# Patient Record
Sex: Male | Born: 1950 | Race: Black or African American | Hispanic: No | Marital: Single | Smoking: Former smoker
Health system: Southern US, Community
[De-identification: ages and names within clinical notes are randomized; demographics above are authoritative.]

## PROBLEM LIST (undated history)

## (undated) ENCOUNTER — Emergency Department (HOSPITAL_COMMUNITY): Admission: EM | Payer: Medicare HMO | Source: Home / Self Care

## (undated) DIAGNOSIS — N183 Chronic kidney disease, stage 3 unspecified: Secondary | ICD-10-CM

## (undated) DIAGNOSIS — A499 Bacterial infection, unspecified: Secondary | ICD-10-CM

## (undated) DIAGNOSIS — I10 Essential (primary) hypertension: Secondary | ICD-10-CM

## (undated) DIAGNOSIS — K9423 Gastrostomy malfunction: Secondary | ICD-10-CM

## (undated) DIAGNOSIS — L89153 Pressure ulcer of sacral region, stage 3: Secondary | ICD-10-CM

## (undated) DIAGNOSIS — J690 Pneumonitis due to inhalation of food and vomit: Secondary | ICD-10-CM

## (undated) DIAGNOSIS — N189 Chronic kidney disease, unspecified: Secondary | ICD-10-CM

## (undated) DIAGNOSIS — Z1624 Resistance to multiple antibiotics: Secondary | ICD-10-CM

## (undated) DIAGNOSIS — N182 Chronic kidney disease, stage 2 (mild): Secondary | ICD-10-CM

## (undated) DIAGNOSIS — D631 Anemia in chronic kidney disease: Secondary | ICD-10-CM

## (undated) DIAGNOSIS — A048 Other specified bacterial intestinal infections: Secondary | ICD-10-CM

## (undated) DIAGNOSIS — A498 Other bacterial infections of unspecified site: Secondary | ICD-10-CM

## (undated) DIAGNOSIS — I639 Cerebral infarction, unspecified: Secondary | ICD-10-CM

## (undated) DIAGNOSIS — M6282 Rhabdomyolysis: Secondary | ICD-10-CM

## (undated) DIAGNOSIS — N390 Urinary tract infection, site not specified: Secondary | ICD-10-CM

## (undated) DIAGNOSIS — S270XXA Traumatic pneumothorax, initial encounter: Secondary | ICD-10-CM

## (undated) DIAGNOSIS — J189 Pneumonia, unspecified organism: Secondary | ICD-10-CM

## (undated) DIAGNOSIS — J810 Acute pulmonary edema: Secondary | ICD-10-CM

## (undated) DIAGNOSIS — Z931 Gastrostomy status: Secondary | ICD-10-CM

## (undated) DIAGNOSIS — Z86010 Personal history of colonic polyps: Secondary | ICD-10-CM

## (undated) DIAGNOSIS — N289 Disorder of kidney and ureter, unspecified: Secondary | ICD-10-CM

## (undated) DIAGNOSIS — N179 Acute kidney failure, unspecified: Secondary | ICD-10-CM

## (undated) DIAGNOSIS — K9429 Other complications of gastrostomy: Secondary | ICD-10-CM

## (undated) HISTORY — PX: KNEE SURGERY: SHX244

## (undated) HISTORY — PX: TRACHEOSTOMY TUBE PLACEMENT: SHX814

---

## 2012-09-21 DIAGNOSIS — A048 Other specified bacterial intestinal infections: Secondary | ICD-10-CM | POA: Insufficient documentation

## 2012-09-21 HISTORY — DX: Other specified bacterial intestinal infections: A04.8

## 2013-07-28 DIAGNOSIS — I10 Essential (primary) hypertension: Secondary | ICD-10-CM

## 2013-07-28 DIAGNOSIS — E782 Mixed hyperlipidemia: Secondary | ICD-10-CM | POA: Insufficient documentation

## 2014-12-23 DIAGNOSIS — Z720 Tobacco use: Secondary | ICD-10-CM

## 2015-07-28 DIAGNOSIS — Z860101 Personal history of adenomatous and serrated colon polyps: Secondary | ICD-10-CM

## 2015-07-28 DIAGNOSIS — Z86010 Personal history of colonic polyps: Secondary | ICD-10-CM

## 2015-07-28 HISTORY — DX: Personal history of colonic polyps: Z86.010

## 2015-07-28 HISTORY — DX: Personal history of adenomatous and serrated colon polyps: Z86.0101

## 2016-01-07 DIAGNOSIS — G8929 Other chronic pain: Secondary | ICD-10-CM | POA: Insufficient documentation

## 2016-01-07 DIAGNOSIS — M25562 Pain in left knee: Secondary | ICD-10-CM

## 2016-01-07 DIAGNOSIS — M25561 Pain in right knee: Secondary | ICD-10-CM

## 2016-10-15 DIAGNOSIS — M6282 Rhabdomyolysis: Secondary | ICD-10-CM

## 2016-10-15 HISTORY — DX: Rhabdomyolysis: M62.82

## 2016-10-19 ENCOUNTER — Encounter (HOSPITAL_COMMUNITY): Payer: Self-pay | Admitting: Internal Medicine

## 2016-10-19 ENCOUNTER — Inpatient Hospital Stay (HOSPITAL_COMMUNITY)
Admission: AD | Admit: 2016-10-19 | Discharge: 2016-11-29 | DRG: 004 | Disposition: A | Discharge status: Skilled Nursing Facility | Payer: Medicare HMO | Source: Other Acute Inpatient Hospital | Attending: Family Medicine | Admitting: Family Medicine

## 2016-10-19 ENCOUNTER — Inpatient Hospital Stay (HOSPITAL_COMMUNITY): Payer: Medicare HMO

## 2016-10-19 DIAGNOSIS — I6350 Cerebral infarction due to unspecified occlusion or stenosis of unspecified cerebral artery: Secondary | ICD-10-CM

## 2016-10-19 DIAGNOSIS — R001 Bradycardia, unspecified: Secondary | ICD-10-CM | POA: Diagnosis not present

## 2016-10-19 DIAGNOSIS — R471 Dysarthria and anarthria: Secondary | ICD-10-CM | POA: Diagnosis present

## 2016-10-19 DIAGNOSIS — Z23 Encounter for immunization: Secondary | ICD-10-CM | POA: Diagnosis present

## 2016-10-19 DIAGNOSIS — J9600 Acute respiratory failure, unspecified whether with hypoxia or hypercapnia: Secondary | ICD-10-CM

## 2016-10-19 DIAGNOSIS — G9349 Other encephalopathy: Secondary | ICD-10-CM | POA: Diagnosis present

## 2016-10-19 DIAGNOSIS — R0682 Tachypnea, not elsewhere classified: Secondary | ICD-10-CM

## 2016-10-19 DIAGNOSIS — Z9911 Dependence on respirator [ventilator] status: Secondary | ICD-10-CM | POA: Diagnosis not present

## 2016-10-19 DIAGNOSIS — L8990 Pressure ulcer of unspecified site, unspecified stage: Secondary | ICD-10-CM | POA: Insufficient documentation

## 2016-10-19 DIAGNOSIS — D62 Acute posthemorrhagic anemia: Secondary | ICD-10-CM | POA: Diagnosis not present

## 2016-10-19 DIAGNOSIS — D473 Essential (hemorrhagic) thrombocythemia: Secondary | ICD-10-CM | POA: Diagnosis not present

## 2016-10-19 DIAGNOSIS — R509 Fever, unspecified: Secondary | ICD-10-CM | POA: Diagnosis not present

## 2016-10-19 DIAGNOSIS — I63531 Cerebral infarction due to unspecified occlusion or stenosis of right posterior cerebral artery: Secondary | ICD-10-CM | POA: Diagnosis not present

## 2016-10-19 DIAGNOSIS — D72829 Elevated white blood cell count, unspecified: Secondary | ICD-10-CM | POA: Diagnosis not present

## 2016-10-19 DIAGNOSIS — I10 Essential (primary) hypertension: Secondary | ICD-10-CM | POA: Diagnosis present

## 2016-10-19 DIAGNOSIS — T17908A Unspecified foreign body in respiratory tract, part unspecified causing other injury, initial encounter: Secondary | ICD-10-CM

## 2016-10-19 DIAGNOSIS — I253 Aneurysm of heart: Secondary | ICD-10-CM | POA: Diagnosis present

## 2016-10-19 DIAGNOSIS — R6521 Severe sepsis with septic shock: Secondary | ICD-10-CM | POA: Diagnosis not present

## 2016-10-19 DIAGNOSIS — E1122 Type 2 diabetes mellitus with diabetic chronic kidney disease: Secondary | ICD-10-CM | POA: Diagnosis present

## 2016-10-19 DIAGNOSIS — A419 Sepsis, unspecified organism: Secondary | ICD-10-CM | POA: Diagnosis not present

## 2016-10-19 DIAGNOSIS — J690 Pneumonitis due to inhalation of food and vomit: Secondary | ICD-10-CM | POA: Diagnosis present

## 2016-10-19 DIAGNOSIS — R1310 Dysphagia, unspecified: Secondary | ICD-10-CM | POA: Diagnosis present

## 2016-10-19 DIAGNOSIS — E870 Hyperosmolality and hypernatremia: Secondary | ICD-10-CM | POA: Diagnosis present

## 2016-10-19 DIAGNOSIS — E119 Type 2 diabetes mellitus without complications: Secondary | ICD-10-CM

## 2016-10-19 DIAGNOSIS — F1020 Alcohol dependence, uncomplicated: Secondary | ICD-10-CM | POA: Diagnosis present

## 2016-10-19 DIAGNOSIS — R7989 Other specified abnormal findings of blood chemistry: Secondary | ICD-10-CM | POA: Diagnosis not present

## 2016-10-19 DIAGNOSIS — R000 Tachycardia, unspecified: Secondary | ICD-10-CM | POA: Diagnosis not present

## 2016-10-19 DIAGNOSIS — I6340 Cerebral infarction due to embolism of unspecified cerebral artery: Secondary | ICD-10-CM | POA: Diagnosis not present

## 2016-10-19 DIAGNOSIS — I63532 Cerebral infarction due to unspecified occlusion or stenosis of left posterior cerebral artery: Secondary | ICD-10-CM | POA: Diagnosis not present

## 2016-10-19 DIAGNOSIS — N183 Chronic kidney disease, stage 3 (moderate): Secondary | ICD-10-CM | POA: Diagnosis present

## 2016-10-19 DIAGNOSIS — R1110 Vomiting, unspecified: Secondary | ICD-10-CM

## 2016-10-19 DIAGNOSIS — I639 Cerebral infarction, unspecified: Secondary | ICD-10-CM | POA: Diagnosis present

## 2016-10-19 DIAGNOSIS — Z7189 Other specified counseling: Secondary | ICD-10-CM | POA: Diagnosis not present

## 2016-10-19 DIAGNOSIS — R739 Hyperglycemia, unspecified: Secondary | ICD-10-CM | POA: Diagnosis not present

## 2016-10-19 DIAGNOSIS — E1165 Type 2 diabetes mellitus with hyperglycemia: Secondary | ICD-10-CM | POA: Diagnosis present

## 2016-10-19 DIAGNOSIS — J9601 Acute respiratory failure with hypoxia: Secondary | ICD-10-CM | POA: Diagnosis not present

## 2016-10-19 DIAGNOSIS — K7200 Acute and subacute hepatic failure without coma: Secondary | ICD-10-CM | POA: Diagnosis not present

## 2016-10-19 DIAGNOSIS — R0603 Acute respiratory distress: Secondary | ICD-10-CM

## 2016-10-19 DIAGNOSIS — Z8673 Personal history of transient ischemic attack (TIA), and cerebral infarction without residual deficits: Secondary | ICD-10-CM | POA: Diagnosis present

## 2016-10-19 DIAGNOSIS — N182 Chronic kidney disease, stage 2 (mild): Secondary | ICD-10-CM

## 2016-10-19 DIAGNOSIS — N179 Acute kidney failure, unspecified: Secondary | ICD-10-CM | POA: Diagnosis present

## 2016-10-19 DIAGNOSIS — J189 Pneumonia, unspecified organism: Secondary | ICD-10-CM

## 2016-10-19 DIAGNOSIS — R0602 Shortness of breath: Secondary | ICD-10-CM

## 2016-10-19 DIAGNOSIS — G9341 Metabolic encephalopathy: Secondary | ICD-10-CM | POA: Diagnosis not present

## 2016-10-19 DIAGNOSIS — G8194 Hemiplegia, unspecified affecting left nondominant side: Secondary | ICD-10-CM | POA: Diagnosis present

## 2016-10-19 DIAGNOSIS — R29723 NIHSS score 23: Secondary | ICD-10-CM | POA: Diagnosis not present

## 2016-10-19 DIAGNOSIS — M6282 Rhabdomyolysis: Secondary | ICD-10-CM

## 2016-10-19 DIAGNOSIS — Z833 Family history of diabetes mellitus: Secondary | ICD-10-CM | POA: Diagnosis not present

## 2016-10-19 DIAGNOSIS — I771 Stricture of artery: Secondary | ICD-10-CM | POA: Diagnosis present

## 2016-10-19 DIAGNOSIS — D638 Anemia in other chronic diseases classified elsewhere: Secondary | ICD-10-CM | POA: Diagnosis present

## 2016-10-19 DIAGNOSIS — R6520 Severe sepsis without septic shock: Secondary | ICD-10-CM | POA: Diagnosis not present

## 2016-10-19 DIAGNOSIS — I69391 Dysphagia following cerebral infarction: Secondary | ICD-10-CM | POA: Diagnosis not present

## 2016-10-19 DIAGNOSIS — R609 Edema, unspecified: Secondary | ICD-10-CM

## 2016-10-19 DIAGNOSIS — Z9289 Personal history of other medical treatment: Secondary | ICD-10-CM

## 2016-10-19 DIAGNOSIS — Y95 Nosocomial condition: Secondary | ICD-10-CM | POA: Diagnosis present

## 2016-10-19 DIAGNOSIS — Z01818 Encounter for other preprocedural examination: Secondary | ICD-10-CM

## 2016-10-19 DIAGNOSIS — J9690 Respiratory failure, unspecified, unspecified whether with hypoxia or hypercapnia: Secondary | ICD-10-CM

## 2016-10-19 DIAGNOSIS — Z515 Encounter for palliative care: Secondary | ICD-10-CM

## 2016-10-19 DIAGNOSIS — Z452 Encounter for adjustment and management of vascular access device: Secondary | ICD-10-CM

## 2016-10-19 DIAGNOSIS — E784 Other hyperlipidemia: Secondary | ICD-10-CM | POA: Diagnosis not present

## 2016-10-19 DIAGNOSIS — E43 Unspecified severe protein-calorie malnutrition: Secondary | ICD-10-CM | POA: Diagnosis present

## 2016-10-19 DIAGNOSIS — Z7401 Bed confinement status: Secondary | ICD-10-CM

## 2016-10-19 DIAGNOSIS — R740 Nonspecific elevation of levels of transaminase and lactic acid dehydrogenase [LDH]: Secondary | ICD-10-CM | POA: Diagnosis present

## 2016-10-19 DIAGNOSIS — K5900 Constipation, unspecified: Secondary | ICD-10-CM

## 2016-10-19 DIAGNOSIS — L89312 Pressure ulcer of right buttock, stage 2: Secondary | ICD-10-CM | POA: Diagnosis present

## 2016-10-19 DIAGNOSIS — K567 Ileus, unspecified: Secondary | ICD-10-CM

## 2016-10-19 DIAGNOSIS — I129 Hypertensive chronic kidney disease with stage 1 through stage 4 chronic kidney disease, or unspecified chronic kidney disease: Secondary | ICD-10-CM | POA: Diagnosis present

## 2016-10-19 DIAGNOSIS — R410 Disorientation, unspecified: Secondary | ICD-10-CM | POA: Diagnosis present

## 2016-10-19 DIAGNOSIS — I6502 Occlusion and stenosis of left vertebral artery: Secondary | ICD-10-CM | POA: Diagnosis present

## 2016-10-19 DIAGNOSIS — R54 Age-related physical debility: Secondary | ICD-10-CM | POA: Diagnosis present

## 2016-10-19 DIAGNOSIS — K219 Gastro-esophageal reflux disease without esophagitis: Secondary | ICD-10-CM | POA: Diagnosis present

## 2016-10-19 DIAGNOSIS — E785 Hyperlipidemia, unspecified: Secondary | ICD-10-CM | POA: Diagnosis present

## 2016-10-19 DIAGNOSIS — E860 Dehydration: Secondary | ICD-10-CM | POA: Diagnosis not present

## 2016-10-19 DIAGNOSIS — Z720 Tobacco use: Secondary | ICD-10-CM

## 2016-10-19 DIAGNOSIS — I6312 Cerebral infarction due to embolism of basilar artery: Secondary | ICD-10-CM | POA: Diagnosis not present

## 2016-10-19 DIAGNOSIS — I638 Other cerebral infarction: Secondary | ICD-10-CM | POA: Diagnosis not present

## 2016-10-19 DIAGNOSIS — F17200 Nicotine dependence, unspecified, uncomplicated: Secondary | ICD-10-CM | POA: Diagnosis present

## 2016-10-19 HISTORY — DX: Cerebral infarction, unspecified: I63.9

## 2016-10-19 HISTORY — DX: Acute kidney failure, unspecified: N17.9

## 2016-10-19 HISTORY — DX: Essential (primary) hypertension: I10

## 2016-10-19 HISTORY — DX: Rhabdomyolysis: M62.82

## 2016-10-19 LAB — COMPREHENSIVE METABOLIC PANEL
ALT: 47 U/L (ref 17–63)
AST: 57 U/L — ABNORMAL HIGH (ref 15–41)
Albumin: 2.1 g/dL — ABNORMAL LOW (ref 3.5–5.0)
Alkaline Phosphatase: 77 U/L (ref 38–126)
Anion gap: 8 (ref 5–15)
BUN: 17 mg/dL (ref 6–20)
CO2: 21 mmol/L — ABNORMAL LOW (ref 22–32)
Calcium: 9.2 mg/dL (ref 8.9–10.3)
Chloride: 109 mmol/L (ref 101–111)
Creatinine, Ser: 1.8 mg/dL — ABNORMAL HIGH (ref 0.61–1.24)
GFR calc Af Amer: 44 mL/min — ABNORMAL LOW (ref >60)
GFR calc non Af Amer: 38 mL/min — ABNORMAL LOW (ref >60)
Glucose, Bld: 96 mg/dL (ref 65–99)
Potassium: 3.8 mmol/L (ref 3.5–5.1)
Sodium: 138 mmol/L (ref 135–145)
Total Bilirubin: 1.8 mg/dL — ABNORMAL HIGH (ref 0.3–1.2)
Total Protein: 6.3 g/dL — ABNORMAL LOW (ref 6.5–8.1)

## 2016-10-19 LAB — LIPID PANEL
Cholesterol: 83 mg/dL (ref 0–200)
HDL: 24 mg/dL — ABNORMAL LOW (ref >40)
LDL Cholesterol: 41 mg/dL (ref 0–99)
Total CHOL/HDL Ratio: 3.5 RATIO
Triglycerides: 89 mg/dL (ref <150)
VLDL: 18 mg/dL (ref 0–40)

## 2016-10-19 LAB — CBC WITH DIFFERENTIAL/PLATELET
Basophils Absolute: 0 10*3/uL (ref 0.0–0.1)
Basophils Relative: 0 %
Eosinophils Absolute: 0.1 10*3/uL (ref 0.0–0.7)
Eosinophils Relative: 0 %
HCT: 32.2 % — ABNORMAL LOW (ref 39.0–52.0)
Hemoglobin: 10.6 g/dL — ABNORMAL LOW (ref 13.0–17.0)
Lymphocytes Relative: 8 %
Lymphs Abs: 1.5 10*3/uL (ref 0.7–4.0)
MCH: 29 pg (ref 26.0–34.0)
MCHC: 32.9 g/dL (ref 30.0–36.0)
MCV: 88 fL (ref 78.0–100.0)
Monocytes Absolute: 2.1 10*3/uL — ABNORMAL HIGH (ref 0.1–1.0)
Monocytes Relative: 11 %
Neutro Abs: 14.9 10*3/uL — ABNORMAL HIGH (ref 1.7–7.7)
Neutrophils Relative %: 81 %
Platelets: 239 10*3/uL (ref 150–400)
RBC: 3.66 MIL/uL — ABNORMAL LOW (ref 4.22–5.81)
RDW: 15.2 % (ref 11.5–15.5)
WBC: 18.5 10*3/uL — ABNORMAL HIGH (ref 4.0–10.5)

## 2016-10-19 LAB — CK: Total CK: 199 U/L (ref 49–397)

## 2016-10-19 MED ORDER — HYDRALAZINE HCL 20 MG/ML IJ SOLN: 10.0000 mg | INTRAMUSCULAR | Status: DC | PRN

## 2016-10-19 MED ORDER — ASPIRIN 325 MG PO TABS: 325.0000 mg | ORAL_TABLET | Freq: Every day | ORAL | Status: DC

## 2016-10-19 MED ORDER — SODIUM CHLORIDE 0.9 % IV SOLN
INTRAVENOUS | Status: DC
  Administered 2016-10-19: 21:00:00 via INTRAVENOUS

## 2016-10-19 MED ORDER — DEXTROSE 5 % IV SOLN
2.0000 g | Freq: Once | INTRAVENOUS | Status: DC
  Filled 2016-10-19: qty 2

## 2016-10-19 MED ORDER — ACETAMINOPHEN 650 MG RE SUPP
650.0000 mg | RECTAL | Status: DC | PRN
  Administered 2016-10-29 – 2016-11-06 (×2): 650 mg via RECTAL
  Filled 2016-10-19 (×3): qty 1

## 2016-10-19 MED ORDER — CEFEPIME HCL 1 G IJ SOLR
1.0000 g | Freq: Three times a day (TID) | INTRAMUSCULAR | Status: DC
  Administered 2016-10-19: 1 g via INTRAVENOUS
  Filled 2016-10-19 (×3): qty 1

## 2016-10-19 MED ORDER — ACETAMINOPHEN 160 MG/5ML PO SOLN
650.0000 mg | ORAL | Status: DC | PRN
  Administered 2016-10-21 – 2016-11-26 (×13): 650 mg
  Filled 2016-10-19 (×16): qty 20.3

## 2016-10-19 MED ORDER — VANCOMYCIN HCL 10 G IV SOLR
1500.0000 mg | Freq: Once | INTRAVENOUS | Status: DC
  Filled 2016-10-19: qty 1500

## 2016-10-19 MED ORDER — VANCOMYCIN HCL IN DEXTROSE 750-5 MG/150ML-% IV SOLN
750.0000 mg | Freq: Two times a day (BID) | INTRAVENOUS | Status: DC
  Administered 2016-10-19: 750 mg via INTRAVENOUS
  Filled 2016-10-19 (×2): qty 150

## 2016-10-19 MED ORDER — STROKE: EARLY STAGES OF RECOVERY BOOK
Freq: Once | Status: DC
  Filled 2016-10-19: qty 1

## 2016-10-19 MED ORDER — ACETAMINOPHEN 325 MG PO TABS: 650.0000 mg | ORAL_TABLET | ORAL | Status: DC | PRN

## 2016-10-19 MED ORDER — ASPIRIN 300 MG RE SUPP
300.0000 mg | Freq: Every day | RECTAL | Status: DC
  Administered 2016-10-19 – 2016-10-20 (×2): 300 mg via RECTAL
  Filled 2016-10-19: qty 1
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
## 2016-10-19 NOTE — Progress Notes (Signed)
Pharmacy Antibiotic Note  Scott Rivera is a 66 y.o. male transferred from [facility] on 10/19/2016 with PNA.  Pharmacy has been consulted for vancomycin and cefepime dosing.  Spoke to pharmacist at [facility], patient received Zosyn 3.375gm IV at 1440 and vancomycin 1gm IV at 0900 today.  His SCr is 1.48, estimated CrCL ~51 ml/min.   Plan: - Vanc 750mg  IV Q12H - Cefepime 1gm IV Q8H - Monitor renal fxn, clinical progress, vanc trough as indicated   Height: 5\' 11"  (180.3 cm) Weight: 162 lb 4.1 oz (73.6 kg) IBW/kg (Calculated) : 75.3  Temp (24hrs), Avg:98.5 F (36.9 C), Min:98.5 F (36.9 C), Max:98.5 F (36.9 C)  No results for input(s): WBC, CREATININE, LATICACIDVEN, VANCOTROUGH, VANCOPEAK, VANCORANDOM, GENTTROUGH, GENTPEAK, GENTRANDOM, TOBRATROUGH, TOBRAPEAK, TOBRARND, AMIKACINPEAK, AMIKACINTROU, AMIKACIN in the last 168 hours.  CrCl cannot be calculated (No order found.).    No Known Allergies   Vanc 7/5 >> Cefepime 7/5 >>    Ajwa Kimberley D. Mina Marble, PharmD, BCPS Pager:  [phone] 10/19/2016, 9:23 PM

## 2016-10-20 ENCOUNTER — Inpatient Hospital Stay (HOSPITAL_COMMUNITY): Payer: Medicare HMO

## 2016-10-20 DIAGNOSIS — L8990 Pressure ulcer of unspecified site, unspecified stage: Secondary | ICD-10-CM | POA: Insufficient documentation

## 2016-10-20 LAB — URINALYSIS, MICROSCOPIC (REFLEX)
Squamous Epithelial / LPF: NONE SEEN
WBC, UA: NONE SEEN WBC/hpf (ref 0–5)

## 2016-10-20 LAB — MRSA PCR SCREENING: MRSA by PCR: NEGATIVE

## 2016-10-20 LAB — RAPID URINE DRUG SCREEN, HOSP PERFORMED
Amphetamines: NOT DETECTED
Barbiturates: NOT DETECTED
Benzodiazepines: NOT DETECTED
Cocaine: NOT DETECTED
Opiates: NOT DETECTED
Tetrahydrocannabinol: NOT DETECTED

## 2016-10-20 LAB — GLUCOSE, CAPILLARY
Glucose-Capillary: 82 mg/dL (ref 65–99)
Glucose-Capillary: 104 mg/dL — ABNORMAL HIGH (ref 65–99)
Glucose-Capillary: 120 mg/dL — ABNORMAL HIGH (ref 65–99)

## 2016-10-20 LAB — URINALYSIS, ROUTINE W REFLEX MICROSCOPIC
Bilirubin Urine: NEGATIVE
Glucose, UA: NEGATIVE mg/dL
Ketones, ur: NEGATIVE mg/dL
Leukocytes, UA: NEGATIVE
Nitrite: NEGATIVE
Protein, ur: 30 mg/dL — AB
Specific Gravity, Urine: 1.013 (ref 1.005–1.030)
pH: 5 (ref 5.0–8.0)

## 2016-10-20 MED ORDER — CHLORHEXIDINE GLUCONATE 0.12 % MT SOLN
15.0000 mL | Freq: Two times a day (BID) | OROMUCOSAL | Status: DC
  Administered 2016-10-20 – 2016-10-28 (×17): 15 mL via OROMUCOSAL
  Filled 2016-10-20 (×16): qty 15

## 2016-10-20 MED ORDER — WHITE PETROLATUM GEL
Status: AC
  Administered 2016-10-20: 04:00:00
  Filled 2016-10-20: qty 1

## 2016-10-20 MED ORDER — WHITE PETROLATUM GEL
Status: AC
  Administered 2016-10-20: 1
  Filled 2016-10-20: qty 1

## 2016-10-20 MED ORDER — PNEUMOCOCCAL VAC POLYVALENT 25 MCG/0.5ML IJ INJ
0.5000 mL | INJECTION | INTRAMUSCULAR | Status: AC
Start: 2016-10-21 — End: 2016-10-21
  Administered 2016-10-21: 0.5 mL via INTRAMUSCULAR
  Filled 2016-10-20: qty 0.5

## 2016-10-20 MED ORDER — JEVITY 1.2 CAL PO LIQD
1000.0000 mL | ORAL | Status: DC
  Administered 2016-10-20 – 2016-10-28 (×4): 1000 mL
  Filled 2016-10-20 (×20): qty 1000

## 2016-10-20 MED ORDER — THIAMINE HCL 100 MG/ML IJ SOLN
100.0000 mg | Freq: Every day | INTRAMUSCULAR | Status: DC
  Administered 2016-10-20 – 2016-10-25 (×6): 100 mg via INTRAVENOUS
  Administered 2016-10-26: 200 mg via INTRAVENOUS
  Administered 2016-10-27 – 2016-11-06 (×11): 100 mg via INTRAVENOUS
  Filled 2016-10-20 (×3): qty 2
  Filled 2016-10-20 (×2): qty 1
  Filled 2016-10-20: qty 2
  Filled 2016-10-20 (×3): qty 1
  Filled 2016-10-20 (×3): qty 2
  Filled 2016-10-20 (×3): qty 1
  Filled 2016-10-20: qty 2
  Filled 2016-10-20: qty 1
  Filled 2016-10-20: qty 2

## 2016-10-20 MED ORDER — SODIUM CHLORIDE 0.9 % IV SOLN
INTRAVENOUS | Status: DC
  Administered 2016-10-21: 03:00:00 via INTRAVENOUS

## 2016-10-20 MED ORDER — ORAL CARE MOUTH RINSE
15.0000 mL | Freq: Two times a day (BID) | OROMUCOSAL | Status: DC
  Administered 2016-10-20 – 2016-10-28 (×16): 15 mL via OROMUCOSAL

## 2016-10-20 MED ORDER — IOPAMIDOL (ISOVUE-370) INJECTION 76%
INTRAVENOUS | Status: AC
  Filled 2016-10-20: qty 50

## 2016-10-20 MED ORDER — NICOTINE 14 MG/24HR TD PT24
14.0000 mg | MEDICATED_PATCH | Freq: Every day | TRANSDERMAL | Status: DC
  Administered 2016-10-20 – 2016-11-28 (×39): 14 mg via TRANSDERMAL
  Filled 2016-10-20 (×40): qty 1

## 2016-10-20 MED ORDER — DEXTROSE 5 % IV SOLN
1.0000 g | Freq: Two times a day (BID) | INTRAVENOUS | Status: DC
  Administered 2016-10-20 – 2016-10-24 (×9): 1 g via INTRAVENOUS
  Filled 2016-10-20 (×11): qty 1

## 2016-10-20 MED ORDER — IOPAMIDOL (ISOVUE-370) INJECTION 76%
INTRAVENOUS | Status: AC
  Administered 2016-10-20: 50 mL
  Filled 2016-10-20: qty 50

## 2016-10-20 MED ORDER — ASPIRIN 325 MG PO TABS
325.0000 mg | ORAL_TABLET | Freq: Every day | ORAL | Status: DC
  Administered 2016-10-21 – 2016-11-01 (×11): 325 mg
  Filled 2016-10-20 (×13): qty 1
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
## 2016-10-20 NOTE — Progress Notes (Signed)
Pharmacy Antibiotic Note  Scott Rivera is a 66 y.o. male transferred from [facility] on 10/19/2016 with PNA.  Pharmacy has been consulted for vancomycin and cefepime dosing.  Spoke to pharmacist at [facility], patient received Zosyn 3.375gm IV at 1440 and vancomycin 1gm IV at 0900 today.  Renal function decreased.  Plan: - Vanc 750mg  IV Q12H - adjust Cefepime 1gm IV Q12h - Monitor renal fxn and adj Vanc if worsens, clinical progress, vanc trough as indicated - f/u labs in AM, CXR   Height: 5\' 11"  (180.3 cm) Weight: 162 lb 4.1 oz (73.6 kg) IBW/kg (Calculated) : 75.3  Temp (24hrs), Avg:99.3 F (37.4 C), Min:98.5 F (36.9 C), Max:100.5 F (38.1 C)   Recent Labs Lab 10/19/16 2157  WBC 18.5*  CREATININE 1.80*    Estimated Creatinine Clearance: 42 mL/min (A) (by C-G formula based on SCr of 1.8 mg/dL (H)).    No Known Allergies   Vanc 7/5 >> Cefepime 7/5 >>    Bertis Ruddy, PharmD Pharmacy Resident Pager #: [phone] 10/20/2016 10:50 AM
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
## 2016-10-20 NOTE — Progress Notes (Signed)
   Introduced chaplaincy services.  Will follow, as needed.  

## 2016-10-20 NOTE — Progress Notes (Addendum)
Initial Nutrition Assessment  DOCUMENTATION CODES:   Severe malnutrition in context of acute illness/injury  INTERVENTION:    Jevity 1.2 at 25 ml/h, increase by 10 ml every 4 hour to goal rate of 75 ml/h (1800 ml per day)  Provides 2160 kcal, 100 gm protein, 1458 ml free water daily  NUTRITION DIAGNOSIS:   Malnutrition related to acute illness as evidenced by severe depletion of body fat, severe depletion of muscle mass, energy intake < or equal to 50% for > or equal to 5 days.  GOAL:   Patient will meet greater than or equal to 90% of their needs  MONITOR:   TF tolerance, Labs, I & O's  REASON FOR ASSESSMENT:   Consult Enteral/tube feeding initiation and management  ASSESSMENT:   66 yo male with PMH of HTN, stroke who was admitted to [place] on 7/1 after family found him on the floor confused. MRI showed multiple acute infarcts. He was transferred to [facility] on 7/5 at family's request.   Patient unable to provide much information, but family member at bedside reports that patient was down on the floor for 4-5 days before the family found him. He was a good eater prior to that time. He was on a pureed diet for 1 day at [facility], then he was made NPO because he failed his swallow evaluation. He has essentially been NPO for 10 days.   S/P bedside swallow evaluation with SLP this morning, patient with profound dysphagia.  Cortrak in place with tip in the duodenum. Received MD Consult for TF initiation and management.   Nutrition-Focused physical exam completed. Findings are severe fat depletion, severe muscle depletion, and mild edema. Usual weight unknown.  Diet Order:  Diet NPO time specified  Skin:  Wound (see comment) (stage II to R buttocks, L shoulder; DTI to heels)  Last BM:  7/5  Height:   Ht Readings from Last 1 Encounters:  10/19/16 5\' 11"  (1.803 m)    Weight:   Wt Readings from Last 1 Encounters:  10/19/16 162 lb 4.1 oz (73.6 kg)    Ideal Body Weight:   78.2 kg  BMI:  Body mass index is 22.63 kg/m.  Estimated Nutritional Needs:   Kcal:  2000-2200  Protein:  100-120 gm  Fluid:  2-2.2 L  EDUCATION NEEDS:   Education needs addressed  Molli Barrows, [place], [place], [place] Pager [phone] After Hours Pager [phone]

## 2016-10-20 NOTE — Progress Notes (Signed)
Approved by Dr. Lars Pinks to give 5mL isovue 370 for CTA head/neck.
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
## 2016-10-20 NOTE — Progress Notes (Signed)
[place] TEAM 1 - Stepdown/ICU TEAM  BAUER AUSBORN  RJJ:[id number] DOB: 1950/11/04 DOA: 10/19/2016 PCP: Patient, No Pcp Per    Brief Narrative:  66 y.o. male with history of HTN who presented to [facility] on 10/15/2016 after family found him on the floor confused. MRI showed multiple acute infarcts involving both hemispheres of the cerebellum, the right occipital lobe. right midbrain, and left thalamus.  The fourth ventricle was patent/no hydrocephalus. TTE noted EF 55-60% w/ normal LV size. Carotid doppler showed right ICA 40-59% stenosis with left 60-79% stenosis. CXR noted infiltrates and patient was febrile therefore the pt was started on antibiotics. Creatinine at admission was 2.6, but this improved to 1.5.  CK of 7000 was noted at the time of admission. Patient's mentation slowly improved.  Family requested transfer to [facility], and this was accomplished on 7/5.  Subjective: The patient is resting in bed with the nutrition team placing a cor tract feeding tube at the time of visit.  He appears to be comfortable.  There is no evidence of respiratory distress.   Assessment & Plan:  Acute stroke Care per neurology/stroke team  Extensive left lung Aspiration pneumonia with possible mucous plugging Continue empiric antibiotic therapy - nebs as needed - chest PT  Dysphagia SLP following - not cleared for intake at this time - place Cortrak - begin tube feeding when patient returns from radiology today  HTN Blood pressure well controlled at present  Rhabdomyolysis CK normalized  Acute renal failure Creatinine peaked at 2.6 - keep hydrated especially with planned IV contrast for today's CT scans  Recent Labs Lab 10/19/16 2157  CREATININE 1.80*    History of alcoholism  Tobacco abuse  DVT prophylaxis: SCDs Code Status: FULL CODE Family Communication: no family present at time of exam  Disposition Plan: SDU  Consultants:   Neurology  Procedures: none  Antimicrobials:  Cefepime 7/4 > Vancomycin 7/5 > 7/6  Objective: Blood pressure (!) 119/55, pulse (!) 106, temperature 99.2 F (37.3 C), temperature source Oral, resp. rate (!) 24, height 5\' 11"  (1.803 m), weight 73.6 kg (162 lb 4.1 oz), SpO2 92 %.  Intake/Output Summary (Last 24 hours) at 10/20/16 1108 Last data filed at 10/20/16 0330  Gross per 24 hour  Intake           131.25 ml  Output              700 ml  Net          -568.75 ml   Filed Weights   10/19/16 2100  Weight: 73.6 kg (162 lb 4.1 oz)    Examination: General: No acute respiratory distress Lungs: Clear to auscultation bilaterally without wheezes or crackles Cardiovascular: Tachycardic but regular without appreciable murmur or rub Abdomen: Nondistended, soft, bowel sounds positive, no rebound, no ascites, no appreciable mass Extremities: No significant cyanosis, clubbing, or edema bilateral lower extremities  CBC:  Recent Labs Lab 10/19/16 2157  WBC 18.5*  NEUTROABS 14.9*  HGB 10.6*  HCT 32.2*  MCV 88.0  PLT 016   Basic Metabolic Panel:  Recent Labs Lab 10/19/16 2157  NA 138  K 3.8  CL 109  CO2 21*  GLUCOSE 96  BUN 17  CREATININE 1.80*  CALCIUM 9.2   GFR: Estimated Creatinine Clearance: 42 mL/min (A) (by C-G formula based on SCr of 1.8 mg/dL (H)).  Liver Function Tests:  Recent Labs Lab 10/19/16 2157  AST 57*  ALT 47  ALKPHOS 77  BILITOT 1.8*  PROT 6.3*  ALBUMIN 2.1*    Cardiac Enzymes:  Recent Labs Lab 10/19/16 2157  CKTOTAL 199    Recent Results (from the past 240 hour(s))  MRSA PCR Screening     Status: None   Collection Time: 10/20/16  4:18 AM  Result Value Ref Range Status   MRSA by PCR NEGATIVE NEGATIVE Final    Comment:        The GeneXpert MRSA Assay (FDA approved for NASAL specimens only), is one component of a comprehensive MRSA colonization surveillance program. It is not intended to diagnose MRSA infection nor to guide  or monitor treatment for MRSA infections.      Scheduled Meds: .  stroke: mapping our early stages of recovery book   Does not apply Once  . aspirin  300 mg Rectal Daily   Or  . aspirin  325 mg Oral Daily  . nicotine  14 mg Transdermal Daily  . thiamine injection  100 mg Intravenous Daily     LOS: 1 day   Cherene Altes, MD [facility] Office  [phone] Pager - Text Page per Amion as per below:  On-Call/Text Page:      [email or web address]      password [email or web address]  If 7PM-7AM, please contact night-coverage [email or web address] Password [email or web address] 10/20/2016, 11:08 AM
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
## 2016-10-20 NOTE — Progress Notes (Signed)
Cortrak Tube Team Note:  Consult received to place a Cortrak feeding tube.   A 10 F Cortrak tube was placed in the L nare and secured with a nasal bridle at 88 cm. Per the Cortrak monitor reading the tube tip is duodenum.   No x-ray is required. RN may begin using tube.   If the tube becomes dislodged please keep the tube and contact the Cortrak team at [email or web address] (password [email or web address]) for replacement.  If after hours and replacement cannot be delayed, place a NG tube and confirm placement with an abdominal x-ray.   Burtis Junes RD, LDN, CNSC Clinical Nutrition Pager: [phone] 10/20/2016 11:57 AM

## 2016-10-21 ENCOUNTER — Inpatient Hospital Stay (HOSPITAL_COMMUNITY): Payer: Medicare HMO

## 2016-10-21 ENCOUNTER — Other Ambulatory Visit (HOSPITAL_COMMUNITY): Payer: Self-pay

## 2016-10-21 LAB — GLUCOSE, CAPILLARY
Glucose-Capillary: 136 mg/dL — ABNORMAL HIGH (ref 65–99)
Glucose-Capillary: 155 mg/dL — ABNORMAL HIGH (ref 65–99)
Glucose-Capillary: 186 mg/dL — ABNORMAL HIGH (ref 65–99)
Glucose-Capillary: 168 mg/dL — ABNORMAL HIGH (ref 65–99)
Glucose-Capillary: 170 mg/dL — ABNORMAL HIGH (ref 65–99)
Glucose-Capillary: 155 mg/dL — ABNORMAL HIGH (ref 65–99)

## 2016-10-21 LAB — COMPREHENSIVE METABOLIC PANEL
ALT: 67 U/L — ABNORMAL HIGH (ref 17–63)
AST: 106 U/L — ABNORMAL HIGH (ref 15–41)
Albumin: 1.9 g/dL — ABNORMAL LOW (ref 3.5–5.0)
Alkaline Phosphatase: 144 U/L — ABNORMAL HIGH (ref 38–126)
Anion gap: 7 (ref 5–15)
BUN: 20 mg/dL (ref 6–20)
CO2: 22 mmol/L (ref 22–32)
Calcium: 9.1 mg/dL (ref 8.9–10.3)
Chloride: 119 mmol/L — ABNORMAL HIGH (ref 101–111)
Creatinine, Ser: 1.53 mg/dL — ABNORMAL HIGH (ref 0.61–1.24)
GFR calc Af Amer: 53 mL/min — ABNORMAL LOW (ref >60)
GFR calc non Af Amer: 46 mL/min — ABNORMAL LOW (ref >60)
Glucose, Bld: 141 mg/dL — ABNORMAL HIGH (ref 65–99)
Potassium: 3.5 mmol/L (ref 3.5–5.1)
Sodium: 148 mmol/L — ABNORMAL HIGH (ref 135–145)
Total Bilirubin: 0.8 mg/dL (ref 0.3–1.2)
Total Protein: 6.7 g/dL (ref 6.5–8.1)

## 2016-10-21 LAB — LIPID PANEL
Cholesterol: 92 mg/dL (ref 0–200)
HDL: 14 mg/dL — ABNORMAL LOW (ref >40)
LDL Cholesterol: 57 mg/dL (ref 0–99)
Total CHOL/HDL Ratio: 6.6 RATIO
Triglycerides: 103 mg/dL (ref <150)
VLDL: 21 mg/dL (ref 0–40)

## 2016-10-21 LAB — IRON AND TIBC
Iron: 32 ug/dL — ABNORMAL LOW (ref 45–182)
Saturation Ratios: 23 % (ref 17.9–39.5)
TIBC: 137 ug/dL — ABNORMAL LOW (ref 250–450)
UIBC: 105 ug/dL

## 2016-10-21 LAB — CBC
HCT: 30 % — ABNORMAL LOW (ref 39.0–52.0)
Hemoglobin: 10 g/dL — ABNORMAL LOW (ref 13.0–17.0)
MCH: 28.8 pg (ref 26.0–34.0)
MCHC: 33.3 g/dL (ref 30.0–36.0)
MCV: 86.5 fL (ref 78.0–100.0)
Platelets: 278 10*3/uL (ref 150–400)
RBC: 3.47 MIL/uL — ABNORMAL LOW (ref 4.22–5.81)
RDW: 15.3 % (ref 11.5–15.5)
WBC: 17.2 10*3/uL — ABNORMAL HIGH (ref 4.0–10.5)

## 2016-10-21 LAB — TSH: TSH: 1.797 u[IU]/mL (ref 0.350–4.500)

## 2016-10-21 LAB — VITAMIN B12: Vitamin B-12: 1127 pg/mL — ABNORMAL HIGH (ref 180–914)

## 2016-10-21 LAB — RETICULOCYTES
RBC.: 3.47 MIL/uL — ABNORMAL LOW (ref 4.22–5.81)
Retic Count, Absolute: 20.8 10*3/uL (ref 19.0–186.0)
Retic Ct Pct: 0.6 % (ref 0.4–3.1)

## 2016-10-21 LAB — HEMOGLOBIN A1C
Hgb A1c MFr Bld: 5.4 % (ref 4.8–5.6)
Mean Plasma Glucose: 108 mg/dL

## 2016-10-21 LAB — ECHOCARDIOGRAM COMPLETE
Height: 71 in
Weight: 2641.99 oz

## 2016-10-21 LAB — FERRITIN: Ferritin: 2299 ng/mL — ABNORMAL HIGH (ref 24–336)

## 2016-10-21 LAB — FOLATE: Folate: 9.2 ng/mL (ref >5.9)

## 2016-10-21 MED ORDER — CLOPIDOGREL BISULFATE 75 MG PO TABS
75.0000 mg | ORAL_TABLET | Freq: Every day | ORAL | Status: DC
  Administered 2016-10-21: 75 mg via ORAL
  Filled 2016-10-21: qty 1

## 2016-10-21 MED ORDER — METOPROLOL TARTRATE 25 MG/10 ML ORAL SUSPENSION
12.5000 mg | Freq: Two times a day (BID) | ORAL | Status: DC
  Administered 2016-10-21 – 2016-10-22 (×2): 12.5 mg
  Filled 2016-10-21 (×2): qty 10

## 2016-10-21 MED ORDER — FREE WATER
200.0000 mL | Freq: Four times a day (QID) | Status: DC
  Administered 2016-10-21 – 2016-10-22 (×5): 200 mL

## 2016-10-21 MED ORDER — PRAVASTATIN SODIUM 20 MG PO TABS
20.0000 mg | ORAL_TABLET | Freq: Every day | ORAL | Status: DC
  Administered 2016-10-22 – 2016-11-28 (×37): 20 mg
  Filled 2016-10-21 (×39): qty 1

## 2016-10-21 MED ORDER — CLOPIDOGREL BISULFATE 75 MG PO TABS
75.0000 mg | ORAL_TABLET | Freq: Every day | ORAL | Status: DC
  Administered 2016-10-22 – 2016-11-02 (×11): 75 mg
  Filled 2016-10-21 (×13): qty 1

## 2016-10-21 NOTE — Progress Notes (Signed)
SLP Cancellation Note  Patient Details Name: Scott Rivera MRN: [id number] DOB: 1951/03/06   Cancelled treatment:       Reason Eval/Treat Not Completed: Medical issues which prohibited therapy. MBS cancelled for today secondary to patient apparently aspirated something and MD wants to keep patient NPO and might intubate patient secondary to decline. MD does not expect patient to be ready for an MBS until at least Monday (10/23/16)    Sonia Baller, [place], [place] 10/21/16 2:20 PM

## 2016-10-21 NOTE — Progress Notes (Signed)
Rehab Admissions Coordinator Note:  Patient was screened by Cleatrice Burke for appropriateness for an Inpatient Acute Rehab Consult per PT and OT recommendations.  At this time, we are recommending Inpatient Rehab consult.  Cleatrice Burke 10/21/2016, 11:30 AM  I can be reached at [phone].

## 2016-10-21 NOTE — Progress Notes (Signed)
Echocardiogram 2D Echocardiogram has been performed.  Scott Rivera 10/21/2016, 1:48 PM

## 2016-10-21 NOTE — Progress Notes (Signed)
STROKE TEAM PROGRESS NOTE   HISTORY OF PRESENT ILLNESS (per record) Scott Rivera is an 66 y.o. male who presented to [facility] on July 1 with complaints of confusion per the family. Last confirmed well was two days prior by the patient's brother. Per H&P note, "MRI showed multiple acute infarcts involving the both hemisphere of the cerebellum and right occipital lobe right midbrain and left thalamus the fourth ventricle was patent no hydrocephalus". Imaging is not present for review at this time. No vascular imaging was performed at [facility]. Patient was transferred to [facility] by request of the family for a second opinion, Dr. Mike Rivera accepted.  Scott Rivera is severely dysarthric and understanding him was very difficult. He does not endorse dizziness, double or blurry vision at this time. When asked what happened, he stated that he was sitting on the side of his bed when he "fell out" three days ago. Further questioning regarding the event elicited "I fell out" repeatedly. Unknown baseline cognitive status at this time. Family was not at bedside, and there are no contacts on file.  Date last known well: Date: 10/13/2016 Time last known well: Unable to determine tPA Given: No: Out of window  SUBJECTIVE (INTERVAL HISTORY) No family members present. Communication difficult due to dysarthria.   OBJECTIVE Temp:  [98.4 F (36.9 C)-99.3 F (37.4 C)] 99 F (37.2 C) (07/07 0341) Pulse Rate:  [99-122] 116 (07/07 0341) Cardiac Rhythm: Sinus tachycardia (07/06 2000) Resp:  [24-36] 31 (07/07 0341) BP: (123-157)/(62-78) 139/62 (07/07 0341) SpO2:  [92 %-95 %] 93 % (07/07 0341) Weight:  [74.9 kg (165 lb 2 oz)] 74.9 kg (165 lb 2 oz) (07/07 0341)  CBC:  Recent Labs Lab 10/19/16 2157 10/21/16 0253  WBC 18.5* 17.2*  NEUTROABS 14.9*  --   HGB 10.6* 10.0*  HCT 32.2* 30.0*  MCV 88.0 86.5  PLT 239 412    Basic Metabolic Panel:  Recent Labs Lab 10/19/16 2157 10/21/16 0253  NA 138  148*  K 3.8 3.5  CL 109 119*  CO2 21* 22  GLUCOSE 96 141*  BUN 17 20  CREATININE 1.80* 1.53*  CALCIUM 9.2 9.1    Lipid Panel:    Component Value Date/Time   CHOL 92 10/21/2016 0253   TRIG 103 10/21/2016 0253   HDL 14 (L) 10/21/2016 0253   CHOLHDL 6.6 10/21/2016 0253   VLDL 21 10/21/2016 0253   LDLCALC 57 10/21/2016 0253   HgbA1c:  Lab Results  Component Value Date   HGBA1C 5.4 10/19/2016   Urine Drug Screen:    Component Value Date/Time   LABOPIA NONE DETECTED 10/19/2016 0050   COCAINSCRNUR NONE DETECTED 10/19/2016 0050   LABBENZ NONE DETECTED 10/19/2016 0050   AMPHETMU NONE DETECTED 10/19/2016 0050   THCU NONE DETECTED 10/19/2016 0050   LABBARB NONE DETECTED 10/19/2016 0050    Alcohol Level No results found for: ETH  IMAGING  Ct Angio Head W Or Wo Contrast Ct Angio Neck W Or Wo Contrast 10/20/2016 1. No acute arterial finding.  2. Advanced left and mild right V1 segment atheromatous stenosis. No visible ulcerated plaque.  3. Motion artifact from swallowing significantly degrades evaluation of the cervical carotids, especially on the left. No noted flow limiting stenosis.  4. Diminished number of left MCA branches in the setting of remote infarct.  5. No visible progression or hemorrhagic conversion of the acute posterior circulation infarcts.  6. Remote left MCA and ACA territory infarcts.     Dg  Chest Port 1 View 10/19/2016 Increasing volume loss in the left hemithorax with progressive left lung opacities. Suspect aspiration/mucous plugging versus increasing pneumonia.    PHYSICAL EXAM Frail middle aged african [place] male in mild resp distress. . Afebrile. Head is nontraumatic. Neck is supple without bruit.    Cardiac exam no murmur or gallop. Lungs are clear to auscultation. Distal pulses are well felt.  Neurological Exam :  Awake alert oriented 2. Severe dysarthria and hence at times difficult to understand. Follows simple commands well. Extraocular  movements are full range without nystagmus. Blinks to threat more on the right than the left. Pupils irregular but reactive. Fundi were not visualized. Left lower facial weakness. Tongue midline. Weak cough and gag. Mild left hemiparesis 4/5. Left upper extremity drift. Left grip and intrinsic hand muscles are weak. Lower extremity strength is symmetric with mild weakness of left hip flexors and ankle dorsiflexors only. Deep tendon reflexes are symmetric. Plantars downgoing. Sensation appears intact. Mild left finger-to-nose dysmetria. Gait was not tested.    ASSESSMENT/PLAN Scott Rivera is a 66 y.o. male with history of previous strokes, hypertension and ongoing tobacco use presenting with severe dysarthria. He did not receive IV t-PA due to late presentation.  Stroke:  Multiple acute infarcts in both posterior cerebral hemispheres - likely symptomatic from high-grade proximal left vertebral artery stenosis.  Resultant  dysarthria and left hemiparesis   CT head evolving bilateral cerebellar, right midbrain and left thalamic infarcts  MRI head - OSH - multiple acute infarcts in both hemispheres  MRA head -not performed  CTA H&N - Diminished number of left MCA branches - remote left MCA and ACA territory infarcts.Advanced left and mild right V1 segment atheromatous stenosis Carotid Doppler - CTA neck Noncalcified plaque causes severe narrowing of the proximal left vertebral artery. No evidence of dissection. No ulceration. Right vertebral artery is mildly narrowed at the V1segment due to atheromatous plaque.   2D Echo - will order  LDL - 24  HgbA1c - 5.4  VTE prophylaxis - SCDs  Diet NPO time specified  No antithrombotic prior to admission, now on aspirin 325 mg daily  Patient counseled to be compliant with his antithrombotic medications  Ongoing aggressive stroke risk factor management  Therapy recommendations: CIR recommended - rehabilitation M.D. consult has been  ordered.  Disposition:  Pending  Hypertension  Stable  Permissive hypertension (OK if < 220/120) but gradually normalize in 5-7 days  Long-term BP goal normotensive  Hyperlipidemia  Home meds: Mevacor 20 mg daily not resumed in hospital  LDL 24, goal < 70  Pt NPO - resume statin when taking POs  Continue statin at discharge    Other Stroke Risk Factors  Advanced age  Cigarette smoker - advised to stop smoking  ETOH use, advised to drink no more than 1 drink per day.  Hx stroke/TIA   Other Active Problems  Aspiration pneumonia - Maxipime started 10/20/16 - leukocytosis  NPO - Jevity TF  At some point will need further workup for embolic source.  Elevated creatinine  Anemia  Hospital day # 2  I have personally examined this patient, reviewed notes, independently viewed imaging studies, participated in medical decision making and plan of care.ROS completed by me personally and pertinent positives fully documented  I have made any additions or clarifications directly to the above note.  Recommend dual antiplatelet therapy for 3 months followed by Plavix alone. Continue parenteral nutrition and medications as patient has severe dysarthria and dysphagia from  his stroke. Continue antibiotics for aspiration pneumonia. Greater than 50% time during this 35 minute visit was spent on counseling and coordination of care about his stroke, discussion about prevention and treatment and answering questions  Antony Contras, MD Medical Director [place] Pager: [phone] 10/21/2016 12:11 PM   To contact Stroke Continuity provider, please refer to [email or web address] After hours, contact General Neurology

## 2016-10-21 NOTE — Progress Notes (Signed)
Scott Rivera TEAM 1 - Stepdown/ICU TEAM  Scott Rivera  FAO:[id number] DOB: 30-Sep-1950 DOA: 10/19/2016 PCP: Patient, No Pcp Per    Brief Narrative:  66 y.o. male with history of HTN who presented to [facility] on 10/15/2016 after family found him on the floor confused. MRI showed multiple acute infarcts involving both hemispheres of the cerebellum, the right occipital lobe. right midbrain, and left thalamus.  The fourth ventricle was patent/no hydrocephalus. TTE noted EF 55-60% w/ normal LV size. Carotid doppler showed right ICA 40-59% stenosis with left 60-79% stenosis. CXR noted infiltrates and patient was febrile therefore the pt was started on antibiotics. Creatinine at admission was 2.6, but this improved to 1.5.  CK of 7000 was noted at the time of admission. Patient's mentation slowly improved.  Family requested transfer to [facility], and this was accomplished on 7/5.  Subjective: The patient is not able to care on a conversation with me due to his severe dysarthria.  He appears somewhat agitated and anxious.  He is a bit tachypneic but does not appear to be in severe respiratory distress.  There is no evidence of uncontrolled pain at this time.  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 - severe dysarthria + L hemiparesis Care per neurology/stroke team - high grade proximal L vert artery stenosis noted on CTa - Neuro suggests "dual antiplatelet therapy for 3 months followed by Plavix alone"  Extensive left lung Aspiration pneumonia with possible mucous plugging Continue empiric antibiotic therapy - nebs as needed - chest PT - saturations greater than 90% without supplemental oxygen requirement - follow-up chest x-ray in a.m.  Dysphagia SLP following - Cortrak in place for tube feeds and medications - suspect patient will need long-term feeding tube given the extent of his deficits  HLD Resume treatment via feeding tube  HTN Blood  pressure climbing - avoid overcorrection and follow for now  Rhabdomyolysis CK normalized  Acute renal failure Creatinine peaked at 2.6 - keep hydrated - creatinine trending down  Recent Labs Lab 10/19/16 2157 10/21/16 0253  CREATININE 1.80* 1.53*    History of alcoholism Patient is nearly beyond the window for even late alcohol withdrawal but is showing evidence of agitation today - follow closely   Tobacco abuse  Severe malnutrition in context of acute illness  DVT prophylaxis: SCDs Code Status: FULL CODE Family Communication: no family present at time of exam  Disposition Plan: SDU  Consultants:  Neurology  Procedures: none  Antimicrobials:  Cefepime 7/4 > Vancomycin 7/5 > 7/6  Objective: Blood pressure (!) 142/71, pulse (!) 111, temperature 98 F (36.7 C), temperature source Axillary, resp. rate (!) 28, height 5\' 11"  (1.803 m), weight 74.9 kg (165 lb 2 oz), SpO2 94 %.  Intake/Output Summary (Last 24 hours) at 10/21/16 1504 Last data filed at 10/21/16 1139  Gross per 24 hour  Intake           2317.5 ml  Output             1800 ml  Net            517.5 ml   Filed Weights   10/19/16 2100 10/21/16 0341  Weight: 73.6 kg (162 lb 4.1 oz) 74.9 kg (165 lb 2 oz)    Examination: General: Tachypneic but in no obvious respiratory distress Lungs: Poor air movement lower left fields with no wheezing and good air movement otherwise Cardiovascular: Tachycardic - regular - no rub Abdomen:  Nondistended, soft, bowel sounds positive, no rebound, no ascites, no appreciable mass Extremities: No significant edema bilateral lower extremities  CBC:  Recent Labs Lab 10/19/16 2157 10/21/16 0253  WBC 18.5* 17.2*  NEUTROABS 14.9*  --   HGB 10.6* 10.0*  HCT 32.2* 30.0*  MCV 88.0 86.5  PLT 239 948   Basic Metabolic Panel:  Recent Labs Lab 10/19/16 2157 10/21/16 0253  NA 138 148*  K 3.8 3.5  CL 109 119*  CO2 21* 22  GLUCOSE 96 141*  BUN 17 20  CREATININE 1.80*  1.53*  CALCIUM 9.2 9.1   GFR: Estimated Creatinine Clearance: 50.3 mL/min (A) (by C-G formula based on SCr of 1.53 mg/dL (H)).  Liver Function Tests:  Recent Labs Lab 10/19/16 2157 10/21/16 0253  AST 57* 106*  ALT 47 67*  ALKPHOS 77 144*  BILITOT 1.8* 0.8  PROT 6.3* 6.7  ALBUMIN 2.1* 1.9*    Cardiac Enzymes:  Recent Labs Lab 10/19/16 2157  CKTOTAL 199    Recent Results (from the past 240 hour(s))  MRSA PCR Screening     Status: None   Collection Time: 10/20/16  4:18 AM  Result Value Ref Range Status   MRSA by PCR NEGATIVE NEGATIVE Final    Comment:        The GeneXpert MRSA Assay (FDA approved for NASAL specimens only), is one component of a comprehensive MRSA colonization surveillance program. It is not intended to diagnose MRSA infection nor to guide or monitor treatment for MRSA infections.      Scheduled Meds: .  stroke: mapping our early stages of recovery book   Does not apply Once  . aspirin  325 mg Per Tube Daily  . chlorhexidine  15 mL Mouth Rinse BID  . clopidogrel  75 mg Oral Daily  . mouth rinse  15 mL Mouth Rinse q12n4p  . nicotine  14 mg Transdermal Daily  . thiamine injection  100 mg Intravenous Daily     LOS: 2 days   Cherene Altes, MD [facility] Office  [phone] Pager - Text Page per Amion as per below:  On-Call/Text Page:      [email or web address]      password [email or web address]  If 7PM-7AM, please contact night-coverage [email or web address] Password [email or web address] 10/21/2016, 3:04 PM

## 2016-10-22 ENCOUNTER — Inpatient Hospital Stay (HOSPITAL_COMMUNITY): Payer: Medicare HMO

## 2016-10-22 LAB — COMPREHENSIVE METABOLIC PANEL
ALT: 75 U/L — ABNORMAL HIGH (ref 17–63)
AST: 78 U/L — ABNORMAL HIGH (ref 15–41)
Albumin: 1.8 g/dL — ABNORMAL LOW (ref 3.5–5.0)
Alkaline Phosphatase: 176 U/L — ABNORMAL HIGH (ref 38–126)
Anion gap: 6 (ref 5–15)
BUN: 17 mg/dL (ref 6–20)
CO2: 21 mmol/L — ABNORMAL LOW (ref 22–32)
Calcium: 9.3 mg/dL (ref 8.9–10.3)
Chloride: 122 mmol/L — ABNORMAL HIGH (ref 101–111)
Creatinine, Ser: 1.4 mg/dL — ABNORMAL HIGH (ref 0.61–1.24)
GFR calc Af Amer: 59 mL/min — ABNORMAL LOW (ref >60)
GFR calc non Af Amer: 51 mL/min — ABNORMAL LOW (ref >60)
Glucose, Bld: 161 mg/dL — ABNORMAL HIGH (ref 65–99)
Potassium: 3.5 mmol/L (ref 3.5–5.1)
Sodium: 149 mmol/L — ABNORMAL HIGH (ref 135–145)
Total Bilirubin: 0.8 mg/dL (ref 0.3–1.2)
Total Protein: 6.1 g/dL — ABNORMAL LOW (ref 6.5–8.1)

## 2016-10-22 LAB — GLUCOSE, CAPILLARY
Glucose-Capillary: 150 mg/dL — ABNORMAL HIGH (ref 65–99)
Glucose-Capillary: 127 mg/dL — ABNORMAL HIGH (ref 65–99)
Glucose-Capillary: 181 mg/dL — ABNORMAL HIGH (ref 65–99)
Glucose-Capillary: 160 mg/dL — ABNORMAL HIGH (ref 65–99)
Glucose-Capillary: 142 mg/dL — ABNORMAL HIGH (ref 65–99)
Glucose-Capillary: 136 mg/dL — ABNORMAL HIGH (ref 65–99)

## 2016-10-22 LAB — CBC
HCT: 30 % — ABNORMAL LOW (ref 39.0–52.0)
Hemoglobin: 9.7 g/dL — ABNORMAL LOW (ref 13.0–17.0)
MCH: 28.1 pg (ref 26.0–34.0)
MCHC: 32.3 g/dL (ref 30.0–36.0)
MCV: 87 fL (ref 78.0–100.0)
Platelets: 284 10*3/uL (ref 150–400)
RBC: 3.45 MIL/uL — ABNORMAL LOW (ref 4.22–5.81)
RDW: 15.4 % (ref 11.5–15.5)
WBC: 21.4 10*3/uL — ABNORMAL HIGH (ref 4.0–10.5)

## 2016-10-22 LAB — HEMOGLOBIN A1C
Hgb A1c MFr Bld: 5.4 % (ref 4.8–5.6)
Mean Plasma Glucose: 108 mg/dL

## 2016-10-22 MED ORDER — FREE WATER
300.0000 mL | Freq: Four times a day (QID) | Status: DC
  Administered 2016-10-23: 300 mL

## 2016-10-22 MED ORDER — METOPROLOL TARTRATE 25 MG/10 ML ORAL SUSPENSION
25.0000 mg | Freq: Two times a day (BID) | ORAL | Status: DC
  Administered 2016-10-22 – 2016-10-28 (×13): 25 mg
  Filled 2016-10-22 (×14): qty 10

## 2016-10-22 NOTE — Progress Notes (Signed)
STROKE TEAM PROGRESS NOTE   HISTORY OF PRESENT ILLNESS (per record) Scott Rivera is an 66 y.o. male who presented to [facility] on July 1 with complaints of confusion per the family. Last confirmed well was two days prior by the patient's brother. Per H&P note, "MRI showed multiple acute infarcts involving the both hemisphere of the cerebellum and right occipital lobe right midbrain and left thalamus the fourth ventricle was patent no hydrocephalus". Imaging is not present for review at this time. No vascular imaging was performed at [facility]. Patient was transferred to [facility] by request of the family for a second opinion, Dr. Mike Craze accepted.  [name] is severely dysarthric and understanding him was very difficult. He does not endorse dizziness, double or blurry vision at this time. When asked what happened, he stated that he was sitting on the side of his bed when he "fell out" three days ago. Further questioning regarding the event elicited "I fell out" repeatedly. Unknown baseline cognitive status at this time. Family was not at bedside, and there are no contacts on file.  Date last known well: Date: 10/13/2016 Time last known well: Unable to determine tPA Given: No: Out of window  SUBJECTIVE (INTERVAL HISTORY) No family members present. Communication difficult due to dysarthria.he is bit tachycardic and tachypnoic but better than y`day.Chest Xray is improved but WBC count is rising   OBJECTIVE Temp:  [98 F (36.7 C)-99.4 F (37.4 C)] 98.5 F (36.9 C) (07/08 0759) Pulse Rate:  [108-123] 108 (07/08 0759) Cardiac Rhythm: Sinus tachycardia (07/08 0759) Resp:  [34-57] 57 (07/08 0759) BP: (142-187)/(69-85) 171/85 (07/08 0759) SpO2:  [93 %-96 %] 94 % (07/08 0759) Weight:  [163 lb 2.3 oz (74 kg)] 163 lb 2.3 oz (74 kg) (07/08 0314)  CBC:   Recent Labs Lab 10/19/16 2157 10/21/16 0253 10/22/16 0147  WBC 18.5* 17.2* 21.4*  NEUTROABS 14.9*  --   --   HGB 10.6* 10.0*  9.7*  HCT 32.2* 30.0* 30.0*  MCV 88.0 86.5 87.0  PLT 239 278 751    Basic Metabolic Panel:   Recent Labs Lab 10/21/16 0253 10/22/16 0147  NA 148* 149*  K 3.5 3.5  CL 119* 122*  CO2 22 21*  GLUCOSE 141* 161*  BUN 20 17  CREATININE 1.53* 1.40*  CALCIUM 9.1 9.3    Lipid Panel:     Component Value Date/Time   CHOL 92 10/21/2016 0253   TRIG 103 10/21/2016 0253   HDL 14 (L) 10/21/2016 0253   CHOLHDL 6.6 10/21/2016 0253   VLDL 21 10/21/2016 0253   LDLCALC 57 10/21/2016 0253   HgbA1c:  Lab Results  Component Value Date   HGBA1C 5.4 10/21/2016   Urine Drug Screen:     Component Value Date/Time   LABOPIA NONE DETECTED 10/19/2016 0050   COCAINSCRNUR NONE DETECTED 10/19/2016 0050   LABBENZ NONE DETECTED 10/19/2016 0050   AMPHETMU NONE DETECTED 10/19/2016 0050   THCU NONE DETECTED 10/19/2016 0050   LABBARB NONE DETECTED 10/19/2016 0050    Alcohol Level No results found for: ETH  IMAGING  Ct Angio Head W Or Wo Contrast Ct Angio Neck W Or Wo Contrast 10/20/2016 1. No acute arterial finding.  2. Advanced left and mild right V1 segment atheromatous stenosis. No visible ulcerated plaque.  3. Motion artifact from swallowing significantly degrades evaluation of the cervical carotids, especially on the left. No noted flow limiting stenosis.  4. Diminished number of left MCA branches in the setting  of remote infarct.  5. No visible progression or hemorrhagic conversion of the acute posterior circulation infarcts.  6. Remote left MCA and ACA territory infarcts.     Dg Chest Port 1 View 10/19/2016 Increasing volume loss in the left hemithorax with progressive left lung opacities. Suspect aspiration/mucous plugging versus increasing pneumonia.    PHYSICAL EXAM Frail middle aged african [place] male in mild resp distress. . Afebrile. Head is nontraumatic. Neck is supple without bruit.    Cardiac exam no murmur or gallop. Lungs are clear to auscultation. Distal pulses are well  felt.  Neurological Exam :  Awake alert oriented 2. Severe dysarthria and hence at times difficult to understand. Follows simple commands well. Extraocular movements are full range without nystagmus. Blinks to threat more on the right than the left. Pupils irregular but reactive. Fundi were not visualized. Left lower facial weakness. Tongue midline. Weak cough and gag. Mild left hemiparesis 4/5. Left upper extremity drift. Left grip and intrinsic hand muscles are weak. Lower extremity strength testing is limited by knee pain and swelling bilaterally but is symmetric with  Diffuse weakness  2-3/5 bilaterally. Deep tendon reflexes are symmetric. Plantars downgoing. Sensation appears intact. Mild left finger-to-nose dysmetria. Gait was not tested.    ASSESSMENT/PLAN [name] is a 66 y.o. male with history of previous strokes, hypertension and ongoing tobacco use presenting with severe dysarthria. He did not receive IV t-PA due to late presentation.  Stroke:  Multiple acute infarcts in both posterior cerebral hemispheres - likely symptomatic from high-grade proximal left vertebral artery stenosis.  Resultant  dysarthria and left hemiparesis   CT head evolving bilateral cerebellar, right midbrain and left thalamic infarcts  MRI head - OSH - multiple acute infarcts in both hemispheres  MRA head -not performed  CTA H&N - Diminished number of left MCA branches - remote left MCA and ACA territory infarcts.Advanced left and mild right V1 segment atheromatous stenosis Carotid Doppler - CTA neck Noncalcified plaque causes severe narrowing of the proximal left vertebral artery. No evidence of dissection. No ulceration. Right vertebral artery is mildly narrowed at the V1segment due to atheromatous plaque.  2D Echo -Left ventricle: The cavity size was normal. Wall thickness was   increased in a pattern of mild LVH. Systolic function was normal.   The estimated ejection fraction was in the range  of 60% to 65%.   Wall motion was normal; there were no regional wall motion    abnormalities.  LDL - 24  HgbA1c - 5.4  VTE prophylaxis - SCDs Diet NPO time specified  No antithrombotic prior to admission, now on aspirin 325 mg daily  Patient counseled to be compliant with his antithrombotic medications  Ongoing aggressive stroke risk factor management  Therapy recommendations: CIR recommended - rehabilitation M.D. consult has been ordered.  Disposition:  Pending  Hypertension  Stable  Permissive hypertension (OK if < 220/120) but gradually normalize in 5-7 days  Long-term BP goal normotensive  Hyperlipidemia  Home meds: Mevacor 20 mg daily not resumed in hospital  LDL 24, goal < 70  Pt NPO - resume statin when taking POs  Continue statin at discharge    Other Stroke Risk Factors  Advanced age  Cigarette smoker - advised to stop smoking  ETOH use, advised to drink no more than 1 drink per day.  Hx stroke/TIA   Other Active Problems  Aspiration pneumonia - Maxipime started 10/20/16 - leukocytosis  NPO - Jevity TF  At some point will  need further workup for embolic source.  Elevated creatinine  Anemia  Hospital day # 3  I have personally examined this patient, reviewed notes, independently viewed imaging studies, participated in medical decision making and plan of care.ROS completed by me personally and pertinent positives fully documented  I have made any additions or clarifications directly to the above note.  Recommend dual antiplatelet therapy for 3 months followed by Plavix alone. Continue parenteral nutrition and medications as patient has severe dysarthria and dysphagia from his stroke. Patient therapy eval and patient does poorly may nightly need early PEG given severe dysarthria and dysphagia. Continue antibiotics for aspiration pneumonia. Greater than 50% time during this 25 minute visit was spent on counseling and coordination of care about his  stroke, discussion about prevention and treatment and answering questions.D/w Dr Thereasa Solo.  Antony Contras, MD Medical Director [facility] Pager: [phone] 10/22/2016 11:17 AM   To contact Stroke Continuity provider, please refer to [email or web address] After hours, contact General Neurology

## 2016-10-22 NOTE — Progress Notes (Signed)
[place] TEAM 1 - Stepdown/ICU TEAM  Scott Rivera  FUX:[id number] DOB: [birth year] DOA: 10/19/2016 PCP: Patient, No Pcp Per    Brief Narrative:  66 y.o. male with history of HTN who presented to [facility] on 10/15/2016 after family found him on the floor confused. MRI showed multiple acute infarcts involving both hemispheres of the cerebellum, the right occipital lobe. right midbrain, and left thalamus.  The fourth ventricle was patent/no hydrocephalus. TTE noted EF 55-60% w/ normal LV size. Carotid doppler showed right ICA 40-59% stenosis with left 60-79% stenosis. CXR noted infiltrates and patient was febrile therefore the pt was started on antibiotics. Creatinine at admission was 2.6, but this improved to 1.5.  CK of 7000 was noted at the time of admission. Patient's mentation slowly improved.  Family requested transfer to [facility], and this was accomplished on 7/5.  Subjective: The pt is breathing more comfortably and looks more calm.  He is sitting up in a bedside chair.  His sister is at the bedside visiting.  He appears to recognize her.  His automatic speech (thanks - yes - no) appears intact today.  He is otherwise not able to speak in a comprehensible sentence.    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 noted on CTa - Neuro suggests "dual antiplatelet therapy for 3 months followed by Plavix alone" - will need extensive PT/OT and SLP  Extensive left lung Aspiration pneumonia with possible mucous plugging Continue empiric antibiotic therapy - nebs as needed - chest PT - saturations greater than 90% without supplemental oxygen - follow-up chest x-ray today is improved   Dysphagia SLP following - Cortrak in place for tube feeds and medications - suspect patient will need long-term feeding tube given the extent of his  deficits - agree w/ Neuro that early PEG indicated unless surprising results noted from f/u SLP eval   HLD Resumed treatment via feeding tube  HTN Blood pressure remains poorly controlled - begin to gently titrate meds - will NOT attempt to normalize at this time   Rhabdomyolysis CK normalized  Acute renal failure Creatinine peaked at 2.6 - continues to improve   Recent Labs Lab 10/19/16 2157 10/21/16 0253 10/22/16 0147  CREATININE 1.80* 1.53* 1.40*    History of alcoholism Patient is nearly beyond the window for even late alcohol withdrawal but is showing evidence of agitation today - follow closely   Mild transaminits  Improving - etiology unclear - check viral hep panel   Tobacco abuse  Severe malnutrition in context of acute illness  DVT prophylaxis: SCDs Code Status: FULL CODE Family Communication: spoke w/ sister at bedside  Disposition Plan: SDU  Consultants:  Neurology  Procedures: none  Antimicrobials:  Cefepime 7/4 > Vancomycin 7/5 > 7/6  Objective: Blood pressure (!) 180/81, pulse (!) 108, temperature 99 F (37.2 C), temperature source Oral, resp. rate (!) 57, height 5\' 11"  (1.803 m), weight 74 kg (163 lb 2.3 oz), SpO2 94 %.  Intake/Output Summary (Last 24 hours) at 10/22/16 1512 Last data filed at 10/22/16 1128  Gross per 24 hour  Intake             2433 ml  Output             2750 ml  Net             -317  ml   Filed Weights   10/19/16 2100 10/21/16 0341 10/22/16 0314  Weight: 73.6 kg (162 lb 4.1 oz) 74.9 kg (165 lb 2 oz) 74 kg (163 lb 2.3 oz)    Examination: General: respirations calm and comfortable Lungs: mild L basilar crackles - no wheezing  Cardiovascular: RRR w/o M  Abdomen: Nondistended, soft, bowel sounds positive, no rebound, no ascites, no appreciable mass Extremities: No edema B LE   CBC:  Recent Labs Lab 10/19/16 2157 10/21/16 0253 10/22/16 0147  WBC 18.5* 17.2* 21.4*  NEUTROABS 14.9*  --   --   HGB 10.6* 10.0*  9.7*  HCT 32.2* 30.0* 30.0*  MCV 88.0 86.5 87.0  PLT 239 278 562   Basic Metabolic Panel:  Recent Labs Lab 10/19/16 2157 10/21/16 0253 10/22/16 0147  NA 138 148* 149*  K 3.8 3.5 3.5  CL 109 119* 122*  CO2 21* 22 21*  GLUCOSE 96 141* 161*  BUN 17 20 17   CREATININE 1.80* 1.53* 1.40*  CALCIUM 9.2 9.1 9.3   GFR: Estimated Creatinine Clearance: 54.3 mL/min (A) (by C-G formula based on SCr of 1.4 mg/dL (H)).  Liver Function Tests:  Recent Labs Lab 10/19/16 2157 10/21/16 0253 10/22/16 0147  AST 57* 106* 78*  ALT 47 67* 75*  ALKPHOS 77 144* 176*  BILITOT 1.8* 0.8 0.8  PROT 6.3* 6.7 6.1*  ALBUMIN 2.1* 1.9* 1.8*    Cardiac Enzymes:  Recent Labs Lab 10/19/16 2157  CKTOTAL 199    Recent Results (from the past 240 hour(s))  MRSA PCR Screening     Status: None   Collection Time: 10/20/16  4:18 AM  Result Value Ref Range Status   MRSA by PCR NEGATIVE NEGATIVE Final    Comment:        The GeneXpert MRSA Assay (FDA approved for NASAL specimens only), is one component of a comprehensive MRSA colonization surveillance program. It is not intended to diagnose MRSA infection nor to guide or monitor treatment for MRSA infections.      Scheduled Meds: .  stroke: mapping our early stages of recovery book   Does not apply Once  . aspirin  325 mg Per Tube Daily  . chlorhexidine  15 mL Mouth Rinse BID  . clopidogrel  75 mg Per Tube Daily  . free water  200 mL Per Tube Q6H  . mouth rinse  15 mL Mouth Rinse q12n4p  . metoprolol tartrate  12.5 mg Per Tube BID  . nicotine  14 mg Transdermal Daily  . pravastatin  20 mg Per Tube q1800  . thiamine injection  100 mg Intravenous Daily     LOS: 3 days   Cherene Altes, MD [facility] Office  [phone] Pager - Text Page per Amion as per below:  On-Call/Text Page:      [email or web address]      password [email or web address]  If 7PM-7AM, please contact night-coverage [email or web address] Password [email or web address] 10/22/2016, 3:12 PM

## 2016-10-23 DIAGNOSIS — E870 Hyperosmolality and hypernatremia: Secondary | ICD-10-CM

## 2016-10-23 DIAGNOSIS — I639 Cerebral infarction, unspecified: Principal | ICD-10-CM

## 2016-10-23 DIAGNOSIS — R0682 Tachypnea, not elsewhere classified: Secondary | ICD-10-CM

## 2016-10-23 DIAGNOSIS — I69391 Dysphagia following cerebral infarction: Secondary | ICD-10-CM

## 2016-10-23 DIAGNOSIS — D62 Acute posthemorrhagic anemia: Secondary | ICD-10-CM

## 2016-10-23 DIAGNOSIS — N189 Chronic kidney disease, unspecified: Secondary | ICD-10-CM

## 2016-10-23 DIAGNOSIS — Z720 Tobacco use: Secondary | ICD-10-CM

## 2016-10-23 DIAGNOSIS — D72829 Elevated white blood cell count, unspecified: Secondary | ICD-10-CM

## 2016-10-23 DIAGNOSIS — R739 Hyperglycemia, unspecified: Secondary | ICD-10-CM

## 2016-10-23 DIAGNOSIS — I10 Essential (primary) hypertension: Secondary | ICD-10-CM

## 2016-10-23 LAB — COMPREHENSIVE METABOLIC PANEL
ALT: 59 U/L (ref 17–63)
AST: 46 U/L — ABNORMAL HIGH (ref 15–41)
Albumin: 1.9 g/dL — ABNORMAL LOW (ref 3.5–5.0)
Alkaline Phosphatase: 151 U/L — ABNORMAL HIGH (ref 38–126)
Anion gap: 7 (ref 5–15)
BUN: 19 mg/dL (ref 6–20)
CO2: 25 mmol/L (ref 22–32)
Calcium: 9.3 mg/dL (ref 8.9–10.3)
Chloride: 119 mmol/L — ABNORMAL HIGH (ref 101–111)
Creatinine, Ser: 1.26 mg/dL — ABNORMAL HIGH (ref 0.61–1.24)
GFR calc Af Amer: >60 mL/min (ref >60)
GFR calc non Af Amer: 58 mL/min — ABNORMAL LOW (ref >60)
Glucose, Bld: 132 mg/dL — ABNORMAL HIGH (ref 65–99)
Potassium: 3.8 mmol/L (ref 3.5–5.1)
Sodium: 151 mmol/L — ABNORMAL HIGH (ref 135–145)
Total Bilirubin: 0.3 mg/dL (ref 0.3–1.2)
Total Protein: 6.5 g/dL (ref 6.5–8.1)

## 2016-10-23 LAB — CBC
HCT: 30.1 % — ABNORMAL LOW (ref 39.0–52.0)
Hemoglobin: 9.5 g/dL — ABNORMAL LOW (ref 13.0–17.0)
MCH: 28.1 pg (ref 26.0–34.0)
MCHC: 31.6 g/dL (ref 30.0–36.0)
MCV: 89.1 fL (ref 78.0–100.0)
Platelets: 324 10*3/uL (ref 150–400)
RBC: 3.38 MIL/uL — ABNORMAL LOW (ref 4.22–5.81)
RDW: 16 % — ABNORMAL HIGH (ref 11.5–15.5)
WBC: 20.9 10*3/uL — ABNORMAL HIGH (ref 4.0–10.5)

## 2016-10-23 LAB — GLUCOSE, CAPILLARY
Glucose-Capillary: 104 mg/dL — ABNORMAL HIGH (ref 65–99)
Glucose-Capillary: 123 mg/dL — ABNORMAL HIGH (ref 65–99)
Glucose-Capillary: 125 mg/dL — ABNORMAL HIGH (ref 65–99)
Glucose-Capillary: 135 mg/dL — ABNORMAL HIGH (ref 65–99)
Glucose-Capillary: 143 mg/dL — ABNORMAL HIGH (ref 65–99)
Glucose-Capillary: 146 mg/dL — ABNORMAL HIGH (ref 65–99)

## 2016-10-23 MED ORDER — BETHANECHOL CHLORIDE 10 MG PO TABS
10.0000 mg | ORAL_TABLET | Freq: Three times a day (TID) | ORAL | Status: DC
  Administered 2016-10-23 – 2016-11-06 (×39): 10 mg
  Filled 2016-10-23 (×44): qty 1

## 2016-10-23 MED ORDER — FREE WATER
300.0000 mL | Status: DC
Start: 2016-10-23 — End: 2016-10-28
  Administered 2016-10-23 – 2016-10-28 (×21): 300 mL

## 2016-10-23 MED ORDER — SODIUM CHLORIDE 0.45 % IV SOLN
INTRAVENOUS | Status: DC
  Administered 2016-10-23: 13:00:00 via INTRAVENOUS

## 2016-10-23 NOTE — Progress Notes (Signed)
  Speech Language Pathology   Patient Details Name: Scott Rivera MRN: [id number] DOB: November 13, 1950 Today's Date: 10/23/2016 Time:  -       Will plan for MBS tomorrow (likely morning). SLP will call RN 7/10 am.    Cranford [employer] [phone]
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
## 2016-10-23 NOTE — Progress Notes (Signed)
Attempted to call report nurse still unavailable to take report at this time

## 2016-10-23 NOTE — Progress Notes (Signed)
STROKE TEAM PROGRESS NOTE   HISTORY OF PRESENT ILLNESS (per record) Scott Rivera is an 66 y.o. male who presented to [facility] on July 1 with complaints of confusion per the family. Last confirmed well was two days prior by the patient's brother. Per H&P note, "MRI showed multiple acute infarcts involving the both hemisphere of the cerebellum and right occipital lobe right midbrain and left thalamus the fourth ventricle was patent no hydrocephalus". Imaging is not present for review at this time. No vascular imaging was performed at [facility]. Patient was transferred to [facility] by request of the family for a second opinion, Dr. Mike Craze accepted.  Scott Rivera is severely dysarthric and understanding him was very difficult. He does not endorse dizziness, double or blurry vision at this time. When asked what happened, he stated that he was sitting on the side of his bed when he "fell out" three days ago. Further questioning regarding the event elicited "I fell out" repeatedly. Unknown baseline cognitive status at this time. Family was not at bedside, and there are no contacts on file.  Date last known well: Date: 10/13/2016 Time last known well: Unable to determine tPA Given: No: Out of window  SUBJECTIVE (INTERVAL HISTORY) No family members present.  He seems to be doing better today. Breathing easier and less tachycardic. He has been seen by inpatient rehabilitation team and has potential for rehabilitation   OBJECTIVE Temp:  [98.5 F (36.9 C)-99.2 F (37.3 C)] 98.8 F (37.1 C) (07/09 0706) Pulse Rate:  [87-112] 112 (07/09 0706) Cardiac Rhythm: Sinus tachycardia (07/09 0706) Resp:  [26-40] 30 (07/09 0706) BP: (165-178)/(78-98) 165/87 (07/09 0706) SpO2:  [95 %-97 %] 97 % (07/09 0706) Weight:  [166 lb 3.6 oz (75.4 kg)] 166 lb 3.6 oz (75.4 kg) (07/09 0344)  CBC:   Recent Labs Lab 10/19/16 2157  10/22/16 0147 10/23/16 0330  WBC 18.5*  < > 21.4* 20.9*  NEUTROABS 14.9*  --    --   --   HGB 10.6*  < > 9.7* 9.5*  HCT 32.2*  < > 30.0* 30.1*  MCV 88.0  < > 87.0 89.1  PLT 239  < > 284 324  < > = values in this interval not displayed.  Basic Metabolic Panel:   Recent Labs Lab 10/22/16 0147 10/23/16 0330  NA 149* 151*  K 3.5 3.8  CL 122* 119*  CO2 21* 25  GLUCOSE 161* 132*  BUN 17 19  CREATININE 1.40* 1.26*  CALCIUM 9.3 9.3    Lipid Panel:     Component Value Date/Time   CHOL 92 10/21/2016 0253   TRIG 103 10/21/2016 0253   HDL 14 (L) 10/21/2016 0253   CHOLHDL 6.6 10/21/2016 0253   VLDL 21 10/21/2016 0253   LDLCALC 57 10/21/2016 0253   HgbA1c:  Lab Results  Component Value Date   HGBA1C 5.4 10/21/2016   Urine Drug Screen:     Component Value Date/Time   LABOPIA NONE DETECTED 10/19/2016 0050   COCAINSCRNUR NONE DETECTED 10/19/2016 0050   LABBENZ NONE DETECTED 10/19/2016 0050   AMPHETMU NONE DETECTED 10/19/2016 0050   THCU NONE DETECTED 10/19/2016 0050   LABBARB NONE DETECTED 10/19/2016 0050    Alcohol Level No results found for: ETH  IMAGING  Ct Angio Head W Or Wo Contrast Ct Angio Neck W Or Wo Contrast 10/20/2016 1. No acute arterial finding.  2. Advanced left and mild right V1 segment atheromatous stenosis. No visible ulcerated plaque.  3.  Motion artifact from swallowing significantly degrades evaluation of the cervical carotids, especially on the left. No noted flow limiting stenosis.  4. Diminished number of left MCA branches in the setting of remote infarct.  5. No visible progression or hemorrhagic conversion of the acute posterior circulation infarcts.  6. Remote left MCA and ACA territory infarcts.     Dg Chest Port 1 View 10/19/2016 Increasing volume loss in the left hemithorax with progressive left lung opacities. Suspect aspiration/mucous plugging versus increasing pneumonia.    PHYSICAL EXAM Frail middle aged african [place] male in mild resp distress. . Afebrile. Head is nontraumatic. Neck is supple without bruit.     Cardiac exam no murmur or gallop. Lungs are clear to auscultation. Distal pulses are well felt.  Neurological Exam :  Awake alert oriented 2. Severe dysarthria and  at times difficult to understand. Follows simple commands well. Extraocular movements are full range without nystagmus. Blinks to threat more on the right than the left. Pupils irregular but reactive. Fundi were not visualized. Left lower facial weakness. Tongue midline. Weak cough and gag. Mild left hemiparesis 4/5. Left upper extremity drift. Left grip and intrinsic hand muscles are weak. Lower extremity strength testing is limited by knee pain and swelling bilaterally but is symmetric with  Diffuse weakness  2-3/5 bilaterally. Deep tendon reflexes are symmetric. Plantars downgoing. Sensation appears intact. Mild left finger-to-nose dysmetria. Gait was not tested.    ASSESSMENT/PLAN Scott Rivera is a 66 y.o. male with history of previous strokes, hypertension and ongoing tobacco use presenting with severe dysarthria. He did not receive IV t-PA due to late presentation.  Stroke:  Multiple acute infarcts in both posterior cerebral hemispheres - likely symptomatic from high-grade proximal left vertebral artery stenosis.  Resultant  dysarthria and left hemiparesis   CT head evolving bilateral cerebellar, right midbrain and left thalamic infarcts  MRI head - OSH - multiple acute infarcts in both hemispheres  MRA head -not performed  CTA H&N - Diminished number of left MCA branches - remote left MCA and ACA territory infarcts.Advanced left and mild right V1 segment atheromatous stenosis Carotid Doppler - CTA neck Noncalcified plaque causes severe narrowing of the proximal left vertebral artery. No evidence of dissection. No ulceration. Right vertebral artery is mildly narrowed at the V1segment due to atheromatous plaque.  2D Echo -Left ventricle: The cavity size was normal. Wall thickness was   increased in a pattern of mild  LVH. Systolic function was normal.   The estimated ejection fraction was in the range of 60% to 65%.   Wall motion was normal; there were no regional wall motion    abnormalities.  LDL - 24  HgbA1c - 5.4  VTE prophylaxis - SCDs Diet NPO time specified  No antithrombotic prior to admission, now on aspirin 325 mg daily  Patient counseled to be compliant with his antithrombotic medications  Ongoing aggressive stroke risk factor management  Therapy recommendations: CIR recommended - rehabilitation M.D. consult has been ordered.  Disposition:  Pending  Hypertension  Stable  Permissive hypertension (OK if < 220/120) but gradually normalize in 5-7 days  Long-term BP goal normotensive  Hyperlipidemia  Home meds: Mevacor 20 mg daily not resumed in hospital  LDL 24, goal < 70  Pt NPO - resume statin when taking POs  Continue statin at discharge    Other Stroke Risk Factors  Advanced age  Cigarette smoker - advised to stop smoking  ETOH use, advised to drink no more than  1 drink per day.  Hx stroke/TIA   Other Active Problems  Aspiration pneumonia - Maxipime started 10/20/16 - leukocytosis  NPO - Jevity TF  At some point will need further workup for embolic source.  Elevated creatinine  Anemia  Hospital day # 4  I have personally examined this patient, reviewed notes, independently viewed imaging studies, participated in medical decision making and plan of care.ROS completed by me personally and pertinent positives fully documented  I have made any additions or clarifications directly to the above note.  Recommend dual antiplatelet therapy for 3 months followed by Plavix alone. Continue parenteral nutrition and medications as patient has severe dysarthria and dysphagia from his stroke. Patient therapy eval and patient does poorly may nightly need early PEG given severe dysarthria and dysphagia. Continue antibiotics for aspiration pneumonia.  Transfer to inpatient  rehabilitation for the next few days if stable. D/w Dr Thereasa Solo.  Antony Contras, MD Medical Director [place] Pager: [phone] 10/23/2016 12:32 PM   To contact Stroke Continuity provider, please refer to [email or web address] After hours, contact General Neurology

## 2016-10-23 NOTE — Progress Notes (Signed)
[facility] TEAM 1 - Stepdown/ICU TEAM  CLAYVON PARLETT  GMW:[id number] DOB: December 18, 1950 DOA: 10/19/2016 PCP: Patient, No Pcp Per    Brief Narrative:  66 y.o. male with history of HTN who presented to [facility] on 10/15/2016 after family found him on the floor confused. MRI showed multiple acute infarcts involving both hemispheres of the cerebellum, the right occipital lobe. right midbrain, and left thalamus.  The fourth ventricle was patent/no hydrocephalus. TTE noted EF 55-60% w/ normal LV size. Carotid doppler showed right ICA 40-59% stenosis with left 60-79% stenosis. CXR noted infiltrates and patient was febrile therefore the pt was started on antibiotics. Creatinine at admission was 2.6, but this improved to 1.5.  CK of 7000 was noted at the time of admission. Patient's mentation slowly improved.  Family requested transfer to [facility], and this was accomplished on 7/5.  Subjective: The patient appears to be stabilizing/improving with each passing day.  He is much more animated and interactive today.  He has regained more speech over the last 24 hours.  He does appear however to be confused.  He asked me for a beer when I was in the room.  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 noted on CTa - Neuro suggests "dual antiplatelet therapy for 3 months followed by Plavix alone" - will need extensive PT/OT and SLP - awaiting determination on CIR placement   Extensive left lung Aspiration pneumonia with possible mucous plugging Remains on empiric antibiotic therapy - nebs as needed - saturations greater than 90% without supplemental oxygen - follow-up chest x-ray has confirmed improvement - will plan to stop abx after a 7 day course - is afebrile presently, though WBC remain elevated  Dysphagia SLP following - Cortrak in place for tube  feeds and medications - suspect patient will need long-term feeding tube given the extent of his deficits - agree w/ Neuro that early PEG indicated unless surprising results noted from f/u SLP eval - awaiting f/u SLP eval - if needs PEG timing will depend on wether or not he is going to CIR - if he is to be placed in a SNF instead, the PEG will have to be placed prior to his d/c - if he is going to CIR this can be done while on CIR   Hypernatremia Sodium slowly creeping up - free water via feeding tube increased yesterday - add low-dose IV free water today  HLD cont treatment via feeding tube  HTN BP meds being slowly titrated - adjustments made 7/8 - will make no changes today and follow   Rhabdomyolysis CK normalized  Acute renal failure Creatinine peaked at 2.6 - continues to approach normal   Recent Labs Lab 10/19/16 2157 10/21/16 0253 10/22/16 0147 10/23/16 0330  CREATININE 1.80* 1.53* 1.40* 1.26*    History of alcoholism Patient essentially beyond the window for even late alcohol withdrawal - mildy confused but no agitation noted today   Mild transaminits  Improving - likely simple shock liver in setting fo EtOH abuse  - viral hep panel pending   Tobacco abuse  Severe malnutrition in context of acute illness  DVT prophylaxis: SCDs Code Status: FULL CODE Family Communication: spoke w/ sister at bedside  Disposition Plan: stable for neuro tele bed - awaiting SLP f/u - awaiting determination on CIR transfer  Consultants:  Neurology  Procedures: none  Antimicrobials:  Cefepime 7/4 > Vancomycin 7/5 > 7/6  Objective: Blood pressure (!) 165/87, pulse (!) 112, temperature 98.8 F (37.1 C), temperature source Oral, resp. rate (!) 30, height 5\' 11"  (1.803 m), weight 75.4 kg (166 lb 3.6 oz), SpO2 97 %.  Intake/Output Summary (Last 24 hours) at 10/23/16 1131 Last data filed at 10/23/16 0900  Gross per 24 hour  Intake             2205 ml  Output             1325 ml   Net              880 ml   Filed Weights   10/21/16 0341 10/22/16 0314 10/23/16 0344  Weight: 74.9 kg (165 lb 2 oz) 74 kg (163 lb 2.3 oz) 75.4 kg (166 lb 3.6 oz)    Examination: General: No acute respiratory distress Lungs: mild L basilar crackles with good air movement throughout other fields with no wheeze Cardiovascular: RRR w/o M gallop or rub Abdomen: Nondistended, soft, bowel sounds positive, no rebound, no ascites, no appreciable mass Extremities: No edema bilateral lower extremities   CBC:  Recent Labs Lab 10/19/16 2157 10/21/16 0253 10/22/16 0147 10/23/16 0330  WBC 18.5* 17.2* 21.4* 20.9*  NEUTROABS 14.9*  --   --   --   HGB 10.6* 10.0* 9.7* 9.5*  HCT 32.2* 30.0* 30.0* 30.1*  MCV 88.0 86.5 87.0 89.1  PLT 239 278 284 010   Basic Metabolic Panel:  Recent Labs Lab 10/19/16 2157 10/21/16 0253 10/22/16 0147 10/23/16 0330  NA 138 148* 149* 151*  K 3.8 3.5 3.5 3.8  CL 109 119* 122* 119*  CO2 21* 22 21* 25  GLUCOSE 96 141* 161* 132*  BUN 17 20 17 19   CREATININE 1.80* 1.53* 1.40* 1.26*  CALCIUM 9.2 9.1 9.3 9.3   GFR: Estimated Creatinine Clearance: 61.4 mL/min (A) (by C-G formula based on SCr of 1.26 mg/dL (H)).  Liver Function Tests:  Recent Labs Lab 10/19/16 2157 10/21/16 0253 10/22/16 0147 10/23/16 0330  AST 57* 106* 78* 46*  ALT 47 67* 75* 59  ALKPHOS 77 144* 176* 151*  BILITOT 1.8* 0.8 0.8 0.3  PROT 6.3* 6.7 6.1* 6.5  ALBUMIN 2.1* 1.9* 1.8* 1.9*    Cardiac Enzymes:  Recent Labs Lab 10/19/16 2157  CKTOTAL 199    Recent Results (from the past 240 hour(s))  MRSA PCR Screening     Status: None   Collection Time: 10/20/16  4:18 AM  Result Value Ref Range Status   MRSA by PCR NEGATIVE NEGATIVE Final    Comment:        The GeneXpert MRSA Assay (FDA approved for NASAL specimens only), is one component of a comprehensive MRSA colonization surveillance program. It is not intended to diagnose MRSA infection nor to guide or monitor  treatment for MRSA infections.      Scheduled Meds: . aspirin  325 mg Per Tube Daily  . chlorhexidine  15 mL Mouth Rinse BID  . clopidogrel  75 mg Per Tube Daily  . free water  300 mL Per Tube Q6H  . mouth rinse  15 mL Mouth Rinse q12n4p  . metoprolol tartrate  25 mg Per Tube BID  . nicotine  14 mg Transdermal Daily  . pravastatin  20 mg Per Tube q1800  . thiamine injection  100 mg Intravenous Daily     LOS: 4 days   Cherene Altes, MD [facility] Office  [phone] Pager - Text Page per Shea Evans as per below:  On-Call/Text Page:      [email or web address]      password [email or web address]  If 7PM-7AM, please contact night-coverage [email or web address] Password [email or web address] 10/23/2016, 11:31 AM

## 2016-10-23 NOTE — Progress Notes (Signed)
Inpatient Rehabilitation  Met with patient and sister to discuss team's recommendation for IP Rehab.  Patient's sister, [name] was very Patent attorney.  Shared booklets and answered questions.  [name] reports that patient has [employer] and Medicaid for insurance carriers.  I requested that she bring in his card so that we can initiate insurance authorization.  Plan to follow up with [name] tomorrow around 11am, per her request.  Also plan to follow for timing of medical readiness, insurance authorization, and bed availability.  Please call with questions.   Carmelia Roller., CCC/SLP Admission Coordinator  [place]  Cell [phone]

## 2016-10-23 NOTE — Care Management Note (Addendum)
Case Management Note  Patient Details  Name: Scott Rivera MRN: [id number] Date of Birth: Jun 25, 1950  Subjective/Objective:   From home alone,  Transferred from [facility], pta ambulated with a cane, sister took him to [facility] apts, presents with multiple acute strokes, asp pna, dysphagia, hypernatremia, hld, htn, rhabdo, and acute renal failure.  He has no insurance, no PCP.   Per pt eval rec CIR.  CIR MD is rec CIR also.  Melissa with CIR is following patient, she will speak with patient today, regarding CIR.  He has a cortrak,speech also following patient. CSW following also for back up.   7/9 [place], BSN - NCM spoke with sister, Clarise Cruz, she states patient has insurance with [facility] and Medicaid, she will bring the insurance cards tomorrow between 70 and 12.                 Action/Plan: NCM will follow for dc needs.   Expected Discharge Date:                  Expected Discharge Plan:  IP Rehab Facility  In-House Referral:  Clinical Social Work  Discharge planning Services  CM Consult  Post Acute Care Choice:  IP Rehab Choice offered to:     DME Arranged:    DME Agency:     HH Arranged:    [place] Agency:     Status of Service:  In process, will continue to follow  If discussed at Long Length of Stay Meetings, dates discussed:    Additional Comments:  Zenon Mayo, RN 10/23/2016, 12:05 PM

## 2016-10-23 NOTE — Progress Notes (Signed)
Attempted to call report, nurse busy will call me back

## 2016-10-23 NOTE — Progress Notes (Signed)
Report given to christy RN, pt to transfer to 5M07 via bed with belongings. Notified sister of new room number.
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
## 2016-10-24 ENCOUNTER — Inpatient Hospital Stay (HOSPITAL_COMMUNITY): Payer: Medicare HMO

## 2016-10-24 DIAGNOSIS — N171 Acute kidney failure with acute cortical necrosis: Secondary | ICD-10-CM

## 2016-10-24 LAB — GLUCOSE, CAPILLARY
Glucose-Capillary: 119 mg/dL — ABNORMAL HIGH (ref 65–99)
Glucose-Capillary: 109 mg/dL — ABNORMAL HIGH (ref 65–99)
Glucose-Capillary: 158 mg/dL — ABNORMAL HIGH (ref 65–99)
Glucose-Capillary: 143 mg/dL — ABNORMAL HIGH (ref 65–99)
Glucose-Capillary: 138 mg/dL — ABNORMAL HIGH (ref 65–99)

## 2016-10-24 LAB — HEPATITIS PANEL, ACUTE
HCV Ab: <0.1 s/co ratio (ref 0.0–0.9)
Hep A IgM: NEGATIVE
Hep B C IgM: NEGATIVE
Hepatitis B Surface Ag: NEGATIVE
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
## 2016-10-24 NOTE — Progress Notes (Signed)
Scott Rivera TEAM 1 - Stepdown/ICU TEAM  MARLON SULEIMAN  ION:[id number] DOB: 1951-01-09 DOA: 10/19/2016 PCP: Patient, No Pcp Per    Brief Narrative:  66 y.o. male with history of HTN who presented to [facility] on 10/15/2016 after family found him on the floor confused. MRI showed multiple acute infarcts involving both hemispheres of the cerebellum, the right occipital lobe. right midbrain, and left thalamus.  The fourth ventricle was patent/no hydrocephalus. TTE noted EF 55-60% w/ normal LV size. Carotid doppler showed right ICA 40-59% stenosis with left 60-79% stenosis. CXR noted infiltrates and patient was febrile therefore the pt was started on antibiotics. Creatinine at admission was 2.6, but this improved to 1.5.  CK of 7000 was noted at the time of admission. Patient's mentation slowly improved.  Family requested transfer to [facility], and this was accomplished on 7/5.  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 MBS today, still NPO   Extensive left lung Aspiration pneumonia with possible mucous plugging, persistent leukocytosis Continue cefepime - nebs as needed - saturations greater than 90% without supplemental oxygen - follow-up chest x-ray has confirmed improvement - will plan to stop abx after a 7 day course - is afebrile presently,   Dysphagia SLP following - Cortrak in place for tube feeds and medications - suspect patient will need long-term feeding tube given the extent of his deficits - agree w/ Neuro that early PEG indicated unless surprising results noted from f/u SLP eval - awaiting f/u SLP eval -awaiting  MBS      Hypernatremia Sodium slowly creeping up - free water via feeding tube increased yesterday - increased rate of half normal saline  HLD cont treatment via feeding tube  HTN BP meds being slowly titrated - adjustments made 7/8 - will make no changes today and follow   Rhabdomyolysis CK normalized  Acute renal failure Creatinine peaked at 2.6 - continues to approach normal   Recent Labs Lab 10/19/16 2157 10/21/16 0253 10/22/16 0147 10/23/16 0330  CREATININE 1.80* 1.53* 1.40* 1.26*    History of alcoholism Patient essentially beyond the window for even late alcohol withdrawal - mildy confused but no agitation noted today   Mild transaminits  Improving - likely simple shock liver in setting fo EtOH abuse  - viral hep panel pending   Tobacco abuse  Severe malnutrition in context of acute illness  DVT prophylaxis: SCDs Code Status: FULL CODE Family Communication: spoke w/ sister at bedside  Disposition Plan: Pending decision about PEG, CIR ?  Consultants:  Neurology  Procedures: none  Antimicrobials:  Cefepime 7/4 > Vancomycin 7/5 > 7/6  Objective: Blood pressure (!) 161/87, pulse (!) 108, temperature 98.2 F (36.8 C), temperature source Axillary, resp. rate 20, height 5\' 11"  (1.803 m), weight 77.3 kg (170 lb 6.4 oz), SpO2 98 %.  Intake/Output Summary (Last 24 hours) at 10/24/16 1029 Last data filed at 10/24/16 0635  Gross per 24 hour  Intake             3700 ml  Output             1600 ml  Net  2100 ml   Filed Weights   10/22/16 0314 10/23/16 0344 10/24/16 0456  Weight: 74 kg (163 lb 2.3 oz) 75.4 kg (166 lb 3.6 oz) 77.3 kg (170 lb 6.4 oz)    Examination: General: No acute respiratory distress Lungs: mild L basilar crackles with good air movement throughout other fields with no wheeze Cardiovascular: RRR w/o M gallop or rub Abdomen: Nondistended, soft, bowel sounds positive, no rebound, no ascites, no appreciable mass Extremities:  No edema bilateral lower extremities   CBC:  Recent Labs Lab 10/19/16 2157 10/21/16 0253 10/22/16 0147 10/23/16 0330  WBC 18.5* 17.2* 21.4* 20.9*  NEUTROABS 14.9*  --   --   --   HGB 10.6* 10.0* 9.7* 9.5*  HCT 32.2* 30.0* 30.0* 30.1*  MCV 88.0 86.5 87.0 89.1  PLT 239 278 284 449   Basic Metabolic Panel:  Recent Labs Lab 10/19/16 2157 10/21/16 0253 10/22/16 0147 10/23/16 0330  NA 138 148* 149* 151*  K 3.8 3.5 3.5 3.8  CL 109 119* 122* 119*  CO2 21* 22 21* 25  GLUCOSE 96 141* 161* 132*  BUN 17 20 17 19   CREATININE 1.80* 1.53* 1.40* 1.26*  CALCIUM 9.2 9.1 9.3 9.3   GFR: Estimated Creatinine Clearance: 61.4 mL/min (A) (by C-G formula based on SCr of 1.26 mg/dL (H)).  Liver Function Tests:  Recent Labs Lab 10/19/16 2157 10/21/16 0253 10/22/16 0147 10/23/16 0330  AST 57* 106* 78* 46*  ALT 47 67* 75* 59  ALKPHOS 77 144* 176* 151*  BILITOT 1.8* 0.8 0.8 0.3  PROT 6.3* 6.7 6.1* 6.5  ALBUMIN 2.1* 1.9* 1.8* 1.9*    Cardiac Enzymes:  Recent Labs Lab 10/19/16 2157  CKTOTAL 199    Recent Results (from the past 240 hour(s))  MRSA PCR Screening     Status: None   Collection Time: 10/20/16  4:18 AM  Result Value Ref Range Status   MRSA by PCR NEGATIVE NEGATIVE Final    Comment:        The GeneXpert MRSA Assay (FDA approved for NASAL specimens only), is one component of a comprehensive MRSA colonization surveillance program. It is not intended to diagnose MRSA infection nor to guide or monitor treatment for MRSA infections.      Scheduled Meds: . aspirin  325 mg Per Tube Daily  . bethanechol  10 mg Per Tube TID  . chlorhexidine  15 mL Mouth Rinse BID  . clopidogrel  75 mg Per Tube Daily  . free water  300 mL Per Tube Q4H  . mouth rinse  15 mL Mouth Rinse q12n4p  . metoprolol tartrate  25 mg Per Tube BID  . nicotine  14 mg Transdermal Daily  . pravastatin  20 mg Per Tube q1800  . thiamine injection  100 mg Intravenous Daily     LOS: 5 days        On-Call/Text Page:      [email or web address]      password [email or web address]  If 7PM-7AM, please contact night-coverage [email or web address] Password [email or web address] 10/24/2016, 10:29 AM
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
## 2016-10-24 NOTE — Progress Notes (Signed)
STROKE TEAM PROGRESS NOTE   HISTORY OF PRESENT ILLNESS (per record) Scott Rivera is an 66 y.o. male who presented to [facility] on July 1 with complaints of confusion per the family. Last confirmed well was two days prior by the patient's brother. Per H&P note, "MRI showed multiple acute infarcts involving the both hemisphere of the cerebellum and right occipital lobe right midbrain and left thalamus the fourth ventricle was patent no hydrocephalus". Imaging is not present for review at this time. No vascular imaging was performed at [facility]. Patient was transferred to [facility] by request of the family for a second opinion, Dr. Mike Rivera accepted.  Scott Rivera is severely dysarthric and understanding him was very difficult. He does not endorse dizziness, double or blurry vision at this time. When asked what happened, he stated that he was sitting on the side of his bed when he "fell out" three days ago. Further questioning regarding the event elicited "I fell out" repeatedly. Unknown baseline cognitive status at this time. Family was not at bedside, and there are no contacts on file.  Date last known well: Date: 10/13/2016 Time last known well: Unable to determine tPA Given: No: Out of window  SUBJECTIVE (INTERVAL HISTORY) No family members present.  He seems sleepier today . He has been seen by inpatient rehabilitation team and awaiting decision    OBJECTIVE Temp:  [97.9 F (36.6 C)-99.3 F (37.4 C)] 98.2 F (36.8 C) (07/10 0940) Pulse Rate:  [94-108] 108 (07/10 0940) Cardiac Rhythm: Normal sinus rhythm (07/10 0800) Resp:  [20-31] 20 (07/10 0940) BP: (154-178)/(66-118) 161/87 (07/10 0940) SpO2:  [93 %-98 %] 98 % (07/10 0940) Weight:  [170 lb 6.4 oz (77.3 kg)] 170 lb 6.4 oz (77.3 kg) (07/10 0456)  CBC:   Recent Labs Lab 10/19/16 2157  10/22/16 0147 10/23/16 0330  WBC 18.5*  < > 21.4* 20.9*  NEUTROABS 14.9*  --   --   --   HGB 10.6*  < > 9.7* 9.5*  HCT 32.2*  < >  30.0* 30.1*  MCV 88.0  < > 87.0 89.1  PLT 239  < > 284 324  < > = values in this interval not displayed.  Basic Metabolic Panel:   Recent Labs Lab 10/22/16 0147 10/23/16 0330  NA 149* 151*  K 3.5 3.8  CL 122* 119*  CO2 21* 25  GLUCOSE 161* 132*  BUN 17 19  CREATININE 1.40* 1.26*  CALCIUM 9.3 9.3    Lipid Panel:     Component Value Date/Time   CHOL 92 10/21/2016 0253   TRIG 103 10/21/2016 0253   HDL 14 (L) 10/21/2016 0253   CHOLHDL 6.6 10/21/2016 0253   VLDL 21 10/21/2016 0253   LDLCALC 57 10/21/2016 0253   HgbA1c:  Lab Results  Component Value Date   HGBA1C 5.4 10/21/2016   Urine Drug Screen:     Component Value Date/Time   LABOPIA NONE DETECTED 10/19/2016 0050   COCAINSCRNUR NONE DETECTED 10/19/2016 0050   LABBENZ NONE DETECTED 10/19/2016 0050   AMPHETMU NONE DETECTED 10/19/2016 0050   THCU NONE DETECTED 10/19/2016 0050   LABBARB NONE DETECTED 10/19/2016 0050    Alcohol Level No results found for: ETH  IMAGING  Ct Angio Head W Or Wo Contrast Ct Angio Neck W Or Wo Contrast 10/20/2016 1. No acute arterial finding.  2. Advanced left and mild right V1 segment atheromatous stenosis. No visible ulcerated plaque.  3. Motion artifact from swallowing significantly degrades evaluation  of the cervical carotids, especially on the left. No noted flow limiting stenosis.  4. Diminished number of left MCA branches in the setting of remote infarct.  5. No visible progression or hemorrhagic conversion of the acute posterior circulation infarcts.  6. Remote left MCA and ACA territory infarcts.     Dg Chest Port 1 View 10/19/2016 Increasing volume loss in the left hemithorax with progressive left lung opacities. Suspect aspiration/mucous plugging versus increasing pneumonia.    PHYSICAL EXAM Frail middle aged african [place] male in mild resp distress. . Afebrile. Head is nontraumatic. Neck is supple without bruit.    Cardiac exam no murmur or gallop. Lungs are clear to  auscultation. Distal pulses are well felt.  Neurological Exam :  Awake alert oriented 2. Severe dysarthria and  at times difficult to understand. Follows simple commands well. Extraocular movements are full range without nystagmus. Blinks to threat more on the right than the left. Pupils irregular but reactive. Fundi were not visualized. Left lower facial weakness. Tongue midline. Weak cough and gag. Mild left hemiparesis 4/5. Left upper extremity drift. Left grip and intrinsic hand muscles are weak. Lower extremity strength testing is limited by knee pain and swelling bilaterally but is symmetric with  Diffuse weakness  2-3/5 bilaterally. Deep tendon reflexes are symmetric. Plantars downgoing. Sensation appears intact. Mild left finger-to-nose dysmetria. Gait was not tested.    ASSESSMENT/PLAN Scott Rivera is a 66 y.o. male with history of previous strokes, hypertension and ongoing tobacco use presenting with severe dysarthria. He did not receive IV t-PA due to late presentation.  Stroke:  Multiple acute infarcts in both posterior cerebral hemispheres - likely symptomatic from high-grade proximal left vertebral artery stenosis.  Resultant  dysarthria and left hemiparesis   CT head evolving bilateral cerebellar, right midbrain and left thalamic infarcts  MRI head - OSH - multiple acute infarcts in both hemispheres  MRA head -not performed  CTA H&N - Diminished number of left MCA branches - remote left MCA and ACA territory infarcts.Advanced left and mild right V1 segment atheromatous stenosis Carotid Doppler - CTA neck Noncalcified plaque causes severe narrowing of the proximal left vertebral artery. No evidence of dissection. No ulceration. Right vertebral artery is mildly narrowed at the V1segment due to atheromatous plaque.  2D Echo -Left ventricle: The cavity size was normal. Wall thickness was   increased in a pattern of mild LVH. Systolic function was normal.   The estimated  ejection fraction was in the range of 60% to 65%.   Wall motion was normal; there were no regional wall motion    abnormalities.  LDL - 24  HgbA1c - 5.4  VTE prophylaxis - SCDs Diet NPO time specified  No antithrombotic prior to admission, now on aspirin 325 mg daily  Patient counseled to be compliant with his antithrombotic medications  Ongoing aggressive stroke risk factor management  Therapy recommendations: CIR recommended - rehabilitation M.D. consult has been ordered.  Disposition:  Pending  Hypertension  Stable  Permissive hypertension (OK if < 220/120) but gradually normalize in 5-7 days  Long-term BP goal normotensive  Hyperlipidemia  Home meds: Mevacor 20 mg daily not resumed in hospital  LDL 24, goal < 70  Pt NPO - resume statin when taking POs  Continue statin at discharge    Other Stroke Risk Factors  Advanced age  Cigarette smoker - advised to stop smoking  ETOH use, advised to drink no more than 1 drink per day.  Hx stroke/TIA  Other Active Problems  Aspiration pneumonia - Maxipime started 10/20/16 - leukocytosis  NPO - Jevity TF  At some point will need further workup for embolic source.  Elevated creatinine  Anemia  Hospital day # 5     Continue dual antiplatelet therapy for 3 months followed by Plavix alone. Continue parenteral nutrition and medications as patient has severe dysarthria and dysphagia from his stroke. Patient therapy eval and patient does poorly may nightly need early PEG given severe dysarthria and dysphagia. Continue antibiotics for aspiration pneumonia. ST plans modified barium today Transfer to inpatient rehabilitation for the next few days if stable.   Antony Contras, MD Medical Director [place] Pager: [phone] 10/24/2016 12:39 PM   To contact Stroke Continuity provider, please refer to [email or web address] After hours, contact General Neurology

## 2016-10-24 NOTE — Progress Notes (Signed)
Pt. Working with PT/OT and noted by them to be much different than he was yesterday on 4E. Pt. Unchanged from report received this AM. Pt.'s sister states that yesterday around 3pm pt. had a sudden change and was not talking as much or following commands. Pt.'s NIH was 22 last pm and remains 22 at this time. Pt. Only intermittently following commands and minimally verbal. Also much weaker per the patient's sister and therapy. Elenor Legato, [place] paged and notified of these concerns, no further orders received.

## 2016-10-24 NOTE — Progress Notes (Signed)
Modified Barium Swallow Progress Note  Patient Details  Name: Scott Rivera MRN: [id number] Date of Birth: [birth year]  Today's Date: 10/24/2016  Modified Barium Swallow completed.  Full report located under Chart Review in the Imaging Section.  Brief recommendations include the following:  Clinical Impression   Moderate-severe risk for aspiration d/t moderate pharyngeal phase dysphagia secondary to delay in the initiation of the swallow to pyriform sinuses and aspiration to level of cords requiring suctioning post-swallow with honey-thickened liquids, plus pt inconsistent with LOA/recent status change; see recommendations below   Swallow Evaluation Recommendations       SLP Diet Recommendations: NPO;Other (Comment) (pudding-thick liquids; puree consistency with ST only)   Liquid Administration via: Spoon   Medication Administration: Other (Comment) (crushed with puree depending on pt alertness level)   Supervision: Full assist for feeding           Oral Care Recommendations: Oral care QID        Elvina Sidle, M.S., CCC-SLP 10/24/2016,3:50 PM

## 2016-10-24 NOTE — Progress Notes (Addendum)
Inpatient Rehabilitation  I have submitted updated clinicals from today's therapy sessions to [facility].  Plan to update the team when there ehas been a decision.  Will continue to follow for timing of medical readiness, plans for nutrition, insurance authorization, and bed availability.  Please call with questions.   Carmelia Roller., CCC/SLP Admission Coordinator  [place]  Cell [phone]

## 2016-10-24 NOTE — Progress Notes (Signed)
Inpatient Rehabilitation  I have received patient's insurance information and have initiated authorization for IP Rehab with [facility] Medicare.  Updated sister and RNCM.  Plan to follow up with the team when I have a decision.  Please call with questions.   Carmelia Roller., CCC/SLP Admission Coordinator  [place]  Cell [phone]

## 2016-10-25 DIAGNOSIS — N183 Chronic kidney disease, stage 3 (moderate): Secondary | ICD-10-CM

## 2016-10-25 LAB — CBC
HCT: 27 % — ABNORMAL LOW (ref 39.0–52.0)
Hemoglobin: 8.8 g/dL — ABNORMAL LOW (ref 13.0–17.0)
MCH: 28 pg (ref 26.0–34.0)
MCHC: 32.6 g/dL (ref 30.0–36.0)
MCV: 86 fL (ref 78.0–100.0)
Platelets: 392 10*3/uL (ref 150–400)
RBC: 3.14 MIL/uL — ABNORMAL LOW (ref 4.22–5.81)
RDW: 15.5 % (ref 11.5–15.5)
WBC: 18.8 10*3/uL — ABNORMAL HIGH (ref 4.0–10.5)

## 2016-10-25 LAB — GLUCOSE, CAPILLARY
Glucose-Capillary: 123 mg/dL — ABNORMAL HIGH (ref 65–99)
Glucose-Capillary: 127 mg/dL — ABNORMAL HIGH (ref 65–99)
Glucose-Capillary: 129 mg/dL — ABNORMAL HIGH (ref 65–99)
Glucose-Capillary: 137 mg/dL — ABNORMAL HIGH (ref 65–99)
Glucose-Capillary: 147 mg/dL — ABNORMAL HIGH (ref 65–99)
Glucose-Capillary: 167 mg/dL — ABNORMAL HIGH (ref 65–99)

## 2016-10-25 LAB — COMPREHENSIVE METABOLIC PANEL
ALT: 55 U/L (ref 17–63)
AST: 50 U/L — ABNORMAL HIGH (ref 15–41)
Albumin: 1.8 g/dL — ABNORMAL LOW (ref 3.5–5.0)
Alkaline Phosphatase: 133 U/L — ABNORMAL HIGH (ref 38–126)
Anion gap: 7 (ref 5–15)
BUN: 18 mg/dL (ref 6–20)
CO2: 25 mmol/L (ref 22–32)
Calcium: 8.8 mg/dL — ABNORMAL LOW (ref 8.9–10.3)
Chloride: 106 mmol/L (ref 101–111)
Creatinine, Ser: 1.09 mg/dL (ref 0.61–1.24)
GFR calc Af Amer: >60 mL/min (ref >60)
GFR calc non Af Amer: >60 mL/min (ref >60)
Glucose, Bld: 154 mg/dL — ABNORMAL HIGH (ref 65–99)
Potassium: 4.1 mmol/L (ref 3.5–5.1)
Sodium: 138 mmol/L (ref 135–145)
Total Bilirubin: 0.5 mg/dL (ref 0.3–1.2)
Total Protein: 6.3 g/dL — ABNORMAL LOW (ref 6.5–8.1)

## 2016-10-25 MED ORDER — DEXTROSE 5 % IV SOLN
1.0000 g | Freq: Two times a day (BID) | INTRAVENOUS | Status: AC
  Administered 2016-10-25 (×2): 1 g via INTRAVENOUS
  Filled 2016-10-25 (×2): qty 1

## 2016-10-25 MED ORDER — PRAVASTATIN SODIUM 20 MG PO TABS: 20.0000 mg | ORAL_TABLET | Freq: Every day | ORAL | 1 refills | Status: DC

## 2016-10-25 MED ORDER — NICOTINE 14 MG/24HR TD PT24: 14.0000 mg | MEDICATED_PATCH | Freq: Every day | TRANSDERMAL | 0 refills | Status: DC

## 2016-10-25 MED ORDER — METOPROLOL TARTRATE 25 MG/10 ML ORAL SUSPENSION: 25.0000 mg | Freq: Two times a day (BID) | ORAL | 0 refills | Status: DC

## 2016-10-25 MED ORDER — BETHANECHOL CHLORIDE 10 MG PO TABS: 10.0000 mg | ORAL_TABLET | Freq: Three times a day (TID) | ORAL | 0 refills | Status: DC

## 2016-10-25 MED ORDER — IOPAMIDOL (ISOVUE-300) INJECTION 61%
INTRAVENOUS | Status: AC
  Administered 2016-10-26: 30 mL
  Filled 2016-10-25: qty 30

## 2016-10-25 MED ORDER — CLOPIDOGREL BISULFATE 75 MG PO TABS: 75.0000 mg | ORAL_TABLET | Freq: Every day | ORAL | 1 refills | Status: DC

## 2016-10-25 MED ORDER — TRAMADOL HCL 50 MG PO TABS: 50.0000 mg | ORAL_TABLET | Freq: Two times a day (BID) | ORAL | 1 refills | Status: DC | PRN

## 2016-10-25 MED ORDER — JEVITY 1.2 CAL PO LIQD: 1000.0000 mL | ORAL | 0 refills | Status: DC

## 2016-10-25 MED ORDER — FREE WATER: 300.0000 mL | 0 refills | Status: DC

## 2016-10-25 MED ORDER — ASPIRIN 325 MG PO TABS: 325.0000 mg | ORAL_TABLET | Freq: Every day | ORAL | 2 refills | Status: DC

## 2016-10-25 NOTE — Progress Notes (Signed)
STROKE TEAM PROGRESS NOTE   HISTORY OF PRESENT ILLNESS (per record) Scott Rivera is an 66 y.o. male who presented to [facility] on July 1 with complaints of confusion per the family. Last confirmed well was two days prior by the patient's brother. Per H&P note, "MRI showed multiple acute infarcts involving the both hemisphere of the cerebellum and right occipital lobe right midbrain and left thalamus the fourth ventricle was patent no hydrocephalus". Imaging is not present for review at this time. No vascular imaging was performed at [facility]. Patient was transferred to [facility] by request of the family for a second opinion, Dr. Mike Craze accepted.  [name] is severely dysarthric and understanding him was very difficult. He does not endorse dizziness, double or blurry vision at this time. When asked what happened, he stated that he was sitting on the side of his bed when he "fell out" three days ago. Further questioning regarding the event elicited "I fell out" repeatedly. Unknown baseline cognitive status at this time. Family was not at bedside, and there are no contacts on file.  Date last known well: Date: 10/13/2016 Time last known well: Unable to determine tPA Given: No: Out of window  SUBJECTIVE (INTERVAL HISTORY) No family members present.  He seems more alert and interactive today . He has been failed swallow eval y`day and will need PEG tube    OBJECTIVE Temp:  [98.1 F (36.7 C)-100.3 F (37.9 C)] 99.3 F (37.4 C) (07/11 0944) Pulse Rate:  [94-116] 116 (07/11 0944) Cardiac Rhythm: Sinus tachycardia (07/11 0700) Resp:  [18-20] 19 (07/11 0944) BP: (136-171)/(61-79) 148/79 (07/11 0944) SpO2:  [92 %-99 %] 97 % (07/11 0944)  CBC:   Recent Labs Lab 10/19/16 2157  10/23/16 0330 10/25/16 0505  WBC 18.5*  < > 20.9* 18.8*  NEUTROABS 14.9*  --   --   --   HGB 10.6*  < > 9.5* 8.8*  HCT 32.2*  < > 30.1* 27.0*  MCV 88.0  < > 89.1 86.0  PLT 239  < > 324 392  < > =  values in this interval not displayed.  Basic Metabolic Panel:   Recent Labs Lab 10/23/16 0330 10/25/16 0505  NA 151* 138  K 3.8 4.1  CL 119* 106  CO2 25 25  GLUCOSE 132* 154*  BUN 19 18  CREATININE 1.26* 1.09  CALCIUM 9.3 8.8*    Lipid Panel:     Component Value Date/Time   CHOL 92 10/21/2016 0253   TRIG 103 10/21/2016 0253   HDL 14 (L) 10/21/2016 0253   CHOLHDL 6.6 10/21/2016 0253   VLDL 21 10/21/2016 0253   LDLCALC 57 10/21/2016 0253   HgbA1c:  Lab Results  Component Value Date   HGBA1C 5.4 10/21/2016   Urine Drug Screen:     Component Value Date/Time   LABOPIA NONE DETECTED 10/19/2016 0050   COCAINSCRNUR NONE DETECTED 10/19/2016 0050   LABBENZ NONE DETECTED 10/19/2016 0050   AMPHETMU NONE DETECTED 10/19/2016 0050   THCU NONE DETECTED 10/19/2016 0050   LABBARB NONE DETECTED 10/19/2016 0050    Alcohol Level No results found for: ETH  IMAGING  Ct Angio Head W Or Wo Contrast Ct Angio Neck W Or Wo Contrast 10/20/2016 1. No acute arterial finding.  2. Advanced left and mild right V1 segment atheromatous stenosis. No visible ulcerated plaque.  3. Motion artifact from swallowing significantly degrades evaluation of the cervical carotids, especially on the left. No noted flow limiting stenosis.  4. Diminished number of left MCA branches in the setting of remote infarct.  5. No visible progression or hemorrhagic conversion of the acute posterior circulation infarcts.  6. Remote left MCA and ACA territory infarcts.     Dg Chest Port 1 View 10/19/2016 Increasing volume loss in the left hemithorax with progressive left lung opacities. Suspect aspiration/mucous plugging versus increasing pneumonia.    PHYSICAL EXAM Frail middle aged african [place] male in mild resp distress. . Afebrile. Head is nontraumatic. Neck is supple without bruit.    Cardiac exam no murmur or gallop. Lungs are clear to auscultation. Distal pulses are well felt.  Neurological Exam :   Awake alert oriented 2. Severe dysarthria and  at times difficult to understand. Follows simple commands well. Extraocular movements are full range without nystagmus. Blinks to threat more on the right than the left. Pupils irregular but reactive. Fundi were not visualized. Left lower facial weakness. Tongue midline. Weak cough and gag. Mild left hemiparesis 4/5. Left upper extremity drift. Left grip and intrinsic hand muscles are weak. Lower extremity strength testing is limited by knee pain and swelling bilaterally but is symmetric with  Diffuse weakness  2-3/5 bilaterally. Deep tendon reflexes are symmetric. Plantars downgoing. Sensation appears intact. Mild left finger-to-nose dysmetria. Gait was not tested.    ASSESSMENT/PLAN [name] is a 66 y.o. male with history of previous strokes, hypertension and ongoing tobacco use presenting with severe dysarthria. He did not receive IV t-PA due to late presentation.  Stroke:  Multiple acute infarcts in both posterior cerebral hemispheres - likely symptomatic from high-grade proximal left vertebral artery stenosis.  Resultant  dysarthria and left hemiparesis   CT head evolving bilateral cerebellar, right midbrain and left thalamic infarcts  MRI head - OSH - multiple acute infarcts in both hemispheres  MRA head -not performed  CTA H&N - Diminished number of left MCA branches - remote left MCA and ACA territory infarcts.Advanced left and mild right V1 segment atheromatous stenosis Carotid Doppler - CTA neck Noncalcified plaque causes severe narrowing of the proximal left vertebral artery. No evidence of dissection. No ulceration. Right vertebral artery is mildly narrowed at the V1segment due to atheromatous plaque.  2D Echo -Left ventricle: The cavity size was normal. Wall thickness was   increased in a pattern of mild LVH. Systolic function was normal.   The estimated ejection fraction was in the range of 60% to 65%.   Wall motion was  normal; there were no regional wall motion    abnormalities.  LDL - 24  HgbA1c - 5.4  VTE prophylaxis - SCDs Diet NPO time specified Diet - low sodium heart healthy  No antithrombotic prior to admission, now on aspirin 325 mg daily  Patient counseled to be compliant with his antithrombotic medications  Ongoing aggressive stroke risk factor management  Therapy recommendations: CIR recommended - rehabilitation M.D. consult has been ordered.  Disposition:  Pending  Hypertension  Stable  Permissive hypertension (OK if < 220/120) but gradually normalize in 5-7 days  Long-term BP goal normotensive  Hyperlipidemia  Home meds: Mevacor 20 mg daily not resumed in hospital  LDL 24, goal < 70  Pt NPO - resume statin when taking POs  Continue statin at discharge    Other Stroke Risk Factors  Advanced age  Cigarette smoker - advised to stop smoking  ETOH use, advised to drink no more than 1 drink per day.  Hx stroke/TIA   Other Active Problems  Aspiration pneumonia -  Maxipime started 10/20/16 - leukocytosis  NPO - Jevity TF  At some point will need further workup for embolic source.  Elevated creatinine  Anemia  Hospital day # 6     Continue dual antiplatelet therapy for 3 months followed by Plavix alone. Continue parenteral nutrition and medications as patient has severe dysarthria and dysphagia from his stroke. He may need early PEG given severe dysarthria and dysphagia. Continue antibiotics for aspiration pneumonia.  Transfer to inpatient rehabilitation for the next few days if stable.  D/w dr Allyson Sabal Antony Contras, MD Medical Director [facility] Pager: [phone] 10/25/2016 12:49 PM   To contact Stroke Continuity provider, please refer to [email or web address] After hours, contact General Neurology

## 2016-10-25 NOTE — Progress Notes (Signed)
TRH paged to confirm CT of abdomen only or is abdomen and pelvis needed. Patient is NPO. Order to give contrast via NG tube required. RN will continue to monitor and return call to [facility] in radiology at [phone].

## 2016-10-25 NOTE — Progress Notes (Signed)
CM informed per Scott Rivera with CIR that patient received a denial for admission to CIR. CM updated the MD.  Scott Rivera also informed CM that patients sister, Scott Rivera, was thinking about taking the patient home at discharge.  CM met with Scott Rivera and went over the reasons why he was not able to go to CIR and what the plan is when patient is medically ready for d/c.  Scott Rivera states she would like to be able to take him home and that is the ultimate goal but feels that some rehab prior to home would be beneficial. She is going to visit some of the SNF in [facility] and she also expressed interest in [facility]. CM asked if we could fax him out in the [facility] area and let her know of bed offers. She was in agreement. CM provided her a SNF list so she could start looking into the rehabs. Scott Rivera inquired about aide services after rehab. CM encouraged her to reach out to Mr [facility] CM and get him on the list for aide services.  Currently awaiting to see if patient is going to require a PEG prior to d/c. CM following.

## 2016-10-25 NOTE — Discharge Summary (Addendum)
Physician Discharge Summary  Scott Rivera MRN: [id number] DOB/AGE: Feb 09, 1951 66 y.o.  PCP: Patient, No Pcp Per   Admit date: 10/19/2016 Discharge date: 10/25/2016  Discharge Diagnoses:    Principal Problem:   Cerebellar infarct [facility]) Active Problems:   ARF (acute renal failure) (HCC)   Essential hypertension   Rhabdomyolysis   Cerebellar stroke ([place])   Pressure injury of skin   Dysphagia, post-stroke   Benign essential HTN   Tobacco abuse   Tachypnea   Hyperglycemia   Hypernatremia   Leukocytosis   Acute blood loss anemia   Chronic kidney disease  Addendum Patient denied by CIR Will need PEG tube placement by IR prior to discharge to SNF  Follow-up recommendations Follow-up with PCP in 3-5 days , including all  additional recommended appointments as below Follow-up CBC, CMP in 3-5 days Speech therapy recommendations-continue SLP evaluations, full feeding assistance, NG tube, may need PEG Continue dual antiplatelet therapy for 3 months followed by Plavix alone    Current Discharge Medication List    START taking these medications   Details  aspirin 325 MG tablet Place 1 tablet (325 mg total) into feeding tube daily. Qty: 30 tablet, Refills: 2    bethanechol (URECHOLINE) 10 MG tablet Place 1 tablet (10 mg total) into feeding tube 3 (three) times daily. Qty: 30 tablet, Refills: 0    clopidogrel (PLAVIX) 75 MG tablet Place 1 tablet (75 mg total) into feeding tube daily. Qty: 30 tablet, Refills: 1    metoprolol tartrate (LOPRESSOR) 25 mg/10 mL SUSP Place 10 mLs (25 mg total) into feeding tube 2 (two) times daily. Qty: 20 mL, Refills: 0    nicotine (NICODERM CQ - DOSED IN MG/24 HOURS) 14 mg/24hr patch Place 1 patch (14 mg total) onto the skin daily. Qty: 28 patch, Refills: 0    Nutritional Supplements (FEEDING SUPPLEMENT, JEVITY 1.2 CAL,) LIQD Place 1,000 mLs into feeding tube continuous. Qty: 237 mL, Refills: 0    pravastatin (PRAVACHOL) 20 MG tablet Place 1  tablet (20 mg total) into feeding tube daily at 6 PM. Qty: 30 tablet, Refills: 1    Water For Irrigation, Sterile (FREE WATER) SOLN Place 300 mLs into feeding tube every 4 (four) hours. Qty: 1000 mL, Refills: 0      CONTINUE these medications which have CHANGED   Details  traMADol (ULTRAM) 50 MG tablet Take 1 tablet (50 mg total) by mouth 2 (two) times daily as needed for severe pain. Qty: 30 tablet, Refills: 1      STOP taking these medications     amLODipine-benazepril (LOTREL) 10-40 MG capsule      lovastatin (MEVACOR) 20 MG tablet          Discharge Condition: Prognosis guarded       Disposition: CIR    Consults:  Neurology     Significant Diagnostic Studies:  Ct Angio Head W Or Wo Contrast  Result Date: 10/20/2016 CLINICAL DATA:  Posterior circulation infarcts EXAM: CT ANGIOGRAPHY HEAD AND NECK TECHNIQUE: Multidetector CT imaging of the head and neck was performed using the standard protocol during bolus administration of intravenous contrast. Multiplanar CT image reconstructions and MIPs were obtained to evaluate the vascular anatomy. Carotid stenosis measurements (when applicable) are obtained utilizing NASCET criteria, using the distal internal carotid diameter as the denominator. CONTRAST:  50 cc Isovue 370 intravenous COMPARISON:  Head CT and abbreviated brain MRI 10/17/2016 and 10/16/2016 respectively FINDINGS: CT HEAD FINDINGS Brain: From head CT 3 days prior there is  stable acute infarction in the left more than right cerebellum, right midbrain, left thalamus, and right occipital lobe. Extensive remote infarction in the perisylvian left MCA territory and distal left ACA territories. No hemorrhagic conversion. Upper fourth ventricular narrowing without hydrocephalus. Vascular: Atherosclerotic calcification. Skull: No acute or aggressive finding Sinuses: Mild patchy mucosal thickening. Orbits: Negative Review of the MIP images confirms the above findings CTA NECK  FINDINGS Aortic arch: Partly covered. Predominant noncalcified atheromatous wall thickening. No evidence of dissection. Right carotid system: Intermittently motion degraded. There is predominately calcified plaque on the distal common carotid, carotid bifurcation, and proximal ICA without suspected flow limiting stenosis or dissection. Left carotid system: Noncalcified plaque narrows the proximal common carotid artery by 40%. Motion from swallowing significant degrades evaluation of the common carotid bifurcation where there is a large calcified plaque. No distal ICA stenosis P Vertebral arteries: Proximal subclavian atherosclerosis without flow limiting stenosis. Noncalcified plaque causes severe narrowing of the proximal left vertebral artery. No evidence of dissection. No ulceration. Right vertebral artery is mildly narrowed at the V1 segment due to atheromatous plaque. Thyrocervical trunk and vertebral artery share a common origin on the right. Skeleton: Advanced cervical disc degeneration. Advanced left sternoclavicular joint osteoarthritis. Other neck: No incidental mass or inflammation noted. Solitary right thyroid lobe. Upper chest: Patchy lung opacity and pleural fluid on the left, known from chest x-ray yesterday Review of the MIP images confirms the above findings CTA HEAD FINDINGS Anterior circulation: Confluent atherosclerotic plaque on the carotid siphons without flow limiting stenosis. Comparatively less number of left MCA branches in the setting of prior infarct. No acute occlusion identified. Negative for beading or aneurysm. Posterior circulation: Codominant vertebral arteries. Both picas are patent. Calcified plaque on the right V4 segment without significant stenosis. Mild proximal basilar narrowing best seen in the sagittal projection. Mild atheromatous narrowing of right P1 segment. Venous sinuses: Patent Anatomic variants: None significant. Delayed phase: No abnormal intracranial enhancement.  Review of the MIP images confirms the above findings IMPRESSION: 1. No acute arterial finding. 2. Advanced left and mild right V1 segment atheromatous stenosis. No visible ulcerated plaque. 3. Motion artifact from swallowing significantly degrades evaluation of the cervical carotids, especially on the left. No noted flow limiting stenosis. 4. Diminished number of left MCA branches in the setting of remote infarct. 5. No visible progression or hemorrhagic conversion of the acute posterior circulation infarcts. 6. Remote left MCA and ACA territory infarcts. Electronically Signed   By: Monte Fantasia M.D.   On: 10/20/2016 14:09   Ct Angio Neck W Or Wo Contrast  Result Date: 10/20/2016 CLINICAL DATA:  Posterior circulation infarcts EXAM: CT ANGIOGRAPHY HEAD AND NECK TECHNIQUE: Multidetector CT imaging of the head and neck was performed using the standard protocol during bolus administration of intravenous contrast. Multiplanar CT image reconstructions and MIPs were obtained to evaluate the vascular anatomy. Carotid stenosis measurements (when applicable) are obtained utilizing NASCET criteria, using the distal internal carotid diameter as the denominator. CONTRAST:  50 cc Isovue 370 intravenous COMPARISON:  Head CT and abbreviated brain MRI 10/17/2016 and 10/16/2016 respectively FINDINGS: CT HEAD FINDINGS Brain: From head CT 3 days prior there is stable acute infarction in the left more than right cerebellum, right midbrain, left thalamus, and right occipital lobe. Extensive remote infarction in the perisylvian left MCA territory and distal left ACA territories. No hemorrhagic conversion. Upper fourth ventricular narrowing without hydrocephalus. Vascular: Atherosclerotic calcification. Skull: No acute or aggressive finding Sinuses: Mild patchy mucosal thickening. Orbits: Negative Review  of the MIP images confirms the above findings CTA NECK FINDINGS Aortic arch: Partly covered. Predominant noncalcified atheromatous  wall thickening. No evidence of dissection. Right carotid system: Intermittently motion degraded. There is predominately calcified plaque on the distal common carotid, carotid bifurcation, and proximal ICA without suspected flow limiting stenosis or dissection. Left carotid system: Noncalcified plaque narrows the proximal common carotid artery by 40%. Motion from swallowing significant degrades evaluation of the common carotid bifurcation where there is a large calcified plaque. No distal ICA stenosis P Vertebral arteries: Proximal subclavian atherosclerosis without flow limiting stenosis. Noncalcified plaque causes severe narrowing of the proximal left vertebral artery. No evidence of dissection. No ulceration. Right vertebral artery is mildly narrowed at the V1 segment due to atheromatous plaque. Thyrocervical trunk and vertebral artery share a common origin on the right. Skeleton: Advanced cervical disc degeneration. Advanced left sternoclavicular joint osteoarthritis. Other neck: No incidental mass or inflammation noted. Solitary right thyroid lobe. Upper chest: Patchy lung opacity and pleural fluid on the left, known from chest x-ray yesterday Review of the MIP images confirms the above findings CTA HEAD FINDINGS Anterior circulation: Confluent atherosclerotic plaque on the carotid siphons without flow limiting stenosis. Comparatively less number of left MCA branches in the setting of prior infarct. No acute occlusion identified. Negative for beading or aneurysm. Posterior circulation: Codominant vertebral arteries. Both picas are patent. Calcified plaque on the right V4 segment without significant stenosis. Mild proximal basilar narrowing best seen in the sagittal projection. Mild atheromatous narrowing of right P1 segment. Venous sinuses: Patent Anatomic variants: None significant. Delayed phase: No abnormal intracranial enhancement. Review of the MIP images confirms the above findings IMPRESSION: 1. No acute  arterial finding. 2. Advanced left and mild right V1 segment atheromatous stenosis. No visible ulcerated plaque. 3. Motion artifact from swallowing significantly degrades evaluation of the cervical carotids, especially on the left. No noted flow limiting stenosis. 4. Diminished number of left MCA branches in the setting of remote infarct. 5. No visible progression or hemorrhagic conversion of the acute posterior circulation infarcts. 6. Remote left MCA and ACA territory infarcts. Electronically Signed   By: Monte Fantasia M.D.   On: 10/20/2016 14:09   Dg Chest Port 1 View  Result Date: 10/22/2016 CLINICAL DATA:  Pneumonia. EXAM: PORTABLE CHEST 1 VIEW COMPARISON:  10/19/2016 FINDINGS: 0543 hours. A feeding tube passes into the stomach although the distal tip position is not included on the film. Cardiopericardial silhouette is at upper limits of normal for size. The retrocardiac left base collapse/ consolidation has decreased in the interval. Right lung remains clear. The visualized bony structures of the thorax are intact. Telemetry leads overlie the chest. IMPRESSION: Interval improvement in aeration at the left base. Otherwise stable exam. Electronically Signed   By: Misty Stanley M.D.   On: 10/22/2016 07:20   Dg Chest Port 1 View  Result Date: 10/19/2016 CLINICAL DATA:  Shortness of breath. EXAM: PORTABLE CHEST 1 VIEW COMPARISON:  Frontal and lateral views earlier this day. Radiographs yesterday and 10/15/2016 FINDINGS: Increasing volume loss in the left lung with progressive patchy opacities in the perihilar and lower lung zone. Leftward mediastinal shift likely secondary to volume loss. The heart is normal in size. No focal abnormality in the right lung. Difficult to exclude left pleural effusion. Sclerotic density in the left humeral head is unchanged. IMPRESSION: Increasing volume loss in the left hemithorax with progressive left lung opacities. Suspect aspiration/mucous plugging versus increasing  pneumonia. Electronically Signed   By: Threasa Beards  Ehinger M.D.   On: 10/19/2016 21:29   Dg Swallowing Func-speech Pathology  Result Date: 10/24/2016 Objective Swallowing Evaluation: Type of Study: MBS-Modified Barium Swallow Study Patient Details Name: Scott Rivera MRN: [id number] Date of Birth: 08-17-1950 Today's Date: 10/24/2016 Time: SLP Start Time (ACUTE ONLY): 1350-SLP Stop Time (ACUTE ONLY): 1404 SLP Time Calculation (min) (ACUTE ONLY): 14 min Past Medical History: Past Medical History: Diagnosis Date . Hypertension  . Stroke [facility])  Past Surgical History: Past Surgical History: Procedure Laterality Date . KNEE SURGERY   HPI: 66 y.o. male with history of hypertension was brought to [facility] on 10/15/2016 after patient was found to be confused. As per the family who provided most of the history patient was not able to be raised and family went to check on him at home and was found on the floor confused. Patient was taken to the ER. The last contact was 2 days ago by patient's brother prior to the episode. At [facility] MRI showed multiple acute infarcts involving the both hemisphere of the cerebellum and right occipital lobe right midbrain and left thalamus the fourth ventricle was patent no hydrocephalus. 2-D echo showing EF of 55-60% normal LV size. Carotid Doppler was done which showed right ICA 40-59% stenosis with left 60-79% stenosis. Chest x-ray was showing infiltrates and patient also was febrile for which patient was started on antibiotics. Patient's creatinine at admission was 2.6 which improved to 1.5 the time of discharge. Patient also had rhabdomyolysis with CK of 7000 at the time of admission. As per the note patient was initially agitated requiring Haldol at the other hospital. Patient's mentation slowly improved gradually and family requested transfer to [facility] after discussing with neurologist at [facility] Dr. Leonel Ramsay; BSE  completed 10/20/16 indicating need for objective testing; pt remained NPO. No Data Recorded Assessment / Plan / Recommendation CHL IP CLINICAL IMPRESSIONS 10/24/2016 Clinical Impression Pt with moderate pharyngeal phase dysphagia with aspiration noted after the swallow with honey-thickened liquids via 1/2 tsp amount requiring suctioning post-swallow; swallow triggered at pyriform sinuses with puree, honey-thickened liquids and pudding-thickened liquids via spoon d/t impaired sensation; vallecular/pyriform residuals noted with honey-thickened liquids despite independent repetitive swallows; minimal residuals in valleculae/pyriforms noted with pudding-thick consistencies/puree consistencies and cleared to an insignificant amount with repetitive swallow; recommend pt remain NPO with ST providing comfort feeding with pudding-thickened liquids/puree only with nutritional support provided via Cortrak until pt mentation and strength improves for diet upgrade d/t safety purposes/moderate-severe risk of aspiration d/t pt LOA, recent change in status, weak, congested cough and severity of dysphagia SLP Visit Diagnosis Dysphagia, pharyngeal phase (R13.13) Attention and concentration deficit following -- Frontal lobe and executive function deficit following -- Impact on safety and function Moderate aspiration risk;Severe aspiration risk   CHL IP TREATMENT RECOMMENDATION 10/24/2016 Treatment Recommendations Therapy as outlined in treatment plan below   Prognosis 10/24/2016 Prognosis for Safe Diet Advancement Fair Barriers to Reach Goals Cognitive deficits;Severity of deficits Barriers/Prognosis Comment -- CHL IP DIET RECOMMENDATION 10/24/2016 SLP Diet Recommendations NPO;Other (comfort feeding provided by ST only with pudding-thick liquids/puree) Liquid Administration via Spoon Medication Administration Other (via alternative means) Compensations -- Postural Changes --   CHL IP OTHER RECOMMENDATIONS 10/24/2016 Recommended Consults --  Oral Care Recommendations Oral care QID Other Recommendations --   CHL IP FOLLOW UP RECOMMENDATIONS 10/24/2016 Follow up Recommendations Other (comment)   CHL IP FREQUENCY AND DURATION 10/24/2016 Speech Therapy Frequency (ACUTE ONLY) min 2x/week Treatment Duration 1  week      CHL IP ORAL PHASE 10/24/2016 Oral Phase WFL Oral - Pudding Teaspoon -- Oral - Pudding Cup -- Oral - Honey Teaspoon -- Oral - Honey Cup -- Oral - Nectar Teaspoon -- Oral - Nectar Cup -- Oral - Nectar Straw -- Oral - Thin Teaspoon -- Oral - Thin Cup -- Oral - Thin Straw -- Oral - Puree -- Oral - Mech Soft -- Oral - Regular -- Oral - Multi-Consistency -- Oral - Pill -- Oral Phase - Comment --  CHL IP PHARYNGEAL PHASE 10/24/2016 Pharyngeal Phase Impaired Pharyngeal- Pudding Teaspoon Delayed swallow initiation-pyriform sinuses Pharyngeal Material does not enter airway Pharyngeal- Pudding Cup -- Pharyngeal -- Pharyngeal- Honey Teaspoon Delayed swallow initiation-pyriform sinuses;Reduced airway/laryngeal closure;Penetration/Apiration after swallow;Moderate aspiration;Pharyngeal residue - valleculae;Pharyngeal residue - pyriform;Compensatory strategies attempted (with notebox) Pharyngeal Material enters airway, passes BELOW cords and not ejected out despite cough attempt by patient Pharyngeal- Honey Cup -- Pharyngeal -- Pharyngeal- Nectar Teaspoon -- Pharyngeal -- Pharyngeal- Nectar Cup -- Pharyngeal -- Pharyngeal- Nectar Straw -- Pharyngeal -- Pharyngeal- Thin Teaspoon -- Pharyngeal -- Pharyngeal- Thin Cup -- Pharyngeal -- Pharyngeal- Thin Straw -- Pharyngeal -- Pharyngeal- Puree Delayed swallow initiation-pyriform sinuses Pharyngeal -- Pharyngeal- Mechanical Soft -- Pharyngeal -- Pharyngeal- Regular -- Pharyngeal -- Pharyngeal- Multi-consistency -- Pharyngeal -- Pharyngeal- Pill -- Pharyngeal -- Pharyngeal Comment --  CHL IP CERVICAL ESOPHAGEAL PHASE 10/24/2016 Cervical Esophageal Phase WFL Pudding Teaspoon -- Pudding Cup -- Honey Teaspoon -- Honey Cup --  Nectar Teaspoon -- Nectar Cup -- Nectar Straw -- Thin Teaspoon -- Thin Cup -- Thin Straw -- Puree -- Mechanical Soft -- Regular -- Multi-consistency -- Pill -- Cervical Esophageal Comment -- CHL IP GO 10/24/2016 Functional Assessment Tool Used NOMS; clinical judgment Functional Limitations Swallowing Swallow Current Status (T5176) CK Swallow Goal Status (H6073) CJ Swallow Discharge Status (X1062) (None) Motor Speech Current Status (I9485) (None) Motor Speech Goal Status (I6270) (None) Motor Speech Goal Status (J5009) (None) Spoken Language Comprehension Current Status (F8182) (None) Spoken Language Comprehension Goal Status (X9371) (None) Spoken Language Comprehension Discharge Status (I9678) (None) Spoken Language Expression Current Status (L3810) (None) Spoken Language Expression Goal Status (F7510) (None) Spoken Language Expression Discharge Status (C5852) (None) Attention Current Status (D7824) (None) Attention Goal Status (M3536) (None) Attention Discharge Status (R4431) (None) Memory Current Status (V4008) (None) Memory Goal Status (Q7619) (None) Memory Discharge Status (J0932) (None) Voice Current Status (I7124) (None) Voice Goal Status (P8099) (None) Voice Discharge Status (I3382) (None) Other Speech-Language Pathology Functional Limitation Current Status (N0539) (None) Other Speech-Language Pathology Functional Limitation Goal Status (J6734) (None) Other Speech-Language Pathology Functional Limitation Discharge Status (L9379) (None) Elvina Sidle, M.S., CCC-SLP 10/24/2016, 3:38 PM               echocardiogram  LV EF: 60% -   65%  ------------------------------------------------------------------- Indications:      CVA 436.  ------------------------------------------------------------------- History:   Risk factors:  Hypertension.  ------------------------------------------------------------------- Study Conclusions  - Left ventricle: The cavity size was normal. Wall thickness was   increased  in a pattern of mild LVH. Systolic function was normal.   The estimated ejection fraction was in the range of 60% to 65%.   Wall motion was normal; there were no regional wall motion   abnormalities. - Atrial septum: The interatrial septum was hypermobile. A patent   foramen ovale cannot be excluded. 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 - Recommendations: Consider transesophageal echocardiography if   clinically indicated.      Filed Weights  10/22/16 0314 10/23/16 0344 10/24/16 0456  Weight: 74 kg (163 lb 2.3 oz) 75.4 kg (166 lb 3.6 oz) 77.3 kg (170 lb 6.4 oz)     Microbiology: Recent Results (from the past 240 hour(s))  MRSA PCR Screening     Status: None   Collection Time: 10/20/16  4:18 AM  Result Value Ref Range Status   MRSA by PCR NEGATIVE NEGATIVE Final    Comment:        The GeneXpert MRSA Assay (FDA approved for NASAL specimens only), is one component of a comprehensive MRSA colonization surveillance program. It is not intended to diagnose MRSA infection nor to guide or monitor treatment for MRSA infections.        Blood Culture No results found for: SDES, [place], CULT, REPTSTATUS    Labs: Results for orders placed or performed during the hospital encounter of 10/19/16 (from the past 48 hour(s))  Glucose, capillary     Status: Abnormal   Collection Time: 10/23/16 12:23 PM  Result Value Ref Range   Glucose-Capillary 104 (H) 65 - 99 mg/dL   Comment 1 Notify RN    Comment 2 Document in Chart   Glucose, capillary     Status: Abnormal   Collection Time: 10/23/16  4:07 PM  Result Value Ref Range   Glucose-Capillary 125 (H) 65 - 99 mg/dL   Comment 1 Notify RN    Comment 2 Document in Chart   Glucose, capillary     Status: Abnormal   Collection Time: 10/23/16  7:42 PM  Result Value Ref Range   Glucose-Capillary 143 (H) 65 - 99 mg/dL   Comment 1 Notify RN    Comment 2 Document in Chart   Glucose, capillary     Status: Abnormal    Collection Time: 10/24/16 12:00 AM  Result Value Ref Range   Glucose-Capillary 123 (H) 65 - 99 mg/dL   Comment 1 Notify RN    Comment 2 Document in Chart   Glucose, capillary     Status: Abnormal   Collection Time: 10/24/16  4:05 AM  Result Value Ref Range   Glucose-Capillary 119 (H) 65 - 99 mg/dL   Comment 1 Notify RN    Comment 2 Document in Chart   Glucose, capillary     Status: Abnormal   Collection Time: 10/24/16  7:59 AM  Result Value Ref Range   Glucose-Capillary 109 (H) 65 - 99 mg/dL  Glucose, capillary     Status: Abnormal   Collection Time: 10/24/16 11:16 AM  Result Value Ref Range   Glucose-Capillary 158 (H) 65 - 99 mg/dL  Glucose, capillary     Status: Abnormal   Collection Time: 10/24/16  4:18 PM  Result Value Ref Range   Glucose-Capillary 143 (H) 65 - 99 mg/dL  Glucose, capillary     Status: Abnormal   Collection Time: 10/24/16  8:19 PM  Result Value Ref Range   Glucose-Capillary 138 (H) 65 - 99 mg/dL   Comment 1 Notify RN    Comment 2 Document in Chart   Glucose, capillary     Status: Abnormal   Collection Time: 10/25/16 12:19 AM  Result Value Ref Range   Glucose-Capillary 123 (H) 65 - 99 mg/dL   Comment 1 Notify RN    Comment 2 Document in Chart   Glucose, capillary     Status: Abnormal   Collection Time: 10/25/16  3:53 AM  Result Value Ref Range   Glucose-Capillary 127 (H) 65 - 99 mg/dL  Comment 1 Notify RN    Comment 2 Document in Chart   CBC     Status: Abnormal   Collection Time: 10/25/16  5:05 AM  Result Value Ref Range   WBC 18.8 (H) 4.0 - 10.5 K/uL   RBC 3.14 (L) 4.22 - 5.81 MIL/uL   Hemoglobin 8.8 (L) 13.0 - 17.0 g/dL   HCT 27.0 (L) 39.0 - 52.0 %   MCV 86.0 78.0 - 100.0 fL   MCH 28.0 26.0 - 34.0 pg   MCHC 32.6 30.0 - 36.0 g/dL   RDW 15.5 11.5 - 15.5 %   Platelets 392 150 - 400 K/uL  Comprehensive metabolic panel     Status: Abnormal   Collection Time: 10/25/16  5:05 AM  Result Value Ref Range   Sodium 138 135 - 145 mmol/L   Potassium  4.1 3.5 - 5.1 mmol/L   Chloride 106 101 - 111 mmol/L   CO2 25 22 - 32 mmol/L   Glucose, Bld 154 (H) 65 - 99 mg/dL   BUN 18 6 - 20 mg/dL   Creatinine, Ser 1.09 0.61 - 1.24 mg/dL   Calcium 8.8 (L) 8.9 - 10.3 mg/dL   Total Protein 6.3 (L) 6.5 - 8.1 g/dL   Albumin 1.8 (L) 3.5 - 5.0 g/dL   AST 50 (H) 15 - 41 U/L   ALT 55 17 - 63 U/L   Alkaline Phosphatase 133 (H) 38 - 126 U/L   Total Bilirubin 0.5 0.3 - 1.2 mg/dL   GFR calc non Af Amer >60 >60 mL/min   GFR calc Af Amer >60 >60 mL/min    Comment: (NOTE) The eGFR has been calculated using the CKD EPI equation. This calculation has not been validated in all clinical situations. eGFR's persistently <60 mL/min signify possible Chronic Kidney Disease.    Anion gap 7 5 - 15  Glucose, capillary     Status: Abnormal   Collection Time: 10/25/16  7:59 AM  Result Value Ref Range   Glucose-Capillary 147 (H) 65 - 99 mg/dL     Lipid Panel     Component Value Date/Time   CHOL 92 10/21/2016 0253   TRIG 103 10/21/2016 0253   HDL 14 (L) 10/21/2016 0253   CHOLHDL 6.6 10/21/2016 0253   VLDL 21 10/21/2016 0253   LDLCALC 57 10/21/2016 0253     Lab Results  Component Value Date   HGBA1C 5.4 10/21/2016   HGBA1C 5.4 10/19/2016        HPI :  66 y.o.malewith history of HTN who presented to [facility] on 10/15/2016 after family found him on the floor confused. MRI showed multiple acute infarcts involving both hemispheres of the cerebellum, the right occipital lobe. right midbrain, and left thalamus.  The fourth ventricle was patent/no hydrocephalus. TTE noted EF 55-60% w/ normal LV size. Carotid doppler showed right ICA 40-59% stenosis with left 60-79% stenosis. CXR noted infiltrates and patient was febrile therefore the pt was started on antibiotics. Creatinine at admission was 2.6, but this improved to 1.5.  CK of 7000 was noted at the time of admission. Patient's mentation slowly improved.  Family requested transfer to [facility], and this was accomplished on 7/5.  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 Continue  with parenteral feeding. Assess need for PEG placement. Neuro suggests "dual antiplatelet therapy for 3 months followed by Plavix alone" - will need extensive PT/OT and SLP - transferred to CIR . still NPO   Extensive left lung Aspiration pneumonia with possible mucous plugging, persistent leukocytosis   completed cefepime after a seven-day course  - nebs as needed - saturations greater than 90% without supplemental oxygen - follow-up chest x-ray has confirmed improvement -  - is afebrile presently,   Dysphagia SLP following - Cortrak in place for tube feeds and medications - suspect patient will need long-term feeding tube given the extent of his deficits - agree w/ Neuro that early PEG indicated unless surprising results noted from f/u SLP eval - will need PEG tube placement by IR    Hypernatremia , improving Continue - free water via feeding tube    HLD cont treatment via feeding tube  HTN BP meds being slowly titrated - adjustments made 7/8 - will make no changes today and follow   Rhabdomyolysis CK normalized  Acute renal failure Creatinine peaked at 2.6 - now 1.09    History of alcoholism Patient essentially beyond the window for even late alcohol withdrawal - mildy confused but no agitation noted today   Mild transaminits  Improving - likely simple shock liver in setting fo EtOH abuse  - viral hep panel pending   Tobacco abuse-cessation advised  Severe protein calorie malnutrition in context of acute illness-continue Jevity     Discharge Exam:  Blood pressure (!) 154/64, pulse (!) 109, temperature 100.3 F (37.9  C), temperature source Axillary, resp. rate 20, height 5' 11"  (1.803 m), weight 77.3 kg (170 lb 6.4 oz), SpO2 94 %.  General: No acute respiratory distress Lungs: mild L basilar crackles with good air movement throughout other fields with no wheeze Cardiovascular: RRR w/o M gallop or rub Abdomen: Nondistended, soft, bowel sounds positive, no rebound, no ascites, no appreciable mass Extremities: No edema bilateral lower extremities  Neurology Severe dysarthria and  at times difficult to understand. Follows simple commands well. Extraocular movements are full range without nystagmus. Blinks to threat more on the right than the left. Pupils irregular but reactive. Fundi were not visualized. Left lower facial weakness. Tongue midline. Weak cough and gag   Follow-up Information    Garvin Fila, MD. Call.   Specialties:  Neurology, Radiology Why:  Following appointment, follow-up in 4-6 weeks Contact information: [address] [place] [address] [phone]           Signed: Reyne Dumas 10/25/2016, 9:27 AM        Time spent >1 hour

## 2016-10-25 NOTE — Progress Notes (Signed)
Inpatient Rehabilitation  I received a denial from [facility] for IP Rehab admission coverage.  I have notified patient and his sister, [name] that it is due to therapy tolerance.  Updated Vida Roller RN CM.  Please call with questions.   Carmelia Roller., CCC/SLP Admission Coordinator  [place]  Cell [phone]
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
## 2016-10-25 NOTE — Progress Notes (Signed)
Radiology notified of confirmation CT of abdomen only, contrast can be given via NG tube. RN will continue to monitor and administer contrast upon medication arrival to unit.

## 2016-10-26 ENCOUNTER — Inpatient Hospital Stay (HOSPITAL_COMMUNITY): Payer: Medicare HMO

## 2016-10-26 LAB — GLUCOSE, CAPILLARY
Glucose-Capillary: 103 mg/dL — ABNORMAL HIGH (ref 65–99)
Glucose-Capillary: 134 mg/dL — ABNORMAL HIGH (ref 65–99)
Glucose-Capillary: 135 mg/dL — ABNORMAL HIGH (ref 65–99)
Glucose-Capillary: 160 mg/dL — ABNORMAL HIGH (ref 65–99)
Glucose-Capillary: 169 mg/dL — ABNORMAL HIGH (ref 65–99)
Glucose-Capillary: 87 mg/dL (ref 65–99)

## 2016-10-26 MED ORDER — AMOXICILLIN-POT CLAVULANATE 600-42.9 MG/5ML PO SUSR
875.0000 mg | Freq: Two times a day (BID) | ORAL | Status: DC
  Administered 2016-10-26 – 2016-10-28 (×6): 875 mg
  Filled 2016-10-26 (×9): qty 7.3

## 2016-10-26 MED ORDER — WHITE PETROLATUM GEL
Status: AC
  Administered 2016-10-26: 18:00:00
  Filled 2016-10-26: qty 1

## 2016-10-26 NOTE — Progress Notes (Signed)
Nutrition Follow Up  DOCUMENTATION CODES:   Severe malnutrition in context of acute illness/injury  INTERVENTION:    Continue Jevity 1.2 at goal rate of 75 ml/h (1800 ml per day)  Provides 2160 kcal, 100 gm protein, 1458 ml free water daily  NUTRITION DIAGNOSIS:   Malnutrition related to acute illness as evidenced by severe depletion of body fat, severe depletion of muscle mass, energy intake < or equal to 50% for > or equal to 5 days, ongoing  GOAL:   Patient will meet greater than or equal to 90% of their needs, met  MONITOR:   TF tolerance, Labs, I & O's  ASSESSMENT:   66 yo male with PMH of HTN, stroke who was admitted to [facility] on 7/1 after family found him on the floor confused. MRI showed multiple acute infarcts. He was transferred to [facility] on 7/5 at family's request.   Jevity 1.2 formula infusing at goal rate of 75 ml/hr via 10 fr Cortrak feeding tube. Tip in duodenum. S/p MBSS 7/10. Pt with moderate pharyngeal phase dysphagia. SLP rec continued NPO status. Labs and medications reviewed. Free water flushes at 300 ml every 4 hours. PT recommending [facility] IP Rehab admission. PEG tube pending. CBG's [id number].  Diet Order:  Diet NPO time specified Diet - low sodium heart healthy  Skin:  Wound (see comment) (stage II to R buttocks, L shoulder; DTI to heels)  Last BM:  7/5  Height:   Ht Readings from Last 1 Encounters:  10/19/16 5' 11"  (1.803 m)   Weight: >>> stable  Wt Readings from Last 1 Encounters:  10/26/16 168 lb 1.6 oz (76.2 kg)   Ideal Body Weight:  78.2 kg  BMI:  Body mass index is 23.45 kg/m.  Estimated Nutritional Needs:   Kcal:  2000-2200  Protein:  100-120 gm  Fluid:  2-2.2 L  EDUCATION NEEDS:   No education needs identified at this time  Arthur Holms, RD, LDN Pager #: [phone] After-Hours Pager #: [phone]

## 2016-10-26 NOTE — Clinical Social Work Note (Signed)
Clinical Social Work Assessment  Patient Details  Name: Scott Rivera MRN: [id number] Date of Birth: Dec 03, 1950  Date of referral:  10/25/16               Reason for consult:  Facility Placement, Discharge Planning                Permission sought to share information with:  Facility Sport and exercise psychologist, Family Supports Permission granted to share information::  Yes, Verbal Permission Granted  Name::     Medical illustrator::  SNF  Relationship::  Sister  Contact Information:     Housing/Transportation Living arrangements for the past 2 months:  Apartment Source of Information:  Other (Comment Required) (Sister) Patient Interpreter Needed:  None Criminal Activity/Legal Involvement Pertinent to Current Situation/Hospitalization:  No - Comment as needed Significant Relationships:  Siblings Lives with:  Self Do you feel safe going back to the place where you live?  Yes Need for family participation in patient care:  Yes (Comment) (patient unable to communicate; disoriented)  Care giving concerns:  Patient currently needs assistance with mobility during a short rehab stay prior to returning home in order to successfully complete ADLs more independently.   Social Worker assessment / plan:  CSW introduced self to patient's sister, [name], at bedside and explained role. Patient's sister indicated that she understood the need for SNF placement for rehab and appreciated the RNCM explaining the situation to her. Patient's sister indicated she had looked at the facility options and had a preference for [facility], [facility] in [place], or [facility], in that order, for SNF placement. CSW explained referral process and prior approval through insurance, and would keep the sister updated on progress.  Employment status:  Retired Nurse, adult PT Recommendations:  Inpatient [place] / Referral to community resources:  [place]  Patient/Family's Response to care:  Patient's sister agreeable to SNF placement for patient.  Patient/Family's Understanding of and Emotional Response to Diagnosis, Current Treatment, and Prognosis:  Patient's sister acknowledged that she is overwhelmed with the patient's current health status and wanting him to be able to get better. Patient's sister indicated understanding of SNF placement and CSW role in facilitating discharge.  Emotional Assessment Appearance:  Appears stated age Attitude/Demeanor/Rapport:  Unable to Assess Affect (typically observed):  Unable to Assess Orientation:  Oriented to Self Alcohol / Substance use:  Not Applicable Psych involvement (Current and /or in the community):  No (Comment)  Discharge Needs  Concerns to be addressed:  Care Coordination, Discharge Planning Concerns Readmission within the last 30 days:  No Current discharge risk:  Lives alone, Physical Impairment Barriers to Discharge:  Continued Medical Work up, [place], [place] 10/26/2016, 9:22 AM

## 2016-10-26 NOTE — Progress Notes (Signed)
Chaplain presented to the patient's room, he was sleeping at the time, but his sister was present. She assist with his daily and health care decisions. She was provided with an Advance Directive, and assisted with how to complete the form. She was advised to have the Nursing staff inform Spiritual Care when it is ready to be notarized. Chaplain Yaakov Guthrie [phone]

## 2016-10-26 NOTE — Consult Note (Signed)
Chief Complaint: Patient was seen in consultation today for dysphagia  Referring Physician(s):  Dr. Reyne Dumas  Supervising Physician: Corrie Mckusick  Patient Status: [facility] - In-pt  History of Present Illness: WALTER GRIMA is a 66 y.o. male with past medical history of HTN admitted to [facility] 10/15/16 with complaints of confusion.  Patient was found to have acute CVA. He was transferred to [facility] for further management.  He has continued with difficulty swallowing and was last assessed with MBS 10/24/16 which demonstrated moderate-severe risk for aspiration.   IR consulted for gastrostomy placement at the request of Dr. Allyson Sabal.   Patient underwent CT Abdomen yesterday which shows a small window for possible percutaneous approach for gastrostomy.  Patient also with surgical clips from prior abdominal surgery.  Discussed with Dr. Earleen Newport who agrees patient is appropriate for attempt at percutaneous gastrostomy.   Patient did receive tubefeeding this morning. Patient also currently on Plavix due to recent stroke.   Past Medical History:  Diagnosis Date  . Hypertension   . Stroke [facility])     Past Surgical History:  Procedure Laterality Date  . KNEE SURGERY      Allergies: Patient has no known allergies.  Medications: Prior to Admission medications   Medication Sig Start Date End Date Taking? Authorizing Provider  amLODipine-benazepril (LOTREL) 10-40 MG capsule Take 1 capsule by mouth daily.   Yes [provider]  lovastatin (MEVACOR) 20 MG tablet Take 20 mg by mouth every evening.    Yes [provider]  aspirin 325 MG tablet Place 1 tablet (325 mg total) into feeding tube daily. 10/25/16   Reyne Dumas, MD  bethanechol (URECHOLINE) 10 MG tablet Place 1 tablet (10 mg total) into feeding tube 3 (three) times daily. 10/25/16   Reyne Dumas, MD  clopidogrel (PLAVIX) 75 MG tablet Place 1 tablet (75 mg total) into feeding tube daily. 10/25/16   Reyne Dumas, MD    metoprolol tartrate (LOPRESSOR) 25 mg/10 mL SUSP Place 10 mLs (25 mg total) into feeding tube 2 (two) times daily. 10/25/16   Reyne Dumas, MD  nicotine (NICODERM CQ - DOSED IN MG/24 HOURS) 14 mg/24hr patch Place 1 patch (14 mg total) onto the skin daily. 10/25/16   Reyne Dumas, MD  Nutritional Supplements (FEEDING SUPPLEMENT, JEVITY 1.2 CAL,) LIQD Place 1,000 mLs into feeding tube continuous. 10/25/16   Reyne Dumas, MD  pravastatin (PRAVACHOL) 20 MG tablet Place 1 tablet (20 mg total) into feeding tube daily at 6 PM. 10/25/16   Reyne Dumas, MD  traMADol (ULTRAM) 50 MG tablet Take 1 tablet (50 mg total) by mouth 2 (two) times daily as needed for severe pain. 10/25/16   Reyne Dumas, MD  Water For Irrigation, Sterile (FREE WATER) SOLN Place 300 mLs into feeding tube every 4 (four) hours. 10/25/16   Reyne Dumas, MD     Family History  Problem Relation Age of Onset  . Diabetes Mellitus II Brother   . CAD Neg Hx   . Stroke Neg Hx     Social History   Social History  . Marital status: Single    Spouse name: N/A  . Number of children: N/A  . Years of education: N/A   Social History Main Topics  . Smoking status: Current Every Day Smoker  . Smokeless tobacco: Never Used  . Alcohol use Yes  . Drug use: Unknown  . Sexual activity: Not Asked   Other Topics Concern  . None   Social History Narrative  .  None    Review of Systems  Unable to perform ROS: Mental status change    Vital Signs: BP 121/72 (BP Location: Right Leg)   Pulse (!) 104   Temp 98.2 F (36.8 C) (Axillary)   Resp 20   Ht 5\' 11"  (1.803 m)   Wt 168 lb 1.6 oz (76.2 kg) Comment: Patient has air flow specialty bed  SpO2 94%   BMI 23.45 kg/m   Physical Exam  Constitutional: He appears well-developed.  Cardiovascular: Normal rate, regular rhythm and normal heart sounds.   Pulmonary/Chest: Effort normal and breath sounds normal. No respiratory distress.  Abdominal: Soft.  Midline scar from previous  abdminal surgery  Neurological:  Nonverbal, not responsive  Skin: Skin is warm and dry.  Nursing note and vitals reviewed.   Mallampati Score:  MD Evaluation Airway: WNL Heart: WNL Abdomen: WNL Chest/ Lungs: WNL ASA  Classification: 3 Mallampati/Airway Score: Two  Imaging: Ct Abdomen Wo Contrast  Result Date: 10/26/2016 CLINICAL DATA:  66 year old male with history of stroke and dysphagia. EXAM: CT ABDOMEN WITHOUT CONTRAST TECHNIQUE: Multidetector CT imaging of the abdomen was performed following the standard protocol without IV contrast. COMPARISON:  Abdominal ultrasound dated 10/16/2016 FINDINGS: Evaluation of this exam is limited in the absence of intravenous contrast. Evaluation is also limited due to streak artifact caused by patient's arms. Lower chest: Partially visualized small left pleural effusion. There is a focal area of consolidative change at the left lung base which may represent atelectasis/ scarring although pneumonia is not excluded. Clinical correlation is recommended. No intra-abdominal free air.  Small subhepatic free fluid. Hepatobiliary: Small left hepatic calcified focus most consistent with a granuloma. The liver is otherwise unremarkable as visualized. The gallbladder is not visualized and may be contracted or surgically absent. Pancreas: The pancreas is grossly unremarkable. Spleen: Normal in size without focal abnormality. Adrenals/Urinary Tract: The adrenal glands are unremarkable. The left kidney is atrophic. There is no hydronephrosis or nephrolithiasis on the right. There is mild right perinephric stranding, nonspecific. Correlation with urinalysis recommended to exclude UTI. Stomach/Bowel: An enteric tube is noted coursing through the stomach with tip in the distal third portion of the duodenum. Oral contrast noted within the small bowel and colon. No bowel dilatation or active inflammation. Vascular/Lymphatic: There is advanced atherosclerotic calcification of  the abdominal aorta. The aorta and IVC are otherwise grossly unremarkable on this noncontrast study. No portal venous gas. There is no adenopathy. Other: Midline anterior abdominal wall surgical clips noted. No fluid collection. Musculoskeletal: There is multilevel degenerative changes of the spine. No acute fracture. IMPRESSION: 1. Small left pleural effusion and focal left lung base consolidative change, likely atelectasis or scarring. Pneumonia is not excluded. Clinical correlation is recommended. 2. Mild right perinephric stranding, nonspecific. Correlation with urinalysis recommended to exclude UTI. Atrophic left kidney. 3. No evidence of bowel obstruction. Enteric tube with tip in the distal duodenum. 4.  Aortic Atherosclerosis (ICD10-I70.0). Electronically Signed   By: Anner Crete M.D.   On: 10/26/2016 04:42   Ct Angio Head W Or Wo Contrast  Result Date: 10/20/2016 CLINICAL DATA:  Posterior circulation infarcts EXAM: CT ANGIOGRAPHY HEAD AND NECK TECHNIQUE: Multidetector CT imaging of the head and neck was performed using the standard protocol during bolus administration of intravenous contrast. Multiplanar CT image reconstructions and MIPs were obtained to evaluate the vascular anatomy. Carotid stenosis measurements (when applicable) are obtained utilizing NASCET criteria, using the distal internal carotid diameter as the denominator. CONTRAST:  50 cc Isovue 370  intravenous COMPARISON:  Head CT and abbreviated brain MRI 10/17/2016 and 10/16/2016 respectively FINDINGS: CT HEAD FINDINGS Brain: From head CT 3 days prior there is stable acute infarction in the left more than right cerebellum, right midbrain, left thalamus, and right occipital lobe. Extensive remote infarction in the perisylvian left MCA territory and distal left ACA territories. No hemorrhagic conversion. Upper fourth ventricular narrowing without hydrocephalus. Vascular: Atherosclerotic calcification. Skull: No acute or aggressive  finding Sinuses: Mild patchy mucosal thickening. Orbits: Negative Review of the MIP images confirms the above findings CTA NECK FINDINGS Aortic arch: Partly covered. Predominant noncalcified atheromatous wall thickening. No evidence of dissection. Right carotid system: Intermittently motion degraded. There is predominately calcified plaque on the distal common carotid, carotid bifurcation, and proximal ICA without suspected flow limiting stenosis or dissection. Left carotid system: Noncalcified plaque narrows the proximal common carotid artery by 40%. Motion from swallowing significant degrades evaluation of the common carotid bifurcation where there is a large calcified plaque. No distal ICA stenosis P Vertebral arteries: Proximal subclavian atherosclerosis without flow limiting stenosis. Noncalcified plaque causes severe narrowing of the proximal left vertebral artery. No evidence of dissection. No ulceration. Right vertebral artery is mildly narrowed at the V1 segment due to atheromatous plaque. Thyrocervical trunk and vertebral artery share a common origin on the right. Skeleton: Advanced cervical disc degeneration. Advanced left sternoclavicular joint osteoarthritis. Other neck: No incidental mass or inflammation noted. Solitary right thyroid lobe. Upper chest: Patchy lung opacity and pleural fluid on the left, known from chest x-ray yesterday Review of the MIP images confirms the above findings CTA HEAD FINDINGS Anterior circulation: Confluent atherosclerotic plaque on the carotid siphons without flow limiting stenosis. Comparatively less number of left MCA branches in the setting of prior infarct. No acute occlusion identified. Negative for beading or aneurysm. Posterior circulation: Codominant vertebral arteries. Both picas are patent. Calcified plaque on the right V4 segment without significant stenosis. Mild proximal basilar narrowing best seen in the sagittal projection. Mild atheromatous narrowing of  right P1 segment. Venous sinuses: Patent Anatomic variants: None significant. Delayed phase: No abnormal intracranial enhancement. Review of the MIP images confirms the above findings IMPRESSION: 1. No acute arterial finding. 2. Advanced left and mild right V1 segment atheromatous stenosis. No visible ulcerated plaque. 3. Motion artifact from swallowing significantly degrades evaluation of the cervical carotids, especially on the left. No noted flow limiting stenosis. 4. Diminished number of left MCA branches in the setting of remote infarct. 5. No visible progression or hemorrhagic conversion of the acute posterior circulation infarcts. 6. Remote left MCA and ACA territory infarcts. Electronically Signed   By: Monte Fantasia M.D.   On: 10/20/2016 14:09   Ct Angio Neck W Or Wo Contrast  Result Date: 10/20/2016 CLINICAL DATA:  Posterior circulation infarcts EXAM: CT ANGIOGRAPHY HEAD AND NECK TECHNIQUE: Multidetector CT imaging of the head and neck was performed using the standard protocol during bolus administration of intravenous contrast. Multiplanar CT image reconstructions and MIPs were obtained to evaluate the vascular anatomy. Carotid stenosis measurements (when applicable) are obtained utilizing NASCET criteria, using the distal internal carotid diameter as the denominator. CONTRAST:  50 cc Isovue 370 intravenous COMPARISON:  Head CT and abbreviated brain MRI 10/17/2016 and 10/16/2016 respectively FINDINGS: CT HEAD FINDINGS Brain: From head CT 3 days prior there is stable acute infarction in the left more than right cerebellum, right midbrain, left thalamus, and right occipital lobe. Extensive remote infarction in the perisylvian left MCA territory and distal left ACA territories.  No hemorrhagic conversion. Upper fourth ventricular narrowing without hydrocephalus. Vascular: Atherosclerotic calcification. Skull: No acute or aggressive finding Sinuses: Mild patchy mucosal thickening. Orbits: Negative Review  of the MIP images confirms the above findings CTA NECK FINDINGS Aortic arch: Partly covered. Predominant noncalcified atheromatous wall thickening. No evidence of dissection. Right carotid system: Intermittently motion degraded. There is predominately calcified plaque on the distal common carotid, carotid bifurcation, and proximal ICA without suspected flow limiting stenosis or dissection. Left carotid system: Noncalcified plaque narrows the proximal common carotid artery by 40%. Motion from swallowing significant degrades evaluation of the common carotid bifurcation where there is a large calcified plaque. No distal ICA stenosis P Vertebral arteries: Proximal subclavian atherosclerosis without flow limiting stenosis. Noncalcified plaque causes severe narrowing of the proximal left vertebral artery. No evidence of dissection. No ulceration. Right vertebral artery is mildly narrowed at the V1 segment due to atheromatous plaque. Thyrocervical trunk and vertebral artery share a common origin on the right. Skeleton: Advanced cervical disc degeneration. Advanced left sternoclavicular joint osteoarthritis. Other neck: No incidental mass or inflammation noted. Solitary right thyroid lobe. Upper chest: Patchy lung opacity and pleural fluid on the left, known from chest x-ray yesterday Review of the MIP images confirms the above findings CTA HEAD FINDINGS Anterior circulation: Confluent atherosclerotic plaque on the carotid siphons without flow limiting stenosis. Comparatively less number of left MCA branches in the setting of prior infarct. No acute occlusion identified. Negative for beading or aneurysm. Posterior circulation: Codominant vertebral arteries. Both picas are patent. Calcified plaque on the right V4 segment without significant stenosis. Mild proximal basilar narrowing best seen in the sagittal projection. Mild atheromatous narrowing of right P1 segment. Venous sinuses: Patent Anatomic variants: None  significant. Delayed phase: No abnormal intracranial enhancement. Review of the MIP images confirms the above findings IMPRESSION: 1. No acute arterial finding. 2. Advanced left and mild right V1 segment atheromatous stenosis. No visible ulcerated plaque. 3. Motion artifact from swallowing significantly degrades evaluation of the cervical carotids, especially on the left. No noted flow limiting stenosis. 4. Diminished number of left MCA branches in the setting of remote infarct. 5. No visible progression or hemorrhagic conversion of the acute posterior circulation infarcts. 6. Remote left MCA and ACA territory infarcts. Electronically Signed   By: Monte Fantasia M.D.   On: 10/20/2016 14:09   Dg Chest Port 1 View  Result Date: 10/26/2016 CLINICAL DATA:  Fever. EXAM: PORTABLE CHEST 1 VIEW COMPARISON:  10/22/2016 FINDINGS: Feeding tube at least reaches the stomach. Streaky perihilar opacities, greater on the left, possibly from underlying aspiration given the presenting complaint. Aeration continues to improve. No Kerley lines, effusion, or pneumothorax. Surgical clips overlapping the left apex. 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 IMPRESSION: 1. Continued improvement in aeration. 2. Left greater than right perihilar atelectasis or pneumonia, possibly from underlying aspiration given stroke presentation. Electronically Signed   By: Monte Fantasia M.D.   On: 10/26/2016 09:23   Dg Chest Port 1 View  Result Date: 10/22/2016 CLINICAL DATA:  Pneumonia. EXAM: PORTABLE CHEST 1 VIEW COMPARISON:  10/19/2016 FINDINGS: 0543 hours. A feeding tube passes into the stomach although the distal tip position is not included on the film. Cardiopericardial silhouette is at upper limits of normal for size. The retrocardiac left base collapse/ consolidation has decreased in the interval. Right lung remains clear. The visualized bony structures of the thorax are intact. Telemetry leads overlie the chest. IMPRESSION: Interval  improvement in aeration at the left base. Otherwise stable  exam. Electronically Signed   By: Misty Stanley M.D.   On: 10/22/2016 07:20   Dg Chest Port 1 View  Result Date: 10/19/2016 CLINICAL DATA:  Shortness of breath. EXAM: PORTABLE CHEST 1 VIEW COMPARISON:  Frontal and lateral views earlier this day. Radiographs yesterday and 10/15/2016 FINDINGS: Increasing volume loss in the left lung with progressive patchy opacities in the perihilar and lower lung zone. Leftward mediastinal shift likely secondary to volume loss. The heart is normal in size. No focal abnormality in the right lung. Difficult to exclude left pleural effusion. Sclerotic density in the left humeral head is unchanged. IMPRESSION: Increasing volume loss in the left hemithorax with progressive left lung opacities. Suspect aspiration/mucous plugging versus increasing pneumonia. Electronically Signed   By: Jeb Levering M.D.   On: 10/19/2016 21:29   Dg Swallowing Func-speech Pathology  Result Date: 10/24/2016 Objective Swallowing Evaluation: Type of Study: MBS-Modified Barium Swallow Study Patient Details Name: LAMINE LATON MRN: [id number] Date of Birth: 23-Nov-1950 Today's Date: 10/24/2016 Time: SLP Start Time (ACUTE ONLY): 1350-SLP Stop Time (ACUTE ONLY): 1404 SLP Time Calculation (min) (ACUTE ONLY): 14 min Past Medical History: Past Medical History: Diagnosis Date . Hypertension  . Stroke [facility])  Past Surgical History: Past Surgical History: Procedure Laterality Date . KNEE SURGERY   HPI: 66 y.o. male with history of hypertension was brought to [facility] on 10/15/2016 after patient was found to be confused. As per the family who provided most of the history patient was not able to be raised and family went to check on him at home and was found on the floor confused. Patient was taken to the ER. The last contact was 2 days ago by patient's brother prior to the episode. At [facility] showed  multiple acute infarcts involving the both hemisphere of the cerebellum and right occipital lobe right midbrain and left thalamus the fourth ventricle was patent no hydrocephalus. 2-D echo showing EF of 55-60% normal LV size. Carotid Doppler was done which showed right ICA 40-59% stenosis with left 60-79% stenosis. Chest x-ray was showing infiltrates and patient also was febrile for which patient was started on antibiotics. Patient's creatinine at admission was 2.6 which improved to 1.5 the time of discharge. Patient also had rhabdomyolysis with CK of 7000 at the time of admission. As per the note patient was initially agitated requiring Haldol at the other hospital. Patient's mentation slowly improved gradually and family requested transfer to [facility] after discussing with neurologist at [facility] Dr. Leonel Ramsay; BSE completed 10/20/16 indicating need for objective testing; pt remained NPO. No Data Recorded Assessment / Plan / Recommendation CHL IP CLINICAL IMPRESSIONS 10/24/2016 Clinical Impression Pt with moderate pharyngeal phase dysphagia with aspiration noted after the swallow with honey-thickened liquids via 1/2 tsp amount requiring suctioning post-swallow; swallow triggered at pyriform sinuses with puree, honey-thickened liquids and pudding-thickened liquids via spoon d/t impaired sensation; vallecular/pyriform residuals noted with honey-thickened liquids despite independent repetitive swallows; minimal residuals in valleculae/pyriforms noted with pudding-thick consistencies/puree consistencies and cleared to an insignificant amount with repetitive swallow; recommend pt remain NPO with ST providing comfort feeding with pudding-thickened liquids/puree only with nutritional support provided via Cortrak until pt mentation and strength improves for diet upgrade d/t safety purposes/moderate-severe risk of aspiration d/t pt LOA, recent change in status, weak, congested cough and severity of dysphagia  SLP Visit Diagnosis Dysphagia, pharyngeal phase (R13.13) Attention and concentration deficit following -- Frontal lobe and executive function deficit following --  Impact on safety and function Moderate aspiration risk;Severe aspiration risk   CHL IP TREATMENT RECOMMENDATION 10/24/2016 Treatment Recommendations Therapy as outlined in treatment plan below   Prognosis 10/24/2016 Prognosis for Safe Diet Advancement Fair Barriers to Reach Goals Cognitive deficits;Severity of deficits Barriers/Prognosis Comment -- CHL IP DIET RECOMMENDATION 10/24/2016 SLP Diet Recommendations NPO;Other (comfort feeding provided by ST only with pudding-thick liquids/puree) Liquid Administration via Spoon Medication Administration Other (via alternative means) Compensations -- Postural Changes --   CHL IP OTHER RECOMMENDATIONS 10/24/2016 Recommended Consults -- Oral Care Recommendations Oral care QID Other Recommendations --   CHL IP FOLLOW UP RECOMMENDATIONS 10/24/2016 Follow up Recommendations Other (comment)   CHL IP FREQUENCY AND DURATION 10/24/2016 Speech Therapy Frequency (ACUTE ONLY) min 2x/week Treatment Duration 1 week      CHL IP ORAL PHASE 10/24/2016 Oral Phase WFL Oral - Pudding Teaspoon -- Oral - Pudding Cup -- Oral - Honey Teaspoon -- Oral - Honey Cup -- Oral - Nectar Teaspoon -- Oral - Nectar Cup -- Oral - Nectar Straw -- Oral - Thin Teaspoon -- Oral - Thin Cup -- Oral - Thin Straw -- Oral - Puree -- Oral - Mech Soft -- Oral - Regular -- Oral - Multi-Consistency -- Oral - Pill -- Oral Phase - Comment --  CHL IP PHARYNGEAL PHASE 10/24/2016 Pharyngeal Phase Impaired Pharyngeal- Pudding Teaspoon Delayed swallow initiation-pyriform sinuses Pharyngeal Material does not enter airway Pharyngeal- Pudding Cup -- Pharyngeal -- Pharyngeal- Honey Teaspoon Delayed swallow initiation-pyriform sinuses;Reduced airway/laryngeal closure;Penetration/Apiration after swallow;Moderate aspiration;Pharyngeal residue - valleculae;Pharyngeal residue -  pyriform;Compensatory strategies attempted (with notebox) Pharyngeal Material enters airway, passes BELOW cords and not ejected out despite cough attempt by patient Pharyngeal- Honey Cup -- Pharyngeal -- Pharyngeal- Nectar Teaspoon -- Pharyngeal -- Pharyngeal- Nectar Cup -- Pharyngeal -- Pharyngeal- Nectar Straw -- Pharyngeal -- Pharyngeal- Thin Teaspoon -- Pharyngeal -- Pharyngeal- Thin Cup -- Pharyngeal -- Pharyngeal- Thin Straw -- Pharyngeal -- Pharyngeal- Puree Delayed swallow initiation-pyriform sinuses Pharyngeal -- Pharyngeal- Mechanical Soft -- Pharyngeal -- Pharyngeal- Regular -- Pharyngeal -- Pharyngeal- Multi-consistency -- Pharyngeal -- Pharyngeal- Pill -- Pharyngeal -- Pharyngeal Comment --  CHL IP CERVICAL ESOPHAGEAL PHASE 10/24/2016 Cervical Esophageal Phase WFL Pudding Teaspoon -- Pudding Cup -- Honey Teaspoon -- Honey Cup -- Nectar Teaspoon -- Nectar Cup -- Nectar Straw -- Thin Teaspoon -- Thin Cup -- Thin Straw -- Puree -- Mechanical Soft -- Regular -- Multi-consistency -- Pill -- Cervical Esophageal Comment -- CHL IP GO 10/24/2016 Functional Assessment Tool Used NOMS; clinical judgment Functional Limitations Swallowing Swallow Current Status (O3785) CK Swallow Goal Status (Y8502) CJ Swallow Discharge Status (D7412) (None) Motor Speech Current Status (I7867) (None) Motor Speech Goal Status (E7209) (None) Motor Speech Goal Status (O7096) (None) Spoken Language Comprehension Current Status (G8366) (None) Spoken Language Comprehension Goal Status (Q9476) (None) Spoken Language Comprehension Discharge Status (L4650) (None) Spoken Language Expression Current Status (P5465) (None) Spoken Language Expression Goal Status (K8127) (None) Spoken Language Expression Discharge Status (N1700) (None) Attention Current Status (F7494) (None) Attention Goal Status (W9675) (None) Attention Discharge Status (F1638) (None) Memory Current Status (G6659) (None) Memory Goal Status (D3570) (None) Memory Discharge Status  (V7793) (None) Voice Current Status (J0300) (None) Voice Goal Status (P2330) (None) Voice Discharge Status (Q7622) (None) Other Speech-Language Pathology Functional Limitation Current Status (Q3335) (None) Other Speech-Language Pathology Functional Limitation Goal Status (K5625) (None) Other Speech-Language Pathology Functional Limitation Discharge Status (W3893) (None) Elvina Sidle, M.S., CCC-SLP 10/24/2016, 3:38 PM  Labs:  CBC:  Recent Labs  10/21/16 0253 10/22/16 0147 10/23/16 0330 10/25/16 0505  WBC 17.2* 21.4* 20.9* 18.8*  HGB 10.0* 9.7* 9.5* 8.8*  HCT 30.0* 30.0* 30.1* 27.0*  PLT 278 [phone]    COAGS: No results for input(s): INR, APTT in the last 8760 hours.  BMP:  Recent Labs  10/21/16 0253 10/22/16 0147 10/23/16 0330 10/25/16 0505  NA 148* 149* 151* 138  K 3.5 3.5 3.8 4.1  CL 119* 122* 119* 106  CO2 22 21* 25 25  GLUCOSE 141* 161* 132* 154*  BUN 20 17 19 18   CALCIUM 9.1 9.3 9.3 8.8*  CREATININE 1.53* 1.40* 1.26* 1.09  GFRNONAA 46* 51* 58* >60  GFRAA 53* 59* >60 >60    LIVER FUNCTION TESTS:  Recent Labs  10/21/16 0253 10/22/16 0147 10/23/16 0330 10/25/16 0505  BILITOT 0.8 0.8 0.3 0.5  AST 106* 78* 46* 50*  ALT 67* 75* 59 55  ALKPHOS 144* 176* 151* 133*  PROT 6.7 6.1* 6.5 6.3*  ALBUMIN 1.9* 1.8* 1.9* 1.8*    TUMOR MARKERS: No results for input(s): AFPTM, CEA, CA199, CHROMGRNA in the last 8760 hours.  Assessment and Plan: Patient with history of CVA, now with dysphagia and need for long-term care.  IR consulted for gastrostomy tube placement at the request of Dr. Allyson Sabal.  Patient underwent anatomy scan yesterday which was reviewed with Dr. Earleen Newport today.  Patient has a small window for percutaneous approach given position of stomach as well as placement of surgical clips. Patient continued on tubefeeding overnight pending anatomy review. Patient also with ongoing management of pneumonia, thought to be aspiration pneumonia.  He has  had a persistently elevated WBC and intermittent fevers. Will check fevers tomorrow and discuss with MD in the AM.  Patient did start Augmentin. Patient on Plavix for recent stroke.  Discussion held between Dr. Earleen Newport, Dr. Allyson Sabal, and Dr. Leonie Man regarding patient need for antiplatelet therapy and decision made to continue Plavix at this time.  Will not hold Plavix for procedure. Sister at bedside to discuss procedure and provide consent.  Risks and Benefits discussed with the patient including, but not limited to the need for a barium enema during the procedure, bleeding, infection, peritonitis, or damage to adjacent structures. All of the patient's questions were answered, patient is agreeable to proceed. Consent signed and in chart. RN aware no procedure today.  Will continue to follow and plan for gastrostomy placement as medical status and schedule allows.  Thank you for this interesting consult.  I greatly enjoyed meeting [facility] and look forward to participating in their care.  A copy of this report was sent to the requesting provider on this date.  Electronically Signed: Docia Barrier, PA 10/26/2016, 2:27 PM   I spent a total of 40 Minutes    in face to face in clinical consultation, greater than 50% of which was counseling/coordinating care for dysphagia.

## 2016-10-26 NOTE — Progress Notes (Signed)
STROKE TEAM PROGRESS NOTE   HISTORY OF PRESENT ILLNESS (per record) Scott Rivera is an 66 y.o. male who presented to [facility] on July 1 with complaints of confusion per the family. Last confirmed well was two days prior by the patient's brother. Per H&P note, "MRI showed multiple acute infarcts involving the both hemisphere of the cerebellum and right occipital lobe right midbrain and left thalamus the fourth ventricle was patent no hydrocephalus". Imaging is not present for review at this time. No vascular imaging was performed at [facility]. Patient was transferred to [facility] by request of the family for a second opinion, Dr. Mike Craze accepted.  Scott Rivera is severely dysarthric and understanding him was very difficult. He does not endorse dizziness, double or blurry vision at this time. When asked what happened, he stated that he was sitting on the side of his bed when he "fell out" three days ago. Further questioning regarding the event elicited "I fell out" repeatedly. Unknown baseline cognitive status at this time. Family was not at bedside, and there are no contacts on file.  Date last known well: Date: 10/13/2016 Time last known well: Unable to determine tPA Given: No: Out of window  SUBJECTIVE (INTERVAL HISTORY) No family members present.  He seems more sleepy today . He  Is awaiting PEG tube    OBJECTIVE Temp:  [97.9 F (36.6 C)-100.6 F (38.1 C)] 98.2 F (36.8 C) (07/12 1336) Pulse Rate:  [73-117] 104 (07/12 1336) Cardiac Rhythm: Sinus tachycardia (07/11 1900) Resp:  [19-20] 20 (07/12 1336) BP: (121-164)/(63-88) 121/72 (07/12 1336) SpO2:  [94 %-100 %] 94 % (07/12 1336) Weight:  [168 lb 1.6 oz (76.2 kg)] 168 lb 1.6 oz (76.2 kg) (07/12 0437)  CBC:   Recent Labs Lab 10/19/16 2157  10/23/16 0330 10/25/16 0505  WBC 18.5*  < > 20.9* 18.8*  NEUTROABS 14.9*  --   --   --   HGB 10.6*  < > 9.5* 8.8*  HCT 32.2*  < > 30.1* 27.0*  MCV 88.0  < > 89.1 86.0  PLT 239   < > 324 392  < > = values in this interval not displayed.  Basic Metabolic Panel:   Recent Labs Lab 10/23/16 0330 10/25/16 0505  NA 151* 138  K 3.8 4.1  CL 119* 106  CO2 25 25  GLUCOSE 132* 154*  BUN 19 18  CREATININE 1.26* 1.09  CALCIUM 9.3 8.8*    Lipid Panel:     Component Value Date/Time   CHOL 92 10/21/2016 0253   TRIG 103 10/21/2016 0253   HDL 14 (L) 10/21/2016 0253   CHOLHDL 6.6 10/21/2016 0253   VLDL 21 10/21/2016 0253   LDLCALC 57 10/21/2016 0253   HgbA1c:  Lab Results  Component Value Date   HGBA1C 5.4 10/21/2016   Urine Drug Screen:     Component Value Date/Time   LABOPIA NONE DETECTED 10/19/2016 0050   COCAINSCRNUR NONE DETECTED 10/19/2016 0050   LABBENZ NONE DETECTED 10/19/2016 0050   AMPHETMU NONE DETECTED 10/19/2016 0050   THCU NONE DETECTED 10/19/2016 0050   LABBARB NONE DETECTED 10/19/2016 0050    Alcohol Level No results found for: ETH  IMAGING  Ct Angio Head W Or Wo Contrast Ct Angio Neck W Or Wo Contrast 10/20/2016 1. No acute arterial finding.  2. Advanced left and mild right V1 segment atheromatous stenosis. No visible ulcerated plaque.  3. Motion artifact from swallowing significantly degrades evaluation of the cervical carotids, especially  on the left. No noted flow limiting stenosis.  4. Diminished number of left MCA branches in the setting of remote infarct.  5. No visible progression or hemorrhagic conversion of the acute posterior circulation infarcts.  6. Remote left MCA and ACA territory infarcts.     Dg Chest Port 1 View 10/19/2016 Increasing volume loss in the left hemithorax with progressive left lung opacities. Suspect aspiration/mucous plugging versus increasing pneumonia.    PHYSICAL EXAM Frail middle aged african [place] male in mild resp distress. . Afebrile. Head is nontraumatic. Neck is supple without bruit.    Cardiac exam no murmur or gallop. Lungs are clear to auscultation. Distal pulses are well  felt.  Neurological Exam :  Sleepy but can be aroused. Severe dysarthria and  at times difficult to understand. Follows simple commands well. Extraocular movements are full range without nystagmus. Blinks to threat more on the right than the left. Pupils irregular but reactive. Fundi were not visualized. Left lower facial weakness. Tongue midline. Weak cough and gag. Mild left hemiparesis 4/5. Left upper extremity drift. Left grip and intrinsic hand muscles are weak. Lower extremity strength testing is limited by knee pain and swelling bilaterally but is symmetric with  Diffuse weakness  2-3/5 bilaterally. Deep tendon reflexes are symmetric. Plantars downgoing. Sensation appears intact. Mild left finger-to-nose dysmetria. Gait was not tested.    ASSESSMENT/PLAN Scott Rivera is a 66 y.o. male with history of previous strokes, hypertension and ongoing tobacco use presenting with severe dysarthria. He did not receive IV t-PA due to late presentation.  Stroke:  Multiple acute infarcts in both posterior cerebral hemispheres - likely symptomatic from high-grade proximal left vertebral artery stenosis.  Resultant  dysarthria and left hemiparesis   CT head evolving bilateral cerebellar, right midbrain and left thalamic infarcts  MRI head - OSH - multiple acute infarcts in both hemispheres  MRA head -not performed  CTA H&N - Diminished number of left MCA branches - remote left MCA and ACA territory infarcts.Advanced left and mild right V1 segment atheromatous stenosis Carotid Doppler - CTA neck Noncalcified plaque causes severe narrowing of the proximal left vertebral artery. No evidence of dissection. No ulceration. Right vertebral artery is mildly narrowed at the V1segment due to atheromatous plaque.  2D Echo -Left ventricle: The cavity size was normal. Wall thickness was   increased in a pattern of mild LVH. Systolic function was normal.   The estimated ejection fraction was in the range of  60% to 65%.   Wall motion was normal; there were no regional wall motion    abnormalities.  LDL - 24  HgbA1c - 5.4  VTE prophylaxis - SCDs Diet NPO time specified Diet - low sodium heart healthy  No antithrombotic prior to admission, now on aspirin 325 mg daily  Patient counseled to be compliant with his antithrombotic medications  Ongoing aggressive stroke risk factor management  Therapy recommendations: CIR recommended - rehabilitation M.D. consult has been ordered.  Disposition:  Pending  Hypertension  Stable  Permissive hypertension (OK if < 220/120) but gradually normalize in 5-7 days  Long-term BP goal normotensive  Hyperlipidemia  Home meds: Mevacor 20 mg daily not resumed in hospital  LDL 24, goal < 70  Pt NPO - resume statin when taking POs  Continue statin at discharge    Other Stroke Risk Factors  Advanced age  Cigarette smoker - advised to stop smoking  ETOH use, advised to drink no more than 1 drink per day.  Hx stroke/TIA   Other Active Problems  Aspiration pneumonia - Maxipime started 10/20/16 - leukocytosis  NPO - Jevity TF  At some point will need further workup for embolic source.  Elevated creatinine  Anemia  Hospital day # 7     Continue dual antiplatelet therapy for 3 months followed by Plavix alone. Continue parenteral nutrition and medications as patient has severe dysarthria and dysphagia from his stroke.   PEG tube pending. Continue antibiotics for aspiration pneumonia.     D/w dr Allyson Sabal Antony Contras, MD Medical Director [facility] Pager: [phone] 10/26/2016 1:38 PM   To contact Stroke Continuity provider, please refer to [email or web address] After hours, contact General Neurology

## 2016-10-26 NOTE — NC FL2 (Signed)
[place] LEVEL OF CARE SCREENING TOOL     IDENTIFICATION  Patient Name: Scott Rivera Birthdate: [birth year] Sex: male Admission Date (Current Location): 10/19/2016  [facility] and [place] Number:  Herbalist and Address:  [facility], [place] [address], [place], [place] [address]      Provider Number: [id number]  Attending Physician Name and Address:  Reyne Dumas, MD  Relative Name and Phone Number:       Current Level of Care: Hospital Recommended Level of Care: [place] Prior Approval Number:    Date Approved/Denied:   PASRR Number: [id number]  Discharge Plan: SNF    Current Diagnoses: Patient Active Problem List   Diagnosis Date Noted  . Dysphagia, post-stroke   . Benign essential HTN   . Tobacco abuse   . Tachypnea   . Hyperglycemia   . Hypernatremia   . Leukocytosis   . Acute blood loss anemia   . Chronic kidney disease   . Pressure injury of skin 10/20/2016  . Cerebellar infarct ([place]) 10/19/2016  . ARF (acute renal failure) ([place]) 10/19/2016  . Essential hypertension 10/19/2016  . Rhabdomyolysis 10/19/2016  . Cerebellar stroke ([place]) 10/19/2016    Orientation RESPIRATION BLADDER Height & Weight     Self  Normal Incontinent, External catheter Weight: 168 lb 1.6 oz (76.2 kg) (Patient has air flow specialty bed) Height:  5\' 11"  (180.3 cm)  BEHAVIORAL SYMPTOMS/MOOD NEUROLOGICAL BOWEL NUTRITION STATUS      Incontinent Feeding tube (will have PEG placed prior to d/c)  AMBULATORY STATUS COMMUNICATION OF NEEDS Skin   Extensive Assist Does not communicate (incomprehensible speech at this time) PU Stage and Appropriate Care, Skin abrasions   PU Stage 2 Dressing:  (PRN)                   Personal Care Assistance Level of Assistance  Bathing, Dressing Bathing Assistance: Maximum assistance   Dressing Assistance: Maximum assistance     Functional Limitations Info  Speech     Speech Info:  Impaired (incomprehensible speech)    SPECIAL CARE FACTORS FREQUENCY  PT (By licensed PT), OT (By licensed OT)     PT Frequency: 5x/wk OT Frequency: 5x/wk            Contractures      Additional Factors Info  Code Status, Allergies Code Status Info: full Allergies Info: nka           Current Medications (10/26/2016):  This is the current hospital active medication list Current Facility-Administered Medications  Medication Dose Route Frequency Provider Last Rate Last Dose  . 0.45 % sodium chloride infusion   Intravenous Continuous Reyne Dumas, MD 50 mL/hr at 10/26/16 0718    . acetaminophen (TYLENOL) solution 650 mg  650 mg Per Tube Q4H PRN Rise Patience, MD   650 mg at 10/22/16 3903   Or  . acetaminophen (TYLENOL) suppository 650 mg  650 mg Rectal Q4H PRN Rise Patience, MD      . amoxicillin-clavulanate (AUGMENTIN) 600-42.9 MG/5ML suspension 875 mg  875 mg Per Tube BID Reyne Dumas, MD      . aspirin tablet 325 mg  325 mg Per Tube Daily Cherene Altes, MD   325 mg at 10/25/16 1025  . bethanechol (URECHOLINE) tablet 10 mg  10 mg Per Tube TID Cherene Altes, MD   10 mg at 10/25/16 2316  . chlorhexidine (PERIDEX) 0.12 % solution 15 mL  15 mL Mouth Rinse BID Cherene Altes, MD   15 mL at 10/25/16 1026  . clopidogrel (PLAVIX) tablet 75 mg  75 mg Per Tube Daily Cherene Altes, MD   75 mg at 10/25/16 1026  . feeding supplement (JEVITY 1.2 CAL) liquid 1,000 mL  1,000 mL Per Tube Continuous Cherene Altes, MD 75 mL/hr at 10/26/16 0411 1,000 mL at 10/26/16 0411  . free water 300 mL  300 mL Per Tube Q4H Cherene Altes, MD   300 mL at 10/26/16 0803  . hydrALAZINE (APRESOLINE) injection 10 mg  10 mg Intravenous Q4H PRN Rise Patience, MD      . MEDLINE mouth rinse  15 mL Mouth Rinse q12n4p Cherene Altes, MD   15 mL at 10/25/16 1600  . metoprolol tartrate (LOPRESSOR) 25 mg/10 mL oral suspension 25 mg  25 mg Per Tube BID Joette Catching T,  MD   25 mg at 10/25/16 2316  . nicotine (NICODERM CQ - dosed in mg/24 hours) patch 14 mg  14 mg Transdermal Daily Rise Patience, MD   14 mg at 10/25/16 1027  . pravastatin (PRAVACHOL) tablet 20 mg  20 mg Per Tube q1800 Cherene Altes, MD   20 mg at 10/25/16 1813  . thiamine (B-1) injection 100 mg  100 mg Intravenous Daily Rise Patience, MD   100 mg at 10/25/16 1025     Discharge Medications: Please see discharge summary for a list of discharge medications.  Relevant Imaging Results:  Relevant Lab Results:   Additional Information SS#: [id number]  Geralynn Ochs, LCSW

## 2016-10-26 NOTE — Progress Notes (Signed)
Contrast completed at 0243.

## 2016-10-26 NOTE — Progress Notes (Signed)
[facility] TEAM 1 - Stepdown/ICU TEAM  Scott Rivera  ZRA:[id number] DOB: 11/16/1950 DOA: 10/19/2016 PCP: Patient, No Pcp Per    Brief Narrative:  66 y.o. male with history of HTN who presented to [facility] on 10/15/2016 after family found him on the floor confused. MRI showed multiple acute infarcts involving both hemispheres of the cerebellum, the right occipital lobe. right midbrain, and left thalamus.  The fourth ventricle was patent/no hydrocephalus. TTE noted EF 55-60% w/ normal LV size. Carotid doppler showed right ICA 40-59% stenosis with left 60-79% stenosis. CXR noted infiltrates and patient was febrile therefore the pt was started on antibiotics. Creatinine at admission was 2.6, but this improved to 1.5.  CK of 7000 was noted at the time of admission. Patient's mentation slowly improved.  Family requested transfer to [facility], and this was accomplished on 7/5.  Subjective: Somnolent, minimally interactive, sister by the bedside   Assessment & Plan:  66 y.o.malewith history of HTN who presented to [facility] on 10/15/2016 after family found him on the floor confused. MRI showed multiple acute infarcts involving both hemispheres of the cerebellum, the right occipital lobe. right midbrain, and left thalamus. The fourth ventricle was patent/no hydrocephalus. TTE noted EF 55-60% w/ normal LV size. Carotid doppler showed right ICA 40-59% stenosis with left 60-79% stenosis. CXR noted infiltrates and patient was febrile therefore the pt was started on antibiotics. Creatinine at admission was 2.6, but this improved to 1.5. CK of 7000 was noted at the time of admission. Patient's mentation slowly improved. Family requested transfer to [facility], and this was accomplished on 7/5.  HOSPITAL COURSE:  Multiple Acute strokes B posterior cerebral hemispheres - severe dysarthria + L hemiparesis  Stroke team following- high grade proximal L vert artery stenosis  noted on CTA -2-D echo showed EF of 60-65%, LDL 24, hemoglobin A1c 5.4, continues to be high aspiration risk. Continue with parenteral feeding. Assess need for PEG placement. Neuro suggests "dual antiplatelet therapy for 3 months followed by Plavix alone" - will need extensive PT/OT and SLP -  . still NPO, SNF after PEG placement   Extensive left lung Aspiration pneumonia with possible mucous plugging, persistent leukocytosis, low-grade fever   completed cefepime after a seven-day course - nebs as needed - saturations greater than 90% without supplemental oxygen -repeat chest x-ray, persistent leukocytosis and low-grade fever concerning for left lower lobe pneumonia. Will start Augmentin for another 5 days    Dysphagia SLP following - Cortrak in place for tube feeds and medications - suspect patient will need long-term feeding tube   PEG indicated  IR consulted, CT abdomen pelvis completed  - will need PEG tube placement by IR    Hypernatremia , improving Continue - free water via feeding tube    HLD cont treatment via feeding tube  HTN BP meds being slowly titrated - currently stable  Rhabdomyolysis CK normalized  Acute renal failure Creatinine peaked at 2.6 - now 1.09    History of alcoholism Patient essentially beyond the window for even late alcohol withdrawal - mildy confused but no agitation noted today   Mild transaminits  Improving - likely simple shock liver in setting fo EtOH abuse - viral hep panel negative  Tobacco abuse-cessation advised  Severe protein calorie malnutrition in context of acute illness-continue Jevity    DVT prophylaxis: SCDs Code Status: FULL CODE Family Communication: spoke w/ sister at bedside  Disposition Plan: Pending  PEG tube placement  Consultants:  Neurology  Procedures: none  Antimicrobials:  Anti-infectives    Start     Dose/Rate Route Frequency Ordered Stop   10/25/16 1000  ceFEPIme (MAXIPIME) 1 g in  dextrose 5 % 50 mL IVPB     1 g 100 mL/hr over 30 Minutes Intravenous Every 12 hours 10/25/16 0922 10/25/16 2345   10/20/16 2200  ceFEPIme (MAXIPIME) 1 g in dextrose 5 % 50 mL IVPB  Status:  Discontinued     1 g 100 mL/hr over 30 Minutes Intravenous Every 12 hours 10/20/16 1047 10/25/16 0922   10/19/16 2300  ceFEPIme (MAXIPIME) 1 g in dextrose 5 % 50 mL IVPB  Status:  Discontinued     1 g 100 mL/hr over 30 Minutes Intravenous Every 8 hours 10/19/16 2124 10/20/16 1047   10/19/16 2200  vancomycin (VANCOCIN) IVPB 750 mg/150 ml premix  Status:  Discontinued     750 mg 150 mL/hr over 60 Minutes Intravenous Every 12 hours 10/19/16 2124 10/20/16 1131   10/19/16 2115  vancomycin (VANCOCIN) 1,500 mg in sodium chloride 0.9 % 500 mL IVPB  Status:  Discontinued     1,500 mg 250 mL/hr over 120 Minutes Intravenous  Once 10/19/16 2111 10/19/16 2117   10/19/16 2115  ceFEPIme (MAXIPIME) 2 g in dextrose 5 % 50 mL IVPB  Status:  Discontinued     2 g 100 mL/hr over 30 Minutes Intravenous  Once 10/19/16 2111 10/19/16 2120       Objective: Blood pressure (!) 159/88, pulse (!) 114, temperature 98.4 F (36.9 C), temperature source Axillary, resp. rate 20, height 5\' 11"  (1.803 m), weight 76.2 kg (168 lb 1.6 oz), SpO2 94 %.  Intake/Output Summary (Last 24 hours) at 10/26/16 0907 Last data filed at 10/26/16 0626  Gross per 24 hour  Intake               50 ml  Output             2900 ml  Net            -2850 ml   Filed Weights   10/23/16 0344 10/24/16 0456 10/26/16 0437  Weight: 75.4 kg (166 lb 3.6 oz) 77.3 kg (170 lb 6.4 oz) 76.2 kg (168 lb 1.6 oz)    Examination: General: No acute respiratory distress Lungs: mild L basilar crackles with good air movement throughout other fields with no wheeze Cardiovascular: RRR w/o M gallop or rub Abdomen: Nondistended, soft, bowel sounds positive, no rebound, no ascites, no appreciable mass Extremities: No edema bilateral lower extremities   CBC:  Recent  Labs Lab 10/19/16 2157 10/21/16 0253 10/22/16 0147 10/23/16 0330 10/25/16 0505  WBC 18.5* 17.2* 21.4* 20.9* 18.8*  NEUTROABS 14.9*  --   --   --   --   HGB 10.6* 10.0* 9.7* 9.5* 8.8*  HCT 32.2* 30.0* 30.0* 30.1* 27.0*  MCV 88.0 86.5 87.0 89.1 86.0  PLT 239 278 284 324 440   Basic Metabolic Panel:  Recent Labs Lab 10/19/16 2157 10/21/16 0253 10/22/16 0147 10/23/16 0330 10/25/16 0505  NA 138 148* 149* 151* 138  K 3.8 3.5 3.5 3.8 4.1  CL 109 119* 122* 119* 106  CO2 21* 22 21* 25 25  GLUCOSE 96 141* 161* 132* 154*  BUN 17 20 17 19 18   CREATININE 1.80* 1.53* 1.40* 1.26* 1.09  CALCIUM 9.2 9.1 9.3 9.3 8.8*   GFR: Estimated Creatinine Clearance: 71 mL/min (by C-G formula based on SCr of 1.09 mg/dL).  Liver Function Tests:  Recent Labs Lab 10/19/16 2157 10/21/16 0253 10/22/16 0147 10/23/16 0330 10/25/16 0505  AST 57* 106* 78* 46* 50*  ALT 47 67* 75* 59 55  ALKPHOS 77 144* 176* 151* 133*  BILITOT 1.8* 0.8 0.8 0.3 0.5  PROT 6.3* 6.7 6.1* 6.5 6.3*  ALBUMIN 2.1* 1.9* 1.8* 1.9* 1.8*    Cardiac Enzymes:  Recent Labs Lab 10/19/16 2157  CKTOTAL 199    Recent Results (from the past 240 hour(s))  MRSA PCR Screening     Status: None   Collection Time: 10/20/16  4:18 AM  Result Value Ref Range Status   MRSA by PCR NEGATIVE NEGATIVE Final    Comment:        The GeneXpert MRSA Assay (FDA approved for NASAL specimens only), is one component of a comprehensive MRSA colonization surveillance program. It is not intended to diagnose MRSA infection nor to guide or monitor treatment for MRSA infections.      Scheduled Meds: . aspirin  325 mg Per Tube Daily  . bethanechol  10 mg Per Tube TID  . chlorhexidine  15 mL Mouth Rinse BID  . clopidogrel  75 mg Per Tube Daily  . free water  300 mL Per Tube Q4H  . mouth rinse  15 mL Mouth Rinse q12n4p  . metoprolol tartrate  25 mg Per Tube BID  . nicotine  14 mg Transdermal Daily  . pravastatin  20 mg Per Tube q1800  .  thiamine injection  100 mg Intravenous Daily     LOS: 7 days      On-Call/Text Page:      [email or web address]      password [email or web address]  If 7PM-7AM, please contact night-coverage [email or web address] Password [email or web address] 10/26/2016, 9:07 AM

## 2016-10-26 NOTE — Progress Notes (Signed)
  Speech Language Pathology Treatment: Dysphagia  Patient Details Name: Scott Rivera MRN: [id number] DOB: [birth year] Today's Date: 10/26/2016 Time: 1500-1510 SLP Time Calculation (min) (ACUTE ONLY): 10 min  Assessment / Plan / Recommendation Clinical Impression  Pt was seen for skilled ST targeting dysphagia goals.  Pt is awaiting PEG placement but currently on hold for procedure due to Plavix per RN.  Tube feeds running at the time of today's therapy session and RN gave clearance for POs if pt was able to awaken and tolerate.  Pt somnolent upon arrival, would not open eyes and minimally verbal (one word utterances, dysphonic).  Respirations were audibly wet and pt had intermittent congested cough on his own secretions.  Oral care was completed to minimize risk of exacerbating current PNA from aspiration of oral bacteria via secretions.  Pt was accepting of suction toothette during oral care.  Despite keeping his eyes closed during the entirety of today's evaluation, pt did move his lips and tongue when they were rubbed with ice chips.  Increased moisture to mouth elicited swallow initiation of pt's saliva x3.  Pt's sister was present and provided with skilled education regarding realistic prognostic expectations for recovery and sequela of stroke.  She will need further education as part of future therapy sessions.  Pt was left in bed with bed alarm set and sister at bedside.     HPI HPI: 66 y.o. male with history of hypertension was brought to [facility] on 10/15/2016 after patient was found to be confused. As per the family who provided most of the history patient was not able to be raised and family went to check on him at home and was found on the floor confused. Patient was taken to the ER. The last contact was 2 days ago by patient's brother prior to the episode. At [facility] MRI showed multiple acute infarcts involving the both hemisphere of the  cerebellum and right occipital lobe right midbrain and left thalamus the fourth ventricle was patent no hydrocephalus. 2-D echo showing EF of 55-60% normal LV size. Carotid Doppler was done which showed right ICA 40-59% stenosis with left 60-79% stenosis. Chest x-ray was showing infiltrates and patient also was febrile for which patient was started on antibiotics. Patient's creatinine at admission was 2.6 which improved to 1.5 the time of discharge. Patient also had rhabdomyolysis with CK of 7000 at the time of admission. As per the note patient was initially agitated requiring Haldol at the other hospital. Patient's mentation slowly improved gradually and family requested transfer to [facility] after discussing with neurologist at [facility] Dr. Leonel Ramsay      SLP Plan  Continue with current plan of care       Recommendations  Diet recommendations: NPO                Oral Care Recommendations: Oral care QID Follow up Recommendations: Skilled Nursing facility SLP Visit Diagnosis: Dysphagia, oropharyngeal phase (R13.12) Plan: Continue with current plan of care       GO                PageSelinda Orion 10/26/2016, 3:15 PM

## 2016-10-27 ENCOUNTER — Inpatient Hospital Stay (HOSPITAL_COMMUNITY): Payer: Medicare HMO

## 2016-10-27 ENCOUNTER — Encounter (HOSPITAL_COMMUNITY): Payer: Self-pay | Admitting: Interventional Radiology

## 2016-10-27 HISTORY — PX: IR GASTROSTOMY TUBE MOD SED: IMG625

## 2016-10-27 LAB — COMPREHENSIVE METABOLIC PANEL
ALT: 84 U/L — ABNORMAL HIGH (ref 17–63)
AST: 63 U/L — ABNORMAL HIGH (ref 15–41)
Albumin: 1.9 g/dL — ABNORMAL LOW (ref 3.5–5.0)
Alkaline Phosphatase: 214 U/L — ABNORMAL HIGH (ref 38–126)
Anion gap: 9 (ref 5–15)
BUN: 21 mg/dL — ABNORMAL HIGH (ref 6–20)
CO2: 26 mmol/L (ref 22–32)
Calcium: 9.2 mg/dL (ref 8.9–10.3)
Chloride: 102 mmol/L (ref 101–111)
Creatinine, Ser: 1.08 mg/dL (ref 0.61–1.24)
GFR calc Af Amer: >60 mL/min (ref >60)
GFR calc non Af Amer: >60 mL/min (ref >60)
Glucose, Bld: 132 mg/dL — ABNORMAL HIGH (ref 65–99)
Potassium: 4.8 mmol/L (ref 3.5–5.1)
Sodium: 137 mmol/L (ref 135–145)
Total Bilirubin: 0.8 mg/dL (ref 0.3–1.2)
Total Protein: 7 g/dL (ref 6.5–8.1)

## 2016-10-27 LAB — GLUCOSE, CAPILLARY
Glucose-Capillary: 102 mg/dL — ABNORMAL HIGH (ref 65–99)
Glucose-Capillary: 134 mg/dL — ABNORMAL HIGH (ref 65–99)
Glucose-Capillary: 148 mg/dL — ABNORMAL HIGH (ref 65–99)
Glucose-Capillary: 157 mg/dL — ABNORMAL HIGH (ref 65–99)
Glucose-Capillary: 84 mg/dL (ref 65–99)
Glucose-Capillary: 85 mg/dL (ref 65–99)
Glucose-Capillary: 88 mg/dL (ref 65–99)

## 2016-10-27 LAB — CBC
HCT: 28.7 % — ABNORMAL LOW (ref 39.0–52.0)
Hemoglobin: 9.2 g/dL — ABNORMAL LOW (ref 13.0–17.0)
MCH: 28 pg (ref 26.0–34.0)
MCHC: 32.1 g/dL (ref 30.0–36.0)
MCV: 87.2 fL (ref 78.0–100.0)
Platelets: 513 10*3/uL — ABNORMAL HIGH (ref 150–400)
RBC: 3.29 MIL/uL — ABNORMAL LOW (ref 4.22–5.81)
RDW: 15.5 % (ref 11.5–15.5)
WBC: 19.8 10*3/uL — ABNORMAL HIGH (ref 4.0–10.5)

## 2016-10-27 MED ORDER — GLUCAGON HCL RDNA (DIAGNOSTIC) 1 MG IJ SOLR
INTRAMUSCULAR | Status: AC
  Filled 2016-10-27: qty 1

## 2016-10-27 MED ORDER — LIDOCAINE HCL (PF) 1 % IJ SOLN
INTRAMUSCULAR | Status: AC
  Filled 2016-10-27: qty 30

## 2016-10-27 MED ORDER — SODIUM CHLORIDE 0.9 % IV SOLN
INTRAVENOUS | Status: AC | PRN
  Administered 2016-10-27: 10 mL/h via INTRAVENOUS

## 2016-10-27 MED ORDER — MIDAZOLAM HCL 2 MG/2ML IJ SOLN
INTRAMUSCULAR | Status: AC | PRN
  Administered 2016-10-27: 1 mg via INTRAVENOUS

## 2016-10-27 MED ORDER — FENTANYL CITRATE (PF) 100 MCG/2ML IJ SOLN
INTRAMUSCULAR | Status: AC | PRN
  Administered 2016-10-27: 50 ug via INTRAVENOUS

## 2016-10-27 MED ORDER — BACITRACIN-NEOMYCIN-POLYMYXIN OINTMENT TUBE
TOPICAL_OINTMENT | Freq: Every day | CUTANEOUS | Status: AC
  Administered 2016-10-28 – 2016-10-29 (×3): via TOPICAL
  Administered 2016-10-30 – 2016-11-01 (×3): 1 via TOPICAL
  Administered 2016-11-02: 10:00:00 via TOPICAL
  Filled 2016-10-27 (×4): qty 14.17

## 2016-10-27 MED ORDER — MIDAZOLAM HCL 2 MG/2ML IJ SOLN
INTRAMUSCULAR | Status: AC
  Filled 2016-10-27: qty 4

## 2016-10-27 MED ORDER — IOPAMIDOL (ISOVUE-300) INJECTION 61%
INTRAVENOUS | Status: AC
  Administered 2016-10-27: 20 mL
  Filled 2016-10-27: qty 50

## 2016-10-27 MED ORDER — LIDOCAINE HCL 1 % IJ SOLN
INTRAMUSCULAR | Status: AC | PRN
  Administered 2016-10-27: 5 mL

## 2016-10-27 MED ORDER — FENTANYL CITRATE (PF) 100 MCG/2ML IJ SOLN
INTRAMUSCULAR | Status: AC
  Filled 2016-10-27: qty 4

## 2016-10-27 MED ORDER — GLUCAGON HCL RDNA (DIAGNOSTIC) 1 MG IJ SOLR
INTRAMUSCULAR | Status: AC | PRN
  Administered 2016-10-27: 1 mg via INTRAVENOUS
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
## 2016-10-27 NOTE — Progress Notes (Signed)
STROKE TEAM PROGRESS NOTE   HISTORY OF PRESENT ILLNESS (per record) Scott Rivera is an 66 y.o. male who presented to [facility] on July 1 with complaints of confusion per the family. Last confirmed well was two days prior by the patient's brother. Per H&P note, "MRI showed multiple acute infarcts involving the both hemisphere of the cerebellum and right occipital lobe right midbrain and left thalamus the fourth ventricle was patent no hydrocephalus". Imaging is not present for review at this time. No vascular imaging was performed at [facility]. Patient was transferred to [facility] by request of the family for a second opinion, Dr. Mike Craze accepted.  Scott Rivera is severely dysarthric and understanding him was very difficult. He does not endorse dizziness, double or blurry vision at this time. When asked what happened, he stated that he was sitting on the side of his bed when he "fell out" three days ago. Further questioning regarding the event elicited "I fell out" repeatedly. Unknown baseline cognitive status at this time. Family was not at bedside, and there are no contacts on file.  Date last known well: Date: 10/13/2016 Time last known well: Unable to determine tPA Given: No: Out of window  SUBJECTIVE (INTERVAL HISTORY) No family members present.  He is arousable today . He  Is awaiting PEG tube    OBJECTIVE Temp:  [97.8 F (36.6 C)-99.8 F (37.7 C)] 98.6 F (37 C) (07/13 1030) Pulse Rate:  [92-109] 92 (07/13 1030) Cardiac Rhythm: Sinus tachycardia (07/13 0700) Resp:  [18-20] 18 (07/13 1030) BP: (133-154)/(59-97) 133/66 (07/13 1030) SpO2:  [93 %-100 %] 100 % (07/13 1030) Weight:  [165 lb (74.8 kg)] 165 lb (74.8 kg) (07/13 0406)  CBC:   Recent Labs Lab 10/25/16 0505 10/27/16 0359  WBC 18.8* 19.8*  HGB 8.8* 9.2*  HCT 27.0* 28.7*  MCV 86.0 87.2  PLT 392 513*    Basic Metabolic Panel:   Recent Labs Lab 10/25/16 0505 10/27/16 0359  NA 138 137  K 4.1 4.8  CL  106 102  CO2 25 26  GLUCOSE 154* 132*  BUN 18 21*  CREATININE 1.09 1.08  CALCIUM 8.8* 9.2    Lipid Panel:     Component Value Date/Time   CHOL 92 10/21/2016 0253   TRIG 103 10/21/2016 0253   HDL 14 (L) 10/21/2016 0253   CHOLHDL 6.6 10/21/2016 0253   VLDL 21 10/21/2016 0253   LDLCALC 57 10/21/2016 0253   HgbA1c:  Lab Results  Component Value Date   HGBA1C 5.4 10/21/2016   Urine Drug Screen:     Component Value Date/Time   LABOPIA NONE DETECTED 10/19/2016 0050   COCAINSCRNUR NONE DETECTED 10/19/2016 0050   LABBENZ NONE DETECTED 10/19/2016 0050   AMPHETMU NONE DETECTED 10/19/2016 0050   THCU NONE DETECTED 10/19/2016 0050   LABBARB NONE DETECTED 10/19/2016 0050    Alcohol Level No results found for: ETH  IMAGING  Ct Angio Head W Or Wo Contrast Ct Angio Neck W Or Wo Contrast 10/20/2016 1. No acute arterial finding.  2. Advanced left and mild right V1 segment atheromatous stenosis. No visible ulcerated plaque.  3. Motion artifact from swallowing significantly degrades evaluation of the cervical carotids, especially on the left. No noted flow limiting stenosis.  4. Diminished number of left MCA branches in the setting of remote infarct.  5. No visible progression or hemorrhagic conversion of the acute posterior circulation infarcts.  6. Remote left MCA and ACA territory infarcts.  Dg Chest Port 1 View 10/19/2016 Increasing volume loss in the left hemithorax with progressive left lung opacities. Suspect aspiration/mucous plugging versus increasing pneumonia.    PHYSICAL EXAM Frail middle aged african [place] male in mild resp distress. . Afebrile. Head is nontraumatic. Neck is supple without bruit.    Cardiac exam no murmur or gallop. Lungs are clear to auscultation. Distal pulses are well felt.  Neurological Exam :  Sleepy but can be aroused. Severe dysarthria and  at times difficult to understand. Follows simple commands well. Extraocular movements are full range  without nystagmus. Blinks to threat more on the right than the left. Pupils irregular but reactive. Fundi were not visualized. Left lower facial weakness. Tongue midline. Weak cough and gag. Mild left hemiparesis 4/5. Left upper extremity drift. Left grip and intrinsic hand muscles are weak. Lower extremity strength testing is limited by knee pain and swelling bilaterally but is symmetric with  Diffuse weakness  2-3/5 bilaterally. Deep tendon reflexes are symmetric. Plantars downgoing. Sensation appears intact. Mild left finger-to-nose dysmetria. Gait was not tested.    ASSESSMENT/PLAN [name] is a 66 y.o. male with history of previous strokes, hypertension and ongoing tobacco use presenting with severe dysarthria. He did not receive IV t-PA due to late presentation.  Stroke:  Multiple acute infarcts in both posterior cerebral hemispheres - likely symptomatic from high-grade proximal left vertebral artery stenosis.  Resultant  dysarthria and left hemiparesis   CT head evolving bilateral cerebellar, right midbrain and left thalamic infarcts  MRI head - OSH - multiple acute infarcts in both hemispheres  MRA head -not performed  CTA H&N - Diminished number of left MCA branches - remote left MCA and ACA territory infarcts.Advanced left and mild right V1 segment atheromatous stenosis Carotid Doppler - CTA neck Noncalcified plaque causes severe narrowing of the proximal left vertebral artery. No evidence of dissection. No ulceration. Right vertebral artery is mildly narrowed at the V1segment due to atheromatous plaque.  2D Echo -Left ventricle: The cavity size was normal. Wall thickness was   increased in a pattern of mild LVH. Systolic function was normal.   The estimated ejection fraction was in the range of 60% to 65%.   Wall motion was normal; there were no regional wall motion    abnormalities.  LDL - 24  HgbA1c - 5.4  VTE prophylaxis - SCDs Diet NPO time specified Diet -  low sodium heart healthy  No antithrombotic prior to admission, now on aspirin 325 mg daily  Patient counseled to be compliant with his antithrombotic medications  Ongoing aggressive stroke risk factor management  Therapy recommendations: CIR recommended - rehabilitation M.D. consult has been ordered.  Disposition:  Pending  Hypertension  Stable  Permissive hypertension (OK if < 220/120) but gradually normalize in 5-7 days  Long-term BP goal normotensive  Hyperlipidemia  Home meds: Mevacor 20 mg daily not resumed in hospital  LDL 24, goal < 70  Pt NPO - resume statin when taking POs  Continue statin at discharge    Other Stroke Risk Factors  Advanced age  Cigarette smoker - advised to stop smoking  ETOH use, advised to drink no more than 1 drink per day.  Hx stroke/TIA   Other Active Problems  Aspiration pneumonia - Maxipime started 10/20/16 - leukocytosis  NPO - Jevity TF  At some point will need further workup for embolic source.  Elevated creatinine  Anemia  Hospital day # 8     Continue dual antiplatelet  therapy for 3 months followed by Plavix alone. Continue parenteral nutrition and medications as patient has severe dysarthria and dysphagia from his stroke.   PEG tube later today by IR team. Continue antibiotics for aspiration pneumonia.       Antony Contras, MD Medical Director [facility] Pager: [phone] 10/27/2016 2:12 PM   To contact Stroke Continuity provider, please refer to [email or web address] After hours, contact General Neurology

## 2016-10-27 NOTE — Sedation Documentation (Signed)
Patient is resting comfortably. 

## 2016-10-27 NOTE — Progress Notes (Signed)
Meds given via PEG tube. Flushed after per order. No complications.

## 2016-10-27 NOTE — Progress Notes (Signed)
[place] TEAM 1 - Stepdown/ICU TEAM  JAQUON GINGERICH  KGM:[id number] DOB: 02/13/1951 DOA: 10/19/2016 PCP: Patient, No Pcp Per    Brief Narrative:  66 y.o. male with history of HTN who presented to [facility] on 10/15/2016 after family found him on the floor confused. MRI showed multiple acute infarcts involving both hemispheres of the cerebellum, the right occipital lobe. right midbrain, and left thalamus.  The fourth ventricle was patent/no hydrocephalus. TTE noted EF 55-60% w/ normal LV size. Carotid doppler showed right ICA 40-59% stenosis with left 60-79% stenosis. CXR noted infiltrates and patient was febrile therefore the pt was started on antibiotics. Creatinine at admission was 2.6, but this improved to 1.5.  CK of 7000 was noted at the time of admission. Patient's mentation slowly improved.  Family requested transfer to [facility], and this was accomplished on 7/5.  Subjective: Somnolent, falls asleep during bedside conversation with sister  Assessment & Plan:  33 y.o.malewith history of HTN who presented to [facility] on 10/15/2016 after family found him on the floor confused. MRI showed multiple acute infarcts involving both hemispheres of the cerebellum, the right occipital lobe. right midbrain, and left thalamus. The fourth ventricle was patent/no hydrocephalus. TTE noted EF 55-60% w/ normal LV size. Carotid doppler showed right ICA 40-59% stenosis with left 60-79% stenosis. CXR noted infiltrates and patient was febrile therefore the pt was started on antibiotics. Creatinine at admission was 2.6, but this improved to 1.5. CK of 7000 was noted at the time of admission. Patient's mentation slowly improved. Family requested transfer to [facility], and this was accomplished on 7/5. Profound dysphagia noted, unable to pass SLP eval. Now for PEG tube placement on 7/13. 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 Continue with parenteral feeding via cortak .IR for  PEG placement 7/13 .Neuro and IR agreed to proceed with PEG with plavix on board. Neuro suggests "dual antiplatelet therapy for 3 months followed by Plavix alone" - will need extensive PT/OT and SLP -  SNF likely next week once PEG tube is ready to use   Extensive left lung Aspiration pneumonia with possible mucous plugging, persistent leukocytosis, low-grade fever   completed cefepime after a seven-day course - nebs as needed - saturations greater than 90% without supplemental oxygen -repeat chest x-ray, persistent leukocytosis and low-grade fever concerning for left lower lobe pneumonia. Continue Augmentin for another 5 days, until 7/17    Dysphagia SLP following - Cortrak in place for tube feeds and medications - suspect patient will need long-term feeding tube   PEG by  IR  today, CT abdomen pelvis completed       Hypernatremia , improving Continue - free water via feeding tube    HLD cont treatment via feeding tube  HTN BP meds being slowly titrated - currently stable  Rhabdomyolysis CK normalized  Acute renal failure Creatinine peaked at 2.6 - now 1.09    History of alcoholism Patient essentially beyond the window for even late alcohol withdrawal - mildy confused but no agitation noted today   Mild transaminits  Improving - likely simple shock liver in setting fo EtOH  abuse - viral hep panel negative  Tobacco abuse-cessation advised  Severe protein calorie malnutrition in context of acute illness-continue Jevity    DVT prophylaxis: SCDs Code Status: FULL CODE Family Communication: spoke w/ sister at bedside    Disposition Plan: Pending  PEG tube placement, DC to SNF when ready to use  Consultants:  Neurology IR  Procedures: none  Antimicrobials:  Anti-infectives    Start     Dose/Rate Route Frequency Ordered Stop   10/26/16 1000  amoxicillin-clavulanate (AUGMENTIN) 600-42.9 MG/5ML suspension 875 mg     875 mg Per Tube 2 times daily 10/26/16 0911 10/31/16 0759   10/25/16 1000  ceFEPIme (MAXIPIME) 1 g in dextrose 5 % 50 mL IVPB     1 g 100 mL/hr over 30 Minutes Intravenous Every 12 hours 10/25/16 0922 10/25/16 2345   10/20/16 2200  ceFEPIme (MAXIPIME) 1 g in dextrose 5 % 50 mL IVPB  Status:  Discontinued     1 g 100 mL/hr over 30 Minutes Intravenous Every 12 hours 10/20/16 1047 10/25/16 0922   10/19/16 2300  ceFEPIme (MAXIPIME) 1 g in dextrose 5 % 50 mL IVPB  Status:  Discontinued     1 g 100 mL/hr over 30 Minutes Intravenous Every 8 hours 10/19/16 2124 10/20/16 1047   10/19/16 2200  vancomycin (VANCOCIN) IVPB 750 mg/150 ml premix  Status:  Discontinued     750 mg 150 mL/hr over 60 Minutes Intravenous Every 12 hours 10/19/16 2124 10/20/16 1131   10/19/16 2115  vancomycin (VANCOCIN) 1,500 mg in sodium chloride 0.9 % 500 mL IVPB  Status:  Discontinued     1,500 mg 250 mL/hr over 120 Minutes Intravenous  Once 10/19/16 2111 10/19/16 2117   10/19/16 2115  ceFEPIme (MAXIPIME) 2 g in dextrose 5 % 50 mL IVPB  Status:  Discontinued     2 g 100 mL/hr over 30 Minutes Intravenous  Once 10/19/16 2111 10/19/16 2120       Objective: Blood pressure (!) 153/97, pulse 100, temperature 98.5 F (36.9 C), temperature source Axillary, resp. rate 18, height 5\' 11"  (1.803 m), weight 74.8 kg (165 lb), SpO2 94 %.  Intake/Output Summary (Last 24 hours) at 10/27/16 1103 Last data filed at 10/27/16 0507  Gross per 24 hour  Intake             1510 ml  Output             1100 ml  Net              410 ml   Filed Weights   10/24/16 0456 10/26/16 0437 10/27/16 0406  Weight: 77.3 kg (170 lb 6.4 oz) 76.2 kg  (168 lb 1.6 oz) 74.8 kg (165 lb)    Examination: General: No acute respiratory distress Lungs: mild L basilar crackles with good air movement throughout other fields with no wheeze Cardiovascular: RRR w/o M gallop or rub Abdomen: Nondistended, soft, bowel sounds positive, no rebound, no ascites, no appreciable mass Extremities: No edema bilateral lower extremities   CBC:  Recent Labs Lab 10/21/16 0253 10/22/16 0147 10/23/16 0330 10/25/16 0505 10/27/16 0359  WBC 17.2* 21.4* 20.9* 18.8* 19.8*  HGB 10.0* 9.7* 9.5* 8.8* 9.2*  HCT 30.0* 30.0* 30.1* 27.0* 28.7*  MCV 86.5 87.0 89.1 86.0 87.2  PLT 278 284 324 392 962*   Basic Metabolic Panel:  Recent Labs Lab 10/21/16 0253 10/22/16 0147 10/23/16 0330 10/25/16 0505 10/27/16 0359  NA 148* 149* 151* 138 137  K 3.5  3.5 3.8 4.1 4.8  CL 119* 122* 119* 106 102  CO2 22 21* 25 25 26   GLUCOSE 141* 161* 132* 154* 132*  BUN 20 17 19 18  21*  CREATININE 1.53* 1.40* 1.26* 1.09 1.08  CALCIUM 9.1 9.3 9.3 8.8* 9.2   GFR: Estimated Creatinine Clearance: 71.2 mL/min (by C-G formula based on SCr of 1.08 mg/dL).  Liver Function Tests:  Recent Labs Lab 10/21/16 0253 10/22/16 0147 10/23/16 0330 10/25/16 0505 10/27/16 0359  AST 106* 78* 46* 50* 63*  ALT 67* 75* 59 55 84*  ALKPHOS 144* 176* 151* 133* 214*  BILITOT 0.8 0.8 0.3 0.5 0.8  PROT 6.7 6.1* 6.5 6.3* 7.0  ALBUMIN 1.9* 1.8* 1.9* 1.8* 1.9*    Cardiac Enzymes: No results for input(s): CKTOTAL, CKMB, CKMBINDEX, TROPONINI in the last 168 hours.  Recent Results (from the past 240 hour(s))  MRSA PCR Screening     Status: None   Collection Time: 10/20/16  4:18 AM  Result Value Ref Range Status   MRSA by PCR NEGATIVE NEGATIVE Final    Comment:        The GeneXpert MRSA Assay (FDA approved for NASAL specimens only), is one component of a comprehensive MRSA colonization surveillance program. It is not intended to diagnose MRSA infection nor to guide or monitor treatment  for MRSA infections.      Scheduled Meds: . amoxicillin-clavulanate  875 mg Per Tube BID  . aspirin  325 mg Per Tube Daily  . bethanechol  10 mg Per Tube TID  . chlorhexidine  15 mL Mouth Rinse BID  . clopidogrel  75 mg Per Tube Daily  . free water  300 mL Per Tube Q4H  . mouth rinse  15 mL Mouth Rinse q12n4p  . metoprolol tartrate  25 mg Per Tube BID  . nicotine  14 mg Transdermal Daily  . pravastatin  20 mg Per Tube q1800  . thiamine injection  100 mg Intravenous Daily     LOS: 8 days      On-Call/Text Page:      [email or web address]      password [email or web address]  If 7PM-7AM, please contact night-coverage [email or web address] Password [email or web address] 10/27/2016, 11:03 AM

## 2016-10-27 NOTE — Progress Notes (Signed)
Spoke to nurse after missing pt earlier. Nurse did not believe pt was when he went down to radiology, or would be when he came back, competent to complete advanced directive today. Chaplain available for f/u.  Gerrit Heck, Chaplain

## 2016-10-27 NOTE — Sedation Documentation (Signed)
Patient denies pain and is resting comfortably.  

## 2016-10-27 NOTE — Care Management Important Message (Signed)
Important Message  Patient Details  Name: Scott Rivera MRN: [id number] Date of Birth: 10/04/1950   Medicare Important Message Given:  Yes    Shauntay Brunelli Montine Circle 10/27/2016, 1:52 PM

## 2016-10-27 NOTE — Progress Notes (Signed)
CSW confirmed with [facility] that the facility would be able to offer a bed for the patient. CSW informed patient's sister, [name], at bedside. CSW faxed clinical information to [facility] to initiate insurance authorization. CSW contacted [employer] case manager, Alyse Low, and left voicemail to check on status of authorization.   CSW will follow to obtain insurance approval and transition to SNF when medically ready.  Laveda Abbe, [place] Clinical Social Worker [phone]

## 2016-10-27 NOTE — Progress Notes (Signed)
Patient discharged with foley, per Dr. Maryland Pink order.

## 2016-10-27 NOTE — Evaluation (Signed)
Speech Language Pathology Evaluation Patient Details Name: Scott Rivera MRN: [id number] DOB: Mar 31, 1951 Today's Date: 10/27/2016 Time: 1005-1015 SLP Time Calculation (min) (ACUTE ONLY): 10 min  Problem List:  Patient Active Problem List   Diagnosis Date Noted  . Dysphagia, post-stroke   . Benign essential HTN   . Tobacco abuse   . Tachypnea   . Hyperglycemia   . Hypernatremia   . Leukocytosis   . Acute blood loss anemia   . Chronic kidney disease   . Pressure injury of skin 10/20/2016  . Cerebellar infarct ([place]) 10/19/2016  . ARF (acute renal failure) ([place]) 10/19/2016  . Essential hypertension 10/19/2016  . Rhabdomyolysis 10/19/2016  . Cerebellar stroke ([place]) 10/19/2016   Past Medical History:  Past Medical History:  Diagnosis Date  . Hypertension   . Stroke [facility])    Past Surgical History:  Past Surgical History:  Procedure Laterality Date  . KNEE SURGERY     HPI:  66 y.o. male with history of hypertension was brought to [facility] on 10/15/2016 after patient was found to be confused. As per the family who provided most of the history patient was not able to be raised and family went to check on him at home and was found on the floor confused. Patient was taken to the ER. The last contact was 2 days ago by patient's brother prior to the episode. At [facility] MRI showed multiple acute infarcts involving the both hemisphere of the cerebellum and right occipital lobe right midbrain and left thalamus the fourth ventricle was patent no hydrocephalus. 2-D echo showing EF of 55-60% normal LV size. Carotid Doppler was done which showed right ICA 40-59% stenosis with left 60-79% stenosis. Chest x-ray was showing infiltrates and patient also was febrile for which patient was started on antibiotics. Patient's creatinine at admission was 2.6 which improved to 1.5 the time of discharge. Patient also had rhabdomyolysis with CK of 7000 at the  time of admission. As per the note patient was initially agitated requiring Haldol at the other hospital. Patient's mentation slowly improved gradually and family requested transfer to [facility] after discussing with neurologist at [facility] Dr. Leonel Ramsay   Assessment / Plan / Recommendation Clinical Impression  Patient presents with severe cognitive communication impairment as well as deficits in expressive and receptive language. Suspect premorbid deficits however no family present to determine baseline. Pt kept his eyes closed for the entirety of the evaluation, though he appeared alert and responded to approximately 75% of basic Y/N and simple biographical questions, though Y/N is unreliable and approximately 25% of responses inappropriate. Pt follows limited basic commands, insufficient for thorough oral motor examination. Verbal responses limited to single word only; speech is dysarthric. Sustained attention impaired; pt requires consistent verbal cues for attention to basic questions. Recommend skilled ST to address cognitive-linguistic deficits in order to maximize cognitive and communicative function, reduce caregiver burden and improve quality of life. SLP will continue to follow for dysphagia, cognitive linguistic treatment.    SLP Assessment  SLP Recommendation/Assessment: Patient needs continued Speech Lanaguage Pathology Services SLP Visit Diagnosis: Cognitive communication deficit (R41.841);Aphasia (R47.01)    Follow Up Recommendations  Skilled Nursing facility    Frequency and Duration min 2x/week  2 weeks      SLP Evaluation Cognition  Overall Cognitive Status: Impaired/Different from baseline Arousal/Alertness: Lethargic Orientation Level: Oriented to person;Disoriented to place;Disoriented to time;Disoriented to situation Attention: Sustained Sustained Attention: Impaired Sustained Attention Impairment:  Verbal basic;Functional basic Memory:  Impaired Awareness: Impaired Awareness Impairment: Intellectual impairment       Comprehension  Auditory Comprehension Overall Auditory Comprehension: Impaired Yes/No Questions: Impaired Basic Biographical Questions: 51-75% accurate Basic Immediate Environment Questions: 50-74% accurate Conversation: Other (comment) (single word responses) Interfering Components: Attention EffectiveTechniques: Extra processing time;Repetition Visual Recognition/Discrimination Discrimination: Not tested Reading Comprehension Reading Status: Not tested    Expression Expression Primary Mode of Expression: Verbal Verbal Expression Overall Verbal Expression: Impaired Initiation: Impaired Level of Generative/Spontaneous Verbalization: Word Repetition: Impaired Level of Impairment: Word level Naming: Not tested Pragmatics: Impairment Impairments: Eye contact Interfering Components: Attention Effective Techniques: Other (Comment) (y/n questions) Non-Verbal Means of Communication: Not applicable Written Expression Dominant Hand: Right Written Expression: Not tested   Oral / Motor  Oral Motor/Sensory Function Overall Oral Motor/Sensory Function: Generalized oral weakness Motor Speech Overall Motor Speech: Impaired Respiration: Impaired Level of Impairment: Word Phonation: Wet;Low vocal intensity Articulation: Impaired Level of Impairment: Word Intelligibility: Intelligibility reduced Word: 50-74% accurate Phrase: Not tested Sentence: Not tested Conversation: Not tested   GO                    Aliene Altes 10/27/2016, 10:39 AM  Deneise Lever, [place], [place] Pathologist [phone]

## 2016-10-27 NOTE — Progress Notes (Signed)
Discussed possible procedure with Dr. Barbie Banner.  Patient with low grade fevers and ongoing antibiotic therapy for aspiration pneumonia. Transitioned to Augmentin yesterday.  OK for possible gastrostomy placement today.  Spoke with RN, patient has not received TF since 7AM.  Will proceed with gastrostomy placement this afternoon as scheduled allows.   Brynda Greathouse, MMS RDN PA-C 10:22 AM

## 2016-10-27 NOTE — Procedures (Signed)
20 Fr pull through G tube EBL 0 Comp 0 

## 2016-10-27 NOTE — Progress Notes (Signed)
SLP Cancellation Note  Patient Details Name: Scott Rivera MRN: [id number] DOB: [birth year]   Cancelled treatment:       Reason Eval/Treat Not Completed: Medical issues which prohibited therapy. Dysphagia treatment/PO trials withheld at request of RN as PEG placement pending. Please see note for cognitive linguistic evaluation completed this morning.  Deneise Lever, [place], CCC-SLP Speech-Language Pathologist [phone]   Scott Rivera 10/27/2016, 10:15 AM

## 2016-10-27 NOTE — Care Management Note (Signed)
Case Management Note  Patient Details  Name: Scott Rivera MRN: [id number] Date of Birth: 1951/01/14  Subjective/Objective:                    Action/Plan: Awaiting PEG placement for patient to be able to d/c to SNF. CM following.   Expected Discharge Date:  10/25/16               Expected Discharge Plan:  Skilled Nursing Facility  In-House Referral:  Clinical Social Work  Discharge planning Services  CM Consult  Post Acute Care Choice:  IP Rehab Choice offered to:     DME Arranged:    DME Agency:     HH Arranged:    [place] Agency:     Status of Service:  In process, will continue to follow  If discussed at Long Length of Stay Meetings, dates discussed:    Additional Comments:  Pollie Friar, RN 10/27/2016, 12:11 PM

## 2016-10-27 NOTE — Progress Notes (Signed)
Patient has had no tube feeding since 0700 this AM. Patient had meds via feeding tube at 0904. IR aware. Will continue to hold tube feeds until notified by IR

## 2016-10-27 NOTE — Progress Notes (Signed)
Responded to call to spiritual care office to create advanced directive. However, pt not in rm. Will stop back by another time.   10/27/16 1500  Clinical Encounter Type  Visited With Patient not available  Visit Type Initial;Psychological support;Spiritual support;Social support  Referral From Nurse   Gerrit Heck, Chaplain

## 2016-10-28 ENCOUNTER — Inpatient Hospital Stay (HOSPITAL_COMMUNITY): Payer: Medicare HMO

## 2016-10-28 DIAGNOSIS — I6340 Cerebral infarction due to embolism of unspecified cerebral artery: Secondary | ICD-10-CM

## 2016-10-28 LAB — GLUCOSE, CAPILLARY
Glucose-Capillary: 76 mg/dL (ref 65–99)
Glucose-Capillary: 99 mg/dL (ref 65–99)
Glucose-Capillary: 107 mg/dL — ABNORMAL HIGH (ref 65–99)
Glucose-Capillary: 118 mg/dL — ABNORMAL HIGH (ref 65–99)
Glucose-Capillary: 97 mg/dL (ref 65–99)

## 2016-10-28 MED ORDER — FREE WATER
200.0000 mL | Status: DC
  Administered 2016-10-28 (×2): 200 mL

## 2016-10-28 MED ORDER — PRO-STAT SUGAR FREE PO LIQD
30.0000 mL | Freq: Two times a day (BID) | ORAL | Status: DC
  Administered 2016-10-28 – 2016-10-30 (×3): 30 mL
  Filled 2016-10-28 (×6): qty 30

## 2016-10-28 MED ORDER — ADULT MULTIVITAMIN LIQUID CH
15.0000 mL | Freq: Every day | ORAL | Status: DC
  Administered 2016-10-28 – 2016-11-06 (×9): 15 mL
  Filled 2016-10-28 (×11): qty 15

## 2016-10-28 MED ORDER — JEVITY 1.2 CAL PO LIQD
1000.0000 mL | ORAL | Status: DC
  Administered 2016-10-28: 1000 mL
  Filled 2016-10-28 (×6): qty 1000

## 2016-10-28 NOTE — Progress Notes (Signed)
Patient uncooperative with me when trying to do NIHSS and neuro assessments.  He did tell me to "get out".

## 2016-10-28 NOTE — Progress Notes (Signed)
PROGRESS NOTE    Scott Rivera  JKK:[id number] DOB: [birth year] DOA: 10/19/2016 PCP: Patient, No Pcp Per   Brief Narrative:  66 y.o.malewith history of HTN who presented to [facility] on 10/15/2016 after family found him on the floor confused. MRI showed multiple acute infarcts involving both hemispheres of the cerebellum, the right occipital lobe. right midbrain, and left thalamus.  The fourth ventricle was patent/no hydrocephalus. TTE noted EF 55-60% w/ normal LV size. Carotid doppler showed right ICA 40-59% stenosis with left 60-79% stenosis. CXR noted infiltrates and patient was febrile therefore the pt was started on antibiotics. Creatinine at admission was 2.6, but this improved to 1.08.  CK of 7000 was noted at the time of admission. Patient's mentation slowly improved.  Family requested transfer to [facility], and this was accomplished on 7/5.   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 Continue with parenteral feeding via cortak .IR for  PEG placement 7/13 .Neuro and IR agreed to proceed with PEG with plavix on board.Neuro suggests "dual antiplatelet therapy for 3 months followed by Plavix alone" - will need extensive PT/OT and SLP -  SNF likely next week once PEG tube is ready to use   Extensive left lung Aspiration pneumonia with possible mucous  plugging, persistent leukocytosis, low-grade fever completed cefepimeafter a seven-day course - nebs as needed - saturations greater than 90% without supplemental oxygen -repeat chest x-ray, persistent leukocytosis and low-grade fever concerning for left lower lobe pneumonia. Continue Augmentin for another 5 days, until 7/17    Dysphagia SLP following - Cortrak in place for tube feeds and medications - suspect patient will need long-term feeding tube   PEG by  IR  today, CT abdomen pelvis completed       Hypernatremia, improving Continue- free water via feeding tube   HLD cont treatment via feeding tube  HTN BP meds being slowly titrated - currently stable  Rhabdomyolysis CK normalized  Acute renal failure Creatinine peaked at 2.6 - now 1.09   History of alcoholism Patient essentially beyond the window for even late alcohol withdrawal - mildy confused but no agitation noted today   Mild transaminits  Improving - likely simple shock liver in setting fo EtOH abuse - viral hep panel negative  Tobacco abuse-cessation advised  Severe protein calorie malnutritionin context of acute illness-continue Jevity    DVT prophylaxis: SCDs Code Status: FULL CODE Family Communication: spoke w/ sister at bedside  Disposition Plan: Pending  PEG tube placement, DC to SNF when ready to use  Consultants:  Neurology IR  Procedures: none  Antimicrobials:            Anti-infectives    Start     Dose/Rate Route Frequency Ordered Stop   10/26/16 1000  amoxicillin-clavulanate (AUGMENTIN) 600-42.9 MG/5ML suspension 875 mg     875 mg Per  Tube 2 times daily 10/26/16 0911 10/31/16 0759   10/25/16 1000  ceFEPIme (MAXIPIME) 1 g in dextrose 5 % 50 mL IVPB     1 g 100 mL/hr over 30 Minutes Intravenous Every 12 hours 10/25/16 0922 10/25/16 2345   10/20/16 2200  ceFEPIme (MAXIPIME) 1 g in dextrose 5 % 50 mL IVPB  Status:  Discontinued     1 g 100 mL/hr over 30  Minutes Intravenous Every 12 hours 10/20/16 1047 10/25/16 0922   10/19/16 2300  ceFEPIme (MAXIPIME) 1 g in dextrose 5 % 50 mL IVPB  Status:  Discontinued     1 g 100 mL/hr over 30 Minutes Intravenous Every 8 hours 10/19/16 2124 10/20/16 1047   10/19/16 2200  vancomycin (VANCOCIN) IVPB 750 mg/150 ml premix  Status:  Discontinued     750 mg 150 mL/hr over 60 Minutes Intravenous Every 12 hours 10/19/16 2124 10/20/16 1131   10/19/16 2115  vancomycin (VANCOCIN) 1,500 mg in sodium chloride 0.9 % 500 mL IVPB  Status:  Discontinued     1,500 mg 250 mL/hr over 120 Minutes Intravenous  Once 10/19/16 2111 10/19/16 2117   10/19/16 2115  ceFEPIme (MAXIPIME) 2 g in dextrose 5 % 50 mL IVPB  Status:  Discontinued     2 g 100 mL/hr over 30 Minutes Intravenous  Once 10/19/16 2111 10/19/16 2120         Subjective: Patient with no complaints. Sleeping. Tolerating tube feeds well. At goal tube feed dosing.  Objective: Vitals:   10/28/16 0203 10/28/16 0504 10/28/16 0917 10/28/16 1324  BP: 130/65 107/78 111/70 118/66  Pulse: (!) 115 (!) 105 98 89  Resp: 20 20 20 20   Temp: 99.5 F (37.5 C) 98.5 F (36.9 C) 98 F (36.7 C) 97.7 F (36.5 C)  TempSrc: Oral Oral Oral Axillary  SpO2: 99% 99% 99% 90%  Weight:  77.3 kg (170 lb 8 oz)    Height:        Intake/Output Summary (Last 24 hours) at 10/28/16 1412 Last data filed at 10/28/16 1100  Gross per 24 hour  Intake              260 ml  Output             3000 ml  Net            -2740 ml   Filed Weights   10/26/16 0437 10/27/16 0406 10/28/16 0504  Weight: 76.2 kg (168 lb 1.6 oz) 74.8 kg (165 lb) 77.3 kg (170 lb 8 oz)    Examination:  General: No acute respiratory distress Lungs: mild L basilar crackles with good air movement throughout other fields with no wheeze Cardiovascular: RRR w/o M gallop or rub Abdomen: Nondistended, soft, bowel sounds positive, no rebound, no ascites, no appreciable mass Extremities: No edema bilateral lower  extremities      Data Reviewed: I have personally reviewed following labs and imaging studies  CBC:  Recent Labs Lab 10/22/16 0147 10/23/16 0330 10/25/16 0505 10/27/16 0359  WBC 21.4* 20.9* 18.8* 19.8*  HGB 9.7* 9.5* 8.8* 9.2*  HCT 30.0* 30.1* 27.0* 28.7*  MCV 87.0 89.1 86.0 87.2  PLT 284 324 392 272*   Basic Metabolic Panel:  Recent Labs Lab 10/22/16 0147 10/23/16 0330 10/25/16 0505 10/27/16 0359  NA 149* 151* 138 137  K 3.5 3.8 4.1 4.8  CL 122* 119* 106 102  CO2 21* 25 25 26   GLUCOSE 161* 132* 154* 132*  BUN 17 19  18 21*  CREATININE 1.40* 1.26* 1.09 1.08  CALCIUM 9.3 9.3 8.8* 9.2   GFR: Estimated Creatinine Clearance: 71.7 mL/min (by C-G formula based on SCr of 1.08 mg/dL). Liver Function Tests:  Recent Labs Lab 10/22/16 0147 10/23/16 0330 10/25/16 0505 10/27/16 0359  AST 78* 46* 50* 63*  ALT 75* 59 55 84*  ALKPHOS 176* 151* 133* 214*  BILITOT 0.8 0.3 0.5 0.8  PROT 6.1* 6.5 6.3* 7.0  ALBUMIN 1.8* 1.9* 1.8* 1.9*   No results for input(s): LIPASE, AMYLASE in the last 168 hours. No results for input(s): AMMONIA in the last 168 hours. Coagulation Profile: No results for input(s): INR, PROTIME in the last 168 hours. Cardiac Enzymes: No results for input(s): CKTOTAL, CKMB, CKMBINDEX, TROPONINI in the last 168 hours. BNP (last 3 results) No results for input(s): PROBNP in the last 8760 hours. HbA1C: No results for input(s): HGBA1C in the last 72 hours. CBG:  Recent Labs Lab 10/27/16 2011 10/28/16 0000 10/28/16 0436 10/28/16 0736 10/28/16 1113  GLUCAP 85 84 76 99 107*   Lipid Profile: No results for input(s): CHOL, HDL, LDLCALC, TRIG, CHOLHDL, LDLDIRECT in the last 72 hours. Thyroid Function Tests: No results for input(s): TSH, T4TOTAL, FREET4, T3FREE, THYROIDAB in the last 72 hours. Anemia Panel: No results for input(s): VITAMINB12, FOLATE, FERRITIN, TIBC, IRON, RETICCTPCT in the last 72 hours. Sepsis Labs: No results for input(s):  PROCALCITON, LATICACIDVEN in the last 168 hours.  Recent Results (from the past 240 hour(s))  MRSA PCR Screening     Status: None   Collection Time: 10/20/16  4:18 AM  Result Value Ref Range Status   MRSA by PCR NEGATIVE NEGATIVE Final    Comment:        The GeneXpert MRSA Assay (FDA approved for NASAL specimens only), is one component of a comprehensive MRSA colonization surveillance program. It is not intended to diagnose MRSA infection nor to guide or monitor treatment for MRSA infections.          Radiology Studies: Ir Gastrostomy Tube Mod Sed  Result Date: 10/27/2016 INDICATION: Stroke EXAM: PERC PLACEMENT GASTROSTOMY MEDICATIONS: Ancef 2 g; Antibiotics were administered within 1 hour of the procedure. Glucagon 1 mg IV ANESTHESIA/SEDATION: Versed 1 mg IV; Fentanyl 50 mcg IV Moderate Sedation Time:  17 The patient was continuously monitored during the procedure by the interventional radiology nurse under my direct supervision. CONTRAST:  20 cc Isovue-300 - administered into the gastric lumen. FLUOROSCOPY TIME:  Fluoroscopy Time: 3 minutes 42 seconds (13 mGy). COMPLICATIONS: None immediate. PROCEDURE: The procedure, risks, benefits, and alternatives were explained to the patient. Questions regarding the procedure were encouraged and answered. The patient understands and consents to the procedure. The epigastrium was prepped with Betadine in a sterile fashion, and a sterile drape was applied covering the operative field. A sterile gown and sterile gloves were used for the procedure. A 5-French orogastric tube is placed under fluoroscopic guidance. Scout imaging of the abdomen confirms barium within the transverse colon. The stomach was distended with gas. Under fluoroscopic guidance, an 18 gauge needle was utilized to puncture the anterior wall of the body of the stomach. An Amplatz wire was advanced through the needle passing a T fastener into the lumen of the stomach. The T fastener  was secured for gastropexy. A 9-French sheath was inserted. A snare was advanced through the 9-French sheath. A Britta Mccreedy was advanced through the orogastric tube. It was snared then pulled out the oral cavity, pulling the snare, as well.  The leading edge of the gastrostomy was attached to the snare. It was then pulled down the esophagus and out the percutaneous site. It was secured in place. Contrast was injected. The image demonstrates placement of a 20-French pull-through type gastrostomy tube into the body of the stomach. IMPRESSION: Successful 20 French pull-through gastrostomy. Electronically Signed   By: Marybelle Killings M.D.   On: 10/27/2016 16:55        Scheduled Meds: . amoxicillin-clavulanate  875 mg Per Tube BID  . aspirin  325 mg Per Tube Daily  . bethanechol  10 mg Per Tube TID  . chlorhexidine  15 mL Mouth Rinse BID  . clopidogrel  75 mg Per Tube Daily  . feeding supplement (PRO-STAT SUGAR FREE 64)  30 mL Per Tube BID  . free water  200 mL Per Tube Q4H  . mouth rinse  15 mL Mouth Rinse q12n4p  . metoprolol tartrate  25 mg Per Tube BID  . multivitamin  15 mL Per Tube Daily  . neomycin-bacitracin-polymyxin   Topical Daily  . nicotine  14 mg Transdermal Daily  . pravastatin  20 mg Per Tube q1800  . thiamine injection  100 mg Intravenous Daily   Continuous Infusions: . sodium chloride 50 mL/hr at 10/26/16 0718  . feeding supplement (JEVITY 1.2 CAL) 1,000 mL (10/28/16 1100)     LOS: 9 days    Time spent: 25 minutes.    Lady Deutscher, MD, FACP [facility] Pager [phone]  If 7PM-7AM, please contact night-coverage [email or web address] Password [email or web address] 10/28/2016, 2:12 PM

## 2016-10-28 NOTE — Progress Notes (Signed)
Received call from Central monitor approximately 2110, stating patient had 17 beat run of vtach, changes in QRS, change in lead 3 and AVR.I review strip and did not see this, however will page MD  [email or web address], [facility] just called to say at 20:46 pt had 17 beat run of vtach. I didn't read it that way.VS WNL, Xcept ST at 110. Thank you.  Will await call back.

## 2016-10-28 NOTE — Progress Notes (Signed)
Attempted to do NIHSS.  Patient follows some commands, but it is difficult to accurately assess him.  He did say his L leg hurt. Will give analgesic for that.

## 2016-10-28 NOTE — Progress Notes (Signed)
Nutrition Follow Up  DOCUMENTATION CODES:   Severe malnutrition in context of acute illness/injury  INTERVENTION:    Decrease Jevity 1.2 via PEG at goal rate of 65 ml/h (1560 ml per day)  Add Prostat 54m BID via PEG, each supplement provides 100 kcal, 15 grams protein.  Decrease free water flushes to 204mq4 hrs   Provides 2072kcal/day, 116g/day protein, 28g/day fiber, [id number] free water daily  MVI via PEG  NUTRITION DIAGNOSIS:   Malnutrition (severe) related to acute illness as evidenced by severe depletion of body fat, severe depletion of muscle mass, energy intake < or equal to 50% for > or equal to 5 days, ongoing  GOAL:   Patient will meet greater than or equal to 90% of their needs, met  MONITOR:   Labs, Weight trends, TF tolerance, Skin, I & O's  ASSESSMENT:   66 97 male with PMH of HTN, stroke who was admitted to [facility] 7/1 after family found him on the floor confused. MRI showed multiple acute infarcts. He was transferred to [facility] 7/5 at family's request.   Pt s/p PEG placement 7/13. Continues to tolerate tube feeds. Per chart, no BM since 7/5. Spoke to RN who reports that pt is bed bound and unable to use toilet without assistance. RN unsure of pt's last BM but reports that he is incontenent at times. Pt getting 32g/day of fiber in tube feeds. RD will adjust tube feeds to reduce fiber intake. Bowel regimen as needed per MD discretion. Continue NPO per SLP recommendations. Per chart, pt with weight gain since admit. RD will decrease free water flushes and continue to monitor weights. RD will add MVI to assist with wound healing.     Medications reviewed and include: augmentin, aspirin, plavix, nicotine, thiamine  Labs reviewed: BUN 21(H), alk phos 214(H), alb 1.9(L), AST 63(H), ALT 84(H) WBC- 19.8(H), Hgb 9.2(L), Hct 28.7(L)  Diet Order:  NPO  Skin:  Wound (see comment) (stage II to R buttocks, L shoulder; DTI to heels)  Last BM:  7/5  Height:    Ht Readings from Last 1 Encounters:  10/19/16 5' 11"  (1.803 m)   Weight: >>> stable  Wt Readings from Last 1 Encounters:  10/28/16 170 lb 8 oz (77.3 kg)   Ideal Body Weight:  78.2 kg  BMI:  Body mass index is 23.78 kg/m.  Estimated Nutritional Needs:   Kcal:  2000-2300kcal/day   Protein:  100-116g/day   Fluid:  >2L/day   EDUCATION NEEDS:   No education needs identified at this time  CasKoleen Distance, RD, LDNKirbyger #- [phone] Hours Pager: [phone]

## 2016-10-28 NOTE — Progress Notes (Signed)
STROKE TEAM PROGRESS NOTE   SUBJECTIVE (INTERVAL HISTORY) Sister is at bedside.  He is arousable today. Has PEG tube placed and TF via PEG. However, he still has cortrak placed, will remove. Able to hold off BUE and BLE against gravity without drift.     OBJECTIVE Temp:  [97.7 F (36.5 C)-99.5 F (37.5 C)] 97.8 F (36.6 C) (07/14 2100) Pulse Rate:  [89-115] 91 (07/14 2100) Cardiac Rhythm: Sinus tachycardia (07/14 2046) Resp:  [20] 20 (07/14 2100) BP: (107-141)/(62-78) 141/62 (07/14 2100) SpO2:  [90 %-99 %] 97 % (07/14 2100) Weight:  [170 lb 8 oz (77.3 kg)] 170 lb 8 oz (77.3 kg) (07/14 0504)  CBC:   Recent Labs Lab 10/25/16 0505 10/27/16 0359  WBC 18.8* 19.8*  HGB 8.8* 9.2*  HCT 27.0* 28.7*  MCV 86.0 87.2  PLT 392 513*    Basic Metabolic Panel:   Recent Labs Lab 10/25/16 0505 10/27/16 0359  NA 138 137  K 4.1 4.8  CL 106 102  CO2 25 26  GLUCOSE 154* 132*  BUN 18 21*  CREATININE 1.09 1.08  CALCIUM 8.8* 9.2    Lipid Panel:     Component Value Date/Time   CHOL 92 10/21/2016 0253   TRIG 103 10/21/2016 0253   HDL 14 (L) 10/21/2016 0253   CHOLHDL 6.6 10/21/2016 0253   VLDL 21 10/21/2016 0253   LDLCALC 57 10/21/2016 0253   HgbA1c:  Lab Results  Component Value Date   HGBA1C 5.4 10/21/2016   Urine Drug Screen:     Component Value Date/Time   LABOPIA NONE DETECTED 10/19/2016 0050   COCAINSCRNUR NONE DETECTED 10/19/2016 0050   LABBENZ NONE DETECTED 10/19/2016 0050   AMPHETMU NONE DETECTED 10/19/2016 0050   THCU NONE DETECTED 10/19/2016 0050   LABBARB NONE DETECTED 10/19/2016 0050    Alcohol Level No results found for: [place] I have personally reviewed the radiological images below and agree with the radiology interpretations.  Ct Angio Head W Or Wo Contrast Ct Angio Neck W Or Wo Contrast 10/20/2016 1. No acute arterial finding.  2. Advanced left and mild right V1 segment atheromatous stenosis. No visible ulcerated plaque.  3. Motion artifact  from swallowing significantly degrades evaluation of the cervical carotids, especially on the left. No noted flow limiting stenosis.  4. Diminished number of left MCA branches in the setting of remote infarct.  5. No visible progression or hemorrhagic conversion of the acute posterior circulation infarcts.  6. Remote left MCA and ACA territory infarcts.   Dg Chest Port 1 View 10/19/2016 Increasing volume loss in the left hemithorax with progressive left lung opacities. Suspect aspiration/mucous plugging versus increasing pneumonia.   MRI brian - b/l cerebellar L>R, right pons, right midbrain, right PCA territory and left thalamus infarcts.   TTE  Left ventricle: The cavity size was normal. Wall thickness was   increased in a pattern of mild LVH. Systolic function was normal.   The estimated ejection fraction was in the range of 60% to 65%.   Wall motion was normal; there were no regional wall motion   abnormalities. - Atrial septum: The interatrial septum was hypermobile. A patent   foramen ovale cannot be excluded. 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 - Recommendations: Consider transesophageal echocardiography if   clinically indicated.  TEE pending  LE venous doppler pending   PHYSICAL EXAM  Temp:  [97.7 F (36.5 C)-99.5 F (37.5 C)] 97.8 F (36.6 C) (07/14 2100) Pulse Rate:  [  89-115] 91 (07/14 2100) Resp:  [20] 20 (07/14 2100) BP: (107-141)/(62-78) 141/62 (07/14 2100) SpO2:  [90 %-99 %] 97 % (07/14 2100) Weight:  [170 lb 8 oz (77.3 kg)] 170 lb 8 oz (77.3 kg) (07/14 0504)  General - thin, well developed, in no apparent distress.  Ophthalmologic - Fundi not visualized due to noncooperation.  Cardiovascular - Regular rate and rhythm.  Mental Status -  Level of arousal and orientation to place, and person were intact, not orientated to time. Language exam limited due to severe dysarthria and psychomotor slowing.  Cranial Nerves II - XII - II - not  blinking to visual threat on the left. III, IV, VI - Extraocular movements intact, however, pt stated diplopia on left gaze . V - Facial sensation intact bilaterally. VII - Facial movement intact bilaterally. VIII - Hearing & vestibular intact bilaterally. X - Palate elevates symmetrically, severe dysarthria. XI - Chin turning & shoulder shrug intact bilaterally. XII - Tongue protrusion not able to perform.  Motor Strength - The patient's strength was symmetrical in all extremities 3/5 BUE and BLE and pronator drift was absent.  Bulk was normal and fasciculations were absent.   Motor Tone - Muscle tone was assessed at the neck and appendages and was normal.  Reflexes - The patient's reflexes were 1+ in all extremities and he had no pathological reflexes.  Sensory - Light touch, temperature/pinprick were assessed and were symmetrical.    Coordination - not cooperative  Gait and Station - deferred   ASSESSMENT/PLAN [name] is a 66 y.o. male with history of previous strokes, hypertension and ongoing tobacco use presenting with severe dysarthria. He did not receive IV t-PA due to late presentation.  Stroke:  Extensive b/l posterior cerebral infarcts, embolic pattern, etiology unclear   Resultant  Severe dysarthria and dysphagia  CT head evolving bilateral cerebellar, right midbrain and left thalamic infarcts  MRI head - OSH - extensive multiple acute infarcts b/l cerebellar, right pons, right midbrain, right PCA and left thalamus  CTA H&N - Right V1segment origin stenosis due to atheromatous plaque.   2D Echo - EF 60% to 65%. ASA present, but PFO not able to exclude  Recommend TEE and loop recorder  LE venous doppler pending   LDL - 24  HgbA1c - 5.4  VTE prophylaxis - SCDs Diet - low sodium heart healthy Diet NPO time specified Diet NPO time specified Except for: Other (See Comments)  No antithrombotic prior to admission, now on aspirin 325 mg daily and  plavix. Continue DAPT for 3 months and then plavix alone.   Patient counseled to be compliant with his antithrombotic medications  Ongoing aggressive stroke risk factor management  Therapy recommendations: SNF  Disposition:  Pending  Hypertension  Stable  Long-term BP goal normotensive  Hyperlipidemia  Home meds: Mevacor 20 mg daily not resumed in hospital  LDL 24, goal < 70  On pravastatin 20mg  now  Continue statin at discharge  Dysphagia   Severe dysarthria  Did not pass swallow  TF via PEG now  Remove cortrak   Tobacco abuse  Current smoker  Smoking cessation counseling provided  Nicotine patch provided  Pt is willing to quit  Other Stroke Risk Factors  Advanced age  ETOH use, advised to drink no more than 1 drink per day.  Hx stroke/TIA by imaging  Other Active Problems  Aspiration pneumonia - Maxipime started 10/20/16 - leukocytosis  Elevated creatinine  Hospital day # [address]  MD PhD Stroke Neurology 10/28/2016 10:51 PM    To contact Stroke Continuity provider, please refer to [email or web address] After hours, contact General Neurology

## 2016-10-28 NOTE — Progress Notes (Signed)
Cortrak ng tube was removed per MD order. PT tolerated well. Will continue to monitor.

## 2016-10-28 NOTE — Progress Notes (Signed)
Got in hold of mom. Informed her of son going to ICU. Will call and up date her.

## 2016-10-28 NOTE — Progress Notes (Signed)
Referring Physician(s): Dr. Reyne Dumas  Supervising Physician: Daryll Brod  Patient Status:  [facility] - In-pt  Chief Complaint:  Dysphagia   Subjective:  Patient nods head that he is doing OK. No complaints.  Wife at bedside.  Allergies: Patient has no known allergies.  Medications: Prior to Admission medications   Medication Sig Start Date End Date Taking? Authorizing Provider  amLODipine-benazepril (LOTREL) 10-40 MG capsule Take 1 capsule by mouth daily.   Yes [provider]  lovastatin (MEVACOR) 20 MG tablet Take 20 mg by mouth every evening.    Yes [provider]  aspirin 325 MG tablet Place 1 tablet (325 mg total) into feeding tube daily. 10/25/16   Reyne Dumas, MD  bethanechol (URECHOLINE) 10 MG tablet Place 1 tablet (10 mg total) into feeding tube 3 (three) times daily. 10/25/16   Reyne Dumas, MD  clopidogrel (PLAVIX) 75 MG tablet Place 1 tablet (75 mg total) into feeding tube daily. 10/25/16   Reyne Dumas, MD  metoprolol tartrate (LOPRESSOR) 25 mg/10 mL SUSP Place 10 mLs (25 mg total) into feeding tube 2 (two) times daily. 10/25/16   Reyne Dumas, MD  nicotine (NICODERM CQ - DOSED IN MG/24 HOURS) 14 mg/24hr patch Place 1 patch (14 mg total) onto the skin daily. 10/25/16   Reyne Dumas, MD  Nutritional Supplements (FEEDING SUPPLEMENT, JEVITY 1.2 CAL,) LIQD Place 1,000 mLs into feeding tube continuous. 10/25/16   Reyne Dumas, MD  pravastatin (PRAVACHOL) 20 MG tablet Place 1 tablet (20 mg total) into feeding tube daily at 6 PM. 10/25/16   Reyne Dumas, MD  traMADol (ULTRAM) 50 MG tablet Take 1 tablet (50 mg total) by mouth 2 (two) times daily as needed for severe pain. 10/25/16   Reyne Dumas, MD  Water For Irrigation, Sterile (FREE WATER) SOLN Place 300 mLs into feeding tube every 4 (four) hours. 10/25/16   Reyne Dumas, MD     Vital Signs: BP 111/70 (BP Location: Right Arm)   Pulse 98   Temp 98 F (36.7 C) (Oral)   Resp 20   Ht 5\' 11"   (1.803 m)   Wt 170 lb 8 oz (77.3 kg)   SpO2 99%   BMI 23.78 kg/m   Physical Exam Awake and alert NAD Abdomen soft Gtube site looks good Tube feeds already started and doing well, no leakage.  Imaging: Ct Abdomen Wo Contrast  Result Date: 10/26/2016 CLINICAL DATA:  66 year old male with history of stroke and dysphagia. EXAM: CT ABDOMEN WITHOUT CONTRAST TECHNIQUE: Multidetector CT imaging of the abdomen was performed following the standard protocol without IV contrast. COMPARISON:  Abdominal ultrasound dated 10/16/2016 FINDINGS: Evaluation of this exam is limited in the absence of intravenous contrast. Evaluation is also limited due to streak artifact caused by patient's arms. Lower chest: Partially visualized small left pleural effusion. There is a focal area of consolidative change at the left lung base which may represent atelectasis/ scarring although pneumonia is not excluded. Clinical correlation is recommended. No intra-abdominal free air.  Small subhepatic free fluid. Hepatobiliary: Small left hepatic calcified focus most consistent with a granuloma. The liver is otherwise unremarkable as visualized. The gallbladder is not visualized and may be contracted or surgically absent. Pancreas: The pancreas is grossly unremarkable. Spleen: Normal in size without focal abnormality. Adrenals/Urinary Tract: The adrenal glands are unremarkable. The left kidney is atrophic. There is no hydronephrosis or nephrolithiasis on the right. There is mild right perinephric stranding, nonspecific. Correlation with urinalysis recommended to exclude UTI. Stomach/Bowel:  An enteric tube is noted coursing through the stomach with tip in the distal third portion of the duodenum. Oral contrast noted within the small bowel and colon. No bowel dilatation or active inflammation. Vascular/Lymphatic: There is advanced atherosclerotic calcification of the abdominal aorta. The aorta and IVC are otherwise grossly unremarkable on  this noncontrast study. No portal venous gas. There is no adenopathy. Other: Midline anterior abdominal wall surgical clips noted. No fluid collection. Musculoskeletal: There is multilevel degenerative changes of the spine. No acute fracture. IMPRESSION: 1. Small left pleural effusion and focal left lung base consolidative change, likely atelectasis or scarring. Pneumonia is not excluded. Clinical correlation is recommended. 2. Mild right perinephric stranding, nonspecific. Correlation with urinalysis recommended to exclude UTI. Atrophic left kidney. 3. No evidence of bowel obstruction. Enteric tube with tip in the distal duodenum. 4.  Aortic Atherosclerosis (ICD10-I70.0). Electronically Signed   By: Anner Crete M.D.   On: 10/26/2016 04:42   Ir Gastrostomy Tube Mod Sed  Result Date: 10/27/2016 INDICATION: Stroke EXAM: PERC PLACEMENT GASTROSTOMY MEDICATIONS: Ancef 2 g; Antibiotics were administered within 1 hour of the procedure. Glucagon 1 mg IV ANESTHESIA/SEDATION: Versed 1 mg IV; Fentanyl 50 mcg IV Moderate Sedation Time:  17 The patient was continuously monitored during the procedure by the interventional radiology nurse under my direct supervision. CONTRAST:  20 cc Isovue-300 - administered into the gastric lumen. FLUOROSCOPY TIME:  Fluoroscopy Time: 3 minutes 42 seconds (13 mGy). COMPLICATIONS: None immediate. PROCEDURE: The procedure, risks, benefits, and alternatives were explained to the patient. Questions regarding the procedure were encouraged and answered. The patient understands and consents to the procedure. The epigastrium was prepped with Betadine in a sterile fashion, and a sterile drape was applied covering the operative field. A sterile gown and sterile gloves were used for the procedure. A 5-French orogastric tube is placed under fluoroscopic guidance. Scout imaging of the abdomen confirms barium within the transverse colon. The stomach was distended with gas. Under fluoroscopic guidance,  an 18 gauge needle was utilized to puncture the anterior wall of the body of the stomach. An Amplatz wire was advanced through the needle passing a T fastener into the lumen of the stomach. The T fastener was secured for gastropexy. A 9-French sheath was inserted. A snare was advanced through the 9-French sheath. A Britta Mccreedy was advanced through the orogastric tube. It was snared then pulled out the oral cavity, pulling the snare, as well. The leading edge of the gastrostomy was attached to the snare. It was then pulled down the esophagus and out the percutaneous site. It was secured in place. Contrast was injected. The image demonstrates placement of a 20-French pull-through type gastrostomy tube into the body of the stomach. IMPRESSION: Successful 20 French pull-through gastrostomy. Electronically Signed   By: Marybelle Killings M.D.   On: 10/27/2016 16:55   Dg Chest Port 1 View  Result Date: 10/26/2016 CLINICAL DATA:  Fever. EXAM: PORTABLE CHEST 1 VIEW COMPARISON:  10/22/2016 FINDINGS: Feeding tube at least reaches the stomach. Streaky perihilar opacities, greater on the left, possibly from underlying aspiration given the presenting complaint. Aeration continues to improve. No Kerley lines, effusion, or pneumothorax. Surgical clips overlapping the left apex. 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 IMPRESSION: 1. Continued improvement in aeration. 2. Left greater than right perihilar atelectasis or pneumonia, possibly from underlying aspiration given stroke presentation. Electronically Signed   By: Monte Fantasia M.D.   On: 10/26/2016 09:23   Dg Swallowing Func-speech Pathology  Result Date:  10/24/2016 Objective Swallowing Evaluation: Type of Study: MBS-Modified Barium Swallow Study Patient Details Name: BREANNA MCDANIEL MRN: [id number] Date of Birth: 1950/06/02 Today's Date: 10/24/2016 Time: SLP Start Time (ACUTE ONLY): 1350-SLP Stop Time (ACUTE ONLY): 1404 SLP Time Calculation (min) (ACUTE ONLY): 14 min Past  Medical History: Past Medical History: Diagnosis Date . Hypertension  . Stroke [facility])  Past Surgical History: Past Surgical History: Procedure Laterality Date . KNEE SURGERY   HPI: 66 y.o. male with history of hypertension was brought to [facility] on 10/15/2016 after patient was found to be confused. As per the family who provided most of the history patient was not able to be raised and family went to check on him at home and was found on the floor confused. Patient was taken to the ER. The last contact was 2 days ago by patient's brother prior to the episode. At [facility] MRI showed multiple acute infarcts involving the both hemisphere of the cerebellum and right occipital lobe right midbrain and left thalamus the fourth ventricle was patent no hydrocephalus. 2-D echo showing EF of 55-60% normal LV size. Carotid Doppler was done which showed right ICA 40-59% stenosis with left 60-79% stenosis. Chest x-ray was showing infiltrates and patient also was febrile for which patient was started on antibiotics. Patient's creatinine at admission was 2.6 which improved to 1.5 the time of discharge. Patient also had rhabdomyolysis with CK of 7000 at the time of admission. As per the note patient was initially agitated requiring Haldol at the other hospital. Patient's mentation slowly improved gradually and family requested transfer to [facility] after discussing with neurologist at [facility] Dr. Leonel Ramsay; BSE completed 10/20/16 indicating need for objective testing; pt remained NPO. No Data Recorded Assessment / Plan / Recommendation CHL IP CLINICAL IMPRESSIONS 10/24/2016 Clinical Impression Pt with moderate pharyngeal phase dysphagia with aspiration noted after the swallow with honey-thickened liquids via 1/2 tsp amount requiring suctioning post-swallow; swallow triggered at pyriform sinuses with puree, honey-thickened liquids and pudding-thickened liquids via spoon  d/t impaired sensation; vallecular/pyriform residuals noted with honey-thickened liquids despite independent repetitive swallows; minimal residuals in valleculae/pyriforms noted with pudding-thick consistencies/puree consistencies and cleared to an insignificant amount with repetitive swallow; recommend pt remain NPO with ST providing comfort feeding with pudding-thickened liquids/puree only with nutritional support provided via Cortrak until pt mentation and strength improves for diet upgrade d/t safety purposes/moderate-severe risk of aspiration d/t pt LOA, recent change in status, weak, congested cough and severity of dysphagia SLP Visit Diagnosis Dysphagia, pharyngeal phase (R13.13) Attention and concentration deficit following -- Frontal lobe and executive function deficit following -- Impact on safety and function Moderate aspiration risk;Severe aspiration risk   CHL IP TREATMENT RECOMMENDATION 10/24/2016 Treatment Recommendations Therapy as outlined in treatment plan below   Prognosis 10/24/2016 Prognosis for Safe Diet Advancement Fair Barriers to Reach Goals Cognitive deficits;Severity of deficits Barriers/Prognosis Comment -- CHL IP DIET RECOMMENDATION 10/24/2016 SLP Diet Recommendations NPO;Other (comfort feeding provided by ST only with pudding-thick liquids/puree) Liquid Administration via Spoon Medication Administration Other (via alternative means) Compensations -- Postural Changes --   CHL IP OTHER RECOMMENDATIONS 10/24/2016 Recommended Consults -- Oral Care Recommendations Oral care QID Other Recommendations --   CHL IP FOLLOW UP RECOMMENDATIONS 10/24/2016 Follow up Recommendations Other (comment)   CHL IP FREQUENCY AND DURATION 10/24/2016 Speech Therapy Frequency (ACUTE ONLY) min 2x/week Treatment Duration 1 week      CHL IP ORAL PHASE 10/24/2016 Oral Phase WFL Oral -  Pudding Teaspoon -- Oral - Pudding Cup -- Oral - Honey Teaspoon -- Oral - Honey Cup -- Oral - Nectar Teaspoon -- Oral - Nectar Cup -- Oral -  Nectar Straw -- Oral - Thin Teaspoon -- Oral - Thin Cup -- Oral - Thin Straw -- Oral - Puree -- Oral - Mech Soft -- Oral - Regular -- Oral - Multi-Consistency -- Oral - Pill -- Oral Phase - Comment --  CHL IP PHARYNGEAL PHASE 10/24/2016 Pharyngeal Phase Impaired Pharyngeal- Pudding Teaspoon Delayed swallow initiation-pyriform sinuses Pharyngeal Material does not enter airway Pharyngeal- Pudding Cup -- Pharyngeal -- Pharyngeal- Honey Teaspoon Delayed swallow initiation-pyriform sinuses;Reduced airway/laryngeal closure;Penetration/Apiration after swallow;Moderate aspiration;Pharyngeal residue - valleculae;Pharyngeal residue - pyriform;Compensatory strategies attempted (with notebox) Pharyngeal Material enters airway, passes BELOW cords and not ejected out despite cough attempt by patient Pharyngeal- Honey Cup -- Pharyngeal -- Pharyngeal- Nectar Teaspoon -- Pharyngeal -- Pharyngeal- Nectar Cup -- Pharyngeal -- Pharyngeal- Nectar Straw -- Pharyngeal -- Pharyngeal- Thin Teaspoon -- Pharyngeal -- Pharyngeal- Thin Cup -- Pharyngeal -- Pharyngeal- Thin Straw -- Pharyngeal -- Pharyngeal- Puree Delayed swallow initiation-pyriform sinuses Pharyngeal -- Pharyngeal- Mechanical Soft -- Pharyngeal -- Pharyngeal- Regular -- Pharyngeal -- Pharyngeal- Multi-consistency -- Pharyngeal -- Pharyngeal- Pill -- Pharyngeal -- Pharyngeal Comment --  CHL IP CERVICAL ESOPHAGEAL PHASE 10/24/2016 Cervical Esophageal Phase WFL Pudding Teaspoon -- Pudding Cup -- Honey Teaspoon -- Honey Cup -- Nectar Teaspoon -- Nectar Cup -- Nectar Straw -- Thin Teaspoon -- Thin Cup -- Thin Straw -- Puree -- Mechanical Soft -- Regular -- Multi-consistency -- Pill -- Cervical Esophageal Comment -- CHL IP GO 10/24/2016 Functional Assessment Tool Used NOMS; clinical judgment Functional Limitations Swallowing Swallow Current Status (P3825) CK Swallow Goal Status (K5397) CJ Swallow Discharge Status (Q7341) (None) Motor Speech Current Status (P3790) (None) Motor Speech  Goal Status (W4097) (None) Motor Speech Goal Status (D5329) (None) Spoken Language Comprehension Current Status (J2426) (None) Spoken Language Comprehension Goal Status (S3419) (None) Spoken Language Comprehension Discharge Status (Q2229) (None) Spoken Language Expression Current Status (N9892) (None) Spoken Language Expression Goal Status (J1941) (None) Spoken Language Expression Discharge Status [phone]) (None) Attention Current Status (K4818) (None) Attention Goal Status (H6314) (None) Attention Discharge Status (H7026) (None) Memory Current Status (V7858) (None) Memory Goal Status (I5027) (None) Memory Discharge Status (X4128) (None) Voice Current Status (N8676) (None) Voice Goal Status (H2094) (None) Voice Discharge Status (B0962) (None) Other Speech-Language Pathology Functional Limitation Current Status (E3662) (None) Other Speech-Language Pathology Functional Limitation Goal Status (H4765) (None) Other Speech-Language Pathology Functional Limitation Discharge Status (Y6503) (None) Elvina Sidle, M.S., CCC-SLP 10/24/2016, 3:38 PM               Labs:  CBC:  Recent Labs  10/22/16 0147 10/23/16 0330 10/25/16 0505 10/27/16 0359  WBC 21.4* 20.9* 18.8* 19.8*  HGB 9.7* 9.5* 8.8* 9.2*  HCT 30.0* 30.1* 27.0* 28.7*  PLT 284 324 392 513*    COAGS: No results for input(s): INR, APTT in the last 8760 hours.  BMP:  Recent Labs  10/22/16 0147 10/23/16 0330 10/25/16 0505 10/27/16 0359  NA 149* 151* 138 137  K 3.5 3.8 4.1 4.8  CL 122* 119* 106 102  CO2 21* 25 25 26   GLUCOSE 161* 132* 154* 132*  BUN 17 19 18  21*  CALCIUM 9.3 9.3 8.8* 9.2  CREATININE 1.40* 1.26* 1.09 1.08  GFRNONAA 51* 58* >60 >60  GFRAA 59* >60 >60 >60    LIVER FUNCTION TESTS:  Recent Labs  10/22/16 0147 10/23/16 0330 10/25/16 0505 10/27/16 0359  BILITOT 0.8 0.3 0.5 0.8  AST 78* 46* 50* 63*  ALT 75* 59 55 84*  ALKPHOS 176* 151* 133* 214*  PROT 6.1* 6.5 6.3* 7.0  ALBUMIN 1.8* 1.9* 1.8* 1.9*    Assessment and  Plan:  Dysphagia  S/P perc gastrostomy tube by Dr. Barbie Banner 10/27/2016  Agree with tube feeds.  Routine Gtube care.  Ok to remove NG/Panda now.  Electronically Signed: Murrell Redden, PA-C 10/28/2016, 12:46 PM   I spent a total of 15 Minutes at the the patient's bedside AND on the patient's hospital floor or unit, greater than 50% of which was counseling/coordinating care for f/u gtube

## 2016-10-28 NOTE — Progress Notes (Signed)
Re read  The EKG, and it did look like V Tach. Spoke to Dr Marcene Brawn, and we discussed.  Since the patient is asymptomatic, there are no orders for now. Will continue to monitor.Bed alarm on. Cardiac monitor on, reading, will a full battery.

## 2016-10-29 ENCOUNTER — Inpatient Hospital Stay (HOSPITAL_COMMUNITY): Payer: Medicare HMO

## 2016-10-29 DIAGNOSIS — A419 Sepsis, unspecified organism: Secondary | ICD-10-CM

## 2016-10-29 DIAGNOSIS — I6312 Cerebral infarction due to embolism of basilar artery: Secondary | ICD-10-CM

## 2016-10-29 DIAGNOSIS — R6521 Severe sepsis with septic shock: Secondary | ICD-10-CM

## 2016-10-29 LAB — GLUCOSE, CAPILLARY
Glucose-Capillary: 90 mg/dL (ref 65–99)
Glucose-Capillary: 60 mg/dL — ABNORMAL LOW (ref 65–99)
Glucose-Capillary: 61 mg/dL — ABNORMAL LOW (ref 65–99)
Glucose-Capillary: 83 mg/dL (ref 65–99)
Glucose-Capillary: 138 mg/dL — ABNORMAL HIGH (ref 65–99)
Glucose-Capillary: 171 mg/dL — ABNORMAL HIGH (ref 65–99)
Glucose-Capillary: 291 mg/dL — ABNORMAL HIGH (ref 65–99)

## 2016-10-29 LAB — POCT I-STAT 3, ART BLOOD GAS (G3+)
Acid-Base Excess: 3 mmol/L — ABNORMAL HIGH (ref 0.0–2.0)
Acid-base deficit: 2 mmol/L (ref 0.0–2.0)
Bicarbonate: 22.1 mmol/L (ref 20.0–28.0)
Bicarbonate: 30.4 mmol/L — ABNORMAL HIGH (ref 20.0–28.0)
O2 Saturation: 100 %
O2 Saturation: 97 %
Patient temperature: 97.8
Patient temperature: 98
TCO2: 23 mmol/L (ref 0–100)
TCO2: 32 mmol/L (ref 0–100)
pCO2 arterial: 33.8 mmHg (ref 32.0–48.0)
pCO2 arterial: 55.1 mmHg — ABNORMAL HIGH (ref 32.0–48.0)
pH, Arterial: 7.347 — ABNORMAL LOW (ref 7.350–7.450)
pH, Arterial: 7.422 (ref 7.350–7.450)
pO2, Arterial: 306 mmHg — ABNORMAL HIGH (ref 83.0–108.0)
pO2, Arterial: 88 mmHg (ref 83.0–108.0)

## 2016-10-29 LAB — URINALYSIS, ROUTINE W REFLEX MICROSCOPIC
Bilirubin Urine: NEGATIVE
Glucose, UA: NEGATIVE mg/dL
Hgb urine dipstick: NEGATIVE
Ketones, ur: NEGATIVE mg/dL
Leukocytes, UA: NEGATIVE
Nitrite: NEGATIVE
Protein, ur: 100 mg/dL — AB
Specific Gravity, Urine: 1.016 (ref 1.005–1.030)
pH: 5 (ref 5.0–8.0)

## 2016-10-29 LAB — BASIC METABOLIC PANEL
Anion gap: 8 (ref 5–15)
BUN: 34 mg/dL — ABNORMAL HIGH (ref 6–20)
CO2: 22 mmol/L (ref 22–32)
Calcium: 8.6 mg/dL — ABNORMAL LOW (ref 8.9–10.3)
Chloride: 105 mmol/L (ref 101–111)
Creatinine, Ser: 2.02 mg/dL — ABNORMAL HIGH (ref 0.61–1.24)
GFR calc Af Amer: 38 mL/min — ABNORMAL LOW (ref >60)
GFR calc non Af Amer: 33 mL/min — ABNORMAL LOW (ref >60)
Glucose, Bld: 164 mg/dL — ABNORMAL HIGH (ref 65–99)
Potassium: 4.9 mmol/L (ref 3.5–5.1)
Sodium: 135 mmol/L (ref 135–145)

## 2016-10-29 LAB — CBC WITH DIFFERENTIAL/PLATELET
Basophils Absolute: 0 10*3/uL (ref 0.0–0.1)
Basophils Relative: 0 %
Eosinophils Absolute: 0 10*3/uL (ref 0.0–0.7)
Eosinophils Relative: 0 %
HCT: 29.1 % — ABNORMAL LOW (ref 39.0–52.0)
Hemoglobin: 9.2 g/dL — ABNORMAL LOW (ref 13.0–17.0)
Lymphocytes Relative: 7 %
Lymphs Abs: 1.4 10*3/uL (ref 0.7–4.0)
MCH: 28 pg (ref 26.0–34.0)
MCHC: 31.6 g/dL (ref 30.0–36.0)
MCV: 88.4 fL (ref 78.0–100.0)
Monocytes Absolute: 1 10*3/uL (ref 0.1–1.0)
Monocytes Relative: 5 %
Neutro Abs: 17.1 10*3/uL — ABNORMAL HIGH (ref 1.7–7.7)
Neutrophils Relative %: 88 %
Platelets: 700 10*3/uL — ABNORMAL HIGH (ref 150–400)
RBC: 3.29 MIL/uL — ABNORMAL LOW (ref 4.22–5.81)
RDW: 16 % — ABNORMAL HIGH (ref 11.5–15.5)
WBC: 19.5 10*3/uL — ABNORMAL HIGH (ref 4.0–10.5)

## 2016-10-29 LAB — TRIGLYCERIDES: Triglycerides: 95 mg/dL (ref <150)

## 2016-10-29 LAB — COMPREHENSIVE METABOLIC PANEL
ALT: 58 U/L (ref 17–63)
AST: 43 U/L — ABNORMAL HIGH (ref 15–41)
Albumin: 1.8 g/dL — ABNORMAL LOW (ref 3.5–5.0)
Alkaline Phosphatase: 186 U/L — ABNORMAL HIGH (ref 38–126)
Anion gap: 9 (ref 5–15)
BUN: 30 mg/dL — ABNORMAL HIGH (ref 6–20)
CO2: 26 mmol/L (ref 22–32)
Calcium: 9.7 mg/dL (ref 8.9–10.3)
Chloride: 102 mmol/L (ref 101–111)
Creatinine, Ser: 1.25 mg/dL — ABNORMAL HIGH (ref 0.61–1.24)
GFR calc Af Amer: >60 mL/min (ref >60)
GFR calc non Af Amer: 58 mL/min — ABNORMAL LOW (ref >60)
Glucose, Bld: 108 mg/dL — ABNORMAL HIGH (ref 65–99)
Potassium: 5.4 mmol/L — ABNORMAL HIGH (ref 3.5–5.1)
Sodium: 137 mmol/L (ref 135–145)
Total Bilirubin: 0.8 mg/dL (ref 0.3–1.2)
Total Protein: 7.3 g/dL (ref 6.5–8.1)

## 2016-10-29 LAB — PROTIME-INR
INR: 1.11
Prothrombin Time: 14.3 seconds (ref 11.4–15.2)

## 2016-10-29 LAB — LACTIC ACID, PLASMA
Lactic Acid, Venous: 3.7 mmol/L (ref 0.5–1.9)
Lactic Acid, Venous: 4.4 mmol/L (ref 0.5–1.9)
Lactic Acid, Venous: 2.3 mmol/L (ref 0.5–1.9)

## 2016-10-29 LAB — PHOSPHORUS
Phosphorus: 4.9 mg/dL — ABNORMAL HIGH (ref 2.5–4.6)
Phosphorus: 4 mg/dL (ref 2.5–4.6)

## 2016-10-29 LAB — MAGNESIUM
Magnesium: 2.2 mg/dL (ref 1.7–2.4)
Magnesium: 1.9 mg/dL (ref 1.7–2.4)

## 2016-10-29 LAB — TROPONIN I: Troponin I: <0.03 ng/mL (ref <0.03)

## 2016-10-29 LAB — PROCALCITONIN: Procalcitonin: 5.93 ng/mL

## 2016-10-29 LAB — CORTISOL: Cortisol, Plasma: 23.4 ug/dL

## 2016-10-29 MED ORDER — VANCOMYCIN HCL IN DEXTROSE 1-5 GM/200ML-% IV SOLN
1000.0000 mg | Freq: Two times a day (BID) | INTRAVENOUS | Status: DC
  Administered 2016-10-29 – 2016-10-31 (×4): 1000 mg via INTRAVENOUS
  Filled 2016-10-29 (×5): qty 200

## 2016-10-29 MED ORDER — PIPERACILLIN-TAZOBACTAM 3.375 G IVPB 30 MIN: 3.3750 g | Freq: Once | INTRAVENOUS | Status: DC

## 2016-10-29 MED ORDER — SODIUM CHLORIDE 0.9 % IV BOLUS (SEPSIS)
1000.0000 mL | Freq: Once | INTRAVENOUS | Status: AC
  Administered 2016-10-29: 1000 mL via INTRAVENOUS

## 2016-10-29 MED ORDER — MIDAZOLAM HCL 50 MG/10ML IJ SOLN
0.5000 mg/h | INTRAMUSCULAR | Status: DC
Start: 2016-10-29 — End: 2016-10-30
  Administered 2016-10-29: 0.5 mg/h via INTRAVENOUS
  Filled 2016-10-29: qty 10

## 2016-10-29 MED ORDER — HEPARIN SODIUM (PORCINE) 5000 UNIT/ML IJ SOLN
5000.0000 [IU] | Freq: Three times a day (TID) | INTRAMUSCULAR | Status: DC
  Administered 2016-10-29 – 2016-11-29 (×93): 5000 [IU] via SUBCUTANEOUS
  Filled 2016-10-29 (×95): qty 1

## 2016-10-29 MED ORDER — CHLORHEXIDINE GLUCONATE 0.12% ORAL RINSE (MEDLINE KIT)
15.0000 mL | Freq: Two times a day (BID) | OROMUCOSAL | Status: DC
  Administered 2016-10-29 – 2016-11-25 (×52): 15 mL via OROMUCOSAL
  Filled 2016-10-29 (×2): qty 15

## 2016-10-29 MED ORDER — FAMOTIDINE IN NACL 20-0.9 MG/50ML-% IV SOLN
20.0000 mg | Freq: Two times a day (BID) | INTRAVENOUS | Status: DC
  Administered 2016-10-29 (×3): 20 mg via INTRAVENOUS
  Filled 2016-10-29 (×4): qty 50

## 2016-10-29 MED ORDER — DEXTROSE-NACL 5-0.9 % IV SOLN
INTRAVENOUS | Status: DC
  Administered 2016-10-29 – 2016-11-02 (×7): via INTRAVENOUS

## 2016-10-29 MED ORDER — DEXTROSE 50 % IV SOLN
INTRAVENOUS | Status: AC
  Administered 2016-10-29: 50 mL
  Filled 2016-10-29: qty 50

## 2016-10-29 MED ORDER — MIDAZOLAM HCL 2 MG/2ML IJ SOLN
1.0000 mg | INTRAMUSCULAR | Status: DC | PRN
  Filled 2016-10-29: qty 2

## 2016-10-29 MED ORDER — MIDAZOLAM HCL 2 MG/2ML IJ SOLN
2.0000 mg | Freq: Once | INTRAMUSCULAR | Status: AC
  Administered 2016-10-29: 2 mg via INTRAVENOUS

## 2016-10-29 MED ORDER — MIDAZOLAM HCL 2 MG/2ML IJ SOLN: 1.0000 mg | INTRAMUSCULAR | Status: DC | PRN

## 2016-10-29 MED ORDER — FENTANYL CITRATE (PF) 100 MCG/2ML IJ SOLN: 50.0000 ug | Freq: Once | INTRAMUSCULAR | Status: AC

## 2016-10-29 MED ORDER — NOREPINEPHRINE BITARTRATE 1 MG/ML IV SOLN
0.0000 ug/min | INTRAVENOUS | Status: DC
  Administered 2016-10-29: 20 ug/min via INTRAVENOUS
  Administered 2016-10-29: 2 ug/min via INTRAVENOUS
  Filled 2016-10-29 (×3): qty 4

## 2016-10-29 MED ORDER — PIPERACILLIN-TAZOBACTAM 3.375 G IVPB 30 MIN
3.3750 g | Freq: Once | INTRAVENOUS | Status: AC
  Administered 2016-10-29: 3.375 g via INTRAVENOUS
  Filled 2016-10-29: qty 50

## 2016-10-29 MED ORDER — FENTANYL 2500MCG IN NS 250ML (10MCG/ML) PREMIX INFUSION
25.0000 ug/h | INTRAVENOUS | Status: DC
  Administered 2016-10-29: 200 ug/h via INTRAVENOUS
  Administered 2016-10-29: 75 ug/h via INTRAVENOUS
  Filled 2016-10-29 (×2): qty 250

## 2016-10-29 MED ORDER — ORAL CARE MOUTH RINSE
15.0000 mL | Freq: Four times a day (QID) | OROMUCOSAL | Status: DC
  Administered 2016-10-29 – 2016-11-25 (×91): 15 mL via OROMUCOSAL

## 2016-10-29 MED ORDER — FENTANYL CITRATE (PF) 100 MCG/2ML IJ SOLN
INTRAMUSCULAR | Status: AC
Start: 2016-10-29 — End: 2016-10-29
  Filled 2016-10-29: qty 2

## 2016-10-29 MED ORDER — ETOMIDATE 2 MG/ML IV SOLN
30.0000 mg | Freq: Once | INTRAVENOUS | Status: AC
  Administered 2016-10-29: 30 mg via INTRAVENOUS
  Filled 2016-10-29: qty 15

## 2016-10-29 MED ORDER — FENTANYL BOLUS VIA INFUSION
25.0000 ug | INTRAVENOUS | Status: DC | PRN
  Administered 2016-10-30: 25 ug via INTRAVENOUS
  Filled 2016-10-29: qty 25

## 2016-10-29 MED ORDER — PIPERACILLIN-TAZOBACTAM 3.375 G IVPB
3.3750 g | Freq: Three times a day (TID) | INTRAVENOUS | Status: AC
  Administered 2016-10-29 – 2016-11-04 (×21): 3.375 g via INTRAVENOUS
  Filled 2016-10-29 (×21): qty 50

## 2016-10-29 MED ORDER — PROPOFOL 1000 MG/100ML IV EMUL
INTRAVENOUS | Status: AC
  Filled 2016-10-29: qty 100

## 2016-10-29 MED ORDER — INSULIN ASPART 100 UNIT/ML ~~LOC~~ SOLN
0.0000 [IU] | SUBCUTANEOUS | Status: DC
  Administered 2016-10-29: 5 [IU] via SUBCUTANEOUS
  Administered 2016-10-29: 2 [IU] via SUBCUTANEOUS
  Administered 2016-10-30 – 2016-11-20 (×21): 1 [IU] via SUBCUTANEOUS
  Administered 2016-11-20: 2 [IU] via SUBCUTANEOUS
  Administered 2016-11-21 – 2016-11-29 (×20): 1 [IU] via SUBCUTANEOUS
  Administered 2016-11-29: 2 [IU] via SUBCUTANEOUS

## 2016-10-29 MED ORDER — PROPOFOL 1000 MG/100ML IV EMUL
5.0000 ug/kg/min | INTRAVENOUS | Status: DC
  Administered 2016-10-29: 10 ug/kg/min via INTRAVENOUS
  Administered 2016-10-29: 20 ug/kg/min via INTRAVENOUS
  Administered 2016-10-30: 5 ug/kg/min via INTRAVENOUS
  Filled 2016-10-29 (×2): qty 100

## 2016-10-29 MED ORDER — DOCUSATE SODIUM 50 MG/5ML PO LIQD: 100.0000 mg | Freq: Two times a day (BID) | ORAL | Status: DC | PRN

## 2016-10-29 MED ORDER — SUCCINYLCHOLINE CHLORIDE 20 MG/ML IJ SOLN
100.0000 mg | Freq: Once | INTRAMUSCULAR | Status: AC
  Administered 2016-10-29: 100 mg via INTRAVENOUS
  Filled 2016-10-29: qty 5

## 2016-10-29 MED ORDER — VANCOMYCIN HCL IN DEXTROSE 1-5 GM/200ML-% IV SOLN: 1000.0000 mg | Freq: Once | INTRAVENOUS | Status: DC

## 2016-10-29 MED ORDER — VANCOMYCIN HCL 10 G IV SOLR
1500.0000 mg | Freq: Once | INTRAVENOUS | Status: AC
  Administered 2016-10-29: 1500 mg via INTRAVENOUS
  Filled 2016-10-29: qty 1500

## 2016-10-29 MED ORDER — MIDAZOLAM HCL 2 MG/2ML IJ SOLN
INTRAMUSCULAR | Status: AC
  Filled 2016-10-29: qty 2

## 2016-10-29 MED FILL — Medication: Qty: 1 | Status: AC

## 2016-10-29 NOTE — Progress Notes (Signed)
CRITICAL VALUE ALERT  Critical Value:  Lactic acid 3.7  Date & Time Notied:  10/29/16 2:12 AM  Provider Notified: Dr. Jimmey Ralph.  Orders Received/Actions taken:

## 2016-10-29 NOTE — Procedures (Signed)
Central Venous Catheter Insertion Procedure Note Scott Rivera [id number] 08-01-1950  Procedure: Insertion of Central Venous Catheter Indications: Drug and/or fluid administration  Procedure Details Consent: Risks of procedure as well as the alternatives and risks of each were explained to the (patient/caregiver).  Consent for procedure obtained. Time Out: Verified patient identification, verified procedure, site/side was marked, verified correct patient position, special equipment/implants available, medications/allergies/relevent history reviewed, required imaging and test results available.  Performed  Maximum sterile technique was used including antiseptics, cap, gloves, gown, hand hygiene, mask and sheet. Skin prep: Chlorhexidine; local anesthetic administered A antimicrobial bonded/coated triple lumen catheter was placed in the right subclavian vein using the Seldinger technique.  Evaluation Blood flow good Complications: No apparent complications Patient did tolerate procedure well. Chest X-ray ordered to verify placement.  CXR: pending.  Scott Rivera 10/29/2016, 4:22 PM  Scott Rivera ACNP-BC [place] Pager # [phone] OR # [phone] if no answer

## 2016-10-29 NOTE — Progress Notes (Signed)
PULMONARY / CRITICAL CARE MEDICINE   Name: Scott Rivera MRN: [id number] DOB: Feb 01, 1951    ADMISSION DATE:  10/19/2016 CONSULTATION DATE: 10/29/16  CHIEF COMPLAINT: Respiratory distress  HISTORY OF PRESENT ILLNESS:   66yoM with HTN, CKD, Anemia, brought initially to a community hospital on 7/1 with confusion after being found down on the floor. MRI brain on admission showed multiple acute infarcts within both hemispheres of cerebellum and right occipital lobe right midbrain and left thalamus. CXR at that time also showed multifocal pneumonia. He initially had rhabdomyolysis and AKI, both of which resolved. Family then requested transfer to [facility] on 10/19/16. PEG was placed on 10/27/16. He completed a 7 day course of Cefepime then transitioned to Augmentin. Then overnight patient aspirated and developed respiratory distress with RR in 40's with worsening acute hypoxic respiratory failure, precipitating emergent transfer to ICU for intubation. Intubation complicated by large emesis with aspiration during the procedure. PEG placed to suction and large volume of gastric secretions removed.   PAST MEDICAL HISTORY :  He  has a past medical history of Hypertension and Stroke ([place]).  PAST SURGICAL HISTORY: He  has a past surgical history that includes Knee surgery and IR GASTROSTOMY TUBE MOD SED (10/27/2016).  No Known Allergies  No current facility-administered medications on file prior to encounter.    No current outpatient prescriptions on file prior to encounter.    FAMILY HISTORY:  His indicated that the status of his brother is unknown. He indicated that the status of his neg hx is unknown.    SOCIAL HISTORY: He  reports that he has been smoking.  He has never used smokeless tobacco. He reports that he drinks alcohol.  REVIEW OF SYSTEMS:   Unable to obtain as pt is intubated  SUBJECTIVE:  Remains intubated Persistent hypotension event after several fluid bolus.   VITAL  SIGNS: BP (!) 75/48   Pulse (!) 123   Temp 98 F (36.7 C) (Oral)   Resp 20   Ht 5\' 11"  (1.803 m)   Wt 165 lb 12.6 oz (75.2 kg)   SpO2 98%   BMI 23.12 kg/m   HEMODYNAMICS:    VENTILATOR SETTINGS: Vent Mode: PRVC FiO2 (%):  [60 %-100 %] 60 % Set Rate:  [16 bmp-20 bmp] 20 bmp Vt Set:  [600 mL] 600 mL PEEP:  [10 cmH20-12 cmH20] 10 cmH20 Plateau Pressure:  [30 cmH20] 30 cmH20  INTAKE / OUTPUT: I/O last 3 completed shifts: In: 513.5 [I.V.:153.5; NG/GT:260; IV Piggyback:100] Out: 4160 [Urine:3860; Drains:300]  PHYSICAL EXAMINATION: Blood pressure (!) 71/48, pulse (!) 114, temperature 98 F (36.7 C), temperature source Oral, resp. rate (!) 23, height 5\' 11"  (1.803 m), weight 165 lb 12.6 oz (75.2 kg), SpO2 100 %. Gen:      No acute distress, chronically ill. malnourished HEENT:  EOMI, sclera anicteric, ETT in place Neck:     No masses; no thyromegaly Lungs:    Clear to auscultation bilaterally; normal respiratory effort CV:         Regular rate and rhythm; no murmurs Abd:      + bowel sounds; soft, non-tender; no palpable masses, no distension Ext:    No edema; adequate peripheral perfusion Skin:      Warm and dry; no rash Neuro: Sedated, unresponsive  LABS:  BMET  Recent Labs Lab 10/25/16 0505 10/27/16 0359 10/29/16 0126  NA 138 137 137  K 4.1 4.8 5.4*  CL 106 102 102  CO2 25 26 26   BUN  18 21* 30*  CREATININE 1.09 1.08 1.25*  GLUCOSE 154* 132* 108*    Electrolytes  Recent Labs Lab 10/25/16 0505 10/27/16 0359 10/29/16 0126  CALCIUM 8.8* 9.2 9.7  MG  --   --  2.2  PHOS  --   --  4.9*    CBC  Recent Labs Lab 10/25/16 0505 10/27/16 0359 10/29/16 0126  WBC 18.8* 19.8* 19.5*  HGB 8.8* 9.2* 9.2*  HCT 27.0* 28.7* 29.1*  PLT 392 513* 700*    Coag's  Recent Labs Lab 10/29/16 0126  INR 1.11    Sepsis Markers  Recent Labs Lab 10/29/16 0126 10/29/16 0133 10/29/16 0423  LATICACIDVEN  --  3.7* 4.4*  PROCALCITON 5.93  --   --      ABG  Recent Labs Lab 10/29/16 0047  PHART 7.347*  PCO2ART 55.1*  PO2ART 306.0*    Liver Enzymes  Recent Labs Lab 10/25/16 0505 10/27/16 0359 10/29/16 0126  AST 50* 63* 43*  ALT 55 84* 58  ALKPHOS 133* 214* 186*  BILITOT 0.5 0.8 0.8  ALBUMIN 1.8* 1.9* 1.8*    Cardiac Enzymes  Recent Labs Lab 10/29/16 0126  TROPONINI <0.03    Glucose  Recent Labs Lab 10/28/16 1606 10/28/16 1950 10/29/16 0246 10/29/16 0414 10/29/16 0811 10/29/16 0813  GLUCAP 118* 97 90 60* 61* 83    Imaging Dg Chest Port 1 View  Result Date: 10/29/2016 CLINICAL DATA:  Endotracheal tube placement. EXAM: PORTABLE CHEST 1 VIEW COMPARISON:  Chest radiograph October 28, 2016 FINDINGS: Endotracheal tube tip projects 4.4 cm above the carina. Cardiac silhouette is upper limits normal in size, mediastinal silhouette is nonsuspicious. RIGHT middle lobe airspace opacity with diffuse mild interstitial prominence. No pleural effusion. No pneumothorax. Surgical clips project in LEFT chest. Osseous structures are unchanged. IMPRESSION: Endotracheal tube tip projects 4.4 cm above the carina. Worsening RIGHT middle lobe consolidation concerning for new bronchopneumonia. Electronically Signed   By: Elon Alas M.D.   On: 10/29/2016 01:11   Dg Chest Port 1 View  Result Date: 10/29/2016 CLINICAL DATA:  Respiratory distress EXAM: PORTABLE CHEST 1 VIEW COMPARISON:  10/26/2016 FINDINGS: Surgical clips at the left neck. Removal of esophageal tube. Patchy infiltrates at the right base. No pleural effusion. Stable cardiomediastinal silhouette with central congestion. IMPRESSION: Interval worsening of right perihilar and basilar pulmonary opacity which may reflect aspiration given history. Continued central vascular congestion. Electronically Signed   By: Donavan Foil M.D.   On: 10/29/2016 00:04   STUDIES:   CULTURES: Bcx 7/15 > Resp cx 7/15 >  ANTIBIOTICS: Cefepime 7/1-7/6 Augmentin 7/12-7/15 Zosyn  7/15> Vanco 7/15>  SIGNIFICANT EVENTS: 7/1 Admit to [place] with [place] 7/5- transfer to [facility] 7/15- Aspirated, intubated  LINES/TUBES: PEG 7/13> ETT 7/15 >  DISCUSSION: 66yoM with HTN, CKD, Anemia, brought initially to a community hospital on 7/1 with confusion after being found down on the floor. Found to have multiple ischemic CVA's as well as multifocal pneumonia. Initially had AKI and Rhabdo as well, now resolved. PEG was placed on 10/27/16. He completed a 7 day course of Cefepime then transitioned to Augmentin.Overnight patient aspirated and developed severe respiratory distress requiring intubation.   ASSESSMENT / PLAN:  PULMONARY A: Acute resp failure Reccurent aspiration P:   Continue vent support. Follow CXR, ABG SBTs if stable tomorrow  CARDIOVASCULAR A:  Septic shock Elevated LA Atrial septal aneurysm. ? PFO P:  Continue IVF CVL placement Levophed to maintain MAP > 65 Follow LA TEE pending to eval  PFO  RENAL A:   AKI P:   Follow urine output and Cr  GASTROINTESTINAL A:   Possible gastroparesis, ileus causing aspiraton P:   Keep NPO PEG tube to intermittent suction.  Check abd x ray  HEMATOLOGIC A:   Leukocytosis secondary to sepsis P:  Follow CBC  INFECTIOUS A:   Severe sepsis secondary to aspiration P:   Broad abx Follow Pct, cultures  ENDOCRINE A:   No acute issues P:    NEUROLOGIC A:   Multiple CVA P:   Neuro eval in progress to assess PFO, DVT On plavix  FAMILY  - Updates: No family at bedside - Inter-disciplinary family meet or Palliative Care meeting due by:  7/22  The patient is critically ill with multiple organ system failure and requires high complexity decision making for assessment and support, frequent evaluation and titration of therapies, advanced monitoring, review of radiographic studies and interpretation of complex data.   Critical Care Time devoted to patient care services, exclusive of separately  billable procedures, described in this note is 35 minutes.   Marshell Garfinkel MD [place] Pager [phone] If no answer or after 3pm call: [phone] 10/29/2016, 9:19 AM

## 2016-10-29 NOTE — Procedures (Signed)
Endotracheal Intubation Procedure Note Indication for endotracheal intubation: respiratory failure Sedation: etomidate and midazolam Paralytic: succinylcholine Equipment: Macintosh 3 laryngoscope blade and 8.3mm cuffed endotracheal tube Cricoid Pressure: yes Number of attempts: 1 ETT location confirmed by by auscultation, by CXR and ETCO2 monitor.  Patient arrived to ICU as transfer from the floor with severe respiratory distress from an aspiration pneumonia. Emergently intubated patient. Mid-intubation he had a very large volume emesis, some of which he aspirated. CXR pending.

## 2016-10-29 NOTE — Progress Notes (Signed)
Pharmacy Antibiotic Note  Scott Rivera is a 66 y.o. male admitted on 10/19/2016 from [facility] after dx of multiple acute strokes on 7/1, now with acute respiratory failure requiring intubation and tx to ICU, concern for sepsis.  Pharmacy has been consulted for Vancocin and Zosyn dosing.  Plan: Vancomycin 1500mg  x1 then 1000mg  IV every 12 hours.  Goal trough 15-20 mcg/mL. Zosyn 3.375g IV q8h (4 hour infusion).  Height: 5\' 11"  (180.3 cm) Weight: 170 lb 8 oz (77.3 kg) IBW/kg (Calculated) : 75.3  Temp (24hrs), Avg:98.2 F (36.8 C), Min:97.7 F (36.5 C), Max:99.5 F (37.5 C)   Recent Labs Lab 10/22/16 0147 10/23/16 0330 10/25/16 0505 10/27/16 0359  WBC 21.4* 20.9* 18.8* 19.8*  CREATININE 1.40* 1.26* 1.09 1.08    Estimated Creatinine Clearance: 71.7 mL/min (by C-G formula based on SCr of 1.08 mg/dL).    No Known Allergies  Antimicrobials this admission: Vanc 7/5 >> 7/6; 7/15 >>  Cefepime 7/5 >> 7/11 Augmentin 7/12 >> 7/14 Zosyn 7/15 >>   Microbiology results: 7/15 BCx:  7/15 Sputum:   7/6 MRSA PCR: neg  Thank you for allowing pharmacy to be a part of this patient's care.  Wynona Neat, PharmD, BCPS  10/29/2016 12:38 AM

## 2016-10-29 NOTE — Progress Notes (Signed)
Rn reports pt had a coughing episode at 11:00pm.  She reports pt vomited and appeared to aspirate.  She reports pt had decreased 02 sats. Tachycardia and tachypnea.  Respiratory therapy attempted deep suction with slight improvement initially but pt continued to decrease sats.  Rapid response assessed.   PE 66y/o male in respiratory distress,  resp rate 40's heart rate 120's  Lungs harsh breath sound. Rhonchi all lobes On NRB at 100%  I spoke to Critical Care and pt transferred to ICU.  Intubated in ICU.

## 2016-10-29 NOTE — Consult Note (Signed)
PULMONARY / CRITICAL CARE MEDICINE   Name: Scott Rivera MRN: [id number] DOB: Aug 19, 1950    ADMISSION DATE:  10/19/2016 CONSULTATION DATE: 10/29/16  CHIEF COMPLAINT: Respiratory distress  HISTORY OF PRESENT ILLNESS:   66yoM with HTN, CKD, Anemia, brought initially to a community hospital on 7/1 with confusion after being found down on the floor. MRI brain on admission showed multiple acute infarcts within both hemispheres of cerebellum and right occipital lobe right midbrain and left thalamus. CXR at that time also showed multifocal pneumonia. He initially had rhabdomyolysis and AKI, both of which resolved. Family then requested transfer to [facility] on 10/19/16. PEG was placed on 10/27/16. He completed a 7 day course of Cefepime then transitioned to Augmentin. Then overnight patient aspirated and developed respiratory distress with RR in 40's with worsening acute hypoxic respiratory failure, precipitating emergent transfer to ICU.  On my initial exam, patient with RR 45, loud Rhonchi b/l, Severe accessory muscle use, Pox in low 80's on 100% NRB. Patient emergently intubated (see separate procedure note). Intubation complicated by large emesis with aspiration during the procedure. PEG placed to suction and large volume of gastric secretions removed.   PAST MEDICAL HISTORY :  He  has a past medical history of Hypertension and Stroke ([place]).  PAST SURGICAL HISTORY: He  has a past surgical history that includes Knee surgery and IR GASTROSTOMY TUBE MOD SED (10/27/2016).  No Known Allergies  No current facility-administered medications on file prior to encounter.    No current outpatient prescriptions on file prior to encounter.   FAMILY HISTORY:  His indicated that the status of his brother is unknown. He indicated that the status of his neg hx is unknown.   SOCIAL HISTORY: He  reports that he has been smoking.  He has never used smokeless tobacco. He reports that he drinks alcohol.  REVIEW OF  SYSTEMS:   Review of Systems  Unable to perform ROS: Critical illness   VITAL SIGNS: BP (!) 76/57   Pulse (!) 138   Temp 97.8 F (36.6 C) (Oral)   Resp (!) 45   Ht 5\' 11"  (1.803 m)   Wt 77.3 kg (170 lb 8 oz)   SpO2 100%   BMI 23.78 kg/m   VENTILATOR SETTINGS: Vent Mode: PRVC FiO2 (%):  [100 %] 100 % Set Rate:  [16 bmp-20 bmp] 20 bmp Vt Set:  [600 mL] 600 mL PEEP:  [12 cmH20] 12 cmH20  INTAKE / OUTPUT: I/O last 3 completed shifts: In: 260 [NG/GT:260] Out: 4000 [Urine:4000]  PHYSICAL EXAMINATION: General: Elderly cachectic AAM, In Severe respiratory distress, Critically ill Neuro: Awake but not responding to commands, Is spontaneously moving all extremities HEENT: PERRL, EOMI, OP clear, MM moist Cardiovascular: Tachycardic with a regular rhythm Lungs: Loud Rhonchi b/l; Severe respiratory distress with severe accessory muscle use, Pox low 80's on 100% NRB Abdomen: Soft NTND, PEG in place; absent bowel sounds Musculoskeletal: no LE edema; 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 Skin: no rashes   LABS:  BMET  Recent Labs Lab 10/25/16 0505 10/27/16 0359 10/29/16 0126  NA 138 137 137  K 4.1 4.8 5.4*  CL 106 102 102  CO2 25 26 26   BUN 18 21* 30*  CREATININE 1.09 1.08 1.25*  GLUCOSE 154* 132* 108*   Electrolytes  Recent Labs Lab 10/25/16 0505 10/27/16 0359 10/29/16 0126  CALCIUM 8.8* 9.2 9.7  MG  --   --  2.2  PHOS  --   --  4.9*   CBC  Recent Labs Lab 10/25/16 0505 10/27/16 0359 10/29/16 0126  WBC 18.8* 19.8* 19.5*  HGB 8.8* 9.2* 9.2*  HCT 27.0* 28.7* 29.1*  PLT 392 513* 700*   Coag's  Recent Labs Lab 10/29/16 0126  INR 1.11   Sepsis Markers  Recent Labs Lab 10/29/16 0133  LATICACIDVEN 3.7*   ABG  Recent Labs Lab 10/29/16 0047  PHART 7.347*  PCO2ART 55.1*  PO2ART 306.0*   Liver Enzymes  Recent Labs Lab 10/25/16 0505 10/27/16 0359 10/29/16 0126  AST 50* 63* 43*  ALT 55 84* 58  ALKPHOS 133* 214* 186*  BILITOT 0.5 0.8 0.8   ALBUMIN 1.8* 1.9* 1.8*   Cardiac Enzymes  Recent Labs Lab 10/29/16 0126  TROPONINI <0.03   Glucose  Recent Labs Lab 10/28/16 0000 10/28/16 0436 10/28/16 0736 10/28/16 1113 10/28/16 1606 10/28/16 1950  GLUCAP 84 76 99 107* 118* 97   Imaging Dg Chest Port 1 View  Result Date: 10/29/2016 CLINICAL DATA:  Endotracheal tube placement. EXAM: PORTABLE CHEST 1 VIEW COMPARISON:  Chest radiograph October 28, 2016 FINDINGS: Endotracheal tube tip projects 4.4 cm above the carina. Cardiac silhouette is upper limits normal in size, mediastinal silhouette is nonsuspicious. RIGHT middle lobe airspace opacity with diffuse mild interstitial prominence. No pleural effusion. No pneumothorax. Surgical clips project in LEFT chest. Osseous structures are unchanged. IMPRESSION: Endotracheal tube tip projects 4.4 cm above the carina. Worsening RIGHT middle lobe consolidation concerning for new bronchopneumonia. Electronically Signed   By: Elon Alas M.D.   On: 10/29/2016 01:11   Dg Chest Port 1 View  Result Date: 10/29/2016 CLINICAL DATA:  Respiratory distress EXAM: PORTABLE CHEST 1 VIEW COMPARISON:  10/26/2016 FINDINGS: Surgical clips at the left neck. Removal of esophageal tube. Patchy infiltrates at the right base. No pleural effusion. Stable cardiomediastinal silhouette with central congestion. IMPRESSION: Interval worsening of right perihilar and basilar pulmonary opacity which may reflect aspiration given history. Continued central vascular congestion. Electronically Signed   By: Donavan Foil M.D.   On: 10/29/2016 00:04   LINES/TUBES: 20G PIV   ASSESSMENT / PLAN: 50NLZ with HTN, CKD, Anemia, brought initially to a community hospital on 7/1 with confusion after being found down on the floor. Found to have multiple ischemic CVA's as well as multifocal pneumonia. Initially had AKI and Rhabdo as well, now resolved. PEG was placed on 10/27/16. He completed a 7 day course of Cefepime then transitioned  to Augmentin.Overnight patient aspirated and developed severe respiratory distress requiring intubation.   PULMONARY: 1. Acute hypoxic respiratory failure; Aspiration pneumonia: - s/p course of cefepime; currently on augmentin. Will switch to Vanc and Zosyn - pan-culture - recheck ABG and adjust vent as tolerated - CXR in AM - GI and DVT prophylaxis  CARDIOVASCULAR 1. Sinus tachycardia: - likely related to his sepsis - giving IVF's   2. Hx HTN: - currently soft BP; hold antihypertensives.   RENAL 1. AKI-on-CKD; Hyperkalemia - had AKI on admission which resolved, with creatinine improving to 1.08, now up to 1.25 again, possibly combination of pre-renal as well as ATN from the hypoxia - place foley catheter - IVF's; monitor UOP - repeat BMP  GASTROINTESTINAL 1. Questionable Gastroparesis vs Ileus?: - emesis on the floor and again in ICU while intubating. PEG placed to suction and large volume of gastric secretions returned.  - NPO; keep PEG to low intermittent suction.   HEMATOLOGIC 1. Anemia: - Hgb 9.2 from baseline 9-10. - no clinical blood loss; continue to monitor.  INFECTIOUS 1. Severe Sepsis: -  since aspirating, patient is now febrile and has a lactate of 3.7 - trend lactate, check procal - pan-culture - start Vanc and Zosyn as above  ENDOCRINE No acute issues  NEUROLOGIC 1. Acute Encephalopathy; Multiple acute ischemic CVA's - on plavix - Neurology following - new worsened encephalopathy tonight likely related to his sepsis.    60 minutes nonprocedural critical care time  Vernie Murders, MD Pulmonary and [place] Pager: [phone]  10/29/2016, 2:35 AM

## 2016-10-29 NOTE — Progress Notes (Signed)
eLink Physician-Brief Progress Note Patient Name: JACARIE PATE DOB: 1950/07/12 MRN: [id number]   Date of Service  10/29/2016  HPI/Events of Note  Notified by nurse of recurrent hypotension despite transitioning off propofol for sedation.   eICU Interventions  Normal saline 1 L bolus now      Intervention Category Major Interventions: Hypotension - evaluation and management  Tera Partridge 10/29/2016, 6:38 AM

## 2016-10-29 NOTE — Progress Notes (Signed)
CRITICAL VALUE ALERT  Critical Value:  Lactic acid 434  Date & Time Notied:  10/29/16 0523  Provider Notified: Dr. Ashok Cordia  Orders Received/Actions taken: repeat labs

## 2016-10-29 NOTE — Significant Event (Signed)
Rapid Response Event Note  Overview: Called by bedside RN d/t patients sats low after patient vomited. Time Called: 2320 Arrival Time: 2322 Event Type: Respiratory  Initial Focused Assessment: Pt laying in bed, RR-40s, SpO2-77% on 6L [place].  Pt using accesorry muscles.  Lungs rhonchi t/o all lung fields.  Vomit on bed and patient.  Pt grunts to verbal stimuli, eyes open.   Interventions: Pt placed on 100% NRB with sats increasing to 81%. ABG, PCXR ordered.  Dr. Threasa Alpha paged and to bedside to assess patient.  Dr. Ashok Cordia consulted and pt moved to 2M06 and intubated.  Plan of Care (if not transferred):  Event Summary: Name of Physician Notified: Dr. Alyse Low at 2325  Name of Consulting Physician Notified: Dr. Ashok Cordia at 2335  Outcome: Transferred (Comment) [phone])  Event End Time: 0020  Dillard [place]

## 2016-10-29 NOTE — Progress Notes (Signed)
Went into to patients room, approximately 23:00 to administer medication.  As I was assessing patient, he began to cough, I sat him up, tried to orally suction him, difficult because he kept biting on the yankauer. As I was standing by the bed, patient regurgitated and I was able to suction approximately 40 cc of what looked like tube feeding. He appeared to be more comfortable, but started coughing. He leaned over and regurgitated a large amount, again, appearing to be tube feeding. At this point two NT were in the room.  I was attempting to get vital signs, and that is when we discovered he was at 76%. Put O2 on him, I called rapid response approximately 23:19, Respiratory came. I phoned Alyse Low, [place]. ICU bed was ordered. I informed my Camera operator of such. I phoned his mom and informed her what was going on. She called her son [name] and I was able to give him the information. Called Apolonio Schneiders, gave report, got update. Tubed chart and daily meds to station 21.

## 2016-10-29 NOTE — Progress Notes (Signed)
eLink Physician-Brief Progress Note Patient Name: JORAH HUA DOB: 01-03-1951 MRN: [id number]   Date of Service  10/29/2016  HPI/Events of Note  Patient now hypotensive. Camera check shows patient now adequately sedated. Just had second PIV placed. Serum cortisol pending.  eICU Interventions  Bolus 1 L NS now     Intervention Category Major Interventions: Hypotension - evaluation and management  Tera Partridge 10/29/2016, 3:36 AM

## 2016-10-29 NOTE — Progress Notes (Signed)
STROKE TEAM PROGRESS NOTE   SUBJECTIVE (INTERVAL HISTORY) No family at bedside. Pt had vomiting, aspiration and respiratory distress overnight and was intubated, transferred to ICU overnight. Currently intubated on sedation as well as pressors.   OBJECTIVE Temp:  [97.7 F (36.5 C)-103.1 F (39.5 C)] 98 F (36.7 C) (07/15 0814) Pulse Rate:  [44-151] 111 (07/15 0930) Cardiac Rhythm: Sinus tachycardia (07/15 0800) Resp:  [17-45] 20 (07/15 0930) BP: (53-179)/(40-154) 98/57 (07/15 0930) SpO2:  [81 %-100 %] 100 % (07/15 0930) FiO2 (%):  [60 %-100 %] 60 % (07/15 0800) Weight:  [165 lb 12.6 oz (75.2 kg)] 165 lb 12.6 oz (75.2 kg) (07/15 0500)  CBC:   Recent Labs Lab 10/27/16 0359 10/29/16 0126  WBC 19.8* 19.5*  NEUTROABS  --  17.1*  HGB 9.2* 9.2*  HCT 28.7* 29.1*  MCV 87.2 88.4  PLT 513* 700*    Basic Metabolic Panel:   Recent Labs Lab 10/27/16 0359 10/29/16 0126  NA 137 137  K 4.8 5.4*  CL 102 102  CO2 26 26  GLUCOSE 132* 108*  BUN 21* 30*  CREATININE 1.08 1.25*  CALCIUM 9.2 9.7  MG  --  2.2  PHOS  --  4.9*    Lipid Panel:     Component Value Date/Time   CHOL 92 10/21/2016 0253   TRIG 95 10/29/2016 0133   HDL 14 (L) 10/21/2016 0253   CHOLHDL 6.6 10/21/2016 0253   VLDL 21 10/21/2016 0253   LDLCALC 57 10/21/2016 0253   HgbA1c:  Lab Results  Component Value Date   HGBA1C 5.4 10/21/2016   Urine Drug Screen:     Component Value Date/Time   LABOPIA NONE DETECTED 10/19/2016 0050   COCAINSCRNUR NONE DETECTED 10/19/2016 0050   LABBENZ NONE DETECTED 10/19/2016 0050   AMPHETMU NONE DETECTED 10/19/2016 0050   THCU NONE DETECTED 10/19/2016 0050   LABBARB NONE DETECTED 10/19/2016 0050    Alcohol Level No results found for: [place]eses I have personally reviewed the radiological images below and agree with the radiology interpretations.  Ct Angio Head W Or Wo Contrast Ct Angio Neck W Or Wo Contrast 10/20/2016 1. No acute arterial finding.  2. Advanced left  and mild right V1 segment atheromatous stenosis. No visible ulcerated plaque.  3. Motion artifact from swallowing significantly degrades evaluation of the cervical carotids, especially on the left. No noted flow limiting stenosis.  4. Diminished number of left MCA branches in the setting of remote infarct.  5. No visible progression or hemorrhagic conversion of the acute posterior circulation infarcts.  6. Remote left MCA and ACA territory infarcts.   Dg Chest Port 1 View 10/19/2016 Increasing volume loss in the left hemithorax with progressive left lung opacities. Suspect aspiration/mucous plugging versus increasing pneumonia.   MRI brian - b/l cerebellar L>R, right pons, right midbrain, right PCA territory and left thalamus infarcts.   TTE  Left ventricle: The cavity size was normal. Wall thickness was   increased in a pattern of mild LVH. Systolic function was normal.   The estimated ejection fraction was in the range of 60% to 65%.   Wall motion was normal; there were no regional wall motion   abnormalities. - Atrial septum: The interatrial septum was hypermobile. A patent   foramen ovale cannot be excluded. 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 - Recommendations: Consider transesophageal echocardiography if   clinically indicated.  LE venous doppler pending   PHYSICAL EXAM  Temp:  [97.7 F (  36.5 C)-103.1 F (39.5 C)] 98 F (36.7 C) (07/15 0814) Pulse Rate:  [44-151] 111 (07/15 0930) Resp:  [17-45] 20 (07/15 0930) BP: (53-179)/(40-154) 98/57 (07/15 0930) SpO2:  [81 %-100 %] 100 % (07/15 0930) FiO2 (%):  [60 %-100 %] 60 % (07/15 0800) Weight:  [165 lb 12.6 oz (75.2 kg)] 165 lb 12.6 oz (75.2 kg) (07/15 0500)  General - thin, well developed, in no apparent distress.  Ophthalmologic - Fundi not visualized due to noncooperation.  Cardiovascular - Regular rate and rhythm.  Neuro - intubated on sedation and pressor. Briefly open eyes on voice and pain  stimulation. Not following commands, PERRL, against eye forced opening. Positive corneal and gag. Facial symmetry not able to assess. On pain stimulation, withdraw all extremities. DTR 1+ and no babinski. Sensation, coordination and gait not tested.   ASSESSMENT/PLAN [name] is a 66 y.o. male with history of previous strokes, hypertension and ongoing tobacco use presenting with severe dysarthria. He did not receive IV t-PA due to late presentation.  Stroke:  Extensive b/l posterior cerebral infarcts, embolic pattern, etiology unclear   Resultant  Severe dysarthria and dysphagia  CT head evolving bilateral cerebellar, right midbrain and left thalamic infarcts  MRI head - OSH - extensive multiple acute infarcts b/l cerebellar, right pons, right midbrain, right PCA and left thalamus  CTA H&N - Right V1segment origin stenosis due to atheromatous plaque.   2D Echo - EF 60% to 65%. ASA present, but PFO not able to exclude  Hold off TEE and loop recorder for now due to respiratory distress needing intubation  LE venous doppler pending  LDL - 24  HgbA1c - 5.4  VTE prophylaxis - heparin subq Diet - low sodium heart healthy Diet NPO time specified Except for: Other (See Comments) Diet NPO time specified  No antithrombotic prior to admission, now on aspirin 325 mg daily and plavix. Continue DAPT for 3 months and then plavix alone.   Patient counseled to be compliant with his antithrombotic medications  Ongoing aggressive stroke risk factor management  Therapy recommendations: SNF  Disposition:  Pending  Respiratory distress  Likely due to aspiration   CCM on board  Intubated on sedation  Sepsis with aspiration pneumonia  Low BP on pressor  On vanco and zosyn  CCM on board  Hyperlipidemia  Home meds: Mevacor 20 mg daily not resumed in hospital  LDL 24, goal < 70  On pravastatin 20mg  now  Continue statin at discharge  Dysphagia   Severe  dysarthria  Did not pass swallow  TF via PEG  Tobacco abuse  Current smoker  Smoking cessation counseling was provided  Pt is willing to quit  Other Stroke Risk Factors  Advanced age  ETOH use, advised to drink no more than 1 drink per day.  Hx stroke/TIA by imaging  Other Active Problems  Elevated creatinine  Hospital day # 10  This patient is critically ill due to extensive posterior stroke, dysphagia, aspiration and at significant risk of neurological worsening, death form sepsis, septic shock, recurrent stroke. This patient's care requires constant monitoring of vital signs, hemodynamics, respiratory and cardiac monitoring, review of multiple databases, neurological assessment, discussion with family, other specialists and medical decision making of high complexity. I spent 35 minutes of neurocritical care time in the care of this patient.  Rosalin Hawking, MD PhD Stroke Neurology 10/29/2016 10:06 AM   To contact Stroke Continuity provider, please refer to [email or web address] After hours, contact General Neurology

## 2016-10-29 NOTE — Progress Notes (Signed)
Attempted to call Hansel Devan, brother of patient at [phone] to give him room number where patient was transferred to and got no answer.

## 2016-10-30 ENCOUNTER — Inpatient Hospital Stay (HOSPITAL_COMMUNITY): Payer: Medicare HMO

## 2016-10-30 DIAGNOSIS — J9690 Respiratory failure, unspecified, unspecified whether with hypoxia or hypercapnia: Secondary | ICD-10-CM

## 2016-10-30 DIAGNOSIS — J9601 Acute respiratory failure with hypoxia: Secondary | ICD-10-CM

## 2016-10-30 DIAGNOSIS — R0602 Shortness of breath: Secondary | ICD-10-CM

## 2016-10-30 DIAGNOSIS — T17908A Unspecified foreign body in respiratory tract, part unspecified causing other injury, initial encounter: Secondary | ICD-10-CM

## 2016-10-30 DIAGNOSIS — I639 Cerebral infarction, unspecified: Secondary | ICD-10-CM

## 2016-10-30 LAB — CBC
HCT: 21.5 % — ABNORMAL LOW (ref 39.0–52.0)
Hemoglobin: 7.1 g/dL — ABNORMAL LOW (ref 13.0–17.0)
MCH: 28.3 pg (ref 26.0–34.0)
MCHC: 32.6 g/dL (ref 30.0–36.0)
MCV: 87 fL (ref 78.0–100.0)
Platelets: 589 10*3/uL — ABNORMAL HIGH (ref 150–400)
RBC: 2.47 MIL/uL — ABNORMAL LOW (ref 4.22–5.81)
RDW: 15.4 % (ref 11.5–15.5)
WBC: 29.1 10*3/uL — ABNORMAL HIGH (ref 4.0–10.5)

## 2016-10-30 LAB — GLUCOSE, CAPILLARY
Glucose-Capillary: 108 mg/dL — ABNORMAL HIGH (ref 65–99)
Glucose-Capillary: 112 mg/dL — ABNORMAL HIGH (ref 65–99)
Glucose-Capillary: 138 mg/dL — ABNORMAL HIGH (ref 65–99)
Glucose-Capillary: 142 mg/dL — ABNORMAL HIGH (ref 65–99)
Glucose-Capillary: 80 mg/dL (ref 65–99)
Glucose-Capillary: 91 mg/dL (ref 65–99)
Glucose-Capillary: 94 mg/dL (ref 65–99)
Glucose-Capillary: 99 mg/dL (ref 65–99)

## 2016-10-30 LAB — BASIC METABOLIC PANEL
Anion gap: 9 (ref 5–15)
BUN: 30 mg/dL — ABNORMAL HIGH (ref 6–20)
CO2: 22 mmol/L (ref 22–32)
Calcium: 8.8 mg/dL — ABNORMAL LOW (ref 8.9–10.3)
Chloride: 108 mmol/L (ref 101–111)
Creatinine, Ser: 1.83 mg/dL — ABNORMAL HIGH (ref 0.61–1.24)
GFR calc Af Amer: 43 mL/min — ABNORMAL LOW (ref >60)
GFR calc non Af Amer: 37 mL/min — ABNORMAL LOW (ref >60)
Glucose, Bld: 85 mg/dL (ref 65–99)
Potassium: 4.2 mmol/L (ref 3.5–5.1)
Sodium: 139 mmol/L (ref 135–145)

## 2016-10-30 LAB — PHOSPHORUS: Phosphorus: 3.7 mg/dL (ref 2.5–4.6)

## 2016-10-30 LAB — LACTIC ACID, PLASMA: Lactic Acid, Venous: 1.1 mmol/L (ref 0.5–1.9)

## 2016-10-30 LAB — MAGNESIUM: Magnesium: 1.9 mg/dL (ref 1.7–2.4)

## 2016-10-30 LAB — PROCALCITONIN: Procalcitonin: 125.92 ng/mL

## 2016-10-30 MED ORDER — FENTANYL CITRATE (PF) 100 MCG/2ML IJ SOLN
25.0000 ug | INTRAMUSCULAR | Status: DC | PRN
  Administered 2016-10-31 – 2016-11-01 (×5): 100 ug via INTRAVENOUS
  Filled 2016-10-30 (×6): qty 2

## 2016-10-30 MED ORDER — JEVITY 1.2 CAL PO LIQD
1000.0000 mL | ORAL | Status: DC
  Administered 2016-10-30: 1000 mL
  Filled 2016-10-30: qty 1000

## 2016-10-30 MED ORDER — LACTULOSE 10 GM/15ML PO SOLN
30.0000 g | Freq: Once | ORAL | Status: AC
  Administered 2016-10-30: 30 g
  Filled 2016-10-30: qty 45

## 2016-10-30 MED ORDER — FREE WATER
200.0000 mL | Freq: Four times a day (QID) | Status: DC
  Administered 2016-10-30 – 2016-11-07 (×33): 200 mL

## 2016-10-30 MED ORDER — SODIUM CHLORIDE 0.9 % IV SOLN
0.4000 ug/kg/h | INTRAVENOUS | Status: DC
  Administered 2016-10-30 (×2): 0.4 ug/kg/h via INTRAVENOUS
  Administered 2016-10-31 (×2): 0.5 ug/kg/h via INTRAVENOUS
  Administered 2016-10-31: 0.8 ug/kg/h via INTRAVENOUS
  Filled 2016-10-30 (×6): qty 2

## 2016-10-30 MED ORDER — VITAL AF 1.2 CAL PO LIQD
1000.0000 mL | ORAL | Status: DC
  Administered 2016-10-30: 1000 mL
  Filled 2016-10-30 (×2): qty 1000

## 2016-10-30 MED ORDER — FAMOTIDINE 40 MG/5ML PO SUSR
20.0000 mg | Freq: Two times a day (BID) | ORAL | Status: DC
  Administered 2016-10-30 – 2016-11-29 (×61): 20 mg
  Filled 2016-10-30 (×63): qty 2.5

## 2016-10-30 NOTE — Progress Notes (Signed)
PT Cancellation Note  Patient Details Name: Scott Rivera MRN: [id number] DOB: May 04, 1950   Cancelled Treatment:    Reason Eval/Treat Not Completed: Medical issues which prohibited therapy. Pt with medical decline and now intubated. Will continue to monitor for medical readiness for PT.   [place] 10/30/2016, 10:14 AM  Suanne Marker PT [phone]

## 2016-10-30 NOTE — Progress Notes (Signed)
OT Cancellation Note  Patient Details Name: Scott Rivera MRN: [id number] DOB: Apr 06, 1951   Cancelled Treatment:    Reason Eval/Treat Not Completed: Medical issues which prohibited therapy. Pt with medical decline and now intubated. Will follow.  Malka So 10/30/2016, 2:33 PM  [phone]

## 2016-10-30 NOTE — Progress Notes (Signed)
*  PRELIMINARY RESULTS* Vascular Ultrasound Lower extremity venous duplex has been completed.  Preliminary findings: No evidence of DVT in visualized veins or baker's cyst.   Landry Mellow, RDMS, RVT  10/30/2016, 9:47 AM

## 2016-10-30 NOTE — Progress Notes (Signed)
STROKE TEAM PROGRESS NOTE   SUBJECTIVE (INTERVAL HISTORY) Pt RN at bedside. Pt just off sedation, eyes open but not following commands. Not able to wean off vent due to tachypnea without vent. Moving all extremities on stimulation.    OBJECTIVE Temp:  [97.7 F (36.5 C)-99.3 F (37.4 C)] 98.9 F (37.2 C) (07/16 1136) Pulse Rate:  [79-119] 102 (07/16 1300) Cardiac Rhythm: Sinus tachycardia (07/16 0823) Resp:  [19-38] 30 (07/16 1300) BP: (74-129)/(44-68) 112/54 (07/16 1300) SpO2:  [95 %-100 %] 98 % (07/16 1300) FiO2 (%):  [30 %-60 %] 30 % (07/16 1200) Weight:  [171 lb 4.8 oz (77.7 kg)] 171 lb 4.8 oz (77.7 kg) (07/16 0414)  CBC:   Recent Labs Lab 10/29/16 0126 10/30/16 0419  WBC 19.5* 29.1*  NEUTROABS 17.1*  --   HGB 9.2* 7.1*  HCT 29.1* 21.5*  MCV 88.4 87.0  PLT 700* 589*    Basic Metabolic Panel:   Recent Labs Lab 10/29/16 1740 10/30/16 0419  NA 135 139  K 4.9 4.2  CL 105 108  CO2 22 22  GLUCOSE 164* 85  BUN 34* 30*  CREATININE 2.02* 1.83*  CALCIUM 8.6* 8.8*  MG 1.9 1.9  PHOS 4.0 3.7    Lipid Panel:     Component Value Date/Time   CHOL 92 10/21/2016 0253   TRIG 95 10/29/2016 0133   HDL 14 (L) 10/21/2016 0253   CHOLHDL 6.6 10/21/2016 0253   VLDL 21 10/21/2016 0253   LDLCALC 57 10/21/2016 0253   HgbA1c:  Lab Results  Component Value Date   HGBA1C 5.4 10/21/2016   Urine Drug Screen:     Component Value Date/Time   LABOPIA NONE DETECTED 10/19/2016 0050   COCAINSCRNUR NONE DETECTED 10/19/2016 0050   LABBENZ NONE DETECTED 10/19/2016 0050   AMPHETMU NONE DETECTED 10/19/2016 0050   THCU NONE DETECTED 10/19/2016 0050   LABBARB NONE DETECTED 10/19/2016 0050    Alcohol Level No results found for: Anthony I have personally reviewed the radiological images below and agree with the radiology interpretations.  Ct Angio Head W Or Wo Contrast Ct Angio Neck W Or Wo Contrast 10/20/2016 1. No acute arterial finding.  2. Advanced left and mild right V1  segment atheromatous stenosis. No visible ulcerated plaque.  3. Motion artifact from swallowing significantly degrades evaluation of the cervical carotids, especially on the left. No noted flow limiting stenosis.  4. Diminished number of left MCA branches in the setting of remote infarct.  5. No visible progression or hemorrhagic conversion of the acute posterior circulation infarcts.  6. Remote left MCA and ACA territory infarcts.   Dg Chest Port 1 View 10/19/2016 Increasing volume loss in the left hemithorax with progressive left lung opacities. Suspect aspiration/mucous plugging versus increasing pneumonia.   MRI brian - b/l cerebellar L>R, right pons, right midbrain, right PCA territory and left thalamus infarcts.   TTE  Left ventricle: The cavity size was normal. Wall thickness was   increased in a pattern of mild LVH. Systolic function was normal.   The estimated ejection fraction was in the range of 60% to 65%.   Wall motion was normal; there were no regional wall motion   abnormalities. - Atrial septum: The interatrial septum was hypermobile. A patent   foramen ovale cannot be excluded. 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 - Recommendations: Consider transesophageal echocardiography if   clinically indicated.  LE venous doppler  - No evidence of deep vein thrombosis involving the  visualized   veins of the right lower extremity and left lower extremity. - No evidence of Baker&'s cyst on the right or left.   PHYSICAL EXAM  Temp:  [97.7 F (36.5 C)-99.3 F (37.4 C)] 98.9 F (37.2 C) (07/16 1136) Pulse Rate:  [79-119] 102 (07/16 1300) Resp:  [19-38] 30 (07/16 1300) BP: (74-129)/(44-68) 112/54 (07/16 1300) SpO2:  [95 %-100 %] 98 % (07/16 1300) FiO2 (%):  [30 %-60 %] 30 % (07/16 1200) Weight:  [171 lb 4.8 oz (77.7 kg)] 171 lb 4.8 oz (77.7 kg) (07/16 0414)  General - thin, well developed, intubated just off sedation  Ophthalmologic - Fundi not visualized due  to noncooperation.  Cardiovascular - Regular rate and rhythm.  Neuro - intubated just off sedation and off pressor. Briefly open eyes on voice and pain stimulation. Not following commands, PERRL, not bleeding to visual threat bilaterally. positive corneal and gag. Facial symmetry not able to assess due to ET tube. On pain stimulation, withdraw all extremities symmetrically. DTR 1+ and no babinski. Sensation, coordination and gait not tested.   ASSESSMENT/PLAN [name] is a 66 y.o. male with history of previous strokes, hypertension and ongoing tobacco use presenting with severe dysarthria. He did not receive IV t-PA due to late presentation.  Stroke:  Extensive b/l posterior cerebral infarcts, embolic pattern, etiology unclear   Resultant  Severe dysarthria and dysphagia  CT head evolving bilateral cerebellar, right midbrain and left thalamic infarcts  MRI head - OSH - extensive multiple acute infarcts b/l cerebellar, right pons, right midbrain, right PCA and left thalamus  CTA H&N - Right V1segment origin stenosis due to atheromatous plaque.   2D Echo - EF 60% to 65%. ASA present, but PFO not able to exclude  Hold off TEE and loop recorder for now due to respiratory distress needing intubation  LE venous doppler no DVT  LDL - 24  HgbA1c - 5.4  VTE prophylaxis - heparin subq Diet - low sodium heart healthy Diet NPO time specified  No antithrombotic prior to admission, now on aspirin 325 mg daily and plavix. Continue DAPT for 3 months and then plavix alone.   Patient counseled to be compliant with his antithrombotic medications  Ongoing aggressive stroke risk factor management  Therapy recommendations: SNF  Disposition:  Pending  Respiratory distress  Likely due to aspiration   CCM on board  Intubated on vent  Sepsis with aspiration pneumonia  off pressor, BP more stable  On vanco and zosyn  CCM on board  Leukocytosis 19.5 ->  29.1  Hyperlipidemia  Home meds: Mevacor 20 mg daily not resumed in hospital  LDL 24, goal < 70  On pravastatin 20mg  now  Continue statin at discharge  Dysphagia   Severe dysarthria  Did not pass swallow  TF via PEG  Tobacco abuse  Current smoker  Smoking cessation counseling was provided  Pt is willing to quit  Other Stroke Risk Factors  Advanced age  ETOH use, advised to drink no more than 1 drink per day.  Hx stroke/TIA by imaging  Other Active Problems  Elevated creatinine 2.02->1.83  Hospital day # 11  This patient is critically ill due to extensive posterior stroke, dysphagia, aspiration and at significant risk of neurological worsening, death form sepsis, septic shock, recurrent stroke. This patient's care requires constant monitoring of vital signs, hemodynamics, respiratory and cardiac monitoring, review of multiple databases, neurological assessment, discussion with family, other specialists and medical decision making of high complexity. I  spent 35 minutes of neurocritical care time in the care of this patient.  Rosalin Hawking, MD PhD Stroke Neurology 10/30/2016 1:10 PM   To contact Stroke Continuity provider, please refer to [email or web address] After hours, contact General Neurology

## 2016-10-30 NOTE — Progress Notes (Signed)
PULMONARY / CRITICAL CARE MEDICINE   Name: Scott Rivera MRN: [id number] DOB: 02/06/1951    ADMISSION DATE:  10/19/2016 CONSULTATION DATE:10/29/16  CHIEF COMPLAINT:Respiratory distress  HISTORY OF PRESENT ILLNESS: 66yoM with HTN, CKD, Anemia, [facility] a community hospital on 7/[facility] with confusion after being found down on the floor. MRI brain on admission showed multiple acute infarcts within both hemispheres of cerebellum and right occipital lobe right midbrain and left thalamus. CXR at that time also showed multifocal pneumonia. He initially had rhabdomyolysis and AKI, both of which resolved. Family then requested transfer to [facility] on 10/19/16. PEG was placed on 10/27/16. He completed a 7 day course of Cefepime then transitioned to Augmentin. Then overnight patient aspirated and developed respiratory distress with RR in 40's with worsening acute hypoxic respiratory failure, precipitating emergent transfer to ICU for intubation. Intubation complicated by large emesis with aspiration during the procedure. PEG placed to suction and large volume of gastric secretions removed.   SUBJECTIVE:  Remains intubated Persistent hypotension event after several fluid bolus.   VITAL SIGNS: BP (!) 103/51   Pulse (!) 113   Temp 99.3 F (37.4 C) (Oral)   Resp (!) 24   Ht 5\' 11"  (1.803 m)   Wt 171 lb 4.8 oz (77.7 kg)   SpO2 100%   BMI 23.89 kg/m   HEMODYNAMICS:    VENTILATOR SETTINGS: Vent Mode: PRVC FiO2 (%):  [40 %-60 %] 40 % Set Rate:  [20 bmp] 20 bmp Vt Set:  [600 mL] 600 mL PEEP:  [5 cmH20-10 cmH20] 5 cmH20 Plateau Pressure:  [23 cmH20-26 cmH20] 24 cmH20  INTAKE / OUTPUT: I/O last 3 completed shifts: In: 2343.2 [I.V.:1683.2; NG/GT:260; IV Piggyback:400] Out: 3580 [Urine:2280; Drains:1300]  PHYSICAL EXAMINATION: Gen: 66yo M appearing comfortably sedated and in NAD HEENT: pinpoint pupils, PERRL, MMM Lungs: CTABL CV: RRR, no MRG Abd: + BS, NTND Ext: no edema Skin:  warm and dry, no rashes Neuro: Sedated, unresponsive  LABS:  BMET  Recent Labs Lab 10/29/16 0126 10/29/16 1740 10/30/16 0419  NA 137 135 139  K 5.4* 4.9 4.2  CL 102 105 108  CO2 26 22 22   BUN 30* 34* 30*  CREATININE 1.25* 2.02* 1.83*  GLUCOSE 108* 164* 85    Electrolytes  Recent Labs Lab 10/29/16 0126 10/29/16 1740 10/30/16 0419  CALCIUM 9.7 8.6* 8.8*  MG 2.2 1.9 1.9  PHOS 4.9* 4.0 3.7    CBC  Recent Labs Lab 10/27/16 0359 10/29/16 0126 10/30/16 0419  WBC 19.8* 19.5* 29.1*  HGB 9.2* 9.2* 7.1*  HCT 28.7* 29.1* 21.5*  PLT 513* 700* 589*    Coag's  Recent Labs Lab 10/29/16 0126  INR 1.11    Sepsis Markers  Recent Labs Lab 10/29/16 0126  10/29/16 0423 10/29/16 1739 10/30/16 0030 10/30/16 0419  LATICACIDVEN  --   < > 4.4* 2.3* 1.1  --   PROCALCITON 5.93  --   --   --   --  125.92  < > = values in this interval not displayed.  ABG  Recent Labs Lab 10/29/16 0047 10/29/16 0905  PHART 7.347* 7.422  PCO2ART 55.1* 33.8  PO2ART 306.0* 88.0    Liver Enzymes  Recent Labs Lab 10/25/16 0505 10/27/16 0359 10/29/16 0126  AST 50* 63* 43*  ALT 55 84* 58  ALKPHOS 133* 214* 186*  BILITOT 0.5 0.8 0.8  ALBUMIN 1.8* 1.9* 1.8*    Cardiac Enzymes  Recent Labs Lab 10/29/16 0126  TROPONINI <0.03  Glucose  Recent Labs Lab 10/29/16 0813 10/29/16 1212 10/29/16 1544 10/29/16 1959 10/30/16 0016 10/30/16 0342  GLUCAP 83 138* 171* 291* 80 112*    Imaging Dg Abd 1 View  Result Date: 10/29/2016 CLINICAL DATA:  Ileus EXAM: ABDOMEN - 1 VIEW COMPARISON:  CT 10/26/2016 FINDINGS: Gastrostomy tube projects over the stomach. Oral contrast material seen throughout the colon with moderate stool burden. No evidence of bowel obstruction, organomegaly or free air. IMPRESSION: Contrast throughout the colon. No evidence of bowel obstruction. Moderate stool burden. Electronically Signed   By: Rolm Baptise M.D.   On: 10/29/2016 10:48   Dg Chest Port 1  View  Result Date: 10/29/2016 CLINICAL DATA:  Status post central line placed EXAM: PORTABLE CHEST 1 VIEW COMPARISON:  01/29/2017 FINDINGS: Endotracheal tube is again noted. Cardiac shadow is stable. New right-sided central venous line is noted at cavoatrial junction. No pneumothorax is seen. Persistent right basilar infiltrate is noted. No bony abnormality is noted. IMPRESSION: Stable right basilar infiltrate. No pneumothorax following central line placement. Electronically Signed   By: Inez Catalina M.D.   On: 10/29/2016 16:33     STUDIES:   CULTURES: Bcx 7/15 > Resp cx 7/15 >  ANTIBIOTICS: Cefepime 7/1-7/6 Augmentin 7/12-7/15 Zosyn 7/15> Vanco 7/15>  SIGNIFICANT EVENTS: 7/1 Admit to [place] with stroke 7/5- transfer to [facility] 7/15- Aspirated, intubated 7/15: central line placed  LINES/TUBES: PEG 7/13> ETT 7/15 > CVC triple lumen R subclavian 7/15>>  DISCUSSION: [id number] with HTN, CKD, Anemia, brought initially to a community hospital on 7/1 with confusion after being found down on the floor. Found to have multiple ischemic CVA's as well as multifocal pneumonia. Initially had AKI and Rhabdo as well, now resolved. PEG was placed on 10/27/16. He completed a 7 day course of Cefepime then transitioned to Augmentin. 7/15 patient aspirated and developed severe respiratory distress requiring intubation and started on vanc/zosyn and with significant leukocytosis to 29 from 19.5.    ASSESSMENT / PLAN:  PULMONARY A: Acute resp failure Reccurent aspiration P:   Wean vent, follow with CXR and ABGs  CARDIOVASCULAR A:  Septic shock Elevated LA (2.3>1.1) Atrial septal aneurysm. ? PFO> TEE when stable P:  Continue IVF Levophed to maintain MAP > 65, currently off Follow LA TEE when stable prevastatin 20mg   RENAL A:   AKI, improving P:   Follow urine output and Cr  GASTROINTESTINAL A:   Possible gastroparesis, ileus causing aspiraton P:   Start trickle tube feeds  via PEG Lactulose 30mg  once, follow bowel movements, continue colace Continue pepcid  HEMATOLOGIC A:   Worsening leukocytosis 19.5>29 P:  Follow CBC  INFECTIOUS A:   Severe sepsis secondary to aspiration P:   Vanc/zosyn Cultures pending Follow Pct (5.9>125.9)   ENDOCRINE A:   No acute issues P:   Monitor CBGs  NEUROLOGIC A:   Multiple CVA, intubated and sedated P:   Propofol drip PRN agitation Permissive HTN, hydralazine PRN Neuro following; appreciate assistance - hold off on TEE; loop recorder while intubated -DAPT for 48mo then plavix alone On plavix LE venous doppler normal   FAMILY  - Updates: No family at bedside - Inter-disciplinary family meet or Palliative Care meeting due by:  7/22   Pulmonary and [place] Pager: [phone]  10/30/2016, 7:00 AM

## 2016-10-30 NOTE — Progress Notes (Signed)
Wasted 230 cc of fentanyl in sink with phyllis mckinney. Bartholomew Crews, RN 10/30/2016 12:45 PM

## 2016-10-30 NOTE — Progress Notes (Signed)
Nutrition Follow-up  DOCUMENTATION CODES:   Severe malnutrition in context of acute illness/injury  INTERVENTION:   Resume TF:  Vital AF 1.2 at 20 ml/h   As able, increase to goal rate of 65 ml/h (1560 ml per day)  At goal rate provides 1872 kcal, 117 gm protein, 1265 ml free water daily  NUTRITION DIAGNOSIS:   Malnutrition related to acute illness as evidenced by severe depletion of body fat, severe depletion of muscle mass, energy intake < or equal to 50% for > or equal to 5 days.  Ongoing  GOAL:   Patient will meet greater than or equal to 90% of their needs  Unmet  MONITOR:   Labs, Weight trends, TF tolerance, Skin, I & O's  ASSESSMENT:   66 yo male with PMH of HTN, stroke who was admitted to [facility] on 7/1 after family found him on the floor confused. MRI showed multiple acute infarcts. He was transferred to [facility] on 7/5 at family's request.   Discussed patient in ICU rounds and with RN today. Patient aspirated and developed severe respiratory distress overnight requiring intubation. TF was held for intubation. TF to be resumed at 20 ml/h today. Patient is currently intubated on ventilator support MV: 10.4 L/min Temp (24hrs), Avg:98.8 F (37.1 C), Min:97.7 F (36.5 C), Max:99.3 F (37.4 C)  Labs reviewed. CBG's: [phone] Medications reviewed and include MVI and thiamine.  Diet Order:  Diet - low sodium heart healthy Diet NPO time specified  Skin:  Wound (see comment) (stage II to R buttocks, L shoulder; DTI to heels)  Last BM:  7/5  Height:   Ht Readings from Last 1 Encounters:  10/19/16 5\' 11"  (1.803 m)    Weight:   Wt Readings from Last 1 Encounters:  10/30/16 171 lb 4.8 oz (77.7 kg)    Ideal Body Weight:  78.2 kg  BMI:  Body mass index is 23.89 kg/m.  Estimated Nutritional Needs:   Kcal:  1876  Protein:  110-125 gm  Fluid:  > 2 L  EDUCATION NEEDS:   No education needs identified at this time  Molli Barrows, [place], [place],  [place] Pager [phone] After Hours Pager [phone]

## 2016-10-30 NOTE — Progress Notes (Signed)
   10/30/16 1100  Clinical Encounter Type  Visited With Patient  Visit Type Other (Comment) ([place] consult)  Spiritual Encounters  Spiritual Needs Emotional  Stress Factors  Patient Stress Factors Health changes  Pt intubated and not competent to complete AD documentation.

## 2016-10-31 ENCOUNTER — Inpatient Hospital Stay (HOSPITAL_COMMUNITY): Payer: Medicare HMO

## 2016-10-31 DIAGNOSIS — T17908A Unspecified foreign body in respiratory tract, part unspecified causing other injury, initial encounter: Secondary | ICD-10-CM

## 2016-10-31 LAB — CBC
HCT: 20.5 % — ABNORMAL LOW (ref 39.0–52.0)
Hemoglobin: 6.6 g/dL — CL (ref 13.0–17.0)
MCH: 28.3 pg (ref 26.0–34.0)
MCHC: 32.2 g/dL (ref 30.0–36.0)
MCV: 88 fL (ref 78.0–100.0)
Platelets: 546 10*3/uL — ABNORMAL HIGH (ref 150–400)
RBC: 2.33 MIL/uL — ABNORMAL LOW (ref 4.22–5.81)
RDW: 15.8 % — ABNORMAL HIGH (ref 11.5–15.5)
WBC: 32.4 10*3/uL — ABNORMAL HIGH (ref 4.0–10.5)

## 2016-10-31 LAB — GLUCOSE, CAPILLARY
Glucose-Capillary: 99 mg/dL (ref 65–99)
Glucose-Capillary: 127 mg/dL — ABNORMAL HIGH (ref 65–99)
Glucose-Capillary: 127 mg/dL — ABNORMAL HIGH (ref 65–99)
Glucose-Capillary: 106 mg/dL — ABNORMAL HIGH (ref 65–99)
Glucose-Capillary: 129 mg/dL — ABNORMAL HIGH (ref 65–99)

## 2016-10-31 LAB — BASIC METABOLIC PANEL
Anion gap: 8 (ref 5–15)
BUN: 24 mg/dL — ABNORMAL HIGH (ref 6–20)
CO2: 20 mmol/L — ABNORMAL LOW (ref 22–32)
Calcium: 9 mg/dL (ref 8.9–10.3)
Chloride: 109 mmol/L (ref 101–111)
Creatinine, Ser: 1.69 mg/dL — ABNORMAL HIGH (ref 0.61–1.24)
GFR calc Af Amer: 47 mL/min — ABNORMAL LOW (ref >60)
GFR calc non Af Amer: 41 mL/min — ABNORMAL LOW (ref >60)
Glucose, Bld: 134 mg/dL — ABNORMAL HIGH (ref 65–99)
Potassium: 3.9 mmol/L (ref 3.5–5.1)
Sodium: 137 mmol/L (ref 135–145)

## 2016-10-31 LAB — PREPARE RBC (CROSSMATCH)

## 2016-10-31 LAB — HEMOGLOBIN AND HEMATOCRIT, BLOOD
HCT: 24.4 % — ABNORMAL LOW (ref 39.0–52.0)
Hemoglobin: 8.3 g/dL — ABNORMAL LOW (ref 13.0–17.0)

## 2016-10-31 LAB — CULTURE, RESPIRATORY W GRAM STAIN: Culture: NORMAL

## 2016-10-31 LAB — TRIGLYCERIDES: Triglycerides: 172 mg/dL — ABNORMAL HIGH (ref <150)

## 2016-10-31 LAB — PROCALCITONIN: Procalcitonin: 83.34 ng/mL

## 2016-10-31 LAB — ABO/RH: ABO/RH(D): O POS

## 2016-10-31 MED ORDER — PROPOFOL 1000 MG/100ML IV EMUL
0.0000 ug/kg/min | INTRAVENOUS | Status: DC
  Administered 2016-10-31: 15 ug/kg/min via INTRAVENOUS
  Administered 2016-11-01: 20 ug/kg/min via INTRAVENOUS
  Filled 2016-10-31 (×2): qty 100

## 2016-10-31 MED ORDER — ATROPINE SULFATE 1 MG/10ML IJ SOSY
PREFILLED_SYRINGE | INTRAMUSCULAR | Status: AC
  Filled 2016-10-31: qty 10

## 2016-10-31 MED ORDER — LACTULOSE 10 GM/15ML PO SOLN
30.0000 g | Freq: Once | ORAL | Status: AC
  Administered 2016-10-31: 30 g
  Filled 2016-10-31: qty 45

## 2016-10-31 MED ORDER — DEXMEDETOMIDINE HCL IN NACL 400 MCG/100ML IV SOLN
0.4000 ug/kg/h | INTRAVENOUS | Status: DC
  Administered 2016-10-31: 1.2 ug/kg/h via INTRAVENOUS
  Filled 2016-10-31 (×2): qty 100

## 2016-10-31 MED ORDER — SODIUM CHLORIDE 0.9 % IV SOLN
Freq: Once | INTRAVENOUS | Status: AC
  Administered 2016-10-31: 05:00:00 via INTRAVENOUS

## 2016-10-31 MED ORDER — BISACODYL 10 MG RE SUPP
10.0000 mg | Freq: Once | RECTAL | Status: AC
  Administered 2016-10-31: 10 mg via RECTAL
  Filled 2016-10-31: qty 1

## 2016-10-31 MED ORDER — VITAL AF 1.2 CAL PO LIQD
1000.0000 mL | ORAL | Status: DC
Start: 2016-10-31 — End: 2016-11-02
  Administered 2016-10-31 – 2016-11-02 (×2): 1000 mL

## 2016-10-31 NOTE — Progress Notes (Addendum)
    [facility] has been requested to perform a transesophageal echocardiogram on Scott Rivera for cerebrovascular accident.  After careful review of history and examination, the risks and benefits of transesophageal echocardiogram have been explained including risks of esophageal damage, perforation (1:10,000 risk), bleeding, pharyngeal hematoma as well as other potential complications associated with conscious sedation including aspiration, arrhythmia, respiratory failure and death. Alternatives to treatment were discussed, questions were answered.  Bedside TEE scheduled for 11/01/2016.  The patient is currently intubated and unable to give consent for the procedure. I contacted [name] and obtained verbal consent. Verified with patient's nurse. Signed consent form in patient's chart.    Erma Heritage, PA-C  10/31/2016 3:16 PM

## 2016-10-31 NOTE — Progress Notes (Signed)
CRITICAL VALUE ALERT  Critical Value:  hgb 6.6  Date & Time Notied:  10/31/2016 02:27  Provider Notified: Dr. Ashok Cordia

## 2016-10-31 NOTE — Progress Notes (Signed)
STROKE TEAM PROGRESS NOTE   SUBJECTIVE (INTERVAL HISTORY) Pt RN at bedside. Pt on precedex, but still not tolerating weaning. More awake alert but also more agitated and not following commands.    OBJECTIVE Temp:  [97.7 F (36.5 C)-99.2 F (37.3 C)] 97.7 F (36.5 C) (07/17 1224) Pulse Rate:  [62-102] 66 (07/17 1230) Cardiac Rhythm: Normal sinus rhythm (07/17 1200) Resp:  [14-27] 25 (07/17 1230) BP: (87-163)/(48-114) 132/81 (07/17 1230) SpO2:  [97 %-100 %] 100 % (07/17 1230) FiO2 (%):  [30 %] 30 % (07/17 1200) Weight:  [170 lb 6.7 oz (77.3 kg)] 170 lb 6.7 oz (77.3 kg) (07/17 0500)  CBC:   Recent Labs Lab 10/29/16 0126 10/30/16 0419 10/31/16 0150 10/31/16 0721  WBC 19.5* 29.1* 32.4*  --   NEUTROABS 17.1*  --   --   --   HGB 9.2* 7.1* 6.6* 8.3*  HCT 29.1* 21.5* 20.5* 24.4*  MCV 88.4 87.0 88.0  --   PLT 700* 589* 546*  --     Basic Metabolic Panel:   Recent Labs Lab 10/29/16 1740 10/30/16 0419 10/31/16 0150  NA 135 139 137  K 4.9 4.2 3.9  CL 105 108 109  CO2 22 22 20*  GLUCOSE 164* 85 134*  BUN 34* 30* 24*  CREATININE 2.02* 1.83* 1.69*  CALCIUM 8.6* 8.8* 9.0  MG 1.9 1.9  --   PHOS 4.0 3.7  --     Lipid Panel:     Component Value Date/Time   CHOL 92 10/21/2016 0253   TRIG 95 10/29/2016 0133   HDL 14 (L) 10/21/2016 0253   CHOLHDL 6.6 10/21/2016 0253   VLDL 21 10/21/2016 0253   LDLCALC 57 10/21/2016 0253   HgbA1c:  Lab Results  Component Value Date   HGBA1C 5.4 10/21/2016   Urine Drug Screen:     Component Value Date/Time   LABOPIA NONE DETECTED 10/19/2016 0050   COCAINSCRNUR NONE DETECTED 10/19/2016 0050   LABBENZ NONE DETECTED 10/19/2016 0050   AMPHETMU NONE DETECTED 10/19/2016 0050   THCU NONE DETECTED 10/19/2016 0050   LABBARB NONE DETECTED 10/19/2016 0050    Alcohol Level No results found for: [place] I have personally reviewed the radiological images below and agree with the radiology interpretations.  Ct Angio Head W Or Wo  Contrast Ct Angio Neck W Or Wo Contrast 10/20/2016 1. No acute arterial finding.  2. Advanced left and mild right V1 segment atheromatous stenosis. No visible ulcerated plaque.  3. Motion artifact from swallowing significantly degrades evaluation of the cervical carotids, especially on the left. No noted flow limiting stenosis.  4. Diminished number of left MCA branches in the setting of remote infarct.  5. No visible progression or hemorrhagic conversion of the acute posterior circulation infarcts.  6. Remote left MCA and ACA territory infarcts.   Dg Chest Port 1 View 10/19/2016 Increasing volume loss in the left hemithorax with progressive left lung opacities. Suspect aspiration/mucous plugging versus increasing pneumonia.   MRI brian - b/l cerebellar L>R, right pons, right midbrain, right PCA territory and left thalamus infarcts.   TTE  Left ventricle: The cavity size was normal. Wall thickness was   increased in a pattern of mild LVH. Systolic function was normal.   The estimated ejection fraction was in the range of 60% to 65%.   Wall motion was normal; there were no regional wall motion   abnormalities. - Atrial septum: The interatrial septum was hypermobile. A patent   foramen ovale cannot  be excluded. 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 - Recommendations: Consider transesophageal echocardiography if   clinically indicated.  LE venous doppler  - No evidence of deep vein thrombosis involving the visualized   veins of the right lower extremity and left lower extremity. - No evidence of Baker&'s cyst on the right or left.   PHYSICAL EXAM  Temp:  [97.7 F (36.5 C)-99.2 F (37.3 C)] 97.7 F (36.5 C) (07/17 1224) Pulse Rate:  [62-102] 66 (07/17 1230) Resp:  [14-27] 25 (07/17 1230) BP: (87-163)/(48-114) 132/81 (07/17 1230) SpO2:  [97 %-100 %] 100 % (07/17 1230) FiO2 (%):  [30 %] 30 % (07/17 1200) Weight:  [170 lb 6.7 oz (77.3 kg)] 170 lb 6.7 oz (77.3 kg) (07/17  0500)  General - thin, well developed, intubated on precedex  Ophthalmologic - Fundi not visualized due to noncooperation.  Cardiovascular - Regular rate and rhythm.  Neuro - intubated on precedex. Eyes open, more awake alert but also more agitated, trying to sit up from bed. Not following commands, PERRL, blinking to visual threat bilaterally, tracking objects, attending to both sides, eyes moving to both directions. positive corneal and gag. Facial symmetry not able to assess due to ET tube. BUE spontaneous movement, on pain stimulation, BLE mild withdraw symmetrically. DTR 1+ and no babinski. Sensation, coordination and gait not tested.   ASSESSMENT/PLAN Scott Rivera is a 66 y.o. male with history of previous strokes, hypertension and ongoing tobacco use presenting with severe dysarthria. He did not receive IV t-PA due to late presentation.  Stroke:  Extensive b/l posterior cerebral infarcts, embolic pattern, etiology unclear   Resultant  Severe dysarthria and dysphagia  CT head evolving bilateral cerebellar, right midbrain and left thalamic infarcts  MRI head - OSH - extensive multiple acute infarcts b/l cerebellar, right pons, right midbrain, right PCA and left thalamus  CTA H&N - Right V1segment origin stenosis due to atheromatous plaque.   2D Echo - EF 60% to 65%. ASA present, but PFO not able to exclude  Hold off TEE and loop recorder for now due to respiratory distress needing intubation  LE venous doppler no DVT  LDL - 24  HgbA1c - 5.4  VTE prophylaxis - heparin subq Diet - low sodium heart healthy Diet NPO time specified  No antithrombotic prior to admission, now on aspirin 325 mg daily and plavix. Continue DAPT for 3 months and then plavix alone.   Patient counseled to be compliant with his antithrombotic medications  Ongoing aggressive stroke risk factor management  Therapy recommendations: SNF  Disposition:  Pending  Respiratory distress  Likely  due to aspiration   CCM on board  Intubated on vent  Not tolerating weaning trials so far  May need to consider trach down the road  Sepsis with aspiration pneumonia  off pressor, BP stable  Off vanco and on zosyn  CCM on board  Leukocytosis 19.5 -> 29.1->32.4  Anemia   Hb 9.2->7.1->6.6->trasfusion->8.3  CBC monitoring  Hyperlipidemia  Home meds: Mevacor 20 mg daily not resumed in hospital  LDL 24, goal < 70  On pravastatin 20mg  now  Continue statin at discharge  Dysphagia   Severe dysarthria  Did not pass swallow  S/p PEG  TF via PEG  Other Stroke Risk Factors  Advanced age  Tobacco abuse - cessation counsueling provided  ETOH use, advised to drink no more than 1 drink per day.  Hx stroke/TIA by imaging  Other Active Problems  Elevated creatinine  2.02->1.83->1.69  Hospital day # 12  This patient is critically ill due to extensive posterior stroke, dysphagia, aspiration and at significant risk of neurological worsening, death form sepsis, septic shock, recurrent stroke. This patient's care requires constant monitoring of vital signs, hemodynamics, respiratory and cardiac monitoring, review of multiple databases, neurological assessment, discussion with family, other specialists and medical decision making of high complexity. I spent 35 minutes of neurocritical care time in the care of this patient.  Rosalin Hawking, MD PhD Stroke Neurology 10/31/2016 1:38 PM   To contact Stroke Continuity provider, please refer to [email or web address] After hours, contact General Neurology

## 2016-10-31 NOTE — Progress Notes (Signed)
PULMONARY / CRITICAL CARE MEDICINE   Name: Scott Rivera MRN: [id number] DOB: May 23, 1950    ADMISSION DATE:10/19/2016 CONSULTATION DATE:10/29/16  CHIEF COMPLAINT:Respiratory distress  HISTORY OF PRESENT ILLNESS: 66yoM with HTN, CKD, Anemia, brought initially to a community hospital on 7/1 with confusion after being found down on the floor. MRI brain on admission showed multiple acute infarcts within both hemispheres of cerebellum and right occipital lobe right midbrain and left thalamus. CXR at that time also showed multifocal pneumonia. He initially had rhabdomyolysis and AKI, both of which resolved. Family then requested transfer to [facility] on 10/19/16. PEG was placed on 10/27/16. He completed a 7 day course of Cefepime then transitioned to Augmentin. Then overnight patient aspirated and developed respiratory distress with RR in 40's with worsening acute hypoxic respiratory failure, precipitating emergent transfer to ICU for intubation. Intubation complicated by large emesis with aspiration during the procedure. PEG placed to suction and large volume of gastric secretions removed.   SUBJECTIVE: Remains intubated, sedated with precedex. Hgb down to 6.6 from 7.1, receiving 1U this AM.  VITAL SIGNS: BP 131/73   Pulse 62   Temp 99.2 F (37.3 C) (Oral)   Resp 20   Ht 5\' 11"  (1.803 m)   Wt 170 lb 6.7 oz (77.3 kg)   SpO2 100%   BMI 23.77 kg/m   HEMODYNAMICS:    VENTILATOR SETTINGS: Vent Mode: PRVC FiO2 (%):  [30 %-40 %] 30 % Set Rate:  [20 bmp] 20 bmp Vt Set:  [600 mL] 600 mL PEEP:  [5 cmH20] 5 cmH20 Pressure Support:  [10 cmH20-12 cmH20] 10 cmH20 Plateau Pressure:  [22 cmH20-23 cmH20] 23 cmH20  INTAKE / OUTPUT: I/O last 3 completed shifts: In: 6357.8 [P.O.:150; I.V.:4422.8; Other:430; NG/GT:505; IV Piggyback:850] Out: 8546 [Urine:3220; Drains:1200]  PHYSICAL EXAMINATION: Gen: 66yo M appearing comfortably sedated and in NAD HEENT: PERRL, MMM Lungs: CTABL CV: RRR, no  MRG Abd: + BS, NTND Ext:no edema Skin: warm and dry, no rashes Neuro: sedated, opens eyes to noxious stimuli, but does not follow commands  LABS:  BMET  Recent Labs Lab 10/29/16 1740 10/30/16 0419 10/31/16 0150  NA 135 139 137  K 4.9 4.2 3.9  CL 105 108 109  CO2 22 22 20*  BUN 34* 30* 24*  CREATININE 2.02* 1.83* 1.69*  GLUCOSE 164* 85 134*    Electrolytes  Recent Labs Lab 10/29/16 0126 10/29/16 1740 10/30/16 0419 10/31/16 0150  CALCIUM 9.7 8.6* 8.8* 9.0  MG 2.2 1.9 1.9  --   PHOS 4.9* 4.0 3.7  --     CBC  Recent Labs Lab 10/29/16 0126 10/30/16 0419 10/31/16 0150  WBC 19.5* 29.1* 32.4*  HGB 9.2* 7.1* 6.6*  HCT 29.1* 21.5* 20.5*  PLT 700* 589* 546*    Coag's  Recent Labs Lab 10/29/16 0126  INR 1.11    Sepsis Markers  Recent Labs Lab 10/29/16 0126  10/29/16 0423 10/29/16 1739 10/30/16 0030 10/30/16 0419 10/31/16 0150  LATICACIDVEN  --   < > 4.4* 2.3* 1.1  --   --   PROCALCITON 5.93  --   --   --   --  125.92 83.34  < > = values in this interval not displayed.  ABG  Recent Labs Lab 10/29/16 0047 10/29/16 0905  PHART 7.347* 7.422  PCO2ART 55.1* 33.8  PO2ART 306.0* 88.0    Liver Enzymes  Recent Labs Lab 10/25/16 0505 10/27/16 0359 10/29/16 0126  AST 50* 63* 43*  ALT 55 84* 58  ALKPHOS  133* 214* 186*  BILITOT 0.5 0.8 0.8  ALBUMIN 1.8* 1.9* 1.8*    Cardiac Enzymes  Recent Labs Lab 10/29/16 0126  TROPONINI <0.03    Glucose  Recent Labs Lab 10/30/16 0755 10/30/16 1134 10/30/16 1515 10/30/16 1935 10/30/16 2343 10/31/16 0301  GLUCAP 142* 138* 108* 91 94 99    Imaging No results found.   STUDIES:  CULTURES: Bcx 7/15 > Resp cx 7/15 >  ANTIBIOTICS: Cefepime 7/1-7/6 Augmentin 7/12-7/15 Zosyn 7/15> Vanco 7/15>  SIGNIFICANT EVENTS: 7/1 Admit to [place] with [place] 7/5- transfer to [facility] 7/15- Aspirated, intubated 7/15: central line placed 7/17: hgb to 6.7, received 1U  PRBC  LINES/TUBES: PEG 7/13> ETT 7/15 > CVC triple lumen R subclavian 7/15>>  DISCUSSION: 87OMV with HTN, CKD, Anemia, brought initially to a community hospital on 7/1 with confusion after being found down on the floor. 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 Now with aspiration PNA and persistently elevated leukocytosis and Hgb to 6.7 this AM.   ASSESSMENT / PLAN:  PULMONARY A: Acute resp failure Reccurent aspiration P: Wean vent, follow with CXR and ABGs  CARDIOVASCULAR A:  Septic shock, resolved Elevated LA (2.3>1.1) Atrial septal aneurysm. ? PFO> TEE when stable P: Continue IVF at 50cc Levophed to maintain MAP > 65, currently off TEE 7/18 prevastatin 20mg   RENAL A:  AKI, improving P: Follow urine output and Cr  GASTROINTESTINAL A:  Possible gastroparesis, ileus causing aspiraton No BM 7/17 P: Start trickle tube feeds via PEG Colace BID and lactuose 30mg   Continue pepcid  HEMATOLOGIC A:  Worsening leukocytosis 29>32 Anemia with hgb to 6.7 from 7.1  P: Follow CBC Transfuse 1U PRBC Post transfusion H/H 8.3/24.4 See ID  INFECTIOUS A:  Severe sepsis secondary to aspiration Procalcitonin trending downward 125>83 Leukocytosis 29>32 P: Discontinued vancomycin Cont zosyn Cultures pending Follow Pct  Trend WBC  ENDOCRINE A:  No acute issues P: Monitor CBGs  NEUROLOGIC A:  Multiple CVA, intubated and sedated P: precedex drip Neuro following; appreciate assistance  -DAPT for 30mo then plavix alone On plavix Daily SBT TEE 7/18   FAMILY - Updates: No family at bedside - Inter-disciplinary family meet or Palliative Care meeting due by: 7/22    Pulmonary and [place] Pager: [phone]  10/31/2016, 6:48 AM

## 2016-10-31 NOTE — Progress Notes (Addendum)
Patient's HR went into the 40s for the second time today on precedex. Drip stopped and CCM called, propofol will be stared in its place. Will continue to monitor closely. Bartholomew Crews, RN 10/31/2016 5:36 PM

## 2016-10-31 NOTE — Progress Notes (Signed)
eLink Physician-Brief Progress Note Patient Name: Scott Rivera DOB: 04-06-1951 MRN: [id number]   Date of Service  10/31/2016  HPI/Events of Note  Patient with anemia. Hemoglobin 6.6. Down trending from 7.1. Currently normotensive and off vasopressors.   eICU Interventions  1. Stat type and screen 2. Transfusing 1 unit packed red blood cells 3. Posttransfusion hemoglobin/hematocrit      Intervention Category Intermediate Interventions: Other:  Tera Partridge 10/31/2016, 2:43 AM

## 2016-10-31 NOTE — Progress Notes (Signed)
PT Cancellation Note  Patient Details Name: Scott Rivera MRN: [id number] DOB: [birth year]   Cancelled Treatment:    Reason Eval/Treat Not Completed: Medical issues which prohibited therapy. Pt intubated and not following commands.   Shary Decamp [place] 10/31/2016, 2:51 PM [employer] PT [phone]

## 2016-10-31 NOTE — Care Management Note (Signed)
Case Management Note  Patient Details  Name: Scott Rivera MRN: [id number] Date of Birth: 07-19-1950  Subjective/Objective:                    Action/Plan: Awaiting PEG placement for patient to be able to d/c to SNF. CM following.   Expected Discharge Date:  10/25/16               Expected Discharge Plan:  Skilled Nursing Facility  In-House Referral:  Clinical Social Work  Discharge planning Services  CM Consult  Post Acute Care Choice:  IP Rehab Choice offered to:     DME Arranged:    DME Agency:     HH Arranged:    [place] Agency:     Status of Service:  In process, will continue to follow  If discussed at Long Length of Stay Meetings, dates discussed:    Additional Comments: 10/31/2016 Pt transferred to ICU post aspiration on the unit requiring ventilation.   Pt is on precedex and not tolerating weaning - not following commands.  CSW continuing to follow   Maryclare Labrador, RN 10/31/2016, 2:25 PM

## 2016-11-01 ENCOUNTER — Inpatient Hospital Stay (HOSPITAL_COMMUNITY): Payer: Medicare HMO

## 2016-11-01 DIAGNOSIS — J9600 Acute respiratory failure, unspecified whether with hypoxia or hypercapnia: Secondary | ICD-10-CM

## 2016-11-01 DIAGNOSIS — I638 Other cerebral infarction: Secondary | ICD-10-CM

## 2016-11-01 LAB — CBC
HCT: 24.6 % — ABNORMAL LOW (ref 39.0–52.0)
Hemoglobin: 8.1 g/dL — ABNORMAL LOW (ref 13.0–17.0)
MCH: 28.3 pg (ref 26.0–34.0)
MCHC: 32.9 g/dL (ref 30.0–36.0)
MCV: 86 fL (ref 78.0–100.0)
Platelets: 610 10*3/uL — ABNORMAL HIGH (ref 150–400)
RBC: 2.86 MIL/uL — ABNORMAL LOW (ref 4.22–5.81)
RDW: 16 % — ABNORMAL HIGH (ref 11.5–15.5)
WBC: 21.8 10*3/uL — ABNORMAL HIGH (ref 4.0–10.5)

## 2016-11-01 LAB — BASIC METABOLIC PANEL
Anion gap: 6 (ref 5–15)
BUN: 22 mg/dL — ABNORMAL HIGH (ref 6–20)
CO2: 22 mmol/L (ref 22–32)
Calcium: 9.1 mg/dL (ref 8.9–10.3)
Chloride: 111 mmol/L (ref 101–111)
Creatinine, Ser: 1.39 mg/dL — ABNORMAL HIGH (ref 0.61–1.24)
GFR calc Af Amer: 59 mL/min — ABNORMAL LOW (ref >60)
GFR calc non Af Amer: 51 mL/min — ABNORMAL LOW (ref >60)
Glucose, Bld: 101 mg/dL — ABNORMAL HIGH (ref 65–99)
Potassium: 3.6 mmol/L (ref 3.5–5.1)
Sodium: 139 mmol/L (ref 135–145)

## 2016-11-01 LAB — GLUCOSE, CAPILLARY
Glucose-Capillary: 107 mg/dL — ABNORMAL HIGH (ref 65–99)
Glucose-Capillary: 119 mg/dL — ABNORMAL HIGH (ref 65–99)
Glucose-Capillary: 75 mg/dL (ref 65–99)
Glucose-Capillary: 82 mg/dL (ref 65–99)
Glucose-Capillary: 88 mg/dL (ref 65–99)
Glucose-Capillary: 89 mg/dL (ref 65–99)
Glucose-Capillary: 96 mg/dL (ref 65–99)

## 2016-11-01 LAB — BPAM RBC
Blood Product Expiration Date: 201808022359
ISSUE DATE / TIME: 201807170415
Unit Type and Rh: 5100

## 2016-11-01 LAB — TYPE AND SCREEN
ABO/RH(D): O POS
Antibody Screen: NEGATIVE
Unit division: 0

## 2016-11-01 LAB — PROCALCITONIN: Procalcitonin: 46.97 ng/mL

## 2016-11-01 LAB — TRIGLYCERIDES: Triglycerides: 170 mg/dL — ABNORMAL HIGH (ref <150)

## 2016-11-01 LAB — MAGNESIUM: Magnesium: 1.9 mg/dL (ref 1.7–2.4)

## 2016-11-01 LAB — PHOSPHORUS: Phosphorus: 3.2 mg/dL (ref 2.5–4.6)

## 2016-11-01 MED ORDER — PROPOFOL 1000 MG/100ML IV EMUL
0.0000 ug/kg/min | INTRAVENOUS | Status: DC
  Administered 2016-11-01: 35 ug/kg/min via INTRAVENOUS
  Administered 2016-11-01: 45 ug/kg/min via INTRAVENOUS
  Administered 2016-11-02: 35 ug/kg/min via INTRAVENOUS
  Administered 2016-11-02: 30 ug/kg/min via INTRAVENOUS
  Administered 2016-11-02: 35 ug/kg/min via INTRAVENOUS
  Administered 2016-11-02: 30 ug/kg/min via INTRAVENOUS
  Administered 2016-11-03: 20 ug/kg/min via INTRAVENOUS
  Administered 2016-11-03: 30 ug/kg/min via INTRAVENOUS
  Administered 2016-11-04: 10 ug/kg/min via INTRAVENOUS
  Filled 2016-11-01 (×9): qty 100

## 2016-11-01 MED ORDER — MIDAZOLAM HCL 2 MG/2ML IJ SOLN
INTRAMUSCULAR | Status: AC
  Administered 2016-11-01: 2 mg
  Filled 2016-11-01: qty 2

## 2016-11-01 MED ORDER — LACTULOSE 10 GM/15ML PO SOLN
30.0000 g | Freq: Once | ORAL | Status: AC
  Administered 2016-11-01: 30 g
  Filled 2016-11-01 (×2): qty 45

## 2016-11-01 MED ORDER — FENTANYL CITRATE (PF) 100 MCG/2ML IJ SOLN
INTRAMUSCULAR | Status: AC
  Administered 2016-11-01: 100 ug
  Filled 2016-11-01: qty 2

## 2016-11-01 MED ORDER — PROPOFOL 1000 MG/100ML IV EMUL
INTRAVENOUS | Status: AC
  Administered 2016-11-01: 45 mg/h
  Filled 2016-11-01: qty 100

## 2016-11-01 NOTE — Progress Notes (Signed)
STROKE TEAM PROGRESS NOTE   SUBJECTIVE (INTERVAL HISTORY) Dr. Elsworth Soho and RN are at bedside. Pt off sedation, since doing well on weaning trials this morning, plan to extubate today. Discussed with sister at bedside, if patient not able to tolerating extubation, she would like patient to be reintubated and considering tracheostomy.   OBJECTIVE Temp:  [97.2 F (36.2 C)-99.7 F (37.6 C)] 98.5 F (36.9 C) (07/18 1144) Pulse Rate:  [53-127] 109 (07/18 1620) Cardiac Rhythm: Normal sinus rhythm;Sinus tachycardia (07/18 1200) Resp:  [12-45] 20 (07/18 1620) BP: (96-174)/(52-92) 124/76 (07/18 1620) SpO2:  [90 %-100 %] 100 % (07/18 1620) FiO2 (%):  [30 %-60 %] 60 % (07/18 1620) Weight:  [174 lb 13.2 oz (79.3 kg)] 174 lb 13.2 oz (79.3 kg) (07/18 0452)  CBC:   Recent Labs Lab 10/29/16 0126  10/31/16 0150 10/31/16 0721 11/01/16 0450  WBC 19.5*  < > 32.4*  --  21.8*  NEUTROABS 17.1*  --   --   --   --   HGB 9.2*  < > 6.6* 8.3* 8.1*  HCT 29.1*  < > 20.5* 24.4* 24.6*  MCV 88.4  < > 88.0  --  86.0  PLT 700*  < > 546*  --  610*  < > = values in this interval not displayed.  Basic Metabolic Panel:   Recent Labs Lab 10/30/16 0419 10/31/16 0150 11/01/16 0450  NA 139 137 139  K 4.2 3.9 3.6  CL 108 109 111  CO2 22 20* 22  GLUCOSE 85 134* 101*  BUN 30* 24* 22*  CREATININE 1.83* 1.69* 1.39*  CALCIUM 8.8* 9.0 9.1  MG 1.9  --  1.9  PHOS 3.7  --  3.2    Lipid Panel:     Component Value Date/Time   CHOL 92 10/21/2016 0253   TRIG 172 (H) 10/31/2016 1730   HDL 14 (L) 10/21/2016 0253   CHOLHDL 6.6 10/21/2016 0253   VLDL 21 10/21/2016 0253   LDLCALC 57 10/21/2016 0253   HgbA1c:  Lab Results  Component Value Date   HGBA1C 5.4 10/21/2016   Urine Drug Screen:     Component Value Date/Time   LABOPIA NONE DETECTED 10/19/2016 0050   COCAINSCRNUR NONE DETECTED 10/19/2016 0050   LABBENZ NONE DETECTED 10/19/2016 0050   AMPHETMU NONE DETECTED 10/19/2016 0050   THCU NONE DETECTED  10/19/2016 0050   LABBARB NONE DETECTED 10/19/2016 0050    Alcohol Level No results found for: [place] I have personally reviewed the radiological images below and agree with the radiology interpretations.  Ct Angio Head W Or Wo Contrast Ct Angio Neck W Or Wo Contrast 10/20/2016 1. No acute arterial finding.  2. Advanced left and mild right V1 segment atheromatous stenosis. No visible ulcerated plaque.  3. Motion artifact from swallowing significantly degrades evaluation of the cervical carotids, especially on the left. No noted flow limiting stenosis.  4. Diminished number of left MCA branches in the setting of remote infarct.  5. No visible progression or hemorrhagic conversion of the acute posterior circulation infarcts.  6. Remote left MCA and ACA territory infarcts.   Dg Chest Port 1 View 10/19/2016 Increasing volume loss in the left hemithorax with progressive left lung opacities. Suspect aspiration/mucous plugging versus increasing pneumonia.   MRI brian - b/l cerebellar L>R, right pons, right midbrain, right PCA territory and left thalamus infarcts.   TTE  Left ventricle: The cavity size was normal. Wall thickness was   increased in a pattern of  mild LVH. Systolic function was normal.   The estimated ejection fraction was in the range of 60% to 65%.   Wall motion was normal; there were no regional wall motion   abnormalities. - Atrial septum: The interatrial septum was hypermobile. A patent   foramen ovale cannot be excluded. 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 - Recommendations: Consider transesophageal echocardiography if   clinically indicated.  LE venous doppler  - No evidence of deep vein thrombosis involving the visualized   veins of the right lower extremity and left lower extremity. - No evidence of Baker&'s cyst on the right or left.  TEE - Left ventricle: The cavity size was normal. Wall thickness was   normal. Systolic function was normal.  The estimated ejection   fraction was in the range of 60% to 65%. - Aortic valve: No evidence of vegetation. - Mitral valve: No evidence of vegetation. - Left atrium: No evidence of thrombus in the appendage. - Right atrium: No evidence of thrombus in the atrial cavity or   appendage. - Atrial septum: The septum bowed from left to right, consistent   with increased left atrial pressure. No defect or patent foramen   ovale was identified. Echo contrast study showed no right-to-left   atrial level shunt, following an increase in RA pressure induced   by provocative maneuvers. - Tricuspid valve: No evidence of vegetation. - Pulmonic valve: No evidence of vegetation.   PHYSICAL EXAM  Temp:  [97.2 F (36.2 C)-99.7 F (37.6 C)] 98.5 F (36.9 C) (07/18 1144) Pulse Rate:  [53-127] 109 (07/18 1620) Resp:  [12-45] 20 (07/18 1620) BP: (96-174)/(52-92) 124/76 (07/18 1620) SpO2:  [90 %-100 %] 100 % (07/18 1620) FiO2 (%):  [30 %-60 %] 60 % (07/18 1620) Weight:  [174 lb 13.2 oz (79.3 kg)] 174 lb 13.2 oz (79.3 kg) (07/18 0452)  General - thin, well developed, intubated on precedex  Ophthalmologic - Fundi not visualized due to noncooperation.  Cardiovascular - Regular rate and rhythm.  Neuro - intubated on precedex. Eyes open, more awake alert but also more agitated, trying to sit up from bed. Not following commands, PERRL, blinking to visual threat bilaterally, tracking objects, attending to both sides, eyes moving to both directions. positive corneal and gag. Facial symmetry not able to assess due to ET tube. BUE spontaneous movement, on pain stimulation, BLE mild withdraw symmetrically. DTR 1+ and no babinski. Sensation, coordination and gait not tested.   ASSESSMENT/PLAN Scott Rivera is a 66 y.o. male with history of previous strokes, hypertension and ongoing tobacco use presenting with severe dysarthria. He did not receive IV t-PA due to late presentation.  Stroke:  Extensive b/l  posterior cerebral infarcts, embolic pattern, etiology unclear   Resultant  Severe dysarthria and dysphagia  CT head evolving bilateral cerebellar, right midbrain and left thalamic infarcts  MRI head - OSH - extensive multiple acute infarcts b/l cerebellar, right pons, right midbrain, right PCA and left thalamus  CTA H&N - Right V1segment origin stenosis due to atheromatous plaque.   2D Echo - EF 60% to 65%. ASA present, but PFO not able to exclude  TEE - atrial septum bowed from left to right, consistent with increased left atrial pressure. No PFO  Loop recorder may be considered in the future.  LE venous doppler no DVT  LDL - 24  HgbA1c - 5.4  VTE prophylaxis - heparin subq Diet - low sodium heart healthy Diet NPO time specified  No  antithrombotic prior to admission, now on aspirin 325 mg daily and plavix. Continue DAPT for 3 months and then plavix alone.   Patient counseled to be compliant with his antithrombotic medications  Ongoing aggressive stroke risk factor management  Therapy recommendations: SNF  Disposition:  Pending  Respiratory distress  Likely due to aspiration   CCM on board  Intubated on vent  Plan to extubate today  Sister would like patient to be reintubated E if not tolerating extubation and will consider tracheostomy  Sepsis with aspiration pneumonia  off pressor, BP stable  Off vanco and on zosyn  CCM on board  Leukocytosis 19.5 -> 29.1->32.4->21.8  Anemia   Hb 9.2->7.1->6.6->trasfusion->8.3->8.1  CBC monitoring  Hyperlipidemia  Home meds: Mevacor 20 mg daily not resumed in hospital  LDL 24, goal < 70  On pravastatin 20mg  now  Continue statin at discharge  Dysphagia   Severe dysarthria  Did not pass swallow  S/p PEG  TF via PEG  Other Stroke Risk Factors  Advanced age  Tobacco abuse - cessation counsueling provided  ETOH use, advised to drink no more than 1 drink per day.  Hx stroke/TIA by  imaging  Other Active Problems  Elevated creatinine 2.02->1.83->1.69->1.39  Hospital day # 13  This patient is critically ill due to extensive posterior stroke, dysphagia, aspiration and at significant risk of neurological worsening, death form sepsis, septic shock, recurrent stroke. This patient's care requires constant monitoring of vital signs, hemodynamics, respiratory and cardiac monitoring, review of multiple databases, neurological assessment, discussion with family, other specialists and medical decision making of high complexity. I spent 40 minutes of neurocritical care time in the care of this patient. I had long discussion with sister at bedside, updated pt current condition, treatment plan and potential prognosis. She would like patient to be intubated if patient not able to tolerate extubation, she also will consider tracheostomy in that situation.    Rosalin Hawking, MD PhD Stroke Neurology 11/01/2016 5:08 PM   To contact Stroke Continuity provider, please refer to [email or web address] After hours, contact General Neurology

## 2016-11-01 NOTE — Procedures (Signed)
Extubation Procedure Note  Patient Details:   Name: Scott Rivera DOB: [birth year] MRN: [id number]   Airway Documentation:     Evaluation  O2 sats: stable throughout and currently acceptable Complications: No apparent complications Patient did tolerate procedure well. Bilateral Breath Sounds: Rhonchi   No- Pt not speaking or following commands.  Miquel Dunn 11/01/2016, 11:33 AM

## 2016-11-01 NOTE — NC FL2 (Signed)
[place] LEVEL OF CARE SCREENING TOOL     IDENTIFICATION  Patient Name: Scott Rivera Birthdate: 05/01/1950 Sex: male Admission Date (Current Location): 10/19/2016  [facility] and [place] Number:  Herbalist and Address:  [facility] [place], [place] [address] [place], [place] [address]      Provider Number: [id number]  Attending Physician Name and Address:  Javier Glazier, MD  Relative Name and Phone Number:  Marzetta Board ([phone]) Mother     Current Level of Care: Hospital Recommended Level of Care: [place] Prior Approval Number:    Date Approved/Denied:   PASRR Number: [id number]  Discharge Plan: SNF    Current Diagnoses: Patient Active Problem List   Diagnosis Date Noted  . Acute respiratory failure with hypoxia ([place])   . Aspiration into airway   . Septic shock ([place])   . Dysphagia, post-stroke   . Benign essential HTN   . Tobacco abuse   . Tachypnea   . Hyperglycemia   . Hypernatremia   . Leukocytosis   . Acute blood loss anemia   . Chronic kidney disease   . Pressure injury of skin 10/20/2016  . Cerebellar infarct ([place]) 10/19/2016  . ARF (acute renal failure) ([place]) 10/19/2016  . Essential hypertension 10/19/2016  . Rhabdomyolysis 10/19/2016  . Acute ischemic stroke ([place]) 10/19/2016    Orientation RESPIRATION BLADDER Height & Weight      (Unable to access at this time due to pt being Intubated/Tracheostomy)  Normal Incontinent, External catheter Weight: 174 lb 13.2 oz (79.3 kg) Height:  '5\' 11"'$  (180.3 cm)  BEHAVIORAL SYMPTOMS/MOOD NEUROLOGICAL BOWEL NUTRITION STATUS      Incontinent Feeding tube (will have PEG placed prior to d/c)  AMBULATORY STATUS COMMUNICATION OF NEEDS Skin   Extensive Assist Does not communicate (incomprehensible speech at this time) PU Stage and Appropriate Care, Skin abrasions   PU Stage 2 Dressing:  (PRN)                   Personal Care Assistance Level of  Assistance  Bathing, Dressing Bathing Assistance: Maximum assistance   Dressing Assistance: Maximum assistance     Functional Limitations Info  Speech     Speech Info: Impaired (incomprehensible speech)    SPECIAL CARE FACTORS FREQUENCY  PT (By licensed PT), OT (By licensed OT)     PT Frequency: 5x/wk OT Frequency: 5x/wk            Contractures      Additional Factors Info  Code Status, Allergies Code Status Info: full Allergies Info: nka           Current Medications (11/01/2016):  This is the current hospital active medication list Current Facility-Administered Medications  Medication Dose Route Frequency Provider Last Rate Last Dose  . acetaminophen (TYLENOL) solution 650 mg  650 mg Per Tube Q4H PRN Rise Patience, MD   650 mg at 10/28/16 1055   Or  . acetaminophen (TYLENOL) suppository 650 mg  650 mg Rectal Q4H PRN Rise Patience, MD   650 mg at 10/29/16 0242  . aspirin tablet 325 mg  325 mg Per Tube Daily Cherene Altes, MD   325 mg at 10/31/16 0932  . bethanechol (URECHOLINE) tablet 10 mg  10 mg Per Tube TID Cherene Altes, MD   10 mg at 10/31/16 2114  . chlorhexidine gluconate (MEDLINE KIT) (PERIDEX) 0.12 % solution 15 mL  15 mL Mouth Rinse BID Hammonds,  Sharyn Blitz, MD   15 mL at 11/01/16 0857  . clopidogrel (PLAVIX) tablet 75 mg  75 mg Per Tube Daily Cherene Altes, MD   75 mg at 10/31/16 0932  . dextrose 5 %-0.9 % sodium chloride infusion   Intravenous Continuous Eloise Levels, MD 50 mL/hr at 10/31/16 1800    . docusate (COLACE) 50 MG/5ML liquid 100 mg  100 mg Per Tube BID PRN Hammonds, Sharyn Blitz, MD      . famotidine (PEPCID) 40 MG/5ML suspension 20 mg  20 mg Per Tube BID Rigoberto Noel, MD   20 mg at 10/31/16 2114  . feeding supplement (VITAL AF 1.2 CAL) liquid 1,000 mL  1,000 mL Per Tube Continuous Rigoberto Noel, MD   Stopped at 10/31/16 2353  . free water 200 mL  200 mL Per Tube QID Orson Eva J, DO   200 mL at 10/31/16 2200  .  heparin injection 5,000 Units  5,000 Units Subcutaneous Q8H Hammonds, Sharyn Blitz, MD   5,000 Units at 11/01/16 0517  . hydrALAZINE (APRESOLINE) injection 10 mg  10 mg Intravenous Q4H PRN Rise Patience, MD      . insulin aspart (novoLOG) injection 0-9 Units  0-9 Units Subcutaneous Q4H Erick Colace, NP   1 Units at 10/31/16 1957  . MEDLINE mouth rinse  15 mL Mouth Rinse QID Hammonds, Sharyn Blitz, MD   15 mL at 11/01/16 0452  . multivitamin liquid 15 mL  15 mL Per Tube Daily Lady Deutscher, MD   15 mL at 10/31/16 0932  . neomycin-bacitracin-polymyxin (NEOSPORIN) ointment   Topical Daily Hoss, Arthur, MD   1 application at 83/67/25 1000  . nicotine (NICODERM CQ - dosed in mg/24 hours) patch 14 mg  14 mg Transdermal Daily Rise Patience, MD   14 mg at 10/31/16 0932  . piperacillin-tazobactam (ZOSYN) IVPB 3.375 g  3.375 g Intravenous Q8H Laren Everts, RPH   Stopped at 11/01/16 0912  . pravastatin (PRAVACHOL) tablet 20 mg  20 mg Per Tube q1800 Cherene Altes, MD   20 mg at 10/31/16 1654  . thiamine (B-1) injection 100 mg  100 mg Intravenous Daily Rise Patience, MD   100 mg at 10/31/16 5001     Discharge Medications: Please see discharge summary for a list of discharge medications.  Relevant Imaging Results:  Relevant Lab Results:   Additional Information SS#: [id number]  Wetzel Bjornstad, LCSWA

## 2016-11-01 NOTE — Progress Notes (Signed)
NTS performed via pts left nare.  Lubrication was used.  A large amt of thick white/[place] secretions were returned.  Site left intact.  Pt tolerated procedure well. VSS Stable on 2 Lpm [place].

## 2016-11-01 NOTE — Procedures (Signed)
Bronchoscopy Procedure Note Scott Rivera [id number] 12-Dec-1950  Procedure: Bronchoscopy Indications: Obtain specimens for culture and/or other diagnostic studies  Procedure Details Consent: Risks of procedure as well as the alternatives and risks of each were explained to the (patient/caregiver).  Consent for procedure obtained. Time Out: Verified patient identification, verified procedure, site/side was marked, verified correct patient position, special equipment/implants available, medications/allergies/relevent history reviewed, required imaging and test results available.  Performed  In preparation for procedure, patient was given 100% FiO2. Sedation: Benzodiazepines, Muscle relaxants and Etomidate  Airway entered and the following bronchi were examined: RUL, RML, RLL, LUL and LLL.   No endobronchial lesions or abnormal secretions seen.  Procedures performed: RLL superior segmental BAL. 40cc instilled, 15cc returned  Evaluation Hemodynamic Status: BP stable throughout; O2 sats: stable throughout Patient's Current Condition: stable Specimens:  RLL Sup Seg BAL for cx data Complications: No apparent complications Patient did tolerate procedure well.   Baltazar Apo, MD, PhD 11/01/2016, 4:13 PM [facility] [phone] or if no answer [phone]

## 2016-11-01 NOTE — Procedures (Signed)
Intubation Procedure Note Scott Rivera [id number] 30-Mar-1951  Procedure: Intubation Indications: Respiratory insufficiency  Procedure Details Consent: Unable to obtain consent because of emergent medical necessity. Time Out: Verified patient identification, verified procedure, site/side was marked, verified correct patient position, special equipment/implants available, medications/allergies/relevent history reviewed, required imaging and test results available.  Performed Presedated with Fentanyl 100 mcg, Versed 2 mg, and Etomidate 20 mg.  Maximum sterile technique was used including cap, gloves, gown, hand hygiene and mask.  MAC and 4 glidescope. Grade 1 view.  8 ETT placed at 25 cm at the gum.  Poor dentition.    Evaluation Hemodynamic Status: BP stable throughout; O2 sats: stable throughout Patient's Current Condition: stable Complications: No apparent complications Patient did tolerate procedure well. Chest X-ray ordered to verify placement.  CXR: pending.  Called to bedside for respiratory insufficiency after extubation this am.  Since extubation, patient has vomited twice per RN emesis with fecal like content.   Patient requiring NRB for saturations with diffuse rhonchi and accessory muscle use.     [name], AGACNP-BC [facility] Pgr: [phone] or if no answer [phone] 11/01/2016, 3:50 PM

## 2016-11-01 NOTE — Progress Notes (Signed)
Occupational Therapy Discharge Patient Details Name: Scott Rivera MRN: [id number] DOB: [birth year] Today's Date: 11/01/2016 Time:  -     Patient discharged from OT services secondary to medical decline - will need to re-order OT to resume therapy services.  Please see latest therapy progress note for current level of functioning and progress toward goals.    Progress and discharge plan discussed with patient and/or caregiver: Patient unable to participate in discharge planning.  Pt is intubated and sedated.    Erisha Paugh [place], OTR/L [phone]  [place], Adelia Baptista M 11/01/2016, 10:56 AM

## 2016-11-01 NOTE — CV Procedure (Signed)
    Transesophageal echocardiogram GRANT SWAGER [id number] 07-21-1950  Procedure:TEE Indications:  Stroke  Time Out: Verified patient identification, verified procedure,medications/allergies/relevent history reviewed, required imaging and test results available.  Performed  Procedure Details  The patient was NPO after midnight. Intubated, sedate with Propofol.  In ICU setting.  Findings:  - No embolic source  - Negative bubble study  - Hypermobile intraatrial septum  - Normal EF  - Trace MR  - Mild TR (mildly elevated pulmonary pressure)  - No LA thrombus   Candee Furbish 11/01/2016, 9:34 AM

## 2016-11-01 NOTE — Progress Notes (Signed)
Pt. Tachypnic with respiratory rate in the 40's-50's. Pt. Vomited large amount of emesis with stool particles. Dr. Elsworth Soho and Warren Lacy notified. Pt. Remained alert but not following commands. Dr. Lamonte Sakai reintubated with 100mg  Fentanyl, 2mg  Versed, 40mg  Etomidate, and 80mg  of Rocuronium. After intubated, Pt. Remained agitated and noncompliant with the ventilator. Propofol gtt started, 100mg  of Fentanyl, and 2mg  Versed given for bronchoscopy. Vitals remained stable throughout. Pt. Resting comfortably on Propofol gtt now. Will continue to closely monitor.

## 2016-11-01 NOTE — Care Management Note (Signed)
Case Management Note  Patient Details  Name: Scott Rivera MRN: [id number] Date of Birth: 10/08/1950  Subjective/Objective:                    Action/Plan: Awaiting PEG placement for patient to be able to d/c to SNF. CM following.   Expected Discharge Date:  10/25/16               Expected Discharge Plan:  Skilled Nursing Facility  In-House Referral:  Clinical Social Work  Discharge planning Services  CM Consult  Post Acute Care Choice:  IP Rehab Choice offered to:     DME Arranged:    DME Agency:     HH Arranged:    [place] Agency:     Status of Service:  In process, will continue to follow  If discussed at Long Length of Stay Meetings, dates discussed:    Additional Comments: 11/01/2016  Pt extubated today - however already requiring NT suctioning.  CSW following for placement  10/31/16 Pt transferred to ICU post aspiration on the unit requiring ventilation.   Pt is on precedex and not tolerating weaning - not following commands.  CSW continuing to follow   Maryclare Labrador, RN 11/01/2016, 3:00 PM

## 2016-11-01 NOTE — Progress Notes (Signed)
Physical Therapy Discharge Patient Details Name: Scott Rivera MRN: [id number] DOB: July 15, 1950 Today's Date: 11/01/2016 Time:  -     Patient discharged from PT services secondary to medical decline - will need to re-order PT to resume therapy services. Pt remains intubated.  Please see latest therapy progress note for current level of functioning and progress toward goals.    Progress and discharge plan discussed with patient and/or caregiver: Patient unable to participate in discharge planning and no caregivers available  GP     Shary Decamp [facility] 11/01/2016, 9:03 AM   Suanne Marker PT [phone]

## 2016-11-01 NOTE — Progress Notes (Signed)
PULMONARY / CRITICAL CARE MEDICINE   Name: Scott Rivera MRN: [id number] DOB: [birth year]    ADMISSION DATE:10/19/2016 CONSULTATION DATE:10/29/16  CHIEF COMPLAINT:Respiratory distress  HISTORY OF PRESENT ILLNESS: 66yoM with HTN, CKD, Anemia, brought initially to a community hospital on 7/1 with confusion after being found down on the floor. MRI brain on admission showed multiple acute infarcts within both hemispheres of cerebellum and right occipital lobe right midbrain and left thalamus. CXR at that time also showed multifocal pneumonia. He initially had rhabdomyolysis and AKI, both of which resolved. Family then requested transfer to [facility] on 10/19/16. PEG was placed on 10/27/16. He completed a 7 day course of Cefepime then transitioned to Augmentin. Then overnight patient aspirated and developed respiratory distress with RR in 40's with worsening acute hypoxic respiratory failure, precipitating emergent transfer to ICU for intubation. Intubation complicated by large emesis with aspiration during the procedure. PEG placed to suction and large volume of gastric secretions removed.   SUBJECTIVE: Remains intubated, HR dropped to 40s on precedex. Switched to propofol. TEE performed this AM.    VITAL SIGNS: BP (!) 110/59   Pulse 70   Temp 98 F (36.7 C) (Oral)   Resp 20   Ht 5\' 11"  (1.803 m)   Wt 174 lb 13.2 oz (79.3 kg)   SpO2 100%   BMI 24.38 kg/m   HEMODYNAMICS:    VENTILATOR SETTINGS: Vent Mode: PRVC FiO2 (%):  [30 %] 30 % Set Rate:  [20 bmp] 20 bmp Vt Set:  [600 mL] 600 mL PEEP:  [5 cmH20] 5 cmH20 Pressure Support:  [12 cmH20] 12 cmH20 Plateau Pressure:  [25 URK27-06 cmH20] 28 cmH20  INTAKE / OUTPUT: I/O last 3 completed shifts: In: 4569.6 [I.V.:2055.2; Blood:335; Other:140; CB/JS:2831.5; IV Piggyback:450] Out: 2735 [Urine:2735]  PHYSICAL EXAMINATION: Gen: 66yo M appearing comfortably sedated and in NAD HEENT: PERRL, MMM Lungs: CTABL CV: RRR, no MRG Abd: +  BS, NTND Ext:no edema Skin: warm and dry, no rashes Neuro: sedated, opens eyes to noxious stimuli, and was able to move upper and lower extremities on L side.   LABS:  BMET  Recent Labs Lab 10/30/16 0419 10/31/16 0150 11/01/16 0450  NA 139 137 139  K 4.2 3.9 3.6  CL 108 109 111  CO2 22 20* 22  BUN 30* 24* 22*  CREATININE 1.83* 1.69* 1.39*  GLUCOSE 85 134* 101*    Electrolytes  Recent Labs Lab 10/29/16 1740 10/30/16 0419 10/31/16 0150 11/01/16 0450  CALCIUM 8.6* 8.8* 9.0 9.1  MG 1.9 1.9  --  1.9  PHOS 4.0 3.7  --  3.2    CBC  Recent Labs Lab 10/30/16 0419 10/31/16 0150 10/31/16 0721 11/01/16 0450  WBC 29.1* 32.4*  --  21.8*  HGB 7.1* 6.6* 8.3* 8.1*  HCT 21.5* 20.5* 24.4* 24.6*  PLT 589* 546*  --  610*    Coag's  Recent Labs Lab 10/29/16 0126  INR 1.11    Sepsis Markers  Recent Labs Lab 10/29/16 0423 10/29/16 1739 10/30/16 0030 10/30/16 0419 10/31/16 0150 11/01/16 0450  LATICACIDVEN 4.4* 2.3* 1.1  --   --   --   PROCALCITON  --   --   --  125.92 83.34 46.97    ABG  Recent Labs Lab 10/29/16 0047 10/29/16 0905  PHART 7.347* 7.422  PCO2ART 55.1* 33.8  PO2ART 306.0* 88.0    Liver Enzymes  Recent Labs Lab 10/27/16 0359 10/29/16 0126  AST 63* 43*  ALT 84* 58  ALKPHOS 214*  186*  BILITOT 0.8 0.8  ALBUMIN 1.9* 1.8*    Cardiac Enzymes  Recent Labs Lab 10/29/16 0126  TROPONINI <0.03    Glucose  Recent Labs Lab 10/31/16 0741 10/31/16 1223 10/31/16 1531 10/31/16 1949 11/01/16 0020 11/01/16 0352  GLUCAP 127* 127* 106* 129* 89 88    Imaging Dg Chest Port 1 View  Result Date: 11/01/2016 CLINICAL DATA:  Respiratory failure . EXAM: PORTABLE CHEST 1 VIEW COMPARISON:  10/31/2016 FINDINGS: Surgical clips upper chest. Endotracheal tube and right subclavian line in stable position. Heart size stable. Persistent bibasilar infiltrates again noted. Slight improvement in aeration right lung base. No pleural effusion or  pneumothorax . Posttraumatic deformity right shoulder again noted. 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 IMPRESSION: 1.  Lines and tubes in stable position. 2. Persistent bibasilar infiltrates consistent pneumonia. Slight improvement in aeration in the right lung base from prior day's exam. Electronically Signed   By: Marcello Moores  Register   On: 11/01/2016 07:07   Dg Chest Port 1 View  Result Date: 10/31/2016 CLINICAL DATA:  Pneumonia, history hypertension, stroke EXAM: PORTABLE CHEST 1 VIEW COMPARISON:  Portable exam 0821 hours compared to 10/30/2016 FINDINGS: Tip of endotracheal tube projects 5.1 cm above carina. RIGHT subclavian central venous catheter with tip projecting over cavoatrial junction region. Upper normal heart size. Mediastinal contours and pulmonary vascularity normal. Persistent BILATERAL lower lobe infiltrates greater on RIGHT. Upper lungs grossly clear. No definite pleural effusion or pneumothorax. Ossification within RIGHT coracoclavicular ligament suggests prior injury. Sclerosis in the LEFT humeral head question avascular necrosis. IMPRESSION: BILATERAL lower lobe pulmonary infiltrates consistent with pneumonia, greater on RIGHT. Electronically Signed   By: Lavonia Dana M.D.   On: 10/31/2016 08:45     STUDIES:  CULTURES: Bcx 7/15 > Resp cx 7/15 >  ANTIBIOTICS: Cefepime 7/1-7/6 Augmentin 7/12-7/15 Zosyn 7/15> Vanco 7/15>  SIGNIFICANT EVENTS: 7/1 Admit to [place] with [place] 7/5- transfer to [facility] 7/15- Aspirated, intubated 7/15: central line placed 7/17: hgb to 6.7, received 1U PRBC  LINES/TUBES: PEG 7/13> ETT 7/15 > CVC triple lumen R subclavian 7/15>>  DISCUSSION: [id number] with HTN, CKD, Anemia, brought initially to a community hospital on 7/1 with confusion after being found down on the floor. 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 Now with aspiration  PNA with stable CXR, improving leukocytosis and stable Hgb s/p 1U PRBC 7/17.   ASSESSMENT / PLAN:  PULMONARY A: Acute resp failure Reccurent aspiration P: Wean vent, plan for extubation after bowel movement See ID  CARDIOVASCULAR A:  Septic shock, resolved Atrial septal aneurysm. ? PFO> TEE 7/18 @ bedside P: Continue D5 at 50cc Levophed to maintain MAP > 65, currently off TEE 2/40:  - No embolic source, Negative bubble study, Hypermobile intraatrial septum, Normal EF, Trace MR, Mild TR (mildly elevated pulmonary pressure), No LA thrombus prevastatin 20mg   RENAL A:  AKI, improving P: Follow urine output and Cr  GASTROINTESTINAL A:  Possible gastroparesis, ileus causing aspiraton No BM 7/17 P: Tube feeds at 65cc/hr via PEG Soap suds enema today Continue pepcid  HEMATOLOGIC A:  Improving leukocytosis 32>21.8 Anemia, stable at 8.1 P: Follow CBC See ID  INFECTIOUS A:  Severe sepsis secondary to aspiration Procalcitonin trending downward 83>46.9 Leukocytosis improving P: Cont zosyn Cultures pending  Follow Pct  Trend WBC  ENDOCRINE A:  No acute issues P: Monitor CBGs  NEUROLOGIC A:  Multiple CVA, intubated and sedated P: Sedation  off Plan for extubation after bowel movement Neuro following; appreciate assistance  -DAPT for 65mo then plavix alone On plavix Daily SBT Plan for loop recorder    FAMILY - Updates: No family at bedside - Inter-disciplinary family meet or Palliative Care meeting due by: 7/22    Pulmonary and [place] Pager: [phone]  11/01/2016, 7:35 AM

## 2016-11-02 ENCOUNTER — Inpatient Hospital Stay (HOSPITAL_COMMUNITY): Payer: Medicare HMO

## 2016-11-02 LAB — CBC
HCT: 25.3 % — ABNORMAL LOW (ref 39.0–52.0)
Hemoglobin: 8.3 g/dL — ABNORMAL LOW (ref 13.0–17.0)
MCH: 28.4 pg (ref 26.0–34.0)
MCHC: 32.8 g/dL (ref 30.0–36.0)
MCV: 86.6 fL (ref 78.0–100.0)
Platelets: 598 10*3/uL — ABNORMAL HIGH (ref 150–400)
RBC: 2.92 MIL/uL — ABNORMAL LOW (ref 4.22–5.81)
RDW: 15.8 % — ABNORMAL HIGH (ref 11.5–15.5)
WBC: 22.4 10*3/uL — ABNORMAL HIGH (ref 4.0–10.5)

## 2016-11-02 LAB — BASIC METABOLIC PANEL
Anion gap: 8 (ref 5–15)
BUN: 17 mg/dL (ref 6–20)
CO2: 22 mmol/L (ref 22–32)
Calcium: 8.9 mg/dL (ref 8.9–10.3)
Chloride: 112 mmol/L — ABNORMAL HIGH (ref 101–111)
Creatinine, Ser: 1.46 mg/dL — ABNORMAL HIGH (ref 0.61–1.24)
GFR calc Af Amer: 56 mL/min — ABNORMAL LOW (ref >60)
GFR calc non Af Amer: 48 mL/min — ABNORMAL LOW (ref >60)
Glucose, Bld: 95 mg/dL (ref 65–99)
Potassium: 3.5 mmol/L (ref 3.5–5.1)
Sodium: 142 mmol/L (ref 135–145)

## 2016-11-02 LAB — GLUCOSE, CAPILLARY
Glucose-Capillary: 88 mg/dL (ref 65–99)
Glucose-Capillary: 79 mg/dL (ref 65–99)
Glucose-Capillary: 84 mg/dL (ref 65–99)
Glucose-Capillary: 116 mg/dL — ABNORMAL HIGH (ref 65–99)
Glucose-Capillary: 90 mg/dL (ref 65–99)
Glucose-Capillary: 93 mg/dL (ref 65–99)

## 2016-11-02 LAB — PROCALCITONIN: Procalcitonin: 28.36 ng/mL

## 2016-11-02 MED ORDER — DOCUSATE SODIUM 50 MG/5ML PO LIQD
100.0000 mg | Freq: Two times a day (BID) | ORAL | Status: DC
  Administered 2016-11-02 – 2016-11-05 (×6): 100 mg
  Filled 2016-11-02 (×8): qty 10

## 2016-11-02 MED ORDER — VITAL HIGH PROTEIN PO LIQD: 1000.0000 mL | ORAL | Status: DC

## 2016-11-02 MED ORDER — PRO-STAT SUGAR FREE PO LIQD
30.0000 mL | Freq: Every day | ORAL | Status: DC
  Administered 2016-11-03 – 2016-11-09 (×7): 30 mL
  Filled 2016-11-02 (×7): qty 30

## 2016-11-02 MED ORDER — ASPIRIN 325 MG PO TABS
325.0000 mg | ORAL_TABLET | Freq: Every day | ORAL | Status: DC
  Administered 2016-11-02 – 2016-11-29 (×28): 325 mg
  Filled 2016-11-02 (×30): qty 1

## 2016-11-02 MED ORDER — PRO-STAT SUGAR FREE PO LIQD
30.0000 mL | Freq: Two times a day (BID) | ORAL | Status: DC
  Administered 2016-11-02: 30 mL
  Filled 2016-11-02: qty 30

## 2016-11-02 MED ORDER — SODIUM CHLORIDE 0.9 % IV SOLN
INTRAVENOUS | Status: DC
  Administered 2016-11-02 – 2016-11-06 (×3): via INTRAVENOUS

## 2016-11-02 MED ORDER — VITAL AF 1.2 CAL PO LIQD
1000.0000 mL | ORAL | Status: DC
Start: 2016-11-02 — End: 2016-11-08
  Administered 2016-11-02 – 2016-11-07 (×5): 1000 mL
  Filled 2016-11-02: qty 1000

## 2016-11-02 NOTE — Progress Notes (Signed)
PULMONARY / CRITICAL CARE MEDICINE   Name: Scott Rivera MRN: [id number] DOB: 27-Nov-1950    ADMISSION DATE:10/19/2016 CONSULTATION DATE:10/29/16  CHIEF COMPLAINT:Respiratory distress  HISTORY OF PRESENT ILLNESS: 66yoM with HTN, CKD, Anemia, brought initially to a community hospital on 7/1 with confusion after being found down on the floor. MRI brain on admission showed multiple acute infarcts within both hemispheres of cerebellum and right occipital lobe right midbrain and left thalamus. CXR at that time also showed multifocal pneumonia. He initially had rhabdomyolysis and AKI, both of which resolved. Family then requested transfer to [facility] on 10/19/16. PEG was placed on 10/27/16. He completed a 7 day course of Cefepime then transitioned to Augmentin. Then overnight patient aspirated and developed respiratory distress with RR in 40's with worsening acute hypoxic respiratory failure, precipitating emergent transfer to ICU for intubation. Intubation complicated by large emesis with aspiration during the procedure. PEG placed to suction and large volume of gastric secretions removed.   SUBJECTIVE: Patient had large BM yesterday after soap suds enema and was extubated and subsequently had RR 40s-50s and vomited emesis with stool particles. Required rapid reintubation and currently sedated on propofol drip at 85mcg.  VITAL SIGNS: BP 121/75   Pulse 78   Temp 100 F (37.8 C) (Axillary)   Resp (!) 24   Ht 5\' 11"  (1.803 m)   Wt 173 lb 11.6 oz (78.8 kg)   SpO2 100%   BMI 24.23 kg/m   HEMODYNAMICS:    VENTILATOR SETTINGS: Vent Mode: PRVC FiO2 (%):  [30 %-60 %] 60 % Set Rate:  [20 bmp] 20 bmp Vt Set:  [600 mL] 600 mL PEEP:  [5 cmH20] 5 cmH20 Pressure Support:  [5 cmH20] 5 cmH20 Plateau Pressure:  [25 cmH20-26 cmH20] 25 cmH20  INTAKE / OUTPUT: I/O last 3 completed shifts: In: 2521.5 [I.V.:2054.1; NG/GT:317.4; IV Piggyback:150] Out: 2627 [Urine:2275; Emesis/NG output:1;  Drains:350; Stool:1]  PHYSICAL EXAMINATION: Gen: 66yo M appearing comfortably sedated and in NAD HEENT: PERRL, MMM Lungs: CTABL CV: RRR, no MRG Abd: + BS, NTND Ext:no edema Skin: warm and dry, no rashes Neuro: sedated, opens eyes to voice, does not follow commands  LABS:  BMET  Recent Labs Lab 10/31/16 0150 11/01/16 0450 11/02/16 0416  NA 137 139 142  K 3.9 3.6 3.5  CL 109 111 112*  CO2 20* 22 22  BUN 24* 22* 17  CREATININE 1.69* 1.39* 1.46*  GLUCOSE 134* 101* 95    Electrolytes  Recent Labs Lab 10/29/16 1740 10/30/16 0419 10/31/16 0150 11/01/16 0450 11/02/16 0416  CALCIUM 8.6* 8.8* 9.0 9.1 8.9  MG 1.9 1.9  --  1.9  --   PHOS 4.0 3.7  --  3.2  --     CBC  Recent Labs Lab 10/31/16 0150 10/31/16 0721 11/01/16 0450 11/02/16 0416  WBC 32.4*  --  21.8* 22.4*  HGB 6.6* 8.3* 8.1* 8.3*  HCT 20.5* 24.4* 24.6* 25.3*  PLT 546*  --  610* 598*    Coag's  Recent Labs Lab 10/29/16 0126  INR 1.11    Sepsis Markers  Recent Labs Lab 10/29/16 0423 10/29/16 1739 10/30/16 0030  10/31/16 0150 11/01/16 0450 11/02/16 0416  LATICACIDVEN 4.4* 2.3* 1.1  --   --   --   --   PROCALCITON  --   --   --   < > 83.34 46.97 28.36  < > = values in this interval not displayed.  ABG  Recent Labs Lab 10/29/16 0047 10/29/16 0905  PHART  7.347* 7.422  PCO2ART 55.1* 33.8  PO2ART 306.0* 88.0    Liver Enzymes  Recent Labs Lab 10/27/16 0359 10/29/16 0126  AST 63* 43*  ALT 84* 58  ALKPHOS 214* 186*  BILITOT 0.8 0.8  ALBUMIN 1.9* 1.8*    Cardiac Enzymes  Recent Labs Lab 10/29/16 0126  TROPONINI <0.03    Glucose  Recent Labs Lab 11/01/16 0758 11/01/16 1143 11/01/16 1708 11/01/16 1939 11/01/16 2320 11/02/16 0326  GLUCAP 96 119* 107* 82 75 88    Imaging Dg Chest Port 1 View  Result Date: 11/01/2016 CLINICAL DATA:  Intubation and bronchoscopy. EXAM: PORTABLE CHEST 1 VIEW COMPARISON:  One-view chest x-ray from the same day for the T6 a.m.  FINDINGS: Endotracheal tube terminates 3 cm from the carina. A right subclavian line terminates at the cavoatrial junction. Surgical clips are present in the left breast. The heart is mildly enlarged. Edema is stable. Bilateral pleural effusions are suspected. Bibasilar airspace disease likely reflects atelectasis. IMPRESSION: 1. Stable and satisfactory positioning of support apparatus. 2. Stable mild cardiac enlargement with edema and bilateral effusions. 3. Bilateral lower lobe airspace disease reflecting atelectasis or lobar pneumonia. Electronically Signed   By: San Morelle M.D.   On: 11/01/2016 16:20     STUDIES:  CULTURES: Bcx 7/15 > Resp cx 7/15 >  ANTIBIOTICS: Cefepime 7/1-7/6 Augmentin 7/12-7/15 Zosyn 7/15> Vanco 7/15>7/17  SIGNIFICANT EVENTS: 7/1 [place] with [place] 7/5- transfer to [facility] 7/15- Aspirated, intubated 7/15: central line placed 7/17: hgb to 6.7, received1U PRBC 7/18: extubated and then reintubated  LINES/TUBES: PEG 7/13> ETT 7/15 > CVC triple lumen R subclavian 7/15>>  DISCUSSION: [id number] with HTN, CKD, Anemia, brought initially to a community hospital on 7/1 with confusion after being found down on the floor. Found to have multiple ischemic CVA's as well as multifocal pneumonia treated with cefepime>augmentin. Now with aspiration PNA with stable CXR. Extubated yesterday and required reintubation 2/2 respiratory distress.  Poor likelihood of tolerating extubation. Best approach to care would be early trach and rehab. Will discuss with sister.   ASSESSMENT / PLAN:  PULMONARY A: Acute resp failure  Reccurent aspiration P: Weaning 10/5 Discuss plan with sister and offer trach  VAP prevention See ID  CARDIOVASCULAR A:  Septic shock, resolved Atrial septal aneurysm, TEE r/o embolic source P: disontinue D5 when tube feeds resume prevastatin 20mg   RENAL A:  AKI, improving P: Follow urine output and  Cr  GASTROINTESTINAL A:  Possible gastroparesis, ileus causing aspiraton No BM 7/17 P: Resume tube feeds via PEG BM 7/18 Continue pepcid Colace 100mg  BID   HEMATOLOGIC A:  Stable leukocytosis 21.8>22.4 Anemia, stable at 8.1>8.3 P: Follow CBC See ID  INFECTIOUS A:  Severe sepsis secondary to aspiration Procalcitonin trending downward 46.9>28 Leukocytosis stable P: Cont zosyn Cultures pending  Follow Pct   Trend WBC  ENDOCRINE A:  No acute issues P: Monitor CBGs  NEUROLOGIC A:  Multiple CVA, intubated and sedated P: Wean propofol and consider  Neuro following; appreciate assistance  -DAPT for 61mo then plavix alone On plavix Daily SBT Plan for loop recorder  May require trach   FAMILY - Updates: No family at bedside - Inter-disciplinary family meet or Palliative Care meeting due by: 7/22    Pulmonary and [place] Pager: [phone]  11/02/2016, 7:22 AM

## 2016-11-02 NOTE — Progress Notes (Addendum)
Nutrition Follow-up  DOCUMENTATION CODES:   Severe malnutrition in context of acute illness/injury  INTERVENTION:   Resume TF:  Vital AF 1.2 at 55 ml/h (1320 ml per day)  Pro-stat 30 ml once daily  Provides 1684 kcal, 114 gm protein, 1071 ml free water daily  Total intake 2062 kcal with Propofol  NUTRITION DIAGNOSIS:   Malnutrition related to acute illness as evidenced by severe depletion of body fat, severe depletion of muscle mass, energy intake < or equal to 50% for > or equal to 5 days.  Ongoing  GOAL:   Patient will meet greater than or equal to 90% of their needs   Unmet   MONITOR:   Labs, Weight trends, TF tolerance, Skin, I & O's  REASON FOR ASSESSMENT:   Consult Enteral/tube feeding initiation and management  ASSESSMENT:   66 yo male with PMH of HTN, stroke who [facility] after family found him on the floor confused. MRI showed multiple acute infarcts. He was [facility] to [facility] on 7/5 at family's request.   Discussed patient in ICU rounds and with RN today. Patient was extubated on 7/18, but required re-intubation. Family is deciding on whether to proceed with trach or not.  PEG in place. TF has been resumed. RD to adjust TF as needed to meet nutrition needs. Receiving 200 ml free water QID.  Patient is currently intubated on ventilator support MV: 10.4 L/min Temp (24hrs), Avg:99.2 F (37.3 C), Min:97.8 F (36.6 C), Max:100.8 F (38.2 C)  Propofol: 14.3 ml/hr providing 378 kcal from fat Labs and medications reviewed.  Diet Order:  Diet - low sodium heart healthy Diet NPO time specified  Skin:  Wound (see comment) (stage II to R buttocks, L shoulder; DTI to heels)  Last BM:  7/19  Height:   Ht Readings from Last 1 Encounters:  10/19/16 5\' 11"  (1.803 m)    Weight:   Wt Readings from Last 1 Encounters:  11/02/16 173 lb 11.6 oz (78.8 kg)    Ideal Body Weight:  78.2 kg  BMI:  Body mass index is 24.23  kg/m.  Estimated Nutritional Needs:   Kcal:  2020  Protein:  110-125 gm  Fluid:  > 2 L  EDUCATION NEEDS:   No education needs identified at this time  Molli Barrows, [place], [place], [place] Pager [phone] After Hours Pager [phone]

## 2016-11-02 NOTE — Progress Notes (Signed)
PT Cancellation Note  Patient Details Name: Scott Rivera MRN: [id number] DOB: December 02, 1950   Cancelled Treatment:    Reason Eval/Treat Not Completed: Medical issues which prohibited therapy (Pt reintubated early am after PT ordered. Nsg said sign off. )Please reorder if and when pt is appropriate.  Thanks.    Godfrey Pick Teila Skalsky 11/02/2016, 9:20 AM [facility] [phone] (pager)

## 2016-11-02 NOTE — Progress Notes (Signed)
STROKE TEAM PROGRESS NOTE   SUBJECTIVE (INTERVAL HISTORY) RNs are at bedside giving pt a bath. He was re-intubated yesterday after extubation and not tolerating well. Currently pt eyes open, awake, comfortably with ET tube. Not following commands but moving all extremities.   OBJECTIVE Temp:  [97.8 F (36.6 C)-100.8 F (38.2 C)] 98.4 F (36.9 C) (07/19 1128) Pulse Rate:  [78-127] 94 (07/19 1216) Cardiac Rhythm: Normal sinus rhythm (07/19 0821) Resp:  [20-45] 26 (07/19 1216) BP: (98-169)/(51-96) 125/59 (07/19 1216) SpO2:  [60 %-100 %] 100 % (07/19 1216) FiO2 (%):  [50 %-60 %] 50 % (07/19 1216) Weight:  [173 lb 11.6 oz (78.8 kg)] 173 lb 11.6 oz (78.8 kg) (07/19 0420)  CBC:   Recent Labs Lab 10/29/16 0126  11/01/16 0450 11/02/16 0416  WBC 19.5*  < > 21.8* 22.4*  NEUTROABS 17.1*  --   --   --   HGB 9.2*  < > 8.1* 8.3*  HCT 29.1*  < > 24.6* 25.3*  MCV 88.4  < > 86.0 86.6  PLT 700*  < > 610* 598*  < > = values in this interval not displayed.  Basic Metabolic Panel:   Recent Labs Lab 10/30/16 0419  11/01/16 0450 11/02/16 0416  NA 139  < > 139 142  K 4.2  < > 3.6 3.5  CL 108  < > 111 112*  CO2 22  < > 22 22  GLUCOSE 85  < > 101* 95  BUN 30*  < > 22* 17  CREATININE 1.83*  < > 1.39* 1.46*  CALCIUM 8.8*  < > 9.1 8.9  MG 1.9  --  1.9  --   PHOS 3.7  --  3.2  --   < > = values in this interval not displayed.  Lipid Panel:     Component Value Date/Time   CHOL 92 10/21/2016 0253   TRIG 170 (H) 11/01/2016 1712   HDL 14 (L) 10/21/2016 0253   CHOLHDL 6.6 10/21/2016 0253   VLDL 21 10/21/2016 0253   LDLCALC 57 10/21/2016 0253   HgbA1c:  Lab Results  Component Value Date   HGBA1C 5.4 10/21/2016   Urine Drug Screen:     Component Value Date/Time   LABOPIA NONE DETECTED 10/19/2016 0050   COCAINSCRNUR NONE DETECTED 10/19/2016 0050   LABBENZ NONE DETECTED 10/19/2016 0050   AMPHETMU NONE DETECTED 10/19/2016 0050   THCU NONE DETECTED 10/19/2016 0050   LABBARB NONE  DETECTED 10/19/2016 0050    Alcohol Level No results found for: [place] I have personally reviewed the radiological images below and agree with the radiology interpretations.  Ct Angio Head W Or Wo Contrast Ct Angio Neck W Or Wo Contrast 10/20/2016 1. No acute arterial finding.  2. Advanced left and mild right V1 segment atheromatous stenosis. No visible ulcerated plaque.  3. Motion artifact from swallowing significantly degrades evaluation of the cervical carotids, especially on the left. No noted flow limiting stenosis.  4. Diminished number of left MCA branches in the setting of remote infarct.  5. No visible progression or hemorrhagic conversion of the acute posterior circulation infarcts.  6. Remote left MCA and ACA territory infarcts.   Dg Chest Port 1 View 10/19/2016 Increasing volume loss in the left hemithorax with progressive left lung opacities. Suspect aspiration/mucous plugging versus increasing pneumonia.   MRI brian - b/l cerebellar L>R, right pons, right midbrain, right PCA territory and left thalamus infarcts.   TTE  Left ventricle: The cavity size  was normal. Wall thickness was   increased in a pattern of mild LVH. Systolic function was normal.   The estimated ejection fraction was in the range of 60% to 65%.   Wall motion was normal; there were no regional wall motion   abnormalities. - Atrial septum: The interatrial septum was hypermobile. A patent   foramen ovale cannot be excluded. 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 - Recommendations: Consider transesophageal echocardiography if   clinically indicated.  LE venous doppler  - No evidence of deep vein thrombosis involving the visualized   veins of the right lower extremity and left lower extremity. - No evidence of Baker&'s cyst on the right or left.  TEE - Left ventricle: The cavity size was normal. Wall thickness was   normal. Systolic function was normal. The estimated ejection    fraction was in the range of 60% to 65%. - Aortic valve: No evidence of vegetation. - Mitral valve: No evidence of vegetation. - Left atrium: No evidence of thrombus in the appendage. - Right atrium: No evidence of thrombus in the atrial cavity or   appendage. - Atrial septum: The septum bowed from left to right, consistent   with increased left atrial pressure. No defect or patent foramen   ovale was identified. Echo contrast study showed no right-to-left   atrial level shunt, following an increase in RA pressure induced   by provocative maneuvers. - Tricuspid valve: No evidence of vegetation. - Pulmonic valve: No evidence of vegetation.   PHYSICAL EXAM  Temp:  [97.8 F (36.6 C)-100.8 F (38.2 C)] 98.4 F (36.9 C) (07/19 1128) Pulse Rate:  [78-127] 94 (07/19 1216) Resp:  [20-45] 26 (07/19 1216) BP: (98-169)/(51-96) 125/59 (07/19 1216) SpO2:  [60 %-100 %] 100 % (07/19 1216) FiO2 (%):  [50 %-60 %] 50 % (07/19 1216) Weight:  [173 lb 11.6 oz (78.8 kg)] 173 lb 11.6 oz (78.8 kg) (07/19 0420)  General - thin, well developed, re-intubated on sedation  Ophthalmologic - Fundi not visualized due to noncooperation.  Cardiovascular - Regular rate and rhythm.  Neuro - intubated on sedation, eyes open, awake alert, not following commands, PERRL, blinking to visual threat bilaterally, tracking objects, attending to both sides, eyes moving to both directions. positive corneal and gag. Facial symmetry not able to assess due to ET tube. BUE spontaneous movement, on pain stimulation BLE mild withdraw symmetrically. DTR 1+ and no babinski. Sensation, coordination and gait not tested.   ASSESSMENT/PLAN Mr. TUFF CLABO is a 66 y.o. male with history of previous strokes, hypertension and ongoing tobacco use presenting with severe dysarthria. He did not receive IV t-PA due to late presentation.  Stroke:  Extensive b/l posterior cerebral infarcts, embolic pattern, etiology unclear   Resultant   Severe dysarthria and dysphagia  CT head evolving bilateral cerebellar, right midbrain and left thalamic infarcts  MRI head - OSH - extensive multiple acute infarcts b/l cerebellar, right pons, right midbrain, right PCA and left thalamus  CTA H&N - Right V1segment origin stenosis due to atheromatous plaque.   2D Echo - EF 60% to 65%. ASA present, but PFO not able to exclude  TEE - atrial septum bowed from left to right, consistent with increased left atrial pressure. No PFO  Loop recorder may be considered in the future.  LE venous doppler no DVT  LDL - 24  HgbA1c - 5.4  VTE prophylaxis - heparin subq Diet - low sodium heart healthy Diet NPO time specified  No antithrombotic prior to admission, now on aspirin 325 mg daily and plavix. Continue DAPT for 3 months and then plavix alone.   Patient counseled to be compliant with his antithrombotic medications  Ongoing aggressive stroke risk factor management  Therapy recommendations: pending  Disposition:  Pending  Respiratory distress  Likely due to aspiration   Re-intubated as not able to tolerating extubation  CCM on board plan for trach  Sepsis with aspiration pneumonia  off pressor, BP stable now  Off vanco and on zosyn  CCM on board  Leukocytosis 19.5 -> 29.1->32.4->21.8->22.4  Anemia   Hb 9.2->7.1->6.6->trasfusion->8.3->8.1->8.3  CBC monitoring  Hyperlipidemia  Home meds: Mevacor 20 mg daily not resumed in hospital  LDL 24, goal < 70  On pravastatin 20mg  now  Continue statin at discharge  Dysphagia   Severe dysarthria  Did not pass swallow  S/p PEG  TF restarted via PEG  Other Stroke Risk Factors  Advanced age  Tobacco abuse - cessation counsueling provided  ETOH use, advised to drink no more than 1 drink per day.  Hx stroke/TIA by imaging  Other Active Problems  Elevated creatinine 2.02->1.83->1.69->1.39  Hospital day # 14   Rosalin Hawking, MD PhD Stroke  Neurology 11/02/2016 1:10 PM   To contact Stroke Continuity provider, please refer to [email or web address] After hours, contact General Neurology

## 2016-11-03 ENCOUNTER — Inpatient Hospital Stay (HOSPITAL_COMMUNITY): Payer: Medicare HMO

## 2016-11-03 LAB — CBC
HCT: 24.5 % — ABNORMAL LOW (ref 39.0–52.0)
Hemoglobin: 7.8 g/dL — ABNORMAL LOW (ref 13.0–17.0)
MCH: 27.9 pg (ref 26.0–34.0)
MCHC: 31.8 g/dL (ref 30.0–36.0)
MCV: 87.5 fL (ref 78.0–100.0)
Platelets: 565 10*3/uL — ABNORMAL HIGH (ref 150–400)
RBC: 2.8 MIL/uL — ABNORMAL LOW (ref 4.22–5.81)
RDW: 15.6 % — ABNORMAL HIGH (ref 11.5–15.5)
WBC: 19.3 10*3/uL — ABNORMAL HIGH (ref 4.0–10.5)

## 2016-11-03 LAB — APTT: aPTT: 43 seconds — ABNORMAL HIGH (ref 24–36)

## 2016-11-03 LAB — PROTIME-INR
INR: 1.21
Prothrombin Time: 15.3 seconds — ABNORMAL HIGH (ref 11.4–15.2)

## 2016-11-03 LAB — CULTURE, BLOOD (ROUTINE X 2)
Culture: NO GROWTH
Culture: NO GROWTH
Special Requests: ADEQUATE
Special Requests: ADEQUATE

## 2016-11-03 LAB — BASIC METABOLIC PANEL
Anion gap: 9 (ref 5–15)
BUN: 17 mg/dL (ref 6–20)
CO2: 22 mmol/L (ref 22–32)
Calcium: 8.8 mg/dL — ABNORMAL LOW (ref 8.9–10.3)
Chloride: 111 mmol/L (ref 101–111)
Creatinine, Ser: 1.4 mg/dL — ABNORMAL HIGH (ref 0.61–1.24)
GFR calc Af Amer: 59 mL/min — ABNORMAL LOW (ref >60)
GFR calc non Af Amer: 51 mL/min — ABNORMAL LOW (ref >60)
Glucose, Bld: 106 mg/dL — ABNORMAL HIGH (ref 65–99)
Potassium: 3 mmol/L — ABNORMAL LOW (ref 3.5–5.1)
Sodium: 142 mmol/L (ref 135–145)

## 2016-11-03 LAB — GLUCOSE, CAPILLARY
Glucose-Capillary: 101 mg/dL — ABNORMAL HIGH (ref 65–99)
Glucose-Capillary: 113 mg/dL — ABNORMAL HIGH (ref 65–99)
Glucose-Capillary: 104 mg/dL — ABNORMAL HIGH (ref 65–99)
Glucose-Capillary: 109 mg/dL — ABNORMAL HIGH (ref 65–99)
Glucose-Capillary: 116 mg/dL — ABNORMAL HIGH (ref 65–99)

## 2016-11-03 MED ORDER — ETOMIDATE 2 MG/ML IV SOLN
40.0000 mg | Freq: Once | INTRAVENOUS | Status: DC
  Filled 2016-11-03: qty 20

## 2016-11-03 MED ORDER — ROCURONIUM BROMIDE 50 MG/5ML IV SOLN
20.0000 mg | Freq: Once | INTRAVENOUS | Status: AC
  Administered 2016-11-03: 20 mg via INTRAVENOUS

## 2016-11-03 MED ORDER — MIDAZOLAM HCL 2 MG/2ML IJ SOLN
4.0000 mg | Freq: Once | INTRAMUSCULAR | Status: AC
  Administered 2016-11-03: 2 mg via INTRAVENOUS
  Filled 2016-11-03: qty 4

## 2016-11-03 MED ORDER — FENTANYL CITRATE (PF) 100 MCG/2ML IJ SOLN
200.0000 ug | Freq: Once | INTRAMUSCULAR | Status: AC
  Administered 2016-11-03: 200 ug via INTRAVENOUS
  Filled 2016-11-03: qty 4

## 2016-11-03 MED ORDER — POTASSIUM CHLORIDE 20 MEQ/15ML (10%) PO SOLN
30.0000 meq | ORAL | Status: AC
  Administered 2016-11-03 (×2): 30 meq
  Filled 2016-11-03 (×2): qty 30

## 2016-11-03 MED ORDER — PROPOFOL 500 MG/50ML IV EMUL: 5.0000 ug/kg/min | Freq: Once | INTRAVENOUS | Status: AC

## 2016-11-03 MED ORDER — ROCURONIUM BROMIDE 50 MG/5ML IV SOLN: 1.0000 mg/kg | Freq: Once | INTRAVENOUS | Status: DC

## 2016-11-03 MED ORDER — VECURONIUM BROMIDE 10 MG IV SOLR
10.0000 mg | Freq: Once | INTRAVENOUS | Status: DC
  Filled 2016-11-03 (×2): qty 10

## 2016-11-03 NOTE — Procedures (Signed)
Name:  [name] MRN:  [id number] DOB:  [birth year]  OPERATIVE NOTE  Procedure:  Percutaneous tracheostomy.  Indications:  Ventilator-dependent respiratory failure.  Consent:  Procedure, alternatives, risks and benefits discussed with medical POA.  Questions answered.  Consent obtained.  Anesthesia:  Roc, prop, versed,f ent  Procedure summary:  Appropriate equipment was assembled.  The patient was identified as Scott Rivera and safety timeout was performed. The patient was placed in supine position with a towel roll behind shoulder blades and neck extended.  Sterile technique was used. The patient's neck and upper chest were prepped using chlorhexidine / alcohol scrub and the field was draped in usual sterile fashion with full body drape. After the adequate sedation / anesthesia was achieved, attention was directed at the midline trachea, where the cricothyroid membrane was palpated. Approximately two fingerbreadths above the sternal notch, a verticle  incision was created with a scalpel after local infiltration with 0.2% Lidocaine. Then, using Seldinger technique and a percutaneous tracheostomy set, the trachea was entered with a 14 gauge needle with an overlying sheath. This was all confirmed under direct visualization of a fiberoptic flexible bronchoscope. Entrance into the trachea was identified through the third tracheal ring interspace. Following this, a guidewire was inserted. The needle was removed, leaving the sheath and the guidewire intact. Next, the sheath was removed and a small dilator was inserted. The tracheal rings were then dilated. A #6 Shiley was then opened. The balloon was checked. It was placed over a tracheal dilator, which was then advanced over the guidewire and through the previously dilated tract. The Shiley tracheostomy tube was noted to pass in the trachea with little resistance. The guidewire and dilator tubes were removed from the trachea. An inner cannula was  placed through the tracheostomy tube. The tracheostomy was then secured at the anterior neck with 4 monofilament sutures. The oral endotracheal tube was removed and the ventilator was attached to the newly placed tracheostomy tube. Adequate tidal volumes were noted. The cuff was inflated and no evidence of air leak was noted. No evidence of bleeding was noted. At this point, the procedure was concluded. Post-procedure chest x-ray was ordered.  Complications:  No immediate complications were noted.  Hemodynamic parameters and oxygenation remained stable throughout the procedure.  Estimated blood loss:  Less then 1 mL.  Raylene Miyamoto., MD Pulmonary and [place] Pager: [phone]  11/03/2016, 12:25 PM  Should follow up in [facility] [phone]

## 2016-11-03 NOTE — Procedures (Signed)
Bedside Tracheostomy Insertion Procedure Note   Patient Details:   Name: Scott Rivera DOB: February 12, 1951 MRN: [id number]  Procedure: Tracheostomy  Pre Procedure Assessment: ET Tube Size: ET Tube secured at lip (cm): Bite block in place: Yes Breath Sounds: Clear and Diminished  Post Procedure Assessment: BP 132/77   Pulse 80   Temp 98.1 F (36.7 C) (Oral)   Resp (!) 25   Ht 5\' 11"  (1.803 m)   Wt 171 lb 15.3 oz (78 kg)   SpO2 100%   BMI 23.98 kg/m  O2 sats: stable throughout Complications: No apparent complications Patient did tolerate procedure well Tracheostomy Brand:Shiley Tracheostomy Style:Cuffed Tracheostomy Size: 6 Tracheostomy Secured TRZ:NBVAPOL Tracheostomy Placement Confirmation:Trach cuff visualized and in place and Chest X ray ordered for placement      Jesse Sans 11/03/2016, 12:32 PM

## 2016-11-03 NOTE — Care Management Note (Addendum)
Case Management Note  Patient Details  Name: Scott Rivera MRN: [id number] Date of Birth: [birth year]  Subjective/Objective:                    Action/Plan: Awaiting PEG placement for patient to be able to d/c to SNF. CM following.   Expected Discharge Date:  10/25/16               Expected Discharge Plan:  Skilled Nursing Facility  In-House Referral:  Clinical Social Work  Discharge planning Services  CM Consult  Post Acute Care Choice:  IP Rehab Choice offered to:     DME Arranged:    DME Agency:     HH Arranged:    [place] Agency:     Status of Service:  In process, will continue to follow  If discussed at Long Length of Stay Meetings, dates discussed:    Additional Comments: 11/03/2016  Trach planned for today.  CSW spoke with family regarding possible out of state placement if pt doesn't wean quickly.  Attending now saying pt may require extended time for weaning - heavy secretions.  CM discussed with physician advisor - pt is not yet appropriate for [facility] referral - will like to wait and see how pt weans - will revisit next week  Pt will need trach - per attending pt will likely wean quickly so will not likely be LTACH appropriate.  CM will continue to follow for discharge needs - CSW aware of possible placement need dependent upon progression  11/02/2016 Pt extubated today - however already requiring NT suctioning.  CSW following for placement  10/31/16 Pt transferred to ICU post aspiration on the unit requiring ventilation.   Pt is on precedex and not tolerating weaning - not following commands.  CSW continuing to follow   Maryclare Labrador, RN 11/03/2016, 10:07 AM

## 2016-11-03 NOTE — Progress Notes (Signed)
[facility] ADULT ICU REPLACEMENT PROTOCOL FOR AM LAB REPLACEMENT ONLY  The patient does apply for the [facility] Adult ICU Electrolyte Replacment Protocol based on the criteria listed below:   1. Is GFR >/= 40 ml/min? Yes.    Patient's GFR today is 59 2. Is urine output >/= 0.5 ml/kg/hr for the last 6 hours? Yes.   Patient's UOP is 1.4 ml/kg/hr 3. Is BUN < 60 mg/dL? Yes.    Patient's BUN today is 17 4. Abnormal electrolyte(s): K3.0 5. Ordered repletion with: per protocol 6. If a panic level lab has been reported, has the CCM MD in charge been notified? Yes.  .   Physician:  Patricia Nettle, MD  [facility] 11/03/2016 6:05 AM

## 2016-11-03 NOTE — Procedures (Addendum)
Bronchoscopy Procedure Note Scott Rivera [id number] 22-Feb-1951  Procedure: Bronchoscopy Indications: Remove secretions and provide visualisation for perc trach  Procedure Details Consent: Risks of procedure as well as the alternatives and risks of each were explained to the (patient/caregiver).  Consent for procedure obtained. Time Out: Verified patient identification, verified procedure, site/side was marked, verified correct patient position, special equipment/implants available, medications/allergies/relevent history reviewed, required imaging and test results available.  Performed  In preparation for procedure, patient was given 100% FiO2 and bronchoscope lubricated. Sedation: Benzodiazepines, Muscle relaxants and Etomidate  Airway entered and the following bronchi were examined: Bronchi.   Procedures performed: visualisation as ETT withdrawn, puncture & dilation per formed, then through tstomy to see carina & confirm position Bronchoscope removed.  , Patient placed back on 100% FiO2 at conclusion of procedure.    Evaluation Hemodynamic Status: BP stable throughout; O2 sats: stable throughout Patient's Current Condition: stable Specimens:  None Complications: No apparent complications Patient did tolerate procedure well.   Scott Rivera V. 11/03/2016

## 2016-11-03 NOTE — Progress Notes (Addendum)
STROKE TEAM PROGRESS NOTE   SUBJECTIVE (INTERVAL HISTORY) RN is at bedside. He still intubated and on sedation. More sleepy and drowsy than yesterday, not following commands. Copious secretions and needed frequent suctioning. CCM is planning to do trach at bedside today.    OBJECTIVE Temp:  [98.1 F (36.7 C)-99 F (37.2 C)] 98.1 F (36.7 C) (07/20 1139) Pulse Rate:  [80-108] 81 (07/20 1300) Cardiac Rhythm: Normal sinus rhythm (07/20 1200) Resp:  [18-32] 18 (07/20 1300) BP: (117-161)/(54-77) 125/64 (07/20 1300) SpO2:  [99 %-100 %] 100 % (07/20 1300) FiO2 (%):  [35 %-40 %] 40 % (07/20 1200) Weight:  [171 lb 15.3 oz (78 kg)] 171 lb 15.3 oz (78 kg) (07/20 0408)  CBC:   Recent Labs Lab 10/29/16 0126  11/02/16 0416 11/03/16 0411  WBC 19.5*  < > 22.4* 19.3*  NEUTROABS 17.1*  --   --   --   HGB 9.2*  < > 8.3* 7.8*  HCT 29.1*  < > 25.3* 24.5*  MCV 88.4  < > 86.6 87.5  PLT 700*  < > 598* 565*  < > = values in this interval not displayed.  Basic Metabolic Panel:   Recent Labs Lab 10/30/16 0419  11/01/16 0450 11/02/16 0416 11/03/16 0411  NA 139  < > 139 142 142  K 4.2  < > 3.6 3.5 3.0*  CL 108  < > 111 112* 111  CO2 22  < > 22 22 22   GLUCOSE 85  < > 101* 95 106*  BUN 30*  < > 22* 17 17  CREATININE 1.83*  < > 1.39* 1.46* 1.40*  CALCIUM 8.8*  < > 9.1 8.9 8.8*  MG 1.9  --  1.9  --   --   PHOS 3.7  --  3.2  --   --   < > = values in this interval not displayed.  Lipid Panel:     Component Value Date/Time   CHOL 92 10/21/2016 0253   TRIG 170 (H) 11/01/2016 1712   HDL 14 (L) 10/21/2016 0253   CHOLHDL 6.6 10/21/2016 0253   VLDL 21 10/21/2016 0253   LDLCALC 57 10/21/2016 0253   HgbA1c:  Lab Results  Component Value Date   HGBA1C 5.4 10/21/2016   Urine Drug Screen:     Component Value Date/Time   LABOPIA NONE DETECTED 10/19/2016 0050   COCAINSCRNUR NONE DETECTED 10/19/2016 0050   LABBENZ NONE DETECTED 10/19/2016 0050   AMPHETMU NONE DETECTED 10/19/2016 0050   THCU NONE DETECTED 10/19/2016 0050   LABBARB NONE DETECTED 10/19/2016 0050    Alcohol Level No results found for: [place] I have personally reviewed the radiological images below and agree with the radiology interpretations.  Ct Angio Head W Or Wo Contrast Ct Angio Neck W Or Wo Contrast 10/20/2016 1. No acute arterial finding.  2. Advanced left and mild right V1 segment atheromatous stenosis. No visible ulcerated plaque.  3. Motion artifact from swallowing significantly degrades evaluation of the cervical carotids, especially on the left. No noted flow limiting stenosis.  4. Diminished number of left MCA branches in the setting of remote infarct.  5. No visible progression or hemorrhagic conversion of the acute posterior circulation infarcts.  6. Remote left MCA and ACA territory infarcts.   Dg Chest Port 1 View 10/19/2016 Increasing volume loss in the left hemithorax with progressive left lung opacities. Suspect aspiration/mucous plugging versus increasing pneumonia.   MRI brian - b/l cerebellar L>R, right pons, right midbrain,  right PCA territory and left thalamus infarcts.   TTE  Left ventricle: The cavity size was normal. Wall thickness was   increased in a pattern of mild LVH. Systolic function was normal.   The estimated ejection fraction was in the range of 60% to 65%.   Wall motion was normal; there were no regional wall motion   abnormalities. - Atrial septum: The interatrial septum was hypermobile. A patent   foramen ovale cannot be excluded. 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 - Recommendations: Consider transesophageal echocardiography if   clinically indicated.  LE venous doppler  - No evidence of deep vein thrombosis involving the visualized   veins of the right lower extremity and left lower extremity. - No evidence of Baker&'s cyst on the right or left.  TEE - Left ventricle: The cavity size was normal. Wall thickness was   normal. Systolic  function was normal. The estimated ejection   fraction was in the range of 60% to 65%. - Aortic valve: No evidence of vegetation. - Mitral valve: No evidence of vegetation. - Left atrium: No evidence of thrombus in the appendage. - Right atrium: No evidence of thrombus in the atrial cavity or   appendage. - Atrial septum: The septum bowed from left to right, consistent   with increased left atrial pressure. No defect or patent foramen   ovale was identified. Echo contrast study showed no right-to-left   atrial level shunt, following an increase in RA pressure induced   by provocative maneuvers. - Tricuspid valve: No evidence of vegetation. - Pulmonic valve: No evidence of vegetation.   PHYSICAL EXAM  Temp:  [98.1 F (36.7 C)-99 F (37.2 C)] 98.1 F (36.7 C) (07/20 1139) Pulse Rate:  [80-108] 81 (07/20 1300) Resp:  [18-32] 18 (07/20 1300) BP: (117-161)/(54-77) 125/64 (07/20 1300) SpO2:  [99 %-100 %] 100 % (07/20 1300) FiO2 (%):  [35 %-40 %] 40 % (07/20 1200) Weight:  [171 lb 15.3 oz (78 kg)] 171 lb 15.3 oz (78 kg) (07/20 0408)  General - thin, well developed, re-intubated on sedation  Ophthalmologic - Fundi not visualized due to noncooperation.  Cardiovascular - Regular rate and rhythm.  Neuro - intubated on sedation, drowsy, able to open eyes on voice, not following commands, PERRL, blinking to visual threat bilaterally, tracking objects, attending to both sides, eyes moving to both directions. positive corneal and gag. Facial symmetry not able to assess due to ET tube. On pain stimulation, BUE localizing and BLE mild withdraw symmetrically. DTR 1+ and no babinski. Sensation, coordination and gait not tested.   ASSESSMENT/PLAN Scott Rivera is a 66 y.o. male with history of previous strokes, hypertension and ongoing tobacco use presenting with severe dysarthria. He did not receive IV t-PA due to late presentation.  Stroke:  Extensive b/l posterior cerebral infarcts,  embolic pattern, etiology unclear   Resultant  Severe dysarthria and dysphagia  CT head evolving bilateral cerebellar, right midbrain and left thalamic infarcts  MRI head - OSH - extensive multiple acute infarcts b/l cerebellar, right pons, right midbrain, right PCA and left thalamus  CTA H&N - Right V1segment origin stenosis due to atheromatous plaque.   2D Echo - EF 60% to 65%. ASA present, but PFO not able to exclude  TEE - atrial septum bowed from left to right, consistent with increased left atrial pressure. No PFO  Loop recorder may be considered in the future if pt has significant improvement.  LE venous doppler no DVT  LDL - 24  HgbA1c - 5.4  VTE prophylaxis - heparin subq Diet - low sodium heart healthy Diet NPO time specified Diet NPO time specified  No antithrombotic prior to admission, now on aspirin 325 mg daily and plavix. Continue DAPT for 3 months and then plavix alone.   Patient counseled to be compliant with his antithrombotic medications  Ongoing aggressive stroke risk factor management  Therapy recommendations: pending  Disposition:  Pending  Respiratory distress  Likely due to aspiration   Re-intubated as not able to tolerating extubation  CCM on board plan for trach today  Sepsis with aspiration pneumonia  off pressor, BP stable now  Off vanco and on zosyn  CCM on board  Leukocytosis 19.5 -> 29.1->32.4->21.8->22.4->19.3  Anemia   Hb 9.2->7.1->6.6->trasfusion->8.3->8.1->8.3->7.8  CBC monitoring  Hyperlipidemia  Home meds: Mevacor 20 mg daily not resumed in hospital  LDL 24, goal < 70  On pravastatin 20mg  now  Continue statin at discharge  Dysphagia   Severe dysarthria  Did not pass swallow  S/p PEG  TF restarted via PEG  Other Stroke Risk Factors  Advanced age  Tobacco abuse - cessation counsueling provided  ETOH use, advised to drink no more than 1 drink per day.  Hx stroke/TIA by imaging  Other Active  Problems  Elevated creatinine 2.02->1.83->1.69->1.39->1.40  Hospital day # 15  Currently, neurology has nothing new to add. Pt needs LTAC for placement after trach. if he gets significant improvement, we will consider loop recorder to further stroke work up . Neurology will sign off for now. Please call with questions. Pt will follow up with Dr. Leonie Man at [facility] in about 6-8 weeks. Thanks for the consult.  Rosalin Hawking, MD PhD Stroke Neurology 11/03/2016 2:04 PM   To contact Stroke Continuity provider, please refer to [email or web address] After hours, contact General Neurology

## 2016-11-03 NOTE — Progress Notes (Signed)
PULMONARY / CRITICAL CARE MEDICINE   Name: ARAD BURSTON MRN: [id number] DOB: 25-Apr-1950    ADMISSION DATE:10/19/2016 CONSULTATION DATE:10/29/16  CHIEF COMPLAINT:Respiratory distress  HISTORY OF PRESENT ILLNESS: 66yoM with HTN, CKD, Anemia, brought initially to a community hospital on 7/1 with confusion after being found down on the floor. MRI brain on admission showed multiple acute infarcts within both hemispheres of cerebellum and right occipital lobe right midbrain and left thalamus. CXR at that time also showed multifocal pneumonia. He initially had rhabdomyolysis and AKI, both of which resolved. Family then requested transfer to [facility] on 10/19/16. PEG was placed on 10/27/16. He completed a 7 day course of Cefepime then transitioned to Augmentin. Then overnight patient aspirated and developed respiratory distress with RR in 40's with worsening acute hypoxic respiratory failure, precipitating emergent transfer to ICU for intubation. Intubation complicated by large emesis with aspiration during the procedure. PEG placed to suction and large volume of gastric secretions removed.   SUBJECTIVE: No acute overnight events. Plan for trach 11/03/16. Propofol @ 30   VITAL SIGNS: BP (!) 157/72   Pulse 92   Temp 99 F (37.2 C) (Oral)   Resp 20   Ht 5\' 11"  (1.803 m)   Wt 171 lb 15.3 oz (78 kg)   SpO2 100%   BMI 23.98 kg/m   HEMODYNAMICS:    VENTILATOR SETTINGS: Vent Mode: PRVC FiO2 (%):  [40 %-50 %] 40 % Set Rate:  [20 bmp] 20 bmp Vt Set:  [600 mL] 600 mL PEEP:  [5 cmH20] 5 cmH20 Pressure Support:  [10 cmH20] 10 cmH20 Plateau Pressure:  [22 cmH20-25 cmH20] 25 cmH20  INTAKE / OUTPUT: I/O last 3 completed shifts: In: 2617.3 [I.V.:1347.8; WU/JW:1191.4; IV Piggyback:175] Out: 2795 [Urine:2745; Drains:50]  PHYSICAL EXAMINATION: Gen: 66yo M appearing comfortably sedated and in NAD HEENT: PERRL, MMM Lungs: CTABL CV: RRR, no MRG Abd: + BS, NTND Ext:no edema Skin: warm  and dry, no rashes Neuro: sedated, opens eyes to voice, does not follow commands  LABS:  BMET  Recent Labs Lab 11/01/16 0450 11/02/16 0416 11/03/16 0411  NA 139 142 142  K 3.6 3.5 3.0*  CL 111 112* 111  CO2 22 22 22   BUN 22* 17 17  CREATININE 1.39* 1.46* 1.40*  GLUCOSE 101* 95 106*    Electrolytes  Recent Labs Lab 10/29/16 1740 10/30/16 0419  11/01/16 0450 11/02/16 0416 11/03/16 0411  CALCIUM 8.6* 8.8*  < > 9.1 8.9 8.8*  MG 1.9 1.9  --  1.9  --   --   PHOS 4.0 3.7  --  3.2  --   --   < > = values in this interval not displayed.  CBC  Recent Labs Lab 11/01/16 0450 11/02/16 0416 11/03/16 0411  WBC 21.8* 22.4* 19.3*  HGB 8.1* 8.3* 7.8*  HCT 24.6* 25.3* 24.5*  PLT 610* 598* 565*    Coag's  Recent Labs Lab 10/29/16 0126  INR 1.11    Sepsis Markers  Recent Labs Lab 10/29/16 0423 10/29/16 1739 10/30/16 0030  10/31/16 0150 11/01/16 0450 11/02/16 0416  LATICACIDVEN 4.4* 2.3* 1.1  --   --   --   --   PROCALCITON  --   --   --   < > 83.34 46.97 28.36  < > = values in this interval not displayed.  ABG  Recent Labs Lab 10/29/16 0047 10/29/16 0905  PHART 7.347* 7.422  PCO2ART 55.1* 33.8  PO2ART 306.0* 88.0    Liver Enzymes  Recent  Labs Lab 10/29/16 0126  AST 43*  ALT 58  ALKPHOS 186*  BILITOT 0.8  ALBUMIN 1.8*    Cardiac Enzymes  Recent Labs Lab 10/29/16 0126  TROPONINI <0.03    Glucose  Recent Labs Lab 11/02/16 0733 11/02/16 1121 11/02/16 1532 11/02/16 1939 11/02/16 2317 11/03/16 0312  GLUCAP 79 84 116* 90 93 101*    Imaging No results found.   DISCUSSION: 32yoM with HTN, CKD, Anemia, brought initially to a community hospital on 7/1 with confusion after being found down on the floor. Found to have multiple ischemic CVA's as well as multifocal pneumonia treated with cefepime>augmentin. Now with aspiration PNA with stable CXR. Extubated yesterday and required reintubation 2/2 respiratory distress.  Poor  likelihood of tolerating extubation. Plan for trach 7/20 and begin rehab process.   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 embolic source P: prevastatin 20mg  Loop recorder in outpatient   RENAL A:  AKI, improving P: Follow urine output and Cr  GASTROINTESTINAL A:  Tube feeds P: Resume tube feedsvia PEG Continue pepcid Colace 100mg  BID   HEMATOLOGIC A:  Improvingleukocytosis 22.4>19.3 Anemia, stable at 8.3>7.8 P: Follow CBC See ID  INFECTIOUS A:  Severe sepsis secondary to aspiration Procalcitonin trending downward 46.9>28 Leukocytosis improving P: Cont zosyn Cultures pending  Trend WBC  ENDOCRINE A:  No acute issues P: Monitor CBGs  NEUROLOGIC A:  Multiple CVA, intubated and sedated P: Neuro following; appreciate assistance  -DAPT for 103mo then plavix alone On plavix Daily SBT Plan for loop recorder    FAMILY - Updates: Updated sister who consented to trach for 7/20 - Inter-disciplinary family meet or Palliative Care meeting due by: 7/22   Pulmonary and [place] Pager: [phone]  11/03/2016, 7:01 AM

## 2016-11-04 LAB — GLUCOSE, CAPILLARY
Glucose-Capillary: 102 mg/dL — ABNORMAL HIGH (ref 65–99)
Glucose-Capillary: 108 mg/dL — ABNORMAL HIGH (ref 65–99)
Glucose-Capillary: 115 mg/dL — ABNORMAL HIGH (ref 65–99)
Glucose-Capillary: 118 mg/dL — ABNORMAL HIGH (ref 65–99)
Glucose-Capillary: 122 mg/dL — ABNORMAL HIGH (ref 65–99)
Glucose-Capillary: 126 mg/dL — ABNORMAL HIGH (ref 65–99)
Glucose-Capillary: 98 mg/dL (ref 65–99)

## 2016-11-04 LAB — CBC
HCT: 25.9 % — ABNORMAL LOW (ref 39.0–52.0)
Hemoglobin: 8.2 g/dL — ABNORMAL LOW (ref 13.0–17.0)
MCH: 27.8 pg (ref 26.0–34.0)
MCHC: 31.7 g/dL (ref 30.0–36.0)
MCV: 87.8 fL (ref 78.0–100.0)
Platelets: 593 10*3/uL — ABNORMAL HIGH (ref 150–400)
RBC: 2.95 MIL/uL — ABNORMAL LOW (ref 4.22–5.81)
RDW: 15.8 % — ABNORMAL HIGH (ref 11.5–15.5)
WBC: 17.6 10*3/uL — ABNORMAL HIGH (ref 4.0–10.5)

## 2016-11-04 LAB — BASIC METABOLIC PANEL
Anion gap: 8 (ref 5–15)
BUN: 17 mg/dL (ref 6–20)
CO2: 22 mmol/L (ref 22–32)
Calcium: 9.1 mg/dL (ref 8.9–10.3)
Chloride: 112 mmol/L — ABNORMAL HIGH (ref 101–111)
Creatinine, Ser: 1.31 mg/dL — ABNORMAL HIGH (ref 0.61–1.24)
GFR calc Af Amer: >60 mL/min (ref >60)
GFR calc non Af Amer: 55 mL/min — ABNORMAL LOW (ref >60)
Glucose, Bld: 116 mg/dL — ABNORMAL HIGH (ref 65–99)
Potassium: 4.2 mmol/L (ref 3.5–5.1)
Sodium: 142 mmol/L (ref 135–145)

## 2016-11-04 LAB — CULTURE, BAL-QUANTITATIVE W GRAM STAIN: Culture: 60000 — AB

## 2016-11-04 LAB — TRIGLYCERIDES: Triglycerides: 115 mg/dL (ref <150)

## 2016-11-04 LAB — MAGNESIUM: Magnesium: 1.9 mg/dL (ref 1.7–2.4)

## 2016-11-04 LAB — PHOSPHORUS: Phosphorus: 3.2 mg/dL (ref 2.5–4.6)

## 2016-11-04 MED ORDER — CHLORHEXIDINE GLUCONATE CLOTH 2 % EX PADS
6.0000 | MEDICATED_PAD | Freq: Every day | CUTANEOUS | Status: DC
  Administered 2016-11-05 – 2016-11-06 (×2): 6 via TOPICAL

## 2016-11-04 MED ORDER — SODIUM CHLORIDE 0.9 % IV SOLN
0.4000 ug/kg/h | INTRAVENOUS | Status: DC
  Administered 2016-11-04 (×3): 0.4 ug/kg/h via INTRAVENOUS
  Filled 2016-11-04 (×3): qty 2

## 2016-11-04 MED ORDER — AMLODIPINE 1 MG/ML ORAL SUSPENSION
10.0000 mg | Freq: Every day | ORAL | Status: DC
  Administered 2016-11-04 – 2016-11-06 (×3): 10 mg
  Filled 2016-11-04 (×3): qty 10

## 2016-11-04 MED ORDER — AMLODIPINE 1 MG/ML ORAL SUSPENSION
10.0000 mg | Freq: Every day | ORAL | Status: DC
  Filled 2016-11-04 (×2): qty 10

## 2016-11-04 MED ORDER — DEXMEDETOMIDINE HCL IN NACL 400 MCG/100ML IV SOLN
0.4000 ug/kg/h | INTRAVENOUS | Status: DC
  Administered 2016-11-05: 0.5 ug/kg/h via INTRAVENOUS
  Filled 2016-11-04: qty 100

## 2016-11-04 NOTE — Progress Notes (Signed)
Pt placed on 40% ATC after weaning on 5/5 for 1 hr.  Pt is tolerating well.  Sats 99%, HR 105, RR 28.  RT will continue to monitor.

## 2016-11-04 NOTE — Progress Notes (Signed)
PULMONARY / CRITICAL CARE MEDICINE   Name: Scott Rivera MRN: [id number] DOB: 12-Dec-1950    ADMISSION DATE:10/19/2016 CONSULTATION DATE:10/29/16  CHIEF COMPLAINT:Respiratory distress  HISTORY OF PRESENT ILLNESS: 66 year old man admitted 7/1to outside hospital after being found down with multiple PCA infarcts in both cerebellum, right occipital lobe, right mid brain and left thalamus, transferred to [facility] on 7/5, rhabdomyolysis resolved, treated for aspiration with 7 days of antibiotics, had severe dysarthria &dysphagia requiring PEG 7/13 and had another aspiration episode 7/15 requiring mechanical ventilation. Unfortunately, failed extubation on 7/18 and required reintubation   Trach 7/20 >>   SUBJECTIVE:  Trach uneventful Afebrile No obvious pain, on low dose propofol  VITAL SIGNS: BP (!) 186/113   Pulse (!) 114   Temp 98.9 F (37.2 C) (Oral)   Resp (!) 39   Ht 5\' 11"  (1.803 m)   Wt 167 lb 15.9 oz (76.2 kg)   SpO2 100%   BMI 23.43 kg/m   HEMODYNAMICS:    VENTILATOR SETTINGS: Vent Mode: PRVC FiO2 (%):  [30 %-40 %] 40 % Set Rate:  [20 bmp] 20 bmp Vt Set:  [600 mL] 600 mL PEEP:  [5 cmH20] 5 cmH20 Pressure Support:  [10 cmH20] 10 cmH20 Plateau Pressure:  [23 cmH20-27 cmH20] 24 cmH20  INTAKE / OUTPUT: I/O last 3 completed shifts: In: 3073.8 [I.V.:1013.8; Other:160; NG/GT:1650; IV Piggyback:250] Out: 5361 [Urine:3775]  PHYSICAL EXAMINATION: Gen: elderly, NAD  HEENT: PERRL, MMM Lungs: CTABL CV: RRR, no MRG Abd: + BS, NTND Ext:no edema Skin: warm and dry, no rashes Neuro: spont opens eyes, does not follow commands  LABS:  BMET  Recent Labs Lab 11/02/16 0416 11/03/16 0411 11/04/16 0251  NA 142 142 142  K 3.5 3.0* 4.2  CL 112* 111 112*  CO2 22 22 22   BUN 17 17 17   CREATININE 1.46* 1.40* 1.31*  GLUCOSE 95 106* 116*    Electrolytes  Recent Labs Lab 10/30/16 0419  11/01/16 0450 11/02/16 0416 11/03/16 0411 11/04/16 0251  CALCIUM  8.8*  < > 9.1 8.9 8.8* 9.1  MG 1.9  --  1.9  --   --  1.9  PHOS 3.7  --  3.2  --   --  3.2  < > = values in this interval not displayed.  CBC  Recent Labs Lab 11/02/16 0416 11/03/16 0411 11/04/16 0251  WBC 22.4* 19.3* 17.6*  HGB 8.3* 7.8* 8.2*  HCT 25.3* 24.5* 25.9*  PLT 598* 565* 593*    Coag's  Recent Labs Lab 10/29/16 0126 11/03/16 0846  APTT  --  43*  INR 1.11 1.21    Sepsis Markers  Recent Labs Lab 10/29/16 0423 10/29/16 1739 10/30/16 0030  10/31/16 0150 11/01/16 0450 11/02/16 0416  LATICACIDVEN 4.4* 2.3* 1.1  --   --   --   --   PROCALCITON  --   --   --   < > 83.34 46.97 28.36  < > = values in this interval not displayed.  ABG  Recent Labs Lab 10/29/16 0047 10/29/16 0905  PHART 7.347* 7.422  PCO2ART 55.1* 33.8  PO2ART 306.0* 88.0    Liver Enzymes  Recent Labs Lab 10/29/16 0126  AST 43*  ALT 58  ALKPHOS 186*  BILITOT 0.8  ALBUMIN 1.8*    Cardiac Enzymes  Recent Labs Lab 10/29/16 0126  TROPONINI <0.03    Glucose  Recent Labs Lab 11/03/16 1140 11/03/16 1542 11/03/16 2010 11/04/16 0021 11/04/16 0421 11/04/16 0817  GLUCAP 104* 109* 116* 102* 118* 108*  Imaging Dg Chest Port 1 View  Result Date: 11/03/2016 CLINICAL DATA:  Tracheostomy placement . EXAM: PORTABLE CHEST 1 VIEW COMPARISON:  11/02/2016 . FINDINGS: Tracheostomy tube noted in good anatomic position. Right PICC line noted with tip over cavoatrial junction. Heart size stable. Vascular persistent bibasilar infiltrates without interim change . Small bilateral pleural effusions. Surgical clips left upper chest. IMPRESSION: 1. Interim placement of tracheostomy tube, its tip is projected over the trachea. Right subclavian line in stable position. 2. Persistent bibasilar infiltrates, without interim change. Small left pleural effusion again noted. Electronically Signed   By: Marcello Moores  Register   On: 11/03/2016 13:09     DISCUSSION: Multiple ischemic CVA's (PCA territory)   with aspiration PNA & prolonged ventilation requiring trach  ASSESSMENT / PLAN:  PULMONARY A: Acute resp failure  Reccurent aspiration P: Aggressive wean with goal ATC daytime , expect cheyne stokes' breathing  CARDIOVASCULAR A:  Septic shock, resolved Atrial septal aneurysm, TEE ruled out embolic source Htn P: prevastatin 20mg  Loop recorder as outpatient Resume amlodipin   RENAL A:  AKI, improving P: Follow urine output and Cr  GASTROINTESTINAL A:  Protein calorie malnutrition  P: Resume tube feedsvia PEG Continue pepcid Colace 100mg  BID   HEMATOLOGIC A:  Improvingleukocytosis 22.4>18 Anemia, stable at 8.3>7.8 P: Follow CBC   INFECTIOUS A:  Severe sepsis secondary to aspiration Procalcitonin trending downward 46.9>28 Leukocytosis improving P: Cont zosyn x 8ds, stop 7/22 Trend WBC  ENDOCRINE A:  No acute issues P: Monitor CBGs  NEUROLOGIC A:  Extensive b/l posterior cerebral infarcts, embolic pattern -b/l cerebellar, right pons, right midbrain, right PCA and left thalamus TEE neg P: Neuro off  -DAPT for 41mo then plavix alone On plavix     FAMILY - Updates: Updated sister daily - Inter-disciplinary family meet or Palliative Care meeting due by: [place] done 7/19   My cc time x 55m  Kara Mead MD. [facility]. [place] Pulmonary & Critical care Pager [phone] If no response call [phone]    11/04/2016, 10:44 AM

## 2016-11-05 ENCOUNTER — Inpatient Hospital Stay (HOSPITAL_COMMUNITY): Payer: Medicare HMO

## 2016-11-05 DIAGNOSIS — Z930 Tracheostomy status: Secondary | ICD-10-CM

## 2016-11-05 LAB — CBC
HCT: 28.1 % — ABNORMAL LOW (ref 39.0–52.0)
Hemoglobin: 8.6 g/dL — ABNORMAL LOW (ref 13.0–17.0)
MCH: 28 pg (ref 26.0–34.0)
MCHC: 30.6 g/dL (ref 30.0–36.0)
MCV: 91.5 fL (ref 78.0–100.0)
Platelets: 400 10*3/uL (ref 150–400)
RBC: 3.07 MIL/uL — ABNORMAL LOW (ref 4.22–5.81)
RDW: 18.9 % — ABNORMAL HIGH (ref 11.5–15.5)
WBC: 18.1 10*3/uL — ABNORMAL HIGH (ref 4.0–10.5)

## 2016-11-05 LAB — BASIC METABOLIC PANEL
Anion gap: 7 (ref 5–15)
BUN: 16 mg/dL (ref 6–20)
CO2: 26 mmol/L (ref 22–32)
Calcium: 9.4 mg/dL (ref 8.9–10.3)
Chloride: 108 mmol/L (ref 101–111)
Creatinine, Ser: 1.06 mg/dL (ref 0.61–1.24)
GFR calc Af Amer: >60 mL/min (ref >60)
GFR calc non Af Amer: >60 mL/min (ref >60)
Glucose, Bld: 115 mg/dL — ABNORMAL HIGH (ref 65–99)
Potassium: 3.9 mmol/L (ref 3.5–5.1)
Sodium: 141 mmol/L (ref 135–145)

## 2016-11-05 LAB — GLUCOSE, CAPILLARY
Glucose-Capillary: 110 mg/dL — ABNORMAL HIGH (ref 65–99)
Glucose-Capillary: 112 mg/dL — ABNORMAL HIGH (ref 65–99)
Glucose-Capillary: 113 mg/dL — ABNORMAL HIGH (ref 65–99)
Glucose-Capillary: 112 mg/dL — ABNORMAL HIGH (ref 65–99)

## 2016-11-05 MED ORDER — HYDRALAZINE HCL 20 MG/ML IJ SOLN
10.0000 mg | INTRAMUSCULAR | Status: DC | PRN
Start: 2016-11-05 — End: 2016-11-29
  Administered 2016-11-05 – 2016-11-12 (×3): 10 mg via INTRAVENOUS
  Filled 2016-11-05 (×4): qty 1

## 2016-11-05 MED ORDER — HALOPERIDOL LACTATE 5 MG/ML IJ SOLN
1.0000 mg | INTRAMUSCULAR | Status: DC | PRN
  Administered 2016-11-05: 2 mg via INTRAVENOUS
  Administered 2016-11-06: 4 mg via INTRAVENOUS
  Administered 2016-11-06: 2.5 mg via INTRAVENOUS
  Filled 2016-11-05 (×3): qty 1

## 2016-11-05 MED ORDER — HYDROCODONE-ACETAMINOPHEN 7.5-325 MG/15ML PO SOLN
10.0000 mL | ORAL | Status: DC | PRN
  Administered 2016-11-05 – 2016-11-10 (×4): 10 mL
  Filled 2016-11-05 (×6): qty 15

## 2016-11-05 MED ORDER — ONDANSETRON HCL 4 MG/2ML IJ SOLN
4.0000 mg | Freq: Three times a day (TID) | INTRAMUSCULAR | Status: DC | PRN
  Administered 2016-11-05 – 2016-11-08 (×2): 4 mg via INTRAVENOUS
  Filled 2016-11-05 (×2): qty 2

## 2016-11-05 MED ORDER — METOPROLOL TARTRATE 25 MG/10 ML ORAL SUSPENSION
12.5000 mg | Freq: Two times a day (BID) | ORAL | Status: DC
  Administered 2016-11-05 – 2016-11-06 (×3): 12.5 mg
  Filled 2016-11-05 (×3): qty 5

## 2016-11-05 NOTE — Progress Notes (Signed)
eLink Physician-Brief Progress Note Patient Name: Scott Rivera DOB: 01/17/1951 MRN: [id number]   Date of Service  11/05/2016  HPI/Events of Note  L hand pain - abn hand xray ? Gout vs ra  eICU Interventions  For now rx pain with  hydocodone per ft     Intervention Category Minor Interventions: Routine modifications to care plan (e.g. PRN medications for pain, fever)  Christinia Gully 11/05/2016, 8:05 PM

## 2016-11-05 NOTE — Progress Notes (Addendum)
PULMONARY / CRITICAL CARE MEDICINE   Name: Scott Rivera MRN: [id number] DOB: [birth year]    ADMISSION DATE:10/19/2016 CONSULTATION DATE:10/29/16  CHIEF COMPLAINT:Respiratory distress  HISTORY OF PRESENT ILLNESS: 66 year old man admitted 7/1to outside hospital after being found down with multiple PCA infarcts in both cerebellum, right occipital lobe, right mid brain and left thalamus, transferred to [facility] on 7/5, rhabdomyolysis resolved, treated for aspiration with 7 days of antibiotics, had severe dysarthria &dysphagia requiring PEG 7/13 and had another aspiration episode 7/15 requiring mechanical ventilation. Unfortunately, failed extubation on 7/18 and required reintubation   Trach 7/20 >> Rt [place] 7/15 >>   SUBJECTIVE: Afebrile No obvious pain, on low dose precedex Tolerated ATC  VITAL SIGNS: BP (!) 152/75 (BP Location: Left Arm)   Pulse 96   Temp 98.5 F (36.9 C) (Oral)   Resp (!) 30   Ht 5\' 11"  (1.803 m)   Wt 169 lb 1.5 oz (76.7 kg)   SpO2 98%   BMI 23.58 kg/m   HEMODYNAMICS:    VENTILATOR SETTINGS: Vent Mode: Stand-by FiO2 (%):  [30 %-40 %] 30 % PEEP:  [5 cmH20] 5 cmH20 Pressure Support:  [5 cmH20-10 cmH20] 5 cmH20  INTAKE / OUTPUT: I/O last 3 completed shifts: In: 3974.4 [I.V.:914.4; Other:480; NG/GT:2380; IV Piggyback:200] Out: 5650 [Urine:5650]  PHYSICAL EXAMINATION: Gen: elderly, NAD , on ATC HEENT: PERRL, moist mucosa Lungs: clear BL  CV: RRR, no murmur Abd: + BS, soft, non tender Ext:no edema Skin: warm and dry, no rashes Neuro: spont opens eyes, does not follow commands  LABS:  BMET  Recent Labs Lab 11/03/16 0411 11/04/16 0251 11/05/16 0647  NA 142 142 141  K 3.0* 4.2 3.9  CL 111 112* 108  CO2 22 22 26   BUN 17 17 16   CREATININE 1.40* 1.31* 1.06  GLUCOSE 106* 116* 115*    Electrolytes  Recent Labs Lab 10/30/16 0419  11/01/16 0450  11/03/16 0411 11/04/16 0251 11/05/16 0647  CALCIUM 8.8*  < > 9.1  < > 8.8* 9.1  9.4  MG 1.9  --  1.9  --   --  1.9  --   PHOS 3.7  --  3.2  --   --  3.2  --   < > = values in this interval not displayed.  CBC  Recent Labs Lab 11/03/16 0411 11/04/16 0251 11/05/16 0402  WBC 19.3* 17.6* 18.1*  HGB 7.8* 8.2* 8.6*  HCT 24.5* 25.9* 28.1*  PLT 565* 593* 400    Coag's  Recent Labs Lab 11/03/16 0846  APTT 43*  INR 1.21    Sepsis Markers  Recent Labs Lab 10/29/16 1739 10/30/16 0030  10/31/16 0150 11/01/16 0450 11/02/16 0416  LATICACIDVEN 2.3* 1.1  --   --   --   --   PROCALCITON  --   --   < > 83.34 46.97 28.36  < > = values in this interval not displayed.  ABG No results for input(s): PHART, PCO2ART, PO2ART in the last 168 hours.  Liver Enzymes No results for input(s): AST, ALT, ALKPHOS, BILITOT, ALBUMIN in the last 168 hours.  Cardiac Enzymes No results for input(s): TROPONINI, PROBNP in the last 168 hours.  Glucose  Recent Labs Lab 11/04/16 1128 11/04/16 1557 11/04/16 2012 11/04/16 2356 11/05/16 0402 11/05/16 0759  GLUCAP 126* 115* 122* 98 110* 112*    Imaging Dg Chest Port 1 View  Result Date: 11/05/2016 CLINICAL DATA:  Acute respiratory failure.  Tracheostomy. EXAM: PORTABLE CHEST 1 VIEW COMPARISON:  11/03/2016  and chest CT 07/09/2014 FINDINGS: Tracheostomy tube in adequate position. Right subclavian central venous catheter unchanged with tip just below the cavoatrial junction. Lungs are adequately inflated demonstrate continued airspace opacification over the lung bases without significant change. No definite effusion. No pneumothorax. Cardiomediastinal silhouette is within normal. Sclerotic density over the left humeral head unchanged likely bone infarct. Remainder of the exam is unchanged. IMPRESSION: Bibasilar airspace opacification unchanged and may be due to infection or atelectasis. Tubes and lines as described. Electronically Signed   By: Marin Olp M.D.   On: 11/05/2016 07:25     DISCUSSION: Multiple ischemic CVA's  (PCA territory)  with aspiration PNA & prolonged ventilation requiring trach  ASSESSMENT / PLAN:  PULMONARY A: Acute resp failure s/p trach Reccurent aspiration P: Goal ATC  , expect cheyne stokes' breathing Speech eval  CARDIOVASCULAR A:  Septic shock, resolved Atrial septal aneurysm, TEE ruled out embolic source Htn P: prevastatin 20mg  Loop recorder as outpatient Resumed amlodipin Add metoprolol 12.5 bid   RENAL A:  AKI, improving P: Follow urine output and Cr  GASTROINTESTINAL A:  Protein calorie malnutrition  P: ct tube feedsvia PEG Continue pepcid Colace 100mg  BID   HEMATOLOGIC A:  Improvingleukocytosis 22.4>18 Anemia, stable at 8.3>7.8 P: Follow CBC   INFECTIOUS A:  Severe sepsis secondary to aspiration Procalcitonin trending downward 46.9>28 Leukocytosis improving P: Cont zosyn x 8ds, stop 7/22 Trend WBC  ENDOCRINE A:  No acute issues P: Monitor CBGs  NEUROLOGIC A:  Extensive b/l posterior cerebral infarcts, embolic pattern -b/l cerebellar, right pons, right midbrain, right PCA and left thalamus TEE neg P: Neuro off  -DAPT for 26mo then plavix alone On plavix     FAMILY - Updates: Updated sister daily - Inter-disciplinary family meet or Palliative Care meeting due by: [place] done 7/19  Can transfer to SDU & to triad once off precedex My cc time x 54m  Kara Mead MD. [facility]. [place] Pulmonary & Critical care Pager [phone] If no response call [phone]    11/05/2016, 9:28 AM

## 2016-11-05 NOTE — Progress Notes (Signed)
[place] Progress Note Patient Name: Scott Rivera DOB: [birth year] MRN: [id number]   Date of Service  11/05/2016  HPI/Events of Note  Episode of vomiting / on tube feeding with h/o asp  eICU Interventions  Hold tube feeding/ zofran IV      Intervention Category Minor Interventions: Routine modifications to care plan (e.g. PRN medications for pain, fever)  Christinia Gully 11/05/2016, 9:22 PM

## 2016-11-05 NOTE — Progress Notes (Signed)
Pt complaining of pain to his left arm. Heart rate sustaining in the 130s, respirations in the 40s, and patient very restless. MD made aware. Orders placed for hydrocodone. Will continue to monitor.

## 2016-11-05 NOTE — Progress Notes (Signed)
Pt with episode of vomiting 10 minutes after lopressor, hydrocodone, bethanechol, and pepcid given per tube. Tube feeds held. MD made aware. Zofran ordered, will continue to monitor.

## 2016-11-06 ENCOUNTER — Inpatient Hospital Stay (HOSPITAL_COMMUNITY): Payer: Medicare HMO

## 2016-11-06 LAB — GLUCOSE, CAPILLARY
Glucose-Capillary: 138 mg/dL — ABNORMAL HIGH (ref 65–99)
Glucose-Capillary: 72 mg/dL (ref 65–99)
Glucose-Capillary: 81 mg/dL (ref 65–99)
Glucose-Capillary: 88 mg/dL (ref 65–99)
Glucose-Capillary: 90 mg/dL (ref 65–99)
Glucose-Capillary: 94 mg/dL (ref 65–99)
Glucose-Capillary: 96 mg/dL (ref 65–99)
Glucose-Capillary: 99 mg/dL (ref 65–99)

## 2016-11-06 LAB — BASIC METABOLIC PANEL
Anion gap: 8 (ref 5–15)
BUN: 16 mg/dL (ref 6–20)
CO2: 26 mmol/L (ref 22–32)
Calcium: 9.4 mg/dL (ref 8.9–10.3)
Chloride: 103 mmol/L (ref 101–111)
Creatinine, Ser: 1.05 mg/dL (ref 0.61–1.24)
GFR calc Af Amer: >60 mL/min (ref >60)
GFR calc non Af Amer: >60 mL/min (ref >60)
Glucose, Bld: 95 mg/dL (ref 65–99)
Potassium: 5.4 mmol/L — ABNORMAL HIGH (ref 3.5–5.1)
Sodium: 137 mmol/L (ref 135–145)

## 2016-11-06 LAB — URINALYSIS, ROUTINE W REFLEX MICROSCOPIC
Bilirubin Urine: NEGATIVE
Glucose, UA: NEGATIVE mg/dL
Ketones, ur: NEGATIVE mg/dL
Nitrite: NEGATIVE
Protein, ur: 30 mg/dL — AB
Specific Gravity, Urine: 1.011 (ref 1.005–1.030)
pH: 5 (ref 5.0–8.0)

## 2016-11-06 LAB — CBC
HCT: 27.5 % — ABNORMAL LOW (ref 39.0–52.0)
Hemoglobin: 9.1 g/dL — ABNORMAL LOW (ref 13.0–17.0)
MCH: 28.6 pg (ref 26.0–34.0)
MCHC: 33.1 g/dL (ref 30.0–36.0)
MCV: 86.5 fL (ref 78.0–100.0)
Platelets: 687 10*3/uL — ABNORMAL HIGH (ref 150–400)
RBC: 3.18 MIL/uL — ABNORMAL LOW (ref 4.22–5.81)
RDW: 15.6 % — ABNORMAL HIGH (ref 11.5–15.5)
WBC: 30.4 10*3/uL — ABNORMAL HIGH (ref 4.0–10.5)

## 2016-11-06 MED ORDER — AMLODIPINE BESYLATE 10 MG PO TABS
10.0000 mg | ORAL_TABLET | Freq: Every day | ORAL | Status: DC
  Administered 2016-11-07 – 2016-11-29 (×23): 10 mg
  Filled 2016-11-06 (×23): qty 1

## 2016-11-06 MED ORDER — SODIUM CHLORIDE 0.9 % IV BOLUS (SEPSIS)
1000.0000 mL | Freq: Once | INTRAVENOUS | Status: AC
  Administered 2016-11-06: 1000 mL via INTRAVENOUS

## 2016-11-06 MED ORDER — METOPROLOL TARTRATE 25 MG PO TABS
25.0000 mg | ORAL_TABLET | Freq: Two times a day (BID) | ORAL | Status: DC
  Administered 2016-11-06 – 2016-11-07 (×2): 25 mg
  Filled 2016-11-06 (×2): qty 1

## 2016-11-06 MED ORDER — METOPROLOL TARTRATE 5 MG/5ML IV SOLN
2.5000 mg | INTRAVENOUS | Status: DC | PRN
  Administered 2016-11-06 (×2): 5 mg via INTRAVENOUS
  Filled 2016-11-06 (×2): qty 5

## 2016-11-06 MED ORDER — LACTULOSE 10 GM/15ML PO SOLN
10.0000 g | Freq: Three times a day (TID) | ORAL | Status: DC
  Administered 2016-11-06 – 2016-11-08 (×4): 10 g
  Filled 2016-11-06 (×7): qty 15

## 2016-11-06 MED ORDER — LORAZEPAM 2 MG/ML IJ SOLN
1.0000 mg | INTRAMUSCULAR | Status: DC | PRN
  Filled 2016-11-06: qty 1

## 2016-11-06 MED ORDER — FLEET ENEMA 7-19 GM/118ML RE ENEM
1.0000 | ENEMA | Freq: Every day | RECTAL | Status: DC | PRN
  Filled 2016-11-06: qty 1

## 2016-11-06 MED ORDER — VITAMIN B-1 100 MG PO TABS
100.0000 mg | ORAL_TABLET | Freq: Every day | ORAL | Status: DC
  Administered 2016-11-06 – 2016-11-29 (×24): 100 mg
  Filled 2016-11-06 (×24): qty 1

## 2016-11-06 MED ORDER — METOPROLOL TARTRATE 12.5 MG HALF TABLET: 12.5000 mg | ORAL_TABLET | Freq: Two times a day (BID) | ORAL | Status: DC

## 2016-11-06 MED ORDER — LACTULOSE 10 GM/15ML PO SOLN
30.0000 g | Freq: Once | ORAL | Status: AC
  Administered 2016-11-06: 30 g via ORAL
  Filled 2016-11-06: qty 45

## 2016-11-06 MED ORDER — METOPROLOL TARTRATE 5 MG/5ML IV SOLN
2.5000 mg | INTRAVENOUS | Status: DC | PRN
  Administered 2016-11-06 (×2): 2.5 mg via INTRAVENOUS
  Filled 2016-11-06 (×2): qty 5

## 2016-11-06 MED ORDER — ADULT MULTIVITAMIN W/MINERALS CH
1.0000 | ORAL_TABLET | Freq: Every day | ORAL | Status: DC
  Administered 2016-11-07 – 2016-11-29 (×23): 1
  Filled 2016-11-06 (×23): qty 1

## 2016-11-06 MED ORDER — METOCLOPRAMIDE HCL 5 MG/ML IJ SOLN
5.0000 mg | Freq: Four times a day (QID) | INTRAMUSCULAR | Status: DC
  Administered 2016-11-06 – 2016-11-08 (×7): 5 mg via INTRAVENOUS
  Filled 2016-11-06: qty 2
  Filled 2016-11-06 (×3): qty 1
  Filled 2016-11-06: qty 2
  Filled 2016-11-06 (×4): qty 1

## 2016-11-06 NOTE — Progress Notes (Addendum)
eLink Physician-Brief Progress Note Patient Name: Scott Rivera DOB: February 27, 1951 MRN: [id number]   Date of Service  11/06/2016  HPI/Events of Note  Sinus Tachycardia - HR = 124. Patient on Metoprolol PO, however, he vomited up his last dose. BP = 167/81.  eICU Interventions  Will order: 1. Metoprolol 2.5 mg IV Q 3 hours PRN HR > 115.      Intervention Category Major Interventions: Arrhythmia - evaluation and management  Reginaldo Hazard [place] 11/06/2016, 12:16 AM

## 2016-11-06 NOTE — Progress Notes (Signed)
[facility] TEAM 1 - Stepdown/ICU TEAM  Scott Rivera  OZY:[id number] DOB: June 28, 1950 DOA: 10/19/2016 PCP: Patient, No Pcp Per    Brief Narrative:  66 y.o. male with history of HTN who presented to [facility] on 10/15/2016 after family found him on the floor confused. MRI showed multiple acute infarcts involving both hemispheres of the cerebellum, the right occipital lobe. right midbrain, and left thalamus.  The fourth ventricle was patent/no hydrocephalus. TTE noted EF 55-60% w/ normal LV size. Carotid doppler showed right ICA 40-59% stenosis with left 60-79% stenosis. CXR noted infiltrates and patient was febrile therefore the pt was started on antibiotics. Creatinine at admission was 2.6, but this improved to 1.5.  CK of 7000 was noted at the time of admission. Patient's mentation slowly improved.  Family requested transfer to [facility], and this was accomplished on 7/5.  Significant Events: 7/1 admit to outside hospital 7/5 transfer to [facility] 7/13 PEG tube inserted  7/14 to ICU after vomit w/ aspiration - intubated  7/16 LE venous duplex - no DVT 0/48 TEE - no embolic source - negative bubble study - normal EF 7/18 extubated > recurrent aspiration > re-intubated  7/18 bronchoscopy for culture 7/20 tracheostomy  7/23 TRH resumed care   Subjective: The patient is staring off into space at the time of my evaluation.  He does not follow simple commands.  He does not communicate in any effective fashion.  He does not appear to be uncomfortable.  He does not appear to be in acute respiratory distress.    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 - Neuro suggests "dual antiplatelet therapy for 3 months followed by Plavix alone" - will need extensive PT/OT and SLP - to consider loop recorder at outpt - TEE w/o evidence of embolic source   FUO - Sinus tachycardia  Temp 101.2 early this morning - more than likely this is indicative of another episode of aspiration w/ pneumonitis and possible developing PNA - f/u CXR in AM - recheck blood and urine cx today - hold on abx for now and follow clinically   Atrial septal aneurysm TEE ruled out embolic source  Dysphagia Pt now has PEG tube in place - ongoing aspiration remains a problem - trial of reglan to determine if it will help decrease reflux   HLD Resumed treatment via feeding tube  HTN Blood pressure poorly controlled - adjust medical tx and follow   Rhabdomyolysis CK normalized  Acute renal failure Creatinine peaked at 2.6 - renal function has normalized  Recent Labs Lab 11/02/16 0416 11/03/16 0411 11/04/16 0251 11/05/16 0647 11/06/16 0345  CREATININE 1.46* 1.40* 1.31* 1.06 1.05    History of alcoholism  Mild transaminits  resolved - etiology unclear - viral hep panel negative - likely shock liver   Tobacco abuse  Severe malnutrition in context of acute illness  DVT prophylaxis: SCDs Code Status: FULL CODE Family Communication: spoke w/ sister at bedside  Disposition Plan: SDU  Consultants:  Neurology  Procedures: none  Antimicrobials:  None presently  Last dose of Zosyn 7/21  Objective: Blood pressure (!) 171/99,  pulse (!) 133, temperature 99.8 F (37.7 C), temperature source Oral, resp. rate (!) 29, height 5' 11" (1.803 m), weight 75.6 kg (166 lb 10.7 oz), SpO2 100 %.  Intake/Output Summary (Last 24 hours) at 11/06/16 0951 Last data filed at 11/06/16 0935  Gross per 24 hour  Intake             2029 ml  Output             2945 ml  Net             -916 ml   Filed Weights   11/04/16 0430  11/05/16 0500 11/06/16 0500  Weight: 76.2 kg (167 lb 15.9 oz) 76.7 kg (169 lb 1.5 oz) 75.6 kg (166 lb 10.7 oz)    Examination: General: respirations unlabored at present  Lungs: No focal crackles or wheezing Cardiovascular: RRR with no murmur rub or gallop Abdomen: Nondistended, soft, bowel sounds positive, no rebound - PEG insertion clean and dressed Extremities: No edema B lower extremities  CBC:  Recent Labs Lab 11/02/16 0416 11/03/16 0411 11/04/16 0251 11/05/16 0402 11/06/16 0345  WBC 22.4* 19.3* 17.6* 18.1* 30.4*  HGB 8.3* 7.8* 8.2* 8.6* 9.1*  HCT 25.3* 24.5* 25.9* 28.1* 27.5*  MCV 86.6 87.5 87.8 91.5 86.5  PLT 598* 565* 593* 400 671*   Basic Metabolic Panel:  Recent Labs Lab 11/01/16 0450 11/02/16 0416 11/03/16 0411 11/04/16 0251 11/05/16 0647 11/06/16 0345  NA 139 142 142 142 141 137  K 3.6 3.5 3.0* 4.2 3.9 5.4*  CL 111 112* 111 112* 108 103  CO2 _0 GLUCOSE 101* 95 106* 116* 115* 95  BUN 22* _1 CREATININE 1.39* 1.46* 1.40* 1.31* 1.06 1.05  CALCIUM 9.1 8.9 8.8* 9.1 9.4 9.4  MG 1.9  --   --  1.9  --   --   PHOS 3.2  --   --  3.2  --   --    GFR: Estimated Creatinine Clearance: 73.7 mL/min (by C-G formula based on SCr of 1.05 mg/dL).  Liver Function Tests: No results for input(s): AST, ALT, ALKPHOS, BILITOT, PROT, ALBUMIN in the last 168 hours.   Recent Results (from the past 240 hour(s))  Culture, respiratory (NON-Expectorated)     Status: None   Collection Time: 10/29/16 12:25 AM  Result Value Ref Range Status   Specimen Description ENDOTRACHEAL  Final   Special Requests NONE  Final   Gram Stain   Final    MODERATE WBC PRESENT, PREDOMINANTLY PMN RARE YEAST RARE GRAM POSITIVE COCCI IN PAIRS    Culture Consistent with normal respiratory flora.  Final   Report Status 10/31/2016 FINAL  Final  Culture, blood (routine x 2)     Status: None   Collection Time: 10/29/16  1:26 AM  Result Value Ref Range Status   Specimen  Description BLOOD LEFT FINGER  Final   Special Requests IN PEDIATRIC BOTTLE Blood Culture adequate volume  Final   Culture NO GROWTH 5 DAYS  Final   Report Status 11/03/2016 FINAL  Final  Culture, blood (routine x 2)     Status: None   Collection Time: 10/29/16  1:35 AM  Result Value Ref Range Status   Specimen Description BLOOD RIGHT HAND  Final   Special Requests IN PEDIATRIC BOTTLE Blood Culture adequate volume  Final   Culture NO GROWTH 5 DAYS  Final   Report Status 11/03/2016 FINAL  Final  Culture,  bal-quantitative     Status: Abnormal   Collection Time: 11/01/16  4:15 PM  Result Value Ref Range Status   Specimen Description BRONCHIAL ALVEOLAR LAVAGE  Final   Special Requests NONE  Final   Gram Stain   Final    ABUNDANT WBC PRESENT, PREDOMINANTLY PMN RARE GRAM POSITIVE RODS    Culture (A)  Final    60,000 COLONIES/mL Consistent with normal respiratory flora.   Report Status 11/04/2016 FINAL  Final     Scheduled Meds: . amLODipine  10 mg Per Tube Daily  . aspirin  325 mg Per Tube Daily  . bethanechol  10 mg Per Tube TID  . chlorhexidine gluconate (MEDLINE KIT)  15 mL Mouth Rinse BID  . Chlorhexidine Gluconate Cloth  6 each Topical Q0600  . famotidine  20 mg Per Tube BID  . feeding supplement (PRO-STAT SUGAR FREE 64)  30 mL Per Tube Daily  . free water  200 mL Per Tube QID  . heparin subcutaneous  5,000 Units Subcutaneous Q8H  . insulin aspart  0-9 Units Subcutaneous Q4H  . mouth rinse  15 mL Mouth Rinse QID  . metoprolol tartrate  12.5 mg Per Tube BID  . multivitamin  15 mL Per Tube Daily  . nicotine  14 mg Transdermal Daily  . pravastatin  20 mg Per Tube q1800  . thiamine injection  100 mg Intravenous Daily     LOS: 18 days   Cherene Altes, MD [facility] Office  [phone] Pager - Text Page per Amion as per below:  On-Call/Text Page:      [email or web address]      password [email or web address]  If 7PM-7AM, please contact night-coverage [email or web address] Password  [facility] 11/06/2016, 9:51 AM

## 2016-11-06 NOTE — Progress Notes (Signed)
eLink Physician-Brief Progress Note Patient Name: MAEJOR ERVEN DOB: Nov 02, 1950 MRN: [id number]   Date of Service  11/06/2016  HPI/Events of Note  EKG reveals sinus tachycardia. HR now = 132.  eICU Interventions  Will order: 1. -0.9 NaCl 1 liter IV over 1 hour now. 2. Increase Metoprolol to 2.5-5 mg IV Q 3 hours PRN HR > 115.     Intervention Category Major Interventions: Arrhythmia - evaluation and management  Aleynah Rocchio Eugene 11/06/2016, 5:08 AM

## 2016-11-06 NOTE — Progress Notes (Signed)
Trach consult performed. Pt currently tolerating 30% ATC well & has been on for past 2 days. All emergency equipment at bedside. No education at this time. Speech just came and did PMV trial. Will continue to follow.  Kathie Dike RRT

## 2016-11-06 NOTE — Evaluation (Signed)
Passy-Muir Speaking Valve - Evaluation Patient Details  Name: Scott Rivera MRN: [id number] Date of Birth: 01-May-1950  Today's Date: 11/06/2016 Time: 0950-1011 SLP Time Calculation (min) (ACUTE ONLY): 21 min  Past Medical History:  Past Medical History:  Diagnosis Date  . Hypertension   . Stroke [facility])    Past Surgical History:  Past Surgical History:  Procedure Laterality Date  . IR GASTROSTOMY TUBE MOD SED  10/27/2016  . KNEE SURGERY     HPI:  66 y.o. male with history of hypertension was brought to [facility] on 10/15/2016 after patient was found to be confused. As per the family who provided most of the history patient was not able to be raised and family went to check on him at home and was found on the floor confused. Patient was taken to the ER. The last contact was 2 days ago by patient's brother prior to the episode. At [facility] MRI showed multiple acute infarcts involving the both hemisphere of the cerebellum and right occipital lobe right midbrain and left thalamus the fourth ventricle was patent no hydrocephalus. 2-D echo showing EF of 55-60% normal LV size. Carotid Doppler was done which showed right ICA 40-59% stenosis with left 60-79% stenosis. Chest x-ray was showing infiltrates and patient also was febrile for which patient was started on antibiotics. Patient's creatinine at admission was 2.6 which improved to 1.5 the time of discharge. Patient also had rhabdomyolysis with CK of 7000 at the time of admission. As per the note patient was initially agitated requiring Haldol at the other hospital. Patient's mentation slowly improved gradually and family requested transfer to [facility] after discussing with neurologist at [facility] Dr. Leonel Rivera   Assessment / Plan / Recommendation Clinical Impression  Pt demonstrates excellent tolerance of cuff deflation with immediate sensation and forceful reflexive cough to  mobilize secretions. No change in vital signs noted with deflation or after 15 minutes of placement, no signs of air trapping. Pt remained lethargic and poorly participatory however despite max stimulation and use of sister Scott Rivera as therapeutic agent. Scott Rivera mentions pt goes by [name] Pt did demonstrate ability to phonate x2 with an automiatc verbal response that may have been "OK" or "huh." Recommend pt use PMSV when alert with full staff supervision and that cuff remain deflated. Will follow for readiness to advance use, facilitate functional communication and resume swallow therapy when appropriate.  SLP Visit Diagnosis: Aphonia (R49.1)    SLP Assessment  Patient needs continued Speech Lanaguage Pathology Services    Follow Up Recommendations  Skilled Nursing facility    Frequency and Duration min 2x/week  2 weeks    PMSV Trial PMSV was placed for: 15 mintues Able to redirect subglottic air through upper airway: Yes Able to Attain Phonation: Yes Voice Quality: Low vocal intensity;Breathy Able to Expectorate Secretions: No Level of Secretion Expectoration with PMSV: Not observed Intelligibility: Intelligibility reduced Word: 0-24% accurate Phrase: Not tested Sentence: Not tested Conversation: Not tested Respirations During Trial: 28 SpO2 During Trial: 99 % Behavior: Lethargic   Tracheostomy Tube  Additional Tracheostomy Tube Assessment Trach Collar Period: 24 hrs day Secretion Description: white, thick but easily mobilized Level of Secretion Expectoration: Tracheal    Vent Dependency  Vent Dependent: No FiO2 (%): 30 %    Cuff Deflation Trial  GO Tolerated Cuff Deflation: Yes Length of Time for Cuff Deflation Trial: 20 minutes Behavior: Poor eye contact  Scott Rivera, Scott Rivera 11/06/2016, 10:27 AM

## 2016-11-06 NOTE — Progress Notes (Signed)
Continued tachycardia despite 2 doses of lopressor given. Tylenol and ice for temperature of 101.2 given with a repeat temperature of 99.6. Patient restless. MD made aware. Orders for 1,046ml bolus given and lopressor dose range changed. Will continue to monitor.

## 2016-11-07 ENCOUNTER — Inpatient Hospital Stay (HOSPITAL_COMMUNITY): Payer: Medicare HMO

## 2016-11-07 DIAGNOSIS — J690 Pneumonitis due to inhalation of food and vomit: Secondary | ICD-10-CM

## 2016-11-07 DIAGNOSIS — R000 Tachycardia, unspecified: Secondary | ICD-10-CM

## 2016-11-07 DIAGNOSIS — A419 Sepsis, unspecified organism: Secondary | ICD-10-CM

## 2016-11-07 DIAGNOSIS — M6282 Rhabdomyolysis: Secondary | ICD-10-CM

## 2016-11-07 DIAGNOSIS — I63532 Cerebral infarction due to unspecified occlusion or stenosis of left posterior cerebral artery: Secondary | ICD-10-CM

## 2016-11-07 DIAGNOSIS — E43 Unspecified severe protein-calorie malnutrition: Secondary | ICD-10-CM

## 2016-11-07 DIAGNOSIS — I63531 Cerebral infarction due to unspecified occlusion or stenosis of right posterior cerebral artery: Secondary | ICD-10-CM

## 2016-11-07 DIAGNOSIS — R509 Fever, unspecified: Secondary | ICD-10-CM

## 2016-11-07 DIAGNOSIS — R6520 Severe sepsis without septic shock: Secondary | ICD-10-CM

## 2016-11-07 LAB — CBC
HCT: 26.6 % — ABNORMAL LOW (ref 39.0–52.0)
Hemoglobin: 8.5 g/dL — ABNORMAL LOW (ref 13.0–17.0)
MCH: 28.1 pg (ref 26.0–34.0)
MCHC: 32 g/dL (ref 30.0–36.0)
MCV: 87.8 fL (ref 78.0–100.0)
Platelets: 600 10*3/uL — ABNORMAL HIGH (ref 150–400)
RBC: 3.03 MIL/uL — ABNORMAL LOW (ref 4.22–5.81)
RDW: 16 % — ABNORMAL HIGH (ref 11.5–15.5)
WBC: 27.7 10*3/uL — ABNORMAL HIGH (ref 4.0–10.5)

## 2016-11-07 LAB — POCT I-STAT 3, ART BLOOD GAS (G3+)
Acid-Base Excess: 3 mmol/L — ABNORMAL HIGH (ref 0.0–2.0)
Bicarbonate: 26.4 mmol/L (ref 20.0–28.0)
O2 Saturation: 94 %
Patient temperature: 99.1
TCO2: 27 mmol/L (ref 0–100)
pCO2 arterial: 34.1 mmHg (ref 32.0–48.0)
pH, Arterial: 7.499 — ABNORMAL HIGH (ref 7.350–7.450)
pO2, Arterial: 64 mmHg — ABNORMAL LOW (ref 83.0–108.0)

## 2016-11-07 LAB — GLUCOSE, CAPILLARY
Glucose-Capillary: 106 mg/dL — ABNORMAL HIGH (ref 65–99)
Glucose-Capillary: 108 mg/dL — ABNORMAL HIGH (ref 65–99)
Glucose-Capillary: 114 mg/dL — ABNORMAL HIGH (ref 65–99)
Glucose-Capillary: 118 mg/dL — ABNORMAL HIGH (ref 65–99)
Glucose-Capillary: 120 mg/dL — ABNORMAL HIGH (ref 65–99)
Glucose-Capillary: 95 mg/dL (ref 65–99)

## 2016-11-07 LAB — BASIC METABOLIC PANEL
Anion gap: 8 (ref 5–15)
BUN: 13 mg/dL (ref 6–20)
CO2: 24 mmol/L (ref 22–32)
Calcium: 9.4 mg/dL (ref 8.9–10.3)
Chloride: 107 mmol/L (ref 101–111)
Creatinine, Ser: 0.97 mg/dL (ref 0.61–1.24)
GFR calc Af Amer: >60 mL/min (ref >60)
GFR calc non Af Amer: >60 mL/min (ref >60)
Glucose, Bld: 105 mg/dL — ABNORMAL HIGH (ref 65–99)
Potassium: 3.8 mmol/L (ref 3.5–5.1)
Sodium: 139 mmol/L (ref 135–145)

## 2016-11-07 LAB — URINE CULTURE: Culture: >=100000 — AB

## 2016-11-07 LAB — MAGNESIUM: Magnesium: 1.7 mg/dL (ref 1.7–2.4)

## 2016-11-07 MED ORDER — METOPROLOL TARTRATE 50 MG PO TABS
50.0000 mg | ORAL_TABLET | Freq: Two times a day (BID) | ORAL | Status: DC
  Administered 2016-11-07 – 2016-11-08 (×2): 50 mg
  Filled 2016-11-07 (×3): qty 1

## 2016-11-07 NOTE — Progress Notes (Addendum)
No charge note.  Palliative consult received-   Patient seen and examined- no family at bedside.   Left message for patient's sister- [name] requesting return call.  Mariana Kaufman, AGNP-C Palliative Medicine  Please call Palliative Medicine team phone with any questions [phone]. For individual providers please see AMION.

## 2016-11-07 NOTE — Progress Notes (Signed)
  Speech Language Pathology Treatment: [name] Speaking valve  Patient Details Name: Scott Rivera MRN: [id number] DOB: 08/02/1950 Today's Date: 11/07/2016 Time: 1010-1030 SLP Time Calculation (min) (ACUTE ONLY): 20 min  Assessment / Plan / Recommendation Clinical Impression  Pt demonstrates adequate tolerance of PMSV though lethargy  And cognitive impairment are still a barrier to phonation despite max cueing from SLP and sister. Will continue efforts.   HPI HPI: 66 y.o. male with history of hypertension was brought to [facility] on 10/15/2016 after patient was found to be confused. As per the family who provided most of the history patient was not able to be raised and family went to check on him at home and was found on the floor confused. Patient was taken to the ER. The last contact was 2 days ago by patient's brother prior to the episode. At [facility] MRI showed multiple acute infarcts involving the both hemisphere of the cerebellum and right occipital lobe right midbrain and left thalamus the fourth ventricle was patent no hydrocephalus. 2-D echo showing EF of 55-60% normal LV size. Carotid Doppler was done which showed right ICA 40-59% stenosis with left 60-79% stenosis. Chest x-ray was showing infiltrates and patient also was febrile for which patient was started on antibiotics. Patient's creatinine at admission was 2.6 which improved to 1.5 the time of discharge. Patient also had rhabdomyolysis with CK of 7000 at the time of admission. As per the note patient was initially agitated requiring Haldol at the other hospital. Patient's mentation slowly improved gradually and family requested transfer to [facility] after discussing with neurologist at [facility] Dr. Leonel Ramsay      SLP Plan  Continue with current plan of care       Recommendations         Patient may use Passy-Muir Speech Valve: During all therapies with  supervision;Intermittently with supervision PMSV Supervision: Full MD: Please consider changing trach tube to : Cuffless         Oral Care Recommendations: Oral care QID Follow up Recommendations: Skilled Nursing facility Plan: Continue with current plan of care       GO                Scott Rivera, Katherene Ponto 11/07/2016, 1:33 PM

## 2016-11-07 NOTE — Progress Notes (Signed)
PROGRESS NOTE    KRISTIN LAMAGNA  GMW:[id number] DOB: 20-Aug-1950 DOA: 10/19/2016 PCP: Patient, No Pcp Per   Brief Narrative:  66 y.o.BM PMHx HTN, CVA,  Presented to [facility] on 10/15/2016 after family found him on the floor confused. MRI showed multiple acute infarcts involving both hemispheres of the cerebellum, the right occipital lobe. right midbrain, and left thalamus.  The fourth ventricle was patent/no hydrocephalus. TTE noted EF 55-60% w/ normal LV size. Carotid doppler showed right ICA 40-59% stenosis with left 60-79% stenosis. CXR noted infiltrates and patient was febrile therefore the pt was started on antibiotics. Creatinine at admission was 2.6, but this improved to 1.5.  CK of 7000 was noted at the time of admission. Patient's mentation slowly improved.  Family requested transfer to [facility], and this was accomplished on 7/5.   Subjective: 7/24 MAXIMUM TEMPERATURE last 24 hours 38.1C, A/O 0, opens eyes with painful stimulation, does not follow commands, will spontaneously move right arm   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[place]) Active Problems:   ARF (acute renal failure) (HCC)   Essential hypertension   Rhabdomyolysis   Acute ischemic stroke (HCC)   Pressure injury of skin   Dysphagia, post-stroke   Benign essential HTN   Tobacco abuse   Tachypnea   Hyperglycemia   Hypernatremia   Leukocytosis   Acute blood loss anemia   Chronic kidney disease   Septic shock (HCC)   Acute respiratory failure ([place])   Aspiration into airway   Severe sepsis / Left lung Aspiration PNA with possible mucous plugging/Recurrent aspiration  -S/p trach 7/20 - appears to have persisting problem w/ aspiration - cont all possible interventions to prevent as able  -Patient with possible developing pneumonitis vs new aspiration pneumonia (per staff aspirated again 48 hour's ago).  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 Currently patient unable to participate even for evaluation. -Per speech able to tolerate PMV  - to consider loop recorder at outpt  - TEE w/o evidence of embolic source   FUO - - MAXIMUM TEMPERATURE last 24 hours 38.1C  - more than likely this is indicative of another episode of aspiration w/ pneumonitis and possible developing PNA - f/u CXR in AM - recheck blood and urine cx today - hold on abx for now and follow clinically  -PCXR pending  Atrial septal aneurysm -TEE ruled out embolic source  Sinus tachycardia -Amlodipine 10 mg daily -Hydralazine PRN -7/24 increase metoprolol 50 mg BID  HTN -See sinus tachycardia    Dysphagia -Pt now has PEG tube in place - ongoing aspiration remains a problem - trial of reglan to determine if it will help decrease reflux   HLD -Pravachol 20 mg daily    Rhabdomyolysis -Resolved  Acute renal failure Creatinine peaked at 2.6 - renal function has normalized Lab Results  Component Value Date   CREATININE 1.05 11/06/2016   CREATININE 1.06 11/05/2016   CREATININE 1.31 (H) 11/04/2016    History of alcoholism  Mild transaminits  -resolved - etiology unclear - viral hep panel negative - likely shock liver   Tobacco abuse  Severe malnutrition in context of acute illness -PEG tube placed  DVT prophylaxis: SCD Code Status: Full Family Communication: Spoke at great length with sister explained poor prognosis Disposition Plan: SNF when stable   Consultants:  Neurology PC CM   Procedures/Significant Events:  7/1 admit to outside hospital 7/5 transfer to [facility] 7/13 PEG tube inserted  7/14 to ICU after vomit w/ aspiration - intubated  7/16 LE venous duplex -  no DVT 0/10 TEE - no embolic source - negative bubble study - normal EF 7/18 extubated > recurrent aspiration > re-intubated  7/18 bronchoscopy for culture 7/20 tracheostomy  7/23 TRH resumed care    VENTILATOR SETTINGS: None   Cultures 7/6 MRSA by PCR negative 7/15 endotracheal normal flora 7/15 blood negative final 7/18 BAL normal flora 7/23 urine positive yeast 7/23 blood NGTD     Antimicrobials: Anti-infectives    Start     Stop   10/29/16 1200  vancomycin (VANCOCIN) IVPB 1000 mg/200 mL premix  Status:  Discontinued     10/31/16 1140   10/29/16 0600  piperacillin-tazobactam (ZOSYN) IVPB 3.375 g     11/05/16 0138   10/29/16 0100  vancomycin (VANCOCIN) 1,500 mg in sodium chloride 0.9 % 500 mL IVPB     10/29/16 0539   10/29/16 0100  piperacillin-tazobactam (ZOSYN) IVPB 3.375 g     10/29/16 0410   10/29/16 0045  piperacillin-tazobactam (ZOSYN) IVPB 3.375 g  Status:  Discontinued     10/29/16 0036   10/29/16 0045  vancomycin (VANCOCIN) IVPB 1000 mg/200 mL premix  Status:  Discontinued     10/29/16 0037   10/26/16 1000  amoxicillin-clavulanate (AUGMENTIN) 600-42.9 MG/5ML suspension 875 mg  Status:  Discontinued     10/29/16 0034   10/25/16 1000  ceFEPIme (MAXIPIME) 1 g in dextrose 5 % 50 mL IVPB     10/25/16 2345   10/20/16 2200  ceFEPIme (MAXIPIME) 1 g in dextrose 5 % 50 mL IVPB  Status:  Discontinued     10/25/16 0922   10/19/16 2300  ceFEPIme (MAXIPIME) 1 g in dextrose 5 % 50 mL IVPB  Status:  Discontinued     10/20/16 1047   10/19/16 2200  vancomycin (VANCOCIN) IVPB 750 mg/150 ml premix  Status:  Discontinued     10/20/16 1131   10/19/16 2115  vancomycin (VANCOCIN) 1,500 mg in sodium chloride 0.9 % 500 mL IVPB  Status:  Discontinued     10/19/16 2117   10/19/16 2115  ceFEPIme (MAXIPIME) 2 g in dextrose 5 % 50 mL IVPB  Status:  Discontinued     10/19/16 2120       Devices None   LINES / TUBES:  None    Continuous Infusions: . feeding supplement (VITAL  AF 1.2 CAL) 1,000 mL (11/07/16 0509)     Objective: Vitals:   11/07/16 0319 11/07/16 0400 11/07/16 0500 11/07/16 0600  BP: (!) 143/77 (!) 160/80 (!) 148/66 (!) 155/74  Pulse: (!) 126 (!) 129 (!) 122 (!) 132  Resp: 20 (!) 26 (!) 23 (!) 23  Temp:      TempSrc:      SpO2: 95% 97% 90% 96%  Weight:      Height:        Intake/Output Summary (Last 24 hours) at 11/07/16 0735 Last data filed at 11/07/16 0600  Gross per 24 hour  Intake             1675 ml  Output             1830 ml  Net             -  155 ml   Filed Weights   11/05/16 0500 11/06/16 0500 11/07/16 0317  Weight: 169 lb 1.5 oz (76.7 kg) 166 lb 10.7 oz (75.6 kg) 164 lb 14.5 oz (74.8 kg)    Examination:  General: A/O 0, opens eyes to painful stimuli, does not respond to commands, positive acute respiratory distress Eyes: negative scleral hemorrhage, negative anisocoria, negative icterus ENT: Negative Runny nose, negative gingival bleeding,, poor dentation Neck:  Negative scars, masses, torticollis, lymphadenopathy, JVD #6 trach in place, negative sign of infection. Purulent sputum from tracheostomy site. Lungs: tachypnea, decreased air movement by basilar, negative wheezes, negative crackles  Cardiovascular: Tachycardic, Regular rhythm without murmur gallop or rub normal S1 and S2 Abdomen: negative abdominal pain, nondistended, positive soft, bowel sounds, no rebound, no ascites, no appreciable mass, PEG tube in place negative sign of infection Extremities: No significant cyanosis, clubbing, or edema bilateral lower extremities Skin: Negative rashes, lesions, ulcers Psychiatric:  Unable to assess secondary to patient's altered mental status  Central nervous system:  Opens eyes to painful stimuli, mild withdraw from painful stimuli of extremities, does not follow commands. Unable to further assess secondary renal status  .     Data Reviewed: Care during the described time interval was provided by me .  I have reviewed  this patient's available data, including medical history, events of note, physical examination, and all test results as part of my evaluation. I have personally reviewed and interpreted all radiology studies.  CBC:  Recent Labs Lab 11/02/16 0416 11/03/16 0411 11/04/16 0251 11/05/16 0402 11/06/16 0345  WBC 22.4* 19.3* 17.6* 18.1* 30.4*  HGB 8.3* 7.8* 8.2* 8.6* 9.1*  HCT 25.3* 24.5* 25.9* 28.1* 27.5*  MCV 86.6 87.5 87.8 91.5 86.5  PLT 598* 565* 593* 400 258*   Basic Metabolic Panel:  Recent Labs Lab 11/01/16 0450 11/02/16 0416 11/03/16 0411 11/04/16 0251 11/05/16 0647 11/06/16 0345  NA 139 142 142 142 141 137  K 3.6 3.5 3.0* 4.2 3.9 5.4*  CL 111 112* 111 112* 108 103  CO2 _0 GLUCOSE 101* 95 106* 116* 115* 95  BUN 22* _1 CREATININE 1.39* 1.46* 1.40* 1.31* 1.06 1.05  CALCIUM 9.1 8.9 8.8* 9.1 9.4 9.4  MG 1.9  --   --  1.9  --   --   PHOS 3.2  --   --  3.2  --   --    GFR: Estimated Creatinine Clearance: 73.2 mL/min (by C-G formula based on SCr of 1.05 mg/dL). Liver Function Tests: No results for input(s): AST, ALT, ALKPHOS, BILITOT, PROT, ALBUMIN in the last 168 hours. No results for input(s): LIPASE, AMYLASE in the last 168 hours. No results for input(s): AMMONIA in the last 168 hours. Coagulation Profile:  Recent Labs Lab 11/03/16 0846  INR 1.21   Cardiac Enzymes: No results for input(s): CKTOTAL, CKMB, CKMBINDEX, TROPONINI in the last 168 hours. BNP (last 3 results) No results for input(s): PROBNP in the last 8760 hours. HbA1C: No results for input(s): HGBA1C in the last 72 hours. CBG:  Recent Labs Lab 11/06/16 1105 11/06/16 1519 11/06/16 1952 11/06/16 2320 11/07/16 0312  GLUCAP 94 88 72 96 95   Lipid Profile: No results for input(s): CHOL, HDL, LDLCALC, TRIG, CHOLHDL, LDLDIRECT in the last 72 hours. Thyroid Function Tests: No results for input(s): TSH, T4TOTAL, FREET4, T3FREE, THYROIDAB in the last 72 hours. Anemia  Panel: No results for input(s): VITAMINB12, FOLATE, FERRITIN, TIBC, IRON, RETICCTPCT in  the last 72 hours. Urine analysis:    Component Value Date/Time   COLORURINE YELLOW 11/06/2016 1532   APPEARANCEUR HAZY (A) 11/06/2016 1532   LABSPEC 1.011 11/06/2016 1532   PHURINE 5.0 11/06/2016 1532   GLUCOSEU NEGATIVE 11/06/2016 1532   HGBUR MODERATE (A) 11/06/2016 1532   BILIRUBINUR NEGATIVE 11/06/2016 1532   KETONESUR NEGATIVE 11/06/2016 1532   PROTEINUR 30 (A) 11/06/2016 1532   NITRITE NEGATIVE 11/06/2016 1532   LEUKOCYTESUR LARGE (A) 11/06/2016 1532   Sepsis Labs: _0 (procalcitonin:4,lacticidven:4)  ) Recent Results (from the past 240 hour(s))  Culture, respiratory (NON-Expectorated)     Status: None   Collection Time: 10/29/16 12:25 AM  Result Value Ref Range Status   Specimen Description ENDOTRACHEAL  Final   Special Requests NONE  Final   Gram Stain   Final    MODERATE WBC PRESENT, PREDOMINANTLY PMN RARE YEAST RARE GRAM POSITIVE COCCI IN PAIRS    Culture Consistent with normal respiratory flora.  Final   Report Status 10/31/2016 FINAL  Final  Culture, blood (routine x 2)     Status: None   Collection Time: 10/29/16  1:26 AM  Result Value Ref Range Status   Specimen Description BLOOD LEFT FINGER  Final   Special Requests IN PEDIATRIC BOTTLE Blood Culture adequate volume  Final   Culture NO GROWTH 5 DAYS  Final   Report Status 11/03/2016 FINAL  Final  Culture, blood (routine x 2)     Status: None   Collection Time: 10/29/16  1:35 AM  Result Value Ref Range Status   Specimen Description BLOOD RIGHT HAND  Final   Special Requests IN PEDIATRIC BOTTLE Blood Culture adequate volume  Final   Culture NO GROWTH 5 DAYS  Final   Report Status 11/03/2016 FINAL  Final  Culture, bal-quantitative     Status: Abnormal   Collection Time: 11/01/16  4:15 PM  Result Value Ref Range Status   Specimen Description BRONCHIAL ALVEOLAR LAVAGE  Final   Special Requests NONE  Final   Gram  Stain   Final    ABUNDANT WBC PRESENT, PREDOMINANTLY PMN RARE GRAM POSITIVE RODS    Culture (A)  Final    60,000 COLONIES/mL Consistent with normal respiratory flora.   Report Status 11/04/2016 FINAL  Final         Radiology Studies: Dg Abd 1 View  Result Date: 11/06/2016 CLINICAL DATA:  Constipation. EXAM: ABDOMEN - 1 VIEW COMPARISON:  Radiograph of October 29, 2016. FINDINGS: No abnormal bowel dilatation is noted. Gastrostomy tube is seen projected over gastric air bubble. Midline surgical staples are noted. Moderate amount of stool is seen throughout the colon. IMPRESSION: Moderate stool burden is noted. Gastrostomy tube is in grossly good position. No abnormal bowel dilatation is noted. Electronically Signed   By: Marijo Conception, M.D.   On: 11/06/2016 09:00   Dg Hand Complete Left  Result Date: 11/05/2016 CLINICAL DATA:  Swelling of the hand.  Diabetes. EXAM: LEFT HAND - COMPLETE 3+ VIEW COMPARISON:  Report from 01/03/1994 FINDINGS: The hand is clenched into a fist during imaging, and assessment of the fingers is markedly limited because of this. The wrist is also extended during imaging. Nonstandard positioning reduces diagnostic sensitivity and specificity. Hypodensities are present in the carpus, favoring erosions along articular surfaces. Reduced radioscaphoid distance. Small erosion along the ulnar styloid. Questionable faint calcification in the TFCC disc. Soft tissue swelling along the volar wrist on the somewhat oblique to lateral projection attempt. The formal oblique demonstrates distorted  magnification but confirms findings on the other projections. No significant spurring of the metacarpal heads. IMPRESSION: 1. Erosive arthropathy in the carpus, with questionable TFCC disc chondrocalcinosis. Interphalangeal joints cannot be readily assessed due to the patient's hand being imaged in a hinged fist position. Possibilities include rheumatoid arthritis and gout. CPPD arthropathy is  considered less likely despite the questionable chondrocalcinosis. I am skeptical of infection given the involvement of multiple compartments including the midcarpal row and proximal carpal row, but if there is a high clinical suspicion of infection based on other indicators such as leukocytosis, fever, or fluctuance, then cross-sectional imaging may be warranted. Electronically Signed   By: Van Clines M.D.   On: 11/05/2016 13:23        Scheduled Meds: . amLODipine  10 mg Per Tube Daily  . aspirin  325 mg Per Tube Daily  . chlorhexidine gluconate (MEDLINE KIT)  15 mL Mouth Rinse BID  . famotidine  20 mg Per Tube BID  . feeding supplement (PRO-STAT SUGAR FREE 64)  30 mL Per Tube Daily  . free water  200 mL Per Tube QID  . heparin subcutaneous  5,000 Units Subcutaneous Q8H  . insulin aspart  0-9 Units Subcutaneous Q4H  . lactulose  10 g Per Tube TID  . mouth rinse  15 mL Mouth Rinse QID  . metoCLOPramide (REGLAN) injection  5 mg Intravenous Q6H  . metoprolol tartrate  25 mg Per Tube BID  . multivitamin with minerals  1 tablet Per Tube Daily  . nicotine  14 mg Transdermal Daily  . pravastatin  20 mg Per Tube q1800  . thiamine  100 mg Per Tube Daily   Continuous Infusions: . feeding supplement (VITAL AF 1.2 CAL) 1,000 mL (11/07/16 0509)     LOS: 19 days    Time spent: 40 minutes    Jeffrie Lofstrom, Geraldo Docker, MD [facility] Pager [phone]   If 7PM-7AM, please contact night-coverage [email or web address] Password [facility] 11/07/2016, 7:35 AM

## 2016-11-07 NOTE — Progress Notes (Signed)
Sister called and updated on room transfer to 3M02. Bartholomew Crews, RN 11/07/2016 7:20 PM

## 2016-11-08 ENCOUNTER — Inpatient Hospital Stay (HOSPITAL_COMMUNITY): Payer: Medicare HMO

## 2016-11-08 LAB — BASIC METABOLIC PANEL
Anion gap: 9 (ref 5–15)
BUN: 17 mg/dL (ref 6–20)
CO2: 25 mmol/L (ref 22–32)
Calcium: 9.8 mg/dL (ref 8.9–10.3)
Chloride: 105 mmol/L (ref 101–111)
Creatinine, Ser: 0.97 mg/dL (ref 0.61–1.24)
GFR calc Af Amer: >60 mL/min (ref >60)
GFR calc non Af Amer: >60 mL/min (ref >60)
Glucose, Bld: 89 mg/dL (ref 65–99)
Potassium: 4 mmol/L (ref 3.5–5.1)
Sodium: 139 mmol/L (ref 135–145)

## 2016-11-08 LAB — CBC WITH DIFFERENTIAL/PLATELET
Basophils Absolute: 0 10*3/uL (ref 0.0–0.1)
Basophils Relative: 0 %
Eosinophils Absolute: 0.3 10*3/uL (ref 0.0–0.7)
Eosinophils Relative: 1 %
HCT: 23.2 % — ABNORMAL LOW (ref 39.0–52.0)
Hemoglobin: 7.5 g/dL — ABNORMAL LOW (ref 13.0–17.0)
Lymphocytes Relative: 10 %
Lymphs Abs: 2.8 10*3/uL (ref 0.7–4.0)
MCH: 28.1 pg (ref 26.0–34.0)
MCHC: 32.3 g/dL (ref 30.0–36.0)
MCV: 86.9 fL (ref 78.0–100.0)
Monocytes Absolute: 3.3 10*3/uL — ABNORMAL HIGH (ref 0.1–1.0)
Monocytes Relative: 12 %
Neutro Abs: 21.2 10*3/uL — ABNORMAL HIGH (ref 1.7–7.7)
Neutrophils Relative %: 77 %
Platelets: 710 10*3/uL — ABNORMAL HIGH (ref 150–400)
RBC: 2.67 MIL/uL — ABNORMAL LOW (ref 4.22–5.81)
RDW: 15.7 % — ABNORMAL HIGH (ref 11.5–15.5)
WBC: 27.6 10*3/uL — ABNORMAL HIGH (ref 4.0–10.5)

## 2016-11-08 LAB — GLUCOSE, CAPILLARY
Glucose-Capillary: 97 mg/dL (ref 65–99)
Glucose-Capillary: 86 mg/dL (ref 65–99)
Glucose-Capillary: 89 mg/dL (ref 65–99)
Glucose-Capillary: 82 mg/dL (ref 65–99)
Glucose-Capillary: 84 mg/dL (ref 65–99)

## 2016-11-08 LAB — MAGNESIUM: Magnesium: 1.8 mg/dL (ref 1.7–2.4)

## 2016-11-08 MED ORDER — FREE WATER
100.0000 mL | Freq: Four times a day (QID) | Status: DC
  Administered 2016-11-08 – 2016-11-13 (×22): 100 mL

## 2016-11-08 MED ORDER — METOPROLOL TARTRATE 100 MG PO TABS
100.0000 mg | ORAL_TABLET | Freq: Two times a day (BID) | ORAL | Status: DC
  Administered 2016-11-08 – 2016-11-25 (×34): 100 mg
  Filled 2016-11-08 (×35): qty 1

## 2016-11-08 MED ORDER — MORPHINE SULFATE (PF) 4 MG/ML IV SOLN: 2.0000 mg | INTRAVENOUS | Status: DC | PRN

## 2016-11-08 MED ORDER — METOCLOPRAMIDE HCL 5 MG/ML IJ SOLN
10.0000 mg | Freq: Four times a day (QID) | INTRAMUSCULAR | Status: DC
  Administered 2016-11-08 – 2016-11-13 (×21): 10 mg via INTRAVENOUS
  Filled 2016-11-08 (×22): qty 2

## 2016-11-08 MED ORDER — CLONIDINE HCL 0.1 MG PO TABS
0.1000 mg | ORAL_TABLET | Freq: Two times a day (BID) | ORAL | Status: DC
  Administered 2016-11-08 – 2016-11-29 (×43): 0.1 mg
  Filled 2016-11-08 (×43): qty 1

## 2016-11-08 MED ORDER — LACTULOSE 10 GM/15ML PO SOLN
10.0000 g | Freq: Two times a day (BID) | ORAL | Status: DC
  Administered 2016-11-08 – 2016-11-12 (×8): 10 g
  Filled 2016-11-08 (×9): qty 15

## 2016-11-08 NOTE — Progress Notes (Signed)
  Speech Language Pathology Treatment: [name] Speaking valve  Patient Details Name: Scott Rivera MRN: [id number] DOB: May 06, 1950 Today's Date: 11/08/2016 Time: 5456-2563 SLP Time Calculation (min) (ACUTE ONLY): 14 min  Assessment / Plan / Recommendation Clinical Impression  Pt more alert this am, per RN has been alert since transfer from [facility] yesterday but needed to go back on the ventilaotr last night, suspected to have vomited again. . Pt tachycardic at baseline. O2 saturations lower today at baseline as well. When cuff deflated pt still able to expectorate secretions over 1-2 minutes, but O2 sats dropped into the 80s with efforts, then recovered with rest. PMSV placed with good tolerance, no change in vitals, though again mildly unstable at baseline today. Pt able to phonate several times during interactions with his sister, attempting to respond socially to conversation, even smiling and laughing. Session ended due to arrival of rad tech for imaging, cuff reinflated due to risk of vomiting. Discussed that RN may place PMSV with extra care to always check and deflate cuff, reinflate after placement. Will place sign to alert RN to precautions.    HPI HPI: 66 y.o. male with history of hypertension was brought to [facility] on 10/15/2016 after patient was found to be confused. As per the family who provided most of the history patient was not able to be raised and family went to check on him at home and was found on the floor confused. Patient was taken to the ER. The last contact was 2 days ago by patient's brother prior to the episode. At [facility] MRI showed multiple acute infarcts involving the both hemisphere of the cerebellum and right occipital lobe right midbrain and left thalamus the fourth ventricle was patent no hydrocephalus. 2-D echo showing EF of 55-60% normal LV size. Carotid Doppler was done which showed right ICA 40-59% stenosis with  left 60-79% stenosis. Chest x-ray was showing infiltrates and patient also was febrile for which patient was started on antibiotics. Patient's creatinine at admission was 2.6 which improved to 1.5 the time of discharge. Patient also had rhabdomyolysis with CK of 7000 at the time of admission. As per the note patient was initially agitated requiring Haldol at the other hospital. Patient's mentation slowly improved gradually and family requested transfer to [facility] after discussing with neurologist at [facility] Dr. Leonel Ramsay      SLP Plan  Continue with current plan of care       Recommendations         Patient may use Passy-Muir Speech Valve: Intermittently with supervision PMSV Supervision: Full         Oral Care Recommendations: Oral care QID Follow up Recommendations: Skilled Nursing facility SLP Visit Diagnosis: Aphonia (R49.1) Plan: Continue with current plan of care       GO               [facility], MA CCC-SLP [phone]  Lynann Beaver 11/08/2016, 10:28 AM

## 2016-11-08 NOTE — Progress Notes (Signed)
Nutrition Follow-up  DOCUMENTATION CODES:   Severe malnutrition in context of acute illness/injury  INTERVENTION:    As medically appropriate:  Initiate Jevity 1.5 formula at 20 ml/hr and increase by 10 ml every 6-8 hours to goal rate of 50 ml/hr  Prostat liquid protein 30 ml BID  Total TF regimen to provide 2000 kcals, 106 gm protein, 912 ml of free water  NUTRITION DIAGNOSIS:   Malnutrition related to acute illness as evidenced by severe depletion of body fat, severe depletion of muscle mass, energy intake < or equal to 50% for > or equal to 5 days  Ongoing  GOAL:   Patient will meet greater than or equal to 90% of their needs   Unmet   MONITOR:   Labs, Weight trends, TF tolerance, Skin, I & O's  ASSESSMENT:   66 yo male with PMH of HTN, stroke who was admitted to [facility] on 7/1 after family found him on the floor confused. MRI showed multiple acute infarcts. He was transferred to [facility] on 7/5 at family's request.   PEG tube placed 7/13 Bedside tracheostomy 7/20  TF (Vital AF 1.2 formula) stopped given vomiting. Medications reviewed and include MVI, thiamine and Reglan. Free water flushes at 100 ml 4 times daily. Labs reviewed. CBG's [id number]. Palliative Care Team following.  Diet Order:  Diet - low sodium heart healthy Diet NPO time specified  Skin:  Wound (see comment) (stage II to R buttocks, L shoulder; DTI to heels)  Last BM:  7/24  Height:   Ht Readings from Last 1 Encounters:  11/07/16 5\' 11"  (1.803 m)   Weight:   Wt Readings from Last 1 Encounters:  11/08/16 163 lb 12.8 oz (74.3 kg)   Ideal Body Weight:  78.2 kg  BMI:  Body mass index is 22.85 kg/m.  Estimated Nutritional Needs:   Kcal:  1850-2050  Protein:  95-110 gm  Fluid:  1.8-2.0 L  EDUCATION NEEDS:   No education needs identified at this time  Arthur Holms, RD, LDN Pager #: [phone] After-Hours Pager #: [phone]

## 2016-11-08 NOTE — Progress Notes (Signed)
Patient seen for trach team follow up.  No equipment needs at this time.  No education given at this time.  No family present to provide any education to.  Currently on 35% trach collar.  Will continue to follow.

## 2016-11-08 NOTE — Progress Notes (Signed)
No charge note:  Called again and left message at home and mobile number for patient's sister, [name]. Requested return call to Palliative Medicine- [phone].  Mariana Kaufman, AGNP-C Palliative Medicine  Please call Palliative Medicine team phone with any questions [phone]. For individual providers please see AMION.

## 2016-11-08 NOTE — Plan of Care (Signed)
Problem: Safety: Goal: Ability to remain free from injury will improve Outcome: Progressing Patient still wearing safety mitts. Skin fully assessed and found to have breakdown in multiple places. Bed alarm being utilized.   Problem: Health Behavior/Discharge Planning: Goal: Ability to manage health-related needs will improve Outcome: Progressing Patient's sister due to have palliative meeting to discuss plans for patient.   Problem: Physical Regulation: Goal: Ability to maintain clinical measurements within normal limits will improve Outcome: Not Progressing Patient had to be placed back on ventilator overnight due to decreased saturations. Patient also vomited a large amount- tube feeds turned off. Patient likely aspirating.

## 2016-11-08 NOTE — Progress Notes (Signed)
[place] TEAM 1 - Stepdown/ICU TEAM  Scott Rivera  HAL:[id number] DOB: 02-26-1951 DOA: 10/19/2016 PCP: Patient, No Pcp Per    Brief Narrative:  66 y.o. male with history of HTN who presented to [facility] on 10/15/2016 after family found him on the floor confused. MRI showed multiple acute infarcts involving both hemispheres of the cerebellum, the right occipital lobe. right midbrain, and left thalamus.  The fourth ventricle was patent/no hydrocephalus. TTE noted EF 55-60% w/ normal LV size. Carotid doppler showed right ICA 40-59% stenosis with left 60-79% stenosis. CXR noted infiltrates and patient was febrile therefore the pt was started on antibiotics. Creatinine at admission was 2.6, but this improved to 1.5.  CK of 7000 was noted at the time of admission. Patient's mentation slowly improved.  Family requested transfer to [facility], and this was accomplished on 7/5.  Significant Events: 7/1 admit to outside hospital 7/5 transfer to [facility] 7/13 PEG tube inserted  7/14 to ICU after vomit w/ aspiration - intubated  7/16 LE venous duplex - no DVT 7/35 TEE - no embolic source - negative bubble study - normal EF 7/18 extubated > recurrent aspiration > re-intubated  7/18 bronchoscopy for culture 7/20 tracheostomy  7/23 TRH resumed care   Subjective: The patient suffered a refractory episode of hypoxemia early this morning which did not respond to upper titration of oxygen support.  He required short-term return to ventilator support.  He has had recurring issues with vomiting.  This morning he is alert and stairs in the examiner but does not respond in any meaningful way.  He does not follow simple commands.  His mental status appears to be consistent with that of prior exams from this dictator.  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 - Neuro suggests "dual antiplatelet therapy for 3 months followed by Plavix alone" - will need extensive PT/OT and SLP - to consider loop recorder at outpt - TEE w/o evidence of embolic source - plan is for SNF placement   FUO - Sinus tachycardia  Temp has remained below 101 since 7/22 - more than likely this is due to aspiration w/ pneumonitis and possible developing PNA - f/u CXR notes no signif change in bibasilar infiltrates - blood cx no growth thus far - cont to avoid abx as able and follow   [name] - follow w/o tx for now   Atrial septal aneurysm TEE ruled out embolic source  Dysphagia Pt has PEG tube in place - ongoing aspiration remains a problem - trial of reglan to determine if it will help decrease reflux ongoing - hold tube feeds for 24hrs and work to stimulate bowels   HLD Cont tx  HTN Blood pressure remains uncontrolled - adjust tx again and follow - suspect pneumonitis and dyspnea related anxiety likely contributing   Rhabdomyolysis CK normalized  Acute renal failure - resolved  Creatinine peaked at 2.6 - renal function has normalized  Recent Labs Lab 11/04/16 0251 11/05/16 0647 11/06/16 0345 11/07/16 0802 11/08/16 0521  CREATININE 1.31* 1.06 1.05 0.97  0.97    History of alcoholism  Mild transaminits  resolved - etiology unclear - viral hep panel negative - likely shock liver   Tobacco abuse  Severe malnutrition in context of acute illness  Normocytic anemia Likely nutrition related - no evidence of blood loss - follow trend   DVT prophylaxis: SCDs Code Status: FULL CODE Family Communication: no family present at time of exam     Disposition Plan: SDU  Consultants:  Neurology Palliative Care   Procedures: none  Antimicrobials:  None presently  Last dose of Zosyn 7/21  Objective: Blood pressure (!) 165/88, pulse (!) 113, temperature 99.3 F (37.4 C), temperature source Oral, resp. rate (!) 29, height _0  (1.803 m), weight 74.3 kg (163 lb 12.8 oz), SpO2 99 %.  Intake/Output Summary (Last 24 hours) at 11/08/16 0859 Last data filed at 11/08/16 0114  Gross per 24 hour  Intake          1949.58 ml  Output             1050 ml  Net           899.58 ml   Filed Weights   11/07/16 0317 11/07/16 1918 11/08/16 0500  Weight: 74.8 kg (164 lb 14.5 oz) 74.3 kg (163 lb 12.8 oz) 74.3 kg (163 lb 12.8 oz)    Examination: General: respirations unlabored - pt non-communicative  Lungs: bibasilar crackles - no wheeze  Cardiovascular: tachycardic - regular - no M Abdomen: NT/ND, soft, bowel sounds positive - PEG insertion clean and dressed Extremities: No edema lower extremities  CBC:  Recent Labs Lab 11/04/16 0251 11/05/16 0402 11/06/16 0345 11/07/16 0802 11/08/16 0521  WBC 17.6* 18.1* 30.4* 27.7* 27.6*  NEUTROABS  --   --   --   --  21.2*  HGB 8.2* 8.6* 9.1* 8.5* 7.5*  HCT 25.9* 28.1* 27.5* 26.6* 23.2*  MCV 87.8 91.5 86.5 87.8 86.9  PLT 593* 400 687* 600* 616*   Basic Metabolic Panel:  Recent Labs Lab 11/04/16 0251 11/05/16 0647 11/06/16 0345 11/07/16 0802 11/08/16 0521  NA 142 141 137 139 139  K 4.2 3.9 5.4* 3.8 4.0  CL 112* 108 103 107 105  CO2 _1 GLUCOSE 116* 115* 95 105* 89  BUN _2 CREATININE 1.31* 1.06 1.05 0.97 0.97  CALCIUM 9.1 9.4 9.4 9.4 9.8  MG 1.9  --   --  1.7 1.8  PHOS 3.2  --   --   --   --    GFR: Estimated Creatinine Clearance: 78.7 mL/min (by C-G formula based on SCr of 0.97 mg/dL).  Liver Function Tests: No results for input(s): AST, ALT, ALKPHOS, BILITOT, PROT, ALBUMIN in the last 168 hours.   Recent Results (from the past 240 hour(s))   Culture, bal-quantitative     Status: Abnormal   Collection Time: 11/01/16  4:15 PM  Result Value Ref Range Status   Specimen Description BRONCHIAL ALVEOLAR LAVAGE  Final   Special Requests NONE  Final   Gram Stain   Final    ABUNDANT WBC PRESENT, PREDOMINANTLY PMN RARE GRAM POSITIVE RODS    Culture (A)  Final    60,000 COLONIES/mL Consistent with normal respiratory flora.   Report Status 11/04/2016 FINAL  Final  Culture, Urine     Status: Abnormal   Collection Time: 11/06/16  3:32 PM  Result Value Ref Range Status   Specimen Description URINE, RANDOM  Final   Special  Requests NONE  Final   Culture >=100,000 COLONIES/mL YEAST (A)  Final   Report Status 11/07/2016 FINAL  Final  Culture, blood (routine x 2)     Status: None (Preliminary result)   Collection Time: 11/06/16  4:14 PM  Result Value Ref Range Status   Specimen Description BLOOD RIGHT HAND  Final   Special Requests IN PEDIATRIC BOTTLE Blood Culture adequate volume  Final   Culture NO GROWTH < 24 HOURS  Final   Report Status PENDING  Incomplete  Culture, blood (routine x 2)     Status: None (Preliminary result)   Collection Time: 11/06/16  4:46 PM  Result Value Ref Range Status   Specimen Description BLOOD LEFT ARM  Final   Special Requests IN PEDIATRIC BOTTLE Blood Culture adequate volume  Final   Culture NO GROWTH < 24 HOURS  Final   Report Status PENDING  Incomplete     Scheduled Meds: . amLODipine  10 mg Per Tube Daily  . aspirin  325 mg Per Tube Daily  . chlorhexidine gluconate (MEDLINE KIT)  15 mL Mouth Rinse BID  . famotidine  20 mg Per Tube BID  . feeding supplement (PRO-STAT SUGAR FREE 64)  30 mL Per Tube Daily  . free water  200 mL Per Tube QID  . heparin subcutaneous  5,000 Units Subcutaneous Q8H  . insulin aspart  0-9 Units Subcutaneous Q4H  . lactulose  10 g Per Tube TID  . mouth rinse  15 mL Mouth Rinse QID  . metoCLOPramide (REGLAN) injection  5 mg Intravenous Q6H  . metoprolol tartrate  50 mg  Per Tube BID  . multivitamin with minerals  1 tablet Per Tube Daily  . nicotine  14 mg Transdermal Daily  . pravastatin  20 mg Per Tube q1800  . thiamine  100 mg Per Tube Daily     LOS: 20 days   Cherene Altes, MD [facility] Office  [phone] Pager - Text Page per Amion as per below:  On-Call/Text Page:      [email or web address]      password [email or web address]  If 7PM-7AM, please contact night-coverage [email or web address] Password [email or web address] 11/08/2016, 8:59 AM

## 2016-11-08 NOTE — Progress Notes (Signed)
Patient placed back on ventilator by RT due to patient's inability to maintain sats about 87 on trach collar. Patient was having suspicious secretions. Concerned that patient was aspirated/ coughing up tube feed. After placed on vent; patient set up in bed and repositioned after which patient began vomiting. Patient had been given medication via PEG tube and is on continuous tube feedings prior to this incident. Notified Tylene Fantasia, NP about event and given instruction to turn off tube feedings and hold off on medication until day shift. Will continue to monitor patient closely.   Milford Cage, RN

## 2016-11-08 NOTE — Care Management Note (Addendum)
Case Management Note  Patient Details  Name: Scott Rivera MRN: [id number] Date of Birth: 08-08-1950  Subjective/Objective:  Transfer from [place] , Multiple strokes, FUO, Atrial septal aneurysm,dysphagia, HLD, HTN, Rhabdo, ARF,  per previous NCM note, CSW following for SNF and awaiting peg placement.  For possible out of state placement if he does not wean quickly,was having heavy secretions, on 7/20 patient was not appropriate for [facility] referral- will like to wait and see how patient weans.      7/25 [place], BSN- was back on vent last pm, had a episode of hypoxemia ,  starting vomiting and aspirating each time per RN, he had been off vent for 2 days. He does not follow simple commands.     7/31 [place], BSN - Severe Sepsis, Resp failure, asp pna, cva, atrial septal aneurysm, dysphagia, trach 28% (6 shiley cuffed) on going aspiration , advancing tube feeds slowly, conts on ivf', iv abx. Plan is SNF.  Discussed in LOS 7/31, appropriate for continued stay.                Action/Plan: NCM will follow for dc needs.   Expected Discharge Date:  10/25/16               Expected Discharge Plan:  Skilled Nursing Facility  In-House Referral:  Clinical Social Work  Discharge planning Services  CM Consult  Post Acute Care Choice:  IP Rehab Choice offered to:     DME Arranged:    DME Agency:     HH Arranged:    [place] Agency:     Status of Service:  In process, will continue to follow  If discussed at Long Length of Stay Meetings, dates discussed:    Additional Comments:  Zenon Mayo, RN 11/08/2016, 9:32 AM

## 2016-11-08 NOTE — Progress Notes (Signed)
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  Transferred to [facility] on 7/5, rhabdomyolysis resolved, treated for aspiration with 7 days of antibiotics, had severe dysarthria &dysphagia requiring PEG 7/13 and had another aspiration episode 7/15 requiring mechanical ventilation. Unfortunately, failed extubation on 7/18 and required reintubation, then trach.  Subjective: Denies chest pain.  Vital signs: BP (!) 165/88   Pulse (!) 113   Temp 99.3 F (37.4 C) (Oral)   Resp (!) 29   Ht 5\' 11"  (1.803 m)   Wt 163 lb 12.8 oz (74.3 kg)   SpO2 99%   BMI 22.85 kg/m   Intake/output: I/O last 3 completed shifts: In: 2794.6 [I.V.:120; Other:220; NG/GT:2454.6] Out: 2250 [Urine:2250]  General: thin Neuro: alert HEENT: trach site clean Cardiac: regular Chest: no wheeze Abd: soft, non tender Ext: no edema Skin: no rashes   CMP Latest Ref Rng & Units 11/08/2016 11/07/2016 11/06/2016  Glucose 65 - 99 mg/dL 89 105(H) 95  BUN 6 - 20 mg/dL 17 13 16   Creatinine 0.61 - 1.24 mg/dL 0.97 0.97 1.05  Sodium 135 - 145 mmol/L 139 139 137  Potassium 3.5 - 5.1 mmol/L 4.0 3.8 5.4(H)  Chloride 101 - 111 mmol/L 105 107 103  CO2 22 - 32 mmol/L 25 24 26   Calcium 8.9 - 10.3 mg/dL 9.8 9.4 9.4  Total Protein 6.5 - 8.1 g/dL - - -  Total Bilirubin 0.3 - 1.2 mg/dL - - -  Alkaline Phos 38 - 126 U/L - - -  AST 15 - 41 U/L - - -  ALT 17 - 63 U/L - - -     CBC Latest Ref Rng & Units 11/08/2016 11/07/2016 11/06/2016  WBC 4.0 - 10.5 K/uL 27.6(H) 27.7(H) 30.4(H)  Hemoglobin 13.0 - 17.0 g/dL 7.5(L) 8.5(L) 9.1(L)  Hematocrit 39.0 - 52.0 % 23.2(L) 26.6(L) 27.5(L)  Platelets 150 - 400 K/uL 710(H) 600(H) 687(H)     ABG    Component Value Date/Time   PHART 7.499 (H) 11/07/2016 1254   PCO2ART 34.1 11/07/2016 1254   PO2ART  64.0 (L) 11/07/2016 1254   HCO3 26.4 11/07/2016 1254   TCO2 27 11/07/2016 1254   ACIDBASEDEF 2.0 10/29/2016 0905   O2SAT 94.0 11/07/2016 1254     CBG (last 3)   Recent Labs  11/07/16 2359 11/08/16 0308 11/08/16 0722  GLUCAP 114* 97 86     Imaging: Dg Abd 1 View  Result Date: 11/06/2016 CLINICAL DATA:  Constipation. EXAM: ABDOMEN - 1 VIEW COMPARISON:  Radiograph of October 29, 2016. FINDINGS: No abnormal bowel dilatation is noted. Gastrostomy tube is seen projected over gastric air bubble. Midline surgical staples are noted. Moderate amount of stool is seen throughout the colon. IMPRESSION: Moderate stool burden is noted. Gastrostomy tube is in grossly good position. No abnormal bowel dilatation is noted. Electronically Signed   By: Marijo Conception, M.D.   On: 11/06/2016 09:00   Dg Chest Port 1 View  Result Date: 11/07/2016 CLINICAL DATA:  Aspiration pneumonia. EXAM: PORTABLE CHEST 1 VIEW COMPARISON:  11/05/2016 FINDINGS: Tracheostomy tube overlies the airway. Right subclavian catheter has been removed. The cardiomediastinal silhouette is unchanged. Patchy airspace opacities are again seen in both lung bases, stable to minimally improved from the prior study. No sizable pleural effusion or pneumothorax is identified. Surgical clips project over the  left lower neck and left chest wall. Well-defined sclerosis is again partially visualized in the left humeral head. IMPRESSION: Stable to minimal improvement of bibasilar airspace disease. Electronically Signed   By: Logan Bores M.D.   On: 11/07/2016 09:09    Lines/tubes: Lurline Idol 7/20  Assessment/plan:  Acute respiratory failure 2nd to aspiration pneumonia in setting of CVA. Failure to wean s/p tracheostomy. - d/c trach sutures on 7/30 - continue trach collar - f/u with speech therapy  PCCM will f/u next for trach care.  Call if help needed sooner.  Chesley Mires, MD [facility] Pulmonary/Critical Care 11/08/2016, 8:44 AM Pager:   [phone] After 3pm call: [phone]

## 2016-11-08 NOTE — Progress Notes (Signed)
RT called by RN to patient room for desat. Upon arrival patients sats were in the low to mid 80's and sustaining. RT changed inner cannula and lavaged patient removing only a small amount of white tan secretions. Patients sats dropped to 81 and only increased to 86 but dropped again. RT placed patient back on vent and patients sats increased to 99%. RN aware. Will continue to monitor.

## 2016-11-09 DIAGNOSIS — K5900 Constipation, unspecified: Secondary | ICD-10-CM

## 2016-11-09 DIAGNOSIS — E784 Other hyperlipidemia: Secondary | ICD-10-CM

## 2016-11-09 LAB — BASIC METABOLIC PANEL
Anion gap: 10 (ref 5–15)
BUN: 16 mg/dL (ref 6–20)
CO2: 26 mmol/L (ref 22–32)
Calcium: 9.8 mg/dL (ref 8.9–10.3)
Chloride: 104 mmol/L (ref 101–111)
Creatinine, Ser: 1 mg/dL (ref 0.61–1.24)
GFR calc Af Amer: >60 mL/min (ref >60)
GFR calc non Af Amer: >60 mL/min (ref >60)
Glucose, Bld: 80 mg/dL (ref 65–99)
Potassium: 4.5 mmol/L (ref 3.5–5.1)
Sodium: 140 mmol/L (ref 135–145)

## 2016-11-09 LAB — GLUCOSE, CAPILLARY
Glucose-Capillary: 73 mg/dL (ref 65–99)
Glucose-Capillary: 74 mg/dL (ref 65–99)
Glucose-Capillary: 80 mg/dL (ref 65–99)
Glucose-Capillary: 80 mg/dL (ref 65–99)
Glucose-Capillary: 90 mg/dL (ref 65–99)
Glucose-Capillary: 94 mg/dL (ref 65–99)
Glucose-Capillary: 98 mg/dL (ref 65–99)

## 2016-11-09 LAB — CBC
HCT: 28 % — ABNORMAL LOW (ref 39.0–52.0)
Hemoglobin: 8.9 g/dL — ABNORMAL LOW (ref 13.0–17.0)
MCH: 27.9 pg (ref 26.0–34.0)
MCHC: 31.8 g/dL (ref 30.0–36.0)
MCV: 87.8 fL (ref 78.0–100.0)
Platelets: 808 10*3/uL — ABNORMAL HIGH (ref 150–400)
RBC: 3.19 MIL/uL — ABNORMAL LOW (ref 4.22–5.81)
RDW: 15.8 % — ABNORMAL HIGH (ref 11.5–15.5)
WBC: 22.1 10*3/uL — ABNORMAL HIGH (ref 4.0–10.5)

## 2016-11-09 LAB — MAGNESIUM: Magnesium: 2 mg/dL (ref 1.7–2.4)

## 2016-11-09 MED ORDER — FLEET ENEMA 7-19 GM/118ML RE ENEM
1.0000 | ENEMA | Freq: Every day | RECTAL | Status: DC
  Administered 2016-11-09 – 2016-11-11 (×3): 1 via RECTAL
  Filled 2016-11-09 (×4): qty 1

## 2016-11-09 MED ORDER — MAGNESIUM CITRATE PO SOLN
1.0000 | Freq: Once | ORAL | Status: AC
  Administered 2016-11-09: 1
  Filled 2016-11-09: qty 296

## 2016-11-09 NOTE — Progress Notes (Signed)
Patient has had 2 large loose BM post constipation interventions.
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
## 2016-11-09 NOTE — Progress Notes (Signed)
PROGRESS NOTE    Scott Rivera  TDD:[id number] DOB: 12-Sep-1950 DOA: 10/19/2016 PCP: Patient, No Pcp Per   Brief Narrative:  66 y.o.BM PMHx HTN, CVA,  Presented to [facility] on 10/15/2016 after family found him on the floor confused. MRI showed multiple acute infarcts involving both hemispheres of the cerebellum, the right occipital lobe. right midbrain, and left thalamus.  The fourth ventricle was patent/no hydrocephalus. TTE noted EF 55-60% w/ normal LV size. Carotid doppler showed right ICA 40-59% stenosis with left 60-79% stenosis. CXR noted infiltrates and patient was febrile therefore the pt was started on antibiotics. Creatinine at admission was 2.6, but this improved to 1.5.  CK of 7000 was noted at the time of admission. Patient's mentation slowly improved.  Family requested transfer to [facility], and this was accomplished on 7/5.   Subjective: 7/26 Afebrile last 24 hours, but continues to have elevated temp.opens eyes with painful stimulation, does  follow commands.   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[place]) Active Problems:   ARF (acute renal failure) (HCC)   Essential hypertension   Rhabdomyolysis   Acute ischemic stroke (HCC)   Pressure injury of skin   Dysphagia, post-stroke   Benign essential HTN   Tobacco abuse   Tachypnea   Hyperglycemia   Hypernatremia   Leukocytosis   Acute blood loss anemia   Chronic kidney disease   Septic shock (HCC)   Acute respiratory failure (HCC)   Aspiration into airway   Severe sepsis / Left lung Aspiration PNA with possible mucous plugging/Recurrent aspiration  -S/p trach 7/20 - appears to have persisting problem w/ aspiration - cont all possible interventions to prevent as able  -Patient with possible developing pneumonitis vs new aspiration pneumonia  Multiple Acute strokes B posterior cerebral hemispheres -  -Minimally responsive -Care per Neurology/Stroke team  - 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 Currently patient unable to participate even for evaluation. -Per speech able to tolerate PMV  - to consider loop recorder at outpt  - TEE w/o evidence of embolic source   FUO - - afebrile last 24 hours, but continued elevation of temp   - more than likely this is indicative of another episode of aspiration w/ pneumonitis and possible developing PNA vs funguria -7/24 PCXR: No significant change  Funguria? -Patient continues to have elevated temperature and leukocytosis, normally do not treat yeast in the urine but given patient's fragile state and multiple medical problems will treat. -  Atrial septal aneurysm -TEE ruled out embolic source  Sinus tachycardia -Amlodipine 10 mg daily -Hydralazine PRN -Metoprolol 100 mg BID  Essential HTN -See sinus tachycardia    Dysphagia -Pt now has PEG tube in place - ongoing aspiration remains a problem - trial of reglan to determine if it will help decrease reflux   Ileus vs Obstipation -7/26 One bottle magnesium citrate -Lactulose BID -Reglan QID -Fleet Enema daily -If above measures fail will consult GI/surgery for intervention doubt patient is candidate given his multiple medical conditions. Explained this to sister  HLD -Pravachol 20 mg daily   Rhabdomyolysis -Resolved  Acute renal failure Creatinine peaked at 2.6 - renal function has normalized Lab Results  Component Value Date   CREATININE 1.00 11/09/2016   CREATININE 0.97 11/08/2016   CREATININE 0.97 11/07/2016  History of alcoholism  Mild transaminits  -resolved - etiology unclear - viral hep panel negative - likely shock liver   Tobacco abuse  Severe malnutrition in context of acute illness -PEG tube placed   DVT prophylaxis: SCD Code Status: Full Family  Communication: Spoke at great length with sister explained poor prognosis Disposition Plan: SNF when stable   Consultants:  Neurology PC CM   Procedures/Significant Events:  7/1 admit to outside hospital 7/5 transfer to [facility] 7/13 PEG tube inserted  7/14 to ICU after vomit w/ aspiration - intubated  7/16 LE venous duplex - no DVT 3/47 TEE - no embolic source - negative bubble study - normal EF 7/18 extubated > recurrent aspiration > re-intubated  7/18 bronchoscopy for culture 7/20 tracheostomy  7/23 TRH resumed care    VENTILATOR SETTINGS: None   Cultures 7/6 MRSA by PCR negative 7/15 endotracheal normal flora 7/15 blood negative final 7/18 BAL normal flora 7/23 urine positive yeast 7/23 blood NGTD     Antimicrobials: Anti-infectives    Start     Stop   10/29/16 1200  vancomycin (VANCOCIN) IVPB 1000 mg/200 mL premix  Status:  Discontinued     10/31/16 1140   10/29/16 0600  piperacillin-tazobactam (ZOSYN) IVPB 3.375 g     11/05/16 0138   10/29/16 0100  vancomycin (VANCOCIN) 1,500 mg in sodium chloride 0.9 % 500 mL IVPB     10/29/16 0539   10/29/16 0100  piperacillin-tazobactam (ZOSYN) IVPB 3.375 g     10/29/16 0410   10/29/16 0045  piperacillin-tazobactam (ZOSYN) IVPB 3.375 g  Status:  Discontinued     10/29/16 0036   10/29/16 0045  vancomycin (VANCOCIN) IVPB 1000 mg/200 mL premix  Status:  Discontinued     10/29/16 0037   10/26/16 1000  amoxicillin-clavulanate (AUGMENTIN) 600-42.9 MG/5ML suspension 875 mg  Status:  Discontinued     10/29/16 0034   10/25/16 1000  ceFEPIme (MAXIPIME) 1 g in dextrose 5 % 50 mL IVPB     10/25/16 2345   10/20/16 2200  ceFEPIme (MAXIPIME) 1 g in dextrose 5 % 50 mL IVPB  Status:  Discontinued     10/25/16 0922   10/19/16 2300  ceFEPIme (MAXIPIME) 1 g in dextrose 5 % 50 mL IVPB  Status:  Discontinued     10/20/16 1047   10/19/16 2200  vancomycin (VANCOCIN) IVPB 750 mg/150 ml premix  Status:  Discontinued     10/20/16 1131    10/19/16 2115  vancomycin (VANCOCIN) 1,500 mg in sodium chloride 0.9 % 500 mL IVPB  Status:  Discontinued     10/19/16 2117   10/19/16 2115  ceFEPIme (MAXIPIME) 2 g in dextrose 5 % 50 mL IVPB  Status:  Discontinued     10/19/16 2120       Devices None   LINES / TUBES:  None    Continuous Infusions:    Objective: Vitals:   11/09/16 0400 11/09/16 0500 11/09/16 0733 11/09/16 0740  BP: (!) 177/137 (!) 147/73 (!) 170/61   Pulse: (!) 113 (!) 104 (!) 110   Resp: (!) 35 (!) 25 (!) 29   Temp:    100.2 F (37.9 C)  TempSrc:    Axillary  SpO2: 92% 98% 93%   Weight:      Height:        Intake/Output Summary (Last 24 hours) at 11/09/16 0747 Last data filed at 11/09/16 0330  Gross per 24 hour  Intake  400 ml  Output             1750 ml  Net            -1350 ml   Filed Weights   11/07/16 1918 11/08/16 0500 11/09/16 0328  Weight: 163 lb 12.8 oz (74.3 kg) 163 lb 12.8 oz (74.3 kg) 163 lb 2.3 oz (74 kg)    Examination:  General: A/O 0, opens eyes to painful stimuli, responds to commands, positive acute respiratory distress Eyes: negative scleral hemorrhage, negative anisocoria, negative icterus ENT: Negative Runny nose, negative gingival bleeding,, poor dentation Neck:  Negative scars, masses, torticollis, lymphadenopathy, JVD #6 trach in place, negative sign of infection.  Lungs: tachypnea, decreased air movement by basilar, negative wheezes, negative crackles  Cardiovascular: Regular rhythm and rate without murmur gallop or rub normal S1 and S2 Abdomen: negative abdominal pain, nondistended, but firm to palpation, positive soft, bowel sounds, no rebound, no ascites, no appreciable mass, PEG tube in place negative sign of infection Extremities: No significant cyanosis, clubbing, or edema bilateral lower extremities Skin: Negative rashes, lesions, ulcers Psychiatric:  Unable to assess secondary to patient's altered mental status  Central nervous system:  Opens eyes  to painful stimuli, all commands lifted right and left arm, on command with old right and left toes.   .     Data Reviewed: Care during the described time interval was provided by me .  I have reviewed this patient's available data, including medical history, events of note, physical examination, and all test results as part of my evaluation. I have personally reviewed and interpreted all radiology studies.  CBC:  Recent Labs Lab 11/05/16 0402 11/06/16 0345 11/07/16 0802 11/08/16 0521 11/09/16 0302  WBC 18.1* 30.4* 27.7* 27.6* 22.1*  NEUTROABS  --   --   --  21.2*  --   HGB 8.6* 9.1* 8.5* 7.5* 8.9*  HCT 28.1* 27.5* 26.6* 23.2* 28.0*  MCV 91.5 86.5 87.8 86.9 87.8  PLT 400 687* 600* 710* 354*   Basic Metabolic Panel:  Recent Labs Lab 11/04/16 0251 11/05/16 0647 11/06/16 0345 11/07/16 0802 11/08/16 0521 11/09/16 0302  NA 142 141 137 139 139 140  K 4.2 3.9 5.4* 3.8 4.0 4.5  CL 112* 108 103 107 105 104  CO2 _0 GLUCOSE 116* 115* 95 105* 89 80  BUN _1 CREATININE 1.31* 1.06 1.05 0.97 0.97 1.00  CALCIUM 9.1 9.4 9.4 9.4 9.8 9.8  MG 1.9  --   --  1.7 1.8 2.0  PHOS 3.2  --   --   --   --   --    GFR: Estimated Creatinine Clearance: 76.1 mL/min (by C-G formula based on SCr of 1 mg/dL). Liver Function Tests: No results for input(s): AST, ALT, ALKPHOS, BILITOT, PROT, ALBUMIN in the last 168 hours. No results for input(s): LIPASE, AMYLASE in the last 168 hours. No results for input(s): AMMONIA in the last 168 hours. Coagulation Profile:  Recent Labs Lab 11/03/16 0846  INR 1.21   Cardiac Enzymes: No results for input(s): CKTOTAL, CKMB, CKMBINDEX, TROPONINI in the last 168 hours. BNP (last 3 results) No results for input(s): PROBNP in the last 8760 hours. HbA1C: No results for input(s): HGBA1C in the last 72 hours. CBG:  Recent Labs Lab 11/08/16 1247 11/08/16 1649 11/08/16 2040 11/09/16 0050 11/09/16 0326  GLUCAP 89 82 84 80 74    Lipid Profile: No results for input(s):  CHOL, HDL, LDLCALC, TRIG, CHOLHDL, LDLDIRECT in the last 72 hours. Thyroid Function Tests: No results for input(s): TSH, T4TOTAL, FREET4, T3FREE, THYROIDAB in the last 72 hours. Anemia Panel: No results for input(s): VITAMINB12, FOLATE, FERRITIN, TIBC, IRON, RETICCTPCT in the last 72 hours. Urine analysis:    Component Value Date/Time   COLORURINE YELLOW 11/06/2016 1532   APPEARANCEUR HAZY (A) 11/06/2016 1532   LABSPEC 1.011 11/06/2016 1532   PHURINE 5.0 11/06/2016 1532   GLUCOSEU NEGATIVE 11/06/2016 1532   HGBUR MODERATE (A) 11/06/2016 1532   BILIRUBINUR NEGATIVE 11/06/2016 1532   KETONESUR NEGATIVE 11/06/2016 1532   PROTEINUR 30 (A) 11/06/2016 1532   NITRITE NEGATIVE 11/06/2016 1532   LEUKOCYTESUR LARGE (A) 11/06/2016 1532   Sepsis Labs: _0 (procalcitonin:4,lacticidven:4)  ) Recent Results (from the past 240 hour(s))  Culture, bal-quantitative     Status: Abnormal   Collection Time: 11/01/16  4:15 PM  Result Value Ref Range Status   Specimen Description BRONCHIAL ALVEOLAR LAVAGE  Final   Special Requests NONE  Final   Gram Stain   Final    ABUNDANT WBC PRESENT, PREDOMINANTLY PMN RARE GRAM POSITIVE RODS    Culture (A)  Final    60,000 COLONIES/mL Consistent with normal respiratory flora.   Report Status 11/04/2016 FINAL  Final  Culture, Urine     Status: Abnormal   Collection Time: 11/06/16  3:32 PM  Result Value Ref Range Status   Specimen Description URINE, RANDOM  Final   Special Requests NONE  Final   Culture >=100,000 COLONIES/mL YEAST (A)  Final   Report Status 11/07/2016 FINAL  Final  Culture, blood (routine x 2)     Status: None (Preliminary result)   Collection Time: 11/06/16  4:14 PM  Result Value Ref Range Status   Specimen Description BLOOD RIGHT HAND  Final   Special Requests IN PEDIATRIC BOTTLE Blood Culture adequate volume  Final   Culture NO GROWTH 2 DAYS  Final   Report Status PENDING  Incomplete   Culture, blood (routine x 2)     Status: None (Preliminary result)   Collection Time: 11/06/16  4:46 PM  Result Value Ref Range Status   Specimen Description BLOOD LEFT ARM  Final   Special Requests IN PEDIATRIC BOTTLE Blood Culture adequate volume  Final   Culture NO GROWTH 2 DAYS  Final   Report Status PENDING  Incomplete         Radiology Studies: Dg Chest Port 1 View  Result Date: 11/07/2016 CLINICAL DATA:  Aspiration pneumonia. EXAM: PORTABLE CHEST 1 VIEW COMPARISON:  11/05/2016 FINDINGS: Tracheostomy tube overlies the airway. Right subclavian catheter has been removed. The cardiomediastinal silhouette is unchanged. Patchy airspace opacities are again seen in both lung bases, stable to minimally improved from the prior study. No sizable pleural effusion or pneumothorax is identified. Surgical clips project over the left lower neck and left chest wall. Well-defined sclerosis is again partially visualized in the left humeral head. IMPRESSION: Stable to minimal improvement of bibasilar airspace disease. Electronically Signed   By: Logan Bores M.D.   On: 11/07/2016 09:09   Dg Abd Portable 1v  Result Date: 11/08/2016 CLINICAL DATA:  Vomiting, ileus EXAM: PORTABLE ABDOMEN - 1 VIEW COMPARISON:  11/06/2016 FINDINGS: Gastrostomy tube projects over the stomach. Gas throughout mildly prominent large and small bowel loops, similar to prior study. Moderate stool burden. No visible free air organomegaly. IMPRESSION: Stable gas throughout mildly prominent large and small bowel loops. No change. Electronically Signed   By: Lennette Bihari  [place] M.D.   On: 11/08/2016 10:37        Scheduled Meds: . amLODipine  10 mg Per Tube Daily  . aspirin  325 mg Per Tube Daily  . chlorhexidine gluconate (MEDLINE KIT)  15 mL Mouth Rinse BID  . cloNIDine  0.1 mg Per Tube BID  . famotidine  20 mg Per Tube BID  . feeding supplement (PRO-STAT SUGAR FREE 64)  30 mL Per Tube Daily  . free water  100 mL Per Tube QID  .  heparin subcutaneous  5,000 Units Subcutaneous Q8H  . insulin aspart  0-9 Units Subcutaneous Q4H  . lactulose  10 g Per Tube BID  . mouth rinse  15 mL Mouth Rinse QID  . metoCLOPramide (REGLAN) injection  10 mg Intravenous Q6H  . metoprolol tartrate  100 mg Per Tube BID  . multivitamin with minerals  1 tablet Per Tube Daily  . nicotine  14 mg Transdermal Daily  . pravastatin  20 mg Per Tube q1800  . thiamine  100 mg Per Tube Daily   Continuous Infusions:    LOS: 21 days    Time spent: 40 minutes    Tyresha Fede, Geraldo Docker, MD [facility] Pager [phone]   If 7PM-7AM, please contact night-coverage [email or web address] Password [email or web address] 11/09/2016, 7:47 AM

## 2016-11-09 NOTE — Progress Notes (Signed)
Patient administered ordered meds for constipation including enema at 1230 with small stool colored water only as result; approx 42mL. MD made aware.

## 2016-11-09 NOTE — Clinical Social Work Note (Signed)
CSW continues to follow for discharge needs.  Tris Howell, CSW [phone]  

## 2016-11-10 ENCOUNTER — Inpatient Hospital Stay (HOSPITAL_COMMUNITY): Payer: Medicare HMO

## 2016-11-10 DIAGNOSIS — J189 Pneumonia, unspecified organism: Secondary | ICD-10-CM

## 2016-11-10 DIAGNOSIS — Z515 Encounter for palliative care: Secondary | ICD-10-CM

## 2016-11-10 DIAGNOSIS — Z7189 Other specified counseling: Secondary | ICD-10-CM

## 2016-11-10 LAB — CBC WITH DIFFERENTIAL/PLATELET
Basophils Absolute: 0 10*3/uL (ref 0.0–0.1)
Basophils Relative: 0 %
Eosinophils Absolute: 0.4 10*3/uL (ref 0.0–0.7)
Eosinophils Relative: 2 %
HCT: 24.2 % — ABNORMAL LOW (ref 39.0–52.0)
Hemoglobin: 7.7 g/dL — ABNORMAL LOW (ref 13.0–17.0)
Lymphocytes Relative: 15 %
Lymphs Abs: 2.8 10*3/uL (ref 0.7–4.0)
MCH: 28.2 pg (ref 26.0–34.0)
MCHC: 31.8 g/dL (ref 30.0–36.0)
MCV: 88.6 fL (ref 78.0–100.0)
Monocytes Absolute: 1.8 10*3/uL — ABNORMAL HIGH (ref 0.1–1.0)
Monocytes Relative: 10 %
Neutro Abs: 13.4 10*3/uL — ABNORMAL HIGH (ref 1.7–7.7)
Neutrophils Relative %: 73 %
Platelets: 791 10*3/uL — ABNORMAL HIGH (ref 150–400)
RBC: 2.73 MIL/uL — ABNORMAL LOW (ref 4.22–5.81)
RDW: 15.9 % — ABNORMAL HIGH (ref 11.5–15.5)
WBC: 18.4 10*3/uL — ABNORMAL HIGH (ref 4.0–10.5)

## 2016-11-10 LAB — GLUCOSE, CAPILLARY
Glucose-Capillary: 78 mg/dL (ref 65–99)
Glucose-Capillary: 78 mg/dL (ref 65–99)
Glucose-Capillary: 90 mg/dL (ref 65–99)
Glucose-Capillary: 85 mg/dL (ref 65–99)
Glucose-Capillary: 86 mg/dL (ref 65–99)
Glucose-Capillary: 98 mg/dL (ref 65–99)

## 2016-11-10 LAB — MAGNESIUM: Magnesium: 1.9 mg/dL (ref 1.7–2.4)

## 2016-11-10 LAB — BASIC METABOLIC PANEL
Anion gap: 9 (ref 5–15)
BUN: 15 mg/dL (ref 6–20)
CO2: 25 mmol/L (ref 22–32)
Calcium: 9.5 mg/dL (ref 8.9–10.3)
Chloride: 105 mmol/L (ref 101–111)
Creatinine, Ser: 0.95 mg/dL (ref 0.61–1.24)
GFR calc Af Amer: >60 mL/min (ref >60)
GFR calc non Af Amer: >60 mL/min (ref >60)
Glucose, Bld: 88 mg/dL (ref 65–99)
Potassium: 4.1 mmol/L (ref 3.5–5.1)
Sodium: 139 mmol/L (ref 135–145)

## 2016-11-10 LAB — LACTIC ACID, PLASMA: Lactic Acid, Venous: 0.8 mmol/L (ref 0.5–1.9)

## 2016-11-10 MED ORDER — FLUCONAZOLE IN SODIUM CHLORIDE 200-0.9 MG/100ML-% IV SOLN
200.0000 mg | INTRAVENOUS | Status: DC
  Administered 2016-11-10 – 2016-11-13 (×4): 200 mg via INTRAVENOUS
  Filled 2016-11-10 (×4): qty 100

## 2016-11-10 MED ORDER — DEXTROSE-NACL 5-0.45 % IV SOLN
INTRAVENOUS | Status: DC
  Administered 2016-11-10: 1000 mL via INTRAVENOUS
  Administered 2016-11-11 – 2016-11-13 (×3): via INTRAVENOUS

## 2016-11-10 MED ORDER — FLUCONAZOLE IN SODIUM CHLORIDE 200-0.9 MG/100ML-% IV SOLN: 200.0000 mg | Freq: Once | INTRAVENOUS | Status: DC

## 2016-11-10 NOTE — Progress Notes (Signed)
PROGRESS NOTE    EDIEL UNANGST  CVE:[id number] DOB: [birth year] DOA: 10/19/2016 PCP: Patient, No Pcp Per   Brief Narrative:  66 y.o.BM PMHx HTN, CVA,  Presented to [facility] on 10/15/2016 after family found him on the floor confused. MRI showed multiple acute infarcts involving both hemispheres of the cerebellum, the right occipital lobe. right midbrain, and left thalamus.  The fourth ventricle was patent/no hydrocephalus. TTE noted EF 55-60% w/ normal LV size. Carotid doppler showed right ICA 40-59% stenosis with left 60-79% stenosis. CXR noted infiltrates and patient was febrile therefore the pt was started on antibiotics. Creatinine at admission was 2.6, but this improved to 1.5.  CK of 7000 was noted at the time of admission. Patient's mentation slowly improved.  Family requested transfer to [facility], and this was accomplished on 7/5.   Subjective: 7/27  Afebrile last 24 hours, elevated temp at 37.9C. per RN note 3 large BMs overnight after constipation interventions. Per RN patient able to speak his name with PMV in place, sister states was able to say hello to his mother and her with PMV in place.       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[place]) Active Problems:   ARF (acute renal failure) (HCC)   Essential hypertension   Rhabdomyolysis   Acute ischemic stroke (HCC)   Pressure injury of skin   Dysphagia, post-stroke   Benign essential HTN   Tobacco abuse   Tachypnea   Hyperglycemia   Hypernatremia   Leukocytosis   Acute blood loss anemia   Chronic kidney disease   Septic shock (HCC)   Acute respiratory failure (HCC)   Aspiration into airway   Severe sepsis / Left lung Aspiration PNA with possible mucous plugging/Recurrent aspiration  -S/p trach 7/20 - appears to have persisting problem w/ aspiration - cont all possible interventions to prevent as able  -Patient with possible developing pneumonitis vs new aspiration  pneumonia  Multiple Acute strokes B posterior cerebral hemispheres -  -Minimally responsive -Care per Neurology/Stroke team  - 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 Currently patient unable to participate even for evaluation. -Per speech able to tolerate PMV  - to consider loop recorder at outpt  - TEE w/o evidence of embolic source   FUO - - afebrile last 24 hours, but continued elevation of temp, leukocytosis with left shift  - more than likely this is indicative of another episode of aspiration w/ pneumonitis and possible developing PNA vs funguria -7/24 PCXR: No significant change -Will obtain blood culture, urine culture  Funguria -Patient continues to have elevated temperature and leukocytosis, normally do not treat yeast in the urine but given patient's fragile state and multiple medical problems will treat. -Diflucan 200 mg IV daily  Atrial septal aneurysm -TEE ruled out embolic source  Sinus tachycardia -Amlodipine 10 mg daily -Hydralazine PRN -Metoprolol 100 mg BID  Essential HTN -See sinus tachycardia    Dysphagia -Secondary to ileus -Pt now has PEG tube in place, continue NPO, placed to low wall suction -Continue Reglan    Ileus  -7/26 One bottle magnesium citrate -Lactulose BID -Reglan QID -Fleet Enema daily -If above measures fail will consult GI/surgery for intervention doubt patient is candidate given his multiple medical conditions. Explained this to sister -7/26; shows continued ileus,  place PEG tube to low wall suction   HLD -Pravachol 20 mg daily   Acute renal failure Creatinine peaked at 2.6 - renal function has normalized Lab Results  Component Value Date   CREATININE 0.95 11/10/2016   CREATININE 1.00 11/09/2016   CREATININE 0.97 11/08/2016    History of  alcoholism  Mild transaminits  -resolved - etiology unclear - viral hep panel negative - likely shock liver   Tobacco abuse  Severe malnutrition in context of acute illness -PEG tube placed   DVT prophylaxis: SCD Code Status: Full Family Communication: Spoke at great length with sister explained poor prognosis Disposition Plan: SNF when stable   Consultants:  Neurology PC CM   Procedures/Significant Events:  7/1 admit to outside hospital 7/5 transfer to [facility] 7/13 PEG tube inserted  7/14 to ICU after vomit w/ aspiration - intubated  7/16 LE venous duplex - no DVT 7/09 TEE - no embolic source - negative bubble study - normal EF 7/18 extubated > recurrent aspiration > re-intubated  7/18 bronchoscopy for culture 7/20 tracheostomy  7/23 TRH resumed care  7/27 blood pending 7/27 urine pending    VENTILATOR SETTINGS: None   Cultures 7/6 MRSA by PCR negative 7/15 endotracheal normal flora 7/15 blood negative final 7/18 BAL normal flora 7/23 urine positive yeast 7/23 blood NGTD     Antimicrobials:Anti-infectives    Start     Stop   11/10/16 1700  fluconazole (DIFLUCAN) IVPB 200 mg         11/10/16 1608  fluconazole (DIFLUCAN) IVPB 200 mg  Status:  Discontinued     11/10/16 1610   10/29/16 1200  vancomycin (VANCOCIN) IVPB 1000 mg/200 mL premix  Status:  Discontinued     10/31/16 1140   10/29/16 0600  piperacillin-tazobactam (ZOSYN) IVPB 3.375 g     11/05/16 0138   10/29/16 0100  vancomycin (VANCOCIN) 1,500 mg in sodium chloride 0.9 % 500 mL IVPB     10/29/16 0539   10/29/16 0100  piperacillin-tazobactam (ZOSYN) IVPB 3.375 g     10/29/16 0410   10/29/16 0045  piperacillin-tazobactam (ZOSYN) IVPB 3.375 g  Status:  Discontinued     10/29/16 0036   10/29/16 0045  vancomycin (VANCOCIN) IVPB 1000 mg/200 mL premix  Status:  Discontinued     10/29/16 0037   10/26/16 1000  amoxicillin-clavulanate (AUGMENTIN) 600-42.9 MG/5ML suspension 875 mg  Status:   Discontinued     10/29/16 0034   10/25/16 1000  ceFEPIme (MAXIPIME) 1 g in dextrose 5 % 50 mL IVPB     10/25/16 2345   10/20/16 2200  ceFEPIme (MAXIPIME) 1 g in dextrose 5 % 50 mL IVPB  Status:  Discontinued     10/25/16 0922   10/19/16 2300  ceFEPIme (MAXIPIME) 1 g in dextrose 5 % 50 mL IVPB  Status:  Discontinued     10/20/16 1047   10/19/16 2200  vancomycin (VANCOCIN) IVPB 750 mg/150 ml premix  Status:  Discontinued     10/20/16 1131   10/19/16 2115  vancomycin (VANCOCIN) 1,500 mg in sodium chloride 0.9 % 500 mL IVPB  Status:  Discontinued     10/19/16 2117   10/19/16 2115  ceFEPIme (MAXIPIME) 2 g in dextrose 5 % 50 mL IVPB  Status:  Discontinued     10/19/16 2120       Devices None   LINES / TUBES:  None    Continuous Infusions:    Objective: Vitals:   11/10/16 0016 11/10/16 0329 11/10/16  0452 11/10/16 0700  BP: 123/63 (!) 141/64 109/83 (!) 155/63  Pulse:  79  90  Resp:  (!) 22  (!) 29  Temp:  98.8 F (37.1 C)  98.8 F (37.1 C)  TempSrc:  Oral  Oral  SpO2:  93%    Weight:  156 lb 8.4 oz (71 kg)    Height:        Intake/Output Summary (Last 24 hours) at 11/10/16 0729 Last data filed at 11/10/16 4696  Gross per 24 hour  Intake                0 ml  Output             1875 ml  Net            -1875 ml   Filed Weights   11/08/16 0500 11/09/16 0328 11/10/16 0329  Weight: 163 lb 12.8 oz (74.3 kg) 163 lb 2.3 oz (74 kg) 156 lb 8.4 oz (71 kg)    Examination:  General: A/O 0, opens eyes When you call his name, responds to commands, positive acute respiratory distress Eyes: negative scleral hemorrhage, negative anisocoria, negative icterus ENT: Negative Runny nose, negative gingival bleeding,, poor dentation Neck:  Negative scars, masses, torticollis, lymphadenopathy, JVD #6 trach in place, negative sign of infection.  Lungs: decreased air movement by basilar, negative wheezes, negative crackles  Cardiovascular: Regular rhythm and rate without murmur gallop or  rub normal S1 and S2 Abdomen: negative abdominal pain, nondistended, positive soft, bowel sounds, no rebound, no ascites, no appreciable mass, PEG tube in place negative sign of infection Extremities: No significant cyanosis, clubbing, or edema bilateral lower extremities Skin: Negative rashes, lesions, ulcers Psychiatric:  Unable to assess secondary to patient's altered mental status  Central nervous system:  Spontaneously opens eyes,lifted right and left arm, on command with old right and left toes.  Waves goodbye when  I left the room      Data Reviewed: Care during the described time interval was provided by me .  I have reviewed this patient's available data, including medical history, events of note, physical examination, and all test results as part of my evaluation. I have personally reviewed and interpreted all radiology studies.  CBC:  Recent Labs Lab 11/05/16 0402 11/06/16 0345 11/07/16 0802 11/08/16 0521 11/09/16 0302  WBC 18.1* 30.4* 27.7* 27.6* 22.1*  NEUTROABS  --   --   --  21.2*  --   HGB 8.6* 9.1* 8.5* 7.5* 8.9*  HCT 28.1* 27.5* 26.6* 23.2* 28.0*  MCV 91.5 86.5 87.8 86.9 87.8  PLT 400 687* 600* 710* 295*   Basic Metabolic Panel:  Recent Labs Lab 11/04/16 0251  11/06/16 0345 11/07/16 0802 11/08/16 0521 11/09/16 0302 11/10/16 0249  NA 142  < > 137 139 139 140 139  K 4.2  < > 5.4* 3.8 4.0 4.5 4.1  CL 112*  < > 103 107 105 104 105  CO2 22  < > _0 GLUCOSE 116*  < > 95 105* 89 80 88  BUN 17  < > _1 CREATININE 1.31*  < > 1.05 0.97 0.97 1.00 0.95  CALCIUM 9.1  < > 9.4 9.4 9.8 9.8 9.5  MG 1.9  --   --  1.7 1.8 2.0 1.9  PHOS 3.2  --   --   --   --   --   --   < > = values in this  interval not displayed. GFR: Estimated Creatinine Clearance: 76.8 mL/min (by C-G formula based on SCr of 0.95 mg/dL). Liver Function Tests: No results for input(s): AST, ALT, ALKPHOS, BILITOT, PROT, ALBUMIN in the last 168 hours. No results for input(s):  LIPASE, AMYLASE in the last 168 hours. No results for input(s): AMMONIA in the last 168 hours. Coagulation Profile:  Recent Labs Lab 11/03/16 0846  INR 1.21   Cardiac Enzymes: No results for input(s): CKTOTAL, CKMB, CKMBINDEX, TROPONINI in the last 168 hours. BNP (last 3 results) No results for input(s): PROBNP in the last 8760 hours. HbA1C: No results for input(s): HGBA1C in the last 72 hours. CBG:  Recent Labs Lab 11/09/16 1640 11/09/16 1954 11/09/16 2326 11/10/16 0325 11/10/16 0723  GLUCAP 98 94 90 78 78   Lipid Profile: No results for input(s): CHOL, HDL, LDLCALC, TRIG, CHOLHDL, LDLDIRECT in the last 72 hours. Thyroid Function Tests: No results for input(s): TSH, T4TOTAL, FREET4, T3FREE, THYROIDAB in the last 72 hours. Anemia Panel: No results for input(s): VITAMINB12, FOLATE, FERRITIN, TIBC, IRON, RETICCTPCT in the last 72 hours. Urine analysis:    Component Value Date/Time   COLORURINE YELLOW 11/06/2016 1532   APPEARANCEUR HAZY (A) 11/06/2016 1532   LABSPEC 1.011 11/06/2016 1532   PHURINE 5.0 11/06/2016 1532   GLUCOSEU NEGATIVE 11/06/2016 1532   HGBUR MODERATE (A) 11/06/2016 1532   BILIRUBINUR NEGATIVE 11/06/2016 1532   KETONESUR NEGATIVE 11/06/2016 1532   PROTEINUR 30 (A) 11/06/2016 1532   NITRITE NEGATIVE 11/06/2016 1532   LEUKOCYTESUR LARGE (A) 11/06/2016 1532   Sepsis Labs: _0 (procalcitonin:4,lacticidven:4)  ) Recent Results (from the past 240 hour(s))  Culture, bal-quantitative     Status: Abnormal   Collection Time: 11/01/16  4:15 PM  Result Value Ref Range Status   Specimen Description BRONCHIAL ALVEOLAR LAVAGE  Final   Special Requests NONE  Final   Gram Stain   Final    ABUNDANT WBC PRESENT, PREDOMINANTLY PMN RARE GRAM POSITIVE RODS    Culture (A)  Final    60,000 COLONIES/mL Consistent with normal respiratory flora.   Report Status 11/04/2016 FINAL  Final  Culture, Urine     Status: Abnormal   Collection Time: 11/06/16  3:32 PM   Result Value Ref Range Status   Specimen Description URINE, RANDOM  Final   Special Requests NONE  Final   Culture >=100,000 COLONIES/mL YEAST (A)  Final   Report Status 11/07/2016 FINAL  Final  Culture, blood (routine x 2)     Status: None (Preliminary result)   Collection Time: 11/06/16  4:14 PM  Result Value Ref Range Status   Specimen Description BLOOD RIGHT HAND  Final   Special Requests IN PEDIATRIC BOTTLE Blood Culture adequate volume  Final   Culture NO GROWTH 3 DAYS  Final   Report Status PENDING  Incomplete  Culture, blood (routine x 2)     Status: None (Preliminary result)   Collection Time: 11/06/16  4:46 PM  Result Value Ref Range Status   Specimen Description BLOOD LEFT ARM  Final   Special Requests IN PEDIATRIC BOTTLE Blood Culture adequate volume  Final   Culture NO GROWTH 3 DAYS  Final   Report Status PENDING  Incomplete         Radiology Studies: Dg Abd Portable 1v  Result Date: 11/08/2016 CLINICAL DATA:  Vomiting, ileus EXAM: PORTABLE ABDOMEN - 1 VIEW COMPARISON:  11/06/2016 FINDINGS: Gastrostomy tube projects over the stomach. Gas throughout mildly prominent large and small bowel loops, similar to prior  study. Moderate stool burden. No visible free air organomegaly. IMPRESSION: Stable gas throughout mildly prominent large and small bowel loops. No change. Electronically Signed   By: Rolm Baptise M.D.   On: 11/08/2016 10:37        Scheduled Meds: . amLODipine  10 mg Per Tube Daily  . aspirin  325 mg Per Tube Daily  . chlorhexidine gluconate (MEDLINE KIT)  15 mL Mouth Rinse BID  . cloNIDine  0.1 mg Per Tube BID  . famotidine  20 mg Per Tube BID  . free water  100 mL Per Tube QID  . heparin subcutaneous  5,000 Units Subcutaneous Q8H  . insulin aspart  0-9 Units Subcutaneous Q4H  . lactulose  10 g Per Tube BID  . mouth rinse  15 mL Mouth Rinse QID  . metoCLOPramide (REGLAN) injection  10 mg Intravenous Q6H  . metoprolol tartrate  100 mg Per Tube BID   . multivitamin with minerals  1 tablet Per Tube Daily  . nicotine  14 mg Transdermal Daily  . pravastatin  20 mg Per Tube q1800  . sodium phosphate  1 enema Rectal Daily  . thiamine  100 mg Per Tube Daily   Continuous Infusions:    LOS: 22 days    Time spent: 40 minutes    Aimy Sweeting, Geraldo Docker, MD [facility] Pager [phone]   If 7PM-7AM, please contact night-coverage [email or web address] Password [facility] 11/10/2016, 7:29 AM

## 2016-11-10 NOTE — Progress Notes (Signed)
  Speech Language Pathology Treatment: [name] Speaking valve  Patient Details Name: TERION HEDMAN MRN: [id number] DOB: [birth year] Today's Date: 11/10/2016 Time: 4098-1191 SLP Time Calculation (min) (ACUTE ONLY): 40 min  Assessment / Plan / Recommendation Clinical Impression  Pt fully alert today, the most responsive he has been. Pt reports pain in his back and with PMSV in place audibly verbalizes "yes" to pain question and "back" for location. SLP assisted pt in secretion expectoration. He cannot mobilize secretions volitionally, but with a yankauer to trigger a cough he will expectorate thick white secretions from trach or mouth. SLP allwoed sister to briefly supervise pt for 20 minutes with PMSV while pt on the phone with sister and mother. Sister reports he audibly said "i love you" to both. When PMSV removed, no signs of air trapping or insufficient vital observed. Recommend pt continue PMSV use with full staff supervision given ongoing MD desire for cuff inflation in setting of vomiting. SLP will follow for ongoing facilitation of communication.   HPI HPI: 66 y.o. male with history of hypertension was brought to [facility] on 10/15/2016 after patient was found to be confused. As per the family who provided most of the history patient was not able to be raised and family went to check on him at home and was found on the floor confused. Patient was taken to the ER. The last contact was 2 days ago by patient's brother prior to the episode. At [facility] MRI showed multiple acute infarcts involving the both hemisphere of the cerebellum and right occipital lobe right midbrain and left thalamus the fourth ventricle was patent no hydrocephalus. 2-D echo showing EF of 55-60% normal LV size. Carotid Doppler was done which showed right ICA 40-59% stenosis with left 60-79% stenosis. Chest x-ray was showing infiltrates and patient also was febrile for which  patient was started on antibiotics. Patient's creatinine at admission was 2.6 which improved to 1.5 the time of discharge. Patient also had rhabdomyolysis with CK of 7000 at the time of admission. As per the note patient was initially agitated requiring Haldol at the other hospital. Patient's mentation slowly improved gradually and family requested transfer to [facility] after discussing with neurologist at [facility] Dr. Leonel Ramsay      SLP Plan  Continue with current plan of care       Recommendations         Patient may use Passy-Muir Speech Valve: During all therapies with supervision;Intermittently with supervision PMSV Supervision: Full         Oral Care Recommendations: Oral care QID Follow up Recommendations: Skilled Nursing facility SLP Visit Diagnosis: Aphonia (R49.1) Plan: Continue with current plan of care       GO               [facility], MA CCC-SLP [phone]  Lynann Beaver 11/10/2016, 11:23 AM

## 2016-11-10 NOTE — Progress Notes (Signed)
Per bedside RN, patient with TC at 28% since yesterday, tolerated overnight. Will start trickle feeds today, can follow commands but is not conversational. Palliative continues to follow.
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
## 2016-11-11 DIAGNOSIS — K567 Ileus, unspecified: Secondary | ICD-10-CM

## 2016-11-11 LAB — COMPREHENSIVE METABOLIC PANEL
ALT: 16 U/L — ABNORMAL LOW (ref 17–63)
AST: 19 U/L (ref 15–41)
Albumin: 1.6 g/dL — ABNORMAL LOW (ref 3.5–5.0)
Alkaline Phosphatase: 86 U/L (ref 38–126)
Anion gap: 8 (ref 5–15)
BUN: 9 mg/dL (ref 6–20)
CO2: 26 mmol/L (ref 22–32)
Calcium: 9.3 mg/dL (ref 8.9–10.3)
Chloride: 104 mmol/L (ref 101–111)
Creatinine, Ser: 0.84 mg/dL (ref 0.61–1.24)
GFR calc Af Amer: >60 mL/min (ref >60)
GFR calc non Af Amer: >60 mL/min (ref >60)
Glucose, Bld: 95 mg/dL (ref 65–99)
Potassium: 4 mmol/L (ref 3.5–5.1)
Sodium: 138 mmol/L (ref 135–145)
Total Bilirubin: 0.7 mg/dL (ref 0.3–1.2)
Total Protein: 6.6 g/dL (ref 6.5–8.1)

## 2016-11-11 LAB — GLUCOSE, CAPILLARY
Glucose-Capillary: 104 mg/dL — ABNORMAL HIGH (ref 65–99)
Glucose-Capillary: 108 mg/dL — ABNORMAL HIGH (ref 65–99)
Glucose-Capillary: 110 mg/dL — ABNORMAL HIGH (ref 65–99)
Glucose-Capillary: 117 mg/dL — ABNORMAL HIGH (ref 65–99)
Glucose-Capillary: 118 mg/dL — ABNORMAL HIGH (ref 65–99)
Glucose-Capillary: 86 mg/dL (ref 65–99)

## 2016-11-11 LAB — CULTURE, BLOOD (ROUTINE X 2)
Culture: NO GROWTH
Culture: NO GROWTH
Special Requests: ADEQUATE
Special Requests: ADEQUATE

## 2016-11-11 LAB — MAGNESIUM: Magnesium: 1.8 mg/dL (ref 1.7–2.4)

## 2016-11-11 LAB — PHOSPHORUS: Phosphorus: 3.6 mg/dL (ref 2.5–4.6)

## 2016-11-11 MED ORDER — VITAL AF 1.2 CAL PO LIQD
1000.0000 mL | ORAL | Status: DC
  Administered 2016-11-11: 1000 mL
  Filled 2016-11-11: qty 1000

## 2016-11-11 MED ORDER — VITAL AF 1.2 CAL PO LIQD: 1000.0000 mL | ORAL | Status: DC

## 2016-11-11 NOTE — Progress Notes (Signed)
PROGRESS NOTE    Scott Rivera  [name] DOB: 11/24/1950 DOA: 10/19/2016 PCP: Patient, No Pcp Per   Brief Narrative:  66 y.o.BM PMHx HTN, CVA,  Presented to [facility] on 10/15/2016 after family found him on the floor confused. MRI showed multiple acute infarcts involving both hemispheres of the cerebellum, the right occipital lobe. right midbrain, and left thalamus. The fourth ventricle was patent/no hydrocephalus. TTE noted EF 55-60% w/ normal LV size. Carotid doppler showed right ICA 40-59% stenosis with left 60-79% stenosis. CXR noted infiltrates and patient was febrile therefore the pt was started on antibiotics. Creatinine at admission was 2.6, but this improved to 1.5. CK of 7000 was noted at the time of admission. Patient's mentation slowly improved. Family requested transfer to [facility], and this was accomplished on 7/5.     Subjective: 7/28 patient awake alert answers questions by nodding yes and no, motioning that he would like food/water. Afebrile overnight. Patient sitting up in bed,       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[place]) Active Problems:   ARF (acute renal failure) (H[facility])   Essential hypertension   Rhabdomyolysis   Acute ischemic stroke ([facility])   Pressure injury of skin   Dysphagia, post-stroke   Benign essential HTN   Tobacco abuse   Tachypnea   Hyperglycemia   Hypernatremia   Leukocytosis   Acute blood loss anemia   Chronic kidney disease   Septic shock ([facility])   Acute respiratory failure (H[facility])   Aspiration into airway   Aspiration pneumonia (H[facility])   Goals of care, counseling/discussion   Palliative care by specialist   Severe sepsis / Left lung Aspiration PNA with possible mucous plugging/Recurrent aspiration  -S/p trach 7/20 - appears to have persisting problem w/ aspiration - cont all possible interventions to prevent as able  -Patient with possible developing pneumonitis vs new  aspiration pneumonia  Multiple Acute strokes B posterior cerebral hemispheres -  -Minimally responsive -Care per Neurology/Stroke team  - 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 Currently patient unable to participate even for evaluation. -Per speech able to tolerate PMV  - to consider loop recorder at outpt  - TEE w/o evidence of embolic source   FUO - - afebrile last 24 hours, but continued elevation of temp, leukocytosis with left shift  - more than likely this is indicative of another episode of aspiration w/ pneumonitis and possible developing PNA vs funguria -7/24 PCXR: No significant change -Blood culture nondiagnostic to date  Funguria -Patient continues to have elevated temperature and leukocytosis, normally do not treat yeast in the urine but given patient's fragile state and multiple medical problems will treat. -Diflucan 200 mg IV daily  Atrial septal aneurysm -TEE ruled out embolic source  Sinus tachycardia -Amlodipine 10 mg daily -Hydralazine PRN -Metoprolol 100 mg BID  Essential HTN -See sinus tachycardia    Dysphagia -Secondary to ileus -Pt now has PEG tube in place, continue NPO, placed to low wall suction -Continue Reglan   Ileus  -After aggressive measures as to the patient had multiple bowel movements, now with soft abdomen and positive bowel sounds -7/28 patient with bowel sounds WNL, soft to exam will begin trickle feeds  HLD -Pravachol 20 mg daily   Acute renal failure -Creatinine peaked at 2.6  -  renal function has normalized Lab Results  Component Value Date   CREATININE 0.84 11/11/2016   CREATININE 0.95 11/10/2016   CREATININE 1.00 11/09/2016   History of alcoholism  Mild transaminits  -resolved  Tobacco abuse  Severe malnutrition in context of acute  illness -PEG tube placed. However feeds on hold secondary to ileus   DVT prophylaxis: SCD Code Status: Full Family Communication:  none Disposition Plan: SNF when stable   Consultants:  Neurology PC CM   Procedures/Significant Events:  7/1 admit to outside hospital 7/5 transfer to [facility] 7/13 PEG tube inserted  7/14 to ICU after vomit w/ aspiration - intubated  7/16 LE venous duplex - no DVT 2/50 TEE - no embolic source - negative bubble study - normal EF 7/18 extubated >recurrent aspiration >re-intubated  7/18 bronchoscopy for culture 7/20 tracheostomy  7/23 TRH resumed care  7/27 blood pending 7/27 urine pending    VENTILATOR SETTINGS: None   Cultures 7/6 MRSA by PCR negative 7/15 endotracheal normal flora 7/15 blood negative final 7/18 BAL normal flora 7/23 urine positive yeast 7/23 blood NGTD 7/26 blood NGTD 7/27 blood NGTD    Antimicrobials: Anti-infectives    Start     Stop   11/10/16 1700  fluconazole (DIFLUCAN) IVPB 200 mg         11/10/16 1608  fluconazole (DIFLUCAN) IVPB 200 mg  Status:  Discontinued     11/10/16 1610   10/29/16 1200  vancomycin (VANCOCIN) IVPB 1000 mg/200 mL premix  Status:  Discontinued     10/31/16 1140   10/29/16 0600  piperacillin-tazobactam (ZOSYN) IVPB 3.375 g     11/05/16 0138   10/29/16 0100  vancomycin (VANCOCIN) 1,500 mg in sodium chloride 0.9 % 500 mL IVPB     10/29/16 0539   10/29/16 0100  piperacillin-tazobactam (ZOSYN) IVPB 3.375 g     10/29/16 0410   10/29/16 0045  piperacillin-tazobactam (ZOSYN) IVPB 3.375 g  Status:  Discontinued     10/29/16 0036   10/29/16 0045  vancomycin (VANCOCIN) IVPB 1000 mg/200 mL premix  Status:  Discontinued     10/29/16 0037   10/26/16 1000  amoxicillin-clavulanate (AUGMENTIN) 600-42.9 MG/5ML suspension 875 mg  Status:  Discontinued     10/29/16 0034   10/25/16 1000  ceFEPIme (MAXIPIME) 1 g in dextrose 5 % 50 mL IVPB     10/25/16 2345   10/20/16 2200  ceFEPIme  (MAXIPIME) 1 g in dextrose 5 % 50 mL IVPB  Status:  Discontinued     10/25/16 0922   10/19/16 2300  ceFEPIme (MAXIPIME) 1 g in dextrose 5 % 50 mL IVPB  Status:  Discontinued     10/20/16 1047   10/19/16 2200  vancomycin (VANCOCIN) IVPB 750 mg/150 ml premix  Status:  Discontinued     10/20/16 1131   10/19/16 2115  vancomycin (VANCOCIN) 1,500 mg in sodium chloride 0.9 % 500 mL IVPB  Status:  Discontinued     10/19/16 2117   10/19/16 2115  ceFEPIme (MAXIPIME) 2 g in dextrose 5 % 50 mL IVPB  Status:  Discontinued     10/19/16 2120       Devices    LINES / TUBES:  7/13 PEG tube >>     Continuous Infusions: . dextrose 5 % and 0.45% NaCl 50 mL/hr at 11/11/16 1204  . fluconazole (DIFLUCAN) IV Stopped (11/10/16 2010)     Objective: Vitals:   11/11/16 0833 11/11/16 0950 11/11/16 1223 11/11/16 1314  BP: (!) 150/66 (!) 172/86  Marland Kitchen)  143/78  Pulse: 88 92 89 85  Resp: 20  18 (!) 26  Temp:    97.7 F (36.5 C)  TempSrc:    Axillary  SpO2: 96%  100% 97%  Weight:      Height:        Intake/Output Summary (Last 24 hours) at 11/11/16 1427 Last data filed at 11/11/16 1314  Gross per 24 hour  Intake          1243.33 ml  Output             1675 ml  Net          -431.67 ml   Filed Weights   11/09/16 0328 11/10/16 0329 11/11/16 0337  Weight: 163 lb 2.3 oz (74 kg) 156 lb 8.4 oz (71 kg) 156 lb 12 oz (71.1 kg)    Examination:  General: A/O 0, opens eyes When you call his name, responds to all commands, positive acute respiratory distress Eyes: negative scleral hemorrhage, negative anisocoria, negative icterus ENT: Negative Runny nose, negative gingival bleeding,, poor dentation Neck:  Negative scars, masses, torticollis, lymphadenopathy, JVD #6 trach in place, negative sign of infection.  Lungs:  clear to auscultation bilateral, negative wheezes, negative crackles  Cardiovascular: Regular rhythm and rate without murmur gallop or rub normal S1 and S2 Abdomen: negative abdominal pain,  nondistended, positive soft, bowel sounds, no rebound, no ascites, no appreciable mass, PEG tube in place negative sign of infection Extremities: No significant cyanosis, clubbing, or edema bilateral lower extremities Skin: Negative rashes, lesions, ulcers Psychiatric:  Unable to assess secondary to patient's altered mental status  Central nervous system:  Spontaneously opens eyes, moved all extremities to command, sensation intact,     Data Reviewed: Care during the described time interval was provided by me .  I have reviewed this patient's available data, including medical history, events of note, physical examination, and all test results as part of my evaluation. I have personally reviewed and interpreted all radiology studies.  CBC:  Recent Labs Lab 11/06/16 0345 11/07/16 0802 11/08/16 0521 11/09/16 0302 11/10/16 0751  WBC 30.4* 27.7* 27.6* 22.1* 18.4*  NEUTROABS  --   --  21.2*  --  13.4*  HGB 9.1* 8.5* 7.5* 8.9* 7.7*  HCT 27.5* 26.6* 23.2* 28.0* 24.2*  MCV 86.5 87.8 86.9 87.8 88.6  PLT 687* 600* 710* 808* 161*   Basic Metabolic Panel:  Recent Labs Lab 11/07/16 0802 11/08/16 0521 11/09/16 0302 11/10/16 0249 11/11/16 0331  NA 139 139 140 139 138  K 3.8 4.0 4.5 4.1 4.0  CL 107 105 104 105 104  CO2 _0 GLUCOSE 105* 89 80 88 95  BUN _1 CREATININE 0.97 0.97 1.00 0.95 0.84  CALCIUM 9.4 9.8 9.8 9.5 9.3  MG 1.7 1.8 2.0 1.9 1.8  PHOS  --   --   --   --  3.6   GFR: Estimated Creatinine Clearance: 87 mL/min (by C-G formula based on SCr of 0.84 mg/dL). Liver Function Tests:  Recent Labs Lab 11/11/16 0331  AST 19  ALT 16*  ALKPHOS 86  BILITOT 0.7  PROT 6.6  ALBUMIN 1.6*   No results for input(s): LIPASE, AMYLASE in the last 168 hours. No results for input(s): AMMONIA in the last 168 hours. Coagulation Profile: No results for input(s): INR, PROTIME in the last 168 hours. Cardiac Enzymes: No results for input(s): CKTOTAL, CKMB,  CKMBINDEX, TROPONINI in the last 168 hours. BNP (  last 3 results) No results for input(s): PROBNP in the last 8760 hours. HbA1C: No results for input(s): HGBA1C in the last 72 hours. CBG:  Recent Labs Lab 11/10/16 1931 11/10/16 2303 11/11/16 0340 11/11/16 0821 11/11/16 1313  GLUCAP 86 98 86 104* 117*   Lipid Profile: No results for input(s): CHOL, HDL, LDLCALC, TRIG, CHOLHDL, LDLDIRECT in the last 72 hours. Thyroid Function Tests: No results for input(s): TSH, T4TOTAL, FREET4, T3FREE, THYROIDAB in the last 72 hours. Anemia Panel: No results for input(s): VITAMINB12, FOLATE, FERRITIN, TIBC, IRON, RETICCTPCT in the last 72 hours. Urine analysis:    Component Value Date/Time   COLORURINE YELLOW 11/06/2016 1532   APPEARANCEUR HAZY (A) 11/06/2016 1532   LABSPEC 1.011 11/06/2016 1532   PHURINE 5.0 11/06/2016 1532   GLUCOSEU NEGATIVE 11/06/2016 1532   HGBUR MODERATE (A) 11/06/2016 1532   BILIRUBINUR NEGATIVE 11/06/2016 1532   KETONESUR NEGATIVE 11/06/2016 1532   PROTEINUR 30 (A) 11/06/2016 1532   NITRITE NEGATIVE 11/06/2016 1532   LEUKOCYTESUR LARGE (A) 11/06/2016 1532   Sepsis Labs: _0 (procalcitonin:4,lacticidven:4)  ) Recent Results (from the past 240 hour(s))  Culture, bal-quantitative     Status: Abnormal   Collection Time: 11/01/16  4:15 PM  Result Value Ref Range Status   Specimen Description BRONCHIAL ALVEOLAR LAVAGE  Final   Special Requests NONE  Final   Gram Stain   Final    ABUNDANT WBC PRESENT, PREDOMINANTLY PMN RARE GRAM POSITIVE RODS    Culture (A)  Final    60,000 COLONIES/mL Consistent with normal respiratory flora.   Report Status 11/04/2016 FINAL  Final  Culture, Urine     Status: Abnormal   Collection Time: 11/06/16  3:32 PM  Result Value Ref Range Status   Specimen Description URINE, RANDOM  Final   Special Requests NONE  Final   Culture >=100,000 COLONIES/mL YEAST (A)  Final   Report Status 11/07/2016 FINAL  Final  Culture, blood  (routine x 2)     Status: None   Collection Time: 11/06/16  4:14 PM  Result Value Ref Range Status   Specimen Description BLOOD RIGHT HAND  Final   Special Requests IN PEDIATRIC BOTTLE Blood Culture adequate volume  Final   Culture NO GROWTH 5 DAYS  Final   Report Status 11/11/2016 FINAL  Final  Culture, blood (routine x 2)     Status: None   Collection Time: 11/06/16  4:46 PM  Result Value Ref Range Status   Specimen Description BLOOD LEFT ARM  Final   Special Requests IN PEDIATRIC BOTTLE Blood Culture adequate volume  Final   Culture NO GROWTH 5 DAYS  Final   Report Status 11/11/2016 FINAL  Final  Culture, blood (routine x 2)     Status: None (Preliminary result)   Collection Time: 11/09/16 12:57 PM  Result Value Ref Range Status   Specimen Description BLOOD RIGHT ANTECUBITAL  Final   Special Requests IN PEDIATRIC BOTTLE Blood Culture adequate volume  Final   Culture NO GROWTH 2 DAYS  Final   Report Status PENDING  Incomplete  Culture, blood (routine x 2)     Status: None (Preliminary result)   Collection Time: 11/09/16 12:57 PM  Result Value Ref Range Status   Specimen Description BLOOD LEFT HAND  Final   Special Requests IN PEDIATRIC BOTTLE Blood Culture adequate volume  Final   Culture NO GROWTH 2 DAYS  Final   Report Status PENDING  Incomplete  Culture, blood (routine x 2)  Status: None (Preliminary result)   Collection Time: 11/10/16  9:51 PM  Result Value Ref Range Status   Specimen Description BLOOD BLOOD LEFT HAND  Final   Special Requests   Final    BOTTLES DRAWN AEROBIC ONLY Blood Culture adequate volume   Culture NO GROWTH < 24 HOURS  Final   Report Status PENDING  Incomplete  Culture, blood (routine x 2)     Status: None (Preliminary result)   Collection Time: 11/10/16  9:59 PM  Result Value Ref Range Status   Specimen Description BLOOD BLOOD LEFT HAND  Final   Special Requests   Final    BOTTLES DRAWN AEROBIC AND ANAEROBIC Blood Culture results may not be  optimal due to an excessive volume of blood received in culture bottles   Culture NO GROWTH < 24 HOURS  Final   Report Status PENDING  Incomplete         Radiology Studies: Dg Abd Portable 1v  Result Date: 11/10/2016 CLINICAL DATA:  Ileus. EXAM: PORTABLE ABDOMEN - 1 VIEW COMPARISON:  11/08/2016. FINDINGS: Gastrostomy tube noted projected over the stomach. Persistent mildly distended loops of small and large bowel noted suggesting adynamic ileus. Similar findings on prior exam. No free air. IMPRESSION: 1. Gastrostomy tube in stable position. 2. Persistent small large bowel mild distention consistent adynamic ileus. No interim change . Electronically Signed   By: Marcello Moores  Register   On: 11/10/2016 08:52        Scheduled Meds: . amLODipine  10 mg Per Tube Daily  . aspirin  325 mg Per Tube Daily  . chlorhexidine gluconate (MEDLINE KIT)  15 mL Mouth Rinse BID  . cloNIDine  0.1 mg Per Tube BID  . famotidine  20 mg Per Tube BID  . free water  100 mL Per Tube QID  . heparin subcutaneous  5,000 Units Subcutaneous Q8H  . insulin aspart  0-9 Units Subcutaneous Q4H  . lactulose  10 g Per Tube BID  . mouth rinse  15 mL Mouth Rinse QID  . metoCLOPramide (REGLAN) injection  10 mg Intravenous Q6H  . metoprolol tartrate  100 mg Per Tube BID  . multivitamin with minerals  1 tablet Per Tube Daily  . nicotine  14 mg Transdermal Daily  . pravastatin  20 mg Per Tube q1800  . sodium phosphate  1 enema Rectal Daily  . thiamine  100 mg Per Tube Daily   Continuous Infusions: . dextrose 5 % and 0.45% NaCl 50 mL/hr at 11/11/16 1204  . fluconazole (DIFLUCAN) IV Stopped (11/10/16 2010)     LOS: 23 days    Time spent: 40 minutes    , Geraldo Docker, MD [facility] Pager [phone]   If 7PM-7AM, please contact night-coverage [email or web address] Password [email or web address] 11/11/2016, 2:27 PM

## 2016-11-12 DIAGNOSIS — D473 Essential (hemorrhagic) thrombocythemia: Secondary | ICD-10-CM

## 2016-11-12 LAB — CBC WITH DIFFERENTIAL/PLATELET
Basophils Absolute: 0 10*3/uL (ref 0.0–0.1)
Basophils Relative: 0 %
Eosinophils Absolute: 0.2 10*3/uL (ref 0.0–0.7)
Eosinophils Relative: 1 %
HCT: 27 % — ABNORMAL LOW (ref 39.0–52.0)
Hemoglobin: 8.6 g/dL — ABNORMAL LOW (ref 13.0–17.0)
Lymphocytes Relative: 12 %
Lymphs Abs: 2.1 10*3/uL (ref 0.7–4.0)
MCH: 28.2 pg (ref 26.0–34.0)
MCHC: 31.9 g/dL (ref 30.0–36.0)
MCV: 88.5 fL (ref 78.0–100.0)
Monocytes Absolute: 2.7 10*3/uL — ABNORMAL HIGH (ref 0.1–1.0)
Monocytes Relative: 15 %
Neutro Abs: 12.8 10*3/uL — ABNORMAL HIGH (ref 1.7–7.7)
Neutrophils Relative %: 72 %
Platelets: 903 10*3/uL (ref 150–400)
RBC: 3.05 MIL/uL — ABNORMAL LOW (ref 4.22–5.81)
RDW: 15.5 % (ref 11.5–15.5)
WBC: 17.8 10*3/uL — ABNORMAL HIGH (ref 4.0–10.5)

## 2016-11-12 LAB — GLUCOSE, CAPILLARY
Glucose-Capillary: 126 mg/dL — ABNORMAL HIGH (ref 65–99)
Glucose-Capillary: 114 mg/dL — ABNORMAL HIGH (ref 65–99)
Glucose-Capillary: 107 mg/dL — ABNORMAL HIGH (ref 65–99)
Glucose-Capillary: 135 mg/dL — ABNORMAL HIGH (ref 65–99)

## 2016-11-12 LAB — BASIC METABOLIC PANEL
Anion gap: 11 (ref 5–15)
BUN: 7 mg/dL (ref 6–20)
CO2: 26 mmol/L (ref 22–32)
Calcium: 9.6 mg/dL (ref 8.9–10.3)
Chloride: 99 mmol/L — ABNORMAL LOW (ref 101–111)
Creatinine, Ser: 0.84 mg/dL (ref 0.61–1.24)
GFR calc Af Amer: >60 mL/min (ref >60)
GFR calc non Af Amer: >60 mL/min (ref >60)
Glucose, Bld: 109 mg/dL — ABNORMAL HIGH (ref 65–99)
Potassium: 3.8 mmol/L (ref 3.5–5.1)
Sodium: 136 mmol/L (ref 135–145)

## 2016-11-12 LAB — SAVE SMEAR

## 2016-11-12 LAB — MAGNESIUM: Magnesium: 1.8 mg/dL (ref 1.7–2.4)

## 2016-11-12 LAB — IRON AND TIBC
Iron: 16 ug/dL — ABNORMAL LOW (ref 45–182)
Saturation Ratios: 9 % — ABNORMAL LOW (ref 17.9–39.5)
TIBC: 169 ug/dL — ABNORMAL LOW (ref 250–450)
UIBC: 153 ug/dL

## 2016-11-12 LAB — LACTATE DEHYDROGENASE: LDH: 146 U/L (ref 98–192)

## 2016-11-12 LAB — C-REACTIVE PROTEIN: CRP: 19.2 mg/dL — ABNORMAL HIGH (ref <1.0)

## 2016-11-12 LAB — RETICULOCYTES
RBC.: 2.97 MIL/uL — ABNORMAL LOW (ref 4.22–5.81)
Retic Count, Absolute: 47.5 10*3/uL (ref 19.0–186.0)
Retic Ct Pct: 1.6 % (ref 0.4–3.1)

## 2016-11-12 LAB — VITAMIN B12: Vitamin B-12: 976 pg/mL — ABNORMAL HIGH (ref 180–914)

## 2016-11-12 LAB — FERRITIN: Ferritin: 1808 ng/mL — ABNORMAL HIGH (ref 24–336)

## 2016-11-12 LAB — FOLATE: Folate: 23.3 ng/mL (ref >5.9)

## 2016-11-12 MED ORDER — WHITE PETROLATUM GEL
Status: AC
  Administered 2016-11-12: 1
  Filled 2016-11-12: qty 1

## 2016-11-12 MED ORDER — VITAL AF 1.2 CAL PO LIQD
1000.0000 mL | ORAL | Status: DC
Start: 2016-11-12 — End: 2016-11-13
  Administered 2016-11-12: 1000 mL

## 2016-11-12 NOTE — Progress Notes (Signed)
Sutures removed and trach care done along with ties changed

## 2016-11-12 NOTE — Progress Notes (Signed)
PROGRESS NOTE    KEEFE ZAWISTOWSKI  WUJ:[id number] DOB: April 29, 1950 DOA: 10/19/2016 PCP: Patient, No Pcp Per   Brief Narrative:  66 y.o.BM PMHx HTN, CVA,  Presented to [facility] on 10/15/2016 after family found him on the floor confused. MRI showed multiple acute infarcts involving both hemispheres of the cerebellum, the right occipital lobe. right midbrain, and left thalamus. The fourth ventricle was patent/no hydrocephalus. TTE noted EF 55-60% w/ normal LV size. Carotid doppler showed right ICA 40-59% stenosis with left 60-79% stenosis. CXR noted infiltrates and patient was febrile therefore the pt was started on antibiotics. Creatinine at admission was 2.6, but this improved to 1.5. CK of 7000 was noted at the time of admission. Patient's mentation slowly improved. Family requested transfer to [facility], and this was accomplished on 7/5.     Subjective: 7/29 awake, alert, nods yes/no to questions, afebrile overnight.         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[place]) Active Problems:   ARF (acute renal failure) (HCC)   Essential hypertension   Rhabdomyolysis   Acute ischemic stroke (HCC)   Pressure injury of skin   Dysphagia, post-stroke   Benign essential HTN   Tobacco abuse   Tachypnea   Hyperglycemia   Hypernatremia   Leukocytosis   Acute blood loss anemia   Chronic kidney disease   Septic shock (HCC)   Acute respiratory failure (HCC)   Aspiration into airway   Aspiration pneumonia (HCC)   Goals of care, counseling/discussion   Palliative care by specialist   Severe sepsis / Left lung Aspiration PNA with possible mucous plugging/Recurrent aspiration  -S/p trach 7/20 - appears to have persisting problem w/ aspiration - cont all possible interventions to prevent as able  -Developing pneumonitis  Vs new aspiration pneumonia? 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 PT/OT consult placed CIR vs SNF  -Out of bed to chair BID  FUO - - afebrile last 24 hours, but continued elevation of temp, leukocytosis with left shift  - more than likely this is indicative of another episode of aspiration w/ pneumonitis and possible developing PNA vs funguria -7/24 PCXR: No significant change -Blood culture nondiagnostic to date  Funguria -Patient leukocytosis appears to be trending down, still mildly elevated temp. Would continue to treat unless another site of infection detected.. -Diflucan 200 mg IV daily  Atrial septal aneurysm -TEE ruled out embolic source  Sinus tachycardia -Amlodipine 10 mg daily -Hydralazine PRN -Metoprolol 100 mg BID  Essential HTN -See sinus tachycardia    Dysphagia -Secondary to ileus, appears to have resolved. -Overnight patient tolerated trickle feed without residuals, advancing to feeds slowly  -Continue Reglan -Discontinue lactulose, discontinue enema   Ileus  -After aggressive measures as to the patient had multiple bowel movements, now with soft abdomen and positive bowel sounds -7/29 appears to have resolved see dysphagia  HLD -Pravachol 20 mg daily   Acute renal failure -Creatinine peaked at 2.6  - renal function has normalized Lab Results  Component Value Date  CREATININE 0.84 11/12/2016   CREATININE 0.84 11/11/2016   CREATININE 0.95 11/10/2016   History of alcoholism  Mild transaminits  -resolved  Tobacco abuse  Severe malnutrition in context of acute illness -PEG tube placed. However feeds on hold secondary to  ileus  Thrombocytosis -Infection, iron deficiency anemia, reactive, hemolytic anemia??? -Anemia panel, haptoglobin, LDH pending -C-reactive protein pending -Peripheral blood smear pending   DVT prophylaxis: SCD Code Status: Full Family Communication:  none Disposition Plan: SNF when stable   Consultants:  Neurology PC CM   Procedures/Significant Events:  7/1 admit to outside hospital 7/5 transfer to [facility] 7/13 PEG tube inserted  7/14 to ICU after vomit w/ aspiration - intubated  7/16 LE venous duplex - no DVT 4/25 TEE - no embolic source - negative bubble study - normal EF 7/18 extubated >recurrent aspiration >re-intubated  7/18 bronchoscopy for culture 7/20 tracheostomy  7/23 TRH resumed care  7/27 blood pending 7/27 urine pending    VENTILATOR SETTINGS: None   Cultures 7/6 MRSA by PCR negative 7/15 endotracheal normal flora 7/15 blood negative final 7/18 BAL normal flora 7/23 urine positive yeast 7/23 blood NGTD 7/26 blood NGTD 7/27 blood NGTD    Antimicrobials: Anti-infectives    Start     Stop   11/10/16 1700  fluconazole (DIFLUCAN) IVPB 200 mg         11/10/16 1608  fluconazole (DIFLUCAN) IVPB 200 mg  Status:  Discontinued     11/10/16 1610   10/29/16 1200  vancomycin (VANCOCIN) IVPB 1000 mg/200 mL premix  Status:  Discontinued     10/31/16 1140   10/29/16 0600  piperacillin-tazobactam (ZOSYN) IVPB 3.375 g     11/05/16 0138   10/29/16 0100  vancomycin (VANCOCIN) 1,500 mg in sodium chloride 0.9 % 500 mL IVPB     10/29/16 0539   10/29/16 0100  piperacillin-tazobactam (ZOSYN) IVPB 3.375 g     10/29/16 0410   10/29/16 0045  piperacillin-tazobactam (ZOSYN) IVPB 3.375 g  Status:  Discontinued     10/29/16 0036   10/29/16 0045  vancomycin (VANCOCIN) IVPB 1000 mg/200 mL premix  Status:  Discontinued     10/29/16 0037   10/26/16 1000  amoxicillin-clavulanate (AUGMENTIN) 600-42.9 MG/5ML suspension 875 mg  Status:  Discontinued     10/29/16  0034   10/25/16 1000  ceFEPIme (MAXIPIME) 1 g in dextrose 5 % 50 mL IVPB     10/25/16 2345   10/20/16 2200  ceFEPIme (MAXIPIME) 1 g in dextrose 5 % 50 mL IVPB  Status:  Discontinued     10/25/16 0922   10/19/16 2300  ceFEPIme (MAXIPIME) 1 g in dextrose 5 % 50 mL IVPB  Status:  Discontinued     10/20/16 1047   10/19/16 2200  vancomycin (VANCOCIN) IVPB 750 mg/150 ml premix  Status:  Discontinued     10/20/16 1131   10/19/16 2115  vancomycin (VANCOCIN) 1,500 mg in sodium chloride 0.9 % 500 mL IVPB  Status:  Discontinued     10/19/16 2117   10/19/16 2115  ceFEPIme (MAXIPIME) 2 g in dextrose 5 % 50 mL IVPB  Status:  Discontinued     10/19/16 2120       Devices    LINES / TUBES:  7/13 PEG tube >>     Continuous Infusions: . dextrose 5 % and 0.45% NaCl 50 mL/hr at 11/11/16 1204  . feeding supplement (VITAL AF 1.2 CAL) 1,000 mL (11/11/16 1826)  . fluconazole (DIFLUCAN) IV Stopped (11/11/16 1926)  Objective: Vitals:   11/12/16 0434 11/12/16 0436 11/12/16 0729 11/12/16 0751  BP:  (!) 180/83 (!) 143/65 (!) 143/65  Pulse:   (!) 103 (!) 116  Resp:   (!) 24 (!) 37  Temp:      TempSrc:      SpO2:    93%  Weight: 147 lb 14.9 oz (67.1 kg)     Height:        Intake/Output Summary (Last 24 hours) at 11/12/16 0805 Last data filed at 11/12/16 0600  Gross per 24 hour  Intake          1465.67 ml  Output             1800 ml  Net          -334.33 ml   Filed Weights   11/10/16 0329 11/11/16 0337 11/12/16 0434  Weight: 156 lb 8.4 oz (71 kg) 156 lb 12 oz (71.1 kg) 147 lb 14.9 oz (67.1 kg)    Examination: Physical Exam: Vitals:   11/12/16 0434 11/12/16 0436 11/12/16 0729 11/12/16 0751  BP:  (!) 180/83 (!) 143/65 (!) 143/65  Pulse:   (!) 103 (!) 116  Resp:   (!) 24 (!) 37  Temp:      TempSrc:      SpO2:    93%  Weight: 147 lb 14.9 oz (67.1 kg)     Height:        Wt Readings from Last 3 Encounters:  11/12/16 147 lb 14.9 oz (67.1 kg)    General: A/O 0, opens eyes,  responds to all commands, positive acute respiratory distress Neck:  Negative scars, masses, torticollis, lymphadenopathy, JVD, #6 trach in place, negative sign of infection Lungs: Clear to auscultation bilaterally without wheezes or crackles Cardiovascular: Regular rate and rhythm without murmur gallop or rub normal S1 and S2 Abdomen: negative abdominal pain, nondistended, positive soft, bowel sounds, no rebound, no ascites, no appreciable mass, PEG tube in place negative sign of infection Extremities: No significant cyanosis, clubbing, or edema bilateral lower extremities Skin: Negative rashes, lesions, ulcers Psychiatric:  Unable to assess secondary to patient's altered mental status  Central nervous system:  Patient spontaneously moves all extremities opens eyes nods yes and no to questions  negative receptive aphasia.       Data Reviewed: Care during the described time interval was provided by me .  I have reviewed this patient's available data, including medical history, events of note, physical examination, and all test results as part of my evaluation. I have personally reviewed and interpreted all radiology studies.  CBC:  Recent Labs Lab 11/07/16 0802 11/08/16 0521 11/09/16 0302 11/10/16 0751 11/12/16 0232  WBC 27.7* 27.6* 22.1* 18.4* 17.8*  NEUTROABS  --  21.2*  --  13.4* 12.8*  HGB 8.5* 7.5* 8.9* 7.7* 8.6*  HCT 26.6* 23.2* 28.0* 24.2* 27.0*  MCV 87.8 86.9 87.8 88.6 88.5  PLT 600* 710* 808* 791* 540*   Basic Metabolic Panel:  Recent Labs Lab 11/08/16 0521 11/09/16 0302 11/10/16 0249 11/11/16 0331 11/12/16 0232  NA 139 140 139 138 136  K 4.0 4.5 4.1 4.0 3.8  CL 105 104 105 104 99*  CO2 _0 GLUCOSE 89 80 88 95 109*  BUN _1 CREATININE 0.97 1.00 0.95 0.84 0.84  CALCIUM 9.8 9.8 9.5 9.3 9.6  MG 1.8 2.0 1.9 1.8 1.8  PHOS  --   --   --  3.6  --  GFR: Estimated Creatinine Clearance: 82.1 mL/min (by C-G formula based on SCr of 0.84  mg/dL). Liver Function Tests:  Recent Labs Lab 11/11/16 0331  AST 19  ALT 16*  ALKPHOS 86  BILITOT 0.7  PROT 6.6  ALBUMIN 1.6*   No results for input(s): LIPASE, AMYLASE in the last 168 hours. No results for input(s): AMMONIA in the last 168 hours. Coagulation Profile: No results for input(s): INR, PROTIME in the last 168 hours. Cardiac Enzymes: No results for input(s): CKTOTAL, CKMB, CKMBINDEX, TROPONINI in the last 168 hours. BNP (last 3 results) No results for input(s): PROBNP in the last 8760 hours. HbA1C: No results for input(s): HGBA1C in the last 72 hours. CBG:  Recent Labs Lab 11/11/16 1313 11/11/16 1548 11/11/16 1936 11/11/16 2334 11/12/16 0732  GLUCAP 117* 108* 110* 118* 126*   Lipid Profile: No results for input(s): CHOL, HDL, LDLCALC, TRIG, CHOLHDL, LDLDIRECT in the last 72 hours. Thyroid Function Tests: No results for input(s): TSH, T4TOTAL, FREET4, T3FREE, THYROIDAB in the last 72 hours. Anemia Panel: No results for input(s): VITAMINB12, FOLATE, FERRITIN, TIBC, IRON, RETICCTPCT in the last 72 hours. Urine analysis:    Component Value Date/Time   COLORURINE YELLOW 11/06/2016 1532   APPEARANCEUR HAZY (A) 11/06/2016 1532   LABSPEC 1.011 11/06/2016 1532   PHURINE 5.0 11/06/2016 1532   GLUCOSEU NEGATIVE 11/06/2016 1532   HGBUR MODERATE (A) 11/06/2016 1532   BILIRUBINUR NEGATIVE 11/06/2016 [place] 11/06/2016 1532   PROTEINUR 30 (A) 11/06/2016 1532   NITRITE NEGATIVE 11/06/2016 1532   LEUKOCYTESUR LARGE (A) 11/06/2016 1532   Sepsis Labs: _0 (procalcitonin:4,lacticidven:4)  ) Recent Results (from the past 240 hour(s))  Culture, Urine     Status: Abnormal   Collection Time: 11/06/16  3:32 PM  Result Value Ref Range Status   Specimen Description URINE, RANDOM  Final   Special Requests NONE  Final   Culture >=100,000 COLONIES/mL YEAST (A)  Final   Report Status 11/07/2016 FINAL  Final  Culture, blood (routine x 2)      Status: None   Collection Time: 11/06/16  4:14 PM  Result Value Ref Range Status   Specimen Description BLOOD RIGHT HAND  Final   Special Requests IN PEDIATRIC BOTTLE Blood Culture adequate volume  Final   Culture NO GROWTH 5 DAYS  Final   Report Status 11/11/2016 FINAL  Final  Culture, blood (routine x 2)     Status: None   Collection Time: 11/06/16  4:46 PM  Result Value Ref Range Status   Specimen Description BLOOD LEFT ARM  Final   Special Requests IN PEDIATRIC BOTTLE Blood Culture adequate volume  Final   Culture NO GROWTH 5 DAYS  Final   Report Status 11/11/2016 FINAL  Final  Culture, blood (routine x 2)     Status: None (Preliminary result)   Collection Time: 11/09/16 12:57 PM  Result Value Ref Range Status   Specimen Description BLOOD RIGHT ANTECUBITAL  Final   Special Requests IN PEDIATRIC BOTTLE Blood Culture adequate volume  Final   Culture NO GROWTH 2 DAYS  Final   Report Status PENDING  Incomplete  Culture, blood (routine x 2)     Status: None (Preliminary result)   Collection Time: 11/09/16 12:57 PM  Result Value Ref Range Status   Specimen Description BLOOD LEFT HAND  Final   Special Requests IN PEDIATRIC BOTTLE Blood Culture adequate volume  Final   Culture NO GROWTH 2 DAYS  Final   Report Status PENDING  Incomplete  Culture, blood (routine x 2)     Status: None (Preliminary result)   Collection Time: 11/10/16  9:51 PM  Result Value Ref Range Status   Specimen Description BLOOD BLOOD LEFT HAND  Final   Special Requests   Final    BOTTLES DRAWN AEROBIC ONLY Blood Culture adequate volume   Culture NO GROWTH < 24 HOURS  Final   Report Status PENDING  Incomplete  Culture, blood (routine x 2)     Status: None (Preliminary result)   Collection Time: 11/10/16  9:59 PM  Result Value Ref Range Status   Specimen Description BLOOD BLOOD LEFT HAND  Final   Special Requests   Final    BOTTLES DRAWN AEROBIC AND ANAEROBIC Blood Culture results may not be optimal due to an  excessive volume of blood received in culture bottles   Culture NO GROWTH < 24 HOURS  Final   Report Status PENDING  Incomplete         Radiology Studies: Dg Abd Portable 1v  Result Date: 11/10/2016 CLINICAL DATA:  Ileus. EXAM: PORTABLE ABDOMEN - 1 VIEW COMPARISON:  11/08/2016. FINDINGS: Gastrostomy tube noted projected over the stomach. Persistent mildly distended loops of small and large bowel noted suggesting adynamic ileus. Similar findings on prior exam. No free air. IMPRESSION: 1. Gastrostomy tube in stable position. 2. Persistent small large bowel mild distention consistent adynamic ileus. No interim change . Electronically Signed   By: Marcello Moores  Register   On: 11/10/2016 08:52        Scheduled Meds: . amLODipine  10 mg Per Tube Daily  . aspirin  325 mg Per Tube Daily  . chlorhexidine gluconate (MEDLINE KIT)  15 mL Mouth Rinse BID  . cloNIDine  0.1 mg Per Tube BID  . famotidine  20 mg Per Tube BID  . free water  100 mL Per Tube QID  . heparin subcutaneous  5,000 Units Subcutaneous Q8H  . insulin aspart  0-9 Units Subcutaneous Q4H  . lactulose  10 g Per Tube BID  . mouth rinse  15 mL Mouth Rinse QID  . metoCLOPramide (REGLAN) injection  10 mg Intravenous Q6H  . metoprolol tartrate  100 mg Per Tube BID  . multivitamin with minerals  1 tablet Per Tube Daily  . nicotine  14 mg Transdermal Daily  . pravastatin  20 mg Per Tube q1800  . sodium phosphate  1 enema Rectal Daily  . thiamine  100 mg Per Tube Daily   Continuous Infusions: . dextrose 5 % and 0.45% NaCl 50 mL/hr at 11/11/16 1204  . feeding supplement (VITAL AF 1.2 CAL) 1,000 mL (11/11/16 1826)  . fluconazole (DIFLUCAN) IV Stopped (11/11/16 1926)     LOS: 24 days    Time spent: 40 minutes    Charlissa Petros, Geraldo Docker, MD [facility] Pager [phone]   If 7PM-7AM, please contact night-coverage [email or web address] Password [email or web address] 11/12/2016, 8:05 AM

## 2016-11-12 NOTE — Progress Notes (Signed)
CRITICAL VALUE ALERT  Critical Value:  Platelets 903  Date & Time Notied:  11/12/2016 0533  Provider Notified: T. Opyd MD  Orders Received/Actions taken: MD made aware, awaiting orders

## 2016-11-13 ENCOUNTER — Inpatient Hospital Stay (HOSPITAL_COMMUNITY): Payer: Medicare HMO

## 2016-11-13 LAB — GLUCOSE, CAPILLARY
Glucose-Capillary: 101 mg/dL — ABNORMAL HIGH (ref 65–99)
Glucose-Capillary: 108 mg/dL — ABNORMAL HIGH (ref 65–99)
Glucose-Capillary: 109 mg/dL — ABNORMAL HIGH (ref 65–99)
Glucose-Capillary: 113 mg/dL — ABNORMAL HIGH (ref 65–99)
Glucose-Capillary: 113 mg/dL — ABNORMAL HIGH (ref 65–99)
Glucose-Capillary: 115 mg/dL — ABNORMAL HIGH (ref 65–99)
Glucose-Capillary: 119 mg/dL — ABNORMAL HIGH (ref 65–99)

## 2016-11-13 LAB — BASIC METABOLIC PANEL
Anion gap: 8 (ref 5–15)
BUN: 8 mg/dL (ref 6–20)
CO2: 24 mmol/L (ref 22–32)
Calcium: 9.6 mg/dL (ref 8.9–10.3)
Chloride: 101 mmol/L (ref 101–111)
Creatinine, Ser: 0.97 mg/dL (ref 0.61–1.24)
GFR calc Af Amer: >60 mL/min (ref >60)
GFR calc non Af Amer: >60 mL/min (ref >60)
Glucose, Bld: 109 mg/dL — ABNORMAL HIGH (ref 65–99)
Potassium: 4.2 mmol/L (ref 3.5–5.1)
Sodium: 133 mmol/L — ABNORMAL LOW (ref 135–145)

## 2016-11-13 LAB — CBC
HCT: 26 % — ABNORMAL LOW (ref 39.0–52.0)
Hemoglobin: 8.5 g/dL — ABNORMAL LOW (ref 13.0–17.0)
MCH: 28.5 pg (ref 26.0–34.0)
MCHC: 32.7 g/dL (ref 30.0–36.0)
MCV: 87.2 fL (ref 78.0–100.0)
Platelets: 852 10*3/uL — ABNORMAL HIGH (ref 150–400)
RBC: 2.98 MIL/uL — ABNORMAL LOW (ref 4.22–5.81)
RDW: 15.2 % (ref 11.5–15.5)
WBC: 19.1 10*3/uL — ABNORMAL HIGH (ref 4.0–10.5)

## 2016-11-13 LAB — PATHOLOGIST SMEAR REVIEW

## 2016-11-13 LAB — MAGNESIUM: Magnesium: 1.7 mg/dL (ref 1.7–2.4)

## 2016-11-13 MED ORDER — METOCLOPRAMIDE HCL 5 MG/5ML PO SOLN
10.0000 mg | Freq: Three times a day (TID) | ORAL | Status: DC
  Administered 2016-11-13 – 2016-11-29 (×62): 10 mg
  Filled 2016-11-13 (×66): qty 10

## 2016-11-13 MED ORDER — VITAL AF 1.2 CAL PO LIQD
1000.0000 mL | ORAL | Status: DC
  Administered 2016-11-13 – 2016-11-15 (×2): 1000 mL
  Filled 2016-11-13 (×2): qty 1000

## 2016-11-13 MED ORDER — CLOPIDOGREL BISULFATE 75 MG PO TABS
75.0000 mg | ORAL_TABLET | Freq: Every day | ORAL | Status: DC
  Administered 2016-11-13 – 2016-11-29 (×17): 75 mg
  Filled 2016-11-13 (×17): qty 1

## 2016-11-13 NOTE — Plan of Care (Signed)
Problem: Physical Regulation: Goal: Ability to maintain clinical measurements within normal limits will improve Outcome: Progressing Patient on trach collar and maintained overnight. Suctioned 5 times over shift. Patient tolerating tube feedings at 20/hr.

## 2016-11-13 NOTE — Progress Notes (Signed)
Palliative Care  [name] remains stable and appears calm and comfortable in bed. In chart review it appears work is being done to identify an accepting SNF, which does align with his family's previously expressed goal of full scope care. Scott Rivera (the patient's sister) had been present at the bedside today per the care nurse, however I missed her on two different visits. I called her home phone, but was unable to leave a message (it went to fast busy signal twice). I will try to reach out to her again tomorrow for ongoing support.    Charlynn Court AGNP-C Palliative Care [phone]  No charge note.

## 2016-11-13 NOTE — NC FL2 (Signed)
[place] LEVEL OF CARE SCREENING TOOL     IDENTIFICATION  Patient Name: Scott Rivera Birthdate: [birth year] Sex: male Admission Date (Current Location): 10/19/2016  [facility] and [place] Number:  Herbalist and Address:  [facility] [place] [place] [address] [place], [place] [address]      Provider Number: [id number]  Attending Physician Name and Address:  Cherene Altes, MD  Relative Name and Phone Number:  Marzetta Board ([phone]) Mother     Current Level of Care: Hospital Recommended Level of Care: [place] Prior Approval Number:    Date Approved/Denied:   PASRR Number: [id number]  Discharge Plan: SNF    Current Diagnoses: Patient Active Problem List   Diagnosis Date Noted  . Aspiration pneumonia ([place])   . Goals of care, counseling/discussion   . Palliative care by specialist   . Acute respiratory failure ([place])   . Aspiration into airway   . Septic shock ([place])   . Dysphagia, post-stroke   . Benign essential HTN   . Tobacco abuse   . Tachypnea   . Hyperglycemia   . Hypernatremia   . Leukocytosis   . Acute blood loss anemia   . Chronic kidney disease   . Pressure injury of skin 10/20/2016  . Cerebellar infarct ([place]) 10/19/2016  . ARF (acute renal failure) ([place]) 10/19/2016  . Essential hypertension 10/19/2016  . Rhabdomyolysis 10/19/2016  . Acute ischemic stroke ([place]) 10/19/2016    Orientation RESPIRATION BLADDER Height & Weight      (able to follow commands and nod appropriately)  Trach Collar 28% FiO2- on 72m shiley- will be uncuffed at DC Incontinent, External catheter Weight: 151 lb 10.8 oz (68.8 kg) Height:  '5\' 11"'$  (180.3 cm)  BEHAVIORAL SYMPTOMS/MOOD NEUROLOGICAL BOWEL NUTRITION STATUS      Incontinent Feeding tube  AMBULATORY STATUS COMMUNICATION OF NEEDS Skin   Extensive Assist Verbally (with PMV ) PU Stage and Appropriate Care PU Stage 1 Dressing:  (located on head- foamd dressing  change q 5 days) PU Stage 2 Dressing:  (located on head and elbow- foam dressing changes q5)                   Personal Care Assistance Level of Assistance  Bathing, Dressing Bathing Assistance: Maximum assistance Feeding assistance: Maximum assistance Dressing Assistance: Maximum assistance     Functional Limitations Info  Speech     Speech Info: Impaired    SPECIAL CARE FACTORS FREQUENCY  PT (By licensed PT), OT (By licensed OT), Speech therapy     PT Frequency: 5/wk OT Frequency: 5/wk     Speech Therapy Frequency: 2/wk      Contractures      Additional Factors Info  Code Status, Allergies, Insulin Sliding Scale, Suctioning Needs Code Status Info: FULL Allergies Info: NKA   Insulin Sliding Scale Info: 6/day   Suctioning Needs: q4 hours   Current Medications (11/13/2016):  This is the current hospital active medication list Current Facility-Administered Medications  Medication Dose Route Frequency Provider Last Rate Last Dose  . acetaminophen (TYLENOL) solution 650 mg  650 mg Per Tube Q4H PRN KRise Patience MD   650 mg at 11/13/16 06378  Or  . acetaminophen (TYLENOL) suppository 650 mg  650 mg Rectal Q4H PRN KRise Patience MD   650 mg at 11/06/16 0030  . amLODipine (NORVASC) tablet 10 mg  10 mg Per Tube Daily MCherene Altes MD   10  mg at 11/13/16 0944  . aspirin tablet 325 mg  325 mg Per Tube Daily Javier Glazier, MD   325 mg at 11/13/16 0944  . chlorhexidine gluconate (MEDLINE KIT) (PERIDEX) 0.12 % solution 15 mL  15 mL Mouth Rinse BID Hammonds, Sharyn Blitz, MD   15 mL at 11/13/16 0809  . cloNIDine (CATAPRES) tablet 0.1 mg  0.1 mg Per Tube BID Cherene Altes, MD   0.1 mg at 11/13/16 0944  . dextrose 5 %-0.45 % sodium chloride infusion   Intravenous Continuous Allie Bossier, MD 50 mL/hr at 11/13/16 0636    . famotidine (PEPCID) 40 MG/5ML suspension 20 mg  20 mg Per Tube BID Rigoberto Noel, MD   20 mg at 11/13/16 0944  . feeding  supplement (VITAL AF 1.2 CAL) liquid 1,000 mL  1,000 mL Per Tube Continuous Allie Bossier, MD 20 mL/hr at 11/12/16 1849 1,000 mL at 11/12/16 1849  . fluconazole (DIFLUCAN) IVPB 200 mg  200 mg Intravenous Q24H Allie Bossier, MD   Stopped at 11/12/16 1801  . free water 100 mL  100 mL Per Tube QID Joette Catching T, MD   100 mL at 11/13/16 0945  . heparin injection 5,000 Units  5,000 Units Subcutaneous Q8H Hammonds, Sharyn Blitz, MD   5,000 Units at 11/13/16 0607  . hydrALAZINE (APRESOLINE) injection 10 mg  10 mg Intravenous Q4H PRN Rigoberto Noel, MD   10 mg at 11/12/16 0436  . HYDROcodone-acetaminophen (HYCET) 7.5-325 mg/15 ml solution 10 mL  10 mL Per Tube Q4H PRN Tanda Rockers, MD   10 mL at 11/10/16 1100  . insulin aspart (novoLOG) injection 0-9 Units  0-9 Units Subcutaneous Q4H Erick Colace, NP   1 Units at 11/12/16 2017  . LORazepam (ATIVAN) injection 1 mg  1 mg Intravenous Q4H PRN Cherene Altes, MD      . MEDLINE mouth rinse  15 mL Mouth Rinse QID Hammonds, Sharyn Blitz, MD   15 mL at 11/13/16 0607  . metoCLOPramide (REGLAN) injection 10 mg  10 mg Intravenous Q6H Cherene Altes, MD   10 mg at 11/13/16 0607  . metoprolol tartrate (LOPRESSOR) tablet 100 mg  100 mg Per Tube BID Cherene Altes, MD   100 mg at 11/13/16 0944  . morphine 4 MG/ML injection 2-4 mg  2-4 mg Intravenous Q2H PRN Cherene Altes, MD      . multivitamin with minerals tablet 1 tablet  1 tablet Per Tube Daily Cherene Altes, MD   1 tablet at 11/13/16 0944  . nicotine (NICODERM CQ - dosed in mg/24 hours) patch 14 mg  14 mg Transdermal Daily Rise Patience, MD   14 mg at 11/13/16 0944  . ondansetron (ZOFRAN) injection 4 mg  4 mg Intravenous Q8H PRN Tanda Rockers, MD   4 mg at 11/08/16 0046  . pravastatin (PRAVACHOL) tablet 20 mg  20 mg Per Tube q1800 Cherene Altes, MD   20 mg at 11/12/16 1732  . thiamine (VITAMIN B-1) tablet 100 mg  100 mg Per Tube Daily Cherene Altes, MD   100 mg at  11/13/16 1540     Discharge Medications: Please see discharge summary for a list of discharge medications.  Relevant Imaging Results:  Relevant Lab Results:   Additional Information SS#: [id number]  Jorge Ny, LCSW

## 2016-11-13 NOTE — Progress Notes (Signed)
  Speech Language Pathology Treatment: [name] Speaking valve  Patient Details Name: Scott Rivera MRN: [id number] DOB: 04-13-1951 Today's Date: 11/13/2016 Time: 1340-1350 SLP Time Calculation (min) (ACUTE ONLY): 10 min  Assessment / Plan / Recommendation Clinical Impression  Pt alert, but not as attentive today, possibly because his sister was not present. Pt easily tolerated cuff deflation eventually expectorating secretions as needed, but not on command. Also not response to cues to clear upper airway of audible secretions. Pt did not communicate or verbalize beyond a sustained /a/ despite max cues to repeat name, words, counting. Will continue efforts.   HPI HPI: 65 y.o. male with history of hypertension was brought to [facility] on 10/15/2016 after patient was found to be confused. As per the family who provided most of the history patient was not able to be raised and family went to check on him at home and was found on the floor confused. Patient was taken to the ER. The last contact was 2 days ago by patient's brother prior to the episode. At [facility] MRI showed multiple acute infarcts involving the both hemisphere of the cerebellum and right occipital lobe right midbrain and left thalamus the fourth ventricle was patent no hydrocephalus. 2-D echo showing EF of 55-60% normal LV size. Carotid Doppler was done which showed right ICA 40-59% stenosis with left 60-79% stenosis. Chest x-ray was showing infiltrates and patient also was febrile for which patient was started on antibiotics. Patient's creatinine at admission was 2.6 which improved to 1.5 the time of discharge. Patient also had rhabdomyolysis with CK of 7000 at the time of admission. As per the note patient was initially agitated requiring Haldol at the other hospital. Patient's mentation slowly improved gradually and family requested transfer to [facility] after discussing with  neurologist at [facility] Dr. Leonel Ramsay      SLP Plan  Continue with current plan of care       Recommendations         Patient may use Passy-Muir Speech Valve: During all therapies with supervision;Intermittently with supervision PMSV Supervision: Full         Oral Care Recommendations: Oral care QID Follow up Recommendations: Skilled Nursing facility Plan: Continue with current plan of care       Powers Elsia Lasota, MA CCC-SLP [phone]  Scott Rivera 11/13/2016, 2:46 PM

## 2016-11-13 NOTE — Progress Notes (Addendum)
[place] TEAM 1 - Stepdown/ICU TEAM  Scott Rivera  SEG:[id number] DOB: 1950-04-28 DOA: 10/19/2016 PCP: Patient, No Pcp Per    Brief Narrative:  66 y.o. male with history of HTN who presented to [facility] on 10/15/2016 after family found him on the floor confused. MRI showed multiple acute infarcts involving both hemispheres of the cerebellum, the right occipital lobe. right midbrain, and left thalamus.  The fourth ventricle was patent/no hydrocephalus. TTE noted EF 55-60% w/ normal LV size. Carotid doppler showed right ICA 40-59% stenosis with left 60-79% stenosis. CXR noted infiltrates and patient was febrile therefore the pt was started on antibiotics. Creatinine at admission was 2.6, but this improved to 1.5.  CK of 7000 was noted at the time of admission. Patient's mentation slowly improved.  Family requested transfer to [facility], and this was accomplished on 7/5.  Significant Events: 7/1 admit to outside hospital 7/5 transfer to [facility] 7/13 PEG tube inserted  7/14 to ICU after vomit w/ aspiration - intubated  7/16 LE venous duplex - no DVT 7/37 TEE - no embolic source - negative bubble study - normal EF 7/18 extubated > recurrent aspiration > re-intubated  7/18 bronchoscopy for culture 7/20 tracheostomy  7/23 TRH resumed care   Subjective: Appears to remain stable over the weekend with improvement in respiratory status.  Has moved his bowels.  He is presently tolerating to bees without significant difficulty.  Is alert and follows examiner in the room but does not respond to questions.  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 noted on CTa - Neuro suggests "dual antiplatelet therapy for 3 months followed by Plavix alone" - will need extensive PT/OT and SLP - to consider loop recorder at outpt - TEE w/o evidence of embolic source - plan is for SNF placement   [name] - tx was initiated 7/27 - will plan to complete tx course   Atrial septal aneurysm TEE ruled out embolic source  Sinus tachycardia HR has normalized   Dysphagia Pt has PEG tube in place - ongoing aspiration remains a problem - tolerating reglan presently - cont tube feeds as per Nutrition - SLOWLY advancing toward ultimate goal of 50cc/hr   Ileus  Resolved as of 7/29 - cont reglan  HLD Cont tx  HTN Blood pressure now well controlled   Rhabdomyolysis CK normalized  Acute renal failure - resolved  Creatinine peaked at 2.6 - renal function has normalized  History of alcoholism  Mild transaminits  resolved - etiology unclear - viral hep panel negative - likely shock liver   Tobacco abuse  Severe malnutrition in context of acute illness Tube feeding continues   Normocytic anemia Likely nutrition related - no evidence of blood loss - follow trend   DVT prophylaxis: SCDs Code Status: FULL CODE Family Communication: spoke w/ sister at bedside    Disposition Plan: transfer to tele bed - begin search for SNF   Consultants:  Neurology Palliative Care   Procedures: none  Antimicrobials:  None presently  Last dose of Zosyn 7/21  Objective: Blood pressure 121/64, pulse 81, temperature 98.7  F (37.1 C), temperature source Oral, resp. rate 19, height 5' 11"  (1.803 m), weight 71.8 kg (158 lb 4.6 oz), SpO2 100 %.  Intake/Output Summary (Last 24 hours) at 11/13/16 1548 Last data filed at 11/13/16 1315  Gross per 24 hour  Intake          1808.66 ml  Output             1625 ml  Net           183.66 ml   Filed Weights   11/12/16 0434 11/13/16 0452 11/13/16 1304    Weight: 67.1 kg (147 lb 14.9 oz) 68.8 kg (151 lb 10.8 oz) 71.8 kg (158 lb 4.6 oz)    Examination: General: pt non-communicative - no acute resp distress Lungs: Mild bibasilar crackles with no wheezing Cardiovascular: Regular rate and rhythm Abdomen: NT/ND, soft, bowel sounds positive - PEG insertion clean  Extremities: No edema B LE   CBC:  Recent Labs Lab 11/08/16 0521 11/09/16 0302 11/10/16 0751 11/12/16 0232 11/13/16 0200  WBC 27.6* 22.1* 18.4* 17.8* 19.1*  NEUTROABS 21.2*  --  13.4* 12.8*  --   HGB 7.5* 8.9* 7.7* 8.6* 8.5*  HCT 23.2* 28.0* 24.2* 27.0* 26.0*  MCV 86.9 87.8 88.6 88.5 87.2  PLT 710* 808* 791* 903* 355*   Basic Metabolic Panel:  Recent Labs Lab 11/09/16 0302 11/10/16 0249 11/11/16 0331 11/12/16 0232 11/13/16 0200  NA 140 139 138 136 133*  K 4.5 4.1 4.0 3.8 4.2  CL 104 105 104 99* 101  CO2 26 25 26 26 24   GLUCOSE 80 88 95 109* 109*  BUN 16 15 9 7 8   CREATININE 1.00 0.95 0.84 0.84 0.97  CALCIUM 9.8 9.5 9.3 9.6 9.6  MG 2.0 1.9 1.8 1.8 1.7  PHOS  --   --  3.6  --   --    GFR: Estimated Creatinine Clearance: 76.1 mL/min (by C-G formula based on SCr of 0.97 mg/dL).  Liver Function Tests:  Recent Labs Lab 11/11/16 0331  AST 19  ALT 16*  ALKPHOS 86  BILITOT 0.7  PROT 6.6  ALBUMIN 1.6*     Recent Results (from the past 240 hour(s))  Culture, Urine     Status: Abnormal   Collection Time: 11/06/16  3:32 PM  Result Value Ref Range Status   Specimen Description URINE, RANDOM  Final   Special Requests NONE  Final   Culture >=100,000 COLONIES/mL YEAST (A)  Final   Report Status 11/07/2016 FINAL  Final  Culture, blood (routine x 2)     Status: None   Collection Time: 11/06/16  4:14 PM  Result Value Ref Range Status   Specimen Description BLOOD RIGHT HAND  Final   Special Requests IN PEDIATRIC BOTTLE Blood Culture adequate volume  Final   Culture NO GROWTH 5 DAYS  Final   Report Status 11/11/2016 FINAL  Final  Culture, blood (routine x  2)     Status: None   Collection Time: 11/06/16  4:46 PM  Result Value Ref Range Status   Specimen Description BLOOD LEFT ARM  Final   Special Requests IN PEDIATRIC BOTTLE Blood Culture adequate volume  Final   Culture NO GROWTH 5 DAYS  Final   Report Status 11/11/2016 FINAL  Final  Culture, blood (routine x 2)     Status: None (Preliminary result)   Collection Time: 11/09/16 12:57 PM  Result Value Ref Range Status   Specimen Description BLOOD RIGHT ANTECUBITAL  Final  Special Requests IN PEDIATRIC BOTTLE Blood Culture adequate volume  Final   Culture NO GROWTH 4 DAYS  Final   Report Status PENDING  Incomplete  Culture, blood (routine x 2)     Status: None (Preliminary result)   Collection Time: 11/09/16 12:57 PM  Result Value Ref Range Status   Specimen Description BLOOD LEFT HAND  Final   Special Requests IN PEDIATRIC BOTTLE Blood Culture adequate volume  Final   Culture NO GROWTH 4 DAYS  Final   Report Status PENDING  Incomplete  Culture, blood (routine x 2)     Status: None (Preliminary result)   Collection Time: 11/10/16  9:51 PM  Result Value Ref Range Status   Specimen Description BLOOD BLOOD LEFT HAND  Final   Special Requests   Final    BOTTLES DRAWN AEROBIC ONLY Blood Culture adequate volume   Culture NO GROWTH 3 DAYS  Final   Report Status PENDING  Incomplete  Culture, blood (routine x 2)     Status: None (Preliminary result)   Collection Time: 11/10/16  9:59 PM  Result Value Ref Range Status   Specimen Description BLOOD BLOOD LEFT HAND  Final   Special Requests   Final    BOTTLES DRAWN AEROBIC AND ANAEROBIC Blood Culture results may not be optimal due to an excessive volume of blood received in culture bottles   Culture NO GROWTH 3 DAYS  Final   Report Status PENDING  Incomplete     Scheduled Meds: . amLODipine  10 mg Per Tube Daily  . aspirin  325 mg Per Tube Daily  . chlorhexidine gluconate (MEDLINE KIT)  15 mL Mouth Rinse BID  . cloNIDine  0.1 mg Per Tube  BID  . famotidine  20 mg Per Tube BID  . free water  100 mL Per Tube QID  . heparin subcutaneous  5,000 Units Subcutaneous Q8H  . insulin aspart  0-9 Units Subcutaneous Q4H  . mouth rinse  15 mL Mouth Rinse QID  . metoCLOPramide (REGLAN) injection  10 mg Intravenous Q6H  . metoprolol tartrate  100 mg Per Tube BID  . multivitamin with minerals  1 tablet Per Tube Daily  . nicotine  14 mg Transdermal Daily  . pravastatin  20 mg Per Tube q1800  . thiamine  100 mg Per Tube Daily     LOS: 25 days   Cherene Altes, MD [facility] Office  [phone] Pager - Text Page per Amion as per below:  On-Call/Text Page:      [email or web address]      password [email or web address]  If 7PM-7AM, please contact night-coverage [email or web address] Password [facility] 11/13/2016, 3:48 PM
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
## 2016-11-14 LAB — CULTURE, BLOOD (ROUTINE X 2)
Culture: NO GROWTH
Culture: NO GROWTH
Special Requests: ADEQUATE
Special Requests: ADEQUATE

## 2016-11-14 LAB — GLUCOSE, CAPILLARY
Glucose-Capillary: 101 mg/dL — ABNORMAL HIGH (ref 65–99)
Glucose-Capillary: 105 mg/dL — ABNORMAL HIGH (ref 65–99)
Glucose-Capillary: 111 mg/dL — ABNORMAL HIGH (ref 65–99)
Glucose-Capillary: 130 mg/dL — ABNORMAL HIGH (ref 65–99)
Glucose-Capillary: 91 mg/dL (ref 65–99)
Glucose-Capillary: 95 mg/dL (ref 65–99)

## 2016-11-14 LAB — CBC
HCT: 22.9 % — ABNORMAL LOW (ref 39.0–52.0)
Hemoglobin: 7.4 g/dL — ABNORMAL LOW (ref 13.0–17.0)
MCH: 27.9 pg (ref 26.0–34.0)
MCHC: 32.3 g/dL (ref 30.0–36.0)
MCV: 86.4 fL (ref 78.0–100.0)
Platelets: 910 10*3/uL (ref 150–400)
RBC: 2.65 MIL/uL — ABNORMAL LOW (ref 4.22–5.81)
RDW: 15.1 % (ref 11.5–15.5)
WBC: 16.8 10*3/uL — ABNORMAL HIGH (ref 4.0–10.5)

## 2016-11-14 LAB — BASIC METABOLIC PANEL
Anion gap: 7 (ref 5–15)
BUN: 10 mg/dL (ref 6–20)
CO2: 25 mmol/L (ref 22–32)
Calcium: 9.7 mg/dL (ref 8.9–10.3)
Chloride: 102 mmol/L (ref 101–111)
Creatinine, Ser: 0.93 mg/dL (ref 0.61–1.24)
GFR calc Af Amer: >60 mL/min (ref >60)
GFR calc non Af Amer: >60 mL/min (ref >60)
Glucose, Bld: 104 mg/dL — ABNORMAL HIGH (ref 65–99)
Potassium: 3.8 mmol/L (ref 3.5–5.1)
Sodium: 134 mmol/L — ABNORMAL LOW (ref 135–145)

## 2016-11-14 LAB — HAPTOGLOBIN: Haptoglobin: 560 mg/dL — ABNORMAL HIGH (ref 34–200)

## 2016-11-14 MED ORDER — SODIUM CHLORIDE 3 % IN NEBU
4.0000 mL | INHALATION_SOLUTION | Freq: Every day | RESPIRATORY_TRACT | Status: AC
  Administered 2016-11-14 – 2016-11-15 (×3): 4 mL via RESPIRATORY_TRACT
  Filled 2016-11-14 (×2): qty 4

## 2016-11-14 MED ORDER — CHLORHEXIDINE GLUCONATE 0.12 % MT SOLN
OROMUCOSAL | Status: AC
  Administered 2016-11-14: 23:00:00
  Filled 2016-11-14: qty 15

## 2016-11-14 MED ORDER — FLUCONAZOLE 100 MG PO TABS
200.0000 mg | ORAL_TABLET | Freq: Every day | ORAL | Status: AC
  Administered 2016-11-14 – 2016-11-24 (×11): 200 mg
  Filled 2016-11-14 (×11): qty 2

## 2016-11-14 NOTE — Care Management Important Message (Signed)
Important Message  Patient Details  Name: SERGE MAIN MRN: [id number] Date of Birth: Sep 03, 1950   Medicare Important Message Given:  Yes    Teresha Hanks 11/14/2016, 11:45 AM

## 2016-11-14 NOTE — Care Management Note (Signed)
Case Management Note  Patient Details  Name: Scott Rivera MRN: [id number] Date of Birth: 1950/07/02  Subjective/Objective:                    Action/Plan: Plan is for SNF when patient is medically ready. CM following.   Expected Discharge Date:  10/25/16               Expected Discharge Plan:  Skilled Nursing Facility  In-House Referral:  Clinical Social Work  Discharge planning Services  CM Consult  Post Acute Care Choice:  IP Rehab Choice offered to:     DME Arranged:    DME Agency:     HH Arranged:    [place] Agency:     Status of Service:  In process, will continue to follow  If discussed at Long Length of Stay Meetings, dates discussed:    Additional Comments:  Pollie Friar, RN 11/14/2016, 2:08 PM

## 2016-11-14 NOTE — Progress Notes (Signed)
Responded to page and conferred w/ nurse re: trached pt's sister's desire for pt to do advanced directive. Nurse did not feel pt could do it now. After seeing pt and visiting w/ sister and another emale family member in rm, I concur. Explained MC process to sister, who was knowledgeable about it and had form filled out. She ultimately understood that it could not be done here w/ithout clear indication of pt's own wishes to do so. She said at times he is more alert and can nod.   Explained that if pt is unable to speak for himself and has no advanced directive, if need be, doctors would confer with the family anyway on his behalf. Nurse said pt has been living with sister, who has been caring for him, so sister seemed to relax once she understood that. Provided emotional/spiritual support, ministry of presence/touch and prayer to family -- which they much appreciated. Pt is [place] available for f/u.   11/14/16 1000  Clinical Encounter Type  Visited With Patient and family together;Health care provider  Visit Type Initial;Psychological support;Spiritual support;Social support  Referral From Nurse  Spiritual Encounters  Spiritual Needs Brochure;Prayer;Emotional  Stress Factors  Patient Stress Factors Health changes;Loss of control  Family Stress Factors Family relationships;Health changes;Loss of control   Gerrit Heck, Chaplain

## 2016-11-14 NOTE — Progress Notes (Signed)
Patient came into the unit in company of RN and NT from ICU via bed. On skin assessment, stage one sacral ulcer to mid sacrum with mepilex dressing, open scar to fore head and bilateral dryness to heels with moon boots on. Patient responsive and on trach collar will continue monitor.
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
## 2016-11-14 NOTE — Progress Notes (Signed)
Palliative Care  I revisited [name] today, family was present at the bedside. His sister was joyful on hearing that discharge was being planned. She was appreciative of the care he has received here, and is looking forward to ongoing improvement with the goal of eventually being able to get him home. She is clear that the goal is to prolong life, and does want all aggressive measures taken to aid in that goal. [name] appears quite comfortable in bed with no distress or obvious symptom burden.   Palliative will sign-off at this point as GOC are clear and symptoms well managed. Please re-consult as needed. Thank you for letting us take care of [name].  Charlynn Court AGNP-C Palliative Care [phone]  No charge note.

## 2016-11-14 NOTE — Progress Notes (Signed)
PROGRESS NOTE  CASHMERE HARMES KOE:[id number] DOB: October 26, 1950 DOA: 10/19/2016 PCP: Patient, No Pcp Per  HPI/Recap of past 24 hours: Cc 66-year-old male with past mental history of hypertension brought in by family to [facility] on 7/1 after being found down. At that time, MRI noted multiple acute infarcts involving both cerebellar hemispheres plus right occipital lobe, right midbrain and left thalamus. At time of admission, patient also noted to be in acute kidney injury plus rhabdomyolysis. Family requested transfer to [facility] and this was done on 7/5.  Patient's hospital course was noted for placement of PEG tube on 7/13 and then respiratory distress leading to intubation following aspiration event on 7/14. Patient extubated, but then had recurrent aspiration and reintubated on 7/18. Patient underwent tracheostomy on 7/20. Hospitalist resume care on 7/23 the patient overall has felt to be stable and moved up to the floor on 7/30.  No events overnight. Social work in process of finding a skilled nursing facility that accepts tracheostomy. Patient's mentation waxes and wanes along with alertness. Being followed by speech therapy. Currently not able to vocalize.  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 - Neuro suggests "dual antiplatelet therapy for 3 months followed by Plavix alone" - will need extensive PT/OT and SLP - to consider loop recorder at outpt - TEE w/o evidence of embolic source - plan is for SNF placement   [name] - tx was  initiated 7/27 - will plan to complete tx course   Atrial septal aneurysm TEE ruled out embolic source  Sinus tachycardia HR has normalized   Dysphagia Pt has PEG tube in place - ongoing aspiration remains a problem - tolerating reglan presently - cont tube feeds as per Nutrition - SLOWLY advancing toward ultimate goal of 50cc/hr   Ileus  Resolved as of 7/29 - cont reglan  HLD Cont tx  HTN Blood pressure now well controlled   Rhabdomyolysis-resolved with IV fluids CK normalized  Acute renal failure - resolved  Creatinine peaked at 2.6 - renal function has normalized  History of alcoholism  Mild transaminits  resolved - etiology unclear - viral hep panel negative - likely shock liver   Tobacco abuse  Severe malnutrition in context of acute illness Tube feeding continues   Normocytic anemia Likely nutrition related - no evidence of blood loss - follow trend.  Down to 7.4 today. Recheck tomorrow.  Code Status: Full code as per family   Family Communication: Sister and niece at the bedside   Disposition Plan: Transfer to skilled nursing in next 2 days once bed available    Consultants:  Neurology  Pelvic care  Critical care   Procedures:  7/13 PEG tube inserted   7/14 to ICU after vomit w/ aspiration - intubated   7/16 LE venous duplex - no DVT  5/05 TEE - no embolic source - negative bubble study - normal EF  7/18 extubated > recurrent aspiration > re-intubated   7/18 bronchoscopy for culture  7/20 tracheostomy    Antimicrobials:  IV Zosyn 7/14-7/21  DVT prophylaxis:  Currently on SCDs  Objective: Vitals:   11/14/16 0747 11/14/16 1025 11/14/16 1259 11/14/16 1300  BP:  (!) 174/68    Pulse: 88 (!) 107  88  Resp: 18 16  18   Temp:  99.8 F (37.7 C)    TempSrc:  Axillary    SpO2: 99% 99% 99% 99%  Weight:      Height:        Intake/Output Summary (Last 24 hours) at 11/14/16 1420 Last data filed at 11/14/16 1200  Gross  per 24 hour  Intake              180 ml  Output             1291 ml  Net            -1111 ml   Filed Weights   11/12/16 0434 11/13/16 0452 11/13/16 1304  Weight: 67.1 kg (147 lb 14.9 oz) 68.8 kg (151 lb 10.8 oz) 71.8 kg (158 lb 4.6 oz)    Exam:   General:  Intermittently awake, nonfocal, does not follow commands  HEENT: Normocephalic, because membranes slightly dry, poor dentition   Cardiovascular: Regular rate and rhythm, S1-S2   Respiratory: Clear to auscultation bilaterally   Abdomen: PEG tube in place, nondistended, hypoactive bowel sounds   Musculoskeletal: No clubbing or cyanosis, SCDs, trace pitting edema, minutes  Skin: No skin breaks, tears or lesions  Psychiatry: Encephalopathy secondary to CVAs    Data Reviewed: CBC:  Recent Labs Lab 11/08/16 0521 11/09/16 0302 11/10/16 0751 11/12/16 0232 11/13/16 0200 11/14/16 0259  WBC 27.6* 22.1* 18.4* 17.8* 19.1* 16.8*  NEUTROABS 21.2*  --  13.4* 12.8*  --   --   HGB 7.5* 8.9* 7.7* 8.6* 8.5* 7.4*  HCT 23.2* 28.0* 24.2* 27.0* 26.0* 22.9*  MCV 86.9 87.8 88.6 88.5 87.2 86.4  PLT 710* 808* 791* 903* 852* 102*   Basic Metabolic Panel:  Recent Labs Lab 11/09/16 0302 11/10/16 0249 11/11/16 0331 11/12/16 0232 11/13/16 0200 11/14/16 0259  NA 140 139 138 136 133* 134*  K 4.5 4.1 4.0 3.8 4.2 3.8  CL 104 105 104 99* 101 102  CO2 26 25 26 26 24 25   GLUCOSE 80 88 95 109* 109* 104*  BUN 16 15 9 7 8 10   CREATININE 1.00 0.95 0.84 0.84 0.97 0.93  CALCIUM 9.8 9.5 9.3 9.6 9.6 9.7  MG 2.0 1.9 1.8 1.8 1.7  --   PHOS  --   --  3.6  --   --   --    GFR: Estimated Creatinine Clearance: 79.3 mL/min (by C-G formula based on SCr of 0.93 mg/dL). Liver Function Tests:  Recent Labs Lab 11/11/16 0331  AST 19  ALT 16*  ALKPHOS 86  BILITOT 0.7  PROT 6.6  ALBUMIN 1.6*   No results for input(s): LIPASE, AMYLASE in the last 168 hours. No results for input(s): AMMONIA in the last 168 hours. Coagulation Profile: No  results for input(s): INR, PROTIME in the last 168 hours. Cardiac Enzymes: No results for input(s): CKTOTAL, CKMB, CKMBINDEX, TROPONINI in the last 168 hours. BNP (last 3 results) No results for input(s): PROBNP in the last 8760 hours. HbA1C: No results for input(s): HGBA1C in the last 72 hours. CBG:  Recent Labs Lab 11/13/16 2001 11/13/16 2343 11/14/16 0420 11/14/16 0732 11/14/16 1131  GLUCAP 119* 101* 105* 91 101*   Lipid Profile: No results for input(s): CHOL, HDL, LDLCALC, TRIG, CHOLHDL, LDLDIRECT in the last 72 hours. Thyroid Function Tests: No results  for input(s): TSH, T4TOTAL, FREET4, T3FREE, THYROIDAB in the last 72 hours. Anemia Panel:  Recent Labs  11/12/16 1847  VITAMINB12 976*  FOLATE 23.3  FERRITIN 1,808*  TIBC 169*  IRON 16*  RETICCTPCT 1.6   Urine analysis:    Component Value Date/Time   COLORURINE YELLOW 11/06/2016 1532   APPEARANCEUR HAZY (A) 11/06/2016 1532   LABSPEC 1.011 11/06/2016 1532   PHURINE 5.0 11/06/2016 1532   GLUCOSEU NEGATIVE 11/06/2016 1532   HGBUR MODERATE (A) 11/06/2016 1532   BILIRUBINUR NEGATIVE 11/06/2016 1532   KETONESUR NEGATIVE 11/06/2016 1532   PROTEINUR 30 (A) 11/06/2016 1532   NITRITE NEGATIVE 11/06/2016 1532   LEUKOCYTESUR LARGE (A) 11/06/2016 1532   Sepsis Labs: '@LABRCNTIP'$ (procalcitonin:4,lacticidven:4)  ) Recent Results (from the past 240 hour(s))  Culture, Urine     Status: Abnormal   Collection Time: 11/06/16  3:32 PM  Result Value Ref Range Status   Specimen Description URINE, RANDOM  Final   Special Requests NONE  Final   Culture >=100,000 COLONIES/mL YEAST (A)  Final   Report Status 11/07/2016 FINAL  Final  Culture, blood (routine x 2)     Status: None   Collection Time: 11/06/16  4:14 PM  Result Value Ref Range Status   Specimen Description BLOOD RIGHT HAND  Final   Special Requests IN PEDIATRIC BOTTLE Blood Culture adequate volume  Final   Culture NO GROWTH 5 DAYS  Final   Report Status 11/11/2016  FINAL  Final  Culture, blood (routine x 2)     Status: None   Collection Time: 11/06/16  4:46 PM  Result Value Ref Range Status   Specimen Description BLOOD LEFT ARM  Final   Special Requests IN PEDIATRIC BOTTLE Blood Culture adequate volume  Final   Culture NO GROWTH 5 DAYS  Final   Report Status 11/11/2016 FINAL  Final  Culture, blood (routine x 2)     Status: None (Preliminary result)   Collection Time: 11/09/16 12:57 PM  Result Value Ref Range Status   Specimen Description BLOOD RIGHT ANTECUBITAL  Final   Special Requests IN PEDIATRIC BOTTLE Blood Culture adequate volume  Final   Culture NO GROWTH 4 DAYS  Final   Report Status PENDING  Incomplete  Culture, blood (routine x 2)     Status: None (Preliminary result)   Collection Time: 11/09/16 12:57 PM  Result Value Ref Range Status   Specimen Description BLOOD LEFT HAND  Final   Special Requests IN PEDIATRIC BOTTLE Blood Culture adequate volume  Final   Culture NO GROWTH 4 DAYS  Final   Report Status PENDING  Incomplete  Culture, blood (routine x 2)     Status: None (Preliminary result)   Collection Time: 11/10/16  9:51 PM  Result Value Ref Range Status   Specimen Description BLOOD BLOOD LEFT HAND  Final   Special Requests   Final    BOTTLES DRAWN AEROBIC ONLY Blood Culture adequate volume   Culture NO GROWTH 3 DAYS  Final   Report Status PENDING  Incomplete  Culture, blood (routine x 2)     Status: None (Preliminary result)   Collection Time: 11/10/16  9:59 PM  Result Value Ref Range Status   Specimen Description BLOOD BLOOD LEFT HAND  Final   Special Requests   Final    BOTTLES DRAWN AEROBIC AND ANAEROBIC Blood Culture results may not be optimal due to an excessive volume of blood received in culture bottles   Culture NO GROWTH 3 DAYS  Final  Report Status PENDING  Incomplete      Studies: No results found.  Scheduled Meds: . amLODipine  10 mg Per Tube Daily  . aspirin  325 mg Per Tube Daily  . chlorhexidine  gluconate (MEDLINE KIT)  15 mL Mouth Rinse BID  . cloNIDine  0.1 mg Per Tube BID  . clopidogrel  75 mg Per Tube Daily  . famotidine  20 mg Per Tube BID  . fluconazole  200 mg Per Tube q1800  . heparin subcutaneous  5,000 Units Subcutaneous Q8H  . insulin aspart  0-9 Units Subcutaneous Q4H  . mouth rinse  15 mL Mouth Rinse QID  . metoCLOPramide  10 mg Per Tube TID AC & HS  . metoprolol tartrate  100 mg Per Tube BID  . multivitamin with minerals  1 tablet Per Tube Daily  . nicotine  14 mg Transdermal Daily  . pravastatin  20 mg Per Tube q1800  . sodium chloride HYPERTONIC  4 mL Nebulization Daily  . thiamine  100 mg Per Tube Daily    Continuous Infusions: . feeding supplement (VITAL AF 1.2 CAL) 1,000 mL (11/13/16 1717)     LOS: 26 days     Annita Brod, MD [facility] Pager [phone]  If 7PM-7AM, please contact night-coverage [email or web address] Password [email or web address] 11/14/2016, 2:20 PM

## 2016-11-15 DIAGNOSIS — R0603 Acute respiratory distress: Secondary | ICD-10-CM

## 2016-11-15 LAB — GLUCOSE, CAPILLARY
Glucose-Capillary: 87 mg/dL (ref 65–99)
Glucose-Capillary: 106 mg/dL — ABNORMAL HIGH (ref 65–99)
Glucose-Capillary: 108 mg/dL — ABNORMAL HIGH (ref 65–99)
Glucose-Capillary: 102 mg/dL — ABNORMAL HIGH (ref 65–99)
Glucose-Capillary: 107 mg/dL — ABNORMAL HIGH (ref 65–99)

## 2016-11-15 LAB — CULTURE, BLOOD (ROUTINE X 2)
Culture: NO GROWTH
Culture: NO GROWTH
Special Requests: ADEQUATE

## 2016-11-15 MED ORDER — PRO-STAT SUGAR FREE PO LIQD
30.0000 mL | Freq: Three times a day (TID) | ORAL | Status: DC
  Administered 2016-11-15 – 2016-11-29 (×42): 30 mL
  Filled 2016-11-15 (×42): qty 30

## 2016-11-15 MED ORDER — JEVITY 1.5 CAL/FIBER PO LIQD
1000.0000 mL | ORAL | Status: DC
  Administered 2016-11-15 – 2016-11-29 (×15): 1000 mL
  Filled 2016-11-15 (×19): qty 1000

## 2016-11-15 NOTE — Progress Notes (Signed)
Responded to consult to create advanced directive. No family was in rm, and again, pt is not alert enough to be able to complete form. Conferred w/ nurse, who concurred, and prayed silently for pt.  See note from 7/31 recounting conversation w/ pt's sister to same effect. Chaplain available for f/u.   11/15/16 1400  Clinical Encounter Type  Visited With Patient not available;Health care provider  Visit Type Follow-up  Referral From Nurse   Gerrit Heck, Chaplain

## 2016-11-15 NOTE — Progress Notes (Signed)
  Speech Language Pathology Treatment: [name] Speaking valve  Patient Details Name: Scott Rivera MRN: [id number] DOB: 1950/12/06 Today's Date: 11/15/2016 Time: 1310-1320 SLP Time Calculation (min) (ACUTE ONLY): 10 min  Assessment / Plan / Recommendation Clinical Impression  Pt tolerated his PMV for 10 minutes without overt signs of intolerance, but he was lethargic throughout treatment and not making attempts to phonate despite Max multimodal cueing from therapist and sister.His cuff was deflated upon SLP arrival with audible secretions heard. Pt did not respond to cues for volitional cough before and during PMV placement, but he did have spontaneous coughing upon valve removal that was productive of secretions at the tracheal level. Recommend to continue use under full staff supervision.    HPI HPI: 66 y.o. male with history of hypertension was brought to [facility] on 10/15/2016 after patient was found to be confused. As per the family who provided most of the history patient was not able to be raised and family went to check on him at home and was found on the floor confused. Patient was taken to the ER. The last contact was 2 days ago by patient's brother prior to the episode. At [facility] MRI showed multiple acute infarcts involving the both hemisphere of the cerebellum and right occipital lobe right midbrain and left thalamus the fourth ventricle was patent no hydrocephalus. 2-D echo showing EF of 55-60% normal LV size. Carotid Doppler was done which showed right ICA 40-59% stenosis with left 60-79% stenosis. Chest x-ray was showing infiltrates and patient also was febrile for which patient was started on antibiotics. Patient's creatinine at admission was 2.6 which improved to 1.5 the time of discharge. Patient also had rhabdomyolysis with CK of 7000 at the time of admission. As per the note patient was initially agitated requiring Haldol at the  other hospital. Patient's mentation slowly improved gradually and family requested transfer to [facility] after discussing with neurologist at [facility] Dr. Leonel Ramsay      SLP Plan  Continue with current plan of care       Recommendations         Patient may use Passy-Muir Speech Valve: During all therapies with supervision;Intermittently with supervision PMSV Supervision: Full MD: Please consider changing trach tube to : Cuffless         Oral Care Recommendations: Oral care QID Follow up Recommendations: Skilled Nursing facility SLP Visit Diagnosis: Aphonia (R49.1) Plan: Continue with current plan of care       GO                Scott Rivera 11/15/2016, 1:39 PM  Scott Rivera, M.A. CCC-SLP [phone]

## 2016-11-15 NOTE — Evaluation (Signed)
Clinical/Bedside Swallow Evaluation Patient Details  Name: Scott Rivera MRN: [id number] Date of Birth: Aug 20, 1950  Today's Date: 11/15/2016 Time: SLP Start Time (ACUTE ONLY): 1320 SLP Stop Time (ACUTE ONLY): 1329 SLP Time Calculation (min) (ACUTE ONLY): 9 min  Past Medical History:  Past Medical History:  Diagnosis Date  . Hypertension   . Stroke [facility])    Past Surgical History:  Past Surgical History:  Procedure Laterality Date  . IR GASTROSTOMY TUBE MOD SED  10/27/2016  . KNEE SURGERY     HPI:  66 y.o. male with history of hypertension was brought to [facility] on 10/15/2016 after patient was found to be confused. As per the family who provided most of the history patient was not able to be raised and family went to check on him at home and was found on the floor confused. Patient was taken to the ER. The last contact was 2 days ago by patient's brother prior to the episode. At [facility] MRI showed multiple acute infarcts involving the both hemisphere of the cerebellum and right occipital lobe right midbrain and left thalamus the fourth ventricle was patent no hydrocephalus. 2-D echo showing EF of 55-60% normal LV size. Carotid Doppler was done which showed right ICA 40-59% stenosis with left 60-79% stenosis. Chest x-ray was showing infiltrates and patient also was febrile for which patient was started on antibiotics. Patient's creatinine at admission was 2.6 which improved to 1.5 the time of discharge. Patient also had rhabdomyolysis with CK of 7000 at the time of admission. As per the note patient was initially agitated requiring Haldol at the other hospital. Patient's mentation slowly improved gradually and family requested transfer to [facility] after discussing with neurologist at [facility] Dr. Leonel Ramsay   Assessment / Plan / Recommendation Clinical Impression  Pt with signs of a significant dysphagia earlier this admission  now presents s/p intubations and trach placement. He is also lethargic this afternoon and not making attempts to accept ice chips brought to his lips. He does have minimal labial movements to stimulation. Recommend to remain NPO with SLP to follow for additional trials. SLP Visit Diagnosis: Dysphagia, unspecified (R13.10)    Aspiration Risk  Severe aspiration risk    Diet Recommendation NPO   Medication Administration: Via alternative means    Other  Recommendations Oral Care Recommendations: Oral care QID   Follow up Recommendations Skilled Nursing facility      Frequency and Duration min 2x/week  2 weeks       Prognosis Prognosis for Safe Diet Advancement: Fair Barriers to Reach Goals: Cognitive deficits;Severity of deficits      Swallow Study   General HPI: 66 y.o. male with history of hypertension was brought to [facility] on 10/15/2016 after patient was found to be confused. As per the family who provided most of the history patient was not able to be raised and family went to check on him at home and was found on the floor confused. Patient was taken to the ER. The last contact was 2 days ago by patient's brother prior to the episode. At [facility] MRI showed multiple acute infarcts involving the both hemisphere of the cerebellum and right occipital lobe right midbrain and left thalamus the fourth ventricle was patent no hydrocephalus. 2-D echo showing EF of 55-60% normal LV size. Carotid Doppler was done which showed right ICA 40-59% stenosis with left 60-79% stenosis. Chest  x-ray was showing infiltrates and patient also was febrile for which patient was started on antibiotics. Patient's creatinine at admission was 2.6 which improved to 1.5 the time of discharge. Patient also had rhabdomyolysis with CK of 7000 at the time of admission. As per the note patient was initially agitated requiring Haldol at the other hospital. Patient's  mentation slowly improved gradually and family requested transfer to [facility] after discussing with neurologist at [facility] Dr. Leonel Ramsay Type of Study: Bedside Swallow Evaluation Previous Swallow Assessment: BSE earlier this admission (7/6) with concern for significant dysphagia and gross aspiration Diet Prior to this Study: NPO;PEG tube Temperature Spikes Noted: Yes (100.9) Respiratory Status: Trach;Trach Collar Trach Size and Type: Cuff;#6;Deflated;With PMSV in place History of Recent Intubation: Yes Length of Intubations (days): 5 days (two intubations) Date extubated:  (trach 7/20) Behavior/Cognition: Lethargic/Drowsy Oral Care Completed by SLP: No Oral Cavity - Dentition: Poor condition;Missing dentition Vision: Functional for self-feeding Self-Feeding Abilities: Total assist Patient Positioning: Upright in bed Baseline Vocal Quality: Not observed Volitional Cough: Cognitively unable to elicit Volitional Swallow: Unable to elicit    Oral/Motor/Sensory Function     Ice Chips Ice chips: Impaired Presentation: Spoon Oral Phase Impairments: Poor awareness of bolus;Other (comment) (no acceptance)   Thin Liquid Thin Liquid: Not tested    Nectar Thick Nectar Thick Liquid: Not tested   Honey Thick Honey Thick Liquid: Not tested   Puree Puree: Not tested   Solid   GO   Solid: Not tested        Scott Rivera 11/15/2016,1:55 PM  Scott Rivera, M.A. CCC-SLP [phone]

## 2016-11-15 NOTE — Progress Notes (Signed)
PROGRESS NOTE    HASEEB FIALLOS  YTK:[id number] DOB: 11/16/1950 DOA: 10/19/2016 PCP: Patient, No Pcp Per    Brief Narrative:  66 year-old male with past mental history of hypertension brought in by family to [facility] on 7/1 after being found down. At that time, MRI noted multiple acute infarcts involving both cerebellar hemispheres plus right occipital lobe, right midbrain and left thalamus. At time of admission, patient also noted to be in acute kidney injury plus rhabdomyolysis. Family requested transfer to [facility] and this was done on 7/5.  Patient's hospital course was noted for placement of PEG tube on 7/13 and then respiratory distress leading to intubation following aspiration event on 7/14. Patient extubated, but then had recurrent aspiration and reintubated on 7/18. Patient underwent tracheostomy on 7/20. Hospitalist resume care on 7/23 the patient overall has felt to be stable and moved up to the floor on 7/30.  No events overnight. Social work in process of finding a skilled nursing facility that accepts tracheostomy. Patient's mentation waxes and wanes along with alertness. Being followed by speech therapy. Currently not able to vocalize.  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[place]) Active Problems:   ARF (acute renal failure) (HCC)   Essential hypertension   Rhabdomyolysis   Acute ischemic stroke (HCC)   Pressure injury of skin   Dysphagia, post-stroke   Benign essential HTN   Tobacco abuse   Tachypnea   Hyperglycemia   Hypernatremia   Leukocytosis   Acute blood loss anemia   Chronic kidney disease   Septic shock (HCC)   Acute respiratory failure (HCC)   Aspiration into airway   Aspiration pneumonia (HCC)   Goals of care, counseling/discussion   Palliative care by specialist  Principal Problem: Severe sepsis w/ Extensive left lung Aspiration pneumonia with possible mucous plugging - recurrent aspiration  - S/p trach  7/20 - appears to have stabilized for now - remains high risk for recurrent aspiration - continue suctioning. 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 - Neuro suggests "dual antiplatelet therapy for 3 months followed by Plavix alone" - will need extensive PT/OT and SLP - to consider loop recorder at outpt - TEE w/o evidence of embolic source - plan is for SNF placement  - currently stable  Yeast in urine - Suspect this is not indicative of a pathogen - tx was initiated 7/27 - Will complete tx course  Atrial septal aneurysm - TEE ruled out embolic source - stable at present  Sinus tachycardia - currently rate controlled  Dysphagia - Pt has PEG tube in place - concerns about risk of ongoing aspiration -Patient is tolerating reglan  - Continue with tube feeds as tolerated  Ileus  Resolved as of 7/29 - Continue reglan as tolerated  HLD Cont tx - Stable at present  HTN - BP stable at present. Vitals reviewed  Rhabdomyolysis-resolved with IV fluids - CK has normalized  Acute renal failure - resolved  - Creatinine peaked at 2.6 - Repeat bmet in AM  History of alcoholism - Stable currently  Mild transaminits  - Unclear etiology - Much improved. Labs reviewed  Tobacco abuse - stable  Severe malnutrition in context of acute illness - Continue tube feeding as tolerated  Normocytic anemia - Likely nutrition related - no  evidence of blood loss - Hgb 7.4 on 3/31. Will repeat cbc in AM - Hemodynamically stable at present  DVT prophylaxis: Heparin subQ Code Status: Full Family Communication: Pt in room, family at bedside Disposition Plan: Anticipate SNF when amount of secretions has improved  Consultants:   Neurology  Critical care  Palliative Care    Procedures:   7/13 PEG tube inserted   7/14 to ICU after vomit w/ aspiration - intubated   7/16 LE venous duplex - no DVT  1/61 TEE - no embolic source - negative bubble study - normal EF  7/18 extubated >recurrent aspiration >re-intubated   7/18 bronchoscopy for culture  7/20 tracheostomy    Antimicrobials: Anti-infectives    Start     Dose/Rate Route Frequency Ordered Stop   11/14/16 1800  fluconazole (DIFLUCAN) tablet 200 mg     200 mg Per Tube Daily-1800 11/14/16 1019     11/10/16 1700  fluconazole (DIFLUCAN) IVPB 200 mg  Status:  Discontinued     200 mg 100 mL/hr over 60 Minutes Intravenous Every 24 hours 11/10/16 1610 11/14/16 1019   11/10/16 1608  fluconazole (DIFLUCAN) IVPB 200 mg  Status:  Discontinued     200 mg 100 mL/hr over 60 Minutes Intravenous  Once 11/10/16 1608 11/10/16 1610   10/29/16 1200  vancomycin (VANCOCIN) IVPB 1000 mg/200 mL premix  Status:  Discontinued     1,000 mg 200 mL/hr over 60 Minutes Intravenous Every 12 hours 10/29/16 0046 10/31/16 1140   10/29/16 0600  piperacillin-tazobactam (ZOSYN) IVPB 3.375 g     3.375 g 12.5 mL/hr over 240 Minutes Intravenous Every 8 hours 10/29/16 0046 11/05/16 0138   10/29/16 0100  vancomycin (VANCOCIN) 1,500 mg in sodium chloride 0.9 % 500 mL IVPB     1,500 mg 250 mL/hr over 120 Minutes Intravenous  Once 10/29/16 0046 10/29/16 0539   10/29/16 0100  piperacillin-tazobactam (ZOSYN) IVPB 3.375 g     3.375 g 100 mL/hr over 30 Minutes Intravenous  Once 10/29/16 0046 10/29/16 0410   10/29/16 0045  piperacillin-tazobactam (ZOSYN) IVPB 3.375 g  Status:  Discontinued     3.375 g 100 mL/hr over 30 Minutes Intravenous  Once 10/29/16 0034 10/29/16 0036   10/29/16 0045  vancomycin (VANCOCIN) IVPB 1000 mg/200 mL premix  Status:  Discontinued     1,000 mg 200 mL/hr over 60 Minutes Intravenous  Once 10/29/16 0034 10/29/16 0037   10/26/16 1000  amoxicillin-clavulanate (AUGMENTIN) 600-42.9 MG/5ML suspension 875 mg  Status:   Discontinued     875 mg Per Tube 2 times daily 10/26/16 0911 10/29/16 0034   10/25/16 1000  ceFEPIme (MAXIPIME) 1 g in dextrose 5 % 50 mL IVPB     1 g 100 mL/hr over 30 Minutes Intravenous Every 12 hours 10/25/16 0922 10/25/16 2345   10/20/16 2200  ceFEPIme (MAXIPIME) 1 g in dextrose 5 % 50 mL IVPB  Status:  Discontinued     1 g 100 mL/hr over 30 Minutes Intravenous Every 12 hours 10/20/16 1047 10/25/16 0922   10/19/16 2300  ceFEPIme (MAXIPIME) 1 g in dextrose 5 % 50 mL IVPB  Status:  Discontinued     1 g 100 mL/hr over 30 Minutes Intravenous Every 8 hours 10/19/16 2124 10/20/16 1047   10/19/16 2200  vancomycin (VANCOCIN) IVPB 750 mg/150 ml premix  Status:  Discontinued     750 mg 150 mL/hr over 60 Minutes Intravenous Every 12 hours 10/19/16 2124 10/20/16 1131   10/19/16 2115  vancomycin (  VANCOCIN) 1,500 mg in sodium chloride 0.9 % 500 mL IVPB  Status:  Discontinued     1,500 mg 250 mL/hr over 120 Minutes Intravenous  Once 10/19/16 2111 10/19/16 2117   10/19/16 2115  ceFEPIme (MAXIPIME) 2 g in dextrose 5 % 50 mL IVPB  Status:  Discontinued     2 g 100 mL/hr over 30 Minutes Intravenous  Once 10/19/16 2111 10/19/16 2120       Subjective: Non-verbal, unable to assess  Objective: Vitals:   11/15/16 0917 11/15/16 1049 11/15/16 1225 11/15/16 1418  BP:  (!) 155/68  136/62  Pulse: 97 (!) 103 84 83  Resp: 18 17 18 19   Temp:  99.8 F (37.7 C)  99.9 F (37.7 C)  TempSrc:  Axillary  Axillary  SpO2: 98% 98% 98% 98%  Weight:      Height:        Intake/Output Summary (Last 24 hours) at 11/15/16 1506 Last data filed at 11/15/16 0655  Gross per 24 hour  Intake                0 ml  Output             1750 ml  Net            -1750 ml   Filed Weights   11/12/16 0434 11/13/16 0452 11/13/16 1304  Weight: 67.1 kg (147 lb 14.9 oz) 68.8 kg (151 lb 10.8 oz) 71.8 kg (158 lb 4.6 oz)    Examination:  General exam: Appears calm and comfortable  Respiratory system: Clear to auscultation.  Respiratory effort normal. Cardiovascular system: S1 & S2 heard, RRR Gastrointestinal system: Abdomen is nondistended, soft and nontender. No organomegaly or masses felt. Normal bowel sounds heard. Central nervous system: Alert and oriented. No focal neurological deficits. Extremities: Symmetric 5 x 5 power. Skin: No rashes, lesions  Psychiatry: unable to assess as pt is non-verbal  Data Reviewed: I have personally reviewed following labs and imaging studies  CBC:  Recent Labs Lab 11/09/16 0302 11/10/16 0751 11/12/16 0232 11/13/16 0200 11/14/16 0259  WBC 22.1* 18.4* 17.8* 19.1* 16.8*  NEUTROABS  --  13.4* 12.8*  --   --   HGB 8.9* 7.7* 8.6* 8.5* 7.4*  HCT 28.0* 24.2* 27.0* 26.0* 22.9*  MCV 87.8 88.6 88.5 87.2 86.4  PLT 808* 791* 903* 852* 426*   Basic Metabolic Panel:  Recent Labs Lab 11/09/16 0302 11/10/16 0249 11/11/16 0331 11/12/16 0232 11/13/16 0200 11/14/16 0259  NA 140 139 138 136 133* 134*  K 4.5 4.1 4.0 3.8 4.2 3.8  CL 104 105 104 99* 101 102  CO2 26 25 26 26 24 25   GLUCOSE 80 88 95 109* 109* 104*  BUN 16 15 9 7 8 10   CREATININE 1.00 0.95 0.84 0.84 0.97 0.93  CALCIUM 9.8 9.5 9.3 9.6 9.6 9.7  MG 2.0 1.9 1.8 1.8 1.7  --   PHOS  --   --  3.6  --   --   --    GFR: Estimated Creatinine Clearance: 79.3 mL/min (by C-G formula based on SCr of 0.93 mg/dL). Liver Function Tests:  Recent Labs Lab 11/11/16 0331  AST 19  ALT 16*  ALKPHOS 86  BILITOT 0.7  PROT 6.6  ALBUMIN 1.6*   No results for input(s): LIPASE, AMYLASE in the last 168 hours. No results for input(s): AMMONIA in the last 168 hours. Coagulation Profile: No results for input(s): INR, PROTIME in the last 168 hours. Cardiac Enzymes:  No results for input(s): CKTOTAL, CKMB, CKMBINDEX, TROPONINI in the last 168 hours. BNP (last 3 results) No results for input(s): PROBNP in the last 8760 hours. HbA1C: No results for input(s): HGBA1C in the last 72 hours. CBG:  Recent Labs Lab 11/14/16 2008  11/14/16 2348 11/15/16 0405 11/15/16 0806 11/15/16 1154  GLUCAP 111* 130* 87 106* 108*   Lipid Profile: No results for input(s): CHOL, HDL, LDLCALC, TRIG, CHOLHDL, LDLDIRECT in the last 72 hours. Thyroid Function Tests: No results for input(s): TSH, T4TOTAL, FREET4, T3FREE, THYROIDAB in the last 72 hours. Anemia Panel:  Recent Labs  11/12/16 1847  VITAMINB12 976*  FOLATE 23.3  FERRITIN 1,808*  TIBC 169*  IRON 16*  RETICCTPCT 1.6   Sepsis Labs:  Recent Labs Lab 11/10/16 0751  LATICACIDVEN 0.8    Recent Results (from the past 240 hour(s))  Culture, Urine     Status: Abnormal   Collection Time: 11/06/16  3:32 PM  Result Value Ref Range Status   Specimen Description URINE, RANDOM  Final   Special Requests NONE  Final   Culture >=100,000 COLONIES/mL YEAST (A)  Final   Report Status 11/07/2016 FINAL  Final  Culture, blood (routine x 2)     Status: None   Collection Time: 11/06/16  4:14 PM  Result Value Ref Range Status   Specimen Description BLOOD RIGHT HAND  Final   Special Requests IN PEDIATRIC BOTTLE Blood Culture adequate volume  Final   Culture NO GROWTH 5 DAYS  Final   Report Status 11/11/2016 FINAL  Final  Culture, blood (routine x 2)     Status: None   Collection Time: 11/06/16  4:46 PM  Result Value Ref Range Status   Specimen Description BLOOD LEFT ARM  Final   Special Requests IN PEDIATRIC BOTTLE Blood Culture adequate volume  Final   Culture NO GROWTH 5 DAYS  Final   Report Status 11/11/2016 FINAL  Final  Culture, blood (routine x 2)     Status: None   Collection Time: 11/09/16 12:57 PM  Result Value Ref Range Status   Specimen Description BLOOD RIGHT ANTECUBITAL  Final   Special Requests IN PEDIATRIC BOTTLE Blood Culture adequate volume  Final   Culture NO GROWTH 5 DAYS  Final   Report Status 11/14/2016 FINAL  Final  Culture, blood (routine x 2)     Status: None   Collection Time: 11/09/16 12:57 PM  Result Value Ref Range Status   Specimen  Description BLOOD LEFT HAND  Final   Special Requests IN PEDIATRIC BOTTLE Blood Culture adequate volume  Final   Culture NO GROWTH 5 DAYS  Final   Report Status 11/14/2016 FINAL  Final  Culture, blood (routine x 2)     Status: None   Collection Time: 11/10/16  9:51 PM  Result Value Ref Range Status   Specimen Description BLOOD BLOOD LEFT HAND  Final   Special Requests   Final    BOTTLES DRAWN AEROBIC ONLY Blood Culture adequate volume   Culture NO GROWTH 5 DAYS  Final   Report Status 11/15/2016 FINAL  Final  Culture, blood (routine x 2)     Status: None   Collection Time: 11/10/16  9:59 PM  Result Value Ref Range Status   Specimen Description BLOOD BLOOD LEFT HAND  Final   Special Requests   Final    BOTTLES DRAWN AEROBIC AND ANAEROBIC Blood Culture results may not be optimal due to an excessive volume of blood received in culture bottles  Culture NO GROWTH 5 DAYS  Final   Report Status 11/15/2016 FINAL  Final     Radiology Studies: No results found.  Scheduled Meds: . amLODipine  10 mg Per Tube Daily  . aspirin  325 mg Per Tube Daily  . chlorhexidine gluconate (MEDLINE KIT)  15 mL Mouth Rinse BID  . cloNIDine  0.1 mg Per Tube BID  . clopidogrel  75 mg Per Tube Daily  . famotidine  20 mg Per Tube BID  . feeding supplement (JEVITY 1.5 CAL/FIBER)  1,000 mL Per Tube Q24H  . feeding supplement (PRO-STAT SUGAR FREE 64)  30 mL Per Tube TID  . fluconazole  200 mg Per Tube q1800  . heparin subcutaneous  5,000 Units Subcutaneous Q8H  . insulin aspart  0-9 Units Subcutaneous Q4H  . mouth rinse  15 mL Mouth Rinse QID  . metoCLOPramide  10 mg Per Tube TID AC & HS  . metoprolol tartrate  100 mg Per Tube BID  . multivitamin with minerals  1 tablet Per Tube Daily  . nicotine  14 mg Transdermal Daily  . pravastatin  20 mg Per Tube q1800  . thiamine  100 mg Per Tube Daily   Continuous Infusions:   LOS: 27 days   Etsuko Dierolf, Orpah Melter, MD [facility] Pager [phone]  If  7PM-7AM, please contact night-coverage [email or web address] Password [email or web address] 11/15/2016, 3:06 PM

## 2016-11-15 NOTE — Progress Notes (Signed)
Nutrition Follow-up  DOCUMENTATION CODES:   Severe malnutrition in context of acute illness/injury  INTERVENTION:    D/C Vital AF 1.2 formula  Initiate Jevity 1.5 formula at 10 ml/hr and increase by 10 ml every 8 hours to goal rate of 40 ml/hr   Prostat liquid protein 30 ml TID via tube  Total TF regimen to provide 1740 kcals, 106 gm protein, 729 ml of free water  NUTRITION DIAGNOSIS:   Malnutrition related to acute illness as evidenced by severe depletion of body fat, severe depletion of muscle mass, energy intake < or equal to 50% for > or equal to 5 days  Ongoing  GOAL:   Patient will meet greater than or equal to 90% of their needs   Progressing  MONITOR:   Labs, Weight trends, TF tolerance, Skin, I & O's  ASSESSMENT:   66 yo male with PMH of HTN, stroke who was admitted to [facility] on 7/1 after family found him on the floor confused. MRI showed multiple acute infarcts. He was transferred to [facility] on 7/5 at family's request.   PEG tube placed 7/13 Bedside tracheostomy 7/20  Vital AF 1.2 formula currently infusing at 30 ml/hr via PEG tube. Medications reviewed and include MVI, thiamine and Reglan. Free water flushes at 100 ml 4 times daily. Labs reviewed. CBG's [id number]. Palliative Care Team following.  Verbal with Read Back order received per Dr. Wyline Copas to change TF regimen.   Diet Order:  Diet - low sodium heart healthy Diet NPO time specified  Skin:  Wound (see comment) (stage II to R buttocks, L shoulder; DTI to heels)  Last BM:  7/31  Height:   Ht Readings from Last 1 Encounters:  11/07/16 5\' 11"  (1.803 m)   Weight:   Wt Readings from Last 1 Encounters:  11/13/16 158 lb 4.6 oz (71.8 kg)   Ideal Body Weight:  78.2 kg  BMI:  Body mass index is 22.08 kg/m.  Estimated Nutritional Needs:   Kcal:  1850-2050  Protein:  95-110 gm  Fluid:  1.8-2.0 L  EDUCATION NEEDS:   No education needs identified at this time  Arthur Holms, RD,  LDN Pager #: [phone] After-Hours Pager #: [phone]

## 2016-11-15 NOTE — Consult Note (Addendum)
[facility] Nurse wound re-consult note Reason for Consult: Consult requested for sacrum; pt previously was noted to have a stage 2 pressure injury upon admission; refer to [facility] consult note on 7/6.  This has healed and pt has lighter-colored scar tissue over sacrum; 1X1cm, no further open wound or drainage. Pt previously had deep tissue injuries to BLE upon admission; these have resolved and there are no wounds to bilat heels. Pt has a linear full thickness wound on forehead; .2X5cm, dry scabs remove easily, revealing pink moist skin.  Family at bedside to assess all locations. Dressing procedure/placement/frequency: Pt is on an air mattress for pressure reduction and bilat heels are elevated with Prevalon boots.  Foam dressings to sacrum and forehead to protect and promote healing. Please re-consult if further assistance is needed.  Thank-you,  Julien Girt MSN, [place], [place], [place], [place]

## 2016-11-16 ENCOUNTER — Inpatient Hospital Stay (HOSPITAL_COMMUNITY): Payer: Medicare HMO

## 2016-11-16 LAB — BASIC METABOLIC PANEL
Anion gap: 8 (ref 5–15)
BUN: 21 mg/dL — ABNORMAL HIGH (ref 6–20)
CO2: 25 mmol/L (ref 22–32)
Calcium: 10 mg/dL (ref 8.9–10.3)
Chloride: 104 mmol/L (ref 101–111)
Creatinine, Ser: 0.96 mg/dL (ref 0.61–1.24)
GFR calc Af Amer: >60 mL/min (ref >60)
GFR calc non Af Amer: >60 mL/min (ref >60)
Glucose, Bld: 115 mg/dL — ABNORMAL HIGH (ref 65–99)
Potassium: 4.3 mmol/L (ref 3.5–5.1)
Sodium: 137 mmol/L (ref 135–145)

## 2016-11-16 LAB — GLUCOSE, CAPILLARY
Glucose-Capillary: 105 mg/dL — ABNORMAL HIGH (ref 65–99)
Glucose-Capillary: 112 mg/dL — ABNORMAL HIGH (ref 65–99)
Glucose-Capillary: 114 mg/dL — ABNORMAL HIGH (ref 65–99)
Glucose-Capillary: 117 mg/dL — ABNORMAL HIGH (ref 65–99)
Glucose-Capillary: 138 mg/dL — ABNORMAL HIGH (ref 65–99)
Glucose-Capillary: 99 mg/dL (ref 65–99)

## 2016-11-16 LAB — CBC
HCT: 25.8 % — ABNORMAL LOW (ref 39.0–52.0)
Hemoglobin: 8 g/dL — ABNORMAL LOW (ref 13.0–17.0)
MCH: 27.1 pg (ref 26.0–34.0)
MCHC: 31 g/dL (ref 30.0–36.0)
MCV: 87.5 fL (ref 78.0–100.0)
Platelets: 953 10*3/uL (ref 150–400)
RBC: 2.95 MIL/uL — ABNORMAL LOW (ref 4.22–5.81)
RDW: 15.2 % (ref 11.5–15.5)
WBC: 17.3 10*3/uL — ABNORMAL HIGH (ref 4.0–10.5)

## 2016-11-16 MED ORDER — CHLORHEXIDINE GLUCONATE 0.12 % MT SOLN
OROMUCOSAL | Status: AC
  Filled 2016-11-16: qty 15

## 2016-11-16 NOTE — Progress Notes (Signed)
PROGRESS NOTE    JONAS GOH  ZOX:[id number] DOB: [birth year] DOA: 10/19/2016 PCP: Patient, No Pcp Per    Brief Narrative:  66 year-old male with past mental history of hypertension brought in by family to [facility] on 7/1 after being found down. At that time, MRI noted multiple acute infarcts involving both cerebellar hemispheres plus right occipital lobe, right midbrain and left thalamus. At time of admission, patient also noted to be in acute kidney injury plus rhabdomyolysis. Family requested transfer to [facility] and this was done on 7/5.  Patient's hospital course was noted for placement of PEG tube on 7/13 and then respiratory distress leading to intubation following aspiration event on 7/14. Patient extubated, but then had recurrent aspiration and reintubated on 7/18. Patient underwent tracheostomy on 7/20. Hospitalist resume care on 7/23 the patient overall has felt to be stable and moved up to the floor on 7/30.  No events overnight. Social work in process of finding a skilled nursing facility that accepts tracheostomy. Patient's mentation waxes and wanes along with alertness. Being followed by speech therapy. Currently not able to vocalize.  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[place]) Active Problems:   ARF (acute renal failure) (HCC)   Essential hypertension   Rhabdomyolysis   Acute ischemic stroke (HCC)   Pressure injury of skin   Dysphagia, post-stroke   Benign essential HTN   Tobacco abuse   Tachypnea   Hyperglycemia   Hypernatremia   Leukocytosis   Acute blood loss anemia   Chronic kidney disease   Septic shock (HCC)   Acute respiratory failure (HCC)   Aspiration into airway   Aspiration pneumonia (HCC)   Goals of care, counseling/discussion   Palliative care by specialist  Principal Problem: Severe sepsis w/ Extensive left lung Aspiration pneumonia with possible mucous plugging - recurrent aspiration  - S/p trach  7/20 - appears to have stabilized for now - remains high risk for recurrent aspiration - Patient continues with copious amounts of secretions - Have repeated and reviewed CXR. Appears stable. 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 noted on CTa - Neuro suggests "dual antiplatelet therapy for 3 months followed by Plavix alone" - will need extensive PT/OT and SLP - to consider loop recorder at outpt - TEE w/o evidence of embolic source - plan is for SNF placement  - remains stable at this time  Yeast in urine - Suspect this is not indicative of a pathogen - tx was initiated 7/27 - Patient to complete course of antimicrobial  Atrial septal aneurysm - TEE ruled out embolic source - remains stable at present  Sinus tachycardia - remains stable currently  Dysphagia - Pt has PEG tube in place - concerns about risk of ongoing aspiration -Patient is tolerating reglan  - Patient to continue tube feeds as tolerated  Ileus  Resolved as of 7/29 - continue with reglan as tolerated  HLD Cont tx - remains stable  HTN - BP remains stable. Vitals reviewed  Rhabdomyolysis-resolved with IV fluids - CK normalized  Acute renal failure - resolved  - Creatinine peaked at 2.6 - renal function normal. Cont to monitor urine output  History of alcoholism - Remains stable  Mild transaminits  - Unclear etiology - improved. Labs were reviewed  Tobacco abuse - Currently stable  Severe  malnutrition in context of acute illness - Will continue with tube feeding as tolerated  Normocytic anemia - Likely nutrition related - no evidence of blood loss - Hgb 7.4 on 3/31. Hgb up to 8.0 today - remains hemodynamically stable.   DVT prophylaxis: Heparin subQ Code Status: Full Family Communication: Pt in room, family at  bedside Disposition Plan: Anticipate SNF when amount of secretions has improved  Consultants:   Neurology  Critical care  Palliative Care   Procedures:   7/13 PEG tube inserted   7/14 to ICU after vomit w/ aspiration - intubated   7/16 LE venous duplex - no DVT  4/98 TEE - no embolic source - negative bubble study - normal EF  7/18 extubated >recurrent aspiration >re-intubated   7/18 bronchoscopy for culture  7/20 tracheostomy    Antimicrobials: Anti-infectives    Start     Dose/Rate Route Frequency Ordered Stop   11/14/16 1800  fluconazole (DIFLUCAN) tablet 200 mg     200 mg Per Tube Daily-1800 11/14/16 1019     11/10/16 1700  fluconazole (DIFLUCAN) IVPB 200 mg  Status:  Discontinued     200 mg 100 mL/hr over 60 Minutes Intravenous Every 24 hours 11/10/16 1610 11/14/16 1019   11/10/16 1608  fluconazole (DIFLUCAN) IVPB 200 mg  Status:  Discontinued     200 mg 100 mL/hr over 60 Minutes Intravenous  Once 11/10/16 1608 11/10/16 1610   10/29/16 1200  vancomycin (VANCOCIN) IVPB 1000 mg/200 mL premix  Status:  Discontinued     1,000 mg 200 mL/hr over 60 Minutes Intravenous Every 12 hours 10/29/16 0046 10/31/16 1140   10/29/16 0600  piperacillin-tazobactam (ZOSYN) IVPB 3.375 g     3.375 g 12.5 mL/hr over 240 Minutes Intravenous Every 8 hours 10/29/16 0046 11/05/16 0138   10/29/16 0100  vancomycin (VANCOCIN) 1,500 mg in sodium chloride 0.9 % 500 mL IVPB     1,500 mg 250 mL/hr over 120 Minutes Intravenous  Once 10/29/16 0046 10/29/16 0539   10/29/16 0100  piperacillin-tazobactam (ZOSYN) IVPB 3.375 g     3.375 g 100 mL/hr over 30 Minutes Intravenous  Once 10/29/16 0046 10/29/16 0410   10/29/16 0045  piperacillin-tazobactam (ZOSYN) IVPB 3.375 g  Status:  Discontinued     3.375 g 100 mL/hr over 30 Minutes Intravenous  Once 10/29/16 0034 10/29/16 0036   10/29/16 0045  vancomycin (VANCOCIN) IVPB 1000 mg/200 mL premix  Status:  Discontinued     1,000 mg 200 mL/hr over 60  Minutes Intravenous  Once 10/29/16 0034 10/29/16 0037   10/26/16 1000  amoxicillin-clavulanate (AUGMENTIN) 600-42.9 MG/5ML suspension 875 mg  Status:  Discontinued     875 mg Per Tube 2 times daily 10/26/16 0911 10/29/16 0034   10/25/16 1000  ceFEPIme (MAXIPIME) 1 g in dextrose 5 % 50 mL IVPB     1 g 100 mL/hr over 30 Minutes Intravenous Every 12 hours 10/25/16 0922 10/25/16 2345   10/20/16 2200  ceFEPIme (MAXIPIME) 1 g in dextrose 5 % 50 mL IVPB  Status:  Discontinued     1 g 100 mL/hr over 30 Minutes Intravenous Every 12 hours 10/20/16 1047 10/25/16 0922   10/19/16 2300  ceFEPIme (MAXIPIME) 1 g in dextrose 5 % 50 mL IVPB  Status:  Discontinued     1 g 100 mL/hr over 30 Minutes Intravenous Every 8 hours 10/19/16 2124 10/20/16 1047   10/19/16 2200  vancomycin (VANCOCIN) IVPB 750 mg/150 ml premix  Status:  Discontinued  750 mg 150 mL/hr over 60 Minutes Intravenous Every 12 hours 10/19/16 2124 10/20/16 1131   10/19/16 2115  vancomycin (VANCOCIN) 1,500 mg in sodium chloride 0.9 % 500 mL IVPB  Status:  Discontinued     1,500 mg 250 mL/hr over 120 Minutes Intravenous  Once 10/19/16 2111 10/19/16 2117   10/19/16 2115  ceFEPIme (MAXIPIME) 2 g in dextrose 5 % 50 mL IVPB  Status:  Discontinued     2 g 100 mL/hr over 30 Minutes Intravenous  Once 10/19/16 2111 10/19/16 2120      Subjective: Awake, non-verbal  Objective: Vitals:   11/16/16 0906 11/16/16 0923 11/16/16 1232 11/16/16 1439  BP: 131/69   (!) 140/56  Pulse: 98 (!) 104 79 88  Resp: 20 20 18 20   Temp: 98.9 F (37.2 C)   98.5 F (36.9 C)  TempSrc: Oral   Oral  SpO2: 98% 96% 98% 95%  Weight:      Height:        Intake/Output Summary (Last 24 hours) at 11/16/16 1526 Last data filed at 11/16/16 0600  Gross per 24 hour  Intake              740 ml  Output              450 ml  Net              290 ml   Filed Weights   11/12/16 0434 11/13/16 0452 11/13/16 1304  Weight: 67.1 kg (147 lb 14.9 oz) 68.8 kg (151 lb 10.8 oz) 71.8  kg (158 lb 4.6 oz)    Examination: General exam: Awake, laying in bed, in nad Respiratory system: Normal respiratory effort, no wheezing Cardiovascular system: regular rate, s1, s2 Gastrointestinal system: Soft, nondistended, positive BS Central nervous system: CN2-12 grossly intact, strength intact Extremities: Perfused, no clubbing Skin: Normal skin turgor, no notable skin lesions seen Psychiatry: unable to assess as pt is non-verbal   Data Reviewed: I have personally reviewed following labs and imaging studies  CBC:  Recent Labs Lab 11/10/16 0751 11/12/16 0232 11/13/16 0200 11/14/16 0259 11/16/16 0347  WBC 18.4* 17.8* 19.1* 16.8* 17.3*  NEUTROABS 13.4* 12.8*  --   --   --   HGB 7.7* 8.6* 8.5* 7.4* 8.0*  HCT 24.2* 27.0* 26.0* 22.9* 25.8*  MCV 88.6 88.5 87.2 86.4 87.5  PLT 791* 903* 852* 910* 032*   Basic Metabolic Panel:  Recent Labs Lab 11/10/16 0249 11/11/16 0331 11/12/16 0232 11/13/16 0200 11/14/16 0259 11/16/16 0347  NA 139 138 136 133* 134* 137  K 4.1 4.0 3.8 4.2 3.8 4.3  CL 105 104 99* 101 102 104  CO2 25 26 26 24 25 25   GLUCOSE 88 95 109* 109* 104* 115*  BUN 15 9 7 8 10  21*  CREATININE 0.95 0.84 0.84 0.97 0.93 0.96  CALCIUM 9.5 9.3 9.6 9.6 9.7 10.0  MG 1.9 1.8 1.8 1.7  --   --   PHOS  --  3.6  --   --   --   --    GFR: Estimated Creatinine Clearance: 76.9 mL/min (by C-G formula based on SCr of 0.96 mg/dL). Liver Function Tests:  Recent Labs Lab 11/11/16 0331  AST 19  ALT 16*  ALKPHOS 86  BILITOT 0.7  PROT 6.6  ALBUMIN 1.6*   No results for input(s): LIPASE, AMYLASE in the last 168 hours. No results for input(s): AMMONIA in the last 168 hours. Coagulation Profile: No results for input(s):  INR, PROTIME in the last 168 hours. Cardiac Enzymes: No results for input(s): CKTOTAL, CKMB, CKMBINDEX, TROPONINI in the last 168 hours. BNP (last 3 results) No results for input(s): PROBNP in the last 8760 hours. HbA1C: No results for input(s):  HGBA1C in the last 72 hours. CBG:  Recent Labs Lab 11/15/16 1956 11/15/16 2358 11/16/16 0355 11/16/16 0804 11/16/16 1140  GLUCAP 107* 99 117* 112* 114*   Lipid Profile: No results for input(s): CHOL, HDL, LDLCALC, TRIG, CHOLHDL, LDLDIRECT in the last 72 hours. Thyroid Function Tests: No results for input(s): TSH, T4TOTAL, FREET4, T3FREE, THYROIDAB in the last 72 hours. Anemia Panel: No results for input(s): VITAMINB12, FOLATE, FERRITIN, TIBC, IRON, RETICCTPCT in the last 72 hours. Sepsis Labs:  Recent Labs Lab 11/10/16 0751  LATICACIDVEN 0.8    Recent Results (from the past 240 hour(s))  Culture, Urine     Status: Abnormal   Collection Time: 11/06/16  3:32 PM  Result Value Ref Range Status   Specimen Description URINE, RANDOM  Final   Special Requests NONE  Final   Culture >=100,000 COLONIES/mL YEAST (A)  Final   Report Status 11/07/2016 FINAL  Final  Culture, blood (routine x 2)     Status: None   Collection Time: 11/06/16  4:14 PM  Result Value Ref Range Status   Specimen Description BLOOD RIGHT HAND  Final   Special Requests IN PEDIATRIC BOTTLE Blood Culture adequate volume  Final   Culture NO GROWTH 5 DAYS  Final   Report Status 11/11/2016 FINAL  Final  Culture, blood (routine x 2)     Status: None   Collection Time: 11/06/16  4:46 PM  Result Value Ref Range Status   Specimen Description BLOOD LEFT ARM  Final   Special Requests IN PEDIATRIC BOTTLE Blood Culture adequate volume  Final   Culture NO GROWTH 5 DAYS  Final   Report Status 11/11/2016 FINAL  Final  Culture, blood (routine x 2)     Status: None   Collection Time: 11/09/16 12:57 PM  Result Value Ref Range Status   Specimen Description BLOOD RIGHT ANTECUBITAL  Final   Special Requests IN PEDIATRIC BOTTLE Blood Culture adequate volume  Final   Culture NO GROWTH 5 DAYS  Final   Report Status 11/14/2016 FINAL  Final  Culture, blood (routine x 2)     Status: None   Collection Time: 11/09/16 12:57 PM    Result Value Ref Range Status   Specimen Description BLOOD LEFT HAND  Final   Special Requests IN PEDIATRIC BOTTLE Blood Culture adequate volume  Final   Culture NO GROWTH 5 DAYS  Final   Report Status 11/14/2016 FINAL  Final  Culture, blood (routine x 2)     Status: None   Collection Time: 11/10/16  9:51 PM  Result Value Ref Range Status   Specimen Description BLOOD BLOOD LEFT HAND  Final   Special Requests   Final    BOTTLES DRAWN AEROBIC ONLY Blood Culture adequate volume   Culture NO GROWTH 5 DAYS  Final   Report Status 11/15/2016 FINAL  Final  Culture, blood (routine x 2)     Status: None   Collection Time: 11/10/16  9:59 PM  Result Value Ref Range Status   Specimen Description BLOOD BLOOD LEFT HAND  Final   Special Requests   Final    BOTTLES DRAWN AEROBIC AND ANAEROBIC Blood Culture results may not be optimal due to an excessive volume of blood received in culture bottles  Culture NO GROWTH 5 DAYS  Final   Report Status 11/15/2016 FINAL  Final     Radiology Studies: Dg Chest Port 1 View  Result Date: 11/16/2016 CLINICAL DATA:  66 year old male with a history of pneumonia EXAM: PORTABLE CHEST 1 VIEW COMPARISON:  Multiple prior, most recent 11/13/2016, 11/07/2016 FINDINGS: Cardiomediastinal silhouette unchanged in size and contour. No evidence of central vascular congestion. Patchy opacities in the bilateral lower lungs, along the right heart border on the right and partially obscuring the left hemidiaphragm on the left. Overall, similar appearance to prior. No pneumothorax. Unchanged tracheostomy tube. No large pleural effusion identified. No displaced fracture IMPRESSION: Similar appearance of the lungs with basilar airspace opacities, potentially combination of persisting infection and atelectasis. Unchanged tracheostomy tube Electronically Signed   By: Corrie Mckusick D.O.   On: 11/16/2016 12:58    Scheduled Meds: . amLODipine  10 mg Per Tube Daily  . aspirin  325 mg Per Tube  Daily  . chlorhexidine gluconate (MEDLINE KIT)  15 mL Mouth Rinse BID  . cloNIDine  0.1 mg Per Tube BID  . clopidogrel  75 mg Per Tube Daily  . famotidine  20 mg Per Tube BID  . feeding supplement (JEVITY 1.5 CAL/FIBER)  1,000 mL Per Tube Q24H  . feeding supplement (PRO-STAT SUGAR FREE 64)  30 mL Per Tube TID  . fluconazole  200 mg Per Tube q1800  . heparin subcutaneous  5,000 Units Subcutaneous Q8H  . insulin aspart  0-9 Units Subcutaneous Q4H  . mouth rinse  15 mL Mouth Rinse QID  . metoCLOPramide  10 mg Per Tube TID AC & HS  . metoprolol tartrate  100 mg Per Tube BID  . multivitamin with minerals  1 tablet Per Tube Daily  . nicotine  14 mg Transdermal Daily  . pravastatin  20 mg Per Tube q1800  . thiamine  100 mg Per Tube Daily   Continuous Infusions:   LOS: 28 days   Erek Kowal, Orpah Melter, MD [facility] Pager [phone]  If 7PM-7AM, please contact night-coverage [email or web address] Password [email or web address] 11/16/2016, 3:26 PM

## 2016-11-16 NOTE — Progress Notes (Signed)
  Speech Language Pathology Treatment: Dysphagia;Passy Muir Speaking valve  Patient Details Name: Scott Rivera MRN: [id number] DOB: 05/02/1950 Today's Date: 11/16/2016 Time: 4970-2637 SLP Time Calculation (min) (ACUTE ONLY): 16 min  Assessment / Plan / Recommendation Clinical Impression  Pt is much more alert today and is attempting to mouth words, but he does not phonate despite Max cues from SLP and sister. His sister even called their mother in an attempt to use her as a therapeutic agent as well. He still seems to tolerate the PMV well with stable VS and no evidence of air trapping. Recommend continued use with full staff supervision.  Pt also consumed several trials of ice chips with multiple swallows and coughing elicited. He appears to have a significant dysphagia and has many risk factors for aspiration. Would remain NPO with continued use of PEG for now. He would likely benefit from instrumental testing prior to d/c to better assess oropharyngeal function.   HPI HPI: 66 y.o. male with history of hypertension was brought to [facility] on 10/15/2016 after patient was found to be confused. As per the family who provided most of the history patient was not able to be raised and family went to check on him at home and was found on the floor confused. Patient was taken to the ER. The last contact was 2 days ago by patient's brother prior to the episode. At [facility] MRI showed multiple acute infarcts involving the both hemisphere of the cerebellum and right occipital lobe right midbrain and left thalamus the fourth ventricle was patent no hydrocephalus. 2-D echo showing EF of 55-60% normal LV size. Carotid Doppler was done which showed right ICA 40-59% stenosis with left 60-79% stenosis. Chest x-ray was showing infiltrates and patient also was febrile for which patient was started on antibiotics. Patient's creatinine at admission was 2.6 which improved  to 1.5 the time of discharge. Patient also had rhabdomyolysis with CK of 7000 at the time of admission. As per the note patient was initially agitated requiring Haldol at the other hospital. Patient's mentation slowly improved gradually and family requested transfer to [facility] after discussing with neurologist at [facility] Dr. Leonel Ramsay      SLP Plan  Continue with current plan of care       Recommendations  Diet recommendations: NPO Medication Administration: Via alternative means      Patient may use Passy-Muir Speech Valve: During all therapies with supervision;Intermittently with supervision PMSV Supervision: Full MD: Please consider changing trach tube to : Cuffless         Oral Care Recommendations: Oral care QID Follow up Recommendations: Skilled Nursing facility SLP Visit Diagnosis: Dysphagia, unspecified (R13.10);[place]ph[place] (R49.1) Plan: Continue with current plan of care       GO                [name] 11/16/2016, 12:27 PM  [name], M.A. CCC-SLP [phone]

## 2016-11-16 NOTE — Care Management Important Message (Signed)
Important Message  Patient Details  Name: Scott Rivera MRN: [id number] Date of Birth: 1950/09/08   Medicare Important Message Given:  Yes    [name] 11/16/2016, 11:10 AM
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
## 2016-11-17 LAB — CBC
HCT: 24.5 % — ABNORMAL LOW (ref 39.0–52.0)
Hemoglobin: 7.7 g/dL — ABNORMAL LOW (ref 13.0–17.0)
MCH: 27.9 pg (ref 26.0–34.0)
MCHC: 31.4 g/dL (ref 30.0–36.0)
MCV: 88.8 fL (ref 78.0–100.0)
Platelets: 1007 10*3/uL (ref 150–400)
RBC: 2.76 MIL/uL — ABNORMAL LOW (ref 4.22–5.81)
RDW: 15.4 % (ref 11.5–15.5)
WBC: 18.4 10*3/uL — ABNORMAL HIGH (ref 4.0–10.5)

## 2016-11-17 LAB — GLUCOSE, CAPILLARY
Glucose-Capillary: 102 mg/dL — ABNORMAL HIGH (ref 65–99)
Glucose-Capillary: 104 mg/dL — ABNORMAL HIGH (ref 65–99)
Glucose-Capillary: 105 mg/dL — ABNORMAL HIGH (ref 65–99)
Glucose-Capillary: 112 mg/dL — ABNORMAL HIGH (ref 65–99)
Glucose-Capillary: 119 mg/dL — ABNORMAL HIGH (ref 65–99)
Glucose-Capillary: 128 mg/dL — ABNORMAL HIGH (ref 65–99)
Glucose-Capillary: 133 mg/dL — ABNORMAL HIGH (ref 65–99)

## 2016-11-17 LAB — BASIC METABOLIC PANEL
Anion gap: 10 (ref 5–15)
BUN: 28 mg/dL — ABNORMAL HIGH (ref 6–20)
CO2: 26 mmol/L (ref 22–32)
Calcium: 10.3 mg/dL (ref 8.9–10.3)
Chloride: 104 mmol/L (ref 101–111)
Creatinine, Ser: 0.97 mg/dL (ref 0.61–1.24)
GFR calc Af Amer: >60 mL/min (ref >60)
GFR calc non Af Amer: >60 mL/min (ref >60)
Glucose, Bld: 108 mg/dL — ABNORMAL HIGH (ref 65–99)
Potassium: 4.6 mmol/L (ref 3.5–5.1)
Sodium: 140 mmol/L (ref 135–145)

## 2016-11-17 MED ORDER — PIPERACILLIN-TAZOBACTAM 3.375 G IVPB
3.3750 g | Freq: Three times a day (TID) | INTRAVENOUS | Status: DC
  Administered 2016-11-17 – 2016-11-23 (×18): 3.375 g via INTRAVENOUS
  Filled 2016-11-17 (×20): qty 50

## 2016-11-17 NOTE — Progress Notes (Addendum)
Pharmacy Antibiotic Note  Scott Rivera is a 66 y.o. male starting Zoysn today for aspiration pneumonia.  Pharmacy assisted with antibiotic dosing earlier this admission for sepsis and HCAP.  Currently on day # 8 Fluconazole - yeast in urine culture 11/07/16.  Plan:  Zosyn 3.375 gm IV q8hrs (each over 4 hours).  Do not expect any need to adjust dose for renal function.  Will follow clinical progress, length of therapy and/or transition to oral antibiotics.  Add stop time to Fluconazole? 2 weeks?  Height: _0  (180.3 cm) Weight: 158 lb 4.6 oz (71.8 kg) IBW/kg (Calculated) : 75.3  Temp (24hrs), Avg:98.3 F (36.8 C), Min:98 F (36.7 C), Max:98.6 F (37 C)   Recent Labs Lab 11/12/16 0232 11/13/16 0200 11/14/16 0259 11/16/16 0347 11/17/16 0421  WBC 17.8* 19.1* 16.8* 17.3* 18.4*  CREATININE 0.84 0.97 0.93 0.96 0.97    Estimated Creatinine Clearance: 76.1 mL/min (by C-G formula based on SCr of 0.97 mg/dL).    No Known Allergies  Antimicrobials this admission: Vancomycin 7/5>>7/6; 7/15 >>7/17 for sepsis Cefepime 7/5>>7/11 Augmentin 7/12 >> 7/14 Fluconazole IV 7/27>>per tube 7/31>> Zosyn 7/15 >>7/22 for sepsis; resumed 8/3 for aspiration pna >>  Dose adjustments this admission:   N/a  Microbiology results: 7/6MRSA PCR: negative 7/15 respiratory - normal flora 7/15 blood x 2 - negative 7/18 BAL - normal flora 7/23 blood x 2 - negative 7/24 urine - yeast  7/26 blood x 2 - negative 7/27 blood x 2 - negative   Thank you for allowing pharmacy to be a part of this patient's care.  Arty Baumgartner, [place] Pager: [phone] 11/17/2016 3:02 PM

## 2016-11-17 NOTE — Care Management Important Message (Signed)
Important Message  Patient Details  Name: Scott Rivera MRN: [id number] Date of Birth: 03/04/1951   Medicare Important Message Given:  Yes    Aayliah Rotenberry [address] 11/17/2016, 12:59 PM

## 2016-11-17 NOTE — Progress Notes (Signed)
PROGRESS NOTE    Scott Rivera  JKK:[id number] DOB: 02/14/1951 DOA: 10/19/2016 PCP: Patient, No Pcp Per    Brief Narrative:  66 year-old male with past mental history of hypertension brought in by family to [facility] on 7/1 after being found down. At that time, MRI noted multiple acute infarcts involving both cerebellar hemispheres plus right occipital lobe, right midbrain and left thalamus. At time of admission, patient also noted to be in acute kidney injury plus rhabdomyolysis. Family requested transfer to [facility] and this was done on 7/5.  Patient's hospital course was noted for placement of PEG tube on 7/13 and then respiratory distress leading to intubation following aspiration event on 7/14. Patient extubated, but then had recurrent aspiration and reintubated on 7/18. Patient underwent tracheostomy on 7/20. Hospitalist resume care on 7/23 the patient overall has felt to be stable and moved up to the floor on 7/30.  No events overnight. Social work in process of finding a skilled nursing facility that accepts tracheostomy. Patient's mentation waxes and wanes along with alertness. Being followed by speech therapy. Currently not able to vocalize.  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[place]) Active Problems:   ARF (acute renal failure) (HCC)   Essential hypertension   Rhabdomyolysis   Acute ischemic stroke (HCC)   Pressure injury of skin   Dysphagia, post-stroke   Benign essential HTN   Tobacco abuse   Tachypnea   Hyperglycemia   Hypernatremia   Leukocytosis   Acute blood loss anemia   Chronic kidney disease   Septic shock (HCC)   Acute respiratory failure (HCC)   Aspiration into airway   Aspiration pneumonia (HCC)   Goals of care, counseling/discussion   Palliative care by specialist  Principal Problem: Severe sepsis w/ Extensive left lung Aspiration pneumonia with possible mucous plugging - recurrent aspiration  - S/p trach  7/20 - Appears to have stabilized for now - remains high risk for recurrent aspiration - Patient still with copious amounts of secretions - Have repeated and reviewed CXR. Appears stable. Continue to suction as tolerated - WBC trending up slightly. Given concerns of aspiration, will restart abx. 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 - Neuro suggests "dual antiplatelet therapy for 3 months followed by Plavix alone" - will need extensive PT/OT and SLP - to consider loop recorder at outpt - TEE w/o evidence of embolic source - plan is for SNF placement  - currently stable  Yeast in urine - Suspect this is not indicative of a pathogen - tx was initiated 7/27 - Patient to complete course of antimicrobial - Stable at present  Atrial septal aneurysm - TEE ruled out embolic source - presently stable  Sinus tachycardia - currently rate controlled  Dysphagia - Pt has PEG tube in place - concerns about risk of ongoing aspiration -Patient is tolerating reglan  - Patient is continued on tube feeds as tolerated  Ileus  Resolved as of 7/29 - continued on reglan as tolerated  HLD Cont tx - presently stable  HTN - BP currently stable. Vitals reviewed  Rhabdomyolysis-resolved with IV fluids - CK had normalized  Acute renal failure - resolved  - Creatinine peaked at 2.6 - renal function noted to be normal. Cont to monitor urine output  History of alcoholism -  Currently stable  Mild transaminits  - Unclear etiology - currently stable  Tobacco abuse - Currently stable  Severe malnutrition in context of acute illness - Will continue with tube feeding as tolerated  Normocytic anemia - Likely nutrition related - no evidence of blood loss - Hgb overall stable - Will repeat cbc in  AM  DVT prophylaxis: Heparin subQ Code Status: Full Family Communication: Pt in room, family at bedside Disposition Plan: Anticipate SNF when amount of secretions has improved  Consultants:   Neurology  Critical care  Palliative Care   Procedures:   7/13 PEG tube inserted   7/14 to ICU after vomit w/ aspiration - intubated   7/16 LE venous duplex - no DVT  8/93 TEE - no embolic source - negative bubble study - normal EF  7/18 extubated >recurrent aspiration >re-intubated   7/18 bronchoscopy for culture  7/20 tracheostomy    Antimicrobials: Anti-infectives    Start     Dose/Rate Route Frequency Ordered Stop   11/17/16 1200  piperacillin-tazobactam (ZOSYN) IVPB 3.375 g     3.375 g 12.5 mL/hr over 240 Minutes Intravenous Every 8 hours 11/17/16 1051     11/14/16 1800  fluconazole (DIFLUCAN) tablet 200 mg     200 mg Per Tube Daily-1800 11/14/16 1019     11/10/16 1700  fluconazole (DIFLUCAN) IVPB 200 mg  Status:  Discontinued     200 mg 100 mL/hr over 60 Minutes Intravenous Every 24 hours 11/10/16 1610 11/14/16 1019   11/10/16 1608  fluconazole (DIFLUCAN) IVPB 200 mg  Status:  Discontinued     200 mg 100 mL/hr over 60 Minutes Intravenous  Once 11/10/16 1608 11/10/16 1610   10/29/16 1200  vancomycin (VANCOCIN) IVPB 1000 mg/200 mL premix  Status:  Discontinued     1,000 mg 200 mL/hr over 60 Minutes Intravenous Every 12 hours 10/29/16 0046 10/31/16 1140   10/29/16 0600  piperacillin-tazobactam (ZOSYN) IVPB 3.375 g     3.375 g 12.5 mL/hr over 240 Minutes Intravenous Every 8 hours 10/29/16 0046 11/05/16 0138   10/29/16 0100  vancomycin (VANCOCIN) 1,500 mg in sodium chloride 0.9 % 500 mL IVPB     1,500 mg 250 mL/hr over 120 Minutes Intravenous  Once 10/29/16 0046 10/29/16 0539   10/29/16 0100  piperacillin-tazobactam (ZOSYN) IVPB 3.375 g     3.375 g 100 mL/hr over 30 Minutes Intravenous  Once 10/29/16 0046 10/29/16 0410   10/29/16 0045  piperacillin-tazobactam (ZOSYN)  IVPB 3.375 g  Status:  Discontinued     3.375 g 100 mL/hr over 30 Minutes Intravenous  Once 10/29/16 0034 10/29/16 0036   10/29/16 0045  vancomycin (VANCOCIN) IVPB 1000 mg/200 mL premix  Status:  Discontinued     1,000 mg 200 mL/hr over 60 Minutes Intravenous  Once 10/29/16 0034 10/29/16 0037   10/26/16 1000  amoxicillin-clavulanate (AUGMENTIN) 600-42.9 MG/5ML suspension 875 mg  Status:  Discontinued     875 mg Per Tube 2 times daily 10/26/16 0911 10/29/16 0034   10/25/16 1000  ceFEPIme (MAXIPIME) 1 g in dextrose 5 % 50 mL IVPB     1 g 100 mL/hr over 30 Minutes Intravenous Every 12 hours 10/25/16 0922 10/25/16 2345   10/20/16 2200  ceFEPIme (MAXIPIME) 1 g in dextrose 5 % 50 mL IVPB  Status:  Discontinued     1 g 100 mL/hr over 30 Minutes Intravenous Every 12 hours 10/20/16 1047 10/25/16 0922   10/19/16 2300  ceFEPIme (MAXIPIME) 1 g in dextrose 5 %  50 mL IVPB  Status:  Discontinued     1 g 100 mL/hr over 30 Minutes Intravenous Every 8 hours 10/19/16 2124 10/20/16 1047   10/19/16 2200  vancomycin (VANCOCIN) IVPB 750 mg/150 ml premix  Status:  Discontinued     750 mg 150 mL/hr over 60 Minutes Intravenous Every 12 hours 10/19/16 2124 10/20/16 1131   10/19/16 2115  vancomycin (VANCOCIN) 1,500 mg in sodium chloride 0.9 % 500 mL IVPB  Status:  Discontinued     1,500 mg 250 mL/hr over 120 Minutes Intravenous  Once 10/19/16 2111 10/19/16 2117   10/19/16 2115  ceFEPIme (MAXIPIME) 2 g in dextrose 5 % 50 mL IVPB  Status:  Discontinued     2 g 100 mL/hr over 30 Minutes Intravenous  Once 10/19/16 2111 10/19/16 2120      Subjective: Alert, not speaking  Objective: Vitals:   11/17/16 0944 11/17/16 1200 11/17/16 1300 11/17/16 1346  BP: (!) 155/60   127/63  Pulse: 100   79  Resp: 19   18  Temp: 98 F (36.7 C)   98.1 F (36.7 C)  TempSrc: Axillary   Axillary  SpO2: 97% 97% 97% 100%  Weight:      Height:        Intake/Output Summary (Last 24 hours) at 11/17/16 1542 Last data filed at  11/17/16 1100  Gross per 24 hour  Intake              120 ml  Output             1700 ml  Net            -1580 ml   Filed Weights   11/12/16 0434 11/13/16 0452 11/13/16 1304  Weight: 67.1 kg (147 lb 14.9 oz) 68.8 kg (151 lb 10.8 oz) 71.8 kg (158 lb 4.6 oz)    Examination: General exam: non-verbal, in no acute distress Respiratory system: normal chest rise, clear, no audible wheezing Cardiovascular system: regular rhythm, s1-s2 Gastrointestinal system: Nondistended, nontender, pos BS Central nervous system: No seizures, no tremors Extremities: No cyanosis, no joint deformities Skin: No rashes, no pallor Psychiatry: cannot obtain given current mental state     Data Reviewed: I have personally reviewed following labs and imaging studies  CBC:  Recent Labs Lab 11/12/16 0232 11/13/16 0200 11/14/16 0259 11/16/16 0347 11/17/16 0421  WBC 17.8* 19.1* 16.8* 17.3* 18.4*  NEUTROABS 12.8*  --   --   --   --   HGB 8.6* 8.5* 7.4* 8.0* 7.7*  HCT 27.0* 26.0* 22.9* 25.8* 24.5*  MCV 88.5 87.2 86.4 87.5 88.8  PLT 903* 852* 910* 953* 0,321*   Basic Metabolic Panel:  Recent Labs Lab 11/11/16 0331 11/12/16 0232 11/13/16 0200 11/14/16 0259 11/16/16 0347 11/17/16 0421  NA 138 136 133* 134* 137 140  K 4.0 3.8 4.2 3.8 4.3 4.6  CL 104 99* 101 102 104 104  CO2 _0 GLUCOSE 95 109* 109* 104* 115* 108*  BUN _1 21* 28*  CREATININE 0.84 0.84 0.97 0.93 0.96 0.97  CALCIUM 9.3 9.6 9.6 9.7 10.0 10.3  MG 1.8 1.8 1.7  --   --   --   PHOS 3.6  --   --   --   --   --    GFR: Estimated Creatinine Clearance: 76.1 mL/min (by C-G formula based on SCr of 0.97 mg/dL). Liver Function Tests:  Recent Labs Lab 11/11/16 0331  AST  19  ALT 16*  ALKPHOS 86  BILITOT 0.7  PROT 6.6  ALBUMIN 1.6*   No results for input(s): LIPASE, AMYLASE in the last 168 hours. No results for input(s): AMMONIA in the last 168 hours. Coagulation Profile: No results for input(s): INR, PROTIME in  the last 168 hours. Cardiac Enzymes: No results for input(s): CKTOTAL, CKMB, CKMBINDEX, TROPONINI in the last 168 hours. BNP (last 3 results) No results for input(s): PROBNP in the last 8760 hours. HbA1C: No results for input(s): HGBA1C in the last 72 hours. CBG:  Recent Labs Lab 11/16/16 2000 11/17/16 0019 11/17/16 0423 11/17/16 0750 11/17/16 1154  GLUCAP 138* 105* 112* 119* 133*   Lipid Profile: No results for input(s): CHOL, HDL, LDLCALC, TRIG, CHOLHDL, LDLDIRECT in the last 72 hours. Thyroid Function Tests: No results for input(s): TSH, T4TOTAL, FREET4, T3FREE, THYROIDAB in the last 72 hours. Anemia Panel: No results for input(s): VITAMINB12, FOLATE, FERRITIN, TIBC, IRON, RETICCTPCT in the last 72 hours. Sepsis Labs: No results for input(s): PROCALCITON, LATICACIDVEN in the last 168 hours.  Recent Results (from the past 240 hour(s))  Culture, blood (routine x 2)     Status: None   Collection Time: 11/09/16 12:57 PM  Result Value Ref Range Status   Specimen Description BLOOD RIGHT ANTECUBITAL  Final   Special Requests IN PEDIATRIC BOTTLE Blood Culture adequate volume  Final   Culture NO GROWTH 5 DAYS  Final   Report Status 11/14/2016 FINAL  Final  Culture, blood (routine x 2)     Status: None   Collection Time: 11/09/16 12:57 PM  Result Value Ref Range Status   Specimen Description BLOOD LEFT HAND  Final   Special Requests IN PEDIATRIC BOTTLE Blood Culture adequate volume  Final   Culture NO GROWTH 5 DAYS  Final   Report Status 11/14/2016 FINAL  Final  Culture, blood (routine x 2)     Status: None   Collection Time: 11/10/16  9:51 PM  Result Value Ref Range Status   Specimen Description BLOOD BLOOD LEFT HAND  Final   Special Requests   Final    BOTTLES DRAWN AEROBIC ONLY Blood Culture adequate volume   Culture NO GROWTH 5 DAYS  Final   Report Status 11/15/2016 FINAL  Final  Culture, blood (routine x 2)     Status: None   Collection Time: 11/10/16  9:59 PM   Result Value Ref Range Status   Specimen Description BLOOD BLOOD LEFT HAND  Final   Special Requests   Final    BOTTLES DRAWN AEROBIC AND ANAEROBIC Blood Culture results may not be optimal due to an excessive volume of blood received in culture bottles   Culture NO GROWTH 5 DAYS  Final   Report Status 11/15/2016 FINAL  Final     Radiology Studies: Dg Chest Port 1 View  Result Date: 11/16/2016 CLINICAL DATA:  66 year old male with a history of pneumonia EXAM: PORTABLE CHEST 1 VIEW COMPARISON:  Multiple prior, most recent 11/13/2016, 11/07/2016 FINDINGS: Cardiomediastinal silhouette unchanged in size and contour. No evidence of central vascular congestion. Patchy opacities in the bilateral lower lungs, along the right heart border on the right and partially obscuring the left hemidiaphragm on the left. Overall, similar appearance to prior. No pneumothorax. Unchanged tracheostomy tube. No large pleural effusion identified. No displaced fracture IMPRESSION: Similar appearance of the lungs with basilar airspace opacities, potentially combination of persisting infection and atelectasis. Unchanged tracheostomy tube Electronically Signed   By: Corrie Mckusick D.O.  On: 11/16/2016 12:58    Scheduled Meds: . amLODipine  10 mg Per Tube Daily  . aspirin  325 mg Per Tube Daily  . chlorhexidine gluconate (MEDLINE KIT)  15 mL Mouth Rinse BID  . cloNIDine  0.1 mg Per Tube BID  . clopidogrel  75 mg Per Tube Daily  . famotidine  20 mg Per Tube BID  . feeding supplement (JEVITY 1.5 CAL/FIBER)  1,000 mL Per Tube Q24H  . feeding supplement (PRO-STAT SUGAR FREE 64)  30 mL Per Tube TID  . fluconazole  200 mg Per Tube q1800  . heparin subcutaneous  5,000 Units Subcutaneous Q8H  . insulin aspart  0-9 Units Subcutaneous Q4H  . mouth rinse  15 mL Mouth Rinse QID  . metoCLOPramide  10 mg Per Tube TID AC & HS  . metoprolol tartrate  100 mg Per Tube BID  . multivitamin with minerals  1 tablet Per Tube Daily  .  nicotine  14 mg Transdermal Daily  . pravastatin  20 mg Per Tube q1800  . thiamine  100 mg Per Tube Daily   Continuous Infusions: . piperacillin-tazobactam (ZOSYN)  IV 3.375 g (11/17/16 1148)     LOS: 29 days   [place]oga Fogg, Orpah Melter, MD [facility] Pager [phone]  If 7PM-7AM, please contact night-coverage [email or web address] Password [facility] 11/17/2016, 3:42 PM

## 2016-11-17 NOTE — Progress Notes (Signed)
Pt with increased WBC. Plan is for SNF when medically ready. CM following.

## 2016-11-18 LAB — GLUCOSE, CAPILLARY
Glucose-Capillary: 108 mg/dL — ABNORMAL HIGH (ref 65–99)
Glucose-Capillary: 109 mg/dL — ABNORMAL HIGH (ref 65–99)
Glucose-Capillary: 118 mg/dL — ABNORMAL HIGH (ref 65–99)
Glucose-Capillary: 122 mg/dL — ABNORMAL HIGH (ref 65–99)
Glucose-Capillary: 137 mg/dL — ABNORMAL HIGH (ref 65–99)
Glucose-Capillary: 94 mg/dL (ref 65–99)

## 2016-11-18 LAB — CBC
HCT: 25.9 % — ABNORMAL LOW (ref 39.0–52.0)
Hemoglobin: 8.1 g/dL — ABNORMAL LOW (ref 13.0–17.0)
MCH: 28.3 pg (ref 26.0–34.0)
MCHC: 31.3 g/dL (ref 30.0–36.0)
MCV: 90.6 fL (ref 78.0–100.0)
Platelets: 1008 10*3/uL (ref 150–400)
RBC: 2.86 MIL/uL — ABNORMAL LOW (ref 4.22–5.81)
RDW: 15.6 % — ABNORMAL HIGH (ref 11.5–15.5)
WBC: 17.7 10*3/uL — ABNORMAL HIGH (ref 4.0–10.5)

## 2016-11-18 MED ORDER — BISACODYL 10 MG RE SUPP
10.0000 mg | Freq: Once | RECTAL | Status: AC
  Administered 2016-11-18: 10 mg via RECTAL
  Filled 2016-11-18: qty 1

## 2016-11-18 NOTE — Progress Notes (Signed)
PROGRESS NOTE    BESNIK FEBUS  TDV:[id number] DOB: [birth year] DOA: 10/19/2016 PCP: Patient, No Pcp Per    Brief Narrative:  66 year-old male with past mental history of hypertension brought in by family to [facility] on 7/1 after being found down. At that time, MRI noted multiple acute infarcts involving both cerebellar hemispheres plus right occipital lobe, right midbrain and left thalamus. At time of admission, patient also noted to be in acute kidney injury plus rhabdomyolysis. Family requested transfer to [facility] and this was done on 7/5.  Patient's hospital course was noted for placement of PEG tube on 7/13 and then respiratory distress leading to intubation following aspiration event on 7/14. Patient extubated, but then had recurrent aspiration and reintubated on 7/18. Patient underwent tracheostomy on 7/20. Hospitalist resume care on 7/23 the patient overall has felt to be stable and moved up to the floor on 7/30.  No events overnight. Social work in process of finding a skilled nursing facility that accepts tracheostomy. Patient's mentation waxes and wanes along with alertness. Being followed by speech therapy. Currently not able to vocalize.  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[place]) Active Problems:   ARF (acute renal failure) (HCC)   Essential hypertension   Rhabdomyolysis   Acute ischemic stroke (HCC)   Pressure injury of skin   Dysphagia, post-stroke   Benign essential HTN   Tobacco abuse   Tachypnea   Hyperglycemia   Hypernatremia   Leukocytosis   Acute blood loss anemia   Chronic kidney disease   Septic shock (HCC)   Acute respiratory failure (HCC)   Aspiration into airway   Aspiration pneumonia (HCC)   Goals of care, counseling/discussion   Palliative care by specialist  Principal Problem: Severe sepsis w/ Extensive left lung Aspiration pneumonia with possible mucous plugging - recurrent aspiration  - S/p trach  7/20 - Appears to have stabilized for now - remains high risk for recurrent aspiration - Patient still with copious amounts of secretions - WBC had recently trended up with increased secretions. 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 - Neuro suggests "dual antiplatelet therapy for 3 months followed by Plavix alone" - will need extensive PT/OT and SLP - to consider loop recorder at outpt - TEE w/o evidence of embolic source - plan is for SNF placement  - remains stable  Yeast in urine - Suspect this is not indicative of a pathogen - tx was initiated 7/27 - Patient to complete course of antimicrobial - Currently stable  Atrial septal aneurysm - TEE ruled out embolic source - remains stable  Sinus tachycardia - currently rate controlled - stable at present  Dysphagia - Pt has PEG tube in place - concerns about risk of ongoing aspiration -Patient is tolerating reglan  - Patient is continued on tube feeds as tolerated. Currently at target rate  Ileus  Noted to have been resolved as of 7/29 - continued on reglan as tolerated - No stool output per staff. Will give trial of dulcolax suppository   HLD Cont tx - remains stable  HTN - BP currently stable. Vitals were reviewed  Rhabdomyolysis-resolved with IV fluids - CK had normalized - Currently stable  Acute renal failure -  resolved  - Creatinine peaked at 2.6 - renal function noted to be normal. Stable at this time  History of alcoholism - remains stable without signs of withdrawals  Mild transaminits  - Unclear etiology - presently stable  Tobacco abuse - Currently stable  Severe malnutrition in context of acute illness - Will continue with  tube feeding as tolerated - Seen by Nutrition  Normocytic anemia - Likely nutrition related - no evidence of blood loss - Hgb appears stable, currently 8.1  DVT prophylaxis: Heparin subQ Code Status: Full Family Communication: Pt in room, family at bedside Disposition Plan: Anticipate SNF when amount of secretions has improved  Consultants:   Neurology  Critical care  Palliative Care   Procedures:   7/13 PEG tube inserted   7/14 to ICU after vomit w/ aspiration - intubated   7/16 LE venous duplex - no DVT  7/19 TEE - no embolic source - negative bubble study - normal EF  7/18 extubated >recurrent aspiration >re-intubated   7/18 bronchoscopy for culture  7/20 tracheostomy    Antimicrobials: Anti-infectives    Start     Dose/Rate Route Frequency Ordered Stop   11/17/16 1200  piperacillin-tazobactam (ZOSYN) IVPB 3.375 g     3.375 g 12.5 mL/hr over 240 Minutes Intravenous Every 8 hours 11/17/16 1051     11/14/16 1800  fluconazole (DIFLUCAN) tablet 200 mg     200 mg Per Tube Daily-1800 11/14/16 1019     11/10/16 1700  fluconazole (DIFLUCAN) IVPB 200 mg  Status:  Discontinued     200 mg 100 mL/hr over 60 Minutes Intravenous Every 24 hours 11/10/16 1610 11/14/16 1019   11/10/16 1608  fluconazole (DIFLUCAN) IVPB 200 mg  Status:  Discontinued     200 mg 100 mL/hr over 60 Minutes Intravenous  Once 11/10/16 1608 11/10/16 1610   10/29/16 1200  vancomycin (VANCOCIN) IVPB 1000 mg/200 mL premix  Status:  Discontinued     1,000 mg 200 mL/hr over 60 Minutes Intravenous Every 12 hours 10/29/16 0046 10/31/16 1140   10/29/16 0600  piperacillin-tazobactam (ZOSYN) IVPB 3.375 g     3.375 g 12.5 mL/hr over 240 Minutes Intravenous Every 8 hours 10/29/16 0046 11/05/16 0138   10/29/16 0100  vancomycin (VANCOCIN) 1,500 mg in sodium chloride 0.9 % 500 mL IVPB     1,500 mg 250 mL/hr over 120 Minutes Intravenous  Once 10/29/16 0046 10/29/16 0539   10/29/16 0100  piperacillin-tazobactam  (ZOSYN) IVPB 3.375 g     3.375 g 100 mL/hr over 30 Minutes Intravenous  Once 10/29/16 0046 10/29/16 0410   10/29/16 0045  piperacillin-tazobactam (ZOSYN) IVPB 3.375 g  Status:  Discontinued     3.375 g 100 mL/hr over 30 Minutes Intravenous  Once 10/29/16 0034 10/29/16 0036   10/29/16 0045  vancomycin (VANCOCIN) IVPB 1000 mg/200 mL premix  Status:  Discontinued     1,000 mg 200 mL/hr over 60 Minutes Intravenous  Once 10/29/16 0034 10/29/16 0037   10/26/16 1000  amoxicillin-clavulanate (AUGMENTIN) 600-42.9 MG/5ML suspension 875 mg  Status:  Discontinued     875 mg Per Tube 2 times daily 10/26/16 0911 10/29/16 0034   10/25/16 1000  ceFEPIme (MAXIPIME) 1 g in dextrose 5 % 50 mL IVPB     1 g 100 mL/hr over 30 Minutes Intravenous Every 12 hours 10/25/16 0922 10/25/16 2345   10/20/16 2200  ceFEPIme (MAXIPIME) 1 g in dextrose 5 % 50 mL IVPB  Status:  Discontinued     1  g 100 mL/hr over 30 Minutes Intravenous Every 12 hours 10/20/16 1047 10/25/16 0922   10/19/16 2300  ceFEPIme (MAXIPIME) 1 g in dextrose 5 % 50 mL IVPB  Status:  Discontinued     1 g 100 mL/hr over 30 Minutes Intravenous Every 8 hours 10/19/16 2124 10/20/16 1047   10/19/16 2200  vancomycin (VANCOCIN) IVPB 750 mg/150 ml premix  Status:  Discontinued     750 mg 150 mL/hr over 60 Minutes Intravenous Every 12 hours 10/19/16 2124 10/20/16 1131   10/19/16 2115  vancomycin (VANCOCIN) 1,500 mg in sodium chloride 0.9 % 500 mL IVPB  Status:  Discontinued     1,500 mg 250 mL/hr over 120 Minutes Intravenous  Once 10/19/16 2111 10/19/16 2117   10/19/16 2115  ceFEPIme (MAXIPIME) 2 g in dextrose 5 % 50 mL IVPB  Status:  Discontinued     2 g 100 mL/hr over 30 Minutes Intravenous  Once 10/19/16 2111 10/19/16 2120      Subjective: Awake, non-verbal  Objective: Vitals:   11/18/16 0852 11/18/16 1218 11/18/16 1446 11/18/16 1551  BP: 131/61  127/62   Pulse: 80 68 75 82  Resp: 12 16 18 18   Temp: 97.7 F (36.5 C)  97.7 F (36.5 C)     TempSrc:   Axillary   SpO2: 100% 99% 100% 100%  Weight:      Height:        Intake/Output Summary (Last 24 hours) at 11/18/16 1728 Last data filed at 11/18/16 1100  Gross per 24 hour  Intake               60 ml  Output             1500 ml  Net            -1440 ml   Filed Weights   11/12/16 0434 11/13/16 0452 11/13/16 1304  Weight: 67.1 kg (147 lb 14.9 oz) 68.8 kg (151 lb 10.8 oz) 71.8 kg (158 lb 4.6 oz)    Examination: General exam: Awake, laying in bed, in nad Respiratory system: Normal respiratory effort, no wheezing Cardiovascular system: regular rate, s1, s2 Gastrointestinal system: Soft, nondistended, positive BS Central nervous system: CN2-12 grossly intact, strength intact Extremities: Perfused, no clubbing Skin: Normal skin turgor, no notable skin lesions seen Psychiatry: cannot obtain secondary to being nonverbal  Data Reviewed: I have personally reviewed following labs and imaging studies  CBC:  Recent Labs Lab 11/12/16 0232 11/13/16 0200 11/14/16 0259 11/16/16 0347 11/17/16 0421 11/18/16 0355  WBC 17.8* 19.1* 16.8* 17.3* 18.4* 17.7*  NEUTROABS 12.8*  --   --   --   --   --   HGB 8.6* 8.5* 7.4* 8.0* 7.7* 8.1*  HCT 27.0* 26.0* 22.9* 25.8* 24.5* 25.9*  MCV 88.5 87.2 86.4 87.5 88.8 90.6  PLT 903* 852* 910* 953* 1,007* 7,048*   Basic Metabolic Panel:  Recent Labs Lab 11/12/16 0232 11/13/16 0200 11/14/16 0259 11/16/16 0347 11/17/16 0421  NA 136 133* 134* 137 140  K 3.8 4.2 3.8 4.3 4.6  CL 99* 101 102 104 104  CO2 26 24 25 25 26   GLUCOSE 109* 109* 104* 115* 108*  BUN 7 8 10  21* 28*  CREATININE 0.84 0.97 0.93 0.96 0.97  CALCIUM 9.6 9.6 9.7 10.0 10.3  MG 1.8 1.7  --   --   --    GFR: Estimated Creatinine Clearance: 76.1 mL/min (by C-G formula based on SCr of 0.97 mg/dL). Liver Function Tests:  No results for input(s): AST, ALT, ALKPHOS, BILITOT, PROT, ALBUMIN in the last 168 hours. No results for input(s): LIPASE, AMYLASE in the last 168  hours. No results for input(s): AMMONIA in the last 168 hours. Coagulation Profile: No results for input(s): INR, PROTIME in the last 168 hours. Cardiac Enzymes: No results for input(s): CKTOTAL, CKMB, CKMBINDEX, TROPONINI in the last 168 hours. BNP (last 3 results) No results for input(s): PROBNP in the last 8760 hours. HbA1C: No results for input(s): HGBA1C in the last 72 hours. CBG:  Recent Labs Lab 11/17/16 2344 11/18/16 0441 11/18/16 0719 11/18/16 1142 11/18/16 1707  GLUCAP 128* 122* 94 118* 108*   Lipid Profile: No results for input(s): CHOL, HDL, LDLCALC, TRIG, CHOLHDL, LDLDIRECT in the last 72 hours. Thyroid Function Tests: No results for input(s): TSH, T4TOTAL, FREET4, T3FREE, THYROIDAB in the last 72 hours. Anemia Panel: No results for input(s): VITAMINB12, FOLATE, FERRITIN, TIBC, IRON, RETICCTPCT in the last 72 hours. Sepsis Labs: No results for input(s): PROCALCITON, LATICACIDVEN in the last 168 hours.  Recent Results (from the past 240 hour(s))  Culture, blood (routine x 2)     Status: None   Collection Time: 11/09/16 12:57 PM  Result Value Ref Range Status   Specimen Description BLOOD RIGHT ANTECUBITAL  Final   Special Requests IN PEDIATRIC BOTTLE Blood Culture adequate volume  Final   Culture NO GROWTH 5 DAYS  Final   Report Status 11/14/2016 FINAL  Final  Culture, blood (routine x 2)     Status: None   Collection Time: 11/09/16 12:57 PM  Result Value Ref Range Status   Specimen Description BLOOD LEFT HAND  Final   Special Requests IN PEDIATRIC BOTTLE Blood Culture adequate volume  Final   Culture NO GROWTH 5 DAYS  Final   Report Status 11/14/2016 FINAL  Final  Culture, blood (routine x 2)     Status: None   Collection Time: 11/10/16  9:51 PM  Result Value Ref Range Status   Specimen Description BLOOD BLOOD LEFT HAND  Final   Special Requests   Final    BOTTLES DRAWN AEROBIC ONLY Blood Culture adequate volume   Culture NO GROWTH 5 DAYS  Final    Report Status 11/15/2016 FINAL  Final  Culture, blood (routine x 2)     Status: None   Collection Time: 11/10/16  9:59 PM  Result Value Ref Range Status   Specimen Description BLOOD BLOOD LEFT HAND  Final   Special Requests   Final    BOTTLES DRAWN AEROBIC AND ANAEROBIC Blood Culture results may not be optimal due to an excessive volume of blood received in culture bottles   Culture NO GROWTH 5 DAYS  Final   Report Status 11/15/2016 FINAL  Final     Radiology Studies: No results found.  Scheduled Meds: . amLODipine  10 mg Per Tube Daily  . aspirin  325 mg Per Tube Daily  . bisacodyl  10 mg Rectal Once  . chlorhexidine gluconate (MEDLINE KIT)  15 mL Mouth Rinse BID  . cloNIDine  0.1 mg Per Tube BID  . clopidogrel  75 mg Per Tube Daily  . famotidine  20 mg Per Tube BID  . feeding supplement (JEVITY 1.5 CAL/FIBER)  1,000 mL Per Tube Q24H  . feeding supplement (PRO-STAT SUGAR FREE 64)  30 mL Per Tube TID  . fluconazole  200 mg Per Tube q1800  . heparin subcutaneous  5,000 Units Subcutaneous Q8H  . insulin aspart  0-9 Units  Subcutaneous Q4H  . mouth rinse  15 mL Mouth Rinse QID  . metoCLOPramide  10 mg Per Tube TID AC & HS  . metoprolol tartrate  100 mg Per Tube BID  . multivitamin with minerals  1 tablet Per Tube Daily  . nicotine  14 mg Transdermal Daily  . pravastatin  20 mg Per Tube q1800  . thiamine  100 mg Per Tube Daily   Continuous Infusions: . piperacillin-tazobactam (ZOSYN)  IV Stopped (11/18/16 1610)     LOS: 30 days   Pascuala Klutts, Orpah Melter, MD [facility] Pager [phone]  If 7PM-7AM, please contact night-coverage [email or web address] Password [email or web address] 11/18/2016, 5:28 PM

## 2016-11-19 LAB — GLUCOSE, CAPILLARY
Glucose-Capillary: 112 mg/dL — ABNORMAL HIGH (ref 65–99)
Glucose-Capillary: 125 mg/dL — ABNORMAL HIGH (ref 65–99)
Glucose-Capillary: 93 mg/dL (ref 65–99)
Glucose-Capillary: 124 mg/dL — ABNORMAL HIGH (ref 65–99)
Glucose-Capillary: 109 mg/dL — ABNORMAL HIGH (ref 65–99)

## 2016-11-19 LAB — CBC
HCT: 26.2 % — ABNORMAL LOW (ref 39.0–52.0)
Hemoglobin: 8.2 g/dL — ABNORMAL LOW (ref 13.0–17.0)
MCH: 28.1 pg (ref 26.0–34.0)
MCHC: 31.3 g/dL (ref 30.0–36.0)
MCV: 89.7 fL (ref 78.0–100.0)
Platelets: 1152 10*3/uL (ref 150–400)
RBC: 2.92 MIL/uL — ABNORMAL LOW (ref 4.22–5.81)
RDW: 15.2 % (ref 11.5–15.5)
WBC: 22.4 10*3/uL — ABNORMAL HIGH (ref 4.0–10.5)

## 2016-11-19 NOTE — Progress Notes (Signed)
PROGRESS NOTE    Scott Rivera  DJS:[id number] DOB: 05-27-1950 DOA: 10/19/2016 PCP: Patient, No Pcp Per    Brief Narrative:  66 year-old male with past mental history of hypertension brought in by family to [facility] on 7/1 after being found down. At that time, MRI noted multiple acute infarcts involving both cerebellar hemispheres plus right occipital lobe, right midbrain and left thalamus. At time of admission, patient also noted to be in acute kidney injury plus rhabdomyolysis. Family requested transfer to [facility] and this was done on 7/5.  Patient's hospital course was noted for placement of PEG tube on 7/13 and then respiratory distress leading to intubation following aspiration event on 7/14. Patient extubated, but then had recurrent aspiration and reintubated on 7/18. Patient underwent tracheostomy on 7/20. Hospitalist resume care on 7/23 the patient overall has felt to be stable and moved up to the floor on 7/30.  No events overnight. Social work in process of finding a skilled nursing facility that accepts tracheostomy. Patient's mentation waxes and wanes along with alertness. Being followed by speech therapy. Currently not able to vocalize.  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[place]) Active Problems:   ARF (acute renal failure) (HCC)   Essential hypertension   Rhabdomyolysis   Acute ischemic stroke (HCC)   Pressure injury of skin   Dysphagia, post-stroke   Benign essential HTN   Tobacco abuse   Tachypnea   Hyperglycemia   Hypernatremia   Leukocytosis   Acute blood loss anemia   Chronic kidney disease   Septic shock (HCC)   Acute respiratory failure (HCC)   Aspiration into airway   Aspiration pneumonia (HCC)   Goals of care, counseling/discussion   Palliative care by specialist  Principal Problem: Severe sepsis w/ Extensive left lung Aspiration pneumonia with possible mucous plugging - recurrent aspiration  - S/p trach  7/20 - Appears to have stabilized for now - remains high risk for recurrent aspiration - Patient still with copious amounts of secretions - WBC had recently trended up with increased secretions. CXR demonstrated persistent PNA and pt was restarted on zosyn - Since starting abx, patient's secretions noted to have improved - WBC increased to 22k today. Afebrile. Will continue current IV abx. 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 - Neuro suggests "dual antiplatelet therapy for 3 months followed by Plavix alone" - will need extensive PT/OT and SLP - to consider loop recorder at outpt - TEE w/o evidence of embolic source - plan is for SNF placement  - currently stable  Yeast in urine - Suspect this is not indicative of a pathogen - tx was initiated 7/27 - Patient to complete course of antimicrobial - remains stable  Atrial septal aneurysm - TEE ruled out embolic source - currently stable  Sinus tachycardia - currently rate controlled - presently stable  Dysphagia - Pt has PEG tube in place - concerns about risk of ongoing aspiration -Patient is tolerating reglan  - Patient to continue tube feeds as tolerated. Stable  Ileus  - Noted to have been resolved as of 7/29 - continued on reglan as tolerated - Good result with bowel regimen  HLD Cont tx - currently stable  HTN - BP currently stable at this time  Rhabdomyolysis-resolved with IV fluids - CK had normalized -  Remains stable  Acute renal failure - resolved  - Creatinine peaked at 2.6 - renal function currently stable  History of alcoholism - remains stable without signs of withdrawals - Stable at present  Mild transaminits  - Unclear etiology - remains stable  Tobacco abuse - Remains stable  Severe malnutrition in context of acute  illness - Will continue with tube feeding as tolerated - Patient was earlier seen by Nutrition  Normocytic anemia - Likely nutrition related - no evidence of blood loss - Hgb remains stable at 8.2  DVT prophylaxis: Heparin subQ Code Status: Full Family Communication: Pt in room, family at bedside Disposition Plan: Anticipate SNF when amount of secretions has improved  Consultants:   Neurology  Critical care  Palliative Care   Procedures:   7/13 PEG tube inserted   7/14 to ICU after vomit w/ aspiration - intubated   7/16 LE venous duplex - no DVT  1/91 TEE - no embolic source - negative bubble study - normal EF  7/18 extubated >recurrent aspiration >re-intubated   7/18 bronchoscopy for culture  7/20 tracheostomy    Antimicrobials: Anti-infectives    Start     Dose/Rate Route Frequency Ordered Stop   11/17/16 1200  piperacillin-tazobactam (ZOSYN) IVPB 3.375 g     3.375 g 12.5 mL/hr over 240 Minutes Intravenous Every 8 hours 11/17/16 1051     11/14/16 1800  fluconazole (DIFLUCAN) tablet 200 mg     200 mg Per Tube Daily-1800 11/14/16 1019     11/10/16 1700  fluconazole (DIFLUCAN) IVPB 200 mg  Status:  Discontinued     200 mg 100 mL/hr over 60 Minutes Intravenous Every 24 hours 11/10/16 1610 11/14/16 1019   11/10/16 1608  fluconazole (DIFLUCAN) IVPB 200 mg  Status:  Discontinued     200 mg 100 mL/hr over 60 Minutes Intravenous  Once 11/10/16 1608 11/10/16 1610   10/29/16 1200  vancomycin (VANCOCIN) IVPB 1000 mg/200 mL premix  Status:  Discontinued     1,000 mg 200 mL/hr over 60 Minutes Intravenous Every 12 hours 10/29/16 0046 10/31/16 1140   10/29/16 0600  piperacillin-tazobactam (ZOSYN) IVPB 3.375 g     3.375 g 12.5 mL/hr over 240 Minutes Intravenous Every 8 hours 10/29/16 0046 11/05/16 0138   10/29/16 0100  vancomycin (VANCOCIN) 1,500 mg in sodium chloride 0.9 % 500 mL IVPB     1,500 mg 250 mL/hr over 120 Minutes Intravenous  Once 10/29/16 0046 10/29/16 0539    10/29/16 0100  piperacillin-tazobactam (ZOSYN) IVPB 3.375 g     3.375 g 100 mL/hr over 30 Minutes Intravenous  Once 10/29/16 0046 10/29/16 0410   10/29/16 0045  piperacillin-tazobactam (ZOSYN) IVPB 3.375 g  Status:  Discontinued     3.375 g 100 mL/hr over 30 Minutes Intravenous  Once 10/29/16 0034 10/29/16 0036   10/29/16 0045  vancomycin (VANCOCIN) IVPB 1000 mg/200 mL premix  Status:  Discontinued     1,000 mg 200 mL/hr over 60 Minutes Intravenous  Once 10/29/16 0034 10/29/16 0037   10/26/16 1000  amoxicillin-clavulanate (AUGMENTIN) 600-42.9 MG/5ML suspension 875 mg  Status:  Discontinued     875 mg Per Tube 2 times daily 10/26/16 0911 10/29/16 0034   10/25/16 1000  ceFEPIme (MAXIPIME) 1 g in dextrose 5 % 50 mL IVPB     1 g 100 mL/hr over 30 Minutes Intravenous Every 12 hours 10/25/16 0922 10/25/16 2345   10/20/16 2200  ceFEPIme (MAXIPIME) 1 g in dextrose 5 % 50 mL IVPB  Status:  Discontinued     1 g 100 mL/hr over 30 Minutes Intravenous Every 12 hours 10/20/16 1047 10/25/16 0922   10/19/16 2300  ceFEPIme (MAXIPIME) 1 g in dextrose 5 % 50 mL IVPB  Status:  Discontinued     1 g 100 mL/hr over 30 Minutes Intravenous Every 8 hours 10/19/16 2124 10/20/16 1047   10/19/16 2200  vancomycin (VANCOCIN) IVPB 750 mg/150 ml premix  Status:  Discontinued     750 mg 150 mL/hr over 60 Minutes Intravenous Every 12 hours 10/19/16 2124 10/20/16 1131   10/19/16 2115  vancomycin (VANCOCIN) 1,500 mg in sodium chloride 0.9 % 500 mL IVPB  Status:  Discontinued     1,500 mg 250 mL/hr over 120 Minutes Intravenous  Once 10/19/16 2111 10/19/16 2117   10/19/16 2115  ceFEPIme (MAXIPIME) 2 g in dextrose 5 % 50 mL IVPB  Status:  Discontinued     2 g 100 mL/hr over 30 Minutes Intravenous  Once 10/19/16 2111 10/19/16 2120      Subjective: Non-verbal, however nods no when asked about abd pain or sob  Objective: Vitals:   11/19/16 0851 11/19/16 1220 11/19/16 1436 11/19/16 1550  BP:   138/78   Pulse: (!) 101  86 95 95  Resp:  (!) 22 17 18   Temp:   98 F (36.7 C)   TempSrc:   Axillary   SpO2:  100% 97% 97%  Weight:      Height:        Intake/Output Summary (Last 24 hours) at 11/19/16 1651 Last data filed at 11/19/16 1645  Gross per 24 hour  Intake              170 ml  Output             2625 ml  Net            -2455 ml   Filed Weights   11/12/16 0434 11/13/16 0452 11/13/16 1304  Weight: 67.1 kg (147 lb 14.9 oz) 68.8 kg (151 lb 10.8 oz) 71.8 kg (158 lb 4.6 oz)    Examination: General exam: awake, in no acute distress Respiratory system: normal chest rise, clear, no audible wheezing Cardiovascular system: regular rhythm, s1-s2 Gastrointestinal system: Nondistended, nontender, pos BS Central nervous system: No seizures, no tremors Extremities: No cyanosis, no joint deformities Skin: No rashes, no pallor Psychiatry: difficult to assess as pt is nonverbal   Data Reviewed: I have personally reviewed following labs and imaging studies  CBC:  Recent Labs Lab 11/14/16 0259 11/16/16 0347 11/17/16 0421 11/18/16 0355 11/19/16 0411  WBC 16.8* 17.3* 18.4* 17.7* 22.4*  HGB 7.4* 8.0* 7.7* 8.1* 8.2*  HCT 22.9* 25.8* 24.5* 25.9* 26.2*  MCV 86.4 87.5 88.8 90.6 89.7  PLT 910* 953* 1,007* 1,008* 0,623*   Basic Metabolic Panel:  Recent Labs Lab 11/13/16 0200 11/14/16 0259 11/16/16 0347 11/17/16 0421  NA 133* 134* 137 140  K 4.2 3.8 4.3 4.6  CL 101 102 104 104  CO2 24 25 25 26   GLUCOSE 109* 104* 115* 108*  BUN 8 10 21* 28*  CREATININE 0.97 0.93 0.96 0.97  CALCIUM 9.6 9.7 10.0 10.3  MG 1.7  --   --   --    GFR: Estimated Creatinine Clearance: 76.1 mL/min (by C-G formula based on SCr of 0.97 mg/dL). Liver Function Tests: No results for input(s): AST, ALT, ALKPHOS, BILITOT, PROT, ALBUMIN in the last 168 hours. No results for input(s): LIPASE, AMYLASE in the  last 168 hours. No results for input(s): AMMONIA in the last 168 hours. Coagulation Profile: No results for input(s):  INR, PROTIME in the last 168 hours. Cardiac Enzymes: No results for input(s): CKTOTAL, CKMB, CKMBINDEX, TROPONINI in the last 168 hours. BNP (last 3 results) No results for input(s): PROBNP in the last 8760 hours. HbA1C: No results for input(s): HGBA1C in the last 72 hours. CBG:  Recent Labs Lab 11/18/16 2358 11/19/16 0435 11/19/16 0825 11/19/16 1150 11/19/16 1643  GLUCAP 137* 112* 125* 93 124*   Lipid Profile: No results for input(s): CHOL, HDL, LDLCALC, TRIG, CHOLHDL, LDLDIRECT in the last 72 hours. Thyroid Function Tests: No results for input(s): TSH, T4TOTAL, FREET4, T3FREE, THYROIDAB in the last 72 hours. Anemia Panel: No results for input(s): VITAMINB12, FOLATE, FERRITIN, TIBC, IRON, RETICCTPCT in the last 72 hours. Sepsis Labs: No results for input(s): PROCALCITON, LATICACIDVEN in the last 168 hours.  Recent Results (from the past 240 hour(s))  Culture, blood (routine x 2)     Status: None   Collection Time: 11/10/16  9:51 PM  Result Value Ref Range Status   Specimen Description BLOOD BLOOD LEFT HAND  Final   Special Requests   Final    BOTTLES DRAWN AEROBIC ONLY Blood Culture adequate volume   Culture NO GROWTH 5 DAYS  Final   Report Status 11/15/2016 FINAL  Final  Culture, blood (routine x 2)     Status: None   Collection Time: 11/10/16  9:59 PM  Result Value Ref Range Status   Specimen Description BLOOD BLOOD LEFT HAND  Final   Special Requests   Final    BOTTLES DRAWN AEROBIC AND ANAEROBIC Blood Culture results may not be optimal due to an excessive volume of blood received in culture bottles   Culture NO GROWTH 5 DAYS  Final   Report Status 11/15/2016 FINAL  Final     Radiology Studies: No results found.  Scheduled Meds: . amLODipine  10 mg Per Tube Daily  . aspirin  325 mg Per Tube Daily  . chlorhexidine gluconate (MEDLINE KIT)  15 mL Mouth Rinse BID  . cloNIDine  0.1 mg Per Tube BID  . clopidogrel  75 mg Per Tube Daily  . famotidine  20 mg Per Tube  BID  . feeding supplement (JEVITY 1.5 CAL/FIBER)  1,000 mL Per Tube Q24H  . feeding supplement (PRO-STAT SUGAR FREE 64)  30 mL Per Tube TID  . fluconazole  200 mg Per Tube q1800  . heparin subcutaneous  5,000 Units Subcutaneous Q8H  . insulin aspart  0-9 Units Subcutaneous Q4H  . mouth rinse  15 mL Mouth Rinse QID  . metoCLOPramide  10 mg Per Tube TID AC & HS  . metoprolol tartrate  100 mg Per Tube BID  . multivitamin with minerals  1 tablet Per Tube Daily  . nicotine  14 mg Transdermal Daily  . pravastatin  20 mg Per Tube q1800  . thiamine  100 mg Per Tube Daily   Continuous Infusions: . piperacillin-tazobactam (ZOSYN)  IV 3.375 g (11/19/16 1247)     LOS: 31 days   Areyanna Figeroa, Orpah Melter, MD [facility] Pager [phone]  If 7PM-7AM, please contact night-coverage [email or web address] Password [email or web address] 11/19/2016, 4:51 PM

## 2016-11-20 ENCOUNTER — Inpatient Hospital Stay (HOSPITAL_COMMUNITY): Payer: Medicare HMO

## 2016-11-20 LAB — GLUCOSE, CAPILLARY
Glucose-Capillary: 114 mg/dL — ABNORMAL HIGH (ref 65–99)
Glucose-Capillary: 120 mg/dL — ABNORMAL HIGH (ref 65–99)
Glucose-Capillary: 126 mg/dL — ABNORMAL HIGH (ref 65–99)
Glucose-Capillary: 131 mg/dL — ABNORMAL HIGH (ref 65–99)
Glucose-Capillary: 139 mg/dL — ABNORMAL HIGH (ref 65–99)
Glucose-Capillary: 153 mg/dL — ABNORMAL HIGH (ref 65–99)

## 2016-11-20 LAB — BASIC METABOLIC PANEL
Anion gap: 12 (ref 5–15)
BUN: 40 mg/dL — ABNORMAL HIGH (ref 6–20)
CO2: 27 mmol/L (ref 22–32)
Calcium: 10.4 mg/dL — ABNORMAL HIGH (ref 8.9–10.3)
Chloride: 106 mmol/L (ref 101–111)
Creatinine, Ser: 1.41 mg/dL — ABNORMAL HIGH (ref 0.61–1.24)
GFR calc Af Amer: 58 mL/min — ABNORMAL LOW (ref >60)
GFR calc non Af Amer: 50 mL/min — ABNORMAL LOW (ref >60)
Glucose, Bld: 127 mg/dL — ABNORMAL HIGH (ref 65–99)
Potassium: 4.6 mmol/L (ref 3.5–5.1)
Sodium: 145 mmol/L (ref 135–145)

## 2016-11-20 LAB — CBC
HCT: 25.5 % — ABNORMAL LOW (ref 39.0–52.0)
Hemoglobin: 8.1 g/dL — ABNORMAL LOW (ref 13.0–17.0)
MCH: 28.6 pg (ref 26.0–34.0)
MCHC: 31.8 g/dL (ref 30.0–36.0)
MCV: 90.1 fL (ref 78.0–100.0)
Platelets: 1097 10*3/uL (ref 150–400)
RBC: 2.83 MIL/uL — ABNORMAL LOW (ref 4.22–5.81)
RDW: 15.2 % (ref 11.5–15.5)
WBC: 23.5 10*3/uL — ABNORMAL HIGH (ref 4.0–10.5)

## 2016-11-20 MED ORDER — PEG 3350-KCL-NA BICARB-NACL 420 G PO SOLR
4000.0000 mL | Freq: Once | ORAL | Status: AC
  Administered 2016-11-20: 4000 mL
  Filled 2016-11-20: qty 4000

## 2016-11-20 MED ORDER — SODIUM CHLORIDE 0.9 % IV SOLN
INTRAVENOUS | Status: DC
  Administered 2016-11-22: 20:00:00 via INTRAVENOUS

## 2016-11-20 NOTE — Care Management Note (Signed)
Case Management Note  Patient Details  Name: CHASKA HAGGER MRN: [id number] Date of Birth: Mar 13, 1951  Subjective/Objective:                    Action/Plan: Pt with elevated WBC's. Plan is for SNF when medically ready. CM following.   Expected Discharge Date:  10/25/16               Expected Discharge Plan:  Skilled Nursing Facility  In-House Referral:  Clinical Social Work  Discharge planning Services  CM Consult  Post Acute Care Choice:  IP Rehab Choice offered to:     DME Arranged:    DME Agency:     HH Arranged:    [place] Agency:     Status of Service:  In process, will continue to follow  If discussed at Long Length of Stay Meetings, dates discussed:    Additional Comments:  Pollie Friar, RN 11/20/2016, 11:33 AM

## 2016-11-20 NOTE — Progress Notes (Signed)
PROGRESS NOTE    Scott Rivera  LAG:[id number] DOB: 03/07/1951 DOA: 10/19/2016 PCP: Patient, No Pcp Per    Brief Narrative:  66 year-old male with past mental history of hypertension brought in by family to [facility] on 7/1 after being found down. At that time, MRI noted multiple acute infarcts involving both cerebellar hemispheres plus right occipital lobe, right midbrain and left thalamus. At time of admission, patient also noted to be in acute kidney injury plus rhabdomyolysis. Family requested transfer to [facility] and this was done on 7/5.  Patient's hospital course was noted for placement of PEG tube on 7/13 and then respiratory distress leading to intubation following aspiration event on 7/14. Patient extubated, but then had recurrent aspiration and reintubated on 7/18. Patient underwent tracheostomy on 7/20. Hospitalist resume care on 7/23 the patient overall has felt to be stable and moved up to the floor on 7/30.  No events overnight. Social work in process of finding a skilled nursing facility that accepts tracheostomy. Patient's mentation waxes and wanes along with alertness. Being followed by speech therapy. Currently not able to vocalize.  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[place]) Active Problems:   ARF (acute renal failure) (HCC)   Essential hypertension   Rhabdomyolysis   Acute ischemic stroke (HCC)   Pressure injury of skin   Dysphagia, post-stroke   Benign essential HTN   Tobacco abuse   Tachypnea   Hyperglycemia   Hypernatremia   Leukocytosis   Acute blood loss anemia   Chronic kidney disease   Septic shock (HCC)   Acute respiratory failure (HCC)   Aspiration into airway   Aspiration pneumonia (HCC)   Goals of care, counseling/discussion   Palliative care by specialist  Principal Problem: Severe sepsis w/ Extensive left lung Aspiration pneumonia with possible mucous plugging - recurrent aspiration  - S/p trach  7/20 - Appears to have stabilized for now - remains high risk for recurrent aspiration - Patient still with copious amounts of secretions - WBC had recently trended up with increased secretions. CXR demonstrated persistent PNA and pt was restarted on zosyn - Since starting abx, patient's secretions noted to have improved - WBC continues to worsen, now 23k. Abd nondistended, pt tolerating tube feeds. No report of diarrhea. Afebrile. - Will check urine culture. Repeat CXR with clearing pneumonia.  - Checked abd xray with findings of marked constipation/stool burden. 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 - Neuro suggests "dual antiplatelet therapy for 3 months followed by Plavix alone" - will need extensive PT/OT and SLP - to consider loop recorder at outpt - TEE w/o evidence of embolic source - plan is for SNF placement  - remains stable  Yeast in urine - Suspect this is not indicative of a pathogen - tx was initiated 7/27 - Patient to complete course of antimicrobial - currenlty stable - Repeat urine culture per above  Atrial septal aneurysm - TEE ruled out embolic source - remains stable  Sinus tachycardia - currently rate controlled - remains stable  Dysphagia - Pt has PEG tube in place - concerns about risk of ongoing aspiration -Patient is tolerating reglan  - Patient to continue tube feeds as tolerated. Remains stable  Ileus  - Noted to have been resolved as of 7/29 - continued on reglan  as tolerated - Staff had reported good result with bowel regimen earlier - Xray abd 8/6 reviewed. Findings of marked stool burden per my own read. Will give trial of golytely per tube  HLD Cont tx - remains stable  HTN - BP currently stable at this time  Rhabdomyolysis-resolved with IV fluids - CK had  normalized - Currently stable  Acute renal failure - resolved  - Creatinine peaked at 2.6 - renal function stable currently  History of alcoholism - remains stable without signs of withdrawals - remains stable  Mild transaminits  - Unclear etiology - currently stable  Tobacco abuse - Remains stable  Severe malnutrition in context of acute illness - Will continue with tube feeding as tolerated - Nutrition had been consulted  Normocytic anemia - Likely nutrition related - no evidence of blood loss - Hgb remains stable at 8.2  DVT prophylaxis: Heparin subQ Code Status: Full Family Communication: Pt in room, family at bedside Disposition Plan: Anticipate SNF when amount of secretions has improved  Consultants:   Neurology  Critical care  Palliative Care   Procedures:   7/13 PEG tube inserted   7/14 to ICU after vomit w/ aspiration - intubated   7/16 LE venous duplex - no DVT  7/61 TEE - no embolic source - negative bubble study - normal EF  7/18 extubated >recurrent aspiration >re-intubated   7/18 bronchoscopy for culture  7/20 tracheostomy    Antimicrobials: Anti-infectives    Start     Dose/Rate Route Frequency Ordered Stop   11/17/16 1200  piperacillin-tazobactam (ZOSYN) IVPB 3.375 g     3.375 g 12.5 mL/hr over 240 Minutes Intravenous Every 8 hours 11/17/16 1051     11/14/16 1800  fluconazole (DIFLUCAN) tablet 200 mg     200 mg Per Tube Daily-1800 11/14/16 1019     11/10/16 1700  fluconazole (DIFLUCAN) IVPB 200 mg  Status:  Discontinued     200 mg 100 mL/hr over 60 Minutes Intravenous Every 24 hours 11/10/16 1610 11/14/16 1019   11/10/16 1608  fluconazole (DIFLUCAN) IVPB 200 mg  Status:  Discontinued     200 mg 100 mL/hr over 60 Minutes Intravenous  Once 11/10/16 1608 11/10/16 1610   10/29/16 1200  vancomycin (VANCOCIN) IVPB 1000 mg/200 mL premix  Status:  Discontinued     1,000 mg 200 mL/hr over 60 Minutes Intravenous Every 12 hours  10/29/16 0046 10/31/16 1140   10/29/16 0600  piperacillin-tazobactam (ZOSYN) IVPB 3.375 g     3.375 g 12.5 mL/hr over 240 Minutes Intravenous Every 8 hours 10/29/16 0046 11/05/16 0138   10/29/16 0100  vancomycin (VANCOCIN) 1,500 mg in sodium chloride 0.9 % 500 mL IVPB     1,500 mg 250 mL/hr over 120 Minutes Intravenous  Once 10/29/16 0046 10/29/16 0539   10/29/16 0100  piperacillin-tazobactam (ZOSYN) IVPB 3.375 g     3.375 g 100 mL/hr over 30 Minutes Intravenous  Once 10/29/16 0046 10/29/16 0410   10/29/16 0045  piperacillin-tazobactam (ZOSYN) IVPB 3.375 g  Status:  Discontinued     3.375 g 100 mL/hr over 30 Minutes Intravenous  Once 10/29/16 0034 10/29/16 0036   10/29/16 0045  vancomycin (VANCOCIN) IVPB 1000 mg/200 mL premix  Status:  Discontinued     1,000 mg 200 mL/hr over 60 Minutes Intravenous  Once 10/29/16 0034 10/29/16 0037   10/26/16 1000  amoxicillin-clavulanate (AUGMENTIN) 600-42.9 MG/5ML suspension 875 mg  Status:  Discontinued     875 mg Per Tube 2 times daily  10/26/16 0911 10/29/16 0034   10/25/16 1000  ceFEPIme (MAXIPIME) 1 g in dextrose 5 % 50 mL IVPB     1 g 100 mL/hr over 30 Minutes Intravenous Every 12 hours 10/25/16 0922 10/25/16 2345   10/20/16 2200  ceFEPIme (MAXIPIME) 1 g in dextrose 5 % 50 mL IVPB  Status:  Discontinued     1 g 100 mL/hr over 30 Minutes Intravenous Every 12 hours 10/20/16 1047 10/25/16 0922   10/19/16 2300  ceFEPIme (MAXIPIME) 1 g in dextrose 5 % 50 mL IVPB  Status:  Discontinued     1 g 100 mL/hr over 30 Minutes Intravenous Every 8 hours 10/19/16 2124 10/20/16 1047   10/19/16 2200  vancomycin (VANCOCIN) IVPB 750 mg/150 ml premix  Status:  Discontinued     750 mg 150 mL/hr over 60 Minutes Intravenous Every 12 hours 10/19/16 2124 10/20/16 1131   10/19/16 2115  vancomycin (VANCOCIN) 1,500 mg in sodium chloride 0.9 % 500 mL IVPB  Status:  Discontinued     1,500 mg 250 mL/hr over 120 Minutes Intravenous  Once 10/19/16 2111 10/19/16 2117   10/19/16  2115  ceFEPIme (MAXIPIME) 2 g in dextrose 5 % 50 mL IVPB  Status:  Discontinued     2 g 100 mL/hr over 30 Minutes Intravenous  Once 10/19/16 2111 10/19/16 2120      Subjective: Remains non-verbal  Objective: Vitals:   11/20/16 0627 11/20/16 0848 11/20/16 1020 11/20/16 1212  BP:   (!) 149/83   Pulse:  (!) 106 (!) 119 (!) 108  Resp:  _0 Temp:   99.3 F (37.4 C)   TempSrc:   Oral   SpO2:  98% 96% 96%  Weight: 68.1 kg (150 lb 2.1 oz)     Height:        Intake/Output Summary (Last 24 hours) at 11/20/16 1346 Last data filed at 11/20/16 0055  Gross per 24 hour  Intake                0 ml  Output             1000 ml  Net            -1000 ml   Filed Weights   11/13/16 0452 11/13/16 1304 11/20/16 0627  Weight: 68.8 kg (151 lb 10.8 oz) 71.8 kg (158 lb 4.6 oz) 68.1 kg (150 lb 2.1 oz)    Examination: General exam: nonverbal, in no acute distress Respiratory system: normal chest rise, clear, no audible wheezing Cardiovascular system: regular rhythm, s1-s2 Gastrointestinal system: Nondistended, nontender, pos BS Central nervous system: No seizures, no tremors Extremities: No cyanosis, no joint deformities Skin: No rashes, no pallor Psychiatry: difficult to assess given nonverbal state  Data Reviewed: I have personally reviewed following labs and imaging studies  CBC:  Recent Labs Lab 11/16/16 0347 11/17/16 0421 11/18/16 0355 11/19/16 0411 11/20/16 0254  WBC 17.3* 18.4* 17.7* 22.4* 23.5*  HGB 8.0* 7.7* 8.1* 8.2* 8.1*  HCT 25.8* 24.5* 25.9* 26.2* 25.5*  MCV 87.5 88.8 90.6 89.7 90.1  PLT 953* 1,007* 1,008* 1,152* 6,283*   Basic Metabolic Panel:  Recent Labs Lab 11/14/16 0259 11/16/16 0347 11/17/16 0421 11/20/16 0254  NA 134* 137 140 145  K 3.8 4.3 4.6 4.6  CL 102 104 104 106  CO2 _1 GLUCOSE 104* 115* 108* 127*  BUN 10 21* 28* 40*  CREATININE 0.93 0.96 0.97 1.41*  CALCIUM 9.7 10.0 10.3 10.4*  GFR: Estimated Creatinine Clearance: 49.6  mL/min (A) (by C-G formula based on SCr of 1.41 mg/dL (H)). Liver Function Tests: No results for input(s): AST, ALT, ALKPHOS, BILITOT, PROT, ALBUMIN in the last 168 hours. No results for input(s): LIPASE, AMYLASE in the last 168 hours. No results for input(s): AMMONIA in the last 168 hours. Coagulation Profile: No results for input(s): INR, PROTIME in the last 168 hours. Cardiac Enzymes: No results for input(s): CKTOTAL, CKMB, CKMBINDEX, TROPONINI in the last 168 hours. BNP (last 3 results) No results for input(s): PROBNP in the last 8760 hours. HbA1C: No results for input(s): HGBA1C in the last 72 hours. CBG:  Recent Labs Lab 11/19/16 2107 11/20/16 0004 11/20/16 0409 11/20/16 0824 11/20/16 1136  GLUCAP 109* 139* 126* 120* 153*   Lipid Profile: No results for input(s): CHOL, HDL, LDLCALC, TRIG, CHOLHDL, LDLDIRECT in the last 72 hours. Thyroid Function Tests: No results for input(s): TSH, T4TOTAL, FREET4, T3FREE, THYROIDAB in the last 72 hours. Anemia Panel: No results for input(s): VITAMINB12, FOLATE, FERRITIN, TIBC, IRON, RETICCTPCT in the last 72 hours. Sepsis Labs: No results for input(s): PROCALCITON, LATICACIDVEN in the last 168 hours.  Recent Results (from the past 240 hour(s))  Culture, blood (routine x 2)     Status: None   Collection Time: 11/10/16  9:51 PM  Result Value Ref Range Status   Specimen Description BLOOD BLOOD LEFT HAND  Final   Special Requests   Final    BOTTLES DRAWN AEROBIC ONLY Blood Culture adequate volume   Culture NO GROWTH 5 DAYS  Final   Report Status 11/15/2016 FINAL  Final  Culture, blood (routine x 2)     Status: None   Collection Time: 11/10/16  9:59 PM  Result Value Ref Range Status   Specimen Description BLOOD BLOOD LEFT HAND  Final   Special Requests   Final    BOTTLES DRAWN AEROBIC AND ANAEROBIC Blood Culture results may not be optimal due to an excessive volume of blood received in culture bottles   Culture NO GROWTH 5 DAYS   Final   Report Status 11/15/2016 FINAL  Final     Radiology Studies: Dg Chest Port 1 View  Result Date: 11/20/2016 CLINICAL DATA:  Inpatient.  Pneumonia. EXAM: PORTABLE CHEST 1 VIEW COMPARISON:  11/16/2016 chest radiograph. FINDINGS: Tracheostomy tube overlies the tracheal air column just below thoracic inlet. Stable surgical clips in the left supraclavicular region and overlying the lateral lower left chest. Stable cardiomediastinal silhouette with normal heart size. No pneumothorax. No pleural effusion. Patchy bibasilar lung opacities appear slightly decreased bilaterally. No pulmonary edema. IMPRESSION: Patchy bibasilar lung opacities, slightly decreased bilaterally, suggesting clearing pneumonia and/ or decreased atelectasis. Electronically Signed   By: Ilona Sorrel M.D.   On: 11/20/2016 12:47   Dg Abd Portable 1v  Result Date: 11/20/2016 CLINICAL DATA:  Ileus. EXAM: PORTABLE ABDOMEN - 1 VIEW COMPARISON:  11/10/2016 abdominal radiograph FINDINGS: Percutaneous gastrostomy tube terminates in the left abdomen. Skin staples overlie the midline abdomen. No dilated small bowel loops. Moderate stool throughout the large bowel, increased. Mild gaseous distention of the large bowel, decreased. No evidence of pneumatosis or pneumoperitoneum. Mild-to-moderate lumbar spondylosis. 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 IMPRESSION: Nonobstructive bowel gas pattern. Moderate colonic stool volume suggests constipation. Gaseous distention of the small and large bowel has significantly decreased. Electronically Signed   By: Ilona Sorrel M.D.   On: 11/20/2016 12:49    Scheduled Meds: . amLODipine  10 mg Per Tube Daily  .  aspirin  325 mg Per Tube Daily  . chlorhexidine gluconate (MEDLINE KIT)  15 mL Mouth Rinse BID  . cloNIDine  0.1 mg Per Tube BID  . clopidogrel  75 mg Per Tube Daily  . famotidine  20 mg Per Tube BID  . feeding supplement (JEVITY 1.5 CAL/FIBER)  1,000 mL Per Tube Q24H  .  feeding supplement (PRO-STAT SUGAR FREE 64)  30 mL Per Tube TID  . fluconazole  200 mg Per Tube q1800  . heparin subcutaneous  5,000 Units Subcutaneous Q8H  . insulin aspart  0-9 Units Subcutaneous Q4H  . mouth rinse  15 mL Mouth Rinse QID  . metoCLOPramide  10 mg Per Tube TID AC & HS  . metoprolol tartrate  100 mg Per Tube BID  . multivitamin with minerals  1 tablet Per Tube Daily  . nicotine  14 mg Transdermal Daily  . pravastatin  20 mg Per Tube q1800  . thiamine  100 mg Per Tube Daily   Continuous Infusions: . sodium chloride 50 mL/hr at 11/20/16 1314  . piperacillin-tazobactam (ZOSYN)  IV 3.375 g (11/20/16 1145)     LOS: 32 days   , Orpah Melter, MD [facility] Pager [phone]  If 7PM-7AM, please contact night-coverage [email or web address] Password [email or web address] 11/20/2016, 1:46 PM

## 2016-11-20 NOTE — Progress Notes (Signed)
Pharmacy Antibiotic Note  Scott Rivera is a 66 y.o. male day #4 Zoysn since restarted for aspiration pneumonia.  Pharmacy assisted with antibiotic dosing earlier this admission for sepsis and HCAP.  Currently on day # 11 Fluconazole - yeast in urine culture 11/07/16.  Repeat culture to be sent today.  Blood cultures also sent.  WBC trended up, Tmax 99.4. CXR suggests pneumonia is clearing.  BUN/Creatinine trended up some but Zosyn dose remains appropriate.   Plan:  Continue Zosyn 3.375 gm IV q8hrs (each over 4 hours).  Do not expect any need to adjust Zosyn dose for renal function.  Will follow clinical progress, length of therapy and/or transition to oral antibiotics.  Follow up repeat urine culture and Fluconazole stop date.  Height: _0  (180.3 cm) Weight: 150 lb 2.1 oz (68.1 kg) IBW/kg (Calculated) : 75.3  Temp (24hrs), Avg:99 F (37.2 C), Min:98.6 F (37 C), Max:99.4 F (37.4 C)   Recent Labs Lab 11/14/16 0259 11/16/16 0347 11/17/16 0421 11/18/16 0355 11/19/16 0411 11/20/16 0254  WBC 16.8* 17.3* 18.4* 17.7* 22.4* 23.5*  CREATININE 0.93 0.96 0.97  --   --  1.41*    Estimated Creatinine Clearance: 49.6 mL/min (A) (by C-G formula based on SCr of 1.41 mg/dL (H)).    No Known Allergies  Antimicrobials this admission: Vancomycin 7/5>>7/6; 7/15 >>7/17 for sepsis Cefepime 7/5>>7/11 Augmentin 7/12 >> 7/14 Fluconazole IV 7/27>>per tube 7/31>> Zosyn 7/15 >>7/22 for sepsis; resumed 8/3 for aspiration pna >>  Dose adjustments this admission:   N/a  Microbiology results: 7/6MRSA PCR: negative 7/15 respiratory - normal flora 7/15 blood x 2 - negative 7/18 BAL - normal flora 7/23 blood x 2 - negative 7/24 urine - yeast  7/26 blood x 2 - negative 7/27 blood x 2 - negative  8/6 blood x 2 -  8/6 urine -  Thank you for allowing pharmacy to be a part of this patient's care.  Arty Baumgartner, [place] Pager: [phone] 11/20/2016 2:56 PM

## 2016-11-21 LAB — BASIC METABOLIC PANEL
Anion gap: 10 (ref 5–15)
BUN: 38 mg/dL — ABNORMAL HIGH (ref 6–20)
CO2: 28 mmol/L (ref 22–32)
Calcium: 10.5 mg/dL — ABNORMAL HIGH (ref 8.9–10.3)
Chloride: 110 mmol/L (ref 101–111)
Creatinine, Ser: 1.42 mg/dL — ABNORMAL HIGH (ref 0.61–1.24)
GFR calc Af Amer: 58 mL/min — ABNORMAL LOW (ref >60)
GFR calc non Af Amer: 50 mL/min — ABNORMAL LOW (ref >60)
Glucose, Bld: 149 mg/dL — ABNORMAL HIGH (ref 65–99)
Potassium: 4.4 mmol/L (ref 3.5–5.1)
Sodium: 148 mmol/L — ABNORMAL HIGH (ref 135–145)

## 2016-11-21 LAB — CBC
HCT: 24.5 % — ABNORMAL LOW (ref 39.0–52.0)
Hemoglobin: 7.5 g/dL — ABNORMAL LOW (ref 13.0–17.0)
MCH: 27.5 pg (ref 26.0–34.0)
MCHC: 30.6 g/dL (ref 30.0–36.0)
MCV: 89.7 fL (ref 78.0–100.0)
Platelets: 991 10*3/uL (ref 150–400)
RBC: 2.73 MIL/uL — ABNORMAL LOW (ref 4.22–5.81)
RDW: 15.4 % (ref 11.5–15.5)
WBC: 21.8 10*3/uL — ABNORMAL HIGH (ref 4.0–10.5)

## 2016-11-21 LAB — GLUCOSE, CAPILLARY
Glucose-Capillary: 123 mg/dL — ABNORMAL HIGH (ref 65–99)
Glucose-Capillary: 123 mg/dL — ABNORMAL HIGH (ref 65–99)
Glucose-Capillary: 124 mg/dL — ABNORMAL HIGH (ref 65–99)
Glucose-Capillary: 127 mg/dL — ABNORMAL HIGH (ref 65–99)
Glucose-Capillary: 130 mg/dL — ABNORMAL HIGH (ref 65–99)
Glucose-Capillary: 143 mg/dL — ABNORMAL HIGH (ref 65–99)
Glucose-Capillary: 150 mg/dL — ABNORMAL HIGH (ref 65–99)

## 2016-11-21 NOTE — Progress Notes (Signed)
eLink Physician-Brief Progress Note Patient Name: Scott Rivera DOB: 29-Nov-1950 MRN: [id number]   Date of Service  11/21/2016  HPI/Events of Note  RT unable to remove trach due to tightness.   eICU Interventions  Will have PCCM team eval in AM.         Laverle Hobby 11/21/2016, 4:43 PM

## 2016-11-21 NOTE — Progress Notes (Signed)
RT attempted to change trach. Two RTs were unable to remove cuffed trach from pt, meeting too much resistance. CCM paged on both pagers, will wait for further instruction. Pt stable with no distress noted at this time.

## 2016-11-21 NOTE — Progress Notes (Signed)
PROGRESS NOTE    ACETON KINNEAR  TSV:[id number] DOB: [birth year] DOA: 10/19/2016 PCP: Patient, No Pcp Per    Brief Narrative:  66 year-old male with past mental history of hypertension brought in by family to [facility] on 7/1 after being found down. At that time, MRI noted multiple acute infarcts involving both cerebellar hemispheres plus right occipital lobe, right midbrain and left thalamus. At time of admission, patient also noted to be in acute kidney injury plus rhabdomyolysis. Family requested transfer to [facility] and this was done on 7/5.  Patient's hospital course was noted for placement of PEG tube on 7/13 and then respiratory distress leading to intubation following aspiration event on 7/14. Patient extubated, but then had recurrent aspiration and reintubated on 7/18. Patient underwent tracheostomy on 7/20. Hospitalist resume care on 7/23 the patient overall has felt to be stable and moved up to the floor on 7/30.  No events overnight. Social work in process of finding a skilled nursing facility that accepts tracheostomy. Patient's mentation waxes and wanes along with alertness. Being followed by speech therapy. Currently not able to vocalize.  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[place]) Active Problems:   ARF (acute renal failure) (HCC)   Essential hypertension   Rhabdomyolysis   Acute ischemic stroke (HCC)   Pressure injury of skin   Dysphagia, post-stroke   Benign essential HTN   Tobacco abuse   Tachypnea   Hyperglycemia   Hypernatremia   Leukocytosis   Acute blood loss anemia   Chronic kidney disease   Septic shock (HCC)   Acute respiratory failure (HCC)   Aspiration into airway   Aspiration pneumonia (HCC)   Goals of care, counseling/discussion   Palliative care by specialist  Principal Problem: Severe sepsis w/ Extensive left lung Aspiration pneumonia with possible mucous plugging - recurrent aspiration  - S/p trach  7/20 - Appears to have stabilized for now - remains high risk for recurrent aspiration - Patient still with copious amounts of secretions - WBC had recently trended up with increased secretions. CXR demonstrated persistent PNA and pt was restarted on zosyn - Since starting abx, patient's secretions noted to have improved - WBC peaked to 23k. Abd nondistended, pt tolerating tube feeds. No report of diarrhea. Remains afebrile. - Will check urine culture. Repeat CXR with clearing pneumonia.  - Checked abd xray with findings of marked constipation/stool burden. 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 - Neuro suggests "dual antiplatelet therapy for 3 months followed by Plavix alone" - will need extensive PT/OT and SLP - to consider loop recorder at outpt - TEE w/o evidence of embolic source - plan is for SNF placement  - Remains stable  Yeast in urine - Suspect this is not indicative of a pathogen - tx was initiated 7/27 - Patient to complete course of antimicrobial - currenlty stable - Repeat urine culture pending  Atrial septal aneurysm - TEE ruled out embolic source - currently stable  Sinus tachycardia - currently rate controlled - presently stable  Dysphagia - Pt has PEG tube in place - concerns about risk of ongoing aspiration -Patient is tolerating reglan  - Patient to continue tube feeds as tolerated. Thus far stable  Ileus  - Noted to have been resolved as of 7/29 - continued  on reglan as tolerated - Staff had reported good result with bowel regimen earlier - Xray abd 8/6 reviewed. Findings of marked stool burden per my own read.  - As of 8/7, large bowel movement reported by nursing. Cont to monitor to ensure BM and continue cathartics as needed  HLD Cont tx - remains stable  HTN - BP  currently stable and controlled  Rhabdomyolysis-resolved with IV fluids - CK had normalized - remains stable  Acute renal failure - resolved  - Creatinine peaked at 2.6 - renal function stable, cr currently 1.42  History of alcoholism - remains stable without signs of withdrawals - currently stable  Mild transaminits  - Unclear etiology - remains stable  Tobacco abuse - Remains stable  Severe malnutrition in context of acute illness - Will continue with tube feeding as tolerated - Nutrition following  Normocytic anemia - Likely nutrition related - no evidence of blood loss - Hgb had remained stable  DVT prophylaxis: Heparin subQ Code Status: Full Family Communication: Pt in room, family at bedside Disposition Plan: Anticipate SNF when WBC improves and if patient has minimal secretions  Consultants:   Neurology  Critical care  Palliative Care   Procedures:   7/13 PEG tube inserted   7/14 to ICU after vomit w/ aspiration - intubated   7/16 LE venous duplex - no DVT  6/83 TEE - no embolic source - negative bubble study - normal EF  7/18 extubated >recurrent aspiration >re-intubated   7/18 bronchoscopy for culture  7/20 tracheostomy    Antimicrobials: Anti-infectives    Start     Dose/Rate Route Frequency Ordered Stop   11/17/16 1200  piperacillin-tazobactam (ZOSYN) IVPB 3.375 g     3.375 g 12.5 mL/hr over 240 Minutes Intravenous Every 8 hours 11/17/16 1051     11/14/16 1800  fluconazole (DIFLUCAN) tablet 200 mg     200 mg Per Tube Daily-1800 11/14/16 1019     11/10/16 1700  fluconazole (DIFLUCAN) IVPB 200 mg  Status:  Discontinued     200 mg 100 mL/hr over 60 Minutes Intravenous Every 24 hours 11/10/16 1610 11/14/16 1019   11/10/16 1608  fluconazole (DIFLUCAN) IVPB 200 mg  Status:  Discontinued     200 mg 100 mL/hr over 60 Minutes Intravenous  Once 11/10/16 1608 11/10/16 1610   10/29/16 1200  vancomycin (VANCOCIN) IVPB 1000 mg/200 mL premix   Status:  Discontinued     1,000 mg 200 mL/hr over 60 Minutes Intravenous Every 12 hours 10/29/16 0046 10/31/16 1140   10/29/16 0600  piperacillin-tazobactam (ZOSYN) IVPB 3.375 g     3.375 g 12.5 mL/hr over 240 Minutes Intravenous Every 8 hours 10/29/16 0046 11/05/16 0138   10/29/16 0100  vancomycin (VANCOCIN) 1,500 mg in sodium chloride 0.9 % 500 mL IVPB     1,500 mg 250 mL/hr over 120 Minutes Intravenous  Once 10/29/16 0046 10/29/16 0539   10/29/16 0100  piperacillin-tazobactam (ZOSYN) IVPB 3.375 g     3.375 g 100 mL/hr over 30 Minutes Intravenous  Once 10/29/16 0046 10/29/16 0410   10/29/16 0045  piperacillin-tazobactam (ZOSYN) IVPB 3.375 g  Status:  Discontinued     3.375 g 100 mL/hr over 30 Minutes Intravenous  Once 10/29/16 0034 10/29/16 0036   10/29/16 0045  vancomycin (VANCOCIN) IVPB 1000 mg/200 mL premix  Status:  Discontinued     1,000 mg 200 mL/hr over 60 Minutes Intravenous  Once 10/29/16 0034 10/29/16 0037   10/26/16 1000  amoxicillin-clavulanate (AUGMENTIN) 600-42.9 MG/5ML  suspension 875 mg  Status:  Discontinued     875 mg Per Tube 2 times daily 10/26/16 0911 10/29/16 0034   10/25/16 1000  ceFEPIme (MAXIPIME) 1 g in dextrose 5 % 50 mL IVPB     1 g 100 mL/hr over 30 Minutes Intravenous Every 12 hours 10/25/16 0922 10/25/16 2345   10/20/16 2200  ceFEPIme (MAXIPIME) 1 g in dextrose 5 % 50 mL IVPB  Status:  Discontinued     1 g 100 mL/hr over 30 Minutes Intravenous Every 12 hours 10/20/16 1047 10/25/16 0922   10/19/16 2300  ceFEPIme (MAXIPIME) 1 g in dextrose 5 % 50 mL IVPB  Status:  Discontinued     1 g 100 mL/hr over 30 Minutes Intravenous Every 8 hours 10/19/16 2124 10/20/16 1047   10/19/16 2200  vancomycin (VANCOCIN) IVPB 750 mg/150 ml premix  Status:  Discontinued     750 mg 150 mL/hr over 60 Minutes Intravenous Every 12 hours 10/19/16 2124 10/20/16 1131   10/19/16 2115  vancomycin (VANCOCIN) 1,500 mg in sodium chloride 0.9 % 500 mL IVPB  Status:  Discontinued      1,500 mg 250 mL/hr over 120 Minutes Intravenous  Once 10/19/16 2111 10/19/16 2117   10/19/16 2115  ceFEPIme (MAXIPIME) 2 g in dextrose 5 % 50 mL IVPB  Status:  Discontinued     2 g 100 mL/hr over 30 Minutes Intravenous  Once 10/19/16 2111 10/19/16 2120      Subjective: Nonverbal this AM  Objective: Vitals:   11/21/16 0442 11/21/16 0455 11/21/16 0742 11/21/16 0929  BP: (!) 130/52   119/60  Pulse: (!) 102  98 83  Resp: 18  20 20   Temp: 99.1 F (37.3 C)   98.6 F (37 C)  TempSrc: Oral   Oral  SpO2: 98%  95% 100%  Weight:  70 kg (154 lb 5.2 oz)    Height:        Intake/Output Summary (Last 24 hours) at 11/21/16 1338 Last data filed at 11/21/16 0418  Gross per 24 hour  Intake                0 ml  Output             1300 ml  Net            -1300 ml   Filed Weights   11/13/16 1304 11/20/16 0627 11/21/16 0455  Weight: 71.8 kg (158 lb 4.6 oz) 68.1 kg (150 lb 2.1 oz) 70 kg (154 lb 5.2 oz)    Examination: General exam: Awake, laying in bed, in nad Respiratory system: Normal respiratory effort, no wheezing Cardiovascular system: regular rate, s1, s2 Gastrointestinal system: Soft, nondistended, positive BS Central nervous system: CN2-12 grossly intact, strength intact Extremities: Perfused, no clubbing Skin: Normal skin turgor, no notable skin lesions seen Psychiatry: unable to assess as pt is nonverbal  Data Reviewed: I have personally reviewed following labs and imaging studies  CBC:  Recent Labs Lab 11/17/16 0421 11/18/16 0355 11/19/16 0411 11/20/16 0254 11/21/16 0325  WBC 18.4* 17.7* 22.4* 23.5* 21.8*  HGB 7.7* 8.1* 8.2* 8.1* 7.5*  HCT 24.5* 25.9* 26.2* 25.5* 24.5*  MCV 88.8 90.6 89.7 90.1 89.7  PLT 1,007* 1,008* 1,152* 1,097* 700*   Basic Metabolic Panel:  Recent Labs Lab 11/16/16 0347 11/17/16 0421 11/20/16 0254 11/21/16 0809  NA 137 140 145 148*  K 4.3 4.6 4.6 4.4  CL 104 104 106 110  CO2 25 26 27  28  GLUCOSE 115* 108* 127* 149*  BUN 21* 28* 40*  38*  CREATININE 0.96 0.97 1.41* 1.42*  CALCIUM 10.0 10.3 10.4* 10.5*   GFR: Estimated Creatinine Clearance: 50.7 mL/min (A) (by C-G formula based on SCr of 1.42 mg/dL (H)). Liver Function Tests: No results for input(s): AST, ALT, ALKPHOS, BILITOT, PROT, ALBUMIN in the last 168 hours. No results for input(s): LIPASE, AMYLASE in the last 168 hours. No results for input(s): AMMONIA in the last 168 hours. Coagulation Profile: No results for input(s): INR, PROTIME in the last 168 hours. Cardiac Enzymes: No results for input(s): CKTOTAL, CKMB, CKMBINDEX, TROPONINI in the last 168 hours. BNP (last 3 results) No results for input(s): PROBNP in the last 8760 hours. HbA1C: No results for input(s): HGBA1C in the last 72 hours. CBG:  Recent Labs Lab 11/20/16 1956 11/21/16 0022 11/21/16 0441 11/21/16 0812 11/21/16 1123  GLUCAP 131* 123* 123* 143* 150*   Lipid Profile: No results for input(s): CHOL, HDL, LDLCALC, TRIG, CHOLHDL, LDLDIRECT in the last 72 hours. Thyroid Function Tests: No results for input(s): TSH, T4TOTAL, FREET4, T3FREE, THYROIDAB in the last 72 hours. Anemia Panel: No results for input(s): VITAMINB12, FOLATE, FERRITIN, TIBC, IRON, RETICCTPCT in the last 72 hours. Sepsis Labs: No results for input(s): PROCALCITON, LATICACIDVEN in the last 168 hours.  No results found for this or any previous visit (from the past 240 hour(s)).   Radiology Studies: Dg Chest Port 1 View  Result Date: 11/20/2016 CLINICAL DATA:  Inpatient.  Pneumonia. EXAM: PORTABLE CHEST 1 VIEW COMPARISON:  11/16/2016 chest radiograph. FINDINGS: Tracheostomy tube overlies the tracheal air column just below thoracic inlet. Stable surgical clips in the left supraclavicular region and overlying the lateral lower left chest. Stable cardiomediastinal silhouette with normal heart size. No pneumothorax. No pleural effusion. Patchy bibasilar lung opacities appear slightly decreased bilaterally. No pulmonary edema.  IMPRESSION: Patchy bibasilar lung opacities, slightly decreased bilaterally, suggesting clearing pneumonia and/ or decreased atelectasis. Electronically Signed   By: Ilona Sorrel M.D.   On: 11/20/2016 12:47   Dg Abd Portable 1v  Result Date: 11/20/2016 CLINICAL DATA:  Ileus. EXAM: PORTABLE ABDOMEN - 1 VIEW COMPARISON:  11/10/2016 abdominal radiograph FINDINGS: Percutaneous gastrostomy tube terminates in the left abdomen. Skin staples overlie the midline abdomen. No dilated small bowel loops. Moderate stool throughout the large bowel, increased. Mild gaseous distention of the large bowel, decreased. No evidence of pneumatosis or pneumoperitoneum. Mild-to-moderate lumbar spondylosis. 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 IMPRESSION: Nonobstructive bowel gas pattern. Moderate colonic stool volume suggests constipation. Gaseous distention of the small and large bowel has significantly decreased. Electronically Signed   By: Ilona Sorrel M.D.   On: 11/20/2016 12:49    Scheduled Meds: . amLODipine  10 mg Per Tube Daily  . aspirin  325 mg Per Tube Daily  . chlorhexidine gluconate (MEDLINE KIT)  15 mL Mouth Rinse BID  . cloNIDine  0.1 mg Per Tube BID  . clopidogrel  75 mg Per Tube Daily  . famotidine  20 mg Per Tube BID  . feeding supplement (JEVITY 1.5 CAL/FIBER)  1,000 mL Per Tube Q24H  . feeding supplement (PRO-STAT SUGAR FREE 64)  30 mL Per Tube TID  . fluconazole  200 mg Per Tube q1800  . heparin subcutaneous  5,000 Units Subcutaneous Q8H  . insulin aspart  0-9 Units Subcutaneous Q4H  . mouth rinse  15 mL Mouth Rinse QID  . metoCLOPramide  10 mg Per Tube TID AC & HS  . metoprolol  tartrate  100 mg Per Tube BID  . multivitamin with minerals  1 tablet Per Tube Daily  . nicotine  14 mg Transdermal Daily  . pravastatin  20 mg Per Tube q1800  . thiamine  100 mg Per Tube Daily   Continuous Infusions: . sodium chloride 50 mL/hr at 11/20/16 1314  . piperacillin-tazobactam (ZOSYN)   IV 3.375 g (11/21/16 1244)     LOS: 33 days   Bradshaw Minihan, Orpah Melter, MD [facility] Pager [phone]  If 7PM-7AM, please contact night-coverage [email or web address] Password [email or web address] 11/21/2016, 1:38 PM
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
## 2016-11-21 NOTE — Progress Notes (Signed)
CSW met with patient's sister, [name], at bedside to review updated bed offers and determine facility preference. CSW confirmed with patient's sister that the previous choice, [facility], would no longer accept the patient due to his trach and increased care needs. CSW provided patient's sister with updated bed offers, and patient's sister requested that CSW look closer to [facility], if possible. CSW indicated that she would look into additional options that may be more convenient, but that the options would likely be limited due to the patient's current required level of care. Patient's sister indicated understanding.  CSW faxed referrals to [facility] facilities, and will follow up with any additional offers. CSW will initiate insurance authorization when patient's sister selects facility preference.  Laveda Abbe,  Clinical Social Worker [phone]

## 2016-11-21 NOTE — Progress Notes (Signed)
  Speech Language Pathology Treatment: Dysphagia;Passy Muir Speaking valve  Patient Details Name: Scott Rivera MRN: [id number] DOB: 1950-06-24 Today's Date: 11/21/2016 Time: 3159-4585 SLP Time Calculation (min) (ACUTE ONLY): 23 min  Assessment / Plan / Recommendation Clinical Impression  Pt was seen for skilled co-tx with PT to maximize alertness and safety with PO trials. PMV was donned prior to sitting EOB. Pt consumed two ice chips, both of which were followed by immediate coughing. Pt's SpO2 dropped from 100% to 90% with sitting EOB, suspect a combination of workload from sitting and/or audible secretions. Pt intermittently produces a soft, nonproductive cough despite Max cues provided by SLP and sister. SpO2 did not improve with trial of valve removal, but it did increase some when he was laying back in the bed. Current recommendations remain appropriate.   HPI HPI: 66 y.o. male with history of hypertension was brought to [facility] on 10/15/2016 after patient was found to be confused. As per the family who provided most of the history patient was not able to be raised and family went to check on him at home and was found on the floor confused. Patient was taken to the ER. The last contact was 2 days ago by patient's brother prior to the episode. At [facility] MRI showed multiple acute infarcts involving the both hemisphere of the cerebellum and right occipital lobe right midbrain and left thalamus the fourth ventricle was patent no hydrocephalus. 2-D echo showing EF of 55-60% normal LV size. Carotid Doppler was done which showed right ICA 40-59% stenosis with left 60-79% stenosis. Chest x-ray was showing infiltrates and patient also was febrile for which patient was started on antibiotics. Patient's creatinine at admission was 2.6 which improved to 1.5 the time of discharge. Patient also had rhabdomyolysis with CK of 7000 at the time of admission. As  per the note patient was initially agitated requiring Haldol at the other hospital. Patient's mentation slowly improved gradually and family requested transfer to [facility] after discussing with neurologist at [facility] Dr. Leonel Ramsay      SLP Plan  Continue with current plan of care       Recommendations  Diet recommendations: NPO Medication Administration: Via alternative means      Patient may use Passy-Muir Speech Valve: During all therapies with supervision;Intermittently with supervision PMSV Supervision: Full MD: Please consider changing trach tube to : Cuffless         Oral Care Recommendations: Oral care QID Follow up Recommendations: Skilled Nursing facility SLP Visit Diagnosis: Dysphagia, unspecified (R13.10);[place]ph[place] (R49.1) Plan: Continue with current plan of care       GO                Scott Rivera 11/21/2016, 1:21 PM  Scott Rivera, M.A. CCC-SLP [phone]

## 2016-11-21 NOTE — Progress Notes (Signed)
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  Transferred to [facility] on 7/5, rhabdomyolysis resolved, treated for aspiration with 7 days of antibiotics, had severe dysarthria &dysphagia requiring PEG 7/13 and had another aspiration episode 7/15 requiring mechanical ventilation. Unfortunately, failed extubation on 7/18 and required reintubation, then trach.  Subjective:  RN reports no issues with trach.  Primary MD concerned about constipation, mild fever.   Vital signs: BP 119/60 (BP Location: Right Arm)   Pulse 83   Temp 98.6 F (37 C) (Oral)   Resp 20   Ht 5\' 11"  (1.803 m)   Wt 154 lb 5.2 oz (70 kg)   SpO2 100%   BMI 21.52 kg/m   Intake/output: I/O last 3 completed shifts: In: -  Out: 1750 [Urine:1750]  General: frail adult male, chronically ill appearing  HEENT: MM pink/moist, #6 trach midline c/d/i Neuro: eyes open to voice, nods yes/no intermittently to questions, no speech with PMV in place CV: s1s2 rrr, no m/r/g PULM: even/non-labored, lungs bilaterally coarse, strong cough / able to clear secretions  BJ:SEGB, non-tender, bsx4 active  Extremities: warm/dry, no edema  Skin: no rashes or lesions, healing scar on forehead    CMP Latest Ref Rng & Units 11/21/2016 11/20/2016 11/17/2016  Glucose 65 - 99 mg/dL 149(H) 127(H) 108(H)  BUN 6 - 20 mg/dL 38(H) 40(H) 28(H)  Creatinine 0.61 - 1.24 mg/dL 1.42(H) 1.41(H) 0.97  Sodium 135 - 145 mmol/L 148(H) 145 140  Potassium 3.5 - 5.1 mmol/L 4.4 4.6 4.6  Chloride 101 - 111 mmol/L 110 106 104  CO2 22 - 32 mmol/L 28 27 26   Calcium 8.9 - 10.3 mg/dL 10.5(H) 10.4(H) 10.3  Total Protein 6.5 - 8.1 g/dL - - -  Total Bilirubin 0.3 - 1.2 mg/dL - - -  Alkaline Phos 38 - 126 U/L - - -  AST 15 - 41 U/L - - -  ALT 17 - 63 U/L - - -     CBC Latest Ref  Rng & Units 11/21/2016 11/20/2016 11/19/2016  WBC 4.0 - 10.5 K/uL 21.8(H) 23.5(H) 22.4(H)  Hemoglobin 13.0 - 17.0 g/dL 7.5(L) 8.1(L) 8.2(L)  Hematocrit 39.0 - 52.0 % 24.5(L) 25.5(L) 26.2(L)  Platelets 150 - 400 K/uL 991(HH) 1,097(HH) 1,152(HH)     ABG    Component Value Date/Time   PHART 7.499 (H) 11/07/2016 1254   PCO2ART 34.1 11/07/2016 1254   PO2ART 64.0 (L) 11/07/2016 1254   HCO3 26.4 11/07/2016 1254   TCO2 27 11/07/2016 1254   ACIDBASEDEF 2.0 10/29/2016 0905   O2SAT 94.0 11/07/2016 1254    CBG (last 3)   Recent Labs  11/21/16 0022 11/21/16 0441 11/21/16 0812  GLUCAP 123* 123* 143*    Imaging: Dg Chest Port 1 View  Result Date: 11/20/2016 CLINICAL DATA:  Inpatient.  Pneumonia. EXAM: PORTABLE CHEST 1 VIEW COMPARISON:  11/16/2016 chest radiograph. FINDINGS: Tracheostomy tube overlies the tracheal air column just below thoracic inlet. Stable surgical clips in the left supraclavicular region and overlying the lateral lower left chest. Stable cardiomediastinal silhouette with normal heart size. No pneumothorax. No pleural effusion. Patchy bibasilar lung opacities appear slightly decreased bilaterally. No pulmonary edema. IMPRESSION: Patchy bibasilar lung opacities, slightly decreased bilaterally, suggesting clearing pneumonia and/ or decreased atelectasis. Electronically Signed   By: Ilona Sorrel M.D.   On: 11/20/2016 12:47  Dg Abd Portable 1v  Result Date: 11/20/2016 CLINICAL DATA:  Ileus. EXAM: PORTABLE ABDOMEN - 1 VIEW COMPARISON:  11/10/2016 abdominal radiograph FINDINGS: Percutaneous gastrostomy tube terminates in the left abdomen. Skin staples overlie the midline abdomen. No dilated small bowel loops. Moderate stool throughout the large bowel, increased. Mild gaseous distention of the large bowel, decreased. No evidence of pneumatosis or pneumoperitoneum. Mild-to-moderate lumbar spondylosis. 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 IMPRESSION: Nonobstructive  bowel gas pattern. Moderate colonic stool volume suggests constipation. Gaseous distention of the small and large bowel has significantly decreased. Electronically Signed   By: Ilona Sorrel M.D.   On: 11/20/2016 12:49    Lines/tubes: Lurline Idol 7/20 >> change to cuffless 8/7 >>   Assessment/plan:  Acute Hypoxemic Respiratory Failure - secondary to aspiration pneumonia in setting of CVA. Failure to wean s/p Tracheostomy - sutures out 7/29, change to cuffless 8/7  Plan: Change trach to #6 cuffless  ATC O2 as needed to support sats > 90% If he no longer requires O2, will likely need ATC for humidity Chest PT as tolerated  SLP following, appreciate assistance > rec's for PMV only with supervision  Would not consider downsizing at this time due to immobility, generalized weakness.  Will need SNF placement   PCCM will continue to follow.    Noe Gens, NP-C [facility] Pgr: [phone] or if no answer [phone] 11/21/2016, 11:02 AM

## 2016-11-21 NOTE — Progress Notes (Signed)
Nutrition Follow-up  DOCUMENTATION CODES:   Severe malnutrition in context of acute illness/injury  INTERVENTION:    Continue Jevity 1.5 formula at goal rate of 40 ml/hr with Prostat liquid protein 30 ml TID   TF regimen providing 1740 kcals, 106 gm protein, 729 ml of free water  NUTRITION DIAGNOSIS:   Malnutrition related to acute illness as evidenced by severe depletion of body fat, severe depletion of muscle mass, energy intake < or equal to 50% for > or equal to 5 days  Ongoing  GOAL:   Patient will meet greater than or equal to 90% of their needs   Met  MONITOR:   Labs, Weight trends, TF tolerance, Skin, I & O's  ASSESSMENT:   66 yo male with PMH of HTN, stroke who was admitted to [facility] on 7/1 after family found him on the floor confused. MRI showed multiple acute infarcts. He was transferred to [facility] on 7/5 at family's request.   PEG tube placed 7/13 Bedside tracheostomy 7/20  Jevity 1.5 formula currently infusing at goal rate of 40 ml/hr via PEG tube. Medications reviewed and include MVI, thiamine and Reglan. Labs reviewed. Na 148 (H). CBG's 123-143-150. Plan to change trach to #6 Shiley cuffless. Dispo: SNF placement.  Diet Order:  Diet - low sodium heart healthy Diet NPO time specified  Skin:  Wound (see comment) (stage II to R buttocks, L shoulder; DTI to heels)  Last BM:  8/4  Height:   Ht Readings from Last 1 Encounters:  11/07/16 5' 11" (1.803 m)   Weight: >> stable  Wt Readings from Last 1 Encounters:  11/21/16 154 lb 5.2 oz (70 kg)   Ideal Body Weight:  78.2 kg  BMI:  Body mass index is 21.52 kg/m.  Estimated Nutritional Needs:   Kcal:  1850-2050  Protein:  95-110 gm  Fluid:  1.8-2.0 L  EDUCATION NEEDS:   No education needs identified at this time  Katie , RD, LDN Pager #: [phone] After-Hours Pager #: [phone]   

## 2016-11-22 LAB — GLUCOSE, CAPILLARY
Glucose-Capillary: 125 mg/dL — ABNORMAL HIGH (ref 65–99)
Glucose-Capillary: 124 mg/dL — ABNORMAL HIGH (ref 65–99)
Glucose-Capillary: 139 mg/dL — ABNORMAL HIGH (ref 65–99)
Glucose-Capillary: 122 mg/dL — ABNORMAL HIGH (ref 65–99)
Glucose-Capillary: 115 mg/dL — ABNORMAL HIGH (ref 65–99)

## 2016-11-22 LAB — CBC
HCT: 23.4 % — ABNORMAL LOW (ref 39.0–52.0)
Hemoglobin: 7.2 g/dL — ABNORMAL LOW (ref 13.0–17.0)
MCH: 28.1 pg (ref 26.0–34.0)
MCHC: 30.8 g/dL (ref 30.0–36.0)
MCV: 91.4 fL (ref 78.0–100.0)
Platelets: 921 10*3/uL (ref 150–400)
RBC: 2.56 MIL/uL — ABNORMAL LOW (ref 4.22–5.81)
RDW: 15.8 % — ABNORMAL HIGH (ref 11.5–15.5)
WBC: 23 10*3/uL — ABNORMAL HIGH (ref 4.0–10.5)

## 2016-11-22 LAB — BASIC METABOLIC PANEL
Anion gap: 10 (ref 5–15)
BUN: 39 mg/dL — ABNORMAL HIGH (ref 6–20)
CO2: 27 mmol/L (ref 22–32)
Calcium: 11 mg/dL — ABNORMAL HIGH (ref 8.9–10.3)
Chloride: 113 mmol/L — ABNORMAL HIGH (ref 101–111)
Creatinine, Ser: 1.45 mg/dL — ABNORMAL HIGH (ref 0.61–1.24)
GFR calc Af Amer: 56 mL/min — ABNORMAL LOW (ref >60)
GFR calc non Af Amer: 49 mL/min — ABNORMAL LOW (ref >60)
Glucose, Bld: 153 mg/dL — ABNORMAL HIGH (ref 65–99)
Potassium: 4.2 mmol/L (ref 3.5–5.1)
Sodium: 150 mmol/L — ABNORMAL HIGH (ref 135–145)

## 2016-11-22 NOTE — Progress Notes (Signed)
PROGRESS NOTE    Scott Rivera  XWR:[id number] DOB: March 24, 1951 DOA: 10/19/2016 PCP: Patient, No Pcp Per   Brief Narrative: Scott Rivera is a 66 y.o. male with past mental history of hypertension brought in by family to [facility] on 7/1 after being found down. At that time, MRI noted multiple acute infarcts involving both cerebellar hemispheres plus right occipital lobe, right midbrain and left thalamus. At time of admission, patient also noted to be in acute kidney injury plus rhabdomyolysis. Family requested transfer to [facility] and this was done on 7/5.  Patient's hospital course was noted for placement of PEG tube on 7/13 and then respiratory distress leading to intubation following aspiration event on 7/14. Patient extubated, but then had recurrent aspiration and reintubated on 7/18. Patient underwent tracheostomy on 7/20. Hospitalist resume care on 7/23 the patient overall has felt to be stable and moved up to the floor on 7/30.  No events overnight. Social work in process of finding a skilled nursing facility that accepts tracheostomy. Patient's mentation waxes and wanes along with alertness. Being followed by speech therapy. Currently not able to vocalize.   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[place]) Active Problems:   ARF (acute renal failure) (HCC)   Essential hypertension   Rhabdomyolysis   Acute ischemic stroke (HCC)   Pressure injury of skin   Dysphagia, post-stroke   Benign essential HTN   Tobacco abuse   Tachypnea   Hyperglycemia   Hypernatremia   Leukocytosis   Acute blood loss anemia   Chronic kidney disease   Septic shock (HCC)   Acute respiratory failure (HCC)   Aspiration into airway   Aspiration pneumonia (HCC)   Goals of care, counseling/discussion   Palliative care by specialist   Severe sepsis Secondary to aspiration pneumonia. Management below  Aspiration pneumonia Recurrent. Significantly elevated  WBC. -continue Zosyn -repeat CBC in am for leukocytosis  Tracheotomy S/p recurrent intubation from recurrent aspiration -pulmonology recommendations  Multiple acute strokes Bilateral posterior cerebral hemispheres. Dysarthria and left hemiparesis -continue DAPT for 3 months, followed by Plavix alone  Yeast in urine Treating.  Atrial septal aneurysm No embolism seen on TEE  Dysphagia PEG tube in place. -continue tube feeds  Ileus Resolved.  Hyperlipidemia -continue pravastatin  Hypertension Stable. -continue clonidine and amlodipine  Rhabdomyolysis Resolved with IV fluids  Acute kidney injury Improved and back up. Now stable.  Hypernatremia Possibly secondary to dehydration in setting of tube feeds -repeat BMP. Will need to possibly adjust regimen if worsening  History of alcoholism Stable. No withdrawal  Transaminitis Mild. Resolved.  Severe malnutrition -nutrition recommendations -tube feeds  Tobacco abuse  Thrombocytosis Likely reactive in setting of severe illness. Trending down -repeat CBC  DVT prophylaxis: Heparin Code Status: Full code Family Communication: None at bedside Disposition Plan: SNF when medically stable   Consultants:   CCM/Pulmonology  Procedures:   7/13 PEG tube inserted   7/14 to ICU after vomit w/ aspiration - intubated   7/16 LE venous duplex - no DVT  1/91 TEE - no embolic source - negative bubble study - normal EF  7/18 extubated >recurrent aspiration >re-intubated   7/18 bronchoscopy for culture  7/20 tracheostomy  Antimicrobials:  Zosyn  Fluconazole    Subjective: Afebrile  Objective: Vitals:   11/22/16 1231 11/22/16 1357 11/22/16 1641 11/22/16 1825  BP:  120/60  138/70  Pulse: 87 84 89 94  Resp: _0 Temp:  97.9 F (36.6 C)  98.1 F (36.7 C)  TempSrc:  Axillary  Oral  SpO2: 97% 100% 99% (!) 66%  Weight:      Height:        Intake/Output Summary (Last 24 hours) at  11/22/16 1840 Last data filed at 11/22/16 1300  Gross per 24 hour  Intake               60 ml  Output             1750 ml  Net            -1690 ml   Filed Weights   11/20/16 0627 11/21/16 0455 11/22/16 0500  Weight: 68.1 kg (150 lb 2.1 oz) 70 kg (154 lb 5.2 oz) 70.8 kg (156 lb 1.6 oz)    Examination:  General exam: Appears calm and comfortable  Respiratory system: diminished to auscultation bilaterally. Respiratory effort normal. Cardiovascular system: S1 & S2 heard, RRR. No murmurs, rubs, gallops or clicks. Gastrointestinal system: Abdomen is nondistended, soft and nontender. Normal bowel sounds heard. Central nervous system: Alert Extremities: No edema. No calf tenderness Skin: No cyanosis. No rashes   Data Reviewed: I have personally reviewed following labs and imaging studies  CBC:  Recent Labs Lab 11/18/16 0355 11/19/16 0411 11/20/16 0254 11/21/16 0325 11/22/16 0502  WBC 17.7* 22.4* 23.5* 21.8* 23.0*  HGB 8.1* 8.2* 8.1* 7.5* 7.2*  HCT 25.9* 26.2* 25.5* 24.5* 23.4*  MCV 90.6 89.7 90.1 89.7 91.4  PLT 1,008* 1,152* 1,097* 991* 840*   Basic Metabolic Panel:  Recent Labs Lab 11/16/16 0347 11/17/16 0421 11/20/16 0254 11/21/16 0809 11/22/16 0502  NA 137 140 145 148* 150*  K 4.3 4.6 4.6 4.4 4.2  CL 104 104 106 110 113*  CO2 _0 GLUCOSE 115* 108* 127* 149* 153*  BUN 21* 28* 40* 38* 39*  CREATININE 0.96 0.97 1.41* 1.42* 1.45*  CALCIUM 10.0 10.3 10.4* 10.5* 11.0*   GFR: Estimated Creatinine Clearance: 50.2 mL/min (A) (by C-G formula based on SCr of 1.45 mg/dL (H)). Liver Function Tests: No results for input(s): AST, ALT, ALKPHOS, BILITOT, PROT, ALBUMIN in the last 168 hours. No results for input(s): LIPASE, AMYLASE in the last 168 hours. No results for input(s): AMMONIA in the last 168 hours. Coagulation Profile: No results for input(s): INR, PROTIME in the last 168 hours. Cardiac Enzymes: No results for input(s): CKTOTAL, CKMB, CKMBINDEX,  TROPONINI in the last 168 hours. BNP (last 3 results) No results for input(s): PROBNP in the last 8760 hours. HbA1C: No results for input(s): HGBA1C in the last 72 hours. CBG:  Recent Labs Lab 11/22/16 0001 11/22/16 0403 11/22/16 0742 11/22/16 1148 11/22/16 1616  GLUCAP 127* 125* 124* 139* 122*   Lipid Profile: No results for input(s): CHOL, HDL, LDLCALC, TRIG, CHOLHDL, LDLDIRECT in the last 72 hours. Thyroid Function Tests: No results for input(s): TSH, T4TOTAL, FREET4, T3FREE, THYROIDAB in the last 72 hours. Anemia Panel: No results for input(s): VITAMINB12, FOLATE, FERRITIN, TIBC, IRON, RETICCTPCT in the last 72 hours. Sepsis Labs: No results for input(s): PROCALCITON, LATICACIDVEN in the last 168 hours.  Recent Results (from the past 240 hour(s))  Culture, blood (routine x 2)     Status: None (Preliminary result)   Collection Time: 11/20/16 12:25 PM  Result Value Ref Range Status   Specimen Description BLOOD BLOOD RIGHT HAND  Final   Special Requests   Final    BOTTLES DRAWN AEROBIC ONLY Blood Culture adequate  volume   Culture NO GROWTH 2 DAYS  Final   Report Status PENDING  Incomplete  Culture, blood (routine x 2)     Status: None (Preliminary result)   Collection Time: 11/20/16 12:30 PM  Result Value Ref Range Status   Specimen Description BLOOD BLOOD RIGHT ARM  Final   Special Requests   Final    BOTTLES DRAWN AEROBIC ONLY Blood Culture adequate volume   Culture NO GROWTH 2 DAYS  Final   Report Status PENDING  Incomplete         Radiology Studies: No results found.      Scheduled Meds: . amLODipine  10 mg Per Tube Daily  . aspirin  325 mg Per Tube Daily  . chlorhexidine gluconate (MEDLINE KIT)  15 mL Mouth Rinse BID  . cloNIDine  0.1 mg Per Tube BID  . clopidogrel  75 mg Per Tube Daily  . famotidine  20 mg Per Tube BID  . feeding supplement (JEVITY 1.5 CAL/FIBER)  1,000 mL Per Tube Q24H  . feeding supplement (PRO-STAT SUGAR FREE 64)  30 mL Per  Tube TID  . fluconazole  200 mg Per Tube q1800  . heparin subcutaneous  5,000 Units Subcutaneous Q8H  . insulin aspart  0-9 Units Subcutaneous Q4H  . mouth rinse  15 mL Mouth Rinse QID  . metoCLOPramide  10 mg Per Tube TID AC & HS  . metoprolol tartrate  100 mg Per Tube BID  . multivitamin with minerals  1 tablet Per Tube Daily  . nicotine  14 mg Transdermal Daily  . pravastatin  20 mg Per Tube q1800  . thiamine  100 mg Per Tube Daily   Continuous Infusions: . sodium chloride 50 mL/hr at 11/20/16 1314  . piperacillin-tazobactam (ZOSYN)  IV Stopped (11/22/16 1634)     LOS: 107 days     Cordelia Poche, MD [facility] 11/22/2016, 6:40 PM Pager: [phone]  If 7PM-7AM, please contact night-coverage [email or web address] Password [facility] 11/22/2016, 6:40 PM

## 2016-11-22 NOTE — Progress Notes (Signed)
Trach changed per MD order. #6 cuffed removed with slight resistance and #6 uncuffed was inserted with no complications. Positive color change noted on etco2, bilateral BS throughout, VS within normal limits. Pt stable throughout. Pt with strong productive cough post trach change with moderate amount of clear/pink tinged/tan secretions removed and pt was also tracheal suctioned x1 with small amount obtained as well. RT will continue to monitor.

## 2016-11-22 NOTE — Progress Notes (Signed)
CSW met with patient's sister at bedside to discuss updates on bed offers. Patient received denials from every [facility] facility [facility], [facility], [place], [facility]), and has offers at [facility], [facility], and [facility] in [place]. Patient's sister indicated that she would go this afternoon and look at the facilities, and make sure that she could navigate to them because the traffic on [address] is very stressful for her. CSW indicated to patient's sister that she would need to select a facility today, so that insurance authorization request could be initiated.  Patient's sister will contact CSW back with facility choice, and CSW will follow up to initiate insurance authorization.  Laveda Abbe, [place] Clinical Social Worker [phone]

## 2016-11-23 LAB — CBC
HCT: 25 % — ABNORMAL LOW (ref 39.0–52.0)
Hemoglobin: 7.5 g/dL — ABNORMAL LOW (ref 13.0–17.0)
MCH: 27.6 pg (ref 26.0–34.0)
MCHC: 30 g/dL (ref 30.0–36.0)
MCV: 91.9 fL (ref 78.0–100.0)
Platelets: 964 10*3/uL (ref 150–400)
RBC: 2.72 MIL/uL — ABNORMAL LOW (ref 4.22–5.81)
RDW: 15.9 % — ABNORMAL HIGH (ref 11.5–15.5)
WBC: 18.6 10*3/uL — ABNORMAL HIGH (ref 4.0–10.5)

## 2016-11-23 LAB — BASIC METABOLIC PANEL
Anion gap: 12 (ref 5–15)
Anion gap: 12 (ref 5–15)
BUN: 36 mg/dL — ABNORMAL HIGH (ref 6–20)
BUN: 38 mg/dL — ABNORMAL HIGH (ref 6–20)
CO2: 27 mmol/L (ref 22–32)
CO2: 27 mmol/L (ref 22–32)
Calcium: 11.2 mg/dL — ABNORMAL HIGH (ref 8.9–10.3)
Calcium: 11.6 mg/dL — ABNORMAL HIGH (ref 8.9–10.3)
Chloride: 114 mmol/L — ABNORMAL HIGH (ref 101–111)
Chloride: 115 mmol/L — ABNORMAL HIGH (ref 101–111)
Creatinine, Ser: 1.36 mg/dL — ABNORMAL HIGH (ref 0.61–1.24)
Creatinine, Ser: 1.33 mg/dL — ABNORMAL HIGH (ref 0.61–1.24)
GFR calc Af Amer: >60 mL/min (ref >60)
GFR calc Af Amer: >60 mL/min (ref >60)
GFR calc non Af Amer: 53 mL/min — ABNORMAL LOW (ref >60)
GFR calc non Af Amer: 54 mL/min — ABNORMAL LOW (ref >60)
Glucose, Bld: 128 mg/dL — ABNORMAL HIGH (ref 65–99)
Glucose, Bld: 132 mg/dL — ABNORMAL HIGH (ref 65–99)
Potassium: 4.2 mmol/L (ref 3.5–5.1)
Potassium: 4.4 mmol/L (ref 3.5–5.1)
Sodium: 153 mmol/L — ABNORMAL HIGH (ref 135–145)
Sodium: 154 mmol/L — ABNORMAL HIGH (ref 135–145)

## 2016-11-23 LAB — GLUCOSE, CAPILLARY
Glucose-Capillary: 113 mg/dL — ABNORMAL HIGH (ref 65–99)
Glucose-Capillary: 115 mg/dL — ABNORMAL HIGH (ref 65–99)
Glucose-Capillary: 116 mg/dL — ABNORMAL HIGH (ref 65–99)
Glucose-Capillary: 118 mg/dL — ABNORMAL HIGH (ref 65–99)
Glucose-Capillary: 119 mg/dL — ABNORMAL HIGH (ref 65–99)
Glucose-Capillary: 127 mg/dL — ABNORMAL HIGH (ref 65–99)

## 2016-11-23 MED ORDER — PIPERACILLIN-TAZOBACTAM 3.375 G IVPB
3.3750 g | Freq: Three times a day (TID) | INTRAVENOUS | Status: DC
  Administered 2016-11-23 – 2016-11-25 (×7): 3.375 g via INTRAVENOUS
  Filled 2016-11-23 (×7): qty 50

## 2016-11-23 NOTE — Progress Notes (Signed)
CSW contacted representative from [place] to discuss insurance authorization request, and received information from [facility] that they can no longer accept the patient. Facility was unaware that the patient had a trach, despite it being documented on the referral and pointed out to the facility; facility cannot accept patient's with trach.  CSW will follow up with patient's sister to determine another option for placement for the patient.   Laveda Abbe, [place] Clinical Social Worker [phone]

## 2016-11-23 NOTE — Progress Notes (Signed)
Patient had a 16 beat run  of svt (highest 150's) . MD notified. Will continue to monitor.

## 2016-11-23 NOTE — Progress Notes (Signed)
Orthopedic Tech Progress Note Patient Details:  Scott Rivera 11-16-1950 [id number]  Patient ID: Bridget Hartshorn, male   DOB: [birth year], 66 y.o.   MRN: [id number]   Hildred Priest 11/23/2016, 2:44 PM Called in bio-tech brace order; spoke with [facility]
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
## 2016-11-23 NOTE — Progress Notes (Signed)
CSW met with patient's sister to update her on [name] no longer being an option for facility placement. Patient's sister became upset and discussed her frustration at not being able to find a facility that she likes, that she doesn't want to just have to place the patient anywhere, she wants to be happy with it. CSW reminded patient's sister that the options are limited due to the patient's specialized equipment (trach) and that not every facility has the staff trained to be able to provide the care that the patient needs. Patient's sister questioned if it was because of his insurance, and wanted to make sure that CSW was aware that he had both Medicare and Medicaid. CSW explained how it wasn't a lack of insurance, it was due to the increased amount of care that the patient was going to require because of his trach.   Patient's sister requested that CSW check in on any facilities that may be available in [place] or [place]. CSW indicated that referrals would be sent out, but that patient's sister shouldn't be hopeful because the patient requires a high level of care; [place] will be the highest likelihood of having facilities that can accept the patient with the staffing needed. CSW indicated that the options would still likely be [facility] or [facility]. Patient's sister indicated that [facility] would be completely out of the question.  CSW will follow up with any additional bed offers received, and continue with insurance authorization request tomorrow when facility is chosen.  Laveda Abbe, [place] Clinical Social Worker [phone]

## 2016-11-23 NOTE — Progress Notes (Signed)
  Speech Language Pathology Treatment: [name] Speaking valve  Patient Details Name: CAIO DEVERA MRN: [id number] DOB: 07-26-1950 Today's Date: 11/23/2016 Time: 0233-4356 SLP Time Calculation (min) (ACUTE ONLY): 12 min  Assessment / Plan / Recommendation Clinical Impression  Pt is drowsy this morning, therefore PO trials were held. He did wear his PMV and made very breathy, soft phonation x2 with Max cues from therapist and sister. Phonation was minimal, but improved from previous sessions in which he has been aphonic. He had some audible wetness but no expectoration of his secretions despite cueing. He will need additional SLP f/u at SNF.   HPI HPI: 66 y.o. male with history of hypertension was brought to [facility] on 10/15/2016 after patient was found to be confused. As per the family who provided most of the history patient was not able to be raised and family went to check on him at home and was found on the floor confused. Patient was taken to the ER. The last contact was 2 days ago by patient's brother prior to the episode. At [facility] MRI showed multiple acute infarcts involving the both hemisphere of the cerebellum and right occipital lobe right midbrain and left thalamus the fourth ventricle was patent no hydrocephalus. 2-D echo showing EF of 55-60% normal LV size. Carotid Doppler was done which showed right ICA 40-59% stenosis with left 60-79% stenosis. Chest x-ray was showing infiltrates and patient also was febrile for which patient was started on antibiotics. Patient's creatinine at admission was 2.6 which improved to 1.5 the time of discharge. Patient also had rhabdomyolysis with CK of 7000 at the time of admission. As per the note patient was initially agitated requiring Haldol at the other hospital. Patient's mentation slowly improved gradually and family requested transfer to [facility] after discussing with neurologist at  [facility] Dr. Leonel Ramsay      SLP Plan  Continue with current plan of care       Recommendations  Diet recommendations: NPO Medication Administration: Via alternative means      Patient may use Passy-Muir Speech Valve: During all therapies with supervision;Intermittently with supervision PMSV Supervision: Full         Oral Care Recommendations: Oral care QID Follow up Recommendations: Skilled Nursing facility SLP Visit Diagnosis: Aphonia (R49.1) Plan: Continue with current plan of care       GO                [name] 11/23/2016, 12:30 PM  [name], M.A. CCC-SLP [phone]

## 2016-11-23 NOTE — Progress Notes (Signed)
CSW received confirmation from patient's sister that facility choice is [facility]. CSW has initiated insurance authorization through [employer]. CSW will need updated clinicals dated for today to fax over; alerted PT and OT.  CSW will follow up with additional information when available.  Laveda Abbe, [place] Clinical Social Worker [phone]

## 2016-11-23 NOTE — Progress Notes (Signed)
Pharmacy Antibiotic Note  Scott Rivera is a 66 y.o. male day #7 Zoysn since restarted for aspiration pneumonia.  Pharmacy assisted with antibiotic dosing earlier this admission for sepsis and HCAP.  Currently on day # 14 Fluconazole - yeast in urine culture 11/07/16.  Repeat culture ordered but no culture found.  Blood cultures sent 8/6, no growth to date.  WBC now trended down again. Tmax 99.2. CXR suggests pneumonia is clearing.  BUN/Creatinine trended up some from baseline, but Zosyn dose remains appropriate.   Plan:  Continue Zosyn 3.375 gm IV q8hrs (each over 4 hours).  Do not expect any need to adjust Zosyn dose for renal function.  Will follow clinical progress, length of therapy and/or transition to oral antibiotics.  Follow up repeat urine culture and Fluconazole stop date.    Height: _0  (180.3 cm) Weight: 150 lb 12.8 oz (68.4 kg) IBW/kg (Calculated) : 75.3  Temp (24hrs), Avg:98.5 F (36.9 C), Min:98.1 F (36.7 C), Max:99.2 F (37.3 C)   Recent Labs Lab 11/17/16 0421  11/19/16 0411 11/20/16 0254 11/21/16 0325 11/21/16 0809 11/22/16 0502 11/23/16 0521  WBC 18.4*  < > 22.4* 23.5* 21.8*  --  23.0* 18.6*  CREATININE 0.97  --   --  1.41*  --  1.42* 1.45* 1.36*  < > = values in this interval not displayed.  Estimated Creatinine Clearance: 51.7 mL/min (A) (by C-G formula based on SCr of 1.36 mg/dL (H)).    No Known Allergies  Antimicrobials this admission: Vancomycin 7/5>>7/6; 7/15 >>7/17 for sepsis Cefepime 7/5>>7/11 Augmentin 7/12 >> 7/14 Fluconazole IV 7/27>>per tube 7/31>> Zosyn 7/15 >>7/22 for sepsis; resumed 8/3 for aspiration pna >>  Dose adjustments this admission:   N/a  Microbiology results: 7/6MRSA PCR: negative 7/15 respiratory - normal flora 7/15 blood x 2 - negative 7/18 BAL - normal flora 7/23 blood x 2 - negative 7/24 urine - yeast  7/26 blood x 2 - negative 7/27 blood x 2 - negative  8/6 blood x 2 - no growth x 2 days so  far 8/6 urine - no specimen?  Thank you for allowing pharmacy to be a part of this patient's care.  Arty Baumgartner, [place] Pager: [phone] 11/23/2016 3:39 PM

## 2016-11-23 NOTE — Progress Notes (Signed)
PROGRESS NOTE    Scott Rivera  XID:[id number] DOB: Jul 31, 1950 DOA: 10/19/2016 PCP: Patient, No Pcp Per   Brief Narrative: Scott Rivera is a 66 y.o. male with past mental history of hypertension brought in by family to [facility] on 7/1 after being found down. At that time, MRI noted multiple acute infarcts involving both cerebellar hemispheres plus right occipital lobe, right midbrain and left thalamus. At time of admission, patient also noted to be in acute kidney injury plus rhabdomyolysis. Family requested transfer to [facility] and this was done on 7/5.  Patient's hospital course was noted for placement of PEG tube on 7/13 and then respiratory distress leading to intubation following aspiration event on 7/14. Patient extubated, but then had recurrent aspiration and reintubated on 7/18. Patient underwent tracheostomy on 7/20. Hospitalist resume care on 7/23 the patient overall has felt to be stable and moved up to the floor on 7/30.  No events overnight. Social work in process of finding a skilled nursing facility that accepts tracheostomy. Patient's mentation waxes and wanes along with alertness. Being followed by speech therapy. Currently not able to vocalize.   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 Management below  Aspiration pneumonia Recurrent. Significantly elevated  WBC trending down. -continue Zosyn -repeat CBC in am for leukocytosis  Tracheotomy S/p recurrent intubation from recurrent aspiration -pulmonology recommendations  Multiple acute strokes Bilateral posterior cerebral hemispheres. Dysarthria and left hemiparesis -continue DAPT for 3 months, followed by Plavix alone  Yeast in urine Treating.  Atrial septal aneurysm No embolism seen on TEE  Dysphagia PEG tube in place. -continue tube feeds  Ileus Resolved.  Hyperlipidemia -continue pravastatin  Hypertension Stable. -continue clonidine and amlodipine  Rhabdomyolysis Resolved with IV fluids  Acute kidney injury Improved and back up. Now stable.  Hypernatremia Possibly secondary to dehydration in setting of tube feeds and NS IV fluids -repeat BMP this afternoon -discontinue IV fluids  History of alcoholism Stable. No withdrawal  Transaminitis Mild. Resolved.  Severe malnutrition -nutrition recommendations -tube feeds  Tobacco abuse  Thrombocytosis Likely reactive in setting of severe illness. Stable. Pathologist review significant for thrombocytosis. -repeat CBC   DVT prophylaxis: Heparin Code Status: Full code Family Communication: None at bedside Disposition Plan: SNF when medically stable   Consultants:   CCM/Pulmonology  Procedures:   7/13 PEG tube inserted   7/14 to ICU after vomit w/ aspiration - intubated   7/16 LE venous duplex - no DVT  2/90 TEE - no embolic source - negative bubble study - normal EF  7/18 extubated >recurrent aspiration >re-intubated   7/18 bronchoscopy for culture  7/20 tracheostomy  Antimicrobials:  Zosyn  Fluconazole    Subjective: Afebrile  Objective: Vitals:   11/23/16 0451 11/23/16 0457 11/23/16 0905 11/23/16 1143  BP: (!) 134/52  (!) 130/42   Pulse: 96  95  97  Resp: 20  16 16   Temp: 98.3 F (36.8 C)  98.4 F (36.9 C)   TempSrc: Axillary  Axillary   SpO2: 92%  94% 97%  Weight:  68.4  kg (150 lb 12.8 oz)    Height:        Intake/Output Summary (Last 24 hours) at 11/23/16 1250 Last data filed at 11/23/16 0519  Gross per 24 hour  Intake                0 ml  Output             1550 ml  Net            -1550 ml   Filed Weights   11/21/16 0455 11/22/16 0500 11/23/16 0457  Weight: 70 kg (154 lb 5.2 oz) 70.8 kg (156 lb 1.6 oz) 68.4 kg (150 lb 12.8 oz)    Examination:  General exam: Appears calm and comfortable  Respiratory system: Diminished to auscultation bilaterally. Respiratory effort normal. Cardiovascular system: S1 & S2 heard, RRR. No murmurs, rubs, gallops or clicks. Gastrointestinal system: Abdomen is nondistended, soft and nontender. Normal bowel sounds heard. Central nervous system: Alert Extremities: No edema. No calf tenderness Skin: No cyanosis. No rashes   Data Reviewed: I have personally reviewed following labs and imaging studies  CBC:  Recent Labs Lab 11/19/16 0411 11/20/16 0254 11/21/16 0325 11/22/16 0502 11/23/16 0521  WBC 22.4* 23.5* 21.8* 23.0* 18.6*  HGB 8.2* 8.1* 7.5* 7.2* 7.5*  HCT 26.2* 25.5* 24.5* 23.4* 25.0*  MCV 89.7 90.1 89.7 91.4 91.9  PLT 1,152* 1,097* 991* 921* 573*   Basic Metabolic Panel:  Recent Labs Lab 11/17/16 0421 11/20/16 0254 11/21/16 0809 11/22/16 0502 11/23/16 0521  NA 140 145 148* 150* 153*  K 4.6 4.6 4.4 4.2 4.2  CL 104 106 110 113* 114*  CO2 26 27 28 27 27   GLUCOSE 108* 127* 149* 153* 128*  BUN 28* 40* 38* 39* 36*  CREATININE 0.97 1.41* 1.42* 1.45* 1.36*  CALCIUM 10.3 10.4* 10.5* 11.0* 11.2*   GFR: Estimated Creatinine Clearance: 51.7 mL/min (A) (by C-G formula based on SCr of 1.36 mg/dL (H)). Liver Function Tests: No results for input(s): AST, ALT, ALKPHOS, BILITOT, PROT, ALBUMIN in the last 168 hours. No results for input(s): LIPASE, AMYLASE in the last 168 hours. No results for input(s): AMMONIA in the last 168 hours. Coagulation Profile: No results for input(s): INR, PROTIME in the last  168 hours. Cardiac Enzymes: No results for input(s): CKTOTAL, CKMB, CKMBINDEX, TROPONINI in the last 168 hours. BNP (last 3 results) No results for input(s): PROBNP in the last 8760 hours. HbA1C: No results for input(s): HGBA1C in the last 72 hours. CBG:  Recent Labs Lab 11/22/16 2020 11/23/16 0057 11/23/16 0400 11/23/16 0726 11/23/16 1116  GLUCAP 115* 118* 127* 119* 116*   Lipid Profile: No results for input(s): CHOL, HDL, LDLCALC, TRIG, CHOLHDL, LDLDIRECT in the last 72 hours. Thyroid Function Tests: No results for input(s): TSH, T4TOTAL, FREET4, T3FREE, THYROIDAB in the last 72 hours. Anemia Panel: No results for input(s): VITAMINB12, FOLATE, FERRITIN, TIBC, IRON, RETICCTPCT in the last 72 hours. Sepsis Labs: No results for input(s): PROCALCITON, LATICACIDVEN in the last 168 hours.  Recent Results (from the past 240 hour(s))  Culture, blood (routine x 2)     Status: None (Preliminary result)   Collection Time: 11/20/16 12:25 PM  Result Value Ref Range Status   Specimen Description BLOOD BLOOD RIGHT HAND  Final  Special Requests   Final    BOTTLES DRAWN AEROBIC ONLY Blood Culture adequate volume   Culture NO GROWTH 2 DAYS  Final   Report Status PENDING  Incomplete  Culture, blood (routine x 2)     Status: None (Preliminary result)   Collection Time: 11/20/16 12:30 PM  Result Value Ref Range Status   Specimen Description BLOOD BLOOD RIGHT ARM  Final   Special Requests   Final    BOTTLES DRAWN AEROBIC ONLY Blood Culture adequate volume   Culture NO GROWTH 2 DAYS  Final   Report Status PENDING  Incomplete         Radiology Studies: No results found.      Scheduled Meds: . amLODipine  10 mg Per Tube Daily  . aspirin  325 mg Per Tube Daily  . chlorhexidine gluconate (MEDLINE KIT)  15 mL Mouth Rinse BID  . cloNIDine  0.1 mg Per Tube BID  . clopidogrel  75 mg Per Tube Daily  . famotidine  20 mg Per Tube BID  . feeding supplement (JEVITY 1.5 CAL/FIBER)   1,000 mL Per Tube Q24H  . feeding supplement (PRO-STAT SUGAR FREE 64)  30 mL Per Tube TID  . fluconazole  200 mg Per Tube q1800  . heparin subcutaneous  5,000 Units Subcutaneous Q8H  . insulin aspart  0-9 Units Subcutaneous Q4H  . mouth rinse  15 mL Mouth Rinse QID  . metoCLOPramide  10 mg Per Tube TID AC & HS  . metoprolol tartrate  100 mg Per Tube BID  . multivitamin with minerals  1 tablet Per Tube Daily  . nicotine  14 mg Transdermal Daily  . pravastatin  20 mg Per Tube q1800  . thiamine  100 mg Per Tube Daily   Continuous Infusions: . piperacillin-tazobactam (ZOSYN)  IV       LOS: 35 days     Cordelia Poche, MD [facility] 11/23/2016, 12:50 PM Pager: [phone]  If 7PM-7AM, please contact night-coverage [email or web address] Password [email or web address] 11/23/2016, 12:50 PM
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
## 2016-11-24 LAB — BASIC METABOLIC PANEL
Anion gap: 9 (ref 5–15)
Anion gap: 9 (ref 5–15)
BUN: 42 mg/dL — ABNORMAL HIGH (ref 6–20)
BUN: 43 mg/dL — ABNORMAL HIGH (ref 6–20)
CO2: 29 mmol/L (ref 22–32)
CO2: 30 mmol/L (ref 22–32)
Calcium: 11.4 mg/dL — ABNORMAL HIGH (ref 8.9–10.3)
Calcium: 11.5 mg/dL — ABNORMAL HIGH (ref 8.9–10.3)
Chloride: 116 mmol/L — ABNORMAL HIGH (ref 101–111)
Chloride: 117 mmol/L — ABNORMAL HIGH (ref 101–111)
Creatinine, Ser: 1.42 mg/dL — ABNORMAL HIGH (ref 0.61–1.24)
Creatinine, Ser: 1.4 mg/dL — ABNORMAL HIGH (ref 0.61–1.24)
GFR calc Af Amer: 58 mL/min — ABNORMAL LOW (ref >60)
GFR calc Af Amer: 59 mL/min — ABNORMAL LOW (ref >60)
GFR calc non Af Amer: 50 mL/min — ABNORMAL LOW (ref >60)
GFR calc non Af Amer: 51 mL/min — ABNORMAL LOW (ref >60)
Glucose, Bld: 124 mg/dL — ABNORMAL HIGH (ref 65–99)
Glucose, Bld: 112 mg/dL — ABNORMAL HIGH (ref 65–99)
Potassium: 4.5 mmol/L (ref 3.5–5.1)
Potassium: 4 mmol/L (ref 3.5–5.1)
Sodium: 154 mmol/L — ABNORMAL HIGH (ref 135–145)
Sodium: 156 mmol/L — ABNORMAL HIGH (ref 135–145)

## 2016-11-24 LAB — GLUCOSE, CAPILLARY
Glucose-Capillary: 109 mg/dL — ABNORMAL HIGH (ref 65–99)
Glucose-Capillary: 110 mg/dL — ABNORMAL HIGH (ref 65–99)
Glucose-Capillary: 120 mg/dL — ABNORMAL HIGH (ref 65–99)
Glucose-Capillary: 126 mg/dL — ABNORMAL HIGH (ref 65–99)
Glucose-Capillary: 137 mg/dL — ABNORMAL HIGH (ref 65–99)
Glucose-Capillary: 99 mg/dL (ref 65–99)

## 2016-11-24 LAB — CBC
HCT: 25 % — ABNORMAL LOW (ref 39.0–52.0)
Hemoglobin: 7.7 g/dL — ABNORMAL LOW (ref 13.0–17.0)
MCH: 28 pg (ref 26.0–34.0)
MCHC: 30.8 g/dL (ref 30.0–36.0)
MCV: 90.9 fL (ref 78.0–100.0)
Platelets: 977 10*3/uL (ref 150–400)
RBC: 2.75 MIL/uL — ABNORMAL LOW (ref 4.22–5.81)
RDW: 15.4 % (ref 11.5–15.5)
WBC: 18.3 10*3/uL — ABNORMAL HIGH (ref 4.0–10.5)

## 2016-11-24 LAB — PATHOLOGIST SMEAR REVIEW

## 2016-11-24 MED ORDER — FREE WATER
300.0000 mL | Freq: Four times a day (QID) | Status: DC
  Administered 2016-11-24 – 2016-11-29 (×20): 300 mL

## 2016-11-24 MED ORDER — SODIUM CHLORIDE 0.45 % IV SOLN
INTRAVENOUS | Status: DC
  Administered 2016-11-24 – 2016-11-25 (×2): via INTRAVENOUS

## 2016-11-24 NOTE — Progress Notes (Signed)
Nutrition Consult/Follow Up  DOCUMENTATION CODES:   Severe malnutrition in context of acute illness/injury  INTERVENTION:    Continue Jevity 1.5 formula at goal rate of 40 ml/hr with Prostat liquid protein 30 ml TID    Free water flushes at 300 ml QID  TF regimen + free water flushes providing 1740 kcals, 106 gm protein, 1929 ml water daily  NUTRITION DIAGNOSIS:   Malnutrition related to acute illness as evidenced by severe depletion of body fat, severe depletion of muscle mass, energy intake < or equal to 50% for > or equal to 5 days  Ongoing  GOAL:   Patient will meet greater than or equal to 90% of their needs   Met  MONITOR:   Labs, Weight trends, TF tolerance, Skin, I & O's  ASSESSMENT:   66 yo male with PMH of HTN, stroke who was admitted to [place] on 7/1 after family found him on the floor confused. MRI showed multiple acute infarcts. He was transferred to [facility] on 7/5 at family's request.   PEG tube placed 7/13 Bedside tracheostomy 7/20  Jevity 1.5 formula currently infusing at goal rate of 40 ml/hr via PEG tube. Medications reviewed and include MVI, thiamine and Reglan. RD consulted to add free water flushes given hypernatremia.    Labs reviewed. Na 154 (H). CBG's 120-137-109. S/p trach change to #6 Shiley cuffless 8/8. Dispo: SNF placement.  Diet Order:  Diet - low sodium heart healthy Diet NPO time specified  Skin:  Wound (see comment) (stage II to R buttocks, L shoulder; DTI to heels)  Last BM:  8/9  Height:   Ht Readings from Last 1 Encounters:  11/07/16 5' 11"  (1.803 m)   Weight: >> stable  Wt Readings from Last 1 Encounters:  11/24/16 156 lb 1.6 oz (70.8 kg)   Ideal Body Weight:  78.2 kg  BMI:  Body mass index is 21.77 kg/m.  Estimated Nutritional Needs:   Kcal:  1850-2050  Protein:  95-110 gm  Fluid:  1.8-2.0 L  EDUCATION NEEDS:   No education needs identified at this time  Arthur Holms, RD, LDN Pager #:  [phone] After-Hours Pager #: [phone]

## 2016-11-24 NOTE — Care Management Note (Signed)
Case Management Note  Patient Details  Name: Scott Rivera MRN: [id number] Date of Birth: 04-08-1951  Subjective/Objective:                    Action/Plan: Plan is for SNF when patient is medically ready. CM following.  Expected Discharge Date:  10/25/16               Expected Discharge Plan:  Skilled Nursing Facility  In-House Referral:  Clinical Social Work  Discharge planning Services  CM Consult  Post Acute Care Choice:  IP Rehab Choice offered to:     DME Arranged:    DME Agency:     HH Arranged:    [place] Agency:     Status of Service:  In process, will continue to follow  If discussed at Long Length of Stay Meetings, dates discussed:    Additional Comments:  Pollie Friar, RN 11/24/2016, 12:41 PM
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
## 2016-11-24 NOTE — Progress Notes (Addendum)
CSW completed research on potential other facilities that would accept the patient with the trach. Patient received denials from [facility] in [place] and [facility] in [place]. CSW received information that [facility] in [place] was the only facility in [place] that accepts patients with trachs. CSW contacted to confirm and has faxed referral; waiting to hear if the facility can accept.  CSW provided information on referrals sent and denials received to patient's sister, at bedside. CSW gave patient's sister information on [facility] to review, and will update if a bed offer is received so that she can go visit.  CSW again reminded patient's sister of bed offer at [facility] and even though he'd have to share a room at first, they could move him to a private room when available, and he would be able to receive appropriate care at the facility.  CSW also faxed updated clinical information to [facility]) for review for authorization to transition to SNF if ready over the weekend. [employer] representative indicated that in order to review authorization request, doctor's note will need to indicate that the patient is medically ready for discharge, and they will have no nurses on staff over the weekend to review updated information. [employer] will not be able to authorize transition to SNF until Monday, at the earliest.   CSW will continue to follow for medical readiness as well as determining facility preference and completing insurance authorization for transition to SNF.  Laveda Abbe, LCSW Clinical Social Worker [phone]   Update 5:19 PM  CSW contacted [facility] this afternoon to check on referral sent and determine if facility can offer a bed; left voicemail. Never received a call back. CSW will check back in on Monday to see if facility can accept patient.  Laveda Abbe, [place] Clinical Social Worker [phone]

## 2016-11-24 NOTE — Progress Notes (Signed)
PROGRESS NOTE    Scott Rivera  WJX:[id number] DOB: Jun 14, 1950 DOA: 10/19/2016 PCP: Scott Rivera, No Pcp Per   Brief Narrative: Scott Rivera is a 66 y.o. male with past mental history of hypertension brought in by family to [facility] on 7/1 after being found down. At that time, MRI noted multiple acute infarcts involving both cerebellar hemispheres plus right occipital lobe, right midbrain and left thalamus. At time of admission, Scott Rivera also noted to be in acute kidney injury plus rhabdomyolysis. Family requested transfer to [facility] and this was done on 7/5.  Scott Rivera's hospital course was noted for placement of PEG tube on 7/13 and then respiratory distress leading to intubation following aspiration event on 7/14. Scott Rivera extubated, but then had recurrent aspiration and reintubated on 7/18. Scott Rivera underwent tracheostomy on 7/20. Hospitalist resume care on 7/23 the Scott Rivera overall has felt to be stable and moved up to the floor on 7/30.  No events overnight. Social work in process of finding a skilled nursing facility that accepts tracheostomy. Scott Rivera's mentation waxes and wanes along with alertness. Being followed by speech therapy. Currently not able to vocalize.   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 Management below  Aspiration pneumonia Recurrent. Significantly elevated  WBC trending down. -continue Zosyn -repeat CBC   Tracheotomy S/p recurrent intubation from recurrent aspiration -pulmonology recommendations  Multiple acute strokes Bilateral posterior cerebral hemispheres. Dysarthria and left hemiparesis -continue DAPT for 3 months, followed by Plavix alone  Yeast in urine Treating.  Atrial septal aneurysm No embolism seen on TEE  Dysphagia PEG tube in place. -continue tube feeds  Ileus Resolved.  Hyperlipidemia -continue pravastatin  Hypertension Stable. -continue clonidine and amlodipine  Rhabdomyolysis Resolved with IV fluids  Acute kidney injury Improved initially, creeping back up, likely in setting of dehydration. -1/2 NS IV fluids  Hypernatremia Possibly secondary to dehydration in setting of tube feeds and NS IV fluids. Stable but significantly elevated -add 1/2 NS fluids -increase water with tube feeds  History of alcoholism Stable. No withdrawal  Transaminitis Mild. Resolved.  Severe malnutrition -nutrition recommendations -tube feeds  Tobacco abuse  Thrombocytosis Likely reactive in setting of severe illness. Stable. Pathologist review significant for thrombocytosis. Will likely creep down over time. -repeat CBC   DVT prophylaxis: Heparin Code Status: Full code Family Communication: None at bedside Disposition Plan: SNF when medically stable   Consultants:   CCM/Pulmonology  Procedures:   7/13 PEG tube inserted   7/14 to ICU after vomit w/ aspiration - intubated   7/16 LE venous duplex - no DVT  2/13 TEE - no embolic source - negative bubble study - normal EF  7/18 extubated >recurrent aspiration >re-intubated   7/18 bronchoscopy for culture  7/20 tracheostomy  Antimicrobials:  Zosyn  Fluconazole    Subjective: Afebrile  Objective: Vitals:   11/24/16 0500 11/24/16 0530  11/24/16 0700 11/24/16 0900  BP:  (!) 131/59  (!) 133/56  Pulse:  85 95 92  Resp:  18 18 18   Temp:   97.6 F (36.4 C)  98.1 F (36.7 C)  TempSrc:  Oral  Axillary  SpO2:  94% 95% 100%  Weight: 70.8 kg (156 lb 1.6 oz)     Height:        Intake/Output Summary (Last 24 hours) at 11/24/16 1017 Last data filed at 11/24/16 0500  Gross per 24 hour  Intake               50 ml  Output             1550 ml  Net            -1500 ml   Filed Weights   11/22/16 0500 11/23/16 0457 11/24/16 0500  Weight: 70.8 kg (156 lb 1.6 oz) 68.4 kg (150 lb 12.8 oz) 70.8 kg (156 lb 1.6 oz)    Examination:  General exam: Appears calm and comfortable  Respiratory system: Diminished to auscultation bilaterally. Respiratory effort normal. Cardiovascular system:Regular rate and rhythm. Normal S1 and S2. No heart murmurs present. No extra heart sounds Gastrointestinal system: Abdomen is nondistended, soft and nontender. Normal bowel sounds heard. Central nervous system: Alert Extremities: No edema. No calf tenderness Skin: No cyanosis. No rashes   Data Reviewed: I have personally reviewed following labs and imaging studies  CBC:  Recent Labs Lab 11/19/16 0411 11/20/16 0254 11/21/16 0325 11/22/16 0502 11/23/16 0521  WBC 22.4* 23.5* 21.8* 23.0* 18.6*  HGB 8.2* 8.1* 7.5* 7.2* 7.5*  HCT 26.2* 25.5* 24.5* 23.4* 25.0*  MCV 89.7 90.1 89.7 91.4 91.9  PLT 1,152* 1,097* 991* 921* 709*   Basic Metabolic Panel:  Recent Labs Lab 11/21/16 0809 11/22/16 0502 11/23/16 0521 11/23/16 1559 11/24/16 0248  NA 148* 150* 153* 154* 154*  K 4.4 4.2 4.2 4.4 4.5  CL 110 113* 114* 115* 116*  CO2 28 27 27 27 29   GLUCOSE 149* 153* 128* 132* 124*  BUN 38* 39* 36* 38* 42*  CREATININE 1.42* 1.45* 1.36* 1.33* 1.42*  CALCIUM 10.5* 11.0* 11.2* 11.6* 11.4*   GFR: Estimated Creatinine Clearance: 51.2 mL/min (A) (by C-G formula based on SCr of 1.42 mg/dL (H)). Liver Function Tests: No results for input(s): AST, ALT, ALKPHOS, BILITOT, PROT, ALBUMIN in the last 168 hours. No results for input(s): LIPASE, AMYLASE in the  last 168 hours. No results for input(s): AMMONIA in the last 168 hours. Coagulation Profile: No results for input(s): INR, PROTIME in the last 168 hours. Cardiac Enzymes: No results for input(s): CKTOTAL, CKMB, CKMBINDEX, TROPONINI in the last 168 hours. BNP (last 3 results) No results for input(s): PROBNP in the last 8760 hours. HbA1C: No results for input(s): HGBA1C in the last 72 hours. CBG:  Recent Labs Lab 11/23/16 1604 11/23/16 1946 11/24/16 0017 11/24/16 0406 11/24/16 0755  GLUCAP 113* 115* 126* 120* 137*   Lipid Profile: No results for input(s): CHOL, HDL, LDLCALC, TRIG, CHOLHDL, LDLDIRECT in the last 72 hours. Thyroid Function Tests: No results for input(s): TSH, T4TOTAL, FREET4, T3FREE, THYROIDAB in the last 72 hours. Anemia Panel: No results for input(s): VITAMINB12, FOLATE, FERRITIN, TIBC, IRON, RETICCTPCT in the last 72 hours. Sepsis Labs: No results for input(s): PROCALCITON, LATICACIDVEN in the last 168 hours.  Recent Results (from the past 240 hour(s))  Culture, blood (routine x 2)     Status: None (Preliminary result)   Collection Time: 11/20/16  12:25 PM  Result Value Ref Range Status   Specimen Description BLOOD BLOOD RIGHT HAND  Final   Special Requests   Final    BOTTLES DRAWN AEROBIC ONLY Blood Culture adequate volume   Culture NO GROWTH 3 DAYS  Final   Report Status PENDING  Incomplete  Culture, blood (routine x 2)     Status: None (Preliminary result)   Collection Time: 11/20/16 12:30 PM  Result Value Ref Range Status   Specimen Description BLOOD BLOOD RIGHT ARM  Final   Special Requests   Final    BOTTLES DRAWN AEROBIC ONLY Blood Culture adequate volume   Culture NO GROWTH 3 DAYS  Final   Report Status PENDING  Incomplete         Radiology Studies: No results found.      Scheduled Meds: . amLODipine  10 mg Per Tube Daily  . aspirin  325 mg Per Tube Daily  . chlorhexidine gluconate (MEDLINE KIT)  15 mL Mouth Rinse BID  . cloNIDine   0.1 mg Per Tube BID  . clopidogrel  75 mg Per Tube Daily  . famotidine  20 mg Per Tube BID  . feeding supplement (JEVITY 1.5 CAL/FIBER)  1,000 mL Per Tube Q24H  . feeding supplement (PRO-STAT SUGAR FREE 64)  30 mL Per Tube TID  . fluconazole  200 mg Per Tube q1800  . heparin subcutaneous  5,000 Units Subcutaneous Q8H  . insulin aspart  0-9 Units Subcutaneous Q4H  . mouth rinse  15 mL Mouth Rinse QID  . metoCLOPramide  10 mg Per Tube TID AC & HS  . metoprolol tartrate  100 mg Per Tube BID  . multivitamin with minerals  1 tablet Per Tube Daily  . nicotine  14 mg Transdermal Daily  . pravastatin  20 mg Per Tube q1800  . thiamine  100 mg Per Tube Daily   Continuous Infusions: . sodium chloride 75 mL/hr at 11/24/16 0936  . piperacillin-tazobactam (ZOSYN)  IV 3.375 g (11/24/16 0529)     LOS: 54 days     Cordelia Poche, MD [facility] 11/24/2016, 10:17 AM Pager: [phone]  If 7PM-7AM, please contact night-coverage [email or web address] Password [email or web address] 11/24/2016, 10:17 AM

## 2016-11-25 LAB — CULTURE, BLOOD (ROUTINE X 2)
Culture: NO GROWTH
Culture: NO GROWTH
Special Requests: ADEQUATE
Special Requests: ADEQUATE

## 2016-11-25 LAB — GLUCOSE, CAPILLARY
Glucose-Capillary: 116 mg/dL — ABNORMAL HIGH (ref 65–99)
Glucose-Capillary: 126 mg/dL — ABNORMAL HIGH (ref 65–99)
Glucose-Capillary: 97 mg/dL (ref 65–99)
Glucose-Capillary: 104 mg/dL — ABNORMAL HIGH (ref 65–99)
Glucose-Capillary: 97 mg/dL (ref 65–99)
Glucose-Capillary: 88 mg/dL (ref 65–99)
Glucose-Capillary: 112 mg/dL — ABNORMAL HIGH (ref 65–99)

## 2016-11-25 LAB — URINE CULTURE: Culture: <10000 — AB

## 2016-11-25 LAB — BASIC METABOLIC PANEL
Anion gap: 10 (ref 5–15)
BUN: 38 mg/dL — ABNORMAL HIGH (ref 6–20)
CO2: 27 mmol/L (ref 22–32)
Calcium: 11.3 mg/dL — ABNORMAL HIGH (ref 8.9–10.3)
Chloride: 111 mmol/L (ref 101–111)
Creatinine, Ser: 1.39 mg/dL — ABNORMAL HIGH (ref 0.61–1.24)
GFR calc Af Amer: 59 mL/min — ABNORMAL LOW (ref >60)
GFR calc non Af Amer: 51 mL/min — ABNORMAL LOW (ref >60)
Glucose, Bld: 132 mg/dL — ABNORMAL HIGH (ref 65–99)
Potassium: 4 mmol/L (ref 3.5–5.1)
Sodium: 148 mmol/L — ABNORMAL HIGH (ref 135–145)

## 2016-11-25 LAB — CBC
HCT: 24.6 % — ABNORMAL LOW (ref 39.0–52.0)
Hemoglobin: 7.7 g/dL — ABNORMAL LOW (ref 13.0–17.0)
MCH: 28.5 pg (ref 26.0–34.0)
MCHC: 31.3 g/dL (ref 30.0–36.0)
MCV: 91.1 fL (ref 78.0–100.0)
Platelets: 915 10*3/uL (ref 150–400)
RBC: 2.7 MIL/uL — ABNORMAL LOW (ref 4.22–5.81)
RDW: 15.5 % (ref 11.5–15.5)
WBC: 21.2 10*3/uL — ABNORMAL HIGH (ref 4.0–10.5)

## 2016-11-25 MED ORDER — METOPROLOL TARTRATE 25 MG/10 ML ORAL SUSPENSION
100.0000 mg | Freq: Two times a day (BID) | ORAL | Status: DC
  Administered 2016-11-25 – 2016-11-29 (×8): 100 mg
  Filled 2016-11-25 (×8): qty 40

## 2016-11-25 NOTE — Progress Notes (Signed)
PROGRESS NOTE    Scott Rivera  FYB:[id number] DOB: 1950/10/14 DOA: 10/19/2016 PCP: Patient, No Pcp Per   Brief Narrative: Scott Rivera is a 66 y.o. male with past mental history of hypertension brought in by family to [facility] on 7/1 after being found down. At that time, MRI noted multiple acute infarcts involving both cerebellar hemispheres plus right occipital lobe, right midbrain and left thalamus. At time of admission, patient also noted to be in acute kidney injury plus rhabdomyolysis. Family requested transfer to [facility] and this was done on 7/5.  Patient's hospital course was noted for placement of PEG tube on 7/13 and then respiratory distress leading to intubation following aspiration event on 7/14. Patient extubated, but then had recurrent aspiration and reintubated on 7/18. Patient underwent tracheostomy on 7/20. Hospitalist resume care on 7/23 the patient overall has felt to be stable and moved up to the floor on 7/30.  No events overnight. Social work in process of finding a skilled nursing facility that accepts tracheostomy. Patient's mentation waxes and wanes along with alertness. Being followed by speech therapy. Currently not able to vocalize.   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 Management below  Aspiration pneumonia Recurrent. Significantly elevated  WBC stable since admission. -discontinue Zosyn -repeat CBC intermittently  Tracheotomy S/p recurrent intubation from recurrent aspiration -pulmonology recommendations  Multiple acute strokes Bilateral posterior cerebral hemispheres. Dysarthria and left hemiparesis -continue DAPT for 3 months, followed by Plavix alone  Yeast in urine Completed course of diflucan.  Atrial septal aneurysm No embolism seen on TEE  Dysphagia PEG tube in place. -continue tube feeds  Ileus Resolved.  Hyperlipidemia -continue pravastatin  Hypertension Stable. -continue clonidine and amlodipine  Rhabdomyolysis Resolved with IV fluids  Acute kidney injury Improved with further IV hydration and addition of free water  Hypernatremia Improved with hypotonic saline and free water. -discontinue 1/2 NS fluids -continue free water with tube feeds  History of alcoholism Stable. No withdrawal  Transaminitis Mild. Resolved.  Severe malnutrition -nutrition recommendations -tube feeds  Tobacco abuse  Thrombocytosis Likely reactive in setting of severe illness. Stable. Pathologist review significant for thrombocytosis. Will likely creep down over time. -repeat CBC intermittently   DVT prophylaxis: Heparin Code Status: Full code Family Communication: None at bedside Disposition Plan: SNF when bed available   Consultants:   CCM/Pulmonology  Procedures:   7/13 PEG tube inserted   7/14 to ICU after vomit w/ aspiration - intubated   7/16 LE venous duplex - no DVT  5/27 TEE - no embolic source - negative bubble study - normal EF  7/18 extubated >recurrent aspiration >re-intubated   7/18 bronchoscopy for culture  7/20 tracheostomy  Antimicrobials:  Zosyn  Fluconazole    Subjective: Afebrile  Objective: Vitals:   11/25/16 0353 11/25/16 0540 11/25/16 0839 11/25/16 1042  BP:  Marland Kitchen)  117/58  (!) 125/57  Pulse: 84 86 85 88  Resp: 18 20 16 20   Temp:  98.5 F (36.9 C)   98 F (36.7 C)  TempSrc:  Oral  Oral  SpO2: 97% 99% 97% 98%  Weight:      Height:        Intake/Output Summary (Last 24 hours) at 11/25/16 1114 Last data filed at 11/25/16 0543  Gross per 24 hour  Intake          2428.75 ml  Output             1500 ml  Net           928.75 ml   Filed Weights   11/22/16 0500 11/23/16 0457 11/24/16 0500  Weight: 70.8 kg (156 lb 1.6 oz) 68.4 kg (150 lb 12.8 oz) 70.8 kg (156 lb 1.6 oz)    Examination:  General exam: Appears calm and comfortable  Respiratory system: Diminished to auscultation bilaterally. Respiratory effort normal. Cardiovascular system: Regular rate and rhythm. Normal S1 and S2. No heart murmurs present. No extra heart sounds Gastrointestinal system: Abdomen is nondistended, soft and nontender. Normal bowel sounds heard. Central nervous system: Alert Extremities: No edema. No calf tenderness Skin: No cyanosis. No rashes   Data Reviewed: I have personally reviewed following labs and imaging studies  CBC:  Recent Labs Lab 11/21/16 0325 11/22/16 0502 11/23/16 0521 11/24/16 1542 11/25/16 0434  WBC 21.8* 23.0* 18.6* 18.3* 21.2*  HGB 7.5* 7.2* 7.5* 7.7* 7.7*  HCT 24.5* 23.4* 25.0* 25.0* 24.6*  MCV 89.7 91.4 91.9 90.9 91.1  PLT 991* 921* 964* 977* 893*   Basic Metabolic Panel:  Recent Labs Lab 11/23/16 0521 11/23/16 1559 11/24/16 0248 11/24/16 1542 11/25/16 0949  NA 153* 154* 154* 156* 148*  K 4.2 4.4 4.5 4.0 4.0  CL 114* 115* 116* 117* 111  CO2 27 27 29 30 27   GLUCOSE 128* 132* 124* 112* 132*  BUN 36* 38* 42* 43* 38*  CREATININE 1.36* 1.33* 1.42* 1.40* 1.39*  CALCIUM 11.2* 11.6* 11.4* 11.5* 11.3*   GFR: Estimated Creatinine Clearance: 52.4 mL/min (A) (by C-G formula based on SCr of 1.39 mg/dL (H)). Liver Function Tests: No results for input(s): AST, ALT, ALKPHOS, BILITOT, PROT, ALBUMIN in the last 168 hours. No results for input(s): LIPASE, AMYLASE in the last 168 hours. No results for input(s): AMMONIA in  the last 168 hours. Coagulation Profile: No results for input(s): INR, PROTIME in the last 168 hours. Cardiac Enzymes: No results for input(s): CKTOTAL, CKMB, CKMBINDEX, TROPONINI in the last 168 hours. BNP (last 3 results) No results for input(s): PROBNP in the last 8760 hours. HbA1C: No results for input(s): HGBA1C in the last 72 hours. CBG:  Recent Labs Lab 11/24/16 1631 11/24/16 2014 11/25/16 0032 11/25/16 0421 11/25/16 0724  GLUCAP 110* 99 126* 116* 97   Lipid Profile: No results for input(s): CHOL, HDL, LDLCALC, TRIG, CHOLHDL, LDLDIRECT in the last 72 hours. Thyroid Function Tests: No results for input(s): TSH, T4TOTAL, FREET4, T3FREE, THYROIDAB in the last 72 hours. Anemia Panel: No results for input(s): VITAMINB12, FOLATE, FERRITIN, TIBC, IRON, RETICCTPCT in the last 72 hours. Sepsis Labs: No results for input(s): PROCALCITON, LATICACIDVEN in the last 168 hours.  Recent Results (from the past 240 hour(s))  Culture, blood (routine x 2)     Status: None (Preliminary result)   Collection Time: 11/20/16 12:25 PM  Result Value Ref Range Status   Specimen Description BLOOD BLOOD RIGHT HAND  Final  Special Requests   Final    BOTTLES DRAWN AEROBIC ONLY Blood Culture adequate volume   Culture NO GROWTH 4 DAYS  Final   Report Status PENDING  Incomplete  Culture, blood (routine x 2)     Status: None (Preliminary result)   Collection Time: 11/20/16 12:30 PM  Result Value Ref Range Status   Specimen Description BLOOD BLOOD RIGHT ARM  Final   Special Requests   Final    BOTTLES DRAWN AEROBIC ONLY Blood Culture adequate volume   Culture NO GROWTH 4 DAYS  Final   Report Status PENDING  Incomplete         Radiology Studies: No results found.      Scheduled Meds: . amLODipine  10 mg Per Tube Daily  . aspirin  325 mg Per Tube Daily  . chlorhexidine gluconate (MEDLINE KIT)  15 mL Mouth Rinse BID  . cloNIDine  0.1 mg Per Tube BID  . clopidogrel  75 mg Per Tube  Daily  . famotidine  20 mg Per Tube BID  . feeding supplement (JEVITY 1.5 CAL/FIBER)  1,000 mL Per Tube Q24H  . feeding supplement (PRO-STAT SUGAR FREE 64)  30 mL Per Tube TID  . free water  300 mL Per Tube QID  . heparin subcutaneous  5,000 Units Subcutaneous Q8H  . insulin aspart  0-9 Units Subcutaneous Q4H  . metoCLOPramide  10 mg Per Tube TID AC & HS  . metoprolol tartrate  100 mg Per Tube BID  . multivitamin with minerals  1 tablet Per Tube Daily  . nicotine  14 mg Transdermal Daily  . pravastatin  20 mg Per Tube q1800  . thiamine  100 mg Per Tube Daily   Continuous Infusions: . piperacillin-tazobactam (ZOSYN)  IV Stopped (11/25/16 0915)     LOS: 37 days     Cordelia Poche, MD [facility] 11/25/2016, 11:14 AM Pager: [phone]  If 7PM-7AM, please contact night-coverage [email or web address] Password [email or web address] 11/25/2016, 11:14 AM

## 2016-11-26 LAB — GLUCOSE, CAPILLARY
Glucose-Capillary: 100 mg/dL — ABNORMAL HIGH (ref 65–99)
Glucose-Capillary: 105 mg/dL — ABNORMAL HIGH (ref 65–99)
Glucose-Capillary: 111 mg/dL — ABNORMAL HIGH (ref 65–99)
Glucose-Capillary: 122 mg/dL — ABNORMAL HIGH (ref 65–99)
Glucose-Capillary: 146 mg/dL — ABNORMAL HIGH (ref 65–99)
Glucose-Capillary: 96 mg/dL (ref 65–99)

## 2016-11-26 LAB — BASIC METABOLIC PANEL
Anion gap: 9 (ref 5–15)
BUN: 39 mg/dL — ABNORMAL HIGH (ref 6–20)
CO2: 27 mmol/L (ref 22–32)
Calcium: 10.9 mg/dL — ABNORMAL HIGH (ref 8.9–10.3)
Chloride: 108 mmol/L (ref 101–111)
Creatinine, Ser: 1.34 mg/dL — ABNORMAL HIGH (ref 0.61–1.24)
GFR calc Af Amer: >60 mL/min (ref >60)
GFR calc non Af Amer: 54 mL/min — ABNORMAL LOW (ref >60)
Glucose, Bld: 116 mg/dL — ABNORMAL HIGH (ref 65–99)
Potassium: 3.8 mmol/L (ref 3.5–5.1)
Sodium: 144 mmol/L (ref 135–145)

## 2016-11-26 LAB — CBC
HCT: 23.5 % — ABNORMAL LOW (ref 39.0–52.0)
Hemoglobin: 7.2 g/dL — ABNORMAL LOW (ref 13.0–17.0)
MCH: 27.4 pg (ref 26.0–34.0)
MCHC: 30.6 g/dL (ref 30.0–36.0)
MCV: 89.4 fL (ref 78.0–100.0)
Platelets: 917 10*3/uL (ref 150–400)
RBC: 2.63 MIL/uL — ABNORMAL LOW (ref 4.22–5.81)
RDW: 15.2 % (ref 11.5–15.5)
WBC: 20.3 10*3/uL — ABNORMAL HIGH (ref 4.0–10.5)

## 2016-11-26 NOTE — Progress Notes (Signed)
PROGRESS NOTE    Scott Rivera  GGE:[id number] DOB: 11/17/1950 DOA: 10/19/2016 PCP: Patient, No Pcp Per   Brief Narrative: Scott Rivera is a 66 y.o. male with past mental history of hypertension brought in by family to [facility] on 7/1 after being found down. At that time, MRI noted multiple acute infarcts involving both cerebellar hemispheres plus right occipital lobe, right midbrain and left thalamus. At time of admission, patient also noted to be in acute kidney injury plus rhabdomyolysis. Family requested transfer to [facility] and this was done on 7/5.  Patient's hospital course was noted for placement of PEG tube on 7/13 and then respiratory distress leading to intubation following aspiration event on 7/14. Patient extubated, but then had recurrent aspiration and reintubated on 7/18. Patient underwent tracheostomy on 7/20. Hospitalist resume care on 7/23 the patient overall has felt to be stable and moved up to the floor on 7/30.  No events overnight. Social work in process of finding a skilled nursing facility that accepts tracheostomy. Patient's mentation waxes and wanes along with alertness. Being followed by speech therapy. Currently not able to vocalize.   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 Management below  Aspiration pneumonia Recurrent. Significantly elevated  WBC stable since admission. Completed course of zosyn -repeat CBC intermittently  Tracheotomy S/p recurrent intubation from recurrent aspiration -pulmonology recommendations  Multiple acute strokes Bilateral posterior cerebral hemispheres. Dysarthria and left hemiparesis -continue DAPT for 3 months, followed by Plavix alone  Yeast in urine Completed course of diflucan.  Atrial septal aneurysm No embolism seen on TEE  Dysphagia PEG tube in place. -continue tube feeds with free water  Ileus Resolved.  Hyperlipidemia -continue pravastatin  Hypertension Stable. -continue clonidine and amlodipine  Rhabdomyolysis Resolved with IV fluids  Acute kidney injury Resolved. Creatine stable.  Hypernatremia Resolved with free water -continue free water with tube feeds  History of alcoholism Stable. No withdrawal  Transaminitis Mild. Resolved.  Severe malnutrition -nutrition recommendations -tube feeds  Tobacco abuse  Thrombocytosis Likely reactive in setting of severe illness. Stable. Pathologist review significant for thrombocytosis. Will likely creep down over time. -repeat CBC intermittently -consult hematology in AM pending repeat pathology smear   DVT prophylaxis: Heparin Code Status: Full code Family Communication: None at bedside Disposition Plan: SNF when bed available   Consultants:   CCM/Pulmonology  Procedures:   7/13 PEG tube inserted   7/14 to ICU after vomit w/ aspiration - intubated   7/16 LE venous duplex - no DVT  4/65 TEE - no embolic source - negative bubble study - normal EF  7/18 extubated >recurrent aspiration >re-intubated   7/18 bronchoscopy for culture  7/20 tracheostomy  Antimicrobials:  Zosyn  Fluconazole    Subjective: Afebrile  Objective: Vitals:   11/26/16 0356 11/26/16 0530 11/26/16 0928 11/26/16 1026  BP:  (!) 132/54  Marland Kitchen)  128/52  Pulse: 82 98 98 97  Resp: 18 20 20 17   Temp:  97.9 F (36.6 C)  98.2  F (36.8 C)  TempSrc:  Axillary  Oral  SpO2: 96% 96% 95% 95%  Weight:      Height:        Intake/Output Summary (Last 24 hours) at 11/26/16 1129 Last data filed at 11/26/16 0600  Gross per 24 hour  Intake                0 ml  Output             1400 ml  Net            -1400 ml   Filed Weights   11/22/16 0500 11/23/16 0457 11/24/16 0500  Weight: 70.8 kg (156 lb 1.6 oz) 68.4 kg (150 lb 12.8 oz) 70.8 kg (156 lb 1.6 oz)    Examination:  General exam: Appears calm and comfortable  Respiratory system:  Respiratory effort normal Central nervous system: Sleeping   Data Reviewed: I have personally reviewed following labs and imaging studies  CBC:  Recent Labs Lab 11/22/16 0502 11/23/16 0521 11/24/16 1542 11/25/16 0434 11/26/16 0704  WBC 23.0* 18.6* 18.3* 21.2* 20.3*  HGB 7.2* 7.5* 7.7* 7.7* 7.2*  HCT 23.4* 25.0* 25.0* 24.6* 23.5*  MCV 91.4 91.9 90.9 91.1 89.4  PLT 921* 964* 977* 915* 502*   Basic Metabolic Panel:  Recent Labs Lab 11/23/16 1559 11/24/16 0248 11/24/16 1542 11/25/16 0949 11/26/16 0704  NA 154* 154* 156* 148* 144  K 4.4 4.5 4.0 4.0 3.8  CL 115* 116* 117* 111 108  CO2 27 29 30 27 27   GLUCOSE 132* 124* 112* 132* 116*  BUN 38* 42* 43* 38* 39*  CREATININE 1.33* 1.42* 1.40* 1.39* 1.34*  CALCIUM 11.6* 11.4* 11.5* 11.3* 10.9*   GFR: Estimated Creatinine Clearance: 54.3 mL/min (A) (by C-G formula based on SCr of 1.34 mg/dL (H)). Liver Function Tests: No results for input(s): AST, ALT, ALKPHOS, BILITOT, PROT, ALBUMIN in the last 168 hours. No results for input(s): LIPASE, AMYLASE in the last 168 hours. No results for input(s): AMMONIA in the last 168 hours. Coagulation Profile: No results for input(s): INR, PROTIME in the last 168 hours. Cardiac Enzymes: No results for input(s): CKTOTAL, CKMB, CKMBINDEX, TROPONINI in the last 168 hours. BNP (last 3 results) No results for input(s): PROBNP in the last 8760 hours. HbA1C: No results for input(s):  HGBA1C in the last 72 hours. CBG:  Recent Labs Lab 11/25/16 1642 11/25/16 2002 11/25/16 2332 11/26/16 0408 11/26/16 0749  GLUCAP 97 88 112* 111* 122*   Lipid Profile: No results for input(s): CHOL, HDL, LDLCALC, TRIG, CHOLHDL, LDLDIRECT in the last 72 hours. Thyroid Function Tests: No results for input(s): TSH, T4TOTAL, FREET4, T3FREE, THYROIDAB in the last 72 hours. Anemia Panel: No results for input(s): VITAMINB12, FOLATE, FERRITIN, TIBC, IRON, RETICCTPCT in the last 72 hours. Sepsis Labs: No results for input(s): PROCALCITON, LATICACIDVEN in the last 168 hours.  Recent Results (from the past 240 hour(s))  Culture, blood (routine x 2)     Status: None   Collection Time: 11/20/16 12:25 PM  Result Value Ref Range Status   Specimen Description BLOOD BLOOD RIGHT HAND  Final   Special Requests   Final    BOTTLES DRAWN AEROBIC ONLY Blood Culture adequate volume   Culture NO GROWTH 5 DAYS  Final   Report Status 11/25/2016 FINAL  Final  Culture, blood (routine x 2)  Status: None   Collection Time: 11/20/16 12:30 PM  Result Value Ref Range Status   Specimen Description BLOOD BLOOD RIGHT ARM  Final   Special Requests   Final    BOTTLES DRAWN AEROBIC ONLY Blood Culture adequate volume   Culture NO GROWTH 5 DAYS  Final   Report Status 11/25/2016 FINAL  Final  Culture, Urine     Status: Abnormal   Collection Time: 11/24/16  2:43 PM  Result Value Ref Range Status   Specimen Description URINE, CATHETERIZED  Final   Special Requests NONE  Final   Culture <10,000 COLONIES/mL INSIGNIFICANT GROWTH (A)  Final   Report Status 11/25/2016 FINAL  Final         Radiology Studies: No results found.      Scheduled Meds: . amLODipine  10 mg Per Tube Daily  . aspirin  325 mg Per Tube Daily  . cloNIDine  0.1 mg Per Tube BID  . clopidogrel  75 mg Per Tube Daily  . famotidine  20 mg Per Tube BID  . feeding supplement (JEVITY 1.5 CAL/FIBER)  1,000 mL Per Tube Q24H  . feeding  supplement (PRO-STAT SUGAR FREE 64)  30 mL Per Tube TID  . free water  300 mL Per Tube QID  . heparin subcutaneous  5,000 Units Subcutaneous Q8H  . insulin aspart  0-9 Units Subcutaneous Q4H  . metoCLOPramide  10 mg Per Tube TID AC & HS  . metoprolol tartrate  100 mg Per Tube BID  . multivitamin with minerals  1 tablet Per Tube Daily  . nicotine  14 mg Transdermal Daily  . pravastatin  20 mg Per Tube q1800  . thiamine  100 mg Per Tube Daily   Continuous Infusions:    LOS: 38 days     Cordelia Poche, MD [facility] 11/26/2016, 11:29 AM Pager: [phone]  If 7PM-7AM, please contact night-coverage [email or web address] Password [facility] 11/26/2016, 11:29 AM
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
## 2016-11-27 ENCOUNTER — Inpatient Hospital Stay (HOSPITAL_COMMUNITY): Payer: Medicare HMO

## 2016-11-27 LAB — GLUCOSE, CAPILLARY
Glucose-Capillary: 115 mg/dL — ABNORMAL HIGH (ref 65–99)
Glucose-Capillary: 107 mg/dL — ABNORMAL HIGH (ref 65–99)
Glucose-Capillary: 92 mg/dL (ref 65–99)
Glucose-Capillary: 96 mg/dL (ref 65–99)
Glucose-Capillary: 104 mg/dL — ABNORMAL HIGH (ref 65–99)

## 2016-11-27 NOTE — Care Management Note (Signed)
Case Management Note  Patient Details  Name: Scott Rivera MRN: [id number] Date of Birth: 1951/04/12  Subjective/Objective:                    Action/Plan: Plan is for SNF when medically ready. CM following.   Expected Discharge Date:  10/25/16               Expected Discharge Plan:  Skilled Nursing Facility  In-House Referral:  Clinical Social Work  Discharge planning Services  CM Consult  Post Acute Care Choice:  IP Rehab Choice offered to:     DME Arranged:    DME Agency:     HH Arranged:    [place] Agency:     Status of Service:  In process, will continue to follow  If discussed at Long Length of Stay Meetings, dates discussed:    Additional Comments:  Pollie Friar, RN 11/27/2016, 10:24 AM

## 2016-11-27 NOTE — Progress Notes (Signed)
PROGRESS NOTE    Scott Rivera  WNU:[id number] DOB: 04/30/1950 DOA: 10/19/2016 PCP: Patient, No Pcp Per   Brief Narrative: Scott Rivera is a 66 y.o. male with past mental history of hypertension brought in by family to [facility] on 7/1 after being found down. At that time, MRI noted multiple acute infarcts involving both cerebellar hemispheres plus right occipital lobe, right midbrain and left thalamus. At time of admission, patient also noted to be in acute kidney injury plus rhabdomyolysis. Family requested transfer to [facility] and this was done on 7/5.  Patient's hospital course was noted for placement of PEG tube on 7/13 and then respiratory distress leading to intubation following aspiration event on 7/14. Patient extubated, but then had recurrent aspiration and reintubated on 7/18. Patient underwent tracheostomy on 7/20. Hospitalist resume care on 7/23 the patient overall has felt to be stable and moved up to the floor on 7/30.  No events overnight. Social work in process of finding a skilled nursing facility that accepts tracheostomy. Patient's mentation waxes and wanes along with alertness. Being followed by speech therapy. Currently not able to vocalize.   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 Management below  Aspiration pneumonia Recurrent. Significantly elevated  WBC stable since admission. Completed course of zosyn  Tracheotomy S/p recurrent intubation from recurrent aspiration  Multiple acute strokes Bilateral posterior cerebral hemispheres. Dysarthria and left hemiparesis -continue DAPT for 3 months, followed by Plavix alone  Yeast in urine Completed course of diflucan.  Atrial septal aneurysm No embolism seen on TEE  Dysphagia PEG tube in place. -continue tube feeds with free water  Ileus Resolved.  Hyperlipidemia -continue pravastatin  Hypertension Stable. -continue clonidine and amlodipine  Rhabdomyolysis Resolved with IV fluids  Acute kidney injury Resolved. Creatine stable.  Hypernatremia Resolved with free water -continue free water with tube feeds  History of alcoholism Stable. No withdrawal  Transaminitis Mild. Resolved.  Severe malnutrition -nutrition recommendations -tube feeds  Tobacco abuse  Thrombocytosis Likely reactive in setting of severe illness. Stable. Pathologist review significant for thrombocytosis. Will likely creep down over time. -repeat CBC intermittently -smear still pending   DVT prophylaxis: Heparin Code Status: Full code Family Communication: None at bedside Disposition Plan: SNF when bed available   Consultants:   CCM/Pulmonology  Procedures:   7/13 PEG tube inserted   7/14 to ICU after vomit w/ aspiration - intubated   7/16 LE venous duplex - no DVT  0/34 TEE - no embolic source - negative bubble study - normal EF  7/18 extubated >recurrent aspiration >re-intubated   7/18 bronchoscopy for culture  7/20 tracheostomy  Antimicrobials:  Zosyn  Fluconazole    Subjective: Afebrile  Objective: Vitals:   11/27/16 0031 11/27/16 0408 11/27/16 0608 11/27/16 0730  BP: (!) 126/56  (!) 127/54   Pulse: 90 81 88 90  Resp: 20 15 20 20   Temp: 98.7 F (37.1 C)  98.2 F (36.8 C)   TempSrc: Axillary  Axillary   SpO2: 98% 97% 99% 97%  Weight:      Height:         Intake/Output Summary (Last 24 hours) at 11/27/16 0946 Last data filed at 11/27/16 3570  Gross per 24 hour  Intake             1980 ml  Output             1950 ml  Net               30 ml   Filed Weights   11/22/16 0500 11/23/16 0457 11/24/16 0500  Weight: 70.8 kg (156 lb 1.6 oz) 68.4 kg (150 lb 12.8 oz) 70.8 kg (156 lb 1.6 oz)    Examination:  General exam: Appears calm and comfortable  Respiratory system:  Respiratory effort normal. Clear to auscultation. Cardiovascular: Regular rate and rhythm. Normal S1 and S2. No heart murmurs present. No extra heart sounds Central nervous system: Sleeping   Data Reviewed: I have personally reviewed following labs and imaging studies  CBC:  Recent Labs Lab 11/22/16 0502 11/23/16 0521 11/24/16 1542 11/25/16 0434 11/26/16 0704  WBC 23.0* 18.6* 18.3* 21.2* 20.3*  HGB 7.2* 7.5* 7.7* 7.7* 7.2*  HCT 23.4* 25.0* 25.0* 24.6* 23.5*  MCV 91.4 91.9 90.9 91.1 89.4  PLT 921* 964* 977* 915* 177*   Basic Metabolic Panel:  Recent Labs Lab 11/23/16 1559 11/24/16 0248 11/24/16 1542 11/25/16 0949 11/26/16 0704  NA 154* 154* 156* 148* 144  K 4.4 4.5 4.0 4.0 3.8  CL 115* 116* 117* 111 108  CO2 27 29 30 27 27   GLUCOSE 132* 124* 112* 132* 116*  BUN 38* 42* 43* 38* 39*  CREATININE 1.33* 1.42* 1.40* 1.39* 1.34*  CALCIUM 11.6* 11.4* 11.5* 11.3* 10.9*   GFR: Estimated Creatinine Clearance: 54.3 mL/min (A) (by C-G formula based on SCr of 1.34 mg/dL (H)). Liver Function Tests: No results for input(s): AST, ALT, ALKPHOS, BILITOT, PROT, ALBUMIN in the last 168 hours. No results for input(s): LIPASE, AMYLASE in the last 168 hours. No results for input(s): AMMONIA in the last 168 hours. Coagulation Profile: No results for input(s): INR, PROTIME in the last 168 hours. Cardiac Enzymes: No results for input(s): CKTOTAL, CKMB, CKMBINDEX, TROPONINI in the last 168 hours. BNP (last 3 results) No results for input(s): PROBNP in the last 8760  hours. HbA1C: No results for input(s): HGBA1C in the last 72 hours. CBG:  Recent Labs Lab 11/26/16 1612 11/26/16 1938 11/26/16 2355 11/27/16 0410 11/27/16 0727  GLUCAP 96 100* 105* 115* 107*   Lipid Profile: No results for input(s): CHOL, HDL, LDLCALC, TRIG, CHOLHDL, LDLDIRECT in the last 72 hours. Thyroid Function Tests: No results for input(s): TSH, T4TOTAL, FREET4, T3FREE, THYROIDAB in the last 72 hours. Anemia Panel: No results for input(s): VITAMINB12, FOLATE, FERRITIN, TIBC, IRON, RETICCTPCT in the last 72 hours. Sepsis Labs: No results for input(s): PROCALCITON, LATICACIDVEN in the last 168 hours.  Recent Results (from the past 240 hour(s))  Culture, blood (routine x 2)     Status: None   Collection Time: 11/20/16 12:25 PM  Result Value Ref Range Status   Specimen Description BLOOD BLOOD RIGHT HAND  Final   Special Requests   Final    BOTTLES DRAWN AEROBIC ONLY Blood Culture adequate volume   Culture NO GROWTH 5 DAYS  Final   Report  Status 11/25/2016 FINAL  Final  Culture, blood (routine x 2)     Status: None   Collection Time: 11/20/16 12:30 PM  Result Value Ref Range Status   Specimen Description BLOOD BLOOD RIGHT ARM  Final   Special Requests   Final    BOTTLES DRAWN AEROBIC ONLY Blood Culture adequate volume   Culture NO GROWTH 5 DAYS  Final   Report Status 11/25/2016 FINAL  Final  Culture, Urine     Status: Abnormal   Collection Time: 11/24/16  2:43 PM  Result Value Ref Range Status   Specimen Description URINE, CATHETERIZED  Final   Special Requests NONE  Final   Culture <10,000 COLONIES/mL INSIGNIFICANT GROWTH (A)  Final   Report Status 11/25/2016 FINAL  Final         Radiology Studies: No results found.      Scheduled Meds: . amLODipine  10 mg Per Tube Daily  . aspirin  325 mg Per Tube Daily  . cloNIDine  0.1 mg Per Tube BID  . clopidogrel  75 mg Per Tube Daily  . famotidine  20 mg Per Tube BID  . feeding supplement (JEVITY 1.5  CAL/FIBER)  1,000 mL Per Tube Q24H  . feeding supplement (PRO-STAT SUGAR FREE 64)  30 mL Per Tube TID  . free water  300 mL Per Tube QID  . heparin subcutaneous  5,000 Units Subcutaneous Q8H  . insulin aspart  0-9 Units Subcutaneous Q4H  . metoCLOPramide  10 mg Per Tube TID AC & HS  . metoprolol tartrate  100 mg Per Tube BID  . multivitamin with minerals  1 tablet Per Tube Daily  . nicotine  14 mg Transdermal Daily  . pravastatin  20 mg Per Tube q1800  . thiamine  100 mg Per Tube Daily   Continuous Infusions:    LOS: 39 days     Cordelia Poche, MD [facility] 11/27/2016, 9:46 AM Pager: [phone]  If 7PM-7AM, please contact night-coverage [email or web address] Password [facility] 11/27/2016, 9:46 AM

## 2016-11-27 NOTE — Progress Notes (Signed)
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  Transferred to [facility] on 7/5, rhabdomyolysis resolved, treated for aspiration with 7 days of antibiotics, had severe dysarthria &dysphagia requiring PEG 7/13 and had another aspiration episode 7/15 requiring mechanical ventilation. Unfortunately, failed extubation on 7/18 and required reintubation, then trach.  Subjective:  On TC 28% SLP following, minimal vocalization, requiring full supervision with PMV Mentation waxes and wanes with alertness.   Awaiting SNF placement that accepts trachs  Vital signs: BP 130/68 (BP Location: Right Arm)   Pulse 89   Temp 97.9 F (36.6 C) (Oral)   Resp 20   Ht 5\' 11"  (1.803 m)   Wt 156 lb 1.6 oz (70.8 kg)   SpO2 95%   BMI 21.77 kg/m   Intake/output: I/O last 3 completed shifts: In: 1980 [NG/GT:1980] Out: 2850 [Urine:2850]  General: frail adult male, chronically ill appearing  HEENT: MM pink/moist, #6 cuffless trach midline c/d/i, minimal secretions Neuro: Awake, intermittently follows commands CV: s1s2 rrr, no m/r/g PULM: even/non-labored, lungs bilaterally clear, diminished in bases, cough not visualized JH:ERDE, non-tender, bsx4 active, peg tube  Extremities: warm/dry, no edema, bilateral heel boots in plase Skin: no rashes or lesions, healing scar on forehead    CMP Latest Ref Rng & Units 11/26/2016 11/25/2016 11/24/2016  Glucose 65 - 99 mg/dL 116(H) 132(H) 112(H)  BUN 6 - 20 mg/dL 39(H) 38(H) 43(H)  Creatinine 0.61 - 1.24 mg/dL 1.34(H) 1.39(H) 1.40(H)  Sodium 135 - 145 mmol/L 144 148(H) 156(H)  Potassium 3.5 - 5.1 mmol/L 3.8 4.0 4.0  Chloride 101 - 111 mmol/L 108 111 117(H)  CO2 22 - 32 mmol/L 27 27 30   Calcium 8.9 - 10.3 mg/dL 10.9(H) 11.3(H) 11.5(H)  Total Protein 6.5 - 8.1 g/dL - - -  Total  Bilirubin 0.3 - 1.2 mg/dL - - -  Alkaline Phos 38 - 126 U/L - - -  AST 15 - 41 U/L - - -  ALT 17 - 63 U/L - - -     CBC Latest Ref Rng & Units 11/26/2016 11/25/2016 11/24/2016  WBC 4.0 - 10.5 K/uL 20.3(H) 21.2(H) 18.3(H)  Hemoglobin 13.0 - 17.0 g/dL 7.2(L) 7.7(L) 7.7(L)  Hematocrit 39.0 - 52.0 % 23.5(L) 24.6(L) 25.0(L)  Platelets 150 - 400 K/uL 917(HH) 915(HH) 977(HH)     ABG    Component Value Date/Time   PHART 7.499 (H) 11/07/2016 1254   PCO2ART 34.1 11/07/2016 1254   PO2ART 64.0 (L) 11/07/2016 1254   HCO3 26.4 11/07/2016 1254   TCO2 27 11/07/2016 1254   ACIDBASEDEF 2.0 10/29/2016 0905   O2SAT 94.0 11/07/2016 1254    CBG (last 3)   Recent Labs  11/26/16 2355 11/27/16 0410 11/27/16 0727  GLUCAP 105* 115* 107*    Imaging: No results found.  Lines/tubes: Lurline Idol 7/20 >> change to cuffless 8/7 >>   Assessment/plan:  Acute Hypoxemic Respiratory Failure - secondary to aspiration pneumonia in setting of CVA. Failure to wean s/p Tracheostomy - sutures out 7/29, change to cuffless 8/7 Aspiration risk/ Dysphagia   Plan: Continue #6 cuffless, would not consider downsizing due to immobility and generalized weakness ATC O2 as needed to support sats > 90% If he no longer requires O2, will likely need ATC for humidity Continue trach care, PRN suctioning PT/OT following SLP following, appreciate assistance > rec's for  PMV with supervision  MBS pending Pending SNF placement that accepts trachs when bed available  PCCM will continue to follow for trach needs.   [name], AGACNP-BC [facility] Pgr: [phone] or if no answer [phone] 11/27/2016, 10:11 AM  Attending Note:  I have examined patient, reviewed labs, studies and notes. I have discussed the case with B Simpson, and I agree with the data and plans as amended above. 66 year old man admitted with multiple strokes and associated respiratory failure. Now on trach collar and change to a cuffed  trach. He has a PEG in place and has not been able to tolerate oral meds or food. He has worked some speech therapy on PMV but little progress. On my evaluation he nodded to questions attempted to follow commands and tried to talk although he was incomprehensible. His lungs are clear. His PEG site is clean dry and intact. He has a strong cough that is nonproductive. At this point I would leave his Keflex trach in place and continue trach collar ad lib. Continue to work with speech therapy. Hopefully will be held tolerated diet in the near future. Tube feeding until he has accomplished this. No plans for decannulation at this time. Please call us for any questions regarding his tracheostomy.   Baltazar Apo, MD, PhD 11/27/2016, 2:31 PM [facility] [phone] or if no answer [phone]

## 2016-11-27 NOTE — Progress Notes (Signed)
  Speech Language Pathology Treatment: Dysphagia;Passy Muir Speaking valve  Patient Details Name: Scott Rivera MRN: [id number] DOB: Aug 21, 1950 Today's Date: 11/27/2016 Time: 0910-0940 SLP Time Calculation (min) (ACUTE ONLY): 30 min  Assessment / Plan / Recommendation Clinical Impression  Pt was alert and responsive this a.m. Pt tolerated PMV for 15-20 minutes without significant changes in vitals. Pt with productive cough when cued; worked up some secretions to oral cavity which were suctioned. Pt was mostly aphonic but with some attempts to imitate single words with moderate verbal/ visual cues from therapist; achieved vocalization when repeating the word "stop". Attempted trials of ice chips; pt appeared to initiate a pharyngeal swallow but had an immediate cough following all trials, concerning for aspiration. Pt would benefit from a MBS to objectively evaluate swallow function prior to d/c. Plan for MBS later this a.m. Will continue to follow.    HPI HPI:  66 y.o. male with history of hypertension was brought to [facility] on 10/15/2016 after patient was found to be confused. As per the family who provided most of the history patient was not able to be raised and family went to check on him at home and was found on the floor confused. Patient was taken to the ER. The last contact was 2 days ago by patient's brother prior to the episode. At [facility] MRI showed multiple acute infarcts involving the both hemisphere of the cerebellum and right occipital lobe right midbrain and left thalamus the fourth ventricle was patent no hydrocephalus. 2-D echo showing EF of 55-60% normal LV size. Carotid Doppler was done which showed right ICA 40-59% stenosis with left 60-79% stenosis. Chest x-ray was showing infiltrates and patient also was febrile for which patient was started on antibiotics. Patient's creatinine at admission was 2.6 which improved to 1.5 the time  of discharge. Patient also had rhabdomyolysis with CK of 7000 at the time of admission. As per the note patient was initially agitated requiring Haldol at the other hospital. Patient's mentation slowly improved gradually and family requested transfer to [facility] after discussing with neurologist at [facility] Dr. Leonel Ramsay      SLP Plan  MBS       Recommendations  Diet recommendations: NPO Medication Administration: Via alternative means      Patient may use Passy-Muir Speech Valve: Intermittently with supervision PMSV Supervision: Full         Oral Care Recommendations: Oral care QID Follow up Recommendations: Skilled Nursing facility SLP Visit Diagnosis: Dysphagia, unspecified (R13.10);[place] (R49.1) Plan: [place], [place], CCC-SLP 11/27/2016, 9:55 AM  [phone]
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
## 2016-11-27 NOTE — Progress Notes (Signed)
Modified Barium Swallow Progress Note  Patient Details  Name: Scott Rivera MRN: [id number] Date of Birth: 11/05/1950  Today's Date: 11/27/2016  Modified Barium Swallow completed.  Full report located under Chart Review in the Imaging Section.  Brief recommendations include the following:  Clinical Impression  Pt currently with a moderate-severe oropharyngeal dysphagia, characterized by reduced posterior propulsion of bolus, tongue pumping, and premature spill to the level of the pyriform sinuses; decreased base of tongue retraction/ pharyngeal squeeze/ hyolaryngeal excursion resulted in penetration of thin liquids and puree consistencies which appeared to remain above the vocal folds. Pt had significant post-swallow residuals in the vallecula, pyriform sinuses, and at the UES along with moderate residuals that remained in the oral cavity. It appeared that the pt's spinal curvature was also impacting UES opening and resulted in some backflow to the pharynx. Pt unable to follow verbal/ visual cues for a 2nd swallow. Larger boluses resulted in a more efficient swallow/ better clearance of residuals, although residuals were most significant with puree. Given these findings, pt remains at a severe risk of aspiration; recommend that pt remain NPO. Pt would benefit from continued dysphagia therapy; would recommend providing nectar-thick liquid trials with larger hand-over-hand cup sips to increase efficiency of swallow.    Swallow Evaluation Recommendations       SLP Diet Recommendations: NPO (trials of nectar with SLP only)       Medication Administration: Via alternative means               Oral Care Recommendations: Oral care QID        Kern Reap, MA, CCC-SLP 11/27/2016,12:27 PM  [phone]
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
## 2016-11-28 LAB — GLUCOSE, CAPILLARY
Glucose-Capillary: 106 mg/dL — ABNORMAL HIGH (ref 65–99)
Glucose-Capillary: 111 mg/dL — ABNORMAL HIGH (ref 65–99)
Glucose-Capillary: 120 mg/dL — ABNORMAL HIGH (ref 65–99)
Glucose-Capillary: 127 mg/dL — ABNORMAL HIGH (ref 65–99)
Glucose-Capillary: 129 mg/dL — ABNORMAL HIGH (ref 65–99)
Glucose-Capillary: 137 mg/dL — ABNORMAL HIGH (ref 65–99)
Glucose-Capillary: 99 mg/dL (ref 65–99)

## 2016-11-28 LAB — CBC
HCT: 23.3 % — ABNORMAL LOW (ref 39.0–52.0)
Hemoglobin: 7.3 g/dL — ABNORMAL LOW (ref 13.0–17.0)
MCH: 28 pg (ref 26.0–34.0)
MCHC: 31.3 g/dL (ref 30.0–36.0)
MCV: 89.3 fL (ref 78.0–100.0)
Platelets: 896 10*3/uL — ABNORMAL HIGH (ref 150–400)
RBC: 2.61 MIL/uL — ABNORMAL LOW (ref 4.22–5.81)
RDW: 15.4 % (ref 11.5–15.5)
WBC: 21.1 10*3/uL — ABNORMAL HIGH (ref 4.0–10.5)

## 2016-11-28 MED ORDER — FAMOTIDINE 40 MG/5ML PO SUSR: 20.0000 mg | Freq: Two times a day (BID) | ORAL | Status: DC

## 2016-11-28 MED ORDER — METOCLOPRAMIDE HCL 5 MG/5ML PO SOLN: 10.0000 mg | Freq: Three times a day (TID) | ORAL | Status: DC

## 2016-11-28 MED ORDER — THIAMINE HCL 100 MG PO TABS: 100.0000 mg | ORAL_TABLET | Freq: Every day | ORAL | Status: DC

## 2016-11-28 MED ORDER — CLONIDINE HCL 0.1 MG PO TABS: 0.1000 mg | ORAL_TABLET | Freq: Two times a day (BID) | ORAL | Status: DC

## 2016-11-28 MED ORDER — NICOTINE 7 MG/24HR TD PT24
7.0000 mg | MEDICATED_PATCH | Freq: Every day | TRANSDERMAL | Status: DC
  Administered 2016-11-29: 7 mg via TRANSDERMAL
  Filled 2016-11-28: qty 1

## 2016-11-28 MED ORDER — AMLODIPINE BESYLATE 10 MG PO TABS: 10.0000 mg | ORAL_TABLET | Freq: Every day | ORAL | Status: DC

## 2016-11-28 MED ORDER — PRO-STAT SUGAR FREE PO LIQD: 30.0000 mL | Freq: Three times a day (TID) | ORAL | Status: DC

## 2016-11-28 MED ORDER — JEVITY 1.5 CAL/FIBER PO LIQD: 1000.0000 mL | ORAL | Status: DC

## 2016-11-28 MED ORDER — ADULT MULTIVITAMIN W/MINERALS CH: 1.0000 | ORAL_TABLET | Freq: Every day | ORAL | Status: DC

## 2016-11-28 NOTE — Progress Notes (Signed)
CSW has obtained insurance authorization information through [facility]) for patient to admit to [facility]. CSW provided patient's sister information that patient would have a private room tomorrow, would only have to share a room for the night tonight. Patient's sister became upset, indicated that she did not want him to share a room at all, and would wait until tomorrow. CSW explained that the patient may end up getting the bill for the day today because the patient has been discharged from the hospital; patient's sister indicated understanding, but that she was not going to send him if he didn't have a private room.  CSW updated [facility] that patient's sister was not wanting him to admit until tomorrow. CSW alerted MD.   CSW will follow up to facilitate discharge for patient tomorrow.  Laveda Abbe, [place] Clinical Social Worker [phone]
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
## 2016-11-28 NOTE — Discharge Summary (Signed)
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 structure was also impacting UES opening and resulted in some backflow to the pharynx. Pt unable to follow verbal/ visual cues for a 2nd swallow. Larger boluses resulted in a more efficient swallow/ better clearance of residuals, although residuals were most significant with puree. Given these findings, pt remains at a severe risk of aspiration; recommend that pt remain NPO. Pt would benefit from continued dysphagia therapy; would recommend providing nectar-thick liquid trials with larger hand-over-hand cup sips to increase efficiency of swallow.  SLP Visit Diagnosis Dysphagia, oropharyngeal phase (R13.12) Attention and concentration deficit following -- Frontal lobe and executive function deficit following -- Impact on safety and function Severe aspiration risk;Risk for inadequate nutrition/hydration   CHL IP TREATMENT RECOMMENDATION 11/27/2016 Treatment Recommendations Therapy as outlined in treatment plan below   Prognosis  11/27/2016 Prognosis for Safe Diet Advancement Good Barriers to Reach Goals Cognitive deficits;Time post onset Barriers/Prognosis Comment -- CHL IP DIET RECOMMENDATION 11/27/2016 SLP Diet Recommendations NPO Liquid Administration via -- Medication Administration Via alternative means Compensations -- Postural Changes --   [facility] IP OTHER RECOMMENDATIONS 11/27/2016 Recommended Consults -- Oral Care Recommendations Oral care QID Other Recommendations --   [facility] IP FOLLOW UP RECOMMENDATIONS 11/27/2016 Follow up Recommendations Skilled Nursing facility   [facility] IP FREQUENCY AND DURATION 11/27/2016 Speech Therapy Frequency (ACUTE ONLY) min 3x week Treatment Duration 2 weeks      [facility] IP ORAL PHASE 11/27/2016 Oral Phase Impaired Oral - Pudding Teaspoon -- Oral - Pudding Cup -- Oral - Honey Teaspoon -- Oral - Honey Cup -- Oral - Nectar Teaspoon -- Oral - Nectar Cup -- Oral - Nectar Straw -- Oral - Thin Teaspoon Left anterior bolus loss;Right anterior bolus loss;Weak lingual manipulation;Reduced posterior propulsion;Lingual/palatal residue;Delayed oral transit;Premature spillage Oral - Thin Cup Left anterior bolus loss;Right anterior bolus loss;Weak lingual manipulation;Reduced posterior propulsion;Lingual/palatal residue;Delayed oral transit;Premature spillage Oral - Thin Straw -- Oral - Puree Weak lingual manipulation;Lingual pumping;Reduced posterior propulsion;Lingual/palatal residue;Delayed oral transit;Decreased bolus cohesion;Premature spillage Oral - Mech Soft -- Oral - Regular -- Oral - Multi-Consistency -- Oral - Pill -- Oral Phase - Comment --  CHL IP PHARYNGEAL PHASE 11/27/2016 Pharyngeal Phase Impaired Pharyngeal- Pudding Teaspoon -- Pharyngeal -- Pharyngeal- Pudding Cup -- Pharyngeal -- Pharyngeal- Honey Teaspoon -- Pharyngeal -- Pharyngeal-  Honey Cup -- Pharyngeal -- Pharyngeal- Nectar Teaspoon -- Pharyngeal -- Pharyngeal- Nectar Cup -- Pharyngeal -- Pharyngeal- Nectar Straw -- Pharyngeal -- Pharyngeal- Thin Teaspoon Delayed  swallow initiation-pyriform sinuses;Reduced pharyngeal peristalsis;Reduced epiglottic inversion;Reduced anterior laryngeal mobility;Reduced laryngeal elevation;Reduced tongue base retraction;Penetration/Aspiration during swallow;Pharyngeal residue - valleculae;Pharyngeal residue - pyriform;Pharyngeal residue - cp segment Pharyngeal Material enters airway, remains ABOVE vocal cords and not ejected out Pharyngeal- Thin Cup Delayed swallow initiation-pyriform sinuses;Reduced pharyngeal peristalsis;Reduced epiglottic inversion;Reduced anterior laryngeal mobility;Reduced laryngeal elevation;Reduced tongue base retraction;Penetration/Aspiration during swallow;Pharyngeal residue - valleculae;Pharyngeal residue - pyriform;Pharyngeal residue - cp segment Pharyngeal Material enters airway, remains ABOVE vocal cords and not ejected out Pharyngeal- Thin Straw -- Pharyngeal -- Pharyngeal- Puree Delayed swallow initiation-vallecula;Reduced pharyngeal peristalsis;Reduced epiglottic inversion;Reduced anterior laryngeal mobility;Reduced laryngeal elevation;Reduced tongue base retraction;Penetration/Aspiration during swallow;Pharyngeal residue - valleculae;Pharyngeal residue - pyriform;Pharyngeal residue - posterior pharnyx;Pharyngeal residue - cp segment Pharyngeal Material enters airway, remains ABOVE vocal cords and not ejected out Pharyngeal- Mechanical Soft -- Pharyngeal -- Pharyngeal- Regular -- Pharyngeal -- Pharyngeal- Multi-consistency -- Pharyngeal -- Pharyngeal- Pill -- Pharyngeal -- Pharyngeal Comment --  CHL IP CERVICAL ESOPHAGEAL PHASE 11/27/2016 Cervical Esophageal Phase Impaired Pudding Teaspoon -- Pudding Cup -- Honey Teaspoon -- Honey Cup -- Nectar Teaspoon -- Nectar Cup -- Nectar Straw -- Thin Teaspoon WFL Thin Cup Esophageal backflow into the pharynx Thin Straw -- Puree Esophageal backflow into the pharynx Mechanical Soft -- Regular -- Multi-consistency -- Pill -- Cervical Esophageal Comment -- CHL IP GO 10/24/2016  Functional Assessment Tool Used NOMS; clinical judgment Functional Limitations Swallowing Swallow Current Status (Y8657) CK Swallow Goal Status (Q4696) CJ Swallow Discharge Status (E9528) (None) Motor Speech Current Status (U1324) (None) Motor Speech Goal Status (M0102) (None) Motor Speech Goal Status (V2536) (None) Spoken Language Comprehension Current Status (U4403) (None) Spoken Language Comprehension Goal Status (K7425) (None) Spoken Language Comprehension Discharge Status (Z5638) (None) Spoken Language Expression Current Status (V5643) (None) Spoken Language Expression Goal Status (P2951) (None) Spoken Language Expression Discharge Status (O8416) (None) Attention Current Status (S0630) (None) Attention Goal Status (Z6010) (None) Attention Discharge Status (X3235) (None) Memory Current Status (T7322) (None) Memory Goal Status (G2542) (None) Memory Discharge Status (H0623) (None) Voice Current Status (J6283) (None) Voice Goal Status (T5176) (None) Voice Discharge Status (H6073) (None) Other Speech-Language Pathology Functional Limitation Current Status (X1062) (None) Other Speech-Language Pathology Functional Limitation Goal Status (I9485) (None) Other Speech-Language Pathology Functional Limitation Discharge Status [id number]) (None) Amy Dionicia Abler, MA, CCC-SLP 11/27/2016, 12:15 PM [id number]               Echocardiogram (10/21/2016)  Study Conclusions  - Left ventricle: The cavity size was normal. Wall thickness was   increased in a pattern of mild LVH. Systolic function was normal.   The estimated ejection fraction was in the range of 60% to 65%.   Wall motion was normal; there were no regional wall motion   abnormalities. - Atrial septum: The interatrial septum was hypermobile. A patent   foramen ovale cannot be excluded. 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 - Recommendations: Consider transesophageal echocardiography if   clinically indicated.  Recommendations:  Consider  transesophageal echocardiography if clinically indicated.  Transesophageal echocardiogram (11/01/2016)  Study Conclusions  - Left ventricle: The cavity size was normal. Wall thickness was   normal. Systolic function was normal. The estimated ejection   fraction was in the range of 60% to 65%. - Aortic valve: No evidence of vegetation. - Mitral valve: No evidence of vegetation. - Left atrium: No evidence of thrombus  in the appendage. - Right atrium: No evidence of thrombus in the atrial cavity or   appendage. - Atrial septum: The septum bowed from left to right, consistent   with increased left atrial pressure. No defect or patent foramen   ovale was identified. Echo contrast study showed no right-to-left   atrial level shunt, following an increase in RA pressure induced   by provocative maneuvers. - Tricuspid valve: No evidence of vegetation. - Pulmonic valve: No evidence of vegetation.   Subjective: Afebrile.  Discharge Exam: Vitals:   11/28/16 0856 11/28/16 1158  BP: 131/63   Pulse: (!) 118 89  Resp:  18  Temp:    SpO2:  96%   Vitals:   11/28/16 0525 11/28/16 0807 11/28/16 0856 11/28/16 1158  BP: 138/63  131/63   Pulse: (!) 108 (!) 110 (!) 118 89  Resp: 20 19  18   Temp: 99 F (37.2 C)     TempSrc: Axillary     SpO2: 98% 96%  96%  Weight:      Height:        General exam: Appears calm and comfortable  Respiratory system: Diminished to auscultation bilaterally. Respiratory effort normal. Trach Cardiovascular system:Regular rate and rhythm. Normal S1 and S2. No heart murmurs present. No extra heart sounds Gastrointestinal system: Abdomen is nondistended, soft and nontender. Normal bowel sounds heard. Central nervous system: Alert Extremities: No edema. No calf tenderness Skin: No cyanosis. No rashes  The results of significant diagnostics from this hospitalization (including imaging, microbiology, ancillary and laboratory) are listed below for reference.      Microbiology: Recent Results (from the past 240 hour(s))  Culture, blood (routine x 2)     Status: None   Collection Time: 11/20/16 12:25 PM  Result Value Ref Range Status   Specimen Description BLOOD BLOOD RIGHT HAND  Final   Special Requests   Final    BOTTLES DRAWN AEROBIC ONLY Blood Culture adequate volume   Culture NO GROWTH 5 DAYS  Final   Report Status 11/25/2016 FINAL  Final  Culture, blood (routine x 2)     Status: None   Collection Time: 11/20/16 12:30 PM  Result Value Ref Range Status   Specimen Description BLOOD BLOOD RIGHT ARM  Final   Special Requests   Final    BOTTLES DRAWN AEROBIC ONLY Blood Culture adequate volume   Culture NO GROWTH 5 DAYS  Final   Report Status 11/25/2016 FINAL  Final  Culture, Urine     Status: Abnormal   Collection Time: 11/24/16  2:43 PM  Result Value Ref Range Status   Specimen Description URINE, CATHETERIZED  Final   Special Requests NONE  Final   Culture <10,000 COLONIES/mL INSIGNIFICANT GROWTH (A)  Final   Report Status 11/25/2016 FINAL  Final     Labs: BNP (last 3 results) No results for input(s): BNP in the last 8760 hours. Basic Metabolic Panel:  Recent Labs Lab 11/23/16 1559 11/24/16 0248 11/24/16 1542 11/25/16 0949 11/26/16 0704  NA 154* 154* 156* 148* 144  K 4.4 4.5 4.0 4.0 3.8  CL 115* 116* 117* 111 108  CO2 27 29 30 27 27   GLUCOSE 132* 124* 112* 132* 116*  BUN 38* 42* 43* 38* 39*  CREATININE 1.33* 1.42* 1.40* 1.39* 1.34*  CALCIUM 11.6* 11.4* 11.5* 11.3* 10.9*   Liver Function Tests: No results for input(s): AST, ALT, ALKPHOS, BILITOT, PROT, ALBUMIN in the last 168 hours. No results for input(s): LIPASE, AMYLASE in the last 168  hours. No results for input(s): AMMONIA in the last 168 hours. CBC:  Recent Labs Lab 11/23/16 0521 11/24/16 1542 11/25/16 0434 11/26/16 0704 11/28/16 1024  WBC 18.6* 18.3* 21.2* 20.3* 21.1*  HGB 7.5* 7.7* 7.7* 7.2* 7.3*  HCT 25.0* 25.0* 24.6* 23.5* 23.3*  MCV 91.9 90.9 91.1  89.4 89.3  PLT 964* 977* 915* 917* 896*   Cardiac Enzymes: No results for input(s): CKTOTAL, CKMB, CKMBINDEX, TROPONINI in the last 168 hours. BNP: Invalid input(s): POCBNP CBG:  Recent Labs Lab 11/27/16 1941 11/28/16 0011 11/28/16 0355 11/28/16 0807 11/28/16 1128  GLUCAP 104* 129* 111* 127* 137*   D-Dimer No results for input(s): DDIMER in the last 72 hours. Hgb A1c No results for input(s): HGBA1C in the last 72 hours. Lipid Profile No results for input(s): CHOL, HDL, LDLCALC, TRIG, CHOLHDL, LDLDIRECT in the last 72 hours. Thyroid function studies No results for input(s): TSH, T4TOTAL, T3FREE, THYROIDAB in the last 72 hours.  Invalid input(s): FREET3 Anemia work up No results for input(s): VITAMINB12, FOLATE, FERRITIN, TIBC, IRON, RETICCTPCT in the last 72 hours. Urinalysis    Component Value Date/Time   COLORURINE YELLOW 11/06/2016 1532   APPEARANCEUR HAZY (A) 11/06/2016 1532   LABSPEC 1.011 11/06/2016 1532   PHURINE 5.0 11/06/2016 1532   GLUCOSEU NEGATIVE 11/06/2016 1532   HGBUR MODERATE (A) 11/06/2016 1532   BILIRUBINUR NEGATIVE 11/06/2016 1532   KETONESUR NEGATIVE 11/06/2016 1532   PROTEINUR 30 (A) 11/06/2016 1532   NITRITE NEGATIVE 11/06/2016 1532   LEUKOCYTESUR LARGE (A) 11/06/2016 1532   Sepsis Labs Invalid input(s): PROCALCITONIN,  WBC,  LACTICIDVEN Microbiology Recent Results (from the past 240 hour(s))  Culture, blood (routine x 2)     Status: None   Collection Time: 11/20/16 12:25 PM  Result Value Ref Range Status   Specimen Description BLOOD BLOOD RIGHT HAND  Final   Special Requests   Final    BOTTLES DRAWN AEROBIC ONLY Blood Culture adequate volume   Culture NO GROWTH 5 DAYS  Final   Report Status 11/25/2016 FINAL  Final  Culture, blood (routine x 2)     Status: None   Collection Time: 11/20/16 12:30 PM  Result Value Ref Range Status   Specimen Description BLOOD BLOOD RIGHT ARM  Final   Special Requests   Final    BOTTLES DRAWN AEROBIC ONLY  Blood Culture adequate volume   Culture NO GROWTH 5 DAYS  Final   Report Status 11/25/2016 FINAL  Final  Culture, Urine     Status: Abnormal   Collection Time: 11/24/16  2:43 PM  Result Value Ref Range Status   Specimen Description URINE, CATHETERIZED  Final   Special Requests NONE  Final   Culture <10,000 COLONIES/mL INSIGNIFICANT GROWTH (A)  Final   Report Status 11/25/2016 FINAL  Final     Time coordinating discharge: Over 30 minutes  SIGNED:   Cordelia Poche, MD [facility] 11/28/2016, 12:59 PM Pager [phone]  If 7PM-7AM, please contact night-coverage [email or web address] Password [email or web address]

## 2016-11-29 LAB — GLUCOSE, CAPILLARY
Glucose-Capillary: 109 mg/dL — ABNORMAL HIGH (ref 65–99)
Glucose-Capillary: 126 mg/dL — ABNORMAL HIGH (ref 65–99)
Glucose-Capillary: 166 mg/dL — ABNORMAL HIGH (ref 65–99)

## 2016-11-29 NOTE — Progress Notes (Signed)
Patient seen for trach team follow up.  No education given at this time, no equipment needed at this time.  Will continue to follow for progression.

## 2016-11-29 NOTE — Progress Notes (Signed)
Pt being discharged to [facility] per orders from MD. Pt and family aware of transfer. Pt's IV was removed prior to transport. Pt transported via stretcher. RN will call to give report to [facility].

## 2016-11-29 NOTE — Care Management Note (Signed)
Case Management Note  Patient Details  Name: Scott Rivera MRN: [id number] Date of Birth: 1950-05-06  Subjective/Objective:                    Action/Plan: Pt discharging to [facility]. No further needs per CM.   Expected Discharge Date:  11/28/16               Expected Discharge Plan:  Skilled Nursing Facility  In-House Referral:  Clinical Social Work  Discharge planning Services  CM Consult  Post Acute Care Choice:  IP Rehab Choice offered to:     DME Arranged:    DME Agency:     HH Arranged:    [place] Agency:     Status of Service:  Completed, signed off  If discussed at [employer], dates discussed:    Additional Comments:  Pollie Friar, RN 11/29/2016, 12:37 PM

## 2016-11-29 NOTE — Progress Notes (Signed)
Discharge to: [facility] Anticipated discharge date: 11/29/16 Family notified: Yes, at beside Transportation by: [facility]  Report #: [id number], Room 115  CSW signing off.  Laveda Abbe LCSW [phone]

## 2016-11-29 NOTE — Progress Notes (Addendum)
Patient was D/C'd yesterday by Dr. Lonny Prude but patient's family refused to take him to Facility because he was going to have a semi-private room last night. Please refer to Dr. Lisbeth Ply D/C Summary as patient will be transported today. Discussed with the Education officer, museum and is arranging transport to be transferred this AM. Patient seen and examined and no acute changes overnight and no new changes from yesterday.

## 2016-12-09 ENCOUNTER — Emergency Department (HOSPITAL_COMMUNITY): Payer: Medicare HMO

## 2016-12-09 ENCOUNTER — Inpatient Hospital Stay (HOSPITAL_COMMUNITY)
Admission: EM | Admit: 2016-12-09 | Discharge: 2016-12-21 | DRG: 871 | Disposition: A | Discharge status: Skilled Nursing Facility | Payer: Medicare HMO | Attending: Internal Medicine | Admitting: Internal Medicine

## 2016-12-09 ENCOUNTER — Encounter (HOSPITAL_COMMUNITY): Payer: Self-pay

## 2016-12-09 DIAGNOSIS — E870 Hyperosmolality and hypernatremia: Secondary | ICD-10-CM | POA: Diagnosis present

## 2016-12-09 DIAGNOSIS — E119 Type 2 diabetes mellitus without complications: Secondary | ICD-10-CM | POA: Diagnosis present

## 2016-12-09 DIAGNOSIS — R1310 Dysphagia, unspecified: Secondary | ICD-10-CM | POA: Diagnosis present

## 2016-12-09 DIAGNOSIS — S270XXS Traumatic pneumothorax, sequela: Secondary | ICD-10-CM | POA: Diagnosis not present

## 2016-12-09 DIAGNOSIS — R0682 Tachypnea, not elsewhere classified: Secondary | ICD-10-CM | POA: Diagnosis not present

## 2016-12-09 DIAGNOSIS — J939 Pneumothorax, unspecified: Secondary | ICD-10-CM

## 2016-12-09 DIAGNOSIS — Z79899 Other long term (current) drug therapy: Secondary | ICD-10-CM

## 2016-12-09 DIAGNOSIS — R7989 Other specified abnormal findings of blood chemistry: Secondary | ICD-10-CM | POA: Diagnosis present

## 2016-12-09 DIAGNOSIS — J9620 Acute and chronic respiratory failure, unspecified whether with hypoxia or hypercapnia: Secondary | ICD-10-CM | POA: Diagnosis not present

## 2016-12-09 DIAGNOSIS — N183 Chronic kidney disease, stage 3 (moderate): Secondary | ICD-10-CM | POA: Diagnosis present

## 2016-12-09 DIAGNOSIS — J181 Lobar pneumonia, unspecified organism: Secondary | ICD-10-CM | POA: Diagnosis not present

## 2016-12-09 DIAGNOSIS — Z7189 Other specified counseling: Secondary | ICD-10-CM | POA: Diagnosis not present

## 2016-12-09 DIAGNOSIS — R739 Hyperglycemia, unspecified: Secondary | ICD-10-CM | POA: Diagnosis present

## 2016-12-09 DIAGNOSIS — J9621 Acute and chronic respiratory failure with hypoxia: Secondary | ICD-10-CM | POA: Diagnosis present

## 2016-12-09 DIAGNOSIS — J690 Pneumonitis due to inhalation of food and vomit: Secondary | ICD-10-CM | POA: Diagnosis present

## 2016-12-09 DIAGNOSIS — J90 Pleural effusion, not elsewhere classified: Secondary | ICD-10-CM | POA: Diagnosis not present

## 2016-12-09 DIAGNOSIS — N189 Chronic kidney disease, unspecified: Secondary | ICD-10-CM | POA: Diagnosis not present

## 2016-12-09 DIAGNOSIS — D631 Anemia in chronic kidney disease: Secondary | ICD-10-CM | POA: Diagnosis present

## 2016-12-09 DIAGNOSIS — I69391 Dysphagia following cerebral infarction: Secondary | ICD-10-CM

## 2016-12-09 DIAGNOSIS — Z515 Encounter for palliative care: Secondary | ICD-10-CM | POA: Diagnosis present

## 2016-12-09 DIAGNOSIS — E874 Mixed disorder of acid-base balance: Secondary | ICD-10-CM | POA: Diagnosis present

## 2016-12-09 DIAGNOSIS — R509 Fever, unspecified: Secondary | ICD-10-CM | POA: Diagnosis not present

## 2016-12-09 DIAGNOSIS — D72829 Elevated white blood cell count, unspecified: Secondary | ICD-10-CM | POA: Diagnosis not present

## 2016-12-09 DIAGNOSIS — R0603 Acute respiratory distress: Secondary | ICD-10-CM

## 2016-12-09 DIAGNOSIS — A419 Sepsis, unspecified organism: Principal | ICD-10-CM | POA: Diagnosis present

## 2016-12-09 DIAGNOSIS — J9601 Acute respiratory failure with hypoxia: Secondary | ICD-10-CM | POA: Diagnosis not present

## 2016-12-09 DIAGNOSIS — R748 Abnormal levels of other serum enzymes: Secondary | ICD-10-CM | POA: Diagnosis not present

## 2016-12-09 DIAGNOSIS — Z7982 Long term (current) use of aspirin: Secondary | ICD-10-CM | POA: Diagnosis not present

## 2016-12-09 DIAGNOSIS — J9600 Acute respiratory failure, unspecified whether with hypoxia or hypercapnia: Secondary | ICD-10-CM | POA: Diagnosis not present

## 2016-12-09 DIAGNOSIS — Z720 Tobacco use: Secondary | ICD-10-CM | POA: Diagnosis present

## 2016-12-09 DIAGNOSIS — Z09 Encounter for follow-up examination after completed treatment for conditions other than malignant neoplasm: Secondary | ICD-10-CM | POA: Diagnosis not present

## 2016-12-09 DIAGNOSIS — I248 Other forms of acute ischemic heart disease: Secondary | ICD-10-CM | POA: Diagnosis present

## 2016-12-09 DIAGNOSIS — S270XXA Traumatic pneumothorax, initial encounter: Secondary | ICD-10-CM | POA: Diagnosis present

## 2016-12-09 DIAGNOSIS — Z930 Tracheostomy status: Secondary | ICD-10-CM | POA: Diagnosis not present

## 2016-12-09 DIAGNOSIS — Z7902 Long term (current) use of antithrombotics/antiplatelets: Secondary | ICD-10-CM

## 2016-12-09 DIAGNOSIS — E860 Dehydration: Secondary | ICD-10-CM | POA: Diagnosis present

## 2016-12-09 DIAGNOSIS — I509 Heart failure, unspecified: Secondary | ICD-10-CM | POA: Diagnosis present

## 2016-12-09 DIAGNOSIS — Z6820 Body mass index (BMI) 20.0-20.9, adult: Secondary | ICD-10-CM

## 2016-12-09 DIAGNOSIS — L89153 Pressure ulcer of sacral region, stage 3: Secondary | ICD-10-CM | POA: Diagnosis present

## 2016-12-09 DIAGNOSIS — R6520 Severe sepsis without septic shock: Secondary | ICD-10-CM | POA: Diagnosis present

## 2016-12-09 DIAGNOSIS — N179 Acute kidney failure, unspecified: Secondary | ICD-10-CM | POA: Diagnosis present

## 2016-12-09 DIAGNOSIS — Z931 Gastrostomy status: Secondary | ICD-10-CM

## 2016-12-09 DIAGNOSIS — E873 Alkalosis: Secondary | ICD-10-CM | POA: Diagnosis not present

## 2016-12-09 DIAGNOSIS — D638 Anemia in other chronic diseases classified elsewhere: Secondary | ICD-10-CM | POA: Diagnosis present

## 2016-12-09 DIAGNOSIS — E43 Unspecified severe protein-calorie malnutrition: Secondary | ICD-10-CM | POA: Diagnosis present

## 2016-12-09 DIAGNOSIS — J189 Pneumonia, unspecified organism: Secondary | ICD-10-CM | POA: Diagnosis not present

## 2016-12-09 DIAGNOSIS — R0902 Hypoxemia: Secondary | ICD-10-CM

## 2016-12-09 DIAGNOSIS — R778 Other specified abnormalities of plasma proteins: Secondary | ICD-10-CM | POA: Diagnosis present

## 2016-12-09 DIAGNOSIS — I10 Essential (primary) hypertension: Secondary | ICD-10-CM | POA: Diagnosis not present

## 2016-12-09 DIAGNOSIS — Z9889 Other specified postprocedural states: Secondary | ICD-10-CM

## 2016-12-09 DIAGNOSIS — I130 Hypertensive heart and chronic kidney disease with heart failure and stage 1 through stage 4 chronic kidney disease, or unspecified chronic kidney disease: Secondary | ICD-10-CM | POA: Diagnosis present

## 2016-12-09 DIAGNOSIS — I959 Hypotension, unspecified: Secondary | ICD-10-CM | POA: Diagnosis present

## 2016-12-09 HISTORY — DX: Chronic kidney disease, stage 3 unspecified: N18.30

## 2016-12-09 HISTORY — DX: Anemia in chronic kidney disease: D63.1

## 2016-12-09 HISTORY — DX: Pneumonitis due to inhalation of food and vomit: J69.0

## 2016-12-09 HISTORY — DX: Anemia in chronic kidney disease: N18.9

## 2016-12-09 HISTORY — DX: Chronic kidney disease, stage 3 (moderate): N18.3

## 2016-12-09 LAB — CBC WITH DIFFERENTIAL/PLATELET
Basophils Absolute: 0 10*3/uL (ref 0.0–0.1)
Basophils Relative: 0 %
Eosinophils Absolute: 0.3 10*3/uL (ref 0.0–0.7)
Eosinophils Relative: 1 %
HCT: 30.7 % — ABNORMAL LOW (ref 39.0–52.0)
Hemoglobin: 9.2 g/dL — ABNORMAL LOW (ref 13.0–17.0)
Lymphocytes Relative: 9 %
Lymphs Abs: 2.5 10*3/uL (ref 0.7–4.0)
MCH: 28.3 pg (ref 26.0–34.0)
MCHC: 30 g/dL (ref 30.0–36.0)
MCV: 94.5 fL (ref 78.0–100.0)
Monocytes Absolute: 2.5 10*3/uL — ABNORMAL HIGH (ref 0.1–1.0)
Monocytes Relative: 9 %
Neutro Abs: 22.3 10*3/uL — ABNORMAL HIGH (ref 1.7–7.7)
Neutrophils Relative %: 81 %
Platelets: 836 10*3/uL — ABNORMAL HIGH (ref 150–400)
RBC: 3.25 MIL/uL — ABNORMAL LOW (ref 4.22–5.81)
RDW: 18.6 % — ABNORMAL HIGH (ref 11.5–15.5)
WBC: 27.6 10*3/uL — ABNORMAL HIGH (ref 4.0–10.5)

## 2016-12-09 LAB — BRAIN NATRIURETIC PEPTIDE: B Natriuretic Peptide: 18 pg/mL (ref 0.0–100.0)

## 2016-12-09 LAB — COMPREHENSIVE METABOLIC PANEL
ALT: 75 U/L — ABNORMAL HIGH (ref 17–63)
AST: 62 U/L — ABNORMAL HIGH (ref 15–41)
Albumin: 2.2 g/dL — ABNORMAL LOW (ref 3.5–5.0)
Alkaline Phosphatase: 230 U/L — ABNORMAL HIGH (ref 38–126)
Anion gap: 13 (ref 5–15)
BUN: 66 mg/dL — ABNORMAL HIGH (ref 6–20)
CO2: 26 mmol/L (ref 22–32)
Calcium: 11.4 mg/dL — ABNORMAL HIGH (ref 8.9–10.3)
Chloride: 115 mmol/L — ABNORMAL HIGH (ref 101–111)
Creatinine, Ser: 2.47 mg/dL — ABNORMAL HIGH (ref 0.61–1.24)
GFR calc Af Amer: 30 mL/min — ABNORMAL LOW (ref >60)
GFR calc non Af Amer: 26 mL/min — ABNORMAL LOW (ref >60)
Glucose, Bld: 152 mg/dL — ABNORMAL HIGH (ref 65–99)
Potassium: 6.2 mmol/L — ABNORMAL HIGH (ref 3.5–5.1)
Sodium: 154 mmol/L — ABNORMAL HIGH (ref 135–145)
Total Bilirubin: 1.1 mg/dL (ref 0.3–1.2)
Total Protein: 9.5 g/dL — ABNORMAL HIGH (ref 6.5–8.1)

## 2016-12-09 LAB — BASIC METABOLIC PANEL
Anion gap: 10 (ref 5–15)
BUN: 48 mg/dL — ABNORMAL HIGH (ref 6–20)
CO2: 23 mmol/L (ref 22–32)
Calcium: 10.5 mg/dL — ABNORMAL HIGH (ref 8.9–10.3)
Chloride: 124 mmol/L — ABNORMAL HIGH (ref 101–111)
Creatinine, Ser: 1.88 mg/dL — ABNORMAL HIGH (ref 0.61–1.24)
GFR calc Af Amer: 41 mL/min — ABNORMAL LOW (ref >60)
GFR calc non Af Amer: 36 mL/min — ABNORMAL LOW (ref >60)
Glucose, Bld: 88 mg/dL (ref 65–99)
Potassium: 4.4 mmol/L (ref 3.5–5.1)
Sodium: 157 mmol/L — ABNORMAL HIGH (ref 135–145)

## 2016-12-09 LAB — I-STAT ARTERIAL BLOOD GAS, ED
Acid-Base Excess: 3 mmol/L — ABNORMAL HIGH (ref 0.0–2.0)
Bicarbonate: 26.1 mmol/L (ref 20.0–28.0)
O2 Saturation: 95 %
Patient temperature: 101.1
TCO2: 27 mmol/L (ref 22–32)
pCO2 arterial: 31.7 mmHg — ABNORMAL LOW (ref 32.0–48.0)
pH, Arterial: 7.528 — ABNORMAL HIGH (ref 7.350–7.450)
pO2, Arterial: 73 mmHg — ABNORMAL LOW (ref 83.0–108.0)

## 2016-12-09 LAB — BLOOD GAS, ARTERIAL
Acid-base deficit: 0.1 mmol/L (ref 0.0–2.0)
Bicarbonate: 23.9 mmol/L (ref 20.0–28.0)
Drawn by: 105521
FIO2: 0.35
O2 Saturation: 91.9 %
Patient temperature: 98.6
pCO2 arterial: 38.4 mmHg (ref 32.0–48.0)
pH, Arterial: 7.411 (ref 7.350–7.450)
pO2, Arterial: 64.2 mmHg — ABNORMAL LOW (ref 83.0–108.0)

## 2016-12-09 LAB — URINALYSIS, ROUTINE W REFLEX MICROSCOPIC
Bacteria, UA: NONE SEEN
Bilirubin Urine: NEGATIVE
Glucose, UA: NEGATIVE mg/dL
Hgb urine dipstick: NEGATIVE
Ketones, ur: NEGATIVE mg/dL
Leukocytes, UA: NEGATIVE
Nitrite: NEGATIVE
Protein, ur: 30 mg/dL — AB
Specific Gravity, Urine: 1.016 (ref 1.005–1.030)
Squamous Epithelial / LPF: NONE SEEN
pH: 6 (ref 5.0–8.0)

## 2016-12-09 LAB — I-STAT CHEM 8, ED
BUN: 55 mg/dL — ABNORMAL HIGH (ref 6–20)
Calcium, Ion: 1.39 mmol/L (ref 1.15–1.40)
Chloride: 125 mmol/L — ABNORMAL HIGH (ref 101–111)
Creatinine, Ser: 2.1 mg/dL — ABNORMAL HIGH (ref 0.61–1.24)
Glucose, Bld: 119 mg/dL — ABNORMAL HIGH (ref 65–99)
HCT: 23 % — ABNORMAL LOW (ref 39.0–52.0)
Hemoglobin: 7.8 g/dL — ABNORMAL LOW (ref 13.0–17.0)
Potassium: 4.4 mmol/L (ref 3.5–5.1)
Sodium: 158 mmol/L — ABNORMAL HIGH (ref 135–145)
TCO2: 25 mmol/L (ref 22–32)

## 2016-12-09 LAB — GLUCOSE, CAPILLARY: Glucose-Capillary: 93 mg/dL (ref 65–99)

## 2016-12-09 LAB — PROCALCITONIN: Procalcitonin: 1.23 ng/mL

## 2016-12-09 LAB — HEMOGLOBIN A1C
Hgb A1c MFr Bld: 5.7 % — ABNORMAL HIGH (ref 4.8–5.6)
Mean Plasma Glucose: 116.89 mg/dL

## 2016-12-09 LAB — I-STAT CG4 LACTIC ACID, ED
Lactic Acid, Venous: 2.94 mmol/L (ref 0.5–1.9)
Lactic Acid, Venous: 1.88 mmol/L (ref 0.5–1.9)

## 2016-12-09 LAB — TROPONIN I
Troponin I: 0.03 ng/mL (ref <0.03)
Troponin I: 0.17 ng/mL (ref <0.03)

## 2016-12-09 LAB — PREPARE RBC (CROSSMATCH)

## 2016-12-09 LAB — CBG MONITORING, ED: Glucose-Capillary: 144 mg/dL — ABNORMAL HIGH (ref 65–99)

## 2016-12-09 LAB — MRSA PCR SCREENING: MRSA by PCR: NEGATIVE

## 2016-12-09 MED ORDER — ACETAMINOPHEN 650 MG RE SUPP: 650.0000 mg | Freq: Four times a day (QID) | RECTAL | Status: DC | PRN

## 2016-12-09 MED ORDER — SODIUM CHLORIDE 0.9 % IV SOLN
1250.0000 mg | Freq: Once | INTRAVENOUS | Status: AC
  Administered 2016-12-09: 1250 mg via INTRAVENOUS
  Filled 2016-12-09: qty 1250

## 2016-12-09 MED ORDER — ADULT MULTIVITAMIN W/MINERALS CH: 1.0000 | ORAL_TABLET | Freq: Every day | ORAL | Status: DC

## 2016-12-09 MED ORDER — ENOXAPARIN SODIUM 30 MG/0.3ML ~~LOC~~ SOLN
30.0000 mg | SUBCUTANEOUS | Status: DC
Start: 2016-12-09 — End: 2016-12-10
  Administered 2016-12-09: 30 mg via SUBCUTANEOUS
  Filled 2016-12-09: qty 0.3

## 2016-12-09 MED ORDER — PRAVASTATIN SODIUM 20 MG PO TABS
20.0000 mg | ORAL_TABLET | Freq: Every day | ORAL | Status: DC
  Administered 2016-12-09 – 2016-12-20 (×12): 20 mg
  Filled 2016-12-09 (×12): qty 1

## 2016-12-09 MED ORDER — DEXTROSE 5 % IV SOLN
2.0000 g | INTRAVENOUS | Status: DC
  Administered 2016-12-10: 2 g via INTRAVENOUS
  Filled 2016-12-09: qty 2

## 2016-12-09 MED ORDER — ACETAMINOPHEN 650 MG RE SUPP
650.0000 mg | Freq: Once | RECTAL | Status: AC
  Administered 2016-12-09: 650 mg via RECTAL
  Filled 2016-12-09: qty 1

## 2016-12-09 MED ORDER — MORPHINE SULFATE (PF) 4 MG/ML IV SOLN: 2.0000 mg | INTRAVENOUS | Status: DC | PRN

## 2016-12-09 MED ORDER — SODIUM CHLORIDE 0.9% FLUSH
3.0000 mL | Freq: Two times a day (BID) | INTRAVENOUS | Status: DC
  Administered 2016-12-10 – 2016-12-21 (×17): 3 mL via INTRAVENOUS

## 2016-12-09 MED ORDER — VANCOMYCIN HCL IN DEXTROSE 1-5 GM/200ML-% IV SOLN
1000.0000 mg | INTRAVENOUS | Status: DC
  Administered 2016-12-10 – 2016-12-11 (×2): 1000 mg via INTRAVENOUS
  Filled 2016-12-09 (×2): qty 200

## 2016-12-09 MED ORDER — FAMOTIDINE 40 MG/5ML PO SUSR
20.0000 mg | Freq: Two times a day (BID) | ORAL | Status: DC
  Administered 2016-12-10 – 2016-12-21 (×24): 20 mg
  Filled 2016-12-09 (×24): qty 2.5

## 2016-12-09 MED ORDER — METOCLOPRAMIDE HCL 5 MG/5ML PO SOLN
10.0000 mg | Freq: Three times a day (TID) | ORAL | Status: DC
  Administered 2016-12-09 – 2016-12-21 (×49): 10 mg
  Filled 2016-12-09 (×49): qty 10

## 2016-12-09 MED ORDER — SENNA 8.6 MG PO TABS
1.0000 | ORAL_TABLET | Freq: Two times a day (BID) | ORAL | Status: DC
  Administered 2016-12-10 – 2016-12-21 (×24): 8.6 mg
  Filled 2016-12-09 (×24): qty 1

## 2016-12-09 MED ORDER — ASPIRIN 325 MG PO TABS
325.0000 mg | ORAL_TABLET | Freq: Every day | ORAL | Status: DC
  Administered 2016-12-09 – 2016-12-21 (×12): 325 mg
  Filled 2016-12-09 (×18): qty 1

## 2016-12-09 MED ORDER — SODIUM CHLORIDE 0.9 % IV SOLN
1000.0000 mL | INTRAVENOUS | Status: DC
  Administered 2016-12-09: 1000 mL via INTRAVENOUS

## 2016-12-09 MED ORDER — ORAL CARE MOUTH RINSE
15.0000 mL | Freq: Four times a day (QID) | OROMUCOSAL | Status: DC
  Administered 2016-12-10 – 2016-12-21 (×44): 15 mL via OROMUCOSAL

## 2016-12-09 MED ORDER — SODIUM CHLORIDE 0.9 % IV SOLN: Freq: Once | INTRAVENOUS | Status: DC

## 2016-12-09 MED ORDER — CLOPIDOGREL BISULFATE 75 MG PO TABS
75.0000 mg | ORAL_TABLET | Freq: Every day | ORAL | Status: DC
  Administered 2016-12-09 – 2016-12-21 (×13): 75 mg
  Filled 2016-12-09 (×13): qty 1

## 2016-12-09 MED ORDER — SODIUM CHLORIDE 0.9 % IV BOLUS (SEPSIS)
1000.0000 mL | Freq: Once | INTRAVENOUS | Status: AC
  Administered 2016-12-09: 1000 mL via INTRAVENOUS

## 2016-12-09 MED ORDER — SODIUM CHLORIDE 0.45 % IV SOLN
INTRAVENOUS | Status: DC
  Administered 2016-12-09: 1000 mL via INTRAVENOUS

## 2016-12-09 MED ORDER — VANCOMYCIN HCL IN DEXTROSE 1-5 GM/200ML-% IV SOLN: 1000.0000 mg | Freq: Once | INTRAVENOUS | Status: DC

## 2016-12-09 MED ORDER — M.V.I. ADULT IV INJ
INJECTION | INTRAVENOUS | Status: DC
  Administered 2016-12-10: via INTRAVENOUS
  Filled 2016-12-09 (×2): qty 1000

## 2016-12-09 MED ORDER — TRAMADOL HCL 50 MG PO TABS: 50.0000 mg | ORAL_TABLET | Freq: Two times a day (BID) | ORAL | Status: DC | PRN

## 2016-12-09 MED ORDER — ACETAMINOPHEN 325 MG PO TABS
650.0000 mg | ORAL_TABLET | Freq: Four times a day (QID) | ORAL | Status: DC | PRN
  Administered 2016-12-16 – 2016-12-17 (×2): 650 mg via ORAL
  Filled 2016-12-09 (×2): qty 2

## 2016-12-09 MED ORDER — CEFEPIME HCL 2 G IJ SOLR
2.0000 g | Freq: Once | INTRAMUSCULAR | Status: AC
  Administered 2016-12-09: 2 g via INTRAVENOUS
  Filled 2016-12-09: qty 2

## 2016-12-09 MED ORDER — FUROSEMIDE 10 MG/ML IJ SOLN
20.0000 mg | Freq: Once | INTRAMUSCULAR | Status: AC
  Administered 2016-12-09: 20 mg via INTRAVENOUS
  Filled 2016-12-09: qty 2

## 2016-12-09 MED ORDER — DEXTROSE-NACL 5-0.45 % IV SOLN
INTRAVENOUS | Status: DC
Start: 2016-12-09 — End: 2016-12-11
  Administered 2016-12-10 – 2016-12-11 (×4): via INTRAVENOUS

## 2016-12-09 MED ORDER — CHLORHEXIDINE GLUCONATE 0.12% ORAL RINSE (MEDLINE KIT)
15.0000 mL | Freq: Two times a day (BID) | OROMUCOSAL | Status: DC
  Administered 2016-12-10 – 2016-12-21 (×22): 15 mL via OROMUCOSAL

## 2016-12-09 MED ORDER — MORPHINE SULFATE (PF) 4 MG/ML IV SOLN: 4.0000 mg | INTRAVENOUS | Status: DC | PRN

## 2016-12-09 MED ORDER — SODIUM CHLORIDE 0.9 % IV BOLUS (SEPSIS)
250.0000 mL | Freq: Once | INTRAVENOUS | Status: AC
  Administered 2016-12-09: 250 mL via INTRAVENOUS

## 2016-12-09 MED ORDER — VITAMIN B-1 100 MG PO TABS: 100.0000 mg | ORAL_TABLET | Freq: Every day | ORAL | Status: DC

## 2016-12-09 NOTE — Progress Notes (Signed)
Pts Na still elevated at 157, Ca elevated at 10.5.  Troponin and type and screen collected but still in process.  Updated attending MD

## 2016-12-09 NOTE — ED Notes (Signed)
Need to repeat EKG

## 2016-12-09 NOTE — ED Notes (Addendum)
Patients gown changed, sheets changed, and depends changed. Patient tolerated well. Patient readjusted in bed for comfort. Trach collar in place.

## 2016-12-09 NOTE — Progress Notes (Signed)
Attempted calling phlebotomy x2, unable to reach tech. Relayed that pt has multiple lab orders, including type and screen, that need to be drawn and overdue. Was told phlebotomy tech will be contacted.

## 2016-12-09 NOTE — ED Notes (Addendum)
Report called to [facility] on [place]. RT at bedside to help transport.

## 2016-12-09 NOTE — Consult Note (Signed)
PULMONARY / CRITICAL CARE MEDICINE   Name: Scott Rivera MRN: [id number] DOB: 1951/03/07    ADMISSION DATE:  12/09/2016 CONSULTATION DATE:  12/09/2016  REFERRING MD:  Dr. Evangeline Gula  CHIEF COMPLAINT:  Evaluation of probable aspiration pneumonia and impending sepsis  HISTORY OF PRESENT ILLNESS:   Mr. Kuhrt is a 66 year old male with history of hypertension, CVA, recurrent aspiration requiring tracheostomy and G-tube, who lives in a nursing home and presented to the emergency department after EMS was called out for the patient being found unresponsive. He was previously admitted 10/19/2016 with bi-hemispheric and cerebellar infarcts and sepsis secondary to aspiration pneumonia, with a requirement for mechanical ventilation. Due to recurrent aspirations he had a tracheostomy placed as well as a PEG for feedings. 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 He was discharged to a SNF. Reportedly he was found with a GCS of 3 by nursing home staff and completely unresponsive. During transport , EMS reported O2 sats in the 80s. Upon arrival he was tachycardic and febrile to 101.1 F but not hypotensive. His BP has was transiently labile, falling to as low as 94/53; he has received 5500 cc thus far. With a leukocytosis of 27.6K and PCXR showing a RLL infiltrate, he has been empirically started on Vanc + Cefipime. Although he moves all 4 extremities with open eyes, he does not respond to verbal commands or stimuli.  PAST MEDICAL HISTORY :  He  has a past medical history of Hypertension and Stroke ([place]).  PAST SURGICAL HISTORY: He  has a past surgical history that includes Knee surgery and IR GASTROSTOMY TUBE MOD SED (10/27/2016).  No Known Allergies  No current facility-administered medications on file prior to encounter.    Current Outpatient Prescriptions on File Prior to Encounter  Medication Sig  . Amino Acids-Protein Hydrolys (FEEDING SUPPLEMENT, PRO-STAT SUGAR  FREE 64,) LIQD Place 30 mLs into feeding tube 3 (three) times daily.  Marland Kitchen amLODipine (NORVASC) 10 MG tablet Place 1 tablet (10 mg total) into feeding tube daily.  Marland Kitchen aspirin 325 MG tablet Place 1 tablet (325 mg total) into feeding tube daily.  . bethanechol (URECHOLINE) 10 MG tablet Place 1 tablet (10 mg total) into feeding tube 3 (three) times daily.  . cloNIDine (CATAPRES) 0.1 MG tablet Place 1 tablet (0.1 mg total) into feeding tube 2 (two) times daily.  . clopidogrel (PLAVIX) 75 MG tablet Place 1 tablet (75 mg total) into feeding tube daily.  . famotidine (PEPCID) 40 MG/5ML suspension Place 2.5 mLs (20 mg total) into feeding tube 2 (two) times daily.  . metoCLOPramide (REGLAN) 5 MG/5ML solution Place 10 mLs (10 mg total) into feeding tube 4 (four) times daily -  before meals and at bedtime.  . metoprolol tartrate (LOPRESSOR) 25 mg/10 mL SUSP Place 10 mLs (25 mg total) into feeding tube 2 (two) times daily.  . Multiple Vitamin (MULTIVITAMIN WITH MINERALS) TABS tablet Place 1 tablet into feeding tube daily.  . nicotine (NICODERM CQ - DOSED IN MG/24 HOURS) 14 mg/24hr patch Place 1 patch (14 mg total) onto the skin daily.  . Nutritional Supplements (FEEDING SUPPLEMENT, JEVITY 1.2 CAL,) LIQD Place 1,000 mLs into feeding tube continuous.  . Nutritional Supplements (FEEDING SUPPLEMENT, JEVITY 1.5 CAL/FIBER,) LIQD Place 1,000 mLs into feeding tube daily. 71ml/hr  . pravastatin (PRAVACHOL) 20 MG tablet Place 1 tablet (20 mg total) into feeding tube daily at 6 PM.  . thiamine 100 MG tablet Place 1 tablet (100 mg total)  into feeding tube daily.  . traMADol (ULTRAM) 50 MG tablet Take 1 tablet (50 mg total) by mouth 2 (two) times daily as needed for severe pain.  . Water For Irrigation, Sterile (FREE WATER) SOLN Place 300 mLs into feeding tube every 4 (four) hours.    FAMILY HISTORY:  His indicated that the status of his brother is unknown. He indicated that the status of his neg hx is unknown.    SOCIAL  HISTORY: He  reports that he has been smoking.  He has never used smokeless tobacco. He reports that he drinks alcohol.  REVIEW OF SYSTEMS:   Unable to prove a hx; per previous H&P  SUBJECTIVE:  Unable to provide a hx  VITAL SIGNS: BP (!) 132/57   Pulse (!) 126   Temp 99.2 F (37.3 C) (Temporal)   Resp (!) 28   Ht 5\' 11"  (1.803 m)   Wt 71.2 kg (157 lb)   SpO2 99%   BMI 21.90 kg/m   HEMODYNAMICS:    VENTILATOR SETTINGS: FiO2 (%):  [28 %-40 %] 40 %  INTAKE / OUTPUT: I/O last 3 completed shifts: In: 66 [IV Piggyback:1550] Out: -   PHYSICAL EXAMINATION: General:  Thin black male in no acute respiratory distress Neuro: Moves all 4s HEENT:  [place]/AT; PRRL, arcus senilus; poor dentition; neck supple with trach in situ Cardiovascular:  Tachycardic but regular; No murmurs Lungs:  Bibasilar crackles with diminished BS Abdomen:  PEG in situ; no organomegaly, guarding Musculoskeletal:  Surgical scars both knees Skin:  No edema  LABS:  BMET  Recent Labs Lab 12/09/16 0350 12/09/16 0614  NA 154* 158*  K 6.2* 4.4  CL 115* 125*  CO2 26  --   BUN 66* 55*  CREATININE 2.47* 2.10*  GLUCOSE 152* 119*    Electrolytes  Recent Labs Lab 12/09/16 0350  CALCIUM 11.4*    CBC  Recent Labs Lab 12/09/16 0350 12/09/16 0614  WBC 27.6*  --   HGB 9.2* 7.8*  HCT 30.7* 23.0*  PLT 836*  --     Coag's No results for input(s): APTT, INR in the last 168 hours.  Sepsis Markers  Recent Labs Lab 12/09/16 0405 12/09/16 0638  LATICACIDVEN 2.94* 1.88    ABG  Recent Labs Lab 12/09/16 0430  PHART 7.528*  PCO2ART 31.7*  PO2ART 73.0*    Liver Enzymes  Recent Labs Lab 12/09/16 0350  AST 62*  ALT 75*  ALKPHOS 230*  BILITOT 1.1  ALBUMIN 2.2*    Cardiac Enzymes  Recent Labs Lab 12/09/16 0350  TROPONINI 0.03*    Glucose  Recent Labs Lab 12/09/16 0426  GLUCAP 144*    Imaging Dg Chest Port 1 View  Result Date: 12/09/2016 CLINICAL DATA:   Pneumonia. EXAM: PORTABLE CHEST 1 VIEW COMPARISON:  Radiograph 11/20/2016 FINDINGS: Tracheostomy tube at the thoracic inlet. Worsening patchy right lung base opacity. Unchanged left basilar opacity. Upper lungs are clear, the patient's chin obscures the right lung apex. Unchanged heart size and mediastinal contours. No large pleural effusion. No pneumothorax. Tubular catheter like density projecting over the left upper chest is presumably external to the patient. IMPRESSION: Increasing right lung base opacity. This may be pneumonia, aspiration or atelectasis. Left basilar opacity is unchanged. Electronically Signed   By: Jeb Levering M.D.   On: 12/09/2016 04:24     STUDIES:  ECG: No acute ST-T changes PCXR: RLL infiltrate  CULTURES: Sent  ANTIBIOTICS: Vanc + Cefipime   DISCUSSION: 67 y.o. B male s/p  CVA and aspiration pneumonia. Now with likely recurrent aspiration.  ASSESSMENT / PLAN:  PULMONARY A: ABGs with adequate oxygenation and no CO2 retention.  P:   Titrate FIO2 to maintain sats >90.  CARDIOVASCULAR A:  Hemodynamically stable after fluid resuscitation. No evidence of any acute ischemic events. P:  Monitor  RENAL A:   Pre-renal azotemia likely secondary to dehydration with hypernatremia P:   Would now switch to 1/2 NS  GASTROINTESTINAL A:   Does not appear to be an active problem although transaminases are mildly increased. P:   Consider resuming TF tomorrow. Follow LFTs.  HEMATOLOGIC A:   [place] anemia and chronic P:  No acute need for tx  INFECTIOUS A:   Recurrent aspiration pneumonia. N.B. WBC was 21.1 11 days ago P:   Abx regimen is adequate. 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

## 2016-12-09 NOTE — ED Notes (Signed)
Patient pulled left hand IV out

## 2016-12-09 NOTE — ED Triage Notes (Signed)
Pt arrived via [facility] from [place] and rehab c/o change in mental status and drainage from trach.  Left side needle decompression.  Wears 2L O2 trach collar at home.

## 2016-12-09 NOTE — ED Notes (Signed)
O2 in 80's called respiratory to bedside

## 2016-12-09 NOTE — ED Notes (Signed)
Patient remains on monitor with trach collar. Patient is non verbal and is not following commands at this time. He does open eyes when he is spoken to. Patient has received sepsis protocol. Patient does not appear to be in any pain based on facial expressions. He is calm at this time.

## 2016-12-09 NOTE — ED Notes (Signed)
Patient's trach suctioned.

## 2016-12-09 NOTE — Progress Notes (Signed)
   Family currently in ED Consult A.

## 2016-12-09 NOTE — ED Notes (Signed)
Check CBG 144, RN Cricket informed

## 2016-12-09 NOTE — H&P (Signed)
History and Physical    Scott Rivera GHW:[id number] DOB: 06/21/1950 DOA: 12/09/2016  PCP: Garwin Brothers, MD Patient coming from: [facility]  I have personally extensively reviewed the patient's medical's record in [place] and in our system  Chief Complaint: Unresponsive and hypoxic at [place] found this morning approximately 3 AM  HPI: Scott Rivera is a 66 y.o. 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 He now presents with hypoxemia fever leukocytosis and what looks like tube feeds drainage from his trach indicative of severe sepsis with aspiration pneumonia.   He was initially admitted to [facility] on July 1 and transferred to our facility on July 5 and discharged on August 18. During that hospitalization he had difficulties with kidney injury, hypernatremia, and severe leukocytosis. At discharge his white blood cell count was 21. Thought to be due to underlying problem with leukocytosis and a hematology appointment was arranged. Please see below for further discussion of past medical history.  Although the history is obtained from the patient's sister and the ER physician and the record. Patient is unable to provide any history whatsoever.  Today, Scott Rivera was found unresponsive in the early hours of this morning at the nursing facility with sats in the 80s. EMS was called and they concurred with the evaluation from the nursing facility on route he was noted to have absent breath sounds on the left and a needle decompression was performed on route. Nursing home staff reported to EMS that they had suctioned what appeared to be tube feeding out of his trach. On arrival to the emergency department he was found to be febrile to 101, he had a  heart rate of 152, his blood pressure was 119/55 and his respiratory rate was 38. He received 2 L of fluid resuscitation and his blood pressure dropped to 96/54 but then started to improve.  ED Course: The patient was rapidly assessed and treated with IV fluids for sepsis and given cefepime and vancomycin for presumed facility acquired aspiration pneumonia. Chest x-ray did show a pneumonia and his white count was noted to be 27.6, initially his lactic acid was 2.94 but improved to 1.88. Hemoglobin on presentation was 9.2 but after hydration dropped to 7.8. Creatinine on presentation was 2.47 with a GFR of 30 but improved to 2.1 after hydration. His potassium was also elevated at 6.2 but improved to 4.4. His sodium was 154 on presentation and increased to 158 after hydration his glucose was elevated at 152 and he remained hyperglycemic after hydration to [place] the patient's sister states that he woke up and was laughing with her however when I saw the patient he was completely unresponsive.  The patient's sister states that he has been improving while at the nursing facility and that his baseline was unresponsive and the nail he can lift up his legs and occasionally points with his fingers and moves his mouth in an attempt to communicate. He remains nonverbal with a trach collar and PEG tube. He has not been able to have his diet advanced yet with speech therapy.  Review of Systems: Unable to perform review of systems as patient is unresponsive and currently noncommunicative with me  Past Medical History:  Diagnosis Date  . Anemia due to chronic kidney disease   . Aspiration pneumonia ([place])   .  Chronic kidney disease, stage 3 (moderate)   . Hypertension   . Stroke [facility])     Past Surgical History:  Procedure Laterality Date  . IR GASTROSTOMY TUBE MOD SED  10/27/2016  . KNEE SURGERY    . TRACHEOSTOMY TUBE PLACEMENT        reports that he has quit smoking. He has never used smokeless tobacco.  He reports that he drinks alcohol. His drug history is not on file.  No Known Allergies  Family History  Problem Relation Age of Onset  . Diabetes Mellitus II Brother   . CAD Neg Hx   . Stroke Neg Hx    Unacceptable: Noncontributory, unremarkable, or negative. Acceptable: Family history reviewed and not pertinent (If you reviewed it)  Prior to Admission medications   Medication Sig Start Date End Date Taking? Authorizing Provider  Amino Acids-Protein Hydrolys (FEEDING SUPPLEMENT, PRO-STAT SUGAR FREE 64,) LIQD Place 30 mLs into feeding tube 3 (three) times daily. 11/28/16   Mariel Aloe, MD  amLODipine (NORVASC) 10 MG tablet Place 1 tablet (10 mg total) into feeding tube daily. 11/29/16   Mariel Aloe, MD  aspirin 325 MG tablet Place 1 tablet (325 mg total) into feeding tube daily. 10/25/16   Reyne Dumas, MD  bethanechol (URECHOLINE) 10 MG tablet Place 1 tablet (10 mg total) into feeding tube 3 (three) times daily. 10/25/16   Reyne Dumas, MD  cloNIDine (CATAPRES) 0.1 MG tablet Place 1 tablet (0.1 mg total) into feeding tube 2 (two) times daily. 11/28/16   Mariel Aloe, MD  clopidogrel (PLAVIX) 75 MG tablet Place 1 tablet (75 mg total) into feeding tube daily. 10/25/16   Reyne Dumas, MD  famotidine (PEPCID) 40 MG/5ML suspension Place 2.5 mLs (20 mg total) into feeding tube 2 (two) times daily. 11/28/16   Mariel Aloe, MD  metoCLOPramide (REGLAN) 5 MG/5ML solution Place 10 mLs (10 mg total) into feeding tube 4 (four) times daily -  before meals and at bedtime. 11/28/16   Mariel Aloe, MD  metoprolol tartrate (LOPRESSOR) 25 mg/10 mL SUSP Place 10 mLs (25 mg total) into feeding tube 2 (two) times daily. 10/25/16   Reyne Dumas, MD  Multiple Vitamin (MULTIVITAMIN WITH MINERALS) TABS tablet Place 1 tablet into feeding tube daily. 11/29/16   Mariel Aloe, MD  nicotine (NICODERM CQ - DOSED IN MG/24 HOURS) 14 mg/24hr patch Place 1 patch (14 mg total) onto the skin daily. 10/25/16    Reyne Dumas, MD  Nutritional Supplements (FEEDING SUPPLEMENT, JEVITY 1.2 CAL,) LIQD Place 1,000 mLs into feeding tube continuous. 10/25/16   Reyne Dumas, MD  Nutritional Supplements (FEEDING SUPPLEMENT, JEVITY 1.5 CAL/FIBER,) LIQD Place 1,000 mLs into feeding tube daily. 60ml/hr 11/28/16   Mariel Aloe, MD  pravastatin (PRAVACHOL) 20 MG tablet Place 1 tablet (20 mg total) into feeding tube daily at 6 PM. 10/25/16   Reyne Dumas, MD  thiamine 100 MG tablet Place 1 tablet (100 mg total) into feeding tube daily. 11/29/16   Mariel Aloe, MD  traMADol (ULTRAM) 50 MG tablet Take 1 tablet (50 mg total) by mouth 2 (two) times daily as needed for severe pain. 10/25/16   Reyne Dumas, MD  Water For Irrigation, Sterile (FREE WATER) SOLN Place 300 mLs into feeding tube every 4 (four) hours. 10/25/16   Reyne Dumas, MD    Physical Exam: Vitals:   12/09/16 0716 12/09/16 0800 12/09/16 0845 12/09/16 0910  BP:  (!) 100/49 (!) 132/57   Pulse:  Marland Kitchen)  117 (!) 121 (!) 126  Resp:  (!) 25 (!) 25 (!) 28  Temp: 99.2 F (37.3 C)     TempSrc: Temporal     SpO2:  93% 97% 99%  Weight:      Height:       .TCS Constitutional: Thin African-American male does not respond to voice or commands diaphoretic and appears uncomfortable on a 40% trach collar Vitals:   12/09/16 0716 12/09/16 0800 12/09/16 0845 12/09/16 0910  BP:  (!) 100/49 (!) 132/57   Pulse:  (!) 117 (!) 121 (!) 126  Resp:  (!) 25 (!) 25 (!) 28  Temp: 99.2 F (37.3 C)     TempSrc: Temporal     SpO2:  93% 97% 99%  Weight:      Height:       Eyes: PERRL, lids and conjunctivae normal ENMT: Mucous membranes are Dry. Posterior pharynx clear of any exudate or lesions.Normal dentition.  Neck: normal, supple, no masses, no thyromegaly, trach in place with both tan and mucinous secretions Respiratory: Bilateral coarse breath sounds with slight end expiratory wheezing. Decreased breath sounds at the right lower lobe with rhonchi bilaterally  diffusely Cardiovascular: Regular rate and rhythm, no murmurs / rubs / gallops. No extremity edema. 2+ pedal pulses. No carotid bruits.  Abdomen: no tenderness, no masses palpated. No hepatosplenomegaly. Bowel sounds positive. PEG tube in place, no erythema at the site of the tube insertion Genitourinary: Foley catheter in place no ulcerations or lesions in the anterior groin area  Musculoskeletal: no clubbing / cyanosis. No joint deformity upper extremities. Evidence of bilateral long-standing knee arthritis with enlarged joints  poor tone of muscles, poor range of motion with stiffening of the lower extremities  Skin: no rashes, no induration, unable to evaluate posterior skin for ulcerations Neurologic: Unresponsive, does not follow commands eyes are open but does not appear to be alert oriented or awake    Labs on Admission: I have personally reviewed following labs and imaging studies  CBC:  Recent Labs Lab 12/09/16 0350 12/09/16 0614  WBC 27.6*  --   NEUTROABS 22.3*  --   HGB 9.2* 7.8*  HCT 30.7* 23.0*  MCV 94.5  --   PLT 836*  --    Basic Metabolic Panel:  Recent Labs Lab 12/09/16 0350 12/09/16 0614  NA 154* 158*  K 6.2* 4.4  CL 115* 125*  CO2 26  --   GLUCOSE 152* 119*  BUN 66* 55*  CREATININE 2.47* 2.10*  CALCIUM 11.4*  --    GFR: Estimated Creatinine Clearance: 34.8 mL/min (A) (by C-G formula based on SCr of 2.1 mg/dL (H)). Liver Function Tests:  Recent Labs Lab 12/09/16 0350  AST 62*  ALT 75*  ALKPHOS 230*  BILITOT 1.1  PROT 9.5*  ALBUMIN 2.2*   No results for input(s): LIPASE, AMYLASE in the last 168 hours. No results for input(s): AMMONIA in the last 168 hours. Coagulation Profile: No results for input(s): INR, PROTIME in the last 168 hours. Cardiac Enzymes:  Recent Labs Lab 12/09/16 0350  TROPONINI 0.03*   BNP (last 3 results) No results for input(s): PROBNP in the last 8760 hours. HbA1C: No results for input(s): HGBA1C in the last 72  hours. CBG:  Recent Labs Lab 12/09/16 0426  GLUCAP 144*   Lipid Profile: No results for input(s): CHOL, HDL, LDLCALC, TRIG, CHOLHDL, LDLDIRECT in the last 72 hours. Thyroid Function Tests: No results for input(s): TSH, T4TOTAL, FREET4, T3FREE, THYROIDAB in the last 72  hours. Anemia Panel: No results for input(s): VITAMINB12, FOLATE, FERRITIN, TIBC, IRON, RETICCTPCT in the last 72 hours. Urine analysis:    Component Value Date/Time   COLORURINE YELLOW 12/09/2016 0416   APPEARANCEUR HAZY (A) 12/09/2016 0416   LABSPEC 1.016 12/09/2016 0416   PHURINE 6.0 12/09/2016 0416   GLUCOSEU NEGATIVE 12/09/2016 0416   HGBUR NEGATIVE 12/09/2016 0416   BILIRUBINUR NEGATIVE 12/09/2016 0416   KETONESUR NEGATIVE 12/09/2016 0416   PROTEINUR 30 (A) 12/09/2016 0416   NITRITE NEGATIVE 12/09/2016 0416   LEUKOCYTESUR NEGATIVE 12/09/2016 0416    Radiological Exams on Admission: Dg Chest Port 1 View  Result Date: 12/09/2016 CLINICAL DATA:  Pneumonia. EXAM: PORTABLE CHEST 1 VIEW COMPARISON:  Radiograph 11/20/2016 FINDINGS: Tracheostomy tube at the thoracic inlet. Worsening patchy right lung base opacity. Unchanged left basilar opacity. Upper lungs are clear, the patient's chin obscures the right lung apex. Unchanged heart size and mediastinal contours. No large pleural effusion. No pneumothorax. Tubular catheter like density projecting over the left upper chest is presumably external to the patient. IMPRESSION: Increasing right lung base opacity. This may be pneumonia, aspiration or atelectasis. Left basilar opacity is unchanged. Electronically Signed   By: Jeb Levering M.D.   On: 12/09/2016 04:24    EKG: Independently reviewed. Sinus tachycardia with PACs, left atrial enlargement and RSR prime.  Assessment/Plan Principal Problem:   Severe sepsis with acute organ dysfunction (HCC) Active Problems:   Aspiration pneumonia (HCC)   Acute-on-chronic kidney injury ([place])   Elevated troponin    Compensated metabolic alkalosis   Dysphagia, post-stroke   Tachypnea   Hypernatremia   Hypotension   Anemia due to chronic kidney disease   Essential hypertension   Tobacco abuse   Hyperglycemia   Leukocytosis (leucocytosis)   Goals of care, counseling/discussion   1. Severe sepsis with acute organ dysfunction: Patient will be admitted to the stepdown unit presentation to the emergency department was consistent with severe sepsis with hypotension and organ system dysfunction. He has received vancomycin and cefepime in the emergency department. Has a severe leukocytosis. His lactic acid resolved with IV fluid. Source of infection is felt to be aspiration pneumonia. Nursing will continue to suction as necessary.  I am concerned that the patient will decompensate especially after getting fluids with his aspiration pneumonia. I'm worried that he will become more compromised from a pulmonary status due to increased pulmonary edema. I have consult critical care for further evaluations and recommendations.  2. Aspiration pneumonia: This is at least the patient's third aspiration pneumonia since his stroke on July 1. Tube was placed on July 12. Pneumonia noted in the right lower lobe and physical examination consistent with extensive pneumonia in multiple lobes. Cefepime and vancomycin have been started. Nursing will frequently suction his trach. will continue oxygen support at 40% trach collar.  3. Acute on chronic kidney injury: Patient discharged from the hospital on August 15 with a creatinine of 1.34. Creatinine on presentation today was 2.47 improved to 2.1 after IV fluids. We'll continue with fluid hydration and avoid nephrotoxic agents. We'll monitor vancomycin levels while receiving that antibiotic. If creatinine does not improve renal consultation may be helpful. I believe he has underlying kidney disease due to chronic hypertension.  4. Elevated troponin: Patient likely with demand ischemia.  We'll obtain 2 more sets of troponins and check a repeat EKG in a.m. He is already receiving aspirin and Plavix after his CVA. EKG does not show any acute changes. There is some borderline ST segment elevation  in the anterior leads but when compared to August 14 that does not appear to be appreciably different.  5. Compensated metabolic alkalosis: This is of course very concerning in a patient with severe pneumonia. Patient would be expected to be acidotic with a severe pneumonia. Will repeat arterial blood gas. Monitor pH closely at least daily for the next 2 days.  6. Dysphasia post stroke: Patient has a PEG tube placed and is receiving tube feedings at the nursing facility as well as all of his medications through his tube. We will hold tube feedings and give medications IV where able. Unfortunately I cannot stop his aspirin and Plavix due to severity of his stroke. We will continue these via PEG tube. Once patient has improved would recommend speech therapy evaluation to consider if he is a candidate to advance diet.  7. Tachypnea: This is likely related to his acute respiratory decompensation from aspiration pneumonia. Continue to monitor closely.  8. Hypernatremia:  Patient's last hospitalization was played with episodes of hypernatremia. It may be that he requires more free water. Unfortunately his sodium level increased with administration of normal saline in the emergency department. We'll give patient half normal saline while in the hospital for the next 24 hours. Will monitor response to treatment.   9. Hypotension: Blood pressures have improved in the emergency department but remained in the low 100s. This person has a history of hypertension and was on 2 blood pressure medications prior to his stroke. This is concerning development and we will monitor his blood pressures closely. For the time being will hold home antihypertensives.  10. Anemia due to chronic kidney disease: Patient is  compromise requiring increased oxygenation and his hemoglobin has dropped to 7 range. I believe in this situation he would benefit from transfusion of 2 units of packed red blood cells. I received consent from his sister who is his Media planner. Her name is [name] and she can be reached at home at area code [phone] her cell phone number is area code [phone].  11. Essential hypertension: Given patient's low blood pressures home blood pressure medications will be held for now. They may be restarted when his blood pressures improve.  12. Tobacco abuse: Patient remains on a nicotine patch at the facility. Given his degree of illness will hold this for now especially since he remains markedly tachycardic. Will monitor and consider restarting nicotine patch if patient shows signs of nicotine would crawl.  13.  Hyperglycemia: Patient has had hyperglycemia as far back as April 2018 noted on his office visit in care everywhere. At that point his glucose was 108 fasting. He has had persistently elevated blood glucoses while here at the hospital during his last admission. We will check fingerstick blood glucoses every morning while he is nothing by mouth. Also will check a hemoglobin A1c. Patient does have a family history of diabetes and may be at risk of developing type 2 diabetes.  14. Leukocytosis: Patient was discharged with an appointment to follow-up with hematology given his leukocytosis. However reviewing care everywhere shows that on 07/21/2016 his white blood cell count was 9.4. Given his multiple infections severity of illness I doubt that this is an acute conversion to a bone marrow disorder or myelodysplastic syndrome. He does not appear to have CML. I believe this reflects 2 problems. Firstly it appears that the patient has been in a chronic state of physical body stress and he may have some D margination with an acute phase reaction  from his white cells. He also may have some chronic  underlying infections. With a low-grade inflammation from possible chronic aspiration. Will monitor white blood cell count closely. Consider bone marrow evaluation to rule out any bone marrow disorders by hematology while the patient is in the hospital at the present time if he does not improve.  15. Goals of care: I spent 30 minutes on the phone with the patient's sister. She is his Media planner. She was shocked to hear how ill he was and was told by the emergency department that the patient had a pneumonia. She did not realize he had sepsis or hypotension or respiratory failure. He is also hoping that the patient will make a complete recovery to the point where he can feed himself and walk around. I discussed with her that that may not be possible given the extensive nature of the stroke he suffered. She states that he has been making improvements in that he can point to things and she states that he was laughing at statements her husband made this morning. When I saw the patient he was completely unresponsive. He was riding his head and looking around but he did not appear to be really awake or alert. She would like for him to continue to be a full code. I have consult to palliative care to assist in educating her in the patient's realistic expectations for recovery. In addition I had extensive discussion with her about aspiration pneumonia. Is at least his third aspiration pneumonia since his stroke and second since placement of his G-tube. I explained to her that he will continue to have problems with aspiration even if he recovers from this particular episode. She was very dismayed to hear this and was upset at the prognosis. We discussed that she might want to consider if she herself was in this condition what she would want for herself. Her husband who was also on the phone at the same time expressed concerns about his brother-in-law's condition and whether he himself would want to be in his condition.  He stated that he had remove life support from his daughter after she had a ruptured brain aneurysm in [place] about 2 years ago. He also stated that he's lost another daughter and 4 sisters. We discussed the nature of care and that we can do a lot of things to keep the patient's body alive but that it was unlikely that he would be able to return to his previous level of brain function. I stated that I felt that it would be reasonable to expect the patient to do in the nursing home and have continued episodes of decline and periods of worsening illness with a slow steady decline. I briefly mentioned hospice care as a possibility. I have consult to palliative care to have ongoing discussions with the patient's family. He remains a full code for now.   I have spent 3 hours and 8 minutes on care of this patient. He remains acutely and critically ill with extensive needs regarding his acute respiratory failure, severe sepsis and aspiration pneumonia as well as total care for his ADLs.  Time in 7:15 AM time out 10:23 AM  DVT prophylaxis: Lovenox  Code Status: Full CODE STATUS at the present time Family Communication: I spoke with patient's sister [name] telephone number [phone], cell phone [phone]. Simultaneously her husband Jill Side was also on the telephone. Please see above. Disposition Plan: Back to skilled facility Consults called: Critical care I spoke with  both Dr. Pia Mau and Dr. Rennis Harding. Admission status: Inpatient   Lady Deutscher MD, FACP [facility] Pager [phone]  If 7PM-7AM, please contact night-coverage [email or web address] Password [email or web address]  12/09/2016, 9:49 AM

## 2016-12-09 NOTE — ED Provider Notes (Addendum)
TIME SEEN: 3:48 AM  CHIEF COMPLAINT: Unresponsive  HPI: Patient is a 66 year old male with history of hypertension, CVA, recurrent aspiration requiring tracheostomy and G-tube lives in a nursing home who presents to the emergency department after EMS was called out for patient being found unresponsive. Patient normally is not able to talk or care for himself per EMS. Was found with a GCS of 3 by nursing home staff and completely unresponsive. EMS states that his oxygen saturations with a trach collar were in the 80s. They believe that he wears 2 L of oxygen regularly. He comes from [facility]. They were concerned because they had no air movement in the left lung. Needle decompression was performed in route. EMS reports that nursing home staff with suctioning what appeared to be food out of his trach today.  ROS: Level V due to mental status  PAST MEDICAL HISTORY/PAST SURGICAL HISTORY:  Past Medical History:  Diagnosis Date  . Hypertension   . Stroke [facility])     MEDICATIONS:  Prior to Admission medications   Medication Sig Start Date End Date Taking? Authorizing Provider  Amino Acids-Protein Hydrolys (FEEDING SUPPLEMENT, PRO-STAT SUGAR FREE 64,) LIQD Place 30 mLs into feeding tube 3 (three) times daily. 11/28/16   Mariel Aloe, MD  amLODipine (NORVASC) 10 MG tablet Place 1 tablet (10 mg total) into feeding tube daily. 11/29/16   Mariel Aloe, MD  aspirin 325 MG tablet Place 1 tablet (325 mg total) into feeding tube daily. 10/25/16   Reyne Dumas, MD  bethanechol (URECHOLINE) 10 MG tablet Place 1 tablet (10 mg total) into feeding tube 3 (three) times daily. 10/25/16   Reyne Dumas, MD  cloNIDine (CATAPRES) 0.1 MG tablet Place 1 tablet (0.1 mg total) into feeding tube 2 (two) times daily. 11/28/16   Mariel Aloe, MD  clopidogrel (PLAVIX) 75 MG tablet Place 1 tablet (75 mg total) into feeding tube daily. 10/25/16   Reyne Dumas, MD  famotidine (PEPCID) 40 MG/5ML suspension Place 2.5  mLs (20 mg total) into feeding tube 2 (two) times daily. 11/28/16   Mariel Aloe, MD  metoCLOPramide (REGLAN) 5 MG/5ML solution Place 10 mLs (10 mg total) into feeding tube 4 (four) times daily -  before meals and at bedtime. 11/28/16   Mariel Aloe, MD  metoprolol tartrate (LOPRESSOR) 25 mg/10 mL SUSP Place 10 mLs (25 mg total) into feeding tube 2 (two) times daily. 10/25/16   Reyne Dumas, MD  Multiple Vitamin (MULTIVITAMIN WITH MINERALS) TABS tablet Place 1 tablet into feeding tube daily. 11/29/16   Mariel Aloe, MD  nicotine (NICODERM CQ - DOSED IN MG/24 HOURS) 14 mg/24hr patch Place 1 patch (14 mg total) onto the skin daily. 10/25/16   Reyne Dumas, MD  Nutritional Supplements (FEEDING SUPPLEMENT, JEVITY 1.2 CAL,) LIQD Place 1,000 mLs into feeding tube continuous. 10/25/16   Reyne Dumas, MD  Nutritional Supplements (FEEDING SUPPLEMENT, JEVITY 1.5 CAL/FIBER,) LIQD Place 1,000 mLs into feeding tube daily. 40ml/hr 11/28/16   Mariel Aloe, MD  pravastatin (PRAVACHOL) 20 MG tablet Place 1 tablet (20 mg total) into feeding tube daily at 6 PM. 10/25/16   Reyne Dumas, MD  thiamine 100 MG tablet Place 1 tablet (100 mg total) into feeding tube daily. 11/29/16   Mariel Aloe, MD  traMADol (ULTRAM) 50 MG tablet Take 1 tablet (50 mg total) by mouth 2 (two) times daily as needed for severe pain. 10/25/16   Reyne Dumas, MD  Water For Irrigation,  Sterile (FREE WATER) SOLN Place 300 mLs into feeding tube every 4 (four) hours. 10/25/16   Reyne Dumas, MD    ALLERGIES:  No Known Allergies  SOCIAL HISTORY:  Social History  Substance Use Topics  . Smoking status: Current Every Day Smoker  . Smokeless tobacco: Never Used  . Alcohol use Yes    FAMILY HISTORY: Family History  Problem Relation Age of Onset  . Diabetes Mellitus II Brother   . CAD Neg Hx   . Stroke Neg Hx     EXAM: BP (!) 119/55   Pulse (!) 152   Temp (!) 101.1 F (38.4 C) (Rectal)   Resp (!) 34   Ht 5\' 11"  (1.803 m)    Wt 71.2 kg (157 lb)   BMI 21.90 kg/m  CONSTITUTIONAL: Patient is alert but does not answer questions or follow commands, he has his eyes open, febrile, cachectic, chronically ill-appearing HEAD: Normocephalic, atraumatic EYES: Conjunctivae clear, pupils appear equal, EOMI ENT: normal nose; moist mucous membranes; trach in place with a large amount of yellow drainage noted around the dressing around the trach NECK: Supple, no meningismus, no nuchal rigidity, no LAD  CARD: Regular and tachycardic; S1 and S2 appreciated; no murmurs, no clicks, no rubs, no gallops RESP: Diminished aeration diffusely especially in the left lung, needle can decompression noted to the left anterior chest, rhonchorous breath sounds heard in the right lung, no wheezing or rales, no hypoxia on trach collar ABD/GI: Normal bowel sounds; non-distended; soft, non-tender, no rebound, no guarding, no peritoneal signs, no hepatosplenomegaly BACK:  The back appears normal and is non-tender to palpation, there is no CVA tenderness EXT: Normal ROM in all joints; non-tender to palpation; no edema; normal capillary refill; no cyanosis, no calf tenderness or swelling    SKIN: Normal color for age and race; warm; no rash NEURO: Does not answer questions or follow commands, keeps his eyes open  MEDICAL DECISION MAKING: Patient here after being found unresponsive. EMS reports he became more responsive and round and is at his neurologic baseline. He is found to be febrile here. Suspect pneumonia. Suspect aspiration. We'll give IV fluids, broad spectrum antibiotics, rectal Tylenol. Patient will need admission. Will obtain labs, cultures, chest x-ray, urine.  ED PROGRESS: Patient's sister is now at bedside. She reports that patient has not been able to talk or walk on his own since a stroke in July that required admission at [facility]. He is living in a nursing facility. She reports he is currently at his neurologic  baseline. His labs show significant leukocytosis of 27,000. He has an elevated lactate.   6:34 AM Discussed patient's case with hospitalist, Dr. Alcario Drought.  I have recommended admission and patient (and family if present) agree with this plan. Admitting physician will place admission orders.   I reviewed all nursing notes, vitals, pertinent previous records, EKGs, lab and urine results, imaging (as available).    Labs show hypernatremia and this has been confirmed. Repeat potassium normal at 4.4. Patient has acute renal failure. He has chronic anemia. Lactate improving.   7:20 AM  D/w Dr. Evangeline Gula with hospitalist service. Patient's lactate has improved. He is still hemodynamically stable. No hypotension. He is not requiring a ventilator. I feel he is stable for stepdown.   EKG Interpretation  Date/Time:  Saturday December 09 2016 03:59:21 EDT Ventricular Rate:  144 PR Interval:    QRS Duration: 83 QT Interval:  225 QTC Calculation: 349 R Axis:   82  Text Interpretation:  Sinus tachycardia Left atrial enlargement Consider right ventricular hypertrophy Minimal ST elevation, anterolateral leads Artifact in lead(s) I II III aVR aVL aVF V1 V2 V3 V6 and baseline wander in lead(s) V3 Confirmed by Pryor Curia [phone]) on 12/09/2016 4:29:03 AM       EKG Interpretation  Date/Time:  Saturday December 09 2016 05:28:03 EDT Ventricular Rate:  125 PR Interval:    QRS Duration: 91 QT Interval:  284 QTC Calculation: 410 R Axis:   77 Text Interpretation:  Sinus tachycardia Atrial premature complex Probable left atrial enlargement RSR' in V1 or V2, right VCD or RVH Borderline ST elevation, anterior leads Artifact in lead(s) I V1 V2 Confirmed by Pryor Curia [phone]) on 12/09/2016 6:49:54 AM         CRITICAL CARE Performed by: Nyra Jabs   Total critical care time: 65 minutes  Critical care time was exclusive of separately billable procedures and treating other patients.  Critical care  was necessary to treat or prevent imminent or life-threatening deterioration.  Critical care was time spent personally by me on the following activities: development of treatment plan with patient and/or surrogate as well as nursing, discussions with consultants, evaluation of patient's response to treatment, examination of patient, obtaining history from patient or surrogate, ordering and performing treatments and interventions, ordering and review of laboratory studies, ordering and review of radiographic studies, pulse oximetry and re-evaluation of patient's condition.     Joellyn Grandt, Delice Bison, DO 12/09/16 [place], Delice Bison, DO 12/09/16 [phone]

## 2016-12-09 NOTE — Progress Notes (Signed)
Pharmacy Antibiotic Note  Scott Rivera is a 66 y.o. male admitted on 12/09/2016 with pneumonia.  Pharmacy has been consulted for vancomycin and cefepime dosing. Of note, patient from [facility] rehab with chronic trach.  Tmax 101.1, WBC 27.6, and LA 2.94. SCr 2.1 for estimated CrCl ~ 30-35 mL/min.  Plan: Vancomycin 1.25g IV x1, then 1g IV q24hr Cefepime 2g IV q24hr Vancomycin trough at SS and PRN (goal 15-20 mcg/mL) Monitor renal function clinical picture and culture results  Height: 5\' 11"  (180.3 cm) Weight: 157 lb (71.2 kg) IBW/kg (Calculated) : 75.3  Temp (24hrs), Avg:101.1 F (38.4 C), Min:101.1 F (38.4 C), Max:101.1 F (38.4 C)  No results for input(s): WBC, CREATININE, LATICACIDVEN, VANCOTROUGH, VANCOPEAK, VANCORANDOM, GENTTROUGH, GENTPEAK, GENTRANDOM, TOBRATROUGH, TOBRAPEAK, TOBRARND, AMIKACINPEAK, AMIKACINTROU, AMIKACIN in the last 168 hours.  Estimated Creatinine Clearance: 54.6 mL/min (A) (by C-G formula based on SCr of 1.34 mg/dL (H)).    No Known Allergies  Antimicrobials this admission: 8/25 Cefepime >>  8/25 Vanc >>   Microbiology results: pending   Argie Ramming, PharmD Clinical Pharmacist 12/09/16 6:42 AM

## 2016-12-09 NOTE — Progress Notes (Addendum)
Initial Nutrition Assessment  DOCUMENTATION CODES:   Severe malnutrition in context of chronic illness  INTERVENTION:   When able to resume TF via PEG, start:  Jevity 1.5 at 50 ml/h (1200 ml per day)  Pro-stat 30 ml BID  Provides 2000 kcal, 107 gm protein, 912 ml free water daily  When IVF d/c, recommend 200 ml free water every 4 hours for total intake of 2112 ml free water daily  NUTRITION DIAGNOSIS:   Malnutrition (severe) related to chronic illness (respiratory failure with trach, stroke, aspiration PNA) as evidenced by severe depletion of muscle mass, severe depletion of body fat, percent weight loss.  GOAL:   Patient will meet greater than or equal to 90% of their needs  MONITOR:   TF tolerance, Skin, Labs, I & O's  REASON FOR ASSESSMENT:   Consult Enteral/tube feeding initiation and management  ASSESSMENT:   66 yo male with PMH of trach & PEG, stroke, HTN, aspiration PNA, CKD-3, anemia who was admitted from nursing facility with severe sepsis and aspiration PNA.  Received MD Consult for TF recommendations. Patient unable to provide any nutrition hx. PTA patient received Jevity 1.5 TF formula (per review of home medications), unsure of rate or regimen.   RN reports that his PEG tube was clogged when he was admitted this morning, but she was able to unclog it.  Nutrition-Focused physical exam completed. Findings are mild/moderate and severe fat depletion, mild/moderate and severe muscle depletion, and no edema.   Weight has decreased by 18 lbs (11.5%) over the past 10 days.  Labs reviewed: 158 (H) Medications reviewed and include Reglan and Pepcid.  Diet Order:  Diet NPO time specified  Skin:  Wound (see comment) (stage II shoulder, head, elbow, sacrum)  Last BM:  unknown  Height:   Ht Readings from Last 1 Encounters:  12/09/16 5\' 11"  (1.803 m)    Weight:   Wt Readings from Last 1 Encounters:  12/09/16 139 lb 15.9 oz (63.5 kg)    Ideal Body  Weight:  78.2 kg  BMI:  Body mass index is 19.52 kg/m.  Estimated Nutritional Needs:   Kcal:  1900-2100  Protein:  95-110 gm  Fluid:  2 L  EDUCATION NEEDS:   No education needs identified at this time  Molli Barrows, [place], [place], [place] Pager [phone] After Hours Pager [phone]

## 2016-12-09 NOTE — Progress Notes (Signed)
Troponin now 0.17. Messaged attending MD

## 2016-12-09 NOTE — ED Notes (Signed)
Admitting at bedside 

## 2016-12-10 ENCOUNTER — Inpatient Hospital Stay (HOSPITAL_COMMUNITY): Payer: Medicare HMO

## 2016-12-10 DIAGNOSIS — S270XXA Traumatic pneumothorax, initial encounter: Secondary | ICD-10-CM

## 2016-12-10 DIAGNOSIS — I69391 Dysphagia following cerebral infarction: Secondary | ICD-10-CM

## 2016-12-10 DIAGNOSIS — Z515 Encounter for palliative care: Secondary | ICD-10-CM

## 2016-12-10 DIAGNOSIS — R6520 Severe sepsis without septic shock: Secondary | ICD-10-CM

## 2016-12-10 DIAGNOSIS — Z7189 Other specified counseling: Secondary | ICD-10-CM

## 2016-12-10 DIAGNOSIS — A419 Sepsis, unspecified organism: Principal | ICD-10-CM

## 2016-12-10 HISTORY — DX: Traumatic pneumothorax, initial encounter: S27.0XXA

## 2016-12-10 LAB — BASIC METABOLIC PANEL
Anion gap: 11 (ref 5–15)
BUN: 34 mg/dL — ABNORMAL HIGH (ref 6–20)
CO2: 20 mmol/L — ABNORMAL LOW (ref 22–32)
Calcium: 11.1 mg/dL — ABNORMAL HIGH (ref 8.9–10.3)
Chloride: 122 mmol/L — ABNORMAL HIGH (ref 101–111)
Creatinine, Ser: 1.8 mg/dL — ABNORMAL HIGH (ref 0.61–1.24)
GFR calc Af Amer: 44 mL/min — ABNORMAL LOW (ref >60)
GFR calc non Af Amer: 38 mL/min — ABNORMAL LOW (ref >60)
Glucose, Bld: 55 mg/dL — ABNORMAL LOW (ref 65–99)
Potassium: 4.3 mmol/L (ref 3.5–5.1)
Sodium: 153 mmol/L — ABNORMAL HIGH (ref 135–145)

## 2016-12-10 LAB — BLOOD GAS, ARTERIAL
Acid-base deficit: 1.1 mmol/L (ref 0.0–2.0)
Bicarbonate: 22.7 mmol/L (ref 20.0–28.0)
Drawn by: 414221
FIO2: 35
O2 Saturation: 95.5 %
Patient temperature: 98.6
pCO2 arterial: 35.4 mmHg (ref 32.0–48.0)
pH, Arterial: 7.423 (ref 7.350–7.450)
pO2, Arterial: 75.5 mmHg — ABNORMAL LOW (ref 83.0–108.0)

## 2016-12-10 LAB — TYPE AND SCREEN
ABO/RH(D): O POS
Antibody Screen: NEGATIVE
Unit division: 0
Unit division: 0

## 2016-12-10 LAB — COMPREHENSIVE METABOLIC PANEL WITH GFR
ALT: 55 U/L (ref 17–63)
AST: 30 U/L (ref 15–41)
Albumin: 2 g/dL — ABNORMAL LOW (ref 3.5–5.0)
Alkaline Phosphatase: 192 U/L — ABNORMAL HIGH (ref 38–126)
Anion gap: 10 (ref 5–15)
BUN: 38 mg/dL — ABNORMAL HIGH (ref 6–20)
CO2: 23 mmol/L (ref 22–32)
Calcium: 11 mg/dL — ABNORMAL HIGH (ref 8.9–10.3)
Chloride: 124 mmol/L — ABNORMAL HIGH (ref 101–111)
Creatinine, Ser: 1.77 mg/dL — ABNORMAL HIGH (ref 0.61–1.24)
GFR calc Af Amer: 44 mL/min — ABNORMAL LOW
GFR calc non Af Amer: 38 mL/min — ABNORMAL LOW
Glucose, Bld: 67 mg/dL (ref 65–99)
Potassium: 4.1 mmol/L (ref 3.5–5.1)
Sodium: 157 mmol/L — ABNORMAL HIGH (ref 135–145)
Total Bilirubin: 0.6 mg/dL (ref 0.3–1.2)
Total Protein: 8.4 g/dL — ABNORMAL HIGH (ref 6.5–8.1)

## 2016-12-10 LAB — GLUCOSE, CAPILLARY
Glucose-Capillary: 82 mg/dL (ref 65–99)
Glucose-Capillary: 87 mg/dL (ref 65–99)
Glucose-Capillary: 89 mg/dL (ref 65–99)
Glucose-Capillary: 99 mg/dL (ref 65–99)

## 2016-12-10 LAB — CBC
HCT: 32.8 % — ABNORMAL LOW (ref 39.0–52.0)
Hemoglobin: 9.9 g/dL — ABNORMAL LOW (ref 13.0–17.0)
MCH: 28.2 pg (ref 26.0–34.0)
MCHC: 30.2 g/dL (ref 30.0–36.0)
MCV: 93.4 fL (ref 78.0–100.0)
Platelets: 651 K/uL — ABNORMAL HIGH (ref 150–400)
RBC: 3.51 MIL/uL — ABNORMAL LOW (ref 4.22–5.81)
RDW: 17.8 % — ABNORMAL HIGH (ref 11.5–15.5)
WBC: 20.2 K/uL — ABNORMAL HIGH (ref 4.0–10.5)

## 2016-12-10 LAB — BPAM RBC
Blood Product Expiration Date: 201809262359
Blood Product Expiration Date: 201809262359
ISSUE DATE / TIME: 201808251634
ISSUE DATE / TIME: 201808252007
Unit Type and Rh: 5100
Unit Type and Rh: 5100

## 2016-12-10 LAB — TROPONIN I
Troponin I: 0.03 ng/mL
Troponin I: 0.04 ng/mL

## 2016-12-10 MED ORDER — METOPROLOL TARTRATE 25 MG/10 ML ORAL SUSPENSION
25.0000 mg | Freq: Two times a day (BID) | ORAL | Status: DC
  Administered 2016-12-10 – 2016-12-21 (×22): 25 mg
  Filled 2016-12-10 (×22): qty 10

## 2016-12-10 MED ORDER — LABETALOL HCL 5 MG/ML IV SOLN
5.0000 mg | INTRAVENOUS | Status: DC | PRN
  Administered 2016-12-10: 5 mg via INTRAVENOUS
  Filled 2016-12-10 (×2): qty 4

## 2016-12-10 MED ORDER — ENOXAPARIN SODIUM 40 MG/0.4ML ~~LOC~~ SOLN
40.0000 mg | SUBCUTANEOUS | Status: DC
  Administered 2016-12-10 – 2016-12-21 (×12): 40 mg via SUBCUTANEOUS
  Filled 2016-12-10 (×12): qty 0.4

## 2016-12-10 MED ORDER — METOPROLOL TARTRATE 5 MG/5ML IV SOLN
5.0000 mg | INTRAVENOUS | Status: DC | PRN
  Administered 2016-12-10: 5 mg via INTRAVENOUS
  Filled 2016-12-10: qty 5

## 2016-12-10 MED ORDER — DEXTROSE 5 % IV SOLN
1.0000 g | Freq: Three times a day (TID) | INTRAVENOUS | Status: DC
  Filled 2016-12-10: qty 1

## 2016-12-10 MED ORDER — PIPERACILLIN-TAZOBACTAM 3.375 G IVPB
3.3750 g | Freq: Three times a day (TID) | INTRAVENOUS | Status: DC
  Administered 2016-12-10 – 2016-12-13 (×9): 3.375 g via INTRAVENOUS
  Filled 2016-12-10 (×10): qty 50

## 2016-12-10 MED ORDER — CLONIDINE HCL 0.1 MG PO TABS
0.1000 mg | ORAL_TABLET | Freq: Two times a day (BID) | ORAL | Status: DC
  Administered 2016-12-10 – 2016-12-21 (×22): 0.1 mg
  Filled 2016-12-10 (×22): qty 1

## 2016-12-10 MED ORDER — METOPROLOL TARTRATE 5 MG/5ML IV SOLN
5.0000 mg | Freq: Once | INTRAVENOUS | Status: AC
  Administered 2016-12-10: 5 mg via INTRAVENOUS
  Filled 2016-12-10: qty 5

## 2016-12-10 NOTE — Consult Note (Signed)
[facility] Nurse wound consult note Reason for Consult: Previously healed full thickness pressure injury to the sacrum, Stage 3 or 4. Bilateral heels are dry, peeling. Left knee is edematous. Wound type: Pressure Pressure Injury POA: Yes Measurement: Scarred area without return of pigmentation at distal sacrum measuring 3cm x 4cm x 0.0.  Complete reepithelialization achieved. Wound bed: As described above Drainage (amount, consistency, odor) None Periwound: Intact, dry. Dressing procedure/placement/frequency: I will provide Nursing with guidance for prophylactic sacral dressing of silicone foam and bilateral pressure redistribution heel boots. [place] nursing team will not follow, but will remain available to this patient, the nursing and medical teams.  Please re-consult if needed. Thanks, Maudie Flakes, MSN, RN, [place], Arther Abbott  Pager# [phone]

## 2016-12-10 NOTE — Plan of Care (Signed)
Problem: Pain Managment: Goal: General experience of comfort will improve Outcome: Progressing Pt. CPOT has remained 0 thus far in his hospital stay. Will continue to monitor.

## 2016-12-10 NOTE — Progress Notes (Signed)
Notified via central telemetry that pt. Had 7 beat run of vtach. Asymptomatic and HR now 105-115. Notified Dr. Myna Hidalgo. No new orders at this time. Will continue to monitor.
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
## 2016-12-10 NOTE — Assessment & Plan Note (Signed)
S/p attempted catheter decompression by emp 12/09/16

## 2016-12-10 NOTE — Progress Notes (Signed)
Pharmacy Antibiotic Note  Scott Rivera is a 66 y.o. male admitted on 12/09/2016 with aspiration PNA.  Pharmacy has been consulted for vancomycin and cefepime dosing.  Patient's renal function is improving.  He is now afebrile and his WBC is trending down.  CXR shows small left pneumothorax.   Plan: Continue vanc 1g IV Q24H Change Cefepime to 1gm IV Q8H Monitor renal fxn, clinical progress, vanc trough at Css F/U increase Lovenox if renal fxn improves further in AM, resume home meds, Na+   Height: 5\' 11"  (180.3 cm) Weight: 140 lb 6.9 oz (63.7 kg) IBW/kg (Calculated) : 75.3  Temp (24hrs), Avg:98.4 F (36.9 C), Min:97.3 F (36.3 C), Max:99.2 F (37.3 C)   Recent Labs Lab 12/09/16 0350 12/09/16 0405 12/09/16 0614 12/09/16 0638 12/09/16 1419 12/10/16 0323  WBC 27.6*  --   --   --   --  20.2*  CREATININE 2.47*  --  2.10*  --  1.88* 1.77*  LATICACIDVEN  --  2.94*  --  1.88  --   --     Estimated Creatinine Clearance: 37 mL/min (A) (by C-G formula based on SCr of 1.77 mg/dL (H)).    No Known Allergies   8/25 Cefepime >>  8/25 Vanc >>    8/25 MRSA: neg 8/25 BCx: NGTD 8/25 resp:  GPC, GPR   Scott Rivera D. Mina Marble, PharmD, BCPS Pager:  [phone] 12/10/2016, 12:43 PM

## 2016-12-10 NOTE — Progress Notes (Signed)
Troponin still elevated at 0.03. Relayed to attending MD

## 2016-12-10 NOTE — Progress Notes (Signed)
Pharmacy Antibiotic Note  Scott Rivera is a 66 y.o. male admitted on 12/09/2016 with aspiration PNA.  Pharmacy has been consulted for vancomycin and Zosyn dosing.  Patient's renal function is improving.  He is now afebrile and his WBC is trending down.  CXR shows small left pneumothorax.   Plan: Vancomycin 1g IV Q24H Zosyn 3.375g IV q8h EI (discontinue cefepime) Monitor renal fxn, clinical progress, renal function, vanc trough at Css   Height: 5\' 11"  (180.3 cm) Weight: 140 lb 6.9 oz (63.7 kg) IBW/kg (Calculated) : 75.3  Temp (24hrs), Avg:98.4 F (36.9 C), Min:97.3 F (36.3 C), Max:99.2 F (37.3 C)   Recent Labs Lab 12/09/16 0350 12/09/16 0405 12/09/16 0614 12/09/16 0638 12/09/16 1419 12/10/16 0323  WBC 27.6*  --   --   --   --  20.2*  CREATININE 2.47*  --  2.10*  --  1.88* 1.77*  LATICACIDVEN  --  2.94*  --  1.88  --   --     Estimated Creatinine Clearance: 37 mL/min (A) (by C-G formula based on SCr of 1.77 mg/dL (H)).    No Known Allergies  Cefepime 8/25>> 8/26 Vanc 8/25>>  Zosyn 8/26>>   8/25 MRSA: neg 8/25 BCx: NGTD 8/25 resp:  GPC, GPR > reincubated  Ajane Novella D. Vianey Caniglia, PharmD, BCPS Clinical Pharmacist Pager: [phone] Clinical Phone for 12/10/2016 until 3:30pm: [phone] If after 3:30pm, please call main pharmacy at [phone] 12/10/2016 2:43 PM

## 2016-12-10 NOTE — Progress Notes (Addendum)
PULMONARY / CRITICAL CARE MEDICINE   Name: Scott Rivera MRN: [id number] DOB: 08/13/1950    ADMISSION DATE:  12/09/2016 CONSULTATION DATE:  12/09/2016  REFERRING MD:  Dr. Evangeline Rivera  CHIEF COMPLAINT:  Evaluation of probable aspiration pneumonia and impending sepsis/  Traumatic Left PTX   HISTORY OF PRESENT ILLNESS:   [name] is a 66 year old male with history of hypertension, CVA, recurrent aspiration requiring tracheostomy and G-tube, who lives in a nursing home and presented to the emergency department after EMS was called out for the patient being found unresponsive. He was previously admitted 10/19/2016 with bi-hemispheric and cerebellar infarcts and sepsis secondary to aspiration pneumonia, with a requirement for mechanical ventilation. Due to recurrent aspirations he had a tracheostomy placed as well as a PEG for feedings. 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 He was discharged to a SNF. Reportedly he was found with a GCS of 3 by nursing home staff and completely unresponsive. During transport , EMS reported O2 sats in the 80s. Upon arrival he was tachycardic and febrile to 101.1 F but not hypotensive. His BP has was transiently labile, falling to as low as 94/53; he was treated for possible L PTX with catheter that was pulled and he has received 5500 cc thus far. With a leukocytosis of 27.6K and PCXR showing a RLL infiltrate, he has been empirically started on Vanc + Cefipime. Although he moves all 4 extremities with open eyes, he does not respond to verbal commands or stimuli.  PAST MEDICAL HISTORY :  He  has a past medical history of Anemia due to chronic kidney disease; Aspiration pneumonia ([place]); Chronic kidney disease, stage 3 (moderate); Hypertension; and Stroke [facility]).  PAST SURGICAL HISTORY: He  has a past surgical history that includes Knee surgery; IR GASTROSTOMY TUBE MOD SED (10/27/2016); and Tracheostomy tube placement.  No Known  Allergies  No current facility-administered medications on file prior to encounter.    Current Outpatient Prescriptions on File Prior to Encounter  Medication Sig  . Amino Acids-Protein Hydrolys (FEEDING SUPPLEMENT, PRO-STAT SUGAR FREE 64,) LIQD Place 30 mLs into feeding tube 3 (three) times daily. (Patient taking differently: Place 30 mLs into feeding tube daily. )  . amLODipine (NORVASC) 10 MG tablet Place 1 tablet (10 mg total) into feeding tube daily.  Marland Kitchen aspirin 325 MG tablet Place 1 tablet (325 mg total) into feeding tube daily.  . bethanechol (URECHOLINE) 10 MG tablet Place 1 tablet (10 mg total) into feeding tube 3 (three) times daily.  . cloNIDine (CATAPRES) 0.1 MG tablet Place 1 tablet (0.1 mg total) into feeding tube 2 (two) times daily.  . clopidogrel (PLAVIX) 75 MG tablet Place 1 tablet (75 mg total) into feeding tube daily.  . famotidine (PEPCID) 40 MG/5ML suspension Place 2.5 mLs (20 mg total) into feeding tube 2 (two) times daily.  . metoCLOPramide (REGLAN) 5 MG/5ML solution Place 10 mLs (10 mg total) into feeding tube 4 (four) times daily -  before meals and at bedtime.  . metoprolol tartrate (LOPRESSOR) 25 mg/10 mL SUSP Place 10 mLs (25 mg total) into feeding tube 2 (two) times daily.  . Multiple Vitamin (MULTIVITAMIN WITH MINERALS) TABS tablet Place 1 tablet into feeding tube daily.  . nicotine (NICODERM CQ - DOSED IN MG/24 HOURS) 14 mg/24hr patch Place 1 patch (14 mg total) onto the skin daily.  . Nutritional Supplements (FEEDING SUPPLEMENT, JEVITY 1.5 CAL/FIBER,) LIQD Place 1,000 mLs into feeding tube daily. 35ml/hr (Patient taking  differently: Place 1,000 mLs into feeding tube daily. 44ml/hr)  . pravastatin (PRAVACHOL) 20 MG tablet Place 1 tablet (20 mg total) into feeding tube daily at 6 PM.  . thiamine 100 MG tablet Place 1 tablet (100 mg total) into feeding tube daily.  . traMADol (ULTRAM) 50 MG tablet Take 1 tablet (50 mg total) by mouth 2 (two) times daily as needed for  severe pain.  . Water For Irrigation, Sterile (FREE WATER) SOLN Place 300 mLs into feeding tube every 4 (four) hours. (Patient taking differently: Place 300 mLs into feeding tube 4 (four) times daily. )  . Nutritional Supplements (FEEDING SUPPLEMENT, JEVITY 1.2 CAL,) LIQD Place 1,000 mLs into feeding tube continuous. (Patient not taking: Reported on 12/09/2016)    FAMILY HISTORY:  His indicated that the status of his brother is unknown. He indicated that the status of his neg hx is unknown.    SOCIAL HISTORY: He  reports that he has quit smoking. He has never used smokeless tobacco. He reports that he drinks alcohol.  REVIEW OF SYSTEMS:   Unable to prove a hx; per previous H&P  SUBJECTIVE:  Appears comfortable on T collar , not really communicating with staff though makes eye contact   VITAL SIGNS: BP (!) 146/64   Pulse (!) 124   Temp 97.7 F (36.5 C) (Oral)   Resp 20   Ht 5\' 11"  (1.803 m)   Wt 140 lb 6.9 oz (63.7 kg)   SpO2 99%   BMI 19.59 kg/m   HEMODYNAMICS:    VENTILATOR SETTINGS: FiO2 (%):  [35 %-98 %] 98 %  INTAKE / OUTPUT: I/O last 3 completed shifts: In: 7941.3 [I.V.:4086.3; Blood:125; Other:180; IV Piggyback:3550] Out: 2000 [Urine:2000]    PHYSICAL EXAMINATION: General:  Thin black male in no acute respiratory distress Neuro: Moves all 4s spont, not consistently f/c HEENT:  [place]/AT; PRRL, arcus senilus; poor dentition; neck supple with trach in place Cardiovascular:  Tachycardic but regular; No murmurs Lungs:  Bibasilar crackles with diminished BS Abdomen:  PEG in situ; no organomegaly, guarding Musculoskeletal:  Surgical scars both knees Skin:  No edema    LABS:  BMET  Recent Labs Lab 12/09/16 0350 12/09/16 0614 12/09/16 1419 12/10/16 0323  NA 154* 158* 157* 157*  K 6.2* 4.4 4.4 4.1  CL 115* 125* 124* 124*  CO2 26  --  23 23  BUN 66* 55* 48* 38*  CREATININE 2.47* 2.10* 1.88* 1.77*  GLUCOSE 152* 119* 88 67    Electrolytes  Recent  Labs Lab 12/09/16 0350 12/09/16 1419 12/10/16 0323  CALCIUM 11.4* 10.5* 11.0*    CBC  Recent Labs Lab 12/09/16 0350 12/09/16 0614 12/10/16 0323  WBC 27.6*  --  20.2*  HGB 9.2* 7.8* 9.9*  HCT 30.7* 23.0* 32.8*  PLT 836*  --  651*    Coag's No results for input(s): APTT, INR in the last 168 hours.  Sepsis Markers  Recent Labs Lab 12/09/16 0405 12/09/16 0638 12/09/16 1419  LATICACIDVEN 2.94* 1.88  --   PROCALCITON  --   --  1.23    ABG  Recent Labs Lab 12/09/16 0430 12/09/16 1215 12/10/16 0620  PHART 7.528* 7.411 7.423  PCO2ART 31.7* 38.4 35.4  PO2ART 73.0* 64.2* 75.5*    Liver Enzymes  Recent Labs Lab 12/09/16 0350 12/10/16 0323  AST 62* 30  ALT 75* 55  ALKPHOS 230* 192*  BILITOT 1.1 0.6  ALBUMIN 2.2* 2.0*    Cardiac Enzymes  Recent Labs Lab 12/09/16 0350  12/09/16 1419 12/10/16 0803  TROPONINI 0.03* 0.17* 0.03*    Glucose  Recent Labs Lab 12/09/16 0426 12/09/16 1118 12/10/16 0735  GLUCAP 144* 93 82    Imaging Portable Chest 1 View  Result Date: 12/10/2016 CLINICAL DATA:  Sepsis. EXAM: PORTABLE CHEST 1 VIEW COMPARISON:  12/09/2016 FINDINGS: 0631 hours. There is a new small left-sided pneumothorax. Persistent bibasilar airspace disease, similar to prior. The cardiopericardial silhouette is within normal limits for size. Tracheostomy tube again noted. Telemetry leads overlie the chest. IMPRESSION: New small left pneumothorax. Critical Value/emergent results were called by telephone at the time of interpretation on 12/10/2016 at 8:22 am to Dr. Bonner Puna, who verbally acknowledged these results. Electronically Signed   By: Misty Stanley M.D.   On: 12/10/2016 08:24    I personally reviewed images and agree with radiology impression as follows:  pCXR:   8/26: Very small L ptx / no sub q air or tension effects  STUDIES:  ECG: No acute ST-T changes    CULTURES: MRSA screen  8/25 > neg   trach asp 8/25 >  abudant pmns,  Min mixed orgs  >>> BC x 2  8/25 >>>  ANTIBIOTICS: Vanc + Cefipime  8/25 >>>       ASSESSMENT / PLAN:  PULMONARY A: 1) probable recurrent asp pna  = HCAP > vanc/max approp pending final cultures  2) Traumatic L PTX, no resp distress  >  rec increase 02 to 100% to create a nitrogen washout /gradient to absorb the air in the L chest s chest tube and so that further air leaking in the lung is all 02 which is easier to absorb given that the pleural venous p02 is around 40 mm  regardless of the arterial p02     CARDIOVASCULAR A:  Hemodynamically improved  after fluid resuscitation. No evidence of any acute ischemic events. P:  Monitor  RENAL A:   Pre-renal azotemia likely secondary to dehydration with hypernatremia P:   rx per Primary service= triad    GASTROINTESTINAL A:   Does not appear to be an active problem although transaminases are mildly increased. P:   rx with TF per triad    HEMATOLOGIC   Lab Results  Component Value Date   HGB 9.9 (L) 12/10/2016   HGB 7.8 (L) 12/09/2016   HGB 9.2 (L) 12/09/2016    A:   [place] anemia and chronic P:  No acute need for tx  INFECTIOUS A:   Recurrent aspiration pneumonia. N.B. WBC was 21.1 x 11 days pta  P:   Abx regimen is adequate. Await cultures  ENDOCRINE A:   Hyperglycemia; no prior hx  P:   Follow acuuchecks  NEUROLOGIC A:   S/p bi-hemispheric and cerebellar CVA with altered MS P:  This is really the limiting issue here and it may well be we reached a point where medical science has done all it can     Pulmonary and [place] Pager: [phone]  12/10/2016, 10:52 AM

## 2016-12-10 NOTE — Progress Notes (Signed)
Troponin still elevated at 0.04. Relayed to attending MD

## 2016-12-10 NOTE — Progress Notes (Addendum)
PROGRESS NOTE  KWELI GRASSEL  HDQ:[id number] DOB: 1951-03-09 DOA: 12/09/2016 PCP: Patient, No Pcp Per   Brief Narrative: ILARIO DHALIWAL is a 66 y.o. 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 On EMS arrival he was reported to have tube feeding from tracheostomy site, and had no left-sided breath sounds, so needle decompression was performed en route. On arrival to ED he was febrile to 101F, tachycardic to 152bpm, BP 119/55, RR 38/min. He was not meaningfully responsive. CXR demonstrated increasing right, and unchanged left, lung base opacities, without pneumothorax. WBC 27.6, lactic acid 2.94. Hypernatremia was noted with Na 154, up to 158 with isotonic saline.   He was admitted for sepsis due to aspiration pneumonia. IV fluids and vancomycin/cefepime were given for sepsis. Lactic acid normalized. Hgb dropped 9.2 > 7.8 with IV fluids, so 2u PRBCs given with improvement to 9.9. Troponin was mildly elevated but with downward trend 0.17 > 0.03. Cr has improved from 2.47 on admission.   Recent history per HPI:  He was initially admitted to [facility] on July 1 for massive CVA, transferred to our facility on July 5, and eventually discharged to [facility] on August 18. During that hospitalization he had difficulties with kidney injury, hypernatremia, and severe leukocytosis. The patient's sister states that he has been improving while at the nursing facility. His baseline was unresponsive but he can lift up his legs and occasionally points with his fingers and moves his mouth in an attempt to communicate. He remains nonverbal with a trach collar and PEG tube. He's continued to have tube feeds as he has not been able to have his diet advanced yet with speech therapy.  Assessment & Plan: Principal  Problem:   Severe sepsis with acute organ dysfunction [facility]) Active Problems:   Essential hypertension   Dysphagia, post-stroke   Tobacco abuse   Tachypnea   Hyperglycemia   Hypernatremia   Leukocytosis (leucocytosis)   Aspiration pneumonia (HCC)   Goals of care, counseling/discussion   Hypotension   Acute-on-chronic kidney injury ([place])   Elevated troponin   Anemia due to chronic kidney disease   Compensated metabolic alkalosis   Pneumothorax, closed, traumatic, initial encounter  Severe sepsis due to multilobar aspiration pneumonia/pneumonitis: Recurrent aspirations since CVA in July 2018. Lactate elevate resolved.  - Improving hemodynamics with IV fluids and antibiotics. Still requires SDU level of care.  - Continue vancomycin, switch to zosyn for anaerobic coverage pending blood cultures.  - Continue trach care/suctioning - Appreciate PCCM consultation - Continue NPO, D5 1/2NS at maintenance rate. Can restart TF's once more stable.   Acute on chronic hypoxic respiratory failure: Trach-dependent.  - Increase SpO2  Acute renal failure on stage III CKD: Cr at recent discharge was 1.34, presumed baseline. SCr on arrival 2.47, improving with IVF's consistent with prerenal azotemia due to dehydration (with hypernatremia) and sepsis.  - Continue IVF's, transition to tube feeds as able.  - Continue to monitor BMP, UOP.  - Monitor vanc level closely.   Elevated troponin: Due to demand ischemia in setting of aspiration event/sepsis. Downward trend, no acute changes on ECG.  - No further intervention planned at this time  Traumatic pneumothorax: On CXR 8/26. No mediastinal shift.  - Pulmonology recommends increasing FiO2 for nitrogen washout.   Cerebrovascular disease s/p CVAs: Neuroimaging from early July demonstrate  acute infarction in the left more than right cerebellum, right midbrain, left thalamus, and right occipital lobe. Extensive remote infarction in the perisylvian left  MCA territory and distal left ACA territories. - Continue ASA, plavix - Will consult SLP if mental status improves.   Severe protein-calorie malnutrition: Due to acute/chronic illness.  - Nutrition consulted. Restart tube feeds soon.  Hypernatremia: Suspect free water deficit due to tube feeds. May require increase in free water per tube once improved.  - Hypotonic saline - Monitor BMP q12h.  Essential HTN: BPs creeping back up.  - Restart clonidine and metoprolol. Hold norvasc.   Anemia due to chronic kidney disease:  - With acute drop more likely hemodilution. Discharge hgb noted to be 7.3, with persistent values in 7 range. Transfused 2u PRBCs with improvement to 9.9.  - Continue to monitor  Tobacco use:  - Nicotine patch.   Neutrophilic leukocytosis: Pt noted to have persistent leukocytosis during previous admission, planned hematology follow up. On review of Care Everywhere, WBC has been 9.4 - 10.2 dating back to 2012. The current leukocytosis is most likely related to infection, probable ongoing microaspiration, and stress demargination, much less likely is primary bone marrow abnormality. Thrombocytosis also noted, thought to be reactive.  - Will monitor with treatment. Can consider hematology consultation vs. referral pending clinical course.   Goals of care: The medical team suspects continued aspiration even if he recovers from this particular episode. This has been addressed repeatedly during previous hospitalization and at admission by admitting MD. Palliative care has been consulted, though family has confirmed their desire to pursue all aggressive medical interventions.  - Appreciate palliative care continuing to follow this patient.  - Will continue ongoing discussions, particularly if no further meaningful improvement is actualized.   DVT prophylaxis: Lovenox Code Status: Full, confirmed  Family Communication: Sister by palliative care after family meeting.   Disposition Plan: Continue SDU care.   Consultants:   Pulmonology/ critical care medicine  Palliative care team  Procedures:   Left pneumothorax needle decompression 8/25  Antimicrobials:  Vancomycin 8/25 >>  Cefepime 8/25  Zosyn 8/26 >>   Subjective: Pt moving arms and legs intermittently without purpose, opens eyes, nonverbal. No family at bedside this AM  Objective: Vitals:   12/10/16 0800 12/10/16 0900 12/10/16 1112 12/10/16 1130  BP: 125/70 (!) 146/64 139/69 139/69  Pulse: (!) 116 (!) 124 (!) 105 99  Resp: (!) 22 20 (!) 35 (!) 22  Temp:      TempSrc:      SpO2: 98% 99%  100%  Weight:      Height:        Intake/Output Summary (Last 24 hours) at 12/10/16 1255 Last data filed at 12/10/16 0900  Gross per 24 hour  Intake          1349.66 ml  Output             2450 ml  Net         -1100.34 ml   Filed Weights   12/09/16 0347 12/09/16 1115 12/10/16 0406  Weight: 71.2 kg (157 lb) 63.5 kg (139 lb 15.9 oz) 63.7 kg (140 lb 6.9 oz)    Gen: Ill-appearing, thin 66yo male in no acute distress Neck: Supple, trach in place, no discharge.  Pulm: Non-labored breathing with trach collar. Diffusely rhonchorous breath sounds, breath sounds symmetric.  CV: Regular tachycardia, narrow complex on monitor. No murmur or JVD or edema. GI: Abdomen soft, no reaction to palpation. PEG in  place c/d/i. + BS.  Ext: Warm, no deformities, mittens on bilaterally.  Skin: As above and no ulcerations noted. Neuro: Moves all extremities, opens eyes, turns head to voice, eventually makes eye contact.  Psych: UTD  Data Reviewed: I have personally reviewed following labs and imaging studies  CBC:  Recent Labs Lab 12/09/16 0350 12/09/16 0614 12/10/16 0323  WBC 27.6*  --  20.2*  NEUTROABS 22.3*  --   --   HGB 9.2* 7.8* 9.9*  HCT 30.7* 23.0* 32.8*  MCV 94.5  --  93.4  PLT 836*  --  017*   Basic Metabolic Panel:  Recent Labs Lab 12/09/16 0350 12/09/16 0614 12/09/16 1419  12/10/16 0323  NA 154* 158* 157* 157*  K 6.2* 4.4 4.4 4.1  CL 115* 125* 124* 124*  CO2 26  --  23 23  GLUCOSE 152* 119* 88 67  BUN 66* 55* 48* 38*  CREATININE 2.47* 2.10* 1.88* 1.77*  CALCIUM 11.4*  --  10.5* 11.0*   GFR: Estimated Creatinine Clearance: 37 mL/min (A) (by C-G formula based on SCr of 1.77 mg/dL (H)). Liver Function Tests:  Recent Labs Lab 12/09/16 0350 12/10/16 0323  AST 62* 30  ALT 75* 55  ALKPHOS 230* 192*  BILITOT 1.1 0.6  PROT 9.5* 8.4*  ALBUMIN 2.2* 2.0*   No results for input(s): LIPASE, AMYLASE in the last 168 hours. No results for input(s): AMMONIA in the last 168 hours. Coagulation Profile: No results for input(s): INR, PROTIME in the last 168 hours. Cardiac Enzymes:  Recent Labs Lab 12/09/16 0350 12/09/16 1419 12/10/16 0803  TROPONINI 0.03* 0.17* 0.03*   BNP (last 3 results) No results for input(s): PROBNP in the last 8760 hours. HbA1C:  Recent Labs  12/09/16 1419  HGBA1C 5.7*   CBG:  Recent Labs Lab 12/09/16 0426 12/09/16 1118 12/10/16 0735  GLUCAP 144* 93 82   Lipid Profile: No results for input(s): CHOL, HDL, LDLCALC, TRIG, CHOLHDL, LDLDIRECT in the last 72 hours. Thyroid Function Tests: No results for input(s): TSH, T4TOTAL, FREET4, T3FREE, THYROIDAB in the last 72 hours. Anemia Panel: No results for input(s): VITAMINB12, FOLATE, FERRITIN, TIBC, IRON, RETICCTPCT in the last 72 hours. Urine analysis:    Component Value Date/Time   COLORURINE YELLOW 12/09/2016 0416   APPEARANCEUR HAZY (A) 12/09/2016 0416   LABSPEC 1.016 12/09/2016 0416   PHURINE 6.0 12/09/2016 0416   GLUCOSEU NEGATIVE 12/09/2016 0416   HGBUR NEGATIVE 12/09/2016 0416   BILIRUBINUR NEGATIVE 12/09/2016 0416   KETONESUR NEGATIVE 12/09/2016 0416   PROTEINUR 30 (A) 12/09/2016 0416   NITRITE NEGATIVE 12/09/2016 0416   LEUKOCYTESUR NEGATIVE 12/09/2016 0416   Recent Results (from the past 240 hour(s))  Blood Culture (routine x 2)     Status: None  (Preliminary result)   Collection Time: 12/09/16  3:52 AM  Result Value Ref Range Status   Specimen Description BLOOD RIGHT FOREARM  Final   Special Requests IN PEDIATRIC BOTTLE Blood Culture adequate volume  Final   Culture NO GROWTH 1 DAY  Final   Report Status PENDING  Incomplete  Blood Culture (routine x 2)     Status: None (Preliminary result)   Collection Time: 12/09/16  4:00 AM  Result Value Ref Range Status   Specimen Description BLOOD LEFT HAND  Final   Special Requests IN PEDIATRIC BOTTLE Blood Culture adequate volume  Final   Culture NO GROWTH 1 DAY  Final   Report Status PENDING  Incomplete  MRSA PCR Screening  Status: None   Collection Time: 12/09/16 12:00 PM  Result Value Ref Range Status   MRSA by PCR NEGATIVE NEGATIVE Final    Comment:        The GeneXpert MRSA Assay (FDA approved for NASAL specimens only), is one component of a comprehensive MRSA colonization surveillance program. It is not intended to diagnose MRSA infection nor to guide or monitor treatment for MRSA infections.   Culture, respiratory (NON-Expectorated)     Status: None (Preliminary result)   Collection Time: 12/09/16 12:30 PM  Result Value Ref Range Status   Specimen Description TRACHEAL ASPIRATE  Final   Special Requests NONE  Final   Gram Stain   Final    ABUNDANT WBC PRESENT, PREDOMINANTLY PMN FEW SQUAMOUS EPITHELIAL CELLS PRESENT FEW GRAM POSITIVE RODS RARE SQUAMOUS EPITHELIAL CELLS PRESENT RARE GRAM POSITIVE COCCI IN PAIRS    Culture CULTURE REINCUBATED FOR BETTER GROWTH  Final   Report Status PENDING  Incomplete      Radiology Studies: Portable Chest 1 View  Result Date: 12/10/2016 CLINICAL DATA:  Sepsis. EXAM: PORTABLE CHEST 1 VIEW COMPARISON:  12/09/2016 FINDINGS: 0631 hours. There is a new small left-sided pneumothorax. Persistent bibasilar airspace disease, similar to prior. The cardiopericardial silhouette is within normal limits for size. Tracheostomy tube again noted.  Telemetry leads overlie the chest. IMPRESSION: New small left pneumothorax. Critical Value/emergent results were called by telephone at the time of interpretation on 12/10/2016 at 8:22 am to Dr. Bonner Puna, who verbally acknowledged these results. Electronically Signed   By: Misty Stanley M.D.   On: 12/10/2016 08:24   Dg Chest Port 1 View  Result Date: 12/09/2016 CLINICAL DATA:  Pneumonia. EXAM: PORTABLE CHEST 1 VIEW COMPARISON:  Radiograph 11/20/2016 FINDINGS: Tracheostomy tube at the thoracic inlet. Worsening patchy right lung base opacity. Unchanged left basilar opacity. Upper lungs are clear, the patient's chin obscures the right lung apex. Unchanged heart size and mediastinal contours. No large pleural effusion. No pneumothorax. Tubular catheter like density projecting over the left upper chest is presumably external to the patient. IMPRESSION: Increasing right lung base opacity. This may be pneumonia, aspiration or atelectasis. Left basilar opacity is unchanged. Electronically Signed   By: Jeb Levering M.D.   On: 12/09/2016 04:24    Scheduled Meds: . aspirin  325 mg Per Tube Daily  . chlorhexidine gluconate (MEDLINE KIT)  15 mL Mouth Rinse BID  . clopidogrel  75 mg Per Tube Daily  . enoxaparin (LOVENOX) injection  40 mg Subcutaneous Q24H  . famotidine  20 mg Per Tube BID  . mouth rinse  15 mL Mouth Rinse QID  . metoCLOPramide  10 mg Per Tube TID AC & HS  . pravastatin  20 mg Per Tube q1800  . senna  1 tablet Per Tube BID  . sodium chloride flush  3 mL Intravenous Q12H   Continuous Infusions: . ceFEPime (MAXIPIME) IV    . dextrose 5 % and 0.45% NaCl    . vancomycin Stopped (12/10/16 0707)     LOS: 1 day   Time spent: 35 minutes.  Vance Gather, MD [facility] Pager [phone]  If 7PM-7AM, please contact night-coverage [email or web address] Password [facility] 12/10/2016, 12:55 PM

## 2016-12-11 ENCOUNTER — Inpatient Hospital Stay (HOSPITAL_COMMUNITY): Payer: Medicare HMO

## 2016-12-11 DIAGNOSIS — J690 Pneumonitis due to inhalation of food and vomit: Secondary | ICD-10-CM

## 2016-12-11 DIAGNOSIS — S270XXS Traumatic pneumothorax, sequela: Secondary | ICD-10-CM

## 2016-12-11 DIAGNOSIS — Z930 Tracheostomy status: Secondary | ICD-10-CM

## 2016-12-11 DIAGNOSIS — J939 Pneumothorax, unspecified: Secondary | ICD-10-CM

## 2016-12-11 LAB — BASIC METABOLIC PANEL
Anion gap: 9 (ref 5–15)
BUN: 28 mg/dL — ABNORMAL HIGH (ref 6–20)
CO2: 21 mmol/L — ABNORMAL LOW (ref 22–32)
Calcium: 10.5 mg/dL — ABNORMAL HIGH (ref 8.9–10.3)
Chloride: 121 mmol/L — ABNORMAL HIGH (ref 101–111)
Creatinine, Ser: 1.82 mg/dL — ABNORMAL HIGH (ref 0.61–1.24)
GFR calc Af Amer: 43 mL/min — ABNORMAL LOW (ref >60)
GFR calc non Af Amer: 37 mL/min — ABNORMAL LOW (ref >60)
Glucose, Bld: 98 mg/dL (ref 65–99)
Potassium: 3.9 mmol/L (ref 3.5–5.1)
Sodium: 151 mmol/L — ABNORMAL HIGH (ref 135–145)

## 2016-12-11 LAB — CBC
HCT: 25.3 % — ABNORMAL LOW (ref 39.0–52.0)
Hemoglobin: 7.8 g/dL — ABNORMAL LOW (ref 13.0–17.0)
MCH: 28.7 pg (ref 26.0–34.0)
MCHC: 30.8 g/dL (ref 30.0–36.0)
MCV: 93 fL (ref 78.0–100.0)
Platelets: 540 10*3/uL — ABNORMAL HIGH (ref 150–400)
RBC: 2.72 MIL/uL — ABNORMAL LOW (ref 4.22–5.81)
RDW: 17.6 % — ABNORMAL HIGH (ref 11.5–15.5)
WBC: 16.1 10*3/uL — ABNORMAL HIGH (ref 4.0–10.5)

## 2016-12-11 LAB — GLUCOSE, CAPILLARY
Glucose-Capillary: 93 mg/dL (ref 65–99)
Glucose-Capillary: 106 mg/dL — ABNORMAL HIGH (ref 65–99)
Glucose-Capillary: 115 mg/dL — ABNORMAL HIGH (ref 65–99)
Glucose-Capillary: 114 mg/dL — ABNORMAL HIGH (ref 65–99)
Glucose-Capillary: 99 mg/dL (ref 65–99)

## 2016-12-11 LAB — BLOOD GAS, ARTERIAL
Acid-base deficit: 1.5 mmol/L (ref 0.0–2.0)
Bicarbonate: 22 mmol/L (ref 20.0–28.0)
Drawn by: 414221
FIO2: 95
O2 Saturation: 98.3 %
Patient temperature: 98.6
pCO2 arterial: 32.4 mmHg (ref 32.0–48.0)
pH, Arterial: 7.447 (ref 7.350–7.450)
pO2, Arterial: 107 mmHg (ref 83.0–108.0)

## 2016-12-11 LAB — CORTISOL-AM, BLOOD: Cortisol - AM: 14.9 ug/dL (ref 6.7–22.6)

## 2016-12-11 MED ORDER — JEVITY 1.5 CAL/FIBER PO LIQD
1000.0000 mL | ORAL | Status: DC
  Administered 2016-12-11: 1000 mL
  Filled 2016-12-11: qty 1000

## 2016-12-11 MED ORDER — VANCOMYCIN HCL IN DEXTROSE 1-5 GM/200ML-% IV SOLN
1000.0000 mg | INTRAVENOUS | Status: DC
  Administered 2016-12-12: 1000 mg via INTRAVENOUS
  Filled 2016-12-11: qty 200

## 2016-12-11 MED ORDER — FREE WATER
200.0000 mL | Freq: Three times a day (TID) | Status: DC
  Administered 2016-12-11 (×2): 200 mL

## 2016-12-11 MED ORDER — PRO-STAT SUGAR FREE PO LIQD
30.0000 mL | Freq: Two times a day (BID) | ORAL | Status: DC
Start: 2016-12-11 — End: 2016-12-21
  Administered 2016-12-11 – 2016-12-21 (×21): 30 mL via ORAL
  Filled 2016-12-11 (×21): qty 30
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
## 2016-12-11 NOTE — Progress Notes (Signed)
PROGRESS NOTE  [name]  LFY:[id number] DOB: 25-Nov-1950 DOA: 12/09/2016 PCP: Patient, No Pcp Per   Brief Narrative: Scott Rivera is a 66 y.o. 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 On EMS arrival he was reported to have tube feeding from tracheostomy site, and had no left-sided breath sounds, so needle decompression was performed en route. On arrival to ED he was febrile to 101F, tachycardic to 152bpm, BP 119/55, RR 38/min. He was not meaningfully responsive. CXR demonstrated increasing right, and unchanged left, lung base opacities, without pneumothorax. WBC 27.6, lactic acid 2.94. Hypernatremia was noted with Na 154, up to 158 with isotonic saline.   He was admitted for sepsis due to aspiration pneumonia. IV fluids and vancomycin/cefepime were given for sepsis. Lactic acid normalized. Hgb dropped 9.2 > 7.8 with IV fluids, so 2u PRBCs given with improvement to 9.9. Troponin was mildly elevated but with downward trend 0.17 > 0.03. Cr has improved from 2.47 on admission.   Recent history per HPI:  He was initially admitted to [facility] on July 1 for massive CVA, transferred to our facility on July 5, and eventually discharged to [facility] on August 18. During that hospitalization he had difficulties with kidney injury, hypernatremia, and severe leukocytosis. The patient's sister states that he has been improving while at the nursing facility. His baseline was unresponsive but he can lift up his legs and occasionally points with his fingers and moves his mouth in an attempt to communicate. He remains nonverbal with a trach collar and PEG tube. He's continued to have tube feeds as he has not been able to have his diet advanced yet with speech therapy.  Assessment & Plan: Principal  Problem:   Severe sepsis with acute organ dysfunction [facility]) Active Problems:   Essential hypertension   Dysphagia, post-stroke   Tobacco abuse   Tachypnea   Hyperglycemia   Hypernatremia   Leukocytosis (leucocytosis)   Aspiration pneumonia (HCC)   Goals of care, counseling/discussion   Hypotension   Acute-on-chronic kidney injury ([place])   Elevated troponin   Anemia due to chronic kidney disease   Compensated metabolic alkalosis   Pneumothorax, closed, traumatic, initial encounter  Severe sepsis due to multilobar aspiration pneumonia/pneumonitis: Recurrent aspirations since CVA in July 2018. Lactate elevate resolved.  - Improving hemodynamics with IV fluids and antibiotics. Still requires SDU level of care.  - Continue zosyn for anaerobic coverage pending blood cultures. Respiratory culture growing mod S. aureus, will continue vancomycin for now.   - Continue trach care/suctioning - Appreciate PCCM consultation - Restart tube feeds, and wean off IVF's today.   Acute on chronic hypoxic respiratory failure: Trach-dependent.  - Increase FiO2 as below, per pulm.  Acute renal failure on stage III CKD: Cr at recent discharge was 1.34, presumed baseline. SCr on arrival 2.47, improving with IVF's consistent with prerenal azotemia due to dehydration (with hypernatremia) and sepsis.  - Continue IVF's, transition to tube feeds as able.  - Continue to monitor BMP, UOP. SCr plateuing at ~1.8. May be true baseline.  Elevated troponin: Due to demand ischemia in setting of aspiration event/sepsis. Downward trend, no acute changes on ECG.  - No further intervention planned at this time  Traumatic pneumothorax: On CXR 8/26. No mediastinal shift.  - Pulmonology recommends increasing FiO2 for nitrogen washout.  Cerebrovascular disease s/p CVAs: Neuroimaging from early July demonstrate acute infarction in the left more than right cerebellum, right midbrain, left thalamus, and right occipital  lobe. Extensive remote infarction in the perisylvian left MCA territory and distal left ACA territories. - Continue ASA, plavix - Will consult SLP if mental status improves.   Severe protein-calorie malnutrition: Due to acute/chronic illness.  - Nutrition consulted. Restart tube feeds today:  Jevity 1.5 at 50 ml/h (titrate up by 10cc/hr every 2 hours)  Pro-stat 30 ml BID  200 ml free water q4h  DC IVF's  Hypernatremia: Suspect free water deficit due to tube feeds. May require increase in free water per tube once improved.  - Hypotonic saline given with gradual improvement. Will continue free water per tube. - Monitor BMP   Essential HTN: BPs creeping back up.  - Restart clonidine and metoprolol. Hold norvasc.  - Labetalol IV prn severe HTN.   Anemia due to chronic kidney disease:  - With acute drop more likely hemodilution. Discharge hgb noted to be 7.3, which is actually around his baseline with persistent values in 7 range. Transfused 2u PRBCs with improvement to 9.9, now back to 7.8. Will monitor closely.  - Continue to monitor  Tobacco use:  - Nicotine patch if needed.  Neutrophilic leukocytosis: Pt noted to have persistent leukocytosis during previous admission, planned hematology follow up. On review of Care Everywhere, WBC has been 9.4 - 10.2 dating back to 2012. The current leukocytosis is most likely related to infection, probable ongoing microaspiration, and stress demargination, much less likely is primary bone marrow abnormality. Thrombocytosis also noted, thought to be reactive.  - Will monitor with treatment. Can consider hematology consultation vs. referral pending clinical course. Improving.  Goals of care: The medical team suspects continued aspiration even if he recovers from this particular episode. This has been addressed repeatedly during previous hospitalization and at admission by admitting MD. Palliative care has been consulted, though family has  confirmed their desire to pursue all aggressive medical interventions.  - Appreciate palliative care continuing to follow this patient.  - Will continue ongoing discussions, particularly if no further meaningful improvement is actualized.   DVT prophylaxis: Lovenox Code Status: Full, confirmed  Family Communication: Sister at bedside  Disposition Plan: Continue SDU care.   Consultants:   Pulmonology/ critical care medicine  Palliative care team  Procedures:   Left pneumothorax needle decompression 8/25  Antimicrobials:  Vancomycin 8/25 >>  Cefepime 8/25  Zosyn 8/26 >>   Subjective: Pt not meaningfully responsive. Sister at bedside states he is not as responsive as he was prior to admission. Has cough with suctioning per RN.   Objective: Vitals:   12/11/16 0335 12/11/16 0500 12/11/16 0749 12/11/16 0757  BP:   124/71 124/71  Pulse: 90  93 (!) 102  Resp: (!) 29  (!) 31 (!) 24  Temp:   99.5 F (37.5 C)   TempSrc:   Axillary   SpO2: 95%  100% 100%  Weight:  65.7 kg (144 lb 13.5 oz)    Height:        Intake/Output Summary (Last 24 hours) at 12/11/16 1121 Last data filed at 12/11/16 1000  Gross per 24 hour  Intake          2062.33 ml  Output             1350 ml  Net           712.33 ml   Filed Weights   12/09/16  1115 12/10/16 0406 12/11/16 0500  Weight: 63.5 kg (139 lb 15.9 oz) 63.7 kg (140 lb 6.9 oz) 65.7 kg (144 lb 13.5 oz)    Gen: Ill-appearing, thin 66yo male in no acute distress Neck: Supple, trach in place, no discharge, not significant secretions.  Pulm: Non-labored breathing with trach collar. SpO2 100%. Diffusely rhonchorous breath sounds, breath sounds symmetric.  CV: Regular borderline tachycardia, narrow complex. No murmur or JVD or edema. GI: Abdomen soft, no reaction to palpation. PEG in place c/d/i. + BS.  Ext: Warm, no deformities, mittens on bilaterally.  Skin: As above and no ulcerations noted. Neuro: Resting with eyes closed, will open eyes  to voice, moving all extremities.  Psych: UTD  Data Reviewed: I have personally reviewed following labs and imaging studies  CBC:  Recent Labs Lab 12/09/16 0350 12/09/16 0614 12/10/16 0323 12/11/16 0359  WBC 27.6*  --  20.2* 16.1*  NEUTROABS 22.3*  --   --   --   HGB 9.2* 7.8* 9.9* 7.8*  HCT 30.7* 23.0* 32.8* 25.3*  MCV 94.5  --  93.4 93.0  PLT 836*  --  651* 371*   Basic Metabolic Panel:  Recent Labs Lab 12/09/16 0350 12/09/16 0614 12/09/16 1419 12/10/16 0323 12/10/16 1544 12/11/16 0359  NA 154* 158* 157* 157* 153* 151*  K 6.2* 4.4 4.4 4.1 4.3 3.9  CL 115* 125* 124* 124* 122* 121*  CO2 26  --  23 23 20* 21*  GLUCOSE 152* 119* 88 67 55* 98  BUN 66* 55* 48* 38* 34* 28*  CREATININE 2.47* 2.10* 1.88* 1.77* 1.80* 1.82*  CALCIUM 11.4*  --  10.5* 11.0* 11.1* 10.5*   GFR: Estimated Creatinine Clearance: 37.1 mL/min (A) (by C-G formula based on SCr of 1.82 mg/dL (H)). Liver Function Tests:  Recent Labs Lab 12/09/16 0350 12/10/16 0323  AST 62* 30  ALT 75* 55  ALKPHOS 230* 192*  BILITOT 1.1 0.6  PROT 9.5* 8.4*  ALBUMIN 2.2* 2.0*   No results for input(s): LIPASE, AMYLASE in the last 168 hours. No results for input(s): AMMONIA in the last 168 hours. Coagulation Profile: No results for input(s): INR, PROTIME in the last 168 hours. Cardiac Enzymes:  Recent Labs Lab 12/09/16 0350 12/09/16 1419 12/10/16 0803 12/10/16 1315  TROPONINI 0.03* 0.17* 0.03* 0.04*   BNP (last 3 results) No results for input(s): PROBNP in the last 8760 hours. HbA1C:  Recent Labs  12/09/16 1419  HGBA1C 5.7*   CBG:  Recent Labs Lab 12/10/16 1718 12/10/16 2131 12/10/16 2344 12/11/16 0331 12/11/16 0753  GLUCAP 89 87 99 93 106*   Lipid Profile: No results for input(s): CHOL, HDL, LDLCALC, TRIG, CHOLHDL, LDLDIRECT in the last 72 hours. Thyroid Function Tests: No results for input(s): TSH, T4TOTAL, FREET4, T3FREE, THYROIDAB in the last 72 hours. Anemia Panel: No results  for input(s): VITAMINB12, FOLATE, FERRITIN, TIBC, IRON, RETICCTPCT in the last 72 hours. Urine analysis:    Component Value Date/Time   COLORURINE YELLOW 12/09/2016 0416   APPEARANCEUR HAZY (A) 12/09/2016 0416   LABSPEC 1.016 12/09/2016 0416   PHURINE 6.0 12/09/2016 0416   GLUCOSEU NEGATIVE 12/09/2016 0416   HGBUR NEGATIVE 12/09/2016 0416   BILIRUBINUR NEGATIVE 12/09/2016 0416   KETONESUR NEGATIVE 12/09/2016 0416   PROTEINUR 30 (A) 12/09/2016 0416   NITRITE NEGATIVE 12/09/2016 0416   LEUKOCYTESUR NEGATIVE 12/09/2016 0416   Recent Results (from the past 240 hour(s))  Blood Culture (routine x 2)     Status: None (Preliminary result)  Collection Time: 12/09/16  3:52 AM  Result Value Ref Range Status   Specimen Description BLOOD RIGHT FOREARM  Final   Special Requests IN PEDIATRIC BOTTLE Blood Culture adequate volume  Final   Culture NO GROWTH 1 DAY  Final   Report Status PENDING  Incomplete  Blood Culture (routine x 2)     Status: None (Preliminary result)   Collection Time: 12/09/16  4:00 AM  Result Value Ref Range Status   Specimen Description BLOOD LEFT HAND  Final   Special Requests IN PEDIATRIC BOTTLE Blood Culture adequate volume  Final   Culture NO GROWTH 1 DAY  Final   Report Status PENDING  Incomplete  MRSA PCR Screening     Status: None   Collection Time: 12/09/16 12:00 PM  Result Value Ref Range Status   MRSA by PCR NEGATIVE NEGATIVE Final    Comment:        The GeneXpert MRSA Assay (FDA approved for NASAL specimens only), is one component of a comprehensive MRSA colonization surveillance program. It is not intended to diagnose MRSA infection nor to guide or monitor treatment for MRSA infections.   Culture, respiratory (NON-Expectorated)     Status: None (Preliminary result)   Collection Time: 12/09/16 12:30 PM  Result Value Ref Range Status   Specimen Description TRACHEAL ASPIRATE  Final   Special Requests NONE  Final   Gram Stain   Final    ABUNDANT WBC  PRESENT, PREDOMINANTLY PMN FEW SQUAMOUS EPITHELIAL CELLS PRESENT FEW GRAM POSITIVE RODS RARE SQUAMOUS EPITHELIAL CELLS PRESENT RARE GRAM POSITIVE COCCI IN PAIRS    Culture   Final    MODERATE STAPHYLOCOCCUS AUREUS SUSCEPTIBILITIES TO FOLLOW    Report Status PENDING  Incomplete      Radiology Studies: Portable Chest 1 View  Result Date: 12/10/2016 CLINICAL DATA:  Sepsis. EXAM: PORTABLE CHEST 1 VIEW COMPARISON:  12/09/2016 FINDINGS: 0631 hours. There is a new small left-sided pneumothorax. Persistent bibasilar airspace disease, similar to prior. The cardiopericardial silhouette is within normal limits for size. Tracheostomy tube again noted. Telemetry leads overlie the chest. IMPRESSION: New small left pneumothorax. Critical Value/emergent results were called by telephone at the time of interpretation on 12/10/2016 at 8:22 am to Dr. Bonner Puna, who verbally acknowledged these results. Electronically Signed   By: Misty Stanley M.D.   On: 12/10/2016 08:24    Scheduled Meds: . aspirin  325 mg Per Tube Daily  . chlorhexidine gluconate (MEDLINE KIT)  15 mL Mouth Rinse BID  . cloNIDine  0.1 mg Per Tube BID  . clopidogrel  75 mg Per Tube Daily  . enoxaparin (LOVENOX) injection  40 mg Subcutaneous Q24H  . famotidine  20 mg Per Tube BID  . mouth rinse  15 mL Mouth Rinse QID  . metoCLOPramide  10 mg Per Tube TID AC & HS  . metoprolol tartrate  25 mg Per Tube BID  . pravastatin  20 mg Per Tube q1800  . senna  1 tablet Per Tube BID  . sodium chloride flush  3 mL Intravenous Q12H   Continuous Infusions: . dextrose 5 % and 0.45% NaCl 110 mL/hr at 12/11/16 0955  . piperacillin-tazobactam (ZOSYN)  IV Stopped (12/11/16 1115)  . vancomycin Stopped (12/11/16 0655)     LOS: 2 days   Time spent: 35 minutes.  Vance Gather, MD [facility] Pager [phone]  If 7PM-7AM, please contact night-coverage [email or web address] Password [email or web address] [employer] 12/11/2016, 11:21 AM

## 2016-12-11 NOTE — Progress Notes (Signed)
   Family not available.  If/when family visit, please page on-call chaplain, as needed.

## 2016-12-11 NOTE — Progress Notes (Signed)
PULMONARY / CRITICAL CARE MEDICINE   Name: Scott Rivera MRN: [id number] DOB: 11-Dec-1950    ADMISSION DATE:  12/09/2016 CONSULTATION DATE:  12/09/2016  REFERRING MD:  Dr. Evangeline Gula  CHIEF COMPLAINT:  Evaluation of probable aspiration pneumonia and impending sepsis/  Traumatic Left PTX   HISTORY OF PRESENT ILLNESS:   Scott Rivera is a 66 year old male with history of hypertension, CVA, recurrent aspiration requiring tracheostomy and G-tube, who lives in a nursing home and presented to the emergency department after EMS was called out for the patient being found unresponsive. He was previously admitted 10/19/2016 with bi-hemispheric and cerebellar infarcts and sepsis secondary to aspiration pneumonia, with a requirement for mechanical ventilation. Due to recurrent aspirations he had a tracheostomy placed as well as a PEG for feedings. 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 He was discharged to a SNF. Reportedly he was found with a GCS of 3 by nursing home staff and completely unresponsive. During transport , EMS reported O2 sats in the 80s. Upon arrival he was tachycardic and febrile to 101.1 F but not hypotensive. His BP has was transiently labile, falling to as low as 94/53; he was treated for possible L PTX with catheter that was pulled and he has received 5500 cc thus far. With a leukocytosis of 27.6K and PCXR showing a RLL infiltrate, he has been empirically started on Vanc + Cefipime. Although he moves all 4 extremities with open eyes, he does not respond to verbal commands or stimuli.  SUBJECTIVE:  Comfortable on trach collar, PTX noted.  VITAL SIGNS: BP (!) 99/51   Pulse 76   Temp 99.5 F (37.5 C) (Axillary)   Resp (!) 24   Ht 5\' 11"  (1.803 m)   Wt 65.7 kg (144 lb 13.5 oz)   SpO2 100%   BMI 20.20 kg/m   HEMODYNAMICS:    VENTILATOR SETTINGS: FiO2 (%):  [98 %] 98 %  INTAKE / OUTPUT: I/O last 3 completed shifts: In: 2943.2 [I.V.:2038.2;  Blood:125; Other:180; IV Piggyback:600] Out: 5462 [Urine:1650]    PHYSICAL EXAMINATION: General:  Thin male in no acute respiratory distress Neuro: Moves all 4s spont, not consistently f/c HEENT:  [place]/AT; PRRL, arcus senilus; poor dentition; neck supple with trach in place Cardiovascular:  Tachycardic but regular; No murmurs Lungs:  Bibasilar crackles with diminished BS Abdomen:  PEG in situ; no organomegaly, guarding Musculoskeletal:  Surgical scars both knees Skin:  No edema    LABS:  BMET  Recent Labs Lab 12/10/16 0323 12/10/16 1544 12/11/16 0359  NA 157* 153* 151*  K 4.1 4.3 3.9  CL 124* 122* 121*  CO2 23 20* 21*  BUN 38* 34* 28*  CREATININE 1.77* 1.80* 1.82*  GLUCOSE 67 55* 98    Electrolytes  Recent Labs Lab 12/10/16 0323 12/10/16 1544 12/11/16 0359  CALCIUM 11.0* 11.1* 10.5*    CBC  Recent Labs Lab 12/09/16 0350 12/09/16 0614 12/10/16 0323 12/11/16 0359  WBC 27.6*  --  20.2* 16.1*  HGB 9.2* 7.8* 9.9* 7.8*  HCT 30.7* 23.0* 32.8* 25.3*  PLT 836*  --  651* 540*    Coag's No results for input(s): APTT, INR in the last 168 hours.  Sepsis Markers  Recent Labs Lab 12/09/16 0405 12/09/16 0638 12/09/16 1419  LATICACIDVEN 2.94* 1.88  --   PROCALCITON  --   --  1.23    ABG  Recent Labs Lab 12/09/16 1215 12/10/16 0620 12/11/16 0410  PHART 7.411 7.423 7.447  PCO2ART 38.4 35.4 32.4  PO2ART 64.2* 75.5* 107    Liver Enzymes  Recent Labs Lab 12/09/16 0350 12/10/16 0323  AST 62* 30  ALT 75* 55  ALKPHOS 230* 192*  BILITOT 1.1 0.6  ALBUMIN 2.2* 2.0*    Cardiac Enzymes  Recent Labs Lab 12/09/16 1419 12/10/16 0803 12/10/16 1315  TROPONINI 0.17* 0.03* 0.04*    Glucose  Recent Labs Lab 12/10/16 0735 12/10/16 1718 12/10/16 2131 12/10/16 2344 12/11/16 0331 12/11/16 0753  GLUCAP 82 89 87 99 93 106*    Imaging   STUDIES:  ECG: No acute ST-T changes    CULTURES: MRSA screen  8/25 > neg   trach asp 8/25 >   abudant pmns,  Min mixed orgs >>> BC x 2  8/25 >>>  ANTIBIOTICS: Vanc + Cefipime  8/25 >>>   I reviewed CXR myself, PTX on the left noted.  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  Rush Farmer, M.D. [facility] Pulmonary/Critical Care Medicine. Pager: [phone]. After hours pager: [phone]  12/11/2016, 11:52 AM

## 2016-12-12 ENCOUNTER — Inpatient Hospital Stay (HOSPITAL_COMMUNITY): Payer: Medicare HMO

## 2016-12-12 DIAGNOSIS — R0902 Hypoxemia: Secondary | ICD-10-CM

## 2016-12-12 LAB — CULTURE, RESPIRATORY W GRAM STAIN

## 2016-12-12 LAB — GLUCOSE, CAPILLARY
Glucose-Capillary: 107 mg/dL — ABNORMAL HIGH (ref 65–99)
Glucose-Capillary: 112 mg/dL — ABNORMAL HIGH (ref 65–99)
Glucose-Capillary: 116 mg/dL — ABNORMAL HIGH (ref 65–99)
Glucose-Capillary: 117 mg/dL — ABNORMAL HIGH (ref 65–99)
Glucose-Capillary: 117 mg/dL — ABNORMAL HIGH (ref 65–99)
Glucose-Capillary: 82 mg/dL (ref 65–99)

## 2016-12-12 LAB — CBC
HCT: 25.1 % — ABNORMAL LOW (ref 39.0–52.0)
Hemoglobin: 7.5 g/dL — ABNORMAL LOW (ref 13.0–17.0)
MCH: 27.9 pg (ref 26.0–34.0)
MCHC: 29.9 g/dL — ABNORMAL LOW (ref 30.0–36.0)
MCV: 93.3 fL (ref 78.0–100.0)
Platelets: 511 K/uL — ABNORMAL HIGH (ref 150–400)
RBC: 2.69 MIL/uL — ABNORMAL LOW (ref 4.22–5.81)
RDW: 17.3 % — ABNORMAL HIGH (ref 11.5–15.5)
WBC: 14.9 K/uL — ABNORMAL HIGH (ref 4.0–10.5)

## 2016-12-12 LAB — BASIC METABOLIC PANEL WITH GFR
Anion gap: 8 (ref 5–15)
BUN: 24 mg/dL — ABNORMAL HIGH (ref 6–20)
CO2: 23 mmol/L (ref 22–32)
Calcium: 10.3 mg/dL (ref 8.9–10.3)
Chloride: 117 mmol/L — ABNORMAL HIGH (ref 101–111)
Creatinine, Ser: 1.49 mg/dL — ABNORMAL HIGH (ref 0.61–1.24)
GFR calc Af Amer: 55 mL/min — ABNORMAL LOW
GFR calc non Af Amer: 47 mL/min — ABNORMAL LOW
Glucose, Bld: 138 mg/dL — ABNORMAL HIGH (ref 65–99)
Potassium: 3.7 mmol/L (ref 3.5–5.1)
Sodium: 148 mmol/L — ABNORMAL HIGH (ref 135–145)

## 2016-12-12 MED ORDER — FREE WATER
200.0000 mL | Status: DC
  Administered 2016-12-12 – 2016-12-21 (×57): 200 mL

## 2016-12-12 MED ORDER — JEVITY 1.5 CAL/FIBER PO LIQD
1000.0000 mL | ORAL | Status: DC
  Administered 2016-12-15 – 2016-12-21 (×7): 1000 mL
  Filled 2016-12-12 (×15): qty 1000

## 2016-12-12 NOTE — Progress Notes (Signed)
PULMONARY / CRITICAL CARE MEDICINE   Name: Scott Rivera MRN: [id number] DOB: Nov 12, 1950    ADMISSION DATE:  12/09/2016 CONSULTATION DATE:  12/09/2016  REFERRING MD:  Dr. Evangeline Gula  CHIEF COMPLAINT:  Evaluation of probable aspiration pneumonia and impending sepsis/  Traumatic Left PTX   HISTORY OF PRESENT ILLNESS:   Scott Rivera is a 66 year old male with history of hypertension, CVA, recurrent aspiration requiring tracheostomy and G-tube, who lives in a nursing home and presented to the emergency department after EMS was called out for the patient being found unresponsive. He was previously admitted 10/19/2016 with bi-hemispheric and cerebellar infarcts and sepsis secondary to aspiration pneumonia, with a requirement for mechanical ventilation. Due to recurrent aspirations he had a tracheostomy placed as well as a PEG for feedings. 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 He was discharged to a SNF. Reportedly he was found with a GCS of 3 by nursing home staff and completely unresponsive. During transport , EMS reported O2 sats in the 80s. Upon arrival he was tachycardic and febrile to 101.1 F but not hypotensive. His BP has was transiently labile, falling to as low as 94/53; he was treated for possible L PTX with catheter that was pulled and he has received 5500 cc thus far. With a leukocytosis of 27.6K and PCXR showing a RLL infiltrate, he has been empirically started on Vanc + Cefipime. Although he moves all 4 extremities with open eyes, he does not respond to verbal commands or stimuli.  SUBJECTIVE:  Lying comfortably  VITAL SIGNS: BP 116/64   Pulse 84   Temp 99 F (37.2 C) (Oral)   Resp 19   Ht 5\' 11"  (1.803 m)   Wt 143 lb 4.8 oz (65 kg)   SpO2 98%   BMI 19.99 kg/m   HEMODYNAMICS:    VENTILATOR SETTINGS: FiO2 (%):  [98 %] 98 %  INTAKE / OUTPUT:  Intake/Output Summary (Last 24 hours) at 12/12/16 1132 Last data filed at 12/12/16 0700   Gross per 24 hour  Intake             1318 ml  Output              900 ml  Net              418 ml    PHYSICAL EXAMINATION: General appearance:  Chronically ill appearing 66 Year old  Male cachectic  NAD,  Eyes: anicteric sclerae, moist conjunctivae; PERRL, EOMI bilaterally. Mouth:  membranes and no mucosal ulcerations; normal hard and soft palate Neck: Trachea midline; neck supple, no JVD trach unremarkable. Thick yellow secretions  Lungs/chest: scattered rhonchi, with normal respiratory effort and no intercostal retractions CV: RRR, no MRGs  Abdomen: Soft, non-tender; no masses or HSM Extremities: No peripheral edema or extremity lymphadenopathy Skin: Normal temperature, turgor and texture; no rash, ulcers or subcutaneous nodules Psych: Appropriate affect, alert and oriented to person, place and time   LABS:  BMET  Recent Labs Lab 12/10/16 1544 12/11/16 0359 12/12/16 0721  NA 153* 151* 148*  K 4.3 3.9 3.7  CL 122* 121* 117*  CO2 20* 21* 23  BUN 34* 28* 24*  CREATININE 1.80* 1.82* 1.49*  GLUCOSE 55* 98 138*    Electrolytes  Recent Labs Lab 12/10/16 1544 12/11/16 0359 12/12/16 0721  CALCIUM 11.1* 10.5* 10.3    CBC  Recent Labs Lab 12/10/16 0323 12/11/16 0359 12/12/16 0721  WBC 20.2* 16.1* 14.9*  HGB 9.9*  7.8* 7.5*  HCT 32.8* 25.3* 25.1*  PLT 651* 540* 511*    Coag's No results for input(s): APTT, INR in the last 168 hours.  Sepsis Markers  Recent Labs Lab 12/09/16 0405 12/09/16 0638 12/09/16 1419  LATICACIDVEN 2.94* 1.88  --   PROCALCITON  --   --  1.23    ABG  Recent Labs Lab 12/09/16 1215 12/10/16 0620 12/11/16 0410  PHART 7.411 7.423 7.447  PCO2ART 38.4 35.4 32.4  PO2ART 64.2* 75.5* 107    Liver Enzymes  Recent Labs Lab 12/09/16 0350 12/10/16 0323  AST 62* 30  ALT 75* 55  ALKPHOS 230* 192*  BILITOT 1.1 0.6  ALBUMIN 2.2* 2.0*    Cardiac Enzymes  Recent Labs Lab 12/09/16 1419 12/10/16 0803 12/10/16 1315   TROPONINI 0.17* 0.03* 0.04*    Glucose  Recent Labs Lab 12/11/16 1211 12/11/16 1617 12/11/16 2048 12/12/16 0030 12/12/16 0452 12/12/16 0742  GLUCAP 115* 114* 99 117* 117* 116*    Imaging   STUDIES:  ECG: No acute ST-T changes    CULTURES: MRSA screen  8/25 > neg  trach asp 8/25 >  SA in mixed culture BC x 2  8/25 >>>  ANTIBIOTICS: zosyn  8/25 >>>  vanc 8/25>>>8/28  I reviewed CXR myself, PTX on the left noted.  ASSESSMENT / PLAN:  probable recurrent asp pna  = HCAP (MSSA) w/in mixed culture  Plan Dc vanc Complete 8d abx   Traumatic L PTX, no resp distress -improved in size. 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  Patient has extensive PMH as above.  On exam, patient has bilateral BS.  I reviewed CXR myself, PTX stable.  Discussed with PCCM-NP.  HCA:  - D/C vanc  - Complete 8 days of abx  PTX:  - Maintain on 100% ATC  - CXR in AM  CVA:  - Supportive care  Trach stat:  - Keep current type and size of trach  PCCM will see again in AM  Patient seen and examined, agree with above note.  I dictated the care and orders written for this patient under my direction.  Rush Farmer, MD [phone]  12/12/2016, 11:28 AM

## 2016-12-12 NOTE — Plan of Care (Signed)
Problem: Physical Regulation: Goal: Ability to maintain clinical measurements within normal limits will improve Outcome: Progressing Patient with increased secretions; however, he is able to cough most of them up on his own.  Goal: Will remain free from infection Outcome: Progressing Patient receiving vancomycin and zosyn IVPB.   Problem: Skin Integrity: Goal: Risk for impaired skin integrity will decrease Outcome: Progressing Turns at intervals and foam to bottom. Prevalon boots on.   Problem: Tissue Perfusion: Goal: Risk factors for ineffective tissue perfusion will decrease Outcome: Progressing SCD's on.

## 2016-12-12 NOTE — Progress Notes (Signed)
PROGRESS NOTE  Scott Rivera  XKG:[id number] DOB: Apr 22, 1950 DOA: 12/09/2016 PCP: Patient, No Pcp Per   Brief Narrative: Scott Rivera is a 66 y.o. 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 On EMS arrival he was reported to have tube feeding from tracheostomy site, and had no left-sided breath sounds, so needle decompression was performed en route. On arrival to ED he was febrile to 101F, tachycardic to 152bpm, BP 119/55, RR 38/min. He was not meaningfully responsive. CXR demonstrated increasing right, and unchanged left, lung base opacities, without pneumothorax. WBC 27.6, lactic acid 2.94. Hypernatremia was noted with Na 154, up to 158 with isotonic saline.   He was admitted for sepsis due to aspiration pneumonia. IV fluids and vancomycin/cefepime were given for sepsis. Lactic acid normalized. Hgb dropped 9.2 > 7.8 with IV fluids, so 2u PRBCs given with improvement to 9.9. Troponin was mildly elevated but with downward trend 0.17 > 0.03. Cr has improved from 2.47 on admission. Zosyn has been continued for aspiration pneumonia and tube feeds were titrated back to 50cc/hr on 8/27.    Recent history per HPI:  He was initially admitted to [facility] on July 1 for massive CVA, transferred to our facility on July 5, and eventually discharged to [facility] on August 18. During that hospitalization he had difficulties with kidney injury, hypernatremia, and severe leukocytosis. The patient's sister states that he has been improving while at the nursing facility. His baseline was unresponsive but he can lift up his legs and occasionally points with his fingers and moves his mouth in an attempt to communicate. He remains nonverbal with a trach collar and PEG tube. He's continued to have tube feeds as he has  not been able to have his diet advanced yet with speech therapy.  Assessment & Plan: Principal Problem:   Severe sepsis with acute organ dysfunction [facility]) Active Problems:   Essential hypertension   Dysphagia, post-stroke   Tobacco abuse   Tachypnea   Hyperglycemia   Hypernatremia   Leukocytosis (leucocytosis)   Aspiration pneumonia (HCC)   Goals of care, counseling/discussion   Hypotension   Acute-on-chronic kidney injury ([place])   Elevated troponin   Anemia due to chronic kidney disease   Compensated metabolic alkalosis   Pneumothorax, closed, traumatic, initial encounter   Tracheostomy status ([place])   Pneumothorax  Severe sepsis due to multilobar aspiration pneumonia/pneumonitis: Recurrent aspirations since CVA in July 2018. Lactate elevate resolved.  - Sepsis physiology resolving. - Continue zosyn for MSSA (on resp culture) and anaerobic coverage pending blood cultures.  - Continue trach care/suctioning - Appreciate PCCM consultation - Restarted tube feeds, monitor closely.   Acute on chronic hypoxic respiratory failure: Trach-dependent.  - Increase FiO2 as below, per pulm.  Acute renal failure on stage III CKD: Cr at recent discharge was 1.34, presumed baseline. SCr on arrival 2.47, improving with IVF's consistent with prerenal azotemia due to dehydration (with hypernatremia) and sepsis.  - Restarted tube feeds with free water. SCr continues to improve.  - Continue to monitor BMP, UOP.   Elevated troponin: Due to demand ischemia in setting of aspiration event/sepsis. Downward trend, no acute changes on ECG.  - No further intervention planned at this time  Traumatic pneumothorax: On CXR 8/26. No mediastinal shift. Stable on 8/28.  - Pulmonology recommends increasing FiO2 for nitrogen  washout.   Cerebrovascular disease s/p CVAs: Neuroimaging from early July demonstrate acute infarction in the left more than right cerebellum, right midbrain, left thalamus, and right  occipital lobe. Extensive remote infarction in the perisylvian left MCA territory and distal left ACA territories. - Continue ASA, plavix - Will consult SLP if mental status improves.   Severe protein-calorie malnutrition: Due to acute/chronic illness.  - Nutrition consulted. Restarted tube feeds 8/27:  Jevity 1.5 at goal 50cc/hr  Pro-stat 30 ml BID  200 ml free water q4h  Hypernatremia: Suspect free water deficit due to tube feeds. Improved with hypotonic saline and free water per tube with feeds.  - Monitor BMP, improving.   Essential HTN: BPs creeping back up.  - Restart clonidine and metoprolol. Hold norvasc. Hold clonidine for MAP < 70. - Labetalol IV prn severe HTN.   Anemia due to chronic kidney disease:  - With acute drop more likely hemodilution. Discharge hgb noted to be 7.3, which is actually around his baseline with persistent values in 7 range. Transfused 2u PRBCs with improvement to 9.9, now back down. Will monitor closely, transfuse only for hgb < 7 or bleeding.  - Continue to monitor  Tobacco use:  - Nicotine patch if needed.  Neutrophilic leukocytosis: Pt noted to have persistent leukocytosis during previous admission, planned hematology follow up. On review of Care Everywhere, WBC has been 9.4 - 10.2 dating back to 2012. The current leukocytosis is most likely related to infection, probable ongoing microaspiration, and stress demargination, much less likely is primary bone marrow abnormality. Thrombocytosis also noted, thought to be reactive.  - Will monitor with treatment. Can consider hematology consultation vs. referral pending clinical course. Improving.  Goals of care: The medical team suspects continued aspiration even if he recovers from this particular episode. This has been addressed repeatedly during previous hospitalization and at admission by admitting MD. Palliative care has been consulted, though family has confirmed their desire to pursue all  aggressive medical interventions.  - Appreciate palliative care continuing to follow this patient.  - Will continue ongoing discussions, particularly if no further meaningful improvement is actualized.   DVT prophylaxis: Lovenox Code Status: Full, confirmed  Family Communication: None at bedside this AM. Have been discussing with sister daily.  Disposition Plan: Uncertain, pending clinical course.   Consultants:   Pulmonology/ critical care medicine  Palliative care team  Procedures:   Left pneumothorax needle decompression 8/25  Antimicrobials:  Vancomycin 8/25 >> 8/28  Cefepime 8/25  Zosyn 8/26 >>   Subjective: More alert, comfortable. No events overnight.  Objective: Vitals:   12/12/16 0900 12/12/16 0911 12/12/16 0916 12/12/16 1134  BP: (!) 92/47  116/64 117/81  Pulse: 81 84  90  Resp: 20  19 (!) 24  Temp:      TempSrc:      SpO2: 98%   97%  Weight:      Height:        Intake/Output Summary (Last 24 hours) at 12/12/16 1155 Last data filed at 12/12/16 0700  Gross per 24 hour  Intake             1318 ml  Output              900 ml  Net              418 ml   Filed Weights   12/10/16 0406 12/11/16 0500 12/12/16 0439  Weight: 63.7 kg (140 lb 6.9 oz) 65.7 kg (144 lb 13.5 oz)  65 kg (143 lb 4.8 oz)    Gen: Ill-appearing, thin 66yo male in no acute distress Neck: Supple, trach in place with thick yellow secretions Pulm: Non-labored breathing with trach collar. SpO2 100%. Diffusely rhonchorous breath sounds, breath sounds symmetric.  CV: Regular rate and rhythm, narrow complex. No murmur or JVD or edema. GI: Abdomen soft, no reaction to palpation. PEG in place c/d/i. + BS.  Ext: Warm, no deformities, mittens on bilaterally.  Skin: As above and no ulcerations noted. Neuro: Resting with eyes opening intermittently, will open eyes to voice, moving all extremities. Does not track or make eye contact today. Psych: UTD due to AMS.  Data Reviewed: I have personally  reviewed following labs and imaging studies  CBC:  Recent Labs Lab 12/09/16 0350 12/09/16 0614 12/10/16 0323 12/11/16 0359 12/12/16 0721  WBC 27.6*  --  20.2* 16.1* 14.9*  NEUTROABS 22.3*  --   --   --   --   HGB 9.2* 7.8* 9.9* 7.8* 7.5*  HCT 30.7* 23.0* 32.8* 25.3* 25.1*  MCV 94.5  --  93.4 93.0 93.3  PLT 836*  --  651* 540* 782*   Basic Metabolic Panel:  Recent Labs Lab 12/09/16 1419 12/10/16 0323 12/10/16 1544 12/11/16 0359 12/12/16 0721  NA 157* 157* 153* 151* 148*  K 4.4 4.1 4.3 3.9 3.7  CL 124* 124* 122* 121* 117*  CO2 23 23 20* 21* 23  GLUCOSE 88 67 55* 98 138*  BUN 48* 38* 34* 28* 24*  CREATININE 1.88* 1.77* 1.80* 1.82* 1.49*  CALCIUM 10.5* 11.0* 11.1* 10.5* 10.3   GFR: Estimated Creatinine Clearance: 44.8 mL/min (A) (by C-G formula based on SCr of 1.49 mg/dL (H)). Liver Function Tests:  Recent Labs Lab 12/09/16 0350 12/10/16 0323  AST 62* 30  ALT 75* 55  ALKPHOS 230* 192*  BILITOT 1.1 0.6  PROT 9.5* 8.4*  ALBUMIN 2.2* 2.0*   No results for input(s): LIPASE, AMYLASE in the last 168 hours. No results for input(s): AMMONIA in the last 168 hours. Coagulation Profile: No results for input(s): INR, PROTIME in the last 168 hours. Cardiac Enzymes:  Recent Labs Lab 12/09/16 0350 12/09/16 1419 12/10/16 0803 12/10/16 1315  TROPONINI 0.03* 0.17* 0.03* 0.04*   BNP (last 3 results) No results for input(s): PROBNP in the last 8760 hours. HbA1C:  Recent Labs  12/09/16 1419  HGBA1C 5.7*   CBG:  Recent Labs Lab 12/11/16 1617 12/11/16 2048 12/12/16 0030 12/12/16 0452 12/12/16 0742  GLUCAP 114* 99 117* 117* 116*   Lipid Profile: No results for input(s): CHOL, HDL, LDLCALC, TRIG, CHOLHDL, LDLDIRECT in the last 72 hours. Thyroid Function Tests: No results for input(s): TSH, T4TOTAL, FREET4, T3FREE, THYROIDAB in the last 72 hours. Anemia Panel: No results for input(s): VITAMINB12, FOLATE, FERRITIN, TIBC, IRON, RETICCTPCT in the last 72  hours. Urine analysis:    Component Value Date/Time   COLORURINE YELLOW 12/09/2016 0416   APPEARANCEUR HAZY (A) 12/09/2016 0416   LABSPEC 1.016 12/09/2016 0416   PHURINE 6.0 12/09/2016 0416   GLUCOSEU NEGATIVE 12/09/2016 0416   HGBUR NEGATIVE 12/09/2016 0416   BILIRUBINUR NEGATIVE 12/09/2016 0416   KETONESUR NEGATIVE 12/09/2016 0416   PROTEINUR 30 (A) 12/09/2016 0416   NITRITE NEGATIVE 12/09/2016 0416   LEUKOCYTESUR NEGATIVE 12/09/2016 0416   Recent Results (from the past 240 hour(s))  Blood Culture (routine x 2)     Status: None (Preliminary result)   Collection Time: 12/09/16  3:52 AM  Result Value Ref Range Status  Specimen Description BLOOD RIGHT FOREARM  Final   Special Requests IN PEDIATRIC BOTTLE Blood Culture adequate volume  Final   Culture NO GROWTH 2 DAYS  Final   Report Status PENDING  Incomplete  Blood Culture (routine x 2)     Status: None (Preliminary result)   Collection Time: 12/09/16  4:00 AM  Result Value Ref Range Status   Specimen Description BLOOD LEFT HAND  Final   Special Requests IN PEDIATRIC BOTTLE Blood Culture adequate volume  Final   Culture NO GROWTH 2 DAYS  Final   Report Status PENDING  Incomplete  MRSA PCR Screening     Status: None   Collection Time: 12/09/16 12:00 PM  Result Value Ref Range Status   MRSA by PCR NEGATIVE NEGATIVE Final    Comment:        The GeneXpert MRSA Assay (FDA approved for NASAL specimens only), is one component of a comprehensive MRSA colonization surveillance program. It is not intended to diagnose MRSA infection nor to guide or monitor treatment for MRSA infections.   Culture, respiratory (NON-Expectorated)     Status: None   Collection Time: 12/09/16 12:30 PM  Result Value Ref Range Status   Specimen Description TRACHEAL ASPIRATE  Final   Special Requests NONE  Final   Gram Stain   Final    ABUNDANT WBC PRESENT, PREDOMINANTLY PMN FEW SQUAMOUS EPITHELIAL CELLS PRESENT FEW GRAM POSITIVE RODS RARE  SQUAMOUS EPITHELIAL CELLS PRESENT RARE GRAM POSITIVE COCCI IN PAIRS    Culture   Final    MODERATE STAPHYLOCOCCUS AUREUS WITHIN MIXED CULTURE    Report Status 12/12/2016 FINAL  Final   Organism ID, Bacteria STAPHYLOCOCCUS AUREUS  Final      Susceptibility   Staphylococcus aureus - MIC*    CIPROFLOXACIN <=0.5 SENSITIVE Sensitive     ERYTHROMYCIN <=0.25 SENSITIVE Sensitive     GENTAMICIN <=0.5 SENSITIVE Sensitive     OXACILLIN <=0.25 SENSITIVE Sensitive     TETRACYCLINE 8 INTERMEDIATE Intermediate     VANCOMYCIN <=0.5 SENSITIVE Sensitive     TRIMETH/SULFA <=10 SENSITIVE Sensitive     CLINDAMYCIN <=0.25 SENSITIVE Sensitive     RIFAMPIN <=0.5 SENSITIVE Sensitive     Inducible Clindamycin NEGATIVE Sensitive     * MODERATE STAPHYLOCOCCUS AUREUS      Radiology Studies: Dg Chest Port 1 View  Result Date: 12/12/2016 CLINICAL DATA:  Check tracheostomy placement EXAM: PORTABLE CHEST 1 VIEW COMPARISON:  12/11/2016 FINDINGS: Tracheostomy tube is again noted in satisfactory position. Cardiac shadow is stable. Left pneumothorax is again identified and stable. Patchy right basilar infiltrate is noted. No acute bony abnormality is seen. Chronic changes in the shoulder joints are noted. IMPRESSION: Stable left apical pneumothorax. Mild patchy changes in the right base. Electronically Signed   By: Inez Catalina M.D.   On: 12/12/2016 07:36   Dg Chest Port 1 View  Result Date: 12/11/2016 CLINICAL DATA:  Pneumothorax. EXAM: PORTABLE CHEST 1 VIEW COMPARISON:  Chest x-ray 12/10/2016.  Chest x-ray 09/03/2016 FINDINGS: Tracheostomy tube noted in stable position. Cardiomegaly with pulmonary vascular prominence and bilateral interstitial prominence consistent with CHF. Findings have worsened slightly from prior exam . Pneumonitis cannot be excluded. No prominent pleural effusion. Small left pneumothorax is again noted without significant change. IMPRESSION: 1. Tracheostomy tube in stable position. 2.  Small  left-sided pneumothorax noted without significant change. 3. Cardiomegaly with pulmonary vascular prominence and interstitial prominence suggesting CHF. Pneumonitis cannot be excluded. These findings have worsened slightly from prior exam. Electronically Signed  By: [place]   On: 12/11/2016 13:05    Scheduled Meds: . aspirin  325 mg Per Tube Daily  . chlorhexidine gluconate (MEDLINE KIT)  15 mL Mouth Rinse BID  . cloNIDine  0.1 mg Per Tube BID  . clopidogrel  75 mg Per Tube Daily  . enoxaparin (LOVENOX) injection  40 mg Subcutaneous Q24H  . famotidine  20 mg Per Tube BID  . feeding supplement (PRO-STAT SUGAR FREE 64)  30 mL Oral BID  . free water  200 mL Per Tube Q4H  . mouth rinse  15 mL Mouth Rinse QID  . metoCLOPramide  10 mg Per Tube TID AC & HS  . metoprolol tartrate  25 mg Per Tube BID  . pravastatin  20 mg Per Tube q1800  . senna  1 tablet Per Tube BID  . sodium chloride flush  3 mL Intravenous Q12H   Continuous Infusions: . feeding supplement (JEVITY 1.5 CAL/FIBER) 1,000 mL (12/11/16 2230)  . piperacillin-tazobactam (ZOSYN)  IV Stopped (12/12/16 0913)  . vancomycin Stopped (12/12/16 0605)     LOS: 3 days   Time spent: 35 minutes.  Vance Gather, MD [facility] Pager [phone]  If 7PM-7AM, please contact night-coverage [email or web address] Password [facility] 12/12/2016, 11:55 AM

## 2016-12-12 NOTE — Clinical Social Work Note (Addendum)
Clinical Social Work Assessment  Patient Details  Name: Scott Rivera MRN: [id number] Date of Birth: Apr 16, 1951  Date of referral:  12/12/16               Reason for consult:  Facility Placement                Permission sought to share information with:  Facility Sport and exercise psychologist, Family Supports Permission granted to share information::  No (pt disoriented- spoke with Ladean Raya)  Name::     Medical illustrator::  SNF  Relationship::  sister  Contact Information:     Housing/Transportation Living arrangements for the past 2 months:  [place] of Information:  Other (Comment Required) (siblings) Patient Interpreter Needed:  None Criminal Activity/Legal Involvement Pertinent to Current Situation/Hospitalization:  No - Comment as needed Significant Relationships:  Siblings Lives with:  Self Do you feel safe going back to the place where you live?  No Need for family participation in patient care:  Yes (Comment) (decision making)  Care giving concerns:  Pt at rehab for short term therapy prior to admission.  No concerns expressed regarding current SNF.   Social Worker assessment / plan:  CSW spoke with pt sister concerning plan for time of DC.  Pt sister confirms that pt was at [facility] for only a few days before requiring readmission.  Sister would like for pt to continue getting rehab when stable to leave the hospital.  Pt sister also asking about letter from hospital explaining that pt has been in the hospital and then rehab- she needs this for pt apartment through [employer] so they know he has been requiring medical care and that is why he has not been at his residence- CSW provided pt sister with requested letter.  Employment status:  Retired Nurse, adult PT Recommendations:    Information / Referral to community resources:  [place]  Patient/Family's Response to care:  Agreeable to return to rehab  when stable.  Medical team expresses concerns about pt prognosis but family not agreeable to comfort measures at this time.  Patient/Family's Understanding of and Emotional Response to Diagnosis, Current Treatment, and Prognosis: Sister determined to get pt all possible care with hope that he will imrprove- per palliative note there appears to be unrealistic expectations of pt improvement.  Emotional Assessment Appearance:  Appears stated age Attitude/Demeanor/Rapport:  Unable to Assess Affect (typically observed):  Unable to Assess Orientation:  Oriented to Self Alcohol / Substance use:  Not Applicable Psych involvement (Current and /or in the community):  No (Comment)  Discharge Needs  Concerns to be addressed:  Care Coordination Readmission within the last 30 days:  Yes Current discharge risk:  Physical Impairment Barriers to Discharge:  Continued Medical Work up, [employer]   Jorge Ny, LCSW 12/12/2016, 1:23 PM

## 2016-12-12 NOTE — NC FL2 (Signed)
[place] LEVEL OF CARE SCREENING TOOL     IDENTIFICATION  Patient Name: Scott Rivera Birthdate: 01/12/1951 Sex: male Admission Date (Current Location): 12/09/2016  [facility] and [place] Number:  Herbalist and Address:  [facility], [place] [address], [place], [place] [address]      Provider Number: [id number]  Attending Physician Name and Address:  Patrecia Pour, MD  Relative Name and Phone Number:       Current Level of Care: Hospital Recommended Level of Care: [place] Prior Approval Number:    Date Approved/Denied:   PASRR Number: [id number]  Discharge Plan: SNF    Current Diagnoses: Patient Active Problem List   Diagnosis Date Noted  . Tracheostomy status ([place])   . Pneumothorax   . Pneumothorax, closed, traumatic, initial encounter 12/10/2016  . Severe sepsis with acute organ dysfunction ([place]) 12/09/2016  . Hypotension 12/09/2016  . Acute-on-chronic kidney injury ([place]) 12/09/2016  . Elevated troponin 12/09/2016  . Anemia due to chronic kidney disease 12/09/2016  . Compensated metabolic alkalosis 66/09/3014  . Aspiration pneumonia ([place])   . Goals of care, counseling/discussion   . Palliative care by specialist   . Acute respiratory failure ([place])   . Aspiration into airway   . Septic shock ([place])   . Dysphagia, post-stroke   . Benign essential HTN   . Tobacco abuse   . Tachypnea   . Hyperglycemia   . Hypernatremia   . Leukocytosis (leucocytosis)   . Acute blood loss anemia   . Chronic kidney disease   . Pressure injury of skin 10/20/2016  . Cerebellar infarct ([place]) 10/19/2016  . ARF (acute renal failure) ([place]) 10/19/2016  . Essential hypertension 10/19/2016  . Rhabdomyolysis 10/19/2016  . Acute ischemic stroke ([place]) 10/19/2016  . Bilateral chronic knee pain 01/07/2016  . History of adenomatous polyp of colon 07/28/2015  . Hyperlipidemia 07/28/2013  . Helicobacter pylori infection  09/21/2012    Orientation RESPIRATION BLADDER Height & Weight        Tracheostomy, O2 (33m shiley uncuffed) Incontinent, External catheter Weight: 143 lb 4.8 oz (65 kg) Height:  '5\' 11"'$  (180.3 cm)  BEHAVIORAL SYMPTOMS/MOOD NEUROLOGICAL BOWEL NUTRITION STATUS      Incontinent Feeding tube  AMBULATORY STATUS COMMUNICATION OF NEEDS Skin   Extensive Assist   PU Stage and Appropriate Care (unstageable wound- foam dressing changes)                       Personal Care Assistance Level of Assistance  Bathing, Dressing Bathing Assistance: Maximum assistance Feeding assistance: Maximum assistance Dressing Assistance: Maximum assistance     Functional Limitations Info  Speech     Speech Info: Impaired    SPECIAL CARE FACTORS FREQUENCY  PT (By licensed PT), OT (By licensed OT)     PT Frequency: 5/wk OT Frequency: 5/wk            Contractures      Additional Factors Info  Code Status, Allergies, Suctioning Needs Code Status Info: FULL Allergies Info: NKA       Suctioning Needs: q4   Current Medications (12/12/2016):  This is the current hospital active medication list Current Facility-Administered Medications  Medication Dose Route Frequency Provider Last Rate Last Dose  . acetaminophen (TYLENOL) tablet 650 mg  650 mg Oral Q6H PRN SLady Deutscher MD       Or  . acetaminophen (TYLENOL) suppository 650 mg  650 mg  Rectal Q6H PRN Lady Deutscher, MD      . aspirin tablet 325 mg  325 mg Per Tube Daily Lady Deutscher, MD   325 mg at 12/11/16 0953  . chlorhexidine gluconate (MEDLINE KIT) (PERIDEX) 0.12 % solution 15 mL  15 mL Mouth Rinse BID Lady Deutscher, MD   15 mL at 12/11/16 2200  . cloNIDine (CATAPRES) tablet 0.1 mg  0.1 mg Per Tube BID Vance Gather B, MD   0.1 mg at 12/12/16 0912  . clopidogrel (PLAVIX) tablet 75 mg  75 mg Per Tube Daily Lady Deutscher, MD   75 mg at 12/12/16 0912  . enoxaparin (LOVENOX) injection 40 mg  40 mg Subcutaneous Q24H Dang,  Thuy D, RPH   40 mg at 12/11/16 1323  . famotidine (PEPCID) 40 MG/5ML suspension 20 mg  20 mg Per Tube BID Lady Deutscher, MD   20 mg at 12/12/16 0912  . feeding supplement (JEVITY 1.5 CAL/FIBER) liquid 1,000 mL  1,000 mL Per Tube Continuous Patrecia Pour, MD 50 mL/hr at 12/11/16 2230 1,000 mL at 12/11/16 2230  . feeding supplement (PRO-STAT SUGAR FREE 64) liquid 30 mL  30 mL Oral BID Vance Gather B, MD   30 mL at 12/12/16 0911  . free water 200 mL  200 mL Per Tube Q4H Vance Gather B, MD   200 mL at 12/12/16 0912  . labetalol (NORMODYNE,TRANDATE) injection 5-10 mg  5-10 mg Intravenous Q2H PRN Opyd, Ilene Qua, MD   5 mg at 12/10/16 1427  . MEDLINE mouth rinse  15 mL Mouth Rinse QID Lady Deutscher, MD   15 mL at 12/12/16 0442  . metoCLOPramide (REGLAN) 5 MG/5ML solution 10 mg  10 mg Per Tube TID AC & HS Lady Deutscher, MD   10 mg at 12/12/16 0912  . metoprolol tartrate (LOPRESSOR) 25 mg/10 mL oral suspension 25 mg  25 mg Per Tube BID Patrecia Pour, MD   25 mg at 12/12/16 0911  . metoprolol tartrate (LOPRESSOR) injection 5 mg  5 mg Intravenous Q2H PRN Patrecia Pour, MD   5 mg at 12/10/16 0942  . morphine 4 MG/ML injection 2 mg  2 mg Intravenous Q3H PRN Lady Deutscher, MD      . morphine 4 MG/ML injection 4 mg  4 mg Intravenous Q3H PRN Lady Deutscher, MD      . piperacillin-tazobactam (ZOSYN) IVPB 3.375 g  3.375 g Intravenous Q8H Bajbus, Lauren D, RPH   Stopped at 12/12/16 0913  . pravastatin (PRAVACHOL) tablet 20 mg  20 mg Per Tube q1800 Lady Deutscher, MD   20 mg at 12/11/16 1730  . senna (SENOKOT) tablet 8.6 mg  1 tablet Per Tube BID Lady Deutscher, MD   8.6 mg at 12/12/16 0912  . sodium chloride flush (NS) 0.9 % injection 3 mL  3 mL Intravenous Q12H Lady Deutscher, MD   3 mL at 12/11/16 2227  . traMADol (ULTRAM) tablet 50 mg  50 mg Oral BID PRN Lady Deutscher, MD      . vancomycin (VANCOCIN) IVPB 1000 mg/200 mL premix  1,000 mg Intravenous Q24H Reginia Naas,  [facility]   Stopped at 12/12/16 5638     Discharge Medications: Please see discharge summary for a list of discharge medications.  Relevant Imaging Results:  Relevant Lab Results:   Additional Information SS#: [id number]  Jorge Ny, LCSW

## 2016-12-13 ENCOUNTER — Inpatient Hospital Stay (HOSPITAL_COMMUNITY): Payer: Medicare HMO

## 2016-12-13 LAB — CBC
HCT: 26.6 % — ABNORMAL LOW (ref 39.0–52.0)
Hemoglobin: 8.1 g/dL — ABNORMAL LOW (ref 13.0–17.0)
MCH: 28.6 pg (ref 26.0–34.0)
MCHC: 30.5 g/dL (ref 30.0–36.0)
MCV: 94 fL (ref 78.0–100.0)
Platelets: 475 10*3/uL — ABNORMAL HIGH (ref 150–400)
RBC: 2.83 MIL/uL — ABNORMAL LOW (ref 4.22–5.81)
RDW: 17.2 % — ABNORMAL HIGH (ref 11.5–15.5)
WBC: 14 10*3/uL — ABNORMAL HIGH (ref 4.0–10.5)

## 2016-12-13 LAB — GLUCOSE, CAPILLARY
Glucose-Capillary: 100 mg/dL — ABNORMAL HIGH (ref 65–99)
Glucose-Capillary: 102 mg/dL — ABNORMAL HIGH (ref 65–99)
Glucose-Capillary: 116 mg/dL — ABNORMAL HIGH (ref 65–99)
Glucose-Capillary: 132 mg/dL — ABNORMAL HIGH (ref 65–99)
Glucose-Capillary: 142 mg/dL — ABNORMAL HIGH (ref 65–99)
Glucose-Capillary: 94 mg/dL (ref 65–99)

## 2016-12-13 LAB — BASIC METABOLIC PANEL
Anion gap: 9 (ref 5–15)
BUN: 26 mg/dL — ABNORMAL HIGH (ref 6–20)
CO2: 25 mmol/L (ref 22–32)
Calcium: 10.4 mg/dL — ABNORMAL HIGH (ref 8.9–10.3)
Chloride: 112 mmol/L — ABNORMAL HIGH (ref 101–111)
Creatinine, Ser: 1.31 mg/dL — ABNORMAL HIGH (ref 0.61–1.24)
GFR calc Af Amer: >60 mL/min (ref >60)
GFR calc non Af Amer: 55 mL/min — ABNORMAL LOW (ref >60)
Glucose, Bld: 123 mg/dL — ABNORMAL HIGH (ref 65–99)
Potassium: 3.7 mmol/L (ref 3.5–5.1)
Sodium: 146 mmol/L — ABNORMAL HIGH (ref 135–145)

## 2016-12-13 MED ORDER — AMOXICILLIN-POT CLAVULANATE 250-62.5 MG/5ML PO SUSR
500.0000 mg | Freq: Two times a day (BID) | ORAL | Status: DC
  Administered 2016-12-13 – 2016-12-17 (×9): 500 mg
  Filled 2016-12-13 (×10): qty 10

## 2016-12-13 NOTE — Care Management Note (Addendum)
Case Management Note  Patient Details  Name: Scott Rivera MRN: [id number] Date of Birth: 10-18-1950  Subjective/Objective:    From [facility] SNF, presents with Chronic Trach/peg, Sepsis, Probable recurrent Asp Pna, traumatic left ptx, has lots of secretions, on 98% trach collar with nitrogen washout.    8/31 [place], BSN-  Patient is on 40% trach collar now and for possible weekend DC, CSW following.               Action/Plan: NCM will follow along with CSW for dc needs.   Expected Discharge Date:  12/16/16               Expected Discharge Plan:  Skilled Nursing Facility  In-House Referral:  Clinical Social Work  Discharge planning Services  CM Consult  Post Acute Care Choice:    Choice offered to:     DME Arranged:    DME Agency:     HH Arranged:    [place] Agency:     Status of Service:  Completed, signed off  If discussed at [employer], dates discussed:    Additional Comments:  Zenon Mayo, RN 12/13/2016, 3:20 PM

## 2016-12-13 NOTE — Evaluation (Signed)
Occupational Therapy Evaluation and Defer to SNF Patient Details Name: Scott Rivera MRN: [id number] DOB: 1951/04/05 Today's Date: 12/13/2016    History of Present Illness Scott Rivera is a 66 y.o. 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 He now presents with hypoxemia fever leukocytosis and what looks like tube feeds drainage from his trach indicative of severe sepsis with aspiration pneumonia   Clinical Impression   Pt known to service from recent hospitalization. Pt is currently total A for ADL and max A +2 for bed mobility. Pt will require continued service at SNF level to maximize safety and independence in ADL and also to work with caregiver in education.     Follow Up Recommendations  SNF;Supervision/Assistance - 24 hour    Equipment Recommendations  Other (comment) (defer to next venue)    Recommendations for Other Services       Precautions / Restrictions Precautions Precautions: Fall Precaution Comments: At risk for skin breakdown, trach, PEG Required Braces or Orthoses: Other Brace/Splint Other Brace/Splint: bil PRAFOs Restrictions Weight Bearing Restrictions: No      Mobility Bed Mobility Overal bed mobility: Needs Assistance Bed Mobility: Supine to Sit;Sit to Supine     Supine to sit: +2 for physical assistance;Max assist Sit to supine: +2 for physical assistance;Max assist   General bed mobility comments: Pt required max A for all bed mobility, Pt not following commands for sequencing  Transfers                 General transfer comment: no transfer attempted at this time    Balance Overall balance assessment: Needs assistance Sitting-balance support: Bilateral upper extremity supported;Feet supported Sitting balance-[place]  Scale: Poor Sitting balance - Comments: once positioned, very briefly able to sit EOB with min A (approx 5 sec) Postural control: Left lateral lean;Posterior lean                                 ADL either performed or assessed with clinical judgement   ADL Overall ADL's : Needs assistance/impaired Eating/Feeding: NPO                                     General ADL Comments: Pt total A for all ADL at this time.      Vision Baseline Vision/History: No visual deficits Patient Visual Report: No change from baseline       Perception     Praxis      Pertinent Vitals/Pain Pain Assessment: Faces Faces Pain Scale: Hurts even more Pain Location: generalized with movement of extremities Pain Descriptors / Indicators: Grimacing;Guarding Pain Intervention(s): Monitored during session;Repositioned     Hand Dominance Right   Extremity/Trunk Assessment Upper Extremity Assessment Upper Extremity Assessment: RUE deficits/detail;LUE deficits/detail RUE Deficits / Details: actively resisting MMT at shoulder and elbow, no spontaneous digit movement in hand - slight thumb opposition noted RUE: Unable to fully assess due to pain RUE Coordination: decreased fine motor;decreased gross motor LUE Deficits / Details: resistant to movement in LUE due to pain, no noted digit movement and pain with PROM LUE: Unable to fully assess due to pain LUE Coordination: decreased fine motor;decreased gross motor   Lower Extremity  Assessment Lower Extremity Assessment: Defer to PT evaluation   Cervical / Trunk Assessment Cervical / Trunk Assessment: Other exceptions Cervical / Trunk Exceptions: very poor trunk control   Communication Communication Communication: Tracheostomy   Cognition Arousal/Alertness: Awake/alert Behavior During Therapy: Flat affect Overall Cognitive Status: Difficult to assess                                 General Comments: Pt not  following commands during session, communication limited during session   General Comments       Exercises Exercises: Other exercises Other Exercises Other Exercises: P/AA Bil hands   Shoulder Instructions      Home Living Family/patient expects to be discharged to:: Skilled nursing facility Living Arrangements: Other relatives (sister)                                      Prior Functioning/Environment Level of Independence: Needs assistance  Gait / Transfers Assistance Needed: +2 from SNF ADL's / Homemaking Assistance Needed: dependent            OT Problem List: Decreased strength;Decreased range of motion;Decreased activity tolerance;Impaired vision/perception;Impaired balance (sitting and/or standing);Decreased coordination;Decreased cognition;Decreased safety awareness;Decreased knowledge of use of DME or AE;Impaired sensation;Impaired UE functional use;Cardiopulmonary status limiting activity;Impaired tone;Pain      OT Treatment/Interventions:      OT Goals(Current goals can be found in the care plan section) Acute Rehab OT Goals Patient Stated Goal: none stated today OT Goal Formulation: Patient unable to participate in goal setting  OT Frequency:     Barriers to D/C:            Co-evaluation PT/OT/SLP Co-Evaluation/Treatment: Yes Reason for Co-Treatment: Complexity of the patient's impairments (multi-system involvement);For patient/therapist safety;To address functional/ADL transfers   OT goals addressed during session: Strengthening/ROM      AM-PAC PT "6 Clicks" Daily Activity     Outcome Measure Help from another person eating meals?: Total Help from another person taking care of personal grooming?: Total Help from another person toileting, which includes using toliet, bedpan, or urinal?: Total Help from another person bathing (including washing, rinsing, drying)?: Total Help from another person to put on and taking off regular upper  body clothing?: Total Help from another person to put on and taking off regular lower body clothing?: Total 6 Click Score: 6   End of Session Equipment Utilized During Treatment: Oxygen  Activity Tolerance: Patient tolerated treatment well Patient left: in bed;with call bell/phone within reach;with bed alarm set  OT Visit Diagnosis: Muscle weakness (generalized) (M62.81);Adult, failure to thrive (R62.7);Ataxia, unspecified (R27.0);Other symptoms and signs involving the nervous system (R29.898);Other symptoms and signs involving cognitive function Symptoms and signs involving cognitive functions: Cerebral infarction                Time: 0926-0950 OT Time Calculation (min): 24 min Charges:  OT General Charges $OT Visit: 1 Visit OT Evaluation $OT Eval High Complexity: 1 High G-Codes:     Hulda Humphrey OTR/L [place] 12/13/2016, 10:12 AM

## 2016-12-13 NOTE — Care Management Important Message (Signed)
Important Message  Patient Details  Name: Scott Rivera MRN: [id number] Date of Birth: 11/15/1950   Medicare Important Message Given:  Yes    Nathen May 12/13/2016, 8:50 AM

## 2016-12-13 NOTE — Progress Notes (Signed)
PROGRESS NOTE    [name]  XQJ:[id number] DOB: 1950/05/31 DOA: 12/09/2016 PCP: Patient, No Pcp Per    Brief Narrative: Scott Rivera is a 66 y.o. 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  He was admitted for sepsis due to aspiration pneumonia. IV fluids and vancomycin/cefepime were given for sepsis. Lactic acid normalized. Hgb dropped 9.2 > 7.8 with IV fluids, so 2u PRBCs given with improvement to 9.9. Troponin was mildly elevated but with downward trend 0.17 > 0.03. Cr has improved from 2.47 on admission. Zosyn has been continued for aspiration pneumonia and tube feeds were titrated back to 50cc/hr on 8/27.   Assessment & Plan:   Principal Problem:   Severe sepsis with acute organ dysfunction (HCC) Active Problems:   Essential hypertension   Dysphagia, post-stroke   Tobacco abuse   Tachypnea   Hyperglycemia   Hypernatremia   Leukocytosis (leucocytosis)   Aspiration pneumonia (HCC)   Goals of care, counseling/discussion   Hypotension   Acute-on-chronic kidney injury ([place])   Elevated troponin   Anemia due to chronic kidney disease   Compensated metabolic alkalosis   Pneumothorax, closed, traumatic, initial encounter   Tracheostomy status ([place])   Pneumothorax   Hypoxemia   Sepsis sec to aspiration pneumonitis:  Improving.  Currently on zosyn transition to augmentin via tube to complete the course.  Continue with trach care and pulmonologist assisting with care.     Acute on chronic respiratory failure:  Trach dependent.    Acute on CKD stage 3.  Currently at baseline.    Anemia of chronic disease with a component of acute illness anemia:  S/p 2 units of prbc transfusion and hemoglobin around 8. Monitor intermittently.  No signs of active bleeding.    Elevated troponin: Due to demand ischemia in setting of aspiration event/sepsis. Downward trend, no acute changes on ECG.  - No further intervention planned at this time  Traumatic pneumothorax: On CXR 8/26. No mediastinal shift. Stable on 8/28.  - Pulmonology recommends increasing FiO2 for nitrogen washout.   H/o recent CVA:  Continue with aspirin and plavix.  slp eval and follow up    Severe protein energy malnutrition:  Currently on tube feeds and dietary on board.    Hypertension:  Prn labetalol.    Chronic leukocytosis:  Recommended outpatient follow up with a hematologist if it doesnt improve in 2 weeks.        DVT prophylaxis: (Lovenox Code Status: full code.  Family Communication: none at bedside.  Disposition Plan: pending further eval.  Possibly SNF WHEN bed available.    Consultants:   PCCM.   PALLIATIVE CARE.   Procedures:Left pneumothorax needle decompression 8/25 Antimicrobials:   Vancomycin 8/25 >> 8/28  Cefepime 8/25  Zosyn 8/26 >>    Subjective: NON VERBAL.  Alert and appears comfortable.   Objective: Vitals:   12/13/16 0700 12/13/16 0746 12/13/16 0932 12/13/16 0959  BP: 102/63 119/77  111/73  Pulse: 81 92 96   Resp: (!) 6 (!) 26 (!) 22   Temp:  97.9 F (36.6 C)    TempSrc:  Oral    SpO2: 100% 99% 97%   Weight:      Height:        Intake/Output Summary (Last 24 hours) at 12/13/16 1024 Last data filed at 12/13/16 0700  Gross per 24 hour  Intake             2653 ml  Output              700 ml  Net             1953 ml   Filed Weights   12/11/16 0500 12/12/16 0439 12/13/16 0408  Weight: 65.7 kg (144 lb 13.5 oz) 65 kg (143 lb 4.8 oz) 65.4 kg (144 lb 2.9 oz)    Examination:  General exam: Appears calm and comfortable non verbal. Neck:  Trach in place.  Respiratory system: diminished at bases. No wheezing. Scattered rhonchi.  Cardiovascular system: S1 & S2 heard, RRR. No JVD, murmurs, rubs, gallops or clicks. No  pedal edema. Gastrointestinal system: Abdomen is nondistended, soft and nontender. No organomegaly or masses felt. Normal bowel sounds heard. Central nervous system: alert and comfortable. Able tomove extremities.  Extremities: Symmetric 5 x 5 power. Skin: No rashes, lesions or ulcers Psychiatry: couldn't be assessed due to his baseline diminished mental acuity.    Data Reviewed: I have personally reviewed following labs and imaging studies  CBC:  Recent Labs Lab 12/09/16 0350 12/09/16 0614 12/10/16 0323 12/11/16 0359 12/12/16 0721 12/13/16 0601  WBC 27.6*  --  20.2* 16.1* 14.9* 14.0*  NEUTROABS 22.3*  --   --   --   --   --   HGB 9.2* 7.8* 9.9* 7.8* 7.5* 8.1*  HCT 30.7* 23.0* 32.8* 25.3* 25.1* 26.6*  MCV 94.5  --  93.4 93.0 93.3 94.0  PLT 836*  --  651* 540* 511* 295*   Basic Metabolic Panel:  Recent Labs Lab 12/10/16 0323 12/10/16 1544 12/11/16 0359 12/12/16 0721 12/13/16 0601  NA 157* 153* 151* 148* 146*  K 4.1 4.3 3.9 3.7 3.7  CL 124* 122* 121* 117* 112*  CO2 23 20* 21* 23 25  GLUCOSE 67 55* 98 138* 123*  BUN 38* 34* 28* 24* 26*  CREATININE 1.77* 1.80* 1.82* 1.49* 1.31*  CALCIUM 11.0* 11.1* 10.5* 10.3 10.4*   GFR: Estimated Creatinine Clearance: 51.3 mL/min (A) (by C-G formula based on SCr of 1.31 mg/dL (H)). Liver Function Tests:  Recent Labs Lab 12/09/16 0350 12/10/16 0323  AST 62* 30  ALT 75* 55  ALKPHOS 230* 192*  BILITOT 1.1 0.6  PROT 9.5* 8.4*  ALBUMIN 2.2* 2.0*   No results for input(s): LIPASE, AMYLASE in the last 168 hours. No results for input(s): AMMONIA in the last 168 hours. Coagulation Profile: No results for input(s): INR, PROTIME in the last 168 hours. Cardiac Enzymes:  Recent Labs Lab 12/09/16 0350 12/09/16 1419 12/10/16 0803 12/10/16 1315  TROPONINI 0.03* 0.17* 0.03* 0.04*   BNP (last 3 results) No results for input(s): PROBNP in the last 8760 hours. HbA1C: No results for input(s): HGBA1C in the last 72  hours. CBG:  Recent Labs Lab 12/12/16 1237 12/12/16 1651 12/12/16 1935 12/12/16 2358 12/13/16 0403  GLUCAP 107* 112* 82 102* 116*   Lipid Profile: No results for input(s): CHOL, HDL, LDLCALC, TRIG, CHOLHDL, LDLDIRECT in the last 72 hours. Thyroid Function Tests: No results for input(s): TSH, T4TOTAL, FREET4, T3FREE, THYROIDAB in the last 72 hours. Anemia Panel: No results for input(s): VITAMINB12, FOLATE, FERRITIN, TIBC, IRON, RETICCTPCT in the last 72 hours. Sepsis Labs:  Recent Labs Lab 12/09/16 0405 12/09/16 0638 12/09/16 1419  PROCALCITON  --   --  1.23  LATICACIDVEN 2.94* 1.88  --     Recent Results (from  the past 240 hour(s))  Blood Culture (routine x 2)     Status: None (Preliminary result)   Collection Time: 12/09/16  3:52 AM  Result Value Ref Range Status   Specimen Description BLOOD RIGHT FOREARM  Final   Special Requests IN PEDIATRIC BOTTLE Blood Culture adequate volume  Final   Culture NO GROWTH 4 DAYS  Final   Report Status PENDING  Incomplete  Blood Culture (routine x 2)     Status: None (Preliminary result)   Collection Time: 12/09/16  4:00 AM  Result Value Ref Range Status   Specimen Description BLOOD LEFT HAND  Final   Special Requests IN PEDIATRIC BOTTLE Blood Culture adequate volume  Final   Culture NO GROWTH 4 DAYS  Final   Report Status PENDING  Incomplete  MRSA PCR Screening     Status: None   Collection Time: 12/09/16 12:00 PM  Result Value Ref Range Status   MRSA by PCR NEGATIVE NEGATIVE Final    Comment:        The GeneXpert MRSA Assay (FDA approved for NASAL specimens only), is one component of a comprehensive MRSA colonization surveillance program. It is not intended to diagnose MRSA infection nor to guide or monitor treatment for MRSA infections.   Culture, respiratory (NON-Expectorated)     Status: None   Collection Time: 12/09/16 12:30 PM  Result Value Ref Range Status   Specimen Description TRACHEAL ASPIRATE  Final    Special Requests NONE  Final   Gram Stain   Final    ABUNDANT WBC PRESENT, PREDOMINANTLY PMN FEW SQUAMOUS EPITHELIAL CELLS PRESENT FEW GRAM POSITIVE RODS RARE SQUAMOUS EPITHELIAL CELLS PRESENT RARE GRAM POSITIVE COCCI IN PAIRS    Culture   Final    MODERATE STAPHYLOCOCCUS AUREUS WITHIN MIXED CULTURE    Report Status 12/12/2016 FINAL  Final   Organism ID, Bacteria STAPHYLOCOCCUS AUREUS  Final      Susceptibility   Staphylococcus aureus - MIC*    CIPROFLOXACIN <=0.5 SENSITIVE Sensitive     ERYTHROMYCIN <=0.25 SENSITIVE Sensitive     GENTAMICIN <=0.5 SENSITIVE Sensitive     OXACILLIN <=0.25 SENSITIVE Sensitive     TETRACYCLINE 8 INTERMEDIATE Intermediate     VANCOMYCIN <=0.5 SENSITIVE Sensitive     TRIMETH/SULFA <=10 SENSITIVE Sensitive     CLINDAMYCIN <=0.25 SENSITIVE Sensitive     RIFAMPIN <=0.5 SENSITIVE Sensitive     Inducible Clindamycin NEGATIVE Sensitive     * MODERATE STAPHYLOCOCCUS AUREUS         Radiology Studies: Dg Chest Port 1 View  Result Date: 12/13/2016 CLINICAL DATA:  Tracheostomy patient. Follow-up left apical pneumothorax. EXAM: PORTABLE CHEST 1 VIEW COMPARISON:  Portable chest x-ray of December 12, 2016 FINDINGS: There is a 5% or less apical pneumothorax on the left which appears stable. 3 surgical clips project over the left apex and also are unchanged in position. There is no pleural effusion. There is patchy density in the retrocardiac region on the left and in the right infrahilar region. No right-sided pneumothorax or pleural effusion is observed. The heart and pulmonary vascularity are normal. The tracheostomy tube tip projects at the inferior margin of the clavicular heads. IMPRESSION: Stable 5% or less left apical pneumothorax. Persistent bibasilar atelectasis or pneumonia. Electronically Signed   By: David  Martinique M.D.   On: 12/13/2016 07:33   Dg Chest Port 1 View  Result Date: 12/12/2016 CLINICAL DATA:  Check tracheostomy placement EXAM: PORTABLE CHEST  1 VIEW COMPARISON:  12/11/2016 FINDINGS: Tracheostomy tube  is again noted in satisfactory position. Cardiac shadow is stable. Left pneumothorax is again identified and stable. Patchy right basilar infiltrate is noted. No acute bony abnormality is seen. Chronic changes in the shoulder joints are noted. IMPRESSION: Stable left apical pneumothorax. Mild patchy changes in the right base. Electronically Signed   By: Inez Catalina M.D.   On: 12/12/2016 07:36   Dg Chest Port 1 View  Result Date: 12/11/2016 CLINICAL DATA:  Pneumothorax. EXAM: PORTABLE CHEST 1 VIEW COMPARISON:  Chest x-ray 12/10/2016.  Chest x-ray 09/03/2016 FINDINGS: Tracheostomy tube noted in stable position. Cardiomegaly with pulmonary vascular prominence and bilateral interstitial prominence consistent with CHF. Findings have worsened slightly from prior exam . Pneumonitis cannot be excluded. No prominent pleural effusion. Small left pneumothorax is again noted without significant change. IMPRESSION: 1. Tracheostomy tube in stable position. 2.  Small left-sided pneumothorax noted without significant change. 3. Cardiomegaly with pulmonary vascular prominence and interstitial prominence suggesting CHF. Pneumonitis cannot be excluded. These findings have worsened slightly from prior exam. Electronically Signed   By: [place]   On: 12/11/2016 13:05        Scheduled Meds: . aspirin  325 mg Per Tube Daily  . chlorhexidine gluconate (MEDLINE KIT)  15 mL Mouth Rinse BID  . cloNIDine  0.1 mg Per Tube BID  . clopidogrel  75 mg Per Tube Daily  . enoxaparin (LOVENOX) injection  40 mg Subcutaneous Q24H  . famotidine  20 mg Per Tube BID  . feeding supplement (PRO-STAT SUGAR FREE 64)  30 mL Oral BID  . free water  200 mL Per Tube Q4H  . mouth rinse  15 mL Mouth Rinse QID  . metoCLOPramide  10 mg Per Tube TID AC & HS  . metoprolol tartrate  25 mg Per Tube BID  . pravastatin  20 mg Per Tube q1800  . senna  1 tablet Per Tube BID  . sodium  chloride flush  3 mL Intravenous Q12H   Continuous Infusions: . feeding supplement (JEVITY 1.5 CAL/FIBER) 1,000 mL (12/11/16 2230)  . piperacillin-tazobactam (ZOSYN)  IV Stopped (12/13/16 1002)     LOS: 4 days    Time spent: 35 minutes.     Hosie Poisson, MD [facility] Pager [phone] If 7PM-7AM, please contact night-coverage [email or web address] Password [email or web address] 12/13/2016, 10:24 AM

## 2016-12-13 NOTE — Progress Notes (Addendum)
Physical Therapy Evaluation Patient Details Name: Scott Rivera MRN: [id number] DOB: Feb 08, 1951 Today's Date: 12/13/2016   History of Present Illness  Scott Rivera is a 66 y.o. 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 He now presents with hypoxemia fever leukocytosis and what looks like tube feeds drainage from his trach indicative of severe sepsis with aspiration pneumonia  Clinical Impression  Orders received for PT evaluation. Patient demonstrates deficits in functional mobility as indicated below. Will benefit from continued skilled PT to address deficits and maximize function. Will see as indicated and progress as tolerated. Recommend SNF upon discharge.     Follow Up Recommendations SNF;Supervision/Assistance - 24 hour    Equipment Recommendations  Hospital bed;Wheelchair (measurements PT);Wheelchair cushion (measurements PT);Other (comment) (hoyler lift)    Recommendations for Other Services       Precautions / Restrictions Precautions Precautions: Fall Precaution Comments: At risk for skin breakdown, trach, PEG Required Braces or Orthoses: Other Brace/Splint Other Brace/Splint: bil PRAFOs Restrictions Weight Bearing Restrictions: No      Mobility  Bed Mobility Overal bed mobility: Needs Assistance Bed Mobility: Supine to Sit;Sit to Supine     Supine to sit: +2 for physical assistance;Max assist Sit to supine: +2 for physical assistance;Max assist   General bed mobility comments: Pt required max A for all bed mobility, Pt not following commands for sequencing  Transfers                 General transfer comment: no transfer attempted at this time  Ambulation/Gait                Stairs            Wheelchair Mobility     Modified Rankin (Stroke Patients Only) Modified Rankin (Stroke Patients Only) Pre-Morbid Rankin Score: No symptoms Modified Rankin: Severe disability     Balance Overall balance assessment: Needs assistance Sitting-balance support: Bilateral upper extremity supported;Feet supported Sitting balance-Leahy Scale: Poor Sitting balance - Comments: once positioned, very briefly able to sit EOB with min A (approx 5 sec) Postural control: Left lateral lean;Posterior lean                                   Pertinent Vitals/Pain Pain Assessment: Faces Faces Pain Scale: Hurts even more Pain Location: generalized with movement of extremities Pain Descriptors / Indicators: Grimacing;Guarding Pain Intervention(s): Monitored during session    Home Living                        Prior Function                 Hand Dominance        Extremity/Trunk Assessment        Lower Extremity Assessment RLE Deficits / Details: very limited AROM knee flexion, hip abduction, hip flexion.  RLE: Unable to fully assess due to pain RLE Sensation: decreased light touch (pt nodding no to being able to feel touch posterior knee) LLE Deficits / Details: very limited AROM knee flexion, hip abduction, hip flexion. Able to wiggle big toe. LLE: Unable to fully assess due to pain LLE Sensation: decreased light touch (pt nodding no to being able to feel touch posterior knee)  Communication      Cognition Arousal/Alertness: Awake/alert Behavior During Therapy: Flat affect Overall Cognitive Status: Difficult to assess                                 General Comments: Pt not following commands during session, communication limited during session      General Comments      Exercises Other Exercises Other Exercises:  (Passive ROM BLEs as tolerated, limited fixed deformities)   Assessment/Plan    PT Assessment Patient needs continued PT services  PT  Problem List Decreased strength;Decreased range of motion;Decreased activity tolerance;Decreased balance;Decreased mobility;Decreased coordination;Pain;Impaired sensation;Decreased cognition;Cardiopulmonary status limiting activity       PT Treatment Interventions DME instruction;Functional mobility training;Therapeutic activities;Therapeutic exercise;Balance training;Neuromuscular re-education;Cognitive remediation;Patient/family education;Wheelchair mobility training;Gait training    PT Goals (Current goals can be found in the Care Plan section)  Acute Rehab PT Goals Patient Stated Goal: none stated today PT Goal Formulation: Patient unable to participate in goal setting Time For Goal Achievement: 12/27/16 Potential to Achieve Goals: Poor    Frequency Min 2X/week   Barriers to discharge Decreased caregiver support      Co-evaluation PT/OT/SLP Co-Evaluation/Treatment: Yes             AM-PAC PT "6 Clicks" Daily Activity  Outcome Measure Difficulty turning over in bed (including adjusting bedclothes, sheets and blankets)?: Unable Difficulty moving from lying on back to sitting on the side of the bed? : Unable Difficulty sitting down on and standing up from a chair with arms (e.g., wheelchair, bedside commode, etc,.)?: Unable Help needed moving to and from a bed to chair (including a wheelchair)?: Total Help needed walking in hospital room?: Total Help needed climbing 3-5 steps with a railing? : Total 6 Click Score: 6    End of Session Equipment Utilized During Treatment: Oxygen (28% FiO2 ATC) Activity Tolerance: Patient limited by fatigue Patient left: with call bell/phone within reach;with bed alarm set;in bed Nurse Communication: Mobility status;Need for lift equipment PT Visit Diagnosis: Other abnormalities of gait and mobility (R26.89);Hemiplegia and hemiparesis;Other symptoms and signs involving the nervous system (R29.898);Muscle weakness (generalized)  (M62.81);Pain Hemiplegia - Right/Left: Left Hemiplegia - dominant/non-dominant: Non-dominant Hemiplegia - caused by: Cerebral infarction Pain - part of body: Leg    Time: 0926-0950 PT Time Calculation (min) (ACUTE ONLY): 24 min   Charges:   PT Evaluation $PT Eval Moderate Complexity: 1 Mod     PT G Codes:        Alben Deeds, PT DPT  Board Certified Neurologic Specialist [place] 12/13/2016, 1:19 PM

## 2016-12-13 NOTE — Progress Notes (Signed)
Palliative Care  I stopped by to check-in on Scott Rivera. He appeared comfortable with no obvious uncontrolled symptoms. No family at bedside. I had been following and talking with his sister, Scott Rivera. She has expressed clear goals of care and code status: Full scope aggressive care and Full code. She was appreciative of my visits. At this point there is nothing more to add from the Palliative standpoint. I expect he will return to the hospital for ongoing acute care needs, at which point my team is happy to re-engage and continue conversations. Please call us at [phone] with any questions, concerns, or issues during this hospitalization.    Charlynn Court, AGNP-C Palliative Care [phone]  No charge note.

## 2016-12-13 NOTE — Plan of Care (Signed)
Problem: Physical Regulation: Goal: Ability to maintain clinical measurements within normal limits will improve Outcome: Progressing Patient requiring more frequent suctioning than previous night. Suctioned patient 3 times and he also coughed up secretions independently.  Goal: Will remain free from infection Outcome: Progressing Patient receiving antibiotics.   Problem: Skin Integrity: Goal: Risk for impaired skin integrity will decrease Outcome: Progressing Turned every 2 hours and patient skin assessed. Sacrum pink in appearance without drainage.   Problem: Nutrition: Goal: Adequate nutrition will be maintained Outcome: Completed/Met Date Met: 12/13/16 Patient on tube feedings.   Problem: Bowel/Gastric: Goal: Will not experience complications related to bowel motility Outcome: Progressing Patient having small smears of bowel movement.

## 2016-12-13 NOTE — Progress Notes (Signed)
PULMONARY / CRITICAL CARE MEDICINE   Name: Scott Rivera MRN: [id number] DOB: [birth year]    ADMISSION DATE:  12/09/2016 CONSULTATION DATE:  12/09/2016  REFERRING MD:  Dr. Evangeline Gula  CHIEF COMPLAINT:  Evaluation of probable aspiration pneumonia and impending sepsis/  Traumatic Left PTX   HISTORY OF PRESENT ILLNESS:   [name] is a 66 year old male with history of hypertension, CVA, recurrent aspiration requiring tracheostomy and G-tube, who lives in a nursing home and presented to the emergency department after EMS was called out for the patient being found unresponsive. He was previously admitted 10/19/2016 with bi-hemispheric and cerebellar infarcts and sepsis secondary to aspiration pneumonia, with a requirement for mechanical ventilation. Due to recurrent aspirations he had a tracheostomy placed as well as a PEG for feedings. 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 He was discharged to a SNF. Reportedly he was found with a GCS of 3 by nursing home staff and completely unresponsive. During transport , EMS reported O2 sats in the 80s. Upon arrival he was tachycardic and febrile to 101.1 F but not hypotensive. His BP has was transiently labile, falling to as low as 94/53; he was treated for possible L PTX with catheter that was pulled and he has received 5500 cc thus far. With a leukocytosis of 27.6K and PCXR showing a RLL infiltrate, he has been empirically started on Vanc + Cefipime. Although he moves all 4 extremities with open eyes, he does not respond to verbal commands or stimuli.  SUBJECTIVE:  No change overnight.  Comfortable on ATC.  Tiny apical ptx stable, <5%   VITAL SIGNS: BP 111/73   Pulse 96   Temp 97.9 F (36.6 C) (Oral)   Resp (!) 22   Ht 5\' 11"  (1.803 m)   Wt 65.4 kg (144 lb 2.9 oz)   SpO2 97%   BMI 20.11 kg/m   HEMODYNAMICS:    VENTILATOR SETTINGS: FiO2 (%):  [98 %] 98 %  INTAKE / OUTPUT:  Intake/Output Summary (Last  24 hours) at 12/13/16 1021 Last data filed at 12/13/16 0700  Gross per 24 hour  Intake             2653 ml  Output              700 ml  Net             1953 ml    PHYSICAL EXAMINATION: General appearance:  Chronically ill appearing 66 Year old  Male cachectic  NAD on ATC in bed  Eyes: anicteric sclerae, moist conjunctivae; PERRL, EOMI bilaterally. Mouth:  membranes and no mucosal ulcerations; normal hard and soft palate Neck: Trachea midline; neck supple, no JVD trach unremarkable. Thick yellow secretions  Lungs/chest: resps even non labored on ATC, few scattered rhonchi, otherwise clear, equal breath sounds  CV: RRR, no MRGs  Abdomen: Soft, non-tender; no masses or HSM Extremities: No peripheral edema or extremity lymphadenopathy Skin: Normal temperature, turgor and texture; no rash, ulcers or subcutaneous nodules Psych: Appropriate affect, alert and oriented to person, place and time   LABS:  BMET  Recent Labs Lab 12/11/16 0359 12/12/16 0721 12/13/16 0601  NA 151* 148* 146*  K 3.9 3.7 3.7  CL 121* 117* 112*  CO2 21* 23 25  BUN 28* 24* 26*  CREATININE 1.82* 1.49* 1.31*  GLUCOSE 98 138* 123*    Electrolytes  Recent Labs Lab 12/11/16 0359 12/12/16 0721 12/13/16 0601  CALCIUM 10.5* 10.3 10.4*  CBC  Recent Labs Lab 12/11/16 0359 12/12/16 0721 12/13/16 0601  WBC 16.1* 14.9* 14.0*  HGB 7.8* 7.5* 8.1*  HCT 25.3* 25.1* 26.6*  PLT 540* 511* 475*    Coag's No results for input(s): APTT, INR in the last 168 hours.  Sepsis Markers  Recent Labs Lab 12/09/16 0405 12/09/16 0638 12/09/16 1419  LATICACIDVEN 2.94* 1.88  --   PROCALCITON  --   --  1.23    ABG  Recent Labs Lab 12/09/16 1215 12/10/16 0620 12/11/16 0410  PHART 7.411 7.423 7.447  PCO2ART 38.4 35.4 32.4  PO2ART 64.2* 75.5* 107    Liver Enzymes  Recent Labs Lab 12/09/16 0350 12/10/16 0323  AST 62* 30  ALT 75* 55  ALKPHOS 230* 192*  BILITOT 1.1 0.6  ALBUMIN 2.2* 2.0*     Cardiac Enzymes  Recent Labs Lab 12/09/16 1419 12/10/16 0803 12/10/16 1315  TROPONINI 0.17* 0.03* 0.04*    Glucose  Recent Labs Lab 12/12/16 0742 12/12/16 1237 12/12/16 1651 12/12/16 1935 12/12/16 2358 12/13/16 0403  GLUCAP 116* 107* 112* 82 102* 116*    Imaging   STUDIES:  ECG: No acute ST-T changes    CULTURES: MRSA screen  8/25 > neg  trach asp 8/25 >  SA in mixed culture BC x 2  8/25 >>>  ANTIBIOTICS: zosyn  8/25 >>>  vanc 8/25>>>8/28   ASSESSMENT / PLAN:  probable recurrent asp pna  = HCAP (MSSA) w/in mixed culture  Chronic trach  Plan Complete 8d abx  ATC as tol   Traumatic L PTX, no resp distress -improved in size. Remains stable, <5% Plan F/u am cxr Cont supplemental oxygen w/ 100% ATC  Fluid and electrolyte imbalance Plan Per IM service  Hyperglycemia; no prior hx  Plan ssi   S/p bi-hemispheric and cerebellar CVA with altered MS Plan Supportive care     Nickolas Madrid, NP 12/13/2016  10:21 AM Pager: [phone] or [phone]

## 2016-12-14 ENCOUNTER — Inpatient Hospital Stay (HOSPITAL_COMMUNITY): Payer: Medicare HMO

## 2016-12-14 LAB — CBC
HCT: 27.2 % — ABNORMAL LOW (ref 39.0–52.0)
Hemoglobin: 8.1 g/dL — ABNORMAL LOW (ref 13.0–17.0)
MCH: 27.9 pg (ref 26.0–34.0)
MCHC: 29.8 g/dL — ABNORMAL LOW (ref 30.0–36.0)
MCV: 93.8 fL (ref 78.0–100.0)
Platelets: 496 10*3/uL — ABNORMAL HIGH (ref 150–400)
RBC: 2.9 MIL/uL — ABNORMAL LOW (ref 4.22–5.81)
RDW: 17 % — ABNORMAL HIGH (ref 11.5–15.5)
WBC: 14.7 10*3/uL — ABNORMAL HIGH (ref 4.0–10.5)

## 2016-12-14 LAB — GLUCOSE, CAPILLARY
Glucose-Capillary: 111 mg/dL — ABNORMAL HIGH (ref 65–99)
Glucose-Capillary: 112 mg/dL — ABNORMAL HIGH (ref 65–99)
Glucose-Capillary: 117 mg/dL — ABNORMAL HIGH (ref 65–99)
Glucose-Capillary: 128 mg/dL — ABNORMAL HIGH (ref 65–99)
Glucose-Capillary: 157 mg/dL — ABNORMAL HIGH (ref 65–99)
Glucose-Capillary: 95 mg/dL (ref 65–99)

## 2016-12-14 LAB — CULTURE, BLOOD (ROUTINE X 2)
Culture: NO GROWTH
Culture: NO GROWTH
Special Requests: ADEQUATE
Special Requests: ADEQUATE

## 2016-12-14 LAB — BASIC METABOLIC PANEL
Anion gap: 7 (ref 5–15)
BUN: 24 mg/dL — ABNORMAL HIGH (ref 6–20)
CO2: 26 mmol/L (ref 22–32)
Calcium: 10.5 mg/dL — ABNORMAL HIGH (ref 8.9–10.3)
Chloride: 112 mmol/L — ABNORMAL HIGH (ref 101–111)
Creatinine, Ser: 1.07 mg/dL (ref 0.61–1.24)
GFR calc Af Amer: >60 mL/min (ref >60)
GFR calc non Af Amer: >60 mL/min (ref >60)
Glucose, Bld: 103 mg/dL — ABNORMAL HIGH (ref 65–99)
Potassium: 3.9 mmol/L (ref 3.5–5.1)
Sodium: 145 mmol/L (ref 135–145)

## 2016-12-14 MED ORDER — SENNA 8.6 MG PO TABS: 1.0000 | ORAL_TABLET | Freq: Two times a day (BID) | ORAL | 0 refills | Status: DC

## 2016-12-14 MED ORDER — ALBUTEROL SULFATE (2.5 MG/3ML) 0.083% IN NEBU: 2.5000 mg | INHALATION_SOLUTION | RESPIRATORY_TRACT | Status: DC | PRN

## 2016-12-14 MED ORDER — TRAMADOL HCL 50 MG PO TABS: 50.0000 mg | ORAL_TABLET | Freq: Two times a day (BID) | ORAL | 0 refills | Status: DC | PRN

## 2016-12-14 MED ORDER — AMOXICILLIN-POT CLAVULANATE 250-62.5 MG/5ML PO SUSR: 500.0000 mg | Freq: Two times a day (BID) | ORAL | 0 refills | Status: AC

## 2016-12-14 MED ORDER — JEVITY 1.5 CAL/FIBER PO LIQD: 1000.0000 mL | ORAL | Status: DC

## 2016-12-14 NOTE — Progress Notes (Signed)
RN called me and asked me to come check on patient, had to increase oxygen to 60% ATC. Patient very tachypnea, cleaned out inner cannula and suctioned patient. RT will continue to monitor.

## 2016-12-14 NOTE — Progress Notes (Signed)
CSW continuing to follow for return to [facility] when stable  Pt currently at 98% trach collar- will need to be stable on 28% trach collar for at least 24 hours prior to return to SNF  Pt also on mitts- will need to be discontinuing for 24 hours prior to DC  CSW informed MD of these barriers  Jorge Ny, [place] Social Worker [phone]

## 2016-12-14 NOTE — Progress Notes (Signed)
PROGRESS NOTE    Scott Rivera  QJJ:[id number] DOB: May 13, 1950 DOA: 12/09/2016 PCP: Patient, No Pcp Per    Brief Narrative: Scott Rivera is a 66 y.o. 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  He was admitted for sepsis due to aspiration pneumonia. IV fluids and vancomycin/cefepime were given for sepsis. Lactic acid normalized. Hgb dropped 9.2 > 7.8 with IV fluids, so 2u PRBCs given with improvement to 9.9. Troponin was mildly elevated but with downward trend 0.17 > 0.03. Cr has improved from 2.47 on admission. Zosyn has been continued for aspiration pneumonia and tube feeds were titrated back to 50cc/hr on 8/27. Patient is on 98% trach collar, CSW informed us that patient's oxygen has to be less than 28% to be discharged to SNF.  Assessment & Plan:   Principal Problem:   Severe sepsis with acute organ dysfunction (HCC) Active Problems:   Essential hypertension   Dysphagia, post-stroke   Tobacco abuse   Tachypnea   Hyperglycemia   Hypernatremia   Leukocytosis (leucocytosis)   Aspiration pneumonia (HCC)   Goals of care, counseling/discussion   Hypotension   Acute-on-chronic kidney injury ([place])   Elevated troponin   Anemia due to chronic kidney disease   Compensated metabolic alkalosis   Pneumothorax, closed, traumatic, initial encounter   Tracheostomy status ([place])   Pneumothorax   Hypoxemia   Sepsis sec to aspiration pneumonitis:  Improving.  was on zosyn transition to augmentin via tube to complete the course.  Continue with trach care and pulmonologist assisting with care.  Respiratory to assist [place] taper the oxygen to less than 28% int he next few days , so we can discharge him back to SNF.     Acute on chronic respiratory failure:  Trach dependent.    Acute on  CKD stage 3.   creatinine Currently at baseline.    Anemia of chronic disease with a component of acute illness anemia:  S/p 2 units of prbc transfusion and hemoglobin around 8. Monitor intermittently.  No signs of active bleeding.   Elevated troponin: Due to demand ischemia in setting of aspiration event/sepsis. Downward trend, no acute changes on ECG.  - No further intervention planned at this time  Traumatic pneumothorax: On CXR 8/26. No mediastinal shift. Stable on 8/28.  - taper the oxygen to less than 28% in the next few days, as appropriate.   H/o recent CVA:  Continue with aspirin and plavix.  slp eval and follow up    Severe protein energy malnutrition:  Currently on tube feeds and dietary on board.    Hypertension:  Prn labetalol.     Chronic leukocytosis:  Recommended outpatient follow up with a hematologist if it doesnt improve in 2 weeks.    Hypercalcemia:  Get PTH.  Asymptomatic.     DVT prophylaxis: (Lovenox Code Status: full code.  Family Communication: none at bedside.  Disposition Plan: pending further eval.  Possibly SNF when we can wean his oxygen to less than 28%.    Consultants:   PCCM.   PALLIATIVE CARE.   Procedures:Left pneumothorax needle decompression 8/25 Antimicrobials:   Vancomycin 8/25 >> 8/28  Cefepime 8/25  Zosyn 8/26 >>    Subjective: NON VERBAL.  Alert and appears comfortable.  No new complaints.   Objective: Vitals:   12/14/16 1200 12/14/16 1205 12/14/16  1524 12/14/16 1600  BP: 139/71 139/71 137/66 138/75  Pulse: 91 86  94  Resp: (!) 40 (!) 28 17   Temp: 98.3 F (36.8 C)   98.9 F (37.2 C)  TempSrc: Axillary   Axillary  SpO2:  92%    Weight:      Height:        Intake/Output Summary (Last 24 hours) at 12/14/16 1718 Last data filed at 12/14/16 1600  Gross per 24 hour  Intake             2903 ml  Output             1150 ml  Net             1753 ml   Filed Weights   12/12/16 0439 12/13/16 0408  12/14/16 0500  Weight: 65 kg (143 lb 4.8 oz) 65.4 kg (144 lb 2.9 oz) 66.5 kg (146 lb 9.7 oz)    Examination:  General exam: Appears calm and comfortable  Does not respond to verbal cues.  Neck: Trach in place.  Respiratory system: diminished at bases. No wheezing. Scattered rhonchi. Air entry fair.  Cardiovascular system: S1 & S2 heard, RRR. No JVD, murmurs, rubs, gallops or clicks. No pedal edema. Gastrointestinal system: Abdomen is soft NT nd bs+ Central nervous system: alert and comfortable. Able tomove extremities.  Extremities: Symmetric 5 x 5 power. Skin: No rashes, lesions or ulcers Psychiatry: couldn't be assessed due to his baseline diminished mental acuity.    Data Reviewed: I have personally reviewed following labs and imaging studies  CBC:  Recent Labs Lab 12/09/16 0350  12/10/16 0323 12/11/16 0359 12/12/16 0721 12/13/16 0601 12/14/16 0314  WBC 27.6*  --  20.2* 16.1* 14.9* 14.0* 14.7*  NEUTROABS 22.3*  --   --   --   --   --   --   HGB 9.2*  < > 9.9* 7.8* 7.5* 8.1* 8.1*  HCT 30.7*  < > 32.8* 25.3* 25.1* 26.6* 27.2*  MCV 94.5  --  93.4 93.0 93.3 94.0 93.8  PLT 836*  --  651* 540* 511* 475* 496*  < > = values in this interval not displayed. Basic Metabolic Panel:  Recent Labs Lab 12/10/16 1544 12/11/16 0359 12/12/16 0721 12/13/16 0601 12/14/16 0314  NA 153* 151* 148* 146* 145  K 4.3 3.9 3.7 3.7 3.9  CL 122* 121* 117* 112* 112*  CO2 20* 21* 23 25 26   GLUCOSE 55* 98 138* 123* 103*  BUN 34* 28* 24* 26* 24*  CREATININE 1.80* 1.82* 1.49* 1.31* 1.07  CALCIUM 11.1* 10.5* 10.3 10.4* 10.5*   GFR: Estimated Creatinine Clearance: 63.9 mL/min (by C-G formula based on SCr of 1.07 mg/dL). Liver Function Tests:  Recent Labs Lab 12/09/16 0350 12/10/16 0323  AST 62* 30  ALT 75* 55  ALKPHOS 230* 192*  BILITOT 1.1 0.6  PROT 9.5* 8.4*  ALBUMIN 2.2* 2.0*   No results for input(s): LIPASE, AMYLASE in the last 168 hours. No results for input(s): AMMONIA in  the last 168 hours. Coagulation Profile: No results for input(s): INR, PROTIME in the last 168 hours. Cardiac Enzymes:  Recent Labs Lab 12/09/16 0350 12/09/16 1419 12/10/16 0803 12/10/16 1315  TROPONINI 0.03* 0.17* 0.03* 0.04*   BNP (last 3 results) No results for input(s): PROBNP in the last 8760 hours. HbA1C: No results for input(s): HGBA1C in the last 72 hours. CBG:  Recent Labs Lab 12/14/16 0016 12/14/16 9449 12/14/16 0848 12/14/16 1206 12/14/16 1615  GLUCAP 128* 111* 157* 117* 95   Lipid Profile: No results for input(s): CHOL, HDL, LDLCALC, TRIG, CHOLHDL, LDLDIRECT in the last 72 hours. Thyroid Function Tests: No results for input(s): TSH, T4TOTAL, FREET4, T3FREE, THYROIDAB in the last 72 hours. Anemia Panel: No results for input(s): VITAMINB12, FOLATE, FERRITIN, TIBC, IRON, RETICCTPCT in the last 72 hours. Sepsis Labs:  Recent Labs Lab 12/09/16 0405 12/09/16 0638 12/09/16 1419  PROCALCITON  --   --  1.23  LATICACIDVEN 2.94* 1.88  --     Recent Results (from the past 240 hour(s))  Blood Culture (routine x 2)     Status: None   Collection Time: 12/09/16  3:52 AM  Result Value Ref Range Status   Specimen Description BLOOD RIGHT FOREARM  Final   Special Requests IN PEDIATRIC BOTTLE Blood Culture adequate volume  Final   Culture NO GROWTH 5 DAYS  Final   Report Status 12/14/2016 FINAL  Final  Blood Culture (routine x 2)     Status: None   Collection Time: 12/09/16  4:00 AM  Result Value Ref Range Status   Specimen Description BLOOD LEFT HAND  Final   Special Requests IN PEDIATRIC BOTTLE Blood Culture adequate volume  Final   Culture NO GROWTH 5 DAYS  Final   Report Status 12/14/2016 FINAL  Final  MRSA PCR Screening     Status: None   Collection Time: 12/09/16 12:00 PM  Result Value Ref Range Status   MRSA by PCR NEGATIVE NEGATIVE Final    Comment:        The GeneXpert MRSA Assay (FDA approved for NASAL specimens only), is one component of  a comprehensive MRSA colonization surveillance program. It is not intended to diagnose MRSA infection nor to guide or monitor treatment for MRSA infections.   Culture, respiratory (NON-Expectorated)     Status: None   Collection Time: 12/09/16 12:30 PM  Result Value Ref Range Status   Specimen Description TRACHEAL ASPIRATE  Final   Special Requests NONE  Final   Gram Stain   Final    ABUNDANT WBC PRESENT, PREDOMINANTLY PMN FEW SQUAMOUS EPITHELIAL CELLS PRESENT FEW GRAM POSITIVE RODS RARE SQUAMOUS EPITHELIAL CELLS PRESENT RARE GRAM POSITIVE COCCI IN PAIRS    Culture   Final    MODERATE STAPHYLOCOCCUS AUREUS WITHIN MIXED CULTURE    Report Status 12/12/2016 FINAL  Final   Organism ID, Bacteria STAPHYLOCOCCUS AUREUS  Final      Susceptibility   Staphylococcus aureus - MIC*    CIPROFLOXACIN <=0.5 SENSITIVE Sensitive     ERYTHROMYCIN <=0.25 SENSITIVE Sensitive     GENTAMICIN <=0.5 SENSITIVE Sensitive     OXACILLIN <=0.25 SENSITIVE Sensitive     TETRACYCLINE 8 INTERMEDIATE Intermediate     VANCOMYCIN <=0.5 SENSITIVE Sensitive     TRIMETH/SULFA <=10 SENSITIVE Sensitive     CLINDAMYCIN <=0.25 SENSITIVE Sensitive     RIFAMPIN <=0.5 SENSITIVE Sensitive     Inducible Clindamycin NEGATIVE Sensitive     * MODERATE STAPHYLOCOCCUS AUREUS         Radiology Studies: Dg Chest Port 1 View  Result Date: 12/14/2016 CLINICAL DATA:  Chronic ventilator dependent respiratory failure. Follow-up pneumothorax and follow-up bibasilar atelectasis versus pneumonia. EXAM: PORTABLE CHEST 1 VIEW COMPARISON:  12/13/2016, 12/12/2016 and earlier. FINDINGS: Tracheostomy tube tip in satisfactory position below the thoracic inlet. Cardiac silhouette normal in size for AP portable technique. No visible residual left apical pneumothorax. Persistent airspace opacities in the left lung base. Improved aeration in the right lung  base with only mild atelectasis persisting. No new pulmonary parenchymal abnormalities.  Normal pulmonary vascularity. Dystrophic calcification in the right coracoclavicular ligament as noted previously. IMPRESSION: 1. Resolution of the previously identified left apical pneumothorax. 2. Stable pneumonia involving the left lung base. 3. Improved aeration in the right lung base with only mild atelectasis persisting. 4. No new abnormalities. Electronically Signed   By: Evangeline Dakin M.D.   On: 12/14/2016 13:37   Dg Chest Port 1 View  Result Date: 12/13/2016 CLINICAL DATA:  Tracheostomy patient. Follow-up left apical pneumothorax. EXAM: PORTABLE CHEST 1 VIEW COMPARISON:  Portable chest x-ray of December 12, 2016 FINDINGS: There is a 5% or less apical pneumothorax on the left which appears stable. 3 surgical clips project over the left apex and also are unchanged in position. There is no pleural effusion. There is patchy density in the retrocardiac region on the left and in the right infrahilar region. No right-sided pneumothorax or pleural effusion is observed. The heart and pulmonary vascularity are normal. The tracheostomy tube tip projects at the inferior margin of the clavicular heads. IMPRESSION: Stable 5% or less left apical pneumothorax. Persistent bibasilar atelectasis or pneumonia. Electronically Signed   By: David  Martinique M.D.   On: 12/13/2016 07:33        Scheduled Meds: . amoxicillin-clavulanate  500 mg Per Tube Q12H  . aspirin  325 mg Per Tube Daily  . chlorhexidine gluconate (MEDLINE KIT)  15 mL Mouth Rinse BID  . cloNIDine  0.1 mg Per Tube BID  . clopidogrel  75 mg Per Tube Daily  . enoxaparin (LOVENOX) injection  40 mg Subcutaneous Q24H  . famotidine  20 mg Per Tube BID  . feeding supplement (PRO-STAT SUGAR FREE 64)  30 mL Oral BID  . free water  200 mL Per Tube Q4H  . mouth rinse  15 mL Mouth Rinse QID  . metoCLOPramide  10 mg Per Tube TID AC & HS  . metoprolol tartrate  25 mg Per Tube BID  . pravastatin  20 mg Per Tube q1800  . senna  1 tablet Per Tube BID  .  sodium chloride flush  3 mL Intravenous Q12H   Continuous Infusions: . feeding supplement (JEVITY 1.5 CAL/FIBER) 1,000 mL (12/11/16 2230)     LOS: 5 days    Time spent: 35 minutes.     Hosie Poisson, MD [facility] Pager [phone] If 7PM-7AM, please contact night-coverage [email or web address] Password [email or web address] 12/14/2016, 5:18 PM

## 2016-12-15 LAB — CBC
HCT: 27.3 % — ABNORMAL LOW (ref 39.0–52.0)
Hemoglobin: 8.4 g/dL — ABNORMAL LOW (ref 13.0–17.0)
MCH: 28.9 pg (ref 26.0–34.0)
MCHC: 30.8 g/dL (ref 30.0–36.0)
MCV: 93.8 fL (ref 78.0–100.0)
Platelets: 505 10*3/uL — ABNORMAL HIGH (ref 150–400)
RBC: 2.91 MIL/uL — ABNORMAL LOW (ref 4.22–5.81)
RDW: 16.9 % — ABNORMAL HIGH (ref 11.5–15.5)
WBC: 17.3 10*3/uL — ABNORMAL HIGH (ref 4.0–10.5)

## 2016-12-15 LAB — GLUCOSE, CAPILLARY
Glucose-Capillary: 109 mg/dL — ABNORMAL HIGH (ref 65–99)
Glucose-Capillary: 112 mg/dL — ABNORMAL HIGH (ref 65–99)
Glucose-Capillary: 113 mg/dL — ABNORMAL HIGH (ref 65–99)
Glucose-Capillary: 125 mg/dL — ABNORMAL HIGH (ref 65–99)
Glucose-Capillary: 129 mg/dL — ABNORMAL HIGH (ref 65–99)
Glucose-Capillary: 135 mg/dL — ABNORMAL HIGH (ref 65–99)
Glucose-Capillary: 145 mg/dL — ABNORMAL HIGH (ref 65–99)

## 2016-12-15 LAB — BASIC METABOLIC PANEL
Anion gap: 11 (ref 5–15)
BUN: 24 mg/dL — ABNORMAL HIGH (ref 6–20)
CO2: 25 mmol/L (ref 22–32)
Calcium: 10.5 mg/dL — ABNORMAL HIGH (ref 8.9–10.3)
Chloride: 106 mmol/L (ref 101–111)
Creatinine, Ser: 0.98 mg/dL (ref 0.61–1.24)
GFR calc Af Amer: >60 mL/min (ref >60)
GFR calc non Af Amer: >60 mL/min (ref >60)
Glucose, Bld: 116 mg/dL — ABNORMAL HIGH (ref 65–99)
Potassium: 4.1 mmol/L (ref 3.5–5.1)
Sodium: 142 mmol/L (ref 135–145)

## 2016-12-15 NOTE — Progress Notes (Addendum)
4:15pm Scott Rivera # [id number], RVC, 543min/wk - pt will have to DC by Sunday in order for auth to be good  Pt currently on 28% trach collar as long as stable on this till tomorrow afternoon will be able to DC to SNF tomorrow  10am Pt still on 40% trach collar this morning- insurance authorization initiated for possible weekend DC- awaiting determination  CSW will continue to follow  Jorge Ny, [place] Worker [phone]

## 2016-12-15 NOTE — Progress Notes (Addendum)
Nutrition Follow Up  DOCUMENTATION CODES:   Severe malnutrition in context of chronic illness  INTERVENTION:    Continue Jevity 1.5 at 50 ml/hr with Prostat 30 ml BID   TF regimen providing 2000 kcal, 107 gm protein, 912 ml free water daily  NUTRITION DIAGNOSIS:   Malnutrition (severe) related to chronic illness (respiratory failure with trach, stroke, aspiration PNA) as evidenced by severe depletion of muscle mass, severe depletion of body fat, percent weight loss, ongoing  GOAL:   Patient will meet greater than or equal to 90% of their needs, met  MONITOR:   TF tolerance, Skin, Labs, I & O's  ASSESSMENT:   66 yo male with PMH of trach & PEG, stroke, HTN, aspiration PNA, CKD-3, anemia who was admitted from nursing facility with severe sepsis and aspiration PNA.  Pt nonverbal. On trach collar. Jevity 1.5 formula infusing at goal rate 50 ml/hr via PEG tube. Tolerating well. Medications reviewed and include Reglan and Pepcid. . Labs reviewed. BUN 24 (H). CBG's [id number]. Free water flushes at 200 ml every 4 hours. Noted possible disharge over weekend.  Diet Order:  Diet NPO time specified Diet - low sodium heart healthy  Skin:  Wound (see comment) (stage II shoulder, head, elbow, sacrum)  Last BM:  8/29   Intake/Output Summary (Last 24 hours) at 12/15/16 1209 Last data filed at 12/15/16 0600  Gross per 24 hour  Intake             2053 ml  Output              250 ml  Net             1803 ml   Height:   Ht Readings from Last 1 Encounters:  12/09/16 _0  (1.803 m)    Weight:   Wt Readings from Last 1 Encounters:  12/15/16 145 lb 8.1 oz (66 kg)    Ideal Body Weight:  78.2 kg  BMI:  Body mass index is 20.29 kg/m.  Estimated Nutritional Needs:   Kcal:  1900-2100  Protein:  95-110 gm  Fluid:  2 L  EDUCATION NEEDS:   No education needs identified at this time  Arthur Holms, RD, LDN Pager #: [phone] After-Hours Pager #: [phone]

## 2016-12-15 NOTE — Progress Notes (Signed)
PROGRESS NOTE    Scott Rivera  UKG:[id number] DOB: 1950-09-07 DOA: 12/09/2016 PCP: Patient, No Pcp Per    Brief Narrative: Scott Rivera is a 66 y.o. 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  He was admitted for sepsis due to aspiration pneumonia. IV fluids and vancomycin/cefepime were given for sepsis. Lactic acid normalized. Hgb dropped 9.2 > 7.8 with IV fluids, so 2u PRBCs given with improvement to 9.9. Troponin was mildly elevated but with downward trend 0.17 > 0.03. Cr has improved from 2.47 on admission. Zosyn has been continued for aspiration pneumonia and tube feeds were titrated back to 50cc/hr on 8/27. Patient is on 98% trach collar, CSW informed us that patient's oxygen has to be less than 28% to be discharged to SNF.  Assessment & Plan:   Principal Problem:   Severe sepsis with acute organ dysfunction (HCC) Active Problems:   Essential hypertension   Dysphagia, post-stroke   Tobacco abuse   Tachypnea   Hyperglycemia   Hypernatremia   Leukocytosis (leucocytosis)   Aspiration pneumonia (HCC)   Goals of care, counseling/discussion   Hypotension   Acute-on-chronic kidney injury ([place])   Elevated troponin   Anemia due to chronic kidney disease   Compensated metabolic alkalosis   Pneumothorax, closed, traumatic, initial encounter   Tracheostomy status ([place])   Pneumothorax   Hypoxemia   Sepsis sec to aspiration pneumonitis:  Improving.  was on zosyn transition to augmentin via tube to complete the course.  Continue with trach care and pulmonologist assisting with care.  Respiratory to assist [place] taper the oxygen to less than 28% int he next few days , so we can discharge him back to SNF. Currently on 40% trach collar.     Acute on chronic respiratory failure:    Trach dependent.    Acute on CKD stage 3.   creatinine Currently at baseline.    Anemia of chronic disease with a component of acute illness anemia:  S/p 2 units of prbc transfusion and hemoglobin around 8. Monitor intermittently.  No signs of active bleeding.   Elevated troponin: Due to demand ischemia in setting of aspiration event/sepsis. Downward trend, no acute changes on ECG.  - No further intervention planned at this time  Traumatic pneumothorax: On CXR 8/26. No mediastinal shift. Stable on 8/28.  - taper the oxygen to less than 28% in the next few days, as appropriate. No issues.   H/o recent CVA:  Continue with aspirin and plavix  Via tube.    Severe protein energy malnutrition:  Currently on tube feeds and dietary on board.    Hypertension:  Prn labetalol.   well controlled.   Chronic leukocytosis:  Recommended outpatient follow up with a hematologist if it doesnt improve in 2 weeks.    Hypercalcemia:  Get PTH.  Asymptomatic.     DVT prophylaxis: (Lovenox Code Status: full code.  Family Communication: none at bedside.  Disposition Plan: pending further eval.  Possibly SNF when we can wean his oxygen to less than 28%.    Consultants:   PCCM.   PALLIATIVE CARE.   Procedures:Left pneumothorax needle decompression 8/25 Antimicrobials:   Vancomycin 8/25 >> 8/28  Cefepime 8/25  Zosyn 8/26 >> 8/29  augmentin.   Subjective: Appears comfortable.   Objective: Vitals:   12/15/16 1107 12/15/16 1223 12/15/16  1423 12/15/16 1601  BP:  129/74  108/80  Pulse: 92 90 89 (!) 102  Resp: (!) 36 (!) 33 20 (!) 25  Temp:  99.5 F (37.5 C)    TempSrc:  Axillary    SpO2: 97% 92% 95% 97%  Weight:      Height:        Intake/Output Summary (Last 24 hours) at 12/15/16 1758 Last data filed at 12/15/16 0600  Gross per 24 hour  Intake             1303 ml  Output                0 ml  Net             1303 ml   Filed Weights   12/13/16 0408 12/14/16  0500 12/15/16 0454  Weight: 65.4 kg (144 lb 2.9 oz) 66.5 kg (146 lb 9.7 oz) 66 kg (145 lb 8.1 oz)    Examination:  General exam: Appears calm and comfortable  Does not respond to verbal cues.  Neck: Trach in place. On 40% oxygen.  Respiratory system: diminished at bases, no wheezing orr honchi.  Cardiovascular system: s1s2, RRR, no JVD.  Gastrointestinal system: Abdomen is soft NT nd bs+ Central nervous system: alert and comfortable. Able to move extremities.  Extremities: Symmetric 5 x 5 power. Psychiatry: couldn't be assessed due to his baseline diminished mental acuity.    Data Reviewed: I have personally reviewed following labs and imaging studies  CBC:  Recent Labs Lab 12/09/16 0350  12/11/16 0359 12/12/16 0721 12/13/16 0601 12/14/16 0314 12/15/16 0345  WBC 27.6*  < > 16.1* 14.9* 14.0* 14.7* 17.3*  NEUTROABS 22.3*  --   --   --   --   --   --   HGB 9.2*  < > 7.8* 7.5* 8.1* 8.1* 8.4*  HCT 30.7*  < > 25.3* 25.1* 26.6* 27.2* 27.3*  MCV 94.5  < > 93.0 93.3 94.0 93.8 93.8  PLT 836*  < > 540* 511* 475* 496* 505*  < > = values in this interval not displayed. Basic Metabolic Panel:  Recent Labs Lab 12/11/16 0359 12/12/16 0721 12/13/16 0601 12/14/16 0314 12/15/16 0345  NA 151* 148* 146* 145 142  K 3.9 3.7 3.7 3.9 4.1  CL 121* 117* 112* 112* 106  CO2 21* 23 25 26 25   GLUCOSE 98 138* 123* 103* 116*  BUN 28* 24* 26* 24* 24*  CREATININE 1.82* 1.49* 1.31* 1.07 0.98  CALCIUM 10.5* 10.3 10.4* 10.5* 10.5*   GFR: Estimated Creatinine Clearance: 69.2 mL/min (by C-G formula based on SCr of 0.98 mg/dL). Liver Function Tests:  Recent Labs Lab 12/09/16 0350 12/10/16 0323  AST 62* 30  ALT 75* 55  ALKPHOS 230* 192*  BILITOT 1.1 0.6  PROT 9.5* 8.4*  ALBUMIN 2.2* 2.0*   No results for input(s): LIPASE, AMYLASE in the last 168 hours. No results for input(s): AMMONIA in the last 168 hours. Coagulation Profile: No results for input(s): INR, PROTIME in the last 168  hours. Cardiac Enzymes:  Recent Labs Lab 12/09/16 0350 12/09/16 1419 12/10/16 0803 12/10/16 1315  TROPONINI 0.03* 0.17* 0.03* 0.04*   BNP (last 3 results) No results for input(s): PROBNP in the last 8760 hours. HbA1C: No results for input(s): HGBA1C in the last 72 hours. CBG:  Recent Labs Lab 12/14/16 2044 12/15/16 0011 12/15/16 0455 12/15/16 0749 12/15/16 1225  GLUCAP 112* 112* 135* 129* 145*   Lipid Profile: No results  for input(s): CHOL, HDL, LDLCALC, TRIG, CHOLHDL, LDLDIRECT in the last 72 hours. Thyroid Function Tests: No results for input(s): TSH, T4TOTAL, FREET4, T3FREE, THYROIDAB in the last 72 hours. Anemia Panel: No results for input(s): VITAMINB12, FOLATE, FERRITIN, TIBC, IRON, RETICCTPCT in the last 72 hours. Sepsis Labs:  Recent Labs Lab 12/09/16 0405 12/09/16 0638 12/09/16 1419  PROCALCITON  --   --  1.23  LATICACIDVEN 2.94* 1.88  --     Recent Results (from the past 240 hour(s))  Blood Culture (routine x 2)     Status: None   Collection Time: 12/09/16  3:52 AM  Result Value Ref Range Status   Specimen Description BLOOD RIGHT FOREARM  Final   Special Requests IN PEDIATRIC BOTTLE Blood Culture adequate volume  Final   Culture NO GROWTH 5 DAYS  Final   Report Status 12/14/2016 FINAL  Final  Blood Culture (routine x 2)     Status: None   Collection Time: 12/09/16  4:00 AM  Result Value Ref Range Status   Specimen Description BLOOD LEFT HAND  Final   Special Requests IN PEDIATRIC BOTTLE Blood Culture adequate volume  Final   Culture NO GROWTH 5 DAYS  Final   Report Status 12/14/2016 FINAL  Final  MRSA PCR Screening     Status: None   Collection Time: 12/09/16 12:00 PM  Result Value Ref Range Status   MRSA by PCR NEGATIVE NEGATIVE Final    Comment:        The GeneXpert MRSA Assay (FDA approved for NASAL specimens only), is one component of a comprehensive MRSA colonization surveillance program. It is not intended to diagnose MRSA infection  nor to guide or monitor treatment for MRSA infections.   Culture, respiratory (NON-Expectorated)     Status: None   Collection Time: 12/09/16 12:30 PM  Result Value Ref Range Status   Specimen Description TRACHEAL ASPIRATE  Final   Special Requests NONE  Final   Gram Stain   Final    ABUNDANT WBC PRESENT, PREDOMINANTLY PMN FEW SQUAMOUS EPITHELIAL CELLS PRESENT FEW GRAM POSITIVE RODS RARE SQUAMOUS EPITHELIAL CELLS PRESENT RARE GRAM POSITIVE COCCI IN PAIRS    Culture   Final    MODERATE STAPHYLOCOCCUS AUREUS WITHIN MIXED CULTURE    Report Status 12/12/2016 FINAL  Final   Organism ID, Bacteria STAPHYLOCOCCUS AUREUS  Final      Susceptibility   Staphylococcus aureus - MIC*    CIPROFLOXACIN <=0.5 SENSITIVE Sensitive     ERYTHROMYCIN <=0.25 SENSITIVE Sensitive     GENTAMICIN <=0.5 SENSITIVE Sensitive     OXACILLIN <=0.25 SENSITIVE Sensitive     TETRACYCLINE 8 INTERMEDIATE Intermediate     VANCOMYCIN <=0.5 SENSITIVE Sensitive     TRIMETH/SULFA <=10 SENSITIVE Sensitive     CLINDAMYCIN <=0.25 SENSITIVE Sensitive     RIFAMPIN <=0.5 SENSITIVE Sensitive     Inducible Clindamycin NEGATIVE Sensitive     * MODERATE STAPHYLOCOCCUS AUREUS         Radiology Studies: Dg Chest Port 1 View  Result Date: 12/14/2016 CLINICAL DATA:  Acute respiratory distress EXAM: PORTABLE CHEST 1 VIEW COMPARISON:  12/14/2016, 12/13/2016, 12/12/2016 FINDINGS: Tracheostomy tube is in place. Continued patchy infiltrate at the left lung base. Suspect tiny left effusion. Minimal atelectasis or infiltrate at the right base unchanged. Stable cardiomediastinal silhouette. No definitive pneumothorax is seen IMPRESSION: 1. Continued patchy infiltrates at the left greater than right lung base with suspected tiny left effusion. Electronically Signed   By: Madie Reno.D.  On: 12/14/2016 23:33   Dg Chest Port 1 View  Result Date: 12/14/2016 CLINICAL DATA:  Chronic ventilator dependent respiratory failure. Follow-up  pneumothorax and follow-up bibasilar atelectasis versus pneumonia. EXAM: PORTABLE CHEST 1 VIEW COMPARISON:  12/13/2016, 12/12/2016 and earlier. FINDINGS: Tracheostomy tube tip in satisfactory position below the thoracic inlet. Cardiac silhouette normal in size for AP portable technique. No visible residual left apical pneumothorax. Persistent airspace opacities in the left lung base. Improved aeration in the right lung base with only mild atelectasis persisting. No new pulmonary parenchymal abnormalities. Normal pulmonary vascularity. Dystrophic calcification in the right coracoclavicular ligament as noted previously. IMPRESSION: 1. Resolution of the previously identified left apical pneumothorax. 2. Stable pneumonia involving the left lung base. 3. Improved aeration in the right lung base with only mild atelectasis persisting. 4. No new abnormalities. Electronically Signed   By: Evangeline Dakin M.D.   On: 12/14/2016 13:37        Scheduled Meds: . amoxicillin-clavulanate  500 mg Per Tube Q12H  . aspirin  325 mg Per Tube Daily  . chlorhexidine gluconate (MEDLINE KIT)  15 mL Mouth Rinse BID  . cloNIDine  0.1 mg Per Tube BID  . clopidogrel  75 mg Per Tube Daily  . enoxaparin (LOVENOX) injection  40 mg Subcutaneous Q24H  . famotidine  20 mg Per Tube BID  . feeding supplement (PRO-STAT SUGAR FREE 64)  30 mL Oral BID  . free water  200 mL Per Tube Q4H  . mouth rinse  15 mL Mouth Rinse QID  . metoCLOPramide  10 mg Per Tube TID AC & HS  . metoprolol tartrate  25 mg Per Tube BID  . pravastatin  20 mg Per Tube q1800  . senna  1 tablet Per Tube BID  . sodium chloride flush  3 mL Intravenous Q12H   Continuous Infusions: . feeding supplement (JEVITY 1.5 CAL/FIBER) 1,000 mL (12/15/16 1256)     LOS: 6 days    Time spent: 35 minutes.     Hosie Poisson, MD [facility] Pager [phone] If 7PM-7AM, please contact night-coverage [email or web address] Password [facility] 12/15/2016, 5:58 PM

## 2016-12-16 ENCOUNTER — Inpatient Hospital Stay (HOSPITAL_COMMUNITY): Payer: Medicare HMO

## 2016-12-16 LAB — CBC
HCT: 26.3 % — ABNORMAL LOW (ref 39.0–52.0)
Hemoglobin: 8.2 g/dL — ABNORMAL LOW (ref 13.0–17.0)
MCH: 28.4 pg (ref 26.0–34.0)
MCHC: 31.2 g/dL (ref 30.0–36.0)
MCV: 91 fL (ref 78.0–100.0)
Platelets: 483 10*3/uL — ABNORMAL HIGH (ref 150–400)
RBC: 2.89 MIL/uL — ABNORMAL LOW (ref 4.22–5.81)
RDW: 16.8 % — ABNORMAL HIGH (ref 11.5–15.5)
WBC: 18.6 10*3/uL — ABNORMAL HIGH (ref 4.0–10.5)

## 2016-12-16 LAB — URINALYSIS, ROUTINE W REFLEX MICROSCOPIC
Bacteria, UA: NONE SEEN
Bilirubin Urine: NEGATIVE
Glucose, UA: NEGATIVE mg/dL
Hgb urine dipstick: NEGATIVE
Ketones, ur: NEGATIVE mg/dL
Leukocytes, UA: NEGATIVE
Nitrite: NEGATIVE
Protein, ur: 30 mg/dL — AB
Specific Gravity, Urine: 1.018 (ref 1.005–1.030)
Squamous Epithelial / LPF: NONE SEEN
pH: 5 (ref 5.0–8.0)

## 2016-12-16 LAB — GLUCOSE, CAPILLARY
Glucose-Capillary: 123 mg/dL — ABNORMAL HIGH (ref 65–99)
Glucose-Capillary: 125 mg/dL — ABNORMAL HIGH (ref 65–99)
Glucose-Capillary: 143 mg/dL — ABNORMAL HIGH (ref 65–99)
Glucose-Capillary: 113 mg/dL — ABNORMAL HIGH (ref 65–99)
Glucose-Capillary: 102 mg/dL — ABNORMAL HIGH (ref 65–99)

## 2016-12-16 LAB — BASIC METABOLIC PANEL
Anion gap: 9 (ref 5–15)
BUN: 25 mg/dL — ABNORMAL HIGH (ref 6–20)
CO2: 27 mmol/L (ref 22–32)
Calcium: 10.2 mg/dL (ref 8.9–10.3)
Chloride: 105 mmol/L (ref 101–111)
Creatinine, Ser: 0.96 mg/dL (ref 0.61–1.24)
GFR calc Af Amer: >60 mL/min (ref >60)
GFR calc non Af Amer: >60 mL/min (ref >60)
Glucose, Bld: 138 mg/dL — ABNORMAL HIGH (ref 65–99)
Potassium: 4 mmol/L (ref 3.5–5.1)
Sodium: 141 mmol/L (ref 135–145)

## 2016-12-16 MED ORDER — IBUPROFEN 100 MG/5ML PO SUSP
400.0000 mg | Freq: Four times a day (QID) | ORAL | Status: DC | PRN
  Administered 2016-12-16 – 2016-12-17 (×2): 400 mg
  Filled 2016-12-16 (×2): qty 20

## 2016-12-16 NOTE — Progress Notes (Signed)
PROGRESS NOTE    Scott Rivera  MRN:[id number] DOB: 06/28/1950 DOA: 12/09/2016 PCP: Patient, No Pcp Per    Brief Narrative: Scott Rivera is a 66 y.o. 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  He was admitted for sepsis due to aspiration pneumonia. IV fluids and vancomycin/cefepime were given for sepsis. Lactic acid normalized. Hgb dropped 9.2 > 7.8 with IV fluids, so 2u PRBCs given with improvement to 9.9. Troponin was mildly elevated but with downward trend 0.17 > 0.03. Cr has improved from 2.47 on admission. Zosyn has been continued for aspiration pneumonia and tube feeds were titrated back to 50cc/hr on 8/27. Patient is on 98% trach collar, CSW informed us that patient's oxygen has to be less than 28% to be discharged to SNF.  Assessment & Plan:   Principal Problem:   Severe sepsis with acute organ dysfunction (HCC) Active Problems:   Essential hypertension   Dysphagia, post-stroke   Tobacco abuse   Tachypnea   Hyperglycemia   Hypernatremia   Leukocytosis (leucocytosis)   Aspiration pneumonia (HCC)   Goals of care, counseling/discussion   Hypotension   Acute-on-chronic kidney injury (HCC)   Elevated troponin   Anemia due to chronic kidney disease   Compensated metabolic alkalosis   Pneumothorax, closed, traumatic, initial encounter   Tracheostomy status (HCC)   Pneumothorax   Hypoxemia   Sepsis sec to aspiration pneumonitis:  Improving.  was on zosyn transitioned to augmentin via tube to complete the course.  Continue with trach care and pulmonologist assisting with care.  Patient had a fever of 102. Repeat CXR, UA, and blood cultures ordered. Worsening leukocytosis.  Wound not escalate antibiotics yet. Resume augmentin.    Acute on chronic respiratory failure:    Trach dependent. Currently on 28% oxygen.    Acute on CKD stage 3.   creatinine Currently at baseline.    Anemia of chronic disease with a component of acute illness anemia:  S/p 2 units of prbc transfusion and hemoglobin around 8. Monitor intermittently.  No signs of active bleeding.   Elevated troponin: Due to demand ischemia in setting of aspiration event/sepsis. Downward trend, no acute changes on ECG.  - No further intervention planned at this time  Traumatic pneumothorax: On CXR 8/26. No mediastinal shift. Stable on 8/28.  - taper the oxygen to less than 28% in the next few days, as appropriate. No issues.   H/o recent CVA:  Continue with aspirin and plavix  Via tube.    Severe protein energy malnutrition:  Currently on tube feeds and dietary on board.    Hypertension:  Prn labetalol.   well controlled.   Chronic leukocytosis:  Recommended outpatient follow up with a hematologist if it doesnt improve in 2 weeks.    Hypercalcemia:  PTH pending. Resolved.  Asymptomatic.     DVT prophylaxis: (Lovenox) Code Status: full code.  Family Communication: family at bedside.  Disposition Plan: pending further eval.  Possibly SNF when afebrile for 24 hours.    Consultants:   PCCM.   PALLIATIVE CARE.   Procedures:Left pneumothorax needle decompression 8/25 Antimicrobials:   Vancomycin 8/25 >> 8/28  Cefepime 8/25  Zosyn 8/26 >> 8/29  augmentin.   Subjective: Appears comfortable. No complaints.   Objective: Vitals:   12/16/16 1134 12/16/16 1200 12/16/16 1601 12/16/16 1610  BP: (!)   109/54 (!) 121/57 (!) 90/40 118/76  Pulse: 92 85 81 81  Resp: (!) 26 (!) 31 (!) 23 (!) 24  Temp:  98.3 F (36.8 C)  98.1 F (36.7 C)  TempSrc:  Axillary  Axillary  SpO2: 97% 98% 100% 100%  Weight:      Height:        Intake/Output Summary (Last 24 hours) at 12/16/16 1734 Last data filed at 12/16/16 1300  Gross per 24 hour  Intake             1153 ml  Output              1570 ml  Net             -417 ml   Filed Weights   12/14/16 0500 12/15/16 0454 12/16/16 0435  Weight: 66.5 kg (146 lb 9.7 oz) 66 kg (145 lb 8.1 oz) 67.6 kg (149 lb 0.5 oz)    Examination:  General exam: Appears calm and comfortable  Does not respond to verbal cues.  Neck: Trach in place. On 28% oxygen.  Respiratory system: diminished at bases, no wheezing orr honchi.  Cardiovascular system: s1s2, RRR, no JVD.  Gastrointestinal system: Abdomen is soft NT nd bs+ Central nervous system: alert and comfortable. Able to move extremities.  Extremities: Symmetric 5 x 5 power. No pedal edema.  Psychiatry: couldn't be assessed due to his baseline diminished mental acuity.    Data Reviewed: I have personally reviewed following labs and imaging studies  CBC:  Recent Labs Lab 12/12/16 0721 12/13/16 0601 12/14/16 0314 12/15/16 0345 12/16/16 1232  WBC 14.9* 14.0* 14.7* 17.3* 18.6*  HGB 7.5* 8.1* 8.1* 8.4* 8.2*  HCT 25.1* 26.6* 27.2* 27.3* 26.3*  MCV 93.3 94.0 93.8 93.8 91.0  PLT 511* 475* 496* 505* 483*   Basic Metabolic Panel:  Recent Labs Lab 12/12/16 0721 12/13/16 0601 12/14/16 0314 12/15/16 0345 12/16/16 0535  NA 148* 146* 145 142 141  K 3.7 3.7 3.9 4.1 4.0  CL 117* 112* 112* 106 105  CO2 23 25 26 25 27  GLUCOSE 138* 123* 103* 116* 138*  BUN 24* 26* 24* 24* 25*  CREATININE 1.49* 1.31* 1.07 0.98 0.96  CALCIUM 10.3 10.4* 10.5* 10.5* 10.2   GFR: Estimated Creatinine Clearance: 72.4 mL/min (by C-G formula based on SCr of 0.96 mg/dL). Liver Function Tests:  Recent Labs Lab 12/10/16 0323  AST 30  ALT 55  ALKPHOS 192*  BILITOT 0.6  PROT 8.4*  ALBUMIN 2.0*   No results for input(s): LIPASE, AMYLASE in the last 168 hours. No results for input(s): AMMONIA in the last 168 hours. Coagulation Profile: No results for input(s): INR, PROTIME in the last 168 hours. Cardiac Enzymes:  Recent Labs Lab 12/10/16 0803 12/10/16 1315  TROPONINI 0.03* 0.04*   BNP  (last 3 results) No results for input(s): PROBNP in the last 8760 hours. HbA1C: No results for input(s): HGBA1C in the last 72 hours. CBG:  Recent Labs Lab 12/15/16 1924 12/15/16 2353 12/16/16 0355 12/16/16 0822 12/16/16 1116  GLUCAP 109* 125* 123* 125* 143*   Lipid Profile: No results for input(s): CHOL, HDL, LDLCALC, TRIG, CHOLHDL, LDLDIRECT in the last 72 hours. Thyroid Function Tests: No results for input(s): TSH, T4TOTAL, FREET4, T3FREE, THYROIDAB in the last 72 hours. Anemia Panel: No results for input(s): VITAMINB12, FOLATE, FERRITIN, TIBC, IRON, RETICCTPCT in the last 72 hours. Sepsis Labs: No results for input(s): PROCALCITON, LATICACIDVEN in the last 168 hours.  Recent Results (from the   past 240 hour(s))  Blood Culture (routine x 2)     Status: None   Collection Time: 12/09/16  3:52 AM  Result Value Ref Range Status   Specimen Description BLOOD RIGHT FOREARM  Final   Special Requests IN PEDIATRIC BOTTLE Blood Culture adequate volume  Final   Culture NO GROWTH 5 DAYS  Final   Report Status 12/14/2016 FINAL  Final  Blood Culture (routine x 2)     Status: None   Collection Time: 12/09/16  4:00 AM  Result Value Ref Range Status   Specimen Description BLOOD LEFT HAND  Final   Special Requests IN PEDIATRIC BOTTLE Blood Culture adequate volume  Final   Culture NO GROWTH 5 DAYS  Final   Report Status 12/14/2016 FINAL  Final  MRSA PCR Screening     Status: None   Collection Time: 12/09/16 12:00 PM  Result Value Ref Range Status   MRSA by PCR NEGATIVE NEGATIVE Final    Comment:        The GeneXpert MRSA Assay (FDA approved for NASAL specimens only), is one component of a comprehensive MRSA colonization surveillance program. It is not intended to diagnose MRSA infection nor to guide or monitor treatment for MRSA infections.   Culture, respiratory (NON-Expectorated)     Status: None   Collection Time: 12/09/16 12:30 PM  Result Value Ref Range Status   Specimen  Description TRACHEAL ASPIRATE  Final   Special Requests NONE  Final   Gram Stain   Final    ABUNDANT WBC PRESENT, PREDOMINANTLY PMN FEW SQUAMOUS EPITHELIAL CELLS PRESENT FEW GRAM POSITIVE RODS RARE SQUAMOUS EPITHELIAL CELLS PRESENT RARE GRAM POSITIVE COCCI IN PAIRS    Culture   Final    MODERATE STAPHYLOCOCCUS AUREUS WITHIN MIXED CULTURE    Report Status 12/12/2016 FINAL  Final   Organism ID, Bacteria STAPHYLOCOCCUS AUREUS  Final      Susceptibility   Staphylococcus aureus - MIC*    CIPROFLOXACIN <=0.5 SENSITIVE Sensitive     ERYTHROMYCIN <=0.25 SENSITIVE Sensitive     GENTAMICIN <=0.5 SENSITIVE Sensitive     OXACILLIN <=0.25 SENSITIVE Sensitive     TETRACYCLINE 8 INTERMEDIATE Intermediate     VANCOMYCIN <=0.5 SENSITIVE Sensitive     TRIMETH/SULFA <=10 SENSITIVE Sensitive     CLINDAMYCIN <=0.25 SENSITIVE Sensitive     RIFAMPIN <=0.5 SENSITIVE Sensitive     Inducible Clindamycin NEGATIVE Sensitive     * MODERATE STAPHYLOCOCCUS AUREUS         Radiology Studies: Dg Chest Port 1 View  Result Date: 12/16/2016 CLINICAL DATA:  Fever, tracheostomy EXAM: PORTABLE CHEST 1 VIEW COMPARISON:  12/15/2007 FINDINGS: Stable tracheostomy in the mid trachea. Normal heart size and vascularity. Mild bibasilar ill-defined patchy opacities as before. Little interval change. No new collapse or consolidation. No superimposed edema, effusion or pneumothorax. Atherosclerosis of aorta. IMPRESSION: Stable mild bibasilar airspace process versus atelectasis. No significant interval change. Electronically Signed   By: Jerilynn Mages.  Shick M.D.   On: 12/16/2016 14:10   Dg Chest Port 1 View  Result Date: 12/14/2016 CLINICAL DATA:  Acute respiratory distress EXAM: PORTABLE CHEST 1 VIEW COMPARISON:  12/14/2016, 12/13/2016, 12/12/2016 FINDINGS: Tracheostomy tube is in place. Continued patchy infiltrate at the left lung base. Suspect tiny left effusion. Minimal atelectasis or infiltrate at the right base unchanged. Stable  cardiomediastinal silhouette. No definitive pneumothorax is seen IMPRESSION: 1. Continued patchy infiltrates at the left greater than right lung base with suspected tiny left effusion. Electronically Signed   By:  Kim  Fujinaga M.D.   On: 12/14/2016 23:33        Scheduled Meds: . amoxicillin-clavulanate  500 mg Per Tube Q12H  . aspirin  325 mg Per Tube Daily  . chlorhexidine gluconate (MEDLINE KIT)  15 mL Mouth Rinse BID  . cloNIDine  0.1 mg Per Tube BID  . clopidogrel  75 mg Per Tube Daily  . enoxaparin (LOVENOX) injection  40 mg Subcutaneous Q24H  . famotidine  20 mg Per Tube BID  . feeding supplement (PRO-STAT SUGAR FREE 64)  30 mL Oral BID  . free water  200 mL Per Tube Q4H  . mouth rinse  15 mL Mouth Rinse QID  . metoCLOPramide  10 mg Per Tube TID AC & HS  . metoprolol tartrate  25 mg Per Tube BID  . pravastatin  20 mg Per Tube q1800  . senna  1 tablet Per Tube BID  . sodium chloride flush  3 mL Intravenous Q12H   Continuous Infusions: . feeding supplement (JEVITY 1.5 CAL/FIBER) 1,000 mL (12/16/16 1041)     LOS: 7 days    Time spent: 35 minutes.     ,, MD [facility] Pager [phone] If 7PM-7AM, please contact night-coverage [email or web address] Password [email or web address] 12/16/2016, 5:34 PM   

## 2016-12-16 NOTE — Progress Notes (Signed)
Rectal temperature of 102.4 (39.1) this a.m. Tylenol PRN given, rectal temp rechecked and value of 101.2 (38.4) taken. Doctor paged and order for PRN Ibuprofen received and given to patient.

## 2016-12-17 DIAGNOSIS — L89153 Pressure ulcer of sacral region, stage 3: Secondary | ICD-10-CM | POA: Diagnosis present

## 2016-12-17 HISTORY — DX: Pressure ulcer of sacral region, stage 3: L89.153

## 2016-12-17 LAB — GLUCOSE, CAPILLARY
Glucose-Capillary: 102 mg/dL — ABNORMAL HIGH (ref 65–99)
Glucose-Capillary: 109 mg/dL — ABNORMAL HIGH (ref 65–99)
Glucose-Capillary: 131 mg/dL — ABNORMAL HIGH (ref 65–99)
Glucose-Capillary: 122 mg/dL — ABNORMAL HIGH (ref 65–99)
Glucose-Capillary: 87 mg/dL (ref 65–99)
Glucose-Capillary: 132 mg/dL — ABNORMAL HIGH (ref 65–99)

## 2016-12-17 LAB — PTH, INTACT AND CALCIUM
Calcium, Total (PTH): 10.2 mg/dL (ref 8.6–10.2)
PTH: 15 pg/mL (ref 15–65)

## 2016-12-17 MED ORDER — PIPERACILLIN-TAZOBACTAM 3.375 G IVPB
3.3750 g | Freq: Three times a day (TID) | INTRAVENOUS | Status: DC
  Administered 2016-12-17 – 2016-12-21 (×11): 3.375 g via INTRAVENOUS
  Filled 2016-12-17 (×14): qty 50

## 2016-12-17 NOTE — Progress Notes (Signed)
Pharmacy Antibiotic Note  Scott Rivera is a 67 y.o. male admitted on 12/09/2016 for sepsis due to aspiration pneumonia. IV fluids and vancomycin/cefepime were given for sepsis. Vanc/cefepime stopped on 8/29 and switched to oral Augmentin.   Today fever, Tc to 100.9, WBC rising, 18.6k yesterday 9/1.  Today pharmacy has been consulted for Zosyn dosing for aspiration pneumonia.   Plan: Zosyn 3.375 g IV q8h (4 hour infusions) Monitor clinical status, renal function, culture results daily.  Height: 5\' 11"  (180.3 cm) Weight: 147 lb 4.3 oz (66.8 kg) IBW/kg (Calculated) : 75.3  Temp (24hrs), Avg:98.6 F (37 C), Min:97.7 F (36.5 C), Max:100.9 F (38.3 C)   Recent Labs Lab 12/12/16 0721 12/13/16 0601 12/14/16 0314 12/15/16 0345 12/16/16 0535 12/16/16 1232  WBC 14.9* 14.0* 14.7* 17.3*  --  18.6*  CREATININE 1.49* 1.31* 1.07 0.98 0.96  --     Estimated Creatinine Clearance: 71.5 mL/min (by C-G formula based on SCr of 0.96 mg/dL).    No Known Allergies  Antimicrobials this admission: Cefepime 8/25 >>8/26 Vanc 8/25 >>8/28 Zosyn 8/26 >>8/28;  Restart 9/2>> Augmentin 8/28>>(9/4)   Dose adjustments this admission: n/a  Microbiology results: 9/1 BCx: ngtd <24 hours 8/25 MRSA: neg 8/25 BCx: Neg/final 8/25 resp:  moderate MSSA  Thank you for allowing pharmacy to be a part of this patient's care. Nicole Cella, RPh Clinical Pharmacist Pager: [phone] 12/17/2016 11:54 AM

## 2016-12-17 NOTE — Progress Notes (Signed)
PROGRESS NOTE    Scott Rivera  UQJ:[id number] DOB: 1950/05/15 DOA: 12/09/2016 PCP: Patient, No Pcp Per    Brief Narrative: Scott Rivera is a 66 y.o. 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  He was admitted for sepsis due to aspiration pneumonia. IV fluids and vancomycin/cefepime were given for sepsis. Lactic acid normalized. Hgb dropped 9.2 > 7.8 with IV fluids, so 2u PRBCs given with improvement to 9.9. Troponin was mildly elevated but with downward trend 0.17 > 0.03. Cr has improved from 2.47 on admission. Zosyn has been continued for aspiration pneumonia and tube feeds were titrated back to 50cc/hr on 8/27. Patient is on 98% trach collar, CSW informed us that patient's oxygen has to be less than 28% to be discharged to SNF.  Assessment & Plan:   Principal Problem:   Severe sepsis with acute organ dysfunction (HCC) Active Problems:   Essential hypertension   Dysphagia, post-stroke   Tobacco abuse   Tachypnea   Hyperglycemia   Hypernatremia   Leukocytosis (leucocytosis)   Aspiration pneumonia (HCC)   Goals of care, counseling/discussion   Hypotension   Acute-on-chronic kidney injury ([place])   Elevated troponin   Anemia due to chronic kidney disease   Compensated metabolic alkalosis   Pneumothorax, closed, traumatic, initial encounter   Tracheostomy status (HCC)   Pneumothorax   Hypoxemia   Stage III pressure ulcer of sacral region ([place])   Sepsis sec to aspiration pneumonitis:  Improving.  was on zosyn transitioned to augmentin via tube to complete the course, but patient had thick copious secretions coming from the trach and he was running fevers over the last 48 hours, UA Is negative, and CXR does nto show any new changes, hence changed the augmentin back to zosyn.    Continue with trach care and pulmonologist assisting with care.  Repeat CBC tonight to check wbc count.  Blood cultures negative so far.  CXR unchanged from before .      Acute on chronic respiratory failure:  Trach dependent. Currently on 28% oxygen.    Acute on CKD stage 3.   creatinine Currently at baseline.    Anemia of chronic disease with a component of acute illness anemia:  S/p 2 units of prbc transfusion and hemoglobin around 8. Monitor intermittently.  No signs of active bleeding.   Elevated troponin: Due to demand ischemia in setting of aspiration event/sepsis. Downward trend, no acute changes on ECG.  - No further intervention planned at this time  Traumatic pneumothorax: On CXR 8/26. No mediastinal shift. Stable on 8/28.  - on 28% of oxygen . No issues.   H/o recent CVA:  Continue with aspirin and plavix  Via tube.    Severe protein energy malnutrition:  Currently on tube feeds and dietary on board.    Hypertension:  Prn labetalol.   well controlled.   Chronic leukocytosis:  Recommended outpatient follow up with a hematologist if it doesnt improve in 2 weeks.    Hypercalcemia:  PTH pending. Resolved.  Asymptomatic.     DVT prophylaxis: (Lovenox) Code Status: full code.  Family Communication:none at bedside.  Disposition Plan: SNF if afebrile for the next 24 hours.    Consultants:   PCCM.   PALLIATIVE CARE.   Procedures:Left pneumothorax needle decompression 8/25 Antimicrobials:   Vancomycin 8/25 >> 8/28  Cefepime 8/25  Zosyn 8/26 >> 8/29  augmentin.   Subjective: Thick copious secretions from the trach. Coughing.   Objective: Vitals:   12/17/16 0808 12/17/16 0834 12/17/16 1124 12/17/16 1235  BP: (!) 153/73   (!) 110/57  Pulse:  99 83 81  Resp: (!) 35  (!) 26 (!) 24  Temp: (!) 100.9 F (38.3 C)   98.7 F (37.1 C)  TempSrc: Axillary   Oral  SpO2: 98%  98% 98%  Weight:      Height:        Intake/Output Summary  (Last 24 hours) at 12/17/16 1458 Last data filed at 12/17/16 1011  Gross per 24 hour  Intake             2260 ml  Output             1325 ml  Net              935 ml   Filed Weights   12/15/16 0454 12/16/16 0435 12/17/16 0334  Weight: 66 kg (145 lb 8.1 oz) 67.6 kg (149 lb 0.5 oz) 66.8 kg (147 lb 4.3 oz)    Examination:  General exam: Appears calm and comfortable  Does not respond to verbal cues.  Neck: Trach in place. On 28% oxygen.  Respiratory system: scattered rhonchi posteriorly, no wheezing, air entry fair.  Cardiovascular system: s1s2, RRR, no JVD. No pedal edema.  Gastrointestinal system: Abdomen is soft non tender non distended bowel sounds heard Central nervous system: alert and comfortable. Able to move extremities.  Extremities: Symmetric 5 x 5 power. No pedal edema.  Psychiatry: couldn't be assessed due to his baseline diminished mental acuity. Skin:  Stage 4 pressure ulcer over the sacral area. Healing.     Data Reviewed: I have personally reviewed following labs and imaging studies  CBC:  Recent Labs Lab 12/12/16 0721 12/13/16 0601 12/14/16 0314 12/15/16 0345 12/16/16 1232  WBC 14.9* 14.0* 14.7* 17.3* 18.6*  HGB 7.5* 8.1* 8.1* 8.4* 8.2*  HCT 25.1* 26.6* 27.2* 27.3* 26.3*  MCV 93.3 94.0 93.8 93.8 91.0  PLT 511* 475* 496* 505* 161*   Basic Metabolic Panel:  Recent Labs Lab 12/12/16 0721 12/13/16 0601 12/14/16 0314 12/15/16 0345 12/16/16 0535  NA 148* 146* 145 142 141  K 3.7 3.7 3.9 4.1 4.0  CL 117* 112* 112* 106 105  CO2 23 25 26 25 27   GLUCOSE 138* 123* 103* 116* 138*  BUN 24* 26* 24* 24* 25*  CREATININE 1.49* 1.31* 1.07 0.98 0.96  CALCIUM 10.3 10.4* 10.5* 10.5* 10.2   GFR: Estimated Creatinine Clearance: 71.5 mL/min (by C-G formula based on SCr of 0.96 mg/dL). Liver Function Tests: No results for input(s): AST, ALT, ALKPHOS, BILITOT, PROT, ALBUMIN in the last 168 hours. No results for input(s): LIPASE, AMYLASE in the last 168 hours. No  results for input(s): AMMONIA in the last 168 hours. Coagulation Profile: No results for input(s): INR, PROTIME in the last 168 hours. Cardiac Enzymes: No results for input(s): CKTOTAL, CKMB, CKMBINDEX, TROPONINI in the last 168 hours. BNP (last 3 results) No results for input(s): PROBNP in the last 8760 hours. HbA1C: No results for input(s): HGBA1C in the last 72 hours. CBG:  Recent Labs Lab 12/16/16 1116 12/16/16 1956 12/16/16 2321 12/17/16 0332 12/17/16 0811  GLUCAP 143* 113* 102* 102* 109*   Lipid Profile: No results for input(s): CHOL, HDL, LDLCALC, TRIG, CHOLHDL, LDLDIRECT in the last 72 hours. Thyroid Function Tests: No results for input(s): TSH,  T4TOTAL, FREET4, T3FREE, THYROIDAB in the last 72 hours. Anemia Panel: No results for input(s): VITAMINB12, FOLATE, FERRITIN, TIBC, IRON, RETICCTPCT in the last 72 hours. Sepsis Labs: No results for input(s): PROCALCITON, LATICACIDVEN in the last 168 hours.  Recent Results (from the past 240 hour(s))  Blood Culture (routine x 2)     Status: None   Collection Time: 12/09/16  3:52 AM  Result Value Ref Range Status   Specimen Description BLOOD RIGHT FOREARM  Final   Special Requests IN PEDIATRIC BOTTLE Blood Culture adequate volume  Final   Culture NO GROWTH 5 DAYS  Final   Report Status 12/14/2016 FINAL  Final  Blood Culture (routine x 2)     Status: None   Collection Time: 12/09/16  4:00 AM  Result Value Ref Range Status   Specimen Description BLOOD LEFT HAND  Final   Special Requests IN PEDIATRIC BOTTLE Blood Culture adequate volume  Final   Culture NO GROWTH 5 DAYS  Final   Report Status 12/14/2016 FINAL  Final  MRSA PCR Screening     Status: None   Collection Time: 12/09/16 12:00 PM  Result Value Ref Range Status   MRSA by PCR NEGATIVE NEGATIVE Final    Comment:        The GeneXpert MRSA Assay (FDA approved for NASAL specimens only), is one component of a comprehensive MRSA colonization surveillance program. It  is not intended to diagnose MRSA infection nor to guide or monitor treatment for MRSA infections.   Culture, respiratory (NON-Expectorated)     Status: None   Collection Time: 12/09/16 12:30 PM  Result Value Ref Range Status   Specimen Description TRACHEAL ASPIRATE  Final   Special Requests NONE  Final   Gram Stain   Final    ABUNDANT WBC PRESENT, PREDOMINANTLY PMN FEW SQUAMOUS EPITHELIAL CELLS PRESENT FEW GRAM POSITIVE RODS RARE SQUAMOUS EPITHELIAL CELLS PRESENT RARE GRAM POSITIVE COCCI IN PAIRS    Culture   Final    MODERATE STAPHYLOCOCCUS AUREUS WITHIN MIXED CULTURE    Report Status 12/12/2016 FINAL  Final   Organism ID, Bacteria STAPHYLOCOCCUS AUREUS  Final      Susceptibility   Staphylococcus aureus - MIC*    CIPROFLOXACIN <=0.5 SENSITIVE Sensitive     ERYTHROMYCIN <=0.25 SENSITIVE Sensitive     GENTAMICIN <=0.5 SENSITIVE Sensitive     OXACILLIN <=0.25 SENSITIVE Sensitive     TETRACYCLINE 8 INTERMEDIATE Intermediate     VANCOMYCIN <=0.5 SENSITIVE Sensitive     TRIMETH/SULFA <=10 SENSITIVE Sensitive     CLINDAMYCIN <=0.25 SENSITIVE Sensitive     RIFAMPIN <=0.5 SENSITIVE Sensitive     Inducible Clindamycin NEGATIVE Sensitive     * MODERATE STAPHYLOCOCCUS AUREUS  Culture, blood (Routine X 2) w Reflex to ID Panel     Status: None (Preliminary result)   Collection Time: 12/16/16 12:32 PM  Result Value Ref Range Status   Specimen Description BLOOD RIGHT HAND  Final   Special Requests IN PEDIATRIC BOTTLE Blood Culture adequate volume  Final   Culture NO GROWTH < 24 HOURS  Final   Report Status PENDING  Incomplete  Culture, blood (Routine X 2) w Reflex to ID Panel     Status: None (Preliminary result)   Collection Time: 12/16/16 12:33 PM  Result Value Ref Range Status   Specimen Description BLOOD RIGHT ANTECUBITAL  Final   Special Requests IN PEDIATRIC BOTTLE Blood Culture adequate volume  Final   Culture NO GROWTH < 24 HOURS  Final  Report Status PENDING  Incomplete           Radiology Studies: Dg Chest Port 1 View  Result Date: 12/16/2016 CLINICAL DATA:  Fever, tracheostomy EXAM: PORTABLE CHEST 1 VIEW COMPARISON:  12/15/2007 FINDINGS: Stable tracheostomy in the mid trachea. Normal heart size and vascularity. Mild bibasilar ill-defined patchy opacities as before. Little interval change. No new collapse or consolidation. No superimposed edema, effusion or pneumothorax. Atherosclerosis of aorta. IMPRESSION: Stable mild bibasilar airspace process versus atelectasis. No significant interval change. Electronically Signed   By: Jerilynn Mages.  Shick M.D.   On: 12/16/2016 14:10        Scheduled Meds: . aspirin  325 mg Per Tube Daily  . chlorhexidine gluconate (MEDLINE KIT)  15 mL Mouth Rinse BID  . cloNIDine  0.1 mg Per Tube BID  . clopidogrel  75 mg Per Tube Daily  . enoxaparin (LOVENOX) injection  40 mg Subcutaneous Q24H  . famotidine  20 mg Per Tube BID  . feeding supplement (PRO-STAT SUGAR FREE 64)  30 mL Oral BID  . free water  200 mL Per Tube Q4H  . mouth rinse  15 mL Mouth Rinse QID  . metoCLOPramide  10 mg Per Tube TID AC & HS  . metoprolol tartrate  25 mg Per Tube BID  . pravastatin  20 mg Per Tube q1800  . senna  1 tablet Per Tube BID  . sodium chloride flush  3 mL Intravenous Q12H   Continuous Infusions: . feeding supplement (JEVITY 1.5 CAL/FIBER) 1,000 mL (12/17/16 0900)  . piperacillin-tazobactam (ZOSYN)  IV 3.375 g (12/17/16 1322)     LOS: 8 days    Time spent: 35 minutes.     Hosie Poisson, MD [facility] Pager [phone] If 7PM-7AM, please contact night-coverage [email or web address] Password [email or web address] 12/17/2016, 2:58 PM

## 2016-12-17 NOTE — Plan of Care (Signed)
Problem: Physical Regulation: Goal: Ability to maintain clinical measurements within normal limits will improve Outcome: Progressing Patient without fever overnight. Patient's secretions thick from tracheostomy; however, patient able to cough most of them up.  Goal: Will remain free from infection Outcome: Progressing Awaiting repeat blood cultures.  Problem: Skin Integrity: Goal: Risk for impaired skin integrity will decrease Outcome: Progressing Turns performed every 2 hours and foam placed under trach to protect skin in presence of frequent secretions.

## 2016-12-18 LAB — CBC
HCT: 24.1 % — ABNORMAL LOW (ref 39.0–52.0)
Hemoglobin: 7.5 g/dL — ABNORMAL LOW (ref 13.0–17.0)
MCH: 28.6 pg (ref 26.0–34.0)
MCHC: 31.1 g/dL (ref 30.0–36.0)
MCV: 92 fL (ref 78.0–100.0)
Platelets: 572 10*3/uL — ABNORMAL HIGH (ref 150–400)
RBC: 2.62 MIL/uL — ABNORMAL LOW (ref 4.22–5.81)
RDW: 16.6 % — ABNORMAL HIGH (ref 11.5–15.5)
WBC: 17.4 10*3/uL — ABNORMAL HIGH (ref 4.0–10.5)

## 2016-12-18 LAB — GLUCOSE, CAPILLARY
Glucose-Capillary: 110 mg/dL — ABNORMAL HIGH (ref 65–99)
Glucose-Capillary: 108 mg/dL — ABNORMAL HIGH (ref 65–99)
Glucose-Capillary: 138 mg/dL — ABNORMAL HIGH (ref 65–99)
Glucose-Capillary: 109 mg/dL — ABNORMAL HIGH (ref 65–99)
Glucose-Capillary: 108 mg/dL — ABNORMAL HIGH (ref 65–99)

## 2016-12-18 NOTE — Progress Notes (Signed)
Patient transferred to low air mattress bed and placed on rotation.

## 2016-12-18 NOTE — Progress Notes (Signed)
Physical Therapy Treatment Patient Details Name: Scott Rivera MRN: [id number] DOB: 04-Dec-1950 Today's Date: 12/18/2016    History of Present Illness Scott Rivera is a 66 y.o. 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 He now presents with hypoxemia fever leukocytosis and what looks like tube feeds drainage from his trach indicative of severe sepsis with aspiration pneumonia    PT Comments    Continuing work on functional mobility and activity tolerance;  Scott Rivera was awake and alert entire session; making eye contact, needing multimodal cues to attend and make eye contact with sister when she was on his Right; Sat EOB approx 5 minutes with level of assist ranging from max to min assist, posterior support for balance; continue to recommend SNF for rehab to maximize independence and safety with mobility, increase interaction with environment, and decr caregiver burden; Recommend using mechanical lift to help pt OOB to chair daily; Recommend ir mattress overlay as well.  Follow Up Recommendations  SNF;Supervision/Assistance - 24 hour     Equipment Recommendations  Hospital bed;Wheelchair (measurements PT);Wheelchair cushion (measurements PT);Other (comment) (hoyler lift)    Recommendations for Other Services       Precautions / Restrictions Precautions Precautions: Fall Precaution Comments: At risk for skin breakdown, trach, PEG Required Braces or Orthoses: Other Brace/Splint Other Brace/Splint: bil PRAFOs    Mobility  Bed Mobility Overal bed mobility: Needs Assistance Bed Mobility: Supine to Sit;Sit to Supine Rolling: +2 for physical assistance;Max assist   Supine to sit: +2 for physical assistance;Max assist Sit to supine: +2 for physical assistance;Max  assist   General bed mobility comments: Pt required max A for all bed mobility, Pt not following commands for sequencing  Transfers                    Ambulation/Gait                 Stairs            Wheelchair Mobility    Modified Rankin (Stroke Patients Only) Modified Rankin (Stroke Patients Only) Pre-Morbid Rankin Score: No symptoms Modified Rankin: Severe disability     Balance Overall balance assessment: Needs assistance Sitting-balance support: Bilateral upper extremity supported;Feet supported;Single extremity supported Sitting balance-Leahy Scale: Poor Sitting balance - Comments: once positioned, able to sit EOB with min A; Worked on activities interacting with sister, who was present the whole session; support given at elbow R and L while working on UE movement and sitting balance with sister positioned directly in front of pt (high fives, hugging); noted LUE moved more freely into passive elevation than RUE Postural control: Left lateral lean;Posterior lean                                  Cognition Arousal/Alertness: Awake/alert Behavior During Therapy: Flat affect Overall Cognitive Status: Difficult to assess Area of Impairment: Attention;Following commands;Safety/judgement;Awareness;Problem solving                               General Comments: Pt not following commands during session, communication limited during session      Exercises      General Comments General comments (skin integrity, edema, etc.): VSS  Pertinent Vitals/Pain Pain Assessment: Faces Faces Pain Scale: Hurts little more Pain Location: Grimace with ROM into flexion bilateral knees Pain Descriptors / Indicators: Grimacing;Guarding Pain Intervention(s): Monitored during session;Repositioned    Home Living                      Prior Function            PT Goals (current goals can now be found in the care plan section)  Acute Rehab PT Goals Patient Stated Goal: none stated today PT Goal Formulation: Patient unable to participate in goal setting Time For Goal Achievement: 12/27/16 Potential to Achieve Goals: Poor Progress towards PT goals: Progressing toward goals (Very slowly)    Frequency    Min 2X/week      PT Plan Current plan remains appropriate    Co-evaluation              AM-PAC PT "6 Clicks" Daily Activity  Outcome Measure  Difficulty turning over in bed (including adjusting bedclothes, sheets and blankets)?: Unable Difficulty moving from lying on back to sitting on the side of the bed? : Unable Difficulty sitting down on and standing up from a chair with arms (e.g., wheelchair, bedside commode, etc,.)?: Unable Help needed moving to and from a bed to chair (including a wheelchair)?: Total Help needed walking in hospital room?: Total Help needed climbing 3-5 steps with a railing? : Total 6 Click Score: 6    End of Session Equipment Utilized During Treatment: Oxygen (28% FiO2 ATC) Activity Tolerance: Patient limited by fatigue Patient left: in bed;with call bell/phone within reach;with family/visitor present Nurse Communication: Mobility status;Need for lift equipment;Other (comment) (requesting OOB daily, chair cushion, and air overlay) PT Visit Diagnosis: Other abnormalities of gait and mobility (R26.89);Hemiplegia and hemiparesis;Other symptoms and signs involving the nervous system (R29.898);Muscle weakness (generalized) (M62.81);Pain Hemiplegia - Right/Left: Left Hemiplegia - dominant/non-dominant: Non-dominant Hemiplegia - caused by: Cerebral infarction Pain - Right/Left:  (bilateral) Pain - part of body: Leg     Time: 9702-6378 PT Time Calculation (min) (ACUTE ONLY): 19 min  Charges:  $Therapeutic Activity: 8-22 mins                    G Codes:       Roney Marion, PT  Acute Rehabilitation Services Pager [phone] Office [place] 12/18/2016, 10:47 AM

## 2016-12-18 NOTE — Progress Notes (Signed)
Case reviewed for LOS; B Thuy Atilano RN,MHA,BSN [phone] 

## 2016-12-18 NOTE — Progress Notes (Signed)
PROGRESS NOTE    Scott MITCHELTREE  QXI:[id number] DOB: 1950-06-18 DOA: 12/09/2016 PCP: Patient, No Pcp Per    Brief Narrative: Scott Rivera is a 66 y.o. 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  He was admitted for sepsis due to aspiration pneumonia. IV fluids and vancomycin/cefepime were given for sepsis. Lactic acid normalized. Hgb dropped 9.2 > 7.8 with IV fluids, so 2u PRBCs given with improvement to 9.9. Troponin was mildly elevated but with downward trend 0.17 > 0.03. Cr has improved from 2.47 on admission. Zosyn has been continued for aspiration pneumonia and tube feeds were titrated back to 50cc/hr on 8/27. Patient is on 98% trach collar, CSW informed us that patient's oxygen has to be less than 28% to be discharged to SNF.  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  was on zosyn transitioned to augmentin via tube to complete the course, but patient had thick copious secretions coming from the trach and he was running fevers over the last 48 hours, UA Is negative, and CXR does nto show any new changes, hence changed the augmentin back to zosyn.  Pt  continues to have fevers t max of 101.1. Will need to add vancomycin to her regimen , if he continues to have fevers.  Continue with trach care and pulmonologist assisting with care.  Blood cultures negative so far.  CXR unchanged from before .      Acute on chronic respiratory failure:  Trach dependent. Currently on 28% oxygen.    Acute on CKD stage 3.   creatinine Currently at baseline.    Anemia of chronic disease with a component of acute illness anemia:  S/p 2 units of prbc transfusion and hemoglobin dropped to 7.5.  Will transfuse to keep hemoglobin greater than 7. No signs of active bleeding.   Elevated troponin: Due to demand ischemia in setting of aspiration event/sepsis. Downward trend, no acute changes on ECG.  - No further intervention planned at this time  Traumatic pneumothorax: On CXR 8/26. No mediastinal shift. Stable on 8/28.  - on 28% of oxygen . No issues.   H/o recent CVA:  Continue with aspirin and plavix  Via tube.    Severe protein energy malnutrition:  Currently on tube feeds and dietary on board.    Hypertension:  Prn labetalol.   well controlled.   Chronic leukocytosis:  Recommended outpatient follow up with a hematologist if it doesnt improve in 2 weeks.    Hypercalcemia:  PTH normal. Asymptomatic. Resolved.      DVT prophylaxis: (Lovenox) Code Status: full code.  Family Communication:none at bedside.  Disposition Plan: SNF if afebrile for the next 24 hours.  Consultants:   PCCM.   PALLIATIVE CARE.   Procedures:Left pneumothorax needle decompression 8/25 Antimicrobials:   Vancomycin 8/25 >> 8/28  Cefepime 8/25  Zosyn 8/26 >> 8/29 restarted on 9/2  augmentin.   Subjective: secretions from the trach are improving.   Objective: Vitals:   12/18/16 1121 12/18/16 1201 12/18/16 1546 12/18/16 1553  BP: 116/63 134/73  138/70  Pulse: (!) 102 95 97 96  Resp:  (!) 33 (!) 35 (!) 37  Temp:  99.9 F (37.7 C)  98.8 F  (37.1 C)  TempSrc:  Oral  Oral  SpO2:  97% 97% 95%  Weight:      Height:        Intake/Output Summary (Last 24 hours) at 12/18/16 1633 Last data filed at 12/18/16 1555  Gross per 24 hour  Intake             1353 ml  Output             1950 ml  Net             -597 ml   Filed Weights   12/16/16 0435 12/17/16 0334 12/18/16 0236  Weight: 67.6 kg (149 lb 0.5 oz) 66.8 kg (147 lb 4.3 oz) 66.9 kg (147 lb 7.8 oz)    Examination:  General exam: Appears calm and comfortable  Does not respond to verbal cues.  Not in any distress.  Neck: Trach in place. On 28% oxygen.  Respiratory system: basilar rhonchi. No wheezing air entry fair.  Cardiovascular system: s1s2, RRR, no JVD. No pedal edema.  Gastrointestinal system: Abdomen is soft NT ND BS present, G tube in place.  Central nervous system: alert and comfortable. Able to move extremities.  Extremities: Symmetric 5 x 5 power. No pedal edema.  Psychiatry: couldn't be assessed due to his baseline diminished mental acuity. Skin:  Stage 4 pressure ulcer over the sacral area. Healing.     Data Reviewed: I have personally reviewed following labs and imaging studies  CBC:  Recent Labs Lab 12/13/16 0601 12/14/16 0314 12/15/16 0345 12/16/16 1232 12/18/16 1130  WBC 14.0* 14.7* 17.3* 18.6* 17.4*  HGB 8.1* 8.1* 8.4* 8.2* 7.5*  HCT 26.6* 27.2* 27.3* 26.3* 24.1*  MCV 94.0 93.8 93.8 91.0 92.0  PLT 475* 496* 505* 483* 937*   Basic Metabolic Panel:  Recent Labs Lab 12/12/16 0721 12/13/16 0601 12/14/16 0314 12/15/16 0345 12/16/16 0535  NA 148* 146* 145 142 141  K 3.7 3.7 3.9 4.1 4.0  CL 117* 112* 112* 106 105  CO2 23 25 26 25 27   GLUCOSE 138* 123* 103* 116* 138*  BUN 24* 26* 24* 24* 25*  CREATININE 1.49* 1.31* 1.07 0.98 0.96  CALCIUM 10.3 10.4* 10.5* 10.5* 10.2  10.2   GFR: Estimated Creatinine Clearance: 71.6 mL/min (by C-G formula based on SCr of 0.96 mg/dL). Liver Function Tests: No results for input(s): AST, ALT,  ALKPHOS, BILITOT, PROT, ALBUMIN in the last 168 hours. No results for input(s): LIPASE, AMYLASE in the last 168 hours. No results for input(s): AMMONIA in the last 168 hours. Coagulation Profile: No results for input(s): INR, PROTIME in the last 168 hours. Cardiac Enzymes: No results for input(s): CKTOTAL, CKMB, CKMBINDEX, TROPONINI in the last 168 hours. BNP (last 3 results) No results for input(s): PROBNP in the last 8760 hours. HbA1C: No results for input(s): HGBA1C in the last 72 hours. CBG:  Recent Labs Lab 12/17/16 2323 12/18/16 0400 12/18/16 0724 12/18/16 1200 12/18/16 1552  GLUCAP 132* 110*  108* 138* 109*   Lipid Profile: No results for input(s): CHOL, HDL, LDLCALC, TRIG, CHOLHDL, LDLDIRECT in the last 72 hours. Thyroid Function Tests: No results for input(s): TSH, T4TOTAL, FREET4, T3FREE, THYROIDAB in the last 72 hours. Anemia Panel: No results for input(s): VITAMINB12, FOLATE, FERRITIN, TIBC, IRON, RETICCTPCT in the last 72 hours. Sepsis Labs: No results for input(s): PROCALCITON, LATICACIDVEN in the last 168 hours.  Recent Results (from the past 240 hour(s))  Blood Culture (routine x 2)     Status: None   Collection Time: 12/09/16  3:52 AM  Result Value Ref Range Status   Specimen Description BLOOD RIGHT FOREARM  Final   Special Requests IN PEDIATRIC BOTTLE Blood Culture adequate volume  Final   Culture NO GROWTH 5 DAYS  Final   Report Status 12/14/2016 FINAL  Final  Blood Culture (routine x 2)     Status: None   Collection Time: 12/09/16  4:00 AM  Result Value Ref Range Status   Specimen Description BLOOD LEFT HAND  Final   Special Requests IN PEDIATRIC BOTTLE Blood Culture adequate volume  Final   Culture NO GROWTH 5 DAYS  Final   Report Status 12/14/2016 FINAL  Final  MRSA PCR Screening     Status: None   Collection Time: 12/09/16 12:00 PM  Result Value Ref Range Status   MRSA by PCR NEGATIVE NEGATIVE Final    Comment:        The GeneXpert MRSA Assay  (FDA approved for NASAL specimens only), is one component of a comprehensive MRSA colonization surveillance program. It is not intended to diagnose MRSA infection nor to guide or monitor treatment for MRSA infections.   Culture, respiratory (NON-Expectorated)     Status: None   Collection Time: 12/09/16 12:30 PM  Result Value Ref Range Status   Specimen Description TRACHEAL ASPIRATE  Final   Special Requests NONE  Final   Gram Stain   Final    ABUNDANT WBC PRESENT, PREDOMINANTLY PMN FEW SQUAMOUS EPITHELIAL CELLS PRESENT FEW GRAM POSITIVE RODS RARE SQUAMOUS EPITHELIAL CELLS PRESENT RARE GRAM POSITIVE COCCI IN PAIRS    Culture   Final    MODERATE STAPHYLOCOCCUS AUREUS WITHIN MIXED CULTURE    Report Status 12/12/2016 FINAL  Final   Organism ID, Bacteria STAPHYLOCOCCUS AUREUS  Final      Susceptibility   Staphylococcus aureus - MIC*    CIPROFLOXACIN <=0.5 SENSITIVE Sensitive     ERYTHROMYCIN <=0.25 SENSITIVE Sensitive     GENTAMICIN <=0.5 SENSITIVE Sensitive     OXACILLIN <=0.25 SENSITIVE Sensitive     TETRACYCLINE 8 INTERMEDIATE Intermediate     VANCOMYCIN <=0.5 SENSITIVE Sensitive     TRIMETH/SULFA <=10 SENSITIVE Sensitive     CLINDAMYCIN <=0.25 SENSITIVE Sensitive     RIFAMPIN <=0.5 SENSITIVE Sensitive     Inducible Clindamycin NEGATIVE Sensitive     * MODERATE STAPHYLOCOCCUS AUREUS  Culture, blood (Routine X 2) w Reflex to ID Panel     Status: None (Preliminary result)   Collection Time: 12/16/16 12:32 PM  Result Value Ref Range Status   Specimen Description BLOOD RIGHT HAND  Final   Special Requests IN PEDIATRIC BOTTLE Blood Culture adequate volume  Final   Culture NO GROWTH 2 DAYS  Final   Report Status PENDING  Incomplete  Culture, blood (Routine X 2) w Reflex to ID Panel     Status: None (Preliminary result)   Collection Time: 12/16/16 12:33 PM  Result Value Ref Range Status   Specimen Description  BLOOD RIGHT ANTECUBITAL  Final   Special Requests IN PEDIATRIC  BOTTLE Blood Culture adequate volume  Final   Culture NO GROWTH 2 DAYS  Final   Report Status PENDING  Incomplete         Radiology Studies: No results found.      Scheduled Meds: . aspirin  325 mg Per Tube Daily  . chlorhexidine gluconate (MEDLINE KIT)  15 mL Mouth Rinse BID  . cloNIDine  0.1 mg Per Tube BID  . clopidogrel  75 mg Per Tube Daily  . enoxaparin (LOVENOX) injection  40 mg Subcutaneous Q24H  . famotidine  20 mg Per Tube BID  . feeding supplement (PRO-STAT SUGAR FREE 64)  30 mL Oral BID  . free water  200 mL Per Tube Q4H  . mouth rinse  15 mL Mouth Rinse QID  . metoCLOPramide  10 mg Per Tube TID AC & HS  . metoprolol tartrate  25 mg Per Tube BID  . pravastatin  20 mg Per Tube q1800  . senna  1 tablet Per Tube BID  . sodium chloride flush  3 mL Intravenous Q12H   Continuous Infusions: . feeding supplement (JEVITY 1.5 CAL/FIBER) 1,000 mL (12/18/16 1405)  . piperacillin-tazobactam (ZOSYN)  IV 3.375 g (12/18/16 1405)     LOS: 9 days    Time spent: 35 minutes.     Hosie Poisson, MD [facility] Pager [phone] If 7PM-7AM, please contact night-coverage [email or web address] Password [facility] 12/18/2016, 4:33 PM

## 2016-12-19 LAB — GLUCOSE, CAPILLARY
Glucose-Capillary: 131 mg/dL — ABNORMAL HIGH (ref 65–99)
Glucose-Capillary: 95 mg/dL (ref 65–99)
Glucose-Capillary: 110 mg/dL — ABNORMAL HIGH (ref 65–99)
Glucose-Capillary: 126 mg/dL — ABNORMAL HIGH (ref 65–99)
Glucose-Capillary: 146 mg/dL — ABNORMAL HIGH (ref 65–99)
Glucose-Capillary: 94 mg/dL (ref 65–99)
Glucose-Capillary: 115 mg/dL — ABNORMAL HIGH (ref 65–99)

## 2016-12-19 NOTE — Care Management Note (Signed)
Case Management Note  Patient Details  Name: Scott Rivera MRN: [id number] Date of Birth: 1950-05-26  Subjective/Objective:   From [facility], presents with Chronic Trach/peg, Sepsis, Probable recurrent Asp Pna, traumatic left ptx, has lots of secretions, on 98% trach collar with nitrogen washout.    8/31 [place], BSN-  Patient is on 40% trach collar now and for possible weekend DC, CSW following.  9/4 [place], BSN - patient back up to 35% trach collar ,will need to be at 28% trach collar for dc to snf.                 Action/Plan:   Expected Discharge Date:  12/14/16               Expected Discharge Plan:  Skilled Nursing Facility  In-House Referral:  Clinical Social Work  Discharge planning Services  CM Consult  Post Acute Care Choice:    Choice offered to:     DME Arranged:    DME Agency:     HH Arranged:    [place] Agency:     Status of Service:  Completed, signed off  If discussed at [employer], dates discussed:    Additional Comments:  Scott Mayo, RN 12/19/2016, 4:02 PM

## 2016-12-19 NOTE — Care Management Important Message (Signed)
Important Message  Patient Details  Name: Scott Rivera MRN: [id number] Date of Birth: 1950-10-26   Medicare Important Message Given:  Yes    Nathen May 12/19/2016, 10:55 AM

## 2016-12-19 NOTE — Progress Notes (Addendum)
PROGRESS NOTE    Scott Rivera  FXO:[id number] DOB: 12-19-1950 DOA: 12/09/2016 PCP: Patient, No Pcp Per    Brief Narrative: Scott Rivera is a 66 y.o. 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  He was admitted for sepsis due to aspiration pneumonia. IV fluids and vancomycin/cefepime were given for sepsis. Lactic acid normalized. Hgb dropped 9.2 > 7.8 with IV fluids, so 2u PRBCs given with improvement to 9.9. Troponin was mildly elevated but with downward trend 0.17 > 0.03. Cr has improved from 2.47 on admission. Zosyn has been continued for aspiration pneumonia and tube feeds were titrated back to 50cc/hr on 8/27. Patient is on 98% trach collar, CSW informed us that patient's oxygen has to be less than 28% to be discharged to SNF. He was weaned down to 28% and he started having thick copious secretions, and became febrile. He was put back on the IV zosyn and his oxygen requirement went up to 40%.   Assessment & Plan:   Principal Problem:   Severe sepsis with acute organ dysfunction (HCC) Active Problems:   Essential hypertension   Dysphagia, post-stroke   Tobacco abuse   Tachypnea   Hyperglycemia   Hypernatremia   Leukocytosis (leucocytosis)   Aspiration pneumonia (HCC)   Goals of care, counseling/discussion   Hypotension   Acute-on-chronic kidney injury ([place])   Elevated troponin   Anemia due to chronic kidney disease   Compensated metabolic alkalosis   Pneumothorax, closed, traumatic, initial encounter   Tracheostomy status (HCC)   Pneumothorax   Hypoxemia   Stage III pressure ulcer of sacral region ([place])   Sepsis sec to aspiration pneumonitis:  was on zosyn transitioned to augmentin via tube to complete the course, but patient had thick copious secretions coming from the  trach and he was running fevers over the weekend, Augmentin was stopped and he is back on zosyn.  UA Is negative, and CXR does not show any new changes,   His oxygen requirement also increased to 40% on the trach collar.  Continue with trach care and pulmonologist signed off.  Blood cultures negative so far.  CXR unchanged from before .   plan is to wean him down to 28% and transition to him to oral antibiotics in am if pt remains afebrile overnight, if he continues to be febrile, will need to add vancomycin to his regimen.    Acute on chronic respiratory failure:  Trach dependent. Currently on 40% oxygen.    Acute on CKD stage 3.   creatinine Currently at baseline.    Anemia of chronic disease with a component of acute illness anemia:  S/p 2 units of prbc transfusion and hemoglobin dropped to 7.5.  Will transfuse to keep hemoglobin greater than 7. No signs of active bleeding. Repeat cbc in am.   Elevated troponin: Due to demand ischemia in setting of aspiration event/sepsis. Downward trend, no acute changes on ECG.  - No further intervention planned at this time  Traumatic pneumothorax:  No mediastinal shift.  - on 40% of oxygen . No issues.   H/o recent CVA:  Continue with aspirin and plavix  Via tube.    Severe protein energy malnutrition:  Currently on tube feeds and dietary on board.    Hypertension:  Prn labetalol.   well controlled.   Chronic leukocytosis:  Recommended outpatient follow up with a hematologist if it doesnt improve in 2 weeks.    Hypercalcemia:  PTH normal. Asymptomatic. Resolved.      DVT prophylaxis: (Lovenox) Code Status: full code.  Family Communication:none at bedside.  Disposition Plan: SNF if afebrile for the next 24 hours.    Consultants:   PCCM.   PALLIATIVE CARE.   Procedures:Left pneumothorax needle decompression 8/25 Antimicrobials:   Vancomycin 8/25 >> 8/28  Cefepime 8/25  Zosyn 8/26 >> 8/29 restarted on  9/2  augmentin.   Subjective: secretions from the trach are improving. Afebrile overnight , but pt is on 40% of oxygen.    Objective: Vitals:   12/19/16 1132 12/19/16 1221 12/19/16 1545 12/19/16 1614  BP: 106/76 (!) 117/59 129/68 129/68  Pulse: 85 78 79 85  Resp: (!) 22 (!) 28 (!) 33 (!) 36  Temp:  99.1 F (37.3 C) 98.5 F (36.9 C)   TempSrc:  Axillary Axillary   SpO2: 100% 100% 96% 99%  Weight:      Height:        Intake/Output Summary (Last 24 hours) at 12/19/16 1643 Last data filed at 12/19/16 1610  Gross per 24 hour  Intake             1200 ml  Output             1450 ml  Net             -250 ml   Filed Weights   12/16/16 0435 12/17/16 0334 12/18/16 0236  Weight: 67.6 kg (149 lb 0.5 oz) 66.8 kg (147 lb 4.3 oz) 66.9 kg (147 lb 7.8 oz)    Examination:  General exam: Appears calm and comfortable  Does not respond to verbal cues.  Not in any distress.  Neck: Trach in place. On 40% oxygen.  Respiratory system: rhonchi and crackles at bases. No wheezing.  Cardiovascular system: s1s2, tachycardic, no JVD. No pedal edema.  Gastrointestinal system: Abdomen is soft non tender non distended, G tube in place.  Central nervous system: alert and comfortable. Able to move extremities.  Extremities: Symmetric 5 x 5 power. No pedal edema. Feet in boots.  Psychiatry: couldn't be assessed due to his baseline diminished mental acuity. Skin:  Stage 4 pressure ulcer over the sacral area. Healing.     Data Reviewed: I have personally reviewed following labs and imaging studies  CBC:  Recent Labs Lab 12/13/16 0601 12/14/16 0314 12/15/16 0345 12/16/16 1232 12/18/16 1130  WBC 14.0* 14.7* 17.3* 18.6* 17.4*  HGB 8.1* 8.1* 8.4* 8.2* 7.5*  HCT 26.6* 27.2* 27.3* 26.3* 24.1*  MCV 94.0 93.8 93.8 91.0 92.0  PLT 475* 496* 505* 483* 749*   Basic Metabolic Panel:  Recent Labs Lab 12/13/16 0601 12/14/16 0314 12/15/16 0345 12/16/16 0535  NA 146* 145 142 141  K 3.7 3.9 4.1  4.0  CL 112* 112* 106 105  CO2 25 26 25 27   GLUCOSE 123* 103* 116* 138*  BUN 26* 24* 24* 25*  CREATININE 1.31* 1.07 0.98 0.96  CALCIUM 10.4* 10.5* 10.5* 10.2  10.2   GFR: Estimated Creatinine Clearance: 71.6 mL/min (by C-G formula based on SCr of 0.96 mg/dL). Liver Function Tests: No results for input(s): AST, ALT, ALKPHOS, BILITOT, PROT, ALBUMIN in the last 168 hours. No results for input(s): LIPASE, AMYLASE in the last 168 hours. No results for input(s): AMMONIA in the last 168 hours. Coagulation Profile: No results for input(s): INR, PROTIME in the last 168 hours.  Cardiac Enzymes: No results for input(s): CKTOTAL, CKMB, CKMBINDEX, TROPONINI in the last 168 hours. BNP (last 3 results) No results for input(s): PROBNP in the last 8760 hours. HbA1C: No results for input(s): HGBA1C in the last 72 hours. CBG:  Recent Labs Lab 12/18/16 2324 12/19/16 0354 12/19/16 0759 12/19/16 1224 12/19/16 1547  GLUCAP 95 110* 126* 146* 94   Lipid Profile: No results for input(s): CHOL, HDL, LDLCALC, TRIG, CHOLHDL, LDLDIRECT in the last 72 hours. Thyroid Function Tests: No results for input(s): TSH, T4TOTAL, FREET4, T3FREE, THYROIDAB in the last 72 hours. Anemia Panel: No results for input(s): VITAMINB12, FOLATE, FERRITIN, TIBC, IRON, RETICCTPCT in the last 72 hours. Sepsis Labs: No results for input(s): PROCALCITON, LATICACIDVEN in the last 168 hours.  Recent Results (from the past 240 hour(s))  Culture, blood (Routine X 2) w Reflex to ID Panel     Status: None (Preliminary result)   Collection Time: 12/16/16 12:32 PM  Result Value Ref Range Status   Specimen Description BLOOD RIGHT HAND  Final   Special Requests IN PEDIATRIC BOTTLE Blood Culture adequate volume  Final   Culture NO GROWTH 3 DAYS  Final   Report Status PENDING  Incomplete  Culture, blood (Routine X 2) w Reflex to ID Panel     Status: None (Preliminary result)   Collection Time: 12/16/16 12:33 PM  Result Value Ref  Range Status   Specimen Description BLOOD RIGHT ANTECUBITAL  Final   Special Requests IN PEDIATRIC BOTTLE Blood Culture adequate volume  Final   Culture NO GROWTH 3 DAYS  Final   Report Status PENDING  Incomplete         Radiology Studies: No results found.      Scheduled Meds: . aspirin  325 mg Per Tube Daily  . chlorhexidine gluconate (MEDLINE KIT)  15 mL Mouth Rinse BID  . cloNIDine  0.1 mg Per Tube BID  . clopidogrel  75 mg Per Tube Daily  . enoxaparin (LOVENOX) injection  40 mg Subcutaneous Q24H  . famotidine  20 mg Per Tube BID  . feeding supplement (PRO-STAT SUGAR FREE 64)  30 mL Oral BID  . free water  200 mL Per Tube Q4H  . mouth rinse  15 mL Mouth Rinse QID  . metoCLOPramide  10 mg Per Tube TID AC & HS  . metoprolol tartrate  25 mg Per Tube BID  . pravastatin  20 mg Per Tube q1800  . senna  1 tablet Per Tube BID  . sodium chloride flush  3 mL Intravenous Q12H   Continuous Infusions: . feeding supplement (JEVITY 1.5 CAL/FIBER) 1,000 mL (12/19/16 0337)  . piperacillin-tazobactam (ZOSYN)  IV Stopped (12/19/16 0920)     LOS: 10 days    Time spent: 35 minutes.     Hosie Poisson, MD [facility] Pager [phone] If 7PM-7AM, please contact night-coverage [email or web address] Password [facility] 12/19/2016, 4:43 PM

## 2016-12-20 DIAGNOSIS — E870 Hyperosmolality and hypernatremia: Secondary | ICD-10-CM

## 2016-12-20 DIAGNOSIS — J189 Pneumonia, unspecified organism: Secondary | ICD-10-CM

## 2016-12-20 DIAGNOSIS — R509 Fever, unspecified: Secondary | ICD-10-CM

## 2016-12-20 DIAGNOSIS — N189 Chronic kidney disease, unspecified: Secondary | ICD-10-CM

## 2016-12-20 DIAGNOSIS — L89153 Pressure ulcer of sacral region, stage 3: Secondary | ICD-10-CM

## 2016-12-20 DIAGNOSIS — D631 Anemia in chronic kidney disease: Secondary | ICD-10-CM

## 2016-12-20 DIAGNOSIS — N183 Chronic kidney disease, stage 3 (moderate): Secondary | ICD-10-CM

## 2016-12-20 DIAGNOSIS — R748 Abnormal levels of other serum enzymes: Secondary | ICD-10-CM

## 2016-12-20 DIAGNOSIS — I10 Essential (primary) hypertension: Secondary | ICD-10-CM

## 2016-12-20 DIAGNOSIS — D72829 Elevated white blood cell count, unspecified: Secondary | ICD-10-CM

## 2016-12-20 DIAGNOSIS — R0682 Tachypnea, not elsewhere classified: Secondary | ICD-10-CM

## 2016-12-20 DIAGNOSIS — N179 Acute kidney failure, unspecified: Secondary | ICD-10-CM

## 2016-12-20 LAB — GLUCOSE, CAPILLARY
Glucose-Capillary: 102 mg/dL — ABNORMAL HIGH (ref 65–99)
Glucose-Capillary: 103 mg/dL — ABNORMAL HIGH (ref 65–99)
Glucose-Capillary: 113 mg/dL — ABNORMAL HIGH (ref 65–99)
Glucose-Capillary: 121 mg/dL — ABNORMAL HIGH (ref 65–99)
Glucose-Capillary: 121 mg/dL — ABNORMAL HIGH (ref 65–99)
Glucose-Capillary: 122 mg/dL — ABNORMAL HIGH (ref 65–99)
Glucose-Capillary: 145 mg/dL — ABNORMAL HIGH (ref 65–99)

## 2016-12-20 LAB — BASIC METABOLIC PANEL
Anion gap: 9 (ref 5–15)
BUN: 30 mg/dL — ABNORMAL HIGH (ref 6–20)
CO2: 28 mmol/L (ref 22–32)
Calcium: 10.5 mg/dL — ABNORMAL HIGH (ref 8.9–10.3)
Chloride: 101 mmol/L (ref 101–111)
Creatinine, Ser: 1.06 mg/dL (ref 0.61–1.24)
GFR calc Af Amer: >60 mL/min (ref >60)
GFR calc non Af Amer: >60 mL/min (ref >60)
Glucose, Bld: 107 mg/dL — ABNORMAL HIGH (ref 65–99)
Potassium: 4.5 mmol/L (ref 3.5–5.1)
Sodium: 138 mmol/L (ref 135–145)

## 2016-12-20 LAB — HEMOGLOBIN AND HEMATOCRIT, BLOOD
HCT: 26.4 % — ABNORMAL LOW (ref 39.0–52.0)
Hemoglobin: 8.4 g/dL — ABNORMAL LOW (ref 13.0–17.0)

## 2016-12-20 LAB — CBC
HCT: 24 % — ABNORMAL LOW (ref 39.0–52.0)
Hemoglobin: 7.3 g/dL — ABNORMAL LOW (ref 13.0–17.0)
MCH: 28.2 pg (ref 26.0–34.0)
MCHC: 30.4 g/dL (ref 30.0–36.0)
MCV: 92.7 fL (ref 78.0–100.0)
Platelets: 640 10*3/uL — ABNORMAL HIGH (ref 150–400)
RBC: 2.59 MIL/uL — ABNORMAL LOW (ref 4.22–5.81)
RDW: 16.4 % — ABNORMAL HIGH (ref 11.5–15.5)
WBC: 16.8 10*3/uL — ABNORMAL HIGH (ref 4.0–10.5)

## 2016-12-20 LAB — PREPARE RBC (CROSSMATCH)

## 2016-12-20 MED ORDER — SODIUM CHLORIDE 0.9 % IV SOLN: Freq: Once | INTRAVENOUS | Status: DC

## 2016-12-20 NOTE — Progress Notes (Signed)
Physical Therapy Treatment Patient Details Name: Scott Rivera MRN: [id number] DOB: 1951/02/01 Today's Date: 12/20/2016    History of Present Illness Scott Rivera is a 66 y.o. 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 He now presents with hypoxemia fever leukocytosis and what looks like tube feeds drainage from his trach indicative of severe sepsis with aspiration pneumonia    PT Comments    Pt with slow progress with sitting balance.   Follow Up Recommendations  SNF;Supervision/Assistance - 24 hour     Equipment Recommendations  Other (comment) (To be assessed in next venue)    Recommendations for Other Services       Precautions / Restrictions Precautions Precautions: Fall Required Braces or Orthoses: Other Brace/Splint Other Brace/Splint: bil prevalon boots Restrictions Weight Bearing Restrictions: No    Mobility  Bed Mobility Overal bed mobility: Needs Assistance Bed Mobility: Supine to Sit;Sit to Supine     Supine to sit: Total assist;HOB elevated Sit to supine: Total assist   General bed mobility comments: Assist with all aspects  Transfers                    Ambulation/Gait                 Stairs            Wheelchair Mobility    Modified Rankin (Stroke Patients Only) Modified Rankin (Stroke Patients Only) Pre-Morbid Rankin Score: No symptoms Modified Rankin: Severe disability     Balance Overall balance assessment: Needs assistance Sitting-balance support: Feet supported;Single extremity supported Sitting balance-Leahy Scale: Poor Sitting balance - Comments: Pt sat EOB x 12-15 minutes with min guard to mod assist. Postural control: Left lateral lean;Posterior lean                                   Cognition Arousal/Alertness: Awake/alert Behavior During Therapy: Flat affect Overall Cognitive Status: Difficult to assess                                 General Comments: Pt not following commands during session, communication limited during session      Exercises      General Comments General comments (skin integrity, edema, etc.): VSS      Pertinent Vitals/Pain Pain Assessment: Faces Faces Pain Scale: No hurt    Home Living                      Prior Function            PT Goals (current goals can now be found in the care plan section) Progress towards PT goals: Progressing toward goals    Frequency    Min 2X/week      PT Plan Current plan remains appropriate    Co-evaluation              AM-PAC PT "6 Clicks" Daily Activity  Outcome Measure  Difficulty turning over in bed (including adjusting bedclothes, sheets and blankets)?: Unable Difficulty moving from lying on back to sitting on the side of the bed? : Unable Difficulty sitting down on and standing up from a chair with arms (e.g., wheelchair,  bedside commode, etc,.)?: Unable Help needed moving to and from a bed to chair (including a wheelchair)?: Total Help needed walking in hospital room?: Total Help needed climbing 3-5 steps with a railing? : Total 6 Click Score: 6    End of Session Equipment Utilized During Treatment: Oxygen (trach collar) Activity Tolerance: Patient tolerated treatment well Patient left: in bed;with call bell/phone within reach;with family/visitor present Nurse Communication: Mobility status;Need for lift equipment PT Visit Diagnosis: Other abnormalities of gait and mobility (R26.89);Hemiplegia and hemiparesis;Other symptoms and signs involving the nervous system (R29.898);Muscle weakness (generalized) (M62.81) Hemiplegia - Right/Left: Left Hemiplegia - dominant/non-dominant: Non-dominant Hemiplegia - caused by: Cerebral infarction      Time: 9166-0600 PT Time Calculation (min) (ACUTE ONLY): 22 min  Charges:  $Therapeutic Activity: 8-22 mins                    G Codes:       [facility] PT [place] 12/20/2016, 1:14 PM

## 2016-12-20 NOTE — Progress Notes (Addendum)
PROGRESS NOTE    Scott Rivera  GOT:[id number] DOB: [birth year] DOA: 12/09/2016 PCP: Patient, No Pcp Per   No chief complaint on file.   Brief Narrative:  Scott Rivera a 66 y.o.malewith a history of severe cerebellar CVA 10/15/2016 with subsequent recurrent aspiration pneumonia now trach/PEG-dependent who was brought to the ED from his nursing home due to trouble breathing, hypoxia to 80%'s and decreased responsiveness.  He was admitted for sepsis due to aspiration pneumonia. IV fluids and vancomycin/cefepime were given for sepsis. Lactic acid normalized. Hgb dropped 9.2 >7.8 with IV fluids, so 2u PRBCs given with improvement to 9.9. Troponin was mildly elevated but with downward trend 0.17 >0.03. Cr has improved from 2.47 on admission. Zosyn has been continued for aspiration pneumonia and tube feeds were titrated back to 50cc/hr on 8/27. Patient is on 98% trach collar, CSW informed us that patient's oxygen has to be less than 28% to be discharged to SNF. He was weaned down to 28% and he started having thick copious secretions, and became febrile. He was put back on the IV zosyn and his oxygen requirement went up to 40%.  Assessment & Plan   Sepsis secondary to aspiration pneumonitis -Presented with leukocytosis, tachycardia, tachypnea -Patient was placed on zoysn and transitioned to Augmentin, however, began to have spike fevers and was placed back on zosyn -Continues to have thick copious secretions coming from trach -Currently using trach collar, back on 5 L -PCCM was consulted and has signed off -Blood cultures show no growth to date -Patient remained afebrile for the past 24 hours  Acute on chronic respiratory failure -Currently trach dependent  Acute kidney injury on chronic disease, stage III -Creatinine up to 1.88 on admission, back down to 1.06 -Was placed on IV fluids, likely secondary to sepsis -Continue to monitor BMP  Anemia of chronic disease/acute  illness -Patient received 2 units PRBC as his hemoglobin dropped 7.5 -Hemoglobin currently 7.3, will transfuse 1 unit PRBC -Continue to monitor CBC  Elevated troponin -Likely demand ischemia in the setting of aspiration pneumonia and sepsis -Troponin trended downward, no acute changes noted on EKG  Traumatic pneumothorax -No mediastinal shift -Early back on 5 L, per trach collar  History of CVA -Continue Plavix and aspirin, statin -PT consulted, recommended SNF  Severe protein malnutrition -Continue PEG feeds -Nutrition consulted  Essential hypertension -Continue clonidine, metoprolol  Chronic leukocytosis -Upon review of Epic, patient had elevated leukocytosis since 10/19/2016 -May want to follow-up with oncology/hematology as an outpatient  Hypercalcemia -PTH WNL -Currently appears to be asymptomatic, resolved  Goals of care -Palliative care consulted and appreciated, discussed with patient's sister, patient remains full code at this point.  DVT Prophylaxis  Lovenox  Code Status: Full  Family Communication: None at bedside  Disposition Plan: Admitted, if patient remains afebrile and remains on 5 L, possible discharge to SNF on 9/6  Consultants PCCM Palliative care  Procedures  Left pneumothorax needle decompression 12/09/16  Antibiotics   Anti-infectives    Start     Dose/Rate Route Frequency Ordered Stop   12/17/16 1230  piperacillin-tazobactam (ZOSYN) IVPB 3.375 g     3.375 g 12.5 mL/hr over 240 Minutes Intravenous Every 8 hours 12/17/16 1154     12/14/16 0000  amoxicillin-clavulanate (AUGMENTIN) 250-62.5 MG/5ML suspension     500 mg Per Tube Every 12 hours 12/14/16 1257 12/20/16 2359   12/13/16 1500  amoxicillin-clavulanate (AUGMENTIN) 250-62.5 MG/5ML suspension 500 mg  Status:  Discontinued     500 mg  Per Tube Every 12 hours 12/13/16 1338 12/17/16 1146   12/12/16 0600  vancomycin (VANCOCIN) IVPB 1000 mg/200 mL premix  Status:  Discontinued     1,000  mg 200 mL/hr over 60 Minutes Intravenous Every 24 hours 12/11/16 1222 12/12/16 1204   12/10/16 2200  ceFEPIme (MAXIPIME) 1 g in dextrose 5 % 50 mL IVPB  Status:  Discontinued     1 g 100 mL/hr over 30 Minutes Intravenous Every 8 hours 12/10/16 1245 12/10/16 1435   12/10/16 1500  piperacillin-tazobactam (ZOSYN) IVPB 3.375 g  Status:  Discontinued     3.375 g 12.5 mL/hr over 240 Minutes Intravenous Every 8 hours 12/10/16 1444 12/13/16 1338   12/10/16 0400  vancomycin (VANCOCIN) IVPB 1000 mg/200 mL premix  Status:  Discontinued     1,000 mg 200 mL/hr over 60 Minutes Intravenous Every 24 hours 12/09/16 0644 12/11/16 1124   12/10/16 0400  ceFEPIme (MAXIPIME) 2 g in dextrose 5 % 50 mL IVPB  Status:  Discontinued     2 g 100 mL/hr over 30 Minutes Intravenous Every 24 hours 12/09/16 0644 12/10/16 1245   12/09/16 0400  ceFEPIme (MAXIPIME) 2 g in dextrose 5 % 50 mL IVPB     2 g 100 mL/hr over 30 Minutes Intravenous  Once 12/09/16 0349 12/09/16 0434   12/09/16 0400  vancomycin (VANCOCIN) IVPB 1000 mg/200 mL premix  Status:  Discontinued     1,000 mg 200 mL/hr over 60 Minutes Intravenous  Once 12/09/16 0349 12/09/16 0358   12/09/16 0400  vancomycin (VANCOCIN) 1,250 mg in sodium chloride 0.9 % 250 mL IVPB     1,250 mg 166.7 mL/hr over 90 Minutes Intravenous  Once 12/09/16 0358 12/09/16 0553      Subjective:   Scott Rivera seen and examined today.  Not very interactive.   Objective:   Vitals:   12/20/16 0737 12/20/16 0900 12/20/16 1101 12/20/16 1144  BP: 135/62 118/66 (!) 127/50 137/67  Pulse: 99 91  90  Resp: (!) 30 (!) 27    Temp:  99 F (37.2 C)  98.6 F (37 C)  TempSrc:    Oral  SpO2: 96% 96%  98%  Weight:      Height:        Intake/Output Summary (Last 24 hours) at 12/20/16 1213 Last data filed at 12/20/16 7342  Gross per 24 hour  Intake              200 ml  Output             1050 ml  Net             -850 ml   Filed Weights   12/16/16 0435 12/17/16 0334 12/18/16 0236   Weight: 67.6 kg (149 lb 0.5 oz) 66.8 kg (147 lb 4.3 oz) 66.9 kg (147 lb 7.8 oz)   Exam  General: Well developed, Chronically ill-appearing, no apparent distress  HEENT: NCAT, mucous membranes moist.   Neck: Trach collar in palce  Cardiovascular: S1 S2 auscultated, RRR  Respiratory: Coarse breath sounds  Abdomen: Soft, nontender, nondistended, + bowel sounds, G tube in place  Extremities: warm dry without cyanosis clubbing or edema  Neuro: Unable to assess, patient does not respond to verbal cues. Moves extremities   Data Reviewed: I have personally reviewed following labs and imaging studies  CBC:  Recent Labs Lab 12/14/16 0314 12/15/16 0345 12/16/16 1232 12/18/16 1130 12/20/16 0358  WBC 14.7* 17.3* 18.6* 17.4* 16.8*  HGB 8.1* 8.4*  8.2* 7.5* 7.3*  HCT 27.2* 27.3* 26.3* 24.1* 24.0*  MCV 93.8 93.8 91.0 92.0 92.7  PLT 496* 505* 483* 572* 640*   Basic Metabolic Panel:  Recent Labs Lab 12/14/16 0314 12/15/16 0345 12/16/16 0535 12/20/16 0358  NA 145 142 141 138  K 3.9 4.1 4.0 4.5  CL 112* 106 105 101  CO2 26 25 27 28  GLUCOSE 103* 116* 138* 107*  BUN 24* 24* 25* 30*  CREATININE 1.07 0.98 0.96 1.06  CALCIUM 10.5* 10.5* 10.2  10.2 10.5*   GFR: Estimated Creatinine Clearance: 64.9 mL/min (by C-G formula based on SCr of 1.06 mg/dL). Liver Function Tests: No results for input(s): AST, ALT, ALKPHOS, BILITOT, PROT, ALBUMIN in the last 168 hours. No results for input(s): LIPASE, AMYLASE in the last 168 hours. No results for input(s): AMMONIA in the last 168 hours. Coagulation Profile: No results for input(s): INR, PROTIME in the last 168 hours. Cardiac Enzymes: No results for input(s): CKTOTAL, CKMB, CKMBINDEX, TROPONINI in the last 168 hours. BNP (last 3 results) No results for input(s): PROBNP in the last 8760 hours. HbA1C: No results for input(s): HGBA1C in the last 72 hours. CBG:  Recent Labs Lab 12/19/16 1950 12/20/16 0020 12/20/16 0438  12/20/16 0753 12/20/16 1156  GLUCAP 115* 121* 102* 121* 145*   Lipid Profile: No results for input(s): CHOL, HDL, LDLCALC, TRIG, CHOLHDL, LDLDIRECT in the last 72 hours. Thyroid Function Tests: No results for input(s): TSH, T4TOTAL, FREET4, T3FREE, THYROIDAB in the last 72 hours. Anemia Panel: No results for input(s): VITAMINB12, FOLATE, FERRITIN, TIBC, IRON, RETICCTPCT in the last 72 hours. Urine analysis:    Component Value Date/Time   COLORURINE AMBER (A) 12/16/2016 1625   APPEARANCEUR CLEAR 12/16/2016 1625   LABSPEC 1.018 12/16/2016 1625   PHURINE 5.0 12/16/2016 1625   GLUCOSEU NEGATIVE 12/16/2016 1625   HGBUR NEGATIVE 12/16/2016 1625   BILIRUBINUR NEGATIVE 12/16/2016 1625   KETONESUR NEGATIVE 12/16/2016 1625   PROTEINUR 30 (A) 12/16/2016 1625   NITRITE NEGATIVE 12/16/2016 1625   LEUKOCYTESUR NEGATIVE 12/16/2016 1625   Sepsis Labs: @LABRCNTIP(procalcitonin:4,lacticidven:4)  ) Recent Results (from the past 240 hour(s))  Culture, blood (Routine X 2) w Reflex to ID Panel     Status: None (Preliminary result)   Collection Time: 12/16/16 12:32 PM  Result Value Ref Range Status   Specimen Description BLOOD RIGHT HAND  Final   Special Requests IN PEDIATRIC BOTTLE Blood Culture adequate volume  Final   Culture NO GROWTH 4 DAYS  Final   Report Status PENDING  Incomplete  Culture, blood (Routine X 2) w Reflex to ID Panel     Status: None (Preliminary result)   Collection Time: 12/16/16 12:33 PM  Result Value Ref Range Status   Specimen Description BLOOD RIGHT ANTECUBITAL  Final   Special Requests IN PEDIATRIC BOTTLE Blood Culture adequate volume  Final   Culture NO GROWTH 4 DAYS  Final   Report Status PENDING  Incomplete      Radiology Studies: No results found.   Scheduled Meds: . aspirin  325 mg Per Tube Daily  . chlorhexidine gluconate (MEDLINE KIT)  15 mL Mouth Rinse BID  . cloNIDine  0.1 mg Per Tube BID  . clopidogrel  75 mg Per Tube Daily  . enoxaparin  (LOVENOX) injection  40 mg Subcutaneous Q24H  . famotidine  20 mg Per Tube BID  . feeding supplement (PRO-STAT SUGAR FREE 64)  30 mL Oral BID  . free water  200 mL Per Tube   Q4H  . mouth rinse  15 mL Mouth Rinse QID  . metoCLOPramide  10 mg Per Tube TID AC & HS  . metoprolol tartrate  25 mg Per Tube BID  . pravastatin  20 mg Per Tube q1800  . senna  1 tablet Per Tube BID  . sodium chloride flush  3 mL Intravenous Q12H   Continuous Infusions: . sodium chloride    . feeding supplement (JEVITY 1.5 CAL/FIBER) 1,000 mL (12/20/16 0025)  . piperacillin-tazobactam (ZOSYN)  IV 3.375 g (12/20/16 0512)     LOS: 11 days   Time Spent in minutes   30 minutes  ,  D.O. on 12/20/2016 at 12:13 PM  Between 7am to 7pm - Pager - [phone]  After 7pm go to [email or web address] - password [email or web address]  And look for the night coverage person covering for me after hours  [facility] Office  [phone]  

## 2016-12-20 NOTE — Progress Notes (Signed)
CSW faxed updates to [facility]) to get updated authorization for possible DC 9/6 if patient remains stable on 28% trach collar  Awaiting determination  Jorge Ny, [place] Social Worker [phone]

## 2016-12-21 DIAGNOSIS — E873 Alkalosis: Secondary | ICD-10-CM

## 2016-12-21 DIAGNOSIS — Z09 Encounter for follow-up examination after completed treatment for conditions other than malignant neoplasm: Secondary | ICD-10-CM

## 2016-12-21 DIAGNOSIS — R0603 Acute respiratory distress: Secondary | ICD-10-CM

## 2016-12-21 DIAGNOSIS — R509 Fever, unspecified: Secondary | ICD-10-CM

## 2016-12-21 DIAGNOSIS — Z720 Tobacco use: Secondary | ICD-10-CM

## 2016-12-21 DIAGNOSIS — J189 Pneumonia, unspecified organism: Secondary | ICD-10-CM

## 2016-12-21 DIAGNOSIS — R739 Hyperglycemia, unspecified: Secondary | ICD-10-CM

## 2016-12-21 DIAGNOSIS — I959 Hypotension, unspecified: Secondary | ICD-10-CM

## 2016-12-21 LAB — BASIC METABOLIC PANEL
Anion gap: 11 (ref 5–15)
BUN: 29 mg/dL — ABNORMAL HIGH (ref 6–20)
CO2: 25 mmol/L (ref 22–32)
Calcium: 10.5 mg/dL — ABNORMAL HIGH (ref 8.9–10.3)
Chloride: 99 mmol/L — ABNORMAL LOW (ref 101–111)
Creatinine, Ser: 0.98 mg/dL (ref 0.61–1.24)
GFR calc Af Amer: >60 mL/min (ref >60)
GFR calc non Af Amer: >60 mL/min (ref >60)
Glucose, Bld: 116 mg/dL — ABNORMAL HIGH (ref 65–99)
Potassium: 5.1 mmol/L (ref 3.5–5.1)
Sodium: 135 mmol/L (ref 135–145)

## 2016-12-21 LAB — CULTURE, BLOOD (ROUTINE X 2)
Culture: NO GROWTH
Culture: NO GROWTH
Special Requests: ADEQUATE
Special Requests: ADEQUATE

## 2016-12-21 LAB — TYPE AND SCREEN
ABO/RH(D): O POS
Antibody Screen: NEGATIVE
Unit division: 0

## 2016-12-21 LAB — BPAM RBC
Blood Product Expiration Date: 201809122359
ISSUE DATE / TIME: 201809051352
Unit Type and Rh: 5100

## 2016-12-21 LAB — CBC
HCT: 27.9 % — ABNORMAL LOW (ref 39.0–52.0)
Hemoglobin: 8.7 g/dL — ABNORMAL LOW (ref 13.0–17.0)
MCH: 28.2 pg (ref 26.0–34.0)
MCHC: 31.2 g/dL (ref 30.0–36.0)
MCV: 90.6 fL (ref 78.0–100.0)
Platelets: 692 10*3/uL — ABNORMAL HIGH (ref 150–400)
RBC: 3.08 MIL/uL — ABNORMAL LOW (ref 4.22–5.81)
RDW: 17.3 % — ABNORMAL HIGH (ref 11.5–15.5)
WBC: 17.2 10*3/uL — ABNORMAL HIGH (ref 4.0–10.5)

## 2016-12-21 LAB — GLUCOSE, CAPILLARY
Glucose-Capillary: 121 mg/dL — ABNORMAL HIGH (ref 65–99)
Glucose-Capillary: 102 mg/dL — ABNORMAL HIGH (ref 65–99)
Glucose-Capillary: 129 mg/dL — ABNORMAL HIGH (ref 65–99)

## 2016-12-21 MED ORDER — AMLODIPINE BESYLATE 10 MG PO TABS: 5.0000 mg | ORAL_TABLET | Freq: Every day | ORAL | Status: DC

## 2016-12-21 NOTE — Social Work (Addendum)
Clinical Social Worker facilitated patient discharge including contacting patient family and facility to confirm patient discharge plans.  Clinical information faxed to facility and family agreeable with plan.    CSW arranged ambulance transport via [facility] to [facility].    RN to call [phone] to give report prior to discharge.   Clinical Social Worker will sign off for now as social work intervention is no longer needed. Please consult us again if new need arises.  Khylee Algeo, LCSW Clinical Social Worker [phone]    

## 2016-12-21 NOTE — Progress Notes (Signed)
Called report to [facility] spoke to [place] LPN         aware that pt will come [facility] [employer]

## 2016-12-21 NOTE — Social Work (Addendum)
CSW spoke with [facility] and advised that the DC summary is up on the hub. SNF will review DC summary and f/u for transition to facility.  Bernadene Bell confirmed new [id number] approved with review date on 12/23/16, rvc, 590 min week.  Elissa Hefty, LCSW Clinical Social Worker [phone]

## 2016-12-21 NOTE — Clinical Social Work Placement (Signed)
   CLINICAL SOCIAL WORK PLACEMENT  NOTE  Date:  12/21/2016  Patient Details  Name: Scott Rivera MRN: [id number] Date of Birth: [birth year]  Clinical Social Work is seeking post-discharge placement for this patient at the [place] level of care (*CSW will initial, date and re-position this form in  chart as items are completed):  Yes   Patient/family provided with [place] Work Department's list of facilities offering this level of care within the geographic area requested by the patient (or if unable, by the patient's family).  Yes   Patient/family informed of their freedom to choose among providers that offer the needed level of care, that participate in Medicare, Medicaid or managed care program needed by the patient, have an available bed and are willing to accept the patient.  Yes   Patient/family informed of [facility]'s ownership interest in [facility] and [facility], as well as of the fact that they are under no obligation to receive care at these facilities.  PASRR submitted to EDS on       PASRR number received on 12/12/16     Existing PASRR number confirmed on       FL2 transmitted to all facilities in geographic area requested by pt/family on 12/21/16     FL2 transmitted to all facilities within larger geographic area on       Patient informed that his/her managed care company has contracts with or will negotiate with certain facilities, including the following:        Yes   Patient/family informed of bed offers received.  Patient chooses bed at [facility]     Physician recommends and patient chooses bed at      Patient to be transferred to [facility] on 12/21/16.  Patient to be transferred to facility by [facility]     Patient family notified on 12/21/16 of transfer.  Name of family member notified:  sister at bedside     PHYSICIAN Please prepare priority discharge summary, including medications,  Please prepare prescriptions, Please sign FL2     Additional Comment:    _______________________________________________ Normajean Baxter, LCSW 12/21/2016, 12:40 PM

## 2016-12-21 NOTE — Discharge Instructions (Signed)
Aspiration Pneumonia °Aspiration pneumonia is an infection in your lungs. It occurs when food, liquid, or stomach contents (vomit) are inhaled (aspirated) into your lungs. When these things get into your lungs, swelling (inflammation) and infection can occur. This can make it difficult for you to breathe. Aspiration pneumonia is a serious condition and can be life threatening. °What increases the risk? °Aspiration pneumonia is more likely to occur when a person's cough (gag) reflex or ability to swallow has been decreased. Some things that can do this include: °· Having a brain injury or disease, such as stroke, seizures, Parkinson's disease, dementia, or amyotrophic lateral sclerosis (ALS). °· Being given general anesthetic for procedures. °· Being in a coma (unconscious). °· Having a narrowing of the tube that carries food to the stomach (esophagus). °· Drinking too much alcohol. If a person passes out and vomits, vomit can be swallowed into the lungs. °· Taking certain medicines, such as tranquilizers or sedatives. ° °What are the signs or symptoms? °· Coughing after swallowing food or liquids. °· Breathing problems, such as wheezing or shortness of breath. °· Bluish skin. This can be caused by lack of oxygen. °· Coughing up food or mucus. The mucus might contain blood, greenish material, or yellowish-white fluid (pus). °· Fever. °· Chest pain. °· Being more tired than usual (fatigue). °· Sweating more than usual. °· Bad breath. °How is this diagnosed? °A physical exam will be done. During the exam, the health care provider will listen to your lungs with a stethoscope to check for: °· Crackling sounds in the lungs. °· Decreased breath sounds. °· A rapid heartbeat. ° °Various tests may be ordered. These may include: °· Chest X-ray. °· CT scan. °· Swallowing study. This test looks at how food is swallowed and whether it goes into your breathing tube (trachea) or food pipe (esophagus). °· Sputum culture. Saliva and  mucus (sputum) are collected from the lungs or the tubes that carry air to the lungs (bronchi). The sputum is then tested for bacteria. °· Bronchoscopy. This test uses a flexible tube (bronchoscope) to see inside the lungs. ° °How is this treated? °Treatment will usually include antibiotic medicines. Other medicines may also be used to reduce fever or pain. You may need to be treated in the hospital. In the hospital, your breathing will be carefully monitored. Depending on how well you are breathing, you may need to be given oxygen, or you may need breathing support from a breathing machine (ventilator). For people who fail a swallowing study, a feeding tube might be placed in the stomach, or they may be asked to avoid certain food textures or liquids when they eat. °Follow these instructions at home: °· Carefully follow any special eating instructions you were given, such as avoiding certain food textures or thickening liquids. This reduces the risk of developing aspiration pneumonia again. °· Only take over-the-counter or prescription medicines as directed by your health care provider. Follow the directions carefully. °· If you were prescribed antibiotics, take them as directed. Finish them even if you start to feel better. °· Rest as instructed by your health care provider. °· Keep all follow-up appointments with your health care provider. °Contact a health care provider if: °· You develop worsening shortness of breath, wheezing, or difficulty breathing. °· You develop a fever. °· You have chest pain. °This information is not intended to replace advice given to you by your health care provider. Make sure you discuss any questions you have with your health care   provider. °Document Released: 01/29/2009 Document Revised: 09/15/2015 Document Reviewed: 09/19/2012 °Elsevier Interactive Patient Education © 2017 [employer] ° °

## 2016-12-21 NOTE — Progress Notes (Signed)
Pt transferred to [facility] care [facility] aware of move

## 2016-12-21 NOTE — Social Work (Signed)
Pt's sister returned to hospital to speak with CSW about [facility] placement. CSW validated her concerns as she felt that we did not help her with placement appropriately. She indicated that she does not want patient at SNF.  CSW reiterated that CSW confirmed [facility] placement with patient's sister earlier today at bedside and she agreed. CSW also, reminded her that she confirmed air mattress with SNF. Family member in agreement,but was under the impression she was getting a private room.  CSW validated and indicated that SNF was already selected and chosen prior to CSW getting involved and CSW confirmed placement based on notes and her response. Family member did not share request for private room as everything was clarified yesterday or earlier this week. CSW validated family member's concerns.  CSW encouraged patient to follow up with admissions at SNF if she would like to change SNF's.  Family member indicated that her biggest concern was a private room.    CSW called Juliann Pulse in admissions who advised that they had a private room initially however they spoke with patient family member yesterday and advised that they could not hold private room and that patient will have to get semi private.  Family member did not share this with CSW.  CSW encouraged family member to work with admissions staff at [facility] to get transferred to another SNF. CSW advised the patient to visit the SNF that she would like and see if they can assist with SNF placement.  CSW signing off for now.  Elissa Hefty, LCSW Clinical Social Worker [phone]

## 2016-12-21 NOTE — Discharge Planning (Signed)
Physician Discharge Summary  Scott Rivera NAT:[id number] DOB: 05-Feb-1951 DOA: 12/09/2016  PCP: Patient, No Pcp Per  Admit date: 12/09/2016 Discharge date: 12/21/2016  Time spent: 45 minutes  Recommendations for Outpatient Follow-up:  Patient will be discharged to skilled nursing facility. Continue physical, occupational, and speech therapy.  Patient will need to follow up with primary care provider within one week of discharge, repeat CBC and BMP.  Patient should continue medications as prescribed.  Patient should continue peg tube feedings.  Discharge Diagnoses:  Sepsis secondary to aspiration pneumonitis Acute on chronic respiratory failure Acute kidney injury on chronic disease, stage III Anemia of chronic disease/acute illness Elevated troponin Traumatic pneumothorax History of CVA Severe protein malnutrition Essential hypertension Chronic leukocytosis Hypercalcemia Goals of care  Discharge Condition: Stable  Diet recommendation: Peg tube, jevity  Filed Weights   12/16/16 0435 12/17/16 0334 12/18/16 0236  Weight: 67.6 kg (149 lb 0.5 oz) 66.8 kg (147 lb 4.3 oz) 66.9 kg (147 lb 7.8 oz)    History of present illness:  On 12/09/2016 by Dr. Randa Spike Scott Rivera is a 66 y.o. 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 He now presents with hypoxemia fever leukocytosis and what looks like tube feeds drainage from his trach indicative of severe sepsis with aspiration pneumonia.  He was initially admitted to [facility] on July 1 and transferred to our facility on July 5 and discharged on August 18. During that hospitalization he had difficulties with kidney injury, hypernatremia, and severe leukocytosis. At discharge his white blood cell count was  21. Thought to be due to underlying problem with leukocytosis and a hematology appointment was arranged. Please see below for further discussion of past medical history.  Although the history is obtained from the patient's sister and the ER physician and the record. Patient is unable to provide any history whatsoever.  Today, Scott Rivera was found unresponsive in the early hours of this morning at the nursing facility with sats in the 80s. EMS was called and they concurred with the evaluation from the nursing facility on route he was noted to have absent breath sounds on the left and a needle decompression was performed on route. Nursing home staff reported to EMS that they had suctioned what appeared to be tube feeding out of his trach. On arrival to the emergency department he was found to be febrile to 101, he had a heart rate of 152, his blood pressure was 119/55 and his respiratory rate was 38. He received 2 L of fluid resuscitation and his blood pressure dropped to 96/54 but then started to improve.  Hospital Course:  Sepsis secondary to aspiration pneumonitis -Presented with leukocytosis, tachycardia, tachypnea -Patient was placed on zoysn and transitioned to Augmentin, however, began to have spike fevers and was placed back on zosyn -Continues to have thick copious secretions coming from trach -Currently using trach collar, back on 5 L- FiO2 28 and maintaining oxygen saturations in the 90s -Patient needs to be frequently suctioned. Signs to look for are tachypnea and decreased O2 saturations. Once patient is suctionined, O2 saturations and respiratory rate improve -PCCM was consulted and has signed off -Blood cultures show no growth to date -Patient remained afebrile for the past 48 hours -will discharge with unasyn  Acute on chronic respiratory failure -Currently trach dependent  Acute kidney injury on chronic disease, stage III -Creatinine up to 1.88 on admission, back down to  0.98 -Was placed on IV fluids, likely secondary to sepsis -repeat BMP in one week  Anemia of chronic disease/acute illness -hemoglobin dropped to 7.3, was transfused 1u on 12/20/2016 -has received 3u total this admission -currently hemoglobin 8.7 -repeat CBC in one week  Elevated troponin -Likely demand ischemia in the setting of aspiration pneumonia and sepsis -Troponin trended downward, no acute changes noted on EKG  Traumatic pneumothorax -No mediastinal shift  -Currently back on 5 L, per trach collar an maintaining O2 sats in the 90s  History of CVA -Continue Plavix and aspirin, statin -PT consulted, recommended SNF  Severe protein malnutrition -Continue PEG feeds -Nutrition consulted  Essential hypertension -Continue clonidine, metoprolol  Chronic leukocytosis -Upon review of Epic, patient had elevated leukocytosis since 10/19/2016 -May want to follow-up with oncology/hematology as an outpatient  Hypercalcemia -PTH WNL -Currently appears to be asymptomatic, resolved  Goals of care -Palliative care consulted and appreciated, discussed with patient's sister, patient remains full code at this point.  Procedures: Left pneumothorax needle decompression 12/09/16  Consultations: PCCM Palliative care  Discharge Exam: Vitals:   12/21/16 0753 12/21/16 0816  BP: 131/79 (!) 144/78  Pulse: 100 (!) 105  Resp: (!) 32 (!) 28  Temp: 98.1 F (36.7 C)   SpO2: 97% 93%     General: Well developed, chronically ill appearing, no distress  HEENT: NCAT,  mucous membranes moist.  Neck: Trach collar in place  Cardiovascular: S1 S2 auscultated, RRR  Respiratory:Coarse breath sounds, particularly upper airway (needs frequent suctioning)   Abdomen: Soft, nontender, nondistended, + bowel sounds, peg in place  Extremities: warm dry without cyanosis clubbing or edema  Neuro: awake and alert, cannot assess orientation. Moves extremities  Discharge  Instructions Discharge Instructions    Diet - low sodium heart healthy    Complete by:  As directed    Discharge instructions    Complete by:  As directed    PLEASE FOLLOW UP with PCP in one week.  Please check CBC in one week, if wbc count still elevated, will need referral to hematologist.   Discharge instructions    Complete by:  As directed    Patient will be discharged to skilled nursing facility. Continue physical, occupational, and speech therapy.  Patient will need to follow up with primary care provider within one week of discharge, repeat CBC and BMP.  Patient should continue medications as prescribed.  Patient should continue peg tube feedings. Patient will need to be suctioned frequently/as needed.     Current Discharge Medication List    START taking these medications   Details  amoxicillin-clavulanate (AUGMENTIN) 250-62.5 MG/5ML suspension Place 10 mLs (500 mg total) into feeding tube every 12 (twelve) hours. Qty: 150 mL, Refills: 0    senna (SENOKOT) 8.6 MG TABS tablet Place 1 tablet (8.6 mg total) into feeding tube 2 (two) times daily. Qty: 120 each, Refills: 0      CONTINUE these medications which have CHANGED   Details  amLODipine (NORVASC) 10 MG tablet Place 0.5 tablets (5 mg total) into feeding tube daily.    Nutritional Supplements (FEEDING SUPPLEMENT, JEVITY 1.5 CAL/FIBER,) LIQD Place 1,000 mLs into feeding tube continuous.    traMADol (ULTRAM) 50 MG tablet Take 1 tablet (50 mg total) by mouth 2 (two) times daily as needed for severe pain. Qty: 6 tablet, Refills: 0      CONTINUE these medications which have  NOT CHANGED   Details  Amino Acids-Protein Hydrolys (FEEDING SUPPLEMENT, PRO-STAT SUGAR FREE 64,) LIQD Place 30 mLs into feeding tube 3 (three) times daily.    aspirin 325 MG tablet Place 1 tablet (325 mg total) into feeding tube daily. Qty: 30 tablet, Refills: 2    bethanechol (URECHOLINE) 10 MG tablet Place 1 tablet (10 mg total) into feeding tube 3  (three) times daily. Qty: 30 tablet, Refills: 0    cloNIDine (CATAPRES) 0.1 MG tablet Place 1 tablet (0.1 mg total) into feeding tube 2 (two) times daily.    clopidogrel (PLAVIX) 75 MG tablet Place 1 tablet (75 mg total) into feeding tube daily. Qty: 30 tablet, Refills: 1    famotidine (PEPCID) 40 MG/5ML suspension Place 2.5 mLs (20 mg total) into feeding tube 2 (two) times daily.    gabapentin (NEURONTIN) 100 MG capsule Take 200 mg by mouth at bedtime.    metoCLOPramide (REGLAN) 5 MG/5ML solution Place 10 mLs (10 mg total) into feeding tube 4 (four) times daily -  before meals and at bedtime.    metoprolol tartrate (LOPRESSOR) 25 mg/10 mL SUSP Place 10 mLs (25 mg total) into feeding tube 2 (two) times daily. Qty: 20 mL, Refills: 0    Multiple Vitamin (MULTIVITAMIN WITH MINERALS) TABS tablet Place 1 tablet into feeding tube daily.    nicotine (NICODERM CQ - DOSED IN MG/24 HOURS) 14 mg/24hr patch Place 1 patch (14 mg total) onto the skin daily. Qty: 28 patch, Refills: 0    pravastatin (PRAVACHOL) 20 MG tablet Place 1 tablet (20 mg total) into feeding tube daily at 6 PM. Qty: 30 tablet, Refills: 1    thiamine 100 MG tablet Place 1 tablet (100 mg total) into feeding tube daily.    Water For Irrigation, Sterile (FREE WATER) SOLN Place 300 mLs into feeding tube every 4 (four) hours. Qty: 1000 mL, Refills: 0       No Known Allergies Follow-up Information    Primary care physician,at SNF Follow up in 1 day(s).   Why:  Hospital follow up           The results of significant diagnostics from this hospitalization (including imaging, microbiology, ancillary and laboratory) are listed below for reference.    Significant Diagnostic Studies: Dg Chest Port 1 View  Result Date: 12/16/2016 CLINICAL DATA:  Fever, tracheostomy EXAM: PORTABLE CHEST 1 VIEW COMPARISON:  12/15/2007 FINDINGS: Stable tracheostomy in the mid trachea. Normal heart size and vascularity. Mild bibasilar ill-defined  patchy opacities as before. Little interval change. No new collapse or consolidation. No superimposed edema, effusion or pneumothorax. Atherosclerosis of aorta. IMPRESSION: Stable mild bibasilar airspace process versus atelectasis. No significant interval change. Electronically Signed   By: Jerilynn Mages.  Shick M.D.   On: 12/16/2016 14:10   Dg Chest Port 1 View  Result Date: 12/14/2016 CLINICAL DATA:  Acute respiratory distress EXAM: PORTABLE CHEST 1 VIEW COMPARISON:  12/14/2016, 12/13/2016, 12/12/2016 FINDINGS: Tracheostomy tube is in place. Continued patchy infiltrate at the left lung base. Suspect tiny left effusion. Minimal atelectasis or infiltrate at the right base unchanged. Stable cardiomediastinal silhouette. No definitive pneumothorax is seen IMPRESSION: 1. Continued patchy infiltrates at the left greater than right lung base with suspected tiny left effusion. Electronically Signed   By: Donavan Foil M.D.   On: 12/14/2016 23:33   Dg Chest Port 1 View  Result Date: 12/14/2016 CLINICAL DATA:  Chronic ventilator dependent respiratory failure. Follow-up pneumothorax and follow-up bibasilar atelectasis versus pneumonia. EXAM: PORTABLE CHEST 1  VIEW COMPARISON:  12/13/2016, 12/12/2016 and earlier. FINDINGS: Tracheostomy tube tip in satisfactory position below the thoracic inlet. Cardiac silhouette normal in size for AP portable technique. No visible residual left apical pneumothorax. Persistent airspace opacities in the left lung base. Improved aeration in the right lung base with only mild atelectasis persisting. No new pulmonary parenchymal abnormalities. Normal pulmonary vascularity. Dystrophic calcification in the right coracoclavicular ligament as noted previously. IMPRESSION: 1. Resolution of the previously identified left apical pneumothorax. 2. Stable pneumonia involving the left lung base. 3. Improved aeration in the right lung base with only mild atelectasis persisting. 4. No new abnormalities.  Electronically Signed   By: Evangeline Dakin M.D.   On: 12/14/2016 13:37   Dg Chest Port 1 View  Result Date: 12/13/2016 CLINICAL DATA:  Tracheostomy patient. Follow-up left apical pneumothorax. EXAM: PORTABLE CHEST 1 VIEW COMPARISON:  Portable chest x-ray of December 12, 2016 FINDINGS: There is a 5% or less apical pneumothorax on the left which appears stable. 3 surgical clips project over the left apex and also are unchanged in position. There is no pleural effusion. There is patchy density in the retrocardiac region on the left and in the right infrahilar region. No right-sided pneumothorax or pleural effusion is observed. The heart and pulmonary vascularity are normal. The tracheostomy tube tip projects at the inferior margin of the clavicular heads. IMPRESSION: Stable 5% or less left apical pneumothorax. Persistent bibasilar atelectasis or pneumonia. Electronically Signed   By: David  Martinique M.D.   On: 12/13/2016 07:33   Dg Chest Port 1 View  Result Date: 12/12/2016 CLINICAL DATA:  Check tracheostomy placement EXAM: PORTABLE CHEST 1 VIEW COMPARISON:  12/11/2016 FINDINGS: Tracheostomy tube is again noted in satisfactory position. Cardiac shadow is stable. Left pneumothorax is again identified and stable. Patchy right basilar infiltrate is noted. No acute bony abnormality is seen. Chronic changes in the shoulder joints are noted. IMPRESSION: Stable left apical pneumothorax. Mild patchy changes in the right base. Electronically Signed   By: Inez Catalina M.D.   On: 12/12/2016 07:36   Dg Chest Port 1 View  Result Date: 12/11/2016 CLINICAL DATA:  Pneumothorax. EXAM: PORTABLE CHEST 1 VIEW COMPARISON:  Chest x-ray 12/10/2016.  Chest x-ray 09/03/2016 FINDINGS: Tracheostomy tube noted in stable position. Cardiomegaly with pulmonary vascular prominence and bilateral interstitial prominence consistent with CHF. Findings have worsened slightly from prior exam . Pneumonitis cannot be excluded. No prominent pleural  effusion. Small left pneumothorax is again noted without significant change. IMPRESSION: 1. Tracheostomy tube in stable position. 2.  Small left-sided pneumothorax noted without significant change. 3. Cardiomegaly with pulmonary vascular prominence and interstitial prominence suggesting CHF. Pneumonitis cannot be excluded. These findings have worsened slightly from prior exam. Electronically Signed   By: Baxley   On: 12/11/2016 13:05   Portable Chest 1 View  Result Date: 12/10/2016 CLINICAL DATA:  Sepsis. EXAM: PORTABLE CHEST 1 VIEW COMPARISON:  12/09/2016 FINDINGS: 0631 hours. There is a new small left-sided pneumothorax. Persistent bibasilar airspace disease, similar to prior. The cardiopericardial silhouette is within normal limits for size. Tracheostomy tube again noted. Telemetry leads overlie the chest. IMPRESSION: New small left pneumothorax. Critical Value/emergent results were called by telephone at the time of interpretation on 12/10/2016 at 8:22 am to Dr. Bonner Puna, who verbally acknowledged these results. Electronically Signed   By: Misty Stanley M.D.   On: 12/10/2016 08:24   Dg Chest Port 1 View  Result Date: 12/09/2016 CLINICAL DATA:  Pneumonia. EXAM: PORTABLE CHEST 1 VIEW COMPARISON:  Radiograph 11/20/2016 FINDINGS: Tracheostomy tube at the thoracic inlet. Worsening patchy right lung base opacity. Unchanged left basilar opacity. Upper lungs are clear, the patient's chin obscures the right lung apex. Unchanged heart size and mediastinal contours. No large pleural effusion. No pneumothorax. Tubular catheter like density projecting over the left upper chest is presumably external to the patient. IMPRESSION: Increasing right lung base opacity. This may be pneumonia, aspiration or atelectasis. Left basilar opacity is unchanged. Electronically Signed   By: Jeb Levering M.D.   On: 12/09/2016 04:24   Dg Swallowing Func-speech Pathology  Result Date: 11/27/2016 Objective Swallowing  Evaluation: Type of Study: MBS-Modified Barium Swallow Study Patient Details Name: SLATON REASER MRN: [id number] Date of Birth: 01-18-1951 Today's Date: 11/27/2016 Time: SLP Start Time (ACUTE ONLY): 1110-SLP Stop Time (ACUTE ONLY): 1140 SLP Time Calculation (min) (ACUTE ONLY): 30 min Past Medical History: Past Medical History: Diagnosis Date . Hypertension  . Stroke [facility])  Past Surgical History: Past Surgical History: Procedure Laterality Date . IR GASTROSTOMY TUBE MOD SED  10/27/2016 . KNEE SURGERY   HPI:  66 y.o. male with history of hypertension was brought to [facility] on 10/15/2016 after patient was found to be confused. As per the family who provided most of the history patient was not able to be raised and family went to check on him at home and was found on the floor confused. Patient was taken to the ER. The last contact was 2 days ago by patient's brother prior to the episode. At [facility] MRI showed multiple acute infarcts involving the both hemisphere of the cerebellum and right occipital lobe right midbrain and left thalamus the fourth ventricle was patent no hydrocephalus. 2-D echo showing EF of 55-60% normal LV size. Carotid Doppler was done which showed right ICA 40-59% stenosis with left 60-79% stenosis. Chest x-ray was showing infiltrates and patient also was febrile for which patient was started on antibiotics. Patient's creatinine at admission was 2.6 which improved to 1.5 the time of discharge. Patient also had rhabdomyolysis with CK of 7000 at the time of admission. As per the note patient was initially agitated requiring Haldol at the other hospital. Patient's mentation slowly improved gradually and family requested transfer to [facility] after discussing with neurologist at [facility] Dr. Leonel Ramsay Subjective: pt drowsy, mostly keeps eyes closed Assessment / Plan / Recommendation CHL IP CLINICAL IMPRESSIONS 11/27/2016 Clinical  Impression Pt currently with a moderate-severe oropharyngeal/ upper esophageal dysphagia, characterized by reduced posterior propulsion of bolus, tongue pumping, and premature spill to the level of the pyriform sinuses; decreased base of tongue retraction/ pharyngeal squeeze/ hyolaryngeal excursion resulted in penetration of thin liquids and puree consistencies which appeared to remain above the vocal folds. Pt had significant post-swallow residuals in the vallecula, pyriform sinuses, and at the UES along with moderate residuals that remained in the oral cavity. It appeared that the pt's spinal curvature/ structure was also impacting UES opening and resulted in some backflow to the pharynx. Pt unable to follow verbal/ visual cues for a 2nd swallow. Larger boluses resulted in a more efficient swallow/ better clearance of residuals, although residuals were most significant with puree. Given these findings, pt remains at a severe risk of aspiration; recommend that pt remain NPO. Pt would benefit from continued dysphagia therapy; would recommend providing nectar-thick liquid trials with larger hand-over-hand cup sips to increase efficiency of swallow.  SLP Visit Diagnosis Dysphagia, oropharyngeal phase (R13.12) Attention and concentration deficit  following -- Frontal lobe and executive function deficit following -- Impact on safety and function Severe aspiration risk;Risk for inadequate nutrition/hydration   CHL IP TREATMENT RECOMMENDATION 11/27/2016 Treatment Recommendations Therapy as outlined in treatment plan below   Prognosis 11/27/2016 Prognosis for Safe Diet Advancement Good Barriers to Reach Goals Cognitive deficits;Time post onset Barriers/Prognosis Comment -- CHL IP DIET RECOMMENDATION 11/27/2016 SLP Diet Recommendations NPO Liquid Administration via -- Medication Administration Via alternative means Compensations -- Postural Changes --   CHL IP OTHER RECOMMENDATIONS 11/27/2016 Recommended Consults -- Oral Care  Recommendations Oral care QID Other Recommendations --   CHL IP FOLLOW UP RECOMMENDATIONS 11/27/2016 Follow up Recommendations Skilled Nursing facility   [facility] IP FREQUENCY AND DURATION 11/27/2016 Speech Therapy Frequency (ACUTE ONLY) min 3x week Treatment Duration 2 weeks      CHL IP ORAL PHASE 11/27/2016 Oral Phase Impaired Oral - Pudding Teaspoon -- Oral - Pudding Cup -- Oral - Honey Teaspoon -- Oral - Honey Cup -- Oral - Nectar Teaspoon -- Oral - Nectar Cup -- Oral - Nectar Straw -- Oral - Thin Teaspoon Left anterior bolus loss;Right anterior bolus loss;Weak lingual manipulation;Reduced posterior propulsion;Lingual/palatal residue;Delayed oral transit;Premature spillage Oral - Thin Cup Left anterior bolus loss;Right anterior bolus loss;Weak lingual manipulation;Reduced posterior propulsion;Lingual/palatal residue;Delayed oral transit;Premature spillage Oral - Thin Straw -- Oral - Puree Weak lingual manipulation;Lingual pumping;Reduced posterior propulsion;Lingual/palatal residue;Delayed oral transit;Decreased bolus cohesion;Premature spillage Oral - Mech Soft -- Oral - Regular -- Oral - Multi-Consistency -- Oral - Pill -- Oral Phase - Comment --  CHL IP PHARYNGEAL PHASE 11/27/2016 Pharyngeal Phase Impaired Pharyngeal- Pudding Teaspoon -- Pharyngeal -- Pharyngeal- Pudding Cup -- Pharyngeal -- Pharyngeal- Honey Teaspoon -- Pharyngeal -- Pharyngeal- Honey Cup -- Pharyngeal -- Pharyngeal- Nectar Teaspoon -- Pharyngeal -- Pharyngeal- Nectar Cup -- Pharyngeal -- Pharyngeal- Nectar Straw -- Pharyngeal -- Pharyngeal- Thin Teaspoon Delayed swallow initiation-pyriform sinuses;Reduced pharyngeal peristalsis;Reduced epiglottic inversion;Reduced anterior laryngeal mobility;Reduced laryngeal elevation;Reduced tongue base retraction;Penetration/Aspiration during swallow;Pharyngeal residue - valleculae;Pharyngeal residue - pyriform;Pharyngeal residue - cp segment Pharyngeal Material enters airway, remains ABOVE vocal cords and not  ejected out Pharyngeal- Thin Cup Delayed swallow initiation-pyriform sinuses;Reduced pharyngeal peristalsis;Reduced epiglottic inversion;Reduced anterior laryngeal mobility;Reduced laryngeal elevation;Reduced tongue base retraction;Penetration/Aspiration during swallow;Pharyngeal residue - valleculae;Pharyngeal residue - pyriform;Pharyngeal residue - cp segment Pharyngeal Material enters airway, remains ABOVE vocal cords and not ejected out Pharyngeal- Thin Straw -- Pharyngeal -- Pharyngeal- Puree Delayed swallow initiation-vallecula;Reduced pharyngeal peristalsis;Reduced epiglottic inversion;Reduced anterior laryngeal mobility;Reduced laryngeal elevation;Reduced tongue base retraction;Penetration/Aspiration during swallow;Pharyngeal residue - valleculae;Pharyngeal residue - pyriform;Pharyngeal residue - posterior pharnyx;Pharyngeal residue - cp segment Pharyngeal Material enters airway, remains ABOVE vocal cords and not ejected out Pharyngeal- Mechanical Soft -- Pharyngeal -- Pharyngeal- Regular -- Pharyngeal -- Pharyngeal- Multi-consistency -- Pharyngeal -- Pharyngeal- Pill -- Pharyngeal -- Pharyngeal Comment --  CHL IP CERVICAL ESOPHAGEAL PHASE 11/27/2016 Cervical Esophageal Phase Impaired Pudding Teaspoon -- Pudding Cup -- Honey Teaspoon -- Honey Cup -- Nectar Teaspoon -- Nectar Cup -- Nectar Straw -- Thin Teaspoon WFL Thin Cup Esophageal backflow into the pharynx Thin Straw -- Puree Esophageal backflow into the pharynx Mechanical Soft -- Regular -- Multi-consistency -- Pill -- Cervical Esophageal Comment -- CHL IP GO 10/24/2016 Functional Assessment Tool Used NOMS; clinical judgment Functional Limitations Swallowing Swallow Current Status (J2878) CK Swallow Goal Status (M7672) CJ Swallow Discharge Status (C9470) (None) Motor Speech Current Status (J6283) (None) Motor Speech Goal Status (M6294) (None) Motor Speech Goal Status (T6546) (None) Spoken Language Comprehension Current Status (T0354) (None) Spoken  Language Comprehension Goal Status (  G9160) (None) Spoken Language Comprehension Discharge Status [phone]) (None) Spoken Language Expression Current Status [phone]) (None) Spoken Language Expression Goal Status [phone]) (None) Spoken Language Expression Discharge Status [phone]) (None) Attention Current Status (H6314) (None) Attention Goal Status (H7026) (None) Attention Discharge Status [phone]) (None) Memory Current Status (I5027) (None) Memory Goal Status (X4128) (None) Memory Discharge Status (N8676) (None) Voice Current Status (H2094) (None) Voice Goal Status (B0962) (None) Voice Discharge Status [phone]) (None) Other Speech-Language Pathology Functional Limitation Current Status [phone]) (None) Other Speech-Language Pathology Functional Limitation Goal Status [phone]) (None) Other Speech-Language Pathology Functional Limitation Discharge Status [phone]) (None) Amy Dionicia Abler, MA, CCC-SLP 11/27/2016, 12:15 PM [phone]               Microbiology: Recent Results (from the past 240 hour(s))  Culture, blood (Routine X 2) w Reflex to ID Panel     Status: None   Collection Time: 12/16/16 12:32 PM  Result Value Ref Range Status   Specimen Description BLOOD RIGHT HAND  Final   Special Requests IN PEDIATRIC BOTTLE Blood Culture adequate volume  Final   Culture NO GROWTH 5 DAYS  Final   Report Status 12/21/2016 FINAL  Final  Culture, blood (Routine X 2) w Reflex to ID Panel     Status: None   Collection Time: 12/16/16 12:33 PM  Result Value Ref Range Status   Specimen Description BLOOD RIGHT ANTECUBITAL  Final   Special Requests IN PEDIATRIC BOTTLE Blood Culture adequate volume  Final   Culture NO GROWTH 5 DAYS  Final   Report Status 12/21/2016 FINAL  Final     Labs: Basic Metabolic Panel:  Recent Labs Lab 12/15/16 0345 12/16/16 0535 12/20/16 0358 12/21/16 0633  NA 142 141 138 135  K 4.1 4.0 4.5 5.1  CL 106 105 101 99*  CO2 25 27 28 25   GLUCOSE 116* 138* 107* 116*  BUN 24* 25* 30* 29*   CREATININE 0.98 0.96 1.06 0.98  CALCIUM 10.5* 10.2  10.2 10.5* 10.5*   Liver Function Tests: No results for input(s): AST, ALT, ALKPHOS, BILITOT, PROT, ALBUMIN in the last 168 hours. No results for input(s): LIPASE, AMYLASE in the last 168 hours. No results for input(s): AMMONIA in the last 168 hours. CBC:  Recent Labs Lab 12/15/16 0345 12/16/16 1232 12/18/16 1130 12/20/16 0358 12/20/16 1919 12/21/16 0633  WBC 17.3* 18.6* 17.4* 16.8*  --  17.2*  HGB 8.4* 8.2* 7.5* 7.3* 8.4* 8.7*  HCT 27.3* 26.3* 24.1* 24.0* 26.4* 27.9*  MCV 93.8 91.0 92.0 92.7  --  90.6  PLT 505* 483* 572* 640*  --  692*   Cardiac Enzymes: No results for input(s): CKTOTAL, CKMB, CKMBINDEX, TROPONINI in the last 168 hours. BNP: BNP (last 3 results)  Recent Labs  12/09/16 0351  BNP 18.0    ProBNP (last 3 results) No results for input(s): PROBNP in the last 8760 hours.  CBG:  Recent Labs Lab 12/20/16 1635 12/20/16 1947 12/20/16 2340 12/21/16 0409 12/21/16 0749  GLUCAP 103* 122* 113* 121* 102*       Signed:  Jeraldin Fesler  [facility] 12/21/2016, 9:17 AM

## 2016-12-22 ENCOUNTER — Encounter (HOSPITAL_COMMUNITY): Payer: Self-pay | Admitting: Emergency Medicine

## 2016-12-22 ENCOUNTER — Inpatient Hospital Stay (HOSPITAL_COMMUNITY)
Admission: EM | Admit: 2016-12-22 | Discharge: 2017-01-10 | DRG: 207 | Disposition: A | Discharge status: Home / Self Care | Payer: Medicare HMO | Attending: Internal Medicine | Admitting: Internal Medicine

## 2016-12-22 ENCOUNTER — Emergency Department (HOSPITAL_COMMUNITY): Payer: Medicare HMO

## 2016-12-22 DIAGNOSIS — R1312 Dysphagia, oropharyngeal phase: Secondary | ICD-10-CM | POA: Diagnosis not present

## 2016-12-22 DIAGNOSIS — Z515 Encounter for palliative care: Secondary | ICD-10-CM | POA: Diagnosis not present

## 2016-12-22 DIAGNOSIS — I639 Cerebral infarction, unspecified: Secondary | ICD-10-CM | POA: Diagnosis present

## 2016-12-22 DIAGNOSIS — I69391 Dysphagia following cerebral infarction: Secondary | ICD-10-CM

## 2016-12-22 DIAGNOSIS — R1313 Dysphagia, pharyngeal phase: Secondary | ICD-10-CM | POA: Diagnosis not present

## 2016-12-22 DIAGNOSIS — I10 Essential (primary) hypertension: Secondary | ICD-10-CM | POA: Diagnosis present

## 2016-12-22 DIAGNOSIS — E43 Unspecified severe protein-calorie malnutrition: Secondary | ICD-10-CM | POA: Diagnosis present

## 2016-12-22 DIAGNOSIS — J90 Pleural effusion, not elsewhere classified: Secondary | ICD-10-CM | POA: Diagnosis present

## 2016-12-22 DIAGNOSIS — K9429 Other complications of gastrostomy: Secondary | ICD-10-CM | POA: Diagnosis present

## 2016-12-22 DIAGNOSIS — R1314 Dysphagia, pharyngoesophageal phase: Secondary | ICD-10-CM | POA: Diagnosis not present

## 2016-12-22 DIAGNOSIS — J690 Pneumonitis due to inhalation of food and vomit: Principal | ICD-10-CM | POA: Diagnosis present

## 2016-12-22 DIAGNOSIS — J189 Pneumonia, unspecified organism: Secondary | ICD-10-CM | POA: Diagnosis present

## 2016-12-22 DIAGNOSIS — I63443 Cerebral infarction due to embolism of bilateral cerebellar arteries: Secondary | ICD-10-CM | POA: Diagnosis not present

## 2016-12-22 DIAGNOSIS — Z87891 Personal history of nicotine dependence: Secondary | ICD-10-CM | POA: Diagnosis not present

## 2016-12-22 DIAGNOSIS — Z6820 Body mass index (BMI) 20.0-20.9, adult: Secondary | ICD-10-CM | POA: Diagnosis not present

## 2016-12-22 DIAGNOSIS — T17890A Other foreign object in other parts of respiratory tract causing asphyxiation, initial encounter: Secondary | ICD-10-CM | POA: Diagnosis present

## 2016-12-22 DIAGNOSIS — I69320 Aphasia following cerebral infarction: Secondary | ICD-10-CM | POA: Diagnosis not present

## 2016-12-22 DIAGNOSIS — L8990 Pressure ulcer of unspecified site, unspecified stage: Secondary | ICD-10-CM | POA: Diagnosis present

## 2016-12-22 DIAGNOSIS — D638 Anemia in other chronic diseases classified elsewhere: Secondary | ICD-10-CM | POA: Diagnosis not present

## 2016-12-22 DIAGNOSIS — Z79899 Other long term (current) drug therapy: Secondary | ICD-10-CM | POA: Diagnosis not present

## 2016-12-22 DIAGNOSIS — D631 Anemia in chronic kidney disease: Secondary | ICD-10-CM | POA: Diagnosis present

## 2016-12-22 DIAGNOSIS — N179 Acute kidney failure, unspecified: Secondary | ICD-10-CM | POA: Diagnosis present

## 2016-12-22 DIAGNOSIS — I63549 Cerebral infarction due to unspecified occlusion or stenosis of unspecified cerebellar artery: Secondary | ICD-10-CM | POA: Diagnosis not present

## 2016-12-22 DIAGNOSIS — L89152 Pressure ulcer of sacral region, stage 2: Secondary | ICD-10-CM | POA: Diagnosis not present

## 2016-12-22 DIAGNOSIS — K9420 Gastrostomy complication, unspecified: Secondary | ICD-10-CM | POA: Diagnosis not present

## 2016-12-22 DIAGNOSIS — R1310 Dysphagia, unspecified: Secondary | ICD-10-CM | POA: Diagnosis present

## 2016-12-22 DIAGNOSIS — Z7189 Other specified counseling: Secondary | ICD-10-CM | POA: Diagnosis not present

## 2016-12-22 DIAGNOSIS — J181 Lobar pneumonia, unspecified organism: Secondary | ICD-10-CM | POA: Diagnosis not present

## 2016-12-22 DIAGNOSIS — Y848 Other medical procedures as the cause of abnormal reaction of the patient, or of later complication, without mention of misadventure at the time of the procedure: Secondary | ICD-10-CM | POA: Diagnosis present

## 2016-12-22 DIAGNOSIS — Z7982 Long term (current) use of aspirin: Secondary | ICD-10-CM

## 2016-12-22 DIAGNOSIS — Z7902 Long term (current) use of antithrombotics/antiplatelets: Secondary | ICD-10-CM | POA: Diagnosis not present

## 2016-12-22 DIAGNOSIS — N189 Chronic kidney disease, unspecified: Secondary | ICD-10-CM | POA: Diagnosis not present

## 2016-12-22 DIAGNOSIS — N183 Chronic kidney disease, stage 3 (moderate): Secondary | ICD-10-CM | POA: Diagnosis present

## 2016-12-22 DIAGNOSIS — J9601 Acute respiratory failure with hypoxia: Secondary | ICD-10-CM | POA: Diagnosis not present

## 2016-12-22 DIAGNOSIS — D72828 Other elevated white blood cell count: Secondary | ICD-10-CM | POA: Diagnosis not present

## 2016-12-22 DIAGNOSIS — Z8673 Personal history of transient ischemic attack (TIA), and cerebral infarction without residual deficits: Secondary | ICD-10-CM | POA: Diagnosis not present

## 2016-12-22 DIAGNOSIS — J9621 Acute and chronic respiratory failure with hypoxia: Secondary | ICD-10-CM | POA: Diagnosis present

## 2016-12-22 DIAGNOSIS — E440 Moderate protein-calorie malnutrition: Secondary | ICD-10-CM | POA: Diagnosis not present

## 2016-12-22 DIAGNOSIS — Z930 Tracheostomy status: Secondary | ICD-10-CM | POA: Diagnosis not present

## 2016-12-22 DIAGNOSIS — J9600 Acute respiratory failure, unspecified whether with hypoxia or hypercapnia: Secondary | ICD-10-CM | POA: Diagnosis not present

## 2016-12-22 DIAGNOSIS — L89154 Pressure ulcer of sacral region, stage 4: Secondary | ICD-10-CM | POA: Diagnosis not present

## 2016-12-22 DIAGNOSIS — J9690 Respiratory failure, unspecified, unspecified whether with hypoxia or hypercapnia: Secondary | ICD-10-CM | POA: Diagnosis not present

## 2016-12-22 DIAGNOSIS — L89151 Pressure ulcer of sacral region, stage 1: Secondary | ICD-10-CM | POA: Diagnosis present

## 2016-12-22 DIAGNOSIS — J9620 Acute and chronic respiratory failure, unspecified whether with hypoxia or hypercapnia: Secondary | ICD-10-CM | POA: Diagnosis not present

## 2016-12-22 DIAGNOSIS — D72829 Elevated white blood cell count, unspecified: Secondary | ICD-10-CM | POA: Diagnosis not present

## 2016-12-22 LAB — COMPREHENSIVE METABOLIC PANEL
ALT: 39 U/L (ref 17–63)
AST: 25 U/L (ref 15–41)
Albumin: 2 g/dL — ABNORMAL LOW (ref 3.5–5.0)
Alkaline Phosphatase: 171 U/L — ABNORMAL HIGH (ref 38–126)
Anion gap: 11 (ref 5–15)
BUN: 33 mg/dL — ABNORMAL HIGH (ref 6–20)
CO2: 26 mmol/L (ref 22–32)
Calcium: 11.1 mg/dL — ABNORMAL HIGH (ref 8.9–10.3)
Chloride: 100 mmol/L — ABNORMAL LOW (ref 101–111)
Creatinine, Ser: 1.1 mg/dL (ref 0.61–1.24)
GFR calc Af Amer: >60 mL/min (ref >60)
GFR calc non Af Amer: >60 mL/min (ref >60)
Glucose, Bld: 128 mg/dL — ABNORMAL HIGH (ref 65–99)
Potassium: 4.4 mmol/L (ref 3.5–5.1)
Sodium: 137 mmol/L (ref 135–145)
Total Bilirubin: 0.7 mg/dL (ref 0.3–1.2)
Total Protein: 8.6 g/dL — ABNORMAL HIGH (ref 6.5–8.1)

## 2016-12-22 LAB — CBC WITH DIFFERENTIAL/PLATELET
Basophils Absolute: 0 10*3/uL (ref 0.0–0.1)
Basophils Relative: 0 %
Eosinophils Absolute: 0.2 10*3/uL (ref 0.0–0.7)
Eosinophils Relative: 1 %
HCT: 31.5 % — ABNORMAL LOW (ref 39.0–52.0)
Hemoglobin: 10 g/dL — ABNORMAL LOW (ref 13.0–17.0)
Lymphocytes Relative: 9 %
Lymphs Abs: 2.1 10*3/uL (ref 0.7–4.0)
MCH: 28.8 pg (ref 26.0–34.0)
MCHC: 31.7 g/dL (ref 30.0–36.0)
MCV: 90.8 fL (ref 78.0–100.0)
Monocytes Absolute: 2.3 10*3/uL — ABNORMAL HIGH (ref 0.1–1.0)
Monocytes Relative: 9 %
Neutro Abs: 20.2 10*3/uL — ABNORMAL HIGH (ref 1.7–7.7)
Neutrophils Relative %: 81 %
Platelets: 841 10*3/uL — ABNORMAL HIGH (ref 150–400)
RBC: 3.47 MIL/uL — ABNORMAL LOW (ref 4.22–5.81)
RDW: 16.6 % — ABNORMAL HIGH (ref 11.5–15.5)
WBC: 24.9 10*3/uL — ABNORMAL HIGH (ref 4.0–10.5)

## 2016-12-22 LAB — I-STAT CG4 LACTIC ACID, ED: Lactic Acid, Venous: 1.31 mmol/L (ref 0.5–1.9)

## 2016-12-22 MED ORDER — ASPIRIN 81 MG PO CHEW: 324.0000 mg | CHEWABLE_TABLET | ORAL | Status: DC

## 2016-12-22 MED ORDER — HEPARIN SODIUM (PORCINE) 5000 UNIT/ML IJ SOLN
5000.0000 [IU] | Freq: Three times a day (TID) | INTRAMUSCULAR | Status: DC
  Administered 2016-12-22 – 2016-12-30 (×22): 5000 [IU] via SUBCUTANEOUS
  Filled 2016-12-22 (×23): qty 1

## 2016-12-22 MED ORDER — VITAMIN B-1 100 MG PO TABS
100.0000 mg | ORAL_TABLET | Freq: Every day | ORAL | Status: DC
  Administered 2016-12-23 – 2017-01-10 (×18): 100 mg
  Filled 2016-12-22 (×20): qty 1

## 2016-12-22 MED ORDER — ADULT MULTIVITAMIN W/MINERALS CH
1.0000 | ORAL_TABLET | Freq: Every day | ORAL | Status: DC
  Administered 2016-12-23 – 2017-01-10 (×18): 1
  Filled 2016-12-22 (×20): qty 1

## 2016-12-22 MED ORDER — FENTANYL CITRATE (PF) 100 MCG/2ML IJ SOLN: 50.0000 ug | Freq: Once | INTRAMUSCULAR | Status: DC

## 2016-12-22 MED ORDER — FENTANYL CITRATE (PF) 100 MCG/2ML IJ SOLN: 50.0000 ug | INTRAMUSCULAR | Status: DC | PRN

## 2016-12-22 MED ORDER — FENTANYL 2500MCG IN NS 250ML (10MCG/ML) PREMIX INFUSION
25.0000 ug/h | INTRAVENOUS | Status: DC
  Administered 2016-12-23: 25 ug/h via INTRAVENOUS
  Filled 2016-12-22: qty 250

## 2016-12-22 MED ORDER — FAMOTIDINE 40 MG/5ML PO SUSR: 20.0000 mg | Freq: Two times a day (BID) | ORAL | Status: DC

## 2016-12-22 MED ORDER — ASPIRIN 300 MG RE SUPP: 300.0000 mg | RECTAL | Status: DC

## 2016-12-22 MED ORDER — VANCOMYCIN HCL IN DEXTROSE 1-5 GM/200ML-% IV SOLN
1000.0000 mg | Freq: Once | INTRAVENOUS | Status: AC
  Administered 2016-12-22: 1000 mg via INTRAVENOUS
  Filled 2016-12-22: qty 200

## 2016-12-22 MED ORDER — ACETAMINOPHEN 325 MG PO TABS: 650.0000 mg | ORAL_TABLET | Freq: Four times a day (QID) | ORAL | Status: DC | PRN

## 2016-12-22 MED ORDER — ONDANSETRON HCL 4 MG/2ML IJ SOLN: 4.0000 mg | Freq: Four times a day (QID) | INTRAMUSCULAR | Status: DC | PRN

## 2016-12-22 MED ORDER — PIPERACILLIN-TAZOBACTAM 3.375 G IVPB 30 MIN
3.3750 g | Freq: Once | INTRAVENOUS | Status: AC
  Administered 2016-12-22: 3.375 g via INTRAVENOUS
  Filled 2016-12-22: qty 50

## 2016-12-22 MED ORDER — CLONIDINE HCL 0.1 MG PO TABS
0.1000 mg | ORAL_TABLET | Freq: Two times a day (BID) | ORAL | Status: DC
  Administered 2016-12-22 – 2016-12-26 (×9): 0.1 mg
  Filled 2016-12-22 (×12): qty 1

## 2016-12-22 MED ORDER — CLOPIDOGREL BISULFATE 75 MG PO TABS
75.0000 mg | ORAL_TABLET | Freq: Every day | ORAL | Status: DC
  Administered 2016-12-23 – 2017-01-10 (×18): 75 mg
  Filled 2016-12-22 (×19): qty 1

## 2016-12-22 MED ORDER — PRO-STAT SUGAR FREE PO LIQD
30.0000 mL | Freq: Three times a day (TID) | ORAL | Status: DC
  Administered 2016-12-23: 30 mL
  Filled 2016-12-22 (×3): qty 30

## 2016-12-22 MED ORDER — GABAPENTIN 100 MG PO CAPS
200.0000 mg | ORAL_CAPSULE | Freq: Every day | ORAL | Status: DC
  Administered 2016-12-23 – 2016-12-26 (×5): 200 mg via ORAL
  Filled 2016-12-22 (×5): qty 2

## 2016-12-22 MED ORDER — SODIUM CHLORIDE 0.9 % IV BOLUS (SEPSIS)
2000.0000 mL | Freq: Once | INTRAVENOUS | Status: AC
  Administered 2016-12-22: 2000 mL via INTRAVENOUS

## 2016-12-22 MED ORDER — FENTANYL CITRATE (PF) 100 MCG/2ML IJ SOLN
50.0000 ug | INTRAMUSCULAR | Status: AC | PRN
  Administered 2016-12-22 (×3): 50 ug via INTRAVENOUS
  Filled 2016-12-22 (×4): qty 2

## 2016-12-22 MED ORDER — VANCOMYCIN HCL IN DEXTROSE 750-5 MG/150ML-% IV SOLN
750.0000 mg | Freq: Two times a day (BID) | INTRAVENOUS | Status: DC
  Administered 2016-12-23 – 2016-12-25 (×6): 750 mg via INTRAVENOUS
  Filled 2016-12-22 (×9): qty 150

## 2016-12-22 MED ORDER — ASPIRIN 325 MG PO TABS
325.0000 mg | ORAL_TABLET | Freq: Every day | ORAL | Status: DC
  Administered 2016-12-23 – 2017-01-10 (×18): 325 mg
  Filled 2016-12-22 (×19): qty 1

## 2016-12-22 MED ORDER — PRAVASTATIN SODIUM 20 MG PO TABS
20.0000 mg | ORAL_TABLET | Freq: Every day | ORAL | Status: DC
  Administered 2016-12-23 – 2017-01-09 (×18): 20 mg
  Filled 2016-12-22 (×20): qty 1

## 2016-12-22 MED ORDER — SODIUM CHLORIDE 0.9 % IV SOLN: 250.0000 mL | INTRAVENOUS | Status: DC | PRN

## 2016-12-22 MED ORDER — SODIUM CHLORIDE 0.9 % IV SOLN
INTRAVENOUS | Status: DC
  Administered 2016-12-22 – 2016-12-24 (×2): via INTRAVENOUS
  Administered 2016-12-25: 50 mL/h via INTRAVENOUS

## 2016-12-22 MED ORDER — IPRATROPIUM-ALBUTEROL 0.5-2.5 (3) MG/3ML IN SOLN
3.0000 mL | Freq: Four times a day (QID) | RESPIRATORY_TRACT | Status: DC
  Administered 2016-12-22 – 2017-01-04 (×52): 3 mL via RESPIRATORY_TRACT
  Filled 2016-12-22 (×50): qty 3

## 2016-12-22 MED ORDER — SODIUM CHLORIDE 3 % IN NEBU
4.0000 mL | INHALATION_SOLUTION | Freq: Three times a day (TID) | RESPIRATORY_TRACT | Status: AC
  Administered 2016-12-22 – 2016-12-24 (×9): 4 mL via RESPIRATORY_TRACT
  Filled 2016-12-22 (×10): qty 4

## 2016-12-22 MED ORDER — METOPROLOL TARTRATE 25 MG/10 ML ORAL SUSPENSION
10.0000 mg | Freq: Two times a day (BID) | ORAL | Status: DC
  Administered 2016-12-23 – 2017-01-08 (×33): 10 mg
  Filled 2016-12-22: qty 10
  Filled 2016-12-22 (×2): qty 5
  Filled 2016-12-22 (×4): qty 10
  Filled 2016-12-22: qty 5
  Filled 2016-12-22 (×3): qty 10
  Filled 2016-12-22: qty 5
  Filled 2016-12-22 (×2): qty 10
  Filled 2016-12-22: qty 5
  Filled 2016-12-22 (×4): qty 10
  Filled 2016-12-22: qty 5
  Filled 2016-12-22 (×3): qty 10
  Filled 2016-12-22 (×2): qty 5
  Filled 2016-12-22: qty 10
  Filled 2016-12-22: qty 5
  Filled 2016-12-22 (×2): qty 10
  Filled 2016-12-22: qty 5
  Filled 2016-12-22 (×3): qty 10
  Filled 2016-12-22 (×2): qty 5

## 2016-12-22 MED ORDER — AMLODIPINE BESYLATE 2.5 MG PO TABS
2.5000 mg | ORAL_TABLET | Freq: Every day | ORAL | Status: DC
  Administered 2016-12-23 – 2016-12-26 (×4): 2.5 mg
  Filled 2016-12-22 (×5): qty 1

## 2016-12-22 MED ORDER — FENTANYL BOLUS VIA INFUSION
25.0000 ug | INTRAVENOUS | Status: DC | PRN
  Filled 2016-12-22: qty 25

## 2016-12-22 MED ORDER — PIPERACILLIN-TAZOBACTAM 3.375 G IVPB
3.3750 g | Freq: Three times a day (TID) | INTRAVENOUS | Status: DC
  Administered 2016-12-23 – 2016-12-28 (×17): 3.375 g via INTRAVENOUS
  Filled 2016-12-22 (×22): qty 50

## 2016-12-22 MED ORDER — FAMOTIDINE IN NACL 20-0.9 MG/50ML-% IV SOLN
20.0000 mg | Freq: Two times a day (BID) | INTRAVENOUS | Status: DC
  Administered 2016-12-22: 20 mg via INTRAVENOUS
  Filled 2016-12-22 (×2): qty 50

## 2016-12-22 NOTE — Progress Notes (Signed)
Pharmacy Antibiotic Note  Scott Rivera is a 66 y.o. male admitted on 12/22/2016 with sepsis.  Pharmacy has been consulted for Vancomycin and Zosyn dosing.  Patient has already received vancomycin 1g IV and Zosyn 3.375g IV each x1 in the ED. With copious amount of secretions from trach. Recently treated for aspiration pneumonitis- discharged 9/6 to facility.   Plan: Vancomycin 750mg  IV q12h Zosyn 3.375g IV q8h Follow c/s, clinical progression, renal function, vancomycin trough at SS     No data recorded.   Recent Labs Lab 12/16/16 0535 12/16/16 1232 12/18/16 1130 12/20/16 0358 12/21/16 0633 12/22/16 1217 12/22/16 1227  WBC  --  18.6* 17.4* 16.8* 17.2* 24.9*  --   CREATININE 0.96  --   --  1.06 0.98 1.10  --   LATICACIDVEN  --   --   --   --   --   --  1.31    Estimated Creatinine Clearance: 62.5 mL/min (by C-G formula based on SCr of 1.1 mg/dL).    No Known Allergies  Antimicrobials this admission: Vancomycin 9/7 >>  Zosyn 9/7 >>   Dose adjustments this admission: n/a  Microbiology results: 9/7 BCx:  9/7 Sputum:     Thank you for allowing pharmacy to be a part of this patient's care.  Tyah Acord D. Dalila Arca, PharmD, BCPS Clinical Pharmacist Pager: [phone] Clinical Phone for 12/22/2016 until 3:30pm: [phone] If after 3:30pm, please call main pharmacy at [phone] 12/22/2016 2:21 PM

## 2016-12-22 NOTE — H&P (Signed)
PULMONARY / CRITICAL CARE MEDICINE   Name: Scott Rivera MRN: [id number] DOB: [birth year]    ADMISSION DATE:  12/22/2016 CONSULTATION DATE:  12/22/16  REFERRING MD:  Dr. Roderic Palau / EDP   CHIEF COMPLAINT:  Low O2 saturations   HISTORY OF PRESENT ILLNESS:   66 y/o M who presented with acute hypoxic respiratory failure.  Baseline nonverbal, trach / PEG and lives in a SNF since July 2018 after CVA.   Recent admit from 8/25-9/6 for sepsis in the setting of aspiration PNA, AKI. He was discharged 9/6 to a SNF for care.  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      The patient returns 9/7 with reports of low oxygen saturations (60's) at the SNF.  He was reportedly found with increased secretions, thick yellow sputum and tachycardia.  On arrival to the ER, the patient vomited on the staff.  Initial labs - Na 137, K 4.4, Cl 100, CO2 26, glucose 128, BUN 33, Sr Cr 1.10, alk phos 171, albumin 2.0, AST 25, ALT 39, T. Bilirubin 0.7, lactic acid 1.31, WBC 24.9, Hgb 10 and platelets 841.  CXR showed LLL mucus plugging with cut off sign and airspace disease.  Attempts at suctioning the patient were made without improvement in saturations.  Uncuffed trach was changed to a cuffed and the patient was placed on mechanical ventilation.  PCCM called for ICU admission.     PAST MEDICAL HISTORY :  He  has a past medical history of Anemia due to chronic kidney disease; Aspiration pneumonia ([place]); Chronic kidney disease, stage 3 (moderate); Hypertension; and Stroke [facility]  PAST SURGICAL HISTORY: He  has a past surgical history that includes Knee surgery; IR GASTROSTOMY TUBE MOD SED (10/27/2016); and Tracheostomy tube placement.  No Known Allergies  No current facility-administered medications on file prior to encounter.    Current Outpatient Prescriptions on File Prior to  Encounter  Medication Sig  . Amino Acids-Protein Hydrolys (FEEDING SUPPLEMENT, PRO-STAT SUGAR FREE 64,) LIQD Place 30 mLs into feeding tube 3 (three) times daily. (Patient taking differently: Place 30 mLs into feeding tube daily. )  . amLODipine (NORVASC) 10 MG tablet Place 0.5 tablets (5 mg total) into feeding tube daily.  Marland Kitchen aspirin 325 MG tablet Place 1 tablet (325 mg total) into feeding tube daily.  . bethanechol (URECHOLINE) 10 MG tablet Place 1 tablet (10 mg total) into feeding tube 3 (three) times daily.  . cloNIDine (CATAPRES) 0.1 MG tablet Place 1 tablet (0.1 mg total) into feeding tube 2 (two) times daily.  . clopidogrel (PLAVIX) 75 MG tablet Place 1 tablet (75 mg total) into feeding tube daily.  . famotidine (PEPCID) 40 MG/5ML suspension Place 2.5 mLs (20 mg total) into feeding tube 2 (two) times daily.  Marland Kitchen gabapentin (NEURONTIN) 100 MG capsule Take 200 mg by mouth at bedtime.  . metoCLOPramide (REGLAN) 5 MG/5ML solution Place 10 mLs (10 mg total) into feeding tube 4 (four) times daily -  before meals and at bedtime.  . metoprolol tartrate (LOPRESSOR) 25 mg/10 mL SUSP Place 10 mLs (25 mg total) into feeding tube 2 (two) times daily.  . Multiple Vitamin (MULTIVITAMIN WITH MINERALS) TABS tablet Place 1 tablet into feeding tube daily.  . nicotine (NICODERM CQ - DOSED IN MG/24 HOURS) 14 mg/24hr patch Place 1 patch (14 mg total) onto the skin daily.  Marland Kitchen  Nutritional Supplements (FEEDING SUPPLEMENT, JEVITY 1.5 CAL/FIBER,) LIQD Place 1,000 mLs into feeding tube continuous.  . pravastatin (PRAVACHOL) 20 MG tablet Place 1 tablet (20 mg total) into feeding tube daily at 6 PM.  . senna (SENOKOT) 8.6 MG TABS tablet Place 1 tablet (8.6 mg total) into feeding tube 2 (two) times daily.  Marland Kitchen thiamine 100 MG tablet Place 1 tablet (100 mg total) into feeding tube daily.  . Water For Irrigation, Sterile (FREE WATER) SOLN Place 300 mLs into feeding tube every 4 (four) hours. (Patient taking differently: Place 300  mLs into feeding tube 4 (four) times daily. )    FAMILY HISTORY:  His indicated that the status of his brother is unknown. He indicated that the status of his neg hx is unknown.    SOCIAL HISTORY: He  reports that he has quit smoking. He has never used smokeless tobacco. He reports that he drinks alcohol.  REVIEW OF SYSTEMS:  Unable to complete as patient is non-verbal.  No family at bedside.   SUBJECTIVE:     VITAL SIGNS: BP 128/86   Pulse (!) 136   Resp (!) 40   SpO2 94%   HEMODYNAMICS:    VENTILATOR SETTINGS: Vent Mode: PRVC FiO2 (%):  [100 %] 100 % Set Rate:  [14 bmp] 14 bmp Vt Set:  [500 mL] 500 mL PEEP:  [8 cmH20] 8 cmH20  INTAKE / OUTPUT: No intake/output data recorded.  PHYSICAL EXAMINATION: General: chronically ill appearing male on vent, tachypneic  HEENT: MM pink/moist, temporal wasting, #6 cuffed trach midline, scant bloody secretions  Neuro: eyes open, no response to verbal stimuli, spontaneously moves RUE/shoulder, contractures in extremities  CV: s1s2 rrr, no m/r/g PULM: even/non-labored, lungs bilaterally clear on R, bronchial breath sounds LLL WV:PXTG, non-tender, bsx4 active  Extremities: warm/dry, no edema, contracture braces in place, deformed / large knees, L>R Skin: no rashes or lesions  LABS:  BMET  Recent Labs Lab 12/20/16 0358 12/21/16 0633 12/22/16 1217  NA 138 135 137  K 4.5 5.1 4.4  CL 101 99* 100*  CO2 28 25 26   BUN 30* 29* 33*  CREATININE 1.06 0.98 1.10  GLUCOSE 107* 116* 128*    Electrolytes  Recent Labs Lab 12/20/16 0358 12/21/16 0633 12/22/16 1217  CALCIUM 10.5* 10.5* 11.1*    CBC  Recent Labs Lab 12/20/16 0358 12/20/16 1919 12/21/16 0633 12/22/16 1217  WBC 16.8*  --  17.2* 24.9*  HGB 7.3* 8.4* 8.7* 10.0*  HCT 24.0* 26.4* 27.9* 31.5*  PLT 640*  --  692* 841*    Coag's No results for input(s): APTT, INR in the last 168 hours.  Sepsis Markers  Recent Labs Lab 12/22/16 1227  LATICACIDVEN 1.31     ABG No results for input(s): PHART, PCO2ART, PO2ART in the last 168 hours.  Liver Enzymes  Recent Labs Lab 12/22/16 1217  AST 25  ALT 39  ALKPHOS 171*  BILITOT 0.7  ALBUMIN 2.0*    Cardiac Enzymes No results for input(s): TROPONINI, PROBNP in the last 168 hours.  Glucose  Recent Labs Lab 12/20/16 1635 12/20/16 1947 12/20/16 2340 12/21/16 0409 12/21/16 0749 12/21/16 1234  GLUCAP 103* 122* 113* 121* 102* 129*    Imaging Dg Chest Port 1 View  Result Date: 12/22/2016 CLINICAL DATA:  Short of breath EXAM: PORTABLE CHEST 1 VIEW COMPARISON:  12/16/2016 FINDINGS: Tracheostomy in good position. Progressive consolidation left lung base since the prior study. Probable left effusion as well as volume loss. Left lower lobe bronchus  cut off likely due to mucous plug or secretions. Improved aeration in the right lung base since the prior study. Right lung now clear IMPRESSION: Progressive collapse of the left lower lobe. Probable secretions or mucous plugging left lower lobe bronchus. Left effusion is present. Left lower lobe pneumonia not excluded. Electronically Signed   By: Franchot Gallo M.D.   On: 12/22/2016 13:04     STUDIES:  CXR 9/7 >> progressive left consolidation, left effusion and volume loss, LLL cut off / mucus plugging, R side clear from previous (images personally reviewed)  CULTURES: BCx2 9/7 >>  Tracheal Aspirate 9/7 >>  UA 9/7 >> Strep Antigen 9/7 >>   ANTIBIOTICS: Vanco 9/7 >>  Zosyn 9/7 >>   SIGNIFICANT EVENTS: 9/06  Discharge after admit with aspiration PNA, AKI 9/07  Re-admit with acute hypoxic respiratory failure   LINES/TUBES: Trach (cuffed changed) 9/7 >>  DISCUSSION: 67 y/o M with hx of severe cerebellar CVA / non-verbal status, SNF resident with trach / PEG readmitted 9/7 (after d/c 9/6) for worsening of hypoxic respiratory failure.  CXR concerning for increased L sided airspace disease and mucus plugging.    ASSESSMENT /  PLAN:  PULMONARY A: Acute Hypoxic Respiratory Failure LLL Mucus Plugging  Possible Aspiration - n/v on arrival to ER P:   PRVC 8 cc/kg  Wean PEEP / FiO2 for sats > 90% Overnight ventilation / likely can transition to ATC in am  3% nebulized saline for mucus clearance  Chest PT > bed/vest  May need FOB if conservative measures do not clear plugging  See ID   CARDIOVASCULAR A:  HTN  P:  SDU admission  Continue lopressor, clonidine, ASA  RENAL A:   CKD III P:   Trend BMP / urinary output Replace electrolytes as indicated Avoid nephrotoxic agents, ensure adequate renal perfusion  GASTROINTESTINAL A:   Nausea / Vomiting  Elevated Alk Phos - normal billi, likely from renal disease P:   NPO  PRN zofran  Consider TF in am  Hold home reglan  HEMATOLOGIC A:   Anemia  P:  Trend CBC Heparin + SCD's for DVT prophylaxis   INFECTIOUS A:   Suspected Aspiration PNA / LLL Mucus Plugging P:   Assess blood, sputum cultures UA reviewed, no UTI Empiric abx > this may all be poor mucociliary clearance   ENDOCRINE A:   At Risk Hyper/Hypoglycemia - NPO status  P:   Monitor glucose on BMP   NEUROLOGIC A:   Hx Severe Cerebellar CVA  P:   RASS goal: 0 PRN fentanyl for pain / evidence of discomfort    FAMILY  - Updates: Sister updated at bedside.  Reviewed plan of care.  Discussed concept of CPR and that it would not likely benefit in the event of arrest. Rather, it would likely contribute to further suffering.  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  She reports he was sitting on the side of the bed yesterday on arrival to the SNF.  Indicated that she should have further discussions with her family and that we will follow up on goals of care.    - Inter-disciplinary family meet or Palliative Care meeting due by:  9/14  CC Time: 58 minutes   Noe Gens, NP-C [facility] Pgr: [phone] or if no answer [phone] 12/22/2016, 1:21 PM  Attending Note:  I have  examined patient, reviewed labs, studies and notes. I have discussed the case with B Ollis, and I agree with the  data and plans as amended above. 46 man, hx trach and PEG following cerebellar CVA 10/2016. Deficits include aphasia, chronic aspiration. Admitted from SNF (only there for 1 day) with acute on chronic hypox resp failure, LLL atx / infiltrate, poor secretion cl;earance. Trach changed to cuffed in ED and placed on MV due to desaturations. On my eval he is a chronically debilitated man, non-verbal but awake. Trach in good position, cuffed and connected to MV. Lungs coarse B. Heart tachy and regular. Extremities are in support braces, deformities and post surgical changes in B LE and knees. We will admit to SDU for vent weaning. Hopeful vent needs will be short term if we can improve his secretion clearance. Treat empirically for HCAP. we discussed Alva with his sister at bedside. I have recommended that he would likely not benefit from or even survive CPR. She wants him to be full code, states that she will revisit the issue with other family.  Independent critical care time is 40 minutes.   Baltazar Apo, MD, PhD 12/22/2016, 3:10 PM [facility] [phone] or if no answer [phone]

## 2016-12-22 NOTE — Procedures (Signed)
Tracheostomy Change Note  Patient Details:   Name: Scott Rivera DOB: April 27, 1950 MRN: [id number]    Airway Documentation:     Evaluation  O2 sats: transiently fell during during procedure Complications: No apparent complications Patient did tolerate procedure well. Bilateral Breath Sounds: Rhonchi   Patient changed from uncuffed 6 shiley to cuffed 6 shiley. Patient tolerated well. No complications. Family and RN at bedside. RT will continue to monitor.    Mcneil Sober 12/22/2016, 12:25 PM

## 2016-12-22 NOTE — ED Provider Notes (Signed)
[facility] DEPT Provider Note   CSN: [id number] Arrival date & time: 12/22/16  1136     History   Chief Complaint Chief Complaint  Patient presents with  . Tracheostomy Tube Change  . Respiratory Distress    HPI Scott Rivera is a 65 y.o. male.  Patient with history of discharge from the hospital yesterday. He has a history of a severe stroke and has a PEG tube and a trach. He started coughing a lot and became very short of breath   The history is provided by medical records. No language interpreter was used.  Shortness of Breath  This is a recurrent problem. The problem occurs continuously.The current episode started 3 to 5 hours ago. Pertinent negatives include no fever. It is unknown what precipitated the problem. Risk factors: Has trach. He has tried nothing for the symptoms. The treatment provided significant relief. He has had prior hospitalizations. He has had prior ED visits. He has had prior ICU admissions. Associated medical issues include pneumonia.    Past Medical History:  Diagnosis Date  . Anemia due to chronic kidney disease   . Aspiration pneumonia ([place])   . Chronic kidney disease, stage 3 (moderate)   . Hypertension   . Stroke [facility])     Patient Active Problem List   Diagnosis Date Noted  . PNA (pneumonia) 12/22/2016  . Acute respiratory distress   . Fever   . Follow up   . HCAP (healthcare-associated pneumonia)   . Stage III pressure ulcer of sacral region ([place]) 12/17/2016  . Hypoxemia   . Tracheostomy status ([place])   . Pneumothorax   . Pneumothorax, closed, traumatic, initial encounter 12/10/2016  . Sepsis ([place]) 12/09/2016  . Hypotension 12/09/2016  . Acute-on-chronic kidney injury ([place]) 12/09/2016  . Elevated troponin 12/09/2016  . Anemia due to chronic kidney disease 12/09/2016  . Compensated metabolic alkalosis 73/71/0626  . Aspiration pneumonia ([place])   . Goals of care, counseling/discussion   . Palliative care by specialist   . Acute  respiratory failure ([place])   . Aspiration into airway   . Septic shock ([place])   . Dysphagia, post-stroke   . Benign essential HTN   . Tobacco abuse   . Tachypnea   . Hyperglycemia   . Hypernatremia   . Leukocytosis (leucocytosis)   . Acute blood loss anemia   . Chronic kidney disease   . Pressure injury of skin 10/20/2016  . Cerebellar infarct ([place]) 10/19/2016  . ARF (acute renal failure) ([place]) 10/19/2016  . Essential hypertension 10/19/2016  . Rhabdomyolysis 10/19/2016  . Acute ischemic stroke ([place]) 10/19/2016  . Bilateral chronic knee pain 01/07/2016  . History of adenomatous polyp of colon 07/28/2015  . Hyperlipidemia 07/28/2013  . Helicobacter pylori infection 09/21/2012    Past Surgical History:  Procedure Laterality Date  . IR GASTROSTOMY TUBE MOD SED  10/27/2016  . KNEE SURGERY    . TRACHEOSTOMY TUBE PLACEMENT         Home Medications    Prior to Admission medications   Medication Sig Start Date End Date Taking? Authorizing Provider  Amino Acids-Protein Hydrolys (FEEDING SUPPLEMENT, PRO-STAT SUGAR FREE 64,) LIQD Place 30 mLs into feeding tube 3 (three) times daily. 11/28/16  Yes Mariel Aloe, MD  amLODipine (NORVASC) 10 MG tablet Place 0.5 tablets (5 mg total) into feeding tube daily. Patient taking differently: Place 2.5 mg into feeding tube daily.  12/21/16  Yes Mikhail, Maryann, DO  amoxicillin-clavulanate (AUGMENTIN) 250-62.5 MG/5ML suspension Take 10 mLs  by mouth 2 (two) times daily.   Yes [provider]  aspirin 325 MG tablet Place 1 tablet (325 mg total) into feeding tube daily. 10/25/16  Yes Reyne Dumas, MD  bethanechol (URECHOLINE) 10 MG tablet Place 1 tablet (10 mg total) into feeding tube 3 (three) times daily. 10/25/16  Yes Reyne Dumas, MD  cloNIDine (CATAPRES) 0.1 MG tablet Place 1 tablet (0.1 mg total) into feeding tube 2 (two) times daily. 11/28/16  Yes Mariel Aloe, MD  clopidogrel (PLAVIX) 75 MG tablet Place 1 tablet (75 mg total) into  feeding tube daily. 10/25/16  Yes Reyne Dumas, MD  famotidine (PEPCID) 40 MG/5ML suspension Place 2.5 mLs (20 mg total) into feeding tube 2 (two) times daily. 11/28/16  Yes Mariel Aloe, MD  gabapentin (NEURONTIN) 100 MG capsule Take 200 mg by mouth at bedtime.   Yes [provider]  metoCLOPramide (REGLAN) 5 MG/5ML solution Place 10 mLs (10 mg total) into feeding tube 4 (four) times daily -  before meals and at bedtime. 11/28/16  Yes Mariel Aloe, MD  metoprolol tartrate (LOPRESSOR) 25 mg/10 mL SUSP Place 10 mLs (25 mg total) into feeding tube 2 (two) times daily. Patient taking differently: Place 10 mg into feeding tube 2 (two) times daily.  10/25/16  Yes Reyne Dumas, MD  Multiple Vitamin (MULTIVITAMIN WITH MINERALS) TABS tablet Place 1 tablet into feeding tube daily. 11/29/16  Yes Mariel Aloe, MD  nicotine (NICODERM CQ - DOSED IN MG/24 HOURS) 14 mg/24hr patch Place 1 patch (14 mg total) onto the skin daily. 10/25/16  Yes Reyne Dumas, MD  pravastatin (PRAVACHOL) 20 MG tablet Place 1 tablet (20 mg total) into feeding tube daily at 6 PM. 10/25/16  Yes Reyne Dumas, MD  senna (SENOKOT) 8.6 MG TABS tablet Place 1 tablet (8.6 mg total) into feeding tube 2 (two) times daily. 12/14/16  Yes Hosie Poisson, MD  thiamine 100 MG tablet Place 1 tablet (100 mg total) into feeding tube daily. 11/29/16  Yes Mariel Aloe, MD  Nutritional Supplements (FEEDING SUPPLEMENT, JEVITY 1.5 CAL/FIBER,) LIQD Place 1,000 mLs into feeding tube continuous. Patient not taking: Reported on 12/22/2016 12/14/16   Hosie Poisson, MD  traMADol (ULTRAM) 50 MG tablet Take 50 mg by mouth every 12 (twelve) hours as needed for severe pain.    [provider]  Water For Irrigation, Sterile (FREE WATER) SOLN Place 300 mLs into feeding tube every 4 (four) hours. Patient not taking: Reported on 12/22/2016 10/25/16   Reyne Dumas, MD    Family History Family History  Problem Relation Age of Onset  . Diabetes Mellitus  II Brother   . CAD Neg Hx   . Stroke Neg Hx     Social History Social History  Substance Use Topics  . Smoking status: Former Research scientist (life sciences)  . Smokeless tobacco: Never Used     Comment: Smoked heavily until the day of his stroke  . Alcohol use Yes     Comment: Drank heavily prior to stroke per sister     Allergies   Patient has no known allergies.   Review of Systems Review of Systems  Unable to perform ROS: Mental status change  Constitutional: Negative for fever.  Respiratory: Positive for shortness of breath.      Physical Exam Updated Vital Signs BP 118/73   Pulse (!) 146   Resp (!) 37   SpO2 90%   Physical Exam  Constitutional: He appears well-developed.  HENT:  Head: Normocephalic.  Patient with trach  Eyes: Conjunctivae and EOM are normal. No scleral icterus.  Neck: Neck supple. No thyromegaly present.  Cardiovascular: Normal rate and regular rhythm.  Exam reveals no gallop and no friction rub.   No murmur heard. Pulmonary/Chest: No stridor. He has wheezes. He has no rales. He exhibits no tenderness.  Abdominal: He exhibits no distension. There is no tenderness. There is no rebound.  Musculoskeletal: Normal range of motion. He exhibits no edema.  Lymphadenopathy:    He has no cervical adenopathy.  Skin: No rash noted. No erythema.     ED Treatments / Results  Labs (all labs ordered are listed, but only abnormal results are displayed) Labs Reviewed  COMPREHENSIVE METABOLIC PANEL - Abnormal; Notable for the following:       Result Value   Chloride 100 (*)    Glucose, Bld 128 (*)    BUN 33 (*)    Calcium 11.1 (*)    Total Protein 8.6 (*)    Albumin 2.0 (*)    Alkaline Phosphatase 171 (*)    All other components within normal limits  CBC WITH DIFFERENTIAL/PLATELET - Abnormal; Notable for the following:    WBC 24.9 (*)    RBC 3.47 (*)    Hemoglobin 10.0 (*)    HCT 31.5 (*)    RDW 16.6 (*)    Platelets 841 (*)    Neutro Abs 20.2 (*)    Monocytes  Absolute 2.3 (*)    All other components within normal limits  CULTURE, BLOOD (ROUTINE X 2)  CULTURE, BLOOD (ROUTINE X 2)  CULTURE, RESPIRATORY (NON-EXPECTORATED)  URINALYSIS, ROUTINE W REFLEX MICROSCOPIC  STREP PNEUMONIAE URINARY ANTIGEN  I-STAT CG4 LACTIC ACID, ED  I-STAT CG4 LACTIC ACID, ED    EKG  EKG Interpretation None       Radiology Dg Chest Port 1 View  Result Date: 12/22/2016 CLINICAL DATA:  Short of breath EXAM: PORTABLE CHEST 1 VIEW COMPARISON:  12/16/2016 FINDINGS: Tracheostomy in good position. Progressive consolidation left lung base since the prior study. Probable left effusion as well as volume loss. Left lower lobe bronchus cut off likely due to mucous plug or secretions. Improved aeration in the right lung base since the prior study. Right lung now clear IMPRESSION: Progressive collapse of the left lower lobe. Probable secretions or mucous plugging left lower lobe bronchus. Left effusion is present. Left lower lobe pneumonia not excluded. Electronically Signed   By: Franchot Gallo M.D.   On: 12/22/2016 13:04    Procedures Procedures (including critical care time)  Medications Ordered in ED Medications  0.9 %  sodium chloride infusion (not administered)  heparin injection 5,000 Units (not administered)  famotidine (PEPCID) IVPB 20 mg premix (not administered)  0.9 %  sodium chloride infusion (not administered)  acetaminophen (TYLENOL) tablet 650 mg (not administered)  ondansetron (ZOFRAN) injection 4 mg (not administered)  ipratropium-albuterol (DUONEB) 0.5-2.5 (3) MG/3ML nebulizer solution 3 mL (3 mLs Nebulization Given 12/22/16 1456)  fentaNYL (SUBLIMAZE) injection 50 mcg (not administered)  fentaNYL (SUBLIMAZE) injection 50 mcg (not administered)  sodium chloride HYPERTONIC 3 % nebulizer solution 4 mL (not administered)  aspirin tablet 325 mg (not administered)  thiamine tablet 100 mg (not administered)  clopidogrel (PLAVIX) tablet 75 mg (not administered)    famotidine (PEPCID) 40 MG/5ML suspension 20 mg (not administered)  gabapentin (NEURONTIN) capsule 200 mg (not administered)  multivitamin with minerals tablet 1 tablet (not administered)  pravastatin (PRAVACHOL) tablet 20 mg (not administered)  cloNIDine (CATAPRES) tablet  0.1 mg (not administered)  amLODipine (NORVASC) tablet 2.5 mg (not administered)  feeding supplement (PRO-STAT SUGAR FREE 64) liquid 30 mL (not administered)  metoprolol tartrate (LOPRESSOR) 25 mg/10 mL oral suspension 10 mg (not administered)  piperacillin-tazobactam (ZOSYN) IVPB 3.375 g (not administered)  vancomycin (VANCOCIN) IVPB 750 mg/150 ml premix (not administered)  sodium chloride 0.9 % bolus 2,000 mL (2,000 mLs Intravenous New Bag/Given 12/22/16 1244)  vancomycin (VANCOCIN) IVPB 1000 mg/200 mL premix (0 mg Intravenous Stopped 12/22/16 1430)  piperacillin-tazobactam (ZOSYN) IVPB 3.375 g (0 g Intravenous Stopped 12/22/16 1315)     Initial Impression / Assessment and Plan / ED Course  I have reviewed the triage vital signs and the nursing notes.  Pertinent labs & imaging results that were available during my care of the patient were reviewed by me and considered in my medical decision making (see chart for details).     CRITICAL CARE Performed by: Alannie Amodio L Total critical care time:40 minutes Critical care time was exclusive of separately billable procedures and treating other patients. Critical care was necessary to treat or prevent imminent or life-threatening deterioration. Critical care was time spent personally by me on the following activities: development of treatment plan with patient and/or surrogate as well as nursing, discussions with consultants, evaluation of patient's response to treatment, examination of patient, obtaining history from patient or surrogate, ordering and performing treatments and interventions, ordering and review of laboratory studies, ordering and review of radiographic studies,  pulse oximetry and re-evaluation of patient's condition.  Patient was still hypoxic after new trach was placed and on 100% oxygen. He was put back on the vent. Chest x-ray shows,  collapse of left lower lung possible pneumonia mucous plugging. Patient will be admitted by critical care Final Clinical Impressions(s) / ED Diagnoses   Final diagnoses:  Healthcare-associated pneumonia    New Prescriptions New Prescriptions   No medications on file     Milton Ferguson, MD 12/22/16 1516

## 2016-12-22 NOTE — ED Notes (Signed)
IV team at bedside at this time. 

## 2016-12-22 NOTE — Discharge Summary (Signed)
Physician Discharge Summary  HAO DION ACZ:[id number] DOB: [birth year] DOA: 12/09/2016  PCP: Scott Rivera, No Pcp Per  Admit date: 12/09/2016 Discharge date: 12/21/2016  Time spent: 45 minutes  Recommendations for Outpatient Follow-up:  Scott Rivera will be discharged to skilled nursing facility. Continue physical, occupational, and speech therapy.  Scott Rivera will need to follow up with primary care provider within one week of discharge, repeat CBC and BMP.  Scott Rivera should continue medications as prescribed.  Scott Rivera should continue peg tube feedings.  Discharge Diagnoses:  Sepsis secondary to aspiration pneumonitis Acute on chronic respiratory failure Acute kidney injury on chronic disease, stage III Anemia of chronic disease/acute illness Elevated troponin Traumatic pneumothorax History of CVA Severe protein malnutrition Essential hypertension Chronic leukocytosis Hypercalcemia Goals of care  Discharge Condition: Stable  Diet recommendation: Peg tube, jevity  Filed Weights   12/16/16 0435 12/17/16 0334 12/18/16 0236  Weight: 67.6 kg (149 lb 0.5 oz) 66.8 kg (147 lb 4.3 oz) 66.9 kg (147 lb 7.8 oz)    History of present illness:  On 12/09/2016 by Dr. Randa Spike MIQUEL Rivera is a 66 y.o. 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 He now presents with hypoxemia fever leukocytosis and what looks like tube feeds drainage from his trach indicative of severe sepsis with aspiration pneumonia.  He was initially admitted to [facility] on July 1 and transferred to our facility on July 5 and discharged on August 18. During that hospitalization he had difficulties with kidney injury, hypernatremia, and severe leukocytosis. At discharge his white blood cell count was  21. Thought to be due to underlying problem with leukocytosis and a hematology appointment was arranged. Please see below for further discussion of past medical history.  Although the history is obtained from the Scott Rivera's sister and the ER physician and the record. Scott Rivera is unable to provide any history whatsoever.  Today, [name] was found unresponsive in the early hours of this morning at the nursing facility with sats in the 80s. EMS was called and they concurred with the evaluation from the nursing facility on route he was noted to have absent breath sounds on the left and a needle decompression was performed on route. Nursing home staff reported to EMS that they had suctioned what appeared to be tube feeding out of his trach. On arrival to the emergency department he was found to be febrile to 101, he had a heart rate of 152, his blood pressure was 119/55 and his respiratory rate was 38. He received 2 L of fluid resuscitation and his blood pressure dropped to 96/54 but then started to improve.  Hospital Course:  Sepsis secondary to aspiration pneumonitis -Presented with leukocytosis, tachycardia, tachypnea -Scott Rivera was placed on zoysn and transitioned to Augmentin, however, began to have spike fevers and was placed back on zosyn -Continues to have thick copious secretions coming from trach -Currently using trach collar, back on 5 L- FiO2 28 and maintaining oxygen saturations in the 90s -Scott Rivera needs to be frequently suctioned. Signs to look for are tachypnea and decreased O2 saturations. Once Scott Rivera is suctionined, O2 saturations and respiratory rate improve -PCCM was consulted and has signed off -Blood cultures show no growth to date -Scott Rivera remained afebrile for the past 48 hours -will discharge with unasyn  Acute on chronic respiratory failure -Currently trach dependent  Acute kidney injury on chronic disease, stage III -Creatinine up to 1.88 on admission, back down to  0.98 -Was placed on IV fluids, likely secondary to sepsis -repeat BMP in one week  Anemia of chronic disease/acute illness -hemoglobin dropped to 7.3, was transfused 1u on 12/20/2016 -has received 3u total this admission -currently hemoglobin 8.7 -repeat CBC in one week  Elevated troponin -Likely demand ischemia in the setting of aspiration pneumonia and sepsis -Troponin trended downward, no acute changes noted on EKG  Traumatic pneumothorax -No mediastinal shift  -Currently back on 5 L, per trach collar an maintaining O2 sats in the 90s  History of CVA -Continue Plavix and aspirin, statin -PT consulted, recommended SNF  Severe protein malnutrition -Continue PEG feeds -Nutrition consulted  Essential hypertension -Continue clonidine, metoprolol  Chronic leukocytosis -Upon review of Epic, Scott Rivera had elevated leukocytosis since 10/19/2016 -May want to follow-up with oncology/hematology as an outpatient  Hypercalcemia -PTH WNL -Currently appears to be asymptomatic, resolved  Goals of care -Palliative care consulted and appreciated, discussed with Scott Rivera's sister, Scott Rivera remains full code at this point.  Procedures: Left pneumothorax needle decompression 12/09/16  Consultations: PCCM Palliative care  Discharge Exam: Vitals:   12/21/16 1300 12/21/16 1400  BP: (!) 99/57 (!) 123/57  Pulse: 83 79  Resp: (!) 24 (!) 25  Temp:    SpO2: 97% 98%     General: Well developed, chronically ill appearing, no distress  HEENT: NCAT,  mucous membranes moist.  Neck: Trach collar in place  Cardiovascular: S1 S2 auscultated, RRR  Respiratory:Coarse breath sounds, particularly upper airway (needs frequent suctioning)   Abdomen: Soft, nontender, nondistended, + bowel sounds, peg in place  Extremities: warm dry without cyanosis clubbing or edema  Neuro: awake and alert, cannot assess orientation. Moves extremities  Discharge Instructions Discharge Instructions     Diet - low sodium heart healthy    Complete by:  As directed    Discharge instructions    Complete by:  As directed    PLEASE FOLLOW UP with PCP in one week.  Please check CBC in one week, if wbc count still elevated, will need referral to hematologist.   Discharge instructions    Complete by:  As directed    Scott Rivera will be discharged to skilled nursing facility. Continue physical, occupational, and speech therapy.  Scott Rivera will need to follow up with primary care provider within one week of discharge, repeat CBC and BMP.  Scott Rivera should continue medications as prescribed.  Scott Rivera should continue peg tube feedings. Scott Rivera will need to be suctioned frequently/as needed.     Discharge Medication List as of 12/21/2016  2:39 PM    START taking these medications   Details  amoxicillin-clavulanate (AUGMENTIN) 250-62.5 MG/5ML suspension Place 10 mLs (500 mg total) into feeding tube every 12 (twelve) hours., Starting Thu 12/14/2016, Until Wed 12/20/2016, Normal    senna (SENOKOT) 8.6 MG TABS tablet Place 1 tablet (8.6 mg total) into feeding tube 2 (two) times daily., Starting Thu 12/14/2016, Normal      CONTINUE these medications which have CHANGED   Details  amLODipine (NORVASC) 10 MG tablet Place 0.5 tablets (5 mg total) into feeding tube daily., Starting Thu 12/21/2016, No Print    Nutritional Supplements (FEEDING SUPPLEMENT, JEVITY 1.5 CAL/FIBER,) LIQD Place 1,000 mLs into feeding tube continuous., Starting Thu 12/14/2016, No Print      CONTINUE these medications which have NOT CHANGED   Details  Amino Acids-Protein Hydrolys (FEEDING SUPPLEMENT, PRO-STAT SUGAR FREE 64,) LIQD Place  30 mLs into feeding tube 3 (three) times daily., Starting Tue 11/28/2016, No Print    aspirin 325 MG tablet Place 1 tablet (325 mg total) into feeding tube daily., Starting Wed 10/25/2016, Normal    bethanechol (URECHOLINE) 10 MG tablet Place 1 tablet (10 mg total) into feeding tube 3 (three) times daily., Starting  Wed 10/25/2016, Normal    cloNIDine (CATAPRES) 0.1 MG tablet Place 1 tablet (0.1 mg total) into feeding tube 2 (two) times daily., Starting Tue 11/28/2016, No Print    clopidogrel (PLAVIX) 75 MG tablet Place 1 tablet (75 mg total) into feeding tube daily., Starting Wed 10/25/2016, Normal    famotidine (PEPCID) 40 MG/5ML suspension Place 2.5 mLs (20 mg total) into feeding tube 2 (two) times daily., Starting Tue 11/28/2016, No Print    gabapentin (NEURONTIN) 100 MG capsule Take 200 mg by mouth at bedtime., Historical Med    metoCLOPramide (REGLAN) 5 MG/5ML solution Place 10 mLs (10 mg total) into feeding tube 4 (four) times daily -  before meals and at bedtime., Starting Tue 11/28/2016, No Print    metoprolol tartrate (LOPRESSOR) 25 mg/10 mL SUSP Place 10 mLs (25 mg total) into feeding tube 2 (two) times daily., Starting Wed 10/25/2016, Print    Multiple Vitamin (MULTIVITAMIN WITH MINERALS) TABS tablet Place 1 tablet into feeding tube daily., Starting Wed 11/29/2016, No Print    nicotine (NICODERM CQ - DOSED IN MG/24 HOURS) 14 mg/24hr patch Place 1 patch (14 mg total) onto the skin daily., Starting Wed 10/25/2016, Normal    pravastatin (PRAVACHOL) 20 MG tablet Place 1 tablet (20 mg total) into feeding tube daily at 6 PM., Starting Wed 10/25/2016, Normal    thiamine 100 MG tablet Place 1 tablet (100 mg total) into feeding tube daily., Starting Wed 11/29/2016, No Print    Water For Irrigation, Sterile (FREE WATER) SOLN Place 300 mLs into feeding tube every 4 (four) hours., Starting Wed 10/25/2016, Print      STOP taking these medications     traMADol (ULTRAM) 50 MG tablet        No Known Allergies  Contact information for follow-up providers    Primary care physician,at SNF Follow up in 1 day(s).   Why:  Hospital follow up           Contact information for after-discharge care    [facility] .   Specialty:  [place]  information: [address] [place] [phone]                   The results of significant diagnostics from this hospitalization (including imaging, microbiology, ancillary and laboratory) are listed below for reference.    Significant Diagnostic Studies: Dg Chest Port 1 View  Result Date: 12/16/2016 CLINICAL DATA:  Fever, tracheostomy EXAM: PORTABLE CHEST 1 VIEW COMPARISON:  12/15/2007 FINDINGS: Stable tracheostomy in the mid trachea. Normal heart size and vascularity. Mild bibasilar ill-defined patchy opacities as before. Little interval change. No new collapse or consolidation. No superimposed edema, effusion or pneumothorax. Atherosclerosis of aorta. IMPRESSION: Stable mild bibasilar airspace process versus atelectasis. No significant interval change. Electronically Signed   By: Jerilynn Mages.  Shick M.D.   On: 12/16/2016 14:10   Dg Chest Port 1 View  Result Date: 12/14/2016 CLINICAL DATA:  Acute respiratory distress EXAM: PORTABLE CHEST 1 VIEW COMPARISON:  12/14/2016, 12/13/2016, 12/12/2016 FINDINGS: Tracheostomy tube is in place. Continued patchy infiltrate at the left lung base. Suspect  tiny left effusion. Minimal atelectasis or infiltrate at the right base unchanged. Stable cardiomediastinal silhouette. No definitive pneumothorax is seen IMPRESSION: 1. Continued patchy infiltrates at the left greater than right lung base with suspected tiny left effusion. Electronically Signed   By: Donavan Foil M.D.   On: 12/14/2016 23:33   Dg Chest Port 1 View  Result Date: 12/14/2016 CLINICAL DATA:  Chronic ventilator dependent respiratory failure. Follow-up pneumothorax and follow-up bibasilar atelectasis versus pneumonia. EXAM: PORTABLE CHEST 1 VIEW COMPARISON:  12/13/2016, 12/12/2016 and earlier. FINDINGS: Tracheostomy tube tip in satisfactory position below the thoracic inlet. Cardiac silhouette normal in size for AP portable technique. No visible residual left apical  pneumothorax. Persistent airspace opacities in the left lung base. Improved aeration in the right lung base with only mild atelectasis persisting. No new pulmonary parenchymal abnormalities. Normal pulmonary vascularity. Dystrophic calcification in the right coracoclavicular ligament as noted previously. IMPRESSION: 1. Resolution of the previously identified left apical pneumothorax. 2. Stable pneumonia involving the left lung base. 3. Improved aeration in the right lung base with only mild atelectasis persisting. 4. No new abnormalities. Electronically Signed   By: Evangeline Dakin M.D.   On: 12/14/2016 13:37   Dg Chest Port 1 View  Result Date: 12/13/2016 CLINICAL DATA:  Tracheostomy Scott Rivera. Follow-up left apical pneumothorax. EXAM: PORTABLE CHEST 1 VIEW COMPARISON:  Portable chest x-ray of December 12, 2016 FINDINGS: There is a 5% or less apical pneumothorax on the left which appears stable. 3 surgical clips project over the left apex and also are unchanged in position. There is no pleural effusion. There is patchy density in the retrocardiac region on the left and in the right infrahilar region. No right-sided pneumothorax or pleural effusion is observed. The heart and pulmonary vascularity are normal. The tracheostomy tube tip projects at the inferior margin of the clavicular heads. IMPRESSION: Stable 5% or less left apical pneumothorax. Persistent bibasilar atelectasis or pneumonia. Electronically Signed   By: David  [place] M.D.   On: 12/13/2016 07:33   Dg Chest Port 1 View  Result Date: 12/12/2016 CLINICAL DATA:  Check tracheostomy placement EXAM: PORTABLE CHEST 1 VIEW COMPARISON:  12/11/2016 FINDINGS: Tracheostomy tube is again noted in satisfactory position. Cardiac shadow is stable. Left pneumothorax is again identified and stable. Patchy right basilar infiltrate is noted. No acute bony abnormality is seen. Chronic changes in the shoulder joints are noted. IMPRESSION: Stable left apical  pneumothorax. Mild patchy changes in the right base. Electronically Signed   By: Inez Catalina M.D.   On: 12/12/2016 07:36   Dg Chest Port 1 View  Result Date: 12/11/2016 CLINICAL DATA:  Pneumothorax. EXAM: PORTABLE CHEST 1 VIEW COMPARISON:  Chest x-ray 12/10/2016.  Chest x-ray 09/03/2016 FINDINGS: Tracheostomy tube noted in stable position. Cardiomegaly with pulmonary vascular prominence and bilateral interstitial prominence consistent with CHF. Findings have worsened slightly from prior exam . Pneumonitis cannot be excluded. No prominent pleural effusion. Small left pneumothorax is again noted without significant change. IMPRESSION: 1. Tracheostomy tube in stable position. 2.  Small left-sided pneumothorax noted without significant change. 3. Cardiomegaly with pulmonary vascular prominence and interstitial prominence suggesting CHF. Pneumonitis cannot be excluded. These findings have worsened slightly from prior exam. Electronically Signed   By: [place]   On: 12/11/2016 13:05   Portable Chest 1 View  Result Date: 12/10/2016 CLINICAL DATA:  Sepsis. EXAM: PORTABLE CHEST 1 VIEW COMPARISON:  12/09/2016 FINDINGS: 0631 hours. There is a new small left-sided pneumothorax. Persistent bibasilar airspace disease, similar to prior.  The cardiopericardial silhouette is within normal limits for size. Tracheostomy tube again noted. Telemetry leads overlie the chest. IMPRESSION: New small left pneumothorax. Critical Value/emergent results were called by telephone at the time of interpretation on 12/10/2016 at 8:22 am to Dr. Bonner Puna, who verbally acknowledged these results. Electronically Signed   By: Misty Stanley M.D.   On: 12/10/2016 08:24   Dg Chest Port 1 View  Result Date: 12/09/2016 CLINICAL DATA:  Pneumonia. EXAM: PORTABLE CHEST 1 VIEW COMPARISON:  Radiograph 11/20/2016 FINDINGS: Tracheostomy tube at the thoracic inlet. Worsening patchy right lung base opacity. Unchanged left basilar opacity. Upper lungs  are clear, the Scott Rivera's chin obscures the right lung apex. Unchanged heart size and mediastinal contours. No large pleural effusion. No pneumothorax. Tubular catheter like density projecting over the left upper chest is presumably external to the Scott Rivera. IMPRESSION: Increasing right lung base opacity. This may be pneumonia, aspiration or atelectasis. Left basilar opacity is unchanged. Electronically Signed   By: Jeb Levering M.D.   On: 12/09/2016 04:24   Dg Swallowing Func-speech Pathology  Result Date: 11/27/2016 Objective Swallowing Evaluation: Type of Study: MBS-Modified Barium Swallow Study Scott Rivera Details Name: ELHADJ GIRTON MRN: [id number] Date of Birth: 09-Apr-1951 Today's Date: 11/27/2016 Time: SLP Start Time (ACUTE ONLY): 1110-SLP Stop Time (ACUTE ONLY): 1140 SLP Time Calculation (min) (ACUTE ONLY): 30 min Past Medical History: Past Medical History: Diagnosis Date . Hypertension  . Stroke [facility])  Past Surgical History: Past Surgical History: Procedure Laterality Date . IR GASTROSTOMY TUBE MOD SED  10/27/2016 . KNEE SURGERY   HPI:  66 y.o. male with history of hypertension was brought to [facility] on 10/15/2016 after Scott Rivera was found to be confused. As per the family who provided most of the history Scott Rivera was not able to be raised and family went to check on him at home and was found on the floor confused. Scott Rivera was taken to the ER. The last contact was 2 days ago by Scott Rivera's brother prior to the episode. At [facility] MRI showed multiple acute infarcts involving the both hemisphere of the cerebellum and right occipital lobe right midbrain and left thalamus the fourth ventricle was patent no hydrocephalus. 2-D echo showing EF of 55-60% normal LV size. Carotid Doppler was done which showed right ICA 40-59% stenosis with left 60-79% stenosis. Chest x-ray was showing infiltrates and Scott Rivera also was febrile for which Scott Rivera was started on antibiotics.  Scott Rivera's creatinine at admission was 2.6 which improved to 1.5 the time of discharge. Scott Rivera also had rhabdomyolysis with CK of 7000 at the time of admission. As per the note Scott Rivera was initially agitated requiring Haldol at the other hospital. Scott Rivera's mentation slowly improved gradually and family requested transfer to [facility] after discussing with neurologist at [facility] Dr. Leonel Ramsay Subjective: pt drowsy, mostly keeps eyes closed Assessment / Plan / Recommendation CHL IP CLINICAL IMPRESSIONS 11/27/2016 Clinical Impression Pt currently with a moderate-severe oropharyngeal/ upper esophageal dysphagia, characterized by reduced posterior propulsion of bolus, tongue pumping, and premature spill to the level of the pyriform sinuses; decreased base of tongue retraction/ pharyngeal squeeze/ hyolaryngeal excursion resulted in penetration of thin liquids and puree consistencies which appeared to remain above the vocal folds. Pt had significant post-swallow residuals in the vallecula, pyriform sinuses, and at the UES along with moderate residuals that remained in the oral cavity. It appeared that the pt's spinal curvature/ structure was also impacting UES opening and resulted in some backflow  to the pharynx. Pt unable to follow verbal/ visual cues for a 2nd swallow. Larger boluses resulted in a more efficient swallow/ better clearance of residuals, although residuals were most significant with puree. Given these findings, pt remains at a severe risk of aspiration; recommend that pt remain NPO. Pt would benefit from continued dysphagia therapy; would recommend providing nectar-thick liquid trials with larger hand-over-hand cup sips to increase efficiency of swallow.  SLP Visit Diagnosis Dysphagia, oropharyngeal phase (R13.12) Attention and concentration deficit following -- Frontal lobe and executive function deficit following -- Impact on safety and function Severe aspiration risk;Risk for inadequate  nutrition/hydration   CHL IP TREATMENT RECOMMENDATION 11/27/2016 Treatment Recommendations Therapy as outlined in treatment plan below   Prognosis 11/27/2016 Prognosis for Safe Diet Advancement Good Barriers to Reach Goals Cognitive deficits;Time post onset Barriers/Prognosis Comment -- CHL IP DIET RECOMMENDATION 11/27/2016 SLP Diet Recommendations NPO Liquid Administration via -- Medication Administration Via alternative means Compensations -- Postural Changes --   CHL IP OTHER RECOMMENDATIONS 11/27/2016 Recommended Consults -- Oral Care Recommendations Oral care QID Other Recommendations --   CHL IP FOLLOW UP RECOMMENDATIONS 11/27/2016 Follow up Recommendations Skilled Nursing facility   [facility] IP FREQUENCY AND DURATION 11/27/2016 Speech Therapy Frequency (ACUTE ONLY) min 3x week Treatment Duration 2 weeks      CHL IP ORAL PHASE 11/27/2016 Oral Phase Impaired Oral - Pudding Teaspoon -- Oral - Pudding Cup -- Oral - Honey Teaspoon -- Oral - Honey Cup -- Oral - Nectar Teaspoon -- Oral - Nectar Cup -- Oral - Nectar Straw -- Oral - Thin Teaspoon Left anterior bolus loss;Right anterior bolus loss;Weak lingual manipulation;Reduced posterior propulsion;Lingual/palatal residue;Delayed oral transit;Premature spillage Oral - Thin Cup Left anterior bolus loss;Right anterior bolus loss;Weak lingual manipulation;Reduced posterior propulsion;Lingual/palatal residue;Delayed oral transit;Premature spillage Oral - Thin Straw -- Oral - Puree Weak lingual manipulation;Lingual pumping;Reduced posterior propulsion;Lingual/palatal residue;Delayed oral transit;Decreased bolus cohesion;Premature spillage Oral - Mech Soft -- Oral - Regular -- Oral - Multi-Consistency -- Oral - Pill -- Oral Phase - Comment --  CHL IP PHARYNGEAL PHASE 11/27/2016 Pharyngeal Phase Impaired Pharyngeal- Pudding Teaspoon -- Pharyngeal -- Pharyngeal- Pudding Cup -- Pharyngeal -- Pharyngeal- Honey Teaspoon -- Pharyngeal -- Pharyngeal- Honey Cup -- Pharyngeal -- Pharyngeal-  Nectar Teaspoon -- Pharyngeal -- Pharyngeal- Nectar Cup -- Pharyngeal -- Pharyngeal- Nectar Straw -- Pharyngeal -- Pharyngeal- Thin Teaspoon Delayed swallow initiation-pyriform sinuses;Reduced pharyngeal peristalsis;Reduced epiglottic inversion;Reduced anterior laryngeal mobility;Reduced laryngeal elevation;Reduced tongue base retraction;Penetration/Aspiration during swallow;Pharyngeal residue - valleculae;Pharyngeal residue - pyriform;Pharyngeal residue - cp segment Pharyngeal Material enters airway, remains ABOVE vocal cords and not ejected out Pharyngeal- Thin Cup Delayed swallow initiation-pyriform sinuses;Reduced pharyngeal peristalsis;Reduced epiglottic inversion;Reduced anterior laryngeal mobility;Reduced laryngeal elevation;Reduced tongue base retraction;Penetration/Aspiration during swallow;Pharyngeal residue - valleculae;Pharyngeal residue - pyriform;Pharyngeal residue - cp segment Pharyngeal Material enters airway, remains ABOVE vocal cords and not ejected out Pharyngeal- Thin Straw -- Pharyngeal -- Pharyngeal- Puree Delayed swallow initiation-vallecula;Reduced pharyngeal peristalsis;Reduced epiglottic inversion;Reduced anterior laryngeal mobility;Reduced laryngeal elevation;Reduced tongue base retraction;Penetration/Aspiration during swallow;Pharyngeal residue - valleculae;Pharyngeal residue - pyriform;Pharyngeal residue - posterior pharnyx;Pharyngeal residue - cp segment Pharyngeal Material enters airway, remains ABOVE vocal cords and not ejected out Pharyngeal- Mechanical Soft -- Pharyngeal -- Pharyngeal- Regular -- Pharyngeal -- Pharyngeal- Multi-consistency -- Pharyngeal -- Pharyngeal- Pill -- Pharyngeal -- Pharyngeal Comment --  CHL IP CERVICAL ESOPHAGEAL PHASE 11/27/2016 Cervical Esophageal Phase Impaired Pudding Teaspoon -- Pudding Cup -- Honey Teaspoon -- Honey Cup -- Nectar Teaspoon -- Nectar Cup -- Nectar Straw -- Thin Teaspoon WFL Thin Cup Esophageal backflow into  the pharynx Thin Straw --  Puree Esophageal backflow into the pharynx Mechanical Soft -- Regular -- Multi-consistency -- Pill -- Cervical Esophageal Comment -- CHL IP GO 10/24/2016 Functional Assessment Tool Used NOMS; clinical judgment Functional Limitations Swallowing Swallow Current Status (F5732) CK Swallow Goal Status (K0254) CJ Swallow Discharge Status (Y7062) (None) Motor Speech Current Status (B7628) (None) Motor Speech Goal Status (B1517) (None) Motor Speech Goal Status (O1607) (None) Spoken Language Comprehension Current Status (P7106) (None) Spoken Language Comprehension Goal Status (Y6948) (None) Spoken Language Comprehension Discharge Status [phone]) (None) Spoken Language Expression Current Status [phone]) (None) Spoken Language Expression Goal Status (X3818) (None) Spoken Language Expression Discharge Status [phone]) (None) Attention Current Status (J6967) (None) Attention Goal Status (E9381) (None) Attention Discharge Status [phone]) (None) Memory Current Status (W2585) (None) Memory Goal Status (I7782) (None) Memory Discharge Status (U2353) (None) Voice Current Status (I1443) (None) Voice Goal Status (X5400) (None) Voice Discharge Status [phone]) (None) Other Speech-Language Pathology Functional Limitation Current Status [phone]) (None) Other Speech-Language Pathology Functional Limitation Goal Status [phone]) (None) Other Speech-Language Pathology Functional Limitation Discharge Status [phone]) (None) Amy Dionicia Abler, MA, CCC-SLP 11/27/2016, 12:15 PM [phone]               Microbiology: Recent Results (from the past 240 hour(s))  Culture, blood (Routine X 2) w Reflex to ID Panel     Status: None   Collection Time: 12/16/16 12:32 PM  Result Value Ref Range Status   Specimen Description BLOOD RIGHT HAND  Final   Special Requests IN PEDIATRIC BOTTLE Blood Culture adequate volume  Final   Culture NO GROWTH 5 DAYS  Final   Report Status 12/21/2016 FINAL  Final  Culture, blood (Routine X 2) w Reflex to ID Panel     Status: None    Collection Time: 12/16/16 12:33 PM  Result Value Ref Range Status   Specimen Description BLOOD RIGHT ANTECUBITAL  Final   Special Requests IN PEDIATRIC BOTTLE Blood Culture adequate volume  Final   Culture NO GROWTH 5 DAYS  Final   Report Status 12/21/2016 FINAL  Final     Labs: Basic Metabolic Panel:  Recent Labs Lab 12/16/16 0535 12/20/16 0358 12/21/16 0633  NA 141 138 135  K 4.0 4.5 5.1  CL 105 101 99*  CO2 27 28 25   GLUCOSE 138* 107* 116*  BUN 25* 30* 29*  CREATININE 0.96 1.06 0.98  CALCIUM 10.2  10.2 10.5* 10.5*   Liver Function Tests: No results for input(s): AST, ALT, ALKPHOS, BILITOT, PROT, ALBUMIN in the last 168 hours. No results for input(s): LIPASE, AMYLASE in the last 168 hours. No results for input(s): AMMONIA in the last 168 hours. CBC:  Recent Labs Lab 12/16/16 1232 12/18/16 1130 12/20/16 0358 12/20/16 1919 12/21/16 0633  WBC 18.6* 17.4* 16.8*  --  17.2*  HGB 8.2* 7.5* 7.3* 8.4* 8.7*  HCT 26.3* 24.1* 24.0* 26.4* 27.9*  MCV 91.0 92.0 92.7  --  90.6  PLT 483* 572* 640*  --  692*   Cardiac Enzymes: No results for input(s): CKTOTAL, CKMB, CKMBINDEX, TROPONINI in the last 168 hours. BNP: BNP (last 3 results)  Recent Labs  12/09/16 0351  BNP 18.0    ProBNP (last 3 results) No results for input(s): PROBNP in the last 8760 hours.  CBG:  Recent Labs Lab 12/20/16 1947 12/20/16 2340 12/21/16 0409 12/21/16 0749 12/21/16 1234  GLUCAP 122* 113* 121* 102* 129*       Signed:  Angalena Cousineau  [facility] 12/22/2016, 9:52 AM

## 2016-12-22 NOTE — ED Notes (Signed)
Spoke with Admission MD; informed him about patient's bouts of restlessness.  Will order fentanyl drip to maintain  RASS score of -2 (light sedation).

## 2016-12-22 NOTE — Progress Notes (Signed)
RT suctioned copious amounts of secretions from patient. Patient also vomited. RT will continue to monitor.

## 2016-12-22 NOTE — ED Notes (Signed)
Blood cultures x 2 have now been drawn.

## 2016-12-22 NOTE — ED Triage Notes (Signed)
Pt arrives from nursing facility for increase trach secretions and low o2 sat's in 60'2. Pt was just discharge 1 day prior after being treated for Sepsis secondary to aspiration pneumonitis. Pt arrives alert non verbal and coughing up copious amounts of thick yellow sputum. Respiratory changed trach to cuffed 6 and placed on vent.

## 2016-12-23 ENCOUNTER — Inpatient Hospital Stay (HOSPITAL_COMMUNITY): Payer: Medicare HMO

## 2016-12-23 LAB — GLUCOSE, CAPILLARY
Glucose-Capillary: 103 mg/dL — ABNORMAL HIGH (ref 65–99)
Glucose-Capillary: 119 mg/dL — ABNORMAL HIGH (ref 65–99)
Glucose-Capillary: 115 mg/dL — ABNORMAL HIGH (ref 65–99)
Glucose-Capillary: 116 mg/dL — ABNORMAL HIGH (ref 65–99)

## 2016-12-23 LAB — I-STAT ARTERIAL BLOOD GAS, ED
Acid-Base Excess: 3 mmol/L — ABNORMAL HIGH (ref 0.0–2.0)
Bicarbonate: 26.4 mmol/L (ref 20.0–28.0)
O2 Saturation: 99 %
Patient temperature: 98.6
TCO2: 27 mmol/L (ref 22–32)
pCO2 arterial: 35 mmHg (ref 32.0–48.0)
pH, Arterial: 7.484 — ABNORMAL HIGH (ref 7.350–7.450)
pO2, Arterial: 120 mmHg — ABNORMAL HIGH (ref 83.0–108.0)

## 2016-12-23 LAB — CBC
HCT: 25.8 % — ABNORMAL LOW (ref 39.0–52.0)
Hemoglobin: 7.9 g/dL — ABNORMAL LOW (ref 13.0–17.0)
MCH: 27.9 pg (ref 26.0–34.0)
MCHC: 30.6 g/dL (ref 30.0–36.0)
MCV: 91.2 fL (ref 78.0–100.0)
Platelets: 704 10*3/uL — ABNORMAL HIGH (ref 150–400)
RBC: 2.83 MIL/uL — ABNORMAL LOW (ref 4.22–5.81)
RDW: 16.7 % — ABNORMAL HIGH (ref 11.5–15.5)
WBC: 21.9 10*3/uL — ABNORMAL HIGH (ref 4.0–10.5)

## 2016-12-23 LAB — MAGNESIUM
Magnesium: 1.9 mg/dL (ref 1.7–2.4)
Magnesium: 2 mg/dL (ref 1.7–2.4)
Magnesium: 2 mg/dL (ref 1.7–2.4)

## 2016-12-23 LAB — BASIC METABOLIC PANEL
Anion gap: 10 (ref 5–15)
Anion gap: 9 (ref 5–15)
BUN: 33 mg/dL — ABNORMAL HIGH (ref 6–20)
BUN: 32 mg/dL — ABNORMAL HIGH (ref 6–20)
CO2: 24 mmol/L (ref 22–32)
CO2: 23 mmol/L (ref 22–32)
Calcium: 10.2 mg/dL (ref 8.9–10.3)
Calcium: 10.1 mg/dL (ref 8.9–10.3)
Chloride: 105 mmol/L (ref 101–111)
Chloride: 106 mmol/L (ref 101–111)
Creatinine, Ser: 1.2 mg/dL (ref 0.61–1.24)
Creatinine, Ser: 1.17 mg/dL (ref 0.61–1.24)
GFR calc Af Amer: >60 mL/min (ref >60)
GFR calc Af Amer: >60 mL/min (ref >60)
GFR calc non Af Amer: >60 mL/min (ref >60)
GFR calc non Af Amer: >60 mL/min (ref >60)
Glucose, Bld: 102 mg/dL — ABNORMAL HIGH (ref 65–99)
Glucose, Bld: 119 mg/dL — ABNORMAL HIGH (ref 65–99)
Potassium: 4.2 mmol/L (ref 3.5–5.1)
Potassium: 3.9 mmol/L (ref 3.5–5.1)
Sodium: 139 mmol/L (ref 135–145)
Sodium: 138 mmol/L (ref 135–145)

## 2016-12-23 LAB — TROPONIN I: Troponin I: <0.03 ng/mL (ref <0.03)

## 2016-12-23 LAB — PHOSPHORUS
Phosphorus: 3.5 mg/dL (ref 2.5–4.6)
Phosphorus: 3.7 mg/dL (ref 2.5–4.6)
Phosphorus: 3.2 mg/dL (ref 2.5–4.6)

## 2016-12-23 MED ORDER — VITAL HIGH PROTEIN PO LIQD: 1000.0000 mL | ORAL | Status: DC

## 2016-12-23 MED ORDER — CHLORHEXIDINE GLUCONATE 0.12% ORAL RINSE (MEDLINE KIT)
15.0000 mL | Freq: Two times a day (BID) | OROMUCOSAL | Status: DC
  Administered 2016-12-23 – 2017-01-10 (×34): 15 mL via OROMUCOSAL

## 2016-12-23 MED ORDER — ORAL CARE MOUTH RINSE
15.0000 mL | Freq: Four times a day (QID) | OROMUCOSAL | Status: DC
  Administered 2016-12-23 – 2017-01-10 (×62): 15 mL via OROMUCOSAL

## 2016-12-23 MED ORDER — JEVITY 1.2 CAL PO LIQD
1000.0000 mL | ORAL | Status: AC
  Administered 2016-12-23 – 2016-12-30 (×8): 1000 mL
  Administered 2016-12-31: 11:00:00
  Administered 2017-01-01: 1000 mL
  Filled 2016-12-23 (×19): qty 1000

## 2016-12-23 MED ORDER — FAMOTIDINE 40 MG/5ML PO SUSR
20.0000 mg | Freq: Two times a day (BID) | ORAL | Status: DC
  Administered 2016-12-23 – 2017-01-10 (×35): 20 mg
  Filled 2016-12-23 (×38): qty 2.5

## 2016-12-23 MED ORDER — PRO-STAT SUGAR FREE PO LIQD
30.0000 mL | Freq: Three times a day (TID) | ORAL | Status: DC
  Administered 2016-12-23 – 2017-01-08 (×50): 30 mL
  Filled 2016-12-23 (×53): qty 30

## 2016-12-23 MED ORDER — ACETAMINOPHEN 325 MG PO TABS: 650.0000 mg | ORAL_TABLET | Freq: Four times a day (QID) | ORAL | Status: DC | PRN

## 2016-12-23 MED ORDER — PRO-STAT SUGAR FREE PO LIQD: 30.0000 mL | Freq: Two times a day (BID) | ORAL | Status: DC

## 2016-12-23 NOTE — ED Notes (Signed)
Attempted to call report.  No answer on unit. 

## 2016-12-23 NOTE — Progress Notes (Signed)
eLink Physician-Brief Progress Note Patient Name: VIREN LEBEAU DOB: Apr 17, 1951 MRN: [id number]   Date of Service  12/23/2016  HPI/Events of Note  NSVT - 17 beat run --> spontaneous conversion to NSR.   eICU Interventions  Will order: 1. BMP STAT. 2. Cycle Troponin.        Kristilyn Coltrane Cornelia Copa 12/23/2016, 9:33 PM

## 2016-12-23 NOTE — Progress Notes (Signed)
Pt arrived to unit during change of shift. Pt vitals stable. Report given to oncoming nurse Colletta [place].

## 2016-12-23 NOTE — Progress Notes (Signed)
Initial Nutrition Assessment  DOCUMENTATION CODES:   Severe malnutrition in context of chronic illness  INTERVENTION:   Tube Feeding:   Recommend Jevity 1.2 @ 50 ml/hr with Pro-Stat 30 mL TID providing 1740 kcals, 112 g of protein and 972 of free water. PEPuP appropriate. Meets 103% estimated calorie needs, 100% protein needs.   NUTRITION DIAGNOSIS:   Malnutrition (Severe) related to chronic illness as evidenced by percent weight loss, severe depletion of muscle mass, severe depletion of body fat.  GOAL:   Provide needs based on ASPEN/SCCM guidelines  MONITOR:   TF tolerance, Skin, Labs, Weight trends, Vent status  REASON FOR ASSESSMENT:   Ventilator    ASSESSMENT:    66 yo male admitted with acute respiratory failure with LLL mucous plugging, possible aspiration with N/V on arrival to ER. 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 Pt with additional hx of CKD III, HTN   Patient is currently on ventilator support via trach MV: 9.4 L/min Temp (24hrs), Avg:98.8 F (37.1 C), Min:98.8 F (37.1 C), Max:98.8 F (37.1 C)  No family at bedside. Per Home Med list, pt has been receiving Jevity 1.5 via G-tube PTA.   Nutrition-Focused physical exam completed. Findings are mild/moderate to severe fat depletion, mild/moderate to severe muscle depletion, and mild edema.   Per weight encounters, weight down 10 pound since 8/15 (6.4% wt loss in <1 month which is significant for time frame). No other weight encounters in computer. Per RD chart review, pt with wt loss since July but weight gain? (noted some edema) since 8/25  Pt met clinical characteristics for severe malnutrition in context of chronic illness on recent admission in August  Labs: reviewed Meds: NS at 50 ml/hr, MVI, thiamine  Diet Order:  Diet NPO time specified  Skin:  Wound (see comment) (unstageable sacrum)  Last BM:  no documented BM since  admission  Height:   Ht Readings from Last 1 Encounters:  12/23/16 5' 11"  (1.803 m)    Weight:   Wt Readings from Last 1 Encounters:  12/23/16 147 lb (66.7 kg)    BMI:  Body mass index is 20.5 kg/m.  Estimated Nutritional Needs:   Kcal:  4888 kcals   Protein:  100-134 g  Fluid:  >/= 2 L  EDUCATION NEEDS:   No education needs identified at this time   [place], [place], LDN [phone] Pager  [phone] Weekend/On-Call Pager

## 2016-12-23 NOTE — Progress Notes (Signed)
PULMONARY / CRITICAL CARE MEDICINE   Name: Scott Rivera MRN: [id number] DOB: 1951-04-06    ADMISSION DATE:  12/22/2016 CONSULTATION DATE:  12/22/16  REFERRING MD:  Dr. Roderic Palau / EDP   CHIEF COMPLAINT:  Low O2 saturations   HISTORY OF PRESENT ILLNESS:   66 y/o M who presented with acute hypoxic respiratory failure.  Baseline nonverbal, trach / PEG and lives in a SNF since July 2018 after CVA.   Recent admit from 8/25-9/6 for sepsis in the setting of aspiration PNA, AKI. He was discharged 9/6 to a SNF for care.  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      The patient returns 9/7 with reports of low oxygen saturations (60's) at the SNF.  He was reportedly found with increased secretions, thick yellow sputum and tachycardia.  On arrival to the ER, the patient vomited on the staff.  Initial labs - Na 137, K 4.4, Cl 100, CO2 26, glucose 128, BUN 33, Sr Cr 1.10, alk phos 171, albumin 2.0, AST 25, ALT 39, T. Bilirubin 0.7, lactic acid 1.31, WBC 24.9, Hgb 10 and platelets 841.  CXR showed LLL mucus plugging with cut off sign and airspace disease.  Attempts at suctioning the patient were made without improvement in saturations.  Uncuffed trach was changed to a cuffed and the patient was placed on mechanical ventilation.  PCCM called for ICU admission.   SUBJECTIVE:   tolerating pressure support.  VITAL SIGNS: BP 122/65   Pulse 100   Temp 98.8 F (37.1 C) (Oral)   Resp 14   Ht 5' 11"  (1.803 m)   Wt 147 lb (66.7 kg)   SpO2 98%   BMI 20.50 kg/m   VENTILATOR SETTINGS: Vent Mode: PSV;CPAP FiO2 (%):  [40 %-100 %] 40 % Set Rate:  [14 bmp] 14 bmp Vt Set:  [500 mL-600 mL] 600 mL PEEP:  [5 cmH20-8 cmH20] 5 cmH20 Pressure Support:  [10 cmH20] 10 cmH20 Plateau Pressure:  [22 ZSW10-93 cmH20] 24 cmH20  INTAKE / OUTPUT: I/O last 3 completed  shifts: In: 2650 [I.V.:150; IV Piggyback:2500] Out: -   PHYSICAL EXAMINATION:  General - on vent Eyes - pupils reactive ENT - trach site clean Cardiac - regular, no murmur Chest - decreased BS Lt base Abd - soft, non tender, peg site clean Ext - 1+ edema, contracted Skin - stage 4 sacral wound >> present prior to admission Neuro - doesn't follow commands   LABS:  BMET  Recent Labs Lab 12/21/16 0633 12/22/16 1217 12/23/16 0542  NA 135 137 139  K 5.1 4.4 4.2  CL 99* 100* 105  CO2 25 26 24   BUN 29* 33* 33*  CREATININE 0.98 1.10 1.20  GLUCOSE 116* 128* 102*    Electrolytes  Recent Labs Lab 12/21/16 0633 12/22/16 1217 12/23/16 0542  CALCIUM 10.5* 11.1* 10.2  MG  --   --  1.9  PHOS  --   --  3.5    CBC  Recent Labs Lab 12/21/16 0633 12/22/16 1217 12/23/16 0542  WBC 17.2* 24.9* 21.9*  HGB 8.7* 10.0* 7.9*  HCT 27.9* 31.5* 25.8*  PLT 692* 841* 704*    Coag's No results for input(s): APTT, INR in the last 168 hours.  Sepsis Markers  Recent Labs Lab 12/22/16 1227  LATICACIDVEN 1.31    ABG  Recent Labs Lab 12/23/16 [phone]  PHART 7.484*  PCO2ART 35.0  PO2ART 120.0*    Liver Enzymes  Recent Labs Lab 12/22/16 1217  AST 25  ALT 39  ALKPHOS 171*  BILITOT 0.7  ALBUMIN 2.0*    Cardiac Enzymes No results for input(s): TROPONINI, PROBNP in the last 168 hours.  Glucose  Recent Labs Lab 12/20/16 1947 12/20/16 2340 12/21/16 0409 12/21/16 0749 12/21/16 1234 12/23/16 0927  GLUCAP 122* 113* 121* 102* 129* 103*    Imaging Dg Chest Port 1 View  Result Date: 12/23/2016 CLINICAL DATA:  Left lower lobe aspiration with mucous plugging. EXAM: PORTABLE CHEST 1 VIEW COMPARISON:  12/22/2016 FINDINGS: Endotracheal tube remains in place without change. Heart size and pulmonary vascularity are normal. Infiltrates in the left lung base with volume loss likely representing pneumonia. Probable small left pleural effusion. No significant change since  previous study, lying for differences in technique. Right lung is clear. IMPRESSION: Consolidation and volume loss in the left lung base with small left pleural effusion likely representing pneumonia. Endotracheal tube in place. Electronically Signed   By: Lucienne Capers M.D.   On: 12/23/2016 06:27   Dg Chest Port 1 View  Result Date: 12/22/2016 CLINICAL DATA:  Short of breath EXAM: PORTABLE CHEST 1 VIEW COMPARISON:  12/16/2016 FINDINGS: Tracheostomy in good position. Progressive consolidation left lung base since the prior study. Probable left effusion as well as volume loss. Left lower lobe bronchus cut off likely due to mucous plug or secretions. Improved aeration in the right lung base since the prior study. Right lung now clear IMPRESSION: Progressive collapse of the left lower lobe. Probable secretions or mucous plugging left lower lobe bronchus. Left effusion is present. Left lower lobe pneumonia not excluded. Electronically Signed   By: Franchot Gallo M.D.   On: 12/22/2016 13:04     STUDIES:   CULTURES: BCx2 9/7 >>  Tracheal Aspirate 9/7 >>  Strep Antigen 9/7 >>   ANTIBIOTICS: Vanco 9/7 >>  Zosyn 9/7 >>   SIGNIFICANT EVENTS: 9/06  Discharge after admit with aspiration PNA, AKI 9/07  Re-admit with acute hypoxic respiratory failure   LINES/TUBES: Trach (cuffed changed) 9/7 >>  DISCUSSION: 66 y/o M with hx of severe cerebellar CVA / non-verbal status, SNF resident with trach / PEG readmitted 9/7 (after d/c 9/6) for worsening of hypoxic respiratory failure.  CXR concerning for increased L sided airspace disease and mucus plugging.    ASSESSMENT / PLAN:  Acute on chronic respiratory failure. Aspiration pneumonia with mucus plugging. Tracheostomy status. - bronchial hygiene - pressure support wean  - f/u CXR - continue Abx  Hx of HTN. - lopressor, clonidine, ASA  CKD 3. - monitor renal fx  Dysphagia. - tube feeds  Anemia of critical illness and chronic disease. -  f/u CBC  Hx of cerebellar CVA with vegetative state. - monitor neuro status  Stage 4 sacral wound. - present prior to admission  DVT prophylaxis - SQ heparin SUP - pepcid Nutrition - tube feeds Goals of care - full code  Chesley Mires, MD [place] 12/23/2016, 10:21 AM Pager:  [phone] After 3pm call: [phone]

## 2016-12-24 DIAGNOSIS — J9621 Acute and chronic respiratory failure with hypoxia: Secondary | ICD-10-CM

## 2016-12-24 LAB — BASIC METABOLIC PANEL
Anion gap: 8 (ref 5–15)
BUN: 31 mg/dL — ABNORMAL HIGH (ref 6–20)
CO2: 24 mmol/L (ref 22–32)
Calcium: 10 mg/dL (ref 8.9–10.3)
Chloride: 106 mmol/L (ref 101–111)
Creatinine, Ser: 1.15 mg/dL (ref 0.61–1.24)
GFR calc Af Amer: >60 mL/min (ref >60)
GFR calc non Af Amer: >60 mL/min (ref >60)
Glucose, Bld: 103 mg/dL — ABNORMAL HIGH (ref 65–99)
Potassium: 4 mmol/L (ref 3.5–5.1)
Sodium: 138 mmol/L (ref 135–145)

## 2016-12-24 LAB — GLUCOSE, CAPILLARY
Glucose-Capillary: 114 mg/dL — ABNORMAL HIGH (ref 65–99)
Glucose-Capillary: 106 mg/dL — ABNORMAL HIGH (ref 65–99)
Glucose-Capillary: 127 mg/dL — ABNORMAL HIGH (ref 65–99)
Glucose-Capillary: 155 mg/dL — ABNORMAL HIGH (ref 65–99)
Glucose-Capillary: 116 mg/dL — ABNORMAL HIGH (ref 65–99)
Glucose-Capillary: 96 mg/dL (ref 65–99)
Glucose-Capillary: 112 mg/dL — ABNORMAL HIGH (ref 65–99)

## 2016-12-24 LAB — CBC
HCT: 23.9 % — ABNORMAL LOW (ref 39.0–52.0)
Hemoglobin: 7.3 g/dL — ABNORMAL LOW (ref 13.0–17.0)
MCH: 28.4 pg (ref 26.0–34.0)
MCHC: 30.5 g/dL (ref 30.0–36.0)
MCV: 93 fL (ref 78.0–100.0)
Platelets: 685 10*3/uL — ABNORMAL HIGH (ref 150–400)
RBC: 2.57 MIL/uL — ABNORMAL LOW (ref 4.22–5.81)
RDW: 17 % — ABNORMAL HIGH (ref 11.5–15.5)
WBC: 17.6 10*3/uL — ABNORMAL HIGH (ref 4.0–10.5)

## 2016-12-24 LAB — MAGNESIUM
Magnesium: 2.1 mg/dL (ref 1.7–2.4)
Magnesium: 2 mg/dL (ref 1.7–2.4)

## 2016-12-24 LAB — PHOSPHORUS
Phosphorus: 3.3 mg/dL (ref 2.5–4.6)
Phosphorus: 3.3 mg/dL (ref 2.5–4.6)

## 2016-12-24 LAB — TROPONIN I
Troponin I: <0.03 ng/mL (ref <0.03)
Troponin I: 0.03 ng/mL (ref <0.03)

## 2016-12-24 NOTE — Progress Notes (Signed)
PULMONARY / CRITICAL CARE MEDICINE   Name: Scott Rivera MRN: [id number] DOB: 1950/05/08    ADMISSION DATE:  12/22/2016 CONSULTATION DATE:  12/22/16  REFERRING MD:  Dr. Roderic Palau / EDP   CHIEF COMPLAINT:  Low O2 saturations   HISTORY OF PRESENT ILLNESS:   66 y/o M who presented with acute hypoxic respiratory failure.  Baseline nonverbal, trach / PEG and lives in a SNF since July 2018 after CVA.   Recent admit from 8/25-9/6 for sepsis in the setting of aspiration PNA, AKI. He was discharged 9/6 to a SNF for care.  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      The patient returns 9/7 with reports of low oxygen saturations (60's) at the SNF.  He was reportedly found with increased secretions, thick yellow sputum and tachycardia.  On arrival to the ER, the patient vomited on the staff.  Initial labs - Na 137, K 4.4, Cl 100, CO2 26, glucose 128, BUN 33, Sr Cr 1.10, alk phos 171, albumin 2.0, AST 25, ALT 39, T. Bilirubin 0.7, lactic acid 1.31, WBC 24.9, Hgb 10 and platelets 841.  CXR showed LLL mucus plugging with cut off sign and airspace disease.  Attempts at suctioning the patient were made without improvement in saturations.  Uncuffed trach was changed to a cuffed and the patient was placed on mechanical ventilation.  PCCM called for ICU admission.   SUBJECTIVE:  More alert.  Tolerating pressure support.  VITAL SIGNS: BP 135/61   Pulse (!) 102   Temp 98.8 F (37.1 C) (Oral)   Resp 17   Ht 5' 11"  (1.803 m)   Wt 148 lb 9.4 oz (67.4 kg)   SpO2 97%   BMI 20.72 kg/m   VENTILATOR SETTINGS: Vent Mode: CPAP;PSV FiO2 (%):  [40 %] 40 % Set Rate:  [14 bmp] 14 bmp Vt Set:  [600 mL] 600 mL PEEP:  [5 cmH20] 5 cmH20 Pressure Support:  [10 cmH20] 10 cmH20 Plateau Pressure:  [11 cmH20-19 cmH20] 11 cmH20  INTAKE / OUTPUT: I/O last 3 completed  shifts: In: 4965.2 [I.V.:1427.7; NG/GT:787.5; IV Piggyback:2750] Out: 1475 [Urine:1475]  PHYSICAL EXAMINATION:  General - more alert Eyes - pupils reactive ENT - trach site clean Cardiac - regular, no murmur Chest - better air movement Abd - soft, non tender, peg site clean Ext - 1+ edema Skin - stage 4 sacral wound Neuro - opens eyes and looks around the room   LABS:  BMET  Recent Labs Lab 12/23/16 0542 12/23/16 2140 12/24/16 0330  NA 139 138 138  K 4.2 3.9 4.0  CL 105 106 106  CO2 24 23 24   BUN 33* 32* 31*  CREATININE 1.20 1.17 1.15  GLUCOSE 102* 119* 103*    Electrolytes  Recent Labs Lab 12/23/16 0542 12/23/16 1346 12/23/16 1924 12/23/16 2140 12/24/16 0330  CALCIUM 10.2  --   --  10.1 10.0  MG 1.9 2.0 2.0  --  2.1  PHOS 3.5 3.7 3.2  --  3.3    CBC  Recent Labs Lab 12/22/16 1217 12/23/16 0542 12/24/16 0330  WBC 24.9* 21.9* 17.6*  HGB 10.0* 7.9* 7.3*  HCT 31.5* 25.8* 23.9*  PLT 841* 704* 685*    Coag's No results for input(s): APTT, INR in the last 168 hours.  Sepsis Markers  Recent Labs Lab 12/22/16 1227  LATICACIDVEN 1.31  ABG  Recent Labs Lab 12/23/16 0616  PHART 7.484*  PCO2ART 35.0  PO2ART 120.0*    Liver Enzymes  Recent Labs Lab 12/22/16 1217  AST 25  ALT 39  ALKPHOS 171*  BILITOT 0.7  ALBUMIN 2.0*    Cardiac Enzymes  Recent Labs Lab 12/23/16 2140 12/24/16 0330 12/24/16 0824  TROPONINI <0.03 <0.03 0.03*    Glucose  Recent Labs Lab 12/23/16 1113 12/23/16 1518 12/23/16 1926 12/23/16 2328 12/24/16 0335 12/24/16 0820  GLUCAP 119* 115* 116* 114* 106* 127*    Imaging No results found.   STUDIES:   CULTURES: BCx2 9/7 >>  Tracheal Aspirate 9/7 >>  Strep Antigen 9/7 >>   ANTIBIOTICS: Vanco 9/7 >>  Zosyn 9/7 >>   SIGNIFICANT EVENTS: 9/06  Discharge after admit with aspiration PNA, AKI 9/07  Re-admit with acute hypoxic respiratory failure   LINES/TUBES: Trach (cuffed changed) 9/7  >>  DISCUSSION: 66 y/o M with hx of severe cerebellar CVA / non-verbal status, SNF resident with trach / PEG readmitted 9/7 (after d/c 9/6) for worsening of hypoxic respiratory failure.  CXR concerning for increased L sided airspace disease and mucus plugging.    ASSESSMENT / PLAN:  Acute on chronic respiratory failure. Aspiration pneumonia with mucus plugging. Tracheostomy status. - pressure support to TC as able - f/u CXR - bronchial hygiene - continue Abx  Hx of HTN. - lopressor, clonidine, ASA  CKD 3. - monitor renal fx  Dysphagia. - tube feeds  Anemia of critical illness and chronic disease. - f/u CBC  Hx of cerebellar CVA with aphasia. - monitor neuro status  Stage 4 sacral wound. - present prior to admission  DVT prophylaxis - SQ heparin SUP - pepcid Nutrition - tube feeds Goals of care - full code  Updated pt's family at bedside.  Chesley Mires, MD [facility] Pulmonary/Critical Care 12/24/2016, 10:20 AM Pager:  [phone] After 3pm call: [phone]

## 2016-12-25 ENCOUNTER — Inpatient Hospital Stay (HOSPITAL_COMMUNITY): Payer: Medicare HMO

## 2016-12-25 ENCOUNTER — Encounter (HOSPITAL_COMMUNITY): Payer: Self-pay | Admitting: General Practice

## 2016-12-25 LAB — GLUCOSE, CAPILLARY
Glucose-Capillary: 99 mg/dL (ref 65–99)
Glucose-Capillary: 118 mg/dL — ABNORMAL HIGH (ref 65–99)
Glucose-Capillary: 133 mg/dL — ABNORMAL HIGH (ref 65–99)
Glucose-Capillary: 91 mg/dL (ref 65–99)
Glucose-Capillary: 103 mg/dL — ABNORMAL HIGH (ref 65–99)
Glucose-Capillary: 90 mg/dL (ref 65–99)

## 2016-12-25 LAB — BASIC METABOLIC PANEL
Anion gap: 6 (ref 5–15)
BUN: 26 mg/dL — ABNORMAL HIGH (ref 6–20)
CO2: 25 mmol/L (ref 22–32)
Calcium: 10.3 mg/dL (ref 8.9–10.3)
Chloride: 110 mmol/L (ref 101–111)
Creatinine, Ser: 1.01 mg/dL (ref 0.61–1.24)
GFR calc Af Amer: >60 mL/min (ref >60)
GFR calc non Af Amer: >60 mL/min (ref >60)
Glucose, Bld: 104 mg/dL — ABNORMAL HIGH (ref 65–99)
Potassium: 3.9 mmol/L (ref 3.5–5.1)
Sodium: 141 mmol/L (ref 135–145)

## 2016-12-25 LAB — CBC
HCT: 22.4 % — ABNORMAL LOW (ref 39.0–52.0)
Hemoglobin: 6.8 g/dL — CL (ref 13.0–17.0)
MCH: 27.9 pg (ref 26.0–34.0)
MCHC: 29.9 g/dL — ABNORMAL LOW (ref 30.0–36.0)
MCV: 93.3 fL (ref 78.0–100.0)
Platelets: 699 10*3/uL — ABNORMAL HIGH (ref 150–400)
RBC: 2.4 MIL/uL — ABNORMAL LOW (ref 4.22–5.81)
RDW: 16.9 % — ABNORMAL HIGH (ref 11.5–15.5)
WBC: 14.6 10*3/uL — ABNORMAL HIGH (ref 4.0–10.5)

## 2016-12-25 LAB — VANCOMYCIN, TROUGH: Vancomycin Tr: 26 ug/mL (ref 15–20)

## 2016-12-25 LAB — PREPARE RBC (CROSSMATCH)

## 2016-12-25 MED ORDER — SODIUM CHLORIDE 0.9 % IV SOLN: Freq: Once | INTRAVENOUS | Status: DC

## 2016-12-25 MED ORDER — FENTANYL CITRATE (PF) 100 MCG/2ML IJ SOLN
50.0000 ug | INTRAMUSCULAR | Status: DC | PRN
  Administered 2016-12-26: 50 ug via INTRAVENOUS
  Filled 2016-12-25: qty 2

## 2016-12-25 MED ORDER — VANCOMYCIN HCL 500 MG IV SOLR
500.0000 mg | Freq: Two times a day (BID) | INTRAVENOUS | Status: AC
  Administered 2016-12-26 – 2016-12-28 (×5): 500 mg via INTRAVENOUS
  Filled 2016-12-25 (×5): qty 500

## 2016-12-25 MED ORDER — FREE WATER
200.0000 mL | Freq: Three times a day (TID) | Status: DC
  Administered 2016-12-25 – 2017-01-01 (×21): 200 mL

## 2016-12-25 MED ORDER — FENTANYL CITRATE (PF) 100 MCG/2ML IJ SOLN: 50.0000 ug | INTRAMUSCULAR | Status: DC | PRN

## 2016-12-25 NOTE — Progress Notes (Signed)
CRITICAL VALUE ALERT  Critical Value:  HGB 6.8  Date & Time Notied:  9.10.18 0454  Provider Notified: [facility]/ Dr. Lamonte Sakai  Orders Received/Actions taken: Awaiting Orders

## 2016-12-25 NOTE — Progress Notes (Signed)
eLink Physician-Brief Progress Note Patient Name: MATTEW CHRISWELL DOB: 06-04-1950 MRN: [id number]   Date of Service  12/25/2016  HPI/Events of Note    eICU Interventions  1u PRBC given     Intervention Category Intermediate Interventions: Other:  Ayn Domangue S. 12/25/2016, 4:55 AM

## 2016-12-25 NOTE — Progress Notes (Signed)
CRITICAL VALUE ALERT  Critical Value:  Vanc Trough 26  Date & Time Notied:  9.10.18 2223  Provider Notified: Pharmacy  Orders Received/Actions taken: Awaiting instruction

## 2016-12-25 NOTE — Progress Notes (Signed)
Pharmacy Antibiotic Note  Scott Rivera is a 66 y.o. male admitted on 12/22/2016 with sepsis.  Pharmacy has been consulted for Vancomycin and Zosyn dosing. -vancomycin trough= 26 (~ 9pm; last dose given at ~ 1100am) -SCr= 1.01, CrCl ~ 70, UOP 0.9    Plan  -Change vancomycin to 500mg  IV q12h -No zosyn changes needed -Will follow renal function, cultures and clinical progress   Zosyn 3.375g IV q8h Follow c/s, clinical progression, renal function, vancomycin trough at SS  Height: 5\' 11"  (180.3 cm) Weight: 151 lb 14.4 oz (68.9 kg) IBW/kg (Calculated) : 75.3  Temp (24hrs), Avg:99.1 F (37.3 C), Min:98.4 F (36.9 C), Max:99.7 F (37.6 C)   Recent Labs Lab 12/21/16 0633 12/22/16 1217 12/22/16 1227 12/23/16 0542 12/23/16 2140 12/24/16 0330 12/25/16 0256 12/25/16 2114  WBC 17.2* 24.9*  --  21.9*  --  17.6* 14.6*  --   CREATININE 0.98 1.10  --  1.20 1.17 1.15 1.01  --   LATICACIDVEN  --   --  1.31  --   --   --   --   --   VANCOTROUGH  --   --   --   --   --   --   --  26*    Estimated Creatinine Clearance: 70.1 mL/min (by C-G formula based on SCr of 1.01 mg/dL).    No Known Allergies  Antimicrobials this admission: Vancomycin 9/7 >>  Zosyn 9/7 >>   Dose adjustments this admission: n/a  Microbiology results: 9/7 BCx: - ngtd    Thank you for allowing pharmacy to be a part of this patient's care.  Hildred Laser, Pharm D 12/25/2016 10:28 PM

## 2016-12-25 NOTE — Progress Notes (Signed)
200 ml of Fentanyl wasted in the sink witnessed by Damita Dunnings, RN.

## 2016-12-25 NOTE — Progress Notes (Signed)
PULMONARY / CRITICAL CARE MEDICINE   Name: Scott Rivera MRN: [id number] DOB: 1950/05/18    ADMISSION DATE:  12/22/2016 CONSULTATION DATE:  12/22/16  REFERRING MD:  Dr. Roderic Palau / EDP   CHIEF COMPLAINT:  Low O2 saturations   HISTORY OF PRESENT ILLNESS:   66 y/o M who presented with acute hypoxic respiratory failure.  Baseline nonverbal, trach / PEG and lives in a SNF since July 2018 after CVA.   Recent admit from 8/25-9/6 for sepsis in the setting of aspiration PNA, AKI. He was discharged 9/6 to a SNF for care.  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      The patient returns 9/7 with reports of low oxygen saturations (60's) at the SNF.  He was reportedly found with increased secretions, thick yellow sputum and tachycardia.  On arrival to the ER, the patient vomited on the staff.  Initial labs - Na 137, K 4.4, Cl 100, CO2 26, glucose 128, BUN 33, Sr Cr 1.10, alk phos 171, albumin 2.0, AST 25, ALT 39, T. Bilirubin 0.7, lactic acid 1.31, WBC 24.9, Hgb 10 and platelets 841.  CXR showed LLL mucus plugging with cut off sign and airspace disease.  Attempts at suctioning the patient were made without improvement in saturations.  Uncuffed trach was changed to a cuffed and the patient was placed on mechanical ventilation.  PCCM called for ICU admission.   SUBJECTIVE:  Got blood last night, otherwise no acute issues  Weaning on PSV of 10; appears comfortable   VITAL SIGNS: BP (!) 149/71 (BP Location: Right Arm)   Pulse (!) 107   Temp 99.7 F (37.6 C) (Oral)   Resp (!) 32   Ht _0  (1.803 m)   Wt 151 lb 14.4 oz (68.9 kg)   SpO2 98%   BMI 21.19 kg/m   VENTILATOR SETTINGS: Vent Mode: CPAP;PSV FiO2 (%):  [40 %] 40 % Set Rate:  [14 bmp] 14 bmp Vt Set:  [600 mL] 600 mL PEEP:  [5 cmH20] 5 cmH20 Pressure Support:  [5 cmH20-10 cmH20] 10  cmH20 Plateau Pressure:  [13 cmH20-21 cmH20] 13 cmH20  INTAKE / OUTPUT:  Intake/Output Summary (Last 24 hours) at 12/25/16 0930 Last data filed at 12/25/16 0800  Gross per 24 hour  Intake             2725 ml  Output             1650 ml  Net             1075 ml     PHYSICAL EXAMINATION:  General appearance:  Chronically ill appearing 66 Year old  Male NAD, currently in acute distress,opens eyes but does not f/c Eyes: anicteric sclerae, moist conjunctivae; PERRL, EOMI bilaterally. Mouth:  membranes and no mucosal ulcerations; Neck: Trachea midline; neck supple, no JVD, has #6 cuffed trach.  Lungs/chest: scattered rhonchi, with normal respiratory effort and no intercostal retractions CV: RRR, no MRGs  Abdomen: Soft, non-tender; no masses or HSM, PEG unremarkable  Extremities: dependent  peripheral edema all extremities in braces Skin: Normal temperature, turgor and texture; sacral wound not assessed Psych: awakens, tracks but does not f/c  LABS:  BMET  Recent Labs Lab 12/23/16 2140 12/24/16 0330 12/25/16 0256  NA 138 138 141  K 3.9 4.0 3.9  CL 106 106 110  CO2 23 24  25  BUN 32* 31* 26*  CREATININE 1.17 1.15 1.01  GLUCOSE 119* 103* 104*    Electrolytes  Recent Labs Lab 12/23/16 1924 12/23/16 2140 12/24/16 0330 12/24/16 1604 12/25/16 0256  CALCIUM  --  10.1 10.0  --  10.3  MG 2.0  --  2.1 2.0  --   PHOS 3.2  --  3.3 3.3  --     CBC  Recent Labs Lab 12/23/16 0542 12/24/16 0330 12/25/16 0256  WBC 21.9* 17.6* 14.6*  HGB 7.9* 7.3* 6.8*  HCT 25.8* 23.9* 22.4*  PLT 704* 685* 699*    Coag's No results for input(s): APTT, INR in the last 168 hours.  Sepsis Markers  Recent Labs Lab 12/22/16 1227  LATICACIDVEN 1.31    ABG  Recent Labs Lab 12/23/16 0616  PHART 7.484*  PCO2ART 35.0  PO2ART 120.0*    Liver Enzymes  Recent Labs Lab 12/22/16 1217  AST 25  ALT 39  ALKPHOS 171*  BILITOT 0.7  ALBUMIN 2.0*    Cardiac Enzymes  Recent  Labs Lab 12/23/16 2140 12/24/16 0330 12/24/16 0824  TROPONINI <0.03 <0.03 0.03*    Glucose  Recent Labs Lab 12/24/16 0820 12/24/16 1217 12/24/16 1554 12/24/16 1924 12/24/16 2318 12/25/16 0323  GLUCAP 127* 155* 116* 96 112* 99    Imaging Dg Chest Port 1 View  Result Date: 12/25/2016 CLINICAL DATA:  Respiratory failure EXAM: PORTABLE CHEST 1 VIEW COMPARISON:  Two days prior FINDINGS: History of pneumonia with left more than right lung opacity at the bases. There is some improvement in left base aeration, with better visualized diaphragm. Grossly stable heart size and mediastinal contours when accounting for rotation. No suspected edema, effusion, or pneumothorax. Tracheostomy tube in place. IMPRESSION: History of pneumonia with left more than right lung opacity. Improved aeration at the left base with better visualized diaphragm. Electronically Signed   By: Monte Fantasia M.D.   On: 12/25/2016 07:10     STUDIES:   CULTURES: BCx2 9/7 >>  Tracheal Aspirate 9/7 >>     ANTIBIOTICS: Vanco 9/7 >>  Zosyn 9/7 >>   SIGNIFICANT EVENTS: 9/06  Discharge after admit with aspiration PNA, AKI 9/07  Re-admit with acute hypoxic respiratory failure   LINES/TUBES: Trach (cuffed changed) 9/7 >>  DISCUSSION: 66 y/o M with hx of severe cerebellar CVA / non-verbal status, SNF resident with trach / PEG readmitted 9/7 (after d/c 9/6) for worsening of hypoxic respiratory failure.  CXR concerning for increased L sided airspace disease and mucus plugging.    ASSESSMENT / PLAN:  Acute on chronic respiratory failure. Aspiration pneumonia with mucus plugging. Tracheostomy status. pcxr personally reviewed: some improved aeration of left. A little more atx right base.  Plan Cont PSV-->working towards ATC Await culture data; cont nosocomial coverage; day # 4 vanc and zosyn Add hypertonic saline neb & cont BDs   Hx of HTN. Plan Cont lopressor, clonidine and asa   CKD 3.  At risk for  fluid and electrolyte imbalance  -no change Plan Trend chem Avoid volume excess   Dysphagia. Plan Cont tube feeds  Anemia of critical illness and chronic disease. hgb got < 7; triggered xfusion protocol  Plan Trend cbc Transfused X 1 unit - f/u CBC  Hx of cerebellar CVA with aphasia. Plan Supportive care   Stage 4 sacral wound. - present prior to admission Plan Cont wound care   DVT prophylaxis - SQ heparin SUP - pepcid Nutrition - tube feeds Goals of care - full  code  Updated pt's family at bedside.    My cct 74 min  Erick Colace ACNP-BC [place] Pager # [phone] OR # [phone] if no answer

## 2016-12-25 NOTE — Progress Notes (Addendum)
Have tried to contact family multiple times, unable to get anyone on the phone for Informed Blood Consent at this time.  Patient is Non-verbal and trach-vent, cannot consent.  [facility] called and Dr. Lamonte Sakai aware.

## 2016-12-26 LAB — TYPE AND SCREEN
ABO/RH(D): O POS
Antibody Screen: NEGATIVE
Unit division: 0

## 2016-12-26 LAB — CBC
HCT: 25.5 % — ABNORMAL LOW (ref 39.0–52.0)
Hemoglobin: 7.8 g/dL — ABNORMAL LOW (ref 13.0–17.0)
MCH: 27.8 pg (ref 26.0–34.0)
MCHC: 30.6 g/dL (ref 30.0–36.0)
MCV: 90.7 fL (ref 78.0–100.0)
Platelets: 700 10*3/uL — ABNORMAL HIGH (ref 150–400)
RBC: 2.81 MIL/uL — ABNORMAL LOW (ref 4.22–5.81)
RDW: 17.8 % — ABNORMAL HIGH (ref 11.5–15.5)
WBC: 14 10*3/uL — ABNORMAL HIGH (ref 4.0–10.5)

## 2016-12-26 LAB — GLUCOSE, CAPILLARY
Glucose-Capillary: 105 mg/dL — ABNORMAL HIGH (ref 65–99)
Glucose-Capillary: 108 mg/dL — ABNORMAL HIGH (ref 65–99)
Glucose-Capillary: 117 mg/dL — ABNORMAL HIGH (ref 65–99)
Glucose-Capillary: 93 mg/dL (ref 65–99)
Glucose-Capillary: 95 mg/dL (ref 65–99)
Glucose-Capillary: 97 mg/dL (ref 65–99)

## 2016-12-26 LAB — BPAM RBC
Blood Product Expiration Date: 201809152359
ISSUE DATE / TIME: 201809101159
Unit Type and Rh: 5100

## 2016-12-26 MED ORDER — SODIUM CHLORIDE 3 % IN NEBU
4.0000 mL | INHALATION_SOLUTION | Freq: Every day | RESPIRATORY_TRACT | Status: AC
  Administered 2016-12-26 – 2016-12-28 (×3): 4 mL via RESPIRATORY_TRACT
  Filled 2016-12-26 (×3): qty 4

## 2016-12-26 NOTE — Progress Notes (Signed)
CSW attempted to reach out to pt's sister [name]) as notes report that she is not wanting pt to go back to [facility] at the time of discharge. CSW will continue to assists with needs as presented.   [name], MSW, [place] [facility] Clinical Social Worker [phone]

## 2016-12-26 NOTE — Progress Notes (Signed)
This note also relates to the following rows which could not be included: Pulse Rate - Cannot attach notes to unvalidated device data Resp - Cannot attach notes to unvalidated device data BP - Cannot attach notes to unvalidated device data SpO2 - Cannot attach notes to unvalidated device data  Patient placed on trach collar at 10L and 40% O2. Sats are 99% ad vitals are stable. RT will contiue to monitor.

## 2016-12-26 NOTE — Progress Notes (Signed)
PULMONARY / CRITICAL CARE MEDICINE   Name: Scott Rivera MRN: [id number] DOB: Apr 27, 1950    ADMISSION DATE:  12/22/2016 CONSULTATION DATE:  12/22/16  REFERRING MD:  Dr. Roderic Palau / EDP   CHIEF COMPLAINT:  Low O2 saturations   HISTORY OF PRESENT ILLNESS:   66 y/o M who presented with acute hypoxic respiratory failure.  Baseline nonverbal, trach / PEG and lives in a SNF since July 2018 after CVA.   Recent admit from 8/25-9/6 for sepsis in the setting of aspiration PNA, AKI. He was discharged 9/6 to a SNF for care.  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      The patient returns 9/7 with reports of low oxygen saturations (60's) at the SNF.  He was reportedly found with increased secretions, thick yellow sputum and tachycardia.  On arrival to the ER, the patient vomited on the staff.  Initial labs - Na 137, K 4.4, Cl 100, CO2 26, glucose 128, BUN 33, Sr Cr 1.10, alk phos 171, albumin 2.0, AST 25, ALT 39, T. Bilirubin 0.7, lactic acid 1.31, WBC 24.9, Hgb 10 and platelets 841.  CXR showed LLL mucus plugging with cut off sign and airspace disease.  Attempts at suctioning the patient were made without improvement in saturations.  Uncuffed trach was changed to a cuffed and the patient was placed on mechanical ventilation.  PCCM called for ICU admission.   SUBJECTIVE:  Looks better More awake Weaning on PSV   VITAL SIGNS: BP 139/71 (BP Location: Right Arm)   Pulse 96   Temp 98.7 F (37.1 C) (Oral)   Resp (!) 26   Ht 5' 11"  (1.803 m)   Wt 149 lb 0.5 oz (67.6 kg)   SpO2 98%   BMI 20.79 kg/m   VENTILATOR SETTINGS: Vent Mode: PSV;CPAP FiO2 (%):  [40 %] 40 % Set Rate:  [14 bmp] 14 bmp Vt Set:  [600 mL] 600 mL PEEP:  [5 cmH20] 5 cmH20 Pressure Support:  [10 cmH20] 10 cmH20 Plateau Pressure:  [17 cmH20-20 cmH20] 20 cmH20  INTAKE /  OUTPUT:  Intake/Output Summary (Last 24 hours) at 12/26/16 4076 Last data filed at 12/26/16 0800  Gross per 24 hour  Intake           2122.5 ml  Output             2000 ml  Net            122.5 ml     PHYSICAL EXAMINATION:  General appearance:  66 Year old  Chronically ill appearing Male cachectic  NAD, actually more interactive today  Eyes: anicteric sclerae, moist conjunctivae; PERRL, EOMI bilaterally. Mouth: moist membranes and no mucosal ulcerations;  Neck: Trachea midline; neck supple, no JVD, # 6 cuffed trach is unremarkable  Lungs/chest: decreased bases, with normal respiratory effort and no intercostal retractions CV: RRR, no MRGs  Abdomen: Soft, non-tender; no masses or HSM PEG unremarkable  Extremities: No sig peripheral edema BUE ane BLE are in braces Skin: Normal temperature, turgor and texture; Psych:awake, tracks, nods "yes", contracted.    LABS:  BMET  Recent Labs Lab 12/23/16 2140 12/24/16 0330 12/25/16 0256  NA 138 138 141  K 3.9 4.0 3.9  CL 106 106 110  CO2 23 24 25   BUN 32* 31* 26*  CREATININE 1.17 1.15 1.01  GLUCOSE 119* 103* 104*  Electrolytes  Recent Labs Lab 12/23/16 1924 12/23/16 2140 12/24/16 0330 12/24/16 1604 12/25/16 0256  CALCIUM  --  10.1 10.0  --  10.3  MG 2.0  --  2.1 2.0  --   PHOS 3.2  --  3.3 3.3  --     CBC  Recent Labs Lab 12/24/16 0330 12/25/16 0256 12/26/16 0513  WBC 17.6* 14.6* 14.0*  HGB 7.3* 6.8* 7.8*  HCT 23.9* 22.4* 25.5*  PLT 685* 699* 700*    Coag's No results for input(s): APTT, INR in the last 168 hours.  Sepsis Markers  Recent Labs Lab 12/22/16 1227  LATICACIDVEN 1.31    ABG  Recent Labs Lab 12/23/16 0616  PHART 7.484*  PCO2ART 35.0  PO2ART 120.0*    Liver Enzymes  Recent Labs Lab 12/22/16 1217  AST 25  ALT 39  ALKPHOS 171*  BILITOT 0.7  ALBUMIN 2.0*    Cardiac Enzymes  Recent Labs Lab 12/23/16 2140 12/24/16 0330 12/24/16 0824  TROPONINI <0.03 <0.03 0.03*     Glucose  Recent Labs Lab 12/25/16 1218 12/25/16 1622 12/25/16 1927 12/25/16 2322 12/26/16 0302 12/26/16 0715  GLUCAP 133* 91 103* 90 105* 97    Imaging No results found.   STUDIES:   CULTURES: BCx2 9/7 >>  Tracheal Aspirate 9/7 >> (not sent)    ANTIBIOTICS: Vanco 9/7 >>  Zosyn 9/7 >>   SIGNIFICANT EVENTS: 9/06  Discharge after admit with aspiration PNA, AKI 9/07  Re-admit with acute hypoxic respiratory failure   LINES/TUBES: Trach (cuffed changed) 9/7 >>  DISCUSSION: 66 y/o M with hx of severe cerebellar CVA / non-verbal status, SNF resident with trach / PEG readmitted 9/7 (after d/c 9/6) for worsening of hypoxic respiratory failure.  CXR concerning for increased L sided airspace disease and mucus plugging.   -he looks better -will start ATC trials today -ready for vent/SDU -complete 7d nosocomial coverage   ASSESSMENT / PLAN:  Acute on chronic respiratory failure. Aspiration pneumonia with mucus plugging. Tracheostomy status. pcxr personally reviewed: some improved aeration of left. A little more atx right base.  Plan Try atc as tolerated Will complete 7d total vanc/zosyn (now day 5)Await culture data; cont nosocomial coverage; day # 4 vanc cont nebs and add hypertonic saline neb daily    Hx of HTN. Plan Cont lopressor, clonidine and asa  CKD 3.  At risk for fluid and electrolyte imbalance  -no change Plan Trend cmp intermittently  Cont I&O  Dysphagia. Plan Cont tubefeeds  Anemia of critical illness and chronic disease. hgb got < 7; triggered xfusion protocol  Plan Trend cbc Capron heparin  Hx of cerebellar CVA with aphasia. Plan Cont supportive care  Has splints   Stage 4 sacral wound. - present prior to admission Plan Cont wound care   DVT prophylaxis - SQ heparin SUP - pepcid Nutrition - tube feeds Goals of care - full code  Updated pt's family at bedside.    My cct 21 min  Erick Colace ACNP-BC [place] Pager # [phone] OR # [phone] if no answer

## 2016-12-27 DIAGNOSIS — E440 Moderate protein-calorie malnutrition: Secondary | ICD-10-CM

## 2016-12-27 DIAGNOSIS — N189 Chronic kidney disease, unspecified: Secondary | ICD-10-CM

## 2016-12-27 DIAGNOSIS — D631 Anemia in chronic kidney disease: Secondary | ICD-10-CM

## 2016-12-27 LAB — GLUCOSE, CAPILLARY
Glucose-Capillary: 107 mg/dL — ABNORMAL HIGH (ref 65–99)
Glucose-Capillary: 110 mg/dL — ABNORMAL HIGH (ref 65–99)
Glucose-Capillary: 117 mg/dL — ABNORMAL HIGH (ref 65–99)
Glucose-Capillary: 91 mg/dL (ref 65–99)
Glucose-Capillary: 97 mg/dL (ref 65–99)
Glucose-Capillary: 99 mg/dL (ref 65–99)

## 2016-12-27 LAB — CULTURE, BLOOD (ROUTINE X 2)
Culture: NO GROWTH
Culture: NO GROWTH
Special Requests: ADEQUATE
Special Requests: ADEQUATE

## 2016-12-27 MED ORDER — AMLODIPINE BESYLATE 5 MG PO TABS
5.0000 mg | ORAL_TABLET | Freq: Every day | ORAL | Status: DC
  Administered 2016-12-27 – 2016-12-29 (×3): 5 mg
  Filled 2016-12-27 (×2): qty 1

## 2016-12-27 MED ORDER — FENTANYL CITRATE (PF) 100 MCG/2ML IJ SOLN: 25.0000 ug | INTRAMUSCULAR | Status: DC | PRN

## 2016-12-27 MED ORDER — ACETAMINOPHEN 325 MG PO TABS
650.0000 mg | ORAL_TABLET | Freq: Four times a day (QID) | ORAL | Status: DC | PRN
  Administered 2016-12-31: 650 mg
  Filled 2016-12-27: qty 2

## 2016-12-27 MED ORDER — GABAPENTIN 250 MG/5ML PO SOLN
200.0000 mg | Freq: Every day | ORAL | Status: DC
  Administered 2016-12-27 – 2017-01-09 (×13): 200 mg
  Filled 2016-12-27 (×15): qty 4

## 2016-12-27 MED ORDER — CLONIDINE HCL 0.1 MG PO TABS
0.1000 mg | ORAL_TABLET | Freq: Three times a day (TID) | ORAL | Status: DC
  Administered 2016-12-27 – 2017-01-10 (×42): 0.1 mg
  Filled 2016-12-27 (×43): qty 1

## 2016-12-27 NOTE — Progress Notes (Signed)
CSW reached out out to [facility] and was informed that they are unable to meet pt needs at this time. CSW contacted Freda Munro at [facility] to see if they would be able to meet need. At this time CSW is waiting to hear from Freda Munro as a VM was left for her to call CSW back.    Virgie Dad Junette Bernat, MSW, [place] [facility] Clinical Social Worker [phone]

## 2016-12-27 NOTE — Clinical Social Work Note (Signed)
Clinical Social Work Assessment  Patient Details  Name: RAYDEL HOSICK MRN: [id number] Date of Birth: September 21, 1950  Date of referral:  12/27/16               Reason for consult:  Facility Placement                Permission sought to share information with:  Family Supports Permission granted to share information::  Yes, Verbal Permission Granted  Name::     [name]   Agency::     Relationship::     Contact Information:     Housing/Transportation Living arrangements for the past 2 months:  [place] [employer]) Source of Information:  Other (Comment Required) (sister Journalist, newspaper ) Patient Interpreter Needed:  None Criminal Activity/Legal Involvement Pertinent to Current Situation/Hospitalization:  No - Comment as needed Significant Relationships:  Siblings Lives with:  Facility Resident Do you feel safe going back to the place where you live?  No Need for family participation in patient care:  Yes (Comment)  Care giving concerns: CSW met with pt and pt's sister [name] at bedside. During this time pt was asleep therefore CSW spoke with sister. Sister is concerned with the care that pt has been receiving while at [facility] and is not wanting pt to return to the facility at the time of discharge.    Social Worker assessment / plan:  CSW spoke with pt's sister at bedside. During this time CSW was informed that pt is from [facility]. At the facility pt's sister feels that pt is not getting the care that is needed. [name] informed CSW that she would like for pt to be placed in another facility. CSW explained to her that there are only three facilities that are abel to accomodates the needs of pt. CSW explained all three including one being [facility], [place]. Pt's sister asked that CSW follow up with [facility] to see if they can meet pt needs and per admissions at [facility] they are unable to meet pt needs due to pt's  trach just being placed back in June. CSW informed sister that she would reach out to staff at [facility] to see if they can met pt needs.   Employment status:  Retired Nurse, adult PT Recommendations:  Not assessed at this time Information / Referral to community resources:  [employer]  Patient/Family's Response to care:  Pt's sister is understanding and agreeable to plan of care at this time.   Patient/Family's Understanding of and Emotional Response to Diagnosis, Current Treatment, and Prognosis:  No further questions or concerns have been presented to CSW at this time.   Emotional Assessment Appearance:  Appears stated age Attitude/Demeanor/Rapport:  Unable to Assess (pt was resting. ) Affect (typically observed):  Unable to Assess (pt was resting.) Orientation:   (pt was resting. ) Alcohol / Substance use:  Not Applicable Psych involvement (Current and /or in the community):  No (Comment)  Discharge Needs  Concerns to be addressed:  Care Coordination Readmission within the last 30 days:  Yes Current discharge risk:  None Barriers to Discharge:  No Barriers Identified   Wetzel Bjornstad, [place] 12/27/2016, 3:00 PM

## 2016-12-27 NOTE — Progress Notes (Signed)
[place] TEAM 1 - Stepdown/ICU TEAM  Scott Rivera  [name] DOB: September 22, 1950 DOA: 12/22/2016 PCP: Patient, No Pcp Per    Brief Narrative:  66 y/o M who presented with acute hypoxic respiratory failure after he was found with increased secretions and thick yellow sputum.  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   He was admitted 8/25 > 9/6 for sepsis in the setting of aspiration PNA w/ AKI.       In the ED a CXR showed LLL mucus plugging with cut off sign and airspace disease.  Attempts at suctioning the patient were made without improvement in saturations.  Uncuffed trach was changed to a cuffed and the patient was placed on mechanical ventilation.  Significant Events: 9/06  Discharge after admit with aspiration PNA, AKI 9/07  Re-admit with acute hypoxic respiratory failure  9/12 [facility] assumed care   Subjective: Pt is alert but does not effectively communicate.  He does not respond to simple questions or follow simple commands.  No evidence of acute resp distress w/ pt presently on trach collar.    Assessment & Plan:  Acute on chronic hypoxic respiratory failure due to Aspiration pneumonia with mucus plugging - chronic tracheostomy complete 7d total vanc/zosyn - vent weaning and trach care per PCCM  HTN BP trending upward - adjust medical tx and follow  CKD Stage 3 Renal function is presently stable   Recent Labs Lab 12/22/16 1217 12/23/16 0542 12/23/16 2140 12/24/16 0330 12/25/16 0256  CREATININE 1.10 1.20 1.17 1.15 1.01    Dysphagia - Chronic PEG tube - Severe protein malnutrition Cont tube feeds - no complications presently   Anemia of critical illness and chronic disease Has been transfused 1U PRBC this admit thus far - no evidence of blood loss - suspect anemia due to recurring critical illness, poor nutritional status, and CKD  Recent Labs Lab 12/22/16 1217 12/23/16 0542  12/24/16 0330 12/25/16 0256 12/26/16 0513  HGB 10.0* 7.9* 7.3* 6.8* 7.8*    Hx of cerebellar CVA with aphasia This has been a devastating event w/ no measurable recovery noted apart from pt regaining consciousness  Stage 4 sacral wound present prior to admission - cont wound care   DVT prophylaxis: SQ heparin  Code Status: FULL CODE Family Communication: no family present at time of exam  Disposition Plan: SDU - will need to return to a SNF at d/c (reportedly his POA desires a different facility)  Consultants:  PCCM  Antimicrobials:  Vanco 9/7 >  Zosyn 9/7 >   Objective: Blood pressure (!) 153/78, pulse 98, temperature 98.9 F (37.2 C), temperature source Oral, resp. rate (!) 29, height 5' 11"  (1.803 m), weight 68.8 kg (151 lb 10.8 oz), SpO2 98 %.  Intake/Output Summary (Last 24 hours) at 12/27/16 0847 Last data filed at 12/27/16 0800  Gross per 24 hour  Intake             1765 ml  Output             1950 ml  Net             -185 ml   Filed Weights   12/25/16 0423 12/26/16 0342 12/27/16 0500  Weight: 68.9 kg (151 lb 14.4 oz) 67.6 kg (149 lb 0.5 oz) 68.8 kg (151 lb 10.8 oz)    Examination: General: No acute respiratory distress evident on trach  collar  Lungs: course breath sounds th/o all fields - no wheezing - bibasilar crackles Cardiovascular: Regular rate and rhythm without murmur  Abdomen: Nontender, nondistended, soft, bowel sounds positive, no rebound, no ascites, no appreciable mass Extremities: No significant edema bilateral lower extremities - heel sparing boots in place B LE   CBC:  Recent Labs Lab 12/22/16 1217 12/23/16 0542 12/24/16 0330 12/25/16 0256 12/26/16 0513  WBC 24.9* 21.9* 17.6* 14.6* 14.0*  NEUTROABS 20.2*  --   --   --   --   HGB 10.0* 7.9* 7.3* 6.8* 7.8*  HCT 31.5* 25.8* 23.9* 22.4* 25.5*  MCV 90.8 91.2 93.0 93.3 90.7  PLT 841* 704* 685* 699* 546*   Basic Metabolic Panel:  Recent Labs Lab 12/22/16 1217 12/23/16 0542  12/23/16 1346 12/23/16 1924 12/23/16 2140 12/24/16 0330 12/24/16 1604 12/25/16 0256  NA 137 139  --   --  138 138  --  141  K 4.4 4.2  --   --  3.9 4.0  --  3.9  CL 100* 105  --   --  106 106  --  110  CO2 26 24  --   --  23 24  --  25  GLUCOSE 128* 102*  --   --  119* 103*  --  104*  BUN 33* 33*  --   --  32* 31*  --  26*  CREATININE 1.10 1.20  --   --  1.17 1.15  --  1.01  CALCIUM 11.1* 10.2  --   --  10.1 10.0  --  10.3  MG  --  1.9 2.0 2.0  --  2.1 2.0  --   PHOS  --  3.5 3.7 3.2  --  3.3 3.3  --    GFR: Estimated Creatinine Clearance: 70 mL/min (by C-G formula based on SCr of 1.01 mg/dL).  Liver Function Tests:  Recent Labs Lab 12/22/16 1217  AST 25  ALT 39  ALKPHOS 171*  BILITOT 0.7  PROT 8.6*  ALBUMIN 2.0*    Cardiac Enzymes:  Recent Labs Lab 12/23/16 2140 12/24/16 0330 12/24/16 0824  TROPONINI <0.03 <0.03 0.03*    HbA1C: Hgb A1c MFr Bld  Date/Time Value Ref Range Status  12/09/2016 02:19 PM 5.7 (H) 4.8 - 5.6 % Final    Comment:    (NOTE) Pre diabetes:          5.7%-6.4% Diabetes:              >6.4% Glycemic control for   <7.0% adults with diabetes   10/21/2016 02:53 AM 5.4 4.8 - 5.6 % Final    Comment:    (NOTE)         Pre-diabetes: 5.7 - 6.4         Diabetes: >6.4         Glycemic control for adults with diabetes: <7.0     CBG:  Recent Labs Lab 12/26/16 1556 12/26/16 1951 12/26/16 2338 12/27/16 0337 12/27/16 0740  GLUCAP 108* 93 95 91 99    Recent Results (from the past 240 hour(s))  Blood Culture (routine x 2)     Status: None (Preliminary result)   Collection Time: 12/22/16 12:17 PM  Result Value Ref Range Status   Specimen Description BLOOD RIGHT HAND  Final   Special Requests IN PEDIATRIC BOTTLE Blood Culture adequate volume  Final   Culture NO GROWTH 4 DAYS  Final   Report Status PENDING  Incomplete  Blood Culture (routine  x 2)     Status: None (Preliminary result)   Collection Time: 12/22/16 12:30 PM  Result Value  Ref Range Status   Specimen Description BLOOD RIGHT HAND  Final   Special Requests IN PEDIATRIC BOTTLE Blood Culture adequate volume  Final   Culture NO GROWTH 4 DAYS  Final   Report Status PENDING  Incomplete     Scheduled Meds: . amLODipine  2.5 mg Per Tube Daily  . aspirin  325 mg Per Tube Daily  . chlorhexidine gluconate (MEDLINE KIT)  15 mL Mouth Rinse BID  . cloNIDine  0.1 mg Per Tube BID  . clopidogrel  75 mg Per Tube Daily  . famotidine  20 mg Per Tube Q12H  . feeding supplement (PRO-STAT SUGAR FREE 64)  30 mL Per Tube TID  . free water  200 mL Per Tube Q8H  . gabapentin  200 mg Oral QHS  . heparin  5,000 Units Subcutaneous Q8H  . ipratropium-albuterol  3 mL Nebulization Q6H  . mouth rinse  15 mL Mouth Rinse QID  . metoprolol tartrate  10 mg Per Tube BID  . multivitamin with minerals  1 tablet Per Tube Daily  . pravastatin  20 mg Per Tube q1800  . sodium chloride HYPERTONIC  4 mL Nebulization Daily  . thiamine  100 mg Per Tube Daily     LOS: 5 days   Cherene Altes, MD [facility] Office  [phone] Pager - Text Page per Amion as per below:  On-Call/Text Page:      [email or web address]      password [email or web address]  If 7PM-7AM, please contact night-coverage [email or web address] Password [email or web address] 12/27/2016, 8:47 AM

## 2016-12-28 DIAGNOSIS — I10 Essential (primary) hypertension: Secondary | ICD-10-CM

## 2016-12-28 DIAGNOSIS — D72829 Elevated white blood cell count, unspecified: Secondary | ICD-10-CM

## 2016-12-28 DIAGNOSIS — Z515 Encounter for palliative care: Secondary | ICD-10-CM

## 2016-12-28 DIAGNOSIS — J690 Pneumonitis due to inhalation of food and vomit: Principal | ICD-10-CM

## 2016-12-28 DIAGNOSIS — Z7189 Other specified counseling: Secondary | ICD-10-CM

## 2016-12-28 DIAGNOSIS — E43 Unspecified severe protein-calorie malnutrition: Secondary | ICD-10-CM

## 2016-12-28 DIAGNOSIS — N183 Chronic kidney disease, stage 3 (moderate): Secondary | ICD-10-CM

## 2016-12-28 DIAGNOSIS — J9690 Respiratory failure, unspecified, unspecified whether with hypoxia or hypercapnia: Secondary | ICD-10-CM

## 2016-12-28 DIAGNOSIS — D638 Anemia in other chronic diseases classified elsewhere: Secondary | ICD-10-CM

## 2016-12-28 DIAGNOSIS — L89154 Pressure ulcer of sacral region, stage 4: Secondary | ICD-10-CM

## 2016-12-28 LAB — RETICULOCYTES
RBC.: 3.54 MIL/uL — ABNORMAL LOW (ref 4.22–5.81)
Retic Count, Absolute: 74.3 10*3/uL (ref 19.0–186.0)
Retic Ct Pct: 2.1 % (ref 0.4–3.1)

## 2016-12-28 LAB — COMPREHENSIVE METABOLIC PANEL
ALT: 20 U/L (ref 17–63)
AST: 16 U/L (ref 15–41)
Albumin: 1.7 g/dL — ABNORMAL LOW (ref 3.5–5.0)
Alkaline Phosphatase: 117 U/L (ref 38–126)
Anion gap: 7 (ref 5–15)
BUN: 25 mg/dL — ABNORMAL HIGH (ref 6–20)
CO2: 26 mmol/L (ref 22–32)
Calcium: 10 mg/dL (ref 8.9–10.3)
Chloride: 101 mmol/L (ref 101–111)
Creatinine, Ser: 0.99 mg/dL (ref 0.61–1.24)
GFR calc Af Amer: >60 mL/min (ref >60)
GFR calc non Af Amer: >60 mL/min (ref >60)
Glucose, Bld: 92 mg/dL (ref 65–99)
Potassium: 4.1 mmol/L (ref 3.5–5.1)
Sodium: 134 mmol/L — ABNORMAL LOW (ref 135–145)
Total Bilirubin: 0.4 mg/dL (ref 0.3–1.2)
Total Protein: 7.1 g/dL (ref 6.5–8.1)

## 2016-12-28 LAB — GLUCOSE, CAPILLARY
Glucose-Capillary: 86 mg/dL (ref 65–99)
Glucose-Capillary: 104 mg/dL — ABNORMAL HIGH (ref 65–99)
Glucose-Capillary: 145 mg/dL — ABNORMAL HIGH (ref 65–99)
Glucose-Capillary: 134 mg/dL — ABNORMAL HIGH (ref 65–99)
Glucose-Capillary: 93 mg/dL (ref 65–99)

## 2016-12-28 LAB — CBC
HCT: 29.4 % — ABNORMAL LOW (ref 39.0–52.0)
Hemoglobin: 9.2 g/dL — ABNORMAL LOW (ref 13.0–17.0)
MCH: 28.3 pg (ref 26.0–34.0)
MCHC: 31.3 g/dL (ref 30.0–36.0)
MCV: 90.5 fL (ref 78.0–100.0)
Platelets: 765 10*3/uL — ABNORMAL HIGH (ref 150–400)
RBC: 3.25 MIL/uL — ABNORMAL LOW (ref 4.22–5.81)
RDW: 16.5 % — ABNORMAL HIGH (ref 11.5–15.5)
WBC: 16 10*3/uL — ABNORMAL HIGH (ref 4.0–10.5)

## 2016-12-28 NOTE — Care Management Important Message (Signed)
Important Message  Patient Details  Name: Scott Rivera MRN: [id number] Date of Birth: [birth year]   Medicare Important Message Given:  Yes    Nathen May 12/28/2016, 9:19 AM
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
## 2016-12-28 NOTE — Care Management Note (Addendum)
kCase Management Note  Patient Details  Name: Scott Rivera MRN: [id number] Date of Birth: Feb 24, 1951  Subjective/Objective:  Presents with  hx of severe cerebellar CVA / non-verbal status, SNF resident with trach / PEG readmitted 9/7 (after d/c 9/6) for worsening of hypoxic respiratory failure.  CXR concerning for increased L sided airspace disease and mucus plugging . NCM spoke with patient sister, [name], she states she was thinking about taking patient home with her at dc.  She states she can do just as good a job as the skilled facility.  She asked if I could help with PCS services, NCM gave her the application for PCS services and explained to her that this will have to be filled out by the patient's PCP.  She states he goes to an Urgent Care,  NCM informed her that not sure if Urgent Care can fill the app out. NCM informed her to check with [facility] and [facility] services as well for assistance, NCM gave her the phone number for [facility] and PCS. [name] home phone is [phone] and her cell is [phone]. CSW working on SNF.  [name] is hoping she could get pcs services for some hours but there is a waiting list for these services.  9/18 [place], BSN - NCM spoke with patient's sister, asked if she thought she would be able to handle patient at home,  NCM informed her that a [facility] will not come out everyday to do trach care, they will show her how to do this and she will be responsible for taking care of that, he is also on tube feeds.  She continued to state she wants to take patient home, she chose [facility] for [facility],   [place] spoke with [facility] for [facility] services, they faxed the form to NCM to be filled out by MD and faxed back 48 hrs prior to discharge so that it can be expedited  - fax number is [phone].  NCM informed RN to let sister get involved in his care so she will know what is expected of her, when RN tried to get her involved in turning patient, she did  not assist RN, she soon left.  CSW conts to follow along in case sister changes her mind about taking him home.    9/20 [place], BSN - NCM spoke with patient sister again, with CSW and informed her that think SNF is safer for patient, if she takes him home she will be doing the trach care, wound care and tube feeding her self, the [facility] will show her what to do but she will be responsible for doing this care, she then states she will try the SNF one more time, CSW will facilitate SNF placement.                  Action/Plan: NCM will follow along with CSW for dc needs.  Expected Discharge Date:                  Expected Discharge Plan:  Skilled Nursing Facility  In-House Referral:  Clinical Social Work  Discharge planning Services  CM Consult  Post Acute Care Choice:    Choice offered to:     DME Arranged:    DME Agency:     HH Arranged:    [place] Agency:     Status of Service:     If discussed at [employer]  Length of Stay Meetings, dates discussed:    Additional Comments:  Zenon Mayo, RN 12/28/2016, 2:56 PM

## 2016-12-28 NOTE — Progress Notes (Signed)
PROGRESS NOTE    Scott Rivera  MLY:[id number] DOB: 08/06/1950 DOA: 12/22/2016 PCP: Patient, No Pcp Per   Brief Narrative:  66 y/o BM PMHx severe Cerebellar CVA (10/2016), HTN, CKD stage III, anemia due to CKD  Presented with acute hypoxic respiratory failure.  Baseline nonverbal, Lurline Idol / PEG and lives in a SNF since July 2018 after CVA.    Recent admit from 8/25-9/6 for sepsis in the setting of aspiration PNA, AKI. He was discharged 9/6 to a SNF for care.  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       The patient returns 9/7 with reports of low oxygen saturations (60's) at the SNF.  He was reportedly found with increased secretions, thick yellow sputum and tachycardia.  On arrival to the ER, the patient vomited on the staff.  Initial labs - Na 137, K 4.4, Cl 100, CO2 26, glucose 128, BUN 33, Sr Cr 1.10, alk phos 171, albumin 2.0, AST 25, ALT 39, T. Bilirubin 0.7, lactic acid 1.31, WBC 24.9, Hgb 10 and platelets 841.  CXR showed LLL mucus plugging with cut off sign and airspace disease.  Attempts at suctioning the patient were made without improvement in saturations.  Uncuffed trach was changed to a cuffed and the patient was placed on mechanical ventilation.   .     Subjective: 9/13 opens eyes to his name being called, mild withdrawal to painful stimuli. Follows no commands no meaningful movement.     Assessment & Plan:   Active Problems:   PNA (pneumonia)   Acute on chronic respiratory failure with hypoxic/Aspiration pneumonia with mucus plugging/tracheostomy dependent -Completed 7 days antibiotics -Patient on trach collar -Ensure patient's Baseline O2 demands. But appears stable   -PCXR pending 9/14  Leukocytosis -Most likely reactive. Negative fever, left shift, bands. -Patient has completed 7 days of antibiotics. Will  discontinue. If patient spikes fever off antibiotics will panculture.   Essential HTN -Controlled -Amlodipine 5 mg daily -Clonidine 0.1 mg  TID -Metoprolol 10 mg BID  CKD Stage 3 (baseline 1-1.5)   Recent Labs Lab 12/22/16 1217 12/23/16 0542 12/23/16 2140 12/24/16 0330 12/25/16 0256 12/28/16 0406  CREATININE 1.10 1.20 1.17 1.15 1.01 0.99  -baseline   Dysphagia/Chronic PEG tube  -Continue tube feeds No competition    Severe protein calorie malnutrition     Anemia of critical illness and chronic disease -Has been transfused 1U PRBC this admit thus far  -Negative evidence of occult bleeding -Suspect anemia secondary to recurring critical illness/malnutrition/CKD -Anemia panel pending -Occult blood pending    Hx of cerebellar CVA with aphasia -SEVERE CVA. No meaningful improvement in patient status expected. -See goals care    Stage 4 sacral wound -Present prior to admission: Per wound care     Goals of care -9/13 PALLIATIVE CARE: Patient with severe CVA requiring PEG/trach patient now baseline noncommunicative eval for DO NOT RESUSCITATE short-termvs long-term goals care/hospice   DVT prophylaxis: Subcutaneous heparin Code Status: Full Family Communication: None Disposition Plan: Return SNF: Await recommendations from palliative care   Consultants:  PC CM     Procedures/Significant Events:  9/06  Discharge after admit with aspiration PNA, AKI 9/07  Re-admit with acute hypoxic respiratory failure  9/12 [facility] assumed care     I have personally reviewed and interpreted all radiology studies and my findings are as above.  VENTILATOR  SETTINGS: TC FiO2: 35%   Cultures 9/7 blood negative    Antimicrobials: Anti-infectives    Start     Stop   12/26/16 0100  vancomycin (VANCOCIN) 500 mg in sodium chloride 0.9 % 100 mL IVPB     12/28/16 0344   12/22/16 2200  vancomycin (VANCOCIN) IVPB 750 mg/150 ml premix  Status:  Discontinued     12/25/16 2236    12/22/16 1800  piperacillin-tazobactam (ZOSYN) IVPB 3.375 g     12/29/16 2159   12/22/16 1200  vancomycin (VANCOCIN) IVPB 1000 mg/200 mL premix     12/22/16 1430   12/22/16 1200  piperacillin-tazobactam (ZOSYN) IVPB 3.375 g     12/22/16 1330       Devices    LINES / TUBES:  Tracheostomy #6 cuffed 9/7>>    Continuous Infusions: . feeding supplement (JEVITY 1.2 CAL) 1,000 mL (12/27/16 2000)  . piperacillin-tazobactam (ZOSYN)  IV 3.375 g (12/28/16 0615)     Objective: Vitals:   12/28/16 0400 12/28/16 0442 12/28/16 0721 12/28/16 0737  BP: (!) 142/67  115/61 115/61  Pulse: 82 90 93 90  Resp: (!) 21 (!) 31 (!) 31 (!) 27  Temp:    98.9 F (37.2 C)  TempSrc:    Oral  SpO2: 99% 98% 97% 98%  Weight:      Height:        Intake/Output Summary (Last 24 hours) at 12/28/16 0853 Last data filed at 12/28/16 0300  Gross per 24 hour  Intake             1215 ml  Output             1975 ml  Net             -760 ml   Filed Weights   12/25/16 0423 12/26/16 0342 12/27/16 0500  Weight: 151 lb 14.4 oz (68.9 kg) 149 lb 0.5 oz (67.6 kg) 151 lb 10.8 oz (68.8 kg)    Examination:  General: Opens eyes when you call his name, positive acute respiratory distress Eyes: negative scleral hemorrhage, negative anisocoria, negative icterus Neck:  Negative scars, masses, torticollis, lymphadenopathy, JVD, #6 cuffed trach in place negative sign of infection currently on trach collar. Lungs: Clear to auscultation bilaterally, except mild basilar crackles Cardiovascular: Regular rate and rhythm without murmur gallop or rub normal S1 and S2 Abdomen: negative abdominal pain, nondistended, positive soft, bowel sounds, no rebound, no ascites, no appreciable mass, PEG tube in place negative sign of infection Extremities: No significant cyanosis, clubbing, or edema bilateral lower extremities Skin: Negative rashes, lesions, ulcers Psychiatric:  Unable to evaluate secondary to CVA  Central nervous system:   Patient opens eyes when you call his name, does not follow commands, mild withdrawal to painful stimuli  .     Data Reviewed: Care during the described time interval was provided by me .  I have reviewed this patient's available data, including medical history, events of note, physical examination, and all test results as part of my evaluation.   CBC:  Recent Labs Lab 12/22/16 1217 12/23/16 0542 12/24/16 0330 12/25/16 0256 12/26/16 0513 12/28/16 0406  WBC 24.9* 21.9* 17.6* 14.6* 14.0* 16.0*  NEUTROABS 20.2*  --   --   --   --   --   HGB 10.0* 7.9* 7.3* 6.8* 7.8* 9.2*  HCT 31.5* 25.8* 23.9* 22.4* 25.5* 29.4*  MCV 90.8 91.2 93.0 93.3 90.7 90.5  PLT 841* 704* 685* 699* 700* 765*   Basic  Metabolic Panel:  Recent Labs Lab 12/23/16 0542 12/23/16 1346 12/23/16 1924 12/23/16 2140 12/24/16 0330 12/24/16 1604 12/25/16 0256 12/28/16 0406  NA 139  --   --  138 138  --  141 134*  K 4.2  --   --  3.9 4.0  --  3.9 4.1  CL 105  --   --  106 106  --  110 101  CO2 24  --   --  23 24  --  25 26  GLUCOSE 102*  --   --  119* 103*  --  104* 92  BUN 33*  --   --  32* 31*  --  26* 25*  CREATININE 1.20  --   --  1.17 1.15  --  1.01 0.99  CALCIUM 10.2  --   --  10.1 10.0  --  10.3 10.0  MG 1.9 2.0 2.0  --  2.1 2.0  --   --   PHOS 3.5 3.7 3.2  --  3.3 3.3  --   --    GFR: Estimated Creatinine Clearance: 71.4 mL/min (by C-G formula based on SCr of 0.99 mg/dL). Liver Function Tests:  Recent Labs Lab 12/22/16 1217 12/28/16 0406  AST 25 16  ALT 39 20  ALKPHOS 171* 117  BILITOT 0.7 0.4  PROT 8.6* 7.1  ALBUMIN 2.0* 1.7*   No results for input(s): LIPASE, AMYLASE in the last 168 hours. No results for input(s): AMMONIA in the last 168 hours. Coagulation Profile: No results for input(s): INR, PROTIME in the last 168 hours. Cardiac Enzymes:  Recent Labs Lab 12/23/16 2140 12/24/16 0330 12/24/16 0824  TROPONINI <0.03 <0.03 0.03*   BNP (last 3 results) No results for input(s):  PROBNP in the last 8760 hours. HbA1C: No results for input(s): HGBA1C in the last 72 hours. CBG:  Recent Labs Lab 12/27/16 1551 12/27/16 2032 12/27/16 2337 12/28/16 0326 12/28/16 0736  GLUCAP 97 110* 107* 86 104*   Lipid Profile: No results for input(s): CHOL, HDL, LDLCALC, TRIG, CHOLHDL, LDLDIRECT in the last 72 hours. Thyroid Function Tests: No results for input(s): TSH, T4TOTAL, FREET4, T3FREE, THYROIDAB in the last 72 hours. Anemia Panel: No results for input(s): VITAMINB12, FOLATE, FERRITIN, TIBC, IRON, RETICCTPCT in the last 72 hours. Urine analysis:    Component Value Date/Time   COLORURINE AMBER (A) 12/16/2016 1625   APPEARANCEUR CLEAR 12/16/2016 1625   LABSPEC 1.018 12/16/2016 1625   PHURINE 5.0 12/16/2016 1625   GLUCOSEU NEGATIVE 12/16/2016 1625   HGBUR NEGATIVE 12/16/2016 1625   BILIRUBINUR NEGATIVE 12/16/2016 1625   KETONESUR NEGATIVE 12/16/2016 1625   PROTEINUR 30 (A) 12/16/2016 1625   NITRITE NEGATIVE 12/16/2016 1625   LEUKOCYTESUR NEGATIVE 12/16/2016 1625   Sepsis Labs: _0 (procalcitonin:4,lacticidven:4)  ) Recent Results (from the past 240 hour(s))  Blood Culture (routine x 2)     Status: None   Collection Time: 12/22/16 12:17 PM  Result Value Ref Range Status   Specimen Description BLOOD RIGHT HAND  Final   Special Requests IN PEDIATRIC BOTTLE Blood Culture adequate volume  Final   Culture NO GROWTH 5 DAYS  Final   Report Status 12/27/2016 FINAL  Final  Blood Culture (routine x 2)     Status: None   Collection Time: 12/22/16 12:30 PM  Result Value Ref Range Status   Specimen Description BLOOD RIGHT HAND  Final   Special Requests IN PEDIATRIC BOTTLE Blood Culture adequate volume  Final   Culture NO GROWTH 5 DAYS  Final  Report Status 12/27/2016 FINAL  Final         Radiology Studies: No results found.      Scheduled Meds: . amLODipine  5 mg Per Tube Daily  . aspirin  325 mg Per Tube Daily  . chlorhexidine gluconate (MEDLINE  KIT)  15 mL Mouth Rinse BID  . cloNIDine  0.1 mg Per Tube TID  . clopidogrel  75 mg Per Tube Daily  . famotidine  20 mg Per Tube Q12H  . feeding supplement (PRO-STAT SUGAR FREE 64)  30 mL Per Tube TID  . free water  200 mL Per Tube Q8H  . gabapentin  200 mg Per Tube QHS  . heparin  5,000 Units Subcutaneous Q8H  . ipratropium-albuterol  3 mL Nebulization Q6H  . mouth rinse  15 mL Mouth Rinse QID  . metoprolol tartrate  10 mg Per Tube BID  . multivitamin with minerals  1 tablet Per Tube Daily  . pravastatin  20 mg Per Tube q1800  . thiamine  100 mg Per Tube Daily   Continuous Infusions: . feeding supplement (JEVITY 1.2 CAL) 1,000 mL (12/27/16 2000)  . piperacillin-tazobactam (ZOSYN)  IV 3.375 g (12/28/16 0615)     LOS: 6 days    Time spent: 37 minutes    Anatalia Kronk, Geraldo Docker, MD [facility] Pager [phone]   If 7PM-7AM, please contact night-coverage [email or web address] Password [facility] 12/28/2016, 8:53 AM

## 2016-12-29 ENCOUNTER — Inpatient Hospital Stay (HOSPITAL_COMMUNITY): Payer: Medicare HMO

## 2016-12-29 DIAGNOSIS — J9600 Acute respiratory failure, unspecified whether with hypoxia or hypercapnia: Secondary | ICD-10-CM

## 2016-12-29 DIAGNOSIS — I639 Cerebral infarction, unspecified: Secondary | ICD-10-CM

## 2016-12-29 LAB — COMPREHENSIVE METABOLIC PANEL
ALT: 18 U/L (ref 17–63)
AST: 14 U/L — ABNORMAL LOW (ref 15–41)
Albumin: 1.8 g/dL — ABNORMAL LOW (ref 3.5–5.0)
Alkaline Phosphatase: 108 U/L (ref 38–126)
Anion gap: 7 (ref 5–15)
BUN: 25 mg/dL — ABNORMAL HIGH (ref 6–20)
CO2: 26 mmol/L (ref 22–32)
Calcium: 10 mg/dL (ref 8.9–10.3)
Chloride: 101 mmol/L (ref 101–111)
Creatinine, Ser: 1.07 mg/dL (ref 0.61–1.24)
GFR calc Af Amer: >60 mL/min (ref >60)
GFR calc non Af Amer: >60 mL/min (ref >60)
Glucose, Bld: 114 mg/dL — ABNORMAL HIGH (ref 65–99)
Potassium: 3.9 mmol/L (ref 3.5–5.1)
Sodium: 134 mmol/L — ABNORMAL LOW (ref 135–145)
Total Bilirubin: 0.2 mg/dL — ABNORMAL LOW (ref 0.3–1.2)
Total Protein: 7.4 g/dL (ref 6.5–8.1)

## 2016-12-29 LAB — FOLATE: Folate: 25 ng/mL (ref >5.9)

## 2016-12-29 LAB — GLUCOSE, CAPILLARY
Glucose-Capillary: 103 mg/dL — ABNORMAL HIGH (ref 65–99)
Glucose-Capillary: 106 mg/dL — ABNORMAL HIGH (ref 65–99)
Glucose-Capillary: 114 mg/dL — ABNORMAL HIGH (ref 65–99)
Glucose-Capillary: 118 mg/dL — ABNORMAL HIGH (ref 65–99)
Glucose-Capillary: 122 mg/dL — ABNORMAL HIGH (ref 65–99)
Glucose-Capillary: 124 mg/dL — ABNORMAL HIGH (ref 65–99)
Glucose-Capillary: 97 mg/dL (ref 65–99)

## 2016-12-29 LAB — LACTIC ACID, PLASMA: Lactic Acid, Venous: 1.3 mmol/L (ref 0.5–1.9)

## 2016-12-29 LAB — IRON AND TIBC
Iron: 24 ug/dL — ABNORMAL LOW (ref 45–182)
Saturation Ratios: 13 % — ABNORMAL LOW (ref 17.9–39.5)
TIBC: 179 ug/dL — ABNORMAL LOW (ref 250–450)
UIBC: 155 ug/dL

## 2016-12-29 LAB — VITAMIN B12: Vitamin B-12: 472 pg/mL (ref 180–914)

## 2016-12-29 LAB — FERRITIN: Ferritin: 1039 ng/mL — ABNORMAL HIGH (ref 24–336)

## 2016-12-29 LAB — MAGNESIUM: Magnesium: 1.8 mg/dL (ref 1.7–2.4)

## 2016-12-29 MED ORDER — AMLODIPINE BESYLATE 10 MG PO TABS
10.0000 mg | ORAL_TABLET | Freq: Every day | ORAL | Status: DC
  Administered 2016-12-30 – 2017-01-10 (×11): 10 mg
  Filled 2016-12-29 (×12): qty 1

## 2016-12-29 NOTE — Progress Notes (Addendum)
Nutrition Follow Up  DOCUMENTATION CODES:   Severe malnutrition in context of chronic illness  INTERVENTION:    Continue Jevity 1.2 formula at 50 ml/hr with Prostat TID  TF regimen providing 1740 kcals, 112 gm protein, 972 ml of free water  NUTRITION DIAGNOSIS:   Malnutrition (Severe) related to chronic illness as evidenced by percent weight loss, severe depletion of muscle mass, severe depletion of body fat, ongoing  GOAL:   Patient will meet greater than or equal to 90% of their needs, met  MONITOR:   TF tolerance, Skin, Labs, Weight trends, Vent status  ASSESSMENT:    66 yo male admitted with acute respiratory failure with LLL mucous plugging, possible aspiration with N/V on arrival to ER. 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 Pt with additional hx of CKD III, HTN   Pt currently on trach collar. Jevity 1.2 formula infusing at goal rate of 50 ml/hr with Prostat 30 ml TID via PEG tube. CCM note reviewed. Pt now with large L loculated effusion. Free water flushes at 200 ml every 8 hours.  Medications reviewed and include MVI and thiamine. Labs reviewed. Na 134 (L). Iron 24 (L). CBG's [id number].  Diet Order:  Diet NPO time specified  Skin:  Wound (see comment) (unstageable sacrum)  Last BM:  9/13  Height:   Ht Readings from Last 1 Encounters:  12/23/16 _0  (1.803 m)    Weight:   Wt Readings from Last 1 Encounters:  12/29/16 160 lb 7.9 oz (72.8 kg)    BMI:  Body mass index is 22.38 kg/m.  Estimated Nutritional Needs:   Kcal:  1600-1800  Protein:  100-115 gm  Fluid:  >/= 2 L  EDUCATION NEEDS:   No education needs identified at this time   Arthur Holms, RD, LDN Pager #: [phone] After-Hours Pager #: [phone]

## 2016-12-29 NOTE — Progress Notes (Signed)
PULMONARY / CRITICAL CARE MEDICINE   Name: Scott Rivera MRN: [id number] DOB: 03-02-1951    ADMISSION DATE:  12/22/2016 CONSULTATION DATE:  12/22/16  REFERRING MD:  Dr. Roderic Palau / EDP   CHIEF COMPLAINT:  Low O2 saturations   HISTORY OF PRESENT ILLNESS:   66 y/o M who presented with acute hypoxic respiratory failure.  Baseline nonverbal, trach / PEG and lives in a SNF since July 2018 after CVA.   Recent admit from 8/25-9/6 for sepsis in the setting of aspiration PNA, AKI. He was discharged 9/6 to a SNF for care.  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      The patient returns 9/7 with reports of low oxygen saturations (60's) at the SNF.  He was reportedly found with increased secretions, thick yellow sputum and tachycardia.  On arrival to the ER, the patient vomited on the staff.  Initial labs - Na 137, K 4.4, Cl 100, CO2 26, glucose 128, BUN 33, Sr Cr 1.10, alk phos 171, albumin 2.0, AST 25, ALT 39, T. Bilirubin 0.7, lactic acid 1.31, WBC 24.9, Hgb 10 and platelets 841.  CXR showed LLL mucus plugging with cut off sign and airspace disease.  Attempts at suctioning the patient were made without improvement in saturations.  Uncuffed trach was changed to a cuffed and the patient was placed on mechanical ventilation.  PCCM called for ICU admission.   SUBJECTIVE:  No distress.   VITAL SIGNS: BP (!) 166/79   Pulse (!) 113   Temp 99.2 F (37.3 C) (Axillary)   Resp (!) 37   Ht _0  (1.803 m)   Wt 160 lb 7.9 oz (72.8 kg)   SpO2 96%   BMI 22.38 kg/m   VENTILATOR SETTINGS: FiO2 (%):  [35 %] 35 %  INTAKE / OUTPUT:  Intake/Output Summary (Last 24 hours) at 12/29/16 1208 Last data filed at 12/29/16 0800  Gross per 24 hour  Intake              700 ml  Output               75 ml  Net              625 ml     PHYSICAL EXAMINATION:  General  appearance:  66 Year old  Male/cachectic  NAD, non-verbal Eyes: anicteric sclerae, moist conjunctivae; PERRL, EOMI bilaterally. Mouth:  membranes and no mucosal ulcerations; normal hard and soft palate Neck: Trachea midline; neck supple, no JVD, trach unremarkable  Lungs/chest: scattered rhonchi, with normal respiratory effort and no intercostal retractions CV: RRR, no MRGs  Abdomen: Soft, non-tender; no masses or HSM Extremities: No peripheral edema or extremity lymphadenopathy, in splints  Skin: Normal temperature, turgor and texture; no rash, ulcers or subcutaneous nodules Psych: awake, tracks does not f/c  LABS:  BMET  Recent Labs Lab 12/25/16 0256 12/28/16 0406 12/29/16 0331  NA 141 134* 134*  K 3.9 4.1 3.9  CL 110 101 101  CO2 _1 BUN 26* 25* 25*  CREATININE 1.01 0.99 1.07  GLUCOSE 104* 92 114*    Electrolytes  Recent Labs Lab 12/23/16 1924  12/24/16 0330 12/24/16 1604 12/25/16 0256 12/28/16 0406 12/29/16 0331  CALCIUM  --   < > 10.0  --  10.3 10.0 10.0  MG 2.0  --  2.1 2.0  --   --  1.8  PHOS 3.2  --  3.3 3.3  --   --   --   < > = values in this interval not displayed.  CBC  Recent Labs Lab 12/25/16 0256 12/26/16 0513 12/28/16 0406  WBC 14.6* 14.0* 16.0*  HGB 6.8* 7.8* 9.2*  HCT 22.4* 25.5* 29.4*  PLT 699* 700* 765*    Coag's No results for input(s): APTT, INR in the last 168 hours.  Sepsis Markers  Recent Labs Lab 12/22/16 1227 12/29/16 0331  LATICACIDVEN 1.31 1.3    ABG  Recent Labs Lab 12/23/16 0616  PHART 7.484*  PCO2ART 35.0  PO2ART 120.0*    Liver Enzymes  Recent Labs Lab 12/22/16 1217 12/28/16 0406 12/29/16 0331  AST 25 16 14*  ALT 39 20 18  ALKPHOS 171* 117 108  BILITOT 0.7 0.4 0.2*  ALBUMIN 2.0* 1.7* 1.8*    Cardiac Enzymes  Recent Labs Lab 12/23/16 2140 12/24/16 0330 12/24/16 0824  TROPONINI <0.03 <0.03 0.03*    Glucose  Recent Labs Lab 12/28/16 1147 12/28/16 1546 12/28/16 1947  12/29/16 0018 12/29/16 0434 12/29/16 0847  GLUCAP 145* 134* 93 114* 97 122*    Imaging Dg Chest Port 1 View  Result Date: 12/29/2016 CLINICAL DATA:  Pneumonia, history hypertension, aspiration, stage III chronic kidney disease, stroke EXAM: PORTABLE CHEST 1 VIEW COMPARISON:  Portable exam 0627 hours compared to 12/25/2016 FINDINGS: Tracheostomy tube stable. Stable heart size and mediastinal contours. Moderate to large LEFT pleural effusion significantly increased since previous ago. Coexistent atelectasis and consolidation of LEFT lung. Persistent infiltrate at RIGHT base. No definite RIGHT pleural effusion or evidence of pneumothorax. 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 IMPRESSION: Significantly increased LEFT pleural effusion with increased atelectasis and consolidation of LEFT lung. Persistent consolidation at RIGHT lung base. Electronically Signed   By: Ulyses Southward M.D.   On: 12/29/2016 07:23     STUDIES:   CULTURES: BCx2 9/7 >>  Tracheal Aspirate 9/7 >> (not sent)    ANTIBIOTICS: Vanco 9/7 >>  Zosyn 9/7 >>   SIGNIFICANT EVENTS: 9/06  Discharge after admit with aspiration PNA, AKI 9/07  Re-admit with acute hypoxic respiratory failure   LINES/TUBES: Trach (cuffed changed) 9/7 >>  DISCUSSION: 66 y/o M with hx of severe cerebellar CVA / non-verbal status, SNF resident with trach / PEG readmitted 9/7 (after d/c 9/6) for worsening of hypoxic respiratory failure.  CXR concerning for increased L sided airspace disease and mucus plugging.   Off vent but now has large left loculated effusion Will CT chest->depending on results will decide on bedside CT vs IR chest placement  ASSESSMENT / PLAN:  Acute on chronic respiratory failure. Aspiration pneumonia with mucus plugging (NOS) Large left loculated pleural effusion  Tracheostomy status. pcxr personally reviewed: has mod to large left loculated effusion. The right side actually looks a  little better.  Plan Cont atc as tolerated CT chest-->better evaluate left effusion Will likely need chest tube placement and TPA/DNase as he is NOT a candidate for VATS Cont current abx-->would extend to 10d course.    Hx of HTN. Plan Cont lopressor, clonidine and asa  CKD 3.  At risk for fluid and electrolyte imbalance  -no change Plan Trend cmp  Dysphagia. Plan Cont tubefeeds  Anemia of critical illness and chronic disease. hgb got < 7; triggered xfusion protocol  Plan [place] heparin   Hx of cerebellar CVA with aphasia. Plan Supportive care  Stage 4 sacral wound. - present prior  to admission Plan Cont wound care DVT prophylaxis - SQ heparin SUP - pepcid Nutrition - tube feeds Goals of care - full code  Updated pt's family at bedside.     Erick Colace ACNP-BC [place] Pager # [phone] OR # [phone] if no answer

## 2016-12-29 NOTE — Progress Notes (Signed)
[place] TEAM 1 - Stepdown/ICU TEAM  CALEN GEISTER  XAJ:[id number] DOB: April 10, 1951 DOA: 12/22/2016 PCP: Patient, No Pcp Per    Brief Narrative:  66 y/o M who presented with acute hypoxic respiratory failure after he was found with increased secretions and thick yellow sputum.  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   He was admitted 8/25 > 9/6 for sepsis in the setting of aspiration PNA w/ AKI.       In the ED a CXR showed LLL mucus plugging with cut off sign and airspace disease.  Attempts at suctioning the patient were made without improvement in saturations.  Uncuffed trach was changed to a cuffed and the patient was placed on mechanical ventilation.  Significant Events: 9/06  Discharge after admit with aspiration PNA, AKI 9/07  Re-admit with acute hypoxic respiratory failure due to mucus plugging  9/12 [facility] assumed care   Subjective: The pt is less responsive today.  He does not follow simple commands for me today, and makes no attempt to mouth responses to my questions.    Assessment & Plan:  Acute on chronic hypoxic respiratory failure due to Aspiration pneumonia with mucus plugging - chronic tracheostomy completed 7d total vanc/zosyn - trach care per PCCM - appears to be stabilizing on trach collar alone presently - wean O2 as able   HTN BP variable - adjust tx again today and monitor   CKD Stage 3 Renal function is presently stable   Recent Labs Lab 12/23/16 2140 12/24/16 0330 12/25/16 0256 12/28/16 0406 12/29/16 0331  CREATININE 1.17 1.15 1.01 0.99 1.07    Dysphagia - Chronic PEG tube - Severe protein malnutrition Cont tube feeds via PEG   Anemia of critical illness and chronic disease Has been transfused 1U PRBC this admit thus far - no evidence of blood loss - suspect anemia due to recurring critical illness, poor nutritional status, and CKD  Recent Labs Lab 12/23/16 0542  12/24/16 0330 12/25/16 0256 12/26/16 0513 12/28/16 0406  HGB 7.9* 7.3* 6.8* 7.8* 9.2*    Hx of cerebellar CVA with aphasia This has been a devastating event w/ no measurable recovery noted apart from pt regaining consciousness - Palliative Care has met with his sister again, who re-affirms her unwavering desire to continue full aggressive medical care   Stage 4 sacral wound present prior to admission - cont wound care   DVT prophylaxis: SQ heparin  Code Status: FULL CODE Family Communication: spoke w/ sister at bedside Disposition Plan: SDU - will need to return to a SNF at d/c - should be ready for d/c in 48-72hrs Consultants:  PCCM  Antimicrobials:  Vanco 9/7 > 9/13 Zosyn 9/7 > 9/13  Objective: Blood pressure (!) 151/70, pulse 95, temperature 99.2 F (37.3 C), temperature source Axillary, resp. rate (!) 27, height 5' 11"  (1.803 m), weight 72.8 kg (160 lb 7.9 oz), SpO2 98 %.  Intake/Output Summary (Last 24 hours) at 12/29/16 1043 Last data filed at 12/29/16 0600  Gross per 24 hour  Intake              600 ml  Output                0 ml  Net              600 ml   Filed Weights   12/27/16 0500 12/28/16 1900 12/29/16 0500  Weight: 68.8 kg (151 lb 10.8 oz) 69 kg (152 lb 1.9 oz) 72.8 kg (160 lb 7.9 oz)    Examination: General: No acute respiratory distress - on trach collar  Lungs: course breath sounds th/o all fields w/o wheezing  Cardiovascular: RRR Abdomen: Nontender, nondistended, soft, bowel sounds positive, PEG insertion clean and dry  Extremities: No edema B LE - heel sparing boots in place B LE   CBC:  Recent Labs Lab 12/22/16 1217 12/23/16 0542 12/24/16 0330 12/25/16 0256 12/26/16 0513 12/28/16 0406  WBC 24.9* 21.9* 17.6* 14.6* 14.0* 16.0*  NEUTROABS 20.2*  --   --   --   --   --   HGB 10.0* 7.9* 7.3* 6.8* 7.8* 9.2*  HCT 31.5* 25.8* 23.9* 22.4* 25.5* 29.4*  MCV 90.8 91.2 93.0 93.3 90.7 90.5  PLT 841* 704* 685* 699* 700* 144*   Basic Metabolic  Panel:  Recent Labs Lab 12/23/16 0542 12/23/16 1346 12/23/16 1924 12/23/16 2140 12/24/16 0330 12/24/16 1604 12/25/16 0256 12/28/16 0406 12/29/16 0331  NA 139  --   --  138 138  --  141 134* 134*  K 4.2  --   --  3.9 4.0  --  3.9 4.1 3.9  CL 105  --   --  106 106  --  110 101 101  CO2 24  --   --  23 24  --  25 26 26   GLUCOSE 102*  --   --  119* 103*  --  104* 92 114*  BUN 33*  --   --  32* 31*  --  26* 25* 25*  CREATININE 1.20  --   --  1.17 1.15  --  1.01 0.99 1.07  CALCIUM 10.2  --   --  10.1 10.0  --  10.3 10.0 10.0  MG 1.9 2.0 2.0  --  2.1 2.0  --   --  1.8  PHOS 3.5 3.7 3.2  --  3.3 3.3  --   --   --    GFR: Estimated Creatinine Clearance: 69.9 mL/min (by C-G formula based on SCr of 1.07 mg/dL).  Liver Function Tests:  Recent Labs Lab 12/22/16 1217 12/28/16 0406 12/29/16 0331  AST 25 16 14*  ALT 39 20 18  ALKPHOS 171* 117 108  BILITOT 0.7 0.4 0.2*  PROT 8.6* 7.1 7.4  ALBUMIN 2.0* 1.7* 1.8*    Cardiac Enzymes:  Recent Labs Lab 12/23/16 2140 12/24/16 0330 12/24/16 0824  TROPONINI <0.03 <0.03 0.03*    HbA1C: Hgb A1c MFr Bld  Date/Time Value Ref Range Status  12/09/2016 02:19 PM 5.7 (H) 4.8 - 5.6 % Final    Comment:    (NOTE) Pre diabetes:          5.7%-6.4% Diabetes:              >6.4% Glycemic control for   <7.0% adults with diabetes   10/21/2016 02:53 AM 5.4 4.8 - 5.6 % Final    Comment:    (NOTE)         Pre-diabetes: 5.7 - 6.4         Diabetes: >6.4         Glycemic control for adults with diabetes: <7.0     CBG:  Recent Labs Lab 12/28/16 1546 12/28/16 1947 12/29/16 0018 12/29/16 0434 12/29/16 0847  GLUCAP 134* 93 114* 97 122*    Recent Results (from the past 240 hour(s))  Blood Culture (routine x 2)  Status: None   Collection Time: 12/22/16 12:17 PM  Result Value Ref Range Status   Specimen Description BLOOD RIGHT HAND  Final   Special Requests IN PEDIATRIC BOTTLE Blood Culture adequate volume  Final   Culture NO  GROWTH 5 DAYS  Final   Report Status 12/27/2016 FINAL  Final  Blood Culture (routine x 2)     Status: None   Collection Time: 12/22/16 12:30 PM  Result Value Ref Range Status   Specimen Description BLOOD RIGHT HAND  Final   Special Requests IN PEDIATRIC BOTTLE Blood Culture adequate volume  Final   Culture NO GROWTH 5 DAYS  Final   Report Status 12/27/2016 FINAL  Final     Scheduled Meds: . amLODipine  5 mg Per Tube Daily  . aspirin  325 mg Per Tube Daily  . chlorhexidine gluconate (MEDLINE KIT)  15 mL Mouth Rinse BID  . cloNIDine  0.1 mg Per Tube TID  . clopidogrel  75 mg Per Tube Daily  . famotidine  20 mg Per Tube Q12H  . feeding supplement (PRO-STAT SUGAR FREE 64)  30 mL Per Tube TID  . free water  200 mL Per Tube Q8H  . gabapentin  200 mg Per Tube QHS  . heparin  5,000 Units Subcutaneous Q8H  . ipratropium-albuterol  3 mL Nebulization Q6H  . mouth rinse  15 mL Mouth Rinse QID  . metoprolol tartrate  10 mg Per Tube BID  . multivitamin with minerals  1 tablet Per Tube Daily  . pravastatin  20 mg Per Tube q1800  . thiamine  100 mg Per Tube Daily     LOS: 7 days   Cherene Altes, MD [facility] Office  [phone] Pager - Text Page per Amion as per below:  On-Call/Text Page:      [email or web address]      password [email or web address]  If 7PM-7AM, please contact night-coverage [email or web address] Password [facility] 12/29/2016, 10:43 AM

## 2016-12-30 DIAGNOSIS — J9601 Acute respiratory failure with hypoxia: Secondary | ICD-10-CM

## 2016-12-30 LAB — CBC
HCT: 32.1 % — ABNORMAL LOW (ref 39.0–52.0)
Hemoglobin: 9.8 g/dL — ABNORMAL LOW (ref 13.0–17.0)
MCH: 27.9 pg (ref 26.0–34.0)
MCHC: 30.5 g/dL (ref 30.0–36.0)
MCV: 91.5 fL (ref 78.0–100.0)
Platelets: 870 10*3/uL — ABNORMAL HIGH (ref 150–400)
RBC: 3.51 MIL/uL — ABNORMAL LOW (ref 4.22–5.81)
RDW: 16.9 % — ABNORMAL HIGH (ref 11.5–15.5)
WBC: 21.2 10*3/uL — ABNORMAL HIGH (ref 4.0–10.5)

## 2016-12-30 LAB — BASIC METABOLIC PANEL
Anion gap: 7 (ref 5–15)
BUN: 23 mg/dL — ABNORMAL HIGH (ref 6–20)
CO2: 25 mmol/L (ref 22–32)
Calcium: 10.4 mg/dL — ABNORMAL HIGH (ref 8.9–10.3)
Chloride: 101 mmol/L (ref 101–111)
Creatinine, Ser: 0.87 mg/dL (ref 0.61–1.24)
GFR calc Af Amer: >60 mL/min (ref >60)
GFR calc non Af Amer: >60 mL/min (ref >60)
Glucose, Bld: 103 mg/dL — ABNORMAL HIGH (ref 65–99)
Potassium: 4.2 mmol/L (ref 3.5–5.1)
Sodium: 133 mmol/L — ABNORMAL LOW (ref 135–145)

## 2016-12-30 LAB — GLUCOSE, CAPILLARY
Glucose-Capillary: 118 mg/dL — ABNORMAL HIGH (ref 65–99)
Glucose-Capillary: 122 mg/dL — ABNORMAL HIGH (ref 65–99)
Glucose-Capillary: 108 mg/dL — ABNORMAL HIGH (ref 65–99)
Glucose-Capillary: 109 mg/dL — ABNORMAL HIGH (ref 65–99)

## 2016-12-30 LAB — LACTATE DEHYDROGENASE: LDH: 167 U/L (ref 98–192)

## 2016-12-30 LAB — PROTEIN, TOTAL: Total Protein: 7.9 g/dL (ref 6.5–8.1)

## 2016-12-30 MED ORDER — HEPARIN SODIUM (PORCINE) 5000 UNIT/ML IJ SOLN
5000.0000 [IU] | Freq: Three times a day (TID) | INTRAMUSCULAR | Status: DC
  Administered 2016-12-31 – 2017-01-02 (×7): 5000 [IU] via SUBCUTANEOUS
  Filled 2016-12-30 (×7): qty 1

## 2016-12-30 MED ORDER — PIPERACILLIN-TAZOBACTAM 3.375 G IVPB
3.3750 g | Freq: Three times a day (TID) | INTRAVENOUS | Status: AC
  Administered 2016-12-30 – 2017-01-02 (×10): 3.375 g via INTRAVENOUS
  Filled 2016-12-30 (×11): qty 50

## 2016-12-30 MED ORDER — LIDOCAINE HCL (PF) 1 % IJ SOLN
INTRAMUSCULAR | Status: AC
  Filled 2016-12-30: qty 30

## 2016-12-30 NOTE — Progress Notes (Signed)
Changed PT water bottle and trach set up

## 2016-12-30 NOTE — Progress Notes (Signed)
PULMONARY / CRITICAL CARE MEDICINE   Name: Scott Rivera MRN: [id number] DOB: [birth year]    ADMISSION DATE:  12/22/2016 CONSULTATION DATE:  12/22/16  REFERRING MD:  Dr. Roderic Palau / EDP   CHIEF COMPLAINT:  Low O2 saturations   HISTORY OF PRESENT ILLNESS:   65 y/o M who presented with acute hypoxic respiratory failure.  Baseline nonverbal, trach / PEG and lives in a SNF since July 2018 after CVA.   Recent admit from 8/25-9/6 for sepsis in the setting of aspiration PNA, AKI. He was discharged 9/6 to a SNF for care.  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      The patient returns 9/7 with reports of low oxygen saturations (60's) at the SNF.  He was reportedly found with increased secretions, thick yellow sputum and tachycardia.  On arrival to the ER, the patient vomited on the staff.  Initial labs - Na 137, K 4.4, Cl 100, CO2 26, glucose 128, BUN 33, Sr Cr 1.10, alk phos 171, albumin 2.0, AST 25, ALT 39, T. Bilirubin 0.7, lactic acid 1.31, WBC 24.9, Hgb 10 and platelets 841.  CXR showed LLL mucus plugging with cut off sign and airspace disease.  Attempts at suctioning the patient were made without improvement in saturations.  Uncuffed trach was changed to a cuffed and the patient was placed on mechanical ventilation.  PCCM called for ICU admission.   SUBJECTIVE:  No acute events overnight. Pt remains on ATC.       VITAL SIGNS: BP (!) 141/64 (BP Location: Left Arm)   Pulse (!) 101   Temp 99.3 F (37.4 C) (Oral)   Resp (!) 22   Ht 5' 11"  (1.803 m)   Wt 160 lb 7.9 oz (72.8 kg)   SpO2 100%   BMI 22.38 kg/m   VENTILATOR SETTINGS: FiO2 (%):  [35 %-40 %] 35 %  INTAKE / OUTPUT:  Intake/Output Summary (Last 24 hours) at 12/30/16 1122 Last data filed at 12/30/16 1100  Gross per 24 hour  Intake             1110 ml  Output             2000 ml   Net             -890 ml     PHYSICAL EXAMINATION: General: chronically ill appearing male lying in bed  HEENT: MM pink/moist, #6 trach midline c/d/i Neuro: makes eye contact, tracks, no follow commands  CV: s1s2 rrr, no m/r/g PULM: even/non-labored, lungs bilaterally coarse, diminished LLL VE:HMCN, non-tender, bsx4 active  Extremities: warm/dry, no edema, chronic extremity changes c/w hx of CVA  Skin: no rashes or lesions, stage IV sacral decubitus   LABS:  BMET  Recent Labs Lab 12/28/16 0406 12/29/16 0331 12/30/16 0402  NA 134* 134* 133*  K 4.1 3.9 4.2  CL 101 101 101  CO2 26 26 25   BUN 25* 25* 23*  CREATININE 0.99 1.07 0.87  GLUCOSE 92 114* 103*    Electrolytes  Recent Labs Lab 12/23/16 1924  12/24/16 0330 12/24/16 1604  12/28/16 0406 12/29/16 0331 12/30/16 0402  CALCIUM  --   < > 10.0  --   < > 10.0 10.0 10.4*  MG 2.0  --  2.1 2.0  --   --  1.8  --   PHOS 3.2  --  3.3 3.3  --   --   --   --   < > = values in this interval not displayed.  CBC  Recent Labs Lab 12/26/16 0513 12/28/16 0406 12/30/16 0402  WBC 14.0* 16.0* 21.2*  HGB 7.8* 9.2* 9.8*  HCT 25.5* 29.4* 32.1*  PLT 700* 765* 870*    Coag's No results for input(s): APTT, INR in the last 168 hours.  Sepsis Markers  Recent Labs Lab 12/29/16 0331  LATICACIDVEN 1.3    ABG No results for input(s): PHART, PCO2ART, PO2ART in the last 168 hours.  Liver Enzymes  Recent Labs Lab 12/28/16 0406 12/29/16 0331  AST 16 14*  ALT 20 18  ALKPHOS 117 108  BILITOT 0.4 0.2*  ALBUMIN 1.7* 1.8*    Cardiac Enzymes  Recent Labs Lab 12/23/16 2140 12/24/16 0330 12/24/16 0824  TROPONINI <0.03 <0.03 0.03*    Glucose  Recent Labs Lab 12/29/16 0847 12/29/16 1253 12/29/16 1621 12/29/16 2018 12/29/16 2351 12/30/16 0735  GLUCAP 122* 124* 106* 118* 103* 118*    Imaging Ct Chest Wo Contrast  Result Date: 12/29/2016 CLINICAL DATA:  66 year old male with shortness of breath. Pleural  effusion. Possible pneumonia. EXAM: CT CHEST WITHOUT CONTRAST TECHNIQUE: Multidetector CT imaging of the chest was performed following the standard protocol without IV contrast. COMPARISON:  Noncontrast CT Abdomen and Pelvis 10/26/2016. Noncontrast Chest CT 07/09/2014. FINDINGS: Cardiovascular: Calcified coronary artery atherosclerosis. Calcified aortic atherosclerosis. No pericardial effusion. Mediastinum/Nodes: Stable subcentimeter mediastinal lymph nodes since 2016. Lungs/Pleura: Tracheostomy tube in place.  No adverse features. On the right side the major airways are patent aside from some atelectatic changes. No confluent right lung opacity aside from mild dependent atelectasis. No right pleural effusion. On the left side the major airways are patent through the segmental branches. However there is confluent opacity throughout the left lower lobe with air bronchograms. Superimposed layering and mildly lobulated left pleural effusion has increased since July and is moderate. There is superimposed peribronchial nodularity in the medial aspect of the left upper lobe (series 8, image 29). The lingula is spared. Upper Abdomen: Stable noncontrast visible upper abdomen since July aside from increased retained stool to splenic flexure. Musculoskeletal: Thoracic spine degeneration. Chronic surgical clips along the left seventh/ eighth lateral intercostal space, unchanged since 2016. Chronic left posterolateral rib deformities also are stable. No acute osseous abnormality identified. IMPRESSION: 1. Left lower lobe consolidation suspicious for pneumonia. Superimposed moderate size partially layering partially loculated left pleural effusion which is increased since July. 2. Right lung atelectasis. 3. Tracheostomy in place with no adverse features. 4. Aortic Atherosclerosis (ICD10-I70.0). Calcified coronary artery atherosclerosis. Electronically Signed   By: Genevie Ann M.D.   On: 12/29/2016 18:04     STUDIES:  CT Chest  9/14 >> LLL consolidation suspicious for PNA, superimposed moderate layering / partially loculated left pleural effusion increased since July, right atelectasis, trach in good position  CULTURES: BCx2 9/7 >> negative   Tracheal Aspirate 9/7 >> (not sent)    ANTIBIOTICS: Vanco 9/7 >> 9/13 Zosyn 9/7 >> 9/13  SIGNIFICANT EVENTS: 9/06  Discharge after admit with aspiration PNA, AKI 9/07  Re-admit with acute hypoxic respiratory failure   LINES/TUBES: Trach (cuffed changed) 9/7 >>  DISCUSSION: 66 y/o M with hx of severe cerebellar CVA / non-verbal status, SNF resident with trach / PEG readmitted 9/7 (after d/c 9/6) for worsening of hypoxic respiratory failure.  CXR concerning for increased L sided airspace disease and mucus plugging.  Off vent but now has  large left loculated effusion.  CT chest demonstrates consolidation / partially loculated effusion 9/14.      ASSESSMENT / PLAN:  Acute on chronic respiratory failure. Aspiration pneumonia with mucus plugging (NOS) Large left loculated pleural effusion  Tracheostomy status. P: ATC as tolerated  Patient is not a candidate for VATS  Duoneb Q6 CT images reviewed, partially loculated, will need chest tube under CT / US guidance for placement.   IR consulted for above  Continue zosyn for additional 3 days given loculated effusion for 10 days total Follow intermittent CXR   Hx of HTN. CKD 3.  At risk for fluid and electrolyte imbalance  Dysphagia.  Anemia of critical illness and chronic disease. 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 Will update on plan of care once determined (IR chest tube vs PCCM)   Noe Gens, NP-C [facility] Pgr: [phone] or if no answer [phone] 12/30/2016, 11:28 AM

## 2016-12-30 NOTE — Progress Notes (Signed)
[place] TEAM 1 - Stepdown/ICU TEAM  Scott Rivera  CXK:[id number] DOB: [birth year] DOA: 12/22/2016 PCP: Patient, No Pcp Per    Brief Narrative:  66 y/o M who presented with acute hypoxic respiratory failure after he was found with increased secretions and thick yellow sputum.  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   He was admitted 8/25 > 9/6 for sepsis in the setting of aspiration PNA w/ AKI.       In the ED a CXR showed LLL mucus plugging with cut off sign and airspace disease.  Attempts at suctioning the patient were made without improvement in saturations.  Uncuffed trach was changed to a cuffed and the patient was placed on mechanical ventilation.  Significant Events: 9/06  Discharge after admit with aspiration PNA, AKI 9/07  Re-admit with acute hypoxic respiratory failure due to mucus plugging  9/12 [facility] assumed care   Subjective: No evidence of distress.  Pt is non communicative but awake.   Assessment & Plan:  Acute on chronic hypoxic respiratory failure due to Aspiration pneumonia with mucus plugging - chronic tracheostomy To complete 10 days total vanc/zosyn - trach care per PCCM - appears to be stabilizing on trach collar alone presently - wean O2 as able   Large L loculated pleural effusion  Progressive since admit - PCCM considering options for chest tube drainage - agree pt is not appropriate for VATS  HTN BP improved w/ adjustments made yesterday - follow w/o change today    CKD Stage 3 Renal function is presently stable   Recent Labs Lab 12/24/16 0330 12/25/16 0256 12/28/16 0406 12/29/16 0331 12/30/16 0402  CREATININE 1.15 1.01 0.99 1.07 0.87    Dysphagia - Chronic PEG tube - Severe protein malnutrition Cont tube feeds via PEG   Anemia of critical illness and chronic disease Has been transfused 1U PRBC this admit thus far - no evidence of blood loss - suspect anemia  due to recurring critical illness, poor nutritional status, and CKD  Recent Labs Lab 12/24/16 0330 12/25/16 0256 12/26/16 0513 12/28/16 0406 12/30/16 0402  HGB 7.3* 6.8* 7.8* 9.2* 9.8*    Hx of cerebellar CVA with aphasia This has been a devastating event w/ no measurable recovery noted apart from pt regaining consciousness - Palliative Care has met with his sister again, who re-affirms her unwavering desire to continue full aggressive medical care   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 rate (!) 29, height '5\' 11"'$  (1.803 m), weight 72.8 kg (160 lb 7.9 oz), SpO2 100 %.  Intake/Output Summary (Last 24 hours) at 12/30/16 1157 Last data filed at 12/30/16 1121  Gross per 24 hour  Intake           1227.5 ml  Output             2000 ml  Net           -772.5 ml   Filed Weights   12/28/16 1900 12/29/16 0500  12/30/16 0442  Weight: 69 kg (152 lb 1.9 oz) 72.8 kg (160 lb 7.9 oz) 72.8 kg (160 lb 7.9 oz)    Examination: General: No acute respiratory distress on trach collar  Lungs: poor air movement in L base - no wheezing  Cardiovascular: RRR Abdomen: Nontender, nondistended, soft, bowel sounds positive Extremities: No edema B - heel sparing boots in place B LE   CBC:  Recent Labs Lab 12/24/16 0330 12/25/16 0256 12/26/16 0513 12/28/16 0406 12/30/16 0402  WBC 17.6* 14.6* 14.0* 16.0* 21.2*  HGB 7.3* 6.8* 7.8* 9.2* 9.8*  HCT 23.9* 22.4* 25.5* 29.4* 32.1*  MCV 93.0 93.3 90.7 90.5 91.5  PLT 685* 699* 700* 765* 883*   Basic Metabolic Panel:  Recent Labs Lab 12/23/16 1346 12/23/16 1924   12/24/16 0330 12/24/16 1604 12/25/16 0256 12/28/16 0406 12/29/16 0331 12/30/16 0402  NA  --   --   < > 138  --  141 134* 134* 133*  K  --   --   < > 4.0  --  3.9 4.1 3.9 4.2  CL  --   --   < > 106  --  110 101 101 101  CO2  --   --   < > 24  --  _0 GLUCOSE  --   --   < > 103*  --  104* 92 114* 103*  BUN  --   --   < > 31*  --  26* 25* 25* 23*  CREATININE  --   --   < > 1.15  --  1.01 0.99 1.07 0.87  CALCIUM  --   --   < > 10.0  --  10.3 10.0 10.0 10.4*  MG 2.0 2.0  --  2.1 2.0  --   --  1.8  --   PHOS 3.7 3.2  --  3.3 3.3  --   --   --   --   < > = values in this interval not displayed. GFR: Estimated Creatinine Clearance: 86 mL/min (by C-G formula based on SCr of 0.87 mg/dL).  Liver Function Tests:  Recent Labs Lab 12/28/16 0406 12/29/16 0331  AST 16 14*  ALT 20 18  ALKPHOS 117 108  BILITOT 0.4 0.2*  PROT 7.1 7.4  ALBUMIN 1.7* 1.8*    Cardiac Enzymes:  Recent Labs Lab 12/23/16 2140 12/24/16 0330 12/24/16 0824  TROPONINI <0.03 <0.03 0.03*    HbA1C: Hgb A1c MFr Bld  Date/Time Value Ref Range Status  12/09/2016 02:19 PM 5.7 (H) 4.8 - 5.6 % Final    Comment:    (NOTE) Pre diabetes:          5.7%-6.4% Diabetes:              >6.4% Glycemic control for   <7.0% adults with diabetes   10/21/2016 02:53 AM 5.4 4.8 - 5.6 % Final    Comment:    (NOTE)         Pre-diabetes: 5.7 - 6.4         Diabetes: >6.4         Glycemic control for adults with diabetes: <7.0     CBG:  Recent Labs Lab 12/29/16 1621 12/29/16 2018 12/29/16 2351 12/30/16 0735 12/30/16 1134  GLUCAP 106* 118* 103* 118* 122*    Recent Results (from the past 240 hour(s))  Blood Culture (routine x 2)     Status: None   Collection Time: 12/22/16 12:17 PM  Result Value Ref Range Status   Specimen Description BLOOD RIGHT HAND  Final   Special Requests IN PEDIATRIC BOTTLE Blood Culture adequate volume  Final   Culture NO GROWTH 5 DAYS  Final   Report Status 12/27/2016 FINAL   Final  Blood Culture (routine x 2)     Status: None   Collection Time: 12/22/16 12:30 PM  Result Value Ref Range Status   Specimen Description BLOOD RIGHT HAND  Final   Special Requests IN PEDIATRIC BOTTLE Blood Culture adequate volume  Final   Culture NO GROWTH 5 DAYS  Final   Report Status 12/27/2016 FINAL  Final     Scheduled Meds: . amLODipine  10 mg Per Tube Daily  . aspirin  325 mg Per Tube Daily  . chlorhexidine gluconate (MEDLINE KIT)  15 mL Mouth Rinse BID  . cloNIDine  0.1 mg Per Tube TID  . clopidogrel  75 mg Per Tube Daily  . famotidine  20 mg Per Tube Q12H  . feeding supplement (PRO-STAT SUGAR FREE 64)  30 mL Per Tube TID  . free water  200 mL Per Tube Q8H  . gabapentin  200 mg Per Tube QHS  . heparin  5,000 Units Subcutaneous Q8H  . ipratropium-albuterol  3 mL Nebulization Q6H  . mouth rinse  15 mL Mouth Rinse QID  . metoprolol tartrate  10 mg Per Tube BID  . multivitamin with minerals  1 tablet Per Tube Daily  . pravastatin  20 mg Per Tube q1800  . thiamine  100 mg Per Tube Daily     LOS: 8 days   Cherene Altes, MD [facility] Office  [phone] Pager - Text Page per Amion as per below:  On-Call/Text Page:      [email or web address]      password [email or web address]  If 7PM-7AM, please contact night-coverage [email or web address] Password [email or web address] 12/30/2016, 11:57 AM

## 2016-12-31 ENCOUNTER — Inpatient Hospital Stay (HOSPITAL_COMMUNITY): Payer: Medicare HMO

## 2016-12-31 LAB — GLUCOSE, CAPILLARY
Glucose-Capillary: 112 mg/dL — ABNORMAL HIGH (ref 65–99)
Glucose-Capillary: 112 mg/dL — ABNORMAL HIGH (ref 65–99)
Glucose-Capillary: 122 mg/dL — ABNORMAL HIGH (ref 65–99)
Glucose-Capillary: 123 mg/dL — ABNORMAL HIGH (ref 65–99)
Glucose-Capillary: 129 mg/dL — ABNORMAL HIGH (ref 65–99)
Glucose-Capillary: 133 mg/dL — ABNORMAL HIGH (ref 65–99)
Glucose-Capillary: 99 mg/dL (ref 65–99)

## 2016-12-31 LAB — CBC
HCT: 29.6 % — ABNORMAL LOW (ref 39.0–52.0)
Hemoglobin: 9.1 g/dL — ABNORMAL LOW (ref 13.0–17.0)
MCH: 28 pg (ref 26.0–34.0)
MCHC: 30.7 g/dL (ref 30.0–36.0)
MCV: 91.1 fL (ref 78.0–100.0)
Platelets: 645 10*3/uL — ABNORMAL HIGH (ref 150–400)
RBC: 3.25 MIL/uL — ABNORMAL LOW (ref 4.22–5.81)
RDW: 16.6 % — ABNORMAL HIGH (ref 11.5–15.5)
WBC: 19.1 10*3/uL — ABNORMAL HIGH (ref 4.0–10.5)

## 2016-12-31 LAB — LACTATE DEHYDROGENASE: LDH: 169 U/L (ref 98–192)

## 2016-12-31 NOTE — Progress Notes (Signed)
[place] TEAM 1 - Stepdown/ICU TEAM  Scott Rivera  MRN:[id number] DOB: 06/21/1950 DOA: 12/22/2016 PCP: Patient, No Pcp Per    Brief Narrative:  66 y/o M who presented with acute hypoxic respiratory failure after he was found with increased secretions and thick yellow sputum.  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   He was admitted 8/25 > 9/6 for sepsis in the setting of aspiration PNA w/ AKI.       In the ED a CXR showed LLL mucus plugging with cut off sign and airspace disease.  Attempts at suctioning the patient were made without improvement in saturations.  Uncuffed trach was changed to a cuffed and the patient was placed on mechanical ventilation.  Significant Events: 9/06  Discharge after admit with aspiration PNA, AKI 9/07  Re-admit with acute hypoxic respiratory failure due to mucus plugging  9/12 [facility] assumed care   Subjective: Pt is resting comfortably at the time of my visit.  He does not appear to be suffering w/ uncontrolled pain or respiratory distress.    Assessment & Plan:  Acute on chronic hypoxic respiratory failure due to Aspiration pneumonia with mucus plugging and large L pleural effusion - chronic tracheostomy To complete 10 days total vanc/zosyn - trach care per PCCM - appears to be stabilizing on trach collar alone presently - wean O2 as able   Large L loculated pleural effusion  Progressive since admit - agree pt is not appropriate for VATS - to attempt complete drainage in Radiology today   HTN BP controlled   CKD Stage 3 Renal function is presently stable   Recent Labs Lab 12/25/16 0256 12/28/16 0406 12/29/16 0331 12/30/16 0402  CREATININE 1.01 0.99 1.07 0.87    Dysphagia - Chronic PEG tube - Severe protein malnutrition Cont tube feeds via PEG   Anemia of critical illness and chronic disease Has been transfused 1U PRBC this admit thus far - no evidence of  blood loss - suspect anemia due to recurring critical illness, poor nutritional status, and CKD  Recent Labs Lab 12/25/16 0256 12/26/16 0513 12/28/16 0406 12/30/16 0402 12/31/16 0342  HGB 6.8* 7.8* 9.2* 9.8* 9.1*    Hx of cerebellar CVA with aphasia This has been a devastating event w/ no measurable recovery noted apart from pt regaining consciousness - Palliative Care has met with his sister again, who re-affirms her unwavering desire to continue full aggressive medical care   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 rate (!) 35, height 5' 11" (1.803 m), weight 69.6 kg (153 lb 7 oz), SpO2 97 %.  Intake/Output Summary (Last 24 hours) at 12/31/16 1157 Last data filed at 12/31/16 0600  Gross per 24 hour  Intake           1082.5 ml  Output             1800 ml  Net           -  717.5 ml   Filed Weights   12/29/16 0500 12/30/16 0442 12/31/16 0342  Weight: 72.8 kg (160 lb 7.9 oz) 72.8 kg (160 lb 7.9 oz) 69.6 kg (153 lb 7 oz)    Examination: General: No acute respiratory distress - on trach collar  Lungs: poor air movement in L base  Cardiovascular: RRR - no M or rub  Abdomen: Nontender, nondistended, soft, bowel sounds positive - PEG insertion clean and dry  Extremities: No edema B - heel sparing boots B LE   CBC:  Recent Labs Lab 12/25/16 0256 12/26/16 0513 12/28/16 0406 12/30/16 0402 12/31/16 0342  WBC 14.6* 14.0* 16.0* 21.2* 19.1*  HGB 6.8* 7.8* 9.2* 9.8* 9.1*  HCT 22.4* 25.5* 29.4* 32.1* 29.6*  MCV 93.3 90.7 90.5 91.5 91.1  PLT 699* 700* 765* 870* 645*     Basic Metabolic Panel:  Recent Labs Lab 12/24/16 1604 12/25/16 0256 12/28/16 0406 12/29/16 0331 12/30/16 0402  NA  --  141 134* 134* 133*  K  --  3.9 4.1 3.9 4.2  CL  --  110 101 101 101  CO2  --  25 26 26 25  GLUCOSE  --  104* 92 114* 103*  BUN  --  26* 25* 25* 23*  CREATININE  --  1.01 0.99 1.07 0.87  CALCIUM  --  10.3 10.0 10.0 10.4*  MG 2.0  --   --  1.8  --   PHOS 3.3  --   --   --   --    GFR: Estimated Creatinine Clearance: 82.2 mL/min (by C-G formula based on SCr of 0.87 mg/dL).  Liver Function Tests:  Recent Labs Lab 12/28/16 0406 12/29/16 0331 12/30/16 0402  AST 16 14*  --   ALT 20 18  --   ALKPHOS 117 108  --   BILITOT 0.4 0.2*  --   PROT 7.1 7.4 7.9  ALBUMIN 1.7* 1.8*  --     HbA1C: Hgb A1c MFr Bld  Date/Time Value Ref Range Status  12/09/2016 02:19 PM 5.7 (H) 4.8 - 5.6 % Final    Comment:    (NOTE) Pre diabetes:          5.7%-6.4% Diabetes:              >6.4% Glycemic control for   <7.0% adults with diabetes   10/21/2016 02:53 AM 5.4 4.8 - 5.6 % Final    Comment:    (NOTE)         Pre-diabetes: 5.7 - 6.4         Diabetes: >6.4         Glycemic control for adults with diabetes: <7.0     CBG:  Recent Labs Lab 12/30/16 1640 12/30/16 2003 12/30/16 2336 12/31/16 0339 12/31/16 0804  GLUCAP 108* 109* 112* 99 123*    Recent Results (from the past 240 hour(s))  Blood Culture (routine x 2)     Status: None   Collection Time: 12/22/16 12:17 PM  Result Value Ref Range Status   Specimen Description BLOOD RIGHT HAND  Final   Special Requests IN PEDIATRIC BOTTLE Blood Culture adequate volume  Final   Culture NO GROWTH 5 DAYS  Final   Report Status 12/27/2016 FINAL  Final  Blood Culture (routine x 2)     Status: None   Collection Time: 12/22/16 12:30 PM  Result Value Ref Range Status   Specimen Description BLOOD RIGHT HAND  Final   Special Requests IN PEDIATRIC BOTTLE Blood Culture   adequate volume  Final   Culture NO GROWTH 5 DAYS   Final   Report Status 12/27/2016 FINAL  Final     Scheduled Meds: . amLODipine  10 mg Per Tube Daily  . aspirin  325 mg Per Tube Daily  . chlorhexidine gluconate (MEDLINE KIT)  15 mL Mouth Rinse BID  . cloNIDine  0.1 mg Per Tube TID  . clopidogrel  75 mg Per Tube Daily  . famotidine  20 mg Per Tube Q12H  . feeding supplement (PRO-STAT SUGAR FREE 64)  30 mL Per Tube TID  . free water  200 mL Per Tube Q8H  . gabapentin  200 mg Per Tube QHS  . heparin  5,000 Units Subcutaneous Q8H  . ipratropium-albuterol  3 mL Nebulization Q6H  . mouth rinse  15 mL Mouth Rinse QID  . metoprolol tartrate  10 mg Per Tube BID  . multivitamin with minerals  1 tablet Per Tube Daily  . pravastatin  20 mg Per Tube q1800  . thiamine  100 mg Per Tube Daily     LOS: 9 days    T. , MD [facility] Office  [phone] Pager - Text Page per Amion as per below:  On-Call/Text Page:      [email or web address]      password [email or web address]  If 7PM-7AM, please contact night-coverage [email or web address] Password [email or web address] 12/31/2016, 11:57 AM     

## 2016-12-31 NOTE — Progress Notes (Addendum)
  Request seen for Pigtail Chest Tube placement for possibly loculated effusion.  Images reviewed by Dr. Earleen Newport.  He recommends attempt Thoracentesis first.  Pease order US Thoracentesis.  (I spoke with provider who answered [phone] yesterday around 2 pm and recommended thoracentesis)  Alianah Lofton S Dianna Deshler PA-C 12/31/2016 7:58 AM

## 2017-01-01 ENCOUNTER — Inpatient Hospital Stay (HOSPITAL_COMMUNITY): Payer: Medicare HMO

## 2017-01-01 DIAGNOSIS — J189 Pneumonia, unspecified organism: Secondary | ICD-10-CM

## 2017-01-01 LAB — COMPREHENSIVE METABOLIC PANEL
ALT: 36 U/L (ref 17–63)
AST: 37 U/L (ref 15–41)
Albumin: 1.7 g/dL — ABNORMAL LOW (ref 3.5–5.0)
Alkaline Phosphatase: 142 U/L — ABNORMAL HIGH (ref 38–126)
Anion gap: 9 (ref 5–15)
BUN: 34 mg/dL — ABNORMAL HIGH (ref 6–20)
CO2: 25 mmol/L (ref 22–32)
Calcium: 9.9 mg/dL (ref 8.9–10.3)
Chloride: 100 mmol/L — ABNORMAL LOW (ref 101–111)
Creatinine, Ser: 1.22 mg/dL (ref 0.61–1.24)
GFR calc Af Amer: >60 mL/min (ref >60)
GFR calc non Af Amer: >60 mL/min (ref >60)
Glucose, Bld: 103 mg/dL — ABNORMAL HIGH (ref 65–99)
Potassium: 4 mmol/L (ref 3.5–5.1)
Sodium: 134 mmol/L — ABNORMAL LOW (ref 135–145)
Total Bilirubin: 0.5 mg/dL (ref 0.3–1.2)
Total Protein: 7.4 g/dL (ref 6.5–8.1)

## 2017-01-01 LAB — CBC
HCT: 28.1 % — ABNORMAL LOW (ref 39.0–52.0)
Hemoglobin: 8.7 g/dL — ABNORMAL LOW (ref 13.0–17.0)
MCH: 28.1 pg (ref 26.0–34.0)
MCHC: 31 g/dL (ref 30.0–36.0)
MCV: 90.6 fL (ref 78.0–100.0)
Platelets: 750 10*3/uL — ABNORMAL HIGH (ref 150–400)
RBC: 3.1 MIL/uL — ABNORMAL LOW (ref 4.22–5.81)
RDW: 16.9 % — ABNORMAL HIGH (ref 11.5–15.5)
WBC: 15.7 10*3/uL — ABNORMAL HIGH (ref 4.0–10.5)

## 2017-01-01 LAB — GLUCOSE, CAPILLARY
Glucose-Capillary: 118 mg/dL — ABNORMAL HIGH (ref 65–99)
Glucose-Capillary: 122 mg/dL — ABNORMAL HIGH (ref 65–99)
Glucose-Capillary: 100 mg/dL — ABNORMAL HIGH (ref 65–99)
Glucose-Capillary: 134 mg/dL — ABNORMAL HIGH (ref 65–99)
Glucose-Capillary: 120 mg/dL — ABNORMAL HIGH (ref 65–99)

## 2017-01-01 MED ORDER — FREE WATER
200.0000 mL | Freq: Four times a day (QID) | Status: DC
  Administered 2017-01-01 – 2017-01-09 (×28): 200 mL

## 2017-01-01 MED ORDER — LIDOCAINE HCL (PF) 1 % IJ SOLN
INTRAMUSCULAR | Status: AC
  Filled 2017-01-01: qty 30

## 2017-01-01 MED ORDER — SODIUM CHLORIDE 0.9 % IV BOLUS (SEPSIS)
500.0000 mL | Freq: Once | INTRAVENOUS | Status: AC
  Administered 2017-01-01: 500 mL via INTRAVENOUS

## 2017-01-01 NOTE — Progress Notes (Signed)
PULMONARY / CRITICAL CARE MEDICINE   Name: Scott Rivera MRN: [id number] DOB: 12-28-1950    ADMISSION DATE:  12/22/2016 CONSULTATION DATE:  12/22/16  REFERRING MD:  Dr. Roderic Palau / EDP   CHIEF COMPLAINT:  Low O2 saturations   HISTORY OF PRESENT ILLNESS:   66 y/o M who presented with acute hypoxic respiratory failure.  Baseline nonverbal, trach / PEG and lives in a SNF since July 2018 after CVA.   Recent admit from 8/25-9/6 for sepsis in the setting of aspiration PNA, AKI. He was discharged 9/6 to a SNF for care.  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      The patient returns 9/7 with reports of low oxygen saturations (60's) at the SNF.  He was reportedly found with increased secretions, thick yellow sputum and tachycardia.  On arrival to the ER, the patient vomited on the staff.  Initial labs - Na 137, K 4.4, Cl 100, CO2 26, glucose 128, BUN 33, Sr Cr 1.10, alk phos 171, albumin 2.0, AST 25, ALT 39, T. Bilirubin 0.7, lactic acid 1.31, WBC 24.9, Hgb 10 and platelets 841.  CXR showed LLL mucus plugging with cut off sign and airspace disease.  Attempts at suctioning the patient were made without improvement in saturations.  Uncuffed trach was changed to a cuffed and the patient was placed on mechanical ventilation.  PCCM called for ICU admission.   SUBJECTIVE:   No acute events.  IR recommending thoracentesis > order placed under US guidance, pending AM 9/17   VITAL SIGNS: BP (!) 114/56   Pulse (!) 116   Temp 99.9 F (37.7 C) (Axillary)   Resp (!) 30   Ht 5' 11"  (1.803 m)   Wt 69.7 kg (153 lb 10.6 oz)   SpO2 98%   BMI 21.43 kg/m   VENTILATOR SETTINGS: FiO2 (%):  [35 %-40 %] 40 %  INTAKE / OUTPUT:  Intake/Output Summary (Last 24 hours) at 01/01/17 0900 Last data filed at 01/01/17 0820  Gross per 24 hour  Intake             1300 ml   Output             1925 ml  Net             -625 ml     PHYSICAL EXAMINATION: General: chronically ill appearing male lying in bed  HEENT: MM pink/moist, #6 trach midline c/d/i Neuro: makes eye contact, tracks but does not follow commands CV: s1s2 rrr, no m/r/g PULM: even/non-labored, coarse bilaterally, diminished left base GI: soft, non-tender, BS x 4, active  Extremities: warm/dry, no edema, chronic extremity changes c/w hx of CVA  Skin: no rashes or lesions, stage IV sacral decubitus   LABS:  BMET  Recent Labs Lab 12/29/16 0331 12/30/16 0402 01/01/17 0412  NA 134* 133* 134*  K 3.9 4.2 4.0  CL 101 101 100*  CO2 26 25 25   BUN 25* 23* 34*  CREATININE 1.07 0.87 1.22  GLUCOSE 114* 103* 103*    Electrolytes  Recent Labs Lab 12/29/16 0331 12/30/16 0402 01/01/17 0412  CALCIUM 10.0 10.4* 9.9  MG 1.8  --   --     CBC  Recent Labs Lab 12/30/16 0402 12/31/16 0342 01/01/17 0412  WBC 21.2* 19.1* 15.7*  HGB 9.8* 9.1* 8.7*  HCT 32.1* 29.6* 28.1*  PLT 870* 645* 750*    Coag's No results for input(s): APTT, INR in the last 168 hours.  Sepsis Markers  Recent Labs Lab 12/29/16 0331  LATICACIDVEN 1.3    ABG No results for input(s): PHART, PCO2ART, PO2ART in the last 168 hours.  Liver Enzymes  Recent Labs Lab 12/28/16 0406 12/29/16 0331 01/01/17 0412  AST 16 14* 37  ALT 20 18 36  ALKPHOS 117 108 142*  BILITOT 0.4 0.2* 0.5  ALBUMIN 1.7* 1.8* 1.7*    Cardiac Enzymes No results for input(s): TROPONINI, PROBNP in the last 168 hours.  Glucose  Recent Labs Lab 12/31/16 0804 12/31/16 1327 12/31/16 1644 12/31/16 1942 12/31/16 2353 01/01/17 0353  GLUCAP 123* 129* 112* 122* 133* 118*    Imaging No results found.   STUDIES:  CT Chest 9/14 >> LLL consolidation suspicious for PNA, superimposed moderate layering / partially loculated left pleural effusion increased since July, right atelectasis, trach in good position  CULTURES: BCx2 9/7  >> negative   Tracheal Aspirate 9/7 >> (not sent)   ANTIBIOTICS: Vanco 9/7 >> 9/13 Zosyn 9/7 >> 9/13  SIGNIFICANT EVENTS: 9/06  Discharge after admit with aspiration PNA, AKI 9/07  Re-admit with acute hypoxic respiratory failure   LINES/TUBES: Trach (cuffed changed) 9/7 >>  DISCUSSION: 66 y/o M with hx of severe cerebellar CVA / non-verbal status, SNF resident with trach / PEG readmitted 9/7 (after d/c 9/6) for worsening of hypoxic respiratory failure.  CXR concerning for increased L sided airspace disease and mucus plugging.  Off vent but now has large left loculated effusion.  CT chest demonstrates consolidation / partially loculated effusion 9/14.      ASSESSMENT / PLAN:  Acute on chronic respiratory failure. Aspiration pneumonia with mucus plugging (NOS) Large left loculated pleural effusion  Tracheostomy status. P: Continue ATC as tolerated  Patient is not a candidate for VATS  Duoneb Q6 IR consulted, recommending thoracentesis under US guidance prior to chest tube, pending as of AM 9/17 Zosyn stop date 9/18 for total 10 days Follow intermittent CXR   Hx of HTN. CKD 3.  At risk for fluid and electrolyte imbalance  Dysphagia.  Anemia of critical illness and chronic disease. 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   Montey Hora, [facility] Pager: [phone]  or [phone] 01/01/2017, 9:06 AM  STAFF NOTE: I, Merrie Roof, MD FACP have personally reviewed patient's available data, including medical history, events of note, physical examination and test results as part of my evaluation. I have discussed with resident/NP and other care providers such as pharmacist, RN and RRT. In addition, I personally evaluated patient and  elicited key findings of: int agitation, on TC, no overt distress, ronchi and reduced BS, abdo soft, pcxr I reviewed shows hazzines left mid and lower lobes, CT I reviewed shows loculated small effusion, agree this needs to be direct visualized with [place] or CT guidance for thora, maintain on TC, secretions are moderate, repeat pcxr post thora , send for culture diff, prot, glu etc, will follow result and interpret, maintain polymicrobial coverage   Lavon Paganini. Titus Mould, MD, Bee Pgr: [facility] 01/01/2017 2:35 PM

## 2017-01-01 NOTE — Care Management Important Message (Signed)
Important Message  Patient Details  Name: Scott Rivera MRN: [id number] Date of Birth: 09/08/1950   Medicare Important Message Given:  Yes    Nathen May 01/01/2017, 10:37 AM

## 2017-01-01 NOTE — Progress Notes (Addendum)
Patient ID: Scott Rivera, male   DOB: 01-11-1951, 66 y.o.   MRN: [id number]  Scheduled for portable Left thoracentesis today [place] of left chest revealed very small amount of pleural effusion Small pocket; small window Loculated collection  Discussed with Dr Juleen China seeing pt. LM to Dr Blair Heys need to consider CT guided chest tube drain tomorrow  Will review imaging with Radiologist

## 2017-01-01 NOTE — Progress Notes (Signed)
[place] TEAM 1 - Stepdown/ICU TEAM  Scott Rivera  UEA:[id number] DOB: 03-21-1951 DOA: 12/22/2016 PCP: Patient, No Pcp Per    Brief Narrative:  66 y/o M who presented with acute hypoxic respiratory failure after he was found with increased secretions and thick yellow sputum.  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   He was admitted 8/25 > 9/6 for sepsis in the setting of aspiration PNA w/ AKI.       In the ED a CXR showed LLL mucus plugging with cut off sign and airspace disease.  Attempts at suctioning the patient were made without improvement in saturations.  Uncuffed trach was changed to a cuffed and the patient was placed on mechanical ventilation.  Significant Events: 9/06  Discharge after admit with aspiration PNA, AKI 9/07  Re-admit with acute hypoxic respiratory failure due to mucus plugging  9/12 [facility] assumed care   Subjective: Pt is resting comfortably at the time of my visit.  His eyes are closed and he does not respond.    Assessment & Plan:  Acute on chronic hypoxic respiratory failure due to Aspiration pneumonia with mucus plugging and large L pleural effusion - chronic tracheostomy To complete 10 days total of abx tx - trach care per PCCM - appears to be stabilizing on trach collar alone presently - wean O2 as able   Large L loculated pleural effusion  Progressive since admit - agree pt is not appropriate for VATS - to attempt complete drainage in Radiology w/ an US guided thoracentesis (pending)  HTN BP controlled   CKD Stage 3 crt is climbing - fluid challenge today and f/u in AM  Recent Labs Lab 12/28/16 0406 12/29/16 0331 12/30/16 0402 01/01/17 0412  CREATININE 0.99 1.07 0.87 1.22    Dysphagia - Chronic PEG tube - Severe protein malnutrition Cont tube feeds via PEG   Anemia of critical illness and chronic disease Has been transfused 1U PRBC this admit - no evidence  of blood loss - suspect anemia due to recurring critical illness, poor nutritional status, and CKD  Recent Labs Lab 12/26/16 0513 12/28/16 0406 12/30/16 0402 12/31/16 0342 01/01/17 0412  HGB 7.8* 9.2* 9.8* 9.1* 8.7*    Hx of cerebellar CVA with aphasia This has been a devastating event w/ no measurable recovery noted apart from pt regaining consciousness - Palliative Care has met with his sister again, who re-affirms her unwavering desire to continue full aggressive medical care   Stage 4 sacral wound present prior to admission - cont wound care as per WOC suggestions   DVT prophylaxis: SQ heparin  Code Status: FULL CODE Family Communication: no family present at time of exam today Disposition Plan: SDU - will need to return to a SNF at d/c  Consultants:  PCCM  Antimicrobials:  Vanco 9/7 > 9/12 Zosyn 9/7 >   Objective: Blood pressure 125/63, pulse 96, temperature 99.9 F (37.7 C), temperature source Axillary, resp. rate 20, height _0  (1.803 m), weight 69.7 kg (153 lb 10.6 oz), SpO2 95 %.  Intake/Output Summary (Last 24 hours) at 01/01/17 1143 Last data filed at 01/01/17 0820  Gross per 24 hour  Intake             1300 ml  Output             1925 ml  Net             -  625 ml   Filed Weights   12/30/16 0442 12/31/16 0342 01/01/17 0432  Weight: 72.8 kg (160 lb 7.9 oz) 69.6 kg (153 lb 7 oz) 69.7 kg (153 lb 10.6 oz)    Examination: General: No acute respiratory distress Lungs: poor air movement in L base - no wheezing  Cardiovascular: RRR w/ no M or rub  Abdomen: Nondistended, soft, bowel sounds positive Extremities: No edema B LE  CBC:  Recent Labs Lab 12/26/16 0513 12/28/16 0406 12/30/16 0402 12/31/16 0342 01/01/17 0412  WBC 14.0* 16.0* 21.2* 19.1* 15.7*  HGB 7.8* 9.2* 9.8* 9.1* 8.7*  HCT 25.5* 29.4* 32.1* 29.6* 28.1*  MCV 90.7 90.5 91.5 91.1 90.6  PLT 700* 765* 870* 645* 270*   Basic Metabolic Panel:  Recent Labs Lab 12/28/16 0406  12/29/16 0331 12/30/16 0402 01/01/17 0412  NA 134* 134* 133* 134*  K 4.1 3.9 4.2 4.0  CL 101 101 101 100*  CO2 _0 GLUCOSE 92 114* 103* 103*  BUN 25* 25* 23* 34*  CREATININE 0.99 1.07 0.87 1.22  CALCIUM 10.0 10.0 10.4* 9.9  MG  --  1.8  --   --    GFR: Estimated Creatinine Clearance: 58.7 mL/min (by C-G formula based on SCr of 1.22 mg/dL).  Liver Function Tests:  Recent Labs Lab 12/28/16 0406 12/29/16 0331 12/30/16 0402 01/01/17 0412  AST 16 14*  --  37  ALT 20 18  --  36  ALKPHOS 117 108  --  142*  BILITOT 0.4 0.2*  --  0.5  PROT 7.1 7.4 7.9 7.4  ALBUMIN 1.7* 1.8*  --  1.7*    HbA1C: Hgb A1c MFr Bld  Date/Time Value Ref Range Status  12/09/2016 02:19 PM 5.7 (H) 4.8 - 5.6 % Final    Comment:    (NOTE) Pre diabetes:          5.7%-6.4% Diabetes:              >6.4% Glycemic control for   <7.0% adults with diabetes   10/21/2016 02:53 AM 5.4 4.8 - 5.6 % Final    Comment:    (NOTE)         Pre-diabetes: 5.7 - 6.4         Diabetes: >6.4         Glycemic control for adults with diabetes: <7.0     CBG:  Recent Labs Lab 12/31/16 1644 12/31/16 1942 12/31/16 2353 01/01/17 0353 01/01/17 0817  GLUCAP 112* 122* 133* 118* 122*    Recent Results (from the past 240 hour(s))  Blood Culture (routine x 2)     Status: None   Collection Time: 12/22/16 12:17 PM  Result Value Ref Range Status   Specimen Description BLOOD RIGHT HAND  Final   Special Requests IN PEDIATRIC BOTTLE Blood Culture adequate volume  Final   Culture NO GROWTH 5 DAYS  Final   Report Status 12/27/2016 FINAL  Final  Blood Culture (routine x 2)     Status: None   Collection Time: 12/22/16 12:30 PM  Result Value Ref Range Status   Specimen Description BLOOD RIGHT HAND  Final   Special Requests IN PEDIATRIC BOTTLE Blood Culture adequate volume  Final   Culture NO GROWTH 5 DAYS  Final   Report Status 12/27/2016 FINAL  Final     Scheduled Meds: . amLODipine  10 mg Per Tube Daily  .  aspirin  325 mg Per Tube Daily  . chlorhexidine gluconate (MEDLINE KIT)  15 mL Mouth Rinse BID  . cloNIDine  0.1 mg Per Tube TID  . clopidogrel  75 mg Per Tube Daily  . famotidine  20 mg Per Tube Q12H  . feeding supplement (PRO-STAT SUGAR FREE 64)  30 mL Per Tube TID  . free water  200 mL Per Tube Q8H  . gabapentin  200 mg Per Tube QHS  . heparin  5,000 Units Subcutaneous Q8H  . ipratropium-albuterol  3 mL Nebulization Q6H  . mouth rinse  15 mL Mouth Rinse QID  . metoprolol tartrate  10 mg Per Tube BID  . multivitamin with minerals  1 tablet Per Tube Daily  . pravastatin  20 mg Per Tube q1800  . thiamine  100 mg Per Tube Daily     LOS: 10 days   Cherene Altes, MD [facility] Office  [phone] Pager - Text Page per Amion as per below:  On-Call/Text Page:      [email or web address]      password [email or web address]  If 7PM-7AM, please contact night-coverage [email or web address] Password [facility] 01/01/2017, 11:43 AM

## 2017-01-02 ENCOUNTER — Inpatient Hospital Stay (HOSPITAL_COMMUNITY): Payer: Medicare HMO

## 2017-01-02 DIAGNOSIS — N182 Chronic kidney disease, stage 2 (mild): Secondary | ICD-10-CM

## 2017-01-02 DIAGNOSIS — R1314 Dysphagia, pharyngoesophageal phase: Secondary | ICD-10-CM

## 2017-01-02 DIAGNOSIS — I63549 Cerebral infarction due to unspecified occlusion or stenosis of unspecified cerebellar artery: Secondary | ICD-10-CM

## 2017-01-02 HISTORY — DX: Chronic kidney disease, stage 2 (mild): N18.2

## 2017-01-02 LAB — GLUCOSE, CAPILLARY
Glucose-Capillary: 101 mg/dL — ABNORMAL HIGH (ref 65–99)
Glucose-Capillary: 112 mg/dL — ABNORMAL HIGH (ref 65–99)
Glucose-Capillary: 121 mg/dL — ABNORMAL HIGH (ref 65–99)
Glucose-Capillary: 130 mg/dL — ABNORMAL HIGH (ref 65–99)
Glucose-Capillary: 134 mg/dL — ABNORMAL HIGH (ref 65–99)
Glucose-Capillary: 134 mg/dL — ABNORMAL HIGH (ref 65–99)

## 2017-01-02 LAB — PROTIME-INR
INR: 0.99
Prothrombin Time: 13 seconds (ref 11.4–15.2)

## 2017-01-02 LAB — BASIC METABOLIC PANEL
Anion gap: 12 (ref 5–15)
BUN: 33 mg/dL — ABNORMAL HIGH (ref 6–20)
CO2: 22 mmol/L (ref 22–32)
Calcium: 10 mg/dL (ref 8.9–10.3)
Chloride: 101 mmol/L (ref 101–111)
Creatinine, Ser: 1.07 mg/dL (ref 0.61–1.24)
GFR calc Af Amer: >60 mL/min (ref >60)
GFR calc non Af Amer: >60 mL/min (ref >60)
Glucose, Bld: 106 mg/dL — ABNORMAL HIGH (ref 65–99)
Potassium: 4.2 mmol/L (ref 3.5–5.1)
Sodium: 135 mmol/L (ref 135–145)

## 2017-01-02 LAB — APTT: aPTT: 59 seconds — ABNORMAL HIGH (ref 24–36)

## 2017-01-02 MED ORDER — HEPARIN SODIUM (PORCINE) 5000 UNIT/ML IJ SOLN: 5000.0000 [IU] | Freq: Three times a day (TID) | INTRAMUSCULAR | Status: DC

## 2017-01-02 NOTE — Progress Notes (Signed)
PROGRESS NOTE    Scott Rivera  ZDG:[id number] DOB: 1950-05-23 DOA: 12/22/2016 PCP: Patient, No Pcp Per   Brief Narrative:  66 y/o M  PMHx  Severe Cerebellar CVA, HTN, CKD stage III, anemia due to CKD,   Presented with acute hypoxic respiratory failure after he was found with increased secretions and thick yellow sputum.  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    He was admitted 8/25 > 9/6 for sepsis in the setting of aspiration PNA w/ AKI.        In the ED a CXR showed LLL mucus plugging with cut off sign and airspace disease.  Attempts at suctioning the patient were made without improvement in saturations.  Uncuffed trach was changed to a cuffed and the patient was placed on mechanical ventilation.    Subjective: 9/18 A/O 0, patient will open eyes appears to lift right arm to command, wiggle toes to command. Nonverbal.   Assessment & Plan:   Active Problems:   Cerebellar infarct (HCC)   Essential hypertension   Pressure injury of skin   Dysphagia, post-stroke   PNA (pneumonia)  Acute on chronic respiratory failure with hypoxia -Complete 10 days anabiotic per [facility] M  LEFT loculated Pleural Effusion -Patient not appropriate for VATS -IR guided thoracentesis canceled. Unable to determine who canceled procedure. 9/18 requested IR drainage left loculated pleural effusion -DC feeding admitted night -DC subcutaneous heparin  Essential HTN     CKD stage III     Recent Labs Lab 12/28/16 0406 12/29/16 0331 12/30/16 0402 01/01/17 0412 01/02/17 0330  CREATININE 0.99 1.07 0.87 1.22 1.07  -baseline     Dysphagia -Chronic PEG tube -DC tube feeds in preparation for IR thoracentesis on 9/19  Severe protein calorie malnutrition   Anemia of critical illness/chronic disease  -Has been transfused 1U PRBC this admit - no evidence of blood loss - suspect anemia due to recurring critical illness,  poor nutritional status, and CKD    Recent Labs Lab 12/28/16 0406 12/30/16 0402 12/31/16 0342 01/01/17 0412  HGB 9.2* 9.8* 9.1* 8.7*  -Stable   Cerebellar CVA with aphasia   -This has been a devastating event w/ no measurable recovery noted apart from pt regaining consciousness - Palliative Care has met with his sister again, who re-affirms her unwavering desire to continue full aggressive medical care    Stage IV sacral decubitus ulcer -present prior to admission - cont wound care as per WOC suggestions     DVT prophylaxis: SCD/subcutaneous heparin Code Status: Full Family Communication: None Disposition Plan: SNF   Consultants:  [facility]    Procedures/Significant Events:  9/06  Discharge after admit with aspiration PNA, AKI 9/07  Re-admit with acute hypoxic respiratory failure due to mucus plugging  9/12 [facility] assumed care     I have personally reviewed and interpreted all radiology studies and my findings are as above.  VENTILATOR SETTINGS: TC    Cultures   Antimicrobials: Anti-infectives    Start     Dose/Rate Stop   12/30/16 1400  piperacillin-tazobactam (ZOSYN) IVPB 3.375 g     3.375 g 12.5 mL/hr over 240 Minutes 01/02/17 1855   12/26/16 0100  vancomycin (VANCOCIN) 500 mg in sodium chloride 0.9 % 100 mL IVPB     500 mg 100 mL/hr over 60 Minutes 12/28/16 0344   12/22/16 2200  vancomycin (VANCOCIN) IVPB 750 mg/150  ml premix  Status:  Discontinued     750 mg 150 mL/hr over 60 Minutes 12/25/16 2236   12/22/16 1800  piperacillin-tazobactam (ZOSYN) IVPB 3.375 g  Status:  Discontinued     3.375 g 12.5 mL/hr over 240 Minutes 12/28/16 1934   12/22/16 1200  vancomycin (VANCOCIN) IVPB 1000 mg/200 mL premix     1,000 mg 200 mL/hr over 60 Minutes 12/22/16 1430   12/22/16 1200  piperacillin-tazobactam (ZOSYN) IVPB 3.375 g     3.375 g 100 mL/hr over 30 Minutes 12/22/16 1330       Devices    LINES / TUBES:      Continuous Infusions: . feeding  supplement (JEVITY 1.2 CAL) 1,000 mL (01/01/17 1054)  . piperacillin-tazobactam (ZOSYN)  IV 3.375 g (01/02/17 0558)     Objective: Vitals:   01/01/17 2310 01/02/17 0103 01/02/17 0318 01/02/17 0750  BP:    119/68  Pulse: 100  89 93  Resp: (!) 32  (!) 24 (!) 30  Temp:    99.3 F (37.4 C)  TempSrc:    Axillary  SpO2: 100% 98% 100% 98%  Weight:   154 lb 1.6 oz (69.9 kg)   Height:        Intake/Output Summary (Last 24 hours) at 01/02/17 0826 Last data filed at 01/02/17 0500  Gross per 24 hour  Intake             1350 ml  Output             1550 ml  Net             -200 ml   Filed Weights   12/31/16 0342 01/01/17 0432 01/02/17 0318  Weight: 153 lb 7 oz (69.6 kg) 153 lb 10.6 oz (69.7 kg) 154 lb 1.6 oz (69.9 kg)    Examination:  General: A/O 0, positive acute on chronic respiratory distress, cachectic Neck:  Negative scars, masses, torticollis, lymphadenopathy, JVD, #6 cuffed trach in place Lungs: Clear to auscultation bilaterally without wheezes or crackles Cardiovascular: Regular rate and rhythm without murmur gallop or rub normal S1 and S2 Abdomen: negative abdominal pain, nondistended, positive soft, bowel sounds, no rebound, no ascites, no appreciable mass Extremities: No significant cyanosis, clubbing, or edema bilateral lower extremities Skin: Stage IV sacral decubitus ulcer Psychiatric:  Unable to evaluate secondary to altered mental status (CVA) Central nervous system:  Patient appears to move right arm and wiggle toes to command. Unable to evaluate further secondary to CVA  .     Data Reviewed: Care during the described time interval was provided by me .  I have reviewed this patient's available data, including medical history, events of note, physical examination, and all test results as part of my evaluation.   CBC:  Recent Labs Lab 12/28/16 0406 12/30/16 0402 12/31/16 0342 01/01/17 0412  WBC 16.0* 21.2* 19.1* 15.7*  HGB 9.2* 9.8* 9.1* 8.7*  HCT 29.4*  32.1* 29.6* 28.1*  MCV 90.5 91.5 91.1 90.6  PLT 765* 870* 645* 034*   Basic Metabolic Panel:  Recent Labs Lab 12/28/16 0406 12/29/16 0331 12/30/16 0402 01/01/17 0412 01/02/17 0330  NA 134* 134* 133* 134* 135  K 4.1 3.9 4.2 4.0 4.2  CL 101 101 101 100* 101  CO2 '26 26 25 25 22  '$ GLUCOSE 92 114* 103* 103* 106*  BUN 25* 25* 23* 34* 33*  CREATININE 0.99 1.07 0.87 1.22 1.07  CALCIUM 10.0 10.0 10.4* 9.9 10.0  MG  --  1.8  --   --   --  GFR: Estimated Creatinine Clearance: 67.1 mL/min (by C-G formula based on SCr of 1.07 mg/dL). Liver Function Tests:  Recent Labs Lab 12/28/16 0406 12/29/16 0331 12/30/16 0402 01/01/17 0412  AST 16 14*  --  37  ALT 20 18  --  36  ALKPHOS 117 108  --  142*  BILITOT 0.4 0.2*  --  0.5  PROT 7.1 7.4 7.9 7.4  ALBUMIN 1.7* 1.8*  --  1.7*   No results for input(s): LIPASE, AMYLASE in the last 168 hours. No results for input(s): AMMONIA in the last 168 hours. Coagulation Profile:  Recent Labs Lab 01/02/17 0645  INR 0.99   Cardiac Enzymes: No results for input(s): CKTOTAL, CKMB, CKMBINDEX, TROPONINI in the last 168 hours. BNP (last 3 results) No results for input(s): PROBNP in the last 8760 hours. HbA1C: No results for input(s): HGBA1C in the last 72 hours. CBG:  Recent Labs Lab 01/01/17 1633 01/01/17 2023 01/01/17 2352 01/02/17 0334 01/02/17 0752  GLUCAP 134* 120* 134* 121* 101*   Lipid Profile: No results for input(s): CHOL, HDL, LDLCALC, TRIG, CHOLHDL, LDLDIRECT in the last 72 hours. Thyroid Function Tests: No results for input(s): TSH, T4TOTAL, FREET4, T3FREE, THYROIDAB in the last 72 hours. Anemia Panel: No results for input(s): VITAMINB12, FOLATE, FERRITIN, TIBC, IRON, RETICCTPCT in the last 72 hours. Urine analysis:    Component Value Date/Time   COLORURINE AMBER (A) 12/16/2016 1625   APPEARANCEUR CLEAR 12/16/2016 1625   LABSPEC 1.018 12/16/2016 1625   PHURINE 5.0 12/16/2016 1625   GLUCOSEU NEGATIVE 12/16/2016 1625     HGBUR NEGATIVE 12/16/2016 1625   BILIRUBINUR NEGATIVE 12/16/2016 1625   KETONESUR NEGATIVE 12/16/2016 1625   PROTEINUR 30 (A) 12/16/2016 1625   NITRITE NEGATIVE 12/16/2016 1625   LEUKOCYTESUR NEGATIVE 12/16/2016 1625   Sepsis Labs: '@LABRCNTIP'$ (procalcitonin:4,lacticidven:4)  )No results found for this or any previous visit (from the past 240 hour(s)).       Radiology Studies: Dg Chest Port 1 View  Result Date: 01/02/2017 CLINICAL DATA:  Tracheostomy.  Respiratory failure EXAM: PORTABLE CHEST 1 VIEW COMPARISON:  12/31/2016 FINDINGS: Tracheostomy remains in good position and unchanged Left lower lobe infiltrate unchanged. Possible loculated pleural effusion also unchanged. Mild right lower lobe airspace disease unchanged. Right lung otherwise clear. Negative for edema IMPRESSION: No interval change. Left lower lobe consolidation and probable loculated pleural effusion on the left unchanged. Electronically Signed   By: Franchot Gallo M.D.   On: 01/02/2017 07:33        Scheduled Meds: . amLODipine  10 mg Per Tube Daily  . aspirin  325 mg Per Tube Daily  . chlorhexidine gluconate (MEDLINE KIT)  15 mL Mouth Rinse BID  . cloNIDine  0.1 mg Per Tube TID  . clopidogrel  75 mg Per Tube Daily  . famotidine  20 mg Per Tube Q12H  . feeding supplement (PRO-STAT SUGAR FREE 64)  30 mL Per Tube TID  . free water  200 mL Per Tube Q6H  . gabapentin  200 mg Per Tube QHS  . [START ON 01/03/2017] heparin  5,000 Units Subcutaneous Q8H  . ipratropium-albuterol  3 mL Nebulization Q6H  . mouth rinse  15 mL Mouth Rinse QID  . metoprolol tartrate  10 mg Per Tube BID  . multivitamin with minerals  1 tablet Per Tube Daily  . pravastatin  20 mg Per Tube q1800  . thiamine  100 mg Per Tube Daily   Continuous Infusions: . feeding supplement (JEVITY 1.2 CAL) 1,000 mL (01/01/17  1054)  . piperacillin-tazobactam (ZOSYN)  IV 3.375 g (01/02/17 0558)     LOS: 11 days    Time spent: 40  minutes    Teila Skalsky, Geraldo Docker, MD [facility] Pager [phone]   If 7PM-7AM, please contact night-coverage [email or web address] Password [facility] 01/02/2017, 8:26 AM

## 2017-01-02 NOTE — Plan of Care (Signed)
Problem: Respiratory: Goal: Ability to maintain a clear airway and adequate ventilation will improve Outcome: Progressing Pt is doing well coughing up secretions and clearing them from his trach, however this RN has noted that his secretions are extremely thick and tenacious.

## 2017-01-02 NOTE — Progress Notes (Signed)
CSW continuing to follow for SNF placement if pt sister unable to organize patient coming home in time for [place], [place] Social Worker [phone]

## 2017-01-02 NOTE — Progress Notes (Signed)
Dr. Anselm Pancoast has discussed case with Dr. Titus Mould.  No procedure planned at this time.  Order has been cancelled.   Brynda Greathouse, MS RD PA-C 1:13 PM

## 2017-01-03 DIAGNOSIS — J90 Pleural effusion, not elsewhere classified: Secondary | ICD-10-CM

## 2017-01-03 LAB — GLUCOSE, CAPILLARY
Glucose-Capillary: 82 mg/dL (ref 65–99)
Glucose-Capillary: 77 mg/dL (ref 65–99)
Glucose-Capillary: 101 mg/dL — ABNORMAL HIGH (ref 65–99)
Glucose-Capillary: 117 mg/dL — ABNORMAL HIGH (ref 65–99)
Glucose-Capillary: 106 mg/dL — ABNORMAL HIGH (ref 65–99)
Glucose-Capillary: 95 mg/dL (ref 65–99)

## 2017-01-03 MED ORDER — JEVITY 1.2 CAL PO LIQD
1000.0000 mL | ORAL | Status: DC
  Administered 2017-01-04 – 2017-01-08 (×5): 1000 mL
  Filled 2017-01-03 (×10): qty 1000

## 2017-01-03 NOTE — Progress Notes (Signed)
[place] TEAM 1 - Stepdown/ICU TEAM  Scott Rivera  WUJ:[id number] DOB: Mar 17, 1951 DOA: 12/22/2016 PCP: Scott Rivera, No Pcp Per    Brief Narrative:  66 y/o M who presented with acute hypoxic respiratory failure after he was found with increased secretions and thick yellow sputum.  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   He was admitted 8/25 > 9/6 for sepsis in the setting of aspiration PNA w/ AKI.       In the ED a CXR showed LLL mucus plugging with cut off sign and airspace disease.  Attempts at suctioning the Scott Rivera were made without improvement in saturations.  Uncuffed trach was changed to a cuffed and the Scott Rivera was placed on mechanical ventilation.  Significant Events: 9/06  Discharge after admit with aspiration PNA, AKI 9/07  Re-admit with acute hypoxic respiratory failure due to mucus plugging  9/12 [facility] assumed care   Subjective: IR initially suggested attempt at US thoracentesis for complex L effusion.  Bedside US revealed "very small amount of effusion" and decision was made that CT guided chest tube would be more appropriate.  This was scheduled for 9/18, but after discussion between PCCM and IR apparently the decision was made to cancel this procedure as well.  I have spoken w/ Dr. Lake Bells today, and he and I both agree there is little to be gained by attempting to drain this loculated effusion.  I have canceled all orders to that affect and we will monitor clinically.  This was discussed w/ the Scott Rivera's sister at bedside.    The pt appears stable at present w/ no evidence of respiratory distress or uncontrolled pain.   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 total of abx tx - trach care per PCCM - appears to be stabilizing on trach collar alone presently -  wean O2 as able   Large L loculated pleural effusion  Progressive since admit - agree pt is not appropriate for VATS - see discussion above - decision has been made to monitor this for now w/ no attempts at intervention - stop abx today and follow clinically   HTN BP controlled   CKD Stage 3 crt was climbing but improved w/ gently volume expansion   Recent Labs Lab 12/28/16 0406 12/29/16 0331 12/30/16 0402 01/01/17 0412 01/02/17 0330  CREATININE 0.99 1.07 0.87 1.22 1.07    Dysphagia - Chronic PEG tube - Severe protein malnutrition Cont tube feeds via PEG   Anemia of critical illness and chronic disease Has been transfused 1U PRBC this admit - no evidence of blood loss - suspect anemia due to recurring critical illness, poor nutritional status, and CKD - Hgb stable at this time  Recent Labs Lab 12/28/16 0406 12/30/16 0402 12/31/16 0342 01/01/17 0412  HGB 9.2* 9.8* 9.1* 8.7*    Hx of cerebellar CVA with aphasia This has been a devastating event w/ no measurable recovery noted apart from pt regaining consciousness - Palliative Care has met with his sister again, who re-affirms her unwavering desire to continue full aggressive medical care   Stage 4 sacral wound present prior to admission - cont wound care as per WOC suggestions   DVT prophylaxis: SQ heparin  Code Status: FULL CODE Family Communication: no family present at time of exam  today Disposition Plan: SDU - will need to return to a SNF at d/c, but his sister is contemplating taking him home w/ her -  Consultants:  PCCM  Antimicrobials:  Vanco 9/7 > 9/12 Zosyn 9/7 > 9/18  Objective: Blood pressure (!) 151/87, pulse 81, temperature (!) 97.2 F (36.2 C), temperature source Oral, resp. rate (!) 30, height 5' 11"  (1.803 m), weight 68.5 kg (151 lb 0.2 oz), SpO2 97 %.  Intake/Output Summary (Last 24 hours) at 01/03/17 1203 Last data filed at 01/03/17 0816  Gross per 24 hour  Intake             1360 ml    Output             2350 ml  Net             -990 ml   Filed Weights   01/01/17 0432 01/02/17 0318 01/03/17 0347  Weight: 69.7 kg (153 lb 10.6 oz) 69.9 kg (154 lb 1.6 oz) 68.5 kg (151 lb 0.2 oz)    Examination: General: No acute respiratory distress in bed on T collar  Lungs: poor air movement in L base w/o change - no wheezing  Cardiovascular: RRR w/o M  Abdomen: Nondistended, soft, bowel sounds positive Extremities: No C/C/E B LE   CBC:  Recent Labs Lab 12/28/16 0406 12/30/16 0402 12/31/16 0342 01/01/17 0412  WBC 16.0* 21.2* 19.1* 15.7*  HGB 9.2* 9.8* 9.1* 8.7*  HCT 29.4* 32.1* 29.6* 28.1*  MCV 90.5 91.5 91.1 90.6  PLT 765* 870* 645* 151*   Basic Metabolic Panel:  Recent Labs Lab 12/28/16 0406 12/29/16 0331 12/30/16 0402 01/01/17 0412 01/02/17 0330  NA 134* 134* 133* 134* 135  K 4.1 3.9 4.2 4.0 4.2  CL 101 101 101 100* 101  CO2 26 26 25 25 22   GLUCOSE 92 114* 103* 103* 106*  BUN 25* 25* 23* 34* 33*  CREATININE 0.99 1.07 0.87 1.22 1.07  CALCIUM 10.0 10.0 10.4* 9.9 10.0  MG  --  1.8  --   --   --    GFR: Estimated Creatinine Clearance: 65.8 mL/min (by C-G formula based on SCr of 1.07 mg/dL).  Liver Function Tests:  Recent Labs Lab 12/28/16 0406 12/29/16 0331 12/30/16 0402 01/01/17 0412  AST 16 14*  --  37  ALT 20 18  --  36  ALKPHOS 117 108  --  142*  BILITOT 0.4 0.2*  --  0.5  PROT 7.1 7.4 7.9 7.4  ALBUMIN 1.7* 1.8*  --  1.7*    HbA1C: Hgb A1c MFr Bld  Date/Time Value Ref Range Status  12/09/2016 02:19 PM 5.7 (H) 4.8 - 5.6 % Final    Comment:    (NOTE) Pre diabetes:          5.7%-6.4% Diabetes:              >6.4% Glycemic control for   <7.0% adults with diabetes   10/21/2016 02:53 AM 5.4 4.8 - 5.6 % Final    Comment:    (NOTE)         Pre-diabetes: 5.7 - 6.4         Diabetes: >6.4         Glycemic control for adults with diabetes: <7.0     CBG:  Recent Labs Lab 01/02/17 1602 01/02/17 1938 01/02/17 2318 01/03/17 0325  01/03/17 0810  GLUCAP 130* 134* 112* 82 77    Scheduled Meds: . amLODipine  10 mg Per Tube  Daily  . aspirin  325 mg Per Tube Daily  . chlorhexidine gluconate (MEDLINE KIT)  15 mL Mouth Rinse BID  . cloNIDine  0.1 mg Per Tube TID  . clopidogrel  75 mg Per Tube Daily  . famotidine  20 mg Per Tube Q12H  . feeding supplement (PRO-STAT SUGAR FREE 64)  30 mL Per Tube TID  . free water  200 mL Per Tube Q6H  . gabapentin  200 mg Per Tube QHS  . ipratropium-albuterol  3 mL Nebulization Q6H  . mouth rinse  15 mL Mouth Rinse QID  . metoprolol tartrate  10 mg Per Tube BID  . multivitamin with minerals  1 tablet Per Tube Daily  . pravastatin  20 mg Per Tube q1800  . thiamine  100 mg Per Tube Daily     LOS: 12 days   Cherene Altes, MD [facility] Office  [phone] Pager - Text Page per Amion as per below:  On-Call/Text Page:      [email or web address]      password [email or web address]  If 7PM-7AM, please contact night-coverage [email or web address] Password [email or web address] 01/03/2017, 12:03 PM

## 2017-01-03 NOTE — Progress Notes (Signed)
PULMONARY / CRITICAL CARE MEDICINE   Name: Scott Rivera MRN: [id number] DOB: [birth year]    ADMISSION DATE:  12/22/2016 CONSULTATION DATE:  12/22/16  REFERRING MD:  Dr. Roderic Palau / EDP   CHIEF COMPLAINT:  Low O2 saturations   HISTORY OF PRESENT ILLNESS:   66 y/o M who presented with acute hypoxic respiratory failure.  Baseline nonverbal, trach / PEG and lives in a SNF since July 2018 after CVA.   Recent admit from 8/25-9/6 for sepsis in the setting of aspiration PNA, AKI. He was discharged 9/6 to a SNF for care.  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      The patient returns 9/7 with reports of low oxygen saturations (60's) at the SNF.  He was reportedly found with increased secretions, thick yellow sputum and tachycardia.  On arrival to the ER, the patient vomited on the staff.  Initial labs - Na 137, K 4.4, Cl 100, CO2 26, glucose 128, BUN 33, Sr Cr 1.10, alk phos 171, albumin 2.0, AST 25, ALT 39, T. Bilirubin 0.7, lactic acid 1.31, WBC 24.9, Hgb 10 and platelets 841.  CXR showed LLL mucus plugging with cut off sign and airspace disease.  Attempts at suctioning the patient were made without improvement in saturations.  Uncuffed trach was changed to a cuffed and the patient was placed on mechanical ventilation.  PCCM called for ICU admission.   SUBJECTIVE:   No acute events.  Chest tube on hold as of 9/19  VITAL SIGNS: BP 117/63   Pulse 81   Temp (!) 97.2 F (36.2 C) (Oral)   Resp (!) 26   Ht 5' 11"  (1.803 m)   Wt 151 lb 0.2 oz (68.5 kg)   SpO2 95%   BMI 21.06 kg/m   VENTILATOR SETTINGS: FiO2 (%):  [28 %-35 %] 35 %  INTAKE / OUTPUT:  Intake/Output Summary (Last 24 hours) at 01/03/17 1112 Last data filed at 01/03/17 0816  Gross per 24 hour  Intake             1360 ml  Output             2350 ml  Net             -990 ml      PHYSICAL EXAMINATION: General:  Frail male HEENT: Trach wit copious thick secretions  PSY: dull affect Neuro: not following commands CV: hsr PULM: coarse rhonchi bilaterally  NT:ZGYF, non-tender, bsx4 active  Extremities: warm/dry- edema  Skin: no rashes or lesions  LABS:  BMET  Recent Labs Lab 12/30/16 0402 01/01/17 0412 01/02/17 0330  NA 133* 134* 135  K 4.2 4.0 4.2  CL 101 100* 101  CO2 25 25 22   BUN 23* 34* 33*  CREATININE 0.87 1.22 1.07  GLUCOSE 103* 103* 106*    Electrolytes  Recent Labs Lab 12/29/16 0331 12/30/16 0402 01/01/17 0412 01/02/17 0330  CALCIUM 10.0 10.4* 9.9 10.0  MG 1.8  --   --   --     CBC  Recent Labs Lab 12/30/16 0402 12/31/16 0342 01/01/17 0412  WBC 21.2* 19.1* 15.7*  HGB 9.8* 9.1* 8.7*  HCT 32.1* 29.6* 28.1*  PLT 870* 645* 750*    Coag's  Recent Labs Lab 01/02/17 0645  APTT 59*  INR 0.99    Sepsis Markers  Recent Labs Lab 12/29/16  0331  LATICACIDVEN 1.3    ABG No results for input(s): PHART, PCO2ART, PO2ART in the last 168 hours.  Liver Enzymes  Recent Labs Lab 12/28/16 0406 12/29/16 0331 01/01/17 0412  AST 16 14* 37  ALT 20 18 36  ALKPHOS 117 108 142*  BILITOT 0.4 0.2* 0.5  ALBUMIN 1.7* 1.8* 1.7*    Cardiac Enzymes No results for input(s): TROPONINI, PROBNP in the last 168 hours.  Glucose  Recent Labs Lab 01/02/17 0752 01/02/17 1602 01/02/17 1938 01/02/17 2318 01/03/17 0325 01/03/17 0810  GLUCAP 101* 130* 134* 112* 82 77    Imaging No results found.   STUDIES:  CT Chest 9/14 >> LLL consolidation suspicious for PNA, superimposed moderate layering / partially loculated left pleural effusion increased since July, right atelectasis, trach in good position  CULTURES: BCx2 9/7 >> negative   Tracheal Aspirate 9/7 >> (not sent)   ANTIBIOTICS: Vanco 9/7 >> 9/13 Zosyn 9/7 >> 9/13  SIGNIFICANT EVENTS: 9/06  Discharge after admit with aspiration PNA, AKI 9/07  Re-admit with  acute hypoxic respiratory failure  9//19 ct guided chest tube on hold.  LINES/TUBES: Trach (cuffed changed) 9/7 >>  DISCUSSION: 66 y/o M with hx of severe cerebellar CVA / non-verbal status, SNF resident with trach / PEG readmitted 9/7 (after d/c 9/6) for worsening of hypoxic respiratory failure.  CXR concerning for increased L sided airspace disease and mucus plugging.  Off vent but now has large left loculated effusion.  CT chest demonstrates consolidation / partially loculated effusion 9/14.      ASSESSMENT / PLAN:  Acute on chronic respiratory failure. Aspiration pneumonia with mucus plugging (NOS) Large left loculated pleural effusion  Tracheostomy status. P: Continue ATC as tolerated  Patient is not a candidate for VATS  Duoneb Q6 IR consulted, recommending thoracentesis under US guidance prior to chest tube, 9/19 currently on hold per discussion of Dr. Titus Mould and IR. Further follow up this week. Zosyn stop date 9/18 for total 10 days Follow intermittent CXR   Hx of HTN. CKD 3.  At risk for fluid and electrolyte imbalance  Dysphagia.  Anemia of critical illness and chronic disease. 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   Richardson Landry [name] ACNP Maryanna Shape PCCM Pager [phone] till 3 pm If no answer page [phone] 01/03/2017, 11:12 AM

## 2017-01-04 DIAGNOSIS — I63443 Cerebral infarction due to embolism of bilateral cerebellar arteries: Secondary | ICD-10-CM

## 2017-01-04 LAB — BASIC METABOLIC PANEL
Anion gap: 7 (ref 5–15)
BUN: 31 mg/dL — ABNORMAL HIGH (ref 6–20)
CO2: 26 mmol/L (ref 22–32)
Calcium: 10.5 mg/dL — ABNORMAL HIGH (ref 8.9–10.3)
Chloride: 102 mmol/L (ref 101–111)
Creatinine, Ser: 0.85 mg/dL (ref 0.61–1.24)
GFR calc Af Amer: >60 mL/min (ref >60)
GFR calc non Af Amer: >60 mL/min (ref >60)
Glucose, Bld: 103 mg/dL — ABNORMAL HIGH (ref 65–99)
Potassium: 4.2 mmol/L (ref 3.5–5.1)
Sodium: 135 mmol/L (ref 135–145)

## 2017-01-04 LAB — GLUCOSE, CAPILLARY
Glucose-Capillary: 108 mg/dL — ABNORMAL HIGH (ref 65–99)
Glucose-Capillary: 110 mg/dL — ABNORMAL HIGH (ref 65–99)
Glucose-Capillary: 115 mg/dL — ABNORMAL HIGH (ref 65–99)
Glucose-Capillary: 111 mg/dL — ABNORMAL HIGH (ref 65–99)
Glucose-Capillary: 122 mg/dL — ABNORMAL HIGH (ref 65–99)

## 2017-01-04 LAB — CBC
HCT: 28.4 % — ABNORMAL LOW (ref 39.0–52.0)
Hemoglobin: 8.6 g/dL — ABNORMAL LOW (ref 13.0–17.0)
MCH: 27.9 pg (ref 26.0–34.0)
MCHC: 30.3 g/dL (ref 30.0–36.0)
MCV: 92.2 fL (ref 78.0–100.0)
Platelets: 732 10*3/uL — ABNORMAL HIGH (ref 150–400)
RBC: 3.08 MIL/uL — ABNORMAL LOW (ref 4.22–5.81)
RDW: 16.4 % — ABNORMAL HIGH (ref 11.5–15.5)
WBC: 14.8 10*3/uL — ABNORMAL HIGH (ref 4.0–10.5)

## 2017-01-04 MED ORDER — IPRATROPIUM-ALBUTEROL 0.5-2.5 (3) MG/3ML IN SOLN: 3.0000 mL | RESPIRATORY_TRACT | Status: DC | PRN
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
## 2017-01-04 NOTE — Care Management Important Message (Signed)
Important Message  Patient Details  Name: Scott Rivera MRN: [id number] Date of Birth: 1950/10/30   Medicare Important Message Given:  Yes    Nathen May 01/04/2017, 9:46 AM

## 2017-01-04 NOTE — Progress Notes (Signed)
PROGRESS NOTE    KALIK HOARE  ZOX:[id number] DOB: 12-04-1950 DOA: 12/22/2016 PCP: Patient, No Pcp Per   Brief Narrative:  66 y/o M  PMHx  Severe Cerebellar CVA, HTN, CKD stage III, anemia due to CKD,   Presented with acute hypoxic respiratory failure after he was found with increased secretions and thick yellow sputum.  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    He was admitted 8/25 > 9/6 for sepsis in the setting of aspiration PNA w/ AKI.        In the ED a CXR showed LLL mucus plugging with cut off sign and airspace disease.  Attempts at suctioning the patient were made without improvement in saturations.  Uncuffed trach was changed to a cuffed and the patient was placed on mechanical ventilation.    Subjective: 9/20 A/O 0 patient opens eyes, wiggle his toes to command, when asked if he would raise his arm patient shook his head no    Assessment & Plan:   Active Problems:   Cerebellar infarct [facility])   Essential hypertension   Pressure injury of skin   Dysphagia, post-stroke   PNA (pneumonia)   Loculated pleural effusion  Acute on chronic respiratory failure with hypoxia -Complete 10 day course antibiotics per [facility] M  LEFT loculated Pleural Effusion -Patient not appropriate for VATS -Per Dr. Garen Lah note 9/19 after discussion with [facility] M IR guided thoracentesis canceled  Essential HTN   -Well controlled -Amlodipine 10 mg daily -Clonidine 0.1 mg TID -Metoprolol 10 mg BID   CKD stage III     Recent Labs Lab 12/29/16 0331 12/30/16 0402 01/01/17 0412 01/02/17 0330 01/04/17 0320  CREATININE 1.07 0.87 1.22 1.07 0.85  -baseline     Dysphagia -Chronic PEG. Tube  Severe protein calorie malnutrition  -continue tube feeds  Anemia of critical illness/chronic disease  -Has been transfused 1U PRBC this admit - no evidence of blood loss - suspect anemia due to recurring critical  illness, poor nutritional status, and CKD     Recent Labs Lab 12/30/16 0402 12/31/16 0342 01/01/17 0412 01/04/17 0320  HGB 9.8* 9.1* 8.7* 8.6*  -Stable    Cerebellar CVA with aphasia   -This has been a devastating event w/ no measurable recovery noted apart from pt regaining consciousness  -PALLIATIVE care met with sister who again states continue full aggressive medical care.     Stage IV sacral decubitus ulcer -Present prior to admission -Continue care per [facility] suggestions     DVT prophylaxis: SCD/subcutaneous heparin Code Status: Full Family Communication: None Disposition Plan: SNF   Consultants:  [facility] M    Procedures/Significant Events:  9/06  Discharge after admit with aspiration PNA, AKI 9/07  Re-admit with acute hypoxic respiratory failure due to mucus plugging  9/12 [facility] assumed care     I have personally reviewed and interpreted all radiology studies and my findings are as above.  VENTILATOR SETTINGS: TC    Cultures   Antimicrobials: Anti-infectives    Start     Dose/Rate Stop   12/30/16 1400  piperacillin-tazobactam (ZOSYN) IVPB 3.375 g     3.375 g 12.5 mL/hr over 240 Minutes 01/02/17 1855   12/26/16 0100  vancomycin (VANCOCIN) 500 mg in sodium chloride 0.9 % 100 mL IVPB     500 mg 100 mL/hr over 60 Minutes 12/28/16 0344   12/22/16 2200  vancomycin (VANCOCIN) IVPB  750 mg/150 ml premix  Status:  Discontinued     750 mg 150 mL/hr over 60 Minutes 12/25/16 2236   12/22/16 1800  piperacillin-tazobactam (ZOSYN) IVPB 3.375 g  Status:  Discontinued     3.375 g 12.5 mL/hr over 240 Minutes 12/28/16 1934   12/22/16 1200  vancomycin (VANCOCIN) IVPB 1000 mg/200 mL premix     1,000 mg 200 mL/hr over 60 Minutes 12/22/16 1430   12/22/16 1200  piperacillin-tazobactam (ZOSYN) IVPB 3.375 g     3.375 g 100 mL/hr over 30 Minutes 12/22/16 1330       Devices    LINES / TUBES:      Continuous Infusions: . feeding supplement (JEVITY 1.2 CAL) 1,000 mL  (01/04/17 0817)     Objective: Vitals:   01/04/17 0353 01/04/17 0400 01/04/17 0753 01/04/17 0807  BP:  112/70 118/60   Pulse:  75 80 79  Resp:  (!) 26 (!) 26 (!) 28  Temp:   98.3 F (36.8 C)   TempSrc:   Oral   SpO2:  98% 97% 96%  Weight: 154 lb 8.7 oz (70.1 kg)     Height:        Intake/Output Summary (Last 24 hours) at 01/04/17 8115 Last data filed at 01/04/17 0400  Gross per 24 hour  Intake          1066.67 ml  Output             1400 ml  Net          -333.33 ml   Filed Weights   01/02/17 0318 01/03/17 0347 01/04/17 0353  Weight: 154 lb 1.6 oz (69.9 kg) 151 lb 0.2 oz (68.5 kg) 154 lb 8.7 oz (70.1 kg)   Physical Exam:  General: A/O 0, follows some commands positive acute on chronic respiratory distress, cachectic Neck:  Negative scars, masses, torticollis, lymphadenopathy, JVD #6 cuffed trach in place Lungs: Clear to auscultation bilaterally without wheezes or crackles Cardiovascular: Regular rate and rhythm without murmur gallop or rub normal S1 and S2 Abdomen: negative abdominal pain, nondistended, positive soft, bowel sounds, no rebound, no ascites, no appreciable mass, PEG tube present negative sign of infection Extremities: No significant cyanosis, clubbing, or edema bilateral lower extremities Skin: Stage IV sacral decubitus ulcer Psychiatric:  Unable to evaluate secondary to altered mental status (CVA)  Central nervous system:  Eyes open, wiggles toes to command, refuses to raise his hand to command   .     Data Reviewed: Care during the described time interval was provided by me .  I have reviewed this patient's available data, including medical history, events of note, physical examination, and all test results as part of my evaluation.   CBC:  Recent Labs Lab 12/30/16 0402 12/31/16 0342 01/01/17 0412 01/04/17 0320  WBC 21.2* 19.1* 15.7* 14.8*  HGB 9.8* 9.1* 8.7* 8.6*  HCT 32.1* 29.6* 28.1* 28.4*  MCV 91.5 91.1 90.6 92.2  PLT 870* 645* 750* 732*     Basic Metabolic Panel:  Recent Labs Lab 12/29/16 0331 12/30/16 0402 01/01/17 0412 01/02/17 0330 01/04/17 0320  NA 134* 133* 134* 135 135  K 3.9 4.2 4.0 4.2 4.2  CL 101 101 100* 101 102  CO2 26 25 25 22 26   GLUCOSE 114* 103* 103* 106* 103*  BUN 25* 23* 34* 33* 31*  CREATININE 1.07 0.87 1.22 1.07 0.85  CALCIUM 10.0 10.4* 9.9 10.0 10.5*  MG 1.8  --   --   --   --  GFR: Estimated Creatinine Clearance: 84.8 mL/min (by C-G formula based on SCr of 0.85 mg/dL). Liver Function Tests:  Recent Labs Lab 12/29/16 0331 12/30/16 0402 01/01/17 0412  AST 14*  --  37  ALT 18  --  36  ALKPHOS 108  --  142*  BILITOT 0.2*  --  0.5  PROT 7.4 7.9 7.4  ALBUMIN 1.8*  --  1.7*   No results for input(s): LIPASE, AMYLASE in the last 168 hours. No results for input(s): AMMONIA in the last 168 hours. Coagulation Profile:  Recent Labs Lab 01/02/17 0645  INR 0.99   Cardiac Enzymes: No results for input(s): CKTOTAL, CKMB, CKMBINDEX, TROPONINI in the last 168 hours. BNP (last 3 results) No results for input(s): PROBNP in the last 8760 hours. HbA1C: No results for input(s): HGBA1C in the last 72 hours. CBG:  Recent Labs Lab 01/03/17 1618 01/03/17 1937 01/03/17 2327 01/04/17 0346 01/04/17 0752  GLUCAP 117* 106* 95 108* 110*   Lipid Profile: No results for input(s): CHOL, HDL, LDLCALC, TRIG, CHOLHDL, LDLDIRECT in the last 72 hours. Thyroid Function Tests: No results for input(s): TSH, T4TOTAL, FREET4, T3FREE, THYROIDAB in the last 72 hours. Anemia Panel: No results for input(s): VITAMINB12, FOLATE, FERRITIN, TIBC, IRON, RETICCTPCT in the last 72 hours. Urine analysis:    Component Value Date/Time   COLORURINE AMBER (A) 12/16/2016 1625   APPEARANCEUR CLEAR 12/16/2016 1625   LABSPEC 1.018 12/16/2016 1625   PHURINE 5.0 12/16/2016 1625   GLUCOSEU NEGATIVE 12/16/2016 1625   HGBUR NEGATIVE 12/16/2016 1625   BILIRUBINUR NEGATIVE 12/16/2016 1625   KETONESUR NEGATIVE 12/16/2016  1625   PROTEINUR 30 (A) 12/16/2016 1625   NITRITE NEGATIVE 12/16/2016 1625   LEUKOCYTESUR NEGATIVE 12/16/2016 1625   Sepsis Labs: @LABRCNTIP (procalcitonin:4,lacticidven:4)  )No results found for this or any previous visit (from the past 240 hour(s)).       Radiology Studies: No results found.      Scheduled Meds: . amLODipine  10 mg Per Tube Daily  . aspirin  325 mg Per Tube Daily  . chlorhexidine gluconate (MEDLINE KIT)  15 mL Mouth Rinse BID  . cloNIDine  0.1 mg Per Tube TID  . clopidogrel  75 mg Per Tube Daily  . famotidine  20 mg Per Tube Q12H  . feeding supplement (PRO-STAT SUGAR FREE 64)  30 mL Per Tube TID  . free water  200 mL Per Tube Q6H  . gabapentin  200 mg Per Tube QHS  . ipratropium-albuterol  3 mL Nebulization Q6H  . mouth rinse  15 mL Mouth Rinse QID  . metoprolol tartrate  10 mg Per Tube BID  . multivitamin with minerals  1 tablet Per Tube Daily  . pravastatin  20 mg Per Tube q1800  . thiamine  100 mg Per Tube Daily   Continuous Infusions: . feeding supplement (JEVITY 1.2 CAL) 1,000 mL (01/04/17 0817)     LOS: 13 days    Time spent: 40 minutes    Rylei Masella, Geraldo Docker, MD [facility] Pager [phone]   If 7PM-7AM, please contact night-coverage [email or web address] Password [email or web address] 01/04/2017, 8:52 AM

## 2017-01-04 NOTE — Progress Notes (Signed)
Pt will need trach changed to cuffless prior to discharge. RN aware. RT will continue to monitor

## 2017-01-05 ENCOUNTER — Inpatient Hospital Stay (HOSPITAL_COMMUNITY): Payer: Medicare HMO

## 2017-01-05 ENCOUNTER — Encounter (HOSPITAL_COMMUNITY): Payer: Self-pay | Admitting: Radiology

## 2017-01-05 DIAGNOSIS — R1312 Dysphagia, oropharyngeal phase: Secondary | ICD-10-CM

## 2017-01-05 DIAGNOSIS — D72828 Other elevated white blood cell count: Secondary | ICD-10-CM

## 2017-01-05 HISTORY — PX: IR PATIENT EVAL TECH 0-60 MINS: IMG5564

## 2017-01-05 LAB — GLUCOSE, CAPILLARY
Glucose-Capillary: 101 mg/dL — ABNORMAL HIGH (ref 65–99)
Glucose-Capillary: 107 mg/dL — ABNORMAL HIGH (ref 65–99)
Glucose-Capillary: 108 mg/dL — ABNORMAL HIGH (ref 65–99)
Glucose-Capillary: 128 mg/dL — ABNORMAL HIGH (ref 65–99)
Glucose-Capillary: 136 mg/dL — ABNORMAL HIGH (ref 65–99)
Glucose-Capillary: 88 mg/dL (ref 65–99)
Glucose-Capillary: 96 mg/dL (ref 65–99)

## 2017-01-05 LAB — CBC WITH DIFFERENTIAL/PLATELET
Basophils Absolute: 0 10*3/uL (ref 0.0–0.1)
Basophils Relative: 0 %
Eosinophils Absolute: 0.6 10*3/uL (ref 0.0–0.7)
Eosinophils Relative: 3 %
HCT: 33.3 % — ABNORMAL LOW (ref 39.0–52.0)
Hemoglobin: 10 g/dL — ABNORMAL LOW (ref 13.0–17.0)
Lymphocytes Relative: 16 %
Lymphs Abs: 2.7 10*3/uL (ref 0.7–4.0)
MCH: 27.8 pg (ref 26.0–34.0)
MCHC: 30 g/dL (ref 30.0–36.0)
MCV: 92.5 fL (ref 78.0–100.0)
Monocytes Absolute: 1.2 10*3/uL — ABNORMAL HIGH (ref 0.1–1.0)
Monocytes Relative: 7 %
Neutro Abs: 12.1 10*3/uL — ABNORMAL HIGH (ref 1.7–7.7)
Neutrophils Relative %: 74 %
Platelets: 842 10*3/uL — ABNORMAL HIGH (ref 150–400)
RBC: 3.6 MIL/uL — ABNORMAL LOW (ref 4.22–5.81)
RDW: 16.3 % — ABNORMAL HIGH (ref 11.5–15.5)
WBC: 16.6 10*3/uL — ABNORMAL HIGH (ref 4.0–10.5)

## 2017-01-05 LAB — BASIC METABOLIC PANEL
Anion gap: 7 (ref 5–15)
BUN: 34 mg/dL — ABNORMAL HIGH (ref 6–20)
CO2: 27 mmol/L (ref 22–32)
Calcium: 10.8 mg/dL — ABNORMAL HIGH (ref 8.9–10.3)
Chloride: 103 mmol/L (ref 101–111)
Creatinine, Ser: 0.9 mg/dL (ref 0.61–1.24)
GFR calc Af Amer: >60 mL/min (ref >60)
GFR calc non Af Amer: >60 mL/min (ref >60)
Glucose, Bld: 111 mg/dL — ABNORMAL HIGH (ref 65–99)
Potassium: 4.5 mmol/L (ref 3.5–5.1)
Sodium: 137 mmol/L (ref 135–145)

## 2017-01-05 LAB — MAGNESIUM: Magnesium: 2 mg/dL (ref 1.7–2.4)

## 2017-01-05 NOTE — Procedures (Signed)
Unclogged gastrostomy tube using short 75cm amplatz wire and declogger. Gastrostomy tube flushed with ease.  Tube is good to use.

## 2017-01-05 NOTE — NC FL2 (Signed)
[place] MEDICAID [id number] LEVEL OF CARE SCREENING TOOL     IDENTIFICATION  Patient Name: Scott Rivera Birthdate: Mar 26, 1951 Sex: male Admission Date (Current Location): 12/22/2016  [facility] and [place] Number:  Producer, television/film/video and Address:  [facility], [address], [place], [place] [address]      Provider Number: [id number]  Attending Physician Name and Address:  Drema Dallas, MD  Relative Name and Phone Number:       Current Level of Care: Hospital Recommended Level of Care: Skilled Nursing Facility Prior Approval Number:    Date Approved/Denied:   PASRR Number:    Discharge Plan: SNF    Current Diagnoses: Patient Active Problem List   Diagnosis Date Noted  . Loculated pleural effusion   . PNA (pneumonia) 12/22/2016  . Acute respiratory distress   . Fever   . Follow up   . Healthcare-associated pneumonia   . Stage III pressure ulcer of sacral region (HCC) 12/17/2016  . Hypoxemia   . Tracheostomy status (HCC)   . Pneumothorax   . Pneumothorax, closed, traumatic, initial encounter 12/10/2016  . Sepsis (HCC) 12/09/2016  . Hypotension 12/09/2016  . Acute-on-chronic kidney injury (HCC) 12/09/2016  . Elevated troponin 12/09/2016  . Anemia due to chronic kidney disease 12/09/2016  . Compensated metabolic alkalosis 12/09/2016  . Aspiration pneumonia (HCC)   . Goals of care, counseling/discussion   . Palliative care by specialist   . Respiratory failure (HCC)   . Aspiration into airway   . Septic shock (HCC)   . Dysphagia, post-stroke   . Benign essential HTN   . Tobacco abuse   . Tachypnea   . Hyperglycemia   . Hypernatremia   . Leukocytosis (leucocytosis)   . Acute blood loss anemia   . Chronic kidney disease   . Pressure injury of skin 10/20/2016  . Cerebellar infarct (HCC) 10/19/2016  . ARF (acute renal failure) (HCC) 10/19/2016  . Essential hypertension 10/19/2016  . Rhabdomyolysis 10/19/2016  . Acute ischemic stroke  (HCC) 10/19/2016  . Bilateral chronic knee pain 01/07/2016  . History of adenomatous polyp of colon 07/28/2015  . Hyperlipidemia 07/28/2013  . Helicobacter pylori infection 09/21/2012    Orientation RESPIRATION BLADDER Height & Weight        Tracheostomy, O2 (28% FiO2, 46mm uncuffed shiley) Incontinent, External catheter Weight: 153 lb (69.4 kg) Height:  5\' 11"  (180.3 cm)  BEHAVIORAL SYMPTOMS/MOOD NEUROLOGICAL BOWEL NUTRITION STATUS      Incontinent  PEG tube  AMBULATORY STATUS COMMUNICATION OF NEEDS Skin   Extensive Assist   PU Stage and Appropriate Care   PU Stage 2 Dressing:  (located on buttocks/sacrum requiring foam dressing changes)                   Personal Care Assistance Level of Assistance  Bathing, Dressing, Feeding Bathing Assistance: Maximum assistance Feeding assistance: Maximum assistance Dressing Assistance: Maximum assistance     Functional Limitations Info             SPECIAL CARE FACTORS FREQUENCY  PT (By licensed PT), OT (By licensed OT)     PT Frequency: 5/wk OT Frequency: 5/wk            Contractures      Additional Factors Info  Code Status, Allergies, Suctioning Needs Code Status Info: FULL Allergies Info: NKA       Suctioning Needs: q4   Current Medications (01/05/2017):  This is the current hospital active medication  list Current Facility-Administered Medications  Medication Dose Route Frequency Provider Last Rate Last Dose  . acetaminophen (TYLENOL) tablet 650 mg  650 mg Per Tube Q6H PRN Cherene Altes, MD   650 mg at 12/31/16 0850  . amLODipine (NORVASC) tablet 10 mg  10 mg Per Tube Daily Cherene Altes, MD   10 mg at 01/05/17 0909  . aspirin tablet 325 mg  325 mg Per Tube Daily Ollis, Brandi L, NP   325 mg at 01/05/17 0909  . chlorhexidine gluconate (MEDLINE KIT) (PERIDEX) 0.12 % solution 15 mL  15 mL Mouth Rinse BID Collene Gobble, MD   15 mL at 01/05/17 0824  . cloNIDine (CATAPRES) tablet 0.1 mg  0.1 mg Per Tube  TID Cherene Altes, MD   0.1 mg at 01/05/17 0909  . clopidogrel (PLAVIX) tablet 75 mg  75 mg Per Tube Daily Ollis, Brandi L, NP   75 mg at 01/05/17 0909  . famotidine (PEPCID) 40 MG/5ML suspension 20 mg  20 mg Per Tube Q12H Chesley Mires, MD   20 mg at 01/05/17 0909  . feeding supplement (JEVITY 1.2 CAL) liquid 1,000 mL  1,000 mL Per Tube Continuous Cherene Altes, MD 50 mL/hr at 01/05/17 0554 1,000 mL at 01/05/17 0554  . feeding supplement (PRO-STAT SUGAR FREE 64) liquid 30 mL  30 mL Per Tube TID Chesley Mires, MD   30 mL at 01/05/17 0909  . fentaNYL (SUBLIMAZE) injection 25-50 mcg  25-50 mcg Intravenous Q1H PRN Joette Catching T, MD      . free water 200 mL  200 mL Per Tube Q6H Cherene Altes, MD   200 mL at 01/05/17 0600  . gabapentin (NEURONTIN) 250 MG/5ML solution 200 mg  200 mg Per Tube QHS Cherene Altes, MD   200 mg at 01/04/17 2147  . ipratropium-albuterol (DUONEB) 0.5-2.5 (3) MG/3ML nebulizer solution 3 mL  3 mL Nebulization Q4H PRN Allie Bossier, MD      . MEDLINE mouth rinse  15 mL Mouth Rinse QID Collene Gobble, MD   15 mL at 01/05/17 0501  . metoprolol tartrate (LOPRESSOR) 25 mg/10 mL oral suspension 10 mg  10 mg Per Tube BID Noe Gens L, NP   10 mg at 01/05/17 0909  . multivitamin with minerals tablet 1 tablet  1 tablet Per Tube Daily Noe Gens L, NP   1 tablet at 01/05/17 0909  . ondansetron (ZOFRAN) injection 4 mg  4 mg Intravenous Q6H PRN Ollis, Brandi L, NP      . pravastatin (PRAVACHOL) tablet 20 mg  20 mg Per Tube q1800 Ollis, Brandi L, NP   20 mg at 01/04/17 1725  . thiamine (VITAMIN B-1) tablet 100 mg  100 mg Per Tube Daily Ollis, Brandi L, NP   100 mg at 01/05/17 0909     Discharge Medications: Please see discharge summary for a list of discharge medications.  Relevant Imaging Results:  Relevant Lab Results:   Additional Information SS#: [id number]  Jorge Ny, LCSW

## 2017-01-05 NOTE — Progress Notes (Addendum)
PROGRESS NOTE    KIMSEY DEMAREE  KGY:[id number] DOB: 24-May-1950 DOA: 12/22/2016 PCP: Patient, No Pcp Per   Brief Narrative:  65 y/o M  PMHx  Severe Cerebellar CVA, HTN, CKD stage III, anemia due to CKD,   Presented with acute hypoxic respiratory failure after he was found with increased secretions and thick yellow sputum.  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    He was admitted 8/25 > 9/6 for sepsis in the setting of aspiration PNA w/ AKI.        In the ED a CXR showed LLL mucus plugging with cut off sign and airspace disease.  Attempts at suctioning the patient were made without improvement in saturations.  Uncuffed trach was changed to a cuffed and the patient was placed on mechanical ventilation.    Subjective: 9/21 A/O 0 follow some commands      Assessment & Plan:   Active Problems:   Cerebellar infarct [facility])   Essential hypertension   Pressure injury of skin   Dysphagia, post-stroke   PNA (pneumonia)   Loculated pleural effusion  Acute on chronic respiratory failure with hypoxia -Completed 10 day course antibiotics per [facility] M  LEFT loculated Pleural Effusion -Patient not appropriate for VATS -Per Dr. Garen Lah note 9/19 after discussion with PCCM IR guided thoracentesis canceled  Essential HTN   -Well controlled -Amlodipine 10 mg daily -Clonidine 0.1 mg TID -Metoprolol 10 mg BID   CKD stage III    Recent Labs Lab 12/30/16 0402 01/01/17 0412 01/02/17 0330 01/04/17 0320  CREATININE 0.87 1.22 1.07 0.85  -baseline     Dysphagia -Chronic PEG tube  Severe protein calorie malnutrition  -continue tube feeds  Anemia of critical illness/chronic disease  -9/10 transfused 1 unit PRBC -Negative evidence acute blood loss. -Anemia most likely secondary to recurring critical illness, poor attention status, and CKD    Recent Labs Lab 12/30/16 0402 12/31/16 0342 01/01/17 0412  01/04/17 0320  HGB 9.8* 9.1* 8.7* 8.6*  -Stable   Leukocytosis   Recent Labs Lab 12/30/16 0402 12/31/16 0342 01/01/17 0412 01/04/17 0320 01/05/17 0841  WBC 21.2* 19.1* 15.7* 14.8* 16.6*  -afebrile, negative bands, negative left shift. Don't believe this is true infection. -Most likely reactive, although patient has multiple sites of possible infection, to include sacral decubitus ulcer, loculated pleural effusion. -Monitor overnight   Cerebellar CVA with aphasia   -This has been a devastating event w/ no measurable recovery noted apart from pt regaining consciousness  -PALLIATIVE care met with sister who again states continue full aggressive medical care.     Stage IV sacral decubitus ulcer -Present prior to admission -Continue care per [place] suggestions     DVT prophylaxis: SCD/subcutaneous heparin Code Status: Full Family Communication: None Disposition Plan: SNF   Consultants:  [facility] M    Procedures/Significant Events:  9/06  Discharge after admit with aspiration PNA, AKI 9/07  Re-admit with acute hypoxic respiratory failure due to mucus plugging  9/10 transfused 1 unit PRBC 9/12 [facility] assumed care  9/21 change to #6 cuffless trach    I have personally reviewed and interpreted all radiology studies and my findings are as above.  VENTILATOR SETTINGS: TC FiO2: 28%    Cultures   Antimicrobials: Anti-infectives    Start     Dose/Rate Stop   12/30/16 1400  piperacillin-tazobactam (ZOSYN) IVPB 3.375 g     3.375  g 12.5 mL/hr over 240 Minutes 01/02/17 1855   12/26/16 0100  vancomycin (VANCOCIN) 500 mg in sodium chloride 0.9 % 100 mL IVPB     500 mg 100 mL/hr over 60 Minutes 12/28/16 0344   12/22/16 2200  vancomycin (VANCOCIN) IVPB 750 mg/150 ml premix  Status:  Discontinued     750 mg 150 mL/hr over 60 Minutes 12/25/16 2236   12/22/16 1800  piperacillin-tazobactam (ZOSYN) IVPB 3.375 g  Status:  Discontinued     3.375 g 12.5 mL/hr over 240 Minutes 12/28/16  1934   12/22/16 1200  vancomycin (VANCOCIN) IVPB 1000 mg/200 mL premix     1,000 mg 200 mL/hr over 60 Minutes 12/22/16 1430   12/22/16 1200  piperacillin-tazobactam (ZOSYN) IVPB 3.375 g     3.375 g 100 mL/hr over 30 Minutes 12/22/16 1330       Devices    LINES / TUBES:  #6 Cuffless trach 9/21>>    Continuous Infusions: . feeding supplement (JEVITY 1.2 CAL) 1,000 mL (01/05/17 0554)     Objective: Vitals:   01/05/17 0356 01/05/17 0400 01/05/17 0740 01/05/17 0750  BP:  131/65  (!) 161/76  Pulse: 77 71 80 79  Resp: (!) 28 (!) 25 (!) 27 (!) 25  Temp: 98.2 F (36.8 C)   98.5 F (36.9 C)  TempSrc: Oral   Oral  SpO2:  98% 96% 96%  Weight: 153 lb (69.4 kg)     Height:        Intake/Output Summary (Last 24 hours) at 01/05/17 0811 Last data filed at 01/05/17 0600  Gross per 24 hour  Intake             1700 ml  Output             1825 ml  Net             -125 ml   Filed Weights   01/03/17 0347 01/04/17 0353 01/05/17 0356  Weight: 151 lb 0.2 oz (68.5 kg) 154 lb 8.7 oz (70.1 kg) 153 lb (69.4 kg)   Physical Exam:  General: A/O 0, follow some commands, positive chronic respiratory distress Neck:  Negative scars, masses, torticollis, lymphadenopathy, JVD, #6 cuffless trach in place Lungs: RUL/RLL/LUL Clear to auscultation. Decreased breath sounds LLL, negative wheezes or crackles Cardiovascular: Regular rate and rhythm without murmur gallop or rub normal S1 and S2 Abdomen: negative abdominal pain, nondistended, positive soft, bowel sounds, no rebound, no ascites, no appreciable mass, PEG tube present negative sign of infection Extremities: No significant cyanosis, clubbing, or edema bilateral lower extremities Skin: Stage IV sacral decubitus ulcer Psychiatric:  Unable to evaluate secondary to altered mental status (CVA)  Central nervous system:  Eyes open, raises bilateral arms to command, will not move/wiggle feet command today .     Data Reviewed: Care during the  described time interval was provided by me .  I have reviewed this patient's available data, including medical history, events of note, physical examination, and all test results as part of my evaluation.   CBC:  Recent Labs Lab 12/30/16 0402 12/31/16 0342 01/01/17 0412 01/04/17 0320  WBC 21.2* 19.1* 15.7* 14.8*  HGB 9.8* 9.1* 8.7* 8.6*  HCT 32.1* 29.6* 28.1* 28.4*  MCV 91.5 91.1 90.6 92.2  PLT 870* 645* 750* 774*   Basic Metabolic Panel:  Recent Labs Lab 12/30/16 0402 01/01/17 0412 01/02/17 0330 01/04/17 0320  NA 133* 134* 135 135  K 4.2 4.0 4.2 4.2  CL 101 100* 101  102  CO2 25 25 22 26   GLUCOSE 103* 103* 106* 103*  BUN 23* 34* 33* 31*  CREATININE 0.87 1.22 1.07 0.85  CALCIUM 10.4* 9.9 10.0 10.5*   GFR: Estimated Creatinine Clearance: 83.9 mL/min (by C-G formula based on SCr of 0.85 mg/dL). Liver Function Tests:  Recent Labs Lab 12/30/16 0402 01/01/17 0412  AST  --  37  ALT  --  36  ALKPHOS  --  142*  BILITOT  --  0.5  PROT 7.9 7.4  ALBUMIN  --  1.7*   No results for input(s): LIPASE, AMYLASE in the last 168 hours. No results for input(s): AMMONIA in the last 168 hours. Coagulation Profile:  Recent Labs Lab 01/02/17 0645  INR 0.99   Cardiac Enzymes: No results for input(s): CKTOTAL, CKMB, CKMBINDEX, TROPONINI in the last 168 hours. BNP (last 3 results) No results for input(s): PROBNP in the last 8760 hours. HbA1C: No results for input(s): HGBA1C in the last 72 hours. CBG:  Recent Labs Lab 01/04/17 1622 01/04/17 2107 01/05/17 0004 01/05/17 0355 01/05/17 0749  GLUCAP 111* 122* 108* 96 101*   Lipid Profile: No results for input(s): CHOL, HDL, LDLCALC, TRIG, CHOLHDL, LDLDIRECT in the last 72 hours. Thyroid Function Tests: No results for input(s): TSH, T4TOTAL, FREET4, T3FREE, THYROIDAB in the last 72 hours. Anemia Panel: No results for input(s): VITAMINB12, FOLATE, FERRITIN, TIBC, IRON, RETICCTPCT in the last 72 hours. Urine analysis:      Component Value Date/Time   COLORURINE AMBER (A) 12/16/2016 1625   APPEARANCEUR CLEAR 12/16/2016 1625   LABSPEC 1.018 12/16/2016 1625   PHURINE 5.0 12/16/2016 1625   GLUCOSEU NEGATIVE 12/16/2016 1625   HGBUR NEGATIVE 12/16/2016 1625   BILIRUBINUR NEGATIVE 12/16/2016 1625   KETONESUR NEGATIVE 12/16/2016 1625   PROTEINUR 30 (A) 12/16/2016 1625   NITRITE NEGATIVE 12/16/2016 1625   LEUKOCYTESUR NEGATIVE 12/16/2016 1625   Sepsis Labs: @LABRCNTIP (procalcitonin:4,lacticidven:4)  )No results found for this or any previous visit (from the past 240 hour(s)).       Radiology Studies: No results found.      Scheduled Meds: . amLODipine  10 mg Per Tube Daily  . aspirin  325 mg Per Tube Daily  . chlorhexidine gluconate (MEDLINE KIT)  15 mL Mouth Rinse BID  . cloNIDine  0.1 mg Per Tube TID  . clopidogrel  75 mg Per Tube Daily  . famotidine  20 mg Per Tube Q12H  . feeding supplement (PRO-STAT SUGAR FREE 64)  30 mL Per Tube TID  . free water  200 mL Per Tube Q6H  . gabapentin  200 mg Per Tube QHS  . mouth rinse  15 mL Mouth Rinse QID  . metoprolol tartrate  10 mg Per Tube BID  . multivitamin with minerals  1 tablet Per Tube Daily  . pravastatin  20 mg Per Tube q1800  . thiamine  100 mg Per Tube Daily   Continuous Infusions: . feeding supplement (JEVITY 1.2 CAL) 1,000 mL (01/05/17 0554)     LOS: 14 days    Time spent: 40 minutes    Kenichi Cassada, Geraldo Docker, MD [facility] Pager [phone]   If 7PM-7AM, please contact night-coverage [email or web address] Password [email or web address] 01/05/2017, 8:11 AM

## 2017-01-05 NOTE — Progress Notes (Signed)
   01/05/17 1200  Clinical Encounter Type  Visited With Patient  Visit Type Initial;Spiritual support  Spiritual Encounters  Spiritual Needs Prayer;Emotional  Stress Factors  Patient Stress Factors None identified (nursing home care issues)  Visited with patient and sister. Patient has been in nursing care but not good experience. Concerned and hoping to find a different place with care he needs. Offered prayer and patient and sister very Patent attorney. Conard Novak, Chaplain

## 2017-01-05 NOTE — Evaluation (Signed)
Physical Therapy Evaluation Patient Details Name: Scott Rivera MRN: [id number] DOB: 1951-04-12 Today's Date: 01/05/2017   History of Present Illness  Pt adm with acute hypoxic respiratory failure.  PMH - cerebellar CVA [name] 2018, trach, PEG, recurrent aspiration PNA , sepsis, ckd, htn  Clinical Impression  Pt admitted with above diagnosis and presents to PT with functional limitations due to deficits listed below (See PT problem list). Pt needs skilled PT to maximize independence and safety to allow discharge to SNF. Pt continues to have severe impairments related to CVA as well as respiratory failure. With continued therapy pt may improve quality of life and have decr burden of care.     Follow Up Recommendations SNF    Equipment Recommendations  Other (comment) (To be assessed at next venue)    Recommendations for Other Services       Precautions / Restrictions Precautions Precautions: Fall Required Braces or Orthoses: Other Brace/Splint Other Brace/Splint: bil prevalon boots Restrictions Weight Bearing Restrictions: No      Mobility  Bed Mobility Overal bed mobility: Needs Assistance Bed Mobility: Supine to Sit;Sit to Supine     Supine to sit: +2 for physical assistance;Total assist;HOB elevated Sit to supine: +2 for physical assistance;Total assist   General bed mobility comments: Assist with all aspects  Transfers                    Ambulation/Gait                Stairs            Wheelchair Mobility    Modified Rankin (Stroke Patients Only)       Balance Overall balance assessment: Needs assistance Sitting-balance support: Bilateral upper extremity supported;Feet supported Sitting balance-Leahy Scale: Poor Sitting balance - Comments: Pt sat EOB x 10-15 minutes with min to max assist. Intially pt max assist. Worked on trunk mobility with rotation, flex/ext, and lateral leans. Pt then able to sit with min assist Postural control:  Left lateral lean;Posterior lean                                   Pertinent Vitals/Pain Pain Assessment: Faces Faces Pain Scale: No hurt    Home Living Family/patient expects to be discharged to:: Skilled nursing facility                      Prior Function Level of Independence: Needs assistance   Gait / Transfers Assistance Needed: Dependent since CVA in July 2018           Hand Dominance   Dominant Hand: Right    Extremity/Trunk Assessment   Upper Extremity Assessment Upper Extremity Assessment: RUE deficits/detail;LUE deficits/detail RUE Deficits / Details: Pt in resting hand splint. Decr PROM for elbow extension and shoulder  LUE Deficits / Details: Pt with slight assistance with shoulder flex and shoulder adduction. Pt in resting hand splint. Decr PROM for elbow extension and decr shoulder AAROM    Lower Extremity Assessment Lower Extremity Assessment: RLE deficits/detail;LLE deficits/detail RLE Deficits / Details: very limited PROM knee flexion, hip abduction, hip flexion. Only able to obtain ~10 degrees of knee flexion LLE Deficits / Details: very limited PROM knee flexion, hip abduction, hip flexion. Able to get knee into ~45 degrees flexion in sitting       Communication   Communication: Tracheostomy  Cognition Arousal/Alertness: Awake/alert Behavior During  Therapy: Flat affect Overall Cognitive Status: Difficult to assess                                 General Comments: Pt following some 1 step commands. Pt with rt neglect      General Comments      Exercises     Assessment/Plan    PT Assessment Patient needs continued PT services  PT Problem List Decreased strength;Decreased range of motion;Decreased activity tolerance;Decreased balance;Decreased mobility;Decreased coordination;Decreased cognition;Decreased knowledge of use of DME;Impaired sensation       PT Treatment Interventions DME  instruction;Functional mobility training;Therapeutic activities;Therapeutic exercise;Balance training;Neuromuscular re-education;Cognitive remediation;Patient/family education    PT Goals (Current goals can be found in the Care Plan section)  Acute Rehab PT Goals Patient Stated Goal: Unable to state PT Goal Formulation: Patient unable to participate in goal setting Time For Goal Achievement: 01/19/17 Potential to Achieve Goals: Fair    Frequency Min 2X/week   Barriers to discharge        Co-evaluation               AM-PAC PT "6 Clicks" Daily Activity  Outcome Measure Difficulty turning over in bed (including adjusting bedclothes, sheets and blankets)?: Unable Difficulty moving from lying on back to sitting on the side of the bed? : Unable Difficulty sitting down on and standing up from a chair with arms (e.g., wheelchair, bedside commode, etc,.)?: Unable Help needed moving to and from a bed to chair (including a wheelchair)?: Total Help needed walking in hospital room?: Total Help needed climbing 3-5 steps with a railing? : Total 6 Click Score: 6    End of Session Equipment Utilized During Treatment: Oxygen (trach collar) Activity Tolerance: Patient tolerated treatment well Patient left: in bed;with call bell/phone within reach Nurse Communication: Mobility status PT Visit Diagnosis: Other abnormalities of gait and mobility (R26.89);Muscle weakness (generalized) (M62.81);Other symptoms and signs involving the nervous system (D63.875)    Time: 6433-2951 PT Time Calculation (min) (ACUTE ONLY): 21 min   Charges:   PT Evaluation $PT Eval Moderate Complexity: 1 Mod     PT G CodesMarland Kitchen        [facility] PT [place] 01/05/2017, 3:29 PM

## 2017-01-05 NOTE — Progress Notes (Signed)
PULMONARY / CRITICAL CARE MEDICINE   Name: Scott Rivera MRN: [id number] DOB: [birth year]    ADMISSION DATE:  12/22/2016 CONSULTATION DATE:  12/22/16  REFERRING MD:  Dr. Roderic Palau / EDP   CHIEF COMPLAINT:  Low O2 saturations   HISTORY OF PRESENT ILLNESS:   67 y/o M who presented with acute hypoxic respiratory failure.  Baseline nonverbal, trach / PEG and lives in a SNF since July 2018 after CVA.   Recent admit from 8/25-9/6 for sepsis in the setting of aspiration PNA, AKI. He was discharged 9/6 to a SNF for care.  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      The patient returns 9/7 with reports of low oxygen saturations (60's) at the SNF.  He was reportedly found with increased secretions, thick yellow sputum and tachycardia.  On arrival to the ER, the patient vomited on the staff.  Initial labs - Na 137, K 4.4, Cl 100, CO2 26, glucose 128, BUN 33, Sr Cr 1.10, alk phos 171, albumin 2.0, AST 25, ALT 39, T. Bilirubin 0.7, lactic acid 1.31, WBC 24.9, Hgb 10 and platelets 841.  CXR showed LLL mucus plugging with cut off sign and airspace disease.  Attempts at suctioning the patient were made without improvement in saturations.  Uncuffed trach was changed to a cuffed and the patient was placed on mechanical ventilation.  PCCM called for ICU admission.   SUBJECTIVE:   No acute events.  Chest tube on hold as of 9/19.Requiring suctioning and inner cannula change Q 4  VITAL SIGNS: BP (!) 161/76 (BP Location: Left Arm)   Pulse 79   Temp 98.5 F (36.9 C) (Oral)   Resp (!) 25   Ht 5' 11"  (1.803 m)   Wt 153 lb (69.4 kg)   SpO2 96%   BMI 21.34 kg/m   VENTILATOR SETTINGS: FiO2 (%):  [28 %] 28 %  INTAKE / OUTPUT:  Intake/Output Summary (Last 24 hours) at 01/05/17 1106 Last data filed at 01/05/17 0909  Gross per 24 hour  Intake             1700 ml   Output             2325 ml  Net             -625 ml     PHYSICAL EXAMINATION: General:  Frail male, arouses easily, on ATC HEENT: #6 cuffedTrach with copious thick secretions, midline and secure  PSY: flat affect Neuro: not following commands CV: RRR, S1, S2, No RMG PULM: coarse rhonchi bilaterally throughout  RA:XENM, non-tender, bsx4 active  Extremities: warm/dry- edema  Skin: no rashes or lesions, warm dry and intact  LABS:  BMET  Recent Labs Lab 01/02/17 0330 01/04/17 0320 01/05/17 0841  NA 135 135 137  K 4.2 4.2 4.5  CL 101 102 103  CO2 22 26 27   BUN 33* 31* 34*  CREATININE 1.07 0.85 0.90  GLUCOSE 106* 103* 111*    Electrolytes  Recent Labs Lab 01/02/17 0330 01/04/17 0320 01/05/17 0841  CALCIUM 10.0 10.5* 10.8*  MG  --   --  2.0    CBC  Recent Labs Lab 01/01/17 0412 01/04/17 0320 01/05/17 0841  WBC 15.7* 14.8* 16.6*  HGB 8.7* 8.6* 10.0*  HCT 28.1* 28.4* 33.3*  PLT 750* 732* 842*    Coag's  Recent Labs  Lab 01/02/17 0645  APTT 59*  INR 0.99    Sepsis Markers No results for input(s): LATICACIDVEN, PROCALCITON, O2SATVEN in the last 168 hours.  ABG No results for input(s): PHART, PCO2ART, PO2ART in the last 168 hours.  Liver Enzymes  Recent Labs Lab 01/01/17 0412  AST 37  ALT 36  ALKPHOS 142*  BILITOT 0.5  ALBUMIN 1.7*    Cardiac Enzymes No results for input(s): TROPONINI, PROBNP in the last 168 hours.  Glucose  Recent Labs Lab 01/04/17 1158 01/04/17 1622 01/04/17 2107 01/05/17 0004 01/05/17 0355 01/05/17 0749  GLUCAP 115* 111* 122* 108* 96 101*    Imaging Ir Patient Eval Tech 0-60 Mins  Result Date: 01/05/2017 Darnelle Spangle     01/05/2017 10:46 AM Unclogged gastrostomy tube using short 75cm amplatz wire and declogger. Gastrostomy tube flushed with ease.  Tube is good to use.     STUDIES:  CT Chest 9/14 >> LLL consolidation suspicious for PNA, superimposed moderate layering / partially loculated left  pleural effusion increased since July, right atelectasis, trach in good position  CULTURES: BCx2 9/7 >> negative   Tracheal Aspirate 9/7 >> (not sent)   ANTIBIOTICS: Vanco 9/7 >> 9/13 Zosyn 9/7 >> 9/13  SIGNIFICANT EVENTS: 9/06  Discharge after admit with aspiration PNA, AKI 9/07  Re-admit with acute hypoxic respiratory failure  9//19 ct guided chest tube on hold.  LINES/TUBES: Trach (cuffed changed) 9/7 >>  DISCUSSION: 66 y/o M with hx of severe cerebellar CVA / non-verbal status, SNF resident with trach / PEG readmitted 9/7 (after d/c 9/6) for worsening of hypoxic respiratory failure.  CXR concerning for increased L sided airspace disease and mucus plugging.  Off vent but now has large left loculated effusion.  CT chest demonstrates consolidation / partially loculated effusion 9/14.      ASSESSMENT / PLAN:  Acute on chronic respiratory failure. Aspiration pneumonia with mucus plugging (NOS) SMALL Left loculated pleural effusion  Tracheostomy status. P: Continue ATC as tolerated ( 28% with 98% sats 9/21) Patient is not a candidate for VATS  Duoneb Q 6 Suction prn Trach care with inner cannula change Q4 IR consulted, recommending thoracentesis under US guidance prior to chest tube, 9/19 currently on hold per discussion of Dr. Titus Mould and IR.  Further follow up this week. Would  only drain if pt. or effusion worsens Zosyn completed  9/18 for total 10 days Intermittent CXR to follow effusion  Consider change to cuffless trach next week if continues to tolerate ATC   Trach care per PCCM All other care per Primary Team  DVT prophylaxis - SQ heparin SUP - pepcid Nutrition - tube feeds Goals of care - full code Family - sister not at bedside.   Magdalen Spatz, AGACNP-BC Maryanna Shape  Pulmonary/ CCM Pager [phone] 01/05/2017, 11:06 AM  STAFF NOTE: I, Merrie Roof, MD FACP have personally reviewed patient's available data, including medical history, events of  note, physical examination and test results as part of my evaluation. I have discussed with resident/NP and other care providers such as pharmacist, RN and RRT. In addition, I personally evaluated patient and elicited key findings of: no fevers, not fc, no distress on Trach collar, BS anterior clear, pcxr in past I reviewed showed small left hazziness, CT with small locumated effusion, risk benefit ratio does NOT favor drainage at this time, I d/w IR, if fevers, if worsening effusion would then have pigtail placed as likely high risk ptx with thora, will recommend change  to cuffless trach today  Lavon Paganini. Titus Mould, MD, [place] Pgr: [facility] 01/05/2017 2:43 PM

## 2017-01-05 NOTE — Plan of Care (Signed)
Problem: Pain Managment: Goal: General experience of comfort will improve Outcome: Progressing Patient not exhibiting any signs of distress or pain at this time, will continue to monitor closely.

## 2017-01-05 NOTE — Progress Notes (Addendum)
4:40pm [facility] plans to admit patient when trach can be changed to cuffless  2:20pm CSW following for SNF placement- per CCM note patient will be evaluated to change to cuffless trach next week if remains stable on trach collar  No SNF beds available at this time and no SNF can accept until changed to cuffless  CSW continuing to search for SNF beds  Jorge Ny, [place] Worker [phone]

## 2017-01-05 NOTE — Progress Notes (Signed)
Nutrition Follow Up  DOCUMENTATION CODES:   Severe malnutrition in context of chronic illness  INTERVENTION:    Continue Jevity 1.2 formula at 50 ml/hr with Prostat TID  TF regimen providing 1740 kcals, 112 gm protein, 972 ml of free water  NUTRITION DIAGNOSIS:   Malnutrition (Severe) related to chronic illness as evidenced by percent weight loss, severe depletion of muscle mass, severe depletion of body fat, ongoing  GOAL:   Patient will meet greater than or equal to 90% of their needs, met  MONITOR:   TF tolerance, Skin, Labs, Weight trends, Vent status  ASSESSMENT:    66 yo male admitted with acute respiratory failure with LLL mucous plugging, possible aspiration with N/V on arrival to ER. 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 Pt with additional hx of CKD III, HTN   Pt currently on trach collar. Current TF regimen: Jevity 1.2 at goal rate of 50 ml/hr with Prostat 30 ml TID via PEG tube. Free water flushes at 200 ml every 6 hours.  Medications reviewed and include MVI and thiamine. Labs reviewed. BUN 34 (H). CBG's [id number].  CCM note reviewed. Small L pleural effusion. Not candidate for VATS. Possible trach change to cuffless next week.  Diet Order:  Diet NPO time specified  Skin:  Wound (see comment) (unstageable sacrum)  Last BM:  9/21  Height:   Ht Readings from Last 1 Encounters:  12/23/16 5' 11"  (1.803 m)    Weight:   Wt Readings from Last 1 Encounters:  01/05/17 153 lb (69.4 kg)  Admit wt         147 lb (66.7 kg)  BMI:  Body mass index is 21.34 kg/m.  Estimated Nutritional Needs:   Kcal:  1600-1800  Protein:  100-115 gm  Fluid:  >/= 2 L  EDUCATION NEEDS:   No education needs identified at this time   Arthur Holms, RD, LDN Pager #: [phone] After-Hours Pager #: [phone]

## 2017-01-05 NOTE — Progress Notes (Signed)
Patient ID: Scott Rivera, male   DOB: [birth year], 66 y.o.   MRN: [id number] RN stopped me while on the floor to inform me this patient's g-tube is clogged.  I attempted with flushings which did not work. We then got a wire and a brush.  After this was attempted, flushings were reattempted with success.  The g-tube was then flushed copiously with air and water successfully.  g-tube is ready for immediate use.  Analysia Dungee E 11:17 AM 01/05/2017

## 2017-01-05 NOTE — Progress Notes (Signed)
Changed pt trach to #6SH cuffless per MD order.  SPO2 97%, good color change ETCO2, diminished BBS, Pt tolerated well, RT to  Monitor.  Trach secured with trach ties

## 2017-01-06 DIAGNOSIS — L89152 Pressure ulcer of sacral region, stage 2: Secondary | ICD-10-CM

## 2017-01-06 LAB — BASIC METABOLIC PANEL
Anion gap: 7 (ref 5–15)
BUN: 39 mg/dL — ABNORMAL HIGH (ref 6–20)
CO2: 28 mmol/L (ref 22–32)
Calcium: 10.6 mg/dL — ABNORMAL HIGH (ref 8.9–10.3)
Chloride: 102 mmol/L (ref 101–111)
Creatinine, Ser: 1 mg/dL (ref 0.61–1.24)
GFR calc Af Amer: >60 mL/min (ref >60)
GFR calc non Af Amer: >60 mL/min (ref >60)
Glucose, Bld: 92 mg/dL (ref 65–99)
Potassium: 4.3 mmol/L (ref 3.5–5.1)
Sodium: 137 mmol/L (ref 135–145)

## 2017-01-06 LAB — CBC WITH DIFFERENTIAL/PLATELET
Basophils Absolute: 0 10*3/uL (ref 0.0–0.1)
Basophils Relative: 0 %
Eosinophils Absolute: 0.4 10*3/uL (ref 0.0–0.7)
Eosinophils Relative: 2 %
HCT: 29.5 % — ABNORMAL LOW (ref 39.0–52.0)
Hemoglobin: 8.9 g/dL — ABNORMAL LOW (ref 13.0–17.0)
Lymphocytes Relative: 16 %
Lymphs Abs: 2.7 10*3/uL (ref 0.7–4.0)
MCH: 27.8 pg (ref 26.0–34.0)
MCHC: 30.2 g/dL (ref 30.0–36.0)
MCV: 92.2 fL (ref 78.0–100.0)
Monocytes Absolute: 1.2 10*3/uL — ABNORMAL HIGH (ref 0.1–1.0)
Monocytes Relative: 7 %
Neutro Abs: 13 10*3/uL — ABNORMAL HIGH (ref 1.7–7.7)
Neutrophils Relative %: 75 %
Platelets: 800 10*3/uL — ABNORMAL HIGH (ref 150–400)
RBC: 3.2 MIL/uL — ABNORMAL LOW (ref 4.22–5.81)
RDW: 16.1 % — ABNORMAL HIGH (ref 11.5–15.5)
WBC: 17.3 10*3/uL — ABNORMAL HIGH (ref 4.0–10.5)

## 2017-01-06 LAB — MAGNESIUM: Magnesium: 2.1 mg/dL (ref 1.7–2.4)

## 2017-01-06 LAB — GLUCOSE, CAPILLARY
Glucose-Capillary: 102 mg/dL — ABNORMAL HIGH (ref 65–99)
Glucose-Capillary: 109 mg/dL — ABNORMAL HIGH (ref 65–99)
Glucose-Capillary: 116 mg/dL — ABNORMAL HIGH (ref 65–99)
Glucose-Capillary: 121 mg/dL — ABNORMAL HIGH (ref 65–99)
Glucose-Capillary: 124 mg/dL — ABNORMAL HIGH (ref 65–99)
Glucose-Capillary: 86 mg/dL (ref 65–99)
Glucose-Capillary: 99 mg/dL (ref 65–99)

## 2017-01-06 NOTE — Progress Notes (Addendum)
PROGRESS NOTE    CAYDIN YEATTS  MWU:[id number] DOB: May 29, 1950 DOA: 12/22/2016 PCP: Patient, No Pcp Per   Brief Narrative:  66 y/o M  PMHx  Severe Cerebellar CVA, HTN, CKD stage III, anemia due to CKD,   Presented with acute hypoxic respiratory failure after he was found with increased secretions and thick yellow sputum.  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    He was admitted 8/25 > 9/6 for sepsis in the setting of aspiration PNA w/ AKI.        In the ED a CXR showed LLL mucus plugging with cut off sign and airspace disease.  Attempts at suctioning the patient were made without improvement in saturations.  Uncuffed trach was changed to a cuffed and the patient was placed on mechanical ventilation.    Subjective: 9/22 A/O 0, eyes open, following commands.     Assessment & Plan:   Active Problems:   Cerebellar infarct [facility])   Essential hypertension   Pressure injury of skin   Dysphagia, post-stroke   PNA (pneumonia)   Loculated pleural effusion  Acute on chronic respiratory failure with hypoxia -Completed ten-day course antibiotics per [facility] M  LEFT loculated Pleural Effusion -Not appropriate for VATS -Per Dr. Garen Lah note 9/19 after discussion with PCCM IR guided thoracentesis canceled  Essential HTN   -Well controlled -Amlodipine 10 mg daily -Clonidine 0.1 mg TID -Metoprolol 10 mg BID   CKD stage III     Recent Labs Lab 01/01/17 0412 01/02/17 0330 01/04/17 0320 01/05/17 0841  CREATININE 1.22 1.07 0.85 0.90  -baseline     Dysphagia -Chronic PEG tube  Severe protein calorie malnutrition  -continue tube feeds  Anemia of critical illness/chronic disease  -9/10 transfused 1 unit PRBC -Negative evidence acute blood loss. -Anemia most likely secondary to recurring critical illness, poor attention status, and CKD     Recent Labs Lab 12/31/16 0342 01/01/17 0412  01/04/17 0320 01/05/17 0841  HGB 9.1* 8.7* 8.6* 10.0*  -Stable   Leukocytosis/Sepsis?   Recent Labs Lab 01/01/17 0412 01/04/17 0320 01/05/17 0841 01/06/17 1152  WBC 15.7* 14.8* 16.6* 17.3*  -although afebrile, negative bands, negative left shift patient's WBCs continue to climb. Possible sites of infection: Loculated pleural effusion vs sacral decubitus ulcer, urine    Cerebellar CVA with aphasia   -Devastating event WC/no measurable recovery noted apart from patient regaining consciousness.  -PALLIATIVE care met with sister who again states continue full aggressive medical care.     Stage 1 sacral decubitus ulcer -Present prior to admission -Continue care per WOC suggestions     DVT prophylaxis: SCD/subcutaneous heparin Code Status: Full Family Communication: None Disposition Plan: SNF   Consultants:  [facility] M    Procedures/Significant Events:  9/06  Discharge after admit with aspiration PNA, AKI 9/07  Re-admit with acute hypoxic respiratory failure due to mucus plugging  9/10 transfused 1 unit PRBC 9/12 [facility] assumed care  9/17 ultrasound chest: LEFT small loculated pleural effusion 9/21 change to #6 cuffless trach    I have personally reviewed and interpreted all radiology studies and my findings are as above.  VENTILATOR SETTINGS: TC FiO2: 28%    Cultures 9/7 blood negative     Antimicrobials: Anti-infectives    Start     Dose/Rate Stop   12/30/16 1400  piperacillin-tazobactam (ZOSYN) IVPB 3.375 g     3.375 g 12.5 mL/hr  over 240 Minutes 01/02/17 1855   12/26/16 0100  vancomycin (VANCOCIN) 500 mg in sodium chloride 0.9 % 100 mL IVPB     500 mg 100 mL/hr over 60 Minutes 12/28/16 0344   12/22/16 2200  vancomycin (VANCOCIN) IVPB 750 mg/150 ml premix  Status:  Discontinued     750 mg 150 mL/hr over 60 Minutes 12/25/16 2236   12/22/16 1800  piperacillin-tazobactam (ZOSYN) IVPB 3.375 g  Status:  Discontinued     3.375 g 12.5 mL/hr over 240 Minutes  12/28/16 1934   12/22/16 1200  vancomycin (VANCOCIN) IVPB 1000 mg/200 mL premix     1,000 mg 200 mL/hr over 60 Minutes 12/22/16 1430   12/22/16 1200  piperacillin-tazobactam (ZOSYN) IVPB 3.375 g     3.375 g 100 mL/hr over 30 Minutes 12/22/16 1330       Devices    LINES / TUBES:  #6 Cuffless trach 9/21>>    Continuous Infusions: . feeding supplement (JEVITY 1.2 CAL) 1,000 mL (01/06/17 0717)     Objective: Vitals:   01/06/17 0012 01/06/17 0333 01/06/17 0742 01/06/17 0800  BP: (!) 96/57 (!) 112/55  127/62  Pulse: 89 83 78   Resp: (!) 30 (!) 29 (!) 23   Temp:  98.2 F (36.8 C)  98.2 F (36.8 C)  TempSrc:  Oral  Axillary  SpO2: 98% 96% 97%   Weight:  151 lb 0.2 oz (68.5 kg)    Height:        Intake/Output Summary (Last 24 hours) at 01/06/17 1010 Last data filed at 01/06/17 0804  Gross per 24 hour  Intake          1128.33 ml  Output             1225 ml  Net           -96.67 ml   Filed Weights   01/04/17 0353 01/05/17 0356 01/06/17 0333  Weight: 154 lb 8.7 oz (70.1 kg) 153 lb (69.4 kg) 151 lb 0.2 oz (68.5 kg)   Physical Exam:  General: A/O 0, follow some commands, positive chronic respiratory distress Neck:  Negative scars, masses, torticollis, lymphadenopathy, JVD, #6 cuffless trach in place Lungs: RUL/RLL/LUL Clear to auscultation. Decreased breath sounds LLL, negative wheezes or crackles Cardiovascular: Regular rate and rhythm without murmur gallop or rub normal S1 and S2 Abdomen: negative abdominal pain, nondistended, positive soft, bowel sounds, no rebound, no ascites, no appreciable mass, PEG tube present negative sign of infection Extremities: No significant cyanosis, clubbing, or edema bilateral lower extremities Skin: Stage IV sacral decubitus ulcer Psychiatric:  Unable to evaluate secondary to altered mental status (CVA)  Central nervous system:  Eyes open, raises bilateral arms to command, will not move/wiggle feet command today .     Data  Reviewed: Care during the described time interval was provided by me .  I have reviewed this patient's available data, including medical history, events of note, physical examination, and all test results as part of my evaluation.   CBC:  Recent Labs Lab 12/31/16 0342 01/01/17 0412 01/04/17 0320 01/05/17 0841  WBC 19.1* 15.7* 14.8* 16.6*  NEUTROABS  --   --   --  12.1*  HGB 9.1* 8.7* 8.6* 10.0*  HCT 29.6* 28.1* 28.4* 33.3*  MCV 91.1 90.6 92.2 92.5  PLT 645* 750* 732* 397*   Basic Metabolic Panel:  Recent Labs Lab 01/01/17 0412 01/02/17 0330 01/04/17 0320 01/05/17 0841  NA 134* 135 135 137  K 4.0 4.2 4.2  4.5  CL 100* 101 102 103  CO2 25 22 26 27   GLUCOSE 103* 106* 103* 111*  BUN 34* 33* 31* 34*  CREATININE 1.22 1.07 0.85 0.90  CALCIUM 9.9 10.0 10.5* 10.8*  MG  --   --   --  2.0   GFR: Estimated Creatinine Clearance: 78.2 mL/min (by C-G formula based on SCr of 0.9 mg/dL). Liver Function Tests:  Recent Labs Lab 01/01/17 0412  AST 37  ALT 36  ALKPHOS 142*  BILITOT 0.5  PROT 7.4  ALBUMIN 1.7*   No results for input(s): LIPASE, AMYLASE in the last 168 hours. No results for input(s): AMMONIA in the last 168 hours. Coagulation Profile:  Recent Labs Lab 01/02/17 0645  INR 0.99   Cardiac Enzymes: No results for input(s): CKTOTAL, CKMB, CKMBINDEX, TROPONINI in the last 168 hours. BNP (last 3 results) No results for input(s): PROBNP in the last 8760 hours. HbA1C: No results for input(s): HGBA1C in the last 72 hours. CBG:  Recent Labs Lab 01/05/17 2025 01/05/17 2118 01/05/17 2346 01/06/17 0327 01/06/17 0809  GLUCAP 136* 128* 121* 124* 99   Lipid Profile: No results for input(s): CHOL, HDL, LDLCALC, TRIG, CHOLHDL, LDLDIRECT in the last 72 hours. Thyroid Function Tests: No results for input(s): TSH, T4TOTAL, FREET4, T3FREE, THYROIDAB in the last 72 hours. Anemia Panel: No results for input(s): VITAMINB12, FOLATE, FERRITIN, TIBC, IRON, RETICCTPCT in the  last 72 hours. Urine analysis:    Component Value Date/Time   COLORURINE AMBER (A) 12/16/2016 1625   APPEARANCEUR CLEAR 12/16/2016 1625   LABSPEC 1.018 12/16/2016 1625   PHURINE 5.0 12/16/2016 1625   GLUCOSEU NEGATIVE 12/16/2016 1625   HGBUR NEGATIVE 12/16/2016 1625   BILIRUBINUR NEGATIVE 12/16/2016 1625   KETONESUR NEGATIVE 12/16/2016 1625   PROTEINUR 30 (A) 12/16/2016 1625   NITRITE NEGATIVE 12/16/2016 1625   LEUKOCYTESUR NEGATIVE 12/16/2016 1625   Sepsis Labs: @LABRCNTIP (procalcitonin:4,lacticidven:4)  )No results found for this or any previous visit (from the past 240 hour(s)).       Radiology Studies: Ir Patient Eval Tech 0-60 Mins  Result Date: 01/05/2017 Darnelle Spangle     01/05/2017 10:46 AM Unclogged gastrostomy tube using short 75cm amplatz wire and declogger. Gastrostomy tube flushed with ease.  Tube is good to use.        Scheduled Meds: . amLODipine  10 mg Per Tube Daily  . aspirin  325 mg Per Tube Daily  . chlorhexidine gluconate (MEDLINE KIT)  15 mL Mouth Rinse BID  . cloNIDine  0.1 mg Per Tube TID  . clopidogrel  75 mg Per Tube Daily  . famotidine  20 mg Per Tube Q12H  . feeding supplement (PRO-STAT SUGAR FREE 64)  30 mL Per Tube TID  . free water  200 mL Per Tube Q6H  . gabapentin  200 mg Per Tube QHS  . mouth rinse  15 mL Mouth Rinse QID  . metoprolol tartrate  10 mg Per Tube BID  . multivitamin with minerals  1 tablet Per Tube Daily  . pravastatin  20 mg Per Tube q1800  . thiamine  100 mg Per Tube Daily   Continuous Infusions: . feeding supplement (JEVITY 1.2 CAL) 1,000 mL (01/06/17 0717)     LOS: 15 days    Time spent: 40 minutes    Kennice Finnie, Geraldo Docker, MD [facility] Pager [phone]   If 7PM-7AM, please contact night-coverage [email or web address] Password [email or web address] 01/06/2017, 10:10 AM

## 2017-01-07 ENCOUNTER — Inpatient Hospital Stay (HOSPITAL_COMMUNITY): Payer: Medicare HMO

## 2017-01-07 DIAGNOSIS — R1313 Dysphagia, pharyngeal phase: Secondary | ICD-10-CM

## 2017-01-07 LAB — COMPREHENSIVE METABOLIC PANEL
ALT: 28 U/L (ref 17–63)
AST: 19 U/L (ref 15–41)
Albumin: 2 g/dL — ABNORMAL LOW (ref 3.5–5.0)
Alkaline Phosphatase: 105 U/L (ref 38–126)
Anion gap: 8 (ref 5–15)
BUN: 39 mg/dL — ABNORMAL HIGH (ref 6–20)
CO2: 26 mmol/L (ref 22–32)
Calcium: 10.7 mg/dL — ABNORMAL HIGH (ref 8.9–10.3)
Chloride: 105 mmol/L (ref 101–111)
Creatinine, Ser: 1.05 mg/dL (ref 0.61–1.24)
GFR calc Af Amer: >60 mL/min (ref >60)
GFR calc non Af Amer: >60 mL/min (ref >60)
Glucose, Bld: 126 mg/dL — ABNORMAL HIGH (ref 65–99)
Potassium: 4.2 mmol/L (ref 3.5–5.1)
Sodium: 139 mmol/L (ref 135–145)
Total Bilirubin: 0.4 mg/dL (ref 0.3–1.2)
Total Protein: 7.9 g/dL (ref 6.5–8.1)

## 2017-01-07 LAB — PROCALCITONIN: Procalcitonin: 0.17 ng/mL

## 2017-01-07 LAB — GLUCOSE, CAPILLARY
Glucose-Capillary: 105 mg/dL — ABNORMAL HIGH (ref 65–99)
Glucose-Capillary: 115 mg/dL — ABNORMAL HIGH (ref 65–99)
Glucose-Capillary: 86 mg/dL (ref 65–99)
Glucose-Capillary: 96 mg/dL (ref 65–99)
Glucose-Capillary: 98 mg/dL (ref 65–99)
Glucose-Capillary: 99 mg/dL (ref 65–99)

## 2017-01-07 LAB — MAGNESIUM: Magnesium: 2.1 mg/dL (ref 1.7–2.4)

## 2017-01-07 LAB — LACTIC ACID, PLASMA
Lactic Acid, Venous: 0.9 mmol/L (ref 0.5–1.9)
Lactic Acid, Venous: 0.9 mmol/L (ref 0.5–1.9)

## 2017-01-07 MED ORDER — SODIUM BICARBONATE 650 MG PO TABS
650.0000 mg | ORAL_TABLET | Freq: Once | ORAL | Status: AC
  Administered 2017-01-07: 650 mg via ORAL
  Filled 2017-01-07: qty 1

## 2017-01-07 MED ORDER — PANCRELIPASE (LIP-PROT-AMYL) 12000-38000 UNITS PO CPEP
24000.0000 [IU] | ORAL_CAPSULE | Freq: Once | ORAL | Status: AC
  Administered 2017-01-07: 24000 [IU] via ORAL
  Filled 2017-01-07: qty 2

## 2017-01-07 MED ORDER — DEXTROSE 5 % IV SOLN
INTRAVENOUS | Status: DC
  Administered 2017-01-07: 19:00:00 via INTRAVENOUS

## 2017-01-07 NOTE — Plan of Care (Signed)
Problem: Pain Managment: Goal: General experience of comfort will improve Outcome: Progressing Discussed with patient plan of care for the evening, pain management and frequent suction.

## 2017-01-07 NOTE — Plan of Care (Signed)
Problem: Nutritional: Goal: Intake of prescribed amount of daily calories will improve Outcome: Progressing sdiscussed plan of care for the evening, pain management, mouth care and his feeding tube status with some teach back displayed.

## 2017-01-07 NOTE — Progress Notes (Signed)
PROGRESS NOTE    Scott Rivera  SHU:[id number] DOB: 1950/10/22 DOA: 12/22/2016 PCP: Patient, No Pcp Per   Brief Narrative:  66 y/o M  PMHx  Severe Cerebellar CVA, HTN, CKD stage III, anemia due to CKD,   Presented with acute hypoxic respiratory failure after he was found with increased secretions and thick yellow sputum.  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    He was admitted 8/25 > 9/6 for sepsis in the setting of aspiration PNA w/ AKI.        In the ED a CXR showed LLL mucus plugging with cut off sign and airspace disease.  Attempts at suctioning the patient were made without improvement in saturations.  Uncuffed trach was changed to a cuffed and the patient was placed on mechanical ventilation.    Subjective: 9/23 A/O 0, eyes open, does not follow commands     Assessment & Plan:   Active Problems:   Cerebellar infarct [facility])   Essential hypertension   Pressure injury of skin   Dysphagia, post-stroke   PNA (pneumonia)   Loculated pleural effusion  Acute on chronic respiratory failure with hypoxia -Completed ten-day course antibiotics per [facility] M  LEFT loculated Pleural Effusion -Not appropriate for VATS -Per Dr. Garen Lah note 9/19 after discussion with PCCM IR guided thoracentesis canceled  Essential HTN   -Well controlled -Amlodipine 10 mg daily -Clonidine 0.1 mg TID -Metoprolol 10 mg BID   CKD stage III    Recent Labs Lab 01/01/17 0412 01/02/17 0330 01/04/17 0320 01/05/17 0841 01/06/17 1152  CREATININE 1.22 1.07 0.85 0.90 1.00  -baseline     Dysphagia -Chronic PEG tube  Severe protein calorie malnutrition  -continue tube feeds  Anemia of critical illness/chronic disease  -9/10 transfused 1 unit PRBC -Negative evidence acute blood loss. -Anemia most likely secondary to recurring critical illness, poor attention status, and CKD     Recent Labs Lab 01/01/17 0412  01/04/17 0320 01/05/17 0841 01/06/17 1152  HGB 8.7* 8.6* 10.0* 8.9*  -Stable   Leukocytosis/Sepsis?    Recent Labs Lab 01/01/17 0412 01/04/17 0320 01/05/17 0841 01/06/17 1152  WBC 15.7* 14.8* 16.6* 17.3*  -although afebrile, negative bands, negative left shift patient's WBCs continue to climb. Possible sites of infection: Loculated pleural effusion vs sacral decubitus ulcer, urine  -Blood culture, urine pending -PCXR pending. Dependent upon findings may need to have CT-guided needle aspiration of LEFT loculated effusion -Sacral decubitus ulcer is a stage I, has been entered as a stage IV erroneously   Cerebellar CVA with aphasia   -Devastating event WC/no measurable recovery noted apart from patient regaining consciousness.  -PALLIATIVE care met with sister who again states continue full aggressive medical care.     Stage 1 sacral decubitus ulcer -Present prior to admission -Continue care per WOC suggestions     DVT prophylaxis: SCD/subcutaneous heparin Code Status: Full Family Communication:  Sister at bedside for discussion of plan of care Disposition Plan: Initially thinking patient may be ready for discharge today however unexplained leukocytosis.    Consultants:  [facility] M    Procedures/Significant Events:  9/06  Discharge after admit with aspiration PNA, AKI 9/07  Re-admit with acute hypoxic respiratory failure due to mucus plugging  9/10 transfused 1 unit PRBC 9/12 [facility] assumed care  9/17 ultrasound chest: LEFT small loculated pleural effusion 9/21 change to #6 cuffless trach    I  have personally reviewed and interpreted all radiology studies and my findings are as above.  VENTILATOR SETTINGS: TC FiO2: 28%    Cultures 9/7 blood negative 9/23 blood pending  9/23 urine pending    Antimicrobials: Anti-infectives    Start     Dose/Rate Stop   12/30/16 1400  piperacillin-tazobactam (ZOSYN) IVPB 3.375 g     3.375 g 12.5 mL/hr over 240 Minutes  01/02/17 1855   12/26/16 0100  vancomycin (VANCOCIN) 500 mg in sodium chloride 0.9 % 100 mL IVPB     500 mg 100 mL/hr over 60 Minutes 12/28/16 0344   12/22/16 2200  vancomycin (VANCOCIN) IVPB 750 mg/150 ml premix  Status:  Discontinued     750 mg 150 mL/hr over 60 Minutes 12/25/16 2236   12/22/16 1800  piperacillin-tazobactam (ZOSYN) IVPB 3.375 g  Status:  Discontinued     3.375 g 12.5 mL/hr over 240 Minutes 12/28/16 1934   12/22/16 1200  vancomycin (VANCOCIN) IVPB 1000 mg/200 mL premix     1,000 mg 200 mL/hr over 60 Minutes 12/22/16 1430   12/22/16 1200  piperacillin-tazobactam (ZOSYN) IVPB 3.375 g     3.375 g 100 mL/hr over 30 Minutes 12/22/16 1330       Devices    LINES / TUBES:  #6 Cuffless trach 9/21>>    Continuous Infusions: . feeding supplement (JEVITY 1.2 CAL) 1,000 mL (01/07/17 0306)     Objective: Vitals:   01/07/17 0700 01/07/17 0730 01/07/17 0745 01/07/17 0755  BP: 131/68     Pulse: 88   94  Resp: (!) 25   (!) 24  Temp:  98.6 F (37 C)    TempSrc:  Oral Oral   SpO2: 95%   98%  Weight:      Height:        Intake/Output Summary (Last 24 hours) at 01/07/17 1212 Last data filed at 01/07/17 0900  Gross per 24 hour  Intake             2505 ml  Output             1500 ml  Net             1005 ml   Filed Weights   01/05/17 0356 01/06/17 0333 01/07/17 0226  Weight: 153 lb (69.4 kg) 151 lb 0.2 oz (68.5 kg) 151 lb 7.3 oz (68.7 kg)   Physical Exam:  General: Showed 0, eyes open, not following commands, positive chronic respiratory distress Neck:  Negative scars, masses, torticollis, lymphadenopathy, JVD Lungs: RUL/RLL/LUL clear to auscultation. Decrease/absent breath sounds LLL, negative wheezes or crackles  Cardiovascular: Regular rate and rhythm without murmur gallop or rub normal S1 and S2 Abdomen: negative abdominal pain, nondistended, positive soft, bowel sounds, no rebound, no ascites, no appreciable mass, PEG tube present negative sign of  infection Extremities: No significant cyanosis, clubbing, or edema bilateral lower extremities Skin: Stage I sacral decubitus ulcer Psychiatric:  Unable to evaluate secondary to altered mental status (CVA)  Central nervous system:  Eyes open, tracks you around room, will not follow commands.     Data Reviewed: Care during the described time interval was provided by me .  I have reviewed this patient's available data, including medical history, events of note, physical examination, and all test results as part of my evaluation.   CBC:  Recent Labs Lab 01/01/17 0412 01/04/17 0320 01/05/17 0841 01/06/17 1152  WBC 15.7* 14.8* 16.6* 17.3*  NEUTROABS  --   --  12.1*   13.0*  HGB 8.7* 8.6* 10.0* 8.9*  HCT 28.1* 28.4* 33.3* 29.5*  MCV 90.6 92.2 92.5 92.2  PLT 750* 732* 842* 401*   Basic Metabolic Panel:  Recent Labs Lab 01/01/17 0412 01/02/17 0330 01/04/17 0320 01/05/17 0841 01/06/17 1152  NA 134* 135 135 137 137  K 4.0 4.2 4.2 4.5 4.3  CL 100* 101 102 103 102  CO2 _0 GLUCOSE 103* 106* 103* 111* 92  BUN 34* 33* 31* 34* 39*  CREATININE 1.22 1.07 0.85 0.90 1.00  CALCIUM 9.9 10.0 10.5* 10.8* 10.6*  MG  --   --   --  2.0 2.1   GFR: Estimated Creatinine Clearance: 70.6 mL/min (by C-G formula based on SCr of 1 mg/dL). Liver Function Tests:  Recent Labs Lab 01/01/17 0412  AST 37  ALT 36  ALKPHOS 142*  BILITOT 0.5  PROT 7.4  ALBUMIN 1.7*   No results for input(s): LIPASE, AMYLASE in the last 168 hours. No results for input(s): AMMONIA in the last 168 hours. Coagulation Profile:  Recent Labs Lab 01/02/17 0645  INR 0.99   Cardiac Enzymes: No results for input(s): CKTOTAL, CKMB, CKMBINDEX, TROPONINI in the last 168 hours. BNP (last 3 results) No results for input(s): PROBNP in the last 8760 hours. HbA1C: No results for input(s): HGBA1C in the last 72 hours. CBG:  Recent Labs Lab 01/06/17 1610 01/06/17 2023 01/06/17 2351 01/07/17 0326  01/07/17 0749  GLUCAP 102* 116* 109* 98 105*   Lipid Profile: No results for input(s): CHOL, HDL, LDLCALC, TRIG, CHOLHDL, LDLDIRECT in the last 72 hours. Thyroid Function Tests: No results for input(s): TSH, T4TOTAL, FREET4, T3FREE, THYROIDAB in the last 72 hours. Anemia Panel: No results for input(s): VITAMINB12, FOLATE, FERRITIN, TIBC, IRON, RETICCTPCT in the last 72 hours. Urine analysis:    Component Value Date/Time   COLORURINE AMBER (A) 12/16/2016 1625   APPEARANCEUR CLEAR 12/16/2016 1625   LABSPEC 1.018 12/16/2016 1625   PHURINE 5.0 12/16/2016 1625   GLUCOSEU NEGATIVE 12/16/2016 1625   HGBUR NEGATIVE 12/16/2016 1625   BILIRUBINUR NEGATIVE 12/16/2016 1625   KETONESUR NEGATIVE 12/16/2016 1625   PROTEINUR 30 (A) 12/16/2016 1625   NITRITE NEGATIVE 12/16/2016 1625   LEUKOCYTESUR NEGATIVE 12/16/2016 1625   Sepsis Labs: _1 (procalcitonin:4,lacticidven:4)  )No results found for this or any previous visit (from the past 240 hour(s)).       Radiology Studies: No results found.      Scheduled Meds: . amLODipine  10 mg Per Tube Daily  . aspirin  325 mg Per Tube Daily  . chlorhexidine gluconate (MEDLINE KIT)  15 mL Mouth Rinse BID  . cloNIDine  0.1 mg Per Tube TID  . clopidogrel  75 mg Per Tube Daily  . famotidine  20 mg Per Tube Q12H  . feeding supplement (PRO-STAT SUGAR FREE 64)  30 mL Per Tube TID  . free water  200 mL Per Tube Q6H  . gabapentin  200 mg Per Tube QHS  . mouth rinse  15 mL Mouth Rinse QID  . metoprolol tartrate  10 mg Per Tube BID  . multivitamin with minerals  1 tablet Per Tube Daily  . pravastatin  20 mg Per Tube q1800  . thiamine  100 mg Per Tube Daily   Continuous Infusions: . feeding supplement (JEVITY 1.2 CAL) 1,000 mL (01/07/17 0306)     LOS: 16 days    Time spent: 40 minutes    Tzion Wedel, Geraldo Docker, MD [facility] Pager [phone]  If 7PM-7AM, please contact night-coverage [email or web address] Password [email or web address] 01/07/2017,  12:12 PM

## 2017-01-08 DIAGNOSIS — I69391 Dysphagia following cerebral infarction: Secondary | ICD-10-CM

## 2017-01-08 LAB — GLUCOSE, CAPILLARY
Glucose-Capillary: 107 mg/dL — ABNORMAL HIGH (ref 65–99)
Glucose-Capillary: 101 mg/dL — ABNORMAL HIGH (ref 65–99)
Glucose-Capillary: 98 mg/dL (ref 65–99)
Glucose-Capillary: 121 mg/dL — ABNORMAL HIGH (ref 65–99)
Glucose-Capillary: 137 mg/dL — ABNORMAL HIGH (ref 65–99)
Glucose-Capillary: 134 mg/dL — ABNORMAL HIGH (ref 65–99)

## 2017-01-08 LAB — URINE CULTURE: Culture: NO GROWTH

## 2017-01-08 MED ORDER — METOPROLOL TARTRATE 5 MG/5ML IV SOLN
2.5000 mg | Freq: Once | INTRAVENOUS | Status: AC
  Administered 2017-01-08: 2.5 mg via INTRAVENOUS
  Filled 2017-01-08: qty 5

## 2017-01-08 MED ORDER — METOPROLOL TARTRATE 25 MG/10 ML ORAL SUSPENSION
12.5000 mg | Freq: Two times a day (BID) | ORAL | Status: DC
  Administered 2017-01-08 – 2017-01-09 (×2): 12.5 mg
  Filled 2017-01-08 (×3): qty 10

## 2017-01-08 NOTE — Care Management Important Message (Signed)
Important Message  Patient Details  Name: Scott Rivera MRN: [id number] Date of Birth: 10/22/1950   Medicare Important Message Given:  Yes    Nathen May 01/08/2017, 9:37 AM

## 2017-01-08 NOTE — Progress Notes (Signed)
Physical Therapy Treatment Patient Details Name: Scott Rivera MRN: [id number] DOB: 05-06-1950 Today's Date: 01/08/2017    History of Present Illness Pt adm with acute hypoxic respiratory failure.  PMH - cerebellar CVA [name] 2018, trach, PEG, recurrent aspiration PNA , sepsis, ckd, htn    PT Comments    Awake and tolerated treatment well. No significant change from evaluation on 9/21.   Follow Up Recommendations  SNF     Equipment Recommendations  Other (comment) (To be assessed at next venue)    Recommendations for Other Services       Precautions / Restrictions Precautions Precautions: Fall Required Braces or Orthoses: Other Brace/Splint Other Brace/Splint: bil prevalon boots Restrictions Weight Bearing Restrictions: No    Mobility  Bed Mobility Overal bed mobility: Needs Assistance Bed Mobility: Supine to Sit;Sit to Supine     Supine to sit: +2 for physical assistance;Total assist;HOB elevated Sit to supine: +2 for physical assistance;Total assist   General bed mobility comments: Assist with all aspects  Transfers                    Ambulation/Gait                 Stairs            Wheelchair Mobility    Modified Rankin (Stroke Patients Only)       Balance Overall balance assessment: Needs assistance Sitting-balance support: Bilateral upper extremity supported;Feet supported Sitting balance-Leahy Scale: Poor Sitting balance - Comments: Pt sat EOB with max to min assist. Pt required max assist initially. Worked on trunk rotation, trunk flex/ext, and trunk lateral leans. Then pt able to sit with mod assist. When pt placed in rt lateral lean and propped on rt elbow he required min assist to maintain. Postural control: Left lateral lean;Posterior lean                                  Cognition Arousal/Alertness: Awake/alert Behavior During Therapy: Flat affect Overall Cognitive Status: Difficult to assess                                  General Comments: Pt did not follow any commands today. Rt neglect. Lt gaze preference      Exercises      General Comments General comments (skin integrity, edema, etc.): HR to 120's with activity      Pertinent Vitals/Pain Pain Assessment: Faces Faces Pain Scale: No hurt    Home Living                      Prior Function            PT Goals (current goals can now be found in the care plan section) Progress towards PT goals: Not progressing toward goals - comment    Frequency    Min 2X/week      PT Plan Current plan remains appropriate    Co-evaluation              AM-PAC PT "6 Clicks" Daily Activity  Outcome Measure  Difficulty turning over in bed (including adjusting bedclothes, sheets and blankets)?: Unable Difficulty moving from lying on back to sitting on the side of the bed? : Unable Difficulty sitting down on and standing up from a chair with arms (e.g., wheelchair, bedside  commode, etc,.)?: Unable Help needed moving to and from a bed to chair (including a wheelchair)?: Total Help needed walking in hospital room?: Total Help needed climbing 3-5 steps with a railing? : Total 6 Click Score: 6    End of Session Equipment Utilized During Treatment: Oxygen (trach collar) Activity Tolerance: Patient tolerated treatment well Patient left: in bed;with call bell/phone within reach;with bed alarm set;with family/visitor present Nurse Communication: Mobility status PT Visit Diagnosis: Other abnormalities of gait and mobility (R26.89);Muscle weakness (generalized) (M62.81);Other symptoms and signs involving the nervous system (R29.898)     Time: 1349-1405 PT Time Calculation (min) (ACUTE ONLY): 16 min  Charges:  $Therapeutic Activity: 8-22 mins                    G Codes:       [facility] PT [place] 01/08/2017, 3:05 PM

## 2017-01-08 NOTE — Progress Notes (Signed)
[facility] TEAM 1 - Stepdown/ICU TEAM  CORDEL Rivera  ACZ:[id number] DOB: [birth year] DOA: 12/22/2016 PCP: Patient, No Pcp Per    Brief Narrative:  66 y/o M who presented with acute hypoxic respiratory failure after he was found with increased secretions and thick yellow sputum.  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   He was admitted 8/25 > 9/6 for sepsis in the setting of aspiration PNA w/ AKI.       In the ED a CXR showed LLL mucus plugging with cut off sign and airspace disease.  Attempts at suctioning the patient were made without improvement in saturations.  Uncuffed trach was changed to a cuffed and the patient was placed on mechanical ventilation.  Significant Events: 9/06  Discharge after admit with aspiration PNA, AKI 9/07  Re-admit with acute hypoxic respiratory failure due to mucus plugging  9/12 [facility] assumed care   Subjective: Alert - follows examiner w/ his eyes.  Does not attempt to communicate w/ me.  No resp distress or evidence of pain.     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 total of abx tx - trach care per PCCM - appears to be stabilizing on trach collar alone presently - wean O2 as able   Large L loculated pleural effusion  Progressive since admit - agree pt is not appropriate for VATS - decision has been made to monitor this for now w/ no attempts at intervention - appears stable off abx - f/u WBC in AM   HTN BP controlled   CKD Stage 3 crt is stable   Recent Labs Lab 01/02/17 0330 01/04/17 0320 01/05/17 0841 01/06/17 1152 01/07/17 1154  CREATININE 1.07 0.85 0.90 1.00 1.05    Dysphagia - Chronic PEG tube - Severe protein malnutrition Cont tube feeds via PEG   Anemia of critical illness and chronic disease Has been transfused  1U PRBC this admit - no evidence of blood loss - suspect anemia due to recurring critical illness, poor nutritional status, and CKD - Hgb stable at this time  Recent Labs Lab 01/04/17 0320 01/05/17 0841 01/06/17 1152  HGB 8.6* 10.0* 8.9*    Hx of cerebellar CVA with aphasia This has been a devastating event w/ no measurable recovery noted apart from pt regaining consciousness - Palliative Care has met with his sister again, who re-affirms her unwavering desire to continue full aggressive medical care   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 rate (!) 29, height 5' 11" (1.803 m), weight 66.8 kg (147 lb 4.3 oz), SpO2 96 %.  Intake/Output Summary (Last 24 hours) at 01/08/17 1613 Last data filed at 01/08/17 1258  Gross per 24 hour  Intake              525 ml  Output  1450 ml  Net             -925 ml   Filed Weights   01/06/17 0333 01/07/17 0226 01/08/17 0119  Weight: 68.5 kg (151 lb 0.2 oz) 68.7 kg (151 lb 7.3 oz) 66.8 kg (147 lb 4.3 oz)    Examination: General: No acute respiratory distress  Lungs: poor air movement in L base w/o change Cardiovascular: RRR  Abdomen: Nondistended, soft, BS+ Extremities: No edema B LE   CBC:  Recent Labs Lab 01/04/17 0320 01/05/17 0841 01/06/17 1152  WBC 14.8* 16.6* 17.3*  NEUTROABS  --  12.1* 13.0*  HGB 8.6* 10.0* 8.9*  HCT 28.4* 33.3* 29.5*  MCV 92.2 92.5 92.2  PLT 732* 842* 536*   Basic Metabolic Panel:  Recent Labs Lab 01/02/17 0330 01/04/17 0320 01/05/17 0841 01/06/17 1152  01/07/17 1154  NA 135 135 137 137 139  K 4.2 4.2 4.5 4.3 4.2  CL 101 102 103 102 105  CO2 _0 GLUCOSE 106* 103* 111* 92 126*  BUN 33* 31* 34* 39* 39*  CREATININE 1.07 0.85 0.90 1.00 1.05  CALCIUM 10.0 10.5* 10.8* 10.6* 10.7*  MG  --   --  2.0 2.1 2.1   GFR: Estimated Creatinine Clearance: 65.4 mL/min (by C-G formula based on SCr of 1.05 mg/dL).  Liver Function Tests:  Recent Labs Lab 01/07/17 1154  AST 19  ALT 28  ALKPHOS 105  BILITOT 0.4  PROT 7.9  ALBUMIN 2.0*    HbA1C: Hgb A1c MFr Bld  Date/Time Value Ref Range Status  12/09/2016 02:19 PM 5.7 (H) 4.8 - 5.6 % Final    Comment:    (NOTE) Pre diabetes:          5.7%-6.4% Diabetes:              >6.4% Glycemic control for   <7.0% adults with diabetes   10/21/2016 02:53 AM 5.4 4.8 - 5.6 % Final    Comment:    (NOTE)         Pre-diabetes: 5.7 - 6.4         Diabetes: >6.4         Glycemic control for adults with diabetes: <7.0     CBG:  Recent Labs Lab 01/07/17 1653 01/07/17 1936 01/07/17 2332 01/08/17 0326 01/08/17 1253  GLUCAP 86 99 96 107* 98    Scheduled Meds: . amLODipine  10 mg Per Tube Daily  . aspirin  325 mg Per Tube Daily  . chlorhexidine gluconate (MEDLINE KIT)  15 mL Mouth Rinse BID  . cloNIDine  0.1 mg Per Tube TID  . clopidogrel  75 mg Per Tube Daily  . famotidine  20 mg Per Tube Q12H  . feeding supplement (PRO-STAT SUGAR FREE 64)  30 mL Per Tube TID  . free water  200 mL Per Tube Q6H  . gabapentin  200 mg Per Tube QHS  . mouth rinse  15 mL Mouth Rinse QID  . metoprolol tartrate  10 mg Per Tube BID  . multivitamin with minerals  1 tablet Per Tube Daily  . pravastatin  20 mg Per Tube q1800  . thiamine  100 mg Per Tube Daily     LOS: 17 days   Cherene Altes, MD [facility] Office  [phone] Pager - Text Page per Amion as per below:  On-Call/Text Page:      [email or web address]      password [email or web address]  If 7PM-7AM, please contact  night-coverage [email or web address] Password [email or web address] 01/08/2017, 4:13 PM

## 2017-01-08 NOTE — Progress Notes (Signed)
CSW following for DC to SNF when stable- submitted for insurance auth- awaiting determination  Jorge Ny, [place] Social Worker [phone]

## 2017-01-08 NOTE — Plan of Care (Signed)
Problem: Education: Goal: Knowledge of [place] [facility] Education information/materials will improve Outcome: Progressing Discussed with patient about plan of care for the evening, PEG tube and tube feeding with some teach back displayed

## 2017-01-09 ENCOUNTER — Inpatient Hospital Stay (HOSPITAL_COMMUNITY): Payer: Medicare HMO

## 2017-01-09 ENCOUNTER — Encounter (HOSPITAL_COMMUNITY): Payer: Self-pay | Admitting: Radiology

## 2017-01-09 DIAGNOSIS — Z930 Tracheostomy status: Secondary | ICD-10-CM

## 2017-01-09 DIAGNOSIS — J9620 Acute and chronic respiratory failure, unspecified whether with hypoxia or hypercapnia: Secondary | ICD-10-CM

## 2017-01-09 DIAGNOSIS — Z8673 Personal history of transient ischemic attack (TIA), and cerebral infarction without residual deficits: Secondary | ICD-10-CM

## 2017-01-09 HISTORY — PX: IR PATIENT EVAL TECH 0-60 MINS: IMG5564

## 2017-01-09 LAB — CBC
HCT: 31.6 % — ABNORMAL LOW (ref 39.0–52.0)
Hemoglobin: 9.8 g/dL — ABNORMAL LOW (ref 13.0–17.0)
MCH: 28.6 pg (ref 26.0–34.0)
MCHC: 31 g/dL (ref 30.0–36.0)
MCV: 92.1 fL (ref 78.0–100.0)
Platelets: 845 10*3/uL — ABNORMAL HIGH (ref 150–400)
RBC: 3.43 MIL/uL — ABNORMAL LOW (ref 4.22–5.81)
RDW: 16.4 % — ABNORMAL HIGH (ref 11.5–15.5)
WBC: 18.7 10*3/uL — ABNORMAL HIGH (ref 4.0–10.5)

## 2017-01-09 LAB — PROCALCITONIN: Procalcitonin: 0.3 ng/mL

## 2017-01-09 LAB — GLUCOSE, CAPILLARY
Glucose-Capillary: 100 mg/dL — ABNORMAL HIGH (ref 65–99)
Glucose-Capillary: 108 mg/dL — ABNORMAL HIGH (ref 65–99)
Glucose-Capillary: 82 mg/dL (ref 65–99)
Glucose-Capillary: 112 mg/dL — ABNORMAL HIGH (ref 65–99)
Glucose-Capillary: 122 mg/dL — ABNORMAL HIGH (ref 65–99)
Glucose-Capillary: 136 mg/dL — ABNORMAL HIGH (ref 65–99)

## 2017-01-09 MED ORDER — FREE WATER
100.0000 mL | Status: DC
  Administered 2017-01-10 (×5): 100 mL

## 2017-01-09 MED ORDER — METOPROLOL TARTRATE 25 MG/10 ML ORAL SUSPENSION
25.0000 mg | Freq: Two times a day (BID) | ORAL | Status: DC
  Administered 2017-01-09: 12.5 mg
  Administered 2017-01-10: 25 mg
  Filled 2017-01-09: qty 10

## 2017-01-09 NOTE — Care Management Note (Signed)
Case Management Note  Patient Details  Name: Scott Rivera MRN: [id number] Date of Birth: [birth year]  Subjective/Objective:  Presents with  hx of severe cerebellar CVA / non-verbal status, SNF resident with trach / PEG readmitted 9/7 (after d/c 9/6) for worsening of hypoxic respiratory failure. CXR concerning for increased L sided airspace disease and mucus plugging . NCM spoke with patient sister, [name], she states she was thinking about taking patient home with her at dc.  She states she can do just as good a job as the skilled facility.  She asked if I could help with PCS services, NCM gave her the application for PCS services and explained to her that this will have to be filled out by the patient's PCP.  She states he goes to an Urgent Care,  NCM informed her that not sure if Urgent Care can fill the app out. NCM informed her to check with [facility] and [facility] services as well for assistance, NCM gave her the phone number for [facility] and PCS. [name] home phone is [phone] and her cell is [phone]. CSW working on SNF.  [name] is hoping she could get pcs services for some hours but there is a waiting list for these services.  9/18 [place], BSN - NCM spoke with patient's sister, asked if she thought she would be able to handle patient at home,  NCM informed her that a [facility] will not come out everyday to do trach care, they will show her how to do this and she will be responsible for taking care of that, he is also on tube feeds.  She continued to state she wants to take patient home, she chose [facility] for [facility],   [place] spoke with [facility] for [facility] services, they faxed the form to NCM to be filled out by MD and faxed back 48 hrs prior to discharge so that it can be expedited  - fax number is [phone].  NCM informed RN to let sister get involved in his care so she will know what is expected of her, when RN tried to get her involved in turning patient, she did  not assist RN, she soon left.  CSW conts to follow along in case sister changes her mind about taking him home.    9/20 [place], BSN - NCM spoke with patient sister again, with CSW and informed her that think SNF is safer for patient, if she takes him home she will be doing the trach care, wound care and tube feeding her self, the [facility] will show her what to do but she will be responsible for doing this care, she then states she will try the SNF one more time, CSW will facilitate SNF placement      9/25 1236 Tomi Bamberger RN, BSN -  Plan is for dc to snf tomorrow per MD,  After peg tube change.          Action/Plan: NCM will follow along with CSW.   Expected Discharge Date:                  Expected Discharge Plan:  Skilled Nursing Facility  In-House Referral:  Clinical Social Work  Discharge planning Services  CM Consult  Post Acute Care Choice:    Choice offered to:     DME Arranged:    DME Agency:     HH Arranged:    [place]  Agency:     Status of Service:  Completed, signed off  If discussed at [place] of Stay Meetings, dates discussed:    Additional Comments:  Zenon Mayo, RN 01/09/2017, 12:36 PM

## 2017-01-09 NOTE — Progress Notes (Signed)
PULMONARY / CRITICAL CARE MEDICINE   Name: Scott Rivera MRN: [id number] DOB: 1950/06/30    ADMISSION DATE:  12/22/2016 CONSULTATION DATE:  12/22/16  REFERRING MD:  Dr. Roderic Palau / EDP   CHIEF COMPLAINT:  Low O2 saturations   HISTORY OF PRESENT ILLNESS:   66 y/o M who presented with acute hypoxic respiratory failure.  Baseline nonverbal, trach / PEG and lives in a SNF since July 2018 after CVA.   Recent admit from 8/25-9/6 for sepsis in the setting of aspiration PNA, AKI. He was discharged 9/6 to a SNF for care.  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      The patient returns 9/7 with reports of low oxygen saturations (60's) at the SNF.  He was reportedly found with increased secretions, thick yellow sputum and tachycardia.  On arrival to the ER, the patient vomited on the staff.  Initial labs - Na 137, K 4.4, Cl 100, CO2 26, glucose 128, BUN 33, Sr Cr 1.10, alk phos 171, albumin 2.0, AST 25, ALT 39, T. Bilirubin 0.7, lactic acid 1.31, WBC 24.9, Hgb 10 and platelets 841.  CXR showed LLL mucus plugging with cut off sign and airspace disease.  Attempts at suctioning the patient were made without improvement in saturations.  Uncuffed trach was changed to a cuffed and the patient was placed on mechanical ventilation.  PCCM called for ICU admission.   SUBJECTIVE:   No acute events.   VITAL SIGNS: BP 136/64 (BP Location: Right Arm)   Pulse 92   Temp 98.5 F (36.9 C) (Oral)   Resp (!) 24   Ht 5' 11"  (1.803 m)   Wt 147 lb 4.3 oz (66.8 kg)   SpO2 95%   BMI 20.54 kg/m   VENTILATOR SETTINGS: FiO2 (%):  [28 %] 28 %  INTAKE / OUTPUT:  Intake/Output Summary (Last 24 hours) at 01/09/17 1021 Last data filed at 01/09/17 0600  Gross per 24 hour  Intake          1611.67 ml  Output              750 ml  Net           861.67 ml     PHYSICAL  EXAMINATION: General:  Wasted male , NAD on t collar HEENT: Trach with copious white secretions  TMH:DQQI affect Neuro: no follows commands CV: HSD PULM:Coarse rhonchi bilat WL:NLGX, non-tender, bsx4 active , peg Extremities: warm/dry, - edema  Skin: no rashes or lesions   LABS:  BMET  Recent Labs Lab 01/05/17 0841 01/06/17 1152 01/07/17 1154  NA 137 137 139  K 4.5 4.3 4.2  CL 103 102 105  CO2 27 28 26   BUN 34* 39* 39*  CREATININE 0.90 1.00 1.05  GLUCOSE 111* 92 126*    Electrolytes  Recent Labs Lab 01/05/17 0841 01/06/17 1152 01/07/17 1154  CALCIUM 10.8* 10.6* 10.7*  MG 2.0 2.1 2.1    CBC  Recent Labs Lab 01/05/17 0841 01/06/17 1152 01/09/17 0646  WBC 16.6* 17.3* 18.7*  HGB 10.0* 8.9* 9.8*  HCT 33.3* 29.5* 31.6*  PLT 842* 800* 845*    Coag's No results for input(s): APTT, INR in the last 168 hours.  Sepsis Markers  Recent Labs Lab 01/07/17 1154 01/07/17 1452 01/09/17 0646  LATICACIDVEN 0.9 0.9  --   PROCALCITON 0.17  --  0.30    ABG No results for input(s): PHART, PCO2ART, PO2ART in the last 168 hours.  Liver Enzymes  Recent Labs Lab 01/07/17 1154  AST 19  ALT 28  ALKPHOS 105  BILITOT 0.4  ALBUMIN 2.0*    Cardiac Enzymes No results for input(s): TROPONINI, PROBNP in the last 168 hours.  Glucose  Recent Labs Lab 01/08/17 1253 01/08/17 1626 01/08/17 1930 01/08/17 2329 01/09/17 0357 01/09/17 0752  GLUCAP 98 121* 137* 134* 100* 108*    Imaging No results found.   STUDIES:  CT Chest 9/14 >> LLL consolidation suspicious for PNA, superimposed moderate layering / partially loculated left pleural effusion increased since July, right atelectasis, trach in good position  CULTURES: BCx2 9/7 >> negative   Tracheal Aspirate 9/7 >> (not sent)   ANTIBIOTICS: Vanco 9/7 >> 9/13 Zosyn 9/7 >> 9/13  SIGNIFICANT EVENTS: 9/06  Discharge after admit with aspiration PNA, AKI 9/07  Re-admit with acute hypoxic respiratory  failure  9//19 ct guided chest tube on hold.  LINES/TUBES: Trach (cuffed changed) 9/7 >>  DISCUSSION: 66 y/o M with hx of severe cerebellar CVA / non-verbal status, SNF resident with trach / PEG readmitted 9/7 (after d/c 9/6) for worsening of hypoxic respiratory failure.  CXR concerning for increased L sided airspace disease and mucus plugging.  Off vent but now has large left loculated effusion.  CT chest demonstrates consolidation / partially loculated effusion 9/14.   Now with cuff less trach. Awaits LTAC   ASSESSMENT / PLAN:  Acute on chronic respiratory failure. Aspiration pneumonia with mucus plugging (NOS) SMALL Left loculated pleural effusion , no tx Tracheostomy status. P: Continue ATC as tolerated ( 28% with 100% sats 9/25) now with cuff less trach Patient is not a candidate for VATS  Duoneb Q 6 Suction prn Trach care with inner cannula change  Awaits LTAC placement    Trach care per PCCM, we will see weekly All other care per Primary Team  DVT prophylaxis - SQ heparin SUP - pepcid Nutrition - tube feeds Goals of care - full code Family - sister not at bedside.  Richardson Landry Tran Randle ACNP Maryanna Shape PCCM Pager [phone] till 3 pm If no answer page [phone] 01/09/2017, 10:22 AM  01/09/2017 10:21 AM

## 2017-01-09 NOTE — Plan of Care (Signed)
Problem: Education: Goal: Knowledge of [place] [facility] Education information/materials will improve Outcome: Progressing Discussed plan of care for the evening, trach care and pain management with some teach back displayed.  Patient denied pain but did accept offer for a warm blanket.

## 2017-01-09 NOTE — Progress Notes (Signed)
PROGRESS NOTE    Scott Rivera  NHA:[id number] DOB: 1950-12-13 DOA: 12/22/2016 PCP: Patient, No Pcp Per   Brief Narrative:  66 y/o M  PMHx  Severe Cerebellar CVA, HTN, CKD stage III, anemia due to CKD,   Presented with acute hypoxic respiratory failure after he was found with increased secretions and thick yellow sputum.  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    He was admitted 8/25 > 9/6 for sepsis in the setting of aspiration PNA w/ AKI.        In the ED a CXR showed LLL mucus plugging with cut off sign and airspace disease.  Attempts at suctioning the patient were made without improvement in saturations.  Uncuffed trach was changed to a cuffed and the patient was placed on mechanical ventilation.    Subjective: 9/25 A/O 0 eyes open does not follow commands.     Assessment & Plan:   Active Problems:   Cerebellar infarct [facility])   Essential hypertension   Pressure injury of skin   Dysphagia, post-stroke   PNA (pneumonia)   Loculated pleural effusion  Acute on chronic respiratory failure with hypoxia -Completed 10 day course antibiotics per [facility]  LEFT loculated Pleural Effusion -Not appropriate for VATS -Per Dr. Garen Lah note 9/19 after discussion with PCCM IR guided thoracentesis canceled  Essential HTN   -Amlodipine 10 mg daily -clonidine 0.1 TID (if increase to metoprolol does not control BP increase clonidine) -9/25 increase Metoprolol 10 mg BID   CKD stage III     Recent Labs Lab 01/04/17 0320 01/05/17 0841 01/06/17 1152 01/07/17 1154  CREATININE 0.85 0.90 1.00 1.05  -Baseline     Dysphagia/PEG tube occlusion -Chronic PEG tube. Spoke at length with IR concerning changing out PEG tube which keeps occluding every 72 hours. Unfortunately patient has a pull-through PEG tube which has a built-in stopper on the end. Dr. Kathlene Cote strongly opposes changing out this type of PEG  tube, as it is the Cadillac of PEG tube is. Feels any other PEG tube would be inferior. -Increase frequency of PEG tube flush. Free water 119m q 3 hours. -IR will supply [place] PEG tube brush for patient to be discharged with for dislodging occlusions  Severe protein calorie malnutrition  -continue tube feeds  Anemia of critical illness/chronic disease  -9/10 transfused 1 unit PRBC -Negative evidence acute blood loss. -Anemia most likely secondary to recurring critical illness, poor attention status, and CKD     Recent Labs Lab 01/04/17 0320 01/05/17 0841 01/06/17 1152 01/09/17 0646  HGB 8.6* 10.0* 8.9* 9.8*  -Stable   Leukocytosis/Sepsis?   Recent Labs Lab 01/04/17 0320 01/05/17 0841 01/06/17 1152 01/09/17 0646  WBC 14.8* 16.6* 17.3* 18.7*  -although afebrile, negative bands, negative left shift patient's WBCs continue to climb. Possible sites of infection: Loculated pleural effusion vs sacral decubitus ulcer, urine  -Blood cultureNGTD -Urine negative, -Decubitus ulcer stage I unlikely side of infection -Large left Loculated effusion most likely site of infection/inflammation. Has been decided not to pursue aggressive treatment.   Cerebellar CVA with aphasia   -Devastating event W/no measurable recovery noted apart from patient regaining consciousness.  -PALLIATIVE care met with sister who again states continue full aggressive medical care. Poor outcome likely. Sister does not understand patient will more than likely be in and out of the hospital frequently in the next coming months  Stage  1 sacral decubitus ulcer -Present prior to admission -Continue care per WOC suggestions     DVT prophylaxis: SCD/subcutaneous heparin Code Status: Full Family Communication:  None Disposition Plan: Initially thinking patient may be ready for discharge today however unexplained leukocytosis.    Consultants:  [facility]    Procedures/Significant Events:  9/06  Discharge after  admit with aspiration PNA, AKI 9/07  Re-admit with acute hypoxic respiratory failure due to mucus plugging  9/10 transfused 1 unit PRBC 9/12 [facility] assumed care  9/17 ultrasound chest: LEFT small loculated pleural effusion 9/21 change to #6 cuffless trach    I have personally reviewed and interpreted all radiology studies and my findings are as above.  VENTILATOR SETTINGS: TC FiO2: 28%    Cultures 9/7 blood negative 9/23 blood NGTD 9/23 urine Negative    Antimicrobials: Anti-infectives    Start     Dose/Rate Stop   12/30/16 1400  piperacillin-tazobactam (ZOSYN) IVPB 3.375 g     3.375 g 12.5 mL/hr over 240 Minutes 01/02/17 1855   12/26/16 0100  vancomycin (VANCOCIN) 500 mg in sodium chloride 0.9 % 100 mL IVPB     500 mg 100 mL/hr over 60 Minutes 12/28/16 0344   12/22/16 2200  vancomycin (VANCOCIN) IVPB 750 mg/150 ml premix  Status:  Discontinued     750 mg 150 mL/hr over 60 Minutes 12/25/16 2236   12/22/16 1800  piperacillin-tazobactam (ZOSYN) IVPB 3.375 g  Status:  Discontinued     3.375 g 12.5 mL/hr over 240 Minutes 12/28/16 1934   12/22/16 1200  vancomycin (VANCOCIN) IVPB 1000 mg/200 mL premix     1,000 mg 200 mL/hr over 60 Minutes 12/22/16 1430   12/22/16 1200  piperacillin-tazobactam (ZOSYN) IVPB 3.375 g     3.375 g 100 mL/hr over 30 Minutes 12/22/16 1330       Devices    LINES / TUBES:  #6 Cuffless trach 9/21>>    Continuous Infusions: . feeding supplement (JEVITY 1.2 CAL) 1,000 mL (01/08/17 2353)     Objective: Vitals:   01/09/17 0728 01/09/17 0748 01/09/17 1039 01/09/17 1043  BP: (!) 170/67 136/64 139/76 139/76  Pulse: 100 92  89  Resp: (!) 30 (!) 24    Temp:  98.5 F (36.9 C)    TempSrc:  Oral    SpO2: 97% 95%    Weight:      Height:        Intake/Output Summary (Last 24 hours) at 01/09/17 1048 Last data filed at 01/09/17 0600  Gross per 24 hour  Intake          1611.67 ml  Output              750 ml  Net           861.67 ml    Filed Weights   01/07/17 0226 01/08/17 0119 01/09/17 0311  Weight: 151 lb 7.3 oz (68.7 kg) 147 lb 4.3 oz (66.8 kg) 147 lb 4.3 oz (66.8 kg)    Physical Exam:  General: Showed 0, eyes open, not following commands, positive chronic respiratory distress Neck:  Negative scars, masses, torticollis, lymphadenopathy, JVD Lungs: RUL/RLL/LUL clear to auscultation. Decrease/absent breath sounds LLL, negative wheezes or crackles  Cardiovascular: Regular rate and rhythm without murmur gallop or rub normal S1 and S2 Abdomen: negative abdominal pain, nondistended, positive soft, bowel sounds, no rebound, no ascites, no appreciable mass, PEG tube present negative sign of infection Extremities: No significant cyanosis, clubbing, or edema bilateral lower extremities  Skin: Stage I sacral decubitus ulcer Psychiatric:  Unable to evaluate secondary to altered mental status (CVA)  Central nervous system:  Eyes open, tracks you around room, will not follow commands.     Data Reviewed: Care during the described time interval was provided by me .  I have reviewed this patient's available data, including medical history, events of note, physical examination, and all test results as part of my evaluation.   CBC:  Recent Labs Lab 01/04/17 0320 01/05/17 0841 01/06/17 1152 01/09/17 0646  WBC 14.8* 16.6* 17.3* 18.7*  NEUTROABS  --  12.1* 13.0*  --   HGB 8.6* 10.0* 8.9* 9.8*  HCT 28.4* 33.3* 29.5* 31.6*  MCV 92.2 92.5 92.2 92.1  PLT 732* 842* 800* 010*   Basic Metabolic Panel:  Recent Labs Lab 01/04/17 0320 01/05/17 0841 01/06/17 1152 01/07/17 1154  NA 135 137 137 139  K 4.2 4.5 4.3 4.2  CL 102 103 102 105  CO2 26 27 28 26   GLUCOSE 103* 111* 92 126*  BUN 31* 34* 39* 39*  CREATININE 0.85 0.90 1.00 1.05  CALCIUM 10.5* 10.8* 10.6* 10.7*  MG  --  2.0 2.1 2.1   GFR: Estimated Creatinine Clearance: 65.4 mL/min (by C-G formula based on SCr of 1.05 mg/dL). Liver Function Tests:  Recent  Labs Lab 01/07/17 1154  AST 19  ALT 28  ALKPHOS 105  BILITOT 0.4  PROT 7.9  ALBUMIN 2.0*   No results for input(s): LIPASE, AMYLASE in the last 168 hours. No results for input(s): AMMONIA in the last 168 hours. Coagulation Profile: No results for input(s): INR, PROTIME in the last 168 hours. Cardiac Enzymes: No results for input(s): CKTOTAL, CKMB, CKMBINDEX, TROPONINI in the last 168 hours. BNP (last 3 results) No results for input(s): PROBNP in the last 8760 hours. HbA1C: No results for input(s): HGBA1C in the last 72 hours. CBG:  Recent Labs Lab 01/08/17 1626 01/08/17 1930 01/08/17 2329 01/09/17 0357 01/09/17 0752  GLUCAP 121* 137* 134* 100* 108*   Lipid Profile: No results for input(s): CHOL, HDL, LDLCALC, TRIG, CHOLHDL, LDLDIRECT in the last 72 hours. Thyroid Function Tests: No results for input(s): TSH, T4TOTAL, FREET4, T3FREE, THYROIDAB in the last 72 hours. Anemia Panel: No results for input(s): VITAMINB12, FOLATE, FERRITIN, TIBC, IRON, RETICCTPCT in the last 72 hours. Urine analysis:    Component Value Date/Time   COLORURINE AMBER (A) 12/16/2016 1625   APPEARANCEUR CLEAR 12/16/2016 1625   LABSPEC 1.018 12/16/2016 1625   PHURINE 5.0 12/16/2016 1625   GLUCOSEU NEGATIVE 12/16/2016 1625   HGBUR NEGATIVE 12/16/2016 1625   BILIRUBINUR NEGATIVE 12/16/2016 1625   KETONESUR NEGATIVE 12/16/2016 1625   PROTEINUR 30 (A) 12/16/2016 1625   NITRITE NEGATIVE 12/16/2016 1625   LEUKOCYTESUR NEGATIVE 12/16/2016 1625   Sepsis Labs: @LABRCNTIP (procalcitonin:4,lacticidven:4)  ) Recent Results (from the past 240 hour(s))  Culture, Urine     Status: None   Collection Time: 01/07/17  2:20 PM  Result Value Ref Range Status   Specimen Description URINE, CATHETERIZED  Final   Special Requests NONE  Final   Culture NO GROWTH  Final   Report Status 01/08/2017 FINAL  Final  Culture, blood (routine x 2)     Status: None (Preliminary result)   Collection Time: 01/07/17  2:55  PM  Result Value Ref Range Status   Specimen Description BLOOD BLOOD LEFT HAND  Final   Special Requests   Final    BOTTLES DRAWN AEROBIC AND ANAEROBIC Blood Culture adequate volume  Culture NO GROWTH < 24 HOURS  Final   Report Status PENDING  Incomplete  Culture, blood (routine x 2)     Status: None (Preliminary result)   Collection Time: 01/07/17  3:00 PM  Result Value Ref Range Status   Specimen Description BLOOD BLOOD RIGHT HAND  Final   Special Requests IN PEDIATRIC BOTTLE Blood Culture adequate volume  Final   Culture NO GROWTH < 24 HOURS  Final   Report Status PENDING  Incomplete         Radiology Studies: Dg Chest Port 1 View  Result Date: 01/07/2017 CLINICAL DATA:  Loculated pleural effusion EXAM: PORTABLE CHEST 1 VIEW COMPARISON:  01/02/2017 FINDINGS: Left lower lobe consolidation unchanged. Partially loculated left pleural effusion unchanged. Mild right lower lobe airspace disease unchanged. No significant effusion on the right. Tracheostomy in good position. IMPRESSION: No interval change. Left lower lobe consolidation and partially loculated left effusion. Electronically Signed   By: Franchot Gallo M.D.   On: 01/07/2017 13:55        Scheduled Meds: . amLODipine  10 mg Per Tube Daily  . aspirin  325 mg Per Tube Daily  . chlorhexidine gluconate (MEDLINE KIT)  15 mL Mouth Rinse BID  . cloNIDine  0.1 mg Per Tube TID  . clopidogrel  75 mg Per Tube Daily  . famotidine  20 mg Per Tube Q12H  . feeding supplement (PRO-STAT SUGAR FREE 64)  30 mL Per Tube TID  . free water  200 mL Per Tube Q6H  . gabapentin  200 mg Per Tube QHS  . mouth rinse  15 mL Mouth Rinse QID  . metoprolol tartrate  12.5 mg Per Tube BID  . multivitamin with minerals  1 tablet Per Tube Daily  . pravastatin  20 mg Per Tube q1800  . thiamine  100 mg Per Tube Daily   Continuous Infusions: . feeding supplement (JEVITY 1.2 CAL) 1,000 mL (01/08/17 2353)     LOS: 18 days    Time spent: 40  minutes    Trevaughn Schear, Geraldo Docker, MD [facility] Pager [phone]   If 7PM-7AM, please contact night-coverage [email or web address] Password [facility] 01/09/2017, 10:48 AM

## 2017-01-09 NOTE — Progress Notes (Signed)
CSW continuing to follow- insurance still has not made determination but updated notes sent and spoke with representative  Updated pt sister that insurance is not sure they will reauthorize patient to go to SNF due to lack of improvement with therapy- sister insists patient tries to sit up and is still rehab-able- plans to call insurance to advocate  [facility] can accept patient tomorrow under Medicaid if insurance does not approve- prefers to accept in the morning so they will have their nursing director there- CSW informed MD  Jorge Ny, [place] Social Worker [phone]

## 2017-01-09 NOTE — Procedures (Signed)
Went bedside to unclogged Gastrostomy tube.  Successfully unclogged and flushed multiple times with sterile water.  Melinda Alewine RT R CV Jessica Loftis RT R

## 2017-01-10 DIAGNOSIS — R1310 Dysphagia, unspecified: Secondary | ICD-10-CM

## 2017-01-10 DIAGNOSIS — K9420 Gastrostomy complication, unspecified: Secondary | ICD-10-CM

## 2017-01-10 LAB — BASIC METABOLIC PANEL
Anion gap: 10 (ref 5–15)
BUN: 30 mg/dL — ABNORMAL HIGH (ref 6–20)
CO2: 26 mmol/L (ref 22–32)
Calcium: 10.7 mg/dL — ABNORMAL HIGH (ref 8.9–10.3)
Chloride: 102 mmol/L (ref 101–111)
Creatinine, Ser: 1.02 mg/dL (ref 0.61–1.24)
GFR calc Af Amer: >60 mL/min (ref >60)
GFR calc non Af Amer: >60 mL/min (ref >60)
Glucose, Bld: 101 mg/dL — ABNORMAL HIGH (ref 65–99)
Potassium: 4.3 mmol/L (ref 3.5–5.1)
Sodium: 138 mmol/L (ref 135–145)

## 2017-01-10 LAB — CBC WITH DIFFERENTIAL/PLATELET
Basophils Absolute: 0 K/uL (ref 0.0–0.1)
Basophils Relative: 0 %
Eosinophils Absolute: 0.4 K/uL (ref 0.0–0.7)
Eosinophils Relative: 2 %
HCT: 28.9 % — ABNORMAL LOW (ref 39.0–52.0)
Hemoglobin: 9.1 g/dL — ABNORMAL LOW (ref 13.0–17.0)
Lymphocytes Relative: 13 %
Lymphs Abs: 2.3 K/uL (ref 0.7–4.0)
MCH: 28.9 pg (ref 26.0–34.0)
MCHC: 31.5 g/dL (ref 30.0–36.0)
MCV: 91.7 fL (ref 78.0–100.0)
Monocytes Absolute: 2.2 K/uL — ABNORMAL HIGH (ref 0.1–1.0)
Monocytes Relative: 12 %
Neutro Abs: 13.2 K/uL — ABNORMAL HIGH (ref 1.7–7.7)
Neutrophils Relative %: 73 %
Platelets: 835 K/uL — ABNORMAL HIGH (ref 150–400)
RBC: 3.15 MIL/uL — ABNORMAL LOW (ref 4.22–5.81)
RDW: 16.6 % — ABNORMAL HIGH (ref 11.5–15.5)
WBC: 18.2 K/uL — ABNORMAL HIGH (ref 4.0–10.5)

## 2017-01-10 LAB — GLUCOSE, CAPILLARY
Glucose-Capillary: 138 mg/dL — ABNORMAL HIGH (ref 65–99)
Glucose-Capillary: 93 mg/dL (ref 65–99)
Glucose-Capillary: 117 mg/dL — ABNORMAL HIGH (ref 65–99)

## 2017-01-10 LAB — MAGNESIUM: Magnesium: 2 mg/dL (ref 1.7–2.4)

## 2017-01-10 MED ORDER — FREE WATER: 100.0000 mL | Status: DC

## 2017-01-10 MED ORDER — CLOPIDOGREL BISULFATE 75 MG PO TABS: 75.0000 mg | ORAL_TABLET | Freq: Every day | ORAL | Status: DC

## 2017-01-10 MED ORDER — METOPROLOL TARTRATE 25 MG/10 ML ORAL SUSPENSION: 25.0000 mg | Freq: Two times a day (BID) | ORAL | Status: DC

## 2017-01-10 MED ORDER — CLONIDINE HCL 0.1 MG PO TABS: 0.1000 mg | ORAL_TABLET | Freq: Three times a day (TID) | ORAL | 11 refills | Status: DC

## 2017-01-10 MED ORDER — THIAMINE HCL 100 MG PO TABS: 100.0000 mg | ORAL_TABLET | Freq: Every day | ORAL | Status: DC

## 2017-01-10 MED ORDER — ORAL CARE MOUTH RINSE: 15.0000 mL | Freq: Four times a day (QID) | OROMUCOSAL | 0 refills | Status: DC

## 2017-01-10 MED ORDER — IPRATROPIUM-ALBUTEROL 0.5-2.5 (3) MG/3ML IN SOLN: 3.0000 mL | RESPIRATORY_TRACT | Status: DC | PRN

## 2017-01-10 MED ORDER — JEVITY 1.2 CAL PO LIQD
1000.0000 mL | ORAL | Status: DC
  Administered 2017-01-10: 1000 mL
  Filled 2017-01-10 (×2): qty 1000

## 2017-01-10 MED ORDER — FAMOTIDINE 40 MG/5ML PO SUSR: 20.0000 mg | Freq: Two times a day (BID) | ORAL | 0 refills | Status: DC

## 2017-01-10 MED ORDER — PRAVASTATIN SODIUM 20 MG PO TABS: 20.0000 mg | ORAL_TABLET | Freq: Every day | ORAL | Status: AC

## 2017-01-10 MED ORDER — JEVITY 1.2 CAL PO LIQD: 1000.0000 mL | ORAL | 0 refills | Status: DC

## 2017-01-10 MED ORDER — ADULT MULTIVITAMIN W/MINERALS CH: 1.0000 | ORAL_TABLET | Freq: Every day | ORAL | Status: DC

## 2017-01-10 MED ORDER — ASPIRIN 325 MG PO TABS: 325.0000 mg | ORAL_TABLET | Freq: Every day | ORAL | Status: DC

## 2017-01-10 MED ORDER — AMLODIPINE BESYLATE 10 MG PO TABS: 10.0000 mg | ORAL_TABLET | Freq: Every day | ORAL | Status: DC

## 2017-01-10 MED ORDER — CHLORHEXIDINE GLUCONATE 0.12% ORAL RINSE (MEDLINE KIT): 15.0000 mL | Freq: Two times a day (BID) | OROMUCOSAL | 0 refills | Status: DC

## 2017-01-10 MED ORDER — ACETAMINOPHEN 325 MG PO TABS: 650.0000 mg | ORAL_TABLET | Freq: Four times a day (QID) | ORAL | Status: DC | PRN

## 2017-01-10 NOTE — Clinical Social Work Placement (Signed)
   CLINICAL SOCIAL WORK PLACEMENT  NOTE  Date:  01/10/2017  Patient Details  Name: Scott Rivera MRN: [id number] Date of Birth: 1951/01/29  Clinical Social Work is seeking post-discharge placement for this patient at the [place] level of care (*CSW will initial, date and re-position this form in  chart as items are completed):  Yes   Patient/family provided with [place] Work Department's list of facilities offering this level of care within the geographic area requested by the patient (or if unable, by the patient's family).  Yes   Patient/family informed of their freedom to choose among providers that offer the needed level of care, that participate in Medicare, Medicaid or managed care program needed by the patient, have an available bed and are willing to accept the patient.  Yes   Patient/family informed of [facility]'s ownership interest in [facility] and [facility], as well as of the fact that they are under no obligation to receive care at these facilities.  PASRR submitted to EDS on       PASRR number received on       Existing PASRR number confirmed on 12/21/16     FL2 transmitted to all facilities in geographic area requested by pt/family on 12/21/16     FL2 transmitted to all facilities within larger geographic area on       Patient informed that his/her managed care company has contracts with or will negotiate with certain facilities, including the following:        Yes   Patient/family informed of bed offers received.  Patient chooses bed at [facility]     Physician recommends and patient chooses bed at      Patient to be transferred to [facility] on 01/10/17.  Patient to be transferred to facility by ptar     Patient family notified on 01/10/17 of transfer.  Name of family member notified:  [name]     PHYSICIAN Please sign FL2     Additional Comment:     _______________________________________________ Jorge Ny, LCSW 01/10/2017, 11:38 AM

## 2017-01-10 NOTE — Progress Notes (Signed)
[facility] here to transport pt to SNF. Bedside report given.

## 2017-01-10 NOTE — Progress Notes (Signed)
   01/10/17 1100  Clinical Encounter Type  Visited With Patient (sister)  Visit Type Follow-up  Referral From (Financial controller)  Spiritual Encounters  Spiritual Needs Prayer;Emotional (upset about care  after leaving hospital)  Stress Factors  Patient Stress Factors (concerned with care of patient going out)  Family Stress Factors (sister feels he needs physical therapy as he goes to care fa)  Patient and sister uneasy about care plan-going to nursing facility but not for physical therapy. Sister expressed frustrations regarding him going to nursing care center but no plan for physical therapy. Listened and had prayer with patient and sister.Conard Novak, Chaplain

## 2017-01-10 NOTE — Progress Notes (Signed)
Patient will discharge to [facility] Anticipated discharge date: 9/26 Family notified: pt sister Transportation by [employer]- scheduled for 1:15pm  CSW signing off.  Jorge Ny, LCSW Clinical Social Worker [phone]

## 2017-01-10 NOTE — Discharge Summary (Signed)
DISCHARGE SUMMARY  Scott Rivera  MR#: [id number]  DOB:March 14, 1951  Date of Admission: 12/22/2016 Date of Discharge: 01/10/2017  Attending Physician:Kennesha Brewbaker T  Patient's DJM:[facility], No Pcp Per  Consults:  PCCM  Disposition: D/C to SNF   Follow-up Appts: Ongoing care will be provided by the Attending of Record at the SNF the pt has chosen   Tests Needing Follow-up: -ongoing monitoring or L loculated pleural effusion - CXR if pt becomes symptomatic w/ acute resp decline -monitoring of PEG tube w/ need to maintain meticulous free water flush schedule to minimize clogging of tube   Discharge Diagnoses: Acute on chronic hypoxic respiratory failure due to Aspiration pneumonia with mucus plugging and L pleural effusion - chronic tracheostomy Large L loculated pleural effusion  HTN CKD Stage 3 Dysphagia - Chronic PEG tube - Severe protein malnutrition 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   He was admitted 8/25 > 9/6 for sepsis in the setting of aspiration PNA w/ AKI.    In the ED a CXR showed LLL mucus plugging with cut off sign and airspace disease.  Attempts at suctioning the patient were made without improvement in saturations.  Uncuffed trach was changed to a cuffed and the patient was placed on mechanical ventilation.  Hospital Course:  Significant Events: 9/06 Discharge after admit with aspiration PNA, AKI 9/07 Re-admit with acute hypoxic respiratory failure due to mucus plugging  9/12 [facility] assumed care  9/26 cleared for d/c to SNF  Acute on chronic hypoxic respiratory failure due to  Aspiration pneumonia with mucus plugging and L pleural effusion - chronic tracheostomy completed 10+ days total of abx tx - trach care was provided per PCCM - stabilized on trach collar alone w/ FiO2 0.28 at time of d/c   Large L loculated pleural effusion  Progressive since admit - pt is not appropriate for VATS - decision was made in coordination w/ PCCM to monitor this for now w/ no attempts at intervention - appears stable off abx at time of d/c   HTN BP controlled   CKD Stage 3 crt is stable at time of d/c   Dysphagia - Chronic PEG tube - Severe protein malnutrition Cont tube feeds via PEG - multiple episodes of tube clogging were encountered during this hospital stay - Dr. Sherral Hammers spoke at length with IR concerning changing out PEG - the patient has a pull-through PEG tube which has a built-in stopper on the end - IR strongly opposed changing out this type of PEG tube, as it is the "Cadillac of PEG tubes", and IR feels any other PEG tube would be inferior - we have ncreased the  frequency of PEG tube flushes w/ free water 136m q 3 hours - IR has supplied a PEG tube brush for patient to be discharged with for dislodging occlusions  Anemia of critical illness and chronic disease Has been transfused 1U PRBC this admit - no evidence of blood loss - suspect anemia due to recurring critical illness, poor nutritional status, and CKD - Hgb stable at this time  Hx of cerebellar CVA with aphasia This has been a devastating event  w/ no measurable recovery noted apart from pt regaining consciousness - Palliative Care has met with his sister again, who re-affirms her unwavering desire to continue full aggressive medical care   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 present prior to admission - cont wound care as per WOC suggestions   Allergies as of 01/10/2017   No Known Allergies     Medication List    STOP taking these medications   amoxicillin-clavulanate 250-62.5 MG/5ML  suspension Commonly known as:  AUGMENTIN   bethanechol 10 MG tablet Commonly known as:  URECHOLINE   feeding supplement (PRO-STAT SUGAR FREE 64) Liqd   metoCLOPramide 5 MG/5ML solution Commonly known as:  REGLAN   nicotine 14 mg/24hr patch Commonly known as:  NICODERM CQ - dosed in mg/24 hours   senna 8.6 MG Tabs tablet Commonly known as:  SENOKOT   traMADol 50 MG tablet Commonly known as:  ULTRAM     TAKE these medications   acetaminophen 325 MG tablet Commonly known as:  TYLENOL Place 2 tablets (650 mg total) into feeding tube every 6 (six) hours as needed for mild pain, fever or headache.   amLODipine 10 MG tablet Commonly known as:  NORVASC Place 1 tablet (10 mg total) into feeding tube daily. What changed:  how much to take   aspirin 325 MG tablet Place 1 tablet (325 mg total) into feeding tube daily.   chlorhexidine gluconate (MEDLINE KIT) 0.12 % solution Commonly known as:  PERIDEX 15 mLs by Mouth Rinse route 2 (two) times daily.   cloNIDine 0.1 MG tablet Commonly known as:  CATAPRES Place 1 tablet (0.1 mg total) into feeding tube 3 (three) times daily. What changed:  when to take this   clopidogrel 75 MG tablet Commonly known as:  PLAVIX Place 1 tablet (75 mg total) into feeding tube daily.   famotidine 40 MG/5ML suspension Commonly known as:  PEPCID Place 2.5 mLs (20 mg total) into feeding tube every 12 (twelve) hours. What changed:  when to take this   feeding supplement (JEVITY 1.2 CAL) Liqd Place 1,000 mLs into feeding tube continuous.   free water Soln Place 100 mLs into feeding tube every 3 (three) hours. What changed:  how much to take  when to take this   gabapentin 100 MG capsule Commonly known as:  NEURONTIN Take 200 mg by mouth at bedtime.   ipratropium-albuterol 0.5-2.5 (3) MG/3ML Soln Commonly known as:  DUONEB Take 3 mLs by nebulization every 4 (four) hours as needed.   metoprolol tartrate 25 mg/10 mL Susp Commonly known  as:  LOPRESSOR Place 10 mLs (25 mg total) into feeding tube 2 (two) times daily. What changed:  how much to take   mouth rinse Liqd solution 15 mLs by Mouth Rinse route QID.   multivitamin with minerals Tabs tablet Place 1 tablet into feeding tube daily.   pravastatin 20 MG tablet Commonly known as:  PRAVACHOL Place 1 tablet (20 mg total) into feeding tube daily at 6 PM.   thiamine 100 MG tablet Place 1 tablet (100 mg total) into feeding tube daily.            Discharge Care Instructions        Start     Ordered   01/11/17 0000  amLODipine (NORVASC) 10 MG tablet  Daily     01/10/17 1101   01/11/17 0000  aspirin 325 MG tablet  Daily     01/10/17 1101   01/11/17 0000  clopidogrel (PLAVIX) 75 MG tablet  Daily     01/10/17 1101   01/11/17 0000  Multiple Vitamin (MULTIVITAMIN WITH MINERALS) TABS tablet  Daily     01/10/17 1101   01/11/17 0000  thiamine 100 MG tablet  Daily     01/10/17 1101   01/10/17 0000  Water For Irrigation, Sterile (FREE WATER) SOLN  Every  3 hours     01/10/17 1101   01/10/17 0000  Nutritional Supplements (FEEDING SUPPLEMENT, JEVITY 1.2 CAL,) LIQD  Continuous     01/10/17 1101   01/10/17 0000  acetaminophen (TYLENOL) 325 MG tablet  Every 6 hours PRN     01/10/17 1101   01/10/17 0000  chlorhexidine gluconate, MEDLINE KIT, (PERIDEX) 0.12 % solution  2 times daily     01/10/17 1101   01/10/17 0000  cloNIDine (CATAPRES) 0.1 MG tablet  3 times daily     01/10/17 1101   01/10/17 0000  famotidine (PEPCID) 40 MG/5ML suspension  Every 12 hours     01/10/17 1101   01/10/17 0000  ipratropium-albuterol (DUONEB) 0.5-2.5 (3) MG/3ML SOLN  Every 4 hours PRN     01/10/17 1101   01/10/17 0000  mouth rinse LIQD solution  4 times daily     01/10/17 1101   01/10/17 0000  metoprolol tartrate (LOPRESSOR) 25 mg/10 mL SUSP  2 times daily     01/10/17 1101   01/10/17 0000  pravastatin (PRAVACHOL) 20 MG tablet  Daily-1800     01/10/17 1101   01/10/17 0000  Increase  activity slowly     01/10/17 1101      Day of Discharge BP (!) 126/57   Pulse 89   Temp 99.1 F (37.3 C) (Oral)   Resp (!) 26   Ht 5' 11"  (1.803 m)   Wt 66.7 kg (147 lb 0.8 oz)   SpO2 97%   BMI 20.51 kg/m   Physical Exam: General: No acute respiratory distress evident w/ pt on trach collar  Lungs: Clear to auscultation bilaterally without wheezes or crackles - some course upper airway sounds transmitted th/o  Cardiovascular: Regular rate and rhythm without murmur Abdomen: Nondistended, soft, bowel sounds positive, PEG insertion clean  Extremities: No significant edema bilateral lower extremities  Basic Metabolic Panel:  Recent Labs Lab 01/04/17 0320 01/05/17 0841 01/06/17 1152 01/07/17 1154 01/10/17 0448  NA 135 137 137 139 138  K 4.2 4.5 4.3 4.2 4.3  CL 102 103 102 105 102  CO2 26 27 28 26 26   GLUCOSE 103* 111* 92 126* 101*  BUN 31* 34* 39* 39* 30*  CREATININE 0.85 0.90 1.00 1.05 1.02  CALCIUM 10.5* 10.8* 10.6* 10.7* 10.7*  MG  --  2.0 2.1 2.1 2.0    Liver Function Tests:  Recent Labs Lab 01/07/17 1154  AST 19  ALT 28  ALKPHOS 105  BILITOT 0.4  PROT 7.9  ALBUMIN 2.0*    CBC:  Recent Labs Lab 01/04/17 0320 01/05/17 0841 01/06/17 1152 01/09/17 0646 01/10/17 0448  WBC 14.8* 16.6* 17.3* 18.7* 18.2*  NEUTROABS  --  12.1* 13.0*  --  13.2*  HGB 8.6* 10.0* 8.9* 9.8* 9.1*  HCT 28.4* 33.3* 29.5* 31.6* 28.9*  MCV 92.2 92.5 92.2 92.1 91.7  PLT 732* 842* 800* 845* 835*    CBG:  Recent Labs Lab 01/09/17 1625 01/09/17 1917 01/09/17 2324 01/10/17 0328 01/10/17 0746  GLUCAP 112* 122* 136* 138* 93    Recent Results (from the past 240 hour(s))  Culture, Urine  Status: None   Collection Time: 01/07/17  2:20 PM  Result Value Ref Range Status   Specimen Description URINE, CATHETERIZED  Final   Special Requests NONE  Final   Culture NO GROWTH  Final   Report Status 01/08/2017 FINAL  Final  Culture, blood (routine x 2)     Status: None  (Preliminary result)   Collection Time: 01/07/17  2:55 PM  Result Value Ref Range Status   Specimen Description BLOOD BLOOD LEFT HAND  Final   Special Requests   Final    BOTTLES DRAWN AEROBIC AND ANAEROBIC Blood Culture adequate volume   Culture NO GROWTH 2 DAYS  Final   Report Status PENDING  Incomplete  Culture, blood (routine x 2)     Status: None (Preliminary result)   Collection Time: 01/07/17  3:00 PM  Result Value Ref Range Status   Specimen Description BLOOD BLOOD RIGHT HAND  Final   Special Requests IN PEDIATRIC BOTTLE Blood Culture adequate volume  Final   Culture NO GROWTH 2 DAYS  Final   Report Status PENDING  Incomplete     Time spent in discharge (includes decision making & examination of pt): 35 minutes  01/10/2017, 11:01 AM   Scott Altes, MD [facility] Office  [phone] Pager [phone]  On-Call/Text Page:      [email or web address]      password [facility]

## 2017-01-10 NOTE — Progress Notes (Signed)
Report given to Helene Kelp, LPN @ [facility]. Pt to be transported via [facility] at 1315.

## 2017-01-12 LAB — CULTURE, BLOOD (ROUTINE X 2)
Culture: NO GROWTH
Culture: NO GROWTH
Special Requests: ADEQUATE
Special Requests: ADEQUATE

## 2017-02-09 ENCOUNTER — Emergency Department (HOSPITAL_COMMUNITY)
Admission: EM | Admit: 2017-02-09 | Discharge: 2017-02-09 | Disposition: A | Discharge status: Home / Self Care | Payer: Medicare HMO | Attending: Emergency Medicine | Admitting: Emergency Medicine

## 2017-02-09 ENCOUNTER — Emergency Department (HOSPITAL_COMMUNITY): Payer: Medicare HMO

## 2017-02-09 DIAGNOSIS — Z87891 Personal history of nicotine dependence: Secondary | ICD-10-CM | POA: Diagnosis not present

## 2017-02-09 DIAGNOSIS — Z79899 Other long term (current) drug therapy: Secondary | ICD-10-CM | POA: Diagnosis not present

## 2017-02-09 DIAGNOSIS — Z7982 Long term (current) use of aspirin: Secondary | ICD-10-CM | POA: Diagnosis not present

## 2017-02-09 DIAGNOSIS — I129 Hypertensive chronic kidney disease with stage 1 through stage 4 chronic kidney disease, or unspecified chronic kidney disease: Secondary | ICD-10-CM | POA: Diagnosis not present

## 2017-02-09 DIAGNOSIS — J9509 Other tracheostomy complication: Secondary | ICD-10-CM

## 2017-02-09 DIAGNOSIS — N183 Chronic kidney disease, stage 3 (moderate): Secondary | ICD-10-CM | POA: Insufficient documentation

## 2017-02-09 DIAGNOSIS — R05 Cough: Secondary | ICD-10-CM | POA: Diagnosis present

## 2017-02-09 DIAGNOSIS — J189 Pneumonia, unspecified organism: Secondary | ICD-10-CM | POA: Insufficient documentation

## 2017-02-09 DIAGNOSIS — Z8673 Personal history of transient ischemic attack (TIA), and cerebral infarction without residual deficits: Secondary | ICD-10-CM | POA: Diagnosis not present

## 2017-02-09 LAB — BASIC METABOLIC PANEL
Anion gap: 10 (ref 5–15)
BUN: 23 mg/dL — ABNORMAL HIGH (ref 6–20)
CO2: 25 mmol/L (ref 22–32)
Calcium: 10.4 mg/dL — ABNORMAL HIGH (ref 8.9–10.3)
Chloride: 101 mmol/L (ref 101–111)
Creatinine, Ser: 1.08 mg/dL (ref 0.61–1.24)
GFR calc Af Amer: >60 mL/min (ref >60)
GFR calc non Af Amer: >60 mL/min (ref >60)
Glucose, Bld: 80 mg/dL (ref 65–99)
Potassium: 4.3 mmol/L (ref 3.5–5.1)
Sodium: 136 mmol/L (ref 135–145)

## 2017-02-09 LAB — CBC WITH DIFFERENTIAL/PLATELET
Basophils Absolute: 0 10*3/uL (ref 0.0–0.1)
Basophils Relative: 0 %
Eosinophils Absolute: 0.7 10*3/uL (ref 0.0–0.7)
Eosinophils Relative: 5 %
HCT: 31 % — ABNORMAL LOW (ref 39.0–52.0)
Hemoglobin: 9.7 g/dL — ABNORMAL LOW (ref 13.0–17.0)
Lymphocytes Relative: 16 %
Lymphs Abs: 2.4 10*3/uL (ref 0.7–4.0)
MCH: 28 pg (ref 26.0–34.0)
MCHC: 31.3 g/dL (ref 30.0–36.0)
MCV: 89.3 fL (ref 78.0–100.0)
Monocytes Absolute: 2.4 10*3/uL — ABNORMAL HIGH (ref 0.1–1.0)
Monocytes Relative: 16 %
Neutro Abs: 9.1 10*3/uL — ABNORMAL HIGH (ref 1.7–7.7)
Neutrophils Relative %: 63 %
Platelets: 711 10*3/uL — ABNORMAL HIGH (ref 150–400)
RBC: 3.47 MIL/uL — ABNORMAL LOW (ref 4.22–5.81)
RDW: 15.8 % — ABNORMAL HIGH (ref 11.5–15.5)
WBC: 14.6 10*3/uL — ABNORMAL HIGH (ref 4.0–10.5)

## 2017-02-09 MED ORDER — LEVOFLOXACIN 750 MG PO TABS: 750.0000 mg | ORAL_TABLET | Freq: Every day | ORAL | 0 refills | Status: DC

## 2017-02-09 MED ORDER — LEVOFLOXACIN IN D5W 750 MG/150ML IV SOLN
750.0000 mg | Freq: Once | INTRAVENOUS | Status: AC
  Administered 2017-02-09: 750 mg via INTRAVENOUS
  Filled 2017-02-09: qty 150

## 2017-02-09 NOTE — Discharge Instructions (Signed)
Levaquin as prescribed.  Return to the emergency department for difficulty breathing, or other new and concerning symptoms.

## 2017-02-09 NOTE — ED Triage Notes (Signed)
Pt here via EMS from nursing home-[facility]-after pulling his trach out. EMS states that they witnessed staff replacing trach and after, pt coughed up large amount of blood. Staff report to EMS that pt was fighting with them and they had to hold his arms down so he didn't pull it out again. EMS reports that pt needed to come here for resources they they do not have.  Pt Non-verbal per EMS. No distress noted at this time. Blood noted to outside of trach.

## 2017-02-09 NOTE — ED Notes (Signed)
Pt needs deep suctioning. Resp called for assistance. Family at bedside.

## 2017-02-09 NOTE — ED Provider Notes (Signed)
[facility] EMERGENCY DEPARTMENT Provider Note   CSN: [id number] Arrival date & time: 02/09/17  4098     History   Chief Complaint Chief Complaint  Patient presents with  . Tracheostomy Tube Change    HPI Scott Rivera is a 66 y.o. male.  Patient is a 66 year old male with past medical history of severe CVA with significant neurologic sequalae.  He presents today for evaluation of tracheostomy issues.  My understanding was that he pulled his tracheostomy out this evening.  It was then replaced by the staff at the extended care facility.  I am told that he then had several episodes of mucus tinged with blood coughed through his tracheostomy.  Patient is nonverbal and adds no additional history.   The history is provided by the patient.    Past Medical History:  Diagnosis Date  . Anemia due to chronic kidney disease   . Aspiration pneumonia ([place])   . Chronic kidney disease, stage 3 (moderate)   . Hypertension   . Stroke [facility])     Patient Active Problem List   Diagnosis Date Noted  . Loculated pleural effusion   . Acute respiratory distress   . Fever   . Follow up   . Healthcare-associated pneumonia   . 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 Hypoxemia   . Tracheostomy status ([place])   . Pneumothorax   . Pneumothorax, closed, traumatic, initial encounter 12/10/2016  . Sepsis ([place]) 12/09/2016  . Hypotension 12/09/2016  . Acute-on-chronic kidney injury ([place]) 12/09/2016  . Elevated troponin 12/09/2016  . Anemia due to chronic kidney disease 12/09/2016  . Compensated metabolic alkalosis 11/91/4782  . Aspiration pneumonia ([place])   . Goals of care, counseling/discussion   . Palliative care by specialist   . Respiratory failure ([place])   . Aspiration into airway   . Septic shock ([place])   . Dysphagia, post-stroke   . Benign essential HTN   . Tobacco abuse   . Tachypnea   . Hyperglycemia   . Hypernatremia   . Leukocytosis  (leucocytosis)   . Acute blood loss anemia   . Chronic kidney disease   . Pressure injury of skin 10/20/2016  . Cerebellar infarct ([place]) 10/19/2016  . ARF (acute renal failure) ([place]) 10/19/2016  . Essential hypertension 10/19/2016  . Rhabdomyolysis 10/19/2016  . Acute ischemic stroke ([place]) 10/19/2016  . Bilateral chronic knee pain 01/07/2016  . History of adenomatous polyp of colon 07/28/2015  . Hyperlipidemia 07/28/2013  . Helicobacter pylori infection 09/21/2012    Past Surgical History:  Procedure Laterality Date  . IR GASTROSTOMY TUBE MOD SED  10/27/2016  . IR PATIENT EVAL TECH 0-60 MINS  01/05/2017  . IR PATIENT EVAL TECH 0-60 MINS  01/09/2017  . KNEE SURGERY    . TRACHEOSTOMY TUBE PLACEMENT         Home Medications    Prior to Admission medications   Medication Sig Start Date End Date Taking? Authorizing Provider  acetaminophen (TYLENOL) 325 MG tablet Place 2 tablets (650 mg total) into feeding tube every 6 (six) hours as needed for mild pain, fever or headache. 01/10/17   Cherene Altes, MD  amLODipine (NORVASC) 10 MG tablet Place 1 tablet (10 mg total) into feeding tube daily. 01/11/17   Cherene Altes, MD  aspirin 325 MG tablet Place 1 tablet (325 mg total) into feeding tube daily. 01/11/17   Cherene Altes, MD  chlorhexidine gluconate, MEDLINE KIT, (PERIDEX) 0.12 %  solution 15 mLs by Mouth Rinse route 2 (two) times daily. 01/10/17   Cherene Altes, MD  cloNIDine (CATAPRES) 0.1 MG tablet Place 1 tablet (0.1 mg total) into feeding tube 3 (three) times daily. 01/10/17   Cherene Altes, MD  clopidogrel (PLAVIX) 75 MG tablet Place 1 tablet (75 mg total) into feeding tube daily. 01/11/17   Cherene Altes, MD  famotidine (PEPCID) 40 MG/5ML suspension Place 2.5 mLs (20 mg total) into feeding tube every 12 (twelve) hours. 01/10/17   Cherene Altes, MD  gabapentin (NEURONTIN) 100 MG capsule Take 200 mg by mouth at bedtime.    [provider]    ipratropium-albuterol (DUONEB) 0.5-2.5 (3) MG/3ML SOLN Take 3 mLs by nebulization every 4 (four) hours as needed. 01/10/17   Cherene Altes, MD  metoprolol tartrate (LOPRESSOR) 25 mg/10 mL SUSP Place 10 mLs (25 mg total) into feeding tube 2 (two) times daily. 01/10/17   Cherene Altes, MD  mouth rinse LIQD solution 15 mLs by Mouth Rinse route QID. 01/10/17   Cherene Altes, MD  Multiple Vitamin (MULTIVITAMIN WITH MINERALS) TABS tablet Place 1 tablet into feeding tube daily. 01/11/17   Cherene Altes, MD  Nutritional Supplements (FEEDING SUPPLEMENT, JEVITY 1.2 CAL,) LIQD Place 1,000 mLs into feeding tube continuous. 01/10/17   Cherene Altes, MD  pravastatin (PRAVACHOL) 20 MG tablet Place 1 tablet (20 mg total) into feeding tube daily at 6 PM. 01/10/17   Cherene Altes, MD  thiamine 100 MG tablet Place 1 tablet (100 mg total) into feeding tube daily. 01/11/17   Cherene Altes, MD  Water For Irrigation, Sterile (FREE WATER) SOLN Place 100 mLs into feeding tube every 3 (three) hours. 01/10/17   Cherene Altes, MD    Family History Family History  Problem Relation Age of Onset  . Diabetes Mellitus II Brother   . CAD Neg Hx   . Stroke Neg Hx     Social History Social History  Substance Use Topics  . Smoking status: Former Research scientist (life sciences)  . Smokeless tobacco: Never Used     Comment: Smoked heavily until the day of his stroke  . Alcohol use Yes     Comment: Drank heavily prior to stroke per sister     Allergies   Patient has no known allergies.   Review of Systems Review of Systems  Unable to perform ROS: Patient nonverbal     Physical Exam Updated Vital Signs BP 136/70 (BP Location: Right Arm)   Pulse 96   Temp 98.2 F (36.8 C) (Axillary)   Resp 20   SpO2 95%   Physical Exam  Constitutional: He appears well-developed and well-nourished. No distress.  Patient is a chronically ill-appearing 66 year old male.  He is in no distress.  HENT:  Head:  Normocephalic and atraumatic.  Mouth/Throat: Oropharynx is clear and moist.  Eyes: Pupils are equal, round, and reactive to light. EOM are normal.  Neck: Normal range of motion. Neck supple.  Tracheostomy is in place.  It is tinged with bright red blood.  Cardiovascular: Normal rate and regular rhythm.  Exam reveals no friction rub.   No murmur heard. Pulmonary/Chest: Effort normal. He has no wheezes. He has no rales.  No stridor  Abdominal: Soft. Bowel sounds are normal. He exhibits no distension. There is no tenderness.  Musculoskeletal: Normal range of motion. He exhibits no edema.  Neurological: He is alert. Coordination normal.  Patient is awake and alert.  He will  make eye contact, however cannot respond to questions or follow commands.  Skin: Skin is warm and dry. He is not diaphoretic.  Nursing note and vitals reviewed.    ED Treatments / Results  Labs (all labs ordered are listed, but only abnormal results are displayed) Labs Reviewed  BASIC METABOLIC PANEL  CBC WITH DIFFERENTIAL/PLATELET    EKG  EKG Interpretation None       Radiology No results found.  Procedures Procedures (including critical care time)  Medications Ordered in ED Medications - No data to display   Initial Impression / Assessment and Plan / ED Course  I have reviewed the triage vital signs and the nursing notes.  Pertinent labs & imaging results that were available during my care of the patient were reviewed by me and considered in my medical decision making (see chart for details).  Patient brought to the emergency department from his extended care facility after pulling out his tracheostomy.  This was replaced by the nurses and appears to be in adequate position.  His workup today reveals no fever.  He does have a mild white count of 14,000 and chest x-ray that is suggestive of pneumonia.  He will be treated with Levaquin intravenously in the ED, then discharged to the ECF.  He appears  comfortable in no respiratory distress.  He is saturating in the mid to upper 90s on oxygen by trach collar.  He will be prescribed Levaquin to take at the [facility].  Final Clinical Impressions(s) / ED Diagnoses   Final diagnoses:  None    New Prescriptions New Prescriptions   No medications on file     Veryl Speak, MD 02/09/17 [phone]

## 2017-02-09 NOTE — ED Notes (Signed)
Report given to [facility]

## 2017-02-09 NOTE — ED Notes (Signed)
Patient transported to X-ray 

## 2017-02-17 ENCOUNTER — Encounter (HOSPITAL_COMMUNITY): Payer: Self-pay

## 2017-02-17 ENCOUNTER — Emergency Department (HOSPITAL_COMMUNITY)
Admission: EM | Admit: 2017-02-17 | Discharge: 2017-02-18 | Disposition: A | Discharge status: Home / Self Care | Payer: Medicare HMO | Attending: Emergency Medicine | Admitting: Emergency Medicine

## 2017-02-17 DIAGNOSIS — I129 Hypertensive chronic kidney disease with stage 1 through stage 4 chronic kidney disease, or unspecified chronic kidney disease: Secondary | ICD-10-CM | POA: Diagnosis not present

## 2017-02-17 DIAGNOSIS — K9423 Gastrostomy malfunction: Secondary | ICD-10-CM | POA: Diagnosis not present

## 2017-02-17 DIAGNOSIS — N183 Chronic kidney disease, stage 3 (moderate): Secondary | ICD-10-CM | POA: Insufficient documentation

## 2017-02-17 DIAGNOSIS — Z79899 Other long term (current) drug therapy: Secondary | ICD-10-CM | POA: Diagnosis not present

## 2017-02-17 DIAGNOSIS — Z87891 Personal history of nicotine dependence: Secondary | ICD-10-CM | POA: Diagnosis not present

## 2017-02-17 DIAGNOSIS — T85598A Other mechanical complication of other gastrointestinal prosthetic devices, implants and grafts, initial encounter: Secondary | ICD-10-CM

## 2017-02-17 MED ORDER — SODIUM BICARBONATE 650 MG PO TABS
650.0000 mg | ORAL_TABLET | Freq: Once | ORAL | Status: AC
  Administered 2017-02-17: 650 mg via ORAL
  Filled 2017-02-17: qty 1

## 2017-02-17 MED ORDER — PANCRELIPASE (LIP-PROT-AMYL) 12000-38000 UNITS PO CPEP
24000.0000 [IU] | ORAL_CAPSULE | Freq: Once | ORAL | Status: AC
  Administered 2017-02-17: 24000 [IU] via ORAL
  Filled 2017-02-17: qty 2

## 2017-02-17 NOTE — ED Triage Notes (Signed)
States family from home pt feeding tube clogged up and unable to unstop it no other complaints voiced per family.

## 2017-02-17 NOTE — ED Provider Notes (Signed)
[facility] DEPT Provider Note   CSN: [id number] Arrival date & time: 02/17/17  2025     History   Chief Complaint Chief Complaint  Patient presents with  . Feeding Intolerance    feeding tube clogged    HPI TATEM FESLER is a 66 y.o. male.  HPI  66 year old male with a history of a stroke as well as a trach and a gastrostomy tube presents with a clogged gastrostomy tube.  The tube has been getting clogged on and off for the last 4 days.  However today for most of the day they have been unable to break it up.  The sister and home health nurse have been intermittently flushing it which seems to temporarily relieve it until today.  Otherwise no acute complaints such as fever, shortness of breath, abdominal pain, or vomiting.  Past Medical History:  Diagnosis Date  . Anemia due to chronic kidney disease   . Aspiration pneumonia ([place])   . Chronic kidney disease, stage 3 (moderate) (HCC)   . Hypertension   . Stroke [facility])     Patient Active Problem List   Diagnosis Date Noted  . Loculated pleural effusion   . Acute respiratory distress   . Fever   . Follow up   . Healthcare-associated pneumonia   . Stage III pressure ulcer of sacral region ([place]) 12/17/2016  . Hypoxemia   . Tracheostomy status ([place])   . Pneumothorax   . Pneumothorax, closed, traumatic, initial encounter 12/10/2016  . Sepsis ([place]) 12/09/2016  . Hypotension 12/09/2016  . Acute-on-chronic kidney injury ([place]) 12/09/2016  . Elevated troponin 12/09/2016  . Anemia due to chronic kidney disease 12/09/2016  . Compensated metabolic alkalosis 69/67/8938  . Aspiration pneumonia ([place])   . Goals of care, counseling/discussion   . Palliative care by specialist   . Respiratory failure ([place])   . Aspiration into airway   . Septic shock ([place])   . Dysphagia, post-stroke   . Benign essential HTN   . Tobacco abuse   . Tachypnea   . Hyperglycemia   . Hypernatremia   . Leukocytosis  (leucocytosis)   . Acute blood loss anemia   . Chronic kidney disease   . Pressure injury of skin 10/20/2016  . Cerebellar infarct ([place]) 10/19/2016  . ARF (acute renal failure) ([place]) 10/19/2016  . Essential hypertension 10/19/2016  . Rhabdomyolysis 10/19/2016  . Acute ischemic stroke ([place]) 10/19/2016  . Bilateral chronic knee pain 01/07/2016  . History of adenomatous polyp of colon 07/28/2015  . Hyperlipidemia 07/28/2013  . Helicobacter pylori infection 09/21/2012    Past Surgical History:  Procedure Laterality Date  . IR GASTROSTOMY TUBE MOD SED  10/27/2016  . IR PATIENT EVAL TECH 0-60 MINS  01/05/2017  . IR PATIENT EVAL TECH 0-60 MINS  01/09/2017  . KNEE SURGERY    . TRACHEOSTOMY TUBE PLACEMENT         Home Medications    Prior to Admission medications   Medication Sig Start Date End Date Taking? Authorizing Provider  acetaminophen (TYLENOL) 325 MG tablet Place 2 tablets (650 mg total) into feeding tube every 6 (six) hours as needed for mild pain, fever or headache. 01/10/17   Cherene Altes, MD  amLODipine (NORVASC) 10 MG tablet Place 1 tablet (10 mg total) into feeding tube daily. 01/11/17   Cherene Altes, MD  aspirin 325 MG tablet Place 1 tablet (325 mg total) into feeding tube daily. 01/11/17   Cherene Altes, MD  chlorhexidine gluconate, MEDLINE KIT, (PERIDEX) 0.12 % solution 15 mLs by Mouth Rinse route 2 (two) times daily. 01/10/17   Cherene Altes, MD  cloNIDine (CATAPRES) 0.1 MG tablet Place 1 tablet (0.1 mg total) into feeding tube 3 (three) times daily. 01/10/17   Cherene Altes, MD  clopidogrel (PLAVIX) 75 MG tablet Place 1 tablet (75 mg total) into feeding tube daily. 01/11/17   Cherene Altes, MD  famotidine (PEPCID) 40 MG/5ML suspension Place 2.5 mLs (20 mg total) into feeding tube every 12 (twelve) hours. 01/10/17   Cherene Altes, MD  gabapentin (NEURONTIN) 100 MG capsule Take 200 mg by mouth at bedtime.    [provider]    ipratropium-albuterol (DUONEB) 0.5-2.5 (3) MG/3ML SOLN Take 3 mLs by nebulization every 4 (four) hours as needed. 01/10/17   Cherene Altes, MD  levofloxacin (LEVAQUIN) 750 MG tablet Take 1 tablet (750 mg total) by mouth daily. X 7 days 02/09/17   Veryl Speak, MD  metoprolol tartrate (LOPRESSOR) 25 mg/10 mL SUSP Place 10 mLs (25 mg total) into feeding tube 2 (two) times daily. 01/10/17   Cherene Altes, MD  mouth rinse LIQD solution 15 mLs by Mouth Rinse route QID. 01/10/17   Cherene Altes, MD  Multiple Vitamin (MULTIVITAMIN WITH MINERALS) TABS tablet Place 1 tablet into feeding tube daily. 01/11/17   Cherene Altes, MD  Nutritional Supplements (FEEDING SUPPLEMENT, JEVITY 1.2 CAL,) LIQD Place 1,000 mLs into feeding tube continuous. 01/10/17   Cherene Altes, MD  pravastatin (PRAVACHOL) 20 MG tablet Place 1 tablet (20 mg total) into feeding tube daily at 6 PM. 01/10/17   Cherene Altes, MD  thiamine 100 MG tablet Place 1 tablet (100 mg total) into feeding tube daily. 01/11/17   Cherene Altes, MD  Water For Irrigation, Sterile (FREE WATER) SOLN Place 100 mLs into feeding tube every 3 (three) hours. 01/10/17   Cherene Altes, MD    Family History Family History  Problem Relation Age of Onset  . Diabetes Mellitus II Brother   . CAD Neg Hx   . Stroke Neg Hx     Social History Social History  Substance Use Topics  . Smoking status: Former Research scientist (life sciences)  . Smokeless tobacco: Never Used     Comment: Smoked heavily until the day of his stroke  . Alcohol use Yes     Comment: Drank heavily prior to stroke per sister     Allergies   Patient has no known allergies.   Review of Systems Review of Systems  Constitutional: Negative for fever.  Respiratory: Negative for shortness of breath.   Gastrointestinal: Negative for abdominal pain and vomiting.  All other systems reviewed and are negative.    Physical Exam Updated Vital Signs BP 118/63 (BP Location: Left Arm)    Pulse 76   Temp 97.6 F (36.4 C) (Oral)   Resp 15   Ht '5\' 6"'$  (1.676 m)   Wt 63.5 kg (140 lb)   SpO2 97%   BMI 22.60 kg/m   Physical Exam  Constitutional: He is oriented to person, place, and time. He appears well-developed and well-nourished. No distress.  HENT:  Head: Normocephalic and atraumatic.  Right Ear: External ear normal.  Left Ear: External ear normal.  Nose: Nose normal.  Eyes: Right eye exhibits no discharge. Left eye exhibits no discharge.  Neck: Neck supple.  Trach in place  Cardiovascular: Normal rate, regular rhythm and normal heart sounds.   Pulmonary/Chest:  Effort normal and breath sounds normal.  Abdominal: Soft. He exhibits no distension. There is no tenderness.  Gastrostomy tube in place, appears full of dark contents in tube  Musculoskeletal: He exhibits no edema.  Neurological: He is alert and oriented to person, place, and time.  Skin: Skin is warm and dry. He is not diaphoretic.  Nursing note and vitals reviewed.    ED Treatments / Results  Labs (all labs ordered are listed, but only abnormal results are displayed) Labs Reviewed - No data to display  EKG  EKG Interpretation None       Radiology No results found.  Procedures Procedures (including critical care time)  Medications Ordered in ED Medications  lipase/protease/amylase (CREON) capsule 24,000 Units (24,000 Units Oral Given 02/17/17 2330)    And  sodium bicarbonate tablet 650 mg (650 mg Oral Given 02/17/17 2330)     Initial Impression / Assessment and Plan / ED Course  I have reviewed the triage vital signs and the nursing notes.  Pertinent labs & imaging results that were available during my care of the patient were reviewed by me and considered in my medical decision making (see chart for details).     Patient appears at baseline. If tube cannot be unclogged may need labs and possibly admission as he cannot tolerate PO by mouth. Care to Dr. Darl Householder with unclogging  pending.  Final Clinical Impressions(s) / ED Diagnoses   Final diagnoses:  Obstruction of feeding tube, initial encounter    New Prescriptions New Prescriptions   No medications on file     Sherwood Gambler, MD 02/17/17 2340

## 2017-02-18 LAB — COMPREHENSIVE METABOLIC PANEL
ALT: 19 U/L (ref 17–63)
AST: 15 U/L (ref 15–41)
Albumin: 2.7 g/dL — ABNORMAL LOW (ref 3.5–5.0)
Alkaline Phosphatase: 77 U/L (ref 38–126)
Anion gap: 10 (ref 5–15)
BUN: 27 mg/dL — ABNORMAL HIGH (ref 6–20)
CO2: 27 mmol/L (ref 22–32)
Calcium: 10.5 mg/dL — ABNORMAL HIGH (ref 8.9–10.3)
Chloride: 104 mmol/L (ref 101–111)
Creatinine, Ser: 1.05 mg/dL (ref 0.61–1.24)
GFR calc Af Amer: >60 mL/min (ref >60)
GFR calc non Af Amer: >60 mL/min (ref >60)
Glucose, Bld: 82 mg/dL (ref 65–99)
Potassium: 4 mmol/L (ref 3.5–5.1)
Sodium: 141 mmol/L (ref 135–145)
Total Bilirubin: 0.4 mg/dL (ref 0.3–1.2)
Total Protein: 8.4 g/dL — ABNORMAL HIGH (ref 6.5–8.1)

## 2017-02-18 LAB — I-STAT CHEM 8, ED
BUN: 28 mg/dL — ABNORMAL HIGH (ref 6–20)
Calcium, Ion: 1.39 mmol/L (ref 1.15–1.40)
Chloride: 105 mmol/L (ref 101–111)
Creatinine, Ser: 1.1 mg/dL (ref 0.61–1.24)
Glucose, Bld: 81 mg/dL (ref 65–99)
HCT: 33 % — ABNORMAL LOW (ref 39.0–52.0)
Hemoglobin: 11.2 g/dL — ABNORMAL LOW (ref 13.0–17.0)
Potassium: 4 mmol/L (ref 3.5–5.1)
Sodium: 142 mmol/L (ref 135–145)
TCO2: 29 mmol/L (ref 22–32)

## 2017-02-18 LAB — CBC WITH DIFFERENTIAL/PLATELET
Basophils Absolute: 0 10*3/uL (ref 0.0–0.1)
Basophils Relative: 0 %
Eosinophils Absolute: 0.3 10*3/uL (ref 0.0–0.7)
Eosinophils Relative: 2 %
HCT: 33.2 % — ABNORMAL LOW (ref 39.0–52.0)
Hemoglobin: 10.4 g/dL — ABNORMAL LOW (ref 13.0–17.0)
Lymphocytes Relative: 16 %
Lymphs Abs: 2.7 10*3/uL (ref 0.7–4.0)
MCH: 28 pg (ref 26.0–34.0)
MCHC: 31.3 g/dL (ref 30.0–36.0)
MCV: 89.2 fL (ref 78.0–100.0)
Monocytes Absolute: 1.5 10*3/uL — ABNORMAL HIGH (ref 0.1–1.0)
Monocytes Relative: 9 %
Neutro Abs: 12.2 10*3/uL — ABNORMAL HIGH (ref 1.7–7.7)
Neutrophils Relative %: 73 %
Platelets: 812 10*3/uL — ABNORMAL HIGH (ref 150–400)
RBC: 3.72 MIL/uL — ABNORMAL LOW (ref 4.22–5.81)
RDW: 15.8 % — ABNORMAL HIGH (ref 11.5–15.5)
WBC: 16.7 10*3/uL — ABNORMAL HIGH (ref 4.0–10.5)

## 2017-02-18 MED ORDER — CLONIDINE HCL 0.1 MG PO TABS
ORAL_TABLET | ORAL | Status: AC
  Administered 2017-02-18: 02:00:00
  Filled 2017-02-18: qty 1

## 2017-02-18 MED ORDER — METOPROLOL TARTRATE 25 MG PO TABS
ORAL_TABLET | ORAL | Status: AC
  Administered 2017-02-18: 02:00:00
  Filled 2017-02-18: qty 1

## 2017-02-18 MED ORDER — FAMOTIDINE 20 MG PO TABS
ORAL_TABLET | ORAL | Status: AC
  Administered 2017-02-18: 02:00:00
  Filled 2017-02-18: qty 1

## 2017-02-18 NOTE — Discharge Instructions (Signed)
You can use G tube.   Talk to your doctor if G tube is not working well and your doctor may need to contact IR doctor to get it replaced   Return to ER if G tube clogged, fever, vomiting.

## 2017-02-18 NOTE — ED Provider Notes (Signed)
  Physical Exam  BP (!) 181/80 (BP Location: Right Arm)   Pulse (!) 102   Temp 97.6 F (36.4 C) (Oral)   Resp (!) 22   Ht 5\' 6"  (1.676 m)   Wt 63.5 kg (140 lb)   SpO2 100%   BMI 22.60 kg/m   Physical Exam  ED Course  Procedures  MDM Patient is here for G tube malfunction. Nursing able to declog the G tube. Labs at baseline. Had recent admission in September and G tube was unclogged at that time and since then. G tube was placed by IR and may need replacement if it continues to malfunction. Able to give meds through G tube. Will dc back to facility.        Drenda Freeze, MD 02/18/17 [phone]

## 2017-02-18 NOTE — ED Notes (Signed)
Attempted to suction pt and pt refused.

## 2017-02-20 ENCOUNTER — Encounter (HOSPITAL_COMMUNITY): Payer: Self-pay | Admitting: Emergency Medicine

## 2017-02-20 ENCOUNTER — Emergency Department (HOSPITAL_COMMUNITY)
Admission: EM | Admit: 2017-02-20 | Discharge: 2017-02-20 | Disposition: A | Discharge status: Home / Self Care | Payer: Medicare HMO | Attending: Emergency Medicine | Admitting: Emergency Medicine

## 2017-02-20 DIAGNOSIS — Z79899 Other long term (current) drug therapy: Secondary | ICD-10-CM | POA: Diagnosis not present

## 2017-02-20 DIAGNOSIS — Z7982 Long term (current) use of aspirin: Secondary | ICD-10-CM | POA: Insufficient documentation

## 2017-02-20 DIAGNOSIS — Z7902 Long term (current) use of antithrombotics/antiplatelets: Secondary | ICD-10-CM | POA: Diagnosis not present

## 2017-02-20 DIAGNOSIS — N183 Chronic kidney disease, stage 3 (moderate): Secondary | ICD-10-CM | POA: Insufficient documentation

## 2017-02-20 DIAGNOSIS — T85598A Other mechanical complication of other gastrointestinal prosthetic devices, implants and grafts, initial encounter: Secondary | ICD-10-CM | POA: Diagnosis present

## 2017-02-20 DIAGNOSIS — Y829 Unspecified medical devices associated with adverse incidents: Secondary | ICD-10-CM | POA: Diagnosis not present

## 2017-02-20 DIAGNOSIS — Z87891 Personal history of nicotine dependence: Secondary | ICD-10-CM | POA: Insufficient documentation

## 2017-02-20 DIAGNOSIS — I129 Hypertensive chronic kidney disease with stage 1 through stage 4 chronic kidney disease, or unspecified chronic kidney disease: Secondary | ICD-10-CM | POA: Diagnosis not present

## 2017-02-20 NOTE — ED Notes (Signed)
[facility] called for transport.  

## 2017-02-20 NOTE — ED Notes (Signed)
Respiratory at bedside.

## 2017-02-20 NOTE — ED Notes (Signed)
Patient unable to sign d/c paperwork. Reviewed d/c instructions with sister before she left.

## 2017-02-20 NOTE — ED Notes (Signed)
Respiratory called for additional suctioning.

## 2017-02-20 NOTE — ED Notes (Signed)
ED Provider at bedside. 

## 2017-02-20 NOTE — ED Triage Notes (Addendum)
Per [facility], patient from home, sister reports feeding tube has been clogged since this morning. Seen for same on Saturday. Patient has trach. Respiratory at bedside suctioning at this time. No other complaints. Patient is nonverbal and nonambulatory.  BP 134/64 HR 92 O2 92% 3L

## 2017-02-20 NOTE — ED Provider Notes (Signed)
[facility] DEPT Provider Note   CSN: [id number] Arrival date & time: 02/20/17  1455     History   Chief Complaint Chief Complaint  Patient presents with  . Feeding Tube Clogged    HPI Scott Rivera is a 66 y.o. male with a history of stroke on 10/15/16 leaving him aphasic, trach and feeding tube dependent presents today with his sister for evaluation of feeding tube clogged since this morning.  Chart review shows that he was here approximately 3 days ago for the same.  His sister is his caregiver and reports that she has tried washing it, along with giving him Coca-Cola through his tube.  She does not have any other concerns at this time, and is only here to get his feeding tube unclogged.  His sister says that he has not had any of his medications today.  Patient is unable to provide any history.   HPI  Past Medical History:  Diagnosis Date  . Anemia due to chronic kidney disease   . Aspiration pneumonia ([place])   . Chronic kidney disease, stage 3 (moderate) (HCC)   . Hypertension   . Stroke [facility])     Patient Active Problem List   Diagnosis Date Noted  . Loculated pleural effusion   . Acute respiratory distress   . Fever   . Follow up   . Healthcare-associated pneumonia   . Stage III pressure ulcer of sacral region ([place]) 12/17/2016  . Hypoxemia   . Tracheostomy status ([place])   . Pneumothorax   . Pneumothorax, closed, traumatic, initial encounter 12/10/2016  . Sepsis ([place]) 12/09/2016  . Hypotension 12/09/2016  . Acute-on-chronic kidney injury ([facility]) 12/09/2016  . Elevated troponin 12/09/2016  . Anemia due to chronic kidney disease 12/09/2016  . Compensated metabolic alkalosis 52/84/1324  . Aspiration pneumonia ([place])   . Goals of care, counseling/discussion   . Palliative care by specialist   . Respiratory failure ([place])   . Aspiration into airway   . Septic shock ([place])   . Dysphagia, post-stroke   . Benign essential HTN   . Tobacco  abuse   . Tachypnea   . Hyperglycemia   . Hypernatremia   . Leukocytosis (leucocytosis)   . Acute blood loss anemia   . Chronic kidney disease   . Pressure injury of skin 10/20/2016  . Cerebellar infarct ([place]) 10/19/2016  . ARF (acute renal failure) ([place]) 10/19/2016  . Essential hypertension 10/19/2016  . Rhabdomyolysis 10/19/2016  . Acute ischemic stroke ([place]) 10/19/2016  . Bilateral chronic knee pain 01/07/2016  . History of adenomatous polyp of colon 07/28/2015  . Hyperlipidemia 07/28/2013  . Helicobacter pylori infection 09/21/2012    Past Surgical History:  Procedure Laterality Date  . IR GASTROSTOMY TUBE MOD SED  10/27/2016  . IR PATIENT EVAL TECH 0-60 MINS  01/05/2017  . IR PATIENT EVAL TECH 0-60 MINS  01/09/2017  . KNEE SURGERY    . TRACHEOSTOMY TUBE PLACEMENT         Home Medications    Prior to Admission medications   Medication Sig Start Date End Date Taking? Authorizing Provider  acetaminophen (TYLENOL) 325 MG tablet Place 2 tablets (650 mg total) into feeding tube every 6 (six) hours as needed for mild pain, fever or headache. Patient not taking: Reported on 02/17/2017 01/10/17   Cherene Altes, MD  acetaminophen (TYLENOL) 500 MG tablet Take 1,000 mg by mouth every 6 (six) hours as needed for moderate pain.    [provider]  amLODipine (NORVASC) 10 MG tablet Place 1 tablet (10 mg total) into feeding tube daily. 01/11/17   Cherene Altes, MD  aspirin 325 MG tablet Place 1 tablet (325 mg total) into feeding tube daily. 01/11/17   Cherene Altes, MD  chlorhexidine gluconate, MEDLINE KIT, (PERIDEX) 0.12 % solution 15 mLs by Mouth Rinse route 2 (two) times daily. Patient not taking: Reported on 02/18/2017 01/10/17   Cherene Altes, MD  cloNIDine (CATAPRES) 0.1 MG tablet Place 1 tablet (0.1 mg total) into feeding tube 3 (three) times daily. 01/10/17   Cherene Altes, MD  clopidogrel (PLAVIX) 75 MG tablet Place 1 tablet (75 mg total) into feeding  tube daily. 01/11/17   Cherene Altes, MD  famotidine (PEPCID) 40 MG/5ML suspension Place 2.5 mLs (20 mg total) into feeding tube every 12 (twelve) hours. 01/10/17   Cherene Altes, MD  gabapentin (NEURONTIN) 100 MG capsule Take 200 mg by mouth at bedtime.    [provider]  ipratropium-albuterol (DUONEB) 0.5-2.5 (3) MG/3ML SOLN Take 3 mLs by nebulization every 4 (four) hours as needed. 01/10/17   Cherene Altes, MD  levofloxacin (LEVAQUIN) 750 MG tablet Take 1 tablet (750 mg total) by mouth daily. X 7 days Patient not taking: Reported on 02/18/2017 02/09/17   Veryl Speak, MD  metoprolol tartrate (LOPRESSOR) 25 MG tablet Take 25 mg by mouth 2 (two) times daily.    [provider]  metoprolol tartrate (LOPRESSOR) 25 mg/10 mL SUSP Place 10 mLs (25 mg total) into feeding tube 2 (two) times daily. Patient not taking: Reported on 02/17/2017 01/10/17   Cherene Altes, MD  mouth rinse LIQD solution 15 mLs by Mouth Rinse route QID. Patient taking differently: 15 mLs by Mouth Rinse route 2 (two) times daily.  01/10/17   Cherene Altes, MD  Multiple Vitamin (MULTIVITAMIN WITH MINERALS) TABS tablet Place 1 tablet into feeding tube daily. Patient not taking: Reported on 02/18/2017 01/11/17   Cherene Altes, MD  Nutritional Supplements (FEEDING SUPPLEMENT, JEVITY 1.2 CAL,) LIQD Place 1,000 mLs into feeding tube continuous. 01/10/17   Cherene Altes, MD  pravastatin (PRAVACHOL) 20 MG tablet Place 1 tablet (20 mg total) into feeding tube daily at 6 PM. 01/10/17   Cherene Altes, MD  thiamine 100 MG tablet Place 1 tablet (100 mg total) into feeding tube daily. 01/11/17   Cherene Altes, MD  Water For Irrigation, Sterile (FREE WATER) SOLN Place 100 mLs into feeding tube every 3 (three) hours. 01/10/17   Cherene Altes, MD    Family History Family History  Problem Relation Age of Onset  . Diabetes Mellitus II Brother   . CAD Neg Hx   . Stroke Neg Hx     Social  History Social History   Tobacco Use  . Smoking status: Former Research scientist (life sciences)  . Smokeless tobacco: Never Used  . Tobacco comment: Smoked heavily until the day of his stroke  Substance Use Topics  . Alcohol use: Yes    Comment: Drank heavily prior to stroke per sister  . Drug use: Not on file     Allergies   Patient has no known allergies.   Review of Systems Review of Systems  Unable to perform ROS: Patient nonverbal     Physical Exam Updated Vital Signs BP (!) 142/80   Pulse 100   Temp 98.5 F (36.9 C) (Oral)   Resp 18   SpO2 94%   Physical Exam  Constitutional: He appears well-developed and well-nourished.  HENT:  Head: Normocephalic and atraumatic.  Eyes: Conjunctivae are normal.  Neck: Neck supple.  Cardiovascular: Normal rate and regular rhythm.  No murmur heard. Pulmonary/Chest: Effort normal and breath sounds normal. No respiratory distress.  Trach in place.  Transmitted upper airway noise.  Abdominal: Soft. Bowel sounds are normal. He exhibits no distension. There is no tenderness.  Feeding tube in place, appears clogged.  Will not flush.  Musculoskeletal: He exhibits no edema or deformity.  Neurological: He is alert.  Skin: Skin is warm and dry.  Nursing note and vitals reviewed.    ED Treatments / Results  Labs (all labs ordered are listed, but only abnormal results are displayed) Labs Reviewed - No data to display  EKG  EKG Interpretation None       Radiology No results found.  Procedures Procedures  FEEDING TUBE unclogging Date/Time: 02/20/2017 3:49 PM Performed by: Lorin Glass, PA-C Authorized by: Lorin Glass, PA-C  Consent: Verbal consent obtained by guardian/sister Risks and benefits: risks, benefits and alternatives were discussed Consent given by: guardian Indications: tube blocked Tube was attempted to flush, would not easily flush.  Small amount of coca-cola was used and allowed to dwell in the tube. After tube  was flushed easily, stomach contents aspirated.  Bowel sounds unchanged post procedure.  No excess force was used.    Medications Ordered in ED Medications - No data to display   Initial Impression / Assessment and Plan / ED Course  I have reviewed the triage vital signs and the nursing notes.  Pertinent labs & imaging results that were available during my care of the patient were reviewed by me and considered in my medical decision making (see chart for details).  Clinical Course as of Feb 20 2299  Tue Feb 20, 2017  1559 Went to discuss medications that sister wanted for patient before leaving and she was not in room.  Will discharge.   [EH]    Clinical Course User Index [EH] Lorin Glass, PA-C   Bridget Hartshorn presents with his sister for evaluation of feeding tube clogged.  Tube was unclogged using Coca-Cola.  After procedure tube was flushed easily, stomach contents were easily aspirated, bowel sounds and abdominal exam were unchanged.  Discharged home.   Final Clinical Impressions(s) / ED Diagnoses   Final diagnoses:  Obstruction of feeding tube, initial encounter    ED Discharge Orders    None       Ollen Gross 02/20/17 2300    Tegeler, Gwenyth Allegra, MD 02/21/17 [phone]

## 2017-02-24 ENCOUNTER — Emergency Department (HOSPITAL_COMMUNITY): Payer: Medicare HMO

## 2017-02-24 ENCOUNTER — Emergency Department (HOSPITAL_COMMUNITY)
Admission: EM | Admit: 2017-02-24 | Discharge: 2017-02-24 | Disposition: A | Discharge status: Home / Self Care | Payer: Medicare HMO | Attending: Emergency Medicine | Admitting: Emergency Medicine

## 2017-02-24 ENCOUNTER — Encounter (HOSPITAL_COMMUNITY): Payer: Self-pay | Admitting: Emergency Medicine

## 2017-02-24 DIAGNOSIS — Z7902 Long term (current) use of antithrombotics/antiplatelets: Secondary | ICD-10-CM | POA: Insufficient documentation

## 2017-02-24 DIAGNOSIS — Y829 Unspecified medical devices associated with adverse incidents: Secondary | ICD-10-CM | POA: Insufficient documentation

## 2017-02-24 DIAGNOSIS — Z79899 Other long term (current) drug therapy: Secondary | ICD-10-CM | POA: Diagnosis not present

## 2017-02-24 DIAGNOSIS — N183 Chronic kidney disease, stage 3 (moderate): Secondary | ICD-10-CM | POA: Insufficient documentation

## 2017-02-24 DIAGNOSIS — Z87891 Personal history of nicotine dependence: Secondary | ICD-10-CM | POA: Insufficient documentation

## 2017-02-24 DIAGNOSIS — I129 Hypertensive chronic kidney disease with stage 1 through stage 4 chronic kidney disease, or unspecified chronic kidney disease: Secondary | ICD-10-CM | POA: Insufficient documentation

## 2017-02-24 DIAGNOSIS — R05 Cough: Secondary | ICD-10-CM | POA: Diagnosis present

## 2017-02-24 DIAGNOSIS — Z7982 Long term (current) use of aspirin: Secondary | ICD-10-CM | POA: Insufficient documentation

## 2017-02-24 DIAGNOSIS — T85598A Other mechanical complication of other gastrointestinal prosthetic devices, implants and grafts, initial encounter: Secondary | ICD-10-CM | POA: Diagnosis not present

## 2017-02-24 MED ORDER — PANCRELIPASE (LIP-PROT-AMYL) 12000-38000 UNITS PO CPEP
12000.0000 [IU] | ORAL_CAPSULE | Freq: Once | ORAL | Status: AC
  Administered 2017-02-24: 12000 [IU] via ORAL
  Filled 2017-02-24: qty 1

## 2017-02-24 MED ORDER — SODIUM BICARBONATE 650 MG PO TABS
650.0000 mg | ORAL_TABLET | Freq: Once | ORAL | Status: AC
  Administered 2017-02-24: 650 mg via ORAL
  Filled 2017-02-24: qty 1

## 2017-02-24 MED ORDER — PANCRELIPASE (LIP-PROT-AMYL) 12000-38000 UNITS PO CPEP
12000.0000 [IU] | ORAL_CAPSULE | Freq: Once | ORAL | Status: DC
  Filled 2017-02-24: qty 1

## 2017-02-24 MED ORDER — PANCRELIPASE (LIP-PROT-AMYL) 12000-38000 UNITS PO CPEP
12000.0000 [IU] | ORAL_CAPSULE | Freq: Once | ORAL | Status: AC
  Administered 2017-02-24: 12000 [IU] via ORAL

## 2017-02-24 NOTE — Discharge Instructions (Signed)
As discussed, it is very important that he follow-up with your interventional radiologist to consider a replacement of the gastrostomy tube. Return here for concerning changes in your condition.

## 2017-02-24 NOTE — ED Notes (Signed)
Flushed Feeding tube with warm water, feeding tube no longer clogged,  pt tolerated well

## 2017-02-24 NOTE — ED Triage Notes (Signed)
Per EMS:  Patient from home.  Called out because home health could not get patient's feeding tube unclogged.  Hx of same recently.  Pt had massive stroke in June, has a trach, feeding tube, catheter, non-verbal.  NAD.  EMS states they heard "junky" lung sounds and patient "felt warm".

## 2017-02-24 NOTE — ED Provider Notes (Signed)
[name] EMERGENCY DEPARTMENT Provider Note   CSN: [id number] Arrival date & time: 02/24/17  1516     History   Chief Complaint Chief Complaint  Patient presents with  . Gastric Tube Issues  . Cough    HPI Scott Rivera is a 66 y.o. male.  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  He had displaced in the past summer due to stroke. Over the past few months he has had several occasions during which the tube has been blocked. He presents with his sister due to recurrent blockage of the tube which began yesterday. No other new complaints including fever, chills. On secondary questioning there is some description of increased respiratory effort, but no substantial cough.   Past Medical History:  Diagnosis Date  . Anemia due to chronic kidney disease   . Aspiration pneumonia ([place])   . Chronic kidney disease, stage 3 (moderate) (HCC)   . Hypertension   . Stroke [facility])     Patient Active Problem List   Diagnosis Date Noted  . Loculated pleural effusion   . Acute respiratory distress   . Fever   . Follow up   . Healthcare-associated pneumonia   . Stage III pressure ulcer of sacral region ([place]) 12/17/2016  . Hypoxemia   . Tracheostomy status ([place])   . Pneumothorax   . Pneumothorax, closed, traumatic, initial encounter 12/10/2016  . Sepsis ([place]) 12/09/2016  . Hypotension 12/09/2016  . Acute-on-chronic kidney injury ([place]) 12/09/2016  . Elevated troponin 12/09/2016  . Anemia due to chronic kidney disease 12/09/2016  . Compensated metabolic alkalosis 02/63/7858  . Aspiration pneumonia ([place])   . Goals of care, counseling/discussion   . Palliative care by specialist   . Respiratory failure ([place])   . Aspiration into airway   . Septic shock ([place])   . Dysphagia, post-stroke   . Benign essential HTN   . Tobacco abuse   . Tachypnea   . Hyperglycemia   .  Hypernatremia   . Leukocytosis (leucocytosis)   . Acute blood loss anemia   . Chronic kidney disease   . Pressure injury of skin 10/20/2016  . Cerebellar infarct ([place]) 10/19/2016  . ARF (acute renal failure) ([place]) 10/19/2016  . Essential hypertension 10/19/2016  . Rhabdomyolysis 10/19/2016  . Acute ischemic stroke ([place]) 10/19/2016  . Bilateral chronic knee pain 01/07/2016  . History of adenomatous polyp of colon 07/28/2015  . Hyperlipidemia 07/28/2013  . Helicobacter pylori infection 09/21/2012    Past Surgical History:  Procedure Laterality Date  . IR GASTROSTOMY TUBE MOD SED  10/27/2016  . IR PATIENT EVAL TECH 0-60 MINS  01/05/2017  . IR PATIENT EVAL TECH 0-60 MINS  01/09/2017  . KNEE SURGERY    . TRACHEOSTOMY TUBE PLACEMENT         Home Medications    Prior to Admission medications   Medication Sig Start Date End Date Taking? Authorizing Provider  acetaminophen (TYLENOL) 500 MG tablet Place 1,000 mg 3 (three) times daily into feeding tube.    Yes [provider]  amLODipine (NORVASC) 10 MG tablet Place 1 tablet (10 mg total) into feeding tube daily. 01/11/17  Yes Cherene Altes, MD  aspirin 325 MG tablet Place 1 tablet (325 mg total) into feeding tube daily. 01/11/17  Yes Cherene Altes, MD  cloNIDine (CATAPRES) 0.1 MG tablet Place 1 tablet (0.1 mg total) into feeding tube 3 (three) times  daily. 01/10/17  Yes Cherene Altes, MD  clopidogrel (PLAVIX) 75 MG tablet Place 1 tablet (75 mg total) into feeding tube daily. 01/11/17  Yes Cherene Altes, MD  famotidine (PEPCID) 40 MG tablet Place 40 mg daily into feeding tube.   Yes [provider]  gabapentin (NEURONTIN) 100 MG capsule Take 100 mg See admin instructions by mouth. Open 1 capsule (100 mg) and administer contents via feeding tube three times daily   Yes [provider]  ipratropium-albuterol (DUONEB) 0.5-2.5 (3) MG/3ML SOLN Take 3 mLs by nebulization every 4 (four) hours as  needed. Patient taking differently: Take 3 mLs every 4 (four) hours as needed by nebulization (shortness of breath/wheezing).  01/10/17  Yes Cherene Altes, MD  levofloxacin (LEVAQUIN) 750 MG tablet Take 1 tablet (750 mg total) by mouth daily. X 7 days Patient taking differently: Place 750 mg daily into feeding tube. X 7 day course ordered 02/09/17 and 7 day course ordered 02/20/17 02/09/17  Yes Delo, Nathaneil Canary, MD  metoprolol tartrate (LOPRESSOR) 25 MG tablet Place 25 mg 2 (two) times daily into feeding tube.    Yes [provider]  mouth rinse LIQD solution 15 mLs by Mouth Rinse route QID. Patient taking differently: 15 mLs by Mouth Rinse route 2 (two) times daily.  01/10/17  Yes Cherene Altes, MD  Nutritional Supplements (FEEDING SUPPLEMENT, JEVITY 1.2 CAL,) LIQD Place 1,000 mLs into feeding tube continuous. Patient taking differently: Place continuous into feeding tube.  01/10/17  Yes Cherene Altes, MD  pravastatin (PRAVACHOL) 20 MG tablet Place 1 tablet (20 mg total) into feeding tube daily at 6 PM. Patient taking differently: Place 20 mg daily into feeding tube.  01/10/17  Yes Cherene Altes, MD  thiamine 100 MG tablet Place 1 tablet (100 mg total) into feeding tube daily. 01/11/17  Yes Cherene Altes, MD  Water For Irrigation, Sterile (FREE WATER) SOLN Place 100 mLs into feeding tube every 3 (three) hours. Patient taking differently: Place 4 (four) times daily into feeding tube.  01/10/17  Yes Cherene Altes, MD  acetaminophen (TYLENOL) 325 MG tablet Place 2 tablets (650 mg total) into feeding tube every 6 (six) hours as needed for mild pain, fever or headache. Patient not taking: Reported on 02/17/2017 01/10/17   Cherene Altes, MD  chlorhexidine gluconate, MEDLINE KIT, (PERIDEX) 0.12 % solution 15 mLs by Mouth Rinse route 2 (two) times daily. Patient not taking: Reported on 02/18/2017 01/10/17   Cherene Altes, MD  famotidine (PEPCID) 40 MG/5ML suspension  Place 2.5 mLs (20 mg total) into feeding tube every 12 (twelve) hours. Patient not taking: Reported on 02/24/2017 01/10/17   Cherene Altes, MD  metoprolol tartrate (LOPRESSOR) 25 mg/10 mL SUSP Place 10 mLs (25 mg total) into feeding tube 2 (two) times daily. Patient not taking: Reported on 02/17/2017 01/10/17   Cherene Altes, MD  Multiple Vitamin (MULTIVITAMIN WITH MINERALS) TABS tablet Place 1 tablet into feeding tube daily. Patient not taking: Reported on 02/18/2017 01/11/17   Cherene Altes, MD    Family History Family History  Problem Relation Age of Onset  . Diabetes Mellitus II Brother   . CAD Neg Hx   . Stroke Neg Hx     Social History Social History   Tobacco Use  . Smoking status: Former Research scientist (life sciences)  . Smokeless tobacco: Never Used  . Tobacco comment: Smoked heavily until the day of his stroke  Substance Use Topics  . Alcohol  use: Yes    Comment: Drank heavily prior to stroke per sister  . Drug use: Not on file     Allergies   Patient has no known allergies.   Review of Systems Review of Systems  Unable to perform ROS: Patient nonverbal     Physical Exam Updated Vital Signs BP 140/72   Pulse (!) 107   Temp 100 F (37.8 C) (Rectal)   Ht _0  (1.676 m)   Wt 63.5 kg (140 lb)   SpO2 95%   BMI 22.60 kg/m   Physical Exam  Constitutional: He appears well-developed and well-nourished.  HENT:  Head: Normocephalic and atraumatic.  Eyes: Conjunctivae are normal.  Cardiovascular: Normal rate and regular rhythm.  No murmur heard. Pulmonary/Chest:  Trach in place.  After suctioning performed on arrival here the patient had clear breath sounds bilaterally  Abdominal: Soft. Bowel sounds are normal.  Feeding tube in place, appears clogged.  Will not flush.  Musculoskeletal: He exhibits no edema.  Substantial atrophy  Neurological: He is alert. He displays atrophy. He exhibits abnormal muscle tone.  Skin: Skin is warm and dry.  Psychiatric: Cognition and  memory are impaired.  Nursing note and vitals reviewed.     Radiology Dg Chest Port 1 View  Result Date: 02/24/2017 CLINICAL DATA:  66 y/o  M; shortness of breath. EXAM: PORTABLE CHEST 1 VIEW COMPARISON:  02/09/2017 chest radiograph. FINDINGS: Stable cardiac silhouette within normal limits given projection and technique. Tracheostomy tube in situ. Surgical clips project over the left medial clavicle. Coarse reticular opacities at left lung base is stable and probably represent scarring or atelectasis. No focal consolidation. No pleural effusion or pneumothorax. Multilevel degenerative changes of the spine. IMPRESSION: Stable coarse reticular opacities at left lung base probably representing scarring or atelectasis. No focal consolidation. Electronically Signed   By: Kristine Garbe M.D.   On: 02/24/2017 16:43    Procedures Procedures (including critical care time)  Medications Ordered in ED Medications  lipase/protease/amylase (CREON) capsule 12,000 Units (12,000 Units Oral Given 02/24/17 1653)    And  sodium bicarbonate tablet 650 mg (650 mg Oral Given 02/24/17 1653)  lipase/protease/amylase (CREON) capsule 12,000 Units (12,000 Units Oral Given 02/24/17 1656)     Initial Impression / Assessment and Plan / ED Course  I have reviewed the triage vital signs and the nursing notes.  Pertinent labs & imaging results that were available during my care of the patient were reviewed by me and considered in my medical decision making (see chart for details).  6:07 PM G-tube has been flushed, by myself and the nursing staff. And now has open passage. With no other complaints, unremarkable vital signs, no x-ray evidence of pneumonia, the patient is appropriate for discharge with outpatient follow-up. His sister was provided information on interventional radiology follow-up, as they were the service who placed the tube initially.   Final Clinical Impressions(s) / ED Diagnoses    Final diagnoses:  Feeding tube blocked, initial encounter    ED Discharge Orders    None       Carmin Muskrat, MD 02/24/17 1807

## 2017-02-24 NOTE — ED Notes (Signed)
Cleaned up patient, had a BM, light yellow liquid consistency, very small amount.

## 2017-03-02 ENCOUNTER — Inpatient Hospital Stay (HOSPITAL_BASED_OUTPATIENT_CLINIC_OR_DEPARTMENT_OTHER)
Admission: EM | Admit: 2017-03-02 | Discharge: 2017-03-07 | DRG: 189 | Disposition: A | Discharge status: Home Health | Payer: Medicare HMO | Attending: Family Medicine | Admitting: Family Medicine

## 2017-03-02 ENCOUNTER — Emergency Department (HOSPITAL_BASED_OUTPATIENT_CLINIC_OR_DEPARTMENT_OTHER): Payer: Medicare HMO

## 2017-03-02 ENCOUNTER — Encounter (HOSPITAL_BASED_OUTPATIENT_CLINIC_OR_DEPARTMENT_OTHER): Payer: Self-pay | Admitting: Emergency Medicine

## 2017-03-02 ENCOUNTER — Other Ambulatory Visit: Payer: Self-pay

## 2017-03-02 DIAGNOSIS — Z1624 Resistance to multiple antibiotics: Secondary | ICD-10-CM | POA: Diagnosis not present

## 2017-03-02 DIAGNOSIS — Z87891 Personal history of nicotine dependence: Secondary | ICD-10-CM

## 2017-03-02 DIAGNOSIS — J9601 Acute respiratory failure with hypoxia: Secondary | ICD-10-CM | POA: Diagnosis not present

## 2017-03-02 DIAGNOSIS — J690 Pneumonitis due to inhalation of food and vomit: Secondary | ICD-10-CM

## 2017-03-02 DIAGNOSIS — I129 Hypertensive chronic kidney disease with stage 1 through stage 4 chronic kidney disease, or unspecified chronic kidney disease: Secondary | ICD-10-CM | POA: Diagnosis present

## 2017-03-02 DIAGNOSIS — D72829 Elevated white blood cell count, unspecified: Secondary | ICD-10-CM | POA: Diagnosis present

## 2017-03-02 DIAGNOSIS — A499 Bacterial infection, unspecified: Secondary | ICD-10-CM

## 2017-03-02 DIAGNOSIS — K9429 Other complications of gastrostomy: Secondary | ICD-10-CM | POA: Diagnosis not present

## 2017-03-02 DIAGNOSIS — D631 Anemia in chronic kidney disease: Secondary | ICD-10-CM | POA: Diagnosis present

## 2017-03-02 DIAGNOSIS — I69320 Aphasia following cerebral infarction: Secondary | ICD-10-CM | POA: Diagnosis not present

## 2017-03-02 DIAGNOSIS — F0150 Vascular dementia without behavioral disturbance: Secondary | ICD-10-CM | POA: Diagnosis present

## 2017-03-02 DIAGNOSIS — J189 Pneumonia, unspecified organism: Secondary | ICD-10-CM | POA: Diagnosis present

## 2017-03-02 DIAGNOSIS — A498 Other bacterial infections of unspecified site: Secondary | ICD-10-CM

## 2017-03-02 DIAGNOSIS — K9423 Gastrostomy malfunction: Secondary | ICD-10-CM

## 2017-03-02 DIAGNOSIS — Y92009 Unspecified place in unspecified non-institutional (private) residence as the place of occurrence of the external cause: Secondary | ICD-10-CM

## 2017-03-02 DIAGNOSIS — Z9889 Other specified postprocedural states: Secondary | ICD-10-CM

## 2017-03-02 DIAGNOSIS — Z930 Tracheostomy status: Secondary | ICD-10-CM

## 2017-03-02 DIAGNOSIS — Y95 Nosocomial condition: Secondary | ICD-10-CM | POA: Diagnosis present

## 2017-03-02 DIAGNOSIS — Y838 Other surgical procedures as the cause of abnormal reaction of the patient, or of later complication, without mention of misadventure at the time of the procedure: Secondary | ICD-10-CM | POA: Diagnosis present

## 2017-03-02 DIAGNOSIS — Z7902 Long term (current) use of antithrombotics/antiplatelets: Secondary | ICD-10-CM | POA: Diagnosis not present

## 2017-03-02 DIAGNOSIS — Z7401 Bed confinement status: Secondary | ICD-10-CM

## 2017-03-02 DIAGNOSIS — Z7982 Long term (current) use of aspirin: Secondary | ICD-10-CM | POA: Diagnosis not present

## 2017-03-02 DIAGNOSIS — J90 Pleural effusion, not elsewhere classified: Secondary | ICD-10-CM | POA: Diagnosis present

## 2017-03-02 DIAGNOSIS — N183 Chronic kidney disease, stage 3 (moderate): Secondary | ICD-10-CM | POA: Diagnosis present

## 2017-03-02 DIAGNOSIS — J9621 Acute and chronic respiratory failure with hypoxia: Principal | ICD-10-CM | POA: Diagnosis present

## 2017-03-02 DIAGNOSIS — Z833 Family history of diabetes mellitus: Secondary | ICD-10-CM

## 2017-03-02 DIAGNOSIS — L89153 Pressure ulcer of sacral region, stage 3: Secondary | ICD-10-CM | POA: Diagnosis present

## 2017-03-02 LAB — CBC WITH DIFFERENTIAL/PLATELET
Basophils Absolute: 0 10*3/uL (ref 0.0–0.1)
Basophils Relative: 0 %
Eosinophils Absolute: 0.5 10*3/uL (ref 0.0–0.7)
Eosinophils Relative: 4 %
HCT: 31.5 % — ABNORMAL LOW (ref 39.0–52.0)
Hemoglobin: 10 g/dL — ABNORMAL LOW (ref 13.0–17.0)
Lymphocytes Relative: 13 %
Lymphs Abs: 1.8 10*3/uL (ref 0.7–4.0)
MCH: 27.9 pg (ref 26.0–34.0)
MCHC: 31.7 g/dL (ref 30.0–36.0)
MCV: 88 fL (ref 78.0–100.0)
Monocytes Absolute: 1.4 10*3/uL — ABNORMAL HIGH (ref 0.1–1.0)
Monocytes Relative: 10 %
Neutro Abs: 10 10*3/uL — ABNORMAL HIGH (ref 1.7–7.7)
Neutrophils Relative %: 73 %
Platelets: 673 10*3/uL — ABNORMAL HIGH (ref 150–400)
RBC: 3.58 MIL/uL — ABNORMAL LOW (ref 4.22–5.81)
RDW: 16.6 % — ABNORMAL HIGH (ref 11.5–15.5)
WBC: 13.7 10*3/uL — ABNORMAL HIGH (ref 4.0–10.5)

## 2017-03-02 LAB — I-STAT ARTERIAL BLOOD GAS, ED
Bicarbonate: 25.4 mmol/L (ref 20.0–28.0)
O2 Saturation: 86 %
Patient temperature: 99.1
TCO2: 27 mmol/L (ref 22–32)
pCO2 arterial: 43.7 mmHg (ref 32.0–48.0)
pH, Arterial: 7.373 (ref 7.350–7.450)
pO2, Arterial: 54 mmHg — ABNORMAL LOW (ref 83.0–108.0)

## 2017-03-02 LAB — COMPREHENSIVE METABOLIC PANEL
ALT: 28 U/L (ref 17–63)
AST: 21 U/L (ref 15–41)
Albumin: 2.8 g/dL — ABNORMAL LOW (ref 3.5–5.0)
Alkaline Phosphatase: 75 U/L (ref 38–126)
Anion gap: 7 (ref 5–15)
BUN: 34 mg/dL — ABNORMAL HIGH (ref 6–20)
CO2: 27 mmol/L (ref 22–32)
Calcium: 10.2 mg/dL (ref 8.9–10.3)
Chloride: 105 mmol/L (ref 101–111)
Creatinine, Ser: 0.95 mg/dL (ref 0.61–1.24)
GFR calc Af Amer: >60 mL/min (ref >60)
GFR calc non Af Amer: >60 mL/min (ref >60)
Glucose, Bld: 87 mg/dL (ref 65–99)
Potassium: 4.8 mmol/L (ref 3.5–5.1)
Sodium: 139 mmol/L (ref 135–145)
Total Bilirubin: 0.3 mg/dL (ref 0.3–1.2)
Total Protein: 8.3 g/dL — ABNORMAL HIGH (ref 6.5–8.1)

## 2017-03-02 LAB — TROPONIN I: Troponin I: <0.03 ng/mL (ref <0.03)

## 2017-03-02 LAB — BRAIN NATRIURETIC PEPTIDE: B Natriuretic Peptide: 11.8 pg/mL (ref 0.0–100.0)

## 2017-03-02 LAB — I-STAT CG4 LACTIC ACID, ED: Lactic Acid, Venous: 0.41 mmol/L — ABNORMAL LOW (ref 0.5–1.9)

## 2017-03-02 MED ORDER — ONDANSETRON HCL 4 MG PO TABS: 4.0000 mg | ORAL_TABLET | Freq: Four times a day (QID) | ORAL | Status: DC | PRN

## 2017-03-02 MED ORDER — SODIUM CHLORIDE 0.9 % IV SOLN
INTRAVENOUS | Status: AC
  Administered 2017-03-03: 100 mL via INTRAVENOUS

## 2017-03-02 MED ORDER — ACETAMINOPHEN 325 MG PO TABS
650.0000 mg | ORAL_TABLET | Freq: Four times a day (QID) | ORAL | Status: DC | PRN
  Administered 2017-03-05 – 2017-03-06 (×2): 650 mg via ORAL
  Filled 2017-03-02 (×2): qty 2

## 2017-03-02 MED ORDER — ACETAMINOPHEN 650 MG RE SUPP: 650.0000 mg | Freq: Four times a day (QID) | RECTAL | Status: DC | PRN

## 2017-03-02 MED ORDER — PIPERACILLIN-TAZOBACTAM 3.375 G IVPB
3.3750 g | Freq: Three times a day (TID) | INTRAVENOUS | Status: DC
  Administered 2017-03-03 – 2017-03-05 (×8): 3.375 g via INTRAVENOUS
  Filled 2017-03-02 (×9): qty 50

## 2017-03-02 MED ORDER — VANCOMYCIN HCL IN DEXTROSE 1-5 GM/200ML-% IV SOLN
1000.0000 mg | Freq: Once | INTRAVENOUS | Status: AC
  Administered 2017-03-02: 1000 mg via INTRAVENOUS
  Filled 2017-03-02: qty 200

## 2017-03-02 MED ORDER — ALBUTEROL SULFATE (2.5 MG/3ML) 0.083% IN NEBU: 2.5000 mg | INHALATION_SOLUTION | RESPIRATORY_TRACT | Status: DC | PRN

## 2017-03-02 MED ORDER — IOPAMIDOL (ISOVUE-300) INJECTION 61%
100.0000 mL | Freq: Once | INTRAVENOUS | Status: DC | PRN
Start: 2017-03-02 — End: 2017-03-02

## 2017-03-02 MED ORDER — ORAL CARE MOUTH RINSE
15.0000 mL | Freq: Two times a day (BID) | OROMUCOSAL | Status: DC
  Administered 2017-03-03 – 2017-03-07 (×10): 15 mL via OROMUCOSAL

## 2017-03-02 MED ORDER — IOPAMIDOL (ISOVUE-370) INJECTION 76%
100.0000 mL | Freq: Once | INTRAVENOUS | Status: AC | PRN
  Administered 2017-03-02: 100 mL via INTRAVENOUS

## 2017-03-02 MED ORDER — ONDANSETRON HCL 4 MG/2ML IJ SOLN: 4.0000 mg | Freq: Four times a day (QID) | INTRAMUSCULAR | Status: DC | PRN

## 2017-03-02 MED ORDER — SODIUM CHLORIDE 0.9 % IV BOLUS (SEPSIS)
2000.0000 mL | Freq: Once | INTRAVENOUS | Status: AC
  Administered 2017-03-02: 2000 mL via INTRAVENOUS

## 2017-03-02 MED ORDER — PIPERACILLIN-TAZOBACTAM 3.375 G IVPB 30 MIN
3.3750 g | Freq: Once | INTRAVENOUS | Status: AC
  Administered 2017-03-02: 3.375 g via INTRAVENOUS
  Filled 2017-03-02 (×2): qty 50

## 2017-03-02 NOTE — ED Notes (Signed)
Paged hospitalist via carelink @ 20:02

## 2017-03-02 NOTE — ED Notes (Signed)
Right Radial artery x 1attempt for ABG. No complications noted. Bleeding stopped.

## 2017-03-02 NOTE — ED Notes (Signed)
#  4 cuffed trach placed at bedside. Suction catheter at bedside. BMV at [facility]. Suction hooked up.

## 2017-03-02 NOTE — ED Triage Notes (Signed)
Per [facility] "patient feeding tube is busted".

## 2017-03-02 NOTE — ED Provider Notes (Signed)
[facility] EMERGENCY DEPARTMENT Provider Note   CSN: [id number] Arrival date & time: 03/02/17  1555     History   Chief Complaint Chief Complaint  Patient presents with  . Feeding Tube Problem    HPI ZARIAN COLPITTS is a 66 y.o. male hx of recent stroke with aphasia, G tube, trach here with G tube problem. Patient is currently living with sister at home with home health aide. His G tube has clotted multiple times and recently and declogged a week ago. The nursing staff noticed that the G tube is broken today and is unable to be used. Patient contracted, nonverbal, unable to give history.   The history is provided by the EMS personnel.   Level V caveat- nonverbal, previous stroke   Past Medical History:  Diagnosis Date  . Anemia due to chronic kidney disease   . Aspiration pneumonia ([place])   . Chronic kidney disease, stage 3 (moderate) (HCC)   . Hypertension   . Stroke [facility])     Patient Active Problem List   Diagnosis Date Noted  . Acute respiratory failure with hypoxia ([place]) 03/02/2017  . Loculated pleural effusion   . Acute respiratory distress   . Fever   . Follow up   . Healthcare-associated pneumonia   . Stage III pressure ulcer of sacral region ([place]) 12/17/2016  . Hypoxemia   . Tracheostomy status ([place])   . Pneumothorax   . Pneumothorax, closed, traumatic, initial encounter 12/10/2016  . Sepsis ([place]) 12/09/2016  . Hypotension 12/09/2016  . Acute-on-chronic kidney injury ([place]) 12/09/2016  . Elevated troponin 12/09/2016  . Anemia due to chronic kidney disease 12/09/2016  . Compensated metabolic alkalosis 01/65/5374  . Aspiration pneumonia ([place])   . Goals of care, counseling/discussion   . Palliative care by specialist   . Respiratory failure ([place])   . Aspiration into airway   . Septic shock ([place])   . Dysphagia, post-stroke   . Benign essential HTN   . Tobacco abuse   . Tachypnea   . Hyperglycemia   . Hypernatremia   . Leukocytosis  (leucocytosis)   . Acute blood loss anemia   . Chronic kidney disease   . Pressure injury of skin 10/20/2016  . Cerebellar infarct ([place]) 10/19/2016  . ARF (acute renal failure) ([place]) 10/19/2016  . Essential hypertension 10/19/2016  . Rhabdomyolysis 10/19/2016  . Acute ischemic stroke ([place]) 10/19/2016  . Bilateral chronic knee pain 01/07/2016  . History of adenomatous polyp of colon 07/28/2015  . Hyperlipidemia 07/28/2013  . Helicobacter pylori infection 09/21/2012    Past Surgical History:  Procedure Laterality Date  . IR GASTROSTOMY TUBE MOD SED  10/27/2016  . IR PATIENT EVAL TECH 0-60 MINS  01/05/2017  . IR PATIENT EVAL TECH 0-60 MINS  01/09/2017  . KNEE SURGERY    . TRACHEOSTOMY TUBE PLACEMENT         Home Medications    Prior to Admission medications   Medication Sig Start Date End Date Taking? Authorizing Provider  acetaminophen (TYLENOL) 325 MG tablet Place 2 tablets (650 mg total) into feeding tube every 6 (six) hours as needed for mild pain, fever or headache. Patient not taking: Reported on 02/17/2017 01/10/17   Cherene Altes, MD  acetaminophen (TYLENOL) 500 MG tablet Place 1,000 mg 3 (three) times daily into feeding tube.     [provider]  amLODipine (NORVASC) 10 MG tablet Place 1 tablet (10 mg total) into feeding tube daily. 01/11/17   Joette Catching  T, MD  aspirin 325 MG tablet Place 1 tablet (325 mg total) into feeding tube daily. 01/11/17   Cherene Altes, MD  chlorhexidine gluconate, MEDLINE KIT, (PERIDEX) 0.12 % solution 15 mLs by Mouth Rinse route 2 (two) times daily. Patient not taking: Reported on 02/18/2017 01/10/17   Cherene Altes, MD  cloNIDine (CATAPRES) 0.1 MG tablet Place 1 tablet (0.1 mg total) into feeding tube 3 (three) times daily. 01/10/17   Cherene Altes, MD  clopidogrel (PLAVIX) 75 MG tablet Place 1 tablet (75 mg total) into feeding tube daily. 01/11/17   Cherene Altes, MD  famotidine (PEPCID) 40 MG tablet Place 40 mg  daily into feeding tube.    [provider]  famotidine (PEPCID) 40 MG/5ML suspension Place 2.5 mLs (20 mg total) into feeding tube every 12 (twelve) hours. Patient not taking: Reported on 02/24/2017 01/10/17   Cherene Altes, MD  gabapentin (NEURONTIN) 100 MG capsule Take 100 mg See admin instructions by mouth. Open 1 capsule (100 mg) and administer contents via feeding tube three times daily    [provider]  ipratropium-albuterol (DUONEB) 0.5-2.5 (3) MG/3ML SOLN Take 3 mLs by nebulization every 4 (four) hours as needed. Patient taking differently: Take 3 mLs every 4 (four) hours as needed by nebulization (shortness of breath/wheezing).  01/10/17   Cherene Altes, MD  levofloxacin (LEVAQUIN) 750 MG tablet Take 1 tablet (750 mg total) by mouth daily. X 7 days Patient taking differently: Place 750 mg daily into feeding tube. X 7 day course ordered 02/09/17 and 7 day course ordered 02/20/17 02/09/17   Veryl Speak, MD  metoprolol tartrate (LOPRESSOR) 25 MG tablet Place 25 mg 2 (two) times daily into feeding tube.     [provider]  metoprolol tartrate (LOPRESSOR) 25 mg/10 mL SUSP Place 10 mLs (25 mg total) into feeding tube 2 (two) times daily. Patient not taking: Reported on 02/17/2017 01/10/17   Cherene Altes, MD  mouth rinse LIQD solution 15 mLs by Mouth Rinse route QID. Patient taking differently: 15 mLs by Mouth Rinse route 2 (two) times daily.  01/10/17   Cherene Altes, MD  Multiple Vitamin (MULTIVITAMIN WITH MINERALS) TABS tablet Place 1 tablet into feeding tube daily. Patient not taking: Reported on 02/18/2017 01/11/17   Cherene Altes, MD  Nutritional Supplements (FEEDING SUPPLEMENT, JEVITY 1.2 CAL,) LIQD Place 1,000 mLs into feeding tube continuous. Patient taking differently: Place continuous into feeding tube.  01/10/17   Cherene Altes, MD  pravastatin (PRAVACHOL) 20 MG tablet Place 1 tablet (20 mg total) into feeding tube daily at 6  PM. Patient taking differently: Place 20 mg daily into feeding tube.  01/10/17   Cherene Altes, MD  thiamine 100 MG tablet Place 1 tablet (100 mg total) into feeding tube daily. 01/11/17   Cherene Altes, MD  Water For Irrigation, Sterile (FREE WATER) SOLN Place 100 mLs into feeding tube every 3 (three) hours. Patient taking differently: Place 4 (four) times daily into feeding tube.  01/10/17   Cherene Altes, MD    Family History Family History  Problem Relation Age of Onset  . Diabetes Mellitus II Brother   . CAD Neg Hx   . Stroke Neg Hx     Social History Social History   Tobacco Use  . Smoking status: Former Research scientist (life sciences)  . Smokeless tobacco: Never Used  . Tobacco comment: Smoked heavily until the day of his stroke  Substance Use Topics  .  Alcohol use: Yes    Comment: Drank heavily prior to stroke per sister  . Drug use: Not on file     Allergies   Patient has no known allergies.   Review of Systems Review of Systems  Gastrointestinal:       G tube clogged   All other systems reviewed and are negative.    Physical Exam Updated Vital Signs BP 104/79   Pulse 78   Temp 99.2 F (37.3 C) (Rectal)   Resp 17   SpO2 100%   Physical Exam  Constitutional:  Chronically ill, nonverbal   HENT:  Head: Normocephalic.  Eyes: EOM are normal. Pupils are equal, round, and reactive to light.  Neck:  Trach present, on oxygen (baseline)   Cardiovascular: Normal rate, regular rhythm and normal heart sounds.  Pulmonary/Chest:  Diminished bilateral bases   Abdominal:  Soft. G tube with broken tubing.   Musculoskeletal: Normal range of motion.  Neurological:  Alert, nonverbal. Contracted   Skin: Skin is warm.  Psychiatric:  Unable   Nursing note and vitals reviewed.    ED Treatments / Results  Labs (all labs ordered are listed, but only abnormal results are displayed) Labs Reviewed  CBC WITH DIFFERENTIAL/PLATELET - Abnormal; Notable for the following  components:      Result Value   WBC 13.7 (*)    RBC 3.58 (*)    Hemoglobin 10.0 (*)    HCT 31.5 (*)    RDW 16.6 (*)    Platelets 673 (*)    Neutro Abs 10.0 (*)    Monocytes Absolute 1.4 (*)    All other components within normal limits  COMPREHENSIVE METABOLIC PANEL - Abnormal; Notable for the following components:   BUN 34 (*)    Total Protein 8.3 (*)    Albumin 2.8 (*)    All other components within normal limits  I-STAT CG4 LACTIC ACID, ED - Abnormal; Notable for the following components:   Lactic Acid, Venous 0.41 (*)    All other components within normal limits  I-STAT ARTERIAL BLOOD GAS, ED - Abnormal; Notable for the following components:   pO2, Arterial 54.0 (*)    All other components within normal limits  CULTURE, BLOOD (ROUTINE X 2)  CULTURE, BLOOD (ROUTINE X 2)  CULTURE, EXPECTORATED SPUTUM-ASSESSMENT  BRAIN NATRIURETIC PEPTIDE  TROPONIN I  BLOOD GAS, ARTERIAL  I-STAT CG4 LACTIC ACID, ED    EKG  EKG Interpretation  Date/Time:  Friday March 02 2017 18:58:22 EST Ventricular Rate:  82 PR Interval:    QRS Duration: 97 QT Interval:  342 QTC Calculation: 400 R Axis:   80 Text Interpretation:  Sinus rhythm RSR' in V1 or V2, right VCD or RVH ST elevation suggests acute pericarditis previous wide complex tachycardia resolved Confirmed by Wandra Arthurs [phone]) on 03/02/2017 8:31:31 PM       Radiology Ct Angio Chest Pe W And/or Wo Contrast  Result Date: 03/02/2017 CLINICAL DATA:  66 year old male with tracheostomy fluid and concern for fracture of tracheostomy. Patient presenting with respiratory findings concerning for PE. EXAM: CT ANGIOGRAPHY CHEST WITH CONTRAST TECHNIQUE: Multidetector CT imaging of the chest was performed using the standard protocol during bolus administration of intravenous contrast. Multiplanar CT image reconstructions and MIPs were obtained to evaluate the vascular anatomy. CONTRAST:  15m ISOVUE-370 IOPAMIDOL (ISOVUE-370) INJECTION 76%  COMPARISON:  Chest radiograph dated 03/02/2017 FINDINGS: Cardiovascular: There is no cardiomegaly or pericardial effusion. Coronary vascular calcification primarily involving the LAD. Focal area of coarse  calcification in the anterior pericardium similar to prior CT. There is moderate atherosclerotic calcification of the thoracic aorta. There is atherosclerotic calcification of the origin of the left common carotid artery. The visualized proximal portion of the left vertebral artery appears occluded, age indeterminate, likely chronic. If there is clinical concern for acute conclusion or acute infarct further evaluation with CT angiography of the head and neck recommended. Evaluation of the pulmonary arteries is somewhat limited due to respiratory motion artifact and suboptimal visualization of the peripheral branches. No CT evidence of pulmonary artery embolus in the visualized segment of the vasculature. Mediastinum/Nodes: There is no hilar or mediastinal adenopathy. The esophagus is grossly unremarkable. The left thyroid gland is not visualized, likely surgically absent. Multiple mildly enlarged supraclavicular lymph nodes noted bilaterally measuring up to 12 mm on the left. Clinical correlation is recommended. Lungs/Pleura: There is a background of mild emphysema. Bibasilar linear atelectasis/scarring noted. There is a small loculated appearing pleural effusion in the left upper lobe posteriorly extending to the lingula. A small pleural effusion is seen in the left lower lobe similar to prior CT. Overall there has been improved aeration of the left lung base compared to the prior CT. There is no pneumothorax. Mucus secretion material noted in the trachea and bilateral mainstem bronchi. A tracheostomy is noted with tip above the carina. Upper Abdomen: An ill-defined 12 mm low attenuating focus in the anterior right lobe of the liver is not well characterized. Evaluation of the upper abdomen is limited due to  streak artifact caused by patient's arms. Musculoskeletal: There is degenerative changes of the spine. 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 No acute osseous pathology. Review of the MIP images confirms the above findings. IMPRESSION: 1. No CT evidence of central pulmonary artery embolus. 2. Loculated pleural effusion in the left upper lobe as well as a small left lower lobe pleural effusion similar to prior CT. Overall improvement of the aeration of the left lung base compared to the prior CT. 3. Tracheostomy with tip above the carina. There is mucus secretion content within the central airways. 4. Aortic Atherosclerosis (ICD10-I70.0) and Emphysema (ICD10-J43.9). 5. Mildly enlarged bilateral supraclavicular lymph nodes. Clinical correlation is recommended. Electronically Signed   By: Anner Crete M.D.   On: 03/02/2017 21:15   Dg Chest Portable 1 View  Result Date: 03/02/2017 CLINICAL DATA:  Cough and congestion. History of pleural effusion. Tracheostomy. EXAM: PORTABLE CHEST 1 VIEW COMPARISON:  Chest x-rays dated 02/24/2017 and 01/07/2017. FINDINGS: Heart size and mediastinal contours are stable. Tracheostomy tube appears appropriately positioned in the midline. Interstitial markings are more prominent on today's study suggesting interstitial edema. Persistent opacity along the left lateral chest wall, compatible with the loculated appearing effusion demonstrated on chest CT of 12/29/2016. IMPRESSION: 1. Probable bilateral interstitial edema suggesting mild CHF/volume overload. 2. Persistent opacity along the left lateral chest wall, similar to earlier chest x-rays of 01/07/2017 and 12/31/2016, compatible with the loculated appearing pleural effusion demonstrated on chest CT of 12/29/2016. 3. Tracheostomy tube appears appropriately positioned in the midline. Electronically Signed   By: Franki Cabot M.D.   On: 03/02/2017 17:56    Procedures Procedures (including critical care  time)  Angiocath insertion Performed by: Wandra Arthurs  Consent: Verbal consent obtained. Risks and benefits: risks, benefits and alternatives were discussed Time out: Immediately prior to procedure a "time out" was called to verify the correct patient, procedure, equipment, support staff and site/side marked as required.  Preparation: Patient was prepped  and draped in the usual sterile fashion.  Vein Location: R brachial  Ultrasound Guided  Gauge: 20 long  Normal blood return and flush without difficulty Patient tolerance: Patient tolerated the procedure well with no immediate complications.  CRITICAL CARE Performed by: Wandra Arthurs   Total critical care time: 30 minutes  Critical care time was exclusive of separately billable procedures and treating other patients.  Critical care was necessary to treat or prevent imminent or life-threatening deterioration.  Critical care was time spent personally by me on the following activities: development of treatment plan with patient and/or surrogate as well as nursing, discussions with consultants, evaluation of patient's response to treatment, examination of patient, obtaining history from patient or surrogate, ordering and performing treatments and interventions, ordering and review of laboratory studies, ordering and review of radiographic studies, pulse oximetry and re-evaluation of patient's condition.    Medications Ordered in ED Medications  vancomycin (VANCOCIN) IVPB 1000 mg/200 mL premix (1,000 mg Intravenous New Bag/Given 03/02/17 2046)  piperacillin-tazobactam (ZOSYN) IVPB 3.375 g (0 g Intravenous Stopped 03/02/17 2040)  sodium chloride 0.9 % bolus 2,000 mL (2,000 mLs Intravenous New Bag/Given 03/02/17 1949)  iopamidol (ISOVUE-370) 76 % injection 100 mL (100 mLs Intravenous Contrast Given 03/02/17 2016)     Initial Impression / Assessment and Plan / ED Course  I have reviewed the triage vital signs and the nursing  notes.  Pertinent labs & imaging results that were available during my care of the patient were reviewed by me and considered in my medical decision making (see chart for details).     NEHEMIAH MCFARREN is a 66 y.o. male here with G tube malfunction. G tube was placed by IR in July. It required declogging multiple times and now the port is broken off, unclear if from the previous declogging attempts or from accidental trauma at nursing home. I called Dr. Sharmon Revere assistant at [facility]. She states that there is an attachment that radiology has to attach it. Will check labs as well.   6:30 pm Family came back from [place] and brought the attachment. I noticed that the tube clogged again. I tried to use coca cola but was unsuccessful. I called Dr. Jarvis Newcomer from IR who will do outpatient IR guided G tube replacement at [facility].   7 pm Patient started to desat to 63s. I talked to family. Per the sister, patient is not on oxygen at home. Placed on 30 % venti with trach. ABG showed pH 7.4. PO2 54%. Will increase oxygen. CXR showed pulmonary edema vs pneumonia with persistent loculated effusion. Will add lactate, cultures, BNP. Will give vanc/zosyn empirically.   9:33 PM BNP nl. Given vanc/zosyn. Increase O2 to 40% venti. Will admit to hospitalist. Anticipate G tube replacement in AM by IR.   9:33 PM Dr. Roel Cluck accepting.CTA showed no PE. Just persistent loculated effusion. He required frequent suctioning. Hospitalist request sputum culture. Given vanc/zosyyn. Will admit to stepdown.     Final Clinical Impressions(s) / ED Diagnoses   Final diagnoses:  Gastrostomy tube obstruction [facility])    ED Discharge Orders        Ordered    IR GASTROSTOMY TUBE REMOVAL     03/02/17 1832       Drenda Freeze, MD 03/02/17 2133

## 2017-03-02 NOTE — ED Notes (Signed)
ED Provider at bedside. 

## 2017-03-02 NOTE — Progress Notes (Signed)
Received patient and placed on 40% ATC .  BMV at [facility] and awaiting spare trach from central per nursing staff.  Suctioned for copious amounts of secretions.

## 2017-03-02 NOTE — H&P (Signed)
Scott Rivera JSH:[id number] DOB: [birth year] DOA: 03/02/2017     PCP: Patient, No Pcp Per   Outpatient Specialists: [facility] Patient coming from: He is from home Lives With family   Chief Complaint: PEg tube dislodged  HPI: DRAYCEN Rivera is a 66 y.o. 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 Today he presented to [facility] with clogged feeding tube. Multiple attempts were made to declog it by ER  staff. The plan was for patient to have tube exchange by IR in a.m. when he was noted to be hypoxic. Patient was placed on 40% FiO2 using tracheostomy copious secretions was noted requiring repeated suctioning. Patient nonverbal unable to provide his own history but per family no recent fevers or chills. Patient has history of recurrent aspiration pneumonia. He was started on IV antibiotics treat possibly recurrent aspiration and transferred to [facility] long step down for further management Regarding pertinent Chronic problems: CVA in 10/2016  now status post tracheostomy and PEG tube has been out of rehabilitation for the past 2 weeks  Of note patient was started on Levaquin on November 6 for presumed aspiration pneumonia based on abnormal lung sounds.   IN ER:  Temp (24hrs), Avg:99.2 F (37.3 C), Min:99.1 F (37.3 C), Max:99.2 F (37.3 C)      on arrival  ED Triage Vitals  Enc Vitals Group     BP 03/02/17 1558 (!) 107/57     Pulse Rate 03/02/17 1558 83     Resp 03/02/17 1558 20     Temp 03/02/17 1558 99.1 F (37.3 C)     Temp Source 03/02/17 1558 Axillary     SpO2 03/02/17 1555 92 %     Weight --      Height --      Head  Circumference --      Peak Flow --      Pain Score --      Pain Loc --      Pain Edu? --      Excl. in [place]? --     Latest RR 18 98% on 40% Trop <0.03 LActic acid 0.41 ART 7.373/43.7/54 WBC 13.7 Hg 10 PLT 673 Na 139 K 4.8Cr 0.95 Alb 2.8 CXR: CT chest no PE, Mucus in central airway  Following Medications were ordered in ER: Medications  vancomycin (VANCOCIN) IVPB 1000 mg/200 mL premix (0 mg Intravenous Stopped 03/02/17 2147)  piperacillin-tazobactam (ZOSYN) IVPB 3.375 g (0 g Intravenous Stopped 03/02/17 2040)  sodium chloride 0.9 % bolus 2,000 mL (2,000 mLs Intravenous Transfusing/Transfer 03/02/17 2206)  iopamidol (ISOVUE-370) 76 % injection 100 mL (100 mLs Intravenous Contrast Given 03/02/17 2016)     ER provider discussed case with:  IR   We'll see patient in consult in the morning    Hospitalist was called for admission for acute respiratory failure with hypoxia secondary to possible aspirations versus increased secretions  Review of Systems:    Pertinent positives include: Unable to obtain secondary to patient being nonverbal  Past Medical History: Past Medical History:  Diagnosis Date  . Anemia due to chronic kidney disease   . Aspiration pneumonia ([place])   . Chronic kidney disease, stage 3 (moderate) (  [place])   . Hypertension   . Stroke [facility])    Past Surgical History:  Procedure Laterality Date  . IR GASTROSTOMY TUBE MOD SED  10/27/2016  . IR PATIENT EVAL TECH 0-60 MINS  01/05/2017  . IR PATIENT EVAL TECH 0-60 MINS  01/09/2017  . KNEE SURGERY    . TRACHEOSTOMY TUBE PLACEMENT       Social History:  Ambulatory  bed bound    reports that he has quit smoking. he has never used smokeless tobacco. He reports that he drinks alcohol. His drug history is not on file.  Allergies:  No Known Allergies     Family History:   Family History  Problem Relation Age of Onset  . Diabetes Mellitus II Brother   . CAD Neg Hx   . Stroke Neg Hx     Medications: Prior to  Admission medications   Medication Sig Start Date End Date Taking? Authorizing Provider  acetaminophen (TYLENOL) 325 MG tablet Place 2 tablets (650 mg total) into feeding tube every 6 (six) hours as needed for mild pain, fever or headache. Patient not taking: Reported on 02/17/2017 01/10/17   Cherene Altes, MD  acetaminophen (TYLENOL) 500 MG tablet Place 1,000 mg 3 (three) times daily into feeding tube.     [provider]  amLODipine (NORVASC) 10 MG tablet Place 1 tablet (10 mg total) into feeding tube daily. 01/11/17   Cherene Altes, MD  aspirin 325 MG tablet Place 1 tablet (325 mg total) into feeding tube daily. 01/11/17   Cherene Altes, MD  chlorhexidine gluconate, MEDLINE KIT, (PERIDEX) 0.12 % solution 15 mLs by Mouth Rinse route 2 (two) times daily. Patient not taking: Reported on 02/18/2017 01/10/17   Cherene Altes, MD  cloNIDine (CATAPRES) 0.1 MG tablet Place 1 tablet (0.1 mg total) into feeding tube 3 (three) times daily. 01/10/17   Cherene Altes, MD  clopidogrel (PLAVIX) 75 MG tablet Place 1 tablet (75 mg total) into feeding tube daily. 01/11/17   Cherene Altes, MD  famotidine (PEPCID) 40 MG tablet Place 40 mg daily into feeding tube.    [provider]  famotidine (PEPCID) 40 MG/5ML suspension Place 2.5 mLs (20 mg total) into feeding tube every 12 (twelve) hours. Patient not taking: Reported on 02/24/2017 01/10/17   Cherene Altes, MD  gabapentin (NEURONTIN) 100 MG capsule Take 100 mg See admin instructions by mouth. Open 1 capsule (100 mg) and administer contents via feeding tube three times daily    [provider]  ipratropium-albuterol (DUONEB) 0.5-2.5 (3) MG/3ML SOLN Take 3 mLs by nebulization every 4 (four) hours as needed. Patient taking differently: Take 3 mLs every 4 (four) hours as needed by nebulization (shortness of breath/wheezing).  01/10/17   Cherene Altes, MD  levofloxacin (LEVAQUIN) 750 MG tablet Take 1 tablet (750  mg total) by mouth daily. X 7 days Patient taking differently: Place 750 mg daily into feeding tube. X 7 day course ordered 02/09/17 and 7 day course ordered 02/20/17 02/09/17   Veryl Speak, MD  metoprolol tartrate (LOPRESSOR) 25 MG tablet Place 25 mg 2 (two) times daily into feeding tube.     [provider]  metoprolol tartrate (LOPRESSOR) 25 mg/10 mL SUSP Place 10 mLs (25 mg total) into feeding tube 2 (two) times daily. Patient not taking: Reported on 02/17/2017 01/10/17   Cherene Altes, MD  mouth rinse LIQD solution 15 mLs by Mouth Rinse route QID. Patient taking differently: 15  mLs by Mouth Rinse route 2 (two) times daily.  01/10/17   Cherene Altes, MD  Multiple Vitamin (MULTIVITAMIN WITH MINERALS) TABS tablet Place 1 tablet into feeding tube daily. Patient not taking: Reported on 02/18/2017 01/11/17   Cherene Altes, MD  Nutritional Supplements (FEEDING SUPPLEMENT, JEVITY 1.2 CAL,) LIQD Place 1,000 mLs into feeding tube continuous. Patient taking differently: Place continuous into feeding tube.  01/10/17   Cherene Altes, MD  pravastatin (PRAVACHOL) 20 MG tablet Place 1 tablet (20 mg total) into feeding tube daily at 6 PM. Patient taking differently: Place 20 mg daily into feeding tube.  01/10/17   Cherene Altes, MD  thiamine 100 MG tablet Place 1 tablet (100 mg total) into feeding tube daily. 01/11/17   Cherene Altes, MD  Water For Irrigation, Sterile (FREE WATER) SOLN Place 100 mLs into feeding tube every 3 (three) hours. Patient taking differently: Place 4 (four) times daily into feeding tube.  01/10/17   Cherene Altes, MD    Physical Exam: Patient Vitals for the past 24 hrs:  BP Temp Temp src Pulse Resp SpO2  03/02/17 2300 - - - - - 98 %  03/02/17 2200 138/67 - - 79 18 100 %  03/02/17 2102 104/79 - - 78 17 100 %  03/02/17 2100 134/79 - - 80 (!) 22 100 %  03/02/17 2040 129/60 99.2 F (37.3 C) Rectal 87 (!) 22 98 %  03/02/17 2010 - - - 82 19 100 %    03/02/17 2005 135/64 - - 81 (!) 25 100 %  03/02/17 1851 - - - - - 93 %  03/02/17 1847 122/60 - - 83 16 (!) 88 %  03/02/17 1558 (!) 107/57 99.1 F (37.3 C) Axillary 83 20 94 %  03/02/17 1555 - - - - - 92 %    1. General:  in No Acute distress  Chronically ill  -appearing 2. Psychological: Alert and  Oriented 3. Head/ENT:   Dry Mucous Membranes                          Head Non traumatic, neck supple                            Poor Dentition 4. SKIN:   decreased Skin turgor,  Skin clean Dry and intact no rash 5. Heart: Regular rate and rhythm no Murmur, no Rub or gallop 6. Lungs:   no wheezes or crackles  coarse breath sounds 7. Abdomen: Soft,  non-tender, Non distended  bowel sounds present, PEG in PLACE no evidence of infection 8. Lower extremities: no clubbing, cyanosis, or edema 9. Neurologically: quadroplegic 10. MSK: Normal range of motion   body mass index is unknown because there is no height or weight on file.  Labs on Admission:   Labs on Admission: I have personally reviewed following labs and imaging studies  CBC: Recent Labs  Lab 03/02/17 1643  WBC 13.7*  NEUTROABS 10.0*  HGB 10.0*  HCT 31.5*  MCV 88.0  PLT 030*   Basic Metabolic Panel: Recent Labs  Lab 03/02/17 1643  NA 139  K 4.8  CL 105  CO2 27  GLUCOSE 87  BUN 34*  CREATININE 0.95  CALCIUM 10.2   GFR: Estimated Creatinine Clearance: 68.7 mL/min (by C-G formula based on SCr of 0.95 mg/dL). Liver Function Tests: Recent Labs  Lab 03/02/17 1643  AST  21  ALT 28  ALKPHOS 75  BILITOT 0.3  PROT 8.3*  ALBUMIN 2.8*   No results for input(s): LIPASE, AMYLASE in the last 168 hours. No results for input(s): AMMONIA in the last 168 hours. Coagulation Profile: No results for input(s): INR, PROTIME in the last 168 hours. Cardiac Enzymes: Recent Labs  Lab 03/02/17 1952  TROPONINI <0.03   BNP (last 3 results) No results for input(s): PROBNP in the last 8760 hours. HbA1C: No results for  input(s): HGBA1C in the last 72 hours. CBG: No results for input(s): GLUCAP in the last 168 hours. Lipid Profile: No results for input(s): CHOL, HDL, LDLCALC, TRIG, CHOLHDL, LDLDIRECT in the last 72 hours. Thyroid Function Tests: No results for input(s): TSH, T4TOTAL, FREET4, T3FREE, THYROIDAB in the last 72 hours. Anemia Panel: No results for input(s): VITAMINB12, FOLATE, FERRITIN, TIBC, IRON, RETICCTPCT in the last 72 hours. Urine analysis:  Sepsis Labs: '@LABRCNTIP'$ (procalcitonin:4,lacticidven:4) )No results found for this or any previous visit (from the past 240 hour(s)).    UA not ordered  Lab Results  Component Value Date   HGBA1C 5.7 (H) 12/09/2016    Estimated Creatinine Clearance: 68.7 mL/min (by C-G formula based on SCr of 0.95 mg/dL).  BNP (last 3 results) No results for input(s): PROBNP in the last 8760 hours.   ECG REPORT  Independently reviewed Rate:82  Rhythm: NSR ST&T Change: No acute ischemic changes  QTC 400  There were no vitals filed for this visit.   Cultures:    Component Value Date/Time   SDES BLOOD BLOOD RIGHT HAND 01/07/2017 1500   SPECREQUEST IN PEDIATRIC BOTTLE Blood Culture adequate volume 01/07/2017 1500   CULT NO GROWTH 5 DAYS 01/07/2017 1500   REPTSTATUS 01/12/2017 FINAL 01/07/2017 1500     Radiological Exams on Admission: Ct Angio Chest Pe W And/or Wo Contrast  Addendum Date: 03/02/2017   ADDENDUM REPORT: 03/02/2017 21:38 ADDENDUM: There is occluded appearance of the visualized origin of the left vertebral artery, age indeterminate, possibly chronic. If there is clinical concern for acute occlusion or acute infarct further evaluation with CT angiography of the head and neck recommended. Electronically Signed   By: Anner Crete M.D.   On: 03/02/2017 21:38   Result Date: 03/02/2017 CLINICAL DATA:  66 year old male with tracheostomy fluid and concern for fracture of tracheostomy. Patient presenting with respiratory findings concerning  for PE. EXAM: CT ANGIOGRAPHY CHEST WITH CONTRAST TECHNIQUE: Multidetector CT imaging of the chest was performed using the standard protocol during bolus administration of intravenous contrast. Multiplanar CT image reconstructions and MIPs were obtained to evaluate the vascular anatomy. CONTRAST:  112m ISOVUE-370 IOPAMIDOL (ISOVUE-370) INJECTION 76% COMPARISON:  Chest radiograph dated 03/02/2017 FINDINGS: Cardiovascular: There is no cardiomegaly or pericardial effusion. Coronary vascular calcification primarily involving the LAD. Focal area of coarse calcification in the anterior pericardium similar to prior CT. There is moderate atherosclerotic calcification of the thoracic aorta. There is atherosclerotic calcification of the origin of the left common carotid artery. The visualized proximal portion of the left vertebral artery appears occluded, age indeterminate, likely chronic. If there is clinical concern for acute conclusion or acute infarct further evaluation with CT angiography of the head and neck recommended. Evaluation of the pulmonary arteries is somewhat limited due to respiratory motion artifact and suboptimal visualization of the peripheral branches. No CT evidence of pulmonary artery embolus in the visualized segment of the vasculature. Mediastinum/Nodes: There is no hilar or mediastinal adenopathy. The esophagus is grossly unremarkable. The left thyroid gland is not  visualized, likely surgically absent. Multiple mildly enlarged supraclavicular lymph nodes noted bilaterally measuring up to 12 mm on the left. Clinical correlation is recommended. Lungs/Pleura: There is a background of mild emphysema. Bibasilar linear atelectasis/scarring noted. There is a small loculated appearing pleural effusion in the left upper lobe posteriorly extending to the lingula. A small pleural effusion is seen in the left lower lobe similar to prior CT. Overall there has been improved aeration of the left lung base compared  to the prior CT. There is no pneumothorax. Mucus secretion material noted in the trachea and bilateral mainstem bronchi. A tracheostomy is noted with tip above the carina. Upper Abdomen: An ill-defined 12 mm low attenuating focus in the anterior right lobe of the liver is not well characterized. Evaluation of the upper abdomen is limited due to streak artifact caused by patient's arms. Musculoskeletal: There is degenerative changes of the spine. 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 No acute osseous pathology. Review of the MIP images confirms the above findings. IMPRESSION: 1. No CT evidence of central pulmonary artery embolus. 2. Loculated pleural effusion in the left upper lobe as well as a small left lower lobe pleural effusion similar to prior CT. Overall improvement of the aeration of the left lung base compared to the prior CT. 3. Tracheostomy with tip above the carina. There is mucus secretion content within the central airways. 4. Aortic Atherosclerosis (ICD10-I70.0) and Emphysema (ICD10-J43.9). 5. Mildly enlarged bilateral supraclavicular lymph nodes. Clinical correlation is recommended. Electronically Signed: By: Anner Crete M.D. On: 03/02/2017 21:15   Dg Chest Portable 1 View  Result Date: 03/02/2017 CLINICAL DATA:  Cough and congestion. History of pleural effusion. Tracheostomy. EXAM: PORTABLE CHEST 1 VIEW COMPARISON:  Chest x-rays dated 02/24/2017 and 01/07/2017. FINDINGS: Heart size and mediastinal contours are stable. Tracheostomy tube appears appropriately positioned in the midline. Interstitial markings are more prominent on today's study suggesting interstitial edema. Persistent opacity along the left lateral chest wall, compatible with the loculated appearing effusion demonstrated on chest CT of 12/29/2016. IMPRESSION: 1. Probable bilateral interstitial edema suggesting mild CHF/volume overload. 2. Persistent opacity along the left lateral chest wall, similar to  earlier chest x-rays of 01/07/2017 and 12/31/2016, compatible with the loculated appearing pleural effusion demonstrated on chest CT of 12/29/2016. 3. Tracheostomy tube appears appropriately positioned in the midline. Electronically Signed   By: Franki Cabot M.D.   On: 03/02/2017 17:56    Chart has been reviewed    Assessment/Plan  66 y.o. 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 Acute respiratory failure with hypoxia (HCC) - l;ikley due to recurrent aspiration vs increased secretions, respiratory consult for pulmonary secretions management. Keep NPO, treat for aspiration PNA, obtain sputum cultures . Stage III pressure ulcer of sacral region ([place]) - resolving, continue to monitor . Loculated pleural effusion - chronic, mild currently afebrile, unlikely to be contributing to hypoxia . Aspiration pneumonia ([place]) - cover with zosyn/vanc, await result of sputum culture.  HX of CVA - resume home medications when new functioning PEG is in place.   PEG tube malfunction - IR consult has been ordered, IR is aware of the patient   Other plan as  per orders.  DVT prophylaxis:  SCD    Code Status:  FULL CODE  s per  family   Family Communication:   Family not  at  Bedside    Disposition Plan:   To home once workup is complete and patient is stable                                             Consults called: IR consulted  Admission status:  obs   Level of care  SDU      I have spent a total of 56 min on this admission   Nivedita Mirabella 03/03/2017, 12:45 AM    [facility]    Pager [phone]   after 2 AM please page floor coverage PA If 7AM-7PM, please contact the day team taking care of the patient  [email or web address]  Password [email or web address]

## 2017-03-02 NOTE — Plan of Care (Signed)
66 yo Scott Rivera  M CVA in July trach PEG, came in for PEG tube repalcement. Initially put in by IR. The plan was have him transferred to have PEG exchanged in AM In ER started to Desat 80% on 40% trach color.  Purulent mucus increased trach secretion  CXR - CHF vs pleural effusion,loculated effusion. Old  WBC 14 Lactic acid wnl Afebrile Suspect aspiration.  Started on VAnc zosyn Cultures ordered.  CT chest ordered   Accept to stepdown bed inpatient Selenne Coggin 8:34 PM

## 2017-03-03 ENCOUNTER — Inpatient Hospital Stay (HOSPITAL_COMMUNITY): Payer: Medicare HMO

## 2017-03-03 ENCOUNTER — Encounter (HOSPITAL_COMMUNITY): Payer: Self-pay | Admitting: Internal Medicine

## 2017-03-03 DIAGNOSIS — K9429 Other complications of gastrostomy: Secondary | ICD-10-CM

## 2017-03-03 DIAGNOSIS — K9423 Gastrostomy malfunction: Secondary | ICD-10-CM

## 2017-03-03 HISTORY — PX: IR PATIENT EVAL TECH 0-60 MINS: IMG5564

## 2017-03-03 LAB — COMPREHENSIVE METABOLIC PANEL
ALT: 18 U/L (ref 17–63)
AST: 17 U/L (ref 15–41)
Albumin: 2.7 g/dL — ABNORMAL LOW (ref 3.5–5.0)
Alkaline Phosphatase: 76 U/L (ref 38–126)
Anion gap: 10 (ref 5–15)
BUN: 25 mg/dL — ABNORMAL HIGH (ref 6–20)
CO2: 24 mmol/L (ref 22–32)
Calcium: 10 mg/dL (ref 8.9–10.3)
Chloride: 107 mmol/L (ref 101–111)
Creatinine, Ser: 1.08 mg/dL (ref 0.61–1.24)
GFR calc Af Amer: >60 mL/min (ref >60)
GFR calc non Af Amer: >60 mL/min (ref >60)
Glucose, Bld: 80 mg/dL (ref 65–99)
Potassium: 4.1 mmol/L (ref 3.5–5.1)
Sodium: 141 mmol/L (ref 135–145)
Total Bilirubin: 0.6 mg/dL (ref 0.3–1.2)
Total Protein: 8.1 g/dL (ref 6.5–8.1)

## 2017-03-03 LAB — INFLUENZA PANEL BY PCR (TYPE A & B)
Influenza A By PCR: NEGATIVE
Influenza B By PCR: NEGATIVE

## 2017-03-03 LAB — CBC
HCT: 33 % — ABNORMAL LOW (ref 39.0–52.0)
Hemoglobin: 10.3 g/dL — ABNORMAL LOW (ref 13.0–17.0)
MCH: 27.8 pg (ref 26.0–34.0)
MCHC: 31.2 g/dL (ref 30.0–36.0)
MCV: 88.9 fL (ref 78.0–100.0)
Platelets: 639 10*3/uL — ABNORMAL HIGH (ref 150–400)
RBC: 3.71 MIL/uL — ABNORMAL LOW (ref 4.22–5.81)
RDW: 16.6 % — ABNORMAL HIGH (ref 11.5–15.5)
WBC: 14.5 10*3/uL — ABNORMAL HIGH (ref 4.0–10.5)

## 2017-03-03 LAB — RESPIRATORY PANEL BY PCR

## 2017-03-03 LAB — PHOSPHORUS: Phosphorus: 3.7 mg/dL (ref 2.5–4.6)

## 2017-03-03 LAB — MAGNESIUM: Magnesium: 1.9 mg/dL (ref 1.7–2.4)

## 2017-03-03 LAB — MRSA PCR SCREENING: MRSA by PCR: POSITIVE — AB

## 2017-03-03 LAB — TSH: TSH: 1.835 u[IU]/mL (ref 0.350–4.500)

## 2017-03-03 MED ORDER — CLOPIDOGREL BISULFATE 75 MG PO TABS
75.0000 mg | ORAL_TABLET | Freq: Every day | ORAL | Status: DC
  Administered 2017-03-03 – 2017-03-07 (×5): 75 mg
  Filled 2017-03-03 (×5): qty 1

## 2017-03-03 MED ORDER — VANCOMYCIN HCL IN DEXTROSE 1-5 GM/200ML-% IV SOLN
1000.0000 mg | INTRAVENOUS | Status: DC
  Administered 2017-03-03: 1000 mg via INTRAVENOUS
  Filled 2017-03-03 (×2): qty 200

## 2017-03-03 MED ORDER — JEVITY 1.2 CAL PO LIQD
1000.0000 mL | ORAL | Status: DC
  Administered 2017-03-03 – 2017-03-05 (×3): 1000 mL
  Filled 2017-03-03 (×9): qty 1000

## 2017-03-03 MED ORDER — MUPIROCIN 2 % EX OINT
1.0000 "application " | TOPICAL_OINTMENT | Freq: Two times a day (BID) | CUTANEOUS | Status: DC
  Administered 2017-03-03 – 2017-03-07 (×9): 1 via NASAL
  Filled 2017-03-03: qty 22

## 2017-03-03 MED ORDER — CLONIDINE HCL 0.1 MG PO TABS
0.1000 mg | ORAL_TABLET | Freq: Three times a day (TID) | ORAL | Status: DC
  Administered 2017-03-03 – 2017-03-07 (×13): 0.1 mg
  Filled 2017-03-03 (×13): qty 1

## 2017-03-03 MED ORDER — THIAMINE HCL 100 MG/ML IJ SOLN
100.0000 mg | Freq: Every day | INTRAMUSCULAR | Status: DC
  Administered 2017-03-03: 100 mg via INTRAVENOUS
  Filled 2017-03-03: qty 2

## 2017-03-03 MED ORDER — FAMOTIDINE 20 MG PO TABS
40.0000 mg | ORAL_TABLET | Freq: Every day | ORAL | Status: DC
  Administered 2017-03-03 – 2017-03-07 (×5): 40 mg
  Filled 2017-03-03 (×5): qty 2

## 2017-03-03 MED ORDER — VITAMIN B-1 100 MG PO TABS
100.0000 mg | ORAL_TABLET | Freq: Every day | ORAL | Status: DC
  Administered 2017-03-03 – 2017-03-07 (×5): 100 mg
  Filled 2017-03-03 (×5): qty 1

## 2017-03-03 MED ORDER — METOPROLOL TARTRATE 25 MG PO TABS
25.0000 mg | ORAL_TABLET | Freq: Two times a day (BID) | ORAL | Status: DC
  Administered 2017-03-03 – 2017-03-07 (×8): 25 mg
  Filled 2017-03-03 (×9): qty 1

## 2017-03-03 MED ORDER — ASPIRIN 325 MG PO TABS
325.0000 mg | ORAL_TABLET | Freq: Every day | ORAL | Status: DC
  Administered 2017-03-03 – 2017-03-07 (×5): 325 mg
  Filled 2017-03-03 (×5): qty 1

## 2017-03-03 MED ORDER — FREE WATER
100.0000 mL | Status: DC
  Administered 2017-03-03 – 2017-03-07 (×34): 100 mL

## 2017-03-03 MED ORDER — IPRATROPIUM-ALBUTEROL 0.5-2.5 (3) MG/3ML IN SOLN: 3.0000 mL | Freq: Four times a day (QID) | RESPIRATORY_TRACT | Status: DC

## 2017-03-03 MED ORDER — CHLORHEXIDINE GLUCONATE CLOTH 2 % EX PADS
6.0000 | MEDICATED_PAD | Freq: Every day | CUTANEOUS | Status: AC
  Administered 2017-03-03 – 2017-03-07 (×5): 6 via TOPICAL

## 2017-03-03 MED ORDER — VANCOMYCIN HCL 10 G IV SOLR
1250.0000 mg | INTRAVENOUS | Status: DC
  Filled 2017-03-03: qty 1250

## 2017-03-03 MED ORDER — IPRATROPIUM-ALBUTEROL 0.5-2.5 (3) MG/3ML IN SOLN: 3.0000 mL | Freq: Four times a day (QID) | RESPIRATORY_TRACT | Status: DC | PRN

## 2017-03-03 NOTE — Progress Notes (Signed)
Pt was lying in bed when I arrived. His sister was bedside. Pt did not engage during our visit; he did not speak and only slightly raised his hand when I waved to him. He made eye contact. Pt's sister said she did not want to come to this hospital. She said she had had a bad experience here. She said her cousin, who was like a sister, passed away here. She said she did not feel she was taken care of. She said her brother was at [facility] on 37 yesterday from 2 until 11pm. She said there was nothing wrong with him. She said when he came here, they said all of these things are wrong with him. She said "it's like they are trying to kill him". I assured her we would not try to do that. She said she left at 2am and came back around 7:30am to find him in a freezing cold room with fluid on the front. She was upset about that. I gave listening support and sense part of her concern may be a result of the recent loss at this hospital and finding it difficult to separate the memories of that loss to now seeing her brother ill and in ICU. As appropriate I encouraged pt's sister [name]) and offered assurance in the positive care for her brother.  Pt's sister said she needed prayer. She was tearful after prayer and said she felt much better. During our visit I also referred her to pt advocacy if she needs and encouraged her to have the nurse page me if she needs additional assistance from spiritual care.  [place], MDiv.   03/03/17 0900  Clinical Encounter Type  Visited With Patient and family together

## 2017-03-03 NOTE — Progress Notes (Signed)
Pharmacy Antibiotic Note  Scott Rivera is a 66 y.o. male with feeding tube problem admitted on 03/02/2017 with pneumonia.  Pharmacy has been consulted for zosyn and vancomycin dosing.  Plan: Zosyn 3.375g IV q8h (4 hour infusion).  Vancomycin 1 Gm x1 then 1250 mg IV q24h for est AUC=495 Goal AUC = 400-500 Daily Scr F/u cultures/levels     Temp (24hrs), Avg:98.7 F (37.1 C), Min:97.8 F (36.6 C), Max:99.2 F (37.3 C)  Recent Labs  Lab 03/02/17 1643 03/02/17 1940  WBC 13.7*  --   CREATININE 0.95  --   LATICACIDVEN  --  0.41*    Estimated Creatinine Clearance: 68.7 mL/min (by C-G formula based on SCr of 0.95 mg/dL).    No Known Allergies  Antimicrobials this admission: 11/16 zosyn >>  11/16 vancomycin >>   Dose adjustments this admission:   Microbiology results:  BCx:   UCx:    Sputum:    MRSA PCR:   Thank you for allowing pharmacy to be a part of this patient's care.  Sammy, Cassar 03/03/2017 12:13 AM

## 2017-03-03 NOTE — Progress Notes (Signed)
Pharmacy Antibiotic Note  Scott Rivera is a 66 y.o. male with feeding tube problem admitted on 03/02/2017 with pneumonia.  Pharmacy has been consulted for zosyn and vancomycin dosing.  Plan: Zosyn 3.375g IV q8h (4 hour infusion).  Change vancomycin from 1250 mg IV q24h to 1g q24 using newing SCr of 1.08 for est AUC 450 Goal AUC = 400-500 Daily Scr F/u cultures/levels  Height: 5\' 6"  (167.6 cm) Weight: 138 lb 7.2 oz (62.8 kg) IBW/kg (Calculated) : 63.8  Temp (24hrs), Avg:98.5 F (36.9 C), Min:97.8 F (36.6 C), Max:99.2 F (37.3 C)  Recent Labs  Lab 03/02/17 1643 03/02/17 1940 03/03/17 0337  WBC 13.7*  --  14.5*  CREATININE 0.95  --  1.08  LATICACIDVEN  --  0.41*  --     Estimated Creatinine Clearance: 59.8 mL/min (by C-G formula based on SCr of 1.08 mg/dL).    No Known Allergies  Thank you for allowing pharmacy to be a part of this patient's care.  Adrian Saran, PharmD, BCPS Pager [phone] 03/03/2017 10:45 AM

## 2017-03-03 NOTE — Progress Notes (Signed)
Assumed care of patient at. Agree with previous RN assessment. Pt on continuous pulse ox and telemetry, and continuous TF.  Will continue to monitor closely.

## 2017-03-03 NOTE — Progress Notes (Signed)
PROGRESS NOTE    MARCELLA DUNNAWAY  KXF:[id number] DOB: Feb 22, 1951 DOA: 03/02/2017 PCP: Patient, No Pcp Per   Brief Narrative:  TRASHAUN STREIGHT is Colandra Ohanian 66 y.o. 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 On day of presentation he presented to [facility] with clogged feeding tube. Multiple attempts were made to declog it by ER  staff. The plan was for patient to have tube exchange by IR in [place] when he was noted to be hypoxic. Patient was placed on 40% FiO2 using tracheostomy copious secretions was noted requiring repeated suctioning. Patient nonverbal unable to provide his own history but per family no recent fevers or chills. Patient has history of recurrent aspiration pneumonia. He was started on IV antibiotics treat possibly recurrent aspiration and transferred to [facility] long step down for further management  Regarding pertinent Chronic problems: CVA in 10/2016  now status post tracheostomy and PEG tube has been out of rehabilitation for the past 2 weeks  Of note patient was started on Levaquin on November 6 for presumed aspiration pneumonia based on abnormal lung sounds  Assessment & Plan:   Active Problems:   Aspiration pneumonia (HCC)   Tracheostomy status ([place])   Stage III pressure ulcer of sacral region ([place])   Loculated pleural effusion   Acute respiratory failure with hypoxia (HCC)   Gastrostomy tube obstruction (HCC)   PEG tube malfunction (HCC)  Acute respiratory failure with hypoxia ([place]) - Suspected due to aspiration vs increased secretions.   - receiving vanc/zosyn for presumed aspiration pneumonia (will d/c vancomycin today) -  RVP, influenza, sputum cx blood cx - at previous d/c was on FiO2 0.28, not clear if he had been weaned off of this since discharge, will need to discuss with sister  Stage III pressure ulcer of sacral region ([place]) - resolving, continue to monitor  Loculated pleural effusion - chronic, mild currently afebrile, unlikely to be contributing to hypoxia.  Per CT, loculated effusion in L upper lobe and small LLL effusion similar to prior CT.  At previous hospitalization it looks like they decided against drainage.  If fevers or worsening effusion, would reconsult PCCM and c/s IR for pigtail catheter drainage.   Leukocytosis: Slightly increased from yesterday.  Continue to monitor.   HX of CVA - resume home medications when new functioning PEG is in place.   PEG tube malfunction - IR consult has been ordered, IR is aware of the patient.  Apparently in last hospitalization, there were multiple episodes where the tube waws clogged.  The freq of tube flushes was increased to 100 ml q3 and PEG tube brush was provided at discharge.     HTN: resume home clonidine, metoprolol.  Holding amlodipine for now.  DVT prophylaxis: SCD's Code Status: full  Family Communication: none at bedside Disposition Plan: pending improvement   Consultants:   Interventional Radiolog6  Procedures: (Don't include imaging studies which can be auto populated. Include things that cannot be auto populated i.e. Echo, Carotid and venous dopplers, Foley, Bipap, HD, tubes/drains, wound vac, central lines etc)  none  Antimicrobials: (specify start and planned stop date. Auto populated tables are space occupying and do not give end dates) Anti-infectives (From admission, onward)  Start     Dose/Rate Route Frequency Ordered Stop   03/03/17 1000  vancomycin (VANCOCIN) 1,250 mg in sodium chloride 0.9 % 250 mL IVPB     1,250 mg 166.7 mL/hr over 90 Minutes Intravenous Every 24 hours 03/03/17 0017     03/03/17 0200   piperacillin-tazobactam (ZOSYN) IVPB 3.375 g     3.375 g 12.5 mL/hr over 240 Minutes Intravenous Every 8 hours 03/02/17 2359     03/02/17 1900  vancomycin (VANCOCIN) IVPB 1000 mg/200 mL premix     1,000 mg 200 mL/hr over 60 Minutes Intravenous  Once 03/02/17 1848 03/02/17 2147   03/02/17 1900  piperacillin-tazobactam (ZOSYN) IVPB 3.375 g     3.375 g 100 mL/hr over 30 Minutes Intravenous  Once 03/02/17 1848 03/02/17 2040         Subjective: Nonverbal, but shakes head no that he's having pain or discomfort.  Objective: Vitals:   03/03/17 0217 03/03/17 0228 03/03/17 0400 03/03/17 0600  BP: (!) 165/87  (!) 172/76 140/65  Pulse: 97  88 87  Resp: (!) 21  19 15   Temp:   98 F (36.7 C)   TempSrc:   Oral   SpO2: 99% 99% 93% 97%  Weight:      Height:        Intake/Output Summary (Last 24 hours) at 03/03/2017 0756 Last data filed at 03/03/2017 0700 Gross per 24 hour  Intake 1116.67 ml  Output 1000 ml  Net 116.67 ml   Filed Weights   03/03/17 0000  Weight: 62.8 kg (138 lb 7.2 oz)    Examination:  General exam: Appears calm and comfortable  Respiratory system: Transmitted upper airway sounds throughout.  Copious secretions from tracheostomy.   Cardiovascular system: Mild tachy to low 100's.  S1 & S2 heard, RRR. No JVD, murmurs, rubs, gallops or clicks. No pedal edema. Gastrointestinal system: Abdomen is nondistended, soft and nontender. No organomegaly or masses felt. Normal bowel sounds heard.  Gtube present.  Central nervous system: Alert, unable to test orientation.  Moving upper extremities.  Limited movement of lower extremities.  Extremities: Bilateral boots in place, SCD's, no LEE.  Palpable pulses.  Skin: No rashes, lesions or ulcers visualized.     Data Reviewed: I have personally reviewed following labs and imaging studies  CBC: Recent Labs  Lab 03/02/17 1643 03/03/17 0337  WBC 13.7* 14.5*  NEUTROABS 10.0*  --   HGB 10.0* 10.3*  HCT 31.5* 33.0*  MCV 88.0  88.9  PLT 673* 790*   Basic Metabolic Panel: Recent Labs  Lab 03/02/17 1643 03/03/17 0337  NA 139 141  K 4.8 4.1  CL 105 107  CO2 27 24  GLUCOSE 87 80  BUN 34* 25*  CREATININE 0.95 1.08  CALCIUM 10.2 10.0  MG  --  1.9  PHOS  --  3.7   GFR: Estimated Creatinine Clearance: 59.8 mL/min (by C-G formula based on SCr of 1.08 mg/dL). Liver Function Tests: Recent Labs  Lab 03/02/17 1643 03/03/17 0337  AST 21 17  ALT 28 18  ALKPHOS 75 76  BILITOT 0.3 0.6  PROT 8.3* 8.1  ALBUMIN 2.8* 2.7*   No results for input(s): LIPASE, AMYLASE in the last 168 hours. No results for input(s): AMMONIA in the last 168 hours. Coagulation Profile: No results for input(s): INR, PROTIME in the last 168 hours. Cardiac Enzymes: Recent Labs  Lab 03/02/17 1952  TROPONINI <0.03   BNP (last 3 results) No results for input(s): PROBNP in the last  8760 hours. HbA1C: No results for input(s): HGBA1C in the last 72 hours. CBG: No results for input(s): GLUCAP in the last 168 hours. Lipid Profile: No results for input(s): CHOL, HDL, LDLCALC, TRIG, CHOLHDL, LDLDIRECT in the last 72 hours. Thyroid Function Tests: Recent Labs    03/03/17 0337  TSH 1.835   Anemia Panel: No results for input(s): VITAMINB12, FOLATE, FERRITIN, TIBC, IRON, RETICCTPCT in the last 72 hours. Sepsis Labs: Recent Labs  Lab 03/02/17 1940  LATICACIDVEN 0.41*    Recent Results (from the past 240 hour(s))  Culture, respiratory (NON-Expectorated)     Status: None (Preliminary result)   Collection Time: 03/02/17  8:46 PM  Result Value Ref Range Status   Specimen Description TRACHEAL ASPIRATE  Final   Special Requests NONE  Final   Gram Stain   Final    MODERATE WBC PRESENT, PREDOMINANTLY PMN FEW SQUAMOUS EPITHELIAL CELLS PRESENT RARE GRAM POSITIVE RODS Performed at [facility], [address], [place], [place] [address]    Culture PENDING  Incomplete   Report Status PENDING  Incomplete  MRSA PCR Screening      Status: Abnormal   Collection Time: 03/02/17 11:17 PM  Result Value Ref Range Status   MRSA by PCR POSITIVE (Yadier Bramhall) NEGATIVE Final    Comment:        The GeneXpert MRSA Assay (FDA approved for NASAL specimens only), is one component of [facility] comprehensive MRSA colonization surveillance program. It is not intended to diagnose MRSA infection nor to guide or monitor treatment for MRSA infections. RESULT CALLED TO, READ BACK BY AND VERIFIED WITH: G [facility] @11 /17/18 [facility]          Radiology Studies: Ct Angio Chest Pe W And/or Wo Contrast  Addendum Date: 03/02/2017   ADDENDUM REPORT: 03/02/2017 21:38 ADDENDUM: There is occluded appearance of the visualized origin of the left vertebral artery, age indeterminate, possibly chronic. If there is clinical concern for acute occlusion or acute infarct further evaluation with CT angiography of the head and neck recommended. Electronically Signed   By: Anner Crete M.D.   On: 03/02/2017 21:38   Result Date: 03/02/2017 CLINICAL DATA:  66 year old male with tracheostomy fluid and concern for fracture of tracheostomy. Patient presenting with respiratory findings concerning for PE. EXAM: CT ANGIOGRAPHY CHEST WITH CONTRAST TECHNIQUE: Multidetector CT imaging of the chest was performed using the standard protocol during bolus administration of intravenous contrast. Multiplanar CT image reconstructions and MIPs were obtained to evaluate the vascular anatomy. CONTRAST:  143mL ISOVUE-370 IOPAMIDOL (ISOVUE-370) INJECTION 76% COMPARISON:  Chest radiograph dated 03/02/2017 FINDINGS: Cardiovascular: There is no cardiomegaly or pericardial effusion. Coronary vascular calcification primarily involving the LAD. Focal area of coarse calcification in the anterior pericardium similar to prior CT. There is moderate atherosclerotic calcification of the thoracic aorta. There is atherosclerotic calcification of the origin of the left common carotid artery. The visualized  proximal portion of the left vertebral artery appears occluded, age indeterminate, likely chronic. If there is clinical concern for acute conclusion or acute infarct further evaluation with CT angiography of the head and neck recommended. Evaluation of the pulmonary arteries is somewhat limited due to respiratory motion artifact and suboptimal visualization of the peripheral branches. No CT evidence of pulmonary artery embolus in the visualized segment of the vasculature. Mediastinum/Nodes: There is no hilar or mediastinal adenopathy. The esophagus is grossly unremarkable. The left thyroid gland is not visualized, likely surgically absent. Multiple mildly enlarged supraclavicular lymph nodes noted bilaterally measuring up to 12  mm on the left. Clinical correlation is recommended. Lungs/Pleura: There is Nysha Koplin background of mild emphysema. Bibasilar linear atelectasis/scarring noted. There is Allecia Bells small loculated appearing pleural effusion in the left upper lobe posteriorly extending to the lingula. Odel Schmid small pleural effusion is seen in the left lower lobe similar to prior CT. Overall there has been improved aeration of the left lung base compared to the prior CT. There is no pneumothorax. Mucus secretion material noted in the trachea and bilateral mainstem bronchi. Kjersti Dittmer tracheostomy is noted with tip above the carina. Upper Abdomen: An ill-defined 12 mm low attenuating focus in the anterior right lobe of the liver is not well characterized. Evaluation of the upper abdomen is limited due to streak artifact caused by patient's arms. Musculoskeletal: There is degenerative changes of the spine. 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 No acute osseous pathology. Review of the MIP images confirms the above findings. IMPRESSION: 1. No CT evidence of central pulmonary artery embolus. 2. Loculated pleural effusion in the left upper lobe as well as Kimberley Speece small left lower lobe pleural effusion similar to prior CT. Overall  improvement of the aeration of the left lung base compared to the prior CT. 3. Tracheostomy with tip above the carina. There is mucus secretion content within the central airways. 4. Aortic Atherosclerosis (ICD10-I70.0) and Emphysema (ICD10-J43.9). 5. Mildly enlarged bilateral supraclavicular lymph nodes. Clinical correlation is recommended. Electronically Signed: By: Anner Crete M.D. On: 03/02/2017 21:15   Dg Chest Portable 1 View  Result Date: 03/02/2017 CLINICAL DATA:  Cough and congestion. History of pleural effusion. Tracheostomy. EXAM: PORTABLE CHEST 1 VIEW COMPARISON:  Chest x-rays dated 02/24/2017 and 01/07/2017. FINDINGS: Heart size and mediastinal contours are stable. Tracheostomy tube appears appropriately positioned in the midline. Interstitial markings are more prominent on today's study suggesting interstitial edema. Persistent opacity along the left lateral chest wall, compatible with the loculated appearing effusion demonstrated on chest CT of 12/29/2016. IMPRESSION: 1. Probable bilateral interstitial edema suggesting mild CHF/volume overload. 2. Persistent opacity along the left lateral chest wall, similar to earlier chest x-rays of 01/07/2017 and 12/31/2016, compatible with the loculated appearing pleural effusion demonstrated on chest CT of 12/29/2016. 3. Tracheostomy tube appears appropriately positioned in the midline. Electronically Signed   By: Franki Cabot M.D.   On: 03/02/2017 17:56        Scheduled Meds: . Chlorhexidine Gluconate Cloth  6 each Topical Q0600  . mouth rinse  15 mL Mouth Rinse BID  . mupirocin ointment  1 application Nasal BID  . thiamine injection  100 mg Intravenous Daily   Continuous Infusions: . sodium chloride 100 mL (03/03/17 0220)  . piperacillin-tazobactam 3.375 g (03/03/17 0236)  . vancomycin       LOS: 1 day    Time spent: over 30 minutes    Fayrene Helper, MD [facility] Pager [phone]  If 7PM-7AM, please contact  night-coverage [email or web address] Password [email or web address] 03/03/2017, 7:56 AM

## 2017-03-03 NOTE — Procedures (Signed)
Evaluated clogged g tube at patients bedside.  The patient has a 20 fr pull thru g tube that family states has been getting repeatedly clogged over the last 2 weeks.  There was residual feedings in the tube and I was unable to initial flush the catheter.  I shortened the catheter approximately 2 inches.  With the hub off, I was successfully able to remove the residual clogged feeding with an enteral feeding tube declogger.  I replaced the hub and the tube flushed with ease.  I advised the family to move the clamp on the tube to various locations so the tube does not continue to be clamp at the same spot. Also to flush more frequently if necessary.  I gave her the IR card for her to call if she has any further problems with the catheter as an outpatient.  Rn was informed the g tube was ready to use.

## 2017-03-04 ENCOUNTER — Other Ambulatory Visit: Payer: Self-pay

## 2017-03-04 ENCOUNTER — Encounter (HOSPITAL_COMMUNITY): Payer: Self-pay

## 2017-03-04 LAB — BASIC METABOLIC PANEL
Anion gap: 6 (ref 5–15)
BUN: 17 mg/dL (ref 6–20)
CO2: 25 mmol/L (ref 22–32)
Calcium: 9.4 mg/dL (ref 8.9–10.3)
Chloride: 109 mmol/L (ref 101–111)
Creatinine, Ser: 0.87 mg/dL (ref 0.61–1.24)
GFR calc Af Amer: >60 mL/min (ref >60)
GFR calc non Af Amer: >60 mL/min (ref >60)
Glucose, Bld: 112 mg/dL — ABNORMAL HIGH (ref 65–99)
Potassium: 4 mmol/L (ref 3.5–5.1)
Sodium: 140 mmol/L (ref 135–145)

## 2017-03-04 LAB — CBC
HCT: 27.5 % — ABNORMAL LOW (ref 39.0–52.0)
Hemoglobin: 8.5 g/dL — ABNORMAL LOW (ref 13.0–17.0)
MCH: 27.4 pg (ref 26.0–34.0)
MCHC: 30.9 g/dL (ref 30.0–36.0)
MCV: 88.7 fL (ref 78.0–100.0)
Platelets: 509 10*3/uL — ABNORMAL HIGH (ref 150–400)
RBC: 3.1 MIL/uL — ABNORMAL LOW (ref 4.22–5.81)
RDW: 16.7 % — ABNORMAL HIGH (ref 11.5–15.5)
WBC: 15.1 10*3/uL — ABNORMAL HIGH (ref 4.0–10.5)

## 2017-03-04 LAB — HEMOGLOBIN AND HEMATOCRIT, BLOOD
HCT: 27.2 % — ABNORMAL LOW (ref 39.0–52.0)
Hemoglobin: 8.4 g/dL — ABNORMAL LOW (ref 13.0–17.0)

## 2017-03-04 MED ORDER — ALBUTEROL SULFATE (2.5 MG/3ML) 0.083% IN NEBU: 2.5000 mg | INHALATION_SOLUTION | RESPIRATORY_TRACT | Status: DC | PRN

## 2017-03-04 NOTE — Progress Notes (Addendum)
OT note  Pt is nonverbal and +3 for turning per nursing.  Pt nonambulatory per admission notes.  No family present to state baseline however pt appears likely bedbound.  [place], [place]

## 2017-03-04 NOTE — Progress Notes (Signed)
Patient has had an uneventful day. Patient trach has been suctioned every 1-2 hours. Patient will continue to be monitored.

## 2017-03-04 NOTE — Progress Notes (Signed)
Initial Nutrition Assessment  DOCUMENTATION CODES:   Severe malnutrition in context of chronic illness  INTERVENTION:   If MD would like RD to manage TF, please consult for TF management.  Tube feeding recommendations: Continue Jevity 1.2 @ 60 ml/hr. Provide 30 ml Prostat daily per tube. This will provide 1828 kcal, 94g protein and 1162 ml H2O.  RD will continue to monitor  NUTRITION DIAGNOSIS:   Severe Malnutrition related to chronic illness(respiratory failure with trach, stroke, aspiration PNA) as evidenced by percent weight loss, severe fat depletion, severe muscle depletion.  GOAL:   Patient will meet greater than or equal to 90% of their needs  MONITOR:   Labs, Weight trends, TF tolerance, Skin, I & O's  REASON FOR ASSESSMENT:   Consult Assessment of nutrition requirement/status  ASSESSMENT:   66 y.o. 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 Pt with trach. PEG was clogged when admitted but was fixed by IR 11/17. Currently patient is receiving Jevity 1.2 @ 60 ml/hr (providing 1728 kcal and 79g protein). To better meets needs, would add 30 ml Prostat daily. Pt is NPO.  Per chart review, pt has lost 19 lb since 8/15 (12% wt loss x 3 months, significant for time frame).   Labs reviewed. Medications: Thiamine tablet daily, Free water of 100 ml every 3 hours (800 ml total).  NUTRITION - FOCUSED PHYSICAL EXAM:    Most Recent Value  Orbital Region  No depletion  Upper Arm Region  Severe depletion  Thoracic and Lumbar Region  Unable to assess  Buccal Region  Unable to assess  Temple Region  Severe depletion  Clavicle Bone Region  Severe depletion  Clavicle  and Acromion Bone Region  Severe depletion  Scapular Bone Region  Unable to assess  Dorsal Hand  Severe depletion  Patellar Region  Unable to assess  Anterior Thigh Region  Unable to assess  Posterior Calf Region  Unable to assess  Edema (RD Assessment)  None       Diet Order:  Diet NPO time specified  EDUCATION NEEDS:   No education needs have been identified at this time  Skin:  Skin Assessment: Skin Integrity Issues: Skin Integrity Issues:: Stage I, Stage II Stage I: coccyx Stage II: sacrum  Last BM:  11/17  Height:   Ht Readings from Last 1 Encounters:  03/03/17 5\' 6"  (1.676 m)    Weight:   Wt Readings from Last 1 Encounters:  03/03/17 138 lb 7.2 oz (62.8 kg)    Ideal Body Weight:  64.5 kg  BMI:  Body mass index is 22.35 kg/m.  Estimated Nutritional Needs:   Kcal:  3532-9924  Protein:  80-90g  Fluid:  1.9L/day  Clayton Bibles, MS, RD, LDN [place] [facility] Pager: [phone] After Hours Pager: [phone]

## 2017-03-04 NOTE — Progress Notes (Signed)
PT Cancellation Note  Patient Details Name: Scott Rivera MRN: [id number] DOB: 03-08-1951   Cancelled Treatment:    Reason Eval/Treat Not Completed: Other (comment) Pt is nonverbal and +3 for turning per nursing.  Pt nonambulatory per admission notes.  No family present to state baseline however pt appears likely bedbound.  Naketa Daddario,KATHrine E 03/04/2017, 10:51 AM Carmelia Bake, PT, DPT 03/04/2017 Pager: [phone]

## 2017-03-04 NOTE — Progress Notes (Signed)
PROGRESS NOTE    Scott Rivera  JSE:[id number] DOB: [birth year] DOA: 03/02/2017 PCP: Patient, No Pcp Per   Brief Narrative:  Scott Rivera is [facility] Rivera 66 y.o. 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 On day of presentation he presented to [facility] with clogged feeding tube. Multiple attempts were made to declog it by ER  staff. The plan was for patient to have tube exchange by IR in [facility] when he was noted to be hypoxic. Patient was placed on 40% FiO2 using tracheostomy copious secretions was noted requiring repeated suctioning. Patient nonverbal unable to provide his own history but per family no recent fevers or chills. Patient has history of recurrent aspiration pneumonia. He was started on IV antibiotics treat possibly recurrent aspiration and transferred to [facility] long step down for further management  Regarding pertinent Chronic problems: CVA in 10/2016  now status post tracheostomy and PEG tube has been out of rehabilitation for the past 2 weeks  Of note patient was started on Levaquin on November 6 for presumed aspiration pneumonia based on abnormal lung sounds  Assessment & Plan:   Active Problems:   Aspiration pneumonia ([facility])   Tracheostomy status ([facility])   Stage III pressure ulcer of sacral region ([facility])   Loculated pleural effusion   Acute respiratory failure with hypoxia ([facility])   Gastrostomy tube obstruction ([facility])   PEG tube malfunction ([facility])  Acute respiratory failure with hypoxia ([facility]) - Suspected due to aspiration vs increased secretions.   - receiving vanc/zosyn for presumed aspiration pneumonia, narrowed to zosyn - RVP  negative, influenza negative [ ]  sputum cx pending  [ ]  blood cx pending - at previous d/c was on FiO2 0.28, not clear if he had been weaned off of this since discharge, will need to discuss with sister  Stage III pressure ulcer of sacral region ([facility]) - resolving, continue to monitor  Loculated pleural effusion - chronic, mild currently afebrile, unlikely to be contributing to hypoxia.  Per CT, loculated effusion in L upper lobe and small LLL effusion similar to prior CT.  At previous hospitalization it looks like they decided against drainage.  If fevers or worsening effusion, would reconsult PCCM and c/s IR for pigtail catheter drainage.   Leukocytosis: Slightly increased from yesterday.  Continue to monitor.   HX of CVA - resume home medications when new functioning PEG is in place.   PEG tube malfunction - IR consult has been ordered, IR is aware of the patient.  Apparently in last hospitalization, there were multiple episodes where the tube waws clogged.  The freq of tube flushes was increased to 100 ml q3 and PEG tube brush was provided at discharge.     HTN: resume home clonidine, metoprolol.  Holding amlodipine for now.  Anemia: H/H decreased, unclear reason.  No reported blood in stool or emesis. will repeat this afternoon.    DVT prophylaxis: SCD's Code Status: full  Family Communication: none at bedside Disposition Plan: pending improvement   Consultants:   Interventional [email or web address]  Procedures: (Don't include imaging studies which can be auto populated. Include things that cannot be auto populated i.e. Echo, Carotid and venous dopplers, Foley, Bipap, HD, tubes/drains, wound vac, central lines  etc)  none  Antimicrobials: (specify start and planned stop date. Auto populated tables are space occupying and do not give end dates) Anti-infectives (From admission, onward)   Start     Dose/Rate Route Frequency Ordered Stop   03/03/17 1200  vancomycin (VANCOCIN) IVPB 1000  mg/200 mL premix  Status:  Discontinued     1,000 mg 200 mL/hr over 60 Minutes Intravenous Every 24 hours 03/03/17 1046 03/03/17 1745   03/03/17 1000  vancomycin (VANCOCIN) 1,250 mg in sodium chloride 0.9 % 250 mL IVPB  Status:  Discontinued     1,250 mg 166.7 mL/hr over 90 Minutes Intravenous Every 24 hours 03/03/17 0017 03/03/17 1046   03/03/17 0200  piperacillin-tazobactam (ZOSYN) IVPB 3.375 g     3.375 g 12.5 mL/hr over 240 Minutes Intravenous Every 8 hours 03/02/17 2359     03/02/17 1900  vancomycin (VANCOCIN) IVPB 1000 mg/200 mL premix     1,000 mg 200 mL/hr over 60 Minutes Intravenous  Once 03/02/17 1848 03/02/17 2147   03/02/17 1900  piperacillin-tazobactam (ZOSYN) IVPB 3.375 g     3.375 g 100 mL/hr over 30 Minutes Intravenous  Once 03/02/17 1848 03/02/17 2040         Subjective: Nonverbal.  Objective: Vitals:   03/04/17 0612 03/04/17 0810 03/04/17 0815 03/04/17 1147  BP: (!) 152/65     Pulse: 84  72 72  Resp: (!) 21  20 20   Temp: 98.5 F (36.9 C)     TempSrc: Oral     SpO2: 97% 100% 100% 98%  Weight:      Height:        Intake/Output Summary (Last 24 hours) at 03/04/2017 1350 Last data filed at 03/04/2017 0600 Gross per 24 hour  Intake 2914 ml  Output 450 ml  Net 2464 ml   Filed Weights   03/03/17 0000  Weight: 62.8 kg (138 lb 7.2 oz)    Examination:  General: No acute distress. Cardiovascular: Heart sounds show Ashwika Freels regular rate, and rhythm. No gallops or rubs. No murmurs. No JVD. Lungs: Clear to auscultation bilaterally with good air movement. No rales, rhonchi or wheezes. Abdomen: Soft, nontender, nondistended with normal active bowel sounds. No masses. No hepatosplenomegaly. Neurological:nonverbal.  Opens eyes and attends to voice.Moves upper extremities.   Skin: Warm and dry. No rashes or lesions. Extremities: No clubbing or cyanosis. No edema. Boots in place.  SCD's.     Data Reviewed: I have personally reviewed following labs and imaging  studies  CBC: Recent Labs  Lab 03/02/17 1643 03/03/17 0337 03/04/17 0504  WBC 13.7* 14.5* 15.1*  NEUTROABS 10.0*  --   --   HGB 10.0* 10.3* 8.5*  HCT 31.5* 33.0* 27.5*  MCV 88.0 88.9 88.7  PLT 673* 639* 409*   Basic Metabolic Panel: Recent Labs  Lab 03/02/17 1643 03/03/17 0337 03/04/17 0504  NA 139 141 140  K 4.8 4.1 4.0  CL 105 107 109  CO2 27 24 25   GLUCOSE 87 80 112*  BUN 34* 25* 17  CREATININE 0.95 1.08 0.87  CALCIUM 10.2 10.0 9.4  MG  --  1.9  --   PHOS  --  3.7  --    GFR: Estimated Creatinine Clearance: 74.2 mL/min (by C-G formula based on SCr of 0.87 mg/dL). Liver Function Tests: Recent Labs  Lab 03/02/17 1643 03/03/17 0337  AST 21 17  ALT 28 18  ALKPHOS 75 76  BILITOT 0.3 0.6  PROT 8.3* 8.1  ALBUMIN 2.8* 2.7*  No results for input(s): LIPASE, AMYLASE in the last 168 hours. No results for input(s): AMMONIA in the last 168 hours. Coagulation Profile: No results for input(s): INR, PROTIME in the last 168 hours. Cardiac Enzymes: Recent Labs  Lab 03/02/17 1952  TROPONINI <0.03   BNP (last 3 results) No results for input(s): PROBNP in the last 8760 hours. HbA1C: No results for input(s): HGBA1C in the last 72 hours. CBG: No results for input(s): GLUCAP in the last 168 hours. Lipid Profile: No results for input(s): CHOL, HDL, LDLCALC, TRIG, CHOLHDL, LDLDIRECT in the last 72 hours. Thyroid Function Tests: Recent Labs    03/03/17 0337  TSH 1.835   Anemia Panel: No results for input(s): VITAMINB12, FOLATE, FERRITIN, TIBC, IRON, RETICCTPCT in the last 72 hours. Sepsis Labs: Recent Labs  Lab 03/02/17 1940  LATICACIDVEN 0.41*    Recent Results (from the past 240 hour(s))  Blood culture (routine x 2)     Status: None (Preliminary result)   Collection Time: 03/02/17  7:48 PM  Result Value Ref Range Status   Specimen Description BLOOD BLOOD RIGHT ARM UPPER  Final   Special Requests   Final    BOTTLES DRAWN AEROBIC AND ANAEROBIC Blood  Culture adequate volume   Culture   Final    NO GROWTH 2 DAYS Performed at [facility], [address], [place], [place] [address]    Report Status PENDING  Incomplete  Culture, respiratory (NON-Expectorated)     Status: None (Preliminary result)   Collection Time: 03/02/17  8:46 PM  Result Value Ref Range Status   Specimen Description TRACHEAL ASPIRATE  Final   Special Requests NONE  Final   Gram Stain   Final    MODERATE WBC PRESENT, PREDOMINANTLY PMN FEW SQUAMOUS EPITHELIAL CELLS PRESENT RARE GRAM POSITIVE RODS    Culture   Final    CULTURE REINCUBATED FOR BETTER GROWTH Performed at [facility], [place] [address] [place], [place] [address]    Report Status PENDING  Incomplete  MRSA PCR Screening     Status: Abnormal   Collection Time: 03/02/17 11:17 PM  Result Value Ref Range Status   MRSA by PCR POSITIVE (Zhoey Blackstock) NEGATIVE Final    Comment:        The GeneXpert MRSA Assay (FDA approved for NASAL specimens only), is one component of Bentleigh Stankus comprehensive MRSA colonization surveillance program. It is not intended to diagnose MRSA infection nor to guide or monitor treatment for MRSA infections. RESULT CALLED TO, READ BACK BY AND VERIFIED WITH: G MBEMENA,RN @11 /17/18 MKELLY   Blood culture (routine x 2)     Status: None (Preliminary result)   Collection Time: 03/03/17  3:37 AM  Result Value Ref Range Status   Specimen Description LEFT ANTECUBITAL  Final   Special Requests IN PEDIATRIC BOTTLE Blood Culture adequate volume  Final   Culture   Final    NO GROWTH 1 DAY Performed at [facility], [place] [address], [place], [place] [address]    Report Status PENDING  Incomplete  Culture, respiratory (NON-Expectorated)     Status: None (Preliminary result)   Collection Time: 03/03/17  6:40 AM  Result Value Ref Range Status   Specimen Description TRACHEAL ASPIRATE  Final   Special Requests Immunocompromised  Final   Gram Stain   Final    ABUNDANT WBC PRESENT,  PREDOMINANTLY PMN FEW SQUAMOUS EPITHELIAL CELLS PRESENT FEW GRAM POSITIVE RODS RARE GRAM NEGATIVE RODS RARE GRAM POSITIVE COCCI IN PAIRS  Culture   Final    CULTURE REINCUBATED FOR BETTER GROWTH Performed at [facility], [place] [address], [place], [place] [address]    Report Status PENDING  Incomplete  Respiratory Panel by PCR     Status: None   Collection Time: 03/03/17  9:19 AM  Result Value Ref Range Status   Adenovirus NOT DETECTED NOT DETECTED Final   Coronavirus 229E NOT DETECTED NOT DETECTED Final   Coronavirus HKU1 NOT DETECTED NOT DETECTED Final   Coronavirus NL63 NOT DETECTED NOT DETECTED Final   Coronavirus OC43 NOT DETECTED NOT DETECTED Final   Metapneumovirus NOT DETECTED NOT DETECTED Final   Rhinovirus / Enterovirus NOT DETECTED NOT DETECTED Final   Influenza Benjy Kana NOT DETECTED NOT DETECTED Final   Influenza B NOT DETECTED NOT DETECTED Final   Parainfluenza Virus 1 NOT DETECTED NOT DETECTED Final   Parainfluenza Virus 2 NOT DETECTED NOT DETECTED Final   Parainfluenza Virus 3 NOT DETECTED NOT DETECTED Final   Parainfluenza Virus 4 NOT DETECTED NOT DETECTED Final   Respiratory Syncytial Virus NOT DETECTED NOT DETECTED Final   Bordetella pertussis NOT DETECTED NOT DETECTED Final   Chlamydophila pneumoniae NOT DETECTED NOT DETECTED Final   Mycoplasma pneumoniae NOT DETECTED NOT DETECTED Final         Radiology Studies: Ct Angio Chest Pe W And/or Wo Contrast  Addendum Date: 03/02/2017   ADDENDUM REPORT: 03/02/2017 21:38 ADDENDUM: There is occluded appearance of the visualized origin of the left vertebral artery, age indeterminate, possibly chronic. If there is clinical concern for acute occlusion or acute infarct further evaluation with CT angiography of the head and neck recommended. Electronically Signed   By: Anner Crete M.D.   On: 03/02/2017 21:38   Result Date: 03/02/2017 CLINICAL DATA:  66 year old male with tracheostomy fluid and concern for  fracture of tracheostomy. Patient presenting with respiratory findings concerning for PE. EXAM: CT ANGIOGRAPHY CHEST WITH CONTRAST TECHNIQUE: Multidetector CT imaging of the chest was performed using the standard protocol during bolus administration of intravenous contrast. Multiplanar CT image reconstructions and MIPs were obtained to evaluate the vascular anatomy. CONTRAST:  182mL ISOVUE-370 IOPAMIDOL (ISOVUE-370) INJECTION 76% COMPARISON:  Chest radiograph dated 03/02/2017 FINDINGS: Cardiovascular: There is no cardiomegaly or pericardial effusion. Coronary vascular calcification primarily involving the LAD. Focal area of coarse calcification in the anterior pericardium similar to prior CT. There is moderate atherosclerotic calcification of the thoracic aorta. There is atherosclerotic calcification of the origin of the left common carotid artery. The visualized proximal portion of the left vertebral artery appears occluded, age indeterminate, likely chronic. If there is clinical concern for acute conclusion or acute infarct further evaluation with CT angiography of the head and neck recommended. Evaluation of the pulmonary arteries is somewhat limited due to respiratory motion artifact and suboptimal visualization of the peripheral branches. No CT evidence of pulmonary artery embolus in the visualized segment of the vasculature. Mediastinum/Nodes: There is no hilar or mediastinal adenopathy. The esophagus is grossly unremarkable. The left thyroid gland is not visualized, likely surgically absent. Multiple mildly enlarged supraclavicular lymph nodes noted bilaterally measuring up to 12 mm on the left. Clinical correlation is recommended. Lungs/Pleura: There is Silvana Holecek background of mild emphysema. Bibasilar linear atelectasis/scarring noted. There is Andrewjames Weirauch small loculated appearing pleural effusion in the left upper lobe posteriorly extending to the lingula. Zannie Runkle small pleural effusion is seen in the left lower lobe similar to  prior CT. Overall there has been improved aeration of the left lung base compared  to the prior CT. There is no pneumothorax. Mucus secretion material noted in the trachea and bilateral mainstem bronchi. Adonijah Baena tracheostomy is noted with tip above the carina. Upper Abdomen: An ill-defined 12 mm low attenuating focus in the anterior right lobe of the liver is not well characterized. Evaluation of the upper abdomen is limited due to streak artifact caused by patient's arms. Musculoskeletal: There is degenerative changes of the spine. 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 No acute osseous pathology. Review of the MIP images confirms the above findings. IMPRESSION: 1. No CT evidence of central pulmonary artery embolus. 2. Loculated pleural effusion in the left upper lobe as well as Gabby Rackers small left lower lobe pleural effusion similar to prior CT. Overall improvement of the aeration of the left lung base compared to the prior CT. 3. Tracheostomy with tip above the carina. There is mucus secretion content within the central airways. 4. Aortic Atherosclerosis (ICD10-I70.0) and Emphysema (ICD10-J43.9). 5. Mildly enlarged bilateral supraclavicular lymph nodes. Clinical correlation is recommended. Electronically Signed: By: Anner Crete M.D. On: 03/02/2017 21:15   Dg Chest Portable 1 View  Result Date: 03/02/2017 CLINICAL DATA:  Cough and congestion. History of pleural effusion. Tracheostomy. EXAM: PORTABLE CHEST 1 VIEW COMPARISON:  Chest x-rays dated 02/24/2017 and 01/07/2017. FINDINGS: Heart size and mediastinal contours are stable. Tracheostomy tube appears appropriately positioned in the midline. Interstitial markings are more prominent on today's study suggesting interstitial edema. Persistent opacity along the left lateral chest wall, compatible with the loculated appearing effusion demonstrated on chest CT of 12/29/2016. IMPRESSION: 1. Probable bilateral interstitial edema suggesting mild CHF/volume  overload. 2. Persistent opacity along the left lateral chest wall, similar to earlier chest x-rays of 01/07/2017 and 12/31/2016, compatible with the loculated appearing pleural effusion demonstrated on chest CT of 12/29/2016. 3. Tracheostomy tube appears appropriately positioned in the midline. Electronically Signed   By: Franki Cabot M.D.   On: 03/02/2017 17:56   Ir Patient Eval Tech 0-60 Mins  Result Date: 03/03/2017 Chipper Oman     03/03/2017 10:08 AM Evaluated clogged g tube at patients bedside.  The patient has Prince Couey 20 fr pull thru g tube that family states has been getting repeatedly clogged over the last 2 weeks.  There was residual feedings in the tube and I was unable to initial flush the catheter.  I shortened the catheter approximately 2 inches.  With the hub off, I was successfully able to remove the residual clogged feeding with an enteral feeding tube declogger.  I replaced the hub and the tube flushed with ease.  I advised the family to move the clamp on the tube to various locations so the tube does not continue to be clamp at the same spot. Also to flush more frequently if necessary.  I gave her the IR card for her to call if she has any further problems with the catheter as an outpatient.  Rn was informed the g tube was ready to use.       Scheduled Meds: . aspirin  325 mg Per Tube Daily  . Chlorhexidine Gluconate Cloth  6 each Topical Q0600  . cloNIDine  0.1 mg Per Tube TID  . clopidogrel  75 mg Per Tube Daily  . famotidine  40 mg Per Tube Daily  . free water  100 mL Per Tube Q3H  . mouth rinse  15 mL Mouth Rinse BID  . metoprolol tartrate  25 mg Per Tube BID  . mupirocin ointment  1  application Nasal BID  . thiamine  100 mg Per Tube Daily   Continuous Infusions: . feeding supplement (JEVITY 1.2 CAL) 1,000 mL (03/04/17 1021)  . piperacillin-tazobactam 3.375 g (03/04/17 1006)     LOS: 2 days    Time spent: over 20 minutes    Fayrene Helper, MD [facility] Pager [phone]  If 7PM-7AM, please contact night-coverage [email or web address] Password [email or web address] 03/04/2017, 1:50 PM

## 2017-03-04 NOTE — Progress Notes (Signed)
Pt's sister, [name], was bedside when I arrived today. Pt was awake but did not communicate until I was ready to leave and he briefly waved. His sister was very attentive and said she was feeling better than yesterday. Pt's sister did not present any complaints today. She was thankful for the return visit. Please page if additional assistance is needed. [place], MDiv   03/04/17 1100  Clinical Encounter Type  Visited With Patient and family together

## 2017-03-05 DIAGNOSIS — Z1624 Resistance to multiple antibiotics: Secondary | ICD-10-CM

## 2017-03-05 DIAGNOSIS — A499 Bacterial infection, unspecified: Secondary | ICD-10-CM

## 2017-03-05 LAB — BASIC METABOLIC PANEL
Anion gap: 7 (ref 5–15)
BUN: 14 mg/dL (ref 6–20)
CO2: 26 mmol/L (ref 22–32)
Calcium: 9.8 mg/dL (ref 8.9–10.3)
Chloride: 105 mmol/L (ref 101–111)
Creatinine, Ser: 0.87 mg/dL (ref 0.61–1.24)
GFR calc Af Amer: >60 mL/min (ref >60)
GFR calc non Af Amer: >60 mL/min (ref >60)
Glucose, Bld: 116 mg/dL — ABNORMAL HIGH (ref 65–99)
Potassium: 3.8 mmol/L (ref 3.5–5.1)
Sodium: 138 mmol/L (ref 135–145)

## 2017-03-05 LAB — CBC
HCT: 28.5 % — ABNORMAL LOW (ref 39.0–52.0)
Hemoglobin: 8.8 g/dL — ABNORMAL LOW (ref 13.0–17.0)
MCH: 27.5 pg (ref 26.0–34.0)
MCHC: 30.9 g/dL (ref 30.0–36.0)
MCV: 89.1 fL (ref 78.0–100.0)
Platelets: 587 10*3/uL — ABNORMAL HIGH (ref 150–400)
RBC: 3.2 MIL/uL — ABNORMAL LOW (ref 4.22–5.81)
RDW: 16.3 % — ABNORMAL HIGH (ref 11.5–15.5)
WBC: 13.3 10*3/uL — ABNORMAL HIGH (ref 4.0–10.5)

## 2017-03-05 LAB — PROCALCITONIN: Procalcitonin: <0.1 ng/mL

## 2017-03-05 MED ORDER — MEROPENEM 1 G IV SOLR
2.0000 g | Freq: Three times a day (TID) | INTRAVENOUS | Status: DC
  Administered 2017-03-05 – 2017-03-06 (×3): 2 g via INTRAVENOUS
  Filled 2017-03-05 (×4): qty 2

## 2017-03-05 NOTE — Progress Notes (Signed)
Pharmacy Antibiotic Note  [facility] is a 66 y.o. male with hx of CVA s/p tracheostomy and recurrent aspiration pneumonia  admitted on 03/02/2017 with feeding tube problem and pneumonia.  Pharmacy has been consulted to change zosyn to carbapenem therapy to cover Acinetobacter.  Plan: Meropenem 2g IV q8h F/u renal function and adjust as needed  Height: 5\' 6"  (167.6 cm) Weight: 138 lb 7.2 oz (62.8 kg) IBW/kg (Calculated) : 63.8  Temp (24hrs), Avg:98.3 F (36.8 C), Min:98.2 F (36.8 C), Max:98.4 F (36.9 C)  Recent Labs  Lab 03/02/17 1643 03/02/17 1940 03/03/17 0337 03/04/17 0504 03/05/17 0452  WBC 13.7*  --  14.5* 15.1* 13.3*  CREATININE 0.95  --  1.08 0.87 0.87  LATICACIDVEN  --  0.41*  --   --   --     Estimated Creatinine Clearance: 74.2 mL/min (by C-G formula based on SCr of 0.87 mg/dL).    No Known Allergies  Antimicrobials this admission: 11/16 zosyn >> 11/19 11/16 vancomycin >> 11/17 11/19 meropenem >>  Dose adjustments this admission: 11/17: SCr rose - recalculated vanc dose using AUC calculator - changed dose from 1250mg  q24 to 1g q24 see above for est AUC  Microbiology results: 11/16 BCx x1: ngtd 11/16 TA: rare GPR, few acinetobacter calcoaceticus/baumannii complex 11/16 MRSA PCR: positive 11/17 TA: few GPR, GNR; rare GPC in pairs 11/17 flu PCR: negative 11/17 resp panel pcr: neg 11/17 BCx: ngtd  Thank you for allowing pharmacy to be a part of this patient's care.  Peggyann Juba, PharmD, BCPS Pager: [phone] 03/05/2017 3:25 PM

## 2017-03-05 NOTE — Progress Notes (Signed)
PROGRESS NOTE    Scott Rivera  NFA:[id number] DOB: 01/04/1951 DOA: 03/02/2017 PCP: Patient, No Pcp Per   Brief Narrative:  Scott Rivera is Scott Rivera 66 y.o. 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 On day of presentation he presented to [facility] with clogged feeding tube. Multiple attempts were made to declog it by ER  staff. The plan was for patient to have tube exchange by IR in Scott Rivera.m. when he was noted to be hypoxic. Patient was placed on 40% FiO2 using tracheostomy copious secretions was noted requiring repeated suctioning. Patient nonverbal unable to provide his own history but per family no recent fevers or chills. Patient has history of recurrent aspiration pneumonia. He was started on IV antibiotics treat possibly recurrent aspiration and transferred to [facility] long step down for further management  Regarding pertinent Chronic problems: CVA in 10/2016  now status post tracheostomy and PEG tube has been out of rehabilitation for the past 2 weeks  Of note patient was started on Levaquin on November 6 for presumed aspiration pneumonia based on abnormal lung sounds  Assessment & Plan:   Active Problems:   Aspiration pneumonia (HCC)   Tracheostomy status ([place])   Stage III pressure ulcer of sacral region ([place])   Loculated pleural effusion   Acute respiratory failure with hypoxia (HCC)   Gastrostomy tube obstruction (HCC)   PEG tube malfunction (HCC)  Acute respiratory failure with hypoxia ([place]) - Suspected due to aspiration vs increased secretions.   - receiving vanc/zosyn for presumed aspiration pneumonia, narrowed to zosyn - RVP  negative, influenza negative [ ]  sputum cx pending (has 2 resp cultures, the first growing acinetobacter) [ ]  blood cx NGTD - at previous d/c was on FiO2 0.28, not clear if he had been weaned off of this since discharge (I discussed with sister who notes he was only on humidified air at home).  D/c summary from 9/26 notes he was on 28% FiO2 (he's currently on this today)   Stage III pressure ulcer of sacral region ([place]) - resolving, continue to monitor  Loculated pleural effusion - chronic, mild currently afebrile, unlikely to be contributing to hypoxia.  Per CT, loculated effusion in L upper lobe and small LLL effusion similar to prior CT.  At previous hospitalization it looks like they decided against drainage.  If fevers or worsening effusion, would reconsult PCCM and c/s IR for pigtail catheter drainage.   Leukocytosis: Slightly increased from yesterday.  Continue to monitor.   HX of CVA - resume home medications when new functioning PEG is in place.   PEG tube malfunction - IR consult has been ordered, IR is aware of the patient.  Apparently in last hospitalization, there were multiple episodes where the tube waws clogged.  The freq of tube flushes was increased to 100 ml q3 and PEG tube brush was provided at discharge.     HTN: resume home clonidine, metoprolol.  Holding amlodipine for now.  Anemia: H/H about 1 point since admission.  Stable at this point around 8.  Will continue to monitor. Fe panel, B12, folate  DVT prophylaxis: SCD's Code Status: full  Family Communication: none at bedside Disposition Plan: pending improvement   Consultants:  Interventional Radiolog6  Procedures: (Don't include imaging studies which can be auto populated. Include things that cannot be auto populated i.e. Echo, Carotid and venous dopplers, Foley, Bipap, HD, tubes/drains, wound vac, central lines etc)  none  Antimicrobials: (specify start and planned stop date. Auto populated tables are  space occupying and do not give end dates) Anti-infectives (From admission, onward)   Start     Dose/Rate Route Frequency Ordered Stop   03/03/17 1200  vancomycin (VANCOCIN) IVPB 1000 mg/200 mL premix  Status:  Discontinued     1,000 mg 200 mL/hr over 60 Minutes Intravenous Every 24 hours 03/03/17 1046 03/03/17 1745   03/03/17 1000  vancomycin (VANCOCIN) 1,250 mg in sodium chloride 0.9 % 250 mL IVPB  Status:  Discontinued     1,250 mg 166.7 mL/hr over 90 Minutes Intravenous Every 24 hours 03/03/17 0017 03/03/17 1046   03/03/17 0200  piperacillin-tazobactam (ZOSYN) IVPB 3.375 g     3.375 g 12.5 mL/hr over 240 Minutes Intravenous Every 8 hours 03/02/17 2359     03/02/17 1900  vancomycin (VANCOCIN) IVPB 1000 mg/200 mL premix     1,000 mg 200 mL/hr over 60 Minutes Intravenous  Once 03/02/17 1848 03/02/17 2147   03/02/17 1900  piperacillin-tazobactam (ZOSYN) IVPB 3.375 g     3.375 g 100 mL/hr over 30 Minutes Intravenous  Once 03/02/17 1848 03/02/17 2040         Subjective: Nonverbal. Nods head yes and no (denies discomfort)  Objective: Vitals:   03/05/17 0826 03/05/17 1033 03/05/17 1150 03/05/17 1319  BP:  130/61  121/67  Pulse: 73 77 68 66  Resp: 18  18 18   Temp:    98.2 F (36.8 C)  TempSrc:    Axillary  SpO2: 97% 99% 99% 99%  Weight:      Height:        Intake/Output Summary (Last 24 hours) at 03/05/2017 1430 Last data filed at 03/05/2017 1300 Gross per 24 hour  Intake 970 ml  Output 1300 ml  Net -330 ml   Filed Weights   03/03/17 0000  Weight: 62.8 kg (138 lb 7.2 oz)    Examination:  General: No acute distress. Cardiovascular: Heart sounds show Keila Turan regular rate, and rhythm. No gallops or rubs. No murmurs. No JVD. Lungs: No increased wob, transmitted upper airway sounds.  Trach in place, secretions seem improved.. Abdomen: Soft, nontender, nondistended with normal active bowel sounds. No masses. No hepatosplenomegaly.  Gtube. Neurological: Nonverbal, but shakes  head yes and no.  Moves upper extremities. Skin: Warm and dry. No rashes or lesions. Extremities: No clubbing or cyanosis. No edema. Bilateral boots in place.  Data Reviewed: I have personally reviewed following labs and imaging studies  CBC: Recent Labs  Lab 03/02/17 1643 03/03/17 0337 03/04/17 0504 03/04/17 1354 03/05/17 0452  WBC 13.7* 14.5* 15.1*  --  13.3*  NEUTROABS 10.0*  --   --   --   --   HGB 10.0* 10.3* 8.5* 8.4* 8.8*  HCT 31.5* 33.0* 27.5* 27.2* 28.5*  MCV 88.0 88.9 88.7  --  89.1  PLT 673* 639* 509*  --  401*   Basic Metabolic Panel: Recent Labs  Lab 03/02/17 1643 03/03/17 0337 03/04/17 0504 03/05/17 0452  NA 139 141 140 138  K 4.8 4.1 4.0 3.8  CL 105 107 109 105  CO2 27 24 25 26   GLUCOSE 87 80 112* 116*  BUN 34* 25* 17 14  CREATININE 0.95 1.08 0.87 0.87  CALCIUM 10.2  10.0 9.4 9.8  MG  --  1.9  --   --   PHOS  --  3.7  --   --    GFR: Estimated Creatinine Clearance: 74.2 mL/min (by C-G formula based on SCr of 0.87 mg/dL). Liver Function Tests: Recent Labs  Lab 03/02/17 1643 03/03/17 0337  AST 21 17  ALT 28 18  ALKPHOS 75 76  BILITOT 0.3 0.6  PROT 8.3* 8.1  ALBUMIN 2.8* 2.7*   No results for input(s): LIPASE, AMYLASE in the last 168 hours. No results for input(s): AMMONIA in the last 168 hours. Coagulation Profile: No results for input(s): INR, PROTIME in the last 168 hours. Cardiac Enzymes: Recent Labs  Lab 03/02/17 1952  TROPONINI <0.03   BNP (last 3 results) No results for input(s): PROBNP in the last 8760 hours. HbA1C: No results for input(s): HGBA1C in the last 72 hours. CBG: No results for input(s): GLUCAP in the last 168 hours. Lipid Profile: No results for input(s): CHOL, HDL, LDLCALC, TRIG, CHOLHDL, LDLDIRECT in the last 72 hours. Thyroid Function Tests: Recent Labs    03/03/17 0337  TSH 1.835   Anemia Panel: No results for input(s): VITAMINB12, FOLATE, FERRITIN, TIBC, IRON, RETICCTPCT in the last 72 hours. Sepsis  Labs: Recent Labs  Lab 03/02/17 1940  LATICACIDVEN 0.41*    Recent Results (from the past 240 hour(s))  Blood culture (routine x 2)     Status: None (Preliminary result)   Collection Time: 03/02/17  7:48 PM  Result Value Ref Range Status   Specimen Description BLOOD BLOOD RIGHT ARM UPPER  Final   Special Requests   Final    BOTTLES DRAWN AEROBIC AND ANAEROBIC Blood Culture adequate volume   Culture   Final    NO GROWTH 3 DAYS Performed at [facility], [address] [place], [place] [address]    Report Status PENDING  Incomplete  Culture, respiratory (NON-Expectorated)     Status: None (Preliminary result)   Collection Time: 03/02/17  8:46 PM  Result Value Ref Range Status   Specimen Description TRACHEAL ASPIRATE  Final   Special Requests NONE  Final   Gram Stain   Final    MODERATE WBC PRESENT, PREDOMINANTLY PMN FEW SQUAMOUS EPITHELIAL CELLS PRESENT RARE GRAM POSITIVE RODS    Culture   Final    FEW ACINETOBACTER CALCOACETICUS/BAUMANNII COMPLEX REPEATING SENSITIVITIES Performed at [facility], [place] [address] [place], [place] [address]    Report Status PENDING  Incomplete  MRSA PCR Screening     Status: Abnormal   Collection Time: 03/02/17 11:17 PM  Result Value Ref Range Status   MRSA by PCR POSITIVE (Florette Thai) NEGATIVE Final    Comment:        The GeneXpert MRSA Assay (FDA approved for NASAL specimens only), is one component of Rafal Archuleta comprehensive MRSA colonization surveillance program. It is not intended to diagnose MRSA infection nor to guide or monitor treatment for MRSA infections. RESULT CALLED TO, READ BACK BY AND VERIFIED WITH: G MBEMENA,RN @11 /17/18 MKELLY   Blood culture (routine x 2)     Status: None (Preliminary result)   Collection Time: 03/03/17  3:37 AM  Result Value Ref Range Status   Specimen Description LEFT ANTECUBITAL  Final   Special Requests IN PEDIATRIC BOTTLE Blood Culture adequate volume  Final   Culture   Final    NO GROWTH 2  DAYS Performed at [facility], [place] [address], [place], [place] [address]  Report Status PENDING  Incomplete  Culture, respiratory (NON-Expectorated)     Status: None (Preliminary result)   Collection Time: 03/03/17  6:40 AM  Result Value Ref Range Status   Specimen Description TRACHEAL ASPIRATE  Final   Special Requests Immunocompromised  Final   Gram Stain   Final    ABUNDANT WBC PRESENT, PREDOMINANTLY PMN FEW SQUAMOUS EPITHELIAL CELLS PRESENT FEW GRAM POSITIVE RODS RARE GRAM NEGATIVE RODS RARE GRAM POSITIVE COCCI IN PAIRS    Culture   Final    CULTURE REINCUBATED FOR BETTER GROWTH Performed at [facility], [place] [address] [place], [place] [address]    Report Status PENDING  Incomplete  Respiratory Panel by PCR     Status: None   Collection Time: 03/03/17  9:19 AM  Result Value Ref Range Status   Adenovirus NOT DETECTED NOT DETECTED Final   Coronavirus 229E NOT DETECTED NOT DETECTED Final   Coronavirus HKU1 NOT DETECTED NOT DETECTED Final   Coronavirus NL63 NOT DETECTED NOT DETECTED Final   Coronavirus OC43 NOT DETECTED NOT DETECTED Final   Metapneumovirus NOT DETECTED NOT DETECTED Final   Rhinovirus / Enterovirus NOT DETECTED NOT DETECTED Final   Influenza Lakeisa Heninger NOT DETECTED NOT DETECTED Final   Influenza B NOT DETECTED NOT DETECTED Final   Parainfluenza Virus 1 NOT DETECTED NOT DETECTED Final   Parainfluenza Virus 2 NOT DETECTED NOT DETECTED Final   Parainfluenza Virus 3 NOT DETECTED NOT DETECTED Final   Parainfluenza Virus 4 NOT DETECTED NOT DETECTED Final   Respiratory Syncytial Virus NOT DETECTED NOT DETECTED Final   Bordetella pertussis NOT DETECTED NOT DETECTED Final   Chlamydophila pneumoniae NOT DETECTED NOT DETECTED Final   Mycoplasma pneumoniae NOT DETECTED NOT DETECTED Final         Radiology Studies: No results found.      Scheduled Meds: . aspirin  325 mg Per Tube Daily  . Chlorhexidine Gluconate Cloth  6 each Topical Q0600  .  cloNIDine  0.1 mg Per Tube TID  . clopidogrel  75 mg Per Tube Daily  . famotidine  40 mg Per Tube Daily  . free water  100 mL Per Tube Q3H  . mouth rinse  15 mL Mouth Rinse BID  . metoprolol tartrate  25 mg Per Tube BID  . mupirocin ointment  1 application Nasal BID  . thiamine  100 mg Per Tube Daily   Continuous Infusions: . feeding supplement (JEVITY 1.2 CAL) 1,000 mL (03/04/17 1021)  . piperacillin-tazobactam 3.375 g (03/05/17 1039)     LOS: 3 days    Time spent: over 20 minutes    Fayrene Helper, MD [facility] Pager [phone]  If 7PM-7AM, please contact night-coverage [email or web address] Password [email or web address] 03/05/2017, 2:30 PM

## 2017-03-05 NOTE — Consult Note (Signed)
Date of Admission:  03/02/2017          Reason for Consult: Acinetobacter pneumonia?   Referring Provider: Dr. Florene Glen   Assessment: 1. Chronic respiratory failure with tracheostomy 2. Increased secretions requiring suctioning 3. Acinetobacter from resp culture ? Pneumonia (none clearly seen on admisson CTA or CXR) 4. Loculated pleural effusion 5. Vascular Dementia 6. Decubitus ulcers 7. PEG tube malfunction  Plan: 1. Will check CT chest without contrast to re-evaluate his pleural effusions and his lung parenchyma 2. OK with change to meropenem. I predict the acinetobacter he grows from culture will be R to the zosyn and possibly meropenem he has been receiving 3. Would get palliative care consult 4. Sister is requesting air mattress to prevent bed sores  Active Problems:   Aspiration pneumonia (HCC)   Tracheostomy status (HCC)   Stage III pressure ulcer of sacral region ([place])   Loculated pleural effusion   Acute respiratory failure with hypoxia (HCC)   Gastrostomy tube obstruction (HCC)   PEG tube malfunction (HCC)   Scheduled Meds: . aspirin  325 mg Per Tube Daily  . Chlorhexidine Gluconate Cloth  6 each Topical Q0600  . cloNIDine  0.1 mg Per Tube TID  . clopidogrel  75 mg Per Tube Daily  . famotidine  40 mg Per Tube Daily  . free water  100 mL Per Tube Q3H  . mouth rinse  15 mL Mouth Rinse BID  . metoprolol tartrate  25 mg Per Tube BID  . mupirocin ointment  1 application Nasal BID  . thiamine  100 mg Per Tube Daily   Continuous Infusions: . feeding supplement (JEVITY 1.2 CAL) 1,000 mL (03/04/17 1021)  . meropenem (MERREM) IV     PRN Meds:.acetaminophen **OR** acetaminophen, albuterol, ipratropium-albuterol, ondansetron **OR** ondansetron (ZOFRAN) IV  HPI: Scott Rivera is a 66 y.o. male with vascular dementia, bed bound with decubitus ulcers and chronic hypoxemic respiratory failure with tracheostomy. He was brought from home due to problems with his PEG  tube, NOT acute respiratory problems. In ED [facility] though he had CTA apparently due to hypoxia which did NOT show PE but also did NOT show parenchymal lung disease, though it showed a loculated right sided pleural effusion. He was ultimately admitted to [facility] and begun on therapy for aspiration pneumonia with zosyn. A tracheal aspirate was taken on admission which is growing Acinetobacter. Note patient did NOT have a fever on admission either. He has had per sister increasing need for suctioning of secretions since admission.  It is not entirely clear to me that this patient has pneumonia. Need for oxygen and need for mechanical suctioning in a bed bound man with a tracheostomy does not in itself constitute diagnosis of pneumonia though it could be suggestive of this + other clinical indicators.   I furthermore do not know if the acinetobacter is a colonizer or a pathogen. Zosyn is unlikely to have been active vs this microbe yet patient has been stable without fevers or change in WBC since admission. He has had increased suctioning requirements.  I am ordering a CT scan of the lungs to re-evaluate his parenchyma and pleural effusion that could also be playing a role. I DOUBT IR or Pulmonary will want to approach the effusion.  I question WHY this man is receiving such aggressive care. His quality of life seems very very poor despite the clear compassion and care for him by his sister, her daughter and an aide. I  am just failing to see how this VERY high cost endeavor in aggressive care is going to change his outcome for the better short or long term to affect things in a positive way.   Review of Systems: Review of Systems  Unable to perform ROS: Dementia    Past Medical History:  Diagnosis Date  . Anemia due to chronic kidney disease   . Aspiration pneumonia ([place])   . Chronic kidney disease, stage 3 (moderate) (HCC)   . Hypertension   . Stroke [facility])     Social History   Tobacco Use  .  Smoking status: Former Research scientist (life sciences)  . Smokeless tobacco: Never Used  . Tobacco comment: Smoked heavily until the day of his stroke  Substance Use Topics  . Alcohol use: Yes    Comment: Drank heavily prior to stroke per sister  . Drug use: Not on file    Family History  Problem Relation Age of Onset  . Diabetes Mellitus II Brother   . CAD Neg Hx   . Stroke Neg Hx    No Known Allergies  OBJECTIVE: Blood pressure 121/67, pulse 66, temperature 98.2 F (36.8 C), temperature source Axillary, resp. rate 18, height 5\' 6"  (1.676 m), weight 138 lb 7.2 oz (62.8 kg), SpO2 99 %.  Physical Exam  Eyes: EOM are normal. Right eye exhibits no discharge. Left eye exhibits no discharge. No scleral icterus.  Neck: No tracheal deviation present.  Cardiovascular: Tachycardia present. Exam reveals no gallop and no friction rub.  No murmur heard. Pulmonary/Chest: No respiratory distress. He has rales.  Abdominal: Soft. He exhibits no distension. There is tenderness. There is no rebound.  Neurological: He is alert.  Skin: Skin is dry.  Psychiatric: He is agitated.  Patient is nonverbal  Nursing note and vitals reviewed.   Lab Results Lab Results  Component Value Date   WBC 13.3 (H) 03/05/2017   HGB 8.8 (L) 03/05/2017   HCT 28.5 (L) 03/05/2017   MCV 89.1 03/05/2017   PLT 587 (H) 03/05/2017    Lab Results  Component Value Date   CREATININE 0.87 03/05/2017   BUN 14 03/05/2017   NA 138 03/05/2017   K 3.8 03/05/2017   CL 105 03/05/2017   CO2 26 03/05/2017    Lab Results  Component Value Date   ALT 18 03/03/2017   AST 17 03/03/2017   ALKPHOS 76 03/03/2017   BILITOT 0.6 03/03/2017     Microbiology: Recent Results (from the past 240 hour(s))  Blood culture (routine x 2)     Status: None (Preliminary result)   Collection Time: 03/02/17  7:48 PM  Result Value Ref Range Status   Specimen Description BLOOD BLOOD RIGHT ARM UPPER  Final   Special Requests   Final    BOTTLES DRAWN AEROBIC AND  ANAEROBIC Blood Culture adequate volume   Culture   Final    NO GROWTH 3 DAYS Performed at [facility], [place] [address], [place], [place] [address]    Report Status PENDING  Incomplete  Culture, respiratory (NON-Expectorated)     Status: None (Preliminary result)   Collection Time: 03/02/17  8:46 PM  Result Value Ref Range Status   Specimen Description TRACHEAL ASPIRATE  Final   Special Requests NONE  Final   Gram Stain   Final    MODERATE WBC PRESENT, PREDOMINANTLY PMN FEW SQUAMOUS EPITHELIAL CELLS PRESENT RARE GRAM POSITIVE RODS    Culture   Final    FEW ACINETOBACTER CALCOACETICUS/BAUMANNII COMPLEX  REPEATING SENSITIVITIES Performed at [facility], [place] [address], [place], [place] [address]    Report Status PENDING  Incomplete  MRSA PCR Screening     Status: Abnormal   Collection Time: 03/02/17 11:17 PM  Result Value Ref Range Status   MRSA by PCR POSITIVE (A) NEGATIVE Final    Comment:        The GeneXpert MRSA Assay (FDA approved for NASAL specimens only), is one component of a comprehensive MRSA colonization surveillance program. It is not intended to diagnose MRSA infection nor to guide or monitor treatment for MRSA infections. RESULT CALLED TO, READ BACK BY AND VERIFIED WITH: G MBEMENA,RN @11 /17/18 MKELLY   Blood culture (routine x 2)     Status: None (Preliminary result)   Collection Time: 03/03/17  3:37 AM  Result Value Ref Range Status   Specimen Description LEFT ANTECUBITAL  Final   Special Requests IN PEDIATRIC BOTTLE Blood Culture adequate volume  Final   Culture   Final    NO GROWTH 2 DAYS Performed at [facility], [place] [address], [place], [place] [address]    Report Status PENDING  Incomplete  Culture, respiratory (NON-Expectorated)     Status: None (Preliminary result)   Collection Time: 03/03/17  6:40 AM  Result Value Ref Range Status   Specimen Description TRACHEAL ASPIRATE  Final   Special Requests Immunocompromised  Final    Gram Stain   Final    ABUNDANT WBC PRESENT, PREDOMINANTLY PMN FEW SQUAMOUS EPITHELIAL CELLS PRESENT FEW GRAM POSITIVE RODS RARE GRAM NEGATIVE RODS RARE GRAM POSITIVE COCCI IN PAIRS    Culture   Final    CULTURE REINCUBATED FOR BETTER GROWTH Performed at [facility], [place] [address], [place], [place] [address]    Report Status PENDING  Incomplete  Respiratory Panel by PCR     Status: None   Collection Time: 03/03/17  9:19 AM  Result Value Ref Range Status   Adenovirus NOT DETECTED NOT DETECTED Final   Coronavirus 229E NOT DETECTED NOT DETECTED Final   Coronavirus HKU1 NOT DETECTED NOT DETECTED Final   Coronavirus NL63 NOT DETECTED NOT DETECTED Final   Coronavirus OC43 NOT DETECTED NOT DETECTED Final   Metapneumovirus NOT DETECTED NOT DETECTED Final   Rhinovirus / Enterovirus NOT DETECTED NOT DETECTED Final   Influenza A NOT DETECTED NOT DETECTED Final   Influenza B NOT DETECTED NOT DETECTED Final   Parainfluenza Virus 1 NOT DETECTED NOT DETECTED Final   Parainfluenza Virus 2 NOT DETECTED NOT DETECTED Final   Parainfluenza Virus 3 NOT DETECTED NOT DETECTED Final   Parainfluenza Virus 4 NOT DETECTED NOT DETECTED Final   Respiratory Syncytial Virus NOT DETECTED NOT DETECTED Final   Bordetella pertussis NOT DETECTED NOT DETECTED Final   Chlamydophila pneumoniae NOT DETECTED NOT DETECTED Final   Mycoplasma pneumoniae NOT DETECTED NOT DETECTED Final    Alcide Evener, [facility] [place] [facility] [phone] pager   [phone] cell 03/05/2017, 3:37 PM

## 2017-03-05 NOTE — Care Management Important Message (Signed)
Important Message  Patient Details  Name: Scott Rivera MRN: [id number] Date of Birth: [birth year]   Medicare Important Message Given:  Yes    Kerin Salen 03/05/2017, 10:55 AMImportant Message  Patient Details  Name: Scott Rivera MRN: [id number] Date of Birth: 1950-06-20   Medicare Important Message Given:  Yes    Kerin Salen 03/05/2017, 10:55 AM

## 2017-03-05 NOTE — Progress Notes (Signed)
PT Cancellation/Screen Note  Patient Details Name: Scott Rivera MRN: [id number] DOB: 1951-02-15   Cancelled Treatment:    Reason Eval/Treat Not Completed: PT screened, no needs identified, will sign off. Per chart, pt is bedbound at home. Reviewed previous therapy notes, pt has been Max-Total Assist +2 since CVA in 10/2016. Briefly assessed pt in room, he did not follow commands or attempt to participate. Based on history and current presentation, pt appears to be total care at baseline. Will sign off at this time.    Weston Anna, MPT Pager: [phone]

## 2017-03-06 ENCOUNTER — Encounter (HOSPITAL_COMMUNITY): Payer: Self-pay

## 2017-03-06 LAB — CBC
HCT: 28.2 % — ABNORMAL LOW (ref 39.0–52.0)
Hemoglobin: 8.9 g/dL — ABNORMAL LOW (ref 13.0–17.0)
MCH: 28.1 pg (ref 26.0–34.0)
MCHC: 31.6 g/dL (ref 30.0–36.0)
MCV: 89 fL (ref 78.0–100.0)
Platelets: 562 10*3/uL — ABNORMAL HIGH (ref 150–400)
RBC: 3.17 MIL/uL — ABNORMAL LOW (ref 4.22–5.81)
RDW: 16.5 % — ABNORMAL HIGH (ref 11.5–15.5)
WBC: 12.4 10*3/uL — ABNORMAL HIGH (ref 4.0–10.5)

## 2017-03-06 LAB — IRON AND TIBC
Iron: 24 ug/dL — ABNORMAL LOW (ref 45–182)
Saturation Ratios: 13 % — ABNORMAL LOW (ref 17.9–39.5)
TIBC: 185 ug/dL — ABNORMAL LOW (ref 250–450)
UIBC: 161 ug/dL

## 2017-03-06 LAB — CULTURE, RESPIRATORY W GRAM STAIN

## 2017-03-06 LAB — BASIC METABOLIC PANEL
Anion gap: 7 (ref 5–15)
BUN: 14 mg/dL (ref 6–20)
CO2: 27 mmol/L (ref 22–32)
Calcium: 9.9 mg/dL (ref 8.9–10.3)
Chloride: 106 mmol/L (ref 101–111)
Creatinine, Ser: 0.87 mg/dL (ref 0.61–1.24)
GFR calc Af Amer: >60 mL/min (ref >60)
GFR calc non Af Amer: >60 mL/min (ref >60)
Glucose, Bld: 100 mg/dL — ABNORMAL HIGH (ref 65–99)
Potassium: 4.8 mmol/L (ref 3.5–5.1)
Sodium: 140 mmol/L (ref 135–145)

## 2017-03-06 LAB — VITAMIN B12: Vitamin B-12: 649 pg/mL (ref 180–914)

## 2017-03-06 LAB — FERRITIN: Ferritin: 1281 ng/mL — ABNORMAL HIGH (ref 24–336)

## 2017-03-06 LAB — FOLATE: Folate: 28 ng/mL (ref >5.9)

## 2017-03-06 NOTE — Progress Notes (Signed)
PROGRESS NOTE    Scott Rivera  XBL:[id number] DOB: 17-Apr-1951 DOA: 03/02/2017 PCP: Patient, No Pcp Per   Brief Narrative:  Scott Rivera is Scott Rivera 66 y.o. 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 On day of presentation he presented to [facility] with clogged feeding tube. Multiple attempts were made to declog it by ER  staff. The plan was for patient to have tube exchange by IR in [place] when he was noted to be hypoxic. Patient was placed on 40% FiO2 using tracheostomy copious secretions was noted requiring repeated suctioning. Patient nonverbal unable to provide his own history but per family no recent fevers or chills. Patient has history of recurrent aspiration pneumonia. He was started on IV antibiotics treat possibly recurrent aspiration and transferred to [facility] long step down for further management  Regarding pertinent Chronic problems: CVA in 10/2016  now status post tracheostomy and PEG tube has been out of rehabilitation for the past 2 weeks  Of note patient was started on Levaquin on November 6 for presumed aspiration pneumonia based on abnormal lung sounds  Assessment & Plan:   Active Problems:   Aspiration pneumonia (HCC)   Tracheostomy status ([place])   Stage III pressure ulcer of sacral region ([place])   HCAP (healthcare-associated pneumonia)   Loculated pleural effusion   Acute respiratory failure with hypoxia (HCC)   Gastrostomy tube obstruction (HCC)   PEG tube malfunction (HCC)   MDR Acinetobacter baumannii infection  Acute respiratory failure with hypoxia ([place]) - Initially suspected due to aspiration vs increased  secretions.  Discussed with ID today (11/20).  Low suspicion for true pneumonia given CTA without pneumonia on admission (just with loculated pleural effuison and small LLL pleural effusion similar to prior CT).  Unclear reason for his increased O2 requirement on presentation, but suspect acinetobacter likely colonizer.  Will d/c abx and defer repeat CT scan for now.  Had long discussion with sister.  She's concerned about the care he's getting here and expressed confusion about whether or not he had an infection (and also the fact that he came here for exchange of his gtube, but ended up being admitted).  He's currently on humidfied O2 here at 0.28 FiO2 (which is what he was discharged on at last discharge), but did well off trach collar per nursing today with normal sats.  His sister notes at home he was only on humidified air. - D/c abx after discussion with ID, will watch WBC and O2 sats, but possible d/c 11/21 - RVP negative, influenza negative [ ]  sputum cx pending (has 2 resp cultures, the first growing acinetobacter and the second with acinetobacter and diptheroids) [ ]  blood cx NGTD  Stage III pressure ulcer of sacral region - well healing  Loculated pleural effusion - chronic, currently afebrile, unlikely to be contributing to presentation.  Per CT, loculated effusion in L upper lobe and small LLL effusion similar to prior CT.  At previous hospitalization it looks like they decided against drainage.  If fevers or worsening effusion, would reconsult PCCM and c/s IR for pigtail catheter drainage.   Leukocytosis: Slightly increased from yesterday.  Continue to monitor.   HX of CVA - resume home medications when  new functioning PEG is in place.   PEG tube malfunction - IR consulted and was able to remove residual clogged feeding with enteral feeding tube declogger.  Apparently in last hospitalization, there were multiple episodes where the tube waws clogged.  The freq of tube flushes was  increased to 100 ml q3 and PEG tube brush was provided at discharge.     HTN: resume home clonidine, metoprolol.  Holding amlodipine for now.  Anemia: H/H stable.  Will continue to monitor. Iron panel suggestive of AOCD.    DVT prophylaxis: SCD's Code Status: full  Family Communication: none at bedside Disposition Plan: pending improvement   Consultants:   Interventional Radiolog6  Procedures: (Don't include imaging studies which can be auto populated. Include things that cannot be auto populated i.e. Echo, Carotid and venous dopplers, Foley, Bipap, HD, tubes/drains, wound vac, central lines etc)  none  Antimicrobials: (specify start and planned stop date. Auto populated tables are space occupying and do not give end dates) Anti-infectives (From admission, onward)   Start     Dose/Rate Route Frequency Ordered Stop   03/05/17 1600  meropenem (MERREM) 2 g in sodium chloride 0.9 % 100 mL IVPB  Status:  Discontinued     2 g 200 mL/hr over 30 Minutes Intravenous Every 8 hours 03/05/17 1535 03/06/17 1238   03/03/17 1200  vancomycin (VANCOCIN) IVPB 1000 mg/200 mL premix  Status:  Discontinued     1,000 mg 200 mL/hr over 60 Minutes Intravenous Every 24 hours 03/03/17 1046 03/03/17 1745   03/03/17 1000  vancomycin (VANCOCIN) 1,250 mg in sodium chloride 0.9 % 250 mL IVPB  Status:  Discontinued     1,250 mg 166.7 mL/hr over 90 Minutes Intravenous Every 24 hours 03/03/17 0017 03/03/17 1046   03/03/17 0200  piperacillin-tazobactam (ZOSYN) IVPB 3.375 g  Status:  Discontinued     3.375 g 12.5 mL/hr over 240 Minutes Intravenous Every 8 hours 03/02/17 2359 03/05/17 1508   03/02/17 1900  vancomycin (VANCOCIN) IVPB 1000 mg/200 mL premix     1,000 mg 200 mL/hr over 60 Minutes Intravenous  Once 03/02/17 1848 03/02/17 2147   03/02/17 1900  piperacillin-tazobactam (ZOSYN) IVPB 3.375 g     3.375 g 100 mL/hr over 30 Minutes Intravenous  Once 03/02/17 1848 03/02/17 2040          Subjective: Nonverbal. Shakes head no when asked if pain.  Objective: Vitals:   03/06/17 0327 03/06/17 0556 03/06/17 0831 03/06/17 0841  BP:  (!) 124/54 (!) 131/59   Pulse: 71 72 72 77  Resp: 18 18  19   Temp:  98 F (36.7 C)    TempSrc:  Axillary    SpO2: 96% 97%  97%  Weight:      Height:        Intake/Output Summary (Last 24 hours) at 03/06/2017 1238 Last data filed at 03/06/2017 1112 Gross per 24 hour  Intake 2480 ml  Output 1900 ml  Net 580 ml   Filed Weights   03/03/17 0000  Weight: 62.8 kg (138 lb 7.2 oz)    Examination:  General: No acute distress. Cardiovascular: Heart sounds show Eline Geng regular rate, and rhythm. No gallops or rubs. No murmurs. No JVD. Lungs: trach, transmitted upper airway sounds Abdomen: Soft, nontender, nondistended with normal active bowel sounds. No masses. No hepatosplenomegaly.  G tube. Neurological: Nonverbal, moves upper extremities.   Skin: Well healed sacral decub Extremities: No clubbing or cyanosis. Bilateral boots.  Data Reviewed: I have personally reviewed  following labs and imaging studies  CBC: Recent Labs  Lab 03/02/17 1643 03/03/17 0337 03/04/17 0504 03/04/17 1354 03/05/17 0452 03/06/17 0424  WBC 13.7* 14.5* 15.1*  --  13.3* 12.4*  NEUTROABS 10.0*  --   --   --   --   --   HGB 10.0* 10.3* 8.5* 8.4* 8.8* 8.9*  HCT 31.5* 33.0* 27.5* 27.2* 28.5* 28.2*  MCV 88.0 88.9 88.7  --  89.1 89.0  PLT 673* 639* 509*  --  587* 267*   Basic Metabolic Panel: Recent Labs  Lab 03/02/17 1643 03/03/17 0337 03/04/17 0504 03/05/17 0452 03/06/17 0424  NA 139 141 140 138 140  K 4.8 4.1 4.0 3.8 4.8  CL 105 107 109 105 106  CO2 27 24 25 26 27   GLUCOSE 87 80 112* 116* 100*  BUN 34* 25* 17 14 14   CREATININE 0.95 1.08 0.87 0.87 0.87  CALCIUM 10.2 10.0 9.4 9.8 9.9  MG  --  1.9  --   --   --   PHOS  --  3.7  --   --   --    GFR: Estimated Creatinine Clearance: 74.2 mL/min (by C-G formula based on SCr of 0.87 mg/dL). Liver  Function Tests: Recent Labs  Lab 03/02/17 1643 03/03/17 0337  AST 21 17  ALT 28 18  ALKPHOS 75 76  BILITOT 0.3 0.6  PROT 8.3* 8.1  ALBUMIN 2.8* 2.7*   No results for input(s): LIPASE, AMYLASE in the last 168 hours. No results for input(s): AMMONIA in the last 168 hours. Coagulation Profile: No results for input(s): INR, PROTIME in the last 168 hours. Cardiac Enzymes: Recent Labs  Lab 03/02/17 1952  TROPONINI <0.03   BNP (last 3 results) No results for input(s): PROBNP in the last 8760 hours. HbA1C: No results for input(s): HGBA1C in the last 72 hours. CBG: No results for input(s): GLUCAP in the last 168 hours. Lipid Profile: No results for input(s): CHOL, HDL, LDLCALC, TRIG, CHOLHDL, LDLDIRECT in the last 72 hours. Thyroid Function Tests: No results for input(s): TSH, T4TOTAL, FREET4, T3FREE, THYROIDAB in the last 72 hours. Anemia Panel: Recent Labs    03/05/17 1453  VITAMINB12 649  FOLATE 28.0  FERRITIN 1,281*  TIBC 185*  IRON 24*   Sepsis Labs: Recent Labs  Lab 03/02/17 1940 03/05/17 1558  PROCALCITON  --  <0.10  LATICACIDVEN 0.41*  --     Recent Results (from the past 240 hour(s))  Blood culture (routine x 2)     Status: None (Preliminary result)   Collection Time: 03/02/17  7:48 PM  Result Value Ref Range Status   Specimen Description BLOOD BLOOD RIGHT ARM UPPER  Final   Special Requests   Final    BOTTLES DRAWN AEROBIC AND ANAEROBIC Blood Culture adequate volume   Culture   Final    NO GROWTH 4 DAYS Performed at [facility], [place] [address], [place], [place] [address]    Report Status PENDING  Incomplete  Culture, respiratory (NON-Expectorated)     Status: None   Collection Time: 03/02/17  8:46 PM  Result Value Ref Range Status   Specimen Description TRACHEAL ASPIRATE  Final   Special Requests NONE  Final   Gram Stain   Final    MODERATE WBC PRESENT, PREDOMINANTLY PMN FEW SQUAMOUS EPITHELIAL CELLS PRESENT RARE GRAM POSITIVE  RODS Performed at [facility], [name] [address] [place],  [address]    Culture   Final  FEW ACINETOBACTER CALCOACETICUS/BAUMANNII COMPLEX MULTI-DRUG RESISTANT ORGANISM    Report Status 03/06/2017 FINAL  Final  MRSA PCR Screening     Status: Abnormal   Collection Time: 03/02/17 11:17 PM  Result Value Ref Range Status   MRSA by PCR POSITIVE (Donelle Baba) NEGATIVE Final    Comment:        The GeneXpert MRSA Assay (FDA approved for NASAL specimens only), is one component of Jacobo Moncrief comprehensive MRSA colonization surveillance program. It is not intended to diagnose MRSA infection nor to guide or monitor treatment for MRSA infections. RESULT CALLED TO, READ BACK BY AND VERIFIED WITH: G MBEMENA,RN @11 /17/18 MKELLY   Blood culture (routine x 2)     Status: None (Preliminary result)   Collection Time: 03/03/17  3:37 AM  Result Value Ref Range Status   Specimen Description LEFT ANTECUBITAL  Final   Special Requests IN PEDIATRIC BOTTLE Blood Culture adequate volume  Final   Culture   Final    NO GROWTH 3 DAYS Performed at [facility], [place] [address] [place], [place] [address]    Report Status PENDING  Incomplete  Culture, respiratory (NON-Expectorated)     Status: None (Preliminary result)   Collection Time: 03/03/17  6:40 AM  Result Value Ref Range Status   Specimen Description TRACHEAL ASPIRATE  Final   Special Requests Immunocompromised  Final   Gram Stain   Final    ABUNDANT WBC PRESENT, PREDOMINANTLY PMN FEW SQUAMOUS EPITHELIAL CELLS PRESENT FEW GRAM POSITIVE RODS RARE GRAM NEGATIVE RODS RARE GRAM POSITIVE COCCI IN PAIRS    Culture   Final    MODERATE ACINETOBACTER CALCOACETICUS/BAUMANNII COMPLEX SUSCEPTIBILITIES PERFORMED ON PREVIOUS CULTURE WITHIN THE LAST 5 DAYS. MODERATE DIPHTHEROIDS(CORYNEBACTERIUM SPECIES) Standardized susceptibility testing for this organism is not available. Performed at [facility], [place] [address] [place], [place] [address]     Report Status PENDING  Incomplete  Respiratory Panel by PCR     Status: None   Collection Time: 03/03/17  9:19 AM  Result Value Ref Range Status   Adenovirus NOT DETECTED NOT DETECTED Final   Coronavirus 229E NOT DETECTED NOT DETECTED Final   Coronavirus HKU1 NOT DETECTED NOT DETECTED Final   Coronavirus NL63 NOT DETECTED NOT DETECTED Final   Coronavirus OC43 NOT DETECTED NOT DETECTED Final   Metapneumovirus NOT DETECTED NOT DETECTED Final   Rhinovirus / Enterovirus NOT DETECTED NOT DETECTED Final   Influenza Arlyne Brandes NOT DETECTED NOT DETECTED Final   Influenza B NOT DETECTED NOT DETECTED Final   Parainfluenza Virus 1 NOT DETECTED NOT DETECTED Final   Parainfluenza Virus 2 NOT DETECTED NOT DETECTED Final   Parainfluenza Virus 3 NOT DETECTED NOT DETECTED Final   Parainfluenza Virus 4 NOT DETECTED NOT DETECTED Final   Respiratory Syncytial Virus NOT DETECTED NOT DETECTED Final   Bordetella pertussis NOT DETECTED NOT DETECTED Final   Chlamydophila pneumoniae NOT DETECTED NOT DETECTED Final   Mycoplasma pneumoniae NOT DETECTED NOT DETECTED Final         Radiology Studies: No results found.      Scheduled Meds: . aspirin  325 mg Per Tube Daily  . Chlorhexidine Gluconate Cloth  6 each Topical Q0600  . cloNIDine  0.1 mg Per Tube TID  . clopidogrel  75 mg Per Tube Daily  . famotidine  40 mg Per Tube Daily  . free water  100 mL Per Tube Q3H  . mouth rinse  15 mL Mouth Rinse BID  . metoprolol tartrate  25 mg  Per Tube BID  . mupirocin ointment  1 application Nasal BID  . thiamine  100 mg Per Tube Daily   Continuous Infusions: . feeding supplement (JEVITY 1.2 CAL) 1,000 mL (03/05/17 2011)     LOS: 4 days    Time spent: over 20 minutes    Fayrene Helper, MD [facility] Pager [phone]  If 7PM-7AM, please contact night-coverage [email or web address] Password [facility] 03/06/2017, 12:38 PM

## 2017-03-06 NOTE — Progress Notes (Signed)
CRITICAL VALUE ALERT  Critical Value:  trachael aspirate +MDRO  Date & Time Notied:  03/06/17 1010  Provider Notified: Dr Florene Glen  Orders Received/Actions taken: no new orders at this time

## 2017-03-06 NOTE — Progress Notes (Signed)
Subjective:  Pt not able to verbalize   Antibiotics:  Anti-infectives (From admission, onward)   Start     Dose/Rate Route Frequency Ordered Stop   03/05/17 1600  meropenem (MERREM) 2 g in sodium chloride 0.9 % 100 mL IVPB  Status:  Discontinued     2 g 200 mL/hr over 30 Minutes Intravenous Every 8 hours 03/05/17 1535 03/06/17 1238   03/03/17 1200  vancomycin (VANCOCIN) IVPB 1000 mg/200 mL premix  Status:  Discontinued     1,000 mg 200 mL/hr over 60 Minutes Intravenous Every 24 hours 03/03/17 1046 03/03/17 1745   03/03/17 1000  vancomycin (VANCOCIN) 1,250 mg in sodium chloride 0.9 % 250 mL IVPB  Status:  Discontinued     1,250 mg 166.7 mL/hr over 90 Minutes Intravenous Every 24 hours 03/03/17 0017 03/03/17 1046   03/03/17 0200  piperacillin-tazobactam (ZOSYN) IVPB 3.375 g  Status:  Discontinued     3.375 g 12.5 mL/hr over 240 Minutes Intravenous Every 8 hours 03/02/17 2359 03/05/17 1508   03/02/17 1900  vancomycin (VANCOCIN) IVPB 1000 mg/200 mL premix     1,000 mg 200 mL/hr over 60 Minutes Intravenous  Once 03/02/17 1848 03/02/17 2147   03/02/17 1900  piperacillin-tazobactam (ZOSYN) IVPB 3.375 g     3.375 g 100 mL/hr over 30 Minutes Intravenous  Once 03/02/17 1848 03/02/17 2040      Medications: Scheduled Meds: . aspirin  325 mg Per Tube Daily  . Chlorhexidine Gluconate Cloth  6 each Topical Q0600  . cloNIDine  0.1 mg Per Tube TID  . clopidogrel  75 mg Per Tube Daily  . famotidine  40 mg Per Tube Daily  . free water  100 mL Per Tube Q3H  . mouth rinse  15 mL Mouth Rinse BID  . metoprolol tartrate  25 mg Per Tube BID  . mupirocin ointment  1 application Nasal BID  . thiamine  100 mg Per Tube Daily   Continuous Infusions: . feeding supplement (JEVITY 1.2 CAL) 1,000 mL (03/05/17 2011)   PRN Meds:.acetaminophen **OR** acetaminophen, albuterol, ipratropium-albuterol, ondansetron **OR** ondansetron (ZOFRAN) IV    Objective: Weight change:   Intake/Output  Summary (Last 24 hours) at 03/06/2017 1445 Last data filed at 03/06/2017 1112 Gross per 24 hour  Intake 2480 ml  Output 1900 ml  Net 580 ml   Blood pressure (!) 131/59, pulse 69, temperature 98 F (36.7 C), temperature source Axillary, resp. rate 20, height 5\' 6"  (1.676 m), weight 138 lb 7.2 oz (62.8 kg), SpO2 98 %. Temp:  [97.4 F (36.3 C)-98 F (36.7 C)] 98 F (36.7 C) (11/20 0556) Pulse Rate:  [69-81] 69 (11/20 1200) Resp:  [16-20] 20 (11/20 1200) BP: (124-131)/(54-61) 131/59 (11/20 0831) SpO2:  [96 %-98 %] 98 % (11/20 1200) FiO2 (%):  [28 %] 28 % (11/20 1200)  Physical Exam: General: Alert and awake,sister holding his hand HEENT: anicteric sclera,EOMI CVS regular rate, normal r,  no murmur rubs or gallops Chest: rhonchorous Abdomen: soft nontender, nondistended, normal bowel sounds, Extremities: no  clubbing or edema noted bilaterally Neuro: no new focal deficits  CBC:  CBC Latest Ref Rng & Units 03/06/2017 03/05/2017 03/04/2017  WBC 4.0 - 10.5 K/uL 12.4(H) 13.3(H) -  Hemoglobin 13.0 - 17.0 g/dL 8.9(L) 8.8(L) 8.4(L)  Hematocrit 39.0 - 52.0 % 28.2(L) 28.5(L) 27.2(L)  Platelets 150 - 400 K/uL 562(H) 587(H) -     BMET Recent Labs    03/05/17 0452 03/06/17 0424  NA 138 140  K 3.8 4.8  CL 105 106  CO2 26 27  GLUCOSE 116* 100*  BUN 14 14  CREATININE 0.87 0.87  CALCIUM 9.8 9.9     Liver Panel  No results for input(s): PROT, ALBUMIN, AST, ALT, ALKPHOS, BILITOT, BILIDIR, IBILI in the last 72 hours.     Sedimentation Rate No results for input(s): ESRSEDRATE in the last 72 hours. C-Reactive Protein No results for input(s): CRP in the last 72 hours.  Micro Results: Recent Results (from the past 720 hour(s))  Blood culture (routine x 2)     Status: None (Preliminary result)   Collection Time: 03/02/17  7:48 PM  Result Value Ref Range Status   Specimen Description BLOOD BLOOD RIGHT ARM UPPER  Final   Special Requests   Final    BOTTLES DRAWN AEROBIC AND  ANAEROBIC Blood Culture adequate volume   Culture   Final    NO GROWTH 4 DAYS Performed at [facility], [address], [place], [place] [address]    Report Status PENDING  Incomplete  Culture, respiratory (NON-Expectorated)     Status: None   Collection Time: 03/02/17  8:46 PM  Result Value Ref Range Status   Specimen Description TRACHEAL ASPIRATE  Final   Special Requests NONE  Final   Gram Stain   Final    MODERATE WBC PRESENT, PREDOMINANTLY PMN FEW SQUAMOUS EPITHELIAL CELLS PRESENT RARE GRAM POSITIVE RODS Performed at [facility], [place] [address] [place], [place] [address]    Culture   Final    FEW ACINETOBACTER CALCOACETICUS/BAUMANNII COMPLEX MULTI-DRUG RESISTANT ORGANISM    Report Status 03/06/2017 FINAL  Final  MRSA PCR Screening     Status: Abnormal   Collection Time: 03/02/17 11:17 PM  Result Value Ref Range Status   MRSA by PCR POSITIVE (A) NEGATIVE Final    Comment:        The GeneXpert MRSA Assay (FDA approved for NASAL specimens only), is one component of a comprehensive MRSA colonization surveillance program. It is not intended to diagnose MRSA infection nor to guide or monitor treatment for MRSA infections. RESULT CALLED TO, READ BACK BY AND VERIFIED WITH: G MBEMENA,RN @11 /17/18 MKELLY   Blood culture (routine x 2)     Status: None (Preliminary result)   Collection Time: 03/03/17  3:37 AM  Result Value Ref Range Status   Specimen Description LEFT ANTECUBITAL  Final   Special Requests IN PEDIATRIC BOTTLE Blood Culture adequate volume  Final   Culture   Final    NO GROWTH 3 DAYS Performed at [facility], [place] [address], [place], [place] [address]    Report Status PENDING  Incomplete  Culture, respiratory (NON-Expectorated)     Status: None (Preliminary result)   Collection Time: 03/03/17  6:40 AM  Result Value Ref Range Status   Specimen Description TRACHEAL ASPIRATE  Final   Special Requests Immunocompromised  Final   Gram Stain    Final    ABUNDANT WBC PRESENT, PREDOMINANTLY PMN FEW SQUAMOUS EPITHELIAL CELLS PRESENT FEW GRAM POSITIVE RODS RARE GRAM NEGATIVE RODS RARE GRAM POSITIVE COCCI IN PAIRS    Culture   Final    MODERATE ACINETOBACTER CALCOACETICUS/BAUMANNII COMPLEX SUSCEPTIBILITIES PERFORMED ON PREVIOUS CULTURE WITHIN THE LAST 5 DAYS. MODERATE DIPHTHEROIDS(CORYNEBACTERIUM SPECIES) Standardized susceptibility testing for this organism is not available. Performed at [facility], [place] [address] [place], [place] [address]    Report Status PENDING  Incomplete  Respiratory  Panel by PCR     Status: None   Collection Time: 03/03/17  9:19 AM  Result Value Ref Range Status   Adenovirus NOT DETECTED NOT DETECTED Final   Coronavirus 229E NOT DETECTED NOT DETECTED Final   Coronavirus HKU1 NOT DETECTED NOT DETECTED Final   Coronavirus NL63 NOT DETECTED NOT DETECTED Final   Coronavirus OC43 NOT DETECTED NOT DETECTED Final   Metapneumovirus NOT DETECTED NOT DETECTED Final   Rhinovirus / Enterovirus NOT DETECTED NOT DETECTED Final   Influenza A NOT DETECTED NOT DETECTED Final   Influenza B NOT DETECTED NOT DETECTED Final   Parainfluenza Virus 1 NOT DETECTED NOT DETECTED Final   Parainfluenza Virus 2 NOT DETECTED NOT DETECTED Final   Parainfluenza Virus 3 NOT DETECTED NOT DETECTED Final   Parainfluenza Virus 4 NOT DETECTED NOT DETECTED Final   Respiratory Syncytial Virus NOT DETECTED NOT DETECTED Final   Bordetella pertussis NOT DETECTED NOT DETECTED Final   Chlamydophila pneumoniae NOT DETECTED NOT DETECTED Final   Mycoplasma pneumoniae NOT DETECTED NOT DETECTED Final    Studies/Results: No results found.    Assessment/Plan:  INTERVAL HISTORY: Procalcitonin normal. MDRO Acinetobacter isolated from trachea   Active Problems:   Aspiration pneumonia (HCC)   Tracheostomy status (HCC)   Stage III pressure ulcer of sacral region ([place])   HCAP (healthcare-associated pneumonia)   Loculated pleural  effusion   Acute respiratory failure with hypoxia ([place])   Gastrostomy tube obstruction (HCC)   PEG tube malfunction (HCC)   MDR Acinetobacter baumannii infection    Scott Rivera is a 66 y.o. male with CVA, vascular dementia, chronic respiratory failure with admisssion for mal-function of PEG tube. He was placed on abx for possible PNA due to him being hypoxic but he never had a fever and no infiltrate on CXR or CT scan. He has already received 5 days of broad spectrum abx. His PCT is normal  I DONT think he has a PNA with MDR Acinetobacter and am skeptical of him ever haVING HAD one  I will DC the carbapenem  I would continue the mechanical interventions  I spent greater than 35 minutes with the patient including greater than 50% of time in face to face counsel of the patient and his sister re the risks for continuing broad spectrum abx and selecting for MDRO organisms and in coordination of his care with Dr. Florene Glen.    LOS: 4 days   Alcide Evener 03/06/2017, 2:45 PM

## 2017-03-07 DIAGNOSIS — J189 Pneumonia, unspecified organism: Secondary | ICD-10-CM

## 2017-03-07 DIAGNOSIS — L89153 Pressure ulcer of sacral region, stage 3: Secondary | ICD-10-CM

## 2017-03-07 LAB — CBC
HCT: 29.2 % — ABNORMAL LOW (ref 39.0–52.0)
Hemoglobin: 9.2 g/dL — ABNORMAL LOW (ref 13.0–17.0)
MCH: 28 pg (ref 26.0–34.0)
MCHC: 31.5 g/dL (ref 30.0–36.0)
MCV: 88.8 fL (ref 78.0–100.0)
Platelets: 573 10*3/uL — ABNORMAL HIGH (ref 150–400)
RBC: 3.29 MIL/uL — ABNORMAL LOW (ref 4.22–5.81)
RDW: 16.2 % — ABNORMAL HIGH (ref 11.5–15.5)
WBC: 11.6 10*3/uL — ABNORMAL HIGH (ref 4.0–10.5)

## 2017-03-07 LAB — BASIC METABOLIC PANEL
Anion gap: 7 (ref 5–15)
BUN: 15 mg/dL (ref 6–20)
CO2: 26 mmol/L (ref 22–32)
Calcium: 9.9 mg/dL (ref 8.9–10.3)
Chloride: 103 mmol/L (ref 101–111)
Creatinine, Ser: 0.81 mg/dL (ref 0.61–1.24)
GFR calc Af Amer: >60 mL/min (ref >60)
GFR calc non Af Amer: >60 mL/min (ref >60)
Glucose, Bld: 111 mg/dL — ABNORMAL HIGH (ref 65–99)
Potassium: 4.2 mmol/L (ref 3.5–5.1)
Sodium: 136 mmol/L (ref 135–145)

## 2017-03-07 LAB — CULTURE, RESPIRATORY W GRAM STAIN

## 2017-03-07 LAB — CULTURE, BLOOD (ROUTINE X 2)
Culture: NO GROWTH
Special Requests: ADEQUATE

## 2017-03-07 NOTE — Progress Notes (Signed)
Pt from home with sister and has [facility] for home health services. He is on RA with his trach at home per sister and receives tube feeds from [facility]. Pt with home 02 order but does not qualify for home 02. [employer] rep alerted of dc back home. Medical Necessity form filled out for staff RN to call [facility] when pt ready for discharge. Marney Doctor RN,BSN,NCM [phone]

## 2017-03-07 NOTE — Discharge Summary (Signed)
Physician Discharge Summary  Scott Rivera:[id number] DOB: 10-05-1950 DOA: 03/02/2017  PCP: Patient, No Pcp Per  Admit date: 03/02/2017 Discharge date: 03/07/2017  Admitted From: Home Disposition: Home   Recommendations for Outpatient Follow-up:  1. Follow up with PCP in 1-2 weeks 2. 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 Multiple attempts were made to declog it by ER staff. The plan was for patient to have tube exchange by IR in a.m. when he was noted to be hypoxic. Patient was placed on 40% FiO2 using tracheostomy copious secretions was noted requiring repeated suctioning. He was started on IV antibiotics treat possibly recurrent aspiration and transferred to [facility] long step down for further management. CXR did not demonstrate an infiltrate. Procalcitonin remained negative. ID was consulted and recommended observation off of antibiotics. He has remained afebrile without symptoms of pneumonia since that time. Observation without continuous oxygen has demonstrated resolution of hypoxia. He is stable for discharge.   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 ID consulted, feeling low suspicion for true pneumonia given CTA without pneumonia on admission (just with loculated pleural effuison and small LLL pleural effusion similar to prior CT).  Unclear reason for his increased O2 requirement on presentation, but suspect acinetobacter likely colonizer. RVP negative, influenza negative - Oxygen requirement has resolved. His sister notes at home he was only on humidified air at home, though I'm not sure how this would be delivered without oxygen. - Monitor sputum and blood cultures (NGTD at discharge)  Stage III pressure ulcer of sacral region - well healing  Loculated pleural effusion- chronic, currently afebrile, unlikely to be contributing to presentation.  Per CT, loculated effusion in L upper lobe and small LLL effusion similar to prior CT.  At previous hospitalization it looks like they decided against drainage.  Leukocytosis: Also appears chronic, trending downward.   HX of CVA: Resume home medications  PEG tube malfunction - IR consulted and was able to remove residual clogged feeding with enteral feeding tube declogger.  Apparently in last hospitalization, there were multiple episodes where the tube was clogged.  The freq of tube flushes was increased to 100 ml q3 and PEG tube brush was provided at discharge.    HTN: resume home medications as below  Anemia of chronic disease: H/H stable.  Will continue to monitor at follow up  Discharge Instructions Discharge Instructions    Call MD for:  difficulty breathing, headache or visual disturbances   Complete by:  As directed    Call MD for:  persistant nausea and vomiting   Complete by:  As directed    Call MD for:  severe uncontrolled  pain   Complete by:  As directed    Call MD for:  temperature  >100.4   Complete by:  As directed    Increase activity slowly   Complete by:  As directed      Allergies as of 03/07/2017   No Known Allergies     Medication List    STOP taking these medications   chlorhexidine gluconate (MEDLINE KIT) 0.12 % solution Commonly known as:  PERIDEX   levofloxacin 750 MG tablet Commonly known as:  LEVAQUIN   multivitamin with minerals Tabs tablet     TAKE these medications   acetaminophen 500 MG tablet Commonly known as:  TYLENOL Place 1,000 mg 3 (three) times daily into feeding tube. What changed:  Another medication with the same name was removed. Continue taking this medication, and follow the directions you see here.   amLODipine 10 MG tablet Commonly known as:  NORVASC Place 1 tablet (10 mg total) into feeding tube daily.   aspirin 325 MG tablet Place 1 tablet (325 mg total) into feeding tube daily.   cloNIDine 0.1 MG tablet Commonly known as:  CATAPRES Place 1 tablet (0.1 mg total) into feeding tube 3 (three) times daily.   clopidogrel 75 MG tablet Commonly known as:  PLAVIX Place 1 tablet (75 mg total) into feeding tube daily.   famotidine 40 MG tablet Commonly known as:  PEPCID Place 40 mg daily into feeding tube. What changed:  Another medication with the same name was removed. Continue taking this medication, and follow the directions you see here.   feeding supplement (JEVITY 1.2 CAL) Liqd Place 1,000 mLs into feeding tube continuous. What changed:  additional instructions   free water Soln Place 100 mLs into feeding tube every 3 (three) hours. What changed:    how much to take  when to take this  additional instructions   gabapentin 100 MG capsule Commonly known as:  NEURONTIN Take 100 mg See admin instructions by mouth. Open 1 capsule (100 mg) and administer contents via feeding tube three times daily   ipratropium-albuterol 0.5-2.5 (3) MG/3ML Soln Commonly known as:  DUONEB Take 3 mLs by nebulization  every 4 (four) hours as needed. What changed:  reasons to take this   metoprolol tartrate 25 MG tablet Commonly known as:  LOPRESSOR Place 25 mg 2 (two) times daily into feeding tube. What changed:  Another medication with the same name was removed. Continue taking this medication, and follow the directions you see here.   mouth rinse Liqd solution 15 mLs by Mouth Rinse route QID. What changed:  when to take this   pravastatin 20 MG tablet Commonly known as:  PRAVACHOL Place 1 tablet (20 mg total) into feeding tube daily at 6 PM. What changed:  when to take this   thiamine 100 MG tablet Place 1 tablet (100 mg total) into feeding tube daily.       No Known Allergies  Consultations:  IR  ID  Procedures/Studies: Dg Chest 2 View  Result Date: 02/09/2017 CLINICAL DATA:  Acute onset of hemoptysis after self-removal of tracheostomy tube. Initial encounter. EXAM: CHEST  2 VIEW COMPARISON:  Chest radiograph performed 01/07/2017 FINDINGS: The patient's tracheostomy tube is seen ending 5 cm above the carina. Right basilar airspace opacity raises concern for pneumonia. Aspiration cannot be excluded, given clinical presentation. No pleural effusion or pneumothorax is seen. The heart is normal in size. No acute osseous abnormalities are identified. Dense sclerosis is again noted at the  left humeral head. IMPRESSION: 1. Tracheostomy tube seen ending 5 cm above the carina. 2. Right basilar airspace opacity raises concern for pneumonia. Aspiration cannot be excluded, given clinical presentation. Electronically Signed   By: Garald Balding M.D.   On: 02/09/2017 04:17   Ct Angio Chest Pe W And/or Wo Contrast  Addendum Date: 03/02/2017   ADDENDUM REPORT: 03/02/2017 21:38 ADDENDUM: There is occluded appearance of the visualized origin of the left vertebral artery, age indeterminate, possibly chronic. If there is clinical concern for acute occlusion or acute infarct further evaluation with CT  angiography of the head and neck recommended. Electronically Signed   By: Anner Crete M.D.   On: 03/02/2017 21:38   Result Date: 03/02/2017 CLINICAL DATA:  66 year old male with tracheostomy fluid and concern for fracture of tracheostomy. Patient presenting with respiratory findings concerning for PE. EXAM: CT ANGIOGRAPHY CHEST WITH CONTRAST TECHNIQUE: Multidetector CT imaging of the chest was performed using the standard protocol during bolus administration of intravenous contrast. Multiplanar CT image reconstructions and MIPs were obtained to evaluate the vascular anatomy. CONTRAST:  177m ISOVUE-370 IOPAMIDOL (ISOVUE-370) INJECTION 76% COMPARISON:  Chest radiograph dated 03/02/2017 FINDINGS: Cardiovascular: There is no cardiomegaly or pericardial effusion. Coronary vascular calcification primarily involving the LAD. Focal area of coarse calcification in the anterior pericardium similar to prior CT. There is moderate atherosclerotic calcification of the thoracic aorta. There is atherosclerotic calcification of the origin of the left common carotid artery. The visualized proximal portion of the left vertebral artery appears occluded, age indeterminate, likely chronic. If there is clinical concern for acute conclusion or acute infarct further evaluation with CT angiography of the head and neck recommended. Evaluation of the pulmonary arteries is somewhat limited due to respiratory motion artifact and suboptimal visualization of the peripheral branches. No CT evidence of pulmonary artery embolus in the visualized segment of the vasculature. Mediastinum/Nodes: There is no hilar or mediastinal adenopathy. The esophagus is grossly unremarkable. The left thyroid gland is not visualized, likely surgically absent. Multiple mildly enlarged supraclavicular lymph nodes noted bilaterally measuring up to 12 mm on the left. Clinical correlation is recommended. Lungs/Pleura: There is a background of mild emphysema.  Bibasilar linear atelectasis/scarring noted. There is a small loculated appearing pleural effusion in the left upper lobe posteriorly extending to the lingula. A small pleural effusion is seen in the left lower lobe similar to prior CT. Overall there has been improved aeration of the left lung base compared to the prior CT. There is no pneumothorax. Mucus secretion material noted in the trachea and bilateral mainstem bronchi. A tracheostomy is noted with tip above the carina. Upper Abdomen: An ill-defined 12 mm low attenuating focus in the anterior right lobe of the liver is not well characterized. Evaluation of the upper abdomen is limited due to streak artifact caused by patient's arms. Musculoskeletal: There is degenerative changes of the spine. 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 No acute osseous pathology. Review of the MIP images confirms the above findings. IMPRESSION: 1. No CT evidence of central pulmonary artery embolus. 2. Loculated pleural effusion in the left upper lobe as well as a small left lower lobe pleural effusion similar to prior CT. Overall improvement of the aeration of the left lung base compared to the prior CT. 3. Tracheostomy with tip above the carina. There is mucus secretion content within the central airways. 4. Aortic Atherosclerosis (ICD10-I70.0) and Emphysema (ICD10-J43.9). 5. Mildly enlarged bilateral supraclavicular lymph nodes. Clinical correlation is recommended. Electronically Signed: By: AMilas Hock  Radparvar M.D. On: 03/02/2017 21:15   Dg Chest Portable 1 View  Result Date: 03/02/2017 CLINICAL DATA:  Cough and congestion. History of pleural effusion. Tracheostomy. EXAM: PORTABLE CHEST 1 VIEW COMPARISON:  Chest x-rays dated 02/24/2017 and 01/07/2017. FINDINGS: Heart size and mediastinal contours are stable. Tracheostomy tube appears appropriately positioned in the midline. Interstitial markings are more prominent on today's study suggesting interstitial  edema. Persistent opacity along the left lateral chest wall, compatible with the loculated appearing effusion demonstrated on chest CT of 12/29/2016. IMPRESSION: 1. Probable bilateral interstitial edema suggesting mild CHF/volume overload. 2. Persistent opacity along the left lateral chest wall, similar to earlier chest x-rays of 01/07/2017 and 12/31/2016, compatible with the loculated appearing pleural effusion demonstrated on chest CT of 12/29/2016. 3. Tracheostomy tube appears appropriately positioned in the midline. Electronically Signed   By: Franki Cabot M.D.   On: 03/02/2017 17:56   Dg Chest Port 1 View  Result Date: 02/24/2017 CLINICAL DATA:  66 y/o  M; shortness of breath. EXAM: PORTABLE CHEST 1 VIEW COMPARISON:  02/09/2017 chest radiograph. FINDINGS: Stable cardiac silhouette within normal limits given projection and technique. Tracheostomy tube in situ. Surgical clips project over the left medial clavicle. Coarse reticular opacities at left lung base is stable and probably represent scarring or atelectasis. No focal consolidation. No pleural effusion or pneumothorax. Multilevel degenerative changes of the spine. IMPRESSION: Stable coarse reticular opacities at left lung base probably representing scarring or atelectasis. No focal consolidation. Electronically Signed   By: Kristine Garbe M.D.   On: 02/24/2017 16:43   Ir Patient Eval Tech 0-60 Mins  Result Date: 03/03/2017 Chipper Oman     03/03/2017 10:08 AM Evaluated clogged g tube at patients bedside.  The patient has a 20 fr pull thru g tube that family states has been getting repeatedly clogged over the last 2 weeks.  There was residual feedings in the tube and I was unable to initial flush the catheter.  I shortened the catheter approximately 2 inches.  With the hub off, I was successfully able to remove the residual clogged feeding with an enteral feeding tube declogger.  I replaced the hub and the tube flushed with ease.  I  advised the family to move the clamp on the tube to various locations so the tube does not continue to be clamp at the same spot. Also to flush more frequently if necessary.  I gave her the IR card for her to call if she has any further problems with the catheter as an outpatient.  Rn was informed the g tube was ready to use.  Subjective: Nonverbal, sister states he's at his baseline. No fevers. No change in secretions.  Discharge Exam: BP 134/66   Pulse 68   Temp 98 F (36.7 C) (Oral)   Resp 18   Ht 5' 6" (1.676 m)   Wt 62.8 kg (138 lb 7.2 oz)   SpO2 98%   BMI 22.35 kg/m   General: No distress Cardiovascular: RRR, no JVD or edema Respiratory: Clear and nonlabored Abdominal: Soft, NT, ND, bowel sounds +. PEG tube site without erythema or discharge or tenderness  Labs: BNP (last 3 results) Recent Labs    12/09/16 0351 03/02/17 1643  BNP 18.0 16.1   Basic Metabolic Panel: Recent Labs  Lab 03/03/17 0337 03/04/17 0504 03/05/17 0452 03/06/17 0424 03/07/17 0414  NA 141 140 138 140 136  K 4.1 4.0 3.8 4.8 4.2  CL 107 109 105 106 103  CO2 24  _0 GLUCOSE 80 112* 116* 100* 111*  BUN 25* _1 CREATININE 1.08 0.87 0.87 0.87 0.81  CALCIUM 10.0 9.4 9.8 9.9 9.9  MG 1.9  --   --   --   --   PHOS 3.7  --   --   --   --    Liver Function Tests: Recent Labs  Lab 03/02/17 1643 03/03/17 0337  AST 21 17  ALT 28 18  ALKPHOS 75 76  BILITOT 0.3 0.6  PROT 8.3* 8.1  ALBUMIN 2.8* 2.7*   CBC: Recent Labs  Lab 03/02/17 1643 03/03/17 0337 03/04/17 0504 03/04/17 1354 03/05/17 0452 03/06/17 0424 03/07/17 0414  WBC 13.7* 14.5* 15.1*  --  13.3* 12.4* 11.6*  NEUTROABS 10.0*  --   --   --   --   --   --   HGB 10.0* 10.3* 8.5* 8.4* 8.8* 8.9* 9.2*  HCT 31.5* 33.0* 27.5* 27.2* 28.5* 28.2* 29.2*  MCV 88.0 88.9 88.7  --  89.1 89.0 88.8  PLT 673* 639* 509*  --  587* 562* 573*   Cardiac Enzymes: Recent Labs  Lab 03/02/17 1952  TROPONINI <0.03   Anemia work  up No results for input(s): VITAMINB12, FOLATE, FERRITIN, TIBC, IRON, RETICCTPCT in the last 72 hours. Urinalysis    Component Value Date/Time   COLORURINE AMBER (A) 12/16/2016 1625   APPEARANCEUR CLEAR 12/16/2016 1625   LABSPEC 1.018 12/16/2016 1625   PHURINE 5.0 12/16/2016 1625   GLUCOSEU NEGATIVE 12/16/2016 1625   HGBUR NEGATIVE 12/16/2016 1625   BILIRUBINUR NEGATIVE 12/16/2016 1625   KETONESUR NEGATIVE 12/16/2016 1625   PROTEINUR 30 (A) 12/16/2016 1625   NITRITE NEGATIVE 12/16/2016 1625   LEUKOCYTESUR NEGATIVE 12/16/2016 1625    Microbiology Recent Results (from the past 240 hour(s))  Blood culture (routine x 2)     Status: None   Collection Time: 03/02/17  7:48 PM  Result Value Ref Range Status   Specimen Description BLOOD BLOOD RIGHT ARM UPPER  Final   Special Requests   Final    BOTTLES DRAWN AEROBIC AND ANAEROBIC Blood Culture adequate volume   Culture   Final    NO GROWTH 5 DAYS Performed at [facility], [place] [address], [place], [place] [address]    Report Status 03/07/2017 FINAL  Final  Culture, respiratory (NON-Expectorated)     Status: None   Collection Time: 03/02/17  8:46 PM  Result Value Ref Range Status   Specimen Description TRACHEAL ASPIRATE  Final   Special Requests NONE  Final   Gram Stain   Final    MODERATE WBC PRESENT, PREDOMINANTLY PMN FEW SQUAMOUS EPITHELIAL CELLS PRESENT RARE GRAM POSITIVE RODS Performed at [facility], [place] [address], [place],  [address]    Culture   Final    FEW ACINETOBACTER CALCOACETICUS/BAUMANNII COMPLEX MULTI-DRUG RESISTANT ORGANISM    Report Status 03/06/2017 FINAL  Final  MRSA PCR Screening     Status: Abnormal   Collection Time: 03/02/17 11:17 PM  Result Value Ref Range Status   MRSA by PCR POSITIVE (A) NEGATIVE Final    Comment:        The GeneXpert MRSA Assay (FDA approved for NASAL specimens only), is one component of a comprehensive MRSA colonization surveillance program. It is  not intended to diagnose MRSA infection nor to guide or monitor treatment for MRSA infections. RESULT CALLED TO, READ BACK BY AND VERIFIED WITH: G MBEMENA,RN _2 /17/18 MKELLY  Blood culture (routine x 2)     Status: None   Collection Time: 03/03/17  3:37 AM  Result Value Ref Range Status   Specimen Description LEFT ANTECUBITAL  Final   Special Requests IN PEDIATRIC BOTTLE Blood Culture adequate volume  Final   Culture   Final    NO GROWTH 5 DAYS Performed at [facility], [place] [address] [place], [place] [address]    Report Status 03/08/2017 FINAL  Final  Culture, respiratory (NON-Expectorated)     Status: None   Collection Time: 03/03/17  6:40 AM  Result Value Ref Range Status   Specimen Description TRACHEAL ASPIRATE  Final   Special Requests Immunocompromised  Final   Gram Stain   Final    ABUNDANT WBC PRESENT, PREDOMINANTLY PMN FEW SQUAMOUS EPITHELIAL CELLS PRESENT FEW GRAM POSITIVE RODS RARE GRAM NEGATIVE RODS RARE GRAM POSITIVE COCCI IN PAIRS    Culture   Final    MODERATE ACINETOBACTER CALCOACETICUS/BAUMANNII COMPLEX SUSCEPTIBILITIES PERFORMED ON PREVIOUS CULTURE WITHIN THE LAST 5 DAYS. MODERATE CORYNEBACTERIUM STRIATUM Standardized susceptibility testing for this organism is not available. Performed at [facility], [place] [address], [place],  [address]    Report Status 03/07/2017 FINAL  Final  Respiratory Panel by PCR     Status: None   Collection Time: 03/03/17  9:19 AM  Result Value Ref Range Status   Adenovirus NOT DETECTED NOT DETECTED Final   Coronavirus 229E NOT DETECTED NOT DETECTED Final   Coronavirus HKU1 NOT DETECTED NOT DETECTED Final   Coronavirus NL63 NOT DETECTED NOT DETECTED Final   Coronavirus OC43 NOT DETECTED NOT DETECTED Final   Metapneumovirus NOT DETECTED NOT DETECTED Final   Rhinovirus / Enterovirus NOT DETECTED NOT DETECTED Final   Influenza A NOT DETECTED NOT DETECTED Final   Influenza B NOT DETECTED NOT DETECTED Final    Parainfluenza Virus 1 NOT DETECTED NOT DETECTED Final   Parainfluenza Virus 2 NOT DETECTED NOT DETECTED Final   Parainfluenza Virus 3 NOT DETECTED NOT DETECTED Final   Parainfluenza Virus 4 NOT DETECTED NOT DETECTED Final   Respiratory Syncytial Virus NOT DETECTED NOT DETECTED Final   Bordetella pertussis NOT DETECTED NOT DETECTED Final   Chlamydophila pneumoniae NOT DETECTED NOT DETECTED Final   Mycoplasma pneumoniae NOT DETECTED NOT DETECTED Final    Time coordinating discharge: Approximately 40 minutes  Vance Gather, MD  [facility] 03/07/2017, 5:18 PM Pager [phone]

## 2017-03-08 LAB — CULTURE, BLOOD (ROUTINE X 2)
Culture: NO GROWTH
Special Requests: ADEQUATE

## 2017-03-14 DIAGNOSIS — E119 Type 2 diabetes mellitus without complications: Secondary | ICD-10-CM

## 2017-03-14 DIAGNOSIS — K219 Gastro-esophageal reflux disease without esophagitis: Secondary | ICD-10-CM | POA: Diagnosis present

## 2017-03-14 DIAGNOSIS — Z931 Gastrostomy status: Secondary | ICD-10-CM | POA: Insufficient documentation

## 2017-03-14 DIAGNOSIS — Z9889 Other specified postprocedural states: Secondary | ICD-10-CM

## 2017-03-30 ENCOUNTER — Other Ambulatory Visit: Payer: Self-pay

## 2017-03-30 ENCOUNTER — Inpatient Hospital Stay (HOSPITAL_COMMUNITY)
Admission: EM | Admit: 2017-03-30 | Discharge: 2017-04-04 | DRG: 871 | Disposition: A | Discharge status: Home Health | Payer: Medicare HMO | Attending: Family Medicine | Admitting: Family Medicine

## 2017-03-30 ENCOUNTER — Emergency Department (HOSPITAL_COMMUNITY): Payer: Medicare HMO

## 2017-03-30 ENCOUNTER — Encounter (HOSPITAL_COMMUNITY): Payer: Self-pay | Admitting: Emergency Medicine

## 2017-03-30 DIAGNOSIS — Z833 Family history of diabetes mellitus: Secondary | ICD-10-CM

## 2017-03-30 DIAGNOSIS — Z7902 Long term (current) use of antithrombotics/antiplatelets: Secondary | ICD-10-CM

## 2017-03-30 DIAGNOSIS — I10 Essential (primary) hypertension: Secondary | ICD-10-CM | POA: Diagnosis not present

## 2017-03-30 DIAGNOSIS — I129 Hypertensive chronic kidney disease with stage 1 through stage 4 chronic kidney disease, or unspecified chronic kidney disease: Secondary | ICD-10-CM | POA: Diagnosis present

## 2017-03-30 DIAGNOSIS — Z7982 Long term (current) use of aspirin: Secondary | ICD-10-CM | POA: Diagnosis not present

## 2017-03-30 DIAGNOSIS — Z7401 Bed confinement status: Secondary | ICD-10-CM

## 2017-03-30 DIAGNOSIS — I639 Cerebral infarction, unspecified: Secondary | ICD-10-CM | POA: Diagnosis present

## 2017-03-30 DIAGNOSIS — N183 Chronic kidney disease, stage 3 (moderate): Secondary | ICD-10-CM | POA: Diagnosis present

## 2017-03-30 DIAGNOSIS — Z9889 Other specified postprocedural states: Secondary | ICD-10-CM

## 2017-03-30 DIAGNOSIS — A419 Sepsis, unspecified organism: Secondary | ICD-10-CM | POA: Diagnosis present

## 2017-03-30 DIAGNOSIS — Z79899 Other long term (current) drug therapy: Secondary | ICD-10-CM

## 2017-03-30 DIAGNOSIS — N390 Urinary tract infection, site not specified: Secondary | ICD-10-CM | POA: Diagnosis present

## 2017-03-30 DIAGNOSIS — J189 Pneumonia, unspecified organism: Secondary | ICD-10-CM | POA: Diagnosis present

## 2017-03-30 DIAGNOSIS — Z960 Presence of urogenital implants: Secondary | ICD-10-CM | POA: Diagnosis not present

## 2017-03-30 DIAGNOSIS — E872 Acidosis: Secondary | ICD-10-CM | POA: Diagnosis present

## 2017-03-30 DIAGNOSIS — R059 Cough, unspecified: Secondary | ICD-10-CM

## 2017-03-30 DIAGNOSIS — K9420 Gastrostomy complication, unspecified: Secondary | ICD-10-CM | POA: Diagnosis present

## 2017-03-30 DIAGNOSIS — K9423 Gastrostomy malfunction: Secondary | ICD-10-CM

## 2017-03-30 DIAGNOSIS — E860 Dehydration: Secondary | ICD-10-CM | POA: Diagnosis present

## 2017-03-30 DIAGNOSIS — J209 Acute bronchitis, unspecified: Secondary | ICD-10-CM | POA: Diagnosis present

## 2017-03-30 DIAGNOSIS — Z87891 Personal history of nicotine dependence: Secondary | ICD-10-CM | POA: Diagnosis not present

## 2017-03-30 DIAGNOSIS — E875 Hyperkalemia: Secondary | ICD-10-CM | POA: Diagnosis present

## 2017-03-30 DIAGNOSIS — R05 Cough: Secondary | ICD-10-CM

## 2017-03-30 DIAGNOSIS — Z930 Tracheostomy status: Secondary | ICD-10-CM

## 2017-03-30 DIAGNOSIS — Z936 Other artificial openings of urinary tract status: Secondary | ICD-10-CM | POA: Diagnosis not present

## 2017-03-30 DIAGNOSIS — Z8673 Personal history of transient ischemic attack (TIA), and cerebral infarction without residual deficits: Secondary | ICD-10-CM

## 2017-03-30 DIAGNOSIS — D649 Anemia, unspecified: Secondary | ICD-10-CM | POA: Diagnosis present

## 2017-03-30 DIAGNOSIS — Z823 Family history of stroke: Secondary | ICD-10-CM | POA: Diagnosis not present

## 2017-03-30 DIAGNOSIS — Z931 Gastrostomy status: Secondary | ICD-10-CM

## 2017-03-30 DIAGNOSIS — Z978 Presence of other specified devices: Secondary | ICD-10-CM

## 2017-03-30 DIAGNOSIS — D631 Anemia in chronic kidney disease: Secondary | ICD-10-CM | POA: Diagnosis present

## 2017-03-30 DIAGNOSIS — Y95 Nosocomial condition: Secondary | ICD-10-CM | POA: Diagnosis present

## 2017-03-30 DIAGNOSIS — J9601 Acute respiratory failure with hypoxia: Secondary | ICD-10-CM | POA: Diagnosis present

## 2017-03-30 DIAGNOSIS — J40 Bronchitis, not specified as acute or chronic: Secondary | ICD-10-CM | POA: Diagnosis not present

## 2017-03-30 DIAGNOSIS — T83511D Infection and inflammatory reaction due to indwelling urethral catheter, subsequent encounter: Secondary | ICD-10-CM | POA: Diagnosis not present

## 2017-03-30 DIAGNOSIS — J9621 Acute and chronic respiratory failure with hypoxia: Secondary | ICD-10-CM | POA: Diagnosis not present

## 2017-03-30 HISTORY — DX: Urinary tract infection, site not specified: N39.0

## 2017-03-30 HISTORY — DX: Gastrostomy status: Z93.1

## 2017-03-30 LAB — COMPREHENSIVE METABOLIC PANEL
ALT: 22 U/L (ref 17–63)
AST: 34 U/L (ref 15–41)
Albumin: 3.2 g/dL — ABNORMAL LOW (ref 3.5–5.0)
Alkaline Phosphatase: 100 U/L (ref 38–126)
Anion gap: 17 — ABNORMAL HIGH (ref 5–15)
BUN: 28 mg/dL — ABNORMAL HIGH (ref 6–20)
CO2: 20 mmol/L — ABNORMAL LOW (ref 22–32)
Calcium: 11 mg/dL — ABNORMAL HIGH (ref 8.9–10.3)
Chloride: 107 mmol/L (ref 101–111)
Creatinine, Ser: 1.15 mg/dL (ref 0.61–1.24)
GFR calc Af Amer: >60 mL/min (ref >60)
GFR calc non Af Amer: >60 mL/min (ref >60)
Glucose, Bld: 83 mg/dL (ref 65–99)
Potassium: 5.8 mmol/L — ABNORMAL HIGH (ref 3.5–5.1)
Sodium: 144 mmol/L (ref 135–145)
Total Bilirubin: 0.8 mg/dL (ref 0.3–1.2)
Total Protein: 9 g/dL — ABNORMAL HIGH (ref 6.5–8.1)

## 2017-03-30 LAB — URINALYSIS, ROUTINE W REFLEX MICROSCOPIC
Bilirubin Urine: NEGATIVE
Glucose, UA: NEGATIVE mg/dL
Hgb urine dipstick: NEGATIVE
Ketones, ur: NEGATIVE mg/dL
Nitrite: NEGATIVE
Protein, ur: 100 mg/dL — AB
Specific Gravity, Urine: 1.017 (ref 1.005–1.030)
Squamous Epithelial / LPF: NONE SEEN
pH: 5 (ref 5.0–8.0)

## 2017-03-30 LAB — BASIC METABOLIC PANEL
Anion gap: 9 (ref 5–15)
BUN: 22 mg/dL — ABNORMAL HIGH (ref 6–20)
CO2: 21 mmol/L — ABNORMAL LOW (ref 22–32)
Calcium: 10.7 mg/dL — ABNORMAL HIGH (ref 8.9–10.3)
Chloride: 108 mmol/L (ref 101–111)
Creatinine, Ser: 0.96 mg/dL (ref 0.61–1.24)
GFR calc Af Amer: >60 mL/min (ref >60)
GFR calc non Af Amer: >60 mL/min (ref >60)
Glucose, Bld: 87 mg/dL (ref 65–99)
Potassium: 4 mmol/L (ref 3.5–5.1)
Sodium: 138 mmol/L (ref 135–145)

## 2017-03-30 LAB — BRAIN NATRIURETIC PEPTIDE: B Natriuretic Peptide: 18 pg/mL (ref 0.0–100.0)

## 2017-03-30 LAB — CBC WITH DIFFERENTIAL/PLATELET
Basophils Absolute: 0 10*3/uL (ref 0.0–0.1)
Basophils Relative: 0 %
Eosinophils Absolute: 0.5 10*3/uL (ref 0.0–0.7)
Eosinophils Relative: 3 %
HCT: 37.5 % — ABNORMAL LOW (ref 39.0–52.0)
Hemoglobin: 12 g/dL — ABNORMAL LOW (ref 13.0–17.0)
Lymphocytes Relative: 14 %
Lymphs Abs: 2.4 10*3/uL (ref 0.7–4.0)
MCH: 28.2 pg (ref 26.0–34.0)
MCHC: 32 g/dL (ref 30.0–36.0)
MCV: 88.2 fL (ref 78.0–100.0)
Monocytes Absolute: 2.4 10*3/uL — ABNORMAL HIGH (ref 0.1–1.0)
Monocytes Relative: 14 %
Neutro Abs: 11.5 10*3/uL — ABNORMAL HIGH (ref 1.7–7.7)
Neutrophils Relative %: 69 %
Platelets: 681 10*3/uL — ABNORMAL HIGH (ref 150–400)
RBC: 4.25 MIL/uL (ref 4.22–5.81)
RDW: 16.3 % — ABNORMAL HIGH (ref 11.5–15.5)
WBC: 16.8 10*3/uL — ABNORMAL HIGH (ref 4.0–10.5)

## 2017-03-30 LAB — CK: Total CK: 98 U/L (ref 49–397)

## 2017-03-30 LAB — CBG MONITORING, ED: Glucose-Capillary: 100 mg/dL — ABNORMAL HIGH (ref 65–99)

## 2017-03-30 LAB — PROTIME-INR
INR: 1.17
Prothrombin Time: 14.8 seconds (ref 11.4–15.2)

## 2017-03-30 LAB — LIPASE, BLOOD: Lipase: 23 U/L (ref 11–51)

## 2017-03-30 LAB — I-STAT CG4 LACTIC ACID, ED: Lactic Acid, Venous: 1.71 mmol/L (ref 0.5–1.9)

## 2017-03-30 MED ORDER — METOPROLOL TARTRATE 25 MG PO TABS
25.0000 mg | ORAL_TABLET | Freq: Two times a day (BID) | ORAL | Status: DC
  Administered 2017-03-30 – 2017-04-04 (×8): 25 mg
  Filled 2017-03-30 (×9): qty 1

## 2017-03-30 MED ORDER — IPRATROPIUM-ALBUTEROL 0.5-2.5 (3) MG/3ML IN SOLN
3.0000 mL | Freq: Four times a day (QID) | RESPIRATORY_TRACT | Status: DC
  Administered 2017-03-30: 3 mL via RESPIRATORY_TRACT
  Filled 2017-03-30: qty 3

## 2017-03-30 MED ORDER — SODIUM CHLORIDE 0.9 % IV BOLUS (SEPSIS)
1000.0000 mL | Freq: Once | INTRAVENOUS | Status: AC
  Administered 2017-03-30: 1000 mL via INTRAVENOUS

## 2017-03-30 MED ORDER — DEXTROSE 5 % IV SOLN
1.0000 g | Freq: Three times a day (TID) | INTRAVENOUS | Status: DC
  Administered 2017-03-30 – 2017-04-03 (×11): 1 g via INTRAVENOUS
  Filled 2017-03-30 (×14): qty 1

## 2017-03-30 MED ORDER — CALCIUM CHLORIDE 10 % IV SOLN
1.0000 g | Freq: Once | INTRAVENOUS | Status: AC
  Administered 2017-03-30: 1 g via INTRAVENOUS
  Filled 2017-03-30: qty 10

## 2017-03-30 MED ORDER — SODIUM POLYSTYRENE SULFONATE 15 GM/60ML PO SUSP
15.0000 g | Freq: Once | ORAL | Status: AC
  Administered 2017-03-30: 15 g
  Filled 2017-03-30: qty 60

## 2017-03-30 MED ORDER — PRAVASTATIN SODIUM 20 MG PO TABS
20.0000 mg | ORAL_TABLET | Freq: Every day | ORAL | Status: DC
  Administered 2017-03-30 – 2017-04-03 (×4): 20 mg
  Filled 2017-03-30 (×4): qty 1

## 2017-03-30 MED ORDER — AMLODIPINE BESYLATE 10 MG PO TABS
10.0000 mg | ORAL_TABLET | Freq: Every day | ORAL | Status: DC
  Administered 2017-03-30 – 2017-04-04 (×5): 10 mg
  Filled 2017-03-30 (×5): qty 1
  Filled 2017-03-30: qty 2

## 2017-03-30 MED ORDER — SODIUM CHLORIDE 0.9 % IV SOLN
INTRAVENOUS | Status: DC
  Administered 2017-03-30 – 2017-03-31 (×2): via INTRAVENOUS

## 2017-03-30 MED ORDER — ASPIRIN 81 MG PO CHEW
81.0000 mg | CHEWABLE_TABLET | Freq: Every day | ORAL | Status: DC
  Administered 2017-03-31: 81 mg via ORAL
  Filled 2017-03-30 (×2): qty 1

## 2017-03-30 MED ORDER — IPRATROPIUM-ALBUTEROL 0.5-2.5 (3) MG/3ML IN SOLN
3.0000 mL | Freq: Three times a day (TID) | RESPIRATORY_TRACT | Status: DC
Start: 2017-03-30 — End: 2017-04-04
  Administered 2017-03-30 – 2017-04-04 (×13): 3 mL via RESPIRATORY_TRACT
  Filled 2017-03-30 (×14): qty 3

## 2017-03-30 MED ORDER — ACETAMINOPHEN 500 MG PO TABS
1000.0000 mg | ORAL_TABLET | Freq: Three times a day (TID) | ORAL | Status: DC
  Administered 2017-03-30 – 2017-04-04 (×11): 1000 mg
  Filled 2017-03-30 (×12): qty 2

## 2017-03-30 MED ORDER — CLOPIDOGREL BISULFATE 75 MG PO TABS
75.0000 mg | ORAL_TABLET | Freq: Every day | ORAL | Status: DC
  Administered 2017-03-30 – 2017-04-04 (×5): 75 mg
  Filled 2017-03-30 (×6): qty 1

## 2017-03-30 MED ORDER — ORAL CARE MOUTH RINSE
15.0000 mL | Freq: Four times a day (QID) | OROMUCOSAL | Status: DC
  Administered 2017-03-31 – 2017-04-04 (×17): 15 mL via OROMUCOSAL

## 2017-03-30 MED ORDER — VITAMIN B-1 100 MG PO TABS
100.0000 mg | ORAL_TABLET | Freq: Every day | ORAL | Status: DC
  Administered 2017-03-30 – 2017-04-04 (×5): 100 mg
  Filled 2017-03-30 (×6): qty 1

## 2017-03-30 MED ORDER — DEXTROSE 5 % IV SOLN
2.0000 g | Freq: Once | INTRAVENOUS | Status: AC
  Administered 2017-03-30: 2 g via INTRAVENOUS
  Filled 2017-03-30: qty 2

## 2017-03-30 MED ORDER — ALBUTEROL SULFATE (2.5 MG/3ML) 0.083% IN NEBU: 2.5000 mg | INHALATION_SOLUTION | RESPIRATORY_TRACT | Status: DC | PRN

## 2017-03-30 MED ORDER — GABAPENTIN 100 MG PO CAPS
100.0000 mg | ORAL_CAPSULE | Freq: Three times a day (TID) | ORAL | Status: DC
  Administered 2017-03-30 – 2017-04-04 (×11): 100 mg via ORAL
  Filled 2017-03-30 (×12): qty 1

## 2017-03-30 MED ORDER — FAMOTIDINE 20 MG PO TABS
40.0000 mg | ORAL_TABLET | Freq: Every day | ORAL | Status: DC
  Administered 2017-03-30 – 2017-04-04 (×5): 40 mg
  Filled 2017-03-30 (×6): qty 2

## 2017-03-30 MED ORDER — FREE WATER
200.0000 mL | Status: DC
  Administered 2017-03-30 – 2017-03-31 (×6): 200 mL

## 2017-03-30 MED ORDER — CLONIDINE HCL 0.1 MG PO TABS
0.1000 mg | ORAL_TABLET | Freq: Three times a day (TID) | ORAL | Status: DC
  Administered 2017-03-30 – 2017-04-03 (×8): 0.1 mg
  Filled 2017-03-30 (×9): qty 1

## 2017-03-30 MED ORDER — JEVITY 1.2 CAL PO LIQD: 1000.0000 mL | ORAL | Status: DC

## 2017-03-30 MED ORDER — LORAZEPAM 2 MG/ML IJ SOLN
1.0000 mg | Freq: Four times a day (QID) | INTRAMUSCULAR | Status: DC | PRN
  Filled 2017-03-30: qty 1

## 2017-03-30 MED ORDER — VANCOMYCIN HCL 500 MG IV SOLR
500.0000 mg | Freq: Two times a day (BID) | INTRAVENOUS | Status: DC
  Administered 2017-03-31: 500 mg via INTRAVENOUS
  Filled 2017-03-30 (×3): qty 500

## 2017-03-30 MED ORDER — JEVITY 1.2 CAL PO LIQD
1000.0000 mL | ORAL | Status: DC
  Administered 2017-03-30: 1000 mL
  Filled 2017-03-30 (×3): qty 1000

## 2017-03-30 MED ORDER — VANCOMYCIN HCL IN DEXTROSE 1-5 GM/200ML-% IV SOLN
1000.0000 mg | Freq: Once | INTRAVENOUS | Status: AC
  Administered 2017-03-30: 1000 mg via INTRAVENOUS
  Filled 2017-03-30: qty 200

## 2017-03-30 NOTE — Progress Notes (Signed)
RT called to room for Pt in from home with long-term trach c/o sob. Pt has #4 cuffless shiley. Pt suctioned #40F with small amount of clear thin secretions obtained. Pt does not appear to be in distress at this time, no increased WOB, No SOB, VS within normal limits. Pt placed on 10L/40% ATC at this time

## 2017-03-30 NOTE — Progress Notes (Signed)
RN came to this RT about pt spo2 85%. RT to pt's bedside and pt had pulled TC off. TC re-applied and tightened and spo2 improved to 94%. No changes made at this time. Pt with moderate to copious amounts of white/pink tinged thick frothy secretions with strong cough.

## 2017-03-30 NOTE — ED Notes (Signed)
Patient suctioned without difficulty, patient tolerated well

## 2017-03-30 NOTE — Progress Notes (Signed)
RT attempting to clean trach, obtain sputum sample for lab (per MD order) and give HHN tx. Pt swinging at this RT and making motion of biting me. RT placed gauze under trach due to pt coughing up moderate amount of frothy/thin clear/white/pink tinged sputum. Pt attempting to take trach collar and gauze off. RN made aware of need for "mits"to protect pt and for his safety so he does not pull on trach. RT also turned trach collar down from 15L/100% to 10L/60%. VS within normal limits. RT will continue to closely monitor pt.

## 2017-03-30 NOTE — H&P (Signed)
Patient Demographics:    Scott Rivera, is a 66 y.o. male  MRN: [id number]   DOB - September 21, 1950  Admit Date - 03/30/2017  Outpatient Primary MD for the patient is Patient, No Pcp Per   Assessment & Plan:    Principal Problem:   Bronchitis/??? Pneumonia Active Problems:   Cerebellar infarct ([place])   Essential hypertension   H/O tracheostomy   S/P percutaneous endoscopic gastrostomy (PEG) tube placement (HCC)   Chronic anemia   Foley catheter in place on admission   UTI (urinary tract infection)    1)Acute on chronic hypoxic respiratory failure secondary to Tracheobronchitis with increased sputum production,  increased oxygen requirement and possible pneumonia-continue IV vancomycin and cefepime was started by ED provider pending sputum and blood cultures, respiratory therapist input appreciated, leukocytosis noted.  Continue supplemental oxygen through tracheostomy, bronchodilators as ordered.  Cannot rule out HCAP or aspiration Pneumonia  2)Social/Ethics-plan of care, advanced directives discussed with patient and his sister at bedside, patient is a full code  3)Possible UTI-patient has a chronic indwelling Foley catheter, IV vancomycin and cefepime as above #1 pending urine and blood cultures  4)FEN-okay to resume tube feedings through PEG tube with Jevity 1.2 at 50 mL an hour, with water flushes, get nutritional consult  5)HYperkalemia-currently patient renal functions better creatinine is down to 1.1, however potassium is 5.8, ordered Kayexalate and calcium chloride repeat potassium/BMP for later this evening  With History of - Reviewed by me  Past Medical History:  Diagnosis Date  . Anemia due to chronic kidney disease   . Aspiration pneumonia ([place])   . Chronic kidney disease, stage 3 (moderate) (HCC)     . Hypertension   . Stroke [facility])       Past Surgical History:  Procedure Laterality Date  . IR GASTROSTOMY TUBE MOD SED  10/27/2016  . IR PATIENT EVAL TECH 0-60 MINS  01/05/2017  . IR PATIENT EVAL TECH 0-60 MINS  01/09/2017  . IR PATIENT EVAL TECH 0-60 MINS  03/03/2017  . KNEE SURGERY    . TRACHEOSTOMY TUBE PLACEMENT        Chief Complaint  Patient presents with  . Altered Mental Status  . Respiratory Distress      HPI:    Scott Rivera  is a 66 y.o. male  medical history significant of CVA,hypertension, c cerebellar infarction on 10/15/2016 with result need for tracheostomy and PEG tube placement, h/o recurrent aspiration Pneumonia.Chronic loculated pleural effusion and  history of pressure ulcers who presents with concerns about respiratory distress with increased sputum production over the last couple of days.  No vomiting or diarrhea patient has been tolerating his tube feeding well.  Most of the history is obtained from patient's sister at bedside as patient is nonverbal at this time  At the time of my evaluation patient is coughing intermittently with yellowish secretions from his tracheostomy site.   In ED patient is found to have leukocytosis,  low-grade temp 100.3, tachycardia increasing hypoxia and respiratory distress.  UA is strongly suggestive of UTI    Review of systems:    In addition to the HPI above,   A full 12 point Review of 10 Systems was done, except as stated above, all other Review of 10 Systems were negative.    Social History:  Reviewed by me    Social History   Tobacco Use  . Smoking status: Former Research scientist (life sciences)  . Smokeless tobacco: Never Used  . Tobacco comment: Smoked heavily until the day of his stroke  Substance Use Topics  . 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 Diabetes Mellitus II Brother   . CAD Neg Hx    . Stroke Neg Hx       Home Medications:   Prior to Admission medications   Medication Sig Start Date End Date Taking? Authorizing Provider  acetaminophen (TYLENOL) 500 MG tablet Place 1,000 mg 3 (three) times daily into feeding tube.     [provider]  amLODipine (NORVASC) 10 MG tablet Place 1 tablet (10 mg total) into feeding tube daily. 01/11/17   Cherene Altes, MD  aspirin 325 MG tablet Place 1 tablet (325 mg total) into feeding tube daily. 01/11/17   Cherene Altes, MD  cloNIDine (CATAPRES) 0.1 MG tablet Place 1 tablet (0.1 mg total) into feeding tube 3 (three) times daily. 01/10/17   Cherene Altes, MD  clopidogrel (PLAVIX) 75 MG tablet Place 1 tablet (75 mg total) into feeding tube daily. 01/11/17   Cherene Altes, MD  famotidine (PEPCID) 40 MG tablet Place 40 mg daily into feeding tube.    [provider]  gabapentin (NEURONTIN) 100 MG capsule Take 100 mg See admin instructions by mouth. Open 1 capsule (100 mg) and administer contents via feeding tube three times daily    [provider]  ipratropium-albuterol (DUONEB) 0.5-2.5 (3) MG/3ML SOLN Take 3 mLs by nebulization every 4 (four) hours as needed. Patient taking differently: Take 3 mLs every 4 (four) hours as needed by nebulization (shortness of breath/wheezing).  01/10/17   Cherene Altes, MD  metoprolol tartrate (LOPRESSOR) 25 MG tablet Place 25 mg 2 (two) times daily into feeding tube.     [provider]  mouth rinse LIQD solution 15 mLs by Mouth Rinse route QID. Patient taking differently: 15 mLs by Mouth Rinse route 2 (two) times daily.  01/10/17   Cherene Altes, MD  Nutritional Supplements (FEEDING SUPPLEMENT, JEVITY 1.2 CAL,) LIQD Place 1,000 mLs into feeding tube continuous. Patient taking differently: Place 1,000 mLs continuous into feeding tube. 60 ml/hr 01/10/17   Cherene Altes, MD  pravastatin (PRAVACHOL) 20 MG tablet Place 1 tablet (20 mg total) into feeding  tube daily at 6 PM. Patient taking differently: Place 20 mg daily into feeding tube.  01/10/17   Cherene Altes, MD  thiamine 100 MG tablet Place 1 tablet (100 mg total) into feeding tube daily. 01/11/17   Cherene Altes, MD  Water For Irrigation, Sterile (FREE WATER) SOLN Place 100 mLs into feeding tube every 3 (three) hours. Patient taking differently: Place 4 (four) times daily into feeding tube. Per sister - 4 syringes 4 times daily - unknown quantity - maybe 60 mls per syringe 01/10/17   McClung,  Kimberlee Nearing, MD     Allergies:    No Known Allergies   Physical Exam:   Vitals  Blood pressure (!) 155/83, pulse (!) 118, temperature 100.3 F (37.9 C), temperature source Rectal, resp. rate 11, weight 62.6 kg (138 lb), SpO2 90 %.  Physical Examination: General appearance -chronically ill-appearing with some respiratory distress  mental status -lethargic Eyes - sclera anicteric Neck -tracheostomy with increased secretions, trach collar/oxyegn Chest -diminished breath sounds in bases, scattered rhonchi and wheezes bilaterally Heart - S1 and S2 normal, cardiac Abdomen - soft, nontender, nondistended, PEG tube site is clean dry and intact Neurological -neuromuscular deficits and contractures from previous stroke according to Sister this is patient's new baseline Skin - warm, dry GU-Foley with cloudy urine   Data Review:    CBC Recent Labs  Lab 03/30/17 1302  WBC 16.8*  HGB 12.0*  HCT 37.5*  PLT 681*  MCV 88.2  MCH 28.2  MCHC 32.0  RDW 16.3*  LYMPHSABS 2.4  MONOABS 2.4*  EOSABS 0.5  BASOSABS 0.0   ------------------------------------------------------------------------------------------------------------------  Chemistries  Recent Labs  Lab 03/30/17 1302  NA 144  K 5.8*  CL 107  CO2 20*  GLUCOSE 83  BUN 28*  CREATININE 1.15  CALCIUM 11.0*  AST 34  ALT 22  ALKPHOS 100  BILITOT 0.8    ------------------------------------------------------------------------------------------------------------------ estimated creatinine clearance is 55.9 mL/min (by C-G formula based on SCr of 1.15 mg/dL). ------------------------------------------------------------------------------------------------------------------ No results for input(s): TSH, T4TOTAL, T3FREE, THYROIDAB in the last 72 hours.  Invalid input(s): FREET3   Coagulation profile Recent Labs  Lab 03/30/17 1302  INR 1.17   ------------------------------------------------------------------------------------------------------------------- No results for input(s): DDIMER in the last 72 hours. -------------------------------------------------------------------------------------------------------------------  Cardiac Enzymes No results for input(s): CKMB, TROPONINI, MYOGLOBIN in the last 168 hours.  Invalid input(s): CK ------------------------------------------------------------------------------------------------------------------    Component Value Date/Time   BNP 18.0 03/30/2017 1302     ---------------------------------------------------------------------------------------------------------------  Urinalysis    Component Value Date/Time   COLORURINE YELLOW 03/30/2017 1209   APPEARANCEUR CLOUDY (A) 03/30/2017 1209   LABSPEC 1.017 03/30/2017 1209   PHURINE 5.0 03/30/2017 1209   GLUCOSEU NEGATIVE 03/30/2017 1209   HGBUR NEGATIVE 03/30/2017 1209   BILIRUBINUR NEGATIVE 03/30/2017 1209   KETONESUR NEGATIVE 03/30/2017 1209   PROTEINUR 100 (A) 03/30/2017 1209   NITRITE NEGATIVE 03/30/2017 1209   LEUKOCYTESUR LARGE (A) 03/30/2017 1209    ----------------------------------------------------------------------------------------------------------------   Imaging Results:    Ct Head Wo Contrast  Result Date: 03/30/2017 CLINICAL DATA:  Altered level of consciousness EXAM: CT HEAD WITHOUT CONTRAST TECHNIQUE:  Contiguous axial images were obtained from the base of the skull through the vertex without intravenous contrast. COMPARISON:  10/20/2016 FINDINGS: Brain: No evidence of acute infarction, hemorrhage, extra-axial collection, ventriculomegaly, or mass effect. Old large left cerebellar infarct with encephalomalacia. Old right cerebellar infarct with encephalomalacia. Left frontoparietal chronic infarct with encephalomalacia. Old left thalamic lacunar infarct. Generalized cerebral atrophy. Periventricular white matter low attenuation likely secondary to microangiopathy. Vascular: Cerebrovascular atherosclerotic calcifications are noted. Skull: Negative for fracture or focal lesion. Sinuses/Orbits: Visualized portions of the orbits are unremarkable. Visualized portions of the paranasal sinuses and mastoid air cells are unremarkable. Other: None. IMPRESSION: 1. No acute intracranial pathology. Electronically Signed   By: Kathreen Devoid   On: 03/30/2017 12:10   Dg Chest Portable 1 View  Result Date: 03/30/2017 CLINICAL DATA:  Cough and increasing tracheal secretions EXAM: PORTABLE CHEST 1 VIEW COMPARISON:  Chest radiograph March 02, 2017 and chest CT March 02, 2017 FINDINGS:  Tracheostomy catheter tip is 5.2 cm above the carina. There is currently no edema or consolidation. Heart is upper normal in size with pulmonary vascularity within normal limits. No adenopathy. There is degenerative change in each shoulder. 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 IMPRESSION: Tracheostomy as described without pneumothorax. No edema or consolidation. Cardiac silhouette within normal limits. 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 Electronically Signed   By: Lowella Grip III M.D.   On: 03/30/2017 11:49    Radiological Exams on Admission: Ct Head Wo Contrast  Result Date: 03/30/2017 CLINICAL DATA:  Altered level of consciousness EXAM: CT HEAD WITHOUT CONTRAST TECHNIQUE: Contiguous axial  images were obtained from the base of the skull through the vertex without intravenous contrast. COMPARISON:  10/20/2016 FINDINGS: Brain: No evidence of acute infarction, hemorrhage, extra-axial collection, ventriculomegaly, or mass effect. Old large left cerebellar infarct with encephalomalacia. Old right cerebellar infarct with encephalomalacia. Left frontoparietal chronic infarct with encephalomalacia. Old left thalamic lacunar infarct. Generalized cerebral atrophy. Periventricular white matter low attenuation likely secondary to microangiopathy. Vascular: Cerebrovascular atherosclerotic calcifications are noted. Skull: Negative for fracture or focal lesion. Sinuses/Orbits: Visualized portions of the orbits are unremarkable. Visualized portions of the paranasal sinuses and mastoid air cells are unremarkable. Other: None. IMPRESSION: 1. No acute intracranial pathology. Electronically Signed   By: Kathreen Devoid   On: 03/30/2017 12:10   Dg Chest Portable 1 View  Result Date: 03/30/2017 CLINICAL DATA:  Cough and increasing tracheal secretions EXAM: PORTABLE CHEST 1 VIEW COMPARISON:  Chest radiograph March 02, 2017 and chest CT March 02, 2017 FINDINGS: Tracheostomy catheter tip is 5.2 cm above the carina. There is currently no edema or consolidation. Heart is upper normal in size with pulmonary vascularity within normal limits. No adenopathy. There is degenerative change in each shoulder. 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 IMPRESSION: Tracheostomy as described without pneumothorax. No edema or consolidation. Cardiac silhouette within normal limits. 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 Electronically Signed   By: Lowella Grip III M.D.   On: 03/30/2017 11:49    DVT Prophylaxis -SCD AM Labs Ordered, also please review Full Orders  Family Communication: Admission, patients condition and plan of care including tests being ordered have been discussed with the  patient and sister who indicate understanding and agree with the plan   Code Status - Full Code  Likely DC to  Home Vs SNF  Condition   fair  Roxan Hockey M.D on 03/30/2017 at 6:47 PM   Between 7am to 7pm - Pager - [phone] After 7pm go to [email or web address] - password [email or web address]  [facility] - Office  [phone]  Voice Recognition Viviann Spare dictation system was used to create this note, attempts have been made to correct errors. Please contact the author with questions and/or clarifications.

## 2017-03-30 NOTE — ED Notes (Signed)
Respiratory at bedside.

## 2017-03-30 NOTE — ED Notes (Signed)
Admitting at bedside 

## 2017-03-30 NOTE — Progress Notes (Signed)
Pharmacy Antibiotic Note   Scott Rivera is a 66 y.o. male admitted on 03/30/2017 with pneumonia.  Pharmacy has been consulted for cefepime and vancomycin dosing.  Given one time doses in the ED. Tmax of 100.3, WBC 16.8. CKD stage 3. SCr CrCl ~21ml/min.  Plan: Start cefepime 1g IV Q8h Start vancomycin 500mg  IV Q12h Monitor clinical picture, renal function, VT prn F/U C&S, abx deescalation / LOT  Weight: 138 lb (62.6 kg)  Temp (24hrs), Avg:100.3 F (37.9 C), Min:100.3 F (37.9 C), Max:100.3 F (37.9 C)  Recent Labs  Lab 03/30/17 1150 03/30/17 1302  WBC  --  16.8*  LATICACIDVEN 1.71  --     CrCl cannot be calculated (Patient's most recent lab result is older than the maximum 21 days allowed.).    No Known Allergies  Thank you for allowing pharmacy to be a part of this patient's care.  Elenor Quinones, PharmD, BCPS Clinical Pharmacist Pager [phone] 03/30/2017 3:00 PM

## 2017-03-30 NOTE — ED Notes (Signed)
Patient becoming more anxious and pulling at Trach tube and IV lines.  Admitting paged about patient condition.  Patient currently became hypoxic at 88%.  Lung fields diminished throughout.  Per report from previous RN patient oxygen saturations have been sitting in the high 80s to low 90s.  RT aware of situation.

## 2017-03-30 NOTE — ED Triage Notes (Signed)
To ED via [facility] from home with family c/o increased mucus production, needing suctioning more, and decreased level of consciousness.  Pt has 4.0 uncuffed shiley, G-tube in place, foley in place.

## 2017-03-30 NOTE — ED Notes (Signed)
Family leaving to go home at this time

## 2017-03-30 NOTE — ED Provider Notes (Signed)
[facility] EMERGENCY DEPARTMENT Provider Note   CSN: [id number] Arrival date & time: 03/30/17  1113     History   Chief Complaint Chief Complaint  Patient presents with  . Altered Mental Status  . Respiratory Distress    HPI Scott Rivera is a 66 y.o. male.  The history is provided by the EMS personnel and medical records. The history is limited by the condition of the patient.  Cough  This is a new problem. The current episode started 2 days ago. The problem occurs constantly. The problem has been gradually worsening. The cough is productive of sputum. The maximum temperature recorded prior to his arrival was 100 to 100.9 F. The fever has been present for 1 to 2 days. Associated symptoms include chills and shortness of breath. Pertinent negatives include no chest pain. He has tried nothing for the symptoms. His past medical history is significant for pneumonia.  Altered Mental Status   This is a new problem. The current episode started 2 days ago. The problem has been gradually worsening. Associated symptoms include confusion. His past medical history is significant for hypertension.    Past Medical History:  Diagnosis Date  . Anemia due to chronic kidney disease   . Aspiration pneumonia ([place])   . Chronic kidney disease, stage 3 (moderate) (HCC)   . Hypertension   . Stroke [facility])     Patient Active Problem List   Diagnosis Date Noted  . MDR Acinetobacter baumannii infection   . 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 PEG tube malfunction ([place])   . Acute respiratory failure with hypoxia ([place]) 03/02/2017  . Loculated pleural effusion   . Acute respiratory distress   . Fever   . Follow up   . HCAP (healthcare-associated pneumonia)   . Stage III pressure ulcer of sacral region ([place]) 12/17/2016  . Hypoxemia   . Tracheostomy status ([place])   . Pneumothorax   . Pneumothorax, closed, traumatic, initial encounter 12/10/2016  . Sepsis ([place]) 12/09/2016  .  Hypotension 12/09/2016  . Acute-on-chronic kidney injury ([place]) 12/09/2016  . Elevated troponin 12/09/2016  . Anemia due to chronic kidney disease 12/09/2016  . Compensated metabolic alkalosis 38/75/6433  . Aspiration pneumonia ([place])   . Goals of care, counseling/discussion   . Palliative care by specialist   . Respiratory failure ([place])   . Aspiration into airway   . Septic shock ([place])   . Dysphagia, post-stroke   . Benign essential HTN   . Tobacco abuse   . Tachypnea   . Hyperglycemia   . Hypernatremia   . Leukocytosis (leucocytosis)   . Acute blood loss anemia   . Chronic kidney disease   . Pressure injury of skin 10/20/2016  . Cerebellar infarct ([place]) 10/19/2016  . ARF (acute renal failure) ([place]) 10/19/2016  . Essential hypertension 10/19/2016  . Rhabdomyolysis 10/19/2016  . Acute ischemic stroke ([place]) 10/19/2016  . Bilateral chronic knee pain 01/07/2016  . History of adenomatous polyp of colon 07/28/2015  . Hyperlipidemia 07/28/2013  . Helicobacter pylori infection 09/21/2012    Past Surgical History:  Procedure Laterality Date  . IR GASTROSTOMY TUBE MOD SED  10/27/2016  . IR PATIENT EVAL TECH 0-60 MINS  01/05/2017  . IR PATIENT EVAL TECH 0-60 MINS  01/09/2017  . IR PATIENT EVAL TECH 0-60 MINS  03/03/2017  . KNEE SURGERY    . TRACHEOSTOMY TUBE PLACEMENT         Home Medications    Prior to  Admission medications   Medication Sig Start Date End Date Taking? Authorizing Provider  acetaminophen (TYLENOL) 500 MG tablet Place 1,000 mg 3 (three) times daily into feeding tube.     [provider]  amLODipine (NORVASC) 10 MG tablet Place 1 tablet (10 mg total) into feeding tube daily. 01/11/17   Cherene Altes, MD  aspirin 325 MG tablet Place 1 tablet (325 mg total) into feeding tube daily. 01/11/17   Cherene Altes, MD  cloNIDine (CATAPRES) 0.1 MG tablet Place 1 tablet (0.1 mg total) into feeding tube 3 (three) times daily. 01/10/17   Cherene Altes, MD    clopidogrel (PLAVIX) 75 MG tablet Place 1 tablet (75 mg total) into feeding tube daily. 01/11/17   Cherene Altes, MD  famotidine (PEPCID) 40 MG tablet Place 40 mg daily into feeding tube.    [provider]  gabapentin (NEURONTIN) 100 MG capsule Take 100 mg See admin instructions by mouth. Open 1 capsule (100 mg) and administer contents via feeding tube three times daily    [provider]  ipratropium-albuterol (DUONEB) 0.5-2.5 (3) MG/3ML SOLN Take 3 mLs by nebulization every 4 (four) hours as needed. Patient taking differently: Take 3 mLs every 4 (four) hours as needed by nebulization (shortness of breath/wheezing).  01/10/17   Cherene Altes, MD  metoprolol tartrate (LOPRESSOR) 25 MG tablet Place 25 mg 2 (two) times daily into feeding tube.     [provider]  mouth rinse LIQD solution 15 mLs by Mouth Rinse route QID. Patient taking differently: 15 mLs by Mouth Rinse route 2 (two) times daily.  01/10/17   Cherene Altes, MD  Nutritional Supplements (FEEDING SUPPLEMENT, JEVITY 1.2 CAL,) LIQD Place 1,000 mLs into feeding tube continuous. Patient taking differently: Place 1,000 mLs continuous into feeding tube. 60 ml/hr 01/10/17   Cherene Altes, MD  pravastatin (PRAVACHOL) 20 MG tablet Place 1 tablet (20 mg total) into feeding tube daily at 6 PM. Patient taking differently: Place 20 mg daily into feeding tube.  01/10/17   Cherene Altes, MD  thiamine 100 MG tablet Place 1 tablet (100 mg total) into feeding tube daily. 01/11/17   Cherene Altes, MD  Water For Irrigation, Sterile (FREE WATER) SOLN Place 100 mLs into feeding tube every 3 (three) hours. Patient taking differently: Place 4 (four) times daily into feeding tube. Per sister - 4 syringes 4 times daily - unknown quantity - maybe 60 mls per syringe 01/10/17   Cherene Altes, MD    Family History Family History  Problem Relation Age of Onset  . Diabetes Mellitus II Brother   . CAD Neg Hx    . Stroke Neg Hx     Social History Social History   Tobacco Use  . Smoking status: Former Research scientist (life sciences)  . Smokeless tobacco: Never Used  . Tobacco comment: Smoked heavily until the day of his stroke  Substance Use Topics  . Alcohol use: Yes    Comment: Drank heavily prior to stroke per sister  . Drug use: Not on file     Allergies   Patient has no known allergies.   Review of Systems Review of Systems  Unable to perform ROS: Mental status change  Constitutional: Positive for chills.  Respiratory: Positive for cough and shortness of breath.   Cardiovascular: Negative for chest pain.  Psychiatric/Behavioral: Positive for confusion.     Physical Exam Updated Vital Signs SpO2 91% Comment: room air  Physical Exam  Constitutional:  He appears well-developed and well-nourished. No distress.  HENT:  Head: Normocephalic and atraumatic.  Mouth/Throat: Oropharynx is clear and moist. No oropharyngeal exudate.  Thick secretions from trach  Eyes: Conjunctivae and EOM are normal. Pupils are equal, round, and reactive to light.  Neck: Normal range of motion.  Trach in place with thick secretions.  No bleeding.  Cardiovascular: Intact distal pulses. Tachycardia present.  No murmur heard. Pulmonary/Chest: No stridor. Tachypnea noted. He has rhonchi. He exhibits no tenderness.  Abdominal: Soft. There is no tenderness.  Musculoskeletal: He exhibits no tenderness.  Neurological: He is alert.  Skin: Capillary refill takes less than 2 seconds. He is not diaphoretic. No erythema. No pallor.  Nursing note and vitals reviewed.    ED Treatments / Results  Labs (all labs ordered are listed, but only abnormal results are displayed) Labs Reviewed  CBC WITH DIFFERENTIAL/PLATELET - Abnormal; Notable for the following components:      Result Value   WBC 16.8 (*)    Hemoglobin 12.0 (*)    HCT 37.5 (*)    RDW 16.3 (*)    Platelets 681 (*)    Neutro Abs 11.5 (*)    Monocytes Absolute 2.4  (*)    All other components within normal limits  COMPREHENSIVE METABOLIC PANEL - Abnormal; Notable for the following components:   Potassium 5.8 (*)    CO2 20 (*)    BUN 28 (*)    Calcium 11.0 (*)    Total Protein 9.0 (*)    Albumin 3.2 (*)    Anion gap 17 (*)    All other components within normal limits  URINALYSIS, ROUTINE W REFLEX MICROSCOPIC - Abnormal; Notable for the following components:   APPearance CLOUDY (*)    Protein, ur 100 (*)    Leukocytes, UA LARGE (*)    Bacteria, UA FEW (*)    All other components within normal limits  CBG MONITORING, ED - Abnormal; Notable for the following components:   Glucose-Capillary 100 (*)    All other components within normal limits  CULTURE, BLOOD (ROUTINE X 2)  CULTURE, BLOOD (ROUTINE X 2)  URINE CULTURE  CULTURE, EXPECTORATED SPUTUM-ASSESSMENT  LIPASE, BLOOD  PROTIME-INR  BRAIN NATRIURETIC PEPTIDE  CK  AMMONIA  BASIC METABOLIC PANEL  I-STAT CG4 LACTIC ACID, ED    EKG  EKG Interpretation  Date/Time:  Friday March 30 2017 11:21:57 EST Ventricular Rate:  114 PR Interval:    QRS Duration: 90 QT Interval:  306 QTC Calculation: 422 R Axis:   72 Text Interpretation:  Sinus tachycardia Probable left atrial enlargement RSR' in V1 or V2, probably normal variant Minimal ST depression When compared to prior, faster rate. No STEMI Confirmed by Antony Blackbird [phone]) on 03/30/2017 11:31:35 AM       Radiology Ct Head Wo Contrast  Result Date: 03/30/2017 CLINICAL DATA:  Altered level of consciousness EXAM: CT HEAD WITHOUT CONTRAST TECHNIQUE: Contiguous axial images were obtained from the base of the skull through the vertex without intravenous contrast. COMPARISON:  10/20/2016 FINDINGS: Brain: No evidence of acute infarction, hemorrhage, extra-axial collection, ventriculomegaly, or mass effect. Old large left cerebellar infarct with encephalomalacia. Old right cerebellar infarct with encephalomalacia. Left frontoparietal chronic  infarct with encephalomalacia. Old left thalamic lacunar infarct. Generalized cerebral atrophy. Periventricular white matter low attenuation likely secondary to microangiopathy. Vascular: Cerebrovascular atherosclerotic calcifications are noted. Skull: Negative for fracture or focal lesion. Sinuses/Orbits: Visualized portions of the orbits are unremarkable. Visualized portions of the paranasal sinuses and mastoid  air cells are unremarkable. Other: None. IMPRESSION: 1. No acute intracranial pathology. Electronically Signed   By: Kathreen Devoid   On: 03/30/2017 12:10   Dg Chest Portable 1 View  Result Date: 03/30/2017 CLINICAL DATA:  Cough and increasing tracheal secretions EXAM: PORTABLE CHEST 1 VIEW COMPARISON:  Chest radiograph March 02, 2017 and chest CT March 02, 2017 FINDINGS: Tracheostomy catheter tip is 5.2 cm above the carina. There is currently no edema or consolidation. Heart is upper normal in size with pulmonary vascularity within normal limits. No adenopathy. There is degenerative change in each shoulder. 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 IMPRESSION: Tracheostomy as described without pneumothorax. No edema or consolidation. Cardiac silhouette within normal limits. 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 Electronically Signed   By: Lowella Grip III M.D.   On: 03/30/2017 11:49    Procedures Procedures (including critical care time)  Medications Ordered in ED Medications  gabapentin (NEURONTIN) capsule 100 mg (not administered)  free water 200 mL (not administered)  feeding supplement (JEVITY 1.2 CAL) liquid 1,000 mL (not administered)  amLODipine (NORVASC) tablet 10 mg (not administered)  cloNIDine (CATAPRES) tablet 0.1 mg (not administered)  clopidogrel (PLAVIX) tablet 75 mg (not administered)  MEDLINE mouth rinse (not administered)  pravastatin (PRAVACHOL) tablet 20 mg (not administered)  thiamine (VITAMIN B-1) tablet 100 mg (not  administered)  metoprolol tartrate (LOPRESSOR) tablet 25 mg (not administered)  acetaminophen (TYLENOL) tablet 1,000 mg (not administered)  famotidine (PEPCID) tablet 40 mg (not administered)  albuterol (PROVENTIL) (2.5 MG/3ML) 0.083% nebulizer solution 2.5 mg (not administered)  aspirin chewable tablet 81 mg (not administered)  0.9 %  sodium chloride infusion (not administered)  LORazepam (ATIVAN) injection 1 mg (not administered)  ipratropium-albuterol (DUONEB) 0.5-2.5 (3) MG/3ML nebulizer solution 3 mL (3 mLs Nebulization Given 03/30/17 1950)  ceFEPIme (MAXIPIME) 1 g in dextrose 5 % 50 mL IVPB (not administered)  vancomycin (VANCOCIN) 500 mg in sodium chloride 0.9 % 100 mL IVPB (not administered)  calcium chloride 1 g in sodium chloride 0.9 % 100 mL IVPB (not administered)  sodium polystyrene (KAYEXALATE) 15 GM/60ML suspension 15 g (not administered)  ceFEPIme (MAXIPIME) 2 g in dextrose 5 % 50 mL IVPB (0 g Intravenous Stopped 03/30/17 1600)  vancomycin (VANCOCIN) IVPB 1000 mg/200 mL premix (0 mg Intravenous Stopped 03/30/17 1715)  sodium chloride 0.9 % bolus 1,000 mL (0 mLs Intravenous Stopped 03/30/17 1601)    And  sodium chloride 0.9 % bolus 1,000 mL (0 mLs Intravenous Stopped 03/30/17 1645)     Initial Impression / Assessment and Plan / ED Course  I have reviewed the triage vital signs and the nursing notes.  Pertinent labs & imaging results that were available during my care of the patient were reviewed by me and considered in my medical decision making (see chart for details).     Scott Rivera is a 66 y.o. male with a past medical history significant for hypertension, stroke, chronic kidney disease, trach and feeding tube dependence, prior aspiration pneumonia, and prior rhabdo who presents with altered mental status, increased cough, increased production from his trach, and tachycardia.  Patient is brought in by EMS he reports that for the last few days, patient has had  worsening coughing.  Patient has had a thick sputum being suctioned from his trach.  Family told EMS that they had to suction him every several minutes which is increased from prior.  They also report that has had decrease in his  mental status.  He normally is more talkative.  EMS reports there does  not appear to be any new neurologic deficits in regards to numbness or weakness by family report.  Patient is tachycardic and tactile warmth on arrival.  On arrival, rectal temp is 100.3.  Patient is tachycardic at 110.  Patient's lungs have coarse breath sounds in all lung fields.  Minimal wheezing.  Abdomen and chest are nontender.  Patient was able to move all extremities.      Based on patient's history of aspiration pneumonia and the thick secretions coming from his trach, concerned about pneumonia causing his symptoms.  Patient will have x-ray and laboratory testing to further evaluate.  Given his history of stroke and his altered mental status, CT of the head will also be obtained.  Anticipate reassessment after her diagnostic workup.  CT head showed no acute intracranial abnormality.  Leukocytosis of 16.8 present.  Mild anemia.  Urinalysis revealed leukocytes and bacteria, no nitrites.  Patient continues to have intermittent episodes of hypoxia.  Thick sputum suctioned from trach.  Given the clinical concern, despite chest x-ray showing no pneumonia, patient will be treated for pneumonia.  Patient will be treated for H CAP.  Patient will be admitted for further management of HCAP.   Final Clinical Impressions(s) / ED Diagnoses   Final diagnoses:  HCAP (healthcare-associated pneumonia)    ED Discharge Orders    None     Clinical Impression: 1. HCAP (healthcare-associated pneumonia)     Disposition: Admit   This note was prepared with assistance of Dragon voice recognition software. Occasional wrong-word or sound-a-like substitutions may have occurred due to the inherent limitations  of voice recognition software.     Aryana Wonnacott, Gwenyth Allegra, MD 03/30/17 2059

## 2017-03-30 NOTE — ED Notes (Signed)
Patient suctioned, patient tolerated well.  Pharmacy notified of medications needing to be verified to be administered in the ED.

## 2017-03-31 DIAGNOSIS — A419 Sepsis, unspecified organism: Principal | ICD-10-CM

## 2017-03-31 DIAGNOSIS — Z960 Presence of urogenital implants: Secondary | ICD-10-CM

## 2017-03-31 DIAGNOSIS — T83511D Infection and inflammatory reaction due to indwelling urethral catheter, subsequent encounter: Secondary | ICD-10-CM

## 2017-03-31 DIAGNOSIS — Z931 Gastrostomy status: Secondary | ICD-10-CM

## 2017-03-31 DIAGNOSIS — J9601 Acute respiratory failure with hypoxia: Secondary | ICD-10-CM

## 2017-03-31 DIAGNOSIS — Z9889 Other specified postprocedural states: Secondary | ICD-10-CM

## 2017-03-31 DIAGNOSIS — I10 Essential (primary) hypertension: Secondary | ICD-10-CM

## 2017-03-31 DIAGNOSIS — N390 Urinary tract infection, site not specified: Secondary | ICD-10-CM

## 2017-03-31 DIAGNOSIS — D649 Anemia, unspecified: Secondary | ICD-10-CM

## 2017-03-31 LAB — CBC
HCT: 32.1 % — ABNORMAL LOW (ref 39.0–52.0)
Hemoglobin: 9.8 g/dL — ABNORMAL LOW (ref 13.0–17.0)
MCH: 27.6 pg (ref 26.0–34.0)
MCHC: 30.5 g/dL (ref 30.0–36.0)
MCV: 90.4 fL (ref 78.0–100.0)
Platelets: 618 10*3/uL — ABNORMAL HIGH (ref 150–400)
RBC: 3.55 MIL/uL — ABNORMAL LOW (ref 4.22–5.81)
RDW: 16.6 % — ABNORMAL HIGH (ref 11.5–15.5)
WBC: 15.6 10*3/uL — ABNORMAL HIGH (ref 4.0–10.5)

## 2017-03-31 LAB — URINE CULTURE: Culture: 80000 — AB

## 2017-03-31 LAB — INFLUENZA PANEL BY PCR (TYPE A & B)
Influenza A By PCR: NEGATIVE
Influenza B By PCR: NEGATIVE

## 2017-03-31 LAB — MRSA PCR SCREENING: MRSA by PCR: NEGATIVE

## 2017-03-31 LAB — GLUCOSE, CAPILLARY
Glucose-Capillary: 103 mg/dL — ABNORMAL HIGH (ref 65–99)
Glucose-Capillary: 74 mg/dL (ref 65–99)
Glucose-Capillary: 111 mg/dL — ABNORMAL HIGH (ref 65–99)

## 2017-03-31 MED ORDER — ASPIRIN 81 MG PO CHEW
81.0000 mg | CHEWABLE_TABLET | Freq: Every day | ORAL | Status: DC
  Administered 2017-04-01 – 2017-04-04 (×3): 81 mg
  Filled 2017-03-31 (×4): qty 1

## 2017-03-31 MED ORDER — FREE WATER
200.0000 mL | Freq: Four times a day (QID) | Status: DC
  Administered 2017-03-31 (×4): 200 mL

## 2017-03-31 MED ORDER — JEVITY 1.2 CAL PO LIQD
1000.0000 mL | ORAL | Status: DC
  Administered 2017-03-31: 1000 mL
  Filled 2017-03-31 (×10): qty 1000

## 2017-03-31 MED ORDER — JEVITY 1.2 CAL PO LIQD: 1000.0000 mL | ORAL | Status: DC

## 2017-03-31 NOTE — Progress Notes (Signed)
PROGRESS NOTE    Scott Rivera   QQI:[id number]  DOB: May 13, 1950  DOA: 03/30/2017 PCP: Patient, No Pcp Per   Brief Narrative:  Scott Rivera is a 66 y.o. 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   Most of the history is obtained from patient's sister at bedside as patient is nonverbal at this time In the ER, noted to be coughing intermittently with yellowish secretions from his tracheostomy site. Found to have leukocytosis, low-grade temp 100.3, tachycardia increasing hypoxia and respiratory distress.  UA is strongly suggestive of UTI but patient also has a chronic foley.   Subjective: Non-verbal. Does not follow commands for me.     Assessment & Plan:   Principal Problem:   Acute Bronchitis, leukocytosis, low grade fever (Sepsis) & acute resp failure - in the past has had both pneumonia (thought to be due to aspiration) and bronchitis - currently has leukocytosis, fever but CXR negative- repeat CXR in AM - d/c Vanc as MRSA PCR negative-  - cont Cefepime for now - has been weaned off of O2 to room air - pulse ox in 90s.  - blood cultures and resp cultures pending   Active Problems:   UTI ? - hard to tell if he has an actual UTI as he has a chronic foley and UA is (not surprisingly) positive - f/u cultures  Mild hyperkalemia  - on admission K was 5.8- has resolved after Kayexalate and IVF boluses (2 L at least in ER)  Mild metabolic acidosis on Bmet with anion gap - CO2 20, AG 17 - AG resolved- CO2 improving - follow-     CVA-  bed bound, tracheostomy, PEG, chronic foley - non-verbal- does not follow commands - lives at home with family    Essential hypertension - Norvasc,  Clonidine, Metoprolol     S/P percutaneous endoscopic gastrostomy (PEG) tube placement  - cont tube feeds and free water  Dehydration - given 2 L NS In ER- cont free water through tube- following I and O      Chronic anemia with thrombocytosis - at baseline- 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  DVT prophylaxis: SCDs Code Status: Full code Family Communication:  Disposition Plan: home when stable Consultants:   none Procedures:   none Antimicrobials:  Anti-infectives (From admission, onward)   Start     Dose/Rate Route Frequency Ordered Stop   03/31/17 0400  vancomycin (VANCOCIN) 500 mg in sodium chloride 0.9 % 100 mL IVPB  Status:  Discontinued     500 mg 100 mL/hr over 60 Minutes Intravenous Every 12 hours 03/30/17 1749 03/31/17 1126   03/30/17 2200  ceFEPIme (MAXIPIME) 1 g in dextrose 5 % 50 mL IVPB     1 g 100 mL/hr over 30 Minutes Intravenous Every 8 hours 03/30/17 1749     03/30/17 1500  ceFEPIme (MAXIPIME) 2 g in dextrose 5 % 50 mL IVPB     2 g 100 mL/hr over 30 Minutes Intravenous  Once 03/30/17 1455 03/30/17 1600   03/30/17 1500  vancomycin (VANCOCIN) IVPB 1000 mg/200 mL premix     1,000 mg 200 mL/hr over 60 Minutes Intravenous  Once 03/30/17 1455 03/30/17 1715       Objective: Vitals:   03/31/17 0815 03/31/17  0900 03/31/17 1056 03/31/17 1101  BP: 123/60 121/67 (!) 163/68 (!) 163/68  Pulse: 87 81 (!) 112   Resp: 17 13    Temp:  (!) 97.5 F (36.4 C)    TempSrc:  Oral    SpO2: 98% 90%    Weight:        Intake/Output Summary (Last 24 hours) at 03/31/2017 1126 Last data filed at 03/31/2017 8841 Gross per 24 hour  Intake 1698 ml  Output 650 ml  Net 1048 ml   Filed Weights   03/30/17 1126  Weight: 62.6 kg (138 lb)    Examination: General exam: Appears comfortable  HEENT: PERRLA, oral mucosa dry, no sclera icterus or thrush- trach present and collar in place Respiratory system: sounds congested- no crackles when listened to anteriorly.  Respiratory effort normal. Cardiovascular system: S1 & S2 heard, RRR.    Gastrointestinal system: Abdomen soft, non-tender, nondistended. Normal bowel sound. PEG tube intact. No organomegaly Central nervous system: Alert - cannot tell if oriented-  Does not follow commands- will not move arms and legs for me Extremities: No cyanosis, clubbing or edema Psychiatry:  nonverbal    Data Reviewed: I have personally reviewed following labs and imaging studies  CBC: Recent Labs  Lab 03/30/17 1302 03/31/17 0941  WBC 16.8* 15.6*  NEUTROABS 11.5*  --   HGB 12.0* 9.8*  HCT 37.5* 32.1*  MCV 88.2 90.4  PLT 681* 660*   Basic Metabolic Panel: Recent Labs  Lab 03/30/17 1302 03/30/17 2156  NA 144 138  K 5.8* 4.0  CL 107 108  CO2 20* 21*  GLUCOSE 83 87  BUN 28* 22*  CREATININE 1.15 0.96  CALCIUM 11.0* 10.7*   GFR: Estimated Creatinine Clearance: 67 mL/min (by C-G formula based on SCr of 0.96 mg/dL). Liver Function Tests: Recent Labs  Lab 03/30/17 1302  AST 34  ALT 22  ALKPHOS 100  BILITOT 0.8  PROT 9.0*  ALBUMIN 3.2*   Recent Labs  Lab 03/30/17 1302  LIPASE 23   No results for input(s): AMMONIA in the last 168 hours. Coagulation Profile: Recent Labs  Lab 03/30/17 1302  INR 1.17   Cardiac Enzymes: Recent Labs  Lab 03/30/17 1302  CKTOTAL 98   BNP (last 3 results) No results for input(s): PROBNP in the last 8760 hours. HbA1C: No results for input(s): HGBA1C in the last 72 hours. CBG: Recent Labs  Lab 03/30/17 1123 03/31/17 1051  GLUCAP 100* 103*   Lipid Profile: No results for input(s): CHOL, HDL, LDLCALC, TRIG, CHOLHDL, LDLDIRECT in the last 72 hours. Thyroid Function Tests: No results for input(s): TSH, T4TOTAL, FREET4, T3FREE, THYROIDAB in the last 72 hours. Anemia Panel: No results for input(s): VITAMINB12, FOLATE, FERRITIN, TIBC, IRON, RETICCTPCT in the last 72 hours. Urine analysis:    Component Value Date/Time   COLORURINE YELLOW 03/30/2017  1209   APPEARANCEUR CLOUDY (A) 03/30/2017 1209   LABSPEC 1.017 03/30/2017 1209   PHURINE 5.0 03/30/2017 1209   GLUCOSEU NEGATIVE 03/30/2017 1209   HGBUR NEGATIVE 03/30/2017 1209   BILIRUBINUR NEGATIVE 03/30/2017 1209   [place] 03/30/2017 1209   PROTEINUR 100 (A) 03/30/2017 1209   NITRITE NEGATIVE 03/30/2017 1209   LEUKOCYTESUR LARGE (A) 03/30/2017 1209   Sepsis Labs: @LABRCNTIP (procalcitonin:4,lacticidven:4) ) Recent Results (from the past 240 hour(s))  Culture, respiratory (NON-Expectorated)     Status: None (Preliminary result)   Collection Time: 03/30/17  4:06 PM  Result Value Ref Range Status   Specimen Description TRACHEAL ASPIRATE  Final  Special Requests NONE  Final   Gram Stain   Final    ABUNDANT WBC PRESENT,BOTH PMN AND MONONUCLEAR MODERATE GRAM POSITIVE RODS MODERATE GRAM NEGATIVE RODS    Culture PENDING  Incomplete   Report Status PENDING  Incomplete  MRSA PCR Screening     Status: None   Collection Time: 03/31/17  8:37 AM  Result Value Ref Range Status   MRSA by PCR NEGATIVE NEGATIVE Final    Comment:        The GeneXpert MRSA Assay (FDA approved for NASAL specimens only), is one component of a comprehensive MRSA colonization surveillance program. It is not intended to diagnose MRSA infection nor to guide or monitor treatment for MRSA infections.          Radiology Studies: Ct Head Wo Contrast  Result Date: 03/30/2017 CLINICAL DATA:  Altered level of consciousness EXAM: CT HEAD WITHOUT CONTRAST TECHNIQUE: Contiguous axial images were obtained from the base of the skull through the vertex without intravenous contrast. COMPARISON:  10/20/2016 FINDINGS: Brain: No evidence of acute infarction, hemorrhage, extra-axial collection, ventriculomegaly, or mass effect. Old large left cerebellar infarct with encephalomalacia. Old right cerebellar infarct with encephalomalacia. Left frontoparietal chronic infarct with encephalomalacia. Old left  thalamic lacunar infarct. Generalized cerebral atrophy. Periventricular white matter low attenuation likely secondary to microangiopathy. Vascular: Cerebrovascular atherosclerotic calcifications are noted. Skull: Negative for fracture or focal lesion. Sinuses/Orbits: Visualized portions of the orbits are unremarkable. Visualized portions of the paranasal sinuses and mastoid air cells are unremarkable. Other: None. IMPRESSION: 1. No acute intracranial pathology. Electronically Signed   By: Kathreen Devoid   On: 03/30/2017 12:10   Dg Chest Portable 1 View  Result Date: 03/30/2017 CLINICAL DATA:  Cough and increasing tracheal secretions EXAM: PORTABLE CHEST 1 VIEW COMPARISON:  Chest radiograph March 02, 2017 and chest CT March 02, 2017 FINDINGS: Tracheostomy catheter tip is 5.2 cm above the carina. There is currently no edema or consolidation. Heart is upper normal in size with pulmonary vascularity within normal limits. No adenopathy. There is degenerative change in each shoulder. 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 IMPRESSION: Tracheostomy as described without pneumothorax. No edema or consolidation. Cardiac silhouette within normal limits. 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 Electronically Signed   By: Lowella Grip III M.D.   On: 03/30/2017 11:49      Scheduled Meds: . acetaminophen  1,000 mg Per Tube TID  . amLODipine  10 mg Per Tube Daily  . aspirin  81 mg Oral Daily  . cloNIDine  0.1 mg Per Tube TID  . clopidogrel  75 mg Per Tube Daily  . famotidine  40 mg Per Tube Daily  . free water  200 mL Per Tube QID  . gabapentin  100 mg Oral TID  . ipratropium-albuterol  3 mL Nebulization TID  . mouth rinse  15 mL Mouth Rinse QID  . metoprolol tartrate  25 mg Per Tube BID  . pravastatin  20 mg Per Tube q1800  . thiamine  100 mg Per Tube Daily   Continuous Infusions: . ceFEPime (MAXIPIME) IV 1 g (03/31/17 0624)  . feeding supplement (JEVITY 1.2 CAL)  1,000 mL (03/30/17 2112)     LOS: 1 day    Time spent in minutes: 35    Debbe Odea, MD [facility] Pager: [email or web address] Password [email or web address] 03/31/2017, 11:26 AM

## 2017-03-31 NOTE — Progress Notes (Signed)
Patient was agitated.  Lorazepam 0.5 mg was going to be administered as per prn order.  When this nurse assessed patient's IV access the catheter was removed.  No signs or symptoms of trauma noted on IV site.  This nurse attempted to reinsert PIV x 2 attempts.  Not successful.  IV team consulted for PIV placement.  Lorazepam wasted with Johnnye Sima, RN.

## 2017-03-31 NOTE — Progress Notes (Signed)
Initial Nutrition Assessment  DOCUMENTATION CODES:   Severe malnutrition in context of chronic illness  INTERVENTION:    Continue Jevity 1.2 formula at goal rate of 60 ml/hr  Provides 1728 kcals, 80 gm protein, 1162 ml of free water daily  NUTRITION DIAGNOSIS:   Severe Malnutrition related to chronic illness(CKD, hx of CVA) as evidenced by severe fat depletion, severe muscle depletion  GOAL:   Patient will meet greater than or equal to 90% of their needs  MONITOR:   TF tolerance, Vent status, Labs, Weight trends, Skin, I & O's  REASON FOR ASSESSMENT:   Consult Enteral/tube feeding initiation and management  ASSESSMENT:   66 y.o. 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     Pt well known to this RD with previous hospitalizations. At baseline pt is non-verbal and bed bound. Lives at home. Currently on trach collar. Home TF regimen is Jevity 1.2 formula at goal rate of 60 ml/hr via PEG tube.  Medications reviewed and include thiamine, Pepcid and ABX. Free water flushes at 200 ml every 4 hours. Labs reviewed. CBG's 100-103-74.  NUTRITION - FOCUSED PHYSICAL EXAM:    Most Recent Value  Orbital Region  Unable to assess  Upper Arm Region  Severe depletion  Thoracic and Lumbar Region  Unable to assess  Buccal Region  Severe depletion  Temple Region  Severe depletion  Clavicle Bone Region  Severe depletion  Clavicle and Acromion Bone Region  Severe depletion  Scapular Bone Region  Unable to assess  Dorsal Hand  Unable to assess  Patellar Region  Severe depletion  Anterior Thigh Region  Severe depletion  Posterior Calf Region  Severe depletion  Edema (RD Assessment)  None     Diet Order:   Diet NPO time specified  EDUCATION NEEDS:   No education needs have been identified at this time  Skin:  Skin Assessment: Skin Integrity Issues: Skin Integrity Issues:: Stage I Stage I: sacrum  Last BM:  12/15  Height:   Ht Readings from Last 1 Encounters:  03/31/17 5\' 6"  (5.859 m)    Weight:   Wt Readings from Last 1 Encounters:  03/30/17 138 lb (62.6 kg)    Ideal Body Weight:  64.5 kg  BMI:  Body mass index is 22.27 kg/m.  Estimated Nutritional Needs:   Kcal:  1600-1800  Protein:  80-95 gm  Fluid:  1.6-1.8 L  Arthur Holms, RD, LDN Pager #: [phone] After-Hours Pager #: [phone]

## 2017-04-01 ENCOUNTER — Inpatient Hospital Stay (HOSPITAL_COMMUNITY): Payer: Medicare HMO

## 2017-04-01 DIAGNOSIS — J9621 Acute and chronic respiratory failure with hypoxia: Secondary | ICD-10-CM

## 2017-04-01 DIAGNOSIS — J189 Pneumonia, unspecified organism: Secondary | ICD-10-CM

## 2017-04-01 LAB — CBC
HCT: 35.2 % — ABNORMAL LOW (ref 39.0–52.0)
Hemoglobin: 11 g/dL — ABNORMAL LOW (ref 13.0–17.0)
MCH: 27.8 pg (ref 26.0–34.0)
MCHC: 31.3 g/dL (ref 30.0–36.0)
MCV: 89.1 fL (ref 78.0–100.0)
Platelets: 626 10*3/uL — ABNORMAL HIGH (ref 150–400)
RBC: 3.95 MIL/uL — ABNORMAL LOW (ref 4.22–5.81)
RDW: 16.3 % — ABNORMAL HIGH (ref 11.5–15.5)
WBC: 14.5 10*3/uL — ABNORMAL HIGH (ref 4.0–10.5)

## 2017-04-01 LAB — GLUCOSE, CAPILLARY
Glucose-Capillary: 101 mg/dL — ABNORMAL HIGH (ref 65–99)
Glucose-Capillary: 65 mg/dL (ref 65–99)
Glucose-Capillary: 81 mg/dL (ref 65–99)
Glucose-Capillary: 84 mg/dL (ref 65–99)
Glucose-Capillary: 90 mg/dL (ref 65–99)
Glucose-Capillary: 98 mg/dL (ref 65–99)

## 2017-04-01 LAB — BASIC METABOLIC PANEL
Anion gap: 13 (ref 5–15)
BUN: 16 mg/dL (ref 6–20)
CO2: 22 mmol/L (ref 22–32)
Calcium: 10.6 mg/dL — ABNORMAL HIGH (ref 8.9–10.3)
Chloride: 105 mmol/L (ref 101–111)
Creatinine, Ser: 0.84 mg/dL (ref 0.61–1.24)
GFR calc Af Amer: >60 mL/min (ref >60)
GFR calc non Af Amer: >60 mL/min (ref >60)
Glucose, Bld: 69 mg/dL (ref 65–99)
Potassium: 4.5 mmol/L (ref 3.5–5.1)
Sodium: 140 mmol/L (ref 135–145)

## 2017-04-01 MED ORDER — LORAZEPAM 2 MG/ML IJ SOLN: 0.5000 mg | Freq: Four times a day (QID) | INTRAMUSCULAR | Status: DC | PRN

## 2017-04-01 MED ORDER — FREE WATER
200.0000 mL | Freq: Three times a day (TID) | Status: DC
  Administered 2017-04-01 – 2017-04-02 (×3): 200 mL

## 2017-04-01 MED ORDER — DEXTROSE 50 % IV SOLN
INTRAVENOUS | Status: AC
  Administered 2017-04-01: 25 mL via INTRAVENOUS
  Filled 2017-04-01: qty 50

## 2017-04-01 NOTE — Progress Notes (Signed)
Hypoglycemic Event  CBG:65  Treatment:  9mls of Dextrose given via right forearm PIV. Symptoms:  No symptoms noted.  Follow-up CBG: Time CBG Result: 84 Possible Reasons for Event:  Tube feeding bacame dislodged and leaked on the bed.  Tube feeding reconnected.  Comments/MD notified:Hospitalist on call from 7p-7a notified via [place] text    [place]

## 2017-04-01 NOTE — Progress Notes (Signed)
PROGRESS NOTE    PILAR CORRALES   ZOX:[id number]  DOB: [birth year]  DOA: 03/30/2017 PCP: Patient, No Pcp Per   Brief Narrative:  Scott Rivera is a 66 y.o. 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   Most of the history is obtained from patient's sister at bedside as patient is nonverbal at this time In the ER, noted to be coughing intermittently with yellowish secretions from his tracheostomy site. Found to have leukocytosis, low-grade temp 100.3, tachycardia increasing hypoxia and respiratory distress.  UA is strongly suggestive of UTI but patient also has a chronic foley.   Subjective: Non-verbal.      Assessment & Plan:   Principal Problem:   Acute Bronchitis, leukocytosis, low grade fever (Sepsis) & acute resp failure - with large amounts of yellow sputum on admission - in the past has had both pneumonia (thought to be due to aspiration) and bronchitis - currently has leukocytosis, fever but CXR negative- repeat CXR is suggestive of possible LLL atelectasis vs developing pneumonia- I would recommend 5-7 days of treatment for both bronchitis and Pneumonia - Influenza negatvie - d/c Vanc as MRSA PCR negative-  - currently on Cefepime   - 12/15>> has been weaned off of O2 to room air - pulse ox in 90s.  - blood cultures neg - resp cultures pending  - WBC count still 14  Active Problems:   UTI ? - hard to tell if he has an actual UTI as he has a chronic foley and UA is (not surprisingly) positive -  Cultures showing yeast- follow for now- fever resolved with antibiotics and so, this is doubtful a true UTI  Chronic foley - will see if he can void without it- d/c foley and place condom  cath  Mild hyperkalemia  - on admission K was 5.8- has resolved after Kayexalate and IVF boluses (2 L at least in ER)  Mild metabolic acidosis on Bmet with anion gap - CO2 20, AG 17 - both resolved    CVA-  bed bound, tracheostomy, PEG, chronic foley - non-verbal- does not follow commands - lives at home with family    Essential hypertension - Norvasc, Clonidine, Metoprolol     S/P percutaneous endoscopic gastrostomy (PEG) tube placement  - cont tube feeds and free water  Dehydration - given 2 L NS In ER- cont free water through tube- following I and O      Chronic anemia with thrombocytosis - at baseline- 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  DVT prophylaxis: SCDs Code Status: Full code Family Communication:  Disposition Plan: home when stable Consultants:   none Procedures:   none Antimicrobials:  Anti-infectives (From admission, onward)   Start     Dose/Rate Route Frequency Ordered Stop   03/31/17 0400  vancomycin (VANCOCIN) 500 mg in sodium chloride 0.9 % 100 mL IVPB  Status:  Discontinued     500 mg 100 mL/hr over 60 Minutes Intravenous Every 12 hours 03/30/17 1749 03/31/17 1126   03/30/17 2200  ceFEPIme (MAXIPIME) 1 g in dextrose 5 % 50 mL IVPB     1 g 100 mL/hr over 30 Minutes Intravenous Every 8 hours 03/30/17 1749     03/30/17 1500  ceFEPIme (MAXIPIME) 2 g in dextrose 5 %  50 mL IVPB     2 g 100 mL/hr over 30 Minutes Intravenous  Once 03/30/17 1455 03/30/17 1600   03/30/17 1500  vancomycin (VANCOCIN) IVPB 1000 mg/200 mL premix     1,000 mg 200 mL/hr over 60 Minutes Intravenous  Once 03/30/17 1455 03/30/17 1715       Objective: Vitals:   04/01/17 0813 04/01/17 0900 04/01/17 1000 04/01/17 1132  BP:  126/61 104/67   Pulse: 75 89 75 75  Resp: 20 18 16 19   Temp:      TempSrc:      SpO2: 96% 94% 92% 95%  Weight:      Height:        Intake/Output Summary (Last 24 hours) at 04/01/2017 1237 Last data filed at 04/01/2017 0815 Gross per 24 hour   Intake 1864 ml  Output 3050 ml  Net -1186 ml   Filed Weights   03/30/17 1126 04/01/17 0424  Weight: 62.6 kg (138 lb) 65.6 kg (144 lb 10 oz)    Examination: General exam: Appears comfortable  HEENT: PERRLA, oral mucosa dry, no sclera icterus or thrush- trach present - on room air Respiratory system: sounds congested- no crackles when listened to anteriorly. Respiratory effort normal. Cardiovascular system: S1 & S2 heard, RRR.    Gastrointestinal system: Abdomen soft, non-tender, nondistended. Normal bowel sound. PEG tube intact. No organomegaly Central nervous system: Alert -  Does not follow commands- will not move arms and legs for me Extremities: No cyanosis, clubbing or edema Psychiatry:  Nonverbal-does not attempt to communicate in any way    Data Reviewed: I have personally reviewed following labs and imaging studies  CBC: Recent Labs  Lab 03/30/17 1302 03/31/17 0941 04/01/17 0515  WBC 16.8* 15.6* 14.5*  NEUTROABS 11.5*  --   --   HGB 12.0* 9.8* 11.0*  HCT 37.5* 32.1* 35.2*  MCV 88.2 90.4 89.1  PLT 681* 618* 025*   Basic Metabolic Panel: Recent Labs  Lab 03/30/17 1302 03/30/17 2156 04/01/17 0515  NA 144 138 140  K 5.8* 4.0 4.5  CL 107 108 105  CO2 20* 21* 22  GLUCOSE 83 87 69  BUN 28* 22* 16  CREATININE 1.15 0.96 0.84  CALCIUM 11.0* 10.7* 10.6*   GFR: Estimated Creatinine Clearance: 78.1 mL/min (by C-G formula based on SCr of 0.84 mg/dL). Liver Function Tests: Recent Labs  Lab 03/30/17 1302  AST 34  ALT 22  ALKPHOS 100  BILITOT 0.8  PROT 9.0*  ALBUMIN 3.2*   Recent Labs  Lab 03/30/17 1302  LIPASE 23   No results for input(s): AMMONIA in the last 168 hours. Coagulation Profile: Recent Labs  Lab 03/30/17 1302  INR 1.17   Cardiac Enzymes: Recent Labs  Lab 03/30/17 1302  CKTOTAL 98   BNP (last 3 results) No results for input(s): PROBNP in the last 8760 hours. HbA1C: No results for input(s): HGBA1C in the last 72  hours. CBG: Recent Labs  Lab 04/01/17 0024 04/01/17 0419 04/01/17 0543 04/01/17 0718 04/01/17 1118  GLUCAP 90 65 84 81 101*   Lipid Profile: No results for input(s): CHOL, HDL, LDLCALC, TRIG, CHOLHDL, LDLDIRECT in the last 72 hours. Thyroid Function Tests: No results for input(s): TSH, T4TOTAL, FREET4, T3FREE, THYROIDAB in the last 72 hours. Anemia Panel: No results for input(s): VITAMINB12, FOLATE, FERRITIN, TIBC, IRON, RETICCTPCT in the last 72 hours. Urine analysis:    Component Value Date/Time   COLORURINE YELLOW 03/30/2017 1209   APPEARANCEUR CLOUDY (A) 03/30/2017 1209  LABSPEC 1.017 03/30/2017 1209   PHURINE 5.0 03/30/2017 1209   GLUCOSEU NEGATIVE 03/30/2017 1209   HGBUR NEGATIVE 03/30/2017 1209   BILIRUBINUR NEGATIVE 03/30/2017 1209   [place] 03/30/2017 1209   PROTEINUR 100 (A) 03/30/2017 1209   NITRITE NEGATIVE 03/30/2017 1209   LEUKOCYTESUR LARGE (A) 03/30/2017 1209   Sepsis Labs: @LABRCNTIP (procalcitonin:4,lacticidven:4) ) Recent Results (from the past 240 hour(s))  Blood culture (routine x 2)     Status: None (Preliminary result)   Collection Time: 03/30/17 11:42 AM  Result Value Ref Range Status   Specimen Description BLOOD LEFT FOREARM  Final   Special Requests   Final    BOTTLES DRAWN AEROBIC AND ANAEROBIC Blood Culture adequate volume   Culture NO GROWTH 1 DAY  Final   Report Status PENDING  Incomplete  Urine culture     Status: Abnormal   Collection Time: 03/30/17 12:09 PM  Result Value Ref Range Status   Specimen Description URINE, CATHETERIZED  Final   Special Requests NONE  Final   Culture 80,000 COLONIES/mL YEAST (A)  Final   Report Status 03/31/2017 FINAL  Final  Blood culture (routine x 2)     Status: None (Preliminary result)   Collection Time: 03/30/17  1:02 PM  Result Value Ref Range Status   Specimen Description BLOOD RIGHT FOREARM  Final   Special Requests IN PEDIATRIC BOTTLE Blood Culture adequate volume  Final   Culture  NO GROWTH 1 DAY  Final   Report Status PENDING  Incomplete  Culture, respiratory (NON-Expectorated)     Status: None (Preliminary result)   Collection Time: 03/30/17  4:06 PM  Result Value Ref Range Status   Specimen Description TRACHEAL ASPIRATE  Final   Special Requests NONE  Final   Gram Stain   Final    ABUNDANT WBC PRESENT,BOTH PMN AND MONONUCLEAR MODERATE GRAM POSITIVE RODS MODERATE GRAM NEGATIVE RODS    Culture CULTURE REINCUBATED FOR BETTER GROWTH  Final   Report Status PENDING  Incomplete  MRSA PCR Screening     Status: None   Collection Time: 03/31/17  8:37 AM  Result Value Ref Range Status   MRSA by PCR NEGATIVE NEGATIVE Final    Comment:        The GeneXpert MRSA Assay (FDA approved for NASAL specimens only), is one component of a comprehensive MRSA colonization surveillance program. It is not intended to diagnose MRSA infection nor to guide or monitor treatment for MRSA infections.          Radiology Studies: Dg Chest Port 1 View  Result Date: 04/01/2017 CLINICAL DATA:  66 year old male with cough. EXAM: PORTABLE CHEST 1 VIEW COMPARISON:  03/30/2017 and earlier. FINDINGS: Portable AP semi upright view at 0642 hours. Stable tracheostomy tube. Stable cardiac size and mediastinal contours. Stable lung volumes with increasing left retrocardiac and lung base opacity now partially obscuring the left hemidiaphragm. No pneumothorax or pulmonary edema. Allowing for portable technique the right lung is clear. Stable visualized osseous structures. Stable lower left chest wall surgical clips. Negative visible bowel gas pattern. IMPRESSION: 1. Increasing left lung base opacity could reflect atelectasis or pneumonia. 2. The right lung is clear. Electronically Signed   By: Genevie Ann M.D.   On: 04/01/2017 09:23      Scheduled Meds: . acetaminophen  1,000 mg Per Tube TID  . amLODipine  10 mg Per Tube Daily  . aspirin  81 mg Per Tube Daily  . cloNIDine  0.1 mg Per Tube TID   .  clopidogrel  75 mg Per Tube Daily  . famotidine  40 mg Per Tube Daily  . free water  200 mL Per Tube Q8H  . gabapentin  100 mg Oral TID  . ipratropium-albuterol  3 mL Nebulization TID  . mouth rinse  15 mL Mouth Rinse QID  . metoprolol tartrate  25 mg Per Tube BID  . pravastatin  20 mg Per Tube q1800  . thiamine  100 mg Per Tube Daily   Continuous Infusions: . ceFEPime (MAXIPIME) IV Stopped (04/01/17 0700)  . feeding supplement (JEVITY 1.2 CAL) Stopped (03/31/17 2000)     LOS: 2 days    Time spent in minutes: 35    Debbe Odea, MD [facility] Pager: [email or web address] Password [facility] 04/01/2017, 12:37 PM

## 2017-04-01 NOTE — Progress Notes (Signed)
Report called to [place], Therapist, sports. Pt sister informed of transfer to [place] [facility]. Pt transferred by 2 RN's and sister at bedside.

## 2017-04-01 NOTE — Progress Notes (Signed)
NURSING PROGRESS NOTE  Scott Rivera [id number] Transfer Data: 04/01/2017 3:42 PM Attending Provider: Debbe Odea, MD LKG:[id number], No Pcp Per Code Status: full  Allergies:  Patient has no known allergies. Past Medical History:   has a past medical history of Anemia due to chronic kidney disease, Aspiration pneumonia ([place]), Chronic kidney disease, stage 3 (moderate) ([place]), Hypertension, and Stroke ([place]). Past Surgical History:   has a past surgical history that includes Knee surgery; IR GASTROSTOMY TUBE MOD SED (10/27/2016); Tracheostomy tube placement; IR PATIENT EVAL TECH 0-60 MINS (01/05/2017); IR PATIENT EVAL TECH 0-60 MINS (01/09/2017); and IR PATIENT EVAL TECH 0-60 MINS (03/03/2017). Social History:   reports that he has quit smoking. he has never used smokeless tobacco. He reports that he drinks alcohol.  Scott Rivera is a 65 y.o. male patient transferred from [id number]>  Blood pressure (!) 103/59, pulse 73, temperature 97.9 F (36.6 C), temperature source Oral, resp. rate 18, height 5\' 6"  (1.676 m), weight 65.6 kg (144 lb 10 oz), SpO2 100 %.  Cardiac Monitoring: Box # [id number] in place. Cardiac monitor yields:normal sinus rhythm.  IV Fluids:  IV in place, occlusive dsg intact without redness, IV cath forearm right, condition patent and no redness IV push only, no IV fluids.   Skin: See LDA for wounds  Will continue to evaluate and treat per MD orders.   Joslyn Hy, MSN, RN, [employer]

## 2017-04-01 NOTE — Progress Notes (Signed)
Patient's right forearm PIV occluded.  IV consult placed for difficult IV insertion.

## 2017-04-02 LAB — CBC
HCT: 36.7 % — ABNORMAL LOW (ref 39.0–52.0)
Hemoglobin: 11.7 g/dL — ABNORMAL LOW (ref 13.0–17.0)
MCH: 28.2 pg (ref 26.0–34.0)
MCHC: 31.9 g/dL (ref 30.0–36.0)
MCV: 88.4 fL (ref 78.0–100.0)
Platelets: 523 10*3/uL — ABNORMAL HIGH (ref 150–400)
RBC: 4.15 MIL/uL — ABNORMAL LOW (ref 4.22–5.81)
RDW: 16.5 % — ABNORMAL HIGH (ref 11.5–15.5)
WBC: 11.6 10*3/uL — ABNORMAL HIGH (ref 4.0–10.5)

## 2017-04-02 LAB — GLUCOSE, CAPILLARY
Glucose-Capillary: 117 mg/dL — ABNORMAL HIGH (ref 65–99)
Glucose-Capillary: 83 mg/dL (ref 65–99)
Glucose-Capillary: 84 mg/dL (ref 65–99)
Glucose-Capillary: 87 mg/dL (ref 65–99)
Glucose-Capillary: 88 mg/dL (ref 65–99)
Glucose-Capillary: 90 mg/dL (ref 65–99)

## 2017-04-02 NOTE — Progress Notes (Addendum)
PROGRESS NOTE    KEVANTE LUNT   OQH:[id number]  DOB: 1951-04-11  DOA: 03/30/2017 PCP: Patient, No Pcp Per   Brief Narrative:  Scott Rivera is a 66 y.o. 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   Most of the history is obtained from patient's sister at bedside as patient is nonverbal at this time In the ER, noted to be coughing intermittently with yellowish secretions from his tracheostomy site. Found to have leukocytosis, low-grade temp 100.3, tachycardia increasing hypoxia and respiratory distress.  UA is strongly suggestive of UTI but patient also has a chronic foley.   Subjective: Non-verbal.  Sister present today and states that she is worried about his PEG which as been frequently clogged (about 4 times) this past month and had to be unclogged in the ER.     Assessment & Plan:   Principal Problem:   Acute Bronchitis, leukocytosis, low grade fever (Sepsis) & acute resp failure - with large amounts of yellow sputum on admission - in the past has had both pneumonia (thought to be due to aspiration) and bronchitis - currently has leukocytosis, fever but CXR negative- repeat CXR is suggestive of possible LLL atelectasis vs developing pneumonia- I would recommend 5-7 days of treatment for both bronchitis and Pneumonia - Influenza negatvie - d/c Vanc as MRSA PCR negative-  - currently on Cefepime   - 12/15>> has been weaned off of O2 to room air - pulse ox in 90s.  - blood cultures neg - resp cultures growing pseudomonas now - WBC count steadily improving on Cefepime  Active Problems:   UTI ? - hard to tell if he has an actual UTI as he has a chronic foley and UA is (not surprisingly)  positive -  Cultures showing yeast- follow for now- fever resolved with above antibiotics and so, this is doubtful a true UTI  Chronic foley - 12/16- will see if he can void without it- d/c'd foley and placed condom cath - 12/17 appears to be voiding well and had 1250 cc urine yesterday - follow   PEG tube issues - have asked IR to evaluate it-  - per RN, tube feeds were going through appropriately however when she tried to flush it, it is not flushing  - will see if we can hook up back to tube feeds for tonight  Mild hyperkalemia  - on admission K was 5.8- has resolved after Kayexalate and IVF boluses (2 L at least in ER)  Mild metabolic acidosis on Bmet with anion gap - CO2 20, AG 17 - both resolved    CVA-  bed bound, tracheostomy, PEG, chronic foley - non-verbal- does not follow commands - lives at home with family    Essential hypertension - Norvasc, Clonidine, Metoprolol     S/P percutaneous endoscopic gastrostomy (PEG) tube placement  - cont tube feeds and free water  Dehydration - given 2 L NS In ER- cont free water through tube- following I and O      Chronic anemia with thrombocytosis - at baseline- 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  DVT prophylaxis: SCDs Code Status: Full code Family Communication:  Disposition Plan: home when stable Consultants:   none Procedures:   none Antimicrobials:  Anti-infectives (From admission, onward)  Start     Dose/Rate Route Frequency Ordered Stop   03/31/17 0400  vancomycin (VANCOCIN) 500 mg in sodium chloride 0.9 % 100 mL IVPB  Status:  Discontinued     500 mg 100 mL/hr over 60 Minutes Intravenous Every 12 hours 03/30/17 1749 03/31/17 1126   03/30/17 2200  ceFEPIme (MAXIPIME) 1 g in dextrose 5 % 50 mL IVPB     1 g 100 mL/hr over 30 Minutes Intravenous Every 8 hours 03/30/17 1749     03/30/17 1500  ceFEPIme (MAXIPIME) 2 g in dextrose 5 % 50 mL IVPB     2 g 100 mL/hr over 30 Minutes Intravenous  Once 03/30/17  1455 03/30/17 1600   03/30/17 1500  vancomycin (VANCOCIN) IVPB 1000 mg/200 mL premix     1,000 mg 200 mL/hr over 60 Minutes Intravenous  Once 03/30/17 1455 03/30/17 1715       Objective: Vitals:   04/02/17 1359 04/02/17 1418 04/02/17 1427 04/02/17 1631  BP:   (!) 164/80   Pulse:   (!) 123   Resp:   15   Temp:   98.2 F (36.8 C)   TempSrc:   Axillary   SpO2: 98% 96%  94%  Weight:      Height:        Intake/Output Summary (Last 24 hours) at 04/02/2017 1654 Last data filed at 04/02/2017 0934 Gross per 24 hour  Intake -  Output 950 ml  Net -950 ml   Filed Weights   04/01/17 0424 04/01/17 1512 04/02/17 0500  Weight: 65.6 kg (144 lb 10 oz) 65.6 kg (144 lb 10 oz) 65.1 kg (143 lb 8.3 oz)    Examination: General exam: Appears comfortable  HEENT: PERRLA, oral mucosa dry, no sclera icterus or thrush- trach present - on room air Respiratory system: sounds very congested- no crackles when listened to anteriorly. Respiratory effort normal. Cardiovascular system: S1 & S2 heard, RRR.    Gastrointestinal system: Abdomen soft, non-tender, nondistended. Normal bowel sound. No organomegaly Central nervous system: Alert -  Does not follow commands- moves involuntarily  Extremities: No cyanosis, clubbing or edema Psychiatry:  Nonverbal-     Data Reviewed: I have personally reviewed following labs and imaging studies  CBC: Recent Labs  Lab 03/30/17 1302 03/31/17 0941 04/01/17 0515 04/02/17 0324  WBC 16.8* 15.6* 14.5* 11.6*  NEUTROABS 11.5*  --   --   --   HGB 12.0* 9.8* 11.0* 11.7*  HCT 37.5* 32.1* 35.2* 36.7*  MCV 88.2 90.4 89.1 88.4  PLT 681* 618* 626* 213*   Basic Metabolic Panel: Recent Labs  Lab 03/30/17 1302 03/30/17 2156 04/01/17 0515  NA 144 138 140  K 5.8* 4.0 4.5  CL 107 108 105  CO2 20* 21* 22  GLUCOSE 83 87 69  BUN 28* 22* 16  CREATININE 1.15 0.96 0.84  CALCIUM 11.0* 10.7* 10.6*   GFR: Estimated Creatinine Clearance: 78.1 mL/min (by C-G formula based  on SCr of 0.84 mg/dL). Liver Function Tests: Recent Labs  Lab 03/30/17 1302  AST 34  ALT 22  ALKPHOS 100  BILITOT 0.8  PROT 9.0*  ALBUMIN 3.2*   Recent Labs  Lab 03/30/17 1302  LIPASE 23   No results for input(s): AMMONIA in the last 168 hours. Coagulation Profile: Recent Labs  Lab 03/30/17 1302  INR 1.17   Cardiac Enzymes: Recent Labs  Lab 03/30/17 1302  CKTOTAL 98   BNP (last 3 results) No results for input(s): PROBNP in the last 8760  hours. HbA1C: No results for input(s): HGBA1C in the last 72 hours. CBG: Recent Labs  Lab 04/02/17 0030 04/02/17 0418 04/02/17 0802 04/02/17 1154 04/02/17 1539  GLUCAP 117* 83 87 90 88   Lipid Profile: No results for input(s): CHOL, HDL, LDLCALC, TRIG, CHOLHDL, LDLDIRECT in the last 72 hours. Thyroid Function Tests: No results for input(s): TSH, T4TOTAL, FREET4, T3FREE, THYROIDAB in the last 72 hours. Anemia Panel: No results for input(s): VITAMINB12, FOLATE, FERRITIN, TIBC, IRON, RETICCTPCT in the last 72 hours. Urine analysis:    Component Value Date/Time   COLORURINE YELLOW 03/30/2017 1209   APPEARANCEUR CLOUDY (A) 03/30/2017 1209   LABSPEC 1.017 03/30/2017 1209   PHURINE 5.0 03/30/2017 1209   GLUCOSEU NEGATIVE 03/30/2017 1209   HGBUR NEGATIVE 03/30/2017 1209   BILIRUBINUR NEGATIVE 03/30/2017 1209   [place] 03/30/2017 1209   PROTEINUR 100 (A) 03/30/2017 1209   NITRITE NEGATIVE 03/30/2017 1209   LEUKOCYTESUR LARGE (A) 03/30/2017 1209   Sepsis Labs: @LABRCNTIP (procalcitonin:4,lacticidven:4) ) Recent Results (from the past 240 hour(s))  Blood culture (routine x 2)     Status: None (Preliminary result)   Collection Time: 03/30/17 11:42 AM  Result Value Ref Range Status   Specimen Description BLOOD LEFT FOREARM  Final   Special Requests   Final    BOTTLES DRAWN AEROBIC AND ANAEROBIC Blood Culture adequate volume   Culture NO GROWTH 3 DAYS  Final   Report Status PENDING  Incomplete  Urine culture      Status: Abnormal   Collection Time: 03/30/17 12:09 PM  Result Value Ref Range Status   Specimen Description URINE, CATHETERIZED  Final   Special Requests NONE  Final   Culture 80,000 COLONIES/mL YEAST (A)  Final   Report Status 03/31/2017 FINAL  Final  Blood culture (routine x 2)     Status: None (Preliminary result)   Collection Time: 03/30/17  1:02 PM  Result Value Ref Range Status   Specimen Description BLOOD RIGHT FOREARM  Final   Special Requests IN PEDIATRIC BOTTLE Blood Culture adequate volume  Final   Culture NO GROWTH 3 DAYS  Final   Report Status PENDING  Incomplete  Culture, respiratory (NON-Expectorated)     Status: None (Preliminary result)   Collection Time: 03/30/17  4:06 PM  Result Value Ref Range Status   Specimen Description TRACHEAL ASPIRATE  Final   Special Requests NONE  Final   Gram Stain   Final    ABUNDANT WBC PRESENT,BOTH PMN AND MONONUCLEAR MODERATE GRAM POSITIVE RODS MODERATE GRAM NEGATIVE RODS    Culture   Final    MODERATE PSEUDOMONAS AERUGINOSA ABUNDANT STENOTROPHOMONAS [place] TO FOLLOW    Report Status PENDING  Incomplete  MRSA PCR Screening     Status: None   Collection Time: 03/31/17  8:37 AM  Result Value Ref Range Status   MRSA by PCR NEGATIVE NEGATIVE Final    Comment:        The GeneXpert MRSA Assay (FDA approved for NASAL specimens only), is one component of a comprehensive MRSA colonization surveillance program. It is not intended to diagnose MRSA infection nor to guide or monitor treatment for MRSA infections.          Radiology Studies: Dg Chest Port 1 View  Result Date: 04/01/2017 CLINICAL DATA:  66 year old male with cough. EXAM: PORTABLE CHEST 1 VIEW COMPARISON:  03/30/2017 and earlier. FINDINGS: Portable AP semi upright view at 0642 hours. Stable tracheostomy tube. Stable cardiac size and mediastinal contours. Stable lung volumes with  increasing left retrocardiac and lung base opacity now  partially obscuring the left hemidiaphragm. No pneumothorax or pulmonary edema. Allowing for portable technique the right lung is clear. Stable visualized osseous structures. Stable lower left chest wall surgical clips. Negative visible bowel gas pattern. IMPRESSION: 1. Increasing left lung base opacity could reflect atelectasis or pneumonia. 2. The right lung is clear. Electronically Signed   By: Genevie Ann M.D.   On: 04/01/2017 09:23      Scheduled Meds: . acetaminophen  1,000 mg Per Tube TID  . amLODipine  10 mg Per Tube Daily  . aspirin  81 mg Per Tube Daily  . cloNIDine  0.1 mg Per Tube TID  . clopidogrel  75 mg Per Tube Daily  . famotidine  40 mg Per Tube Daily  . free water  200 mL Per Tube Q8H  . gabapentin  100 mg Oral TID  . ipratropium-albuterol  3 mL Nebulization TID  . mouth rinse  15 mL Mouth Rinse QID  . metoprolol tartrate  25 mg Per Tube BID  . pravastatin  20 mg Per Tube q1800  . thiamine  100 mg Per Tube Daily   Continuous Infusions: . ceFEPime (MAXIPIME) IV Stopped (04/02/17 1453)  . feeding supplement (JEVITY 1.2 CAL) Stopped (03/31/17 2000)     LOS: 3 days    Time spent in minutes: 35    Debbe Odea, MD [facility] Pager: [email or web address] Password [facility] 04/02/2017, 4:54 PM

## 2017-04-02 NOTE — Progress Notes (Signed)
Pharmacy Antibiotic Note   Scott Rivera is a 66 y.o. male admitted on 03/30/2017 with hx trach / PEG with increased sputum production and respiratory distress >>  pneumonia.  Patient continues on cefepime for PNA.    SCr stable  Cefepime 12/14>> Vanc 12/14 > 12/15  12/14 BCx >>ngtd 12/14 Ucx >> 80k yeast (chronic foley) 12/14 Trach asp>>moderate GPR, GNR, reincubated 12/15 MRSA pcr neg  12/15 Influenza pcr neg  Plan: Continue cefepime 1g IV Q8h Monitor clinical picture, renal function F/U C&S, abx deescalation / LOT  Height: 5\' 6"  (167.6 cm) Weight: 143 lb 8.3 oz (65.1 kg) IBW/kg (Calculated) : 63.8  Temp (24hrs), Avg:98.1 F (36.7 C), Min:97.9 F (36.6 C), Max:98.2 F (36.8 C)  Recent Labs  Lab 03/30/17 1150 03/30/17 1302 03/30/17 2156 03/31/17 0941 04/01/17 0515 04/02/17 0324  WBC  --  16.8*  --  15.6* 14.5* 11.6*  CREATININE  --  1.15 0.96  --  0.84  --   LATICACIDVEN 1.71  --   --   --   --   --     Estimated Creatinine Clearance: 78.1 mL/min (by C-G formula based on SCr of 0.84 mg/dL).    No Known Allergies  Thank you for allowing pharmacy to be a part of this patient's care.  [employer], Pharm.D., BCPS Clinical Pharmacist Pager: [phone] Clinical phone for 04/02/2017 from 8:30-4:00 is [phone]. After 4pm, please call Main Rx [phone]) for assistance. 04/02/2017 3:02 PM

## 2017-04-02 NOTE — Progress Notes (Signed)
At 1030 this RN attempted to give Pt his morning meds. Tube feedings had been running patent, through Peg tube, no complications. Feedings were paused and flush was attempted before med admin. Flush was unsuccessful to gravity. MD was present in the room and aware of situation. More attempts were made using smaller port as well as applying light pressure to syringe with plunger, all of which were unsuccessful. Per MD Peg tube was left clamped and IR consulted to assess the situation. Per Pt's sister who was also present in room, stated the Peg tube has been getting clogged recently. MD also aware of this. Will continue to monitor and assess.

## 2017-04-03 ENCOUNTER — Encounter (HOSPITAL_COMMUNITY): Payer: Self-pay | Admitting: Interventional Radiology

## 2017-04-03 ENCOUNTER — Inpatient Hospital Stay (HOSPITAL_COMMUNITY): Payer: Medicare HMO

## 2017-04-03 HISTORY — PX: IR PATIENT EVAL TECH 0-60 MINS: IMG5564

## 2017-04-03 LAB — COMPREHENSIVE METABOLIC PANEL
ALT: 17 U/L (ref 17–63)
AST: 14 U/L — ABNORMAL LOW (ref 15–41)
Albumin: 2.6 g/dL — ABNORMAL LOW (ref 3.5–5.0)
Alkaline Phosphatase: 82 U/L (ref 38–126)
Anion gap: 9 (ref 5–15)
BUN: 16 mg/dL (ref 6–20)
CO2: 23 mmol/L (ref 22–32)
Calcium: 10.8 mg/dL — ABNORMAL HIGH (ref 8.9–10.3)
Chloride: 106 mmol/L (ref 101–111)
Creatinine, Ser: 1.05 mg/dL (ref 0.61–1.24)
GFR calc Af Amer: >60 mL/min (ref >60)
GFR calc non Af Amer: >60 mL/min (ref >60)
Glucose, Bld: 82 mg/dL (ref 65–99)
Potassium: 3.7 mmol/L (ref 3.5–5.1)
Sodium: 138 mmol/L (ref 135–145)
Total Bilirubin: 0.6 mg/dL (ref 0.3–1.2)
Total Protein: 7.9 g/dL (ref 6.5–8.1)

## 2017-04-03 LAB — CBC
HCT: 34 % — ABNORMAL LOW (ref 39.0–52.0)
Hemoglobin: 10.6 g/dL — ABNORMAL LOW (ref 13.0–17.0)
MCH: 27.2 pg (ref 26.0–34.0)
MCHC: 31.2 g/dL (ref 30.0–36.0)
MCV: 87.2 fL (ref 78.0–100.0)
Platelets: 638 10*3/uL — ABNORMAL HIGH (ref 150–400)
RBC: 3.9 MIL/uL — ABNORMAL LOW (ref 4.22–5.81)
RDW: 16.2 % — ABNORMAL HIGH (ref 11.5–15.5)
WBC: 12.6 10*3/uL — ABNORMAL HIGH (ref 4.0–10.5)

## 2017-04-03 LAB — GLUCOSE, CAPILLARY
Glucose-Capillary: 104 mg/dL — ABNORMAL HIGH (ref 65–99)
Glucose-Capillary: 111 mg/dL — ABNORMAL HIGH (ref 65–99)
Glucose-Capillary: 68 mg/dL (ref 65–99)
Glucose-Capillary: 70 mg/dL (ref 65–99)
Glucose-Capillary: 72 mg/dL (ref 65–99)
Glucose-Capillary: 73 mg/dL (ref 65–99)
Glucose-Capillary: 74 mg/dL (ref 65–99)

## 2017-04-03 LAB — CULTURE, RESPIRATORY W GRAM STAIN

## 2017-04-03 MED ORDER — LEVOFLOXACIN 500 MG PO TABS
500.0000 mg | ORAL_TABLET | Freq: Every day | ORAL | Status: DC
  Administered 2017-04-03 – 2017-04-04 (×2): 500 mg via ORAL
  Filled 2017-04-03 (×2): qty 1

## 2017-04-03 MED ORDER — CLONIDINE HCL 0.1 MG/24HR TD PTWK
0.1000 mg | MEDICATED_PATCH | TRANSDERMAL | Status: DC
  Administered 2017-04-03: 0.1 mg via TRANSDERMAL
  Filled 2017-04-03: qty 1

## 2017-04-03 MED ORDER — IOPAMIDOL (ISOVUE-300) INJECTION 61%
INTRAVENOUS | Status: AC
  Filled 2017-04-03: qty 50

## 2017-04-03 MED ORDER — LIDOCAINE VISCOUS 2 % MT SOLN
OROMUCOSAL | Status: AC
  Filled 2017-04-03: qty 15

## 2017-04-03 MED ORDER — FREE WATER
100.0000 mL | Status: DC
  Administered 2017-04-03 – 2017-04-04 (×5): 100 mL

## 2017-04-03 NOTE — Progress Notes (Signed)
PROGRESS NOTE    [name]   [name]  DOB: Mar 20, 1951  DOA: 03/30/2017 PCP: Patient, No Pcp Per   Brief Narrative:   66 y.o. 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    history obtained from patient's sister at bedside- patient is nonverbal at this time  In the ER, noted to be coughing intermittently with yellowish secretions from his tracheostomy site. Found to have leukocytosis, low-grade temp 100.3, tachycardia increasing hypoxia and respiratory distress.  UA is strongly suggestive of UTI but patient also has a chronic foley.   Subjective:  Awake alert non-verbal Just back from IR for declogging tube Relative with many concerns at bedside and tube concerns RN relating increasing secretions Sat at bedside is 99% and patient appears comfortable    Assessment & Plan:      Acute Bronchitis, leukocytosis, low grade fever (Sepsis) & acute resp failure - with large amounts of yellow sputum on admission - previously both pneumonia (thought to be due to aspiration) and bronchitis - + leukocytosis, + fever but CXR negative- repeat CXR is suggestive of possible LLL atelectasis vs developing pneumonia- for 5-7 days of treatment for both bronchitis and Pneumonia - Influenzae Neg - d/c Vanc as MRSA PCR negative-  currently on Cefepime - WBC count steadily improving on Cefepime- resp cultures growing pseudomonas now--going to change him today to PO Levaquin 500 daily - 12/15>> has been weaned off of O2 to room air - pulse ox in 90s.  - blood cultures neg -getting CXR 12/18 as increasing secretions and r/o subclin aspiration  Colonized Urine from Chr Foley cathter - unclear if UTI as he has a chronic foley and UA is (not  surprisingly) positive -  Cultures showing yeast- follow for now- fever resolved with above antibiotics and so, this is doubtful a true UTI  Chronic foley - 12/16- will see if he can void without it- d/c'd foley and placed condom cath - 12/17 appears to be voiding well and had 1250 cc urine yesterday - follow   PEG tube issues - have asked IR to evaluate it-  - per RN, tube feeds were going through appropriately -IR declogged tube 12/18 -If recurs will nee dIR input regarding change of tube - changing to Flushes q4 100 cc to prevent clogging    Mild hyperkalemia  - on admission K was 5.8- has resolved after Kayexalate and IVF boluses (2 L at least in ER) -resolved Mild metabolic acidosis on Bmet with anion gap - CO2 20, AG 17 - both resolved    CVA-  bed bound, tracheostomy, PEG, chronic foley - non-verbal- does not follow commands - lives at home with family currently and will go home on d/c home    Essential hypertension - Norvasc 10 daily , Clonidine 0.1 changed to patch, Metoprolol 25 bid     S/P percutaneous endoscopic gastrostomy (PEG) tube placement  - cont tube feeds 60 cc/h and free water as above discussion  Dehydration - given 2 L NS In ER- cont free water through tube- following I and O    Chronic anemia with thrombocytosis - at baseline- 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  DVT prophylaxis: SCDs Code Status: Full code Family Communication:  Disposition Plan: home when stable Consultants:   None  Procedures:   None  Antimicrobials:  Vanc until 12/14 Cefepime 12/14  Objective: Vitals:   04/03/17 0143 04/03/17 0349 04/03/17 0514 04/03/17 0803  BP: (!) 165/62  (!) 164/90   Pulse:   (!) 119 (!) 122  Resp:   19 20  Temp:   98.3 F (36.8 C)   TempSrc:   Oral   SpO2:  95% 97% 93%  Weight:   65.7 kg (144 lb 13.5 oz)   Height:        Intake/Output Summary (Last 24 hours) at 04/03/2017 1040 Last data filed at 04/03/2017 0602 Gross per  24 hour  Intake -  Output 900 ml  Net -900 ml   Filed Weights   04/01/17 1512 04/02/17 0500 04/03/17 0514  Weight: 65.6 kg (144 lb 10 oz) 65.1 kg (143 lb 8.3 oz) 65.7 kg (144 lb 13.5 oz)    Examination: chronically frail and ill appearing but in no Resp distress eomi doesn't follow commands cta b no added sound abd soft, PEG in place Neuro intact without focal neuro deficit--Exam limited as patient wearing mittens on hands    Data Reviewed: I have personally reviewed following labs and imaging studies  CBC: Recent Labs  Lab 03/30/17 1302 03/31/17 0941 04/01/17 0515 04/02/17 0324  WBC 16.8* 15.6* 14.5* 11.6*  NEUTROABS 11.5*  --   --   --   HGB 12.0* 9.8* 11.0* 11.7*  HCT 37.5* 32.1* 35.2* 36.7*  MCV 88.2 90.4 89.1 88.4  PLT 681* 618* 626* 010*   Basic Metabolic Panel: Recent Labs  Lab 03/30/17 1302 03/30/17 2156 04/01/17 0515  NA 144 138 140  K 5.8* 4.0 4.5  CL 107 108 105  CO2 20* 21* 22  GLUCOSE 83 87 69  BUN 28* 22* 16  CREATININE 1.15 0.96 0.84  CALCIUM 11.0* 10.7* 10.6*   GFR: Estimated Creatinine Clearance: 78.1 mL/min (by C-G formula based on SCr of 0.84 mg/dL). Liver Function Tests: Recent Labs  Lab 03/30/17 1302  AST 34  ALT 22  ALKPHOS 100  BILITOT 0.8  PROT 9.0*  ALBUMIN 3.2*   Recent Labs  Lab 03/30/17 1302  LIPASE 23   No results for input(s): AMMONIA in the last 168 hours. Coagulation Profile: Recent Labs  Lab 03/30/17 1302  INR 1.17   Cardiac Enzymes: Recent Labs  Lab 03/30/17 1302  CKTOTAL 98   BNP (last 3 results) No results for input(s): PROBNP in the last 8760 hours. HbA1C: No results for input(s): HGBA1C in the last 72 hours. CBG: Recent Labs  Lab 04/02/17 1539 04/02/17 2000 04/03/17 0031 04/03/17 0415 04/03/17 0804  GLUCAP 88 84 74 72 70   Lipid Profile: No results for input(s): CHOL, HDL, LDLCALC, TRIG, CHOLHDL, LDLDIRECT in the last 72 hours. Thyroid Function Tests: No results for input(s): TSH,  T4TOTAL, FREET4, T3FREE, THYROIDAB in the last 72 hours. Anemia Panel: No results for input(s): VITAMINB12, FOLATE, FERRITIN, TIBC, IRON, RETICCTPCT in the last 72 hours. Urine analysis:    Component Value Date/Time   COLORURINE YELLOW 03/30/2017 1209   APPEARANCEUR CLOUDY (A) 03/30/2017 1209   LABSPEC 1.017 03/30/2017 1209   PHURINE 5.0 03/30/2017 1209   GLUCOSEU NEGATIVE 03/30/2017 1209   HGBUR NEGATIVE 03/30/2017 1209   BILIRUBINUR NEGATIVE 03/30/2017 1209   KETONESUR NEGATIVE 03/30/2017 1209   PROTEINUR 100 (A) 03/30/2017 1209   NITRITE NEGATIVE 03/30/2017 1209   LEUKOCYTESUR LARGE (A) 03/30/2017 1209   Sepsis Labs: @LABRCNTIP (procalcitonin:4,lacticidven:4) ) Recent Results (from the past 240 hour(s))  Blood culture (routine x 2)     Status: None (Preliminary result)   Collection Time: 03/30/17 11:42 AM  Result Value Ref Range Status   Specimen Description BLOOD LEFT FOREARM  Final   Special Requests   Final    BOTTLES DRAWN AEROBIC AND ANAEROBIC Blood Culture adequate volume   Culture NO GROWTH 3 DAYS  Final   Report Status PENDING  Incomplete  Urine culture     Status: Abnormal   Collection Time: 03/30/17 12:09 PM  Result Value Ref Range Status   Specimen Description URINE, CATHETERIZED  Final   Special Requests NONE  Final   Culture 80,000 COLONIES/mL YEAST (A)  Final   Report Status 03/31/2017 FINAL  Final  Blood culture (routine x 2)     Status: None (Preliminary result)   Collection Time: 03/30/17  1:02 PM  Result Value Ref Range Status   Specimen Description BLOOD RIGHT FOREARM  Final   Special Requests IN PEDIATRIC BOTTLE Blood Culture adequate volume  Final   Culture NO GROWTH 3 DAYS  Final   Report Status PENDING  Incomplete  Culture, respiratory (NON-Expectorated)     Status: None (Preliminary result)   Collection Time: 03/30/17  4:06 PM  Result Value Ref Range Status   Specimen Description TRACHEAL ASPIRATE  Final   Special Requests NONE  Final   Gram  Stain   Final    ABUNDANT WBC PRESENT,BOTH PMN AND MONONUCLEAR MODERATE GRAM POSITIVE RODS MODERATE GRAM NEGATIVE RODS    Culture   Final    MODERATE PSEUDOMONAS AERUGINOSA ABUNDANT STENOTROPHOMONAS MALTOPHILIA SUSCEPTIBILITIES TO FOLLOW    Report Status PENDING  Incomplete  MRSA PCR Screening     Status: None   Collection Time: 03/31/17  8:37 AM  Result Value Ref Range Status   MRSA by PCR NEGATIVE NEGATIVE Final    Comment:        The GeneXpert MRSA Assay (FDA approved for NASAL specimens only), is one component of a comprehensive MRSA colonization surveillance program. It is not intended to diagnose MRSA infection nor to guide or monitor treatment for MRSA infections.          Radiology Studies: Ir Patient Eval Tech 0-60 Mins  Result Date: 04/03/2017 Karenann Cai     04/03/2017  9:42 AM Unclogged Gastrostomy tube with Enteral feeding tube declogger.  Flushed tube vigorously with normal saline and replace port to tube.  No complications France Ravens RT R, VI     Scheduled Meds: . acetaminophen  1,000 mg Per Tube TID  . amLODipine  10 mg Per Tube Daily  . aspirin  81 mg Per Tube Daily  . cloNIDine  0.1 mg Per Tube TID  . clopidogrel  75 mg Per Tube Daily  . famotidine  40 mg Per Tube Daily  . free water  200 mL Per Tube Q8H  . gabapentin  100 mg Oral TID  . ipratropium-albuterol  3 mL Nebulization TID  . mouth rinse  15 mL Mouth Rinse QID  . metoprolol tartrate  25 mg Per Tube BID  . pravastatin  20 mg Per Tube q1800  . thiamine  100 mg Per Tube Daily   Continuous Infusions: . ceFEPime (MAXIPIME) IV Stopped (04/03/17 0552)  . feeding supplement (JEVITY 1.2 CAL) Stopped (03/31/17 2000)     LOS: 4 days    Time spent in minutes: 25    Nita Sells, MD [facility] [phone] [email or web address] Password [email or web address] 04/03/2017, 10:40 AM

## 2017-04-03 NOTE — Procedures (Signed)
Unclogged Gastrostomy tube with Enteral feeding tube declogger.  Flushed tube vigorously with normal saline and replace port to tube.  No complications  France Ravens RT R, VI

## 2017-04-03 NOTE — Care Management Important Message (Signed)
Important Message  Patient Details  Name: Scott Rivera MRN: [id number] Date of Birth: Jul 04, 1950   Medicare Important Message Given:  Yes    Irlene Crudup Abena 04/03/2017, 10:11 AM

## 2017-04-04 LAB — COMPREHENSIVE METABOLIC PANEL
ALT: 18 U/L (ref 17–63)
AST: 20 U/L (ref 15–41)
Albumin: 2.6 g/dL — ABNORMAL LOW (ref 3.5–5.0)
Alkaline Phosphatase: 83 U/L (ref 38–126)
Anion gap: 15 (ref 5–15)
BUN: 20 mg/dL (ref 6–20)
CO2: 23 mmol/L (ref 22–32)
Calcium: 10.8 mg/dL — ABNORMAL HIGH (ref 8.9–10.3)
Chloride: 101 mmol/L (ref 101–111)
Creatinine, Ser: 1.11 mg/dL (ref 0.61–1.24)
GFR calc Af Amer: >60 mL/min (ref >60)
GFR calc non Af Amer: >60 mL/min (ref >60)
Glucose, Bld: 84 mg/dL (ref 65–99)
Potassium: 4.8 mmol/L (ref 3.5–5.1)
Sodium: 139 mmol/L (ref 135–145)
Total Bilirubin: 0.3 mg/dL (ref 0.3–1.2)
Total Protein: 8.1 g/dL (ref 6.5–8.1)

## 2017-04-04 LAB — CBC WITH DIFFERENTIAL/PLATELET
Basophils Absolute: 0 10*3/uL (ref 0.0–0.1)
Basophils Relative: 0 %
Eosinophils Absolute: 0.6 10*3/uL (ref 0.0–0.7)
Eosinophils Relative: 7 %
HCT: 37.4 % — ABNORMAL LOW (ref 39.0–52.0)
Hemoglobin: 11.7 g/dL — ABNORMAL LOW (ref 13.0–17.0)
Lymphocytes Relative: 19 %
Lymphs Abs: 1.6 10*3/uL (ref 0.7–4.0)
MCH: 27.9 pg (ref 26.0–34.0)
MCHC: 31.3 g/dL (ref 30.0–36.0)
MCV: 89 fL (ref 78.0–100.0)
Monocytes Absolute: 1.4 10*3/uL — ABNORMAL HIGH (ref 0.1–1.0)
Monocytes Relative: 17 %
Neutro Abs: 4.9 10*3/uL (ref 1.7–7.7)
Neutrophils Relative %: 57 %
Platelets: 598 10*3/uL — ABNORMAL HIGH (ref 150–400)
RBC: 4.2 MIL/uL — ABNORMAL LOW (ref 4.22–5.81)
RDW: 16.3 % — ABNORMAL HIGH (ref 11.5–15.5)
WBC: 8.5 10*3/uL (ref 4.0–10.5)

## 2017-04-04 LAB — CULTURE, BLOOD (ROUTINE X 2)
Culture: NO GROWTH
Culture: NO GROWTH
Special Requests: ADEQUATE
Special Requests: ADEQUATE

## 2017-04-04 LAB — GLUCOSE, CAPILLARY
Glucose-Capillary: 80 mg/dL (ref 65–99)
Glucose-Capillary: 93 mg/dL (ref 65–99)
Glucose-Capillary: 95 mg/dL (ref 65–99)
Glucose-Capillary: 95 mg/dL (ref 65–99)

## 2017-04-04 MED ORDER — FREE WATER: 100.0000 mL | 0 refills | Status: DC

## 2017-04-04 MED ORDER — ASPIRIN 81 MG PO CHEW: 81.0000 mg | CHEWABLE_TABLET | Freq: Every day | ORAL | Status: DC

## 2017-04-04 MED ORDER — CLONIDINE 0.1 MG/24HR TD PTWK: 0.1000 mg | MEDICATED_PATCH | TRANSDERMAL | 12 refills | Status: DC

## 2017-04-04 MED ORDER — LEVOFLOXACIN 500 MG PO TABS: 500.0000 mg | ORAL_TABLET | Freq: Every day | ORAL | 0 refills | Status: DC

## 2017-04-04 MED ORDER — ORAL CARE MOUTH RINSE: 15.0000 mL | Freq: Two times a day (BID) | OROMUCOSAL | Status: DC

## 2017-04-04 MED ORDER — CHLORHEXIDINE GLUCONATE 0.12 % MT SOLN
15.0000 mL | Freq: Two times a day (BID) | OROMUCOSAL | Status: DC
  Administered 2017-04-04: 15 mL via OROMUCOSAL
  Filled 2017-04-04: qty 15

## 2017-04-04 NOTE — Discharge Summary (Signed)
Physician Discharge Summary  Scott Rivera MPN:[id number] DOB: [birth year] DOA: 03/30/2017  PCP: Patient, No Pcp Per  Admit date: 03/30/2017 Discharge date: 04/04/2017  Time spent: 35 minutes  Recommendations for Outpatient Follow-up:  1. Will need RN,SLP, Resp therapy to come out and evaluate patient 2.  Poor overall prognosis-would have Goals of care performed as an OP 3. Will need cbc and cmet 1 week 4. Continue peg feeds 60 cc/h--needs free water flushes 100 cc q 4 hourly and if Peg is not working would have this addressed by IR as OP in terms of PEg replacement 5. Complete Levaquin 4 more doses ending 04/07/17 for Pseudomonas in REsp cult 6. Note change of PO clonidine to Clonidine patch  Discharge Diagnoses:  Principal Problem:   Bronchitis/??? Pneumonia Active Problems:   Cerebellar infarct [facility])   Essential hypertension   H/O tracheostomy   S/P percutaneous endoscopic gastrostomy (PEG) tube placement (HCC)   Chronic anemia   Foley catheter in place on admission   UTI (urinary tract infection)   Discharge Condition: guarded overall  Diet recommendation: Peg feeds  Filed Weights   04/02/17 0500 04/03/17 0514 04/04/17 0450  Weight: 65.1 kg (143 lb 8.3 oz) 65.7 kg (144 lb 13.5 oz) 64.2 kg (141 lb 8.6 oz)    History of present illness:  66 y.o. male  Hypertension, cerebellar infarction on 10/15/2016 with result need for tracheostomy and PEG tube placement,  Chronic loculated pleural effusion   pressure ulcers  who presents with concerns about respiratory distress with increased sputum production over the last couple of days.     history obtained from patient's sister at bedside- patient is nonverbal at this time   In the ER, noted to be coughing intermittently with yellowish secretions from his tracheostomy site. Found to have leukocytosis, low-grade temp 100.3, tachycardia increasing hypoxia and respiratory distress.  UA is strongly suggestive of UTI but patient  also has a chronic foley.    Hospital Course:  Acute Bronchitis, leukocytosis, low grade fever (Sepsis) & acute resp failure Influenzae Neg - with large amounts of yellow sputum on admission - previously both pneumonia (thought to be due to aspiration) and bronchitis - + leukocytosis, + fever but CXR negative- repeat CXR is suggestive of possible LLL atelectasis vs developing pneumonia- ezxtending on d/c Rx for PNA/Bronchitis as Pseudomonas on Resp Cult until 12/22 [10 days therapy] - 12/15>> has been weaned off of O2 to room air - pulse ox in 90s.  - blood cultures neg -CXR 12/18 reported as worsening PNA but clinically stabilized, WBC down from 12-->8 on day of d/c   Colonized Urine from Chr Foley cathter - unclear if UTI as he has a chronic foley and UA is (not surprisingly) positive -  Cultures showing yeast- follow for now- fever resolved with above antibiotics and so, this is doubtful a true UTI   Chronic foley - 12/16- will see if he can void without it- d/c'd foley and placed condom cath - 12/17 appears to be voiding well and had 1250 cc urine yesterday   PEG tube issues - have asked IR to evaluate it - per RN, tube feeds were going through appropriately -IR declogged tube 12/18 -If recurs will nee dIR input regarding change of tube - changing on d/c toFlushes q4 100 cc to prevent clogging     Mild hyperkalemia  - on admission K was 5.8- has resolved after Kayexalate and IVF boluses (2 L at least in ER) -resolved Mild metabolic  acidosis on Bmet with anion gap - CO2 20, AG 17 - both resolved     CVA-  bed bound, tracheostomy, PEG, chronic foley - non-verbal- does not follow commands - lives at home with family currently and will go home on d/c home     Essential hypertension - Norvasc 10 daily , Clonidine 0.1 changed to patch, Metoprolol 25 bid     S/P percutaneous endoscopic gastrostomy (PEG) tube placement  - cont tube feeds 60 cc/h and free water as above  discussion   Dehydration - given 2 L NS In ER- cont free water through tube- following I and O     Chronic anemia with thrombocytosis - at baseline- 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   Procedures: PEG tube declog  Consultations:  IR  Discharge Exam: Vitals:   04/04/17 0948 04/04/17 0950  BP:    Pulse:  75  Resp:  20  Temp:    SpO2: 100% 100%    General: eomi ncat perrla no ict no pallor frail and cachectic appearing-coarse secretions trach in place Cardiovascular: s1 s2 no m/r/g slight tachy Respiratory:  Some crepitations and added rales but clears Neuro responsive but combative at times and not foll commands  Discharge Instructions    Allergies as of 04/04/2017   No Known Allergies     Medication List    STOP taking these medications   aspirin 325 MG tablet Replaced by:  aspirin 81 MG chewable tablet   cloNIDine 0.1 MG tablet Commonly known as:  CATAPRES   mouth rinse Liqd solution     TAKE these medications   acetaminophen 500 MG tablet Commonly known as:  TYLENOL Place 1,000 mg into feeding tube as needed for moderate pain.   amLODipine 10 MG tablet Commonly known as:  NORVASC Place 1 tablet (10 mg total) into feeding tube daily.   aspirin 81 MG chewable tablet Place 1 tablet (81 mg total) into feeding tube daily. Start taking on:  04/05/2017 Replaces:  aspirin 325 MG tablet   cloNIDine 0.1 mg/24hr patch Commonly known as:  CATAPRES - Dosed in mg/24 hr Place 1 patch (0.1 mg total) onto the skin once a week. Start taking on:  04/10/2017   clopidogrel 75 MG tablet Commonly known as:  PLAVIX Place 1 tablet (75 mg total) into feeding tube daily.   famotidine 40 MG tablet Commonly known as:  PEPCID Place 40 mg daily into feeding tube.   feeding supplement (JEVITY 1.2 CAL) Liqd Place 1,000 mLs into feeding tube continuous. What changed:  additional instructions   free water Soln Place 100 mLs into feeding tube every 4 (four)  hours. What changed:  when to take this   gabapentin 100 MG capsule Commonly known as:  NEURONTIN Take 100 mg See admin instructions by mouth. Open 1 capsule (100 mg) and administer contents via feeding tube three times daily   ipratropium-albuterol 0.5-2.5 (3) MG/3ML Soln Commonly known as:  DUONEB Take 3 mLs by nebulization every 4 (four) hours as needed. What changed:  reasons to take this   levofloxacin 500 MG tablet Commonly known as:  LEVAQUIN Take 1 tablet (500 mg total) by mouth daily. Start taking on:  04/05/2017   metoprolol tartrate 25 MG tablet Commonly known as:  LOPRESSOR Place 25 mg 2 (two) times daily into feeding tube.   pravastatin 20 MG tablet Commonly known as:  PRAVACHOL Place 1 tablet (20 mg total) into feeding tube daily at 6 PM. What changed:  when to take this   thiamine 100 MG tablet Place 1 tablet (100 mg total) into feeding tube daily.      No Known Allergies    The results of significant diagnostics from this hospitalization (including imaging, microbiology, ancillary and laboratory) are listed below for reference.    Significant Diagnostic Studies: Ct Head Wo Contrast  Result Date: 03/30/2017 CLINICAL DATA:  Altered level of consciousness EXAM: CT HEAD WITHOUT CONTRAST TECHNIQUE: Contiguous axial images were obtained from the base of the skull through the vertex without intravenous contrast. COMPARISON:  10/20/2016 FINDINGS: Brain: No evidence of acute infarction, hemorrhage, extra-axial collection, ventriculomegaly, or mass effect. Old large left cerebellar infarct with encephalomalacia. Old right cerebellar infarct with encephalomalacia. Left frontoparietal chronic infarct with encephalomalacia. Old left thalamic lacunar infarct. Generalized cerebral atrophy. Periventricular white matter low attenuation likely secondary to microangiopathy. Vascular: Cerebrovascular atherosclerotic calcifications are noted. Skull: Negative for fracture or focal  lesion. Sinuses/Orbits: Visualized portions of the orbits are unremarkable. Visualized portions of the paranasal sinuses and mastoid air cells are unremarkable. Other: None. IMPRESSION: 1. No acute intracranial pathology. Electronically Signed   By: Kathreen Devoid   On: 03/30/2017 12:10   Dg Chest Port 1 View  Result Date: 04/03/2017 CLINICAL DATA:  Elevated white blood cell count and productive cough. EXAM: PORTABLE CHEST 1 VIEW COMPARISON:  Single-view of the chest 04/01/2017 and 03/30/2017. FINDINGS: Tracheostomy tube is in place. New bibasilar airspace disease is much worse on the left. No pneumothorax. No pleural effusion. Heart size is normal. Aortic atherosclerosis is seen. Avascular necrosis left humeral head is noted. IMPRESSION: Left much worse than right basilar airspace disease worrisome for pneumonia is new since the most recent exam. Electronically Signed   By: Inge Rise M.D.   On: 04/03/2017 16:08   Dg Chest Port 1 View  Result Date: 04/01/2017 CLINICAL DATA:  66 year old male with cough. EXAM: PORTABLE CHEST 1 VIEW COMPARISON:  03/30/2017 and earlier. FINDINGS: Portable AP semi upright view at 0642 hours. Stable tracheostomy tube. Stable cardiac size and mediastinal contours. Stable lung volumes with increasing left retrocardiac and lung base opacity now partially obscuring the left hemidiaphragm. No pneumothorax or pulmonary edema. Allowing for portable technique the right lung is clear. Stable visualized osseous structures. Stable lower left chest wall surgical clips. Negative visible bowel gas pattern. IMPRESSION: 1. Increasing left lung base opacity could reflect atelectasis or pneumonia. 2. The right lung is clear. Electronically Signed   By: Genevie Ann M.D.   On: 04/01/2017 09:23   Dg Chest Portable 1 View  Result Date: 03/30/2017 CLINICAL DATA:  Cough and increasing tracheal secretions EXAM: PORTABLE CHEST 1 VIEW COMPARISON:  Chest radiograph March 02, 2017 and chest CT  March 02, 2017 FINDINGS: Tracheostomy catheter tip is 5.2 cm above the carina. There is currently no edema or consolidation. Heart is upper normal in size with pulmonary vascularity within normal limits. No adenopathy. There is degenerative change in each shoulder. 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 IMPRESSION: Tracheostomy as described without pneumothorax. No edema or consolidation. Cardiac silhouette within normal limits. 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 Electronically Signed   By: Lowella Grip III M.D.   On: 03/30/2017 11:49   Ir Patient Eval Tech 0-60 Mins  Result Date: 04/03/2017 Karenann Cai     04/03/2017  9:42 AM Unclogged Gastrostomy tube with Enteral feeding tube declogger.  Flushed tube vigorously with normal saline and replace port to tube.  No complications [place] Ravens RT R, VI   Microbiology: Recent Results (from the past 240 hour(s))  Blood culture (routine x 2)     Status: None (Preliminary result)   Collection Time: 03/30/17 11:42 AM  Result Value Ref Range Status   Specimen Description BLOOD LEFT FOREARM  Final   Special Requests   Final    BOTTLES DRAWN AEROBIC AND ANAEROBIC Blood Culture adequate volume   Culture NO GROWTH 4 DAYS  Final   Report Status PENDING  Incomplete  Urine culture     Status: Abnormal   Collection Time: 03/30/17 12:09 PM  Result Value Ref Range Status   Specimen Description URINE, CATHETERIZED  Final   Special Requests NONE  Final   Culture 80,000 COLONIES/mL YEAST (A)  Final   Report Status 03/31/2017 FINAL  Final  Blood culture (routine x 2)     Status: None (Preliminary result)   Collection Time: 03/30/17  1:02 PM  Result Value Ref Range Status   Specimen Description BLOOD RIGHT FOREARM  Final   Special Requests IN PEDIATRIC BOTTLE Blood Culture adequate volume  Final   Culture NO GROWTH 4 DAYS  Final   Report Status PENDING  Incomplete  Culture, respiratory (NON-Expectorated)      Status: None   Collection Time: 03/30/17  4:06 PM  Result Value Ref Range Status   Specimen Description TRACHEAL ASPIRATE  Final   Special Requests NONE  Final   Gram Stain   Final    ABUNDANT WBC PRESENT,BOTH PMN AND MONONUCLEAR MODERATE GRAM POSITIVE RODS MODERATE GRAM NEGATIVE RODS    Culture   Final    MODERATE PSEUDOMONAS AERUGINOSA ABUNDANT STENOTROPHOMONAS [place]    Report Status 04/03/2017 FINAL  Final   Organism ID, Bacteria PSEUDOMONAS AERUGINOSA  Final   Organism ID, Bacteria STENOTROPHOMONAS MALTOPHILIA  Final      Susceptibility   Pseudomonas aeruginosa - MIC*    CEFTAZIDIME 16 INTERMEDIATE Intermediate     CIPROFLOXACIN 1 SENSITIVE Sensitive     GENTAMICIN 8 INTERMEDIATE Intermediate     IMIPENEM 2 SENSITIVE Sensitive     PIP/TAZO 32 SENSITIVE Sensitive     CEFEPIME 16 INTERMEDIATE Intermediate     * MODERATE PSEUDOMONAS AERUGINOSA   Stenotrophomonas maltophilia - MIC*    LEVOFLOXACIN 1 SENSITIVE Sensitive     TRIMETH/SULFA <=20 SENSITIVE Sensitive     * ABUNDANT STENOTROPHOMONAS MALTOPHILIA  MRSA PCR Screening     Status: None   Collection Time: 03/31/17  8:37 AM  Result Value Ref Range Status   MRSA by PCR NEGATIVE NEGATIVE Final    Comment:        The GeneXpert MRSA Assay (FDA approved for NASAL specimens only), is one component of a comprehensive MRSA colonization surveillance program. It is not intended to diagnose MRSA infection nor to guide or monitor treatment for MRSA infections.      Labs: Basic Metabolic Panel: Recent Labs  Lab 03/30/17 1302 03/30/17 2156 04/01/17 0515 04/03/17 1138 04/04/17 0348  NA 144 138 140 138 139  K 5.8* 4.0 4.5 3.7 4.8  CL 107 108 105 106 101  CO2 20* 21* 22 23 23   GLUCOSE 83 87 69 82 84  BUN 28* 22* 16 16 20   CREATININE 1.15 0.96 0.84 1.05 1.11  CALCIUM 11.0* 10.7* 10.6* 10.8* 10.8*   Liver Function Tests: Recent Labs  Lab 03/30/17 1302 04/03/17 1138 04/04/17 0348  AST 34 14* 20  ALT 22 17  18   ALKPHOS 100 82  83  BILITOT 0.8 0.6 0.3  PROT 9.0* 7.9 8.1  ALBUMIN 3.2* 2.6* 2.6*   Recent Labs  Lab 03/30/17 1302  LIPASE 23   No results for input(s): AMMONIA in the last 168 hours. CBC: Recent Labs  Lab 03/30/17 1302 03/31/17 0941 04/01/17 0515 04/02/17 0324 04/03/17 1138 04/04/17 0348  WBC 16.8* 15.6* 14.5* 11.6* 12.6* 8.5  NEUTROABS 11.5*  --   --   --   --  4.9  HGB 12.0* 9.8* 11.0* 11.7* 10.6* 11.7*  HCT 37.5* 32.1* 35.2* 36.7* 34.0* 37.4*  MCV 88.2 90.4 89.1 88.4 87.2 89.0  PLT 681* 618* 626* 523* 638* 598*   Cardiac Enzymes: Recent Labs  Lab 03/30/17 1302  CKTOTAL 98   BNP: BNP (last 3 results) Recent Labs    12/09/16 0351 03/02/17 1643 03/30/17 1302  BNP 18.0 11.8 18.0    ProBNP (last 3 results) No results for input(s): PROBNP in the last 8760 hours.  CBG: Recent Labs  Lab 04/03/17 1651 04/03/17 1956 04/04/17 0007 04/04/17 0447 04/04/17 0754  GLUCAP 111* 104* 93 95 95       Signed:  Nita Sells MD   [facility] 04/04/2017, 10:10 AM

## 2017-04-04 NOTE — Care Management Note (Signed)
Case Management Note  Patient Details  Name: Scott Rivera MRN: [id number] Date of Birth: 08/26/1950  Subjective/Objective:        Bronchitis/ PNA.          [name] (Sister) Marzetta Board (Mother)    [phone]      Action/Plan: Transitioning to home today. [facility] called and pt pick arranged for transportation to home.  Expected Discharge Date:  04/04/17               Expected Discharge Plan:  [place]  In-House Referral:     Discharge planning Services     Post Acute Care Choice:  Resumption of Svcs/PTA Provider Choice offered to:  Patient  DME Arranged:    DME Agency:     HH Arranged:  PT, RN, Speech Therapy HH Agency:  [facility]  Status of Service:  Completed, signed off  If discussed at [place] of Stay Meetings, dates discussed:    Additional Comments:  Sharin Mons, RN 04/04/2017, 12:55 PM

## 2017-04-04 NOTE — Progress Notes (Signed)
NURSING PROGRESS NOTE  PRIMUS GRITTON [id number] Discharge Data: 04/04/2017 2:24 PM Attending Provider: No att. providers found [id number], No Pcp Per     Bridget Hartshorn to be D/C'd Home per MD order.  Discussed with the patient the After Visit Summary and all questions fully answered. All IV's discontinued with no bleeding noted. All belongings returned to patient for patient to take home.   Last Vital Signs:  Blood pressure 125/70, pulse 77, temperature 97.9 F (36.6 C), temperature source Oral, resp. rate 20, height 5\' 6"  (1.676 m), weight 64.2 kg (141 lb 8.6 oz), SpO2 100 %.  Discharge Medication List Allergies as of 04/04/2017   No Known Allergies     Medication List    STOP taking these medications   aspirin 325 MG tablet Replaced by:  aspirin 81 MG chewable tablet   cloNIDine 0.1 MG tablet Commonly known as:  CATAPRES   mouth rinse Liqd solution     TAKE these medications   acetaminophen 500 MG tablet Commonly known as:  TYLENOL Place 1,000 mg into feeding tube as needed for moderate pain.   amLODipine 10 MG tablet Commonly known as:  NORVASC Place 1 tablet (10 mg total) into feeding tube daily.   aspirin 81 MG chewable tablet Place 1 tablet (81 mg total) into feeding tube daily. Start taking on:  04/05/2017 Replaces:  aspirin 325 MG tablet   cloNIDine 0.1 mg/24hr patch Commonly known as:  CATAPRES - Dosed in mg/24 hr Place 1 patch (0.1 mg total) onto the skin once a week. Start taking on:  04/10/2017   clopidogrel 75 MG tablet Commonly known as:  PLAVIX Place 1 tablet (75 mg total) into feeding tube daily.   famotidine 40 MG tablet Commonly known as:  PEPCID Place 40 mg daily into feeding tube.   feeding supplement (JEVITY 1.2 CAL) Liqd Place 1,000 mLs into feeding tube continuous. What changed:  additional instructions   free water Soln Place 100 mLs into feeding tube every 4 (four) hours. What changed:  when to take this   gabapentin 100  MG capsule Commonly known as:  NEURONTIN Take 100 mg See admin instructions by mouth. Open 1 capsule (100 mg) and administer contents via feeding tube three times daily   ipratropium-albuterol 0.5-2.5 (3) MG/3ML Soln Commonly known as:  DUONEB Take 3 mLs by nebulization every 4 (four) hours as needed. What changed:  reasons to take this   levofloxacin 500 MG tablet Commonly known as:  LEVAQUIN Take 1 tablet (500 mg total) by mouth daily. Start taking on:  04/05/2017   metoprolol tartrate 25 MG tablet Commonly known as:  LOPRESSOR Place 25 mg 2 (two) times daily into feeding tube.   pravastatin 20 MG tablet Commonly known as:  PRAVACHOL Place 1 tablet (20 mg total) into feeding tube daily at 6 PM. What changed:  when to take this   thiamine 100 MG tablet Place 1 tablet (100 mg total) into feeding tube daily.     Patient transported via [place]. [facility] nurse came by to see sister before he left. Condom cath to go with him, they will care for it outpatient.

## 2017-04-25 DIAGNOSIS — R451 Restlessness and agitation: Secondary | ICD-10-CM | POA: Insufficient documentation

## 2017-06-20 ENCOUNTER — Emergency Department (HOSPITAL_BASED_OUTPATIENT_CLINIC_OR_DEPARTMENT_OTHER)
Admission: EM | Admit: 2017-06-20 | Discharge: 2017-06-20 | Disposition: A | Discharge status: Home / Self Care | Payer: Medicare HMO | Attending: Emergency Medicine | Admitting: Emergency Medicine

## 2017-06-20 ENCOUNTER — Emergency Department (HOSPITAL_BASED_OUTPATIENT_CLINIC_OR_DEPARTMENT_OTHER): Payer: Medicare HMO

## 2017-06-20 ENCOUNTER — Encounter (HOSPITAL_BASED_OUTPATIENT_CLINIC_OR_DEPARTMENT_OTHER): Payer: Self-pay | Admitting: Emergency Medicine

## 2017-06-20 DIAGNOSIS — Z87891 Personal history of nicotine dependence: Secondary | ICD-10-CM | POA: Diagnosis not present

## 2017-06-20 DIAGNOSIS — R369 Urethral discharge, unspecified: Secondary | ICD-10-CM | POA: Diagnosis present

## 2017-06-20 DIAGNOSIS — Z79899 Other long term (current) drug therapy: Secondary | ICD-10-CM | POA: Insufficient documentation

## 2017-06-20 DIAGNOSIS — Z7982 Long term (current) use of aspirin: Secondary | ICD-10-CM | POA: Diagnosis not present

## 2017-06-20 DIAGNOSIS — I129 Hypertensive chronic kidney disease with stage 1 through stage 4 chronic kidney disease, or unspecified chronic kidney disease: Secondary | ICD-10-CM | POA: Insufficient documentation

## 2017-06-20 DIAGNOSIS — N183 Chronic kidney disease, stage 3 (moderate): Secondary | ICD-10-CM | POA: Diagnosis not present

## 2017-06-20 DIAGNOSIS — N1330 Unspecified hydronephrosis: Secondary | ICD-10-CM | POA: Diagnosis not present

## 2017-06-20 LAB — CBC WITH DIFFERENTIAL/PLATELET
Basophils Absolute: 0 10*3/uL (ref 0.0–0.1)
Basophils Relative: 0 %
Eosinophils Absolute: 0.5 10*3/uL (ref 0.0–0.7)
Eosinophils Relative: 4 %
HCT: 37.2 % — ABNORMAL LOW (ref 39.0–52.0)
Hemoglobin: 11.8 g/dL — ABNORMAL LOW (ref 13.0–17.0)
Lymphocytes Relative: 11 %
Lymphs Abs: 1.6 10*3/uL (ref 0.7–4.0)
MCH: 28 pg (ref 26.0–34.0)
MCHC: 31.7 g/dL (ref 30.0–36.0)
MCV: 88.4 fL (ref 78.0–100.0)
Monocytes Absolute: 2.2 10*3/uL — ABNORMAL HIGH (ref 0.1–1.0)
Monocytes Relative: 15 %
Neutro Abs: 10.7 10*3/uL — ABNORMAL HIGH (ref 1.7–7.7)
Neutrophils Relative %: 70 %
Platelets: 522 10*3/uL — ABNORMAL HIGH (ref 150–400)
RBC: 4.21 MIL/uL — ABNORMAL LOW (ref 4.22–5.81)
RDW: 17 % — ABNORMAL HIGH (ref 11.5–15.5)
WBC: 15 10*3/uL — ABNORMAL HIGH (ref 4.0–10.5)

## 2017-06-20 LAB — COMPREHENSIVE METABOLIC PANEL
ALT: 65 U/L — ABNORMAL HIGH (ref 17–63)
AST: 49 U/L — ABNORMAL HIGH (ref 15–41)
Albumin: 3.1 g/dL — ABNORMAL LOW (ref 3.5–5.0)
Alkaline Phosphatase: 134 U/L — ABNORMAL HIGH (ref 38–126)
Anion gap: 9 (ref 5–15)
BUN: 39 mg/dL — ABNORMAL HIGH (ref 6–20)
CO2: 30 mmol/L (ref 22–32)
Calcium: 10.3 mg/dL (ref 8.9–10.3)
Chloride: 97 mmol/L — ABNORMAL LOW (ref 101–111)
Creatinine, Ser: 1.06 mg/dL (ref 0.61–1.24)
GFR calc Af Amer: >60 mL/min (ref >60)
GFR calc non Af Amer: >60 mL/min (ref >60)
Glucose, Bld: 84 mg/dL (ref 65–99)
Potassium: 4.5 mmol/L (ref 3.5–5.1)
Sodium: 136 mmol/L (ref 135–145)
Total Bilirubin: 0.2 mg/dL — ABNORMAL LOW (ref 0.3–1.2)
Total Protein: 8.7 g/dL — ABNORMAL HIGH (ref 6.5–8.1)

## 2017-06-20 LAB — URINALYSIS, ROUTINE W REFLEX MICROSCOPIC
Bilirubin Urine: NEGATIVE
Glucose, UA: NEGATIVE mg/dL
Ketones, ur: NEGATIVE mg/dL
Nitrite: NEGATIVE
Protein, ur: NEGATIVE mg/dL
Specific Gravity, Urine: 1.01 (ref 1.005–1.030)
pH: 6 (ref 5.0–8.0)

## 2017-06-20 LAB — URINALYSIS, MICROSCOPIC (REFLEX)

## 2017-06-20 LAB — LIPASE, BLOOD: Lipase: 24 U/L (ref 11–51)

## 2017-06-20 MED ORDER — IOPAMIDOL (ISOVUE-300) INJECTION 61%
100.0000 mL | Freq: Once | INTRAVENOUS | Status: AC | PRN
  Administered 2017-06-20: 100 mL via INTRAVENOUS

## 2017-06-20 MED ORDER — IOPAMIDOL (ISOVUE-300) INJECTION 61%: 100.0000 mL | Freq: Once | INTRAVENOUS | Status: DC | PRN

## 2017-06-20 MED ORDER — LEVOFLOXACIN 750 MG PO TABS: 750.0000 mg | ORAL_TABLET | Freq: Every day | ORAL | 0 refills | Status: DC

## 2017-06-20 MED ORDER — POLYETHYLENE GLYCOL 3350 17 G PO PACK: 17.0000 g | PACK | Freq: Every day | ORAL | 0 refills | Status: DC

## 2017-06-20 NOTE — ED Provider Notes (Signed)
[facility] Provider Note   CSN: [id number] Arrival date & time: 06/20/17  1514     History   Chief Complaint Chief Complaint  Patient presents with  . Drainage from Incision    HPI Scott Rivera is a 67 y.o. male.  HPI  Scott Rivera is a 67yo male with a history of cerebellar infarct (has a chronic G-tube, tracheostomy tube, and indwelling foley catheter), recurrent aspiration pneumonia, HTN, CKD stage III, HLD who presents to the emergency department with his sister for evaluation of blood per penis and pus expressed from g-tube. Patient is non-verbal, he occasionally nods yes and no. Level 5 caveat applies given patient non-verbal. Per patient's sister who is his primary caregiver, patient has had yellow pus coming out of his G-tube site for about a week now. She has been using Q-tip with hydrogen peroxide over the skin. Was prescribed doxycycline by his PCP for a few days to treat the infection, but she states it was discontinued for unknown reasons. Today when she was bathing the patient, his foley catheter came out and she noticed blood at the tip of the penis which was "dripping out persistently." She was concerned given he is on Plavix and brought him in to the ER. Lastly, she reports that he has had grey mucous coming out of his tracheostomy tube. States that this has been ongoing for a week now. She checks his pulse ox at home where he is on 28% O2 and reports that his sats have been normal (between 92-96%.) She reports that he has not had a fever.  Past Medical History:  Diagnosis Date  . Anemia due to chronic kidney disease   . Aspiration pneumonia ([place])   . Chronic kidney disease, stage 3 (moderate) (HCC)   . Hypertension   . Stroke [facility])     Patient Active Problem List   Diagnosis Date Noted  . Bronchitis/??? Pneumonia 03/30/2017  . S/P percutaneous endoscopic gastrostomy (PEG) tube placement ([place]) 03/30/2017  . Chronic anemia 03/30/2017    . Foley catheter in place on admission 03/30/2017  . UTI (urinary tract infection) 03/30/2017  . MDR Acinetobacter baumannii infection   . Gastrostomy tube obstruction ([place])   . PEG tube malfunction ([place])   . Acute respiratory failure with hypoxia ([place]) 03/02/2017  . Loculated pleural effusion   . Acute respiratory distress   . Fever   . Follow up   . HCAP (healthcare-associated pneumonia)   . Stage III pressure ulcer of sacral region ([place]) 12/17/2016  . Hypoxemia   . H/O tracheostomy   . Pneumothorax   . Pneumothorax, closed, traumatic, initial encounter 12/10/2016  . Sepsis ([place]) 12/09/2016  . Hypotension 12/09/2016  . Acute-on-chronic kidney injury ([place]) 12/09/2016  . Elevated troponin 12/09/2016  . Anemia due to chronic kidney disease 12/09/2016  . Compensated metabolic alkalosis 51/76/1607  . Aspiration pneumonia ([place])   . Goals of care, counseling/discussion   . Palliative care by specialist   . Respiratory failure ([place])   . Aspiration into airway   . Septic shock ([place])   . Dysphagia, post-stroke   . Benign essential HTN   . Tobacco abuse   . Tachypnea   . Hyperglycemia   . Hypernatremia   . Leukocytosis (leucocytosis)   . Acute blood loss anemia   . Chronic kidney disease   . Pressure injury of skin 10/20/2016  . Cerebellar infarct ([place]) 10/19/2016  . ARF (acute renal failure) ([place]) 10/19/2016  .  Essential hypertension 10/19/2016  . Rhabdomyolysis 10/19/2016  . Acute ischemic stroke ([place]) 10/19/2016  . Bilateral chronic knee pain 01/07/2016  . History of adenomatous polyp of colon 07/28/2015  . Hyperlipidemia 07/28/2013  . Helicobacter pylori infection 09/21/2012    Past Surgical History:  Procedure Laterality Date  . IR GASTROSTOMY TUBE MOD SED  10/27/2016  . IR PATIENT EVAL TECH 0-60 MINS  01/05/2017  . IR PATIENT EVAL TECH 0-60 MINS  01/09/2017  . IR PATIENT EVAL TECH 0-60 MINS  03/03/2017  . IR PATIENT EVAL TECH 0-60 MINS  04/03/2017  . KNEE SURGERY     . TRACHEOSTOMY TUBE PLACEMENT         Home Medications    Prior to Admission medications   Medication Sig Start Date End Date Taking? Authorizing Provider  acetaminophen (TYLENOL) 500 MG tablet Place 1,000 mg into feeding tube as needed for moderate pain.     [provider]  amLODipine (NORVASC) 10 MG tablet Place 1 tablet (10 mg total) into feeding tube daily. 01/11/17   Cherene Altes, MD  aspirin 81 MG chewable tablet Place 1 tablet (81 mg total) into feeding tube daily. 04/05/17   Nita Sells, MD  cloNIDine (CATAPRES - DOSED IN MG/24 HR) 0.1 mg/24hr patch Place 1 patch (0.1 mg total) onto the skin once a week. 04/10/17   Nita Sells, MD  clopidogrel (PLAVIX) 75 MG tablet Place 1 tablet (75 mg total) into feeding tube daily. 01/11/17   Cherene Altes, MD  famotidine (PEPCID) 40 MG tablet Place 40 mg daily into feeding tube.    [provider]  gabapentin (NEURONTIN) 100 MG capsule Take 100 mg See admin instructions by mouth. Open 1 capsule (100 mg) and administer contents via feeding tube three times daily    [provider]  ipratropium-albuterol (DUONEB) 0.5-2.5 (3) MG/3ML SOLN Take 3 mLs by nebulization every 4 (four) hours as needed. Patient taking differently: Take 3 mLs by nebulization every 4 (four) hours as needed (shortness of breath/wheezing).  01/10/17   Cherene Altes, MD  levofloxacin (LEVAQUIN) 500 MG tablet Take 1 tablet (500 mg total) by mouth daily. 04/05/17   Nita Sells, MD  metoprolol tartrate (LOPRESSOR) 25 MG tablet Place 25 mg 2 (two) times daily into feeding tube.     [provider]  Nutritional Supplements (FEEDING SUPPLEMENT, JEVITY 1.2 CAL,) LIQD Place 1,000 mLs into feeding tube continuous. Patient taking differently: Place 1,000 mLs continuous into feeding tube. 60 ml/hr 01/10/17   Cherene Altes, MD  pravastatin (PRAVACHOL) 20 MG tablet Place 1 tablet (20 mg total) into feeding tube  daily at 6 PM. Patient taking differently: Place 20 mg daily into feeding tube.  01/10/17   Cherene Altes, MD  thiamine 100 MG tablet Place 1 tablet (100 mg total) into feeding tube daily. 01/11/17   Cherene Altes, MD  Water For Irrigation, Sterile (FREE WATER) SOLN Place 100 mLs into feeding tube every 4 (four) hours. 04/04/17   Nita Sells, MD    Family History Family History  Problem Relation Age of Onset  . Diabetes Mellitus II Brother   . CAD Neg Hx   . Stroke Neg Hx     Social History Social History   Tobacco Use  . Smoking status: Former Research scientist (life sciences)  . Smokeless tobacco: Never Used  . Tobacco comment: Smoked heavily until the day of his stroke  Substance Use Topics  . Alcohol use: Yes    Comment:  Drank heavily prior to stroke per sister  . Drug use: Not on file     Allergies   Patient has no known allergies.   Review of Systems Review of Systems  Unable to perform ROS: Patient nonverbal     Physical Exam Updated Vital Signs BP 124/65 (BP Location: Right Arm)   Pulse 78   Temp 97.6 F (36.4 C) (Oral)   Resp 18   SpO2 94% Comment: 6 l trach coller humidified  Physical Exam  Constitutional: He appears well-developed and well-nourished. No distress.  HENT:  Head: Normocephalic and atraumatic.  Mucous membranes moist  Eyes: Conjunctivae are normal. Pupils are equal, round, and reactive to light. Right eye exhibits no discharge. Left eye exhibits no discharge.  Neck:  Tracheostomy in place, clear secretions present in the tube.  Cardiovascular: Normal rate and regular rhythm. Exam reveals no friction rub.  No murmur heard. Pulmonary/Chest: Effort normal. No respiratory distress.  No respiratory distress. On 4L O2 mask over tracheostomy tube. Rhonchi heard grossly in bilateral upper and lower lung fields. No wheezes, rales or stridor.   Abdominal:  Gtube present in left mid quadrant. There is mild thin yellow drainage over the site. No  surrounding erythema, warmth or induration. Abdomen soft. No tenderness to palpation.   Genitourinary:  Genitourinary Comments: Chaperone present for exam. Drop of blood present at the tip of the penis. No erythema on the penis or testicles. No testicular masses or swelling.  Musculoskeletal: Normal range of motion.  Neurological:  Non-verbal. Nods "yes" and "no." Moving all four extremities.   Skin: Skin is warm and dry. Capillary refill takes less than 2 seconds. He is not diaphoretic.  No decubitus ulcer.   Psychiatric: He has a normal mood and affect. His behavior is normal.  Nursing note and vitals reviewed.    ED Treatments / Results  Labs (all labs ordered are listed, but only abnormal results are displayed) Labs Reviewed  URINALYSIS, ROUTINE W REFLEX MICROSCOPIC - Abnormal; Notable for the following components:      Result Value   APPearance CLOUDY (*)    Hgb urine dipstick MODERATE (*)    Leukocytes, UA LARGE (*)    All other components within normal limits  CBC WITH DIFFERENTIAL/PLATELET - Abnormal; Notable for the following components:   WBC 15.0 (*)    RBC 4.21 (*)    Hemoglobin 11.8 (*)    HCT 37.2 (*)    RDW 17.0 (*)    Platelets 522 (*)    Neutro Abs 10.7 (*)    Monocytes Absolute 2.2 (*)    All other components within normal limits  COMPREHENSIVE METABOLIC PANEL - Abnormal; Notable for the following components:   Chloride 97 (*)    BUN 39 (*)    Total Protein 8.7 (*)    Albumin 3.1 (*)    AST 49 (*)    ALT 65 (*)    Alkaline Phosphatase 134 (*)    Total Bilirubin 0.2 (*)    All other components within normal limits  URINALYSIS, MICROSCOPIC (REFLEX) - Abnormal; Notable for the following components:   Bacteria, UA FEW (*)    Squamous Epithelial / LPF 6-30 (*)    All other components within normal limits  URINE CULTURE  LIPASE, BLOOD    EKG  EKG Interpretation None       Radiology Dg Chest 2 View  Result Date: 06/20/2017 CLINICAL DATA:  Pearline Cables  phlegm from G-tube. Aspiration pneumonia, infarct, hypertension, former  smoker. EXAM: CHEST - 2 VIEW COMPARISON:  04/03/2017 FINDINGS: Heart is top-normal in size. There is mild aortic atherosclerosis at the arch. Redemonstration of bibasilar airspace opacities likely representing areas of atelectasis. Small foci of pneumonia are not excluded especially at the right lung base given its slightly more confluent appearance. Trace left effusion blunting the left costophrenic angle. Two surgical clips project over the left lower chest wall and 3 overlying the left lung apex. Tracheostomy tube tip terminates in the superior mediastinum centered over the trachea. No acute osseous abnormality. Right coracoclavicular ligament ossification is seen. IMPRESSION: 1. Redemonstration of bibasilar airspace opacities, more likely representing atelectasis. However given the slightly more confluent appearance at the right lung base medially, findings could potentially represent stigmata of aspiration pneumonia. 2. Minimal aortic atherosclerosis. Electronically Signed   By: Ashley Royalty M.D.   On: 06/20/2017 19:04   Ct Abdomen Pelvis W Contrast  Result Date: 06/20/2017 CLINICAL DATA:  67 y/o M; history of stroke with Foley, G-tube, and trach. Patient pole Foley and there is drainage around the G-tube. EXAM: CT ABDOMEN AND PELVIS WITH CONTRAST TECHNIQUE: Multidetector CT imaging of the abdomen and pelvis was performed using the standard protocol following bolus administration of intravenous contrast. CONTRAST:  187mL ISOVUE-300 IOPAMIDOL (ISOVUE-300) INJECTION 61% COMPARISON:  10/26/2016 CT abdomen and pelvis. FINDINGS: Lower chest: Small left pleural effusion. Coronary artery calcifications. Hepatobiliary: Subcentimeter lucency in liver segment 4B, probably cysts. Granuloma in left lobe of liver. Normal gallbladder. No biliary ductal dilatation. Pancreas: Unremarkable. No pancreatic ductal dilatation or surrounding inflammatory  changes. Spleen: Normal in size without focal abnormality. Adrenals/Urinary Tract: Atrophic left kidney. Normal adrenal glands. No focal right kidney lesion. Mild motion artifact through the right kidney. Mild right hydronephrosis, no obstructing stone or mass identified. Ureter enlargement may be due to large volume of stool in the rectum with mass effect and pelvis. Foley catheter balloon within a decompressed bladder. Stomach/Bowel: Gastrostomy tube within gastric body. No fluid collection within the anterior abdominal wall along the course of gastrostomy tube. Large volume of stool throughout colon with significant stool in rectum. No obstructive or inflammatory changes of the bowel. Vascular/Lymphatic: Aortic atherosclerosis. No enlarged abdominal or pelvic lymph nodes. Reproductive: Prostate is unremarkable. Other: Midline postsurgical changes in the anterior abdominal wall are unchanged. No ascites. Musculoskeletal: Mild S-shaped curvature of the lumbar spine and multilevel disc greater than facet degenerative changes. Severe degenerative changes of the right-greater-than-left hip joints. 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 No acute fracture identified. IMPRESSION: 1. Gastrostomy balloon within gastric body. No fluid collection in anterior abdominal wall along course of gastrostomy tube. 2. Foley catheter balloon within the bladder. 3. Small left pleural effusion. 4. Large volume of stool throughout colon with significant stool in rectum may represent constipation. 5. Mild right hydronephrosis, no obstructing stone identified. Findings may be due to mass effect from large rectal stool burden. 6. Aortic atherosclerosis. Electronically Signed   By: Kristine Garbe M.D.   On: 06/20/2017 18:44    Procedures Procedures (including critical care time)  Medications Ordered in ED Medications  iopamidol (ISOVUE-300) 61 % injection 100 mL (100 mLs Intravenous Contrast Given 06/20/17  1747)     Initial Impression / Assessment and Plan / ED Course  I have reviewed the triage vital signs and the nursing notes.  Pertinent labs & imaging results that were available during my care of the patient were reviewed by me and considered in my medical decision making (see chart for details).  Patient with a complex medical history presents with his sister for evaluation of g-tube drainage, blood per penis and grey secretions out of tracheostomy.   On exam he is afebrile and non-toxic appearing. Mild thin secretions at the site of the g-tube. No surrounding erythema, warmth or induration. Patient does not flinch or seem to be in pain with palpation of the abdomen. Patient is on home oxygen, O2sats good. No respiratory distress.  Lungs with rhonchi bilaterally.   Given patient is nonverbal, plan to get basic labs and CT abdomen/pelvis to check for any deep space infection given family's concern for drainage around the g-tube. Will also get CXR given history of grey secretions from the tracheostomy. Foley catheter placed in the emergency department. Small clot seen with initial placement, but no further bleeding. Urine pending.   Results reviewed. CXR reveals redemonstration of bilateral airspace opacities, likely atelectasis. There is slightly more confluence at the right lower lung base which may represent aspiration pneumonia. He has a leukocytosis with shift to the left (WBC 15, neutrophils 10.7). Hgb 11.8, baseline. CMP reveals small elevation in AST and ALT (49, 65 respectively). Do not think that this is contributing to his ER visit today and have counseled sister at bedside that he can have this rechecked with PCP. UA reveals WBC TNTC with few bacteria and several ramus epithelial cells.  This is persistent from his previous visit two months ago, will send for culture. Lipase negative.   CT abdomen/pelvis reveals large stool burden and right mild hydronephrosis. Kidney function good,  creatinine at baseline. No abdominal abscess on CT.   Discussed this patient with Dr. Billy Fischer who also saw the patient and suggests Levaquin for potential aspiration pneumonia. Patient's VSS, no concern for sepsis. Counseled sister that we plan to start Levaquin antibiotic.   Thin secretions along the gtube site are likely gastric secretions, have counseled sister at bedside how to keep the area clean and dry. Sister had concern for obstruction of the gtube and I observed nursing staff at bedside easily flush g-tube with 60cc's of fluid. Started patient on miralax bowel regimen for constipation seen on CT. Have discussed right hydronephrosis with patient's sister and given her information to follow up with urology. Discussed return precautions with family who agree to plan and appear reliable to follow up. Discussed plan with Dr. Billy Fischer who agrees.    Final Clinical Impressions(s) / ED Diagnoses   Final diagnoses:  Bloody drainage from penis    ED Discharge Orders        Ordered    polyethylene glycol (MIRALAX) packet  Daily     06/20/17 2044    levofloxacin (LEVAQUIN) 750 MG tablet  Daily     06/20/17 2044       Glyn Ade, PA-C 06/21/17 3354    Gareth Morgan, MD 06/21/17 1301

## 2017-06-20 NOTE — ED Notes (Signed)
Peg tub flushed 60 ml water without difficulty. Pt suctioned by RT. Peg tube site cleaned with sterile water.

## 2017-06-20 NOTE — ED Notes (Signed)
resp into suction trach several times for tan white secreations

## 2017-06-20 NOTE — ED Notes (Signed)
Contacted [facility] for transport back to patient's home

## 2017-06-20 NOTE — ED Notes (Signed)
ED Provider at bedside. 

## 2017-06-20 NOTE — ED Notes (Signed)
ED Provider at bedside discussing test results and detailed discharge instructions with pts caregiver.

## 2017-06-20 NOTE — ED Notes (Signed)
To ct, pt suctioned again per resp

## 2017-06-20 NOTE — Discharge Instructions (Signed)
Please have your brother start Levaquin antibiotic daily for the next 7 days.  Clean G-tube site with warm compresses daily.  He has mild swelling of the right kidney.  Please follow-up with kidney doctor.  Information listed below.  Scan showed that he was constipated.  Please start half capful of MiraLAX daily through the G-tube to help move stool.  Return to the emergency department if he has fever greater than 100.4 F or has any new or worsening symptoms.

## 2017-06-20 NOTE — ED Triage Notes (Signed)
Pt has hx of stroke with Foley, Gtube and trach,   Per [facility] during routine care today they pulled out his foley ( 10 am ), per daughter pt has had some drainage around Gtube,  Pt is awake and can shake head yes and no to9 questions , pt has contractions of hands  , can move arms, per sister pt does not walk has boots soft on both feet

## 2017-06-20 NOTE — ED Notes (Signed)
Pt cleaned of stool and new 16 foley placed, with return of cloudy yellow with small amt blood clots, new diaper placed on pt

## 2017-06-20 NOTE — ED Notes (Signed)
[facility] here for transport. 

## 2017-06-20 NOTE — ED Notes (Signed)
Patient arrived from home with trach. 4.0 cuffless shiley secured. Currently on 30% Fio2, wears 28% at home. SAT 95%. Suctioned for moderate amount white secretions. Trach cleaned and dressing changed

## 2017-06-21 LAB — URINE CULTURE

## 2017-06-26 ENCOUNTER — Emergency Department (HOSPITAL_COMMUNITY): Payer: Medicare HMO

## 2017-06-26 ENCOUNTER — Inpatient Hospital Stay (HOSPITAL_COMMUNITY)
Admission: EM | Admit: 2017-06-26 | Discharge: 2017-07-03 | DRG: 193 | Disposition: A | Discharge status: Home / Self Care | Payer: Medicare HMO | Attending: Internal Medicine | Admitting: Internal Medicine

## 2017-06-26 DIAGNOSIS — Z930 Tracheostomy status: Secondary | ICD-10-CM

## 2017-06-26 DIAGNOSIS — N183 Chronic kidney disease, stage 3 (moderate): Secondary | ICD-10-CM | POA: Diagnosis present

## 2017-06-26 DIAGNOSIS — Z8673 Personal history of transient ischemic attack (TIA), and cerebral infarction without residual deficits: Secondary | ICD-10-CM

## 2017-06-26 DIAGNOSIS — Z931 Gastrostomy status: Secondary | ICD-10-CM

## 2017-06-26 DIAGNOSIS — E875 Hyperkalemia: Secondary | ICD-10-CM | POA: Diagnosis present

## 2017-06-26 DIAGNOSIS — Z87891 Personal history of nicotine dependence: Secondary | ICD-10-CM

## 2017-06-26 DIAGNOSIS — J189 Pneumonia, unspecified organism: Secondary | ICD-10-CM | POA: Diagnosis not present

## 2017-06-26 DIAGNOSIS — Z8614 Personal history of Methicillin resistant Staphylococcus aureus infection: Secondary | ICD-10-CM

## 2017-06-26 DIAGNOSIS — I129 Hypertensive chronic kidney disease with stage 1 through stage 4 chronic kidney disease, or unspecified chronic kidney disease: Secondary | ICD-10-CM | POA: Diagnosis present

## 2017-06-26 DIAGNOSIS — Z7982 Long term (current) use of aspirin: Secondary | ICD-10-CM

## 2017-06-26 DIAGNOSIS — I69320 Aphasia following cerebral infarction: Secondary | ICD-10-CM

## 2017-06-26 DIAGNOSIS — R319 Hematuria, unspecified: Secondary | ICD-10-CM | POA: Diagnosis present

## 2017-06-26 DIAGNOSIS — Y95 Nosocomial condition: Secondary | ICD-10-CM | POA: Diagnosis present

## 2017-06-26 DIAGNOSIS — D631 Anemia in chronic kidney disease: Secondary | ICD-10-CM | POA: Diagnosis present

## 2017-06-26 DIAGNOSIS — I10 Essential (primary) hypertension: Secondary | ICD-10-CM | POA: Diagnosis present

## 2017-06-26 DIAGNOSIS — R310 Gross hematuria: Secondary | ICD-10-CM | POA: Diagnosis present

## 2017-06-26 DIAGNOSIS — R0902 Hypoxemia: Secondary | ICD-10-CM

## 2017-06-26 DIAGNOSIS — Z79899 Other long term (current) drug therapy: Secondary | ICD-10-CM

## 2017-06-26 DIAGNOSIS — J9601 Acute respiratory failure with hypoxia: Secondary | ICD-10-CM | POA: Diagnosis present

## 2017-06-26 LAB — COMPREHENSIVE METABOLIC PANEL
ALT: 35 U/L (ref 17–63)
AST: 43 U/L — ABNORMAL HIGH (ref 15–41)
Albumin: 2.8 g/dL — ABNORMAL LOW (ref 3.5–5.0)
Alkaline Phosphatase: 132 U/L — ABNORMAL HIGH (ref 38–126)
Anion gap: 11 (ref 5–15)
BUN: 36 mg/dL — ABNORMAL HIGH (ref 6–20)
CO2: 28 mmol/L (ref 22–32)
Calcium: 10.1 mg/dL (ref 8.9–10.3)
Chloride: 98 mmol/L — ABNORMAL LOW (ref 101–111)
Creatinine, Ser: 1.22 mg/dL (ref 0.61–1.24)
GFR calc Af Amer: >60 mL/min (ref >60)
GFR calc non Af Amer: 60 mL/min — ABNORMAL LOW (ref >60)
Glucose, Bld: 76 mg/dL (ref 65–99)
Potassium: 5.6 mmol/L — ABNORMAL HIGH (ref 3.5–5.1)
Sodium: 137 mmol/L (ref 135–145)
Total Bilirubin: 1.3 mg/dL — ABNORMAL HIGH (ref 0.3–1.2)
Total Protein: 7.8 g/dL (ref 6.5–8.1)

## 2017-06-26 LAB — URINALYSIS, ROUTINE W REFLEX MICROSCOPIC
Bilirubin Urine: NEGATIVE
Glucose, UA: NEGATIVE mg/dL
Ketones, ur: NEGATIVE mg/dL
Nitrite: NEGATIVE
Protein, ur: 30 mg/dL — AB
Specific Gravity, Urine: 1.014 (ref 1.005–1.030)
Squamous Epithelial / LPF: NONE SEEN
pH: 6 (ref 5.0–8.0)

## 2017-06-26 LAB — CBC WITH DIFFERENTIAL/PLATELET
Basophils Absolute: 0 10*3/uL (ref 0.0–0.1)
Basophils Relative: 0 %
Eosinophils Absolute: 0.8 10*3/uL — ABNORMAL HIGH (ref 0.0–0.7)
Eosinophils Relative: 5 %
HCT: 36.6 % — ABNORMAL LOW (ref 39.0–52.0)
Hemoglobin: 11.5 g/dL — ABNORMAL LOW (ref 13.0–17.0)
Lymphocytes Relative: 10 %
Lymphs Abs: 1.7 10*3/uL (ref 0.7–4.0)
MCH: 28.3 pg (ref 26.0–34.0)
MCHC: 31.4 g/dL (ref 30.0–36.0)
MCV: 89.9 fL (ref 78.0–100.0)
Monocytes Absolute: 2.2 10*3/uL — ABNORMAL HIGH (ref 0.1–1.0)
Monocytes Relative: 13 %
Neutro Abs: 12.3 10*3/uL — ABNORMAL HIGH (ref 1.7–7.7)
Neutrophils Relative %: 72 %
Platelets: 541 10*3/uL — ABNORMAL HIGH (ref 150–400)
RBC: 4.07 MIL/uL — ABNORMAL LOW (ref 4.22–5.81)
RDW: 17.1 % — ABNORMAL HIGH (ref 11.5–15.5)
WBC: 17.2 10*3/uL — ABNORMAL HIGH (ref 4.0–10.5)

## 2017-06-26 LAB — I-STAT CG4 LACTIC ACID, ED: Lactic Acid, Venous: 1.06 mmol/L (ref 0.5–1.9)

## 2017-06-26 LAB — LIPASE, BLOOD: Lipase: 21 U/L (ref 11–51)

## 2017-06-26 MED ORDER — MEROPENEM 1 G IV SOLR
1.0000 g | Freq: Once | INTRAVENOUS | Status: AC
  Administered 2017-06-27: 1 g via INTRAVENOUS
  Filled 2017-06-26: qty 1

## 2017-06-26 MED ORDER — SODIUM CHLORIDE 0.9 % IV SOLN
1.0000 g | Freq: Once | INTRAVENOUS | Status: DC
  Filled 2017-06-26: qty 1

## 2017-06-26 MED ORDER — VANCOMYCIN HCL IN DEXTROSE 1-5 GM/200ML-% IV SOLN
1000.0000 mg | Freq: Once | INTRAVENOUS | Status: AC
  Administered 2017-06-27: 1000 mg via INTRAVENOUS
  Filled 2017-06-26: qty 200

## 2017-06-26 NOTE — ED Notes (Signed)
Pt deep suctioned. Moderate amount, yellowish color.

## 2017-06-26 NOTE — ED Triage Notes (Signed)
Complaint of blood pus and swollen area around gtube. Also has blood tinged urine and increase secretions on trach. Pt afebrile per EMS. Nonverbal. Was seen at med center two days ago.

## 2017-06-26 NOTE — ED Notes (Signed)
Pt has hx of cva. Pt has trach, pt nonverbal. Pt able to nod head yes or no when asked questions. Pt has foley catheter in place prior to arrival , pt also has has gtube in lower left abdominal prior to arrival. No swelling noted around gtube or drainage. Lurline Idol has frequent secretions requiring frequent suction.

## 2017-06-26 NOTE — ED Provider Notes (Signed)
[place] EMERGENCY DEPARTMENT Provider Note   CSN: [id number] Arrival date & time: 06/26/17  1931     History   Chief Complaint Chief Complaint  Patient presents with  . Gtube leakage    HPI KALAI BACA is a 67 y.o. male.  The history is provided by the EMS personnel and a relative. The history is limited by the condition of the patient.  URI   This is a recurrent problem. The current episode started more than 2 days ago. The problem has been gradually worsening. There has been no fever. Associated symptoms include congestion, rhinorrhea and cough. Pertinent negatives include no chest pain, no abdominal pain, no diarrhea, no nausea, no vomiting, no dysuria, no headaches, no sneezing, no rash and no wheezing. He has tried nothing for the symptoms. The treatment provided no relief.    Past Medical History:  Diagnosis Date  . Anemia due to chronic kidney disease   . Aspiration pneumonia ([place])   . Chronic kidney disease, stage 3 (moderate) (HCC)   . Hypertension   . Stroke [facility])     Patient Active Problem List   Diagnosis Date Noted  . Bronchitis/??? Pneumonia 03/30/2017  . S/P percutaneous endoscopic gastrostomy (PEG) tube placement ([place]) 03/30/2017  . Chronic anemia 03/30/2017  . Foley catheter in place on admission 03/30/2017  . UTI (urinary tract infection) 03/30/2017  . MDR Acinetobacter baumannii infection   . Gastrostomy tube obstruction ([place])   . PEG tube malfunction ([place])   . Acute respiratory failure with hypoxia ([place]) 03/02/2017  . Loculated pleural effusion   . Acute respiratory distress   . Fever   . Follow up   . HCAP (healthcare-associated pneumonia)   . Stage III pressure ulcer of sacral region ([place]) 12/17/2016  . Hypoxemia   . H/O tracheostomy   . Pneumothorax   . Pneumothorax, closed, traumatic, initial encounter 12/10/2016  . Sepsis ([place]) 12/09/2016  . Hypotension 12/09/2016  . Acute-on-chronic kidney injury ([place])  12/09/2016  . Elevated troponin 12/09/2016  . Anemia due to chronic kidney disease 12/09/2016  . Compensated metabolic alkalosis 65/06/5463  . Aspiration pneumonia ([place])   . Goals of care, counseling/discussion   . Palliative care by specialist   . Respiratory failure ([place])   . Aspiration into airway   . Septic shock ([place])   . Dysphagia, post-stroke   . Benign essential HTN   . Tobacco abuse   . Tachypnea   . Hyperglycemia   . Hypernatremia   . Leukocytosis (leucocytosis)   . Acute blood loss anemia   . Chronic kidney disease   . Pressure injury of skin 10/20/2016  . Cerebellar infarct ([place]) 10/19/2016  . ARF (acute renal failure) ([place]) 10/19/2016  . Essential hypertension 10/19/2016  . Rhabdomyolysis 10/19/2016  . Acute ischemic stroke ([place]) 10/19/2016  . Bilateral chronic knee pain 01/07/2016  . History of adenomatous polyp of colon 07/28/2015  . Hyperlipidemia 07/28/2013  . Helicobacter pylori infection 09/21/2012    Past Surgical History:  Procedure Laterality Date  . IR GASTROSTOMY TUBE MOD SED  10/27/2016  . IR PATIENT EVAL TECH 0-60 MINS  01/05/2017  . IR PATIENT EVAL TECH 0-60 MINS  01/09/2017  . IR PATIENT EVAL TECH 0-60 MINS  03/03/2017  . IR PATIENT EVAL TECH 0-60 MINS  04/03/2017  . KNEE SURGERY    . TRACHEOSTOMY TUBE PLACEMENT         Home Medications    Prior to Admission medications  Medication Sig Start Date End Date Taking? Authorizing Provider  acetaminophen (TYLENOL) 500 MG tablet Place 1,000 mg into feeding tube as needed for moderate pain.     [provider]  amLODipine (NORVASC) 10 MG tablet Place 1 tablet (10 mg total) into feeding tube daily. 01/11/17   Cherene Altes, MD  aspirin 81 MG chewable tablet Place 1 tablet (81 mg total) into feeding tube daily. 04/05/17   Nita Sells, MD  cloNIDine (CATAPRES - DOSED IN MG/24 HR) 0.1 mg/24hr patch Place 1 patch (0.1 mg total) onto the skin once a week. 04/10/17   Nita Sells, MD  clopidogrel (PLAVIX) 75 MG tablet Place 1 tablet (75 mg total) into feeding tube daily. 01/11/17   Cherene Altes, MD  famotidine (PEPCID) 40 MG tablet Place 40 mg daily into feeding tube.    [provider]  gabapentin (NEURONTIN) 100 MG capsule Take 100 mg See admin instructions by mouth. Open 1 capsule (100 mg) and administer contents via feeding tube three times daily    [provider]  ipratropium-albuterol (DUONEB) 0.5-2.5 (3) MG/3ML SOLN Take 3 mLs by nebulization every 4 (four) hours as needed. Patient taking differently: Take 3 mLs by nebulization every 4 (four) hours as needed (shortness of breath/wheezing).  01/10/17   Cherene Altes, MD  levofloxacin (LEVAQUIN) 500 MG tablet Take 1 tablet (500 mg total) by mouth daily. 04/05/17   Nita Sells, MD  levofloxacin (LEVAQUIN) 750 MG tablet Take 1 tablet (750 mg total) by mouth daily for 7 days. 06/20/17 06/27/17  Glyn Ade, PA-C  metoprolol tartrate (LOPRESSOR) 25 MG tablet Place 25 mg 2 (two) times daily into feeding tube.     [provider]  Nutritional Supplements (FEEDING SUPPLEMENT, JEVITY 1.2 CAL,) LIQD Place 1,000 mLs into feeding tube continuous. Patient taking differently: Place 1,000 mLs continuous into feeding tube. 60 ml/hr 01/10/17   Cherene Altes, MD  polyethylene glycol [facility]) packet Take 17 g by mouth daily. 06/20/17   Glyn Ade, PA-C  pravastatin (PRAVACHOL) 20 MG tablet Place 1 tablet (20 mg total) into feeding tube daily at 6 PM. Patient taking differently: Place 20 mg daily into feeding tube.  01/10/17   Cherene Altes, MD  thiamine 100 MG tablet Place 1 tablet (100 mg total) into feeding tube daily. 01/11/17   Cherene Altes, MD  Water For Irrigation, Sterile (FREE WATER) SOLN Place 100 mLs into feeding tube every 4 (four) hours. 04/04/17   Nita Sells, MD    Family History Family History  Problem Relation Age of Onset  .  Diabetes Mellitus II Brother   . CAD Neg Hx   . Stroke Neg Hx     Social History Social History   Tobacco Use  . Smoking status: Former Research scientist (life sciences)  . Smokeless tobacco: Never Used  . Tobacco comment: Smoked heavily until the day of his stroke  Substance Use Topics  . Alcohol use: Yes    Comment: Drank heavily prior to stroke per sister  . Drug use: Not on file     Allergies   Patient has no known allergies.   Review of Systems Review of Systems  Constitutional: Negative for appetite change and fever.  HENT: Positive for congestion and rhinorrhea. Negative for sneezing.   Respiratory: Positive for cough and shortness of breath. Negative for chest tightness and wheezing.   Cardiovascular: Negative for chest pain.  Gastrointestinal: Negative for abdominal pain, constipation, diarrhea, nausea and vomiting.  Genitourinary: Positive for hematuria. Negative for dysuria and flank pain.  Musculoskeletal: Negative for back pain.  Skin: Positive for wound. Negative for color change and rash.  Neurological: Negative for light-headedness and headaches.  All other systems reviewed and are negative.    Physical Exam Updated Vital Signs BP 127/63   Pulse 84 Comment: Simultaneous filing. User may not have seen previous data.  Temp 97.8 F (36.6 C) (Oral)   Resp 18 Comment: Simultaneous filing. User may not have seen previous data.  SpO2 (!) 89% Comment: Simultaneous filing. User may not have seen previous data.  Physical Exam  Constitutional: He appears well-nourished. No distress.  HENT:  Head: Normocephalic.  Mouth/Throat: Oropharynx is clear and moist. No oropharyngeal exudate.  Secretions from trach   Eyes: Pupils are equal, round, and reactive to light.  Neck: Normal range of motion.  Cardiovascular: Normal rate and intact distal pulses.  No murmur heard. Pulmonary/Chest: Tachypnea noted. No respiratory distress. He has decreased breath sounds. He has rhonchi. He has no  rales. He exhibits no tenderness.  Abdominal: Soft. Bowel sounds are normal. He exhibits no distension. There is no tenderness. There is no rebound.  Musculoskeletal: He exhibits no edema or tenderness.  Neurological: He is alert. No sensory deficit. He exhibits abnormal muscle tone.  Skin: Capillary refill takes less than 2 seconds. He is not diaphoretic. No erythema. No pallor.  Psychiatric: He has a normal mood and affect.  Nursing note and vitals reviewed.    ED Treatments / Results  Labs (all labs ordered are listed, but only abnormal results are displayed) Labs Reviewed  CBC WITH DIFFERENTIAL/PLATELET - Abnormal; Notable for the following components:      Result Value   WBC 17.2 (*)    RBC 4.07 (*)    Hemoglobin 11.5 (*)    HCT 36.6 (*)    RDW 17.1 (*)    Platelets 541 (*)    Neutro Abs 12.3 (*)    Monocytes Absolute 2.2 (*)    Eosinophils Absolute 0.8 (*)    All other components within normal limits  COMPREHENSIVE METABOLIC PANEL - Abnormal; Notable for the following components:   Potassium 5.6 (*)    Chloride 98 (*)    BUN 36 (*)    Albumin 2.8 (*)    AST 43 (*)    Alkaline Phosphatase 132 (*)    Total Bilirubin 1.3 (*)    GFR calc non Af Amer 60 (*)    All other components within normal limits  URINALYSIS, ROUTINE W REFLEX MICROSCOPIC - Abnormal; Notable for the following components:   APPearance HAZY (*)    Hgb urine dipstick LARGE (*)    Protein, ur 30 (*)    Leukocytes, UA LARGE (*)    Bacteria, UA FEW (*)    All other components within normal limits  CULTURE, RESPIRATORY (NON-EXPECTORATED)  URINE CULTURE  CULTURE, BLOOD (ROUTINE X 2)  CULTURE, BLOOD (ROUTINE X 2)  CULTURE, EXPECTORATED SPUTUM-ASSESSMENT  GRAM STAIN  LIPASE, BLOOD  POTASSIUM  POTASSIUM  POTASSIUM  POTASSIUM  POTASSIUM  STREP PNEUMONIAE URINARY ANTIGEN  COMPREHENSIVE METABOLIC PANEL  CBC WITH DIFFERENTIAL/PLATELET  LEGIONELLA PNEUMOPHILA SEROGP 1 UR AG  PROCALCITONIN    PROCALCITONIN  I-STAT CG4 LACTIC ACID, ED  I-STAT CG4 LACTIC ACID, ED    EKG  EKG Interpretation  Date/Time:  Tuesday June 26 2017 22:49:41 EDT Ventricular Rate:  91 PR Interval:    QRS Duration: 90 QT Interval:  342 QTC Calculation: 421  R Axis:   69 Text Interpretation:  Sinus rhythm Probable left atrial enlargement RSR' in V1 or V2, right VCD or RVH ST elevation suggests acute pericarditis When compared to prior, no significant changes seen.  No STEMI Confirmed by Antony Blackbird [phone]) on 06/26/2017 10:56:27 PM       Radiology Dg Chest Portable 1 View  Result Date: 06/26/2017 CLINICAL DATA:  Blood, pus and swelling about the patient's gastric tube. Blood tinged urine and increased secretions in tracheostomy. History of aspiration pneumonia, hypertension and infarct. Patient is afebrile. EXAM: PORTABLE CHEST 1 VIEW COMPARISON:  06/20/2017 FINDINGS: Stable cardiomegaly with aortic atherosclerosis. Patchy airspace opacities persist at the lung bases, right greater than left, slightly improved at the left lung base and unchanged at the right lung base. No overt pulmonary edema. No effusion. Tracheostomy tube tip is centered over the upper third of the trachea in satisfactory position. Surgical clips project over the left lung apex and left lung base. 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 Corticoclavicular ligament ossification on the right. IMPRESSION: 1. Cardiomegaly with bibasilar airspace opacities, slightly improved at the left lung base and persistent and unchanged in appearance at the right lung base. Findings suggest partial clearing of left basilar atelectasis and/or pneumonia with unchanged right basilar atelectasis and pneumonia. 2. No overt pulmonary edema. 3. Aortic atherosclerosis. 4. 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 Electronically Signed   By: Ashley Royalty M.D.   On: 06/26/2017 21:52    Procedures Procedures  (including critical care time)  CRITICAL CARE Performed by: Gwenyth Allegra Rhealyn Cullen Total critical care time: 35 minutes Critical care time was exclusive of separately billable procedures and treating other patients. Recurrent PNA requiring multiple Abx and admission.  Critical care was necessary to treat or prevent imminent or life-threatening deterioration. Critical care was time spent personally by me on the following activities: development of treatment plan with patient and/or surrogate as well as nursing, discussions with consultants, evaluation of patient's response to treatment, examination of patient, obtaining history from patient or surrogate, ordering and performing treatments and interventions, ordering and review of laboratory studies, ordering and review of radiographic studies, pulse oximetry and re-evaluation of patient's condition.   Medications Ordered in ED Medications  vancomycin (VANCOCIN) IVPB 1000 mg/200 mL premix (1,000 mg Intravenous New Bag/Given 06/27/17 0139)  aspirin chewable tablet 81 mg (not administered)  cloNIDine (CATAPRES - Dosed in mg/24 hr) patch 0.1 mg (not administered)  clopidogrel (PLAVIX) tablet 75 mg (not administered)  escitalopram (LEXAPRO) tablet 10 mg (not administered)  famotidine (PEPCID) tablet 40 mg (not administered)  gabapentin (NEURONTIN) capsule 100 mg (not administered)  ipratropium-albuterol (DUONEB) 0.5-2.5 (3) MG/3ML nebulizer solution 3 mL (not administered)  metoprolol tartrate (LOPRESSOR) tablet 25 mg (not administered)  feeding supplement (JEVITY 1.2 CAL) liquid 1,000 mL (not administered)  pravastatin (PRAVACHOL) tablet 20 mg (not administered)  thiamine (VITAMIN B-1) tablet 100 mg (not administered)  free water 100 mL (not administered)  enoxaparin (LOVENOX) injection 40 mg (not administered)  sodium chloride flush (NS) 0.9 % injection 3 mL (not administered)  0.9 %  sodium chloride infusion (not administered)  acetaminophen  (TYLENOL) tablet 650 mg (not administered)    Or  acetaminophen (TYLENOL) suppository 650 mg (not administered)  HYDROcodone-acetaminophen (NORCO/VICODIN) 5-325 MG per tablet 1-2 tablet (not administered)  ondansetron (ZOFRAN) tablet 4 mg (not administered)    Or  ondansetron (ZOFRAN) injection 4 mg (not administered)  sodium chloride 0.9 % bolus 500  mL (not administered)  vancomycin (VANCOCIN) 500 mg in sodium chloride 0.9 % 100 mL IVPB (not administered)  meropenem (MERREM) 1 g in sodium chloride 0.9 % 100 mL IVPB (not administered)  meropenem (MERREM) 1 g in sodium chloride 0.9 % 100 mL IVPB (0 g Intravenous Stopped 06/27/17 0121)     Initial Impression / Assessment and Plan / ED Course  I have reviewed the triage vital signs and the nursing notes.  Pertinent labs & imaging results that were available during my care of the patient were reviewed by me and considered in my medical decision making (see chart for details).      MAJID MCCRAVY is a 67 y.o. male with a past medical history significant for hypertension, stroke with subsequent trach dependence, G-tube dependence, and permanent Foley catheter as well as recent diagnosis of pneumonia on antibiotics who presents with worsened breathing, cough, tracheal secretions, hematuria, and some drainage around G-tube site.  Patient is brought in by family who report that he is finishing his antibiotics tomorrow but is continued to have worsened respiratory symptoms.  Patient was found to have hypoxia at home and does not take oxygen normally.  Patient placed on blow-by oxygen on arrival for saturations in the 80s.  Patient family reports he has had increased secretions from his trach.  Patient is also had a deep cough.  They are concerned about some leakage around his G-tube site however patient does not have abdominal tenderness by report.  No nausea or vomiting.  They saw some hematuria and would like his urine checked.  On my exam, patient  is an ill-appearing man with thick secretions from his trach.  Deep suctioning was performed by respiratory therapy and sputum was produced.  Patient's breath sounds were very coarse bilaterally.  Abdominal exam showed G-tube in place with no significant tenderness or erythema.  There was some mild drainage around the site however review of chart shows CT last week showed no abscess.  Diagnostic workup shows worsening leukocytosis and similar pneumonia.  Given the patient's hypoxia requiring oxygen and clinical findings with coarse breath sounds, I am concerned he has continued and worsening pneumonia.  Patient given broad-spectrum antibiotics and will be admitted for further management.         Final Clinical Impressions(s) / ED Diagnoses   Final diagnoses:  Recurrent pneumonia  Hypoxia    ED Discharge Orders    None      Clinical Impression: 1. Recurrent pneumonia   2. Hypoxia     Disposition: Admit  This note was prepared with assistance of Dragon voice recognition software. Occasional wrong-word or sound-a-like substitutions may have occurred due to the inherent limitations of voice recognition software.     Janaria Mccammon, Gwenyth Allegra, MD 06/27/17 [phone]

## 2017-06-27 ENCOUNTER — Encounter (HOSPITAL_COMMUNITY): Payer: Self-pay

## 2017-06-27 ENCOUNTER — Other Ambulatory Visit: Payer: Self-pay

## 2017-06-27 DIAGNOSIS — Z8673 Personal history of transient ischemic attack (TIA), and cerebral infarction without residual deficits: Secondary | ICD-10-CM | POA: Diagnosis not present

## 2017-06-27 DIAGNOSIS — Z930 Tracheostomy status: Secondary | ICD-10-CM | POA: Diagnosis not present

## 2017-06-27 DIAGNOSIS — N183 Chronic kidney disease, stage 3 (moderate): Secondary | ICD-10-CM | POA: Diagnosis present

## 2017-06-27 DIAGNOSIS — J181 Lobar pneumonia, unspecified organism: Secondary | ICD-10-CM | POA: Diagnosis not present

## 2017-06-27 DIAGNOSIS — J189 Pneumonia, unspecified organism: Principal | ICD-10-CM

## 2017-06-27 DIAGNOSIS — D631 Anemia in chronic kidney disease: Secondary | ICD-10-CM | POA: Diagnosis present

## 2017-06-27 DIAGNOSIS — Z8614 Personal history of Methicillin resistant Staphylococcus aureus infection: Secondary | ICD-10-CM | POA: Diagnosis not present

## 2017-06-27 DIAGNOSIS — R319 Hematuria, unspecified: Secondary | ICD-10-CM

## 2017-06-27 DIAGNOSIS — I10 Essential (primary) hypertension: Secondary | ICD-10-CM | POA: Diagnosis not present

## 2017-06-27 DIAGNOSIS — Z87891 Personal history of nicotine dependence: Secondary | ICD-10-CM | POA: Diagnosis not present

## 2017-06-27 DIAGNOSIS — Z79899 Other long term (current) drug therapy: Secondary | ICD-10-CM | POA: Diagnosis not present

## 2017-06-27 DIAGNOSIS — E875 Hyperkalemia: Secondary | ICD-10-CM | POA: Diagnosis present

## 2017-06-27 DIAGNOSIS — J9601 Acute respiratory failure with hypoxia: Secondary | ICD-10-CM | POA: Diagnosis present

## 2017-06-27 DIAGNOSIS — I129 Hypertensive chronic kidney disease with stage 1 through stage 4 chronic kidney disease, or unspecified chronic kidney disease: Secondary | ICD-10-CM | POA: Diagnosis present

## 2017-06-27 DIAGNOSIS — R310 Gross hematuria: Secondary | ICD-10-CM | POA: Diagnosis present

## 2017-06-27 DIAGNOSIS — Z931 Gastrostomy status: Secondary | ICD-10-CM | POA: Diagnosis not present

## 2017-06-27 DIAGNOSIS — R0902 Hypoxemia: Secondary | ICD-10-CM | POA: Diagnosis present

## 2017-06-27 DIAGNOSIS — Y95 Nosocomial condition: Secondary | ICD-10-CM | POA: Diagnosis present

## 2017-06-27 DIAGNOSIS — I69320 Aphasia following cerebral infarction: Secondary | ICD-10-CM | POA: Diagnosis not present

## 2017-06-27 DIAGNOSIS — Z7982 Long term (current) use of aspirin: Secondary | ICD-10-CM | POA: Diagnosis not present

## 2017-06-27 LAB — COMPREHENSIVE METABOLIC PANEL
ALT: 38 U/L (ref 17–63)
AST: 27 U/L (ref 15–41)
Albumin: 2.8 g/dL — ABNORMAL LOW (ref 3.5–5.0)
Alkaline Phosphatase: 119 U/L (ref 38–126)
Anion gap: 14 (ref 5–15)
BUN: 32 mg/dL — ABNORMAL HIGH (ref 6–20)
CO2: 25 mmol/L (ref 22–32)
Calcium: 10.6 mg/dL — ABNORMAL HIGH (ref 8.9–10.3)
Chloride: 101 mmol/L (ref 101–111)
Creatinine, Ser: 1.15 mg/dL (ref 0.61–1.24)
GFR calc Af Amer: >60 mL/min (ref >60)
GFR calc non Af Amer: >60 mL/min (ref >60)
Glucose, Bld: 72 mg/dL (ref 65–99)
Potassium: 4.6 mmol/L (ref 3.5–5.1)
Sodium: 140 mmol/L (ref 135–145)
Total Bilirubin: 0.7 mg/dL (ref 0.3–1.2)
Total Protein: 8.1 g/dL (ref 6.5–8.1)

## 2017-06-27 LAB — CBC WITH DIFFERENTIAL/PLATELET
Basophils Absolute: 0 10*3/uL (ref 0.0–0.1)
Basophils Relative: 0 %
Eosinophils Absolute: 0.7 10*3/uL (ref 0.0–0.7)
Eosinophils Relative: 4 %
HCT: 36.2 % — ABNORMAL LOW (ref 39.0–52.0)
Hemoglobin: 11.7 g/dL — ABNORMAL LOW (ref 13.0–17.0)
Lymphocytes Relative: 16 %
Lymphs Abs: 2.7 10*3/uL (ref 0.7–4.0)
MCH: 29 pg (ref 26.0–34.0)
MCHC: 32.3 g/dL (ref 30.0–36.0)
MCV: 89.8 fL (ref 78.0–100.0)
Monocytes Absolute: 1.8 10*3/uL — ABNORMAL HIGH (ref 0.1–1.0)
Monocytes Relative: 11 %
Neutro Abs: 11.5 10*3/uL — ABNORMAL HIGH (ref 1.7–7.7)
Neutrophils Relative %: 69 %
Platelets: 551 10*3/uL — ABNORMAL HIGH (ref 150–400)
RBC: 4.03 MIL/uL — ABNORMAL LOW (ref 4.22–5.81)
RDW: 17.5 % — ABNORMAL HIGH (ref 11.5–15.5)
WBC: 16.8 10*3/uL — ABNORMAL HIGH (ref 4.0–10.5)

## 2017-06-27 LAB — PROCALCITONIN
Procalcitonin: 0.2 ng/mL
Procalcitonin: 0.18 ng/mL

## 2017-06-27 LAB — CBG MONITORING, ED
Glucose-Capillary: 72 mg/dL (ref 65–99)
Glucose-Capillary: 106 mg/dL — ABNORMAL HIGH (ref 65–99)

## 2017-06-27 LAB — MRSA PCR SCREENING: MRSA by PCR: NEGATIVE

## 2017-06-27 LAB — POTASSIUM: Potassium: 4.3 mmol/L (ref 3.5–5.1)

## 2017-06-27 LAB — STREP PNEUMONIAE URINARY ANTIGEN: Strep Pneumo Urinary Antigen: NEGATIVE

## 2017-06-27 MED ORDER — GABAPENTIN 250 MG/5ML PO SOLN
100.0000 mg | Freq: Three times a day (TID) | ORAL | Status: DC
  Administered 2017-06-27 – 2017-07-03 (×18): 100 mg
  Filled 2017-06-27 (×22): qty 2

## 2017-06-27 MED ORDER — SODIUM CHLORIDE 0.9 % IV SOLN
1.0000 g | Freq: Three times a day (TID) | INTRAVENOUS | Status: DC
  Administered 2017-06-27 – 2017-06-29 (×8): 1 g via INTRAVENOUS
  Filled 2017-06-27 (×10): qty 1

## 2017-06-27 MED ORDER — ENOXAPARIN SODIUM 40 MG/0.4ML ~~LOC~~ SOLN
40.0000 mg | SUBCUTANEOUS | Status: DC
  Administered 2017-06-27 – 2017-07-03 (×7): 40 mg via SUBCUTANEOUS
  Filled 2017-06-27 (×8): qty 0.4

## 2017-06-27 MED ORDER — ONDANSETRON HCL 4 MG/2ML IJ SOLN: 4.0000 mg | Freq: Four times a day (QID) | INTRAMUSCULAR | Status: DC | PRN

## 2017-06-27 MED ORDER — SODIUM CHLORIDE 0.9 % IV BOLUS (SEPSIS)
500.0000 mL | Freq: Once | INTRAVENOUS | Status: AC
  Administered 2017-06-27: 500 mL via INTRAVENOUS

## 2017-06-27 MED ORDER — SODIUM CHLORIDE 0.9 % IV SOLN
500.0000 mg | Freq: Two times a day (BID) | INTRAVENOUS | Status: DC
Start: 2017-06-27 — End: 2017-06-28
  Administered 2017-06-27 – 2017-06-28 (×2): 500 mg via INTRAVENOUS
  Filled 2017-06-27 (×4): qty 500

## 2017-06-27 MED ORDER — ESCITALOPRAM OXALATE 10 MG PO TABS
10.0000 mg | ORAL_TABLET | Freq: Every day | ORAL | Status: DC
  Administered 2017-06-27 – 2017-07-03 (×7): 10 mg
  Filled 2017-06-27 (×7): qty 1

## 2017-06-27 MED ORDER — ACETAMINOPHEN 650 MG RE SUPP: 650.0000 mg | Freq: Four times a day (QID) | RECTAL | Status: DC | PRN

## 2017-06-27 MED ORDER — SODIUM CHLORIDE 0.9% FLUSH
3.0000 mL | Freq: Two times a day (BID) | INTRAVENOUS | Status: DC
  Administered 2017-06-28 – 2017-07-03 (×6): 3 mL via INTRAVENOUS

## 2017-06-27 MED ORDER — ONDANSETRON HCL 4 MG PO TABS
4.0000 mg | ORAL_TABLET | Freq: Four times a day (QID) | ORAL | Status: DC | PRN
Start: 2017-06-27 — End: 2017-07-03

## 2017-06-27 MED ORDER — FAMOTIDINE 20 MG PO TABS
40.0000 mg | ORAL_TABLET | Freq: Every day | ORAL | Status: DC
  Administered 2017-06-27 – 2017-07-02 (×6): 40 mg
  Filled 2017-06-27 (×6): qty 2

## 2017-06-27 MED ORDER — VITAMIN B-1 100 MG PO TABS
100.0000 mg | ORAL_TABLET | Freq: Every day | ORAL | Status: DC
  Administered 2017-06-27 – 2017-07-02 (×6): 100 mg
  Filled 2017-06-27 (×6): qty 1

## 2017-06-27 MED ORDER — METOPROLOL TARTRATE 25 MG PO TABS
25.0000 mg | ORAL_TABLET | Freq: Two times a day (BID) | ORAL | Status: DC
  Administered 2017-06-27 (×2): 25 mg
  Filled 2017-06-27 (×2): qty 1

## 2017-06-27 MED ORDER — JEVITY 1.2 CAL PO LIQD
1000.0000 mL | ORAL | Status: DC
  Administered 2017-06-27 – 2017-07-03 (×9): 1000 mL
  Filled 2017-06-27 (×12): qty 1000

## 2017-06-27 MED ORDER — ASPIRIN 81 MG PO CHEW
81.0000 mg | CHEWABLE_TABLET | Freq: Every day | ORAL | Status: DC
  Administered 2017-06-28 – 2017-07-03 (×6): 81 mg
  Filled 2017-06-27 (×7): qty 1

## 2017-06-27 MED ORDER — ACETAMINOPHEN 325 MG PO TABS
650.0000 mg | ORAL_TABLET | Freq: Four times a day (QID) | ORAL | Status: DC | PRN
  Administered 2017-06-29 – 2017-07-02 (×2): 650 mg via ORAL
  Filled 2017-06-27 (×2): qty 2

## 2017-06-27 MED ORDER — CLOPIDOGREL BISULFATE 75 MG PO TABS
75.0000 mg | ORAL_TABLET | Freq: Every day | ORAL | Status: DC
  Administered 2017-06-27 – 2017-07-03 (×7): 75 mg
  Filled 2017-06-27 (×7): qty 1

## 2017-06-27 MED ORDER — SODIUM CHLORIDE 0.9 % IV SOLN
INTRAVENOUS | Status: AC
  Administered 2017-06-27: 04:00:00 via INTRAVENOUS

## 2017-06-27 MED ORDER — CLONIDINE HCL 0.1 MG/24HR TD PTWK
0.1000 mg | MEDICATED_PATCH | TRANSDERMAL | Status: DC
  Administered 2017-07-03: 0.1 mg via TRANSDERMAL
  Filled 2017-06-27 (×2): qty 1

## 2017-06-27 MED ORDER — IPRATROPIUM-ALBUTEROL 0.5-2.5 (3) MG/3ML IN SOLN: 3.0000 mL | RESPIRATORY_TRACT | Status: DC | PRN

## 2017-06-27 MED ORDER — FREE WATER
100.0000 mL | Status: DC
Start: 2017-06-27 — End: 2017-07-02
  Administered 2017-06-27 – 2017-07-02 (×31): 100 mL

## 2017-06-27 MED ORDER — HYDROCODONE-ACETAMINOPHEN 5-325 MG PO TABS
1.0000 | ORAL_TABLET | ORAL | Status: DC | PRN
  Administered 2017-06-27: 2
  Filled 2017-06-27: qty 1
  Filled 2017-06-27: qty 2

## 2017-06-27 MED ORDER — PRAVASTATIN SODIUM 20 MG PO TABS
20.0000 mg | ORAL_TABLET | Freq: Every day | ORAL | Status: DC
  Administered 2017-06-27 – 2017-07-02 (×6): 20 mg
  Filled 2017-06-27 (×6): qty 1

## 2017-06-27 NOTE — H&P (Signed)
History and Physical    SHRIHAN PUTT QIH:[id number] DOB: 1950/09/01 DOA: 06/26/2017  PCP: Patient, No Pcp Per   Chief Complaint: Increased trach secretions, hypoxia, hematuria, drainage and swelling around his PEG  HPI: YOSHITO GAZA is a 67 y.o. male with medical history significant for history of CVA with resulting aphasia, weakness, PEG dependence, and tracheostomy, now presenting to the emergency department for evaluation of increased secretions from his tracheostomy, hypoxia, gross hematuria, and drainage and swelling about his PEG site.  History is obtained from patient's family member and review of the EMR.  He has reportedly been experiencing gross hematuria for more than a week, as well as reported swelling and drainage from around his PEG tube.  He has also had increased secretions from his trach, described as thick and colored.  He was evaluated in the emergency department on 06/20/2017, PEG tube appeared normal and CT of the abdomen and pelvis did not reveal any surrounding fluid collection or inflammation.  He was diagnosed with pneumonia during that ED visit and started on Levaquin.  He is on his last day of Levaquin, but respiratory condition has not improved, and may have worsened.  EMS found the patient be saturating in the mid 80s on his usual room air.  He was hospitalized in December for pneumonia and sputum culture grew multidrug-resistant Pseudomonas.  ED Course: Upon arrival to the ED, patient is found to be afebrile, saturating 80s on room air, slightly tachypneic, and vitals otherwise normal.  EKG features a sinus rhythm with nonspecific ST abnormality.  Chest x-ray is notable for cardiomegaly and bibasilar airspace opacity, slightly improved on the left and unchanged on the right, concerning for persistent atelectasis versus pneumonia.  Chemistry panel reveals a potassium of 5.6 and elevated BUN and creatinine, now 36 and 1.22, respectively.  CBC is notable for an increased  leukocytosis to 17,200, stable normocytic anemia with hemoglobin of 11.5, and thrombocytosis with platelets 541,000.  Lactic acid is reassuringly normal.  Urinalysis suggests a possible infection, notable for hematuria, and sample was sent for culture.  Blood cultures were also collected and the patient was started on empiric vancomycin and cefepime.  He was started on supplemental oxygen.  Remains hemodynamically stable, is not in acute distress, and will be admitted to the telemetry unit for ongoing evaluation and management of increased exam purulent trach secretions and hypoxia suspected secondary to pneumonia.  Review of Systems:  Unable to complete ROS secondary to patient's clinical condition.  Past Medical History:  Diagnosis Date  . Anemia due to chronic kidney disease   . Aspiration pneumonia ([place])   . Chronic kidney disease, stage 3 (moderate) (HCC)   . Hypertension   . Stroke [facility])     Past Surgical History:  Procedure Laterality Date  . IR GASTROSTOMY TUBE MOD SED  10/27/2016  . IR PATIENT EVAL TECH 0-60 MINS  01/05/2017  . IR PATIENT EVAL TECH 0-60 MINS  01/09/2017  . IR PATIENT EVAL TECH 0-60 MINS  03/03/2017  . IR PATIENT EVAL TECH 0-60 MINS  04/03/2017  . KNEE SURGERY    . TRACHEOSTOMY TUBE PLACEMENT       reports that he has quit smoking. he has never used smokeless tobacco. He reports that he drinks alcohol. His drug history is not on file.  No Known Allergies  Family History  Problem Relation Age of Onset  . Diabetes Mellitus II Brother   . CAD Neg Hx   . Stroke Neg Hx  Prior to Admission medications   Medication Sig Start Date End Date Taking? Authorizing Provider  acetaminophen (TYLENOL) 500 MG tablet Place 1,000 mg into feeding tube as needed for moderate pain.    Yes [provider]  amLODipine (NORVASC) 10 MG tablet Place 1 tablet (10 mg total) into feeding tube daily. 01/11/17  Yes Cherene Altes, MD  aspirin 81 MG chewable tablet Place  1 tablet (81 mg total) into feeding tube daily. 04/05/17  Yes Nita Sells, MD  cloNIDine (CATAPRES - DOSED IN MG/24 HR) 0.1 mg/24hr patch Place 1 patch (0.1 mg total) onto the skin once a week. 04/10/17  Yes Nita Sells, MD  clopidogrel (PLAVIX) 75 MG tablet Place 1 tablet (75 mg total) into feeding tube daily. 01/11/17  Yes Cherene Altes, MD  escitalopram (LEXAPRO) 10 MG tablet Place 10 mg into feeding tube daily.  05/31/17  Yes [provider]  famotidine (PEPCID) 40 MG tablet Place 40 mg daily into feeding tube.   Yes [provider]  gabapentin (NEURONTIN) 100 MG capsule Take 100 mg See admin instructions by mouth. Open 1 capsule (100 mg) and administer contents via feeding tube three times daily   Yes [provider]  ipratropium-albuterol (DUONEB) 0.5-2.5 (3) MG/3ML SOLN Take 3 mLs by nebulization every 4 (four) hours as needed. Patient taking differently: Take 3 mLs by nebulization every 4 (four) hours as needed (shortness of breath/wheezing).  01/10/17  Yes Cherene Altes, MD  levofloxacin (LEVAQUIN) 750 MG tablet Take 1 tablet (750 mg total) by mouth daily for 7 days. Patient taking differently: Place 750 mg into feeding tube daily.  06/20/17 06/27/17 Yes Glyn Ade, PA-C  metoprolol tartrate (LOPRESSOR) 25 MG tablet Place 25 mg 2 (two) times daily into feeding tube.    Yes [provider]  Nutritional Supplements (FEEDING SUPPLEMENT, JEVITY 1.2 CAL,) LIQD Place 1,000 mLs into feeding tube continuous. Patient taking differently: Place 1,000 mLs continuous into feeding tube. 60 ml/hr 01/10/17  Yes Cherene Altes, MD  pravastatin (PRAVACHOL) 20 MG tablet Place 1 tablet (20 mg total) into feeding tube daily at 6 PM. Patient taking differently: Place 20 mg daily into feeding tube.  01/10/17  Yes Cherene Altes, MD  thiamine (VITAMIN B-1) 100 MG tablet Place 100 mg into feeding tube daily.    Yes [provider]    Water For Irrigation, Sterile (FREE WATER) SOLN Place 100 mLs into feeding tube every 4 (four) hours. 04/04/17  Yes Nita Sells, MD  levofloxacin (LEVAQUIN) 500 MG tablet Take 1 tablet (500 mg total) by mouth daily. Patient not taking: Reported on 06/26/2017 04/05/17   Nita Sells, MD  polyethylene glycol [facility]) packet Take 17 g by mouth daily. Patient not taking: Reported on 06/26/2017 06/20/17   Glyn Ade, PA-C  thiamine 100 MG tablet Place 1 tablet (100 mg total) into feeding tube daily. Patient not taking: Reported on 06/26/2017 01/11/17   Cherene Altes, MD    Physical Exam: Vitals:   06/26/17 2225 06/26/17 2226 06/26/17 2245 06/26/17 2300  BP:   131/67   Pulse: 90  95   Resp: 19  (!) 26   Temp:      TempSrc:      SpO2: 94% 95% 91%   Weight:    65 kg (143 lb 4.8 oz)  Height:    5\' 6"  (1.676 m)      Constitutional: NAD, calm Eyes: PERTLA, lids and conjunctivae normal ENMT: Mucous  membranes are moist. Posterior pharynx clear of any exudate or lesions.   Neck: supple, no masses, trach in place Respiratory: Rhonchi bilaterally. Mild tachypnea. No accessory muscle use.  Cardiovascular: S1 & S2 heard, regular rate and rhythm. No significant JVD. Abdomen: No distension, no tenderness, no masses palpated. Bowel sounds active.  Musculoskeletal: no clubbing / cyanosis. No joint deformity upper and lower extremities.   Skin: no significant rashes, lesions, ulcers. Warm, dry, well-perfused. Neurologic: Aphasia. No gross facial asymmetry. Moving all extremities.    Labs on Admission: I have personally reviewed following labs and imaging studies  CBC: Recent Labs  Lab 06/20/17 1645 06/26/17 2154  WBC 15.0* 17.2*  NEUTROABS 10.7* 12.3*  HGB 11.8* 11.5*  HCT 37.2* 36.6*  MCV 88.4 89.9  PLT 522* 629*   Basic Metabolic Panel: Recent Labs  Lab 06/20/17 1645 06/26/17 2154  NA 136 137  K 4.5 5.6*  CL 97* 98*  CO2 30 28  GLUCOSE 84 76  BUN 39*  36*  CREATININE 1.06 1.22  CALCIUM 10.3 10.1   GFR: Estimated Creatinine Clearance: 53 mL/min (by C-G formula based on SCr of 1.22 mg/dL). Liver Function Tests: Recent Labs  Lab 06/20/17 1645 06/26/17 2154  AST 49* 43*  ALT 65* 35  ALKPHOS 134* 132*  BILITOT 0.2* 1.3*  PROT 8.7* 7.8  ALBUMIN 3.1* 2.8*   Recent Labs  Lab 06/20/17 1645 06/26/17 2154  LIPASE 24 21   No results for input(s): AMMONIA in the last 168 hours. Coagulation Profile: No results for input(s): INR, PROTIME in the last 168 hours. Cardiac Enzymes: No results for input(s): CKTOTAL, CKMB, CKMBINDEX, TROPONINI in the last 168 hours. BNP (last 3 results) No results for input(s): PROBNP in the last 8760 hours. HbA1C: No results for input(s): HGBA1C in the last 72 hours. CBG: No results for input(s): GLUCAP in the last 168 hours. Lipid Profile: No results for input(s): CHOL, HDL, LDLCALC, TRIG, CHOLHDL, LDLDIRECT in the last 72 hours. Thyroid Function Tests: No results for input(s): TSH, T4TOTAL, FREET4, T3FREE, THYROIDAB in the last 72 hours. Anemia Panel: No results for input(s): VITAMINB12, FOLATE, FERRITIN, TIBC, IRON, RETICCTPCT in the last 72 hours. Urine analysis:    Component Value Date/Time   COLORURINE YELLOW 06/26/2017 2244   APPEARANCEUR HAZY (A) 06/26/2017 2244   LABSPEC 1.014 06/26/2017 2244   PHURINE 6.0 06/26/2017 2244   GLUCOSEU NEGATIVE 06/26/2017 2244   HGBUR LARGE (A) 06/26/2017 2244   BILIRUBINUR NEGATIVE 06/26/2017 [place] 06/26/2017 2244   PROTEINUR 30 (A) 06/26/2017 2244   NITRITE NEGATIVE 06/26/2017 2244   LEUKOCYTESUR LARGE (A) 06/26/2017 2244   Sepsis Labs: @LABRCNTIP (procalcitonin:4,lacticidven:4) ) Recent Results (from the past 240 hour(s))  Urine culture     Status: Abnormal   Collection Time: 06/20/17  4:25 PM  Result Value Ref Range Status   Specimen Description   Final    URINE, RANDOM Performed at [facility], [address] [place], [place] [address]    Special Requests   Final    NONE Performed at [facility], [address] [place], [place] [address]    Culture MULTIPLE SPECIES PRESENT, SUGGEST RECOLLECTION (A)  Final   Report Status 06/21/2017 FINAL  Final     Radiological Exams on Admission: Dg Chest Portable 1 View  Result Date: 06/26/2017 CLINICAL DATA:  Blood, pus and swelling about the patient's gastric tube. Blood tinged urine and increased secretions in tracheostomy. History of  aspiration pneumonia, hypertension and infarct. Patient is afebrile. EXAM: PORTABLE CHEST 1 VIEW COMPARISON:  06/20/2017 FINDINGS: Stable cardiomegaly with aortic atherosclerosis. Patchy airspace opacities persist at the lung bases, right greater than left, slightly improved at the left lung base and unchanged at the right lung base. No overt pulmonary edema. No effusion. Tracheostomy tube tip is centered over the upper third of the trachea in satisfactory position. Surgical clips project over the left lung apex and left lung base. 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 Corticoclavicular ligament ossification on the right. IMPRESSION: 1. Cardiomegaly with bibasilar airspace opacities, slightly improved at the left lung base and persistent and unchanged in appearance at the right lung base. Findings suggest partial clearing of left basilar atelectasis and/or pneumonia with unchanged right basilar atelectasis and pneumonia. 2. No overt pulmonary edema. 3. Aortic atherosclerosis. 4. 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 Electronically Signed   By: Ashley Royalty M.D.   On: 06/26/2017 21:52    EKG: Independently reviewed. Sinus rhythm, non-specific ST-abnormalities.   Assessment/Plan  1. HCAP; acute hypoxic respiratory failure   - Presents with multiple complaints, including increased trach secretions and hypoxia at home; he does not have baseline requirement for  supplemental O2, noted to have sat in mid-80% with EMS  - CXR findings suggest persistent PNA despite being on last day of Levaquin  - Thick purulent secretions suctioned from trach in ED  - He is afebrile on arrival; WBC has increased to 17,200  - Hospitalized with suspected pneumonia in December and sputum grew MDR pseudomonas; also has hx of MRSA carriage  - Blood cultures collected in ED and empiric vancomycin and cefepime started  - Pseudomonas from recent sputum culture had intermediate sensitivity to cefepime  - Continue empiric abx with vancomycin and meropenem; he is not septic, so do not feel dual antipseudomonal coverage needed for now  - Follow blood cultures, add sputum culture, follow clinical course and trend procalcitonin, continue prn supplemental O2  2. Hx of CVA  - Patient has hx of CVA with residual deficits including aphasia and weakness  - He is dependent on PEG and trach  - No acute deficits  - Continue ASA, Plavix, and statin    3. Hyperkalemia  - Serum potassium is 5.6 on admission  - Continue cardiac monitoring, hydrate with IVF, follow serial potassium levels   4. Hematuria  - Patient reports gross hematuria  - Most likely secondary to infection, sample sent for culture  - Started on antibiotics as above    5. Hypertension  - BP at goal  - Continue Lopressor     DVT prophylaxis: Lovenox Code Status: Full  Family Communication: Discussed with patient Consults called: None Admission status: Inpatient    Vianne Bulls, MD [facility] Pager [phone]  If 7PM-7AM, please contact night-coverage [email or web address] Password [facility]  06/27/2017, 12:21 AM

## 2017-06-27 NOTE — ED Notes (Signed)
Trach suctioned.  Pt continues to deny abdominal pain around G tube.

## 2017-06-27 NOTE — ED Notes (Signed)
CBG 72. Called pharmacy for feeding supplement to be sent.

## 2017-06-27 NOTE — ED Notes (Signed)
Pt refused to get his CBG checked. I informed pt that I was going to check his sugar. Pt shook his head no. I told pt I was not drawing any blood and that it was just going to be a finger prick. Pt then again shook his head no. When trying to get pt hand to show him where I was going he moved his hand from me and said no. I then said okay I will let the nurse know you refused to get your CBG checked.

## 2017-06-27 NOTE — Progress Notes (Signed)
Pt is alert and follow simple commands, with sister at bedside and 10hr a day nurse. 28% Fi02, Peg tube patent with, with chronic Foley indwelling, skin intact with scars, Trach care done. Plan to continue Iv antibiotic and Iv fluids.

## 2017-06-27 NOTE — ED Notes (Signed)
Trach suctioned  

## 2017-06-27 NOTE — ED Notes (Signed)
Called pharmacy because meropenem still has not arrived.

## 2017-06-27 NOTE — ED Notes (Signed)
Pt suctioned by respiratory.

## 2017-06-27 NOTE — ED Notes (Signed)
Pt suctioned, repositioned and switched to hospital bed. Pt cleaned

## 2017-06-27 NOTE — ED Notes (Signed)
Provider paged for request for fluids with dextrose.

## 2017-06-27 NOTE — ED Notes (Addendum)
Pt refusing blood draw,  notified admitting MD.

## 2017-06-27 NOTE — ED Notes (Signed)
Pt has patch on left upper arm. Entered with patch prior to arrival.

## 2017-06-27 NOTE — Progress Notes (Signed)
Pharmacy Antibiotic Note  Scott Rivera is a 67 y.o. male admitted on 06/26/2017 with pneumonia.  Pharmacy has been consulted for Vancocin and Merrem dosing.  Plan: Vancomycin 1000mg  x1 then 500mg  IV every 12 hours.  Goal trough 15-20 mcg/mL.  Merrem 1g IV every 8 hours.  Height: 5\' 6"  (167.6 cm) Weight: 143 lb 4.8 oz (65 kg) IBW/kg (Calculated) : 63.8  Temp (24hrs), Avg:97.8 F (36.6 C), Min:97.8 F (36.6 C), Max:97.8 F (36.6 C)  Recent Labs  Lab 06/20/17 1645 06/26/17 2154 06/26/17 2206  WBC 15.0* 17.2*  --   CREATININE 1.06 1.22  --   LATICACIDVEN  --   --  1.06    Estimated Creatinine Clearance: 53 mL/min (by C-G formula based on SCr of 1.22 mg/dL).    No Known Allergies   Thank you for allowing pharmacy to be a part of this patient's care.  Wynona Neat, PharmD, BCPS  06/27/2017 12:26 AM

## 2017-06-27 NOTE — Progress Notes (Signed)
Patient admitted earlier today.  H&P reviewed.  Patient seen and examined.  No family at bedside.  Patient seems to be mildly agitated.  Does not answer any questions.  Vital signs reviewed.  Appear to be stable.  Diminished air entry at the bases bilaterally in the lungs. S1-S2 is normal regular. Abdomen is soft.  Nontender nondistended.  PEG tube is noted.  Labs reviewed.  No significant changes compared to last night.  Pro-calcitonin 0.2.  Lactic acid level 1.06.  Continue treatment for healthcare associated pneumonia as outlined in the H&P.  Other issues addressed in the H&P.  Potassium level is normal this morning.  We will continue to follow.  Bonnielee Haff 06/27/2017

## 2017-06-27 NOTE — ED Notes (Signed)
Tech drawing labs 

## 2017-06-28 DIAGNOSIS — J181 Lobar pneumonia, unspecified organism: Secondary | ICD-10-CM

## 2017-06-28 LAB — CBC
HCT: 35.3 % — ABNORMAL LOW (ref 39.0–52.0)
Hemoglobin: 11.1 g/dL — ABNORMAL LOW (ref 13.0–17.0)
MCH: 28.5 pg (ref 26.0–34.0)
MCHC: 31.4 g/dL (ref 30.0–36.0)
MCV: 90.7 fL (ref 78.0–100.0)
Platelets: 546 10*3/uL — ABNORMAL HIGH (ref 150–400)
RBC: 3.89 MIL/uL — ABNORMAL LOW (ref 4.22–5.81)
RDW: 17.8 % — ABNORMAL HIGH (ref 11.5–15.5)
WBC: 12 10*3/uL — ABNORMAL HIGH (ref 4.0–10.5)

## 2017-06-28 LAB — BASIC METABOLIC PANEL
Anion gap: 10 (ref 5–15)
BUN: 25 mg/dL — ABNORMAL HIGH (ref 6–20)
CO2: 28 mmol/L (ref 22–32)
Calcium: 10 mg/dL (ref 8.9–10.3)
Chloride: 105 mmol/L (ref 101–111)
Creatinine, Ser: 0.92 mg/dL (ref 0.61–1.24)
GFR calc Af Amer: >60 mL/min (ref >60)
GFR calc non Af Amer: >60 mL/min (ref >60)
Glucose, Bld: 101 mg/dL — ABNORMAL HIGH (ref 65–99)
Potassium: 4.7 mmol/L (ref 3.5–5.1)
Sodium: 143 mmol/L (ref 135–145)

## 2017-06-28 LAB — PROCALCITONIN: Procalcitonin: 0.17 ng/mL

## 2017-06-28 LAB — URINE CULTURE: Culture: 50000 — AB

## 2017-06-28 LAB — GLUCOSE, CAPILLARY: Glucose-Capillary: 114 mg/dL — ABNORMAL HIGH (ref 65–99)

## 2017-06-28 NOTE — Progress Notes (Signed)
Pt answer with nodding head to yes or no question intermittently. Lines and tube secure and patent, chronic foley emptied with no active bleeding, Sister mentioned that a week ago wednesday foley can out at home and trama and bleeding occurred, Foley was replaced at [facility].

## 2017-06-28 NOTE — Plan of Care (Signed)
  Clinical Measurements: Ability to maintain clinical measurements within normal limits will improve 06/28/2017 0212 - Progressing by Tristan Schroeder, RN   Clinical Measurements: Will remain free from infection 06/28/2017 2500 - Progressing by Tristan Schroeder, RN

## 2017-06-28 NOTE — Progress Notes (Signed)
[facility] Progress Note  Patient: Scott Rivera STM:[id number]   PCP: Patient, No Pcp Per DOB: [birth year]   DOA: 06/26/2017   DOS: 06/28/2017   Date of Service: the patient was seen and examined on 06/28/2017  Subjective: Feeling better, breathing better.  More awake.  No nausea no vomiting.  Brief hospital course: Pt. with PMH of CVA with aphasia, PEG tube placement, tracheostomy placement, deconditioning with protein calorie malnutrition; admitted on 06/26/2017, presented with complaint of increased secretions, was found to have healthcare associated pneumonia. Currently further plan is continue IV antibiotics.  Assessment and Plan: 1. HCAP; acute hypoxic respiratory failure   - Presents with multiple complaints, including increased trach secretions and hypoxia at home; he does not have baseline requirement for supplemental O2, noted to have sat in mid-80% with EMS  - CXR findings suggest persistent PNA despite being on last day of Levaquin  - Thick purulent secretions suctioned from trach in ED  - He is afebrile on arrival; WBC has increased to 17,200  - Hospitalized with suspected pneumonia in December and sputum grew MDR pseudomonas; also has hx of MRSA carriage  - Blood cultures collected in ED and empiric vancomycin and cefepime started  - Pseudomonas from recent sputum culture had intermediate sensitivity to cefepime  - Continue empiric abx with vancomycin and meropenem; he is not septic, so do not feel dual antipseudomonal coverage needed for now  - Follow blood cultures, add sputum culture, follow clinical course and trend procalcitonin, continue prn supplemental O2  2. Hx of CVA  - Patient has hx of CVA with residual deficits including aphasia and weakness  - He is dependent on PEG and trach  - No acute deficits  - Continue ASA, Plavix, and statin    3. Hyperkalemia  - Serum potassium is 5.6 on admission  - Continue cardiac monitoring, hydrate with IVF, follow serial  potassium levels   4. Hematuria  - Patient reports gross hematuria  - Most likely secondary to infection, sample sent for culture  - Started on antibiotics as above    5. Hypertension  - BP at goal  - Continue Lopressor    Diet: cardiac diet DVT Prophylaxis: subcutaneous Heparin  Advance goals of care discussion: full code  Family Communication: no family was present at bedside, at the time of interview.   Disposition:  Discharge to be determined. .  Consultants: none Procedures: nonoe  Antibiotics: Anti-infectives (From admission, onward)   Start     Dose/Rate Route Frequency Ordered Stop   06/27/17 1200  vancomycin (VANCOCIN) 500 mg in sodium chloride 0.9 % 100 mL IVPB  Status:  Discontinued     500 mg 100 mL/hr over 60 Minutes Intravenous Every 12 hours 06/27/17 0029 06/28/17 0803   06/27/17 0600  meropenem (MERREM) 1 g in sodium chloride 0.9 % 100 mL IVPB     1 g 200 mL/hr over 30 Minutes Intravenous Every 8 hours 06/27/17 0029     06/27/17 0000  meropenem (MERREM) 1 g in sodium chloride 0.9 % 100 mL IVPB     1 g 200 mL/hr over 30 Minutes Intravenous  Once 06/26/17 2350 06/27/17 0121   06/26/17 2345  vancomycin (VANCOCIN) IVPB 1000 mg/200 mL premix     1,000 mg 200 mL/hr over 60 Minutes Intravenous  Once 06/26/17 2339 06/27/17 0240   06/26/17 2330  ceFEPIme (MAXIPIME) 1 g in sodium chloride 0.9 % 100 mL IVPB  Status:  Discontinued     1  g 200 mL/hr over 30 Minutes Intravenous  Once 06/26/17 2322 06/26/17 2339       Objective: Physical Exam: Vitals:   06/28/17 1106 06/28/17 1255 06/28/17 1257 06/28/17 1606  BP:      Pulse: 75   (!) 102  Resp: 19   20  Temp:      TempSrc:      SpO2: 93% (!) 86% 94% 92%  Weight:      Height:        Intake/Output Summary (Last 24 hours) at 06/28/2017 1624 Last data filed at 06/28/2017 1000 Gross per 24 hour  Intake 2590 ml  Output 1500 ml  Net 1090 ml   Filed Weights   06/26/17 2300 06/27/17 1624 06/28/17 0337    Weight: 65 kg (143 lb 4.8 oz) 68.3 kg (150 lb 9.2 oz) 66.2 kg (145 lb 15.1 oz)   General: Alert, Awake and Oriented to Time, Place and Person. Appear in moderate distress, affect appropriate Eyes: PERRL, Conjunctiva normal ENT: Oral Mucosa clear moist Neck: difficult to assess JVD, no Abnormal Mass Or lumps Cardiovascular: S1 and S2 Present, aortic systolic  Murmur, Peripheral Pulses Present Respiratory: normal respiratory effort, Bilateral Air entry equal and Decreased, no use of accessory muscle, bilateral Crackles, Occasional  wheezes Abdomen: Bowel Sound present, Soft and no tenderness, no hernia Skin: no redness, no Rash, no induration Extremities: no Pedal edema, no calf tenderness Neurologic: Grossly no focal neuro deficit.   Data Reviewed: CBC: Recent Labs  Lab 06/26/17 2154 06/27/17 0415 06/28/17 0649  WBC 17.2* 16.8* 12.0*  NEUTROABS 12.3* 11.5*  --   HGB 11.5* 11.7* 11.1*  HCT 36.6* 36.2* 35.3*  MCV 89.9 89.8 90.7  PLT 541* 551* 106*   Basic Metabolic Panel: Recent Labs  Lab 06/26/17 2154 06/27/17 0415 06/27/17 0945 06/28/17 0649  NA 137 140  --  143  K 5.6* 4.6 4.3 4.7  CL 98* 101  --  105  CO2 28 25  --  28  GLUCOSE 76 72  --  101*  BUN 36* 32*  --  25*  CREATININE 1.22 1.15  --  0.92  CALCIUM 10.1 10.6*  --  10.0    Liver Function Tests: Recent Labs  Lab 06/26/17 2154 06/27/17 0415  AST 43* 27  ALT 35 38  ALKPHOS 132* 119  BILITOT 1.3* 0.7  PROT 7.8 8.1  ALBUMIN 2.8* 2.8*   Recent Labs  Lab 06/26/17 2154  LIPASE 21   No results for input(s): AMMONIA in the last 168 hours. Coagulation Profile: No results for input(s): INR, PROTIME in the last 168 hours. Cardiac Enzymes: No results for input(s): CKTOTAL, CKMB, CKMBINDEX, TROPONINI in the last 168 hours. BNP (last 3 results) No results for input(s): PROBNP in the last 8760 hours. CBG: Recent Labs  Lab 06/27/17 0454 06/27/17 0916 06/28/17 0504  GLUCAP 72 106* 114*   Studies: No  results found.  Scheduled Meds: . aspirin  81 mg Per Tube Daily  . [START ON 07/03/2017] cloNIDine  0.1 mg Transdermal Q Tue  . clopidogrel  75 mg Per Tube Daily  . enoxaparin (LOVENOX) injection  40 mg Subcutaneous Q24H  . escitalopram  10 mg Per Tube Daily  . famotidine  40 mg Per Tube Daily  . free water  100 mL Per Tube Q4H  . gabapentin  100 mg Per Tube Q8H  . pravastatin  20 mg Per Tube q1800  . sodium chloride flush  3 mL Intravenous Q12H  .  thiamine  100 mg Per Tube Daily   Continuous Infusions: . feeding supplement (JEVITY 1.2 CAL) 1,000 mL (06/28/17 0334)  . meropenem (MERREM) IV 1 g (06/28/17 1420)   PRN Meds: acetaminophen **OR** acetaminophen, HYDROcodone-acetaminophen, ipratropium-albuterol, ondansetron **OR** ondansetron (ZOFRAN) IV  Time spent: 35 minutes  Author: Berle Mull, MD [facility] Pager: [phone] 06/28/2017 4:24 PM  If 7PM-7AM, please contact night-coverage at [email or web address], password [facility]

## 2017-06-28 NOTE — Progress Notes (Signed)
Initial Nutrition Assessment  DOCUMENTATION CODES:   Not applicable  INTERVENTION:   -Continue Jevity 1.2 @ 60 ml/hr via *PEG  Tube feeding regimen provides 1728 kcal (100% of needs), 80 grams of protein, and 1162 ml of H2O. (Total free water: 1762 ml)  NUTRITION DIAGNOSIS:   Inadequate oral intake related to dysphagia, inability to eat as evidenced by NPO status.  GOAL:   Patient will meet greater than or equal to 90% of their needs  MONITOR:   Labs, Weight trends, TF tolerance, Skin, I & O's  REASON FOR ASSESSMENT:   New TF    ASSESSMENT:   Scott Rivera is a 67 y.o. male with medical history significant for history of CVA with resulting aphasia, weakness, PEG dependence, and tracheostomy, now presenting to the emergency department for evaluation of increased secretions from his tracheostomy, hypoxia, gross hematuria, and drainage and swelling about his PEG site.  Pt admitted with HCAP and acute respiratory failure with hypoxia.   No family at bedside to obtain further hx. Pt unable to provide hx.   Pt on trach collar; he is NPO at baseline, receiving 100% of his nutrition via PEG. Jevity 1.2 @ 60 ml/hr infusing via PEG. Pt also receiving 100 ml free water flush every 4 hours. Complete regimen provides 1728 kcals, 80 grams protein, and 1162 ml free water (total free water 1762 ml) daily. This meets 100% of estimated kcal needs and 94% of estimated protein needs.   Reviewed wt hx; noted hx of wt loss, however, wt has increased over the past 5 months.   Labs reviewed: CBGS: 114.   NUTRITION - FOCUSED PHYSICAL EXAM:    Most Recent Value  Orbital Region  Mild depletion  Upper Arm Region  No depletion  Thoracic and Lumbar Region  No depletion  Buccal Region  No depletion  Temple Region  No depletion  Clavicle Bone Region  No depletion  Clavicle and Acromion Bone Region  No depletion  Scapular Bone Region  No depletion  Dorsal Hand  No depletion  Patellar Region   Mild depletion  Anterior Thigh Region  No depletion  Posterior Calf Region  Moderate depletion  Edema (RD Assessment)  Moderate  Hair  Reviewed  Eyes  Reviewed  Mouth  Reviewed  Skin  Reviewed  Nails  Reviewed       Diet Order:  Diet NPO time specified  EDUCATION NEEDS:   Not appropriate for education at this time  Skin:  Skin Assessment: Reviewed RN Assessment  Last BM:  PTA  Height:   Ht Readings from Last 1 Encounters:  06/27/17 5\' 6"  (1.676 m)    Weight:   Wt Readings from Last 1 Encounters:  06/29/17 147 lb 0.8 oz (66.7 kg)    Ideal Body Weight:  64.5 kg  BMI:  Body mass index is 23.73 kg/m.  Estimated Nutritional Needs:   Kcal:  1650-1850  Protein:  85-100 grams  Fluid:  > 1.6 L    Weston Kallman A. Jimmye Norman, RD, LDN, CDE Pager: [phone] After hours Pager: [phone]

## 2017-06-28 NOTE — Progress Notes (Signed)
Patient sister at beside would like speech therapy to reevaluate swallowing with speech

## 2017-06-28 NOTE — Progress Notes (Signed)
Pt seen by trach team for consult. No education needed at this time. All necessary equipment available at bedside. Will continue to monitor for progress. 

## 2017-06-29 LAB — RESPIRATORY PANEL BY PCR

## 2017-06-29 LAB — MAGNESIUM: Magnesium: 1.9 mg/dL (ref 1.7–2.4)

## 2017-06-29 LAB — CULTURE, RESPIRATORY W GRAM STAIN

## 2017-06-29 LAB — BRAIN NATRIURETIC PEPTIDE: B Natriuretic Peptide: 10.5 pg/mL (ref 0.0–100.0)

## 2017-06-29 MED ORDER — AMPICILLIN-SULBACTAM SODIUM 3 (2-1) G IJ SOLR
3.0000 g | Freq: Three times a day (TID) | INTRAMUSCULAR | Status: DC
  Administered 2017-06-29 – 2017-07-01 (×7): 3 g via INTRAVENOUS
  Filled 2017-06-29 (×10): qty 3

## 2017-06-29 MED ORDER — BACITRACIN-NEOMYCIN-POLYMYXIN OINTMENT TUBE
TOPICAL_OINTMENT | Freq: Two times a day (BID) | CUTANEOUS | Status: DC
  Administered 2017-06-29: 1 via TOPICAL
  Administered 2017-06-29 – 2017-07-01 (×4): via TOPICAL
  Administered 2017-07-01: 1 via TOPICAL
  Administered 2017-07-02 – 2017-07-03 (×3): via TOPICAL
  Filled 2017-06-29: qty 14.17

## 2017-06-29 NOTE — Care Management Note (Signed)
Case Management Note  Patient Details  Name: Scott Rivera MRN: [id number] Date of Birth: [birth year]  Subjective/Objective:   Pneumonia                Action/Plan: Patient lives at home with  Warehouse manager ( primary caregiver); PCP - Bebe Liter NP Internal Medicine; has private insurance with [facility] with prescription drug coverage; patient has a nurse that goes to his home with [facility] [phone]), CM called [facility], they do not have HHPT/OT/SW; [place] choice offered, [name] chose [facility]; [facility] with [facility] called for arrangements; Hx of CVS, has trach/ PEG tube; CM will continue to follow for progression of care.  Expected Discharge Date:   possibly 07/02/2017               Expected Discharge Plan:  [place]  Discharge planning Services  CM Consult  Choice offered to:  Sibling    HH Arranged:  PT, OT, Nurse's Aide, Speech Therapy, Social Work [employer] Agency:  [facility] [employer]  Status of Service:  In process, will continue to follow  Sherrilyn Rist [phone] 06/29/2017, 3:02 PM

## 2017-06-29 NOTE — Progress Notes (Signed)
Paged MD concerning patient's tele episode of 6 beats of V.Tach then 1 beat regular and then over 20 beat run of V.tach, patient checked patient is alert arousable, vital signs are stable, will continue to monitor Neta Mends RN 2:38 PM 06-29-2017

## 2017-06-29 NOTE — Progress Notes (Signed)
[facility] Progress Note  Patient: Scott Rivera RCV:[id number]   PCP: Patient, No Pcp Per DOB: 02/28/1951   DOA: 06/26/2017   DOS: 06/29/2017   Date of Service: the patient was seen and examined on 06/29/2017  Subjective: No acute events overnight.  No nausea no vomiting.  Brief hospital course: Pt. with PMH of CVA with aphasia, PEG tube placement, tracheostomy placement, deconditioning with protein calorie malnutrition; admitted on 06/26/2017, presented with complaint of increased secretions, was found to have healthcare associated pneumonia. Currently further plan is continue IV antibiotics.  Assessment and Plan: 1. HCAP; acute hypoxic respiratory failure   - Presents with multiple complaints, including increased trach secretions and hypoxia at home; he does not have baseline requirement for supplemental O2, noted to have sat in mid-80% with EMS  - CXR findings suggest persistent PNA despite being on last day of Levaquin  - Thick purulent secretions suctioned from trach in ED  - He is afebrile on arrival; WBC has increased to 17,200  - Hospitalized with suspected pneumonia in December and sputum grew MDR pseudomonas; also has hx of MRSA carriage  Blood cultures negative.  Clinically getting better.  Vancomycin and Merrem was initially started now transition to Unasyn. Patient's primary issue is excessive secretions from his tracheostomy tube, requiring frequent suctioning.  Monitor for now.  2. Hx of CVA  - Patient has hx of CVA with residual deficits including aphasia and weakness  - He is dependent on PEG and trach  - No acute deficits  - Continue ASA, Plavix, and statin    3. Hyperkalemia  - Serum potassium is 5.6 on admission  - Continue cardiac monitoring, hydrate with IVF, follow serial potassium levels   4. Hematuria  - Patient reports gross hematuria  - Most likely secondary to infection, sample sent for culture  - Started on antibiotics as above    5.  Hypertension  - BP at goal  - Continue Lopressor    6.  PEG tube status. Patient's sister was concerned regarding PEG tube having infection although on clinical examination no leakage noted.  No redness or induration of the surrounding area as well.  No significant tenderness at the insertion site or in the surrounding area as well. There is some yellowish drainage although no area or pockets of fluctuation identified. Patient already on IV antibiotics. Use topical barrier cream and Neosporin. Past patient has history with his tubes getting clogged.  Diet: Tube feeding  DVT Prophylaxis: subcutaneous Heparin  Advance goals of care discussion: full code  Family Communication: family was present at bedside, at the time of interview.   Disposition:  Discharge to home with home health per family request.  Consultants: none Procedures: none  Antibiotics: Anti-infectives (From admission, onward)   Start     Dose/Rate Route Frequency Ordered Stop   06/29/17 1600  Ampicillin-Sulbactam (UNASYN) 3 g in sodium chloride 0.9 % 100 mL IVPB     3 g 200 mL/hr over 30 Minutes Intravenous Every 8 hours 06/29/17 1448     06/27/17 1200  vancomycin (VANCOCIN) 500 mg in sodium chloride 0.9 % 100 mL IVPB  Status:  Discontinued     500 mg 100 mL/hr over 60 Minutes Intravenous Every 12 hours 06/27/17 0029 06/28/17 0803   06/27/17 0600  meropenem (MERREM) 1 g in sodium chloride 0.9 % 100 mL IVPB  Status:  Discontinued     1 g 200 mL/hr over 30 Minutes Intravenous Every 8 hours 06/27/17 0029 06/29/17  1439   06/27/17 0000  meropenem (MERREM) 1 g in sodium chloride 0.9 % 100 mL IVPB     1 g 200 mL/hr over 30 Minutes Intravenous  Once 06/26/17 2350 06/27/17 0121   06/26/17 2345  vancomycin (VANCOCIN) IVPB 1000 mg/200 mL premix     1,000 mg 200 mL/hr over 60 Minutes Intravenous  Once 06/26/17 2339 06/27/17 0240   06/26/17 2330  ceFEPIme (MAXIPIME) 1 g in sodium chloride 0.9 % 100 mL IVPB  Status:   Discontinued     1 g 200 mL/hr over 30 Minutes Intravenous  Once 06/26/17 2322 06/26/17 2339       Objective: Physical Exam: Vitals:   06/29/17 1145 06/29/17 1325 06/29/17 1525 06/29/17 1600  BP: (!) 145/52 (!) 151/75  129/69  Pulse: 96  92 79  Resp: (!) 22   18  Temp: 98.3 F (36.8 C)   98.7 F (37.1 C)  TempSrc: Axillary     SpO2: 90%  94% 95%  Weight:      Height:        Intake/Output Summary (Last 24 hours) at 06/29/2017 1826 Last data filed at 06/29/2017 1805 Gross per 24 hour  Intake 2666 ml  Output 2200 ml  Net 466 ml   Filed Weights   06/27/17 1624 06/28/17 0337 06/29/17 0557  Weight: 68.3 kg (150 lb 9.2 oz) 66.2 kg (145 lb 15.1 oz) 66.7 kg (147 lb 0.8 oz)   General: Alert, Awake and Oriented to Time, Place and Person. Appear in moderate distress, affect appropriate Eyes: PERRL, Conjunctiva normal ENT: Oral Mucosa clear moist Neck: difficult to assess JVD, no Abnormal Mass Or lumps Cardiovascular: S1 and S2 Present, aortic systolic  Murmur, Peripheral Pulses Present Respiratory: normal respiratory effort, Bilateral Air entry equal and Decreased, no use of accessory muscle, bilateral Crackles, Occasional  wheezes Abdomen: Bowel Sound present, Soft and no tenderness, no hernia Skin: no redness, no Rash, no induration Extremities: no Pedal edema, no calf tenderness Neurologic: Grossly no focal neuro deficit.   Data Reviewed: CBC: Recent Labs  Lab 06/26/17 2154 06/27/17 0415 06/28/17 0649  WBC 17.2* 16.8* 12.0*  NEUTROABS 12.3* 11.5*  --   HGB 11.5* 11.7* 11.1*  HCT 36.6* 36.2* 35.3*  MCV 89.9 89.8 90.7  PLT 541* 551* 188*   Basic Metabolic Panel: Recent Labs  Lab 06/26/17 2154 06/27/17 0415 06/27/17 0945 06/28/17 0649 06/29/17 0737  NA 137 140  --  143  --   K 5.6* 4.6 4.3 4.7  --   CL 98* 101  --  105  --   CO2 28 25  --  28  --   GLUCOSE 76 72  --  101*  --   BUN 36* 32*  --  25*  --   CREATININE 1.22 1.15  --  0.92  --   CALCIUM 10.1 10.6*   --  10.0  --   MG  --   --   --   --  1.9    Liver Function Tests: Recent Labs  Lab 06/26/17 2154 06/27/17 0415  AST 43* 27  ALT 35 38  ALKPHOS 132* 119  BILITOT 1.3* 0.7  PROT 7.8 8.1  ALBUMIN 2.8* 2.8*   Recent Labs  Lab 06/26/17 2154  LIPASE 21   No results for input(s): AMMONIA in the last 168 hours. Coagulation Profile: No results for input(s): INR, PROTIME in the last 168 hours. Cardiac Enzymes: No results for input(s): CKTOTAL, CKMB, CKMBINDEX, TROPONINI in  the last 168 hours. BNP (last 3 results) No results for input(s): PROBNP in the last 8760 hours. CBG: Recent Labs  Lab 06/27/17 0454 06/27/17 0916 06/28/17 0504  GLUCAP 72 106* 114*   Studies: No results found.  Scheduled Meds: . aspirin  81 mg Per Tube Daily  . [START ON 07/03/2017] cloNIDine  0.1 mg Transdermal Q Tue  . clopidogrel  75 mg Per Tube Daily  . enoxaparin (LOVENOX) injection  40 mg Subcutaneous Q24H  . escitalopram  10 mg Per Tube Daily  . famotidine  40 mg Per Tube Daily  . free water  100 mL Per Tube Q4H  . gabapentin  100 mg Per Tube Q8H  . neomycin-bacitracin-polymyxin   Topical BID  . pravastatin  20 mg Per Tube q1800  . sodium chloride flush  3 mL Intravenous Q12H  . thiamine  100 mg Per Tube Daily   Continuous Infusions: . ampicillin-sulbactam (UNASYN) IV Stopped (06/29/17 1646)  . feeding supplement (JEVITY 1.2 CAL) 1,000 mL (06/28/17 2150)   PRN Meds: acetaminophen **OR** acetaminophen, HYDROcodone-acetaminophen, ipratropium-albuterol, ondansetron **OR** ondansetron (ZOFRAN) IV  Time spent: 35 minutes  Author: Berle Mull, MD [facility] Pager: [phone] 06/29/2017 6:26 PM  If 7PM-7AM, please contact night-coverage at [email or web address], password [facility]

## 2017-06-29 NOTE — Progress Notes (Signed)
ANTIBIOTIC CONSULT NOTE - INITIAL  Pharmacy Consult for Unasyn Indication: pneumonia  No Known Allergies  Patient Measurements: Height: 5\' 6"  (167.6 cm) Weight: 147 lb 0.8 oz (66.7 kg) IBW/kg (Calculated) : 63.8 Adjusted Body Weight:    Vital Signs: Temp: 98.3 F (36.8 C) (03/15 1145) Temp Source: Axillary (03/15 1145) BP: 151/75 (03/15 1325) Pulse Rate: 96 (03/15 1145) Intake/Output from previous day: 03/14 0701 - 03/15 0700 In: 2200 [NG/GT:1360; IV Piggyback:300] Out: 1600 [Urine:1600] Intake/Output from this shift: Total I/O In: -  Out: 600 [Urine:600]  Labs: Recent Labs    06/26/17 2154 06/27/17 0415 06/28/17 0649  WBC 17.2* 16.8* 12.0*  HGB 11.5* 11.7* 11.1*  PLT 541* 551* 546*  CREATININE 1.22 1.15 0.92   Estimated Creatinine Clearance: 70.3 mL/min (by C-G formula based on SCr of 0.92 mg/dL). No results for input(s): VANCOTROUGH, VANCOPEAK, VANCORANDOM, GENTTROUGH, GENTPEAK, GENTRANDOM, TOBRATROUGH, TOBRAPEAK, TOBRARND, AMIKACINPEAK, AMIKACINTROU, AMIKACIN in the last 72 hours.   Microbiology:   Medical History: Past Medical History:  Diagnosis Date  . Anemia due to chronic kidney disease   . Aspiration pneumonia ([place])   . Chronic kidney disease, stage 3 (moderate) ([facility])   . Hypertension   . Stroke [facility])     Assessment:  ID: Levaquin PTA, abx for HCAP. WBC 16.8>12, AF. Scr down 1.15>0.92. Chronic foley - Pseudomonas from recent sputum culture had intermediate sensitivity to cefepime   Unasyn 3/15>> Meropenem 3/13>>3/15 Vancomycin 3/13>>3/14  3/13 strep pna neg 3/12 blood x 2>> 3/12 urine>>50,000 yeast 3/13 Trach: CORYNEBACTERIUM STRIATUM  3/14: RVP: negative  Goal of Therapy:  Eradication of infection  Plan:  D/C Merrem Unasyn 3g IV q 8hrs dose ok down to a CrCl of 30. Pharmacy will sign off. Please reconsult for further dosing assitance.  Scott Rivera, PharmD, BCPS Clinical Staff Pharmacist Pager [phone]  Scott Rivera 06/29/2017,2:46 PM

## 2017-06-29 NOTE — Consult Note (Signed)
[facility] Nurse wound consult note Reason for Consult: Consult requested for skin around G-tube.  If tube is leaking, then topical treatment will be minimally effective.  Barrier cream to repel moisture and protect skin and change the gauze around the tube when soiled.  If leaking continues, then consider tube replacement. Please re-consult if further assistance is needed.  Thank-you,  Julien Girt MSN, [place], [place], [place], [place]

## 2017-06-30 LAB — BASIC METABOLIC PANEL
Anion gap: 9 (ref 5–15)
BUN: 17 mg/dL (ref 6–20)
CO2: 28 mmol/L (ref 22–32)
Calcium: 10.1 mg/dL (ref 8.9–10.3)
Chloride: 102 mmol/L (ref 101–111)
Creatinine, Ser: 0.77 mg/dL (ref 0.61–1.24)
GFR calc Af Amer: >60 mL/min (ref >60)
GFR calc non Af Amer: >60 mL/min (ref >60)
Glucose, Bld: 111 mg/dL — ABNORMAL HIGH (ref 65–99)
Potassium: 4.6 mmol/L (ref 3.5–5.1)
Sodium: 139 mmol/L (ref 135–145)

## 2017-06-30 LAB — GLUCOSE, CAPILLARY: Glucose-Capillary: 116 mg/dL — ABNORMAL HIGH (ref 65–99)

## 2017-06-30 LAB — CBC
HCT: 34.8 % — ABNORMAL LOW (ref 39.0–52.0)
Hemoglobin: 10.9 g/dL — ABNORMAL LOW (ref 13.0–17.0)
MCH: 28.3 pg (ref 26.0–34.0)
MCHC: 31.3 g/dL (ref 30.0–36.0)
MCV: 90.4 fL (ref 78.0–100.0)
Platelets: 511 10*3/uL — ABNORMAL HIGH (ref 150–400)
RBC: 3.85 MIL/uL — ABNORMAL LOW (ref 4.22–5.81)
RDW: 17.1 % — ABNORMAL HIGH (ref 11.5–15.5)
WBC: 11.2 10*3/uL — ABNORMAL HIGH (ref 4.0–10.5)

## 2017-06-30 MED ORDER — ORAL CARE MOUTH RINSE
15.0000 mL | Freq: Two times a day (BID) | OROMUCOSAL | Status: DC
  Administered 2017-06-30 – 2017-07-03 (×7): 15 mL via OROMUCOSAL

## 2017-06-30 MED ORDER — CHLORHEXIDINE GLUCONATE 0.12 % MT SOLN
15.0000 mL | Freq: Two times a day (BID) | OROMUCOSAL | Status: DC
  Administered 2017-06-30 – 2017-07-03 (×7): 15 mL via OROMUCOSAL
  Filled 2017-06-30 (×7): qty 15

## 2017-06-30 NOTE — Progress Notes (Signed)
[facility] Progress Note  Patient: Scott Rivera HCW:[id number]   PCP: Patient, No Pcp Per DOB: Mar 09, 1951   DOA: 06/26/2017   DOS: 06/30/2017   Date of Service: the patient was seen and examined on 06/30/2017  Subjective: Patient denies any acute complaint no chest pain abdominal pain no nausea no vomiting.  Answers with nodding his head.  Brief hospital course: Pt. with PMH of CVA with aphasia, PEG tube placement, tracheostomy placement, deconditioning with protein calorie malnutrition; admitted on 06/26/2017, presented with complaint of increased secretions, was found to have healthcare associated pneumonia. Currently further plan is continue IV antibiotics.  Assessment and Plan: 1. HCAP; acute hypoxic respiratory failure   - Presents with multiple complaints, including increased trach secretions and hypoxia at home; he does not have baseline requirement for supplemental O2, noted to have sat in mid-80% with EMS  - CXR findings suggest persistent PNA despite being on last day of Levaquin  - He is afebrile on arrival; WBC has increased to 17,200  - Hospitalized with suspected pneumonia in December and sputum grew MDR pseudomonas; also has hx of MRSA carriage  Blood cultures negative.  Clinically getting better.  Vancomycin and Merrem was initially started now transition to Unasyn. Patient's primary issue is excessive secretions from his tracheostomy tube, requiring frequent suctioning.  Monitor for now.  2. Hx of CVA  - Patient has hx of CVA with residual deficits including aphasia and weakness  - He is dependent on PEG and trach  - No acute deficits  - Continue ASA, Plavix, and statin    3. Hyperkalemia  - Serum potassium is 5.6 on admission  - Continue cardiac monitoring, hydrate with IVF, follow serial potassium levels   4. Hematuria  - Patient reports gross hematuria  - Most likely secondary to infection, sample sent for culture  - Started on antibiotics as above     5. Hypertension  - BP at goal  - Continue Lopressor    6.  PEG tube status. Patient's sister was concerned regarding PEG tube having infection although on clinical examination no leakage noted.  No redness or induration of the surrounding area as well.  No significant tenderness at the insertion site or in the surrounding area as well. There is some yellowish drainage although no area or pockets of fluctuation identified. Patient already on IV antibiotics. Use topical barrier cream and Neosporin. Past patient has history with his tubes getting clogged.  Diet: Tube feeding  DVT Prophylaxis: subcutaneous Heparin  Advance goals of care discussion: full code  Family Communication: family was present at bedside, at the time of interview.   Disposition:  Discharge to home with home health per family request.  Consultants: none Procedures: none  Antibiotics: Anti-infectives (From admission, onward)   Start     Dose/Rate Route Frequency Ordered Stop   06/29/17 1600  Ampicillin-Sulbactam (UNASYN) 3 g in sodium chloride 0.9 % 100 mL IVPB     3 g 200 mL/hr over 30 Minutes Intravenous Every 8 hours 06/29/17 1448     06/27/17 1200  vancomycin (VANCOCIN) 500 mg in sodium chloride 0.9 % 100 mL IVPB  Status:  Discontinued     500 mg 100 mL/hr over 60 Minutes Intravenous Every 12 hours 06/27/17 0029 06/28/17 0803   06/27/17 0600  meropenem (MERREM) 1 g in sodium chloride 0.9 % 100 mL IVPB  Status:  Discontinued     1 g 200 mL/hr over 30 Minutes Intravenous Every 8 hours 06/27/17 0029  06/29/17 1439   06/27/17 0000  meropenem (MERREM) 1 g in sodium chloride 0.9 % 100 mL IVPB     1 g 200 mL/hr over 30 Minutes Intravenous  Once 06/26/17 2350 06/27/17 0121   06/26/17 2345  vancomycin (VANCOCIN) IVPB 1000 mg/200 mL premix     1,000 mg 200 mL/hr over 60 Minutes Intravenous  Once 06/26/17 2339 06/27/17 0240   06/26/17 2330  ceFEPIme (MAXIPIME) 1 g in sodium chloride 0.9 % 100 mL IVPB  Status:   Discontinued     1 g 200 mL/hr over 30 Minutes Intravenous  Once 06/26/17 2322 06/26/17 2339       Objective: Physical Exam: Vitals:   06/30/17 0808 06/30/17 1138 06/30/17 1230 06/30/17 1559  BP:   (!) 145/65   Pulse: 99 91 77 82  Resp: 20 20 18 18   Temp:   98.4 F (36.9 C)   TempSrc:   Oral   SpO2: 97% 93% 95% 95%  Weight:      Height:        Intake/Output Summary (Last 24 hours) at 06/30/2017 1619 Last data filed at 06/30/2017 1500 Gross per 24 hour  Intake 1910 ml  Output 2200 ml  Net -290 ml   Filed Weights   06/28/17 0337 06/29/17 0557 06/30/17 0536  Weight: 66.2 kg (145 lb 15.1 oz) 66.7 kg (147 lb 0.8 oz) 65.9 kg (145 lb 4.5 oz)   General: Alert, Awake and Oriented to Time, Place and Person. Appear in moderate distress, affect appropriate Eyes: PERRL, Conjunctiva normal ENT: Oral Mucosa clear moist Neck: difficult to assess JVD, no Abnormal Mass Or lumps Cardiovascular: S1 and S2 Present, aortic systolic  Murmur, Peripheral Pulses Present Respiratory: normal respiratory effort, Bilateral Air entry equal and Decreased, no use of accessory muscle, bilateral Crackles, Occasional  wheezes Abdomen: Bowel Sound present, Soft and no tenderness, no hernia Skin: no redness, no Rash, no induration Extremities: no Pedal edema, no calf tenderness Neurologic: Grossly no focal neuro deficit.   Data Reviewed: CBC: Recent Labs  Lab 06/26/17 2154 06/27/17 0415 06/28/17 0649 06/30/17 1411  WBC 17.2* 16.8* 12.0* 11.2*  NEUTROABS 12.3* 11.5*  --   --   HGB 11.5* 11.7* 11.1* 10.9*  HCT 36.6* 36.2* 35.3* 34.8*  MCV 89.9 89.8 90.7 90.4  PLT 541* 551* 546* 546*   Basic Metabolic Panel: Recent Labs  Lab 06/26/17 2154 06/27/17 0415 06/27/17 0945 06/28/17 0649 06/29/17 0737 06/30/17 1411  NA 137 140  --  143  --  139  K 5.6* 4.6 4.3 4.7  --  4.6  CL 98* 101  --  105  --  102  CO2 28 25  --  28  --  28  GLUCOSE 76 72  --  101*  --  111*  BUN 36* 32*  --  25*  --  17    CREATININE 1.22 1.15  --  0.92  --  0.77  CALCIUM 10.1 10.6*  --  10.0  --  10.1  MG  --   --   --   --  1.9  --     Liver Function Tests: Recent Labs  Lab 06/26/17 2154 06/27/17 0415  AST 43* 27  ALT 35 38  ALKPHOS 132* 119  BILITOT 1.3* 0.7  PROT 7.8 8.1  ALBUMIN 2.8* 2.8*   Recent Labs  Lab 06/26/17 2154  LIPASE 21   No results for input(s): AMMONIA in the last 168 hours. Coagulation Profile: No results  for input(s): INR, PROTIME in the last 168 hours. Cardiac Enzymes: No results for input(s): CKTOTAL, CKMB, CKMBINDEX, TROPONINI in the last 168 hours. BNP (last 3 results) No results for input(s): PROBNP in the last 8760 hours. CBG: Recent Labs  Lab 06/27/17 0454 06/27/17 0916 06/28/17 0504 06/30/17 0535  GLUCAP 72 106* 114* 116*   Studies: No results found.  Scheduled Meds: . aspirin  81 mg Per Tube Daily  . chlorhexidine  15 mL Mouth Rinse BID  . [START ON 07/03/2017] cloNIDine  0.1 mg Transdermal Q Tue  . clopidogrel  75 mg Per Tube Daily  . enoxaparin (LOVENOX) injection  40 mg Subcutaneous Q24H  . escitalopram  10 mg Per Tube Daily  . famotidine  40 mg Per Tube Daily  . free water  100 mL Per Tube Q4H  . gabapentin  100 mg Per Tube Q8H  . mouth rinse  15 mL Mouth Rinse q12n4p  . neomycin-bacitracin-polymyxin   Topical BID  . pravastatin  20 mg Per Tube q1800  . sodium chloride flush  3 mL Intravenous Q12H  . thiamine  100 mg Per Tube Daily   Continuous Infusions: . ampicillin-sulbactam (UNASYN) IV Stopped (06/30/17 0920)  . feeding supplement (JEVITY 1.2 CAL) 1,000 mL (06/30/17 0930)   PRN Meds: acetaminophen **OR** acetaminophen, HYDROcodone-acetaminophen, ipratropium-albuterol, ondansetron **OR** ondansetron (ZOFRAN) IV  Time spent: 35 minutes  Author: Berle Mull, MD [facility] Pager: [phone] 06/30/2017 4:19 PM  If 7PM-7AM, please contact night-coverage at [email or web address], password [facility]

## 2017-06-30 NOTE — Progress Notes (Signed)
Confirmed with pharmacy the ordered medications are all clear to be crushed and administered via PEG tube.

## 2017-06-30 NOTE — Progress Notes (Signed)
Page to Dr Posey Pronto  3e10. pt refused labs earlier, lab took it upon themselves to cancel all lab orders for today. please re-enter as pt agrees to blood draw now.

## 2017-06-30 NOTE — Progress Notes (Signed)
Patient refused to allow phlebotomy to draw his labs today, twice before finally agreeing this afternoon.

## 2017-06-30 NOTE — Progress Notes (Signed)
Patient resting comfortably during shift report. Denies complaints.  

## 2017-07-01 LAB — CBC
HCT: 37.9 % — ABNORMAL LOW (ref 39.0–52.0)
Hemoglobin: 11.3 g/dL — ABNORMAL LOW (ref 13.0–17.0)
MCH: 27.2 pg (ref 26.0–34.0)
MCHC: 29.8 g/dL — ABNORMAL LOW (ref 30.0–36.0)
MCV: 91.3 fL (ref 78.0–100.0)
Platelets: 477 10*3/uL — ABNORMAL HIGH (ref 150–400)
RBC: 4.15 MIL/uL — ABNORMAL LOW (ref 4.22–5.81)
RDW: 17.4 % — ABNORMAL HIGH (ref 11.5–15.5)
WBC: 11 10*3/uL — ABNORMAL HIGH (ref 4.0–10.5)

## 2017-07-01 LAB — CULTURE, BLOOD (ROUTINE X 2)
Culture: NO GROWTH
Culture: NO GROWTH
Special Requests: ADEQUATE
Special Requests: ADEQUATE

## 2017-07-01 LAB — GLUCOSE, CAPILLARY: Glucose-Capillary: 92 mg/dL (ref 65–99)

## 2017-07-01 MED ORDER — AMOXICILLIN-POT CLAVULANATE 400-57 MG/5ML PO SUSR
800.0000 mg | Freq: Two times a day (BID) | ORAL | Status: DC
  Administered 2017-07-01 – 2017-07-03 (×4): 800 mg
  Filled 2017-07-01 (×6): qty 10

## 2017-07-01 NOTE — Progress Notes (Signed)
[facility] Progress Note  Patient: Scott Rivera BDZ:[id number]   PCP: Patient, No Pcp Per DOB: 1951/04/15   DOA: 06/26/2017   DOS: 07/01/2017   Date of Service: the patient was seen and examined on 07/01/2017  Subjective: Awake, no acute commands.  No acute complaint.  Occasional narrow complex tachycardia noted although do not suspect these are V. tach.  Brief hospital course: Pt. with PMH of CVA with aphasia, PEG tube placement, tracheostomy placement, deconditioning with protein calorie malnutrition; admitted on 06/26/2017, presented with complaint of increased secretions, was found to have healthcare associated pneumonia. Currently further plan is continue IV antibiotics.  Assessment and Plan: 1. HCAP; acute hypoxic respiratory failure   - Presents with multiple complaints, including increased trach secretions and hypoxia at home; he does not have baseline requirement for supplemental O2, noted to have sat in mid-80% with EMS  - CXR findings suggest persistent PNA despite being on last day of Levaquin  - He is afebrile on arrival; WBC has increased to 17,200  - Hospitalized with suspected pneumonia in December and sputum grew MDR pseudomonas; also has hx of MRSA carriage  Blood cultures negative.  Clinically getting better.  Vancomycin and Merrem was initially started then transition to Unasyn.  Now on Augmentin Patient's primary issue is excessive secretions from his tracheostomy tube, requiring frequent suctioning.  Monitor for now.  Secretions are decreasing hopefully can be discharged tomorrow  2. Hx of CVA  - Patient has hx of CVA with residual deficits including aphasia and weakness  - He is dependent on PEG and trach  - No acute deficits  - Continue ASA, Plavix, and statin    3. Hyperkalemia  - Serum potassium is 5.6 on admission  - Continue cardiac monitoring, hydrate with IVF, follow serial potassium levels   4. Hematuria  - Patient reports gross hematuria  -  Most likely secondary to infection, sample sent for culture  - Started on antibiotics as above    5. Hypertension  - BP at goal  - Continue Lopressor    6.  PEG tube status. Patient's sister was concerned regarding PEG tube having infection although on clinical examination no leakage noted.  No redness or induration of the surrounding area as well.  No significant tenderness at the insertion site or in the surrounding area as well. There is some yellowish drainage although no area or pockets of fluctuation identified. Patient already on IV antibiotics. Use topical barrier cream and Neosporin. Past patient has history with his tubes getting clogged.  Diet: Tube feeding  DVT Prophylaxis: subcutaneous Heparin  Advance goals of care discussion: full code  Family Communication: family was present at bedside, at the time of interview.   Disposition:  Discharge to home with home health per family request.  Likely tomorrow  Consultants: none Procedures: none  Antibiotics: Anti-infectives (From admission, onward)   Start     Dose/Rate Route Frequency Ordered Stop   07/01/17 2200  amoxicillin-clavulanate (AUGMENTIN) 400-57 MG/5ML suspension 800 mg     800 mg of amoxicillin Per Tube 2 times daily 07/01/17 1703     06/29/17 1600  Ampicillin-Sulbactam (UNASYN) 3 g in sodium chloride 0.9 % 100 mL IVPB  Status:  Discontinued     3 g 200 mL/hr over 30 Minutes Intravenous Every 8 hours 06/29/17 1448 07/01/17 1702   06/27/17 1200  vancomycin (VANCOCIN) 500 mg in sodium chloride 0.9 % 100 mL IVPB  Status:  Discontinued     500 mg  100 mL/hr over 60 Minutes Intravenous Every 12 hours 06/27/17 0029 06/28/17 0803   06/27/17 0600  meropenem (MERREM) 1 g in sodium chloride 0.9 % 100 mL IVPB  Status:  Discontinued     1 g 200 mL/hr over 30 Minutes Intravenous Every 8 hours 06/27/17 0029 06/29/17 1439   06/27/17 0000  meropenem (MERREM) 1 g in sodium chloride 0.9 % 100 mL IVPB     1 g 200 mL/hr over  30 Minutes Intravenous  Once 06/26/17 2350 06/27/17 0121   06/26/17 2345  vancomycin (VANCOCIN) IVPB 1000 mg/200 mL premix     1,000 mg 200 mL/hr over 60 Minutes Intravenous  Once 06/26/17 2339 06/27/17 0240   06/26/17 2330  ceFEPIme (MAXIPIME) 1 g in sodium chloride 0.9 % 100 mL IVPB  Status:  Discontinued     1 g 200 mL/hr over 30 Minutes Intravenous  Once 06/26/17 2322 06/26/17 2339       Objective: Physical Exam: Vitals:   07/01/17 0754 07/01/17 1157 07/01/17 1300 07/01/17 1449  BP:   140/64   Pulse:   (!) 58   Resp:   18   Temp:      TempSrc:      SpO2: 92% 98% 98% 92%  Weight:      Height:        Intake/Output Summary (Last 24 hours) at 07/01/2017 1752 Last data filed at 07/01/2017 1700 Gross per 24 hour  Intake 2339 ml  Output 2200 ml  Net 139 ml   Filed Weights   06/29/17 0557 06/30/17 0536 07/01/17 0449  Weight: 66.7 kg (147 lb 0.8 oz) 65.9 kg (145 lb 4.5 oz) 66.3 kg (146 lb 2.6 oz)   General: Alert, Awake and Oriented to Time, Place and Person. Appear in moderate distress, affect appropriate Eyes: PERRL, Conjunctiva normal ENT: Oral Mucosa clear moist Neck: difficult to assess JVD, no Abnormal Mass Or lumps Cardiovascular: S1 and S2 Present, aortic systolic  Murmur, Peripheral Pulses Present Respiratory: normal respiratory effort, Bilateral Air entry equal and Decreased, no use of accessory muscle, bilateral Crackles, Occasional  wheezes Abdomen: Bowel Sound present, Soft and no tenderness, no hernia Skin: no redness, no Rash, no induration Extremities: no Pedal edema, no calf tenderness Neurologic: Grossly no focal neuro deficit.   Data Reviewed: CBC: Recent Labs  Lab 06/26/17 2154 06/27/17 0415 06/28/17 0649 06/30/17 1411 07/01/17 0655  WBC 17.2* 16.8* 12.0* 11.2* 11.0*  NEUTROABS 12.3* 11.5*  --   --   --   HGB 11.5* 11.7* 11.1* 10.9* 11.3*  HCT 36.6* 36.2* 35.3* 34.8* 37.9*  MCV 89.9 89.8 90.7 90.4 91.3  PLT 541* 551* 546* 511* 477*   Basic  Metabolic Panel: Recent Labs  Lab 06/26/17 2154 06/27/17 0415 06/27/17 0945 06/28/17 0649 06/29/17 0737 06/30/17 1411  NA 137 140  --  143  --  139  K 5.6* 4.6 4.3 4.7  --  4.6  CL 98* 101  --  105  --  102  CO2 28 25  --  28  --  28  GLUCOSE 76 72  --  101*  --  111*  BUN 36* 32*  --  25*  --  17  CREATININE 1.22 1.15  --  0.92  --  0.77  CALCIUM 10.1 10.6*  --  10.0  --  10.1  MG  --   --   --   --  1.9  --     Liver Function Tests: Recent Labs  Lab 06/26/17 2154 06/27/17 0415  AST 43* 27  ALT 35 38  ALKPHOS 132* 119  BILITOT 1.3* 0.7  PROT 7.8 8.1  ALBUMIN 2.8* 2.8*   Recent Labs  Lab 06/26/17 2154  LIPASE 21   No results for input(s): AMMONIA in the last 168 hours. Coagulation Profile: No results for input(s): INR, PROTIME in the last 168 hours. Cardiac Enzymes: No results for input(s): CKTOTAL, CKMB, CKMBINDEX, TROPONINI in the last 168 hours. BNP (last 3 results) No results for input(s): PROBNP in the last 8760 hours. CBG: Recent Labs  Lab 06/27/17 0454 06/27/17 0916 06/28/17 0504 06/30/17 0535 07/01/17 0744  GLUCAP 72 106* 114* 116* 92   Studies: No results found.  Scheduled Meds: . amoxicillin-clavulanate  800 mg of amoxicillin Per Tube BID  . aspirin  81 mg Per Tube Daily  . chlorhexidine  15 mL Mouth Rinse BID  . [START ON 07/03/2017] cloNIDine  0.1 mg Transdermal Q Tue  . clopidogrel  75 mg Per Tube Daily  . enoxaparin (LOVENOX) injection  40 mg Subcutaneous Q24H  . escitalopram  10 mg Per Tube Daily  . famotidine  40 mg Per Tube Daily  . free water  100 mL Per Tube Q4H  . gabapentin  100 mg Per Tube Q8H  . mouth rinse  15 mL Mouth Rinse q12n4p  . neomycin-bacitracin-polymyxin   Topical BID  . pravastatin  20 mg Per Tube q1800  . sodium chloride flush  3 mL Intravenous Q12H  . thiamine  100 mg Per Tube Daily   Continuous Infusions: . feeding supplement (JEVITY 1.2 CAL) 1,000 mL (07/01/17 1013)   PRN Meds: acetaminophen **OR**  acetaminophen, HYDROcodone-acetaminophen, ipratropium-albuterol, ondansetron **OR** ondansetron (ZOFRAN) IV  Time spent: 35 minutes  Author: Berle Mull, MD [facility] Pager: [phone] 07/01/2017 5:52 PM  If 7PM-7AM, please contact night-coverage at [email or web address], password [facility]

## 2017-07-01 NOTE — Progress Notes (Signed)
Per CCMD pt had a 10 beat run of Vtach just before 5pm. Page to MD to notify.

## 2017-07-01 NOTE — Progress Notes (Signed)
Patient sleeping during shift report.    Patient woke, and phlebotomy completed blood draw successfully promptly following.

## 2017-07-02 LAB — LEGIONELLA PNEUMOPHILA SEROGP 1 UR AG: L. pneumophila Serogp 1 Ur Ag: NEGATIVE

## 2017-07-02 LAB — BASIC METABOLIC PANEL
Anion gap: 8 (ref 5–15)
BUN: 17 mg/dL (ref 6–20)
CO2: 29 mmol/L (ref 22–32)
Calcium: 10.1 mg/dL (ref 8.9–10.3)
Chloride: 100 mmol/L — ABNORMAL LOW (ref 101–111)
Creatinine, Ser: 0.66 mg/dL (ref 0.61–1.24)
GFR calc Af Amer: >60 mL/min (ref >60)
GFR calc non Af Amer: >60 mL/min (ref >60)
Glucose, Bld: 112 mg/dL — ABNORMAL HIGH (ref 65–99)
Potassium: 4.2 mmol/L (ref 3.5–5.1)
Sodium: 137 mmol/L (ref 135–145)

## 2017-07-02 LAB — CBC
HCT: 37.7 % — ABNORMAL LOW (ref 39.0–52.0)
Hemoglobin: 11.8 g/dL — ABNORMAL LOW (ref 13.0–17.0)
MCH: 28.3 pg (ref 26.0–34.0)
MCHC: 31.3 g/dL (ref 30.0–36.0)
MCV: 90.4 fL (ref 78.0–100.0)
Platelets: 515 10*3/uL — ABNORMAL HIGH (ref 150–400)
RBC: 4.17 MIL/uL — ABNORMAL LOW (ref 4.22–5.81)
RDW: 16.8 % — ABNORMAL HIGH (ref 11.5–15.5)
WBC: 12.3 10*3/uL — ABNORMAL HIGH (ref 4.0–10.5)

## 2017-07-02 LAB — GLUCOSE, CAPILLARY: Glucose-Capillary: 91 mg/dL (ref 65–99)

## 2017-07-02 MED ORDER — SCOPOLAMINE 1 MG/3DAYS TD PT72
1.0000 | MEDICATED_PATCH | TRANSDERMAL | Status: DC
  Administered 2017-07-02: 1.5 mg via TRANSDERMAL
  Filled 2017-07-02: qty 1

## 2017-07-02 MED ORDER — FAMOTIDINE 40 MG/5ML PO SUSR
40.0000 mg | Freq: Every day | ORAL | Status: DC
  Administered 2017-07-02 – 2017-07-03 (×2): 40 mg
  Filled 2017-07-02 (×2): qty 5

## 2017-07-02 MED ORDER — HYDROCODONE-ACETAMINOPHEN 7.5-325 MG/15ML PO SOLN: 5.0000 mL | Freq: Four times a day (QID) | ORAL | Status: DC | PRN

## 2017-07-02 NOTE — Care Management Important Message (Signed)
Important Message  Patient Details  Name: Scott Rivera MRN: [id number] Date of Birth: 26-Nov-1950   Medicare Important Message Given:  Yes    [name] P Marion 07/02/2017, 1:43 PM

## 2017-07-02 NOTE — Progress Notes (Signed)
[facility] Progress Note  Patient: Scott Rivera [id number]:[id number]   PCP: Patient, No Pcp Per DOB: 1950/12/15   DOA: 06/26/2017   DOS: 07/02/2017   Date of Service: the patient was seen and examined on 07/02/2017  Subjective: No acute complaint, no nausea no vomiting.  Overnight secretion increased.  Brief hospital course: Pt. with PMH of CVA with aphasia, PEG tube placement, tracheostomy placement, deconditioning with protein calorie malnutrition; admitted on 06/26/2017, presented with complaint of increased secretions, was found to have healthcare associated pneumonia. Currently further plan is continue IV antibiotics.  Assessment and Plan: 1. HCAP; acute hypoxic respiratory failure   - Presents with multiple complaints, including increased trach secretions and hypoxia at home; he does not have baseline requirement for supplemental O2, noted to have sat in mid-80% with EMS  - CXR findings suggest persistent PNA despite being on last day of Levaquin  - He is afebrile on arrival; WBC has increased to 17,200  - Hospitalized with suspected pneumonia in December and sputum grew MDR pseudomonas; also has hx of MRSA carriage  Blood cultures negative.  Clinically getting better.  Vancomycin and Merrem was initially started then transition to Unasyn.  Now on Augmentin Patient's primary issue is excessive secretions from his tracheostomy tube, requiring frequent suctioning.  Per RT as well as night nurse secretions were significantly increased last night.  Will try scopolamine patch and monitor today.  2. Hx of CVA  - Patient has hx of CVA with residual deficits including aphasia and weakness  - He is dependent on PEG and trach  - No acute deficits  - Continue ASA, Plavix, and statin    3. Hyperkalemia  - Serum potassium is 5.6 on admission  - Continue cardiac monitoring, hydrate with IVF, follow serial potassium levels   4. Hematuria  - Patient reports gross hematuria  - Most likely  secondary to infection, sample sent for culture  - Started on antibiotics as above    5. Hypertension  - BP at goal  - Continue Lopressor    6.  PEG tube status. Patient's sister was concerned regarding PEG tube having infection although on clinical examination no leakage noted.  No redness or induration of the surrounding area as well.  No significant tenderness at the insertion site or in the surrounding area as well. There is some yellowish drainage although no area or pockets of fluctuation identified. Patient already on IV antibiotics. Use topical barrier cream and Neosporin. Past patient has history with his tubes getting clogged. Discontinue free water flush  Diet: Tube feeding  DVT Prophylaxis: subcutaneous Heparin  Advance goals of care discussion: full code  Family Communication: no family was present at bedside, at the time of interview.   Disposition:  Discharge to home with home health per family request.  Likely tomorrow 07/03/2017, depending on improvement in secretion.  Consultants: none Procedures: none  Antibiotics: Anti-infectives (From admission, onward)   Start     Dose/Rate Route Frequency Ordered Stop   07/01/17 2200  amoxicillin-clavulanate (AUGMENTIN) 400-57 MG/5ML suspension 800 mg     800 mg of amoxicillin Per Tube 2 times daily 07/01/17 1703     06/29/17 1600  Ampicillin-Sulbactam (UNASYN) 3 g in sodium chloride 0.9 % 100 mL IVPB  Status:  Discontinued     3 g 200 mL/hr over 30 Minutes Intravenous Every 8 hours 06/29/17 1448 07/01/17 1702   06/27/17 1200  vancomycin (VANCOCIN) 500 mg in sodium chloride 0.9 % 100 mL IVPB  Status:  Discontinued     500 mg 100 mL/hr over 60 Minutes Intravenous Every 12 hours 06/27/17 0029 06/28/17 0803   06/27/17 0600  meropenem (MERREM) 1 g in sodium chloride 0.9 % 100 mL IVPB  Status:  Discontinued     1 g 200 mL/hr over 30 Minutes Intravenous Every 8 hours 06/27/17 0029 06/29/17 1439   06/27/17 0000  meropenem  (MERREM) 1 g in sodium chloride 0.9 % 100 mL IVPB     1 g 200 mL/hr over 30 Minutes Intravenous  Once 06/26/17 2350 06/27/17 0121   06/26/17 2345  vancomycin (VANCOCIN) IVPB 1000 mg/200 mL premix     1,000 mg 200 mL/hr over 60 Minutes Intravenous  Once 06/26/17 2339 06/27/17 0240   06/26/17 2330  ceFEPIme (MAXIPIME) 1 g in sodium chloride 0.9 % 100 mL IVPB  Status:  Discontinued     1 g 200 mL/hr over 30 Minutes Intravenous  Once 06/26/17 2322 06/26/17 2339       Objective: Physical Exam: Vitals:   07/02/17 0523 07/02/17 0900 07/02/17 0928 07/02/17 1218  BP: (!) 159/79  124/80   Pulse: 83 85 86 89  Resp: 20 20 20 18   Temp: (!) 97.5 F (36.4 C)  97.7 F (36.5 C)   TempSrc: Oral  Oral   SpO2: 95% 96% 97% 100%  Weight: 65.8 kg (145 lb)     Height:        Intake/Output Summary (Last 24 hours) at 07/02/2017 1559 Last data filed at 07/02/2017 1100 Gross per 24 hour  Intake 2120 ml  Output 1401 ml  Net 719 ml   Filed Weights   06/30/17 0536 07/01/17 0449 07/02/17 0523  Weight: 65.9 kg (145 lb 4.5 oz) 66.3 kg (146 lb 2.6 oz) 65.8 kg (145 lb)   General: Alert, Awake and Oriented to Time, Place and Person. Appear in moderate distress, affect appropriate Eyes: PERRL, Conjunctiva normal ENT: Oral Mucosa clear moist Neck: difficult to assess JVD, no Abnormal Mass Or lumps Cardiovascular: S1 and S2 Present, aortic systolic  Murmur, Peripheral Pulses Present Respiratory: normal respiratory effort, Bilateral Air entry equal and Decreased, no use of accessory muscle, bilateral Crackles, Occasional  wheezes Abdomen: Bowel Sound present, Soft and no tenderness, no hernia Skin: no redness, no Rash, no induration Extremities: no Pedal edema, no calf tenderness Neurologic: Grossly no focal neuro deficit.   Data Reviewed: CBC: Recent Labs  Lab 06/26/17 2154 06/27/17 0415 06/28/17 0649 06/30/17 1411 07/01/17 0655 07/02/17 0341  WBC 17.2* 16.8* 12.0* 11.2* 11.0* 12.3*  NEUTROABS  12.3* 11.5*  --   --   --   --   HGB 11.5* 11.7* 11.1* 10.9* 11.3* 11.8*  HCT 36.6* 36.2* 35.3* 34.8* 37.9* 37.7*  MCV 89.9 89.8 90.7 90.4 91.3 90.4  PLT 541* 551* 546* 511* 477* 440*   Basic Metabolic Panel: Recent Labs  Lab 06/26/17 2154 06/27/17 0415 06/27/17 0945 06/28/17 0649 06/29/17 0737 06/30/17 1411 07/02/17 0341  NA 137 140  --  143  --  139 137  K 5.6* 4.6 4.3 4.7  --  4.6 4.2  CL 98* 101  --  105  --  102 100*  CO2 28 25  --  28  --  28 29  GLUCOSE 76 72  --  101*  --  111* 112*  BUN 36* 32*  --  25*  --  17 17  CREATININE 1.22 1.15  --  0.92  --  0.77 0.66  CALCIUM  10.1 10.6*  --  10.0  --  10.1 10.1  MG  --   --   --   --  1.9  --   --     Liver Function Tests: Recent Labs  Lab 06/26/17 2154 06/27/17 0415  AST 43* 27  ALT 35 38  ALKPHOS 132* 119  BILITOT 1.3* 0.7  PROT 7.8 8.1  ALBUMIN 2.8* 2.8*   Recent Labs  Lab 06/26/17 2154  LIPASE 21   No results for input(s): AMMONIA in the last 168 hours. Coagulation Profile: No results for input(s): INR, PROTIME in the last 168 hours. Cardiac Enzymes: No results for input(s): CKTOTAL, CKMB, CKMBINDEX, TROPONINI in the last 168 hours. BNP (last 3 results) No results for input(s): PROBNP in the last 8760 hours. CBG: Recent Labs  Lab 06/27/17 0916 06/28/17 0504 06/30/17 0535 07/01/17 0744 07/02/17 0544  GLUCAP 106* 114* 116* 92 91   Studies: No results found.  Scheduled Meds: . amoxicillin-clavulanate  800 mg of amoxicillin Per Tube BID  . aspirin  81 mg Per Tube Daily  . chlorhexidine  15 mL Mouth Rinse BID  . [START ON 07/03/2017] cloNIDine  0.1 mg Transdermal Q Tue  . clopidogrel  75 mg Per Tube Daily  . enoxaparin (LOVENOX) injection  40 mg Subcutaneous Q24H  . escitalopram  10 mg Per Tube Daily  . famotidine  40 mg Per Tube Daily  . gabapentin  100 mg Per Tube Q8H  . mouth rinse  15 mL Mouth Rinse q12n4p  . neomycin-bacitracin-polymyxin   Topical BID  . pravastatin  20 mg Per Tube q1800   . scopolamine  1 patch Transdermal Q72H  . sodium chloride flush  3 mL Intravenous Q12H   Continuous Infusions: . feeding supplement (JEVITY 1.2 CAL) 1,000 mL (07/02/17 1548)   PRN Meds: acetaminophen **OR** acetaminophen, HYDROcodone-acetaminophen, ipratropium-albuterol, ondansetron **OR** ondansetron (ZOFRAN) IV  Time spent: 35 minutes  Author: Berle Mull, MD [facility] Pager: [phone] 07/02/2017 3:59 PM  If 7PM-7AM, please contact night-coverage at [email or web address], password [facility]

## 2017-07-02 NOTE — Progress Notes (Addendum)
Social worker from [facility] called RN. SW states pt will need to a GI specialist and urologist once discharged.

## 2017-07-02 NOTE — Care Management Important Message (Signed)
Important Message  Patient Details  Name: Scott Rivera MRN: [id number] Date of Birth: January 15, 1951   Medicare Important Message Given:  Yes    Halona Amstutz Montine Circle 07/02/2017, 12:47 PM

## 2017-07-02 NOTE — Plan of Care (Signed)
  Health Behavior/Discharge Planning: Ability to manage health-related needs will improve 07/02/2017 0234 - Progressing by Theador Hawthorne, RN   Clinical Measurements: Will remain free from infection 07/02/2017 0234 - Progressing by Theador Hawthorne, RN   Clinical Measurements: Respiratory complications will improve 07/02/2017 0234 - Progressing by Theador Hawthorne, RN   Coping: Level of anxiety will decrease 07/02/2017 0234 - Progressing by Theador Hawthorne, RN   Pain Managment: General experience of comfort will improve 07/02/2017 0234 - Progressing by Theador Hawthorne, RN

## 2017-07-03 LAB — BASIC METABOLIC PANEL
Anion gap: 10 (ref 5–15)
BUN: 20 mg/dL (ref 6–20)
CO2: 27 mmol/L (ref 22–32)
Calcium: 10.1 mg/dL (ref 8.9–10.3)
Chloride: 101 mmol/L (ref 101–111)
Creatinine, Ser: 0.73 mg/dL (ref 0.61–1.24)
GFR calc Af Amer: >60 mL/min (ref >60)
GFR calc non Af Amer: >60 mL/min (ref >60)
Glucose, Bld: 100 mg/dL — ABNORMAL HIGH (ref 65–99)
Potassium: 4.6 mmol/L (ref 3.5–5.1)
Sodium: 138 mmol/L (ref 135–145)

## 2017-07-03 LAB — CBC
HCT: 37 % — ABNORMAL LOW (ref 39.0–52.0)
Hemoglobin: 11.5 g/dL — ABNORMAL LOW (ref 13.0–17.0)
MCH: 28 pg (ref 26.0–34.0)
MCHC: 31.1 g/dL (ref 30.0–36.0)
MCV: 90.2 fL (ref 78.0–100.0)
Platelets: 509 10*3/uL — ABNORMAL HIGH (ref 150–400)
RBC: 4.1 MIL/uL — ABNORMAL LOW (ref 4.22–5.81)
RDW: 16.8 % — ABNORMAL HIGH (ref 11.5–15.5)
WBC: 8.4 10*3/uL (ref 4.0–10.5)

## 2017-07-03 MED ORDER — ATROPINE SULFATE 1 % OP SOLN: 2.0000 [drp] | Freq: Three times a day (TID) | OPHTHALMIC | 0 refills | Status: DC | PRN

## 2017-07-03 MED ORDER — BACITRACIN-NEOMYCIN-POLYMYXIN OINTMENT TUBE: 1.0000 "application " | TOPICAL_OINTMENT | Freq: Two times a day (BID) | CUTANEOUS | 0 refills | Status: AC

## 2017-07-03 MED ORDER — ATROPINE SULFATE 1 % OP SOLN
2.0000 [drp] | Freq: Three times a day (TID) | OPHTHALMIC | Status: DC | PRN
  Filled 2017-07-03: qty 2

## 2017-07-03 MED ORDER — FREE WATER: 100.0000 mL | Freq: Every day | 0 refills | Status: DC

## 2017-07-03 MED ORDER — AMOXICILLIN-POT CLAVULANATE 400-57 MG/5ML PO SUSR: 800.0000 mg | Freq: Two times a day (BID) | ORAL | 0 refills | Status: AC

## 2017-07-03 MED ORDER — FAMOTIDINE 40 MG/5ML PO SUSR: 40.0000 mg | Freq: Every day | ORAL | 0 refills | Status: DC

## 2017-07-03 NOTE — Discharge Instructions (Signed)
How to Suction a Tracheostomy A tracheostomy, or trach, is a surgically created opening in the trachea. It is important to suction a trach from time to time. Doing this:  Removes mucus and other fluids that build up in the trachea.  Keeps the airway clear.  Makes it easier to breathe.  Two people may be needed to suction a trach. Supplies needed:  A suction catheter.  Clean gloves.  Sterile gloves.  A clean towel or paper drape.  A suction machine.  Connecting tubing.  Sterile container.  0.9% saline solution or sterile water. How to suction a trach When you suction a trach, make sure to follow any specific instructions that were given by the person's health care provider. 1. Have all supplies ready and available. 2. Wash your hands. 3. Put on clean gloves. 4. Attach one end of the connecting tubing to the suction machine. Place the other end next to the person who has a trach. 5. Turn on the suction machine. 6. Set the vacuum regulator to the appropriate negative pressure. 7. Have the person take deep breaths. 8. Prepare the suction catheter. While you do this, make sure the tube tip does not touch any non-sterile surface. 9. For a One-Time-Use Catheter: ? Open the catheter or kit. ? Lay a clean towel or paper drape across the person's chest. ? Unwrap or open the sterile container and put it on a nearby table. ? Pour the saline solution or sterile water in the container. ? Take off your gloves. ? Wash your hands. ? Put on sterile gloves. After you put these gloves on, do not touch any non-sterile surfaces. 10. For a Closed-Suction Catheter: ? Take off your gloves. ? Wash your hands. ? Put on sterile gloves. After you put these gloves on, do not touch any non-sterile surfaces. 11. Pick up the connecting tubing and the catheter, and attach the connecting tubing to the catheter tubing. 12. To check that all equipment is working as it should, try suctioning a small  amount of saline solution from the container. 13. Suction the trach. To do this: ? Give extra oxygen as needed. ? If the person is receiving mechanical ventilation, open the suction access (swivel adapter). If necessary, remove the oxygen or humidity delivery device. ? Without applying suction, gently and quickly insert the catheter into the trach using your thumb and forefinger. Try to do this at a time that you feel resistance or when the person coughs. As soon as you have inserted the catheter, pull it back  inch (1 cm). ? If the person is receiving mechanical ventilation, close the swivel adapter or replace the oxygen delivery device. ? Have the person take deep breaths. ? Rinse the catheter and the connecting tubing with saline solution or sterile water until it has been cleared. Use continuous suction. ? Repeat these steps one or two more times until the person no longer has noisy breathing. Pause for at least 1 minute before you repeat these steps. ? Suction secretions out of the mouth. 14. When suctioning is complete, disconnect the catheter from the connecting tubing. 15. Roll the catheter around your fingers. 16. Pull the glove off inside out so that catheter remains coiled in the glove. Pull off the other glove over the first glove in the same way. 17. Throw away your gloves. 18. Turn off the suction machine. 19. Remove the towel or paper drape. 20. Put on clean gloves. 21. Give extra oxygen as needed. 22. Throw away  any used supplies. 23. Remove your gloves. 24. Wash your hands.  This information is not intended to replace advice given to you by your health care provider. Make sure you discuss any questions you have with your health care provider. Document Released: 08/07/2006 Document Revised: 09/01/2015 Document Reviewed: 01/22/2015 Elsevier Interactive Patient Education  [employer].

## 2017-07-03 NOTE — Progress Notes (Signed)
Pt deep suctioned per MD request by this RT prior to D/C to SNF. Moderate amount of thick/frothy White/Yellow/Clear secretions removed. VS within normal limits, no distress noted. Pt stable throughout.

## 2017-07-03 NOTE — Progress Notes (Signed)
TCT patient's sister [name]; she has suction equipment at home; [facility] arranged with [facility]; patient is to be transported home via non emergent ambulance; [facility] called as requested, home address verified. Mindi Slicker [facility] [phone]

## 2017-07-03 NOTE — Progress Notes (Signed)
Pt discharge instructions reviewed with pt and his sister. Pt's sister verbalizes understanding. Pt nodded that he understood. [facility] is transporting pt home. Pt belongings with pt. Pt is not in distress.

## 2017-07-06 NOTE — Discharge Summary (Signed)
[facility] Discharge Summary   Patient: Scott Rivera OVF:[id number]   PCP: Jamesetta Orleans, PA-C DOB: 14-Jan-1951   Date of admission: 06/26/2017   Date of discharge: 07/03/2017   Discharge Diagnoses:  Principal Problem:   Pneumonia Active Problems:   Essential hypertension   History of completed stroke   Benign essential HTN   HCAP (healthcare-associated pneumonia)   Acute respiratory failure with hypoxia ([place])   Hyperkalemia   Hematuria   Admitted From: home Disposition:  Home with home health per family request  Recommendations for Outpatient Follow-up:  1. Follow-up with PCP in 1 week  Follow-up Information    Home, [facility] up.   Specialty:  [place] Why:  They will do your home health care at your home Contact information: [address] [place] [address] [phone]        Jamesetta Orleans, PA-C. Schedule an appointment as soon as possible for a visit on 07/10/2017.   Specialty:  Internal Medicine Why:  @2 :40pm Contact information: [place] [address] [phone]          Diet recommendation: tube feed diet  Activity: The patient is advised to gradually reintroduce usual activities.  Discharge Condition: fair  Code Status: full code  History of present illness: As per the H and P dictated on admission, "Scott Rivera is a 67 y.o. male with medical history significant for history of CVA with resulting aphasia, weakness, PEG dependence, and tracheostomy, now presenting to the emergency department for evaluation of increased secretions from his tracheostomy, hypoxia, gross hematuria, and drainage and swelling about his PEG site.  History is obtained from patient's family member and review of the EMR.  He has reportedly been experiencing gross hematuria for more than a week, as well as reported swelling and drainage from around his PEG tube.  He has also had increased secretions from his trach, described  as thick and colored.  He was evaluated in the emergency department on 06/20/2017, PEG tube appeared normal and CT of the abdomen and pelvis did not reveal any surrounding fluid collection or inflammation.  He was diagnosed with pneumonia during that ED visit and started on Levaquin.  He is on his last day of Levaquin, but respiratory condition has not improved, and may have worsened.  EMS found the patient be saturating in the mid 80s on his usual room air.  He was hospitalized in December for pneumonia and sputum culture grew multidrug-resistant Pseudomonas"  Hospital Course:  Summary of his active problems in the hospital is as following. 1.HCAP; acute hypoxic respiratory failure -Presents with multiple complaints, including increased trach secretions and hypoxia at home; he does not have baseline requirement for supplemental O2, noted to have sat in mid-80% with EMS -CXR findings suggest persistent PNA despite being on last day of Levaquin -He is afebrile on arrival; WBC has increased to 17,200 -Hospitalized with suspected pneumonia in December and sputum grew MDR pseudomonas; also has hx of MRSA carriage Blood cultures negative.  Clinically getting better.  Vancomycin and Merrem was initially started then transition to Unasyn.  Now on Augmentin Patient's primary issue is excessive secretions from his tracheostomy tube, requiring frequent suctioning.   DME suction arranged on discharge.  2.Hx of CVA -Patient has hx of CVA with residual deficits including aphasia and weakness -He is dependent on PEG and trach -No acute deficits -Continue ASA, Plavix, and statin  3.Hyperkalemia -Serum potassium is 5.6  on admission -Continue cardiac monitoring, hydrate with IVF, follow serial potassium levels  4.Hematuria -Patient reports gross hematuria -Most likely secondary to infection, sample sent for culture -Started on antibiotics as  above  5.Hypertension -BP at goal -Continue Lopressor  6.  PEG tube status. Patient's sister was concerned regarding PEG tube having infection although on clinical examination no leakage noted.  No redness or induration of the surrounding area as well.  No significant tenderness at the insertion site or in the surrounding area as well. There is some yellowish drainage although no area or pockets of fluctuation identified. Patient already on antibiotics. Use topical barrier cream and Neosporin. Past patient has history with his tubes getting clogged. Discontinue free water flush due to excessive secretion.  All other chronic medical condition were stable during the hospitalization.  Patient was seen by physical therapy, who recommended home health, which was arranged by Education officer, museum and case Freight forwarder. On the day of the discharge the patient's vitals were stable , and no other acute medical condition were reported by patient. the patient was felt safe to be discharge at home with home health.  Procedures and Results:  none   Consultations:  none  DISCHARGE MEDICATION: Allergies as of 07/03/2017   No Known Allergies     Medication List    STOP taking these medications   amLODipine 10 MG tablet Commonly known as:  NORVASC   famotidine 40 MG tablet Commonly known as:  PEPCID Replaced by:  famotidine 40 MG/5ML suspension   levofloxacin 500 MG tablet Commonly known as:  LEVAQUIN   levofloxacin 750 MG tablet Commonly known as:  LEVAQUIN   metoprolol tartrate 25 MG tablet Commonly known as:  LOPRESSOR     TAKE these medications   acetaminophen 500 MG tablet Commonly known as:  TYLENOL Place 1,000 mg into feeding tube as needed for moderate pain.   amoxicillin-clavulanate 400-57 MG/5ML suspension Commonly known as:  AUGMENTIN Place 10 mLs (800 mg total) into feeding tube 2 (two) times daily for 3 days.   aspirin 81 MG chewable tablet Place 1 tablet (81 mg  total) into feeding tube daily.   atropine 1 % ophthalmic solution Place 2 drops under the tongue every 8 (eight) hours as needed (excessive secretion).   cloNIDine 0.1 mg/24hr patch Commonly known as:  CATAPRES - Dosed in mg/24 hr Place 1 patch (0.1 mg total) onto the skin once a week.   clopidogrel 75 MG tablet Commonly known as:  PLAVIX Place 1 tablet (75 mg total) into feeding tube daily.   escitalopram 10 MG tablet Commonly known as:  LEXAPRO Place 10 mg into feeding tube daily.   famotidine 40 MG/5ML suspension Commonly known as:  PEPCID Place 5 mLs (40 mg total) into feeding tube daily. Replaces:  famotidine 40 MG tablet   feeding supplement (JEVITY 1.2 CAL) Liqd Place 1,000 mLs into feeding tube continuous. What changed:  additional instructions   free water Soln Place 100 mLs into feeding tube daily. What changed:  when to take this   gabapentin 100 MG capsule Commonly known as:  NEURONTIN Take 100 mg See admin instructions by mouth. Open 1 capsule (100 mg) and administer contents via feeding tube three times daily   ipratropium-albuterol 0.5-2.5 (3) MG/3ML Soln Commonly known as:  DUONEB Take 3 mLs by nebulization every 4 (four) hours as needed. What changed:  reasons to take this   neomycin-bacitracin-polymyxin Oint Commonly known as:  NEOSPORIN Apply 1 application topically 2 (two) times  daily for 7 days.   polyethylene glycol packet Commonly known as:  MIRALAX Take 17 g by mouth daily.   pravastatin 20 MG tablet Commonly known as:  PRAVACHOL Place 1 tablet (20 mg total) into feeding tube daily at 6 PM. What changed:  when to take this   thiamine 100 MG tablet Place 1 tablet (100 mg total) into feeding tube daily. What changed:  Another medication with the same name was removed. Continue taking this medication, and follow the directions you see here.      No Known Allergies Discharge Instructions    Increase activity slowly   Complete by:  As  directed      Discharge Exam: Filed Weights   07/01/17 0449 07/02/17 0523 07/03/17 0555  Weight: 66.3 kg (146 lb 2.6 oz) 65.8 kg (145 lb) 65.8 kg (145 lb)   Vitals:   07/03/17 1034 07/03/17 1158  BP: 138/60   Pulse: 69 75  Resp:  16  Temp:    SpO2: 97% 100%   General: Appear in no distress, no Rash; Oral Mucosa moist. Cardiovascular: S1 and S2 Present, no Murmur, no JVD Respiratory: Bilateral Air entry present and bilateral Crackles, no wheezes Abdomen: Bowel Sound present, Soft and no tenderness Extremities: no Pedal edema, no calf tenderness Neurology: Grossly no focal neuro deficit.  The results of significant diagnostics from this hospitalization (including imaging, microbiology, ancillary and laboratory) are listed below for reference.    Significant Diagnostic Studies: Dg Chest 2 View  Result Date: 06/20/2017 CLINICAL DATA:  Pearline Cables phlegm from G-tube. Aspiration pneumonia, infarct, hypertension, former smoker. EXAM: CHEST - 2 VIEW COMPARISON:  04/03/2017 FINDINGS: Heart is top-normal in size. There is mild aortic atherosclerosis at the arch. Redemonstration of bibasilar airspace opacities likely representing areas of atelectasis. Small foci of pneumonia are not excluded especially at the right lung base given its slightly more confluent appearance. Trace left effusion blunting the left costophrenic angle. Two surgical clips project over the left lower chest wall and 3 overlying the left lung apex. Tracheostomy tube tip terminates in the superior mediastinum centered over the trachea. No acute osseous abnormality. Right coracoclavicular ligament ossification is seen. IMPRESSION: 1. Redemonstration of bibasilar airspace opacities, more likely representing atelectasis. However given the slightly more confluent appearance at the right lung base medially, findings could potentially represent stigmata of aspiration pneumonia. 2. Minimal aortic atherosclerosis. Electronically Signed   By:  Ashley Royalty M.D.   On: 06/20/2017 19:04   Ct Abdomen Pelvis W Contrast  Result Date: 06/20/2017 CLINICAL DATA:  67 y/o M; history of stroke with Foley, G-tube, and trach. Patient pole Foley and there is drainage around the G-tube. EXAM: CT ABDOMEN AND PELVIS WITH CONTRAST TECHNIQUE: Multidetector CT imaging of the abdomen and pelvis was performed using the standard protocol following bolus administration of intravenous contrast. CONTRAST:  166mL ISOVUE-300 IOPAMIDOL (ISOVUE-300) INJECTION 61% COMPARISON:  10/26/2016 CT abdomen and pelvis. FINDINGS: Lower chest: Small left pleural effusion. Coronary artery calcifications. Hepatobiliary: Subcentimeter lucency in liver segment 4B, probably cysts. Granuloma in left lobe of liver. Normal gallbladder. No biliary ductal dilatation. Pancreas: Unremarkable. No pancreatic ductal dilatation or surrounding inflammatory changes. Spleen: Normal in size without focal abnormality. Adrenals/Urinary Tract: Atrophic left kidney. Normal adrenal glands. No focal right kidney lesion. Mild motion artifact through the right kidney. Mild right hydronephrosis, no obstructing stone or mass identified. Ureter enlargement may be due to large volume of stool in the rectum with mass effect and pelvis. Foley catheter balloon within  a decompressed bladder. Stomach/Bowel: Gastrostomy tube within gastric body. No fluid collection within the anterior abdominal wall along the course of gastrostomy tube. Large volume of stool throughout colon with significant stool in rectum. No obstructive or inflammatory changes of the bowel. Vascular/Lymphatic: Aortic atherosclerosis. No enlarged abdominal or pelvic lymph nodes. Reproductive: Prostate is unremarkable. Other: Midline postsurgical changes in the anterior abdominal wall are unchanged. No ascites. Musculoskeletal: Mild S-shaped curvature of the lumbar spine and multilevel disc greater than facet degenerative changes. Severe degenerative changes of the  right-greater-than-left hip joints. 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 No acute fracture identified. IMPRESSION: 1. Gastrostomy balloon within gastric body. No fluid collection in anterior abdominal wall along course of gastrostomy tube. 2. Foley catheter balloon within the bladder. 3. Small left pleural effusion. 4. Large volume of stool throughout colon with significant stool in rectum may represent constipation. 5. Mild right hydronephrosis, no obstructing stone identified. Findings may be due to mass effect from large rectal stool burden. 6. Aortic atherosclerosis. Electronically Signed   By: Kristine Garbe M.D.   On: 06/20/2017 18:44   Dg Chest Portable 1 View  Result Date: 06/26/2017 CLINICAL DATA:  Blood, pus and swelling about the patient's gastric tube. Blood tinged urine and increased secretions in tracheostomy. History of aspiration pneumonia, hypertension and infarct. Patient is afebrile. EXAM: PORTABLE CHEST 1 VIEW COMPARISON:  06/20/2017 FINDINGS: Stable cardiomegaly with aortic atherosclerosis. Patchy airspace opacities persist at the lung bases, right greater than left, slightly improved at the left lung base and unchanged at the right lung base. No overt pulmonary edema. No effusion. Tracheostomy tube tip is centered over the upper third of the trachea in satisfactory position. Surgical clips project over the left lung apex and left lung base. 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 Corticoclavicular ligament ossification on the right. IMPRESSION: 1. Cardiomegaly with bibasilar airspace opacities, slightly improved at the left lung base and persistent and unchanged in appearance at the right lung base. Findings suggest partial clearing of left basilar atelectasis and/or pneumonia with unchanged right basilar atelectasis and pneumonia. 2. No overt pulmonary edema. 3. Aortic atherosclerosis. 4. 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 Electronically Signed   By: Ashley Royalty M.D.   On: 06/26/2017 21:52    Microbiology: Recent Results (from the past 240 hour(s))  Blood culture (routine x 2)     Status: None   Collection Time: 06/26/17  9:50 PM  Result Value Ref Range Status   Specimen Description BLOOD RIGHT FOREARM  Final   Special Requests   Final    BOTTLES DRAWN AEROBIC AND ANAEROBIC Blood Culture adequate volume   Culture   Final    NO GROWTH 5 DAYS Performed at [facility], [address], [place], [place] [address]    Report Status 07/01/2017 FINAL  Final  Blood culture (routine x 2)     Status: None   Collection Time: 06/26/17  9:55 PM  Result Value Ref Range Status   Specimen Description BLOOD LEFT HAND  Final   Special Requests   Final    BOTTLES DRAWN AEROBIC AND ANAEROBIC Blood Culture adequate volume   Culture   Final    NO GROWTH 5 DAYS Performed at [facility], [place] [address] [place], [place] [address]    Report Status 07/01/2017 FINAL  Final  Urine culture     Status: Abnormal   Collection Time: 06/26/17 11:28 PM  Result  Value Ref Range Status   Specimen Description URINE, CLEAN CATCH  Final   Special Requests   Final    NONE Performed at [facility], [place] [address] [place], [place] [address]    Culture 50,000 COLONIES/mL YEAST (A)  Final   Report Status 06/28/2017 FINAL  Final  Culture, respiratory (NON-Expectorated)     Status: None   Collection Time: 06/27/17 11:23 AM  Result Value Ref Range Status   Specimen Description TRACHEAL ASPIRATE  Final   Special Requests NONE  Final   Gram Stain   Final    RARE WBC PRESENT, PREDOMINANTLY PMN FEW GRAM POSITIVE RODS RARE GRAM POSITIVE COCCI    Culture   Final    MODERATE CORYNEBACTERIUM STRIATUM Standardized susceptibility testing for this organism is not available. Performed at [facility], [place] [address] [place], [place] [address]    Report Status 06/29/2017 FINAL  Final  MRSA PCR  Screening     Status: None   Collection Time: 06/27/17  3:47 PM  Result Value Ref Range Status   MRSA by PCR NEGATIVE NEGATIVE Final    Comment:        The GeneXpert MRSA Assay (FDA approved for NASAL specimens only), is one component of a comprehensive MRSA colonization surveillance program. It is not intended to diagnose MRSA infection nor to guide or monitor treatment for MRSA infections. Performed at [facility], [place] [address], [place], [place] [address]   Respiratory Panel by PCR     Status: None   Collection Time: 06/28/17 10:15 PM  Result Value Ref Range Status   Adenovirus NOT DETECTED NOT DETECTED Final   Coronavirus 229E NOT DETECTED NOT DETECTED Final   Coronavirus HKU1 NOT DETECTED NOT DETECTED Final   Coronavirus NL63 NOT DETECTED NOT DETECTED Final   Coronavirus OC43 NOT DETECTED NOT DETECTED Final   Metapneumovirus NOT DETECTED NOT DETECTED Final   Rhinovirus / Enterovirus NOT DETECTED NOT DETECTED Final   Influenza A NOT DETECTED NOT DETECTED Final   Influenza B NOT DETECTED NOT DETECTED Final   Parainfluenza Virus 1 NOT DETECTED NOT DETECTED Final   Parainfluenza Virus 2 NOT DETECTED NOT DETECTED Final   Parainfluenza Virus 3 NOT DETECTED NOT DETECTED Final   Parainfluenza Virus 4 NOT DETECTED NOT DETECTED Final   Respiratory Syncytial Virus NOT DETECTED NOT DETECTED Final   Bordetella pertussis NOT DETECTED NOT DETECTED Final   Chlamydophila pneumoniae NOT DETECTED NOT DETECTED Final   Mycoplasma pneumoniae NOT DETECTED NOT DETECTED Final    Comment: Performed at [facility], [place] [address], [place], [place] [address]     Labs: CBC: Recent Labs  Lab 06/30/17 1411 07/01/17 0655 07/02/17 0341 07/03/17 0817  WBC 11.2* 11.0* 12.3* 8.4  HGB 10.9* 11.3* 11.8* 11.5*  HCT 34.8* 37.9* 37.7* 37.0*  MCV 90.4 91.3 90.4 90.2  PLT 511* 477* 515* 295*   Basic Metabolic Panel: Recent Labs  Lab 06/30/17 1411 07/02/17 0341 07/03/17 0817  NA  139 137 138  K 4.6 4.2 4.6  CL 102 100* 101  CO2 28 29 27   GLUCOSE 111* 112* 100*  BUN 17 17 20   CREATININE 0.77 0.66 0.73  CALCIUM 10.1 10.1 10.1   Liver Function Tests: No results for input(s): AST, ALT, ALKPHOS, BILITOT, PROT, ALBUMIN in the last 168 hours. No results for input(s): LIPASE, AMYLASE in the last 168 hours. No results for input(s): AMMONIA in the last 168 hours. Cardiac Enzymes:  No results for input(s): CKTOTAL, CKMB, CKMBINDEX, TROPONINI in the last 168 hours. BNP (last 3 results) Recent Labs    03/02/17 1643 03/30/17 1302 06/29/17 0737  BNP 11.8 18.0 10.5   CBG: Recent Labs  Lab 06/30/17 0535 07/01/17 0744 07/02/17 0544  GLUCAP 116* 92 91   Time spent: 35 minutes  Signed:  Berle Mull  [facility] 07/03/2017 , 5:33 PM

## 2017-07-16 ENCOUNTER — Other Ambulatory Visit: Payer: Self-pay

## 2017-07-16 ENCOUNTER — Emergency Department (HOSPITAL_COMMUNITY)
Admission: EM | Admit: 2017-07-16 | Discharge: 2017-07-16 | Disposition: A | Discharge status: Home / Self Care | Payer: Medicare HMO | Attending: Emergency Medicine | Admitting: Emergency Medicine

## 2017-07-16 ENCOUNTER — Encounter (HOSPITAL_COMMUNITY): Payer: Self-pay

## 2017-07-16 ENCOUNTER — Emergency Department (HOSPITAL_COMMUNITY): Payer: Medicare HMO

## 2017-07-16 DIAGNOSIS — Y802 Prosthetic and other implants, materials and accessory physical medicine devices associated with adverse incidents: Secondary | ICD-10-CM | POA: Diagnosis not present

## 2017-07-16 DIAGNOSIS — R05 Cough: Secondary | ICD-10-CM | POA: Insufficient documentation

## 2017-07-16 DIAGNOSIS — Z7902 Long term (current) use of antithrombotics/antiplatelets: Secondary | ICD-10-CM | POA: Insufficient documentation

## 2017-07-16 DIAGNOSIS — I129 Hypertensive chronic kidney disease with stage 1 through stage 4 chronic kidney disease, or unspecified chronic kidney disease: Secondary | ICD-10-CM | POA: Insufficient documentation

## 2017-07-16 DIAGNOSIS — T85598A Other mechanical complication of other gastrointestinal prosthetic devices, implants and grafts, initial encounter: Secondary | ICD-10-CM | POA: Diagnosis not present

## 2017-07-16 DIAGNOSIS — Z7982 Long term (current) use of aspirin: Secondary | ICD-10-CM | POA: Insufficient documentation

## 2017-07-16 DIAGNOSIS — Z79899 Other long term (current) drug therapy: Secondary | ICD-10-CM | POA: Insufficient documentation

## 2017-07-16 DIAGNOSIS — R059 Cough, unspecified: Secondary | ICD-10-CM

## 2017-07-16 DIAGNOSIS — N183 Chronic kidney disease, stage 3 (moderate): Secondary | ICD-10-CM | POA: Diagnosis not present

## 2017-07-16 DIAGNOSIS — Z87891 Personal history of nicotine dependence: Secondary | ICD-10-CM | POA: Insufficient documentation

## 2017-07-16 DIAGNOSIS — R5383 Other fatigue: Secondary | ICD-10-CM | POA: Diagnosis present

## 2017-07-16 DIAGNOSIS — Z930 Tracheostomy status: Secondary | ICD-10-CM | POA: Diagnosis not present

## 2017-07-16 LAB — URINALYSIS, ROUTINE W REFLEX MICROSCOPIC
Bilirubin Urine: NEGATIVE
Glucose, UA: NEGATIVE mg/dL
Ketones, ur: NEGATIVE mg/dL
Nitrite: NEGATIVE
Protein, ur: 30 mg/dL — AB
Specific Gravity, Urine: 1.017 (ref 1.005–1.030)
Squamous Epithelial / LPF: NONE SEEN
pH: 6 (ref 5.0–8.0)

## 2017-07-16 LAB — COMPREHENSIVE METABOLIC PANEL
ALT: 20 U/L (ref 17–63)
AST: 14 U/L — ABNORMAL LOW (ref 15–41)
Albumin: 2.9 g/dL — ABNORMAL LOW (ref 3.5–5.0)
Alkaline Phosphatase: 100 U/L (ref 38–126)
Anion gap: 9 (ref 5–15)
BUN: 27 mg/dL — ABNORMAL HIGH (ref 6–20)
CO2: 28 mmol/L (ref 22–32)
Calcium: 9.7 mg/dL (ref 8.9–10.3)
Chloride: 102 mmol/L (ref 101–111)
Creatinine, Ser: 0.93 mg/dL (ref 0.61–1.24)
GFR calc Af Amer: >60 mL/min (ref >60)
GFR calc non Af Amer: >60 mL/min (ref >60)
Glucose, Bld: 79 mg/dL (ref 65–99)
Potassium: 4.3 mmol/L (ref 3.5–5.1)
Sodium: 139 mmol/L (ref 135–145)
Total Bilirubin: 0.7 mg/dL (ref 0.3–1.2)
Total Protein: 8 g/dL (ref 6.5–8.1)

## 2017-07-16 LAB — CBC WITH DIFFERENTIAL/PLATELET
Basophils Absolute: 0 10*3/uL (ref 0.0–0.1)
Basophils Relative: 0 %
Eosinophils Absolute: 0.9 10*3/uL — ABNORMAL HIGH (ref 0.0–0.7)
Eosinophils Relative: 7 %
HCT: 38.5 % — ABNORMAL LOW (ref 39.0–52.0)
Hemoglobin: 11.8 g/dL — ABNORMAL LOW (ref 13.0–17.0)
Lymphocytes Relative: 17 %
Lymphs Abs: 2.2 10*3/uL (ref 0.7–4.0)
MCH: 28.2 pg (ref 26.0–34.0)
MCHC: 30.6 g/dL (ref 30.0–36.0)
MCV: 91.9 fL (ref 78.0–100.0)
Monocytes Absolute: 1.2 10*3/uL — ABNORMAL HIGH (ref 0.1–1.0)
Monocytes Relative: 9 %
Neutro Abs: 8.4 10*3/uL — ABNORMAL HIGH (ref 1.7–7.7)
Neutrophils Relative %: 67 %
Platelets: 491 10*3/uL — ABNORMAL HIGH (ref 150–400)
RBC: 4.19 MIL/uL — ABNORMAL LOW (ref 4.22–5.81)
RDW: 17 % — ABNORMAL HIGH (ref 11.5–15.5)
WBC: 12.7 10*3/uL — ABNORMAL HIGH (ref 4.0–10.5)

## 2017-07-16 MED ORDER — SODIUM CHLORIDE 0.9 % IV BOLUS: 500.0000 mL | Freq: Once | INTRAVENOUS | Status: DC

## 2017-07-16 NOTE — ED Notes (Signed)
Unsuccessful IV attempt by this rn. Phlebotomy present to draw bloodwork and pt refused to have blood work drawn.

## 2017-07-16 NOTE — ED Provider Notes (Signed)
[facility] EMERGENCY DEPARTMENT Provider Note   CSN: [id number] Arrival date & time: 07/16/17  1803     History   Chief Complaint Chief Complaint  Patient presents with  . Fatigue    HPI Scott Rivera is a 67 y.o. male.  Patient presents for evaluation of congestion with increased output from tracheostomy, and concern for infection around his G-tube.  He has a nurse 10 hours a day, and he is here with both his nurse and his sister who gives history.  Reportedly his feeding tube was placed about a year ago and is not been changed since then.  He receives all medication and nutrition through this tube.  There has been no fever, chills, cough, change in mental status or other known illnesses.  He is bedbound secondary to a stroke, and is able to communicate with simple words and phrases with a talking device, and is alert.  He gets continuous G-tube feeds, and his nurse states that sometimes the tube "clogs up" and she is unable to change her clean it.  Patient's sister is concerned that the tube was not replaced when he was hospitalized recently and had a infection around the tube.  She is interested in having the tube change, before he leaves.  Patient is unable to contribute to history because he cannot verbalize at this time, because he does not have his talking device with him.  There are no other known modifying factors.  HPI  Past Medical History:  Diagnosis Date  . Anemia due to chronic kidney disease   . Aspiration pneumonia ([place])   . Chronic kidney disease, stage 3 (moderate) (HCC)   . Hypertension   . Stroke [facility])     Patient Active Problem List   Diagnosis Date Noted  . Hyperkalemia 06/26/2017  . Hematuria 06/26/2017  . Bronchitis/??? Pneumonia 03/30/2017  . S/P percutaneous endoscopic gastrostomy (PEG) tube placement ([employer]) 03/30/2017  . Chronic anemia 03/30/2017  . Foley catheter in place on admission 03/30/2017  . UTI (urinary tract infection)  03/30/2017  . MDR Acinetobacter baumannii infection   . Gastrostomy tube obstruction ([place])   . PEG tube malfunction ([place])   . Acute respiratory failure with hypoxia ([place]) 03/02/2017  . Loculated pleural effusion   . Acute respiratory distress   . Fever   . Follow up   . HCAP (healthcare-associated pneumonia)   . Stage III pressure ulcer of sacral region ([place]) 12/17/2016  . Hypoxemia   . H/O tracheostomy   . Pneumothorax   . Pneumothorax, closed, traumatic, initial encounter 12/10/2016  . Sepsis ([place]) 12/09/2016  . Hypotension 12/09/2016  . Acute-on-chronic kidney injury ([place]) 12/09/2016  . Elevated troponin 12/09/2016  . Anemia due to chronic kidney disease 12/09/2016  . Compensated metabolic alkalosis 40/98/1191  . Pneumonia   . Goals of care, counseling/discussion   . Palliative care by specialist   . Respiratory failure ([place])   . Aspiration into airway   . Septic shock ([place])   . Dysphagia, post-stroke   . Benign essential HTN   . Tobacco abuse   . Tachypnea   . Hyperglycemia   . Hypernatremia   . Leukocytosis (leucocytosis)   . Acute blood loss anemia   . Chronic kidney disease   . Pressure injury of skin 10/20/2016  . Cerebellar infarct ([place]) 10/19/2016  . ARF (acute renal failure) ([place]) 10/19/2016  . Essential hypertension 10/19/2016  . Rhabdomyolysis 10/19/2016  . History of completed stroke 10/19/2016  .  Bilateral chronic knee pain 01/07/2016  . History of adenomatous polyp of colon 07/28/2015  . Hyperlipidemia 07/28/2013  . Helicobacter pylori infection 09/21/2012    Past Surgical History:  Procedure Laterality Date  . IR GASTROSTOMY TUBE MOD SED  10/27/2016  . IR PATIENT EVAL TECH 0-60 MINS  01/05/2017  . IR PATIENT EVAL TECH 0-60 MINS  01/09/2017  . IR PATIENT EVAL TECH 0-60 MINS  03/03/2017  . IR PATIENT EVAL TECH 0-60 MINS  04/03/2017  . KNEE SURGERY    . TRACHEOSTOMY TUBE PLACEMENT          Home Medications    Prior to Admission medications     Medication Sig Start Date End Date Taking? Authorizing Provider  acetaminophen (TYLENOL) 500 MG tablet Place 1,000 mg into feeding tube 2 (two) times daily.    Yes [provider]  aspirin 81 MG chewable tablet Place 1 tablet (81 mg total) into feeding tube daily. 04/05/17  Yes Nita Sells, MD  atropine 1 % ophthalmic solution Place 2 drops under the tongue every 8 (eight) hours as needed (excessive secretion). 07/03/17  Yes Lavina Hamman, MD  cloNIDine (CATAPRES - DOSED IN MG/24 HR) 0.1 mg/24hr patch Place 1 patch (0.1 mg total) onto the skin once a week. Patient taking differently: Place 0.1 mg onto the skin every Tuesday.  04/10/17  Yes Nita Sells, MD  clopidogrel (PLAVIX) 75 MG tablet Place 1 tablet (75 mg total) into feeding tube daily. 01/11/17  Yes Cherene Altes, MD  dextromethorphan-guaiFENesin [facility] DM) 30-600 MG 12hr tablet 1 tablet 2 (two) times daily.   Yes [provider]  gabapentin (NEURONTIN) 100 MG capsule Take 100 mg See admin instructions by mouth. Open 1 capsule (100 mg) and administer contents via feeding tube three times daily   Yes [provider]  ipratropium-albuterol (DUONEB) 0.5-2.5 (3) MG/3ML SOLN Take 3 mLs by nebulization every 4 (four) hours as needed. Patient taking differently: Take 3 mLs by nebulization every 4 (four) hours as needed (shortness of breath/wheezing).  01/10/17  Yes Cherene Altes, MD  Neomycin-Bacitracin-Polymyxin (TRIPLE ANTIBIOTIC) [phone] OINT Apply 1 application topically 2 (two) times daily.    Yes [provider]  Nutritional Supplements (FEEDING SUPPLEMENT, JEVITY 1.2 CAL,) LIQD Place 1,000 mLs into feeding tube continuous. Patient taking differently: Place 1,000 mLs continuous into feeding tube. 60 ml/hr 01/10/17  Yes Cherene Altes, MD  pravastatin (PRAVACHOL) 20 MG tablet Place 1 tablet (20 mg total) into feeding tube daily at 6 PM. Patient taking differently: Place 20  mg daily into feeding tube.  01/10/17  Yes Cherene Altes, MD  thiamine 100 MG tablet Place 1 tablet (100 mg total) into feeding tube daily. 01/11/17  Yes Cherene Altes, MD  Water For Irrigation, Sterile (FREE WATER) SOLN Place 100 mLs into feeding tube daily. Patient taking differently: Place 100 mLs into feeding tube every 3 (three) hours.  07/03/17  Yes Lavina Hamman, MD  amoxicillin-clavulanate (AUGMENTIN) 400-57 MG/5ML suspension Place 5 mLs into feeding tube 2 (two) times daily.    [provider]  famotidine (PEPCID) 40 MG/5ML suspension Place 5 mLs (40 mg total) into feeding tube daily. Patient not taking: Reported on 07/16/2017 07/03/17   Lavina Hamman, MD  polyethylene glycol [facility]) packet Take 17 g by mouth daily. Patient not taking: Reported on 06/26/2017 06/20/17   Glyn Ade, PA-C    Family History Family History  Problem Relation Age of Onset  .  Diabetes Mellitus II Brother   . CAD Neg Hx   . Stroke Neg Hx     Social History Social History   Tobacco Use  . Smoking status: Former Research scientist (life sciences)  . Smokeless tobacco: Never Used  . Tobacco comment: Smoked heavily until the day of his stroke  Substance Use Topics  . Alcohol use: Yes    Comment: Drank heavily prior to stroke per sister  . Drug use: Not on file     Allergies   Patient has no known allergies.   Review of Systems Review of Systems  Unable to perform ROS: Other     Physical Exam Updated Vital Signs BP 140/70   Pulse 81   Temp 97.9 F (36.6 C) (Rectal)   Resp 17   Ht 5\' 6"  (1.676 m)   Wt 65.8 kg (145 lb)   SpO2 (!) 87%   BMI 23.40 kg/m   Physical Exam  Constitutional: He appears well-developed. No distress.  Appears older than stated age.  He is debilitated.  HENT:  Head: Normocephalic and atraumatic.  Right Ear: External ear normal.  Left Ear: External ear normal.  Eyes: Pupils are equal, round, and reactive to light. Conjunctivae and EOM are normal.  Neck: Normal  range of motion and phonation normal. Neck supple.  Cardiovascular: Normal rate, regular rhythm and normal heart sounds.  Pulmonary/Chest: Effort normal and breath sounds normal. He exhibits no bony tenderness.  Abdominal: Soft. He exhibits no distension. There is no tenderness.  Enteral tube left upper quadrant, site remarkable for small amount of drainage around the tube, but no associated fluctuance, bleeding or induration.  The feeding tube and connector appears somewhat aged, but appears intact and functional.  Musculoskeletal:  Atrophic extremities arms and legs.  Moderate swelling left knee greater than right knee.  Neurological: He is alert. No cranial nerve deficit.  Quadriparetic, alert and follows commands  Skin: Skin is warm, dry and intact.  Psychiatric: He has a normal mood and affect. His behavior is normal.  Nursing note and vitals reviewed.    ED Treatments / Results  Labs (all labs ordered are listed, but only abnormal results are displayed) Labs Reviewed  COMPREHENSIVE METABOLIC PANEL - Abnormal; Notable for the following components:      Result Value   BUN 27 (*)    Albumin 2.9 (*)    AST 14 (*)    All other components within normal limits  CBC WITH DIFFERENTIAL/PLATELET - Abnormal; Notable for the following components:   WBC 12.7 (*)    RBC 4.19 (*)    Hemoglobin 11.8 (*)    HCT 38.5 (*)    RDW 17.0 (*)    Platelets 491 (*)    Neutro Abs 8.4 (*)    Monocytes Absolute 1.2 (*)    Eosinophils Absolute 0.9 (*)    All other components within normal limits  URINALYSIS, ROUTINE W REFLEX MICROSCOPIC - Abnormal; Notable for the following components:   APPearance HAZY (*)    Hgb urine dipstick LARGE (*)    Protein, ur 30 (*)    Leukocytes, UA LARGE (*)    Bacteria, UA RARE (*)    All other components within normal limits    EKG None  Radiology Dg Chest Port 1 View  Result Date: 07/16/2017 CLINICAL DATA:  Lethargy.  History of aspiration pneumonia. EXAM:  PORTABLE CHEST 1 VIEW COMPARISON:  Chest x-ray dated June 26, 2017. FINDINGS: Unchanged tracheostomy tube. Stable cardiomegaly. Normal pulmonary vascularity. Patchy  airspace disease at both lung bases appears slightly improved. No focal consolidation, pleural effusion, or pneumothorax. 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 IMPRESSION: 1. Improved patchy airspace disease at the lung bases. 2. 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 Electronically Signed   By: Titus Dubin M.D.   On: 07/16/2017 20:43    Procedures Procedures (including critical care time)  Medications Ordered in ED Medications  sodium chloride 0.9 % bolus 500 mL (has no administration in time range)     Initial Impression / Assessment and Plan / ED Course  I have reviewed the triage vital signs and the nursing notes.  Pertinent labs & imaging results that were available during my care of the patient were reviewed by me and considered in my medical decision making (see chart for details).  Clinical Course as of Jul 16 2257  Mon Jul 16, 2017  2122 Review of discharge summary from hospitalization 3/6 until 06/26/2017, indicated that he had a very similar presentation on 3/619 as to the one today.  Additionally, the enteral tube site, and the complaints, are essentially the same.  The determination was that the tube was functional and that there was no significant ostomy tract infection, and that he was on antibiotics, so no intervention or tube change was indicated.   [EW]  2146 Abnormal but could represent chronic changes with urine obtained from Foley catheter bag.  No overt evidence for infection.  Urinalysis, Routine w reflex microscopic(!) [EW]  2253 Patient has been transitioned to room air oxygen, and saturations remain in the range of 98-99.  He continues to have tracheal secretions that he coughs out.   [EW]    Clinical Course User Index [EW] Daleen Bo, MD     Patient Vitals  for the past 24 hrs:  BP Temp Temp src Pulse Resp SpO2 Height Weight  07/16/17 2224 - 97.9 F (36.6 C) Rectal - - - - -  07/16/17 2200 140/70 - - 81 17 (!) 87 % - -  07/16/17 2100 127/77 - - 78 (!) 23 (!) 87 % - -  07/16/17 2000 (!) 159/64 - - - 15 - - -  07/16/17 1930 138/85 - - 74 16 95 % - -  07/16/17 1923 - - - 82 17 92 % - -  07/16/17 1900 134/69 - - 77 18 92 % - -  07/16/17 1830 133/69 - - 75 16 - - -  07/16/17 1827 - - - - - - 5\' 6"  (1.676 m) 65.8 kg (145 lb)  07/16/17 1819 133/69 98.3 F (36.8 C) Axillary 75 20 95 % - -    10:53 PM Reevaluation with update and discussion. After initial assessment and treatment, an updated evaluation reveals patient remains comfortable and expresses no concerns or problems.  Findings discussed with the patient and his sister who is his primary caregiver.  All questions answered. [place] decision making-debilitated patient, maintained at home with daily nursing, as well as family care.  Caregivers have been concerned about his G-tube, for 1 month it is essentially unchanged, during this time.  Oxygen saturation is normal, when checked on earlobe, it had been running low when checked at fingers.  Patient has contraction deformities of both hands, and likely is not fully perfusing his fingers because of that.  It is important to check his oxygen saturation on a site such as his earlobe, to more accurately assess the true oxygenation.  Patient's sister  who is his primary caregiver, understands this, and is in agreement with the plan for discharge with outpatient management.  She plans on calling his PCP to arrange for change of the enteral tube.  Nursing Notes Reviewed/ Care Coordinated Applicable Imaging Reviewed Interpretation of Laboratory Data incorporated into ED treatment  The patient appears reasonably screened and/or stabilized for discharge and I doubt any other medical condition or other [facility] requiring further screening,  evaluation, or treatment in the ED at this time prior to discharge.  Plan: Home Medications-continue current medications; Home Treatments-continue usual home care; return here if the recommended treatment, does not improve the symptoms; Recommended follow up-PCP as needed.  Contact PCP to arrange for outpatient exchange of enteral tube.      Final Clinical Impressions(s) / ED Diagnoses   Final diagnoses:  Cough  Feeding tube dysfunction, initial encounter    ED Discharge Orders    None       Daleen Bo, MD 07/16/17 2300

## 2017-07-16 NOTE — ED Notes (Signed)
Pt desatting to 80%, pt suctioned x2. MD aware.

## 2017-07-16 NOTE — ED Notes (Signed)
[facility] notified of pt's need for transport to home

## 2017-07-16 NOTE — ED Notes (Signed)
Pt discharged from ED; instructions provided; Pt encouraged to return to ED if symptoms worsen and to f/u with PCP; Pt verbalized understanding of all instructions 
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
## 2017-07-16 NOTE — ED Notes (Signed)
Pt suctioned x4 by respiratory. Clear yellow-tinged mucous produced. Per home health, pt receives continuous tube feeds at home. G-tube dark and oozing around site; has not been changed in 10 months.

## 2017-07-16 NOTE — Discharge Instructions (Addendum)
Contact his primary care doctor to report the findings and recommendations from this evening's visit.  We recommend that his feeding tube be replaced by interventional radiology, via a scheduled appointment as an outpatient.  Continue tracheal suctioning and usual medications for his ongoing care.  When checking his oxygen saturation try to measure it on his earlobe which is more accurate since he has contractures of both hands.  Return here, if needed, for problems.

## 2017-07-18 ENCOUNTER — Telehealth (HOSPITAL_COMMUNITY): Payer: Self-pay

## 2017-07-18 ENCOUNTER — Other Ambulatory Visit (HOSPITAL_COMMUNITY): Payer: Self-pay | Admitting: Internal Medicine

## 2017-07-18 DIAGNOSIS — K9423 Gastrostomy malfunction: Secondary | ICD-10-CM

## 2017-07-18 NOTE — Telephone Encounter (Signed)
Called to schedule peg replacement, no answer, no vm. AW

## 2017-07-23 ENCOUNTER — Ambulatory Visit (HOSPITAL_COMMUNITY)
Admission: RE | Admit: 2017-07-23 | Discharge: 2017-07-23 | Disposition: A | Discharge status: Home / Self Care | Payer: Medicare HMO | Source: Ambulatory Visit | Attending: Internal Medicine | Admitting: Internal Medicine

## 2017-07-23 ENCOUNTER — Encounter (HOSPITAL_COMMUNITY): Payer: Self-pay | Admitting: Interventional Radiology

## 2017-07-23 DIAGNOSIS — K9423 Gastrostomy malfunction: Secondary | ICD-10-CM | POA: Diagnosis present

## 2017-07-23 HISTORY — PX: IR REPLC GASTRO/COLONIC TUBE PERCUT W/FLUORO: IMG2333

## 2017-07-23 MED ORDER — LIDOCAINE VISCOUS 2 % MT SOLN
OROMUCOSAL | Status: AC
  Filled 2017-07-23: qty 15

## 2017-07-23 MED ORDER — IOPAMIDOL (ISOVUE-300) INJECTION 61%
INTRAVENOUS | Status: AC
  Administered 2017-07-23: 10 mL
  Filled 2017-07-23: qty 50

## 2017-07-24 DIAGNOSIS — R0689 Other abnormalities of breathing: Secondary | ICD-10-CM | POA: Diagnosis present

## 2017-07-26 ENCOUNTER — Other Ambulatory Visit: Payer: Self-pay

## 2017-07-26 ENCOUNTER — Emergency Department (HOSPITAL_BASED_OUTPATIENT_CLINIC_OR_DEPARTMENT_OTHER)
Admission: EM | Admit: 2017-07-26 | Discharge: 2017-07-26 | Disposition: A | Discharge status: Home / Self Care | Payer: Medicare HMO | Attending: Emergency Medicine | Admitting: Emergency Medicine

## 2017-07-26 ENCOUNTER — Encounter (HOSPITAL_BASED_OUTPATIENT_CLINIC_OR_DEPARTMENT_OTHER): Payer: Self-pay | Admitting: *Deleted

## 2017-07-26 DIAGNOSIS — Z430 Encounter for attention to tracheostomy: Secondary | ICD-10-CM | POA: Insufficient documentation

## 2017-07-26 DIAGNOSIS — Z79899 Other long term (current) drug therapy: Secondary | ICD-10-CM | POA: Insufficient documentation

## 2017-07-26 DIAGNOSIS — I129 Hypertensive chronic kidney disease with stage 1 through stage 4 chronic kidney disease, or unspecified chronic kidney disease: Secondary | ICD-10-CM | POA: Insufficient documentation

## 2017-07-26 DIAGNOSIS — N183 Chronic kidney disease, stage 3 (moderate): Secondary | ICD-10-CM | POA: Insufficient documentation

## 2017-07-26 DIAGNOSIS — Z87891 Personal history of nicotine dependence: Secondary | ICD-10-CM | POA: Diagnosis not present

## 2017-07-26 DIAGNOSIS — Z8673 Personal history of transient ischemic attack (TIA), and cerebral infarction without residual deficits: Secondary | ICD-10-CM | POA: Diagnosis not present

## 2017-07-26 NOTE — ED Notes (Signed)
#  4 CFS [place] placed by EDP. Copious amount of thick blood tinge secretions. No complications. No complications noted. Color change with ETCO2.

## 2017-07-26 NOTE — ED Provider Notes (Signed)
[facility] EMERGENCY DEPARTMENT Provider Note   CSN: [id number] Arrival date & time: 07/26/17  1106     History   Chief Complaint Chief Complaint  Patient presents with  . trach needs replaced    HPI Scott Rivera is a 67 y.o. male.  HPI Has trache came out sometime in the night.  Since aide arrived this morning and was unable to replace the 5.o Shiley.  She met resistance and there was bleeding.  She was able to place a pediatric 3 oh tube.  She reports patient is otherwise been doing well.  He has recently had his feeding tube placed and has been interactive without other medical problems. Past Medical History:  Diagnosis Date  . Anemia due to chronic kidney disease   . Aspiration pneumonia ([place])   . Chronic kidney disease, stage 3 (moderate) (HCC)   . Hypertension   . Stroke [facility])     Patient Active Problem List   Diagnosis Date Noted  . Hyperkalemia 06/26/2017  . Hematuria 06/26/2017  . Bronchitis/??? Pneumonia 03/30/2017  . S/P percutaneous endoscopic gastrostomy (PEG) tube placement ([place]) 03/30/2017  . Chronic anemia 03/30/2017  . Foley catheter in place on admission 03/30/2017  . UTI (urinary tract infection) 03/30/2017  . MDR Acinetobacter baumannii infection   . Gastrostomy tube obstruction ([place])   . PEG tube malfunction ([place])   . Acute respiratory failure with hypoxia ([place]) 03/02/2017  . Loculated pleural effusion   . Acute respiratory distress   . Fever   . Follow up   . HCAP (healthcare-associated pneumonia)   . Stage III pressure ulcer of sacral region ([place]) 12/17/2016  . Hypoxemia   . H/O tracheostomy   . Pneumothorax   . Pneumothorax, closed, traumatic, initial encounter 12/10/2016  . Sepsis ([place]) 12/09/2016  . Hypotension 12/09/2016  . Acute-on-chronic kidney injury ([place]) 12/09/2016  . Elevated troponin 12/09/2016  . Anemia due to chronic kidney disease 12/09/2016  . Compensated metabolic alkalosis 28/41/3244  . Pneumonia   .  Goals of care, counseling/discussion   . Palliative care by specialist   . Respiratory failure ([place])   . Aspiration into airway   . Septic shock ([place])   . Dysphagia, post-stroke   . Benign essential HTN   . Tobacco abuse   . Tachypnea   . Hyperglycemia   . Hypernatremia   . Leukocytosis (leucocytosis)   . Acute blood loss anemia   . Chronic kidney disease   . Pressure injury of skin 10/20/2016  . Cerebellar infarct ([place]) 10/19/2016  . ARF (acute renal failure) ([place]) 10/19/2016  . Essential hypertension 10/19/2016  . Rhabdomyolysis 10/19/2016  . History of completed stroke 10/19/2016  . Bilateral chronic knee pain 01/07/2016  . History of adenomatous polyp of colon 07/28/2015  . Hyperlipidemia 07/28/2013  . Helicobacter pylori infection 09/21/2012    Past Surgical History:  Procedure Laterality Date  . IR GASTROSTOMY TUBE MOD SED  10/27/2016  . IR PATIENT EVAL TECH 0-60 MINS  01/05/2017  . IR PATIENT EVAL TECH 0-60 MINS  01/09/2017  . IR PATIENT EVAL TECH 0-60 MINS  03/03/2017  . IR PATIENT EVAL TECH 0-60 MINS  04/03/2017  . IR [employer] TUBE PERCUT W/FLUORO  07/23/2017  . KNEE SURGERY    . TRACHEOSTOMY TUBE PLACEMENT          Home Medications    Prior to Admission medications   Medication Sig Start Date End Date Taking? Authorizing Provider  acetaminophen (TYLENOL) 500  MG tablet Place 1,000 mg into feeding tube 2 (two) times daily.     [provider]  amoxicillin-clavulanate (AUGMENTIN) 400-57 MG/5ML suspension Place 5 mLs into feeding tube 2 (two) times daily.    [provider]  aspirin 81 MG chewable tablet Place 1 tablet (81 mg total) into feeding tube daily. 04/05/17   Nita Sells, MD  atropine 1 % ophthalmic solution Place 2 drops under the tongue every 8 (eight) hours as needed (excessive secretion). 07/03/17   Lavina Hamman, MD  cloNIDine (CATAPRES - DOSED IN MG/24 HR) 0.1 mg/24hr patch Place 1 patch (0.1 mg total) onto the  skin once a week. Patient taking differently: Place 0.1 mg onto the skin every Tuesday.  04/10/17   Nita Sells, MD  clopidogrel (PLAVIX) 75 MG tablet Place 1 tablet (75 mg total) into feeding tube daily. 01/11/17   Cherene Altes, MD  dextromethorphan-guaiFENesin (MUCINEX DM) 30-600 MG 12hr tablet 1 tablet 2 (two) times daily.    [provider]  famotidine (PEPCID) 40 MG/5ML suspension Place 5 mLs (40 mg total) into feeding tube daily. Patient not taking: Reported on 07/16/2017 07/03/17   Lavina Hamman, MD  gabapentin (NEURONTIN) 100 MG capsule Take 100 mg See admin instructions by mouth. Open 1 capsule (100 mg) and administer contents via feeding tube three times daily    [provider]  ipratropium-albuterol (DUONEB) 0.5-2.5 (3) MG/3ML SOLN Take 3 mLs by nebulization every 4 (four) hours as needed. Patient taking differently: Take 3 mLs by nebulization every 4 (four) hours as needed (shortness of breath/wheezing).  01/10/17   Cherene Altes, MD  Neomycin-Bacitracin-Polymyxin (TRIPLE ANTIBIOTIC) [phone] OINT Apply 1 application topically 2 (two) times daily.     [provider]  Nutritional Supplements (FEEDING SUPPLEMENT, JEVITY 1.2 CAL,) LIQD Place 1,000 mLs into feeding tube continuous. Patient taking differently: Place 1,000 mLs continuous into feeding tube. 60 ml/hr 01/10/17   Cherene Altes, MD  polyethylene glycol [facility]) packet Take 17 g by mouth daily. Patient not taking: Reported on 06/26/2017 06/20/17   Glyn Ade, PA-C  pravastatin (PRAVACHOL) 20 MG tablet Place 1 tablet (20 mg total) into feeding tube daily at 6 PM. Patient taking differently: Place 20 mg daily into feeding tube.  01/10/17   Cherene Altes, MD  thiamine 100 MG tablet Place 1 tablet (100 mg total) into feeding tube daily. 01/11/17   Cherene Altes, MD  Water For Irrigation, Sterile (FREE WATER) SOLN Place 100 mLs into feeding tube daily. Patient taking  differently: Place 100 mLs into feeding tube every 3 (three) hours.  07/03/17   Lavina Hamman, MD    Family History Family History  Problem Relation Age of Onset  . Diabetes Mellitus II Brother   . CAD Neg Hx   . Stroke Neg Hx     Social History Social History   Tobacco Use  . Smoking status: Former Research scientist (life sciences)  . Smokeless tobacco: Never Used  . Tobacco comment: Smoked heavily until the day of his stroke  Substance Use Topics  . Alcohol use: Yes    Comment: Drank heavily prior to stroke per sister  . Drug use: Not on file     Allergies   Patient has no known allergies.   Review of Systems Review of Systems Cannot obtain review of systems level 5 caveat patient stroke.  Physical Exam Updated Vital Signs BP 121/64 (BP Location: Right Arm)   Pulse 82   Temp  98.5 F (36.9 C) (Oral)   Resp 20   SpO2 (!) 86% Comment: RN  &  Respiratory  notified of this   Physical Exam  Constitutional:  Patient is alert in appearance.  He is breathing through a pediatric tracheostomy tube without significant difficulty.  There is some pink tinged aspiratory secretions with coughing.    HENT:  Head: Normocephalic and atraumatic.  Neck:  Well-established tracheostomy site.  Pink tinged mucousy discharge with coughing.  Pulmonary/Chest: Effort normal.  Rest sounds coarse.  Neurological: He is alert.     ED Treatments / Results  Labs (all labs ordered are listed, but only abnormal results are displayed) Labs Reviewed - No data to display  EKG None  Radiology No results found.  Procedures TRACHEOSTOMY REPLACEMENT Date/Time: 07/26/2017 12:41 PM Performed by: Charlesetta Shanks, MD Authorized by: Charlesetta Shanks, MD  Consent given by: guardian Indications: fell out Local anesthesia used: no  Anesthesia: Local anesthesia used: no  Sedation: Patient sedated: no  Preparation: Patient was prepped and draped in the usual sterile fashion. Tube type: double cannula Tube cuff:  cuffless Tube size: 4.0 mm Patient tolerance: Patient tolerated the procedure well with no immediate complications Comments: 4.0 Shiley with obturator used to insert trach.  Patient placed in flat positioning, minimal resistance.  Tracheostomy placed without difficulty with slight pressure and rotation.  Patient had coughing with pink tinged frothy mucus.  He was suctioned and had no further difficulty breathing.    (including critical care time)  Medications Ordered in ED Medications - No data to display   Initial Impression / Assessment and Plan / ED Course  I have reviewed the triage vital signs and the nursing notes.  Pertinent labs & imaging results that were available during my care of the patient were reviewed by me and considered in my medical decision making (see chart for details).      Final Clinical Impressions(s) / ED Diagnoses   Final diagnoses:  None   Tracheostomy became dislodged during the night.  Patient's in-home nursing staff was unable to replace his Shiley.  This was replaced in the emergency department as outlined above.  Patient tolerated this well.  He has otherwise been well without any other acute complaints.  Will return to home with routine nursing care already established. ED Discharge Orders    None       Charlesetta Shanks, MD 07/26/17 1245

## 2017-07-26 NOTE — ED Triage Notes (Signed)
Pt to room 9 by PTAR, home health nurse and daughter at bedside, report pt pulled out trach this norning, not witnessed so unsure of time. HHN was not able to replace with original size 5.0, could only get 3.5 placed. Pt is a/a to baseline, non verbal.

## 2017-07-26 NOTE — Discharge Instructions (Addendum)
.    Continue your home tracheostomy care.  Follow-up with your provider as soon as possible for recheck.

## 2017-08-17 ENCOUNTER — Emergency Department (HOSPITAL_COMMUNITY): Payer: Medicare HMO

## 2017-08-17 ENCOUNTER — Emergency Department (HOSPITAL_COMMUNITY)
Admission: EM | Admit: 2017-08-17 | Discharge: 2017-08-17 | Disposition: A | Discharge status: Home / Self Care | Payer: Medicare HMO | Attending: Emergency Medicine | Admitting: Emergency Medicine

## 2017-08-17 DIAGNOSIS — R4182 Altered mental status, unspecified: Secondary | ICD-10-CM | POA: Diagnosis not present

## 2017-08-17 DIAGNOSIS — R05 Cough: Secondary | ICD-10-CM | POA: Diagnosis not present

## 2017-08-17 DIAGNOSIS — R0600 Dyspnea, unspecified: Secondary | ICD-10-CM | POA: Insufficient documentation

## 2017-08-17 DIAGNOSIS — R059 Cough, unspecified: Secondary | ICD-10-CM

## 2017-08-17 DIAGNOSIS — R4689 Other symptoms and signs involving appearance and behavior: Secondary | ICD-10-CM

## 2017-08-17 DIAGNOSIS — R531 Weakness: Secondary | ICD-10-CM

## 2017-08-17 LAB — CBC WITH DIFFERENTIAL/PLATELET
Basophils Absolute: 0 10*3/uL (ref 0.0–0.1)
Basophils Relative: 0 %
Eosinophils Absolute: 0.7 10*3/uL (ref 0.0–0.7)
Eosinophils Relative: 6 %
HCT: 44.6 % (ref 39.0–52.0)
Hemoglobin: 14 g/dL (ref 13.0–17.0)
Lymphocytes Relative: 13 %
Lymphs Abs: 1.6 10*3/uL (ref 0.7–4.0)
MCH: 29.1 pg (ref 26.0–34.0)
MCHC: 31.4 g/dL (ref 30.0–36.0)
MCV: 92.7 fL (ref 78.0–100.0)
Monocytes Absolute: 1.8 10*3/uL — ABNORMAL HIGH (ref 0.1–1.0)
Monocytes Relative: 15 %
Neutro Abs: 8 10*3/uL — ABNORMAL HIGH (ref 1.7–7.7)
Neutrophils Relative %: 66 %
Platelets: 331 10*3/uL (ref 150–400)
RBC: 4.81 MIL/uL (ref 4.22–5.81)
RDW: 16.5 % — ABNORMAL HIGH (ref 11.5–15.5)
WBC: 12.1 10*3/uL — ABNORMAL HIGH (ref 4.0–10.5)

## 2017-08-17 LAB — BASIC METABOLIC PANEL
Anion gap: 11 (ref 5–15)
BUN: 33 mg/dL — ABNORMAL HIGH (ref 6–20)
CO2: 27 mmol/L (ref 22–32)
Calcium: 10.2 mg/dL (ref 8.9–10.3)
Chloride: 101 mmol/L (ref 101–111)
Creatinine, Ser: 0.81 mg/dL (ref 0.61–1.24)
GFR calc Af Amer: >60 mL/min (ref >60)
GFR calc non Af Amer: >60 mL/min (ref >60)
Glucose, Bld: 81 mg/dL (ref 65–99)
Potassium: 4.7 mmol/L (ref 3.5–5.1)
Sodium: 139 mmol/L (ref 135–145)

## 2017-08-17 LAB — URINALYSIS, ROUTINE W REFLEX MICROSCOPIC
Bilirubin Urine: NEGATIVE
Glucose, UA: NEGATIVE mg/dL
Ketones, ur: NEGATIVE mg/dL
Nitrite: NEGATIVE
Protein, ur: 100 mg/dL — AB
Specific Gravity, Urine: 1.013 (ref 1.005–1.030)
WBC, UA: >50 WBC/hpf — ABNORMAL HIGH (ref 0–5)
pH: 7 (ref 5.0–8.0)

## 2017-08-17 LAB — BRAIN NATRIURETIC PEPTIDE: B Natriuretic Peptide: 13.2 pg/mL (ref 0.0–100.0)

## 2017-08-17 LAB — TROPONIN I: Troponin I: <0.03 ng/mL (ref <0.03)

## 2017-08-17 NOTE — Progress Notes (Signed)
Pt has a #4 shiley with a disposable inner cannula. RT suctioned the patient and added 28% ATC. RT will continue to monitor

## 2017-08-17 NOTE — ED Notes (Signed)
Phlebotomy called to come recollect again.

## 2017-08-17 NOTE — Progress Notes (Signed)
Pt suctioned for moderated amt of thick white secretions.  RT to monitor and assess as needed.

## 2017-08-17 NOTE — ED Triage Notes (Signed)
Per EMS, patient c/o lethargy and low 02. Oxygen was 83 per home health nurse-n usually on 2L. 92% per EMS. 6L, sat 96%. Pt not answering questions which is not normal for patient. No Pain. Pt has a trach. 12 lead showed elevation in some leads per EMS.

## 2017-08-17 NOTE — ED Notes (Signed)
Patient refusing labs and IV

## 2017-08-17 NOTE — ED Notes (Signed)
Pt transported to xray 

## 2017-08-17 NOTE — ED Provider Notes (Signed)
[facility] DEPT Provider Note   CSN: [id number] Arrival date & time: 08/17/17  1247     History   Chief Complaint Chief Complaint  Patient presents with  . Altered Mental Status  . Low O2    HPI ATTICUS WEDIN is a 67 y.o. male.  HPI History was provided by patient's sister.  Level 5 caveat for altered mental status  67 y.o.malewith medical history significant forhistory of CVA with resulting aphasia, weakness, PEG dependence, and tracheostomy comes in with chief complaint of altered mental status and hypoxia.  Patient also has had pseudomonal pneumonias in the past.  According to patient's sister, patient's home nurse advised that patient be brought to the ER.  They reported that patient was having more secretions and was hypoxic at home.  They also reported to the family that patient was more confused than usual.  According to patient's sister, patient does appear slightly more weak, otherwise they do not see any mental status changes.   Past Medical History:  Diagnosis Date  . Anemia due to chronic kidney disease   . Aspiration pneumonia ([place])   . Chronic kidney disease, stage 3 (moderate) (HCC)   . Hypertension   . Stroke [facility])     Patient Active Problem List   Diagnosis Date Noted  . Hyperkalemia 06/26/2017  . Hematuria 06/26/2017  . Bronchitis/??? Pneumonia 03/30/2017  . S/P percutaneous endoscopic gastrostomy (PEG) tube placement ([place]) 03/30/2017  . Chronic anemia 03/30/2017  . Foley catheter in place on admission 03/30/2017  . UTI (urinary tract infection) 03/30/2017  . MDR Acinetobacter baumannii infection   . Gastrostomy tube obstruction ([place])   . PEG tube malfunction ([place])   . Acute respiratory failure with hypoxia ([place]) 03/02/2017  . Loculated pleural effusion   . Acute respiratory distress   . Fever   . Follow up   . HCAP (healthcare-associated pneumonia)   . Stage III pressure ulcer of sacral region ([place])  12/17/2016  . Hypoxemia   . H/O tracheostomy   . Pneumothorax   . Pneumothorax, closed, traumatic, initial encounter 12/10/2016  . Sepsis ([place]) 12/09/2016  . Hypotension 12/09/2016  . Acute-on-chronic kidney injury ([place]) 12/09/2016  . Elevated troponin 12/09/2016  . Anemia due to chronic kidney disease 12/09/2016  . Compensated metabolic alkalosis 27/78/2423  . Pneumonia   . Goals of care, counseling/discussion   . Palliative care by specialist   . Respiratory failure ([place])   . Aspiration into airway   . Septic shock ([place])   . Dysphagia, post-stroke   . Benign essential HTN   . Tobacco abuse   . Tachypnea   . Hyperglycemia   . Hypernatremia   . Leukocytosis (leucocytosis)   . Acute blood loss anemia   . Chronic kidney disease   . Pressure injury of skin 10/20/2016  . Cerebellar infarct ([place]) 10/19/2016  . ARF (acute renal failure) ([place]) 10/19/2016  . Essential hypertension 10/19/2016  . Rhabdomyolysis 10/19/2016  . History of completed stroke 10/19/2016  . Bilateral chronic knee pain 01/07/2016  . History of adenomatous polyp of colon 07/28/2015  . Hyperlipidemia 07/28/2013  . Helicobacter pylori infection 09/21/2012    Past Surgical History:  Procedure Laterality Date  . IR GASTROSTOMY TUBE MOD SED  10/27/2016  . IR PATIENT EVAL TECH 0-60 MINS  01/05/2017  . IR PATIENT EVAL TECH 0-60 MINS  01/09/2017  . IR PATIENT EVAL TECH 0-60 MINS  03/03/2017  . IR PATIENT EVAL TECH 0-60 MINS  04/03/2017  . IR [place] TUBE PERCUT W/FLUORO  07/23/2017  . KNEE SURGERY    . TRACHEOSTOMY TUBE PLACEMENT          Home Medications    Prior to Admission medications   Medication Sig Start Date End Date Taking? Authorizing Provider  acetaminophen (TYLENOL) 500 MG tablet Place 1,000 mg into feeding tube 2 (two) times daily.    Yes [provider]  Amino Acids-Protein Hydrolys (FEEDING SUPPLEMENT, PRO-STAT SUGAR FREE 64,) LIQD Take 30 mLs by mouth at bedtime.   Yes  [provider]  aspirin 325 MG EC tablet Take 325 mg by mouth daily.   Yes [provider]  atropine 1 % ophthalmic solution Place 2 drops under the tongue every 8 (eight) hours as needed (excessive secretion). 07/03/17  Yes Lavina Hamman, MD  cloNIDine (CATAPRES - DOSED IN MG/24 HR) 0.1 mg/24hr patch Place 1 patch (0.1 mg total) onto the skin once a week. Patient taking differently: Place 0.1 mg onto the skin every Tuesday.  04/10/17  Yes Nita Sells, MD  clopidogrel (PLAVIX) 75 MG tablet Place 1 tablet (75 mg total) into feeding tube daily. 01/11/17  Yes Cherene Altes, MD  escitalopram (LEXAPRO) 10 MG tablet Take 10 mg by mouth daily.   Yes [provider]  gabapentin (NEURONTIN) 100 MG capsule Take 100 mg See admin instructions by mouth. Open 1 capsule (100 mg) and administer contents via feeding tube three times daily   Yes [provider]  ipratropium-albuterol (DUONEB) 0.5-2.5 (3) MG/3ML SOLN Take 3 mLs by nebulization every 4 (four) hours as needed. Patient taking differently: Take 3 mLs by nebulization every 4 (four) hours as needed (shortness of breath/wheezing).  01/10/17  Yes Cherene Altes, MD  montelukast (SINGULAIR) 10 MG tablet Take 10 mg by mouth at bedtime.   Yes [provider]  mouth rinse LIQD solution 10 mLs by Mouth Rinse route 4 (four) times daily.   Yes [provider]  pravastatin (PRAVACHOL) 20 MG tablet Place 1 tablet (20 mg total) into feeding tube daily at 6 PM. Patient taking differently: Place 20 mg daily into feeding tube.  01/10/17  Yes Cherene Altes, MD  thiamine 100 MG tablet Place 1 tablet (100 mg total) into feeding tube daily. 01/11/17  Yes Cherene Altes, MD  amoxicillin-clavulanate (AUGMENTIN) 400-57 MG/5ML suspension Place 5 mLs into feeding tube 2 (two) times daily.    [provider]  aspirin 81 MG chewable tablet Place 1 tablet (81 mg total) into feeding tube  daily. Patient not taking: Reported on 08/17/2017 04/05/17   Nita Sells, MD  dextromethorphan-guaiFENesin [facility] DM) 30-600 MG 12hr tablet 1 tablet 2 (two) times daily.    [provider]  famotidine (PEPCID) 40 MG/5ML suspension Place 5 mLs (40 mg total) into feeding tube daily. Patient not taking: Reported on 07/16/2017 07/03/17   Lavina Hamman, MD  Neomycin-Bacitracin-Polymyxin (TRIPLE ANTIBIOTIC) [phone] OINT Apply 1 application topically 2 (two) times daily.     [provider]  Nutritional Supplements (FEEDING SUPPLEMENT, JEVITY 1.2 CAL,) LIQD Place 1,000 mLs into feeding tube continuous. Patient not taking: Reported on 08/17/2017 01/10/17   Cherene Altes, MD  polyethylene glycol [facility]) packet Take 17 g by mouth daily. Patient not taking: Reported on 06/26/2017 06/20/17   Glyn Ade, PA-C  Water For Irrigation, Sterile (FREE WATER) SOLN Place 100 mLs into feeding tube daily. Patient taking differently: Place 100 mLs into feeding tube  every 3 (three) hours.  07/03/17   Lavina Hamman, MD    Family History Family History  Problem Relation Age of Onset  . Diabetes Mellitus II Brother   . CAD Neg Hx   . Stroke Neg Hx     Social History Social History   Tobacco Use  . Smoking status: Former Research scientist (life sciences)  . Smokeless tobacco: Never Used  . Tobacco comment: Smoked heavily until the day of his stroke  Substance Use Topics  . Alcohol use: Yes    Comment: Drank heavily prior to stroke per sister  . Drug use: Not on file     Allergies   Patient has no known allergies.   Review of Systems Review of Systems  Unable to perform ROS: Mental status change     Physical Exam Updated Vital Signs BP (!) 145/83   Pulse 93   Temp 98.1 F (36.7 C) (Oral)   Resp 18   Ht 5\' 7"  (1.702 m)   Wt 65.8 kg (145 lb)   SpO2 100%   BMI 22.71 kg/m   Physical Exam  Constitutional: He is oriented to person, place, and time. He appears well-developed.   HENT:  Head: Atraumatic.  Eyes: Pupils are equal, round, and reactive to light.  Neck: Neck supple.  No nuchal rigidity  Cardiovascular: Normal rate.  Pulmonary/Chest: Effort normal. He has no wheezes.  Abdominal: Soft. There is no tenderness.  G-tube in place  Neurological: He is alert and oriented to person, place, and time.  Skin: Skin is warm.  Nursing note and vitals reviewed.   ED Treatments / Results  Labs (all labs ordered are listed, but only abnormal results are displayed) Labs Reviewed  URINE CULTURE - Abnormal; Notable for the following components:      Result Value   Culture   (*)    Value: >=100,000 COLONIES/mL PSEUDOMONAS AERUGINOSA >=100,000 COLONIES/mL ENTEROCOCCUS FAECALIS    Organism ID, Bacteria PSEUDOMONAS AERUGINOSA (*)    Organism ID, Bacteria ENTEROCOCCUS FAECALIS (*)    All other components within normal limits  CBC WITH DIFFERENTIAL/PLATELET - Abnormal; Notable for the following components:   WBC 12.1 (*)    RDW 16.5 (*)    Neutro Abs 8.0 (*)    Monocytes Absolute 1.8 (*)    All other components within normal limits  URINALYSIS, ROUTINE W REFLEX MICROSCOPIC - Abnormal; Notable for the following components:   APPearance CLOUDY (*)    Hgb urine dipstick SMALL (*)    Protein, ur 100 (*)    Leukocytes, UA LARGE (*)    WBC, UA >50 (*)    Bacteria, UA RARE (*)    All other components within normal limits  BASIC METABOLIC PANEL - Abnormal; Notable for the following components:   BUN 33 (*)    All other components within normal limits  BRAIN NATRIURETIC PEPTIDE  TROPONIN I    EKG EKG Interpretation  Date/Time:  Friday Aug 17 2017 16:02:06 EDT Ventricular Rate:  84 PR Interval:    QRS Duration: 93 QT Interval:  343 QTC Calculation: 406 R Axis:   84 Text Interpretation:  Sinus rhythm Probable left atrial enlargement Consider right ventricular hypertrophy ST elevation suggests acute pericarditis No acute changes Nonspecific ST and T wave  abnormality Confirmed by Varney Biles ([id number]) on 08/17/2017 4:06:15 PM Also confirmed by Varney Biles [phone]), editor Lynder Parents [phone])  on 08/18/2017 11:18:06 AM   Radiology No results found.  Procedures Procedures (including critical care time)  Medications Ordered in ED Medications - No data to display   Initial Impression / Assessment and Plan / ED Course  I have reviewed the triage vital signs and the nursing notes.  Pertinent labs & imaging results that were available during my care of the patient were reviewed by me and considered in my medical decision making (see chart for details).    DDx includes: ICH / Stroke Sepsis syndrome Infection - UTI/Pneumonia Encephalopathy  Electrolyte abnormality Hypercapnia / COPD Hypoxia  67 year old male with history of stroke, status post trach and PEG comes in with chief complaint of altered mental status and hypoxia.  Patient allegedly is on 2 L of oxygen per trach and is requiring more oxygen.  Also patient has more secretions, and in the past he has had pseudomonal pneumonias.  Differential diagnosis mostly includes pneumonia.  We will get basic labs to make sure there is no significant electrolyte abnormalities, patient appears stable otherwise.   Final Clinical Impressions(s) / ED Diagnoses   Final diagnoses:  Altered behavior  Cough    ED Discharge Orders    None       Varney Biles, MD 08/22/17 Bosie Helper

## 2017-08-17 NOTE — Care Management Note (Signed)
Case Management Note  Patient Details  Name: Scott Rivera MRN: [id number] Date of Birth: Dec 13, 1950  CM noted pt was active with [facility].  Contacted Ronalee Belts who confirmed [name] PT OT.  Expected Discharge Date:                  Expected Discharge Plan:  [place]  Post Acute Care Choice:  Home Health  HH Arranged:  PT, OT [facility] Agency:  [facility] (formerly [facility]  Status of Service:  In process, will continue to follow  Rae Mar, RN 08/17/2017, 3:01 PM

## 2017-08-17 NOTE — ED Notes (Signed)
Pt unable to sign due to condition 

## 2017-08-17 NOTE — ED Notes (Signed)
Bed: WA07 Expected date:  Expected time:  Means of arrival:  Comments: EMS- AMS or weakness

## 2017-08-18 NOTE — ED Provider Notes (Signed)
Patient accepted at signout from Dr. Kathrynn Humble.  And to follow-up on diagnostic results and if stable patient would be appropriate for evaluation for discharge. Physical Exam  BP (!) 145/83   Pulse 93   Temp 98.1 F (36.7 C) (Oral)   Resp 18   Ht 5\' 7"  (1.702 m)   Wt 65.8 kg (145 lb)   SpO2 100%   BMI 22.71 kg/m   Physical Exam Patient is alert.  No distress at rest.  Significant underlying medical problems with tracheostomy and history of stroke. ED Course/Procedures     Procedures  MDM  Diagnostic studies are stable at baseline.  Patient's caregiver/family member notes that the patient seems to be at his normal baseline. Comfortable taking  Patient home for ongoing care.       Charlesetta Shanks, MD 08/18/17 Laureen Abrahams

## 2017-08-21 LAB — URINE CULTURE: Culture: >=100000 — AB

## 2017-08-22 ENCOUNTER — Telehealth: Payer: Self-pay | Admitting: Emergency Medicine

## 2017-08-22 NOTE — Progress Notes (Signed)
ED Antimicrobial Stewardship Positive Culture Follow Up   Scott Rivera is an 67 y.o. male who presented to [facility] on 08/17/2017 with a chief complaint of  Chief Complaint  Patient presents with  . Altered Mental Status  . Low O2    Recent Results (from the past 720 hour(s))  Urine culture     Status: Abnormal   Collection Time: 08/17/17  6:02 PM  Result Value Ref Range Status   Specimen Description   Final    URINE, RANDOM Performed at [facility] [address] [place], [place] [address]    Special Requests   Final    NONE Performed at [facility], [place] [address] [place], [place] [address]    Culture (A)  Final    >=100,000 COLONIES/mL PSEUDOMONAS AERUGINOSA >=100,000 COLONIES/mL ENTEROCOCCUS FAECALIS    Report Status 08/21/2017 FINAL  Final   Organism ID, Bacteria PSEUDOMONAS AERUGINOSA (A)  Final   Organism ID, Bacteria ENTEROCOCCUS FAECALIS (A)  Final      Susceptibility   Enterococcus faecalis - MIC*    AMPICILLIN <=2 SENSITIVE Sensitive     LEVOFLOXACIN >=8 RESISTANT Resistant     NITROFURANTOIN 32 SENSITIVE Sensitive     VANCOMYCIN 1 SENSITIVE Sensitive     * >=100,000 COLONIES/mL ENTEROCOCCUS FAECALIS   Pseudomonas aeruginosa - MIC*    CEFTAZIDIME 16 INTERMEDIATE Intermediate     CIPROFLOXACIN >=4 RESISTANT Resistant     GENTAMICIN 8 INTERMEDIATE Intermediate     IMIPENEM 2 SENSITIVE Sensitive     PIP/TAZO 16 SENSITIVE Sensitive     CEFEPIME 16 INTERMEDIATE Intermediate     * >=100,000 COLONIES/mL PSEUDOMONAS AERUGINOSA    Difficult to assess for symptoms of UTI. No GU exam but cx growing two bugs.   New antibiotic prescription: Fosfomycin 3g PO Q72h x 2 doses  ED Provider: Wyn Quaker PA-C   Reginia Naas 08/22/2017, 9:22 AM Infectious Diseases Pharmacist Phone# [phone]

## 2017-08-22 NOTE — ED Provider Notes (Signed)
I saw the results of the urine culture.  Patient was seen by me on 5/3 when he arrived to the ED with altered mental status and hypoxia.  Patient's labs overall looked reassuring and according to his sister who was at the bedside patient appeared slightly weaker but otherwise was at baseline self.  Patient was sent to the ER by home nurse who we were unable to touch base with.  Patient has history of stroke and he is trach, PEG 10 and has a chronic indwelling Foley catheter.  He has had complicated infection in the past involving the respiratory and the GU system.  We decided to observe patient in the ED for an extended period of time and reassess.  Eventually, my colleague had discharged him as patient status continued to improve.  The urine culture came back today and it is growing Pseudomonas and enterococcus.  I recall specifically asking the nurse to make sure that the urine dry is made appropriately given that patient has a chronic indwelling Foley catheter.  It seems like the culture results were discussed by 1 of our ED colleagues who has started patient on fosfomycin 3 g every 48 hours x2.  I just called the patient's home and spoke with patient's sister and Ms. [place], who is the home nurse.  Patient allegedly has been doing better.  He was started on Bactrim prophylactically.  I have advised him to take the fosfomycin as prescribed.  I also informed him of the culture results and they will return to the ER if there is any change in mental status or patient's overall condition.  Given that the patient is doing better over the past 2 or 3 days, I do not think he needs to come into the ER and get admitted to the hospital for IV antibiotics.  I also spoke with her pharmacist, and they confirmed that fosfomycin should cover both of those microbes.   Varney Biles, MD 08/22/17 [phone]

## 2017-08-22 NOTE — Telephone Encounter (Signed)
Post ED Visit - Positive Culture Follow-up: Successful Patient Follow-Up  Culture assessed and recommendations reviewed by: [x]  Elenor Quinones, Pharm.D. []  Heide Guile, Pharm.D., BCPS AQ-ID []  Parks Neptune, Pharm.D., BCPS []  Alycia Rossetti, Pharm.D., BCPS []  [place], Pharm.D., BCPS, AAHIVP []  Legrand Como, Pharm.D., BCPS, AAHIVP []  Salome Arnt, PharmD, BCPS []  Wynell Balloon, PharmD []  Vincenza Hews, PharmD, BCPS  Positive urine culture  [x]  Patient discharged without antimicrobial prescription and treatment is now indicated []  Organism is resistant to prescribed ED discharge antimicrobial []  Patient with positive blood cultures  Changes discussed with ED provider: Wyn Quaker PA New antibiotic prescription start fosfomycin 3G po q 72 hours x 2 doses Called to [employer] sister and [facility] Scott Rivera, Scott Rivera, 12:00 PM

## 2017-09-21 ENCOUNTER — Other Ambulatory Visit (HOSPITAL_COMMUNITY): Payer: Self-pay | Admitting: Internal Medicine

## 2017-09-21 DIAGNOSIS — R1310 Dysphagia, unspecified: Secondary | ICD-10-CM

## 2017-09-25 DIAGNOSIS — Z930 Tracheostomy status: Secondary | ICD-10-CM

## 2017-09-27 ENCOUNTER — Ambulatory Visit (HOSPITAL_COMMUNITY)
Admission: RE | Admit: 2017-09-27 | Discharge: 2017-09-27 | Disposition: A | Discharge status: Home / Self Care | Payer: Medicare HMO | Source: Ambulatory Visit | Attending: Internal Medicine | Admitting: Internal Medicine

## 2017-09-27 DIAGNOSIS — R1310 Dysphagia, unspecified: Secondary | ICD-10-CM | POA: Insufficient documentation

## 2017-09-30 ENCOUNTER — Emergency Department (HOSPITAL_COMMUNITY): Payer: Medicare HMO

## 2017-09-30 ENCOUNTER — Other Ambulatory Visit: Payer: Self-pay

## 2017-09-30 ENCOUNTER — Emergency Department (HOSPITAL_COMMUNITY)
Admission: EM | Admit: 2017-09-30 | Discharge: 2017-09-30 | Disposition: A | Discharge status: Home / Self Care | Payer: Medicare HMO | Attending: Emergency Medicine | Admitting: Emergency Medicine

## 2017-09-30 DIAGNOSIS — Z87891 Personal history of nicotine dependence: Secondary | ICD-10-CM | POA: Diagnosis not present

## 2017-09-30 DIAGNOSIS — J398 Other specified diseases of upper respiratory tract: Secondary | ICD-10-CM | POA: Diagnosis not present

## 2017-09-30 DIAGNOSIS — R05 Cough: Secondary | ICD-10-CM | POA: Insufficient documentation

## 2017-09-30 DIAGNOSIS — Z960 Presence of urogenital implants: Secondary | ICD-10-CM | POA: Diagnosis not present

## 2017-09-30 DIAGNOSIS — I129 Hypertensive chronic kidney disease with stage 1 through stage 4 chronic kidney disease, or unspecified chronic kidney disease: Secondary | ICD-10-CM | POA: Diagnosis not present

## 2017-09-30 DIAGNOSIS — Z7982 Long term (current) use of aspirin: Secondary | ICD-10-CM | POA: Diagnosis not present

## 2017-09-30 DIAGNOSIS — Z8673 Personal history of transient ischemic attack (TIA), and cerebral infarction without residual deficits: Secondary | ICD-10-CM | POA: Diagnosis not present

## 2017-09-30 DIAGNOSIS — Z7902 Long term (current) use of antithrombotics/antiplatelets: Secondary | ICD-10-CM | POA: Insufficient documentation

## 2017-09-30 DIAGNOSIS — Z79899 Other long term (current) drug therapy: Secondary | ICD-10-CM | POA: Diagnosis not present

## 2017-09-30 DIAGNOSIS — N183 Chronic kidney disease, stage 3 (moderate): Secondary | ICD-10-CM | POA: Diagnosis not present

## 2017-09-30 DIAGNOSIS — R1110 Vomiting, unspecified: Secondary | ICD-10-CM | POA: Diagnosis present

## 2017-09-30 LAB — URINALYSIS, ROUTINE W REFLEX MICROSCOPIC
Bilirubin Urine: NEGATIVE
Glucose, UA: NEGATIVE mg/dL
Ketones, ur: NEGATIVE mg/dL
Nitrite: NEGATIVE
Protein, ur: 100 mg/dL — AB
RBC / HPF: >50 RBC/hpf — ABNORMAL HIGH (ref 0–5)
Specific Gravity, Urine: 1.015 (ref 1.005–1.030)
WBC, UA: >50 WBC/hpf — ABNORMAL HIGH (ref 0–5)
pH: 6 (ref 5.0–8.0)

## 2017-09-30 LAB — CBC WITH DIFFERENTIAL/PLATELET
Abs Immature Granulocytes: 0.1 10*3/uL (ref 0.0–0.1)
Basophils Absolute: 0.1 10*3/uL (ref 0.0–0.1)
Basophils Relative: 1 %
Eosinophils Absolute: 0.3 10*3/uL (ref 0.0–0.7)
Eosinophils Relative: 2 %
HCT: 50.3 % (ref 39.0–52.0)
Hemoglobin: 15.2 g/dL (ref 13.0–17.0)
Immature Granulocytes: 1 %
Lymphocytes Relative: 10 %
Lymphs Abs: 1.3 10*3/uL (ref 0.7–4.0)
MCH: 28.7 pg (ref 26.0–34.0)
MCHC: 30.2 g/dL (ref 30.0–36.0)
MCV: 94.9 fL (ref 78.0–100.0)
Monocytes Absolute: 1.5 10*3/uL — ABNORMAL HIGH (ref 0.1–1.0)
Monocytes Relative: 11 %
Neutro Abs: 9.8 10*3/uL — ABNORMAL HIGH (ref 1.7–7.7)
Neutrophils Relative %: 75 %
Platelets: 334 10*3/uL (ref 150–400)
RBC: 5.3 MIL/uL (ref 4.22–5.81)
RDW: 15.1 % (ref 11.5–15.5)
WBC: 12.9 10*3/uL — ABNORMAL HIGH (ref 4.0–10.5)

## 2017-09-30 LAB — COMPREHENSIVE METABOLIC PANEL
ALT: 14 U/L — ABNORMAL LOW (ref 17–63)
AST: 47 U/L — ABNORMAL HIGH (ref 15–41)
Albumin: 3.1 g/dL — ABNORMAL LOW (ref 3.5–5.0)
Alkaline Phosphatase: 98 U/L (ref 38–126)
Anion gap: 8 (ref 5–15)
BUN: 30 mg/dL — ABNORMAL HIGH (ref 6–20)
CO2: 31 mmol/L (ref 22–32)
Calcium: 10.3 mg/dL (ref 8.9–10.3)
Chloride: 100 mmol/L — ABNORMAL LOW (ref 101–111)
Creatinine, Ser: 0.83 mg/dL (ref 0.61–1.24)
GFR calc Af Amer: >60 mL/min (ref >60)
GFR calc non Af Amer: >60 mL/min (ref >60)
Glucose, Bld: 85 mg/dL (ref 65–99)
Potassium: 5.7 mmol/L — ABNORMAL HIGH (ref 3.5–5.1)
Sodium: 139 mmol/L (ref 135–145)
Total Bilirubin: 1.6 mg/dL — ABNORMAL HIGH (ref 0.3–1.2)
Total Protein: 8.4 g/dL — ABNORMAL HIGH (ref 6.5–8.1)

## 2017-09-30 LAB — I-STAT TROPONIN, ED: Troponin i, poc: 0.01 ng/mL (ref 0.00–0.08)

## 2017-09-30 LAB — LIPASE, BLOOD: Lipase: 26 U/L (ref 11–51)

## 2017-09-30 LAB — I-STAT CG4 LACTIC ACID, ED: Lactic Acid, Venous: 1.59 mmol/L (ref 0.5–1.9)

## 2017-09-30 MED ORDER — SODIUM CHLORIDE 0.9 % IV BOLUS
1000.0000 mL | Freq: Once | INTRAVENOUS | Status: AC
  Administered 2017-09-30: 1000 mL via INTRAVENOUS

## 2017-09-30 NOTE — ED Notes (Signed)
RT arrived for initial assessment. Pt was at X-Ray.

## 2017-09-30 NOTE — ED Notes (Signed)
IV team at bedside 

## 2017-09-30 NOTE — ED Notes (Signed)
Patient transported to X-ray 

## 2017-09-30 NOTE — ED Notes (Signed)
Family member requested patient be suctioned in his trach. Family member then stated "It's obvious that he needs suctioning, anyone could hear it". Family member then stated "I don;t know what's going on around this place". RT called.

## 2017-09-30 NOTE — ED Notes (Signed)
RT in room at this time. 

## 2017-09-30 NOTE — Discharge Instructions (Addendum)
You were evaluated in the emergency department for increased secretions from your tracheostomy.  Your chest x-ray did not show an obvious pneumonia.  We got a sputum culture and the results of this should be back in a few days.  Should follow-up with your doctor and continue your regular medications.  Please return to the emergency department if any worsening symptoms.

## 2017-09-30 NOTE — ED Provider Notes (Signed)
[place] EMERGENCY DEPARTMENT Provider Note   CSN: [id number] Arrival date & time: 09/30/17  1210     History   Chief Complaint Chief Complaint  Patient presents with  . vomiting/congestion    HPI LUSTER HECHLER is a 67 y.o. male.  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  Patient is currently alone and can nod yes and no to questions but cannot give any further history.  The only information I have is he had an episode of vomiting today but when EMS arrived he was not in any type of respiratory distress.  They did suction some clear mucus from his trach but he is otherwise been stable.  Patient does nod that he has been coughing more often but is not indicating that he has any abdominal pain at this time.  Unclear when patient's Foley catheter was last changed.  Will obtain further history when his sister who is his caregiver arrives.  The history is provided by the EMS personnel and a caregiver. The history is limited by the absence of a caregiver.    Past Medical History:  Diagnosis Date  . Anemia due to chronic kidney disease   . Aspiration pneumonia ([place])   . Chronic kidney disease, stage 3 (moderate) (HCC)   . Hypertension   . Stroke [facility])     Patient Active Problem List   Diagnosis Date Noted  . Hyperkalemia 06/26/2017  . Hematuria 06/26/2017  . Bronchitis/??? Pneumonia 03/30/2017  . S/P percutaneous endoscopic gastrostomy (PEG) tube placement ([place]) 03/30/2017  . Chronic anemia 03/30/2017  . Foley catheter in place on admission 03/30/2017  . UTI (urinary tract infection) 03/30/2017  . MDR Acinetobacter baumannii infection   . Gastrostomy tube  obstruction ([place])   . PEG tube malfunction ([place])   . Acute respiratory failure with hypoxia ([place]) 03/02/2017  . Loculated pleural effusion   . Acute respiratory distress   . Fever   . Follow up   . HCAP (healthcare-associated pneumonia)   . 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 Hypoxemia   . H/O tracheostomy   . Pneumothorax   . Pneumothorax, closed, traumatic, initial encounter 12/10/2016  . Sepsis ([place]) 12/09/2016  . Hypotension 12/09/2016  . Acute-on-chronic kidney injury ([place]) 12/09/2016  . Elevated troponin 12/09/2016  . Anemia due to chronic kidney disease 12/09/2016  . Compensated metabolic alkalosis 35/36/1443  . Pneumonia   . Goals of care, counseling/discussion   . Palliative care by specialist   . Respiratory failure ([place])   . Aspiration into airway   . Septic shock ([place])   . Dysphagia, post-stroke   . Benign essential HTN   . Tobacco abuse   . Tachypnea   . Hyperglycemia   . Hypernatremia   . Leukocytosis (leucocytosis)   . Acute blood loss anemia   . Chronic kidney disease   . Pressure injury of skin 10/20/2016  . Cerebellar infarct ([place]) 10/19/2016  . ARF (acute renal failure) ([place]) 10/19/2016  . Essential hypertension 10/19/2016  . Rhabdomyolysis 10/19/2016  . History of completed stroke 10/19/2016  . Bilateral chronic knee pain 01/07/2016  . History of adenomatous polyp of colon 07/28/2015  . Hyperlipidemia 07/28/2013  .  Helicobacter pylori infection 09/21/2012    Past Surgical History:  Procedure Laterality Date  . IR GASTROSTOMY TUBE MOD SED  10/27/2016  . IR PATIENT EVAL TECH 0-60 MINS  01/05/2017  . IR PATIENT EVAL TECH 0-60 MINS  01/09/2017  . IR PATIENT EVAL TECH 0-60 MINS  03/03/2017  . IR PATIENT EVAL TECH 0-60 MINS  04/03/2017  . IR [place] TUBE PERCUT W/FLUORO  07/23/2017  . KNEE SURGERY    . TRACHEOSTOMY TUBE PLACEMENT          Home Medications    Prior to Admission medications   Medication Sig  Start Date End Date Taking? Authorizing Provider  acetaminophen (TYLENOL) 500 MG tablet Place 1,000 mg into feeding tube 2 (two) times daily.     [provider]  Amino Acids-Protein Hydrolys (FEEDING SUPPLEMENT, PRO-STAT SUGAR FREE 64,) LIQD Take 30 mLs by mouth at bedtime.    [provider]  amoxicillin-clavulanate (AUGMENTIN) 400-57 MG/5ML suspension Place 5 mLs into feeding tube 2 (two) times daily.    [provider]  aspirin 325 MG EC tablet Take 325 mg by mouth daily.    [provider]  aspirin 81 MG chewable tablet Place 1 tablet (81 mg total) into feeding tube daily. Patient not taking: Reported on 08/17/2017 04/05/17   Nita Sells, MD  atropine 1 % ophthalmic solution Place 2 drops under the tongue every 8 (eight) hours as needed (excessive secretion). 07/03/17   Lavina Hamman, MD  cloNIDine (CATAPRES - DOSED IN MG/24 HR) 0.1 mg/24hr patch Place 1 patch (0.1 mg total) onto the skin once a week. Patient taking differently: Place 0.1 mg onto the skin every Tuesday.  04/10/17   Nita Sells, MD  clopidogrel (PLAVIX) 75 MG tablet Place 1 tablet (75 mg total) into feeding tube daily. 01/11/17   Cherene Altes, MD  dextromethorphan-guaiFENesin (MUCINEX DM) 30-600 MG 12hr tablet 1 tablet 2 (two) times daily.    [provider]  escitalopram (LEXAPRO) 10 MG tablet Take 10 mg by mouth daily.    [provider]  famotidine (PEPCID) 40 MG/5ML suspension Place 5 mLs (40 mg total) into feeding tube daily. Patient not taking: Reported on 07/16/2017 07/03/17   Lavina Hamman, MD  gabapentin (NEURONTIN) 100 MG capsule Take 100 mg See admin instructions by mouth. Open 1 capsule (100 mg) and administer contents via feeding tube three times daily    [provider]  ipratropium-albuterol (DUONEB) 0.5-2.5 (3) MG/3ML SOLN Take 3 mLs by nebulization every 4 (four) hours as needed. Patient taking differently: Take 3 mLs by  nebulization every 4 (four) hours as needed (shortness of breath/wheezing).  01/10/17   Cherene Altes, MD  montelukast (SINGULAIR) 10 MG tablet Take 10 mg by mouth at bedtime.    [provider]  mouth rinse LIQD solution 10 mLs by Mouth Rinse route 4 (four) times daily.    [provider]  Neomycin-Bacitracin-Polymyxin (TRIPLE ANTIBIOTIC) [phone] OINT Apply 1 application topically 2 (two) times daily.     [provider]  Nutritional Supplements (FEEDING SUPPLEMENT, JEVITY 1.2 CAL,) LIQD Place 1,000 mLs into feeding tube continuous. Patient not taking: Reported on 08/17/2017 01/10/17   Cherene Altes, MD  polyethylene glycol [facility]) packet Take 17 g by mouth daily. Patient not taking: Reported on 06/26/2017 06/20/17   Glyn Ade, PA-C  pravastatin (PRAVACHOL) 20 MG tablet Place 1 tablet (20 mg total) into feeding tube daily at 6 PM. Patient taking  differently: Place 20 mg daily into feeding tube.  01/10/17   Cherene Altes, MD  thiamine 100 MG tablet Place 1 tablet (100 mg total) into feeding tube daily. 01/11/17   Cherene Altes, MD  Water For Irrigation, Sterile (FREE WATER) SOLN Place 100 mLs into feeding tube daily. Patient taking differently: Place 100 mLs into feeding tube every 3 (three) hours.  07/03/17   Lavina Hamman, MD    Family History Family History  Problem Relation Age of Onset  . Diabetes Mellitus II Brother   . CAD Neg Hx   . Stroke Neg Hx     Social History Social History   Tobacco Use  . Smoking status: Former Research scientist (life sciences)  . Smokeless tobacco: Never Used  . Tobacco comment: Smoked heavily until the day of his stroke  Substance Use Topics  . Alcohol use: Yes    Comment: Drank heavily prior to stroke per sister  . Drug use: Not on file     Allergies   Patient has no known allergies.   Review of Systems Review of Systems  All other systems reviewed and are negative.    Physical Exam Updated Vital Signs BP  126/84   Pulse (!) 102   Temp 99.3 F (37.4 C) (Rectal)   Resp (!) 22   Ht 5\' 6"  (1.676 m)   Wt 68 kg (150 lb)   SpO2 93%   BMI 24.21 kg/m   Physical Exam  Constitutional: He is oriented to person, place, and time. He appears well-developed and well-nourished. No distress.  HENT:  Head: Normocephalic and atraumatic.  Mouth/Throat: Oropharynx is clear and moist.  Eyes: Pupils are equal, round, and reactive to light. Conjunctivae and EOM are normal.  Neck: Normal range of motion. Neck supple.  Lurline Idol present with thin secretions  Cardiovascular: Normal rate, regular rhythm and intact distal pulses.  No murmur heard. Pulmonary/Chest: Effort normal. No respiratory distress. He has no wheezes. He has no rales.  Coarse breath sounds throughout  Abdominal: Soft. He exhibits no distension. There is no tenderness. There is no rebound and no guarding.  Peg present in the LUQ  Musculoskeletal: Normal range of motion. He exhibits no edema.  Chronic deformity of the bilateral knees  Neurological: He is alert and oriented to person, place, and time.  Paralysis of the RUE and RLE  Skin: Skin is warm and dry. No rash noted. No erythema.  Psychiatric: He has a normal mood and affect. His behavior is normal.  Nursing note and vitals reviewed.    ED Treatments / Results  Labs (all labs ordered are listed, but only abnormal results are displayed) Labs Reviewed  CULTURE, EXPECTORATED SPUTUM-ASSESSMENT  CBC WITH DIFFERENTIAL/PLATELET  COMPREHENSIVE METABOLIC PANEL  LIPASE, BLOOD  URINALYSIS, ROUTINE W REFLEX MICROSCOPIC  I-STAT TROPONIN, ED  I-STAT CG4 LACTIC ACID, ED    EKG EKG Interpretation  Date/Time:  Sunday September 30 2017 12:26:49 EDT Ventricular Rate:  108 PR Interval:    QRS Duration: 97 QT Interval:  306 QTC Calculation: 411 R Axis:   78 Text Interpretation:  Sinus tachycardia Probable left atrial enlargement RSR' in V1 or V2, right VCD or RVH ST elevation suggests acute  pericarditis No significant change since last tracing Confirmed by Blanchie Dessert [phone]) on 09/30/2017 12:58:28 PM Also confirmed by Blanchie Dessert [phone]), editor Philomena Doheny [phone])  on 09/30/2017 1:01:41 PM   Radiology Dg Abdomen Acute W/chest  Result Date: 09/30/2017 CLINICAL DATA:  Vomiting EXAM: DG ABDOMEN  ACUTE W/ 1V CHEST COMPARISON:  Chest x-ray 09/25/2017 FINDINGS: Tracheostomy is unchanged. Mild cardiomegaly and vascular congestion. Bibasilar atelectasis. Gastrostomy projects over the left abdomen. Large stool burden in the colon. No evidence of bowel obstruction or free air. No organomegaly IMPRESSION: Large stool burden. No obstruction or free air. Cardiomegaly, vascular congestion and bibasilar atelectasis. Electronically Signed   By: Rolm Baptise M.D.   On: 09/30/2017 13:13    Procedures Procedures (including critical care time)  Medications Ordered in ED Medications - No data to display   Initial Impression / Assessment and Plan / ED Course  I have reviewed the triage vital signs and the nursing notes.  Pertinent labs & imaging results that were available during my care of the patient were reviewed by me and considered in my medical decision making (see chart for details).    Patient presenting today from home with vomiting and some congestion.  Patient's sister arrived and noted today that he had significantly more secretions coming from his trach than normal and had an episode of emesis.  Patient is otherwise been doing well at home and had no acute issues.  Earlier this month he was on Bactrim and that was thought to be due to urinary tract infection or possible infection of his trach.  Patient is afebrile rectally today here at 99.3.  However will do work-up for infection given increased trach secretions and an episode of vomiting.  CBC, CMP, troponin, EKG, AAS and UA pending.  Will change Foley catheter.  Low suspicion at this time for bowel obstruction or acute  abdominal process at this time.  Patient given 1 L of IV fluids.  3:08 PM AAS neg for acute pathology.  Lactate wnl.  Patient last had pneumonia earlier this month and grew out Pseudomonas from sputum cultures.  He has known highly resistant urine organisms from his chronic indwelling Foley but takes Bactrim for prophylaxis.  Still awaiting labs. Pt checked out to Dr. Melina Copa at 1600.  Final Clinical Impressions(s) / ED Diagnoses   Final diagnoses:  None    ED Discharge Orders    None       Blanchie Dessert, MD 10/01/17 2245

## 2017-09-30 NOTE — ED Provider Notes (Signed)
Signout from Dr. Maryan Rued.  67 year old male with trach here with increased secretions.  He does have a history of similar issues that have responded well to suctioning.  His chest x-ray does not show any obvious pneumonia.  Plan is to follow-up on the rest of his lab work and likely he will be discharged back to home with the plan to follow-up on the respiratory culture.        Hayden Rasmussen, MD 10/01/17 [phone]

## 2017-09-30 NOTE — ED Notes (Signed)
Leaving with [facility] at this time. 

## 2017-09-30 NOTE — ED Notes (Signed)
Family member came in and started demanding to know who the EMS team was that brought the patient in. Family member states that she was "very clear" that he was to be sent to [place] and not here. Family member also began berating the RN who came with the family stating she was suppose to ride in the ambulance with the patient. Family member then began complaining about the level of care the patient was receiving stating " You cannot even put in an IV correctly."

## 2017-09-30 NOTE — ED Triage Notes (Signed)
Patient came from home after vomiting 2-3 times in the last 24 hours. Patient arrived with foley placed with obvious particulates in line and bag.

## 2017-10-03 LAB — CULTURE, RESPIRATORY W GRAM STAIN: Culture: NORMAL

## 2017-10-04 ENCOUNTER — Telehealth: Payer: Self-pay | Admitting: *Deleted

## 2017-10-04 NOTE — Telephone Encounter (Signed)
Post ED Visit - Positive Culture Follow-up  Culture report reviewed by antimicrobial stewardship pharmacist:  []  Elenor Quinones, Pharm.D. []  Heide Guile, Pharm.D., BCPS AQ-ID []  Parks Neptune, Pharm.D., BCPS []  Alycia Rossetti, Pharm.D., BCPS []  [place], Pharm.D., BCPS, AAHIVP []  Legrand Como, Pharm.D., BCPS, AAHIVP []  Salome Arnt, PharmD, BCPS []  Wynell Balloon, PharmD []  Vincenza Hews, PharmD, BCPS Angus Seller, PharmD  Positive sputum culture No further patient follow-up is required at this time.  Harlon Flor [facility] 10/04/2017, 10:04 AM

## 2017-11-16 ENCOUNTER — Inpatient Hospital Stay (HOSPITAL_COMMUNITY)
Admission: EM | Admit: 2017-11-16 | Discharge: 2017-11-22 | DRG: 871 | Disposition: A | Discharge status: Home Health | Payer: Medicare HMO | Attending: Internal Medicine | Admitting: Internal Medicine

## 2017-11-16 ENCOUNTER — Other Ambulatory Visit: Payer: Self-pay

## 2017-11-16 ENCOUNTER — Emergency Department (HOSPITAL_COMMUNITY): Payer: Medicare HMO

## 2017-11-16 ENCOUNTER — Encounter (HOSPITAL_COMMUNITY): Payer: Self-pay | Admitting: Nurse Practitioner

## 2017-11-16 DIAGNOSIS — E162 Hypoglycemia, unspecified: Secondary | ICD-10-CM | POA: Diagnosis not present

## 2017-11-16 DIAGNOSIS — J9601 Acute respiratory failure with hypoxia: Secondary | ICD-10-CM | POA: Diagnosis present

## 2017-11-16 DIAGNOSIS — I69320 Aphasia following cerebral infarction: Secondary | ICD-10-CM | POA: Diagnosis not present

## 2017-11-16 DIAGNOSIS — Z87891 Personal history of nicotine dependence: Secondary | ICD-10-CM

## 2017-11-16 DIAGNOSIS — N179 Acute kidney failure, unspecified: Secondary | ICD-10-CM | POA: Diagnosis present

## 2017-11-16 DIAGNOSIS — R1110 Vomiting, unspecified: Secondary | ICD-10-CM | POA: Diagnosis present

## 2017-11-16 DIAGNOSIS — R1310 Dysphagia, unspecified: Secondary | ICD-10-CM | POA: Diagnosis present

## 2017-11-16 DIAGNOSIS — I69359 Hemiplegia and hemiparesis following cerebral infarction affecting unspecified side: Secondary | ICD-10-CM | POA: Diagnosis not present

## 2017-11-16 DIAGNOSIS — D631 Anemia in chronic kidney disease: Secondary | ICD-10-CM | POA: Diagnosis present

## 2017-11-16 DIAGNOSIS — K5900 Constipation, unspecified: Secondary | ICD-10-CM | POA: Diagnosis present

## 2017-11-16 DIAGNOSIS — G8929 Other chronic pain: Secondary | ICD-10-CM | POA: Diagnosis present

## 2017-11-16 DIAGNOSIS — E440 Moderate protein-calorie malnutrition: Secondary | ICD-10-CM | POA: Diagnosis present

## 2017-11-16 DIAGNOSIS — Z7401 Bed confinement status: Secondary | ICD-10-CM

## 2017-11-16 DIAGNOSIS — J690 Pneumonitis due to inhalation of food and vomit: Secondary | ICD-10-CM | POA: Diagnosis present

## 2017-11-16 DIAGNOSIS — M25562 Pain in left knee: Secondary | ICD-10-CM | POA: Diagnosis present

## 2017-11-16 DIAGNOSIS — N182 Chronic kidney disease, stage 2 (mild): Secondary | ICD-10-CM | POA: Diagnosis present

## 2017-11-16 DIAGNOSIS — Z930 Tracheostomy status: Secondary | ICD-10-CM

## 2017-11-16 DIAGNOSIS — K9423 Gastrostomy malfunction: Secondary | ICD-10-CM | POA: Diagnosis present

## 2017-11-16 DIAGNOSIS — Z6821 Body mass index (BMI) 21.0-21.9, adult: Secondary | ICD-10-CM | POA: Diagnosis not present

## 2017-11-16 DIAGNOSIS — M25561 Pain in right knee: Secondary | ICD-10-CM | POA: Diagnosis present

## 2017-11-16 DIAGNOSIS — A419 Sepsis, unspecified organism: Principal | ICD-10-CM | POA: Diagnosis present

## 2017-11-16 DIAGNOSIS — I69391 Dysphagia following cerebral infarction: Secondary | ICD-10-CM

## 2017-11-16 DIAGNOSIS — I10 Essential (primary) hypertension: Secondary | ICD-10-CM | POA: Diagnosis not present

## 2017-11-16 DIAGNOSIS — R112 Nausea with vomiting, unspecified: Secondary | ICD-10-CM | POA: Diagnosis present

## 2017-11-16 DIAGNOSIS — Z931 Gastrostomy status: Secondary | ICD-10-CM

## 2017-11-16 DIAGNOSIS — Z833 Family history of diabetes mellitus: Secondary | ICD-10-CM | POA: Diagnosis not present

## 2017-11-16 DIAGNOSIS — Z7902 Long term (current) use of antithrombotics/antiplatelets: Secondary | ICD-10-CM | POA: Diagnosis not present

## 2017-11-16 DIAGNOSIS — Z8673 Personal history of transient ischemic attack (TIA), and cerebral infarction without residual deficits: Secondary | ICD-10-CM

## 2017-11-16 DIAGNOSIS — I129 Hypertensive chronic kidney disease with stage 1 through stage 4 chronic kidney disease, or unspecified chronic kidney disease: Secondary | ICD-10-CM | POA: Diagnosis present

## 2017-11-16 DIAGNOSIS — T17908A Unspecified foreign body in respiratory tract, part unspecified causing other injury, initial encounter: Secondary | ICD-10-CM | POA: Diagnosis present

## 2017-11-16 DIAGNOSIS — N189 Chronic kidney disease, unspecified: Secondary | ICD-10-CM | POA: Diagnosis present

## 2017-11-16 DIAGNOSIS — J189 Pneumonia, unspecified organism: Secondary | ICD-10-CM

## 2017-11-16 DIAGNOSIS — N183 Chronic kidney disease, stage 3 (moderate): Secondary | ICD-10-CM | POA: Diagnosis not present

## 2017-11-16 DIAGNOSIS — Z9889 Other specified postprocedural states: Secondary | ICD-10-CM

## 2017-11-16 LAB — COMPREHENSIVE METABOLIC PANEL
ALT: 22 U/L (ref 0–44)
AST: 24 U/L (ref 15–41)
Albumin: 3.5 g/dL (ref 3.5–5.0)
Alkaline Phosphatase: 107 U/L (ref 38–126)
Anion gap: 12 (ref 5–15)
BUN: 38 mg/dL — ABNORMAL HIGH (ref 8–23)
CO2: 32 mmol/L (ref 22–32)
Calcium: 10.3 mg/dL (ref 8.9–10.3)
Chloride: 97 mmol/L — ABNORMAL LOW (ref 98–111)
Creatinine, Ser: 0.94 mg/dL (ref 0.61–1.24)
GFR calc Af Amer: >60 mL/min (ref >60)
GFR calc non Af Amer: >60 mL/min (ref >60)
Glucose, Bld: 93 mg/dL (ref 70–99)
Potassium: 4.4 mmol/L (ref 3.5–5.1)
Sodium: 141 mmol/L (ref 135–145)
Total Bilirubin: 0.3 mg/dL (ref 0.3–1.2)
Total Protein: 8.9 g/dL — ABNORMAL HIGH (ref 6.5–8.1)

## 2017-11-16 LAB — CBC WITH DIFFERENTIAL/PLATELET
Basophils Absolute: 0 10*3/uL (ref 0.0–0.1)
Basophils Relative: 0 %
Eosinophils Absolute: 0.2 10*3/uL (ref 0.0–0.7)
Eosinophils Relative: 2 %
HCT: 53.8 % — ABNORMAL HIGH (ref 39.0–52.0)
Hemoglobin: 17.1 g/dL — ABNORMAL HIGH (ref 13.0–17.0)
Lymphocytes Relative: 13 %
Lymphs Abs: 1.8 10*3/uL (ref 0.7–4.0)
MCH: 30.5 pg (ref 26.0–34.0)
MCHC: 31.8 g/dL (ref 30.0–36.0)
MCV: 95.9 fL (ref 78.0–100.0)
Monocytes Absolute: 1.6 10*3/uL — ABNORMAL HIGH (ref 0.1–1.0)
Monocytes Relative: 12 %
Neutro Abs: 10.4 10*3/uL — ABNORMAL HIGH (ref 1.7–7.7)
Neutrophils Relative %: 73 %
Platelets: 323 10*3/uL (ref 150–400)
RBC: 5.61 MIL/uL (ref 4.22–5.81)
RDW: 15.1 % (ref 11.5–15.5)
WBC: 14.1 10*3/uL — ABNORMAL HIGH (ref 4.0–10.5)

## 2017-11-16 LAB — I-STAT TROPONIN, ED: Troponin i, poc: 0.02 ng/mL (ref 0.00–0.08)

## 2017-11-16 LAB — LIPASE, BLOOD: Lipase: 22 U/L (ref 11–51)

## 2017-11-16 LAB — I-STAT CG4 LACTIC ACID, ED: Lactic Acid, Venous: 1.46 mmol/L (ref 0.5–1.9)

## 2017-11-16 MED ORDER — GABAPENTIN 100 MG PO CAPS: 100.0000 mg | ORAL_CAPSULE | ORAL | Status: DC

## 2017-11-16 MED ORDER — PIPERACILLIN-TAZOBACTAM 3.375 G IVPB
3.3750 g | Freq: Three times a day (TID) | INTRAVENOUS | Status: DC
  Administered 2017-11-17 – 2017-11-20 (×10): 3.375 g via INTRAVENOUS
  Filled 2017-11-16 (×11): qty 50

## 2017-11-16 MED ORDER — ONDANSETRON HCL 4 MG/2ML IJ SOLN
4.0000 mg | Freq: Once | INTRAMUSCULAR | Status: AC
  Administered 2017-11-16: 4 mg via INTRAVENOUS
  Filled 2017-11-16: qty 2

## 2017-11-16 MED ORDER — PIPERACILLIN-TAZOBACTAM 4.5 G IVPB: 4.5000 g | Freq: Once | INTRAVENOUS | Status: DC

## 2017-11-16 MED ORDER — THIAMINE HCL 100 MG PO TABS: 100.0000 mg | ORAL_TABLET | Freq: Every day | ORAL | Status: DC

## 2017-11-16 MED ORDER — GABAPENTIN 250 MG/5ML PO SOLN
100.0000 mg | Freq: Three times a day (TID) | ORAL | Status: DC
  Administered 2017-11-16 – 2017-11-22 (×11): 100 mg
  Filled 2017-11-16 (×20): qty 2

## 2017-11-16 MED ORDER — ATROPINE SULFATE 1 % OP SOLN
2.0000 [drp] | Freq: Three times a day (TID) | OPHTHALMIC | Status: DC | PRN
  Administered 2017-11-20 – 2017-11-21 (×3): 2 [drp] via SUBLINGUAL
  Filled 2017-11-16: qty 2

## 2017-11-16 MED ORDER — ONDANSETRON HCL 4 MG/2ML IJ SOLN
4.0000 mg | Freq: Four times a day (QID) | INTRAMUSCULAR | Status: DC | PRN
  Administered 2017-11-17: 4 mg via INTRAVENOUS
  Filled 2017-11-16: qty 2

## 2017-11-16 MED ORDER — SODIUM CHLORIDE 0.9 % IV BOLUS
500.0000 mL | Freq: Once | INTRAVENOUS | Status: AC
  Administered 2017-11-16: 500 mL via INTRAVENOUS

## 2017-11-16 MED ORDER — RANITIDINE HCL 150 MG/10ML PO SYRP
150.0000 mg | ORAL_SOLUTION | Freq: Every day | ORAL | Status: DC
  Administered 2017-11-16 – 2017-11-18 (×3): 150 mg
  Filled 2017-11-16 (×4): qty 10

## 2017-11-16 MED ORDER — ENOXAPARIN SODIUM 40 MG/0.4ML ~~LOC~~ SOLN
40.0000 mg | SUBCUTANEOUS | Status: DC
  Administered 2017-11-16 – 2017-11-21 (×6): 40 mg via SUBCUTANEOUS
  Filled 2017-11-16 (×6): qty 0.4

## 2017-11-16 MED ORDER — ORAL CARE MOUTH RINSE
10.0000 mL | Freq: Four times a day (QID) | OROMUCOSAL | Status: DC
  Administered 2017-11-16 – 2017-11-20 (×8): 10 mL via OROMUCOSAL

## 2017-11-16 MED ORDER — ACETAMINOPHEN 500 MG PO TABS
1000.0000 mg | ORAL_TABLET | Freq: Three times a day (TID) | ORAL | Status: DC
  Administered 2017-11-16 – 2017-11-22 (×15): 1000 mg
  Filled 2017-11-16 (×15): qty 2

## 2017-11-16 MED ORDER — VITAMIN B-1 100 MG PO TABS
100.0000 mg | ORAL_TABLET | Freq: Every day | ORAL | Status: DC
  Administered 2017-11-17 – 2017-11-18 (×2): 100 mg
  Filled 2017-11-16 (×2): qty 1

## 2017-11-16 MED ORDER — FAMOTIDINE 40 MG/5ML PO SUSR
40.0000 mg | Freq: Every day | ORAL | Status: DC
  Filled 2017-11-16: qty 5

## 2017-11-16 MED ORDER — SODIUM CHLORIDE 0.9 % IV SOLN
INTRAVENOUS | Status: DC
  Administered 2017-11-16 – 2017-11-18 (×2): via INTRAVENOUS

## 2017-11-16 MED ORDER — IPRATROPIUM-ALBUTEROL 0.5-2.5 (3) MG/3ML IN SOLN: 3.0000 mL | RESPIRATORY_TRACT | Status: DC | PRN

## 2017-11-16 MED ORDER — CLONIDINE HCL 0.1 MG/24HR TD PTWK: 0.1000 mg | MEDICATED_PATCH | TRANSDERMAL | Status: DC

## 2017-11-16 MED ORDER — CLONIDINE HCL 0.1 MG/24HR TD PTWK
0.1000 mg | MEDICATED_PATCH | TRANSDERMAL | Status: DC
  Administered 2017-11-17: 0.1 mg via TRANSDERMAL
  Filled 2017-11-16: qty 1

## 2017-11-16 MED ORDER — PIPERACILLIN-TAZOBACTAM 3.375 G IVPB 30 MIN
3.3750 g | Freq: Once | INTRAVENOUS | Status: AC
  Administered 2017-11-16: 3.375 g via INTRAVENOUS
  Filled 2017-11-16: qty 50

## 2017-11-16 MED ORDER — PRAVASTATIN SODIUM 20 MG PO TABS
20.0000 mg | ORAL_TABLET | Freq: Every day | ORAL | Status: DC
  Administered 2017-11-17: 20 mg
  Filled 2017-11-16: qty 1

## 2017-11-16 MED ORDER — MONTELUKAST SODIUM 10 MG PO TABS
10.0000 mg | ORAL_TABLET | Freq: Every day | ORAL | Status: DC
  Administered 2017-11-16: 10 mg via ORAL
  Filled 2017-11-16: qty 1

## 2017-11-16 MED ORDER — CLOPIDOGREL BISULFATE 75 MG PO TABS
75.0000 mg | ORAL_TABLET | Freq: Every day | ORAL | Status: DC
  Administered 2017-11-17 – 2017-11-22 (×5): 75 mg
  Filled 2017-11-16 (×5): qty 1

## 2017-11-16 MED ORDER — PRO-STAT SUGAR FREE PO LIQD
30.0000 mL | Freq: Every day | ORAL | Status: DC
  Administered 2017-11-16 – 2017-11-21 (×5): 30 mL via ORAL
  Filled 2017-11-16 (×5): qty 30

## 2017-11-16 NOTE — ED Triage Notes (Signed)
Pt is presented from home, c/o couple of episodes of vomiting that started yesterday. Also requesting trach care, RT has been paged to assist with.

## 2017-11-16 NOTE — H&P (Signed)
History and Physical    Scott Rivera YQI:[id number] DOB: May 25, 1950 DOA: 11/16/2017  PCP: Jamesetta Orleans, PA-C  Patient coming from: Home  Chief Complaint: Shortness of breath and vomit through his trach  HPI: Scott Rivera is a 67 y.o. 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  He is on trach collar only does not require any supplemental oxygen at home.  Denies any fevers.  Patient currently at his baseline respiratory status but on chest x-ray has bilateral lower infiltrates.  Patient referred for admission for aspiration pneumonia.  Has been noted.abdominal pain.  Has not had any further vomiting today.  Review of Systems: As per HPI otherwise 10 point review of systems negative per family members unobtainable from patient himself due to history of stroke  Past Medical History:  Diagnosis Date  . Anemia due to chronic kidney disease   . Aspiration pneumonia ([place])   . Chronic kidney disease, stage 3 (moderate) (HCC)   . Hypertension   . Stroke [facility])     Past Surgical History:  Procedure Laterality Date  . IR GASTROSTOMY TUBE MOD SED  10/27/2016  . IR PATIENT EVAL TECH 0-60 MINS  01/05/2017  . IR PATIENT EVAL TECH 0-60 MINS  01/09/2017  . IR PATIENT EVAL TECH 0-60 MINS  03/03/2017  . IR PATIENT EVAL TECH 0-60 MINS  04/03/2017  . IR [place] TUBE PERCUT W/FLUORO  07/23/2017  . KNEE SURGERY    . TRACHEOSTOMY TUBE PLACEMENT       reports that he has quit smoking. He has never used smokeless tobacco. He reports that he drinks alcohol. His drug history is not on file.  No Known Allergies  Family History  Problem Relation Age of Onset  . Diabetes Mellitus II Brother   . CAD Neg Hx   .  Stroke Neg Hx     Prior to Admission medications   Medication Sig Start Date End Date Taking? Authorizing Provider  acetaminophen (TYLENOL) 500 MG tablet Place 1,000 mg into feeding tube 3 (three) times daily.    Yes [provider]  Amino Acids-Protein Hydrolys (FEEDING SUPPLEMENT, PRO-STAT SUGAR FREE 64,) LIQD Take 30 mLs by mouth at bedtime.   Yes [provider]  aspirin 81 MG chewable tablet Place 1 tablet (81 mg total) into feeding tube daily. Patient taking differently: Place 325 mg into feeding tube daily.  04/05/17  Yes Nita Sells, MD  atropine 1 % ophthalmic solution Place 2 drops under the tongue every 8 (eight) hours as needed (excessive secretion). 07/03/17  Yes Lavina Hamman, MD  cloNIDine (CATAPRES - DOSED IN MG/24 HR) 0.1 mg/24hr patch Place 1 patch (0.1 mg total) onto the skin once a week. Patient taking differently: Place 0.1 mg onto the skin every Tuesday.  04/10/17  Yes Nita Sells, MD  clopidogrel (PLAVIX) 75 MG tablet Place 1 tablet (75 mg total) into feeding tube daily. 01/11/17  Yes Cherene Altes, MD  gabapentin (NEURONTIN) 100 MG capsule Take 100 mg See admin instructions by mouth. Open 1 capsule (100 mg) and administer contents via feeding tube three times daily   Yes [provider]  ipratropium-albuterol (DUONEB) 0.5-2.5 (3) MG/3ML SOLN Take 3 mLs by nebulization every  4 (four) hours as needed. Patient taking differently: Take 3 mLs by nebulization every 4 (four) hours as needed (shortness of breath/wheezing).  01/10/17  Yes Cherene Altes, MD  montelukast (SINGULAIR) 10 MG tablet Take 10 mg by mouth at bedtime.   Yes [provider]  mouth rinse LIQD solution 10 mLs by Mouth Rinse route 4 (four) times daily.   Yes [provider]  pravastatin (PRAVACHOL) 20 MG tablet Place 1 tablet (20 mg total) into feeding tube daily at 6 PM. Patient taking differently: Place 20 mg daily into feeding tube.   01/10/17  Yes Cherene Altes, MD  thiamine 100 MG tablet Place 1 tablet (100 mg total) into feeding tube daily. 01/11/17  Yes Cherene Altes, MD  Water For Irrigation, Sterile (FREE WATER) SOLN Place 100 mLs into feeding tube daily. Patient taking differently: Place 100 mLs into feeding tube every 3 (three) hours.  07/03/17  Yes Lavina Hamman, MD  famotidine (PEPCID) 40 MG/5ML suspension Place 5 mLs (40 mg total) into feeding tube daily. Patient not taking: Reported on 07/16/2017 07/03/17   Lavina Hamman, MD  Nutritional Supplements (FEEDING SUPPLEMENT, JEVITY 1.2 CAL,) LIQD Place 1,000 mLs into feeding tube continuous. Patient not taking: Reported on 11/16/2017 01/10/17   Cherene Altes, MD  polyethylene glycol [facility]) packet Take 17 g by mouth daily. Patient not taking: Reported on 06/26/2017 06/20/17   Glyn Ade, PA-C    Physical Exam: Vitals:   11/16/17 1527 11/16/17 1712  BP: (!) 115/100 (!) 160/84  Pulse: (!) 110 (!) 120  Resp: 20 18  Temp: 98.9 F (37.2 C)   TempSrc: Oral   SpO2: 97% 96%      Constitutional: NAD, calm, comfortable trach clean dry and intact chronically debilitated Vitals:   11/16/17 1527 11/16/17 1712  BP: (!) 115/100 (!) 160/84  Pulse: (!) 110 (!) 120  Resp: 20 18  Temp: 98.9 F (37.2 C)   TempSrc: Oral   SpO2: 97% 96%   Eyes: PERRL, lids and conjunctivae normal ENMT: Mucous membranes are moist. Posterior pharynx clear of any exudate or lesions.Normal dentition.  Neck: normal, supple, no masses, no thyromegaly Respiratory: clear to auscultation bilaterally, no wheezing, no crackles. Normal respiratory effort. No accessory muscle use.  Rhonchi bilaterally and diffusely Cardiovascular: Regular rate and rhythm, no murmurs / rubs / gallops. No extremity edema. 2+ pedal pulses. No carotid bruits.  Abdomen: no tenderness, no masses palpated. No hepatosplenomegaly. Bowel sounds positive.  Musculoskeletal: no clubbing / cyanosis. No joint  deformity upper and lower extremities. Good ROM, no contractures. Normal muscle tone.  Skin: no rashes, lesions, ulcers. No induration Neurologic: Cranial nerves II through XII grossly intact.  Contracted. Psychiatric: Not normal judgment and insight. Alert and oriented x 0. Normal mood.    Labs on Admission: I have personally reviewed following labs and imaging studies  CBC: Recent Labs  Lab 11/16/17 1642  WBC 14.1*  NEUTROABS 10.4*  HGB 17.1*  HCT 53.8*  MCV 95.9  PLT 454   Basic Metabolic Panel: Recent Labs  Lab 11/16/17 1642  NA 141  K 4.4  CL 97*  CO2 32  GLUCOSE 93  BUN 38*  CREATININE 0.94  CALCIUM 10.3   GFR: CrCl cannot be calculated (Unknown ideal weight.). Liver Function Tests: Recent Labs  Lab 11/16/17 1642  AST 24  ALT 22  ALKPHOS 107  BILITOT 0.3  PROT 8.9*  ALBUMIN 3.5   Recent Labs  Lab 11/16/17  1642  LIPASE 22   No results for input(s): AMMONIA in the last 168 hours. Coagulation Profile: No results for input(s): INR, PROTIME in the last 168 hours. Cardiac Enzymes: No results for input(s): CKTOTAL, CKMB, CKMBINDEX, TROPONINI in the last 168 hours. BNP (last 3 results) No results for input(s): PROBNP in the last 8760 hours. HbA1C: No results for input(s): HGBA1C in the last 72 hours. CBG: No results for input(s): GLUCAP in the last 168 hours. Lipid Profile: No results for input(s): CHOL, HDL, LDLCALC, TRIG, CHOLHDL, LDLDIRECT in the last 72 hours. Thyroid Function Tests: No results for input(s): TSH, T4TOTAL, FREET4, T3FREE, THYROIDAB in the last 72 hours. Anemia Panel: No results for input(s): VITAMINB12, FOLATE, FERRITIN, TIBC, IRON, RETICCTPCT in the last 72 hours. Urine analysis:    Component Value Date/Time   COLORURINE YELLOW 09/30/2017 1800   APPEARANCEUR CLOUDY (A) 09/30/2017 1800   LABSPEC 1.015 09/30/2017 1800   PHURINE 6.0 09/30/2017 1800   GLUCOSEU NEGATIVE 09/30/2017 1800   HGBUR MODERATE (A) 09/30/2017 1800    BILIRUBINUR NEGATIVE 09/30/2017 1800   KETONESUR NEGATIVE 09/30/2017 1800   PROTEINUR 100 (A) 09/30/2017 1800   NITRITE NEGATIVE 09/30/2017 1800   LEUKOCYTESUR LARGE (A) 09/30/2017 1800   Sepsis Labs: !!!!!!!!!!!!!!!!!!!!!!!!!!!!!!!!!!!!!!!!!!!! @LABRCNTIP (procalcitonin:4,lacticidven:4) )No results found for this or any previous visit (from the past 240 hour(s)).   Radiological Exams on Admission: Dg Chest 2 View  Result Date: 11/16/2017 CLINICAL DATA:  Spitting up brown mucus. EXAM: CHEST - 2 VIEW COMPARISON:  09/30/2017. FINDINGS: Heart size upper limits normal. Diffuse BILATERAL pulmonary opacities, lower lobe predominant; concern for pneumonia. Tracheostomy in good position. Surgical clips lung LEFT apex. No osseous findings. IMPRESSION: BILATERAL pulmonary opacities represent worsening aeration, concern for pneumonia raised. Tracheostomy good position. Unchanged heart size. Electronically Signed   By: Staci Righter M.D.   On: 11/16/2017 18:02   Dg Abd 2 Views  Result Date: 11/16/2017 CLINICAL DATA:  Abdominal pain.  Vomiting. EXAM: ABDOMEN - 2 VIEW COMPARISON:  None. FINDINGS: There is no visible bowel obstruction or free air. However there is massive stool burden in the rectal vault, as well as the RIGHT colon and transverse colon consistent with extensive constipation. Surgical clips mid abdomen. No radiopaque calculi are observed. Vascular calcification is noted. There is degenerative change both hips. IMPRESSION: Extensive constipation.  No acute findings. Electronically Signed   By: Staci Righter M.D.   On: 11/16/2017 18:03    Old chart reviewed Case discussed with Dr. Laverta Baltimore in the ED Chest x-ray reviewed bilateral pulmonary infiltrates at the bases noted  Assessment/Plan 67 year old chronically debilitated male comes in with vomiting and aspiration bilateral pneumonia Principal Problem:   Aspiration pneumonia (HCC)-placed on IV Zosyn.  Obtain blood and sputum cultures.  O2 sats are  normal.  Vitals are normal.  Patient is chronically tachycardic at home per family report.  Patient in room no respiratory distress and not septic at this time.  Active Problems:   Aspiration into airway-as above    Nausea and vomiting-abdominal exam benign.  LFTs and lipase are normal.  PRN Zofran ordered.    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 for  now due to aspiration as above and nausea and vomiting     DVT prophylaxis: Lovenox Code Status: Full Family Communication: Caretaker at bedside Disposition Plan: 1 to 3 days Consults called: None Admission status: Admission   [place] Code A MD [facility]  If 7PM-7AM, please contact night-coverage [email or web address] Password [email or web address]  11/16/2017, 6:50 PM

## 2017-11-16 NOTE — Progress Notes (Signed)
Pharmacy Antibiotic Note  Scott Rivera is a 67 y.o. male with trach presented to the ED on 11/16/2017 with n/v. CXR showed findings with concern for PNA.  To start zosyn for suspected aspiration PNA.  - scr 0.94 (crcl~77)  Plan: - zosyn 3.375 gm IV q8h (infuse over 4 hrs) - pharmacy will sign off for zosyn.  Re-consult [place] if need further assistance. _________________________________    Temp (24hrs), Avg:98.9 F (37.2 C), Min:98.9 F (37.2 C), Max:98.9 F (37.2 C)  Recent Labs  Lab 11/16/17 1642 11/16/17 1649  WBC 14.1*  --   CREATININE 0.94  --   LATICACIDVEN  --  1.46    CrCl cannot be calculated (Unknown ideal weight.).    No Known Allergies   Thank you for allowing pharmacy to be a part of this patient's care.  Lynelle Doctor 11/16/2017 7:05 PM

## 2017-11-16 NOTE — ED Notes (Signed)
ED TO INPATIENT HANDOFF REPORT  Name/Age/Gender Scott Rivera 67 y.o. male  Code Status    Code Status Orders  (From admission, onward)        Start     Ordered   11/16/17 1852  Full code  Continuous     11/16/17 1854    Code Status History    Date Active Date Inactive Code Status Order ID Comments User Context   06/27/2017 0019 07/03/2017 1641 Full Code [id number]  Vianne Bulls, MD ED   03/31/2017 1143 04/04/2017 1713 Full Code [id number]  Debbe Odea, MD Inpatient   03/02/2017 2359 03/07/2017 2252 Full Code [id number]  Toy Baker, MD Inpatient   12/22/2016 1351 01/10/2017 1708 Full Code [id number]  Donita Brooks, NP ED   12/09/2016 1139 12/21/2016 1822 Full Code [id number]  Lady Deutscher, MD Inpatient   10/19/2016 2100 11/29/2016 1620 Full Code [id number]  Rise Patience, MD Inpatient      Home/SNF/Other Home  Chief Complaint emesis  Level of Care/Admitting Diagnosis ED Disposition    ED Disposition Condition [facility] Area: [facility] [[id number]]  Level of Care: Telemetry [5]  Admit to tele based on following criteria: Complex arrhythmia (Bradycardia/Tachycardia)  Diagnosis: PNA (pneumonia) [[id number]]  Admitting Physician: Phillips Grout [[id number]]  Attending Physician: Derrill Kay A [[id number]]  Estimated length of stay: past midnight tomorrow  Certification:: I certify this patient will need inpatient services for at least 2 midnights  PT Class (Do Not Modify): Inpatient [101]  PT Acc Code (Do Not Modify): Private [1]       Medical History Past Medical History:  Diagnosis Date  . Anemia due to chronic kidney disease   . Aspiration pneumonia ([place])   . Chronic kidney disease, stage 3 (moderate) (HCC)   . Hypertension   . Stroke [facility])     Allergies No Known Allergies  IV Location/Drains/Wounds Patient Lines/Drains/Airways Status   Active Line/Drains/Airways    Name:   Placement date:   Placement time:   Site:    Days:   Peripheral IV 11/16/17 Left Antecubital   11/16/17    1642    Antecubital   less than 1   Gastrostomy/Enterostomy Percutaneous endoscopic gastrostomy (PEG) LLQ   11/14/16    -    LLQ   367   Urethral Catheter John Hanes EMT Straight-tip 16 Fr.   06/20/17    1634    Straight-tip   149   Urethral Catheter Camryn Cogan, NT Straight-tip 16 Fr.   09/30/17    1750    Straight-tip   47   Tracheostomy Sh[place] 4 mm Laryngectomy;Uncuffed   07/26/17    1149    4 mm   113          Labs/Imaging Results for orders placed or performed during the hospital encounter of 11/16/17 (from the past 48 hour(s))  Comprehensive metabolic panel     Status: Abnormal   Collection Time: 11/16/17  4:42 PM  Result Value Ref Range   Sodium 141 135 - 145 mmol/L   Potassium 4.4 3.5 - 5.1 mmol/L   Chloride 97 (L) 98 - 111 mmol/L   CO2 32 22 - 32 mmol/L   Glucose, Bld 93 70 - 99 mg/dL   BUN 38 (H) 8 - 23 mg/dL   Creatinine, Ser 0.94 0.61 - 1.24 mg/dL   Calcium 10.3 8.9 - 10.3 mg/dL   Total Protein 8.9 (H) 6.5 - 8.1  g/dL   Albumin 3.5 3.5 - 5.0 g/dL   AST 24 15 - 41 U/L   ALT 22 0 - 44 U/L   Alkaline Phosphatase 107 38 - 126 U/L   Total Bilirubin 0.3 0.3 - 1.2 mg/dL   GFR calc non Af Amer >60 >60 mL/min   GFR calc Af Amer >60 >60 mL/min    Comment: (NOTE) The eGFR has been calculated using the CKD EPI equation. This calculation has not been validated in all clinical situations. eGFR's persistently <60 mL/min signify possible Chronic Kidney Disease.    Anion gap 12 5 - 15    Comment: Performed at [facility], [place] [address], [place], [place] [address]  Lipase, blood     Status: None   Collection Time: 11/16/17  4:42 PM  Result Value Ref Range   Lipase 22 11 - 51 U/L    Comment: Performed at [facility], [place] [address], [place], [place] [address]  CBC with Differential     Status: Abnormal   Collection Time: 11/16/17  4:42 PM  Result Value Ref Range   WBC 14.1  (H) 4.0 - 10.5 K/uL   RBC 5.61 4.22 - 5.81 MIL/uL   Hemoglobin 17.1 (H) 13.0 - 17.0 g/dL   HCT 53.8 (H) 39.0 - 52.0 %   MCV 95.9 78.0 - 100.0 fL   MCH 30.5 26.0 - 34.0 pg   MCHC 31.8 30.0 - 36.0 g/dL   RDW 15.1 11.5 - 15.5 %   Platelets 323 150 - 400 K/uL   Neutrophils Relative % 73 %   Neutro Abs 10.4 (H) 1.7 - 7.7 K/uL   Lymphocytes Relative 13 %   Lymphs Abs 1.8 0.7 - 4.0 K/uL   Monocytes Relative 12 %   Monocytes Absolute 1.6 (H) 0.1 - 1.0 K/uL   Eosinophils Relative 2 %   Eosinophils Absolute 0.2 0.0 - 0.7 K/uL   Basophils Relative 0 %   Basophils Absolute 0.0 0.0 - 0.1 K/uL    Comment: Performed at [facility], [place] [address] [place], [place] [address]  I-stat troponin, ED     Status: None   Collection Time: 11/16/17  4:48 PM  Result Value Ref Range   Troponin i, poc 0.02 0.00 - 0.08 ng/mL   Comment 3            Comment: Due to the release kinetics of cTnI, a negative result within the first hours of the onset of symptoms does not rule out myocardial infarction with certainty. If myocardial infarction is still suspected, repeat the test at appropriate intervals.   I-Stat CG4 Lactic Acid, ED     Status: None   Collection Time: 11/16/17  4:49 PM  Result Value Ref Range   Lactic Acid, Venous 1.46 0.5 - 1.9 mmol/L   Dg Chest 2 View  Result Date: 11/16/2017 CLINICAL DATA:  Spitting up brown mucus. EXAM: CHEST - 2 VIEW COMPARISON:  09/30/2017. FINDINGS: Heart size upper limits normal. Diffuse BILATERAL pulmonary opacities, lower lobe predominant; concern for pneumonia. Tracheostomy in good position. Surgical clips lung LEFT apex. No osseous findings. IMPRESSION: BILATERAL pulmonary opacities represent worsening aeration, concern for pneumonia raised. Tracheostomy good position. Unchanged heart size. Electronically Signed   By: Staci Righter M.D.   On: 11/16/2017 18:02   Dg Abd 2 Views  Result Date: 11/16/2017 CLINICAL DATA:  Abdominal pain.  Vomiting.  EXAM: ABDOMEN - 2 VIEW COMPARISON:  None. FINDINGS: There is  no visible bowel obstruction or free air. However there is massive stool burden in the rectal vault, as well as the RIGHT colon and transverse colon consistent with extensive constipation. Surgical clips mid abdomen. No radiopaque calculi are observed. Vascular calcification is noted. There is degenerative change both hips. IMPRESSION: Extensive constipation.  No acute findings. Electronically Signed   By: Staci Righter M.D.   On: 11/16/2017 18:03    Pending Labs Unresulted Labs (From admission, onward)   Start     Ordered   11/23/17 0500  Creatinine, serum  (enoxaparin (LOVENOX)    CrCl >/= 30 ml/min)  Weekly,   R    Comments:  while on enoxaparin therapy    11/16/17 1854   11/17/17 9381  Basic metabolic panel  Tomorrow morning,   R     11/16/17 1854   11/17/17 0500  CBC WITH DIFFERENTIAL  Tomorrow morning,   R     11/16/17 1854   11/16/17 1852  Culture, blood (routine x 2) Call MD if unable to obtain prior to antibiotics being given  BLOOD CULTURE X 2,   R    Comments:  If blood cultures drawn in Emergency Department - Do not draw and cancel order    11/16/17 1854   11/16/17 1852  Culture, sputum-assessment  Once,   R     11/16/17 1854   11/16/17 1852  Gram stain  Once,   R     11/16/17 1854   11/16/17 1852  Strep pneumoniae urinary antigen  Once,   R     11/16/17 1854   11/16/17 1852  CBC  (enoxaparin (LOVENOX)    CrCl >/= 30 ml/min)  Once,   R    Comments:  Baseline for enoxaparin therapy IF NOT ALREADY DRAWN.  Notify MD if PLT < 100 K.    11/16/17 1854   11/16/17 1852  Creatinine, serum  (enoxaparin (LOVENOX)    CrCl >/= 30 ml/min)  Once,   R    Comments:  Baseline for enoxaparin therapy IF NOT ALREADY DRAWN.    11/16/17 1854      Vitals/Pain Today's Vitals   11/16/17 1519 11/16/17 1527 11/16/17 1712  BP:  (!) 115/100 (!) 160/84  Pulse:  (!) 110 (!) 120  Resp:  20 18  Temp:  98.9 F (37.2 C)   TempSrc:  Oral    SpO2:  97% 96%  PainSc: 0-No pain      Isolation Precautions No active isolations  Medications Medications  sodium chloride 0.9 % bolus 500 mL (500 mLs Intravenous New Bag/Given 11/16/17 1848)  gabapentin (NEURONTIN) capsule 100 mg (has no administration in time range)  clopidogrel (PLAVIX) tablet 75 mg (has no administration in time range)  ipratropium-albuterol (DUONEB) 0.5-2.5 (3) MG/3ML nebulizer solution 3 mL (has no administration in time range)  pravastatin (PRAVACHOL) tablet 20 mg (has no administration in time range)  thiamine tablet 100 mg (has no administration in time range)  acetaminophen (TYLENOL) tablet 1,000 mg (has no administration in time range)  cloNIDine (CATAPRES - Dosed in mg/24 hr) patch 0.1 mg (has no administration in time range)  atropine 1 % ophthalmic solution 2 drop (has no administration in time range)  montelukast (SINGULAIR) tablet 10 mg (has no administration in time range)  feeding supplement (PRO-STAT SUGAR FREE 64) liquid 30 mL (has no administration in time range)  MEDLINE mouth rinse (has no administration in time range)  famotidine (PEPCID) 40 MG/5ML suspension 40 mg (has no  administration in time range)  enoxaparin (LOVENOX) injection 40 mg (has no administration in time range)  0.9 %  sodium chloride infusion (has no administration in time range)  ondansetron (ZOFRAN) injection 4 mg (has no administration in time range)  piperacillin-tazobactam (ZOSYN) IVPB 3.375 g (has no administration in time range)  ondansetron (ZOFRAN) injection 4 mg (4 mg Intravenous Given 11/16/17 1645)  piperacillin-tazobactam (ZOSYN) IVPB 3.375 g (0 g Intravenous Stopped 11/16/17 1918)    Mobility non-ambulatory

## 2017-11-16 NOTE — Progress Notes (Signed)
Placed PT on medical air ATC as at home. Suction trach times 3- resulted in small, white, thin, frothy secretions. PT did vomit and small thin, brown vomit producedand small amount suctioned from trach on third cath pass. RT cleaned PT inner cannula and replaced drain sponge.

## 2017-11-16 NOTE — ED Notes (Signed)
Bed: WA08 Expected date:  Expected time:  Means of arrival:  Comments: 67 yo Emesis

## 2017-11-16 NOTE — ED Provider Notes (Signed)
Emergency Department Provider Note   I have reviewed the triage vital signs and the nursing notes.   HISTORY  Chief Complaint Emesis and Tracheostomy Tube Change   HPI Scott Rivera is a 67 y.o. 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  Family states the vomit was dark brown but denies any black or coffee-ground material.  No bright red bleeding.  They did see some vomit coming from the trach as well as the patient's mouth.  She is endorsing some worsening shortness of breath denies any abdominal pain or chest pain.  He had a normal bowel movement earlier today.  He is had multiple episodes of vomiting today.  No fevers or chills. No medications given PTA. No radiation of symptoms or modifying factors.    Past Medical History:  Diagnosis Date  . Anemia due to chronic kidney disease   . Aspiration pneumonia ([place])   . Chronic kidney disease, stage 3 (moderate) (HCC)   . Hypertension   . Stroke [facility])     Patient Active Problem List   Diagnosis Date Noted  . Aspiration pneumonia ([place]) 11/16/2017  . Nausea and vomiting 11/16/2017  . Hyperkalemia 06/26/2017  . Hematuria 06/26/2017  . Bronchitis/??? Pneumonia 03/30/2017  . S/P percutaneous endoscopic gastrostomy (PEG) tube placement ([place]) 03/30/2017  . Chronic anemia 03/30/2017  . Foley catheter in place on admission 03/30/2017  . UTI (urinary tract infection) 03/30/2017  . MDR Acinetobacter baumannii infection   . Gastrostomy tube obstruction ([place])   . PEG tube malfunction ([place])   . Acute respiratory failure with hypoxia ([place]) 03/02/2017  . Loculated pleural effusion   . Acute respiratory distress   . Fever   . Follow up   . HCAP (healthcare-associated pneumonia)   . Stage III pressure ulcer of sacral region ([place]) 12/17/2016  . Hypoxemia   . H/O tracheostomy   . Pneumothorax   . Pneumothorax, closed, traumatic, initial encounter  12/10/2016  . Sepsis ([place]) 12/09/2016  . Hypotension 12/09/2016  . Acute-on-chronic kidney injury ([place]) 12/09/2016  . Elevated troponin 12/09/2016  . Anemia due to chronic kidney disease 12/09/2016  . Compensated metabolic alkalosis 73/22/0254  . Pneumonia   . Goals of care, counseling/discussion   . Palliative care by specialist   . Respiratory failure ([place])   . Aspiration into airway   . Septic shock ([place])   . Dysphagia, post-stroke   . Benign essential HTN   . Tobacco abuse   . Tachypnea   . Hyperglycemia   . Hypernatremia   . Leukocytosis (leucocytosis)   . Acute blood loss anemia   . Chronic kidney disease   . Pressure injury of skin 10/20/2016  . Cerebellar infarct ([place]) 10/19/2016  . ARF (acute renal failure) ([place]) 10/19/2016  . Essential hypertension 10/19/2016  . Rhabdomyolysis 10/19/2016  . History of completed stroke 10/19/2016  . Bilateral chronic knee pain 01/07/2016  . History of adenomatous polyp of colon 07/28/2015  . Hyperlipidemia 07/28/2013  . Helicobacter pylori infection 09/21/2012    Past Surgical History:  Procedure Laterality Date  . IR GASTROSTOMY TUBE MOD SED  10/27/2016  . IR PATIENT EVAL TECH 0-60 MINS  01/05/2017  . IR PATIENT EVAL TECH 0-60 MINS  01/09/2017  . IR PATIENT EVAL TECH 0-60 MINS  03/03/2017  . IR PATIENT EVAL TECH 0-60 MINS  04/03/2017  . IR [place] TUBE PERCUT W/FLUORO  07/23/2017  .  KNEE SURGERY    . TRACHEOSTOMY TUBE PLACEMENT      Allergies Patient has no known allergies.  Family History  Problem Relation Age of Onset  . Diabetes Mellitus II Brother   . CAD Neg Hx   . Stroke Neg Hx     Social History Social History   Tobacco Use  . Smoking status: Former Research scientist (life sciences)  . Smokeless tobacco: Never Used  . Tobacco comment: Smoked heavily until the day of his stroke  Substance Use Topics  . Alcohol use: Yes    Comment: Drank heavily prior to stroke per sister  . Drug use: Not on file    Review of  Systems  Constitutional: No fever/chills Eyes: No visual changes. ENT: No sore throat. Cardiovascular: Denies chest pain. Respiratory: Positive shortness of breath. Gastrointestinal: No abdominal pain. Positive nausea and vomiting.  No diarrhea.  No constipation. Genitourinary: Negative for dysuria. Musculoskeletal: Negative for back pain. Skin: Negative for rash. Neurological: Negative for headaches, focal weakness or numbness.  10-point ROS otherwise negative.  ____________________________________________   PHYSICAL EXAM:  VITAL SIGNS: ED Triage Vitals  Enc Vitals Group     BP 11/16/17 1527 (!) 115/100     Pulse Rate 11/16/17 1527 (!) 110     Resp 11/16/17 1527 20     Temp 11/16/17 1527 98.9 F (37.2 C)     Temp Source 11/16/17 1527 Oral     SpO2 11/16/17 1527 97 %     Pain Score 11/16/17 1519 0   Constitutional: Alert and oriented. Well appearing and in no acute distress. Able to nod yes and no for history.  Eyes: Conjunctivae are normal. Head: Atraumatic. Nose: No congestion/rhinnorhea. Mouth/Throat: Mucous membranes are moist.  Oropharynx non-erythematous. Neck: No stridor. Well-appearing trach without bleeding. Mild clear secretions from trach at times but easily cleared.  Cardiovascular: Good peripheral circulation. Grossly normal heart sounds.   Respiratory: Normal respiratory effort.  No retractions. Lungs rhonchi at the bases bilaterally.  Gastrointestinal: Soft and nontender. No distention.  Musculoskeletal: No lower extremity tenderness nor edema. No gross deformities of extremities. Neurologic:  Normal speech and language. No gross focal neurologic deficits are appreciated.  Skin:  Skin is warm, dry and intact. No rash noted.  ____________________________________________   LABS (all labs ordered are listed, but only abnormal results are displayed)  Labs Reviewed  COMPREHENSIVE METABOLIC PANEL - Abnormal; Notable for the following components:       Result Value   Chloride 97 (*)    BUN 38 (*)    Total Protein 8.9 (*)    All other components within normal limits  CBC WITH DIFFERENTIAL/PLATELET - Abnormal; Notable for the following components:   WBC 14.1 (*)    Hemoglobin 17.1 (*)    HCT 53.8 (*)    Neutro Abs 10.4 (*)    Monocytes Absolute 1.6 (*)    All other components within normal limits  CULTURE, BLOOD (ROUTINE X 2)  CULTURE, BLOOD (ROUTINE X 2)  EXPECTORATED SPUTUM ASSESSMENT W REFEX TO RESP CULTURE  GRAM STAIN  LIPASE, BLOOD  STREP PNEUMONIAE URINARY ANTIGEN  BASIC METABOLIC PANEL  CBC WITH DIFFERENTIAL/PLATELET  I-STAT TROPONIN, ED  I-STAT CG4 LACTIC ACID, ED   ____________________________________________  RADIOLOGY  Dg Chest 2 View  Result Date: 11/16/2017 CLINICAL DATA:  Spitting up brown mucus. EXAM: CHEST - 2 VIEW COMPARISON:  09/30/2017. FINDINGS: Heart size upper limits normal. Diffuse BILATERAL pulmonary opacities, lower lobe predominant; concern for pneumonia. Tracheostomy in good position. Surgical  clips lung LEFT apex. No osseous findings. IMPRESSION: BILATERAL pulmonary opacities represent worsening aeration, concern for pneumonia raised. Tracheostomy good position. Unchanged heart size. Electronically Signed   By: Staci Righter M.D.   On: 11/16/2017 18:02   Dg Abd 2 Views  Result Date: 11/16/2017 CLINICAL DATA:  Abdominal pain.  Vomiting. EXAM: ABDOMEN - 2 VIEW COMPARISON:  None. FINDINGS: There is no visible bowel obstruction or free air. However there is massive stool burden in the rectal vault, as well as the RIGHT colon and transverse colon consistent with extensive constipation. Surgical clips mid abdomen. No radiopaque calculi are observed. Vascular calcification is noted. There is degenerative change both hips. IMPRESSION: Extensive constipation.  No acute findings. Electronically Signed   By: Staci Righter M.D.   On: 11/16/2017 18:03     ____________________________________________   PROCEDURES  Procedure(s) performed:   Procedures  CRITICAL CARE Performed by: Margette Fast Total critical care time: 35 minutes Critical care time was exclusive of separately billable procedures and treating other patients. Critical care was necessary to treat or prevent imminent or life-threatening deterioration. Critical care was time spent personally by me on the following activities: development of treatment plan with patient and/or surrogate as well as nursing, discussions with consultants, evaluation of patient's response to treatment, examination of patient, obtaining history from patient or surrogate, ordering and performing treatments and interventions, ordering and review of laboratory studies, ordering and review of radiographic studies, pulse oximetry and re-evaluation of patient's condition.  Nanda Quinton, MD Emergency Medicine  ____________________________________________   INITIAL IMPRESSION / ASSESSMENT AND PLAN / ED COURSE  Pertinent labs & imaging results that were available during my care of the patient were reviewed by me and considered in my medical decision making (see chart for details).  Patient with past history of stroke, G-tube, trach dependent presents with vomiting since yesterday.  Some vomit coming from trach during these episodes.  Patient does endorse some worsening shortness of breath and has tachycardia.  Of concern for possible aspiration pneumonia given his presentation.  No concern for upper GI bleeding.  The family has a towel at bedside showing the color of the vomit which is a light brown.  Respiratory therapy suction the patient's trach and he is in no acute respiratory distress currently on trach collar.   Patient with leukocytosis and CXR concerning for PNA at the bilateral bases. Patient with tachycardia. Normal lactate. No evidence of sepsis at this time. Will start Zosyn and admit for obs.    Discussed patient's case with Hospitalist, Dr. Shanon Brow to request admission. Patient and family (if present) updated with plan. Care transferred to Hospitalist service.  I reviewed all nursing notes, vitals, pertinent old records, EKGs, labs, imaging (as available).  ____________________________________________  FINAL CLINICAL IMPRESSION(S) / ED DIAGNOSES  Final diagnoses:  Vomiting  Aspiration pneumonia of both lungs due to vomit, unspecified part of lung (HCC)  Non-intractable vomiting with nausea, unspecified vomiting type     MEDICATIONS GIVEN DURING THIS VISIT:  Medications  clopidogrel (PLAVIX) tablet 75 mg (has no administration in time range)  ipratropium-albuterol (DUONEB) 0.5-2.5 (3) MG/3ML nebulizer solution 3 mL (has no administration in time range)  pravastatin (PRAVACHOL) tablet 20 mg (has no administration in time range)  thiamine tablet 100 mg (has no administration in time range)  acetaminophen (TYLENOL) tablet 1,000 mg (has no administration in time range)  cloNIDine (CATAPRES - Dosed in mg/24 hr) patch 0.1 mg (has no administration in time range)  atropine 1 %  ophthalmic solution 2 drop (has no administration in time range)  montelukast (SINGULAIR) tablet 10 mg (has no administration in time range)  feeding supplement (PRO-STAT SUGAR FREE 64) liquid 30 mL (has no administration in time range)  MEDLINE mouth rinse (has no administration in time range)  famotidine (PEPCID) 40 MG/5ML suspension 40 mg (has no administration in time range)  enoxaparin (LOVENOX) injection 40 mg (has no administration in time range)  0.9 %  sodium chloride infusion (has no administration in time range)  ondansetron (ZOFRAN) injection 4 mg (has no administration in time range)  piperacillin-tazobactam (ZOSYN) IVPB 3.375 g (has no administration in time range)  ondansetron (ZOFRAN) injection 4 mg (4 mg Intravenous Given 11/16/17 1645)  sodium chloride 0.9 % bolus 500 mL (0 mLs Intravenous  Stopped 11/16/17 1938)  piperacillin-tazobactam (ZOSYN) IVPB 3.375 g (0 g Intravenous Stopped 11/16/17 1918)    Note:  This document was prepared using Dragon voice recognition software and may include unintentional dictation errors.  Nanda Quinton, MD Emergency Medicine    Maimuna Leaman, [facility], MD 11/16/17 2021

## 2017-11-17 LAB — BASIC METABOLIC PANEL
Anion gap: 9 (ref 5–15)
BUN: 38 mg/dL — ABNORMAL HIGH (ref 8–23)
CO2: 32 mmol/L (ref 22–32)
Calcium: 9.9 mg/dL (ref 8.9–10.3)
Chloride: 100 mmol/L (ref 98–111)
Creatinine, Ser: 1.21 mg/dL (ref 0.61–1.24)
GFR calc Af Amer: >60 mL/min (ref >60)
GFR calc non Af Amer: >60 mL/min (ref >60)
Glucose, Bld: 88 mg/dL (ref 70–99)
Potassium: 5 mmol/L (ref 3.5–5.1)
Sodium: 141 mmol/L (ref 135–145)

## 2017-11-17 LAB — CBC WITH DIFFERENTIAL/PLATELET
Basophils Absolute: 0 10*3/uL (ref 0.0–0.1)
Basophils Relative: 0 %
Eosinophils Absolute: 0.2 10*3/uL (ref 0.0–0.7)
Eosinophils Relative: 2 %
HCT: 50.9 % (ref 39.0–52.0)
Hemoglobin: 15.8 g/dL (ref 13.0–17.0)
Lymphocytes Relative: 15 %
Lymphs Abs: 1.8 10*3/uL (ref 0.7–4.0)
MCH: 30.2 pg (ref 26.0–34.0)
MCHC: 31 g/dL (ref 30.0–36.0)
MCV: 97.1 fL (ref 78.0–100.0)
Monocytes Absolute: 1.6 10*3/uL — ABNORMAL HIGH (ref 0.1–1.0)
Monocytes Relative: 13 %
Neutro Abs: 8.6 10*3/uL — ABNORMAL HIGH (ref 1.7–7.7)
Neutrophils Relative %: 70 %
Platelets: 323 10*3/uL (ref 150–400)
RBC: 5.24 MIL/uL (ref 4.22–5.81)
RDW: 15.5 % (ref 11.5–15.5)
WBC: 12.2 10*3/uL — ABNORMAL HIGH (ref 4.0–10.5)

## 2017-11-17 NOTE — Progress Notes (Signed)
PROGRESS NOTE  Scott Rivera SJG:[id number] DOB: 07/29/1950 DOA: 11/16/2017 PCP: Jamesetta Orleans, PA-C  HPI/Recap of past 24 hours:  HPI: Scott Rivera is a 67 y.o. 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  He is on trach collar only does not require any supplemental oxygen at home.  Denies any fevers.  Patient currently at his baseline respiratory status but on chest x-ray has bilateral lower infiltrates.  Patient referred for admission for aspiration pneumonia.  Has been noted.abdominal pain.  Has not had any further vomiting today.  Subjective: Denies any complaints overnight  Constitutional: No fever/chills Eyes: No visual changes. ENT: No sore throat. Cardiovascular: Denies chest pain. Respiratory: Positive shortness of breath. Gastrointestinal: No abdominal pain. Positive nausea and vomiting.  No diarrhea.  No constipation. Genitourinary: Negative for dysuria. Musculoskeletal: Negative for back pain. Skin: Negative for rash. Neurological: Negative for headaches, focal weakness or numbness.     Assessment/Plan: Principal Problem:   Aspiration pneumonia ([place]) Active Problems:   History of completed stroke   Benign essential HTN   Chronic kidney disease   Aspiration into airway   Bilateral chronic knee pain   Anemia due to chronic kidney disease   H/O tracheostomy   S/P percutaneous endoscopic gastrostomy (PEG) tube placement (HCC)   Nausea and vomiting   DVT prophylaxis: Lovenox Code Status: Full Family Communication: SISTER at bedside Disposition Plan: 1 to 3 days Consults called: None Admission status:  IN PATIENT  Admission   Procedures:  NONE  Antimicrobials:  Zosyn   Objective: Vitals:   11/17/17 1338 11/17/17 1511  BP: (!) 146/73   Pulse: (!) 110 (!) 109  Resp: 18 18  Temp: 99.2 F (37.3 C)   SpO2: 92% 92%    Intake/Output Summary (Last 24 hours) at 11/17/2017 1958 Last data filed at 11/17/2017 1856 Gross per 24 hour  Intake 709.74 ml  Output 1000 ml  Net -290.26 ml   Filed Weights   11/16/17 2019  Weight: 70.4 kg (155 lb 3.3 oz)   Body mass index is 21.05 kg/m.  Exam:   Constitutional: Alert and oriented. Well appearing and in no acute distress. Able to nod yes and no for history.  Eyes: Conjunctivae are normal. Head: Atraumatic. Nose: No congestion/rhinnorhea. Mouth/Throat: Mucous membranes are moist.  Oropharynx non-erythematous. Neck: No stridor. Well-appearing trach without bleeding. Mild clear secretions from trach at times but easily cleared.  Cardiovascular: Good peripheral circulation. Grossly normal heart sounds.   Respiratory: Normal respiratory effort.  No retractions. Lungs rhonchi at the bases bilaterally.  Gastrointestinal: Soft and nontender. No distention.  Musculoskeletal: No lower extremity tenderness nor edema. No gross deformities of extremities. Neurologic:  Normal speech and language. No gross focal neurologic deficits are appreciated.  Skin:  Skin is warm, dry and intact. No rash noted.      Data Reviewed: CBC: Recent Labs  Lab 11/16/17 1642 11/17/17 0502  WBC 14.1* 12.2*  NEUTROABS 10.4* 8.6*  HGB 17.1* 15.8  HCT 53.8* 50.9  MCV 95.9 97.1  PLT 323 654   Basic Metabolic Panel: Recent Labs  Lab 11/16/17 1642 11/17/17 0502  NA 141 141  K 4.4 5.0  CL 97* 100  CO2 32 32  GLUCOSE 93  88  BUN 38* 38*  CREATININE 0.94 1.21  CALCIUM 10.3 9.9   GFR: Estimated Creatinine Clearance: 59 mL/min (by C-G formula based on SCr of 1.21 mg/dL). Liver Function Tests: Recent Labs  Lab 11/16/17 1642  AST 24  ALT 22  ALKPHOS 107   BILITOT 0.3  PROT 8.9*  ALBUMIN 3.5   Recent Labs  Lab 11/16/17 1642  LIPASE 22   No results for input(s): AMMONIA in the last 168 hours. Coagulation Profile: No results for input(s): INR, PROTIME in the last 168 hours. Cardiac Enzymes: No results for input(s): CKTOTAL, CKMB, CKMBINDEX, TROPONINI in the last 168 hours. BNP (last 3 results) No results for input(s): PROBNP in the last 8760 hours. HbA1C: No results for input(s): HGBA1C in the last 72 hours. CBG: No results for input(s): GLUCAP in the last 168 hours. Lipid Profile: No results for input(s): CHOL, HDL, LDLCALC, TRIG, CHOLHDL, LDLDIRECT in the last 72 hours. Thyroid Function Tests: No results for input(s): TSH, T4TOTAL, FREET4, T3FREE, THYROIDAB in the last 72 hours. Anemia Panel: No results for input(s): VITAMINB12, FOLATE, FERRITIN, TIBC, IRON, RETICCTPCT in the last 72 hours. Urine analysis:    Component Value Date/Time   COLORURINE YELLOW 09/30/2017 1800   APPEARANCEUR CLOUDY (A) 09/30/2017 1800   LABSPEC 1.015 09/30/2017 1800   PHURINE 6.0 09/30/2017 1800   GLUCOSEU NEGATIVE 09/30/2017 1800   HGBUR MODERATE (A) 09/30/2017 1800   BILIRUBINUR NEGATIVE 09/30/2017 1800   KETONESUR NEGATIVE 09/30/2017 1800   PROTEINUR 100 (A) 09/30/2017 1800   NITRITE NEGATIVE 09/30/2017 1800   LEUKOCYTESUR LARGE (A) 09/30/2017 1800   Sepsis Labs: @LABRCNTIP (procalcitonin:4,lacticidven:4)  ) Recent Results (from the past 240 hour(s))  Culture, blood (routine x 2) Call MD if unable to obtain prior to antibiotics being given     Status: None (Preliminary result)   Collection Time: 11/16/17  8:52 PM  Result Value Ref Range Status   Specimen Description   Final    BLOOD BLOOD RIGHT HAND Performed at [facility], [place] [address], [place], [place] [address]    Special Requests   Final    BOTTLES DRAWN AEROBIC ONLY Blood Culture results may not be optimal due to an inadequate volume of blood received in  culture bottles Performed at [facility] [address] [place], [place] [address]    Culture   Final    NO GROWTH < 24 HOURS Performed at [place] [address], [place], [place] [address]    Report Status PENDING  Incomplete  Culture, blood (routine x 2) Call MD if unable to obtain prior to antibiotics being given     Status: None (Preliminary result)   Collection Time: 11/16/17  8:52 PM  Result Value Ref Range Status   Specimen Description   Final    BLOOD BLOOD LEFT HAND Performed at [facility] [address] [place], [place] [address]    Special Requests   Final    BOTTLES DRAWN AEROBIC ONLY Blood Culture results may not be optimal due to an inadequate volume of blood received in culture bottles Performed at [facility] [address] [place], [place] [address]    Culture   Final    NO GROWTH < 24 HOURS Performed at [facility] [address], [place],  [address]    Report Status PENDING  Incomplete      Studies: No results found.  Scheduled Meds: . acetaminophen  1,000 mg Per Tube TID  . cloNIDine  0.1 mg Transdermal Weekly  . clopidogrel  75 mg Per Tube Daily  . enoxaparin (LOVENOX) injection  40 mg Subcutaneous Q24H  . feeding supplement (PRO-STAT SUGAR FREE 64)  30 mL Oral QHS  . gabapentin  100 mg Per Tube Q8H  . mouth rinse  10 mL Mouth Rinse QID  . montelukast  10 mg Oral QHS  . pravastatin  20 mg Per Tube q1800  . ranitidine  150 mg Per Tube Daily  . thiamine  100 mg Per Tube Daily    Continuous Infusions: . sodium chloride Stopped (11/17/17 0848)  . piperacillin-tazobactam (ZOSYN)  IV 3.375 g (11/17/17 1700)     LOS: 1 day     Cristal Deer, MD [facility]  To reach me or the doctor on call, go to: [email or web address] [facility]  11/17/2017, 7:58 PM

## 2017-11-17 NOTE — Progress Notes (Signed)
Patient's 02 saturations 75% on 28% ATC. Increased to 100% and suctioned, moderate thick white secretions. Decreased back down to 60%, 02 saturations 91%. RT will continue to monitor.

## 2017-11-17 NOTE — Progress Notes (Signed)
Called to room for patient who has vomited and has [place] secretions coming from trach with spontaneous cough and suctioning.  Trach site cleaned, trach tie replaced and new drain sponge applied.  Patient requiring 60% fio2 to maintain saturations.

## 2017-11-18 ENCOUNTER — Inpatient Hospital Stay (HOSPITAL_COMMUNITY): Payer: Medicare HMO

## 2017-11-18 LAB — MRSA PCR SCREENING: MRSA by PCR: NEGATIVE

## 2017-11-18 LAB — BASIC METABOLIC PANEL
Anion gap: 12 (ref 5–15)
BUN: 38 mg/dL — ABNORMAL HIGH (ref 8–23)
CO2: 30 mmol/L (ref 22–32)
Calcium: 10 mg/dL (ref 8.9–10.3)
Chloride: 104 mmol/L (ref 98–111)
Creatinine, Ser: 1.43 mg/dL — ABNORMAL HIGH (ref 0.61–1.24)
GFR calc Af Amer: 57 mL/min — ABNORMAL LOW (ref >60)
GFR calc non Af Amer: 49 mL/min — ABNORMAL LOW (ref >60)
Glucose, Bld: 96 mg/dL (ref 70–99)
Potassium: 4.8 mmol/L (ref 3.5–5.1)
Sodium: 146 mmol/L — ABNORMAL HIGH (ref 135–145)

## 2017-11-18 LAB — CBC WITH DIFFERENTIAL/PLATELET
Band Neutrophils: 7 %
Basophils Absolute: 0 10*3/uL (ref 0.0–0.1)
Basophils Relative: 0 %
Eosinophils Absolute: 0 10*3/uL (ref 0.0–0.7)
Eosinophils Relative: 0 %
HCT: 49.7 % (ref 39.0–52.0)
Hemoglobin: 15.3 g/dL (ref 13.0–17.0)
Lymphocytes Relative: 10 %
Lymphs Abs: 3 10*3/uL (ref 0.7–4.0)
MCH: 30.1 pg (ref 26.0–34.0)
MCHC: 30.8 g/dL (ref 30.0–36.0)
MCV: 97.8 fL (ref 78.0–100.0)
Monocytes Absolute: 2.1 10*3/uL — ABNORMAL HIGH (ref 0.1–1.0)
Monocytes Relative: 7 %
Neutro Abs: 25.2 10*3/uL — ABNORMAL HIGH (ref 1.7–7.7)
Neutrophils Relative %: 76 %
Platelets: 296 10*3/uL (ref 150–400)
RBC: 5.08 MIL/uL (ref 4.22–5.81)
RDW: 15.3 % (ref 11.5–15.5)
WBC: 30.3 10*3/uL — ABNORMAL HIGH (ref 4.0–10.5)

## 2017-11-18 MED ORDER — LACTATED RINGERS IV SOLN
INTRAVENOUS | Status: DC
  Administered 2017-11-18: 50 mL/h via INTRAVENOUS

## 2017-11-18 MED ORDER — SODIUM CHLORIDE 0.9 % IV SOLN
INTRAVENOUS | Status: DC | PRN
  Administered 2017-11-18 – 2017-11-22 (×4): via INTRAVENOUS

## 2017-11-18 MED ORDER — MAGNESIUM HYDROXIDE 400 MG/5ML PO SUSP
960.0000 mL | Freq: Once | ORAL | Status: AC
  Administered 2017-11-18: 960 mL via RECTAL
  Filled 2017-11-18: qty 473

## 2017-11-18 MED ORDER — POLYETHYLENE GLYCOL 3350 17 G PO PACK
17.0000 g | PACK | Freq: Two times a day (BID) | ORAL | Status: DC
  Administered 2017-11-18 – 2017-11-22 (×6): 17 g
  Filled 2017-11-18 (×7): qty 1

## 2017-11-18 MED ORDER — IPRATROPIUM-ALBUTEROL 0.5-2.5 (3) MG/3ML IN SOLN
3.0000 mL | Freq: Four times a day (QID) | RESPIRATORY_TRACT | Status: DC
  Administered 2017-11-18 – 2017-11-22 (×15): 3 mL via RESPIRATORY_TRACT
  Filled 2017-11-18 (×14): qty 3
  Filled 2017-11-18: qty 9
  Filled 2017-11-18 (×2): qty 3

## 2017-11-18 NOTE — Progress Notes (Signed)
Patient has had lots of mucus orally and via the trach needing lots of suctioning, but no vomit. Patient's sister came in this morning and requested patient be transferred to be closely monitored, Dr. Posey Pronto at the bedside discussing the plan of care with patient's sister and she verbalized understanding and agree.

## 2017-11-18 NOTE — Progress Notes (Signed)
[facility] Progress Note  Patient: Scott Rivera PNT:[id number]   PCP: Jamesetta Orleans, PA-C DOB: 1951/04/06   DOA: 11/16/2017   DOS: 11/18/2017   Date of Service: the patient was seen and examined on 11/18/2017  Subjective: Patient is nonverbal at his baseline, had an episode of vomiting last night.  No fever no chills.  Does not use oxygen at his baseline more than 28% FiO2.  Brief hospital course: Pt. with PMH of CVA with aphasia, PEG tube placement, tracheostomy placement, deconditioning with protein calorie malnutrition, recurrent aspiration pneumonia; admitted on 11/16/2017, presented with complaint of cough and shortness of breath, was found to have aspiration pneumonia secondary to vomiting from constipation. Currently further plan is continue current regimen.  Assessment and Plan: 1.  Acute hypoxic respiratory failure. Aspiration pneumonia. FiO2 28% baseline, currently needing 60% FiO2. Tachycardic and tachypneic therefore we will transfer him to stepdown unit. Patient has problem with significant secretion. Continue chest PT with bed. Repeat chest x-ray shows left lung infiltrate. Likely patient is aspirating from vomiting.  Will hold tube feeding for at least next 24 hours. Continue IV Zosyn, follow-up on culture. Request RT to perform a deep respiratory culture.  2.  Nausea and vomiting. Severe constipation. Primary reason for patient's presentation was nausea and vomiting, x-ray abdomen shows severe constipation with stool in rectal vault. Responding well to one-time dose of smog enema. Continue Senokot and MiraLAX. Stool still does not appear to have cleared completely and therefore will hold off on tube feedings for the next 24 hours.  3.  CVA. At baseline aphasia and residual weakness. Dependent on PEG and trach. No acute deficit. Continue aspirin Plavix and statin.  4.  Essential hypertension. Patient is on clonidine. Monitor for hypotension while patient being  treated for sepsis.  5.  Acute kidney injury. Renal function mildly worsened from baseline likely acute Patient was given IV hydration currently on hold with concern for worsening respiratory status. Monitor While the patient is receiving IV fluids gently  Diet: NPO, hold tube feeding DVT Prophylaxis: subcutaneous Heparin  Advance goals of care discussion: full code  Family Communication: family was present at bedside, at the time of interview. The pt provided permission to discuss medical plan with the family. Opportunity was given to ask question and all questions were answered satisfactorily.   Disposition:  Discharge to be determined. Family initially wanted the patient to be transferred to [facility], currently the [facility] does not have any beds for to stepdown.  Family requested the patient be transferred to [facility] stepdown and that is in process.  Consultants: None Procedures: none  Antibiotics: Anti-infectives (From admission, onward)   Start     Dose/Rate Route Frequency Ordered Stop   11/17/17 0100  piperacillin-tazobactam (ZOSYN) IVPB 3.375 g     3.375 g 12.5 mL/hr over 240 Minutes Intravenous Every 8 hours 11/16/17 1909     11/16/17 1830  piperacillin-tazobactam (ZOSYN) IVPB 3.375 g     3.375 g 100 mL/hr over 30 Minutes Intravenous  Once 11/16/17 1828 11/16/17 1918   11/16/17 1815  piperacillin-tazobactam (ZOSYN) IVPB 4.5 g  Status:  Discontinued     4.5 g 200 mL/hr over 30 Minutes Intravenous  Once 11/16/17 1813 11/16/17 1826       Objective: Physical Exam: Vitals:   11/18/17 0758 11/18/17 1107 11/18/17 1313 11/18/17 1507  BP:   119/69   Pulse: (!) 111  (!) 101 (!) 103  Resp:   18 18  Temp:   98.1 F (36.7 C)   TempSrc:   Oral   SpO2: 95% 93% 99% 97%  Weight:   71.7 kg (158 lb 1.1 oz)   Height:   6' (1.829 m)     Intake/Output Summary (Last 24 hours) at 11/18/2017 1542 Last data filed at 11/18/2017 1500 Gross per 24 hour  Intake  867.12 ml  Output 650 ml  Net 217.12 ml   Filed Weights   11/16/17 2019 11/18/17 1313  Weight: 70.4 kg (155 lb 3.3 oz) 71.7 kg (158 lb 1.1 oz)   General: Alert, Awake and difficult to assess to assess orientation  Appear in moderate distress, affect flat Eyes: PERRL, Conjunctiva normal ENT: Oral Mucosa clear dry. Neck: difficult to assess JVD, no Abnormal Mass Or lumps Cardiovascular: S1 and S2 Present, no Murmur, Peripheral Pulses Present Respiratory: increased respiratory effort, Bilateral Air entry equal and Decreased, no use of accessory muscle, bilateral  Crackles, no wheezes Abdomen: Bowel Sound present, Soft and jno tenderness, no hernia Skin: no redness, no Rash, no induration Extremities: no Pedal edema, no calf tenderness Neurologic: Grossly no focal neuro deficit. Bilaterally Equal motor strength, chronic aphasia   Data Reviewed: CBC: Recent Labs  Lab 11/16/17 1642 11/17/17 0502 11/18/17 0448  WBC 14.1* 12.2* 30.3*  NEUTROABS 10.4* 8.6* 25.2*  HGB 17.1* 15.8 15.3  HCT 53.8* 50.9 49.7  MCV 95.9 97.1 97.8  PLT 323 323 229   Basic Metabolic Panel: Recent Labs  Lab 11/16/17 1642 11/17/17 0502 11/18/17 0448  NA 141 141 146*  K 4.4 5.0 4.8  CL 97* 100 104  CO2 32 32 30  GLUCOSE 93 88 96  BUN 38* 38* 38*  CREATININE 0.94 1.21 1.43*  CALCIUM 10.3 9.9 10.0    Liver Function Tests: Recent Labs  Lab 11/16/17 1642  AST 24  ALT 22  ALKPHOS 107  BILITOT 0.3  PROT 8.9*  ALBUMIN 3.5   Recent Labs  Lab 11/16/17 1642  LIPASE 22   No results for input(s): AMMONIA in the last 168 hours. Coagulation Profile: No results for input(s): INR, PROTIME in the last 168 hours. Cardiac Enzymes: No results for input(s): CKTOTAL, CKMB, CKMBINDEX, TROPONINI in the last 168 hours. BNP (last 3 results) No results for input(s): PROBNP in the last 8760 hours. CBG: No results for input(s): GLUCAP in the last 168 hours. Studies: Dg Chest Port 1 View  Result Date:  11/18/2017 CLINICAL DATA:  Follow-up tracheostomy tube EXAM: PORTABLE CHEST 1 VIEW COMPARISON:  09/30/2017 FINDINGS: Cardiac shadow is stable. Tracheostomy tube is again noted. The right lung is clear. Patchy infiltrate is noted in the left mid lung. No bony abnormality is seen. IMPRESSION: New patchy left lung infiltrate. Electronically Signed   By: Inez Catalina M.D.   On: 11/18/2017 15:35   Dg Abd Portable 1v  Result Date: 11/18/2017 CLINICAL DATA:  Constipation EXAM: PORTABLE ABDOMEN - 1 VIEW COMPARISON:  11/16/2017 FINDINGS: Scattered large and small bowel gas is noted. Constipation is again identified and stable. No free air is seen. Degenerative changes of the lumbar spine and hip joints are noted. IMPRESSION: Changes of constipation stable from the prior study. Electronically Signed   By: Inez Catalina M.D.   On: 11/18/2017 15:36    Scheduled Meds: . acetaminophen  1,000 mg Per Tube TID  . cloNIDine  0.1 mg Transdermal Weekly  . clopidogrel  75 mg Per Tube Daily  . enoxaparin (LOVENOX) injection  40 mg Subcutaneous Q24H  .  feeding supplement (PRO-STAT SUGAR FREE 64)  30 mL Oral QHS  . gabapentin  100 mg Per Tube Q8H  . mouth rinse  10 mL Mouth Rinse QID  . polyethylene glycol  17 g Per Tube BID  . ranitidine  150 mg Per Tube Daily   Continuous Infusions: . sodium chloride 20 mL/hr at 11/18/17 1230  . piperacillin-tazobactam (ZOSYN)  IV 3.375 g (11/18/17 1015)   PRN Meds: sodium chloride, atropine, ipratropium-albuterol, ondansetron (ZOFRAN) IV  Time spent: The patient is critically ill with multiple organ systems failure and requires high complexity decision making for assessment and support, frequent evaluation and titration of therapies. Critical Care Time devoted to patient care services described in this note is 35 minutes   Author: Berle Mull, MD [facility] Pager: [phone] 11/18/2017 3:42 PM  If 7PM-7AM, please contact night-coverage at [email or web address], password [facility]

## 2017-11-18 NOTE — Progress Notes (Signed)
Initial Nutrition Assessment  INTERVENTION:   Once TF is able to resume: Provide Jevity 1.2 @ 40 ml/hr, advance to goal rate of 60 ml/hr if tolerating. Free water flushes of 100 ml every 3 hours Provides 1728 kcal, 79g protein and 1962 ml H2O.  NUTRITION DIAGNOSIS:   Inadequate oral intake related to nausea, vomiting as evidenced by per patient/family report.  GOAL:   Patient will meet greater than or equal to 90% of their needs  MONITOR:   Labs, Weight trends, I & O's, TF tolerance  REASON FOR ASSESSMENT:   Malnutrition Screening Tool    ASSESSMENT:   67 y.o. 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   Patient with respiratory distress so transferred to ICU from [place] today. Will attempt NFPE at follow-up when patient more stable.  Pt NPO. Has PEG tube. Admitted with aspiration pneumonia. Has been vomiting PO and through trach. Once TF able to be resumed, recommendations above. Per chart review, pt continues to receive continuous feeds of Jevity 1.2 @ 60 ml/hr w/ 100 ml free water flushes every 3 hours.   Per weight records, weight has increased since April and May 2019.    Medications: Thiamine tablet daily Labs reviewed: Elevated Na   NUTRITION - FOCUSED PHYSICAL EXAM:  Unable to perform, pt transferring to ICU d/t respiratory distress  Diet Order:   Diet Order           Diet NPO time specified  Diet effective now          EDUCATION NEEDS:   Not appropriate for education at this time  Skin:  Skin Assessment: Reviewed RN Assessment  Last BM:  8/3  Height:   Ht Readings from Last 1 Encounters:  11/17/17 6' (1.829 m)    Weight:   Wt Readings from Last 1 Encounters:   11/16/17 155 lb 3.3 oz (70.4 kg)    Ideal Body Weight:  80.9 kg  BMI:  Body mass index is 21.05 kg/m.  Estimated Nutritional Needs:   Kcal:  7408-1448  Protein:  95-105g  Fluid:  2L/day  Clayton Bibles, MS, RD, LDN [employer] Pager: [phone] After Hours Pager: [phone]

## 2017-11-19 DIAGNOSIS — E440 Moderate protein-calorie malnutrition: Secondary | ICD-10-CM

## 2017-11-19 LAB — COMPREHENSIVE METABOLIC PANEL
ALT: 15 U/L (ref 0–44)
AST: 18 U/L (ref 15–41)
Albumin: 2.8 g/dL — ABNORMAL LOW (ref 3.5–5.0)
Alkaline Phosphatase: 75 U/L (ref 38–126)
Anion gap: 9 (ref 5–15)
BUN: 34 mg/dL — ABNORMAL HIGH (ref 8–23)
CO2: 34 mmol/L — ABNORMAL HIGH (ref 22–32)
Calcium: 9.9 mg/dL (ref 8.9–10.3)
Chloride: 104 mmol/L (ref 98–111)
Creatinine, Ser: 1.1 mg/dL (ref 0.61–1.24)
GFR calc Af Amer: >60 mL/min (ref >60)
GFR calc non Af Amer: >60 mL/min (ref >60)
Glucose, Bld: 65 mg/dL — ABNORMAL LOW (ref 70–99)
Potassium: 4.8 mmol/L (ref 3.5–5.1)
Sodium: 147 mmol/L — ABNORMAL HIGH (ref 135–145)
Total Bilirubin: 1.1 mg/dL (ref 0.3–1.2)
Total Protein: 7.9 g/dL (ref 6.5–8.1)

## 2017-11-19 LAB — GLUCOSE, CAPILLARY
Glucose-Capillary: 62 mg/dL — ABNORMAL LOW (ref 70–99)
Glucose-Capillary: 142 mg/dL — ABNORMAL HIGH (ref 70–99)
Glucose-Capillary: 97 mg/dL (ref 70–99)
Glucose-Capillary: 67 mg/dL — ABNORMAL LOW (ref 70–99)
Glucose-Capillary: 104 mg/dL — ABNORMAL HIGH (ref 70–99)
Glucose-Capillary: 94 mg/dL (ref 70–99)
Glucose-Capillary: 94 mg/dL (ref 70–99)

## 2017-11-19 LAB — MAGNESIUM: Magnesium: 2.2 mg/dL (ref 1.7–2.4)

## 2017-11-19 LAB — CBC WITH DIFFERENTIAL/PLATELET
Basophils Absolute: 0 10*3/uL (ref 0.0–0.1)
Basophils Relative: 0 %
Eosinophils Absolute: 0.2 10*3/uL (ref 0.0–0.7)
Eosinophils Relative: 1 %
HCT: 52 % (ref 39.0–52.0)
Hemoglobin: 15.9 g/dL (ref 13.0–17.0)
Lymphocytes Relative: 9 %
Lymphs Abs: 1.2 10*3/uL (ref 0.7–4.0)
MCH: 29.7 pg (ref 26.0–34.0)
MCHC: 30.6 g/dL (ref 30.0–36.0)
MCV: 97.2 fL (ref 78.0–100.0)
Monocytes Absolute: 1.9 10*3/uL (ref 0.1–1.0)
Monocytes Relative: 13 %
Neutro Abs: 11.1 10*3/uL (ref 1.7–7.7)
Neutrophils Relative %: 77 %
Platelets: 186 10*3/uL (ref 150–400)
RBC: 5.35 MIL/uL (ref 4.22–5.81)
RDW: 15.4 % (ref 11.5–15.5)
WBC: 14.4 10*3/uL — ABNORMAL HIGH (ref 4.0–10.5)

## 2017-11-19 MED ORDER — DEXTROSE-NACL 5-0.45 % IV SOLN
INTRAVENOUS | Status: AC
  Administered 2017-11-19: 16:00:00 via INTRAVENOUS

## 2017-11-19 MED ORDER — GLYCOPYRROLATE 0.2 MG/ML IJ SOLN: 0.2000 mg | INTRAMUSCULAR | Status: DC | PRN

## 2017-11-19 MED ORDER — RANITIDINE HCL 150 MG/10ML PO SYRP
150.0000 mg | ORAL_SOLUTION | Freq: Every day | ORAL | Status: DC
  Administered 2017-11-19 – 2017-11-22 (×3): 150 mg
  Filled 2017-11-19 (×4): qty 10

## 2017-11-19 MED ORDER — DEXTROSE 50 % IV SOLN
1.0000 | Freq: Once | INTRAVENOUS | Status: AC
  Administered 2017-11-19: 50 mL via INTRAVENOUS
  Filled 2017-11-19: qty 50

## 2017-11-19 MED ORDER — DEXTROSE 50 % IV SOLN
1.0000 | INTRAVENOUS | Status: DC | PRN
  Administered 2017-11-19: 50 mL via INTRAVENOUS
  Filled 2017-11-19: qty 50

## 2017-11-19 MED ORDER — GLYCOPYRROLATE 1 MG PO TABS
1.0000 mg | ORAL_TABLET | Freq: Two times a day (BID) | ORAL | Status: DC
  Administered 2017-11-19 – 2017-11-22 (×5): 1 mg
  Filled 2017-11-19 (×6): qty 1

## 2017-11-19 MED ORDER — GLYCOPYRROLATE 0.2 MG/ML IJ SOLN
0.1000 mg | Freq: Two times a day (BID) | INTRAMUSCULAR | Status: DC
  Administered 2017-11-19: 0.1 mg via INTRAVENOUS
  Filled 2017-11-19: qty 1

## 2017-11-19 MED ORDER — MAGNESIUM SULFATE 2 GM/50ML IV SOLN
2.0000 g | Freq: Once | INTRAVENOUS | Status: DC
  Administered 2017-11-19: 2 g via INTRAVENOUS
  Filled 2017-11-19: qty 50

## 2017-11-19 MED ORDER — ASPIRIN 81 MG PO CHEW
81.0000 mg | CHEWABLE_TABLET | Freq: Every day | ORAL | Status: DC
Start: 2017-11-19 — End: 2017-11-22
  Administered 2017-11-19 – 2017-11-22 (×3): 81 mg
  Filled 2017-11-19 (×3): qty 1

## 2017-11-19 MED ORDER — FAMOTIDINE 40 MG/5ML PO SUSR: 40.0000 mg | Freq: Every day | ORAL | Status: DC

## 2017-11-19 MED ORDER — VITAMIN B-1 100 MG PO TABS
100.0000 mg | ORAL_TABLET | Freq: Every day | ORAL | Status: DC
  Administered 2017-11-19 – 2017-11-22 (×3): 100 mg
  Filled 2017-11-19 (×3): qty 1

## 2017-11-19 MED ORDER — JEVITY 1.2 CAL PO LIQD
1000.0000 mL | ORAL | Status: DC
  Administered 2017-11-19 – 2017-11-22 (×3): 1000 mL

## 2017-11-19 NOTE — Progress Notes (Signed)
Cleaned inner cannula, replaced drain sponge, now on 35% ATC, trach and tie secure at this time. All emergency equipment in room at this time.

## 2017-11-19 NOTE — Progress Notes (Signed)
[facility] Progress Note  Patient: Scott Rivera OFB:[id number]   PCP: Jamesetta Orleans, PA-C DOB: 1951-02-07   DOA: 11/16/2017   DOS: 11/19/2017   Date of Service: the patient was seen and examined on 11/19/2017  Subjective: No acute complaint no nausea no vomiting.  Improvement in oxygenation.  Continues to have a bowel movement  Brief hospital course: Pt. with PMH of CVA with aphasia, PEG tube placement, tracheostomy placement, deconditioning with protein calorie malnutrition, recurrent aspiration pneumonia; admitted on 11/16/2017, presented with complaint of cough and shortness of breath, was found to have aspiration pneumonia secondary to vomiting from constipation. Currently further plan is continue current regimen.  Assessment and Plan: 1.  Acute hypoxic respiratory failure. Aspiration pneumonia. FiO2 28% baseline, currently needing 60% FiO2. Tachycardic and tachypneic therefore we will transfer him to stepdown unit. Patient has problem with significant secretion. Significant improvement with chest PT with bed. Repeat chest x-ray shows left lung infiltrate. Likely patient is aspirating from vomiting.  Will hold tube feeding for at least next 24 hours. Continue IV Zosyn, follow-up on culture. Follow-up on deep respiratory culture. Will add Robinul scheduled.  Continue PRN atropine for secretion.  2.  Nausea and vomiting. Severe constipation. Primary reason for patient's presentation was nausea and vomiting, x-ray abdomen shows severe constipation with stool in rectal vault. Responding well to one-time dose of smog enema. Continue Senokot and MiraLAX. Will slowly advance tube feeding.  Holding on free water.  3.  CVA. At baseline aphasia and residual weakness. Dependent on PEG and trach. No acute deficit. Continue aspirin Plavix and statin.  4.  Essential hypertension. Patient is on clonidine. Monitor for hypotension while patient being treated for sepsis.  5.  Acute  kidney injury. Renal function mildly worsened from baseline likely acute Patient was given IV hydration currently on hold with concern for worsening respiratory status. Monitor While the patient is receiving IV fluids gently  6.  Hypoglycemia. Likely due to poor p.o. intake.  Will start on D5 for short-term until tube feeding rate is increased.  Diet: NPO, resume tube feeding DVT Prophylaxis: subcutaneous Heparin  Advance goals of care discussion: full code  Family Communication: family was present at bedside, at the time of interview. The pt provided permission to discuss medical plan with the family. Opportunity was given to ask question and all questions were answered satisfactorily.   Disposition:  Discharge to be determined. Family initially wanted the patient to be transferred to [facility], currently the [facility] does not have any beds for to stepdown.  Family requested the patient be transferred to [facility] stepdown and that is in process.  Consultants: None Procedures: none  Antibiotics: Anti-infectives (From admission, onward)   Start     Dose/Rate Route Frequency Ordered Stop   11/17/17 0100  piperacillin-tazobactam (ZOSYN) IVPB 3.375 g     3.375 g 12.5 mL/hr over 240 Minutes Intravenous Every 8 hours 11/16/17 1909     11/16/17 1830  piperacillin-tazobactam (ZOSYN) IVPB 3.375 g     3.375 g 100 mL/hr over 30 Minutes Intravenous  Once 11/16/17 1828 11/16/17 1918   11/16/17 1815  piperacillin-tazobactam (ZOSYN) IVPB 4.5 g  Status:  Discontinued     4.5 g 200 mL/hr over 30 Minutes Intravenous  Once 11/16/17 1813 11/16/17 1826       Objective: Physical Exam: Vitals:   11/19/17 1559 11/19/17 1600 11/19/17 1619 11/19/17 1700  BP:  117/84  126/60  Pulse:  89  88  Resp:  15  15  Temp: 98.4 F (36.9 C)     TempSrc: Oral     SpO2:  98% 96% 98%  Weight:      Height:        Intake/Output Summary (Last 24 hours) at 11/19/2017 1722 Last data filed at  11/19/2017 1000 Gross per 24 hour  Intake 858.17 ml  Output 600 ml  Net 258.17 ml   Filed Weights   11/16/17 2019 11/18/17 1313  Weight: 70.4 kg (155 lb 3.3 oz) 71.7 kg (158 lb 1.1 oz)   General: Alert, Awake and difficult to assess to assess orientation  Appear in moderate distress, affect flat Eyes: PERRL, Conjunctiva normal ENT: Oral Mucosa clear dry. Neck: difficult to assess JVD, no Abnormal Mass Or lumps Cardiovascular: S1 and S2 Present, no Murmur, Peripheral Pulses Present Respiratory: increased respiratory effort, Bilateral Air entry equal and Decreased, no use of accessory muscle, bilateral  Crackles, no wheezes Abdomen: Bowel Sound present, Soft and jno tenderness, no hernia Skin: no redness, no Rash, no induration Extremities: no Pedal edema, no calf tenderness Neurologic: Grossly no focal neuro deficit. Bilaterally Equal motor strength, chronic aphasia   Data Reviewed: CBC: Recent Labs  Lab 11/16/17 1642 11/17/17 0502 11/18/17 0448 11/19/17 0818  WBC 14.1* 12.2* 30.3* 14.4*  NEUTROABS 10.4* 8.6* 25.2* 11.1  HGB 17.1* 15.8 15.3 15.9  HCT 53.8* 50.9 49.7 52.0  MCV 95.9 97.1 97.8 97.2  PLT 323 323 296 144   Basic Metabolic Panel: Recent Labs  Lab 11/16/17 1642 11/17/17 0502 11/18/17 0448 11/19/17 0818  NA 141 141 146* 147*  K 4.4 5.0 4.8 4.8  CL 97* 100 104 104  CO2 32 32 30 34*  GLUCOSE 93 88 96 65*  BUN 38* 38* 38* 34*  CREATININE 0.94 1.21 1.43* 1.10  CALCIUM 10.3 9.9 10.0 9.9  MG  --   --   --  2.2    Liver Function Tests: Recent Labs  Lab 11/16/17 1642 11/19/17 0818  AST 24 18  ALT 22 15  ALKPHOS 107 75  BILITOT 0.3 1.1  PROT 8.9* 7.9  ALBUMIN 3.5 2.8*   Recent Labs  Lab 11/16/17 1642  LIPASE 22   No results for input(s): AMMONIA in the last 168 hours. Coagulation Profile: No results for input(s): INR, PROTIME in the last 168 hours. Cardiac Enzymes: No results for input(s): CKTOTAL, CKMB, CKMBINDEX, TROPONINI in the last 168  hours. BNP (last 3 results) No results for input(s): PROBNP in the last 8760 hours. CBG: Recent Labs  Lab 11/19/17 0911 11/19/17 1026 11/19/17 1124 11/19/17 1529  GLUCAP 62* 142* 97 67*   Studies: No results found.  Scheduled Meds: . acetaminophen  1,000 mg Per Tube TID  . aspirin  81 mg Per Tube Daily  . cloNIDine  0.1 mg Transdermal Weekly  . clopidogrel  75 mg Per Tube Daily  . enoxaparin (LOVENOX) injection  40 mg Subcutaneous Q24H  . feeding supplement (PRO-STAT SUGAR FREE 64)  30 mL Oral QHS  . gabapentin  100 mg Per Tube Q8H  . glycopyrrolate  1 mg Per Tube BID  . ipratropium-albuterol  3 mL Nebulization QID  . mouth rinse  10 mL Mouth Rinse QID  . polyethylene glycol  17 g Per Tube BID  . ranitidine  150 mg Per Tube Daily  . thiamine  100 mg Per Tube Daily   Continuous Infusions: . sodium chloride 20 mL/hr at 11/18/17 1230  . dextrose 5 %  and 0.45% NaCl 50 mL/hr at 11/19/17 1611  . feeding supplement (JEVITY 1.2 CAL) 20 mL/hr at 11/19/17 1531  . piperacillin-tazobactam (ZOSYN)  IV 3.375 g (11/19/17 1615)   PRN Meds: sodium chloride, atropine, dextrose, ondansetron (ZOFRAN) IV  Time spent: The patient is critically ill with multiple organ systems failure and requires high complexity decision making for assessment and support, frequent evaluation and titration of therapies. Critical Care Time devoted to patient care services described in this note is 35 minutes   Author: Berle Mull, MD [facility] Pager: [phone] 11/19/2017 5:22 PM  If 7PM-7AM, please contact night-coverage at [email or web address], password [facility]

## 2017-11-19 NOTE — Progress Notes (Signed)
Nutrition Follow-up  DOCUMENTATION CODES:   Non-severe (moderate) malnutrition in context of chronic illness  INTERVENTION:   -Continue Jevity 1.2 @ 10 ml/hr via PEG, advance by 10 ml every 6 hours to goal rate of 60 ml/hr. -30 ml Prostat daily -Free water flushes of 100 ml every 3 hours (800 ml) -Provides 1828 kcal, 94g protein and 1962 ml H2O.  NUTRITION DIAGNOSIS:   Moderate Malnutrition related to chronic illness, dysphagia(aspiration pneumonia) as evidenced by moderate fat depletion, moderate muscle depletion.  GOAL:   Patient will meet greater than or equal to 90% of their needs  Progressing.  MONITOR:   Labs, Weight trends, I & O's, TF tolerance  REASON FOR ASSESSMENT:   Consult Enteral/tube feeding initiation and management  ASSESSMENT:   67 y.o. 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   Patient in room with wife at bedside. Per RN, pt has just started Jevity 1.2 at 10 ml/hr. Will advance by 10 ml every 6 hours to goal rate of 60 ml/hr. Prior to N/V symptoms PTA, pt was tolerating Jevity 1.2 @ 60 ml/hr via PEG. Per RN, pt was transferred to ICU d/t aspiration of "brown mucus", suspect of feces. Pt has been constipated but since starting laxatives has been having more BMs per RN.  Per MD note, pt may transfer to [facility].  Medications: Miralax packet BID, Thiamine tablet daily Labs reviewed: CBGs: 97-142 Elevated Na  NUTRITION - FOCUSED PHYSICAL EXAM:    Most Recent Value  Orbital Region  Moderate depletion  Upper Arm Region  Moderate depletion  Thoracic and Lumbar Region  Unable to assess  Buccal Region  Mild depletion  Temple Region  Moderate depletion  Clavicle Bone  Region  Mild depletion  Clavicle and Acromion Bone Region  Mild depletion  Scapular Bone Region  Unable to assess  Dorsal Hand  Mild depletion  Patellar Region  Moderate depletion  Anterior Thigh Region  No depletion  Posterior Calf Region  Moderate depletion  Edema (RD Assessment)  None       Diet Order:   Diet Order           Diet NPO time specified  Diet effective now          EDUCATION NEEDS:   Not appropriate for education at this time  Skin:  Skin Assessment: Reviewed RN Assessment  Last BM:  8/4  Height:   Ht Readings from Last 1 Encounters:  11/18/17 6' (1.829 m)    Weight:   Wt Readings from Last 1 Encounters:  11/18/17 158 lb 1.1 oz (71.7 kg)    Ideal Body Weight:  80.9 kg  BMI:  Body mass index is 21.44 kg/m.  Estimated Nutritional Needs:   Kcal:  6295-2841  Protein:  95-105g  Fluid:  2L/day   Clayton Bibles, MS, RD, LDN [place] Dietitian Pager: [phone] After Hours Pager: [phone]

## 2017-11-19 NOTE — Progress Notes (Signed)
Patient refused to allow staff to collect lab and to give medicate with Neurontin per order.

## 2017-11-20 ENCOUNTER — Inpatient Hospital Stay (HOSPITAL_COMMUNITY): Payer: Medicare HMO

## 2017-11-20 ENCOUNTER — Encounter (HOSPITAL_COMMUNITY): Payer: Self-pay | Admitting: Radiology

## 2017-11-20 HISTORY — PX: IR REPLACE G-TUBE SIMPLE WO FLUORO: IMG2323

## 2017-11-20 LAB — COMPREHENSIVE METABOLIC PANEL
ALT: 13 U/L (ref 0–44)
AST: 15 U/L (ref 15–41)
Albumin: 2.6 g/dL — ABNORMAL LOW (ref 3.5–5.0)
Alkaline Phosphatase: 70 U/L (ref 38–126)
Anion gap: 7 (ref 5–15)
BUN: 26 mg/dL — ABNORMAL HIGH (ref 8–23)
CO2: 32 mmol/L (ref 22–32)
Calcium: 9.5 mg/dL (ref 8.9–10.3)
Chloride: 110 mmol/L (ref 98–111)
Creatinine, Ser: 0.94 mg/dL (ref 0.61–1.24)
GFR calc Af Amer: >60 mL/min (ref >60)
GFR calc non Af Amer: >60 mL/min (ref >60)
Glucose, Bld: 103 mg/dL — ABNORMAL HIGH (ref 70–99)
Potassium: 4.1 mmol/L (ref 3.5–5.1)
Sodium: 149 mmol/L — ABNORMAL HIGH (ref 135–145)
Total Bilirubin: 0.9 mg/dL (ref 0.3–1.2)
Total Protein: 6.8 g/dL (ref 6.5–8.1)

## 2017-11-20 LAB — GLUCOSE, CAPILLARY
Glucose-Capillary: 100 mg/dL — ABNORMAL HIGH (ref 70–99)
Glucose-Capillary: 114 mg/dL — ABNORMAL HIGH (ref 70–99)
Glucose-Capillary: 75 mg/dL (ref 70–99)
Glucose-Capillary: 89 mg/dL (ref 70–99)
Glucose-Capillary: 101 mg/dL — ABNORMAL HIGH (ref 70–99)
Glucose-Capillary: 77 mg/dL (ref 70–99)

## 2017-11-20 LAB — CBC WITH DIFFERENTIAL/PLATELET
Basophils Absolute: 0 10*3/uL (ref 0.0–0.1)
Basophils Relative: 0 %
Eosinophils Absolute: 0.4 10*3/uL (ref 0.0–0.7)
Eosinophils Relative: 3 %
HCT: 40.6 % (ref 39.0–52.0)
Hemoglobin: 12.4 g/dL — ABNORMAL LOW (ref 13.0–17.0)
Lymphocytes Relative: 14 %
Lymphs Abs: 1.7 10*3/uL (ref 0.7–4.0)
MCH: 29.8 pg (ref 26.0–34.0)
MCHC: 30.5 g/dL (ref 30.0–36.0)
MCV: 97.6 fL (ref 78.0–100.0)
Monocytes Absolute: 1.3 10*3/uL — ABNORMAL HIGH (ref 0.1–1.0)
Monocytes Relative: 11 %
Neutro Abs: 8.9 10*3/uL — ABNORMAL HIGH (ref 1.7–7.7)
Neutrophils Relative %: 72 %
Platelets: 278 10*3/uL (ref 150–400)
RBC: 4.16 MIL/uL — ABNORMAL LOW (ref 4.22–5.81)
RDW: 15.3 % (ref 11.5–15.5)
WBC: 12.4 10*3/uL — ABNORMAL HIGH (ref 4.0–10.5)

## 2017-11-20 MED ORDER — SORBITOL 70 % SOLN
960.0000 mL | TOPICAL_OIL | Freq: Once | ORAL | Status: AC
  Administered 2017-11-20: 960 mL via RECTAL
  Filled 2017-11-20: qty 473

## 2017-11-20 MED ORDER — SODIUM CHLORIDE 0.9 % IV SOLN
3.0000 g | Freq: Four times a day (QID) | INTRAVENOUS | Status: DC
  Administered 2017-11-20 – 2017-11-22 (×9): 3 g via INTRAVENOUS
  Filled 2017-11-20 (×9): qty 3

## 2017-11-20 NOTE — Progress Notes (Signed)
Pharmacy Antibiotic Note  Scott Rivera is a 67 y.o. male admitted on 11/16/2017 with aspiration pneumonia. Patient initially placed on Zosyn, now narrowed to Unasyn. Pharmacy consulted to assist with dosing.   Plan:  Unasyn 3g IV q6h  Monitor renal function, cultures, clinical course.   Height: 6' (182.9 cm) Weight: 159 lb 6.3 oz (72.3 kg) IBW/kg (Calculated) : 77.6  Temp (24hrs), Avg:98.2 F (36.8 C), Min:97.8 F (36.6 C), Max:98.6 F (37 C)  Recent Labs  Lab 11/16/17 1642 11/16/17 1649 11/17/17 0502 11/18/17 0448 11/19/17 0818 11/20/17 0312  WBC 14.1*  --  12.2* 30.3* 14.4* 12.4*  CREATININE 0.94  --  1.21 1.43* 1.10 0.94  LATICACIDVEN  --  1.46  --   --   --   --     Estimated Creatinine Clearance: 78 mL/min (by C-G formula based on SCr of 0.94 mg/dL).    No Known Allergies  Antimicrobials this admission: 8/2 Zosyn >> 8/6 8/6 Unasyn >>   Microbiology results: 8/2 BCx: NGTD 8/4 Trach aspirate: rare GPR, GPC on gram stain, cx in process   8/4 MRSA PCR: negative   Thank you for allowing pharmacy to be a part of this patient's care.   Lindell Spar, PharmD, BCPS Pager: [phone] 11/20/2017 8:19 AM

## 2017-11-20 NOTE — Progress Notes (Signed)
[facility] Progress Note  Patient: Scott Rivera FBP:[id number]   PCP: Jamesetta Orleans, PA-C DOB: [birth year]   DOA: 11/16/2017   DOS: 11/20/2017   Date of Service: the patient was seen and examined on 11/20/2017  Subjective: This morning the patient had leakage of tube feedings around the tube.  No other acute events.  No nausea no vomiting.  Somewhat agitated yesterday but now calm down.  Brief hospital course: Pt. with PMH of CVA with aphasia, PEG tube placement, tracheostomy placement, deconditioning with protein calorie malnutrition, recurrent aspiration pneumonia; admitted on 11/16/2017, presented with complaint of cough and shortness of breath, was found to have aspiration pneumonia secondary to vomiting from constipation. Currently further plan is continue IV antibiotics and monitor tolerance of tube feeding  Assessment and Plan: 1.  Acute hypoxic respiratory failure. Aspiration pneumonia. Current FiO2 28% baseline on room air, initially needing 60% FiO2. Patient has problem with significant secretion. Significant improvement with chest PT with bed. Repeat chest x-ray shows left lung infiltrate. Likely patient aspirating from vomiting. Continue IV antibiotic, change Zosyn to Unasyn,Follow-up on deep respiratory culture.  Can likely switch to Augmentin suspension tomorrow. Will add Robinul scheduled.  Continue PRN atropine for secretion.  2.  Nausea and vomiting. Severe constipation. Primary reason for patient's presentation was nausea and vomiting, x-ray abdomen shows severe constipation with stool in rectal vault. Responding well to one-time dose of smog enema.   Give 1 more dose today. Continue Senokot and MiraLAX. Will slowly advance tube feeding.  Holding on free water.  3.  CVA. At baseline aphasia and residual weakness. Dependent on PEG and trach. No acute deficit. Continue aspirin Plavix and statin.  4.  Essential hypertension. Patient is on clonidine. Monitor for  hypotension while patient being treated for sepsis.  5.  Acute kidney injury. Renal function mildly worsened from baseline likely acute Patient was given IV hydration currently on hold with concern for worsening respiratory status. Monitor While the patient is receiving IV fluids gently  6.  Hypoglycemia. Resolved  7.  Tube feed malfunction. Patient had leakage of the G-tube.  IR consulted and currently replaced on 11/20/2017.   Diet: NPO, resume tube feeding DVT Prophylaxis: subcutaneous Heparin  Advance goals of care discussion: full code  Family Communication: family was present at bedside, at the time of interview. The pt provided permission to discuss medical plan with the family. Opportunity was given to ask question and all questions were answered satisfactorily.   Disposition:  Discharge to be determined. Family initially wanted the patient to be transferred to [facility], currently the [facility] does not have any beds for to stepdown.  Family requested the patient be transferred to [facility] stepdown and that is in process.  Consultants: None Procedures: none  Antibiotics: Anti-infectives (From admission, onward)   Start     Dose/Rate Route Frequency Ordered Stop   11/20/17 1000  Ampicillin-Sulbactam (UNASYN) 3 g in sodium chloride 0.9 % 100 mL IVPB     3 g 200 mL/hr over 30 Minutes Intravenous Every 6 hours 11/20/17 0814     11/17/17 0100  piperacillin-tazobactam (ZOSYN) IVPB 3.375 g  Status:  Discontinued     3.375 g 12.5 mL/hr over 240 Minutes Intravenous Every 8 hours 11/16/17 1909 11/20/17 0812   11/16/17 1830  piperacillin-tazobactam (ZOSYN) IVPB 3.375 g     3.375 g 100 mL/hr over 30 Minutes Intravenous  Once 11/16/17 1828 11/16/17 1918   11/16/17 1815  piperacillin-tazobactam (ZOSYN) IVPB  4.5 g  Status:  Discontinued     4.5 g 200 mL/hr over 30 Minutes Intravenous  Once 11/16/17 1813 11/16/17 1826       Objective: Physical Exam: Vitals:     11/20/17 0838 11/20/17 1128 11/20/17 1200 11/20/17 1535  BP:   (!) 164/91   Pulse: 89  98   Resp: 18     Temp:      TempSrc:      SpO2: 94% 98% 100% 100%  Weight:      Height:        Intake/Output Summary (Last 24 hours) at 11/20/2017 1634 Last data filed at 11/20/2017 1500 Gross per 24 hour  Intake 1518.01 ml  Output 1200 ml  Net 318.01 ml   Filed Weights   11/16/17 2019 11/18/17 1313 11/20/17 0500  Weight: 70.4 kg (155 lb 3.3 oz) 71.7 kg (158 lb 1.1 oz) 72.3 kg (159 lb 6.3 oz)   General: Alert, Awake and difficult to assess to assess orientation  Appear in moderate distress, affect flat Eyes: PERRL, Conjunctiva normal ENT: Oral Mucosa clear dry. Neck: difficult to assess JVD, no Abnormal Mass Or lumps Cardiovascular: S1 and S2 Present, no Murmur, Peripheral Pulses Present Respiratory: increased respiratory effort, Bilateral Air entry equal and Decreased, no use of accessory muscle, bilateral  Crackles, no wheezes Abdomen: Bowel Sound present, Soft and jno tenderness, no hernia Skin: no redness, no Rash, no induration Extremities: no Pedal edema, no calf tenderness Neurologic: Grossly no focal neuro deficit. Bilaterally Equal motor strength, chronic aphasia   Data Reviewed: CBC: Recent Labs  Lab 11/16/17 1642 11/17/17 0502 11/18/17 0448 11/19/17 0818 11/20/17 0312  WBC 14.1* 12.2* 30.3* 14.4* 12.4*  NEUTROABS 10.4* 8.6* 25.2* 11.1 8.9*  HGB 17.1* 15.8 15.3 15.9 12.4*  HCT 53.8* 50.9 49.7 52.0 40.6  MCV 95.9 97.1 97.8 97.2 97.6  PLT 323 323 296 186 876   Basic Metabolic Panel: Recent Labs  Lab 11/16/17 1642 11/17/17 0502 11/18/17 0448 11/19/17 0818 11/20/17 0312  NA 141 141 146* 147* 149*  K 4.4 5.0 4.8 4.8 4.1  CL 97* 100 104 104 110  CO2 32 32 30 34* 32  GLUCOSE 93 88 96 65* 103*  BUN 38* 38* 38* 34* 26*  CREATININE 0.94 1.21 1.43* 1.10 0.94  CALCIUM 10.3 9.9 10.0 9.9 9.5  MG  --   --   --  2.2  --     Liver Function Tests: Recent Labs  Lab  11/16/17 1642 11/19/17 0818 11/20/17 0312  AST 24 18 15   ALT 22 15 13   ALKPHOS 107 75 70  BILITOT 0.3 1.1 0.9  PROT 8.9* 7.9 6.8  ALBUMIN 3.5 2.8* 2.6*   Recent Labs  Lab 11/16/17 1642  LIPASE 22   No results for input(s): AMMONIA in the last 168 hours. Coagulation Profile: No results for input(s): INR, PROTIME in the last 168 hours. Cardiac Enzymes: No results for input(s): CKTOTAL, CKMB, CKMBINDEX, TROPONINI in the last 168 hours. BNP (last 3 results) No results for input(s): PROBNP in the last 8760 hours. CBG: Recent Labs  Lab 11/19/17 2310 11/20/17 0313 11/20/17 0738 11/20/17 1229 11/20/17 1546  GLUCAP 94 100* 114* 75 89   Studies: Ir Replace G-tube Simple Wo Fluoro  Result Date: 11/20/2017 INDICATION: Chronic dysphagia requiring prolonged enteral nutrition via percutaneous gastrostomy tube. Gastrostomy tube originally placed in 2018, has been replaced multiple times. Request evaluation for persistent leakage. EXAM: REPLACEMENT OF GASTROSTOMY TUBE COMPARISON:  None. MEDICATIONS: None. CONTRAST:  None FLUOROSCOPY TIME:  None. COMPLICATIONS: None immediate. PROCEDURE: Informed written consent was obtained from the patient after a discussion of the risks, benefits and alternatives to treatment. Questions regarding the procedure were encouraged and answered. A timeout was performed prior to the initiation of the procedure. The upper abdomen and external portion of the existing gastrostomy tube was prepped and draped in the usual sterile fashion, and a sterile drape was applied covering the operative field. Maximum barrier sterile technique with sterile gowns and gloves were used for the procedure. A timeout was performed prior to the initiation of the procedure. The existing balloon retention gastrostomy tube was removed and exchanged for a new 20-French MIC balloon inflatable gastrostomy tube. The balloon was inflated and disc was cinched. The tube was flushed with tap water  without difficulty. A dressing was placed. The patient tolerated the procedure well without immediate postprocedural complication. IMPRESSION: Successful bedside replacement of a new 20-French gastrostomy tube. The new gastrostomy tube is ready for immediate use. Read by: Ascencion Dike PA-C Electronically Signed   By: Sandi Mariscal M.D.   On: 11/20/2017 14:15   Dg Chest Port 1 View  Result Date: 11/20/2017 CLINICAL DATA:  Increased chest congestion. Increased secretions from tracheostomy tube. EXAM: PORTABLE CHEST 1 VIEW COMPARISON:  Chest x-ray of November 18, 2017 FINDINGS: There is persistent infiltrate in the left mid mid and lower lung. The right lung exhibits no infiltrate. The interstitial markings are coarse though stable. The heart is normal in size. The pulmonary vascularity is not clearly engorged. The observed bony thorax exhibits no acute abnormality. IMPRESSION: Persistent left-sided pneumonia not greatly changed over the past 2 days. Electronically Signed   By: David  Martinique M.D.   On: 11/20/2017 09:50   Dg Abd Portable 1v  Result Date: 11/20/2017 CLINICAL DATA:  Constipation. EXAM: PORTABLE ABDOMEN - 1 VIEW COMPARISON:  Abdominal radiograph of November 18, 2017 FINDINGS: The colonic stool burden remains increased. There is slightly increased rectal stool burden. A gastrostomy tube is present. There are surgical skin staples just to the left of midline. There are degenerative changes of the lumbar spine. 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 IMPRESSION: Persistently increased colonic stool burden possibly slightly decreased since the previous study. Mild increase in rectal stool burden may reflect a fecal impaction. Electronically Signed   By: David  Martinique M.D.   On: 11/20/2017 09:47    Scheduled Meds: . acetaminophen  1,000 mg Per Tube TID  . aspirin  81 mg Per Tube Daily  . cloNIDine  0.1 mg Transdermal Weekly  . clopidogrel  75 mg Per  Tube Daily  . enoxaparin (LOVENOX) injection  40 mg Subcutaneous Q24H  . feeding supplement (PRO-STAT SUGAR FREE 64)  30 mL Oral QHS  . gabapentin  100 mg Per Tube Q8H  . glycopyrrolate  1 mg Per Tube BID  . ipratropium-albuterol  3 mL Nebulization QID  . mouth rinse  10 mL Mouth Rinse QID  . polyethylene glycol  17 g Per Tube BID  . ranitidine  150 mg Per Tube Daily  . thiamine  100 mg Per Tube Daily   Continuous Infusions: . sodium chloride 20 mL/hr at 11/20/17 0643  . ampicillin-sulbactam (UNASYN) IV Stopped (11/20/17 1658)  . feeding supplement (JEVITY 1.2 CAL) 40 mL/hr at 11/20/17 1420   PRN Meds: sodium chloride, atropine, dextrose, ondansetron (ZOFRAN) IV  Time spent: 35 minutes   Author: Berle Mull, MD [facility]  Pager: [phone] 11/20/2017 4:34 PM  If 7PM-7AM, please contact night-coverage at [email or web address], password [facility]

## 2017-11-20 NOTE — Progress Notes (Signed)
   11/20/17 1000  Clinical Encounter Type  Visited With Patient  Visit Type Initial;Psychological support;Spiritual support;Critical Care  Referral From Nurse  Consult/Referral To Chaplain  Spiritual Encounters  Spiritual Needs Emotional;Other (Comment) (Spiritual Care Conversation/Support)  Stress Factors  Patient Stress Factors Health changes;Lack of caregivers;Major life changes   I visited with the patient per Spiritual Care consult and referral from the nurse. The patient was unable to speak,but was able to nod to answer yes or no questions.  The patient stated that he does not have good support and that he was feeling sad today. When asked if there was anything that I could do for him, he nodded no. He would like a follow-up visit.   I will follow-up with the patient at a later time.   Please, contact Spiritual Care for further assistance.   Chaplain Shanon Ace M.Div., [facility]

## 2017-11-21 ENCOUNTER — Inpatient Hospital Stay (HOSPITAL_COMMUNITY): Payer: Medicare HMO

## 2017-11-21 ENCOUNTER — Encounter (HOSPITAL_COMMUNITY): Payer: Self-pay | Admitting: Radiology

## 2017-11-21 DIAGNOSIS — G8929 Other chronic pain: Secondary | ICD-10-CM

## 2017-11-21 DIAGNOSIS — I10 Essential (primary) hypertension: Secondary | ICD-10-CM

## 2017-11-21 DIAGNOSIS — N183 Chronic kidney disease, stage 3 (moderate): Secondary | ICD-10-CM

## 2017-11-21 DIAGNOSIS — M25562 Pain in left knee: Secondary | ICD-10-CM

## 2017-11-21 DIAGNOSIS — M25561 Pain in right knee: Secondary | ICD-10-CM

## 2017-11-21 DIAGNOSIS — T17908A Unspecified foreign body in respiratory tract, part unspecified causing other injury, initial encounter: Secondary | ICD-10-CM

## 2017-11-21 DIAGNOSIS — J690 Pneumonitis due to inhalation of food and vomit: Secondary | ICD-10-CM

## 2017-11-21 LAB — CBC WITH DIFFERENTIAL/PLATELET
Basophils Absolute: 0 10*3/uL (ref 0.0–0.1)
Basophils Relative: 0 %
Eosinophils Absolute: 0.4 10*3/uL (ref 0.0–0.7)
Eosinophils Relative: 3 %
HCT: 43.5 % (ref 39.0–52.0)
Hemoglobin: 13.4 g/dL (ref 13.0–17.0)
Lymphocytes Relative: 19 %
Lymphs Abs: 2.1 10*3/uL (ref 0.7–4.0)
MCH: 29.6 pg (ref 26.0–34.0)
MCHC: 30.8 g/dL (ref 30.0–36.0)
MCV: 96.2 fL (ref 78.0–100.0)
Monocytes Absolute: 1.5 10*3/uL — ABNORMAL HIGH (ref 0.1–1.0)
Monocytes Relative: 13 %
Neutro Abs: 7.3 10*3/uL (ref 1.7–7.7)
Neutrophils Relative %: 65 %
Platelets: 313 10*3/uL (ref 150–400)
RBC: 4.52 MIL/uL (ref 4.22–5.81)
RDW: 15 % (ref 11.5–15.5)
WBC: 11.2 10*3/uL — ABNORMAL HIGH (ref 4.0–10.5)

## 2017-11-21 LAB — COMPREHENSIVE METABOLIC PANEL
ALT: 15 U/L (ref 0–44)
AST: 20 U/L (ref 15–41)
Albumin: 2.9 g/dL — ABNORMAL LOW (ref 3.5–5.0)
Alkaline Phosphatase: 83 U/L (ref 38–126)
Anion gap: 8 (ref 5–15)
BUN: 20 mg/dL (ref 8–23)
CO2: 31 mmol/L (ref 22–32)
Calcium: 9.9 mg/dL (ref 8.9–10.3)
Chloride: 110 mmol/L (ref 98–111)
Creatinine, Ser: 0.74 mg/dL (ref 0.61–1.24)
GFR calc Af Amer: >60 mL/min (ref >60)
GFR calc non Af Amer: >60 mL/min (ref >60)
Glucose, Bld: 116 mg/dL — ABNORMAL HIGH (ref 70–99)
Potassium: 3.8 mmol/L (ref 3.5–5.1)
Sodium: 149 mmol/L — ABNORMAL HIGH (ref 135–145)
Total Bilirubin: 0.7 mg/dL (ref 0.3–1.2)
Total Protein: 7.4 g/dL (ref 6.5–8.1)

## 2017-11-21 LAB — GLUCOSE, CAPILLARY
Glucose-Capillary: 113 mg/dL — ABNORMAL HIGH (ref 70–99)
Glucose-Capillary: 92 mg/dL (ref 70–99)
Glucose-Capillary: 99 mg/dL (ref 70–99)
Glucose-Capillary: 130 mg/dL — ABNORMAL HIGH (ref 70–99)
Glucose-Capillary: 77 mg/dL (ref 70–99)

## 2017-11-21 LAB — CULTURE, BLOOD (ROUTINE X 2)
Culture: NO GROWTH
Culture: NO GROWTH

## 2017-11-21 MED ORDER — AMLODIPINE 1 MG/ML ORAL SUSPENSION: 5.0000 mg | Freq: Every day | ORAL | Status: DC

## 2017-11-21 MED ORDER — AMLODIPINE BESYLATE 5 MG PO TABS
5.0000 mg | ORAL_TABLET | Freq: Every day | ORAL | Status: DC
  Administered 2017-11-21: 5 mg
  Filled 2017-11-21: qty 1

## 2017-11-21 MED ORDER — IOHEXOL 300 MG/ML  SOLN
100.0000 mL | Freq: Once | INTRAMUSCULAR | Status: AC | PRN
  Administered 2017-11-21: 100 mL via INTRAVENOUS

## 2017-11-21 MED ORDER — ORAL CARE MOUTH RINSE
15.0000 mL | Freq: Two times a day (BID) | OROMUCOSAL | Status: DC
  Administered 2017-11-22: 15 mL via OROMUCOSAL

## 2017-11-21 MED ORDER — LORAZEPAM 2 MG/ML IJ SOLN: 2.0000 mg | Freq: Four times a day (QID) | INTRAMUSCULAR | Status: DC | PRN

## 2017-11-21 MED ORDER — CHLORHEXIDINE GLUCONATE 0.12 % MT SOLN
15.0000 mL | Freq: Two times a day (BID) | OROMUCOSAL | Status: DC
  Administered 2017-11-21 – 2017-11-22 (×2): 15 mL via OROMUCOSAL
  Filled 2017-11-21 (×2): qty 15

## 2017-11-21 MED ORDER — ALBUTEROL SULFATE (2.5 MG/3ML) 0.083% IN NEBU: 2.5000 mg | INHALATION_SOLUTION | RESPIRATORY_TRACT | Status: DC | PRN

## 2017-11-21 MED ORDER — HYDRALAZINE HCL 20 MG/ML IJ SOLN
10.0000 mg | Freq: Four times a day (QID) | INTRAMUSCULAR | Status: DC | PRN
  Administered 2017-11-22: 10 mg via INTRAVENOUS
  Filled 2017-11-21: qty 1

## 2017-11-21 MED ORDER — IOPAMIDOL (ISOVUE-300) INJECTION 61%
30.0000 mL | Freq: Once | INTRAVENOUS | Status: AC | PRN
Start: 2017-11-21 — End: 2017-11-21
  Administered 2017-11-21: 30 mL via ORAL

## 2017-11-21 MED ORDER — LORAZEPAM 2 MG/ML IJ SOLN
2.0000 mg | Freq: Once | INTRAMUSCULAR | Status: DC | PRN
  Filled 2017-11-21: qty 1

## 2017-11-21 NOTE — Progress Notes (Signed)
This RN spoke to pt. Sister about pt. POC and pts. desires. Prior to conversation, this RN had conversation with pt. about his wishes. He stated he wants to go (repeatedly), and when asked home he nodded no. When RN asked do you want medical interventions, he nodded No. He nodded no when asked do you want to continue to live like this. This RN expressed that conversation with his sister [name]), pt. nodded yes vehemently when RN expressed patient desires, NT in room during conversation and witnessed this expression as well. Sister states he just wants to go home and he is happy at home. Spoke to MD this am and will update her on conversation.

## 2017-11-21 NOTE — Progress Notes (Signed)
Trach check and neb not done due to pt not in room.

## 2017-11-21 NOTE — Progress Notes (Signed)
Patient had 15 beat run SVT. Patient's been irritable, anxious & combative when giving care this evening. Paged Baltazar Najjar. Continue to monitor.

## 2017-11-21 NOTE — Progress Notes (Signed)
PROGRESS NOTE  Scott Rivera JGG:[id number] DOB: Oct 21, 1950 DOA: 11/16/2017 PCP: Jamesetta Orleans, PA-C  HPI/Recap of past 24 hours:  Pt. with PMH of CVA with aphasia, PEG tube placement, tracheostomy placement, deconditioning with protein calorie malnutrition, recurrent aspiration pneumonia; admitted on 11/16/2017, presented with complaint of cough and shortness of breath, was found to have aspiration pneumonia secondary to vomiting from constipation. Currently further plan is continue IV antibiotics and monitor tolerance of tube feeding  11/21/2017: Patient seen and examined at his bedside is essentially nonverbal.  However he is able to answer to simple questions by nodding yes or no.  Denies pain or shortness of breath.     Assessment/Plan: Principal Problem:   Aspiration pneumonia ([place]) Active Problems:   History of completed stroke   Benign essential HTN   Chronic kidney disease   Aspiration into airway   Bilateral chronic knee pain   Anemia due to chronic kidney disease   H/O tracheostomy   S/P percutaneous endoscopic gastrostomy (PEG) tube placement ([facility])   Nausea and vomiting   Malnutrition of moderate degree  Acute hypoxic respiratory failure secondary to aspiration pneumonia Trach collar in place Continue IV antibiotics Unasyn Continue to monitor fever curve Continue to monitor WBC Obtain CBC in the morning  Dysphagia status post PEG tube placement Per RN PEG tube stoma has malodorous Obtain body culture  Accelerated hypertension Continue antihypertensive PEG tube Start IV hydralazine PRN for systolic blood pressure greater than 546 or diastolic blood pressure in 105  Goals of care Palliative care consulted to establish goals of care  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   Consultants: Interventional  radiology  Procedures:  PEG tube placement  Antimicrobials:  Unasyn  DVT prophylaxis: Subcu Lovenox daily    Objective: Vitals:   11/21/17 0600 11/21/17 0800 11/21/17 0859 11/21/17 1141  BP: (!) 178/88 (!) 160/85    Pulse: 94 87 (!) 105 93  Resp: (!) 24 17 (!) 23 18  Temp:  98.6 F (37 C)    TempSrc:  Oral    SpO2: 100% 100% 99% 99%  Weight:      Height:        Intake/Output Summary (Last 24 hours) at 11/21/2017 1245 Last data filed at 11/21/2017 0400 Gross per 24 hour  Intake 791.72 ml  Output 1550 ml  Net -758.28 ml   Filed Weights   11/18/17 1313 11/20/17 0500 11/21/17 0500  Weight: 71.7 kg (158 lb 1.1 oz) 72.3 kg (159 lb 6.3 oz) 70.7 kg (155 lb 13.8 oz)    Exam:  . General: 67 y.o. year-old male frail.  Alert and minimally interactive.  Nonverbal. . Cardiovascular: Regular rate and rhythm with no rubs or gallops.  No thyromegaly or JVD noted.   Marland Kitchen Respiratory: Mild rales at bases with no wheezes. Good inspiratory effort.  Trach collar in place. . Abdomen: Soft nontender nondistended with normal bowel sounds x4 quadrants. . Musculoskeletal: Significant swelling affecting knees bilaterally.  Nontender on palpation and no erythema. Marland Kitchen Psychiatry: Mood is anxious.   Data Reviewed: CBC: Recent Labs  Lab 11/17/17 0502 11/18/17 0448 11/19/17 0818 11/20/17 0312 11/21/17 0308  WBC 12.2* 30.3* 14.4* 12.4* 11.2*  NEUTROABS 8.6* 25.2* 11.1 8.9* 7.3  HGB 15.8 15.3 15.9 12.4* 13.4  HCT 50.9 49.7 52.0 40.6 43.5  MCV 97.1 97.8 97.2 97.6 96.2  PLT 323 296 186 278 409   Basic Metabolic Panel: Recent Labs  Lab 11/17/17 0502 11/18/17 0448 11/19/17 0818 11/20/17 0312 11/21/17 0308  NA 141 146* 147* 149* 149*  K 5.0 4.8 4.8 4.1 3.8  CL 100 104 104 110 110  CO2 32 30 34* 32 31  GLUCOSE 88 96 65* 103* 116*  BUN 38* 38* 34* 26* 20  CREATININE 1.21 1.43* 1.10 0.94 0.74  CALCIUM 9.9 10.0 9.9 9.5 9.9  MG  --   --  2.2  --   --    GFR: Estimated Creatinine  Clearance: 89.6 mL/min (by C-G formula based on SCr of 0.74 mg/dL). Liver Function Tests: Recent Labs  Lab 11/16/17 1642 11/19/17 0818 11/20/17 0312 11/21/17 0308  AST 24 18 15 20   ALT 22 15 13 15   ALKPHOS 107 75 70 83  BILITOT 0.3 1.1 0.9 0.7  PROT 8.9* 7.9 6.8 7.4  ALBUMIN 3.5 2.8* 2.6* 2.9*   Recent Labs  Lab 11/16/17 1642  LIPASE 22   No results for input(s): AMMONIA in the last 168 hours. Coagulation Profile: No results for input(s): INR, PROTIME in the last 168 hours. Cardiac Enzymes: No results for input(s): CKTOTAL, CKMB, CKMBINDEX, TROPONINI in the last 168 hours. BNP (last 3 results) No results for input(s): PROBNP in the last 8760 hours. HbA1C: No results for input(s): HGBA1C in the last 72 hours. CBG: Recent Labs  Lab 11/20/17 1940 11/20/17 2301 11/21/17 0308 11/21/17 0824 11/21/17 1203  GLUCAP 101* 77 113* 92 99   Lipid Profile: No results for input(s): CHOL, HDL, LDLCALC, TRIG, CHOLHDL, LDLDIRECT in the last 72 hours. Thyroid Function Tests: No results for input(s): TSH, T4TOTAL, FREET4, T3FREE, THYROIDAB in the last 72 hours. Anemia Panel: No results for input(s): VITAMINB12, FOLATE, FERRITIN, TIBC, IRON, RETICCTPCT in the last 72 hours. Urine analysis:    Component Value Date/Time   COLORURINE YELLOW 09/30/2017 1800   APPEARANCEUR CLOUDY (A) 09/30/2017 1800   LABSPEC 1.015 09/30/2017 1800   PHURINE 6.0 09/30/2017 1800   GLUCOSEU NEGATIVE 09/30/2017 1800   HGBUR MODERATE (A) 09/30/2017 1800   BILIRUBINUR NEGATIVE 09/30/2017 1800   KETONESUR NEGATIVE 09/30/2017 1800   PROTEINUR 100 (A) 09/30/2017 1800   NITRITE NEGATIVE 09/30/2017 1800   LEUKOCYTESUR LARGE (A) 09/30/2017 1800   Sepsis Labs: @LABRCNTIP (procalcitonin:4,lacticidven:4)  ) Recent Results (from the past 240 hour(s))  Culture, blood (routine x 2) Call MD if unable to obtain prior to antibiotics being given     Status: None (Preliminary result)   Collection Time: 11/16/17  8:52  PM  Result Value Ref Range Status   Specimen Description   Final    BLOOD BLOOD RIGHT HAND Performed at [facility], [place] [address], [place], [place] [address]    Special Requests   Final    BOTTLES DRAWN AEROBIC ONLY Blood Culture results may not be optimal due to an inadequate volume of blood received in culture bottles Performed at [place] [address], [place], [place] [address]    Culture   Final    NO GROWTH 4 DAYS Performed at [facility], [place] [address], [place], [place] [address]    Report Status PENDING  Incomplete  Culture, blood (routine x 2) Call MD if unable to obtain prior to antibiotics being given     Status: None (Preliminary result)   Collection Time: 11/16/17  8:52 PM  Result Value Ref Range Status   Specimen Description  Final    BLOOD BLOOD LEFT HAND Performed at [place] [address], [place], [place] [address]    Special Requests   Final    BOTTLES DRAWN AEROBIC ONLY Blood Culture results may not be optimal due to an inadequate volume of blood received in culture bottles Performed at [place] [address], [place], [place] [address]    Culture   Final    NO GROWTH 4 DAYS Performed at [facility], [place] [address], [place], [place] [address]    Report Status PENDING  Incomplete  MRSA PCR Screening     Status: None   Collection Time: 11/18/17  1:10 PM  Result Value Ref Range Status   MRSA by PCR NEGATIVE NEGATIVE Final    Comment:        The GeneXpert MRSA Assay (FDA approved for NASAL specimens only), is one component of a comprehensive MRSA colonization surveillance program. It is not intended to diagnose MRSA infection nor to guide or monitor treatment for MRSA infections. Performed at [facility], [place] [address], [place], [place] [address]   Culture, respiratory (non-expectorated)     Status: None (Preliminary  result)   Collection Time: 11/18/17  4:20 PM  Result Value Ref Range Status   Specimen Description   Final    TRACHEAL ASPIRATE Performed at [place] [address] [place], [place] [address]    Special Requests   Final    NONE Performed at [facility], [place] [address] [place], [place] [address]    Gram Stain   Final    RARE WBC PRESENT, PREDOMINANTLY PMN RARE GRAM POSITIVE RODS RARE GRAM POSITIVE COCCI    Culture   Final    CULTURE REINCUBATED FOR BETTER GROWTH Performed at [facility], [place] [address], [place],  [address]    Report Status PENDING  Incomplete      Studies: Ir Replace G-tube Simple Wo Fluoro  Result Date: 11/20/2017 INDICATION: Chronic dysphagia requiring prolonged enteral nutrition via percutaneous gastrostomy tube. Gastrostomy tube originally placed in 2018, has been replaced multiple times. Request evaluation for persistent leakage. EXAM: REPLACEMENT OF GASTROSTOMY TUBE COMPARISON:  None. MEDICATIONS: None. CONTRAST:  None FLUOROSCOPY TIME:  None. COMPLICATIONS: None immediate. PROCEDURE: Informed written consent was obtained from the patient after a discussion of the risks, benefits and alternatives to treatment. Questions regarding the procedure were encouraged and answered. A timeout was performed prior to the initiation of the procedure. The upper abdomen and external portion of the existing gastrostomy tube was prepped and draped in the usual sterile fashion, and a sterile drape was applied covering the operative field. Maximum barrier sterile technique with sterile gowns and gloves were used for the procedure. A timeout was performed prior to the initiation of the procedure. The existing balloon retention gastrostomy tube was removed and exchanged for a new 20-French MIC balloon inflatable gastrostomy tube. The balloon was inflated and disc was cinched. The tube was flushed with tap water without difficulty.  A dressing was placed. The patient tolerated the procedure well without immediate postprocedural complication. IMPRESSION: Successful bedside replacement of a new 20-French gastrostomy tube. The new gastrostomy tube is ready for immediate use. Read by: Ascencion Dike PA-C Electronically Signed   By: Sandi Mariscal M.D.   On: 11/20/2017 14:15    Scheduled Meds: . acetaminophen  1,000 mg Per Tube TID  . aspirin  81 mg Per Tube  Daily  . chlorhexidine  15 mL Mouth Rinse BID  . cloNIDine  0.1 mg Transdermal Weekly  . clopidogrel  75 mg Per Tube Daily  . enoxaparin (LOVENOX) injection  40 mg Subcutaneous Q24H  . feeding supplement (PRO-STAT SUGAR FREE 64)  30 mL Oral QHS  . gabapentin  100 mg Per Tube Q8H  . glycopyrrolate  1 mg Per Tube BID  . ipratropium-albuterol  3 mL Nebulization QID  . mouth rinse  15 mL Mouth Rinse q12n4p  . polyethylene glycol  17 g Per Tube BID  . ranitidine  150 mg Per Tube Daily  . thiamine  100 mg Per Tube Daily    Continuous Infusions: . sodium chloride Stopped (11/21/17 0311)  . ampicillin-sulbactam (UNASYN) IV Stopped (11/21/17 0954)  . feeding supplement (JEVITY 1.2 CAL) 1,000 mL (11/21/17 0925)     LOS: 5 days     Kayleen Memos, MD [facility] Pager [phone]  If 7PM-7AM, please contact night-coverage [email or web address] Password [email or web address] 11/21/2017, 12:45 PM

## 2017-11-21 NOTE — Progress Notes (Signed)
Patient has been very irritable, noncompliant and combative during all care given tonight so far. Currently, he will not be able to lie still for a CT pelvis with contrast. Gaylyn Rong in CT to inform her of this. Continue to monitor.

## 2017-11-21 NOTE — Progress Notes (Signed)
Patient refused his gabapentin. When I asked him if he would like it, he nodded his head no and then started swinging.

## 2017-11-22 ENCOUNTER — Encounter (HOSPITAL_COMMUNITY): Payer: Self-pay | Admitting: *Deleted

## 2017-11-22 LAB — BASIC METABOLIC PANEL
Anion gap: 9 (ref 5–15)
BUN: 19 mg/dL (ref 8–23)
CO2: 29 mmol/L (ref 22–32)
Calcium: 9.5 mg/dL (ref 8.9–10.3)
Chloride: 107 mmol/L (ref 98–111)
Creatinine, Ser: 0.74 mg/dL (ref 0.61–1.24)
GFR calc Af Amer: >60 mL/min (ref >60)
GFR calc non Af Amer: >60 mL/min (ref >60)
Glucose, Bld: 109 mg/dL — ABNORMAL HIGH (ref 70–99)
Potassium: 4 mmol/L (ref 3.5–5.1)
Sodium: 145 mmol/L (ref 135–145)

## 2017-11-22 LAB — CBC
HCT: 43 % (ref 39.0–52.0)
Hemoglobin: 13.6 g/dL (ref 13.0–17.0)
MCH: 30 pg (ref 26.0–34.0)
MCHC: 31.6 g/dL (ref 30.0–36.0)
MCV: 94.7 fL (ref 78.0–100.0)
Platelets: 313 10*3/uL (ref 150–400)
RBC: 4.54 MIL/uL (ref 4.22–5.81)
RDW: 14.9 % (ref 11.5–15.5)
WBC: 8.1 10*3/uL (ref 4.0–10.5)

## 2017-11-22 LAB — GLUCOSE, CAPILLARY
Glucose-Capillary: 106 mg/dL — ABNORMAL HIGH (ref 70–99)
Glucose-Capillary: 111 mg/dL — ABNORMAL HIGH (ref 70–99)
Glucose-Capillary: 118 mg/dL — ABNORMAL HIGH (ref 70–99)
Glucose-Capillary: 132 mg/dL — ABNORMAL HIGH (ref 70–99)

## 2017-11-22 MED ORDER — AMLODIPINE BESYLATE 10 MG PO TABS: 10.0000 mg | ORAL_TABLET | Freq: Every day | ORAL | Status: DC

## 2017-11-22 MED ORDER — JEVITY 1.2 CAL PO LIQD: 1000.0000 mL | ORAL | 0 refills | Status: DC

## 2017-11-22 MED ORDER — AMOXICILLIN-POT CLAVULANATE 600-42.9 MG/5ML PO SUSR: 600.0000 mg | Freq: Two times a day (BID) | ORAL | 0 refills | Status: AC

## 2017-11-22 MED ORDER — GLYCOPYRROLATE 1 MG PO TABS: 1.0000 mg | ORAL_TABLET | Freq: Two times a day (BID) | ORAL | 0 refills | Status: DC

## 2017-11-22 MED ORDER — FREE WATER
50.0000 mL | Freq: Three times a day (TID) | Status: DC
  Administered 2017-11-22: 50 mL

## 2017-11-22 MED ORDER — RANITIDINE HCL 150 MG/10ML PO SYRP: 150.0000 mg | ORAL_SOLUTION | Freq: Every day | ORAL | 0 refills | Status: DC

## 2017-11-22 MED ORDER — AMLODIPINE BESYLATE 10 MG PO TABS: 10.0000 mg | ORAL_TABLET | Freq: Every day | ORAL | 0 refills | Status: AC

## 2017-11-22 MED ORDER — AMOXICILLIN-POT CLAVULANATE 600-42.9 MG/5ML PO SUSR
600.0000 mg | Freq: Two times a day (BID) | ORAL | Status: DC
  Filled 2017-11-22: qty 5

## 2017-11-22 MED ORDER — AMLODIPINE BESYLATE 10 MG PO TABS
10.0000 mg | ORAL_TABLET | Freq: Every day | ORAL | Status: DC
  Administered 2017-11-22: 10 mg
  Filled 2017-11-22: qty 1

## 2017-11-22 NOTE — Progress Notes (Signed)
Patient discharge information provided to sister, [name] (primary caregiver), prior to her leaving. Opportunity for questions provided, no questions asked.  PIV removed.  Patient DC'd and transported by [facility]. [name] made aware that patient was leaving the hospital.

## 2017-11-22 NOTE — Care Management Note (Signed)
Case Management Note  Patient Details  Name: Scott Rivera MRN: [id number] Date of Birth: 1950/12/09  Subjective/Objective:      Discharge needs               Action/Plan:  rn through [facility] , pot and ot and enteral feeds through [facility]/tct-md-[facility]-will reorder hhc services. Pt scheduled to return to the home.   Expected Discharge Date:  11/22/17               Expected Discharge Plan:  [place]  In-House Referral:     Discharge planning Services  CM Consult  Post Acute Care Choice:    Choice offered to:     DME Arranged:    DME Agency:     HH Arranged:  RN, PT, OT [place] Agency:  Other - See comment, [place]  Status of Service:  In process, will continue to follow  If discussed at Long Length of Stay Meetings, dates discussed:    Additional Comments:  Leeroy Cha, RN 11/22/2017, 1:10 PM

## 2017-11-22 NOTE — Progress Notes (Signed)
Nutrition Follow-up  DOCUMENTATION CODES:   Non-severe (moderate) malnutrition in context of chronic illness  INTERVENTION:  - Continue current TF regimen.  - Will order 50 mL free water via PEG TID (150 mL/day).   NUTRITION DIAGNOSIS:   Moderate Malnutrition related to chronic illness, dysphagia(aspiration pneumonia) as evidenced by moderate fat depletion, moderate muscle depletion. -ongoing  GOAL:   Patient will meet greater than or equal to 90% of their needs -   MONITOR:   Labs, Weight trends, I & O's, TF tolerance  ASSESSMENT:   67 y.o. 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   Weight has been mainly stable throughout hospitalization. Patient sleeping at this time with no family/visitors at bedside. Patient with trach, on trach collar, and PEG. Order in place for Jevity 1.2 @ 60 mL/hr with 30 mL Prostat once/day. This regimen provides 1828 kcal, 95 grams of protein, and 1162 mL free water.   Per Dr. Juel Burrow note yesterday afternoon: acute hypoxic respiratory failure 2/2 aspiration PNA with trach collar in place, accelerated HTN with antihypertensive via PEG, Palliative has been consulted for [place] discussion.   Medications reviewed; 1 packet Miralax BID, 100 mg thiamine per PEG/day.  Labs reviewed; CBGs: 132, 111, and 118 mg/dL today.        Diet Order:   Diet Order            Diet NPO time specified  Diet effective now              EDUCATION NEEDS:   Not appropriate for education at this time  Skin:  Skin Assessment: Reviewed RN Assessment  Last BM:  8/7  Height:   Ht Readings from Last 1 Encounters:  11/18/17 6' (1.829 m)    Weight:   Wt Readings from Last 1  Encounters:  11/22/17 71.7 kg    Ideal Body Weight:  80.9 kg  BMI:  Body mass index is 21.44 kg/m.  Estimated Nutritional Needs:   Kcal:  1287-8676  Protein:  95-105g  Fluid:  2L/day     Jarome Matin, MS, RD, LDN, CNSC Inpatient Clinical Dietitian Pager # [phone] After hours/weekend pager # [phone]

## 2017-11-22 NOTE — Care Management Note (Signed)
Case Management Note  Patient Details  Name: Scott Rivera MRN: [id number] Date of Birth: [birth year]  Subjective/Objective:       Discharge planning has rn at home through [facility].  Has Trach Collar at home on 28% o2/lives with family.             Action/Plan: Will notify rn agency of discharge and needs  Expected Discharge Date:  (unknown)               Expected Discharge Plan:  [place]  In-House Referral:     Discharge planning Services  CM Consult  Post Acute Care Choice:    Choice offered to:     DME Arranged:    DME Agency:     HH Arranged:  RN [place] Agency:  Other - See comment  Status of Service:  In process, will continue to follow  If discussed at Long Length of Stay Meetings, dates discussed:    Additional Comments:  Leeroy Cha, RN 11/22/2017, 9:09 AM

## 2017-11-22 NOTE — Discharge Instructions (Addendum)
Healthcare-Associated Pneumonia Healthcare-associated pneumonia is a lung infection that a person can get when in a health care setting or during certain procedures. The infection causes air sacs inside the lungs to fill with pus or fluid. Healthcare-associated pneumonia is usually caused by bacteria that are common in health care settings. These bacteria may be resistant to some antibiotic medicines. What are the causes? This condition is caused by bacteria that get into your lungs. You can get this condition if you:  Breathe in droplets from an infected person's cough or sneeze.  Touch something that an infected person coughed or sneezed on and then touch your mouth, nose, or eyes.  Have a bacterial infection somewhere else in your body, if the bacteria spread to your lungs through your blood.  What increases the risk? This condition is more likely to develop in people who:  Have a disease that weakens their body's defense system (immune system) or their ability to cough out germs.  Are older than age 67.  Having trouble swallowing.  Use a feeding or breathing tube.  Have a cold or the flu.  Have an IV tube inserted in a vein.  Have surgery.  Have a bed sore.  Live in a long-term care facility, such as a nursing home.  Were in the hospital for two or more days in the past 3 months.  Received hemodialysis in the past 30 days.  What are the signs or symptoms? Symptoms of this condition include:  Fever.  Chills.  Cough.  Shortness of breath.  Wheezing or crackling sounds when breathing.  How is this diagnosed? This condition may be diagnosed based on:  Your symptoms.  A chest X-ray.  A measurement of the amount of oxygen in your blood.  How is this treated? This condition is treated with antibiotics. Your health care provider may take a sample of cells (culture) from your throat to determine what type of bacteria is in your lungs and change your  antibiotic based on the results. If you have bacteria in your blood, trouble breathing, or a low oxygen level, you may need to be treated at the hospital. At the hospital, you will be given antibiotics through an IV tube. You may also be given oxygen or breathing treatments. Follow these instructions at home: Medicine  Take your antibiotic medicine as told by your health care provider. Do not stop taking the antibiotic even if you start to feel better.  Take over-the-counter and other prescription medicines only as told by your health care provider. Activity  Rest at home until you feel better.  Return to your normal activities as told by your health care provider. Ask your health care provider what activities are safe for you. General instructions  Drink enough fluid to keep your urine clear or pale yellow.  Do not use any products that contain nicotine or tobacco, such as cigarettes and e-cigarettes. If you need help quitting, ask your health care provider.  Limit alcohol intake to no more than 1 drink per day for nonpregnant women and 2 drinks per day for men. One drink equals 12 oz of beer, 5 oz of wine, or 1 oz of hard liquor.  Keep all follow-up visits as told by your health care provider. This is important. How is this prevented? Actions that I can take To lower your risk of getting this condition again:  Do not smoke. This includes e-cigarettes.  Do not drink too much alcohol.  Keep your immune system  healthy by eating well and getting enough sleep.  Get a flu shot every year (annually).  Get a pneumonia vaccination if: ? You are older than age 67. ? You smoke. ? You have a long-lasting condition like lung disease.  Exercise your lungs by taking deep breaths, walking, and using an incentive spirometer as directed.  Wash your hands often with soap and water. If you cannot get to a sink to wash your hands, use an alcohol-based hand cleaner.  Make sure your health care  providers are washing their hands. If you do not see them wash their hands, ask them to do so.  When you are in a health care facility, avoid touching your eyes, nose, and mouth.  Avoid touching any surface near where people have coughed or sneezed.  Stand away from sick people when they are coughing or sneezing.  Wear a mask if you cannot avoid exposure to people who are sick.  Clean all surfaces often with a disinfectant cleaner, especially if someone is sick at home or work.  Precautions of my health care team Hospitals, nursing homes, and other health care facilities take special care to try to prevent healthcare-associated pneumonia. To do this, your health care team may:  Clean their hands with soap and water or with alcohol-based hand sanitizer before and after seeing patients.  Wear gloves or masks during treatment.  Sanitize medical instruments, tubes, other equipment, and surfaces in patient rooms.  Raise (elevate) the head of your hospital bed so you are not lying flat. The head of the bed may be elevated 30 degrees or more.  Have you sit up and move around as soon as possible after surgery.  Only insert a breathing tube if needed.  Do these things for you if you have a breathing tube: ? Clean the inside of your mouth regularly. ? Remove the breathing tube as soon as it is no longer needed.  Contact a health care provider if:  Your symptoms do not get better or they get worse.  Your symptoms come back after you have finished taking your antibiotics. Get help right away if:  You have trouble breathing.  You have confusion or difficulty thinking. This information is not intended to replace advice given to you by your health care provider. Make sure you discuss any questions you have with your health care provider. Document Released: 08/24/2015 Document Revised: 01/18/2016 Document Reviewed: 12/31/2015 Elsevier Interactive Patient Education  2018 [employer]. Nausea and Vomiting, Adult Feeling sick to your stomach (nausea) means that your stomach is upset or you feel like you have to throw up (vomit). Feeling more and more sick to your stomach can lead to throwing up. Throwing up happens when food and liquid from your stomach are thrown up and out the mouth. Throwing up can make you feel weak and cause you to get dehydrated. Dehydration can make you tired and thirsty, make you have a dry mouth, and make it so you pee (urinate) less often. Older adults and people with other diseases or a weak defense system (immune system) are at higher risk for dehydration. If you feel sick to your stomach or if you throw up, it is important to follow instructions from your doctor about how to take care of yourself. Follow these instructions at home: Eating and drinking Follow these instructions as told by your doctor:  Take an oral rehydration solution (ORS). This is a drink that is sold at pharmacies and stores.  Drink  clear fluids in small amounts as you are able, such as: ? Water. ? Ice chips. ? Diluted fruit juice. ? Low-calorie sports drinks.  Eat bland, easy-to-digest foods in small amounts as you are able, such as: ? Bananas. ? Applesauce. ? Rice. ? Low-fat (lean) meats. ? Toast. ? Crackers.  Avoid fluids that have a lot of sugar or caffeine in them.  Avoid alcohol.  Avoid spicy or fatty foods.  General instructions  Drink enough fluid to keep your pee (urine) clear or pale yellow.  Wash your hands often. If you cannot use soap and water, use hand sanitizer.  Make sure that all people in your home wash their hands well and often.  Take over-the-counter and prescription medicines only as told by your doctor.  Rest at home while you get better.  Watch your condition for any changes.  Breathe slowly and deeply when you feel sick to your stomach.  Keep all follow-up visits as told by your doctor. This is important. Contact a doctor  if:  You have a fever.  You cannot keep fluids down.  Your symptoms get worse.  You have new symptoms.  You feel sick to your stomach for more than two days.  You feel light-headed or dizzy.  You have a headache.  You have muscle cramps. Get help right away if:  You have pain in your chest, neck, arm, or jaw.  You feel very weak or you pass out (faint).  You throw up again and again.  You see blood in your throw-up.  Your throw-up looks like black coffee grounds.  You have bloody or black poop (stools) or poop that look like tar.  You have a very bad headache, a stiff neck, or both.  You have a rash.  You have very bad pain, cramping, or bloating in your belly (abdomen).  You have trouble breathing.  You are breathing very quickly.  Your heart is beating very quickly.  Your skin feels cold and clammy.  You feel confused.  You have pain when you pee.  You have signs of dehydration, such as: ? Dark pee, hardly any pee, or no pee. ? Cracked lips. ? Dry mouth. ? Sunken eyes. ? Sleepiness. ? Weakness. These symptoms may be an emergency. Do not wait to see if the symptoms will go away. Get medical help right away. Call your local emergency services (911 in the [place] Do not drive yourself to the hospital. This information is not intended to replace advice given to you by your health care provider. Make sure you discuss any questions you have with your health care provider. Document Released: 09/20/2007 Document Revised: 10/22/2015 Document Reviewed: 12/08/2014 Elsevier Interactive Patient Education  2018 [employer].

## 2017-11-22 NOTE — Discharge Summary (Signed)
Discharge Summary  Scott Rivera YIR:[id number] DOB: 1950-08-12  PCP: Jamesetta Orleans, PA-C  Admit date: 11/16/2017 Discharge date: 11/22/2017  Time spent: 25 minutes  Recommendations for Outpatient Follow-up:  1. Follow-up with your PCP 2. Take your medications as prescribed 3. Fall precautions  Discharge Diagnoses:  Active Hospital Problems   Diagnosis Date Noted  . Aspiration pneumonia ([place]) 11/16/2017  . Malnutrition of moderate degree 11/19/2017  . Nausea and vomiting 11/16/2017  . S/P percutaneous endoscopic gastrostomy (PEG) tube placement ([place]) 03/30/2017  . H/O tracheostomy   . Anemia due to chronic kidney disease 12/09/2016  . Aspiration into airway   . Benign essential HTN   . Chronic kidney disease   . History of completed stroke 10/19/2016  . Bilateral chronic knee pain 01/07/2016    Resolved Hospital Problems  No resolved problems to display.    Discharge Condition: Stable  Diet recommendation: Continue PEG tube feeding  Vitals:   11/22/17 1100 11/22/17 1101  BP: (!) 156/85   Pulse: 93   Resp: 19   Temp:    SpO2: 97% 97%    History of present illness:  Pt. with PMH of CVA with aphasia, PEG tube placement, tracheostomy placement, deconditioning with protein calorie malnutrition, recurrent aspiration pneumonia; admitted on8/05/2017, presented with complaint of cough and shortness of breath, was found to have aspiration pneumonia secondary to vomiting from constipation. Currently further plan is continueIV antibiotics and monitor tolerance of tube feeding  11/21/2017: Patient seen and examined at his bedside is essentially nonverbal.  However he is able to answer to simple questions by nodding yes or no.  Denies pain or shortness of breath.    11/22/2017: Patient seen and examined at his bedside.  He has no new complaints.  On the day of discharge, the patient was hemodynamically stable.  He will need to follow-up with his primary care provider post  hospitalization.  Hospital Course:  Principal Problem:   Aspiration pneumonia ([place]) Active Problems:   History of completed stroke   Benign essential HTN   Chronic kidney disease   Aspiration into airway   Bilateral chronic knee pain   Anemia due to chronic kidney disease   H/O tracheostomy   S/P percutaneous endoscopic gastrostomy (PEG) tube placement ([facility])   Nausea and vomiting   Malnutrition of moderate degree  Acute hypoxic respiratory failure secondary to aspiration pneumonia vs HCAP Trach collar in place Sputum cx positive for pseudomonas aeruginosa, acinetobacter, stenotrophomonas, multidrug resistant- Intermediate unasyn Completed IV antibiotics Unasyn. Total 7 days IV antibiotics. Afebrile and no loeukocytosis Start Augmentin BID for 5 days.  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 General: 67 y.o. year-old male frail.  Alert but nonverbal.  Tracheostomy tube in place.   . Cardiovascular: Regular rate and rhythm with no rubs or gallops.  No thyromegaly or JVD noted.   Marland Kitchen Respiratory: Clear to auscultation with no wheezes or rales. Good inspiratory effort. . Abdomen: Soft nontender  nondistended with normal bowel sounds x4 quadrants. . Musculoskeletal: Significant swelling affecting both knees.  Nontender on palpation with no warmth or erythema. Marland Kitchen Psychiatry: Mood is appropriate for condition and setting  Discharge Instructions You were cared for by a hospitalist during your  hospital stay. If you have any questions about your discharge medications or the care you received while you were in the hospital after you are discharged, you can call the unit and asked to speak with the hospitalist on call if the hospitalist that took care of you is not available. Once you are discharged, your primary care physician will handle any further medical issues. Please note that NO REFILLS for any discharge medications will be authorized once you are discharged, as it is imperative that you return to your primary care physician (or establish a relationship with a primary care physician if you do not have one) for your aftercare needs so that they can reassess your need for medications and monitor your lab values.   Allergies as of 11/22/2017   No Known Allergies     Medication List    STOP taking these medications   famotidine 40 MG/5ML suspension Commonly known as:  PEPCID   polyethylene glycol packet Commonly known as:  MIRALAX / GLYCOLAX     TAKE these medications   acetaminophen 500 MG tablet Commonly known as:  TYLENOL Place 1,000 mg into feeding tube 3 (three) times daily.   amLODipine 10 MG tablet Commonly known as:  NORVASC Place 1 tablet (10 mg total) into feeding tube daily. Start taking on:  11/23/2017   amoxicillin-clavulanate 600-42.9 MG/5ML suspension Commonly known as:  AUGMENTIN Place 5 mLs (600 mg total) into feeding tube 2 (two) times daily for 5 days.   aspirin 81 MG chewable tablet Place 1 tablet (81 mg total) into feeding tube daily. What changed:  how much to take   atropine 1 % ophthalmic solution Place 2 drops under the tongue every 8 (eight) hours as needed (excessive secretion).   cloNIDine 0.1 mg/24hr patch Commonly known as:  CATAPRES - Dosed in mg/24 hr Place 1 patch (0.1 mg total) onto the skin once a week. What changed:  when to take this   clopidogrel 75 MG tablet Commonly known as:  PLAVIX Place 1 tablet (75 mg total) into feeding  tube daily.   feeding supplement (JEVITY 1.2 CAL) Liqd Place 1,000 mLs into feeding tube continuous.   feeding supplement (PRO-STAT SUGAR FREE 64) Liqd Take 30 mLs by mouth at bedtime.   free water Soln Place 100 mLs into feeding tube daily. What changed:  when to take this   gabapentin 100 MG capsule Commonly known as:  NEURONTIN Take 100 mg See admin instructions by mouth. Open 1 capsule (100 mg) and administer contents via feeding tube three times daily   glycopyrrolate 1 MG tablet Commonly known as:  ROBINUL Place 1 tablet (1 mg total) into feeding tube 2 (two) times daily.   ipratropium-albuterol 0.5-2.5 (3) MG/3ML Soln Commonly known as:  DUONEB Take 3 mLs by nebulization every 4 (four) hours as needed. What changed:  reasons to take this   montelukast 10 MG tablet Commonly known as:  SINGULAIR Take 10 mg by mouth at bedtime.   mouth rinse Liqd solution 10 mLs by Mouth Rinse route 4 (four) times daily.   pravastatin 20 MG tablet Commonly known as:  PRAVACHOL Place 1 tablet (20 mg total) into feeding tube daily at 6 PM. What changed:  when  to take this   ranitidine 150 MG/10ML syrup Commonly known as:  ZANTAC Place 10 mLs (150 mg total) into feeding tube daily. Start taking on:  11/23/2017   thiamine 100 MG tablet Place 1 tablet (100 mg total) into feeding tube daily.      No Known Allergies Follow-up Information    Jamesetta Orleans, PA-C. Call in 1 day(s).   Specialty:  Internal Medicine Why:  Please call for an appointment. Contact information: [place] [address] [phone]            The results of significant diagnostics from this hospitalization (including imaging, microbiology, ancillary and laboratory) are listed below for reference.    Significant Diagnostic Studies: Dg Chest 2 View  Result Date: 11/16/2017 CLINICAL DATA:  Spitting up brown mucus. EXAM: CHEST - 2 VIEW COMPARISON:  09/30/2017. FINDINGS: Heart size upper  limits normal. Diffuse BILATERAL pulmonary opacities, lower lobe predominant; concern for pneumonia. Tracheostomy in good position. Surgical clips lung LEFT apex. No osseous findings. IMPRESSION: BILATERAL pulmonary opacities represent worsening aeration, concern for pneumonia raised. Tracheostomy good position. Unchanged heart size. Electronically Signed   By: Staci Righter M.D.   On: 11/16/2017 18:02   Ct Pelvis W Contrast  Result Date: 11/21/2017 CLINICAL DATA:  67 year old male with a history of possible anal fissure or fistula EXAM: CT PELVIS WITH CONTRAST TECHNIQUE: Multidetector CT imaging of the pelvis was performed using the standard protocol following the bolus administration of intravenous contrast. CONTRAST:  170mL OMNIPAQUE IOHEXOL 300 MG/ML SOLN, 68mL ISOVUE-300 IOPAMIDOL (ISOVUE-300) INJECTION 61% COMPARISON:  06/20/2017 FINDINGS: Urinary Tract: Balloon retention urinary catheter within urinary bladder which is decompressed. Bowel: Compared to the prior CT scan of 06/20/2017 there has been significant decreased volume of formed stool within the rectum. No distention of the visualized colon or small bowel. Colonic diverticula are present without associated inflammation. The bilateral ischial rectal fossa are unremarkable with no focal inflammation or fluid. Unremarkable appearance of the perirectal fat and presacral fat. No evidence of perforation. Normal appendix is visualized. Vascular/Lymphatic: Atherosclerotic calcifications of the distal aorta and bilateral iliac arteries. Calcifications of the bilateral common femoral arteries. Common femoral arteries remain patent. Reproductive:  Unremarkable appearance of the prostate. Other: Surgical changes of the midline abdomen. Bilateral fat containing inguinal hernia. Musculoskeletal: Osteopenia. No displaced fracture. AVN of the left femoral head. Advanced degenerative changes of the right femoral head IMPRESSION: No acute CT finding. Significant  reduction in formed stool of the visualized colon, with no evidence of bowel perforation, obstruction, perirectal abscess. Aortic Atherosclerosis (ICD10-I70.0). Electronically Signed   By: Corrie Mckusick D.O.   On: 11/21/2017 20:06   Ir Replace G-tube Simple Wo Fluoro  Result Date: 11/20/2017 INDICATION: Chronic dysphagia requiring prolonged enteral nutrition via percutaneous gastrostomy tube. Gastrostomy tube originally placed in 2018, has been replaced multiple times. Request evaluation for persistent leakage. EXAM: REPLACEMENT OF GASTROSTOMY TUBE COMPARISON:  None. MEDICATIONS: None. CONTRAST:  None FLUOROSCOPY TIME:  None. COMPLICATIONS: None immediate. PROCEDURE: Informed written consent was obtained from the patient after a discussion of the risks, benefits and alternatives to treatment. Questions regarding the procedure were encouraged and answered. A timeout was performed prior to the initiation of the procedure. The upper abdomen and external portion of the existing gastrostomy tube was prepped and draped in the usual sterile fashion, and a sterile drape was applied covering the operative field. Maximum barrier sterile technique with sterile gowns and gloves were used for the procedure. A timeout  was performed prior to the initiation of the procedure. The existing balloon retention gastrostomy tube was removed and exchanged for a new 20-French MIC balloon inflatable gastrostomy tube. The balloon was inflated and disc was cinched. The tube was flushed with tap water without difficulty. A dressing was placed. The patient tolerated the procedure well without immediate postprocedural complication. IMPRESSION: Successful bedside replacement of a new 20-French gastrostomy tube. The new gastrostomy tube is ready for immediate use. Read by: Ascencion Dike PA-C Electronically Signed   By: Sandi Mariscal M.D.   On: 11/20/2017 14:15   Dg Chest Port 1 View  Result Date: 11/20/2017 CLINICAL DATA:  Increased chest  congestion. Increased secretions from tracheostomy tube. EXAM: PORTABLE CHEST 1 VIEW COMPARISON:  Chest x-ray of November 18, 2017 FINDINGS: There is persistent infiltrate in the left mid mid and lower lung. The right lung exhibits no infiltrate. The interstitial markings are coarse though stable. The heart is normal in size. The pulmonary vascularity is not clearly engorged. The observed bony thorax exhibits no acute abnormality. IMPRESSION: Persistent left-sided pneumonia not greatly changed over the past 2 days. Electronically Signed   By: David  Martinique M.D.   On: 11/20/2017 09:50   Dg Chest Port 1 View  Result Date: 11/18/2017 CLINICAL DATA:  Follow-up tracheostomy tube EXAM: PORTABLE CHEST 1 VIEW COMPARISON:  09/30/2017 FINDINGS: Cardiac shadow is stable. Tracheostomy tube is again noted. The right lung is clear. Patchy infiltrate is noted in the left mid lung. No bony abnormality is seen. IMPRESSION: New patchy left lung infiltrate. Electronically Signed   By: Inez Catalina M.D.   On: 11/18/2017 15:35   Dg Abd 2 Views  Result Date: 11/16/2017 CLINICAL DATA:  Abdominal pain.  Vomiting. EXAM: ABDOMEN - 2 VIEW COMPARISON:  None. FINDINGS: There is no visible bowel obstruction or free air. However there is massive stool burden in the rectal vault, as well as the RIGHT colon and transverse colon consistent with extensive constipation. Surgical clips mid abdomen. No radiopaque calculi are observed. Vascular calcification is noted. There is degenerative change both hips. IMPRESSION: Extensive constipation.  No acute findings. Electronically Signed   By: Staci Righter M.D.   On: 11/16/2017 18:03   Dg Abd Portable 1v  Result Date: 11/20/2017 CLINICAL DATA:  Constipation. EXAM: PORTABLE ABDOMEN - 1 VIEW COMPARISON:  Abdominal radiograph of November 18, 2017 FINDINGS: The colonic stool burden remains increased. There is slightly increased rectal stool burden. A gastrostomy tube is present. There are surgical skin  staples just to the left of midline. There are degenerative changes of the lumbar spine. 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 IMPRESSION: Persistently increased colonic stool burden possibly slightly decreased since the previous study. Mild increase in rectal stool burden may reflect a fecal impaction. Electronically Signed   By: David  Martinique M.D.   On: 11/20/2017 09:47   Dg Abd Portable 1v  Result Date: 11/18/2017 CLINICAL DATA:  Constipation EXAM: PORTABLE ABDOMEN - 1 VIEW COMPARISON:  11/16/2017 FINDINGS: Scattered large and small bowel gas is noted. Constipation is again identified and stable. No free air is seen. Degenerative changes of the lumbar spine and hip joints are noted. IMPRESSION: Changes of constipation stable from the prior study. Electronically Signed   By: Inez Catalina M.D.   On: 11/18/2017 15:36    Microbiology: Recent Results (from the past 240 hour(s))  Culture, blood (routine x 2) Call MD if unable to obtain prior to antibiotics  being given     Status: None   Collection Time: 11/16/17  8:52 PM  Result Value Ref Range Status   Specimen Description   Final    BLOOD BLOOD RIGHT HAND Performed at [facility], [place] [address], [place], [place] [address]    Special Requests   Final    BOTTLES DRAWN AEROBIC ONLY Blood Culture results may not be optimal due to an inadequate volume of blood received in culture bottles Performed at [place] [address], [place], [place] [address]    Culture   Final    NO GROWTH 5 DAYS Performed at [facility], [place] [address] [place], [place] [address]    Report Status 11/21/2017 FINAL  Final  Culture, blood (routine x 2) Call MD if unable to obtain prior to antibiotics being given     Status: None   Collection Time: 11/16/17  8:52 PM  Result Value Ref Range Status   Specimen Description   Final    BLOOD BLOOD LEFT HAND Performed at  [place] [address] [place], [place] [address]    Special Requests   Final    BOTTLES DRAWN AEROBIC ONLY Blood Culture results may not be optimal due to an inadequate volume of blood received in culture bottles Performed at [place] [address] [place], [place] [address]    Culture   Final    NO GROWTH 5 DAYS Performed at [facility], [place] [address], [place], [place] [address]    Report Status 11/21/2017 FINAL  Final  MRSA PCR Screening     Status: None   Collection Time: 11/18/17  1:10 PM  Result Value Ref Range Status   MRSA by PCR NEGATIVE NEGATIVE Final    Comment:        The GeneXpert MRSA Assay (FDA approved for NASAL specimens only), is one component of a comprehensive MRSA colonization surveillance program. It is not intended to diagnose MRSA infection nor to guide or monitor treatment for MRSA infections. Performed at [facility], [place] [address], [place], [place] [address]   Culture, respiratory (non-expectorated)     Status: Abnormal (Preliminary result)   Collection Time: 11/18/17  4:20 PM  Result Value Ref Range Status   Specimen Description   Final    TRACHEAL ASPIRATE Performed at [place] [address], [place], [place] [address]    Special Requests   Final    NONE Performed at [facility], [place] [address], [place], [place] [address]    Gram Stain   Final    RARE WBC PRESENT, PREDOMINANTLY PMN RARE GRAM POSITIVE RODS RARE GRAM POSITIVE COCCI Performed at [facility], [place] [address], [place], [place] [address]    Culture (A)  Final    PSEUDOMONAS AERUGINOSA SUSCEPTIBILITIES TO FOLLOW ACINETOBACTER CALCOACETICUS/BAUMANNII COMPLEX STENOTROPHOMONAS MALTOPHILIA MULTI-DRUG RESISTANT ORGANISM    Report Status PENDING  Incomplete   Organism ID, Bacteria ACINETOBACTER CALCOACETICUS/BAUMANNII COMPLEX  Final   Organism ID,  Bacteria STENOTROPHOMONAS MALTOPHILIA  Final      Susceptibility   Acinetobacter calcoaceticus/baumannii complex - MIC*    CEFTAZIDIME 16 INTERMEDIATE Intermediate     CEFTRIAXONE 32 INTERMEDIATE Intermediate     CIPROFLOXACIN >=4 RESISTANT Resistant     GENTAMICIN >=16 RESISTANT Resistant     IMIPENEM >=16 RESISTANT Resistant     PIP/TAZO >=128 RESISTANT Resistant  TRIMETH/SULFA >=320 RESISTANT Resistant     CEFEPIME 32 RESISTANT Resistant     AMPICILLIN/SULBACTAM 16 INTERMEDIATE Intermediate     * ACINETOBACTER CALCOACETICUS/BAUMANNII COMPLEX   Stenotrophomonas maltophilia - MIC*    LEVOFLOXACIN >=8 RESISTANT Resistant     TRIMETH/SULFA >=320 RESISTANT Resistant     * STENOTROPHOMONAS MALTOPHILIA  Body fluid culture     Status: None (Preliminary result)   Collection Time: 11/21/17  3:50 PM  Result Value Ref Range Status   Specimen Description   Final    ABDOMEN Performed at [place] [address], [place], [place] [address]    Special Requests   Final    Normal Performed at [facility], [place] [address], [place], [place] [address]    Gram Stain   Final    FEW WBC PRESENT, PREDOMINANTLY PMN MODERATE YEAST    Culture   Final    CULTURE REINCUBATED FOR BETTER GROWTH Performed at [facility], [place] [address], [place],  [address]    Report Status PENDING  Incomplete     Labs: Basic Metabolic Panel: Recent Labs  Lab 11/18/17 0448 11/19/17 0818 11/20/17 0312 11/21/17 0308 11/22/17 0657  NA 146* 147* 149* 149* 145  K 4.8 4.8 4.1 3.8 4.0  CL 104 104 110 110 107  CO2 30 34* 32 31 29  GLUCOSE 96 65* 103* 116* 109*  BUN 38* 34* 26* 20 19  CREATININE 1.43* 1.10 0.94 0.74 0.74  CALCIUM 10.0 9.9 9.5 9.9 9.5  MG  --  2.2  --   --   --    Liver Function Tests: Recent Labs  Lab 11/16/17 1642 11/19/17 0818 11/20/17 0312 11/21/17 0308  AST 24 18 15 20   ALT 22 15 13 15   ALKPHOS 107 75 70 83  BILITOT 0.3 1.1  0.9 0.7  PROT 8.9* 7.9 6.8 7.4  ALBUMIN 3.5 2.8* 2.6* 2.9*   Recent Labs  Lab 11/16/17 1642  LIPASE 22   No results for input(s): AMMONIA in the last 168 hours. CBC: Recent Labs  Lab 11/17/17 0502 11/18/17 0448 11/19/17 0818 11/20/17 0312 11/21/17 0308 11/22/17 0657  WBC 12.2* 30.3* 14.4* 12.4* 11.2* 8.1  NEUTROABS 8.6* 25.2* 11.1 8.9* 7.3  --   HGB 15.8 15.3 15.9 12.4* 13.4 13.6  HCT 50.9 49.7 52.0 40.6 43.5 43.0  MCV 97.1 97.8 97.2 97.6 96.2 94.7  PLT 323 296 186 278 313 313   Cardiac Enzymes: No results for input(s): CKTOTAL, CKMB, CKMBINDEX, TROPONINI in the last 168 hours. BNP: BNP (last 3 results) Recent Labs    03/30/17 1302 06/29/17 0737 08/17/17 1439  BNP 18.0 10.5 13.2    ProBNP (last 3 results) No results for input(s): PROBNP in the last 8760 hours.  CBG: Recent Labs  Lab 11/21/17 1554 11/21/17 1922 11/22/17 0008 11/22/17 0313 11/22/17 0735  GLUCAP 130* 77 132* 111* 118*       Signed:  Kayleen Memos, MD [facility] 11/22/2017, 12:06 PM

## 2017-11-23 LAB — CULTURE, RESPIRATORY W GRAM STAIN

## 2017-11-26 LAB — BODY FLUID CULTURE: Special Requests: NORMAL

## 2017-11-28 IMAGING — DX DG CHEST 1V PORT
1 series · 1 of 1 positions shown · non-contrast
Comparison: Frontal and lateral views earlier this day. Radiographs
yesterday and 10/15/2016

CLINICAL DATA: Shortness of breath.

EXAM:
PORTABLE CHEST 1 VIEW

[chest ap]
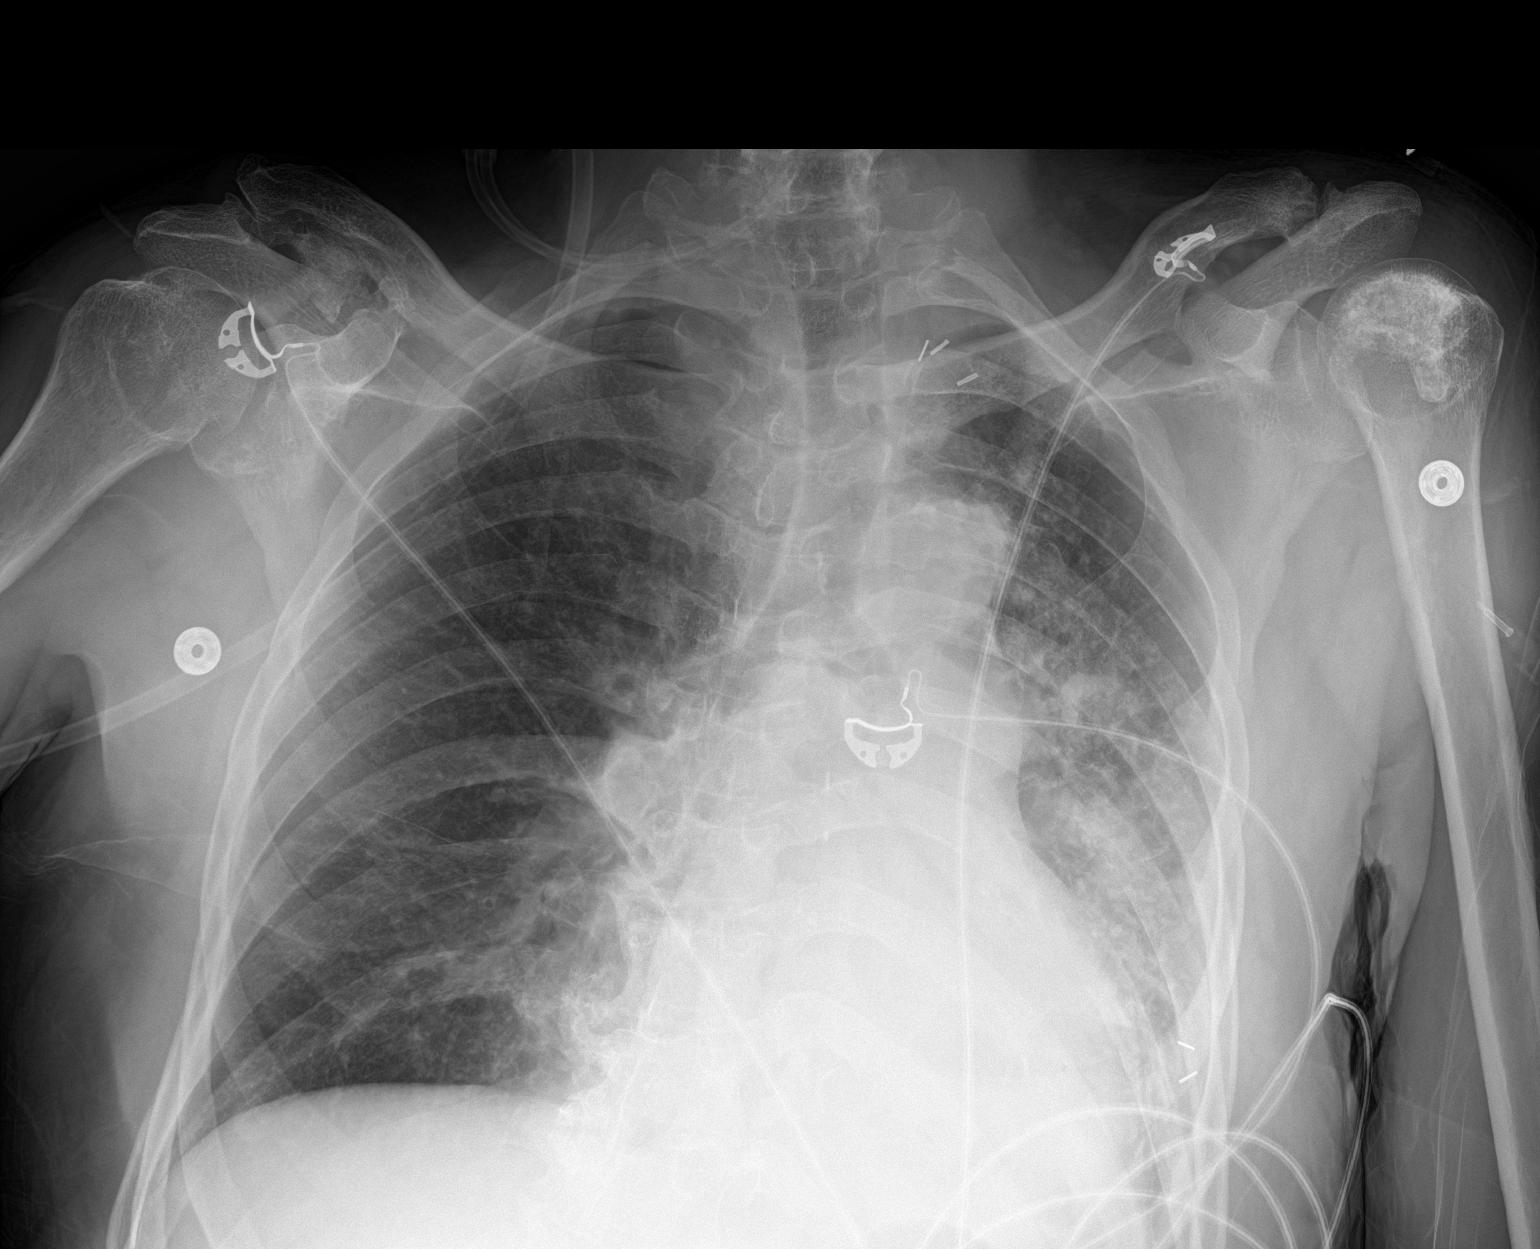

[1 of 1 positions shown; findings below may reference images not displayed]

FINDINGS: Increasing volume loss in the left lung with progressive patchy
opacities in the perihilar and lower lung zone. Leftward mediastinal
shift likely secondary to volume loss. The heart is normal in size.
No focal abnormality in the right lung. Difficult to exclude left
pleural effusion. Sclerotic density in the left humeral head is
unchanged.
IMPRESSION: Increasing volume loss in the left hemithorax with progressive left
lung opacities. Suspect aspiration/mucous plugging versus increasing
pneumonia.

## 2017-12-01 IMAGING — DX DG CHEST 1V PORT
1 series · 1 of 1 positions shown · non-contrast
Comparison: 10/19/2016

CLINICAL DATA: Pneumonia.

EXAM:
PORTABLE CHEST 1 VIEW

[chest ap]
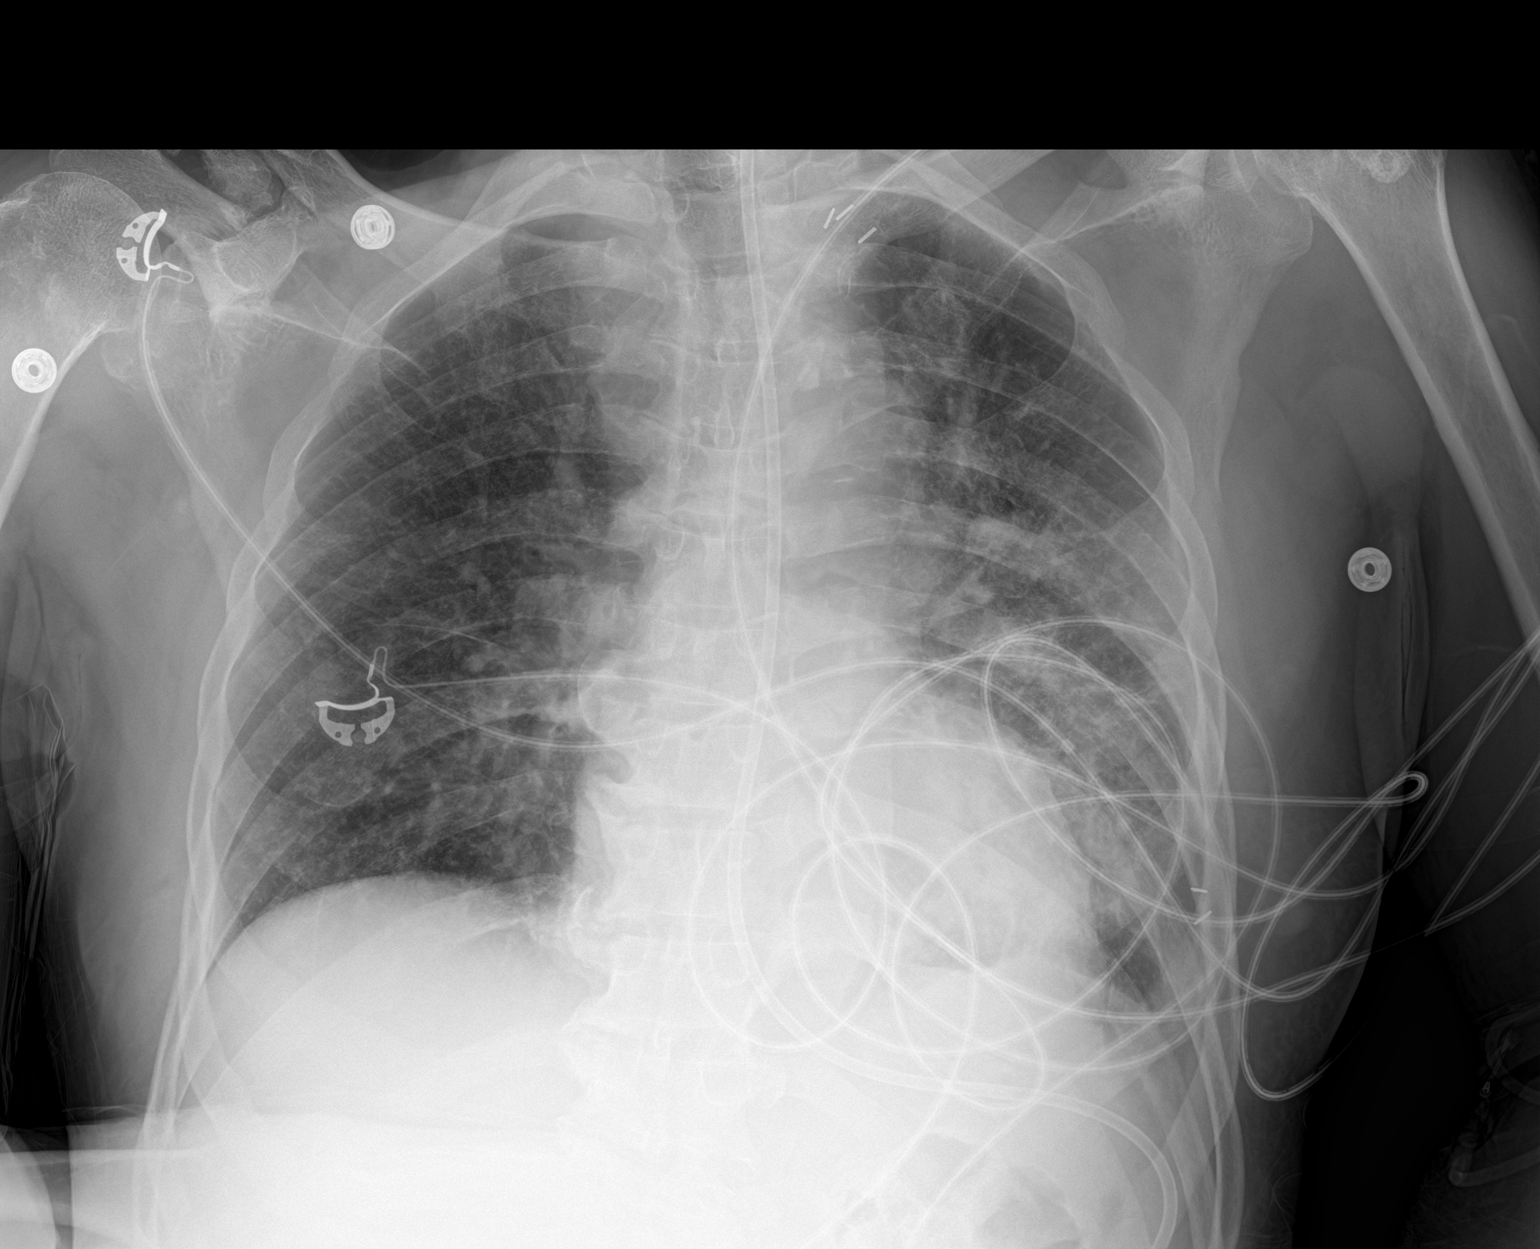

[1 of 1 positions shown; findings below may reference images not displayed]

FINDINGS: 3290 hours. A feeding tube passes into the stomach although the
distal tip position is not included on the film. Cardiopericardial
silhouette is at upper limits of normal for size. The retrocardiac
left base collapse/ consolidation has decreased in the interval.
Right lung remains clear. The visualized bony structures of the
thorax are intact. Telemetry leads overlie the chest.
IMPRESSION: Interval improvement in aeration at the left base. Otherwise stable
exam.

## 2017-12-05 IMAGING — DX DG CHEST 1V PORT
1 series · 1 of 1 positions shown · non-contrast
Comparison: 10/22/2016

CLINICAL DATA: Fever.

EXAM:
PORTABLE CHEST 1 VIEW

[chest ap]
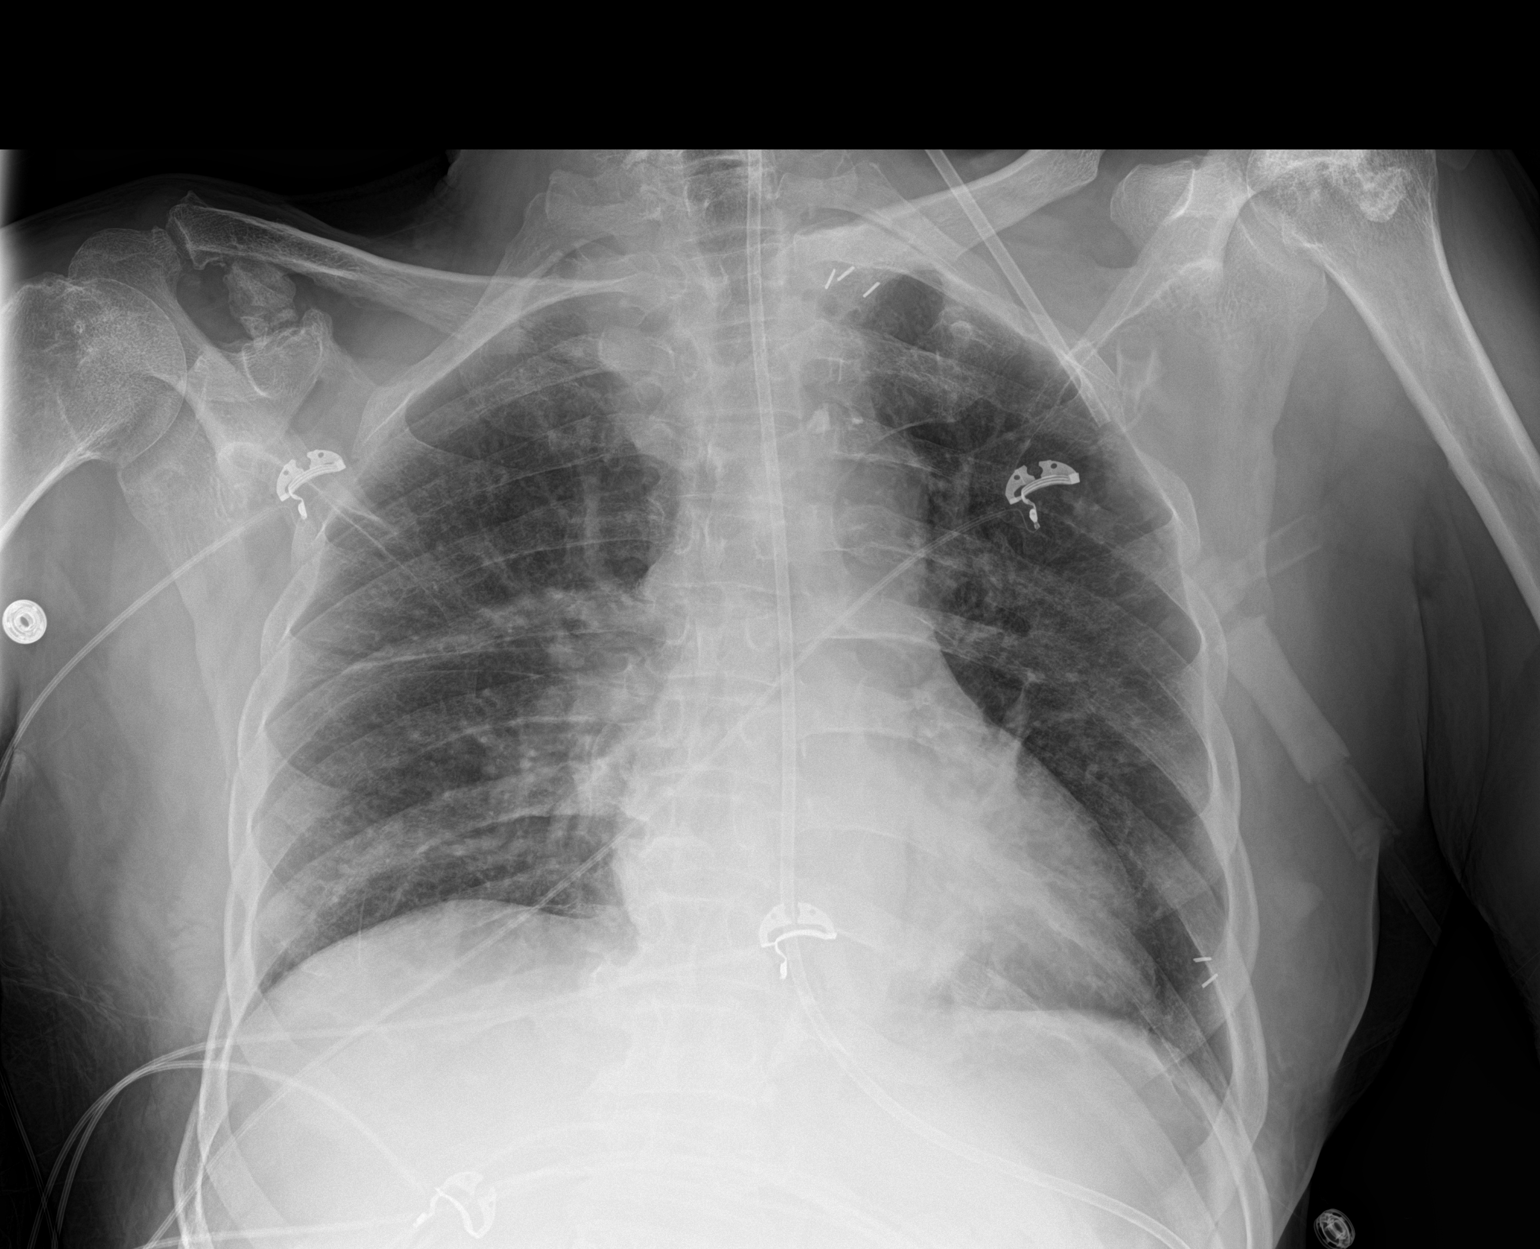

[1 of 1 positions shown; findings below may reference images not displayed]

FINDINGS: Feeding tube at least reaches the stomach. Streaky perihilar
opacities, greater on the left, possibly from underlying aspiration
given the presenting complaint. Aeration continues to improve. No
Kerley lines, effusion, or pneumothorax. Surgical clips overlapping
the left apex. Bone infarct in the proximal left humerus.
IMPRESSION: 1. Continued improvement in aeration.
2. Left greater than right perihilar atelectasis or pneumonia,
possibly from underlying aspiration given stroke presentation.

## 2017-12-05 IMAGING — CT CT ABDOMEN W/O CM
2 of 4 series · 15 of 46 positions shown, 17 images · non-contrast
Comparison: Abdominal ultrasound dated 10/16/2016

CLINICAL DATA: 66-year-old male with history of stroke and
dysphagia.

EXAM:
CT ABDOMEN WITHOUT CONTRAST
TECHNIQUE: Multidetector CT imaging of the abdomen was performed following the
standard protocol without IV contrast.

[Series 8: abdomen without · axial · non-contrast · 0.69mm/px · z∈[+1163,+1393]mm · 12 of 52 slices shown, 14 images]
[im 3/52  soft-tissue]
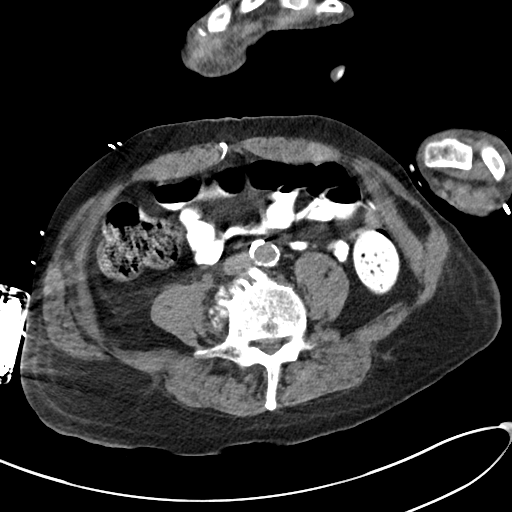
[im 3/52  bone]
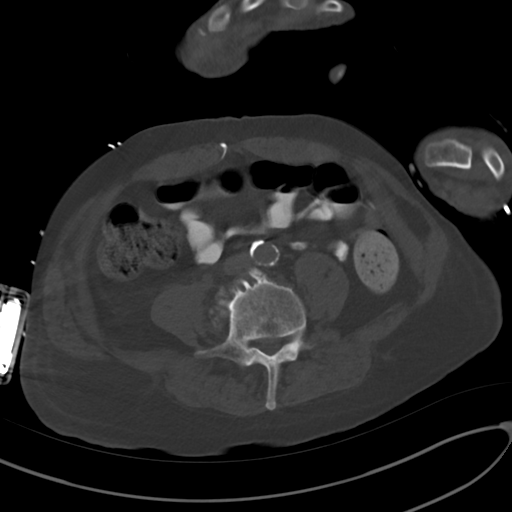
[im 9/52  soft-tissue]
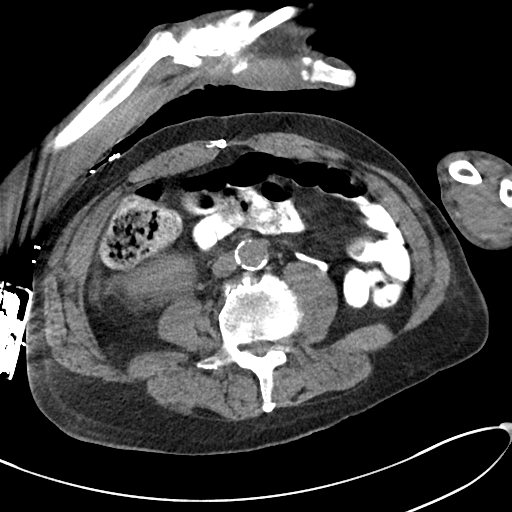
[im 12/52  soft-tissue]
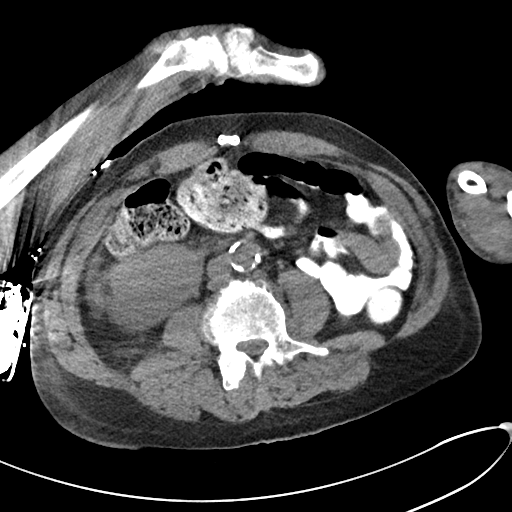
[im 15/52  soft-tissue]
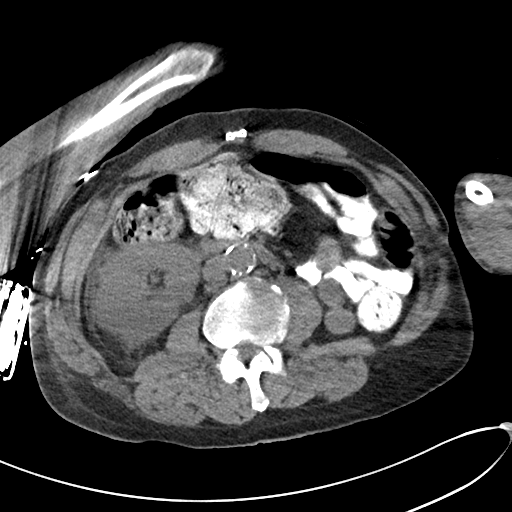
[im 20/52  soft-tissue]
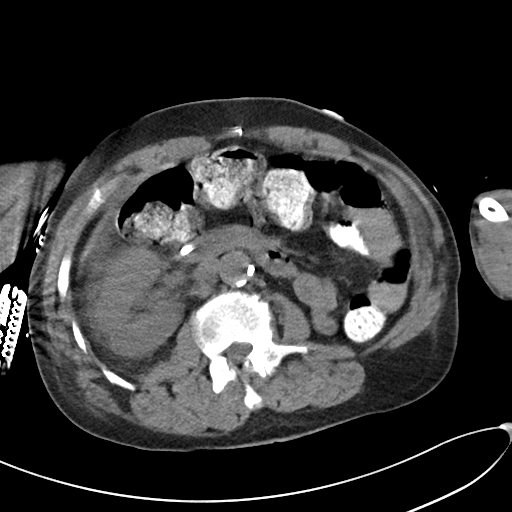
[im 23/52  soft-tissue]
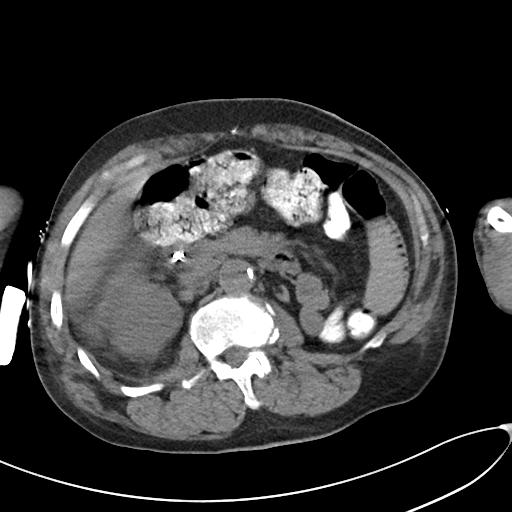
[im 29/52  soft-tissue]
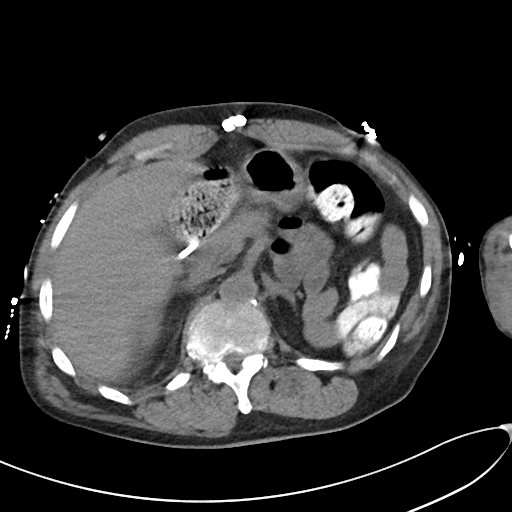
[im 32/52  soft-tissue]
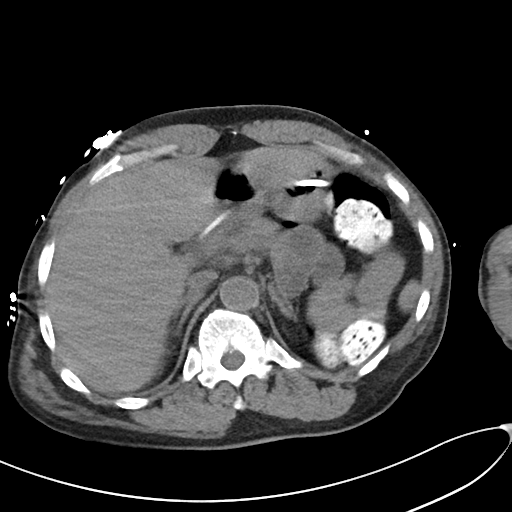
[im 37/52  soft-tissue]
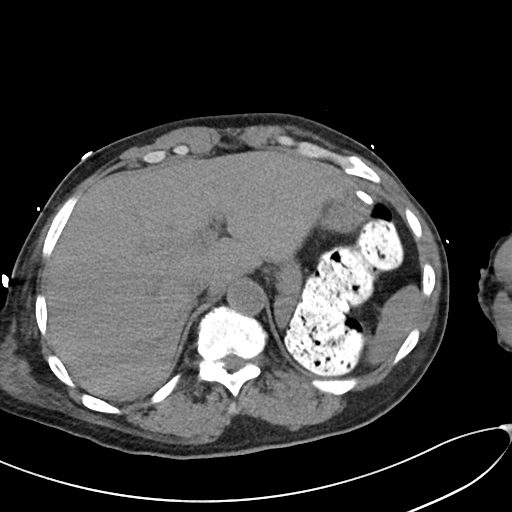
[im 37/52  bone]
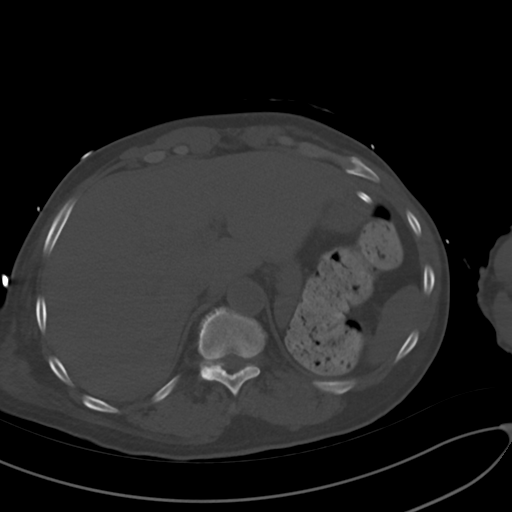
[im 40/52  soft-tissue]
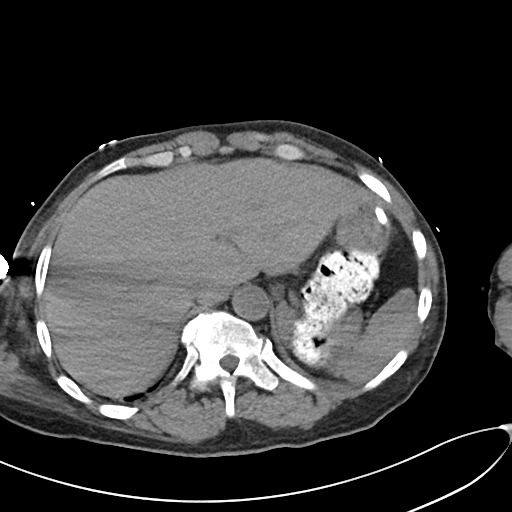
[im 43/52  soft-tissue]
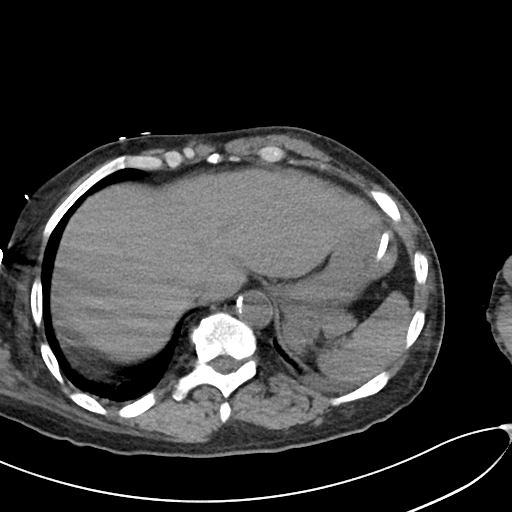
[im 49/52  soft-tissue]
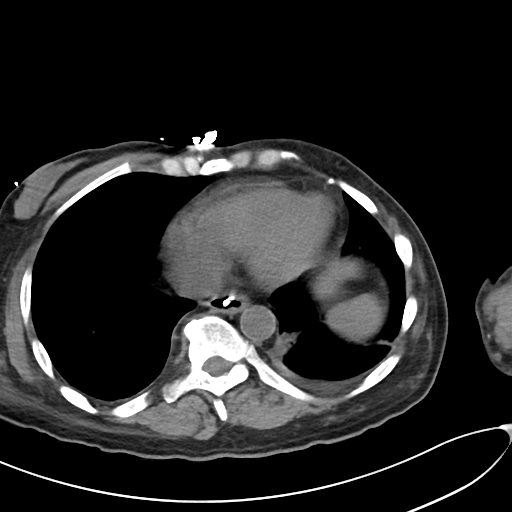

[Series 9: cor abdomen · coronal · 0.56mm/px · 3 of 80 slices shown]
[im 27/80  soft-tissue]
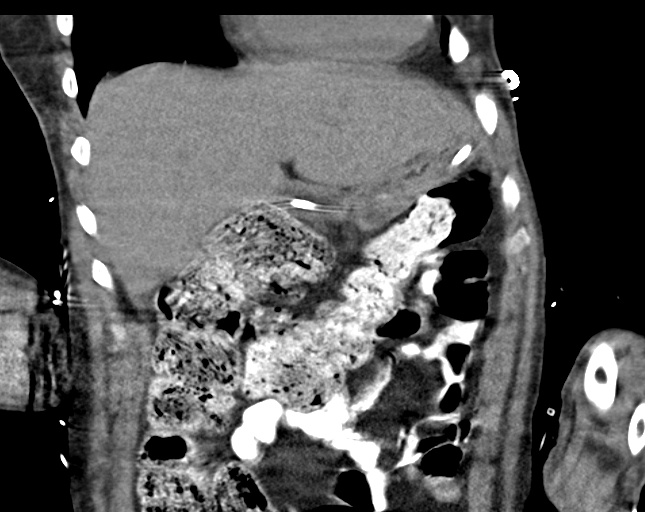
[im 36/80  soft-tissue]
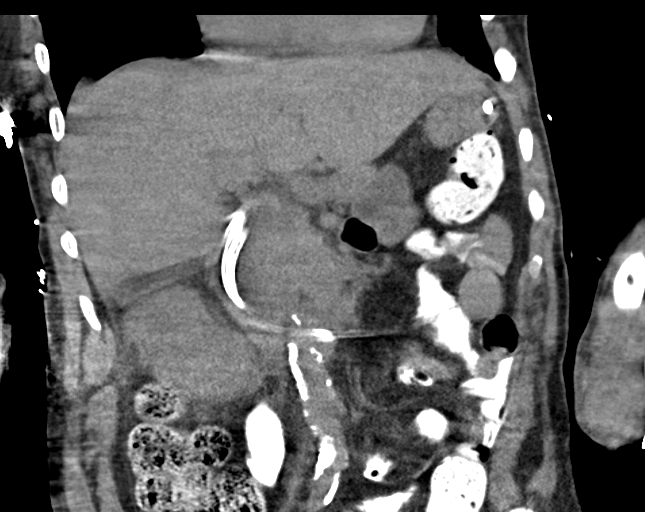
[im 44/80  soft-tissue]
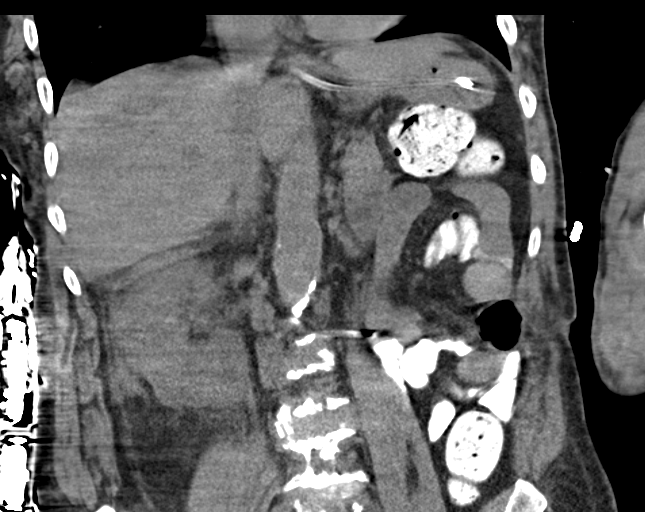

[15 of 46 positions shown; findings below may reference images not displayed]

FINDINGS: Evaluation of this exam is limited in the absence of intravenous
contrast. Evaluation is also limited due to streak artifact caused
by patient's arms.

Lower chest: Partially visualized small left pleural effusion. There
is a focal area of consolidative change at the left lung base which
may represent atelectasis/ scarring although pneumonia is not
excluded. Clinical correlation is recommended.

No intra-abdominal free air.  Small subhepatic free fluid.

Hepatobiliary: Small left hepatic calcified focus most consistent
with a granuloma. The liver is otherwise unremarkable as visualized.
The gallbladder is not visualized and may be contracted or
surgically absent.

Pancreas: The pancreas is grossly unremarkable.

Spleen: Normal in size without focal abnormality.

Adrenals/Urinary Tract: The adrenal glands are unremarkable. The
left kidney is atrophic. There is no hydronephrosis or
nephrolithiasis on the right. There is mild right perinephric
stranding, nonspecific. Correlation with urinalysis recommended to
exclude UTI.

Stomach/Bowel: An enteric tube is noted coursing through the stomach
with tip in the distal third portion of the duodenum. Oral contrast
noted within the small bowel and colon. No bowel dilatation or
active inflammation.

Vascular/Lymphatic: There is advanced atherosclerotic calcification
of the abdominal aorta. The aorta and IVC are otherwise grossly
unremarkable on this noncontrast study. No portal venous gas. There
is no adenopathy.

Other: Midline anterior abdominal wall surgical clips noted. No
fluid collection.

Musculoskeletal: There is multilevel degenerative changes of the
spine. No acute fracture.
IMPRESSION: 1. Small left pleural effusion and focal left lung base
consolidative change, likely atelectasis or scarring. Pneumonia is
not excluded. Clinical correlation is recommended.
2. Mild right perinephric stranding, nonspecific. Correlation with
urinalysis recommended to exclude UTI. Atrophic left kidney.
3. No evidence of bowel obstruction. Enteric tube with tip in the
distal duodenum.
4.  Aortic Atherosclerosis ([id number]).

## 2017-12-06 IMAGING — XA IR PERC PLACEMENT GASTROSTOMY
3 series · 5 of 5 positions shown · non-contrast
Comparison: none

INDICATION: Stroke

[Series 1: fl (-) angio · 1 of 1 slices shown (1 of 3)]
[im 1/1]
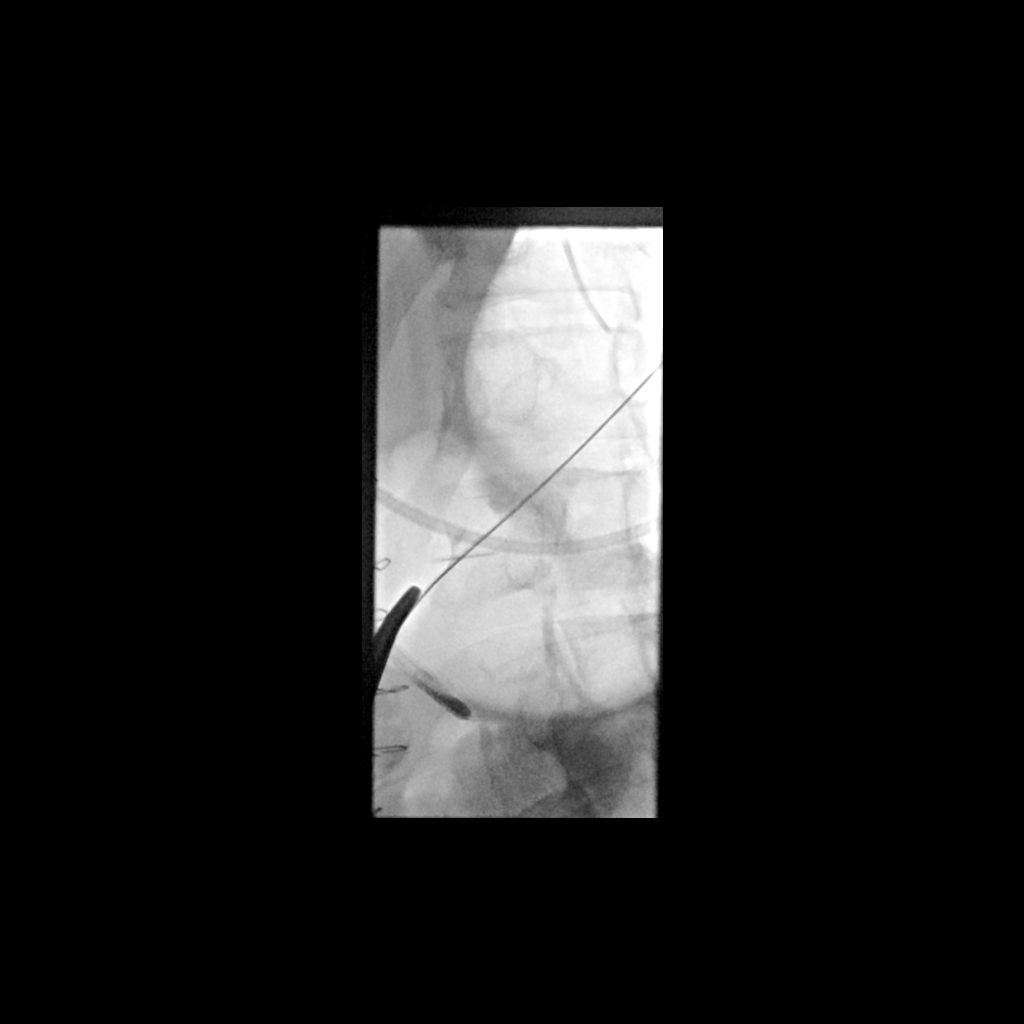

[Series 2: fl (-) angio · 1 of 1 slices shown (2 of 3)]
[im 1/1]
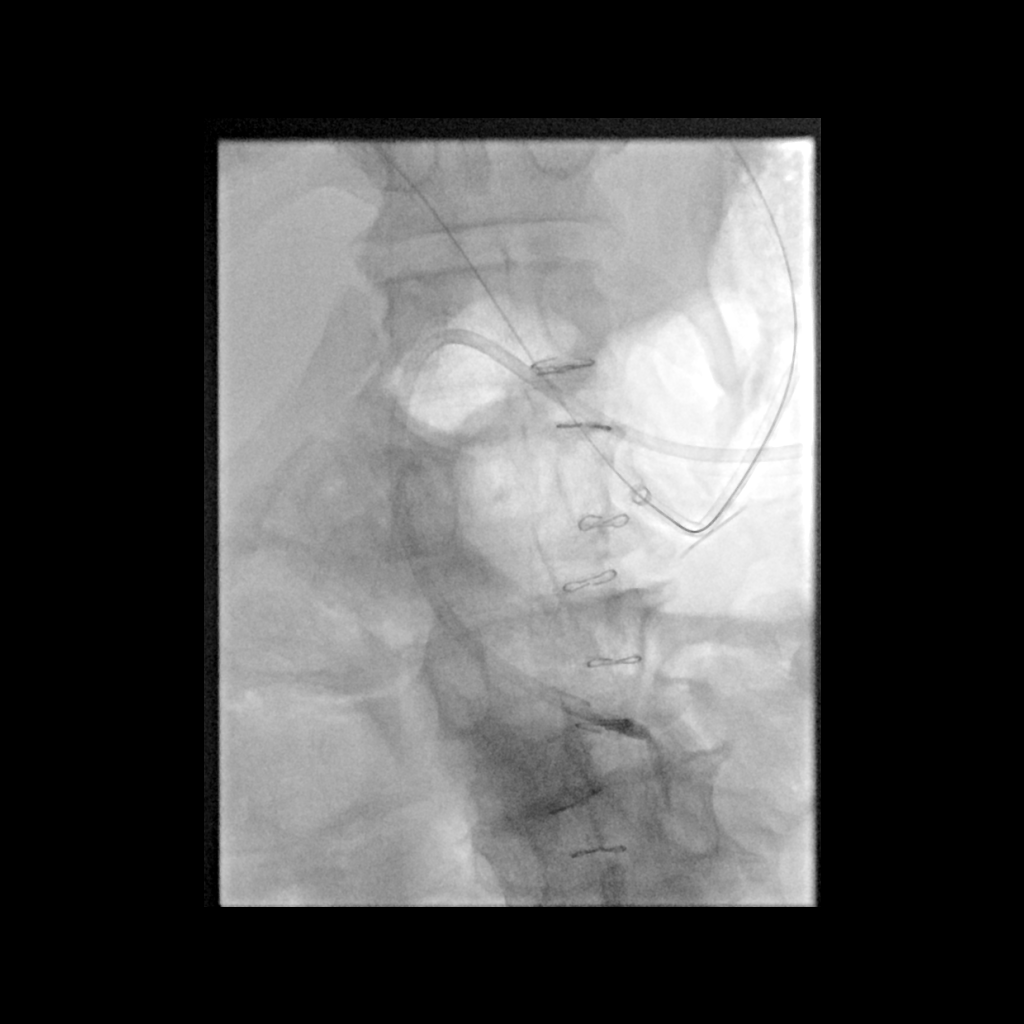

[Series 3: fl (-) angio · 3 of 3 slices shown (3 of 3)]
[im 1/3]
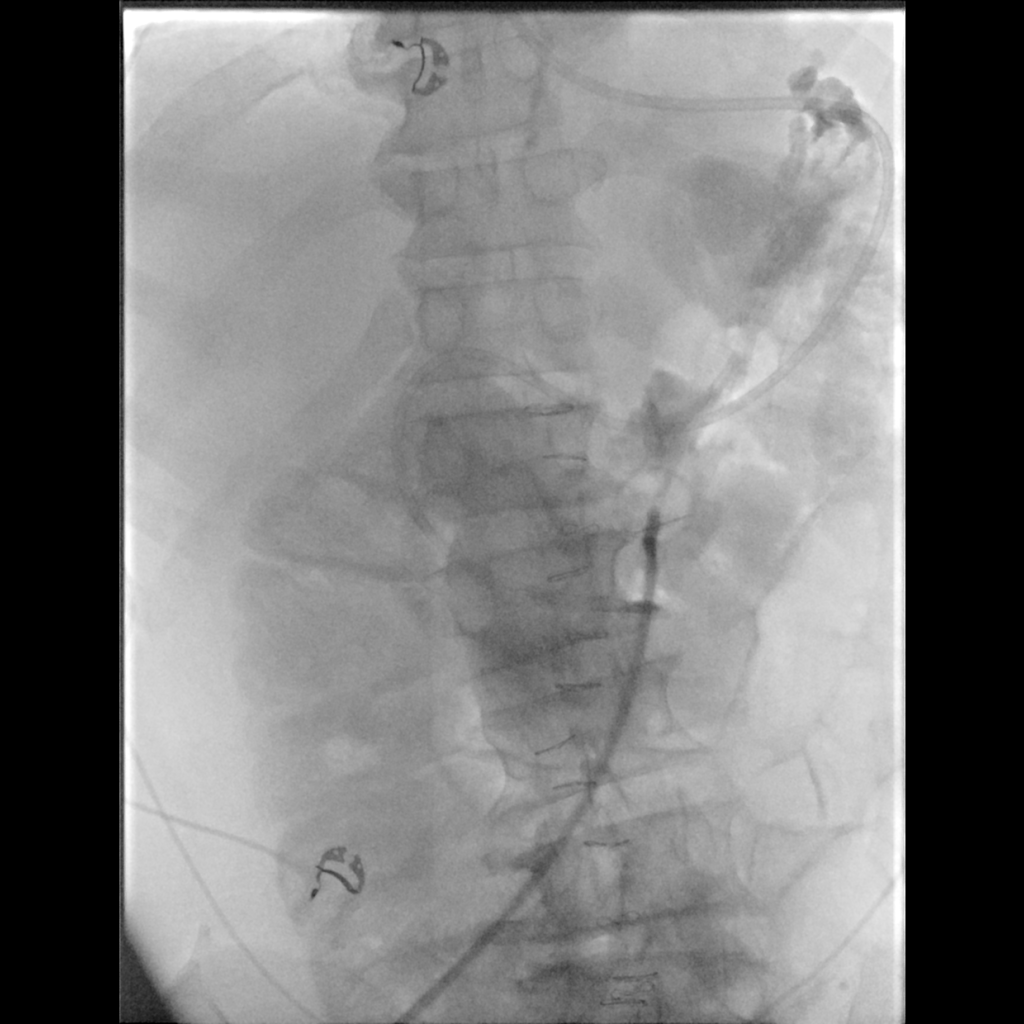
[im 2/3]
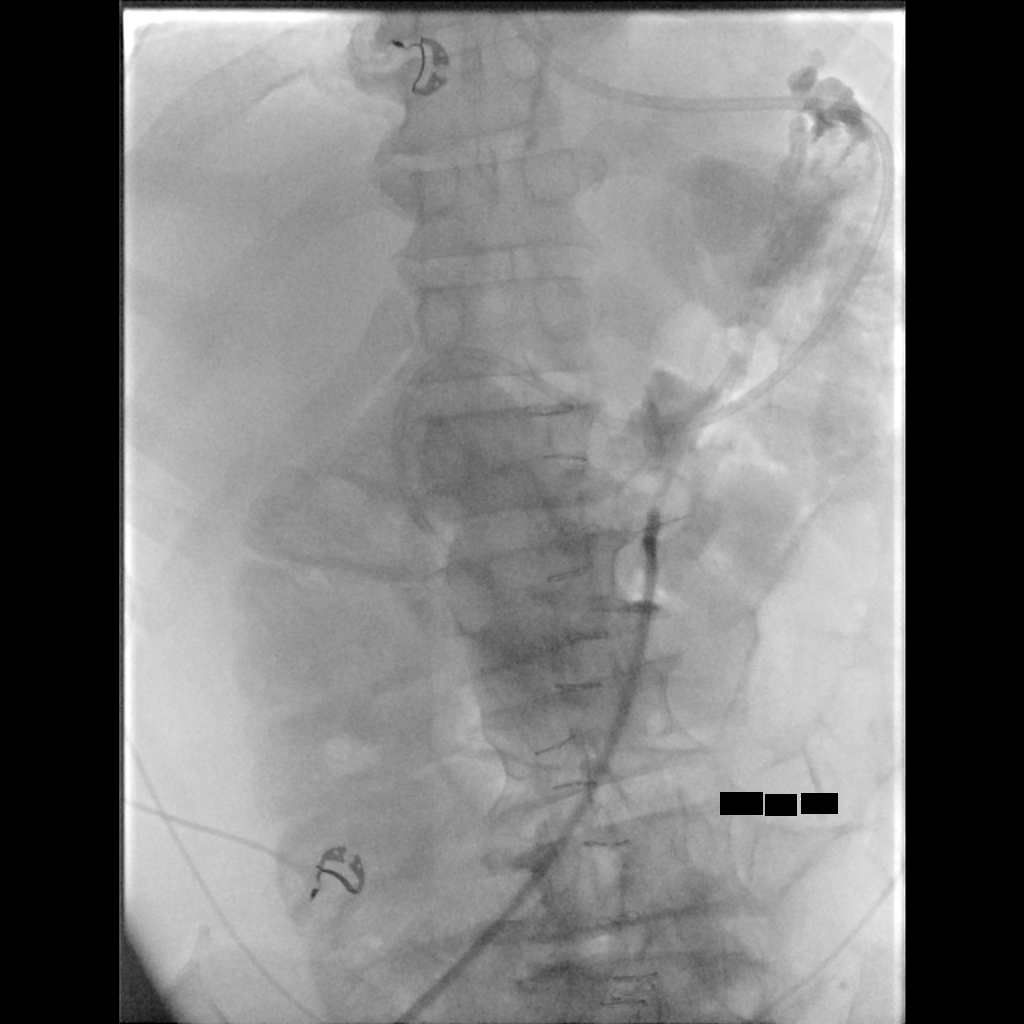
[im 3/3]
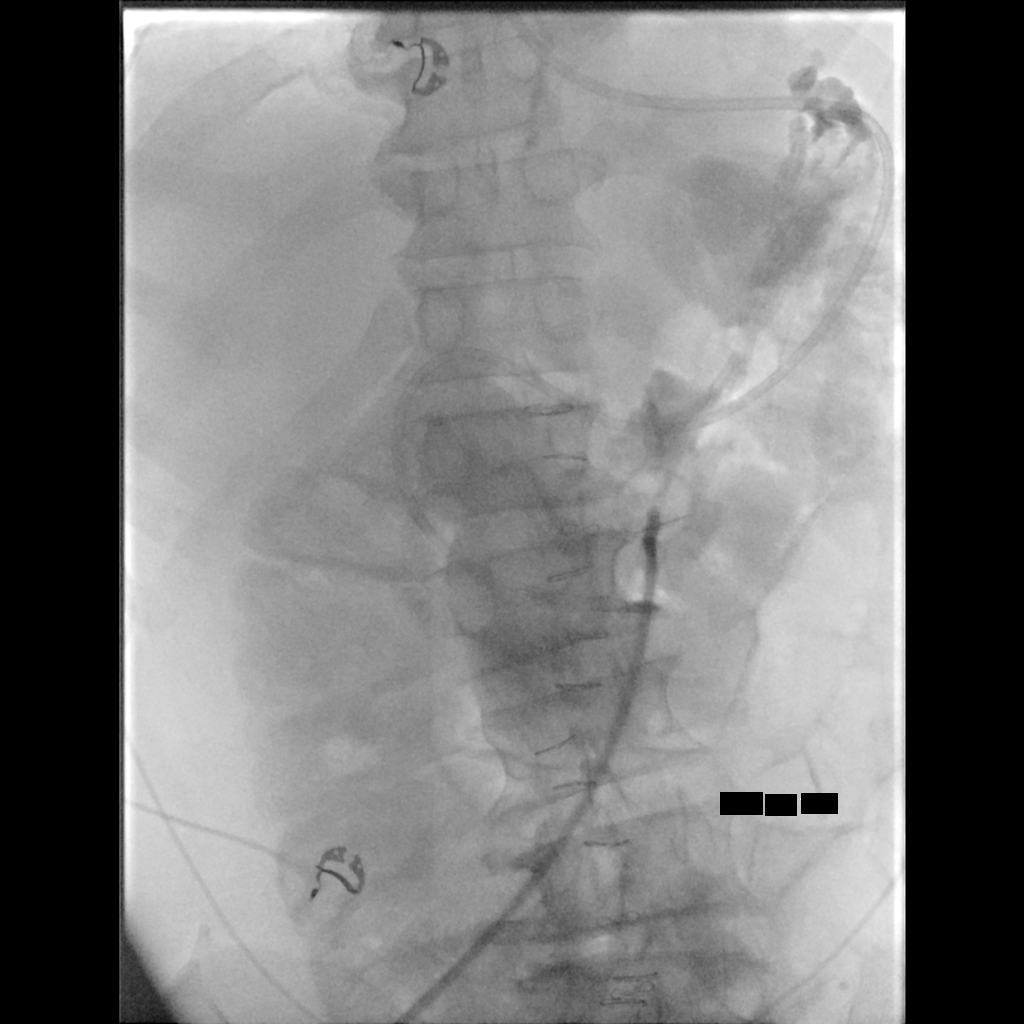

[5 of 5 positions shown; findings below may reference images not displayed]

EXAM:
PERC PLACEMENT GASTROSTOMY

MEDICATIONS:
Ancef 2 g; Antibiotics were administered within 1 hour of the
procedure. Glucagon 1 mg IV

ANESTHESIA/SEDATION:
Versed 1 mg IV; Fentanyl 50 mcg IV

Moderate Sedation Time:  17

The patient was continuously monitored during the procedure by the
interventional radiology nurse under my direct supervision.

CONTRAST:  20 cc Wsovue-GII - administered into the gastric lumen.

FLUOROSCOPY TIME:  Fluoroscopy Time: 3 minutes 42 seconds (13 mGy).

COMPLICATIONS:
None immediate.

PROCEDURE:
The procedure, risks, benefits, and alternatives were explained to
the patient. Questions regarding the procedure were encouraged and
answered. The patient understands and consents to the procedure.

The epigastrium was prepped with Betadine in a sterile fashion, and
a sterile drape was applied covering the operative field. A sterile
gown and sterile gloves were used for the procedure.

A 5-French orogastric tube is placed under fluoroscopic guidance.
Scout imaging of the abdomen confirms barium within the transverse
colon.

The stomach was distended with gas. Under fluoroscopic guidance, an
18 gauge needle was utilized to puncture the anterior wall of the
body of the stomach. An Amplatz wire was advanced through the needle
passing a T fastener into the lumen of the stomach. The T fastener
was secured for gastropexy. A 9-French sheath was inserted.

A snare was advanced through the 9-French sheath. Meliton Tiger was
advanced through the orogastric tube. It was snared then pulled out
the oral cavity, pulling the snare, as well. The leading edge of the
gastrostomy was attached to the snare. It was then pulled down the
esophagus and out the percutaneous site. It was secured in place.
Contrast was injected.

The image demonstrates placement of a 20-French pull-through type
gastrostomy tube into the body of the stomach.
IMPRESSION: Successful 20 French pull-through gastrostomy.

## 2017-12-07 IMAGING — CR DG CHEST 1V PORT
1 series · 1 of 1 positions shown · non-contrast
Comparison: 10/26/2016

CLINICAL DATA: Respiratory distress

EXAM:
PORTABLE CHEST 1 VIEW

[AP]
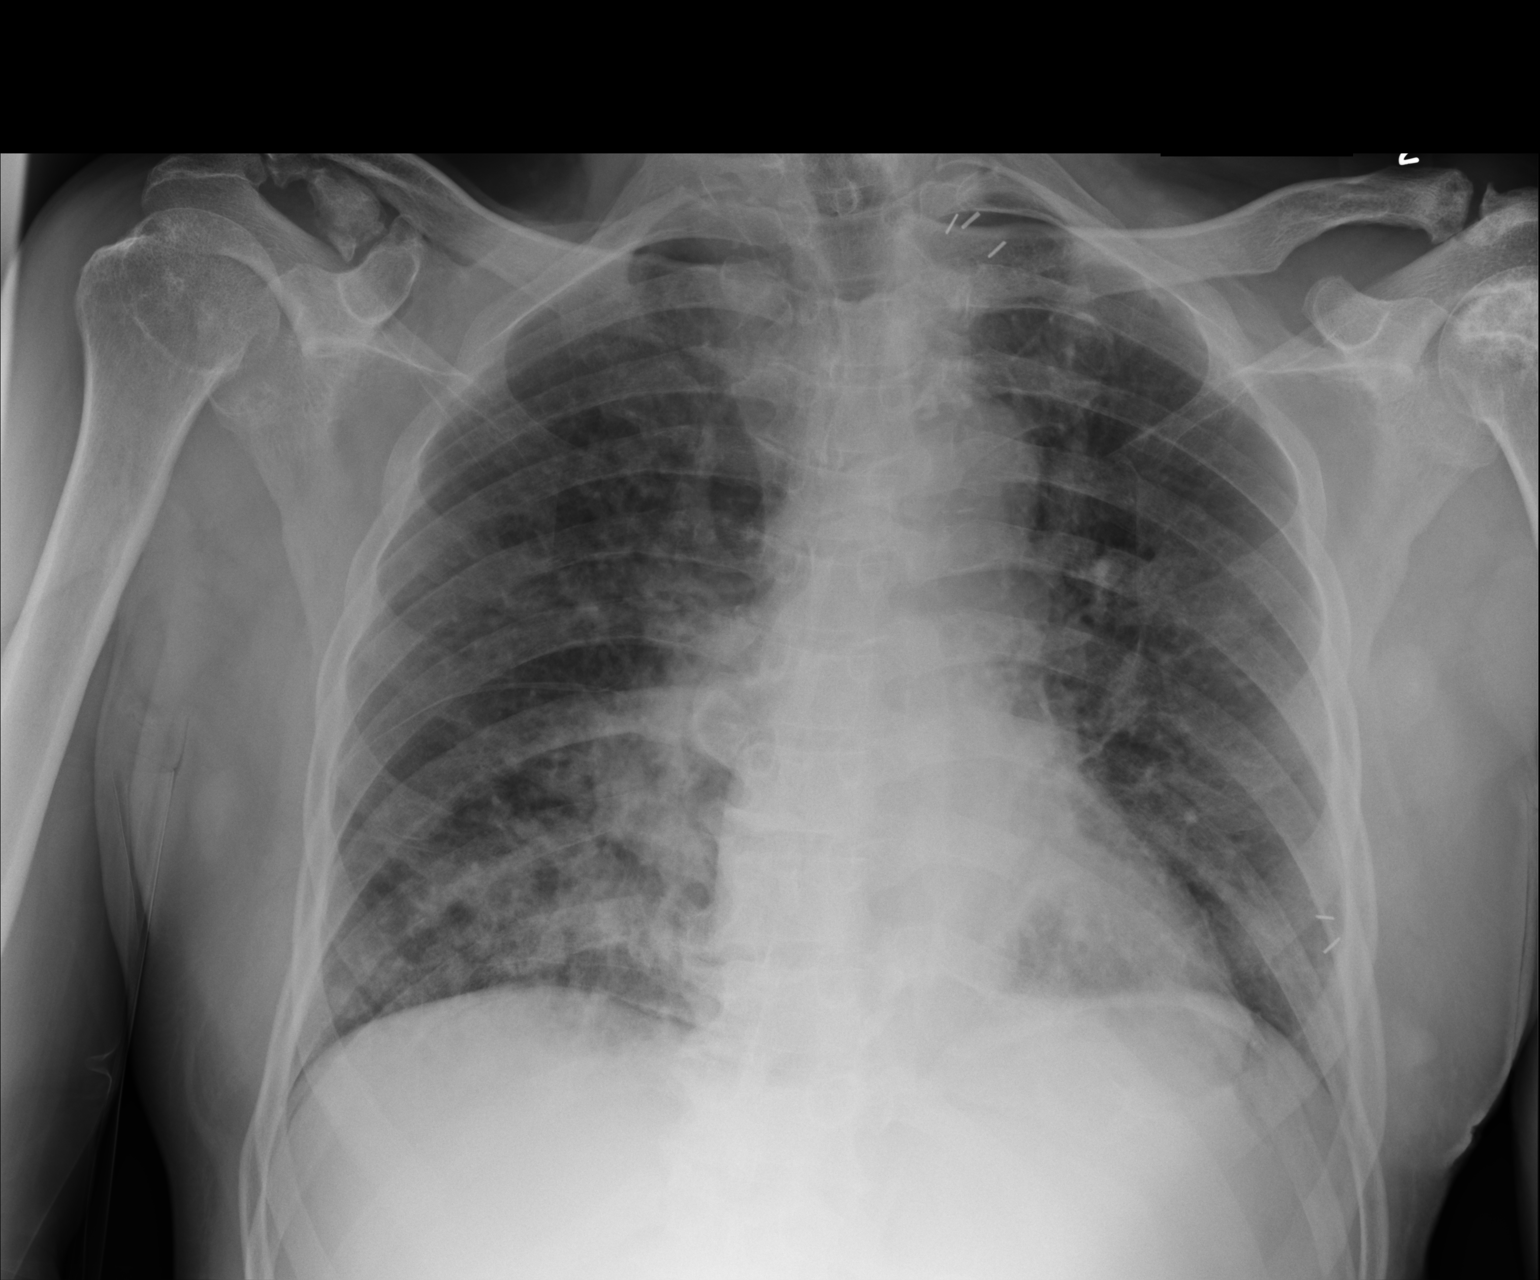

[1 of 1 positions shown; findings below may reference images not displayed]

FINDINGS: Surgical clips at the left neck. Removal of esophageal tube. Patchy
infiltrates at the right base. No pleural effusion. Stable
cardiomediastinal silhouette with central congestion.
IMPRESSION: Interval worsening of right perihilar and basilar pulmonary opacity
which may reflect aspiration given history.

Continued central vascular congestion.

## 2017-12-08 IMAGING — DX DG CHEST 1V PORT
1 series · 1 of 1 positions shown · non-contrast
Comparison: Chest radiograph October 28, 2016

CLINICAL DATA: Endotracheal tube placement.

EXAM:
PORTABLE CHEST 1 VIEW

[chest ap]
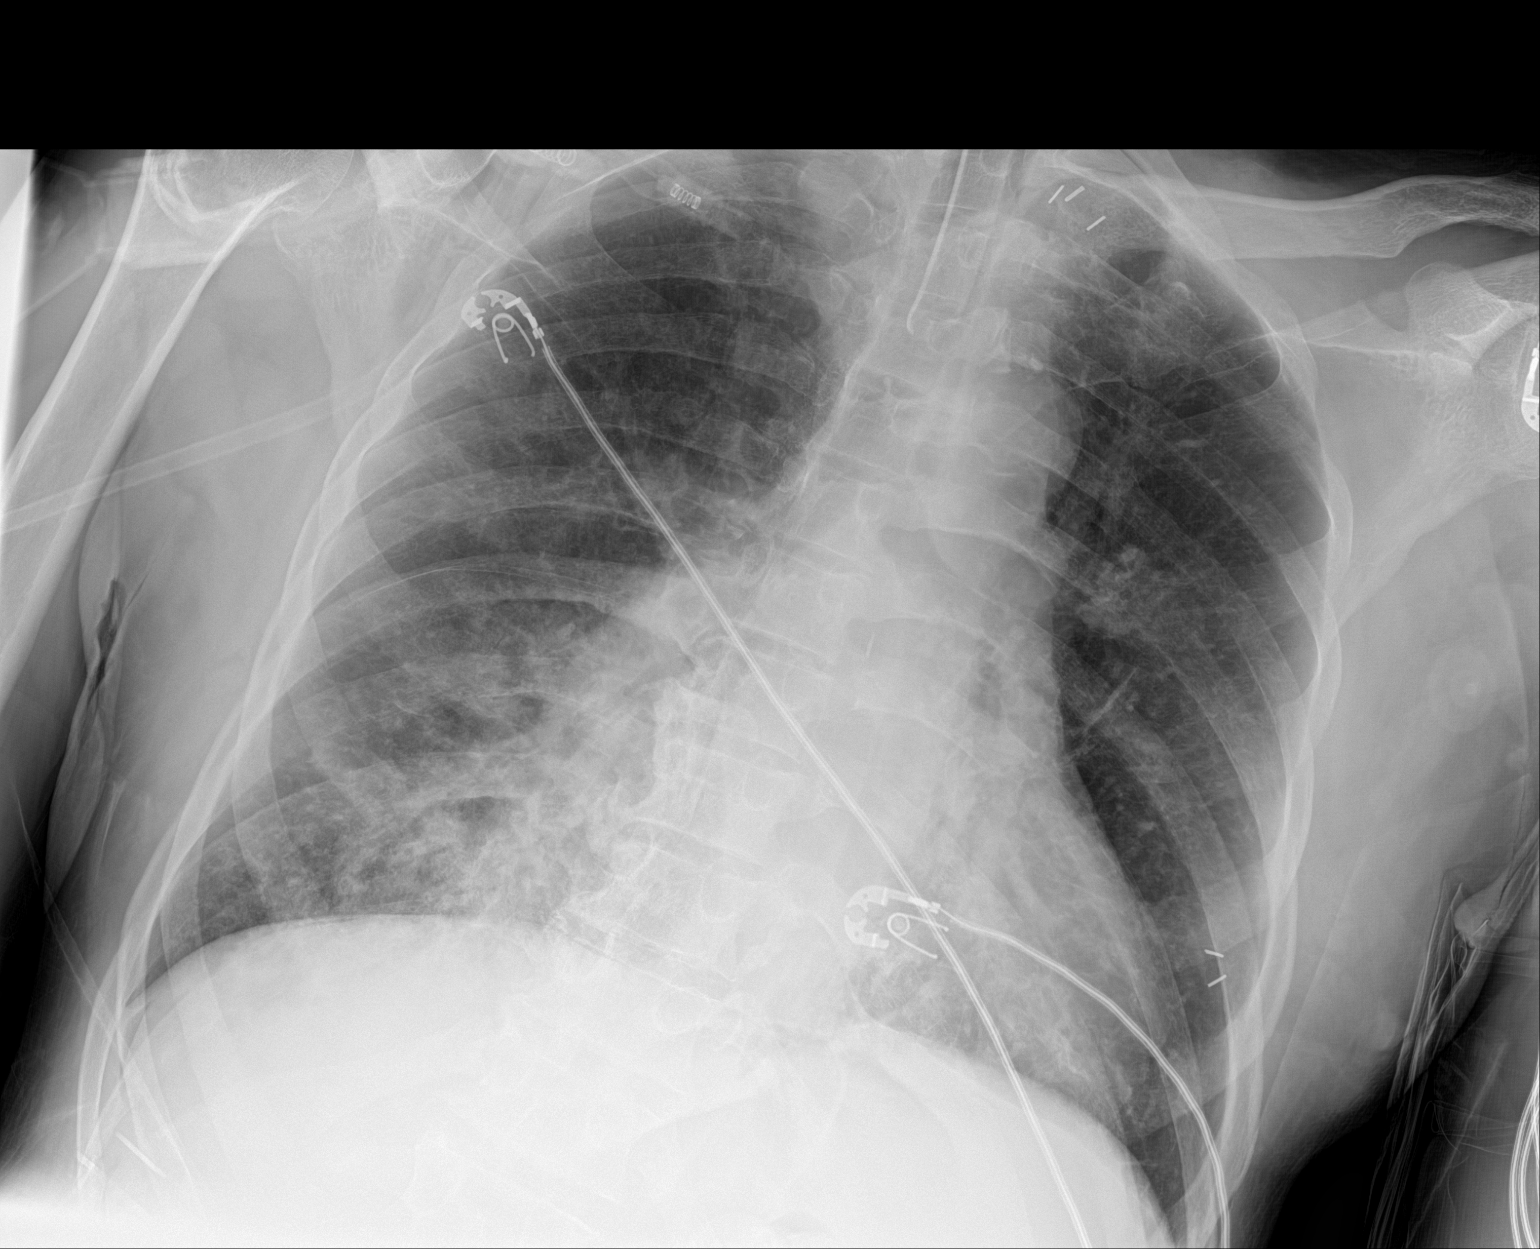

[1 of 1 positions shown; findings below may reference images not displayed]

FINDINGS: Endotracheal tube tip projects 4.4 cm above the carina.

Cardiac silhouette is upper limits normal in size, mediastinal
silhouette is nonsuspicious. RIGHT middle lobe airspace opacity with
diffuse mild interstitial prominence. No pleural effusion. No
pneumothorax. Surgical clips project in LEFT chest. Osseous
structures are unchanged.
IMPRESSION: Endotracheal tube tip projects 4.4 cm above the carina.

Worsening RIGHT middle lobe consolidation concerning for new
bronchopneumonia.

## 2017-12-08 IMAGING — CR DG ABDOMEN 1V
1 series · 1 of 1 positions shown · non-contrast
Comparison: CT 10/26/2016

CLINICAL DATA: Ileus

EXAM:
ABDOMEN - 1 VIEW

[AP]
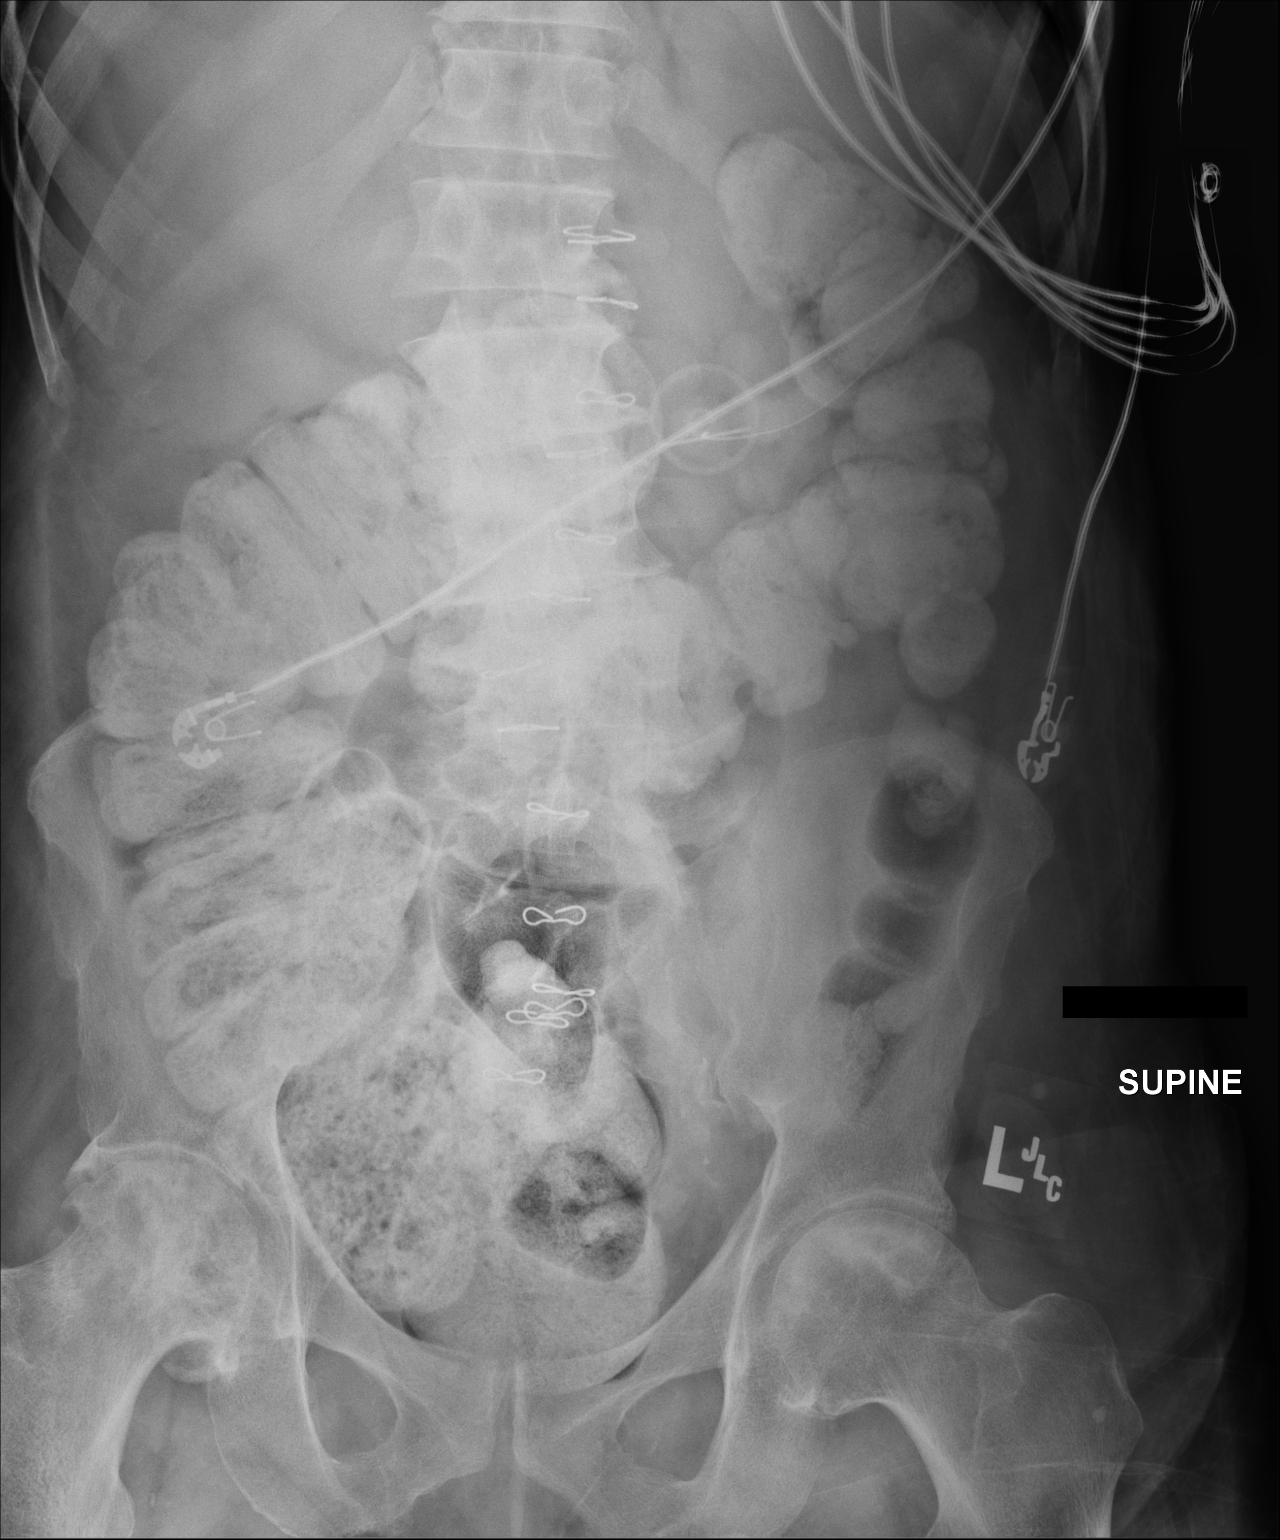

[1 of 1 positions shown; findings below may reference images not displayed]

FINDINGS: Gastrostomy tube projects over the stomach. Oral contrast material
seen throughout the colon with moderate stool burden. No evidence of
bowel obstruction, organomegaly or free air.
IMPRESSION: Contrast throughout the colon. No evidence of bowel obstruction.
Moderate stool burden.

## 2017-12-08 IMAGING — CR DG CHEST 1V PORT
2 series · 2 of 2 positions shown · non-contrast
Comparison: 01/29/2017

CLINICAL DATA: Status post central line placed

EXAM:
PORTABLE CHEST 1 VIEW

[AP (1 of 2)]
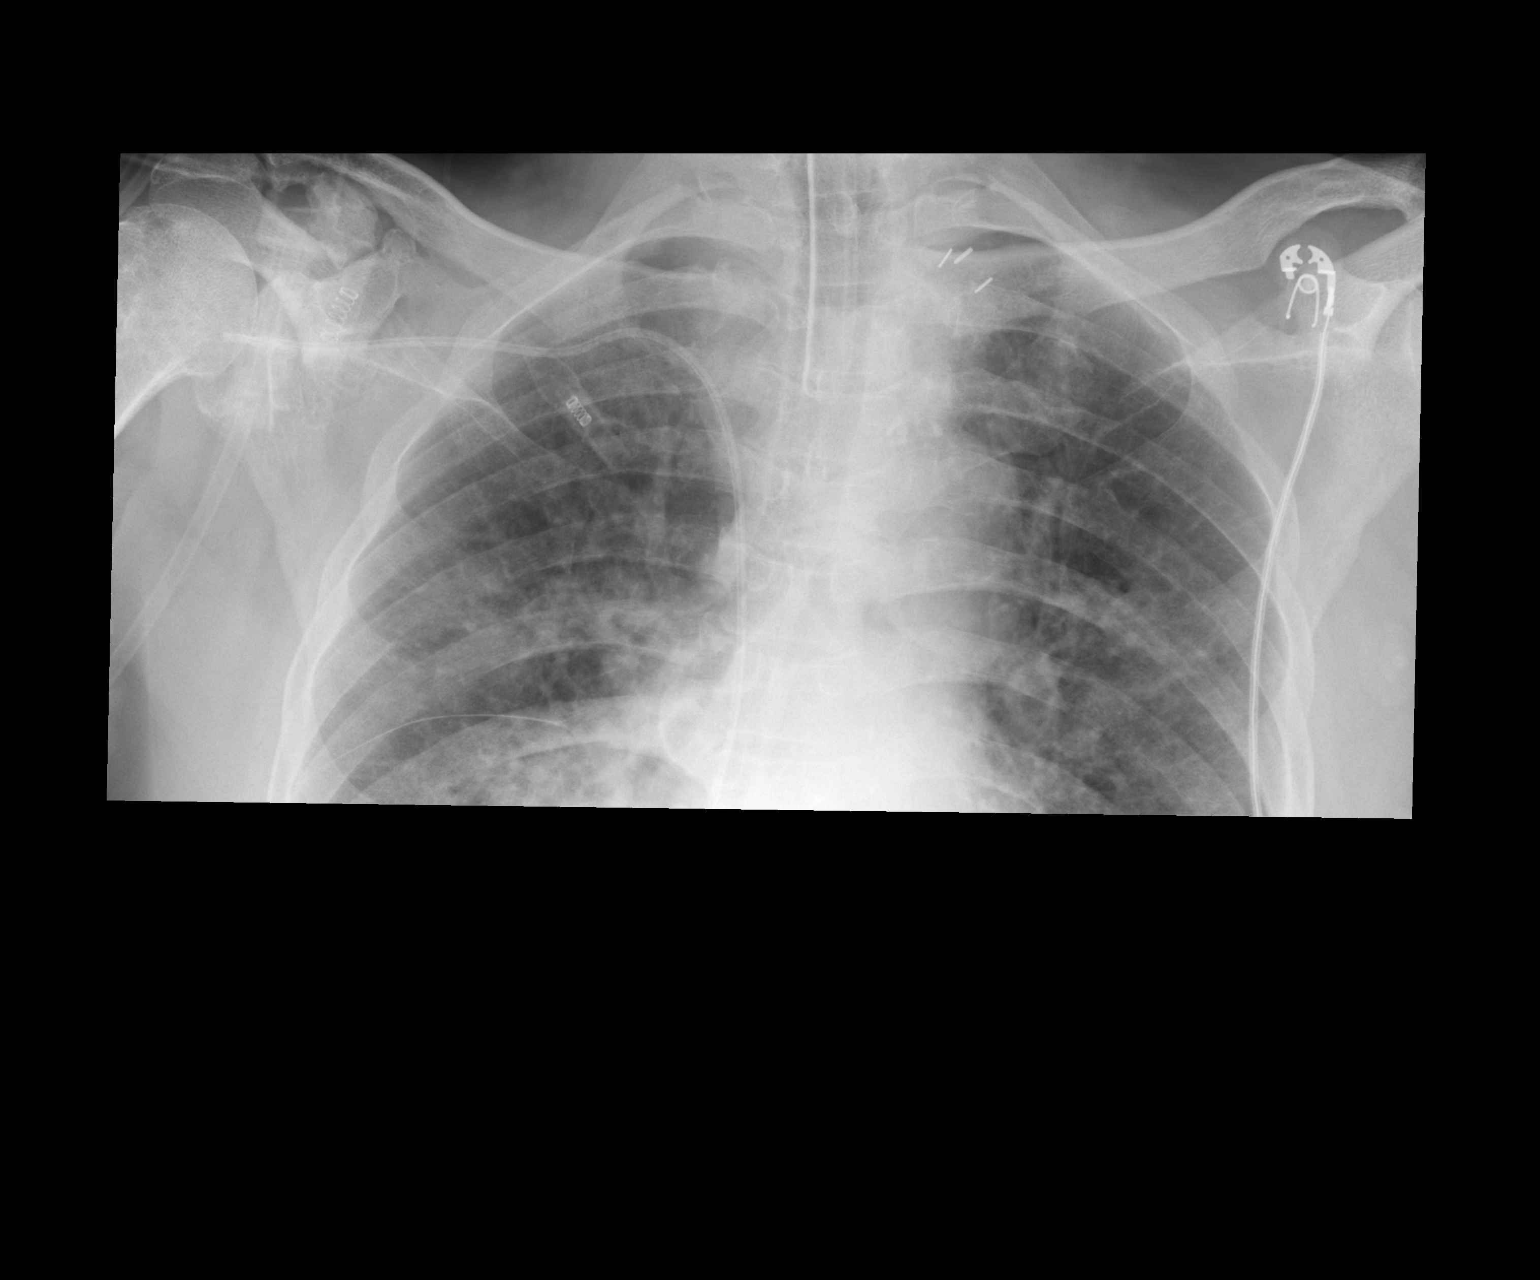

[AP (2 of 2)]
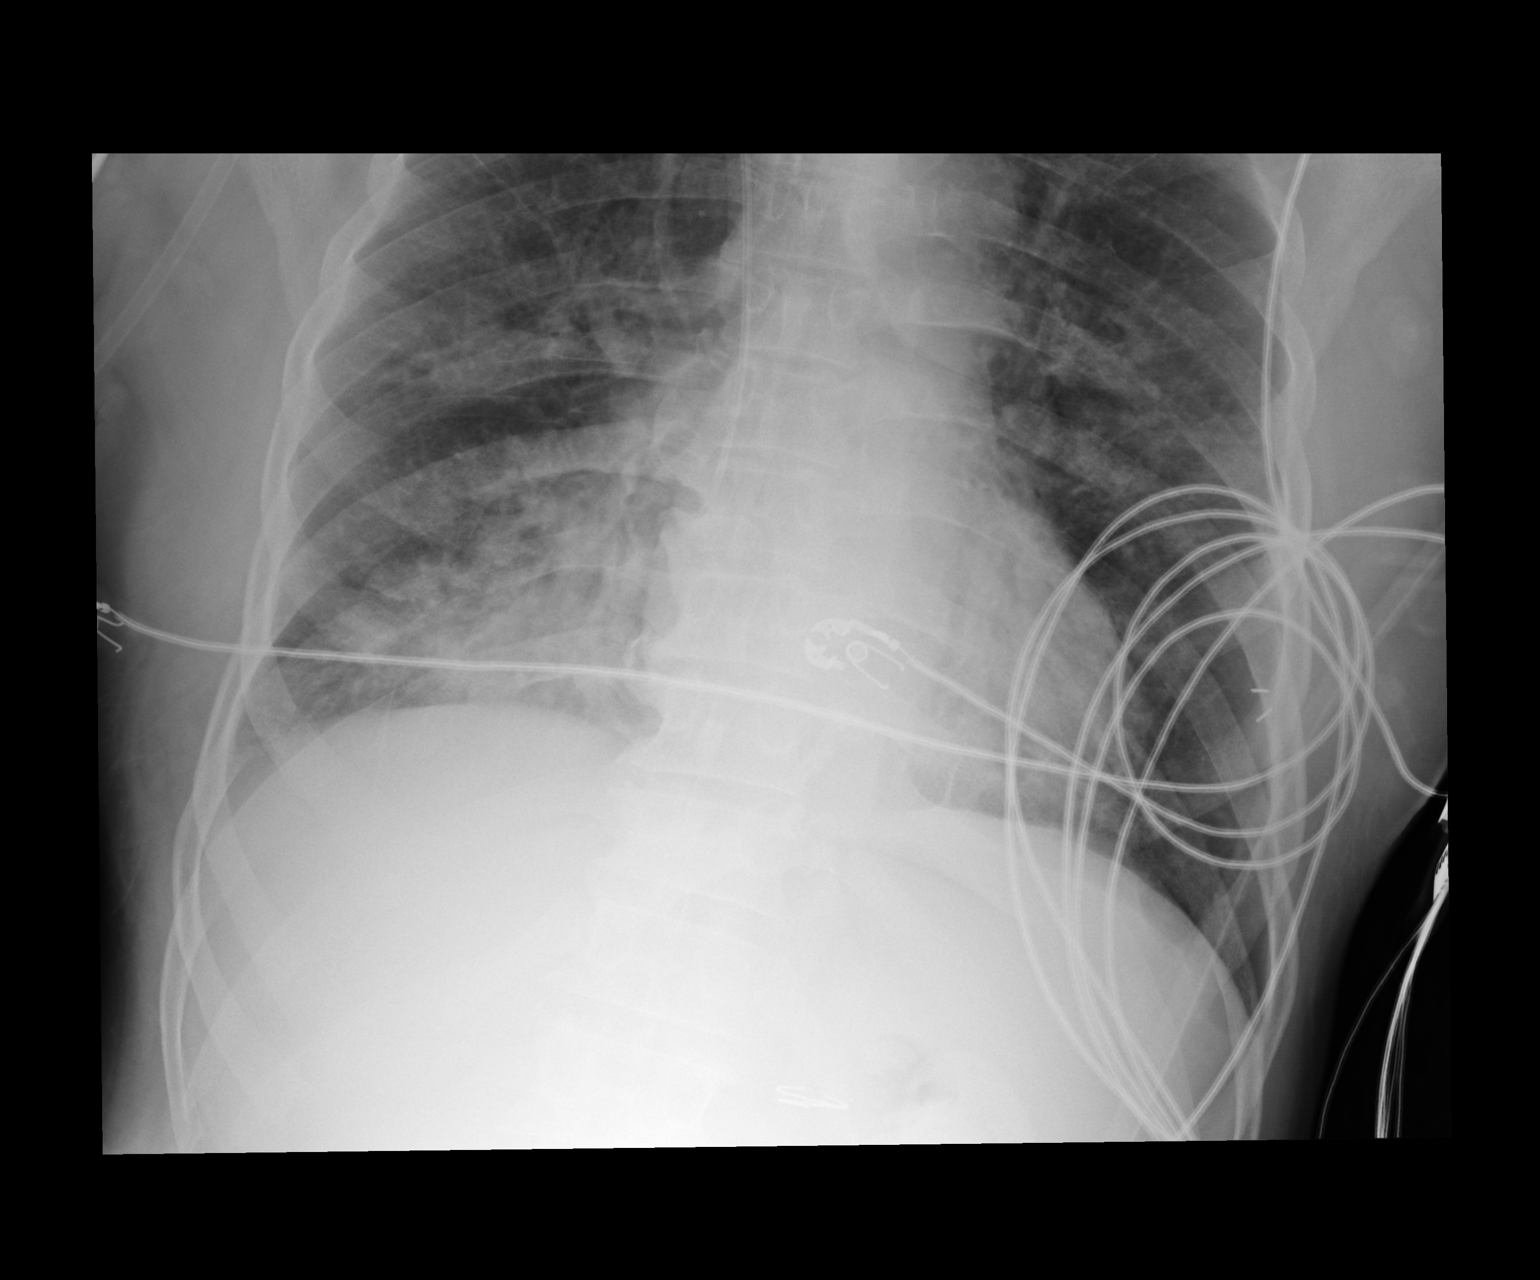

[2 of 2 positions shown; findings below may reference images not displayed]

FINDINGS: Endotracheal tube is again noted. Cardiac shadow is stable. New
right-sided central venous line is noted at cavoatrial junction. No
pneumothorax is seen. Persistent right basilar infiltrate is noted.
No bony abnormality is noted.
IMPRESSION: Stable right basilar infiltrate.

No pneumothorax following central line placement.

## 2017-12-09 IMAGING — DX DG CHEST 1V PORT
1 series · 1 of 1 positions shown · non-contrast
Comparison: 10/29/2016 .

CLINICAL DATA: Respiratory failure.

EXAM:
PORTABLE CHEST 1 VIEW

[chest]
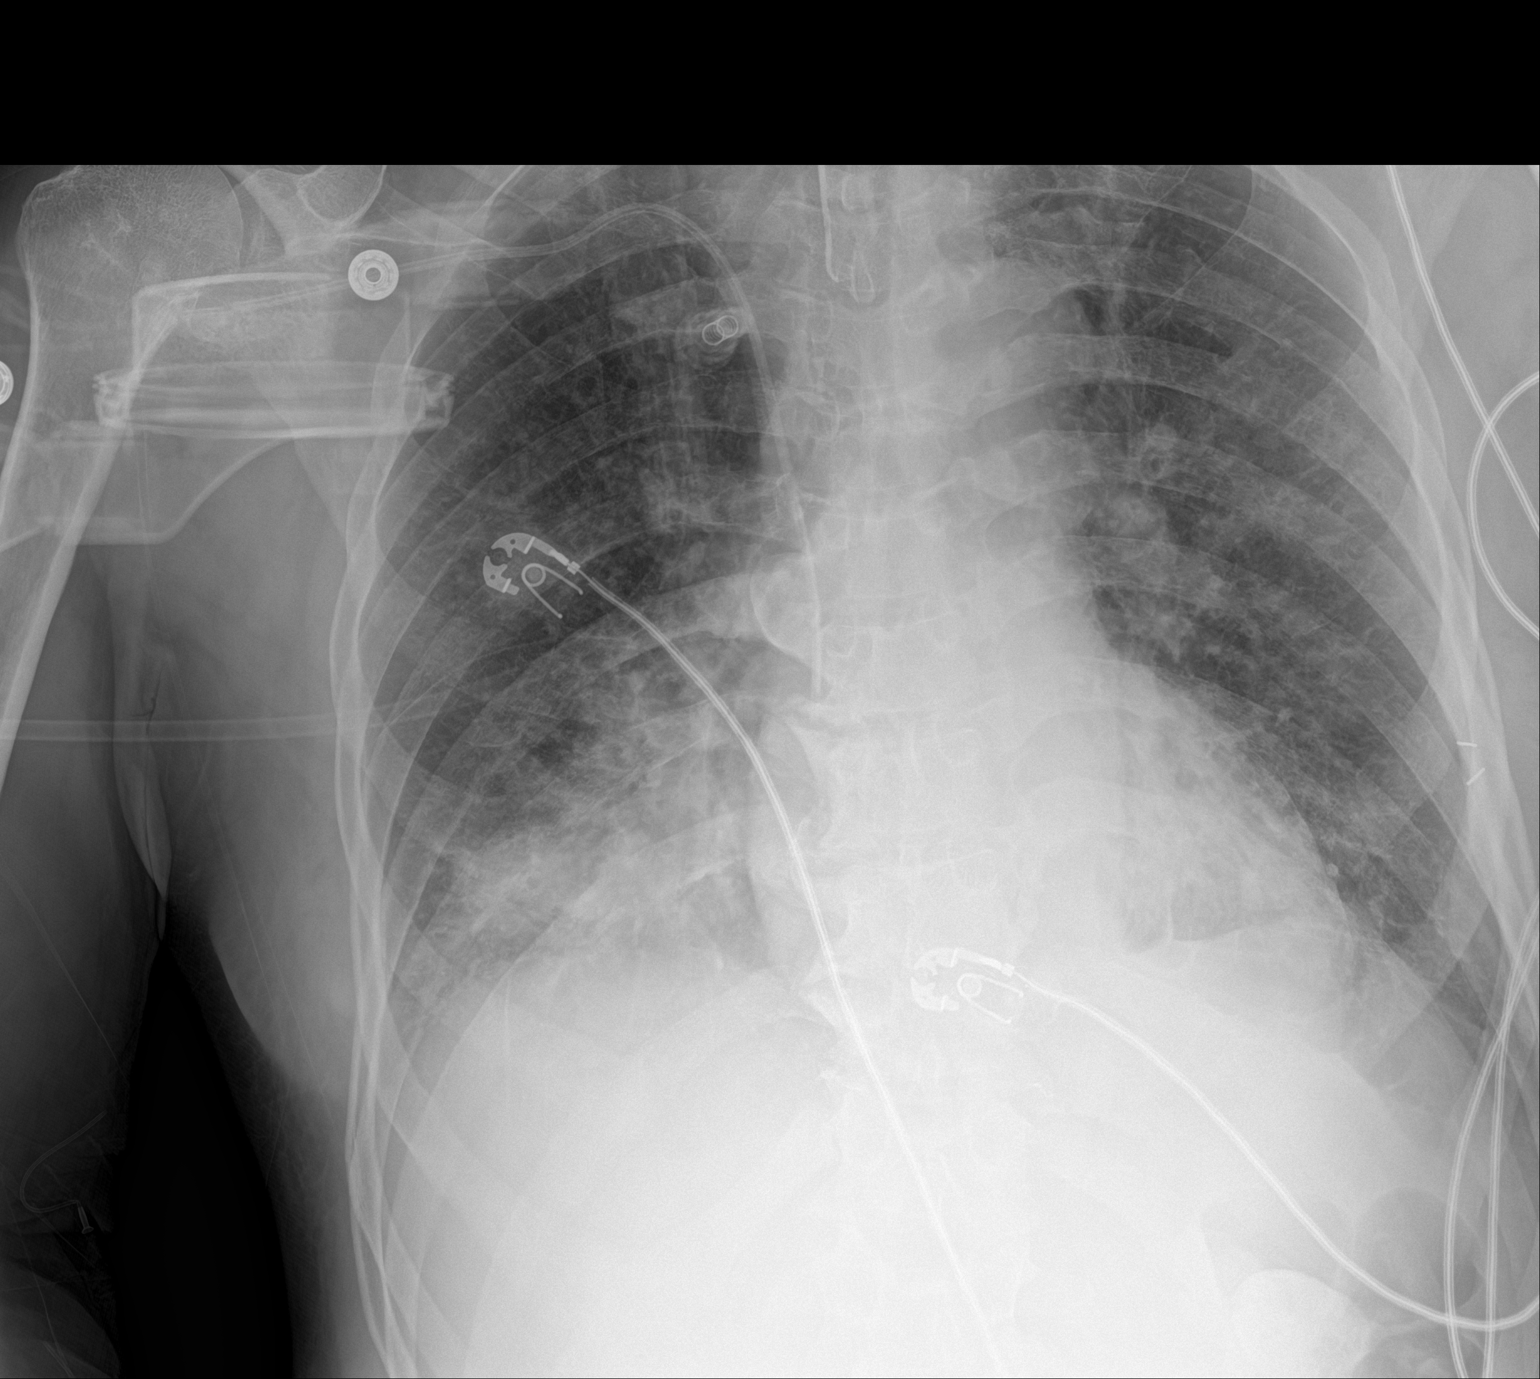

[1 of 1 positions shown; findings below may reference images not displayed]

FINDINGS: Endotracheal to and right subclavian line stable position. Heart
size stable. Persistent right base infiltrate unchanged. Developing
left mid and left base infiltrates cannot be excluded. No prominent
pleural effusion. No pneumothorax. Surgical clips noted over the
left chest.
IMPRESSION: 1. Lines and tubes in stable position.

2. Persistent right base infiltrate. Developing left mid lung and
left base mild infiltrates cannot be excluded.

## 2017-12-11 IMAGING — DX DG CHEST 1V PORT
1 series · 1 of 1 positions shown · non-contrast
Comparison: 10/31/2016

CLINICAL DATA: Respiratory failure .

EXAM:
PORTABLE CHEST 1 VIEW

[chest ap]
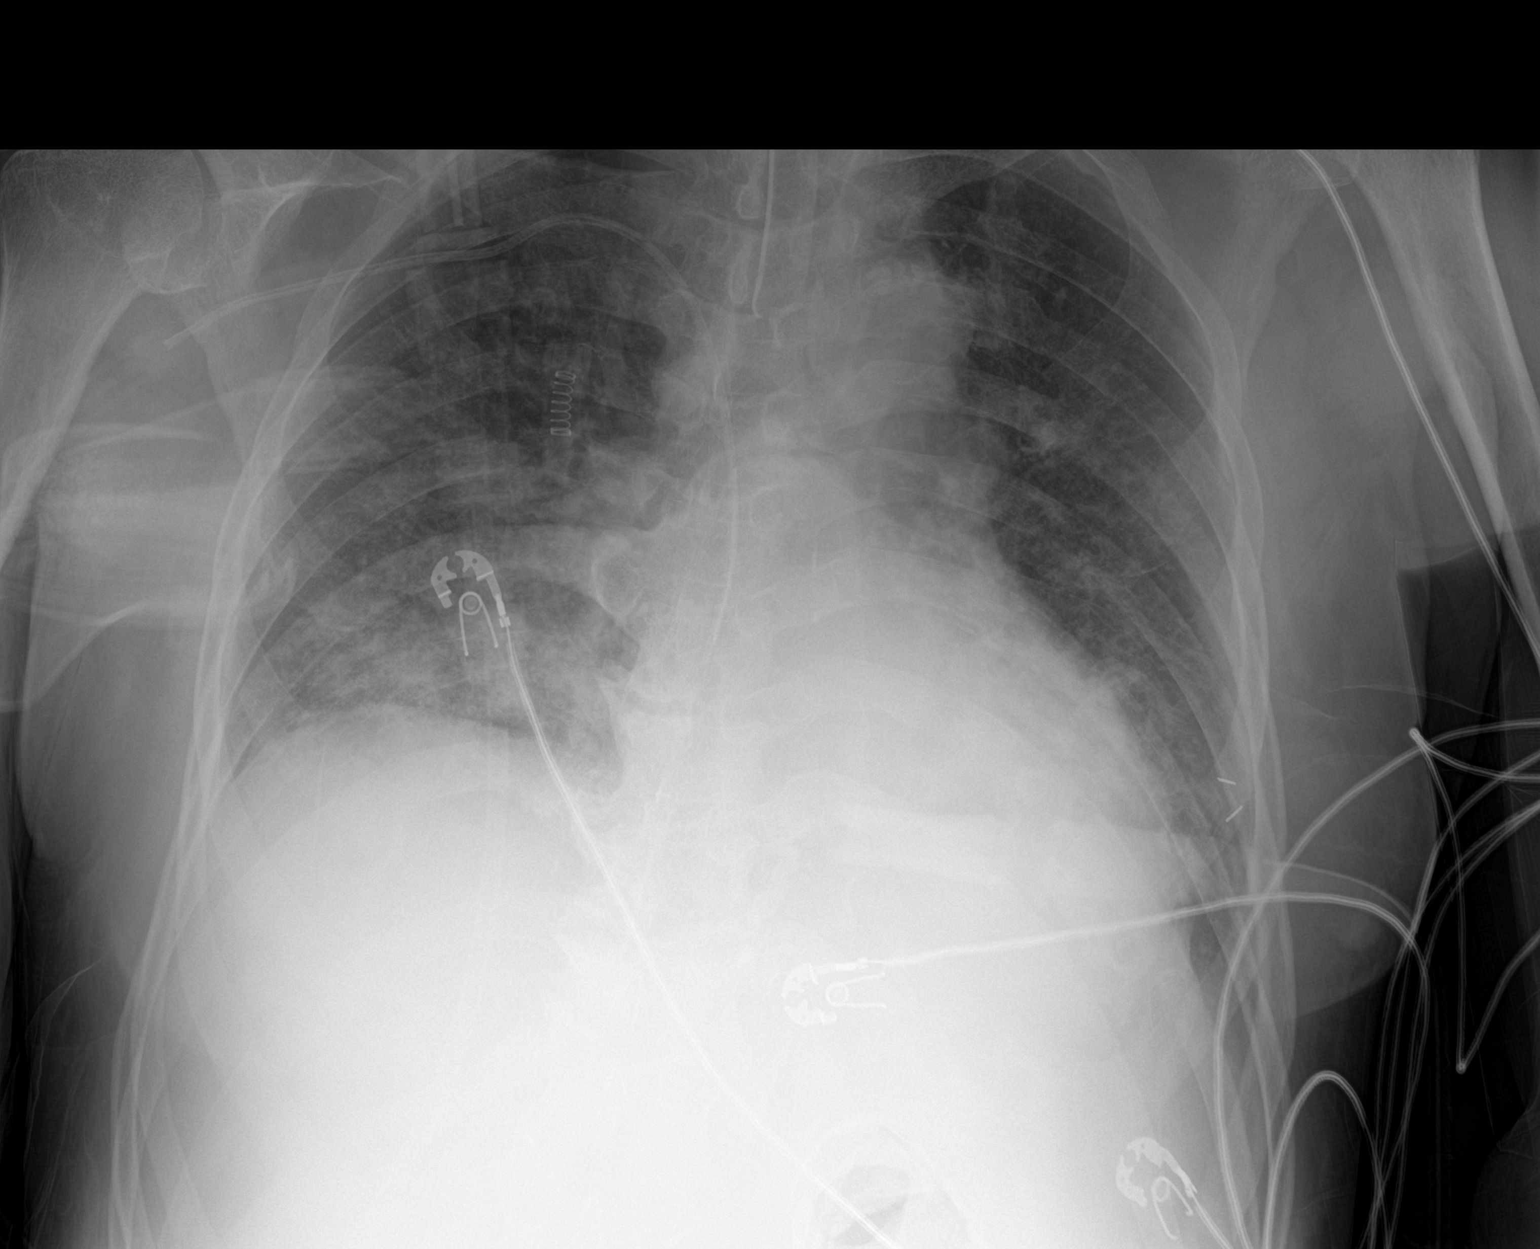

[1 of 1 positions shown; findings below may reference images not displayed]

FINDINGS: Surgical clips upper chest. Endotracheal tube and right subclavian
line in stable position. Heart size stable. Persistent bibasilar
infiltrates again noted. Slight improvement in aeration right lung
base. No pleural effusion or pneumothorax . Posttraumatic deformity
right shoulder again noted. Sclerotic changes in the left humeral
head consistent avascular necrosis again noted.
IMPRESSION: 1.  Lines and tubes in stable position.

2. Persistent bibasilar infiltrates consistent pneumonia. Slight
improvement in aeration in the right lung base from prior day's
exam.

## 2017-12-11 IMAGING — DX DG CHEST 1V PORT
1 series · 1 of 1 positions shown · non-contrast
Comparison: One-view chest x-ray from the same day for the T6 a.m..

CLINICAL DATA: Intubation and bronchoscopy.

EXAM:
PORTABLE CHEST 1 VIEW

[chest ap]
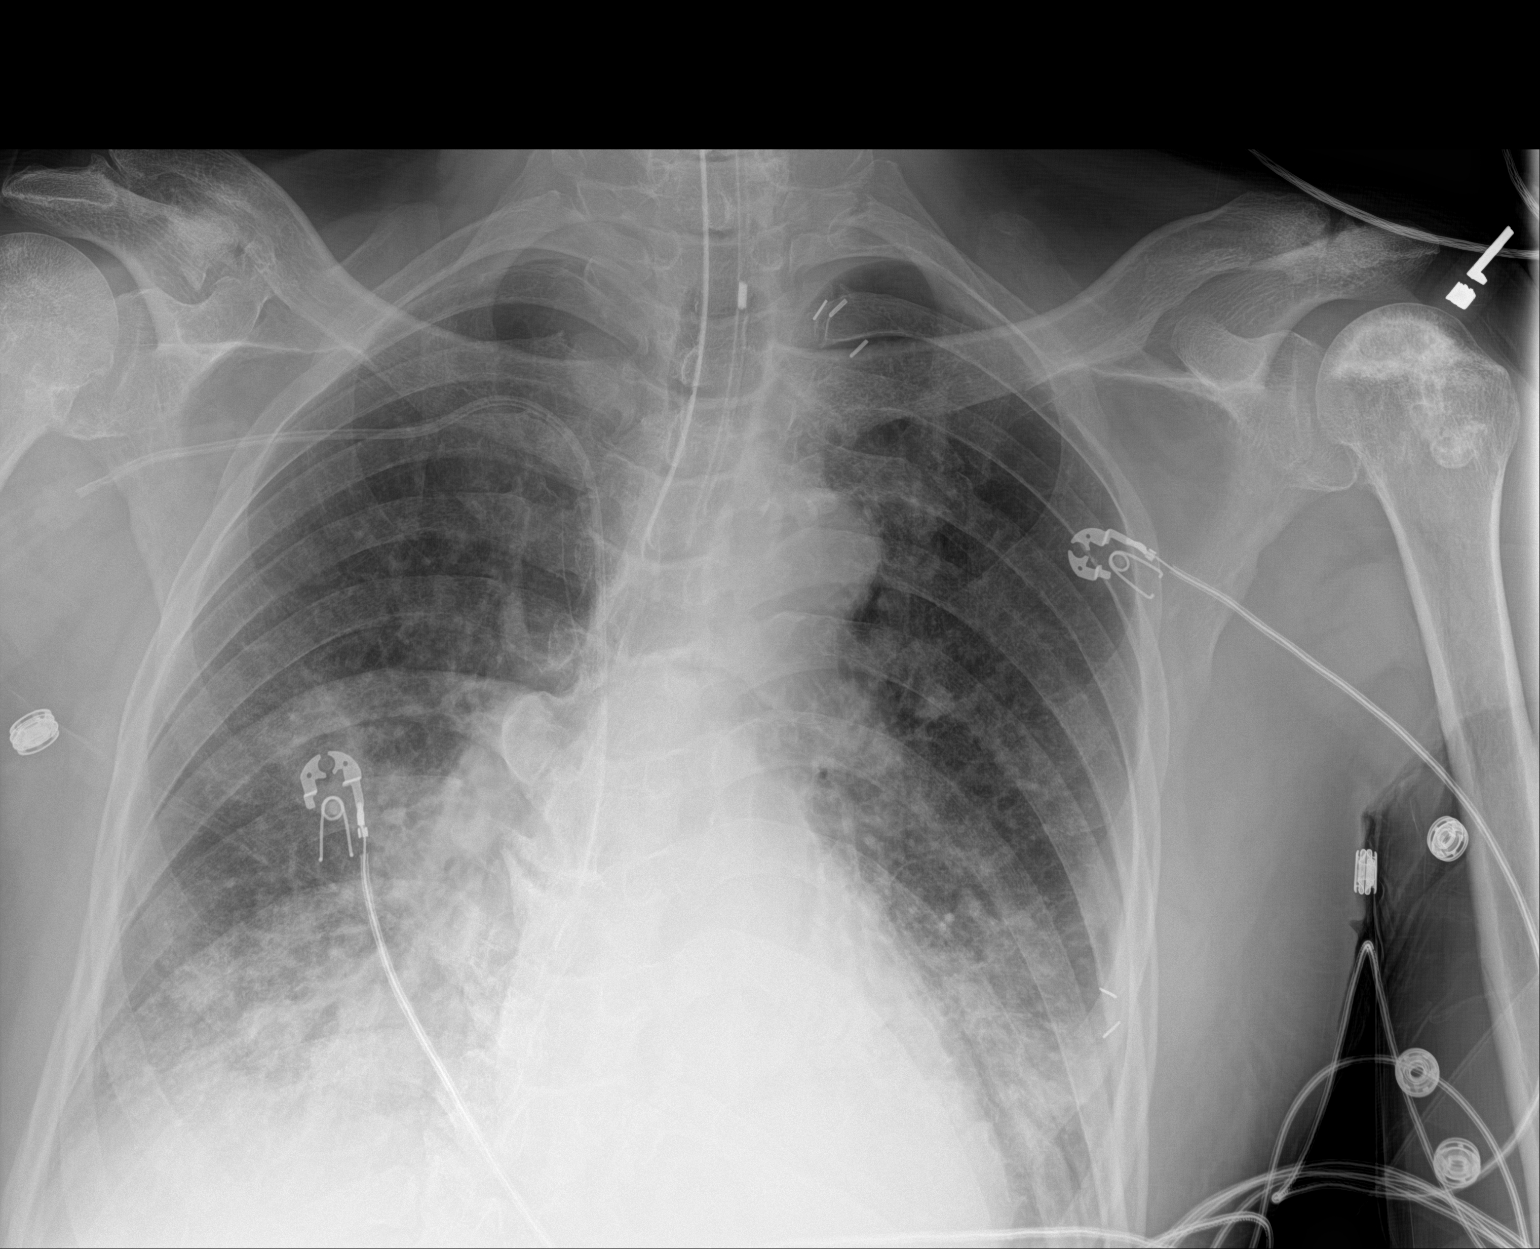

[1 of 1 positions shown; findings below may reference images not displayed]

FINDINGS: Endotracheal tube terminates 3 cm from the carina. A right
subclavian line terminates at the cavoatrial junction. Surgical
clips are present in the left breast. The heart is mildly enlarged.
Edema is stable. Bilateral pleural effusions are suspected.
Bibasilar airspace disease likely reflects atelectasis.
IMPRESSION: 1. Stable and satisfactory positioning of support apparatus.
2. Stable mild cardiac enlargement with edema and bilateral
effusions.
3. Bilateral lower lobe airspace disease reflecting atelectasis or
lobar pneumonia.

## 2017-12-12 IMAGING — DX DG CHEST 1V PORT
1 series · 1 of 1 positions shown · non-contrast
Comparison: 11/01/2016.  10/31/2016 .

CLINICAL DATA: Respiratory failure.

EXAM:
PORTABLE CHEST 1 VIEW

[chest ap]
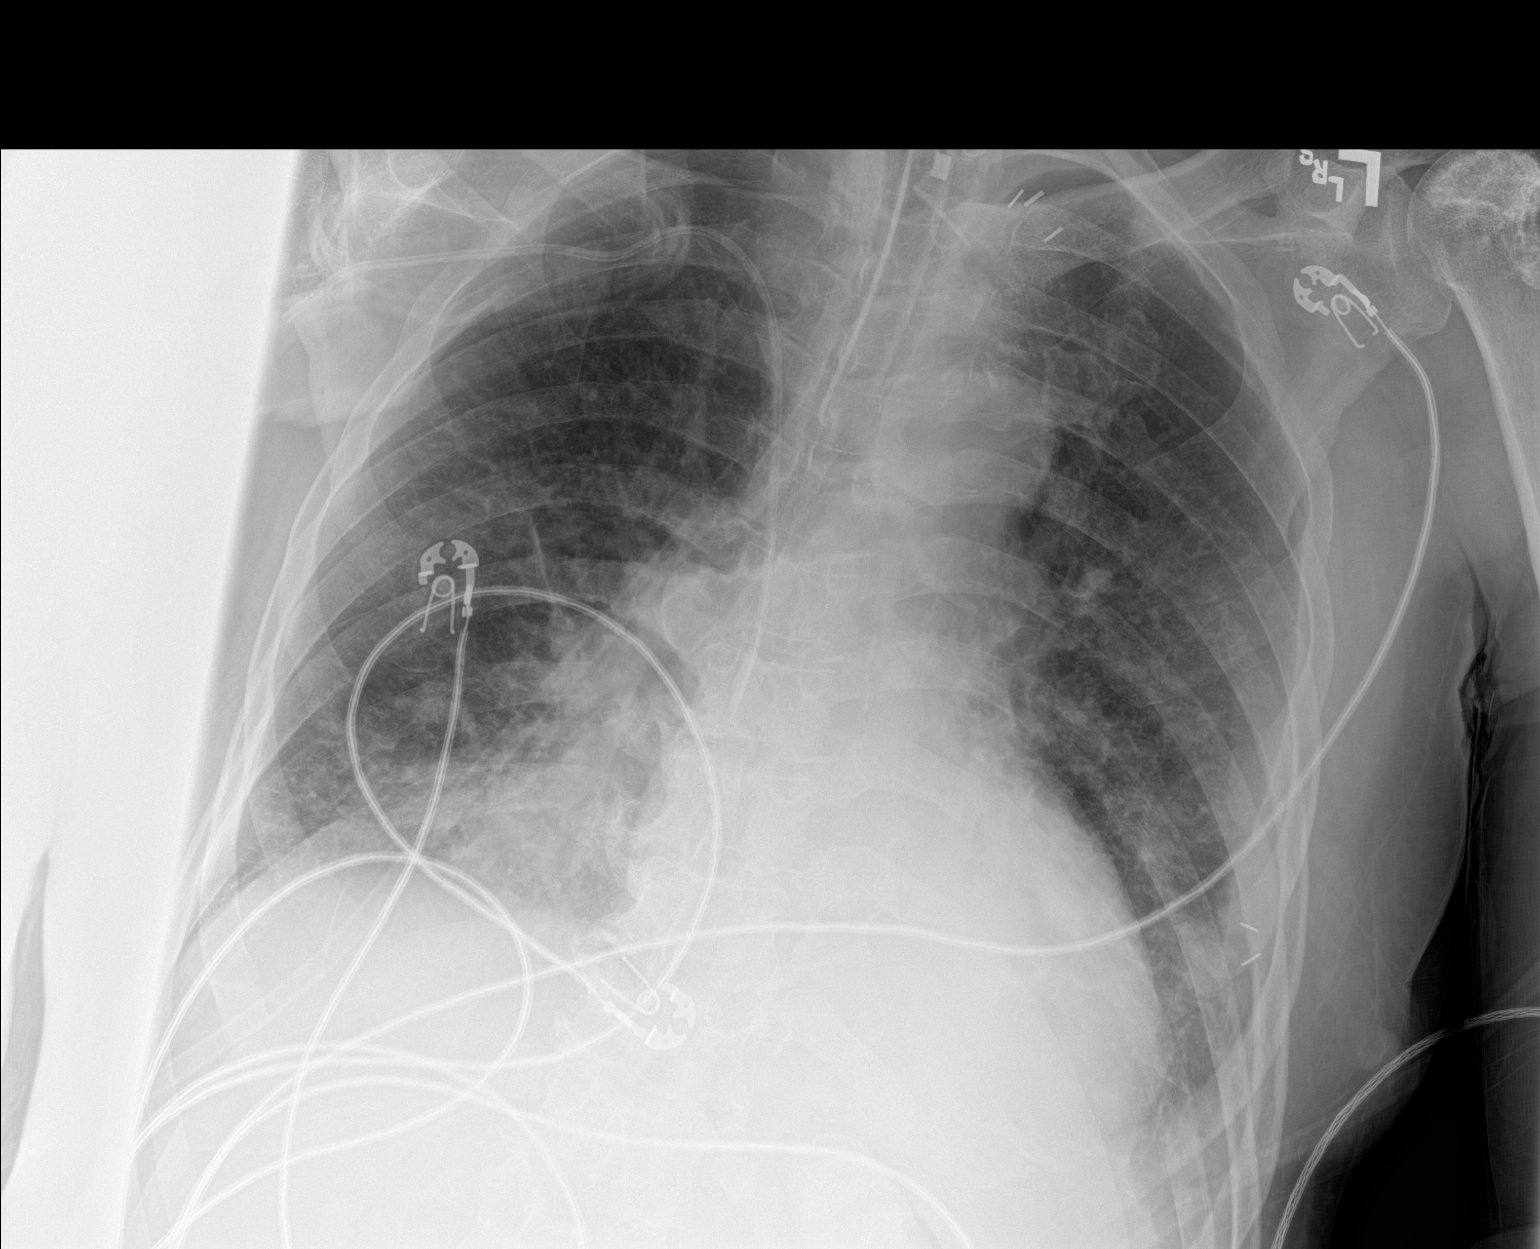

[1 of 1 positions shown; findings below may reference images not displayed]

FINDINGS: Endotracheal tube and right subclavian line stable position. Heart
size stable. Bibasilar pulmonary infiltrates/ edema, improved from
prior exam. Small left pleural effusion. Left costophrenic angle not
completely imaged. No pneumothorax .
IMPRESSION: 1. Lines and tubes in stable position.

2. Persistent bibasilar infiltrates/edema, improved from prior exam.
Small left pleural effusion.

## 2017-12-15 IMAGING — DX DG HAND COMPLETE 3+V*L*
3 series · 3 of 3 positions shown · non-contrast
Comparison: Report from 01/03/1994

CLINICAL DATA: Swelling of the hand.  Diabetes.

EXAM:
LEFT HAND - COMPLETE 3+ VIEW

[hand ap]
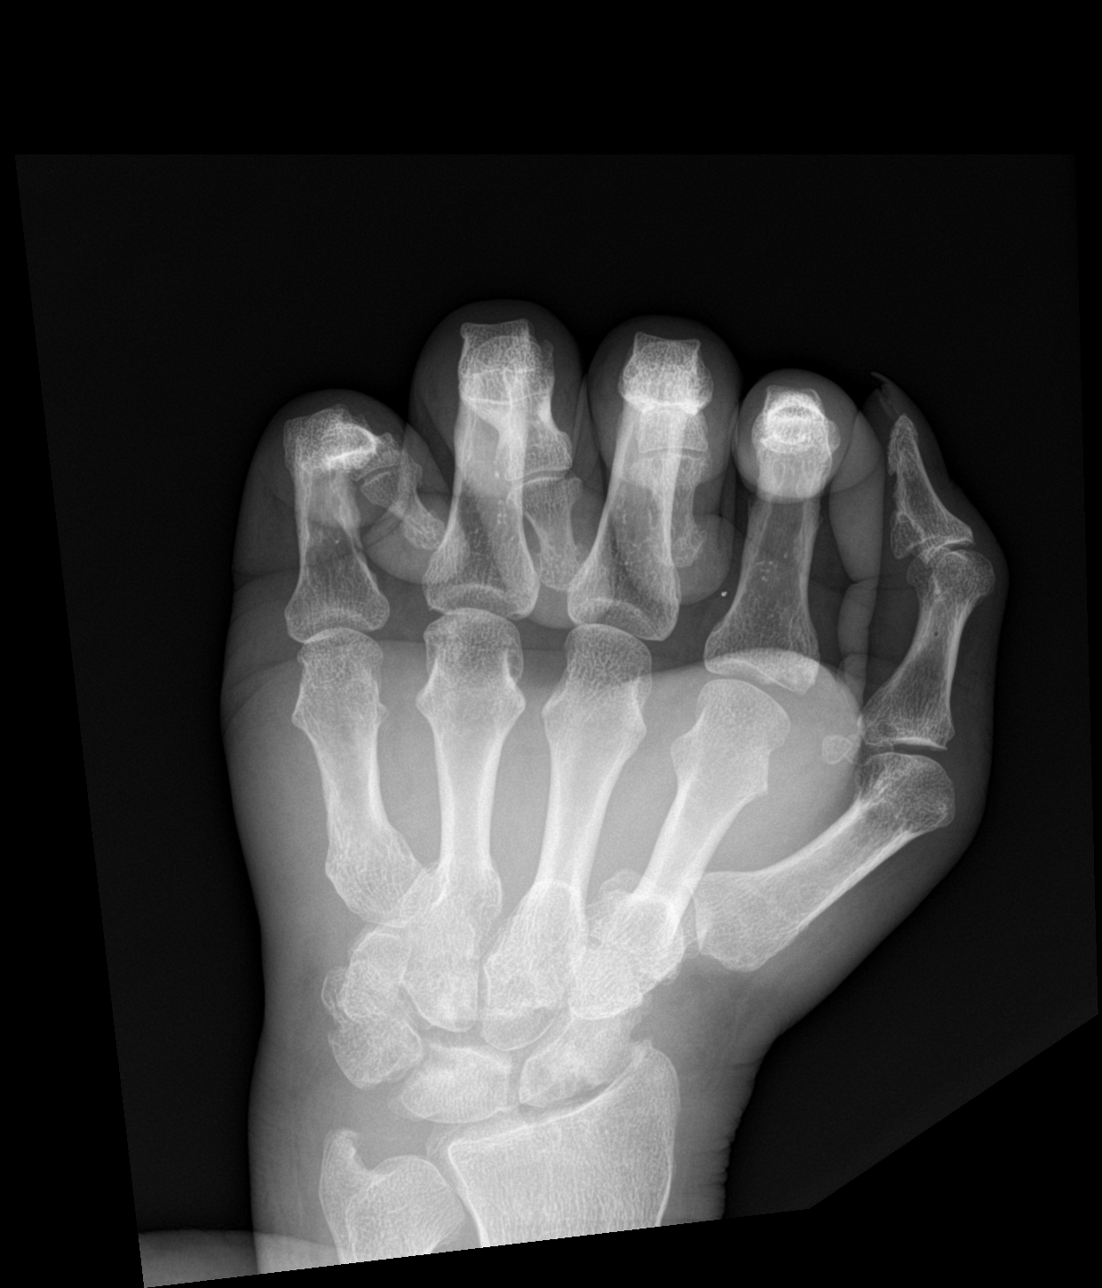

[hand obl]
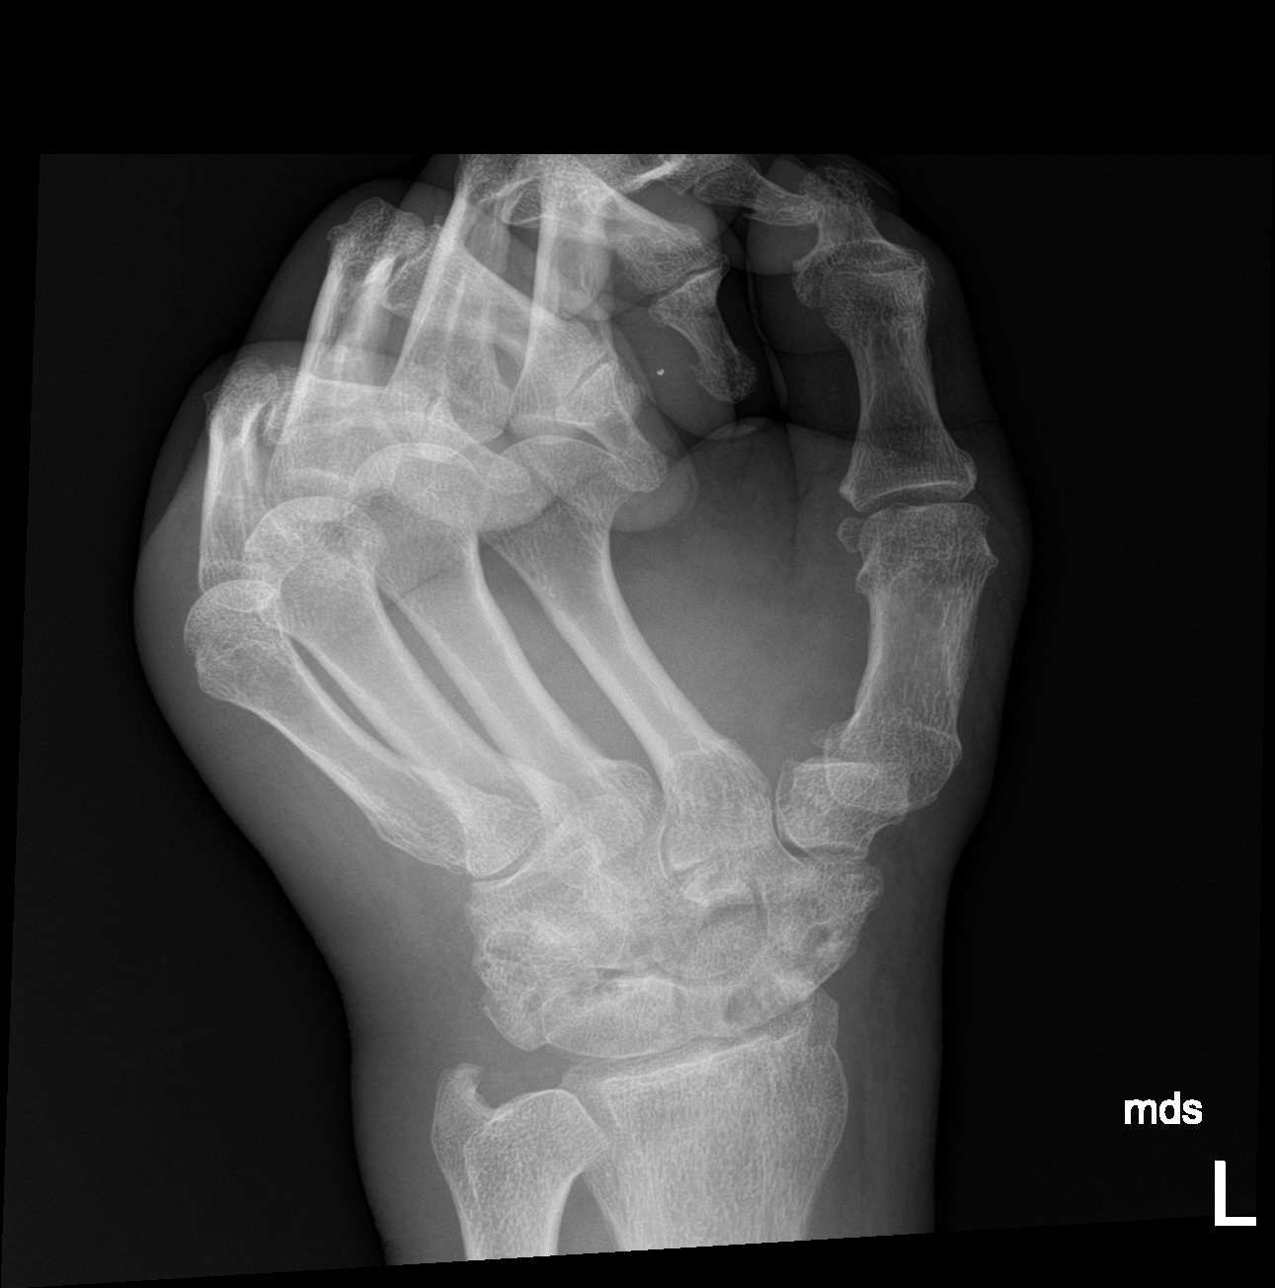

[hand lat]
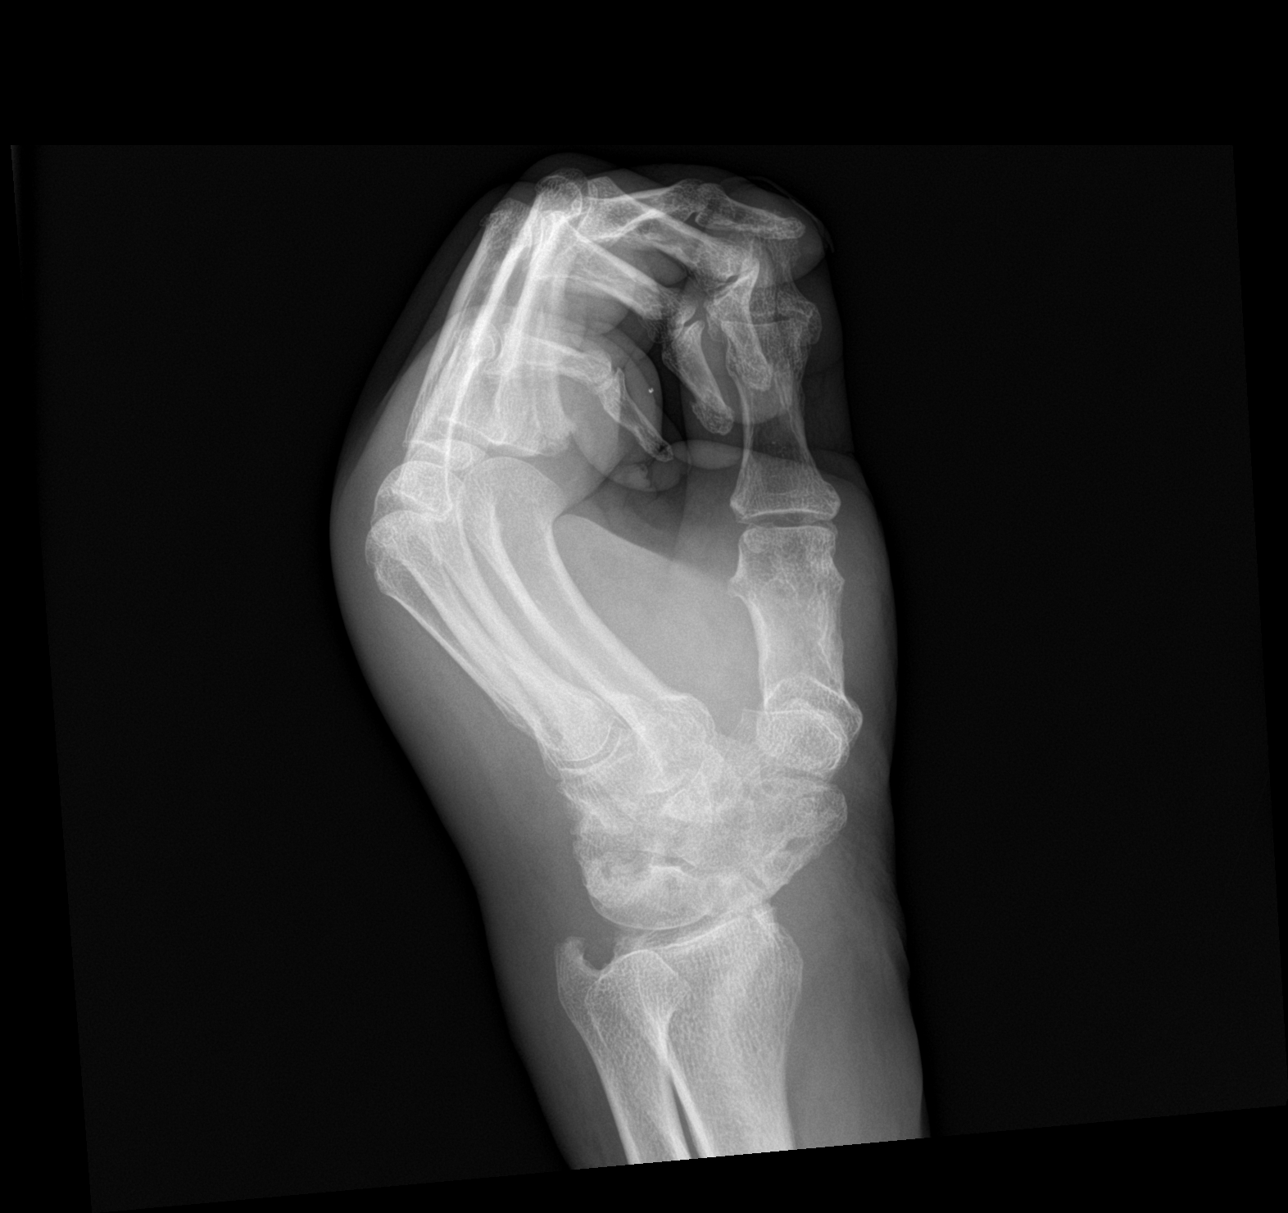

[3 of 3 positions shown; findings below may reference images not displayed]

FINDINGS: The hand is clenched into a fist during imaging, and assessment of
the fingers is markedly limited because of this. The wrist is also
extended during imaging. Nonstandard positioning reduces diagnostic
sensitivity and specificity.

Hypodensities are present in the carpus, favoring erosions along
articular surfaces. Reduced radioscaphoid distance. Small erosion
along the ulnar styloid. Questionable faint calcification in the
TFCC disc.

Soft tissue swelling along the volar wrist on the somewhat oblique
to lateral projection attempt. The formal oblique demonstrates
distorted magnification but confirms findings on the other
projections.

No significant spurring of the metacarpal heads.
IMPRESSION: 1. Erosive arthropathy in the carpus, with questionable TFCC disc
chondrocalcinosis. Interphalangeal joints cannot be readily assessed
due to the patient's hand being imaged in a hinged fist position.
Possibilities include rheumatoid arthritis and gout. CPPD
arthropathy is considered less likely despite the questionable
chondrocalcinosis. I am skeptical of infection given the involvement
of multiple compartments including the midcarpal row and proximal
carpal row, but if there is a high clinical suspicion of infection
based on other indicators such as leukocytosis, fever, or
fluctuance, then cross-sectional imaging may be warranted.

## 2017-12-15 IMAGING — DX DG CHEST 1V PORT
1 series · 1 of 1 positions shown · non-contrast
Comparison: 11/03/2016 and chest CT 07/09/2014

CLINICAL DATA: Acute respiratory failure.  Tracheostomy.

EXAM:
PORTABLE CHEST 1 VIEW

[chest ap]
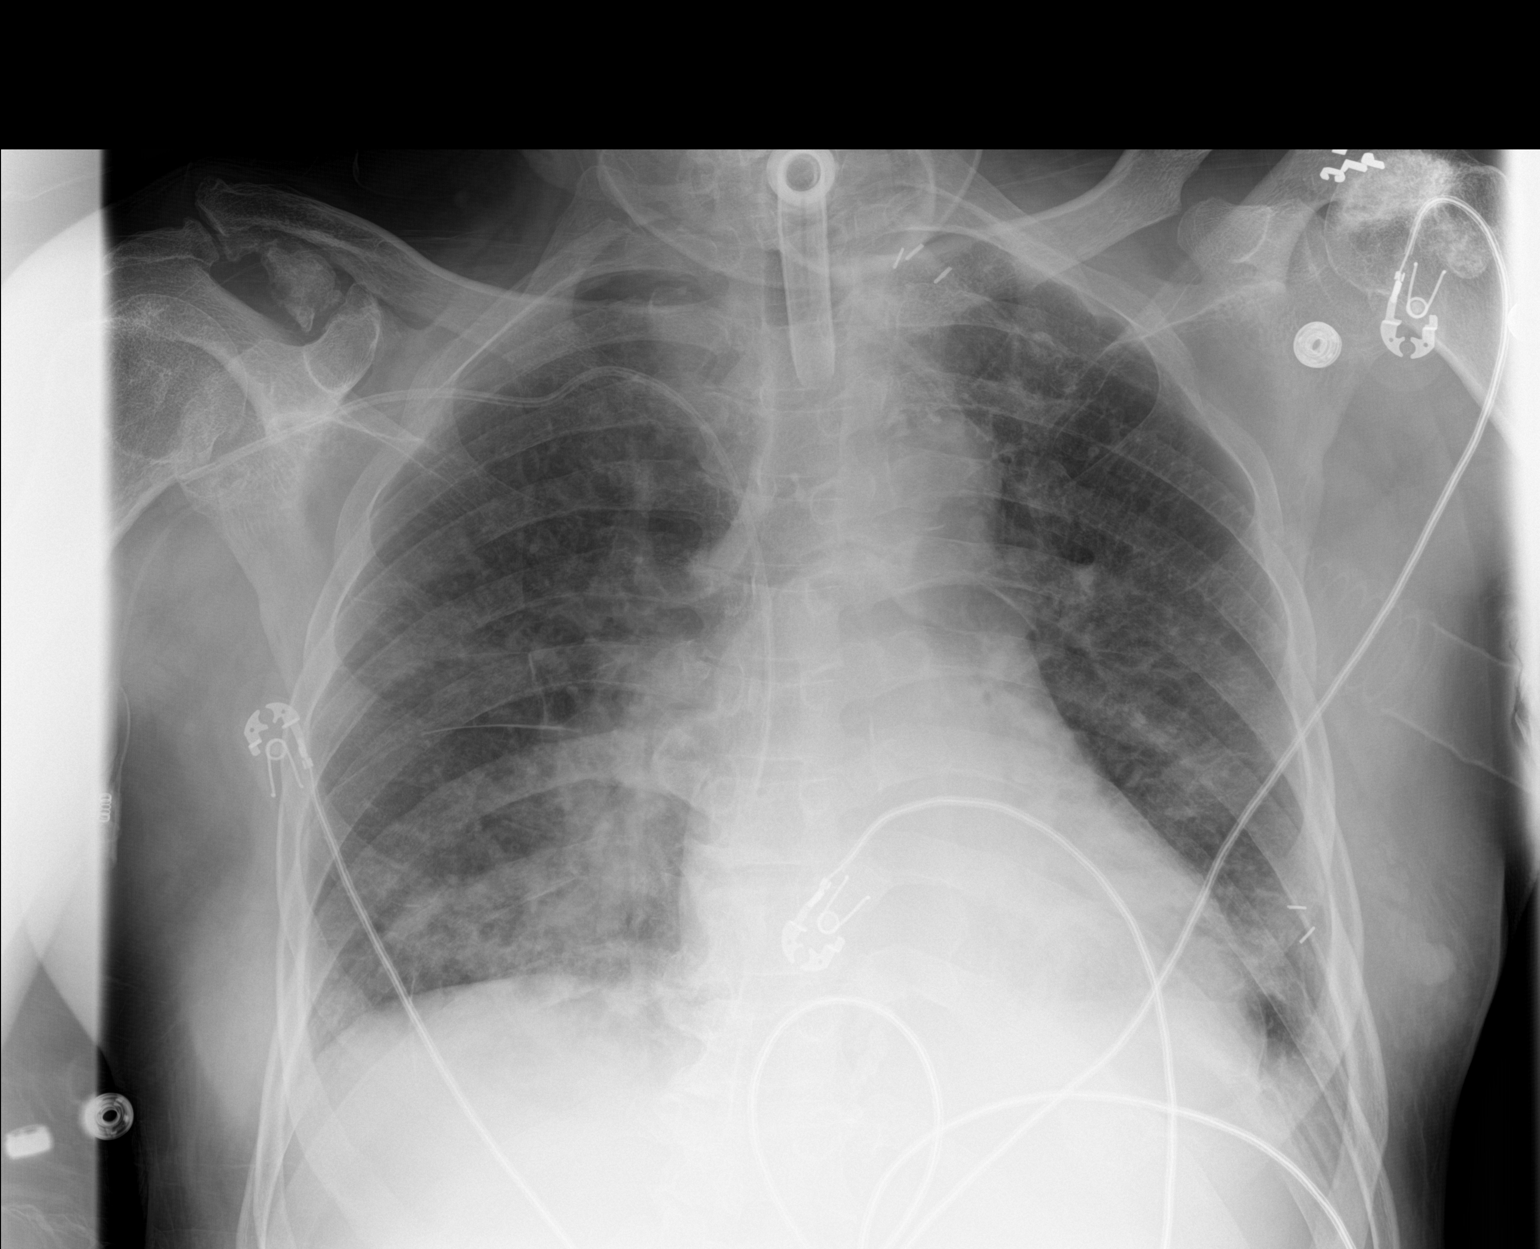

[1 of 1 positions shown; findings below may reference images not displayed]

FINDINGS: Tracheostomy tube in adequate position. Right subclavian central
venous catheter unchanged with tip just below the cavoatrial
junction. Lungs are adequately inflated demonstrate continued
airspace opacification over the lung bases without significant
change. No definite effusion. No pneumothorax. Cardiomediastinal
silhouette is within normal. Sclerotic density over the left humeral
head unchanged likely bone infarct. Remainder of the exam is
unchanged.
IMPRESSION: Bibasilar airspace opacification unchanged and may be due to
infection or atelectasis.

Tubes and lines as described.

## 2017-12-17 ENCOUNTER — Encounter (HOSPITAL_COMMUNITY): Payer: Self-pay | Admitting: Emergency Medicine

## 2017-12-17 ENCOUNTER — Emergency Department (HOSPITAL_COMMUNITY): Payer: Medicare HMO

## 2017-12-17 ENCOUNTER — Inpatient Hospital Stay (HOSPITAL_COMMUNITY)
Admission: EM | Admit: 2017-12-17 | Discharge: 2017-12-21 | DRG: 871 | Disposition: A | Discharge status: Home Health | Payer: Medicare HMO | Attending: Internal Medicine | Admitting: Internal Medicine

## 2017-12-17 DIAGNOSIS — T17910D Gastric contents in respiratory tract, part unspecified causing asphyxiation, subsequent encounter: Secondary | ICD-10-CM

## 2017-12-17 DIAGNOSIS — A419 Sepsis, unspecified organism: Principal | ICD-10-CM | POA: Diagnosis present

## 2017-12-17 DIAGNOSIS — R1310 Dysphagia, unspecified: Secondary | ICD-10-CM | POA: Diagnosis present

## 2017-12-17 DIAGNOSIS — Z7401 Bed confinement status: Secondary | ICD-10-CM

## 2017-12-17 DIAGNOSIS — T17918A Gastric contents in respiratory tract, part unspecified causing other injury, initial encounter: Secondary | ICD-10-CM

## 2017-12-17 DIAGNOSIS — Z79899 Other long term (current) drug therapy: Secondary | ICD-10-CM

## 2017-12-17 DIAGNOSIS — R112 Nausea with vomiting, unspecified: Secondary | ICD-10-CM | POA: Diagnosis present

## 2017-12-17 DIAGNOSIS — N183 Chronic kidney disease, stage 3 (moderate): Secondary | ICD-10-CM | POA: Diagnosis present

## 2017-12-17 DIAGNOSIS — I10 Essential (primary) hypertension: Secondary | ICD-10-CM | POA: Diagnosis present

## 2017-12-17 DIAGNOSIS — I639 Cerebral infarction, unspecified: Secondary | ICD-10-CM | POA: Diagnosis present

## 2017-12-17 DIAGNOSIS — Z6822 Body mass index (BMI) 22.0-22.9, adult: Secondary | ICD-10-CM

## 2017-12-17 DIAGNOSIS — Z7982 Long term (current) use of aspirin: Secondary | ICD-10-CM

## 2017-12-17 DIAGNOSIS — X58XXXA Exposure to other specified factors, initial encounter: Secondary | ICD-10-CM

## 2017-12-17 DIAGNOSIS — Z87891 Personal history of nicotine dependence: Secondary | ICD-10-CM

## 2017-12-17 DIAGNOSIS — K5641 Fecal impaction: Secondary | ICD-10-CM | POA: Diagnosis present

## 2017-12-17 DIAGNOSIS — Z931 Gastrostomy status: Secondary | ICD-10-CM

## 2017-12-17 DIAGNOSIS — Z930 Tracheostomy status: Secondary | ICD-10-CM

## 2017-12-17 DIAGNOSIS — T17990A Other foreign object in respiratory tract, part unspecified in causing asphyxiation, initial encounter: Secondary | ICD-10-CM | POA: Diagnosis present

## 2017-12-17 DIAGNOSIS — E872 Acidosis: Secondary | ICD-10-CM | POA: Diagnosis present

## 2017-12-17 DIAGNOSIS — D631 Anemia in chronic kidney disease: Secondary | ICD-10-CM | POA: Diagnosis present

## 2017-12-17 DIAGNOSIS — E785 Hyperlipidemia, unspecified: Secondary | ICD-10-CM | POA: Diagnosis present

## 2017-12-17 DIAGNOSIS — J9601 Acute respiratory failure with hypoxia: Secondary | ICD-10-CM | POA: Diagnosis present

## 2017-12-17 DIAGNOSIS — Z9889 Other specified postprocedural states: Secondary | ICD-10-CM

## 2017-12-17 DIAGNOSIS — I129 Hypertensive chronic kidney disease with stage 1 through stage 4 chronic kidney disease, or unspecified chronic kidney disease: Secondary | ICD-10-CM | POA: Diagnosis present

## 2017-12-17 DIAGNOSIS — J810 Acute pulmonary edema: Secondary | ICD-10-CM

## 2017-12-17 DIAGNOSIS — K219 Gastro-esophageal reflux disease without esophagitis: Secondary | ICD-10-CM | POA: Diagnosis present

## 2017-12-17 DIAGNOSIS — B449 Aspergillosis, unspecified: Secondary | ICD-10-CM

## 2017-12-17 DIAGNOSIS — N2881 Hypertrophy of kidney: Secondary | ICD-10-CM | POA: Diagnosis present

## 2017-12-17 DIAGNOSIS — I700 Atherosclerosis of aorta: Secondary | ICD-10-CM | POA: Diagnosis present

## 2017-12-17 DIAGNOSIS — Z0189 Encounter for other specified special examinations: Secondary | ICD-10-CM

## 2017-12-17 DIAGNOSIS — R0602 Shortness of breath: Secondary | ICD-10-CM

## 2017-12-17 DIAGNOSIS — J9602 Acute respiratory failure with hypercapnia: Secondary | ICD-10-CM

## 2017-12-17 DIAGNOSIS — R1110 Vomiting, unspecified: Secondary | ICD-10-CM | POA: Diagnosis not present

## 2017-12-17 DIAGNOSIS — Z8673 Personal history of transient ischemic attack (TIA), and cerebral infarction without residual deficits: Secondary | ICD-10-CM

## 2017-12-17 DIAGNOSIS — Z4659 Encounter for fitting and adjustment of other gastrointestinal appliance and device: Secondary | ICD-10-CM

## 2017-12-17 DIAGNOSIS — Z7902 Long term (current) use of antithrombotics/antiplatelets: Secondary | ICD-10-CM

## 2017-12-17 DIAGNOSIS — Z515 Encounter for palliative care: Secondary | ICD-10-CM | POA: Diagnosis present

## 2017-12-17 DIAGNOSIS — J9622 Acute and chronic respiratory failure with hypercapnia: Secondary | ICD-10-CM | POA: Diagnosis present

## 2017-12-17 DIAGNOSIS — J690 Pneumonitis due to inhalation of food and vomit: Secondary | ICD-10-CM | POA: Diagnosis present

## 2017-12-17 DIAGNOSIS — J9621 Acute and chronic respiratory failure with hypoxia: Secondary | ICD-10-CM | POA: Diagnosis present

## 2017-12-17 DIAGNOSIS — Z86010 Personal history of colonic polyps: Secondary | ICD-10-CM

## 2017-12-17 DIAGNOSIS — T17910A Gastric contents in respiratory tract, part unspecified causing asphyxiation, initial encounter: Secondary | ICD-10-CM

## 2017-12-17 DIAGNOSIS — Z993 Dependence on wheelchair: Secondary | ICD-10-CM

## 2017-12-17 DIAGNOSIS — K5900 Constipation, unspecified: Secondary | ICD-10-CM

## 2017-12-17 DIAGNOSIS — E440 Moderate protein-calorie malnutrition: Secondary | ICD-10-CM | POA: Diagnosis present

## 2017-12-17 LAB — CBC WITH DIFFERENTIAL/PLATELET
Abs Immature Granulocytes: 0.1 10*3/uL (ref 0.0–0.1)
Basophils Absolute: 0.1 10*3/uL (ref 0.0–0.1)
Basophils Relative: 1 %
Eosinophils Absolute: 0.4 10*3/uL (ref 0.0–0.7)
Eosinophils Relative: 3 %
HCT: 49.9 % (ref 39.0–52.0)
Hemoglobin: 15.5 g/dL (ref 13.0–17.0)
Immature Granulocytes: 0 %
Lymphocytes Relative: 8 %
Lymphs Abs: 1.2 10*3/uL (ref 0.7–4.0)
MCH: 29.8 pg (ref 26.0–34.0)
MCHC: 31.1 g/dL (ref 30.0–36.0)
MCV: 96 fL (ref 78.0–100.0)
Monocytes Absolute: 1.6 10*3/uL — ABNORMAL HIGH (ref 0.1–1.0)
Monocytes Relative: 11 %
Neutro Abs: 10.6 10*3/uL — ABNORMAL HIGH (ref 1.7–7.7)
Neutrophils Relative %: 77 %
Platelets: 318 10*3/uL (ref 150–400)
RBC: 5.2 MIL/uL (ref 4.22–5.81)
RDW: 15.1 % (ref 11.5–15.5)
WBC: 13.9 10*3/uL — ABNORMAL HIGH (ref 4.0–10.5)

## 2017-12-17 LAB — COMPREHENSIVE METABOLIC PANEL
ALT: 16 U/L (ref 0–44)
AST: 21 U/L (ref 15–41)
Albumin: 3.2 g/dL — ABNORMAL LOW (ref 3.5–5.0)
Alkaline Phosphatase: 101 U/L (ref 38–126)
Anion gap: 8 (ref 5–15)
BUN: 37 mg/dL — ABNORMAL HIGH (ref 8–23)
CO2: 31 mmol/L (ref 22–32)
Calcium: 10 mg/dL (ref 8.9–10.3)
Chloride: 99 mmol/L (ref 98–111)
Creatinine, Ser: 1.03 mg/dL (ref 0.61–1.24)
GFR calc Af Amer: >60 mL/min (ref >60)
GFR calc non Af Amer: >60 mL/min (ref >60)
Glucose, Bld: 88 mg/dL (ref 70–99)
Potassium: 4.7 mmol/L (ref 3.5–5.1)
Sodium: 138 mmol/L (ref 135–145)
Total Bilirubin: 0.8 mg/dL (ref 0.3–1.2)
Total Protein: 8.2 g/dL — ABNORMAL HIGH (ref 6.5–8.1)

## 2017-12-17 LAB — TYPE AND SCREEN
ABO/RH(D): O POS
Antibody Screen: NEGATIVE

## 2017-12-17 LAB — PROTIME-INR
INR: 1.03
Prothrombin Time: 13.4 seconds (ref 11.4–15.2)

## 2017-12-17 LAB — I-STAT CG4 LACTIC ACID, ED
Lactic Acid, Venous: 0.99 mmol/L (ref 0.5–1.9)
Lactic Acid, Venous: 0.86 mmol/L (ref 0.5–1.9)

## 2017-12-17 IMAGING — DX DG CHEST 1V PORT
1 series · 1 of 1 positions shown · non-contrast
Comparison: 11/05/2016

CLINICAL DATA: Aspiration pneumonia.

EXAM:
PORTABLE CHEST 1 VIEW

[chest ap]
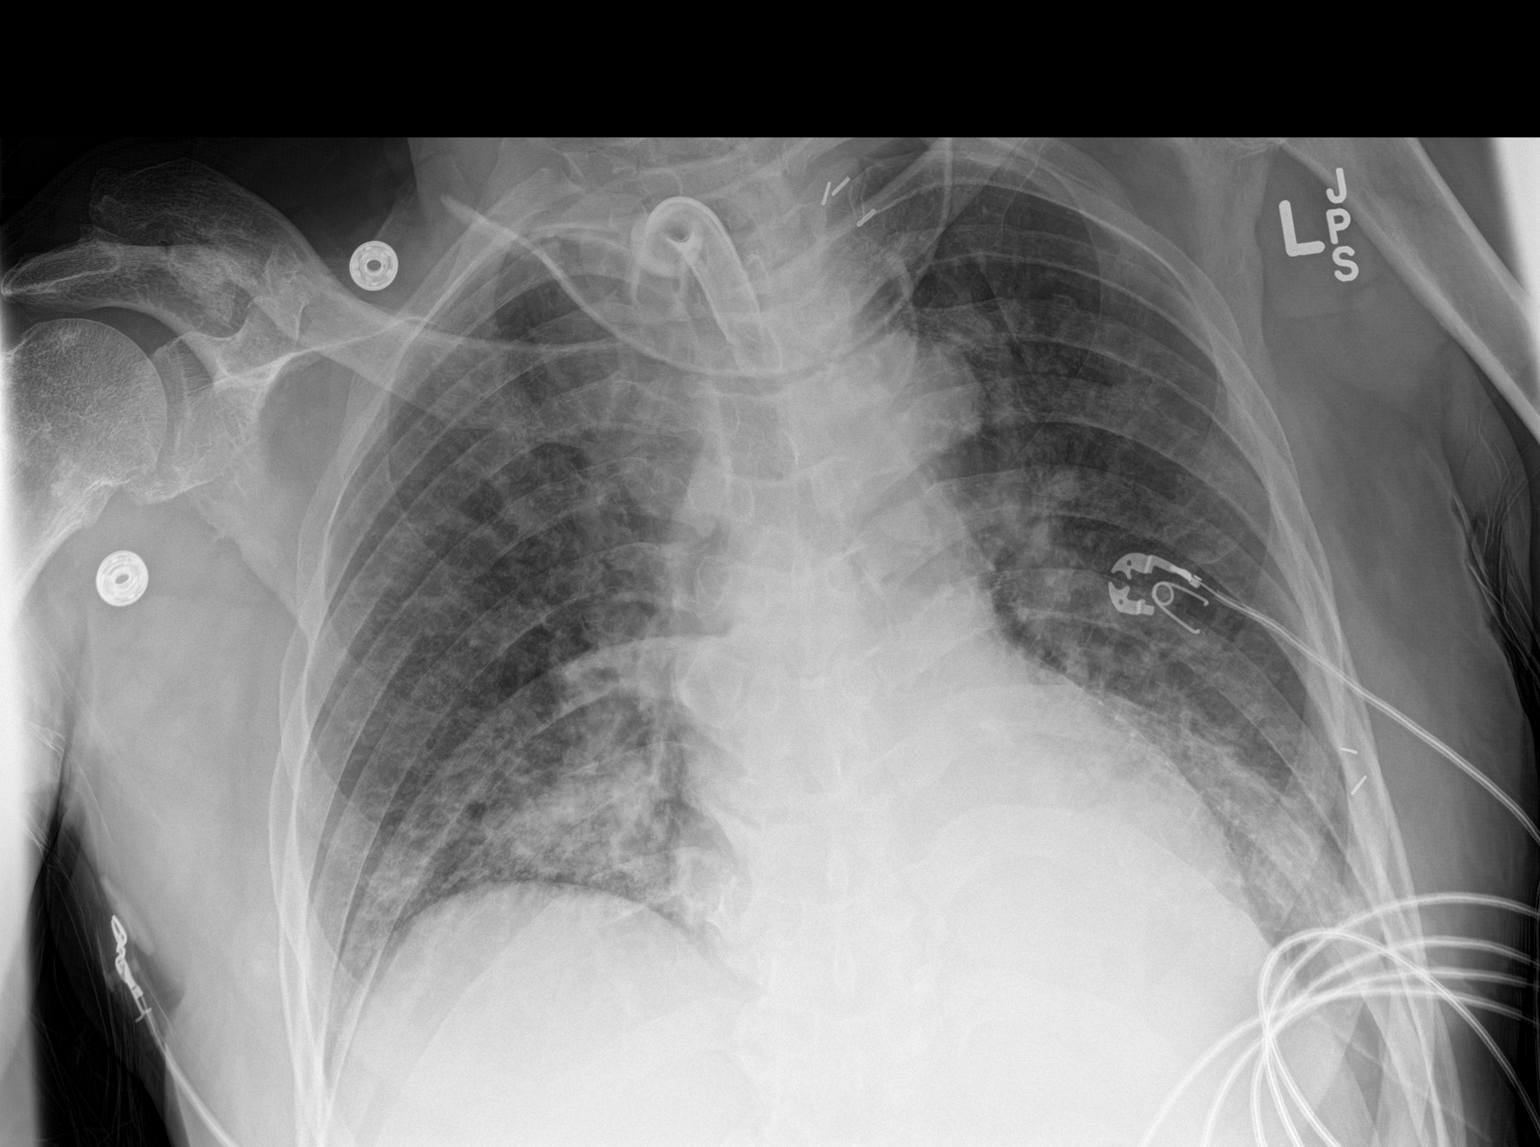

[1 of 1 positions shown; findings below may reference images not displayed]

FINDINGS: Tracheostomy tube overlies the airway. Right subclavian catheter has
been removed. The cardiomediastinal silhouette is unchanged. Patchy
airspace opacities are again seen in both lung bases, stable to
minimally improved from the prior study. No sizable pleural effusion
or pneumothorax is identified. Surgical clips project over the left
lower neck and left chest wall. Well-defined sclerosis is again
partially visualized in the left humeral head.
IMPRESSION: Stable to minimal improvement of bibasilar airspace disease.

## 2017-12-17 MED ORDER — SODIUM CHLORIDE 0.9 % IV SOLN
3.0000 g | Freq: Once | INTRAVENOUS | Status: AC
  Administered 2017-12-17: 3 g via INTRAVENOUS
  Filled 2017-12-17: qty 3

## 2017-12-17 MED ORDER — IOPAMIDOL (ISOVUE-300) INJECTION 61%
100.0000 mL | Freq: Once | INTRAVENOUS | Status: AC | PRN
  Administered 2017-12-17: 100 mL via INTRAVENOUS

## 2017-12-17 MED ORDER — SODIUM CHLORIDE 0.9 % IV SOLN
3.0000 g | Freq: Four times a day (QID) | INTRAVENOUS | Status: DC
  Administered 2017-12-18 – 2017-12-21 (×14): 3 g via INTRAVENOUS
  Filled 2017-12-17 (×15): qty 3

## 2017-12-17 MED ORDER — IOPAMIDOL (ISOVUE-300) INJECTION 61%
INTRAVENOUS | Status: AC
  Administered 2017-12-17: 21:00:00
  Filled 2017-12-17: qty 100

## 2017-12-17 MED ORDER — ONDANSETRON HCL 4 MG/2ML IJ SOLN
4.0000 mg | Freq: Once | INTRAMUSCULAR | Status: AC
  Administered 2017-12-17: 4 mg via INTRAVENOUS
  Filled 2017-12-17: qty 2

## 2017-12-17 NOTE — ED Notes (Signed)
Pt switched to Venti mask for transport to CT

## 2017-12-17 NOTE — Progress Notes (Signed)
Pharmacy Antibiotic Note  Scott Rivera is a 67 y.o. male admitted on 12/17/2017 with emesis and concern for aspiration PNA.  Pharmacy has been consulted for Unasyn dosing.  Plan: Unasyn 3g IV q6h Monitor LOT, renal funx, cultures  Weight: 165 lb (74.8 kg)  Temp (24hrs), Avg:98.7 F (37.1 C), Min:98.7 F (37.1 C), Max:98.7 F (37.1 C)  Recent Labs  Lab 12/17/17 1952 12/17/17 2012  WBC 13.9*  --   LATICACIDVEN  --  0.99    CrCl cannot be calculated (Patient's most recent lab result is older than the maximum 21 days allowed.).    No Known Allergies  Antimicrobials this admission: Unasyn 9/2 >>   Dose adjustments this admission: none  Microbiology results: ordered  Thank you for allowing pharmacy to be a part of this patient's care.  Arrie Senate, PharmD, BCPS Clinical Pharmacist [phone] Please check AMION for all [facility] numbers 12/17/2017

## 2017-12-17 NOTE — ED Notes (Signed)
02 sats in low 80's with good wave, RT at bedside, pt suctioned, RT used BV to ventilate then suctioned, large amount of phlegm loosened then suctioned.  Phlegm now has dark streaks, MD notified

## 2017-12-17 NOTE — ED Provider Notes (Signed)
[facility] Provider Note   CSN: [id number] Arrival date & time: 12/17/17  1932  LEVEL 5 CAVEAT - NONVERBAL/TRACH   History   Chief Complaint Chief Complaint  Patient presents with  . Emesis    HPI Scott SHANNAHAN is a 67 y.o. male.  HPI  67 year old male with a history of prior stroke, current trach, Foley catheter, and gastrostomy tube presents with vomiting.  History is taken from family at bedside.  Patient was noted to have a little bit of what appeared to be emesis around his trach yesterday.  Vomited once today and then vomited again at the pool today.  Has been dark and possibly black.  Patient has not had any coughing.  He is currently on humidified oxygen but no significant O2 requirement at home.  The patient looks at me but does not often respond when asked specific questions.  He has not been complaining of any abdominal pain.  No obvious blood coming from his G-tube.  Past Medical History:  Diagnosis Date  . Anemia due to chronic kidney disease   . Aspiration pneumonia ([place])   . Chronic kidney disease, stage 3 (moderate) (HCC)   . Hypertension   . Stroke [facility])     Patient Active Problem List   Diagnosis Date Noted  . Malnutrition of moderate degree 11/19/2017  . Aspiration pneumonia ([place]) 11/16/2017  . Nausea and vomiting 11/16/2017  . Hyperkalemia 06/26/2017  . Hematuria 06/26/2017  . Bronchitis/??? Pneumonia 03/30/2017  . S/P percutaneous endoscopic gastrostomy (PEG) tube placement ([place]) 03/30/2017  . Chronic anemia 03/30/2017  . Foley catheter in place on admission 03/30/2017  . UTI (urinary tract infection) 03/30/2017  . MDR Acinetobacter baumannii infection   . Gastrostomy tube obstruction ([place])   . PEG tube malfunction ([place])   . Acute respiratory failure with hypoxia ([place]) 03/02/2017  . Loculated pleural effusion   . Acute respiratory distress   . Fever   . Follow up   . HCAP (healthcare-associated pneumonia)    . Stage III pressure ulcer of sacral region ([place]) 12/17/2016  . Hypoxemia   . H/O tracheostomy   . Pneumothorax   . Pneumothorax, closed, traumatic, initial encounter 12/10/2016  . Sepsis ([place]) 12/09/2016  . Hypotension 12/09/2016  . Acute-on-chronic kidney injury ([place]) 12/09/2016  . Elevated troponin 12/09/2016  . Anemia due to chronic kidney disease 12/09/2016  . Compensated metabolic alkalosis 84/16/6063  . Pneumonia   . Goals of care, counseling/discussion   . Palliative care by specialist   . Respiratory failure ([place])   . Aspiration into airway   . Septic shock ([place])   . Dysphagia, post-stroke   . Benign essential HTN   . Tobacco abuse   . Tachypnea   . Hyperglycemia   . Hypernatremia   . Leukocytosis (leucocytosis)   . Acute blood loss anemia   . Chronic kidney disease   . Pressure injury of skin 10/20/2016  . Cerebellar infarct ([place]) 10/19/2016  . ARF (acute renal failure) ([place]) 10/19/2016  . Essential hypertension 10/19/2016  . Rhabdomyolysis 10/19/2016  . History of completed stroke 10/19/2016  . Bilateral chronic knee pain 01/07/2016  . History of adenomatous polyp of colon 07/28/2015  . Hyperlipidemia 07/28/2013  . Helicobacter pylori infection 09/21/2012    Past Surgical History:  Procedure Laterality Date  . IR GASTROSTOMY TUBE MOD SED  10/27/2016  . IR PATIENT EVAL TECH 0-60 MINS  01/05/2017  . IR PATIENT EVAL TECH 0-60 MINS  01/09/2017  . IR PATIENT EVAL TECH 0-60 MINS  03/03/2017  . IR PATIENT EVAL TECH 0-60 MINS  04/03/2017  . IR REPLACE G-TUBE SIMPLE WO FLUORO  11/20/2017  . IR [place] TUBE PERCUT W/FLUORO  07/23/2017  . KNEE SURGERY    . TRACHEOSTOMY TUBE PLACEMENT          Home Medications    Prior to Admission medications   Medication Sig Start Date End Date Taking? Authorizing Provider  acetaminophen (TYLENOL) 500 MG tablet Place 1,000 mg into feeding tube 3 (three) times daily.     [provider]  Amino Acids-Protein  Hydrolys (FEEDING SUPPLEMENT, PRO-STAT SUGAR FREE 64,) LIQD Take 30 mLs by mouth at bedtime.    [provider]  amLODipine (NORVASC) 10 MG tablet Place 1 tablet (10 mg total) into feeding tube daily. 11/23/17   Kayleen Memos, DO  aspirin 81 MG chewable tablet Place 1 tablet (81 mg total) into feeding tube daily. Patient taking differently: Place 325 mg into feeding tube daily.  04/05/17   Nita Sells, MD  atropine 1 % ophthalmic solution Place 2 drops under the tongue every 8 (eight) hours as needed (excessive secretion). 07/03/17   Lavina Hamman, MD  cloNIDine (CATAPRES - DOSED IN MG/24 HR) 0.1 mg/24hr patch Place 1 patch (0.1 mg total) onto the skin once a week. Patient taking differently: Place 0.1 mg onto the skin every Tuesday.  04/10/17   Nita Sells, MD  clopidogrel (PLAVIX) 75 MG tablet Place 1 tablet (75 mg total) into feeding tube daily. 01/11/17   Cherene Altes, MD  gabapentin (NEURONTIN) 100 MG capsule Take 100 mg See admin instructions by mouth. Open 1 capsule (100 mg) and administer contents via feeding tube three times daily    [provider]  glycopyrrolate (ROBINUL) 1 MG tablet Place 1 tablet (1 mg total) into feeding tube 2 (two) times daily. 11/22/17   Kayleen Memos, DO  ipratropium-albuterol (DUONEB) 0.5-2.5 (3) MG/3ML SOLN Take 3 mLs by nebulization every 4 (four) hours as needed. Patient taking differently: Take 3 mLs by nebulization every 4 (four) hours as needed (shortness of breath/wheezing).  01/10/17   Cherene Altes, MD  montelukast (SINGULAIR) 10 MG tablet Take 10 mg by mouth at bedtime.    [provider]  mouth rinse LIQD solution 10 mLs by Mouth Rinse route 4 (four) times daily.    [provider]  Nutritional Supplements (FEEDING SUPPLEMENT, JEVITY 1.2 CAL,) LIQD Place 1,000 mLs into feeding tube continuous. 11/22/17   Kayleen Memos, DO  pravastatin (PRAVACHOL) 20 MG tablet Place 1 tablet (20 mg total) into  feeding tube daily at 6 PM. Patient taking differently: Place 20 mg daily into feeding tube.  01/10/17   Cherene Altes, MD  ranitidine (ZANTAC) 150 MG/10ML syrup Place 10 mLs (150 mg total) into feeding tube daily. 11/23/17   Kayleen Memos, DO  thiamine 100 MG tablet Place 1 tablet (100 mg total) into feeding tube daily. 01/11/17   Cherene Altes, MD  Water For Irrigation, Sterile (FREE WATER) SOLN Place 100 mLs into feeding tube daily. Patient taking differently: Place 100 mLs into feeding tube every 3 (three) hours.  07/03/17   Lavina Hamman, MD    Family History Family History  Problem Relation Age of Onset  . Diabetes Mellitus II Brother   . CAD Neg Hx   . Stroke Neg Hx     Social History Social History  Tobacco Use  . Smoking status: Former Research scientist (life sciences)  . Smokeless tobacco: Never Used  . Tobacco comment: Smoked heavily until the day of his stroke  Substance Use Topics  . Alcohol use: Not Currently    Comment: Drank heavily prior to stroke per sister  . Drug use: Not Currently     Allergies   Patient has no known allergies.   Review of Systems Review of Systems  Unable to perform ROS: Patient nonverbal     Physical Exam Updated Vital Signs BP (!) 141/79   Pulse (!) 103   Temp 98.7 F (37.1 C) (Oral)   Resp 18   Wt 74.8 kg   SpO2 92%   BMI 22.38 kg/m   Physical Exam  Constitutional: He appears well-developed and well-nourished. No distress.  HENT:  Head: Normocephalic and atraumatic.  Right Ear: External ear normal.  Left Ear: External ear normal.  Nose: Nose normal.  Eyes: Right eye exhibits no discharge. Left eye exhibits no discharge.  Neck: Neck supple.  Trach in place. No drainage  Cardiovascular: Regular rhythm and normal heart sounds. Tachycardia present.  HR low 100s  Pulmonary/Chest: Effort normal.  Coarse breath sounds diffusely  Abdominal: Soft. He exhibits distension. There is no tenderness.  PEG tube in place. No gross blood in  tube  Musculoskeletal: He exhibits no edema.  Neurological: He is alert.  Skin: Skin is warm and dry. He is not diaphoretic.  Nursing note and vitals reviewed.    ED Treatments / Results  Labs (all labs ordered are listed, but only abnormal results are displayed) Labs Reviewed  COMPREHENSIVE METABOLIC PANEL - Abnormal; Notable for the following components:      Result Value   BUN 37 (*)    Total Protein 8.2 (*)    Albumin 3.2 (*)    All other components within normal limits  CBC WITH DIFFERENTIAL/PLATELET - Abnormal; Notable for the following components:   WBC 13.9 (*)    Neutro Abs 10.6 (*)    Monocytes Absolute 1.6 (*)    All other components within normal limits  CULTURE, BLOOD (ROUTINE X 2)  CULTURE, BLOOD (ROUTINE X 2)  PROTIME-INR  OCCULT BLOOD GASTRIC / DUODENUM (SPECIMEN CUP)  I-STAT CG4 LACTIC ACID, ED  I-STAT CG4 LACTIC ACID, ED  POC OCCULT BLOOD, ED  TYPE AND SCREEN    EKG EKG Interpretation  Date/Time:  Monday December 17 2017 19:45:24 EDT Ventricular Rate:  104 PR Interval:    QRS Duration: 90 QT Interval:  327 QTC Calculation: 431 R Axis:   95 Text Interpretation:  Sinus tachycardia Ventricular premature complex LAE, consider biatrial enlargement Probable right ventricular hypertrophy Poor data quality in current ECG precludes serial comparison Confirmed by Sherwood Gambler [phone]) on 12/17/2017 7:54:03 PM   Radiology Dg Chest Portable 1 View  Result Date: 12/17/2017 CLINICAL DATA:  Emesis. EXAM: PORTABLE CHEST 1 VIEW COMPARISON:  November 20, 2017 FINDINGS: That the tracheostomy tube is in good position. No pneumothorax. The left-sided infiltrate seen previously has resolved. Minimal atelectasis in the left base. There is mild opacity in the medial right lung base, slightly more prominent the interval. The cardiomediastinal silhouette is stable. No other interval changes. IMPRESSION: 1. Mild opacity in the medial right lung base is mildly more prominent  compared to the previous study. This could represent a combination of vascular crowding and atelectasis. A developing infiltrate is not excluded. Recommend clinical correlation and follow-up to resolution. Electronically Signed   By: Dorise Bullion  III M.D   On: 12/17/2017 20:03    Procedures Procedures (including critical care time)  Medications Ordered in ED Medications  Ampicillin-Sulbactam (UNASYN) 3 g in sodium chloride 0.9 % 100 mL IVPB (has no administration in time range)  ondansetron (ZOFRAN) injection 4 mg (4 mg Intravenous Given 12/17/17 1959)  Ampicillin-Sulbactam (UNASYN) 3 g in sodium chloride 0.9 % 100 mL IVPB (0 g Intravenous Stopped 12/17/17 2330)  iopamidol (ISOVUE-300) 61 % injection (  Contrast Given 12/17/17 2115)  iopamidol (ISOVUE-300) 61 % injection 100 mL (100 mLs Intravenous Contrast Given 12/17/17 2358)     Initial Impression / Assessment and Plan / ED Course  I have reviewed the triage vital signs and the nursing notes.  Pertinent labs & imaging results that were available during my care of the patient were reviewed by me and considered in my medical decision making (see chart for details).     Patient will definitely need to come to the hospital for hypoxic respiratory failure.  This is probably related aspiration as he is been having these vomiting episodes.  Thus he was given IV Unasyn.  He has required increased O2 but does not have any respiratory distress and is breathing on his own.  CT obtained given the vomiting and his history of prior surgery.  There is a large amount of stool seen on his CT and while the official report is not back, I went to do a rectal exam.  He had a moderate amount of soft stool prior to me during the exam but even during the exam he had no formed stool ball or fecal impaction.  He may need bowel care but does not appear to need manual disimpaction. Care to Dr. Kathrynn Humble with CT report and admission pending.  Final Clinical Impressions(s)  / ED Diagnoses   Final diagnoses:  None    ED Discharge Orders    None       Sherwood Gambler, MD 12/18/17 [phone]

## 2017-12-17 NOTE — ED Triage Notes (Signed)
Pt transported from home by EMS c/o emesis x 1 at home, dark emesis. #20 IV L hand.  Pt is nonverbal, old CVA deficits, family reports pt at baseline.  Pt was suctioned at home, initial sat 81% RA at home, pt does have trach. NRB over trach on arrival. Pt is alert

## 2017-12-17 NOTE — ED Notes (Signed)
D/c information entered in error by myself

## 2017-12-18 ENCOUNTER — Inpatient Hospital Stay (HOSPITAL_COMMUNITY): Payer: Medicare HMO

## 2017-12-18 ENCOUNTER — Emergency Department (HOSPITAL_COMMUNITY): Payer: Medicare HMO

## 2017-12-18 DIAGNOSIS — J9622 Acute and chronic respiratory failure with hypercapnia: Secondary | ICD-10-CM | POA: Diagnosis present

## 2017-12-18 DIAGNOSIS — R112 Nausea with vomiting, unspecified: Secondary | ICD-10-CM | POA: Diagnosis not present

## 2017-12-18 DIAGNOSIS — Z931 Gastrostomy status: Secondary | ICD-10-CM | POA: Diagnosis not present

## 2017-12-18 DIAGNOSIS — Z7902 Long term (current) use of antithrombotics/antiplatelets: Secondary | ICD-10-CM | POA: Diagnosis not present

## 2017-12-18 DIAGNOSIS — N2881 Hypertrophy of kidney: Secondary | ICD-10-CM | POA: Diagnosis present

## 2017-12-18 DIAGNOSIS — E440 Moderate protein-calorie malnutrition: Secondary | ICD-10-CM | POA: Diagnosis present

## 2017-12-18 DIAGNOSIS — J810 Acute pulmonary edema: Secondary | ICD-10-CM | POA: Diagnosis not present

## 2017-12-18 DIAGNOSIS — B449 Aspergillosis, unspecified: Secondary | ICD-10-CM

## 2017-12-18 DIAGNOSIS — J690 Pneumonitis due to inhalation of food and vomit: Secondary | ICD-10-CM

## 2017-12-18 DIAGNOSIS — E785 Hyperlipidemia, unspecified: Secondary | ICD-10-CM | POA: Diagnosis present

## 2017-12-18 DIAGNOSIS — Z86010 Personal history of colonic polyps: Secondary | ICD-10-CM | POA: Diagnosis not present

## 2017-12-18 DIAGNOSIS — Z9889 Other specified postprocedural states: Secondary | ICD-10-CM

## 2017-12-18 DIAGNOSIS — R0902 Hypoxemia: Secondary | ICD-10-CM

## 2017-12-18 DIAGNOSIS — A419 Sepsis, unspecified organism: Principal | ICD-10-CM

## 2017-12-18 DIAGNOSIS — I10 Essential (primary) hypertension: Secondary | ICD-10-CM | POA: Diagnosis not present

## 2017-12-18 DIAGNOSIS — J9601 Acute respiratory failure with hypoxia: Secondary | ICD-10-CM

## 2017-12-18 DIAGNOSIS — R1310 Dysphagia, unspecified: Secondary | ICD-10-CM | POA: Diagnosis present

## 2017-12-18 DIAGNOSIS — D631 Anemia in chronic kidney disease: Secondary | ICD-10-CM | POA: Diagnosis present

## 2017-12-18 DIAGNOSIS — E872 Acidosis: Secondary | ICD-10-CM | POA: Diagnosis present

## 2017-12-18 DIAGNOSIS — Z7982 Long term (current) use of aspirin: Secondary | ICD-10-CM | POA: Diagnosis not present

## 2017-12-18 DIAGNOSIS — J9602 Acute respiratory failure with hypercapnia: Secondary | ICD-10-CM | POA: Diagnosis not present

## 2017-12-18 DIAGNOSIS — I639 Cerebral infarction, unspecified: Secondary | ICD-10-CM

## 2017-12-18 DIAGNOSIS — T17910A Gastric contents in respiratory tract, part unspecified causing asphyxiation, initial encounter: Secondary | ICD-10-CM

## 2017-12-18 DIAGNOSIS — K219 Gastro-esophageal reflux disease without esophagitis: Secondary | ICD-10-CM | POA: Diagnosis present

## 2017-12-18 DIAGNOSIS — R1110 Vomiting, unspecified: Secondary | ICD-10-CM | POA: Diagnosis present

## 2017-12-18 DIAGNOSIS — N183 Chronic kidney disease, stage 3 (moderate): Secondary | ICD-10-CM | POA: Diagnosis present

## 2017-12-18 DIAGNOSIS — X58XXXA Exposure to other specified factors, initial encounter: Secondary | ICD-10-CM | POA: Diagnosis not present

## 2017-12-18 DIAGNOSIS — Z79899 Other long term (current) drug therapy: Secondary | ICD-10-CM | POA: Diagnosis not present

## 2017-12-18 DIAGNOSIS — T17918A Gastric contents in respiratory tract, part unspecified causing other injury, initial encounter: Secondary | ICD-10-CM | POA: Diagnosis present

## 2017-12-18 DIAGNOSIS — T17990A Other foreign object in respiratory tract, part unspecified in causing asphyxiation, initial encounter: Secondary | ICD-10-CM | POA: Diagnosis present

## 2017-12-18 DIAGNOSIS — I129 Hypertensive chronic kidney disease with stage 1 through stage 4 chronic kidney disease, or unspecified chronic kidney disease: Secondary | ICD-10-CM | POA: Diagnosis present

## 2017-12-18 DIAGNOSIS — K5641 Fecal impaction: Secondary | ICD-10-CM | POA: Diagnosis present

## 2017-12-18 DIAGNOSIS — Z515 Encounter for palliative care: Secondary | ICD-10-CM | POA: Diagnosis present

## 2017-12-18 DIAGNOSIS — J9621 Acute and chronic respiratory failure with hypoxia: Secondary | ICD-10-CM | POA: Diagnosis present

## 2017-12-18 DIAGNOSIS — I700 Atherosclerosis of aorta: Secondary | ICD-10-CM | POA: Diagnosis present

## 2017-12-18 LAB — CBC
HCT: 55.6 % — ABNORMAL HIGH (ref 39.0–52.0)
Hemoglobin: 17.1 g/dL — ABNORMAL HIGH (ref 13.0–17.0)
MCH: 29.8 pg (ref 26.0–34.0)
MCHC: 30.8 g/dL (ref 30.0–36.0)
MCV: 97 fL (ref 78.0–100.0)
Platelets: 281 10*3/uL (ref 150–400)
RBC: 5.73 MIL/uL (ref 4.22–5.81)
RDW: 15.4 % (ref 11.5–15.5)
WBC: 12.1 10*3/uL — ABNORMAL HIGH (ref 4.0–10.5)

## 2017-12-18 LAB — VITAMIN B12: Vitamin B-12: 2014 pg/mL — ABNORMAL HIGH (ref 180–914)

## 2017-12-18 LAB — BASIC METABOLIC PANEL
Anion gap: 11 (ref 5–15)
BUN: 36 mg/dL — ABNORMAL HIGH (ref 8–23)
CO2: 33 mmol/L — ABNORMAL HIGH (ref 22–32)
Calcium: 10.2 mg/dL (ref 8.9–10.3)
Chloride: 98 mmol/L (ref 98–111)
Creatinine, Ser: 1.25 mg/dL — ABNORMAL HIGH (ref 0.61–1.24)
GFR calc Af Amer: >60 mL/min (ref >60)
GFR calc non Af Amer: 58 mL/min — ABNORMAL LOW (ref >60)
Glucose, Bld: 83 mg/dL (ref 70–99)
Potassium: 4.7 mmol/L (ref 3.5–5.1)
Sodium: 142 mmol/L (ref 135–145)

## 2017-12-18 LAB — BLOOD GAS, ARTERIAL
Acid-Base Excess: 6.8 mmol/L — ABNORMAL HIGH (ref 0.0–2.0)
Acid-Base Excess: 7.1 mmol/L — ABNORMAL HIGH (ref 0.0–2.0)
Acid-Base Excess: 7.5 mmol/L — ABNORMAL HIGH (ref 0.0–2.0)
Bicarbonate: 32.4 mmol/L — ABNORMAL HIGH (ref 20.0–28.0)
Bicarbonate: 33 mmol/L — ABNORMAL HIGH (ref 20.0–28.0)
Bicarbonate: 33.3 mmol/L — ABNORMAL HIGH (ref 20.0–28.0)
Drawn by: 313941
Drawn by: 313941
FIO2: 98
FIO2: 60
FIO2: 0.4
O2 Saturation: 89.3 %
O2 Saturation: 97.7 %
O2 Saturation: 91.1 %
Patient temperature: 98.6
Patient temperature: 98.6
Patient temperature: 98.8
pCO2 arterial: 61.9 mmHg — ABNORMAL HIGH (ref 32.0–48.0)
pCO2 arterial: 65.7 mmHg (ref 32.0–48.0)
pCO2 arterial: 65.2 mmHg (ref 32.0–48.0)
pH, Arterial: 7.339 — ABNORMAL LOW (ref 7.350–7.450)
pH, Arterial: 7.321 — ABNORMAL LOW (ref 7.350–7.450)
pH, Arterial: 7.329 — ABNORMAL LOW (ref 7.350–7.450)
pO2, Arterial: 60.3 mmHg — ABNORMAL LOW (ref 83.0–108.0)
pO2, Arterial: 112 mmHg — ABNORMAL HIGH (ref 83.0–108.0)
pO2, Arterial: 67 mmHg — ABNORMAL LOW (ref 83.0–108.0)

## 2017-12-18 LAB — HIV ANTIBODY (ROUTINE TESTING W REFLEX): HIV Screen 4th Generation wRfx: NONREACTIVE

## 2017-12-18 LAB — URINALYSIS, ROUTINE W REFLEX MICROSCOPIC
Bilirubin Urine: NEGATIVE
Glucose, UA: NEGATIVE mg/dL
Ketones, ur: NEGATIVE mg/dL
Nitrite: NEGATIVE
Protein, ur: 30 mg/dL — AB
Specific Gravity, Urine: 1.025 (ref 1.005–1.030)
pH: 7 (ref 5.0–8.0)

## 2017-12-18 LAB — LACTIC ACID, PLASMA: Lactic Acid, Venous: 0.7 mmol/L (ref 0.5–1.9)

## 2017-12-18 LAB — MRSA PCR SCREENING: MRSA by PCR: NEGATIVE

## 2017-12-18 LAB — AMMONIA: Ammonia: 26 umol/L (ref 9–35)

## 2017-12-18 LAB — APTT: aPTT: 38 seconds — ABNORMAL HIGH (ref 24–36)

## 2017-12-18 LAB — PROCALCITONIN: Procalcitonin: 0.3 ng/mL

## 2017-12-18 LAB — FOLATE: Folate: 52.8 ng/mL (ref >5.9)

## 2017-12-18 LAB — OCCULT BLOOD GASTRIC / DUODENUM (SPECIMEN CUP): Occult Blood, Gastric: NEGATIVE

## 2017-12-18 IMAGING — DX DG ABD PORTABLE 1V
2 series · 2 of 2 positions shown · non-contrast
Comparison: 11/06/2016

CLINICAL DATA: Vomiting, ileus

EXAM:
PORTABLE ABDOMEN - 1 VIEW

[abdomen kub (1 of 2)]
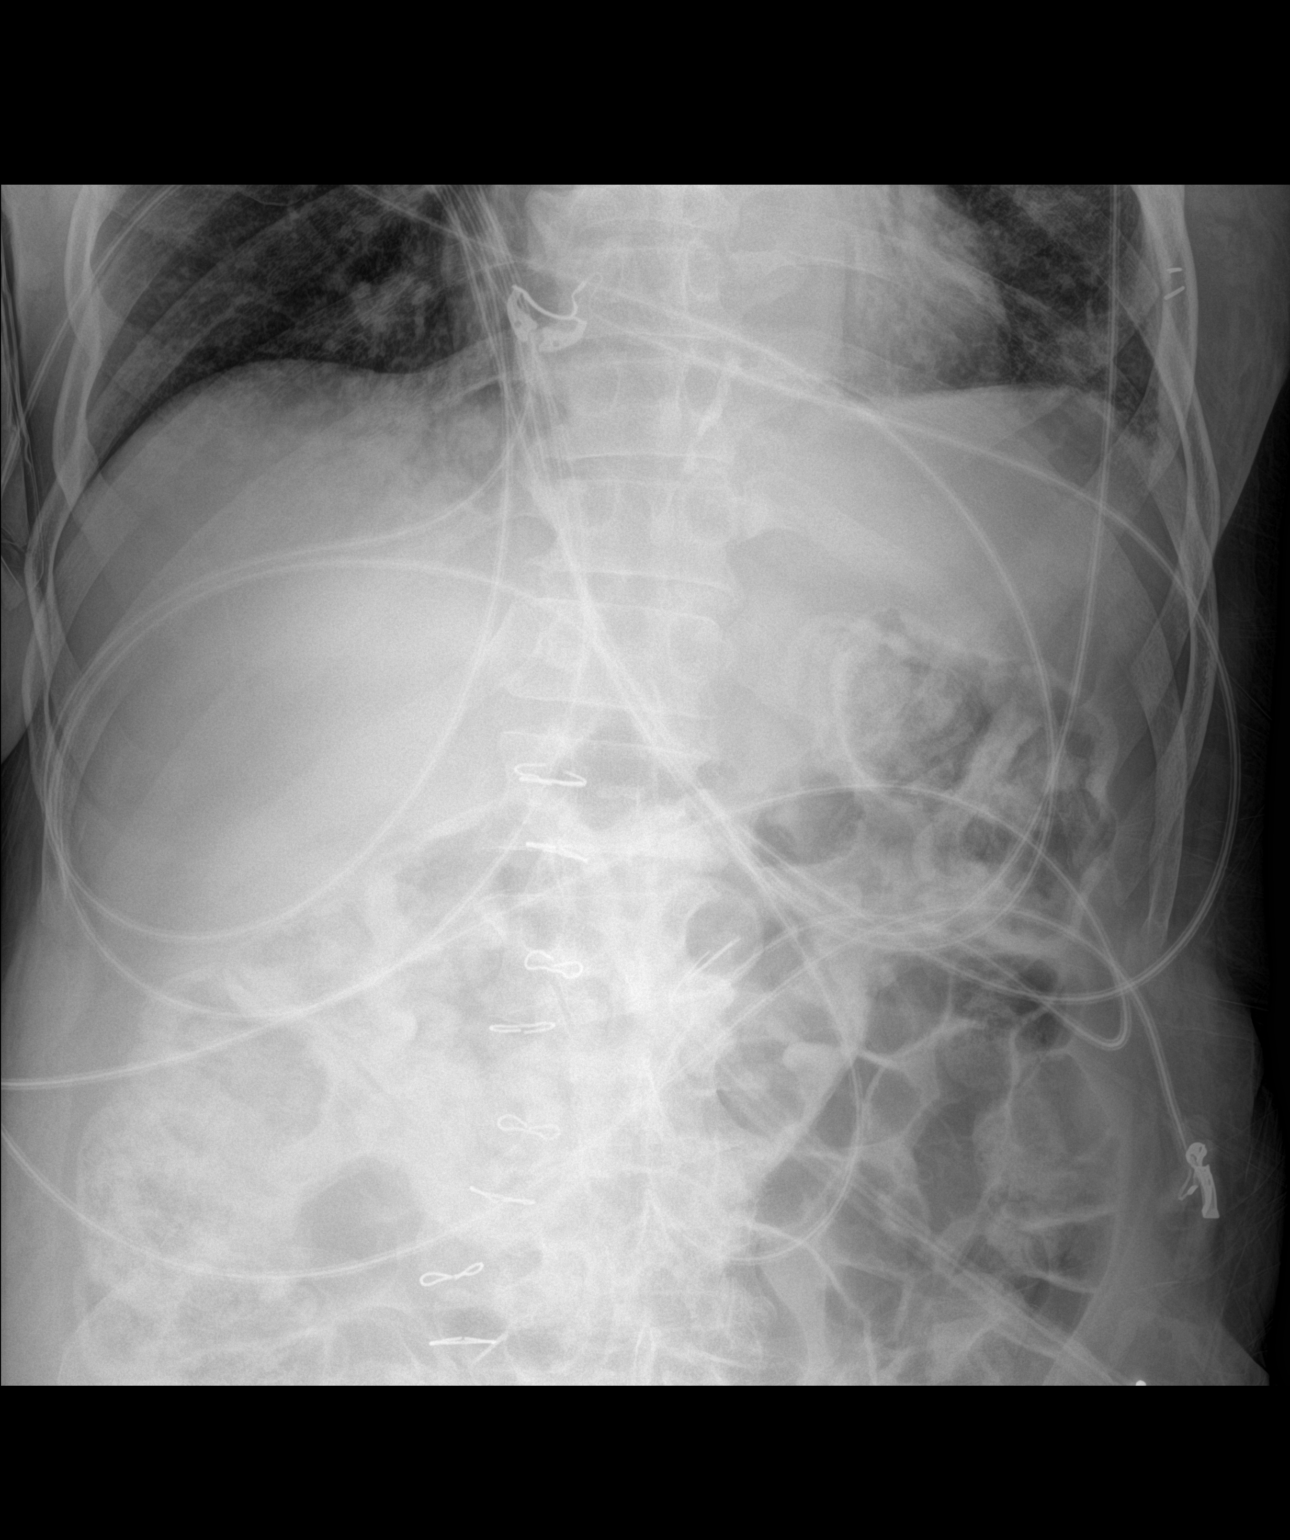

[abdomen kub (2 of 2)]
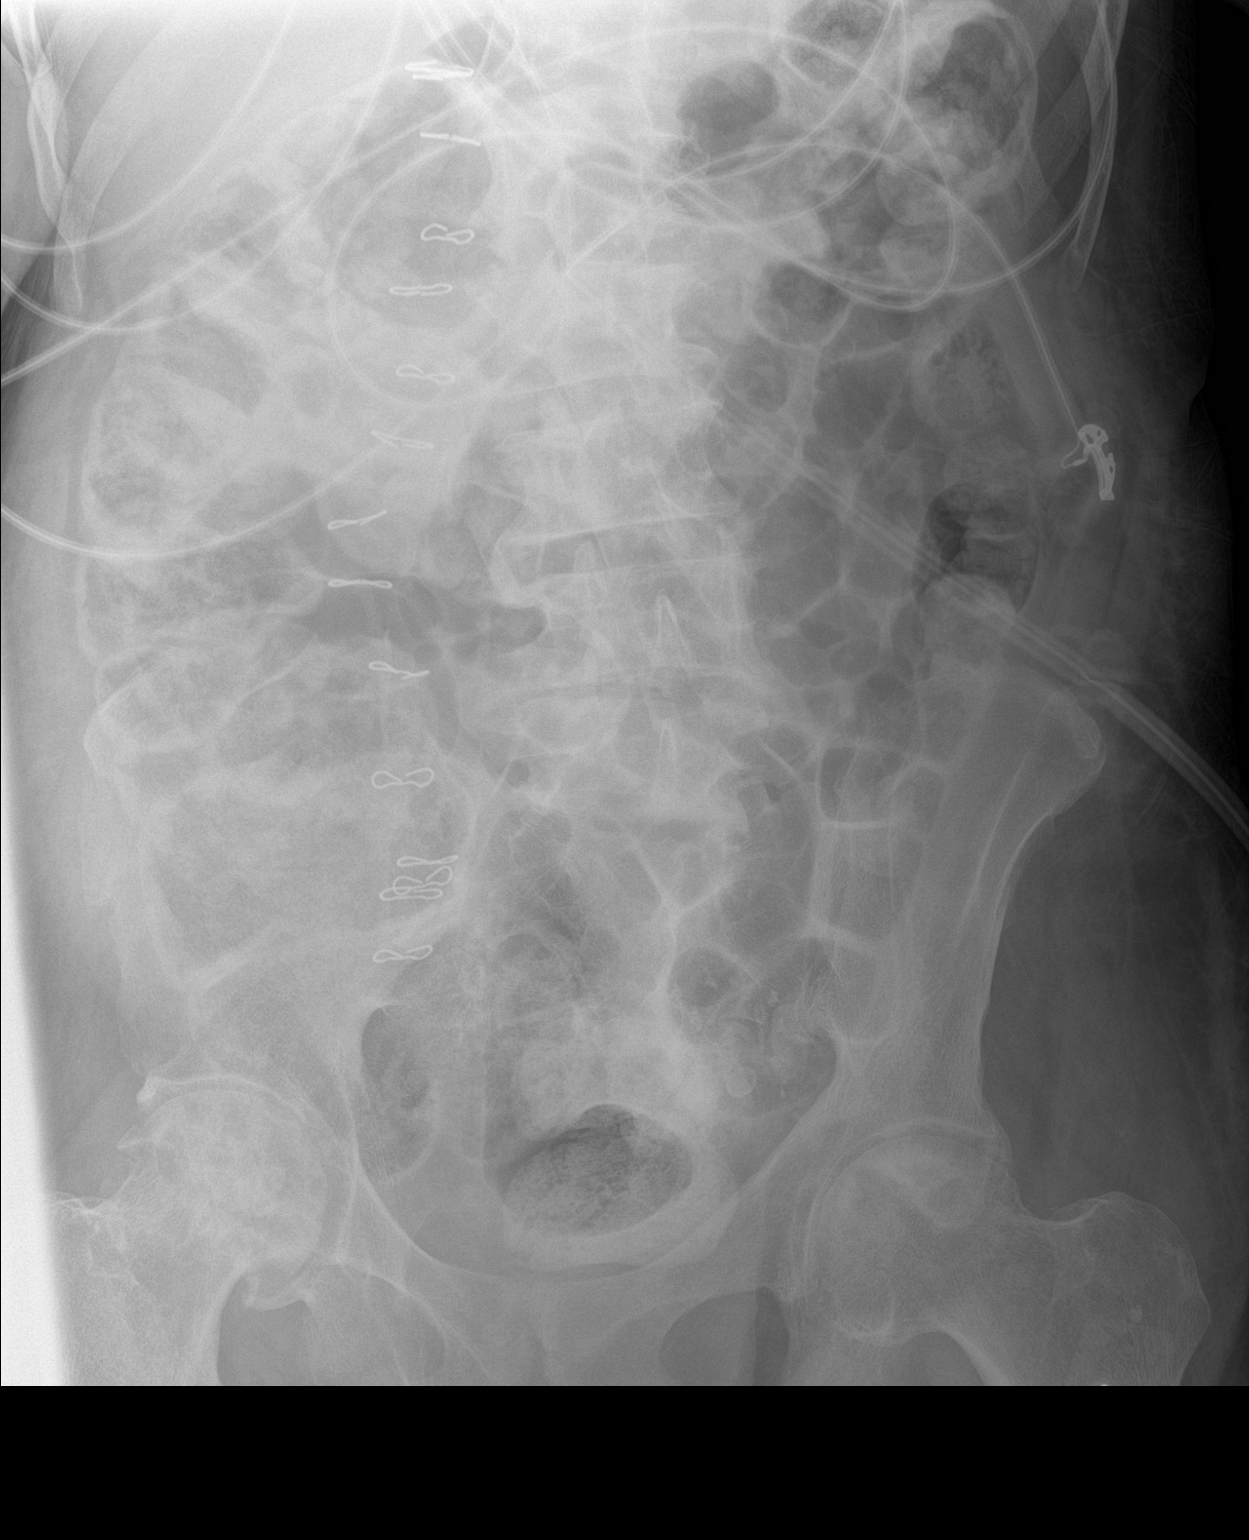

[2 of 2 positions shown; findings below may reference images not displayed]

FINDINGS: Gastrostomy tube projects over the stomach. Gas throughout mildly
prominent large and small bowel loops, similar to prior study.
Moderate stool burden. No visible free air organomegaly.
IMPRESSION: Stable gas throughout mildly prominent large and small bowel loops.
No change.

## 2017-12-18 MED ORDER — IPRATROPIUM BROMIDE 0.02 % IN SOLN
0.5000 mg | Freq: Four times a day (QID) | RESPIRATORY_TRACT | Status: DC
  Administered 2017-12-18 – 2017-12-19 (×5): 0.5 mg via RESPIRATORY_TRACT
  Filled 2017-12-18 (×6): qty 2.5

## 2017-12-18 MED ORDER — LEVALBUTEROL HCL 1.25 MG/0.5ML IN NEBU
1.2500 mg | INHALATION_SOLUTION | Freq: Four times a day (QID) | RESPIRATORY_TRACT | Status: DC
  Administered 2017-12-18 – 2017-12-19 (×7): 1.25 mg via RESPIRATORY_TRACT
  Filled 2017-12-18 (×8): qty 0.5

## 2017-12-18 MED ORDER — ATROPINE SULFATE 1 % OP SOLN
2.0000 [drp] | Freq: Three times a day (TID) | OPHTHALMIC | Status: DC | PRN
  Filled 2017-12-18: qty 2

## 2017-12-18 MED ORDER — SORBITOL 70 % SOLN
30.0000 mL | Freq: Every day | Status: DC
  Administered 2017-12-18 – 2017-12-21 (×4): 30 mL
  Filled 2017-12-18 (×4): qty 30

## 2017-12-18 MED ORDER — PANTOPRAZOLE SODIUM 40 MG IV SOLR
40.0000 mg | Freq: Two times a day (BID) | INTRAVENOUS | Status: DC
  Administered 2017-12-18 – 2017-12-21 (×8): 40 mg via INTRAVENOUS
  Filled 2017-12-18 (×8): qty 40

## 2017-12-18 MED ORDER — IPRATROPIUM BROMIDE 0.02 % IN SOLN
0.5000 mg | RESPIRATORY_TRACT | Status: DC
  Administered 2017-12-18 (×2): 0.5 mg via RESPIRATORY_TRACT
  Filled 2017-12-18 (×2): qty 2.5

## 2017-12-18 MED ORDER — VANCOMYCIN HCL 10 G IV SOLR
1250.0000 mg | Freq: Once | INTRAVENOUS | Status: AC
  Administered 2017-12-18: 1250 mg via INTRAVENOUS
  Filled 2017-12-18: qty 1250

## 2017-12-18 MED ORDER — SODIUM CHLORIDE 0.9 % IV BOLUS: 1000.0000 mL | Freq: Once | INTRAVENOUS | Status: DC

## 2017-12-18 MED ORDER — GUAIFENESIN-DM 100-10 MG/5ML PO SYRP
5.0000 mL | ORAL_SOLUTION | ORAL | Status: DC | PRN
  Administered 2017-12-18 – 2017-12-21 (×6): 5 mL
  Filled 2017-12-18 (×6): qty 5

## 2017-12-18 MED ORDER — FLEET ENEMA 7-19 GM/118ML RE ENEM
1.0000 | ENEMA | Freq: Once | RECTAL | Status: AC
  Administered 2017-12-18: 1 via RECTAL
  Filled 2017-12-18: qty 1

## 2017-12-18 MED ORDER — SODIUM CHLORIDE 0.9 % IV BOLUS
1000.0000 mL | Freq: Once | INTRAVENOUS | Status: AC
  Administered 2017-12-18: 1000 mL via INTRAVENOUS

## 2017-12-18 MED ORDER — ACETAMINOPHEN 325 MG PO TABS: 650.0000 mg | ORAL_TABLET | Freq: Four times a day (QID) | ORAL | Status: DC | PRN

## 2017-12-18 MED ORDER — CLONIDINE HCL 0.1 MG/24HR TD PTWK
0.1000 mg | MEDICATED_PATCH | TRANSDERMAL | Status: DC
  Administered 2017-12-18: 0.1 mg via TRANSDERMAL
  Filled 2017-12-18: qty 1

## 2017-12-18 MED ORDER — VANCOMYCIN HCL 500 MG IV SOLR
500.0000 mg | Freq: Two times a day (BID) | INTRAVENOUS | Status: DC
  Filled 2017-12-18: qty 500

## 2017-12-18 MED ORDER — ONDANSETRON HCL 4 MG/2ML IJ SOLN
4.0000 mg | Freq: Three times a day (TID) | INTRAMUSCULAR | Status: DC | PRN
  Administered 2017-12-18 – 2017-12-19 (×4): 4 mg via INTRAVENOUS
  Filled 2017-12-18 (×4): qty 2

## 2017-12-18 MED ORDER — SODIUM CHLORIDE 0.9 % IV SOLN
INTRAVENOUS | Status: DC
  Administered 2017-12-18: 04:00:00 via INTRAVENOUS

## 2017-12-18 MED ORDER — ACETAMINOPHEN 650 MG RE SUPP: 650.0000 mg | Freq: Four times a day (QID) | RECTAL | Status: DC | PRN

## 2017-12-18 MED ORDER — HYDRALAZINE HCL 20 MG/ML IJ SOLN: 5.0000 mg | INTRAMUSCULAR | Status: DC | PRN

## 2017-12-18 MED ORDER — ACETAMINOPHEN 160 MG/5ML PO SOLN
650.0000 mg | Freq: Four times a day (QID) | ORAL | Status: DC | PRN
  Administered 2017-12-18 – 2017-12-21 (×6): 650 mg
  Filled 2017-12-18 (×6): qty 20.3

## 2017-12-18 MED ORDER — BISACODYL 10 MG RE SUPP: 10.0000 mg | Freq: Every day | RECTAL | Status: DC | PRN

## 2017-12-18 MED ORDER — POLYETHYLENE GLYCOL 3350 17 G PO PACK
17.0000 g | PACK | Freq: Two times a day (BID) | ORAL | Status: DC
  Administered 2017-12-18 – 2017-12-19 (×3): 17 g
  Filled 2017-12-18 (×3): qty 1

## 2017-12-18 MED ORDER — PROMETHAZINE HCL 25 MG/ML IJ SOLN
12.5000 mg | Freq: Once | INTRAMUSCULAR | Status: AC
  Administered 2017-12-18: 12.5 mg via INTRAVENOUS
  Filled 2017-12-18: qty 1

## 2017-12-18 MED ORDER — DEXTROSE-NACL 5-0.9 % IV SOLN
INTRAVENOUS | Status: DC
  Administered 2017-12-18 – 2017-12-19 (×2): via INTRAVENOUS

## 2017-12-18 NOTE — Progress Notes (Signed)
Initial Nutrition Assessment  DOCUMENTATION CODES:   Non-severe (moderate) malnutrition in context of chronic illness  INTERVENTION:    Once medically appropriate:  Initiate Jevity 1.2 formula at 20 ml/hr and increase by 10 ml every 4 hours to goal rate of 60 ml/hr  Provides 1728 kcals, 80 gm protein, 1162 ml of free water  NUTRITION DIAGNOSIS:   Moderate Malnutrition related to chronic illness(chronic trach, s/p CVA, CKD) as evidenced by NPO status(TF dependency via PEG tube)  GOAL:   Patient will meet greater than or equal to 90% of their needs  MONITOR:   TF tolerance, Labs, Skin, Weight trends, I & O's  REASON FOR ASSESSMENT:   Malnutrition Screening Tool  ASSESSMENT:   67 y.o. Male with a hx of HTN, HLD, stroke, CKD 3, GERD, and aspiration pneumonia who presented with nausea and vomiting. He vomited dark-colored material negative on hemaoccult test in ED. Initial O2 sat was 81% on RA at home.   Chest x-ray 9/2 revealed mild opacity in the R middle lobe.  CT abd/pelvis 9/3 showed significant fecal impaction in colon.  RD unable to speak with pt. He is nonverbal. Lurline Idol dependent. Pt takes nothing by mouth. Receives 100% of his nutrition via PEG tube. Home TF regimen is Jevity 1.2 formula at goal rate of 60 ml/hr.  Per readings below, pt's weight has trended up since June 2019. Medications include ABX, phenergan, miralax and protonix.  Labs reviewed. BUN 36 (H). Cr 1.25 (H).  NUTRITION - FOCUSED PHYSICAL EXAM:    Most Recent Value  Orbital Region  Mild depletion  Upper Arm Region  Moderate depletion  Thoracic and Lumbar Region  Unable to assess  Buccal Region  Mild depletion  Temple Region  Moderate depletion  Clavicle Bone Region  Moderate depletion  Clavicle and Acromion Bone Region  Moderate depletion  Scapular Bone Region  Unable to assess  Dorsal Hand  Unable to assess  Patellar Region  Moderate depletion  Anterior Thigh Region  Moderate depletion   Posterior Calf Region  Moderate depletion  Edema (RD Assessment)  None     Diet Order:   Diet Order            Diet NPO time specified  Diet effective now             EDUCATION NEEDS:   No education needs have been identified at this time  Skin:  Skin Assessment: Reviewed RN Assessment  Last BM:  PTA  Height:   Ht Readings from Last 1 Encounters:  11/18/17 6' (1.829 m)   Weight:   Wt Readings from Last 1 Encounters:  12/17/17 74.8 kg   Wt Readings from Last 10 Encounters:  12/17/17 74.8 kg  11/22/17 71.7 kg  09/30/17 68 kg  08/17/17 65.8 kg  07/16/17 65.8 kg  07/03/17 65.8 kg  04/04/17 64.2 kg  03/03/17 62.8 kg  02/24/17 63.5 kg  02/17/17 63.5 kg   BMI:  Body mass index is 22.38 kg/m.  Estimated Nutritional Needs:   Kcal:  1650-1850  Protein:  80-95 gm  Fluid:  1.6-1.8 L  Arthur Holms, RD, LDN Pager #: [phone] After-Hours Pager #: [phone]

## 2017-12-18 NOTE — Progress Notes (Signed)
Pharmacy Antibiotic Note  NAJEH CREDIT is a 67 y.o. male for trach dependent respiratory failure admitted on 12/17/2017 with N/V and aspiration pneumonitis .  Pharmacy has been consulted for Vancomycin dosing.  Plan: Vancomycin 1250 mg IV now, then 500 mg IV q12h  Weight: 165 lb (74.8 kg)  Temp (24hrs), Avg:98.7 F (37.1 C), Min:98.7 F (37.1 C), Max:98.7 F (37.1 C)  Recent Labs  Lab 12/17/17 1949 12/17/17 1952 12/17/17 2012 12/17/17 2145 12/18/17 0245  WBC  --  13.9*  --   --   --   CREATININE 1.03  --   --   --   --   LATICACIDVEN  --   --  0.99 0.86 0.7    Estimated Creatinine Clearance: 73.6 mL/min (by C-G formula based on SCr of 1.03 mg/dL).    No Known Allergies   Caryl Pina 12/18/2017 6:42 AM

## 2017-12-18 NOTE — Consult Note (Addendum)
Scott Rivera  WYS:[id number] DOB: 11-Aug-1950 DOA: 12/17/2017 PCP: Jamesetta Orleans, PA-C    LOS: 0 days   Reason for Consult / Chief Complaint:  Hypoxia  HPI/summary of hospital stay:  HPI obtained from medical chart review as patient is nonverbal at baseline.  67 year old male with PMH CVA s/p tracheostomy and PEG with chronic dysphagia, HTN, HLD, GERD, and CKD stage 3 presenting from home with nausea and vomiting that started yesterday.  Of note, patient admitted 8/4-8/8 treated for aspiration pneumonia secondary to nausea and vomiting due to constipation.  Sputum culture at that time positive for pseudomonas aeruginosa, acinetobacter, stenotrophomonas and MDRO.  He was treated with 7 days of Unasyn and then Augmentin for 5 days.   O2 saturations at home reported at 81% on RA prior to suctioning.  In the ER, patient had several episodes of dark emesis with intermittent desaturations.  He was suctioned with a thick mucous plug once.  Labs noted for WBC 13.9, normal lactate, PCT 0.3, CXR with developing RLL infiltrate and CT abd/pelvis concerning for esophagitis and noted for fecal impaction with significant stool burden. He was placed on unasyn and admitted to stepdown by [facility].  On stepdown, he was given phenergan and NGT placed with resolution of vomiting but was requiring 90-100% O2 to maintain saturations.  300 ml of dark coffee ground emesis noted thus far since placement.  PCCM consulted given hypoxia and concern for need of mechanical ventilation.    Subjective  Per RN, no vomiting and saturations have held since placement NGT placement.  Assessment & Plan:  Acute on chronic hypoxic respiratory failure with tracheostomy and chronic aspiration  Aspiration pneumonitis - RLL - prior 8/4 trach cx w/pseudomonas aeruginosa, acinetobacter, stenotrophomonas and MDRO - ABG showing compensated resp acidosis - currently on 60% trach collar and holding saturations at 98% with normal WOB P:    Wean supplemental O2, wean for sats > 92% Ok to leave in SDU for now, PCCM will continue to closely follow May require MV assist, will repeat ABG at 6am Cuffed shiley to bedside  Aggressive pulm hygiene Suction PRN Xopenex/  Follow sputum cx Continue Unasyn, add vanc for now Trend PCT, WBC, fever curve  Consider PMT consult given ongoing chronic aspiration with MDROs   Nausea, Vomiting, and constipation - prior admit 8/2 for same secondary to constipation  - CT abd/pelvis concerning for esophagitis and noted fecal impaction with significant stool burden s/p attempt at disimpaction in ER - s/p NGT P:  Per primary team NGT to suction, send for gastro-occult Protonix BID Enema and miralax ordered    Remainder per primary team   Best Practice / Goals of Care / Disposition.   DVT prophylaxis: SCDs GI prophylaxis: protonix  Diet: NPO Mobility: bedrest Code Status: Full  Family Communication: no family at bedside.   Disposition / Summary of Today's Plan 12/18/17   Pending repeat ABG.  Wean supplemental O2 as able.   Consultants: date of consult/date signed off (if applicable)/final recs   Procedures:  Significant Diagnostic Tests: 9/2 CT abd/ pelvis >> 1. Significant stool burden throughout the colon with fecal impaction as above. 2. Thick-walled distal esophagus query esophagitis.   3. Gastrostomy tube within the distal stomach. Foley decompressed urinary bladder. 4. Aortic atherosclerosis. 5. Compensatory hypertrophy of the right kidney with atrophic left kidney as before.  Micro Data: 9/2 BCx 2 >> 9/3 trach asp >>  Antimicrobials:  9/2 Unasyn >> 9/3 vanc >>  Objective    Examination: General:  Chronically ill appearing elderly male sitting up in bed in NAD HEENT: MM pink/moist, R nare NG with dark coffee ground emesis, pupils 3/reactive, anicteric, 4 cuffless shiley trach in place  Neuro: Awake, nonverbal (at baseline), does not f/c, spont moves BUE CV: RR  IR, distant heart sounds PULM: Shallow/non-labored, lungs bilaterally rhonchi, poor airmovement GI: distended, non-tender, hypoBS, peg tube to gravity Extremities: warm/dry, no peripheral edema, bilateral swelling to both knees L>R- no warmth/erythema Skin: no rashes  Blood pressure (!) 103/54, pulse (!) 102, temperature 98.7 F (37.1 C), temperature source Oral, resp. rate 16, weight 74.8 kg, SpO2 (!) 88 %.    FiO2 (%):  [28 %-98 %] 98 %   Intake/Output Summary (Last 24 hours) at 12/18/2017 0531 Last data filed at 12/18/2017 0302 Gross per 24 hour  Intake -  Output 500 ml  Net -500 ml   Filed Weights   12/17/17 1944  Weight: 74.8 kg     Labs    CBC: Recent Labs  Lab 12/17/17 1952  WBC 13.9*  NEUTROABS 10.6*  HGB 15.5  HCT 49.9  MCV 96.0  PLT 621   Basic Metabolic Panel: Recent Labs  Lab 12/17/17 1949  NA 138  K 4.7  CL 99  CO2 31  GLUCOSE 88  BUN 37*  CREATININE 1.03  CALCIUM 10.0   GFR: Estimated Creatinine Clearance: 73.6 mL/min (by C-G formula based on SCr of 1.03 mg/dL). Recent Labs  Lab 12/17/17 1952 12/17/17 2012 12/17/17 2145 12/18/17 0245  PROCALCITON  --   --   --  0.30  WBC 13.9*  --   --   --   LATICACIDVEN  --  0.99 0.86 0.7   Liver Function Tests: Recent Labs  Lab 12/17/17 1949  AST 21  ALT 16  ALKPHOS 101  BILITOT 0.8  PROT 8.2*  ALBUMIN 3.2*   No results for input(s): LIPASE, AMYLASE in the last 168 hours. No results for input(s): AMMONIA in the last 168 hours. ABG    Component Value Date/Time   PHART 7.339 (L) 12/18/2017 0436   PCO2ART 61.9 (H) 12/18/2017 0436   PO2ART 60.3 (L) 12/18/2017 0436   HCO3 32.4 (H) 12/18/2017 0436   TCO2 27 03/02/2017 1939   ACIDBASEDEF 1.5 12/11/2016 0410   O2SAT 89.3 12/18/2017 0436    Coagulation Profile: Recent Labs  Lab 12/17/17 1952  INR 1.03   Cardiac Enzymes: No results for input(s): CKTOTAL, CKMB, CKMBINDEX, TROPONINI in the last 168 hours. HbA1C: Hgb A1c MFr Bld    Date/Time Value Ref Range Status  12/09/2016 02:19 PM 5.7 (H) 4.8 - 5.6 % Final    Comment:    (NOTE) Pre diabetes:          5.7%-6.4% Diabetes:              >6.4% Glycemic control for   <7.0% adults with diabetes   10/21/2016 02:53 AM 5.4 4.8 - 5.6 % Final    Comment:    (NOTE)         Pre-diabetes: 5.7 - 6.4         Diabetes: >6.4         Glycemic control for adults with diabetes: <7.0    CBG: No results for input(s): GLUCAP in the last 168 hours.   Review of Systems:   Unable to complete as patient is nonverbal.  Past medical history  He,  has a past medical history of Anemia due  to chronic kidney disease, Aspiration pneumonia ([place]), Chronic kidney disease, stage 3 (moderate) ([place]), Hypertension, and Stroke ([place]).   Surgical History    Past Surgical History:  Procedure Laterality Date  . IR GASTROSTOMY TUBE MOD SED  10/27/2016  . IR PATIENT EVAL TECH 0-60 MINS  01/05/2017  . IR PATIENT EVAL TECH 0-60 MINS  01/09/2017  . IR PATIENT EVAL TECH 0-60 MINS  03/03/2017  . IR PATIENT EVAL TECH 0-60 MINS  04/03/2017  . IR REPLACE G-TUBE SIMPLE WO FLUORO  11/20/2017  . IR [place] TUBE PERCUT W/FLUORO  07/23/2017  . KNEE SURGERY    . TRACHEOSTOMY TUBE PLACEMENT       Social History   Social History   Socioeconomic History  . Marital status: Single    Spouse name: Not on file  . Number of children: Not on file  . Years of education: Not on file  . Highest education level: Not on file  Occupational History  . Not on file  Social Needs  . Financial resource strain: Not hard at all  . Food insecurity:    Worry: Not on file    Inability: Never true  . Transportation needs:    Medical: No    Non-medical: No  Tobacco Use  . Smoking status: Former Research scientist (life sciences)  . Smokeless tobacco: Never Used  . Tobacco comment: Smoked heavily until the day of his stroke  Substance and Sexual Activity  . Alcohol use: Not Currently    Comment: Drank heavily prior to stroke per  sister  . Drug use: Not Currently  . Sexual activity: Not Currently  Lifestyle  . Physical activity:    Days per week: Not on file    Minutes per session: Not on file  . Stress: Not on file  Relationships  . Social connections:    Talks on phone: Not on file    Gets together: Not on file    Attends religious service: Not on file    Active member of club or organization: Not on file    Attends meetings of clubs or organizations: Not on file    Relationship status: Not on file  . Intimate partner violence:    Fear of current or ex partner: Not on file    Emotionally abused: Not on file    Physically abused: Not on file    Forced sexual activity: Not on file  Other Topics Concern  . Not on file  Social History Narrative   Lives at home with sister and private home care  ,  reports that he has quit smoking. He has never used smokeless tobacco. He reports that he drank alcohol. He reports that he has current or past drug history.   Family history   His family history includes Diabetes Mellitus II in his brother. There is no history of CAD or Stroke.   Allergies No Known Allergies  Home meds  Prior to Admission medications   Medication Sig Start Date End Date Taking? Authorizing Provider  acetaminophen (TYLENOL) 500 MG tablet Place 1,000 mg into feeding tube 3 (three) times daily.     [provider]  Amino Acids-Protein Hydrolys (FEEDING SUPPLEMENT, PRO-STAT SUGAR FREE 64,) LIQD Take 30 mLs by mouth at bedtime.    [provider]  amLODipine (NORVASC) 10 MG tablet Place 1 tablet (10 mg total) into feeding tube daily. 11/23/17   Kayleen Memos, DO  aspirin 81 MG chewable tablet Place 1 tablet (81 mg total)  into feeding tube daily. Patient taking differently: Place 325 mg into feeding tube daily.  04/05/17   Nita Sells, MD  atropine 1 % ophthalmic solution Place 2 drops under the tongue every 8 (eight) hours as needed (excessive secretion). 07/03/17   Lavina Hamman, MD  cloNIDine (CATAPRES - DOSED IN MG/24 HR) 0.1 mg/24hr patch Place 1 patch (0.1 mg total) onto the skin once a week. Patient taking differently: Place 0.1 mg onto the skin every Tuesday.  04/10/17   Nita Sells, MD  clopidogrel (PLAVIX) 75 MG tablet Place 1 tablet (75 mg total) into feeding tube daily. 01/11/17   Cherene Altes, MD  gabapentin (NEURONTIN) 100 MG capsule Take 100 mg See admin instructions by mouth. Open 1 capsule (100 mg) and administer contents via feeding tube three times daily    [provider]  glycopyrrolate (ROBINUL) 1 MG tablet Place 1 tablet (1 mg total) into feeding tube 2 (two) times daily. 11/22/17   Kayleen Memos, DO  ipratropium-albuterol (DUONEB) 0.5-2.5 (3) MG/3ML SOLN Take 3 mLs by nebulization every 4 (four) hours as needed. Patient taking differently: Take 3 mLs by nebulization every 4 (four) hours as needed (shortness of breath/wheezing).  01/10/17   Cherene Altes, MD  montelukast (SINGULAIR) 10 MG tablet Take 10 mg by mouth at bedtime.    [provider]  mouth rinse LIQD solution 10 mLs by Mouth Rinse route 4 (four) times daily.    [provider]  Nutritional Supplements (FEEDING SUPPLEMENT, JEVITY 1.2 CAL,) LIQD Place 1,000 mLs into feeding tube continuous. 11/22/17   Kayleen Memos, DO  pravastatin (PRAVACHOL) 20 MG tablet Place 1 tablet (20 mg total) into feeding tube daily at 6 PM. Patient taking differently: Place 20 mg daily into feeding tube.  01/10/17   Cherene Altes, MD  ranitidine (ZANTAC) 150 MG/10ML syrup Place 10 mLs (150 mg total) into feeding tube daily. 11/23/17   Kayleen Memos, DO  thiamine 100 MG tablet Place 1 tablet (100 mg total) into feeding tube daily. 01/11/17   Cherene Altes, MD  Water For Irrigation, Sterile (FREE WATER) SOLN Place 100 mLs into feeding tube daily. Patient taking differently: Place 100 mLs into feeding tube every 3 (three) hours.  07/03/17   Lavina Hamman, MD      [name], AGACNP-BC [facility] Pgr: [phone] or if no answer [phone] 12/18/2017, 6:13 AM

## 2017-12-18 NOTE — ED Provider Notes (Signed)
  Physical Exam  BP (!) 141/79   Pulse (!) 103   Temp 98.7 F (37.1 C) (Oral)   Resp 18   Wt 74.8 kg   SpO2 93%   BMI 22.38 kg/m   Physical Exam  ED Course/Procedures     Procedures  MDM  Assuming care of patient from Dr. Verta Ellen.   Patient in the ED for emesis. Pt has increased O2 requirement with emesis. He has trach in place for chronic respiratory failure.  Workup thus far shows aspiration pneumonia/pneumonitis like picture. Lactate is clear. CT ordered, results pending.  Concerning findings are as following : hypoxia Important pending results are CT abdoment  According to Dr. Verta Ellen, plan is to admit patient post CT scan - which has been ordered to r/o SBO.   Patient had no complains, no concerns from the nursing side. Will continue to monitor.        Varney Biles, MD 12/18/17 [phone]

## 2017-12-18 NOTE — Progress Notes (Signed)
[place] TEAM 1 - Stepdown/ICU TEAM  Scott Rivera  HDQ:[id number] DOB: October 02, 1950 DOA: 12/17/2017 PCP: Jamesetta Orleans, PA-C    Brief Narrative:  67 y.o. male with a hx of HTN, HLD, stroke > nonverbal s/p trach & PEG tube, CKD 3, GERD, and aspiration pneumonia who presented with nausea and vomiting. He vomited dark-colored material negative on hemaoccult test in ED. Initial O2 sat was 81% on RA at home.   In the ED he was found to have a distended abdomen.  Pt had a large amount of dark brown emesis while on the CT table, w/ oxygen desaturation to 88%. He subsequently expelled several thick mucous plugs and O2 stat comes back to 95%.Chest x-ray showed mild opacity in the right middle lobe.   Significant Events: 9/3 admit   Subjective: Pt is seen for a f/u visit.  He is lethargic but appears otherwise stable at the time of my visit.    Assessment & Plan:  Acute hypoxic respiratory failure - RML aspiration PNA - chronic trach-dependence Due to combination of mucous plugging and aspiration   Fecal impaction - recurrent vomiting  CT abdomen/pelvis showed possible esophagitis, and fecal impaction  Hx of Cerebellar infarct  Cont ASA and Plavix   HTN  GERD Pepcid  Malnutrition of moderate degree Nutrition supplement  S/P PEG tube placement   DVT prophylaxis: SCDs Code Status: FULL CODE Family Communication: spoke w/ his sister at the bedside  Disposition Plan: SDU  Consultants:  PCCM  Antimicrobials:  Unasyn 9/2 > Vanc 9/3 >  Objective: Blood pressure 132/70, pulse (!) 108, temperature 98.2 F (36.8 C), temperature source Oral, resp. rate (!) 24, weight 74.8 kg, SpO2 100 %.  Intake/Output Summary (Last 24 hours) at 12/18/2017 1056 Last data filed at 12/18/2017 0547 Gross per 24 hour  Intake -  Output 800 ml  Net -800 ml   Filed Weights   12/17/17 1944  Weight: 74.8 kg    Examination: Pt was seen for a f/u visit.    CBC: Recent Labs  Lab 12/17/17 1952  12/18/17 0815  WBC 13.9* 12.1*  NEUTROABS 10.6*  --   HGB 15.5 17.1*  HCT 49.9 55.6*  MCV 96.0 97.0  PLT 318 119   Basic Metabolic Panel: Recent Labs  Lab 12/17/17 1949 12/18/17 0815  NA 138 142  K 4.7 4.7  CL 99 98  CO2 31 33*  GLUCOSE 88 83  BUN 37* 36*  CREATININE 1.03 1.25*  CALCIUM 10.0 10.2   GFR: Estimated Creatinine Clearance: 60.7 mL/min (A) (by C-G formula based on SCr of 1.25 mg/dL (H)).  Liver Function Tests: Recent Labs  Lab 12/17/17 1949  AST 21  ALT 16  ALKPHOS 101  BILITOT 0.8  PROT 8.2*  ALBUMIN 3.2*    Coagulation Profile: Recent Labs  Lab 12/17/17 1952  INR 1.03    HbA1C: Hgb A1c MFr Bld  Date/Time Value Ref Range Status  12/09/2016 02:19 PM 5.7 (H) 4.8 - 5.6 % Final    Comment:    (NOTE) Pre diabetes:          5.7%-6.4% Diabetes:              >6.4% Glycemic control for   <7.0% adults with diabetes   10/21/2016 02:53 AM 5.4 4.8 - 5.6 % Final    Comment:    (NOTE)         Pre-diabetes: 5.7 - 6.4         Diabetes: >  6.4         Glycemic control for adults with diabetes: <7.0      Recent Results (from the past 240 hour(s))  Culture, respiratory     Status: None (Preliminary result)   Collection Time: 12/18/17  3:33 AM  Result Value Ref Range Status   Specimen Description TRACHEAL ASPIRATE  Final   Special Requests NONE  Final   Gram Stain   Final    ABUNDANT WBC PRESENT, PREDOMINANTLY PMN FEW GRAM VARIABLE ROD RARE GRAM POSITIVE COCCI Performed at [facility], [address] [place], [place] [address]    Culture PENDING  Incomplete   Report Status PENDING  Incomplete  MRSA PCR Screening     Status: None   Collection Time: 12/18/17  6:33 AM  Result Value Ref Range Status   MRSA by PCR NEGATIVE NEGATIVE Final    Comment:        The GeneXpert MRSA Assay (FDA approved for NASAL specimens only), is one component of a comprehensive MRSA colonization surveillance program. It is not intended to diagnose  MRSA infection nor to guide or monitor treatment for MRSA infections. Performed at [facility], [place] [address], [place], [place] [address]      Scheduled Meds: . ipratropium  0.5 mg Nebulization Q6H  . levalbuterol  1.25 mg Nebulization Q6H  . pantoprazole  40 mg Intravenous Q12H  . polyethylene glycol  17 g Per Tube BID  . sodium phosphate  1 enema Rectal Once   Continuous Infusions: . sodium chloride 75 mL/hr at 12/18/17 0415  . ampicillin-sulbactam (UNASYN) IV 3 g (12/18/17 0415)  . vancomycin 1,250 mg (12/18/17 1040)  . vancomycin       LOS: 0 days   Time spent: No Charge  Cherene Altes, MD [facility] Office  [phone] Pager - Text Page per Amion as per below:  On-Call/Text Page:      [email or web address]  If 7PM-7AM, please contact night-coverage [email or web address] 12/18/2017, 10:56 AM

## 2017-12-18 NOTE — ED Notes (Signed)
Pt sister [name] would like an update [phone]

## 2017-12-18 NOTE — H&P (Addendum)
History and Physical    Scott Rivera KWI:[id number] DOB: May 15, 1950 DOA: 12/17/2017  Referring MD/NP/PA:   PCP: Jamesetta Orleans, PA-C   Patient coming from:  The patient is coming from home.  At baseline, pt is dependent for most of ADL.   Chief Complaint: Nausea, vomiting  HPI: Scott Rivera is a 67 y.o. male with medical history significant of hypertension, hyperlipidemia, stroke (s/p of tracheostomy, PEG tube placement), nonverbal status, CKD 3, GERD, aspiration pneumonia, who presents with nausea vomiting.  Patient is nonverbal due to previous stroke. He is unable to provide any medical history, therefore, most of the history is obtained by discussing the case with ED physician, per EMS report, and with the nursing staff.  Per report, pt is ransported from home by EMS due to nausea vomiting. He vomited dark-colored materials. Not sure if it is blood or not. The gastric material is negative for heme occult test in ED. Pt was suctioned at home, initial O2 sat was 81% RA at home. Patient's abdomen is extended. CT of abdomen/pelvis was ordered by EDP. Per ED RN's note, pt had a large amount of dark brown emesis while pt was on CT table. Pt had oxygen desaturation to 88%. Pt expelled several thick mucous plugs. He was suctioned again. Pt O2 stat comes back to 95%. When I saw pt in ED, he is non-verbal, partially following commands.  No active cough, vomiting or diarrhea noted at this moment.  ED Course: pt was found to have WBC 13.9, hemoglobin 15.5, lactic acid 0.86, INR 1.03, electrolytes renal function okay, tachycardia, tachypnea, temperature 98.  Chest x-ray showed mild opacity in the right middle lobe.  Patient is admitted to stepdown as inpatient.  CT-abdomen/pelvis showed: 1. Significant stool burden throughout the colon with fecal impaction as above. 2. Thick-walled distal esophagus query esophagitis. 3. Gastrostomy tube within the distal stomach. Foley decompressed urinary  bladder. 4. Aortic atherosclerosis. 5. Compensatory hypertrophy of the right kidney with atrophic left kidney as before  Review of Systems: Reviewed due to nonverbal status.  Allergy: No Known Allergies  Past Medical History:  Diagnosis Date  . Anemia due to chronic kidney disease   . Aspiration pneumonia ([place])   . Chronic kidney disease, stage 3 (moderate) (HCC)   . Hypertension   . Stroke [facility])     Past Surgical History:  Procedure Laterality Date  . IR GASTROSTOMY TUBE MOD SED  10/27/2016  . IR PATIENT EVAL TECH 0-60 MINS  01/05/2017  . IR PATIENT EVAL TECH 0-60 MINS  01/09/2017  . IR PATIENT EVAL TECH 0-60 MINS  03/03/2017  . IR PATIENT EVAL TECH 0-60 MINS  04/03/2017  . IR REPLACE G-TUBE SIMPLE WO FLUORO  11/20/2017  . IR [place] TUBE PERCUT W/FLUORO  07/23/2017  . KNEE SURGERY    . TRACHEOSTOMY TUBE PLACEMENT      Social History:  reports that he has quit smoking. He has never used smokeless tobacco. He reports that he drank alcohol. He reports that he has current or past drug history.  Family History:  Family History  Problem Relation Age of Onset  . Diabetes Mellitus II Brother   . CAD Neg Hx   . Stroke Neg Hx      Prior to Admission medications   Medication Sig Start Date End Date Taking? Authorizing Provider  acetaminophen (TYLENOL) 500 MG tablet Place 1,000 mg into feeding tube 3 (three) times daily.     [provider]  Amino Acids-Protein Hydrolys (FEEDING SUPPLEMENT, PRO-STAT SUGAR FREE 64,) LIQD Take 30 mLs by mouth at bedtime.    [provider]  amLODipine (NORVASC) 10 MG tablet Place 1 tablet (10 mg total) into feeding tube daily. 11/23/17   Kayleen Memos, DO  aspirin 81 MG chewable tablet Place 1 tablet (81 mg total) into feeding tube daily. Patient taking differently: Place 325 mg into feeding tube daily.  04/05/17   Nita Sells, MD  atropine 1 % ophthalmic solution Place 2 drops under the tongue every 8 (eight) hours  as needed (excessive secretion). 07/03/17   Lavina Hamman, MD  cloNIDine (CATAPRES - DOSED IN MG/24 HR) 0.1 mg/24hr patch Place 1 patch (0.1 mg total) onto the skin once a week. Patient taking differently: Place 0.1 mg onto the skin every Tuesday.  04/10/17   Nita Sells, MD  clopidogrel (PLAVIX) 75 MG tablet Place 1 tablet (75 mg total) into feeding tube daily. 01/11/17   Cherene Altes, MD  gabapentin (NEURONTIN) 100 MG capsule Take 100 mg See admin instructions by mouth. Open 1 capsule (100 mg) and administer contents via feeding tube three times daily    [provider]  glycopyrrolate (ROBINUL) 1 MG tablet Place 1 tablet (1 mg total) into feeding tube 2 (two) times daily. 11/22/17   Kayleen Memos, DO  ipratropium-albuterol (DUONEB) 0.5-2.5 (3) MG/3ML SOLN Take 3 mLs by nebulization every 4 (four) hours as needed. Patient taking differently: Take 3 mLs by nebulization every 4 (four) hours as needed (shortness of breath/wheezing).  01/10/17   Cherene Altes, MD  montelukast (SINGULAIR) 10 MG tablet Take 10 mg by mouth at bedtime.    [provider]  mouth rinse LIQD solution 10 mLs by Mouth Rinse route 4 (four) times daily.    [provider]  Nutritional Supplements (FEEDING SUPPLEMENT, JEVITY 1.2 CAL,) LIQD Place 1,000 mLs into feeding tube continuous. 11/22/17   Kayleen Memos, DO  pravastatin (PRAVACHOL) 20 MG tablet Place 1 tablet (20 mg total) into feeding tube daily at 6 PM. Patient taking differently: Place 20 mg daily into feeding tube.  01/10/17   Cherene Altes, MD  ranitidine (ZANTAC) 150 MG/10ML syrup Place 10 mLs (150 mg total) into feeding tube daily. 11/23/17   Kayleen Memos, DO  thiamine 100 MG tablet Place 1 tablet (100 mg total) into feeding tube daily. 01/11/17   Cherene Altes, MD  Water For Irrigation, Sterile (FREE WATER) SOLN Place 100 mLs into feeding tube daily. Patient taking differently: Place 100 mLs into feeding tube every  3 (three) hours.  07/03/17   Lavina Hamman, MD    Physical Exam: Vitals:   12/18/17 0015 12/18/17 0045 12/18/17 0100 12/18/17 0229  BP: 117/78 115/61 110/60   Pulse: (!) 103 (!) 108 (!) 103   Resp: 16 20 13    Temp:      TempSrc:      SpO2: 94% 92% 95% 92%  Weight:       General: Not in acute distress HEENT:       Eyes: PERRL, EOMI, no scleral icterus.       ENT: No discharge from the ears and nose       Neck: No JVD, no bruit, no mass felt. Heme: No neck lymph node enlargement. Cardiac: S1/S2, RRR, No murmurs, No gallops or rubs. Respiratory: Has rhonchi bilaterally. S/p of trach GI: distended, no organomegaly, BS present. S/p of PEG tube GU: No hematuria  Ext: No pitting leg edema bilaterally. 2+DP/PT pulse bilaterally. Musculoskeletal: No joint deformities, No joint redness or warmth, no limitation of ROM in spin. Skin: No rashes.  Neuro: alert, nonverbal status, cranial nerves II-XII grossly intact. Psych: Patient is not psychotic, no suicidal or hemocidal ideation.  Labs on Admission: I have personally reviewed following labs and imaging studies  CBC: Recent Labs  Lab 12/17/17 1952  WBC 13.9*  NEUTROABS 10.6*  HGB 15.5  HCT 49.9  MCV 96.0  PLT 341   Basic Metabolic Panel: Recent Labs  Lab 12/17/17 1949  NA 138  K 4.7  CL 99  CO2 31  GLUCOSE 88  BUN 37*  CREATININE 1.03  CALCIUM 10.0   GFR: Estimated Creatinine Clearance: 73.6 mL/min (by C-G formula based on SCr of 1.03 mg/dL). Liver Function Tests: Recent Labs  Lab 12/17/17 1949  AST 21  ALT 16  ALKPHOS 101  BILITOT 0.8  PROT 8.2*  ALBUMIN 3.2*   No results for input(s): LIPASE, AMYLASE in the last 168 hours. No results for input(s): AMMONIA in the last 168 hours. Coagulation Profile: Recent Labs  Lab 12/17/17 1952  INR 1.03   Cardiac Enzymes: No results for input(s): CKTOTAL, CKMB, CKMBINDEX, TROPONINI in the last 168 hours. BNP (last 3 results) No results for input(s): PROBNP in  the last 8760 hours. HbA1C: No results for input(s): HGBA1C in the last 72 hours. CBG: No results for input(s): GLUCAP in the last 168 hours. Lipid Profile: No results for input(s): CHOL, HDL, LDLCALC, TRIG, CHOLHDL, LDLDIRECT in the last 72 hours. Thyroid Function Tests: No results for input(s): TSH, T4TOTAL, FREET4, T3FREE, THYROIDAB in the last 72 hours. Anemia Panel: No results for input(s): VITAMINB12, FOLATE, FERRITIN, TIBC, IRON, RETICCTPCT in the last 72 hours. Urine analysis:    Component Value Date/Time   COLORURINE YELLOW 09/30/2017 1800   APPEARANCEUR CLOUDY (A) 09/30/2017 1800   LABSPEC 1.015 09/30/2017 1800   PHURINE 6.0 09/30/2017 1800   GLUCOSEU NEGATIVE 09/30/2017 1800   HGBUR MODERATE (A) 09/30/2017 1800   BILIRUBINUR NEGATIVE 09/30/2017 1800   KETONESUR NEGATIVE 09/30/2017 1800   PROTEINUR 100 (A) 09/30/2017 1800   NITRITE NEGATIVE 09/30/2017 1800   LEUKOCYTESUR LARGE (A) 09/30/2017 1800   Sepsis Labs: @LABRCNTIP (procalcitonin:4,lacticidven:4) )No results found for this or any previous visit (from the past 240 hour(s)).   Radiological Exams on Admission: Ct Abdomen Pelvis W Contrast  Result Date: 12/18/2017 CLINICAL DATA:  Dark emesis x1 at home as well as on the CT table. Nonverbal. EXAM: CT ABDOMEN AND PELVIS WITH CONTRAST TECHNIQUE: Multidetector CT imaging of the abdomen and pelvis was performed using the standard protocol following bolus administration of intravenous contrast. CONTRAST:  138mL ISOVUE-300 IOPAMIDOL (ISOVUE-300) INJECTION 61% COMPARISON:  06/20/2017 CT FINDINGS: Lower chest: Top-normal size heart. Thick-walled distal esophagus with small hiatal hernia question esophagitis. Bibasilar peribronchial thickening with subsegmental atelectasis at each lung base. Hepatobiliary: Probable subcapsular cyst in the right hepatic lobe measuring 1 cm. No biliary dilatation. Physiologic distention of the gallbladder without stones. Pancreas: No pancreatic mass  or pathologic ductal dilatation. No inflammation. Spleen: Normal Adrenals/Urinary Tract: Normal bilateral adrenal glands. Markedly atrophic left kidney with compensatory hypertrophy of the right kidney. No obstructive uropathy or mass. The urinary bladder is unremarkable and decompressed by Foley catheter. Stomach/Bowel: Significant stool retention with stool ball in the rectum measuring 11 x 8.9 x 10.9 cm consistent with fecal impaction. No small bowel dilatation or obstruction. No inflammation. Percutaneous gastrostomy tube is in place. Vascular/Lymphatic:  Aortic atherosclerosis without aneurysm. No adenopathy. Reproductive: Prostate is unremarkable. Other: No free air nor free fluid. Musculoskeletal: Sagittal midline suture material. Mild dextroconvex curvature of the mid lumbar spine with multilevel degenerative disc and facet arthritis. 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 No collapse. IMPRESSION: 1. Significant stool burden throughout the colon with fecal impaction as above. 2. Thick-walled distal esophagus query esophagitis. 3. Gastrostomy tube within the distal stomach. Foley decompressed urinary bladder. 4. Aortic atherosclerosis. 5. Compensatory hypertrophy of the right kidney with atrophic left kidney as before. Electronically Signed   By: Ashley Royalty M.D.   On: 12/18/2017 00:53   Dg Chest Portable 1 View  Result Date: 12/17/2017 CLINICAL DATA:  Emesis. EXAM: PORTABLE CHEST 1 VIEW COMPARISON:  November 20, 2017 FINDINGS: That the tracheostomy tube is in good position. No pneumothorax. The left-sided infiltrate seen previously has resolved. Minimal atelectasis in the left base. There is mild opacity in the medial right lung base, slightly more prominent the interval. The cardiomediastinal silhouette is stable. No other interval changes. IMPRESSION: 1. Mild opacity in the medial right lung base is mildly more prominent compared to the previous study. This could represent a  combination of vascular crowding and atelectasis. A developing infiltrate is not excluded. Recommend clinical correlation and follow-up to resolution. Electronically Signed   By: Dorise Bullion III M.D   On: 12/17/2017 20:03     EKG: Independently reviewed.  Sinus rhythm, tachycardia, QTC 431, bilateral atrial enlargement, nonspecific T wave change.   Assessment/Plan Principal Problem:   Acute respiratory failure with hypoxia (HCC) Active Problems:   Cerebellar infarct (HCC)   Essential hypertension   Sepsis ([place])   H/O tracheostomy   S/P percutaneous endoscopic gastrostomy (PEG) tube placement (HCC)   Aspiration pneumonia (HCC)   Nausea and vomiting   Malnutrition of moderate degree   Acute respiratory failure with hypoxia due to possible aspiration pneumonia in the setting chronic trach-dependence and sepsis: Chest x-ray showed mild opacity in the right middle lobe, indicating possible aspiration pneumonia. Mucous plug may also have contributed partially. Pt meets criteria for sepsis with leukocytosis, tachycardia and tachypnea.  Lactic acid is normal.  Currently hemodynamically stable  - will admit to tele bed as inpt - IV Unasyn - cough syrup for cough  - Atroven nebs and prn Xopenex Neb prn for SOB - consult RT for trach managment - Follow up blood culture x2, sputum culture - will get Procalcitonin and trend lactic acid level per sepsis protocol - IVF: 1L of NS bolus in ED, followed by 75 mL per hour of NS   Hx of Cerebellar infarct ([place]): -hold ASA and plavix since we cannot completely rule out a GI bleeding -Continue pravastatin  Essential hypertension -IV hydralazine as needed  Nausea and vomiting: pt has vomited dark brown materials.  Gastric material is tested and negative for Hemoccult test.  CT abdomen/pelvis showed possible esophagitis, and fecal impact.  We still cannot completely rule out possibility of a GI bleeding.  Pending FOBT. Hgb stable  15.5 -miralax -fleet enama -prn zofran -start IV protonix 40 mg bid  -f/u CBC in AM - INR/PTT/type & screen  Addendum: at about 4;00 am, RN reported that pt had another episode of vomiting with large amount of brown emesis. HR 110s and O2 stat 89% at 10-13 L O2 -will place NG tube in  -will hold all oral meds now -will get ABG stat -consult PCCM   GERD: -Pepcid  Malnutrition of moderate degree -  Nutrition supplement  S/P percutaneous endoscopic gastrostomy (PEG) tube placement [facility]): -no acute issues  Inpatient status:  # Patient requires inpatient status due to high intensity of service, high risk for further deterioration and high frequency of surveillance required.  I certify that at the point of admission it is my clinical judgment that the patient will require inpatient hospital care spanning beyond 2 midnights from the point of admission.  . This patient has multiple chronic comorbidities including hypertension, hyperlipidemia, stroke (s/p of tracheostomy, PEG tube placement), nonverbal status, CKD 3, GERD, aspiration pneumonia. . Now patient has presenting symptoms include nausea and vomiting with dark material risk, respiratory distress, oxygen desaturation . The worrisome physical exam findings include abdominal distention nonverbal status. . The initial radiographic and laboratory data are worrisome because of CXR showed mild opacity in the medial right lung base which is mildly more prominent compared to the previous study.  Patient has leukocytosis, tachycardia and tachypnea, meeting criteria for sepsis.  CT abdomen/pelvis showed possible esophagitis, fecal impact. . Current medical needs: please see my assessment and plan    DVT ppx: SCD Code Status: Full code Family Communication: None at bed side. Disposition Plan:  Anticipate discharge back to previous home environment Consults called:  none Admission status:    SDU/inpation       Date of Service 12/18/2017     Ivor Costa [facility] Pager [phone]  If 7PM-7AM, please contact night-coverage [email or web address] Password [email or web address] 12/18/2017, 2:36 AM

## 2017-12-18 NOTE — Progress Notes (Signed)
ABG results called to CCM, no new orders at this time.

## 2017-12-18 NOTE — Progress Notes (Signed)
Fleet enema given and patient tolerated well without vomiting. Sister in room and c/o patient decreased response. He is sleepy, responds to pain and does not follow commands.

## 2017-12-18 NOTE — Progress Notes (Signed)
eLink Physician-Brief Progress Note Patient Name: Scott Rivera DOB: 05/07/1950 MRN: [id number]   Date of Service  12/18/2017  HPI/Events of Note  Oliguria  eICU Interventions  Will order: 1. Bolus with 0.9 NaCl 1 liter IV over 1 hour now.  2. Monitor CVP now and Q 4 hours.      Intervention Category Intermediate Interventions: Oliguria - evaluation and management  Heavenleigh Petruzzi [place] 12/18/2017, 6:44 AM

## 2017-12-18 NOTE — ED Notes (Signed)
RN to CT with pt, large amount of dark brown emesis while on CT table, pt held sitting up while vomiting. RT and Charge to CT room to assist. Pt suctioned, sats remained 93-94%. MD notified, CT completed. Upon return pt cleaned, sats maintained 88% until pt expelled several thick mucous plugs, pt suctioned again, Pt tolerated well. Pt resting now with sats 95%.

## 2017-12-18 NOTE — Significant Event (Addendum)
Rapid Response Event Note  Overview: Time Called: 0402 Arrival Time: 0405 Event Type: Respiratory  Initial Focused Assessment: Called by RT about patient having low saturations and aspirating. Patient has a chronic trach, has been aspirating (history of aspiration, noted in chart). Patient vomited while getting a  CT earlier this shift.  RN and RT have suctioned the patient several times and patient maxed out oxygen via TC. Saturations are not maintaining above 90%. Patient was alert but does not seem oriented (hard to establish baseline, since patient is s/p CVA and nonverbal). Not in acute respiratory distress, RR 18-20, lung sounds: rhonchi throughout, diminished on Left, poor air movement overall, poor effort - shallow breaths. TRH NP was paged and TRH MD came to assess patient.   Interventions: - STAT CXR - Phenergan x 1 for vomiting - STAT ABG - NGT placed - 300 cc (dark brown - blood tinged) - Frequent suctioning of trach  Plan of Care: - Oxygenation improved after NGT was placed. Oxygen was weaned to 60% and saturations are maintaining. PCCM to manage trach/pulmonary care. - Repeat ABG at 0600, ABG okay, patient to remain on SDU - Received a call from staff at 0650 about NGT being dislodged and desaturating into the mid 80s. - NGT X 2 re-attempted, patient became agitated and did not tolerate NGT insertion.  - RN to follow with MD, perhaps NGT placement under fluoroscopy. Oxygen was increased briefly for desaturations then weaned back down.   Event Summary: Name of Physician Notified: [facility] NP [place], Dr. Blaine Hamper at [phone]  Name of Consulting MD: PCCM in [facility] by Dr. Blaine Hamper  Outcome: Stayed in room and stabalized  End Time [place], [place]

## 2017-12-18 NOTE — Progress Notes (Addendum)
PCCM Progress Note  Called by RT with  11 am ABG results Chronic aspiration/ mucus plugging RLL aspiration pneumonia 40% TC down from 50% earlier this am Saturations are 100% per monitor PH-7.329 PCO2-65.2 PO2- 67   Bicarb- 33  Compensated Blood Gas  Pt. Examined, RR is 24 with coarse rhonchi throughout Opens eyes to call of name Afebrile Follows simple commands  In NAD  Plan: See recommendations per consult note 12/18/2017 5 am Saturation goal 88-92% CO2 per BMET 33 Consider further ABG's per Primary team to evaluate for further return to baseline. We are available as needed.    Magdalen Spatz, AGACNP-BC [facility] Pulmonary/Critical Care Medicine Pager # [phone] 12/18/2017 12:25 PM  Attending Note:  68 year old male with CVA S/P tracheostomy and PEG placement for chronic dysphagia presenting with AMS and acute hypercarbic respiratory failure.  On exam, he is much more alert with decreased BS at the bases.  I reviewed CXR myself, RLL infiltrate noted.  Discussed with PCCM-NP.  Acute hypercarbic respiratory failure: sedation vs acute PNA vs fluid overload, likely a combination of all 3.  - Maintain trach patent, no PMV  - F/U ABG noted, normal pH at this point, no need for mechanical ventilation  Aspiration PNA  - F/U on cultures  - Unasyn as ordered  Pulmonary edema: with CKD  - Diureses as renal function allows  - Maintain even at this point  Hypoxemia:  - Titrate O2 for sat of 88-92%  Dysphagia:  - SLP consult, will likely need to discuss PMV use.  Trach status:  - Maintain current trach type and size for now  PCCM will f/u intermittently for trach care, please call back sooner if needed.  Note for services rendered on 9/3.  Patient seen and examined, agree with above note.  I dictated the care and orders written for this patient under my direction.  Rush Farmer, [place]

## 2017-12-18 NOTE — Progress Notes (Signed)
03:15 Pt arrived from ED. Vital signs stable. CHG bath completed. Unable to assess orientation but pt is alert. While laying the patient to roll him, he had several large emeses. Emesis came out through mouth and through trach. RN called RT and paged on call NP and admitting MD. MD came to the bedside. See RRRN note.   ~06:45 RN received a call from NT to check on patient. When this RN arrived in the room the patient was gagging, appearing not able to breathe with the NG tube protruding from his mouth. This RN removed the NG tube. Called RRRN and suctioned the pt's trach. NG tube reinsertion attempted. Pt became agitated, trying to hit and bite staff. New advice given from [place]. Will pass on to day shift RN.

## 2017-12-19 ENCOUNTER — Inpatient Hospital Stay (HOSPITAL_COMMUNITY): Payer: Medicare HMO

## 2017-12-19 DIAGNOSIS — J810 Acute pulmonary edema: Secondary | ICD-10-CM

## 2017-12-19 DIAGNOSIS — J9601 Acute respiratory failure with hypoxia: Secondary | ICD-10-CM

## 2017-12-19 DIAGNOSIS — J9602 Acute respiratory failure with hypercapnia: Secondary | ICD-10-CM

## 2017-12-19 DIAGNOSIS — B449 Aspergillosis, unspecified: Secondary | ICD-10-CM

## 2017-12-19 LAB — PHOSPHORUS: Phosphorus: 3.7 mg/dL (ref 2.5–4.6)

## 2017-12-19 LAB — COMPREHENSIVE METABOLIC PANEL
ALT: 14 U/L (ref 0–44)
AST: 26 U/L (ref 15–41)
Albumin: 2.8 g/dL — ABNORMAL LOW (ref 3.5–5.0)
Alkaline Phosphatase: 78 U/L (ref 38–126)
Anion gap: 8 (ref 5–15)
BUN: 28 mg/dL — ABNORMAL HIGH (ref 8–23)
CO2: 28 mmol/L (ref 22–32)
Calcium: 9.5 mg/dL (ref 8.9–10.3)
Chloride: 109 mmol/L (ref 98–111)
Creatinine, Ser: 1.14 mg/dL (ref 0.61–1.24)
GFR calc Af Amer: >60 mL/min (ref >60)
GFR calc non Af Amer: >60 mL/min (ref >60)
Glucose, Bld: 87 mg/dL (ref 70–99)
Potassium: 5.5 mmol/L — ABNORMAL HIGH (ref 3.5–5.1)
Sodium: 145 mmol/L (ref 135–145)
Total Bilirubin: 1.3 mg/dL — ABNORMAL HIGH (ref 0.3–1.2)
Total Protein: 7.8 g/dL (ref 6.5–8.1)

## 2017-12-19 LAB — CBC
HCT: 50.1 % (ref 39.0–52.0)
Hemoglobin: 14.9 g/dL (ref 13.0–17.0)
MCH: 30 pg (ref 26.0–34.0)
MCHC: 29.7 g/dL — ABNORMAL LOW (ref 30.0–36.0)
MCV: 100.8 fL — ABNORMAL HIGH (ref 78.0–100.0)
Platelets: 181 10*3/uL (ref 150–400)
RBC: 4.97 MIL/uL (ref 4.22–5.81)
RDW: 15.7 % — ABNORMAL HIGH (ref 11.5–15.5)
WBC: 12 10*3/uL — ABNORMAL HIGH (ref 4.0–10.5)

## 2017-12-19 LAB — MAGNESIUM: Magnesium: 2.1 mg/dL (ref 1.7–2.4)

## 2017-12-19 MED ORDER — BISACODYL 10 MG RE SUPP
10.0000 mg | Freq: Every day | RECTAL | Status: DC
  Administered 2017-12-19 – 2017-12-21 (×3): 10 mg via RECTAL
  Filled 2017-12-19 (×3): qty 1

## 2017-12-19 MED ORDER — CLOPIDOGREL BISULFATE 75 MG PO TABS
75.0000 mg | ORAL_TABLET | Freq: Every day | ORAL | Status: DC
  Administered 2017-12-19 – 2017-12-21 (×3): 75 mg
  Filled 2017-12-19 (×3): qty 1

## 2017-12-19 MED ORDER — SODIUM CHLORIDE 0.9 % IV BOLUS
1000.0000 mL | Freq: Once | INTRAVENOUS | Status: AC
  Administered 2017-12-19: 1000 mL via INTRAVENOUS

## 2017-12-19 MED ORDER — ASPIRIN 81 MG PO CHEW
81.0000 mg | CHEWABLE_TABLET | Freq: Every day | ORAL | Status: DC
  Administered 2017-12-19 – 2017-12-21 (×3): 81 mg
  Filled 2017-12-19 (×3): qty 1

## 2017-12-19 MED ORDER — FREE WATER
100.0000 mL | Freq: Three times a day (TID) | Status: DC
  Administered 2017-12-19 – 2017-12-21 (×5): 100 mL

## 2017-12-19 MED ORDER — JEVITY 1.2 CAL PO LIQD
1000.0000 mL | ORAL | Status: DC
  Administered 2017-12-19: 1000 mL
  Filled 2017-12-19 (×4): qty 1000

## 2017-12-19 MED ORDER — FUROSEMIDE 10 MG/ML IJ SOLN
40.0000 mg | Freq: Once | INTRAMUSCULAR | Status: AC
  Administered 2017-12-19: 40 mg via INTRAVENOUS
  Filled 2017-12-19: qty 4

## 2017-12-19 MED ORDER — HEPARIN SODIUM (PORCINE) 5000 UNIT/ML IJ SOLN
5000.0000 [IU] | Freq: Three times a day (TID) | INTRAMUSCULAR | Status: DC
  Administered 2017-12-19 – 2017-12-21 (×6): 5000 [IU] via SUBCUTANEOUS
  Filled 2017-12-19 (×5): qty 1

## 2017-12-19 MED ORDER — JEVITY 1.2 CAL PO LIQD
1000.0000 mL | ORAL | Status: DC
  Administered 2017-12-19: 1000 mL
  Filled 2017-12-19: qty 1000

## 2017-12-19 MED ORDER — DEXTROSE-NACL 5-0.9 % IV SOLN
INTRAVENOUS | Status: DC
  Administered 2017-12-19: 15:00:00 via INTRAVENOUS

## 2017-12-19 MED ORDER — SODIUM POLYSTYRENE SULFONATE 15 GM/60ML PO SUSP
30.0000 g | Freq: Once | ORAL | Status: AC
  Administered 2017-12-19: 30 g
  Filled 2017-12-19: qty 120

## 2017-12-19 MED ORDER — PRAVASTATIN SODIUM 20 MG PO TABS
20.0000 mg | ORAL_TABLET | Freq: Every day | ORAL | Status: DC
  Administered 2017-12-19 – 2017-12-21 (×3): 20 mg
  Filled 2017-12-19 (×3): qty 1

## 2017-12-19 MED ORDER — MAGNESIUM HYDROXIDE 400 MG/5ML PO SUSP
30.0000 mL | Freq: Two times a day (BID) | ORAL | Status: AC
  Administered 2017-12-19 (×2): 30 mL
  Filled 2017-12-19 (×2): qty 30

## 2017-12-19 MED ORDER — VITAMIN B-1 100 MG PO TABS
100.0000 mg | ORAL_TABLET | Freq: Every day | ORAL | Status: DC
  Administered 2017-12-19 – 2017-12-21 (×3): 100 mg
  Filled 2017-12-19 (×3): qty 1

## 2017-12-19 MED ORDER — JEVITY 1.2 CAL PO LIQD: 1000.0000 mL | ORAL | Status: DC

## 2017-12-19 NOTE — Progress Notes (Signed)
Patient refuses CPT at this time. MD was in the room and is aware that patient refused.

## 2017-12-19 NOTE — Progress Notes (Signed)
Patient refused treatment and suction.  When RT tries to get close to trach the patient becomes combative.  Patient stable.  RT will continue to monitor.

## 2017-12-19 NOTE — Progress Notes (Signed)
[name] TEAM 1 - Stepdown/ICU TEAM  Scott Rivera  QBH:[id number] DOB: 06-09-1950 DOA: 12/17/2017 PCP: Jamesetta Orleans, PA-C    Brief Narrative:  67 y.o. male with a hx of HTN, HLD, stroke > nonverbal s/p trach & PEG tube, CKD 3, GERD, and aspiration pneumonia who presented with nausea and vomiting. He vomited dark-colored material negative on hemaoccult test in ED. Initial O2 sat was 81% on RA at home.   In the ED he was found to have a distended abdomen.  Pt had a large amount of dark brown emesis while on the CT table, w/ oxygen desaturation to 88%. He subsequently expelled several thick mucous plugs and O2 stat comes back to 95%.Chest x-ray showed mild opacity in the right middle lobe.   Significant Events: 9/3 admit   Subjective:  Pt is seen for a f/u visit.  He is awake unable to communicate except for a few words, denies any headache chest or abdominal pain, appears comfortable and in no distress.  Moves all 4 extremities to commands.   Assessment & Plan:  Acute hypoxic respiratory failure - RML aspiration PNA - chronic trach-dependence, Due to combination of mucous plugging and aspiration, aspiration due to nausea vomiting caused by bowel impaction, treat underlying cause, continue Unasyn, patient refusing chest PT but encouraged to tolerate if he could, continue supportive measures.  Oxygen via trach mask and nebulizer treatments as needed.   Fecal impaction - recurrent vomiting, CT abdomen/pelvis showed possible esophagitis, and fecal impaction, has initiated aggressive bowel regimen with good results, has had 3-4 bowel movements so far we will continue to monitor.  Sister who is the primary caregiver also educated.  Hx of Cerebellar infarct - Cont ASA and Plavix via PEG tube, he is trach and PEG dependent also largely bed and wheelchair-bound.  Possible esophagitis/ GERD on CT scan.  On twice daily PPI.   HTN - On home meds and stable  GERD Pepcid  Malnutrition of  moderate degree Nutrition supplement  S/P PEG tube placement     DVT prophylaxis: SCDs/Heparin Code Status: FULL CODE Family Communication: spoke w/ his sister at the bedside  Disposition Plan: SDU  Consultants:  PCCM  Anti-infectives (From admission, onward)   Start     Dose/Rate Route Frequency Ordered Stop   12/18/17 2300  vancomycin (VANCOCIN) 500 mg in sodium chloride 0.9 % 100 mL IVPB  Status:  Discontinued     500 mg 100 mL/hr over 60 Minutes Intravenous Every 12 hours 12/18/17 0703 12/18/17 1208   12/18/17 0800  vancomycin (VANCOCIN) 1,250 mg in sodium chloride 0.9 % 250 mL IVPB     1,250 mg 166.7 mL/hr over 90 Minutes Intravenous  Once 12/18/17 0703 12/18/17 1210   12/18/17 0400  Ampicillin-Sulbactam (UNASYN) 3 g in sodium chloride 0.9 % 100 mL IVPB     3 g 200 mL/hr over 30 Minutes Intravenous Every 6 hours 12/17/17 2101     12/17/17 2100  Ampicillin-Sulbactam (UNASYN) 3 g in sodium chloride 0.9 % 100 mL IVPB     3 g 200 mL/hr over 30 Minutes Intravenous  Once 12/17/17 2013 12/17/17 2330      Objective: Blood pressure (!) 153/66, pulse 100, temperature (!) 97.4 F (36.3 C), temperature source Axillary, resp. rate 16, weight 74.8 kg, SpO2 98 %.  Intake/Output Summary (Last 24 hours) at 12/19/2017 1149 Last data filed at 12/19/2017 0519 Gross per 24 hour  Intake 1904.39 ml  Output 1050 ml  Net 854.39  ml   Filed Weights   12/17/17 1944  Weight: 74.8 kg    Examination:  Awake Alert, No new F.N deficits, mobility limited in the R.leg,  Normal affect [place].AT,PERRAL, Trached Supple Neck,No JVD, No cervical lymphadenopathy appriciated.  Symmetrical Chest wall movement, Good air movement bilaterally, CTAB RRR,No Gallops, Rubs or new Murmurs, No Parasternal Heave +ve B.Sounds, Abd Soft, No tenderness, No organomegaly appriciated, No rebound - guarding or rigidity. Peg in place No Cyanosis, Clubbing or edema, No new Rash or bruise, chronic foley    CBC: Recent  Labs  Lab 12/17/17 1952 12/18/17 0815 12/19/17 0459  WBC 13.9* 12.1* 12.0*  NEUTROABS 10.6*  --   --   HGB 15.5 17.1* 14.9  HCT 49.9 55.6* 50.1  MCV 96.0 97.0 100.8*  PLT 318 281 354   Basic Metabolic Panel: Recent Labs  Lab 12/17/17 1949 12/18/17 0815 12/19/17 0459  NA 138 142 145  K 4.7 4.7 5.5*  CL 99 98 109  CO2 31 33* 28  GLUCOSE 88 83 87  BUN 37* 36* 28*  CREATININE 1.03 1.25* 1.14  CALCIUM 10.0 10.2 9.5  MG  --   --  2.1  PHOS  --   --  3.7   GFR: Estimated Creatinine Clearance: 66.5 mL/min (by C-G formula based on SCr of 1.14 mg/dL).  Liver Function Tests: Recent Labs  Lab 12/17/17 1949 12/19/17 0459  AST 21 26  ALT 16 14  ALKPHOS 101 78  BILITOT 0.8 1.3*  PROT 8.2* 7.8  ALBUMIN 3.2* 2.8*    Coagulation Profile: Recent Labs  Lab 12/17/17 1952  INR 1.03    HbA1C: Hgb A1c MFr Bld  Date/Time Value Ref Range Status  12/09/2016 02:19 PM 5.7 (H) 4.8 - 5.6 % Final    Comment:    (NOTE) Pre diabetes:          5.7%-6.4% Diabetes:              >6.4% Glycemic control for   <7.0% adults with diabetes   10/21/2016 02:53 AM 5.4 4.8 - 5.6 % Final    Comment:    (NOTE)         Pre-diabetes: 5.7 - 6.4         Diabetes: >6.4         Glycemic control for adults with diabetes: <7.0      Recent Results (from the past 240 hour(s))  Blood culture (routine x 2)     Status: None (Preliminary result)   Collection Time: 12/17/17  9:23 PM  Result Value Ref Range Status   Specimen Description BLOOD RIGHT UPPER WRIST  Final   Special Requests   Final    BOTTLES DRAWN AEROBIC AND ANAEROBIC Blood Culture adequate volume   Culture   Final    NO GROWTH 2 DAYS Performed at [facility], [address], [place], [place] [address]    Report Status PENDING  Incomplete  Blood culture (routine x 2)     Status: None (Preliminary result)   Collection Time: 12/17/17  9:35 PM  Result Value Ref Range Status   Specimen Description BLOOD RIGHT WRIST  Final    Special Requests   Final    BOTTLES DRAWN AEROBIC ONLY Blood Culture results may not be optimal due to an inadequate volume of blood received in culture bottles   Culture   Final    NO GROWTH 2 DAYS Performed at [facility], [place] [address]  [place], [place] [address]    Report Status PENDING  Incomplete  Culture, respiratory     Status: None (Preliminary result)   Collection Time: 12/18/17  3:33 AM  Result Value Ref Range Status   Specimen Description TRACHEAL ASPIRATE  Final   Special Requests NONE  Final   Gram Stain   Final    ABUNDANT WBC PRESENT, PREDOMINANTLY PMN FEW GRAM VARIABLE ROD RARE GRAM POSITIVE COCCI    Culture   Final    CULTURE REINCUBATED FOR BETTER GROWTH Performed at [facility], [place] [address] [place], [place] [address]    Report Status PENDING  Incomplete  MRSA PCR Screening     Status: None   Collection Time: 12/18/17  6:33 AM  Result Value Ref Range Status   MRSA by PCR NEGATIVE NEGATIVE Final    Comment:        The GeneXpert MRSA Assay (FDA approved for NASAL specimens only), is one component of a comprehensive MRSA colonization surveillance program. It is not intended to diagnose MRSA infection nor to guide or monitor treatment for MRSA infections. Performed at [facility], [place] [address], [place], [place] [address]      Scheduled Meds: . bisacodyl  10 mg Rectal Daily  . cloNIDine  0.1 mg Transdermal Weekly  . free water  100 mL Per Tube Q8H  . heparin injection (subcutaneous)  5,000 Units Subcutaneous Q8H  . ipratropium  0.5 mg Nebulization Q6H  . levalbuterol  1.25 mg Nebulization Q6H  . magnesium hydroxide  30 mL Per Tube BID  . pantoprazole  40 mg Intravenous Q12H  . polyethylene glycol  17 g Per Tube BID  . sodium polystyrene  30 g Per Tube Once  . sorbitol  30 mL Per Tube Daily   Continuous Infusions: . ampicillin-sulbactam (UNASYN) IV 3 g (12/19/17 1053)  . dextrose 5 % and 0.9% NaCl    . feeding supplement  (JEVITY 1.2 CAL)       LOS: 1 day   Time spent: No Charge  Signature  Lala Lund M.D on 12/19/2017 at 11:49 AM  To page go to [email or web address] - password [facility]

## 2017-12-20 ENCOUNTER — Inpatient Hospital Stay (HOSPITAL_COMMUNITY): Payer: Medicare HMO

## 2017-12-20 LAB — CULTURE, RESPIRATORY W GRAM STAIN: Culture: NORMAL

## 2017-12-20 LAB — CBC
HCT: 48 % (ref 39.0–52.0)
Hemoglobin: 14.1 g/dL (ref 13.0–17.0)
MCH: 29.4 pg (ref 26.0–34.0)
MCHC: 29.4 g/dL — ABNORMAL LOW (ref 30.0–36.0)
MCV: 100.2 fL — ABNORMAL HIGH (ref 78.0–100.0)
Platelets: 282 10*3/uL (ref 150–400)
RBC: 4.79 MIL/uL (ref 4.22–5.81)
RDW: 15.1 % (ref 11.5–15.5)
WBC: 13.7 10*3/uL — ABNORMAL HIGH (ref 4.0–10.5)

## 2017-12-20 LAB — BASIC METABOLIC PANEL
Anion gap: 12 (ref 5–15)
BUN: 9 mg/dL (ref 8–23)
CO2: 33 mmol/L — ABNORMAL HIGH (ref 22–32)
Calcium: 9.9 mg/dL (ref 8.9–10.3)
Chloride: 104 mmol/L (ref 98–111)
Creatinine, Ser: 0.91 mg/dL (ref 0.61–1.24)
GFR calc Af Amer: >60 mL/min (ref >60)
GFR calc non Af Amer: >60 mL/min (ref >60)
Glucose, Bld: 106 mg/dL — ABNORMAL HIGH (ref 70–99)
Potassium: 3.8 mmol/L (ref 3.5–5.1)
Sodium: 149 mmol/L — ABNORMAL HIGH (ref 135–145)

## 2017-12-20 LAB — MAGNESIUM: Magnesium: 1.8 mg/dL (ref 1.7–2.4)

## 2017-12-20 IMAGING — DX DG ABD PORTABLE 1V
1 series · 1 of 1 positions shown · non-contrast
Comparison: 11/08/2016.

CLINICAL DATA: Ileus.

EXAM:
PORTABLE ABDOMEN - 1 VIEW

[abdomen]
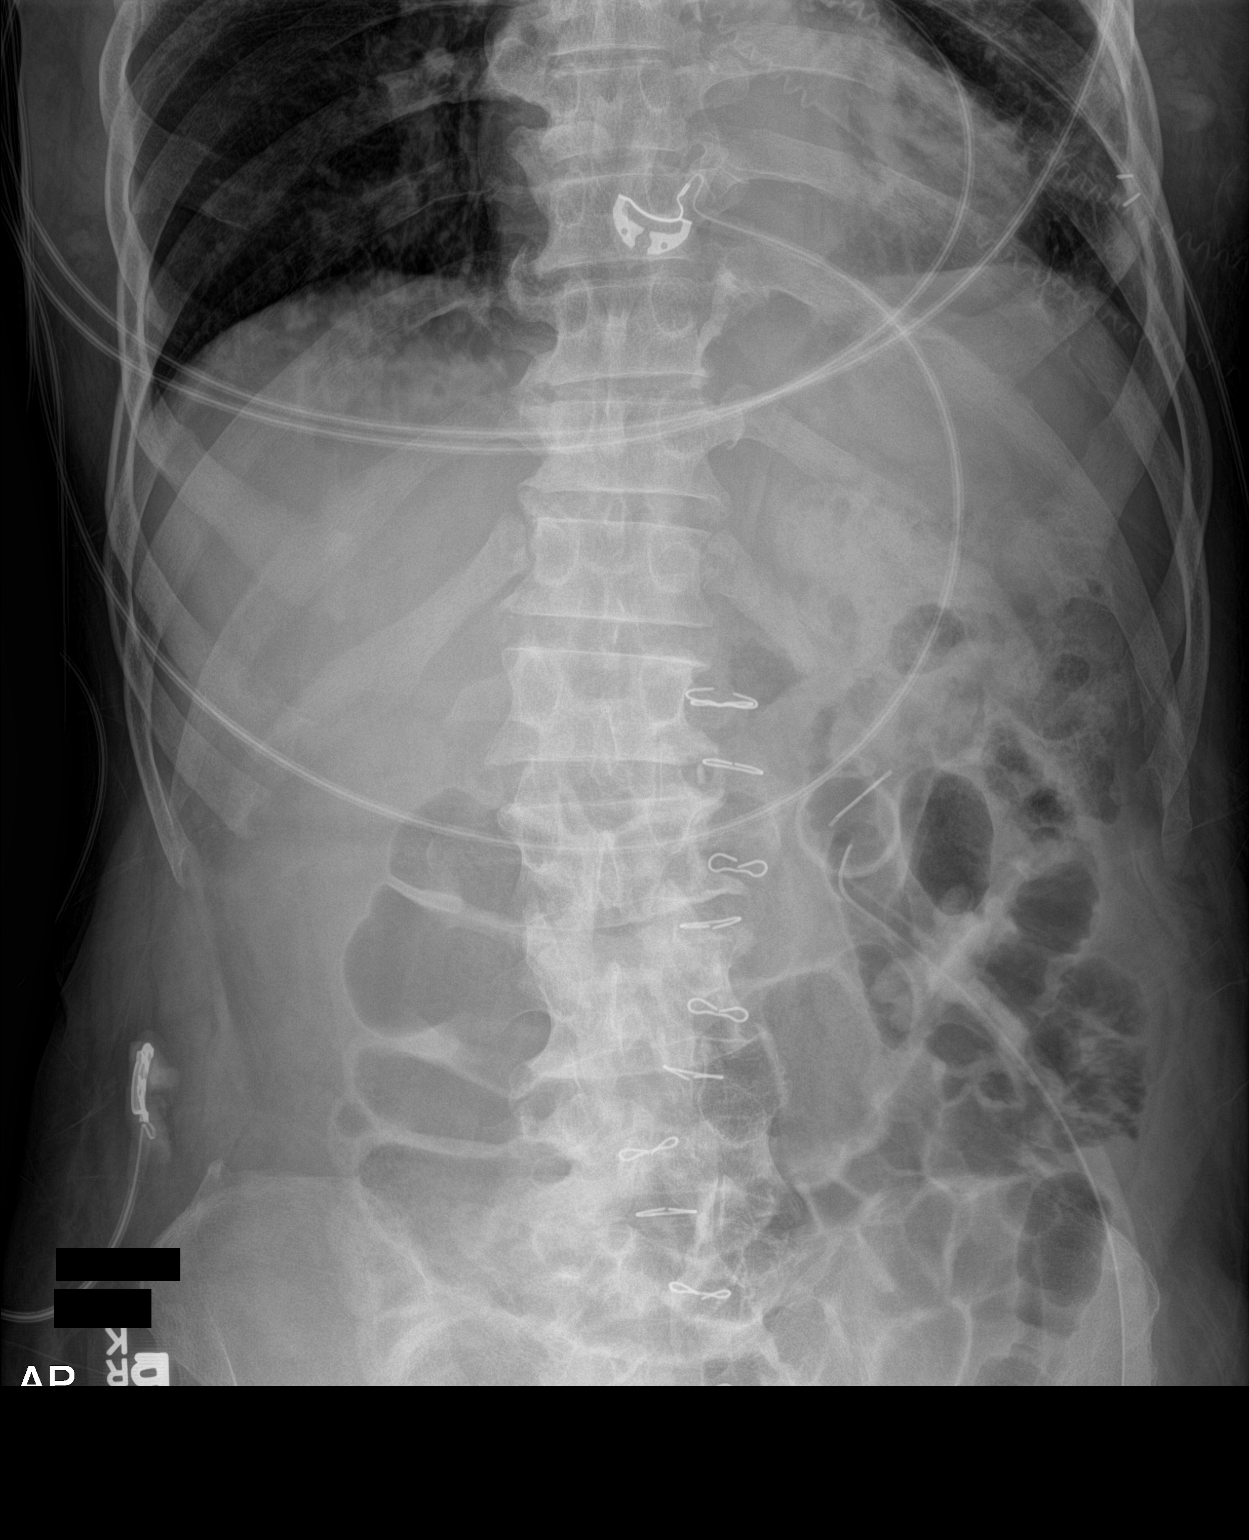

[1 of 1 positions shown; findings below may reference images not displayed]

FINDINGS: Gastrostomy tube noted projected over the stomach. Persistent mildly
distended loops of small and large bowel noted suggesting adynamic
ileus. Similar findings on prior exam. No free air.
IMPRESSION: 1. Gastrostomy tube in stable position.

2. Persistent small large bowel mild distention consistent adynamic
ileus. No interim change .

## 2017-12-20 MED ORDER — METOPROLOL TARTRATE 50 MG PO TABS
50.0000 mg | ORAL_TABLET | Freq: Two times a day (BID) | ORAL | Status: DC
  Administered 2017-12-20 – 2017-12-21 (×3): 50 mg via ORAL
  Filled 2017-12-20 (×3): qty 1

## 2017-12-20 MED ORDER — IPRATROPIUM BROMIDE 0.02 % IN SOLN
0.5000 mg | Freq: Three times a day (TID) | RESPIRATORY_TRACT | Status: DC
  Administered 2017-12-20 – 2017-12-21 (×4): 0.5 mg via RESPIRATORY_TRACT
  Filled 2017-12-20 (×4): qty 2.5

## 2017-12-20 MED ORDER — PEG 3350-KCL-NA BICARB-NACL 420 G PO SOLR
250.0000 mL | ORAL | Status: AC
  Administered 2017-12-20 – 2017-12-21 (×2): 250 mL

## 2017-12-20 MED ORDER — POLYETHYLENE GLYCOL 3350 17 G PO PACK
17.0000 g | PACK | Freq: Two times a day (BID) | ORAL | Status: DC
  Administered 2017-12-21: 17 g
  Filled 2017-12-20: qty 1

## 2017-12-20 MED ORDER — PEG 3350-KCL-NA BICARB-NACL 420 G PO SOLR
250.0000 mL | ORAL | Status: AC
  Administered 2017-12-20 (×2): 250 mL
  Filled 2017-12-20: qty 4000

## 2017-12-20 MED ORDER — LEVALBUTEROL HCL 1.25 MG/0.5ML IN NEBU
1.2500 mg | INHALATION_SOLUTION | Freq: Three times a day (TID) | RESPIRATORY_TRACT | Status: DC
  Administered 2017-12-20 – 2017-12-21 (×4): 1.25 mg via RESPIRATORY_TRACT
  Filled 2017-12-20 (×4): qty 0.5

## 2017-12-20 NOTE — Progress Notes (Signed)
Pharmacy Antibiotic Note  STACI CARVER is a 67 y.o. male admitted on 12/17/2017 with emesis and concern for aspiration pneumonia. Day # 4 Unasyn.  Also received Vancomycin x 1 on 9/3, then stopped. Trach dependent.  Cultures negative to date.  Plan:  Continue Unasyn 3 gm IV q6hrs.  Follow renal function, culture data, progress and length of therapy.  Height: 6' (182.9 cm) Weight: 164 lb 3.9 oz (74.5 kg) IBW/kg (Calculated) : 77.6  Temp (24hrs), Avg:98 F (36.7 C), Min:97.9 F (36.6 C), Max:98.1 F (36.7 C)  Recent Labs  Lab 12/17/17 1949 12/17/17 1952 12/17/17 2012 12/17/17 2145 12/18/17 0245 12/18/17 0815 12/19/17 0459 12/20/17 0811  WBC  --  13.9*  --   --   --  12.1* 12.0* 13.7*  CREATININE 1.03  --   --   --   --  1.25* 1.14 0.91  LATICACIDVEN  --   --  0.99 0.86 0.7  --   --   --     Estimated Creatinine Clearance: 83 mL/min (by C-G formula based on SCr of 0.91 mg/dL).    No Known Allergies  Antimicrobials this admission:  Unasyn 9/2>>  Vanc x 1 on 9/3   Dose adjustments this admission:  n/a  Microbiology results:  9/3 MRSA PCR negative  9/3 tracheal aspirate - normal respiratory flora  9/2 blood x 3 - no growth x 3 days to date  Thank you for allowing pharmacy to be a part of this patient's care.  Arty Baumgartner, [place] Pager: [phone] or phone: [phone] 12/20/2017 1:09 PM

## 2017-12-20 NOTE — Progress Notes (Signed)
[facility]  Patient Status: Active (receiving services up to time of hospitalization)  [facility] is providing the following services: PT and OT  If patient discharges after hours, please call [phone].   Scott Rivera 12/20/2017, 11:02 AM

## 2017-12-20 NOTE — Progress Notes (Signed)
[place] TEAM 1 - Stepdown/ICU TEAM  Scott Rivera  WYO:[id number] DOB: 06-Mar-1951 DOA: 12/17/2017 PCP: Jamesetta Orleans, PA-C    Brief Narrative:  67 y.o. male with a hx of HTN, HLD, stroke > nonverbal s/p trach & PEG tube, CKD 3, GERD, and aspiration pneumonia who presented with nausea and vomiting. He vomited dark-colored material negative on hemaoccult test in ED. Initial O2 sat was 81% on RA at home.   In the ED he was found to have a distended abdomen.  Pt had a large amount of dark brown emesis while on the CT table, w/ oxygen desaturation to 88%. He subsequently expelled several thick mucous plugs and O2 stat comes back to 95%.Chest x-ray showed mild opacity in the right middle lobe.   Significant Events: 9/3 admit   Subjective: Patient in bed, appears to be in no discomfort, limited response to questions due to his underlying CVA, moves left-sided extremities more than right, denies any chest or abdominal pain.  Usually answers in single words.   Assessment & Plan:  Acute hypoxic respiratory failure - RML aspiration PNA - chronic trach-dependence, Due to combination of mucous plugging and aspiration, aspiration due to nausea vomiting caused by bowel impaction, treat underlying cause, continue Unasyn, patient refusing chest PT but encouraged to tolerate if he could, continue supportive measures.  Oxygen via trach mask and nebulizer treatments as needed.     Fecal impaction - recurrent vomiting, CT abdomen/pelvis showed possible esophagitis, and fecal impaction, has initiated aggressive bowel regimen with good results, has had 3-4 bowel movements so far x-ray revealing still moderate stool burden, will give 4 doses of scheduled 250 cc of GoLYTELY on 12/20/2017 and monitor.  Hx of Cerebellar infarct - Cont Statin, ASA and Plavix via PEG tube, he is trach and PEG dependent also largely bed and wheelchair-bound. Moves L>>R side.  Possible esophagitis/ GERD on CT scan.  On twice daily  PPI.  HTN - On home dose Clonidine patch will add Lopressor 50 twice daily for better control.  PCM of moderate degree - Nutrition supplement via PEG.  S/P PEG tube placement, chronic foley  - supportive care.   Overall quality of life does not seem to be very good due to his underlying CVA, bedbound/wheelchair bound status, trach, feeding through PEG tube, recurrent bowel impaction and hospitalization, sister has limited understanding of his health condition.  Still wants aggressive measures.  Will also involve palliative care to address goals of care for the future.    DVT prophylaxis: SCDs/Heparin Code Status: FULL CODE Family Communication: spoke w/ his sister in detail on 12/19/17, explained poor quality of life, limited mobility causing recurrent bowel impaction and subsequent complications, unfortunately sister has limited understanding of the medical issues. Disposition Plan: Medical Tele Consultants: PCCM  Anti-infectives (From admission, onward)   Start     Dose/Rate Route Frequency Ordered Stop   12/18/17 2300  vancomycin (VANCOCIN) 500 mg in sodium chloride 0.9 % 100 mL IVPB  Status:  Discontinued     500 mg 100 mL/hr over 60 Minutes Intravenous Every 12 hours 12/18/17 0703 12/18/17 1208   12/18/17 0800  vancomycin (VANCOCIN) 1,250 mg in sodium chloride 0.9 % 250 mL IVPB     1,250 mg 166.7 mL/hr over 90 Minutes Intravenous  Once 12/18/17 0703 12/18/17 1210   12/18/17 0400  Ampicillin-Sulbactam (UNASYN) 3 g in sodium chloride 0.9 % 100 mL IVPB     3 g 200 mL/hr over 30 Minutes Intravenous Every  6 hours 12/17/17 2101     12/17/17 2100  Ampicillin-Sulbactam (UNASYN) 3 g in sodium chloride 0.9 % 100 mL IVPB     3 g 200 mL/hr over 30 Minutes Intravenous  Once 12/17/17 2013 12/17/17 2330      Objective: Blood pressure (!) 159/78, pulse (!) 119, temperature 98.1 F (36.7 C), temperature source Axillary, resp. rate 18, weight 74.5 kg, SpO2 98 %.  Intake/Output Summary  (Last 24 hours) at 12/20/2017 0947 Last data filed at 12/20/2017 0838 Gross per 24 hour  Intake 692.96 ml  Output 3875 ml  Net -3182.04 ml   Filed Weights   12/17/17 1944 12/20/17 0456  Weight: 74.8 kg 74.5 kg    Examination:  Awake Alert, No new F.N deficits, mobility limited in the R.leg,  Normal affect [place].AT,PERRAL, Trached Supple Neck,No JVD, No cervical lymphadenopathy appriciated.  Symmetrical Chest wall movement, Good air movement bilaterally, CTAB RRR,No Gallops, Rubs or new Murmurs, No Parasternal Heave +ve B.Sounds, Abd Soft, No tenderness, No organomegaly appriciated, No rebound - guarding or rigidity. Peg in place No Cyanosis, Clubbing or edema, No new Rash or bruise, chronic foley      CBC: Recent Labs  Lab 12/17/17 1952 12/18/17 0815 12/19/17 0459 12/20/17 0811  WBC 13.9* 12.1* 12.0* 13.7*  NEUTROABS 10.6*  --   --   --   HGB 15.5 17.1* 14.9 14.1  HCT 49.9 55.6* 50.1 48.0  MCV 96.0 97.0 100.8* 100.2*  PLT 318 281 181 299   Basic Metabolic Panel: Recent Labs  Lab 12/17/17 1949 12/18/17 0815 12/19/17 0459 12/20/17 0811  NA 138 142 145 149*  K 4.7 4.7 5.5* 3.8  CL 99 98 109 104  CO2 31 33* 28 33*  GLUCOSE 88 83 87 106*  BUN 37* 36* 28* 9  CREATININE 1.03 1.25* 1.14 0.91  CALCIUM 10.0 10.2 9.5 9.9  MG  --   --  2.1 1.8  PHOS  --   --  3.7  --    GFR: Estimated Creatinine Clearance: 83 mL/min (by C-G formula based on SCr of 0.91 mg/dL).  Liver Function Tests: Recent Labs  Lab 12/17/17 1949 12/19/17 0459  AST 21 26  ALT 16 14  ALKPHOS 101 78  BILITOT 0.8 1.3*  PROT 8.2* 7.8  ALBUMIN 3.2* 2.8*    Coagulation Profile: Recent Labs  Lab 12/17/17 1952  INR 1.03    HbA1C: Hgb A1c MFr Bld  Date/Time Value Ref Range Status  12/09/2016 02:19 PM 5.7 (H) 4.8 - 5.6 % Final    Comment:    (NOTE) Pre diabetes:          5.7%-6.4% Diabetes:              >6.4% Glycemic control for   <7.0% adults with diabetes   10/21/2016 02:53 AM 5.4 4.8 -  5.6 % Final    Comment:    (NOTE)         Pre-diabetes: 5.7 - 6.4         Diabetes: >6.4         Glycemic control for adults with diabetes: <7.0      Recent Results (from the past 240 hour(s))  Blood culture (routine x 2)     Status: None (Preliminary result)   Collection Time: 12/17/17  9:23 PM  Result Value Ref Range Status   Specimen Description BLOOD RIGHT UPPER WRIST  Final   Special Requests   Final    BOTTLES DRAWN AEROBIC AND  ANAEROBIC Blood Culture adequate volume   Culture   Final    NO GROWTH 3 DAYS Performed at [facility], [place] [address], [place], [place] [address]    Report Status PENDING  Incomplete  Blood culture (routine x 2)     Status: None (Preliminary result)   Collection Time: 12/17/17  9:35 PM  Result Value Ref Range Status   Specimen Description BLOOD RIGHT WRIST  Final   Special Requests   Final    BOTTLES DRAWN AEROBIC ONLY Blood Culture results may not be optimal due to an inadequate volume of blood received in culture bottles   Culture   Final    NO GROWTH 3 DAYS Performed at [facility], [place] [address], [place], [place] [address]    Report Status PENDING  Incomplete  Culture, respiratory     Status: None (Preliminary result)   Collection Time: 12/18/17  3:33 AM  Result Value Ref Range Status   Specimen Description TRACHEAL ASPIRATE  Final   Special Requests NONE  Final   Gram Stain   Final    ABUNDANT WBC PRESENT, PREDOMINANTLY PMN FEW GRAM VARIABLE ROD RARE GRAM POSITIVE COCCI    Culture   Final    CULTURE REINCUBATED FOR BETTER GROWTH Performed at [facility], [place] [address], [place], [place] [address]    Report Status PENDING  Incomplete  MRSA PCR Screening     Status: None   Collection Time: 12/18/17  6:33 AM  Result Value Ref Range Status   MRSA by PCR NEGATIVE NEGATIVE Final    Comment:        The GeneXpert MRSA Assay (FDA approved for NASAL specimens only), is one component of a comprehensive MRSA  colonization surveillance program. It is not intended to diagnose MRSA infection nor to guide or monitor treatment for MRSA infections. Performed at [facility], [place] [address], [place], [place] [address]      Scheduled Meds: . aspirin  81 mg Per Tube Daily  . bisacodyl  10 mg Rectal Daily  . cloNIDine  0.1 mg Transdermal Weekly  . clopidogrel  75 mg Per Tube Daily  . free water  100 mL Per Tube Q8H  . heparin injection (subcutaneous)  5,000 Units Subcutaneous Q8H  . ipratropium  0.5 mg Nebulization TID  . levalbuterol  1.25 mg Nebulization TID  . pantoprazole  40 mg Intravenous Q12H  . [START ON 12/21/2017] polyethylene glycol  17 g Per Tube BID  . polyethylene glycol-electrolytes  250 mL Per Tube Q2H  . pravastatin  20 mg Per Tube Daily  . sorbitol  30 mL Per Tube Daily  . thiamine  100 mg Per Tube Daily   Continuous Infusions: . ampicillin-sulbactam (UNASYN) IV 3 g (12/20/17 0552)  . feeding supplement (JEVITY 1.2 CAL) 1,000 mL (12/19/17 1701)     LOS: 2 days   Time spent: No Charge  Signature  Lala Lund M.D on 12/20/2017 at 9:47 AM  To page go to [email or web address] - password [email or web address]

## 2017-12-21 ENCOUNTER — Inpatient Hospital Stay (HOSPITAL_COMMUNITY): Payer: Medicare HMO

## 2017-12-21 LAB — BASIC METABOLIC PANEL
Anion gap: 9 (ref 5–15)
BUN: 11 mg/dL (ref 8–23)
CO2: 30 mmol/L (ref 22–32)
Calcium: 9.4 mg/dL (ref 8.9–10.3)
Chloride: 105 mmol/L (ref 98–111)
Creatinine, Ser: 0.77 mg/dL (ref 0.61–1.24)
GFR calc Af Amer: >60 mL/min (ref >60)
GFR calc non Af Amer: >60 mL/min (ref >60)
Glucose, Bld: 93 mg/dL (ref 70–99)
Potassium: 4.3 mmol/L (ref 3.5–5.1)
Sodium: 144 mmol/L (ref 135–145)

## 2017-12-21 LAB — CBC
HCT: 48.3 % (ref 39.0–52.0)
Hemoglobin: 14.4 g/dL (ref 13.0–17.0)
MCH: 29.6 pg (ref 26.0–34.0)
MCHC: 29.8 g/dL — ABNORMAL LOW (ref 30.0–36.0)
MCV: 99.4 fL (ref 78.0–100.0)
Platelets: 265 10*3/uL (ref 150–400)
RBC: 4.86 MIL/uL (ref 4.22–5.81)
RDW: 14.8 % (ref 11.5–15.5)
WBC: 9 10*3/uL (ref 4.0–10.5)

## 2017-12-21 LAB — MAGNESIUM: Magnesium: 1.9 mg/dL (ref 1.7–2.4)

## 2017-12-21 LAB — PHOSPHORUS: Phosphorus: 2.2 mg/dL — ABNORMAL LOW (ref 2.5–4.6)

## 2017-12-21 MED ORDER — AMOXICILLIN-POT CLAVULANATE 875-125 MG PO TABS: 1.0000 | ORAL_TABLET | Freq: Two times a day (BID) | ORAL | 0 refills | Status: AC

## 2017-12-21 MED ORDER — POLYETHYLENE GLYCOL 3350 17 G PO PACK: 17.0000 g | PACK | Freq: Two times a day (BID) | ORAL | 0 refills | Status: AC

## 2017-12-21 MED ORDER — PANTOPRAZOLE SODIUM 40 MG PO TBEC: 40.0000 mg | DELAYED_RELEASE_TABLET | Freq: Every day | ORAL | 0 refills | Status: DC

## 2017-12-21 MED ORDER — BISACODYL 10 MG RE SUPP: 10.0000 mg | Freq: Every day | RECTAL | 0 refills | Status: DC

## 2017-12-21 MED ORDER — FUROSEMIDE 10 MG/ML IJ SOLN
40.0000 mg | Freq: Once | INTRAMUSCULAR | Status: AC
  Administered 2017-12-21: 40 mg via INTRAVENOUS
  Filled 2017-12-21: qty 4

## 2017-12-21 MED ORDER — METOPROLOL TARTRATE 50 MG PO TABS: 50.0000 mg | ORAL_TABLET | Freq: Two times a day (BID) | ORAL | 0 refills | Status: AC

## 2017-12-21 NOTE — Discharge Instructions (Signed)
Follow with Primary MD Jamesetta Orleans, PA-C in 7 days   Get CBC, CMP, checked  by Primary MD or SNF MD in 5-7 days   Activity: As tolerated with Full fall precautions use walker/cane & assistance as needed  Disposition Home   Diet:   All diet and medications via PEG tube n.p.o. by mouth.  Special Instructions: If you have smoked or chewed Tobacco  in the last 2 yrs please stop smoking, stop any regular Alcohol  and or any Recreational drug use.  On your next visit with your primary care physician please Get Medicines reviewed and adjusted.  Please request your Prim.MD to go over all Hospital Tests and Procedure/Radiological results at the follow up, please get all Hospital records sent to your Prim MD by signing hospital release before you go home.  If you experience worsening of your admission symptoms, develop shortness of breath, life threatening emergency, suicidal or homicidal thoughts you must seek medical attention immediately by calling 911 or calling your MD immediately  if symptoms less severe.  You Must read complete instructions/literature along with all the possible adverse reactions/side effects for all the Medicines you take and that have been prescribed to you. Take any new Medicines after you have completely understood and accpet all the possible adverse reactions/side effects.   Do not drive, operate heavy machinery, perform activities at heights, swimming or participation in water activities or provide baby sitting services if your were admitted for syncope or siezures until you have seen by Primary MD or a Neurologist and advised to do so again.

## 2017-12-21 NOTE — Progress Notes (Signed)
12/21/2017 1320 Pt discharged to home via [facility]. [place]

## 2017-12-21 NOTE — Discharge Summary (Signed)
Scott Rivera SKA:[id number] DOB: March 21, 1951 DOA: 12/17/2017  PCP: Jamesetta Orleans, PA-C  Admit date: 12/17/2017  Discharge date: 12/21/2017  Admitted From: Home   Disposition:  Home   Recommendations for Outpatient Follow-up:   Follow up with PCP in 1-2 weeks  PCP Please obtain BMP/CBC, 2 view CXR in 1week,  (see Discharge instructions)   PCP Please follow up on the following pending results:    Home Health: PT, RN, Speech   Equipment/Devices: None  Consultations: PCCM, Pall Care ( sister resistant to talk) Discharge Condition: Fair   CODE STATUS: Full   Diet Recommendation: All diet and medications via PEG tube, n.p.o. by mouth.    Chief Complaint  Patient presents with  . Emesis     Brief history of present illness from the day of admission and additional interim summary     67 y.o.malewith a hx ofHTN, HLD, stroke> nonverbal s/p trach & PEG tube, CKD 3, GERD, and aspiration pneumonia who presented with nausea and vomiting. Hevomited dark-colored material negative on hemaoccult test in ED.InitialO2sat was81% on RA at home.  In the ED he was found to have a distended abdomen.  Pt had alarge amount of dark brown emesis whileonthe CT table, w/ oxygen desaturation to 88%. He subsequently expelled several thick mucous plugs and O2 stat comes back to95%.Chest x-ray showed mild opacity in the right middle lobe.                                                                  Hospital Course    Acute hypoxic respiratory failure - RML aspiration PNA- chronic trach-dependence, Due to combination of mucous plugging and aspiration, aspiration due to nausea vomiting caused by bowel impaction, treated underlying cause regimen to good effect, he was initially placed on empiric Unasyn and now transition to  oral Augmentin through his PEG tube for the next 5 days, clinically pneumonia has resolved, he is close to his baseline.  He has a chronic trach, has PRN nebulizer treatments at home, will be discharged on oral antibiotic with home PT RN and speech therapist to follow.  He will follow with his PCP in a week.    Fecal impaction - recurrent problem, he is on Robinul at home which worsens his constipation in conjunction with his lack of activity and bedbound status, he had vomiting aspiration upon admission, CT abdomen/pelvis showed possible esophagitis, and fecal impaction, has initiated aggressive bowel regimen with good results, has had 8 large bowel movements so far no further nausea, DC with PPI, all regimen with close outpatient PCP follow-up and monitoring.  May benefit from one-time outpatient GI follow-up as well.  Hx ofCerebellar infarct - Cont Statin, ASA and Plavix via PEG tube, he is trach and PEG dependent also largely bed and  wheelchair-bound. Moves L>>R side.  Possible esophagitis/ GERD on CT scan.  On PPI.  HTN - On home dose Clonidine patch will add Lopressor 50 twice daily for better control.  PCM of moderate degree - Nutrition supplement via PEG.  S/P PEG tube placement, chronic foley  - supportive care.   Overall quality of life does not seem to be very good due to his underlying CVA, bedbound/wheelchair bound status, trach, feeding through PEG tube, recurrent bowel impaction and hospitalization, sister has limited understanding of his health condition.  Still wants aggressive measures. We requested palliative care to get involved as well however sister was resistant talking to them she informed me that she has had discussions with palliative care in the past and for now wants to pursue everything.    DVT prophylaxis: SCDs/Heparin Code Status: FULL CODE Family Communication: spoke w/ his sister in detail on 12/19/17, explained poor quality of life, limited mobility  causing recurrent bowel impaction and subsequent complications, unfortunately sister has limited understanding of the medical issues. 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 by mouth.  Special Instructions: If you have smoked or chewed Tobacco  in the last 2 yrs please stop smoking, stop any regular Alcohol  and or any Recreational drug use.  On your next visit with your primary care physician please Get Medicines reviewed and adjusted.  Please request your Prim.MD to go over all Hospital Tests and Procedure/Radiological results at the follow up, please get all Hospital records sent to your Prim MD by signing hospital release before you go home.  If you experience worsening of your admission symptoms, develop shortness of breath, life threatening emergency, suicidal or homicidal thoughts you must seek medical attention immediately by calling 911 or calling your MD immediately  if symptoms less severe.  You Must read complete instructions/literature along with all the  possible adverse reactions/side effects for all the Medicines you take and that have been prescribed to you. Take any new Medicines after you have completely understood and accpet all the possible adverse reactions/side effects.   Do not drive, operate heavy machinery, perform activities at heights, swimming or participation in water activities or provide baby sitting services if your were admitted for syncope or siezures until you have seen by Primary MD or a Neurologist and advised to do so again.   Increase activity slowly   Complete by:  As directed       Discharge Medications   Allergies as of 12/21/2017  No Known Allergies     Medication List    STOP taking these medications   glycopyrrolate 1 MG tablet Commonly known as:  ROBINUL     TAKE these medications   acetaminophen 500 MG tablet Commonly known as:  TYLENOL Place 1,000 mg into feeding tube 3 (three) times daily.   amLODipine 10 MG tablet Commonly known as:  NORVASC Place 1 tablet (10 mg total) into feeding tube daily.   amoxicillin-clavulanate 875-125 MG tablet Commonly known as:  AUGMENTIN Take 1 tablet by mouth every 12 (twelve) hours for 5 days.   aspirin 81 MG chewable tablet Place 1 tablet (81 mg total) into feeding tube daily.   atropine 1 % ophthalmic solution Place 2 drops under the tongue every 8 (eight) hours as needed (excessive secretion). What changed:  when to take this   bisacodyl 10 MG suppository Commonly known as:  DULCOLAX Place 1 suppository (10 mg total) rectally daily. Start taking on:  12/22/2017   cloNIDine 0.1 mg/24hr patch Commonly known as:  CATAPRES - Dosed in mg/24 hr Place 1 patch (0.1 mg total) onto the skin once a week. What changed:  when to take this   clopidogrel 75 MG tablet Commonly known as:  PLAVIX Place 1 tablet (75 mg total) into feeding tube daily.   feeding supplement (JEVITY 1.2 CAL) Liqd Place 1,000 mLs into feeding tube continuous.   feeding supplement  (PRO-STAT SUGAR FREE 64) Liqd Take 30 mLs by mouth at bedtime.   free water Soln Place 100 mLs into feeding tube daily. What changed:  when to take this   gabapentin 100 MG capsule Commonly known as:  NEURONTIN 100 mg See admin instructions. Open 1 capsule (100 mg) and administer contents via feeding tube three times daily   ipratropium-albuterol 0.5-2.5 (3) MG/3ML Soln Commonly known as:  DUONEB Take 3 mLs by nebulization every 4 (four) hours as needed. What changed:  reasons to take this   metoprolol tartrate 50 MG tablet Commonly known as:  LOPRESSOR Take 1 tablet (50 mg total) by mouth 2 (two) times daily.   montelukast 10 MG tablet Commonly known as:  SINGULAIR Place 10 mg into feeding tube at bedtime.   mouth rinse Liqd solution 10 mLs by Mouth Rinse route 4 (four) times daily.   pantoprazole 40 MG tablet Commonly known as:  PROTONIX Take 1 tablet (40 mg total) by mouth daily.   polyethylene glycol packet Commonly known as:  MIRALAX / GLYCOLAX Place 17 g into feeding tube 2 (two) times daily.   pravastatin 20 MG tablet Commonly known as:  PRAVACHOL Place 1 tablet (20 mg total) into feeding tube daily at 6 PM. What changed:  when to take this   ranitidine 150 MG/10ML syrup Commonly known as:  ZANTAC Place 10 mLs (150 mg total) into feeding tube daily.   senna 8.6 MG Tabs tablet Commonly known as:  SENOKOT Take 1 tablet by mouth daily.   thiamine 100 MG tablet Place 1 tablet (100 mg total) into feeding tube daily.       Follow-up Information    Jamesetta Orleans, PA-C. Schedule an appointment as soon as possible for a visit in 1 week(s).   Specialty:  Internal Medicine Contact information: [place] [address] [phone]        Doran Stabler, MD. Schedule an appointment as soon as possible for a visit in 1 week(s).   Specialty:  Gastroenterology Why:  Esophagitis, recurrent  constipation Contact information: [place] [address] [phone]           Major procedures and Radiology Reports - PLEASE review detailed and final reports thoroughly  -        Ct Pelvis W Contrast  Result Date: 11/21/2017 CLINICAL DATA:  67 year old male with a history of possible anal fissure or fistula EXAM: CT PELVIS WITH CONTRAST TECHNIQUE: Multidetector CT imaging of the pelvis was performed using the standard protocol following the bolus administration of intravenous contrast. CONTRAST:  176mL OMNIPAQUE IOHEXOL 300 MG/ML SOLN, 63mL ISOVUE-300 IOPAMIDOL (ISOVUE-300) INJECTION 61% COMPARISON:  06/20/2017 FINDINGS: Urinary Tract: Balloon retention urinary catheter within urinary bladder which is decompressed. Bowel: Compared to the prior CT scan of 06/20/2017 there has been significant decreased volume of formed stool within the rectum. No distention of the visualized colon or small bowel. Colonic diverticula are present without associated inflammation. The bilateral ischial rectal fossa are unremarkable with no focal inflammation or fluid. Unremarkable appearance of the perirectal fat and presacral fat. No evidence of perforation. Normal appendix is visualized. Vascular/Lymphatic: Atherosclerotic calcifications of the distal aorta and bilateral iliac arteries. Calcifications of the bilateral common femoral arteries. Common femoral arteries remain patent. Reproductive:  Unremarkable appearance of the prostate. Other: Surgical changes of the midline abdomen. Bilateral fat containing inguinal hernia. Musculoskeletal: Osteopenia. No displaced fracture. AVN of the left femoral head. Advanced degenerative changes of the right femoral head IMPRESSION: No acute CT finding. Significant reduction in formed stool of the visualized colon, with no evidence of bowel perforation, obstruction, perirectal abscess. Aortic Atherosclerosis (ICD10-I70.0). Electronically Signed   By: Corrie Mckusick D.O.   On: 11/21/2017 20:06   Ct  Abdomen Pelvis W Contrast  Result Date: 12/18/2017 CLINICAL DATA:  Dark emesis x1 at home as well as on the CT table. Nonverbal. EXAM: CT ABDOMEN AND PELVIS WITH CONTRAST TECHNIQUE: Multidetector CT imaging of the abdomen and pelvis was performed using the standard protocol following bolus administration of intravenous contrast. CONTRAST:  156mL ISOVUE-300 IOPAMIDOL (ISOVUE-300) INJECTION 61% COMPARISON:  06/20/2017 CT FINDINGS: Lower chest: Top-normal size heart. Thick-walled distal esophagus with small hiatal hernia question esophagitis. Bibasilar peribronchial thickening with subsegmental atelectasis at each lung base. Hepatobiliary: Probable subcapsular cyst in the right hepatic lobe measuring 1 cm. No biliary dilatation. Physiologic distention of the gallbladder without stones. Pancreas: No pancreatic mass or pathologic ductal dilatation. No inflammation. Spleen: Normal Adrenals/Urinary Tract: Normal bilateral adrenal glands. Markedly atrophic left kidney with compensatory hypertrophy of the right kidney. No obstructive uropathy or mass. The urinary bladder is unremarkable and decompressed by Foley catheter. Stomach/Bowel: Significant stool retention with stool ball in the rectum measuring 11 x 8.9 x 10.9 cm consistent with fecal impaction. No small bowel dilatation or obstruction. No inflammation. Percutaneous gastrostomy tube is in place. Vascular/Lymphatic: Aortic atherosclerosis without aneurysm. No adenopathy. Reproductive: Prostate is unremarkable. Other: No free air nor free fluid. Musculoskeletal: Sagittal midline suture material. Mild dextroconvex curvature of the mid lumbar spine with multilevel degenerative disc and facet arthritis. 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 No collapse. IMPRESSION: 1. Significant stool burden throughout the colon with fecal impaction as above. 2. Thick-walled distal esophagus query esophagitis. 3. Gastrostomy tube within the distal stomach. Foley  decompressed urinary bladder. 4. Aortic atherosclerosis. 5. Compensatory hypertrophy of the right kidney with atrophic left kidney as before. Electronically Signed   By: Ashley Royalty M.D.   On: 12/18/2017 00:53   Dg Chest Port 1  View  Result Date: 12/20/2017 CLINICAL DATA:  Shortness of breath EXAM: PORTABLE CHEST 1 VIEW COMPARISON:  Portable exam 0602 hours compared to 12/18/2017 FINDINGS: Tracheostomy tube stable. Upper normal heart size. Atherosclerotic calcification aorta. Slightly prominent RIGHT paratracheal soft tissues unchanged. Peribronchial thickening with increased BILATERAL pulmonary infiltrates. No pleural effusion or pneumothorax. Question avascular necrosis of the LEFT humeral head. IMPRESSION: Slightly increased pulmonary infiltrates favoring infection. 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 Electronically Signed   By: Lavonia Dana M.D.   On: 12/20/2017 10:26   Dg Chest Port 1 View  Result Date: 12/18/2017 CLINICAL DATA:  67 year old male with aspiration of gastric content. EXAM: PORTABLE CHEST 1 VIEW COMPARISON:  Chest radiograph dated 12/17/2017 FINDINGS: Tracheostomy above the carina in similar position. Interval progression of the densities at the right lung base since the prior radiograph. Although this may be partly related to atelectatic changes and vascular crowding, interval progression is concerning for developing infiltrate. Clinical correlation and follow-up recommended. Minimal left lung base atelectatic changes. No lobar consolidation. There is no pleural effusion or pneumothorax. The cardiac silhouette is within normal limits. No acute osseous pathology. Degenerative changes of the right shoulder and acromion. IMPRESSION: Interval progression of right lung base/right infrahilar densities since the prior radiograph. Clinical correlation follow-up recommended. Electronically Signed   By: Anner Crete M.D.   On: 12/18/2017 04:40   Dg Chest Portable 1  View  Result Date: 12/17/2017 CLINICAL DATA:  Emesis. EXAM: PORTABLE CHEST 1 VIEW COMPARISON:  November 20, 2017 FINDINGS: That the tracheostomy tube is in good position. No pneumothorax. The left-sided infiltrate seen previously has resolved. Minimal atelectasis in the left base. There is mild opacity in the medial right lung base, slightly more prominent the interval. The cardiomediastinal silhouette is stable. No other interval changes. IMPRESSION: 1. Mild opacity in the medial right lung base is mildly more prominent compared to the previous study. This could represent a combination of vascular crowding and atelectasis. A developing infiltrate is not excluded. Recommend clinical correlation and follow-up to resolution. Electronically Signed   By: Dorise Bullion III M.D   On: 12/17/2017 20:03   Dg Abd Portable 1v  Result Date: 12/21/2017 CLINICAL DATA:  Constipation. EXAM: PORTABLE ABDOMEN - 1 VIEW COMPARISON:  12/20/2017 FINDINGS: There is no bowel dilation to suggest obstruction or significant adynamic ileus. Rectal and colonic stool burden appears mildly improved from the previous exam. Left mid abdomen gastrostomy tube is stable. IMPRESSION: 1. Mild decrease in the amount of colonic and rectal stool since the previous day's study. 2. No acute findings.  No bowel obstruction. Electronically Signed   By: Lajean Manes M.D.   On: 12/21/2017 09:58   Dg Abd Portable 1v  Result Date: 12/20/2017 CLINICAL DATA:  Constipation, history stage III chronic kidney disease, hypertension EXAM: PORTABLE ABDOMEN - 1 VIEW COMPARISON:  Portable exam 0607 hours compared to 12/18/2017 FINDINGS: Significantly increased stool in rectum. Remaining bowel gas pattern normal. Question G-tube projecting over LEFT mid abdomen. Degenerative disc disease changes and scoliosis of the thoracolumbar spine. Degenerative changes of both hips with question prior avascular necrosis of the femoral heads. No urine tract calcification.  IMPRESSION: Significantly increased stool in rectum. Question avascular necrosis of the femoral heads with degenerative changes of both hip joints greater on RIGHT. Electronically Signed   By: Lavonia Dana M.D.   On: 12/20/2017 10:27   Dg Abd Portable 1v  Result Date: 12/18/2017 CLINICAL DATA:  Nasogastric tube placement. EXAM: PORTABLE ABDOMEN -  1 VIEW COMPARISON:  CT abdomen and pelvis December 18, 2017 FINDINGS: Nasogastric tube tip projects in proximal stomach, side port above the GE junction region. Known gastrostomy tube difficult to identify. Moderate amount of retained large bowel stool. IMPRESSION: Nasogastric tube tip projects in proximal stomach, side port above the GE junction region. Electronically Signed   By: Elon Alas M.D.   On: 12/18/2017 05:42    Micro Results     Recent Results (from the past 240 hour(s))  Blood culture (routine x 2)     Status: None (Preliminary result)   Collection Time: 12/17/17  9:23 PM  Result Value Ref Range Status   Specimen Description BLOOD RIGHT UPPER WRIST  Final   Special Requests   Final    BOTTLES DRAWN AEROBIC AND ANAEROBIC Blood Culture adequate volume   Culture   Final    NO GROWTH 3 DAYS Performed at [facility], [place] [address], [place], [place] [address]    Report Status PENDING  Incomplete  Blood culture (routine x 2)     Status: None (Preliminary result)   Collection Time: 12/17/17  9:35 PM  Result Value Ref Range Status   Specimen Description BLOOD RIGHT WRIST  Final   Special Requests   Final    BOTTLES DRAWN AEROBIC ONLY Blood Culture results may not be optimal due to an inadequate volume of blood received in culture bottles   Culture   Final    NO GROWTH 3 DAYS Performed at [facility], [place] [address] [place], [place] [address]    Report Status PENDING  Incomplete  Culture, respiratory     Status: None   Collection Time: 12/18/17  3:33 AM  Result Value Ref Range Status   Specimen Description TRACHEAL  ASPIRATE  Final   Special Requests NONE  Final   Gram Stain   Final    ABUNDANT WBC PRESENT, PREDOMINANTLY PMN FEW GRAM VARIABLE ROD RARE GRAM POSITIVE COCCI    Culture   Final    Consistent with normal respiratory flora. Performed at [facility], [place] [address] [place], [place] [address]    Report Status 12/20/2017 FINAL  Final  MRSA PCR Screening     Status: None   Collection Time: 12/18/17  6:33 AM  Result Value Ref Range Status   MRSA by PCR NEGATIVE NEGATIVE Final    Comment:        The GeneXpert MRSA Assay (FDA approved for NASAL specimens only), is one component of a comprehensive MRSA colonization surveillance program. It is not intended to diagnose MRSA infection nor to guide or monitor treatment for MRSA infections. Performed at [facility], [place] [address] [place], [place] [address]     Today   Subjective    Scott Rivera today has no headache,no chest abdominal pain,no new weakness tingling or numbness, feels much better wants to go home today.     Objective   Blood pressure (!) 155/97, pulse 96, temperature (!) 97.5 F (36.4 C), temperature source Axillary, resp. rate 18, height 6' (1.829 m), weight 74.1 kg, SpO2 97 %.   Intake/Output Summary (Last 24 hours) at 12/21/2017 1112 Last data filed at 12/21/2017 0700 Gross per 24 hour  Intake 0 ml  Output 1750 ml  Net -1750 ml    Exam  Awake Alert, No new F.N deficits, mobility limited in the R.leg,  Normal affect [place].AT,PERRAL, Trached Supple Neck,No JVD, No cervical lymphadenopathy appriciated.  Symmetrical  Chest wall movement, Good air movement bilaterally, CTAB RRR,No Gallops, Rubs or new Murmurs, No Parasternal Heave +ve B.Sounds, Abd Soft, No tenderness, No organomegaly appriciated, No rebound - guarding or rigidity. Peg in place No Cyanosis, Clubbing or edema, No new Rash or bruise, chronic foley    Data Review   CBC w Diff:  Lab Results  Component Value Date   WBC 9.0  12/21/2017   HGB 14.4 12/21/2017   HCT 48.3 12/21/2017   PLT 265 12/21/2017   LYMPHOPCT 8 12/17/2017   BANDSPCT 7 11/18/2017   MONOPCT 11 12/17/2017   EOSPCT 3 12/17/2017   BASOPCT 1 12/17/2017    CMP:  Lab Results  Component Value Date   NA 144 12/21/2017   K 4.3 12/21/2017   CL 105 12/21/2017   CO2 30 12/21/2017   BUN 11 12/21/2017   CREATININE 0.77 12/21/2017   PROT 7.8 12/19/2017   ALBUMIN 2.8 (L) 12/19/2017   BILITOT 1.3 (H) 12/19/2017   ALKPHOS 78 12/19/2017   AST 26 12/19/2017   ALT 14 12/19/2017  .   Total Time in preparing paper work, data evaluation and todays exam - 60 minutes  Lala Lund M.D on 12/21/2017 at 11:12 AM  [facility]   Office  [phone]

## 2017-12-21 NOTE — Progress Notes (Signed)
[facility] called for transport home, bedside RN aware, paperwork on shadow chart.

## 2017-12-21 NOTE — Care Management Note (Addendum)
Case Management Note Marvetta Gibbons RN, BSN Unit 4E- RN Care Coordinator  [phone]  Patient Details  Name: Scott Rivera MRN: [id number] Date of Birth: April 08, 1951  Subjective/Objective:   Pt admitted with N/V- asp. pna - pt with hx of CVA- s/p trach and peg               Action/Plan: PTA pt lived at home with sister who is legal guardian. -pt was active with [facility] for HHPT/OT, orders have been placed to resume [place] services- CM has notified Butch Penny with [facility] of discharge today and [place] resumption of care needs/orders. Pt to transport home vis [facility], paperwork has been placed on shadow chart for transport.   Expected Discharge Date:  12/21/17               Expected Discharge Plan:  [place]  In-House Referral:  NA  Discharge planning Services  CM Consult  Post Acute Care Choice:  Home Health, Resumption of Svcs/PTA Provider Choice offered to:  sibling  DME Arranged:    DME Agency:     HH Arranged:  PT, OT, Speech Therapy, RN [place] Agency:  [place]  Status of Service:  Completed, signed off  If discussed at [place] of Stay Meetings, dates discussed:    Discharge Disposition: home//home health   Additional Comments:  Dawayne Patricia, RN 12/21/2017, 11:52 AM

## 2017-12-22 LAB — CULTURE, BLOOD (ROUTINE X 2)
Culture: NO GROWTH
Culture: NO GROWTH
Special Requests: ADEQUATE

## 2017-12-23 IMAGING — DX DG CHEST 1V PORT
1 series · 1 of 1 positions shown · non-contrast
Comparison: Portable chest x-ray November 07, 2016.

CLINICAL DATA: Aspiration pneumonia, CVA, acute on chronic renal
failure, current smoker.

EXAM:
PORTABLE CHEST 1 VIEW

[chest ap]
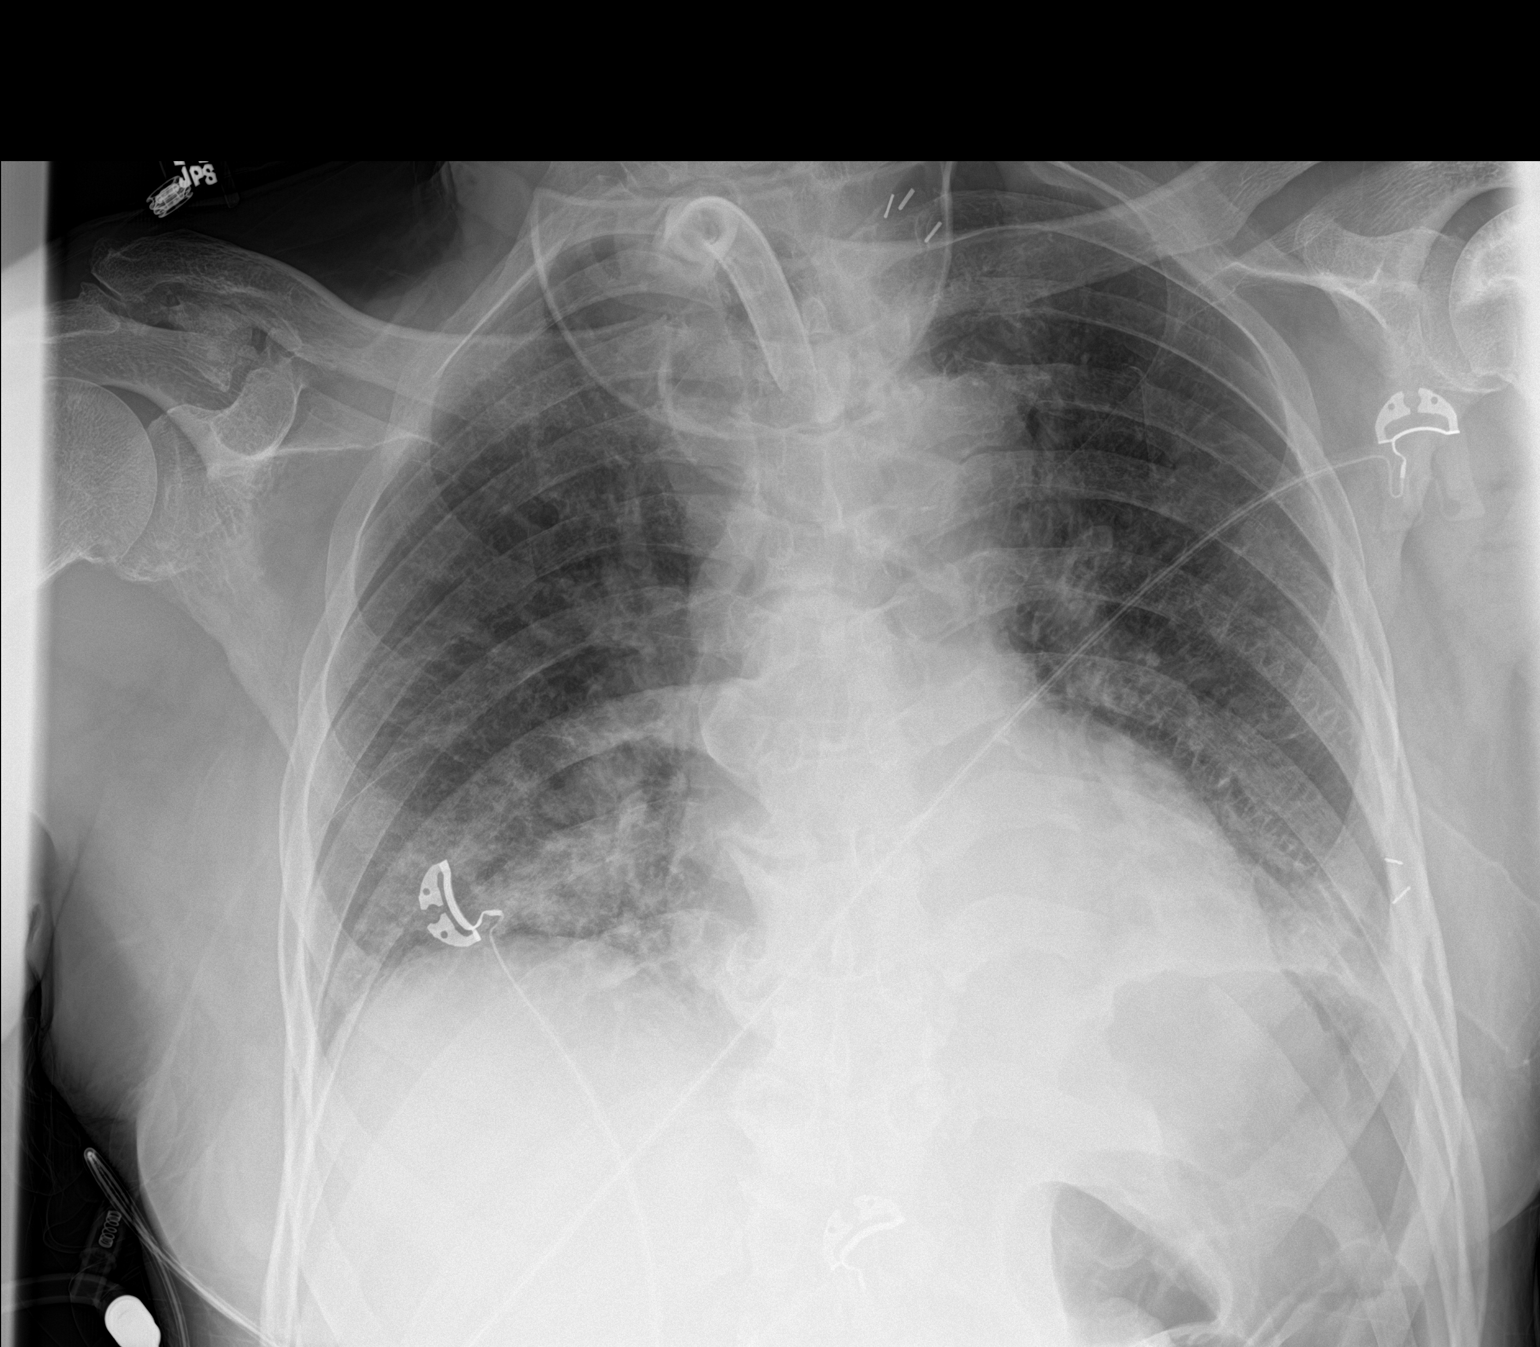

[1 of 1 positions shown; findings below may reference images not displayed]

FINDINGS: The lungs are adequately inflated. The interstitial markings remain
mildly increased greatest at both bases. There is partial
obscuration of the left hemidiaphragm. The heart is normal in size.
The central pulmonary vascularity is prominent but there is no
definite cephalization. The tracheostomy tube tip projects at the
inferior margin of the clavicular heads.
IMPRESSION: Bibasilar atelectasis or pneumonia, stable. The left hemidiaphragm
is slightly better demonstrated today.

Mild central pulmonary vascular congestion, improved.

## 2017-12-26 IMAGING — DX DG CHEST 1V PORT
1 series · 1 of 1 positions shown · non-contrast
Comparison: Multiple prior, most recent 11/13/2016, 11/07/2016

CLINICAL DATA: 66-year-old male with a history of pneumonia

EXAM:
PORTABLE CHEST 1 VIEW

[chest ap]
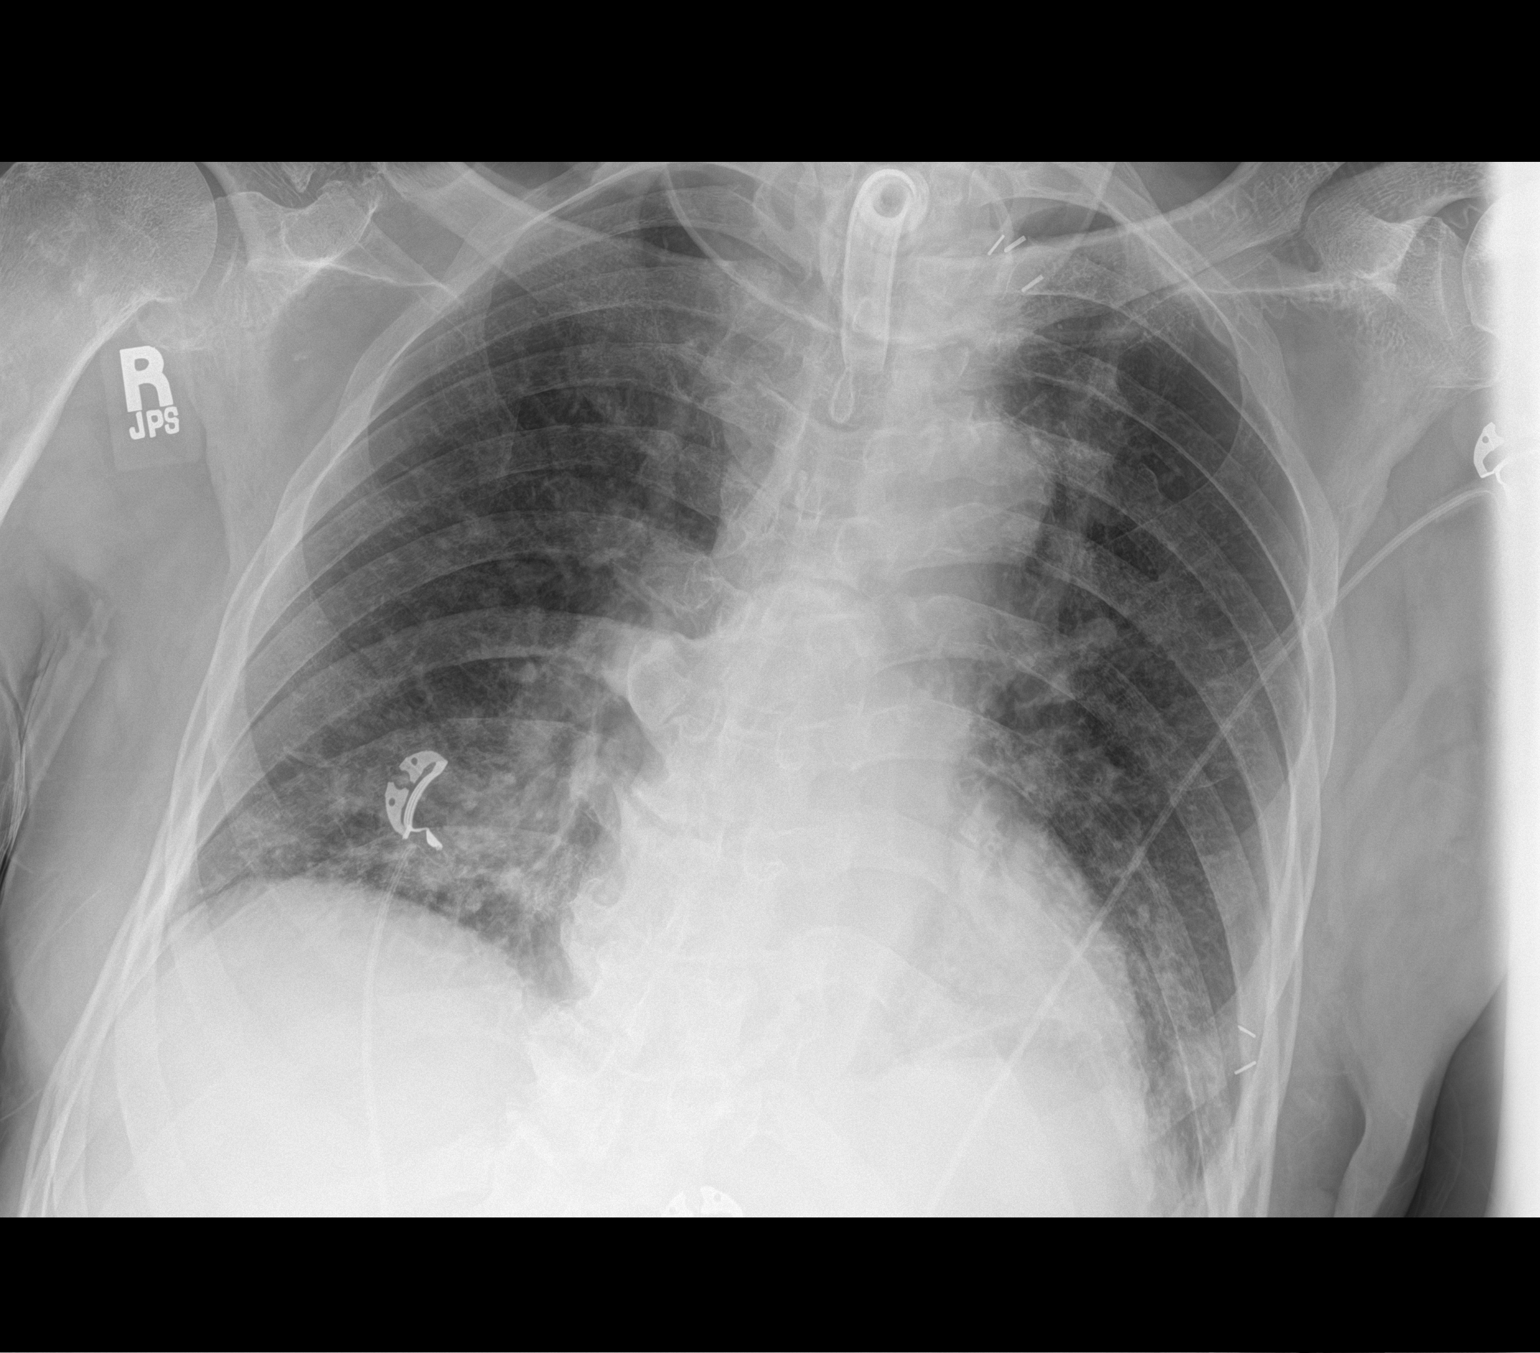

[1 of 1 positions shown; findings below may reference images not displayed]

FINDINGS: Cardiomediastinal silhouette unchanged in size and contour.

No evidence of central vascular congestion.

Patchy opacities in the bilateral lower lungs, along the right heart
border on the right and partially obscuring the left hemidiaphragm
on the left. Overall, similar appearance to prior. No pneumothorax.

Unchanged tracheostomy tube.

No large pleural effusion identified.

No displaced fracture
IMPRESSION: Similar appearance of the lungs with basilar airspace opacities,
potentially combination of persisting infection and atelectasis.

Unchanged tracheostomy tube

## 2017-12-30 IMAGING — DX DG ABD PORTABLE 1V
1 series · 1 of 1 positions shown · non-contrast
Comparison: 11/10/2016 abdominal radiograph

CLINICAL DATA: Ileus.

EXAM:
PORTABLE ABDOMEN - 1 VIEW

[abdomen kub]
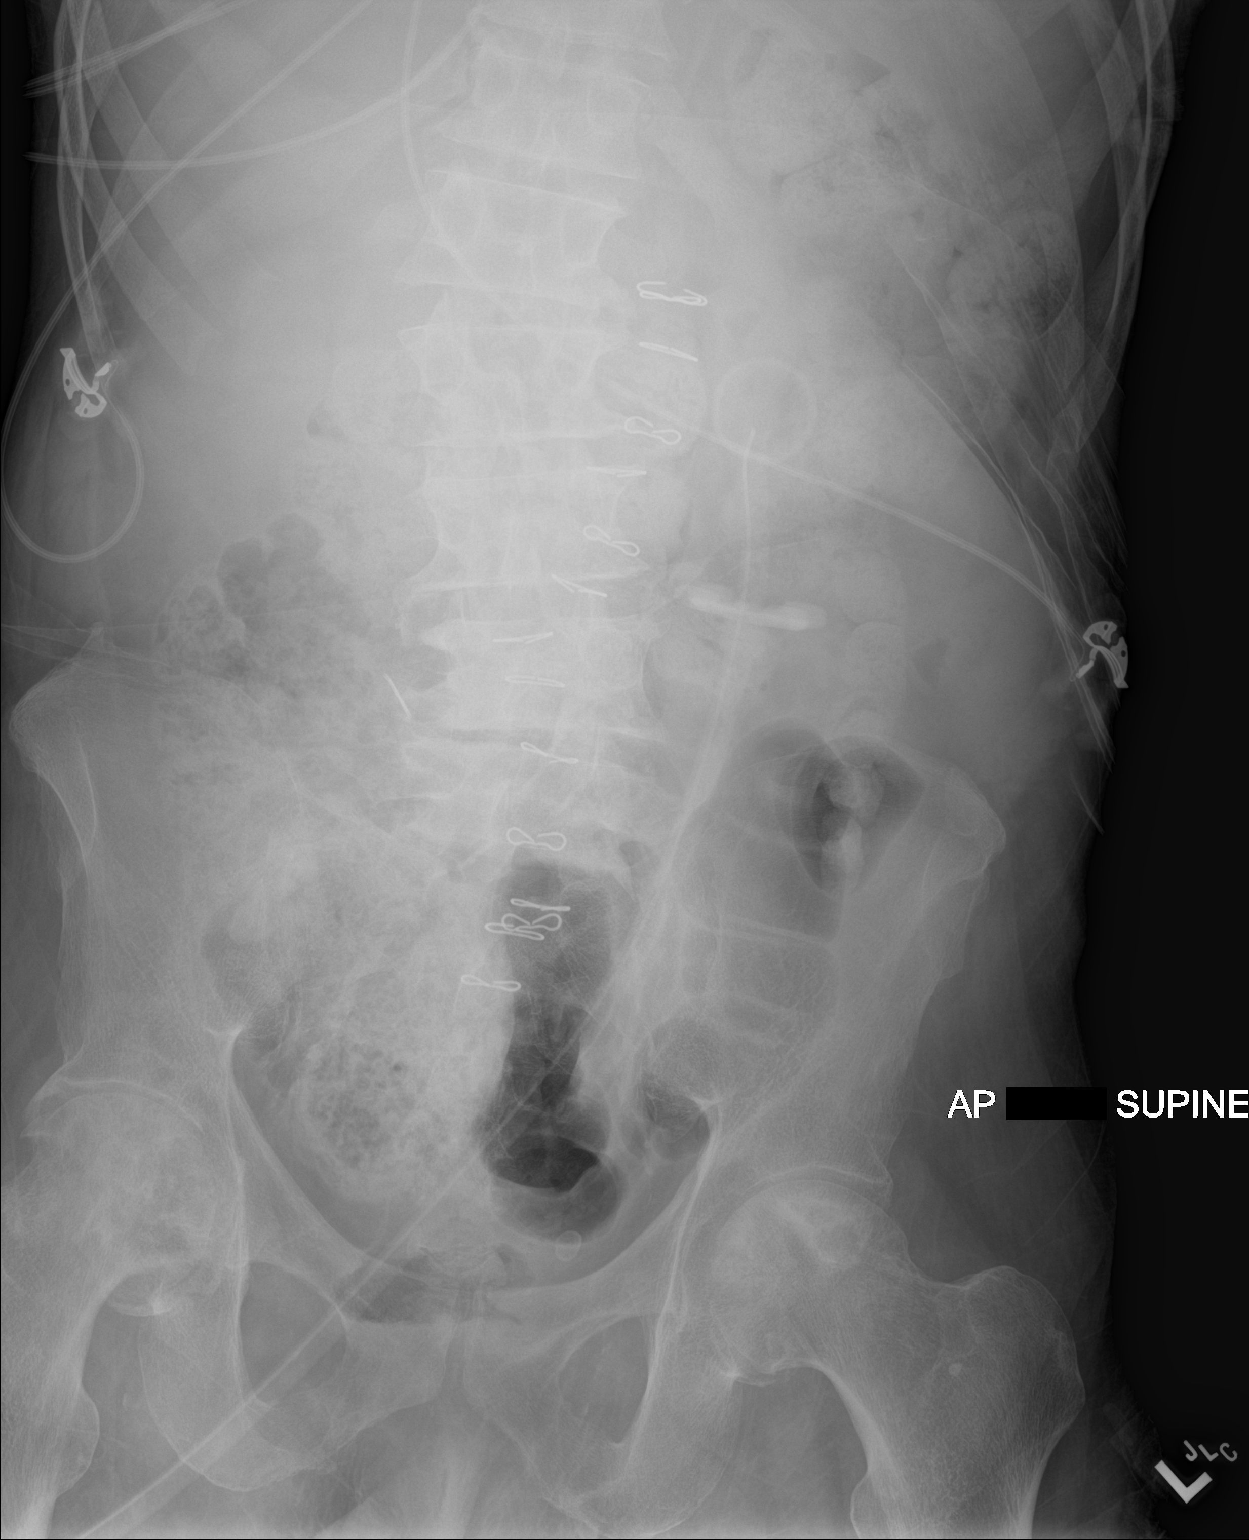

[1 of 1 positions shown; findings below may reference images not displayed]

FINDINGS: Percutaneous gastrostomy tube terminates in the left abdomen. Skin
staples overlie the midline abdomen. No dilated small bowel loops.
Moderate stool throughout the large bowel, increased. Mild gaseous
distention of the large bowel, decreased. No evidence of pneumatosis
or pneumoperitoneum. Mild-to-moderate lumbar spondylosis. Asymmetric
prominent osteoarthritis in the right hip joint.
IMPRESSION: Nonobstructive bowel gas pattern. Moderate colonic stool volume
suggests constipation. Gaseous distention of the small and large
bowel has significantly decreased.

## 2017-12-30 IMAGING — DX DG CHEST 1V PORT
1 series · 1 of 1 positions shown · non-contrast
Comparison: 11/16/2016 chest radiograph.

CLINICAL DATA: Inpatient.  Pneumonia.

EXAM:
PORTABLE CHEST 1 VIEW

[chest ap]
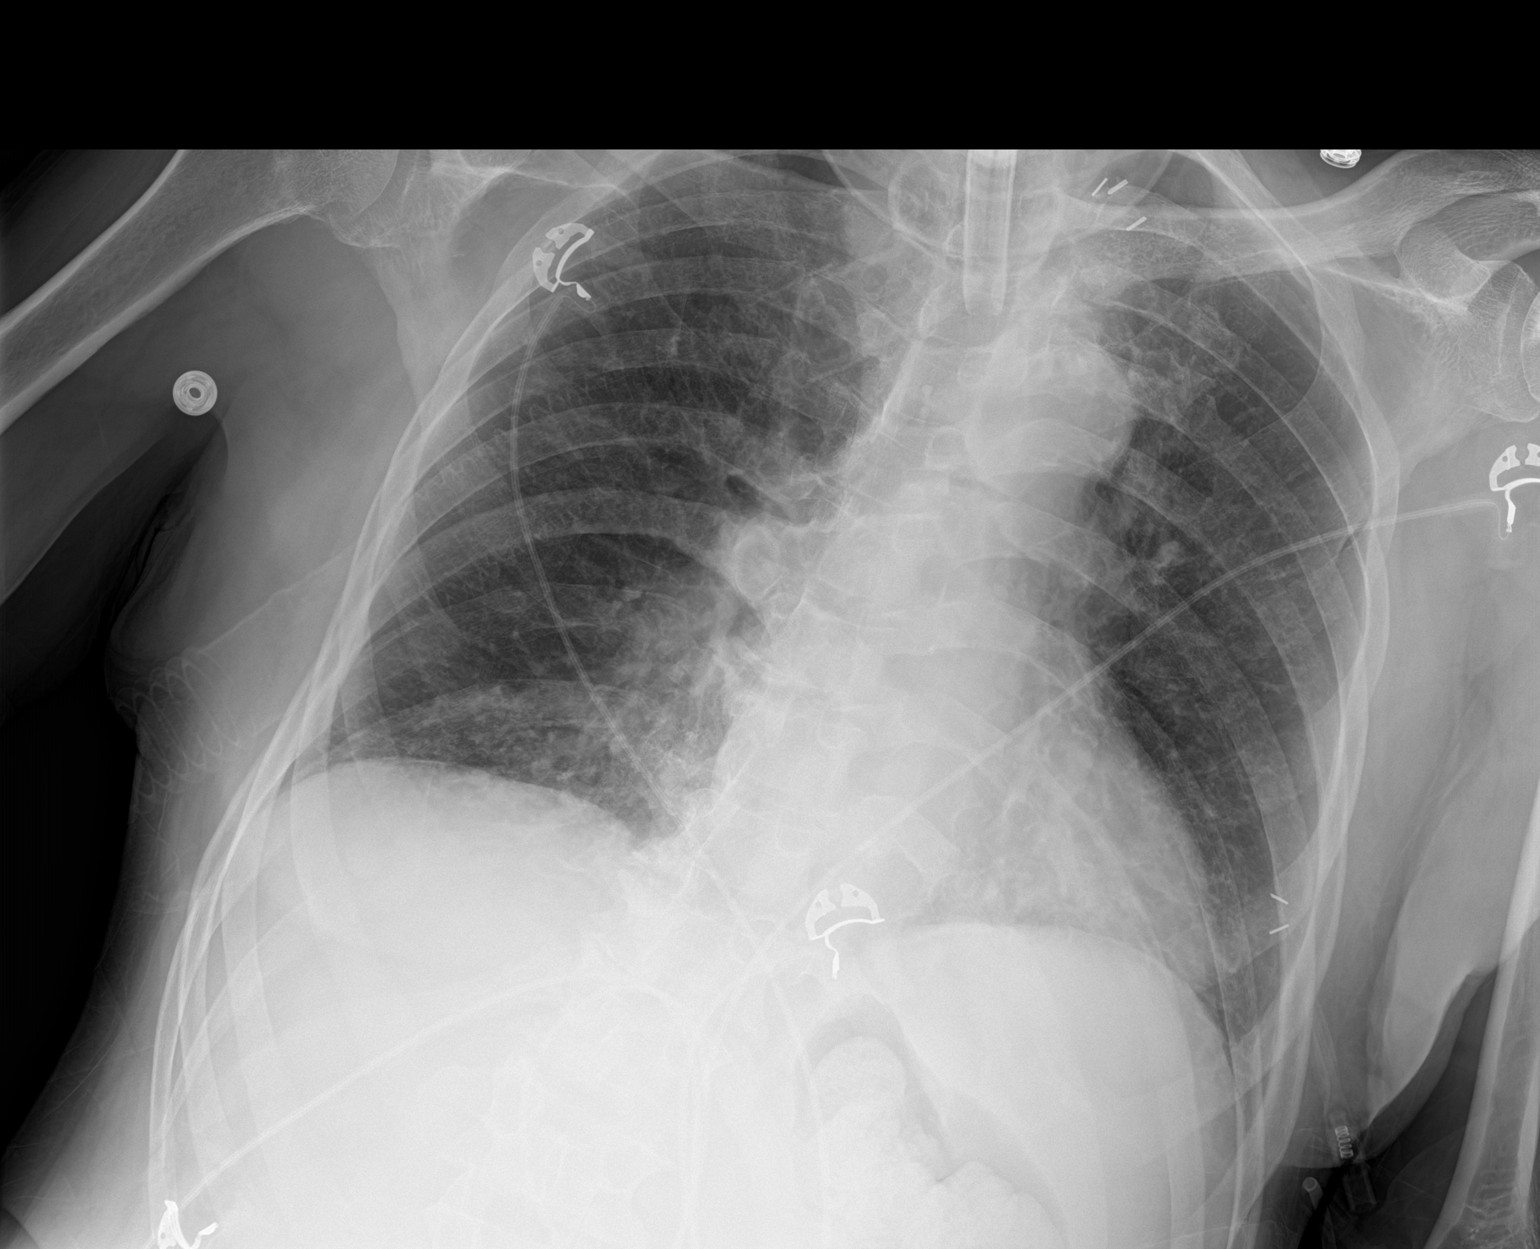

[1 of 1 positions shown; findings below may reference images not displayed]

FINDINGS: Tracheostomy tube overlies the tracheal air column just below
thoracic inlet. Stable surgical clips in the left supraclavicular
region and overlying the lateral lower left chest. Stable
cardiomediastinal silhouette with normal heart size. No
pneumothorax. No pleural effusion. Patchy bibasilar lung opacities
appear slightly decreased bilaterally. No pulmonary edema.
IMPRESSION: Patchy bibasilar lung opacities, slightly decreased bilaterally,
suggesting clearing pneumonia and/ or decreased atelectasis.

## 2018-01-01 ENCOUNTER — Emergency Department (HOSPITAL_BASED_OUTPATIENT_CLINIC_OR_DEPARTMENT_OTHER)
Admission: EM | Admit: 2018-01-01 | Discharge: 2018-01-01 | Disposition: A | Discharge status: Home / Self Care | Payer: Medicare HMO | Attending: Emergency Medicine | Admitting: Emergency Medicine

## 2018-01-01 ENCOUNTER — Emergency Department (HOSPITAL_BASED_OUTPATIENT_CLINIC_OR_DEPARTMENT_OTHER): Payer: Medicare HMO

## 2018-01-01 ENCOUNTER — Other Ambulatory Visit: Payer: Self-pay

## 2018-01-01 ENCOUNTER — Encounter (HOSPITAL_BASED_OUTPATIENT_CLINIC_OR_DEPARTMENT_OTHER): Payer: Self-pay | Admitting: *Deleted

## 2018-01-01 DIAGNOSIS — K9423 Gastrostomy malfunction: Secondary | ICD-10-CM | POA: Insufficient documentation

## 2018-01-01 DIAGNOSIS — I129 Hypertensive chronic kidney disease with stage 1 through stage 4 chronic kidney disease, or unspecified chronic kidney disease: Secondary | ICD-10-CM | POA: Diagnosis not present

## 2018-01-01 DIAGNOSIS — Z87891 Personal history of nicotine dependence: Secondary | ICD-10-CM | POA: Insufficient documentation

## 2018-01-01 DIAGNOSIS — Z79899 Other long term (current) drug therapy: Secondary | ICD-10-CM | POA: Diagnosis not present

## 2018-01-01 DIAGNOSIS — Z7982 Long term (current) use of aspirin: Secondary | ICD-10-CM | POA: Diagnosis not present

## 2018-01-01 DIAGNOSIS — Z8673 Personal history of transient ischemic attack (TIA), and cerebral infarction without residual deficits: Secondary | ICD-10-CM | POA: Insufficient documentation

## 2018-01-01 DIAGNOSIS — N183 Chronic kidney disease, stage 3 (moderate): Secondary | ICD-10-CM | POA: Insufficient documentation

## 2018-01-01 LAB — CBC
HCT: 49.6 % (ref 39.0–52.0)
Hemoglobin: 15.7 g/dL (ref 13.0–17.0)
MCH: 30.1 pg (ref 26.0–34.0)
MCHC: 31.7 g/dL (ref 30.0–36.0)
MCV: 95 fL (ref 78.0–100.0)
Platelets: 410 10*3/uL — ABNORMAL HIGH (ref 150–400)
RBC: 5.22 MIL/uL (ref 4.22–5.81)
RDW: 15.3 % (ref 11.5–15.5)
WBC: 10.8 10*3/uL — ABNORMAL HIGH (ref 4.0–10.5)

## 2018-01-01 LAB — BASIC METABOLIC PANEL
Anion gap: 9 (ref 5–15)
BUN: 32 mg/dL — ABNORMAL HIGH (ref 8–23)
CO2: 30 mmol/L (ref 22–32)
Calcium: 10 mg/dL (ref 8.9–10.3)
Chloride: 99 mmol/L (ref 98–111)
Creatinine, Ser: 0.99 mg/dL (ref 0.61–1.24)
GFR calc Af Amer: >60 mL/min (ref >60)
GFR calc non Af Amer: >60 mL/min (ref >60)
Glucose, Bld: 85 mg/dL (ref 70–99)
Potassium: 5.4 mmol/L — ABNORMAL HIGH (ref 3.5–5.1)
Sodium: 138 mmol/L (ref 135–145)

## 2018-01-01 NOTE — Discharge Instructions (Addendum)
Consider replacing the g tube, apply abx ointment as recommended by your doctor to the wound,  monitor for fever, worsening symptoms

## 2018-01-01 NOTE — ED Triage Notes (Signed)
Arrived PTAR from md office for skin break down around g tube  ? Stage 2   Pt also has foley, trach,  Unable to understand pts speech,  Wife and home health aid at bedside

## 2018-01-01 NOTE — ED Provider Notes (Signed)
[facility] EMERGENCY DEPARTMENT Provider Note   CSN: [id number] Arrival date & time: 01/01/18  1553     History   Chief Complaint Chief Complaint  Patient presents with  . Wound Infection    HPI Scott Rivera is a 67 y.o. male.  HPI Patient presented to the emergency room for evaluation of wound breakdown around his feeding tube.  Patient has a history of multiple medical problems including a prior stroke.  He has a PEG and tracheostomy tube.  Patient is unable to communicate.  Patient has been having difficulty with leaking from his feeding tube.  Patient went to see the primary care doctor today.  According to the caregiver they were instructed to come to the emergency room to have that evaluated.  They also requested the patient had routine follow-up lab tests including blood tests and a chest x-ray.  Patient was in the hospital on September 7 for an aspiration pneumonia. Past Medical History:  Diagnosis Date  . Anemia due to chronic kidney disease   . Aspiration pneumonia ([place])   . Chronic kidney disease, stage 3 (moderate) (HCC)   . Hypertension   . Stroke [facility])     Patient Active Problem List   Diagnosis Date Noted  . Acute respiratory failure with hypoxia and hypercarbia (HCC)   . Acute pulmonary edema (HCC)   . 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 Aspiration of gastric contents   . Malnutrition of moderate degree 11/19/2017  . Aspiration pneumonia ([place]) 11/16/2017  . Nausea and vomiting 11/16/2017  . Hyperkalemia 06/26/2017  . Hematuria 06/26/2017  . Bronchitis/??? Pneumonia 03/30/2017  . S/P percutaneous endoscopic gastrostomy (PEG) tube placement ([place]) 03/30/2017  . Chronic anemia 03/30/2017  . Foley catheter in place on admission 03/30/2017  . UTI (urinary tract infection) 03/30/2017  . MDR Acinetobacter baumannii infection   . Gastrostomy tube obstruction ([place])   . PEG tube malfunction ([place])   . Acute respiratory failure with hypoxia ([place])  03/02/2017  . Loculated pleural effusion   . Acute respiratory distress   . Fever   . Follow up   . HCAP (healthcare-associated pneumonia)   . 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 Hypoxemia   . H/O tracheostomy   . Pneumothorax   . Pneumothorax, closed, traumatic, initial encounter 12/10/2016  . Sepsis ([place]) 12/09/2016  . Hypotension 12/09/2016  . Acute-on-chronic kidney injury ([place]) 12/09/2016  . Elevated troponin 12/09/2016  . Anemia due to chronic kidney disease 12/09/2016  . Compensated metabolic alkalosis 38/75/6433  . Pneumonia   . Goals of care, counseling/discussion   . Palliative care by specialist   . Respiratory failure ([place])   . Aspiration into airway   . Septic shock ([place])   . Dysphagia, post-stroke   . Benign essential HTN   . Tobacco abuse   . Tachypnea   . Hyperglycemia   . Hypernatremia   . Leukocytosis (leucocytosis)   . Acute blood loss anemia   . Chronic kidney disease   . Pressure injury of skin 10/20/2016  . Cerebellar infarct ([place]) 10/19/2016  . ARF (acute renal failure) ([place]) 10/19/2016  . Essential hypertension 10/19/2016  . Rhabdomyolysis 10/19/2016  . History of completed stroke 10/19/2016  . Bilateral chronic knee pain 01/07/2016  . History of adenomatous polyp of colon 07/28/2015  . Hyperlipidemia 07/28/2013  . Helicobacter pylori infection 09/21/2012    Past Surgical History:  Procedure Laterality Date  . IR GASTROSTOMY TUBE MOD SED  10/27/2016  . IR PATIENT EVAL TECH 0-60 MINS  01/05/2017  . IR PATIENT EVAL TECH 0-60 MINS  01/09/2017  . IR PATIENT EVAL TECH 0-60 MINS  03/03/2017  . IR PATIENT EVAL TECH 0-60 MINS  04/03/2017  . IR REPLACE G-TUBE SIMPLE WO FLUORO  11/20/2017  . IR [place] TUBE PERCUT W/FLUORO  07/23/2017  . KNEE SURGERY    . TRACHEOSTOMY TUBE PLACEMENT          Home Medications    Prior to Admission medications   Medication Sig Start Date End Date Taking? Authorizing Provider    acetaminophen (TYLENOL) 500 MG tablet Place 1,000 mg into feeding tube 3 (three) times daily.     [provider]  Amino Acids-Protein Hydrolys (FEEDING SUPPLEMENT, PRO-STAT SUGAR FREE 64,) LIQD Take 30 mLs by mouth at bedtime.    [provider]  amLODipine (NORVASC) 10 MG tablet Place 1 tablet (10 mg total) into feeding tube daily. 11/23/17   Kayleen Memos, DO  aspirin 81 MG chewable tablet Place 1 tablet (81 mg total) into feeding tube daily. 04/05/17   Nita Sells, MD  atropine 1 % ophthalmic solution Place 2 drops under the tongue every 8 (eight) hours as needed (excessive secretion). Patient taking differently: Place 2 drops under the tongue 2 (two) times daily.  07/03/17   Lavina Hamman, MD  bisacodyl (DULCOLAX) 10 MG suppository Place 1 suppository (10 mg total) rectally daily. 12/22/17   Thurnell Lose, MD  cloNIDine (CATAPRES - DOSED IN MG/24 HR) 0.1 mg/24hr patch Place 1 patch (0.1 mg total) onto the skin once a week. Patient taking differently: Place 0.1 mg onto the skin every Tuesday.  04/10/17   Nita Sells, MD  clopidogrel (PLAVIX) 75 MG tablet Place 1 tablet (75 mg total) into feeding tube daily. 01/11/17   Cherene Altes, MD  gabapentin (NEURONTIN) 100 MG capsule 100 mg See admin instructions. Open 1 capsule (100 mg) and administer contents via feeding tube three times daily    [provider]  ipratropium-albuterol (DUONEB) 0.5-2.5 (3) MG/3ML SOLN Take 3 mLs by nebulization every 4 (four) hours as needed. Patient taking differently: Take 3 mLs by nebulization every 4 (four) hours as needed (shortness of breath/wheezing).  01/10/17   Cherene Altes, MD  metoprolol tartrate (LOPRESSOR) 50 MG tablet Take 1 tablet (50 mg total) by mouth 2 (two) times daily. 12/21/17   Thurnell Lose, MD  montelukast (SINGULAIR) 10 MG tablet Place 10 mg into feeding tube at bedtime.     [provider]  mouth rinse LIQD solution 10 mLs by  Mouth Rinse route 4 (four) times daily.    [provider]  Nutritional Supplements (FEEDING SUPPLEMENT, JEVITY 1.2 CAL,) LIQD Place 1,000 mLs into feeding tube continuous. 11/22/17   Kayleen Memos, DO  pantoprazole (PROTONIX) 40 MG tablet Take 1 tablet (40 mg total) by mouth daily. 12/21/17   Thurnell Lose, MD  polyethylene glycol [facility]) packet Place 17 g into feeding tube 2 (two) times daily. 12/21/17 01/20/18  Thurnell Lose, MD  pravastatin (PRAVACHOL) 20 MG tablet Place 1 tablet (20 mg total) into feeding tube daily at 6 PM. Patient taking differently: Place 20 mg daily into feeding tube.  01/10/17   Cherene Altes, MD  ranitidine (ZANTAC) 150 MG/10ML syrup Place 10 mLs (150 mg total) into feeding tube daily. Patient not taking: Reported on 12/18/2017 11/23/17   Kayleen Memos, DO  senna [facility])  8.6 MG TABS tablet Take 1 tablet by mouth daily.    [provider]  thiamine 100 MG tablet Place 1 tablet (100 mg total) into feeding tube daily. 01/11/17   Cherene Altes, MD  Water For Irrigation, Sterile (FREE WATER) SOLN Place 100 mLs into feeding tube daily. Patient taking differently: Place 100 mLs into feeding tube every 3 (three) hours.  07/03/17   Lavina Hamman, MD    Family History Family History  Problem Relation Age of Onset  . Diabetes Mellitus II Brother   . CAD Neg Hx   . Stroke Neg Hx     Social History Social History   Tobacco Use  . Smoking status: Former Research scientist (life sciences)  . Smokeless tobacco: Never Used  . Tobacco comment: Smoked heavily until the day of his stroke  Substance Use Topics  . Alcohol use: Not Currently    Comment: Drank heavily prior to stroke per sister  . Drug use: Not Currently     Allergies   Patient has no known allergies.   Review of Systems Review of Systems  All other systems reviewed and are negative.    Physical Exam Updated Vital Signs BP 138/89   Pulse 73   Temp 98 F (36.7 C) (Tympanic)   Resp 16   SpO2  96%   Physical Exam  HENT:  Head: Normocephalic and atraumatic.  Right Ear: External ear normal.  Left Ear: External ear normal.  Eyes: Conjunctivae are normal. Right eye exhibits no discharge. Left eye exhibits no discharge. No scleral icterus.  Neck: Neck supple. No tracheal deviation present.  Cardiovascular: Normal rate, regular rhythm and intact distal pulses.  Pulmonary/Chest: Effort normal and breath sounds normal. No stridor. No respiratory distress. He has no wheezes. He has no rales.  Abdominal: Soft. Bowel sounds are normal. He exhibits no distension. There is no tenderness. There is no rebound and no guarding.  No surrounding erythema around the PEG tube, patient appears to have some mucopurulent versus feeding tube drainage around the PEG site  Musculoskeletal: He exhibits no edema or tenderness.  Neurological: He is alert. He has normal strength. No cranial nerve deficit (no facial droop,  ) or sensory deficit. He exhibits normal muscle tone. He displays no seizure activity. Coordination normal.  Patient has strong grip strength bilateral upper extremities, he has strong lower extremity strength.  Patient is unable to speak but he has good eye contact and vigorously moves are hands away when we attempt to examine him  Skin: Skin is warm and dry. No rash noted. He is not diaphoretic.  Psychiatric: He is aggressive.  Nursing note and vitals reviewed.    ED Treatments / Results  Labs (all labs ordered are listed, but only abnormal results are displayed) Labs Reviewed  CBC - Abnormal; Notable for the following components:      Result Value   WBC 10.8 (*)    Platelets 410 (*)    All other components within normal limits  BASIC METABOLIC PANEL - Abnormal; Notable for the following components:   Potassium 5.4 (*)    BUN 32 (*)    All other components within normal limits    EKG None  Radiology Dg Chest 2 View  Result Date: 01/01/2018 CLINICAL DATA:  Follow-up of  recent pneumonia EXAM: CHEST - 2 VIEW COMPARISON:  Chest radiograph 12/20/2017 FINDINGS: Aeration of the lungs is improved. There are persistent diffuse interstitial opacities. There is a tracheostomy tube with its tip at  the level of the clavicular heads. Surgical close project over the left lung apex. Cardiomediastinal contours are normal. No pleural effusion or pneumothorax. IMPRESSION: Improved aeration of the lungs with persistent interstitial opacities that may indicate mild edema or chronic interstitial scarring. Electronically Signed   By: Ulyses Jarred M.D.   On: 01/01/2018 17:09    Procedures Procedures (including critical care time)  Medications Ordered in ED Medications - No data to display   Initial Impression / Assessment and Plan / ED Course  I have reviewed the triage vital signs and the nursing notes.  Pertinent labs & imaging results that were available during my care of the patient were reviewed by me and considered in my medical decision making (see chart for details).  Clinical Course as of Jan 01 1806  Tue Jan 01, 2018  1635 According to the caregiver the patient's had a feeding tube for over the past year   [JK]  [place] reviewed.  Slight increase in potassium but normal creatinine.  I doubt this is clinically significant.  CBC shows a slight increase in white blood cell count but the patient is afebrile and I doubt suggestive of a severe infection   [JK]  1752 X-ray shows improvement of his previous operation   [JK]    Clinical Course User Index [JK] Dorie Rank, MD    Patient's laboratory tests not show any significant abnormalities.  Chest x-ray shows improvement.  Patient does have some mild irritation to the skin but no evidence of surrounding cellulitis.  Recommend replacing his G-tube because of the drainage and plan antibiotic ointment to the skin.  Monitor for fever worsening symptoms  Final Clinical Impressions(s) / ED Diagnoses   Final diagnoses:    Leaking percutaneous endoscopic gastrostomy (PEG) tube [facility])    ED Discharge Orders    None       Dorie Rank, MD 01/01/18 [phone]

## 2018-01-06 IMAGING — RF DG SWALLOWING FUNCTION - NRPT [facility]
1 series · 18 of 24 positions shown · non-contrast
Comparison: none

[Series 1: run · 31 acquisitions, 18 frames shown]
[im 1/31]
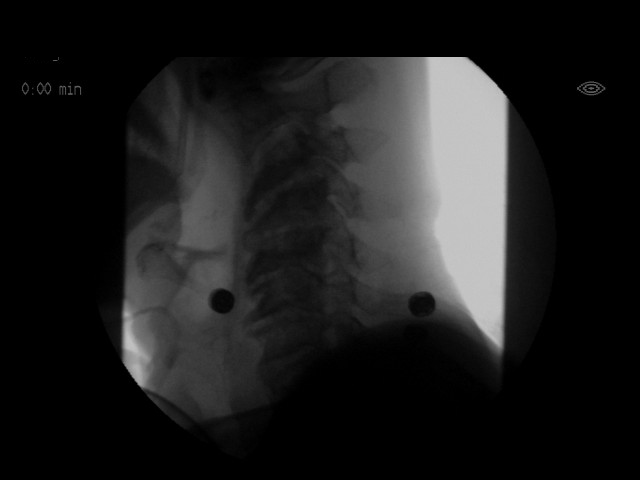
[im 3/31]
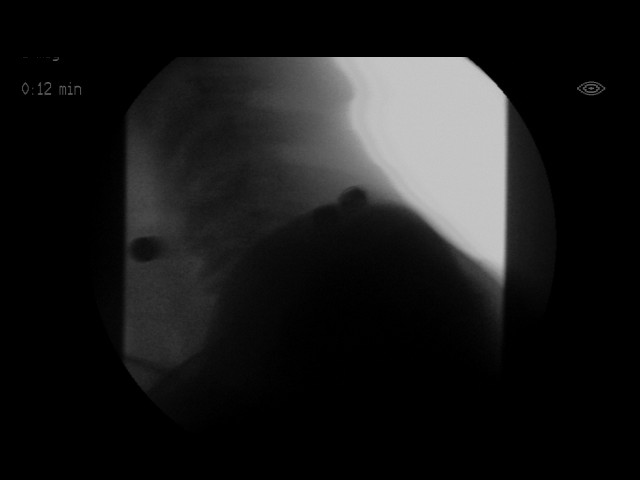
[im 4/31]
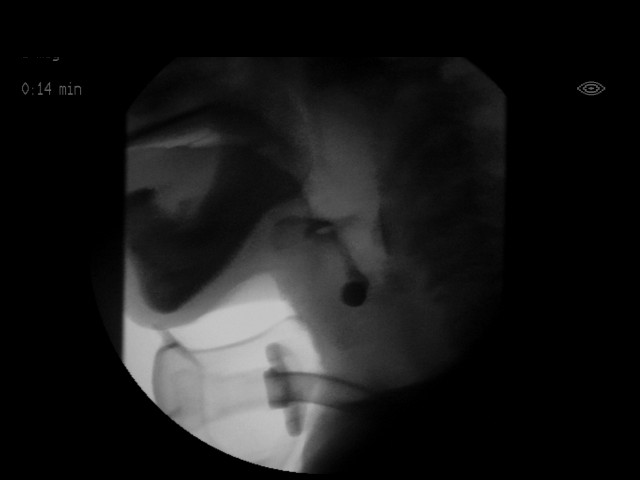
[im 6/31]
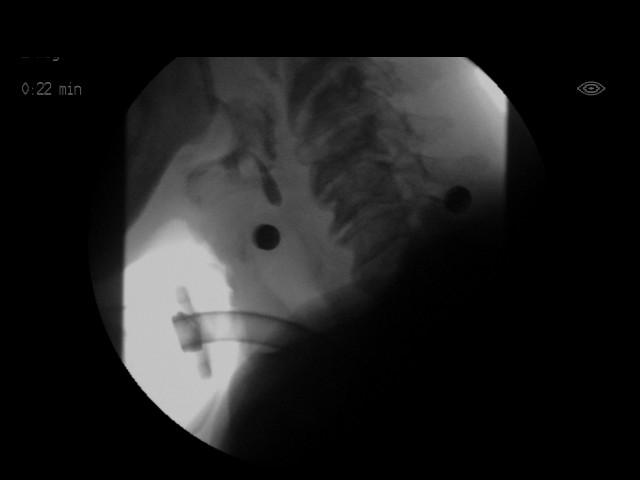
[im 8/31]
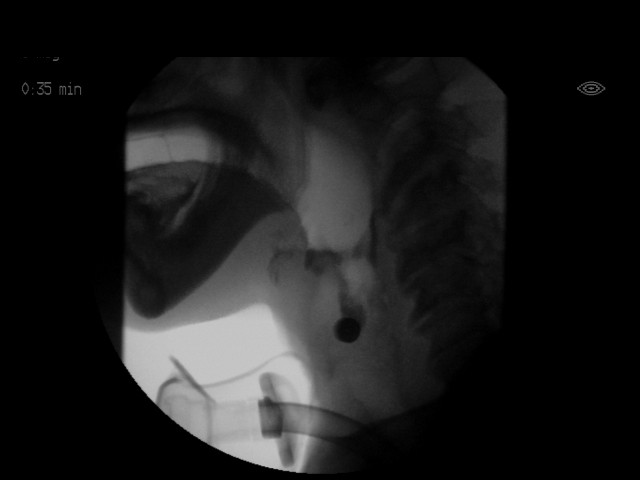
[im 10/31]
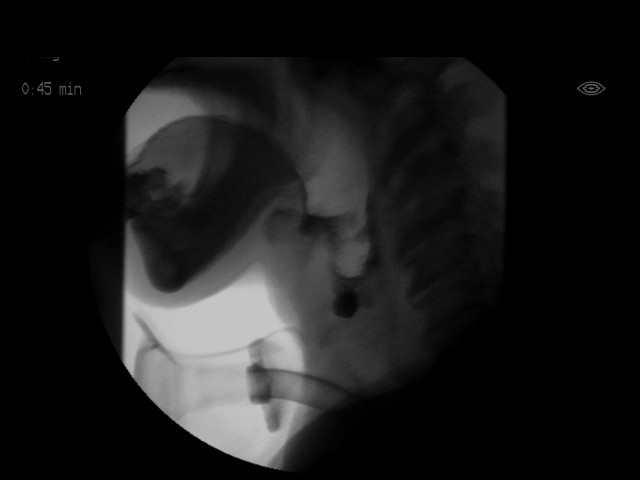
[im 11/31]
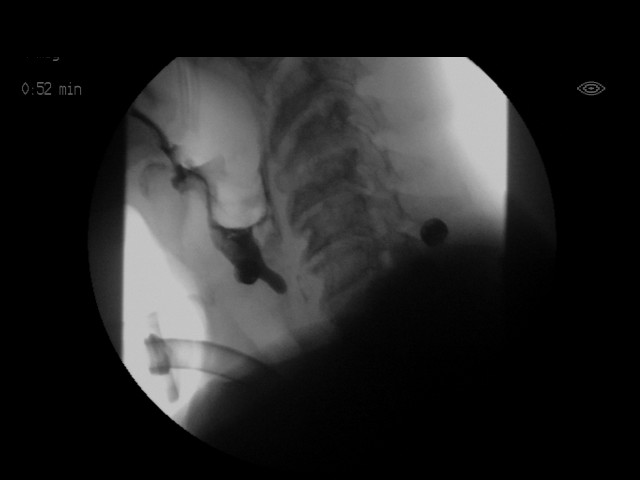
[im 14/31]
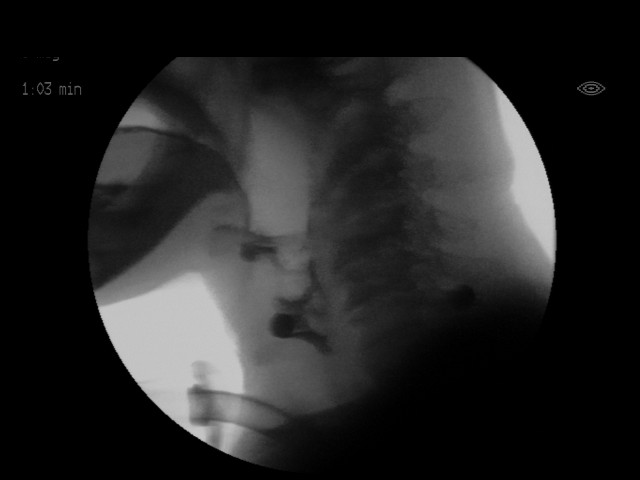
[im 15/31]
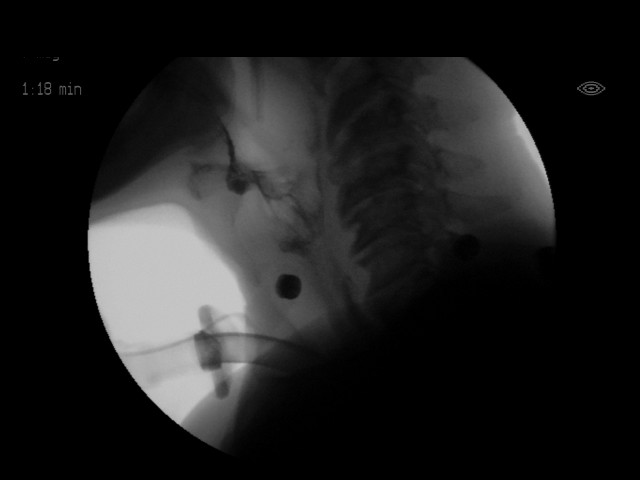
[im 16/31]
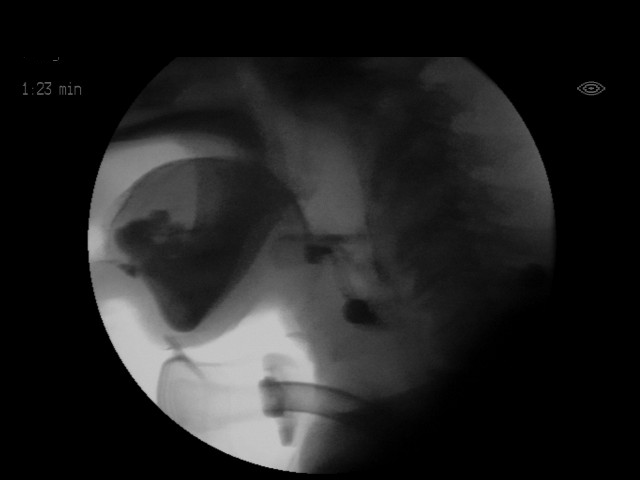
[im 19/31]
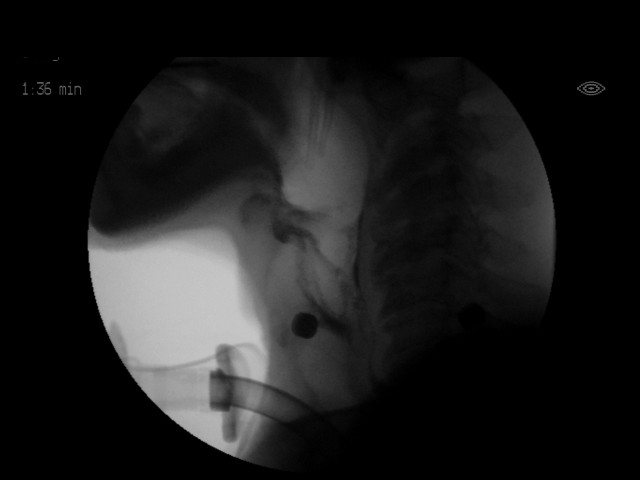
[im 20/31]
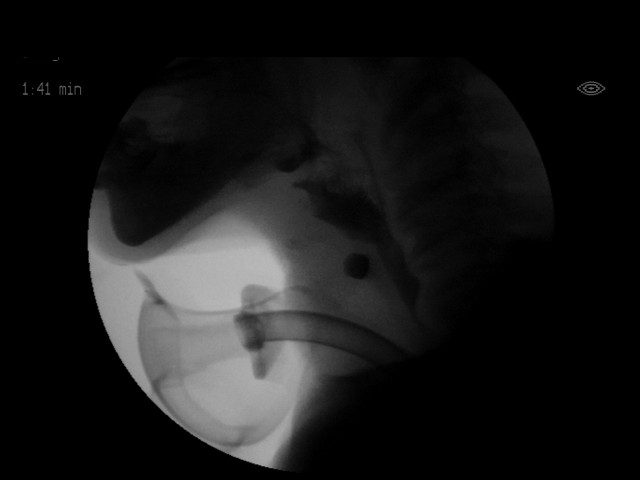
[im 21/31]
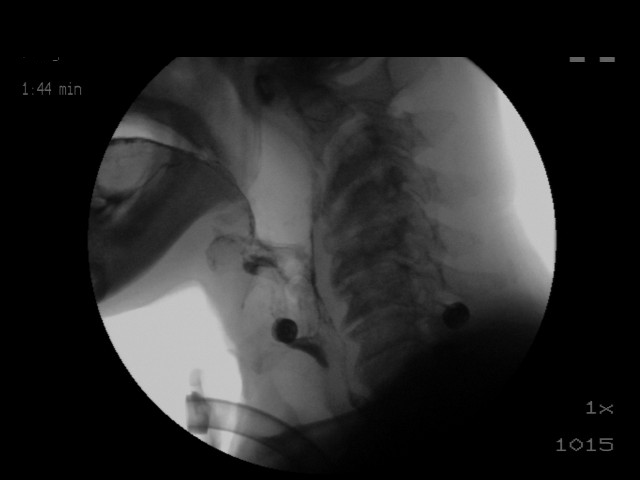
[im 24/31]
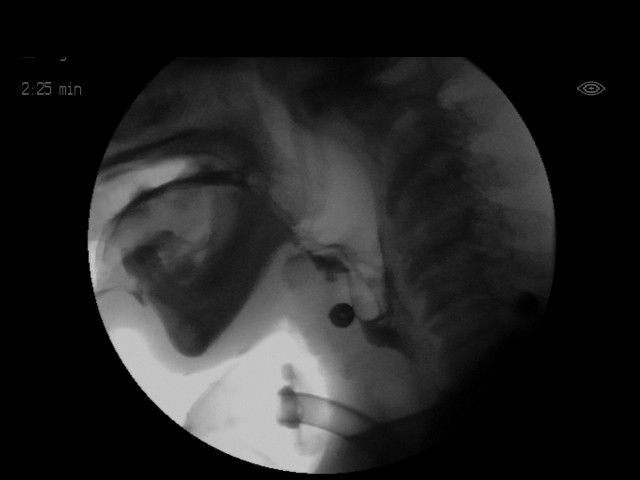
[im 25/31]
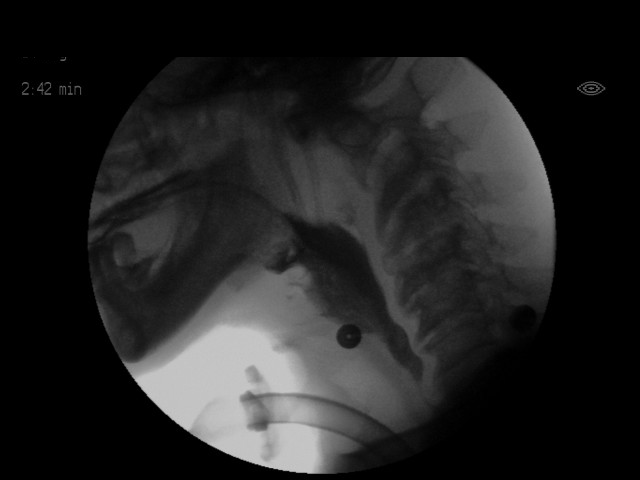
[im 27/31]
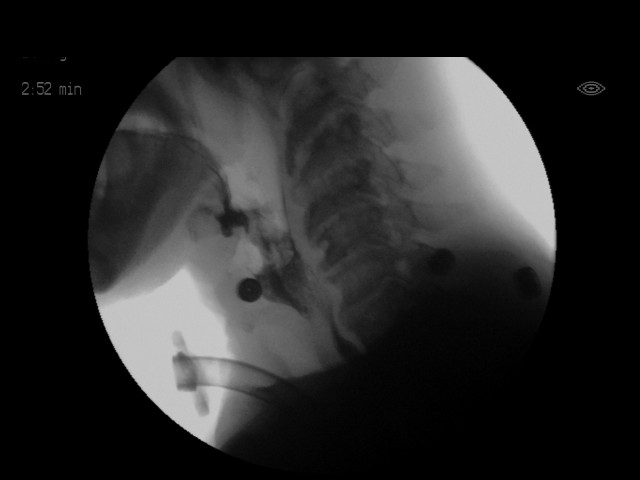
[im 29/31]
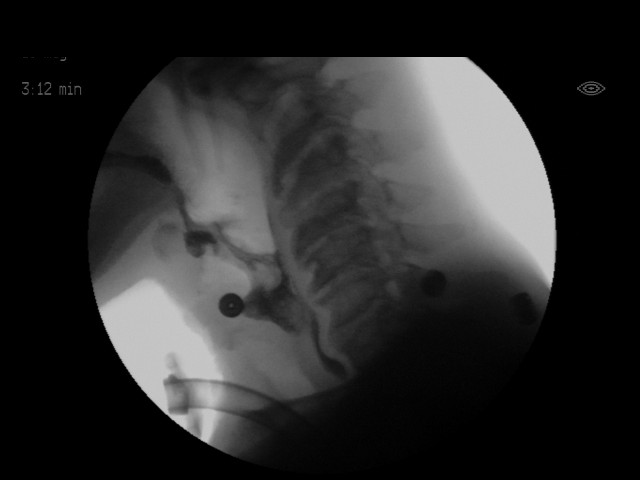
[im 31/31]
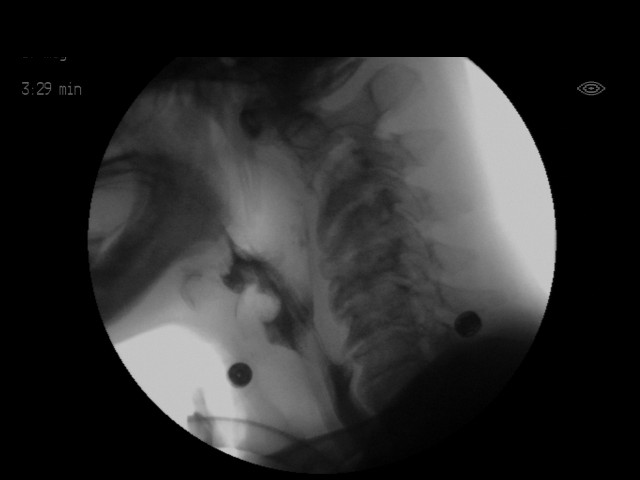

[18 of 24 positions shown; findings below may reference images not displayed]

FLUOROSCOPY FOR SWALLOWING FUNCTION STUDY:
Fluoroscopy was provided for swallowing function study, which was administered by a speech pathologist.  Final results and recommendations from this study are contained within the speech pathology report.

## 2018-01-18 IMAGING — DX DG CHEST 1V PORT
1 series · 1 of 1 positions shown · non-contrast
Comparison: Radiograph 11/20/2016

CLINICAL DATA: Pneumonia.

EXAM:
PORTABLE CHEST 1 VIEW

[chest ap]
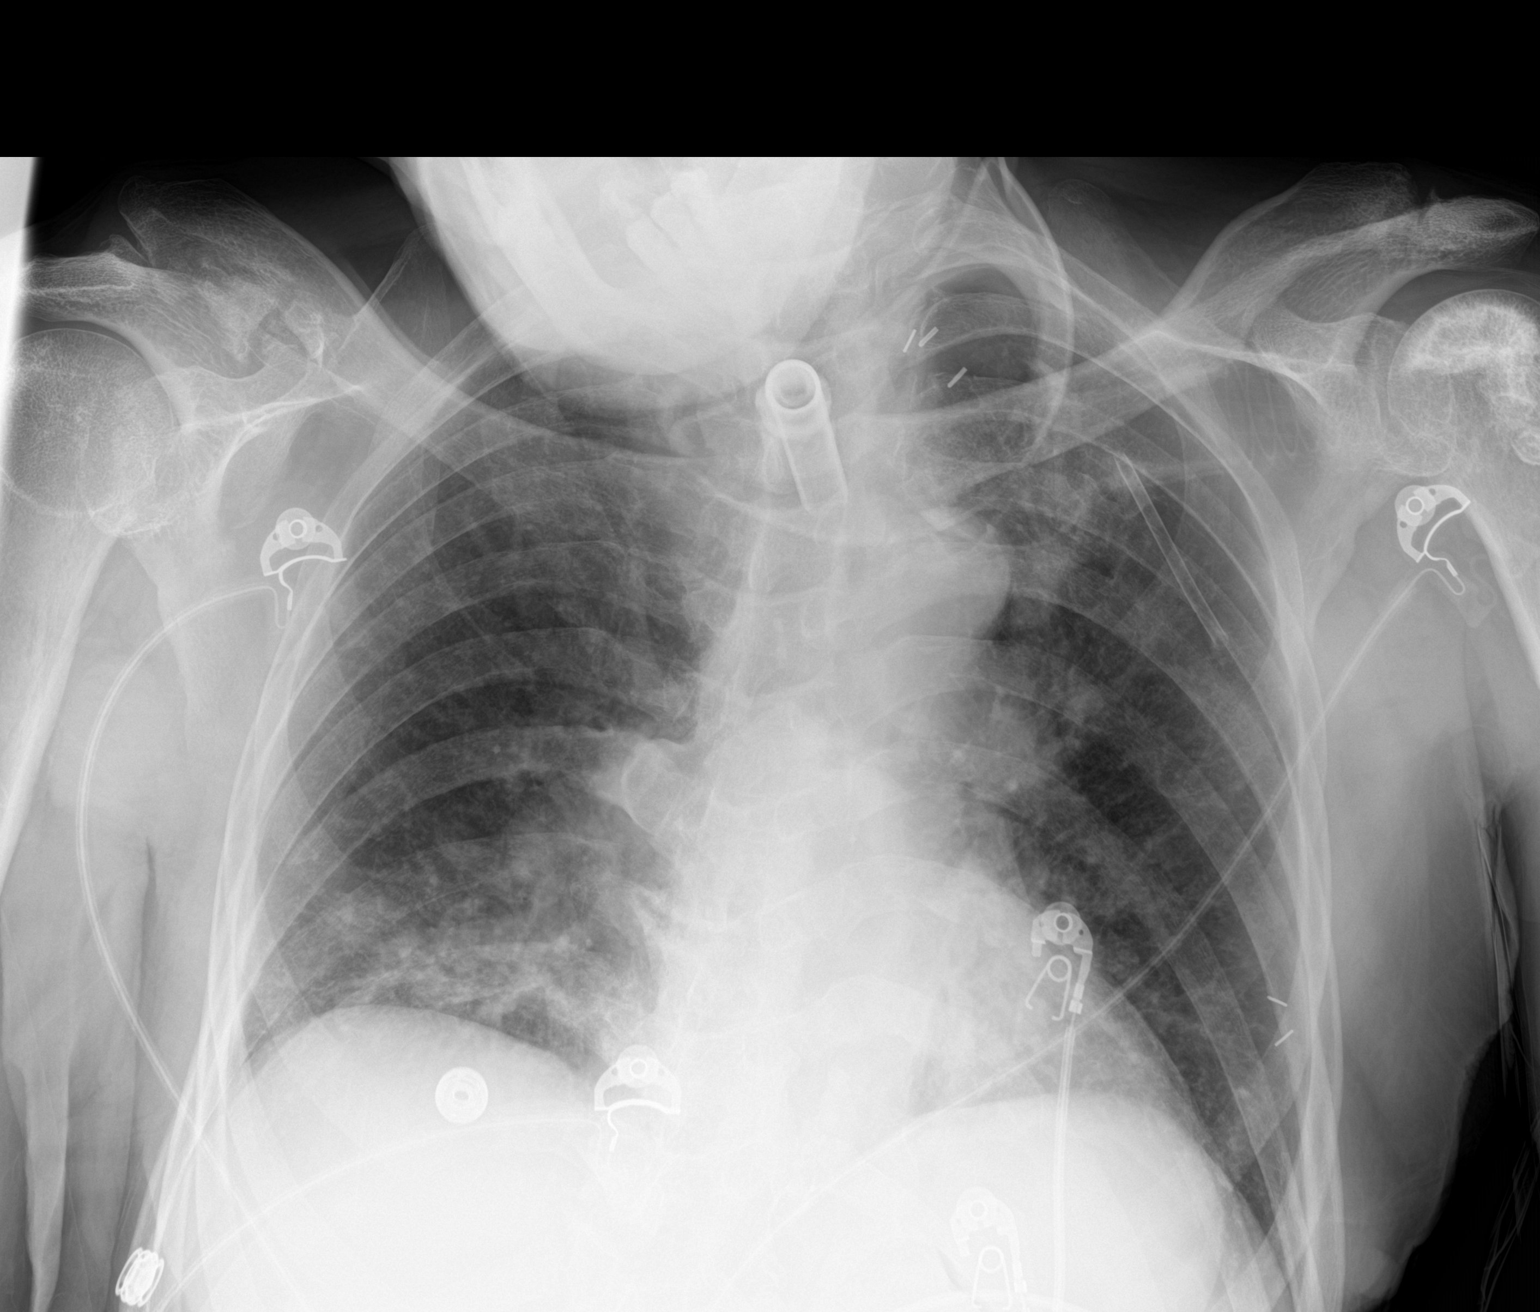

[1 of 1 positions shown; findings below may reference images not displayed]

FINDINGS: Tracheostomy tube at the thoracic inlet. Worsening patchy right lung
base opacity. Unchanged left basilar opacity. Upper lungs are clear,
the patient's chin obscures the right lung apex. Unchanged heart
size and mediastinal contours. No large pleural effusion. No
pneumothorax. Tubular catheter like density projecting over the left
upper chest is presumably external to the patient.
IMPRESSION: Increasing right lung base opacity. This may be pneumonia,
aspiration or atelectasis. Left basilar opacity is unchanged.

## 2018-01-19 IMAGING — DX DG CHEST 1V PORT
1 series · 1 of 1 positions shown · non-contrast
Comparison: 12/09/2016

CLINICAL DATA: Sepsis.

EXAM:
PORTABLE CHEST 1 VIEW

[chest ap]
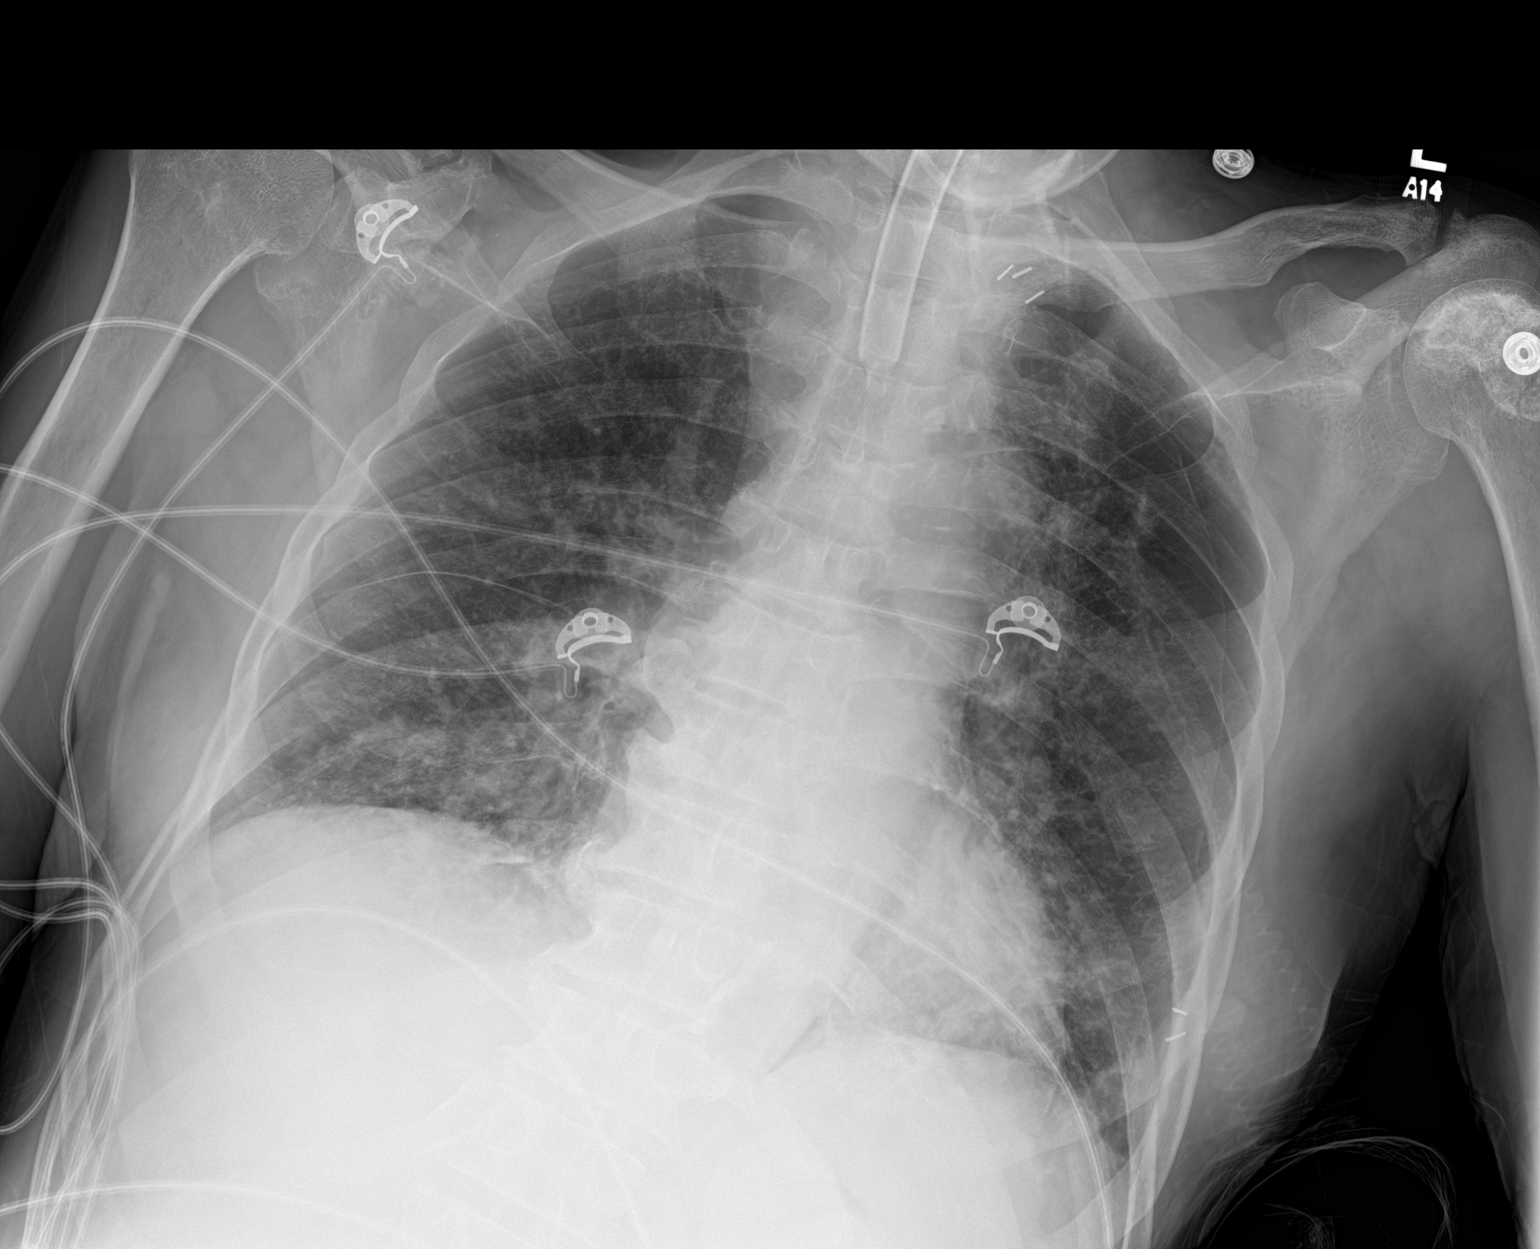

[1 of 1 positions shown; findings below may reference images not displayed]

FINDINGS: 7749 hours. There is a new small left-sided pneumothorax. Persistent
bibasilar airspace disease, similar to prior. The cardiopericardial
silhouette is within normal limits for size. Tracheostomy tube again
noted. Telemetry leads overlie the chest.
IMPRESSION: New small left pneumothorax.

Critical Value/emergent results were called by telephone at the time
of interpretation on 12/10/2016 at [DATE] to Dr. Ruby, who verbally
acknowledged these results.

## 2018-01-20 IMAGING — DX DG CHEST 1V PORT
1 series · 1 of 1 positions shown · non-contrast
Comparison: Chest x-ray 12/10/2016.  Chest x-ray 09/03/2016

CLINICAL DATA: Pneumothorax.

EXAM:
PORTABLE CHEST 1 VIEW

[chest ap]
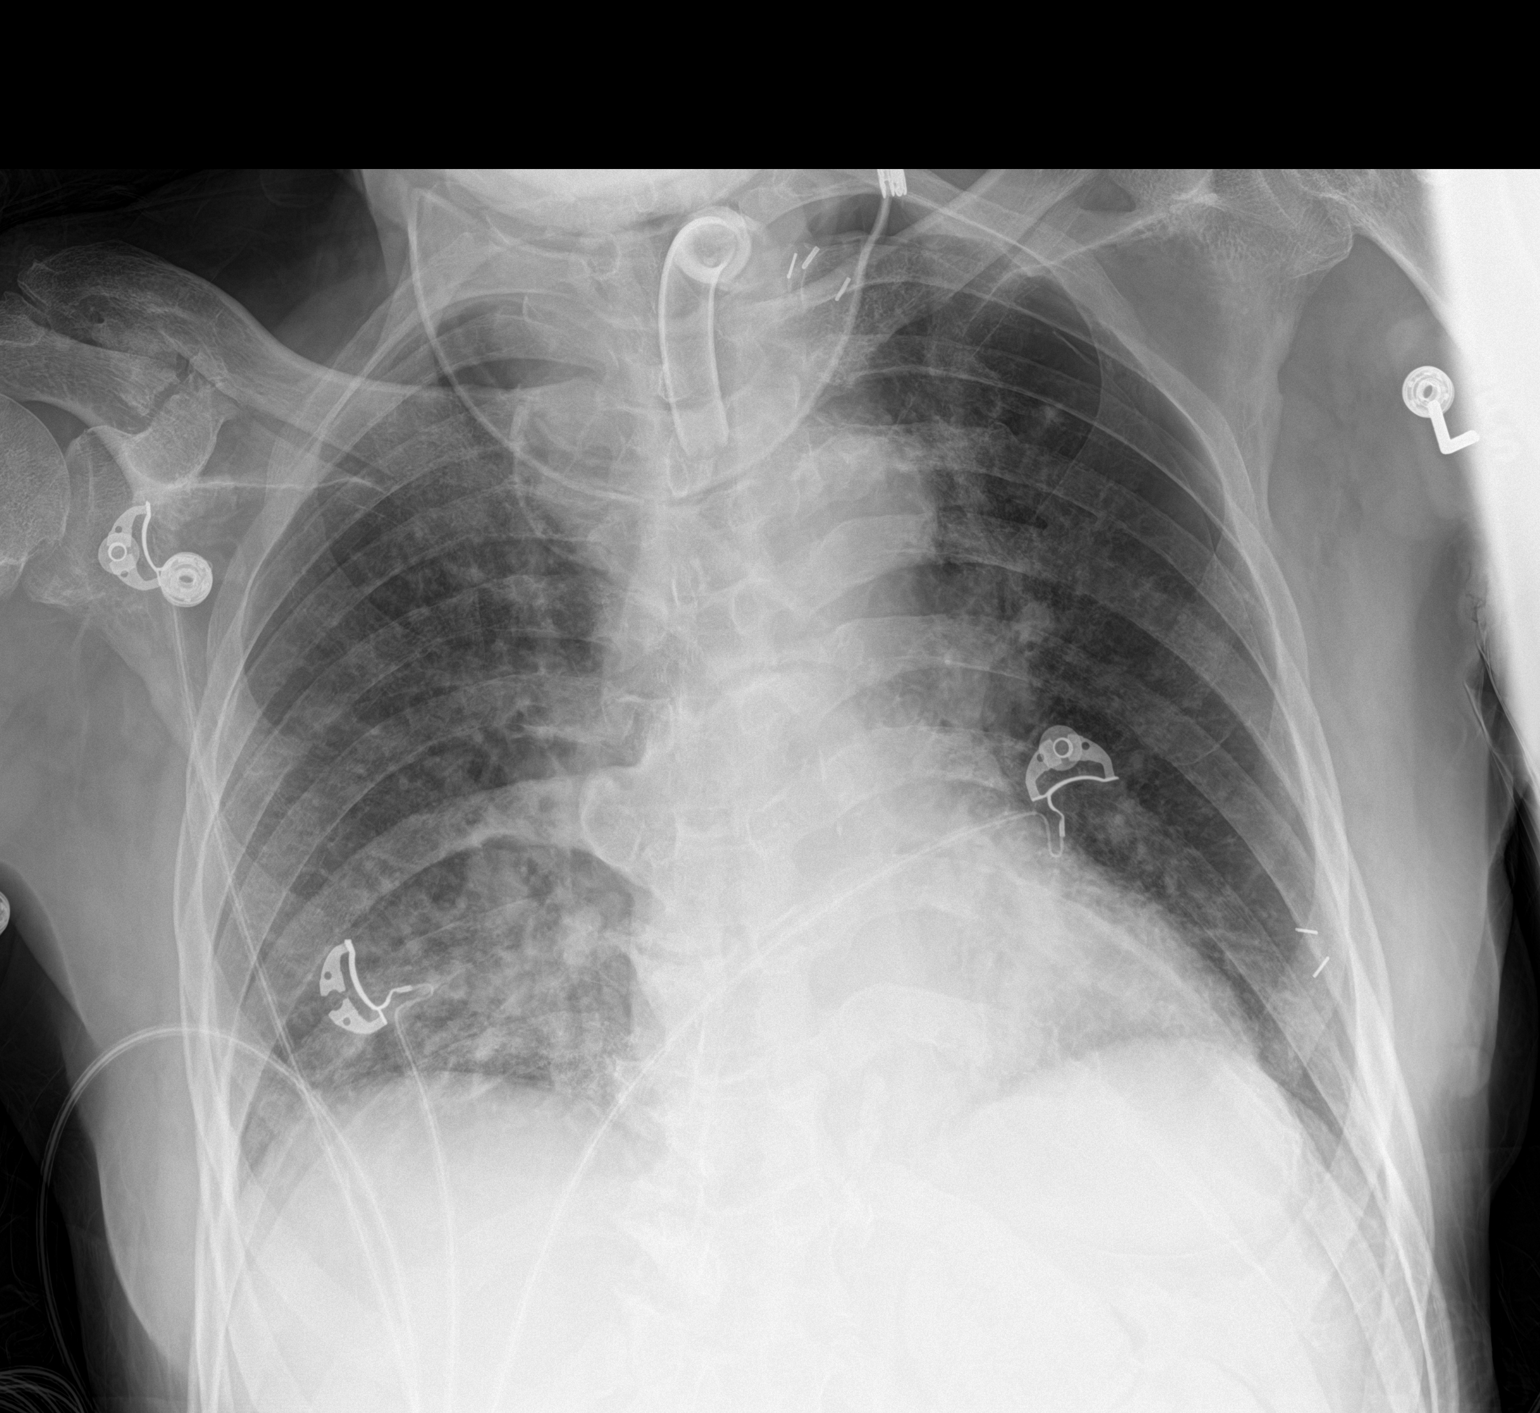

[1 of 1 positions shown; findings below may reference images not displayed]

FINDINGS: Tracheostomy tube noted in stable position. Cardiomegaly with
pulmonary vascular prominence and bilateral interstitial prominence
consistent with CHF. Findings have worsened slightly from prior exam
. Pneumonitis cannot be excluded. No prominent pleural effusion.
Small left pneumothorax is again noted without significant change.
IMPRESSION: 1. Tracheostomy tube in stable position.

2.  Small left-sided pneumothorax noted without significant change.

3. Cardiomegaly with pulmonary vascular prominence and interstitial
prominence suggesting CHF. Pneumonitis cannot be excluded. These
findings have worsened slightly from prior exam.

## 2018-01-21 IMAGING — CR DG CHEST 1V PORT
1 series · 1 of 1 positions shown · non-contrast
Comparison: 12/11/2016

CLINICAL DATA: Check tracheostomy placement

EXAM:
PORTABLE CHEST 1 VIEW

[AP]
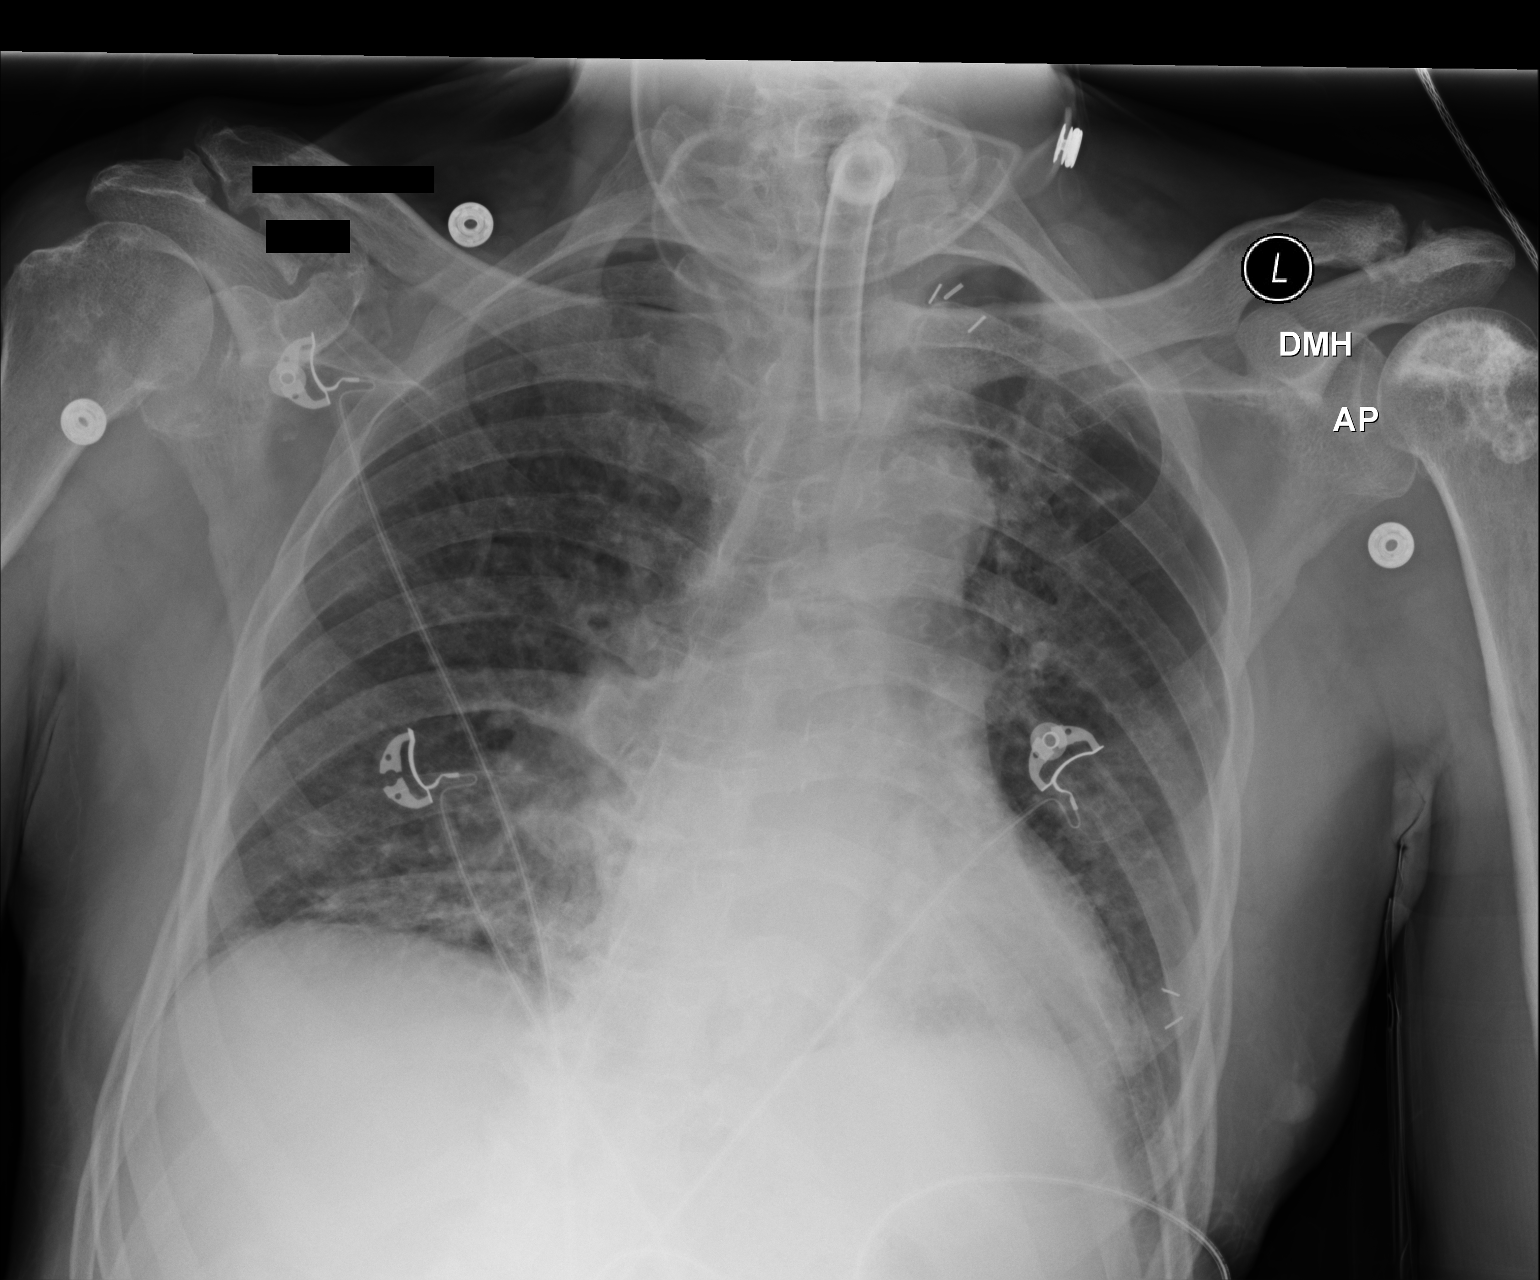

[1 of 1 positions shown; findings below may reference images not displayed]

FINDINGS: Tracheostomy tube is again noted in satisfactory position. Cardiac
shadow is stable. Left pneumothorax is again identified and stable.
Patchy right basilar infiltrate is noted. No acute bony abnormality
is seen. Chronic changes in the shoulder joints are noted.
IMPRESSION: Stable left apical pneumothorax.

Mild patchy changes in the right base.

## 2018-01-22 IMAGING — CR DG CHEST 1V PORT
1 series · 1 of 1 positions shown · non-contrast
Comparison: Portable chest x-ray December 12, 2016

CLINICAL DATA: Tracheostomy patient. Follow-up left apical
pneumothorax.

EXAM:
PORTABLE CHEST 1 VIEW

[AP]
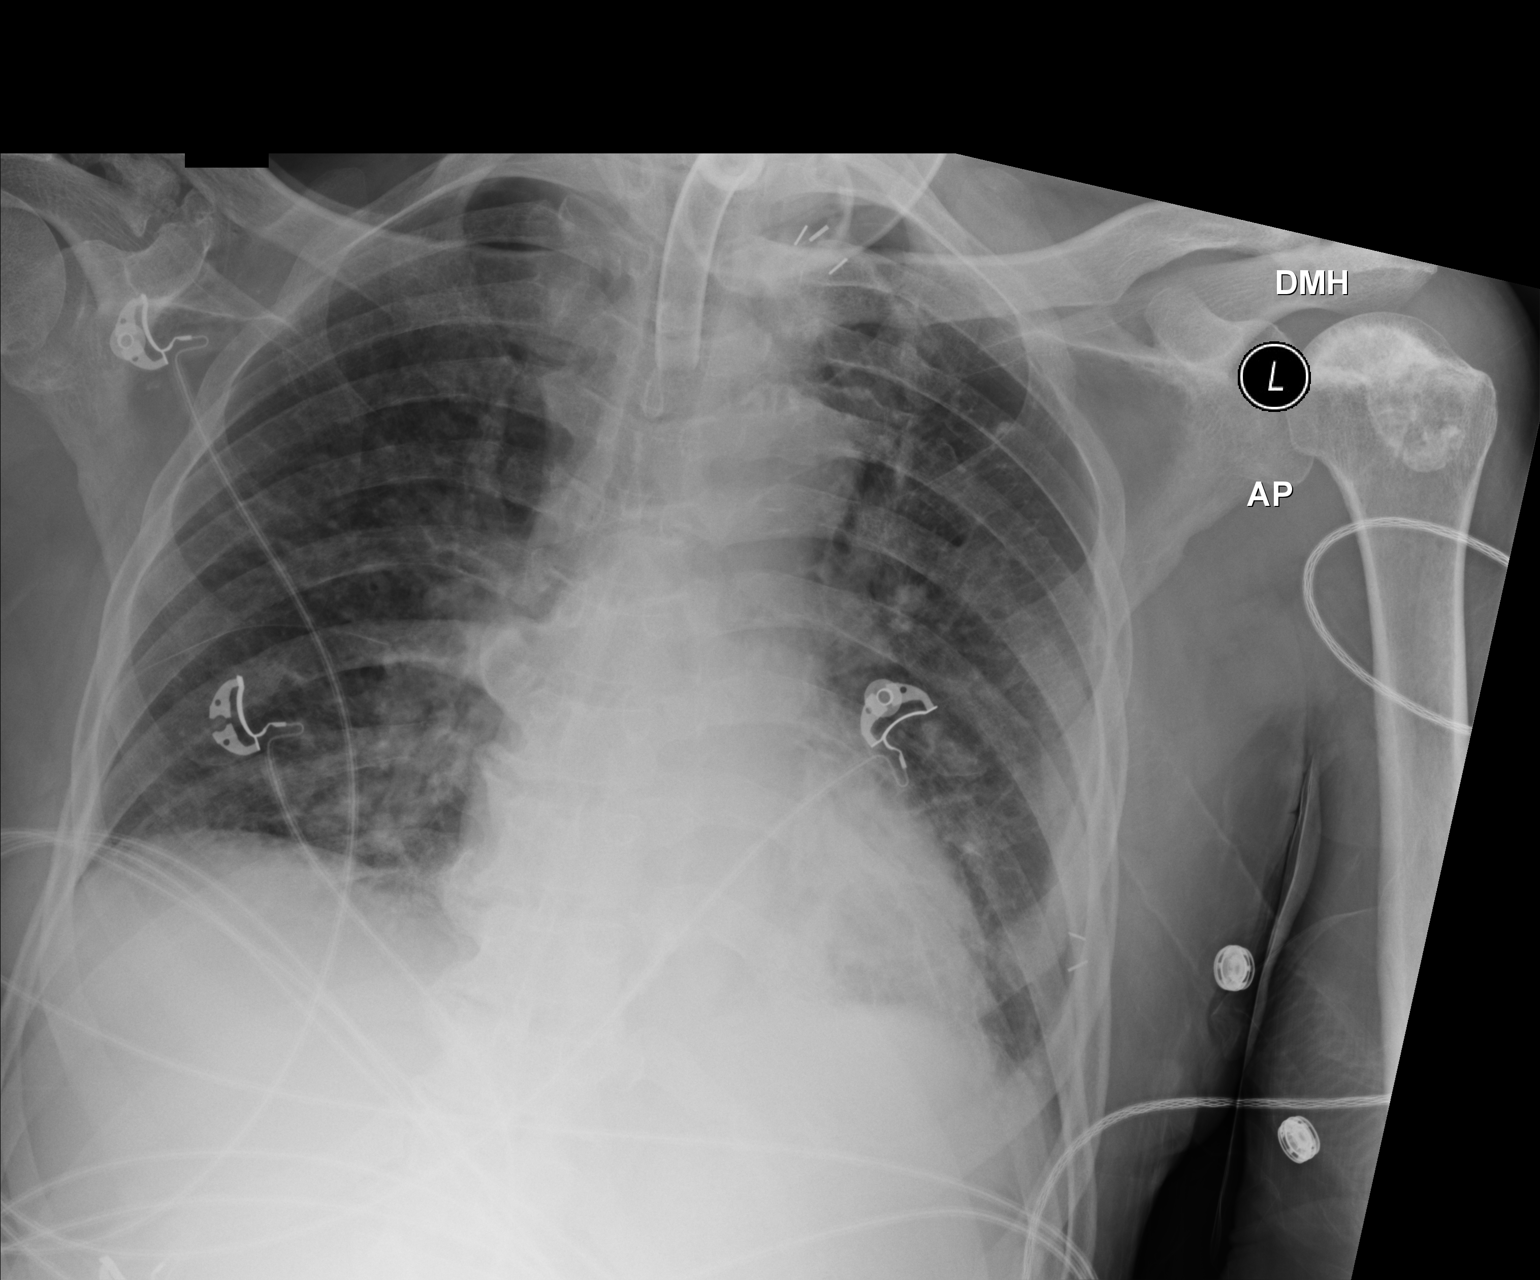

[1 of 1 positions shown; findings below may reference images not displayed]

FINDINGS: There is a 5% or less apical pneumothorax on the left which appears
stable. 3 surgical clips project over the left apex and also are
unchanged in position. There is no pleural effusion. There is patchy
density in the retrocardiac region on the left and in the right
infrahilar region. No right-sided pneumothorax or pleural effusion
is observed. The heart and pulmonary vascularity are normal. The
tracheostomy tube tip projects at the inferior margin of the
clavicular heads.
IMPRESSION: Stable 5% or less left apical pneumothorax. Persistent bibasilar
atelectasis or pneumonia.

## 2018-01-23 IMAGING — DX DG CHEST 1V PORT
1 series · 1 of 1 positions shown · non-contrast
Comparison: 12/13/2016, 12/12/2016 and earlier.

CLINICAL DATA: Chronic ventilator dependent respiratory failure.
Follow-up pneumothorax and follow-up bibasilar atelectasis versus
pneumonia.

EXAM:
PORTABLE CHEST 1 VIEW

[chest ap]
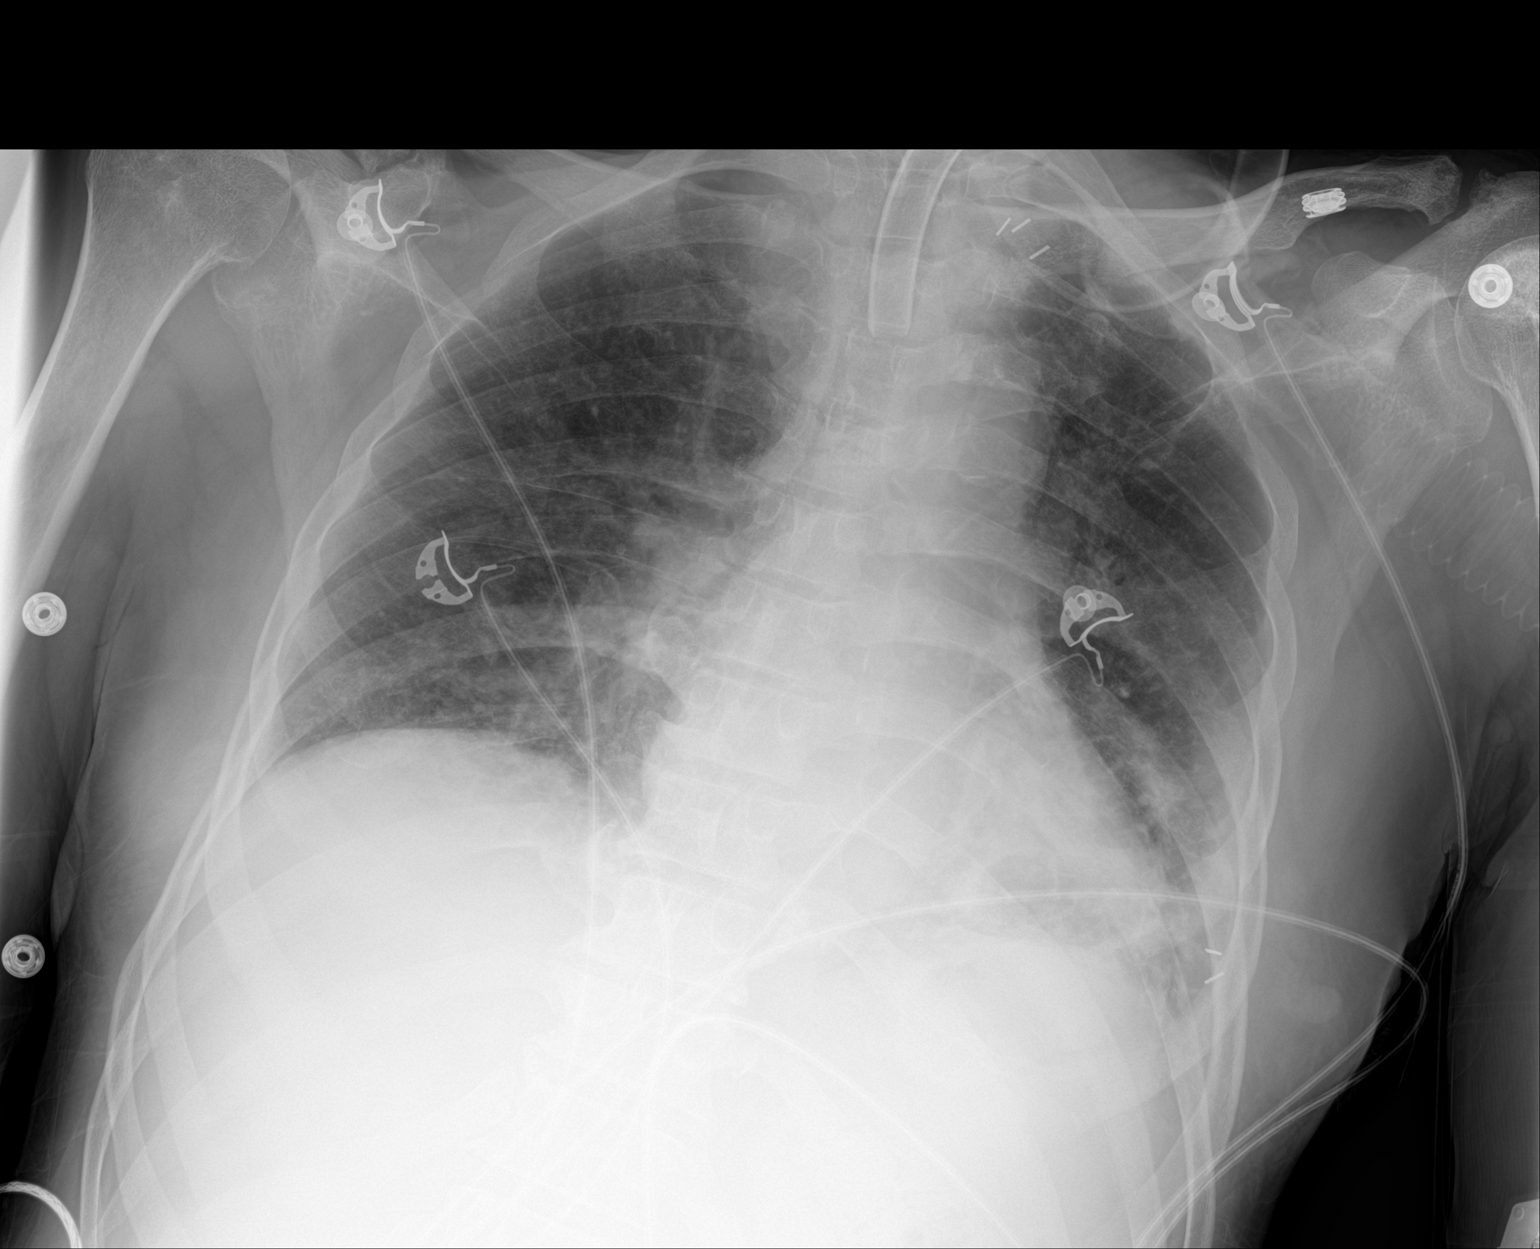

[1 of 1 positions shown; findings below may reference images not displayed]

FINDINGS: Tracheostomy tube tip in satisfactory position below the thoracic
inlet. Cardiac silhouette normal in size for AP portable technique.
No visible residual left apical pneumothorax. Persistent airspace
opacities in the left lung base. Improved aeration in the right lung
base with only mild atelectasis persisting. No new pulmonary
parenchymal abnormalities. Normal pulmonary vascularity. Dystrophic
calcification in the right coracoclavicular ligament as noted
previously.
IMPRESSION: 1. Resolution of the previously identified left apical pneumothorax.
2. Stable pneumonia involving the left lung base.
3. Improved aeration in the right lung base with only mild
atelectasis persisting.
4. No new abnormalities.

## 2018-01-23 IMAGING — DX DG CHEST 1V PORT
1 series · 1 of 1 positions shown · non-contrast
Comparison: 12/14/2016, 12/13/2016, 12/12/2016

CLINICAL DATA: Acute respiratory distress

EXAM:
PORTABLE CHEST 1 VIEW

[chest ap]
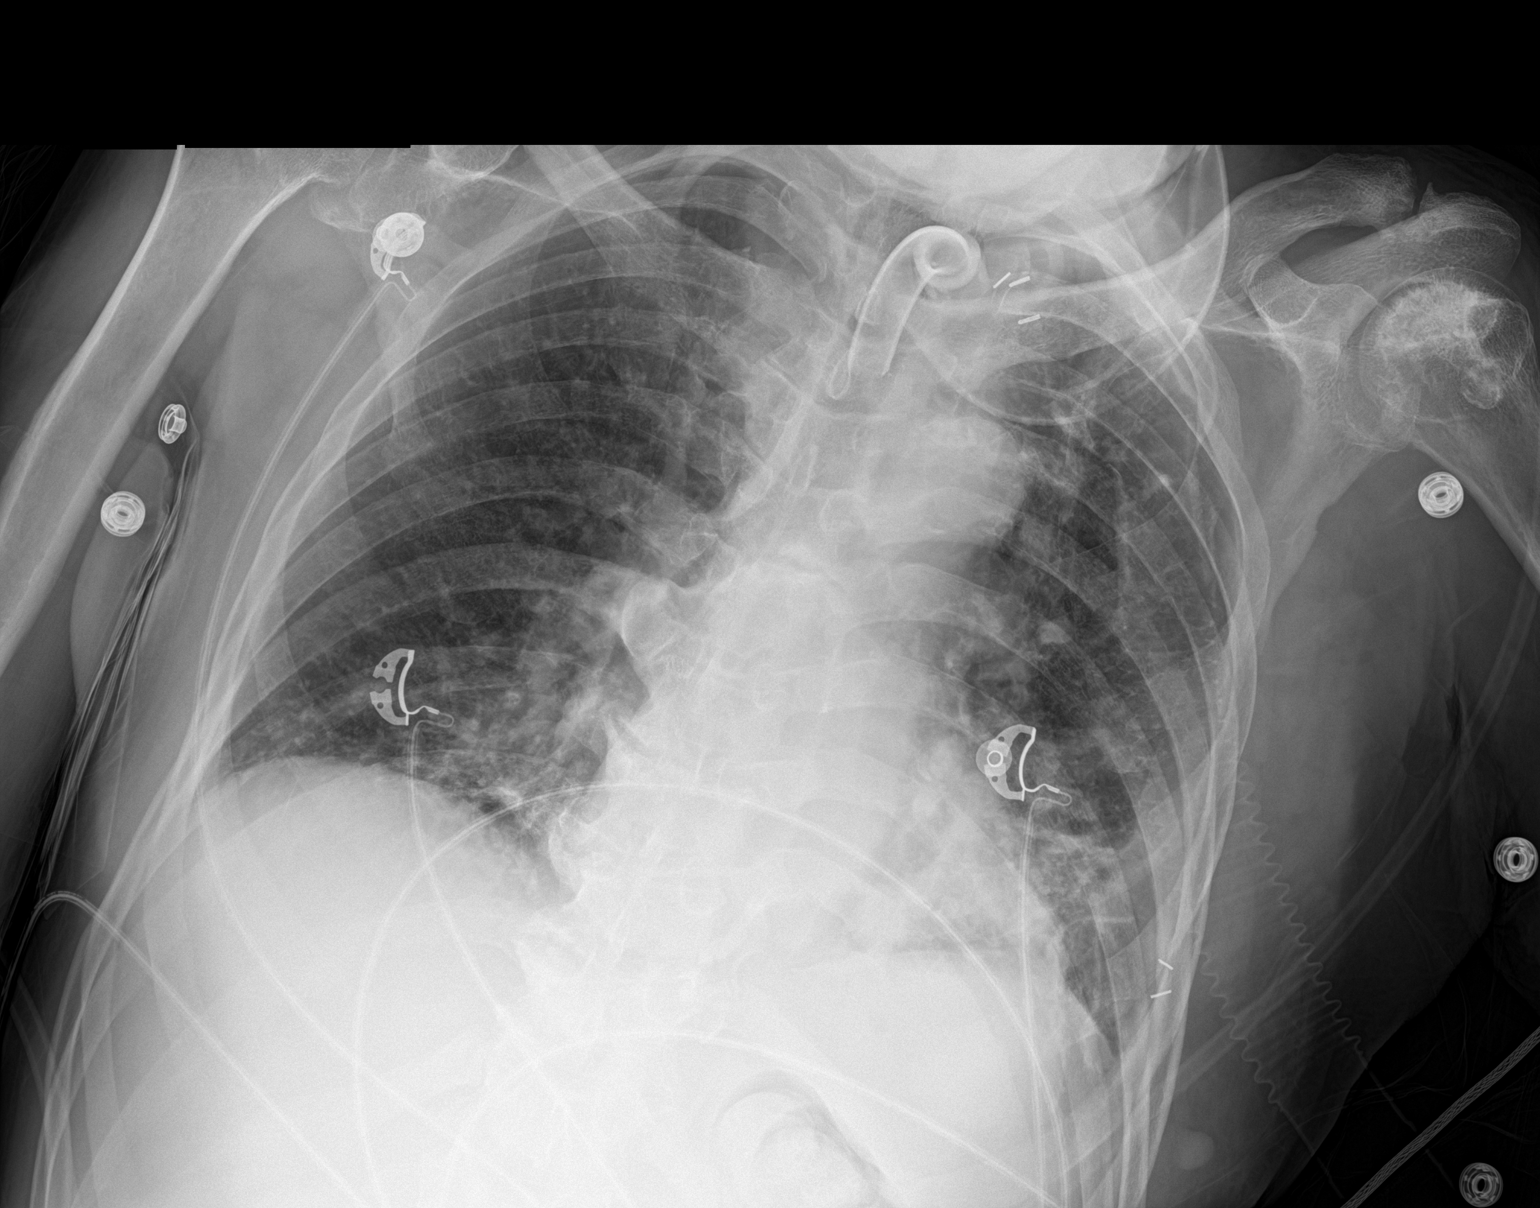

[1 of 1 positions shown; findings below may reference images not displayed]

FINDINGS: Tracheostomy tube is in place. Continued patchy infiltrate at the
left lung base. Suspect tiny left effusion. Minimal atelectasis or
infiltrate at the right base unchanged. Stable cardiomediastinal
silhouette. No definitive pneumothorax is seen
IMPRESSION: 1. Continued patchy infiltrates at the left greater than right lung
base with suspected tiny left effusion.

## 2018-01-25 IMAGING — DX DG CHEST 1V PORT
1 series · 1 of 1 positions shown · non-contrast
Comparison: 12/15/2007

CLINICAL DATA: Fever, tracheostomy

EXAM:
PORTABLE CHEST 1 VIEW

[chest ap]
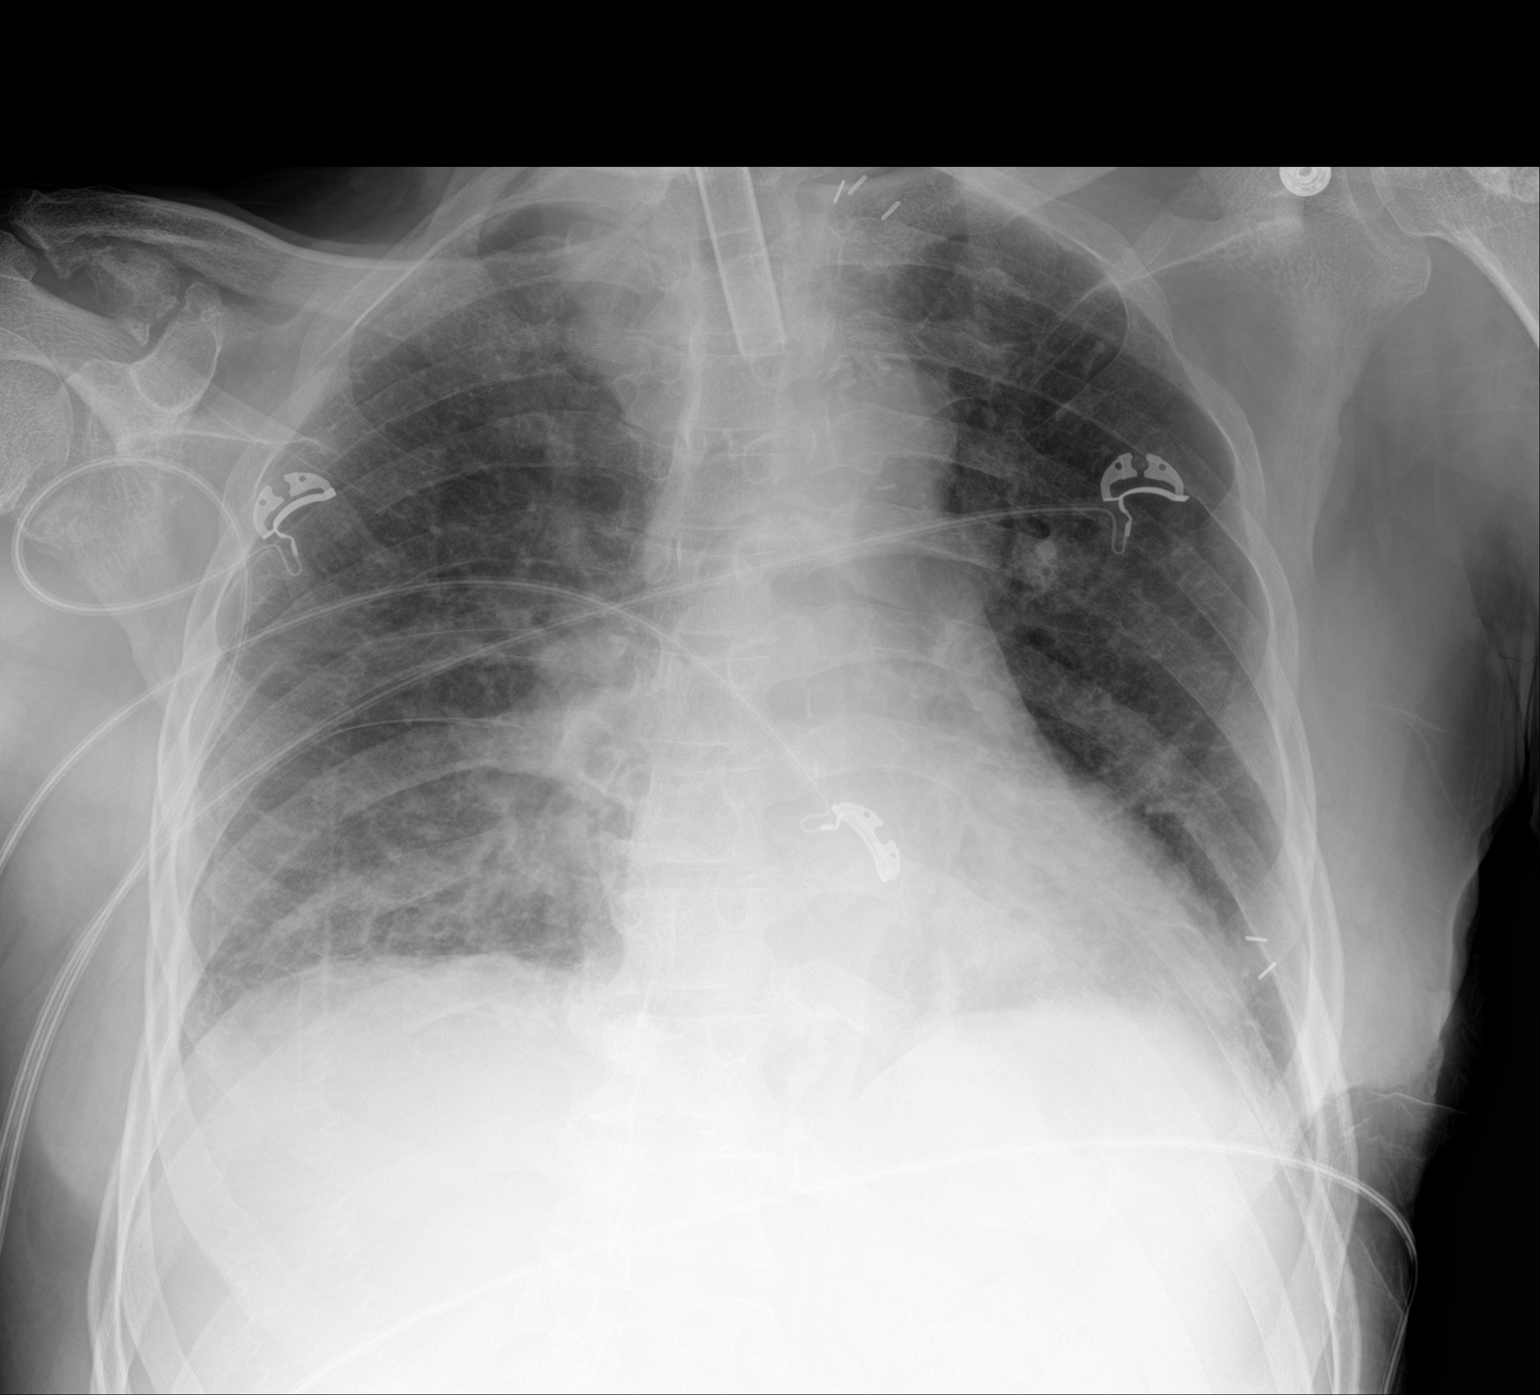

[1 of 1 positions shown; findings below may reference images not displayed]

FINDINGS: Stable tracheostomy in the mid trachea. Normal heart size and
vascularity. Mild bibasilar ill-defined patchy opacities as before.
Little interval change. No new collapse or consolidation. No
superimposed edema, effusion or pneumothorax. Atherosclerosis of
aorta.
IMPRESSION: Stable mild bibasilar airspace process versus atelectasis. No
significant interval change.

## 2018-01-31 IMAGING — DX DG CHEST 1V PORT
1 series · 1 of 1 positions shown · non-contrast
Comparison: 12/16/2016

CLINICAL DATA: Short of breath

EXAM:
PORTABLE CHEST 1 VIEW

[chest ap]
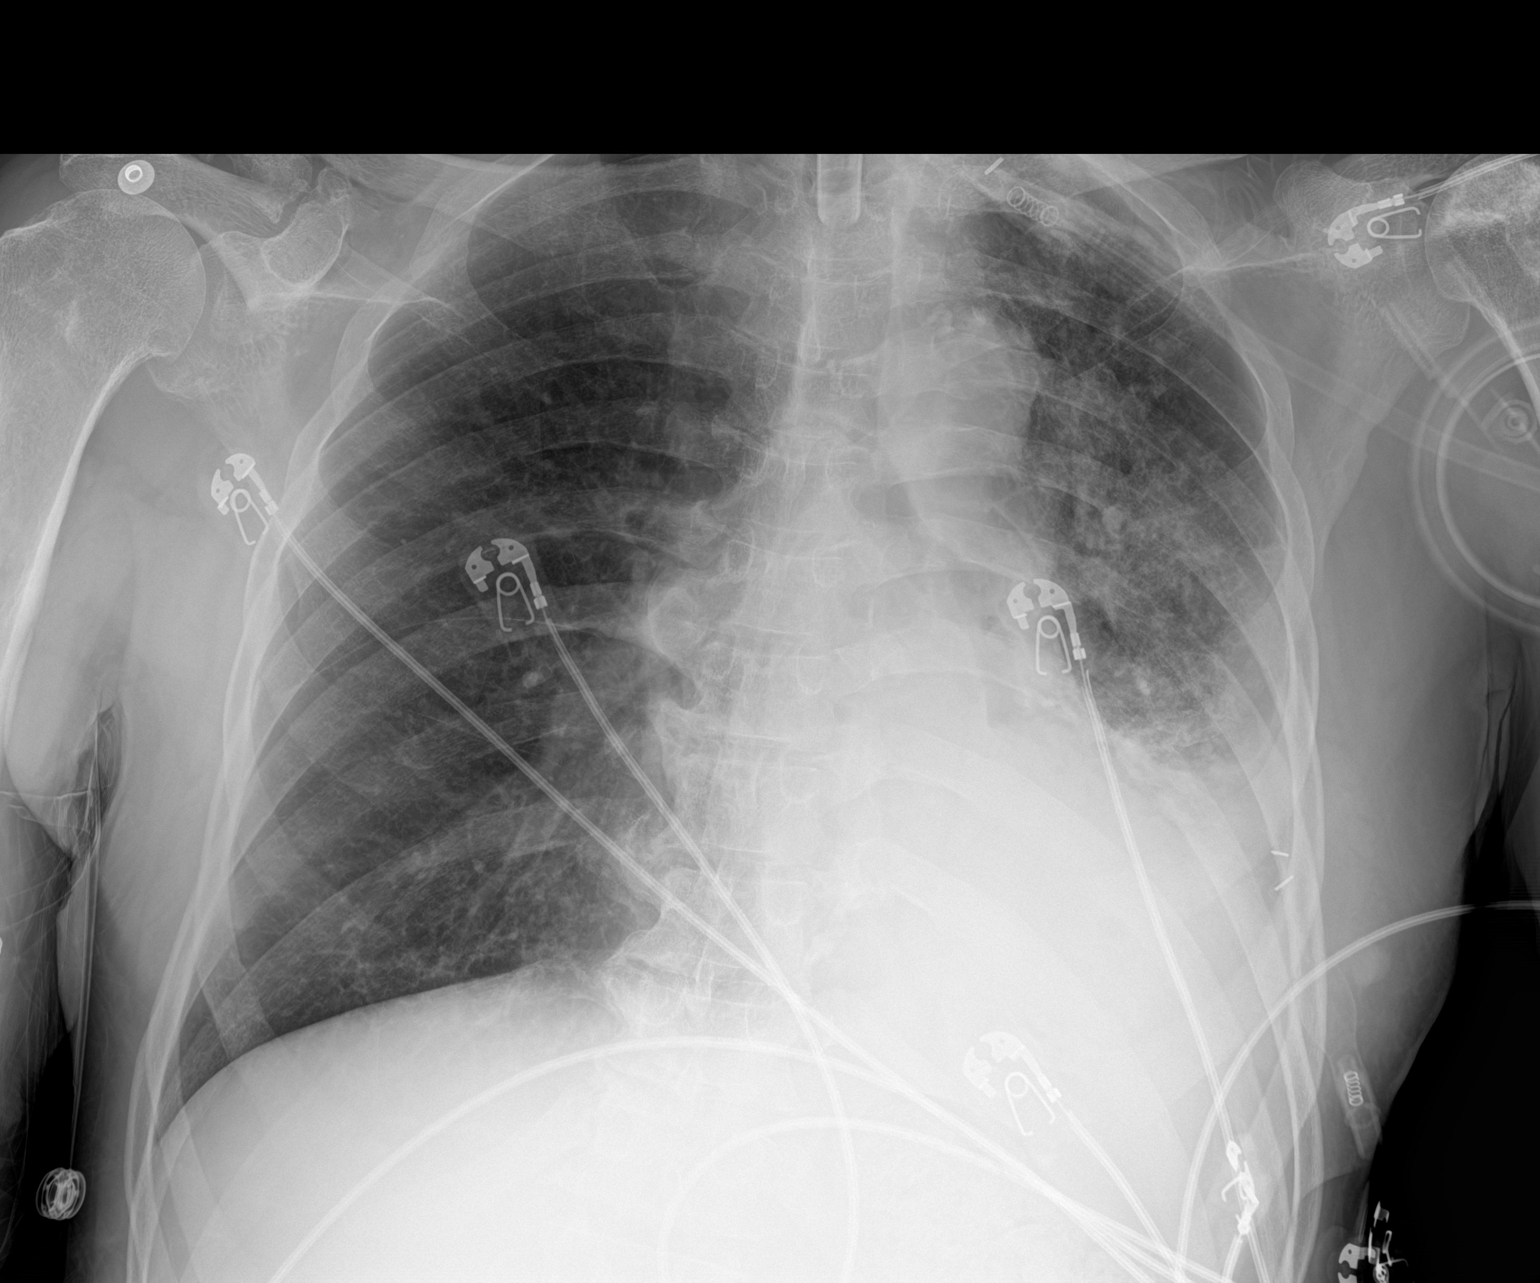

[1 of 1 positions shown; findings below may reference images not displayed]

FINDINGS: Tracheostomy in good position. Progressive consolidation left lung
base since the prior study. Probable left effusion as well as volume
loss. Left lower lobe bronchus cut off likely due to mucous plug or
secretions.

Improved aeration in the right lung base since the prior study.
Right lung now clear
IMPRESSION: Progressive collapse of the left lower lobe. Probable secretions or
mucous plugging left lower lobe bronchus. Left effusion is present.
Left lower lobe pneumonia not excluded.

## 2018-02-01 IMAGING — CR DG CHEST 1V PORT
1 series · 1 of 1 positions shown · non-contrast
Comparison: 12/22/2016

CLINICAL DATA: Left lower lobe aspiration with mucous plugging.

EXAM:
PORTABLE CHEST 1 VIEW

[AP]
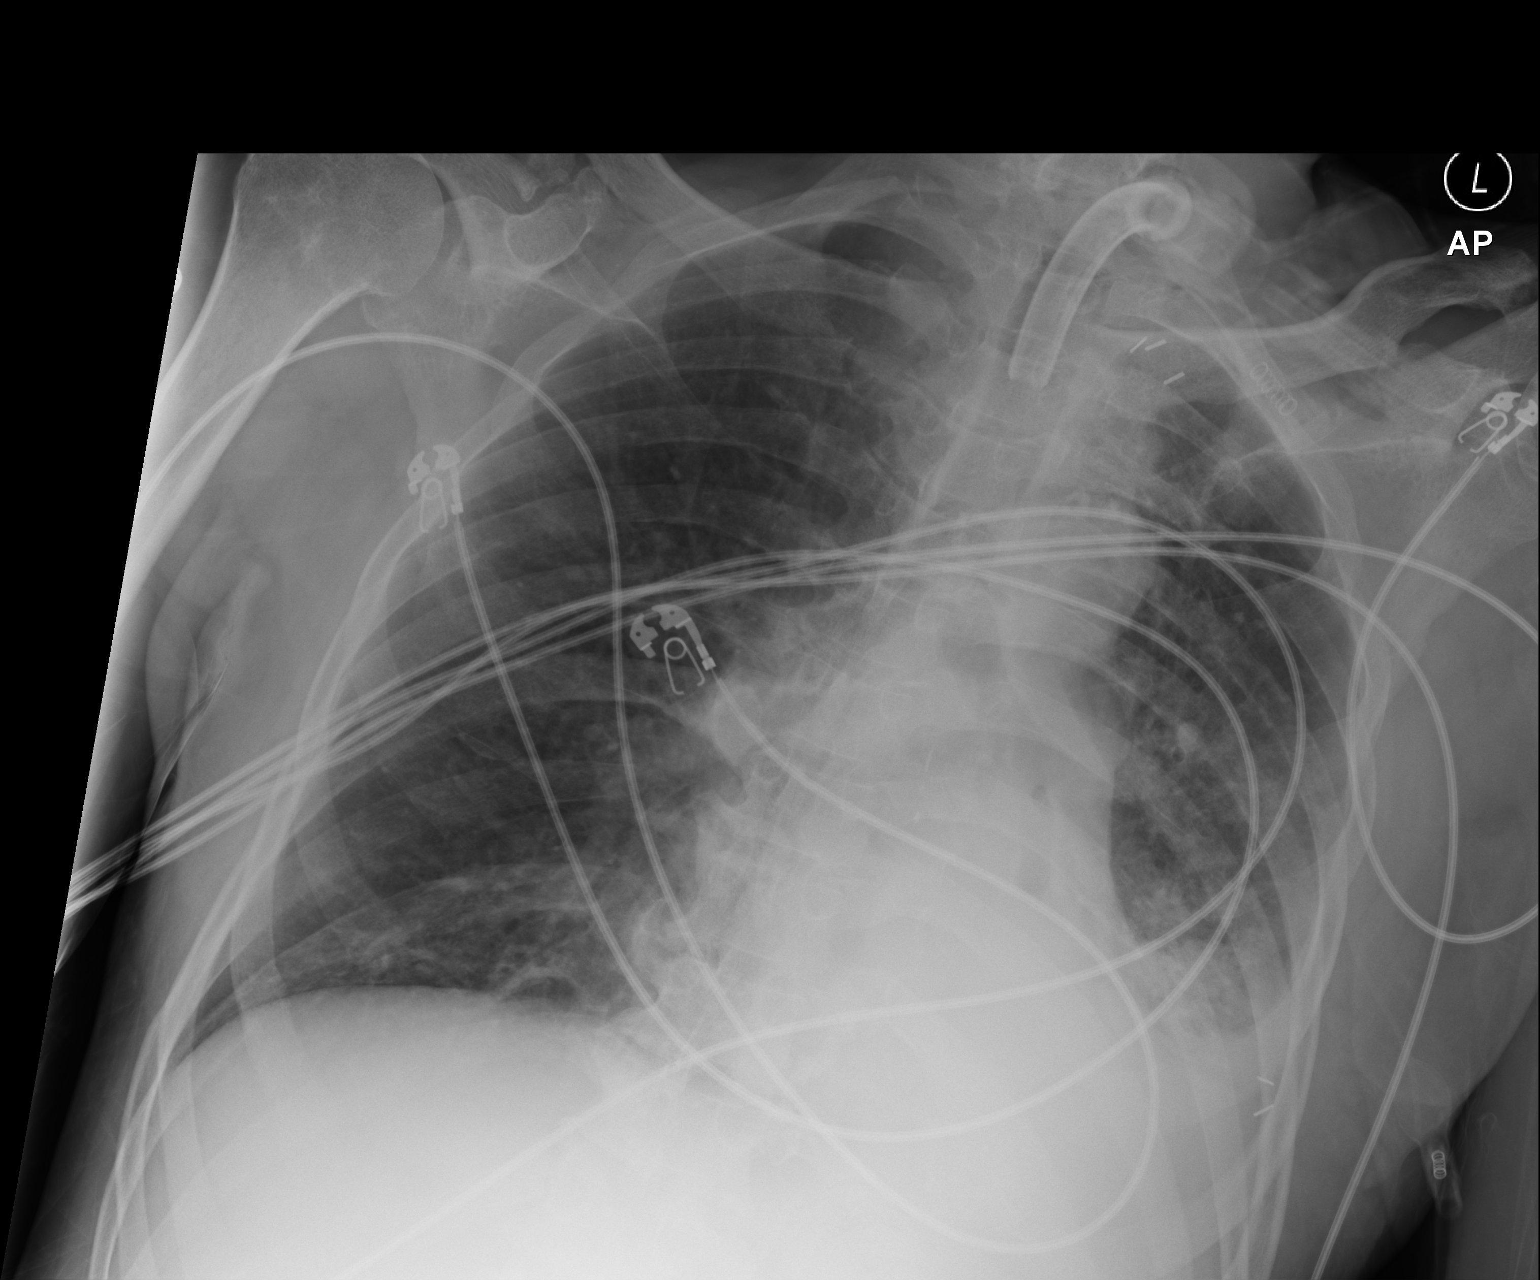

[1 of 1 positions shown; findings below may reference images not displayed]

FINDINGS: Endotracheal tube remains in place without change. Heart size and
pulmonary vascularity are normal. Infiltrates in the left lung base
with volume loss likely representing pneumonia. Probable small left
pleural effusion. No significant change since previous study, lying
for differences in technique. Right lung is clear.
IMPRESSION: Consolidation and volume loss in the left lung base with small left
pleural effusion likely representing pneumonia. Endotracheal tube in
place.

## 2018-02-03 ENCOUNTER — Other Ambulatory Visit: Payer: Self-pay

## 2018-02-03 ENCOUNTER — Emergency Department (HOSPITAL_COMMUNITY)
Admission: EM | Admit: 2018-02-03 | Discharge: 2018-02-04 | Disposition: A | Discharge status: Home / Self Care | Payer: Medicare HMO | Attending: Emergency Medicine | Admitting: Emergency Medicine

## 2018-02-03 ENCOUNTER — Encounter (HOSPITAL_COMMUNITY): Payer: Self-pay

## 2018-02-03 ENCOUNTER — Emergency Department (HOSPITAL_COMMUNITY): Payer: Medicare HMO

## 2018-02-03 DIAGNOSIS — Z87891 Personal history of nicotine dependence: Secondary | ICD-10-CM | POA: Insufficient documentation

## 2018-02-03 DIAGNOSIS — N183 Chronic kidney disease, stage 3 (moderate): Secondary | ICD-10-CM | POA: Insufficient documentation

## 2018-02-03 DIAGNOSIS — Z7901 Long term (current) use of anticoagulants: Secondary | ICD-10-CM | POA: Diagnosis not present

## 2018-02-03 DIAGNOSIS — Z431 Encounter for attention to gastrostomy: Secondary | ICD-10-CM | POA: Diagnosis not present

## 2018-02-03 DIAGNOSIS — Z79899 Other long term (current) drug therapy: Secondary | ICD-10-CM | POA: Insufficient documentation

## 2018-02-03 DIAGNOSIS — Z7982 Long term (current) use of aspirin: Secondary | ICD-10-CM | POA: Insufficient documentation

## 2018-02-03 DIAGNOSIS — I129 Hypertensive chronic kidney disease with stage 1 through stage 4 chronic kidney disease, or unspecified chronic kidney disease: Secondary | ICD-10-CM | POA: Insufficient documentation

## 2018-02-03 DIAGNOSIS — T85528A Displacement of other gastrointestinal prosthetic devices, implants and grafts, initial encounter: Secondary | ICD-10-CM

## 2018-02-03 IMAGING — DX DG CHEST 1V PORT
1 series · 1 of 1 positions shown · non-contrast
Comparison: Two days prior

CLINICAL DATA: Respiratory failure

EXAM:
PORTABLE CHEST 1 VIEW

[chest ap]
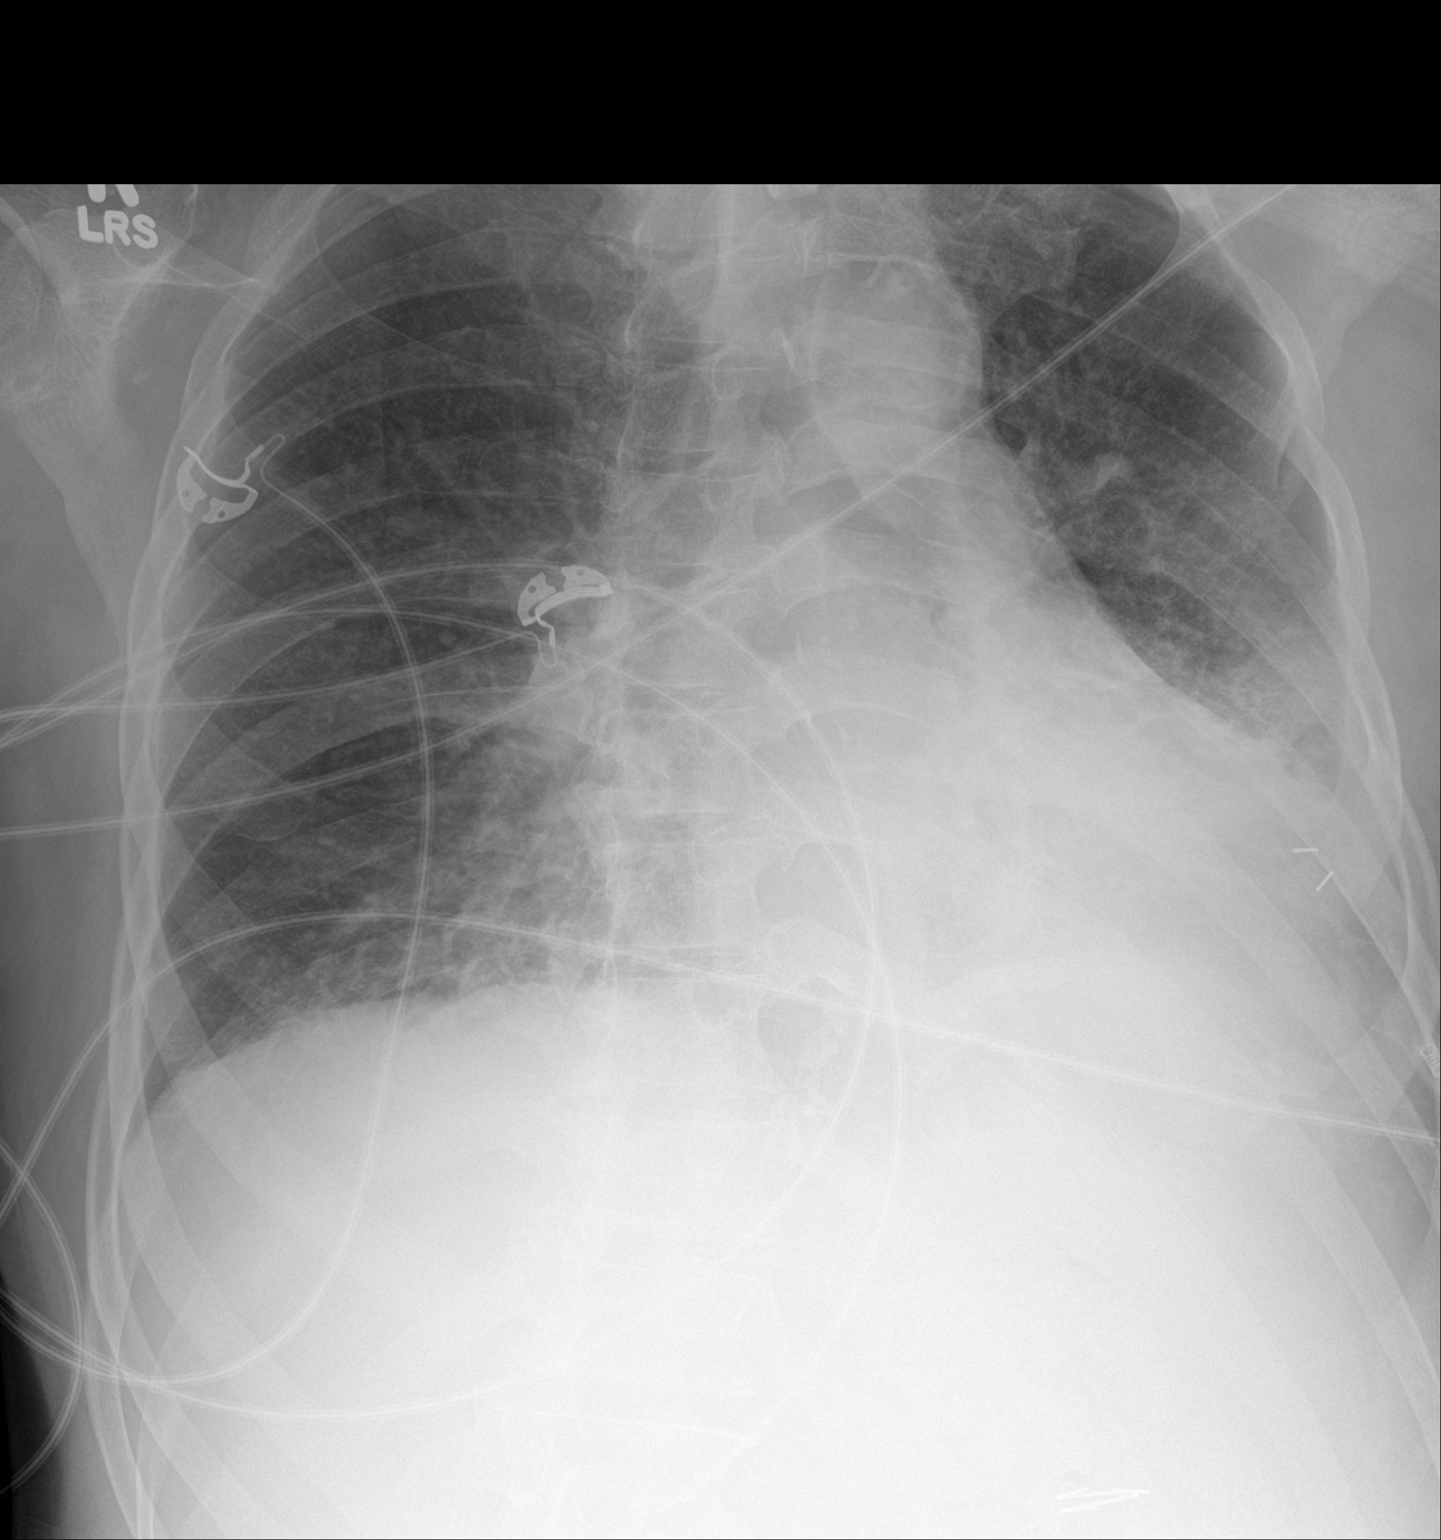

[1 of 1 positions shown; findings below may reference images not displayed]

FINDINGS: History of pneumonia with left more than right lung opacity at the
bases. There is some improvement in left base aeration, with better
visualized diaphragm. Grossly stable heart size and mediastinal
contours when accounting for rotation. No suspected edema, effusion,
or pneumothorax. Tracheostomy tube in place.
IMPRESSION: History of pneumonia with left more than right lung opacity.
Improved aeration at the left base with better visualized diaphragm.

## 2018-02-03 MED ORDER — IOPAMIDOL (ISOVUE-300) INJECTION 61%
INTRAVENOUS | Status: AC
  Administered 2018-02-03: 15 mL via GASTROSTOMY
  Filled 2018-02-03: qty 50

## 2018-02-03 NOTE — ED Notes (Signed)
Pt's sister aware that pt's g-tube is back in place and that pt is being discharged to home via [facility]

## 2018-02-03 NOTE — ED Provider Notes (Addendum)
[facility] EMERGENCY DEPARTMENT Provider Note   CSN: [id number] Arrival date & time: 02/03/18  2027     History   Chief Complaint No chief complaint on file.   HPI Scott Rivera is a 67 y.o. male.  Patient is a 67 year old male coming from home with a history of stroke status post tracheostomy and G-tube, hypertension, chronic kidney disease, recurrent aspiration pneumonia who is presenting today by EMS because he pulled out his G-tube at home.  Patient has had multiple replacements of the G-tube most recently 2 months ago.  He has had the tube placed for over a year.  No other complaints by family today.  Patient was seen in the emergency room in the middle of September and at that time had leaking from his G-tube.  He also had labs checked at that time that seemed to be relatively baseline.  Patient has no complaints today and shakes his head no when asked if he has any pain or shortness of breath.  The history is provided by the EMS personnel and a caregiver. The history is limited by the absence of a caregiver.    Past Medical History:  Diagnosis Date  . Anemia due to chronic kidney disease   . Aspiration pneumonia ([place])   . Chronic kidney disease, stage 3 (moderate) (HCC)   . Hypertension   . Stroke [facility])     Patient Active Problem List   Diagnosis Date Noted  . Acute respiratory failure with hypoxia and hypercarbia (HCC)   . Acute pulmonary edema (HCC)   . 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 Aspiration of gastric contents   . Malnutrition of moderate degree 11/19/2017  . Aspiration pneumonia ([place]) 11/16/2017  . Nausea and vomiting 11/16/2017  . Hyperkalemia 06/26/2017  . Hematuria 06/26/2017  . Bronchitis/??? Pneumonia 03/30/2017  . S/P percutaneous endoscopic gastrostomy (PEG) tube placement ([place]) 03/30/2017  . Chronic anemia 03/30/2017  . Foley catheter in place on admission 03/30/2017  . UTI (urinary tract infection) 03/30/2017  . MDR  Acinetobacter baumannii infection   . Gastrostomy tube obstruction ([place])   . PEG tube malfunction ([place])   . Acute respiratory failure with hypoxia ([place]) 03/02/2017  . Loculated pleural effusion   . Acute respiratory distress   . Fever   . Follow up   . HCAP (healthcare-associated pneumonia)   . Stage III pressure ulcer of sacral region ([place]) 12/17/2016  . Hypoxemia   . H/O tracheostomy   . Pneumothorax   . Pneumothorax, closed, traumatic, initial encounter 12/10/2016  . Sepsis ([place]) 12/09/2016  . Hypotension 12/09/2016  . Acute-on-chronic kidney injury ([place]) 12/09/2016  . Elevated troponin 12/09/2016  . Anemia due to chronic kidney disease 12/09/2016  . Compensated metabolic alkalosis 40/98/1191  . Pneumonia   . Goals of care, counseling/discussion   . Palliative care by specialist   . Respiratory failure ([place])   . Aspiration into airway   . Septic shock ([place])   . Dysphagia, post-stroke   . Benign essential HTN   . Tobacco abuse   . Tachypnea   . Hyperglycemia   . Hypernatremia   . Leukocytosis (leucocytosis)   . Acute blood loss anemia   . Chronic kidney disease   . Pressure injury of skin 10/20/2016  . Cerebellar infarct ([place]) 10/19/2016  . ARF (acute renal failure) ([place]) 10/19/2016  . Essential hypertension 10/19/2016  . Rhabdomyolysis 10/19/2016  . History of completed stroke 10/19/2016  . Bilateral chronic knee pain 01/07/2016  .  History of adenomatous polyp of colon 07/28/2015  . Hyperlipidemia 07/28/2013  . Helicobacter pylori infection 09/21/2012    Past Surgical History:  Procedure Laterality Date  . IR GASTROSTOMY TUBE MOD SED  10/27/2016  . IR PATIENT EVAL TECH 0-60 MINS  01/05/2017  . IR PATIENT EVAL TECH 0-60 MINS  01/09/2017  . IR PATIENT EVAL TECH 0-60 MINS  03/03/2017  . IR PATIENT EVAL TECH 0-60 MINS  04/03/2017  . IR REPLACE G-TUBE SIMPLE WO FLUORO  11/20/2017  . IR [place] TUBE PERCUT W/FLUORO  07/23/2017  . KNEE SURGERY    .  TRACHEOSTOMY TUBE PLACEMENT          Home Medications    Prior to Admission medications   Medication Sig Start Date End Date Taking? Authorizing Provider  acetaminophen (TYLENOL) 500 MG tablet Place 1,000 mg into feeding tube 3 (three) times daily.     [provider]  Amino Acids-Protein Hydrolys (FEEDING SUPPLEMENT, PRO-STAT SUGAR FREE 64,) LIQD Take 30 mLs by mouth at bedtime.    [provider]  amLODipine (NORVASC) 10 MG tablet Place 1 tablet (10 mg total) into feeding tube daily. 11/23/17   Kayleen Memos, DO  aspirin 81 MG chewable tablet Place 1 tablet (81 mg total) into feeding tube daily. 04/05/17   Nita Sells, MD  atropine 1 % ophthalmic solution Place 2 drops under the tongue every 8 (eight) hours as needed (excessive secretion). Patient taking differently: Place 2 drops under the tongue 2 (two) times daily.  07/03/17   Lavina Hamman, MD  bisacodyl (DULCOLAX) 10 MG suppository Place 1 suppository (10 mg total) rectally daily. 12/22/17   Thurnell Lose, MD  cloNIDine (CATAPRES - DOSED IN MG/24 HR) 0.1 mg/24hr patch Place 1 patch (0.1 mg total) onto the skin once a week. Patient taking differently: Place 0.1 mg onto the skin every Tuesday.  04/10/17   Nita Sells, MD  clopidogrel (PLAVIX) 75 MG tablet Place 1 tablet (75 mg total) into feeding tube daily. 01/11/17   Cherene Altes, MD  gabapentin (NEURONTIN) 100 MG capsule 100 mg See admin instructions. Open 1 capsule (100 mg) and administer contents via feeding tube three times daily    [provider]  ipratropium-albuterol (DUONEB) 0.5-2.5 (3) MG/3ML SOLN Take 3 mLs by nebulization every 4 (four) hours as needed. Patient taking differently: Take 3 mLs by nebulization every 4 (four) hours as needed (shortness of breath/wheezing).  01/10/17   Cherene Altes, MD  metoprolol tartrate (LOPRESSOR) 50 MG tablet Take 1 tablet (50 mg total) by mouth 2 (two) times daily. 12/21/17   Thurnell Lose, MD  montelukast (SINGULAIR) 10 MG tablet Place 10 mg into feeding tube at bedtime.     [provider]  mouth rinse LIQD solution 10 mLs by Mouth Rinse route 4 (four) times daily.    [provider]  Nutritional Supplements (FEEDING SUPPLEMENT, JEVITY 1.2 CAL,) LIQD Place 1,000 mLs into feeding tube continuous. 11/22/17   Kayleen Memos, DO  pantoprazole (PROTONIX) 40 MG tablet Take 1 tablet (40 mg total) by mouth daily. 12/21/17   Thurnell Lose, MD  pravastatin (PRAVACHOL) 20 MG tablet Place 1 tablet (20 mg total) into feeding tube daily at 6 PM. Patient taking differently: Place 20 mg daily into feeding tube.  01/10/17   Cherene Altes, MD  ranitidine (ZANTAC) 150 MG/10ML syrup Place 10 mLs (150 mg total) into feeding tube daily. Patient not taking: Reported  on 12/18/2017 11/23/17   Kayleen Memos, DO  senna (SENOKOT) 8.6 MG TABS tablet Take 1 tablet by mouth daily.    [provider]  thiamine 100 MG tablet Place 1 tablet (100 mg total) into feeding tube daily. 01/11/17   Cherene Altes, MD  Water For Irrigation, Sterile (FREE WATER) SOLN Place 100 mLs into feeding tube daily. Patient taking differently: Place 100 mLs into feeding tube every 3 (three) hours.  07/03/17   Lavina Hamman, MD    Family History Family History  Problem Relation Age of Onset  . Diabetes Mellitus II Brother   . CAD Neg Hx   . Stroke Neg Hx     Social History Social History   Tobacco Use  . Smoking status: Former Research scientist (life sciences)  . Smokeless tobacco: Never Used  . Tobacco comment: Smoked heavily until the day of his stroke  Substance Use Topics  . Alcohol use: Not Currently    Comment: Drank heavily prior to stroke per sister  . Drug use: Not Currently     Allergies   Patient has no known allergies.   Review of Systems Review of Systems  Unable to perform ROS: Patient nonverbal     Physical Exam Updated Vital Signs BP 120/71 (BP Location: Right Arm)   Pulse  81   Temp 97.9 F (36.6 C) (Oral)   Resp 12   Ht 6' (1.829 m)   Wt 74.1 kg   SpO2 90%   BMI 22.16 kg/m   Physical Exam  Constitutional: He appears well-developed and well-nourished. No distress.  HENT:  Head: Normocephalic and atraumatic.  Mouth/Throat: Oropharynx is clear and moist.  Eyes: Pupils are equal, round, and reactive to light. Conjunctivae and EOM are normal.  Neck:  Trach in place with mucus secretions  Cardiovascular: Normal rate and regular rhythm.  Pulmonary/Chest: Effort normal and breath sounds normal. No respiratory distress.  Abdominal: Soft. He exhibits no distension. There is no tenderness. There is no rebound and no guarding.  PEG tube site with some mild bleeding but no surrounding erythema  Musculoskeletal:  Significant joint deformities of the knees.  Soft boots present on both feet  Neurological: He is alert.  Skin: Skin is warm and dry. No rash noted. No erythema.  Psychiatric:  nonverbal  Nursing note and vitals reviewed.    ED Treatments / Results  Labs (all labs ordered are listed, but only abnormal results are displayed) Labs Reviewed - No data to display  EKG None  Radiology Dg Abdomen Peg Tube Location  Result Date: 02/03/2018 CLINICAL DATA:  G-tube placement EXAM: ABDOMEN - 1 VIEW COMPARISON:  12/21/2017 FINDINGS: Gastrostomy tube projects over the left abdomen. Injected contrast opacifies the distal stomach and proximal duodenum. No contrast extravasation. No evidence of bowel obstruction. IMPRESSION: Gastrostomy tube in the distal stomach.  No contrast extravasation. Electronically Signed   By: Rolm Baptise M.D.   On: 02/03/2018 22:25    Procedures Gastrostomy tube replacement Date/Time: 02/03/2018 9:50 PM Performed by: Blanchie Dessert, MD Authorized by: Blanchie Dessert, MD  Consent: Verbal consent obtained. Consent given by: patient Patient understanding: patient states understanding of the procedure being  performed Patient identity confirmed: verbally with patient Local anesthesia used: no  Anesthesia: Local anesthesia used: no  Sedation: Patient sedated: no  Patient tolerance: Patient tolerated the procedure well with no immediate complications Comments: [id number] tube replaced without problems.    (including critical care time)  Medications Ordered in ED Medications - No  data to display   Initial Impression / Assessment and Plan / ED Course  I have reviewed the triage vital signs and the nursing notes.  Pertinent labs & imaging results that were available during my care of the patient were reviewed by me and considered in my medical decision making (see chart for details).     67 year old male with multiple medical problems presenting today because he pulled his G-tube out.  He otherwise appears to be stable.  Patient has a 50 [place] G-tube and will attempt to replace.  9:50 PM Tube replaced without problems.  KUB confirmed placement will d/c home.  10:26 PM X-ray shows good placement and pt will be d/ced home.  Spoke with Pt's legal guardian who is ok with him being sent home.  Final Clinical Impressions(s) / ED Diagnoses   Final diagnoses:  Dislodged gastrostomy tube [facility])    ED Discharge Orders    None       Blanchie Dessert, MD 02/03/18 2228    Blanchie Dessert, MD 02/03/18 2235

## 2018-02-03 NOTE — Progress Notes (Signed)
Rt called to suction pt per MD as RN stated.  Rt entered and suctioned pt with small clear/white secretions removed.  RT will continue to monitor.

## 2018-02-03 NOTE — ED Triage Notes (Signed)
Pt arrives to ED from home after pt pulled out his g-tube at [facility]. EMS reports family did not see him pull the tube out but that is the time they noticed it was out, unsure of size. Pt has trach and urinary catheter as well. Pt placed in position of comfort with bed locked and lowered, call bell in reach.

## 2018-02-03 NOTE — ED Notes (Signed)
Pulled pt back up in bed, updated we are still waiting on [facility] for transport

## 2018-02-03 NOTE — Progress Notes (Signed)
Pt home HME removed and placed in bag with patient meds.  Pt placed on 60% ATC O2 sats 92%.  RT will continue to monitor.

## 2018-02-04 NOTE — ED Notes (Signed)
[facility] at bedside to pickup pt

## 2018-02-07 IMAGING — CT CT CHEST W/O CM
2 of 5 series · 15 of 36 positions shown, 18 images · non-contrast
Comparison: Noncontrast CT Abdomen and Pelvis 10/26/2016.
Noncontrast Chest CT 07/09/2014.

CLINICAL DATA: 66-year-old male with shortness of breath. Pleural
effusion. Possible pneumonia.

EXAM:
CT CHEST WITHOUT CONTRAST
TECHNIQUE: Multidetector CT imaging of the chest was performed following the
standard protocol without IV contrast.

[Series 5: thins · axial · 0.73mm/px · z∈[+1158,+1438]mm · 12 of 399 slices shown, 15 images]
[im 24/399  mediastinal]
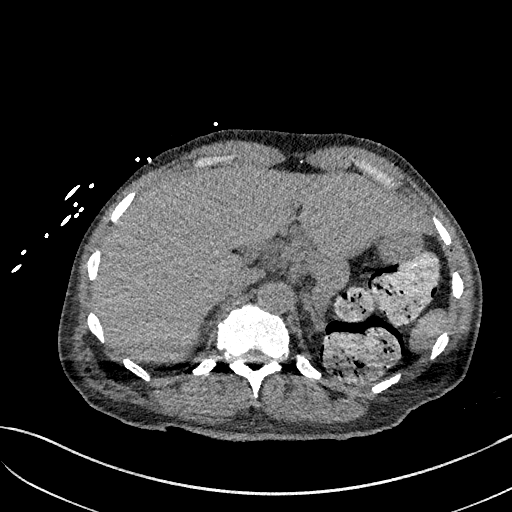
[im 24/399  lung]
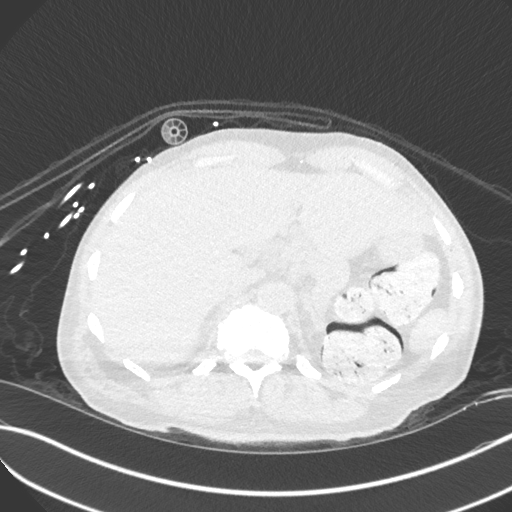
[im 71/399  lung]
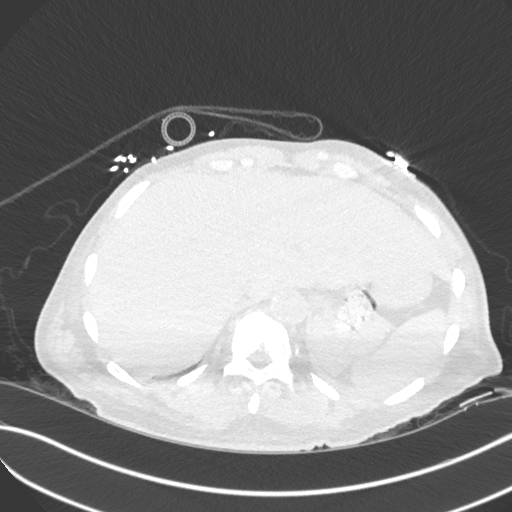
[im 94/399  lung]
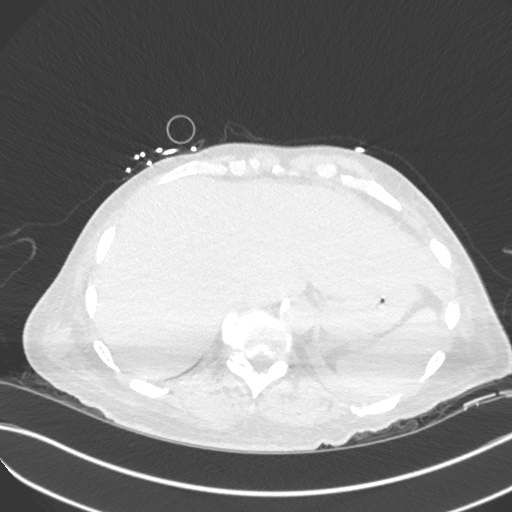
[im 118/399  lung]
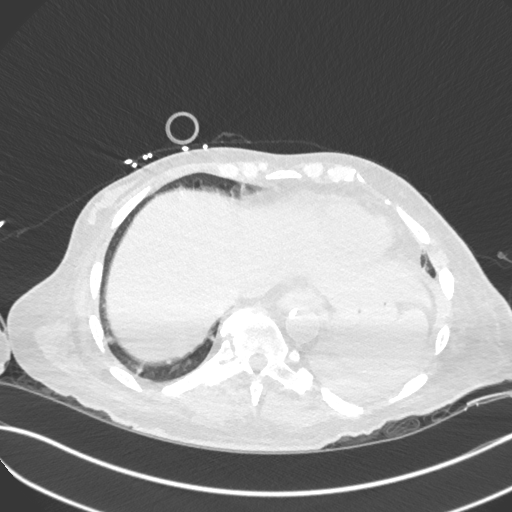
[im 164/399  mediastinal]
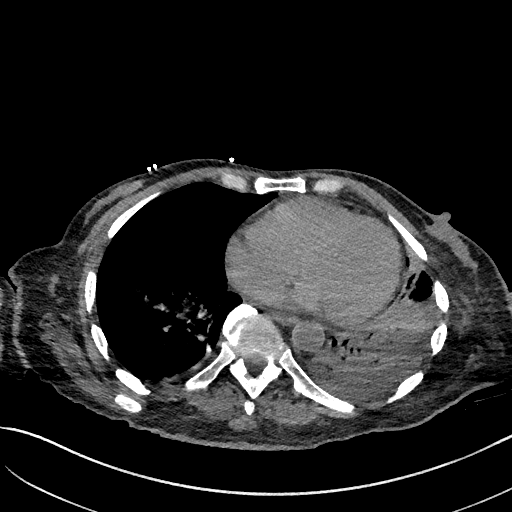
[im 164/399  lung]
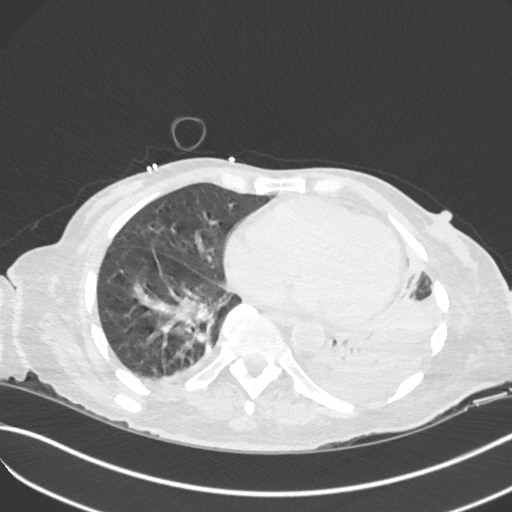
[im 188/399  lung]
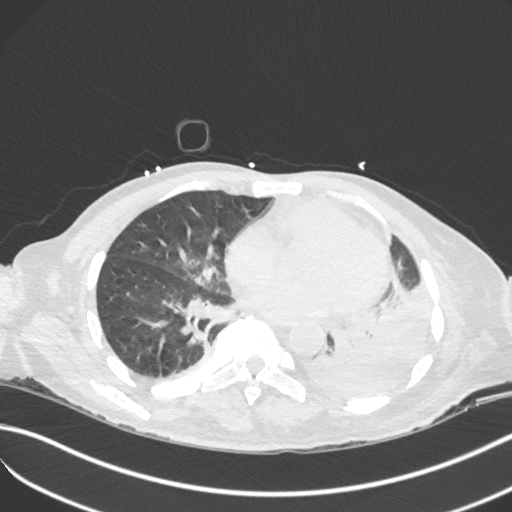
[im 211/399  lung]
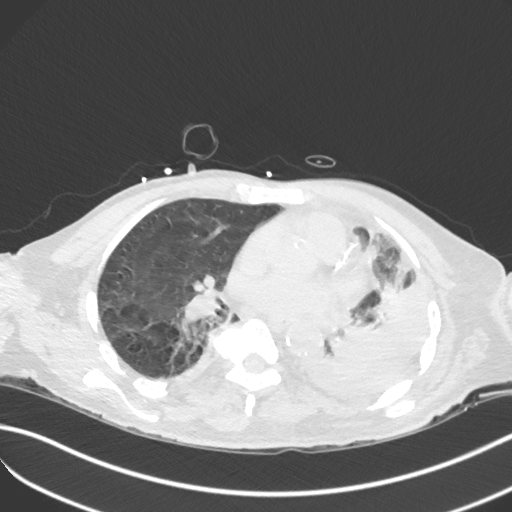
[im 258/399  lung]
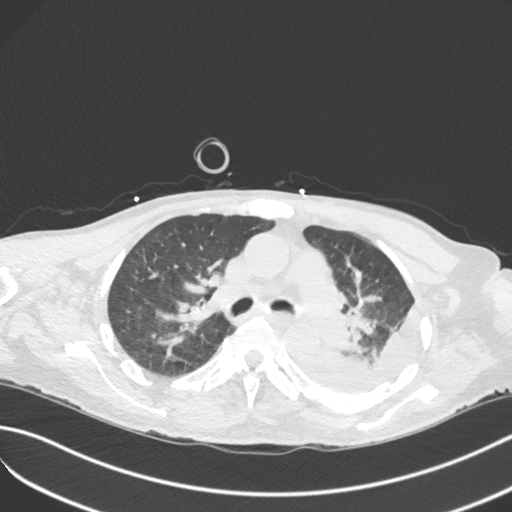
[im 281/399  mediastinal]
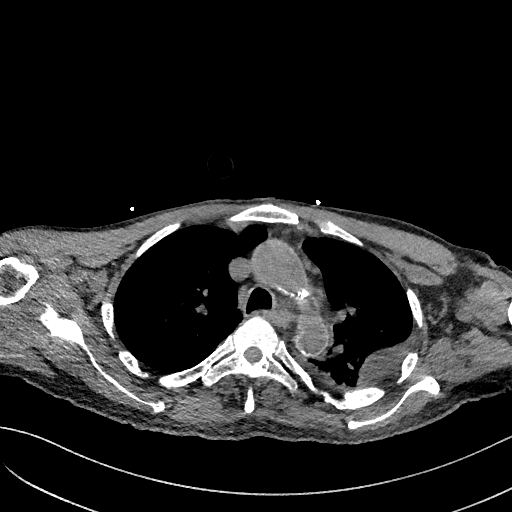
[im 281/399  lung]
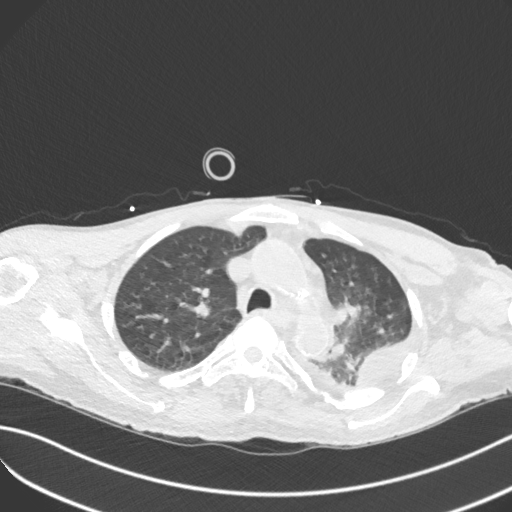
[im 305/399  lung]
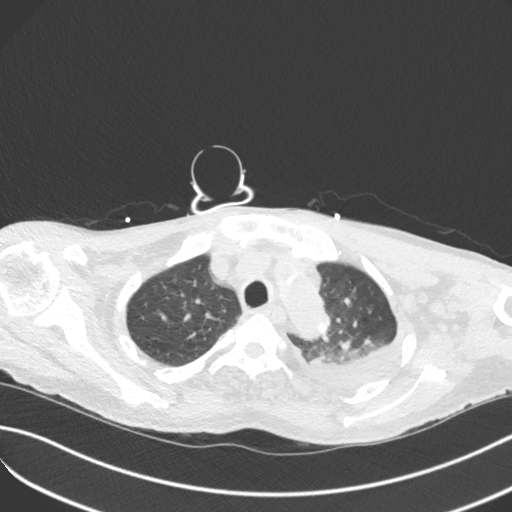
[im 352/399  lung]
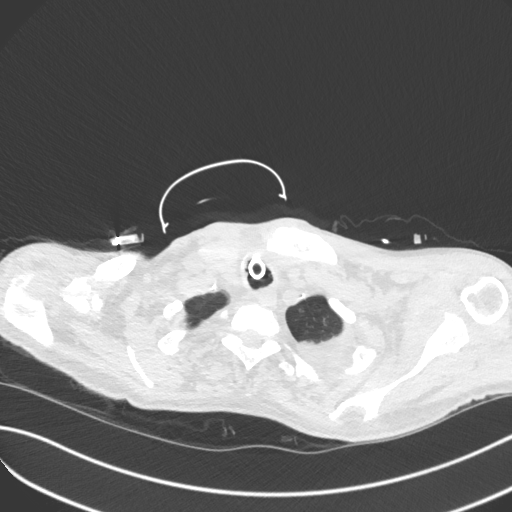
[im 375/399  lung]
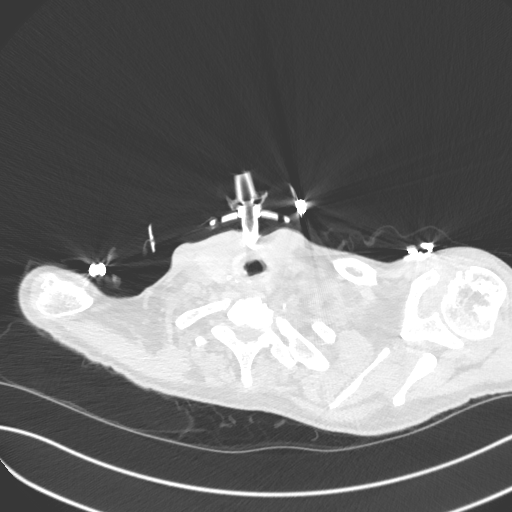

[Series 6: coronal · coronal · 0.62mm/px · 3 of 76 slices shown]
[im 16/76  lung]
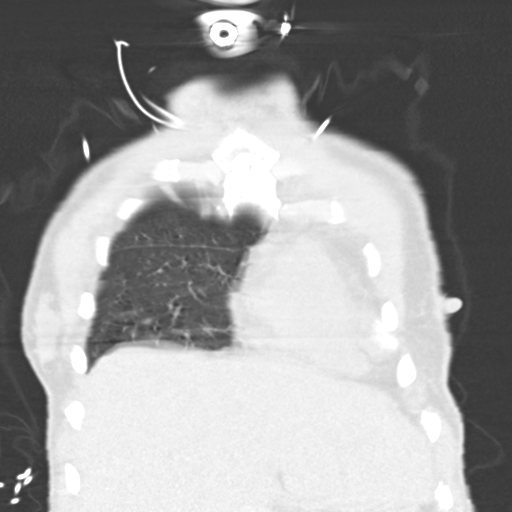
[im 31/76  lung]
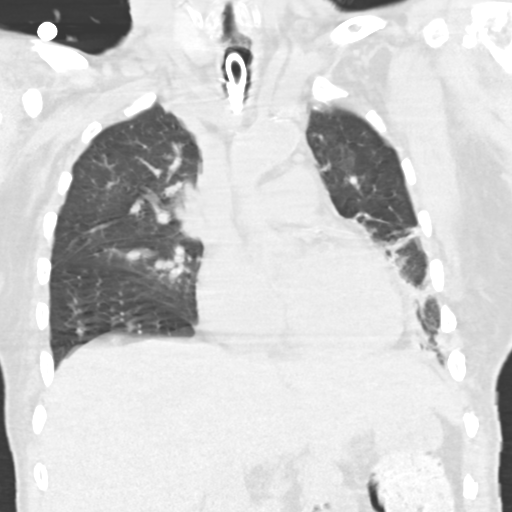
[im 46/76  lung]
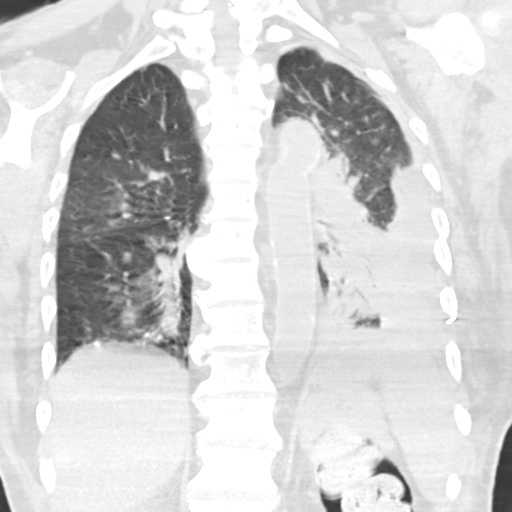

[15 of 36 positions shown; findings below may reference images not displayed]

FINDINGS: Cardiovascular: Calcified coronary artery atherosclerosis. Calcified
aortic atherosclerosis. No pericardial effusion.

Mediastinum/Nodes: Stable subcentimeter mediastinal lymph nodes
since 5053.

Lungs/Pleura: Tracheostomy tube in place.  No adverse features.

On the right side the major airways are patent aside from some
atelectatic changes. No confluent right lung opacity aside from mild
dependent atelectasis. No right pleural effusion.

On the left side the major airways are patent through the segmental
branches. However there is confluent opacity throughout the left
lower lobe with air bronchograms. Superimposed layering and mildly
lobulated left pleural effusion has increased since [REDACTED] and is
moderate. There is superimposed peribronchial nodularity in the
medial aspect of the left upper lobe (series 8, image 29). The
lingula is spared.

Upper Abdomen: Stable noncontrast visible upper abdomen since [REDACTED]
aside from increased retained stool to splenic flexure.

Musculoskeletal: Thoracic spine degeneration. Chronic surgical clips
along the left seventh/ eighth lateral intercostal space, unchanged
since 5053. Chronic left posterolateral rib deformities also are
stable. No acute osseous abnormality identified.
IMPRESSION: 1. Left lower lobe consolidation suspicious for pneumonia.
Superimposed moderate size partially layering partially loculated
left pleural effusion which is increased since [DATE]. Right lung atelectasis.
3. Tracheostomy in place with no adverse features.
4. Aortic Atherosclerosis ([id number]-ND8.8). Calcified coronary artery
atherosclerosis.

## 2018-02-07 IMAGING — DX DG CHEST 1V PORT
1 series · 1 of 1 positions shown · non-contrast
Comparison: Portable exam 4193 hours compared to 12/25/2016

CLINICAL DATA: Pneumonia, history hypertension, aspiration, stage
III chronic kidney disease, stroke

EXAM:
PORTABLE CHEST 1 VIEW

[chest ap]
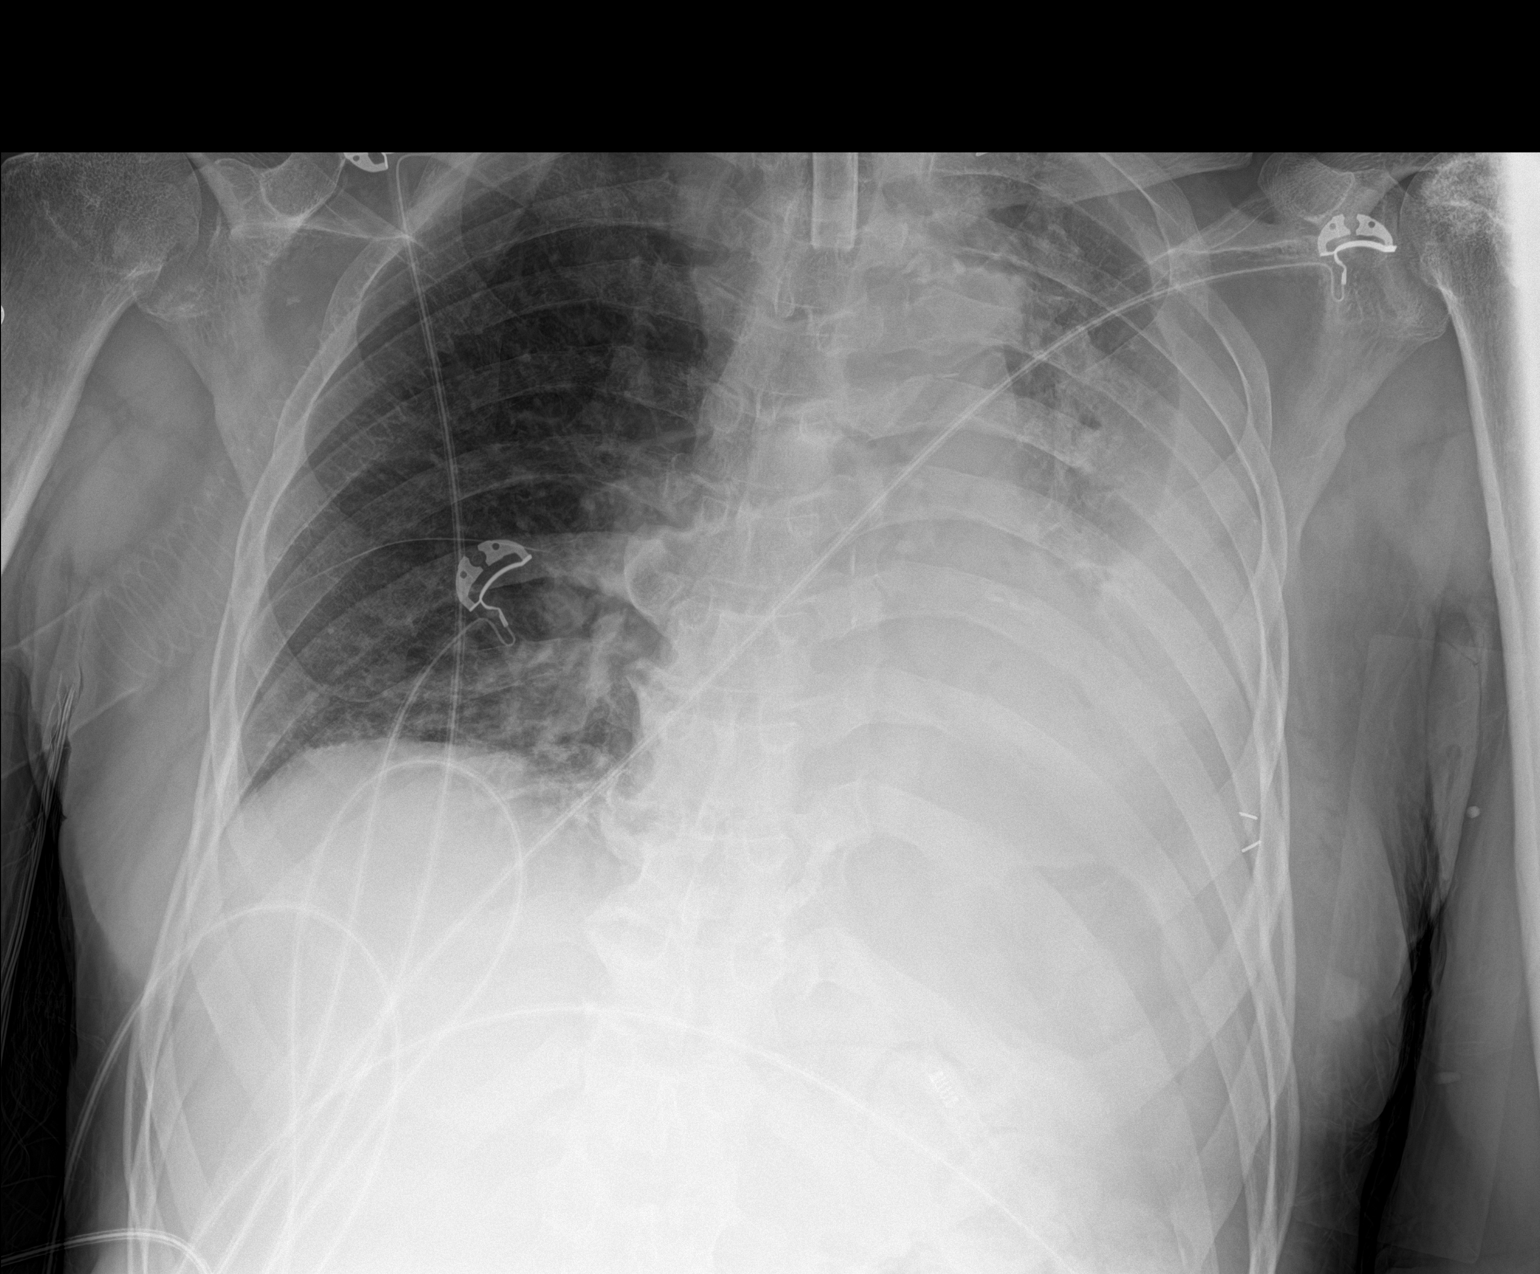

[1 of 1 positions shown; findings below may reference images not displayed]

FINDINGS: Tracheostomy tube stable.

Stable heart size and mediastinal contours.

Moderate to large LEFT pleural effusion significantly increased
since previous ago.

Coexistent atelectasis and consolidation of LEFT lung.

Persistent infiltrate at RIGHT base.

No definite RIGHT pleural effusion or evidence of pneumothorax.

Bones demineralized with prior avascular necrosis of the LEFT
humeral head.
IMPRESSION: Significantly increased LEFT pleural effusion with increased
atelectasis and consolidation of LEFT lung.

Persistent consolidation at RIGHT lung base.

## 2018-02-09 IMAGING — CR DG CHEST 1V PORT
1 series · 1 of 1 positions shown · non-contrast
Comparison: 12/29/2016

CLINICAL DATA: Acute respiratory failure with hypoxia.

EXAM:
PORTABLE CHEST 1 VIEW

[AP]
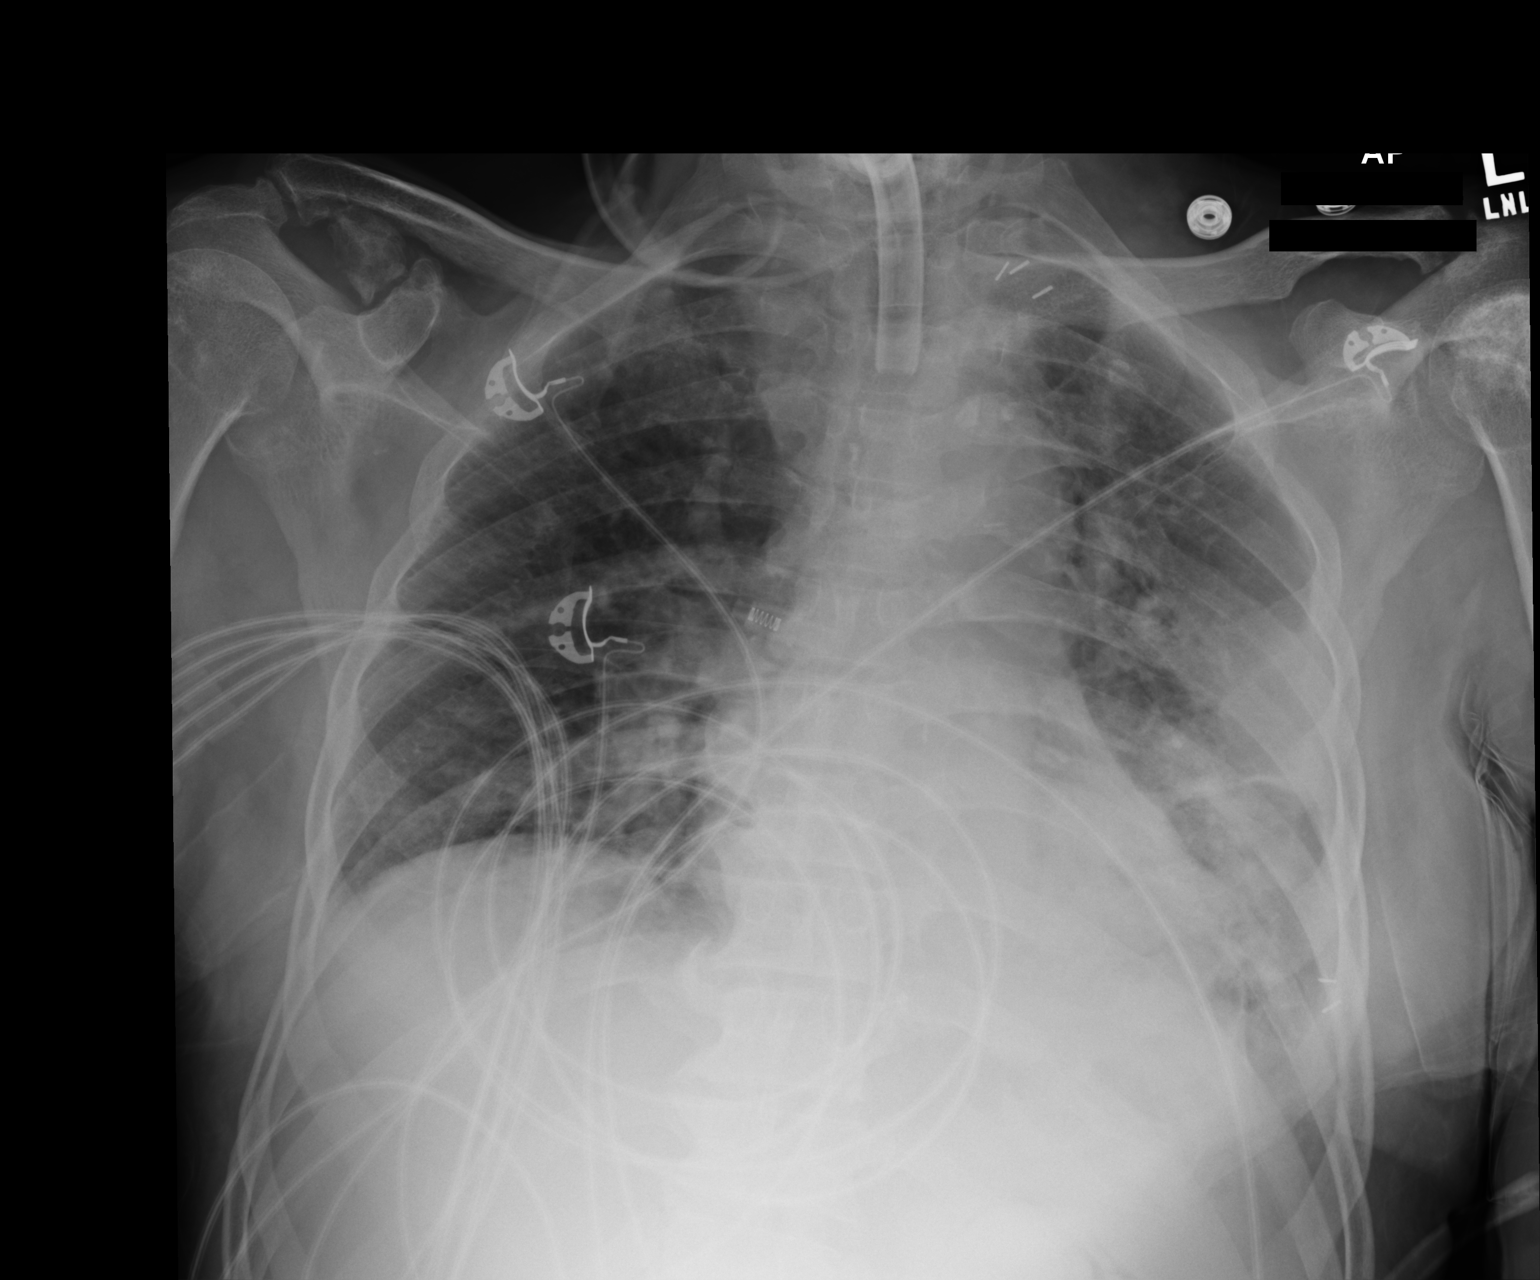

[1 of 1 positions shown; findings below may reference images not displayed]

FINDINGS: Tracheostomy remains in place. Right lung remains clear except for
mild atelectasis in the right lower lung. Marked improvement on the
left with much less pleural fluid and better aeration of the left
lung. Moderate left lower lobe atelectasis/ infiltrate does persist.
IMPRESSION: Improved with less pleural density on the left and better aeration
of the left lung. Persistent lower lobe atelectatic changes left
worse than right.

## 2018-02-11 IMAGING — DX DG CHEST 1V PORT
1 series · 1 of 1 positions shown · non-contrast
Comparison: 12/31/2016

CLINICAL DATA: Tracheostomy.  Respiratory failure

EXAM:
PORTABLE CHEST 1 VIEW

[chest ap]
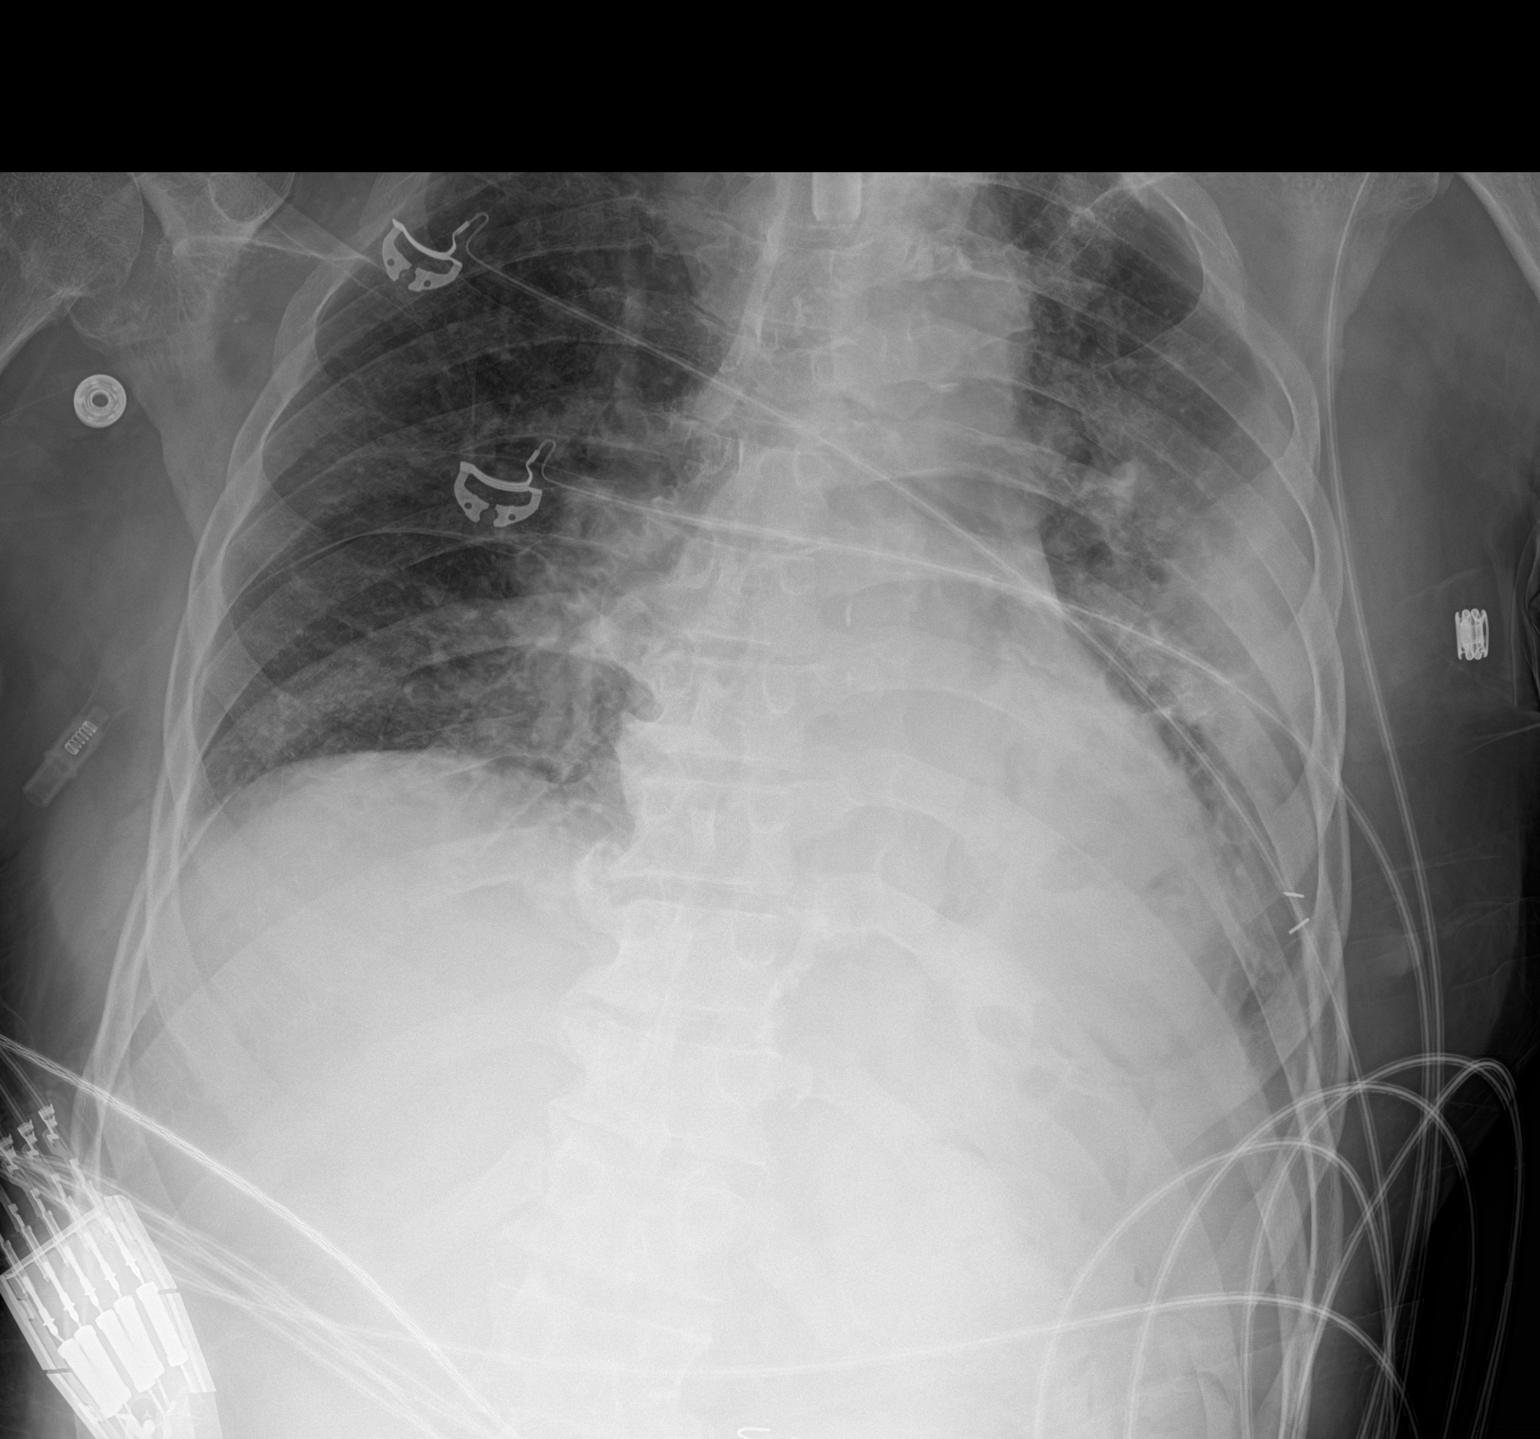

[1 of 1 positions shown; findings below may reference images not displayed]

FINDINGS: Tracheostomy remains in good position and unchanged

Left lower lobe infiltrate unchanged. Possible loculated pleural
effusion also unchanged.

Mild right lower lobe airspace disease unchanged. Right lung
otherwise clear. Negative for edema
IMPRESSION: No interval change. Left lower lobe consolidation and probable
loculated pleural effusion on the left unchanged.

## 2018-02-16 IMAGING — DX DG CHEST 1V PORT
1 series · 1 of 1 positions shown · non-contrast
Comparison: 01/02/2017

CLINICAL DATA: Loculated pleural effusion

EXAM:
PORTABLE CHEST 1 VIEW

[chest ap]
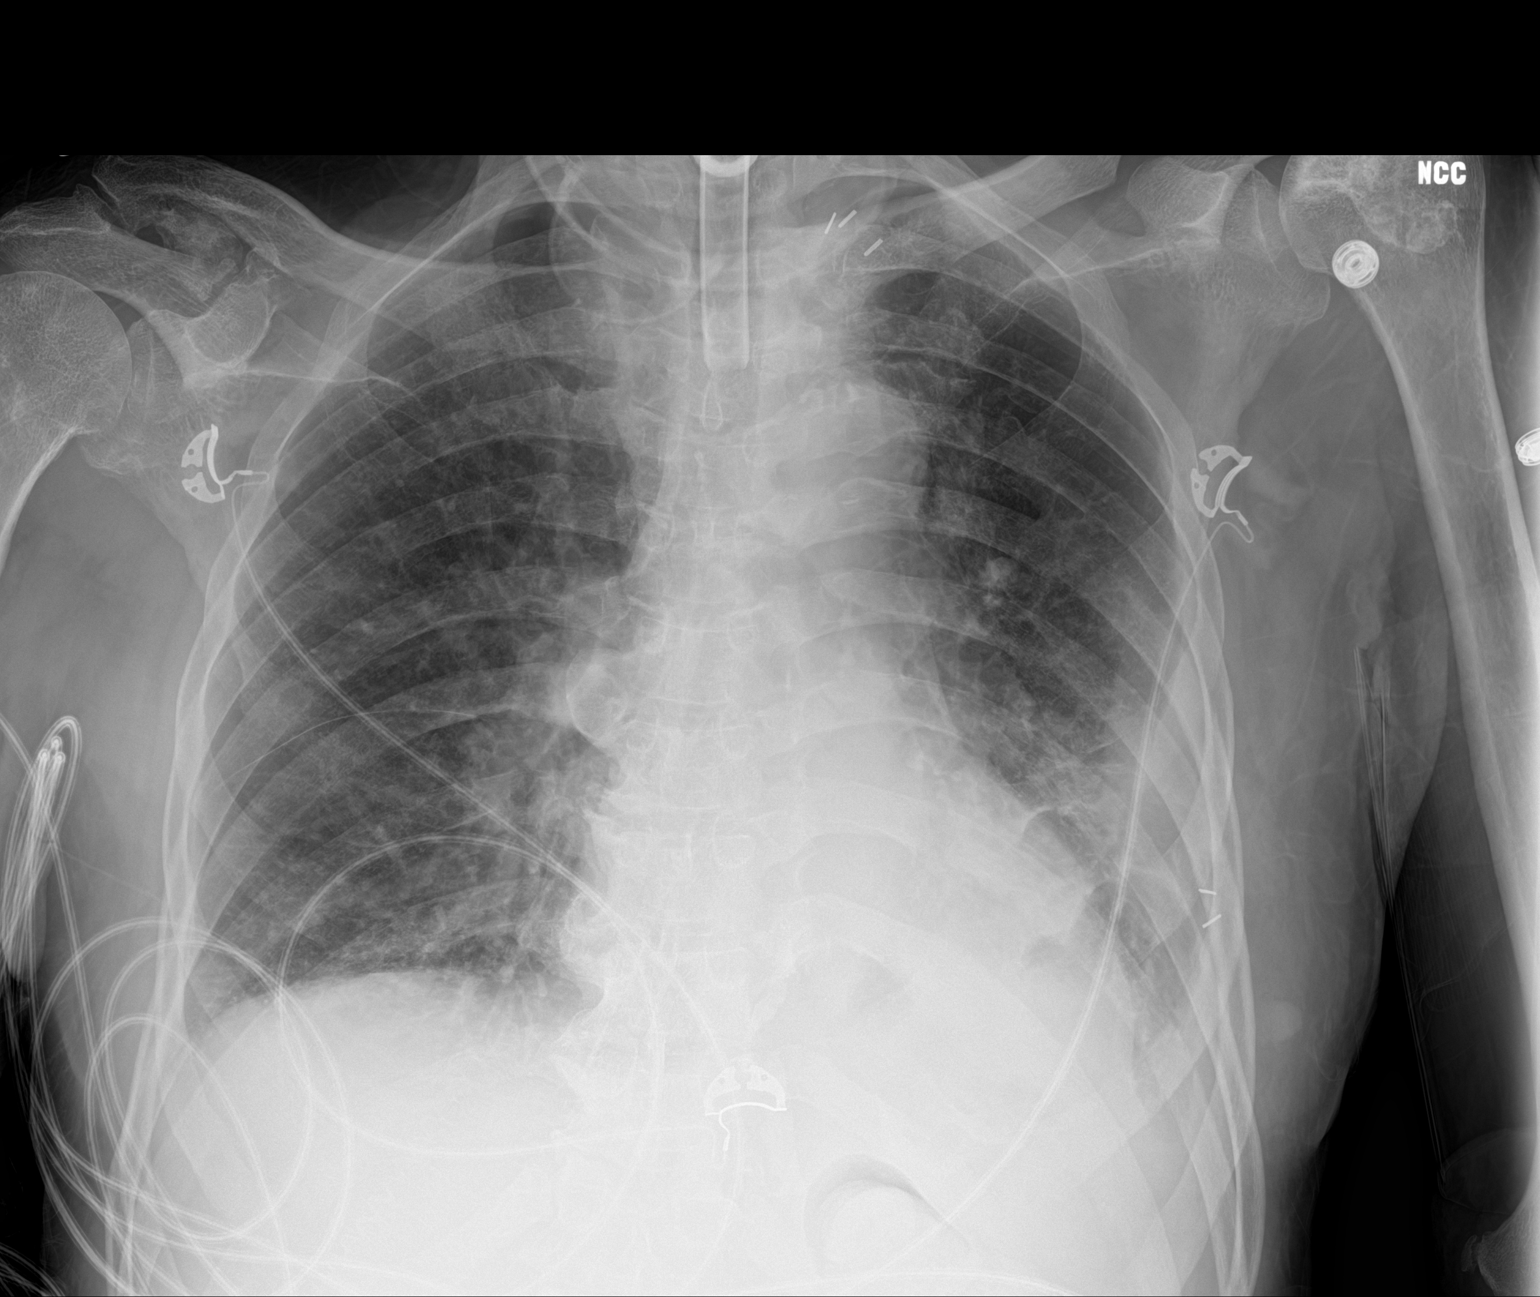

[1 of 1 positions shown; findings below may reference images not displayed]

FINDINGS: Left lower lobe consolidation unchanged. Partially loculated left
pleural effusion unchanged.

Mild right lower lobe airspace disease unchanged. No significant
effusion on the right. Tracheostomy in good position.
IMPRESSION: No interval change. Left lower lobe consolidation and partially
loculated left effusion.

## 2018-03-21 IMAGING — CR DG CHEST 2V
2 series · 2 of 2 positions shown · non-contrast
Comparison: Chest radiograph performed 01/07/2017

CLINICAL DATA: Acute onset of hemoptysis after self-removal of
tracheostomy tube. Initial encounter.

EXAM:
CHEST  2 VIEW

[chest lat]
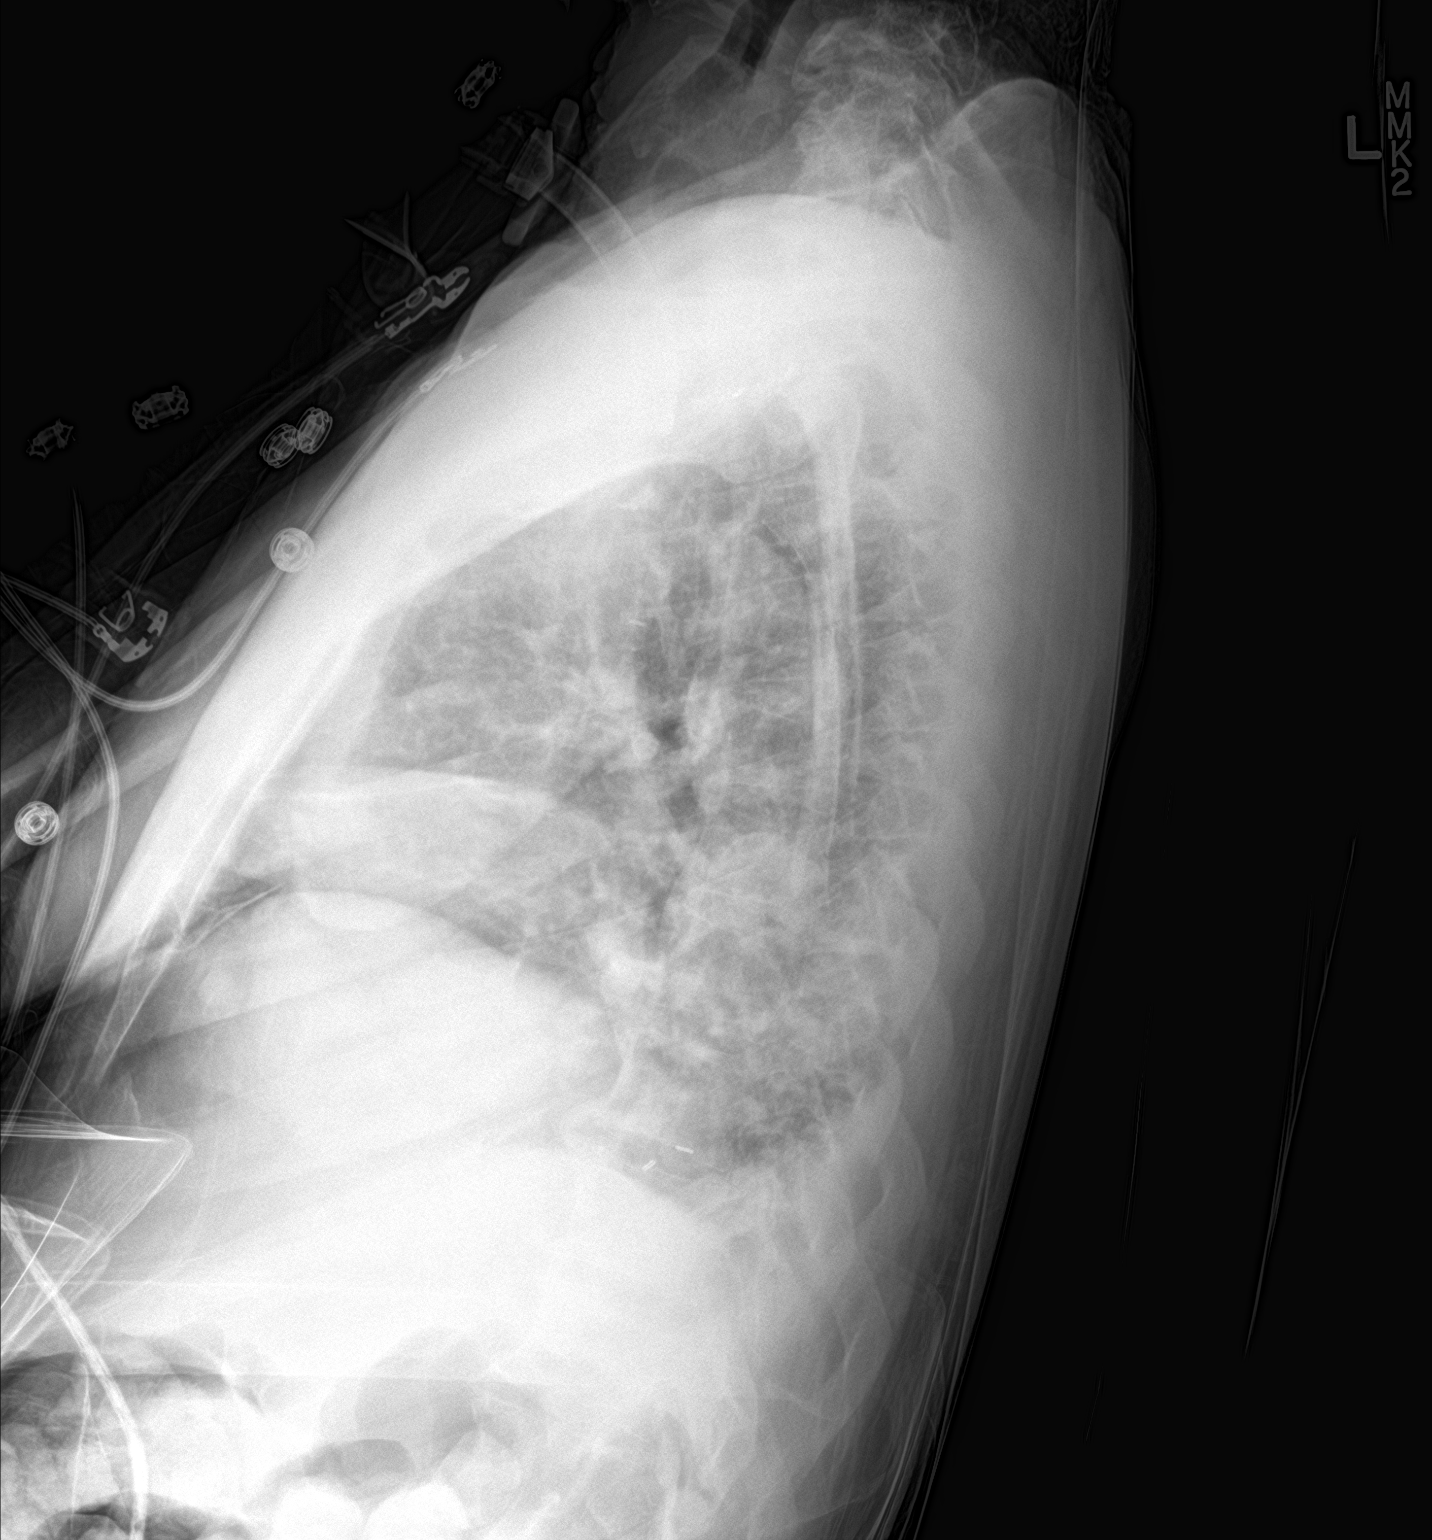

[chest ap]
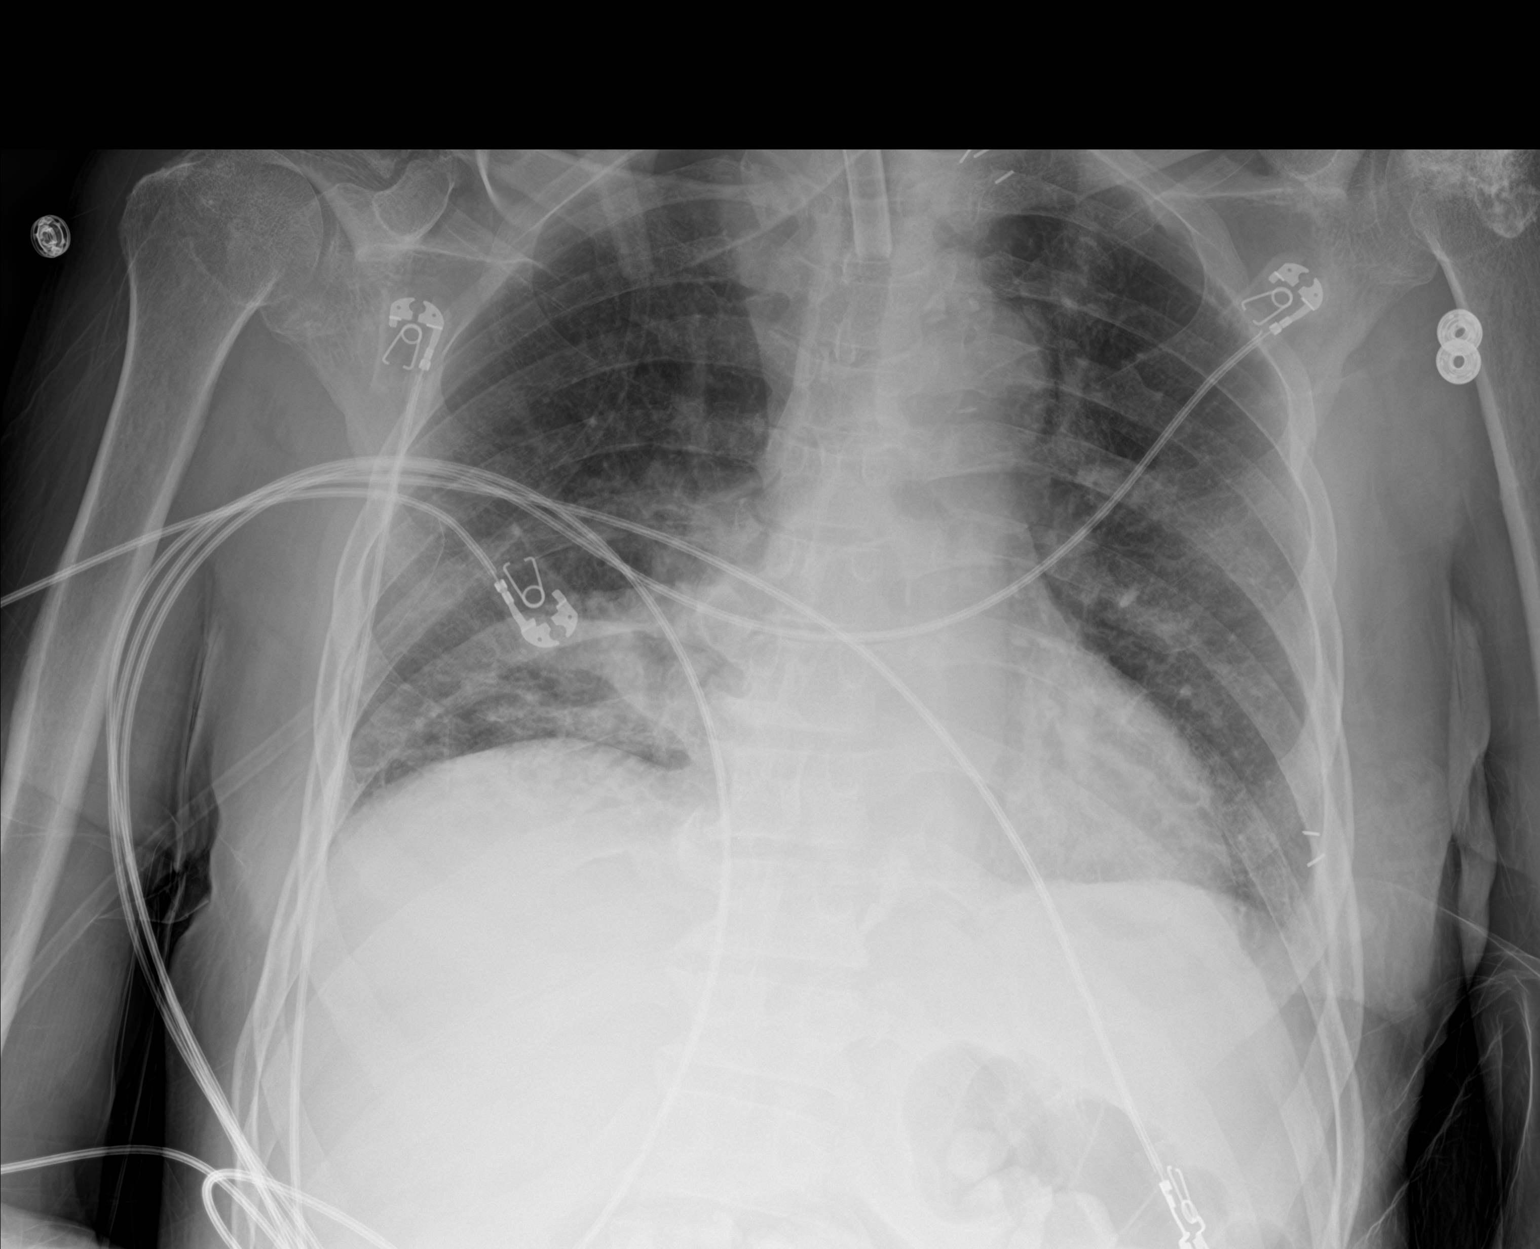

[2 of 2 positions shown; findings below may reference images not displayed]

FINDINGS: The patient's tracheostomy tube is seen ending 5 cm above the
carina.

Right basilar airspace opacity raises concern for pneumonia.
Aspiration cannot be excluded, given clinical presentation. No
pleural effusion or pneumothorax is seen.

The heart is normal in size. No acute osseous abnormalities are
identified. Dense sclerosis is again noted at the left humeral head.
IMPRESSION: 1. Tracheostomy tube seen ending 5 cm above the carina.
2. Right basilar airspace opacity raises concern for pneumonia.
Aspiration cannot be excluded, given clinical presentation.

## 2018-04-05 IMAGING — DX DG CHEST 1V PORT
1 series · 1 of 1 positions shown · non-contrast
Comparison: 02/09/2017 chest radiograph.

CLINICAL DATA: 66 y/o  M; shortness of breath.

EXAM:
PORTABLE CHEST 1 VIEW

[chest ap]
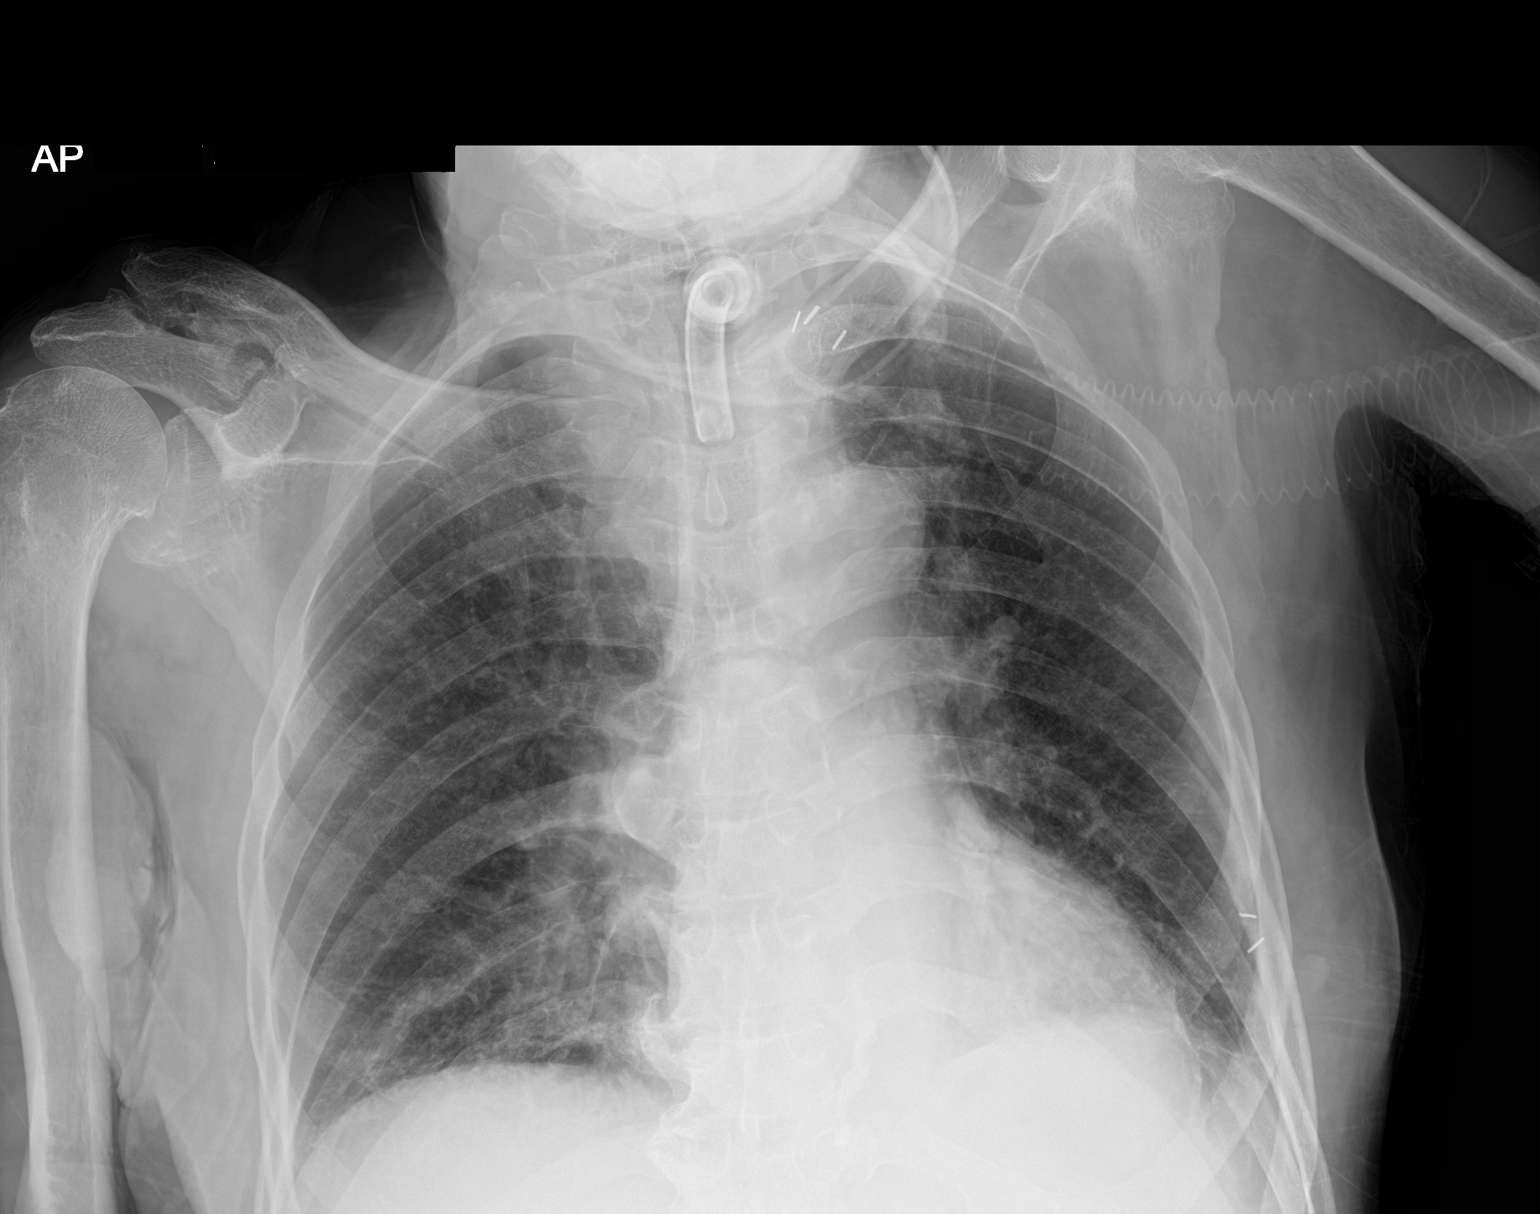

[1 of 1 positions shown; findings below may reference images not displayed]

FINDINGS: Stable cardiac silhouette within normal limits given projection and
technique. Tracheostomy tube in situ. Surgical clips project over
the left medial clavicle. Coarse reticular opacities at left lung
base is stable and probably represent scarring or atelectasis. No
focal consolidation. No pleural effusion or pneumothorax. Multilevel
degenerative changes of the spine.
IMPRESSION: Stable coarse reticular opacities at left lung base probably
representing scarring or atelectasis. No focal consolidation.

By: Blain Jumper M.D.

## 2018-04-09 ENCOUNTER — Other Ambulatory Visit: Payer: Self-pay

## 2018-04-09 ENCOUNTER — Emergency Department (HOSPITAL_BASED_OUTPATIENT_CLINIC_OR_DEPARTMENT_OTHER): Payer: Medicare HMO

## 2018-04-09 ENCOUNTER — Encounter (HOSPITAL_BASED_OUTPATIENT_CLINIC_OR_DEPARTMENT_OTHER): Payer: Self-pay | Admitting: Emergency Medicine

## 2018-04-09 ENCOUNTER — Inpatient Hospital Stay (HOSPITAL_BASED_OUTPATIENT_CLINIC_OR_DEPARTMENT_OTHER)
Admission: EM | Admit: 2018-04-09 | Discharge: 2018-04-16 | DRG: 393 | Disposition: A | Discharge status: Home Health | Payer: Medicare HMO | Attending: Internal Medicine | Admitting: Internal Medicine

## 2018-04-09 DIAGNOSIS — J9621 Acute and chronic respiratory failure with hypoxia: Secondary | ICD-10-CM | POA: Diagnosis present

## 2018-04-09 DIAGNOSIS — N189 Chronic kidney disease, unspecified: Secondary | ICD-10-CM | POA: Diagnosis present

## 2018-04-09 DIAGNOSIS — R112 Nausea with vomiting, unspecified: Secondary | ICD-10-CM | POA: Diagnosis present

## 2018-04-09 DIAGNOSIS — Z79899 Other long term (current) drug therapy: Secondary | ICD-10-CM

## 2018-04-09 DIAGNOSIS — N182 Chronic kidney disease, stage 2 (mild): Secondary | ICD-10-CM | POA: Diagnosis present

## 2018-04-09 DIAGNOSIS — J9601 Acute respiratory failure with hypoxia: Secondary | ICD-10-CM | POA: Diagnosis not present

## 2018-04-09 DIAGNOSIS — Z930 Tracheostomy status: Secondary | ICD-10-CM | POA: Diagnosis not present

## 2018-04-09 DIAGNOSIS — K219 Gastro-esophageal reflux disease without esophagitis: Secondary | ICD-10-CM | POA: Diagnosis present

## 2018-04-09 DIAGNOSIS — Z87891 Personal history of nicotine dependence: Secondary | ICD-10-CM

## 2018-04-09 DIAGNOSIS — M21372 Foot drop, left foot: Secondary | ICD-10-CM | POA: Diagnosis present

## 2018-04-09 DIAGNOSIS — G8929 Other chronic pain: Secondary | ICD-10-CM | POA: Diagnosis present

## 2018-04-09 DIAGNOSIS — E782 Mixed hyperlipidemia: Secondary | ICD-10-CM | POA: Diagnosis present

## 2018-04-09 DIAGNOSIS — N183 Chronic kidney disease, stage 3 (moderate): Secondary | ICD-10-CM | POA: Diagnosis present

## 2018-04-09 DIAGNOSIS — J690 Pneumonitis due to inhalation of food and vomit: Secondary | ICD-10-CM | POA: Diagnosis present

## 2018-04-09 DIAGNOSIS — K4020 Bilateral inguinal hernia, without obstruction or gangrene, not specified as recurrent: Secondary | ICD-10-CM | POA: Diagnosis present

## 2018-04-09 DIAGNOSIS — R0682 Tachypnea, not elsewhere classified: Secondary | ICD-10-CM | POA: Diagnosis present

## 2018-04-09 DIAGNOSIS — Z978 Presence of other specified devices: Secondary | ICD-10-CM

## 2018-04-09 DIAGNOSIS — Z931 Gastrostomy status: Secondary | ICD-10-CM | POA: Diagnosis not present

## 2018-04-09 DIAGNOSIS — Z8673 Personal history of transient ischemic attack (TIA), and cerebral infarction without residual deficits: Secondary | ICD-10-CM

## 2018-04-09 DIAGNOSIS — Z720 Tobacco use: Secondary | ICD-10-CM | POA: Diagnosis present

## 2018-04-09 DIAGNOSIS — E440 Moderate protein-calorie malnutrition: Secondary | ICD-10-CM | POA: Diagnosis present

## 2018-04-09 DIAGNOSIS — Z7982 Long term (current) use of aspirin: Secondary | ICD-10-CM

## 2018-04-09 DIAGNOSIS — Z7902 Long term (current) use of antithrombotics/antiplatelets: Secondary | ICD-10-CM | POA: Diagnosis not present

## 2018-04-09 DIAGNOSIS — K920 Hematemesis: Secondary | ICD-10-CM

## 2018-04-09 DIAGNOSIS — K668 Other specified disorders of peritoneum: Secondary | ICD-10-CM | POA: Diagnosis present

## 2018-04-09 DIAGNOSIS — G8250 Quadriplegia, unspecified: Secondary | ICD-10-CM | POA: Diagnosis present

## 2018-04-09 DIAGNOSIS — I69391 Dysphagia following cerebral infarction: Secondary | ICD-10-CM | POA: Diagnosis not present

## 2018-04-09 DIAGNOSIS — M7989 Other specified soft tissue disorders: Secondary | ICD-10-CM | POA: Diagnosis present

## 2018-04-09 DIAGNOSIS — Z960 Presence of urogenital implants: Secondary | ICD-10-CM | POA: Diagnosis present

## 2018-04-09 DIAGNOSIS — D631 Anemia in chronic kidney disease: Secondary | ICD-10-CM | POA: Diagnosis present

## 2018-04-09 DIAGNOSIS — M25561 Pain in right knee: Secondary | ICD-10-CM | POA: Diagnosis present

## 2018-04-09 DIAGNOSIS — R198 Other specified symptoms and signs involving the digestive system and abdomen: Secondary | ICD-10-CM | POA: Diagnosis not present

## 2018-04-09 DIAGNOSIS — E1122 Type 2 diabetes mellitus with diabetic chronic kidney disease: Secondary | ICD-10-CM | POA: Diagnosis present

## 2018-04-09 DIAGNOSIS — M25562 Pain in left knee: Secondary | ICD-10-CM | POA: Diagnosis present

## 2018-04-09 DIAGNOSIS — R0689 Other abnormalities of breathing: Secondary | ICD-10-CM | POA: Diagnosis not present

## 2018-04-09 DIAGNOSIS — K631 Perforation of intestine (nontraumatic): Principal | ICD-10-CM | POA: Diagnosis present

## 2018-04-09 DIAGNOSIS — Z7189 Other specified counseling: Secondary | ICD-10-CM | POA: Diagnosis not present

## 2018-04-09 DIAGNOSIS — R0600 Dyspnea, unspecified: Secondary | ICD-10-CM

## 2018-04-09 DIAGNOSIS — Z833 Family history of diabetes mellitus: Secondary | ICD-10-CM

## 2018-04-09 DIAGNOSIS — M879 Osteonecrosis, unspecified: Secondary | ICD-10-CM | POA: Diagnosis present

## 2018-04-09 DIAGNOSIS — I10 Essential (primary) hypertension: Secondary | ICD-10-CM | POA: Diagnosis not present

## 2018-04-09 DIAGNOSIS — N179 Acute kidney failure, unspecified: Secondary | ICD-10-CM | POA: Diagnosis present

## 2018-04-09 DIAGNOSIS — Z6823 Body mass index (BMI) 23.0-23.9, adult: Secondary | ICD-10-CM | POA: Diagnosis not present

## 2018-04-09 DIAGNOSIS — K922 Gastrointestinal hemorrhage, unspecified: Secondary | ICD-10-CM | POA: Diagnosis present

## 2018-04-09 DIAGNOSIS — D72829 Elevated white blood cell count, unspecified: Secondary | ICD-10-CM | POA: Diagnosis not present

## 2018-04-09 DIAGNOSIS — Z9889 Other specified postprocedural states: Secondary | ICD-10-CM

## 2018-04-09 DIAGNOSIS — E873 Alkalosis: Secondary | ICD-10-CM | POA: Diagnosis present

## 2018-04-09 DIAGNOSIS — R609 Edema, unspecified: Secondary | ICD-10-CM | POA: Diagnosis not present

## 2018-04-09 DIAGNOSIS — E119 Type 2 diabetes mellitus without complications: Secondary | ICD-10-CM

## 2018-04-09 DIAGNOSIS — Z515 Encounter for palliative care: Secondary | ICD-10-CM | POA: Diagnosis not present

## 2018-04-09 DIAGNOSIS — E870 Hyperosmolality and hypernatremia: Secondary | ICD-10-CM | POA: Diagnosis present

## 2018-04-09 DIAGNOSIS — Z7401 Bed confinement status: Secondary | ICD-10-CM

## 2018-04-09 DIAGNOSIS — M21371 Foot drop, right foot: Secondary | ICD-10-CM | POA: Diagnosis present

## 2018-04-09 DIAGNOSIS — L8990 Pressure ulcer of unspecified site, unspecified stage: Secondary | ICD-10-CM | POA: Diagnosis present

## 2018-04-09 DIAGNOSIS — Z86010 Personal history of colonic polyps: Secondary | ICD-10-CM

## 2018-04-09 DIAGNOSIS — I129 Hypertensive chronic kidney disease with stage 1 through stage 4 chronic kidney disease, or unspecified chronic kidney disease: Secondary | ICD-10-CM | POA: Diagnosis present

## 2018-04-09 HISTORY — DX: Acute pulmonary edema: J81.0

## 2018-04-09 HISTORY — DX: Traumatic pneumothorax, initial encounter: S27.0XXA

## 2018-04-09 HISTORY — DX: Resistance to multiple antibiotics: Z16.24

## 2018-04-09 HISTORY — DX: Pressure ulcer of sacral region, stage 3: L89.153

## 2018-04-09 HISTORY — DX: Other bacterial infections of unspecified site: A49.8

## 2018-04-09 HISTORY — DX: Pneumonia, unspecified organism: J18.9

## 2018-04-09 HISTORY — DX: Urinary tract infection, site not specified: N39.0

## 2018-04-09 HISTORY — DX: Other specified bacterial intestinal infections: A04.8

## 2018-04-09 HISTORY — DX: Disorder of kidney and ureter, unspecified: N28.9

## 2018-04-09 HISTORY — DX: Personal history of colonic polyps: Z86.010

## 2018-04-09 HISTORY — DX: Chronic kidney disease, stage 2 (mild): N18.2

## 2018-04-09 HISTORY — DX: Bacterial infection, unspecified: A49.9

## 2018-04-09 HISTORY — DX: Rhabdomyolysis: M62.82

## 2018-04-09 HISTORY — DX: Gastrostomy malfunction: K94.23

## 2018-04-09 HISTORY — DX: Acute kidney failure, unspecified: N17.9

## 2018-04-09 HISTORY — DX: Cerebral infarction, unspecified: I63.9

## 2018-04-09 HISTORY — DX: Gastrostomy status: Z93.1

## 2018-04-09 HISTORY — DX: Other complications of gastrostomy: K94.29

## 2018-04-09 LAB — CBC WITH DIFFERENTIAL/PLATELET
Abs Immature Granulocytes: 0.05 10*3/uL (ref 0.00–0.07)
Basophils Absolute: 0 10*3/uL (ref 0.0–0.1)
Basophils Relative: 0 %
Eosinophils Absolute: 0.2 10*3/uL (ref 0.0–0.5)
Eosinophils Relative: 2 %
HCT: 58.9 % — ABNORMAL HIGH (ref 39.0–52.0)
Hemoglobin: 17.8 g/dL — ABNORMAL HIGH (ref 13.0–17.0)
Immature Granulocytes: 0 %
Lymphocytes Relative: 8 %
Lymphs Abs: 1.2 10*3/uL (ref 0.7–4.0)
MCH: 29 pg (ref 26.0–34.0)
MCHC: 30.2 g/dL (ref 30.0–36.0)
MCV: 96.1 fL (ref 80.0–100.0)
Monocytes Absolute: 1.4 10*3/uL — ABNORMAL HIGH (ref 0.1–1.0)
Monocytes Relative: 10 %
Neutro Abs: 11.3 10*3/uL — ABNORMAL HIGH (ref 1.7–7.7)
Neutrophils Relative %: 80 %
Platelets: 297 10*3/uL (ref 150–400)
RBC: 6.13 MIL/uL — ABNORMAL HIGH (ref 4.22–5.81)
RDW: 14.6 % (ref 11.5–15.5)
WBC: 14.2 10*3/uL — ABNORMAL HIGH (ref 4.0–10.5)
nRBC: 0 % (ref 0.0–0.2)

## 2018-04-09 LAB — I-STAT ARTERIAL BLOOD GAS, ED
Acid-Base Excess: 5 mmol/L — ABNORMAL HIGH (ref 0.0–2.0)
Acid-Base Excess: 6 mmol/L — ABNORMAL HIGH (ref 0.0–2.0)
Acid-Base Excess: 6 mmol/L — ABNORMAL HIGH (ref 0.0–2.0)
Bicarbonate: 32 mmol/L — ABNORMAL HIGH (ref 20.0–28.0)
Bicarbonate: 32.9 mmol/L — ABNORMAL HIGH (ref 20.0–28.0)
Bicarbonate: 33.3 mmol/L — ABNORMAL HIGH (ref 20.0–28.0)
O2 Saturation: 70 %
O2 Saturation: 74 %
O2 Saturation: 55 %
Patient temperature: 99.5
Patient temperature: 99.5
Patient temperature: 99.5
TCO2: 34 mmol/L — ABNORMAL HIGH (ref 22–32)
TCO2: 34 mmol/L — ABNORMAL HIGH (ref 22–32)
TCO2: 35 mmol/L — ABNORMAL HIGH (ref 22–32)
pCO2 arterial: 54 mmHg — ABNORMAL HIGH (ref 32.0–48.0)
pCO2 arterial: 55.8 mmHg — ABNORMAL HIGH (ref 32.0–48.0)
pCO2 arterial: 56.9 mmHg — ABNORMAL HIGH (ref 32.0–48.0)
pH, Arterial: 7.368 (ref 7.350–7.450)
pH, Arterial: 7.394 (ref 7.350–7.450)
pH, Arterial: 7.378 (ref 7.350–7.450)
pO2, Arterial: 40 mmHg — CL (ref 83.0–108.0)
pO2, Arterial: 42 mmHg — ABNORMAL LOW (ref 83.0–108.0)
pO2, Arterial: 31 mmHg — CL (ref 83.0–108.0)

## 2018-04-09 LAB — COMPREHENSIVE METABOLIC PANEL
ALT: UNDETERMINED U/L (ref 0–44)
ALT: 16 U/L (ref 0–44)
AST: 24 U/L (ref 15–41)
AST: 28 U/L (ref 15–41)
Albumin: UNDETERMINED g/dL (ref 3.5–5.0)
Albumin: 3.2 g/dL — ABNORMAL LOW (ref 3.5–5.0)
Alkaline Phosphatase: 96 U/L (ref 38–126)
Alkaline Phosphatase: 97 U/L (ref 38–126)
Anion gap: 16 — ABNORMAL HIGH (ref 5–15)
Anion gap: 10 (ref 5–15)
BUN: UNDETERMINED mg/dL (ref 8–23)
BUN: 35 mg/dL — ABNORMAL HIGH (ref 8–23)
CO2: 22 mmol/L (ref 22–32)
CO2: 29 mmol/L (ref 22–32)
Calcium: 9.6 mg/dL (ref 8.9–10.3)
Calcium: 9.6 mg/dL (ref 8.9–10.3)
Chloride: 100 mmol/L (ref 98–111)
Chloride: 98 mmol/L (ref 98–111)
Creatinine, Ser: UNDETERMINED mg/dL (ref 0.61–1.24)
Creatinine, Ser: 1.14 mg/dL (ref 0.61–1.24)
GFR calc Af Amer: >60 mL/min (ref >60)
GFR calc non Af Amer: >60 mL/min (ref >60)
Glucose, Bld: 79 mg/dL (ref 70–99)
Glucose, Bld: 97 mg/dL (ref 70–99)
Potassium: 5.2 mmol/L — ABNORMAL HIGH (ref 3.5–5.1)
Potassium: 4.8 mmol/L (ref 3.5–5.1)
Sodium: 138 mmol/L (ref 135–145)
Sodium: 137 mmol/L (ref 135–145)
Total Bilirubin: UNDETERMINED mg/dL (ref 0.3–1.2)
Total Bilirubin: 1.1 mg/dL (ref 0.3–1.2)
Total Protein: UNDETERMINED g/dL (ref 6.5–8.1)
Total Protein: 8.2 g/dL — ABNORMAL HIGH (ref 6.5–8.1)

## 2018-04-09 LAB — LIPASE, BLOOD: Lipase: 23 U/L (ref 11–51)

## 2018-04-09 LAB — MRSA PCR SCREENING: MRSA by PCR: NEGATIVE

## 2018-04-09 LAB — CBG MONITORING, ED: Glucose-Capillary: 113 mg/dL — ABNORMAL HIGH (ref 70–99)

## 2018-04-09 LAB — OCCULT BLOOD X 1 CARD TO LAB, STOOL: Fecal Occult Bld: NEGATIVE

## 2018-04-09 LAB — TROPONIN I: Troponin I: <0.03 ng/mL (ref <0.03)

## 2018-04-09 MED ORDER — CHLORHEXIDINE GLUCONATE 0.12 % MT SOLN
15.0000 mL | Freq: Two times a day (BID) | OROMUCOSAL | Status: DC
  Administered 2018-04-09 – 2018-04-10 (×3): 15 mL via OROMUCOSAL
  Filled 2018-04-09 (×2): qty 15

## 2018-04-09 MED ORDER — ONDANSETRON HCL 4 MG PO TABS: 4.0000 mg | ORAL_TABLET | Freq: Four times a day (QID) | ORAL | Status: DC | PRN

## 2018-04-09 MED ORDER — IOPAMIDOL (ISOVUE-300) INJECTION 61%
100.0000 mL | Freq: Once | INTRAVENOUS | Status: AC | PRN
  Administered 2018-04-09: 100 mL via INTRAVENOUS

## 2018-04-09 MED ORDER — ORAL CARE MOUTH RINSE
15.0000 mL | Freq: Two times a day (BID) | OROMUCOSAL | Status: DC
  Administered 2018-04-10: 15 mL via OROMUCOSAL

## 2018-04-09 MED ORDER — ONDANSETRON HCL 4 MG/2ML IJ SOLN: 4.0000 mg | Freq: Four times a day (QID) | INTRAMUSCULAR | Status: DC | PRN

## 2018-04-09 MED ORDER — SODIUM CHLORIDE 0.9 % IV SOLN
80.0000 mg | Freq: Once | INTRAVENOUS | Status: AC
  Administered 2018-04-09: 80 mg via INTRAVENOUS
  Filled 2018-04-09: qty 80

## 2018-04-09 MED ORDER — IPRATROPIUM-ALBUTEROL 0.5-2.5 (3) MG/3ML IN SOLN
3.0000 mL | RESPIRATORY_TRACT | Status: DC | PRN
  Administered 2018-04-15 – 2018-04-16 (×2): 3 mL via RESPIRATORY_TRACT
  Filled 2018-04-09 (×2): qty 3

## 2018-04-09 MED ORDER — SODIUM CHLORIDE 0.9 % IV BOLUS
1000.0000 mL | Freq: Once | INTRAVENOUS | Status: AC
  Administered 2018-04-09: 1000 mL via INTRAVENOUS

## 2018-04-09 MED ORDER — PIPERACILLIN-TAZOBACTAM 3.375 G IVPB 30 MIN
3.3750 g | Freq: Once | INTRAVENOUS | Status: AC
  Administered 2018-04-09: 3.375 g via INTRAVENOUS
  Filled 2018-04-09 (×2): qty 50

## 2018-04-09 MED ORDER — PIPERACILLIN-TAZOBACTAM 3.375 G IVPB
3.3750 g | Freq: Three times a day (TID) | INTRAVENOUS | Status: DC
  Administered 2018-04-09 – 2018-04-16 (×20): 3.375 g via INTRAVENOUS
  Filled 2018-04-09 (×20): qty 50

## 2018-04-09 MED ORDER — SODIUM CHLORIDE 0.9 % IV SOLN
INTRAVENOUS | Status: DC
  Administered 2018-04-09 (×3): via INTRAVENOUS

## 2018-04-09 MED ORDER — PANTOPRAZOLE SODIUM 40 MG IV SOLR
40.0000 mg | Freq: Two times a day (BID) | INTRAVENOUS | Status: DC
  Administered 2018-04-09 – 2018-04-15 (×12): 40 mg via INTRAVENOUS
  Filled 2018-04-09 (×12): qty 40

## 2018-04-09 MED ORDER — PANTOPRAZOLE SODIUM 40 MG IV SOLR
INTRAVENOUS | Status: AC
  Filled 2018-04-09: qty 80

## 2018-04-09 NOTE — ED Notes (Signed)
Report to [facility] ETA 25 minutes 

## 2018-04-09 NOTE — Progress Notes (Signed)
Called by Dr. Orlie Dakin at [facility] for transfer.  Patient Scott Rivera is a 67 year old male whose bedbound and has a history of a tracheostomy and gastrostomy tube along with chronic indwelling Foley secondary to CVA who presented with vomiting dark emesis.  While in the ED he vomited coffee-ground emesis.  Per EDP abdominal exam was nonlocalizing but he still obtained a CT abdomen pelvis and found that he had a perforated viscus and likely patient also aspirated.  He was started on IV antibiotics with IV Zosyn and general surgery was consulted for further evaluation recommendations.  Dr. Lucia Gaskins wants to wait to talk to the sister about doing the patient to surgery as this he states his last resort.  FOBT was negative however patient is still vomiting some blood.  Patient accepted to a stepdown bed.  General surgery to follow and patient is currently being started on a Protonix drip and given 1 L normal saline bolus.

## 2018-04-09 NOTE — ED Notes (Signed)
Awaiting [facility] for transport

## 2018-04-09 NOTE — ED Notes (Signed)
Right Radial Artery x 1 attempt for ABG.  Results Mixed Venous. MD made aware. Second attempt to obtain ABG at Right Brachial. Third attempt Right Brachial. Once again Mixed Venous. No complications noted. Family at bedside. MD made aware that RT was unable to collect arterial sample. HR 121, SpO2 90-93% on RA.

## 2018-04-09 NOTE — ED Triage Notes (Signed)
Received pt from home via EMS with c/o vomiting sometime throughout the night. Pt had an episode of coffee ground colored emesis in ER room.

## 2018-04-09 NOTE — H&P (Signed)
History and Physical    Scott Rivera:[id number] DOB: [birth year] DOA: 04/09/2018  PCP: Jamesetta Orleans, PA-C   Patient coming from: Home  I have personally briefly reviewed patient's old medical records in [place]  Chief Complaint: Patient was found by sister with black vomit everywhere  HPI: Scott Rivera is a 66 y.o. 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  Patient is nonverbal and does not provide any history.  No further history can be obtained from the family as well.  ED Course: Patient was taken to [place] where he was found to have leukocytosis.  CT of the abdomen showed free intraperitoneal air concerning for perforated viscus along with right lower lobe infiltrate.  Antibiotics were started.  General surgery was consulted who recommended admission to hospitalist service.  Review of Systems: Could not be obtained because patient is nonverbal.  Past Medical History:  Diagnosis Date  . Anemia due to chronic kidney disease   . Aspiration pneumonia ([place])   . Chronic kidney disease, stage 3 (moderate) (H[facility])   . Hypertension   . Renal insufficiency   . Stroke [facility])     Past Surgical History:  Procedure Laterality Date  . IR GASTROSTOMY TUBE MOD SED  10/27/2016  . IR PATIENT EVAL TECH 0-60 MINS  01/05/2017  . IR PATIENT EVAL TECH 0-60 MINS  01/09/2017  . IR PATIENT EVAL TECH 0-60 MINS  03/03/2017  . IR PATIENT EVAL TECH 0-60 MINS  04/03/2017  . IR REPLACE G-TUBE SIMPLE WO FLUORO  11/20/2017  . IR [place] TUBE PERCUT W/FLUORO  07/23/2017   . KNEE SURGERY    . TRACHEOSTOMY TUBE PLACEMENT     Social history -As per chart review  reports that he has quit smoking. He has never used smokeless tobacco. He reports previous alcohol use. He reports previous drug use.  No Known Allergies  Family History  Problem Relation Age of Onset  . Diabetes Mellitus II Brother   . CAD Neg Hx   . Stroke Neg Hx     Prior to Admission medications   Medication Sig Start Date End Date Taking? Authorizing Provider  Ferrous Sulfate (IRON) 325 (65 Fe) MG TABS Take by mouth.   Yes [provider]  vitamin C (ASCORBIC ACID) 500 MG tablet Take 500 mg by mouth daily.   Yes [provider]  acetaminophen (TYLENOL) 500 MG tablet Place 1,000 mg into feeding tube 3 (three) times daily.     [provider]  Amino Acids-Protein Hydrolys (FEEDING SUPPLEMENT, PRO-STAT SUGAR FREE 64,) LIQD Take 30 mLs by mouth at bedtime.    [provider]  amLODipine (NORVASC) 10 MG tablet Place 1 tablet (10 mg total) into feeding tube daily. 11/23/17   Kayleen Memos, DO  aspirin 81 MG chewable tablet Place 1 tablet (81 mg total) into feeding tube daily. 04/05/17   Nita Sells, MD  atropine 1 % ophthalmic solution Place 2 drops under the tongue every 8 (eight) hours as needed (excessive secretion). Patient taking differently: Place 2 drops under the tongue 2 (two) times daily.  07/03/17   Lavina Hamman, MD  bisacodyl (DULCOLAX) 10 MG suppository Place 1 suppository (10 mg total) rectally daily. 12/22/17   Thurnell Lose, MD  cloNIDine (CATAPRES - DOSED IN MG/24 HR) 0.1 mg/24hr patch Place 1 patch (0.1 mg total) onto the skin once a week. Patient taking differently: Place 0.1 mg onto the skin every Tuesday.  04/10/17   Nita Sells, MD  clopidogrel (PLAVIX) 75 MG tablet Place 1 tablet (75 mg total) into feeding tube daily. 01/11/17   Cherene Altes, MD  gabapentin (NEURONTIN) 100 MG capsule 100 mg See admin instructions.  Open 1 capsule (100 mg) and administer contents via feeding tube three times daily    [provider]  ipratropium-albuterol (DUONEB) 0.5-2.5 (3) MG/3ML SOLN Take 3 mLs by nebulization every 4 (four) hours as needed. Patient taking differently: Take 3 mLs by nebulization every 4 (four) hours as needed (shortness of breath/wheezing).  01/10/17   Cherene Altes, MD  metoprolol tartrate (LOPRESSOR) 50 MG tablet Take 1 tablet (50 mg total) by mouth 2 (two) times daily. 12/21/17   Thurnell Lose, MD  montelukast (SINGULAIR) 10 MG tablet Place 10 mg into feeding tube at bedtime.     [provider]  mouth rinse LIQD solution 10 mLs by Mouth Rinse route 4 (four) times daily.    [provider]  Nutritional Supplements (FEEDING SUPPLEMENT, JEVITY 1.2 CAL,) LIQD Place 1,000 mLs into feeding tube continuous. 11/22/17   Kayleen Memos, DO  pantoprazole (PROTONIX) 40 MG tablet Take 1 tablet (40 mg total) by mouth daily. 12/21/17   Thurnell Lose, MD  pravastatin (PRAVACHOL) 20 MG tablet Place 1 tablet (20 mg total) into feeding tube daily at 6 PM. Patient taking differently: Place 20 mg daily into feeding tube.  01/10/17   Cherene Altes, MD  senna (SENOKOT) 8.6 MG TABS tablet Take 1 tablet by mouth daily.    [provider]  thiamine 100 MG tablet Place 1 tablet (100 mg total) into feeding tube daily. 01/11/17   Cherene Altes, MD  Water For Irrigation, Sterile (FREE WATER) SOLN Place 100 mLs into feeding tube daily. Patient taking differently: Place 100 mLs into feeding tube every 3 (three) hours.  07/03/17   Lavina Hamman, MD    Physical Exam: Vitals:   04/09/18 1549 04/09/18 1554 04/09/18 1600 04/09/18 1608  BP: 133/72   115/63  Pulse:  (!) 113  (!) 110  Resp:  (!) 28  20  Temp: 98.5 F (36.9 C)  98.5 F (36.9 C)   TempSrc: Oral  Oral   SpO2:  97%  98%    Constitutional: Looks chronically ill.  No acute distress.  Nonverbal Vitals:   04/09/18 1549  04/09/18 1554 04/09/18 1600 04/09/18 1608  BP: 133/72   115/63  Pulse:  (!) 113  (!) 110  Resp:  (!) 28  20  Temp: 98.5 F (36.9 C)  98.5 F (36.9 C)   TempSrc: Oral  Oral   SpO2:  97%  98%   Eyes: PERRL, lids and conjunctivae normal ENMT: Mucous membranes are dry. Posterior pharynx clear of any exudate or lesions. Neck: Tracheostomy present, no masses, no thyromegaly Respiratory: bilateral decreased breath sounds at bases with scattered crackles.  No wheezing.  Normal respiratory effort. No accessory muscle use.  Cardiovascular: S1 S2 positive, recorded.  No extremity edema. 2+ pedal pulses.  Abdomen: Midline surgical scar.  Mild diffuse tenderness, no masses palpated. No hepatosplenomegaly. Bowel sounds sluggish.  Gastrostomy tube in  place. Musculoskeletal: no clubbing / cyanosis.  Bilateral upper extremities with flexion contractures.  Bilateral lower extremities with muscular atrophy. Genitourinary: Indwelling Foley catheter in place. Neurologic: Alert, awake.  Nonverbal.  Nods his head to some questions.  Quadriplegic. Psychiatric: Could not be assessed because of mental status.  Labs on Admission: I have personally reviewed following labs and imaging studies  CBC: Recent Labs  Lab 04/09/18 0805  WBC 14.2*  NEUTROABS 11.3*  HGB 17.8*  HCT 58.9*  MCV 96.1  PLT 628   Basic Metabolic Panel: Recent Labs  Lab 04/09/18 0849 04/09/18 0905  NA 138 137  K 5.2* 4.8  CL 100 98  CO2 22 29  GLUCOSE 79 97  BUN QUANTITY NOT SUFFICIENT, UNABLE TO PERFORM TEST 35*  CREATININE QUANTITY NOT SUFFICIENT, UNABLE TO PERFORM TEST 1.14  CALCIUM 9.6 9.6   GFR: CrCl cannot be calculated (Unknown ideal weight.). Liver Function Tests: Recent Labs  Lab 04/09/18 0849 04/09/18 0905  AST 24 28  ALT QUANTITY NOT SUFFICIENT, UNABLE TO PERFORM TEST 16  ALKPHOS 96 97  BILITOT QUANTITY NOT SUFFICIENT, UNABLE TO PERFORM TEST 1.1  PROT QUANTITY NOT SUFFICIENT, UNABLE TO PERFORM TEST 8.2*    ALBUMIN QUANTITY NOT SUFFICIENT, UNABLE TO PERFORM TEST 3.2*   Recent Labs  Lab 04/09/18 0849  LIPASE 23   No results for input(s): AMMONIA in the last 168 hours. Coagulation Profile: No results for input(s): INR, PROTIME in the last 168 hours. Cardiac Enzymes: Recent Labs  Lab 04/09/18 0805  TROPONINI <0.03   BNP (last 3 results) No results for input(s): PROBNP in the last 8760 hours. HbA1C: No results for input(s): HGBA1C in the last 72 hours. CBG: Recent Labs  Lab 04/09/18 1319  GLUCAP 113*   Lipid Profile: No results for input(s): CHOL, HDL, LDLCALC, TRIG, CHOLHDL, LDLDIRECT in the last 72 hours. Thyroid Function Tests: No results for input(s): TSH, T4TOTAL, FREET4, T3FREE, THYROIDAB in the last 72 hours. Anemia Panel: No results for input(s): VITAMINB12, FOLATE, FERRITIN, TIBC, IRON, RETICCTPCT in the last 72 hours. Urine analysis:    Component Value Date/Time   COLORURINE YELLOW 12/18/2017 [place] 12/18/2017 1138   LABSPEC 1.025 12/18/2017 1138   PHURINE 7.0 12/18/2017 1138   GLUCOSEU NEGATIVE 12/18/2017 1138   HGBUR SMALL (A) 12/18/2017 1138   BILIRUBINUR NEGATIVE 12/18/2017 1138   KETONESUR NEGATIVE 12/18/2017 1138   PROTEINUR 30 (A) 12/18/2017 1138   NITRITE NEGATIVE 12/18/2017 1138   LEUKOCYTESUR MODERATE (A) 12/18/2017 1138    Radiological Exams on Admission: Dg Chest 2 View  Result Date: 04/09/2018 CLINICAL DATA:  [place].  Vomiting. EXAM: CHEST - 2 VIEW COMPARISON:  Chest radiograph January 01, 2018 and chest CT January 29, 2018 FINDINGS: There is a tracheostomy with tracheostomy catheter tip 5.3 cm above the carina. No pneumothorax. There is scarring in the left mid lung region. There is interstitial thickening in the lungs bilaterally, stable. There is no frank edema or consolidation. Heart size and pulmonary vascularity are normal. No adenopathy. There are surgical clips in the left upper thorax region. There is evidence of old  trauma in the right shoulder region with remodeling. 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 IMPRESSION: Areas of scarring and probable chronic bronchitis. No frank edema or consolidation. Stable cardiac silhouette. Postoperative changes as noted. No pneumothorax. 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 Posttraumatic change with remodeling right shoulder region. Electronically Signed   By: Lowella Grip III M.D.  On: 04/09/2018 10:56   Ct Abdomen Pelvis W Contrast  Result Date: 04/09/2018 CLINICAL DATA:  Nausea, vomited blood during the night, aspirated, history stroke, stage III chronic kidney disease, hypertension EXAM: CT ABDOMEN AND PELVIS WITH CONTRAST TECHNIQUE: Multidetector CT imaging of the abdomen and pelvis was performed using the standard protocol following bolus administration of intravenous contrast. Sagittal and coronal MPR images reconstructed from axial data set. CONTRAST:  167mL ISOVUE-300 IOPAMIDOL (ISOVUE-300) INJECTION 61% IV. No oral contrast. COMPARISON:  12/18/2017 FINDINGS: Lower chest: Minimal atelectasis LEFT base. RIGHT lower lobe infiltrate which could represent in pneumonia or aspiration. Hepatobiliary: Vague 9 mm nonspecific low-attenuation focus anteriorly within liver. No other focal hepatic abnormalities. Gallbladder unremarkable. No biliary dilatation. Pancreas: Normal appearance Spleen: Normal appearance Adrenals/Urinary Tract: Adrenal glands normal appearance. Absent LEFT kidney question congenital absence versus prior nephrectomy. RIGHT kidney and ureter normal appearance. Bladder decompressed by Foley catheter. Stomach/Bowel: Significantly increased stool within rectum with additional increased stool in ascending and transverse colon. Appendix not definitely visualized. Gastrostomy tube in stomach. Mild wall thickening of the distal esophagus question esophagitis versus mass, though distal esophageal injury from vomiting could  cause wall thickening. Stomach and remaining bowel loops unremarkable. Vascular/Lymphatic: Extensive atherosclerotic calcifications aorta and iliac arteries without aneurysm. No adenopathy. Reproductive: No prostatic enlargement. Other: Free air identified in upper abdomen. Unless patient has had recent intra-abdominal surgery or recent placement of gastrostomy tube, findings would be consistent with a perforated viscus. No free fluid. Small BILATERAL inguinal hernias containing fat larger on LEFT. Musculoskeletal: Diffuse osseous demineralization. Avascular necrosis changes of the femoral heads bilaterally. Degenerative disc disease changes lumbar spine. IMPRESSION: Free intraperitoneal air; unless patient has had a recent intra-abdominal procedure or recent placement of the gastrostomy tube, findings are consistent with a perforated viscus. Increased stool in rectum as well as the proximal half of the colon. Wall thickening of distal esophagus which could be related to esophagitis, mass, or, in a patient with a history of vomiting, distal esophageal injury. RIGHT lower lobe infiltrate question pneumonia versus aspiration. Small BILATERAL inguinal hernias containing fat. Critical Value/emergent results were called by telephone at the time of interpretation on 04/09/2018 at 11:11 am to Dr. Orlie Dakin , who verbally acknowledged these results. Electronically Signed   By: Lavonia Dana M.D.   On: 04/09/2018 11:11    Assessment/Plan Active Problems:   Pneumoperitoneum  Probable perforated viscus presenting with coffee-ground emesis and intraperitoneal free air  -General surgery has evaluated the patient.  Spoke with Dr. Lucia Gaskins.  For now the patient will be managed conservatively with IV fluids, IV antibiotics, n.p.o. -Protonix 40 mg IV every 12 hours. -Discussed plan of care with patient's family member including patient's sister along with Dr. Lucia Gaskins.  Patient is a very high risk surgical candidate.   Overall prognosis is guarded to poor.  Recommend palliative care evaluation for goals of care.  Consult palliative care. -Patient remains full code at this time.  Right lower lobe bacterial pneumonia with a concern for aspiration pneumonia -Continue Zosyn.  Follow cultures.  Follow trach cultures.  Strep pneumoniae and Legionella urinary antigen.  Continue oxygen supplementation and wean off as able  Acute hypoxic respiratory failure in a patient with chronic tracheostomy -If respiratory status worsens, will need pulmonary evaluation.  Plan as above  Leukocytosis -Probably secondary to above.  Monitor.  History of stroke with chronic bedridden status along with chronic tracheostomy and gastrostomy with chronic indwelling Foley's catheter -Overall prognosis very poor.  Palliative care evaluation.  Fall precautions    DVT prophylaxis: SCDs.  Avoid Lovenox because of probable perforated viscus and coffee-ground emesis Code Status: Full Family Communication: Discussed with family members including sister at bedside Disposition Plan: Depends on clinical outcome Consults called: General surgery/palliative care Admission status: Inpatient/stepdown  Severity of Illness: The appropriate patient status for this patient is INPATIENT. Inpatient status is judged to be reasonable and necessary in order to provide the required intensity of service to ensure the patient's safety. The patient's presenting symptoms, physical exam findings, and initial radiographic and laboratory data in the context of their chronic comorbidities is felt to place them at high risk for further clinical deterioration. Furthermore, it is not anticipated that the patient will be medically stable for discharge from the hospital within 2 midnights of admission. The following factors support the patient status of inpatient.   " The patient's presenting symptoms include black vomiting. " The worrisome physical exam findings include  hypoxia/tachycardia. " The initial radiographic and laboratory data are worrisome because of leukocytosis/right lower lobe infiltrate/free intraperitoneal air " The chronic co-morbidities include chronic trach/gastrostomy/stroke.   * I certify that at the point of admission it is my clinical judgment that the patient will require inpatient hospital care spanning beyond 2 midnights from the point of admission due to high intensity of service, high risk for further deterioration and high frequency of surveillance required.Aline August MD [facility] Pager [phone]  If 7PM-7AM, please contact night-coverage [email or web address] Password [facility]  04/09/2018, 5:05 PM

## 2018-04-09 NOTE — ED Notes (Signed)
[facility] here to take pt to [facility]

## 2018-04-09 NOTE — ED Provider Notes (Signed)
[facility] EMERGENCY DEPARTMENT Provider Note   CSN: [id number] Arrival date & time: 04/09/18  2297     History   Chief Complaint No chief complaint on file. Chief complaint vomited black material  HPI Scott Rivera is a 67 y.o. male.  HPI Level 5 caveat patient noncommunicative.  History is obtained from EMS and from his sister who accompanies him.  Patient's sister who lives with him reports that he was fine last night at bedtime.  This morning she went into his room to find "black vomit everywhere" no treatment prior to coming here.  No other associated symptoms. Past Medical History:  Diagnosis Date  . Anemia due to chronic kidney disease   . Aspiration pneumonia ([place])   . Chronic kidney disease, stage 3 (moderate) (HCC)   . Hypertension   . Stroke [facility])     Patient Active Problem List   Diagnosis Date Noted  . Acute respiratory failure with hypoxia and hypercarbia (HCC)   . Acute pulmonary edema (HCC)   . 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 Aspiration of gastric contents   . Malnutrition of moderate degree 11/19/2017  . Aspiration pneumonia ([place]) 11/16/2017  . Nausea and vomiting 11/16/2017  . Hyperkalemia 06/26/2017  . Hematuria 06/26/2017  . Bronchitis/??? Pneumonia 03/30/2017  . S/P percutaneous endoscopic gastrostomy (PEG) tube placement ([place]) 03/30/2017  . Chronic anemia 03/30/2017  . Foley catheter in place on admission 03/30/2017  . UTI (urinary tract infection) 03/30/2017  . MDR Acinetobacter baumannii infection   . Gastrostomy tube obstruction ([place])   . PEG tube malfunction ([place])   . Acute respiratory failure with hypoxia ([place]) 03/02/2017  . Loculated pleural effusion   . Acute respiratory distress   . Fever   . Follow up   . HCAP (healthcare-associated pneumonia)   . Stage III pressure ulcer of sacral region ([place]) 12/17/2016  . Hypoxemia   . H/O tracheostomy   . Pneumothorax   . Pneumothorax, closed, traumatic, initial encounter  12/10/2016  . Sepsis ([place]) 12/09/2016  . Hypotension 12/09/2016  . Acute-on-chronic kidney injury ([place]) 12/09/2016  . Elevated troponin 12/09/2016  . Anemia due to chronic kidney disease 12/09/2016  . Compensated metabolic alkalosis 98/92/1194  . Pneumonia   . Goals of care, counseling/discussion   . Palliative care by specialist   . Respiratory failure ([place])   . Aspiration into airway   . Septic shock ([place])   . Dysphagia, post-stroke   . Benign essential HTN   . Tobacco abuse   . Tachypnea   . Hyperglycemia   . Hypernatremia   . Leukocytosis (leucocytosis)   . Acute blood loss anemia   . Chronic kidney disease   . Pressure injury of skin 10/20/2016  . Cerebellar infarct ([place]) 10/19/2016  . ARF (acute renal failure) ([place]) 10/19/2016  . Essential hypertension 10/19/2016  . Rhabdomyolysis 10/19/2016  . History of completed stroke 10/19/2016  . Bilateral chronic knee pain 01/07/2016  . History of adenomatous polyp of colon 07/28/2015  . Hyperlipidemia 07/28/2013  . Helicobacter pylori infection 09/21/2012    Past Surgical History:  Procedure Laterality Date  . IR GASTROSTOMY TUBE MOD SED  10/27/2016  . IR PATIENT EVAL TECH 0-60 MINS  01/05/2017  . IR PATIENT EVAL TECH 0-60 MINS  01/09/2017  . IR PATIENT EVAL TECH 0-60 MINS  03/03/2017  . IR PATIENT EVAL TECH 0-60 MINS  04/03/2017  . IR REPLACE G-TUBE SIMPLE WO FLUORO  11/20/2017  . IR REPLC GASTRO/COLONIC TUBE  PERCUT W/FLUORO  07/23/2017  . KNEE SURGERY    . TRACHEOSTOMY TUBE PLACEMENT          Home Medications    Prior to Admission medications   Medication Sig Start Date End Date Taking? Authorizing Provider  acetaminophen (TYLENOL) 500 MG tablet Place 1,000 mg into feeding tube 3 (three) times daily.     [provider]  Amino Acids-Protein Hydrolys (FEEDING SUPPLEMENT, PRO-STAT SUGAR FREE 64,) LIQD Take 30 mLs by mouth at bedtime.    [provider]  amLODipine (NORVASC) 10 MG tablet Place 1 tablet  (10 mg total) into feeding tube daily. 11/23/17   Kayleen Memos, DO  aspirin 81 MG chewable tablet Place 1 tablet (81 mg total) into feeding tube daily. 04/05/17   Nita Sells, MD  atropine 1 % ophthalmic solution Place 2 drops under the tongue every 8 (eight) hours as needed (excessive secretion). Patient taking differently: Place 2 drops under the tongue 2 (two) times daily.  07/03/17   Lavina Hamman, MD  bisacodyl (DULCOLAX) 10 MG suppository Place 1 suppository (10 mg total) rectally daily. 12/22/17   Thurnell Lose, MD  cloNIDine (CATAPRES - DOSED IN MG/24 HR) 0.1 mg/24hr patch Place 1 patch (0.1 mg total) onto the skin once a week. Patient taking differently: Place 0.1 mg onto the skin every Tuesday.  04/10/17   Nita Sells, MD  clopidogrel (PLAVIX) 75 MG tablet Place 1 tablet (75 mg total) into feeding tube daily. 01/11/17   Cherene Altes, MD  gabapentin (NEURONTIN) 100 MG capsule 100 mg See admin instructions. Open 1 capsule (100 mg) and administer contents via feeding tube three times daily    [provider]  ipratropium-albuterol (DUONEB) 0.5-2.5 (3) MG/3ML SOLN Take 3 mLs by nebulization every 4 (four) hours as needed. Patient taking differently: Take 3 mLs by nebulization every 4 (four) hours as needed (shortness of breath/wheezing).  01/10/17   Cherene Altes, MD  metoprolol tartrate (LOPRESSOR) 50 MG tablet Take 1 tablet (50 mg total) by mouth 2 (two) times daily. 12/21/17   Thurnell Lose, MD  montelukast (SINGULAIR) 10 MG tablet Place 10 mg into feeding tube at bedtime.     [provider]  mouth rinse LIQD solution 10 mLs by Mouth Rinse route 4 (four) times daily.    [provider]  Nutritional Supplements (FEEDING SUPPLEMENT, JEVITY 1.2 CAL,) LIQD Place 1,000 mLs into feeding tube continuous. 11/22/17   Kayleen Memos, DO  pantoprazole (PROTONIX) 40 MG tablet Take 1 tablet (40 mg total) by mouth daily. 12/21/17   Thurnell Lose,  MD  pravastatin (PRAVACHOL) 20 MG tablet Place 1 tablet (20 mg total) into feeding tube daily at 6 PM. Patient taking differently: Place 20 mg daily into feeding tube.  01/10/17   Cherene Altes, MD  ranitidine (ZANTAC) 150 MG/10ML syrup Place 10 mLs (150 mg total) into feeding tube daily. Patient not taking: Reported on 12/18/2017 11/23/17   Kayleen Memos, DO  senna (SENOKOT) 8.6 MG TABS tablet Take 1 tablet by mouth daily.    [provider]  thiamine 100 MG tablet Place 1 tablet (100 mg total) into feeding tube daily. 01/11/17   Cherene Altes, MD  Water For Irrigation, Sterile (FREE WATER) SOLN Place 100 mLs into feeding tube daily. Patient taking differently: Place 100 mLs into feeding tube every 3 (three) hours.  07/03/17   Lavina Hamman, MD    Family History Family  History  Problem Relation Age of Onset  . Diabetes Mellitus II Brother   . CAD Neg Hx   . Stroke Neg Hx     Social History Social History   Tobacco Use  . Smoking status: Former Research scientist (life sciences)  . Smokeless tobacco: Never Used  . Tobacco comment: Smoked heavily until the day of his stroke  Substance Use Topics  . Alcohol use: Not Currently    Comment: Drank heavily prior to stroke per sister  . Drug use: Not Currently     Allergies   Patient has no known allergies.   Review of Systems Review of Systems  Respiratory:       Chronic tracheostomy patient  Gastrointestinal: Positive for vomiting.       Fed by gastrostomy tube  Genitourinary:       Chronic indwelling Foley catheter  Musculoskeletal: Positive for gait problem.       Bedbound     Physical Exam Updated Vital Signs There were no vitals taken for this visit.  Physical Exam Vitals signs and nursing note reviewed.  Constitutional:      Comments: Chronically ill-appearing  HENT:     Head: Normocephalic and atraumatic.     Nose: Nose normal.  Neck:     Musculoskeletal: Neck supple.  Cardiovascular:     Rate and Rhythm: Regular  rhythm.     Comments: Tachycardic Pulmonary:     Comments: Diffuse rhonchi Abdominal:     Comments: Midline surgical scar  Genitourinary:    Penis: Normal.      Comments: Foley catheter in place.  Rectum normal tone brown stool no gross blood Musculoskeletal:     Comments: biLateral upper extremities with flexion contractures.  Bilateral lower extremities with muscular atrophy.  Neurological:     Mental Status: He is alert.     Comments: Quadriplegic.  Nonverbal.  Alert.      ED Treatments / Results  Labs (all labs ordered are listed, but only abnormal results are displayed) Labs Reviewed - No data to display  EKG EKG Interpretation  Date/Time:  Tuesday April 09 2018 08:26:31 EST Ventricular Rate:  128 PR Interval:    QRS Duration: 89 QT Interval:  285 QTC Calculation: 416 R Axis:   106 Text Interpretation:  Sinus tachycardia Multiform ventricular premature complexes LAE, consider biatrial enlargement Consider right ventricular hypertrophy Artifact in lead(s) I II III aVR aVL aVF V1 V2 No significant change since last tracing Confirmed by Orlie Dakin [phone]) on 04/09/2018 8:32:15 AM Chest x-ray viewed by me  Radiology No results found.  Procedures Procedures (including critical care time)  Medications Ordered in ED Medications - No data to display Results for orders placed or performed during the hospital encounter of 04/09/18  CBC with Differential/Platelet  Result Value Ref Range   WBC 14.2 (H) 4.0 - 10.5 K/uL   RBC 6.13 (H) 4.22 - 5.81 MIL/uL   Hemoglobin 17.8 (H) 13.0 - 17.0 g/dL   HCT 58.9 (H) 39.0 - 52.0 %   MCV 96.1 80.0 - 100.0 fL   MCH 29.0 26.0 - 34.0 pg   MCHC 30.2 30.0 - 36.0 g/dL   RDW 14.6 11.5 - 15.5 %   Platelets 297 150 - 400 K/uL   nRBC 0.0 0.0 - 0.2 %   Neutrophils Relative % 80 %   Neutro Abs 11.3 (H) 1.7 - 7.7 K/uL   Lymphocytes Relative 8 %   Lymphs Abs 1.2 0.7 - 4.0 K/uL   Monocytes Relative  10 %   Monocytes Absolute 1.4 (H)  0.1 - 1.0 K/uL   Eosinophils Relative 2 %   Eosinophils Absolute 0.2 0.0 - 0.5 K/uL   Basophils Relative 0 %   Basophils Absolute 0.0 0.0 - 0.1 K/uL   Immature Granulocytes 0 %   Abs Immature Granulocytes 0.05 0.00 - 0.07 K/uL  Troponin I - ONCE - STAT  Result Value Ref Range   Troponin I <0.03 <0.03 ng/mL  Occult blood card to lab, stool Provider will collect  Result Value Ref Range   Fecal Occult Bld NEGATIVE NEGATIVE  Comprehensive metabolic panel  Result Value Ref Range   Sodium 138 135 - 145 mmol/L   Potassium 5.2 (H) 3.5 - 5.1 mmol/L   Chloride 100 98 - 111 mmol/L   CO2 22 22 - 32 mmol/L   Glucose, Bld 79 70 - 99 mg/dL   BUN QUANTITY NOT SUFFICIENT, UNABLE TO PERFORM TEST 8 - 23 mg/dL   Creatinine, Ser QUANTITY NOT SUFFICIENT, UNABLE TO PERFORM TEST 0.61 - 1.24 mg/dL   Calcium 9.6 8.9 - 10.3 mg/dL   Total Protein QUANTITY NOT SUFFICIENT, UNABLE TO PERFORM TEST 6.5 - 8.1 g/dL   Albumin QUANTITY NOT SUFFICIENT, UNABLE TO PERFORM TEST 3.5 - 5.0 g/dL   AST 24 15 - 41 U/L   ALT QUANTITY NOT SUFFICIENT, UNABLE TO PERFORM TEST 0 - 44 U/L   Alkaline Phosphatase 96 38 - 126 U/L   Total Bilirubin QUANTITY NOT SUFFICIENT, UNABLE TO PERFORM TEST 0.3 - 1.2 mg/dL   GFR calc non Af Amer NOT CALCULATED >60 mL/min   GFR calc Af Amer NOT CALCULATED >60 mL/min   Anion gap 16 (H) 5 - 15  Lipase, blood  Result Value Ref Range   Lipase 23 11 - 51 U/L  Comprehensive metabolic panel  Result Value Ref Range   Sodium 137 135 - 145 mmol/L   Potassium 4.8 3.5 - 5.1 mmol/L   Chloride 98 98 - 111 mmol/L   CO2 29 22 - 32 mmol/L   Glucose, Bld 97 70 - 99 mg/dL   BUN 35 (H) 8 - 23 mg/dL   Creatinine, Ser 1.14 0.61 - 1.24 mg/dL   Calcium 9.6 8.9 - 10.3 mg/dL   Total Protein 8.2 (H) 6.5 - 8.1 g/dL   Albumin 3.2 (L) 3.5 - 5.0 g/dL   AST 28 15 - 41 U/L   ALT 16 0 - 44 U/L   Alkaline Phosphatase 97 38 - 126 U/L   Total Bilirubin 1.1 0.3 - 1.2 mg/dL   GFR calc non Af Amer >60 >60 mL/min   GFR calc  Af Amer >60 >60 mL/min   Anion gap 10 5 - 15  I-Stat arterial blood gas, ED  Result Value Ref Range   pH, Arterial 7.394 7.350 - 7.450   pCO2 arterial 54.0 (H) 32.0 - 48.0 mmHg   pO2, Arterial 42.0 (L) 83.0 - 108.0 mmHg   Bicarbonate 32.9 (H) 20.0 - 28.0 mmol/L   TCO2 34 (H) 22 - 32 mmol/L   O2 Saturation 74.0 %   Acid-Base Excess 6.0 (H) 0.0 - 2.0 mmol/L   Patient temperature 99.5 F    Collection site RADIAL, ALLEN'S TEST ACCEPTABLE    Drawn by RT    Sample type ARTERIAL   I-Stat arterial blood gas, ED  Result Value Ref Range   pH, Arterial 7.368 7.350 - 7.450   pCO2 arterial 55.8 (H) 32.0 - 48.0 mmHg   pO2, Arterial  40.0 (LL) 83.0 - 108.0 mmHg   Bicarbonate 32.0 (H) 20.0 - 28.0 mmol/L   TCO2 34 (H) 22 - 32 mmol/L   O2 Saturation 70.0 %   Acid-Base Excess 5.0 (H) 0.0 - 2.0 mmol/L   Patient temperature 99.5 F    Collection site RADIAL, ALLEN'S TEST ACCEPTABLE    Drawn by RT    Sample type ARTERIAL    Comment MD NOTIFIED, SUGGEST RECOLLECT   I-Stat arterial blood gas, ED  Result Value Ref Range   pH, Arterial 7.378 7.350 - 7.450   pCO2 arterial 56.9 (H) 32.0 - 48.0 mmHg   pO2, Arterial 31.0 (LL) 83.0 - 108.0 mmHg   Bicarbonate 33.3 (H) 20.0 - 28.0 mmol/L   TCO2 35 (H) 22 - 32 mmol/L   O2 Saturation 55.0 %   Acid-Base Excess 6.0 (H) 0.0 - 2.0 mmol/L   Patient temperature 99.5 F    Collection site RADIAL, ALLEN'S TEST ACCEPTABLE    Drawn by RT    Sample type ARTERIAL    Comment NOTIFIED PHYSICIAN    Dg Chest 2 View  Result Date: 04/09/2018 CLINICAL DATA:  Rhonchi.  Vomiting. EXAM: CHEST - 2 VIEW COMPARISON:  Chest radiograph January 01, 2018 and chest CT January 29, 2018 FINDINGS: There is a tracheostomy with tracheostomy catheter tip 5.3 cm above the carina. No pneumothorax. There is scarring in the left mid lung region. There is interstitial thickening in the lungs bilaterally, stable. There is no frank edema or consolidation. Heart size and pulmonary vascularity are  normal. No adenopathy. There are surgical clips in the left upper thorax region. There is evidence of old trauma in the right shoulder region with remodeling. 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 IMPRESSION: Areas of scarring and probable chronic bronchitis. No frank edema or consolidation. Stable cardiac silhouette. Postoperative changes as noted. No pneumothorax. 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 Posttraumatic change with remodeling right shoulder region. Electronically Signed   By: Lowella Grip III M.D.   On: 04/09/2018 10:56   Ct Abdomen Pelvis W Contrast  Result Date: 04/09/2018 CLINICAL DATA:  Nausea, vomited blood during the night, aspirated, history stroke, stage III chronic kidney disease, hypertension EXAM: CT ABDOMEN AND PELVIS WITH CONTRAST TECHNIQUE: Multidetector CT imaging of the abdomen and pelvis was performed using the standard protocol following bolus administration of intravenous contrast. Sagittal and coronal MPR images reconstructed from axial data set. CONTRAST:  128mL ISOVUE-300 IOPAMIDOL (ISOVUE-300) INJECTION 61% IV. No oral contrast. COMPARISON:  12/18/2017 FINDINGS: Lower chest: Minimal atelectasis LEFT base. RIGHT lower lobe infiltrate which could represent in pneumonia or aspiration. Hepatobiliary: Vague 9 mm nonspecific low-attenuation focus anteriorly within liver. No other focal hepatic abnormalities. Gallbladder unremarkable. No biliary dilatation. Pancreas: Normal appearance Spleen: Normal appearance Adrenals/Urinary Tract: Adrenal glands normal appearance. Absent LEFT kidney question congenital absence versus prior nephrectomy. RIGHT kidney and ureter normal appearance. Bladder decompressed by Foley catheter. Stomach/Bowel: Significantly increased stool within rectum with additional increased stool in ascending and transverse colon. Appendix not definitely visualized. Gastrostomy tube in stomach. Mild wall thickening of the  distal esophagus question esophagitis versus mass, though distal esophageal injury from vomiting could cause wall thickening. Stomach and remaining bowel loops unremarkable. Vascular/Lymphatic: Extensive atherosclerotic calcifications aorta and iliac arteries without aneurysm. No adenopathy. Reproductive: No prostatic enlargement. Other: Free air identified in upper abdomen. Unless patient has had recent intra-abdominal surgery or recent placement of gastrostomy tube, findings would be consistent with a perforated viscus.  No free fluid. Small BILATERAL inguinal hernias containing fat larger on LEFT. Musculoskeletal: Diffuse osseous demineralization. Avascular necrosis changes of the femoral heads bilaterally. Degenerative disc disease changes lumbar spine. IMPRESSION: Free intraperitoneal air; unless patient has had a recent intra-abdominal procedure or recent placement of the gastrostomy tube, findings are consistent with a perforated viscus. Increased stool in rectum as well as the proximal half of the colon. Wall thickening of distal esophagus which could be related to esophagitis, mass, or, in a patient with a history of vomiting, distal esophageal injury. RIGHT lower lobe infiltrate question pneumonia versus aspiration. Small BILATERAL inguinal hernias containing fat. Critical Value/emergent results were called by telephone at the time of interpretation on 04/09/2018 at 11:11 am to Dr. Orlie Dakin , who verbally acknowledged these results. Electronically Signed   By: Lavonia Dana M.D.   On: 04/09/2018 11:11    Initial Impression / Assessment and Plan / ED Course  I have reviewed the triage vital signs and the nursing notes.  Pertinent labs & imaging results that were available during my care of the patient were reviewed by me and considered in my medical decision making (see chart for details).     Nursing unable to obtain adequate blood for sampling.  I performed phlebotomy from right femoral vein  using 21-gauge needle and syringe after area prepped with alcohol.  No hematoma present after blood draw  Dr. Lucia Gaskins from surgical service consulted.  He will see patient in consultation.  Requesting hospitalist service to admit patient.  Consulted hospitalist service who accepts patient in transfer to [facility]. Patient vomited large amount of coffee-ground emesis a few minutes after arrival here. At 12:20 PM he remains tachycardic.  He is mildly agitated.  IV normal saline bolus ordered as well as intravenous Zosyn and intravenous Protonix.  I am concerned the patient may have aspirated.  Pulse oximetry on room air is 92 to 93%, normal.  Will be admitted to stepdown unit at [facility] hospital Final Clinical Impressions(s) / ED Diagnoses  Diagnosis #1 hematemesis #2 pneumoperitoneum #3 aspiration pneumonia Final diagnoses:  None   CRITICAL CARE Performed by: Orlie Dakin Total critical care time: 45 minutes Critical care time was exclusive of separately billable procedures and treating other patients. Critical care was necessary to treat or prevent imminent or life-threatening deterioration. Critical care was time spent personally by me on the following activities: development of treatment plan with patient and/or surrogate as well as nursing, discussions with consultants, evaluation of patient's response to treatment, examination of patient, obtaining history from patient or surrogate, ordering and performing treatments and interventions, ordering and review of laboratory studies, ordering and review of radiographic studies, pulse oximetry and re-evaluation of patient's condition. ED Discharge Orders    None       Orlie Dakin, MD 04/09/18 1230

## 2018-04-09 NOTE — Progress Notes (Signed)
Pharmacy Antibiotic Note  Scott Rivera is a 67 y.o. male admitted on 04/09/2018 with aspiration PNA.  Pharmacy has been consulted for Zosyn dosing.  Plan: Zosyn 3.375gm IV q8h (4hr extended infusions) No dose adjustments needed, Pharmacy will sign off   Temp (24hrs), Avg:98.8 F (37.1 C), Min:98.5 F (36.9 C), Max:99.5 F (37.5 C)  Recent Labs  Lab 04/09/18 0805 04/09/18 0849 04/09/18 0905  WBC 14.2*  --   --   CREATININE  --  QUANTITY NOT SUFFICIENT, UNABLE TO PERFORM TEST 1.14    CrCl cannot be calculated (Unknown ideal weight.).    No Known Allergies  Antimicrobials this admission: 12/24 Zosyn >>  Dose adjustments this admission: None  Microbiology results: 12/24 BCx: sent  Thank you for allowing pharmacy to be a part of this patient's care.  Peggyann Juba, PharmD, BCPS Pager: [phone] 04/09/2018 5:12 PM
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
## 2018-04-10 ENCOUNTER — Inpatient Hospital Stay (HOSPITAL_COMMUNITY): Payer: Medicare HMO

## 2018-04-10 ENCOUNTER — Encounter (HOSPITAL_COMMUNITY): Payer: Self-pay | Admitting: Surgery

## 2018-04-10 DIAGNOSIS — Z7189 Other specified counseling: Secondary | ICD-10-CM

## 2018-04-10 DIAGNOSIS — Z515 Encounter for palliative care: Secondary | ICD-10-CM

## 2018-04-10 DIAGNOSIS — Z931 Gastrostomy status: Secondary | ICD-10-CM

## 2018-04-10 LAB — COMPREHENSIVE METABOLIC PANEL
ALT: 14 U/L (ref 0–44)
AST: 18 U/L (ref 15–41)
Albumin: 3.1 g/dL — ABNORMAL LOW (ref 3.5–5.0)
Alkaline Phosphatase: 79 U/L (ref 38–126)
Anion gap: 11 (ref 5–15)
BUN: 25 mg/dL — ABNORMAL HIGH (ref 8–23)
CO2: 27 mmol/L (ref 22–32)
Calcium: 9.5 mg/dL (ref 8.9–10.3)
Chloride: 107 mmol/L (ref 98–111)
Creatinine, Ser: 1.13 mg/dL (ref 0.61–1.24)
GFR calc Af Amer: >60 mL/min (ref >60)
GFR calc non Af Amer: >60 mL/min (ref >60)
Glucose, Bld: 78 mg/dL (ref 70–99)
Potassium: 4.2 mmol/L (ref 3.5–5.1)
Sodium: 145 mmol/L (ref 135–145)
Total Bilirubin: 1.2 mg/dL (ref 0.3–1.2)
Total Protein: 8 g/dL (ref 6.5–8.1)

## 2018-04-10 LAB — CBC
HCT: 51.8 % (ref 39.0–52.0)
Hemoglobin: 15.4 g/dL (ref 13.0–17.0)
MCH: 29.7 pg (ref 26.0–34.0)
MCHC: 29.7 g/dL — ABNORMAL LOW (ref 30.0–36.0)
MCV: 99.8 fL (ref 80.0–100.0)
Platelets: 280 10*3/uL (ref 150–400)
RBC: 5.19 MIL/uL (ref 4.22–5.81)
RDW: 14.6 % (ref 11.5–15.5)
WBC: 15.6 10*3/uL — ABNORMAL HIGH (ref 4.0–10.5)
nRBC: 0 % (ref 0.0–0.2)

## 2018-04-10 LAB — STREP PNEUMONIAE URINARY ANTIGEN: Strep Pneumo Urinary Antigen: NEGATIVE

## 2018-04-10 LAB — MAGNESIUM: Magnesium: 2.1 mg/dL (ref 1.7–2.4)

## 2018-04-10 NOTE — Progress Notes (Signed)
[facility] Office:  [id number] General Surgery Progress Note   LOS: 1 day  POD -     Chief Complaint: pneumoperitoneum  Assessment and Plan: 1.  Pneumoperitoneum             On Zosyn - 12/24 >>>  AM labs pending.  Clinically looks good.  No obvious abdominal pain.  He refused blood draws earlier today.  2.  Possible aspiration - right lower lobe infiltrate on CT scan 3.  Tracheostomy - chronic 4.  Gastrostomy tube 5.  Chronic indwelling foley 6.  CVA - originally in July 2018             He was hospitalized in [facility]             Multiple prior infarcts seen on CT of head 08/17/2017  7.  Small bilateral inguinal hernias             Seen on CT scan - sub clinical 8.  Large amount of stool in rectum             History of constipation 9.  Absent left kidney 10.  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  On Plavix - which is on hold   Principal Problem:   Perforated viscus Active Problems:   History of stroke   Leukocytosis   Aspiration pneumonia of right lower lobe ([place])   Tracheostomy dependent ([place])   Gastrostomy tube dependent (HCC)   Subjective:  Alert.  Moves arms and shakes head.  In no discomfort.  Objective:   Vitals:   04/10/18 0600 04/10/18 0700  BP: 122/63 128/61  Pulse: (!) 102 (!) 103  Resp: 19 16  Temp:    SpO2: 100% 100%     Intake/Output from previous day:  12/24 0701 - 12/25 0700 In: 1985.2 [I.V.:792.2; IV Piggyback:1193] Out: 1600 [Urine:1600]  Intake/Output this shift:  No intake/output data recorded.   Physical Exam:   General: Thin older AA M who is alert and oriented.    HEENT: Normal. Pupils equal. .   Lungs: Bilateral rhonchi   Abdomen: Gastrostomy tube in LUQ.  No localized pain or discomfort.     Lab Results:    Recent Labs    04/09/18 0805  WBC 14.2*  HGB 17.8*  HCT 58.9*  PLT 297    BMET   Recent Labs    04/09/18 0849 04/09/18 0905  NA 138 137  K 5.2* 4.8  CL 100 98  CO2 22 29   GLUCOSE 79 97  BUN QUANTITY NOT SUFFICIENT, UNABLE TO PERFORM TEST 35*  CREATININE QUANTITY NOT SUFFICIENT, UNABLE TO PERFORM TEST 1.14  CALCIUM 9.6 9.6    PT/INR  No results for input(s): LABPROT, INR in the last 72 hours.  ABG   Recent Labs    04/09/18 0819 04/09/18 0847  PHART 7.368 7.378  HCO3 32.0* 33.3*     Studies/Results:  Dg Chest 2 View  Result Date: 04/09/2018 CLINICAL DATA:  Rhonchi.  Vomiting. EXAM: CHEST - 2 VIEW COMPARISON:  Chest radiograph January 01, 2018 and chest CT January 29, 2018 FINDINGS: There is a tracheostomy with tracheostomy catheter tip 5.3 cm above the carina. No pneumothorax. There is scarring in the left mid lung region. There is interstitial thickening in the lungs bilaterally, stable. There is no frank edema or consolidation. Heart size and pulmonary vascularity are normal. No adenopathy. There are surgical clips in the left upper thorax region. There is evidence  of old trauma in the right shoulder region with remodeling. 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 IMPRESSION: Areas of scarring and probable chronic bronchitis. No frank edema or consolidation. Stable cardiac silhouette. Postoperative changes as noted. No pneumothorax. 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 Posttraumatic change with remodeling right shoulder region. Electronically Signed   By: Lowella Grip III M.D.   On: 04/09/2018 10:56   Ct Abdomen Pelvis W Contrast  Result Date: 04/09/2018 CLINICAL DATA:  Nausea, vomited blood during the night, aspirated, history stroke, stage III chronic kidney disease, hypertension EXAM: CT ABDOMEN AND PELVIS WITH CONTRAST TECHNIQUE: Multidetector CT imaging of the abdomen and pelvis was performed using the standard protocol following bolus administration of intravenous contrast. Sagittal and coronal MPR images reconstructed from axial data set. CONTRAST:  152mL ISOVUE-300 IOPAMIDOL (ISOVUE-300) INJECTION 61%  IV. No oral contrast. COMPARISON:  12/18/2017 FINDINGS: Lower chest: Minimal atelectasis LEFT base. RIGHT lower lobe infiltrate which could represent in pneumonia or aspiration. Hepatobiliary: Vague 9 mm nonspecific low-attenuation focus anteriorly within liver. No other focal hepatic abnormalities. Gallbladder unremarkable. No biliary dilatation. Pancreas: Normal appearance Spleen: Normal appearance Adrenals/Urinary Tract: Adrenal glands normal appearance. Absent LEFT kidney question congenital absence versus prior nephrectomy. RIGHT kidney and ureter normal appearance. Bladder decompressed by Foley catheter. Stomach/Bowel: Significantly increased stool within rectum with additional increased stool in ascending and transverse colon. Appendix not definitely visualized. Gastrostomy tube in stomach. Mild wall thickening of the distal esophagus question esophagitis versus mass, though distal esophageal injury from vomiting could cause wall thickening. Stomach and remaining bowel loops unremarkable. Vascular/Lymphatic: Extensive atherosclerotic calcifications aorta and iliac arteries without aneurysm. No adenopathy. Reproductive: No prostatic enlargement. Other: Free air identified in upper abdomen. Unless patient has had recent intra-abdominal surgery or recent placement of gastrostomy tube, findings would be consistent with a perforated viscus. No free fluid. Small BILATERAL inguinal hernias containing fat larger on LEFT. Musculoskeletal: Diffuse osseous demineralization. Avascular necrosis changes of the femoral heads bilaterally. Degenerative disc disease changes lumbar spine. IMPRESSION: Free intraperitoneal air; unless patient has had a recent intra-abdominal procedure or recent placement of the gastrostomy tube, findings are consistent with a perforated viscus. Increased stool in rectum as well as the proximal half of the colon. Wall thickening of distal esophagus which could be related to esophagitis, mass, or,  in a patient with a history of vomiting, distal esophageal injury. RIGHT lower lobe infiltrate question pneumonia versus aspiration. Small BILATERAL inguinal hernias containing fat. Critical Value/emergent results were called by telephone at the time of interpretation on 04/09/2018 at 11:11 am to Dr. Orlie Dakin , who verbally acknowledged these results. Electronically Signed   By: Lavonia Dana M.D.   On: 04/09/2018 11:11   Dg Chest Port 1 View  Result Date: 04/10/2018 CLINICAL DATA:  Dyspnea. EXAM: PORTABLE CHEST 1 VIEW COMPARISON:  04/09/2018 FINDINGS: 0500 hours. Tracheostomy tube again noted. Left lung remains clear. Interval progression of airspace disease at the right base. Interstitial markings are diffusely coarsened with chronic features. The cardiopericardial silhouette is within normal limits for size. Bones are diffusely demineralized. Telemetry leads overlie the chest. IMPRESSION: Interval progression of airspace disease at the right base. Given the interval increase in asymmetric elevation right hemidiaphragm a component of the opacity at the right base may be atelectatic. Electronically Signed   By: Misty Stanley M.D.   On: 04/10/2018 07:45     Anti-infectives:   Anti-infectives (From admission, onward)   Start  Dose/Rate Route Frequency Ordered Stop   04/09/18 1800  piperacillin-tazobactam (ZOSYN) IVPB 3.375 g     3.375 g 12.5 mL/hr over 240 Minutes Intravenous Every 8 hours 04/09/18 1710     04/09/18 1145  piperacillin-tazobactam (ZOSYN) IVPB 3.375 g     3.375 g 100 mL/hr over 30 Minutes Intravenous  Once 04/09/18 1134 04/09/18 1301      Alphonsa Overall, MD, FACS Pager: [facility] Office: [phone] 04/10/2018

## 2018-04-10 NOTE — Progress Notes (Signed)
Patient ID: Scott Rivera, male   DOB: 06-03-1950, 67 y.o.   MRN: [id number]  PROGRESS NOTE    Scott Rivera  URK:[id number] DOB: Dec 22, 1950 DOA: 04/09/2018 PCP: Jamesetta Orleans, PA-C   Brief Narrative:  67 y.o. 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 CT of the abdomen showed free intraperitoneal air concerning for perforated viscus along with right lower lobe infiltrate.  Antibiotics were started.  General surgery was consulted who recommended admission to hospitalist service.   Assessment & Plan:   Principal Problem:   Pneumoperitoneum Active Problems:   Perforated viscus   Essential hypertension   History of cerebral infarction   Pressure injury of skin   Dysphagia, post-stroke   Tobacco abuse   Tachypnea   Diabetes mellitus (HCC)   Leukocytosis   CKD (chronic kidney disease) stage 2, GFR 60-89 ml/min   Goals of care, counseling/discussion   Bilateral chronic knee pain   Mixed hyperlipidemia   Anemia due to chronic kidney disease   Compensated metabolic alkalosis   History of tracheostomy   Acute respiratory failure with hypoxia (HCC)   Foley catheter in place on admission   Aspiration pneumonia of right lower lobe (HCC)   Nausea and vomiting   Malnutrition of moderate degree   Tracheostomy tube present ([place])   Gastrostomy tube dependent ([place])   Gastroesophageal reflux disease without esophagitis   Chronic respiratory insufficiency   Probable perforated viscus presenting with coffee-ground emesis/probable upper GI bleeding and intraperitoneal free air  -General  surgery following.  Spoke with Dr. Lucia Gaskins.  For now the patient will be managed conservatively with IV fluids, IV antibiotics/Zosyn, n.p.o..  Patient might need repeat imaging studies in the next few days. -Protonix 40 mg IV every 12 hours for the concern of coffee-ground emesis/upper GI bleeding.  Hemoglobin has remained stable - Patient is a very high risk surgical candidate.  Overall prognosis is guarded to poor.  Recommend palliative care evaluation for goals of care.    Palliative care evaluation pending. -Patient remains full code at this time.  Right lower lobe bacterial pneumonia with a concern for aspiration pneumonia -Continue Zosyn.  Follow cultures.  Follow trach cultures.  Strep pneumoniae and Legionella urinary antigen.  Continue oxygen supplementation and wean off as able  Acute hypoxic respiratory failure in a patient with chronic tracheostomy -Currently still on 4 L oxygen via trach.  If respiratory status worsens, will need pulmonary evaluation.  Plan as above  Leukocytosis -Probably secondary to above.    Slightly improving.  Monitor  History of stroke with chronic bedridden status along with chronic tracheostomy and gastrostomy with chronic indwelling Foley's catheter -Overall prognosis very poor.  Palliative care evaluation.  Fall precautions    DVT prophylaxis: SCDs.  Avoid Lovenox because of probable perforated viscus and coffee-ground emesis Code Status: Full Family Communication:  None at bedside Disposition Plan: Depends on clinical outcome Consultants: General surgery Procedures: None  Antimicrobials: Zosyn from 04/09/2018 onwards   Subjective: Patient seen and examined at bedside.  He is nonverbal.  No overnight fever or vomiting reported by nursing staff.  Objective: Vitals:   04/10/18 0600 04/10/18 0700 04/10/18 0805 04/10/18 0900  BP:  122/63 128/61 (!) 152/76   Pulse: (!) 102 (!) 103  (!) 107  Resp: 19 16 (!) 27 18  Temp:      TempSrc:        SpO2: 100% 100%  95%  Weight:      Height:        Intake/Output Summary (Last 24 hours) at 04/10/2018 1049 Last data filed at 04/10/2018 0900 Gross per 24 hour  Intake 1987.22 ml  Output 1600 ml  Net 387.22 ml   Filed Weights   04/09/18 1549  Weight: 75.2 kg    Examination:  General exam: Looks older than stated age.  Nonverbal. Neck: Trach in place. Respiratory system: Bilateral decreased breath sounds at bases, with some scattered crackles, no wheezing.  Intermittent tachypnea Cardiovascular system: S1 & S2 heard, tachycardic Gastrointestinal system: Abdomen is nondistended, soft and nontender.  Bowel sounds sluggish.  Gastrostomy tube in place  extremities: No cyanosis, clubbing, edema  Central nervous system: Awake.  Nonverbal.  Nods his head to some questions.  Quadriplegic  Lymph: No cervical lymphadenopathy Genitourinary: Indwelling Foley catheter in place Psychiatry: Could not be assessed because of mental status    Data Reviewed: I have personally reviewed following labs and imaging studies  CBC: Recent Labs  Lab 04/09/18 0805 04/10/18 0802  WBC 14.2* 15.6*  NEUTROABS 11.3*  --   HGB 17.8* 15.4  HCT 58.9* 51.8  MCV 96.1 99.8  PLT 297 301   Basic Metabolic Panel: Recent Labs  Lab 04/09/18 0849 04/09/18 0905 04/10/18 0802  NA 138 137 145  K 5.2* 4.8 4.2  CL 100 98 107  CO2 22 29 27   GLUCOSE 79 97 78  BUN QUANTITY NOT SUFFICIENT, UNABLE TO PERFORM TEST 35* 25*  CREATININE QUANTITY NOT SUFFICIENT, UNABLE TO PERFORM TEST 1.14 1.13  CALCIUM 9.6 9.6 9.5  MG  --   --  2.1   GFR: Estimated Creatinine Clearance: 65.5 mL/min (by C-G formula based on SCr of 1.13 mg/dL). Liver Function Tests: Recent Labs  Lab 04/09/18 0849 04/09/18 0905 04/10/18 0802  AST 24 28 18   ALT QUANTITY NOT SUFFICIENT, UNABLE TO PERFORM TEST 16 14  ALKPHOS 96 97 79  BILITOT QUANTITY NOT SUFFICIENT, UNABLE TO PERFORM TEST 1.1 1.2  PROT QUANTITY NOT SUFFICIENT, UNABLE TO  PERFORM TEST 8.2* 8.0  ALBUMIN QUANTITY NOT SUFFICIENT, UNABLE TO PERFORM TEST 3.2* 3.1*   Recent Labs  Lab 04/09/18 0849  LIPASE 23   No results for input(s): AMMONIA in the last 168 hours. Coagulation Profile: No results for input(s): INR, PROTIME in the last 168 hours. Cardiac Enzymes: Recent Labs  Lab 04/09/18 0805  TROPONINI <0.03   BNP (last 3 results) No results for input(s): PROBNP in the last 8760 hours. HbA1C: No results for input(s): HGBA1C in the last 72 hours. CBG: Recent Labs  Lab 04/09/18 1319  GLUCAP 113*   Lipid Profile: No results for input(s): CHOL, HDL, LDLCALC, TRIG, CHOLHDL, LDLDIRECT in the last 72 hours. Thyroid Function Tests: No results for input(s): TSH, T4TOTAL, FREET4, T3FREE, THYROIDAB in the last 72 hours. Anemia Panel: No results for input(s): VITAMINB12, FOLATE, FERRITIN, TIBC, IRON, RETICCTPCT in the last 72 hours. Sepsis Labs: No results for input(s): PROCALCITON, LATICACIDVEN in the last 168 hours.  Recent Results (from the past 240 hour(s))  MRSA PCR Screening     Status: None   Collection Time: 04/09/18  5:35 PM  Result Value Ref Range Status   MRSA by PCR NEGATIVE NEGATIVE Final  Comment:        The GeneXpert MRSA Assay (FDA approved for NASAL specimens only), is one component of a comprehensive MRSA colonization surveillance program. It is not intended to diagnose MRSA infection nor to guide or monitor treatment for MRSA infections. Performed at [facility], [place] [address] [place], [place] [address]   Culture, blood (routine x 2)     Status: None (Preliminary result)   Collection Time: 04/09/18  6:30 PM  Result Value Ref Range Status   Specimen Description BLOOD LEFT HAND  Final   Special Requests   Final    BOTTLES DRAWN AEROBIC ONLY Blood Culture results may not be optimal due to an inadequate volume of blood received in culture bottles Performed at [facility], [place]  [address] [place], [place] [address]    Culture NO GROWTH < 12 HOURS  Final   Report Status PENDING  Incomplete  Culture, blood (routine x 2)     Status: None (Preliminary result)   Collection Time: 04/09/18  6:38 PM  Result Value Ref Range Status   Specimen Description BLOOD RIGHT ANTECUBITAL  Final   Special Requests   Final    BOTTLES DRAWN AEROBIC ONLY Blood Culture adequate volume Performed at [place] [address] [place], [place] [address]    Culture NO GROWTH < 12 HOURS  Final   Report Status PENDING  Incomplete         Radiology Studies: Dg Chest 2 View  Result Date: 04/09/2018 CLINICAL DATA:  Rhonchi.  Vomiting. EXAM: CHEST - 2 VIEW COMPARISON:  Chest radiograph January 01, 2018 and chest CT January 29, 2018 FINDINGS: There is a tracheostomy with tracheostomy catheter tip 5.3 cm above the carina. No pneumothorax. There is scarring in the left mid lung region. There is interstitial thickening in the lungs bilaterally, stable. There is no frank edema or consolidation. Heart size and pulmonary vascularity are normal. No adenopathy. There are surgical clips in the left upper thorax region. There is evidence of old trauma in the right shoulder region with remodeling. 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 IMPRESSION: Areas of scarring and probable chronic bronchitis. No frank edema or consolidation. Stable cardiac silhouette. Postoperative changes as noted. No pneumothorax. 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 Posttraumatic change with remodeling right shoulder region. Electronically Signed   By: Lowella Grip III M.D.   On: 04/09/2018 10:56   Ct Abdomen Pelvis W Contrast  Result Date: 04/09/2018 CLINICAL DATA:  Nausea, vomited blood during the night, aspirated, history stroke, stage III chronic kidney disease, hypertension EXAM: CT ABDOMEN AND PELVIS WITH CONTRAST TECHNIQUE: Multidetector CT imaging of the  abdomen and pelvis was performed using the standard protocol following bolus administration of intravenous contrast. Sagittal and coronal MPR images reconstructed from axial data set. CONTRAST:  153mL ISOVUE-300 IOPAMIDOL (ISOVUE-300) INJECTION 61% IV. No oral contrast. COMPARISON:  12/18/2017 FINDINGS: Lower chest: Minimal atelectasis LEFT base. RIGHT lower lobe infiltrate which could represent in pneumonia or aspiration. Hepatobiliary: Vague 9 mm nonspecific low-attenuation focus anteriorly within liver. No other focal hepatic abnormalities. Gallbladder unremarkable. No biliary dilatation. Pancreas: Normal appearance Spleen: Normal appearance Adrenals/Urinary Tract: Adrenal glands normal appearance. Absent LEFT kidney question congenital absence versus prior nephrectomy. RIGHT kidney and ureter normal appearance. Bladder decompressed by Foley catheter. Stomach/Bowel: Significantly increased stool within rectum with additional increased stool in ascending and transverse colon. Appendix not definitely visualized. Gastrostomy tube in stomach. Mild  wall thickening of the distal esophagus question esophagitis versus mass, though distal esophageal injury from vomiting could cause wall thickening. Stomach and remaining bowel loops unremarkable. Vascular/Lymphatic: Extensive atherosclerotic calcifications aorta and iliac arteries without aneurysm. No adenopathy. Reproductive: No prostatic enlargement. Other: Free air identified in upper abdomen. Unless patient has had recent intra-abdominal surgery or recent placement of gastrostomy tube, findings would be consistent with a perforated viscus. No free fluid. Small BILATERAL inguinal hernias containing fat larger on LEFT. Musculoskeletal: Diffuse osseous demineralization. Avascular necrosis changes of the femoral heads bilaterally. Degenerative disc disease changes lumbar spine. IMPRESSION: Free intraperitoneal air; unless patient has had a recent intra-abdominal procedure  or recent placement of the gastrostomy tube, findings are consistent with a perforated viscus. Increased stool in rectum as well as the proximal half of the colon. Wall thickening of distal esophagus which could be related to esophagitis, mass, or, in a patient with a history of vomiting, distal esophageal injury. RIGHT lower lobe infiltrate question pneumonia versus aspiration. Small BILATERAL inguinal hernias containing fat. Critical Value/emergent results were called by telephone at the time of interpretation on 04/09/2018 at 11:11 am to Dr. Orlie Dakin , who verbally acknowledged these results. Electronically Signed   By: Lavonia Dana M.D.   On: 04/09/2018 11:11   Dg Chest Port 1 View  Result Date: 04/10/2018 CLINICAL DATA:  Dyspnea. EXAM: PORTABLE CHEST 1 VIEW COMPARISON:  04/09/2018 FINDINGS: 0500 hours. Tracheostomy tube again noted. Left lung remains clear. Interval progression of airspace disease at the right base. Interstitial markings are diffusely coarsened with chronic features. The cardiopericardial silhouette is within normal limits for size. Bones are diffusely demineralized. Telemetry leads overlie the chest. IMPRESSION: Interval progression of airspace disease at the right base. Given the interval increase in asymmetric elevation right hemidiaphragm a component of the opacity at the right base may be atelectatic. Electronically Signed   By: Misty Stanley M.D.   On: 04/10/2018 07:45        Scheduled Meds: . chlorhexidine  15 mL Mouth Rinse BID  . mouth rinse  15 mL Mouth Rinse q12n4p  . pantoprazole (PROTONIX) IV  40 mg Intravenous Q12H   Continuous Infusions: . sodium chloride Stopped (04/10/18 0300)  . piperacillin-tazobactam (ZOSYN)  IV 3.375 g (04/10/18 1042)     LOS: 1 day        Aline August, MD [facility] Pager [phone]  If 7PM-7AM, please contact night-coverage [email or web address] Password [facility] 04/10/2018, 10:49 AM

## 2018-04-10 NOTE — Consult Note (Signed)
Consultation Note Date: 04/10/2018   Patient Name: Scott Rivera  DOB: 19-Feb-1951  MRN: [id number]  Age / Sex: 67 y.o., male  PCP: Jamesetta Orleans, PA-C Referring Physician: Aline August, MD  Reason for Consultation: Establishing goals of care  HPI/Patient Profile: 67 y.o. male  with past medical history of   admitted on 04/09/2018 with  .   Clinical Assessment and Goals of Care:  68 year old gentleman with hypertension dyslipidemia history of stroke status post tracheostomy and PEG tube placement, nonverbal status, stage III chronic kidney disease, aspiration pneumonia. Patient known to palliative medicine team and seen by several providers during previous hospitalizations for goals of care discussions during palliative inpatient consultations.  Patient was admitted a few months ago with aspiration pneumonia, fecal impaction possible esophagitis.  Reportedly, patient lives at home with his sister with the assistance of caregivers. Patient has been admitted to hospital medicine service with surgical specialists also following, in the stepdown unit at [facility] for pneumoperitoneum, perforated viscus, right lower lobe infiltrate. CT abdomen showed free intraperitoneal air concerning for perforated viscus. It is reported that the patient was brought into the hospital because of possible coffee-ground emesis.  Patient is awake alert resting in bed. He is able to track me in the room. He is nonverbal. He keeps pointing to his toes. There is no family present at the bedside. Chart reviewed in detail.  Please note additional recommendations as listed below. Palliative medicine team to continue to follow along.  HCPOA  sister [name].   SUMMARY OF RECOMMENDATIONS    remains full code, full scope at this time. Call placed, unable to reach sister [name] at [phone]. Will re attempt, after  the holiday, on 04-11-18. Patient known to PMT, seen by several of our providers during patient's hospitalizations in the past for stroke, pneumonia etc. During those meetings, the patient's HCPOA sister [name] has always voiced for goals being of a full code/full scope nature. Will need to re discuss goals when sister available.  Continue current mode of care for now.  Thank you for the consult.   Code Status/Advance Care Planning:  Full code    Symptom Management:    as above   Palliative Prophylaxis:   Delirium Protocol  Additional Recommendations (Limitations, Scope, Preferences):  Full Scope Treatment  Psycho-social/Spiritual:   Desire for further Chaplaincy support:yes  Additional Recommendations: Caregiving  Support/Resources  Prognosis:   Unable to determine  Discharge Planning: To Be Determined      Primary Diagnoses: Present on Admission: . Perforated viscus . Acute respiratory failure with hypoxia ([place]) . Anemia due to chronic kidney disease . Pneumoperitoneum . (Resolved) Benign essential HTN . Bilateral chronic knee pain . CKD (chronic kidney disease) stage 2, GFR 60-89 ml/min . Chronic respiratory insufficiency . Compensated metabolic alkalosis . Essential hypertension . Gastroesophageal reflux disease without esophagitis . Malnutrition of moderate degree . Mixed hyperlipidemia . Nausea and vomiting . Pressure injury of skin . Tobacco abuse . Tachypnea   I  have reviewed the medical record, interviewed the patient and family, and examined the patient. The following aspects are pertinent.  Past Medical History:  Diagnosis Date  . Acute pulmonary edema (HCC)   . Anemia due to chronic kidney disease   . ARF (acute renal failure) ([place]) 10/19/2016  . Aspiration pneumonia ([place])   . Cerebellar infarct ([place]) 10/19/2016  . Chronic kidney disease, stage 3 (moderate) (HCC)   . CKD (chronic kidney disease) stage 2, GFR 60-89 ml/min 01/02/2017  . Disease  characterized by destruction of skeletal muscle 10/15/2016  . Gastrostomy tube obstruction ([place])   . HCAP (healthcare-associated pneumonia)   . Helicobacter pylori infection 09/21/2012  . History of adenomatous polyp of colon 07/28/2015  . Hypertension   . MDR Acinetobacter baumannii infection   . Pneumothorax, closed, traumatic, initial encounter 12/10/2016  . Renal insufficiency   . Rhabdomyolysis 10/19/2016  . S/P percutaneous endoscopic gastrostomy (PEG) tube placement ([place]) 03/30/2017  . Stage III pressure ulcer of sacral region ([place]) 12/17/2016  . Stroke ([place])   . UTI (urinary tract infection) 03/30/2017   Social History   Socioeconomic History  . Marital status: Single    Spouse name: Not on file  . Number of children: Not on file  . Years of education: Not on file  . Highest education level: Not on file  Occupational History  . Not on file  Social Needs  . Financial resource strain: Not hard at all  . Food insecurity:    Worry: Not on file    Inability: Never true  . Transportation needs:    Medical: No    Non-medical: No  Tobacco Use  . Smoking status: Former Research scientist (life sciences)  . Smokeless tobacco: Never Used  . Tobacco comment: Smoked heavily until the day of his stroke  Substance and Sexual Activity  . Alcohol use: Not Currently    Comment: Drank heavily prior to stroke per sister  . Drug use: Not Currently  . Sexual activity: Not Currently  Lifestyle  . Physical activity:    Days per week: Not on file    Minutes per session: Not on file  . Stress: Not on file  Relationships  . Social connections:    Talks on phone: Not on file    Gets together: Not on file    Attends religious service: Not on file    Active member of club or organization: Not on file    Attends meetings of clubs or organizations: Not on file    Relationship status: Not on file  Other Topics Concern  . Not on file  Social History Narrative   Lives at home with sister and private home care   Family  History  Problem Relation Age of Onset  . Diabetes Mellitus II Brother   . CAD Neg Hx   . Stroke Neg Hx    Scheduled Meds: . chlorhexidine  15 mL Mouth Rinse BID  . mouth rinse  15 mL Mouth Rinse q12n4p  . pantoprazole (PROTONIX) IV  40 mg Intravenous Q12H   Continuous Infusions: . sodium chloride Stopped (04/10/18 0300)  . piperacillin-tazobactam (ZOSYN)  IV 3.375 g (04/10/18 1042)   PRN Meds:.ipratropium-albuterol, ondansetron **OR** ondansetron (ZOFRAN) IV Medications Prior to Admission:  Prior to Admission medications   Medication Sig Start Date End Date Taking? Authorizing Provider  acetaminophen (TYLENOL) 500 MG tablet Place 1,000 mg into feeding tube 3 (three) times daily.    Yes [provider]  amLODipine (NORVASC) 10 MG tablet  Place 1 tablet (10 mg total) into feeding tube daily. 11/23/17  Yes Kayleen Memos, DO  aspirin 81 MG chewable tablet Place 1 tablet (81 mg total) into feeding tube daily. 04/05/17  Yes Nita Sells, MD  atropine 1 % ophthalmic solution Place 2 drops under the tongue every 8 (eight) hours as needed (excessive secretion). Patient taking differently: Place 2 drops under the tongue 2 (two) times daily.  07/03/17  Yes Lavina Hamman, MD  bisacodyl (DULCOLAX) 10 MG suppository Place 1 suppository (10 mg total) rectally daily. 12/22/17  Yes Thurnell Lose, MD  cloNIDine (CATAPRES - DOSED IN MG/24 HR) 0.1 mg/24hr patch Place 1 patch (0.1 mg total) onto the skin once a week. Patient taking differently: Place 0.1 mg onto the skin every Tuesday.  04/10/17  Yes Nita Sells, MD  clopidogrel (PLAVIX) 75 MG tablet Place 1 tablet (75 mg total) into feeding tube daily. 01/11/17  Yes Cherene Altes, MD  Ferrous Sulfate (IRON) 325 (65 Fe) MG TABS Take by mouth.   Yes [provider]  gabapentin (NEURONTIN) 100 MG capsule 100 mg See admin instructions. Open 1 capsule (100 mg) and administer contents via feeding tube three times daily    Yes [provider]  ipratropium-albuterol (DUONEB) 0.5-2.5 (3) MG/3ML SOLN Take 3 mLs by nebulization every 4 (four) hours as needed. Patient taking differently: Take 3 mLs by nebulization every 4 (four) hours as needed (shortness of breath/wheezing).  01/10/17  Yes Cherene Altes, MD  metoprolol tartrate (LOPRESSOR) 50 MG tablet Take 1 tablet (50 mg total) by mouth 2 (two) times daily. 12/21/17  Yes Thurnell Lose, MD  montelukast (SINGULAIR) 10 MG tablet Place 10 mg into feeding tube at bedtime.    Yes [provider]  mouth rinse LIQD solution 10 mLs by Mouth Rinse route 4 (four) times daily.   Yes [provider]  Nutritional Supplements (FEEDING SUPPLEMENT, JEVITY 1.2 CAL,) LIQD Place 1,000 mLs into feeding tube continuous. 11/22/17  Yes Hall, Carole N, DO  pantoprazole (PROTONIX) 40 MG tablet Take 1 tablet (40 mg total) by mouth daily. 12/21/17  Yes Thurnell Lose, MD  pravastatin (PRAVACHOL) 20 MG tablet Place 1 tablet (20 mg total) into feeding tube daily at 6 PM. Patient taking differently: Place 20 mg daily into feeding tube.  01/10/17  Yes Cherene Altes, MD  thiamine 100 MG tablet Place 1 tablet (100 mg total) into feeding tube daily. 01/11/17  Yes Cherene Altes, MD  vitamin C (ASCORBIC ACID) 500 MG tablet Take 500 mg by mouth daily.   Yes [provider]  Water For Irrigation, Sterile (FREE WATER) SOLN Place 100 mLs into feeding tube daily. Patient taking differently: Place 100 mLs into feeding tube every 3 (three) hours.  07/03/17  Yes Lavina Hamman, MD  Amino Acids-Protein Hydrolys (FEEDING SUPPLEMENT, PRO-STAT SUGAR FREE 64,) LIQD Take 30 mLs by mouth at bedtime.    [provider]  senna (SENOKOT) 8.6 MG TABS tablet Take 1 tablet by mouth daily.    [provider]   No Known Allergies Review of Systems Non verbal to me  Physical Exam Awake alert but non verbal He does follow commands and tracks me in the  room Diminished breath sounds S 1 S 2  Abdomen soft, has G tube Appears weak Has foley  Vital Signs: BP (!) 152/76   Pulse (!) 111   Temp (!) 97.3 F (36.3 C) (Oral)   Resp  18   Ht 5\' 10"  (1.778 m)   Wt 75.2 kg   SpO2 98%   BMI 23.79 kg/m  Pain Scale: 0-10   Pain Score: 0-No pain   SpO2: SpO2: 98 % O2 Device:SpO2: 98 % O2 Flow Rate: .O2 Flow Rate (L/min): 4 L/min  IO: Intake/output summary:   Intake/Output Summary (Last 24 hours) at 04/10/2018 1323 Last data filed at 04/10/2018 0900 Gross per 24 hour  Intake 1987.22 ml  Output 1600 ml  Net 387.22 ml   PPS 40% LBM: Last BM Date: 04/09/18 Baseline Weight: Weight: 75.2 kg Most recent weight: Weight: 75.2 kg     Palliative Assessment/Data:   PPS 30%  Time In:  10 Time Out:  11 Time Total:  60  Greater than 50%  of this time was spent counseling and coordinating care related to the above assessment and plan.  Signed by: Loistine Chance, MD  [phone] Please contact Palliative Medicine Team phone at [phone] for questions and concerns.  For individual provider: See Shea Evans

## 2018-04-11 ENCOUNTER — Inpatient Hospital Stay (HOSPITAL_COMMUNITY): Payer: Medicare HMO

## 2018-04-11 DIAGNOSIS — I10 Essential (primary) hypertension: Secondary | ICD-10-CM

## 2018-04-11 LAB — CBC WITH DIFFERENTIAL/PLATELET
Abs Immature Granulocytes: 0.05 10*3/uL (ref 0.00–0.07)
Basophils Absolute: 0.1 10*3/uL (ref 0.0–0.1)
Basophils Relative: 0 %
Eosinophils Absolute: 0.1 10*3/uL (ref 0.0–0.5)
Eosinophils Relative: 1 %
HCT: 48.5 % (ref 39.0–52.0)
Hemoglobin: 14.1 g/dL (ref 13.0–17.0)
Immature Granulocytes: 0 %
Lymphocytes Relative: 9 %
Lymphs Abs: 1.2 10*3/uL (ref 0.7–4.0)
MCH: 28.8 pg (ref 26.0–34.0)
MCHC: 29.1 g/dL — ABNORMAL LOW (ref 30.0–36.0)
MCV: 99.2 fL (ref 80.0–100.0)
Monocytes Absolute: 1.8 10*3/uL — ABNORMAL HIGH (ref 0.1–1.0)
Monocytes Relative: 14 %
Neutro Abs: 9.7 10*3/uL — ABNORMAL HIGH (ref 1.7–7.7)
Neutrophils Relative %: 76 %
Platelets: 288 10*3/uL (ref 150–400)
RBC: 4.89 MIL/uL (ref 4.22–5.81)
RDW: 14.6 % (ref 11.5–15.5)
WBC: 12.8 10*3/uL — ABNORMAL HIGH (ref 4.0–10.5)
nRBC: 0 % (ref 0.0–0.2)

## 2018-04-11 LAB — COMPREHENSIVE METABOLIC PANEL
ALT: 14 U/L (ref 0–44)
AST: 17 U/L (ref 15–41)
Albumin: 3.1 g/dL — ABNORMAL LOW (ref 3.5–5.0)
Alkaline Phosphatase: 71 U/L (ref 38–126)
Anion gap: 12 (ref 5–15)
BUN: 15 mg/dL (ref 8–23)
CO2: 24 mmol/L (ref 22–32)
Calcium: 9.6 mg/dL (ref 8.9–10.3)
Chloride: 111 mmol/L (ref 98–111)
Creatinine, Ser: 0.91 mg/dL (ref 0.61–1.24)
GFR calc Af Amer: >60 mL/min (ref >60)
GFR calc non Af Amer: >60 mL/min (ref >60)
Glucose, Bld: 78 mg/dL (ref 70–99)
Potassium: 3.7 mmol/L (ref 3.5–5.1)
Sodium: 147 mmol/L — ABNORMAL HIGH (ref 135–145)
Total Bilirubin: 1.2 mg/dL (ref 0.3–1.2)
Total Protein: 7.3 g/dL (ref 6.5–8.1)

## 2018-04-11 LAB — BLOOD CULTURE ID PANEL (REFLEXED)

## 2018-04-11 LAB — EXPECTORATED SPUTUM ASSESSMENT W GRAM STAIN, RFLX TO RESP C

## 2018-04-11 LAB — LEGIONELLA PNEUMOPHILA SEROGP 1 UR AG: L. pneumophila Serogp 1 Ur Ag: NEGATIVE

## 2018-04-11 LAB — MAGNESIUM: Magnesium: 2 mg/dL (ref 1.7–2.4)

## 2018-04-11 IMAGING — DX DG CHEST 1V PORT
1 series · 1 of 1 positions shown · non-contrast
Comparison: Chest x-rays dated 02/24/2017 and 01/07/2017.

CLINICAL DATA: Cough and congestion. History of pleural effusion.
Tracheostomy.

EXAM:
PORTABLE CHEST 1 VIEW

[chest ap]
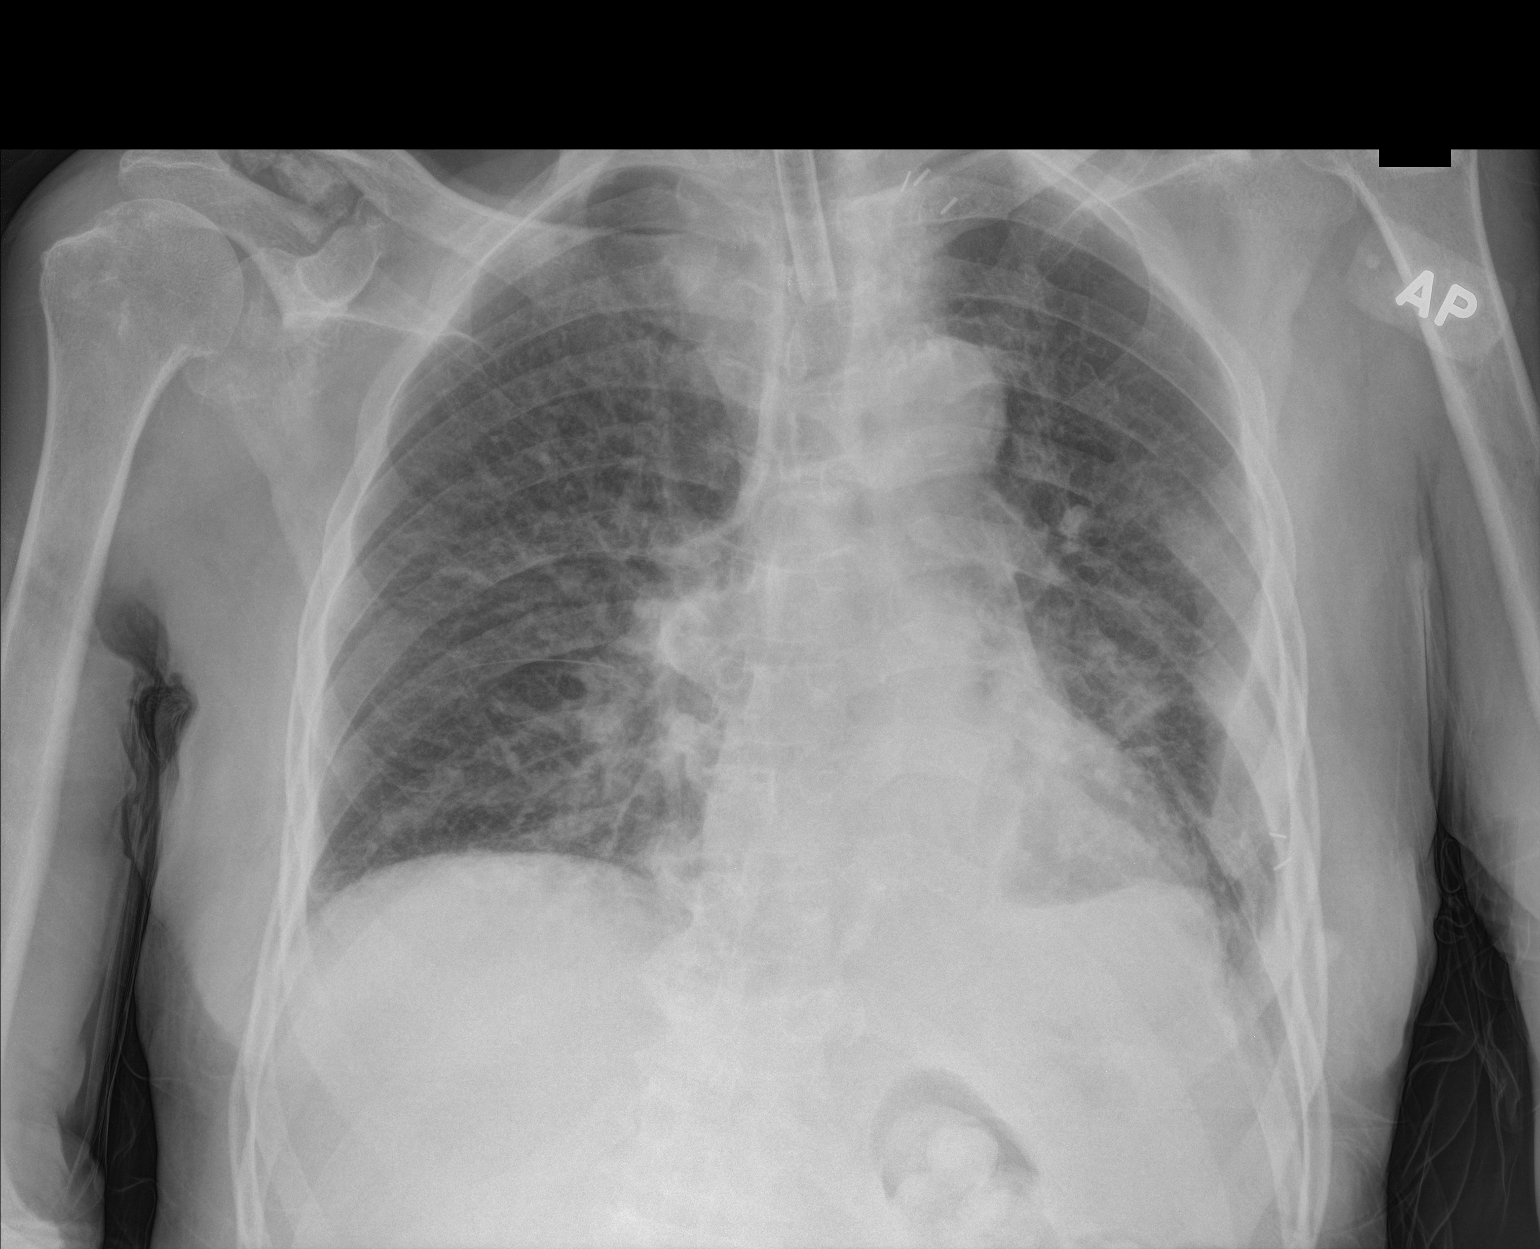

[1 of 1 positions shown; findings below may reference images not displayed]

FINDINGS: Heart size and mediastinal contours are stable. Tracheostomy tube
appears appropriately positioned in the midline.

Interstitial markings are more prominent on today's study suggesting
interstitial edema. Persistent opacity along the left lateral chest
wall, compatible with the loculated appearing effusion demonstrated
on chest CT of 12/29/2016.
IMPRESSION: 1. Probable bilateral interstitial edema suggesting mild CHF/volume
overload.
2. Persistent opacity along the left lateral chest wall, similar to
earlier chest x-rays of 01/07/2017 and 12/31/2016, compatible with
the loculated appearing pleural effusion demonstrated on chest CT of
12/29/2016.
3. Tracheostomy tube appears appropriately positioned in the
midline.

## 2018-04-11 IMAGING — CT CT ANGIO CHEST
3 of 9 series · 17 of 36 positions shown · IV contrast (iopamidol)
Comparison: Chest radiograph dated 03/02/2017

ADDENDUM:
There is occluded appearance of the visualized origin of the left
vertebral artery, age indeterminate, possibly chronic. If there is
clinical concern for acute occlusion or acute infarct further
evaluation with CT angiography of the head and neck recommended.
CLINICAL DATA: 66-year-old male with tracheostomy fluid and concern
for fracture of tracheostomy. Patient presenting with respiratory
findings concerning for PE.

EXAM:
CT ANGIOGRAPHY CHEST WITH CONTRAST
TECHNIQUE: Multidetector CT imaging of the chest was performed using the
standard protocol during bolus administration of intravenous
contrast. Multiplanar CT image reconstructions and MIPs were
obtained to evaluate the vascular anatomy.
CONTRAST:  100mL VTTOAH-AIF IOPAMIDOL (VTTOAH-AIF) INJECTION 76%

[Series 5: pe thins · axial · 0.75mm/px · z∈[+138,+398]mm · 14 of 301 slices shown]
[im 21/301  lung]
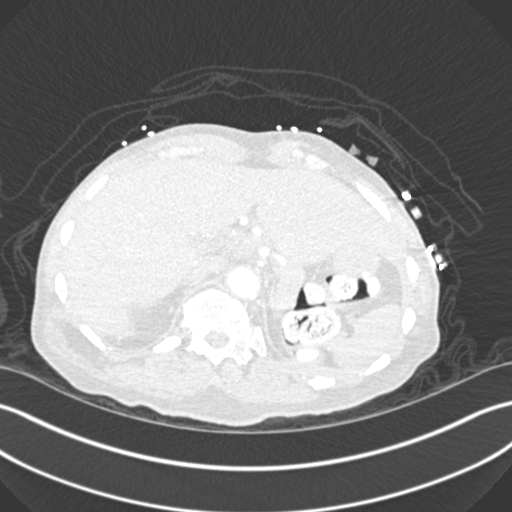
[im 41/301  mediastinal]
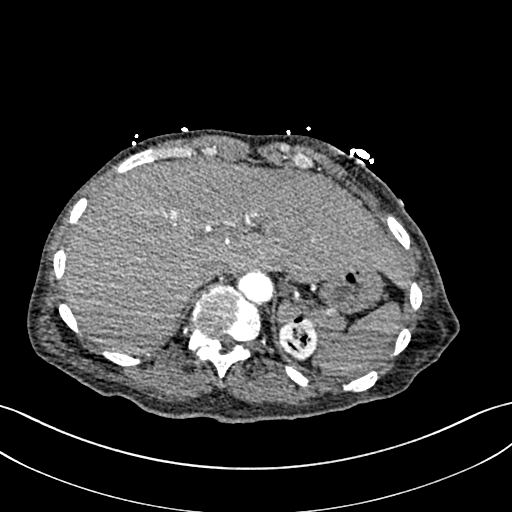
[im 61/301  lung]
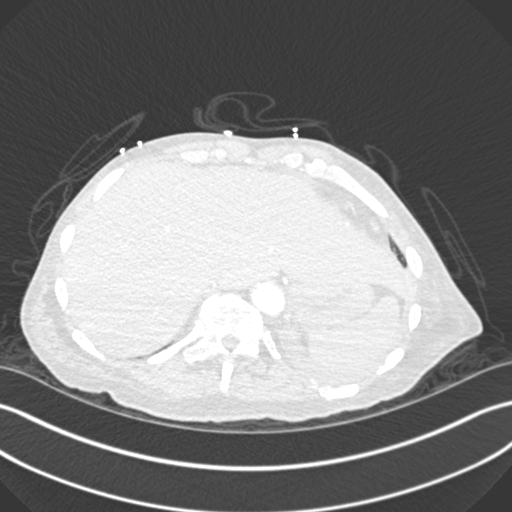
[im 81/301  mediastinal]
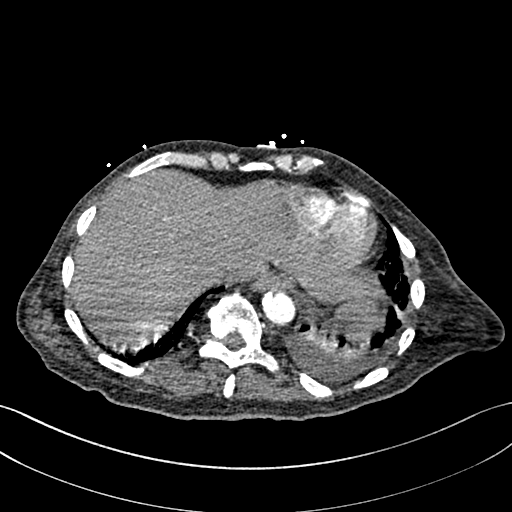
[im 101/301  lung]
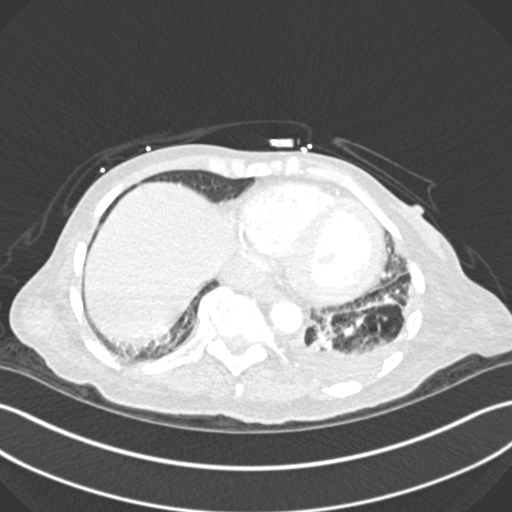
[im 121/301  mediastinal]
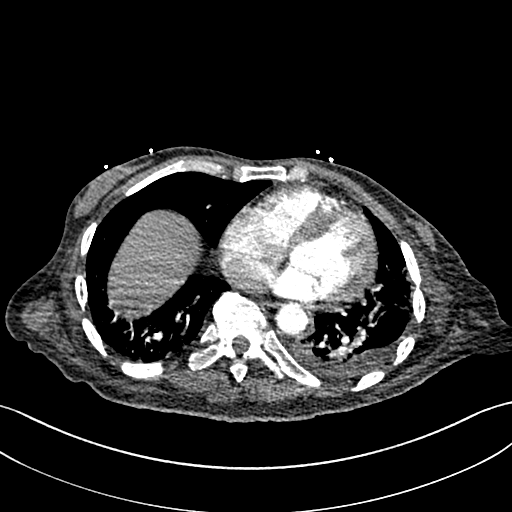
[im 141/301  lung]
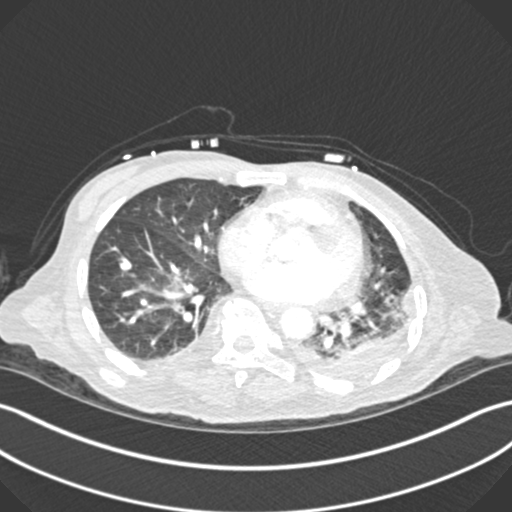
[im 161/301  mediastinal]
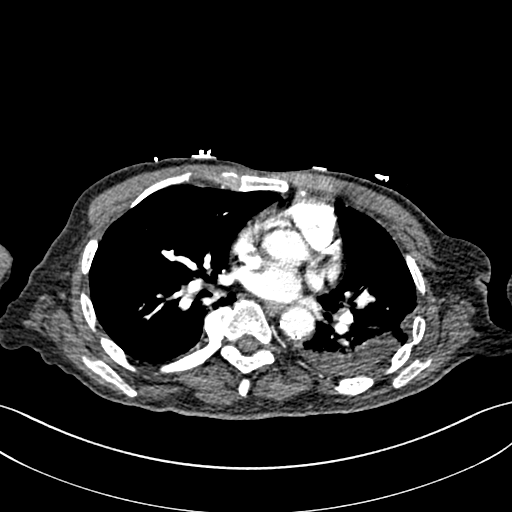
[im 181/301  lung]
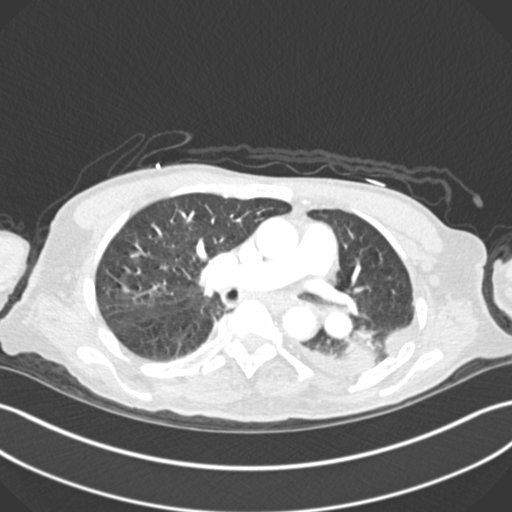
[im 201/301  mediastinal]
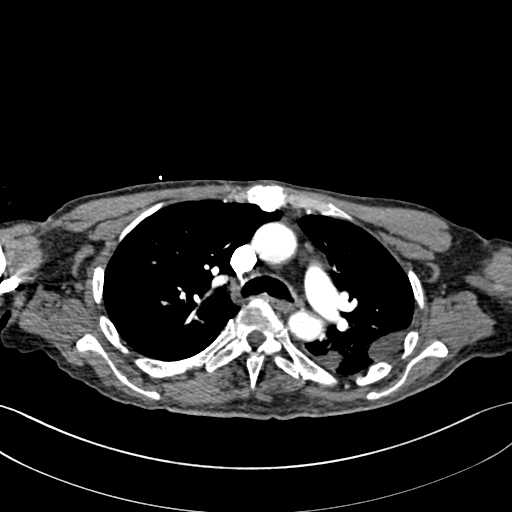
[im 221/301  lung]
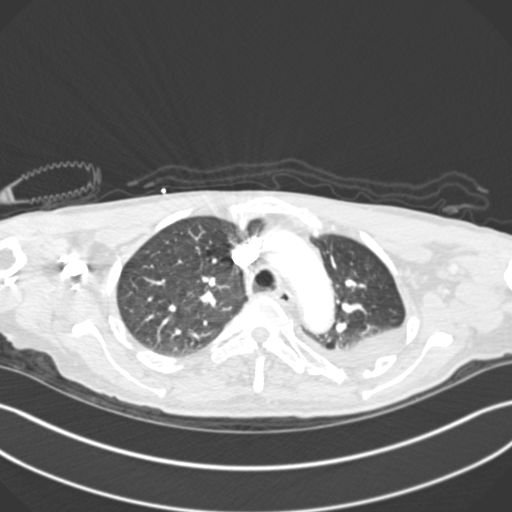
[im 241/301  mediastinal]
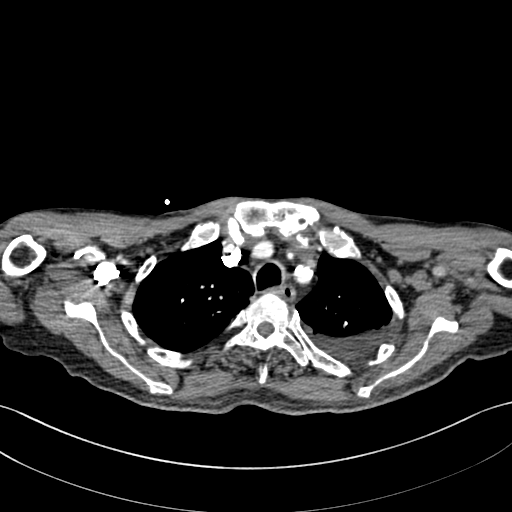
[im 261/301  lung]
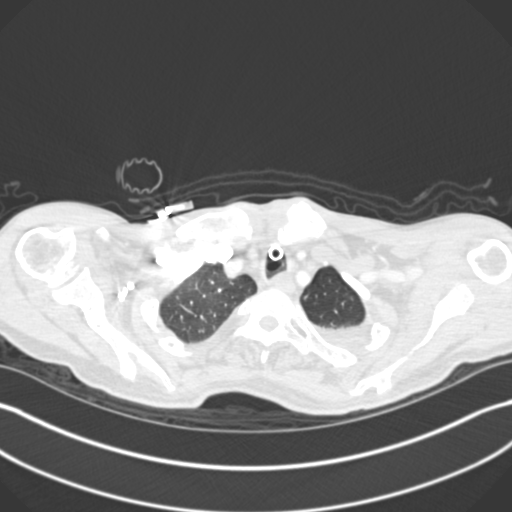
[im 281/301  mediastinal]
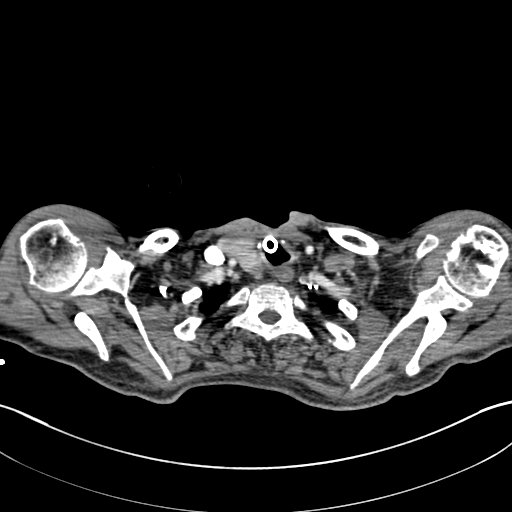

[Series 6: pe lung · axial · 0.98mm/px · z∈[+238,+298]mm · 2 of 80 slices shown]
[im 20/80  mediastinal]
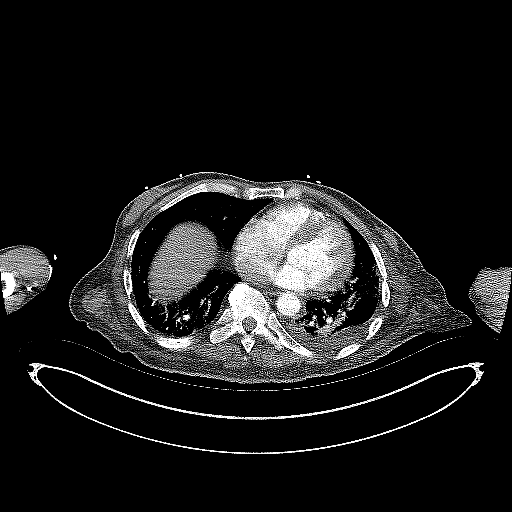
[im 40/80  mediastinal]
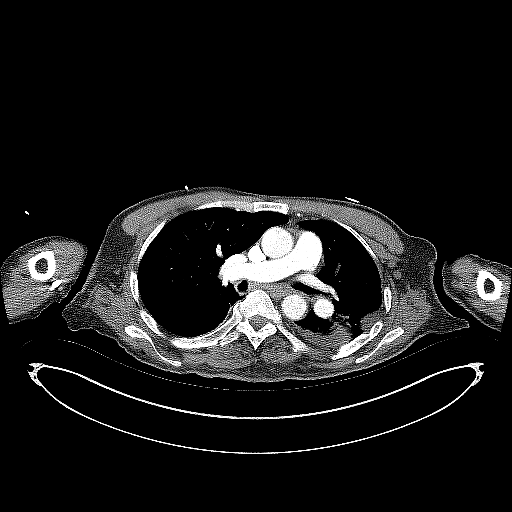

[Series 7: pe coronal mpr · coronal · 0.60mm/px · 1 of 108 slices shown]
[im 54/108  mediastinal]
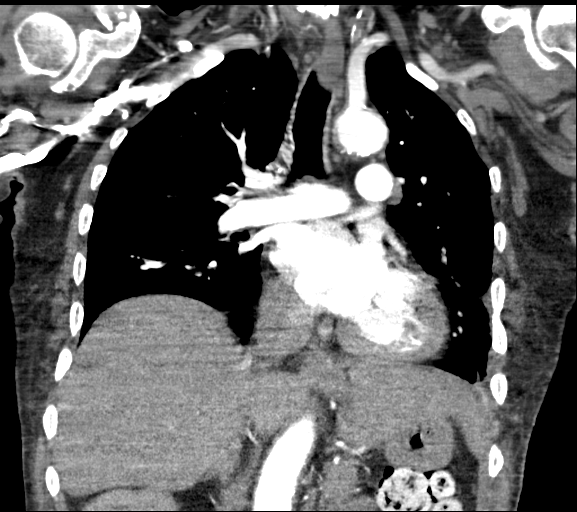

[17 of 36 positions shown; findings below may reference images not displayed]

FINDINGS: Cardiovascular: There is no cardiomegaly or pericardial effusion.
Coronary vascular calcification primarily involving the LAD. Focal
area of coarse calcification in the anterior pericardium similar to
prior CT. There is moderate atherosclerotic calcification of the
thoracic aorta. There is atherosclerotic calcification of the origin
of the left common carotid artery. The visualized proximal portion
of the left vertebral artery appears occluded, age indeterminate,
likely chronic. If there is clinical concern for acute conclusion or
acute infarct further evaluation with CT angiography of the head and
neck recommended. Evaluation of the pulmonary arteries is somewhat
limited due to respiratory motion artifact and suboptimal
visualization of the peripheral branches. No CT evidence of
pulmonary artery embolus in the visualized segment of the
vasculature.

Mediastinum/Nodes: There is no hilar or mediastinal adenopathy. The
esophagus is grossly unremarkable. The left thyroid gland is not
visualized, likely surgically absent. Multiple mildly enlarged
supraclavicular lymph nodes noted bilaterally measuring up to 12 mm
on the left. Clinical correlation is recommended.

Lungs/Pleura: There is a background of mild emphysema. Bibasilar
linear atelectasis/scarring noted. There is a small loculated
appearing pleural effusion in the left upper lobe posteriorly
extending to the lingula. A small pleural effusion is seen in the
left lower lobe similar to prior CT. Overall there has been improved
aeration of the left lung base compared to the prior CT. There is no
pneumothorax. Mucus secretion material noted in the trachea and
bilateral mainstem bronchi. A tracheostomy is noted with tip above
the carina.

Upper Abdomen: An ill-defined 12 mm low attenuating focus in the
anterior right lobe of the liver is not well characterized.
Evaluation of the upper abdomen is limited due to streak artifact
caused by patient's arms.

Musculoskeletal: There is degenerative changes of the spine.
Avascular necrosis of the visualized left humeral neck. No acute
osseous pathology.

Review of the MIP images confirms the above findings.
IMPRESSION: 1. No CT evidence of central pulmonary artery embolus.
2. Loculated pleural effusion in the left upper lobe as well as a
small left lower lobe pleural effusion similar to prior CT. Overall
improvement of the aeration of the left lung base compared to the
prior CT.
3. Tracheostomy with tip above the carina. There is mucus secretion
content within the central airways.
4. Aortic Atherosclerosis (OGMHO-NTB.[id number]) and Emphysema ([id number].2).
5. Mildly enlarged bilateral supraclavicular lymph nodes. Clinical
correlation is recommended.

## 2018-04-11 MED ORDER — LORAZEPAM 2 MG/ML IJ SOLN
1.0000 mg | Freq: Once | INTRAMUSCULAR | Status: AC
  Administered 2018-04-11: 1 mg via INTRAVENOUS
  Filled 2018-04-11: qty 1

## 2018-04-11 MED ORDER — SODIUM CHLORIDE 0.9 % IV SOLN
INTRAVENOUS | Status: DC | PRN
  Administered 2018-04-11 – 2018-04-15 (×2): 250 mL via INTRAVENOUS

## 2018-04-11 MED ORDER — CHLORHEXIDINE GLUCONATE 0.12 % MT SOLN
15.0000 mL | Freq: Two times a day (BID) | OROMUCOSAL | Status: DC
  Administered 2018-04-11 – 2018-04-15 (×7): 15 mL via OROMUCOSAL
  Filled 2018-04-11 (×8): qty 15

## 2018-04-11 MED ORDER — ORAL CARE MOUTH RINSE
15.0000 mL | Freq: Two times a day (BID) | OROMUCOSAL | Status: DC
  Administered 2018-04-11 – 2018-04-16 (×8): 15 mL via OROMUCOSAL

## 2018-04-11 MED ORDER — DEXTROSE 5 % IV SOLN
INTRAVENOUS | Status: DC
  Administered 2018-04-11 – 2018-04-15 (×6): via INTRAVENOUS

## 2018-04-11 MED ORDER — METOPROLOL TARTRATE 5 MG/5ML IV SOLN
2.5000 mg | Freq: Four times a day (QID) | INTRAVENOUS | Status: DC | PRN
  Administered 2018-04-11 – 2018-04-16 (×2): 2.5 mg via INTRAVENOUS
  Filled 2018-04-11 (×2): qty 5

## 2018-04-11 MED ORDER — HYDRALAZINE HCL 20 MG/ML IJ SOLN
5.0000 mg | Freq: Four times a day (QID) | INTRAMUSCULAR | Status: DC | PRN
  Administered 2018-04-11 – 2018-04-16 (×4): 5 mg via INTRAVENOUS
  Filled 2018-04-11 (×4): qty 1

## 2018-04-11 MED ORDER — FUROSEMIDE 10 MG/ML IJ SOLN
40.0000 mg | Freq: Once | INTRAMUSCULAR | Status: AC
  Administered 2018-04-11: 40 mg via INTRAVENOUS
  Filled 2018-04-11: qty 4

## 2018-04-11 MED ORDER — HYDRALAZINE HCL 20 MG/ML IJ SOLN
5.0000 mg | Freq: Once | INTRAMUSCULAR | Status: AC
  Administered 2018-04-11: 5 mg via INTRAVENOUS
  Filled 2018-04-11: qty 1

## 2018-04-11 NOTE — Progress Notes (Signed)
[facility]:  [phone] General Surgery Progress Note   LOS: 2 days  POD -     Chief Complaint: pneumoperitoneum  Assessment and Plan: 1.  Pneumoperitoneum             On Zosyn - 12/24 >>>  WBC - 12,800 - 04/11/2018  Clinically looks good.  No obvious abdominal pain.    Plan:  Repeat CT scan tomorrow with oral contrast through G tube to evaluate GI tract  2.  Possible aspiration - right lower lobe infiltrate on CT scan 3.  Tracheostomy - chronic 4.  Gastrostomy tube 5.  Chronic indwelling foley 6.  CVA - originally in July 2018             He was hospitalized in [facility]             Multiple prior infarcts seen on CT of head 08/17/2017  7.  Small bilateral inguinal hernias             Seen on CT scan - sub clinical 8.  Large amount of stool in rectum             History of constipation 9.  Absent left kidney 10.  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  On Plavix - which is on hold 12.  Seen by palliative care - according to nurse, family "not ready" to meet with them   Principal Problem:   Pneumoperitoneum Active Problems:   Essential hypertension   History of cerebral infarction   Pressure injury of skin   Dysphagia, post-stroke   Tobacco abuse   Tachypnea   Diabetes mellitus (HCC)   Leukocytosis   CKD (chronic kidney disease) stage 2, GFR 60-89 ml/min   Goals of care, counseling/discussion   Bilateral chronic knee pain   Mixed hyperlipidemia   Anemia due to chronic kidney disease   Compensated metabolic alkalosis   History of tracheostomy   Acute respiratory failure with hypoxia (HCC)   Foley catheter in place on admission   Aspiration pneumonia of right lower lobe (HCC)   Nausea and vomiting   Malnutrition of moderate degree   Perforated viscus   Tracheostomy tube present ([place])   Gastrostomy tube dependent (HCC)   Gastroesophageal reflux disease without esophagitis   Chronic respiratory  insufficiency   Subjective:  A little sleepy this AM, but arouses.  No family at bedside.  Objective:   Vitals:   04/11/18 0342 04/11/18 0509  BP:  (!) 153/76  Pulse:  (!) 111  Resp:  20  Temp: 98.9 F (37.2 C)   SpO2:  (!) 87%     Intake/Output from previous day:  12/25 0701 - 12/26 0700 In: 1705.5 [I.V.:1553.8; IV Piggyback:151.7] Out: 1750 [Urine:1300; Drains:450]  Intake/Output this shift:  No intake/output data recorded.   Physical Exam:   General: Thin older AA M who is alert and oriented.    HEENT: Normal. Pupils equal. .   Lungs: Bilateral rhonchi   Abdomen: Gastrostomy tube in LUQ.  No localized pain or discomfort.     Lab Results:    Recent Labs    04/10/18 0802 04/11/18 0523  WBC 15.6* 12.8*  HGB 15.4 14.1  HCT 51.8 48.5  PLT 280 288    BMET   Recent Labs    04/10/18 0802 04/11/18 0523  NA 145 147*  K 4.2 3.7  CL 107 111  CO2 27 24  GLUCOSE 78 78  BUN 25* 15  CREATININE 1.13 0.91  CALCIUM 9.5 9.6    PT/INR  No results for input(s): LABPROT, INR in the last 72 hours.  ABG   Recent Labs    04/09/18 0819 04/09/18 0847  PHART 7.368 7.378  HCO3 32.0* 33.3*     Studies/Results:  Dg Chest 2 View  Result Date: 04/09/2018 CLINICAL DATA:  Rhonchi.  Vomiting. EXAM: CHEST - 2 VIEW COMPARISON:  Chest radiograph January 01, 2018 and chest CT January 29, 2018 FINDINGS: There is a tracheostomy with tracheostomy catheter tip 5.3 cm above the carina. No pneumothorax. There is scarring in the left mid lung region. There is interstitial thickening in the lungs bilaterally, stable. There is no frank edema or consolidation. Heart size and pulmonary vascularity are normal. No adenopathy. There are surgical clips in the left upper thorax region. There is evidence of old trauma in the right shoulder region with remodeling. 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 IMPRESSION: Areas of scarring and probable chronic bronchitis. No frank  edema or consolidation. Stable cardiac silhouette. Postoperative changes as noted. No pneumothorax. 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 Posttraumatic change with remodeling right shoulder region. Electronically Signed   By: Lowella Grip III M.D.   On: 04/09/2018 10:56   Ct Abdomen Pelvis W Contrast  Result Date: 04/09/2018 CLINICAL DATA:  Nausea, vomited blood during the night, aspirated, history stroke, stage III chronic kidney disease, hypertension EXAM: CT ABDOMEN AND PELVIS WITH CONTRAST TECHNIQUE: Multidetector CT imaging of the abdomen and pelvis was performed using the standard protocol following bolus administration of intravenous contrast. Sagittal and coronal MPR images reconstructed from axial data set. CONTRAST:  173mL ISOVUE-300 IOPAMIDOL (ISOVUE-300) INJECTION 61% IV. No oral contrast. COMPARISON:  12/18/2017 FINDINGS: Lower chest: Minimal atelectasis LEFT base. RIGHT lower lobe infiltrate which could represent in pneumonia or aspiration. Hepatobiliary: Vague 9 mm nonspecific low-attenuation focus anteriorly within liver. No other focal hepatic abnormalities. Gallbladder unremarkable. No biliary dilatation. Pancreas: Normal appearance Spleen: Normal appearance Adrenals/Urinary Tract: Adrenal glands normal appearance. Absent LEFT kidney question congenital absence versus prior nephrectomy. RIGHT kidney and ureter normal appearance. Bladder decompressed by Foley catheter. Stomach/Bowel: Significantly increased stool within rectum with additional increased stool in ascending and transverse colon. Appendix not definitely visualized. Gastrostomy tube in stomach. Mild wall thickening of the distal esophagus question esophagitis versus mass, though distal esophageal injury from vomiting could cause wall thickening. Stomach and remaining bowel loops unremarkable. Vascular/Lymphatic: Extensive atherosclerotic calcifications aorta and iliac arteries without aneurysm. No adenopathy.  Reproductive: No prostatic enlargement. Other: Free air identified in upper abdomen. Unless patient has had recent intra-abdominal surgery or recent placement of gastrostomy tube, findings would be consistent with a perforated viscus. No free fluid. Small BILATERAL inguinal hernias containing fat larger on LEFT. Musculoskeletal: Diffuse osseous demineralization. Avascular necrosis changes of the femoral heads bilaterally. Degenerative disc disease changes lumbar spine. IMPRESSION: Free intraperitoneal air; unless patient has had a recent intra-abdominal procedure or recent placement of the gastrostomy tube, findings are consistent with a perforated viscus. Increased stool in rectum as well as the proximal half of the colon. Wall thickening of distal esophagus which could be related to esophagitis, mass, or, in a patient with a history of vomiting, distal esophageal injury. RIGHT lower lobe infiltrate question pneumonia versus aspiration. Small BILATERAL inguinal hernias containing fat. Critical Value/emergent results were called by telephone at the time of interpretation on 04/09/2018 at 11:11 am to Dr. Orlie Dakin , who verbally acknowledged these results. Electronically  Signed   By: Lavonia Dana M.D.   On: 04/09/2018 11:11   Dg Chest Port 1 View  Result Date: 04/10/2018 CLINICAL DATA:  Dyspnea. EXAM: PORTABLE CHEST 1 VIEW COMPARISON:  04/09/2018 FINDINGS: 0500 hours. Tracheostomy tube again noted. Left lung remains clear. Interval progression of airspace disease at the right base. Interstitial markings are diffusely coarsened with chronic features. The cardiopericardial silhouette is within normal limits for size. Bones are diffusely demineralized. Telemetry leads overlie the chest. IMPRESSION: Interval progression of airspace disease at the right base. Given the interval increase in asymmetric elevation right hemidiaphragm a component of the opacity at the right base may be atelectatic. Electronically  Signed   By: Misty Stanley M.D.   On: 04/10/2018 07:45     Anti-infectives:   Anti-infectives (From admission, onward)   Start     Dose/Rate Route Frequency Ordered Stop   04/09/18 1800  piperacillin-tazobactam (ZOSYN) IVPB 3.375 g     3.375 g 12.5 mL/hr over 240 Minutes Intravenous Every 8 hours 04/09/18 1710     04/09/18 1145  piperacillin-tazobactam (ZOSYN) IVPB 3.375 g     3.375 g 100 mL/hr over 30 Minutes Intravenous  Once 04/09/18 1134 04/09/18 1301      Alphonsa Overall, MD, FACS Pager: [place]: [phone] 04/11/2018

## 2018-04-11 NOTE — Progress Notes (Signed)
PMT progress note   Patient seen briefly, resting in bed, in no distress  Chart reviewed  BP (!) 145/72   Pulse (!) 103   Temp 98.3 F (36.8 C) (Axillary)   Resp 15   Ht 5\' 10"  (1.778 m)   Wt 75.2 kg   SpO2 92%   BMI 23.79 kg/m  Labs and imaging noted  Frail appearing gentleman He is awake Coarse breath sounds Has G tube No edema  PPS 30%  A/P: Pneumoperitoneum Possible aspiration Chronic foley Has PEG History of stroke  Call placed and discussed with sister [name]. She endorses full code, full scope. She recalls meeting with palliative care team members in the past. I reviewed with her regarding the patient's current condition, also informed her about possible CT scan abdomen tomorrow. She'd like to know the results of the CT abdomen tomorrow, before speaking with me further.   I will call her again, on 04-12-18. PMT to continue to follow along.   25 minutes spent [facility] palliative medicine team [phone]

## 2018-04-11 NOTE — Progress Notes (Signed)
Attending MD paged earlier in regards to elevated BP. New orders received for PRN HTN medication. Patient's BP now elevated again despite PRN HTN medication. Unable to administer again until around 10pm. MD made aware. Awaiting additional orders.  Will continue to monitor.

## 2018-04-11 NOTE — Progress Notes (Signed)
PHARMACY - PHYSICIAN COMMUNICATION CRITICAL VALUE ALERT - BLOOD CULTURE IDENTIFICATION (BCID)  Scott Rivera is an 67 y.o. male who presented to [facility] on 04/09/2018 after being found with large amount of black vomit, CT concerning for RLL infiltrate and perforated viscus.  Surgery is consulted, plans to repeat CT scan tomorrow.  PMH significant for HTN, HLD, stroke s/p trach and PEG, nonverbal status, CKD III, GERD, aspiration pneumonia (12/2017), fecal impaction and possible esophagitis.   Assessment:  1/4 bottles (aerobic bottle only) with GPC Afebrile, WBC elevated at 12.8 but improved.  Name of physician (or Provider) Contacted: Dr Starla Link  Current antibiotics: Zosyn  Changes to prescribed antibiotics recommended:  Patient is on recommended antibiotics - No changes needed  Results for orders placed or performed during the hospital encounter of 04/09/18  Blood Culture ID Panel (Reflexed) (Collected: 04/09/2018  6:38 PM)  Result Value Ref Range   Enterococcus species NOT DETECTED NOT DETECTED   Listeria monocytogenes NOT DETECTED NOT DETECTED   Staphylococcus species NOT DETECTED NOT DETECTED   Staphylococcus aureus (BCID) NOT DETECTED NOT DETECTED   Streptococcus species NOT DETECTED NOT DETECTED   Streptococcus agalactiae NOT DETECTED NOT DETECTED   Streptococcus pneumoniae NOT DETECTED NOT DETECTED   Streptococcus pyogenes NOT DETECTED NOT DETECTED   Acinetobacter baumannii NOT DETECTED NOT DETECTED   Enterobacteriaceae species NOT DETECTED NOT DETECTED   Enterobacter cloacae complex NOT DETECTED NOT DETECTED   Escherichia coli NOT DETECTED NOT DETECTED   Klebsiella oxytoca NOT DETECTED NOT DETECTED   Klebsiella pneumoniae NOT DETECTED NOT DETECTED   Proteus species NOT DETECTED NOT DETECTED   Serratia marcescens NOT DETECTED NOT DETECTED   Haemophilus influenzae NOT DETECTED NOT DETECTED   Neisseria meningitidis NOT DETECTED NOT DETECTED   Pseudomonas aeruginosa NOT  DETECTED NOT DETECTED   Candida albicans NOT DETECTED NOT DETECTED   Candida glabrata NOT DETECTED NOT DETECTED   Candida krusei NOT DETECTED NOT DETECTED   Candida parapsilosis NOT DETECTED NOT DETECTED   Candida tropicalis NOT DETECTED NOT DETECTED    Gretta Arab PharmD, BCPS Pager [phone] 04/11/2018 2:09 PM

## 2018-04-11 NOTE — Progress Notes (Signed)
Patient ID: Scott Rivera, male   DOB: May 03, 1950, 67 y.o.   MRN: [id number]  PROGRESS NOTE    Scott Rivera  WFU:[id number] DOB: [birth year] DOA: 04/09/2018 PCP: Jamesetta Orleans, PA-C   Brief Narrative:  67 y.o. 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 CT of the abdomen showed free intraperitoneal air concerning for perforated viscus along with right lower lobe infiltrate.  Antibiotics were started.  General surgery was consulted who recommended admission to hospitalist service.   Assessment & Plan:   Principal Problem:   Pneumoperitoneum Active Problems:   Perforated viscus   Essential hypertension   History of cerebral infarction   Pressure injury of skin   Dysphagia, post-stroke   Tobacco abuse   Tachypnea   Diabetes mellitus (HCC)   Leukocytosis   CKD (chronic kidney disease) stage 2, GFR 60-89 ml/min   Goals of care, counseling/discussion   Bilateral chronic knee pain   Mixed hyperlipidemia   Anemia due to chronic kidney disease   Compensated metabolic alkalosis   History of tracheostomy   Acute respiratory failure with hypoxia (HCC)   Foley catheter in place on admission   Aspiration pneumonia of right lower lobe (HCC)   Nausea and vomiting   Malnutrition of moderate degree   Tracheostomy tube present ([place])   Gastrostomy tube dependent ([place])   Gastroesophageal reflux disease without esophagitis   Chronic respiratory insufficiency   Probable perforated viscus presenting with coffee-ground emesis/probable upper GI bleeding and intraperitoneal free air  -General  surgery following.  Spoke with Dr. Lucia Gaskins.  For now the patient will be managed conservatively with IV fluids, IV antibiotics/Zosyn, n.p.o.. We will probably repeat a CAT scan of the abdomen and pelvis tomorrow. -Protonix 40 mg IV every 12 hours for the concern of coffee-ground emesis/upper GI bleeding.  Hemoglobin has remained stable - Patient is a very high risk surgical candidate.  Overall prognosis is guarded to poor.  Recommend palliative care evaluation for goals of care.    Palliative care evaluation pending. -Patient remains full code at this time.  Right lower lobe bacterial pneumonia with a concern for aspiration pneumonia -Continue Zosyn.  Follow cultures.  Follow trach cultures.  Strep pneumoniae and Legionella urinary antigen.  Continue oxygen supplementation and wean off as able  Acute hypoxic respiratory failure in a patient with chronic tracheostomy -Worsening.  Currently on oxygen via trach at 8 L/min.  Will get repeat portable chest x-ray. -We will give a dose of intravenous Lasix  Hypernatremia -Change IV fluids to D5 at 50 cc an hour  Leukocytosis -Probably secondary to above.    Slightly improving.  Monitor  History of stroke with chronic bedridden status along with chronic tracheostomy and gastrostomy with chronic indwelling Foley's catheter -Overall prognosis very poor.  Palliative care evaluation is pending.  Fall precautions    DVT prophylaxis: SCDs.  Avoid Lovenox because of probable perforated viscus and coffee-ground emesis Code Status: Full Family Communication:  None at bedside Disposition Plan: Depends on clinical outcome Consultants: General surgery Procedures: None  Antimicrobials: Zosyn from 04/09/2018 onwards   Subjective: Patient seen and examined at bedside.  He is nonverbal but nods his head to some  questions.  No overnight fever or vomiting reported. Objective: Vitals:   04/11/18 0342 04/11/18 0509 04/11/18 0800 04/11/18 0801  BP:   (!) 153/76 132/65   Pulse:  (!) 111 (!) 102 (!) 105  Resp:  20 18 20   Temp: 98.9 F (37.2 C)  98.1 F (36.7 C)   TempSrc: Axillary  Oral   SpO2:  (!) 87% 95% 96%  Weight:      Height:        Intake/Output Summary (Last 24 hours) at 04/11/2018 0949 Last data filed at 04/11/2018 0800 Gross per 24 hour  Intake 1703.54 ml  Output 1750 ml  Net -46.46 ml   Filed Weights   04/09/18 1549  Weight: 75.2 kg    Examination:  General exam: Looks older than stated age.  Nonverbal.  No acute distress Neck: Tracheostomy in place Respiratory system: Bilateral decreased breath sounds at bases, with some scattered crackles. Cardiovascular system: Tachycardia, S1-S2 heard Gastrointestinal system: Abdomen is slightly distended, soft and nontender.  Bowel sounds sluggish.  Gastrostomy tube in place  extremities: No cyanosis; edema  Central nervous system: Awake.  Nonverbal.  Nods his head to some questions.  Quadriplegic  Lymph: No cervical lymphadenopathy Genitourinary: Indwelling Foley catheter in place Psychiatry: Could not be assessed because of mental status    Data Reviewed: I have personally reviewed following labs and imaging studies  CBC: Recent Labs  Lab 04/09/18 0805 04/10/18 0802 04/11/18 0523  WBC 14.2* 15.6* 12.8*  NEUTROABS 11.3*  --  9.7*  HGB 17.8* 15.4 14.1  HCT 58.9* 51.8 48.5  MCV 96.1 99.8 99.2  PLT 297 280 086   Basic Metabolic Panel: Recent Labs  Lab 04/09/18 0849 04/09/18 0905 04/10/18 0802 04/11/18 0523  NA 138 137 145 147*  K 5.2* 4.8 4.2 3.7  CL 100 98 107 111  CO2 22 29 27 24   GLUCOSE 79 97 78 78  BUN QUANTITY NOT SUFFICIENT, UNABLE TO PERFORM TEST 35* 25* 15  CREATININE QUANTITY NOT SUFFICIENT, UNABLE TO PERFORM TEST 1.14 1.13 0.91  CALCIUM 9.6 9.6 9.5 9.6  MG  --   --  2.1 2.0   GFR: Estimated Creatinine Clearance: 81.3 mL/min (by C-G formula based on SCr of 0.91 mg/dL). Liver Function Tests: Recent Labs  Lab 04/09/18 0849  04/09/18 0905 04/10/18 0802 04/11/18 0523  AST 24 28 18 17   ALT QUANTITY NOT SUFFICIENT, UNABLE TO PERFORM TEST 16 14 14   ALKPHOS 96 97 79 71  BILITOT QUANTITY NOT SUFFICIENT, UNABLE TO PERFORM TEST 1.1 1.2 1.2  PROT QUANTITY NOT SUFFICIENT, UNABLE TO PERFORM TEST 8.2* 8.0 7.3  ALBUMIN QUANTITY NOT SUFFICIENT, UNABLE TO PERFORM TEST 3.2* 3.1* 3.1*   Recent Labs  Lab 04/09/18 0849  LIPASE 23   No results for input(s): AMMONIA in the last 168 hours. Coagulation Profile: No results for input(s): INR, PROTIME in the last 168 hours. Cardiac Enzymes: Recent Labs  Lab 04/09/18 0805  TROPONINI <0.03   BNP (last 3 results) No results for input(s): PROBNP in the last 8760 hours. HbA1C: No results for input(s): HGBA1C in the last 72 hours. CBG: Recent Labs  Lab 04/09/18 1319  GLUCAP 113*   Lipid Profile: No results for input(s): CHOL, HDL, LDLCALC, TRIG, CHOLHDL, LDLDIRECT in the last 72 hours. Thyroid Function Tests: No results for input(s): TSH, T4TOTAL, FREET4, T3FREE, THYROIDAB in the last 72 hours. Anemia Panel: No results for input(s): VITAMINB12, FOLATE, FERRITIN, TIBC, IRON, RETICCTPCT in the last 72 hours. Sepsis Labs:  No results for input(s): PROCALCITON, LATICACIDVEN in the last 168 hours.  Recent Results (from the past 240 hour(s))  MRSA PCR Screening     Status: None   Collection Time: 04/09/18  5:35 PM  Result Value Ref Range Status   MRSA by PCR NEGATIVE NEGATIVE Final    Comment:        The GeneXpert MRSA Assay (FDA approved for NASAL specimens only), is one component of a comprehensive MRSA colonization surveillance program. It is not intended to diagnose MRSA infection nor to guide or monitor treatment for MRSA infections. Performed at [facility], [place] [address], [place], [place] [address]   Culture, blood (routine x 2)     Status: None (Preliminary result)   Collection Time: 04/09/18  6:30 PM  Result Value Ref Range Status    Specimen Description   Final    BLOOD LEFT HAND Performed at [facility] [address], [place], [place] [address]    Special Requests   Final    BOTTLES DRAWN AEROBIC ONLY Blood Culture results may not be optimal due to an inadequate volume of blood received in culture bottles Performed at [facility] [address] [place], [place] [address]    Culture   Final    NO GROWTH < 24 HOURS Performed at [facility] [address] [place], [place] [address]    Report Status PENDING  Incomplete  Culture, blood (routine x 2)     Status: None (Preliminary result)   Collection Time: 04/09/18  6:38 PM  Result Value Ref Range Status   Specimen Description   Final    BLOOD RIGHT ANTECUBITAL Performed at [facility] [address] [place], [place] [address]    Special Requests   Final    BOTTLES DRAWN AEROBIC ONLY Blood Culture adequate volume Performed at [facility] [address], [place], [place] [address]    Culture  Setup Time   Final    GRAM POSITIVE COCCI AEROBIC BOTTLE ONLY Organism ID to follow    Culture   Final    NO GROWTH < 24 HOURS Performed at [facility], [place] [address] [place], [place] [address]    Report Status PENDING  Incomplete         Radiology Studies: Dg Chest 2 View  Result Date: 04/09/2018 CLINICAL DATA:  Rhonchi.  Vomiting. EXAM: CHEST - 2 VIEW COMPARISON:  Chest radiograph January 01, 2018 and chest CT January 29, 2018 FINDINGS: There is a tracheostomy with tracheostomy catheter tip 5.3 cm above the carina. No pneumothorax. There is scarring in the left mid lung region. There is interstitial thickening in the lungs bilaterally, stable. There is no frank edema or consolidation. Heart size and pulmonary vascularity are normal. No adenopathy. There are surgical clips in the left upper thorax region. There is evidence of old trauma in the right shoulder  region with remodeling. 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 IMPRESSION: Areas of scarring and probable chronic bronchitis. No frank edema or consolidation. Stable cardiac silhouette. Postoperative changes as noted. No pneumothorax. 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 Posttraumatic change with remodeling right shoulder region. Electronically Signed   By: Lowella Grip III M.D.   On: 04/09/2018 10:56   Ct Abdomen Pelvis W Contrast  Result Date: 04/09/2018 CLINICAL DATA:  Nausea, vomited blood during the night, aspirated, history stroke, stage III chronic kidney  disease, hypertension EXAM: CT ABDOMEN AND PELVIS WITH CONTRAST TECHNIQUE: Multidetector CT imaging of the abdomen and pelvis was performed using the standard protocol following bolus administration of intravenous contrast. Sagittal and coronal MPR images reconstructed from axial data set. CONTRAST:  122mL ISOVUE-300 IOPAMIDOL (ISOVUE-300) INJECTION 61% IV. No oral contrast. COMPARISON:  12/18/2017 FINDINGS: Lower chest: Minimal atelectasis LEFT base. RIGHT lower lobe infiltrate which could represent in pneumonia or aspiration. Hepatobiliary: Vague 9 mm nonspecific low-attenuation focus anteriorly within liver. No other focal hepatic abnormalities. Gallbladder unremarkable. No biliary dilatation. Pancreas: Normal appearance Spleen: Normal appearance Adrenals/Urinary Tract: Adrenal glands normal appearance. Absent LEFT kidney question congenital absence versus prior nephrectomy. RIGHT kidney and ureter normal appearance. Bladder decompressed by Foley catheter. Stomach/Bowel: Significantly increased stool within rectum with additional increased stool in ascending and transverse colon. Appendix not definitely visualized. Gastrostomy tube in stomach. Mild wall thickening of the distal esophagus question esophagitis versus mass, though distal esophageal injury from vomiting could cause wall thickening. Stomach  and remaining bowel loops unremarkable. Vascular/Lymphatic: Extensive atherosclerotic calcifications aorta and iliac arteries without aneurysm. No adenopathy. Reproductive: No prostatic enlargement. Other: Free air identified in upper abdomen. Unless patient has had recent intra-abdominal surgery or recent placement of gastrostomy tube, findings would be consistent with a perforated viscus. No free fluid. Small BILATERAL inguinal hernias containing fat larger on LEFT. Musculoskeletal: Diffuse osseous demineralization. Avascular necrosis changes of the femoral heads bilaterally. Degenerative disc disease changes lumbar spine. IMPRESSION: Free intraperitoneal air; unless patient has had a recent intra-abdominal procedure or recent placement of the gastrostomy tube, findings are consistent with a perforated viscus. Increased stool in rectum as well as the proximal half of the colon. Wall thickening of distal esophagus which could be related to esophagitis, mass, or, in a patient with a history of vomiting, distal esophageal injury. RIGHT lower lobe infiltrate question pneumonia versus aspiration. Small BILATERAL inguinal hernias containing fat. Critical Value/emergent results were called by telephone at the time of interpretation on 04/09/2018 at 11:11 am to Dr. Orlie Dakin , who verbally acknowledged these results. Electronically Signed   By: Lavonia Dana M.D.   On: 04/09/2018 11:11   Dg Chest Port 1 View  Result Date: 04/10/2018 CLINICAL DATA:  Dyspnea. EXAM: PORTABLE CHEST 1 VIEW COMPARISON:  04/09/2018 FINDINGS: 0500 hours. Tracheostomy tube again noted. Left lung remains clear. Interval progression of airspace disease at the right base. Interstitial markings are diffusely coarsened with chronic features. The cardiopericardial silhouette is within normal limits for size. Bones are diffusely demineralized. Telemetry leads overlie the chest. IMPRESSION: Interval progression of airspace disease at the right  base. Given the interval increase in asymmetric elevation right hemidiaphragm a component of the opacity at the right base may be atelectatic. Electronically Signed   By: Misty Stanley M.D.   On: 04/10/2018 07:45        Scheduled Meds: . chlorhexidine  15 mL Mouth Rinse BID  . mouth rinse  15 mL Mouth Rinse q12n4p  . pantoprazole (PROTONIX) IV  40 mg Intravenous Q12H   Continuous Infusions: . sodium chloride 75 mL/hr at 04/10/18 1057  . piperacillin-tazobactam (ZOSYN)  IV Stopped (04/11/18 0600)     LOS: 2 days        Aline August, MD [facility] Pager [phone]  If 7PM-7AM, please contact night-coverage [email or web address] Password [facility] 04/11/2018, 9:49 AM

## 2018-04-11 NOTE — Progress Notes (Signed)
New orders received for elevated BP.

## 2018-04-12 ENCOUNTER — Inpatient Hospital Stay (HOSPITAL_COMMUNITY): Payer: Medicare HMO

## 2018-04-12 LAB — CBC WITH DIFFERENTIAL/PLATELET
Abs Immature Granulocytes: 0.06 10*3/uL (ref 0.00–0.07)
Basophils Absolute: 0.1 10*3/uL (ref 0.0–0.1)
Basophils Relative: 1 %
Eosinophils Absolute: 0.4 10*3/uL (ref 0.0–0.5)
Eosinophils Relative: 3 %
HCT: 49.1 % (ref 39.0–52.0)
Hemoglobin: 15.3 g/dL (ref 13.0–17.0)
Immature Granulocytes: 1 %
Lymphocytes Relative: 13 %
Lymphs Abs: 1.6 10*3/uL (ref 0.7–4.0)
MCH: 29.1 pg (ref 26.0–34.0)
MCHC: 31.2 g/dL (ref 30.0–36.0)
MCV: 93.3 fL (ref 80.0–100.0)
Monocytes Absolute: 1.7 10*3/uL — ABNORMAL HIGH (ref 0.1–1.0)
Monocytes Relative: 13 %
Neutro Abs: 9 10*3/uL — ABNORMAL HIGH (ref 1.7–7.7)
Neutrophils Relative %: 69 %
Platelets: 327 10*3/uL (ref 150–400)
RBC: 5.26 MIL/uL (ref 4.22–5.81)
RDW: 14.4 % (ref 11.5–15.5)
WBC: 12.8 10*3/uL — ABNORMAL HIGH (ref 4.0–10.5)
nRBC: 0 % (ref 0.0–0.2)

## 2018-04-12 LAB — COMPREHENSIVE METABOLIC PANEL
ALT: 12 U/L (ref 0–44)
AST: 22 U/L (ref 15–41)
Albumin: 2.8 g/dL — ABNORMAL LOW (ref 3.5–5.0)
Alkaline Phosphatase: 69 U/L (ref 38–126)
Anion gap: 11 (ref 5–15)
BUN: 13 mg/dL (ref 8–23)
CO2: 27 mmol/L (ref 22–32)
Calcium: 9.8 mg/dL (ref 8.9–10.3)
Chloride: 106 mmol/L (ref 98–111)
Creatinine, Ser: 0.97 mg/dL (ref 0.61–1.24)
GFR calc Af Amer: >60 mL/min (ref >60)
GFR calc non Af Amer: >60 mL/min (ref >60)
Glucose, Bld: 95 mg/dL (ref 70–99)
Potassium: 3.9 mmol/L (ref 3.5–5.1)
Sodium: 144 mmol/L (ref 135–145)
Total Bilirubin: 1.5 mg/dL — ABNORMAL HIGH (ref 0.3–1.2)
Total Protein: 8 g/dL (ref 6.5–8.1)

## 2018-04-12 LAB — MAGNESIUM: Magnesium: 1.6 mg/dL — ABNORMAL LOW (ref 1.7–2.4)

## 2018-04-12 MED ORDER — SODIUM CHLORIDE (PF) 0.9 % IJ SOLN
INTRAMUSCULAR | Status: AC
  Administered 2018-04-12: 12:00:00
  Filled 2018-04-12: qty 50

## 2018-04-12 MED ORDER — FUROSEMIDE 10 MG/ML IJ SOLN
60.0000 mg | Freq: Once | INTRAMUSCULAR | Status: AC
  Administered 2018-04-12: 60 mg via INTRAVENOUS
  Filled 2018-04-12: qty 6

## 2018-04-12 MED ORDER — IOHEXOL 300 MG/ML  SOLN
100.0000 mL | Freq: Once | INTRAMUSCULAR | Status: AC | PRN
  Administered 2018-04-12: 100 mL via INTRAVENOUS

## 2018-04-12 MED ORDER — LORAZEPAM 2 MG/ML IJ SOLN
1.0000 mg | INTRAMUSCULAR | Status: DC | PRN
  Administered 2018-04-12 – 2018-04-15 (×2): 1 mg via INTRAVENOUS
  Filled 2018-04-12 (×2): qty 1

## 2018-04-12 NOTE — Progress Notes (Signed)
   Subjective/Chief Complaint: Resting comfortably. Denies any pain   Objective: Vital signs in last 24 hours: Temp:  [98.1 F (36.7 C)-98.9 F (37.2 C)] 98.8 F (37.1 C) (12/27 0304) Pulse Rate:  [98-120] 99 (12/27 0435) Resp:  [12-24] 15 (12/27 0435) BP: (132-192)/(65-156) 164/75 (12/27 0435) SpO2:  [90 %-100 %] 95 % (12/27 0435) FiO2 (%):  [40 %] 40 % (12/27 0312) Weight:  [73 kg] 73 kg (12/27 0200) Last BM Date: 04/11/18  Intake/Output from previous day: 12/26 0701 - 12/27 0700 In: 1006.9 [I.V.:873.3; IV Piggyback:133.7] Out: 2300 [Urine:2050; Drains:250] Intake/Output this shift: No intake/output data recorded.  General appearance: alert and cooperative Resp: clear to auscultation bilaterally Cardio: regular rate and rhythm GI: soft, nontender  Lab Results:  Recent Labs    04/10/18 0802 04/11/18 0523  WBC 15.6* 12.8*  HGB 15.4 14.1  HCT 51.8 48.5  PLT 280 288   BMET Recent Labs    04/10/18 0802 04/11/18 0523  NA 145 147*  K 4.2 3.7  CL 107 111  CO2 27 24  GLUCOSE 78 78  BUN 25* 15  CREATININE 1.13 0.91  CALCIUM 9.5 9.6   PT/INR No results for input(s): LABPROT, INR in the last 72 hours. ABG Recent Labs    04/09/18 0819 04/09/18 0847  PHART 7.368 7.378  HCO3 32.0* 33.3*    Studies/Results: Dg Chest Port 1 View  Result Date: 04/11/2018 CLINICAL DATA:  Shortness of breath and cough. EXAM: PORTABLE CHEST 1 VIEW COMPARISON:  Chest x-ray from yesterday. FINDINGS: Unchanged tracheostomy tube. Stable cardiomediastinal silhouette. Normal pulmonary vascularity. Increased elevation of the right hemidiaphragm. Right basilar airspace disease is similar to prior study. The left lung is clear. No large pleural effusion. No pneumothorax. No acute osseous abnormality. IMPRESSION: Unchanged right basilar infiltrate and/or atelectasis. Electronically Signed   By: Titus Dubin M.D.   On: 04/11/2018 10:37    Anti-infectives: Anti-infectives (From  admission, onward)   Start     Dose/Rate Route Frequency Ordered Stop   04/09/18 1800  piperacillin-tazobactam (ZOSYN) IVPB 3.375 g     3.375 g 12.5 mL/hr over 240 Minutes Intravenous Every 8 hours 04/09/18 1710     04/09/18 1145  piperacillin-tazobactam (ZOSYN) IVPB 3.375 g     3.375 g 100 mL/hr over 30 Minutes Intravenous  Once 04/09/18 1134 04/09/18 1301      Assessment/Plan: s/p * No surgery found * Continue IV abx  Consider allowing clears if we know he is not aspirating. May need swallow study No indication for urgent surgery right now  LOS: 3 days    Scott Rivera 04/12/2018

## 2018-04-12 NOTE — Progress Notes (Signed)
PMT progress note   Patient is awake alert, sitting up in bed. His sister, HCPOA agent [name] is at the bedside. The patient is coughing up violently, he is bringing up thick copious blood tinged secretions through his mouth and through his trach, his sister is suctioning it out. The patient gets restless at times, but for the most part, is able to be re directed and follows his sister's commands.   Patient to go to follow up CT abdomen later today.    Chart reviewed  BP 130/71   Pulse (!) 105   Temp 97.6 F (36.4 C) (Oral)   Resp 17   Ht 5\' 10"  (1.778 m)   Wt 73 kg   SpO2 95%   BMI 23.09 kg/m  Labs and imaging noted  Frail appearing gentleman He is awake Coarse breath sounds Has G tube No edema  PPS 30%  A/P: Pneumoperitoneum Possible aspiration Chronic foley Has PEG History of stroke  I discussed with sister [name] at the bedside this am. She continues to endorse full code, full scope. She has private duty sitters and home health at home, the patient lives with her at home in [place], [place]. She wishes to take the patient home with her towards the end of this hospitalization. 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   Continue current mode of care, no additional PMT specific recommendations at this time. Will follow peripherally.    25 minutes spent [facility] palliative medicine team [phone]

## 2018-04-12 NOTE — Progress Notes (Addendum)
Initial Nutrition Assessment  DOCUMENTATION CODES:   Not applicable  INTERVENTION:  Once medically appropriate:  Initiate Jevity 1.2 formula at 20 ml/hr via PEG increase by 10 ml every 8 hours to goal rate of 60 ml/hr (1440 total vol)      79mL Prostat once daily       At goal rate:  TF provides 1828 kcals and 95 g of protein, 1162 ml of free water  Patient at risk for refeeding; monitor Mg, K, PO4, daily. MD to replete as needed   NUTRITION DIAGNOSIS:   Inadequate oral intake related to inability to eat(PEG dependent) as evidenced by NPO status.  GOAL:   Patient will meet greater than or equal to 90% of their needs   MONITOR:   Diet advancement, Labs, Weight trends, I & O's  REASON FOR ASSESSMENT:   Malnutrition Screening Tool    ASSESSMENT:  67 year old patient with medical history significant of HTN, HLD, stroke s/p tracheostomy and PEG placement, nonverbal, CKD3, GERD, aspiration pneumonia, found by sister with black vomit everywhere admitted with pneumoperitoneum   Pt is bringing up thick blood tinged secretions through trach and awaiting CT scan of abdomen. Depending on results, will attempt feeding patient today.   Patient awake at RD visit and able to answer questions with head nods; no family present to obtain additional information. Patient does feed at home and when asked if he felt hungry he shook his head yes.    NUTRITION - FOCUSED PHYSICAL EXAM:    Most Recent Value  Orbital Region  Mild depletion  Upper Arm Region  Mild depletion  Thoracic and Lumbar Region  Mild depletion  Buccal Region  Mild depletion  Temple Region  Mild depletion  Clavicle Bone Region  Mild depletion  Clavicle and Acromion Bone Region  Mild depletion  Scapular Bone Region  Unable to assess  Dorsal Hand  Unable to assess  Patellar Region  Unable to assess  Anterior Thigh Region  Unable to assess  Posterior Calf Region  Unable to assess  Edema (RD Assessment)  None  Hair   Reviewed  Eyes  Reviewed  Mouth  Reviewed [poor dentition]  Skin  Reviewed Lurline Idol,  PEG]  Nails  Reviewed       Diet Order:   Diet Order            Diet NPO time specified  Diet effective now              EDUCATION NEEDS:   Not appropriate for education at this time  Skin:  Skin Assessment: Reviewed RN Assessment  Last BM:  04/11/2018: type 6  Height:   Ht Readings from Last 1 Encounters:  04/09/18 5\' 10"  (1.778 m)    Weight:   Wt Readings from Last 1 Encounters:  04/12/18 73 kg    Ideal Body Weight:  75.5 kg  BMI:  Body mass index is 23.09 kg/m.  Estimated Nutritional Needs:   Kcal:  1819-1971 (MSJ 1.2-1.3)  Protein:  95-117g (1.3-1.6g/kg)  Fluid:  </=1.9L/day    Lajuan Lines, RD, LDN  After Hours/Weekend Pager: [phone]

## 2018-04-12 NOTE — Progress Notes (Signed)
Patient ID: Scott Rivera, male   DOB: 05-Mar-1951, 67 y.o.   MRN: [id number]  PROGRESS NOTE    Scott Rivera  GYI:[id number] DOB: [birth year] DOA: 04/09/2018 PCP: Jamesetta Orleans, PA-C   Brief Narrative:  67 y.o. 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 CT of the abdomen showed free intraperitoneal air concerning for perforated viscus along with right lower lobe infiltrate.  Antibiotics were started.  General surgery was consulted who recommended admission to hospitalist service.   Assessment & Plan:   Principal Problem:   Pneumoperitoneum Active Problems:   Perforated viscus   Essential hypertension   History of cerebral infarction   Pressure injury of skin   Dysphagia, post-stroke   Tobacco abuse   Tachypnea   Diabetes mellitus (HCC)   Leukocytosis   CKD (chronic kidney disease) stage 2, GFR 60-89 ml/min   Goals of care, counseling/discussion   Bilateral chronic knee pain   Mixed hyperlipidemia   Anemia due to chronic kidney disease   Compensated metabolic alkalosis   History of tracheostomy   Acute respiratory failure with hypoxia (HCC)   Foley catheter in place on admission   Aspiration pneumonia of right lower lobe (HCC)   Nausea and vomiting   Malnutrition of moderate degree   Tracheostomy tube present ([place])   Gastrostomy tube dependent ([place])   Gastroesophageal reflux disease without esophagitis   Chronic respiratory insufficiency   Probable perforated viscus presenting with coffee-ground emesis/probable upper GI bleeding and intraperitoneal free air  -General  surgery following.  Continue Zosyn, IV fluids.  N.p.o.  Follow-up CT of the abdomen and pelvis for today.  -Protonix 40 mg IV every 12 hours for the concern of coffee-ground emesis/upper GI bleeding.  No episodes of coffee-ground emesis overnight as per nursing staff.  Hemoglobin has remained stable - Patient is a very high risk surgical candidate.  Overall prognosis is guarded to poor.    Palliative care following. -Patient remains full code at this time.  Right lower lobe bacterial pneumonia with a concern for aspiration pneumonia -Continue Zosyn.  Follow cultures.  Follow trach cultures.  Strep pneumoniae and Legionella urinary antigen.  Continue oxygen supplementation and wean off as able  Acute hypoxic respiratory failure in a patient with chronic tracheostomy -Currently still on oxygen via trach at 8 L/min.  -Give 1 more dose of Lasix today  Hypernatremia -No BMP available for today.  Continue current IV fluids.  Repeat a.m. labs  Leukocytosis -Probably secondary to above.    White count same compared to yesterday.  Repeat a.m. labs   history of stroke with chronic bedridden status along with chronic tracheostomy and gastrostomy with chronic indwelling Foley's catheter -Overall prognosis very poor.  Palliative care following.  Fall precautions -Patient's family has been resistant with palliative care conversations in the past.    DVT prophylaxis: SCDs.  Avoid Lovenox because of probable perforated viscus and coffee-ground emesis Code Status: Full Family Communication:  Spoke to sister on phone on 04/11/2018 Disposition Plan: Depends on clinical outcome Consultants: General surgery Procedures: None  Antimicrobials: Zosyn from 04/09/2018 onwards   Subjective: Patient seen and examined at bedside.  He is nonverbal but nods his  head to some questions.  No overnight coffee-ground emesis, hematemesis, hematochezia or melena reported.   Objective: Vitals:   04/12/18 0400  04/12/18 0435 04/12/18 0800 04/12/18 0840  BP: (!) 177/156 (!) 164/75    Pulse: (!) 104 99  97  Resp: 20 15  17   Temp:   98.5 F (36.9 C)   TempSrc:   Oral   SpO2: 90% 95%  94%  Weight:      Height:        Intake/Output Summary (Last 24 hours) at 04/12/2018 0926 Last data filed at 04/12/2018 0531 Gross per 24 hour  Intake 1006.86 ml  Output 2300 ml  Net -1293.14 ml   Filed Weights   04/09/18 1549 04/12/18 0200  Weight: 75.2 kg 73 kg    Examination:  General exam: Looks older than stated age.  Nonverbal.  No distress Neck: Trach in place  respiratory system: Bilateral decreased breath sounds at bases, with some scattered crackles.  No wheezing Cardiovascular system: S1-S2 heard, rate controlled Gastrointestinal system: Abdomen is slightly distended, soft and nontender.  Bowel sounds sluggish.  Gastrostomy tube in place  extremities: No cyanosis; edema  Central nervous system: Awake.  Nonverbal.  Nods his head to some questions.  Quadriplegic  Lymph: No cervical lymphadenopathy Genitourinary: Indwelling Foley catheter in place Psychiatry: Could not be assessed because of mental status    Data Reviewed: I have personally reviewed following labs and imaging studies  CBC: Recent Labs  Lab 04/09/18 0805 04/10/18 0802 04/11/18 0523 04/12/18 0847  WBC 14.2* 15.6* 12.8* 12.8*  NEUTROABS 11.3*  --  9.7* 9.0*  HGB 17.8* 15.4 14.1 15.3  HCT 58.9* 51.8 48.5 49.1  MCV 96.1 99.8 99.2 93.3  PLT 297 280 288 696   Basic Metabolic Panel: Recent Labs  Lab 04/09/18 0849 04/09/18 0905 04/10/18 0802 04/11/18 0523  NA 138 137 145 147*  K 5.2* 4.8 4.2 3.7  CL 100 98 107 111  CO2 22 29 27 24   GLUCOSE 79 97 78 78  BUN QUANTITY NOT SUFFICIENT, UNABLE TO PERFORM TEST 35* 25* 15  CREATININE QUANTITY NOT SUFFICIENT, UNABLE TO PERFORM TEST 1.14 1.13 0.91  CALCIUM 9.6 9.6 9.5 9.6  MG  --   --  2.1 2.0   GFR: Estimated Creatinine Clearance: 81.3 mL/min (by C-G formula based on  SCr of 0.91 mg/dL). Liver Function Tests: Recent Labs  Lab 04/09/18 0849 04/09/18 0905 04/10/18 0802 04/11/18 0523  AST 24 28 18 17   ALT QUANTITY NOT SUFFICIENT, UNABLE TO PERFORM TEST 16 14 14   ALKPHOS 96 97 79 71  BILITOT QUANTITY NOT SUFFICIENT, UNABLE TO PERFORM TEST 1.1 1.2 1.2  PROT QUANTITY NOT SUFFICIENT, UNABLE TO PERFORM TEST 8.2* 8.0 7.3  ALBUMIN QUANTITY NOT SUFFICIENT, UNABLE TO PERFORM TEST 3.2* 3.1* 3.1*   Recent Labs  Lab 04/09/18 0849  LIPASE 23   No results for input(s): AMMONIA in the last 168 hours. Coagulation Profile: No results for input(s): INR, PROTIME in the last 168 hours. Cardiac Enzymes: Recent Labs  Lab 04/09/18 0805  TROPONINI <0.03   BNP (last 3 results) No results for input(s): PROBNP in the last 8760 hours. HbA1C: No results for input(s): HGBA1C in the last 72 hours. CBG: Recent Labs  Lab 04/09/18 1319  GLUCAP 113*   Lipid Profile: No results for input(s): CHOL, HDL, LDLCALC, TRIG, CHOLHDL, LDLDIRECT in the last 72 hours. Thyroid Function Tests: No results for input(s): TSH, T4TOTAL, FREET4, T3FREE, THYROIDAB in the last 72 hours.  Anemia Panel: No results for input(s): VITAMINB12, FOLATE, FERRITIN, TIBC, IRON, RETICCTPCT in the last 72 hours. Sepsis Labs: No results for input(s): PROCALCITON, LATICACIDVEN in the last 168 hours.  Recent Results (from the past 240 hour(s))  MRSA PCR Screening     Status: None   Collection Time: 04/09/18  5:35 PM  Result Value Ref Range Status   MRSA by PCR NEGATIVE NEGATIVE Final    Comment:        The GeneXpert MRSA Assay (FDA approved for NASAL specimens only), is one component of a comprehensive MRSA colonization surveillance program. It is not intended to diagnose MRSA infection nor to guide or monitor treatment for MRSA infections. Performed at [facility], [place] [address], [place], [place] [address]   Culture, blood (routine x 2)     Status: None (Preliminary  result)   Collection Time: 04/09/18  6:30 PM  Result Value Ref Range Status   Specimen Description   Final    BLOOD LEFT HAND Performed at [facility] [address] [place], [place] [address]    Special Requests   Final    BOTTLES DRAWN AEROBIC ONLY Blood Culture results may not be optimal due to an inadequate volume of blood received in culture bottles Performed at [facility] [address] [place], [place] [address]    Culture   Final    NO GROWTH 2 DAYS Performed at [facility] [address], [place], [place] [address]    Report Status PENDING  Incomplete  Culture, blood (routine x 2)     Status: None (Preliminary result)   Collection Time: 04/09/18  6:38 PM  Result Value Ref Range Status   Specimen Description   Final    BLOOD RIGHT ANTECUBITAL Performed at [facility] [address], [place], [place] [address]    Special Requests   Final    BOTTLES DRAWN AEROBIC ONLY Blood Culture adequate volume Performed at [facility] [address], [place], [place] [address]    Culture  Setup Time   Final    GRAM POSITIVE COCCI AEROBIC BOTTLE ONLY CRITICAL RESULT CALLED TO, READ BACK BY AND VERIFIED WITH: D. WOFFORD, PHARMD ([facility]) AT 1050 ON 04/11/18 BY C. JESSUP, MLT.    Culture   Final    GRAM POSITIVE COCCI IDENTIFICATION TO FOLLOW Performed at [facility], [place] [address] [place], [place] [address]    Report Status PENDING  Incomplete  Blood Culture ID Panel (Reflexed)     Status: None   Collection Time: 04/09/18  6:38 PM  Result Value Ref Range Status   Enterococcus species NOT DETECTED NOT DETECTED Final   Listeria monocytogenes NOT DETECTED NOT DETECTED Final   Staphylococcus species NOT DETECTED NOT DETECTED Final   Staphylococcus aureus (BCID) NOT DETECTED NOT DETECTED Final   Streptococcus species NOT DETECTED NOT DETECTED Final   Streptococcus agalactiae NOT DETECTED  NOT DETECTED Final   Streptococcus pneumoniae NOT DETECTED NOT DETECTED Final   Streptococcus pyogenes NOT DETECTED NOT DETECTED Final   Acinetobacter baumannii NOT DETECTED NOT DETECTED Final   Enterobacteriaceae species NOT DETECTED NOT DETECTED Final   Enterobacter cloacae complex NOT DETECTED NOT DETECTED Final   Escherichia coli NOT DETECTED NOT DETECTED Final   Klebsiella oxytoca NOT DETECTED NOT DETECTED Final   Klebsiella pneumoniae NOT DETECTED NOT DETECTED Final   Proteus species NOT DETECTED NOT DETECTED Final   Serratia marcescens  NOT DETECTED NOT DETECTED Final   Haemophilus influenzae NOT DETECTED NOT DETECTED Final   Neisseria meningitidis NOT DETECTED NOT DETECTED Final   Pseudomonas aeruginosa NOT DETECTED NOT DETECTED Final   Candida albicans NOT DETECTED NOT DETECTED Final   Candida glabrata NOT DETECTED NOT DETECTED Final   Candida krusei NOT DETECTED NOT DETECTED Final   Candida parapsilosis NOT DETECTED NOT DETECTED Final   Candida tropicalis NOT DETECTED NOT DETECTED Final    Comment: Performed at  [facility], [facility] [facility] [address], [place], [place] [address]  Expectorated sputum assessment w rflx to resp cult     Status: None   Collection Time: 04/11/18  4:05 PM  Result Value Ref Range Status   Specimen Description TRACHEAL ASPIRATE  Final   Special Requests NONE  Final   Sputum evaluation   Final    THIS SPECIMEN IS ACCEPTABLE FOR SPUTUM CULTURE Performed at [facility], [place] [address], [place], [place] [address]    Report Status 04/11/2018 FINAL  Final  Culture, respiratory     Status: None (Preliminary result)   Collection Time: 04/11/18  4:06 PM  Result Value Ref Range Status   Specimen Description   Final    TRACHEAL ASPIRATE Performed at [facility] [address], [place], [place] [address]    Special Requests   Final    NONE Reflexed from [id number] Performed at [facility], [place] [address] [place], [place] [address]    Gram Stain   Final    FEW WBC PRESENT, PREDOMINANTLY PMN RARE GRAM POSITIVE COCCI RARE GRAM POSITIVE RODS RARE GRAM VARIABLE ROD    Culture   Final    TOO YOUNG TO READ Performed at [facility], [place] [address], [place], [place] [address]    Report Status PENDING  Incomplete         Radiology Studies: Dg Chest Port 1 View  Result Date: 04/11/2018 CLINICAL DATA:  Shortness of breath and cough. EXAM: PORTABLE CHEST 1 VIEW COMPARISON:  Chest x-ray from yesterday. FINDINGS: Unchanged tracheostomy tube. Stable cardiomediastinal silhouette. Normal pulmonary vascularity. Increased elevation of the right hemidiaphragm. Right basilar airspace disease is similar to prior study. The left lung is clear. No large pleural effusion. No pneumothorax. No acute osseous abnormality. IMPRESSION: Unchanged right basilar infiltrate and/or atelectasis. Electronically Signed   By: Titus Dubin M.D.   On: 04/11/2018 10:37        Scheduled Meds: . chlorhexidine  15 mL Mouth Rinse BID  . mouth rinse  15 mL Mouth Rinse q12n4p  . pantoprazole (PROTONIX) IV  40 mg Intravenous Q12H   Continuous Infusions: . sodium chloride Stopped (04/11/18 1829)  . dextrose 50 mL/hr at 04/12/18 0531  . piperacillin-tazobactam (ZOSYN)  IV 12.5 mL/hr at 04/12/18 0531     LOS: 3 days        Aline August, MD [facility] Pager [phone]  If 7PM-7AM, please contact night-coverage [email or web address] Password [facility] 04/12/2018, 9:26 AM

## 2018-04-13 ENCOUNTER — Inpatient Hospital Stay (HOSPITAL_COMMUNITY): Payer: Medicare HMO

## 2018-04-13 DIAGNOSIS — Z960 Presence of urogenital implants: Secondary | ICD-10-CM

## 2018-04-13 LAB — CULTURE, BLOOD (ROUTINE X 2): Special Requests: ADEQUATE

## 2018-04-13 LAB — CBC WITH DIFFERENTIAL/PLATELET
Abs Immature Granulocytes: 0.05 10*3/uL (ref 0.00–0.07)
Basophils Absolute: 0 10*3/uL (ref 0.0–0.1)
Basophils Relative: 0 %
Eosinophils Absolute: 0.3 10*3/uL (ref 0.0–0.5)
Eosinophils Relative: 3 %
HCT: 50.3 % (ref 39.0–52.0)
Hemoglobin: 15.4 g/dL (ref 13.0–17.0)
Immature Granulocytes: 1 %
Lymphocytes Relative: 15 %
Lymphs Abs: 1.4 10*3/uL (ref 0.7–4.0)
MCH: 29.3 pg (ref 26.0–34.0)
MCHC: 30.6 g/dL (ref 30.0–36.0)
MCV: 95.8 fL (ref 80.0–100.0)
Monocytes Absolute: 1.3 10*3/uL — ABNORMAL HIGH (ref 0.1–1.0)
Monocytes Relative: 14 %
Neutro Abs: 6.2 10*3/uL (ref 1.7–7.7)
Neutrophils Relative %: 67 %
Platelets: 327 10*3/uL (ref 150–400)
RBC: 5.25 MIL/uL (ref 4.22–5.81)
RDW: 14.1 % (ref 11.5–15.5)
WBC: 9.2 10*3/uL (ref 4.0–10.5)
nRBC: 0 % (ref 0.0–0.2)

## 2018-04-13 LAB — BASIC METABOLIC PANEL
Anion gap: 17 — ABNORMAL HIGH (ref 5–15)
BUN: 14 mg/dL (ref 8–23)
CO2: 24 mmol/L (ref 22–32)
Calcium: 9.6 mg/dL (ref 8.9–10.3)
Chloride: 101 mmol/L (ref 98–111)
Creatinine, Ser: 1.24 mg/dL (ref 0.61–1.24)
GFR calc Af Amer: >60 mL/min (ref >60)
GFR calc non Af Amer: 60 mL/min — ABNORMAL LOW (ref >60)
Glucose, Bld: 97 mg/dL (ref 70–99)
Potassium: 3.2 mmol/L — ABNORMAL LOW (ref 3.5–5.1)
Sodium: 142 mmol/L (ref 135–145)

## 2018-04-13 LAB — MAGNESIUM: Magnesium: 1.6 mg/dL — ABNORMAL LOW (ref 1.7–2.4)

## 2018-04-13 MED ORDER — FUROSEMIDE 10 MG/ML IJ SOLN
40.0000 mg | Freq: Two times a day (BID) | INTRAMUSCULAR | Status: DC
  Administered 2018-04-13 (×2): 40 mg via INTRAVENOUS
  Filled 2018-04-13 (×2): qty 4

## 2018-04-13 NOTE — Progress Notes (Signed)
Pt continues to refuse lab draws. Pt was educated on the importance of lab values for treatment. He continues to refuse. I will continue to educate and encourage lab draws.

## 2018-04-13 NOTE — Progress Notes (Signed)
Patient ID: Scott Rivera, male   DOB: October 13, 1950, 67 y.o.   MRN: [id number]  PROGRESS NOTE    VALDEZ BRANNAN  WNU:[id number] DOB: February 28, 1951 DOA: 04/09/2018 PCP: Jamesetta Orleans, PA-C   Brief Narrative:  67 y.o. 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 CT of the abdomen showed free intraperitoneal air concerning for perforated viscus along with right lower lobe infiltrate.  Antibiotics were started.  General surgery was consulted who recommended admission to hospitalist service.   Assessment & Plan:   Principal Problem:   Pneumoperitoneum Active Problems:   Perforated viscus   Essential hypertension   History of cerebral infarction   Pressure injury of skin   Dysphagia, post-stroke   Tobacco abuse   Tachypnea   Diabetes mellitus (HCC)   Leukocytosis   CKD (chronic kidney disease) stage 2, GFR 60-89 ml/min   Goals of care, counseling/discussion   Bilateral chronic knee pain   Mixed hyperlipidemia   Anemia due to chronic kidney disease   Compensated metabolic alkalosis   History of tracheostomy   Acute respiratory failure with hypoxia (HCC)   Foley catheter in place on admission   Aspiration pneumonia of right lower lobe (HCC)   Nausea and vomiting   Malnutrition of moderate degree   Tracheostomy tube present ([place])   Gastrostomy tube dependent ([place])   Gastroesophageal reflux disease without esophagitis   Chronic respiratory insufficiency   Probable perforated viscus presenting with coffee-ground emesis/probable upper GI bleeding and intraperitoneal free air  -General  surgery following.  Continue Zosyn.  Repeat CT done on 04/12/2018 showed decreasing volume of pneumoperitoneum.  General surgery is recommending to restart trickle tube feeds and monitor. -Protonix 40 mg IV every 12 hours for the concern of coffee-ground emesis/upper GI bleeding.  No episodes of coffee-ground emesis since admission.  Hemoglobin has remained stable - Patient is a very high risk surgical candidate.  Overall prognosis is guarded to poor.    Palliative care following. -Patient remains full code at this time.  Right lower lobe bacterial pneumonia with a concern for aspiration pneumonia -Continue Zosyn.  1 set of blood culture was positive for gram-positive cocci, aerobic bottle only.  Probably contaminant.  Will repeat cultures tomorrow.  Trach cultures are pending. Strep pneumoniae and Legionella urinary antigen negative.  Continue oxygen supplementation and wean off as able  Acute hypoxic respiratory failure in a patient with chronic tracheostomy -Currently still on oxygen via trach at 10 L/min.  -We will get repeat portable chest x-ray today. -Start scheduled Lasix 40 mg IV twice a day for now.  Hypernatremia -No labs available for today.  Repeat a.m. labs.  Continue current fluids  Leukocytosis -Probably secondary to above.    Follow repeat a.m. labs.  history of stroke with chronic bedridden status along with chronic tracheostomy and gastrostomy with chronic indwelling Foley's catheter -Overall prognosis very poor.  Fall precautions -Palliative care evaluation appreciated.  Patient will remain full code for now.  Sister [name] not interested in palliative care discussions at this time.    DVT prophylaxis: SCDs.  Avoid Lovenox because of probable perforated viscus and coffee-ground emesis Code Status: Full Family Communication:  None at bedside.  Spoke to sister on phone on 04/11/2018 Disposition Plan: Depends on clinical outcome Consultants: General  surgery Procedures: None  Antimicrobials: Zosyn from 04/09/2018 onwards   Subjective: Patient seen and examined at bedside.  He is sleeping, wakes up slightly, nonverbal.  No overnight fever or vomiting reported.    Objective: Vitals:   04/13/18 0421 04/13/18 0500 04/13/18 0750 04/13/18 0800  BP:    137/75  Pulse: (!) 103   (!) 103  Resp: 16   20  Temp:   97.7 F (36.5 C)   TempSrc:   Axillary   SpO2: 93%   91%  Weight:  73 kg    Height:        Intake/Output Summary (Last 24 hours) at 04/13/2018 1001 Last data filed at 04/13/2018 0755 Gross per 24 hour  Intake 973.55 ml  Output 1850 ml  Net -876.45 ml   Filed Weights   04/09/18 1549 04/12/18 0200 04/13/18 0500  Weight: 75.2 kg 73 kg 73 kg    Examination:  General exam: Looks older than stated age.  Nonverbal.  Sleepy, wakes up slightly.  No acute distress Neck: Tracheostomy in place respiratory system: Bilateral decreased breath sounds at bases, with some scattered crackles; mostly in the bases Cardiovascular system: Rate controlled, S1-S2 heard Gastrointestinal system: Abdomen is still slightly distended, soft and nontender.  Bowel sounds sluggish.  Gastrostomy tube in place  extremities: No cyanosis; edema  Central nervous system: Sleepy, wakes up slightly.  Nonverbal.  Quadriplegic  Lymph: No cervical lymphadenopathy Genitourinary: Indwelling Foley catheter in place Psychiatry: Could not be assessed because of mental status    Data Reviewed: I have personally reviewed following labs and imaging studies  CBC: Recent Labs  Lab 04/09/18 0805 04/10/18 0802 04/11/18 0523 04/12/18 0847  WBC 14.2* 15.6* 12.8* 12.8*  NEUTROABS 11.3*  --  9.7* 9.0*  HGB 17.8* 15.4 14.1 15.3  HCT 58.9* 51.8 48.5 49.1  MCV 96.1 99.8 99.2 93.3  PLT 297 280 288 388   Basic Metabolic Panel: Recent Labs  Lab 04/09/18 0849 04/09/18 0905 04/10/18 0802 04/11/18 0523 04/12/18 0847  NA 138 137 145 147* 144  K 5.2* 4.8 4.2 3.7  3.9  CL 100 98 107 111 106  CO2 22 29 27 24 27   GLUCOSE 79 97 78 78 95  BUN QUANTITY NOT SUFFICIENT, UNABLE TO PERFORM TEST 35* 25* 15 13  CREATININE QUANTITY NOT SUFFICIENT, UNABLE TO PERFORM TEST 1.14 1.13 0.91 0.97  CALCIUM 9.6 9.6 9.5 9.6 9.8  MG  --   --  2.1 2.0 1.6*   GFR: Estimated Creatinine Clearance: 76.3 mL/min (by C-G formula based on SCr of 0.97 mg/dL). Liver Function Tests: Recent Labs  Lab 04/09/18 0849 04/09/18 0905 04/10/18 0802 04/11/18 0523 04/12/18 0847  AST 24 28 18 17 22   ALT QUANTITY NOT SUFFICIENT, UNABLE TO PERFORM TEST 16 14 14 12   ALKPHOS 96 97 79 71 69  BILITOT QUANTITY NOT SUFFICIENT, UNABLE TO PERFORM TEST 1.1 1.2 1.2 1.5*  PROT QUANTITY NOT SUFFICIENT, UNABLE TO PERFORM TEST 8.2* 8.0 7.3 8.0  ALBUMIN QUANTITY NOT SUFFICIENT, UNABLE TO PERFORM TEST 3.2* 3.1* 3.1* 2.8*   Recent Labs  Lab 04/09/18 0849  LIPASE 23   No results for input(s): AMMONIA in the last 168 hours. Coagulation Profile: No results for input(s): INR, PROTIME in the last 168 hours. Cardiac Enzymes: Recent Labs  Lab 04/09/18 0805  TROPONINI <0.03   BNP (last 3 results) No results for input(s): PROBNP in  the last 8760 hours. HbA1C: No results for input(s): HGBA1C in the last 72 hours. CBG: Recent Labs  Lab 04/09/18 1319  GLUCAP 113*   Lipid Profile: No results for input(s): CHOL, HDL, LDLCALC, TRIG, CHOLHDL, LDLDIRECT in the last 72 hours. Thyroid Function Tests: No results for input(s): TSH, T4TOTAL, FREET4, T3FREE, THYROIDAB in the last 72 hours. Anemia Panel: No results for input(s): VITAMINB12, FOLATE, FERRITIN, TIBC, IRON, RETICCTPCT in the last 72 hours. Sepsis Labs: No results for input(s): PROCALCITON, LATICACIDVEN in the last 168 hours.  Recent Results (from the past 240 hour(s))  MRSA PCR Screening     Status: None   Collection Time: 04/09/18  5:35 PM  Result Value Ref Range Status   MRSA by PCR NEGATIVE NEGATIVE Final    Comment:        The  GeneXpert MRSA Assay (FDA approved for NASAL specimens only), is one component of a comprehensive MRSA colonization surveillance program. It is not intended to diagnose MRSA infection nor to guide or monitor treatment for MRSA infections. Performed at [facility], [place] [address] [place], [place] [address]   Culture, blood (routine x 2)     Status: None (Preliminary result)   Collection Time: 04/09/18  6:30 PM  Result Value Ref Range Status   Specimen Description   Final    BLOOD LEFT HAND Performed at [facility] [address], [place], [place] [address]    Special Requests   Final    BOTTLES DRAWN AEROBIC ONLY Blood Culture results may not be optimal due to an inadequate volume of blood received in culture bottles Performed at [facility] [address], [place], [place] [address]    Culture   Final    NO GROWTH 4 DAYS Performed at [facility], [place] [address] [place], [place] [address]    Report Status PENDING  Incomplete  Culture, blood (routine x 2)     Status: None (Preliminary result)   Collection Time: 04/09/18  6:38 PM  Result Value Ref Range Status   Specimen Description   Final    BLOOD RIGHT ANTECUBITAL Performed at [facility] [address] [place], [place] [address]    Special Requests   Final    BOTTLES DRAWN AEROBIC ONLY Blood Culture adequate volume Performed at [facility] [address] [place], [place] [address]    Culture  Setup Time   Final    GRAM POSITIVE COCCI AEROBIC BOTTLE ONLY CRITICAL RESULT CALLED TO, READ BACK BY AND VERIFIED WITH: D. WOFFORD, PHARMD ([facility]) AT 1050 ON 04/11/18 BY C. JESSUP, MLT.    Culture   Final    GRAM POSITIVE COCCI IDENTIFICATION TO FOLLOW Performed at [facility], [place] [address], [place],  [address]    Report Status PENDING  Incomplete  Blood Culture ID Panel (Reflexed)     Status: None    Collection Time: 04/09/18  6:38 PM  Result Value Ref Range Status   Enterococcus species NOT DETECTED NOT DETECTED Final   Listeria monocytogenes NOT DETECTED NOT DETECTED Final   Staphylococcus species NOT DETECTED NOT DETECTED Final   Staphylococcus aureus (BCID) NOT DETECTED NOT DETECTED Final   Streptococcus species NOT DETECTED NOT DETECTED Final   Streptococcus agalactiae NOT DETECTED NOT DETECTED Final   Streptococcus pneumoniae NOT DETECTED NOT DETECTED Final   Streptococcus pyogenes NOT DETECTED NOT DETECTED Final   Acinetobacter baumannii NOT DETECTED NOT  DETECTED Final   Enterobacteriaceae species NOT DETECTED NOT DETECTED Final   Enterobacter cloacae complex NOT DETECTED NOT DETECTED Final   Escherichia coli NOT DETECTED NOT DETECTED Final   Klebsiella oxytoca NOT DETECTED NOT DETECTED Final   Klebsiella pneumoniae NOT DETECTED NOT DETECTED Final   Proteus species NOT DETECTED NOT DETECTED Final   Serratia marcescens NOT DETECTED NOT DETECTED Final   Haemophilus influenzae NOT DETECTED NOT DETECTED Final   Neisseria meningitidis NOT DETECTED NOT DETECTED Final   Pseudomonas aeruginosa NOT DETECTED NOT DETECTED Final   Candida albicans NOT DETECTED NOT DETECTED Final   Candida glabrata NOT DETECTED NOT DETECTED Final   Candida krusei NOT DETECTED NOT DETECTED Final   Candida parapsilosis NOT DETECTED NOT DETECTED Final   Candida tropicalis NOT DETECTED NOT DETECTED Final    Comment: Performed at [facility], [place] [address] [place], [place] [address]  Expectorated sputum assessment w rflx to resp cult     Status: None   Collection Time: 04/11/18  4:05 PM  Result Value Ref Range Status   Specimen Description TRACHEAL ASPIRATE  Final   Special Requests NONE  Final   Sputum evaluation   Final    THIS SPECIMEN IS ACCEPTABLE FOR SPUTUM CULTURE Performed at [name], [place] [address] [place], [place] [address]    Report Status 04/11/2018 FINAL   Final  Culture, respiratory     Status: None (Preliminary result)   Collection Time: 04/11/18  4:06 PM  Result Value Ref Range Status   Specimen Description   Final    TRACHEAL ASPIRATE Performed at [facility] [address] [place], [place] [address]    Special Requests   Final    NONE Reflexed from [id number] Performed at [facility], [place] [address], [place], [place] [address]    Gram Stain   Final    FEW WBC PRESENT, PREDOMINANTLY PMN RARE GRAM POSITIVE COCCI RARE GRAM POSITIVE RODS RARE GRAM VARIABLE ROD    Culture   Final    TOO YOUNG TO READ Performed at [facility], [place] [address] [place], [place] [address]    Report Status PENDING  Incomplete         Radiology Studies: Ct Abdomen Pelvis W Contrast  Result Date: 04/12/2018 CLINICAL DATA:  67 y.o. 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 CT of the abdomen showed free intraperitoneal air concerning for perforated viscus along with right lower lobe infiltrate. Antibiotics were started. General surgery was consulted who recommended admission to hospitalist service. EXAM: CT ABDOMEN AND PELVIS WITH CONTRAST TECHNIQUE: Multidetector CT imaging of the abdomen and pelvis was performed using the standard protocol following bolus administration of intravenous contrast. CONTRAST:  1101mL OMNIPAQUE IOHEXOL 300 MG/ML  SOLN COMPARISON:  CT abdomen pelvis, 04/09/2018 and older studies. FINDINGS: Lower chest: Patchy peribronchovascular right lower lobe opacity is similar to the prior CT.  There is new opacity in the right middle lobe. This is most likely atelectasis. Hepatobiliary: Small hypoattenuating lesion in the anterior liver, segment 4B, unchanged. No new liver lesions. Normal gallbladder. No bile duct dilation. Pancreas: Unremarkable. No pancreatic ductal dilatation or  surrounding inflammatory changes. Spleen: Normal in size without focal abnormality. Adrenals/Urinary Tract: No adrenal masses. Absent left kidney, likely congenital. Right kidney normal in overall size and position. No mass, stone or hydronephrosis. Normal right ureter. Bladder mostly decompressed with a Foley catheter. Stomach/Bowel: Rectum is moderate to markedly distended with stool, but without wall thickening or adjacent inflammation. Colon is normal in caliber. No colonic wall thickening or adjacent inflammation. Small bowel is normal in caliber with no wall thickening or inflammation. Normal appendix visualized. Stomach is mostly decompressed. Gastrostomy tube pillars well position within the anterior aspect of the mid to distal stomach, unchanged. Vascular/Lymphatic: Aortic atherosclerotic changes extending into the iliac vessels. No adenopathy. Reproductive: Unremarkable. Other: There is still a small amount of free intraperitoneal air, although this has decreased since the prior CT. There is a small amount of air that lies along the mesenteric border of small-bowel loops in the left mid to lower abdomen. No ascites. Stable anterior abdominal wall sutures. Musculoskeletal: Advanced arthropathic changes of the hips with chronic avascular necrosis of the left femoral head. There are degenerative changes the visualized spine most evident at L4-L5. No fractures. No osteoblastic or osteolytic lesions IMPRESSION: 1. Free intraperitoneal air is decreased in amount from the prior CT. The source of this free air is unclear. There is no bowel wall thickening or inflammation to indicate a source of bowel perforation. Gastrostomy  tube appears well positioned. 2. There is persistent opacity in the right lower lobe with opacity now seen in the right middle lobe. Middle lobe opacity is likely atelectasis. Lower lobe opacity may reflect atelectasis, pneumonia or aspiration pneumonitis. 3. Rectum remains significantly distended with stool, but without rectal wall thickening or adjacent inflammation. 4. No new abnormalities. Electronically Signed   By: Lajean Manes M.D.   On: 04/12/2018 13:31   Dg Chest Port 1 View  Result Date: 04/11/2018 CLINICAL DATA:  Shortness of breath and cough. EXAM: PORTABLE CHEST 1 VIEW COMPARISON:  Chest x-ray from yesterday. FINDINGS: Unchanged tracheostomy tube. Stable cardiomediastinal silhouette. Normal pulmonary vascularity. Increased elevation of the right hemidiaphragm. Right basilar airspace disease is similar to prior study. The left lung is clear. No large pleural effusion. No pneumothorax. No acute osseous abnormality. IMPRESSION: Unchanged right basilar infiltrate and/or atelectasis. Electronically Signed   By: Titus Dubin M.D.   On: 04/11/2018 10:37        Scheduled Meds: . chlorhexidine  15 mL Mouth Rinse BID  . mouth rinse  15 mL Mouth Rinse q12n4p  . pantoprazole (PROTONIX) IV  40 mg Intravenous Q12H   Continuous Infusions: . sodium chloride Stopped (04/11/18 1829)  . dextrose 50 mL/hr at 04/13/18 0755  . piperacillin-tazobactam (ZOSYN)  IV 3.375 g (04/13/18 0930)     LOS: 4 days        Aline August, MD [facility] Pager [phone]  If 7PM-7AM, please contact night-coverage [email or web address] Password [email or web address] 04/13/2018, 10:01 AM

## 2018-04-13 NOTE — Progress Notes (Signed)
Subjective/Chief Complaint: Resting comfortably, watching tv. Denies any pain   Objective: Vital signs in last 24 hours: Temp:  [97.5 F (36.4 C)-98.5 F (36.9 C)] 97.5 F (36.4 C) (12/28 0400) Pulse Rate:  [76-112] 103 (12/28 0421) Resp:  [16-25] 16 (12/28 0421) BP: (130-202)/(48-114) 160/76 (12/28 0400) SpO2:  [91 %-99 %] 93 % (12/28 0421) FiO2 (%):  [35 %-40 %] 40 % (12/28 0421) Weight:  [73 kg] 73 kg (12/28 0500) Last BM Date: 04/09/18  Intake/Output from previous day: 12/27 0701 - 12/28 0700 In: 885.7 [I.V.:750.6; IV Piggyback:135.1] Out: 1850 [Urine:1850] Intake/Output this shift: No intake/output data recorded.  General appearance: alert and cooperative Resp: clear to auscultation bilaterally Cardio: regular rate and rhythm GI: soft, nontender, nondistended; feeding tube in place LUQ  Lab Results:  Recent Labs    04/11/18 0523 04/12/18 0847  WBC 12.8* 12.8*  HGB 14.1 15.3  HCT 48.5 49.1  PLT 288 327   BMET Recent Labs    04/11/18 0523 04/12/18 0847  NA 147* 144  K 3.7 3.9  CL 111 106  CO2 24 27  GLUCOSE 78 95  BUN 15 13  CREATININE 0.91 0.97  CALCIUM 9.6 9.8   PT/INR No results for input(s): LABPROT, INR in the last 72 hours. ABG No results for input(s): PHART, HCO3 in the last 72 hours.  Invalid input(s): PCO2, PO2  Studies/Results: Ct Abdomen Pelvis W Contrast  Result Date: 04/12/2018 CLINICAL DATA:  67 y.o. 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 CT of the abdomen showed free intraperitoneal air concerning for perforated viscus  along with right lower lobe infiltrate. Antibiotics were started. General surgery was consulted who recommended admission to hospitalist service. EXAM: CT ABDOMEN AND PELVIS WITH CONTRAST TECHNIQUE: Multidetector CT imaging of the abdomen and pelvis was performed using the standard protocol following bolus administration of intravenous contrast. CONTRAST:  179mL OMNIPAQUE IOHEXOL 300 MG/ML  SOLN COMPARISON:  CT abdomen pelvis, 04/09/2018 and older studies. FINDINGS: Lower chest: Patchy peribronchovascular right lower lobe opacity is similar to the prior CT. There is new opacity in the right middle lobe. This is most likely atelectasis. Hepatobiliary: Small hypoattenuating lesion in the anterior liver, segment 4B, unchanged. No new liver lesions. Normal gallbladder. No bile duct dilation. Pancreas: Unremarkable. No pancreatic ductal dilatation or surrounding inflammatory changes. Spleen: Normal in size without focal abnormality. Adrenals/Urinary Tract: No adrenal masses. Absent left kidney, likely congenital. Right kidney normal in overall size and position. No mass, stone or hydronephrosis. Normal right ureter. Bladder mostly decompressed with a Foley catheter. Stomach/Bowel: Rectum is moderate to markedly distended with stool, but without wall thickening or adjacent inflammation. Colon is normal in caliber. No colonic wall thickening or adjacent inflammation. Small bowel is normal in caliber with no wall thickening or inflammation. Normal appendix visualized. Stomach is mostly decompressed. Gastrostomy tube pillars well position within the anterior aspect of the mid to distal stomach, unchanged. Vascular/Lymphatic: Aortic atherosclerotic changes extending into the iliac vessels. No adenopathy. Reproductive: Unremarkable. Other: There is still a small amount of free intraperitoneal air, although this has decreased since the prior CT. There is a small amount of air that lies along the mesenteric border  of small-bowel  loops in the left mid to lower abdomen. No ascites. Stable anterior abdominal wall sutures. Musculoskeletal: Advanced arthropathic changes of the hips with chronic avascular necrosis of the left femoral head. There are degenerative changes the visualized spine most evident at L4-L5. No fractures. No osteoblastic or osteolytic lesions IMPRESSION: 1. Free intraperitoneal air is decreased in amount from the prior CT. The source of this free air is unclear. There is no bowel wall thickening or inflammation to indicate a source of bowel perforation. Gastrostomy tube appears well positioned. 2. There is persistent opacity in the right lower lobe with opacity now seen in the right middle lobe. Middle lobe opacity is likely atelectasis. Lower lobe opacity may reflect atelectasis, pneumonia or aspiration pneumonitis. 3. Rectum remains significantly distended with stool, but without rectal wall thickening or adjacent inflammation. 4. No new abnormalities. Electronically Signed   By: Lajean Manes M.D.   On: 04/12/2018 13:31   Dg Chest Port 1 View  Result Date: 04/11/2018 CLINICAL DATA:  Shortness of breath and cough. EXAM: PORTABLE CHEST 1 VIEW COMPARISON:  Chest x-ray from yesterday. FINDINGS: Unchanged tracheostomy tube. Stable cardiomediastinal silhouette. Normal pulmonary vascularity. Increased elevation of the right hemidiaphragm. Right basilar airspace disease is similar to prior study. The left lung is clear. No large pleural effusion. No pneumothorax. No acute osseous abnormality. IMPRESSION: Unchanged right basilar infiltrate and/or atelectasis. Electronically Signed   By: Titus Dubin M.D.   On: 04/11/2018 10:37    Anti-infectives: Anti-infectives (From admission, onward)   Start     Dose/Rate Route Frequency Ordered Stop   04/09/18 1800  piperacillin-tazobactam (ZOSYN) IVPB 3.375 g     3.375 g 12.5 mL/hr over 240 Minutes Intravenous Every 8 hours 04/09/18 1710     04/09/18 1145   piperacillin-tazobactam (ZOSYN) IVPB 3.375 g     3.375 g 100 mL/hr over 30 Minutes Intravenous  Once 04/09/18 1134 04/09/18 1301      Assessment/Plan: s/p * No surgery found * Continue IV abx  CT yesterday showed decreasing volume of pneumoperitoneum; no evidence of hollow viscus inflammation/perforation; feeding tube now snug. No free fluid. CT showed this to be well positioned; no extraluminal contrast noted - did exit stomach and opacify small bowel as well as colon. Ok to restart trickle tube feeds and monitor No indication for urgent surgery at this time  LOS: 4 days    Ileana Roup 04/13/2018

## 2018-04-14 LAB — CULTURE, RESPIRATORY W GRAM STAIN: Culture: NORMAL

## 2018-04-14 LAB — CBC WITH DIFFERENTIAL/PLATELET
Abs Immature Granulocytes: 0.03 10*3/uL (ref 0.00–0.07)
Basophils Absolute: 0 10*3/uL (ref 0.0–0.1)
Basophils Relative: 0 %
Eosinophils Absolute: 0.3 10*3/uL (ref 0.0–0.5)
Eosinophils Relative: 3 %
HCT: 52.1 % — ABNORMAL HIGH (ref 39.0–52.0)
Hemoglobin: 16.3 g/dL (ref 13.0–17.0)
Immature Granulocytes: 0 %
Lymphocytes Relative: 20 %
Lymphs Abs: 1.8 10*3/uL (ref 0.7–4.0)
MCH: 28.9 pg (ref 26.0–34.0)
MCHC: 31.3 g/dL (ref 30.0–36.0)
MCV: 92.4 fL (ref 80.0–100.0)
Monocytes Absolute: 1.4 10*3/uL — ABNORMAL HIGH (ref 0.1–1.0)
Monocytes Relative: 16 %
Neutro Abs: 5.5 10*3/uL (ref 1.7–7.7)
Neutrophils Relative %: 61 %
Platelets: 349 10*3/uL (ref 150–400)
RBC: 5.64 MIL/uL (ref 4.22–5.81)
RDW: 13.9 % (ref 11.5–15.5)
WBC: 9.1 10*3/uL (ref 4.0–10.5)
nRBC: 0 % (ref 0.0–0.2)

## 2018-04-14 LAB — BASIC METABOLIC PANEL
Anion gap: 16 — ABNORMAL HIGH (ref 5–15)
BUN: 19 mg/dL (ref 8–23)
CO2: 27 mmol/L (ref 22–32)
Calcium: 9.5 mg/dL (ref 8.9–10.3)
Chloride: 95 mmol/L — ABNORMAL LOW (ref 98–111)
Creatinine, Ser: 1.7 mg/dL — ABNORMAL HIGH (ref 0.61–1.24)
GFR calc Af Amer: 47 mL/min — ABNORMAL LOW (ref >60)
GFR calc non Af Amer: 41 mL/min — ABNORMAL LOW (ref >60)
Glucose, Bld: 85 mg/dL (ref 70–99)
Potassium: 3.7 mmol/L (ref 3.5–5.1)
Sodium: 138 mmol/L (ref 135–145)

## 2018-04-14 LAB — MAGNESIUM: Magnesium: 1.5 mg/dL — ABNORMAL LOW (ref 1.7–2.4)

## 2018-04-14 LAB — GLUCOSE, CAPILLARY
Glucose-Capillary: 91 mg/dL (ref 70–99)
Glucose-Capillary: 119 mg/dL — ABNORMAL HIGH (ref 70–99)

## 2018-04-14 LAB — CULTURE, BLOOD (ROUTINE X 2): Culture: NO GROWTH

## 2018-04-14 MED ORDER — PRO-STAT SUGAR FREE PO LIQD
30.0000 mL | Freq: Every day | ORAL | Status: DC
  Administered 2018-04-14 – 2018-04-16 (×3): 30 mL
  Filled 2018-04-14 (×3): qty 30

## 2018-04-14 MED ORDER — JEVITY 1.2 CAL PO LIQD
1000.0000 mL | ORAL | Status: DC
  Administered 2018-04-14 – 2018-04-16 (×3): 1000 mL

## 2018-04-14 NOTE — Progress Notes (Signed)
Right arm noted to be significantly more swollen than left. No prior notes about this. MD notified, will plan for [place] of right arm tomorrow to rule out clot.

## 2018-04-14 NOTE — Progress Notes (Signed)
Patient ID: Scott Rivera, male   DOB: [birth year], 67 y.o.   MRN: [id number]  PROGRESS NOTE    Scott Rivera  BPZ:[id number] DOB: 1950-06-01 DOA: 04/09/2018 PCP: Jamesetta Orleans, PA-C   Brief Narrative:  67 y.o. 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 CT of the abdomen showed free intraperitoneal air concerning for perforated viscus along with right lower lobe infiltrate.  Antibiotics were started.  General surgery was consulted who recommended admission to hospitalist service.   Assessment & Plan:   Principal Problem:   Pneumoperitoneum Active Problems:   Perforated viscus   Essential hypertension   History of cerebral infarction   Pressure injury of skin   Dysphagia, post-stroke   Tobacco abuse   Tachypnea   Diabetes mellitus (HCC)   Leukocytosis   CKD (chronic kidney disease) stage 2, GFR 60-89 ml/min   Goals of care, counseling/discussion   Bilateral chronic knee pain   Mixed hyperlipidemia   Anemia due to chronic kidney disease   Compensated metabolic alkalosis   History of tracheostomy   Acute respiratory failure with hypoxia (HCC)   Foley catheter in place on admission   Aspiration pneumonia of right lower lobe (HCC)   Nausea and vomiting   Malnutrition of moderate degree   Tracheostomy tube present ([place])   Gastrostomy tube dependent ([place])   Gastroesophageal reflux disease without esophagitis   Chronic respiratory insufficiency   Probable perforated viscus presenting with coffee-ground emesis/probable upper GI bleeding and intraperitoneal free air  -General  surgery following.  Continue Zosyn.  Repeat CT done on 04/12/2018 showed decreasing volume of pneumoperitoneum.  General surgery is of the opinion that this might be incidental free air from PEG site or possibly pulmonary source.  No indication for surgery for abdominal sepsis.  Surgery thinks that patient would not need a long course of antibiotics.  Okay to resume tube feedings.  General surgery has signed off. -We will resume tube feeds. -Protonix 40 mg IV every 12 hours for the concern of coffee-ground emesis/upper GI bleeding.  No episodes of coffee-ground emesis since admission.  Hemoglobin has remained stable.  Might need outpatient GI evaluation - Patient is a very high risk surgical candidate.  Overall prognosis is guarded to poor.    Palliative care following. -Patient remains full code at this time.  Right lower lobe bacterial pneumonia with a concern for aspiration pneumonia -Continue Zosyn.  Finish 7-day course of antibiotic therapy.  1 set of blood culture was positive for gram-positive cocci, aerobic bottle only.  Probably contaminant.  Will follow repeat cultures for today.  Trach cultures are pending. Strep pneumoniae and Legionella urinary antigen negative.  Continue oxygen supplementation and wean off as able  Acute hypoxic respiratory failure in a patient with chronic tracheostomy -Currently still on oxygen via trach at 10 L/min.  -Patient received few doses of Lasix over the last few days.  Chest x-ray from 04/13/2018 shows improvement in right-sided aeration.  Will not give any more Lasix for now.  Hypernatremia -Treated with IV fluids.  Resolved  Leukocytosis -Probably secondary to above.    Resolved.  history of stroke with chronic bedridden status along with chronic tracheostomy and  gastrostomy with chronic indwelling Foley's catheter -Overall prognosis very poor.  Fall precautions -Palliative care evaluation appreciated.  Patient will remain full code for now.  Sister  [name] not interested in palliative care discussions at this time.    DVT prophylaxis: SCDs.  Avoid Lovenox because of probable perforated viscus and coffee-ground emesis Code Status: Full Family Communication:  None at bedside.  Spoke to sister on phone on 04/11/2018 Disposition Plan:  Home in the next 2 to 3 days if respiratory status improves and if he is tolerating tube feeds Consultants: General surgery Procedures: None  Antimicrobials: Zosyn from 04/09/2018 onwards   Subjective: Patient seen and examined at bedside.  He is sleeping, wakes up very slightly.  Nonverbal.  No overnight fever or vomiting reported. Objective: Vitals:   04/14/18 0415 04/14/18 0500 04/14/18 0600 04/14/18 0800  BP:   115/90   Pulse:   (!) 103   Resp:   19   Temp: (!) 97.5 F (36.4 C)   98.1 F (36.7 C)  TempSrc: Axillary   Oral  SpO2:   93%   Weight:  73.6 kg    Height:        Intake/Output Summary (Last 24 hours) at 04/14/2018 1005 Last data filed at 04/14/2018 0981 Gross per 24 hour  Intake 1410 ml  Output 350 ml  Net 1060 ml   Filed Weights   04/12/18 0200 04/13/18 0500 04/14/18 0500  Weight: 73 kg 73 kg 73.6 kg    Examination:  General exam: Looks older than stated age.  Nonverbal.  Sleepy, wakes up slightly.  No distress  neck: Tracheostomy in place respiratory system: Bilateral decreased breath sounds at bases, with some scattered crackles; mostly in the bases.  No wheezing Cardiovascular system: Intermittent tachycardia, S1-S2 heard Gastrointestinal system: Abdomen is still slightly distended, soft and nontender.  Bowel sounds sluggish.  Gastrostomy tube in place  extremities: No cyanosis; edema    Data Reviewed: I have personally reviewed following labs and imaging studies  CBC: Recent Labs  Lab 04/09/18 0805 04/10/18 0802 04/11/18 0523 04/12/18 0847 04/13/18 1123 04/14/18 0355  WBC 14.2* 15.6* 12.8* 12.8* 9.2 9.1  NEUTROABS 11.3*  --  9.7* 9.0* 6.2 5.5  HGB  17.8* 15.4 14.1 15.3 15.4 16.3  HCT 58.9* 51.8 48.5 49.1 50.3 52.1*  MCV 96.1 99.8 99.2 93.3 95.8 92.4  PLT 297 280 288 327 327 191   Basic Metabolic Panel: Recent Labs  Lab 04/10/18 0802 04/11/18 0523 04/12/18 0847 04/13/18 1123 04/14/18 0355  NA 145 147* 144 142 138  K 4.2 3.7 3.9 3.2* 3.7  CL 107 111 106 101 95*  CO2 27 24 27 24 27   GLUCOSE 78 78 95 97 85  BUN 25* 15 13 14 19   CREATININE 1.13 0.91 0.97 1.24 1.70*  CALCIUM 9.5 9.6 9.8 9.6 9.5  MG 2.1 2.0 1.6* 1.6* 1.5*   GFR: Estimated Creatinine Clearance: 43.5 mL/min (A) (by C-G formula based on SCr of 1.7 mg/dL (H)). Liver Function Tests: Recent Labs  Lab 04/09/18 0849 04/09/18 0905 04/10/18 0802 04/11/18 0523 04/12/18 0847  AST 24 28 18 17 22   ALT QUANTITY NOT SUFFICIENT, UNABLE TO PERFORM TEST 16 14 14 12   ALKPHOS 96 97 79 71 69  BILITOT QUANTITY NOT SUFFICIENT, UNABLE TO PERFORM TEST 1.1 1.2 1.2 1.5*  PROT QUANTITY NOT SUFFICIENT, UNABLE TO PERFORM TEST 8.2* 8.0 7.3 8.0  ALBUMIN QUANTITY NOT SUFFICIENT, UNABLE TO PERFORM TEST 3.2* 3.1* 3.1* 2.8*   Recent Labs  Lab 04/09/18 0849  LIPASE 23   No results for input(s): AMMONIA in the last 168 hours. Coagulation Profile: No results for input(s): INR, PROTIME in the last 168 hours. Cardiac Enzymes: Recent Labs  Lab 04/09/18 0805  TROPONINI <0.03   BNP (last 3 results) No results for input(s): PROBNP in the last 8760 hours. HbA1C: No results for input(s): HGBA1C in the last 72 hours. CBG: Recent Labs  Lab 04/09/18 1319  GLUCAP 113*   Lipid Profile: No results for input(s): CHOL, HDL, LDLCALC, TRIG, CHOLHDL, LDLDIRECT in the last 72 hours. Thyroid Function Tests: No results for input(s): TSH, T4TOTAL, FREET4, T3FREE, THYROIDAB in the last 72 hours. Anemia Panel: No results for input(s): VITAMINB12, FOLATE, FERRITIN, TIBC, IRON, RETICCTPCT in the last 72 hours. Sepsis Labs: No results for input(s): PROCALCITON, LATICACIDVEN in the last 168  hours.  Recent Results (from the past 240 hour(s))  MRSA PCR Screening     Status: None   Collection Time: 04/09/18  5:35 PM  Result Value Ref Range Status   MRSA by PCR NEGATIVE NEGATIVE Final    Comment:        The GeneXpert MRSA Assay (FDA approved for NASAL specimens only), is one component of a comprehensive MRSA colonization surveillance program. It is not intended to diagnose MRSA infection nor to guide or monitor treatment for MRSA infections. Performed at [facility], [place] [address], [place], [place] [address]   Culture, blood (routine x 2)     Status: None (Preliminary result)   Collection Time: 04/09/18  6:30 PM  Result Value Ref Range Status   Specimen Description   Final    BLOOD LEFT HAND Performed at [facility] [address], [place], [place] [address]    Special Requests   Final    BOTTLES DRAWN AEROBIC ONLY Blood Culture results may not be optimal due to an inadequate volume of blood received in culture bottles Performed at [facility] [address] [place], [place] [address]    Culture   Final    NO GROWTH 4 DAYS Performed at [facility], [place] [address], [place], [place] [address]    Report Status PENDING  Incomplete  Culture, blood (routine x 2)     Status: Abnormal   Collection Time: 04/09/18  6:38 PM  Result Value Ref Range Status   Specimen Description   Final    BLOOD RIGHT ANTECUBITAL Performed at [facility] [address], [place], [place] [address]    Special Requests   Final    BOTTLES DRAWN AEROBIC ONLY Blood Culture adequate volume Performed at [facility] [address], [place], [place] [address]    Culture  Setup Time   Final    GRAM POSITIVE COCCI AEROBIC BOTTLE ONLY CRITICAL RESULT CALLED TO, READ BACK BY AND VERIFIED WITH: D. WOFFORD, PHARMD ([facility]) AT 1050 ON 04/11/18 BY C. JESSUP, MLT.    Culture (A)  Final     STAPHYLOCOCCUS SPECIES (COAGULASE NEGATIVE) THE SIGNIFICANCE OF ISOLATING THIS ORGANISM FROM A SINGLE SET OF BLOOD CULTURES WHEN MULTIPLE SETS ARE DRAWN IS UNCERTAIN. PLEASE NOTIFY THE MICROBIOLOGY DEPARTMENT WITHIN ONE WEEK IF SPECIATION AND SENSITIVITIES ARE REQUIRED. Performed at [facility], [place] [address] [place],  [address]    Report Status 04/13/2018 FINAL  Final  Blood Culture ID Panel (Reflexed)     Status: None   Collection Time: 04/09/18  6:38 PM  Result Value Ref Range Status   Enterococcus species NOT DETECTED NOT DETECTED Final   Listeria monocytogenes NOT DETECTED NOT DETECTED Final   Staphylococcus species NOT DETECTED NOT DETECTED Final   Staphylococcus aureus (BCID) NOT DETECTED NOT DETECTED Final   Streptococcus species NOT DETECTED NOT DETECTED Final   Streptococcus agalactiae NOT DETECTED NOT DETECTED Final   Streptococcus pneumoniae NOT DETECTED NOT DETECTED Final   Streptococcus pyogenes NOT DETECTED NOT DETECTED Final   Acinetobacter baumannii NOT DETECTED NOT DETECTED Final   Enterobacteriaceae species NOT DETECTED NOT DETECTED Final   Enterobacter cloacae complex NOT DETECTED NOT DETECTED Final   Escherichia coli NOT DETECTED NOT DETECTED Final   Klebsiella oxytoca NOT DETECTED NOT DETECTED Final   Klebsiella pneumoniae NOT DETECTED NOT DETECTED Final   Proteus species NOT DETECTED NOT DETECTED Final   Serratia marcescens NOT DETECTED NOT DETECTED Final   Haemophilus influenzae NOT DETECTED NOT DETECTED Final   Neisseria meningitidis NOT DETECTED NOT DETECTED Final   Pseudomonas aeruginosa NOT DETECTED NOT DETECTED Final   Candida albicans NOT DETECTED NOT DETECTED Final   Candida glabrata NOT DETECTED NOT DETECTED Final   Candida krusei NOT DETECTED NOT DETECTED Final   Candida parapsilosis NOT DETECTED NOT DETECTED Final   Candida tropicalis NOT DETECTED NOT DETECTED Final    Comment: Performed at [facility], [address],  [place], [place] [address]  Expectorated sputum assessment w rflx to resp cult     Status: None   Collection Time: 04/11/18  4:05 PM  Result Value Ref Range Status   Specimen Description TRACHEAL ASPIRATE  Final   Special Requests NONE  Final   Sputum evaluation   Final    THIS SPECIMEN IS ACCEPTABLE FOR SPUTUM CULTURE Performed at [facility], [place] [address], [place], [place] [address]    Report Status 04/11/2018 FINAL  Final  Culture, respiratory     Status: None   Collection Time: 04/11/18  4:06 PM  Result Value Ref Range Status   Specimen Description   Final    TRACHEAL ASPIRATE Performed at [facility] [address] [place], [place] [address]    Special Requests   Final    NONE Reflexed from [id number] Performed at [facility], [place] [address], [place], [place] [address]    Gram Stain   Final    FEW WBC PRESENT, PREDOMINANTLY PMN RARE GRAM POSITIVE COCCI RARE GRAM POSITIVE RODS RARE GRAM VARIABLE ROD    Culture   Final    FEW Consistent with normal respiratory flora. Performed at [facility], [place] [address] [place], [place] [address]    Report Status 04/14/2018 FINAL  Final         Radiology Studies: Ct Abdomen Pelvis W Contrast  Result Date: 04/12/2018 CLINICAL DATA:  67 y.o. 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 CT of the abdomen showed free intraperitoneal air concerning for perforated viscus along with right lower lobe infiltrate. Antibiotics were started. General surgery was consulted who  recommended admission to hospitalist service. EXAM: CT ABDOMEN AND PELVIS WITH CONTRAST TECHNIQUE: Multidetector CT imaging of the abdomen and pelvis was  performed using the standard protocol following bolus administration of intravenous contrast. CONTRAST:  150mL OMNIPAQUE IOHEXOL 300 MG/ML  SOLN COMPARISON:  CT abdomen pelvis, 04/09/2018 and older studies. FINDINGS: Lower chest: Patchy peribronchovascular right lower lobe opacity is similar to the prior CT. There is new opacity in the right middle lobe. This is most likely atelectasis. Hepatobiliary: Small hypoattenuating lesion in the anterior liver, segment 4B, unchanged. No new liver lesions. Normal gallbladder. No bile duct dilation. Pancreas: Unremarkable. No pancreatic ductal dilatation or surrounding inflammatory changes. Spleen: Normal in size without focal abnormality. Adrenals/Urinary Tract: No adrenal masses. Absent left kidney, likely congenital. Right kidney normal in overall size and position. No mass, stone or hydronephrosis. Normal right ureter. Bladder mostly decompressed with a Foley catheter. Stomach/Bowel: Rectum is moderate to markedly distended with stool, but without wall thickening or adjacent inflammation. Colon is normal in caliber. No colonic wall thickening or adjacent inflammation. Small bowel is normal in caliber with no wall thickening or inflammation. Normal appendix visualized. Stomach is mostly decompressed. Gastrostomy tube pillars well position within the anterior aspect of the mid to distal stomach, unchanged. Vascular/Lymphatic: Aortic atherosclerotic changes extending into the iliac vessels. No adenopathy. Reproductive: Unremarkable. Other: There is still a small amount of free intraperitoneal air, although this has decreased since the prior CT. There is a small amount of air that lies along the mesenteric border of small-bowel loops in the left mid to lower abdomen. No ascites. Stable anterior abdominal wall sutures.  Musculoskeletal: Advanced arthropathic changes of the hips with chronic avascular necrosis of the left femoral head. There are degenerative changes the visualized spine most evident at L4-L5. No fractures. No osteoblastic or osteolytic lesions IMPRESSION: 1. Free intraperitoneal air is decreased in amount from the prior CT. The source of this free air is unclear. There is no bowel wall thickening or inflammation to indicate a source of bowel perforation. Gastrostomy tube appears well positioned. 2. There is persistent opacity in the right lower lobe with opacity now seen in the right middle lobe. Middle lobe opacity is likely atelectasis. Lower lobe opacity may reflect atelectasis, pneumonia or aspiration pneumonitis. 3. Rectum remains significantly distended with stool, but without rectal wall thickening or adjacent inflammation. 4. No new abnormalities. Electronically Signed   By: Lajean Manes M.D.   On: 04/12/2018 13:31   Dg Chest Port 1 View  Result Date: 04/13/2018 CLINICAL DATA:  dyspnea EXAM: PORTABLE CHEST 1 VIEW COMPARISON:  Is 04/11/2018 FINDINGS: Tracheostomy tube is in place, tip unchanged within the mid trachea. The heart size is mildly enlarged. Patchy opacity within the RIGHT LOWER lobe has improved. There is minimal persistent MEDIAL opacity and RIGHT perihilar opacity. LEFT lung is clear. IMPRESSION: Improved aeration in the RIGHT LOWER lobe. Electronically Signed   By: Nolon Nations M.D.   On: 04/13/2018 12:15        Scheduled Meds: . chlorhexidine  15 mL Mouth Rinse BID  . mouth rinse  15 mL Mouth Rinse q12n4p  . pantoprazole (PROTONIX) IV  40 mg Intravenous Q12H   Continuous Infusions: . sodium chloride Stopped (04/11/18 1829)  . dextrose Stopped (04/14/18 8341)  . feeding supplement (JEVITY 1.2 CAL)    . piperacillin-tazobactam (ZOSYN)  IV 12.5 mL/hr at 04/14/18 0929     LOS: 5 days        Aline August, MD [facility] Pager [phone]  If 7PM-7AM,  please contact night-coverage [email or web address] Password [email or web address] 04/14/2018, 10:05 AM

## 2018-04-14 NOTE — Progress Notes (Signed)
Pt pulled out IV in right arm causing skin tear.pink foams placed over site. IV and fluids restarted. Will continue to monitor patient closely at this time

## 2018-04-14 NOTE — Progress Notes (Signed)
Patient ID: Scott Rivera, male   DOB: Sep 29, 1950, 67 y.o.   MRN: [id number]     Subjective: Patient is nonverbal.  Alert and appears comfortable.  Nurses report no apparent pain or problems overnight.  Objective: Vital signs in last 24 hours: Temp:  [97.3 F (36.3 C)-98.6 F (37 C)] 98.1 F (36.7 C) (12/29 0800) Pulse Rate:  [97-118] 103 (12/29 0600) Resp:  [14-24] 19 (12/29 0600) BP: (115-157)/(74-97) 115/90 (12/29 0600) SpO2:  [92 %-96 %] 93 % (12/29 0600) FiO2 (%):  [40 %] 40 % (12/29 0351) Weight:  [73.6 kg] 73.6 kg (12/29 0500) Last BM Date: 04/11/18  Intake/Output from previous day: 12/28 0701 - 12/29 0700 In: 1426.4 [I.V.:1252.1; IV Piggyback:174.3] Out: 350 [Urine:350] Intake/Output this shift: No intake/output data recorded.  General appearance: alert and Chronically ill-appearing, nonverbal GI: Nondistended.  When patient relaxes after beginning of exam no apparent tenderness.  G-tube site is unremarkable.  Lab Results:  Recent Labs    04/13/18 1123 04/14/18 0355  WBC 9.2 9.1  HGB 15.4 16.3  HCT 50.3 52.1*  PLT 327 349   BMET Recent Labs    04/13/18 1123 04/14/18 0355  NA 142 138  K 3.2* 3.7  CL 101 95*  CO2 24 27  GLUCOSE 97 85  BUN 14 19  CREATININE 1.24 1.70*  CALCIUM 9.6 9.5     Studies/Results: Ct Abdomen Pelvis W Contrast  Result Date: 04/12/2018 CLINICAL DATA:  67 y.o. 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 CT of the abdomen showed free intraperitoneal air concerning for perforated viscus along with right lower lobe infiltrate.  Antibiotics were started. General surgery was consulted who recommended admission to hospitalist service. EXAM: CT ABDOMEN AND PELVIS WITH CONTRAST TECHNIQUE: Multidetector CT imaging of the abdomen and pelvis was performed using the standard protocol following bolus administration of intravenous contrast. CONTRAST:  121mL OMNIPAQUE IOHEXOL 300 MG/ML  SOLN COMPARISON:  CT abdomen pelvis, 04/09/2018 and older studies. FINDINGS: Lower chest: Patchy peribronchovascular right lower lobe opacity is similar to the prior CT. There is new opacity in the right middle lobe. This is most likely atelectasis. Hepatobiliary: Small hypoattenuating lesion in the anterior liver, segment 4B, unchanged. No new liver lesions. Normal gallbladder. No bile duct dilation. Pancreas: Unremarkable. No pancreatic ductal dilatation or surrounding inflammatory changes. Spleen: Normal in size without focal abnormality. Adrenals/Urinary Tract: No adrenal masses. Absent left kidney, likely congenital. Right kidney normal in overall size and position. No mass, stone or hydronephrosis. Normal right ureter. Bladder mostly decompressed with a Foley catheter. Stomach/Bowel: Rectum is moderate to markedly distended with stool, but without wall thickening or adjacent inflammation. Colon is normal in caliber. No colonic wall thickening or adjacent inflammation. Small bowel is normal in caliber with no wall thickening or inflammation. Normal appendix visualized. Stomach is mostly decompressed. Gastrostomy tube pillars well position within the anterior aspect of the mid to distal stomach, unchanged. Vascular/Lymphatic: Aortic atherosclerotic changes extending into the iliac vessels. No adenopathy. Reproductive: Unremarkable. Other: There is still a small amount of free intraperitoneal air, although this has decreased since the prior CT. There is a small amount of air that lies along the mesenteric border of  small-bowel loops in the left mid to lower abdomen.  No ascites. Stable anterior abdominal wall sutures. Musculoskeletal: Advanced arthropathic changes of the hips with chronic avascular necrosis of the left femoral head. There are degenerative changes the visualized spine most evident at L4-L5. No fractures. No osteoblastic or osteolytic lesions IMPRESSION: 1. Free intraperitoneal air is decreased in amount from the prior CT. The source of this free air is unclear. There is no bowel wall thickening or inflammation to indicate a source of bowel perforation. Gastrostomy tube appears well positioned. 2. There is persistent opacity in the right lower lobe with opacity now seen in the right middle lobe. Middle lobe opacity is likely atelectasis. Lower lobe opacity may reflect atelectasis, pneumonia or aspiration pneumonitis. 3. Rectum remains significantly distended with stool, but without rectal wall thickening or adjacent inflammation. 4. No new abnormalities. Electronically Signed   By: Lajean Manes M.D.   On: 04/12/2018 13:31   Dg Chest Port 1 View  Result Date: 04/13/2018 CLINICAL DATA:  dyspnea EXAM: PORTABLE CHEST 1 VIEW COMPARISON:  Is 04/11/2018 FINDINGS: Tracheostomy tube is in place, tip unchanged within the mid trachea. The heart size is mildly enlarged. Patchy opacity within the RIGHT LOWER lobe has improved. There is minimal persistent MEDIAL opacity and RIGHT perihilar opacity. LEFT lung is clear. IMPRESSION: Improved aeration in the RIGHT LOWER lobe. Electronically Signed   By: Nolon Nations M.D.   On: 04/13/2018 12:15    Anti-infectives: Anti-infectives (From admission, onward)   Start     Dose/Rate Route Frequency Ordered Stop   04/09/18 1800  piperacillin-tazobactam (ZOSYN) IVPB 3.375 g     3.375 g 12.5 mL/hr over 240 Minutes Intravenous Every 8 hours 04/09/18 1710     04/09/18 1145  piperacillin-tazobactam (ZOSYN) IVPB 3.375 g     3.375 g 100 mL/hr over 30 Minutes Intravenous  Once 04/09/18 1134 04/09/18 1301       Assessment/Plan: Patient with history of stroke, PEG tube and tube feeding, nonverbal.  Admitted with vomiting.  Free air on CT scan.  Abdomen has remained benign and no apparent source on CT.  Currently no evidence of infection i.e. fever or leukocytosis.  Suspect this was incidental free air from PEG site or possibly pulmonary source.  No indication for surgery for abdominal sepsis.  Okay to resume tube feedings.  I would not think he would need a long course of antibiotics but will leave this up to the hospitalist.  We will sign off.  Please call if needed.    LOS: 5 days    Edward Jolly 04/14/2018

## 2018-04-14 NOTE — Progress Notes (Signed)
Nutrition Note  Consult received for enteral/tube feeding initiation and management.  Initial assessment completed 12/27. TF recommendations were provided at that time. Consult received via MD and Surgery to start TF today at slow rate. Will initiate Jevity 1.2 @ 20 ml/hr and advance by 10 ml every 12 hours to goal rate of 60 ml/hr with 30 ml Prostat daily.   Admitting Dx: VOMITING  Body mass index is 23.28 kg/m. Pt meets criteria for normal based on current BMI.  Labs:  Recent Labs  Lab 04/12/18 0847 04/13/18 1123 04/14/18 0355  NA 144 142 138  K 3.9 3.2* 3.7  CL 106 101 95*  CO2 27 24 27   BUN 13 14 19   CREATININE 0.97 1.24 1.70*  CALCIUM 9.8 9.6 9.5  MG 1.6* 1.6* 1.5*  GLUCOSE 95 97 85    Clayton Bibles, MS, RD, LDN [place] [facility] Pager: [phone] After Hours Pager: [phone]

## 2018-04-15 ENCOUNTER — Inpatient Hospital Stay (HOSPITAL_COMMUNITY): Payer: Medicare HMO

## 2018-04-15 ENCOUNTER — Inpatient Hospital Stay (HOSPITAL_BASED_OUTPATIENT_CLINIC_OR_DEPARTMENT_OTHER): Payer: Medicare HMO

## 2018-04-15 DIAGNOSIS — R609 Edema, unspecified: Secondary | ICD-10-CM

## 2018-04-15 DIAGNOSIS — J9601 Acute respiratory failure with hypoxia: Secondary | ICD-10-CM

## 2018-04-15 DIAGNOSIS — Z930 Tracheostomy status: Secondary | ICD-10-CM

## 2018-04-15 DIAGNOSIS — J690 Pneumonitis due to inhalation of food and vomit: Secondary | ICD-10-CM

## 2018-04-15 DIAGNOSIS — I69391 Dysphagia following cerebral infarction: Secondary | ICD-10-CM

## 2018-04-15 DIAGNOSIS — R0689 Other abnormalities of breathing: Secondary | ICD-10-CM

## 2018-04-15 DIAGNOSIS — Z8673 Personal history of transient ischemic attack (TIA), and cerebral infarction without residual deficits: Secondary | ICD-10-CM

## 2018-04-15 DIAGNOSIS — Z9889 Other specified postprocedural states: Secondary | ICD-10-CM

## 2018-04-15 DIAGNOSIS — K668 Other specified disorders of peritoneum: Secondary | ICD-10-CM

## 2018-04-15 LAB — BASIC METABOLIC PANEL
Anion gap: 10 (ref 5–15)
BUN: 30 mg/dL — ABNORMAL HIGH (ref 8–23)
CO2: 27 mmol/L (ref 22–32)
Calcium: 9.3 mg/dL (ref 8.9–10.3)
Chloride: 99 mmol/L (ref 98–111)
Creatinine, Ser: 1.57 mg/dL — ABNORMAL HIGH (ref 0.61–1.24)
GFR calc Af Amer: 52 mL/min — ABNORMAL LOW (ref >60)
GFR calc non Af Amer: 45 mL/min — ABNORMAL LOW (ref >60)
Glucose, Bld: 110 mg/dL — ABNORMAL HIGH (ref 70–99)
Potassium: 3.5 mmol/L (ref 3.5–5.1)
Sodium: 136 mmol/L (ref 135–145)

## 2018-04-15 LAB — GLUCOSE, CAPILLARY
Glucose-Capillary: 120 mg/dL — ABNORMAL HIGH (ref 70–99)
Glucose-Capillary: 119 mg/dL — ABNORMAL HIGH (ref 70–99)
Glucose-Capillary: 114 mg/dL — ABNORMAL HIGH (ref 70–99)
Glucose-Capillary: 74 mg/dL (ref 70–99)
Glucose-Capillary: 102 mg/dL — ABNORMAL HIGH (ref 70–99)
Glucose-Capillary: 84 mg/dL (ref 70–99)

## 2018-04-15 LAB — CBC WITH DIFFERENTIAL/PLATELET
Abs Immature Granulocytes: 0.06 10*3/uL (ref 0.00–0.07)
Basophils Absolute: 0 10*3/uL (ref 0.0–0.1)
Basophils Relative: 1 %
Eosinophils Absolute: 0.3 10*3/uL (ref 0.0–0.5)
Eosinophils Relative: 4 %
HCT: 48 % (ref 39.0–52.0)
Hemoglobin: 15.3 g/dL (ref 13.0–17.0)
Immature Granulocytes: 1 %
Lymphocytes Relative: 13 %
Lymphs Abs: 1.2 10*3/uL (ref 0.7–4.0)
MCH: 29.5 pg (ref 26.0–34.0)
MCHC: 31.9 g/dL (ref 30.0–36.0)
MCV: 92.5 fL (ref 80.0–100.0)
Monocytes Absolute: 1.3 10*3/uL — ABNORMAL HIGH (ref 0.1–1.0)
Monocytes Relative: 15 %
Neutro Abs: 5.7 10*3/uL (ref 1.7–7.7)
Neutrophils Relative %: 66 %
Platelets: 264 10*3/uL (ref 150–400)
RBC: 5.19 MIL/uL (ref 4.22–5.81)
RDW: 13.5 % (ref 11.5–15.5)
WBC: 8.6 10*3/uL (ref 4.0–10.5)
nRBC: 0 % (ref 0.0–0.2)

## 2018-04-15 LAB — MAGNESIUM: Magnesium: 1.7 mg/dL (ref 1.7–2.4)

## 2018-04-15 MED ORDER — ENOXAPARIN SODIUM 40 MG/0.4ML ~~LOC~~ SOLN
40.0000 mg | SUBCUTANEOUS | Status: DC
  Administered 2018-04-15 – 2018-04-16 (×2): 40 mg via SUBCUTANEOUS
  Filled 2018-04-15 (×2): qty 0.4

## 2018-04-15 MED ORDER — PANTOPRAZOLE SODIUM 40 MG PO PACK
40.0000 mg | PACK | Freq: Two times a day (BID) | ORAL | Status: DC
  Administered 2018-04-15 – 2018-04-16 (×2): 40 mg
  Filled 2018-04-15 (×2): qty 20

## 2018-04-15 MED ORDER — SODIUM CHLORIDE 3 % IN NEBU
4.0000 mL | INHALATION_SOLUTION | Freq: Every day | RESPIRATORY_TRACT | Status: DC
  Administered 2018-04-15 – 2018-04-16 (×2): 4 mL via RESPIRATORY_TRACT
  Filled 2018-04-15 (×3): qty 4
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
## 2018-04-15 NOTE — Consult Note (Signed)
NAME:  Scott Rivera, MRN:  [id number], DOB:  [birth year], LOS: 6 ADMISSION DATE:  04/09/2018, CONSULTATION DATE:  12/30 REFERRING MD:  Starla Link , CHIEF COMPLAINT:  Acute on chronic hypoxic respiratory failure    Brief History   67 year old pt w/ prior CVA, recurrent aspiration and trach/PEG dependent. Admitted w/ coffee ground emesis, possible perforated viscus and new asp PNA on 12/24. PCCM asked to see 12/30 for on-going oxygen needs.  -Last G-tube replacement April 2019  History of present illness   67 year old debilitated, trach/vent dependent male, w/ hx as listed below last hospitalized in Sept for recurrent aspiration PNA and resultant resp failure. Lives w/ and cared for by sister. Admitted on 12/29 after sister found him covered in coffee gd colored emesis. ER eval showed free intraperitoneal air raising concern for perforated viscus and new RLL airspace disease. Surgery consulted. Serial CT imaging showed improvement in free air. Surgery did not think surgery was indicated and felt free air might be incidental and  related to "either PEG site or pulmonary source".  Clinically he has improved w/ IV antibiotics and supportive care. He is tolerating tubefeeds. Pulmonary asked to see as he continues to have higher FIO2 requirements than baseline.   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  The pneumoperitoneum was small.  Surgical services was consulted and did not think the degree of air was significant.  Recommended antimicrobial therapy and repeat imaging as well as palliative care given poor candidate for surgical exploration 12/27: Repeat CT abdomen obtained.  Free intraperitoneal air decreased.  No bowel wall thickening or inflammation.  Right lower lobe airspace disease persist. 12/28:  Tube feeds resumed 12/29: General surgery signed off.  Still on higher levels of oxygen 3535 to 40%. 12/30: Pulmonary asked to see due to ongoing oxygen requirements at "10 L", actually on 35%  Consults:  Pulmonary consulted for hypoxia 12/30 Surgical services consulted 12/24 Palliative care consulted 12/25  Procedures:    Significant Diagnostic Tests:  CT abdomen follow-up on 12/27: Shows decreased volume of pneumoperitoneum.  Persistent right lower lobe airspace disease also in the right middle lobe.  No significant effusion Micro Data:  Sputum 12/26: nml flora 12/24: BC: 1/2 coag neg SA resp culutre 12/30>>>  Antimicrobials:  Zosyn 12/24 to 12/30  Interim history/subjective:  Appears very similar to last hospitalization.  No distress.  Refusing Szo suctioning and patient care  Objective   Blood pressure 132/85, pulse 96, temperature 97.6 F (36.4 C), temperature source Axillary, resp. rate 20, height 5\' 10"  (1.778 m), weight 75.5 kg, SpO2 96 %.    FiO2 (%):  [35 %-60 %] 35 %   Intake/Output Summary (Last 24 hours) at 04/15/2018 1032 Last data filed at 04/15/2018 0600 Gross per 24 hour  Intake 869.04 ml  Output 700 ml  Net 169.04 ml   Filed Weights   04/13/18 0500 04/14/18 0500 04/15/18 0500  Weight: 73 kg 73.6 kg 75.5 kg    Examination: General: Debilitated 68 year old African-American male resting in bed HENT: Normocephalic mucous membranes moist poor dentition tracheostomy unremarkable Lungs: Coarse scattered rhonchi no accessory use thin white secretions Cardiovascular: Regular rate and rhythm Abdomen: Soft, no tenderness to palpation PEG tube unremarkable Extremities: Right hand is contracted and in a brace, both feet with bilateral foot  drop, has bilateral dependent edema Neuro: Awake, nonverbal, purposeful, intermittently follows commands GU: Yellow urine  Resolved Hospital Problem list     Assessment & Plan:  Acute on chronic hypoxic respiratory  failure in the setting of recent aspiration pneumonitis -Now with new FiO2 requirement -Portable chest x-ray personally reviewed demonstrates persistent right greater than left airspace disease -Refuses suctioning from time to time -Very much doubt that the pneumoperitoneum had anything to do with pulmonary etiology -Has completed 7 days of antibiotics Plan/recommendation Repeat sputum culture Add hypertonic saline Agree with holding diuresis, I do not think this is volume overload He apparently had not been on oxygen prior, he will minimally need humidification for his tracheostomy, this is just for routine maintenance He may require oxygen for an extended period of time as he recovers from his pneumonia  Upper GI bleed, possible hematemesis Pneumoperitoneum of unclear etiology, no obvious etiology.  With vomiting wonder about transient Mallory-Weiss tear?  Plan Continue twice daily PPI Defer to internal medicine Re: GI consultation  AKI Exacerbated by diuresis Plan Trend chemistry Hold Lasix  Dysphasia following CVA Plan Advance PEG tube feeds as tolerated  Prior CVA Plan Supportive care  Best practice:  Diet: tubefeedsd Pain/Anxiety/Delirium protocol (if indicated): na VAP protocol (if indicated): na DVT prophylaxis: scd GI prophylaxis: ppi bid  Glucose control: na Mobility: br Code Status: full code  Family Communication: na Disposition: sdu.  He has residual hypoxia from aspiration pneumonia after vomiting blood.  Hemodynamically he is otherwise stable.  Expect higher FiO2 requirements as he recovers from his pneumonia.  We will go ahead and check repeat sputum culture to rule out healthcare associated organism, also add hypertonic saline to assist with mucus clearance.  Otherwise treatment is supportive.  He should be discharged home on humidified trach collar, may require oxygen.  No role for bronchoscopy.  Labs   CBC: Recent Labs  Lab 04/09/18 0805  04/10/18 0802 04/11/18 0523 04/12/18 0847 04/13/18 1123 04/14/18 0355  WBC 14.2* 15.6* 12.8* 12.8* 9.2 9.1  NEUTROABS 11.3*  --  9.7* 9.0* 6.2 5.5  HGB 17.8* 15.4 14.1 15.3 15.4 16.3  HCT 58.9* 51.8 48.5 49.1 50.3 52.1*  MCV 96.1 99.8 99.2 93.3 95.8 92.4  PLT 297 280 288 327 327 094    Basic Metabolic Panel: Recent Labs  Lab 04/10/18 0802 04/11/18 0523 04/12/18 0847 04/13/18 1123 04/14/18 0355  NA 145 147* 144 142 138  K 4.2 3.7 3.9 3.2* 3.7  CL 107 111 106 101 95*  CO2 27 24 27 24 27   GLUCOSE 78 78 95 97 85  BUN 25* 15 13 14 19   CREATININE 1.13 0.91 0.97 1.24 1.70*  CALCIUM 9.5 9.6 9.8 9.6 9.5  MG 2.1 2.0 1.6* 1.6* 1.5*   GFR: Estimated Creatinine Clearance: 43.5 mL/min (A) (by C-G formula based on SCr of 1.7 mg/dL (H)). Recent Labs  Lab 04/11/18 0523 04/12/18 0847 04/13/18 1123 04/14/18 0355  WBC 12.8* 12.8* 9.2 9.1    Liver Function Tests: Recent Labs  Lab 04/09/18 0849 04/09/18 0905 04/10/18 0802 04/11/18 0523 04/12/18 0847  AST 24 28 18 17 22   ALT QUANTITY NOT SUFFICIENT, UNABLE TO PERFORM TEST 16 14 14 12   ALKPHOS 96 97 79 71 69  BILITOT QUANTITY NOT SUFFICIENT, UNABLE TO PERFORM TEST 1.1 1.2 1.2 1.5*  PROT QUANTITY NOT SUFFICIENT, UNABLE TO PERFORM TEST 8.2* 8.0 7.3 8.0  ALBUMIN QUANTITY NOT SUFFICIENT, UNABLE TO PERFORM TEST 3.2* 3.1* 3.1* 2.8*   Recent Labs  Lab 04/09/18 0849  LIPASE 23   No results for input(s): AMMONIA in the last 168 hours.  ABG    Component Value Date/Time   PHART 7.378 04/09/2018 0847   PCO2ART 56.9 (H) 04/09/2018 0847   PO2ART 31.0 (LL) 04/09/2018 0847   HCO3 33.3 (H) 04/09/2018 0847   TCO2 35 (H) 04/09/2018 0847   ACIDBASEDEF 1.5 12/11/2016 0410   O2SAT 55.0 04/09/2018 0847     Coagulation Profile: No results for input(s): INR, PROTIME in the last 168 hours.  Cardiac Enzymes: Recent Labs  Lab 04/09/18 0805  TROPONINI <0.03    HbA1C: Hgb A1c MFr Bld  Date/Time Value Ref Range Status  12/09/2016  02:19 PM 5.7 (H) 4.8 - 5.6 % Final    Comment:    (NOTE) Pre diabetes:          5.7%-6.4% Diabetes:              >6.4% Glycemic control for   <7.0% adults with diabetes   10/21/2016 02:53 AM 5.4 4.8 - 5.6 % Final    Comment:    (NOTE)         Pre-diabetes: 5.7 - 6.4         Diabetes: >6.4         Glycemic control for adults with diabetes: <7.0     CBG: Recent Labs  Lab 04/09/18 1319 04/14/18 1240 04/14/18 1949 04/15/18 0349 04/15/18 0734  GLUCAP 113* 91 119* 120* 119*    Review of Systems:   Not able   Past Medical History  He,  has a past medical history of Acute pulmonary edema ([place]), Anemia due to chronic kidney disease, ARF (acute renal failure) ([place]) (10/19/2016), Aspiration pneumonia ([place]), Cerebellar infarct ([place]) (10/19/2016), Chronic kidney disease, stage 3 (moderate) (H[facility]), CKD (chronic kidney disease) stage 2, GFR 60-89 ml/min (01/02/2017), Disease characterized by destruction of skeletal muscle (10/15/2016), Gastrostomy tube obstruction ([place]), HCAP (healthcare-associated pneumonia), Helicobacter pylori infection (09/21/2012), History of adenomatous polyp of colon (07/28/2015), Hypertension, MDR Acinetobacter baumannii infection, Pneumothorax, closed, traumatic, initial encounter (12/10/2016), Renal insufficiency, Rhabdomyolysis (10/19/2016), S/P percutaneous endoscopic gastrostomy (PEG) tube placement ([place]) (03/30/2017), 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   Surgical History    Past Surgical History:  Procedure Laterality Date  . IR GASTROSTOMY TUBE MOD SED  10/27/2016  . IR PATIENT EVAL TECH 0-60 MINS  01/05/2017  . IR PATIENT EVAL TECH 0-60 MINS  01/09/2017  . IR PATIENT EVAL TECH 0-60 MINS  03/03/2017  . IR PATIENT EVAL TECH 0-60 MINS  04/03/2017  . IR REPLACE G-TUBE SIMPLE WO FLUORO  11/20/2017  . IR [facility] TUBE PERCUT W/FLUORO  07/23/2017  . KNEE SURGERY    . TRACHEOSTOMY  TUBE PLACEMENT       Social History   reports that he has quit smoking. He has never used smokeless tobacco. He reports previous alcohol use. He reports previous drug use.   Family History   His family history includes Diabetes Mellitus II in his brother. There is no history of CAD or Stroke.   Allergies No Known Allergies   Home Medications  Prior to Admission medications   Medication Sig Start Date End Date Taking? Authorizing Provider  acetaminophen (TYLENOL) 500 MG tablet Place 1,000 mg into feeding tube 3 (three) times daily.    Yes [provider]  amLODipine (NORVASC) 10 MG tablet Place 1 tablet (10 mg total) into feeding tube daily.  11/23/17  Yes Kayleen Memos, DO  aspirin 81 MG chewable tablet Place 1 tablet (81 mg total) into feeding tube daily. 04/05/17  Yes Nita Sells, MD  atropine 1 % ophthalmic solution Place 2 drops under the tongue every 8 (eight) hours as needed (excessive secretion). Patient taking differently: Place 2 drops under the tongue 2 (two) times daily.  07/03/17  Yes Lavina Hamman, MD  bisacodyl (DULCOLAX) 10 MG suppository Place 1 suppository (10 mg total) rectally daily. 12/22/17  Yes Thurnell Lose, MD  cloNIDine (CATAPRES - DOSED IN MG/24 HR) 0.1 mg/24hr patch Place 1 patch (0.1 mg total) onto the skin once a week. Patient taking differently: Place 0.1 mg onto the skin every Tuesday.  04/10/17  Yes Nita Sells, MD  clopidogrel (PLAVIX) 75 MG tablet Place 1 tablet (75 mg total) into feeding tube daily. 01/11/17  Yes Cherene Altes, MD  Ferrous Sulfate (IRON) 325 (65 Fe) MG TABS Take by mouth.   Yes [provider]  gabapentin (NEURONTIN) 100 MG capsule 100 mg See admin instructions. Open 1 capsule (100 mg) and administer contents via feeding tube three times daily   Yes [provider]  ipratropium-albuterol (DUONEB) 0.5-2.5 (3) MG/3ML SOLN Take 3 mLs by nebulization every 4 (four) hours as needed. Patient  taking differently: Take 3 mLs by nebulization every 4 (four) hours as needed (shortness of breath/wheezing).  01/10/17  Yes Cherene Altes, MD  metoprolol tartrate (LOPRESSOR) 50 MG tablet Take 1 tablet (50 mg total) by mouth 2 (two) times daily. 12/21/17  Yes Thurnell Lose, MD  montelukast (SINGULAIR) 10 MG tablet Place 10 mg into feeding tube at bedtime.    Yes [provider]  mouth rinse LIQD solution 10 mLs by Mouth Rinse route 4 (four) times daily.   Yes [provider]  Nutritional Supplements (FEEDING SUPPLEMENT, JEVITY 1.2 CAL,) LIQD Place 1,000 mLs into feeding tube continuous. 11/22/17  Yes Hall, Carole N, DO  pantoprazole (PROTONIX) 40 MG tablet Take 1 tablet (40 mg total) by mouth daily. 12/21/17  Yes Thurnell Lose, MD  pravastatin (PRAVACHOL) 20 MG tablet Place 1 tablet (20 mg total) into feeding tube daily at 6 PM. Patient taking differently: Place 20 mg daily into feeding tube.  01/10/17  Yes Cherene Altes, MD  thiamine 100 MG tablet Place 1 tablet (100 mg total) into feeding tube daily. 01/11/17  Yes Cherene Altes, MD  vitamin C (ASCORBIC ACID) 500 MG tablet Take 500 mg by mouth daily.   Yes [provider]  Water For Irrigation, Sterile (FREE WATER) SOLN Place 100 mLs into feeding tube daily. Patient taking differently: Place 100 mLs into feeding tube every 3 (three) hours.  07/03/17  Yes Lavina Hamman, MD  Amino Acids-Protein Hydrolys (FEEDING SUPPLEMENT, PRO-STAT SUGAR FREE 64,) LIQD Take 30 mLs by mouth at bedtime.    [provider]  senna (SENOKOT) 8.6 MG TABS tablet Take 1 tablet by mouth daily.    [provider]     Critical care time: NA    Erick Colace ACNP-BC [place] Pager # [phone] OR # [phone] if no answer

## 2018-04-15 NOTE — Progress Notes (Signed)
Pt. Refused the 0000 CBG check for NT and RN. He raised his fists at [place] and mouthed the word "NO" along with shaking his head no. Will attempt again at a later time. Will continue to monitor.  Mariann Laster, RN

## 2018-04-15 NOTE — Progress Notes (Signed)
Right upper extremity venous duplex completed. Preliminary results -  There is no evidence of DVT or superficial thrombosis. [facility], [place] 04/15/2018, 11:33 AM

## 2018-04-16 LAB — BASIC METABOLIC PANEL
Anion gap: 10 (ref 5–15)
BUN: 27 mg/dL — ABNORMAL HIGH (ref 8–23)
CO2: 31 mmol/L (ref 22–32)
Calcium: 9.3 mg/dL (ref 8.9–10.3)
Chloride: 101 mmol/L (ref 98–111)
Creatinine, Ser: 1.27 mg/dL — ABNORMAL HIGH (ref 0.61–1.24)
GFR calc Af Amer: >60 mL/min (ref >60)
GFR calc non Af Amer: 58 mL/min — ABNORMAL LOW (ref >60)
Glucose, Bld: 122 mg/dL — ABNORMAL HIGH (ref 70–99)
Potassium: 3.4 mmol/L — ABNORMAL LOW (ref 3.5–5.1)
Sodium: 142 mmol/L (ref 135–145)

## 2018-04-16 LAB — CBC WITH DIFFERENTIAL/PLATELET
Abs Immature Granulocytes: 0.03 10*3/uL (ref 0.00–0.07)
Basophils Absolute: 0 10*3/uL (ref 0.0–0.1)
Basophils Relative: 0 %
Eosinophils Absolute: 0.5 10*3/uL (ref 0.0–0.5)
Eosinophils Relative: 6 %
HCT: 48.3 % (ref 39.0–52.0)
Hemoglobin: 14.6 g/dL (ref 13.0–17.0)
Immature Granulocytes: 0 %
Lymphocytes Relative: 18 %
Lymphs Abs: 1.4 10*3/uL (ref 0.7–4.0)
MCH: 28.8 pg (ref 26.0–34.0)
MCHC: 30.2 g/dL (ref 30.0–36.0)
MCV: 95.3 fL (ref 80.0–100.0)
Monocytes Absolute: 1.3 10*3/uL — ABNORMAL HIGH (ref 0.1–1.0)
Monocytes Relative: 16 %
Neutro Abs: 4.8 10*3/uL (ref 1.7–7.7)
Neutrophils Relative %: 60 %
Platelets: 302 10*3/uL (ref 150–400)
RBC: 5.07 MIL/uL (ref 4.22–5.81)
RDW: 13.7 % (ref 11.5–15.5)
WBC: 8.1 10*3/uL (ref 4.0–10.5)
nRBC: 0 % (ref 0.0–0.2)

## 2018-04-16 LAB — GLUCOSE, CAPILLARY
Glucose-Capillary: 116 mg/dL — ABNORMAL HIGH (ref 70–99)
Glucose-Capillary: 101 mg/dL — ABNORMAL HIGH (ref 70–99)
Glucose-Capillary: 98 mg/dL (ref 70–99)
Glucose-Capillary: 106 mg/dL — ABNORMAL HIGH (ref 70–99)
Glucose-Capillary: 111 mg/dL — ABNORMAL HIGH (ref 70–99)

## 2018-04-16 LAB — MAGNESIUM: Magnesium: 1.9 mg/dL (ref 1.7–2.4)

## 2018-04-16 MED ORDER — ATROPINE SULFATE 1 % OP SOLN: 2.0000 [drp] | Freq: Two times a day (BID) | OPHTHALMIC | Status: DC

## 2018-04-16 MED ORDER — FREE WATER: 100.0000 mL | Status: DC

## 2018-04-16 MED ORDER — PANTOPRAZOLE SODIUM 40 MG PO PACK: 40.0000 mg | PACK | Freq: Two times a day (BID) | ORAL | 0 refills | Status: DC

## 2018-04-16 MED ORDER — CLONIDINE 0.1 MG/24HR TD PTWK: 0.1000 mg | MEDICATED_PATCH | TRANSDERMAL | Status: DC

## 2018-04-16 MED ORDER — ONDANSETRON HCL 4 MG PO TABS: 4.0000 mg | ORAL_TABLET | Freq: Four times a day (QID) | ORAL | 0 refills | Status: DC | PRN

## 2018-04-16 NOTE — Consult Note (Signed)
[facility] Nurse wound consult note Reason for Consult: Patient with staff reported skin injury to buttock and sacrum. These areas are dry skin and there is no injury beneath it.  Continue to cleanse, protect and cover.  New medical adhesive related skin injury (MARSI) to right arm sustained yesterday as patient pulled off IV dressing incurring partial thickness tissue loss in two areas. Partial thickness tissue loss and an intact, serum filled blister. Wound type: MARSI Pressure Injury POA: N/A Measurement: Lateral 1.2 x 2cm x 0.1cm and Medial 1cm x 0.6cm x 0.1cm Wound bed: pink, moist Drainage (amount, consistency, odor) Scant serous Periwound: Intact with 0.4cm reabsorbing blister located medially and superior to the other medial wound. Dressing procedure/placement/frequency: Current treatment is with a silicone foam dressing.  I will add a wound contact layer of xeroform gauze for both the astringent and antimicrobial properties and continue with the silicone foam.  Guidance provided for Nursing via the Orders.  POC communicated to [facility] the Bedside RN today.  [facility] nursing team will not follow, but will remain available to this patient, the nursing and medical teams.  Please re-consult if needed. Thanks, Maudie Flakes, MSN, RN, [place], Arther Abbott  Pager# [phone]

## 2018-04-16 NOTE — Progress Notes (Signed)
Pt discharged home via [facility], Sister called and said [facility] did set up the oxygen and show her how to use it. Sister was concerned due to pt blood pressure being elevated, prn med given and blood pressure came down. Mother and sister aware pt is on the way home. Pt refused to sign AVS, Jennye Moccasin, Charge was made aware and verified refusal to sign. Pt vital signs were stable upon discharge.

## 2018-04-16 NOTE — Care Management Note (Addendum)
Case Management Note  Patient Details  Name: Scott Rivera MRN: [id number] Date of Birth: 1950-11-18  Subjective/Objective:                  discharged  Action/Plan: Home rn patient is active with [facility] in [place]/tct-agency yes patient per [facility] is active/orders and dc summary faxed to [phone]  Att: [facility] o2 [facility].  Will dc via [facility] when home o2 is in place. [facility]-karen Byrd with [facility] pt has o2 and humidification in home.  Informed rn of this and that patient can go home when she is ready. Expected Discharge Date:  04/16/18               Expected Discharge Plan:  [place]  In-House Referral:     Discharge planning Services  CM Consult  Post Acute Care Choice:  Home Health Choice offered to:  Patient  DME Arranged:    DME Agency:     HH Arranged:  RN [employer] Agency:  Other - See comment  Status of Service:  Completed, signed off  If discussed at [place] of Stay Meetings, dates discussed:    Additional Comments:  Leeroy Cha, RN 04/16/2018, 1:04 PM

## 2018-04-16 NOTE — Progress Notes (Addendum)
SATURATION QUALIFICATIONS: (This note is used to comply with regulatory documentation for home oxygen)  Patient Saturations on Room Air at Rest = 83%  Patient Saturations on Room Air while Ambulating = unable to assess%  Patient Saturations on  Liters of oxygen while Ambulating = Pt. Unable to ambulate%  Please briefly explain why patient needs home oxygen: RN discontinued oxygen and patient 02 saturation dropped to 83% while at rest.  Test completed at this step.

## 2018-04-16 NOTE — Progress Notes (Signed)
Placed PT on new ATC setup- uneventful. 

## 2018-04-16 NOTE — Progress Notes (Signed)
Patient to discharge home.  Pt. Sister [name] stated "I don't know how to setup the oxygen."  RN contacted [facility] updated them on the need for education regarding the oxygen tank.  RN informed [facility] was sending someone to the house to setup the oxygen.  RN has contacted Miss [name] multiple times and currently no one has arrived to setup the home oxygen.  RN contacted [facility] who will be transporting Mr. Remlinger to see if they could setup the tank for Miss Young, RN directed that they are unable to provide education.  RN currently awaiting [facility] to educate Miss Young on the setup of the oxygen that is already in place at the residence once education is completed [facility] will be contacted for transport.

## 2018-04-16 NOTE — Discharge Summary (Signed)
Physician Discharge Summary  Scott Rivera PPI:[id number] DOB: [birth year] DOA: 04/09/2018  PCP: Jamesetta Orleans, PA-C  Admit date: 04/09/2018 Discharge date: 04/16/2018  Admitted From: Home Disposition: Home  Recommendations for Outpatient Follow-up:  1. Follow up with PCP in 1 weeks 2. Follow-up with gastroenterology and pulmonary as an outpatient 3. Follow-up in the ED if symptoms worsen or new appear 4. 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 CT of the abdomen showed free intraperitoneal air concerning for perforated viscus along with right lower lobe infiltrate. Antibiotics were started. General surgery was consulted who recommended admission to hospitalist service.  Patient was managed conservatively with IV antibiotics.  His condition remained stable and there was no increasing abdominal tenderness or worsening leukocytosis.  Repeat CT scan showed decreasing pneumoperitoneum.  General surgery recommended to start tube feeding diet.  Patient tolerated tube feeding.  General surgery signed off.  Patient also was requiring up to 10 L oxygen via tracheostomy.  Patient completed 7-day course of antibiotic treatment.  PCCM also evaluated the patient and the  oxygen is being weaned down.  he will be discharged home with home health.  Discharge Diagnoses:  Principal Problem:   Pneumoperitoneum Active Problems:   Perforated viscus   Essential hypertension   History of cerebral infarction   Pressure injury of skin   Dysphagia, post-stroke   Tobacco abuse   Tachypnea   Diabetes mellitus (HCC)   Leukocytosis   CKD (chronic kidney disease) stage 2, GFR 60-89 ml/min   Goals of care, counseling/discussion   Bilateral chronic knee pain   Mixed hyperlipidemia   Anemia due to chronic kidney disease   Compensated metabolic alkalosis   History of tracheostomy   Acute respiratory failure with hypoxia (HCC)   Foley catheter in place on admission   Aspiration pneumonia of right lower lobe (HCC)   Nausea and vomiting   Malnutrition of moderate degree   Tracheostomy tube present ([place])   Gastrostomy tube dependent (HCC)   Gastroesophageal reflux disease without esophagitis   Chronic respiratory insufficiency  Probable perforated viscuspresenting with coffee-ground emesis/probable upper GI bleeding and intraperitoneal free air  -General surgery evaluated the patient and patient was managed conservatively with intravenous antibiotics.  Repeat CT done on 04/12/2018 showed decreasing volume of pneumoperitoneum.  General surgery is of the opinion that this might be incidental free air from PEG site or possibly pulmonary source.  No indication for surgery for abdominal sepsis.  Surgery thinks that patient would not need a long course of antibiotics. General surgery has signed off. -Patient has been resumed on tube feeds which he is tolerating -IV Protonix twice daily was started which was switched to Protonix via tube every 12 hours.  No further episodes of GI bleeding since admission.  Consider outpatient GI evaluation -Patient is a very high risk surgical candidate. Overall prognosis is guarded to poor.   Palliative care evaluated the patient.  Family  was not  interested in palliative care conversations. -Patient remains full code at this time.  Right lower lobe bacterial pneumonia with a concern for aspiration pneumonia -Treated with 7 days of Zosyn. 1 set of blood culture was positive for coagulase-negative staph, aerobic bottle only.  Probably contaminant. Strep pneumoniae and Legionella urinary antigen negative.   Acute hypoxic respiratory failure in a patient with chronic tracheostomy -Patient required up to 10 L/min oxygen by trach.  PCCM was consulted.  Oxygen is being weaned off.  He might need oxygen upon discharge if he is not  able to completely be weaned off -Patient received few doses of Lasix over the last few days.  Chest x-ray from 04/13/2018 shows improvement in right-sided aeration.   Hypernatremia -Treated with IV fluids.  Resolved  Leukocytosis -Probably secondary to above.   Resolved.  history of stroke with chronic bedridden status along with chronic tracheostomy and gastrostomy with chronic indwelling Foley's catheter -Overall prognosis very poor. Fall precautions -Palliative care evaluation appreciated.  Patient will remain full code for now.  Sister [name] not interested in palliative care discussions at this time.  Right arm swelling and some skin peeling -Right arm duplex ultrasound was negative for DVT.    Discharge Instructions  Discharge Instructions    Ambulatory referral to Gastroenterology   Complete by:  As directed    Probable GI bleed   Ambulatory referral to Pulmonology   Complete by:  As directed    Hypoxia/trach dependent   Call MD for:  difficulty breathing, headache or visual disturbances   Complete by:  As directed    Call MD for:  hives   Complete by:  As directed    Call MD for:  persistant dizziness or light-headedness   Complete by:  As directed    Call MD for:  persistant nausea and vomiting   Complete by:  As directed    Call MD for:  severe uncontrolled pain    Complete by:  As directed    Call MD for:  temperature >100.4   Complete by:  As directed    Discharge instructions   Complete by:  As directed    Bedrest Continue tube feeding     Allergies as of 04/16/2018   No Known Allergies     Medication List    STOP taking these medications   pantoprazole 40 MG tablet Commonly known as:  PROTONIX Replaced by:  pantoprazole sodium 40 mg/20 mL Pack     TAKE these medications   acetaminophen 500 MG tablet Commonly known as:  TYLENOL Place 1,000 mg into feeding tube 3 (three) times daily.   amLODipine 10 MG tablet Commonly known as:  NORVASC Place 1 tablet (10 mg total) into feeding tube daily.   aspirin 81 MG chewable tablet Place 1 tablet (81 mg total) into feeding tube daily.   atropine 1 % ophthalmic solution Place 2 drops under the tongue 2 (two) times daily.   bisacodyl 10 MG suppository Commonly known as:  DULCOLAX Place 1 suppository (10 mg total) rectally daily.   cloNIDine 0.1 mg/24hr patch Commonly known as:  CATAPRES - Dosed in mg/24 hr Place 1 patch (0.1 mg total) onto the skin every Tuesday.   clopidogrel 75 MG tablet Commonly known as:  PLAVIX Place 1 tablet (75 mg total) into feeding tube daily.   feeding supplement (JEVITY 1.2 CAL) Liqd Place 1,000 mLs into feeding tube continuous.   feeding supplement (PRO-STAT SUGAR FREE 64) Liqd Take 30 mLs by mouth  at bedtime.   free water Soln Place 100 mLs into feeding tube every 3 (three) hours.   gabapentin 100 MG capsule Commonly known as:  NEURONTIN 100 mg See admin instructions. Open 1 capsule (100 mg) and administer contents via feeding tube three times daily   ipratropium-albuterol 0.5-2.5 (3) MG/3ML Soln Commonly known as:  DUONEB Take 3 mLs by nebulization every 4 (four) hours as needed. What changed:  reasons to take this   Iron 325 (65 Fe) MG Tabs Take by mouth.   metoprolol tartrate 50 MG tablet Commonly known as:  LOPRESSOR Take 1 tablet  (50 mg total) by mouth 2 (two) times daily.   montelukast 10 MG tablet Commonly known as:  SINGULAIR Place 10 mg into feeding tube at bedtime.   mouth rinse Liqd solution 10 mLs by Mouth Rinse route 4 (four) times daily.   ondansetron 4 MG tablet Commonly known as:  ZOFRAN Place 1 tablet (4 mg total) into feeding tube every 6 (six) hours as needed for nausea.   pantoprazole sodium 40 mg/20 mL Pack Commonly known as:  PROTONIX Place 20 mLs (40 mg total) into feeding tube 2 (two) times daily. Replaces:  pantoprazole 40 MG tablet   pravastatin 20 MG tablet Commonly known as:  PRAVACHOL Place 1 tablet (20 mg total) into feeding tube daily at 6 PM. What changed:  when to take this   senna 8.6 MG Tabs tablet Commonly known as:  SENOKOT Take 1 tablet by mouth daily.   thiamine 100 MG tablet Place 1 tablet (100 mg total) into feeding tube daily.   vitamin C 500 MG tablet Commonly known as:  ASCORBIC ACID Take 500 mg by mouth daily.      Follow-up Information    Jamesetta Orleans, PA-C. Schedule an appointment as soon as possible for a visit in 1 week(s).   Specialty:  Internal Medicine Contact information: [place] [address] [phone]          No Known Allergies  Consultations: PCCM/general surgery  Procedures/Studies: Dg Chest 2 View  Result Date: 04/09/2018 CLINICAL DATA:  Rhonchi.  Vomiting. EXAM: CHEST - 2 VIEW COMPARISON:  Chest radiograph January 01, 2018 and chest CT January 29, 2018 FINDINGS: There is a tracheostomy with tracheostomy catheter tip 5.3 cm above the carina. No pneumothorax. There is scarring in the left mid lung region. There is interstitial thickening in the lungs bilaterally, stable. There is no frank edema or consolidation. Heart size and pulmonary vascularity are normal. No adenopathy. There are surgical clips in the left upper thorax region. There is evidence of old trauma in the right shoulder region with remodeling.  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 IMPRESSION: Areas of scarring and probable chronic bronchitis. No frank edema or consolidation. Stable cardiac silhouette. Postoperative changes as noted. No pneumothorax. 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 Posttraumatic change with remodeling right shoulder region. Electronically Signed   By: Lowella Grip III M.D.   On: 04/09/2018 10:56   Ct Abdomen Pelvis W Contrast  Result Date: 04/12/2018 CLINICAL DATA:  67 y.o. 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 CT of  the abdomen showed free intraperitoneal air concerning for perforated viscus along with right lower lobe infiltrate. Antibiotics were started. General surgery was consulted who recommended admission to hospitalist service. EXAM: CT ABDOMEN AND PELVIS WITH CONTRAST TECHNIQUE: Multidetector CT imaging of the abdomen and pelvis was performed using the standard protocol following bolus administration of intravenous contrast. CONTRAST:  156mL OMNIPAQUE IOHEXOL 300 MG/ML  SOLN COMPARISON:  CT abdomen pelvis, 04/09/2018 and older studies. FINDINGS: Lower chest: Patchy peribronchovascular right lower lobe opacity is similar to the prior CT. There is new opacity in the right middle lobe. This is most likely atelectasis. Hepatobiliary: Small hypoattenuating lesion in the anterior liver, segment 4B, unchanged. No new liver lesions. Normal gallbladder. No bile duct dilation. Pancreas: Unremarkable. No pancreatic ductal dilatation or surrounding inflammatory changes.  Spleen: Normal in size without focal abnormality. Adrenals/Urinary Tract: No adrenal masses. Absent left kidney, likely congenital. Right kidney normal in overall size and position. No mass, stone or hydronephrosis. Normal right ureter. Bladder mostly decompressed with a Foley catheter. Stomach/Bowel: Rectum is moderate to markedly distended with stool, but without wall thickening or adjacent inflammation. Colon is normal in caliber. No colonic wall thickening or adjacent inflammation. Small bowel is normal in caliber with no wall thickening or inflammation. Normal appendix visualized. Stomach is mostly decompressed. Gastrostomy tube pillars well position within the anterior aspect of the mid to distal stomach, unchanged. Vascular/Lymphatic: Aortic atherosclerotic changes extending into the iliac vessels. No adenopathy. Reproductive: Unremarkable. Other: There is still a small amount of free intraperitoneal air, although this has decreased since the prior CT. There is a small amount of air that lies along the mesenteric border of small-bowel loops in the left mid to lower abdomen. No ascites. Stable anterior abdominal wall sutures. Musculoskeletal: Advanced arthropathic changes of the hips with chronic avascular necrosis of the left femoral head. There are degenerative changes the visualized spine most evident at L4-L5. No fractures. No osteoblastic or osteolytic lesions IMPRESSION: 1. Free intraperitoneal air is decreased in amount from the prior CT. The source of this free air is unclear. There is no bowel wall thickening or inflammation to indicate a source of bowel perforation. Gastrostomy tube appears well positioned. 2. There is persistent opacity in the right lower lobe with opacity now seen in the right middle lobe. Middle lobe opacity is likely atelectasis. Lower lobe opacity may reflect atelectasis, pneumonia or aspiration pneumonitis. 3. Rectum remains significantly distended with stool, but without  rectal wall thickening or adjacent inflammation. 4. No new abnormalities. Electronically Signed   By: Lajean Manes M.D.   On: 04/12/2018 13:31   Ct Abdomen Pelvis W Contrast  Result Date: 04/09/2018 CLINICAL DATA:  Nausea, vomited blood during the night, aspirated, history stroke, stage III chronic kidney disease, hypertension EXAM: CT ABDOMEN AND PELVIS WITH CONTRAST TECHNIQUE: Multidetector CT imaging of the abdomen and pelvis was performed using the standard protocol following bolus administration of intravenous contrast. Sagittal and coronal MPR images reconstructed from axial data set. CONTRAST:  142mL ISOVUE-300 IOPAMIDOL (ISOVUE-300) INJECTION 61% IV. No oral contrast. COMPARISON:  12/18/2017 FINDINGS: Lower chest: Minimal atelectasis LEFT base. RIGHT lower lobe infiltrate which could represent in pneumonia or aspiration. Hepatobiliary: Vague 9 mm nonspecific low-attenuation focus anteriorly within liver. No other focal hepatic abnormalities. Gallbladder unremarkable. No biliary dilatation. Pancreas: Normal appearance Spleen: Normal appearance Adrenals/Urinary Tract: Adrenal glands normal appearance. Absent LEFT kidney question congenital absence versus prior nephrectomy. RIGHT kidney and ureter normal appearance. Bladder decompressed by Foley catheter. Stomach/Bowel: Significantly increased  stool within rectum with additional increased stool in ascending and transverse colon. Appendix not definitely visualized. Gastrostomy tube in stomach. Mild wall thickening of the distal esophagus question esophagitis versus mass, though distal esophageal injury from vomiting could cause wall thickening. Stomach and remaining bowel loops unremarkable. Vascular/Lymphatic: Extensive atherosclerotic calcifications aorta and iliac arteries without aneurysm. No adenopathy. Reproductive: No prostatic enlargement. Other: Free air identified in upper abdomen. Unless patient has had recent intra-abdominal surgery or recent  placement of gastrostomy tube, findings would be consistent with a perforated viscus. No free fluid. Small BILATERAL inguinal hernias containing fat larger on LEFT. Musculoskeletal: Diffuse osseous demineralization. Avascular necrosis changes of the femoral heads bilaterally. Degenerative disc disease changes lumbar spine. IMPRESSION: Free intraperitoneal air; unless patient has had a recent intra-abdominal procedure or recent placement of the gastrostomy tube, findings are consistent with a perforated viscus. Increased stool in rectum as well as the proximal half of the colon. Wall thickening of distal esophagus which could be related to esophagitis, mass, or, in a patient with a history of vomiting, distal esophageal injury. RIGHT lower lobe infiltrate question pneumonia versus aspiration. Small BILATERAL inguinal hernias containing fat. Critical Value/emergent results were called by telephone at the time of interpretation on 04/09/2018 at 11:11 am to Dr. Orlie Dakin , who verbally acknowledged these results. Electronically Signed   By: Lavonia Dana M.D.   On: 04/09/2018 11:11   Dg Chest Port 1 View  Result Date: 04/15/2018 CLINICAL DATA:  67 year old male with a history EXAM: PORTABLE CHEST 1 VIEW COMPARISON:  04/13/2018, 04/11/2018, 04/10/2018 FINDINGS: Cardiomediastinal silhouette unchanged in size and contour. Unchanged tracheostomy tube with surgical clips projecting over the left clavicle. Mixed interstitial and airspace opacity persist in the right mid and lower lung, unchanged from the prior. No new pleural effusion or pneumothorax. Partially imaged bone lesion of the proximal left humerus. IMPRESSION: Similar appearance of the chest x-ray with mild mixed interstitial and airspace disease of the right lung. Unchanged tracheostomy tube. Electronically Signed   By: Corrie Mckusick D.O.   On: 04/15/2018 08:11   Dg Chest Port 1 View  Result Date: 04/13/2018 CLINICAL DATA:  dyspnea EXAM: PORTABLE  CHEST 1 VIEW COMPARISON:  Is 04/11/2018 FINDINGS: Tracheostomy tube is in place, tip unchanged within the mid trachea. The heart size is mildly enlarged. Patchy opacity within the RIGHT LOWER lobe has improved. There is minimal persistent MEDIAL opacity and RIGHT perihilar opacity. LEFT lung is clear. IMPRESSION: Improved aeration in the RIGHT LOWER lobe. Electronically Signed   By: Nolon Nations M.D.   On: 04/13/2018 12:15   Dg Chest Port 1 View  Result Date: 04/11/2018 CLINICAL DATA:  Shortness of breath and cough. EXAM: PORTABLE CHEST 1 VIEW COMPARISON:  Chest x-ray from yesterday. FINDINGS: Unchanged tracheostomy tube. Stable cardiomediastinal silhouette. Normal pulmonary vascularity. Increased elevation of the right hemidiaphragm. Right basilar airspace disease is similar to prior study. The left lung is clear. No large pleural effusion. No pneumothorax. No acute osseous abnormality. IMPRESSION: Unchanged right basilar infiltrate and/or atelectasis. Electronically Signed   By: Titus Dubin M.D.   On: 04/11/2018 10:37   Dg Chest Port 1 View  Result Date: 04/10/2018 CLINICAL DATA:  Dyspnea. EXAM: PORTABLE CHEST 1 VIEW COMPARISON:  04/09/2018 FINDINGS: 0500 hours. Tracheostomy tube again noted. Left lung remains clear. Interval progression of airspace disease at the right base. Interstitial markings are diffusely coarsened with chronic features. The cardiopericardial silhouette is within normal limits for size. Bones are diffusely demineralized. Telemetry leads  overlie the chest. IMPRESSION: Interval progression of airspace disease at the right base. Given the interval increase in asymmetric elevation right hemidiaphragm a component of the opacity at the right base may be atelectatic. Electronically Signed   By: Misty Stanley M.D.   On: 04/10/2018 07:45   Vas [place] Upper Extremity Venous Duplex  Result Date: 04/15/2018 UPPER VENOUS STUDY  Indications: Swelling Performing Technologist: Toma Copier RVS  Examination Guidelines: A complete evaluation includes B-mode imaging, spectral Doppler, color Doppler, and power Doppler as needed of all accessible portions of each vessel. Bilateral testing is considered an integral part of a complete examination. Limited examinations for reoccurring indications may be performed as noted.  Right Findings: +----------+------------+----------+---------+-----------+-------+ RIGHT     CompressiblePropertiesPhasicitySpontaneousSummary +----------+------------+----------+---------+-----------+-------+ IJV           Full                 Yes       Yes            +----------+------------+----------+---------+-----------+-------+ Subclavian                         Yes       Yes            +----------+------------+----------+---------+-----------+-------+ Axillary      Full                 Yes       Yes            +----------+------------+----------+---------+-----------+-------+ Brachial      Full                 Yes       Yes            +----------+------------+----------+---------+-----------+-------+ Radial        Full                 Yes       Yes            +----------+------------+----------+---------+-----------+-------+ Ulnar         Full                                          +----------+------------+----------+---------+-----------+-------+ Cephalic      Full                                          +----------+------------+----------+---------+-----------+-------+ Basilic       Full                                          +----------+------------+----------+---------+-----------+-------+  Left Findings: +----------+------------+----------+---------+-----------+-------+ LEFT      CompressiblePropertiesPhasicitySpontaneousSummary +----------+------------+----------+---------+-----------+-------+ Subclavian                         Yes       Yes             +----------+------------+----------+---------+-----------+-------+  Summary:  Right: No evidence of deep vein thrombosis in the upper extremity. No evidence of superficial vein thrombosis in the upper extremity. No evidence of thrombosis in the subclavian.  Left: No evidence of thrombosis in  the subclavian.  *See table(s) above for measurements and observations.  Diagnosing physician: Deitra Mayo MD Electronically signed by Deitra Mayo MD on 04/15/2018 at 5:12:32 PM.    Final       Subjective: Patient seen and examined at bedside.  He is awake but nonverbal, poor historian.  Nods his head to some questions.  No overnight fever or vomiting reported.  Discharge Exam: Vitals:   04/16/18 1150 04/16/18 1200  BP: (!) 168/73 (!) 154/79  Pulse: (!) 106 (!) 106  Resp: (!) 23 (!) 21  Temp:    SpO2: 94% 94%   Vitals:   04/16/18 0830 04/16/18 0844 04/16/18 1150 04/16/18 1200  BP: (!) 158/86  (!) 168/73 (!) 154/79  Pulse: (!) 101  (!) 106 (!) 106  Resp: (!) 22  (!) 23 (!) 21  Temp:      TempSrc:      SpO2: 93% 94% 94% 94%  Weight:      Height:        General exam: Looks older than stated age.  Nonverbal.    Nods head to some questions. No acute distress  neck: Trach in place respiratory system: Bilateral decreased breath sounds at bases, with  scattered crackles; mostly in the bases.   Cardiovascular system: Rate controlled, S1-S2 heard Gastrointestinal system: Abdomen is still slightly distended, soft and nontender.  Bowel sounds sluggish.  Gastrostomy tube in place  extremities: No cyanosis; right arm swelling present    The results of significant diagnostics from this hospitalization (including imaging, microbiology, ancillary and laboratory) are listed below for reference.     Microbiology: Recent Results (from the past 240 hour(s))  MRSA PCR Screening     Status: None   Collection Time: 04/09/18  5:35 PM  Result Value Ref Range Status   MRSA by PCR NEGATIVE  NEGATIVE Final    Comment:        The GeneXpert MRSA Assay (FDA approved for NASAL specimens only), is one component of a comprehensive MRSA colonization surveillance program. It is not intended to diagnose MRSA infection nor to guide or monitor treatment for MRSA infections. Performed at [facility], [place] [address], [place], [place] [address]   Culture, blood (routine x 2)     Status: None   Collection Time: 04/09/18  6:30 PM  Result Value Ref Range Status   Specimen Description   Final    BLOOD LEFT HAND Performed at [name] [address] [place], [place] [address]    Special Requests   Final    BOTTLES DRAWN AEROBIC ONLY Blood Culture results may not be optimal due to an inadequate volume of blood received in culture bottles Performed at [facility] [address], [place], [place] [address]    Culture   Final    NO GROWTH 5 DAYS Performed at [facility], [place] [address] [place], [place] [address]    Report Status 04/14/2018 FINAL  Final  Culture, blood (routine x 2)     Status: Abnormal   Collection Time: 04/09/18  6:38 PM  Result Value Ref Range Status   Specimen Description   Final    BLOOD RIGHT ANTECUBITAL Performed at [facility] [address] [place], [place] [address]    Special Requests   Final    BOTTLES DRAWN AEROBIC ONLY Blood Culture adequate volume Performed at [facility] [address] [place], [place] [address]  Culture  Setup Time   Final    GRAM POSITIVE COCCI AEROBIC BOTTLE ONLY CRITICAL RESULT CALLED TO, READ BACK BY AND VERIFIED WITH: D. WOFFORD, PHARMD ([facility]) AT 1050 ON 04/11/18 BY C. JESSUP, MLT.    Culture (A)  Final    STAPHYLOCOCCUS SPECIES (COAGULASE NEGATIVE) THE SIGNIFICANCE OF ISOLATING THIS ORGANISM FROM A SINGLE SET OF BLOOD CULTURES WHEN MULTIPLE SETS ARE DRAWN IS UNCERTAIN. PLEASE NOTIFY THE MICROBIOLOGY DEPARTMENT  WITHIN ONE WEEK IF SPECIATION AND SENSITIVITIES ARE REQUIRED. Performed at [facility], [place] [address] [place], [place] [address]    Report Status 04/13/2018 FINAL  Final  Blood Culture ID Panel (Reflexed)     Status: None   Collection Time: 04/09/18  6:38 PM  Result Value Ref Range Status   Enterococcus species NOT DETECTED NOT DETECTED Final   Listeria monocytogenes NOT DETECTED NOT DETECTED Final   Staphylococcus species NOT DETECTED NOT DETECTED Final   Staphylococcus aureus (BCID) NOT DETECTED NOT DETECTED Final   Streptococcus species NOT DETECTED NOT DETECTED Final   Streptococcus agalactiae NOT DETECTED NOT DETECTED Final   Streptococcus pneumoniae NOT DETECTED NOT DETECTED Final   Streptococcus pyogenes NOT DETECTED NOT DETECTED Final   Acinetobacter baumannii NOT DETECTED NOT DETECTED Final   Enterobacteriaceae species NOT DETECTED NOT DETECTED Final   Enterobacter cloacae complex NOT DETECTED NOT DETECTED Final   Escherichia coli NOT DETECTED NOT DETECTED Final   Klebsiella oxytoca NOT DETECTED NOT DETECTED Final   Klebsiella pneumoniae NOT DETECTED NOT DETECTED Final   Proteus species NOT DETECTED NOT DETECTED Final   Serratia marcescens NOT DETECTED NOT DETECTED Final   Haemophilus influenzae NOT DETECTED NOT DETECTED Final   Neisseria meningitidis NOT DETECTED NOT DETECTED Final   Pseudomonas aeruginosa NOT DETECTED NOT DETECTED Final   Candida albicans NOT DETECTED NOT DETECTED Final   Candida glabrata NOT DETECTED NOT DETECTED Final   Candida krusei NOT DETECTED NOT DETECTED Final   Candida parapsilosis NOT DETECTED NOT DETECTED Final   Candida tropicalis NOT DETECTED NOT DETECTED Final    Comment: Performed at [facility], [place] [address], [place], [place] [address]  Expectorated sputum assessment w rflx to resp cult     Status: None   Collection Time: 04/11/18  4:05 PM  Result Value Ref Range Status   Specimen Description TRACHEAL ASPIRATE  Final    Special Requests NONE  Final   Sputum evaluation   Final    THIS SPECIMEN IS ACCEPTABLE FOR SPUTUM CULTURE Performed at [facility], [place] [address] [place], [place] [address]    Report Status 04/11/2018 FINAL  Final  Culture, respiratory     Status: None   Collection Time: 04/11/18  4:06 PM  Result Value Ref Range Status   Specimen Description   Final    TRACHEAL ASPIRATE Performed at [facility] [address] [place], [place] [address]    Special Requests   Final    NONE Reflexed from [id number] Performed at [facility], [place] [address] [place], [place] [address]    Gram Stain   Final    FEW WBC PRESENT, PREDOMINANTLY PMN RARE GRAM POSITIVE COCCI RARE GRAM POSITIVE RODS RARE GRAM VARIABLE ROD    Culture   Final    FEW Consistent with normal respiratory flora. Performed at [facility], [place] [address], [place], [place] [address]    Report Status 04/14/2018 FINAL  Final  Culture, respiratory (non-expectorated)  Status: None (Preliminary result)   Collection Time: 04/15/18  2:48 PM  Result Value Ref Range Status   Specimen Description   Final    TRACHEAL ASPIRATE Performed at [place] [address], [place], [place] [address]    Special Requests   Final    NONE Performed at [facility], [place] [address], [place], [place] [address]    Gram Stain   Final    MODERATE WBC PRESENT, PREDOMINANTLY PMN FEW SQUAMOUS EPITHELIAL CELLS PRESENT MODERATE GRAM POSITIVE COCCI FEW GRAM NEGATIVE RODS FEW GRAM POSITIVE RODS Performed at [facility], [place] [address], [place], [place] [address]    Culture PENDING  Incomplete   Report Status PENDING  Incomplete     Labs: BNP (last 3 results) Recent Labs    06/29/17 0737 08/17/17 1439  BNP 10.5 82.5   Basic Metabolic Panel: Recent Labs  Lab 04/12/18 0847 04/13/18 1123 04/14/18 0355 04/15/18 1311 04/16/18 0324   NA 144 142 138 136 142  K 3.9 3.2* 3.7 3.5 3.4*  CL 106 101 95* 99 101  CO2 27 24 27 27 31   GLUCOSE 95 97 85 110* 122*  BUN 13 14 19  30* 27*  CREATININE 0.97 1.24 1.70* 1.57* 1.27*  CALCIUM 9.8 9.6 9.5 9.3 9.3  MG 1.6* 1.6* 1.5* 1.7 1.9   Liver Function Tests: Recent Labs  Lab 04/10/18 0802 04/11/18 0523 04/12/18 0847  AST 18 17 22   ALT 14 14 12   ALKPHOS 79 71 69  BILITOT 1.2 1.2 1.5*  PROT 8.0 7.3 8.0  ALBUMIN 3.1* 3.1* 2.8*   No results for input(s): LIPASE, AMYLASE in the last 168 hours. No results for input(s): AMMONIA in the last 168 hours. CBC: Recent Labs  Lab 04/12/18 0847 04/13/18 1123 04/14/18 0355 04/15/18 1311 04/16/18 0324  WBC 12.8* 9.2 9.1 8.6 8.1  NEUTROABS 9.0* 6.2 5.5 5.7 4.8  HGB 15.3 15.4 16.3 15.3 14.6  HCT 49.1 50.3 52.1* 48.0 48.3  MCV 93.3 95.8 92.4 92.5 95.3  PLT 327 327 349 264 302   Cardiac Enzymes: No results for input(s): CKTOTAL, CKMB, CKMBINDEX, TROPONINI in the last 168 hours. BNP: Invalid input(s): POCBNP CBG: Recent Labs  Lab 04/15/18 1921 04/15/18 2303 04/16/18 0419 04/16/18 0728 04/16/18 1220  GLUCAP 102* 84 116* 101* 98   D-Dimer No results for input(s): DDIMER in the last 72 hours. Hgb A1c No results for input(s): HGBA1C in the last 72 hours. Lipid Profile No results for input(s): CHOL, HDL, LDLCALC, TRIG, CHOLHDL, LDLDIRECT in the last 72 hours. Thyroid function studies No results for input(s): TSH, T4TOTAL, T3FREE, THYROIDAB in the last 72 hours.  Invalid input(s): FREET3 Anemia work up No results for input(s): VITAMINB12, FOLATE, FERRITIN, TIBC, IRON, RETICCTPCT in the last 72 hours. Urinalysis    Component Value Date/Time   COLORURINE YELLOW 12/18/2017 1138   APPEARANCEUR CLEAR 12/18/2017 1138   LABSPEC 1.025 12/18/2017 1138   PHURINE 7.0 12/18/2017 1138   GLUCOSEU NEGATIVE 12/18/2017 1138   HGBUR SMALL (A) 12/18/2017 1138   BILIRUBINUR NEGATIVE 12/18/2017 1138   [place] 12/18/2017 1138    PROTEINUR 30 (A) 12/18/2017 1138   NITRITE NEGATIVE 12/18/2017 1138   LEUKOCYTESUR MODERATE (A) 12/18/2017 1138   Sepsis Labs Invalid input(s): PROCALCITONIN,  WBC,  LACTICIDVEN Microbiology Recent Results (from the past 240 hour(s))  MRSA PCR Screening     Status: None   Collection Time: 04/09/18  5:35 PM  Result Value Ref Range  Status   MRSA by PCR NEGATIVE NEGATIVE Final    Comment:        The GeneXpert MRSA Assay (FDA approved for NASAL specimens only), is one component of a comprehensive MRSA colonization surveillance program. It is not intended to diagnose MRSA infection nor to guide or monitor treatment for MRSA infections. Performed at [facility], [place] [address] [place], [place] [address]   Culture, blood (routine x 2)     Status: None   Collection Time: 04/09/18  6:30 PM  Result Value Ref Range Status   Specimen Description   Final    BLOOD LEFT HAND Performed at [place] [address], [place], [place] [address]    Special Requests   Final    BOTTLES DRAWN AEROBIC ONLY Blood Culture results may not be optimal due to an inadequate volume of blood received in culture bottles Performed at [facility] [address], [place], [place] [address]    Culture   Final    NO GROWTH 5 DAYS Performed at [facility], [place] [address] [place], [place] [address]    Report Status 04/14/2018 FINAL  Final  Culture, blood (routine x 2)     Status: Abnormal   Collection Time: 04/09/18  6:38 PM  Result Value Ref Range Status   Specimen Description   Final    BLOOD RIGHT ANTECUBITAL Performed at [facility] [address] [place], [place] [address]    Special Requests   Final    BOTTLES DRAWN AEROBIC ONLY Blood Culture adequate volume Performed at [facility] [address] [place], [place] [address]    Culture  Setup Time   Final    GRAM POSITIVE  COCCI AEROBIC BOTTLE ONLY CRITICAL RESULT CALLED TO, READ BACK BY AND VERIFIED WITH: D. WOFFORD, PHARMD ([facility]) AT 1050 ON 04/11/18 BY C. JESSUP, MLT.    Culture (A)  Final    STAPHYLOCOCCUS SPECIES (COAGULASE NEGATIVE) THE SIGNIFICANCE OF ISOLATING THIS ORGANISM FROM A SINGLE SET OF BLOOD CULTURES WHEN MULTIPLE SETS ARE DRAWN IS UNCERTAIN. PLEASE NOTIFY THE MICROBIOLOGY DEPARTMENT WITHIN ONE WEEK IF SPECIATION AND SENSITIVITIES ARE REQUIRED. Performed at [facility], [place] [address] [place], [place] [address]    Report Status 04/13/2018 FINAL  Final  Blood Culture ID Panel (Reflexed)     Status: None   Collection Time: 04/09/18  6:38 PM  Result Value Ref Range Status   Enterococcus species NOT DETECTED NOT DETECTED Final   Listeria monocytogenes NOT DETECTED NOT DETECTED Final   Staphylococcus species NOT DETECTED NOT DETECTED Final   Staphylococcus aureus (BCID) NOT DETECTED NOT DETECTED Final   Streptococcus species NOT DETECTED NOT DETECTED Final   Streptococcus agalactiae NOT DETECTED NOT DETECTED Final   Streptococcus pneumoniae NOT DETECTED NOT DETECTED Final   Streptococcus pyogenes NOT DETECTED NOT DETECTED Final   Acinetobacter baumannii NOT DETECTED NOT DETECTED Final   Enterobacteriaceae species NOT DETECTED NOT DETECTED Final   Enterobacter cloacae complex NOT DETECTED NOT DETECTED Final   Escherichia coli NOT DETECTED NOT DETECTED Final   Klebsiella oxytoca NOT DETECTED NOT DETECTED Final   Klebsiella pneumoniae NOT DETECTED NOT DETECTED Final   Proteus species NOT DETECTED NOT DETECTED Final   Serratia marcescens NOT DETECTED NOT DETECTED Final   Haemophilus influenzae NOT DETECTED NOT DETECTED Final   Neisseria meningitidis NOT DETECTED NOT DETECTED Final   Pseudomonas aeruginosa NOT DETECTED NOT DETECTED Final  Candida albicans NOT DETECTED NOT DETECTED Final   Candida glabrata NOT DETECTED NOT DETECTED Final   Candida krusei NOT DETECTED NOT DETECTED Final    Candida parapsilosis NOT DETECTED NOT DETECTED Final   Candida tropicalis NOT DETECTED NOT DETECTED Final    Comment: Performed at [facility], [place] [address] [place], [place] [address]  Expectorated sputum assessment w rflx to resp cult     Status: None   Collection Time: 04/11/18  4:05 PM  Result Value Ref Range Status   Specimen Description TRACHEAL ASPIRATE  Final   Special Requests NONE  Final   Sputum evaluation   Final    THIS SPECIMEN IS ACCEPTABLE FOR SPUTUM CULTURE Performed at [facility], [place] [address] [place], [place] [address]    Report Status 04/11/2018 FINAL  Final  Culture, respiratory     Status: None   Collection Time: 04/11/18  4:06 PM  Result Value Ref Range Status   Specimen Description   Final    TRACHEAL ASPIRATE Performed at [place] [address], [place], [place] [address]    Special Requests   Final    NONE Reflexed from [id number] Performed at [facility], [place] [address], [place], [place] [address]    Gram Stain   Final    FEW WBC PRESENT, PREDOMINANTLY PMN RARE GRAM POSITIVE COCCI RARE GRAM POSITIVE RODS RARE GRAM VARIABLE ROD    Culture   Final    FEW Consistent with normal respiratory flora. Performed at [facility], [place] [address], [place], [place] [address]    Report Status 04/14/2018 FINAL  Final  Culture, respiratory (non-expectorated)     Status: None (Preliminary result)   Collection Time: 04/15/18  2:48 PM  Result Value Ref Range Status   Specimen Description   Final    TRACHEAL ASPIRATE Performed at [facility] [address] [place], [place] [address]    Special Requests   Final    NONE Performed at [facility], [place] [address] [place], [place] [address]    Gram Stain   Final    MODERATE WBC PRESENT, PREDOMINANTLY PMN FEW SQUAMOUS EPITHELIAL CELLS PRESENT MODERATE GRAM POSITIVE COCCI FEW GRAM NEGATIVE  RODS FEW GRAM POSITIVE RODS Performed at [facility], [place] [address], [place], [place] [address]    Culture PENDING  Incomplete   Report Status PENDING  Incomplete     Time coordinating discharge: 35 minutes  SIGNED:   Aline August, MD  [facility] 04/16/2018, 12:32 PM Pager: [phone]  If 7PM-7AM, please contact night-coverage [email or web address] Password [email or web address]
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
## 2018-04-16 NOTE — Progress Notes (Signed)
RN attempted a trial without 02 and patient began to desat. Into the low 80's.  Oxygen resumed, MD notified.

## 2018-04-18 LAB — CULTURE, RESPIRATORY W GRAM STAIN

## 2018-05-09 IMAGING — CT CT HEAD W/O CM
4 series · 16 of 47 positions shown, 18 images · non-contrast
Comparison: 10/20/2016

CLINICAL DATA: Altered level of consciousness

EXAM:
CT HEAD WITHOUT CONTRAST
TECHNIQUE: Contiguous axial images were obtained from the base of the skull
through the vertex without intravenous contrast.

[Series 3: head without · axial · non-contrast · 0.42mm/px · z∈[-116,-1]mm · 7 of 31 slices shown, 9 images]
[im 4/31  brain]
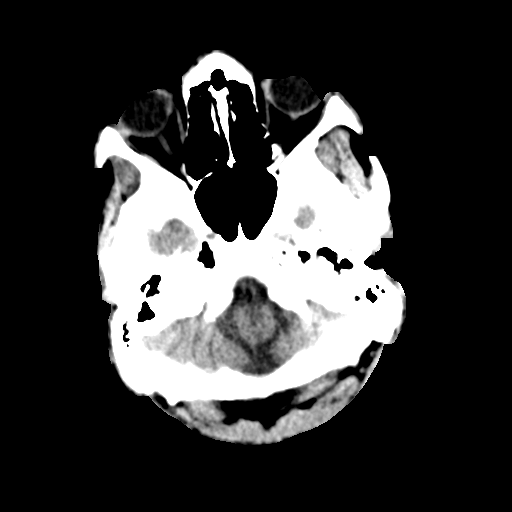
[im 4/31  bone]
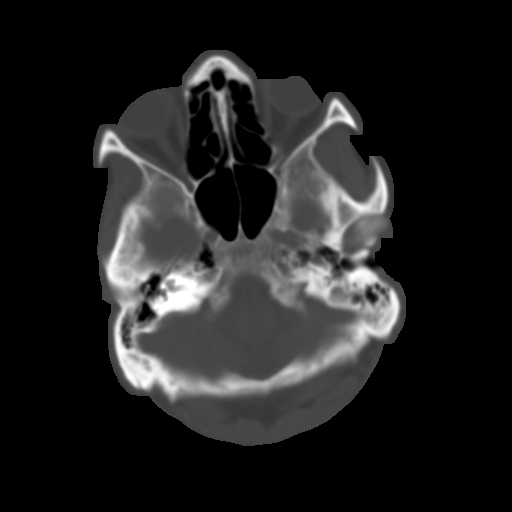
[im 8/31  brain]
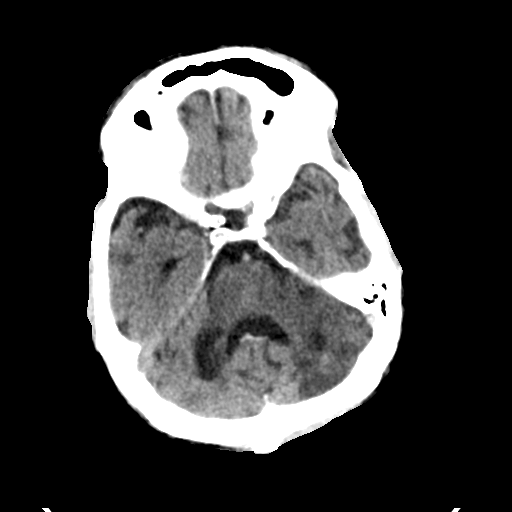
[im 12/31  brain]
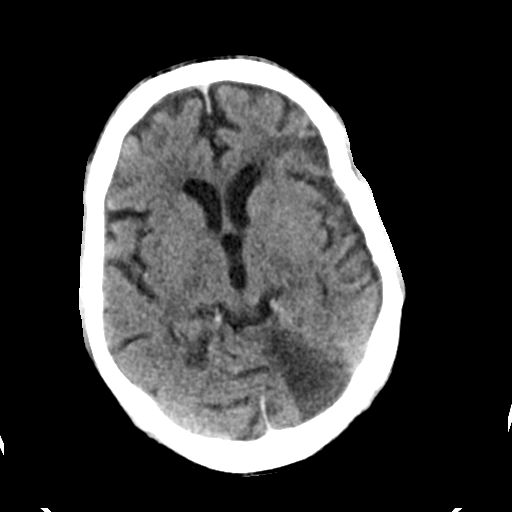
[im 16/31  brain]
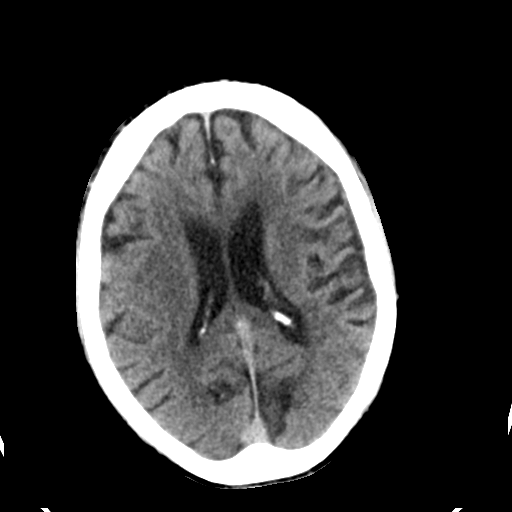
[im 19/31  brain]
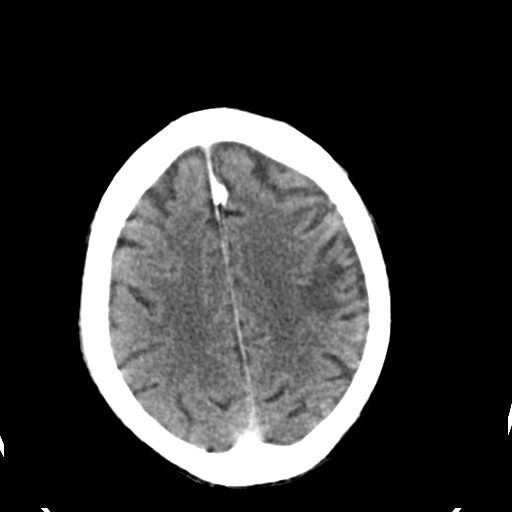
[im 19/31  bone]
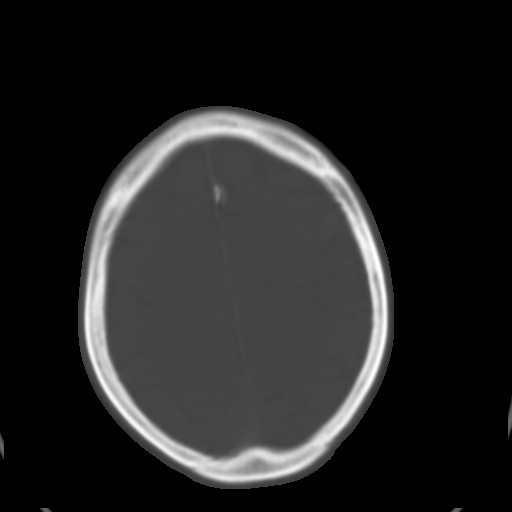
[im 23/31  brain]
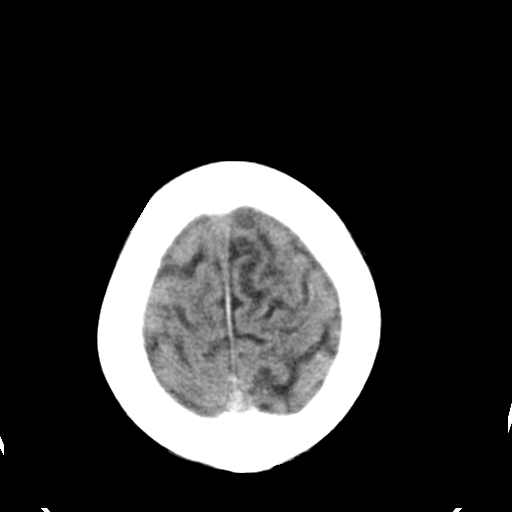
[im 27/31  brain]
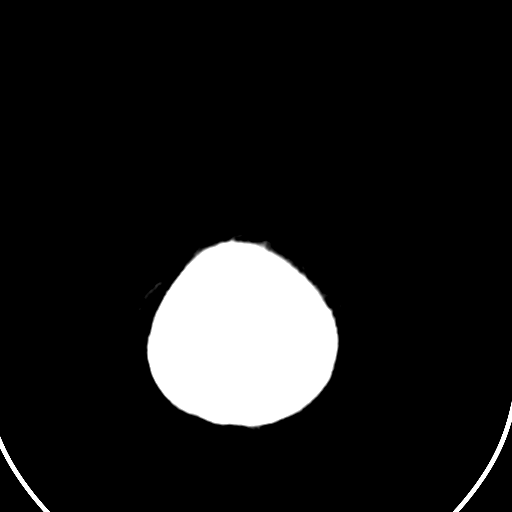

[Series 4: head bone · axial · 0.42mm/px · z∈[-117,-87]mm · 3 of 77 slices shown]
[im 8/77  bone]
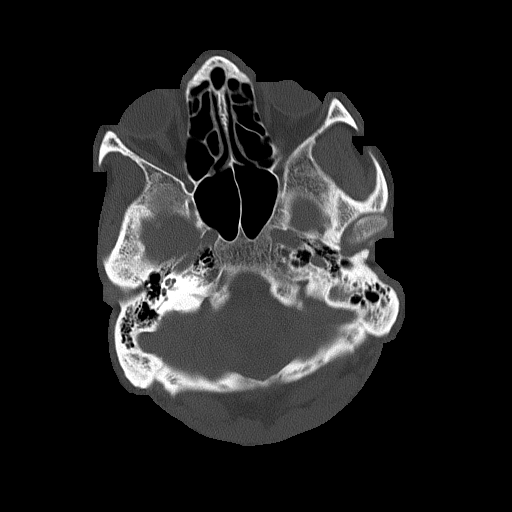
[im 16/77  bone]
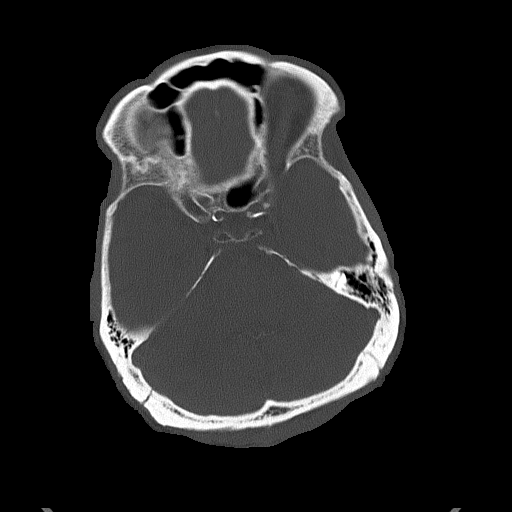
[im 23/77  bone]
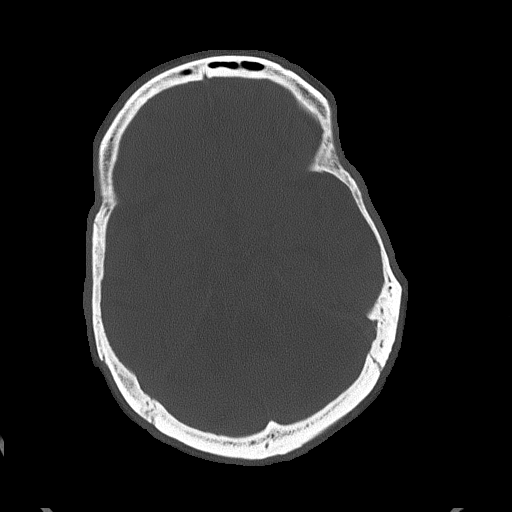

[Series 5: head without cor · coronal · non-contrast · 0.29mm/px · 3 of 64 slices shown]
[im 22/64  brain]
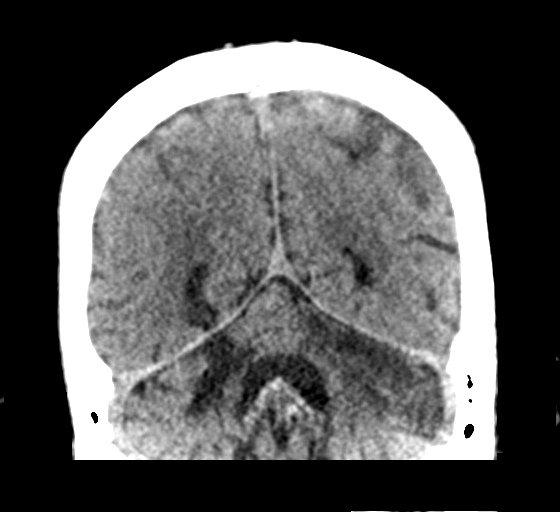
[im 29/64  brain]
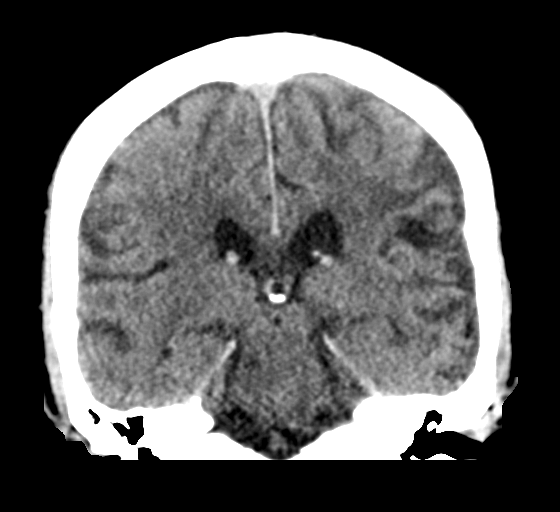
[im 36/64  brain]
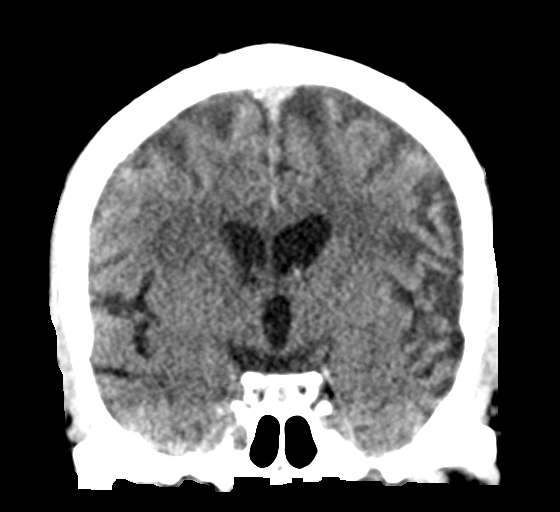

[Series 6: head without sag · sagittal · non-contrast · 0.29mm/px · 3 of 52 slices shown]
[im 18/52  brain]
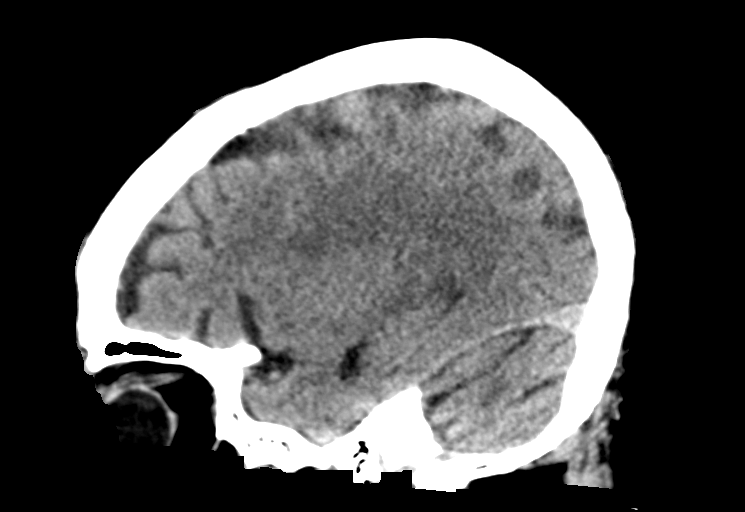
[im 26/52  brain]
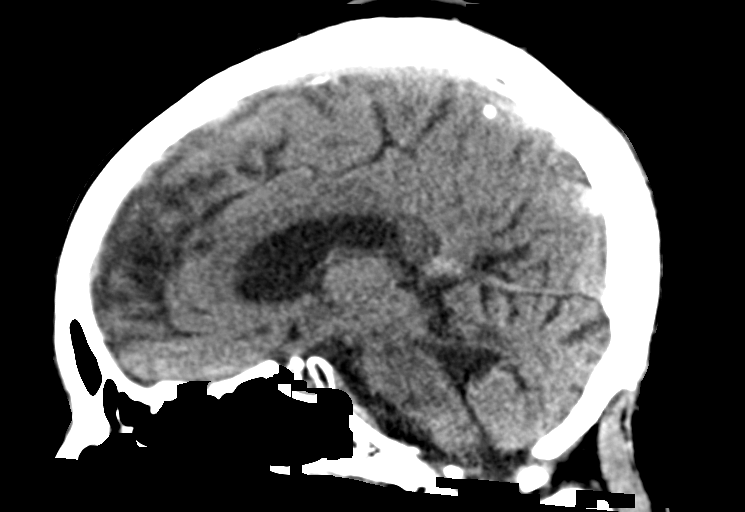
[im 35/52  brain]
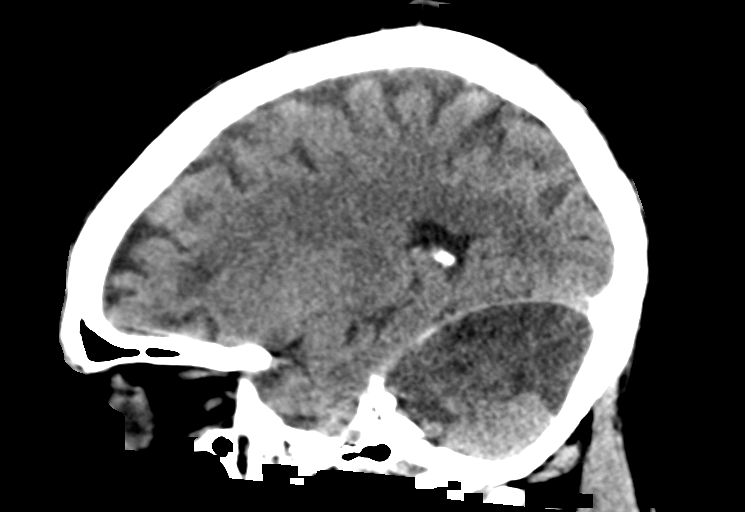

[16 of 47 positions shown; findings below may reference images not displayed]

FINDINGS: Brain: No evidence of acute infarction, hemorrhage, extra-axial
collection, ventriculomegaly, or mass effect. Old large left
cerebellar infarct with encephalomalacia. Old right cerebellar
infarct with encephalomalacia. Left frontoparietal chronic infarct
with encephalomalacia. Old left thalamic lacunar infarct.
Generalized cerebral atrophy. Periventricular white matter low
attenuation likely secondary to microangiopathy.

Vascular: Cerebrovascular atherosclerotic calcifications are noted.

Skull: Negative for fracture or focal lesion.

Sinuses/Orbits: Visualized portions of the orbits are unremarkable.
Visualized portions of the paranasal sinuses and mastoid air cells
are unremarkable.

Other: None.
IMPRESSION: 1. No acute intracranial pathology.

## 2018-05-09 IMAGING — DX DG CHEST 1V PORT
1 series · 1 of 1 positions shown · non-contrast
Comparison: Chest radiograph March 02, 2017 and chest CT
March 02, 2017

CLINICAL DATA: Cough and increasing tracheal secretions

EXAM:
PORTABLE CHEST 1 VIEW

[chest ap]
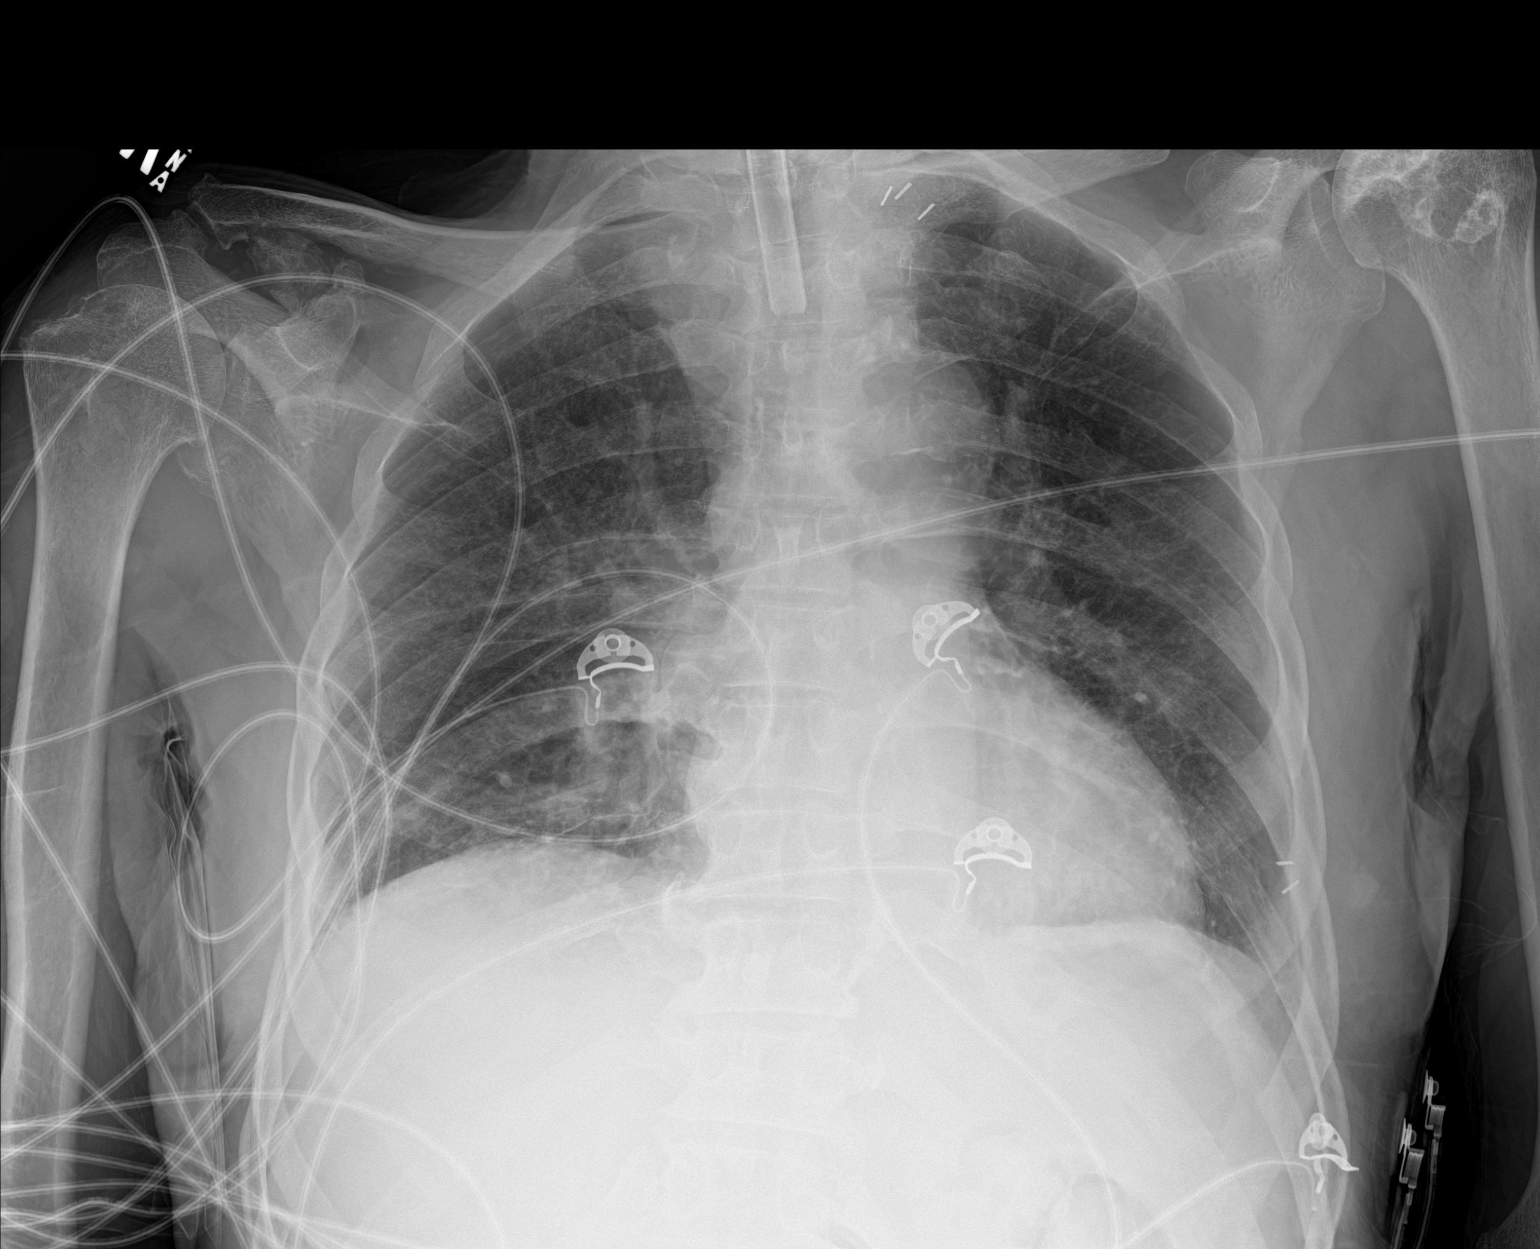

[1 of 1 positions shown; findings below may reference images not displayed]

FINDINGS: Tracheostomy catheter tip is 5.2 cm above the carina. There is
currently no edema or consolidation. Heart is upper normal in size
with pulmonary vascularity within normal limits. No adenopathy.
There is degenerative change in each shoulder. There is evidence of
avascular necrosis in the left humeral head.
IMPRESSION: Tracheostomy as described without pneumothorax. No edema or
consolidation. Cardiac silhouette within normal limits. Avascular
necrosis noted in left humeral head.

## 2018-05-11 IMAGING — DX DG CHEST 1V PORT
1 series · 1 of 1 positions shown · non-contrast
Comparison: 03/30/2017 and earlier.

CLINICAL DATA: 66-year-old male with cough.

EXAM:
PORTABLE CHEST 1 VIEW

[chest]
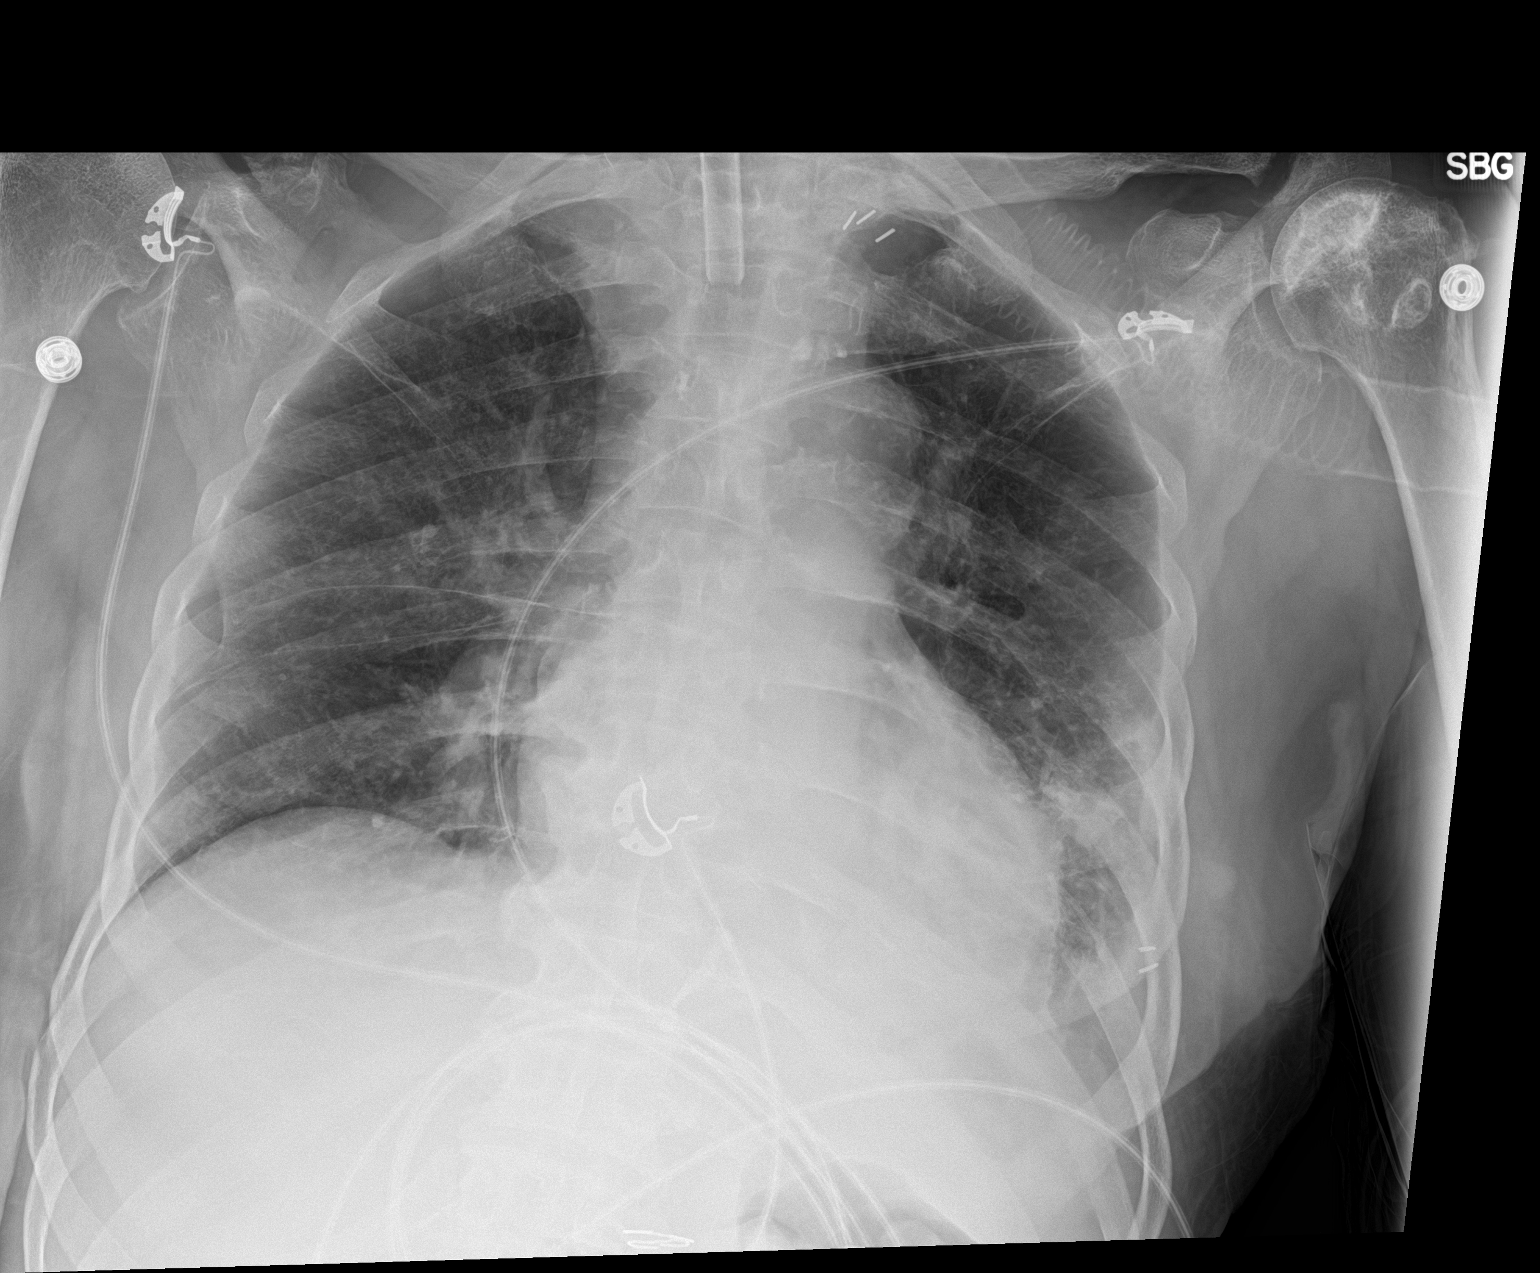

[1 of 1 positions shown; findings below may reference images not displayed]

FINDINGS: Portable AP semi upright view at 8485 hours. Stable tracheostomy
tube. Stable cardiac size and mediastinal contours. Stable lung
volumes with increasing left retrocardiac and lung base opacity now
partially obscuring the left hemidiaphragm. No pneumothorax or
pulmonary edema. Allowing for portable technique the right lung is
clear.
Stable visualized osseous structures. Stable lower left chest wall
surgical clips. Negative visible bowel gas pattern.
IMPRESSION: 1. Increasing left lung base opacity could reflect atelectasis or
pneumonia.
2. The right lung is clear.

## 2018-05-13 IMAGING — DX DG CHEST 1V PORT
1 series · 1 of 1 positions shown · non-contrast
Comparison: Single-view of the chest 04/01/2017 and 03/30/2017.

CLINICAL DATA: Elevated white blood cell count and productive
cough.

EXAM:
PORTABLE CHEST 1 VIEW

[chest ap]
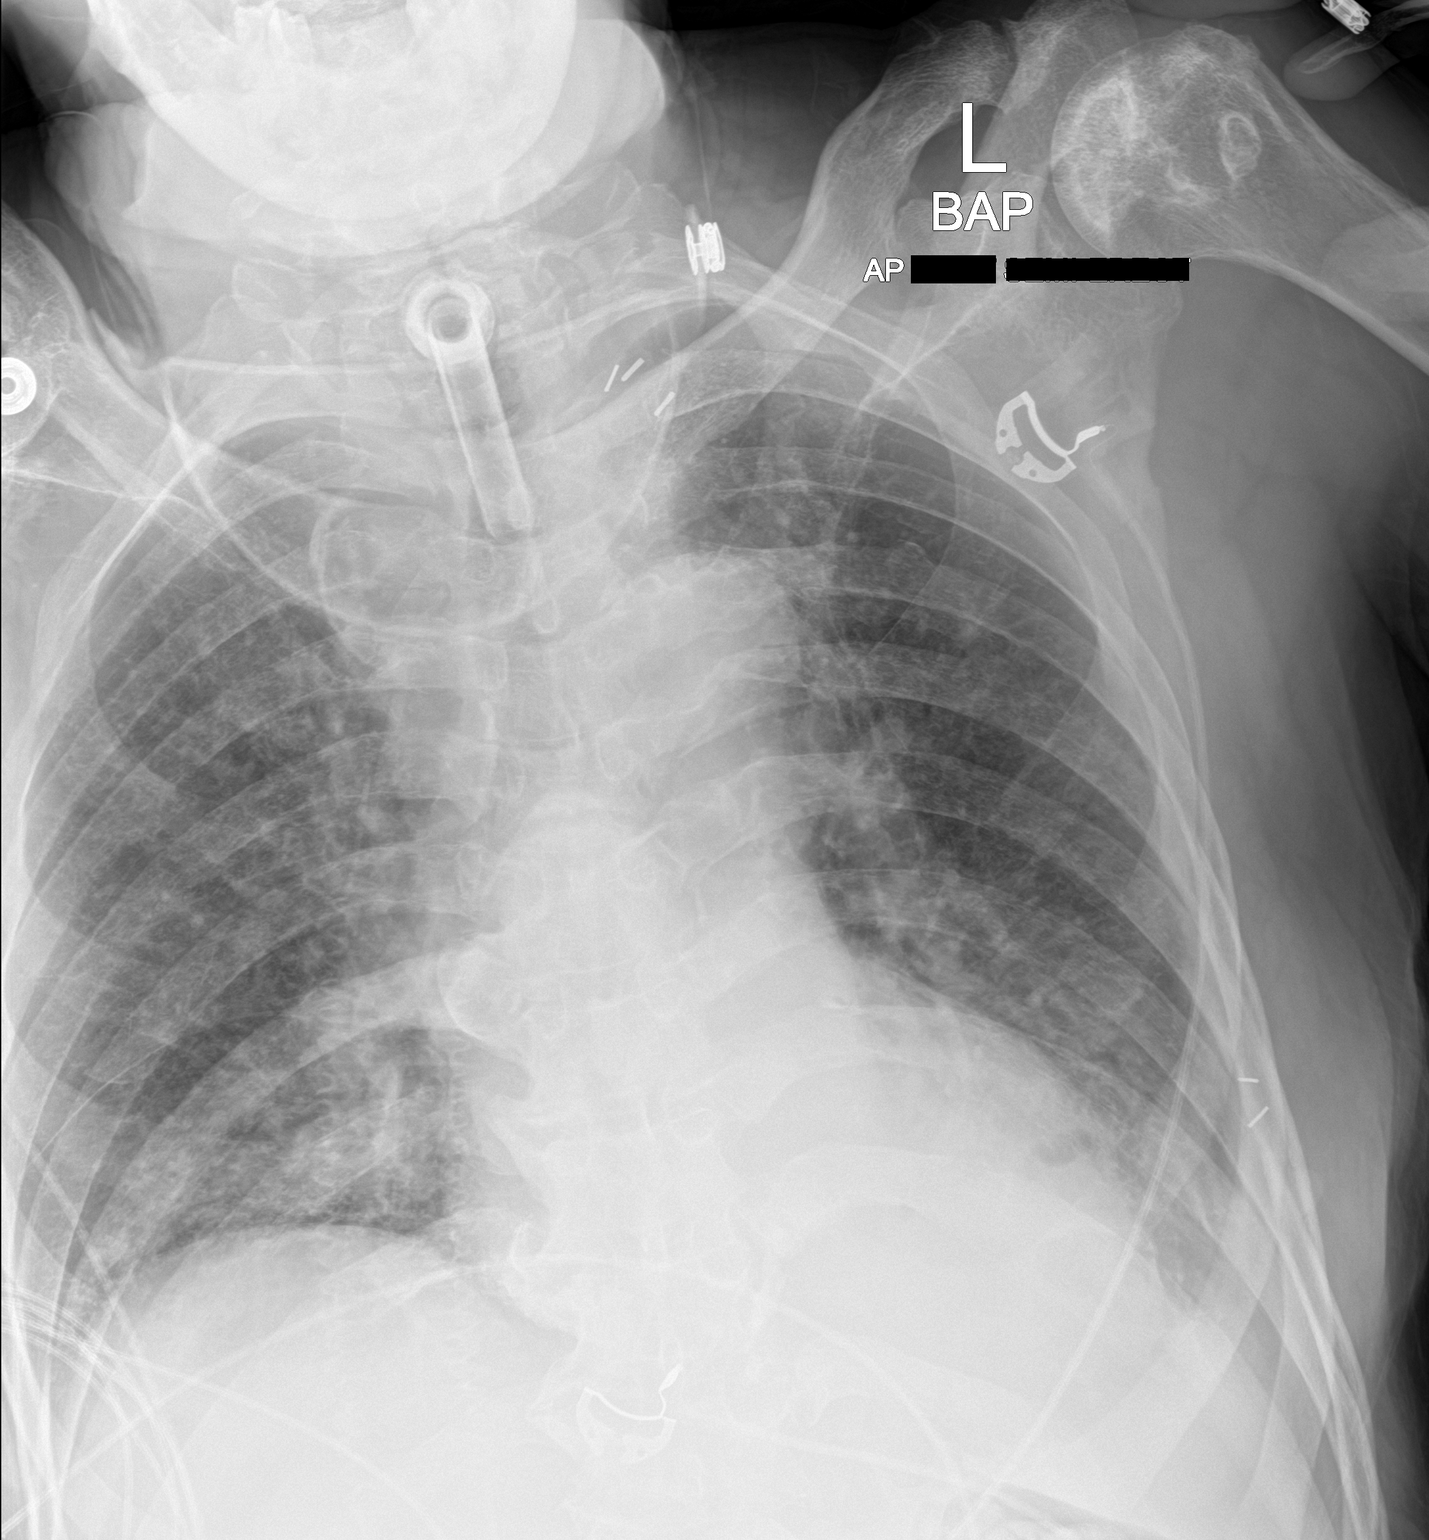

[1 of 1 positions shown; findings below may reference images not displayed]

FINDINGS: Tracheostomy tube is in place. New bibasilar airspace disease is
much worse on the left. No pneumothorax. No pleural effusion. Heart
size is normal. Aortic atherosclerosis is seen. Avascular necrosis
left humeral head is noted.
IMPRESSION: Left much worse than right basilar airspace disease worrisome for
pneumonia is new since the most recent exam.

## 2018-05-19 ENCOUNTER — Emergency Department (HOSPITAL_BASED_OUTPATIENT_CLINIC_OR_DEPARTMENT_OTHER): Payer: Medicare HMO

## 2018-05-19 ENCOUNTER — Emergency Department (HOSPITAL_BASED_OUTPATIENT_CLINIC_OR_DEPARTMENT_OTHER)
Admission: EM | Admit: 2018-05-19 | Discharge: 2018-05-20 | Disposition: A | Discharge status: Home / Self Care | Payer: Medicare HMO | Attending: Emergency Medicine | Admitting: Emergency Medicine

## 2018-05-19 DIAGNOSIS — J9509 Other tracheostomy complication: Secondary | ICD-10-CM | POA: Insufficient documentation

## 2018-05-19 DIAGNOSIS — J9500 Unspecified tracheostomy complication: Secondary | ICD-10-CM

## 2018-05-19 DIAGNOSIS — Z8673 Personal history of transient ischemic attack (TIA), and cerebral infarction without residual deficits: Secondary | ICD-10-CM | POA: Insufficient documentation

## 2018-05-19 DIAGNOSIS — I129 Hypertensive chronic kidney disease with stage 1 through stage 4 chronic kidney disease, or unspecified chronic kidney disease: Secondary | ICD-10-CM | POA: Diagnosis not present

## 2018-05-19 DIAGNOSIS — E1122 Type 2 diabetes mellitus with diabetic chronic kidney disease: Secondary | ICD-10-CM | POA: Insufficient documentation

## 2018-05-19 DIAGNOSIS — Z79899 Other long term (current) drug therapy: Secondary | ICD-10-CM | POA: Insufficient documentation

## 2018-05-19 DIAGNOSIS — Z931 Gastrostomy status: Secondary | ICD-10-CM | POA: Insufficient documentation

## 2018-05-19 DIAGNOSIS — Z7982 Long term (current) use of aspirin: Secondary | ICD-10-CM | POA: Insufficient documentation

## 2018-05-19 DIAGNOSIS — Z960 Presence of urogenital implants: Secondary | ICD-10-CM | POA: Diagnosis not present

## 2018-05-19 DIAGNOSIS — Z430 Encounter for attention to tracheostomy: Secondary | ICD-10-CM | POA: Diagnosis present

## 2018-05-19 DIAGNOSIS — N182 Chronic kidney disease, stage 2 (mild): Secondary | ICD-10-CM | POA: Insufficient documentation

## 2018-05-19 DIAGNOSIS — Z87891 Personal history of nicotine dependence: Secondary | ICD-10-CM | POA: Insufficient documentation

## 2018-05-19 DIAGNOSIS — Z7901 Long term (current) use of anticoagulants: Secondary | ICD-10-CM | POA: Diagnosis not present

## 2018-05-19 NOTE — ED Notes (Signed)
ENT at bedside

## 2018-05-19 NOTE — ED Notes (Signed)
Received patient from [facility] with dislodged trach.  Shiley # 4.  Tracheostomy is scarred and EDP has difficulty reinserting another trach.  Patient has garbled speech d/t history.  He has a foley cath Fr. # 16 and a G-tube that is clamped.    Patient is placed on 5L  and is maintaining an O2 sats of 96%.  Brother in Sports coach at bedside. Patient has been from home.  Patient's sister has been taking care of him at home today since home health nurse is sick per brother in law.

## 2018-05-19 NOTE — ED Notes (Signed)
Brother in law whom the patient resides, cell number if needed [phone].

## 2018-05-19 NOTE — Discharge Instructions (Signed)
Your tracheostomy tube fell out today.  The ENT team was able to replace it.  Please be careful not to let it fall out.  Please follow-up with your primary care physician team.  If any symptoms change or worsen, please return to the nearest emergency department.

## 2018-05-19 NOTE — ED Notes (Signed)
Pt comes via [facility] from [facility] after trach removed for and unknown amount of time, attempted at [facility] to replace trach twice and attempted both size 5 and 6 ett tube without success. Pt on NRB.

## 2018-05-19 NOTE — ED Provider Notes (Signed)
[facility] EMERGENCY DEPARTMENT Provider Note   CSN: [id number] Arrival date & time: 05/19/18  6010     History   Chief Complaint Chief Complaint  Patient presents with  . trach out    HPI Scott Rivera is a 68 y.o. male.  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  Is unclear how long his trach is been out although family says may be a little over an hour.  He was brought in by EMS with oxygen saturations in the 70s.  They had tried putting nonrebreather mask over the trach site.  Reportedly has normal oxygen saturations per EMS are in the upper 80s range.  History is limited due to patient nonverbal and no family at bedside.     Past Medical History:  Diagnosis Date  . Acute pulmonary edema (HCC)   . Anemia due to chronic kidney disease   . ARF (acute renal failure) ([place]) 10/19/2016  . Aspiration pneumonia ([place])   . Cerebellar infarct ([place]) 10/19/2016  . Chronic kidney disease, stage 3 (moderate) (HCC)   . CKD (chronic kidney disease) stage 2, GFR 60-89 ml/min 01/02/2017  . 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 Gastrostomy tube obstruction ([place])   . HCAP (healthcare-associated pneumonia)   . Helicobacter pylori infection 09/21/2012  . History of adenomatous polyp of colon 07/28/2015  . Hypertension   . MDR Acinetobacter baumannii infection   . Pneumothorax, closed, traumatic, initial encounter 12/10/2016  . Renal insufficiency   . Rhabdomyolysis 10/19/2016  . S/P percutaneous endoscopic gastrostomy (PEG) tube placement ([place]) 03/30/2017  . Stage III pressure ulcer of sacral region ([place]) 12/17/2016  . Stroke ([place])   . UTI (urinary tract infection) 03/30/2017    Patient Active Problem List   Diagnosis Date Noted  . Pneumoperitoneum 04/09/2018  . Perforated viscus 04/09/2018  . 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  Aspiration of gastric contents   . Malnutrition of moderate degree 11/19/2017  . Aspiration pneumonia of right lower lobe ([place]) 11/16/2017  . Nausea and vomiting 11/16/2017  . Tracheostomy tube present ([place]) 09/25/2017  . Chronic respiratory insufficiency 07/24/2017  . Hematuria 06/26/2017  . Foley catheter in place on admission 03/30/2017  . Diabetes mellitus ([place]) 03/14/2017  . History of tracheostomy 03/14/2017  . Percutaneous endoscopic gastrostomy status ([place]) 03/14/2017  . Gastroesophageal reflux disease without esophagitis 03/14/2017  . Acute respiratory failure with hypoxia ([place]) 03/02/2017  . Loculated pleural effusion   . CKD (chronic kidney disease) stage 2, GFR 60-89 ml/min 01/02/2017  . Anemia due to chronic kidney disease 12/09/2016  . Compensated metabolic alkalosis 93/23/5573  . Goals of care, counseling/discussion   . Palliative care by specialist   . Aspiration into airway   . Dysphagia, post-stroke   . Tachypnea   . Leukocytosis   . Acute blood loss anemia   . Pressure injury of skin 10/20/2016  . Essential hypertension 10/19/2016  . History of cerebral infarction 10/19/2016  . Bilateral chronic knee pain 01/07/2016  . Tobacco abuse 12/23/2014  . Mixed hyperlipidemia 07/28/2013    Past Surgical History:  Procedure Laterality Date  . IR GASTROSTOMY TUBE MOD SED  10/27/2016  . IR PATIENT EVAL TECH 0-60 MINS  01/05/2017  . IR PATIENT EVAL TECH 0-60 MINS  01/09/2017  . IR PATIENT EVAL TECH 0-60 MINS  03/03/2017  . IR PATIENT  EVAL TECH 0-60 MINS  04/03/2017  . IR REPLACE G-TUBE SIMPLE WO FLUORO  11/20/2017  . IR [place] TUBE PERCUT W/FLUORO  07/23/2017  . KNEE SURGERY    . TRACHEOSTOMY TUBE PLACEMENT          Home Medications    Prior to Admission medications   Medication Sig Start Date End Date Taking? Authorizing Provider  acetaminophen (TYLENOL) 500 MG tablet Place 1,000 mg into feeding tube 3 (three) times daily.     [provider]   Amino Acids-Protein Hydrolys (FEEDING SUPPLEMENT, PRO-STAT SUGAR FREE 64,) LIQD Take 30 mLs by mouth at bedtime.    [provider]  amLODipine (NORVASC) 10 MG tablet Place 1 tablet (10 mg total) into feeding tube daily. 11/23/17   Kayleen Memos, DO  aspirin 81 MG chewable tablet Place 1 tablet (81 mg total) into feeding tube daily. 04/05/17   Nita Sells, MD  atropine 1 % ophthalmic solution Place 2 drops under the tongue 2 (two) times daily. 04/16/18   Aline August, MD  bisacodyl (DULCOLAX) 10 MG suppository Place 1 suppository (10 mg total) rectally daily. 12/22/17   Thurnell Lose, MD  cloNIDine (CATAPRES - DOSED IN MG/24 HR) 0.1 mg/24hr patch Place 1 patch (0.1 mg total) onto the skin every Tuesday. 04/16/18   Aline August, MD  clopidogrel (PLAVIX) 75 MG tablet Place 1 tablet (75 mg total) into feeding tube daily. 01/11/17   Cherene Altes, MD  Ferrous Sulfate (IRON) 325 (65 Fe) MG TABS Take by mouth.    [provider]  gabapentin (NEURONTIN) 100 MG capsule 100 mg See admin instructions. Open 1 capsule (100 mg) and administer contents via feeding tube three times daily    [provider]  ipratropium-albuterol (DUONEB) 0.5-2.5 (3) MG/3ML SOLN Take 3 mLs by nebulization every 4 (four) hours as needed. Patient taking differently: Take 3 mLs by nebulization every 4 (four) hours as needed (shortness of breath/wheezing).  01/10/17   Cherene Altes, MD  metoprolol tartrate (LOPRESSOR) 50 MG tablet Take 1 tablet (50 mg total) by mouth 2 (two) times daily. 12/21/17   Thurnell Lose, MD  montelukast (SINGULAIR) 10 MG tablet Place 10 mg into feeding tube at bedtime.     [provider]  mouth rinse LIQD solution 10 mLs by Mouth Rinse route 4 (four) times daily.    [provider]  Nutritional Supplements (FEEDING SUPPLEMENT, JEVITY 1.2 CAL,) LIQD Place 1,000 mLs into feeding tube continuous. 11/22/17   Kayleen Memos, DO  ondansetron  (ZOFRAN) 4 MG tablet Place 1 tablet (4 mg total) into feeding tube every 6 (six) hours as needed for nausea. 04/16/18   Aline August, MD  pantoprazole sodium (PROTONIX) 40 mg/20 mL PACK Place 20 mLs (40 mg total) into feeding tube 2 (two) times daily. 04/16/18   Aline August, MD  pravastatin (PRAVACHOL) 20 MG tablet Place 1 tablet (20 mg total) into feeding tube daily at 6 PM. Patient taking differently: Place 20 mg daily into feeding tube.  01/10/17   Cherene Altes, MD  senna (SENOKOT) 8.6 MG TABS tablet Take 1 tablet by mouth daily.    [provider]  thiamine 100 MG tablet Place 1 tablet (100 mg total) into feeding tube daily. 01/11/17   Cherene Altes, MD  vitamin C (ASCORBIC ACID) 500 MG tablet Take 500 mg by mouth daily.    [provider]  Water For Irrigation, Sterile (FREE WATER) SOLN Place  100 mLs into feeding tube every 3 (three) hours. 04/16/18   Aline August, MD    Family History Family History  Problem Relation Age of Onset  . Diabetes Mellitus II Brother   . CAD Neg Hx   . Stroke Neg Hx     Social History Social History   Tobacco Use  . Smoking status: Former Research scientist (life sciences)  . Smokeless tobacco: Never Used  . Tobacco comment: Smoked heavily until the day of his stroke  Substance Use Topics  . Alcohol use: Not Currently    Comment: Drank heavily prior to stroke per sister  . Drug use: Not Currently     Allergies   Patient has no known allergies.   Review of Systems Review of Systems  Unable to perform ROS: Patient nonverbal     Physical Exam Updated Vital Signs BP (!) 157/87   Pulse 95   SpO2 (!) 88%   Physical Exam Constitutional:      Appearance: He is well-developed.  HENT:     Head: Normocephalic and atraumatic.  Eyes:     Pupils: Pupils are equal, round, and reactive to light.  Neck:     Musculoskeletal: Normal range of motion and neck supple.     Comments: Patient has a stoma.  There is a trach tube that is lying  across the stoma.  He has a nonrebreather mask over the stoma site. Cardiovascular:     Rate and Rhythm: Normal rate and regular rhythm.     Heart sounds: Normal heart sounds.  Pulmonary:     Effort: Pulmonary effort is normal. No respiratory distress.     Breath sounds: Normal breath sounds. No wheezing or rales.  Chest:     Chest wall: No tenderness.  Abdominal:     General: Bowel sounds are normal.     Palpations: Abdomen is soft.     Tenderness: There is no abdominal tenderness. There is no guarding or rebound.  Musculoskeletal: Normal range of motion.  Lymphadenopathy:     Cervical: No cervical adenopathy.  Skin:    General: Skin is warm and dry.     Findings: No rash.  Neurological:     Mental Status: He is alert.     Comments: Patient is awake and alert.  He is nonverbal.      ED Treatments / Results  Labs (all labs ordered are listed, but only abnormal results are displayed) Labs Reviewed - No data to display  EKG None  Radiology No results found.  Procedures Procedures (including critical care time)  Medications Ordered in ED Medications - No data to display   Initial Impression / Assessment and Plan / ED Course  I have reviewed the triage vital signs and the nursing notes.  Pertinent labs & imaging results that were available during my care of the patient were reviewed by me and considered in my medical decision making (see chart for details).     We were able to get the oxygen saturations into the upper 80s and 90s on a nasal cannula with a trach collar.  I attempted to place a trach tube and was unable to pass it at all into the stoma.  We tried a 6.0 and a 5.0 ET tube and were unable to pass either of these tubes as well.  He is starting to have some bleeding at the stoma site and given that he is maintaining normal oxygen saturations, further attempts were stopped.  I spoke with Dr. Redmond Baseman  with ENT who will see the patient at [facility].  I spoke with  Dr. Sedonia Small, the EDP at [facility] who is excepted the patient for transfer.  He is awake and alert and maintaining normal oxygen saturations at this time so I feel that he is stable for transfer.  CRITICAL CARE Performed by: Malvin Johns Total critical care time: 35 minutes Critical care time was exclusive of separately billable procedures and treating other patients. Critical care was necessary to treat or prevent imminent or life-threatening deterioration. Critical care was time spent personally by me on the following activities: development of treatment plan with patient and/or surrogate as well as nursing, discussions with consultants, evaluation of patient's response to treatment, examination of patient, obtaining history from patient or surrogate, ordering and performing treatments and interventions, ordering and review of laboratory studies, ordering and review of radiographic studies, pulse oximetry and re-evaluation of patient's condition.   Final Clinical Impressions(s) / ED Diagnoses   Final diagnoses:  Complication of tracheostomy tube [facility])    ED Discharge Orders    None       Malvin Johns, MD 05/19/18 2023

## 2018-05-19 NOTE — ED Notes (Signed)
[facility] did not have trucks available.  Called EMS for transport.  In the meantime, [facility] was able to send a truck.  Cancelled call to EMS

## 2018-05-19 NOTE — Consult Note (Signed)
Reason for Consult: Trach tube out Referring Physician: ER  Scott Rivera is an 68 y.o. male.  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  He was stable with supplemental oxygen.  Past Medical History:  Diagnosis Date  . Acute pulmonary edema (HCC)   . Anemia due to chronic kidney disease   . ARF (acute renal failure) ([place]) 10/19/2016  . Aspiration pneumonia ([place])   . Cerebellar infarct ([place]) 10/19/2016  . Chronic kidney disease, stage 3 (moderate) (HCC)   . CKD (chronic kidney disease) stage 2, GFR 60-89 ml/min 01/02/2017  . 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 Gastrostomy tube obstruction ([place])   . HCAP (healthcare-associated pneumonia)   . Helicobacter pylori infection 09/21/2012  . History of adenomatous polyp of colon 07/28/2015  . Hypertension   . MDR Acinetobacter baumannii infection   . Pneumothorax, closed, traumatic, initial encounter 12/10/2016  . Renal insufficiency   . Rhabdomyolysis 10/19/2016  . S/P percutaneous endoscopic gastrostomy (PEG) tube placement ([place]) 03/30/2017  . Stage III pressure ulcer of sacral region ([place]) 12/17/2016  . Stroke ([place])   . UTI (urinary tract infection) 03/30/2017    Past Surgical History:  Procedure Laterality Date  . IR GASTROSTOMY TUBE MOD SED  10/27/2016  . IR PATIENT EVAL TECH 0-60 MINS  01/05/2017  . IR PATIENT EVAL TECH 0-60 MINS  01/09/2017  . IR PATIENT EVAL TECH 0-60 MINS  03/03/2017  . IR PATIENT EVAL TECH 0-60 MINS  04/03/2017  . IR REPLACE G-TUBE SIMPLE WO FLUORO  11/20/2017  . IR [place] TUBE PERCUT W/FLUORO  07/23/2017  . KNEE SURGERY    . TRACHEOSTOMY TUBE PLACEMENT      Family History  Problem Relation Age of Onset  . Diabetes Mellitus II Brother   . CAD Neg Hx   . Stroke Neg Hx     Social History:  reports that he has quit smoking. He has never used  smokeless tobacco. He reports previous alcohol use. He reports previous drug use.  Allergies: No Known Allergies  Medications: I have reviewed the patient's current medications.  No results found for this or any previous visit (from the past 48 hour(s)).  Dg Chest Port 1 View  Result Date: 05/19/2018 CLINICAL DATA:  Dislodged tracheostomy tube EXAM: PORTABLE CHEST 1 VIEW COMPARISON:  04/15/2018 FINDINGS: No tracheostomy tube is present. Bilateral chronic interstitial thickening right greater than left. More focal bilateral lower lobe hazy airspace disease which may reflect atelectasis versus pneumonia. No pleural effusion or pneumothorax. Stable cardiomediastinal silhouette. No aggressive osseous lesion. Mild osteoarthritis of the right glenohumeral joint. Moderate arthropathy of the right acromioclavicular joint. IMPRESSION: No tracheostomy tube is present. Bilateral chronic interstitial thickening right greater than left. More focal bilateral lower lobe hazy airspace disease which may reflect atelectasis versus pneumonia. Electronically Signed   By: Kathreen Devoid   On: 05/19/2018 20:30    Review of Systems  Unable to perform ROS: Patient nonverbal   Blood pressure (!) 135/94, pulse 95, temperature 97.9 F (36.6 C), temperature source Oral, resp. rate 17, SpO2 100 %. Physical Exam  Constitutional: He appears well-developed and well-nourished. No distress.  HENT:  Head: Normocephalic and atraumatic.  Right Ear: External ear normal.  Left Ear: External ear normal.  Nose: Nose normal.  Mouth/Throat: Oropharynx is clear and moist.  Eyes: Pupils  are equal, round, and reactive to light. Conjunctivae and EOM are normal.  Neck: Normal range of motion. Neck supple.  Trach site with narrowed fistula, no air movement.  Cardiovascular: Normal rate.  Respiratory: Effort normal.  Neurological: He is alert. No cranial nerve deficit.  Skin: Skin is warm and dry.    Assessment/Plan: Tracheostomy  status, dysphagia  An uncuffed #6 Shiley trach was replaced.  See the procedure note.  He can return to his facility.  Scott Rivera 05/19/2018, 9:58 PM

## 2018-05-19 NOTE — ED Notes (Signed)
Called [facility] ed. Gave report to Richarda Blade, Agricultural consultant

## 2018-05-19 NOTE — Procedures (Signed)
Preop diagnosis: Tracheostomy dependence, dysphagia Postop diagnosis: same Procedure: Dilation of tracheocutaneous fistula for replacement of trach tube Surgeon: Redmond Baseman Anesth: None Compl: None Findings: Narrowed tracheocutaneous fistula. Description:  The patient was in a reclined position.  The tracheocutaneous fistula was serially dilated with urethral sounds coated in Surgi-lube.  An uncuffed #6 Shiley trach tube was able to be placed.  A Velcro trach tie was added and he was returned to nursing care in stable condition.

## 2018-05-19 NOTE — ED Notes (Signed)
ENT provider cart at bedside

## 2018-05-19 NOTE — ED Triage Notes (Signed)
Pt BIB [facility] for trach that was pulled out. Per EMS patient pulled trach out and has been pulling at his tubes. Per report pts normal O2 sat of 85 -88. Per EMS his O2 wasn't above 80. Would not allow them to get any other vital

## 2018-05-19 NOTE — ED Provider Notes (Signed)
9:15 PM With a past medical history of hypertension, prior stroke, diabetes, CKD, hyperlipidemia, G-tube and tracheostomy dependence who presents as a transfer from [place] for further evaluation and management of tracheostomy dislodgment.  Patient was found to have his tracheostomy removed for an unknown amount of time today.  Patient was having hypoxia and is now on oxygen supplementation.  Patient had several attempts performed at [facility] without success of replacement.  Dr. Redmond Baseman with otolaryngology was called who will see patient after transfer.  Dr. Redmond Baseman was called who will come see patient in the ED.  Patient resting comfortably on oxygen supplementation.  On my exam, lungs are clear and chest is nontender.  Abdomen is nontender.  Minimal bleeding from tracheostomy currently.  Otolaryngology came to the bedside and replaced the trach without difficulty after dilation.  They feel he is safe for discharge home.  I assessed the patient and he was breathing with his tracheostomy doing well.  Patient will be discharged for further outpatient management.     Clinical Impression: 1. Complication of tracheostomy tube [facility])     Disposition: Discharge  Condition: Good  I have discussed the results, Dx and Tx plan with the pt(& family if present). He/she/they expressed understanding and agree(s) with the plan. Discharge instructions discussed at great length. Strict return precautions discussed and pt &/or family have verbalized understanding of the instructions. No further questions at time of discharge.    New Prescriptions   No medications on file    Follow Up: [place] [address] [place] [address] [id number] mc [place] Torres         Gift Rueckert, Gwenyth Allegra, MD 05/20/18 [phone]

## 2018-05-20 NOTE — ED Notes (Signed)
[facility] unable to transport d/t status as trach dependent and they are BLS unit only. Santiago Glad, CN aware.

## 2018-05-20 NOTE — ED Notes (Signed)
Reviewed d/c instructions with pt, who verbalized understanding and had no outstanding questions. Pt departed in NAD, and in care of [facility].

## 2018-06-26 ENCOUNTER — Encounter: Payer: Self-pay | Admitting: Internal Medicine

## 2018-07-30 IMAGING — CT CT ABD-PELV W/ CM
2 of 5 series · 15 of 46 positions shown, 17 images · IV contrast (APPLIED)
Comparison: 10/26/2016 CT abdomen and pelvis.

CLINICAL DATA: 67 y/o M; history of stroke with Foley, G-tube, and
trach. Patient pole Foley and there is drainage around the G-tube.

EXAM:
CT ABDOMEN AND PELVIS WITH CONTRAST
TECHNIQUE: Multidetector CT imaging of the abdomen and pelvis was performed
using the standard protocol following bolus administration of
intravenous contrast.
CONTRAST:  100mL [id number] IOPAMIDOL (GY7AQJ-XDD) INJECTION 61%

[Series 2: axial st · axial · 0.75mm/px · z∈[-594,-139]mm · 12 of 103 slices shown, 14 images]
[im 6/103  soft-tissue]
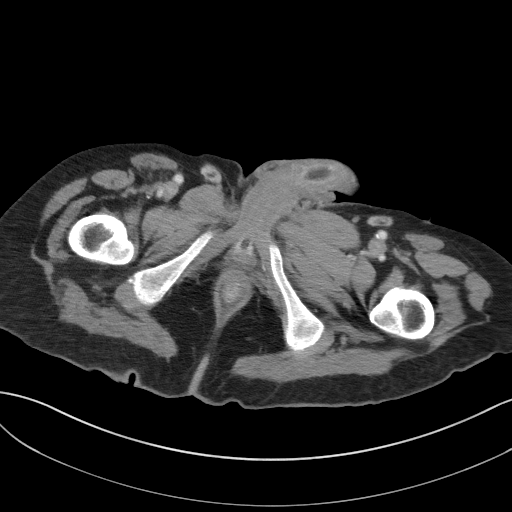
[im 6/103  bone]
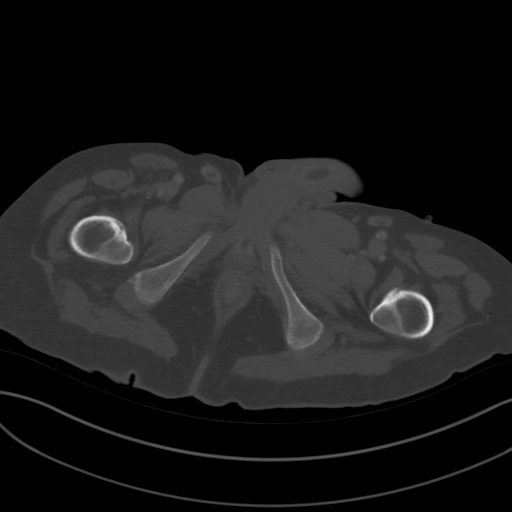
[im 17/103  soft-tissue]
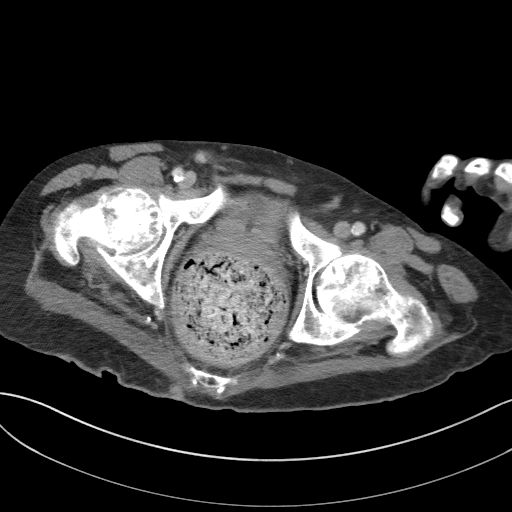
[im 22/103  soft-tissue]
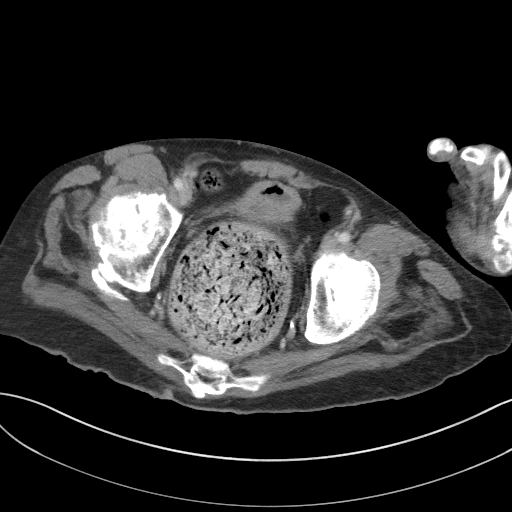
[im 33/103  soft-tissue]
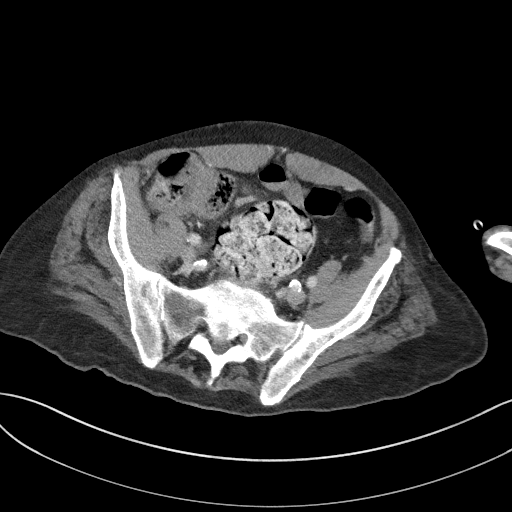
[im 38/103  soft-tissue]
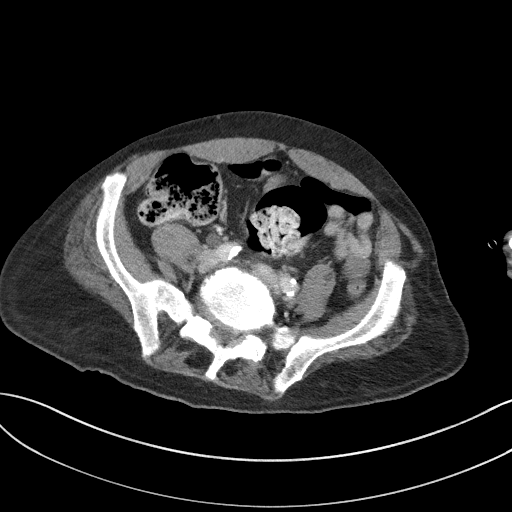
[im 49/103  soft-tissue]
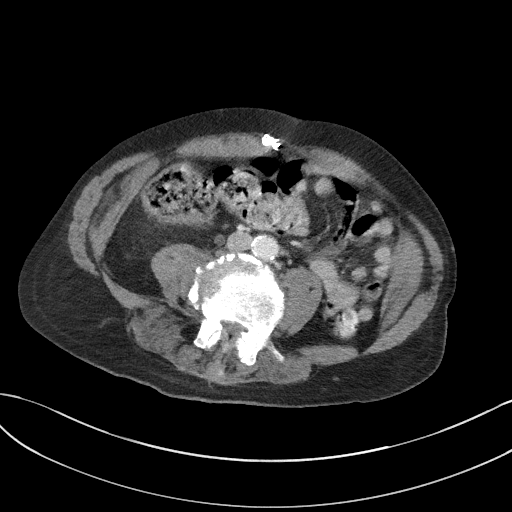
[im 54/103  soft-tissue]
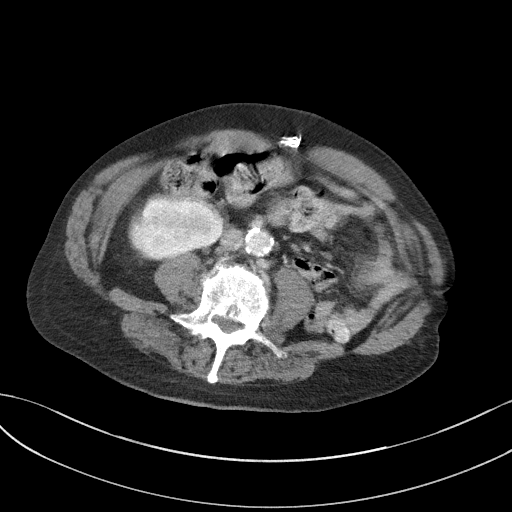
[im 65/103  soft-tissue]
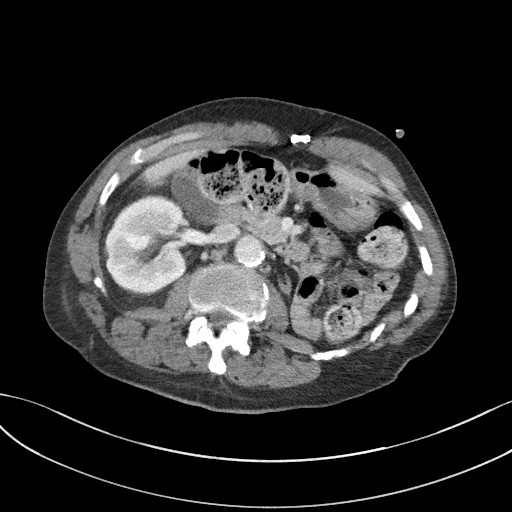
[im 70/103  soft-tissue]
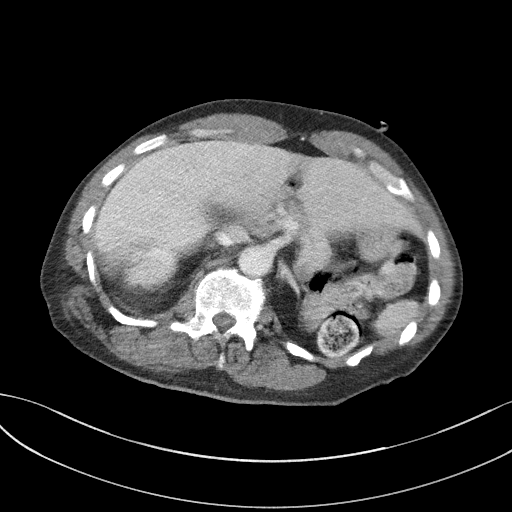
[im 70/103  bone]
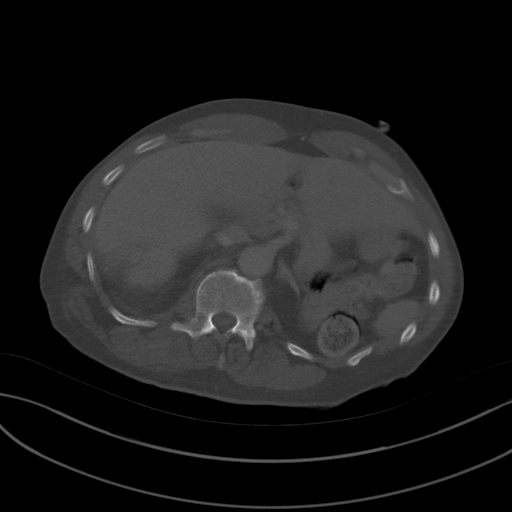
[im 81/103  soft-tissue]
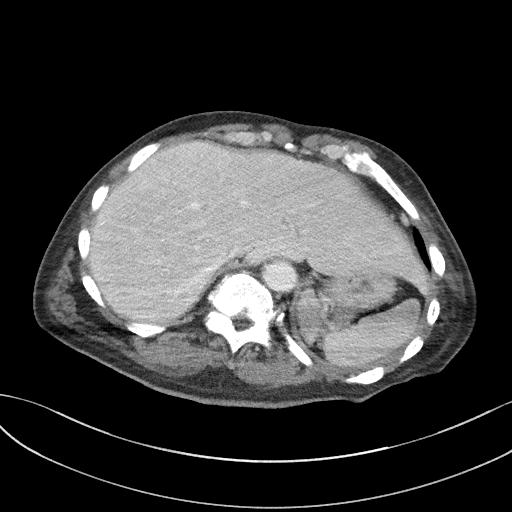
[im 86/103  soft-tissue]
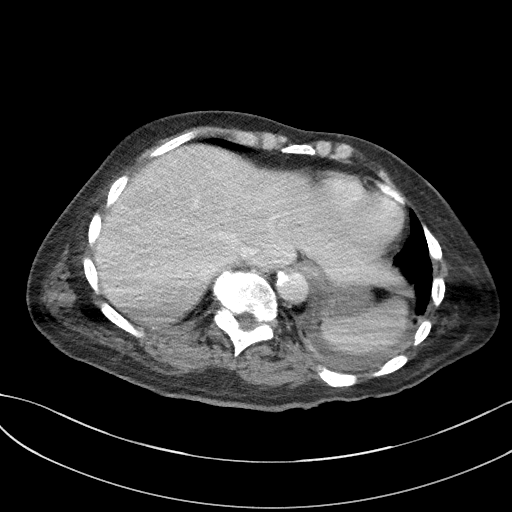
[im 97/103  soft-tissue]
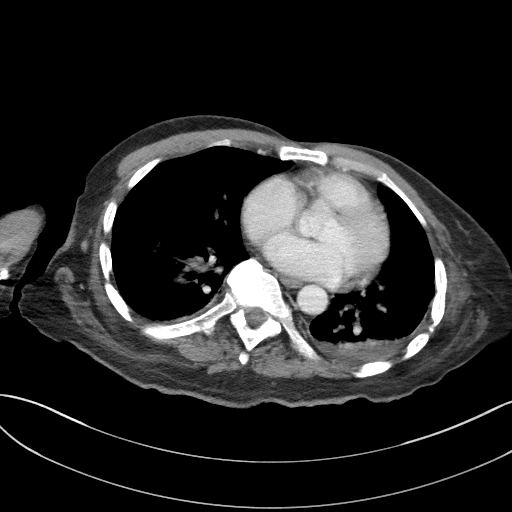

[Series 5: coronal st · coronal · 0.87mm/px · 3 of 99 slices shown]
[im 33/99  soft-tissue]
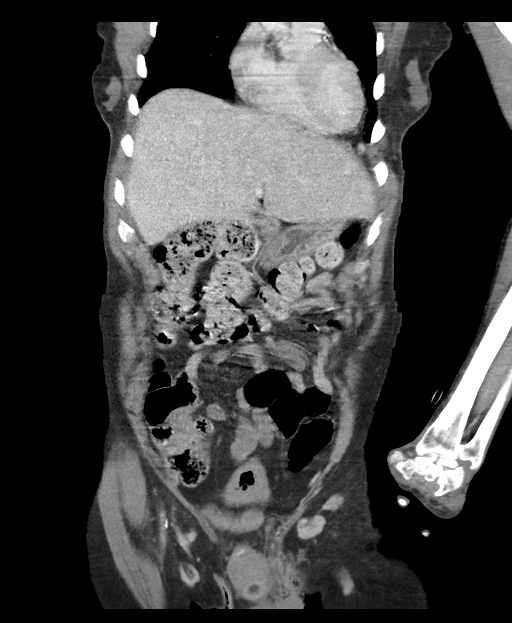
[im 44/99  soft-tissue]
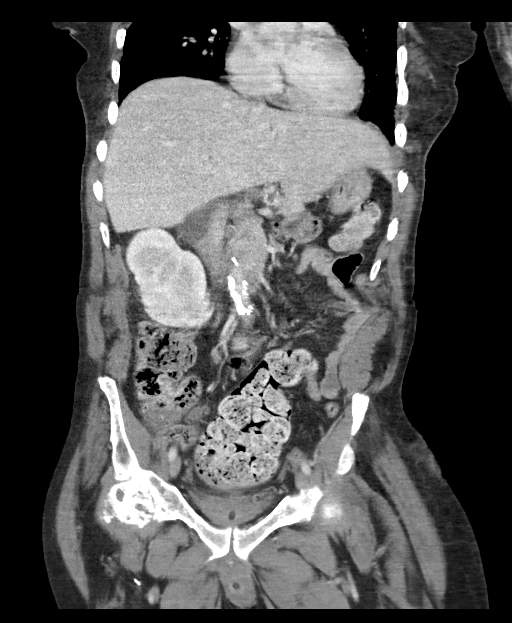
[im 55/99  soft-tissue]
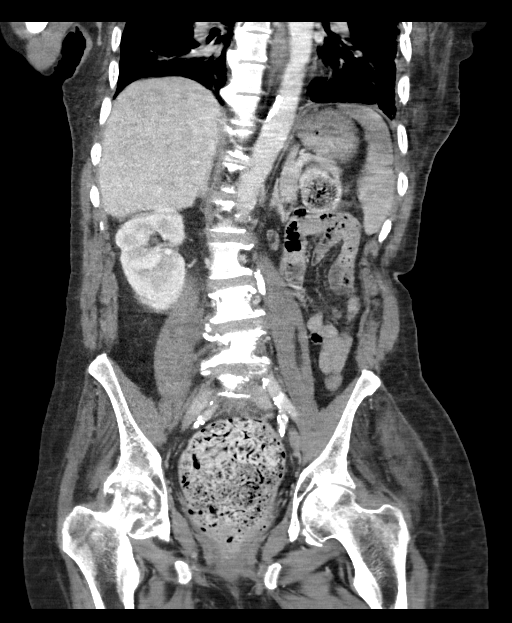

[15 of 46 positions shown; findings below may reference images not displayed]

FINDINGS: Lower chest: Small left pleural effusion. Coronary artery
calcifications.

Hepatobiliary: Subcentimeter lucency in liver segment 4B, probably
cysts. Granuloma in left lobe of liver. Normal gallbladder. No
biliary ductal dilatation.

Pancreas: Unremarkable. No pancreatic ductal dilatation or
surrounding inflammatory changes.

Spleen: Normal in size without focal abnormality.

Adrenals/Urinary Tract: Atrophic left kidney. Normal adrenal glands.
No focal right kidney lesion. Mild motion artifact through the right
kidney. Mild right hydronephrosis, no obstructing stone or mass
identified. Ureter enlargement may be due to large volume of stool
in the rectum with mass effect and pelvis. Foley catheter balloon
within a decompressed bladder.

Stomach/Bowel: Gastrostomy tube within gastric body. No fluid
collection within the anterior abdominal wall along the course of
gastrostomy tube. Large volume of stool throughout colon with
significant stool in rectum. No obstructive or inflammatory changes
of the bowel.

Vascular/Lymphatic: Aortic atherosclerosis. No enlarged abdominal or
pelvic lymph nodes.

Reproductive: Prostate is unremarkable.

Other: Midline postsurgical changes in the anterior abdominal wall
are unchanged. No ascites.

Musculoskeletal: Mild S-shaped curvature of the lumbar spine and
multilevel disc greater than facet degenerative changes. Severe
degenerative changes of the right-greater-than-left hip joints.
Avascular necrosis of femoral heads without collapse. No acute
fracture identified.
IMPRESSION: 1. Gastrostomy balloon within gastric body. No fluid collection in
anterior abdominal wall along course of gastrostomy tube.
2. Foley catheter balloon within the bladder.
3. Small left pleural effusion.
4. Large volume of stool throughout colon with significant stool in
rectum may represent constipation.
5. Mild right hydronephrosis, no obstructing stone identified.
Findings may be due to mass effect from large rectal stool burden.
6. Aortic atherosclerosis.

By: Aliza Olea M.D.

## 2018-07-30 IMAGING — DX DG CHEST 2V
2 series · 2 of 2 positions shown · non-contrast
Comparison: 04/03/2017

CLINICAL DATA: Gray phlegm from G-tube. Aspiration pneumonia,
infarct, hypertension, former smoker.

EXAM:
CHEST - 2 VIEW

[chest lat]
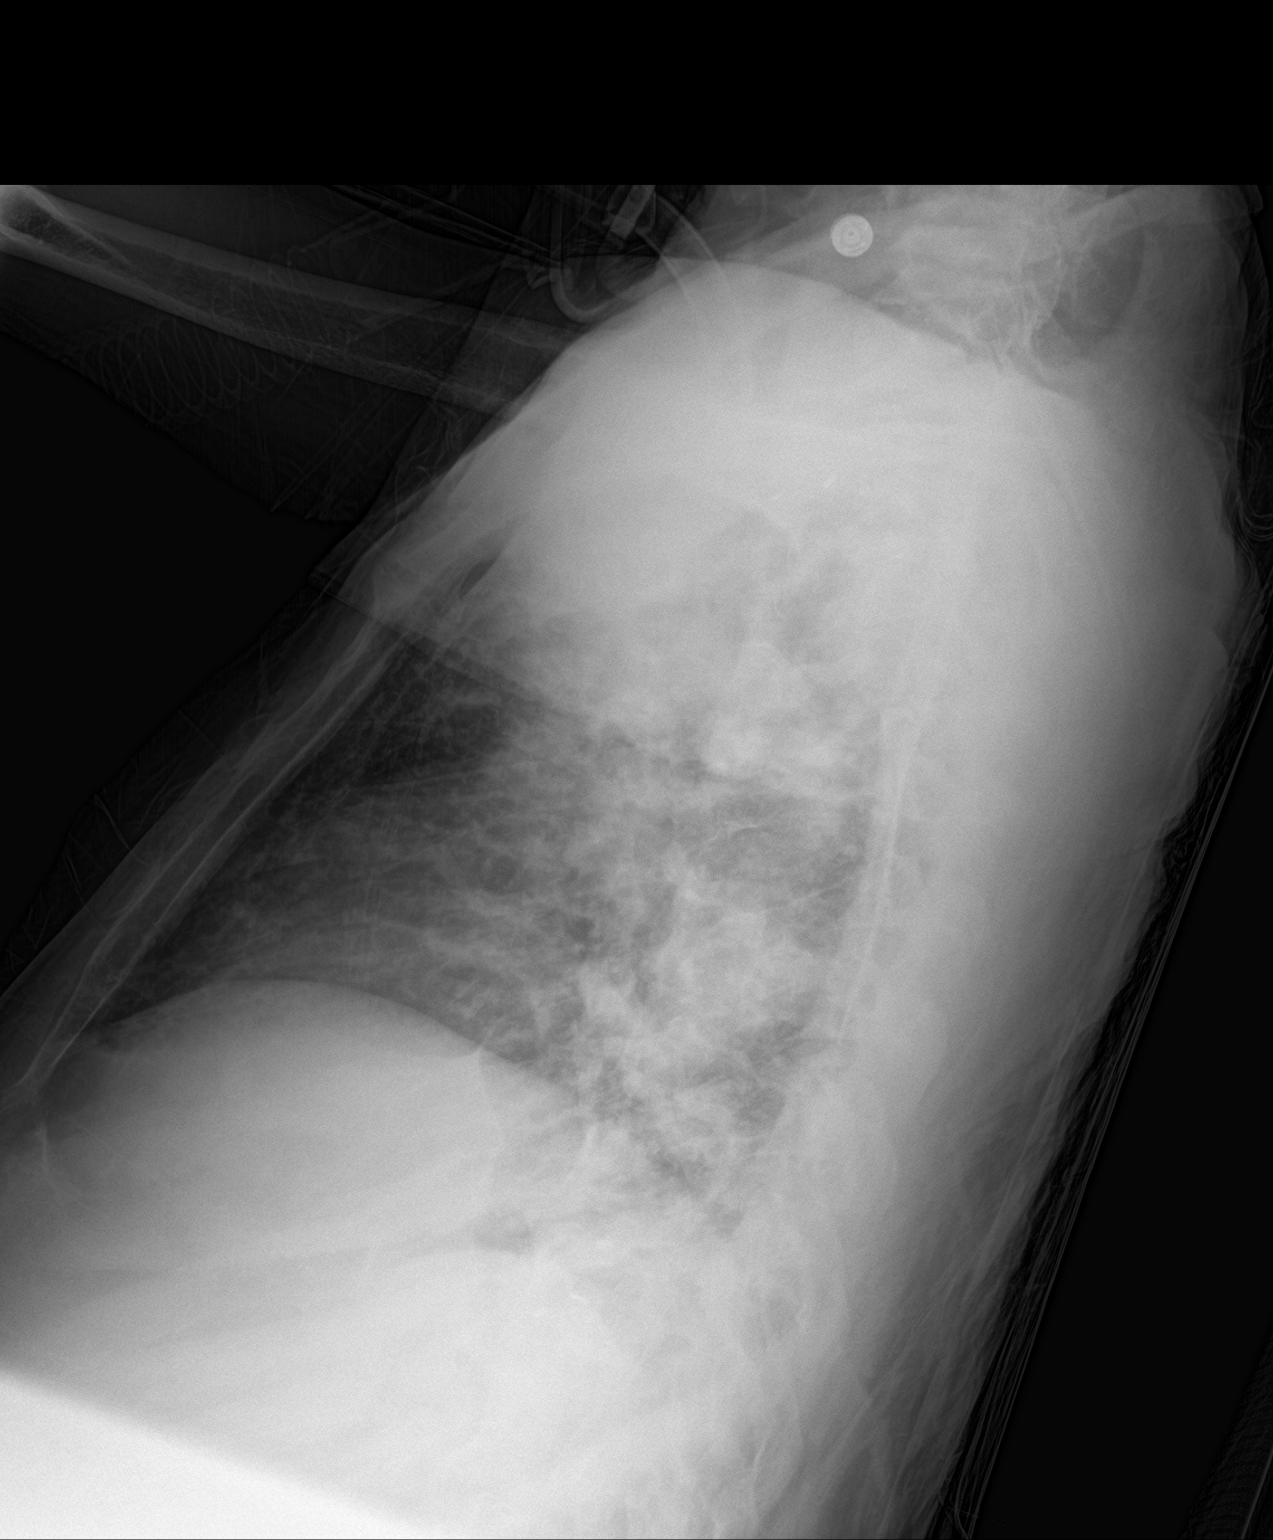

[chest ap]
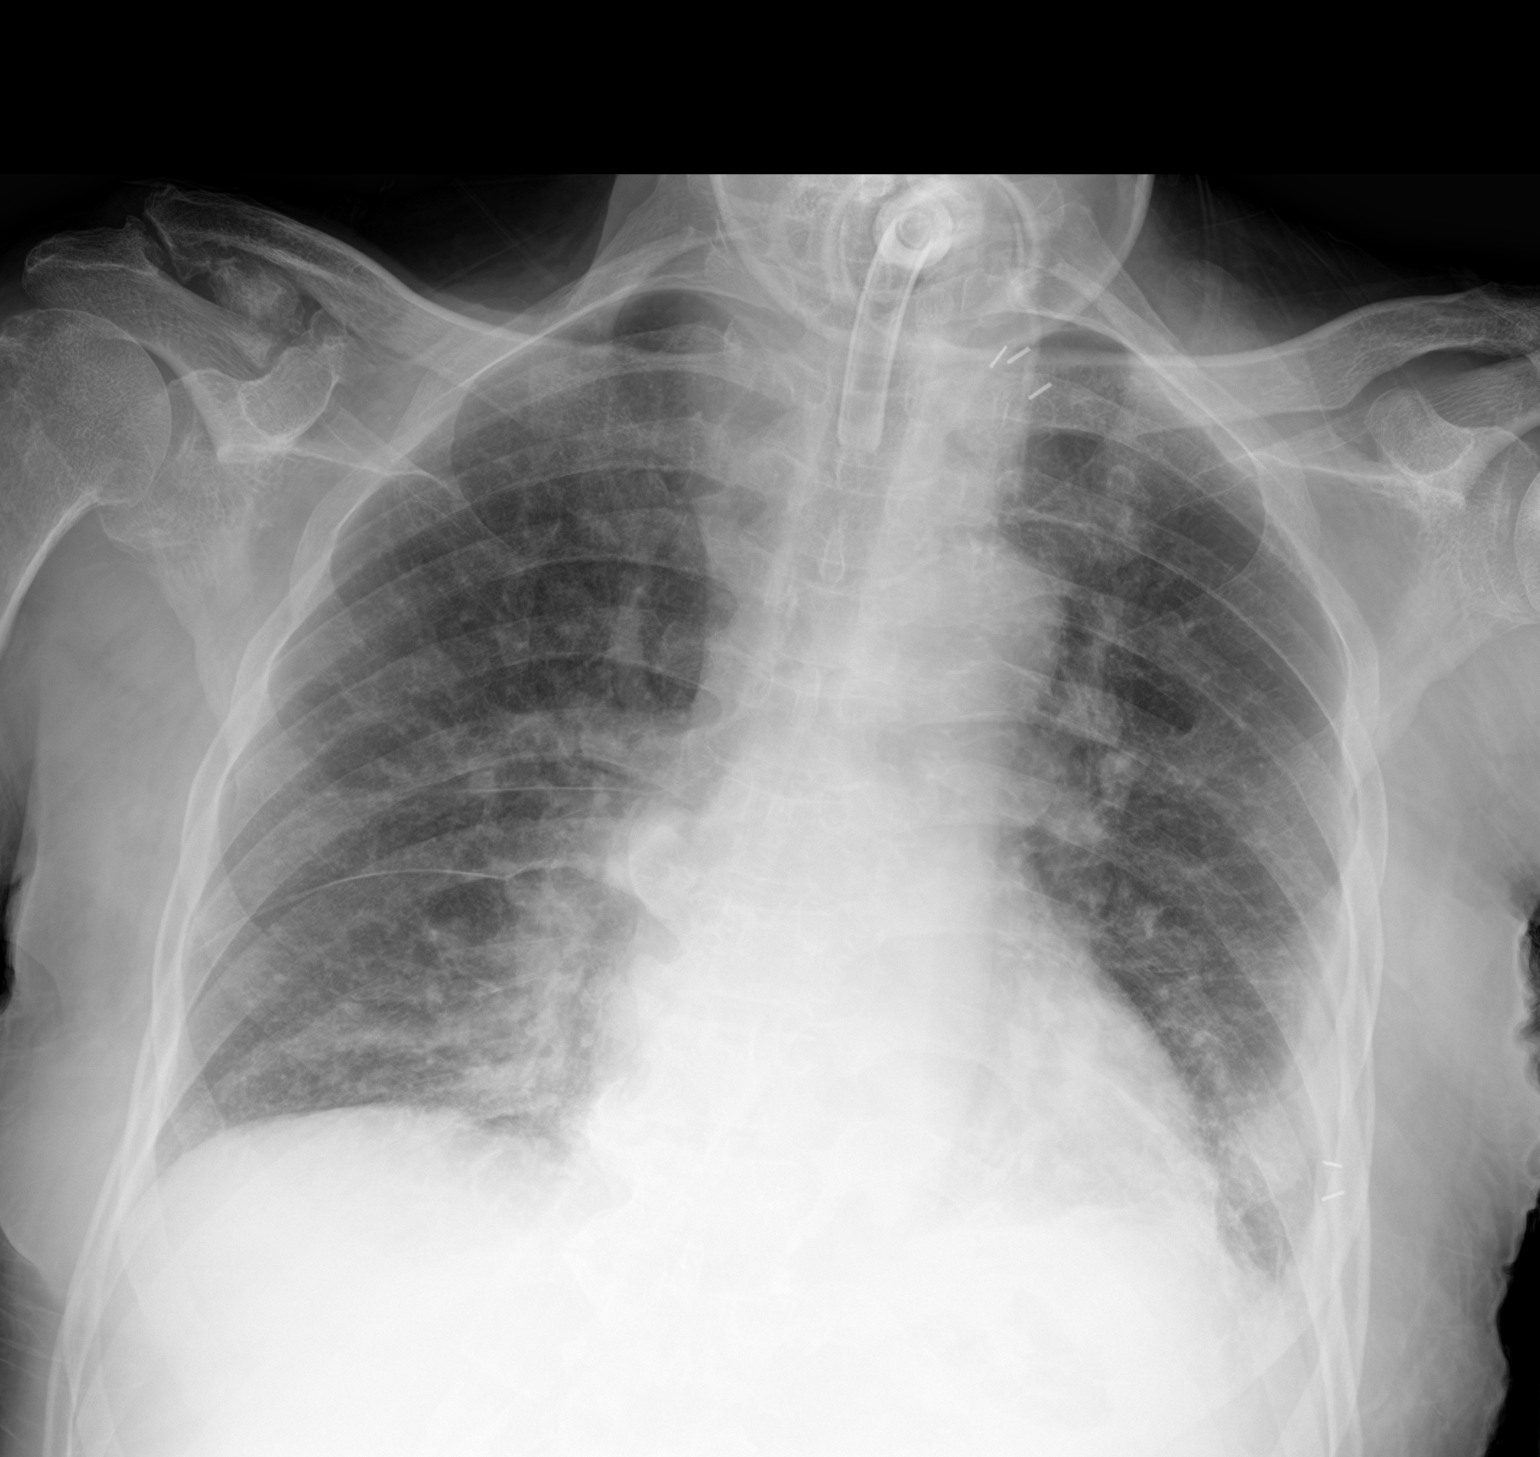

[2 of 2 positions shown; findings below may reference images not displayed]

FINDINGS: Heart is top-normal in size. There is mild aortic atherosclerosis at
the arch. Redemonstration of bibasilar airspace opacities likely
representing areas of atelectasis. Small foci of pneumonia are not
excluded especially at the right lung base given its slightly more
confluent appearance. Trace left effusion blunting the left
costophrenic angle. Two surgical clips project over the left lower
chest wall and 3 overlying the left lung apex. Tracheostomy tube tip
terminates in the superior mediastinum centered over the trachea. No
acute osseous abnormality. Right coracoclavicular ligament
ossification is seen.
IMPRESSION: 1. Redemonstration of bibasilar airspace opacities, more likely
representing atelectasis. However given the slightly more confluent
appearance at the right lung base medially, findings could
potentially represent stigmata of aspiration pneumonia.
2. Minimal aortic atherosclerosis.

## 2018-08-25 IMAGING — DX DG CHEST 1V PORT
1 series · 1 of 1 positions shown · non-contrast
Comparison: Chest x-ray dated June 26, 2017.

CLINICAL DATA: Lethargy.  History of aspiration pneumonia.

EXAM:
PORTABLE CHEST 1 VIEW

[chest ap]
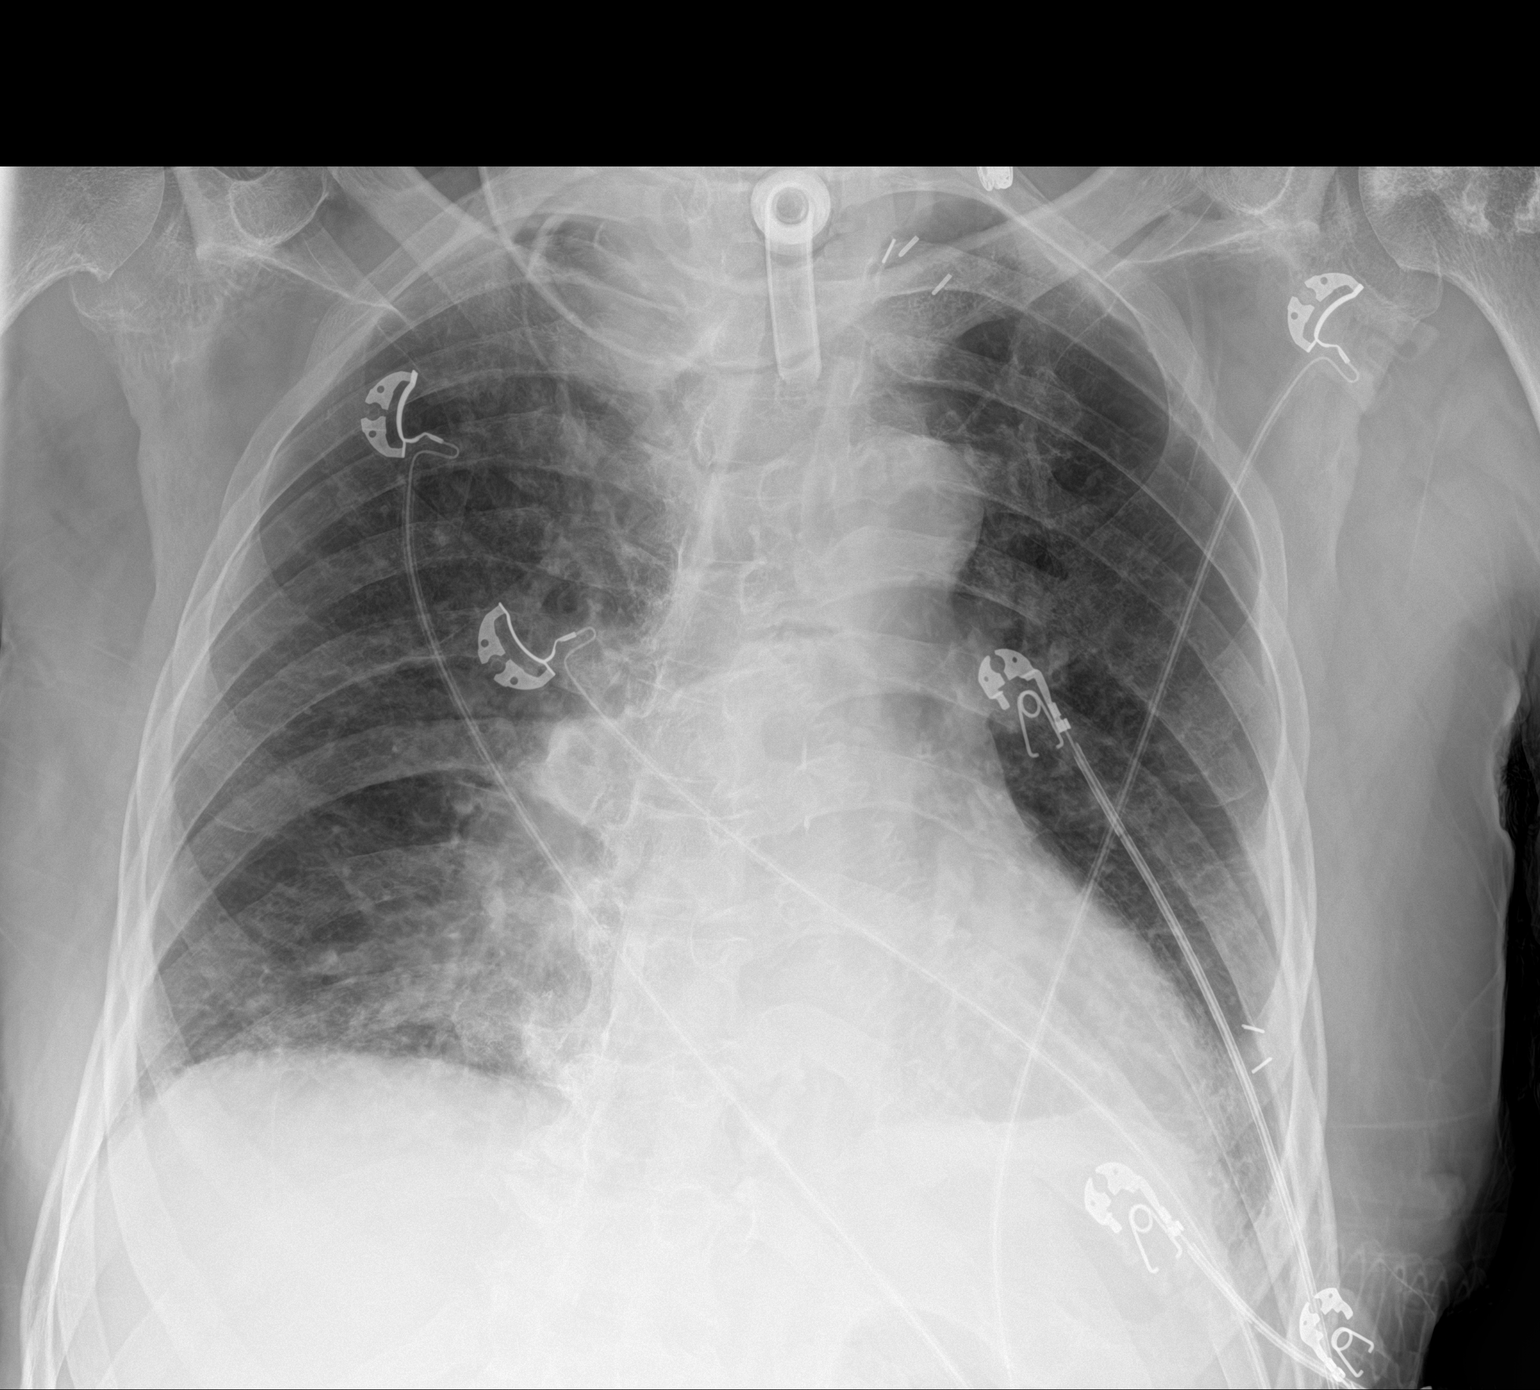

[1 of 1 positions shown; findings below may reference images not displayed]

FINDINGS: Unchanged tracheostomy tube. Stable cardiomegaly. Normal pulmonary
vascularity. Patchy airspace disease at both lung bases appears
slightly improved. No focal consolidation, pleural effusion, or
pneumothorax. Unchanged avascular necrosis of the left humeral head.
IMPRESSION: 1. Improved patchy airspace disease at the lung bases.
2. Unchanged avascular necrosis of the left humeral head.

## 2018-09-01 IMAGING — XA IR REPLACE G-TUBE/COLONIC TUBE
2 series · 5 of 5 positions shown · non-contrast
Comparison: none

INDICATION: Bleeding and purulent discharge around the exit site of an
indwelling bumper retention percutaneous gastrostomy tube initially
placed on 10/27/2016. There also has been difficulty using the tube
for feeding purposes.

[Series 1: fl (-) angio · 4 of 4 slices shown]
[im 1/4]
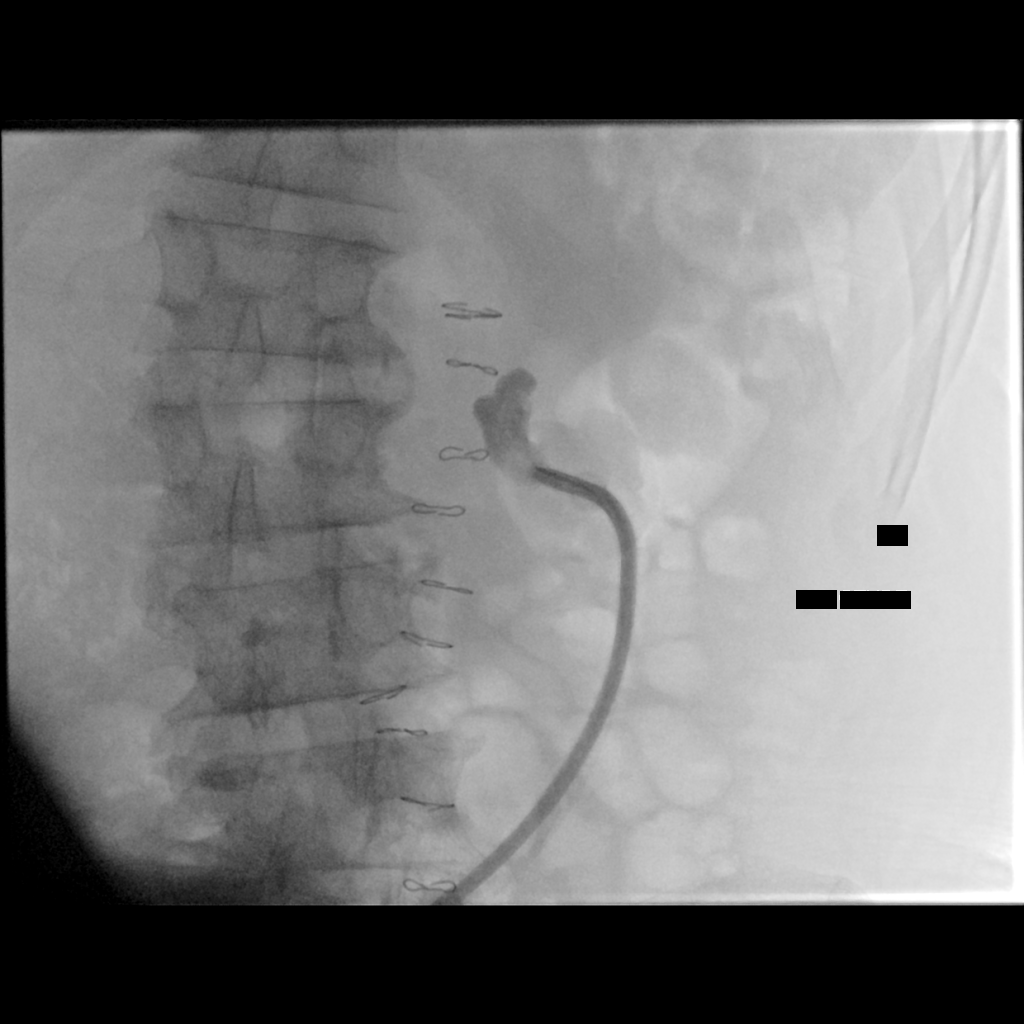
[im 2/4]
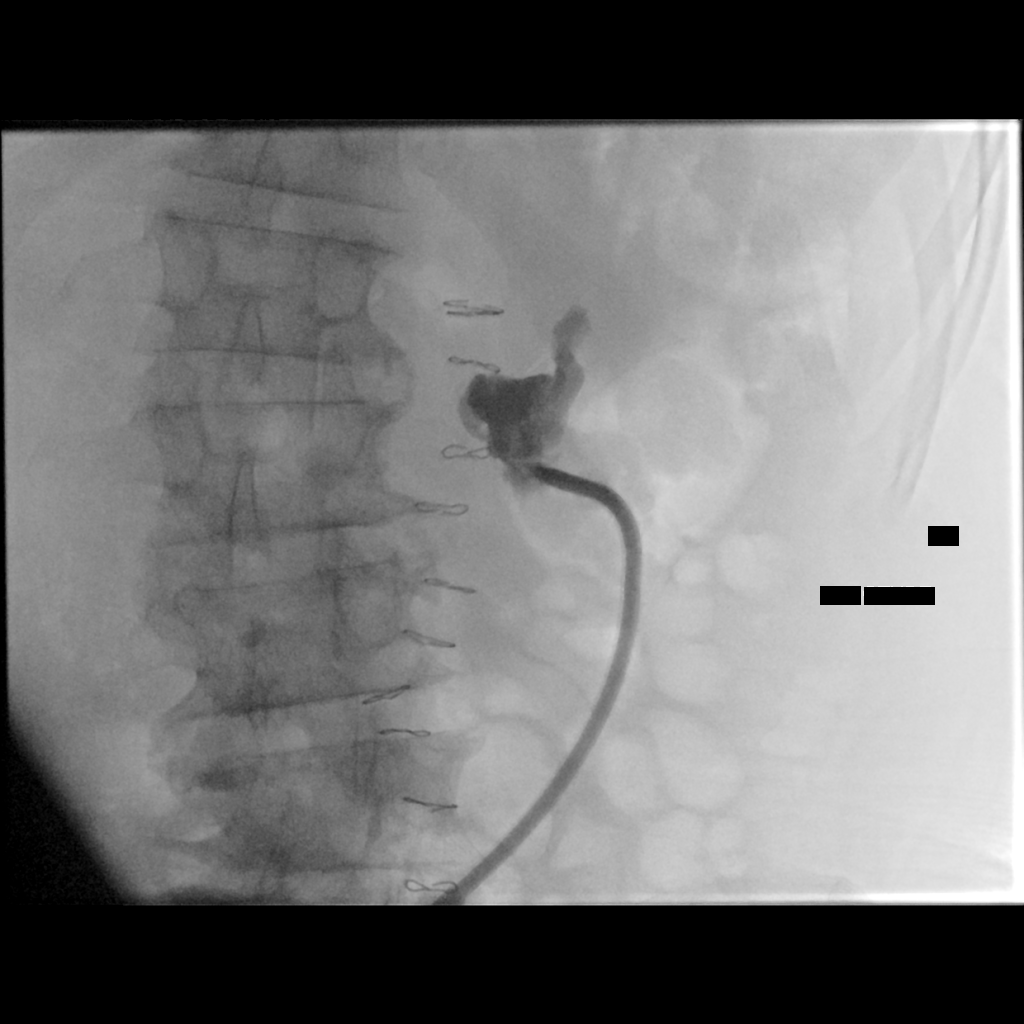
[im 3/4]
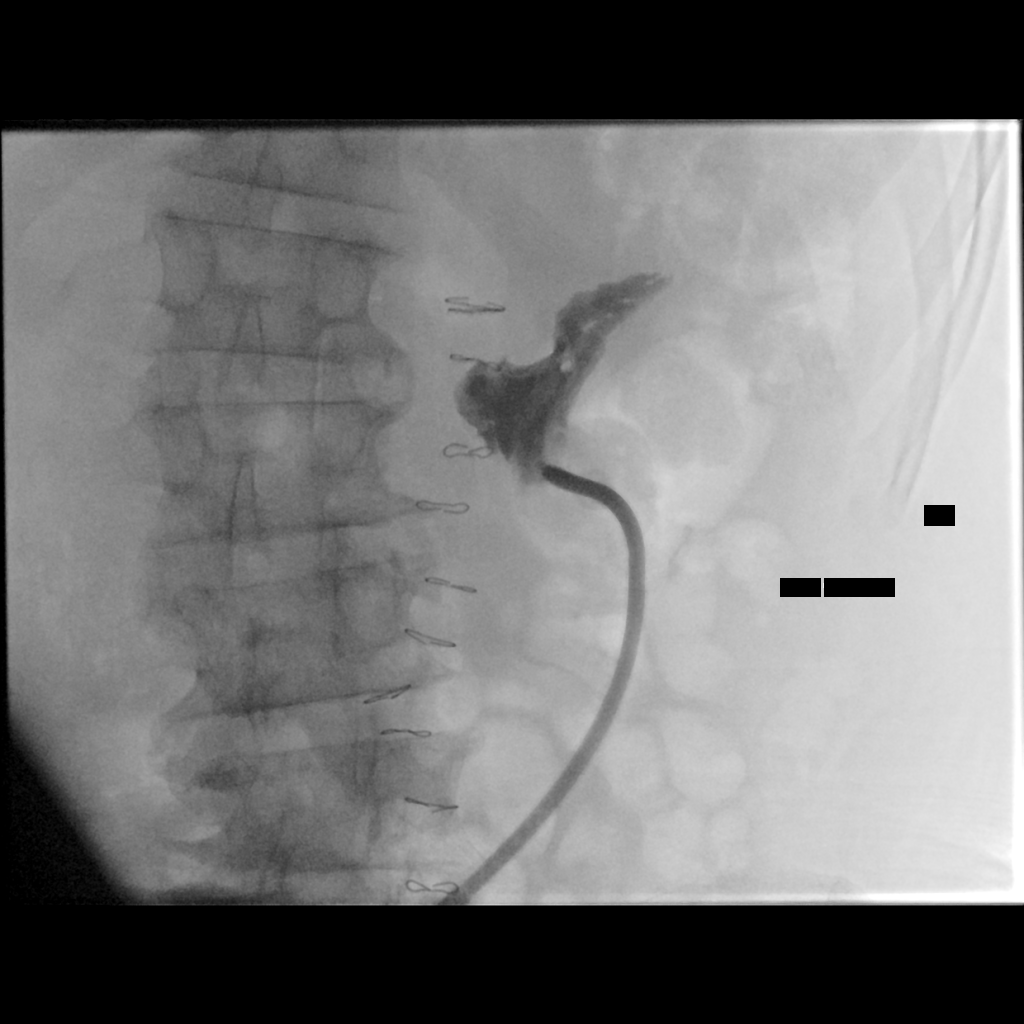
[im 4/4]
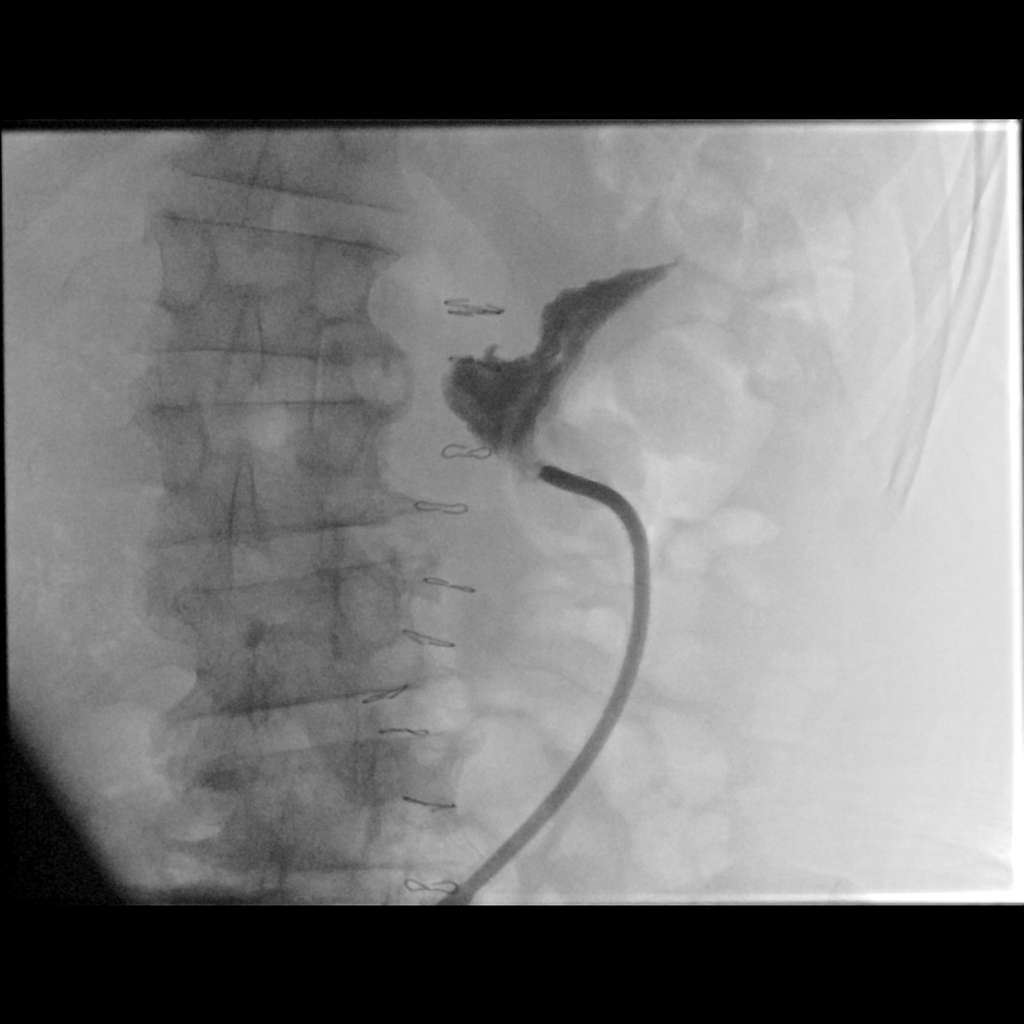

[Series 2: single · 1 of 1 slices shown]
[im 1/1]
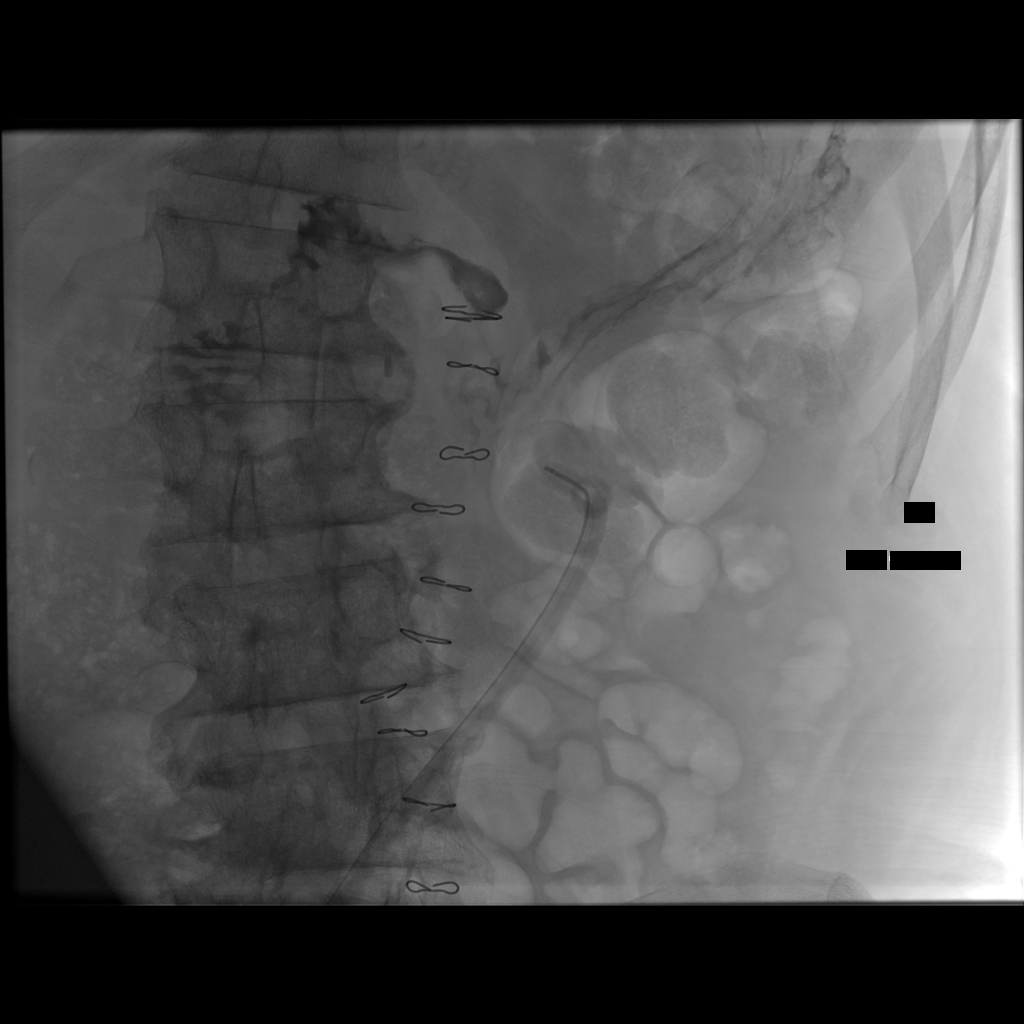

[5 of 5 positions shown; findings below may reference images not displayed]

EXAM:
REPLACEMENT OF PERCUTANEOUS GASTROSTOMY TUBE

MEDICATIONS:
None

ANESTHESIA/SEDATION:
None

CONTRAST:  10 mL Nsovue-C66-administered into the gastric lumen.

FLUOROSCOPY TIME:  Fluoroscopy Time: 6 seconds.  1.3 mGy.

COMPLICATIONS:
None immediate.

PROCEDURE:
Informed written consent was obtained from the patient's sister
after a thorough discussion of the procedural risks, benefits and
alternatives. All questions were addressed. Maximal Sterile Barrier
Technique was utilized including caps, mask, sterile gowns, sterile
gloves, sterile drape, hand hygiene and skin antiseptic. A timeout
was performed prior to the initiation of the procedure.

A pre-existing 20 French bumper retention gastrostomy tube was
inspected. The tract surrounding the tube exit site was infiltrated
with viscous lidocaine. The pre-existing tube was then removed
utilizing manual traction.

A new 20 French balloon retention gastrostomy tube was then advanced
through the percutaneous tract and into the gastric lumen. The
retention balloon was inflated with 8 mL of saline. The tube was
injected with contrast to confirm position under fluoroscopy.
FINDINGS: The pre-existing catheter was removed in its entirety including the
retention bumper. After placement of the new 20 French balloon
retention tube, contrast injection shows the catheter tip to be
within the body of the stomach. This tube is ready for immediate
use.
IMPRESSION: Replacement of 20 French bumper retention gastrostomy tube for new
20 French balloon retention catheter.

## 2018-09-26 IMAGING — CT CT HEAD W/O CM
3 series · 15 of 45 positions shown, 18 images · non-contrast
Comparison: 03/30/2017

CLINICAL DATA: Altered mental status

EXAM:
CT HEAD WITHOUT CONTRAST
TECHNIQUE: Contiguous axial images were obtained from the base of the skull
through the vertex without intravenous contrast.

[Series 4: head wo · axial · 0.40mm/px · z∈[+1713,+1828]mm · 9 of 28 slices shown, 12 images]
[im 3/28  brain]
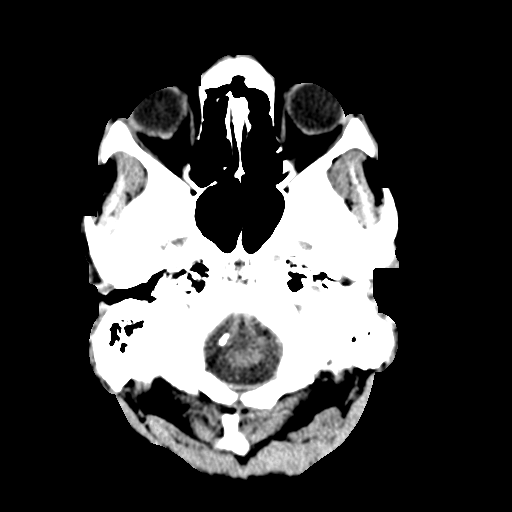
[im 3/28  bone]
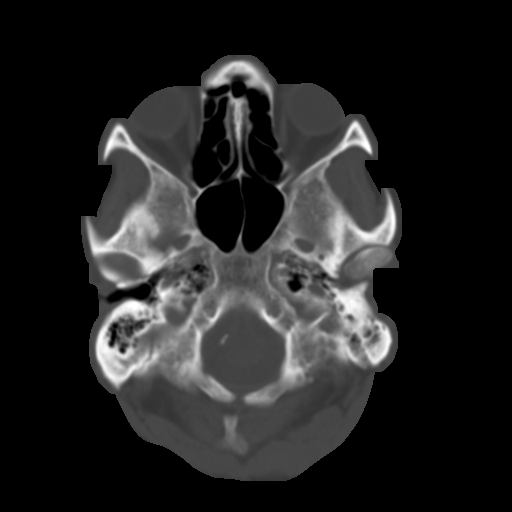
[im 6/28  brain]
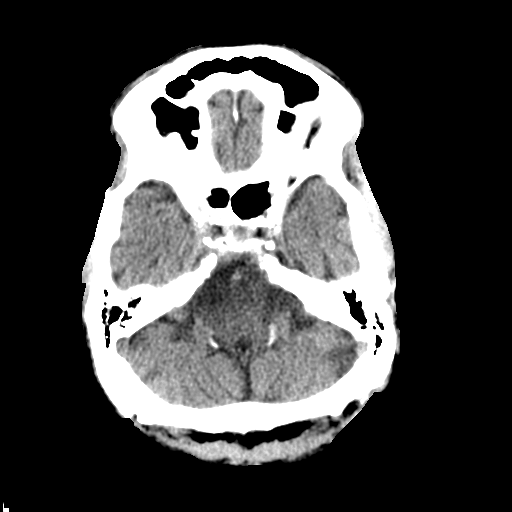
[im 9/28  brain]
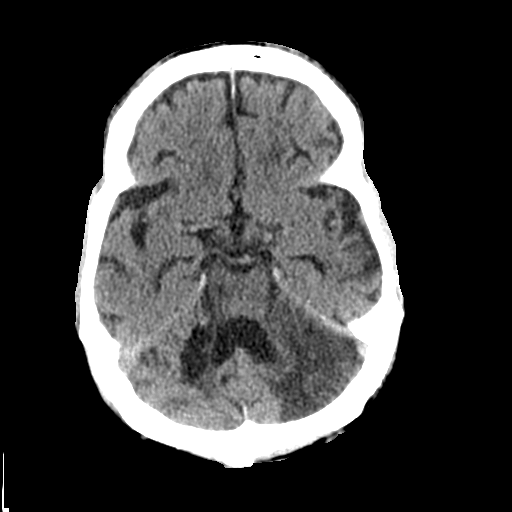
[im 12/28  brain]
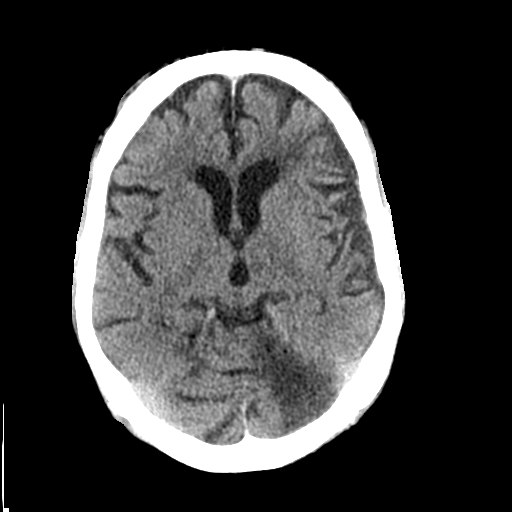
[im 15/28  brain]
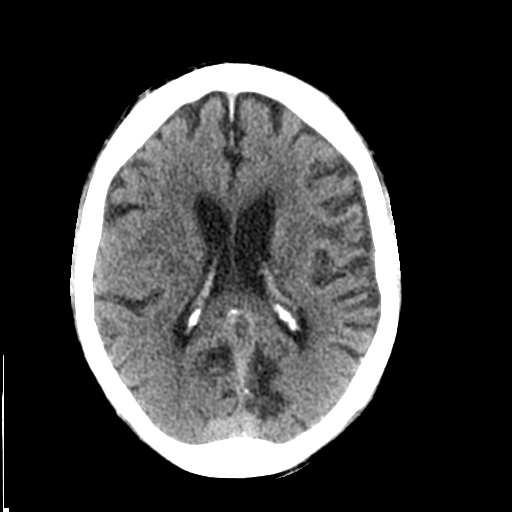
[im 15/28  bone]
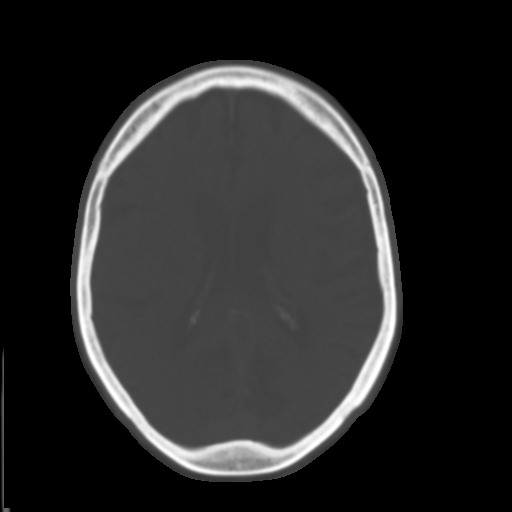
[im 17/28  brain]
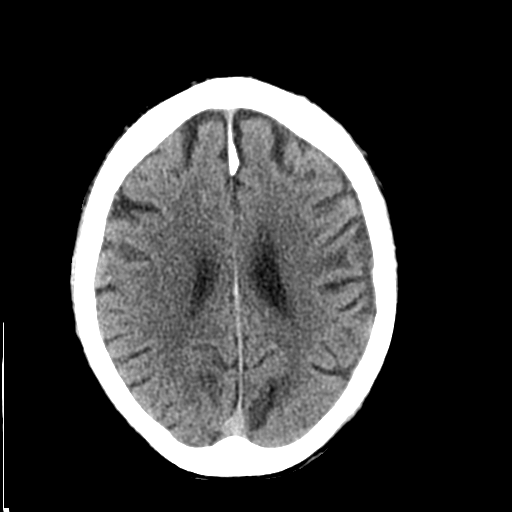
[im 20/28  brain]
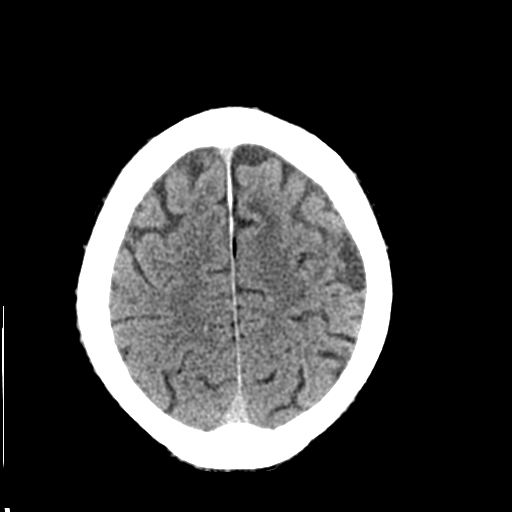
[im 23/28  brain]
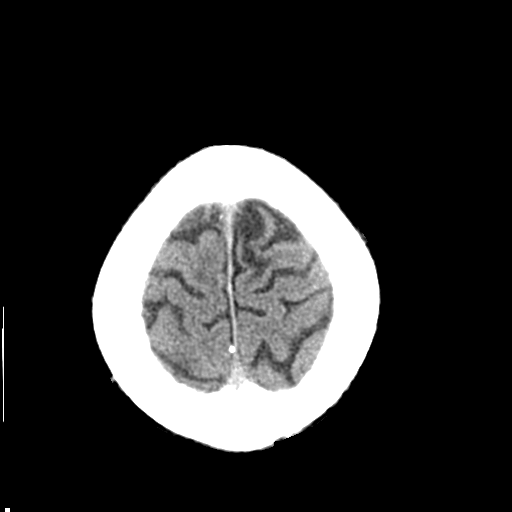
[im 26/28  brain]
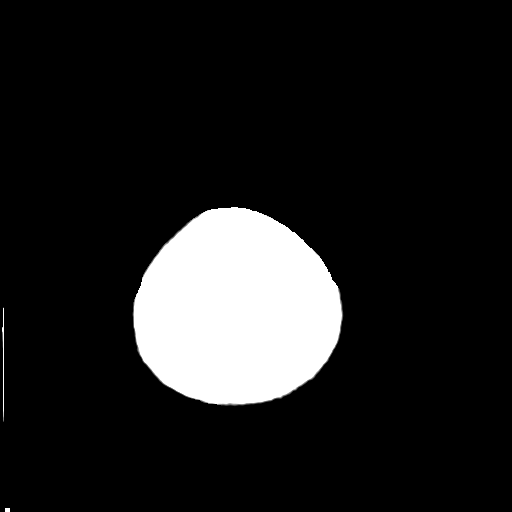
[im 26/28  bone]
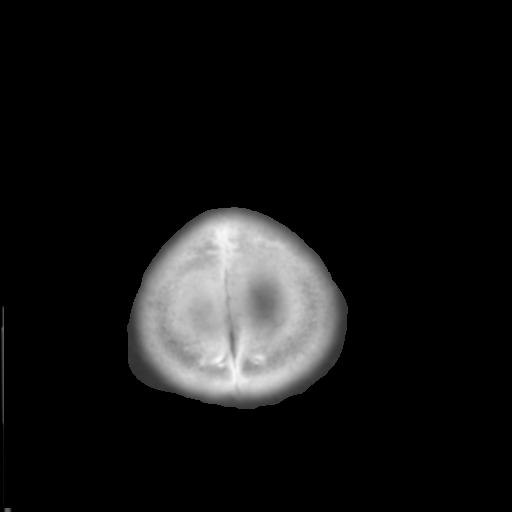

[Series 5: coronal soft tissue · coronal · 0.29mm/px · 3 of 64 slices shown]
[im 22/64  brain]
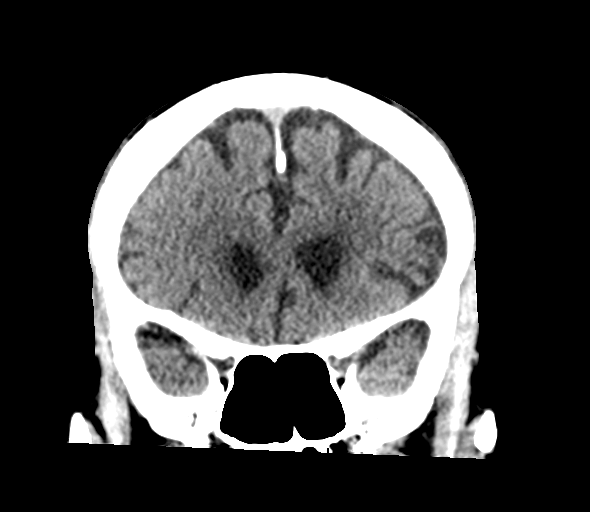
[im 29/64  brain]
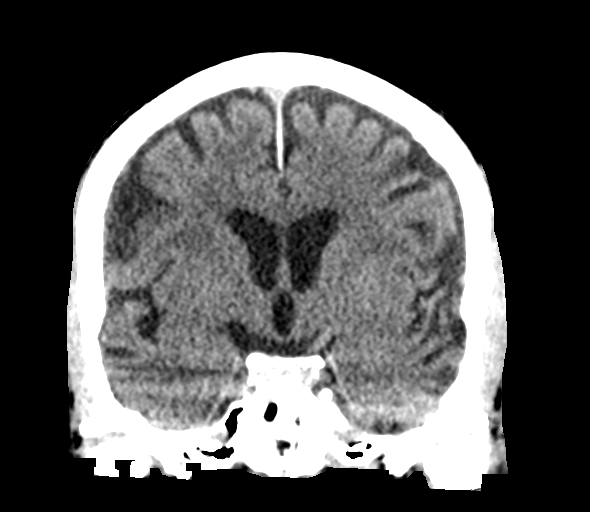
[im 36/64  brain]
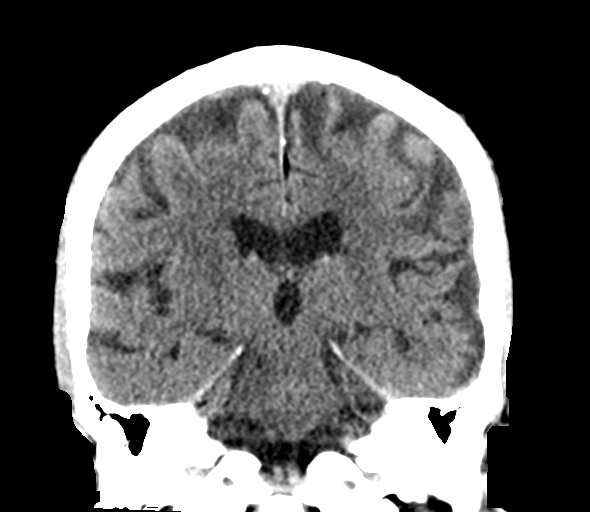

[Series 6: sagittal soft tissue · sagittal · 0.29mm/px · 3 of 59 slices shown]
[im 20/59  brain]
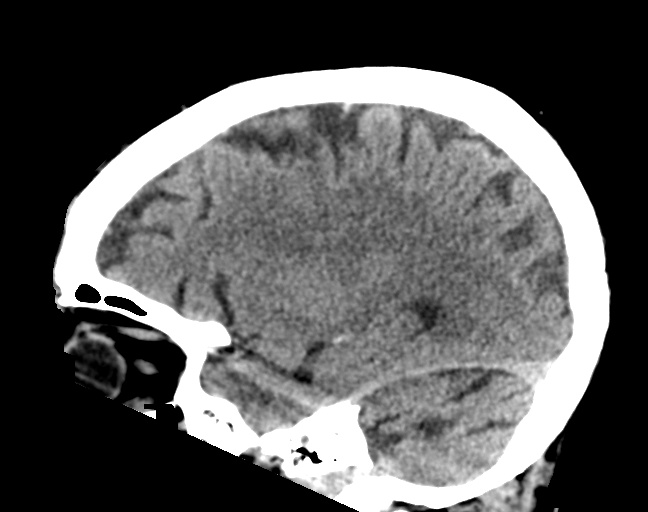
[im 30/59  brain]
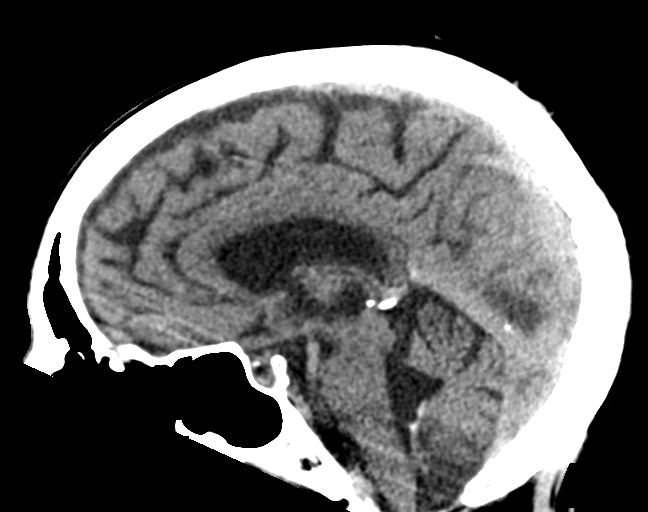
[im 39/59  brain]
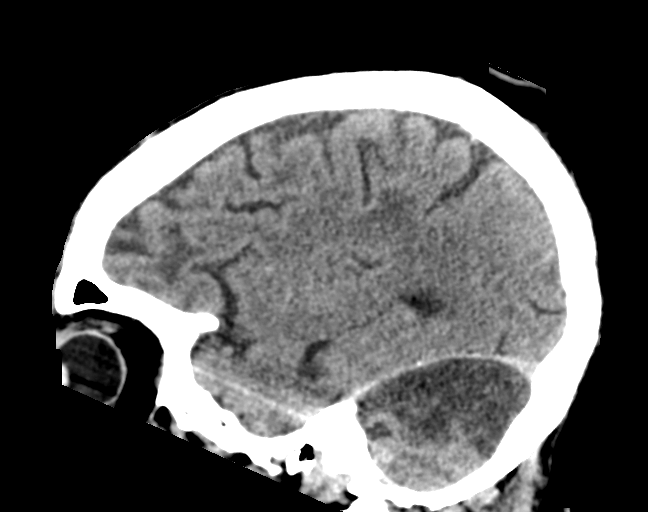

[15 of 45 positions shown; findings below may reference images not displayed]

FINDINGS: Brain: No evidence of acute infarction, hemorrhage, hydrocephalus,
extra-axial collection or mass lesion/mass effect.

Encephalomalacic changes related to prior left posterior frontal,
bilateral occipital, and bilateral cerebellar infarcts.

Subcortical white matter and periventricular small vessel ischemic
changes. Mild cortical atrophy.

Vascular: Intracranial atherosclerosis.

Skull: Normal. Negative for fracture or focal lesion.

Sinuses/Orbits: The visualized paranasal sinuses are essentially
clear. The mastoid air cells are unopacified.

Other: None.
IMPRESSION: No evidence of acute intracranial abnormality.

Encephalomalacic changes related to multiple prior infarcts, as
described above.

Atrophy with small vessel ischemic changes.

## 2018-09-26 IMAGING — CR DG CHEST 1V
2 series · 2 of 2 positions shown · non-contrast
Comparison: 07/16/2017

CLINICAL DATA: Weakness

EXAM:
CHEST  1 VIEW

[x chest ap (1 of 2)]
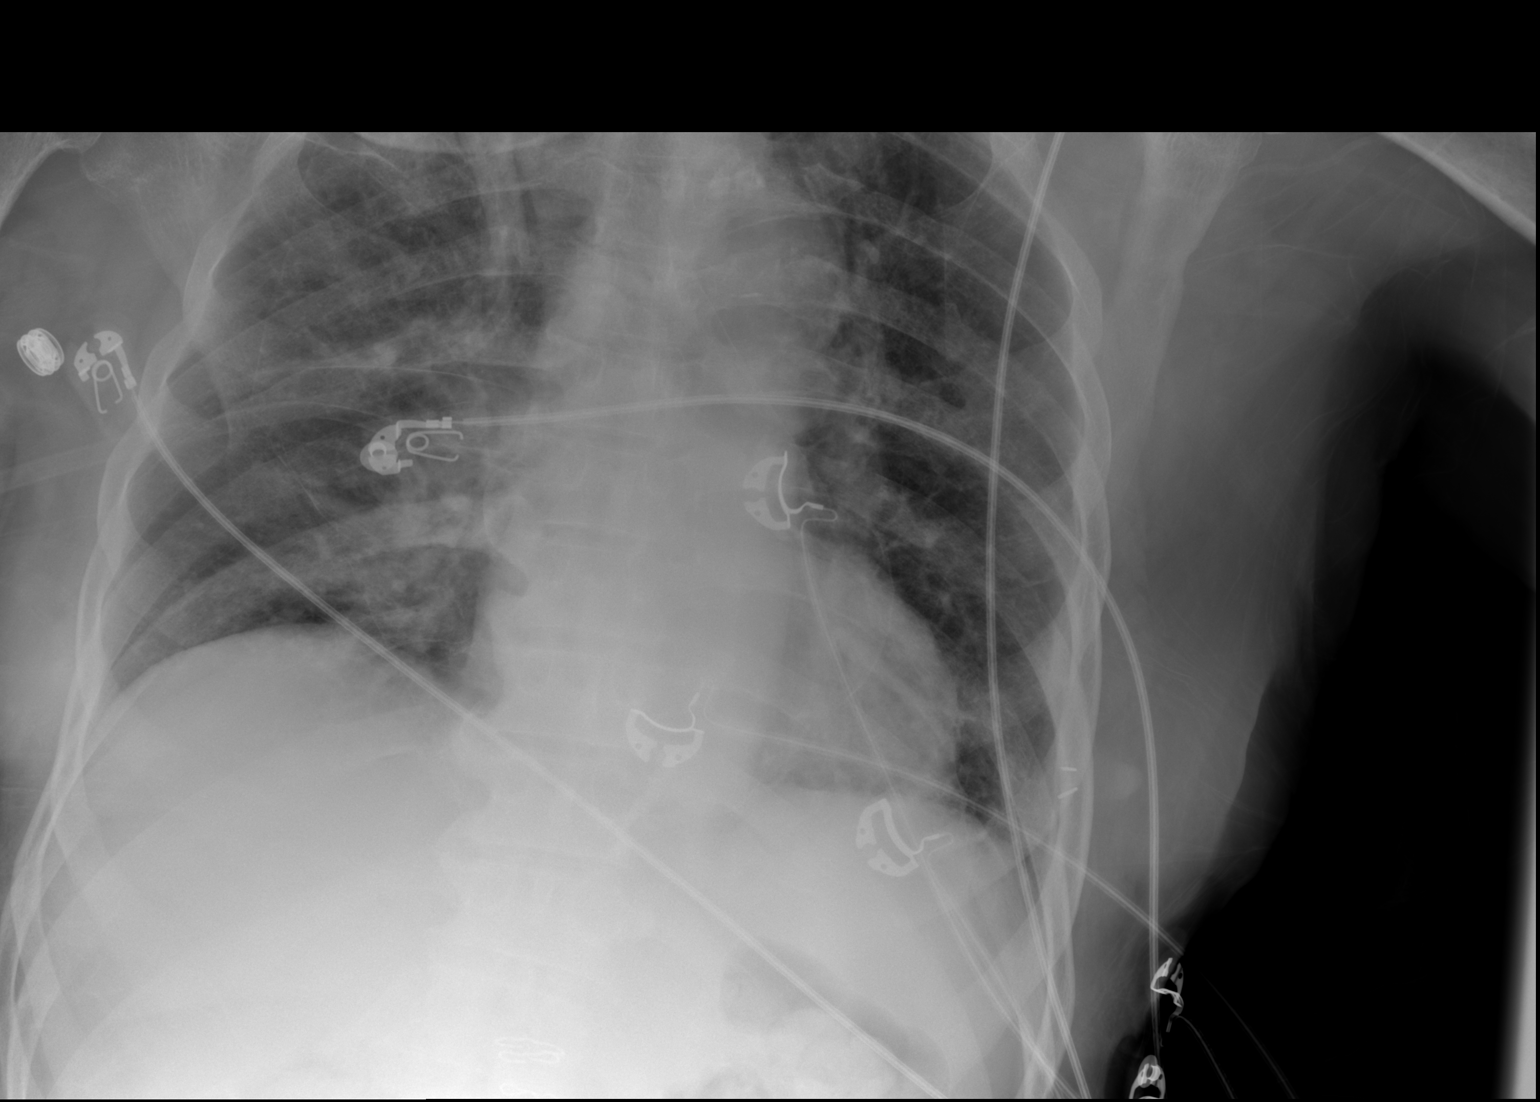

[x chest ap (2 of 2)]
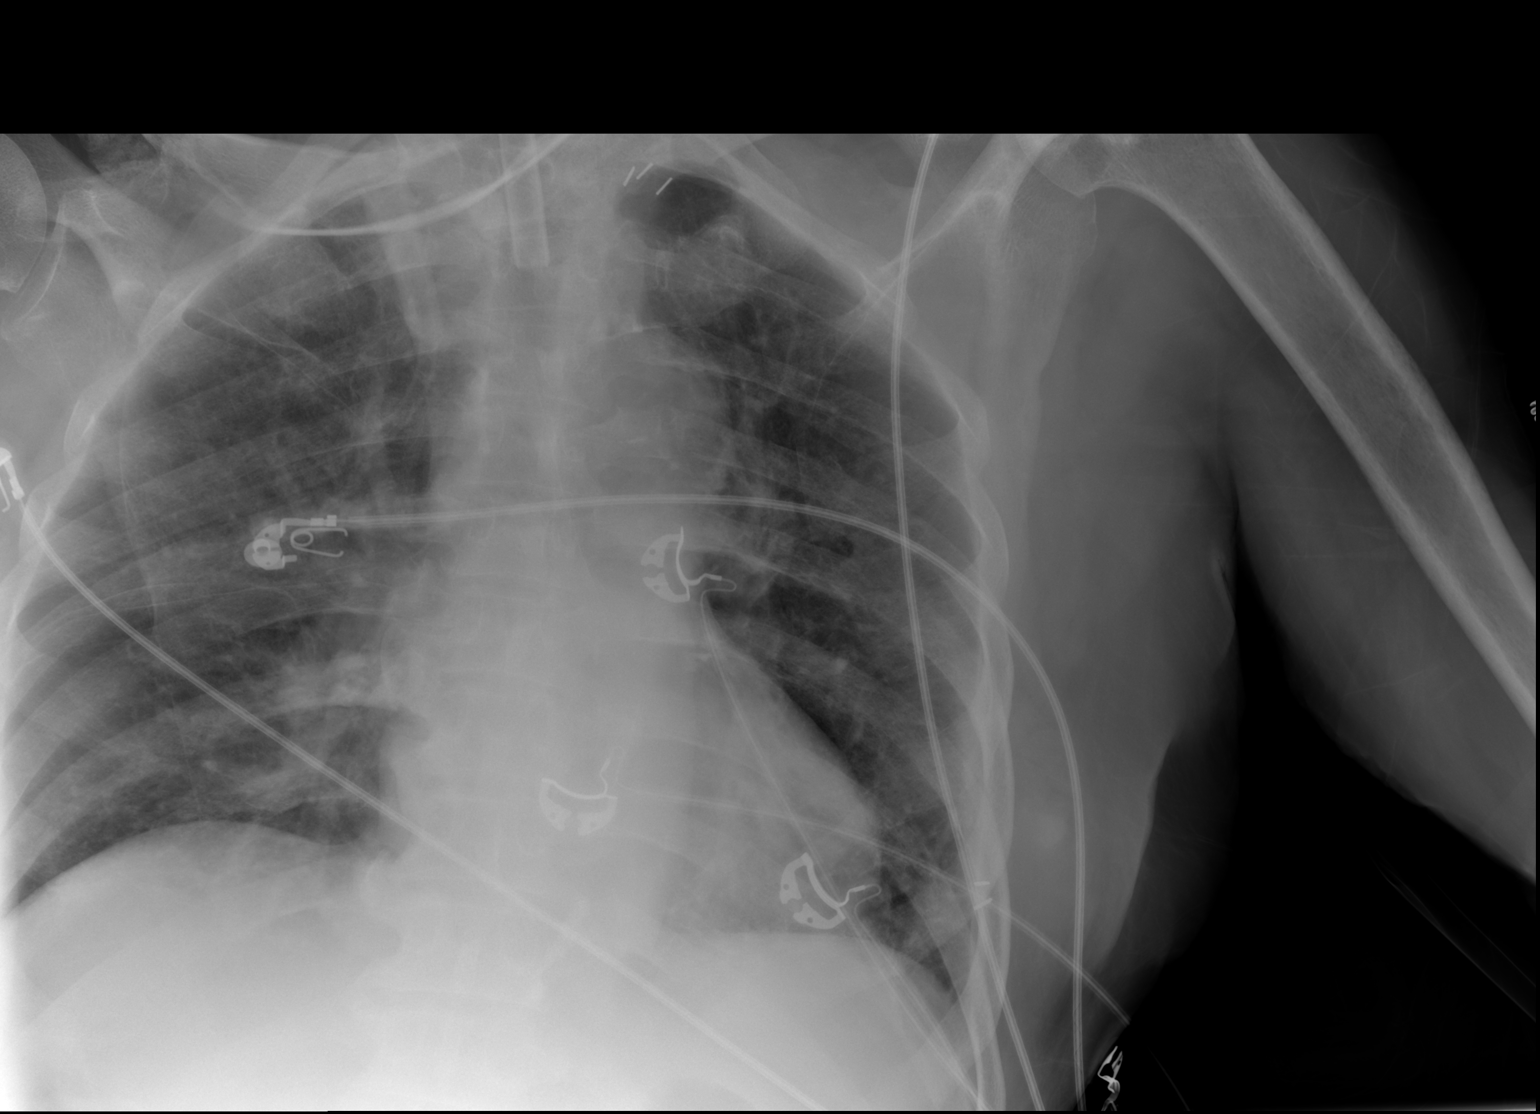

[2 of 2 positions shown; findings below may reference images not displayed]

FINDINGS: Tracheostomy tube is well seated. Normal heart size. Stable aortic
contours. Low volumes with interstitial crowding and streaky right
perihilar density that is less prominent on the second image. No
edema, effusion, or pneumothorax.

Changes of bone infarct in the proximal left humerus.
IMPRESSION: Low volume chest with probable perihilar atelectasis.

## 2018-10-05 ENCOUNTER — Other Ambulatory Visit: Payer: Self-pay

## 2018-10-05 ENCOUNTER — Emergency Department (HOSPITAL_BASED_OUTPATIENT_CLINIC_OR_DEPARTMENT_OTHER)
Admission: EM | Admit: 2018-10-05 | Discharge: 2018-10-05 | Disposition: A | Discharge status: Home / Self Care | Payer: Medicare HMO | Attending: Emergency Medicine | Admitting: Emergency Medicine

## 2018-10-05 ENCOUNTER — Encounter (HOSPITAL_BASED_OUTPATIENT_CLINIC_OR_DEPARTMENT_OTHER): Payer: Self-pay | Admitting: Emergency Medicine

## 2018-10-05 DIAGNOSIS — Z8673 Personal history of transient ischemic attack (TIA), and cerebral infarction without residual deficits: Secondary | ICD-10-CM | POA: Diagnosis not present

## 2018-10-05 DIAGNOSIS — N182 Chronic kidney disease, stage 2 (mild): Secondary | ICD-10-CM | POA: Diagnosis not present

## 2018-10-05 DIAGNOSIS — Z87891 Personal history of nicotine dependence: Secondary | ICD-10-CM | POA: Diagnosis not present

## 2018-10-05 DIAGNOSIS — Z7982 Long term (current) use of aspirin: Secondary | ICD-10-CM | POA: Diagnosis not present

## 2018-10-05 DIAGNOSIS — Z930 Tracheostomy status: Secondary | ICD-10-CM | POA: Diagnosis not present

## 2018-10-05 DIAGNOSIS — Z7902 Long term (current) use of antithrombotics/antiplatelets: Secondary | ICD-10-CM | POA: Diagnosis not present

## 2018-10-05 DIAGNOSIS — I129 Hypertensive chronic kidney disease with stage 1 through stage 4 chronic kidney disease, or unspecified chronic kidney disease: Secondary | ICD-10-CM | POA: Insufficient documentation

## 2018-10-05 DIAGNOSIS — L739 Follicular disorder, unspecified: Secondary | ICD-10-CM | POA: Insufficient documentation

## 2018-10-05 DIAGNOSIS — E1122 Type 2 diabetes mellitus with diabetic chronic kidney disease: Secondary | ICD-10-CM | POA: Insufficient documentation

## 2018-10-05 DIAGNOSIS — R21 Rash and other nonspecific skin eruption: Secondary | ICD-10-CM | POA: Diagnosis present

## 2018-10-05 LAB — CBC
HCT: 38.2 % — ABNORMAL LOW (ref 39.0–52.0)
Hemoglobin: 11.3 g/dL — ABNORMAL LOW (ref 13.0–17.0)
MCH: 29 pg (ref 26.0–34.0)
MCHC: 29.6 g/dL — ABNORMAL LOW (ref 30.0–36.0)
MCV: 98.2 fL (ref 80.0–100.0)
Platelets: 216 10*3/uL (ref 150–400)
RBC: 3.89 MIL/uL — ABNORMAL LOW (ref 4.22–5.81)
RDW: 13.9 % (ref 11.5–15.5)
WBC: 5.7 10*3/uL (ref 4.0–10.5)
nRBC: 0 % (ref 0.0–0.2)

## 2018-10-05 LAB — BASIC METABOLIC PANEL
Anion gap: 6 (ref 5–15)
BUN: 30 mg/dL — ABNORMAL HIGH (ref 8–23)
CO2: 30 mmol/L (ref 22–32)
Calcium: 9.2 mg/dL (ref 8.9–10.3)
Chloride: 101 mmol/L (ref 98–111)
Creatinine, Ser: 0.95 mg/dL (ref 0.61–1.24)
GFR calc Af Amer: >60 mL/min (ref >60)
GFR calc non Af Amer: >60 mL/min (ref >60)
Glucose, Bld: 86 mg/dL (ref 70–99)
Potassium: 5.1 mmol/L (ref 3.5–5.1)
Sodium: 137 mmol/L (ref 135–145)

## 2018-10-05 MED ORDER — DOXYCYCLINE HYCLATE 100 MG PO TABS
100.0000 mg | ORAL_TABLET | Freq: Once | ORAL | Status: AC
  Administered 2018-10-05: 100 mg via ORAL
  Filled 2018-10-05: qty 1

## 2018-10-05 MED ORDER — SODIUM CHLORIDE 0.9 % IV SOLN: 1.0000 g | Freq: Once | INTRAVENOUS | Status: DC

## 2018-10-05 MED ORDER — CEFTRIAXONE SODIUM 1 G IJ SOLR: 1.0000 g | Freq: Once | INTRAMUSCULAR | Status: DC

## 2018-10-05 MED ORDER — DOXYCYCLINE HYCLATE 100 MG PO CAPS: 100.0000 mg | ORAL_CAPSULE | Freq: Two times a day (BID) | ORAL | 0 refills | Status: DC

## 2018-10-05 NOTE — ED Triage Notes (Signed)
Pt from home. EMS transport. Family states he has skin break down on rash on back and arms. PT with trach. Bed bound

## 2018-10-05 NOTE — ED Notes (Signed)
[place] IV attempted x1 in L AC without success.

## 2018-10-05 NOTE — ED Provider Notes (Signed)
[facility] EMERGENCY DEPARTMENT Provider Note   CSN: [id number] Arrival date & time: 10/05/18  1647    History   Chief Complaint Chief Complaint  Patient presents with  . Skin Ulcer    HPI Scott Rivera is a 68 y.o. male.    HPI Patient presents to the emergency room for evaluation of a skin rash.  History is limited and the patient is not able to provide any history himself.  Patient has a history of multiple medical problems.  He is bedridden.  He has tracheostomy.  Patient resides at home with family members.  According to EMS report family members noted a rash involving his back and his arms. Past Medical History:  Diagnosis Date  . Acute pulmonary edema (HCC)   . Anemia due to chronic kidney disease   . ARF (acute renal failure) ([place]) 10/19/2016  . Aspiration pneumonia ([place])   . Cerebellar infarct ([place]) 10/19/2016  . Chronic kidney disease, stage 3 (moderate) (HCC)   . CKD (chronic kidney disease) stage 2, GFR 60-89 ml/min 01/02/2017  . 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 Gastrostomy tube obstruction ([place])   . HCAP (healthcare-associated pneumonia)   . Helicobacter pylori infection 09/21/2012  . History of adenomatous polyp of colon 07/28/2015  . Hypertension   . MDR Acinetobacter baumannii infection   . Pneumothorax, closed, traumatic, initial encounter 12/10/2016  . Renal insufficiency   . Rhabdomyolysis 10/19/2016  . S/P percutaneous endoscopic gastrostomy (PEG) tube placement ([place]) 03/30/2017  . Stage III pressure ulcer of sacral region ([place]) 12/17/2016  . Stroke ([place])   . UTI (urinary tract infection) 03/30/2017    Patient Active Problem List   Diagnosis Date Noted  . Pneumoperitoneum 04/09/2018  . Perforated viscus 04/09/2018  . 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 Aspiration of gastric contents   . Malnutrition of moderate degree 11/19/2017  . Aspiration pneumonia of right lower  lobe ([place]) 11/16/2017  . Nausea and vomiting 11/16/2017  . Tracheostomy tube present ([place]) 09/25/2017  . Chronic respiratory insufficiency 07/24/2017  . Hematuria 06/26/2017  . Foley catheter in place on admission 03/30/2017  . Diabetes mellitus ([place]) 03/14/2017  . History of tracheostomy 03/14/2017  . Percutaneous endoscopic gastrostomy status ([place]) 03/14/2017  . Gastroesophageal reflux disease without esophagitis 03/14/2017  . Acute respiratory failure with hypoxia ([place]) 03/02/2017  . Loculated pleural effusion   . CKD (chronic kidney disease) stage 2, GFR 60-89 ml/min 01/02/2017  . Anemia due to chronic kidney disease 12/09/2016  . Compensated metabolic alkalosis 60/73/7106  . Goals of care, counseling/discussion   . Palliative care by specialist   . Aspiration into airway   . Dysphagia, post-stroke   . Tachypnea   . Leukocytosis   . Acute blood loss anemia   . Pressure injury of skin 10/20/2016  . Essential hypertension 10/19/2016  . History of cerebral infarction 10/19/2016  . Bilateral chronic knee pain 01/07/2016  . Tobacco abuse 12/23/2014  . Mixed hyperlipidemia 07/28/2013    Past Surgical History:  Procedure Laterality Date  . IR GASTROSTOMY TUBE MOD SED  10/27/2016  . IR PATIENT EVAL TECH 0-60 MINS  01/05/2017  . IR PATIENT EVAL TECH 0-60 MINS  01/09/2017  . IR PATIENT EVAL TECH 0-60 MINS  03/03/2017  . IR PATIENT EVAL TECH 0-60 MINS  04/03/2017  . IR REPLACE G-TUBE SIMPLE WO FLUORO  11/20/2017  . IR [place] TUBE PERCUT W/FLUORO  07/23/2017  .  KNEE SURGERY    . TRACHEOSTOMY TUBE PLACEMENT          Home Medications    Prior to Admission medications   Medication Sig Start Date End Date Taking? Authorizing Provider  acetaminophen (TYLENOL) 500 MG tablet Place 1,000 mg into feeding tube 3 (three) times daily.     [provider]  Amino Acids-Protein Hydrolys (FEEDING SUPPLEMENT, PRO-STAT SUGAR FREE 64,) LIQD Take 30 mLs by mouth at bedtime.     [provider]  amLODipine (NORVASC) 10 MG tablet Place 1 tablet (10 mg total) into feeding tube daily. 11/23/17   Kayleen Memos, DO  aspirin 81 MG chewable tablet Place 1 tablet (81 mg total) into feeding tube daily. 04/05/17   Nita Sells, MD  atropine 1 % ophthalmic solution Place 2 drops under the tongue 2 (two) times daily. 04/16/18   Aline August, MD  bisacodyl (DULCOLAX) 10 MG suppository Place 1 suppository (10 mg total) rectally daily. 12/22/17   Thurnell Lose, MD  cloNIDine (CATAPRES - DOSED IN MG/24 HR) 0.1 mg/24hr patch Place 1 patch (0.1 mg total) onto the skin every Tuesday. 04/16/18   Aline August, MD  clopidogrel (PLAVIX) 75 MG tablet Place 1 tablet (75 mg total) into feeding tube daily. 01/11/17   Cherene Altes, MD  doxycycline (VIBRAMYCIN) 100 MG capsule Take 1 capsule (100 mg total) by mouth 2 (two) times daily. 10/05/18   Dorie Rank, MD  Ferrous Sulfate (IRON) 325 (65 Fe) MG TABS Take by mouth.    [provider]  gabapentin (NEURONTIN) 100 MG capsule 100 mg See admin instructions. Open 1 capsule (100 mg) and administer contents via feeding tube three times daily    [provider]  ipratropium-albuterol (DUONEB) 0.5-2.5 (3) MG/3ML SOLN Take 3 mLs by nebulization every 4 (four) hours as needed. Patient taking differently: Take 3 mLs by nebulization every 4 (four) hours as needed (shortness of breath/wheezing).  01/10/17   Cherene Altes, MD  metoprolol tartrate (LOPRESSOR) 50 MG tablet Take 1 tablet (50 mg total) by mouth 2 (two) times daily. 12/21/17   Thurnell Lose, MD  montelukast (SINGULAIR) 10 MG tablet Place 10 mg into feeding tube at bedtime.     [provider]  mouth rinse LIQD solution 10 mLs by Mouth Rinse route 4 (four) times daily.    [provider]  Nutritional Supplements (FEEDING SUPPLEMENT, JEVITY 1.2 CAL,) LIQD Place 1,000 mLs into feeding tube continuous. 11/22/17   Kayleen Memos, DO   ondansetron (ZOFRAN) 4 MG tablet Place 1 tablet (4 mg total) into feeding tube every 6 (six) hours as needed for nausea. 04/16/18   Aline August, MD  pantoprazole sodium (PROTONIX) 40 mg/20 mL PACK Place 20 mLs (40 mg total) into feeding tube 2 (two) times daily. 04/16/18   Aline August, MD  pravastatin (PRAVACHOL) 20 MG tablet Place 1 tablet (20 mg total) into feeding tube daily at 6 PM. Patient taking differently: Place 20 mg daily into feeding tube.  01/10/17   Cherene Altes, MD  senna (SENOKOT) 8.6 MG TABS tablet Take 1 tablet by mouth daily.    [provider]  thiamine 100 MG tablet Place 1 tablet (100 mg total) into feeding tube daily. 01/11/17   Cherene Altes, MD  vitamin C (ASCORBIC ACID) 500 MG tablet Take 500 mg by mouth daily.    [provider]  Water For Irrigation, Sterile (FREE WATER) SOLN Place 100 mLs into feeding  tube every 3 (three) hours. 04/16/18   Aline August, MD    Family History Family History  Problem Relation Age of Onset  . Diabetes Mellitus II Brother   . CAD Neg Hx   . Stroke Neg Hx     Social History Social History   Tobacco Use  . Smoking status: Former Research scientist (life sciences)  . Smokeless tobacco: Never Used  . Tobacco comment: Smoked heavily until the day of his stroke  Substance Use Topics  . Alcohol use: Not Currently    Comment: Drank heavily prior to stroke per sister  . Drug use: Not Currently     Allergies   Patient has no known allergies.   Review of Systems Review of Systems  All other systems reviewed and are negative.    Physical Exam Updated Vital Signs BP 118/67   Pulse 64   Temp 98 F (36.7 C) (Oral)   Resp (!) 22   SpO2 98%   Physical Exam Vitals signs and nursing note reviewed.  Constitutional:      General: He is not in acute distress.    Appearance: He is well-developed. He is ill-appearing.     Comments: Patient's eyes are open, he looks at me when I speak to him  HENT:     Head:  Normocephalic and atraumatic.     Right Ear: External ear normal.     Left Ear: External ear normal.  Eyes:     General: No scleral icterus.       Right eye: No discharge.        Left eye: No discharge.     Conjunctiva/sclera: Conjunctivae normal.  Neck:     Musculoskeletal: Neck supple.     Trachea: No tracheal deviation.  Cardiovascular:     Rate and Rhythm: Normal rate and regular rhythm.  Pulmonary:     Effort: Pulmonary effort is normal. No respiratory distress.     Breath sounds: Normal breath sounds. No stridor. No wheezing or rales.  Abdominal:     General: Bowel sounds are normal. There is no distension.     Palpations: Abdomen is soft.     Tenderness: There is no abdominal tenderness. There is no guarding or rebound.  Musculoskeletal:        General: No tenderness.     Comments: No decubitus ulcer noted, atrophy of the extremities, left knee Joint enlarged, questionable effusion  Skin:    General: Skin is warm and dry.     Findings: Rash present.     Comments: Pustular/vesicular skin rash, noted on the torso as well as extremities, slight erythema at the base  Neurological:     Mental Status: He is alert.     Cranial Nerves: No cranial nerve deficit (no facial droop, extraocular movements intact, no slurred speech).     Sensory: No sensory deficit.     Motor: No abnormal muscle tone or seizure activity.     Coordination: Coordination normal.        ED Treatments / Results  Labs (all labs ordered are listed, but only abnormal results are displayed) Labs Reviewed  CBC - Abnormal; Notable for the following components:      Result Value   RBC 3.89 (*)    Hemoglobin 11.3 (*)    HCT 38.2 (*)    MCHC 29.6 (*)    All other components within normal limits  BASIC METABOLIC PANEL - Abnormal; Notable for the following components:   BUN 30 (*)  All other components within normal limits  HSV CULTURE AND TYPING  AEROBIC CULTURE (SUPERFICIAL SPECIMEN)  CULTURE,  BLOOD (ROUTINE X 2)  CULTURE, BLOOD (ROUTINE X 2)     Procedures Procedures (including critical care time)  Medications Ordered in ED Medications  doxycycline (VIBRA-TABS) tablet 100 mg (has no administration in time range)     Initial Impression / Assessment and Plan / ED Course  I have reviewed the triage vital signs and the nursing notes.  Pertinent labs & imaging results that were available during my care of the patient were reviewed by me and considered in my medical decision making (see chart for details).  Clinical Course as of Oct 04 1900  Sat Oct 05, 2018  1844 Labs reviewed.  No significant leukocytosis.  Electrolyte panel unremarkable.   [JK]    Clinical Course User Index [JK] Dorie Rank, MD     Pt presents with a pustular/vesicular rash.  Non toxic.  No systemic signs of infection.  Nl wbc.  No fever.  Doubt sepsis, meningococcus.  Shingles unlikely with the diffuse nature.   Would suspect systemic symptoms for disseminated varicella.  Folliculitis?  Will dc home on abx.  Follow up with PCP  Final Clinical Impressions(s) / ED Diagnoses   Final diagnoses:  Folliculitis    ED Discharge Orders         Ordered    doxycycline (VIBRAMYCIN) 100 MG capsule  2 times daily     10/05/18 1901           Dorie Rank, MD 10/05/18 1902

## 2018-10-05 NOTE — ED Notes (Signed)
[facility] contacted for transport home by [facility]

## 2018-10-05 NOTE — ED Notes (Signed)
Attempted IV times 2. BC times one obtained.

## 2018-10-05 NOTE — ED Notes (Signed)
Spoke with patients son. His wife is coming to ED. Informed can be with patient in the room since he is non-verbal. MD aware

## 2018-10-05 NOTE — ED Notes (Signed)
MD aware of unable to get 2nd BC. No new orders.

## 2018-10-05 NOTE — Discharge Instructions (Addendum)
Antibiotics as prescribed, follow-up with his doctor next week to be rechecked to make sure the symptoms are improving.  Return to a hospital emergency room for worsening symptoms including fever.

## 2018-10-05 NOTE — ED Notes (Signed)
ED Provider at bedside. 

## 2018-10-05 NOTE — ED Notes (Signed)
[facility] here for transport. Family updated by EDP and RN prior to [facility] arrival. Pt suctioned by [facility] prior to leaving facility. Discharge papers sent with pt home.

## 2018-10-07 ENCOUNTER — Other Ambulatory Visit: Payer: Self-pay

## 2018-10-07 ENCOUNTER — Encounter (HOSPITAL_BASED_OUTPATIENT_CLINIC_OR_DEPARTMENT_OTHER): Payer: Self-pay

## 2018-10-07 ENCOUNTER — Emergency Department (HOSPITAL_BASED_OUTPATIENT_CLINIC_OR_DEPARTMENT_OTHER)
Admission: EM | Admit: 2018-10-07 | Discharge: 2018-10-07 | Disposition: A | Discharge status: Home / Self Care | Payer: Medicare HMO | Attending: Emergency Medicine | Admitting: Emergency Medicine

## 2018-10-07 DIAGNOSIS — K9423 Gastrostomy malfunction: Secondary | ICD-10-CM | POA: Diagnosis not present

## 2018-10-07 DIAGNOSIS — Z79899 Other long term (current) drug therapy: Secondary | ICD-10-CM | POA: Insufficient documentation

## 2018-10-07 DIAGNOSIS — N183 Chronic kidney disease, stage 3 (moderate): Secondary | ICD-10-CM | POA: Insufficient documentation

## 2018-10-07 DIAGNOSIS — E1122 Type 2 diabetes mellitus with diabetic chronic kidney disease: Secondary | ICD-10-CM | POA: Insufficient documentation

## 2018-10-07 DIAGNOSIS — Z7982 Long term (current) use of aspirin: Secondary | ICD-10-CM | POA: Insufficient documentation

## 2018-10-07 DIAGNOSIS — I129 Hypertensive chronic kidney disease with stage 1 through stage 4 chronic kidney disease, or unspecified chronic kidney disease: Secondary | ICD-10-CM | POA: Diagnosis not present

## 2018-10-07 DIAGNOSIS — Z87891 Personal history of nicotine dependence: Secondary | ICD-10-CM | POA: Diagnosis not present

## 2018-10-07 NOTE — ED Provider Notes (Signed)
[facility] EMERGENCY DEPARTMENT Provider Note   CSN: [id number] Arrival date & time: 10/07/18  0831     History   Chief Complaint Chief Complaint  Patient presents with  . GI Problem    HPI Scott Rivera is a 68 y.o. male.     HPI   20yM with leaking feeding tube. 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 Tube developed crack (when?) and is now leaking. There do not appear to be any other acute issues.   Past Medical History:  Diagnosis Date  . Acute pulmonary edema (HCC)   . Anemia due to chronic kidney disease   . ARF (acute renal failure) ([place]) 10/19/2016  . Aspiration pneumonia ([place])   . Cerebellar infarct ([place]) 10/19/2016  . Chronic kidney disease, stage 3 (moderate) (HCC)   . CKD (chronic kidney disease) stage 2, GFR 60-89 ml/min 01/02/2017  . 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 Gastrostomy tube obstruction ([place])   . HCAP (healthcare-associated pneumonia)   . Helicobacter pylori infection 09/21/2012  . History of adenomatous polyp of colon 07/28/2015  . Hypertension   . MDR Acinetobacter baumannii infection   . Pneumothorax, closed, traumatic, initial encounter 12/10/2016  . Renal insufficiency   . Rhabdomyolysis 10/19/2016  . S/P percutaneous endoscopic gastrostomy (PEG) tube placement ([place]) 03/30/2017  . Stage III pressure ulcer of sacral region ([place]) 12/17/2016  . Stroke ([place])   . UTI (urinary tract infection) 03/30/2017    Patient Active Problem List   Diagnosis Date Noted  . Pneumoperitoneum 04/09/2018  . Perforated viscus 04/09/2018  . 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 Aspiration of gastric contents   . Malnutrition of moderate degree 11/19/2017  . Aspiration pneumonia of right lower lobe ([place]) 11/16/2017  . Nausea and vomiting 11/16/2017  . Tracheostomy tube present ([place]) 09/25/2017  . Chronic respiratory insufficiency 07/24/2017  .  Hematuria 06/26/2017  . Foley catheter in place on admission 03/30/2017  . Diabetes mellitus ([place]) 03/14/2017  . History of tracheostomy 03/14/2017  . Percutaneous endoscopic gastrostomy status ([place]) 03/14/2017  . Gastroesophageal reflux disease without esophagitis 03/14/2017  . Acute respiratory failure with hypoxia ([place]) 03/02/2017  . Loculated pleural effusion   . CKD (chronic kidney disease) stage 2, GFR 60-89 ml/min 01/02/2017  . Anemia due to chronic kidney disease 12/09/2016  . Compensated metabolic alkalosis 18/56/3149  . Goals of care, counseling/discussion   . Palliative care by specialist   . Aspiration into airway   . Dysphagia, post-stroke   . Tachypnea   . Leukocytosis   . Acute blood loss anemia   . Pressure injury of skin 10/20/2016  . Essential hypertension 10/19/2016  . History of cerebral infarction 10/19/2016  . Bilateral chronic knee pain 01/07/2016  . Tobacco abuse 12/23/2014  . Mixed hyperlipidemia 07/28/2013    Past Surgical History:  Procedure Laterality Date  . IR GASTROSTOMY TUBE MOD SED  10/27/2016  . IR PATIENT EVAL TECH 0-60 MINS  01/05/2017  . IR PATIENT EVAL TECH 0-60 MINS  01/09/2017  . IR PATIENT EVAL TECH 0-60 MINS  03/03/2017  . IR PATIENT EVAL TECH 0-60 MINS  04/03/2017  . IR REPLACE G-TUBE SIMPLE WO FLUORO  11/20/2017  . IR [place] TUBE PERCUT W/FLUORO  07/23/2017  . KNEE SURGERY    . TRACHEOSTOMY TUBE PLACEMENT          Home Medications    Prior to Admission  medications   Medication Sig Start Date End Date Taking? Authorizing Provider  acetaminophen (TYLENOL) 500 MG tablet Place 1,000 mg into feeding tube 3 (three) times daily.     [provider]  Amino Acids-Protein Hydrolys (FEEDING SUPPLEMENT, PRO-STAT SUGAR FREE 64,) LIQD Take 30 mLs by mouth at bedtime.    [provider]  amLODipine (NORVASC) 10 MG tablet Place 1 tablet (10 mg total) into feeding tube daily. 11/23/17   Kayleen Memos, DO  aspirin 81  MG chewable tablet Place 1 tablet (81 mg total) into feeding tube daily. 04/05/17   Nita Sells, MD  atropine 1 % ophthalmic solution Place 2 drops under the tongue 2 (two) times daily. 04/16/18   Aline August, MD  bisacodyl (DULCOLAX) 10 MG suppository Place 1 suppository (10 mg total) rectally daily. 12/22/17   Thurnell Lose, MD  cloNIDine (CATAPRES - DOSED IN MG/24 HR) 0.1 mg/24hr patch Place 1 patch (0.1 mg total) onto the skin every Tuesday. 04/16/18   Aline August, MD  clopidogrel (PLAVIX) 75 MG tablet Place 1 tablet (75 mg total) into feeding tube daily. 01/11/17   Cherene Altes, MD  doxycycline (VIBRAMYCIN) 100 MG capsule Take 1 capsule (100 mg total) by mouth 2 (two) times daily. 10/05/18   Dorie Rank, MD  Ferrous Sulfate (IRON) 325 (65 Fe) MG TABS Take by mouth.    [provider]  gabapentin (NEURONTIN) 100 MG capsule 100 mg See admin instructions. Open 1 capsule (100 mg) and administer contents via feeding tube three times daily    [provider]  ipratropium-albuterol (DUONEB) 0.5-2.5 (3) MG/3ML SOLN Take 3 mLs by nebulization every 4 (four) hours as needed. Patient taking differently: Take 3 mLs by nebulization every 4 (four) hours as needed (shortness of breath/wheezing).  01/10/17   Cherene Altes, MD  metoprolol tartrate (LOPRESSOR) 50 MG tablet Take 1 tablet (50 mg total) by mouth 2 (two) times daily. 12/21/17   Thurnell Lose, MD  montelukast (SINGULAIR) 10 MG tablet Place 10 mg into feeding tube at bedtime.     [provider]  mouth rinse LIQD solution 10 mLs by Mouth Rinse route 4 (four) times daily.    [provider]  Nutritional Supplements (FEEDING SUPPLEMENT, JEVITY 1.2 CAL,) LIQD Place 1,000 mLs into feeding tube continuous. 11/22/17   Kayleen Memos, DO  ondansetron (ZOFRAN) 4 MG tablet Place 1 tablet (4 mg total) into feeding tube every 6 (six) hours as needed for nausea. 04/16/18   Aline August, MD  pantoprazole  sodium (PROTONIX) 40 mg/20 mL PACK Place 20 mLs (40 mg total) into feeding tube 2 (two) times daily. 04/16/18   Aline August, MD  pravastatin (PRAVACHOL) 20 MG tablet Place 1 tablet (20 mg total) into feeding tube daily at 6 PM. Patient taking differently: Place 20 mg daily into feeding tube.  01/10/17   Cherene Altes, MD  senna (SENOKOT) 8.6 MG TABS tablet Take 1 tablet by mouth daily.    [provider]  thiamine 100 MG tablet Place 1 tablet (100 mg total) into feeding tube daily. 01/11/17   Cherene Altes, MD  vitamin C (ASCORBIC ACID) 500 MG tablet Take 500 mg by mouth daily.    [provider]  Water For Irrigation, Sterile (FREE WATER) SOLN Place 100 mLs into feeding tube every 3 (three) hours. 04/16/18   Aline August, MD    Family History Family History  Problem Relation Age of Onset  .  Diabetes Mellitus II Brother   . CAD Neg Hx   . Stroke Neg Hx     Social History Social History   Tobacco Use  . Smoking status: Former Research scientist (life sciences)  . Smokeless tobacco: Never Used  . Tobacco comment: Smoked heavily until the day of his stroke  Substance Use Topics  . Alcohol use: Not Currently    Comment: Drank heavily prior to stroke per sister  . Drug use: Not Currently     Allergies   Patient has no known allergies.   Review of Systems Review of Systems  Level 5 caveat because pt is nonverbal.  Physical Exam Updated Vital Signs BP 132/67 (BP Location: Right Arm)   Pulse 68   Temp 98.2 F (36.8 C) (Oral)   Resp 20   SpO2 96%   Physical Exam Vitals signs and nursing note reviewed.  Constitutional:      General: He is not in acute distress.    Appearance: He is well-developed.  HENT:     Head: Normocephalic and atraumatic.  Eyes:     General:        Right eye: No discharge.        Left eye: No discharge.     Conjunctiva/sclera: Conjunctivae normal.  Neck:     Musculoskeletal: Neck supple.  Cardiovascular:     Rate and Rhythm: Normal rate  and regular rhythm.     Heart sounds: Normal heart sounds. No murmur. No friction rub. No gallop.   Pulmonary:     Effort: Pulmonary effort is normal. No respiratory distress.     Breath sounds: Normal breath sounds.     Comments: trach Abdominal:     General: There is no distension.     Palpations: Abdomen is soft.     Tenderness: There is no abdominal tenderness.     Comments: Gastrostomy tube. Small crack near where tubing joins into ports. Leak is dependent on position of tube and appears to be gastric contents. When flushed there is minimal leaking.   Musculoskeletal:        General: No tenderness.     Comments: contractures  Skin:    General: Skin is warm and dry.  Neurological:     Mental Status: He is alert.  Psychiatric:        Behavior: Behavior normal.        Thought Content: Thought content normal.      ED Treatments / Results  Labs (all labs ordered are listed, but only abnormal results are displayed) Labs Reviewed - No data to display  EKG    Radiology No results found.  Procedures Procedures (including critical care time)  Medications Ordered in ED Medications - No data to display   Initial Impression / Assessment and Plan / ED Course  I have reviewed the triage vital signs and the nursing notes.  Pertinent labs & imaging results that were available during my care of the patient were reviewed by me and considered in my medical decision making (see chart for details).        68yM with leaking gastrostomy tube. Small crack near where tubing meets the ports, but still seems functional. We do not have similar tube stocked at [facility]. Placing a foley wouldn't accomplish much because it would have to be clamps and would be exchanged soon as well.  Made appointment with IR at 2:30 pm tomorrow at [facility] for replacement.   Final Clinical Impressions(s) / ED Diagnoses   Final diagnoses:  Gastrostomy tube dysfunction [facility])    ED Discharge Orders    None        Virgel Manifold, MD 10/10/18 [phone]

## 2018-10-07 NOTE — Discharge Instructions (Signed)
We do not have the appropriate gastrostomy tubes at this facility for replacement. The tube is leaking but still seems functional. We made you an appointment for replacement with interventional radiology at [facility] tomorrow at 2:30 pm.  You need to arrive to the interventional radiology department at least 20 minutes before your appointment. If you cannot sign your own consent then you need to have someone that can sign for you.

## 2018-10-07 NOTE — ED Notes (Signed)
[facility] called for transport home. 

## 2018-10-07 NOTE — ED Triage Notes (Signed)
Pt from home with G-tube cracked, and leaking, here for tube replacement.  Pt has existing trach with O2. NPO except for tube feedings

## 2018-10-07 NOTE — ED Notes (Signed)
Pt family sarah young updated on plan of care, g tube to be replaced tomorrow at [facility] in IR. Crack in top of tube, and leak is position dependent. Lucile Crater to place a towel around base of tube when feeding and flushing as there will be some leakage, but if tube is held in upward position leaking should be minimal and majority of feeding should infuse. [name] verbalizes understanding of plan of care, will call me if she has further questions.

## 2018-10-07 NOTE — Progress Notes (Signed)
RT note: RT cleaned patients trach inner canula and suctioned patient. Patient had a small amount of white thick secretions. Patients tolerated well vital signs stable through out. Trach secure and in place.

## 2018-10-07 NOTE — ED Notes (Signed)
Per family they attempted to administer tube feeding this morning and it leaked out around the tube

## 2018-10-08 ENCOUNTER — Other Ambulatory Visit (HOSPITAL_COMMUNITY): Payer: Medicare HMO

## 2018-10-08 ENCOUNTER — Other Ambulatory Visit (HOSPITAL_COMMUNITY): Payer: Self-pay | Admitting: Emergency Medicine

## 2018-10-08 ENCOUNTER — Inpatient Hospital Stay (HOSPITAL_COMMUNITY): Admission: RE | Admit: 2018-10-08 | Payer: Medicare HMO | Source: Ambulatory Visit

## 2018-10-08 DIAGNOSIS — K9423 Gastrostomy malfunction: Secondary | ICD-10-CM

## 2018-10-09 LAB — HSV CULTURE AND TYPING

## 2018-10-10 LAB — CULTURE, BLOOD (ROUTINE X 2): Culture: NO GROWTH

## 2018-11-06 IMAGING — RF DG SWALLOWING FUNCTION
1 series · 17 of 24 positions shown · non-contrast
Comparison: None.

CLINICAL DATA: History of CVA.

EXAM:
MODIFIED BARIUM SWALLOW
TECHNIQUE: Different consistencies of barium were administered orally to the
patient by the Speech Pathologist. Imaging of the pharynx was
performed in the lateral projection.
FLUOROSCOPY TIME:  Fluoroscopy Time:  Fine 57 seconds
Radiation Exposure Index (if provided by the fluoroscopic device):
Number of Acquired Spot Images: 0

[Series 1: run · 7 acquisitions, 17 frames shown]
[im 1/7]
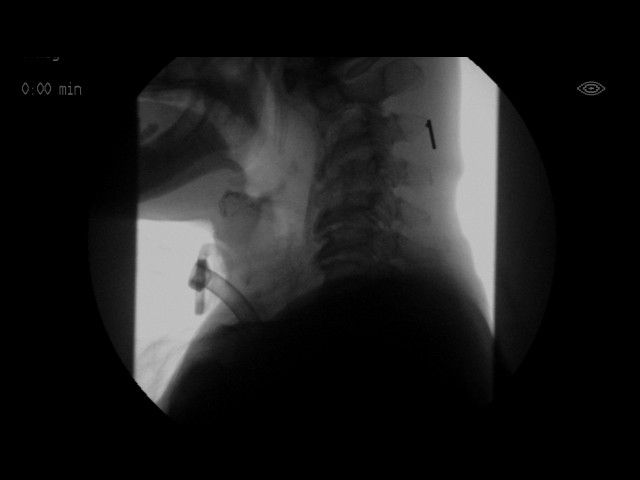
[im 1/7]
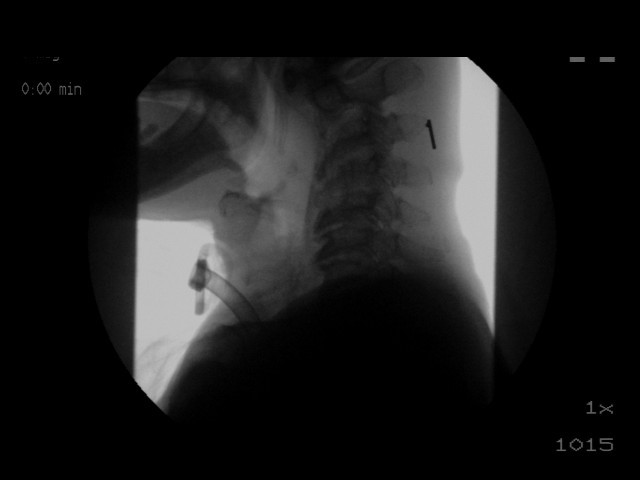
[im 2/7]
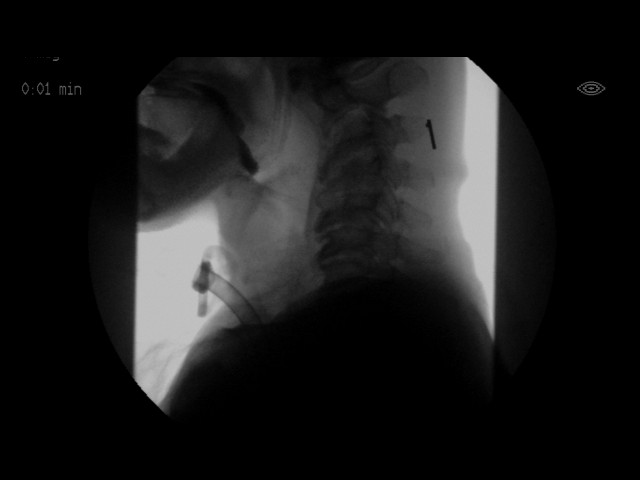
[im 2/7]
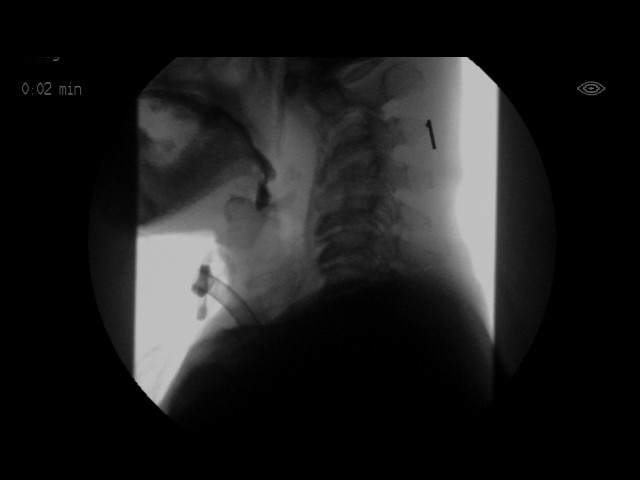
[im 2/7]
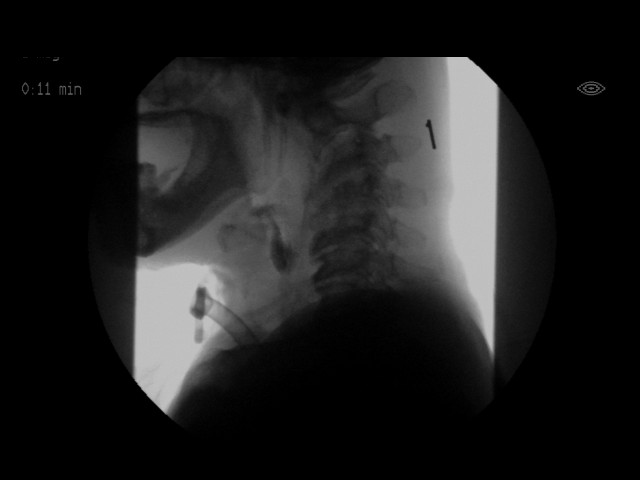
[im 3/7]
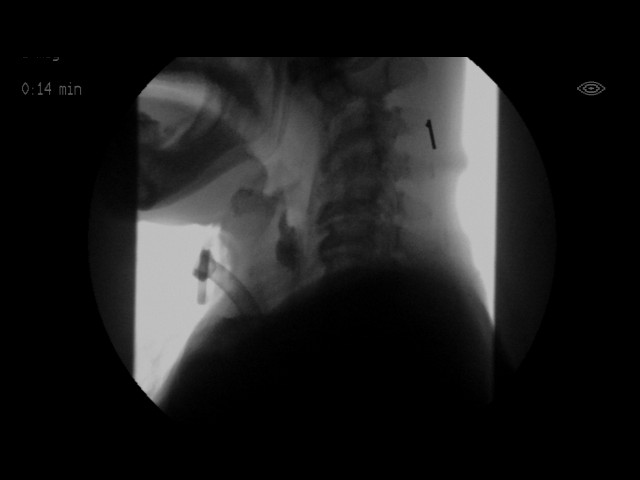
[im 3/7]
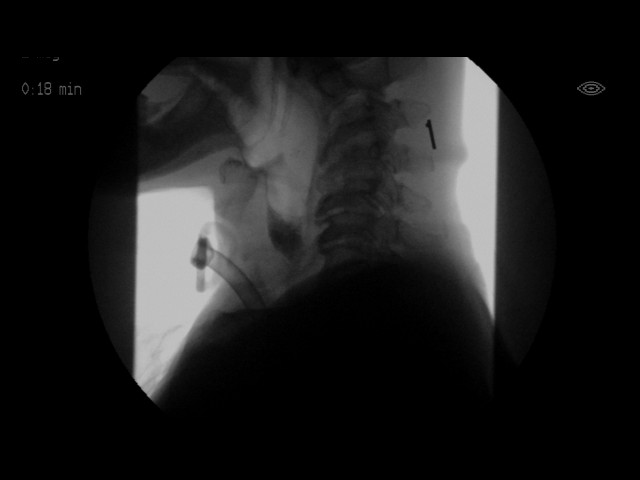
[im 4/7]
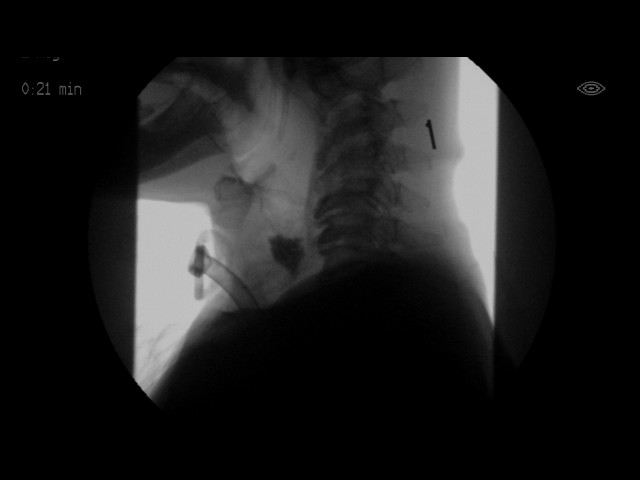
[im 4/7]
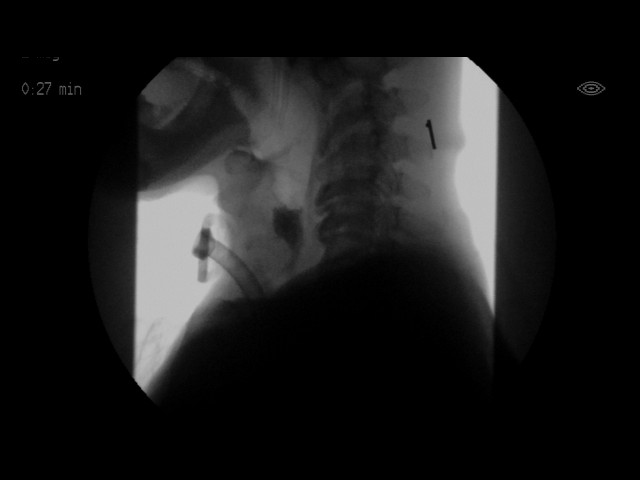
[im 4/7]
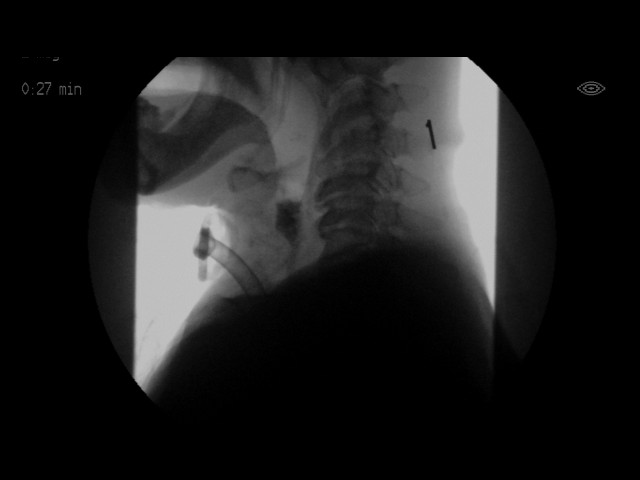
[im 5/7]
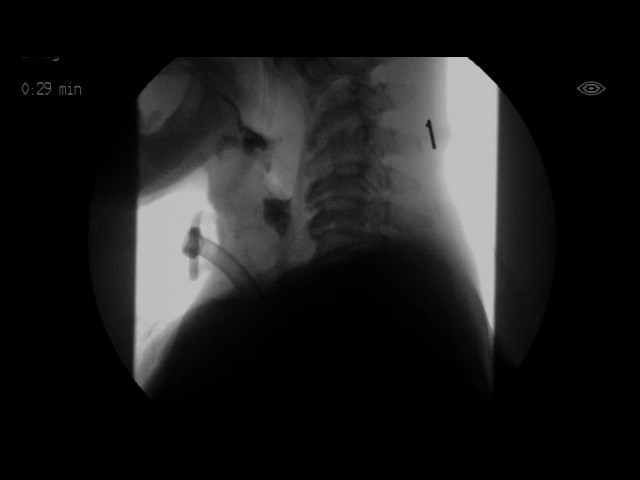
[im 5/7]
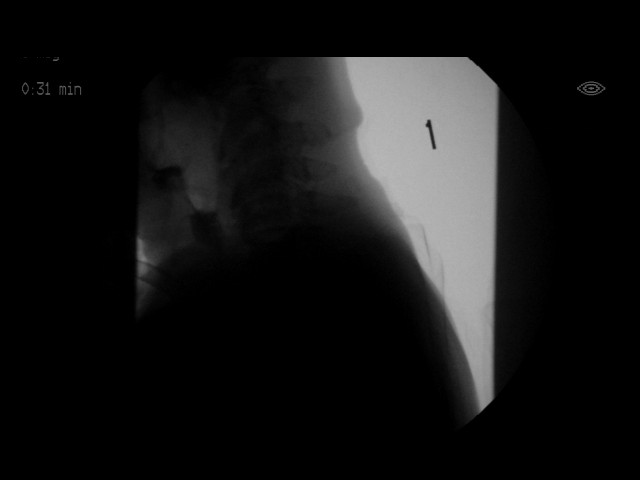
[im 6/7]
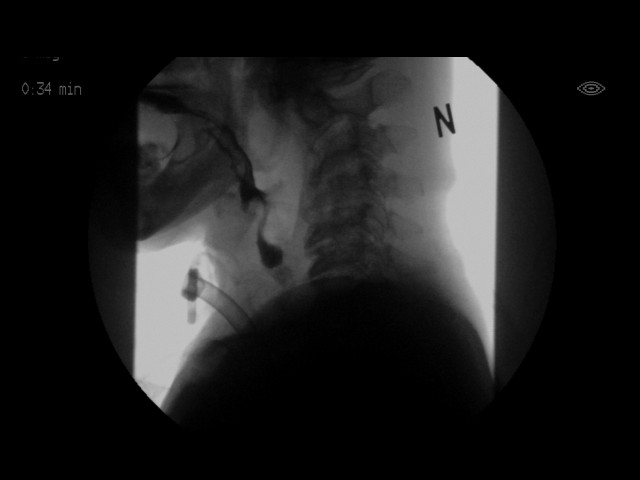
[im 6/7]
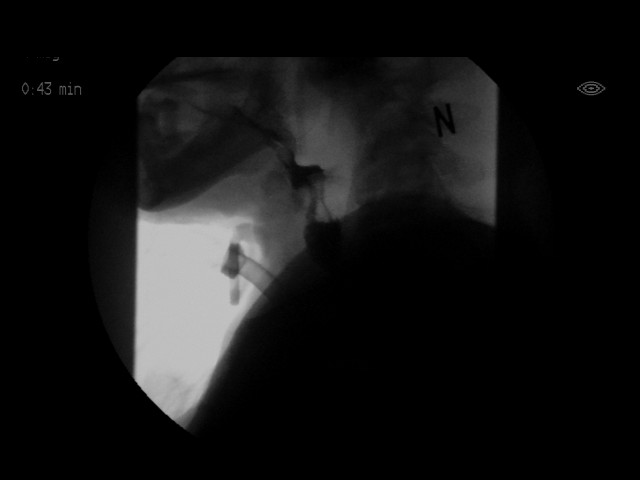
[im 6/7]
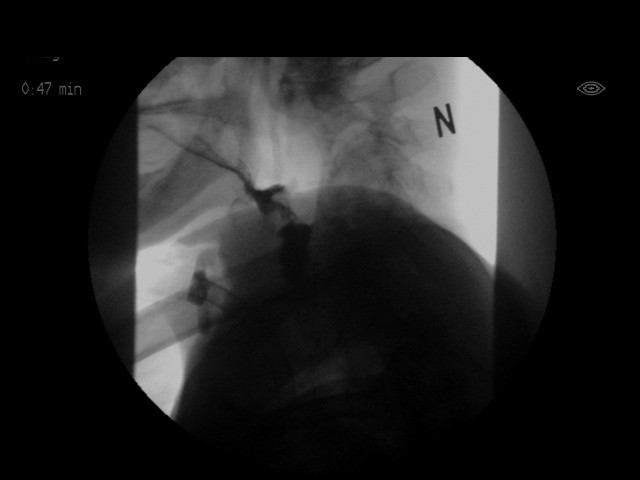
[im 7/7]
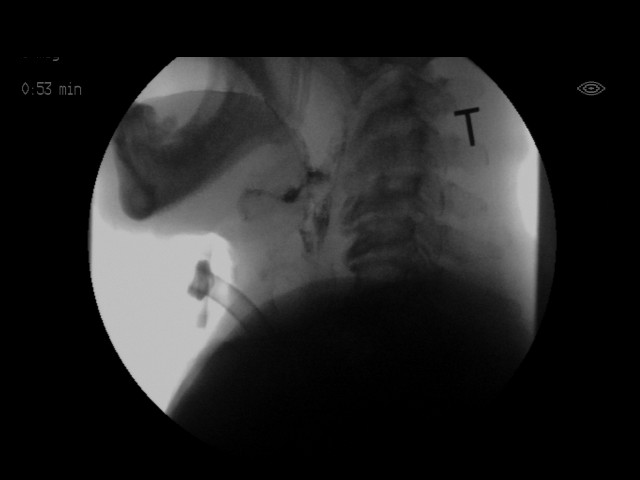
[im 7/7]
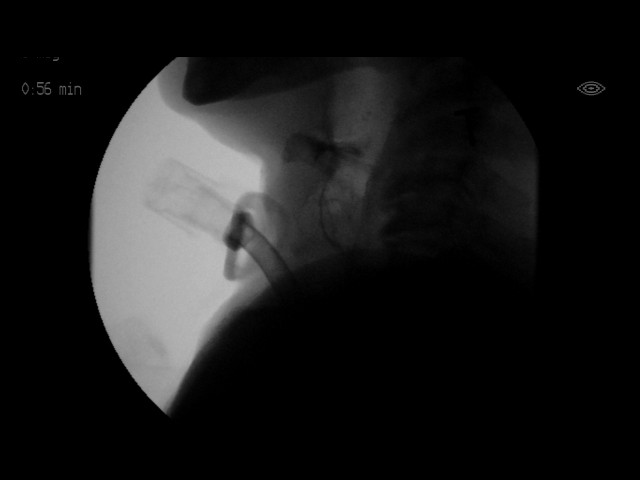

[17 of 24 positions shown; findings below may reference images not displayed]

FINDINGS: Thin liquid-aspiration noted.  Retention noted.

Nectar thick liquid-No aspiration. Delayed swallow trigger and
retention noted.

Honey-not evaluated

Pure-delayed swallow trigger.  Retention noted.

Cracker-not evaluated.

Pure with cracker-not evaluated.

Barium tablet-not evaluated.
IMPRESSION: 1. Aspiration of thin liquid.
2. Significantly delayed swallow trigger with nectar thick and Pure
3. Retention noted with thin liquid, nectar and Pure

Please refer to the Speech Pathologists report for complete details
and recommendations.

## 2018-11-09 IMAGING — DX DG ABDOMEN ACUTE W/ 1V CHEST
4 series · 4 of 4 positions shown · non-contrast
Comparison: Chest x-ray 09/25/2017

CLINICAL DATA: Vomiting

EXAM:
DG ABDOMEN ACUTE W/ 1V CHEST

[abdomen erect]
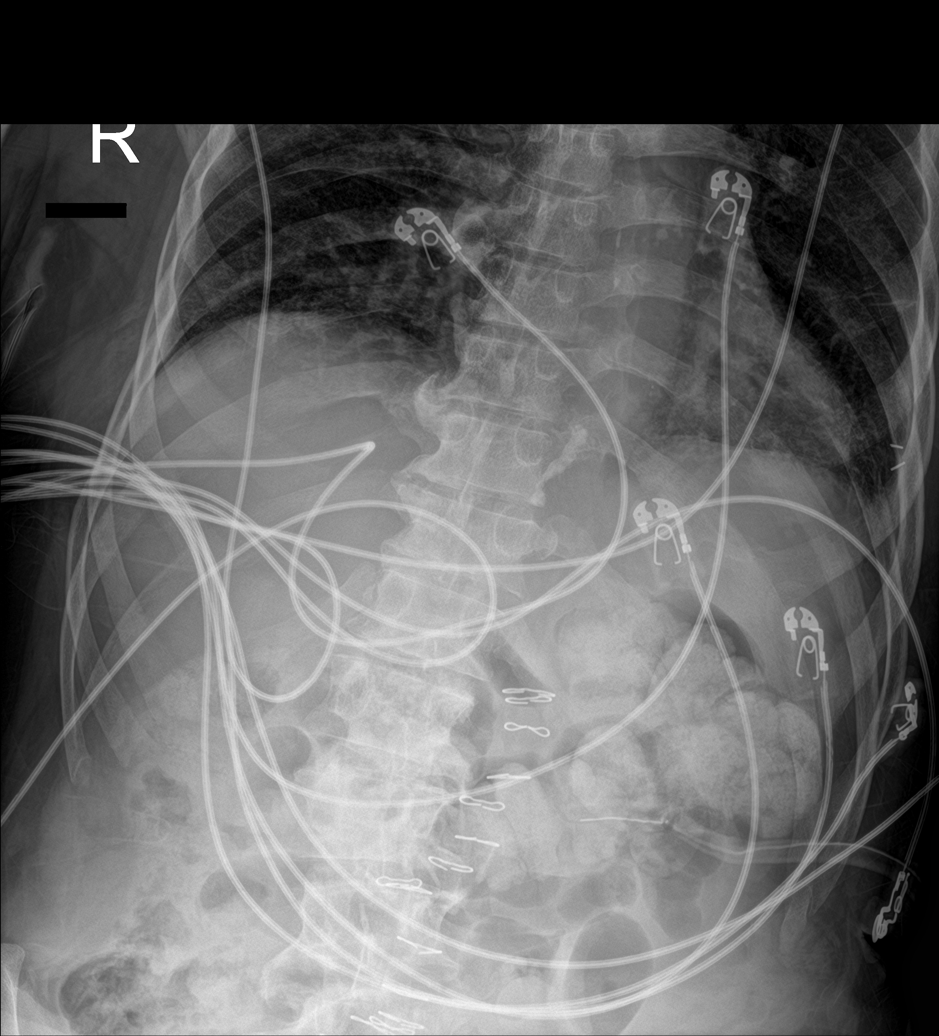

[abdomen supine (1 of 2)]
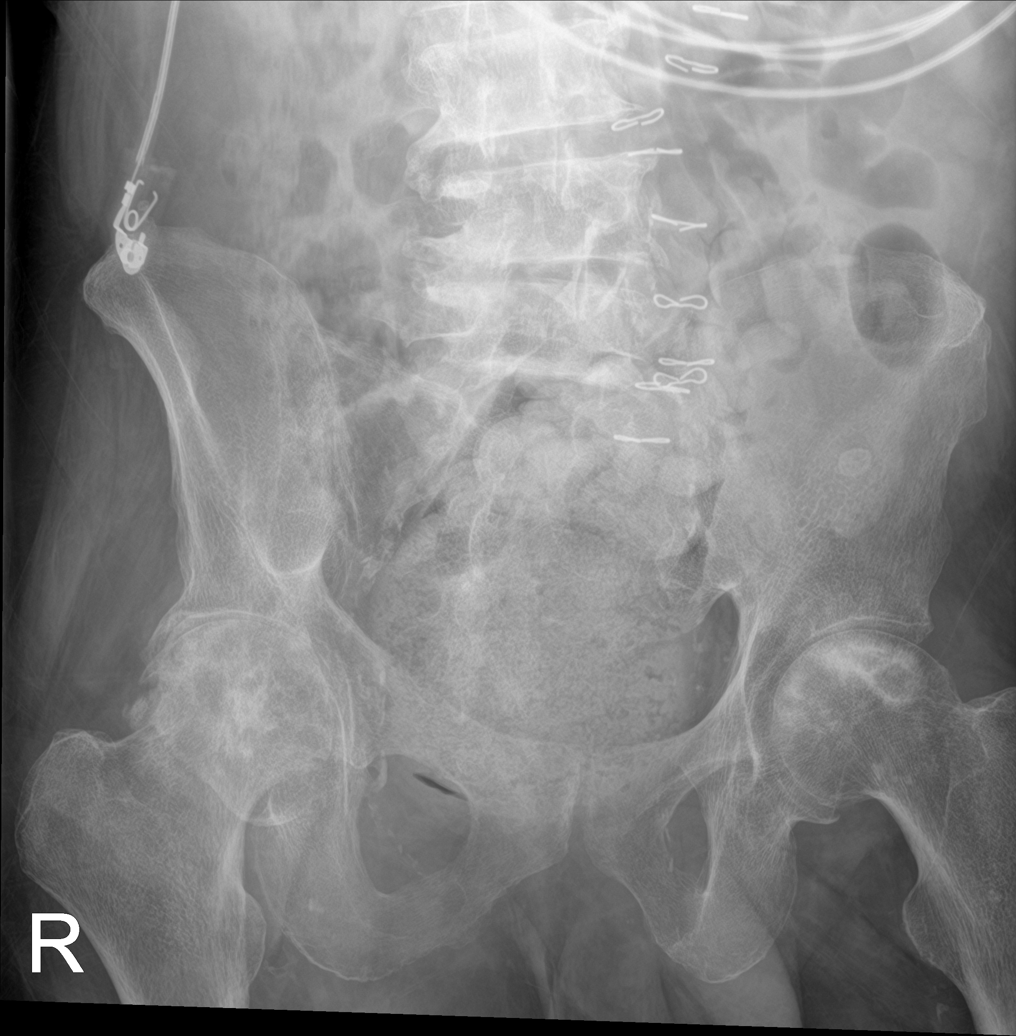

[abdomen supine (2 of 2)]
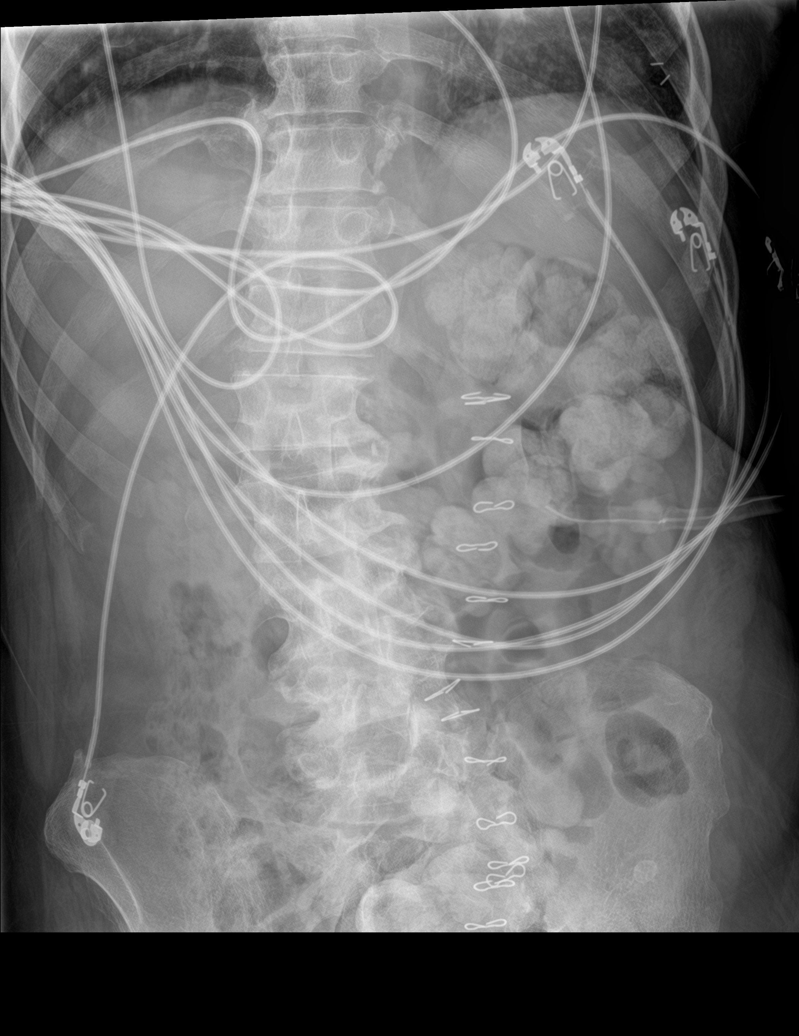

[chest ap]
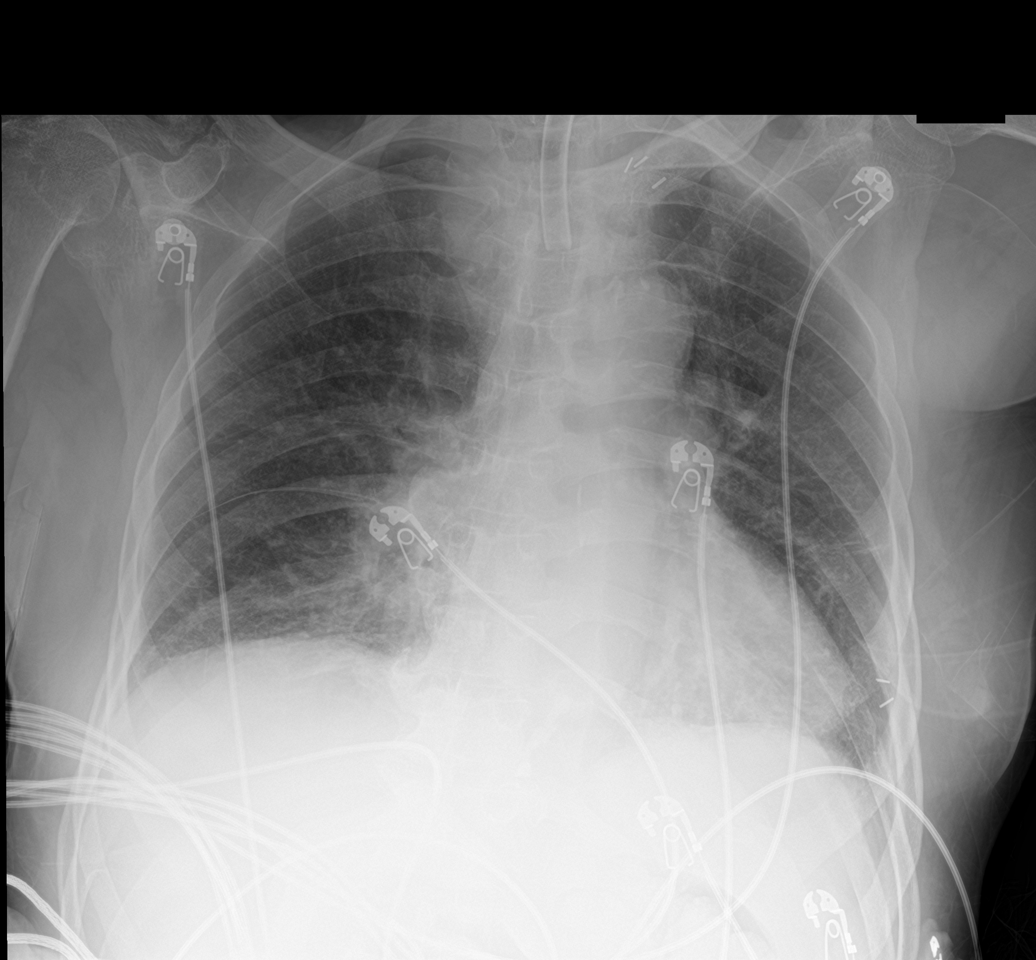

[4 of 4 positions shown; findings below may reference images not displayed]

FINDINGS: Tracheostomy is unchanged. Mild cardiomegaly and vascular
congestion. Bibasilar atelectasis.

Gastrostomy projects over the left abdomen. Large stool burden in
the colon. No evidence of bowel obstruction or free air. No
organomegaly
IMPRESSION: Large stool burden.

No obstruction or free air.

Cardiomegaly, vascular congestion and bibasilar atelectasis.

## 2018-11-18 IMAGING — US US CHEST/MEDIASTINUM
1 series · 5 of 5 positions shown · non-contrast
Comparison: CT 12/29/2016

CLINICAL DATA: Left pleural effusion.  Thoracentesis requested.

EXAM:
CHEST ULTRASOUND

[Series 1: us chest/mediastinum · 0.19mm/px · 5 of 5 slices shown]
[im 1/5]
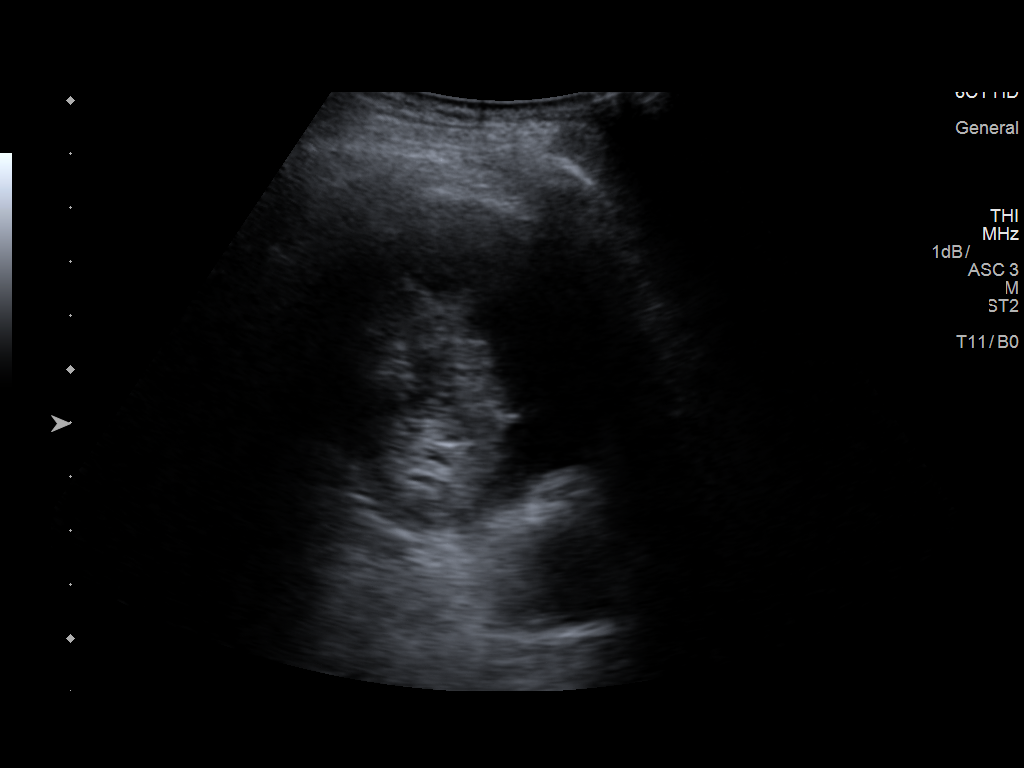
[im 2/5]
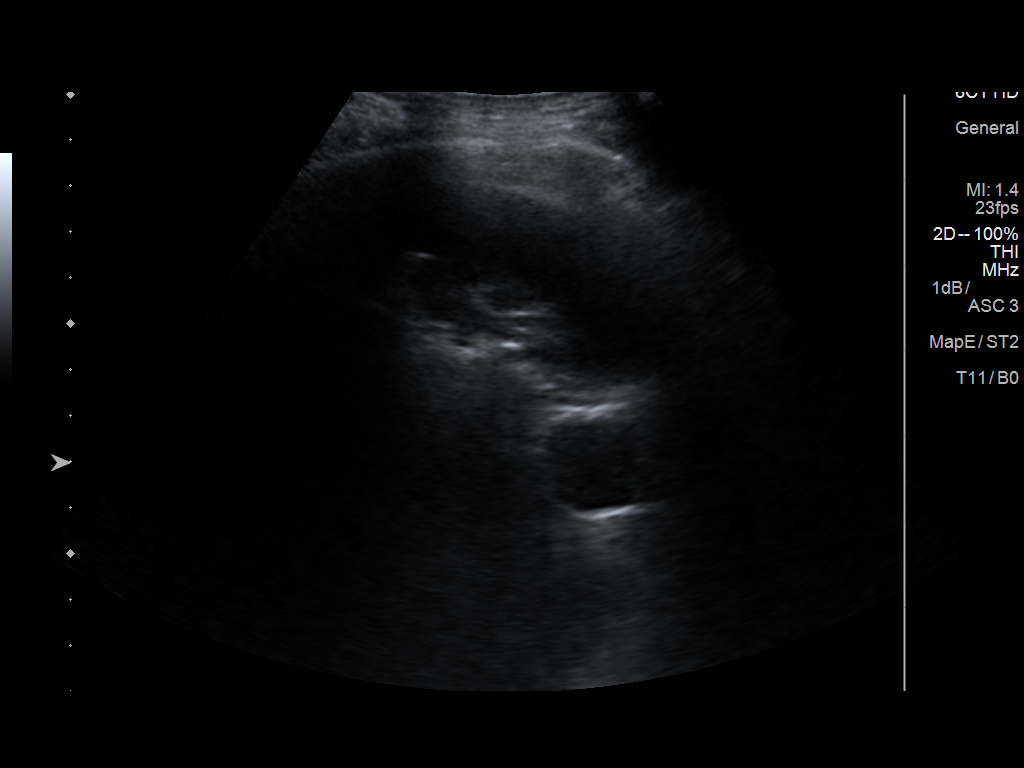
[im 3/5]
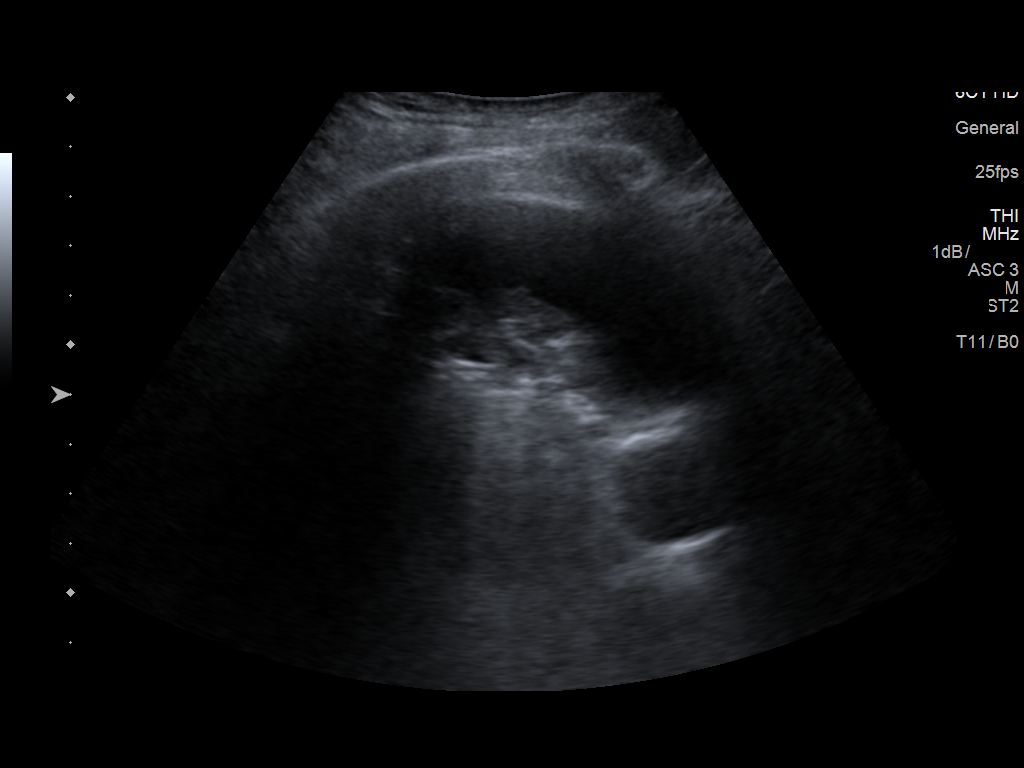
[im 4/5]
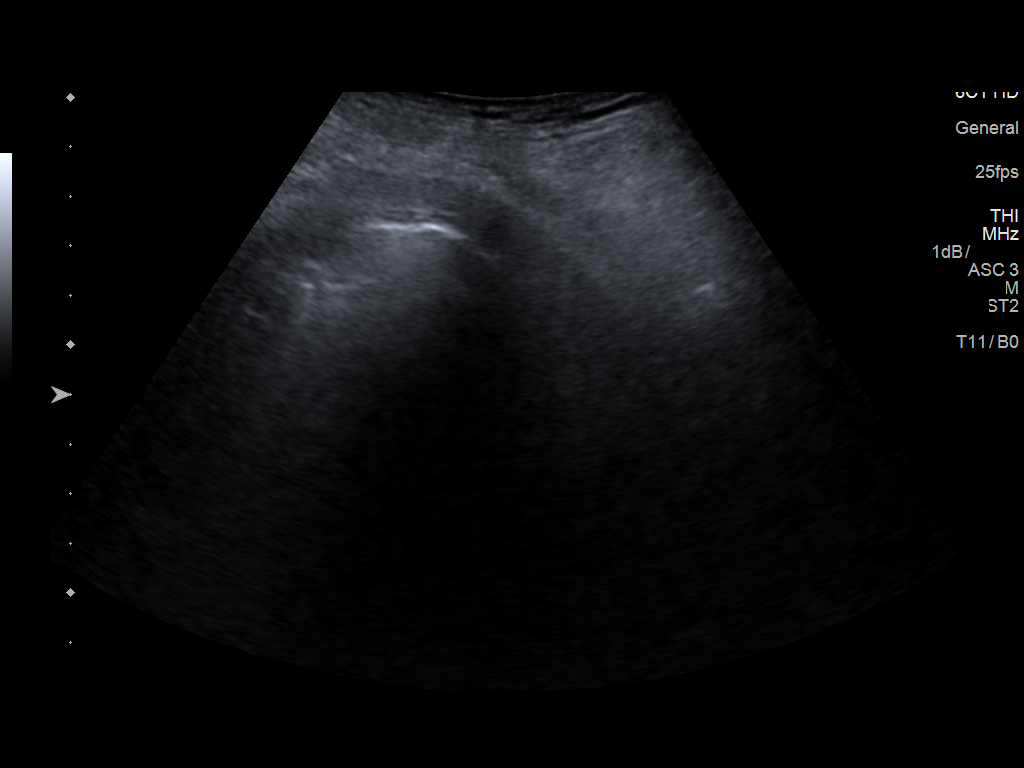
[im 5/5]
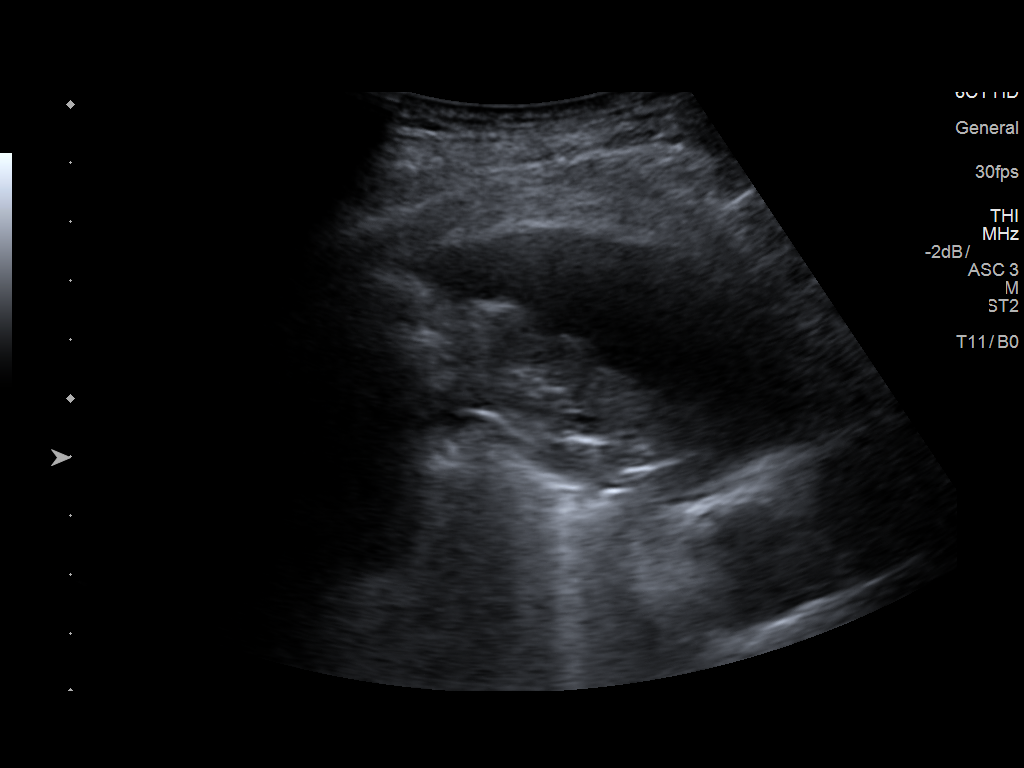

[5 of 5 positions shown; findings below may reference images not displayed]

FINDINGS: A small loculated left pleural effusion was identified. After
discussion with Dr. Edrissa regarding the findings, thoracentesis
was deferred.
IMPRESSION: 1. Small loculated left pleural effusion. Thoracentesis deferred.
Consider CT guided aspiration/drainage if clinically indicated.

## 2018-11-19 ENCOUNTER — Other Ambulatory Visit: Payer: Self-pay

## 2018-11-19 ENCOUNTER — Emergency Department (HOSPITAL_COMMUNITY)
Admission: EM | Admit: 2018-11-19 | Discharge: 2018-11-19 | Disposition: A | Discharge status: Home / Self Care | Payer: Medicare HMO | Attending: Emergency Medicine | Admitting: Emergency Medicine

## 2018-11-19 ENCOUNTER — Emergency Department (HOSPITAL_COMMUNITY): Payer: Medicare HMO

## 2018-11-19 ENCOUNTER — Encounter (HOSPITAL_COMMUNITY): Payer: Self-pay

## 2018-11-19 DIAGNOSIS — Y712 Prosthetic and other implants, materials and accessory cardiovascular devices associated with adverse incidents: Secondary | ICD-10-CM | POA: Diagnosis not present

## 2018-11-19 DIAGNOSIS — K5900 Constipation, unspecified: Secondary | ICD-10-CM

## 2018-11-19 DIAGNOSIS — T82898A Other specified complication of vascular prosthetic devices, implants and grafts, initial encounter: Secondary | ICD-10-CM | POA: Insufficient documentation

## 2018-11-19 DIAGNOSIS — R112 Nausea with vomiting, unspecified: Secondary | ICD-10-CM | POA: Diagnosis not present

## 2018-11-19 DIAGNOSIS — Z930 Tracheostomy status: Secondary | ICD-10-CM | POA: Diagnosis not present

## 2018-11-19 DIAGNOSIS — K9423 Gastrostomy malfunction: Secondary | ICD-10-CM | POA: Insufficient documentation

## 2018-11-19 DIAGNOSIS — N182 Chronic kidney disease, stage 2 (mild): Secondary | ICD-10-CM | POA: Insufficient documentation

## 2018-11-19 DIAGNOSIS — Z87891 Personal history of nicotine dependence: Secondary | ICD-10-CM | POA: Insufficient documentation

## 2018-11-19 DIAGNOSIS — I129 Hypertensive chronic kidney disease with stage 1 through stage 4 chronic kidney disease, or unspecified chronic kidney disease: Secondary | ICD-10-CM | POA: Insufficient documentation

## 2018-11-19 LAB — COMPREHENSIVE METABOLIC PANEL
ALT: 20 U/L (ref 0–44)
AST: 21 U/L (ref 15–41)
Albumin: 2.9 g/dL — ABNORMAL LOW (ref 3.5–5.0)
Alkaline Phosphatase: 79 U/L (ref 38–126)
Anion gap: 9 (ref 5–15)
BUN: 28 mg/dL — ABNORMAL HIGH (ref 8–23)
CO2: 27 mmol/L (ref 22–32)
Calcium: 9.8 mg/dL (ref 8.9–10.3)
Chloride: 101 mmol/L (ref 98–111)
Creatinine, Ser: 1.09 mg/dL (ref 0.61–1.24)
GFR calc Af Amer: >60 mL/min (ref >60)
GFR calc non Af Amer: >60 mL/min (ref >60)
Glucose, Bld: 93 mg/dL (ref 70–99)
Potassium: 5 mmol/L (ref 3.5–5.1)
Sodium: 137 mmol/L (ref 135–145)
Total Bilirubin: 0.3 mg/dL (ref 0.3–1.2)
Total Protein: 7.5 g/dL (ref 6.5–8.1)

## 2018-11-19 LAB — CBC WITH DIFFERENTIAL/PLATELET
Abs Immature Granulocytes: 0.06 10*3/uL (ref 0.00–0.07)
Basophils Absolute: 0.1 10*3/uL (ref 0.0–0.1)
Basophils Relative: 1 %
Eosinophils Absolute: 0.3 10*3/uL (ref 0.0–0.5)
Eosinophils Relative: 3 %
HCT: 40.1 % (ref 39.0–52.0)
Hemoglobin: 12.3 g/dL — ABNORMAL LOW (ref 13.0–17.0)
Immature Granulocytes: 1 %
Lymphocytes Relative: 15 %
Lymphs Abs: 1.9 10*3/uL (ref 0.7–4.0)
MCH: 29.9 pg (ref 26.0–34.0)
MCHC: 30.7 g/dL (ref 30.0–36.0)
MCV: 97.6 fL (ref 80.0–100.0)
Monocytes Absolute: 1.1 10*3/uL — ABNORMAL HIGH (ref 0.1–1.0)
Monocytes Relative: 9 %
Neutro Abs: 9.1 10*3/uL — ABNORMAL HIGH (ref 1.7–7.7)
Neutrophils Relative %: 71 %
Platelets: 430 10*3/uL — ABNORMAL HIGH (ref 150–400)
RBC: 4.11 MIL/uL — ABNORMAL LOW (ref 4.22–5.81)
RDW: 13.5 % (ref 11.5–15.5)
WBC: 12.5 10*3/uL — ABNORMAL HIGH (ref 4.0–10.5)
nRBC: 0 % (ref 0.0–0.2)

## 2018-11-19 LAB — LIPASE, BLOOD: Lipase: 22 U/L (ref 11–51)

## 2018-11-19 MED ORDER — POLYETHYLENE GLYCOL 3350 17 G PO PACK: 17.0000 g | PACK | Freq: Every day | ORAL | 0 refills | Status: DC

## 2018-11-19 NOTE — ED Notes (Signed)
EDP at bedside for g tube change and possible [place] IV

## 2018-11-19 NOTE — ED Notes (Signed)
IV at bedside for another attempt

## 2018-11-19 NOTE — ED Notes (Signed)
Notified pt that [facility] was contacted for transport.

## 2018-11-19 NOTE — ED Notes (Signed)
Notified by CT that IV infiltrated. Notified EDP. Pt would not allow me to remove IV or to apply a warm pack. Pt may be asking to go home, but he doesn't speak so I am not sur of his request.  Will ask EDP to see pt.

## 2018-11-19 NOTE — ED Triage Notes (Signed)
[facility] reports pt seen at [facility] yesterday to hae g tube replaced. Xray revealed blockage, pt was sent home. Sister insisted he come here today to be evaluated for incorrect g tube and coughing up bile.  Pt on 2lpm

## 2018-11-19 NOTE — ED Notes (Signed)
Another line attempted unsuccessfully.

## 2018-11-19 NOTE — ED Notes (Signed)
Called CT to let them know pt has line and  is ready for scan.

## 2018-11-19 NOTE — ED Notes (Signed)
Patient verbalizes understanding of discharge instructions. Opportunity for questioning and answers were provided. Armband removed by staff, pt discharged from ED.  

## 2018-11-19 NOTE — ED Notes (Signed)
Pt sister [name] called and I provided updates.

## 2018-11-19 NOTE — Discharge Instructions (Addendum)
Continue your current medications.  CT scan showed that she have a component of constipation, take the stool softeners as prescribed  The IV did Infiltrate in your arm.  This should resolve on its own but apply warm compresses to help with the discomfort

## 2018-11-19 NOTE — ED Notes (Signed)
Pt alert and waiting for transport. SpO2 99%. [facility] indicates they are en route for pt.

## 2018-11-19 NOTE — ED Notes (Signed)
IV team unable to get a line, they are sending a team mate to attempt line.

## 2018-11-19 NOTE — ED Notes (Signed)
IV team at bedside 

## 2018-11-19 NOTE — Evaluation (Signed)
Radiologist was notified via telephone of contrast extravasation during CT scan as the technologist noted no intravenous contrast on the initial scan. Assessment of the patient reveals an IV in the right upper extremity at the level of the bicep. Localized swelling was palpated at the IV site. All compartments remain soft and compressible. Intact distal pulses. Patient is non verbal but nods to pain in the extremity. Patient was given a warm compress and transport called to return patient to the ED.  Davina Poke, DO Radiology

## 2018-11-19 NOTE — ED Provider Notes (Signed)
Emergency Department Provider Note   I have reviewed the triage vital signs and the nursing notes.   HISTORY  Chief Complaint G tube and coughing up bile   HPI Scott Rivera is a 68 y.o. male with past medical history noted below presents to the emergency department for G-tube replacement.  He was sent in by his sister.  Patient is awake and alert.  He is participating in the history but due to his trach dependence is nonverbal.  He denies any pain by shaking his head.  In speaking with the sister by phone, she states that his G-tube fell out yesterday and he presented to the emergency department for replacement.  They placed a Foley catheter.  He typically uses an 48 [place] G-tube which has been in place for many months.  She noted some [place]/dark brown emesis multiple times over the last 24 hours.  No diarrhea.  Patient denies abdominal pain.  He denies any chest pain or shortness of breath.  Level 5 caveat: Non-verbal with trach  Past Medical History:  Diagnosis Date   Acute pulmonary edema (HCC)    Anemia due to chronic kidney disease    ARF (acute renal failure) ([place]) 10/19/2016   Aspiration pneumonia (HCC)    Cerebellar infarct ([place]) 10/19/2016   Chronic kidney disease, stage 3 (moderate) (HCC)    CKD (chronic kidney disease) stage 2, GFR 60-89 ml/min 01/02/2017   Disease characterized by destruction of skeletal muscle 10/15/2016   Gastrostomy tube obstruction (HCC)    HCAP (healthcare-associated pneumonia)    Helicobacter pylori infection 09/21/2012   History of adenomatous polyp of colon 07/28/2015   Hypertension    MDR Acinetobacter baumannii infection    Pneumothorax, closed, traumatic, initial encounter 12/10/2016   Renal insufficiency    Rhabdomyolysis 10/19/2016   S/P percutaneous endoscopic gastrostomy (PEG) tube placement ([place]) 03/30/2017   Stage III pressure ulcer of sacral region ([place]) 12/17/2016   Stroke ([place])    UTI (urinary tract infection)  03/30/2017    Patient Active Problem List   Diagnosis Date Noted   Pneumoperitoneum 04/09/2018   Perforated viscus 04/09/2018   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   Aspergillus pneumonia (HCC)    Aspiration of gastric contents    Malnutrition of moderate degree 11/19/2017   Aspiration pneumonia of right lower lobe ([place]) 11/16/2017   Nausea and vomiting 11/16/2017   Tracheostomy tube present ([place]) 09/25/2017   Chronic respiratory insufficiency 07/24/2017   Hematuria 06/26/2017   Foley catheter in place on admission 03/30/2017   Diabetes mellitus ([place]) 03/14/2017   History of tracheostomy 03/14/2017   Percutaneous endoscopic gastrostomy status ([place]) 03/14/2017   Gastroesophageal reflux disease without esophagitis 03/14/2017   Acute respiratory failure with hypoxia ([place]) 03/02/2017   Loculated pleural effusion    CKD (chronic kidney disease) stage 2, GFR 60-89 ml/min 01/02/2017   Anemia due to chronic kidney disease 12/09/2016   Compensated metabolic alkalosis 96/29/5284   Goals of care, counseling/discussion    Palliative care by specialist    Aspiration into airway    Dysphagia, post-stroke    Tachypnea    Leukocytosis    Acute blood loss anemia    Pressure injury of skin 10/20/2016   Essential hypertension 10/19/2016   History of cerebral infarction 10/19/2016   Bilateral chronic knee pain 01/07/2016   Tobacco abuse 12/23/2014   Mixed hyperlipidemia 07/28/2013    Past Surgical History:  Procedure Laterality Date   IR GASTROSTOMY TUBE MOD SED  10/27/2016  IR PATIENT EVAL TECH 0-60 MINS  01/05/2017   IR PATIENT EVAL TECH 0-60 MINS  01/09/2017   IR PATIENT EVAL TECH 0-60 MINS  03/03/2017   IR PATIENT EVAL TECH 0-60 MINS  04/03/2017   IR REPLACE G-TUBE SIMPLE WO FLUORO  11/20/2017   IR REPLC GASTRO/COLONIC TUBE PERCUT W/FLUORO  07/23/2017   KNEE SURGERY     TRACHEOSTOMY TUBE PLACEMENT      Allergies Patient has no known  allergies.  Family History  Problem Relation Age of Onset   Diabetes Mellitus II Brother    CAD Neg Hx    Stroke Neg Hx     Social History Social History   Tobacco Use   Smoking status: Former Smoker   Smokeless tobacco: Never Used   Tobacco comment: Smoked heavily until the day of his stroke  Substance Use Topics   Alcohol use: Not Currently    Comment: Drank heavily prior to stroke per sister   Drug use: Not Currently    Review of Systems  Constitutional: No fever/chills Cardiovascular: Denies chest pain. Respiratory: Denies shortness of breath. Gastrointestinal: No abdominal pain. Positive nausea and vomiting.  No diarrhea.  Genitourinary: Negative for dysuria. Musculoskeletal: Negative for back pain. Skin: Negative for rash. Neurological: Negative for headaches  10-point ROS otherwise negative.  ____________________________________________   PHYSICAL EXAM:  VITAL SIGNS: ED Triage Vitals [11/19/18 1146]  Enc Vitals Group     BP 132/76     Pulse Rate 65     Resp 17     Temp 98 F (36.7 C)     Temp Source Oral     SpO2 95 %     Weight 160 lb (72.6 kg)     Height 5\' 6"  (1.676 m)   Constitutional: Alert. Well appearing and in no acute distress. Eyes: Conjunctivae are normal. Head: Atraumatic. Nose: No congestion/rhinnorhea. Mouth/Throat: Mucous membranes are moist.  Neck: No stridor. Cardiovascular: Normal rate, regular rhythm Respiratory: Normal respiratory effort.  Gastrointestinal: Soft and nontender. No distention. Foley catheter in the G-tube ostomy. No surrounding erythema or drainage. No bleeding.  Musculoskeletal: No gross deformities of extremities. Neurologic: Awake and alert.  Skin:  Skin is warm, dry and intact. No rash noted.  ____________________________________________   LABS (all labs ordered are listed, but only abnormal results are displayed)  Labs Reviewed  COMPREHENSIVE METABOLIC PANEL - Abnormal; Notable for the  following components:      Result Value   BUN 28 (*)    Albumin 2.9 (*)    All other components within normal limits  CBC WITH DIFFERENTIAL/PLATELET - Abnormal; Notable for the following components:   WBC 12.5 (*)    RBC 4.11 (*)    Hemoglobin 12.3 (*)    Platelets 430 (*)    Neutro Abs 9.1 (*)    Monocytes Absolute 1.1 (*)    All other components within normal limits  LIPASE, BLOOD   ____________________________________________  RADIOLOGY  Ct Abdomen Pelvis Wo Contrast  Result Date: 11/19/2018 CLINICAL DATA:  Nausea vomiting and diffuse abdominal pain. EXAM: CT ABDOMEN AND PELVIS WITHOUT CONTRAST TECHNIQUE: Multidetector CT imaging of the abdomen and pelvis was performed following the standard protocol without IV contrast. COMPARISON:  April 12, 2018 FINDINGS: Lower chest: Mild bibasilar atelectasis versus airspace consolidation. Hepatobiliary: No focal liver abnormality is seen. No gallstones, gallbladder wall thickening, or biliary dilatation. Pancreas: Atrophic pancreas. Spleen: Normal in size without focal abnormality. Adrenals/Urinary Tract: Tiny atrophic left kidney. The right kidney, bilateral adrenal  glands, right ureter are normal. Urinary bladder is decompressed around urinary Foley. Stomach/Bowel: Stomach is within normal limits. Gastrostomy tube in place. No evidence of bowel wall thickening, distention, or inflammatory changes. Formed stool within the rectum which measures 8.7 cm. Vascular/Lymphatic: Aortic atherosclerosis. No enlarged abdominal or pelvic lymph nodes. Reproductive: Prostate is unremarkable. Other: No abdominal wall hernia or abnormality. No abdominopelvic ascites. Musculoskeletal: Spondylosis of the lumbosacral spine. Osteoarthritic changes versus avascular necrosis of the right hip. 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 IMPRESSION: 1. Mild bibasilar atelectasis versus airspace consolidation. 2. No evidence of acute abnormalities within the solid abdominal  organs. 3. Formed stool within the rectum which is distended to 8.7 cm. 4. Osteoarthritic changes versus avascular necrosis of the right hip. 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 Aortic Atherosclerosis (ICD10-I70.0). Electronically Signed   By: Fidela Salisbury M.D.   On: 11/19/2018 17:29    ____________________________________________   PROCEDURES  Procedure(s) performed:   Gastrostomy tube replacement  Date/Time: 11/19/2018 6:56 PM Performed by: Margette Fast, MD Authorized by: Margette Fast, MD  Consent: Verbal consent obtained. Risks and benefits: risks, benefits and alternatives were discussed Consent given by: patient (and sister by phone) Patient understanding: patient states understanding of the procedure being performed Required items: required blood products, implants, devices, and special equipment available Patient identity confirmed: verbally with patient Time out: Immediately prior to procedure a "time out" was called to verify the correct patient, procedure, equipment, support staff and site/side marked as required. Preparation: Patient was prepped and draped in the usual sterile fashion. Local anesthesia used: no  Anesthesia: Local anesthesia used: no  Sedation: Patient sedated: no  Patient tolerance: patient tolerated the procedure well with no immediate complications Comments: Foley catheter removed without difficulty. 44F G tube placed immediately into the well-formed stoma. No pain. No significant resistant. No bleeding. Gastric contents able to be aspirated. 10 ml of sterile saline used to inflate balloon.       ____________________________________________   INITIAL IMPRESSION / ASSESSMENT AND PLAN / ED COURSE  Pertinent labs & imaging results that were available during my care of the patient were reviewed by me and considered in my medical decision making (see chart for details).   Patient presents to the emergency department with nausea  and vomiting along with Foley catheter holding the place of his G-tube. Sister notes nausea and vomiting over the last 24 hours. No abdominal distension here in the ED. No clinical signs of obstruction. Foley removed and G-tube replaced without complication.   Labs reviewed. CT pending with history of emesis to r/o SBO vs volvulous. Care transferred to Dr. Tomi Bamberger. Can confirm placement of G-tube clinically but will confirm on CT as well.    ____________________________________________  FINAL CLINICAL IMPRESSION(S) / ED DIAGNOSES  Final diagnoses:  Constipation, unspecified constipation type  Extravasation injury of IV catheter site with other complication, initial encounter ([place])  Gastrostomy tube dysfunction ([place])     NEW OUTPATIENT MEDICATIONS STARTED DURING THIS VISIT:  New Prescriptions   POLYETHYLENE GLYCOL (MIRALAX) 17 G PACKET    Take 17 g by mouth daily.    Note:  This document was prepared using Dragon voice recognition software and may include unintentional dictation errors.  Nanda Quinton, MD Emergency Medicine    Wadie Liew, [facility], MD 11/19/18 1901

## 2018-11-19 NOTE — ED Notes (Signed)
EDP unable to get IV with [place], IV team to follow.

## 2018-11-19 NOTE — ED Notes (Signed)
PT[facility] called @ 1839-per Elizabeth,RN called by Levada Dy

## 2018-11-19 NOTE — ED Notes (Signed)
IV attempted. Will try again and put in consult as a back up

## 2018-11-19 NOTE — ED Notes (Signed)
Patient returned to CT.

## 2018-11-19 NOTE — ED Notes (Signed)
Called pt sister to ask about picking pt up and to go over discharge instruction. Pt requires PTAR for transport

## 2018-11-19 NOTE — ED Notes (Signed)
Called RT to assess this pt.

## 2018-11-24 ENCOUNTER — Other Ambulatory Visit: Payer: Self-pay

## 2018-11-24 ENCOUNTER — Emergency Department (HOSPITAL_COMMUNITY): Payer: Medicare HMO

## 2018-11-24 ENCOUNTER — Emergency Department (HOSPITAL_COMMUNITY)
Admission: EM | Admit: 2018-11-24 | Discharge: 2018-11-24 | Disposition: A | Discharge status: Home / Self Care | Payer: Medicare HMO | Attending: Emergency Medicine | Admitting: Emergency Medicine

## 2018-11-24 ENCOUNTER — Encounter (HOSPITAL_COMMUNITY): Payer: Self-pay

## 2018-11-24 DIAGNOSIS — Z8673 Personal history of transient ischemic attack (TIA), and cerebral infarction without residual deficits: Secondary | ICD-10-CM | POA: Insufficient documentation

## 2018-11-24 DIAGNOSIS — I129 Hypertensive chronic kidney disease with stage 1 through stage 4 chronic kidney disease, or unspecified chronic kidney disease: Secondary | ICD-10-CM | POA: Diagnosis not present

## 2018-11-24 DIAGNOSIS — Z79899 Other long term (current) drug therapy: Secondary | ICD-10-CM | POA: Diagnosis not present

## 2018-11-24 DIAGNOSIS — R1110 Vomiting, unspecified: Secondary | ICD-10-CM | POA: Insufficient documentation

## 2018-11-24 DIAGNOSIS — N183 Chronic kidney disease, stage 3 (moderate): Secondary | ICD-10-CM | POA: Insufficient documentation

## 2018-11-24 DIAGNOSIS — Z87891 Personal history of nicotine dependence: Secondary | ICD-10-CM | POA: Insufficient documentation

## 2018-11-24 LAB — CBC WITH DIFFERENTIAL/PLATELET
Abs Immature Granulocytes: 0.05 10*3/uL (ref 0.00–0.07)
Basophils Absolute: 0 10*3/uL (ref 0.0–0.1)
Basophils Relative: 0 %
Eosinophils Absolute: 0.3 10*3/uL (ref 0.0–0.5)
Eosinophils Relative: 3 %
HCT: 46.8 % (ref 39.0–52.0)
Hemoglobin: 14.5 g/dL (ref 13.0–17.0)
Immature Granulocytes: 0 %
Lymphocytes Relative: 14 %
Lymphs Abs: 1.6 10*3/uL (ref 0.7–4.0)
MCH: 30 pg (ref 26.0–34.0)
MCHC: 31 g/dL (ref 30.0–36.0)
MCV: 96.7 fL (ref 80.0–100.0)
Monocytes Absolute: 1.3 10*3/uL — ABNORMAL HIGH (ref 0.1–1.0)
Monocytes Relative: 12 %
Neutro Abs: 8 10*3/uL — ABNORMAL HIGH (ref 1.7–7.7)
Neutrophils Relative %: 71 %
Platelets: 443 10*3/uL — ABNORMAL HIGH (ref 150–400)
RBC: 4.84 MIL/uL (ref 4.22–5.81)
RDW: 13.8 % (ref 11.5–15.5)
WBC: 11.3 10*3/uL — ABNORMAL HIGH (ref 4.0–10.5)
nRBC: 0 % (ref 0.0–0.2)

## 2018-11-24 LAB — BASIC METABOLIC PANEL
Anion gap: 13 (ref 5–15)
BUN: 25 mg/dL — ABNORMAL HIGH (ref 8–23)
CO2: 26 mmol/L (ref 22–32)
Calcium: 9.8 mg/dL (ref 8.9–10.3)
Chloride: 98 mmol/L (ref 98–111)
Creatinine, Ser: 1.08 mg/dL (ref 0.61–1.24)
GFR calc Af Amer: >60 mL/min (ref >60)
GFR calc non Af Amer: >60 mL/min (ref >60)
Glucose, Bld: 91 mg/dL (ref 70–99)
Potassium: 4.6 mmol/L (ref 3.5–5.1)
Sodium: 137 mmol/L (ref 135–145)

## 2018-11-24 MED ORDER — ONDANSETRON HCL 4 MG PO TABS: 4.0000 mg | ORAL_TABLET | Freq: Four times a day (QID) | ORAL | 0 refills | Status: DC | PRN

## 2018-11-24 NOTE — ED Notes (Signed)
PTAR called  

## 2018-11-24 NOTE — ED Triage Notes (Signed)
Per [facility], pt from home w/ a c/o vomiting into his trach tube. Pt has been to numerous hospitals throughout the week for the same issue (including this hospital 3 times). The pt did not vomit with EMS and his O2 sats have been 96-98% with 2 lpm via trach mask. Pt denies complaint and does not want to be here. He is non-verbal.   134/74 HR 88 RR 16 96-98% on 2 lpm via trach mask

## 2018-11-24 NOTE — ED Notes (Signed)
Sister, Sherre Poot [phone]; He's been throwing up, brown dark brown 5x days ; went to [facility] to get [facility] T[facility]; bowel in his stomach....coffee grounds.

## 2018-11-24 NOTE — ED Triage Notes (Signed)
Pt has had multiple referrals to GI specialists though family has not made any appts until today.

## 2018-11-24 NOTE — ED Provider Notes (Signed)
[facility] EMERGENCY DEPARTMENT Provider Note   CSN: [id number] Arrival date & time: 11/24/18  1623     History   Chief Complaint Chief Complaint  Patient presents with  . Emesis    HPI Scott Rivera is a 68 y.o. male.  Extensive past medical history including CVA, CKD, G-tube dependence, trach dependence presents emergency department with concern for dark brown emesis.  Patient nonverbal due to trach limiting history.  Is able to answer yes/no questions by shaking head.  Denies any acute complaints, denies any pain.  Additional history obtained from sister, primary caregiver.  Reports that since she has assumed care of the patient a couple weeks ago he has been having frequent episodes of spitting up or vomiting Shad/brown appearing emesis.  Happens multiple times a day.  Unsure where this comes from.  No blood.  States that feeding tube has been functioning appropriately since being replaced a few days ago.  States patient has not been having any abdominal pain, no diarrhea, no fevers, no difficulty breathing or change in oxygen settings.  Reports that her primary care doctor was going to make a referral to outpatient GI but she has not made the appointment yet.     HPI  Past Medical History:  Diagnosis Date  . Acute pulmonary edema (HCC)   . Anemia due to chronic kidney disease   . ARF (acute renal failure) ([place]) 10/19/2016  . Aspiration pneumonia ([place])   . Cerebellar infarct ([place]) 10/19/2016  . Chronic kidney disease, stage 3 (moderate) (HCC)   . CKD (chronic kidney disease) stage 2, GFR 60-89 ml/min 01/02/2017  . 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 Gastrostomy tube obstruction ([place])   . HCAP (healthcare-associated pneumonia)   . Helicobacter pylori infection 09/21/2012  . History of adenomatous polyp of colon 07/28/2015  . Hypertension   . MDR Acinetobacter baumannii infection   . Pneumothorax, closed, traumatic, initial  encounter 12/10/2016  . Renal insufficiency   . Rhabdomyolysis 10/19/2016  . S/P percutaneous endoscopic gastrostomy (PEG) tube placement ([place]) 03/30/2017  . Stage III pressure ulcer of sacral region ([place]) 12/17/2016  . Stroke ([place])   . UTI (urinary tract infection) 03/30/2017    Patient Active Problem List   Diagnosis Date Noted  . Pneumoperitoneum 04/09/2018  . Perforated viscus 04/09/2018  . 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 Aspergillus pneumonia ([place])   . Aspiration of gastric contents   . Malnutrition of moderate degree 11/19/2017  . Aspiration pneumonia of right lower lobe ([place]) 11/16/2017  . Nausea and vomiting 11/16/2017  . Tracheostomy tube present ([place]) 09/25/2017  . Chronic respiratory insufficiency 07/24/2017  . Hematuria 06/26/2017  . Foley catheter in place on admission 03/30/2017  . Diabetes mellitus ([place]) 03/14/2017  . History of tracheostomy 03/14/2017  . Percutaneous endoscopic gastrostomy status ([place]) 03/14/2017  . Gastroesophageal reflux disease without esophagitis 03/14/2017  . Acute respiratory failure with hypoxia ([place]) 03/02/2017  . Loculated pleural effusion   . CKD (chronic kidney disease) stage 2, GFR 60-89 ml/min 01/02/2017  . Anemia due to chronic kidney disease 12/09/2016  . Compensated metabolic alkalosis 93/79/0240  . Goals of care, counseling/discussion   . Palliative care by specialist   . Aspiration into airway   . Dysphagia, post-stroke   . Tachypnea   . Leukocytosis   . Acute blood loss anemia   . Pressure injury of skin 10/20/2016  . Essential hypertension 10/19/2016  . History of cerebral infarction 10/19/2016  .  Bilateral chronic knee pain 01/07/2016  . Tobacco abuse 12/23/2014  . Mixed hyperlipidemia 07/28/2013    Past Surgical History:  Procedure Laterality Date  . IR GASTROSTOMY TUBE MOD SED  10/27/2016  . IR PATIENT EVAL TECH 0-60 MINS  01/05/2017  . IR PATIENT EVAL TECH 0-60 MINS  01/09/2017  . IR PATIENT EVAL TECH  0-60 MINS  03/03/2017  . IR PATIENT EVAL TECH 0-60 MINS  04/03/2017  . IR REPLACE G-TUBE SIMPLE WO FLUORO  11/20/2017  . IR [place] TUBE PERCUT W/FLUORO  07/23/2017  . KNEE SURGERY    . TRACHEOSTOMY TUBE PLACEMENT          Home Medications    Prior to Admission medications   Medication Sig Start Date End Date Taking? Authorizing Provider  acetaminophen (TYLENOL) 500 MG tablet Place 1,000 mg into feeding tube 3 (three) times daily.     [provider]  amLODipine (NORVASC) 10 MG tablet Place 1 tablet (10 mg total) into feeding tube daily. 11/23/17   Kayleen Memos, DO  aspirin 81 MG chewable tablet Place 1 tablet (81 mg total) into feeding tube daily. Patient not taking: Reported on 11/19/2018 04/05/17   Nita Sells, MD  atropine 1 % ophthalmic solution Place 2 drops under the tongue 2 (two) times daily. Patient not taking: Reported on 11/19/2018 04/16/18   Aline August, MD  bisacodyl (DULCOLAX) 10 MG suppository Place 1 suppository (10 mg total) rectally daily. Patient not taking: Reported on 11/19/2018 12/22/17   Thurnell Lose, MD  cloNIDine (CATAPRES - DOSED IN MG/24 HR) 0.1 mg/24hr patch Place 1 patch (0.1 mg total) onto the skin every Tuesday. 04/16/18   Aline August, MD  clopidogrel (PLAVIX) 75 MG tablet Place 1 tablet (75 mg total) into feeding tube daily. 01/11/17   Cherene Altes, MD  Ferrous Sulfate (IRON) 325 (65 Fe) MG TABS Place 325 mg into feeding tube daily.     [provider]  gabapentin (NEURONTIN) 100 MG capsule 100 mg See admin instructions. Open 1 capsule (100 mg) and administer contents via feeding tube three times daily    [provider]  glycopyrrolate (ROBINUL) 1 MG tablet Place 1 mg into feeding tube 2 (two) times daily.  11/18/18   [provider]  ipratropium-albuterol (DUONEB) 0.5-2.5 (3) MG/3ML SOLN Take 3 mLs by nebulization every 4 (four) hours as needed. Patient not taking: Reported on 11/19/2018 01/10/17    Cherene Altes, MD  metoprolol tartrate (LOPRESSOR) 50 MG tablet Take 1 tablet (50 mg total) by mouth 2 (two) times daily. 12/21/17   Thurnell Lose, MD  mirtazapine (REMERON) 7.5 MG tablet Take 7.5 mg by mouth at bedtime.    [provider]  montelukast (SINGULAIR) 10 MG tablet Place 10 mg into feeding tube daily.     [provider]  Nutritional Supplements (FEEDING SUPPLEMENT, JEVITY 1.2 CAL,) LIQD Place 1,000 mLs into feeding tube continuous. Patient not taking: Reported on 11/19/2018 11/22/17   Kayleen Memos, DO  ondansetron (ZOFRAN) 4 MG tablet Place 1 tablet (4 mg total) into feeding tube every 6 (six) hours as needed for nausea. Patient not taking: Reported on 11/19/2018 04/16/18   Aline August, MD  pantoprazole sodium (PROTONIX) 40 mg/20 mL PACK Place 20 mLs (40 mg total) into feeding tube 2 (two) times daily. Patient not taking: Reported on 11/19/2018 04/16/18   Aline August, MD  polyethylene glycol (MIRALAX) 17 g packet Take 17 g by mouth daily. 11/19/18  Dorie Rank, MD  pravastatin (PRAVACHOL) 20 MG tablet Place 1 tablet (20 mg total) into feeding tube daily at 6 PM. Patient taking differently: Place 20 mg daily into feeding tube.  01/10/17   Cherene Altes, MD  thiamine 100 MG tablet Place 1 tablet (100 mg total) into feeding tube daily. Patient not taking: Reported on 11/19/2018 01/11/17   Cherene Altes, MD  traZODone (DESYREL) 50 MG tablet Place 50 mg into feeding tube at bedtime as needed for sleep. 08/25/17   [provider]  vitamin C (ASCORBIC ACID) 500 MG tablet Take 500 mg by mouth daily.    [provider]  Water For Irrigation, Sterile (FREE WATER) SOLN Place 100 mLs into feeding tube every 3 (three) hours. Patient not taking: Reported on 11/19/2018 04/16/18   Aline August, MD    Family History Family History  Problem Relation Age of Onset  . Diabetes Mellitus II Brother   . CAD Neg Hx   . Stroke Neg Hx     Social History  Social History   Tobacco Use  . Smoking status: Former Research scientist (life sciences)  . Smokeless tobacco: Never Used  . Tobacco comment: Smoked heavily until the day of his stroke  Substance Use Topics  . Alcohol use: Not Currently    Comment: Drank heavily prior to stroke per sister  . Drug use: Not Currently     Allergies   Patient has no known allergies.   Review of Systems Review of Systems  Unable to perform ROS: Patient nonverbal  Constitutional: Negative for chills and fever.  HENT: Negative for ear pain and sore throat.   Eyes: Negative for pain and visual disturbance.  Respiratory: Negative for cough and shortness of breath.   Cardiovascular: Negative for chest pain and palpitations.  Gastrointestinal: Positive for vomiting. Negative for abdominal pain.  Genitourinary: Negative for dysuria and hematuria.  Musculoskeletal: Negative for arthralgias and back pain.  Skin: Negative for color change and rash.  Neurological: Negative for seizures and syncope.  All other systems reviewed and are negative.    Physical Exam Updated Vital Signs BP 110/67 (BP Location: Right Arm)   Pulse 81   Temp 98.3 F (36.8 C) (Oral)   Resp 16   SpO2 94%   Physical Exam   ED Treatments / Results  Labs (all labs ordered are listed, but only abnormal results are displayed) Labs Reviewed - No data to display  EKG None  Radiology No results found.  Procedures Procedures (including critical care time)  Medications Ordered in ED Medications - No data to display   Initial Impression / Assessment and Plan / ED Course  I have reviewed the triage vital signs and the nursing notes.  Pertinent labs & imaging results that were available during my care of the patient were reviewed by me and considered in my medical decision making (see chart for details).  Clinical Course as of Nov 23 1928  Sun Nov 24, 2018  1735 Discussed with sister - has been vomiting dark brown material; went to [facility] a  few days ago   [RD]    Clinical Course User Index [RD] Lucrezia Starch, MD      68 year old gentleman who is G-tube dependent, trach dependent presents from home with concern for vomiting/spitting up dark brown/[place].  Caregiver reports this is been going on for weeks and was not addressed at prior ER visits.  Per chart review, recently seen in our emergency department for G-tube replacement  which was performed successfully and G-tube is functioning properly at this time.  CT scan at that time was negative.  Labs today were unremarkable.  No change in hemoglobin, no change in BUN to suggest GI bleed.  Chest x-ray without acute changes to suggest significant aspiration event.  Patient had no episodes of vomiting while in the department.  He remained well-appearing with stable vital signs.  At this time believe patient appropriate for outpatient management and follow-up with his primary doctor and outpatient GI.    After the discussed management above, the patient was determined to be safe for discharge.  The patient was in agreement with this plan and all questions regarding their care were answered.  ED return precautions were discussed and the patient will return to the ED with any significant worsening of condition.   Final Clinical Impressions(s) / ED Diagnoses   Final diagnoses:  Non-intractable vomiting, presence of nausea not specified, unspecified vomiting type    ED Discharge Orders    None       Lucrezia Starch, MD 11/24/18 [id number]

## 2018-11-24 NOTE — ED Notes (Signed)
Patient and pt's sister verbalized/expressed understanding of discharge instructions. Opportunity for questioning and answers were provided. Armband removed by staff, pt discharged from ED.  Pt transported by [facility].

## 2018-11-24 NOTE — Discharge Instructions (Addendum)
Please call your primary care doctor to schedule a follow-up appointment in their clinic.  Please discuss the gastroenterology referral that they had placed previously.  Please follow-up with that GI doctor as well.  If you are unable to get an appointment with them, you may also call the number provided in his discharge paperwork for follow-up with gastroenterology.  If he develops worsening vomiting, abdominal pain, difficulty breathing or other new concerning symptom recommend returning to the emergency department for reassessment.

## 2018-12-26 IMAGING — CR DG ABDOMEN 2V
4 series · 4 of 4 positions shown · non-contrast
Comparison: None.

CLINICAL DATA: Abdominal pain.  Vomiting.

EXAM:
ABDOMEN - 2 VIEW

[w chest lat]
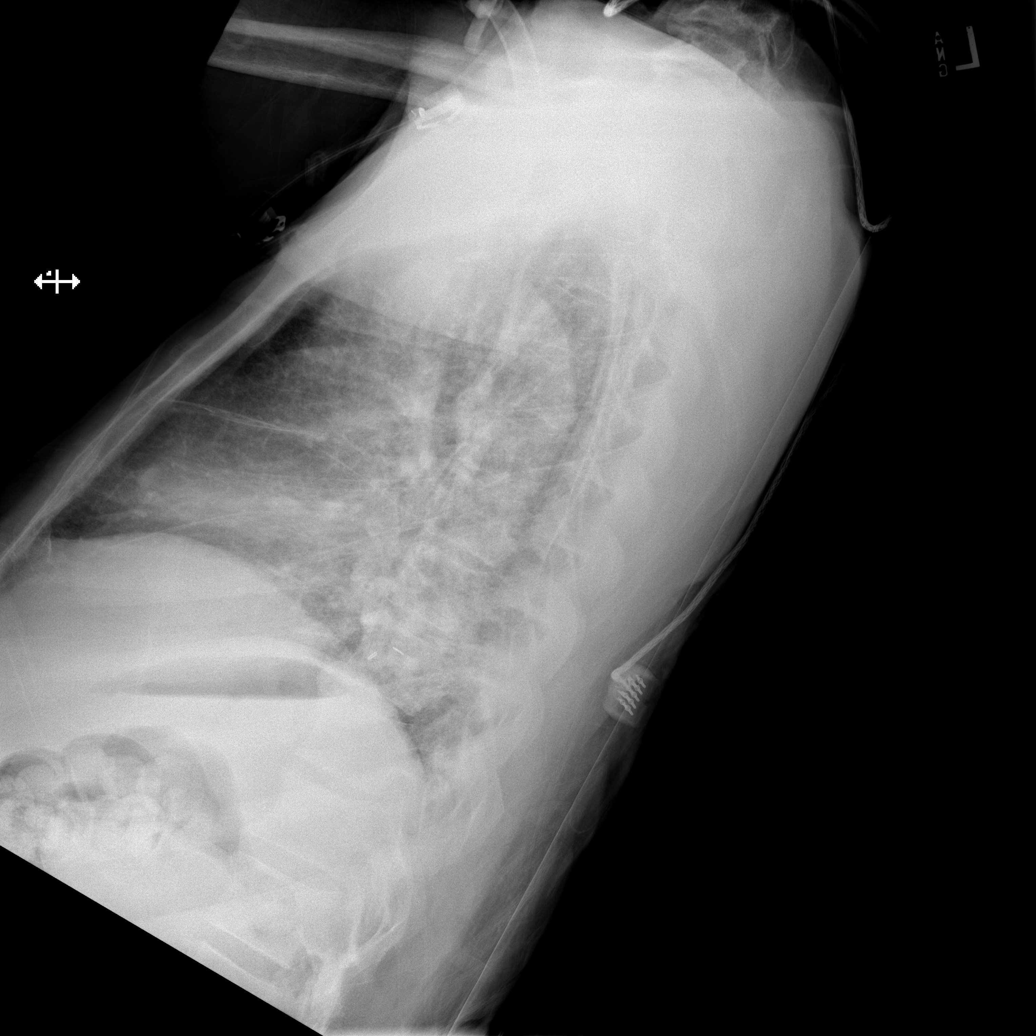

[x chest ap]
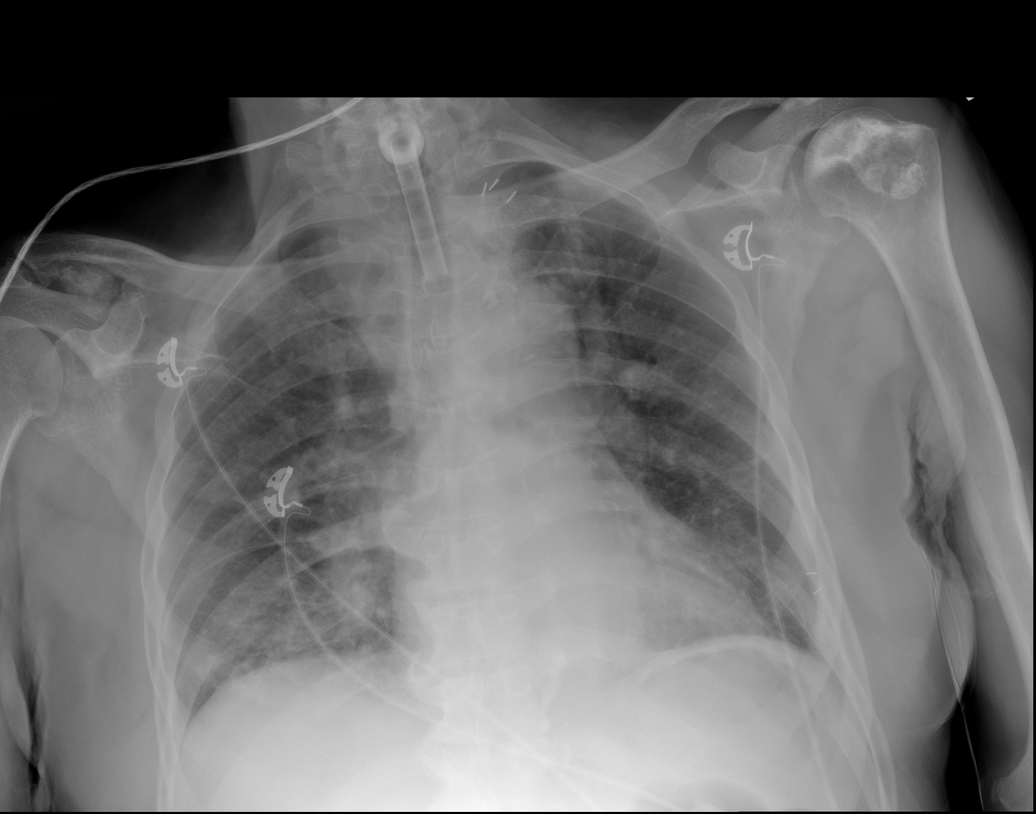

[x abdomen supine (1 of 2)]
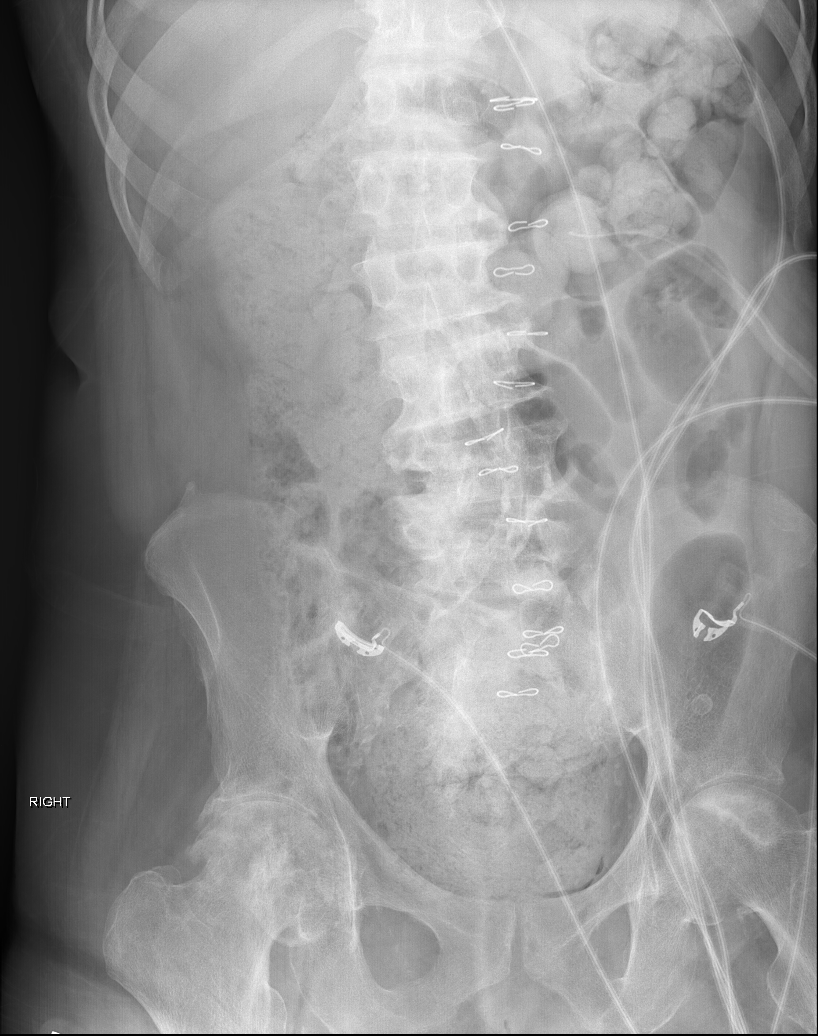

[x abdomen supine (2 of 2)]
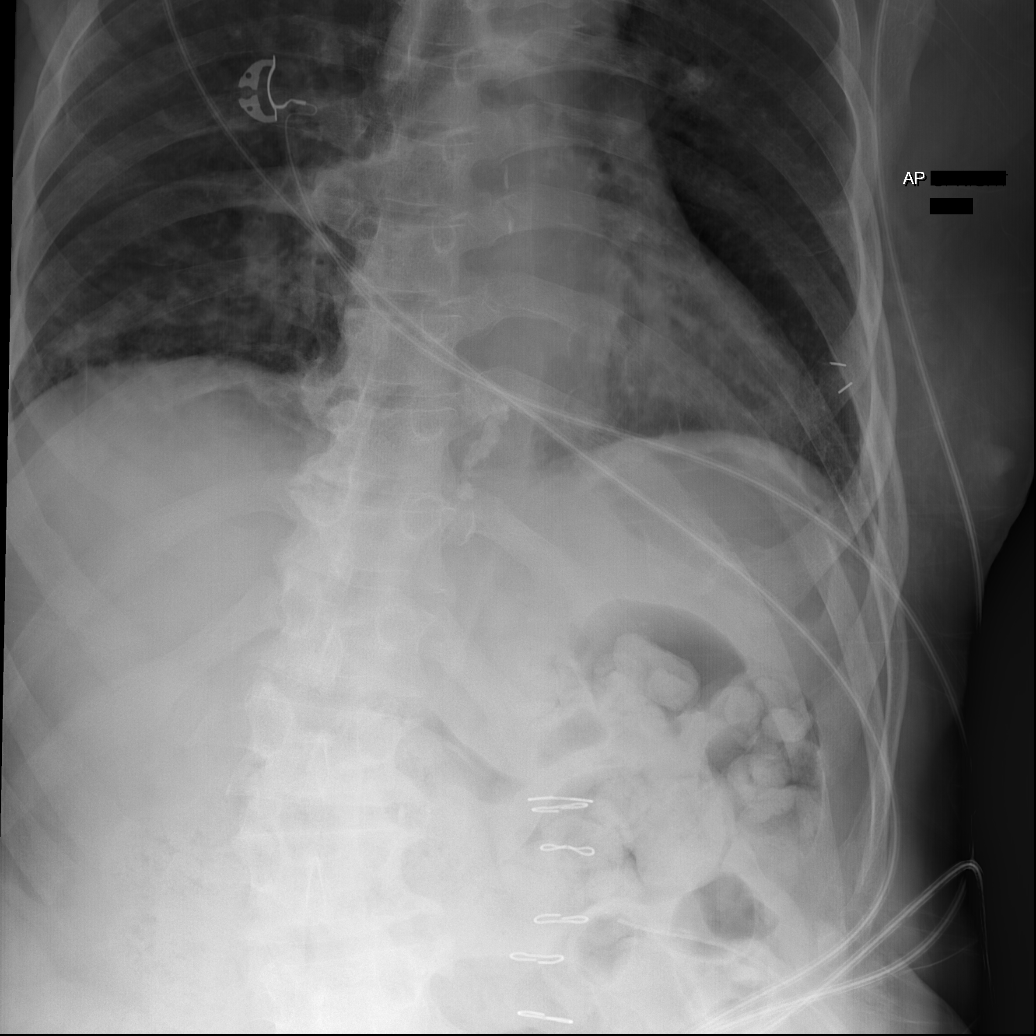

[4 of 4 positions shown; findings below may reference images not displayed]

FINDINGS: There is no visible bowel obstruction or free air. However there is
massive stool burden in the rectal vault, as well as the RIGHT colon
and transverse colon consistent with extensive constipation.
Surgical clips mid abdomen. No radiopaque calculi are observed.
Vascular calcification is noted. There is degenerative change both
hips.
IMPRESSION: Extensive constipation.  No acute findings.

## 2018-12-28 IMAGING — DX DG CHEST 1V PORT
1 series · 1 of 1 positions shown · non-contrast
Comparison: 09/30/2017

CLINICAL DATA: Follow-up tracheostomy tube

EXAM:
PORTABLE CHEST 1 VIEW

[chest ap]
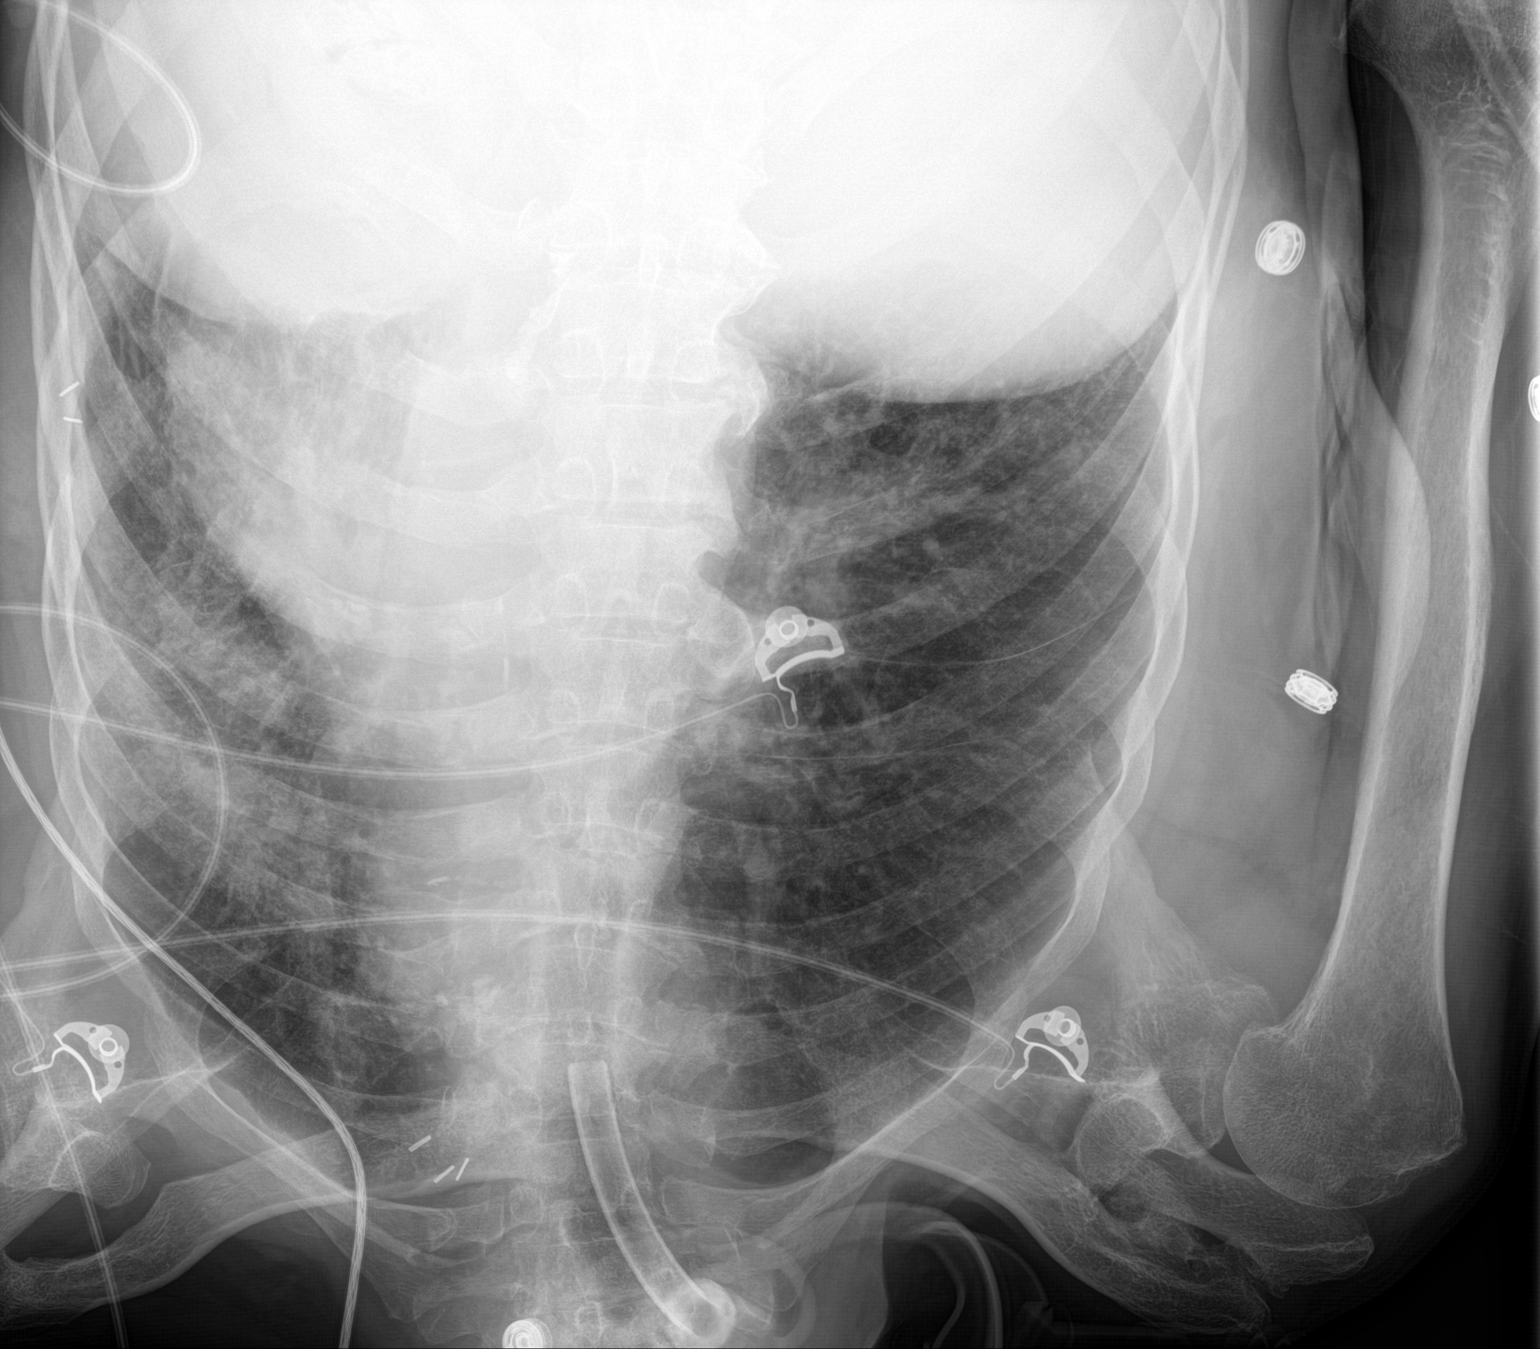

[1 of 1 positions shown; findings below may reference images not displayed]

FINDINGS: Cardiac shadow is stable. Tracheostomy tube is again noted. The
right lung is clear. Patchy infiltrate is noted in the left mid
lung. No bony abnormality is seen.
IMPRESSION: New patchy left lung infiltrate.

## 2018-12-28 IMAGING — DX DG ABD PORTABLE 1V
1 series · 1 of 1 positions shown · non-contrast
Comparison: 11/16/2017

CLINICAL DATA: Constipation

EXAM:
PORTABLE ABDOMEN - 1 VIEW

[abdomen kub]
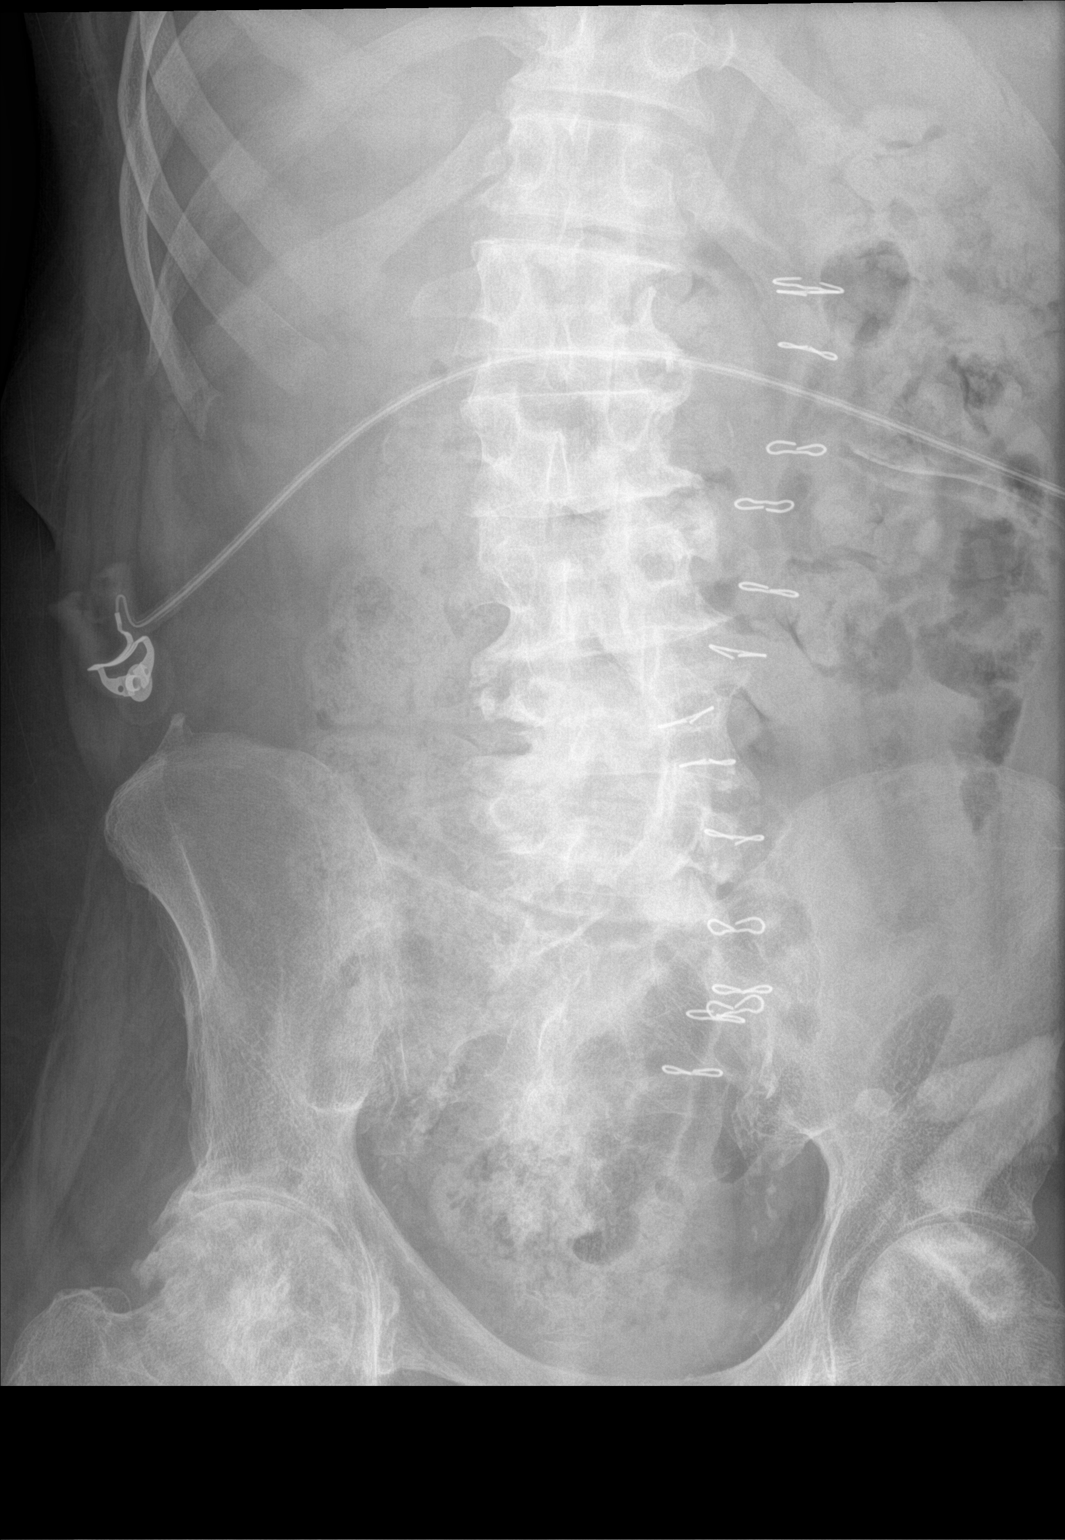

[1 of 1 positions shown; findings below may reference images not displayed]

FINDINGS: Scattered large and small bowel gas is noted. Constipation is again
identified and stable. No free air is seen. Degenerative changes of
the lumbar spine and hip joints are noted.
IMPRESSION: Changes of constipation stable from the prior study.

## 2018-12-30 IMAGING — DX DG ABD PORTABLE 1V
1 series · 1 of 1 positions shown · non-contrast
Comparison: Abdominal radiograph of November 18, 2017

CLINICAL DATA: Constipation.

EXAM:
PORTABLE ABDOMEN - 1 VIEW

[abdomen kub]
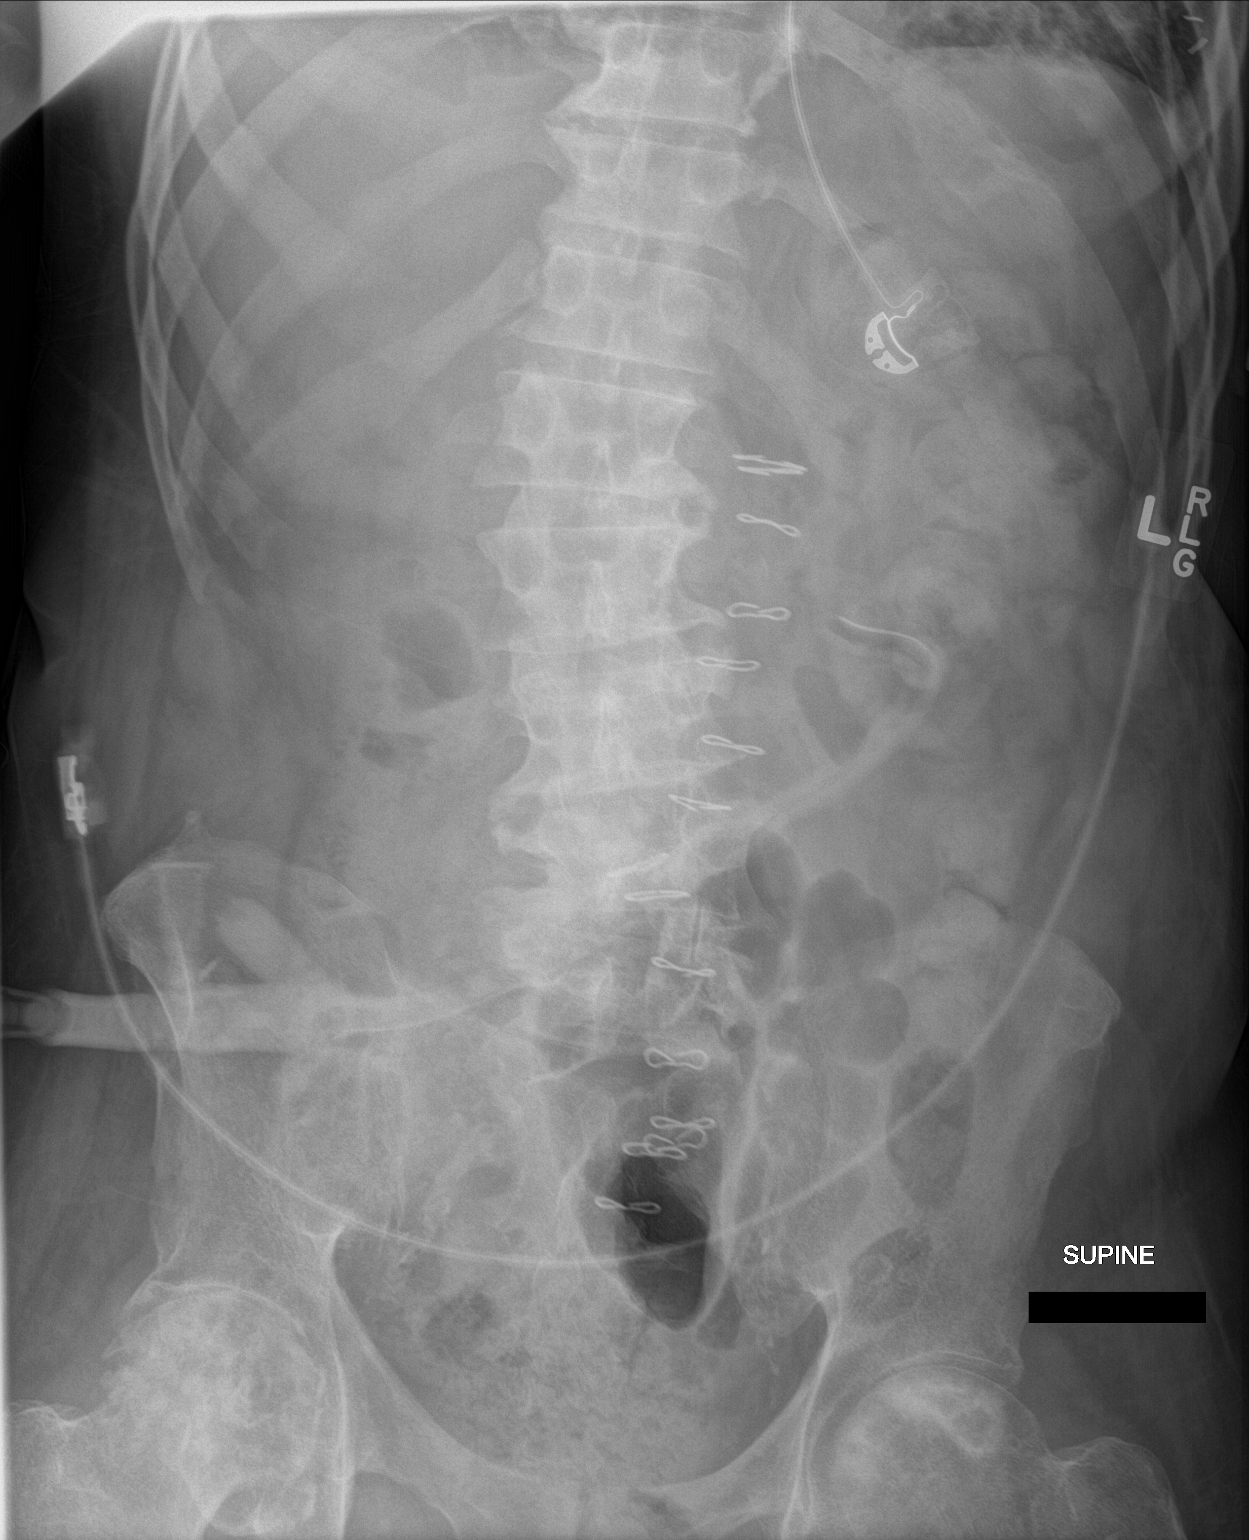

[1 of 1 positions shown; findings below may reference images not displayed]

FINDINGS: The colonic stool burden remains increased. There is slightly
increased rectal stool burden. A gastrostomy tube is present. There
are surgical skin staples just to the left of midline. There are
degenerative changes of the lumbar spine. There are degenerative
changes and likely changes of avascular necrosis in the femoral
heads.
IMPRESSION: Persistently increased colonic stool burden possibly slightly
decreased since the previous study. Mild increase in rectal stool
burden may reflect a fecal impaction.

## 2018-12-31 ENCOUNTER — Other Ambulatory Visit: Payer: Self-pay

## 2018-12-31 ENCOUNTER — Inpatient Hospital Stay (HOSPITAL_COMMUNITY)
Admission: EM | Admit: 2018-12-31 | Discharge: 2019-01-02 | DRG: 377 | Disposition: A | Discharge status: Home / Self Care | Payer: Medicare HMO | Attending: Family Medicine | Admitting: Family Medicine

## 2018-12-31 ENCOUNTER — Emergency Department (HOSPITAL_COMMUNITY): Payer: Medicare HMO

## 2018-12-31 ENCOUNTER — Encounter (HOSPITAL_COMMUNITY): Payer: Self-pay

## 2018-12-31 DIAGNOSIS — R64 Cachexia: Secondary | ICD-10-CM | POA: Diagnosis present

## 2018-12-31 DIAGNOSIS — J9610 Chronic respiratory failure, unspecified whether with hypoxia or hypercapnia: Secondary | ICD-10-CM | POA: Diagnosis present

## 2018-12-31 DIAGNOSIS — Z7902 Long term (current) use of antithrombotics/antiplatelets: Secondary | ICD-10-CM

## 2018-12-31 DIAGNOSIS — I69391 Dysphagia following cerebral infarction: Secondary | ICD-10-CM

## 2018-12-31 DIAGNOSIS — M879 Osteonecrosis, unspecified: Secondary | ICD-10-CM | POA: Diagnosis present

## 2018-12-31 DIAGNOSIS — Z7982 Long term (current) use of aspirin: Secondary | ICD-10-CM

## 2018-12-31 DIAGNOSIS — K5641 Fecal impaction: Secondary | ICD-10-CM | POA: Diagnosis present

## 2018-12-31 DIAGNOSIS — Z23 Encounter for immunization: Secondary | ICD-10-CM | POA: Diagnosis present

## 2018-12-31 DIAGNOSIS — K219 Gastro-esophageal reflux disease without esophagitis: Secondary | ICD-10-CM | POA: Diagnosis present

## 2018-12-31 DIAGNOSIS — J690 Pneumonitis due to inhalation of food and vomit: Secondary | ICD-10-CM

## 2018-12-31 DIAGNOSIS — G629 Polyneuropathy, unspecified: Secondary | ICD-10-CM | POA: Diagnosis present

## 2018-12-31 DIAGNOSIS — R799 Abnormal finding of blood chemistry, unspecified: Secondary | ICD-10-CM | POA: Diagnosis not present

## 2018-12-31 DIAGNOSIS — Z20828 Contact with and (suspected) exposure to other viral communicable diseases: Secondary | ICD-10-CM | POA: Diagnosis present

## 2018-12-31 DIAGNOSIS — Z931 Gastrostomy status: Secondary | ICD-10-CM | POA: Diagnosis not present

## 2018-12-31 DIAGNOSIS — N183 Chronic kidney disease, stage 3 (moderate): Secondary | ICD-10-CM | POA: Diagnosis present

## 2018-12-31 DIAGNOSIS — Z86010 Personal history of colonic polyps: Secondary | ICD-10-CM

## 2018-12-31 DIAGNOSIS — K922 Gastrointestinal hemorrhage, unspecified: Secondary | ICD-10-CM | POA: Diagnosis present

## 2018-12-31 DIAGNOSIS — K920 Hematemesis: Secondary | ICD-10-CM | POA: Diagnosis not present

## 2018-12-31 DIAGNOSIS — I69320 Aphasia following cerebral infarction: Secondary | ICD-10-CM | POA: Diagnosis not present

## 2018-12-31 DIAGNOSIS — E872 Acidosis: Secondary | ICD-10-CM | POA: Diagnosis present

## 2018-12-31 DIAGNOSIS — Z833 Family history of diabetes mellitus: Secondary | ICD-10-CM

## 2018-12-31 DIAGNOSIS — Z930 Tracheostomy status: Secondary | ICD-10-CM | POA: Diagnosis not present

## 2018-12-31 DIAGNOSIS — D631 Anemia in chronic kidney disease: Secondary | ICD-10-CM | POA: Diagnosis present

## 2018-12-31 DIAGNOSIS — R0602 Shortness of breath: Secondary | ICD-10-CM

## 2018-12-31 DIAGNOSIS — R1110 Vomiting, unspecified: Secondary | ICD-10-CM

## 2018-12-31 DIAGNOSIS — I129 Hypertensive chronic kidney disease with stage 1 through stage 4 chronic kidney disease, or unspecified chronic kidney disease: Secondary | ICD-10-CM | POA: Diagnosis present

## 2018-12-31 DIAGNOSIS — K269 Duodenal ulcer, unspecified as acute or chronic, without hemorrhage or perforation: Secondary | ICD-10-CM | POA: Diagnosis not present

## 2018-12-31 DIAGNOSIS — Z79899 Other long term (current) drug therapy: Secondary | ICD-10-CM

## 2018-12-31 DIAGNOSIS — Z87891 Personal history of nicotine dependence: Secondary | ICD-10-CM

## 2018-12-31 DIAGNOSIS — Z993 Dependence on wheelchair: Secondary | ICD-10-CM

## 2018-12-31 DIAGNOSIS — K449 Diaphragmatic hernia without obstruction or gangrene: Secondary | ICD-10-CM | POA: Diagnosis present

## 2018-12-31 DIAGNOSIS — Z7401 Bed confinement status: Secondary | ICD-10-CM

## 2018-12-31 DIAGNOSIS — J189 Pneumonia, unspecified organism: Secondary | ICD-10-CM

## 2018-12-31 DIAGNOSIS — K264 Chronic or unspecified duodenal ulcer with hemorrhage: Principal | ICD-10-CM | POA: Diagnosis present

## 2018-12-31 DIAGNOSIS — E782 Mixed hyperlipidemia: Secondary | ICD-10-CM | POA: Diagnosis present

## 2018-12-31 DIAGNOSIS — K209 Esophagitis, unspecified: Secondary | ICD-10-CM | POA: Diagnosis present

## 2018-12-31 DIAGNOSIS — Z6827 Body mass index (BMI) 27.0-27.9, adult: Secondary | ICD-10-CM

## 2018-12-31 LAB — CBC WITH DIFFERENTIAL/PLATELET
Abs Immature Granulocytes: 0.07 10*3/uL (ref 0.00–0.07)
Abs Immature Granulocytes: 0.09 10*3/uL — ABNORMAL HIGH (ref 0.00–0.07)
Basophils Absolute: 0.1 10*3/uL (ref 0.0–0.1)
Basophils Absolute: 0 10*3/uL (ref 0.0–0.1)
Basophils Relative: 0 %
Basophils Relative: 0 %
Eosinophils Absolute: 0.2 10*3/uL (ref 0.0–0.5)
Eosinophils Absolute: 0.1 10*3/uL (ref 0.0–0.5)
Eosinophils Relative: 2 %
Eosinophils Relative: 1 %
HCT: 44.4 % (ref 39.0–52.0)
HCT: 38.6 % — ABNORMAL LOW (ref 39.0–52.0)
Hemoglobin: 13.4 g/dL (ref 13.0–17.0)
Hemoglobin: 11.4 g/dL — ABNORMAL LOW (ref 13.0–17.0)
Immature Granulocytes: 1 %
Immature Granulocytes: 1 %
Lymphocytes Relative: 12 %
Lymphocytes Relative: 12 %
Lymphs Abs: 1.7 10*3/uL (ref 0.7–4.0)
Lymphs Abs: 1.9 10*3/uL (ref 0.7–4.0)
MCH: 29.6 pg (ref 26.0–34.0)
MCH: 29.9 pg (ref 26.0–34.0)
MCHC: 30.2 g/dL (ref 30.0–36.0)
MCHC: 29.5 g/dL — ABNORMAL LOW (ref 30.0–36.0)
MCV: 98 fL (ref 80.0–100.0)
MCV: 101.3 fL — ABNORMAL HIGH (ref 80.0–100.0)
Monocytes Absolute: 1.6 10*3/uL — ABNORMAL HIGH (ref 0.1–1.0)
Monocytes Absolute: 2 10*3/uL — ABNORMAL HIGH (ref 0.1–1.0)
Monocytes Relative: 12 %
Monocytes Relative: 12 %
Neutro Abs: 10.1 10*3/uL — ABNORMAL HIGH (ref 1.7–7.7)
Neutro Abs: 12.6 10*3/uL — ABNORMAL HIGH (ref 1.7–7.7)
Neutrophils Relative %: 73 %
Neutrophils Relative %: 74 %
Platelets: 366 10*3/uL (ref 150–400)
Platelets: 306 10*3/uL (ref 150–400)
RBC: 4.53 MIL/uL (ref 4.22–5.81)
RBC: 3.81 MIL/uL — ABNORMAL LOW (ref 4.22–5.81)
RDW: 13.9 % (ref 11.5–15.5)
RDW: 14.1 % (ref 11.5–15.5)
WBC: 13.8 10*3/uL — ABNORMAL HIGH (ref 4.0–10.5)
WBC: 16.7 10*3/uL — ABNORMAL HIGH (ref 4.0–10.5)
nRBC: 0 % (ref 0.0–0.2)
nRBC: 0 % (ref 0.0–0.2)

## 2018-12-31 LAB — URINALYSIS, ROUTINE W REFLEX MICROSCOPIC
Bilirubin Urine: NEGATIVE
Glucose, UA: NEGATIVE mg/dL
Ketones, ur: NEGATIVE mg/dL
Nitrite: NEGATIVE
Protein, ur: 30 mg/dL — AB
Specific Gravity, Urine: 1.012 (ref 1.005–1.030)
WBC, UA: >50 WBC/hpf — ABNORMAL HIGH (ref 0–5)
pH: 6 (ref 5.0–8.0)

## 2018-12-31 LAB — COMPREHENSIVE METABOLIC PANEL
ALT: 14 U/L (ref 0–44)
AST: 18 U/L (ref 15–41)
Albumin: 2.6 g/dL — ABNORMAL LOW (ref 3.5–5.0)
Alkaline Phosphatase: 70 U/L (ref 38–126)
Anion gap: 11 (ref 5–15)
BUN: 54 mg/dL — ABNORMAL HIGH (ref 8–23)
CO2: 30 mmol/L (ref 22–32)
Calcium: 8.2 mg/dL — ABNORMAL LOW (ref 8.9–10.3)
Chloride: 102 mmol/L (ref 98–111)
Creatinine, Ser: 1 mg/dL (ref 0.61–1.24)
GFR calc Af Amer: >60 mL/min (ref >60)
GFR calc non Af Amer: >60 mL/min (ref >60)
Glucose, Bld: 91 mg/dL (ref 70–99)
Potassium: 4.6 mmol/L (ref 3.5–5.1)
Sodium: 143 mmol/L (ref 135–145)
Total Bilirubin: 0.3 mg/dL (ref 0.3–1.2)
Total Protein: 7.1 g/dL (ref 6.5–8.1)

## 2018-12-31 LAB — ABO/RH: ABO/RH(D): O POS

## 2018-12-31 LAB — PROCALCITONIN: Procalcitonin: 0.42 ng/mL

## 2018-12-31 LAB — LACTIC ACID, PLASMA: Lactic Acid, Venous: 2.8 mmol/L (ref 0.5–1.9)

## 2018-12-31 LAB — EXPECTORATED SPUTUM ASSESSMENT W GRAM STAIN, RFLX TO RESP C

## 2018-12-31 LAB — SARS CORONAVIRUS 2 (TAT 6-24 HRS): SARS Coronavirus 2: NEGATIVE

## 2018-12-31 LAB — TYPE AND SCREEN
ABO/RH(D): O POS
Antibody Screen: NEGATIVE

## 2018-12-31 LAB — MRSA PCR SCREENING: MRSA by PCR: NEGATIVE

## 2018-12-31 LAB — LIPASE, BLOOD: Lipase: 24 U/L (ref 11–51)

## 2018-12-31 LAB — PH, GASTRIC FLUID (GASTROCCULT CARD): pH, Gastric: 4

## 2018-12-31 IMAGING — CT CT PELVIS W/ CM
2 of 3 series · 16 of 46 positions shown, 18 images · IV contrast (APPLIED)
Comparison: 06/20/2017

CLINICAL DATA: 67-year-old male with a history of possible anal
fissure or fistula

EXAM:
CT PELVIS WITH CONTRAST
TECHNIQUE: Multidetector CT imaging of the pelvis was performed using the
standard protocol following the bolus administration of intravenous
contrast.
CONTRAST:  100mL OMNIPAQUE IOHEXOL 300 MG/ML SOLN, 30mL [id number]
IOPAMIDOL ([id number]) INJECTION 61%

[Series 2: axial st · axial · 0.89mm/px · z∈[-588,-353]mm · 13 of 55 slices shown, 15 images]
[im 4/55  soft-tissue]
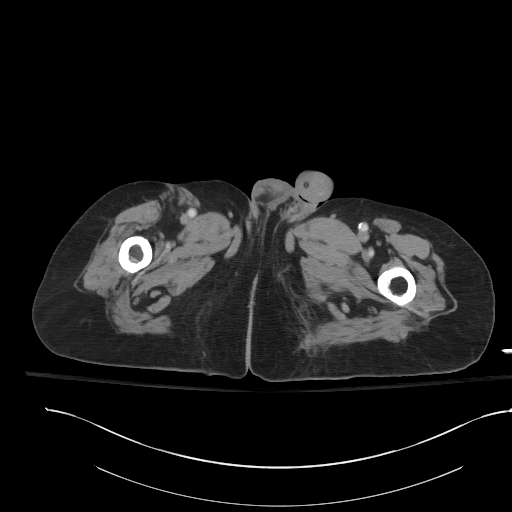
[im 4/55  bone]
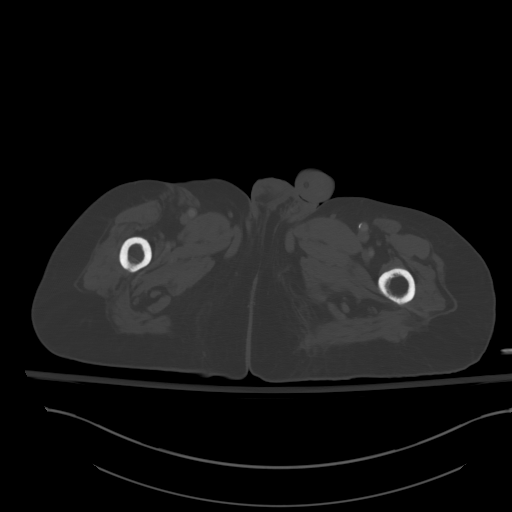
[im 7/55  soft-tissue]
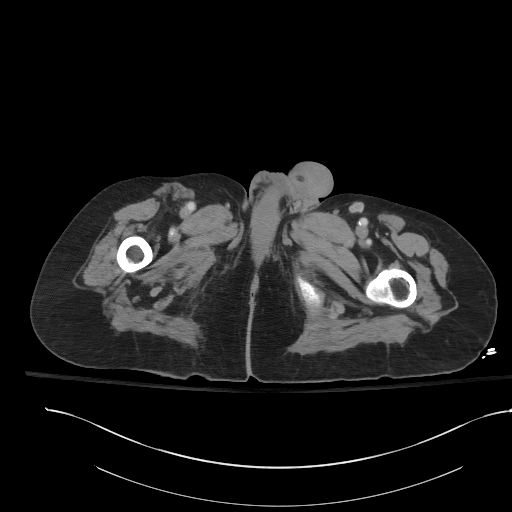
[im 11/55  soft-tissue]
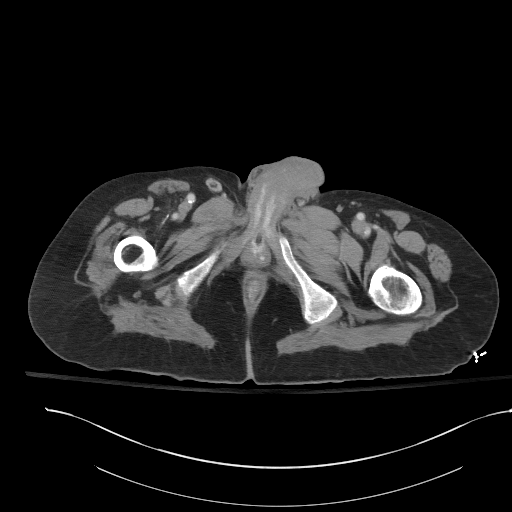
[im 16/55  soft-tissue]
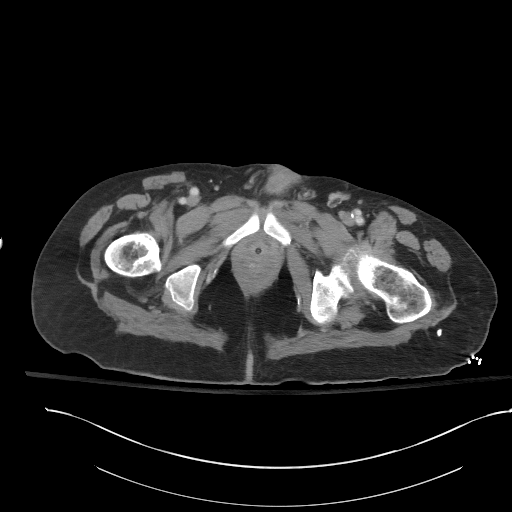
[im 20/55  soft-tissue]
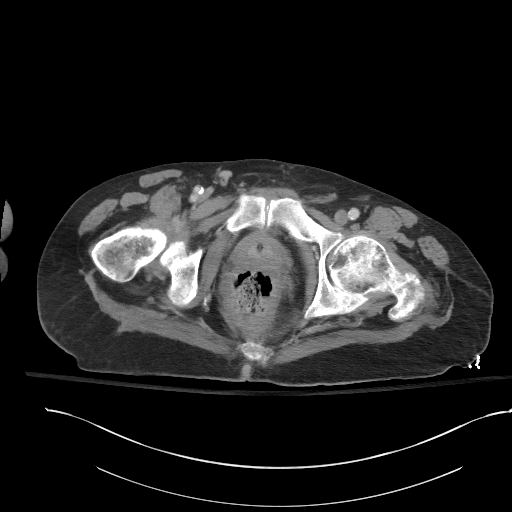
[im 23/55  soft-tissue]
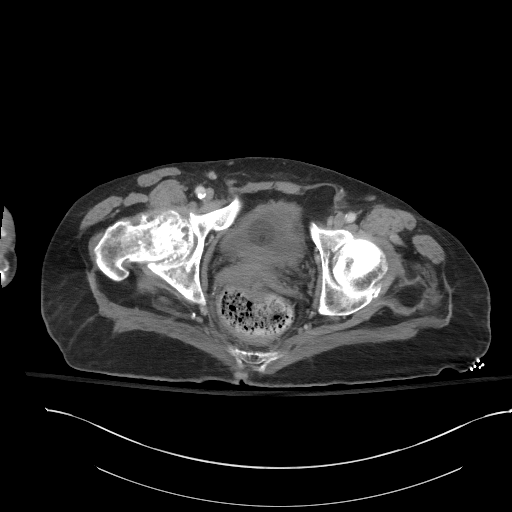
[im 28/55  soft-tissue]
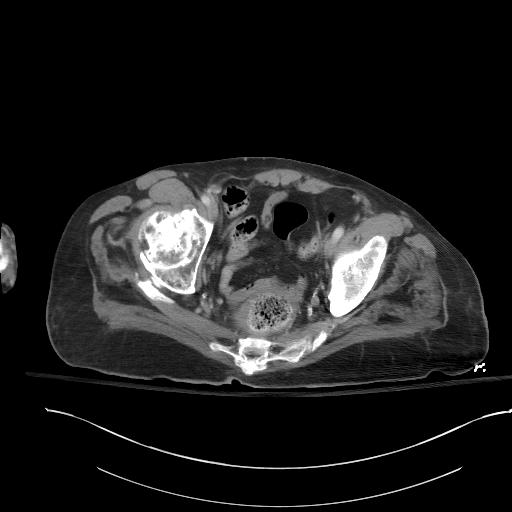
[im 32/55  soft-tissue]
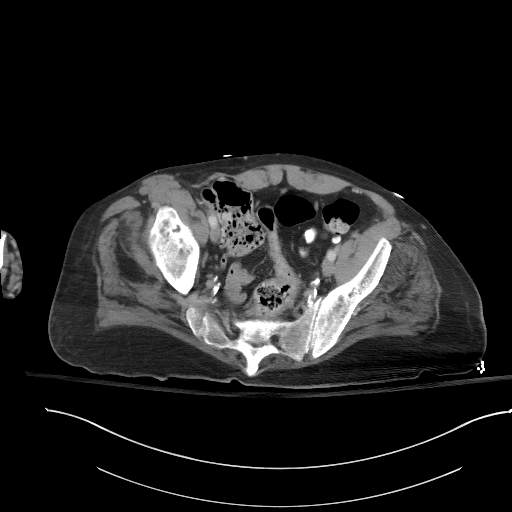
[im 35/55  soft-tissue]
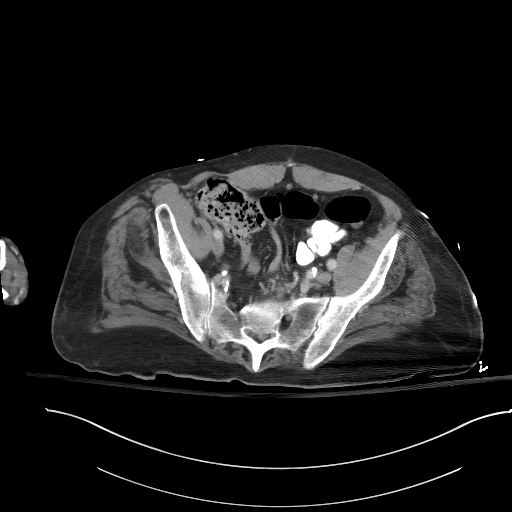
[im 35/55  bone]
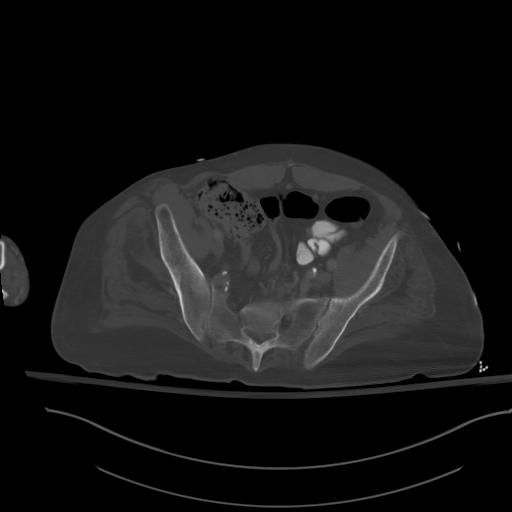
[im 39/55  soft-tissue]
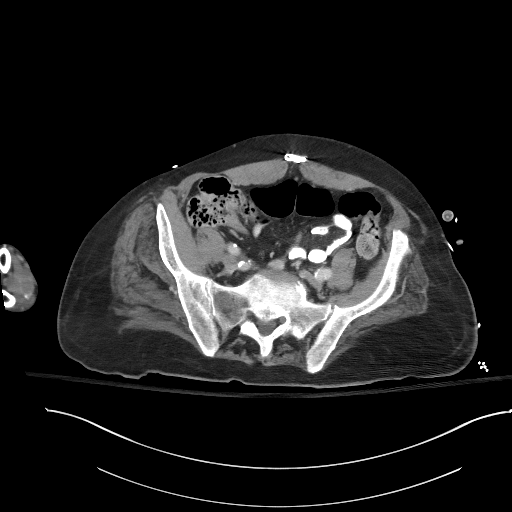
[im 44/55  soft-tissue]
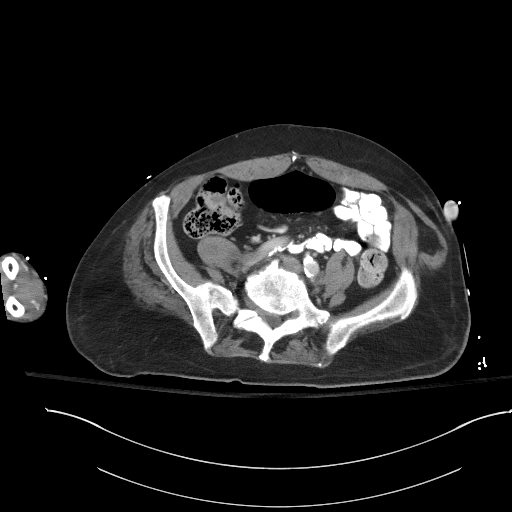
[im 48/55  soft-tissue]
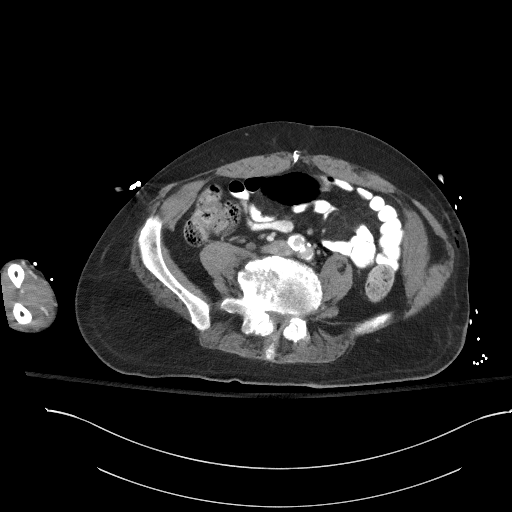
[im 51/55  soft-tissue]
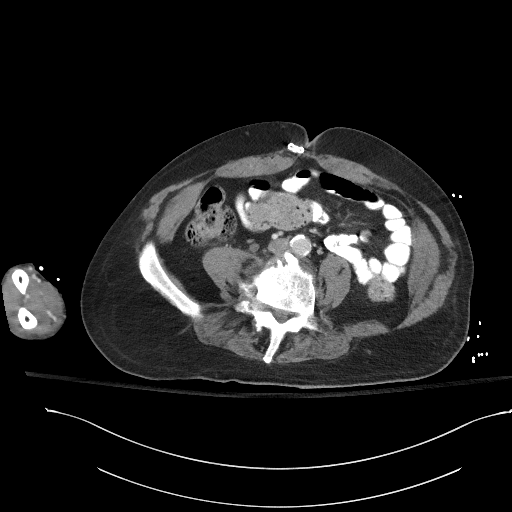

[Series 5: coronal st · coronal · 0.64mm/px · 3 of 85 slices shown]
[im 29/85  soft-tissue]
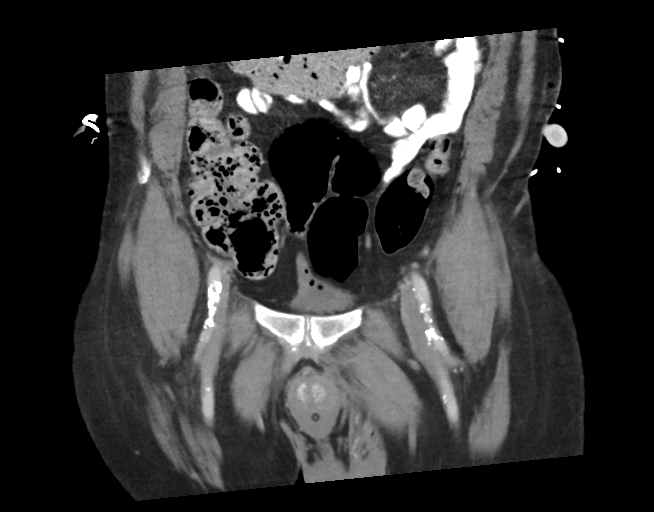
[im 38/85  soft-tissue]
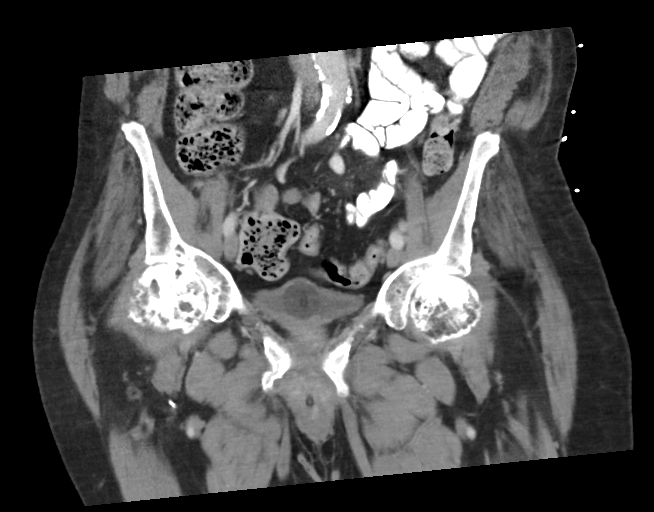
[im 47/85  soft-tissue]
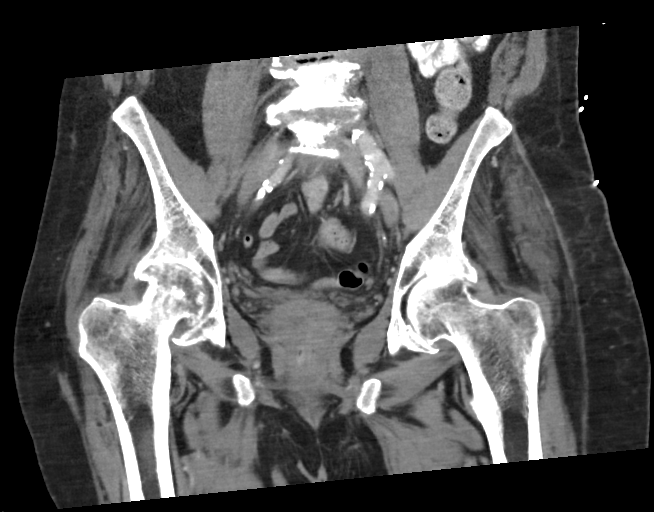

[16 of 46 positions shown; findings below may reference images not displayed]

FINDINGS: Urinary Tract: Balloon retention urinary catheter within urinary
bladder which is decompressed.

Bowel: Compared to the prior CT scan of 06/20/2017 there has been
significant decreased volume of formed stool within the rectum. No
distention of the visualized colon or small bowel. Colonic
diverticula are present without associated inflammation.

The bilateral ischial rectal fossa are unremarkable with no focal
inflammation or fluid. Unremarkable appearance of the perirectal fat
and presacral fat. No evidence of perforation. Normal appendix is
visualized.

Vascular/Lymphatic: Atherosclerotic calcifications of the distal
aorta and bilateral iliac arteries. Calcifications of the bilateral
common femoral arteries. Common femoral arteries remain patent.

Reproductive:  Unremarkable appearance of the prostate.

Other: Surgical changes of the midline abdomen. Bilateral fat
containing inguinal hernia.

Musculoskeletal: Osteopenia. No displaced fracture. AVN of the left
femoral head. Advanced degenerative changes of the right femoral
head
IMPRESSION: No acute CT finding.

Significant reduction in formed stool of the visualized colon, with
no evidence of bowel perforation, obstruction, perirectal abscess.

Aortic Atherosclerosis ([id number]-X1I.I).

## 2018-12-31 MED ORDER — PIPERACILLIN-TAZOBACTAM 3.375 G IVPB
3.3750 g | Freq: Three times a day (TID) | INTRAVENOUS | Status: DC
  Administered 2018-12-31 – 2019-01-02 (×5): 3.375 g via INTRAVENOUS
  Filled 2018-12-31 (×5): qty 50

## 2018-12-31 MED ORDER — CHLORHEXIDINE GLUCONATE 0.12 % MT SOLN
15.0000 mL | Freq: Two times a day (BID) | OROMUCOSAL | Status: DC
  Administered 2019-01-01 – 2019-01-02 (×2): 15 mL via OROMUCOSAL
  Filled 2018-12-31 (×3): qty 15

## 2018-12-31 MED ORDER — CHLORHEXIDINE GLUCONATE 0.12 % MT SOLN
15.0000 mL | Freq: Two times a day (BID) | OROMUCOSAL | Status: DC
  Administered 2018-12-31 – 2019-01-01 (×2): 15 mL via OROMUCOSAL

## 2018-12-31 MED ORDER — SODIUM CHLORIDE 0.9 % IV BOLUS (SEPSIS)
1000.0000 mL | Freq: Once | INTRAVENOUS | Status: AC
  Administered 2018-12-31: 1000 mL via INTRAVENOUS

## 2018-12-31 MED ORDER — CLINDAMYCIN PHOSPHATE 600 MG/50ML IV SOLN
600.0000 mg | Freq: Once | INTRAVENOUS | Status: AC
  Administered 2018-12-31: 600 mg via INTRAVENOUS
  Filled 2018-12-31: qty 50

## 2018-12-31 MED ORDER — PANTOPRAZOLE SODIUM 40 MG IV SOLR: 40.0000 mg | Freq: Two times a day (BID) | INTRAVENOUS | Status: DC

## 2018-12-31 MED ORDER — IOHEXOL 300 MG/ML  SOLN
30.0000 mL | Freq: Once | INTRAMUSCULAR | Status: AC | PRN
  Administered 2018-12-31: 30 mL

## 2018-12-31 MED ORDER — CHLORHEXIDINE GLUCONATE CLOTH 2 % EX PADS
6.0000 | MEDICATED_PAD | Freq: Every day | CUTANEOUS | Status: DC
  Administered 2018-12-31: 6 via TOPICAL

## 2018-12-31 MED ORDER — CLONIDINE HCL 0.1 MG/24HR TD PTWK
0.1000 mg | MEDICATED_PATCH | TRANSDERMAL | Status: DC
  Administered 2018-12-31: 0.1 mg via TRANSDERMAL
  Filled 2018-12-31: qty 1

## 2018-12-31 MED ORDER — SODIUM CHLORIDE 0.9 % IV SOLN
500.0000 mg | Freq: Once | INTRAVENOUS | Status: AC
  Administered 2018-12-31: 500 mg via INTRAVENOUS
  Filled 2018-12-31: qty 500

## 2018-12-31 MED ORDER — ORAL CARE MOUTH RINSE: 15.0000 mL | Freq: Two times a day (BID) | OROMUCOSAL | Status: DC

## 2018-12-31 MED ORDER — SODIUM CHLORIDE 0.9 % IV BOLUS (SEPSIS)
250.0000 mL | Freq: Once | INTRAVENOUS | Status: AC
  Administered 2018-12-31: 250 mL via INTRAVENOUS

## 2018-12-31 MED ORDER — SODIUM CHLORIDE 0.9 % IV SOLN
8.0000 mg/h | INTRAVENOUS | Status: DC
  Filled 2018-12-31: qty 80

## 2018-12-31 MED ORDER — SODIUM CHLORIDE 0.9 % IV SOLN
80.0000 mg | Freq: Once | INTRAVENOUS | Status: DC
  Administered 2018-12-31: 80 mg via INTRAVENOUS
  Filled 2018-12-31: qty 80

## 2018-12-31 MED ORDER — ORAL CARE MOUTH RINSE
15.0000 mL | Freq: Two times a day (BID) | OROMUCOSAL | Status: DC
  Administered 2018-12-31 – 2019-01-01 (×4): 15 mL via OROMUCOSAL

## 2018-12-31 MED ORDER — DEXTROSE-NACL 5-0.45 % IV SOLN
INTRAVENOUS | Status: DC
  Administered 2018-12-31 – 2019-01-02 (×4): via INTRAVENOUS

## 2018-12-31 MED ORDER — PANTOPRAZOLE SODIUM 40 MG IV SOLR
40.0000 mg | Freq: Two times a day (BID) | INTRAVENOUS | Status: DC
  Administered 2018-12-31 – 2019-01-02 (×4): 40 mg via INTRAVENOUS
  Filled 2018-12-31 (×4): qty 40

## 2018-12-31 MED ORDER — FREE WATER
100.0000 mL | Status: DC
  Administered 2018-12-31 – 2019-01-02 (×10): 100 mL

## 2018-12-31 MED ORDER — INFLUENZA VAC A&B SA ADJ QUAD 0.5 ML IM PRSY
0.5000 mL | PREFILLED_SYRINGE | INTRAMUSCULAR | Status: AC
  Administered 2019-01-01: 0.5 mL via INTRAMUSCULAR
  Filled 2018-12-31 (×2): qty 0.5

## 2018-12-31 MED ORDER — SODIUM CHLORIDE 0.9 % IV SOLN
1.0000 g | Freq: Once | INTRAVENOUS | Status: AC
  Administered 2018-12-31: 1 g via INTRAVENOUS
  Filled 2018-12-31: qty 10

## 2018-12-31 MED ORDER — SCOPOLAMINE 1 MG/3DAYS TD PT72
1.5000 mg | MEDICATED_PATCH | TRANSDERMAL | Status: DC
  Administered 2018-12-31: 17:00:00 1.5 mg via TRANSDERMAL
  Filled 2018-12-31: qty 1

## 2018-12-31 MED ORDER — SODIUM CHLORIDE 0.9 % IV BOLUS (SEPSIS)
1000.0000 mL | Freq: Once | INTRAVENOUS | Status: AC
  Administered 2018-12-31 (×2): 1000 mL via INTRAVENOUS

## 2018-12-31 MED ORDER — POLYETHYLENE GLYCOL 3350 17 G PO PACK
17.0000 g | PACK | ORAL | Status: AC
  Administered 2018-12-31 (×3): 17 g
  Filled 2018-12-31 (×3): qty 1

## 2018-12-31 NOTE — Progress Notes (Signed)
Pt. arrived via EMS with PRB mask over Trach site, pt. had small amount of charcoal colored/coffee ground appearance mucus at site, pt. suctioned by RT for moderate of the same, 87% upon arrival after mask changed out to 8lpm/35% ATC with oxygen saturations >'d to 95% once placed, inner cannula removed/cleaned with drain sponge and new trach ties placed, sx. equipment placed at bedside/AMBU bag available in room.

## 2018-12-31 NOTE — Consult Note (Signed)
NAME:  Scott Rivera, MRN:  [id number], DOB:  [birth year], LOS: 0 ADMISSION DATE:  12/31/2018, CONSULTATION DATE: 9/15 REFERRING MD:  Verlon Au, CHIEF COMPLAINT:  Bloody trach secretions    Brief History   68 year old trach/PEG dependent male admitted w/ working dx of aspiration PNA but had bloody secretions from and around the trach leading to pulm consult   History of present illness   This is a 68 year old male w/ chronic trach/PEG dependence s/p CVA. Presented to ED w/ cc: cough and dark charcoal colored tracheal secretions. Had mild leukocytosis and RLL airspace disease on CXR so being admitted by IM service. There was concern about bloody secretions from and around the trach so Pulm consulted.   -[facility] ED reports w/ visit on 8/10 pt seen for vomiting coffee gd emesis which has been negative for blood in past.   Past Medical History  HTN, prior acute CVA, HL, dysphagia w/ PEG, trach dependent s/p stroke, stage II CKD  Significant Hospital Events   9/15 admitted   Consults:  pulm   Procedures:    Significant Diagnostic Tests:  Gastric occult blood 9/15>>>  Micro Data:  Respiratory culture 9/15>>> COVID 19 9/15>>> Antimicrobials:    Interim history/subjective:    Objective   Blood pressure (Abnormal) 143/81, pulse 87, temperature 99.7 F (37.6 C), temperature source Rectal, resp. rate 18, SpO2 96 %.    FiO2 (%):  [35 %] 35 %   Intake/Output Summary (Last 24 hours) at 12/31/2018 1346 Last data filed at 12/31/2018 0945 Gross per 24 hour  Intake 400 ml  Output no documentation  Net 400 ml   There were no vitals filed for this visit.  Examination: General: chronically ill appearing black male, not in acute distress HENT: poor dentition. Cuffed trach w/ copious coffee gd/charcoal appearing tracheal secretions  Lungs: crackles over RLL other wise clear currently w/out accessory use  Cardiovascular: RRR w/out MRG  Abdomen: soft Not tender to palp  Extremities: trace  LE edema / poor muscle tone  Neuro: awake, nods appropriately. Will f/c  GU: chronic foley w/ cloudy urine   Resolved Hospital Problem list    Assessment & Plan:   Chronic trach dependence  Aspiration Pneumonia  Leukocytosis  Sepsis Mild lactic acidosis  Vomiting  R/o UGIB Prior CVA PEG dependent Aphonia  Bed bound Probable UTI   Discussion Having recurrent episodes of vomiting. Has been evaluated at [facility] for this. In past has been heme-neg but does appear coffee gd/charcoal colored. I think there is certainly a severe reflux issue here and may even be a component of esophagitis or gastritis resulting in UGIB. W/ associated RLL infiltrate and similar appearing aspirate from trach also dealing w/ RLL aspiration PNA at this point.  I think because this is a recurrent issue GI evaluation is warranted.  Plan Agree w/ hospital admit Sputum culture  IV unasyn  GI consult PPI  NPO Place PEG to gravity.  Would change foley  Has cuffed trach. This can stay for now in case GI wants to proceed w/ EGD, but he should go home w/ cuffless.   Best practice:  Diet: NPO Pain/Anxiety/Delirium protocol (if indicated): NA VAP protocol (if indicated): NA DVT prophylaxis: scd GI prophylaxis: PPI BID  Glucose control: NA Mobility: BR Code Status: full code  Family Communication: pending  Disposition: pending admit. Not critically ill. Awaiting GI consult. PCCM will see again on 9/16 for follow up.   Labs   CBC: Recent  Labs  Lab 12/31/18 0650  WBC 13.8*  NEUTROABS 10.1*  HGB 13.4  HCT 44.4  MCV 98.0  PLT [id number]    Basic Metabolic Panel: Recent Labs  Lab 12/31/18 0814  NA 143  K 4.6  CL 102  CO2 30  GLUCOSE 91  BUN 54*  CREATININE 1.00  CALCIUM 8.2*   GFR: CrCl cannot be calculated (Unknown ideal weight.). Recent Labs  Lab 12/31/18 0650 12/31/18 0804  WBC 13.8*  --   LATICACIDVEN  --  2.8*    Liver Function Tests: Recent Labs  Lab 12/31/18 0814  AST 18  ALT  14  ALKPHOS 70  BILITOT 0.3  PROT 7.1  ALBUMIN 2.6*   Recent Labs  Lab 12/31/18 0814  LIPASE 24   No results for input(s): AMMONIA in the last 168 hours.  ABG    Component Value Date/Time   PHART 7.378 04/09/2018 0847   PCO2ART 56.9 (H) 04/09/2018 0847   PO2ART 31.0 (LL) 04/09/2018 0847   HCO3 33.3 (H) 04/09/2018 0847   TCO2 35 (H) 04/09/2018 0847   ACIDBASEDEF 1.5 12/11/2016 0410   O2SAT 55.0 04/09/2018 0847     Coagulation Profile: No results for input(s): INR, PROTIME in the last 168 hours.  Cardiac Enzymes: No results for input(s): CKTOTAL, CKMB, CKMBINDEX, TROPONINI in the last 168 hours.  HbA1C: Hgb A1c MFr Bld  Date/Time Value Ref Range Status  12/09/2016 02:19 PM 5.7 (H) 4.8 - 5.6 % Final    Comment:    (NOTE) Pre diabetes:          5.7%-6.4% Diabetes:              >6.4% Glycemic control for   <7.0% adults with diabetes   10/21/2016 02:53 AM 5.4 4.8 - 5.6 % Final    Comment:    (NOTE)         Pre-diabetes: 5.7 - 6.4         Diabetes: >6.4         Glycemic control for adults with diabetes: <7.0     CBG: No results for input(s): GLUCAP in the last 168 hours.  Review of Systems:   Not able   Past Medical History  He,  has a past medical history of Acute pulmonary edema ([place]), Anemia due to chronic kidney disease, ARF (acute renal failure) ([place]) (10/19/2016), Aspiration pneumonia ([place]), Cerebellar infarct ([place]) (10/19/2016), Chronic kidney disease, stage 3 (moderate) ([place]), CKD (chronic kidney disease) stage 2, GFR 60-89 ml/min (01/02/2017), Disease characterized by destruction of skeletal muscle (10/15/2016), Gastrostomy tube obstruction ([place]), HCAP (healthcare-associated pneumonia), Helicobacter pylori infection (09/21/2012), History of adenomatous polyp of colon (07/28/2015), Hypertension, MDR Acinetobacter baumannii infection, Pneumothorax, closed, traumatic, initial encounter (12/10/2016), Renal insufficiency, Rhabdomyolysis (10/19/2016), S/P percutaneous  endoscopic gastrostomy (PEG) tube placement ([place]) (03/30/2017), 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   Surgical History    Past Surgical History:  Procedure Laterality Date  . IR GASTROSTOMY TUBE MOD SED  10/27/2016  . IR PATIENT EVAL TECH 0-60 MINS  01/05/2017  . IR PATIENT EVAL TECH 0-60 MINS  01/09/2017  . IR PATIENT EVAL TECH 0-60 MINS  03/03/2017  . IR PATIENT EVAL TECH 0-60 MINS  04/03/2017  . IR REPLACE G-TUBE SIMPLE WO FLUORO  11/20/2017  . IR [place] TUBE PERCUT W/FLUORO  07/23/2017  . KNEE SURGERY    . TRACHEOSTOMY TUBE PLACEMENT       Social History  reports that he has quit smoking. He has never used smokeless tobacco. He reports previous alcohol use. He reports previous drug use.   Family History   His family history includes Diabetes Mellitus II in his brother. There is no history of CAD or Stroke.   Allergies No Known Allergies   Home Medications  Prior to Admission medications   Medication Sig Start Date End Date Taking? Authorizing Provider  acetaminophen (TYLENOL) 500 MG tablet Place 1,000 mg into feeding tube 3 (three) times daily.    Yes [provider]  amLODipine (NORVASC) 10 MG tablet Place 1 tablet (10 mg total) into feeding tube daily. 11/23/17  Yes Kayleen Memos, DO  cloNIDine (CATAPRES - DOSED IN MG/24 HR) 0.1 mg/24hr patch Place 1 patch (0.1 mg total) onto the skin every Tuesday. 04/16/18  Yes Aline August, MD  Ferrous Sulfate (IRON) 325 (65 Fe) MG TABS Place 325 mg into feeding tube daily.    Yes [provider]  gabapentin (NEURONTIN) 100 MG capsule 100 mg See admin instructions. Open 1 capsule (100 mg) and administer contents via feeding tube three times daily   Yes [provider]  glycopyrrolate (ROBINUL) 1 MG tablet Place 1 mg into feeding tube 2 (two) times daily.  11/18/18  Yes [provider]  metoprolol tartrate  (LOPRESSOR) 50 MG tablet Take 1 tablet (50 mg total) by mouth 2 (two) times daily. 12/21/17  Yes Thurnell Lose, MD  mirtazapine (REMERON) 15 MG tablet Take 7.5 mg by mouth at bedtime. 12/24/18  Yes [provider]  montelukast (SINGULAIR) 10 MG tablet Place 10 mg into feeding tube daily.    Yes [provider]  ondansetron (ZOFRAN) 4 MG tablet Place 1 tablet (4 mg total) into feeding tube every 6 (six) hours as needed for nausea. 11/24/18  Yes Lucrezia Starch, MD  pravastatin (PRAVACHOL) 20 MG tablet Place 1 tablet (20 mg total) into feeding tube daily at 6 PM. Patient taking differently: Place 20 mg daily into feeding tube.  01/10/17  Yes Cherene Altes, MD  scopolamine (TRANSDERM-SCOP) 1 MG/3DAYS Place 1 mg onto the skin every 3 (three) days. 12/30/18  Yes [provider]  vitamin C (ASCORBIC ACID) 500 MG tablet Take 500 mg by mouth daily.   Yes [provider]  aspirin 81 MG chewable tablet Place 1 tablet (81 mg total) into feeding tube daily. Patient not taking: Reported on 11/19/2018 04/05/17   Nita Sells, MD  atropine 1 % ophthalmic solution Place 2 drops under the tongue 2 (two) times daily. Patient not taking: Reported on 11/19/2018 04/16/18   Aline August, MD  bisacodyl (DULCOLAX) 10 MG suppository Place 1 suppository (10 mg total) rectally daily. Patient not taking: Reported on 11/19/2018 12/22/17   Thurnell Lose, MD  clopidogrel (PLAVIX) 75 MG tablet Place 1 tablet (75 mg total) into feeding tube daily. Patient not taking: Reported on 12/31/2018 01/11/17   Cherene Altes, MD  ipratropium-albuterol (DUONEB) 0.5-2.5 (3) MG/3ML SOLN Take 3 mLs by nebulization every 4 (four) hours as needed. Patient not taking: Reported on 11/19/2018 01/10/17   Cherene Altes, MD  Nutritional Supplements (FEEDING SUPPLEMENT, JEVITY 1.2 CAL,) LIQD Place 1,000 mLs into feeding tube continuous. Patient not taking: Reported on 11/19/2018 11/22/17   Kayleen Memos,  DO  pantoprazole sodium (PROTONIX) 40 mg/20 mL PACK Place 20 mLs (40 mg total) into feeding tube 2 (two) times daily. Patient not taking: Reported on 11/19/2018 04/16/18  Aline August, MD  polyethylene glycol (MIRALAX) 17 g packet Take 17 g by mouth daily. Patient not taking: Reported on 12/31/2018 11/19/18   Dorie Rank, MD  thiamine 100 MG tablet Place 1 tablet (100 mg total) into feeding tube daily. Patient not taking: Reported on 11/19/2018 01/11/17   Cherene Altes, MD  Water For Irrigation, Sterile (FREE WATER) SOLN Place 100 mLs into feeding tube every 3 (three) hours. Patient not taking: Reported on 11/19/2018 04/16/18   Aline August, MD     Critical care time: NA    Erick Colace ACNP-BC [place] Pager # [phone] OR # [phone] if no answer

## 2018-12-31 NOTE — ED Provider Notes (Addendum)
Patient signed out to me by Dr. Dayna Barker for suspected aspiration pneumonia.  G-tube is functioning appropriate this time.  Does have an elevated lactate.  He has been started on empiric antibiotics prior to me seeing the patient.  Mild leukocytosis noted on patient's CBC.  No active bleeding from the trach site this time.  Will admit to the hospitalist service   Lacretia Leigh, MD 12/31/18 1242    Lacretia Leigh, MD 12/31/18 1242

## 2018-12-31 NOTE — H&P (View-Only) (Signed)
[facility] Consult: 3:13 PM 12/31/2018  LOS: 0 days    Referring Provider: Dr Verlon Au  Primary Care Physician:  [facility] Primary Gastroenterologist: unassigned    Reason for Consultation: [place], bloody fluid coming from the mouth and tracheostomy.   HPI: Scott Rivera is a 68 y.o. male.  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  Chronic, loculated pleural effusion.  Pressure ulcers.  Recurrent fecal impactions.  Chronic Plavix?.  CKD 3.  Aspiration pneumonia. December 2019 admission with black emesis and concern for perforated viscus which was treated conservatively along with treatment for pneumonia, hypoxia, respiratory failure.  CTAP showed possible esophagitis, fecal impaction.  Gastroenterology consultation not obtained. 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  Several ED visits for "vomiting into his trach tube".  "Frequent episodes of spitting or vomiting Alicia/brown emesis, multiple times daily despite patient tolerating tube feeds" per sister, his care provider.  Underwent CTAP without contrast on 11/19/2018 for evaluation of nausea, vomiting, ?abdominal pain.  Revealed no abdominopelvic abnormalities other than formed stool within distended rectum.  Also that same day his gastrostomy tube was replaced.  Several days of increased tracheostomy secretions initially tan-colored but darker in color this morning.  This morning it is reported he vomited red blood from his mouth and blood secretions from the trach.  In the ED they have suctioned maybe 100 cc of dark burgundy material from the mouth and trach.  Hgb is 13.4, was 14.5 five weeks ago but averages anywhere from 11-12.5. MCV normal, platelets normal.  WBCs  elevated at 13.8. BUN 54 with normal creatinine.  It was 25 five weeks ago. No FOB testing.    Other than slight decrease of albumin, his LFTs and lipase are normal.  Lactic acid is elevated at 2.8. COVID-19 ordered but not processed yet  Portable chest x-ray Shows stable tracheostomy tube.  Low lung volumes, mild bibasilar atelectasis/infiltrates.  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  No extraluminal contrast.  G tube in stomach/duodenal C loop.   Unable to obtain any review of systems from the patient.  He is calm, but does not follows commands, able to nod yes/no and nods "no" to "are you in pain?".  Last bowel movement unknown.  Frequency, volume, tolerance of tube feedings not known.  The pharmacists review of meds at home states the patient is not taking Plavix, aspirin, or PPI but is taking once daily iron sulfate.    Past Medical History:  Diagnosis Date  . Acute pulmonary edema (HCC)   . Anemia due to chronic kidney disease   . ARF (acute renal failure) ([place]) 10/19/2016  . Aspiration pneumonia ([place])   . Cerebellar infarct ([place]) 10/19/2016  . Chronic kidney disease, stage 3 (moderate) (HCC)   . CKD (chronic kidney disease) stage 2, GFR 60-89 ml/min 01/02/2017  . 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 Gastrostomy tube obstruction ([place])   .  HCAP (healthcare-associated pneumonia)   . Helicobacter pylori infection 09/21/2012  . History of adenomatous polyp of colon 07/28/2015  . Hypertension   . MDR Acinetobacter baumannii infection   . Pneumothorax, closed, traumatic, initial encounter 12/10/2016  . Renal insufficiency   . Rhabdomyolysis 10/19/2016  . S/P percutaneous endoscopic gastrostomy (PEG) tube placement ([place]) 03/30/2017  . Stage III pressure ulcer of sacral region ([place]) 12/17/2016  . Stroke ([place])   . UTI (urinary tract infection) 03/30/2017    Past Surgical  History:  Procedure Laterality Date  . IR GASTROSTOMY TUBE MOD SED  10/27/2016  . IR PATIENT EVAL TECH 0-60 MINS  01/05/2017  . IR PATIENT EVAL TECH 0-60 MINS  01/09/2017  . IR PATIENT EVAL TECH 0-60 MINS  03/03/2017  . IR PATIENT EVAL TECH 0-60 MINS  04/03/2017  . IR REPLACE G-TUBE SIMPLE WO FLUORO  11/20/2017  . IR [place] TUBE PERCUT W/FLUORO  07/23/2017  . KNEE SURGERY    . TRACHEOSTOMY TUBE PLACEMENT      Prior to Admission medications   Medication Sig Start Date End Date Taking? Authorizing Provider  acetaminophen (TYLENOL) 500 MG tablet Place 1,000 mg into feeding tube 3 (three) times daily.    Yes [provider]  amLODipine (NORVASC) 10 MG tablet Place 1 tablet (10 mg total) into feeding tube daily. 11/23/17  Yes Kayleen Memos, DO  cloNIDine (CATAPRES - DOSED IN MG/24 HR) 0.1 mg/24hr patch Place 1 patch (0.1 mg total) onto the skin every Tuesday. 04/16/18  Yes Aline August, MD  Ferrous Sulfate (IRON) 325 (65 Fe) MG TABS Place 325 mg into feeding tube daily.    Yes [provider]  gabapentin (NEURONTIN) 100 MG capsule 100 mg See admin instructions. Open 1 capsule (100 mg) and administer contents via feeding tube three times daily   Yes [provider]  glycopyrrolate (ROBINUL) 1 MG tablet Place 1 mg into feeding tube 2 (two) times daily.  11/18/18  Yes [provider]  metoprolol tartrate (LOPRESSOR) 50 MG tablet Take 1 tablet (50 mg total) by mouth 2 (two) times daily. 12/21/17  Yes Thurnell Lose, MD  mirtazapine (REMERON) 15 MG tablet Take 7.5 mg by mouth at bedtime. 12/24/18  Yes [provider]  montelukast (SINGULAIR) 10 MG tablet Place 10 mg into feeding tube daily.    Yes [provider]  ondansetron (ZOFRAN) 4 MG tablet Place 1 tablet (4 mg total) into feeding tube every 6 (six) hours as needed for nausea. 11/24/18  Yes Lucrezia Starch, MD  pravastatin (PRAVACHOL) 20 MG tablet Place 1 tablet (20 mg total) into  feeding tube daily at 6 PM. Patient taking differently: Place 20 mg daily into feeding tube.  01/10/17  Yes Cherene Altes, MD  scopolamine (TRANSDERM-SCOP) 1 MG/3DAYS Place 1 mg onto the skin every 3 (three) days. 12/30/18  Yes [provider]  vitamin C (ASCORBIC ACID) 500 MG tablet Take 500 mg by mouth daily.   Yes [provider]  aspirin 81 MG chewable tablet Place 1 tablet (81 mg total) into feeding tube daily. Patient not taking: Reported on 11/19/2018 04/05/17   Nita Sells, MD  atropine 1 % ophthalmic solution Place 2 drops under the tongue 2 (two) times daily. Patient not taking: Reported on 11/19/2018 04/16/18   Aline August, MD  bisacodyl (DULCOLAX) 10 MG suppository Place 1 suppository (10 mg total) rectally daily. Patient not taking: Reported on 11/19/2018 12/22/17   Thurnell Lose, MD  clopidogrel (PLAVIX) 75 MG tablet Place 1 tablet (75 mg total) into feeding tube daily. Patient not taking: Reported on 12/31/2018 01/11/17   Cherene Altes, MD  ipratropium-albuterol (DUONEB) 0.5-2.5 (3) MG/3ML SOLN Take 3 mLs by nebulization every 4 (four) hours as needed. Patient not taking: Reported on 11/19/2018 01/10/17   Cherene Altes, MD  Nutritional Supplements (FEEDING SUPPLEMENT, JEVITY 1.2 CAL,) LIQD Place 1,000 mLs into feeding tube continuous. Patient not taking: Reported on 11/19/2018 11/22/17   Kayleen Memos, DO  pantoprazole sodium (PROTONIX) 40 mg/20 mL PACK Place 20 mLs (40 mg total) into feeding tube 2 (two) times daily. Patient not taking: Reported on 11/19/2018 04/16/18   Aline August, MD  polyethylene glycol (MIRALAX) 17 g packet Take 17 g by mouth daily. Patient not taking: Reported on 12/31/2018 11/19/18   Dorie Rank, MD  thiamine 100 MG tablet Place 1 tablet (100 mg total) into feeding tube daily. Patient not taking: Reported on 11/19/2018 01/11/17   Cherene Altes, MD  Water For Irrigation, Sterile (FREE WATER) SOLN Place 100 mLs into feeding tube  every 3 (three) hours. Patient not taking: Reported on 11/19/2018 04/16/18   Aline August, MD    Scheduled Meds: . [START ON 01/01/2019] influenza vaccine adjuvanted  0.5 mL Intramuscular Tomorrow-1000  . [START ON 01/04/2019] pantoprazole  40 mg Intravenous Q12H   Infusions: . pantoprazole (PROTONIX) IVPB    . pantoprozole (PROTONIX) infusion    . sodium chloride     PRN Meds:    Allergies as of 12/31/2018  . (No Known Allergies)    Family History  Problem Relation Age of Onset  . Diabetes Mellitus II Brother   . CAD Neg Hx   . Stroke Neg Hx     Social History   Socioeconomic History  . Marital status: Single    Spouse name: Not on file  . Number of children: Not on file  . Years of education: Not on file  . Highest education level: Not on file  Occupational History  . Not on file  Social Needs  . Financial resource strain: Not hard at all  . Food insecurity    Worry: Not on file    Inability: Never true  . Transportation needs    Medical: No    Non-medical: No  Tobacco Use  . Smoking status: Former Research scientist (life sciences)  . Smokeless tobacco: Never Used  . Tobacco comment: Smoked heavily until the day of his stroke  Substance and Sexual Activity  . Alcohol use: Not Currently    Comment: Drank heavily prior to stroke per sister  . Drug use: Not Currently  . Sexual activity: Not Currently  Lifestyle  . Physical activity    Days per week: Not on file    Minutes per session: Not on file  . Stress: Not on file  Relationships  . Social Herbalist on phone: Not on file    Gets together: Not on file    Attends religious service: Not on file    Active member of club or organization: Not on file    Attends meetings of clubs or organizations: Not on file    Relationship status: Not on file  . Intimate partner violence    Fear of current or ex partner: Not on file    Emotionally abused: Not on file    Physically abused: Not on file    Forced sexual activity: Not  on file  Other Topics Concern  .  Not on file  Social History Narrative   Lives at home with sister and private home care    REVIEW OF SYSTEMS: Unable to obtain review of systems, see HPI.   PHYSICAL EXAM: Vital signs in last 24 hours: Vitals:   12/31/18 1400 12/31/18 1442  BP: 117/68   Pulse: 86 92  Resp: 18 20  Temp:    SpO2: 96% 97%   Wt Readings from Last 3 Encounters:  11/19/18 72.6 kg  04/16/18 74.2 kg  02/03/18 74.1 kg    General: Chronically ill looking AAM who is resting quietly on the stretcher.  Does not get agitated or more animated during exam which included turning him and rectal exam.  Suction canister shows about 50 cc of burgundy dark liquid. Head: No facial asymmetry or swelling.  No signs of head trauma. Eyes: No conjunctival pallor. Ears: Not able to assess his hearing. Nose: No discharge or congestion. Mouth: Examined the oral cavity.  There is no fresh blood and no suspicious lesions that could have caused bleeding.  Only about 5 lower front teeth remain. Neck: Tracheostomy site looks benign.  Do not see blood either dried or fresh. Lungs: No labored breathing, no cough. Heart: RRR.  No MRG.  S1, S2 present Abdomen: Slightly distended and slightly firm.  No tenderness.  Bowel sounds active.  G-tube site benign, no blood or secretions at the site.  Use the suction apparatus at the G-tube port and all that came out was a pale amber-colored liquid, no blood..   GU: What looks like a chronic indwelling Foley is in position at the meatus. Rectal: Soft stool in the rectal vault is light brown.  Did not test this for fecal occult blood.  A little bit of skin breakdown but no deep pressure ulcers in the peri-rectum. Musc/Skeltl: Marked enlargement with slight swelling of the left knee associated with valgus deformity.  Varus deformity of left >> right leg.  Hard large, raised  bump/nodule on right shin bone.   No ulcers on the feet. Extremities: No CCE.  Limbs in  general atrophic.  Fingers fixed in a clutched position Neurologic: Did not speak as I questioned or examined him.  Did not test limb movement or strength.  No tremors. Skin: Skin is generally dry. Tattoos: None observed   Intake/Output from previous day: No intake/output data recorded. Intake/Output this shift: Total I/O In: 2400 [IV Piggyback:2400] Out: -   LAB RESULTS: Recent Labs    12/31/18 0650  WBC 13.8*  HGB 13.4  HCT 44.4  PLT 366   BMET Lab Results  Component Value Date   NA 143 12/31/2018   NA 137 11/24/2018   NA 137 11/19/2018   K 4.6 12/31/2018   K 4.6 11/24/2018   K 5.0 11/19/2018   CL 102 12/31/2018   CL 98 11/24/2018   CL 101 11/19/2018   CO2 30 12/31/2018   CO2 26 11/24/2018   CO2 27 11/19/2018   GLUCOSE 91 12/31/2018   GLUCOSE 91 11/24/2018   GLUCOSE 93 11/19/2018   BUN 54 (H) 12/31/2018   BUN 25 (H) 11/24/2018   BUN 28 (H) 11/19/2018   CREATININE 1.00 12/31/2018   CREATININE 1.08 11/24/2018   CREATININE 1.09 11/19/2018   CALCIUM 8.2 (L) 12/31/2018   CALCIUM 9.8 11/24/2018   CALCIUM 9.8 11/19/2018   LFT Recent Labs    12/31/18 0814  PROT 7.1  ALBUMIN 2.6*  AST 18  ALT 14  ALKPHOS 70  BILITOT  0.3   PT/INR Lab Results  Component Value Date   INR 1.03 12/17/2017   INR 1.17 03/30/2017   INR 0.99 01/02/2017   Hepatitis Panel No results for input(s): HEPBSAG, HCVAB, HEPAIGM, HEPBIGM in the last 72 hours. C-Diff No components found for: CDIFF Lipase     Component Value Date/Time   LIPASE 24 12/31/2018 0814    Drugs of Abuse     Component Value Date/Time   LABOPIA NONE DETECTED 10/19/2016 0050   COCAINSCRNUR NONE DETECTED 10/19/2016 0050   LABBENZ NONE DETECTED 10/19/2016 0050   AMPHETMU NONE DETECTED 10/19/2016 0050   THCU NONE DETECTED 10/19/2016 0050   LABBARB NONE DETECTED 10/19/2016 0050     RADIOLOGY STUDIES: Dg Abdomen Peg Tube Location  Result Date: 12/31/2018 CLINICAL DATA:  68 year old male with PEG tube.  Evaluate for patency. EXAM: ABDOMEN - 1 VIEW COMPARISON:  Abdominal radiograph dated 11/18/2018 FINDINGS: Contrast injected via the percutaneous gastrostomy is noted within the stomach and duodenal C-loop. No extraluminal contrast identified. There is large amount of stool throughout the colon. No bowel dilatation. There is degenerative changes of the spine. Midline surgical staples noted. IMPRESSION: Percutaneous gastrostomy within the distal stomach. No extraluminal contrast identified. Electronically Signed   By: Anner Crete M.D.   On: 12/31/2018 09:44   Dg Chest Portable 1 View  Result Date: 12/31/2018 CLINICAL DATA:  Increased congestion. EXAM: PORTABLE CHEST 1 VIEW COMPARISON:  11/24/2018. FINDINGS: Tracheostomy tube stable position. Heart size normal. Low lung volumes with mild bibasilar atelectasis/infiltrates. No pleural effusion or pneumothorax. Surgical clips left upper scratched it surgical clips left chest. 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 IMPRESSION: 1.  Tracheostomy tube stable position. 2.  Low lung volumes with mild bibasilar atelectasis/infiltrates. 3. Sclerotic changes left humeral head consistent with avascular necrosis. Electronically Signed   By: [place]   On: 12/31/2018 06:25     IMPRESSION:   *   Dark bloody oral and trach secretions.  Nonbloody, pale amber-colored G-tube secretions. Not clear that patient has active GI bleed but normally would expect to see some blood in the gastric secretions or melena in the stool with a BUN of 50.  *     Azotemia with normal creatinine.  *     Tracheostomy and gastrostomy tube dependent. Gastric tube replaced 11/19/2018.  No reports of issues with G-tube or tube feedings.  *     Sepsis criteria.  ? Aspiration PNA, though CXR not overwhelming for PNA.   Received Rocephin, Zithromax, clindamycin.  Zosyn to begin tonight.  *   Constipation in patient with of fecal impaction,  obstipation.  *      ?Chronic Plavix per prior hx.  However today's updated med list says the patient is not taking this medication.  PLAN:     *   Does not need Protonix drip.  Change the order to Protonix 40 mg IV twice daily.  *   Would obtain a clean-catch urine to rule out UTI.  However urine is going to be difficult to interpret in a patient with indwelling Foley catheter.  *     Plan EGD tomorrow.    *   COVID-19 test ordered, epic says this is in process.   *    Miralax, 1 dose q 4 hours x 3 via tube     Azucena Freed  12/31/2018, 3:13 PM Phone [phone]

## 2018-12-31 NOTE — Progress Notes (Signed)
Pharmacy Antibiotic Note  Scott Rivera is a 68 y.o. male admitted on 12/31/2018 with pneumonia.  Pharmacy has been consulted for Zosyn dosing. Hx Pseudomonas PNA in Dec 2019. Renal function at patient's baseline; CrCl>31ml/min.  Plan: Zosyn 3.375gm IV Q8h to be infused over 4hrs No dose adjustments anticipated.  Pharmacy to sign off & monitor peripherally via electronic surveillance software.      Temp (24hrs), Avg:99.5 F (37.5 C), Min:99.2 F (37.3 C), Max:99.7 F (37.6 C)  Recent Labs  Lab 12/31/18 0650 12/31/18 0804 12/31/18 0814  WBC 13.8*  --   --   CREATININE  --   --  1.00  LATICACIDVEN  --  2.8*  --     CrCl cannot be calculated (Unknown ideal weight.).    No Known Allergies  Antimicrobials this admission: 9/15 Rocephin/Zithromax/Clindamycin x1 in ED 9/15 Zosyn >>   Dose adjustments this admission:  Microbiology results: 9/15 BCx:  Sputum:   Thank you for allowing pharmacy to be a part of this patient's care.  Netta Cedars 12/31/2018 3:04 PM

## 2018-12-31 NOTE — ED Notes (Signed)
Pt uncooperative, refuses to allow blood draw, will withdrawal arm and pull away each time attempted to draw labs.

## 2018-12-31 NOTE — ED Notes (Signed)
Rpt called to Haley RN 

## 2018-12-31 NOTE — ED Notes (Signed)
ED TO INPATIENT HANDOFF REPORT  Name/Age/Gender Scott Rivera 68 y.o. male  Code Status Code Status History    Date Active Date Inactive Code Status Order ID Comments User Context   04/09/2018 1657 04/17/2018 0132 Full Code [id number]  Aline August, MD Inpatient   12/18/2017 0204 12/21/2017 1636 Full Code [id number]  Ivor Costa, MD ED   11/16/2017 1854 11/22/2017 2052 Full Code [id number]  Phillips Grout, MD ED   06/27/2017 0019 07/03/2017 1641 Full Code [id number]  Vianne Bulls, MD ED   03/31/2017 1143 04/04/2017 1713 Full Code [id number]  Debbe Odea, MD Inpatient   03/02/2017 2359 03/07/2017 2252 Full Code [id number]  Toy Baker, MD Inpatient   12/22/2016 1351 01/10/2017 1708 Full Code [id number]  Donita Brooks, NP ED   12/09/2016 1139 12/21/2016 1822 Full Code [id number]  Lady Deutscher, MD Inpatient   10/19/2016 2100 11/29/2016 1620 Full Code [id number]  Rise Patience, MD Inpatient   Advance Care Planning Activity      Home/SNF/Other Skilled nursing facility  Chief Complaint Tracheotomy  Level of Care/Admitting Diagnosis ED Disposition    ED Disposition Condition [place]: [facility] [100102]  Level of Care: Stepdown [14]  Admit to SDU based on following criteria: Respiratory Distress:  Frequent assessment and/or intervention to maintain adequate ventilation/respiration, pulmonary toilet, and respiratory treatment.  Covid Evaluation: Asymptomatic Screening Protocol (No Symptoms)  Diagnosis: GI bleed [id number]  Admitting Physician: Nita Sells [phone]  Attending Physician: Nita Sells [phone]  Estimated length of stay: 3 - 4 days  Certification:: I certify this patient will need inpatient services for at least 2 midnights  PT Class (Do Not Modify): Inpatient [101]  PT Acc Code (Do Not Modify): Private [1]       Medical History Past Medical History:  Diagnosis Date  . Acute pulmonary edema (HCC)   .  Anemia due to chronic kidney disease   . ARF (acute renal failure) ([place]) 10/19/2016  . Aspiration pneumonia ([place])   . Cerebellar infarct ([place]) 10/19/2016  . Chronic kidney disease, stage 3 (moderate) (HCC)   . CKD (chronic kidney disease) stage 2, GFR 60-89 ml/min 01/02/2017  . 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 Gastrostomy tube obstruction ([place])   . HCAP (healthcare-associated pneumonia)   . Helicobacter pylori infection 09/21/2012  . History of adenomatous polyp of colon 07/28/2015  . Hypertension   . MDR Acinetobacter baumannii infection   . Pneumothorax, closed, traumatic, initial encounter 12/10/2016  . Renal insufficiency   . Rhabdomyolysis 10/19/2016  . S/P percutaneous endoscopic gastrostomy (PEG) tube placement ([place]) 03/30/2017  . 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 Stroke ([place])   . UTI (urinary tract infection) 03/30/2017    Allergies No Known Allergies  IV Location/Drains/Wounds Patient Lines/Drains/Airways Status   Active Line/Drains/Airways    Name:   Placement date:   Placement time:   Site:   Days:   Peripheral IV 12/31/18 Right Hand   12/31/18    0650    Hand   less than 1   Peripheral IV 12/31/18 Right Antecubital   12/31/18    0810    Antecubital   less than 1   Gastrostomy/Enterostomy Percutaneous endoscopic gastrostomy (PEG) LLQ   11/14/16    -    LLQ   777   Urethral Catheter [name] 16 Fr.   04/15/18    1220    -  260   Tracheostomy [place] 6 mm Laryngectomy;Uncuffed   05/19/18    2205    6 mm   226   Tracheostomy [place] 6 mm Uncuffed   -    -    6 mm      Tracheostomy [place] 6 mm Cuffed   12/31/18    0535    6 mm   less than 1   Pressure Injury 04/15/18 Stage II -  Partial thickness loss of dermis presenting as a shallow open ulcer with a red, pink wound bed without slough.   04/15/18    1000     260   Wound / Incision (Open or Dehisced) small blister pustules on back arms and abdomen   -    -    -              Labs/Imaging Results for orders placed or performed during the hospital encounter of 12/31/18 (from the past 48 hour(s))  CBC with Differential     Status: Abnormal   Collection Time: 12/31/18  6:50 AM  Result Value Ref Range   WBC 13.8 (H) 4.0 - 10.5 K/uL   RBC 4.53 4.22 - 5.81 MIL/uL   Hemoglobin 13.4 13.0 - 17.0 g/dL   HCT 44.4 39.0 - 52.0 %   MCV 98.0 80.0 - 100.0 fL   MCH 29.6 26.0 - 34.0 pg   MCHC 30.2 30.0 - 36.0 g/dL   RDW 13.9 11.5 - 15.5 %   Platelets 366 150 - 400 K/uL   nRBC 0.0 0.0 - 0.2 %   Neutrophils Relative % 73 %   Neutro Abs 10.1 (H) 1.7 - 7.7 K/uL   Lymphocytes Relative 12 %   Lymphs Abs 1.7 0.7 - 4.0 K/uL   Monocytes Relative 12 %   Monocytes Absolute 1.6 (H) 0.1 - 1.0 K/uL   Eosinophils Relative 2 %   Eosinophils Absolute 0.2 0.0 - 0.5 K/uL   Basophils Relative 0 %   Basophils Absolute 0.1 0.0 - 0.1 K/uL   Immature Granulocytes 1 %   Abs Immature Granulocytes 0.07 0.00 - 0.07 K/uL    Comment: Performed at [facility], [place] [address], [place], [place] [address]  pH, gastric fluid (gastroccult card)     Status: None   Collection Time: 12/31/18  7:11 AM  Result Value Ref Range   pH, Gastric 4     Comment: Performed at [facility], [place] [address], [place], [place] [address]  Lactic acid, plasma     Status: Abnormal   Collection Time: 12/31/18  8:04 AM  Result Value Ref Range   Lactic Acid, Venous 2.8 (HH) 0.5 - 1.9 mmol/L    Comment: CRITICAL RESULT CALLED TO, READ BACK BY AND VERIFIED WITHAsher Muir RN AT [id number] 12/31/18 MULLINS,T Performed at [facility], [place] [address], [place], [place] [address]   Comprehensive metabolic panel     Status: Abnormal   Collection Time: 12/31/18  8:14 AM  Result Value Ref Range   Sodium 143 135 - 145 mmol/L   Potassium 4.6 3.5 - 5.1 mmol/L   Chloride 102 98 - 111 mmol/L   CO2 30 22 - 32 mmol/L   Glucose, Bld 91 70 - 99 mg/dL   BUN 54 (H) 8 - 23 mg/dL    Creatinine, Ser 1.00 0.61 - 1.24 mg/dL   Calcium 8.2 (L) 8.9 - 10.3 mg/dL   Total Protein 7.1 6.5 - 8.1 g/dL  Albumin 2.6 (L) 3.5 - 5.0 g/dL   AST 18 15 - 41 U/L   ALT 14 0 - 44 U/L   Alkaline Phosphatase 70 38 - 126 U/L   Total Bilirubin 0.3 0.3 - 1.2 mg/dL   GFR calc non Af Amer >60 >60 mL/min   GFR calc Af Amer >60 >60 mL/min   Anion gap 11 5 - 15    Comment: Performed at [facility], [place] [address] [place], [place] [address]  Lipase, blood     Status: None   Collection Time: 12/31/18  8:14 AM  Result Value Ref Range   Lipase 24 11 - 51 U/L    Comment: Performed at [facility], [place] [address] [place], [place] [address]   Dg Abdomen Peg Tube Location  Result Date: 12/31/2018 CLINICAL DATA:  68 year old male with PEG tube. Evaluate for patency. EXAM: ABDOMEN - 1 VIEW COMPARISON:  Abdominal radiograph dated 11/18/2018 FINDINGS: Contrast injected via the percutaneous gastrostomy is noted within the stomach and duodenal C-loop. No extraluminal contrast identified. There is large amount of stool throughout the colon. No bowel dilatation. There is degenerative changes of the spine. Midline surgical staples noted. IMPRESSION: Percutaneous gastrostomy within the distal stomach. No extraluminal contrast identified. Electronically Signed   By: Anner Crete M.D.   On: 12/31/2018 09:44   Dg Chest Portable 1 View  Result Date: 12/31/2018 CLINICAL DATA:  Increased congestion. EXAM: PORTABLE CHEST 1 VIEW COMPARISON:  11/24/2018. FINDINGS: Tracheostomy tube stable position. Heart size normal. Low lung volumes with mild bibasilar atelectasis/infiltrates. No pleural effusion or pneumothorax. Surgical clips left upper scratched it surgical clips left chest. 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 IMPRESSION: 1.  Tracheostomy tube stable position. 2.  Low lung volumes with mild bibasilar atelectasis/infiltrates. 3. 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 Electronically Signed   By: Marcello Moores  Register   On: 12/31/2018 06:25    Pending Labs Unresulted Labs (From admission, onward)    Start     Ordered   01/01/19 0500  Procalcitonin  Daily,   R     12/31/18 1446   12/31/18 1448  Culture, respiratory (non-expectorated)  Once,   STAT     12/31/18 1448   12/31/18 1447  Hemoglobin and hematocrit, blood  ONCE - STAT,   STAT     12/31/18 1446   12/31/18 1447  Type and screen [place],   STAT    Comments: [place]    12/31/18 1446   12/31/18 1447  Procalcitonin - Baseline  ONCE - STAT,   STAT     12/31/18 1446   12/31/18 0722  SARS CORONAVIRUS 2 (TAT 6-24 HRS) Nasopharyngeal Nasopharyngeal Swab  (Asymptomatic/Tier 2 Patients Labs)  Once,   STAT    Question Answer Comment  Is this test for diagnosis or screening Screening   Symptomatic for COVID-19 as defined by CDC No   Hospitalized for COVID-19 No   Admitted to ICU for COVID-19 No   Previously tested for COVID-19 No   Resident in a congregate (group) care setting No   Employed in healthcare setting No      12/31/18 0722   12/31/18 0720  Blood culture (routine x 2)  BLOOD CULTURE X 2,   STAT     12/31/18 0719   12/31/18 0604  Lactic acid, plasma  Now then every 2 hours,  STAT     12/31/18 0603   Signed and Held  HIV antibody (Routine Testing)  Once,   R     Signed and Held   Signed and Held  HIV antibody (Routine Screening)  Once,   R     Signed and Held   Signed and Held  CBC WITH DIFFERENTIAL  Once,   R     Signed and Held   Signed and Held  Comprehensive metabolic panel  Tomorrow morning,   R     Signed and Held   Signed and Held  CBC  Tomorrow morning,   R     Signed and Held   Signed and Held  Protime-INR  Tomorrow morning,   R     Signed and Held   Signed and Held  Culture, sputum-assessment  Once,   R     Signed and Held          Vitals/Pain Today's  Vitals   12/31/18 1445 12/31/18 1500 12/31/18 1515 12/31/18 1530  BP:  124/64  (!) 112/59  Pulse: 92 87 88 88  Resp:    20  Temp:      TempSrc:      SpO2: 97% 97% 97% 97%  PainSc:        Isolation Precautions No active isolations  Medications Medications  influenza vaccine adjuvanted (FLUAD) injection 0.5 mL (has no administration in time range)  piperacillin-tazobactam (ZOSYN) IVPB 3.375 g (has no administration in time range)  pantoprazole (PROTONIX) injection 40 mg (has no administration in time range)  cefTRIAXone (ROCEPHIN) 1 g in sodium chloride 0.9 % 100 mL IVPB (0 g Intravenous Stopped 12/31/18 0902)  azithromycin (ZITHROMAX) 500 mg in sodium chloride 0.9 % 250 mL IVPB (0 mg Intravenous Stopped 12/31/18 0913)  clindamycin (CLEOCIN) IVPB 600 mg (0 mg Intravenous Stopped 12/31/18 0945)  iohexol (OMNIPAQUE) 300 MG/ML solution 30 mL (30 mLs Per Tube Contrast Given 12/31/18 0939)  sodium chloride 0.9 % bolus 1,000 mL (0 mLs Intravenous Stopped 12/31/18 1508)    And  sodium chloride 0.9 % bolus 1,000 mL (0 mLs Intravenous Stopped 12/31/18 1508)    And  sodium chloride 0.9 % bolus 250 mL (0 mLs Intravenous Stopped 12/31/18 1545)    Mobility non-ambulatory

## 2018-12-31 NOTE — Consult Note (Addendum)
[facility] Consult: 3:13 PM 12/31/2018  LOS: 0 days    Referring Provider: Dr Verlon Au  Primary Care Physician:  [facility] [place] Primary Gastroenterologist: unassigned    Reason for Consultation: [place], bloody fluid coming from the mouth and tracheostomy.   HPI: Scott Rivera is a 68 y.o. male.  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  Chronic, loculated pleural effusion.  Pressure ulcers.  Recurrent fecal impactions.  Chronic Plavix?.  CKD 3.  Aspiration pneumonia. December 2019 admission with black emesis and concern for perforated viscus which was treated conservatively along with treatment for pneumonia, hypoxia, respiratory failure.  CTAP showed possible esophagitis, fecal impaction.  Gastroenterology consultation not obtained. 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  Several ED visits for "vomiting into his trach tube".  "Frequent episodes of spitting or vomiting [place]/brown emesis, multiple times daily despite patient tolerating tube feeds" per sister, his care provider.  Underwent CTAP without contrast on 11/19/2018 for evaluation of nausea, vomiting, ?abdominal pain.  Revealed no abdominopelvic abnormalities other than formed stool within distended rectum.  Also that same day his gastrostomy tube was replaced.  Several days of increased tracheostomy secretions initially tan-colored but darker in color this morning.  This morning it is reported he vomited red blood from his mouth and blood secretions from the trach.  In the ED they have suctioned maybe 100 cc of dark burgundy material from the mouth and trach.  Hgb is 13.4, was 14.5 five weeks ago but averages anywhere from 11-12.5. MCV normal, platelets normal.  WBCs  elevated at 13.8. BUN 54 with normal creatinine.  It was 25 five weeks ago. No FOB testing.    Other than slight decrease of albumin, his LFTs and lipase are normal.  Lactic acid is elevated at 2.8. COVID-19 ordered but not processed yet  Portable chest x-ray Shows stable tracheostomy tube.  Low lung volumes, mild bibasilar atelectasis/infiltrates.  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  No extraluminal contrast.  G tube in stomach/duodenal C loop.   Unable to obtain any review of systems from the patient.  He is calm, but does not follows commands, able to nod yes/no and nods "no" to "are you in pain?".  Last bowel movement unknown.  Frequency, volume, tolerance of tube feedings not known.  The pharmacists review of meds at home states the patient is not taking Plavix, aspirin, or PPI but is taking once daily iron sulfate.    Past Medical History:  Diagnosis Date  . Acute pulmonary edema (HCC)   . Anemia due to chronic kidney disease   . ARF (acute renal failure) ([place]) 10/19/2016  . Aspiration pneumonia ([place])   . Cerebellar infarct ([place]) 10/19/2016  . Chronic kidney disease, stage 3 (moderate) (HCC)   . CKD (chronic kidney disease) stage 2, GFR 60-89 ml/min 01/02/2017  . 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 Gastrostomy tube obstruction ([place])   .  HCAP (healthcare-associated pneumonia)   . Helicobacter pylori infection 09/21/2012  . History of adenomatous polyp of colon 07/28/2015  . Hypertension   . MDR Acinetobacter baumannii infection   . Pneumothorax, closed, traumatic, initial encounter 12/10/2016  . Renal insufficiency   . Rhabdomyolysis 10/19/2016  . S/P percutaneous endoscopic gastrostomy (PEG) tube placement ([place]) 03/30/2017  . Stage III pressure ulcer of sacral region ([place]) 12/17/2016  . Stroke ([place])   . UTI (urinary tract infection) 03/30/2017    Past Surgical  History:  Procedure Laterality Date  . IR GASTROSTOMY TUBE MOD SED  10/27/2016  . IR PATIENT EVAL TECH 0-60 MINS  01/05/2017  . IR PATIENT EVAL TECH 0-60 MINS  01/09/2017  . IR PATIENT EVAL TECH 0-60 MINS  03/03/2017  . IR PATIENT EVAL TECH 0-60 MINS  04/03/2017  . IR REPLACE G-TUBE SIMPLE WO FLUORO  11/20/2017  . IR [place] TUBE PERCUT W/FLUORO  07/23/2017  . KNEE SURGERY    . TRACHEOSTOMY TUBE PLACEMENT      Prior to Admission medications   Medication Sig Start Date End Date Taking? Authorizing Provider  acetaminophen (TYLENOL) 500 MG tablet Place 1,000 mg into feeding tube 3 (three) times daily.    Yes [provider]  amLODipine (NORVASC) 10 MG tablet Place 1 tablet (10 mg total) into feeding tube daily. 11/23/17  Yes Kayleen Memos, DO  cloNIDine (CATAPRES - DOSED IN MG/24 HR) 0.1 mg/24hr patch Place 1 patch (0.1 mg total) onto the skin every Tuesday. 04/16/18  Yes Aline August, MD  Ferrous Sulfate (IRON) 325 (65 Fe) MG TABS Place 325 mg into feeding tube daily.    Yes [provider]  gabapentin (NEURONTIN) 100 MG capsule 100 mg See admin instructions. Open 1 capsule (100 mg) and administer contents via feeding tube three times daily   Yes [provider]  glycopyrrolate (ROBINUL) 1 MG tablet Place 1 mg into feeding tube 2 (two) times daily.  11/18/18  Yes [provider]  metoprolol tartrate (LOPRESSOR) 50 MG tablet Take 1 tablet (50 mg total) by mouth 2 (two) times daily. 12/21/17  Yes Thurnell Lose, MD  mirtazapine (REMERON) 15 MG tablet Take 7.5 mg by mouth at bedtime. 12/24/18  Yes [provider]  montelukast (SINGULAIR) 10 MG tablet Place 10 mg into feeding tube daily.    Yes [provider]  ondansetron (ZOFRAN) 4 MG tablet Place 1 tablet (4 mg total) into feeding tube every 6 (six) hours as needed for nausea. 11/24/18  Yes Lucrezia Starch, MD  pravastatin (PRAVACHOL) 20 MG tablet Place 1 tablet (20 mg total) into  feeding tube daily at 6 PM. Patient taking differently: Place 20 mg daily into feeding tube.  01/10/17  Yes Cherene Altes, MD  scopolamine (TRANSDERM-SCOP) 1 MG/3DAYS Place 1 mg onto the skin every 3 (three) days. 12/30/18  Yes [provider]  vitamin C (ASCORBIC ACID) 500 MG tablet Take 500 mg by mouth daily.   Yes [provider]  aspirin 81 MG chewable tablet Place 1 tablet (81 mg total) into feeding tube daily. Patient not taking: Reported on 11/19/2018 04/05/17   Nita Sells, MD  atropine 1 % ophthalmic solution Place 2 drops under the tongue 2 (two) times daily. Patient not taking: Reported on 11/19/2018 04/16/18   Aline August, MD  bisacodyl (DULCOLAX) 10 MG suppository Place 1 suppository (10 mg total) rectally daily. Patient not taking: Reported on 11/19/2018 12/22/17   Thurnell Lose, MD  clopidogrel (PLAVIX) 75 MG tablet Place 1 tablet (75 mg total) into feeding tube daily. Patient not taking: Reported on 12/31/2018 01/11/17   Cherene Altes, MD  ipratropium-albuterol (DUONEB) 0.5-2.5 (3) MG/3ML SOLN Take 3 mLs by nebulization every 4 (four) hours as needed. Patient not taking: Reported on 11/19/2018 01/10/17   Cherene Altes, MD  Nutritional Supplements (FEEDING SUPPLEMENT, JEVITY 1.2 CAL,) LIQD Place 1,000 mLs into feeding tube continuous. Patient not taking: Reported on 11/19/2018 11/22/17   Kayleen Memos, DO  pantoprazole sodium (PROTONIX) 40 mg/20 mL PACK Place 20 mLs (40 mg total) into feeding tube 2 (two) times daily. Patient not taking: Reported on 11/19/2018 04/16/18   Aline August, MD  polyethylene glycol (MIRALAX) 17 g packet Take 17 g by mouth daily. Patient not taking: Reported on 12/31/2018 11/19/18   Dorie Rank, MD  thiamine 100 MG tablet Place 1 tablet (100 mg total) into feeding tube daily. Patient not taking: Reported on 11/19/2018 01/11/17   Cherene Altes, MD  Water For Irrigation, Sterile (FREE WATER) SOLN Place 100 mLs into feeding tube  every 3 (three) hours. Patient not taking: Reported on 11/19/2018 04/16/18   Aline August, MD    Scheduled Meds: . [START ON 01/01/2019] influenza vaccine adjuvanted  0.5 mL Intramuscular Tomorrow-1000  . [START ON 01/04/2019] pantoprazole  40 mg Intravenous Q12H   Infusions: . pantoprazole (PROTONIX) IVPB    . pantoprozole (PROTONIX) infusion    . sodium chloride     PRN Meds:    Allergies as of 12/31/2018  . (No Known Allergies)    Family History  Problem Relation Age of Onset  . Diabetes Mellitus II Brother   . CAD Neg Hx   . Stroke Neg Hx     Social History   Socioeconomic History  . Marital status: Single    Spouse name: Not on file  . Number of children: Not on file  . Years of education: Not on file  . Highest education level: Not on file  Occupational History  . Not on file  Social Needs  . Financial resource strain: Not hard at all  . Food insecurity    Worry: Not on file    Inability: Never true  . Transportation needs    Medical: No    Non-medical: No  Tobacco Use  . Smoking status: Former Research scientist (life sciences)  . Smokeless tobacco: Never Used  . Tobacco comment: Smoked heavily until the day of his stroke  Substance and Sexual Activity  . Alcohol use: Not Currently    Comment: Drank heavily prior to stroke per sister  . Drug use: Not Currently  . Sexual activity: Not Currently  Lifestyle  . Physical activity    Days per week: Not on file    Minutes per session: Not on file  . Stress: Not on file  Relationships  . Social Herbalist on phone: Not on file    Gets together: Not on file    Attends religious service: Not on file    Active member of club or organization: Not on file    Attends meetings of clubs or organizations: Not on file    Relationship status: Not on file  . Intimate partner violence    Fear of current or ex partner: Not on file    Emotionally abused: Not on file    Physically abused: Not on file    Forced sexual activity: Not  on file  Other Topics Concern  .  Not on file  Social History Narrative   Lives at home with sister and private home care    REVIEW OF SYSTEMS: Unable to obtain review of systems, see HPI.   PHYSICAL EXAM: Vital signs in last 24 hours: Vitals:   12/31/18 1400 12/31/18 1442  BP: 117/68   Pulse: 86 92  Resp: 18 20  Temp:    SpO2: 96% 97%   Wt Readings from Last 3 Encounters:  11/19/18 72.6 kg  04/16/18 74.2 kg  02/03/18 74.1 kg    General: Chronically ill looking AAM who is resting quietly on the stretcher.  Does not get agitated or more animated during exam which included turning him and rectal exam.  Suction canister shows about 50 cc of burgundy dark liquid. Head: No facial asymmetry or swelling.  No signs of head trauma. Eyes: No conjunctival pallor. Ears: Not able to assess his hearing. Nose: No discharge or congestion. Mouth: Examined the oral cavity.  There is no fresh blood and no suspicious lesions that could have caused bleeding.  Only about 5 lower front teeth remain. Neck: Tracheostomy site looks benign.  Do not see blood either dried or fresh. Lungs: No labored breathing, no cough. Heart: RRR.  No MRG.  S1, S2 present Abdomen: Slightly distended and slightly firm.  No tenderness.  Bowel sounds active.  G-tube site benign, no blood or secretions at the site.  Use the suction apparatus at the G-tube port and all that came out was a pale amber-colored liquid, no blood..   GU: What looks like a chronic indwelling Foley is in position at the meatus. Rectal: Soft stool in the rectal vault is light brown.  Did not test this for fecal occult blood.  A little bit of skin breakdown but no deep pressure ulcers in the peri-rectum. Musc/Skeltl: Marked enlargement with slight swelling of the left knee associated with valgus deformity.  Varus deformity of left >> right leg.  Hard large, raised  bump/nodule on right shin bone.   No ulcers on the feet. Extremities: No CCE.  Limbs in  general atrophic.  Fingers fixed in a clutched position Neurologic: Did not speak as I questioned or examined him.  Did not test limb movement or strength.  No tremors. Skin: Skin is generally dry. Tattoos: None observed   Intake/Output from previous day: No intake/output data recorded. Intake/Output this shift: Total I/O In: 2400 [IV Piggyback:2400] Out: -   LAB RESULTS: Recent Labs    12/31/18 0650  WBC 13.8*  HGB 13.4  HCT 44.4  PLT 366   BMET Lab Results  Component Value Date   NA 143 12/31/2018   NA 137 11/24/2018   NA 137 11/19/2018   K 4.6 12/31/2018   K 4.6 11/24/2018   K 5.0 11/19/2018   CL 102 12/31/2018   CL 98 11/24/2018   CL 101 11/19/2018   CO2 30 12/31/2018   CO2 26 11/24/2018   CO2 27 11/19/2018   GLUCOSE 91 12/31/2018   GLUCOSE 91 11/24/2018   GLUCOSE 93 11/19/2018   BUN 54 (H) 12/31/2018   BUN 25 (H) 11/24/2018   BUN 28 (H) 11/19/2018   CREATININE 1.00 12/31/2018   CREATININE 1.08 11/24/2018   CREATININE 1.09 11/19/2018   CALCIUM 8.2 (L) 12/31/2018   CALCIUM 9.8 11/24/2018   CALCIUM 9.8 11/19/2018   LFT Recent Labs    12/31/18 0814  PROT 7.1  ALBUMIN 2.6*  AST 18  ALT 14  ALKPHOS 70  BILITOT  0.3   PT/INR Lab Results  Component Value Date   INR 1.03 12/17/2017   INR 1.17 03/30/2017   INR 0.99 01/02/2017   Hepatitis Panel No results for input(s): HEPBSAG, HCVAB, HEPAIGM, HEPBIGM in the last 72 hours. C-Diff No components found for: CDIFF Lipase     Component Value Date/Time   LIPASE 24 12/31/2018 0814    Drugs of Abuse     Component Value Date/Time   LABOPIA NONE DETECTED 10/19/2016 0050   COCAINSCRNUR NONE DETECTED 10/19/2016 0050   LABBENZ NONE DETECTED 10/19/2016 0050   AMPHETMU NONE DETECTED 10/19/2016 0050   THCU NONE DETECTED 10/19/2016 0050   LABBARB NONE DETECTED 10/19/2016 0050     RADIOLOGY STUDIES: Dg Abdomen Peg Tube Location  Result Date: 12/31/2018 CLINICAL DATA:  68 year old male with PEG tube.  Evaluate for patency. EXAM: ABDOMEN - 1 VIEW COMPARISON:  Abdominal radiograph dated 11/18/2018 FINDINGS: Contrast injected via the percutaneous gastrostomy is noted within the stomach and duodenal C-loop. No extraluminal contrast identified. There is large amount of stool throughout the colon. No bowel dilatation. There is degenerative changes of the spine. Midline surgical staples noted. IMPRESSION: Percutaneous gastrostomy within the distal stomach. No extraluminal contrast identified. Electronically Signed   By: Anner Crete M.D.   On: 12/31/2018 09:44   Dg Chest Portable 1 View  Result Date: 12/31/2018 CLINICAL DATA:  Increased congestion. EXAM: PORTABLE CHEST 1 VIEW COMPARISON:  11/24/2018. FINDINGS: Tracheostomy tube stable position. Heart size normal. Low lung volumes with mild bibasilar atelectasis/infiltrates. No pleural effusion or pneumothorax. Surgical clips left upper scratched it surgical clips left chest. 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 IMPRESSION: 1.  Tracheostomy tube stable position. 2.  Low lung volumes with mild bibasilar atelectasis/infiltrates. 3. Sclerotic changes left humeral head consistent with avascular necrosis. Electronically Signed   By: [place]   On: 12/31/2018 06:25     IMPRESSION:   *   Dark bloody oral and trach secretions.  Nonbloody, pale amber-colored G-tube secretions. Not clear that patient has active GI bleed but normally would expect to see some blood in the gastric secretions or melena in the stool with a BUN of 50.  *     Azotemia with normal creatinine.  *     Tracheostomy and gastrostomy tube dependent. Gastric tube replaced 11/19/2018.  No reports of issues with G-tube or tube feedings.  *     Sepsis criteria.  ? Aspiration PNA, though CXR not overwhelming for PNA.   Received Rocephin, Zithromax, clindamycin.  Zosyn to begin tonight.  *   Constipation in patient with of fecal impaction,  obstipation.  *      ?Chronic Plavix per prior hx.  However today's updated med list says the patient is not taking this medication.  PLAN:     *   Does not need Protonix drip.  Change the order to Protonix 40 mg IV twice daily.  *   Would obtain a clean-catch urine to rule out UTI.  However urine is going to be difficult to interpret in a patient with indwelling Foley catheter.  *     Plan EGD tomorrow.    *   COVID-19 test ordered, epic says this is in process.   *    Miralax, 1 dose q 4 hours x 3 via tube     Azucena Freed  12/31/2018, 3:13 PM Phone [phone]

## 2018-12-31 NOTE — Progress Notes (Signed)
Pt arrived to [facility] with foley from home. It looked to be old and possibly infected. RN paged MD Verlon Au for an order to exchange foley. RN exchanged foley at 1800. Obtained a urine sample from the new foley and sent down for a urinalysis.

## 2018-12-31 NOTE — ED Provider Notes (Signed)
Emergency Department Provider Note   I have reviewed the triage vital signs and the nursing notes.   HISTORY  Chief Complaint Hemoptysis (pt presents with trach and copious amounts of malodorus sputum (coffee ground/Bauza) pt seen here recently with similar CC. )   HPI Scott Rivera is a 68 y.o. male with a history of cerebellar infarct and tracheostomy on room air normally presents the emerge department today with coughing and abnormal discharge from his trach.  States he had a fever but not able to tell me how high it was.  States he has been coughing a lot recently.  Has a G-tube he states that is working fine.   No other associated or modifying symptoms.    Past Medical History:  Diagnosis Date  . Acute pulmonary edema (HCC)   . Anemia due to chronic kidney disease   . ARF (acute renal failure) ([place]) 10/19/2016  . Aspiration pneumonia ([place])   . Cerebellar infarct ([place]) 10/19/2016  . Chronic kidney disease, stage 3 (moderate) (HCC)   . CKD (chronic kidney disease) stage 2, GFR 60-89 ml/min 01/02/2017  . 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 Gastrostomy tube obstruction ([place])   . HCAP (healthcare-associated pneumonia)   . Helicobacter pylori infection 09/21/2012  . History of adenomatous polyp of colon 07/28/2015  . Hypertension   . MDR Acinetobacter baumannii infection   . Pneumothorax, closed, traumatic, initial encounter 12/10/2016  . Renal insufficiency   . Rhabdomyolysis 10/19/2016  . S/P percutaneous endoscopic gastrostomy (PEG) tube placement ([place]) 03/30/2017  . Stage III pressure ulcer of sacral region ([place]) 12/17/2016  . Stroke ([place])   . UTI (urinary tract infection) 03/30/2017    Patient Active Problem List   Diagnosis Date Noted  . Pneumoperitoneum 04/09/2018  . Perforated viscus 04/09/2018  . 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 Aspiration of gastric contents   . Malnutrition of moderate  degree 11/19/2017  . Aspiration pneumonia of right lower lobe ([place]) 11/16/2017  . Nausea and vomiting 11/16/2017  . Tracheostomy tube present ([place]) 09/25/2017  . Chronic respiratory insufficiency 07/24/2017  . Hematuria 06/26/2017  . Foley catheter in place on admission 03/30/2017  . Diabetes mellitus ([place]) 03/14/2017  . History of tracheostomy 03/14/2017  . Percutaneous endoscopic gastrostomy status ([place]) 03/14/2017  . Gastroesophageal reflux disease without esophagitis 03/14/2017  . Acute respiratory failure with hypoxia ([place]) 03/02/2017  . Loculated pleural effusion   . CKD (chronic kidney disease) stage 2, GFR 60-89 ml/min 01/02/2017  . Anemia due to chronic kidney disease 12/09/2016  . Compensated metabolic alkalosis [id number]  . Goals of care, counseling/discussion   . Palliative care by specialist   . Aspiration into airway   . Dysphagia, post-stroke   . Tachypnea   . Leukocytosis   . Acute blood loss anemia   . Pressure injury of skin 10/20/2016  . Essential hypertension 10/19/2016  . History of cerebral infarction 10/19/2016  . Bilateral chronic knee pain 01/07/2016  . Tobacco abuse 12/23/2014  . Mixed hyperlipidemia 07/28/2013    Past Surgical History:  Procedure Laterality Date  . IR GASTROSTOMY TUBE MOD SED  10/27/2016  . IR PATIENT EVAL TECH 0-60 MINS  01/05/2017  . IR PATIENT EVAL TECH 0-60 MINS  01/09/2017  . IR PATIENT EVAL TECH 0-60 MINS  03/03/2017  . IR PATIENT EVAL TECH 0-60 MINS  04/03/2017  . IR REPLACE G-TUBE SIMPLE WO FLUORO  11/20/2017  . IR  [place] GASTRO/COLONIC TUBE PERCUT W/FLUORO  07/23/2017  . KNEE SURGERY    . TRACHEOSTOMY TUBE PLACEMENT      Current Outpatient Rx  . Order #: [id number] Class: Historical Med  . Order #: [id number] Class: Print  . Order #: [id number] Class: No Print  . Order #: [id number] Class: No Print  . Order #: [id number] Class: Print  . Order #: [id number] Class: No Print  . Order #: [id number] Class: No Print  . Order #:  [id number] Class: Historical Med  . Order #: [id number] Class: Historical Med  . Order #: [id number] Class: Historical Med  . Order #: [id number] Class: No Print  . Order #: [id number] Class: Print  . Order #: [id number] Class: Historical Med  . Order #: [id number] Class: Historical Med  . Order #: [id number] Class: Print  . Order #: [id number] Class: Normal  . Order #: [id number] Class: Normal  . Order #: [id number] Class: Normal  . Order #: [id number] Class: No Print  . Order #: [id number] Class: No Print  . Order #: [id number] Class: Historical Med  . Order #: [id number] Class: Historical Med  . Order #: [id number] Class: No Print    Allergies Patient has no known allergies.  Family History  Problem Relation Age of Onset  . Diabetes Mellitus II Brother   . CAD Neg Hx   . Stroke Neg Hx     Social History Social History   Tobacco Use  . Smoking status: Former Research scientist (life sciences)  . Smokeless tobacco: Never Used  . Tobacco comment: Smoked heavily until the day of his stroke  Substance Use Topics  . Alcohol use: Not Currently    Comment: Drank heavily prior to stroke per sister  . Drug use: Not Currently    Review of Systems  All other systems negative except as documented in the HPI. All pertinent positives and negatives as reviewed in the HPI. ____________________________________________   PHYSICAL EXAM:  VITAL SIGNS: ED Triage Vitals [12/31/18 0535]  Enc Vitals Group     BP 115/76     Pulse Rate 85     Resp 16     Temp 99.2 F (37.3 C)     Temp Source Oral     SpO2 (!) 88 %    Constitutional: Alert and oriented. Well appearing and in no acute distress. Eyes: Conjunctivae are normal. PERRL. EOMI. Head: Atraumatic. Nose: No congestion/rhinnorhea. Mouth/Throat: Mucous membranes are moist.  Oropharynx non-erythematous. Neck: No stridor.  No meningeal signs.  Trach with malodorous charcoal colored sputum even after suctioning.  Cardiovascular: Normal rate, regular rhythm. Good peripheral  circulation. Grossly normal heart sounds.   Respiratory: Normal respiratory effort.  No retractions. Lungs rhoncorous and wheezing diffusely. . Gastrointestinal: Soft and nontender. No distention.  Musculoskeletal: No lower extremity tenderness nor edema. No gross deformities of extremities. Neurologic:  Normal speech and language. No gross focal neurologic deficits are appreciated.  Skin:  Skin is warm, dry and intact. No rash noted.  ____________________________________________   LABS (all labs ordered are listed, but only abnormal results are displayed)  Labs Reviewed  CBC WITH DIFFERENTIAL/PLATELET  COMPREHENSIVE METABOLIC PANEL  LIPASE, BLOOD  LACTIC ACID, PLASMA  LACTIC ACID, PLASMA  POCT GASTRIC OCCULT BLOOD (1-CARD TO LAB)   ____________________________________________  EKG   EKG Interpretation  Date/Time:    Ventricular Rate:    PR Interval:    QRS Duration:   QT Interval:    QTC Calculation:   R Axis:     Text Interpretation:         ____________________________________________  RADIOLOGY  No results found.  ____________________________________________  PROCEDURES  Procedure(s) performed:   Procedures   ____________________________________________   INITIAL IMPRESSION / ASSESSMENT AND PLAN / ED COURSE  Suspect aspiration as he has had problems before.  Will check G tube along with xr of chest. Gastric occult the material coming out.  He is hypoxic so will likely need admitted anyway.  Care transerred pending rest of workup.    Pertinent labs & imaging results that were available during my care of the patient were reviewed by me and considered in my medical decision making (see chart for details).  ____________________________________________  FINAL CLINICAL IMPRESSION(S) / ED DIAGNOSES  Final diagnoses:  None     MEDICATIONS GIVEN DURING THIS VISIT:  Medications - No data to display   NEW OUTPATIENT MEDICATIONS STARTED DURING  THIS VISIT:  New Prescriptions   No medications on file    Note:  This note was prepared with assistance of Dragon voice recognition software. Occasional wrong-word or sound-a-like substitutions may have occurred due to the inherent limitations of voice recognition software.   Sanaia Jasso, Corene Cornea, MD 12/31/18 [phone]

## 2018-12-31 NOTE — ED Notes (Signed)
Provider informed of lab result Lactic 2.8

## 2018-12-31 NOTE — Evaluation (Addendum)
Received order for swallow evaluation to commence tomorrow after 1444.  However from reading ordering MD notes, PMSV trials are desired.    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  Note pt with PEG tube and issues with red blood emesis from mouth and blood secretions from the trach PTA.  Hospitalist documented frequent ED visits x5 to [facility] for vomiting.    Per GI notes, plan is for endoscopy tomorrow 01/01/2019.      Per review of Epic chart, SLP saw pt in [facility] system for OP MBS 11/27/2016 with recommendation for NPO except therapeutic po of nectar liquids via cup hand over hand with SLP.    Most recent OP MBS 09/27/2017 showed immediate sensed aspiration of thin, likely penetration of nectar and severe pyriform sinus residuals as well as moderate vallecular residuals.  PMSV in place for duration of study.  Mild cough response with aspiration.  Recommended puree and tsps of nectar trials with SLP.   SLP will follow up.    Luanna Salk, [place] SLP Acute Rehab Services Pager [phone] Office [phone]

## 2018-12-31 NOTE — H&P (Addendum)
HPI  Scott Rivera [id number] DOB: 09/27/1950 DOA: 12/31/2018  PCP: [facility]   Chief Complaint: coughing up blood ? 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 trach PEG Chronic loculated pleural effusion pressure ulcers, prior fecal impaction  Last admitted 12/24 through 12/30 1 black vomit everywhere concern for perforated viscus he was felt to be very high risk candidate for surgery at the time-treated for right LL pneumonia hypoxic anemic respiratory failure and hypernatremia at the time as well  Sister gives me the entire story as patient is unable although he is more pleasant and conversant than he was earlier this morning Over the past several days since last week has been having increased secretions-obtained what sounds like scopolamine patch from PCP regarding secretions Secretions kept getting worse-tan consistency-started becoming darker in color-this morning at home patient's sisters are noted patient bent over and vomiting bright red blood through his mouth and through the trach This is the first episode he has done this--- however looking back through notes he has been to the ED [facility] at [facility] he was vomiting and it looks like he had been sent home-Per the nursing report at triage on 8/10 he has been having coffee-ground emesis   is on Plavix   Review of Systems:  Cannot obtain patient is nonverbal but does motion without words does not appear to use a Passy-Muir valve  Pertinent +'s: Pertinent -"s:  ED Course: Patient given azithromycin ceftriaxone clindamycin in ED given 2.25 L boluses of fluid in the emergency room lactic acid was obtained    Past Medical History:  Diagnosis Date  . Acute pulmonary edema (HCC)   . Anemia due to chronic kidney disease   . ARF (acute renal failure) ([place]) 10/19/2016  . Aspiration pneumonia ([place])   . Cerebellar infarct ([place]) 10/19/2016  . Chronic kidney disease, stage 3 (moderate)  (HCC)   . CKD (chronic kidney disease) stage 2, GFR 60-89 ml/min 01/02/2017  . 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 Gastrostomy tube obstruction ([place])   . HCAP (healthcare-associated pneumonia)   . Helicobacter pylori infection 09/21/2012  . History of adenomatous polyp of colon 07/28/2015  . Hypertension   . MDR Acinetobacter baumannii infection   . Pneumothorax, closed, traumatic, initial encounter 12/10/2016  . Renal insufficiency   . Rhabdomyolysis 10/19/2016  . S/P percutaneous endoscopic gastrostomy (PEG) tube placement ([place]) 03/30/2017  . Stage III pressure ulcer of sacral region ([place]) 12/17/2016  . Stroke ([place])   . UTI (urinary tract infection) 03/30/2017   Past Surgical History:  Procedure Laterality Date  . IR GASTROSTOMY TUBE MOD SED  10/27/2016  . IR PATIENT EVAL TECH 0-60 MINS  01/05/2017  . IR PATIENT EVAL TECH 0-60 MINS  01/09/2017  . IR PATIENT EVAL TECH 0-60 MINS  03/03/2017  . IR PATIENT EVAL TECH 0-60 MINS  04/03/2017  . IR REPLACE G-TUBE SIMPLE WO FLUORO  11/20/2017  . IR [place] TUBE PERCUT W/FLUORO  07/23/2017  . KNEE SURGERY    . TRACHEOSTOMY TUBE PLACEMENT      reports that he has quit smoking. He has never used smokeless tobacco. He reports previous alcohol use. He reports previous drug use. Is total care has significant home health at home who helps he lives with his sister who took him out of the nursing home recently Mobility: Gait  No Known Allergies Family History  Problem Relation Age of Onset  . Diabetes  Mellitus II Brother   . CAD Neg Hx   . Stroke Neg Hx    Prior to Admission medications   Medication Sig Start Date End Date Taking? Authorizing Provider  acetaminophen (TYLENOL) 500 MG tablet Place 1,000 mg into feeding tube 3 (three) times daily.    Yes [provider]  amLODipine (NORVASC) 10 MG tablet Place 1 tablet (10 mg total) into feeding tube daily. 11/23/17  Yes Kayleen Memos, DO  cloNIDine  (CATAPRES - DOSED IN MG/24 HR) 0.1 mg/24hr patch Place 1 patch (0.1 mg total) onto the skin every Tuesday. 04/16/18  Yes Aline August, MD  Ferrous Sulfate (IRON) 325 (65 Fe) MG TABS Place 325 mg into feeding tube daily.    Yes [provider]  gabapentin (NEURONTIN) 100 MG capsule 100 mg See admin instructions. Open 1 capsule (100 mg) and administer contents via feeding tube three times daily   Yes [provider]  glycopyrrolate (ROBINUL) 1 MG tablet Place 1 mg into feeding tube 2 (two) times daily.  11/18/18  Yes [provider]  metoprolol tartrate (LOPRESSOR) 50 MG tablet Take 1 tablet (50 mg total) by mouth 2 (two) times daily. 12/21/17  Yes Thurnell Lose, MD  mirtazapine (REMERON) 15 MG tablet Take 7.5 mg by mouth at bedtime. 12/24/18  Yes [provider]  montelukast (SINGULAIR) 10 MG tablet Place 10 mg into feeding tube daily.    Yes [provider]  ondansetron (ZOFRAN) 4 MG tablet Place 1 tablet (4 mg total) into feeding tube every 6 (six) hours as needed for nausea. 11/24/18  Yes Lucrezia Starch, MD  pravastatin (PRAVACHOL) 20 MG tablet Place 1 tablet (20 mg total) into feeding tube daily at 6 PM. Patient taking differently: Place 20 mg daily into feeding tube.  01/10/17  Yes Cherene Altes, MD  scopolamine (TRANSDERM-SCOP) 1 MG/3DAYS Place 1 mg onto the skin every 3 (three) days. 12/30/18  Yes [provider]  vitamin C (ASCORBIC ACID) 500 MG tablet Take 500 mg by mouth daily.   Yes [provider]  aspirin 81 MG chewable tablet Place 1 tablet (81 mg total) into feeding tube daily. Patient not taking: Reported on 11/19/2018 04/05/17   Nita Sells, MD  atropine 1 % ophthalmic solution Place 2 drops under the tongue 2 (two) times daily. Patient not taking: Reported on 11/19/2018 04/16/18   Aline August, MD  bisacodyl (DULCOLAX) 10 MG suppository Place 1 suppository (10 mg total) rectally daily. Patient not taking:  Reported on 11/19/2018 12/22/17   Thurnell Lose, MD  clopidogrel (PLAVIX) 75 MG tablet Place 1 tablet (75 mg total) into feeding tube daily. Patient not taking: Reported on 12/31/2018 01/11/17   Cherene Altes, MD  ipratropium-albuterol (DUONEB) 0.5-2.5 (3) MG/3ML SOLN Take 3 mLs by nebulization every 4 (four) hours as needed. Patient not taking: Reported on 11/19/2018 01/10/17   Cherene Altes, MD  Nutritional Supplements (FEEDING SUPPLEMENT, JEVITY 1.2 CAL,) LIQD Place 1,000 mLs into feeding tube continuous. Patient not taking: Reported on 11/19/2018 11/22/17   Kayleen Memos, DO  pantoprazole sodium (PROTONIX) 40 mg/20 mL PACK Place 20 mLs (40 mg total) into feeding tube 2 (two) times daily. Patient not taking: Reported on 11/19/2018 04/16/18   Aline August, MD  polyethylene glycol (MIRALAX) 17 g packet Take 17 g by mouth daily. Patient not taking: Reported on 12/31/2018 11/19/18   Dorie Rank, MD  thiamine 100 MG tablet Place 1 tablet (100 mg total)  into feeding tube daily. Patient not taking: Reported on 11/19/2018 01/11/17   Cherene Altes, MD  Water For Irrigation, Sterile (FREE WATER) SOLN Place 100 mLs into feeding tube every 3 (three) hours. Patient not taking: Reported on 11/19/2018 04/16/18   Aline August, MD    Physical Exam:  Vitals:   12/31/18 1300 12/31/18 1330  BP: (!) 143/69 (!) 143/81  Pulse: 87 87  Resp:  18  Temp:    SpO2: 95% 96%     Frail cachectic African-American male cataract lucencies present  External ocular movements intact follows commands has a tracheotomy with thick tan almost dark secretions  Very cachectic appearing  Chest is clear bilaterally and posterolaterally after turning him in the bed with help of RN staff  S1-S2 tachycardic  Neurologically is able to follow commands and is able to raise both legs off the bed he has what looks like an old fracture to his right shin and looks like he also has a chronically swollen left knee probably  osteoarthritic or prior fracture  He has a stage II sacral decubitus  He has a Foley catheter in place which was supposed to be changed today according to home health  Cannot assess psych he is cooperative however  I have personally reviewed following labs and imaging studies  Labs:   BUNs/creatinine up from baseline of 25/1 0.0-50 4/1.00 white count 13.8  Hemoglobin 13.4  Lactic acid 2.8  Lipase 24   Imaging studies:   PEG tube gastrostomy showed no extraluminal contrast as per study  1 view CXR showed stable trach tube with low lung volumes mild bibasilar atelectasis and 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 Zenia Resides  Decision to obtain old records:   Yes  Review and summation of old records:   Yes I have gone over briefly the h/o  Active Problems:   * No active hospital problems. *   Assessment/Plan ?  Upper GI bleed-is on Plavix 75, ASA 81 History-BUN/creatinine support UGI B-type screen-do not transfuse-GI to consult-Protonix GTT for now-going to stepdown VDR F in the past with chronic trach Possible aspiration of vomitus Possible aspiration-reasonable to cover with Zosyn at this time-Pro calcitonin/lactic to de-escalate-RT to see-last time he was here he was discharged on 10 L he is actually doing fairly well on 8 L Stop Jevity for now-only flush 100 cc H2O every 8 into tube until GI sees Passy-Muir trials SLP speech to see a.m. HTN Holding Norvasc, metoprolol-continue clonidine patch Increase secretions Possible pneumonia-can continue scop patch Sclerotic changes to L humerus--L humeral nead AVN OT to see for ROM exercises--doubt he is a candidate for major shoulder surgery Hyperlipidemia-hold Pravachol for now Poor appetite-hold Remeron for now Anemia-hemoglobin is fair however we  will hold iron for now Neuropathy-hold gabapentin 100 for now  Severity of Illness: The appropriate patient status for this patient is INPATIENT. Inpatient status is judged to be reasonable and necessary in order to provide the required intensity of service to ensure the patient's safety. The patient's presenting symptoms, physical exam findings, and initial radiographic and laboratory data in the context of their chronic comorbidities is felt to place them at high risk for further clinical deterioration. Furthermore, it is not anticipated that the patient will be medically stable for discharge from the hospital within 2 midnights of admission. The following factors  support the patient status of inpatient.   " The patient's presenting symptoms include probable upper GI bleed as well as pneumonia. " The worrisome physical exam findings include coughing up blood. " The initial radiographic and laboratory data are worrisome because of BUN/creatinine elevated. " The chronic co-morbidities include vent PEG trach.   * I certify that at the point of admission it is my clinical judgment that the patient will require inpatient hospital care spanning beyond 2 midnights from the point of admission due to high intensity of service, high risk for further deterioration and high frequency of surveillance required.*   DVT prophylaxis: SCDs Code Status: Full steroid status Family Communication: Talk to sister in detail Consults called: PCCM-await callback from GI  Time spent: 62 minutes  Verlon Au, MD [days-call my NP partners at night for Care related issues] [facility] --[employer] [employer] [place] , [email or web address]; password [facility]  12/31/2018, 2:09 PM

## 2018-12-31 NOTE — ED Notes (Signed)
?   g-tube malFX/hemoptysis(plavixx)/aspiration pna

## 2019-01-01 ENCOUNTER — Encounter (HOSPITAL_COMMUNITY)
Admission: EM | Disposition: A | Discharge status: Home / Self Care | Payer: Self-pay | Source: Home / Self Care | Attending: Family Medicine

## 2019-01-01 ENCOUNTER — Inpatient Hospital Stay (HOSPITAL_COMMUNITY): Payer: Medicare HMO | Admitting: Anesthesiology

## 2019-01-01 ENCOUNTER — Encounter (HOSPITAL_COMMUNITY): Payer: Self-pay | Admitting: Emergency Medicine

## 2019-01-01 ENCOUNTER — Inpatient Hospital Stay (HOSPITAL_COMMUNITY): Payer: Medicare HMO

## 2019-01-01 DIAGNOSIS — Z930 Tracheostomy status: Secondary | ICD-10-CM

## 2019-01-01 DIAGNOSIS — K920 Hematemesis: Secondary | ICD-10-CM

## 2019-01-01 DIAGNOSIS — J690 Pneumonitis due to inhalation of food and vomit: Secondary | ICD-10-CM

## 2019-01-01 DIAGNOSIS — K269 Duodenal ulcer, unspecified as acute or chronic, without hemorrhage or perforation: Secondary | ICD-10-CM

## 2019-01-01 DIAGNOSIS — K209 Esophagitis, unspecified: Secondary | ICD-10-CM

## 2019-01-01 HISTORY — PX: BIOPSY: SHX5522

## 2019-01-01 HISTORY — PX: ESOPHAGOGASTRODUODENOSCOPY (EGD) WITH PROPOFOL: SHX5813

## 2019-01-01 LAB — COMPREHENSIVE METABOLIC PANEL
ALT: 16 U/L (ref 0–44)
AST: 20 U/L (ref 15–41)
Albumin: 3.3 g/dL — ABNORMAL LOW (ref 3.5–5.0)
Alkaline Phosphatase: 73 U/L (ref 38–126)
Anion gap: 10 (ref 5–15)
BUN: 38 mg/dL — ABNORMAL HIGH (ref 8–23)
CO2: 27 mmol/L (ref 22–32)
Calcium: 9.3 mg/dL (ref 8.9–10.3)
Chloride: 106 mmol/L (ref 98–111)
Creatinine, Ser: 0.98 mg/dL (ref 0.61–1.24)
GFR calc Af Amer: >60 mL/min (ref >60)
GFR calc non Af Amer: >60 mL/min (ref >60)
Glucose, Bld: 104 mg/dL — ABNORMAL HIGH (ref 70–99)
Potassium: 4.8 mmol/L (ref 3.5–5.1)
Sodium: 143 mmol/L (ref 135–145)
Total Bilirubin: 0.4 mg/dL (ref 0.3–1.2)
Total Protein: 7.4 g/dL (ref 6.5–8.1)

## 2019-01-01 LAB — CBC
HCT: 41.2 % (ref 39.0–52.0)
Hemoglobin: 12.1 g/dL — ABNORMAL LOW (ref 13.0–17.0)
MCH: 29.7 pg (ref 26.0–34.0)
MCHC: 29.4 g/dL — ABNORMAL LOW (ref 30.0–36.0)
MCV: 101 fL — ABNORMAL HIGH (ref 80.0–100.0)
Platelets: 243 10*3/uL (ref 150–400)
RBC: 4.08 MIL/uL — ABNORMAL LOW (ref 4.22–5.81)
RDW: 14.2 % (ref 11.5–15.5)
WBC: 12.9 10*3/uL — ABNORMAL HIGH (ref 4.0–10.5)
nRBC: 0 % (ref 0.0–0.2)

## 2019-01-01 LAB — PROTIME-INR
INR: 1 (ref 0.8–1.2)
Prothrombin Time: 13.1 seconds (ref 11.4–15.2)

## 2019-01-01 LAB — HIV ANTIBODY (ROUTINE TESTING W REFLEX): HIV Screen 4th Generation wRfx: NONREACTIVE

## 2019-01-01 LAB — PROCALCITONIN: Procalcitonin: 0.36 ng/mL

## 2019-01-01 SURGERY — ESOPHAGOGASTRODUODENOSCOPY (EGD) WITH PROPOFOL
Anesthesia: Monitor Anesthesia Care

## 2019-01-01 MED ORDER — PHENYLEPHRINE 40 MCG/ML (10ML) SYRINGE FOR IV PUSH (FOR BLOOD PRESSURE SUPPORT)
PREFILLED_SYRINGE | INTRAVENOUS | Status: DC | PRN
  Administered 2019-01-01 (×2): 80 ug via INTRAVENOUS

## 2019-01-01 MED ORDER — PROPOFOL 10 MG/ML IV BOLUS
INTRAVENOUS | Status: DC | PRN
  Administered 2019-01-01 (×2): 20 mg via INTRAVENOUS

## 2019-01-01 MED ORDER — LIDOCAINE 2% (20 MG/ML) 5 ML SYRINGE
INTRAMUSCULAR | Status: DC | PRN
  Administered 2019-01-01: 80 mg via INTRAVENOUS

## 2019-01-01 MED ORDER — PROPOFOL 500 MG/50ML IV EMUL
INTRAVENOUS | Status: DC | PRN
  Administered 2019-01-01: 125 ug/kg/min via INTRAVENOUS

## 2019-01-01 MED ORDER — PROPOFOL 10 MG/ML IV BOLUS
INTRAVENOUS | Status: AC
  Filled 2019-01-01: qty 40

## 2019-01-01 MED ORDER — LACTATED RINGERS IV SOLN
INTRAVENOUS | Status: DC | PRN
  Administered 2019-01-01: 14:00:00 via INTRAVENOUS

## 2019-01-01 SURGICAL SUPPLY — 15 items

## 2019-01-01 NOTE — Progress Notes (Signed)
PROGRESS NOTE    Scott Rivera  [id number] DOB: 1950-07-15 DOA: 12/31/2018 PCP: [facility]   Brief Narrative: Scott Rivera is a 68 y.o. male with a history of hypertension, stroke s/p PEG and trach, chronic pleural effusion. Patient presented secondary to recurrent hematemesis. He was also found to have evidence of possible aspiration pneumonia. GI and PCCM consulted. EGD today.   Assessment & Plan:   Active Problems:   GI bleed   Aspiration pneumonia (HCC)   Upper GI bleeding Recurrent issue of hemoptysis. GI consulted with plans for EGD today. Plavix held. -Continue PPI -GI recommendations  Concern for aspiration pneumonia Sputum culture pending. Started on Zosyn. Leukocytosis improving. Afebrile. Procalcitonin down from admission. -Continue Zosyn -Wait for culture data  Chronic respiratory failure S/p tracheostomy. PCCM consulted. Recommendations for cuffless trach on discharge.  Right humeral head osteonecrosis Outpatient follow-up  Pressure injury Sacrum, POA   DVT prophylaxis: SCDs Code Status:   Code Status: Full Code Family Communication: Attempted to call sister using three numbers Disposition Plan: Discharge home likely in 24 hours pending culture data and GI recommendations   Consultants:    Gastroenterology  Procedures:   9/16: EGD  Antimicrobials:  Zosyn    Subjective: Patient cannot communicate well secondary to trach  Objective: Vitals:   01/01/19 0310 01/01/19 0400 01/01/19 0500 01/01/19 0800  BP:  (!) 128/43 134/62   Pulse:  76 74   Resp:  14 14   Temp: 97.8 F (36.6 C)   97.7 F (36.5 C)  TempSrc: Oral   Axillary  SpO2:  99% 98%   Weight:      Height:        Intake/Output Summary (Last 24 hours) at 01/01/2019 0849 Last data filed at 01/01/2019 0600 Gross per 24 hour  Intake 4271.71 ml  Output 1950 ml  Net 2321.71 ml   Filed Weights   12/31/18 1734  Weight: 77.9 kg    Examination:   General exam: Appears calm and comfortable HEENT: poor dentition Respiratory system: Rhonchi bilaterally. Respiratory effort normal. Cardiovascular system: S1 & S2 heard, RRR. No murmurs, rubs, gallops or clicks. Gastrointestinal system: Abdomen is mildly distended, soft and nontender. No organomegaly or masses felt. Normal bowel sounds heard. Central nervous system: Alert. Extremities: No edema. No calf tenderness. Bilateral hand contractures Skin: No cyanosis. No rashes Psychiatry: Judgement and insight appear normal. Mood & affect appropriate.     Data Reviewed: I have personally reviewed following labs and imaging studies  CBC: Recent Labs  [facility]iver[facility] 12/31/18 0650 12/31/18 1722 01/01/19 0214  WBC 13.8* 16.7* 12.9*  NEUTROABS 10.1* 12.6*  --   HGB 13.4 11.4* 12.1*  HCT 44.4 38.6* 41.2  MCV 98.0 101.3* 101.0*  PLT 366 306 0000000   Basic Metabolic Panel: Recent Labs  Rivera 12/31/18 0814 01/01/19 0214  NA 143 143  K 4.6 4.8  CL 102 106  CO2 30 27  GLUCOSE 91 104*  BUN 54* 38*  CREATININE 1.00 0.98  CALCIUM 8.2* 9.3   GFR: Estimated Creatinine Clearance: 70.8 mL/min (by C-G formula based on SCr of 0.98 mg/dL). Liver Function Tests: Recent Labs  Rivera 12/31/18 0814 01/01/19 0214  AST 18 20  ALT 14 16  ALKPHOS 70 73  BILITOT 0.3 0.4  PROT 7.1 7.4  ALBUMIN 2.6* 3.3*   Recent Labs  Rivera 12/31/18 0814  LIPASE 24   No results for input(s): AMMONIA in the last 168 hours. Coagulation Profile: Recent Labs  Rivera 01/01/19  0214  INR 1.0   Cardiac Enzymes: No results for input(s): CKTOTAL, CKMB, CKMBINDEX, TROPONINI in the last 168 hours. BNP (last 3 results) No results for input(s): PROBNP in the last 8760 hours. HbA1C: No results for input(s): HGBA1C in the last 72 hours. CBG: No results for input(s): GLUCAP in the last 168 hours. Lipid Profile: No results for input(s): CHOL, HDL, LDLCALC, TRIG, CHOLHDL, LDLDIRECT in the last 72 hours. Thyroid Function Tests: No  results for input(s): TSH, T4TOTAL, FREET4, T3FREE, THYROIDAB in the last 72 hours. Anemia Panel: No results for input(s): VITAMINB12, FOLATE, FERRITIN, TIBC, IRON, RETICCTPCT in the last 72 hours.   Sepsis Labs: Recent Labs  Rivera 12/31/18 0804 12/31/18 1722 01/01/19 0214  PROCALCITON  --  0.42 0.36  LATICACIDVEN 2.8*  --   --     Recent Results (from the past 240 hour(s))  SARS CORONAVIRUS 2 (TAT 6-24 HRS) Nasopharyngeal Nasopharyngeal Swab     Status: None   Collection Time: 12/31/18  8:19 AM   Specimen: Nasopharyngeal Swab  Result Value Ref Range Status   SARS Coronavirus 2 NEGATIVE NEGATIVE Final    Comment: (NOTE) SARS-CoV-2 target nucleic acids are NOT DETECTED. The SARS-CoV-2 RNA is generally detectable in upper and lower respiratory specimens during the acute phase of infection. Negative results do not preclude SARS-CoV-2 infection, do not rule out co-infections with other pathogens, and should not be used as the sole basis for treatment or other patient management decisions. Negative results must be combined with clinical observations, patient history, and epidemiological information. The expected result is Negative. Fact Sheet for Patients: SugarRoll.be Fact Sheet for Healthcare Providers: https://www.woods-mathews.com/ This test is not yet approved or cleared by the [place] FDA and  has been authorized for detection and/or diagnosis of SARS-CoV-2 by FDA under an Emergency Use Authorization (EUA). This EUA will remain  in effect (meaning this test can be used) for the duration of the COVID-19 declaration under Section 56 4(b)(1) of the Act, 21 U.S.C. section 360bbb-3(b)(1), unless the authorization is terminated or revoked sooner. Performed at [facility], [place] [address], [place], [place] [address]   MRSA PCR Screening     Status: None   Collection Time: 12/31/18  4:54 PM   Specimen: Nasopharyngeal  Result  Value Ref Range Status   MRSA by PCR NEGATIVE NEGATIVE Final    Comment:        The GeneXpert MRSA Assay (FDA approved for NASAL specimens only), is one component of a comprehensive MRSA colonization surveillance program. It is not intended to diagnose MRSA infection nor to guide or monitor treatment for MRSA infections. Performed at [facility], [place] [address], [place], [place] [address]   Culture, sputum-assessment     Status: None   Collection Time: 12/31/18  5:56 PM   Specimen: Sputum  Result Value Ref Range Status   Specimen Description SPU  Final   Special Requests NONE  Final   Sputum evaluation   Final    THIS SPECIMEN IS ACCEPTABLE FOR SPUTUM CULTURE Performed at [facility], [place] [address], [place], [place] [address]    Report Status 12/31/2018 FINAL  Final  Culture, respiratory     Status: None (Preliminary result)   Collection Time: 12/31/18  5:56 PM   Specimen: Sputum  Result Value Ref Range Status   Specimen Description   Final    SPU Performed at [place] [address], [place],  [address]  Special Requests   Final    NONE Reflexed from [id number] Performed at [facility], [place] [address] [place], [place] [address]    Gram Stain   Final    FEW WBC PRESENT, PREDOMINANTLY PMN FEW GRAM POSITIVE COCCI IN PAIRS RARE GRAM NEGATIVE RODS RARE GRAM POSITIVE RODS Performed at [facility], [place] [address] [place], [place] [address]    Culture PENDING  Incomplete   Report Status PENDING  Incomplete         Radiology Studies: Dg Abdomen Peg Tube Location  Result Date: 12/31/2018 CLINICAL DATA:  68 year old male with PEG tube. Evaluate for patency. EXAM: ABDOMEN - 1 VIEW COMPARISON:  Abdominal radiograph dated 11/18/2018 FINDINGS: Contrast injected via the percutaneous gastrostomy is noted within the stomach and duodenal C-loop. No extraluminal contrast identified.  There is large amount of stool throughout the colon. No bowel dilatation. There is degenerative changes of the spine. Midline surgical staples noted. IMPRESSION: Percutaneous gastrostomy within the distal stomach. No extraluminal contrast identified. Electronically Signed   By: Anner Crete M.D.   On: 12/31/2018 09:44   Dg Chest Port 1 View  Result Date: 01/01/2019 CLINICAL DATA:  Pneumonia. EXAM: PORTABLE CHEST 1 VIEW COMPARISON:  Chest x-ray from yesterday. FINDINGS: Unchanged tracheostomy tube with tip at the thoracic inlet. The heart size and mediastinal contours are within normal limits. New mild pulmonary vascular congestion. Unchanged opacity at the medial right lung base. No pleural effusion or pneumothorax. No acute osseous abnormality. 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 IMPRESSION: 1. New mild pulmonary vascular congestion. 2. Unchanged right medial basilar atelectasis versus infiltrate. Electronically Signed   By: Titus Dubin M.D.   On: 01/01/2019 08:20   Dg Chest Portable 1 View  Result Date: 12/31/2018 CLINICAL DATA:  Increased congestion. EXAM: PORTABLE CHEST 1 VIEW COMPARISON:  11/24/2018. FINDINGS: Tracheostomy tube stable position. Heart size normal. Low lung volumes with mild bibasilar atelectasis/infiltrates. No pleural effusion or pneumothorax. Surgical clips left upper scratched it surgical clips left chest. 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 IMPRESSION: 1.  Tracheostomy tube stable position. 2.  Low lung volumes with mild bibasilar atelectasis/infiltrates. 3. Sclerotic changes left humeral head consistent with avascular necrosis. Electronically Signed   By: Marcello Moores  Register   On: 12/31/2018 06:25        Scheduled Meds: . chlorhexidine  15 mL Mouth Rinse BID  . chlorhexidine  15 mL Mouth Rinse BID  . Chlorhexidine Gluconate Cloth  6 each Topical Daily  . cloNIDine  0.1 mg Transdermal Q Tue  . free water  100 mL  Per Tube Q3H  . influenza vaccine adjuvanted  0.5 mL Intramuscular Tomorrow-1000  . mouth rinse  15 mL Mouth Rinse q12n4p  . mouth rinse  15 mL Mouth Rinse q12n4p  . pantoprazole (PROTONIX) IV  40 mg Intravenous Q12H  . scopolamine  1.5 mg Transdermal Q72H   Continuous Infusions: . dextrose 5 % and 0.45% NaCl 100 mL/hr at 01/01/19 0410  . piperacillin-tazobactam (ZOSYN)  IV 3.375 g (01/01/19 [id number])     LOS: 1 day     Cordelia Poche, MD [facility] 01/01/2019, 8:49 AM  If 7PM-7AM, please contact night-coverage [email or web address]

## 2019-01-01 NOTE — Op Note (Signed)
[facility] Patient Name: Scott Rivera Procedure Date: 01/01/2019 MRN: [id number] Attending MD: Justice Britain , MD Date of Birth: 1951/03/25 CSN: [id number] Age: 68 Admit Type: Inpatient Procedure:                Upper GI endoscopy Indications:              Hematemesis, Recent gastrointestinal bleeding,                            Nausea with vomiting, History of PEG tube Providers:                Justice Britain, MD, Cleda Daub, RN, Marguerita Merles, Technician Referring MD:             Mariel Aloe Medicines:                Monitored Anesthesia Care Complications:            No immediate complications. Estimated Blood Loss:     Estimated blood loss was minimal. Procedure:                Pre-Anesthesia Assessment:                           - Prior to the procedure, a History and Physical                            was performed, and patient medications and                            allergies were reviewed. The patient's tolerance of                            previous anesthesia was also reviewed. The risks                            and benefits of the procedure and the sedation                            options and risks were discussed with the patient.                            All questions were answered, and informed consent                            was obtained. Prior Anticoagulants: The patient has                            taken Plavix (clopidogrel), last dose was 1 day                            prior to procedure. ASA Grade Assessment: III - A  patient with severe systemic disease. After                            reviewing the risks and benefits, the patient was                            deemed in satisfactory condition to undergo the                            procedure.                           After obtaining informed consent, the endoscope was                            passed  under direct vision. Throughout the                            procedure, the patient's blood pressure, pulse, and                            oxygen saturations were monitored continuously. The                            GIF-H190 ([id number]) Olympus gastroscope was                            introduced through the mouth, and advanced to the                            second part of duodenum. The upper GI endoscopy was                            accomplished without difficulty. The patient                            tolerated the procedure. Scope In: Scope Out: Findings:      LA Grade D (one or more mucosal breaks involving at least 75% of       esophageal circumference) esophagitis with no bleeding was found 22 to       36 cm from the incisors. This was biopsied with a cold forceps for       histology.      A small hiatal hernia was present.      Multiple dispersed, small non-bleeding erosions were found in the       gastric body and in the gastric antrum. There were no stigmata of recent       bleeding.      Striped moderately erythematous mucosa without bleeding was found in the       gastric body.      There was evidence of an intact gastrostomy with a patent G-tube present       in the gastric antrum. This was characterized by ulceration underneath.      Multiple dispersed erosions without bleeding were found in the duodenal       bulb, in the first portion  of the duodenum and in the second portion of       the duodenum.      The major papilla was normal.      Mucosal changes characterized by polypoid appearance were found in the       second portion of the duodenum distal to the ampulla. Biopsies were       taken with a cold forceps for histology to rule out adenoma. Impression:               - LA Grade D esophagitis. Biopsied.                           - Small hiatal hernia.                           - Non-bleeding erosive gastropathy. Erythematous                             mucosa in the gastric body.                           - Intact gastrostomy with a patent G-tube present                            characterized by ulceration.                           - Duodenal erosions without bleeding.                           - Normal major papilla.                           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 Biopsied. Moderate Sedation:      Not Applicable - Patient had care per Anesthesia. Recommendation:           - The patient will be observed post-procedure,                            until all discharge criteria are met.                           - Return patient to hospital ward for ongoing care.                           - Continue IV PPI BID x 24 hours and then                            transition to PGT 40 mg PPI BID thereafter.                           - Send H. pylori serology and treat if positive.                           -  Await pathology results.                           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 Patient                            would need EMR for this lesion and as he had not                            been off Plavix for a full 5-days prior to                            procedure and without discussion being had decision                            made to only biopsy. He can follow up based on                            biopsy results with his [facility] GI providers as                            previously established or with our group if desired.                           - Observe patient's clinical course.                           - OK to restart feeding (I believe per G tube                            feeding).                           - Plavix if possible to hold for 48 hours to                            decrease risk of bleeding post-biopsy would be                            ideal.                           - I tried to call the patient's sister on  multiple                            attempts but was not successful and will try to                            reach out to her again later if possible (unable to                            leave voicemails).                           -  The findings and recommendations were discussed                            with the patient.                           - The findings and recommendations were discussed                            with the referring physician. Procedure Code(s):        --- Professional ---                           [phone], Esophagogastroduodenoscopy, flexible,                            transoral; with biopsy, single or multiple Diagnosis Code(s):        --- Professional ---                           K20.9, Esophagitis, unspecified                           K44.9, Diaphragmatic hernia without obstruction or                            gangrene                           K31.89, Other diseases of stomach and duodenum                           Z93.1, Gastrostomy status                           K26.9, Duodenal ulcer, unspecified as acute or                            chronic, without hemorrhage or perforation                           K92.0, Hematemesis                           K92.2, Gastrointestinal hemorrhage, unspecified                           R11.2, Nausea with vomiting, unspecified CPT copyright 2019 American Medical Association. All rights reserved. The codes documented in this report are preliminary and upon coder review may  be revised to meet current compliance requirements. Justice Britain, MD 01/01/2019 3:56:36 PM Number of Addenda: 0

## 2019-01-01 NOTE — Progress Notes (Signed)
Initial Nutrition Assessment  RD working remotely.   DOCUMENTATION CODES:   Not applicable  INTERVENTION:  - when TF able to be re-started via PEG, recommend: Jevity 1.2 @ 35 ml/hr to advanced by 10 ml every 8 hours to reach goal rate of 65 ml/hr with 30 ml prostat once/day. - at goal rate, this regimen will provide 1972 kcal, 102 grams protein, and 1279 ml free water.    NUTRITION DIAGNOSIS:   Inadequate oral intake related to inability to eat as evidenced by NPO status.  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 He presented to the ED on 9/15 after several days of increased secretions that were tan-colored but progressively darkening. On 9/15 he was seen vomiting bright red blood through mouth and trach. Patient does not appear to use a PMV so he is essentially non-verbal, but does mouth words and motion.  Patient has been NPO since admission and is NPO at baseline; uses TF via PEG for nutrition. Review of chart indicates that patient is current in Diagnostic Radiology. Per chart review, current weight is 172 lb and weight is the highest it's been in >1.5 years. Patient was last seen by a [place] RD on 04/14/18. Patient was on Jevity 1.2 at that time.   Per notes: - SLP consulted--note 9/15 indicates for PMV trials - aspiration PNA - sepsis, leukocytosis - mild lactic acidosis - r/o UGIB - probable UTI - patient is bed-bound at baseline   Labs reviewed; BUN: 38 mg/dl. Medications reviewed; 1 packet miralax x3 doses 9/15, 40 mg IV protonix BID. IVF; D5-1/2 NS @ 100 ml/hr (408 kcal).     NUTRITION - FOCUSED PHYSICAL EXAM:  unable  to complete at this time.   Diet Order:   Diet Order            Diet NPO time specified Except for: Sips with Meds, Other (See Comments)  Diet effective now              EDUCATION NEEDS:   No education needs have been identified at this time  Skin:  Skin Assessment: Reviewed RN Assessment  Last BM:  9/16  Height:   Ht Readings from Last 1 Encounters:  12/31/18 5\' 6"  (1.676 m)    Weight:   Wt Readings from Last 1 Encounters:  12/31/18 77.9 kg    Ideal Body Weight:  64.5 kg  BMI:  Body mass index is 27.72 kg/m.  Estimated Nutritional Needs:   Kcal:  1945-2180 kcal  Protein:  95-105 grams  Fluid:  >/= 2 L/day      Jarome Matin, MS, RD, LDN, [facility] Inpatient Clinical Dietitian Pager # [phone] After hours/weekend pager # [phone]

## 2019-01-01 NOTE — Progress Notes (Signed)
Two rt,s changed out trach to a #6 cuffed Shiley for pt to go to ENDO for procedure. Pt had a #6 shiley in but the balloon was cut. When pt comes back we will place a #6 uncuffed in. Pt a color change on excpap, equal bs and vitals stable. Pt on 35% TC sats 100%.

## 2019-01-01 NOTE — Progress Notes (Signed)
Pt back from ENDO. To rt,s changed trach out to #6 uncuffed. Pt had good color change on EZCAP, equal bs and vitals stable.

## 2019-01-01 NOTE — Anesthesia Preprocedure Evaluation (Addendum)
Anesthesia Evaluation  Patient identified by MRN, date of birth, ID band Patient awake    Reviewed: Allergy & Precautions, NPO status , Patient's Chart, lab work & pertinent test results  Airway Mallampati: Trach  TM Distance: >3 FB     Dental  (+) Dental Advisory Given   Pulmonary pneumonia, former smoker,    breath sounds clear to auscultation       Cardiovascular hypertension,  Rhythm:Regular Rate:Normal     Neuro/Psych CVA    GI/Hepatic Neg liver ROS, GERD  ,  Endo/Other  diabetes  Renal/GU CRFRenal disease     Musculoskeletal   Abdominal   Peds  Hematology  (+) anemia ,   Anesthesia Other Findings   Reproductive/Obstetrics                            Anesthesia Physical Anesthesia Plan  ASA: III  Anesthesia Plan: MAC   Post-op Pain Management:    Induction: Intravenous  PONV Risk Score and Plan: 1 and Propofol infusion, Ondansetron and Treatment may vary due to age or medical condition  Airway Management Planned: Tracheostomy  Additional Equipment:   Intra-op Plan:   Post-operative Plan:   Informed Consent: I have reviewed the patients History and Physical, chart, labs and discussed the procedure including the risks, benefits and alternatives for the proposed anesthesia with the patient or authorized representative who has indicated his/her understanding and acceptance.       Plan Discussed with: CRNA  Anesthesia Plan Comments:         Anesthesia Quick Evaluation

## 2019-01-01 NOTE — Transfer of Care (Signed)
Immediate Anesthesia Transfer of Care Note  Patient: Scott Rivera  Procedure(s) Performed: ESOPHAGOGASTRODUODENOSCOPY (EGD) WITH PROPOFOL (N/A ) BIOPSY  Patient Location: PACU  Anesthesia Type:MAC  Level of Consciousness: awake, alert  and oriented  Airway & Oxygen Therapy: Patient Spontanous Breathing and Patient connected to tracheostomy mask oxygen  Post-op Assessment: Report given to RN and Post -op Vital signs reviewed and stable  Post vital signs: Reviewed and stable  Last Vitals:  Vitals Value Taken Time  BP    Temp    Pulse 75 01/01/19 1442  Resp 12 01/01/19 1442  SpO2 100 % 01/01/19 1442  Vitals shown include unvalidated device data.  Last Pain:  Vitals:   01/01/19 1326  TempSrc: Oral  PainSc: 0-No pain         Complications: No apparent anesthesia complications

## 2019-01-01 NOTE — Interval H&P Note (Signed)
History and Physical Interval Note:  01/01/2019 1:56 PM  Scott Rivera  has presented today for surgery, with the diagnosis of Bloody oral and tracheostomy secretions.  Elevated BUN.  The various methods of treatment have been discussed with the patient and family. After consideration of risks, benefits and other options for treatment, the patient has consented to  Procedure(s): ESOPHAGOGASTRODUODENOSCOPY (EGD) WITH PROPOFOL (N/A) as a surgical intervention.  The patient's history has been reviewed, patient examined, no change in status, stable for surgery.  I have reviewed the patient's chart and labs.  Questions were answered to the patient's satisfaction.     [employer]

## 2019-01-01 NOTE — Progress Notes (Signed)
Pt scheduled for EGD today, consent had not been signed. This RN called [name], pt sister, to get verbal consent since pt is nonverbal and cannot write. [name] agreed to the procedure and understands risks and benefits. Gloris Ham, RN double verified over the phone with pt sister. Will place consent in chart.

## 2019-01-01 NOTE — Progress Notes (Signed)
   NAME:  Scott Rivera, MRN:  [id number], DOB:  May 25, 1950, LOS: 1 ADMISSION DATE:  12/31/2018, CONSULTATION DATE: 9/15 REFERRING MD:  Verlon Au, CHIEF COMPLAINT:  Bloody trach secretions    Brief History   68 year old trach/PEG dependent male admitted w/ working dx of aspiration PNA but had bloody secretions from and around the trach leading to pulm consult    Past Medical History  HTN, prior acute CVA, HL, dysphagia w/ PEG, trach dependent s/p stroke, stage II CKD  Significant Hospital Events   9/15 admitted   Consults:  pulm   Procedures:    Significant Diagnostic Tests:  Gastric occult blood 9/15>>>  Micro Data:  Respiratory culture 9/15>>> COVID 19 9/15>>> Antimicrobials:   Zosyn 9/15>>> Interim history/subjective:    Objective   Blood pressure 134/62, pulse 74, temperature 97.7 F (36.5 C), temperature source Axillary, resp. rate 14, height 5\' 6"  (1.676 m), weight 77.9 kg, SpO2 98 %.    FiO2 (%):  [35 %] 35 %   Intake/Output Summary (Last 24 hours) at 01/01/2019 0854 Last data filed at 01/01/2019 0600 Gross per 24 hour  Intake 4271.71 ml  Output 1950 ml  Net 2321.71 ml   Filed Weights   12/31/18 1734  Weight: 77.9 kg    Examination: This is a chronically ill-appearing black male he is currently in no distress communicative in the bed following commands and interactive HEENT poor dentition tracheostomy size 6 cuffed with pilot balloon which is been cut.  Still has copious tracheal secretions however this seems improved comparing to yesterday Pulmonary diminished, scattered rhonchi at times these improved with cough no accessory use Cardiac: Regular rate and rhythm Abdomen: PEG unremarkable GU: Cloudy yellow urine which is improving Neuro: Awake, interactive, tries to communicate. Extremities: Moves upper extremities.  Contracted hands. Resolved Hospital Problem list    Assessment & Plan:   Chronic trach dependence  Aspiration Pneumonia  Leukocytosis   Sepsis Mild lactic acidosis  Vomiting  R/o UGIB Prior CVA PEG dependent Aphonia  Bed bound Probable UTI   Discussion Having recurrent episodes of vomiting. Has been evaluated at [facility] for this. In past has been heme-neg but does appear coffee gd/charcoal colored.   Happy to see is been seen by gastroenterology agree needs further evaluation of his nausea and vomiting.  Chest x-ray personally reviewed today does demonstrate slight worsening of flank space disease this was personally reviewed.  Not requiring much more in the way of oxygen.   Plan Cont IV zosyn F/u sputum and narrow as indicated I have instructed respiratory therapy to change trach, currently has cuffed trach but pilot balloon has been cut.  We will replace this so that if he needs general anesthesia or heavy sedation during endoscopy he can safely receive it, prior to discharge he needs to be changed back to cuffless trach and will need supplies for this. I am happy to see him in the trach clinic after discharge if family desires Continue PPI N.p.o. Additional recommendations per the internal medicine gastroenterology teams  Critical care will sign off, please call if we can be of assistance in the future  Critical care time: NA    Erick Colace ACNP-BC [place] Pager # [phone] OR # [phone] if no answer

## 2019-01-02 LAB — GLUCOSE, CAPILLARY: Glucose-Capillary: 78 mg/dL (ref 70–99)

## 2019-01-02 LAB — CBC
HCT: 35.5 % — ABNORMAL LOW (ref 39.0–52.0)
Hemoglobin: 10.8 g/dL — ABNORMAL LOW (ref 13.0–17.0)
MCH: 29.3 pg (ref 26.0–34.0)
MCHC: 30.4 g/dL (ref 30.0–36.0)
MCV: 96.5 fL (ref 80.0–100.0)
Platelets: 187 10*3/uL (ref 150–400)
RBC: 3.68 MIL/uL — ABNORMAL LOW (ref 4.22–5.81)
RDW: 14.1 % (ref 11.5–15.5)
WBC: 10.3 10*3/uL (ref 4.0–10.5)
nRBC: 0 % (ref 0.0–0.2)

## 2019-01-02 LAB — SURGICAL PATHOLOGY

## 2019-01-02 LAB — PROCALCITONIN: Procalcitonin: 0.2 ng/mL

## 2019-01-02 MED ORDER — PANTOPRAZOLE SODIUM 40 MG PO PACK: 40.0000 mg | PACK | Freq: Two times a day (BID) | ORAL | 0 refills | Status: DC

## 2019-01-02 MED ORDER — LEVOFLOXACIN 500 MG PO TABS: 500.0000 mg | ORAL_TABLET | Freq: Every day | ORAL | 0 refills | Status: AC

## 2019-01-02 MED ORDER — JEVITY 1.2 CAL PO LIQD: ORAL | 0 refills | Status: DC

## 2019-01-02 MED ORDER — PRO-STAT SUGAR FREE PO LIQD
30.0000 mL | Freq: Every day | ORAL | Status: DC
  Administered 2019-01-02: 12:00:00 30 mL
  Filled 2019-01-02: qty 30

## 2019-01-02 MED ORDER — LEVOFLOXACIN 500 MG PO TABS: 500.0000 mg | ORAL_TABLET | Freq: Every day | ORAL | 0 refills | Status: DC

## 2019-01-02 MED ORDER — CLOPIDOGREL BISULFATE 75 MG PO TABS: 75.0000 mg | ORAL_TABLET | Freq: Every day | ORAL | Status: AC

## 2019-01-02 MED ORDER — JEVITY 1.2 CAL PO LIQD
1000.0000 mL | ORAL | Status: DC
  Administered 2019-01-02: 1000 mL

## 2019-01-02 NOTE — Discharge Summary (Addendum)
Physician Discharge Summary  Scott Rivera RUE:[id number] DOB: [birth year] DOA: 12/31/2018  PCP: [facility]  Admit date: 12/31/2018 Discharge date: 01/02/2019  Admitted From: Home Disposition: Home  Recommendations for Outpatient Follow-up:  1. Follow up with PCP in 1 week 2. Follow up with GI with regard to endoscopy results (biopsy, H. Pylori) 3. Please obtain BMP/CBC in one week 4. Please follow up on the following pending results: As mentioned above; respiratory culture  Home health: PT, RN Equipment/Devices: air mattress, tube feeds  Discharge Condition: Stable CODE STATUS: Full code Diet recommendation: Tube feeds   Brief/Interim Summary:  Admission HPI written by Nita Sells, MD   Chief Complaint: coughing up blood ? 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 trach PEG Chronic loculated pleural effusion pressure ulcers, prior fecal impaction  Last admitted 12/24 through 12/30 1 black vomit everywhere concern for perforated viscus he was felt to be very high risk candidate for surgery at the time-treated for right LL pneumonia hypoxic anemic respiratory failure and hypernatremia at the time as well  Sister gives me the entire story as patient is unable although he is more pleasant and conversant than he was earlier this morning Over the past several days since last week has been having increased secretions-obtained what sounds like scopolamine patch from PCP regarding secretions Secretions kept getting worse-tan consistency-started becoming darker in color-this morning at home patient's sisters are noted patient bent over and vomiting bright red blood through his mouth and through the trach This is the first episode he has done this--- however looking back through notes he has been to the ED 5 times at [facility] he was vomiting and it looks like he had been sent home-Per the nursing report at triage on 8/10 he has  been having coffee-ground emesis   is on Plavix    Hospital course:  Upper GI bleeding Recurrent issue of hemoptysis. GI consulted with plans for EGD today. Plavix held. 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 Biopsy obtained. H. Pylori pending. Resume Plavix in 48 hours.  Concern for aspiration pneumonia Sputum culture pending. Started on Zosyn. Leukocytosis improving. Afebrile. Procalcitonin down from admission. Transitioned to Levaquin on discharge. Culture data pending  Chronic respiratory failure S/p tracheostomy. PCCM consulted. Recommendations for cuffless trach on discharge.  Right humeral head osteonecrosis Outpatient follow-up   Discharge Diagnoses:  Active Problems:   GI bleed   Aspiration pneumonia [facility])    Discharge Instructions  Discharge Instructions    Call MD for:  difficulty breathing, headache or visual disturbances   Complete by: As directed    Call MD for:  temperature >100.4   Complete by: As directed    Diet - low sodium heart healthy   Complete by: As directed      Allergies as of 01/02/2019   No Known Allergies     Medication List    STOP taking these medications   aspirin 81 MG chewable tablet   atropine 1 % ophthalmic solution   bisacodyl 10 MG suppository Commonly known as: DULCOLAX   clopidogrel 75 MG tablet Commonly known as: PLAVIX   ipratropium-albuterol 0.5-2.5 (3) MG/3ML Soln Commonly known as: DUONEB   polyethylene glycol 17 g packet Commonly known as: MiraLax     TAKE these medications   acetaminophen 500 MG tablet Commonly known as: TYLENOL Place 1,000 mg into feeding tube 3 (three) times daily.  amLODipine 10 MG tablet Commonly known as: NORVASC Place 1 tablet (10 mg total) into feeding tube daily.   cloNIDine 0.1 mg/24hr patch Commonly known as: CATAPRES - Dosed in mg/24 hr Place 1 patch (0.1 mg total) onto the skin  every Tuesday.   feeding supplement (JEVITY 1.2 CAL) Liqd Initiate at 35 ml/hr and increase by 10 ml every 8 hours to reach goal rate of 65 ml/hr. What changed:   how much to take  how to take this  when to take this  additional instructions  how fast to infuse this   free water Soln Place 100 mLs into feeding tube every 3 (three) hours.   gabapentin 100 MG capsule Commonly known as: NEURONTIN 100 mg See admin instructions. Open 1 capsule (100 mg) and administer contents via feeding tube three times daily   glycopyrrolate 1 MG tablet Commonly known as: ROBINUL Place 1 mg into feeding tube 2 (two) times daily.   Iron 325 (65 Fe) MG Tabs Place 325 mg into feeding tube daily.   levofloxacin 500 MG tablet Commonly known as: Levaquin Take 1 tablet (500 mg total) by mouth daily for 3 days. Start taking on: January 03, 2019   metoprolol tartrate 50 MG tablet Commonly known as: LOPRESSOR Take 1 tablet (50 mg total) by mouth 2 (two) times daily.   mirtazapine 15 MG tablet Commonly known as: REMERON Take 7.5 mg by mouth at bedtime.   montelukast 10 MG tablet Commonly known as: SINGULAIR Place 10 mg into feeding tube daily.   ondansetron 4 MG tablet Commonly known as: ZOFRAN Place 1 tablet (4 mg total) into feeding tube every 6 (six) hours as needed for nausea.   pantoprazole sodium 40 mg/20 mL Pack Commonly known as: PROTONIX Place 20 mLs (40 mg total) into feeding tube 2 (two) times daily.   pravastatin 20 MG tablet Commonly known as: PRAVACHOL Place 1 tablet (20 mg total) into feeding tube daily at 6 PM. What changed: when to take this   scopolamine 1 MG/3DAYS Commonly known as: TRANSDERM-SCOP Place 1 mg onto the skin every 3 (three) days.   thiamine 100 MG tablet Place 1 tablet (100 mg total) into feeding tube daily.   vitamin C 500 MG tablet Commonly known as: ASCORBIC ACID Take 500 mg by mouth daily.            Durable Medical Equipment  (From  admission, onward)         Start     Ordered   01/02/19 1205  For home use only DME Air overlay mattress  Once     01/02/19 1204          No Known Allergies  Consultations:  [facility]   Procedures/Studies: Dg Abdomen Peg Tube Location  Result Date: 12/31/2018 CLINICAL DATA:  68 year old male with PEG tube. Evaluate for patency. EXAM: ABDOMEN - 1 VIEW COMPARISON:  Abdominal radiograph dated 11/18/2018 FINDINGS: Contrast injected via the percutaneous gastrostomy is noted within the stomach and duodenal C-loop. No extraluminal contrast identified. There is large amount of stool throughout the colon. No bowel dilatation. There is degenerative changes of the spine. Midline surgical staples noted. IMPRESSION: Percutaneous gastrostomy within the distal stomach. No extraluminal contrast identified. Electronically Signed   By: Anner Crete M.D.   On: 12/31/2018 09:44   Dg Chest Port 1 View  Result Date: 01/01/2019 CLINICAL DATA:  Pneumonia. EXAM: PORTABLE CHEST 1 VIEW COMPARISON:  Chest x-ray from yesterday. FINDINGS: Unchanged tracheostomy tube with  tip at the thoracic inlet. The heart size and mediastinal contours are within normal limits. New mild pulmonary vascular congestion. Unchanged opacity at the medial right lung base. No pleural effusion or pneumothorax. No acute osseous abnormality. 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 IMPRESSION: 1. New mild pulmonary vascular congestion. 2. Unchanged right medial basilar atelectasis versus infiltrate. Electronically Signed   By: Titus Dubin M.D.   On: 01/01/2019 08:20   Dg Chest Portable 1 View  Result Date: 12/31/2018 CLINICAL DATA:  Increased congestion. EXAM: PORTABLE CHEST 1 VIEW COMPARISON:  11/24/2018. FINDINGS: Tracheostomy tube stable position. Heart size normal. Low lung volumes with mild bibasilar atelectasis/infiltrates. No pleural effusion or pneumothorax. Surgical clips left upper scratched it surgical clips  left chest. 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 IMPRESSION: 1.  Tracheostomy tube stable position. 2.  Low lung volumes with mild bibasilar atelectasis/infiltrates. 3. Sclerotic changes left humeral head consistent with avascular necrosis. Electronically Signed   By: Marcello Moores  Register   On: 12/31/2018 06:25     9/16: EGD Impression:               - LA Grade D esophagitis. Biopsied.                           - Small hiatal hernia.                           - Non-bleeding erosive gastropathy. Erythematous                            mucosa in the gastric body.                           - Intact gastrostomy with a patent G-tube present                            characterized by ulceration.                           - Duodenal erosions without bleeding.                           - Normal major papilla.                           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 Biopsied.  Recommendation:           - The patient will be observed post-procedure,                            until all discharge criteria are met.                           - Return patient to hospital ward for ongoing care.                           -  Continue IV PPI BID x 24 hours and then                            transition to PGT 40 mg PPI BID thereafter.                           - Send H. pylori serology and treat if positive.                           - Await pathology results.                           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 Patient                            would need EMR for this lesion and as he had not                            been off Plavix for a full 5-days prior to                            procedure and without discussion being had decision                            made to only biopsy. He can follow up based on                            biopsy results with his [facility] GI providers as                             previously established or with our group if desired.                           - Observe patient's clinical course.                           - OK to restart feeding (I believe per G tube                            feeding).                           - Plavix if possible to hold for 48 hours to                            decrease risk of bleeding post-biopsy would be                            ideal.                           - I tried to call the patient's sister on multiple  attempts but was not successful and will try to                            reach out to her again later if possible (unable to                            leave voicemails).                           - The findings and recommendations were discussed                            with the patient.                           - The findings and recommendations were discussed                            with the referring physician.   Subjective: Unable to communicate secondary to trach. Seems to want to go home.  Discharge Exam: Vitals:   01/02/19 1200 01/02/19 1300  BP:    Pulse: (!) 104 (!) 112  Resp:  (!) 22  Temp:    SpO2: 99% 98%   Vitals:   01/02/19 1000 01/02/19 1100 01/02/19 1200 01/02/19 1300  BP: (!) 158/68     Pulse: 94 93 (!) 104 (!) 112  Resp: 19 18  (!) 22  Temp:      TempSrc:      SpO2: 99% 97% 99% 98%  Weight:      Height:        General: Pt is alert, awake, not in acute distress Cardiovascular: RRR, S1/S2 +, no rubs, no gallops Respiratory: Rhonchi, no wheezing Abdominal: Soft, NT, ND, bowel sounds + Extremities: no edema, no cyanosis    The results of significant diagnostics from this hospitalization (including imaging, microbiology, ancillary and laboratory) are listed below for reference.     Microbiology: Recent Results (from the past 240 hour(s))  Blood culture (routine x 2)     Status: None (Preliminary result)   Collection Time:  12/31/18  8:00 AM   Specimen: BLOOD  Result Value Ref Range Status   Specimen Description   Final    BLOOD RIGHT ANTECUBITAL Performed at [facility] [address], [place], [place] [address]    Special Requests   Final    BOTTLES DRAWN AEROBIC AND ANAEROBIC Blood Culture results may not be optimal due to an excessive volume of blood received in culture bottles Performed at [facility] [address], [place], [place] [address]    Culture   Final    NO GROWTH 2 DAYS Performed at [facility] [address], [place], [place] [address]    Report Status PENDING  Incomplete  SARS CORONAVIRUS 2 (TAT 6-24 HRS) Nasopharyngeal Nasopharyngeal Swab     Status: None   Collection Time: 12/31/18  8:19 AM   Specimen: Nasopharyngeal Swab  Result Value Ref Range Status   SARS Coronavirus 2 NEGATIVE NEGATIVE Final    Comment: (NOTE) SARS-CoV-2 target nucleic acids are NOT DETECTED. The SARS-CoV-2 RNA is generally detectable in upper and lower respiratory specimens during the acute phase of infection. Negative  results do not preclude SARS-CoV-2 infection, do not rule out co-infections with other pathogens, and should not be used as the sole basis for treatment or other patient management decisions. Negative results must be combined with clinical observations, patient history, and epidemiological information. The expected result is Negative. Fact Sheet for Patients: SugarRoll.be Fact Sheet for Healthcare Providers: https://www.woods-mathews.com/ This test is not yet approved or cleared by the [place] FDA and  has been authorized for detection and/or diagnosis of SARS-CoV-2 by FDA under an Emergency Use Authorization (EUA). This EUA will remain  in effect (meaning this test can be used) for the duration of the COVID-19 declaration under Section 56 4(b)(1) of the Act, 21 U.S.C. section 360bbb-3(b)(1),  unless the authorization is terminated or revoked sooner. Performed at [facility], [place] [address] [place], [place] [address]   Blood culture (routine x 2)     Status: None (Preliminary result)   Collection Time: 12/31/18  8:55 AM   Specimen: BLOOD  Result Value Ref Range Status   Specimen Description   Final    BLOOD BLOOD RIGHT FOREARM Performed at [facility] [address] [place], [place] [address]    Special Requests   Final    BOTTLES DRAWN AEROBIC AND ANAEROBIC Blood Culture results may not be optimal due to an excessive volume of blood received in culture bottles Performed at [facility] [address] [place], [place] [address]    Culture   Final    NO GROWTH 2 DAYS Performed at [facility] [address] [place], [place] [address]    Report Status PENDING  Incomplete  MRSA PCR Screening     Status: None   Collection Time: 12/31/18  4:54 PM   Specimen: Nasopharyngeal  Result Value Ref Range Status   MRSA by PCR NEGATIVE NEGATIVE Final    Comment:        The GeneXpert MRSA Assay (FDA approved for NASAL specimens only), is one component of a comprehensive MRSA colonization surveillance program. It is not intended to diagnose MRSA infection nor to guide or monitor treatment for MRSA infections. Performed at [facility], [place] [address] [place], [place] [address]   Culture, sputum-assessment     Status: None   Collection Time: 12/31/18  5:56 PM   Specimen: Sputum  Result Value Ref Range Status   Specimen Description SPU  Final   Special Requests NONE  Final   Sputum evaluation   Final    THIS SPECIMEN IS ACCEPTABLE FOR SPUTUM CULTURE Performed at [facility], [place] [address] [place], [place] [address]    Report Status 12/31/2018 FINAL  Final  Culture, respiratory     Status: None (Preliminary result)   Collection Time: 12/31/18  5:56 PM   Specimen: Sputum    Result Value Ref Range Status   Specimen Description   Final    SPU Performed at [facility] [address], [place], [place] [address]    Special Requests   Final    NONE Reflexed from [id number] Performed at [facility], [place] [address] [place], [place] [address]    Gram Stain   Final    FEW WBC PRESENT, PREDOMINANTLY PMN FEW GRAM POSITIVE COCCI IN PAIRS RARE GRAM NEGATIVE RODS RARE GRAM POSITIVE RODS    Culture   Final    CULTURE REINCUBATED FOR BETTER GROWTH Performed at [facility], [place]  [address], [place], [place] [address]    Report Status PENDING  Incomplete     Labs: BNP (last 3 results) No results for input(s): BNP in the last 8760 hours. Basic Metabolic Panel: Recent Labs  Lab 12/31/18 0814 01/01/19 0214  NA 143 143  K 4.6 4.8  CL 102 106  CO2 30 27  GLUCOSE 91 104*  BUN 54* 38*  CREATININE 1.00 0.98  CALCIUM 8.2* 9.3   Liver Function Tests: Recent Labs  Lab 12/31/18 0814 01/01/19 0214  AST 18 20  ALT 14 16  ALKPHOS 70 73  BILITOT 0.3 0.4  PROT 7.1 7.4  ALBUMIN 2.6* 3.3*   Recent Labs  Lab 12/31/18 0814  LIPASE 24   No results for input(s): AMMONIA in the last 168 hours. CBC: Recent Labs  Lab 12/31/18 0650 12/31/18 1722 01/01/19 0214 01/02/19 0208  WBC 13.8* 16.7* 12.9* 10.3  NEUTROABS 10.1* 12.6*  --   --   HGB 13.4 11.4* 12.1* 10.8*  HCT 44.4 38.6* 41.2 35.5*  MCV 98.0 101.3* 101.0* 96.5  PLT 366 306 243 187   Cardiac Enzymes: No results for input(s): CKTOTAL, CKMB, CKMBINDEX, TROPONINI in the last 168 hours. BNP: Invalid input(s): POCBNP CBG: Recent Labs  Lab 01/02/19 1133  GLUCAP 78   D-Dimer No results for input(s): DDIMER in the last 72 hours. Hgb A1c No results for input(s): HGBA1C in the last 72 hours. Lipid Profile No results for input(s): CHOL, HDL, LDLCALC, TRIG, CHOLHDL, LDLDIRECT in the last 72 hours. Thyroid function studies No results for input(s): TSH,  T4TOTAL, T3FREE, THYROIDAB in the last 72 hours.  Invalid input(s): FREET3 Anemia work up No results for input(s): VITAMINB12, FOLATE, FERRITIN, TIBC, IRON, RETICCTPCT in the last 72 hours. Urinalysis    Component Value Date/Time   COLORURINE YELLOW 12/31/2018 1849   APPEARANCEUR HAZY (A) 12/31/2018 1849   LABSPEC 1.012 12/31/2018 1849   PHURINE 6.0 12/31/2018 1849   GLUCOSEU NEGATIVE 12/31/2018 1849   HGBUR MODERATE (A) 12/31/2018 1849   BILIRUBINUR NEGATIVE 12/31/2018 1849   KETONESUR NEGATIVE 12/31/2018 1849   PROTEINUR 30 (A) 12/31/2018 1849   NITRITE NEGATIVE 12/31/2018 1849   LEUKOCYTESUR LARGE (A) 12/31/2018 1849   Sepsis Labs Invalid input(s): PROCALCITONIN,  WBC,  LACTICIDVEN Microbiology Recent Results (from the past 240 hour(s))  Blood culture (routine x 2)     Status: None (Preliminary result)   Collection Time: 12/31/18  8:00 AM   Specimen: BLOOD  Result Value Ref Range Status   Specimen Description   Final    BLOOD RIGHT ANTECUBITAL Performed at [facility], [place] [address], [place], [place] [address]    Special Requests   Final    BOTTLES DRAWN AEROBIC AND ANAEROBIC Blood Culture results may not be optimal due to an excessive volume of blood received in culture bottles Performed at [facility] [address], [place], [place] [address]    Culture   Final    NO GROWTH 2 DAYS Performed at [facility] [address], [place], [place] [address]    Report Status PENDING  Incomplete  SARS CORONAVIRUS 2 (TAT 6-24 HRS) Nasopharyngeal Nasopharyngeal Swab     Status: None   Collection Time: 12/31/18  8:19 AM   Specimen: Nasopharyngeal Swab  Result Value Ref Range Status   SARS Coronavirus 2 NEGATIVE NEGATIVE Final    Comment: (NOTE) SARS-CoV-2 target nucleic acids are NOT DETECTED. The SARS-CoV-2 RNA is generally detectable in upper and  lower respiratory specimens during the acute phase of infection.  Negative results do not preclude SARS-CoV-2 infection, do not rule out co-infections with other pathogens, and should not be used as the sole basis for treatment or other patient management decisions. Negative results must be combined with clinical observations, patient history, and epidemiological information. The expected result is Negative. Fact Sheet for Patients: SugarRoll.be Fact Sheet for Healthcare Providers: https://www.woods-mathews.com/ This test is not yet approved or cleared by the [place] FDA and  has been authorized for detection and/or diagnosis of SARS-CoV-2 by FDA under an Emergency Use Authorization (EUA). This EUA will remain  in effect (meaning this test can be used) for the duration of the COVID-19 declaration under Section 56 4(b)(1) of the Act, 21 U.S.C. section 360bbb-3(b)(1), unless the authorization is terminated or revoked sooner. Performed at [facility], [place] [address] [place], [place] [address]   Blood culture (routine x 2)     Status: None (Preliminary result)   Collection Time: 12/31/18  8:55 AM   Specimen: BLOOD  Result Value Ref Range Status   Specimen Description   Final    BLOOD BLOOD RIGHT FOREARM Performed at [facility] [address], [place], [place] [address]    Special Requests   Final    BOTTLES DRAWN AEROBIC AND ANAEROBIC Blood Culture results may not be optimal due to an excessive volume of blood received in culture bottles Performed at [facility] [address], [place], [place] [address]    Culture   Final    NO GROWTH 2 DAYS Performed at [facility] [address], [place], [place] [address]    Report Status PENDING  Incomplete  MRSA PCR Screening     Status: None   Collection Time: 12/31/18  4:54 PM   Specimen: Nasopharyngeal  Result Value Ref Range Status   MRSA by PCR NEGATIVE NEGATIVE Final    Comment:        The  GeneXpert MRSA Assay (FDA approved for NASAL specimens only), is one component of a comprehensive MRSA colonization surveillance program. It is not intended to diagnose MRSA infection nor to guide or monitor treatment for MRSA infections. Performed at [facility], [place] [address], [place], [place] [address]   Culture, sputum-assessment     Status: None   Collection Time: 12/31/18  5:56 PM   Specimen: Sputum  Result Value Ref Range Status   Specimen Description SPU  Final   Special Requests NONE  Final   Sputum evaluation   Final    THIS SPECIMEN IS ACCEPTABLE FOR SPUTUM CULTURE Performed at [facility], [place] [address], [place], [place] [address]    Report Status 12/31/2018 FINAL  Final  Culture, respiratory     Status: None (Preliminary result)   Collection Time: 12/31/18  5:56 PM   Specimen: Sputum  Result Value Ref Range Status   Specimen Description   Final    SPU Performed at [facility] [address] [place], [place] [address]    Special Requests   Final    NONE Reflexed from [id number] Performed at [facility], [place] [address] [place], [place] [address]    Gram Stain   Final    FEW WBC PRESENT, PREDOMINANTLY PMN FEW GRAM POSITIVE COCCI IN PAIRS RARE GRAM NEGATIVE RODS RARE GRAM POSITIVE RODS    Culture   Final    CULTURE REINCUBATED FOR BETTER  GROWTH Performed at [facility], [place] [address], [place], [place] [address]    Report Status PENDING  Incomplete     Time coordinating discharge: 35 minutes  SIGNED:   Cordelia Poche, MD [facility] 01/02/2019, 2:00 PM

## 2019-01-02 NOTE — TOC Initial Note (Signed)
Transition of Care [facility]) - Initial/Assessment Note    Patient Details  Name: Scott Rivera MRN: [id number] Date of Birth: 02-Oct-1950  Transition of Care [facility]) CM/SW Contact:    Jeani Fassnacht, Marjie Skiff, RN Phone Number:[phone] 01/02/2019, 3:04 PM  Clinical Narrative:                 Per conversation with sister [name] via phone they have PCS services several hours a day. She is interested in additional home health services and referral made to [facility]. She states she has all DME and supplies needed included home 02. For [facility] transport home. Medical Necessity form filled out and printed for RN staff to call [facility]. Sister ready for pt to dc home. RN asked to document that pt is on low air loss mattress in hospital and measurement of any wounds so this may be sent to [place] for a low air loss at home. This CM will fax documentation if completed.  Expected Discharge Plan: [place] Barriers to Discharge: No Barriers Identified   Patient Goals and CMS Choice Patient states their goals for this hospitalization and ongoing recovery are:: Unable to communicate   Choice offered to / list presented to : Sibling  Expected Discharge Plan and Services Expected Discharge Plan: [place]   Discharge Planning Services: CM Consult Post Acute Care Choice: [place] arrangements for the past 2 months: Single Family Home Expected Discharge Date: 01/02/19                         HH Arranged: RN, PT, Social Work [employer] Agency: [facility] [employer] [facility] [employer] Date [place]: 01/02/19 Time [place]: [place] Representative spoke with at [place]: [place] Arrangements/Services Living arrangements for the past 2 months: [place] Lives with:: Siblings Patient language and need for interpreter reviewed:: Yes        Need for Family Participation in Patient Care: Yes (Comment) Care giver support  system in place?: Yes (comment) Current home services: Other (comment)([facility] home aides) Criminal Activity/Legal Involvement Pertinent to Current Situation/Hospitalization: No - Comment as needed  Activities of Daily Living Home Assistive Devices/Equipment: Oxygen, Nebulizer, Feeding equipment, Hospital bed, [employer], Other (Comment), Vent/Trach supplies(G-tube, foley catheter, trach, voice box valve, air mattress) ADL Screening (condition at time of admission) Patient's cognitive ability adequate to safely complete daily activities?: No Is the patient deaf or have difficulty hearing?: No Does the patient have difficulty seeing, even when wearing glasses/contacts?: No Does the patient have difficulty concentrating, remembering, or making decisions?: Yes Patient able to express need for assistance with ADLs?: No Does the patient have difficulty dressing or bathing?: Yes Independently performs ADLs?: No Communication: Independent, Dependent Is this a change from baseline?: Pre-admission baseline Dressing (OT): Dependent Is this a change from baseline?: Pre-admission baseline Grooming: Dependent Is this a change from baseline?: Pre-admission baseline Feeding: Dependent Is this a change from baseline?: Pre-admission baseline Bathing: Dependent Is this a change from baseline?: Pre-admission baseline Toileting: Dependent Is this a change from baseline?: Pre-admission baseline In/Out Bed: Dependent Is this a change from baseline?: Pre-admission baseline Walks in Home: Dependent Is this a change from baseline?: Pre-admission baseline Does the patient have difficulty walking or climbing stairs?: Yes(secondary to weakness) Weakness of Legs: Both Weakness of Arms/Hands: Both  Permission Sought/Granted Permission sought to share information with : Chartered certified accountant granted  to share information with : Yes, Verbal Permission Granted     Permission granted to share  info w AGENCY: [facility]        Emotional Assessment Appearance:: Appears stated age            Admission diagnosis:  Vomiting [R11.10] Pneumonia [J18.9] Gastrointestinal tube present [facility]) [Z93.1] Patient Active Problem List   Diagnosis Date Noted  . Aspiration pneumonia ([place]) 01/01/2019  . GI bleed 12/31/2018  . Pneumoperitoneum 04/09/2018  . Perforated viscus 04/09/2018  . 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 Aspiration of gastric contents   . Malnutrition of moderate degree 11/19/2017  . Aspiration pneumonia of right lower lobe ([place]) 11/16/2017  . Nausea and vomiting 11/16/2017  . Tracheostomy tube present ([place]) 09/25/2017  . Chronic respiratory insufficiency 07/24/2017  . Hematuria 06/26/2017  . Foley catheter in place on admission 03/30/2017  . Diabetes mellitus ([place]) 03/14/2017  . History of tracheostomy 03/14/2017  . Percutaneous endoscopic gastrostomy status ([place]) 03/14/2017  . Gastroesophageal reflux disease without esophagitis 03/14/2017  . Acute respiratory failure with hypoxia ([place]) 03/02/2017  . Loculated pleural effusion   . CKD (chronic kidney disease) stage 2, GFR 60-89 ml/min 01/02/2017  . Anemia due to chronic kidney disease 12/09/2016  . Compensated metabolic alkalosis [id number]  . Goals of care, counseling/discussion   . Palliative care by specialist   . Aspiration into airway   . Dysphagia, post-stroke   . Tachypnea   . Leukocytosis   . Acute blood loss anemia   . Pressure injury of skin 10/20/2016  . Essential hypertension 10/19/2016  . History of cerebral infarction 10/19/2016  . Bilateral chronic knee pain 01/07/2016  . Tobacco abuse 12/23/2014  . Mixed hyperlipidemia 07/28/2013   PCP:  [facility], [place]:   [place], [place] [address] Phone: [phone] Fax: [facility] [phone] - [place], [place] - [address] AT [place] [address] [place] [address] Phone: [phone] Fax: [phone]     Social Determinants of Health (SDOH) Interventions    Readmission Risk Interventions No flowsheet data found.

## 2019-01-02 NOTE — Progress Notes (Signed)
Upon [facility] arrival, pt blood pressure was 192/90 and repeat was 207/99. Dr. Lonny Prude notified and made aware who stated that pt is still able to be discharged and to let family know to give his scheduled medications when appropriate tonight. Pt is otherwise stable with no distress noted. PT[facility] is clearing transport home with increased blood pressure with their supervisor as well.

## 2019-01-02 NOTE — Discharge Instructions (Signed)
Scott Rivera,  You are in the hospital because of vomiting blood.  This appears to be secondary to his significant inflammation of your esophagus

## 2019-01-02 NOTE — Progress Notes (Signed)
Scott Rivera, Scott Rivera 1237 d/c'd to sister's house, called his sister [name] to let her know he was leaving now. Patient has been very agitated, refusing care. Dr Teryl Lucy called to let him know. He has been uncooperative most all day.

## 2019-01-02 NOTE — Anesthesia Postprocedure Evaluation (Signed)
Anesthesia Post Note  Patient: Scott Rivera  Procedure(s) Performed: ESOPHAGOGASTRODUODENOSCOPY (EGD) WITH PROPOFOL (N/A ) BIOPSY     Patient location during evaluation: PACU Anesthesia Type: MAC Level of consciousness: awake and alert Pain management: pain level controlled Vital Signs Assessment: post-procedure vital signs reviewed and stable Respiratory status: spontaneous breathing, nonlabored ventilation, respiratory function stable and patient connected to nasal cannula oxygen Cardiovascular status: stable and blood pressure returned to baseline Postop Assessment: no apparent nausea or vomiting Anesthetic complications: no    Last Vitals:  Vitals:   01/02/19 0600 01/02/19 0700  BP: (!) 170/127 (!) 157/48  Pulse: 97 94  Resp: (!) 22   Temp:    SpO2: 97% 95%    Last Pain:  Vitals:   01/01/19 2311  TempSrc: Axillary  PainSc:                  Tiajuana Amass

## 2019-01-02 NOTE — Evaluation (Signed)
SLP Cancellation Note  Patient Details Name: Scott Rivera MRN: [id number] DOB: [birth year]   Cancelled treatment:       Reason Eval/Treat Not Completed: Other (comment);Medical issues which prohibited therapy(pt with significant amount of secretions per RT, will continue efforts)   Macario Golds 01/02/2019, 12:48 PM   Luanna Salk, [place] [facility] SLP Acute Rehab Services Pager [phone] Office [phone]

## 2019-01-02 NOTE — Progress Notes (Signed)
Performed another skin assessment on patient and noted only scarring in 3 places from possible old wounds. Two scars noted 4cm x 2cm on the left buttocks and 4cm x 2cm on right buttocks. Another old scar is noted on coccyx area measuring 6cm x 3cm. Foam placed on sacrum. Pt currently on low air loss mattress and will need one at home once discharged.   [facility] called and arranged transportation back home.

## 2019-01-02 NOTE — Progress Notes (Signed)
Nutrition Follow-up  DOCUMENTATION CODES:   Not applicable  INTERVENTION:  - will order TF: Jevity 1.2 @ 35 ml/hr to advanced by 10 ml every 8 hours to reach goal rate of 65 ml/hr with 30 ml prostat once/day. - at goal rate, this regimen will provide 1972 kcal, 102 grams protein, and 1279 ml free water.    NUTRITION DIAGNOSIS:   Inadequate oral intake related to inability to eat as evidenced by NPO status. -ongoing  GOAL:   Patient will meet greater than or equal to 90% of their needs -unmet at this time.  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 He presented to the ED on 9/15 after several days of increased secretions that were tan-colored but progressively darkening. On 9/15 he was seen vomiting bright red blood through mouth and trach. Patient does not appear to use a PMV so he is essentially non-verbal, but does mouth words and motion.  Patient remains NPO. PEG in place and consult received for TF. 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  Will order TF as outlined above. Slower advancement given medical course and findings of EGD. Order in place for 100 ml free water x8/day (800 ml/day)   Labs reviewed; BUN: 38 mg/dl. Medications reviewed; 40 mg IV protonix BID.  IVF; D5-1/2 NS @ 50 ml/hr (204 kcal).     Diet Order:   Diet Order            Diet NPO time specified Except for: Sips with Meds, Other (See Comments)  Diet effective now              EDUCATION NEEDS:   No education needs  have been identified at this time  Skin:  Skin Assessment: Reviewed RN Assessment  Last BM:  9/16  Height:   Ht Readings from Last 1 Encounters:  12/31/18 5\' 6"  (1.676 m)    Weight:   Wt Readings from Last 1 Encounters:  12/31/18 77.9 kg    Ideal Body Weight:  64.5 kg  BMI:  Body mass index is 27.72 kg/m.  Estimated Nutritional Needs:   Kcal:  1945-2180 kcal  Protein:  95-105 grams  Fluid:  >/= 2 L/day     Jarome Matin, MS, RD, LDN, [facility] Inpatient Clinical Dietitian Pager # [phone] After hours/weekend pager # [phone]

## 2019-01-02 NOTE — Progress Notes (Signed)
OT Cancellation Note  Patient Details Name: Scott Rivera MRN: [id number] DOB: 07-24-1950   Cancelled Treatment:    Reason Eval/Treat Not Completed: Other (comment).  Pt restless at this time. Called sister, whom he lives with.  They use a lift for OOB. He has a hospital bed and can roll on his own for adls and help with donning a shirt.  Uses arms well per sister and she does ROM exercises with him.  Pt had a nurse or aide 10-8 before COVID.  Sister is working with agency to get someone else to help.  Will return later or tomorrow  Scott Rivera 01/02/2019, 11:00 AM  Lesle Chris, OTR/L Acute Rehabilitation Services [phone] [facility] pager [phone] office 01/02/2019

## 2019-01-03 ENCOUNTER — Encounter (HOSPITAL_COMMUNITY): Payer: Self-pay | Admitting: Gastroenterology

## 2019-01-03 LAB — H. PYLORI ANTIBODY, IGG: H Pylori IgG: 1.8 Index Value — ABNORMAL HIGH (ref 0.00–0.79)

## 2019-01-04 LAB — CULTURE, RESPIRATORY W GRAM STAIN

## 2019-01-05 LAB — CULTURE, BLOOD (ROUTINE X 2)
Culture: NO GROWTH
Culture: NO GROWTH

## 2019-01-08 ENCOUNTER — Other Ambulatory Visit: Payer: Self-pay

## 2019-01-08 ENCOUNTER — Inpatient Hospital Stay (HOSPITAL_COMMUNITY)
Admission: EM | Admit: 2019-01-08 | Discharge: 2019-01-11 | DRG: 177 | Disposition: A | Discharge status: Home / Self Care | Payer: Medicare HMO | Attending: Internal Medicine | Admitting: Internal Medicine

## 2019-01-08 ENCOUNTER — Telehealth: Payer: Self-pay | Admitting: Gastroenterology

## 2019-01-08 ENCOUNTER — Encounter (HOSPITAL_COMMUNITY): Payer: Self-pay

## 2019-01-08 ENCOUNTER — Encounter: Payer: Self-pay | Admitting: Gastroenterology

## 2019-01-08 ENCOUNTER — Emergency Department (HOSPITAL_COMMUNITY): Payer: Medicare HMO

## 2019-01-08 DIAGNOSIS — J690 Pneumonitis due to inhalation of food and vomit: Secondary | ICD-10-CM | POA: Diagnosis present

## 2019-01-08 DIAGNOSIS — K209 Esophagitis, unspecified: Secondary | ICD-10-CM | POA: Diagnosis present

## 2019-01-08 DIAGNOSIS — Z8673 Personal history of transient ischemic attack (TIA), and cerebral infarction without residual deficits: Secondary | ICD-10-CM | POA: Diagnosis not present

## 2019-01-08 DIAGNOSIS — Z9889 Other specified postprocedural states: Secondary | ICD-10-CM | POA: Diagnosis not present

## 2019-01-08 DIAGNOSIS — Z960 Presence of urogenital implants: Secondary | ICD-10-CM | POA: Diagnosis not present

## 2019-01-08 DIAGNOSIS — Z87891 Personal history of nicotine dependence: Secondary | ICD-10-CM

## 2019-01-08 DIAGNOSIS — D631 Anemia in chronic kidney disease: Secondary | ICD-10-CM | POA: Diagnosis present

## 2019-01-08 DIAGNOSIS — Z7401 Bed confinement status: Secondary | ICD-10-CM

## 2019-01-08 DIAGNOSIS — I69391 Dysphagia following cerebral infarction: Secondary | ICD-10-CM | POA: Diagnosis not present

## 2019-01-08 DIAGNOSIS — I129 Hypertensive chronic kidney disease with stage 1 through stage 4 chronic kidney disease, or unspecified chronic kidney disease: Secondary | ICD-10-CM | POA: Diagnosis present

## 2019-01-08 DIAGNOSIS — Z931 Gastrostomy status: Secondary | ICD-10-CM

## 2019-01-08 DIAGNOSIS — I2510 Atherosclerotic heart disease of native coronary artery without angina pectoris: Secondary | ICD-10-CM | POA: Diagnosis present

## 2019-01-08 DIAGNOSIS — Z86010 Personal history of colonic polyps: Secondary | ICD-10-CM

## 2019-01-08 DIAGNOSIS — R1310 Dysphagia, unspecified: Secondary | ICD-10-CM | POA: Diagnosis present

## 2019-01-08 DIAGNOSIS — E875 Hyperkalemia: Secondary | ICD-10-CM | POA: Diagnosis present

## 2019-01-08 DIAGNOSIS — K920 Hematemesis: Secondary | ICD-10-CM | POA: Diagnosis present

## 2019-01-08 DIAGNOSIS — L8990 Pressure ulcer of unspecified site, unspecified stage: Secondary | ICD-10-CM | POA: Diagnosis present

## 2019-01-08 DIAGNOSIS — N390 Urinary tract infection, site not specified: Secondary | ICD-10-CM | POA: Diagnosis present

## 2019-01-08 DIAGNOSIS — J189 Pneumonia, unspecified organism: Secondary | ICD-10-CM | POA: Diagnosis not present

## 2019-01-08 DIAGNOSIS — I10 Essential (primary) hypertension: Secondary | ICD-10-CM | POA: Diagnosis not present

## 2019-01-08 DIAGNOSIS — J9601 Acute respiratory failure with hypoxia: Secondary | ICD-10-CM | POA: Diagnosis present

## 2019-01-08 DIAGNOSIS — N189 Chronic kidney disease, unspecified: Secondary | ICD-10-CM

## 2019-01-08 DIAGNOSIS — Z872 Personal history of diseases of the skin and subcutaneous tissue: Secondary | ICD-10-CM

## 2019-01-08 DIAGNOSIS — Z79899 Other long term (current) drug therapy: Secondary | ICD-10-CM

## 2019-01-08 DIAGNOSIS — Z7902 Long term (current) use of antithrombotics/antiplatelets: Secondary | ICD-10-CM | POA: Diagnosis not present

## 2019-01-08 DIAGNOSIS — Y95 Nosocomial condition: Secondary | ICD-10-CM | POA: Diagnosis present

## 2019-01-08 DIAGNOSIS — E119 Type 2 diabetes mellitus without complications: Secondary | ICD-10-CM

## 2019-01-08 DIAGNOSIS — Z833 Family history of diabetes mellitus: Secondary | ICD-10-CM | POA: Diagnosis not present

## 2019-01-08 DIAGNOSIS — Z930 Tracheostomy status: Secondary | ICD-10-CM

## 2019-01-08 DIAGNOSIS — E782 Mixed hyperlipidemia: Secondary | ICD-10-CM | POA: Diagnosis present

## 2019-01-08 DIAGNOSIS — A048 Other specified bacterial intestinal infections: Secondary | ICD-10-CM | POA: Diagnosis not present

## 2019-01-08 DIAGNOSIS — E1122 Type 2 diabetes mellitus with diabetic chronic kidney disease: Secondary | ICD-10-CM | POA: Diagnosis present

## 2019-01-08 DIAGNOSIS — K210 Gastro-esophageal reflux disease with esophagitis: Secondary | ICD-10-CM | POA: Diagnosis not present

## 2019-01-08 DIAGNOSIS — D132 Benign neoplasm of duodenum: Secondary | ICD-10-CM | POA: Diagnosis present

## 2019-01-08 DIAGNOSIS — Z20828 Contact with and (suspected) exposure to other viral communicable diseases: Secondary | ICD-10-CM | POA: Diagnosis present

## 2019-01-08 DIAGNOSIS — R319 Hematuria, unspecified: Secondary | ICD-10-CM

## 2019-01-08 DIAGNOSIS — N182 Chronic kidney disease, stage 2 (mild): Secondary | ICD-10-CM | POA: Diagnosis not present

## 2019-01-08 DIAGNOSIS — B9681 Helicobacter pylori [H. pylori] as the cause of diseases classified elsewhere: Secondary | ICD-10-CM | POA: Diagnosis present

## 2019-01-08 DIAGNOSIS — B965 Pseudomonas (aeruginosa) (mallei) (pseudomallei) as the cause of diseases classified elsewhere: Secondary | ICD-10-CM | POA: Diagnosis present

## 2019-01-08 DIAGNOSIS — K257 Chronic gastric ulcer without hemorrhage or perforation: Secondary | ICD-10-CM | POA: Diagnosis not present

## 2019-01-08 DIAGNOSIS — N183 Chronic kidney disease, stage 3 (moderate): Secondary | ICD-10-CM | POA: Diagnosis present

## 2019-01-08 DIAGNOSIS — Z9981 Dependence on supplemental oxygen: Secondary | ICD-10-CM

## 2019-01-08 DIAGNOSIS — B961 Klebsiella pneumoniae [K. pneumoniae] as the cause of diseases classified elsewhere: Secondary | ICD-10-CM | POA: Diagnosis present

## 2019-01-08 DIAGNOSIS — I472 Ventricular tachycardia: Secondary | ICD-10-CM | POA: Diagnosis not present

## 2019-01-08 DIAGNOSIS — Z978 Presence of other specified devices: Secondary | ICD-10-CM

## 2019-01-08 LAB — CBC WITH DIFFERENTIAL/PLATELET
Abs Immature Granulocytes: 0.07 10*3/uL (ref 0.00–0.07)
Basophils Absolute: 0.1 10*3/uL (ref 0.0–0.1)
Basophils Relative: 1 %
Eosinophils Absolute: 0.4 10*3/uL (ref 0.0–0.5)
Eosinophils Relative: 3 %
HCT: 38.7 % — ABNORMAL LOW (ref 39.0–52.0)
Hemoglobin: 11.7 g/dL — ABNORMAL LOW (ref 13.0–17.0)
Immature Granulocytes: 1 %
Lymphocytes Relative: 16 %
Lymphs Abs: 2.2 10*3/uL (ref 0.7–4.0)
MCH: 29.5 pg (ref 26.0–34.0)
MCHC: 30.2 g/dL (ref 30.0–36.0)
MCV: 97.7 fL (ref 80.0–100.0)
Monocytes Absolute: 1.5 10*3/uL — ABNORMAL HIGH (ref 0.1–1.0)
Monocytes Relative: 11 %
Neutro Abs: 9.5 10*3/uL — ABNORMAL HIGH (ref 1.7–7.7)
Neutrophils Relative %: 68 %
Platelets: 390 10*3/uL (ref 150–400)
RBC: 3.96 MIL/uL — ABNORMAL LOW (ref 4.22–5.81)
RDW: 14.5 % (ref 11.5–15.5)
WBC: 13.7 10*3/uL — ABNORMAL HIGH (ref 4.0–10.5)
nRBC: 0.1 % (ref 0.0–0.2)

## 2019-01-08 LAB — COMPREHENSIVE METABOLIC PANEL
ALT: 10 U/L (ref 0–44)
AST: 28 U/L (ref 15–41)
Albumin: 3.2 g/dL — ABNORMAL LOW (ref 3.5–5.0)
Alkaline Phosphatase: 72 U/L (ref 38–126)
Anion gap: 9 (ref 5–15)
BUN: 29 mg/dL — ABNORMAL HIGH (ref 8–23)
CO2: 27 mmol/L (ref 22–32)
Calcium: 9.4 mg/dL (ref 8.9–10.3)
Chloride: 101 mmol/L (ref 98–111)
Creatinine, Ser: 1.17 mg/dL (ref 0.61–1.24)
GFR calc Af Amer: >60 mL/min (ref >60)
GFR calc non Af Amer: >60 mL/min (ref >60)
Glucose, Bld: 86 mg/dL (ref 70–99)
Potassium: 6.2 mmol/L — ABNORMAL HIGH (ref 3.5–5.1)
Sodium: 137 mmol/L (ref 135–145)
Total Bilirubin: 0.9 mg/dL (ref 0.3–1.2)
Total Protein: 7.6 g/dL (ref 6.5–8.1)

## 2019-01-08 LAB — URINALYSIS, ROUTINE W REFLEX MICROSCOPIC
Bilirubin Urine: NEGATIVE
Glucose, UA: NEGATIVE mg/dL
Hgb urine dipstick: NEGATIVE
Ketones, ur: NEGATIVE mg/dL
Nitrite: NEGATIVE
Protein, ur: 100 mg/dL — AB
Specific Gravity, Urine: 1.016 (ref 1.005–1.030)
WBC, UA: >50 WBC/hpf — ABNORMAL HIGH (ref 0–5)
pH: 6 (ref 5.0–8.0)

## 2019-01-08 LAB — LIPASE, BLOOD: Lipase: 20 U/L (ref 11–51)

## 2019-01-08 LAB — SARS CORONAVIRUS 2 BY RT PCR (HOSPITAL ORDER, PERFORMED IN ~~LOC~~ HOSPITAL LAB): SARS Coronavirus 2: NEGATIVE

## 2019-01-08 MED ORDER — SODIUM CHLORIDE 0.9 % IV SOLN
1.0000 g | Freq: Once | INTRAVENOUS | Status: DC
  Filled 2019-01-08: qty 1

## 2019-01-08 MED ORDER — INSULIN ASPART 100 UNIT/ML IV SOLN
5.0000 [IU] | Freq: Once | INTRAVENOUS | Status: AC
  Administered 2019-01-08: 5 [IU] via INTRAVENOUS
  Filled 2019-01-08: qty 0.05

## 2019-01-08 MED ORDER — PANTOPRAZOLE SODIUM 40 MG IV SOLR
40.0000 mg | Freq: Once | INTRAVENOUS | Status: AC
  Administered 2019-01-08: 40 mg via INTRAVENOUS
  Filled 2019-01-08: qty 40

## 2019-01-08 MED ORDER — VANCOMYCIN HCL 10 G IV SOLR
1500.0000 mg | Freq: Once | INTRAVENOUS | Status: AC
  Administered 2019-01-09: 1500 mg via INTRAVENOUS
  Filled 2019-01-08: qty 1500

## 2019-01-08 MED ORDER — SODIUM CHLORIDE 0.9 % IV SOLN
2.0000 g | Freq: Once | INTRAVENOUS | Status: AC
  Administered 2019-01-08: 2 g via INTRAVENOUS
  Filled 2019-01-08: qty 2

## 2019-01-08 MED ORDER — DEXTROSE 50 % IV SOLN
1.0000 | Freq: Once | INTRAVENOUS | Status: AC
  Administered 2019-01-08: 50 mL via INTRAVENOUS
  Filled 2019-01-08: qty 50

## 2019-01-08 NOTE — ED Notes (Signed)
ED TO INPATIENT HANDOFF REPORT  Name/Age/Gender Scott Rivera 68 y.o. male  Code Status Code Status History    Date Active Date Inactive Code Status Order ID Comments User Context   12/31/2018 1654 01/02/2019 2031 Full Code [id number]  Nita Sells, MD Inpatient   04/09/2018 1657 04/17/2018 0132 Full Code [id number]  Aline August, MD Inpatient   12/18/2017 0204 12/21/2017 1636 Full Code [id number]  Ivor Costa, MD ED   11/16/2017 1854 11/22/2017 2052 Full Code [id number]  Phillips Grout, MD ED   06/27/2017 0019 07/03/2017 1641 Full Code [id number]  Vianne Bulls, MD ED   03/31/2017 1143 04/04/2017 1713 Full Code [id number]  Debbe Odea, MD Inpatient   03/02/2017 2359 03/07/2017 2252 Full Code [id number]  Toy Baker, MD Inpatient   12/22/2016 1351 01/10/2017 1708 Full Code [id number]  Donita Brooks, NP ED   12/09/2016 1139 12/21/2016 1822 Full Code [id number]  Lady Deutscher, MD Inpatient   10/19/2016 2100 11/29/2016 1620 Full Code [id number]  Rise Patience, MD Inpatient   Advance Care Planning Activity      Home/SNF/Other Home  Chief Complaint Vomiting  Level of Care/Admitting Diagnosis ED Disposition    ED Disposition Condition [facility]: [facility] [[id number]]  Level of Care: Stepdown [14]  Admit to SDU based on following criteria: Respiratory Distress:  Frequent assessment and/or intervention to maintain adequate ventilation/respiration, pulmonary toilet, and respiratory treatment.  Covid Evaluation: Asymptomatic Screening Protocol (No Symptoms)  Diagnosis: HCAP (healthcare-associated pneumonia) [id number]  Admitting Physician: Toy Baker [[id number]]  Attending Physician: Toy Baker [[id number]]  Estimated length of stay: 3 - 4 days  Certification:: I certify this patient will need inpatient services for at least 2 midnights  PT Class (Do Not Modify): Inpatient [101]  PT Acc Code (Do Not Modify): Private [1]        Medical History Past Medical History:  Diagnosis Date  . Acute pulmonary edema (HCC)   . Anemia due to chronic kidney disease   . ARF (acute renal failure) ([place]) 10/19/2016  . Aspiration pneumonia ([place])   . Cerebellar infarct ([place]) 10/19/2016  . Chronic kidney disease, stage 3 (moderate) (HCC)   . CKD (chronic kidney disease) stage 2, GFR 60-89 ml/min 01/02/2017  . 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 Gastrostomy tube obstruction ([place])   . HCAP (healthcare-associated pneumonia)   . Helicobacter pylori infection 09/21/2012  . History of adenomatous polyp of colon 07/28/2015  . Hypertension   . MDR Acinetobacter baumannii infection   . Pneumothorax, closed, traumatic, initial encounter 12/10/2016  . Renal insufficiency   . Rhabdomyolysis 10/19/2016  . S/P percutaneous endoscopic gastrostomy (PEG) tube placement ([place]) 03/30/2017  . 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 Stroke ([place])   . UTI (urinary tract infection) 03/30/2017    Allergies No Known Allergies  IV Location/Drains/Wounds Patient Lines/Drains/Airways Status   Active Line/Drains/Airways    Name:   Placement date:   Placement time:   Site:   Days:   Peripheral IV 01/08/19 Right;Anterior Forearm   01/08/19    2202    Forearm   less than 1   Gastrostomy/Enterostomy Percutaneous endoscopic gastrostomy (PEG) LLQ   11/14/16    -    LLQ   785   Urethral Catheter Farrell Ours RN 16 Fr.   12/31/18    1837    -   8   Tracheostomy [place] 6 mm Uncuffed  01/01/19    1613    6 mm   7   Pressure Injury 04/15/18 Stage II -  Partial thickness loss of dermis presenting as a shallow open ulcer with a red, pink wound bed without slough.   04/15/18    1000     268   Wound / Incision (Open or Dehisced) small blister pustules on back arms and abdomen   -    -    -             Labs/Imaging Results for orders placed or performed during the hospital encounter of 01/08/19 (from the past 48  hour(s))  Urinalysis, Routine w reflex microscopic     Status: Abnormal   Collection Time: 01/08/19  6:54 PM  Result Value Ref Range   Color, Urine YELLOW YELLOW   APPearance HAZY (A) CLEAR   Specific Gravity, Urine 1.016 1.005 - 1.030   pH 6.0 5.0 - 8.0   Glucose, UA NEGATIVE NEGATIVE mg/dL   Hgb urine dipstick NEGATIVE NEGATIVE   Bilirubin Urine NEGATIVE NEGATIVE   Ketones, ur NEGATIVE NEGATIVE mg/dL   Protein, ur 100 (A) NEGATIVE mg/dL   Nitrite NEGATIVE NEGATIVE   Leukocytes,Ua LARGE (A) NEGATIVE   RBC / HPF 6-10 0 - 5 RBC/hpf   WBC, UA >50 (H) 0 - 5 WBC/hpf   Bacteria, UA RARE (A) NONE SEEN   Squamous Epithelial / LPF 0-5 0 - 5   Mucus PRESENT    Hyaline Casts, UA PRESENT     Comment: Performed at [facility], [place] [address], [place], [place] [address]  CBC with Differential     Status: Abnormal   Collection Time: 01/08/19  7:16 PM  Result Value Ref Range   WBC 13.7 (H) 4.0 - 10.5 K/uL   RBC 3.96 (L) 4.22 - 5.81 MIL/uL   Hemoglobin 11.7 (L) 13.0 - 17.0 g/dL   HCT 38.7 (L) 39.0 - 52.0 %   MCV 97.7 80.0 - 100.0 fL   MCH 29.5 26.0 - 34.0 pg   MCHC 30.2 30.0 - 36.0 g/dL   RDW 14.5 11.5 - 15.5 %   Platelets 390 150 - 400 K/uL   nRBC 0.1 0.0 - 0.2 %   Neutrophils Relative % 68 %   Neutro Abs 9.5 (H) 1.7 - 7.7 K/uL   Lymphocytes Relative 16 %   Lymphs Abs 2.2 0.7 - 4.0 K/uL   Monocytes Relative 11 %   Monocytes Absolute 1.5 (H) 0.1 - 1.0 K/uL   Eosinophils Relative 3 %   Eosinophils Absolute 0.4 0.0 - 0.5 K/uL   Basophils Relative 1 %   Basophils Absolute 0.1 0.0 - 0.1 K/uL   Immature Granulocytes 1 %   Abs Immature Granulocytes 0.07 0.00 - 0.07 K/uL    Comment: Performed at [facility], [place] [address] [place], [place] [address]  Comprehensive metabolic panel     Status: Abnormal   Collection Time: 01/08/19  7:16 PM  Result Value Ref Range   Sodium 137 135 - 145 mmol/L   Potassium 6.2 (H) 3.5 - 5.1 mmol/L   Chloride 101 98 -  111 mmol/L   CO2 27 22 - 32 mmol/L   Glucose, Bld 86 70 - 99 mg/dL   BUN 29 (H) 8 - 23 mg/dL   Creatinine, Ser 1.17 0.61 - 1.24 mg/dL   Calcium 9.4 8.9 - 10.3 mg/dL   Total Protein 7.6 6.5 - 8.1 g/dL   Albumin 3.2 (  L) 3.5 - 5.0 g/dL   AST 28 15 - 41 U/L   ALT 10 0 - 44 U/L   Alkaline Phosphatase 72 38 - 126 U/L   Total Bilirubin 0.9 0.3 - 1.2 mg/dL   GFR calc non Af Amer >60 >60 mL/min   GFR calc Af Amer >60 >60 mL/min   Anion gap 9 5 - 15    Comment: Performed at [facility], [place] [address] [place], [place] [address]  Lipase, blood     Status: None   Collection Time: 01/08/19  7:16 PM  Result Value Ref Range   Lipase 20 11 - 51 U/L    Comment: Performed at [facility], [place] [address] [place], [place] [address]  SARS Coronavirus 2 [facility] order, Performed in [facility] hospital lab) Nasopharyngeal Nasopharyngeal Swab     Status: None   Collection Time: 01/08/19  7:46 PM   Specimen: Nasopharyngeal Swab  Result Value Ref Range   SARS Coronavirus 2 NEGATIVE NEGATIVE    Comment: (NOTE) If result is NEGATIVE SARS-CoV-2 target nucleic acids are NOT DETECTED. The SARS-CoV-2 RNA is generally detectable in upper and lower  respiratory specimens during the acute phase of infection. The lowest  concentration of SARS-CoV-2 viral copies this assay can detect is 250  copies / mL. A negative result does not preclude SARS-CoV-2 infection  and should not be used as the sole basis for treatment or other  patient management decisions.  A negative result may occur with  improper specimen collection / handling, submission of specimen other  than nasopharyngeal swab, presence of viral mutation(s) within the  areas targeted by this assay, and inadequate number of viral copies  (<250 copies / mL). A negative result must be combined with clinical  observations, patient history, and epidemiological information. If result is POSITIVE SARS-CoV-2 target  nucleic acids are DETECTED. The SARS-CoV-2 RNA is generally detectable in upper and lower  respiratory specimens dur ing the acute phase of infection.  Positive  results are indicative of active infection with SARS-CoV-2.  Clinical  correlation with patient history and other diagnostic information is  necessary to determine patient infection status.  Positive results do  not rule out bacterial infection or co-infection with other viruses. If result is PRESUMPTIVE POSTIVE SARS-CoV-2 nucleic acids MAY BE PRESENT.   A presumptive positive result was obtained on the submitted specimen  and confirmed on repeat testing.  While 2019 novel coronavirus  (SARS-CoV-2) nucleic acids may be present in the submitted sample  additional confirmatory testing may be necessary for epidemiological  and / or clinical management purposes  to differentiate between  SARS-CoV-2 and other Sarbecovirus currently known to infect humans.  If clinically indicated additional testing with an alternate test  methodology [phone]) is advised. The SARS-CoV-2 RNA is generally  detectable in upper and lower respiratory sp ecimens during the acute  phase of infection. The expected result is Negative. Fact Sheet for Patients:  StrictlyIdeas.no Fact Sheet for Healthcare Providers: BankingDealers.co.za This test is not yet approved or cleared by the [place] FDA and has been authorized for detection and/or diagnosis of SARS-CoV-2 by FDA under an Emergency Use Authorization (EUA).  This EUA will remain in effect (meaning this test can be used) for the duration of the COVID-19 declaration under Section 564(b)(1) of the Act, 21 U.S.C. section 360bbb-3(b)(1), unless the authorization is terminated or revoked sooner. Performed at [facility], [place] [address] [place], [place] [address]  Dg Chest 2 View  Result Date: 01/08/2019 CLINICAL DATA:  Pt was  admitted from 9/15-9/17 with an upper GI bleed. He underwent an endoscopy on 9/16. Sister reported that she was notified that pt had "abnormal abdominal results" from the gastrologist. EXAM: CHEST - 2 VIEW COMPARISON:  Chest radiograph 01/01/2019, 12/31/2018 FINDINGS: Tracheostomy in place. Stable cardiomediastinal contours. Central vascular congestion with diffuse bilateral prominent interstitial markings. Low volume study. Slightly increased opacity at the medial right lung base suspicious for infection. Scattered atelectasis no pneumothorax or large pleural effusion. No acute finding in the visualized skeleton. IMPRESSION: 1. Mildly increased opacity at the medial right lung base suspicious for infection. 2. Central vascular congestion with probable trace edema and atelectasis. Electronically Signed   By: Audie Pinto M.D.   On: 01/08/2019 18:41    Pending Labs Unresulted Labs (From admission, onward)    Start     Ordered   01/08/19 2015  Potassium  ONCE - STAT,   STAT     01/08/19 2014   01/08/19 1945  Urine culture  ONCE - STAT,   STAT     01/08/19 1944   01/08/19 1945  Blood culture (routine x 2)  BLOOD CULTURE X 2,   STAT     01/08/19 1944   Signed and Held  Culture, sputum-assessment  Once,   R    Question:  Patient immune status  Answer:  Immunocompromised   Signed and Held   Signed and Held  Strep pneumoniae urinary antigen  Once,   R     Signed and Held   Signed and Held  Legionella Pneumophila Serogp 1 Ur Ag  Once,   R     Signed and Held   Signed and Held  Hemoglobin A1c  Once,   R    Comments: To assess prior glycemic control    Signed and Held   Signed and Held  Magnesium  Tomorrow morning,   R    Comments: Call MD if <1.5    Signed and Held   Signed and Held  Phosphorus  Tomorrow morning,   R     Signed and Held   Signed and Held  TSH  Once,   R    Comments: Cancel if already done within 1 month and notify MD    Signed and Held   Signed and Held  Comprehensive  metabolic panel  Once,   R    Comments: Cal MD for K<3.5 or >5.0    Signed and Held   Signed and Held  CBC  Once,   R    Comments: Call for hg <8.0    Signed and Held          Vitals/Pain Today's Vitals   01/08/19 2200 01/08/19 2230 01/08/19 2300 01/08/19 2315  BP: 122/67 (!) 159/82 139/71   Pulse: 84 90 93   Resp: 18 13 19    Temp:      TempSrc:      SpO2: 93% 91% 95%   PainSc:    0-No pain    Isolation Precautions No active isolations  Medications Medications  vancomycin (VANCOCIN) 1,500 mg in sodium chloride 0.9 % 500 mL IVPB (has no administration in time range)  ceFEPIme (MAXIPIME) 2 g in sodium chloride 0.9 % 100 mL IVPB (2 g Intravenous New Bag/Given (Non-Interop) 01/08/19 2301)  insulin aspart (novoLOG) injection 5 Units (5 Units Intravenous Given 01/08/19 2252)    And  dextrose 50 % solution 50  mL (50 mLs Intravenous Given 01/08/19 2251)  pantoprazole (PROTONIX) injection 40 mg (40 mg Intravenous Given 01/08/19 2253)    Mobility walks with device

## 2019-01-08 NOTE — Progress Notes (Signed)
A consult was received from an ED physician for vancomycin per pharmacy dosing.  The patient's profile has been reviewed for ht/wt/allergies/indication/available labs.   A one time order has been placed for vancomycin 1500 mg IV x 1 dose.    Further antibiotics/pharmacy consults should be ordered by admitting physician if indicated.                       Thank you,  Eudelia Bunch, Pharm.D 01/08/2019 7:55 PM

## 2019-01-08 NOTE — ED Provider Notes (Signed)
[facility] DEPT Provider Note   CSN: [id number] Arrival date & time: 01/08/19  1659     History   Chief Complaint Chief Complaint  Patient presents with  . Emesis    HPI Scott Rivera is a 68 y.o. male with history of CVA, CKD, H. pylori, gastrostomy tube, tracheostomy presents today by EMS.  On initial evaluation patient well-appearing and in no acute distress.  Reports emesis, denies pain.  Unable to obtain further information as patient is nonverbal.  He follows commands and is able to communicate with nodding and shaking his head.   Submental history obtained from Scott Rivera, patient's sister: [phone] -Patient was in normal state of health until yesterday, she noticed a foam-like secretion coming from his tracheostomy, this apparently did not bother the patient.  However this morning patient developed dark brown emesis, unimproved with Zofran.  Shortly after she received call from his gastroenterologist reporting they found "something" on his recent gastric biopsy she cannot recall what this is.  When nausea and vomiting continued EMS was called.  She denies patient with history of fever/chills, complaints of pain or any additional concerns.    HPI  Past Medical History:  Diagnosis Date  . Acute pulmonary edema (HCC)   . Anemia due to chronic kidney disease   . ARF (acute renal failure) ([place]) 10/19/2016  . Aspiration pneumonia ([place])   . Cerebellar infarct ([place]) 10/19/2016  . Chronic kidney disease, stage 3 (moderate) (HCC)   . CKD (chronic kidney disease) stage 2, GFR 60-89 ml/min 01/02/2017  . 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 Gastrostomy tube obstruction ([place])   . HCAP (healthcare-associated pneumonia)   . Helicobacter pylori infection 09/21/2012  . History of adenomatous polyp of colon 07/28/2015  . Hypertension   . MDR Acinetobacter baumannii infection   . Pneumothorax, closed, traumatic, initial  encounter 12/10/2016  . Renal insufficiency   . Rhabdomyolysis 10/19/2016  . S/P percutaneous endoscopic gastrostomy (PEG) tube placement ([place]) 03/30/2017  . Stage III pressure ulcer of sacral region ([place]) 12/17/2016  . Stroke ([place])   . UTI (urinary tract infection) 03/30/2017    Patient Active Problem List   Diagnosis Date Noted  . HCAP (healthcare-associated pneumonia) 01/08/2019  . Aspiration pneumonia ([place]) 01/01/2019  . GI bleed 12/31/2018  . Pneumoperitoneum 04/09/2018  . Perforated viscus 04/09/2018  . 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 Aspiration of gastric contents   . Malnutrition of moderate degree 11/19/2017  . Aspiration pneumonia of right lower lobe ([place]) 11/16/2017  . Nausea and vomiting 11/16/2017  . Tracheostomy tube present ([place]) 09/25/2017  . Chronic respiratory insufficiency 07/24/2017  . Hematuria 06/26/2017  . Foley catheter in place on admission 03/30/2017  . Diabetes mellitus ([place]) 03/14/2017  . History of tracheostomy 03/14/2017  . Percutaneous endoscopic gastrostomy status ([place]) 03/14/2017  . Gastroesophageal reflux disease without esophagitis 03/14/2017  . Acute respiratory failure with hypoxia ([place]) 03/02/2017  . Loculated pleural effusion   . CKD (chronic kidney disease) stage 2, GFR 60-89 ml/min 01/02/2017  . Anemia due to chronic kidney disease 12/09/2016  . Compensated metabolic alkalosis [id number]  . Goals of care, counseling/discussion   . Palliative care by specialist   . Aspiration into airway   . Dysphagia, post-stroke   . Tachypnea   . Leukocytosis   . Acute blood loss anemia   . Pressure injury of skin 10/20/2016  . Essential hypertension 10/19/2016  .  History of cerebral infarction 10/19/2016  . Bilateral chronic knee pain 01/07/2016  . Tobacco abuse 12/23/2014  . Mixed hyperlipidemia 07/28/2013    Past Surgical History:  Procedure Laterality Date  . BIOPSY  01/01/2019   Procedure:  BIOPSY;  Surgeon: Irving Copas., MD;  Location: [facility];  Service: Gastroenterology;;  . ESOPHAGOGASTRODUODENOSCOPY (EGD) WITH PROPOFOL N/A 01/01/2019   Procedure: ESOPHAGOGASTRODUODENOSCOPY (EGD) WITH PROPOFOL;  Surgeon: Irving Copas., MD;  Location: [facility];  Service: Gastroenterology;  Laterality: N/A;  . IR GASTROSTOMY TUBE MOD SED  10/27/2016  . IR PATIENT EVAL TECH 0-60 MINS  01/05/2017  . IR PATIENT EVAL TECH 0-60 MINS  01/09/2017  . IR PATIENT EVAL TECH 0-60 MINS  03/03/2017  . IR PATIENT EVAL TECH 0-60 MINS  04/03/2017  . IR REPLACE G-TUBE SIMPLE WO FLUORO  11/20/2017  . IR [place] TUBE PERCUT W/FLUORO  07/23/2017  . KNEE SURGERY    . TRACHEOSTOMY TUBE PLACEMENT          Home Medications    Prior to Admission medications   Medication Sig Start Date End Date Taking? Authorizing Provider  acetaminophen (TYLENOL) 500 MG tablet Place 1,000 mg into feeding tube 3 (three) times daily.     [provider]  amLODipine (NORVASC) 10 MG tablet Place 1 tablet (10 mg total) into feeding tube daily. 11/23/17   Kayleen Memos, DO  cloNIDine (CATAPRES - DOSED IN MG/24 HR) 0.1 mg/24hr patch Place 1 patch (0.1 mg total) onto the skin every Tuesday. 04/16/18   Aline August, MD  clopidogrel (PLAVIX) 75 MG tablet Place 1 tablet (75 mg total) into feeding tube daily. 01/04/19   Mariel Aloe, MD  Ferrous Sulfate (IRON) 325 (65 Fe) MG TABS Place 325 mg into feeding tube daily.     [provider]  gabapentin (NEURONTIN) 100 MG capsule 100 mg See admin instructions. Open 1 capsule (100 mg) and administer contents via feeding tube three times daily    [provider]  glycopyrrolate (ROBINUL) 1 MG tablet Place 1 mg into feeding tube 2 (two) times daily.  11/18/18   [provider]  metoprolol tartrate (LOPRESSOR) 50 MG tablet Take 1 tablet (50 mg total) by mouth 2 (two) times daily. 12/21/17   Thurnell Lose, MD  mirtazapine  (REMERON) 15 MG tablet Take 7.5 mg by mouth at bedtime. 12/24/18   [provider]  montelukast (SINGULAIR) 10 MG tablet Place 10 mg into feeding tube daily.     [provider]  Nutritional Supplements (FEEDING SUPPLEMENT, JEVITY 1.2 CAL,) LIQD Initiate at 35 ml/hr and increase by 10 ml every 8 hours to reach goal rate of 65 ml/hr. 01/02/19   Mariel Aloe, MD  ondansetron (ZOFRAN) 4 MG tablet Place 1 tablet (4 mg total) into feeding tube every 6 (six) hours as needed for nausea. 11/24/18   Lucrezia Starch, MD  pantoprazole sodium (PROTONIX) 40 mg/20 mL PACK Place 20 mLs (40 mg total) into feeding tube 2 (two) times daily. 01/02/19   Mariel Aloe, MD  pravastatin (PRAVACHOL) 20 MG tablet Place 1 tablet (20 mg total) into feeding tube daily at 6 PM. Patient taking differently: Place 20 mg daily into feeding tube.  01/10/17   Cherene Altes, MD  scopolamine (TRANSDERM-SCOP) 1 MG/3DAYS Place 1 mg onto the skin every 3 (three) days. 12/30/18   [provider]  thiamine 100 MG tablet Place 1 tablet (100 mg total) into feeding tube  daily. Patient not taking: Reported on 11/19/2018 01/11/17   Cherene Altes, MD  vitamin C (ASCORBIC ACID) 500 MG tablet Take 500 mg by mouth daily.    [provider]  Water For Irrigation, Sterile (FREE WATER) SOLN Place 100 mLs into feeding tube every 3 (three) hours. Patient not taking: Reported on 11/19/2018 04/16/18   Aline August, MD    Family History Family History  Problem Relation Age of Onset  . Diabetes Mellitus II Brother   . CAD Neg Hx   . Stroke Neg Hx     Social History Social History   Tobacco Use  . Smoking status: Former Research scientist (life sciences)  . Smokeless tobacco: Never Used  . Tobacco comment: Smoked heavily until the day of his stroke  Substance Use Topics  . Alcohol use: Not Currently    Comment: Drank heavily prior to stroke per sister  . Drug use: Not Currently     Allergies   Patient has no known  allergies.   Review of Systems Review of Systems Ten systems are reviewed and are negative for acute change except as noted in the HPI   Physical Exam Updated Vital Signs BP (!) 141/70   Pulse 86   Temp 97.8 F (36.6 C) (Oral)   Resp 18   SpO2 95%   Physical Exam Constitutional:      General: He is not in acute distress.    Appearance: Normal appearance. He is well-developed. He is not ill-appearing or diaphoretic.  HENT:     Head: Normocephalic and atraumatic.     Right Ear: External ear normal.     Left Ear: External ear normal.     Nose: Nose normal.  Eyes:     General: Vision grossly intact. Gaze aligned appropriately.     Pupils: Pupils are equal, round, and reactive to light.  Neck:     Musculoskeletal: Normal range of motion.     Trachea: Trachea and phonation normal. No tracheal deviation.  Pulmonary:     Effort: Pulmonary effort is normal. No respiratory distress.  Abdominal:     General: There is no distension.     Palpations: Abdomen is soft.     Tenderness: There is no abdominal tenderness. There is no guarding or rebound.  Musculoskeletal: Normal range of motion.  Skin:    General: Skin is warm and dry.  Neurological:     Mental Status: He is alert.     GCS: GCS eye subscore is 4. GCS verbal subscore is 5. GCS motor subscore is 6.     Comments: Major Cranial nerves without deficit, no facial droop Moves extremities without ataxia, coordination intact  Psychiatric:        Behavior: Behavior normal.      ED Treatments / Results  Labs (all labs ordered are listed, but only abnormal results are displayed) Labs Reviewed  CBC WITH DIFFERENTIAL/PLATELET - Abnormal; Notable for the following components:      Result Value   WBC 13.7 (*)    RBC 3.96 (*)    Hemoglobin 11.7 (*)    HCT 38.7 (*)    Neutro Abs 9.5 (*)    Monocytes Absolute 1.5 (*)    All other components within normal limits  COMPREHENSIVE METABOLIC PANEL - Abnormal; Notable for the  following components:   Potassium 6.2 (*)    BUN 29 (*)    Albumin 3.2 (*)    All other components within normal limits  URINALYSIS, ROUTINE W REFLEX  MICROSCOPIC - Abnormal; Notable for the following components:   APPearance HAZY (*)    Protein, ur 100 (*)    Leukocytes,Ua LARGE (*)    WBC, UA >50 (*)    Bacteria, UA RARE (*)    All other components within normal limits  URINE CULTURE  CULTURE, BLOOD (ROUTINE X 2)  CULTURE, BLOOD (ROUTINE X 2)  SARS CORONAVIRUS 2 (HOSPITAL ORDER, [facility])  LIPASE, BLOOD  POTASSIUM    EKG None  Radiology Dg Chest 2 View  Result Date: 01/08/2019 CLINICAL DATA:  Pt was admitted from 9/15-9/17 with an upper GI bleed. He underwent an endoscopy on 9/16. Sister reported that she was notified that pt had "abnormal abdominal results" from the gastrologist. EXAM: CHEST - 2 VIEW COMPARISON:  Chest radiograph 01/01/2019, 12/31/2018 FINDINGS: Tracheostomy in place. Stable cardiomediastinal contours. Central vascular congestion with diffuse bilateral prominent interstitial markings. Low volume study. Slightly increased opacity at the medial right lung base suspicious for infection. Scattered atelectasis no pneumothorax or large pleural effusion. No acute finding in the visualized skeleton. IMPRESSION: 1. Mildly increased opacity at the medial right lung base suspicious for infection. 2. Central vascular congestion with probable trace edema and atelectasis. Electronically Signed   By: Audie Pinto M.D.   On: 01/08/2019 18:41    Procedures Procedures (including critical care time)  Medications Ordered in ED Medications  vancomycin (VANCOCIN) 1,500 mg in sodium chloride 0.9 % 500 mL IVPB (has no administration in time range)  ceFEPIme (MAXIPIME) 2 g in sodium chloride 0.9 % 100 mL IVPB (has no administration in time range)  insulin aspart (novoLOG) injection 5 Units (has no administration in time range)    And  dextrose 50 %  solution 50 mL (has no administration in time range)     Initial Impression / Assessment and Plan / ED Course  I have reviewed the triage vital signs and the nursing notes.  Pertinent labs & imaging results that were available during my care of the patient were reviewed by me and considered in my medical decision making (see chart for details).  Clinical Course as of Jan 08 2108  Wed Jan 08, 2019  1722 [phone] Scott Rivera; no answer   [BM]  1725 Endoscopy report 01/01/2019 - LA Grade D esophagitis. Biopsied. - Small hiatal hernia. - Non-bleeding erosive gastropathy. Erythematous mucosa in the gastric body. - Intact gastrostomy with a patent G-tube present characterized by ulceration. - Duodenal erosions without bleeding. - Normal major papilla. 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 Biopsied.   [BM]  1816 Endoscopy notes report. Notes recorded by Timothy Lasso, RN on 01/08/2019 at 9:59 AM EDT  I spoke with the pt's POA and she has been advised and states she would like for the result sent to the PCP for treatment options as needed.  ------   Notes recorded by Mansouraty, Telford Nab., MD on 01/08/2019 at 5:28 AM EDT  Patty,  The patient's H. pylori serology returned showing evidence of positivity in IgG.  This would be suggestive of at some point in time having had H. pylori or potentially having H. pylori currently.  Unless this patient is going to follow-up in our clinic, I would not proceed with treating H. pylori.  This can be followed up by his primary care provider as well as his primary GI team at [facility].  However if they are going to follow-up with [place] in clinic then we  can determine consideration of treatment.  Depending on what they decide as per my letter with biopsy results please let me know.  Thanks.  GM   [BM]  1856 CXR:  IMPRESSION: 1. Mildly increased opacity at the medial right lung base suspicious for infection. 2.  Central vascular congestion with probable trace edema and atelectasis.   Discussed with Dr. Roslynn Amble, will await bloodwork prior to abx.   [BM]  2010 CBC shows leukocytosis of 13.7 with left shift.  CMP shows hyperkalemia.  Urinalysis shows UTI.  Urine culture and blood cultures added.  Will begin patient on cefepime and vancomycin for possible hospital acquired pneumonia, will allow hospital team to de-escalate, this will additionally cover for UTI.  As to hyperkalemia unsure as to etiology at this time will begin patient on insulin/dextrose and obtain EKG to see if more aggressive treatment indicated.   [BM]  2018 Consult called to hospitalist Dr. Roel Cluck will be seeing patient for admission.   [BM]  2108 Reassessment patient resting comfortably no acute distress.  Patient has been admitted to hospital service for further evaluation management.   [BM]    Clinical Course User Index [BM] Deliah Boston, PA-C    Patient seen and evaluated by Dr. Roslynn Amble during this visit.  Note: Portions of this report may have been transcribed using voice recognition software. Every effort was made to ensure accuracy; however, inadvertent computerized transcription errors may still be present. Final Clinical Impressions(s) / ED Diagnoses   Final diagnoses:  Healthcare-associated pneumonia  Urinary tract infection with hematuria, site unspecified  Hyperkalemia    ED Discharge Orders    None       Gari Crown 01/08/19 2110    Lucrezia Starch, MD 01/09/19 [phone]

## 2019-01-08 NOTE — ED Notes (Signed)
x2 unsuccessful IV attempts

## 2019-01-08 NOTE — ED Notes (Signed)
Patient transported to X-ray 

## 2019-01-08 NOTE — ED Triage Notes (Signed)
Pt was admitted from 9/15-9/17 with an upper GI bleed. He underwent an endoscopy on 9/16. Sister reported that she was notified that pt had "abnormal abdominal results" from the gastrologist. Pt has also been having frequent episodes of emesis uncontrolled with antiemetic. Pt is a&ox4 but nonverbal. Pt is from home.

## 2019-01-08 NOTE — ED Notes (Signed)
Lab at bedside for blood work.

## 2019-01-08 NOTE — Telephone Encounter (Signed)
The pt's wife called and states the pt has been vomiting "coffee ground looking stuff"  She says he has been doing this for the past 20 minutes.  She was advised that the pt needs to go to the ED for evaluation.  The pt wife agreed and had already called 911.

## 2019-01-08 NOTE — H&P (Signed)
Scott Rivera [id number] DOB: [birth year] DOA: 01/08/2019     PCP: [facility] [place]   Outpatient Specialists:     GI* Dr.mansouraty  (LB ) seen patient in the hospital otherwise he is followed by [facility]    Patient arrived to ER on 01/08/19 at [facility]  Patient coming from: home Lives   With family    Chief Complaint:   Chief Complaint  Patient presents with  . Emesis    HPI: Scott Rivera is a 68 y.o. male with medical history significant of CVA sp trach, G-tube dependent,  Chronic Indwelling Foley Catheter, decubitus ulcers,  HLD, HTN,     Presented with  Nausea and vomiting somewhat brown in color similar to prior episodes Sister says he has been foaming from his trach. Sister who is been taking care of patient full-time attempted to use Zofran but did not seem to help He was notified by gastroenterology that biopsy was obtained during prior admission and showed  H pylori in IgG plan was to follow-up with his primary GI at [facility]  He os on 2 L chronically  Patient had history of admission in December for ground coffee vomiting Then again in September he had an episode where he had bright red blood vomiting Was admitted he was just discharged on 17 September Patient is on Plavix which was held during prior admission GI has seen him and upper endoscopy was done showed 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 obtained Plavix was resumed in 48 hours  Recently was diagnosed with aspiration pneumonia initially started Zosyn and improved Is able to be discharged on [place] has seen him in consult during prior admission recommended cuffless trach at the time of discharge In his stay patient has been somewhat uncooperative and refusing care  Infectious risk factors:  Reports shortness of breath,  cough,   In  ER RAPID COVID TEST NEGATIVE   Lab Results  Component Value Date   [place] 01/08/2019   [place] NEGATIVE  12/31/2018     Regarding pertinent Chronic problems:     Hyperlipidemia -  on statins Pravachol   HTN on Norvasc, Clonidine, lopressor       CAD  - On Aspirin, statin, betablocker, Plavix                    Hx of CVA -  With/ t residual deficits on Aspirin 81  Plavix    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 Urine culture  . Blood culture (routine x 2)  . SARS Coronavirus [facility] order, Performed in [facility] hospital lab) Nasopharyngeal Nasopharyngeal Swab  . DG Chest 2 View  . CBC with Differential  . Comprehensive metabolic panel  . Lipase, blood  . Urinalysis, Routine w reflex microscopic  . Cardiac monitoring  . Place Patient on a Cardiac Monitor  . Initiate Carrier Fluid Protocol  . Consult to hospitalist  ALL PATIENTS BEING ADMITTED/HAVING PROCEDURES NEED COVID-19 SCREENING  . Airborne and Contact precautions  . Pulse oximetry, continuous  .  ED EKG  . Insert peripheral IV     Following Medications were ordered in ER: Medications  vancomycin (VANCOCIN) 1,500 mg in sodium chloride 0.9 % 500 mL IVPB (has no administration in time range)  ceFEPIme (MAXIPIME) 2 g in sodium chloride 0.9 % 100 mL IVPB (has no administration in time range)  insulin aspart (novoLOG) injection 5 Units (has no administration in time range)    And  dextrose 50 % solution 50 mL (has no administration in time range)        Consult Orders  (From admission, onward)         Start     Ordered   01/08/19 2006  Consult to hospitalist  ALL PATIENTS BEING ADMITTED/HAVING PROCEDURES NEED COVID-19 SCREENING  Once    Comments: ALL PATIENTS  BEING ADMITTED/HAVING PROCEDURES NEED COVID-19 SCREENING  Provider:  (Not yet assigned)  Question Answer Comment  Place call to: [facility]   Reason for Consult Admit      01/08/19 2005            Significant initial  Findings: Abnormal Labs Reviewed  CBC WITH DIFFERENTIAL/PLATELET - Abnormal; Notable for the following components:      Result Value   WBC 13.7 (*)    RBC 3.96 (*)    Hemoglobin 11.7 (*)    HCT 38.7 (*)    Neutro Abs 9.5 (*)    Monocytes Absolute 1.5 (*)    All other components within normal limits  COMPREHENSIVE METABOLIC PANEL - Abnormal; Notable for the following components:   Potassium 6.2 (*)    BUN 29 (*)    Albumin 3.2 (*)    All other components within normal limits  URINALYSIS, ROUTINE W REFLEX MICROSCOPIC - Abnormal; Notable for the following components:   APPearance HAZY (*)    Protein, ur 100 (*)    Leukocytes,Ua LARGE (*)    WBC, UA >50 (*)    Bacteria, UA RARE (*)    All other components within normal limits    Otherwise labs showing:    Recent Labs  Lab 01/08/19 1916  NA 137  K 6.2*  CO2 27  GLUCOSE 86  BUN 29*  CREATININE 1.17  CALCIUM 9.4    Cr    Up from baseline see below Lab Results  Component Value Date   CREATININE 1.17 01/08/2019   CREATININE 0.98 01/01/2019   CREATININE 1.00 12/31/2018    Recent Labs  Lab 01/08/19 1916  AST 28  ALT 10  ALKPHOS 72  BILITOT 0.9  PROT 7.6  ALBUMIN 3.2*   Lab Results  Component Value Date   CALCIUM 9.4 01/08/2019   PHOS 2.2 (L) 12/21/2017    WBC      Component Value Date/Time   WBC 13.7 (H) 01/08/2019 1916   ANC    Component Value Date/Time   NEUTROABS 9.5 (H) 01/08/2019 1916     Plt: Lab Results  Component Value Date   PLT 390 01/08/2019         COVID-19 Labs  No results for input(s): DDIMER, FERRITIN, LDH, CRP in the last 72 hours.  Lab Results  Component Value Date   SARSCOV2NAA NEGATIVE 12/31/2018       HG/HCT stable,       Component  Value Date/Time   HGB 11.7 (L) 01/08/2019 1916   HCT 38.7 (L) 01/08/2019 1916    Recent Labs  Lab 01/08/19 1916  LIPASE 20  ECG: Ordered    UA  Possible UTI   Urine analysis:    Component Value Date/Time   COLORURINE YELLOW 01/08/2019 1854   APPEARANCEUR HAZY (A) 01/08/2019 1854   LABSPEC 1.016 01/08/2019 1854   PHURINE 6.0 01/08/2019 1854   GLUCOSEU NEGATIVE 01/08/2019 1854   HGBUR NEGATIVE 01/08/2019 [place] 01/08/2019 [place] 01/08/2019 1854   PROTEINUR 100 (A) 01/08/2019 1854   NITRITE NEGATIVE 01/08/2019 1854   LEUKOCYTESUR LARGE (A) 01/08/2019 1854     Ordered     CXR -  Mildly increased opacity at the medial right lung base suspicious for infection     ED Triage Vitals  Enc Vitals Group     BP 01/08/19 1717 126/84     Pulse Rate 01/08/19 1717 83     Resp 01/08/19 1717 18     Temp 01/08/19 1717 97.8 F (36.6 C)     Temp Source 01/08/19 1717 Oral     SpO2 01/08/19 1716 92 %     Weight --      Height --      Head Circumference --      Peak Flow --      Pain Score --      Pain Loc --      Pain Edu? --      Excl. in [place]? --   TMAX(24)@       Latest  Blood pressure 135/85, pulse 84, temperature 97.8 F (36.6 C), temperature source Oral, resp. rate 16, SpO2 96 %.    Hospitalist was called for admission for recurrent aspiration pneumonia, questionable UTI, coffee-ground emesis   Review of Systems:    Pertinent positives include: emesis, cough  Constitutional:  No weight loss, night sweats, Fevers, chills, fatigue, weight loss  HEENT:  No headaches, Difficulty swallowing,Tooth/dental problems,Sore throat,  No sneezing, itching, ear ache, nasal congestion, post nasal drip,  Cardio-vascular:  No chest pain, Orthopnea, PND, anasarca, dizziness, palpitations.no Bilateral lower extremity swelling  GI:  No heartburn, indigestion, abdominal pain, nausea, vomiting, diarrhea, change in bowel habits, loss of  appetite, melena, blood in stool, hematemesis Resp:  no shortness of breath at rest. No dyspnea on exertion, No excess mucus, no productive cough, No non-productive cough, No coughing up of blood.No change in color of mucus.No wheezing. Skin:  no rash or lesions. No jaundice GU:  no dysuria, change in color of urine, no urgency or frequency. No straining to urinate.  No flank pain.  Musculoskeletal:  No joint pain or no joint swelling. No decreased range of motion. No back pain.  Psych:  No change in mood or affect. No depression or anxiety. No memory loss.  Neuro: no localizing neurological complaints, no tingling, no weakness, no double vision, no gait abnormality, no slurred speech, no confusion  All systems reviewed and apart from [place] all are negative  Past Medical History:   Past Medical History:  Diagnosis Date  . Acute pulmonary edema (HCC)   . Anemia due to chronic kidney disease   . ARF (acute renal failure) ([place]) 10/19/2016  . Aspiration pneumonia ([place])   . Cerebellar infarct ([place]) 10/19/2016  . Chronic kidney disease, stage 3 (moderate) (HCC)   . CKD (chronic kidney disease) stage 2, GFR 60-89 ml/min 01/02/2017  . 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 Gastrostomy tube obstruction ([place])   . HCAP (healthcare-associated pneumonia)   . Helicobacter pylori infection 09/21/2012  . History of  adenomatous polyp of colon 07/28/2015  . Hypertension   . MDR Acinetobacter baumannii infection   . Pneumothorax, closed, traumatic, initial encounter 12/10/2016  . Renal insufficiency   . Rhabdomyolysis 10/19/2016  . S/P percutaneous endoscopic gastrostomy (PEG) tube placement ([place]) 03/30/2017  . Stage III pressure ulcer of sacral region ([place]) 12/17/2016  . Stroke ([place])   . UTI (urinary tract infection) 03/30/2017     Past Surgical History:  Procedure Laterality Date  . BIOPSY  01/01/2019   Procedure: BIOPSY;  Surgeon: Irving Copas., MD;  Location: [facility];  Service: Gastroenterology;;  . ESOPHAGOGASTRODUODENOSCOPY (EGD) WITH PROPOFOL N/A 01/01/2019   Procedure: ESOPHAGOGASTRODUODENOSCOPY (EGD) WITH PROPOFOL;  Surgeon: Irving Copas., MD;  Location: [facility];  Service: Gastroenterology;  Laterality: N/A;  . IR GASTROSTOMY TUBE MOD SED  10/27/2016  . IR PATIENT EVAL TECH 0-60 MINS  01/05/2017  . IR PATIENT EVAL TECH 0-60 MINS  01/09/2017  . IR PATIENT EVAL TECH 0-60 MINS  03/03/2017  . IR PATIENT EVAL TECH 0-60 MINS  04/03/2017  . IR REPLACE G-TUBE SIMPLE WO FLUORO  11/20/2017  . IR [place] TUBE PERCUT W/FLUORO  07/23/2017  . KNEE SURGERY    . TRACHEOSTOMY TUBE PLACEMENT      Social History:  Ambulatory  Bed bound    reports that he has quit smoking. He has never used smokeless tobacco. He reports previous alcohol use. He reports previous drug use.    Family History:   Family History  Problem Relation Age of Onset  . Diabetes Mellitus II Brother   . CAD Neg Hx   . Stroke Neg Hx     Allergies: No Known Allergies   Prior to Admission medications   Medication Sig Start Date End Date Taking? Authorizing Provider  acetaminophen (TYLENOL) 500 MG tablet Place 1,000 mg into feeding tube 3 (three) times daily.     [provider]  amLODipine (NORVASC) 10 MG tablet Place 1 tablet (10 mg total) into feeding tube daily. 11/23/17   Kayleen Memos, DO  cloNIDine (CATAPRES - DOSED IN MG/24 HR) 0.1 mg/24hr patch Place 1 patch (0.1 mg total) onto the skin every Tuesday. 04/16/18   Aline August, MD  clopidogrel (PLAVIX) 75 MG tablet Place 1 tablet (75 mg total) into feeding tube daily. 01/04/19   Mariel Aloe, MD  Ferrous Sulfate (IRON) 325 (65 Fe) MG TABS Place 325 mg into feeding tube daily.     [provider]  gabapentin (NEURONTIN) 100 MG capsule 100 mg See admin instructions. Open 1 capsule (100 mg) and administer contents via feeding tube three times daily    [provider]   glycopyrrolate (ROBINUL) 1 MG tablet Place 1 mg into feeding tube 2 (two) times daily.  11/18/18   [provider]  metoprolol tartrate (LOPRESSOR) 50 MG tablet Take 1 tablet (50 mg total) by mouth 2 (two) times daily. 12/21/17   Thurnell Lose, MD  mirtazapine (REMERON) 15 MG tablet Take 7.5 mg by mouth at bedtime. 12/24/18   [provider]  montelukast (SINGULAIR) 10 MG tablet Place 10 mg into feeding tube daily.     [provider]  Nutritional Supplements (FEEDING SUPPLEMENT, JEVITY 1.2 CAL,) LIQD Initiate at 35 ml/hr and increase by 10 ml every 8 hours to reach goal rate of 65 ml/hr. 01/02/19   Mariel Aloe, MD  ondansetron (ZOFRAN) 4 MG tablet Place 1 tablet (4 mg total) into feeding tube every 6 (six)  hours as needed for nausea. 11/24/18   Lucrezia Starch, MD  pantoprazole sodium (PROTONIX) 40 mg/20 mL PACK Place 20 mLs (40 mg total) into feeding tube 2 (two) times daily. 01/02/19   Mariel Aloe, MD  pravastatin (PRAVACHOL) 20 MG tablet Place 1 tablet (20 mg total) into feeding tube daily at 6 PM. Patient taking differently: Place 20 mg daily into feeding tube.  01/10/17   Cherene Altes, MD  scopolamine (TRANSDERM-SCOP) 1 MG/3DAYS Place 1 mg onto the skin every 3 (three) days. 12/30/18   [provider]  thiamine 100 MG tablet Place 1 tablet (100 mg total) into feeding tube daily. Patient not taking: Reported on 11/19/2018 01/11/17   Cherene Altes, MD  vitamin C (ASCORBIC ACID) 500 MG tablet Take 500 mg by mouth daily.    [provider]  Water For Irrigation, Sterile (FREE WATER) SOLN Place 100 mLs into feeding tube every 3 (three) hours. Patient not taking: Reported on 11/19/2018 04/16/18   Aline August, MD   Physical Exam: Blood pressure 135/85, pulse 84, temperature 97.8 F (36.6 C), temperature source Oral, resp. rate 16, SpO2 96 %. 1. General:  in No  Acute distress   Chronically ill  -appearing sp trach and G- tube 2.  Psychological: Alert and  Oriented to self  3. Head/ENT:    Dry Mucous Membranes                          Head Non traumatic, neck supple                            Poor Dentition 4. SKIN:  decreased Skin turgor,  Skin clean Dry and intact no rash 5. Heart: Regular rate and rhythm no Murmur, no Rub or gallop 6. Lungs:  no wheezes or crackles   7. Abdomen: Soft,  non-tender, Non distended  bowel sounds present 8. Lower extremities: no clubbing, cyanosis, trace edema 9. Neurologically  Diffuse weakness, non cooperative with exam 10. MSK: diminished  range of motion in lower ext. Large bony changes over the knees bilaterally L >R   All other LABS:     Recent Labs  Lab 01/02/19 0208 01/08/19 1916  WBC 10.3 13.7*  NEUTROABS  --  9.5*  HGB 10.8* 11.7*  HCT 35.5* 38.7*  MCV 96.5 97.7  PLT 187 390     Recent Labs  Lab 01/08/19 1916  NA 137  K 6.2*  CL 101  CO2 27  GLUCOSE 86  BUN 29*  CREATININE 1.17  CALCIUM 9.4     Recent Labs  Lab 01/08/19 1916  AST 28  ALT 10  ALKPHOS 72  BILITOT 0.9  PROT 7.6  ALBUMIN 3.2*       Cultures:    Component Value Date/Time   SDES SPU 12/31/2018 1756   SDES  12/31/2018 1756    SPU Performed at [facility], [place] [address] [place], [place] [address]    [place] 12/31/2018 1756   SPECREQUEST  12/31/2018 1756    NONE Reflexed from [id number] Performed at [facility], [place] [address], [place], [place] [address]    CULT  12/31/2018 1756    FEW PSEUDOMONAS AERUGINOSA FEW KLEBSIELLA PNEUMONIAE    REPTSTATUS 12/31/2018 FINAL 12/31/2018 1756   REPTSTATUS 01/04/2019 FINAL 12/31/2018 1756     Radiological Exams on Admission: Dg Chest 2 View  Result Date: 01/08/2019 CLINICAL DATA:  Pt was admitted from 9/15-9/17 with an upper GI bleed. He underwent an endoscopy on 9/16. Sister reported that she was notified that pt had "abnormal abdominal results" from the gastrologist. EXAM: CHEST - 2  VIEW COMPARISON:  Chest radiograph 01/01/2019, 12/31/2018 FINDINGS: Tracheostomy in place. Stable cardiomediastinal contours. Central vascular congestion with diffuse bilateral prominent interstitial markings. Low volume study. Slightly increased opacity at the medial right lung base suspicious for infection. Scattered atelectasis no pneumothorax or large pleural effusion. No acute finding in the visualized skeleton. IMPRESSION: 1. Mildly increased opacity at the medial right lung base suspicious for infection. 2. Central vascular congestion with probable trace edema and atelectasis. Electronically Signed   By: Audie Pinto M.D.   On: 01/08/2019 18:41    Chart has been reviewed    Assessment/Plan   68 y.o. male with medical history significant of CVA sp trach, G-tube dependent,  Chronic Indwelling Foley Catheter, decubitus ulcers,  HLD, HTN,     Admitted for recurrent aspiration pneumonia, questionable UTI, coffee-ground emesis   Present on Admission: . HCAP (healthcare-associated pneumonia) vs Aspiration pneumonia ([place]) - Worsening imaging from prior admission as well as worsening hypoxia with increased mucus production suggest recurrent pneumonia rather than delayed healing on chest x-ray.  Continue broad-spectrum antibiotics for now given recurrent admissions and possibility of aspiration Zosyn and vancomycin patient is at high risk of recurrent aspiration recurrent pneumonia admissions  Possible UTI await results of urine culture for now we will cover with antibiotics patient is with indwelling Foley catheter  Brown emesis -GI has been consulted by ER provider, report brown emesis most likely due to known esophagitis continue supportive care reconsult them if needed Ordered PPI Patient has been found to be positive IgG for H. Pylori-per GI note unclear if indicative of acute infection.  Can discuss with GI in a.m. if would benefit from initiation of treatment at this time   . CKD  (chronic kidney disease) stage 2, GFR 60-89 ml/min avoid nephrotoxic medications continue to monitor  Hyperkalemia unclear etiology has been treated emergency department will repeat continue to treat father if continues to stay elevated  . Mixed hyperlipidemia -stable continue home medications  . Anemia due to chronic kidney disease -currently stable continue to monitor  . Acute respiratory failure with hypoxia (HCC) -worsening hypoxia in the setting of aspiration pneumonia continue trach care admit to stepdown given need for advanced nursing care And frequent suctioning  Dm -  - Order Sensitive  SSI   -  check TSH and HgA1C   . Essential hypertension -chronic stable  . hx of Pressure injury of skin - wound care consult  Other plan as per orders.  DVT prophylaxis:  SCD      Code Status:  FULL CODE  As per prior reccords  Family Communication:   Family not at  Bedside Attempted to call sister no answer  Disposition Plan:   likely will need placement for rehabilitation                                    Would benefit from PT/OT eval prior to DC  Ordered                   Swallow eval - SLP ordered  Nutrition    consulted                  Wound care  consulted                                       Consults called:  ER Discussed with GI at night no further recommendations at this time  Admission status:  ED Disposition    ED Disposition Condition Comment   Admit  The patient appears reasonably stabilized for admission considering the current resources, flow, and capabilities available in the ED at this time, and I doubt any other [facility] requiring further screening and/or treatment in the ED prior to admission is  present.         inpatient     Expect 2 midnight stay secondary to severity of patient's current illness including   hemodynamic instability despite optimal treatment ( hypoxia )   Severe lab/radiological/exam abnormalities  including:  Aspiration PNA   and extensive comorbidities including:  DM2 CKD .  history of stroke with residual deficits   That are currently affecting medical management.   I expect  patient to be hospitalized for 2 midnights requiring inpatient medical care.  Patient is at high risk for adverse outcome (such as loss of life or disability) if not treated.  Indication for inpatient stay as follows:    Hemodynamic instability despite maximal medical therapy,    inability to maintain oral hydration    Need for operative/procedural  intervention New or worsening hypoxia  Need for IV antibiotics, IV fluids,       Level of care          SDU tele indefinitely please discontinue once patient no longer qualifies  Precautions:  NONE   PPE: Used by the provider:   P100  eye Goggles,  Gloves       Danyell Awbrey 01/08/2019, 10:43 PM     [facility]     after 2 AM please page floor coverage PA If 7AM-7PM, please contact the day team taking care of the patient using [email or web address]

## 2019-01-09 ENCOUNTER — Inpatient Hospital Stay (HOSPITAL_COMMUNITY): Payer: Medicare HMO

## 2019-01-09 DIAGNOSIS — K257 Chronic gastric ulcer without hemorrhage or perforation: Secondary | ICD-10-CM

## 2019-01-09 DIAGNOSIS — I472 Ventricular tachycardia: Secondary | ICD-10-CM

## 2019-01-09 DIAGNOSIS — A048 Other specified bacterial intestinal infections: Secondary | ICD-10-CM

## 2019-01-09 DIAGNOSIS — K920 Hematemesis: Secondary | ICD-10-CM

## 2019-01-09 DIAGNOSIS — K210 Gastro-esophageal reflux disease with esophagitis: Secondary | ICD-10-CM

## 2019-01-09 LAB — GLUCOSE, CAPILLARY
Glucose-Capillary: 81 mg/dL (ref 70–99)
Glucose-Capillary: 87 mg/dL (ref 70–99)
Glucose-Capillary: 88 mg/dL (ref 70–99)
Glucose-Capillary: 88 mg/dL (ref 70–99)
Glucose-Capillary: 98 mg/dL (ref 70–99)

## 2019-01-09 LAB — CBC
HCT: 37.6 % — ABNORMAL LOW (ref 39.0–52.0)
Hemoglobin: 11 g/dL — ABNORMAL LOW (ref 13.0–17.0)
MCH: 29.2 pg (ref 26.0–34.0)
MCHC: 29.3 g/dL — ABNORMAL LOW (ref 30.0–36.0)
MCV: 99.7 fL (ref 80.0–100.0)
Platelets: 401 10*3/uL — ABNORMAL HIGH (ref 150–400)
RBC: 3.77 MIL/uL — ABNORMAL LOW (ref 4.22–5.81)
RDW: 14.3 % (ref 11.5–15.5)
WBC: 11.4 10*3/uL — ABNORMAL HIGH (ref 4.0–10.5)
nRBC: 0.2 % (ref 0.0–0.2)

## 2019-01-09 LAB — BASIC METABOLIC PANEL
Anion gap: 9 (ref 5–15)
BUN: 31 mg/dL — ABNORMAL HIGH (ref 8–23)
CO2: 27 mmol/L (ref 22–32)
Calcium: 9.5 mg/dL (ref 8.9–10.3)
Chloride: 103 mmol/L (ref 98–111)
Creatinine, Ser: 1.09 mg/dL (ref 0.61–1.24)
GFR calc Af Amer: >60 mL/min (ref >60)
GFR calc non Af Amer: >60 mL/min (ref >60)
Glucose, Bld: 71 mg/dL (ref 70–99)
Potassium: 4.6 mmol/L (ref 3.5–5.1)
Sodium: 139 mmol/L (ref 135–145)

## 2019-01-09 LAB — TSH: TSH: 3.942 u[IU]/mL (ref 0.350–4.500)

## 2019-01-09 LAB — MRSA PCR SCREENING: MRSA by PCR: NEGATIVE

## 2019-01-09 LAB — ECHOCARDIOGRAM COMPLETE
Height: 66 in
Weight: 2716.07 oz

## 2019-01-09 LAB — STREP PNEUMONIAE URINARY ANTIGEN: Strep Pneumo Urinary Antigen: NEGATIVE

## 2019-01-09 LAB — PHOSPHORUS: Phosphorus: 3.6 mg/dL (ref 2.5–4.6)

## 2019-01-09 LAB — COMPREHENSIVE METABOLIC PANEL
ALT: 13 U/L (ref 0–44)
AST: 14 U/L — ABNORMAL LOW (ref 15–41)
Albumin: 3.1 g/dL — ABNORMAL LOW (ref 3.5–5.0)
Alkaline Phosphatase: 70 U/L (ref 38–126)
Anion gap: 8 (ref 5–15)
BUN: 31 mg/dL — ABNORMAL HIGH (ref 8–23)
CO2: 28 mmol/L (ref 22–32)
Calcium: 9.3 mg/dL (ref 8.9–10.3)
Chloride: 102 mmol/L (ref 98–111)
Creatinine, Ser: 1.12 mg/dL (ref 0.61–1.24)
GFR calc Af Amer: >60 mL/min (ref >60)
GFR calc non Af Amer: >60 mL/min (ref >60)
Glucose, Bld: 71 mg/dL (ref 70–99)
Potassium: 4.3 mmol/L (ref 3.5–5.1)
Sodium: 138 mmol/L (ref 135–145)
Total Bilirubin: 0.4 mg/dL (ref 0.3–1.2)
Total Protein: 7.2 g/dL (ref 6.5–8.1)

## 2019-01-09 LAB — CBG MONITORING, ED: Glucose-Capillary: 81 mg/dL (ref 70–99)

## 2019-01-09 LAB — TROPONIN I (HIGH SENSITIVITY): Troponin I (High Sensitivity): 5 ng/L (ref <18)

## 2019-01-09 LAB — MAGNESIUM: Magnesium: 2.2 mg/dL (ref 1.7–2.4)

## 2019-01-09 MED ORDER — ONDANSETRON HCL 4 MG/2ML IJ SOLN: 4.0000 mg | Freq: Four times a day (QID) | INTRAMUSCULAR | Status: DC | PRN

## 2019-01-09 MED ORDER — OSMOLITE 1.2 CAL PO LIQD
1000.0000 mL | ORAL | Status: DC
  Administered 2019-01-09: 1000 mL
  Filled 2019-01-09: qty 1000

## 2019-01-09 MED ORDER — PIPERACILLIN-TAZOBACTAM 3.375 G IVPB
3.3750 g | Freq: Three times a day (TID) | INTRAVENOUS | Status: DC
  Administered 2019-01-09 – 2019-01-10 (×5): 3.375 g via INTRAVENOUS
  Filled 2019-01-09 (×6): qty 50

## 2019-01-09 MED ORDER — MIRTAZAPINE 15 MG PO TABS
7.5000 mg | ORAL_TABLET | Freq: Every day | ORAL | Status: DC
  Administered 2019-01-09 – 2019-01-11 (×2): 7.5 mg
  Filled 2019-01-09 (×2): qty 1

## 2019-01-09 MED ORDER — INSULIN ASPART 100 UNIT/ML ~~LOC~~ SOLN
0.0000 [IU] | SUBCUTANEOUS | Status: DC
  Administered 2019-01-10: 1 [IU] via SUBCUTANEOUS

## 2019-01-09 MED ORDER — ACETAMINOPHEN 160 MG/5ML PO SOLN: 650.0000 mg | Freq: Four times a day (QID) | ORAL | Status: DC | PRN

## 2019-01-09 MED ORDER — METOPROLOL TARTRATE 25 MG PO TABS: 50.0000 mg | ORAL_TABLET | Freq: Two times a day (BID) | ORAL | Status: DC

## 2019-01-09 MED ORDER — ACETAMINOPHEN 650 MG RE SUPP: 650.0000 mg | Freq: Four times a day (QID) | RECTAL | Status: DC | PRN

## 2019-01-09 MED ORDER — AMLODIPINE BESYLATE 10 MG PO TABS
10.0000 mg | ORAL_TABLET | Freq: Every day | ORAL | Status: DC
  Administered 2019-01-09 – 2019-01-11 (×3): 10 mg
  Filled 2019-01-09 (×3): qty 1

## 2019-01-09 MED ORDER — VANCOMYCIN HCL IN DEXTROSE 750-5 MG/150ML-% IV SOLN: 750.0000 mg | Freq: Two times a day (BID) | INTRAVENOUS | Status: DC

## 2019-01-09 MED ORDER — ONDANSETRON HCL 4 MG PO TABS: 4.0000 mg | ORAL_TABLET | Freq: Four times a day (QID) | ORAL | Status: DC | PRN

## 2019-01-09 MED ORDER — GABAPENTIN 250 MG/5ML PO SOLN
100.0000 mg | Freq: Three times a day (TID) | ORAL | Status: DC
  Administered 2019-01-09 – 2019-01-11 (×8): 100 mg
  Filled 2019-01-09 (×9): qty 2

## 2019-01-09 MED ORDER — METOPROLOL TARTRATE 25 MG/10 ML ORAL SUSPENSION
50.0000 mg | Freq: Two times a day (BID) | ORAL | Status: DC
  Administered 2019-01-09 – 2019-01-11 (×5): 50 mg via ORAL
  Filled 2019-01-09 (×6): qty 20

## 2019-01-09 MED ORDER — ORAL CARE MOUTH RINSE
15.0000 mL | Freq: Two times a day (BID) | OROMUCOSAL | Status: DC
  Administered 2019-01-09 – 2019-01-11 (×4): 15 mL via OROMUCOSAL

## 2019-01-09 MED ORDER — ACETAMINOPHEN 325 MG PO TABS: 650.0000 mg | ORAL_TABLET | Freq: Four times a day (QID) | ORAL | Status: DC | PRN

## 2019-01-09 MED ORDER — GLYCOPYRROLATE 1 MG PO TABS
1.0000 mg | ORAL_TABLET | Freq: Two times a day (BID) | ORAL | Status: DC
  Administered 2019-01-09 – 2019-01-11 (×6): 1 mg
  Filled 2019-01-09 (×8): qty 1

## 2019-01-09 MED ORDER — CHLORHEXIDINE GLUCONATE CLOTH 2 % EX PADS
6.0000 | MEDICATED_PAD | Freq: Every day | CUTANEOUS | Status: DC
  Administered 2019-01-09 – 2019-01-11 (×3): 6 via TOPICAL

## 2019-01-09 MED ORDER — HYDROCODONE-ACETAMINOPHEN 7.5-325 MG/15ML PO SOLN: 5.0000 mg | ORAL | Status: DC | PRN

## 2019-01-09 MED ORDER — SODIUM CHLORIDE 0.9 % IV SOLN
INTRAVENOUS | Status: AC
  Administered 2019-01-09: 02:00:00 via INTRAVENOUS

## 2019-01-09 MED ORDER — MONTELUKAST SODIUM 10 MG PO TABS
10.0000 mg | ORAL_TABLET | Freq: Every day | ORAL | Status: DC
  Administered 2019-01-09 – 2019-01-11 (×3): 10 mg
  Filled 2019-01-09 (×3): qty 1

## 2019-01-09 MED ORDER — CHLORHEXIDINE GLUCONATE 0.12 % MT SOLN
15.0000 mL | Freq: Two times a day (BID) | OROMUCOSAL | Status: DC
  Administered 2019-01-09 – 2019-01-11 (×5): 15 mL via OROMUCOSAL
  Filled 2019-01-09 (×5): qty 15

## 2019-01-09 MED ORDER — PRAVASTATIN SODIUM 20 MG PO TABS
20.0000 mg | ORAL_TABLET | Freq: Every day | ORAL | Status: DC
  Administered 2019-01-09 – 2019-01-11 (×3): 20 mg
  Filled 2019-01-09 (×3): qty 1

## 2019-01-09 MED ORDER — HYDROCODONE-ACETAMINOPHEN 5-325 MG PO TABS: 1.0000 | ORAL_TABLET | ORAL | Status: DC | PRN

## 2019-01-09 MED ORDER — PANTOPRAZOLE SODIUM 40 MG IV SOLR
40.0000 mg | Freq: Two times a day (BID) | INTRAVENOUS | Status: DC
  Administered 2019-01-09 – 2019-01-11 (×4): 40 mg via INTRAVENOUS
  Filled 2019-01-09 (×6): qty 40

## 2019-01-09 MED ORDER — METOCLOPRAMIDE HCL 10 MG/10ML PO SOLN
5.0000 mg | Freq: Three times a day (TID) | ORAL | Status: DC
  Administered 2019-01-09 – 2019-01-11 (×7): 5 mg
  Filled 2019-01-09 (×3): qty 10
  Filled 2019-01-09: qty 5
  Filled 2019-01-09 (×3): qty 10
  Filled 2019-01-09: qty 5
  Filled 2019-01-09 (×3): qty 10

## 2019-01-09 MED ORDER — CLONIDINE HCL 0.1 MG/24HR TD PTWK: 0.1000 mg | MEDICATED_PATCH | TRANSDERMAL | Status: DC

## 2019-01-09 MED ORDER — PANTOPRAZOLE SODIUM 40 MG PO PACK: 40.0000 mg | PACK | Freq: Two times a day (BID) | ORAL | Status: DC

## 2019-01-09 MED ORDER — GABAPENTIN 100 MG PO CAPS: 100.0000 mg | ORAL_CAPSULE | Freq: Three times a day (TID) | ORAL | Status: DC

## 2019-01-09 MED ORDER — MIRTAZAPINE 15 MG PO TABS: 7.5000 mg | ORAL_TABLET | Freq: Every day | ORAL | Status: DC

## 2019-01-09 NOTE — Consult Note (Addendum)
Consultation  Referring Provider: Dr. Wyline Copas      Primary Care Physician:  [facility] [place] Primary Gastroenterologist: Althia Forts (previously Dr. Rush Landmark during last admission-primary GI at [facility])      Reason for Consultation: Coffee-ground emesis         HPI:   Scott Rivera is a 68 y.o. male with past medical history of CVA 2018 at which time he underwent tracheostomy and PEG placement on chronic Plavix and aspirin, chronic loculated pleural effusion, pressure ulcers, recurrent fecal impactions, CKD 3 and multiple others listed below, who presented to the ER 01/08/2019 with a complaint of further coffee-ground emesis.    Recent admission 12/31/2018, we were consulted for burgundy blood coming from the mouth and tracheostomy.  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  Pathology revealed duodenal adenoma and inflamed squamous mucosa in the esophagus.  H. pylori serology showed evidence of positivity and IgG, this was to be followed by his primary care provider.    History is garnered from previous physicians notes as patient is unable to provide it. This admission patient presented with nausea and vomiting a somewhat brown color material similar to prior episodes.  His sister explained at admission that he had been foaming from his trach.  She was taking care of him full-time and attempted to use Zofran but it did not seem to help.  Patient is on 2 L of oxygen chronically.  He is also on Plavix which was held during his last admission, but restarted 48 hours later.  Recently diagnosed with aspiration pneumonia.    At time of interview patient denies abdominal pain.    ER course: Hypoxic,  on 2 L, potassium up to 6.2, found to have UTI, diagnosed with recurrent aspiration pneumonia  Past Medical History:  Diagnosis Date  . Acute pulmonary edema (HCC)   . Anemia due to chronic kidney disease   . ARF (acute renal failure) ([place]) 10/19/2016  . Aspiration pneumonia ([place])   . Cerebellar infarct ([place]) 10/19/2016  . Chronic kidney disease, stage 3 (moderate) (HCC)   . CKD (chronic kidney disease) stage 2, GFR 60-89 ml/min 01/02/2017  . 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 Gastrostomy tube obstruction ([place])   . HCAP (healthcare-associated pneumonia)   . Helicobacter pylori infection 09/21/2012  . History of adenomatous polyp of colon 07/28/2015  . Hypertension   . MDR Acinetobacter baumannii infection   . Pneumothorax, closed, traumatic, initial encounter 12/10/2016  . Renal insufficiency   . Rhabdomyolysis 10/19/2016  . S/P percutaneous endoscopic gastrostomy (PEG) tube placement ([place]) 03/30/2017  . Stage III pressure ulcer of sacral region ([place]) 12/17/2016  . Stroke ([place])   . UTI (urinary tract infection) 03/30/2017    Past Surgical History:  Procedure Laterality Date  . BIOPSY  01/01/2019   Procedure: BIOPSY;  Surgeon: Irving Copas., MD;  Location: [facility];  Service: Gastroenterology;;  . ESOPHAGOGASTRODUODENOSCOPY (EGD) WITH PROPOFOL N/A 01/01/2019   Procedure: ESOPHAGOGASTRODUODENOSCOPY (EGD) WITH PROPOFOL;  Surgeon: Irving Copas., MD;  Location: [facility];  Service: Gastroenterology;  Laterality: N/A;  . IR GASTROSTOMY TUBE MOD SED  10/27/2016  . IR PATIENT EVAL TECH 0-60 MINS  01/05/2017  . IR PATIENT EVAL TECH 0-60 MINS  01/09/2017  . IR PATIENT EVAL TECH 0-60 MINS  03/03/2017  . IR PATIENT EVAL TECH 0-60 MINS  04/03/2017  . IR REPLACE G-TUBE SIMPLE WO FLUORO  11/20/2017  . IR [place] TUBE PERCUT W/FLUORO  07/23/2017  . KNEE SURGERY    . TRACHEOSTOMY TUBE PLACEMENT      Family History  Problem Relation Age of  Onset  . Diabetes Mellitus II Brother   . CAD Neg Hx   . Stroke Neg Hx     Social History   Tobacco Use  . Smoking status: Former Research scientist (life sciences)  . Smokeless tobacco: Never Used  . Tobacco comment: Smoked heavily until the day of his stroke  Substance Use Topics  . Alcohol use: Not Currently    Comment: Drank heavily prior to stroke per sister  . Drug use: Not Currently    Prior to Admission medications   Medication Sig Start Date End Date Taking? Authorizing Provider  acetaminophen (TYLENOL) 500 MG tablet Place 1,000 mg into feeding tube 3 (three) times daily.    Yes [provider]  amLODipine (NORVASC) 10 MG tablet Place 1 tablet (10 mg total) into feeding tube daily. 11/23/17  Yes Kayleen Memos, DO  cloNIDine (CATAPRES - DOSED IN MG/24 HR) 0.1 mg/24hr patch Place 1 patch (0.1 mg total) onto the skin every Tuesday. 04/16/18  Yes Aline August, MD  clopidogrel (PLAVIX) 75 MG tablet Place 1 tablet (75 mg total) into feeding tube daily. 01/04/19  Yes Mariel Aloe, MD  Ferrous Sulfate (IRON) 325 (65 Fe) MG TABS Place 325 mg into feeding tube daily.    Yes [provider]  gabapentin (NEURONTIN) 100 MG capsule Take 100 mg by mouth 3 (three) times daily. Open 1 capsule (100 mg) and administer contents via feeding tube three times daily   Yes [provider]  glycopyrrolate (ROBINUL) 1 MG tablet Place 1 mg into feeding tube 2 (two) times daily.  11/18/18  Yes [provider]  metoprolol tartrate (LOPRESSOR) 50 MG tablet Take 1 tablet (50 mg total) by mouth 2 (two) times daily. 12/21/17  Yes Thurnell Lose, MD  mirtazapine (REMERON) 15 MG tablet Take 7.5 mg by mouth at bedtime. 12/24/18  Yes [provider]  montelukast (SINGULAIR) 10 MG tablet Place 10 mg into feeding tube daily.    Yes [provider]  Nutritional Supplements (FEEDING SUPPLEMENT, JEVITY 1.2 CAL,) LIQD Initiate at 35 ml/hr and increase by 10 ml every 8 hours to reach goal rate of  65 ml/hr. 01/02/19  Yes Mariel Aloe, MD  ondansetron (ZOFRAN) 4 MG tablet Place 1 tablet (4 mg total) into feeding tube every 6 (six) hours as needed for nausea. 11/24/18  Yes Lucrezia Starch, MD  pantoprazole sodium (PROTONIX) 40 mg/20 mL PACK Place 20 mLs (40 mg total) into feeding tube 2 (two) times daily. 01/02/19  Yes Mariel Aloe, MD  pravastatin (PRAVACHOL) 20 MG tablet Place 1 tablet (20 mg total) into feeding tube daily at 6 PM. Patient taking differently: Place 20 mg daily into feeding tube.  01/10/17  Yes Cherene Altes, MD  vitamin C (ASCORBIC ACID) 500 MG tablet Take 500 mg by mouth daily.   Yes [provider]  scopolamine (TRANSDERM-SCOP) 1 MG/3DAYS Place 1 mg onto the skin every 3 (three) days. 12/30/18   [provider]  Water For Irrigation, Sterile (FREE WATER) SOLN Place 100 mLs into feeding tube every 3 (three) hours. Patient not taking: Reported  on 11/19/2018 04/16/18   Aline August, MD    Current Facility-Administered Medications  Medication Dose Route Frequency Provider Last Rate Last Dose  . 0.9 %  sodium chloride infusion   Intravenous Continuous Doutova, Anastassia, MD 75 mL/hr at 01/09/19 0800    . acetaminophen (TYLENOL) solution 650 mg  650 mg Per Tube Q6H PRN Toy Baker, MD       Or  . acetaminophen (TYLENOL) suppository 650 mg  650 mg Rectal Q6H PRN Doutova, Anastassia, MD      . amLODipine (NORVASC) tablet 10 mg  10 mg Per Tube Daily Doutova, Anastassia, MD      . chlorhexidine (PERIDEX) 0.12 % solution 15 mL  15 mL Mouth Rinse BID Doutova, Anastassia, MD      . Chlorhexidine Gluconate Cloth 2 % PADS 6 each  6 each Topical Daily Doutova, Anastassia, MD      . Derrill Memo ON 01/14/2019] cloNIDine (CATAPRES - Dosed in mg/24 hr) patch 0.1 mg  0.1 mg Transdermal Q Tue Doutova, Anastassia, MD      . gabapentin (NEURONTIN) 250 MG/5ML solution 100 mg  100 mg Per Tube TID Doutova, Anastassia, MD      . glycopyrrolate (ROBINUL) tablet 1 mg  1  mg Per Tube BID Toy Baker, MD   1 mg at 01/09/19 0258  . HYDROcodone-acetaminophen (HYCET) 7.5-325 mg/15 ml solution 5-7.5 mg of hydrocodone  5-7.5 mg of hydrocodone Per Tube Q4H PRN Doutova, Anastassia, MD      . insulin aspart (novoLOG) injection 0-9 Units  0-9 Units Subcutaneous Q4H Doutova, Anastassia, MD      . MEDLINE mouth rinse  15 mL Mouth Rinse q12n4p Doutova, Anastassia, MD      . metoprolol tartrate (LOPRESSOR) 25 mg/10 mL oral suspension 50 mg  50 mg Oral BID Toy Baker, MD   50 mg at 01/09/19 0258  . mirtazapine (REMERON) tablet 7.5 mg  7.5 mg Per Tube QHS Doutova, Anastassia, MD      . montelukast (SINGULAIR) tablet 10 mg  10 mg Per Tube Daily Doutova, Anastassia, MD      . ondansetron (ZOFRAN) injection 4 mg  4 mg Intravenous Q6H PRN Doutova, Anastassia, MD       Or  . ondansetron (ZOFRAN) tablet 4 mg  4 mg Per Tube Q6H PRN Doutova, Anastassia, MD      . pantoprazole (PROTONIX) injection 40 mg  40 mg Intravenous Q12H Doutova, Anastassia, MD      . piperacillin-tazobactam (ZOSYN) IVPB 3.375 g  3.375 g Intravenous Q8H Doutova, Anastassia, MD 12.5 mL/hr at 01/09/19 0800    . pravastatin (PRAVACHOL) tablet 20 mg  20 mg Per Tube Daily Doutova, Anastassia, MD      . vancomycin (VANCOCIN) IVPB 750 mg/150 ml premix  750 mg Intravenous Q12H Toy Baker, MD        Allergies as of 01/08/2019  . (No Known Allergies)     Review of Systems:    Per admitting physician: Pertinent positives include: emesis, cough  Constitutional:  No weight loss, night sweats, Fevers, chills, fatigue, weight loss  HEENT:  No headaches, Difficulty swallowing,Tooth/dental problems,Sore throat,  No sneezing, itching, ear ache, nasal congestion, post nasal drip,  Cardio-vascular:  No chest pain, Orthopnea, PND, anasarca, dizziness, palpitations.no Bilateral lower extremity swelling  GI:  No heartburn, indigestion, abdominal pain, nausea, vomiting, diarrhea, change in bowel  habits, loss of appetite, melena, blood in stool, hematemesis Resp:  no shortness of breath at rest. No dyspnea on  exertion, No excess mucus, no productive cough, No non-productive cough, No coughing up of blood.No change in color of mucus.No wheezing. Skin:  no rash or lesions. No jaundice GU:  no dysuria, change in color of urine, no urgency or frequency. No straining to urinate.  No flank pain.  Musculoskeletal:  No joint pain or no joint swelling. No decreased range of motion. No back pain.  Psych:  No change in mood or affect. No depression or anxiety. No memory loss.  Neuro: no localizing neurological complaints, no tingling, no weakness, no double vision, no gait abnormality, no slurred speech, no confusion   Physical Exam:  Vital signs in last 24 hours: Temp:  [97.8 F (36.6 C)-98.5 F (36.9 C)] 98.2 F (36.8 C) (09/24 0405) Pulse Rate:  [75-93] 75 (09/24 0820) Resp:  [11-20] 18 (09/24 0820) BP: (122-159)/(59-85) 130/65 (09/24 0800) SpO2:  [86 %-96 %] 92 % (09/24 0820) FiO2 (%):  [80 %] 80 % (09/24 0820) Weight:  [77 kg] 77 kg (09/24 0115)   General:   AA male appears to be in NAD, Well developed, chronically ill appearing, tracheostomy Head:  Normocephalic and atraumatic. Eyes:   PEERL, EOMI. No icterus. Conjunctiva pink. Ears:  Normal auditory acuity. Neck:  Supple Throat: Oral cavity and pharynx without inflammation, swelling or lesion.  Lungs: Respirations even and unlabored. Lungs clear to auscultation bilaterally.   No wheezes, crackles, or rhonchi.  Heart: Normal S1, S2. No MRG. Regular rate and rhythm. No peripheral edema, cyanosis or pallor.  Abdomen:  Soft, nondistended, nontender. No rebound or guarding. Normal bowel sounds. No appreciable masses or hepatomegaly.+gtube, no erythema or ttp Rectal:  Not performed.  Msk:  Bony changes over knees. Peripheral pulses intact.  Extremities:  Trace edema Neurologic:  Alert ;  grossly normal neurologically. Skin:    Dry and intact without significant lesions or rashes. Psychiatric:  without abnormal affect or behaviors.   LAB RESULTS: Recent Labs    01/08/19 1916 01/09/19 0235  WBC 13.7* 11.4*  HGB 11.7* 11.0*  HCT 38.7* 37.6*  PLT 390 401*   BMET Recent Labs    01/08/19 1916 01/09/19 0152 01/09/19 0235  NA 137 139 138  K 6.2* 4.6 4.3  CL 101 103 102  CO2 27 27 28   GLUCOSE 86 71 71  BUN 29* 31* 31*  CREATININE 1.17 1.09 1.12  CALCIUM 9.4 9.5 9.3   LFT Recent Labs    01/09/19 0235  PROT 7.2  ALBUMIN 3.1*  AST 14*  ALT 13  ALKPHOS 70  BILITOT 0.4    STUDIES: Dg Chest 2 View  Result Date: 01/08/2019 CLINICAL DATA:  Pt was admitted from 9/15-9/17 with an upper GI bleed. He underwent an endoscopy on 9/16. Sister reported that she was notified that pt had "abnormal abdominal results" from the gastrologist. EXAM: CHEST - 2 VIEW COMPARISON:  Chest radiograph 01/01/2019, 12/31/2018 FINDINGS: Tracheostomy in place. Stable cardiomediastinal contours. Central vascular congestion with diffuse bilateral prominent interstitial markings. Low volume study. Slightly increased opacity at the medial right lung base suspicious for infection. Scattered atelectasis no pneumothorax or large pleural effusion. No acute finding in the visualized skeleton. IMPRESSION: 1. Mildly increased opacity at the medial right lung base suspicious for infection. 2. Central vascular congestion with probable trace edema and atelectasis. Electronically Signed   By: Audie Pinto M.D.   On: 01/08/2019 18:41     Impression / Plan:   Impression: 1.  Coffee-ground emesis: Continue PPI, likely due to known  esophagitis, question regarding H. pylori infection, positive IgG in the past 2.  Tracheostomy and gastrostomy tube dependent 3.  Anemia: Somewhat stable hemoglobin 11 (11.7 on 01/08/2019, 10.8 on 01/02/2019) 4.  HCAP Versus aspiration pneumonia: With hypoxia and acute respiratory failure, with increased mucus, patient  is on broad-spectrum antibiotics 5.  Possible UTI: Indwelling catheter, awaiting urine culture 6.  CKD stage II  Plan: 1.  At this time coffee-ground emesis is believed from known esophagitis, agree with twice daily PPI 2.  There is no need for repeat EGD at this time, especially given worsening pneumonia which would put patient at increased risk during procedure.  Would only pursue if further signs of acute GI bleeding and patient became unstable. 3.  Please await any further recommendations from Dr. Loletha Carrow later today.  Thank you for your kind consultation, we will continue to follow.  Lavone Nian [facility]  01/09/2019, 8:29 AM  I have reviewed the entire case in detail with the above APP and discussed the plan in detail.  Therefore, I agree with the diagnoses recorded above. In addition,  I have personally interviewed and examined the patient and have personally reviewed any abdominal/pelvic CT scan images.  My additional thoughts are as follows:  Recurrent hematemesis manifest as coffee-ground emesis from the patient's known severe reflux esophagitis.  Unfortunately, this is a very common problem in debilitated patients on tube feedings such as this.  There are reports of intermittent vomiting, though it is not known if this might actually be severe regurgitation.  Either way, there is reportedly clinical concern for aspiration pneumonia.  He does not need a repeat upper endoscopy, as he is not having brisk upper GI bleeding suggest the need for endoscopic hemostasis.  The clean based small ulcer recently found under the internal gastrostomy balloon does not appear likely to be the source of bleeding.  H. pylori was discovered on biopsies, but treatment for that should be deferred to the outpatient setting when he is through his current infection and hospitalization.  Twice daily PPI by G-tube.  Head of bed elevated 30 degrees at all times  Resume his regular tube feeding protocol.  In  case he is having delayed gastric emptying, we will start metoclopramide 5 mg 3 times daily via G-tube.  If that is not helping, or felt not to be a good long-term plan, and if similar problems continue, then his regular GI practice at [facility] can make outpatient plans for conversion of his gastrostomy to a GJ tube.  That would be best done after he has recovered from current respiratory infection.  We will sign off, reconsult as needed.  Nelida Meuse III Office:[phone]

## 2019-01-09 NOTE — Progress Notes (Signed)
Pharmacy Antibiotic Note  Scott Rivera is a 68 y.o. male admitted on 01/08/2019 with pneumonia.  Pharmacy has been consulted for Vancomycin and Zosyn dosing.  Plan: Vancomycin 1.5gm iv x1, then Vancomycin 750 mg IV Q 12 hrs. Goal AUC 400-550. Expected AUC: 523 SCr used: 1.12  Zosyn 3.375g IV Q8H infused over 4hrs.  Height: 5\' 6"  (167.6 cm) Weight: 169 lb 12.1 oz (77 kg) IBW/kg (Calculated) : 63.8  Temp (24hrs), Avg:98.2 F (36.8 C), Min:97.8 F (36.6 C), Max:98.5 F (36.9 C)  Recent Labs  Lab 01/08/19 1916 01/09/19 0152 01/09/19 0235  WBC 13.7*  --  11.4*  CREATININE 1.17 1.09 1.12    Estimated Creatinine Clearance: 61.7 mL/min (by C-G formula based on SCr of 1.12 mg/dL).    No Known Allergies  Antimicrobials this admission: Vancomycin 01/09/2019 >> Zosyn 01/09/2019 >>  Cefepime 01/08/2019 x1   Dose adjustments this admission: -  Microbiology results: -  Thank you for allowing pharmacy to be a part of this patient's care.  Nani Skillern Crowford 01/09/2019 5:59 AM

## 2019-01-09 NOTE — ED Notes (Signed)
ICU called to see if pt can be brought up now.

## 2019-01-09 NOTE — Progress Notes (Signed)
PT Cancellation/Screen Note  Patient Details Name: Scott Rivera MRN: [id number] DOB: [birth year]   Cancelled Treatment:      Reason Eval/Treat Not Completed: PT screened, no needs identified, will sign off. Per chart, pt is bedbound at home. Reviewed previous therapy notes, pt has been Max-Total Assist +2 since CVA in 10/2016. Briefly assessed pt in room, he did follow commands for partial assessment of strength/ROM and answered yes/no questions appropriately by nodding/shaking head.  Pt adamantly refuses offer to attempt sitting up at EOB and indicates he does not do this at home and, in fact, spends all his time in bed. Based on history and current presentation, pt appears to be total care at baseline. Will sign off at this time.    Wilder Kurowski 01/09/2019, 4:23 PM

## 2019-01-09 NOTE — Progress Notes (Signed)
Initial Nutrition Assessment  DOCUMENTATION CODES:   Not applicable  INTERVENTION:  - when TF able to be re-started, recommend: Osmolite 1.2 @ 25 ml/hr to advance by 10 ml every 12 hours to reach goal rate of 65 ml/hr with 30 ml prostat once/day. - at goal rate, this regimen will provide 1972 kcal, 102 grams protein, and 1279 ml free water.  - Osmolite is a fiber-free formula and may be beneficial d/t ongoing issues with N/V.    NUTRITION DIAGNOSIS:   Inadequate oral intake related to inability to eat as evidenced by NPO status.   GOAL:   Patient will meet greater than or equal to 90% of their needs  MONITOR:   Labs, Weight trends, I & O's  REASON FOR ASSESSMENT:   Diagnosis  ASSESSMENT:   68 y.o. male with medical history significant of CVA s/p trach and PEG, chronic indwelling Foley, decubitus ulcers, HLD, and HTN. Patient presented to the ED on 9/23 with nausea and emesis which was brown in color, similar to previous episodes. Sister reported foaming from his trach. She has been taking care of him full time. She attempted Zofran, which did not help. He had been notified by his GI office that he had biopsy during previous admission which showed H. pylori. He was admitted in December 2019 with coffee-ground emesis and earlier this month with vomiting bright red blood. He was discharged on 9/17. During that admission he was diagnosed with aspiration PNA.  Patient is NPO and is at baseline; TF dependence for nutrition. Patient resting with eyes closed at this time and did not awake even when RD introduced self or completed limited NFPE. Tech in room performing cardiac ultrasound so limited NFPE done at this time. No family visitors present. Patient was seen by this RD during hospitalization earlier in the month. At that time, he was receiving Jevity 1.2 @ 35 ml/hr to advance by 10 ml every 8 hours to goal rate of 65 ml/hr with 30 ml prostat once/day. Goal rate regimen provides 1972  kcal, 102 grams protein, and 1259 ml free water.   Weight stable throughout this month but up from the past 1 year; weight had been stable at 160-163 lb from 12/21/17-11/19/18.   Per notes: - GI is following - coffee-ground emesis--PPI ordered, thought to be 2/2 known esophagitis, H. Pylori - anemia d/t CKD - HCAP vs aspiration PNA - possible UTI - acute respiratory failure with hypoxia   Labs reviewed; CBGs: 81 mg/dl x2 and 87 mg/dl, BUN: 31 mg/dl. Medications reviewed; sliding scale novolog, 40 mg IV protonix BID.  IVF; NS @ 75 ml/hr.     NUTRITION - FOCUSED PHYSICAL EXAM:    Most Recent Value  Orbital Region  No depletion  Upper Arm Region  No depletion  Thoracic and Lumbar Region  Unable to assess  Buccal Region  No depletion  Temple Region  No depletion  Clavicle Bone Region  Unable to assess  Clavicle and Acromion Bone Region  Unable to assess  Scapular Bone Region  Unable to assess  Dorsal Hand  No depletion  Patellar Region  No depletion  Anterior Thigh Region  No depletion  Posterior Calf Region  No depletion  Edema (RD Assessment)  Moderate [BLE]  Hair  Reviewed  Eyes  Unable to assess  Mouth  Unable to assess  Skin  Reviewed  Nails  Reviewed       Diet Order:   Diet Order  Diet NPO time specified  Diet effective now              EDUCATION NEEDS:   No education needs have been identified at this time  Skin:  Skin Assessment: Reviewed RN Assessment(documentation of stage 2 sacrum from 03/2018 and WOC note this admission states skin now WDL.)  Last BM:  PTA/unknown  Height:   Ht Readings from Last 1 Encounters:  01/09/19 5\' 6"  (1.676 m)    Weight:   Wt Readings from Last 1 Encounters:  01/09/19 77 kg    Ideal Body Weight:  64.5 kg  BMI:  Body mass index is 27.4 kg/m.  Estimated Nutritional Needs:   Kcal:  1925-2160 kcal  Protein:  95-105 grams  Fluid:  >/= 2 L/day     Jarome Matin, MS, RD, LDN, [facility]  Clinical Dietitian Pager # [phone] After hours/weekend pager # [phone]

## 2019-01-09 NOTE — Progress Notes (Signed)
Had 5 beat run of vtach and showed possible ST elevation on heart monitor. EKG obtained. Shows slight elevation. MF notified. Troponins and echo ordered. Pt does not appear to be in any distress, no complaints of chest pain. Will continue to closely monitor at this time.

## 2019-01-09 NOTE — Progress Notes (Signed)
PROGRESS NOTE    Scott Rivera  [id number] DOB: Mar 07, 1951 DOA: 01/08/2019 PCP: [facility]    Brief Narrative:  68 y.o. male with medical history significant of CVA sp trach, G-tube dependent,  Chronic Indwelling Foley Catheter, decubitus ulcers,  HLD, HTN,    Admitted for recurrent aspiration pneumonia, questionable UTI, coffee-ground emesis  Assessment & Plan:   Active Problems:   Essential hypertension   History of cerebral infarction   Pressure injury of skin   Dysphagia, post-stroke   Diabetes mellitus (HCC)   CKD (chronic kidney disease) stage 2, GFR 60-89 ml/min   Mixed hyperlipidemia   Anemia due to chronic kidney disease   History of tracheostomy   Acute respiratory failure with hypoxia (HCC)   Foley catheter in place on admission   Aspiration pneumonia ([place])   HCAP (healthcare-associated pneumonia)   Hyperkalemia   Present on Admission:  HCAP (healthcare-associated pneumonia) vs Aspiration pneumonia ([place]) - -Noted to have worsening imaging from prior admission as well as worsening hypoxia with increased mucus production suggest recurrent pneumonia -Prior culture results demonstrated pseudomonas species sensitive to zosyn, will continue zosyn monotherapy. -MRSA screen neg. Vanc d/c'd as MRSA pneumonia is very unlikely  Possible UTI  -Urine culture pending -Continue on empiric zosyn as tolerated  Brown emesis - -GI has been consulted by ER provider, report brown emesis most likely due to known esophagitis - Continue on PPI as tolerated -GI has recommended trial of q8hrs reglan   CKD (chronic kidney disease) stage 2, GFR 60-89 ml/min  -Cont to avoid nephrotoxic medications -Repeat bmet in AM  Hyperkalemia  -Normalized -Will repeat bmet in AM   Mixed hyperlipidemia  -remains stable at this time - continue home medications   Anemia due to chronic kidney disease  -currently stable continue to monitor -Stable at this time.  Repeat CBC in AM   Acute respiratory failure with hypoxia (HCC)  -worsening hypoxia in the setting of aspiration pneumonia continue trach care admit to stepdown given need for advanced nursing care and frequent suctioning -Remains with very coarse breath sounds  Dm -  - Order Sensitive  SSI   - TSH of 3.942 -HgA1C pending    Essential hypertension  -Remains stable at this time   hx of Pressure injury of skin, resolved - wound care consulted with no pressure injury to sacrum noted -Instructions provided to add end-date of old pressure injury from 2019  DVT prophylaxis: SCD's Code Status: Full Family Communication: Pt in room, family not at bedside Disposition Plan: Uncertain at this time  Consultants:   GI  Procedures:     Antimicrobials: Anti-infectives (From admission, onward)   Start     Dose/Rate Route Frequency Ordered Stop   01/09/19 1800  vancomycin (VANCOCIN) IVPB 750 mg/150 ml premix  Status:  Discontinued     750 mg 150 mL/hr over 60 Minutes Intravenous Every 12 hours 01/09/19 0558 01/09/19 1003   01/09/19 0600  piperacillin-tazobactam (ZOSYN) IVPB 3.375 g     3.375 g 12.5 mL/hr over 240 Minutes Intravenous Every 8 hours 01/09/19 0153     01/08/19 2015  ceFEPIme (MAXIPIME) 1 g in sodium chloride 0.9 % 100 mL IVPB  Status:  Discontinued     1 g 200 mL/hr over 30 Minutes Intravenous  Once 01/08/19 1944 01/08/19 1954   01/08/19 2000  vancomycin (VANCOCIN) 1,500 mg in sodium chloride 0.9 % 500 mL IVPB     1,500 mg 250 mL/hr over 120 Minutes Intravenous  Once 01/08/19 1950 01/09/19 0111   01/08/19 2000  ceFEPIme (MAXIPIME) 2 g in sodium chloride 0.9 % 100 mL IVPB     2 g 200 mL/hr over 30 Minutes Intravenous  Once 01/08/19 1954 01/09/19 0027       Subjective: Unable to assess given trach  Objective: Vitals:   01/09/19 1100 01/09/19 1148 01/09/19 1200 01/09/19 1300  BP: 124/62  (!) 116/59 118/64  Pulse: 92 73 66 67  Resp: 19 17 13 15   Temp:        TempSrc:      SpO2: 98% 98% 98% 94%  Weight:      Height:        Intake/Output Summary (Last 24 hours) at 01/09/2019 1427 Last data filed at 01/09/2019 1200 Gross per 24 hour  Intake 1187.8 ml  Output 875 ml  Net 312.8 ml   Filed Weights   01/09/19 0115  Weight: 77 kg    Examination:  General exam: Appears calm and comfortable  Respiratory system: coarse breath sounds. Respiratory effort increased Cardiovascular system: S1 & S2 heard, RRR. Gastrointestinal system: Abdomen is nondistended, soft and nontender. No organomegaly or masses felt. Normal bowel sounds heard. Central nervous system: Alert. No focal neurological deficits. Extremities: Symmetric 5 x 5 power. Skin: No rashes, lesions Psychiatry: Unable to assess as pt is nonverbal.   Data Reviewed: I have personally reviewed following labs and imaging studies  CBC: Recent Labs  Lab 01/08/19 1916 01/09/19 0235  WBC 13.7* 11.4*  NEUTROABS 9.5*  --   HGB 11.7* 11.0*  HCT 38.7* 37.6*  MCV 97.7 99.7  PLT 390 [id number]*   Basic Metabolic Panel: Recent Labs  Lab 01/08/19 1916 01/09/19 0152 01/09/19 0235  NA 137 139 138  K 6.2* 4.6 4.3  CL 101 103 102  CO2 27 27 28   GLUCOSE 86 71 71  BUN 29* 31* 31*  CREATININE 1.17 1.09 1.12  CALCIUM 9.4 9.5 9.3  MG  --   --  2.2  PHOS  --   --  3.6   GFR: Estimated Creatinine Clearance: 61.7 mL/min (by C-G formula based on SCr of 1.12 mg/dL). Liver Function Tests: Recent Labs  Lab 01/08/19 1916 01/09/19 0235  AST 28 14*  ALT 10 13  ALKPHOS 72 70  BILITOT 0.9 0.4  PROT 7.6 7.2  ALBUMIN 3.2* 3.1*   Recent Labs  Lab 01/08/19 1916  LIPASE 20   No results for input(s): AMMONIA in the last 168 hours. Coagulation Profile: No results for input(s): INR, PROTIME in the last 168 hours. Cardiac Enzymes: No results for input(s): CKTOTAL, CKMB, CKMBINDEX, TROPONINI in the last 168 hours. BNP (last 3 results) No results for input(s): PROBNP in the last 8760  hours. HbA1C: No results for input(s): HGBA1C in the last 72 hours. CBG: Recent Labs  Lab 01/09/19 0038 01/09/19 0354 01/09/19 0817 01/09/19 1150  GLUCAP 81 81 87 88   Lipid Profile: No results for input(s): CHOL, HDL, LDLCALC, TRIG, CHOLHDL, LDLDIRECT in the last 72 hours. Thyroid Function Tests: Recent Labs    01/09/19 0235  TSH 3.942   Anemia Panel: No results for input(s): VITAMINB12, FOLATE, FERRITIN, TIBC, IRON, RETICCTPCT in the last 72 hours. Sepsis Labs: No results for input(s): PROCALCITON, LATICACIDVEN in the last 168 hours.  Recent Results (from the past 240 hour(s))  Blood culture (routine x 2)     Status: None   Collection Time: 12/31/18  8:00 AM   Specimen: BLOOD  Result Value  Ref Range Status   Specimen Description   Final    BLOOD RIGHT ANTECUBITAL Performed at [facility] [address], [place], [place] [address]    Special Requests   Final    BOTTLES DRAWN AEROBIC AND ANAEROBIC Blood Culture results may not be optimal due to an excessive volume of blood received in culture bottles Performed at [facility] [address] [place], [place] [address]    Culture   Final    NO GROWTH 5 DAYS Performed at [facility], [place] [address], [place], [place] [address]    Report Status 01/05/2019 FINAL  Final  SARS CORONAVIRUS 2 (TAT 6-24 HRS) Nasopharyngeal Nasopharyngeal Swab     Status: None   Collection Time: 12/31/18  8:19 AM   Specimen: Nasopharyngeal Swab  Result Value Ref Range Status   SARS Coronavirus 2 NEGATIVE NEGATIVE Final    Comment: (NOTE) SARS-CoV-2 target nucleic acids are NOT DETECTED. The SARS-CoV-2 RNA is generally detectable in upper and lower respiratory specimens during the acute phase of infection. Negative results do not preclude SARS-CoV-2 infection, do not rule out co-infections with other pathogens, and should not be used as the sole basis for treatment or other patient management  decisions. Negative results must be combined with clinical observations, patient history, and epidemiological information. The expected result is Negative. Fact Sheet for Patients: SugarRoll.be Fact Sheet for Healthcare Providers: https://www.woods-mathews.com/ This test is not yet approved or cleared by the [place] FDA and  has been authorized for detection and/or diagnosis of SARS-CoV-2 by FDA under an Emergency Use Authorization (EUA). This EUA will remain  in effect (meaning this test can be used) for the duration of the COVID-19 declaration under Section 56 4(b)(1) of the Act, 21 U.S.C. section 360bbb-3(b)(1), unless the authorization is terminated or revoked sooner. Performed at [facility], [place] [address], [place], [place] [address]   Blood culture (routine x 2)     Status: None   Collection Time: 12/31/18  8:55 AM   Specimen: BLOOD  Result Value Ref Range Status   Specimen Description   Final    BLOOD BLOOD RIGHT FOREARM Performed at [facility] [address] [place], [place] [address]    Special Requests   Final    BOTTLES DRAWN AEROBIC AND ANAEROBIC Blood Culture results may not be optimal due to an excessive volume of blood received in culture bottles Performed at [facility] [address], [place], [place] [address]    Culture   Final    NO GROWTH 5 DAYS Performed at [facility], [place] [address], [place],  [address]    Report Status 01/05/2019 FINAL  Final  MRSA PCR Screening     Status: None   Collection Time: 12/31/18  4:54 PM   Specimen: Nasopharyngeal  Result Value Ref Range Status   MRSA by PCR NEGATIVE NEGATIVE Final    Comment:        The GeneXpert MRSA Assay (FDA approved for NASAL specimens only), is one component of a comprehensive MRSA colonization surveillance program. It is not intended to diagnose MRSA infection nor to guide  or monitor treatment for MRSA infections. Performed at [facility], [place] [address] [place],  [address]   Culture, sputum-assessment     Status: None   Collection Time: 12/31/18  5:56 PM   Specimen: Sputum  Result Value Ref Range Status   Specimen  Description SPU  Final   Special Requests NONE  Final   Sputum evaluation   Final    THIS SPECIMEN IS ACCEPTABLE FOR SPUTUM CULTURE Performed at [facility], [place] [address], [place], [place] [address]    Report Status 12/31/2018 FINAL  Final  Culture, respiratory     Status: None   Collection Time: 12/31/18  5:56 PM   Specimen: Sputum  Result Value Ref Range Status   Specimen Description   Final    SPU Performed at [facility] [address], [place], [place] [address]    Special Requests   Final    NONE Reflexed from [phone] Performed at [facility], [place] [address], [place], [place] [address]    Gram Stain   Final    FEW WBC PRESENT, PREDOMINANTLY PMN FEW GRAM POSITIVE COCCI IN PAIRS RARE GRAM NEGATIVE RODS RARE GRAM POSITIVE RODS Performed at [facility], [place] [address] [place], [place] [address]    Culture   Final    FEW PSEUDOMONAS AERUGINOSA FEW KLEBSIELLA PNEUMONIAE    Report Status 01/04/2019 FINAL  Final   Organism ID, Bacteria PSEUDOMONAS AERUGINOSA  Final   Organism ID, Bacteria KLEBSIELLA PNEUMONIAE  Final      Susceptibility   Klebsiella pneumoniae - MIC*    AMPICILLIN >=32 RESISTANT Resistant     CEFAZOLIN <=4 SENSITIVE Sensitive     CEFEPIME <=1 SENSITIVE Sensitive     CEFTAZIDIME <=1 SENSITIVE Sensitive     CEFTRIAXONE <=1 SENSITIVE Sensitive     CIPROFLOXACIN 1 SENSITIVE Sensitive     GENTAMICIN <=1 SENSITIVE Sensitive     IMIPENEM <=0.25 SENSITIVE Sensitive     TRIMETH/SULFA <=20 SENSITIVE Sensitive     AMPICILLIN/SULBACTAM 16 INTERMEDIATE Intermediate     PIP/TAZO 16 SENSITIVE Sensitive     Extended ESBL  NEGATIVE Sensitive     * FEW KLEBSIELLA PNEUMONIAE   Pseudomonas aeruginosa - MIC*    CEFTAZIDIME 8 SENSITIVE Sensitive     CIPROFLOXACIN >=4 RESISTANT Resistant     GENTAMICIN 4 SENSITIVE Sensitive     IMIPENEM 8 INTERMEDIATE Intermediate     PIP/TAZO 32 SENSITIVE Sensitive     CEFEPIME 16 INTERMEDIATE Intermediate     * FEW PSEUDOMONAS AERUGINOSA  SARS Coronavirus 2 [facility] order, Performed in [facility] hospital lab) Nasopharyngeal Nasopharyngeal Swab     Status: None   Collection Time: 01/08/19  7:46 PM   Specimen: Nasopharyngeal Swab  Result Value Ref Range Status   SARS Coronavirus 2 NEGATIVE NEGATIVE Final    Comment: (NOTE) If result is NEGATIVE SARS-CoV-2 target nucleic acids are NOT DETECTED. The SARS-CoV-2 RNA is generally detectable in upper and lower  respiratory specimens during the acute phase of infection. The lowest  concentration of SARS-CoV-2 viral copies this assay can detect is 250  copies / mL. A negative result does not preclude SARS-CoV-2 infection  and should not be used as the sole basis for treatment or other  patient management decisions.  A negative result may occur with  improper specimen collection / handling, submission of specimen other  than nasopharyngeal swab, presence of viral mutation(s) within the  areas targeted by this assay, and inadequate number of viral copies  (<250 copies / mL). A negative result must be combined with clinical  observations, patient history, and epidemiological information. If result is POSITIVE SARS-CoV-2 target nucleic acids are DETECTED. The SARS-CoV-2 RNA is generally detectable in upper and lower  respiratory  specimens dur ing the acute phase of infection.  Positive  results are indicative of active infection with SARS-CoV-2.  Clinical  correlation with patient history and other diagnostic information is  necessary to determine patient infection status.  Positive results do  not rule out bacterial infection or  co-infection with other viruses. If result is PRESUMPTIVE POSTIVE SARS-CoV-2 nucleic acids MAY BE PRESENT.   A presumptive positive result was obtained on the submitted specimen  and confirmed on repeat testing.  While 2019 novel coronavirus  (SARS-CoV-2) nucleic acids may be present in the submitted sample  additional confirmatory testing may be necessary for epidemiological  and / or clinical management purposes  to differentiate between  SARS-CoV-2 and other Sarbecovirus currently known to infect humans.  If clinically indicated additional testing with an alternate test  methodology [phone]) is advised. The SARS-CoV-2 RNA is generally  detectable in upper and lower respiratory sp ecimens during the acute  phase of infection. The expected result is Negative. Fact Sheet for Patients:  StrictlyIdeas.no Fact Sheet for Healthcare Providers: BankingDealers.co.za This test is not yet approved or cleared by the [place] FDA and has been authorized for detection and/or diagnosis of SARS-CoV-2 by FDA under an Emergency Use Authorization (EUA).  This EUA will remain in effect (meaning this test can be used) for the duration of the COVID-19 declaration under Section 564(b)(1) of the Act, 21 U.S.C. section 360bbb-3(b)(1), unless the authorization is terminated or revoked sooner. Performed at [facility], [place] [address] [place], [place] [address]   MRSA PCR Screening     Status: None   Collection Time: 01/09/19  1:31 AM   Specimen: Nasal Mucosa; Nasopharyngeal  Result Value Ref Range Status   MRSA by PCR NEGATIVE NEGATIVE Final    Comment:        The GeneXpert MRSA Assay (FDA approved for NASAL specimens only), is one component of a comprehensive MRSA colonization surveillance program. It is not intended to diagnose MRSA infection nor to guide or monitor treatment for MRSA infections. Performed at [facility], [place] [address], [place], [place] [address]      Radiology Studies: Dg Chest 2 View  Result Date: 01/08/2019 CLINICAL DATA:  Pt was admitted from 9/15-9/17 with an upper GI bleed. He underwent an endoscopy on 9/16. Sister reported that she was notified that pt had "abnormal abdominal results" from the gastrologist. EXAM: CHEST - 2 VIEW COMPARISON:  Chest radiograph 01/01/2019, 12/31/2018 FINDINGS: Tracheostomy in place. Stable cardiomediastinal contours. Central vascular congestion with diffuse bilateral prominent interstitial markings. Low volume study. Slightly increased opacity at the medial right lung base suspicious for infection. Scattered atelectasis no pneumothorax or large pleural effusion. No acute finding in the visualized skeleton. IMPRESSION: 1. Mildly increased opacity at the medial right lung base suspicious for infection. 2. Central vascular congestion with probable trace edema and atelectasis. Electronically Signed   By: Audie Pinto M.D.   On: 01/08/2019 18:41    Scheduled Meds:  amLODipine  10 mg Per Tube Daily   chlorhexidine  15 mL Mouth Rinse BID   Chlorhexidine Gluconate Cloth  6 each Topical Daily   [START ON 01/14/2019] cloNIDine  0.1 mg Transdermal Q Tue   gabapentin  100 mg Per Tube TID   glycopyrrolate  1 mg Per Tube BID   insulin aspart  0-9 Units Subcutaneous Q4H   mouth rinse  15 mL Mouth Rinse q12n4p   metoCLOPramide  5 mg Per Tube Q8H  metoprolol tartrate  50 mg Oral BID   mirtazapine  7.5 mg Per Tube QHS   montelukast  10 mg Per Tube Daily   pantoprazole (PROTONIX) IV  40 mg Intravenous Q12H   pravastatin  20 mg Per Tube Daily   Continuous Infusions:  feeding supplement (OSMOLITE 1.2 CAL)     piperacillin-tazobactam (ZOSYN)  IV 3.375 g (01/09/19 1325)     LOS: 1 day   Marylu Lund, MD [facility] Pager On Amion  If 7PM-7AM, please contact night-coverage 01/09/2019, 2:27 PM

## 2019-01-09 NOTE — Progress Notes (Signed)
Echocardiogram 2D Echocardiogram has been performed.  Oneal Deputy Zollie Clemence 01/09/2019, 10:50 AM

## 2019-01-09 NOTE — Progress Notes (Signed)
Dr. Roel Cluck had placed some orders by oral route and some by tube. Clarified with patient that all medications are given by tube. Paged Dr. Maudie Mercury to have all medications changed to by tube. He agreed. Called JC in pharmacy to change all orders to by tube.

## 2019-01-09 NOTE — ED Notes (Signed)
Per Nila Nephew, RN ICU request that Vancomycin be started and wait to bring pt up.

## 2019-01-09 NOTE — Consult Note (Signed)
[facility] Nurse wound consult note Reason for Consult: Old Pressure injury in chart from December 2019, carried over to this admission.  No current Pressure injury to sacrum. Call placed to ICU Charge RN (Bedside RN, Alexa is unable to come to phone due to patient care responsibilities).  Instructions provided to end date old pressure injury on Flow sheet and to ensure patient has a prophylactic foam dressing to the sacrum. The charge RN's assistance is appreciated.  [facility] nursing team will not follow, but will remain available to this patient, the nursing and medical teams.  Please re-consult if needed. Thanks, Maudie Flakes, MSN, RN, [place], Arther Abbott  Pager# [phone]

## 2019-01-09 NOTE — Telephone Encounter (Signed)
Noted  

## 2019-01-09 NOTE — Telephone Encounter (Signed)
Scott Rivera, Thank you for the update. Until he formally establishes with [place] in clinic he needs to follow-up with his primary GI team at [facility]. But agreed that in the setting of what was being described that he should have to the ED. I have reviewed the patient's chart and it looks like he is being evaluated for issues of the emesis as well as concern for potential progressive pneumonia. They can reach out to the inpatient GI service if he remains a patient into the morning. We need to be absolutely sure that this patient has a clinic visit before we make any decisions moving forward about his care as it is very hard to continue to have multiple providers and make decisions for patient. As such, this is documentation that until he is formally seen in GI clinic at [facility], should they choose to transition his care from his GI team that knows him, then we are not going to be giving the patient significant advices or changes or medication adjustments. Thanks. GM

## 2019-01-10 LAB — CBC
HCT: 35 % — ABNORMAL LOW (ref 39.0–52.0)
Hemoglobin: 10.5 g/dL — ABNORMAL LOW (ref 13.0–17.0)
MCH: 29.8 pg (ref 26.0–34.0)
MCHC: 30 g/dL (ref 30.0–36.0)
MCV: 99.4 fL (ref 80.0–100.0)
Platelets: 394 10*3/uL (ref 150–400)
RBC: 3.52 MIL/uL — ABNORMAL LOW (ref 4.22–5.81)
RDW: 14.3 % (ref 11.5–15.5)
WBC: 9.1 10*3/uL (ref 4.0–10.5)
nRBC: 0 % (ref 0.0–0.2)

## 2019-01-10 LAB — GLUCOSE, CAPILLARY
Glucose-Capillary: 101 mg/dL — ABNORMAL HIGH (ref 70–99)
Glucose-Capillary: 110 mg/dL — ABNORMAL HIGH (ref 70–99)
Glucose-Capillary: 119 mg/dL — ABNORMAL HIGH (ref 70–99)
Glucose-Capillary: 119 mg/dL — ABNORMAL HIGH (ref 70–99)
Glucose-Capillary: 134 mg/dL — ABNORMAL HIGH (ref 70–99)
Glucose-Capillary: 95 mg/dL (ref 70–99)

## 2019-01-10 LAB — HEMOGLOBIN A1C
Hgb A1c MFr Bld: 5.2 % (ref 4.8–5.6)
Mean Plasma Glucose: 103 mg/dL

## 2019-01-10 LAB — COMPREHENSIVE METABOLIC PANEL
ALT: 11 U/L (ref 0–44)
AST: 14 U/L — ABNORMAL LOW (ref 15–41)
Albumin: 3 g/dL — ABNORMAL LOW (ref 3.5–5.0)
Alkaline Phosphatase: 61 U/L (ref 38–126)
Anion gap: 8 (ref 5–15)
BUN: 24 mg/dL — ABNORMAL HIGH (ref 8–23)
CO2: 26 mmol/L (ref 22–32)
Calcium: 9.2 mg/dL (ref 8.9–10.3)
Chloride: 105 mmol/L (ref 98–111)
Creatinine, Ser: 1.19 mg/dL (ref 0.61–1.24)
GFR calc Af Amer: >60 mL/min (ref >60)
GFR calc non Af Amer: >60 mL/min (ref >60)
Glucose, Bld: 103 mg/dL — ABNORMAL HIGH (ref 70–99)
Potassium: 4.1 mmol/L (ref 3.5–5.1)
Sodium: 139 mmol/L (ref 135–145)
Total Bilirubin: 0.5 mg/dL (ref 0.3–1.2)
Total Protein: 6.7 g/dL (ref 6.5–8.1)

## 2019-01-10 LAB — LEGIONELLA PNEUMOPHILA SEROGP 1 UR AG: L. pneumophila Serogp 1 Ur Ag: NEGATIVE

## 2019-01-10 MED ORDER — AMOXICILLIN-POT CLAVULANATE 400-57 MG/5ML PO SUSR
400.0000 mg | Freq: Two times a day (BID) | ORAL | Status: DC
  Filled 2019-01-10: qty 5

## 2019-01-10 MED ORDER — AMOXICILLIN-POT CLAVULANATE 400-57 MG/5ML PO SUSR
800.0000 mg | Freq: Two times a day (BID) | ORAL | Status: DC
  Administered 2019-01-11 (×2): 800 mg
  Filled 2019-01-10 (×3): qty 10

## 2019-01-10 NOTE — Progress Notes (Signed)
PROGRESS NOTE    Scott Rivera  [id number] DOB: 23-Nov-1950 DOA: 01/08/2019 PCP: [facility]    Brief Narrative:  68 y.o. male with medical history significant of CVA sp trach, G-tube dependent,  Chronic Indwelling Foley Catheter, decubitus ulcers,  HLD, HTN,    Admitted for recurrent aspiration pneumonia, questionable UTI, coffee-ground emesis  Assessment & Plan:   Active Problems:   Essential hypertension   History of cerebral infarction   Pressure injury of skin   Dysphagia, post-stroke   Diabetes mellitus (HCC)   CKD (chronic kidney disease) stage 2, GFR 60-89 ml/min   Mixed hyperlipidemia   Anemia due to chronic kidney disease   History of tracheostomy   Acute respiratory failure with hypoxia (HCC)   Foley catheter in place on admission   Aspiration pneumonia ([place])   HCAP (healthcare-associated pneumonia)   Hyperkalemia   Present on Admission: . HCAP (healthcare-associated pneumonia) vs Aspiration pneumonia ([place]) - -Noted to have worsening imaging from prior admission as well as worsening hypoxia with increased mucus production suggest recurrent pneumonia -Prior culture results demonstrated pseudomonas species sensitive to zosyn, improving with zosyn. Will transition to augmentin -MRSA screen neg. Vanc d/c'd as MRSA pneumonia is very unlikely  Possible UTI  -Urine culture pending -Continued on empiric zosyn, transition to augmentin  Brown emesis - -GI has been consulted by ER provider, report brown emesis most likely due to known esophagitis - Continue on PPI as tolerated -GI has recommended trial of q8hrs reglan, clinically improving  . CKD (chronic kidney disease) stage 2, GFR 60-89 ml/min  -Cont to avoid nephrotoxic medications -Recheck BMET in AM  Hyperkalemia  -Normalized -Will repeat bmet in AM  . Mixed hyperlipidemia  -remains stable at this time - continue on home medications  . Anemia due to chronic kidney disease   -currently stable continue to monitor -Stable at this time. Repeat CBC in AM  . Acute respiratory failure with hypoxia (HCC)  -worsening hypoxia in the setting of aspiration pneumonia continue trach care admit to stepdown given need for advanced nursing care and frequent suctioning -Remains with very coarse breath sounds  Dm -  - Order Sensitive  SSI   - TSH of 3.942 -HgA1C 5.2   . Essential hypertension  -BP stable at this time  . hx of Pressure injury of skin, resolved - wound care consulted with no pressure injury to sacrum noted -Instructions provided to add end-date of old pressure injury from 2019 - stable this time  DVT prophylaxis: SCD's Code Status: Full Family Communication: Pt in room, family not at bedside Disposition Plan: Uncertain at this time  Consultants:   GI  Procedures:     Antimicrobials: Anti-infectives (From admission, onward)   Start     Dose/Rate Route Frequency Ordered Stop   01/10/19 1515  amoxicillin-clavulanate (AUGMENTIN) 400-57 MG/5ML suspension 400 mg     400 mg Per Tube Every 12 hours 01/10/19 1511     01/09/19 1800  vancomycin (VANCOCIN) IVPB 750 mg/150 ml premix  Status:  Discontinued     750 mg 150 mL/hr over 60 Minutes Intravenous Every 12 hours 01/09/19 0558 01/09/19 1003   01/09/19 0600  piperacillin-tazobactam (ZOSYN) IVPB 3.375 g  Status:  Discontinued     3.375 g 12.5 mL/hr over 240 Minutes Intravenous Every 8 hours 01/09/19 0153 01/10/19 1511   01/08/19 2015  ceFEPIme (MAXIPIME) 1 g in sodium chloride 0.9 % 100 mL IVPB  Status:  Discontinued     1 g  200 mL/hr over 30 Minutes Intravenous  Once 01/08/19 1944 01/08/19 1954   01/08/19 2000  vancomycin (VANCOCIN) 1,500 mg in sodium chloride 0.9 % 500 mL IVPB     1,500 mg 250 mL/hr over 120 Minutes Intravenous  Once 01/08/19 1950 01/09/19 0111   01/08/19 2000  ceFEPIme (MAXIPIME) 2 g in sodium chloride 0.9 % 100 mL IVPB     2 g 200 mL/hr over 30 Minutes Intravenous  Once  01/08/19 1954 01/09/19 0027      Subjective: Cannot obtain as pt has trach  Objective: Vitals:   01/10/19 0745 01/10/19 0800 01/10/19 1006 01/10/19 1115  BP:  (!) 121/52 (!) 154/51   Pulse: 73 70 74 69  Resp: 14 12  16   Temp:  99.1 F (37.3 C)    TempSrc:      SpO2: 96% 94%  97%  Weight:      Height:        Intake/Output Summary (Last 24 hours) at 01/10/2019 1516 Last data filed at 01/10/2019 0800 Gross per 24 hour  Intake 606.86 ml  Output 750 ml  Net -143.14 ml   Filed Weights   01/09/19 0115  Weight: 77 kg    Examination: General exam: Awake, laying in bed, in nad Respiratory system: Normal respiratory effort, no wheezing, trach in place Cardiovascular system: regular rate, s1, s2 Gastrointestinal system: Soft, nondistended, positive BS Central nervous system: CN2-12 grossly intact, strength intact Extremities: Perfused, no clubbing Skin: Normal skin turgor, no notable skin lesions seen Psychiatry: Unable to assess as pt has trach  Data Reviewed: I have personally reviewed following labs and imaging studies  CBC: Recent Labs  Lab 01/08/19 1916 01/09/19 0235 01/10/19 0231  WBC 13.7* 11.4* 9.1  NEUTROABS 9.5*  --   --   HGB 11.7* 11.0* 10.5*  HCT 38.7* 37.6* 35.0*  MCV 97.7 99.7 99.4  PLT 390 401* [id number]   Basic Metabolic Panel: Recent Labs  Lab 01/08/19 1916 01/09/19 0152 01/09/19 0235 01/10/19 0231  NA 137 139 138 139  K 6.2* 4.6 4.3 4.1  CL 101 103 102 105  CO2 27 27 28 26   GLUCOSE 86 71 71 103*  BUN 29* 31* 31* 24*  CREATININE 1.17 1.09 1.12 1.19  CALCIUM 9.4 9.5 9.3 9.2  MG  --   --  2.2  --   PHOS  --   --  3.6  --    GFR: Estimated Creatinine Clearance: 58.1 mL/min (by C-G formula based on SCr of 1.19 mg/dL). Liver Function Tests: Recent Labs  Lab 01/08/19 1916 01/09/19 0235 01/10/19 0231  AST 28 14* 14*  ALT 10 13 11   ALKPHOS 72 70 61  BILITOT 0.9 0.4 0.5  PROT 7.6 7.2 6.7  ALBUMIN 3.2* 3.1* 3.0*   Recent Labs  Lab  01/08/19 1916  LIPASE 20   No results for input(s): AMMONIA in the last 168 hours. Coagulation Profile: No results for input(s): INR, PROTIME in the last 168 hours. Cardiac Enzymes: No results for input(s): CKTOTAL, CKMB, CKMBINDEX, TROPONINI in the last 168 hours. BNP (last 3 results) No results for input(s): PROBNP in the last 8760 hours. HbA1C: Recent Labs    01/09/19 0152  HGBA1C 5.2   CBG: Recent Labs  Lab 01/09/19 1939 01/09/19 2358 01/10/19 0305 01/10/19 0749 01/10/19 1135  GLUCAP 98 119* 95 134* 110*   Lipid Profile: No results for input(s): CHOL, HDL, LDLCALC, TRIG, CHOLHDL, LDLDIRECT in the last 72 hours. Thyroid Function Tests: Recent  Labs    01/09/19 0235  TSH 3.942   Anemia Panel: No results for input(s): VITAMINB12, FOLATE, FERRITIN, TIBC, IRON, RETICCTPCT in the last 72 hours. Sepsis Labs: No results for input(s): PROCALCITON, LATICACIDVEN in the last 168 hours.  Recent Results (from the past 240 hour(s))  MRSA PCR Screening     Status: None   Collection Time: 12/31/18  4:54 PM   Specimen: Nasopharyngeal  Result Value Ref Range Status   MRSA by PCR NEGATIVE NEGATIVE Final    Comment:        The GeneXpert MRSA Assay (FDA approved for NASAL specimens only), is one component of a comprehensive MRSA colonization surveillance program. It is not intended to diagnose MRSA infection nor to guide or monitor treatment for MRSA infections. Performed at [facility], [place] [address], [place], [place] [address]   Culture, sputum-assessment     Status: None   Collection Time: 12/31/18  5:56 PM   Specimen: Sputum  Result Value Ref Range Status   Specimen Description SPU  Final   Special Requests NONE  Final   Sputum evaluation   Final    THIS SPECIMEN IS ACCEPTABLE FOR SPUTUM CULTURE Performed at [facility], [place] [address], [place], [place] [address]    Report Status 12/31/2018 FINAL  Final  Culture,  respiratory     Status: None   Collection Time: 12/31/18  5:56 PM   Specimen: Sputum  Result Value Ref Range Status   Specimen Description   Final    SPU Performed at [place] [address], [place], [place] [address]    Special Requests   Final    NONE Reflexed from [id number] Performed at [facility], [place] [address], [place], [place] [address]    Gram Stain   Final    FEW WBC PRESENT, PREDOMINANTLY PMN FEW GRAM POSITIVE COCCI IN PAIRS RARE GRAM NEGATIVE RODS RARE GRAM POSITIVE RODS Performed at [facility], [place] [address], [place], [place] [address]    Culture   Final    FEW PSEUDOMONAS AERUGINOSA FEW KLEBSIELLA PNEUMONIAE    Report Status 01/04/2019 FINAL  Final   Organism ID, Bacteria PSEUDOMONAS AERUGINOSA  Final   Organism ID, Bacteria KLEBSIELLA PNEUMONIAE  Final      Susceptibility   Klebsiella pneumoniae - MIC*    AMPICILLIN >=32 RESISTANT Resistant     CEFAZOLIN <=4 SENSITIVE Sensitive     CEFEPIME <=1 SENSITIVE Sensitive     CEFTAZIDIME <=1 SENSITIVE Sensitive     CEFTRIAXONE <=1 SENSITIVE Sensitive     CIPROFLOXACIN 1 SENSITIVE Sensitive     GENTAMICIN <=1 SENSITIVE Sensitive     IMIPENEM <=0.25 SENSITIVE Sensitive     TRIMETH/SULFA <=20 SENSITIVE Sensitive     AMPICILLIN/SULBACTAM 16 INTERMEDIATE Intermediate     PIP/TAZO 16 SENSITIVE Sensitive     Extended ESBL NEGATIVE Sensitive     * FEW KLEBSIELLA PNEUMONIAE   Pseudomonas aeruginosa - MIC*    CEFTAZIDIME 8 SENSITIVE Sensitive     CIPROFLOXACIN >=4 RESISTANT Resistant     GENTAMICIN 4 SENSITIVE Sensitive     IMIPENEM 8 INTERMEDIATE Intermediate     PIP/TAZO 32 SENSITIVE Sensitive     CEFEPIME 16 INTERMEDIATE Intermediate     * FEW PSEUDOMONAS AERUGINOSA  Urine culture     Status: Abnormal (Preliminary result)   Collection Time: 01/08/19  6:54 PM   Specimen: Urine, Random  Result Value Ref Range Status  Specimen Description   Final    URINE, RANDOM  Performed at [facility], [place] [address] [place], [place] [address]    Special Requests   Final    NONE Performed at [facility], [place] [address], [place], [place] [address]    Culture 40,000 COLONIES/mL YEAST (A)  Final   Report Status PENDING  Incomplete  Blood culture (routine x 2)     Status: None (Preliminary result)   Collection Time: 01/08/19  7:45 PM   Specimen: BLOOD  Result Value Ref Range Status   Specimen Description   Final    BLOOD BLOOD RIGHT FOREARM Performed at [place] [address] [place], [place] [address]    Special Requests   Final    BOTTLES DRAWN AEROBIC AND ANAEROBIC Blood Culture adequate volume Performed at [place] [address], [place], [place] [address]    Culture   Final    NO GROWTH 1 DAY Performed at [facility], [place] [address], [place], [place] [address]    Report Status PENDING  Incomplete  SARS Coronavirus 2 [facility] order, Performed in [facility] hospital lab) Nasopharyngeal Nasopharyngeal Swab     Status: None   Collection Time: 01/08/19  7:46 PM   Specimen: Nasopharyngeal Swab  Result Value Ref Range Status   SARS Coronavirus 2 NEGATIVE NEGATIVE Final    Comment: (NOTE) If result is NEGATIVE SARS-CoV-2 target nucleic acids are NOT DETECTED. The SARS-CoV-2 RNA is generally detectable in upper and lower  respiratory specimens during the acute phase of infection. The lowest  concentration of SARS-CoV-2 viral copies this assay can detect is 250  copies / mL. A negative result does not preclude SARS-CoV-2 infection  and should not be used as the sole basis for treatment or other  patient management decisions.  A negative result may occur with  improper specimen collection / handling, submission of specimen other  than nasopharyngeal swab, presence of viral mutation(s) within the  areas targeted by this assay, and inadequate number of viral  copies  (<250 copies / mL). A negative result must be combined with clinical  observations, patient history, and epidemiological information. If result is POSITIVE SARS-CoV-2 target nucleic acids are DETECTED. The SARS-CoV-2 RNA is generally detectable in upper and lower  respiratory specimens dur ing the acute phase of infection.  Positive  results are indicative of active infection with SARS-CoV-2.  Clinical  correlation with patient history and other diagnostic information is  necessary to determine patient infection status.  Positive results do  not rule out bacterial infection or co-infection with other viruses. If result is PRESUMPTIVE POSTIVE SARS-CoV-2 nucleic acids MAY BE PRESENT.   A presumptive positive result was obtained on the submitted specimen  and confirmed on repeat testing.  While 2019 novel coronavirus  (SARS-CoV-2) nucleic acids may be present in the submitted sample  additional confirmatory testing may be necessary for epidemiological  and / or clinical management purposes  to differentiate between  SARS-CoV-2 and other Sarbecovirus currently known to infect humans.  If clinically indicated additional testing with an alternate test  methodology [phone]) is advised. The SARS-CoV-2 RNA is generally  detectable in upper and lower respiratory sp ecimens during the acute  phase of infection. The expected result is Negative. Fact Sheet for Patients:  StrictlyIdeas.no Fact Sheet for Healthcare Providers: BankingDealers.co.za This test is not yet approved or cleared by the [place] FDA and has been authorized  for detection and/or diagnosis of SARS-CoV-2 by FDA under an Emergency Use Authorization (EUA).  This EUA will remain in effect (meaning this test can be used) for the duration of the COVID-19 declaration under Section 564(b)(1) of the Act, 21 U.S.C. section 360bbb-3(b)(1), unless the authorization is terminated  or revoked sooner. Performed at [facility], [place] [address] [place], [place] [address]   MRSA PCR Screening     Status: None   Collection Time: 01/09/19  1:31 AM   Specimen: Nasal Mucosa; Nasopharyngeal  Result Value Ref Range Status   MRSA by PCR NEGATIVE NEGATIVE Final    Comment:        The GeneXpert MRSA Assay (FDA approved for NASAL specimens only), is one component of a comprehensive MRSA colonization surveillance program. It is not intended to diagnose MRSA infection nor to guide or monitor treatment for MRSA infections. Performed at [facility], [place] [address] [place], [place] [address]   Blood culture (routine x 2)     Status: None (Preliminary result)   Collection Time: 01/09/19  1:52 AM   Specimen: BLOOD RIGHT HAND  Result Value Ref Range Status   Specimen Description   Final    BLOOD RIGHT HAND Performed at [place] [address], [place], [place] [address]    Special Requests   Final    BOTTLES DRAWN AEROBIC ONLY Blood Culture adequate volume Performed at [place] [address], [place], [place] [address]    Culture   Final    NO GROWTH 1 DAY Performed at [facility], [place] [address] [place], [place] [address]    Report Status PENDING  Incomplete     Radiology Studies: Dg Chest 2 View  Result Date: 01/08/2019 CLINICAL DATA:  Pt was admitted from 9/15-9/17 with an upper GI bleed. He underwent an endoscopy on 9/16. Sister reported that she was notified that pt had "abnormal abdominal results" from the gastrologist. EXAM: CHEST - 2 VIEW COMPARISON:  Chest radiograph 01/01/2019, 12/31/2018 FINDINGS: Tracheostomy in place. Stable cardiomediastinal contours. Central vascular congestion with diffuse bilateral prominent interstitial markings. Low volume study. Slightly increased opacity at the medial right lung base suspicious for infection. Scattered atelectasis no  pneumothorax or large pleural effusion. No acute finding in the visualized skeleton. IMPRESSION: 1. Mildly increased opacity at the medial right lung base suspicious for infection. 2. Central vascular congestion with probable trace edema and atelectasis. Electronically Signed   By: Audie Pinto M.D.   On: 01/08/2019 18:41    Scheduled Meds: . amLODipine  10 mg Per Tube Daily  . amoxicillin-clavulanate  400 mg Per Tube Q12H  . chlorhexidine  15 mL Mouth Rinse BID  . Chlorhexidine Gluconate Cloth  6 each Topical Daily  . [START ON 01/14/2019] cloNIDine  0.1 mg Transdermal Q Tue  . gabapentin  100 mg Per Tube TID  . glycopyrrolate  1 mg Per Tube BID  . insulin aspart  0-9 Units Subcutaneous Q4H  . mouth rinse  15 mL Mouth Rinse q12n4p  . metoCLOPramide  5 mg Per Tube Q8H  . metoprolol tartrate  50 mg Oral BID  . mirtazapine  7.5 mg Per Tube QHS  . montelukast  10 mg Per Tube Daily  . pantoprazole (PROTONIX) IV  40 mg Intravenous Q12H  . pravastatin  20 mg Per Tube Daily   Continuous Infusions: . feeding supplement (OSMOLITE 1.2 CAL) 35 mL/hr at 01/10/19 0300  LOS: 2 days   Marylu Lund, MD [facility] Pager On Amion  If 7PM-7AM, please contact night-coverage 01/10/2019, 3:16 PM

## 2019-01-10 NOTE — Progress Notes (Signed)
Pharmacy: Abx dose adjustment  Augmentin 400 mg per tube BID >> dose increased to 800 mg per tube BID for CrCl > 30  Eudelia Bunch, Pharm.D [phone] 01/10/2019 7:26 PM

## 2019-01-11 LAB — BASIC METABOLIC PANEL
Anion gap: 8 (ref 5–15)
BUN: 16 mg/dL (ref 8–23)
CO2: 28 mmol/L (ref 22–32)
Calcium: 9.7 mg/dL (ref 8.9–10.3)
Chloride: 104 mmol/L (ref 98–111)
Creatinine, Ser: 0.91 mg/dL (ref 0.61–1.24)
GFR calc Af Amer: >60 mL/min (ref >60)
GFR calc non Af Amer: >60 mL/min (ref >60)
Glucose, Bld: 85 mg/dL (ref 70–99)
Potassium: 5.3 mmol/L — ABNORMAL HIGH (ref 3.5–5.1)
Sodium: 140 mmol/L (ref 135–145)

## 2019-01-11 LAB — URINE CULTURE: Culture: 40000 — AB

## 2019-01-11 LAB — COMPREHENSIVE METABOLIC PANEL
ALT: 10 U/L (ref 0–44)
AST: 18 U/L (ref 15–41)
Albumin: 3.4 g/dL — ABNORMAL LOW (ref 3.5–5.0)
Alkaline Phosphatase: 72 U/L (ref 38–126)
Anion gap: 10 (ref 5–15)
BUN: 17 mg/dL (ref 8–23)
CO2: 26 mmol/L (ref 22–32)
Calcium: 9.7 mg/dL (ref 8.9–10.3)
Chloride: 104 mmol/L (ref 98–111)
Creatinine, Ser: 0.96 mg/dL (ref 0.61–1.24)
GFR calc Af Amer: >60 mL/min (ref >60)
GFR calc non Af Amer: >60 mL/min (ref >60)
Glucose, Bld: 113 mg/dL — ABNORMAL HIGH (ref 70–99)
Potassium: 5.7 mmol/L — ABNORMAL HIGH (ref 3.5–5.1)
Sodium: 140 mmol/L (ref 135–145)
Total Bilirubin: 0.1 mg/dL — ABNORMAL LOW (ref 0.3–1.2)
Total Protein: 7.7 g/dL (ref 6.5–8.1)

## 2019-01-11 LAB — CBC
HCT: 38 % — ABNORMAL LOW (ref 39.0–52.0)
Hemoglobin: 11.7 g/dL — ABNORMAL LOW (ref 13.0–17.0)
MCH: 29.3 pg (ref 26.0–34.0)
MCHC: 30.8 g/dL (ref 30.0–36.0)
MCV: 95.2 fL (ref 80.0–100.0)
Platelets: 297 10*3/uL (ref 150–400)
RBC: 3.99 MIL/uL — ABNORMAL LOW (ref 4.22–5.81)
RDW: 14.2 % (ref 11.5–15.5)
WBC: 8.9 10*3/uL (ref 4.0–10.5)
nRBC: 0 % (ref 0.0–0.2)

## 2019-01-11 LAB — GLUCOSE, CAPILLARY
Glucose-Capillary: 95 mg/dL (ref 70–99)
Glucose-Capillary: 94 mg/dL (ref 70–99)
Glucose-Capillary: 99 mg/dL (ref 70–99)
Glucose-Capillary: 98 mg/dL (ref 70–99)

## 2019-01-11 MED ORDER — SODIUM CHLORIDE 0.9 % IV BOLUS
250.0000 mL | Freq: Once | INTRAVENOUS | Status: AC
  Administered 2019-01-11: 250 mL via INTRAVENOUS

## 2019-01-11 MED ORDER — METOCLOPRAMIDE HCL 10 MG/10ML PO SOLN: 5.0000 mg | Freq: Three times a day (TID) | ORAL | 0 refills | Status: DC

## 2019-01-11 MED ORDER — SODIUM POLYSTYRENE SULFONATE 15 GM/60ML PO SUSP
15.0000 g | Freq: Once | ORAL | Status: AC
  Administered 2019-01-11: 15 g via ORAL
  Filled 2019-01-11: qty 60

## 2019-01-11 MED ORDER — METOPROLOL TARTRATE 25 MG/10 ML ORAL SUSPENSION
50.0000 mg | Freq: Two times a day (BID) | ORAL | Status: DC
  Administered 2019-01-11: 50 mg
  Filled 2019-01-11 (×2): qty 20

## 2019-01-11 MED ORDER — AMOXICILLIN-POT CLAVULANATE 400-57 MG/5ML PO SUSR: 800.0000 mg | Freq: Two times a day (BID) | ORAL | 0 refills | Status: DC

## 2019-01-11 NOTE — Discharge Summary (Signed)
Physician Discharge Summary  RAYEN SIRI [id number] DOB: 1950/12/29 DOA: 01/08/2019  PCP: [facility]  Admit date: 01/08/2019 Discharge date: 01/11/2019  Admitted From: Home Disposition:  Home  Recommendations for Outpatient Follow-up:  1. Follow up with PCP in 1-2 weeks 2. Please obtain BMP in one week  Home Health:RN    Discharge Condition:Improved CODE STATUS:Full Diet recommendation: Diabetes   Brief/Interim Summary: 68 y.o.malewith medical history significant of CVA sp trach, G-tube dependent,Chronic Indwelling Foley Catheter, decubitus ulcers, HLD, HTN,   Admitted for recurrent aspiration pneumonia, questionable UTI, coffee-ground emesis  Discharge Diagnoses:  Active Problems:   Essential hypertension   History of cerebral infarction   Pressure injury of skin   Dysphagia, post-stroke   Diabetes mellitus (HCC)   CKD (chronic kidney disease) stage 2, GFR 60-89 ml/min   Mixed hyperlipidemia   Anemia due to chronic kidney disease   History of tracheostomy   Acute respiratory failure with hypoxia (HCC)   Foley catheter in place on admission   Aspiration pneumonia ([place])   HCAP (healthcare-associated pneumonia)   Hyperkalemia  Present on Admission: .HCAP  -(healthcare-associated pneumonia)vsAspiration pneumonia ([place]) -Noted to have worsening imaging from prior admission as well as worsening hypoxia with increased mucus production suggest recurrent pneumonia -Prior culture results demonstrated pseudomonas species sensitive to zosyn, improving with zosyn. Have transitioned to augmentin to complete course -MRSA screen neg. Vanc d/c'd as MRSA pneumonia is very unlikely  Possible UTI  -Urine culture pending -Initially continued on empiric zosyn, transition to augmentin  Brown emesis- -GI has been consulted by ER provider,report brown emesis most likely due to known esophagitis -Continued on PPI as tolerated -GI has recommended trial of  q8hrs reglan, clinically improved  .CKD(chronic kidney disease) stage 2, GFR 60-89 ml/min -Cont to avoid nephrotoxic medications  Hyperkalemia -Normalized, however trended [place] slilghtly -Given kayexelate with IVF bolus, will recommend repeat BMET in one week  .Mixed hyperlipidemia -remains stable at this time - continue on home medications  .Anemia due to chronic kidney disease  -currently stable  .Acute respiratory failure with hypoxia(HCC)  -worsening hypoxia in the setting of aspiration pneumonia continue trach care  -Now much improved and near baseline  Dm - - Order Sensitive SSI  - TSH of 3.942 -HgA1C 5.2  .Essential hypertension -BP stable at this time  .hx ofPressure injury of skin, resolved - wound care consulted with no pressure injury to sacrum noted -Instructions provided to add end-date of old pressure injury from 2019 - stable this time  Discharge Instructions   Allergies as of 01/11/2019   No Known Allergies     Medication List    TAKE these medications   acetaminophen 500 MG tablet Commonly known as: TYLENOL Place 1,000 mg into feeding tube 3 (three) times daily.   amLODipine 10 MG tablet Commonly known as: NORVASC Place 1 tablet (10 mg total) into feeding tube daily.   amoxicillin-clavulanate 400-57 MG/5ML suspension Commonly known as: AUGMENTIN Place 10 mLs (800 mg total) into feeding tube every 12 (twelve) hours for 3 days. Start taking on: January 12, 2019   cloNIDine 0.1 mg/24hr patch Commonly known as: CATAPRES - Dosed in mg/24 hr Place 1 patch (0.1 mg total) onto the skin every Tuesday.   clopidogrel 75 MG tablet Commonly known as: PLAVIX Place 1 tablet (75 mg total) into feeding tube daily.   feeding supplement (JEVITY 1.2 CAL) Liqd Initiate at 35 ml/hr and increase by 10 ml every 8 hours to reach goal rate of 65  ml/hr.   free water Soln Place 100 mLs into feeding tube every 3 (three) hours.    gabapentin 100 MG capsule Commonly known as: NEURONTIN Take 100 mg by mouth 3 (three) times daily. Open 1 capsule (100 mg) and administer contents via feeding tube three times daily   glycopyrrolate 1 MG tablet Commonly known as: ROBINUL Place 1 mg into feeding tube 2 (two) times daily.   Iron 325 (65 Fe) MG Tabs Place 325 mg into feeding tube daily.   metoCLOPramide 10 MG/10ML Soln Commonly known as: REGLAN Place 5 mLs (5 mg total) into feeding tube every 8 (eight) hours.   metoprolol tartrate 50 MG tablet Commonly known as: LOPRESSOR Take 1 tablet (50 mg total) by mouth 2 (two) times daily.   mirtazapine 15 MG tablet Commonly known as: REMERON Take 7.5 mg by mouth at bedtime.   montelukast 10 MG tablet Commonly known as: SINGULAIR Place 10 mg into feeding tube daily.   ondansetron 4 MG tablet Commonly known as: ZOFRAN Place 1 tablet (4 mg total) into feeding tube every 6 (six) hours as needed for nausea.   pantoprazole sodium 40 mg/20 mL Pack Commonly known as: PROTONIX Place 20 mLs (40 mg total) into feeding tube 2 (two) times daily.   pravastatin 20 MG tablet Commonly known as: PRAVACHOL Place 1 tablet (20 mg total) into feeding tube daily at 6 PM. What changed: when to take this   scopolamine 1 MG/3DAYS Commonly known as: TRANSDERM-SCOP Place 1 mg onto the skin every 3 (three) days.   vitamin C 500 MG tablet Commonly known as: ASCORBIC ACID Take 500 mg by mouth daily.      Follow-up [place]. Schedule an appointment as soon as possible for a visit in 1 week(s).   Contact information: [place] [address] [phone]          No Known Allergies  Consultations:  GI  Procedures/Studies: Dg Chest 2 View  Result Date: 01/08/2019 CLINICAL DATA:  Pt was admitted from 9/15-9/17 with an upper GI bleed. He underwent an endoscopy on 9/16. Sister reported that she was notified that pt had "abnormal  abdominal results" from the gastrologist. EXAM: CHEST - 2 VIEW COMPARISON:  Chest radiograph 01/01/2019, 12/31/2018 FINDINGS: Tracheostomy in place. Stable cardiomediastinal contours. Central vascular congestion with diffuse bilateral prominent interstitial markings. Low volume study. Slightly increased opacity at the medial right lung base suspicious for infection. Scattered atelectasis no pneumothorax or large pleural effusion. No acute finding in the visualized skeleton. IMPRESSION: 1. Mildly increased opacity at the medial right lung base suspicious for infection. 2. Central vascular congestion with probable trace edema and atelectasis. Electronically Signed   By: Audie Pinto M.D.   On: 01/08/2019 18:41   Dg Abdomen Peg Tube Location  Result Date: 12/31/2018 CLINICAL DATA:  68 year old male with PEG tube. Evaluate for patency. EXAM: ABDOMEN - 1 VIEW COMPARISON:  Abdominal radiograph dated 11/18/2018 FINDINGS: Contrast injected via the percutaneous gastrostomy is noted within the stomach and duodenal C-loop. No extraluminal contrast identified. There is large amount of stool throughout the colon. No bowel dilatation. There is degenerative changes of the spine. Midline surgical staples noted. IMPRESSION: Percutaneous gastrostomy within the distal stomach. No extraluminal contrast identified. Electronically Signed   By: Anner Crete M.D.   On: 12/31/2018 09:44   Dg Chest Port 1 View  Result Date: 01/01/2019 CLINICAL DATA:  Pneumonia. EXAM: PORTABLE CHEST 1 VIEW COMPARISON:  Chest  x-ray from yesterday. FINDINGS: Unchanged tracheostomy tube with tip at the thoracic inlet. The heart size and mediastinal contours are within normal limits. New mild pulmonary vascular congestion. Unchanged opacity at the medial right lung base. No pleural effusion or pneumothorax. No acute osseous abnormality. 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 IMPRESSION: 1. New mild pulmonary vascular congestion.  2. Unchanged right medial basilar atelectasis versus infiltrate. Electronically Signed   By: Titus Dubin M.D.   On: 01/01/2019 08:20   Dg Chest Portable 1 View  Result Date: 12/31/2018 CLINICAL DATA:  Increased congestion. EXAM: PORTABLE CHEST 1 VIEW COMPARISON:  11/24/2018. FINDINGS: Tracheostomy tube stable position. Heart size normal. Low lung volumes with mild bibasilar atelectasis/infiltrates. No pleural effusion or pneumothorax. Surgical clips left upper scratched it surgical clips left chest. 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 IMPRESSION: 1.  Tracheostomy tube stable position. 2.  Low lung volumes with mild bibasilar atelectasis/infiltrates. 3. Sclerotic changes left humeral head consistent with avascular necrosis. Electronically Signed   By: Marcello Moores  Register   On: 12/31/2018 06:25     Subjective: Unable to assess given trach  Discharge Exam: Vitals:   01/11/19 1104 01/11/19 1357  BP:  (!) 148/76  Pulse: 80 79  Resp: 20 20  Temp:  98.3 F (36.8 C)  SpO2: 95% 96%   Vitals:   01/11/19 0724 01/11/19 1028 01/11/19 1104 01/11/19 1357  BP:  127/78  (!) 148/76  Pulse: 79 84 80 79  Resp: 18 20 20 20   Temp:  98.9 F (37.2 C)  98.3 F (36.8 C)  TempSrc:  Oral  Oral  SpO2: 98% 95% 95% 96%  Weight:      Height:        General: Pt is alert, awake, not in acute distress Cardiovascular: RRR, S1/S2 +, no rubs, no gallops Respiratory: CTA bilaterally, no wheezing, no rhonchi Abdominal: Soft, NT, ND, bowel sounds + Extremities: no edema, no cyanosis   The results of significant diagnostics from this hospitalization (including imaging, microbiology, ancillary and laboratory) are listed below for reference.     Microbiology: Recent Results (from the past 240 hour(s))  Urine culture     Status: Abnormal   Collection Time: 01/08/19  6:54 PM   Specimen: Urine, Random  Result Value Ref Range Status   Specimen Description   Final    URINE,  RANDOM Performed at [facility] [address] [place], [place] [address]    Special Requests   Final    NONE Performed at [facility], [place] [address] [place], [place] [address]    Culture 40,000 COLONIES/mL YEAST (A)  Final   Report Status 01/11/2019 FINAL  Final  Blood culture (routine x 2)     Status: None (Preliminary result)   Collection Time: 01/08/19  7:45 PM   Specimen: BLOOD RIGHT FOREARM  Result Value Ref Range Status   Specimen Description   Final    BLOOD RIGHT FOREARM Performed at [facility], [place] [address] [place], [place] [address]    Special Requests   Final    BOTTLES DRAWN AEROBIC AND ANAEROBIC Blood Culture adequate volume Performed at [facility] [address], [place], [place] [address]    Culture   Final    NO GROWTH 2 DAYS Performed at [facility] [address] [place], [place] [address]    Report Status PENDING  Incomplete  SARS Coronavirus 2 [facility]  order, Performed in [facility] hospital lab) Nasopharyngeal Nasopharyngeal Swab     Status: None   Collection Time: 01/08/19  7:46 PM   Specimen: Nasopharyngeal Swab  Result Value Ref Range Status   SARS Coronavirus 2 NEGATIVE NEGATIVE Final    Comment: (NOTE) If result is NEGATIVE SARS-CoV-2 target nucleic acids are NOT DETECTED. The SARS-CoV-2 RNA is generally detectable in upper and lower  respiratory specimens during the acute phase of infection. The lowest  concentration of SARS-CoV-2 viral copies this assay can detect is 250  copies / mL. A negative result does not preclude SARS-CoV-2 infection  and should not be used as the sole basis for treatment or other  patient management decisions.  A negative result may occur with  improper specimen collection / handling, submission of specimen other  than nasopharyngeal swab, presence of viral mutation(s) within the  areas targeted by this assay, and inadequate number of  viral copies  (<250 copies / mL). A negative result must be combined with clinical  observations, patient history, and epidemiological information. If result is POSITIVE SARS-CoV-2 target nucleic acids are DETECTED. The SARS-CoV-2 RNA is generally detectable in upper and lower  respiratory specimens dur ing the acute phase of infection.  Positive  results are indicative of active infection with SARS-CoV-2.  Clinical  correlation with patient history and other diagnostic information is  necessary to determine patient infection status.  Positive results do  not rule out bacterial infection or co-infection with other viruses. If result is PRESUMPTIVE POSTIVE SARS-CoV-2 nucleic acids MAY BE PRESENT.   A presumptive positive result was obtained on the submitted specimen  and confirmed on repeat testing.  While 2019 novel coronavirus  (SARS-CoV-2) nucleic acids may be present in the submitted sample  additional confirmatory testing may be necessary for epidemiological  and / or clinical management purposes  to differentiate between  SARS-CoV-2 and other Sarbecovirus currently known to infect humans.  If clinically indicated additional testing with an alternate test  methodology [phone]) is advised. The SARS-CoV-2 RNA is generally  detectable in upper and lower respiratory sp ecimens during the acute  phase of infection. The expected result is Negative. Fact Sheet for Patients:  StrictlyIdeas.no Fact Sheet for Healthcare Providers: BankingDealers.co.za This test is not yet approved or cleared by the [place] FDA and has been authorized for detection and/or diagnosis of SARS-CoV-2 by FDA under an Emergency Use Authorization (EUA).  This EUA will remain in effect (meaning this test can be used) for the duration of the COVID-19 declaration under Section 564(b)(1) of the Act, 21 U.S.C. section 360bbb-3(b)(1), unless the authorization is  terminated or revoked sooner. Performed at [facility] [place] [address], [place], [place] [address]   MRSA PCR Screening     Status: None   Collection Time: 01/09/19  1:31 AM   Specimen: Nasal Mucosa; Nasopharyngeal  Result Value Ref Range Status   MRSA by PCR NEGATIVE NEGATIVE Final    Comment:        The GeneXpert MRSA Assay (FDA approved for NASAL specimens only), is one component of a comprehensive MRSA colonization surveillance program. It is not intended to diagnose MRSA infection nor to guide or monitor treatment for MRSA infections. Performed at [facility], [place] [address] [place], [place] [address]   Blood culture (routine x 2)     Status: None (Preliminary result)   Collection Time: 01/09/19  1:52 AM   Specimen: BLOOD RIGHT HAND  Result Value  Ref Range Status   Specimen Description   Final    BLOOD RIGHT HAND Performed at [facility] [address] [place], [place] [address]    Special Requests   Final    BOTTLES DRAWN AEROBIC ONLY Blood Culture adequate volume Performed at [facility] [address], [place], [place] [address]    Culture   Final    NO GROWTH 2 DAYS Performed at [facility] [address], [place], [place] [address]    Report Status PENDING  Incomplete     Labs: BNP (last 3 results) No results for input(s): BNP in the last 8760 hours. Basic Metabolic Panel: Recent Labs  Lab 01/09/19 0152 01/09/19 0235 01/10/19 0231 01/11/19 0834 01/11/19 1418  NA 139 138 139 140 140  K 4.6 4.3 4.1 5.7* 5.3*  CL 103 102 105 104 104  CO2 27 28 26 26 28   GLUCOSE 71 71 103* 113* 85  BUN 31* 31* 24* 17 16  CREATININE 1.09 1.12 1.19 0.96 0.91  CALCIUM 9.5 9.3 9.2 9.7 9.7  MG  --  2.2  --   --   --   PHOS  --  3.6  --   --   --    Liver Function Tests: Recent Labs  Lab 01/08/19 1916 01/09/19 0235 01/10/19 0231 01/11/19 0834  AST 28 14* 14* 18  ALT 10 13  11 10   ALKPHOS 72 70 61 72  BILITOT 0.9 0.4 0.5 0.1*  PROT 7.6 7.2 6.7 7.7  ALBUMIN 3.2* 3.1* 3.0* 3.4*   Recent Labs  Lab 01/08/19 1916  LIPASE 20   No results for input(s): AMMONIA in the last 168 hours. CBC: Recent Labs  Lab 01/08/19 1916 01/09/19 0235 01/10/19 0231 01/11/19 0834  WBC 13.7* 11.4* 9.1 8.9  NEUTROABS 9.5*  --   --   --   HGB 11.7* 11.0* 10.5* 11.7*  HCT 38.7* 37.6* 35.0* 38.0*  MCV 97.7 99.7 99.4 95.2  PLT 390 401* 394 297   Cardiac Enzymes: No results for input(s): CKTOTAL, CKMB, CKMBINDEX, TROPONINI in the last 168 hours. BNP: Invalid input(s): POCBNP CBG: Recent Labs  Lab 01/10/19 1135 01/10/19 1557 01/10/19 2001 01/11/19 0126 01/11/19 1145  GLUCAP 110* 119* 101* 95 99   D-Dimer No results for input(s): DDIMER in the last 72 hours. Hgb A1c Recent Labs    01/09/19 0152  HGBA1C 5.2   Lipid Profile No results for input(s): CHOL, HDL, LDLCALC, TRIG, CHOLHDL, LDLDIRECT in the last 72 hours. Thyroid function studies Recent Labs    01/09/19 0235  TSH 3.942   Anemia work up No results for input(s): VITAMINB12, FOLATE, FERRITIN, TIBC, IRON, RETICCTPCT in the last 72 hours. Urinalysis    Component Value Date/Time   COLORURINE YELLOW 01/08/2019 1854   APPEARANCEUR HAZY (A) 01/08/2019 1854   LABSPEC 1.016 01/08/2019 1854   PHURINE 6.0 01/08/2019 1854   GLUCOSEU NEGATIVE 01/08/2019 1854   HGBUR NEGATIVE 01/08/2019 [place] 01/08/2019 [place] 01/08/2019 1854   PROTEINUR 100 (A) 01/08/2019 1854   NITRITE NEGATIVE 01/08/2019 1854   LEUKOCYTESUR LARGE (A) 01/08/2019 1854   Sepsis Labs Invalid input(s): PROCALCITONIN,  WBC,  LACTICIDVEN Microbiology Recent Results (from the past 240 hour(s))  Urine culture     Status: Abnormal   Collection Time: 01/08/19  6:54 PM   Specimen: Urine, Random  Result Value Ref Range Status   Specimen Description   Final  URINE, RANDOM Performed at [facility], [place] [address], [place], [place] [address]    Special Requests   Final    NONE Performed at [facility], [place] [address], [place], [place] [address]    Culture 40,000 COLONIES/mL YEAST (A)  Final   Report Status 01/11/2019 FINAL  Final  Blood culture (routine x 2)     Status: None (Preliminary result)   Collection Time: 01/08/19  7:45 PM   Specimen: BLOOD RIGHT FOREARM  Result Value Ref Range Status   Specimen Description   Final    BLOOD RIGHT FOREARM Performed at [facility], [place] [address] [place], [place] [address]    Special Requests   Final    BOTTLES DRAWN AEROBIC AND ANAEROBIC Blood Culture adequate volume Performed at [facility] [address], [place], [place] [address]    Culture   Final    NO GROWTH 2 DAYS Performed at [facility] [address] [place], [place] [address]    Report Status PENDING  Incomplete  SARS Coronavirus 2 [facility] order, Performed in [facility] hospital lab) Nasopharyngeal Nasopharyngeal Swab     Status: None   Collection Time: 01/08/19  7:46 PM   Specimen: Nasopharyngeal Swab  Result Value Ref Range Status   SARS Coronavirus 2 NEGATIVE NEGATIVE Final    Comment: (NOTE) If result is NEGATIVE SARS-CoV-2 target nucleic acids are NOT DETECTED. The SARS-CoV-2 RNA is generally detectable in upper and lower  respiratory specimens during the acute phase of infection. The lowest  concentration of SARS-CoV-2 viral copies this assay can detect is 250  copies / mL. A negative result does not preclude SARS-CoV-2 infection  and should not be used as the sole basis for treatment or other  patient management decisions.  A negative result may occur with  improper specimen collection / handling, submission of specimen other  than nasopharyngeal swab, presence of viral mutation(s) within the  areas targeted by this assay, and inadequate number of viral copies  (<250 copies /  mL). A negative result must be combined with clinical  observations, patient history, and epidemiological information. If result is POSITIVE SARS-CoV-2 target nucleic acids are DETECTED. The SARS-CoV-2 RNA is generally detectable in upper and lower  respiratory specimens dur ing the acute phase of infection.  Positive  results are indicative of active infection with SARS-CoV-2.  Clinical  correlation with patient history and other diagnostic information is  necessary to determine patient infection status.  Positive results do  not rule out bacterial infection or co-infection with other viruses. If result is PRESUMPTIVE POSTIVE SARS-CoV-2 nucleic acids MAY BE PRESENT.   A presumptive positive result was obtained on the submitted specimen  and confirmed on repeat testing.  While 2019 novel coronavirus  (SARS-CoV-2) nucleic acids may be present in the submitted sample  additional confirmatory testing may be necessary for epidemiological  and / or clinical management purposes  to differentiate between  SARS-CoV-2 and other Sarbecovirus currently known to infect humans.  If clinically indicated additional testing with an alternate test  methodology [phone]) is advised. The SARS-CoV-2 RNA is generally  detectable in upper and lower respiratory sp ecimens during the acute  phase of infection. The expected result is Negative. Fact Sheet for Patients:  StrictlyIdeas.no Fact Sheet for Healthcare Providers: BankingDealers.co.za This test is not yet approved or cleared by the [place] FDA and has been authorized for detection and/or diagnosis of SARS-CoV-2  by FDA under an Emergency Use Authorization (EUA).  This EUA will remain in effect (meaning this test can be used) for the duration of the COVID-19 declaration under Section 564(b)(1) of the Act, 21 U.S.C. section 360bbb-3(b)(1), unless the authorization is terminated or revoked  sooner. Performed at [facility], [place] [address], [place], [place] [address]   MRSA PCR Screening     Status: None   Collection Time: 01/09/19  1:31 AM   Specimen: Nasal Mucosa; Nasopharyngeal  Result Value Ref Range Status   MRSA by PCR NEGATIVE NEGATIVE Final    Comment:        The GeneXpert MRSA Assay (FDA approved for NASAL specimens only), is one component of a comprehensive MRSA colonization surveillance program. It is not intended to diagnose MRSA infection nor to guide or monitor treatment for MRSA infections. Performed at [facility], [place] [address] [place], [place] [address]   Blood culture (routine x 2)     Status: None (Preliminary result)   Collection Time: 01/09/19  1:52 AM   Specimen: BLOOD RIGHT HAND  Result Value Ref Range Status   Specimen Description   Final    BLOOD RIGHT HAND Performed at [facility] [address] [place], [place] [address]    Special Requests   Final    BOTTLES DRAWN AEROBIC ONLY Blood Culture adequate volume Performed at [facility] [address], [place], [place] [address]    Culture   Final    NO GROWTH 2 DAYS Performed at [facility] [address], [place], [place] [address]    Report Status PENDING  Incomplete   Time spent: 30 min  SIGNED:   Marylu Lund, MD  [facility] 01/11/2019, 4:13 PM  If 7PM-7AM, please contact night-coverage

## 2019-01-13 ENCOUNTER — Encounter (HOSPITAL_COMMUNITY): Payer: Self-pay | Admitting: Family Medicine

## 2019-01-13 ENCOUNTER — Inpatient Hospital Stay (HOSPITAL_COMMUNITY)
Admission: EM | Admit: 2019-01-13 | Discharge: 2019-01-15 | DRG: 871 | Disposition: A | Discharge status: Home Health | Payer: Medicare HMO | Attending: Internal Medicine | Admitting: Internal Medicine

## 2019-01-13 ENCOUNTER — Emergency Department (HOSPITAL_COMMUNITY): Payer: Medicare HMO

## 2019-01-13 DIAGNOSIS — D72829 Elevated white blood cell count, unspecified: Secondary | ICD-10-CM

## 2019-01-13 DIAGNOSIS — K920 Hematemesis: Secondary | ICD-10-CM | POA: Diagnosis not present

## 2019-01-13 DIAGNOSIS — E785 Hyperlipidemia, unspecified: Secondary | ICD-10-CM | POA: Diagnosis present

## 2019-01-13 DIAGNOSIS — T83511D Infection and inflammatory reaction due to indwelling urethral catheter, subsequent encounter: Secondary | ICD-10-CM

## 2019-01-13 DIAGNOSIS — Z8673 Personal history of transient ischemic attack (TIA), and cerebral infarction without residual deficits: Secondary | ICD-10-CM

## 2019-01-13 DIAGNOSIS — J690 Pneumonitis due to inhalation of food and vomit: Secondary | ICD-10-CM | POA: Diagnosis present

## 2019-01-13 DIAGNOSIS — Z823 Family history of stroke: Secondary | ICD-10-CM

## 2019-01-13 DIAGNOSIS — A419 Sepsis, unspecified organism: Secondary | ICD-10-CM | POA: Diagnosis present

## 2019-01-13 DIAGNOSIS — R112 Nausea with vomiting, unspecified: Secondary | ICD-10-CM

## 2019-01-13 DIAGNOSIS — Z7902 Long term (current) use of antithrombotics/antiplatelets: Secondary | ICD-10-CM

## 2019-01-13 DIAGNOSIS — N189 Chronic kidney disease, unspecified: Secondary | ICD-10-CM | POA: Diagnosis present

## 2019-01-13 DIAGNOSIS — Z9889 Other specified postprocedural states: Secondary | ICD-10-CM

## 2019-01-13 DIAGNOSIS — R197 Diarrhea, unspecified: Secondary | ICD-10-CM

## 2019-01-13 DIAGNOSIS — Z931 Gastrostomy status: Secondary | ICD-10-CM

## 2019-01-13 DIAGNOSIS — Z833 Family history of diabetes mellitus: Secondary | ICD-10-CM | POA: Diagnosis not present

## 2019-01-13 DIAGNOSIS — D631 Anemia in chronic kidney disease: Secondary | ICD-10-CM | POA: Diagnosis present

## 2019-01-13 DIAGNOSIS — Z20828 Contact with and (suspected) exposure to other viral communicable diseases: Secondary | ICD-10-CM | POA: Diagnosis present

## 2019-01-13 DIAGNOSIS — D509 Iron deficiency anemia, unspecified: Secondary | ICD-10-CM | POA: Diagnosis present

## 2019-01-13 DIAGNOSIS — Z87891 Personal history of nicotine dependence: Secondary | ICD-10-CM

## 2019-01-13 DIAGNOSIS — I129 Hypertensive chronic kidney disease with stage 1 through stage 4 chronic kidney disease, or unspecified chronic kidney disease: Secondary | ICD-10-CM | POA: Diagnosis present

## 2019-01-13 DIAGNOSIS — I10 Essential (primary) hypertension: Secondary | ICD-10-CM | POA: Diagnosis present

## 2019-01-13 DIAGNOSIS — N183 Chronic kidney disease, stage 3 (moderate): Secondary | ICD-10-CM | POA: Diagnosis present

## 2019-01-13 DIAGNOSIS — N182 Chronic kidney disease, stage 2 (mild): Secondary | ICD-10-CM | POA: Diagnosis present

## 2019-01-13 DIAGNOSIS — N390 Urinary tract infection, site not specified: Secondary | ICD-10-CM | POA: Diagnosis present

## 2019-01-13 DIAGNOSIS — R0689 Other abnormalities of breathing: Secondary | ICD-10-CM | POA: Diagnosis not present

## 2019-01-13 DIAGNOSIS — R1111 Vomiting without nausea: Secondary | ICD-10-CM

## 2019-01-13 DIAGNOSIS — K219 Gastro-esophageal reflux disease without esophagitis: Secondary | ICD-10-CM | POA: Diagnosis present

## 2019-01-13 DIAGNOSIS — Z930 Tracheostomy status: Secondary | ICD-10-CM | POA: Diagnosis not present

## 2019-01-13 LAB — CBC WITH DIFFERENTIAL/PLATELET
Abs Immature Granulocytes: 0.05 10*3/uL (ref 0.00–0.07)
Basophils Absolute: 0.1 10*3/uL (ref 0.0–0.1)
Basophils Relative: 0 %
Eosinophils Absolute: 0.3 10*3/uL (ref 0.0–0.5)
Eosinophils Relative: 2 %
HCT: 42.8 % (ref 39.0–52.0)
Hemoglobin: 12.8 g/dL — ABNORMAL LOW (ref 13.0–17.0)
Immature Granulocytes: 0 %
Lymphocytes Relative: 8 %
Lymphs Abs: 1.3 10*3/uL (ref 0.7–4.0)
MCH: 29.4 pg (ref 26.0–34.0)
MCHC: 29.9 g/dL — ABNORMAL LOW (ref 30.0–36.0)
MCV: 98.2 fL (ref 80.0–100.0)
Monocytes Absolute: 1.3 10*3/uL — ABNORMAL HIGH (ref 0.1–1.0)
Monocytes Relative: 8 %
Neutro Abs: 13.4 10*3/uL — ABNORMAL HIGH (ref 1.7–7.7)
Neutrophils Relative %: 82 %
Platelets: 539 10*3/uL — ABNORMAL HIGH (ref 150–400)
RBC: 4.36 MIL/uL (ref 4.22–5.81)
RDW: 13.9 % (ref 11.5–15.5)
WBC: 16.4 10*3/uL — ABNORMAL HIGH (ref 4.0–10.5)
nRBC: 0 % (ref 0.0–0.2)

## 2019-01-13 LAB — COMPREHENSIVE METABOLIC PANEL
ALT: 15 U/L (ref 0–44)
AST: 18 U/L (ref 15–41)
Albumin: 3.8 g/dL (ref 3.5–5.0)
Alkaline Phosphatase: 79 U/L (ref 38–126)
Anion gap: 13 (ref 5–15)
BUN: 22 mg/dL (ref 8–23)
CO2: 27 mmol/L (ref 22–32)
Calcium: 10.2 mg/dL (ref 8.9–10.3)
Chloride: 100 mmol/L (ref 98–111)
Creatinine, Ser: 1.02 mg/dL (ref 0.61–1.24)
GFR calc Af Amer: >60 mL/min (ref >60)
GFR calc non Af Amer: >60 mL/min (ref >60)
Glucose, Bld: 91 mg/dL (ref 70–99)
Potassium: 4.6 mmol/L (ref 3.5–5.1)
Sodium: 140 mmol/L (ref 135–145)
Total Bilirubin: 0.3 mg/dL (ref 0.3–1.2)
Total Protein: 8.7 g/dL — ABNORMAL HIGH (ref 6.5–8.1)

## 2019-01-13 LAB — URINALYSIS, ROUTINE W REFLEX MICROSCOPIC
Bilirubin Urine: NEGATIVE
Glucose, UA: NEGATIVE mg/dL
Hgb urine dipstick: NEGATIVE
Ketones, ur: NEGATIVE mg/dL
Nitrite: NEGATIVE
Protein, ur: >=300 mg/dL — AB
Specific Gravity, Urine: 1.016 (ref 1.005–1.030)
WBC, UA: >50 WBC/hpf — ABNORMAL HIGH (ref 0–5)
pH: 8 (ref 5.0–8.0)

## 2019-01-13 LAB — PROTIME-INR
INR: 1 (ref 0.8–1.2)
Prothrombin Time: 13.5 seconds (ref 11.4–15.2)

## 2019-01-13 LAB — APTT: aPTT: 42 seconds — ABNORMAL HIGH (ref 24–36)

## 2019-01-13 MED ORDER — SODIUM CHLORIDE 0.9 % IV BOLUS
1000.0000 mL | Freq: Once | INTRAVENOUS | Status: AC
  Administered 2019-01-13: 1000 mL via INTRAVENOUS

## 2019-01-13 MED ORDER — PIPERACILLIN-TAZOBACTAM 3.375 G IVPB
3.3750 g | Freq: Three times a day (TID) | INTRAVENOUS | Status: DC
  Administered 2019-01-14 – 2019-01-15 (×3): 3.375 g via INTRAVENOUS
  Filled 2019-01-13 (×3): qty 50

## 2019-01-13 MED ORDER — SODIUM CHLORIDE 0.9 % IV SOLN
INTRAVENOUS | Status: DC
  Administered 2019-01-14 – 2019-01-15 (×2): via INTRAVENOUS

## 2019-01-13 MED ORDER — ALBUTEROL SULFATE HFA 108 (90 BASE) MCG/ACT IN AERS
2.0000 | INHALATION_SPRAY | RESPIRATORY_TRACT | Status: DC | PRN
  Filled 2019-01-13: qty 6.7

## 2019-01-13 MED ORDER — SODIUM CHLORIDE 0.9 % IV BOLUS
1000.0000 mL | Freq: Once | INTRAVENOUS | Status: AC
  Administered 2019-01-14: 1000 mL via INTRAVENOUS

## 2019-01-13 MED ORDER — SODIUM CHLORIDE 0.9 % IV SOLN
1.0000 g | Freq: Once | INTRAVENOUS | Status: AC
  Administered 2019-01-13: 1 g via INTRAVENOUS
  Filled 2019-01-13: qty 10

## 2019-01-13 NOTE — ED Triage Notes (Signed)
Patient is from home and transported via [facility]. Patient was admitted on 09/23-09/26 for recurrent aspiration PNA, questionable UTI, and coffee ground emesis. Patient sent home with Augmentin and Reglan prescriptions that were never filled since the prescriptions are still attached to the discharge instructions. EMS reports patients sister cares for patient but is concerned of his well-being. They would like a Education officer, museum consult due to patients sister stating to them that she can no longer care for him at home, but does not want to place him in a facility. EMS was called out for vomiting but no evidence of vomiting with EMS. Patient is alert, but non verbal, this is baseline though.

## 2019-01-13 NOTE — ED Notes (Signed)
Attempted to start IV and obtain blood. Unsuccessful.

## 2019-01-13 NOTE — Progress Notes (Addendum)
Per EPD, sister is primary caretaker and also states he main concern is pt's vomiting but pt has not presented with vomiting and does not do so in the ED on previous visits.  CSW met w/ pt who is non-verbal and pt nodded yes to CSW's request for permission to call his sister.  Pt also nodded in the affirmative to CSW's question if pt wanted to return home.  CSW spoke to pt's sister and pt's sister states pt has been "constantly throwing up black substances and has black diarrhea" which the pt's PCP says is not normal, per the pt's sister, and sister stated,"his doctor told me that is not normal and to take him to the emergency room".  CSW stated he would document this and although the EPD is aware the CSW would tell EDP verbally, as well.  Pt's sister stated, "Well, knew I should have taken him somewhere else because he has been there three times and they can't find out what is wrong with him, I guess I'll take him somewhere else".  CSW apologized that nothing was found and asked if there was anything else he could do and pt's sister stated, "No, thank you, though".   CSW will continue to follow for D/C needs.  Scott Rivera. Saesha Llerenas, LCSW, LCAS, CSI Transitions of Care Clinical Social Worker Care Coordination Department Ph: [phone]

## 2019-01-13 NOTE — Care Management (Signed)
[facility] ED CM reviewed patient's record patient was discharged 9/24 with [facility] services.  [facility] ED CSW will follow up with consult.

## 2019-01-13 NOTE — Progress Notes (Signed)
Pharmacy Antibiotic Note  Scott Rivera is a 68 y.o. male admitted on 01/13/2019 with pneumonia and possible UTI Pharmacy has been consulted for zosyn dosing.  Plan: Zosyn 3.375g IV q8h (4 hour infusion).  F/u scr/cultures     Temp (24hrs), Avg:97.9 F (36.6 C), Min:97.7 F (36.5 C), Max:98.1 F (36.7 C)  Recent Labs  Lab 01/08/19 1916  01/09/19 0235 01/10/19 0231 01/11/19 0834 01/11/19 1418 01/13/19 1630  WBC 13.7*  --  11.4* 9.1 8.9  --  16.4*  CREATININE 1.17   < > 1.12 1.19 0.96 0.91 1.02   < > = values in this interval not displayed.    Estimated Creatinine Clearance: 67.7 mL/min (by C-G formula based on SCr of 1.02 mg/dL).    No Known Allergies  Antimicrobials this admission: 9/28 rocephin  >> x1 Ed 9/29 zosyn >>   Dose adjustments this admission:   Microbiology results:  BCx:   UCx:   Sputum:    MRSA PCR:   Thank you for allowing pharmacy to be a part of this patient's care.  Scott Rivera, Scott Rivera 01/13/2019 10:49 PM

## 2019-01-13 NOTE — ED Notes (Signed)
Respiratory called to suction pt's trach.

## 2019-01-13 NOTE — Progress Notes (Addendum)
Consult request has been received. CSW attempting to follow up at present time.  CSW reviewed chart and sees pt's final PT note was on 9/24 prior to pt's D/C from [facility] on 9/26 and sees pt refused to participate in PT (attempt at sitting up intially) indicating to PT he does not do this at home, it bed-bound and PT states pt presented at total max care" (see note below):  Cancelled Treatment:     Reason Eval/Treat Not Completed: PT screened, no needs identified, will sign off.Per chart, pt is bedbound at home. Reviewed previous therapy notes, pt has been Max-Total Assist +2 since CVA in 10/2016. Briefly assessed pt in room, he did follow commands for partial assessment of strength/ROM and answered yes/no questions appropriately by nodding/shaking head.  Pt adamantly refuses offer to attempt sitting up at EOB and indicates he does not do this at home and, in fact, spends all his time in bed. Based on historyand current presentation, pt appears to be total care at baseline.   *End of Note from 9/24*  9/28 ED CSW: If the pt presents today as pt did above on 9/24, then pt will likely be deemed custodial rather than rehab-able and will be a better candidate for LTC using pt's Medicaid than for short-term (30 days or less) SNF Rehab.  Per notes however, pt's sister states today that she, "does not want pt placed into a facility".  CSW will continue to follow for D/C needs.  Scott Rivera. Evlyn Amason, LCSW, LCAS, CSI Transitions of Care Clinical Social Worker Care Coordination Department Ph: [phone]

## 2019-01-13 NOTE — H&P (Signed)
History and Physical    Scott Rivera [id number] DOB: 1950/10/27 DOA: 01/13/2019  Referring MD/NP/PA:   PCP: [facility] [place]   Patient coming from:  The patient is coming from home.  At baseline, pt is dependent for most of ADL.        Chief Complaint: Nausea, vomiting, diarrhea, hematemesis  HPI: Scott Rivera is a 68 y.o. male with medical history significant of hypertension, hyperlipidemia, diet-controlled diabetes, stroke, nonverbal status, PEG tube, tracheostomy, pneumothorax, CKD stage II, iron deficiency anemia, depression, who presents with nausea, vomiting, diarrhea, hematemesis.  Patient is nonverbal, and cannot provide any medical history.  I called her sister, who provided history.  Patient was recently hospitalized from 9/23-9/26 due to aspiration pneumonia which was due to Pseudomonas and Klebsiella and possible UTI.  Patient was treated with Zosyn and discharged on Augmentin. Per his sister, pt has been having nausea and vomiting, with coffee-ground black vomitus.  Patient also has 2-4 time of diarrhea each day with dark brown stool.  He did not complain abdominal pain per his sisters.  Patient does not seem to have chest pain, shortness breath, cough per her sister.  Did not complains of symptoms of UTI.  Patient has a Foley catheter placed today. Of note, pt had brown emesis in previous admission, GI was consulted, thought was likely due to known esophagitis. Pt continued on PPI as tolerated.   ED Course: pt was found to have positive urinalysis (cloudy appearance, large amount of leukocyte, rare bacteria, WBC> 50), Hgb 12.8 <--11.7 on 9/26, pending COVID-19 test, pending FOBT, WBC 16.4, electrolytes renal function okay, temperature normal, blood pressure 157/104, heart rate 10 8, oxygen saturation 89 to 95% on room air.  Chest x-ray is negative for infiltration.  Patient is admitted to telemetry bed as inpatient.  Review of Systems: Could not reviewed due to  nonverbal status.  Allergy: No Known Allergies  Past Medical History:  Diagnosis Date  . Acute pulmonary edema (HCC)   . Anemia due to chronic kidney disease   . ARF (acute renal failure) ([place]) 10/19/2016  . Aspiration pneumonia ([place])   . Cerebellar infarct ([place]) 10/19/2016  . Chronic kidney disease, stage 3 (moderate) (HCC)   . CKD (chronic kidney disease) stage 2, GFR 60-89 ml/min 01/02/2017  . 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 Gastrostomy tube obstruction ([place])   . HCAP (healthcare-associated pneumonia)   . Helicobacter pylori infection 09/21/2012  . History of adenomatous polyp of colon 07/28/2015  . Hypertension   . MDR Acinetobacter baumannii infection   . Pneumothorax, closed, traumatic, initial encounter 12/10/2016  . Renal insufficiency   . Rhabdomyolysis 10/19/2016  . S/P percutaneous endoscopic gastrostomy (PEG) tube placement ([place]) 03/30/2017  . Stage III pressure ulcer of sacral region ([place]) 12/17/2016  . Stroke ([place])   . UTI (urinary tract infection) 03/30/2017    Past Surgical History:  Procedure Laterality Date  . BIOPSY  01/01/2019   Procedure: BIOPSY;  Surgeon: Irving Copas., MD;  Location: [facility];  Service: Gastroenterology;;  . ESOPHAGOGASTRODUODENOSCOPY (EGD) WITH PROPOFOL N/A 01/01/2019   Procedure: ESOPHAGOGASTRODUODENOSCOPY (EGD) WITH PROPOFOL;  Surgeon: Irving Copas., MD;  Location: [facility];  Service: Gastroenterology;  Laterality: N/A;  . IR GASTROSTOMY TUBE MOD SED  10/27/2016  . IR PATIENT EVAL TECH 0-60 MINS  01/05/2017  . IR PATIENT EVAL TECH 0-60 MINS  01/09/2017  . IR PATIENT EVAL TECH 0-60 MINS  03/03/2017  . IR PATIENT EVAL  TECH 0-60 MINS  04/03/2017  . IR REPLACE G-TUBE SIMPLE WO FLUORO  11/20/2017  . IR [place] TUBE PERCUT W/FLUORO  07/23/2017  . KNEE SURGERY    . TRACHEOSTOMY TUBE PLACEMENT      Social History:  reports that he has quit smoking. He has never used smokeless  tobacco. He reports previous alcohol use. He reports previous drug use.  Family History:  Family History  Problem Relation Age of Onset  . Diabetes Mellitus II Brother   . CAD Neg Hx   . Stroke Neg Hx      Prior to Admission medications   Medication Sig Start Date End Date Taking? Authorizing Provider  acetaminophen (TYLENOL) 500 MG tablet Place 1,000 mg into feeding tube 3 (three) times daily.     [provider]  amLODipine (NORVASC) 10 MG tablet Place 1 tablet (10 mg total) into feeding tube daily. 11/23/17   Kayleen Memos, DO  amoxicillin-clavulanate (AUGMENTIN) 400-57 MG/5ML suspension Place 10 mLs (800 mg total) into feeding tube every 12 (twelve) hours for 3 days. 01/12/19 01/15/19  Donne Hazel, MD  cloNIDine (CATAPRES - DOSED IN MG/24 HR) 0.1 mg/24hr patch Place 1 patch (0.1 mg total) onto the skin every Tuesday. 04/16/18   Aline August, MD  clopidogrel (PLAVIX) 75 MG tablet Place 1 tablet (75 mg total) into feeding tube daily. 01/04/19   Mariel Aloe, MD  Ferrous Sulfate (IRON) 325 (65 Fe) MG TABS Place 325 mg into feeding tube daily.     [provider]  gabapentin (NEURONTIN) 100 MG capsule Take 100 mg by mouth 3 (three) times daily. Open 1 capsule (100 mg) and administer contents via feeding tube three times daily    [provider]  glycopyrrolate (ROBINUL) 1 MG tablet Place 1 mg into feeding tube 2 (two) times daily.  11/18/18   [provider]  metoCLOPramide (REGLAN) 10 MG/10ML SOLN Place 5 mLs (5 mg total) into feeding tube every 8 (eight) hours. 01/11/19 02/10/19  Donne Hazel, MD  metoprolol tartrate (LOPRESSOR) 50 MG tablet Take 1 tablet (50 mg total) by mouth 2 (two) times daily. 12/21/17   Thurnell Lose, MD  mirtazapine (REMERON) 15 MG tablet Take 7.5 mg by mouth at bedtime. 12/24/18   [provider]  montelukast (SINGULAIR) 10 MG tablet Place 10 mg into feeding tube daily.     [provider]  Nutritional  Supplements (FEEDING SUPPLEMENT, JEVITY 1.2 CAL,) LIQD Initiate at 35 ml/hr and increase by 10 ml every 8 hours to reach goal rate of 65 ml/hr. 01/02/19   Mariel Aloe, MD  ondansetron (ZOFRAN) 4 MG tablet Place 1 tablet (4 mg total) into feeding tube every 6 (six) hours as needed for nausea. 11/24/18   Lucrezia Starch, MD  pantoprazole sodium (PROTONIX) 40 mg/20 mL PACK Place 20 mLs (40 mg total) into feeding tube 2 (two) times daily. 01/02/19   Mariel Aloe, MD  pravastatin (PRAVACHOL) 20 MG tablet Place 1 tablet (20 mg total) into feeding tube daily at 6 PM. Patient taking differently: Place 20 mg daily into feeding tube.  01/10/17   Cherene Altes, MD  scopolamine (TRANSDERM-SCOP) 1 MG/3DAYS Place 1 mg onto the skin every 3 (three) days. 12/30/18   [provider]  vitamin C (ASCORBIC ACID) 500 MG tablet Take 500 mg by mouth daily.    [provider]  Water For Irrigation, Sterile (FREE WATER) SOLN Place 100 mLs into feeding  tube every 3 (three) hours. Patient not taking: Reported on 11/19/2018 04/16/18   Aline August, MD    Physical Exam: Vitals:   01/14/19 0045 01/14/19 0100 01/14/19 0336 01/14/19 0400  BP: (!) 140/97 (!) 161/94 100/84 (!) 107/95  Pulse: (!) 120 (!) 119 (!) 102 (!) 108  Resp: (!) 24 18 15 14   Temp:      TempSrc:      SpO2: 97% 94% 98% 97%   General: Not in acute distress HEENT: s/p of trachostomy       Eyes: PERRL, EOMI, no scleral icterus.       ENT: No discharge from the ears and nose, no pharynx injection, no tonsillar enlargement.        Neck: No JVD, no bruit, no mass felt. Heme: No neck lymph node enlargement. Cardiac: S1/S2, RRR, No murmurs, No gallops or rubs. Respiratory: No rales, wheezing, rhonchi or rubs. GI: Soft, nondistended, nontender, no rebound pain, no organomegaly, BS present. S/p of PEG tube. GU: No hematuria Ext: No pitting leg edema bilaterally. 2+DP/PT pulse bilaterally. Musculoskeletal: has bilateral knee joint  deformity Skin: No rashes.  Neuro: alert, nonverbal, cranial nerves II-XII grossly intact, moves all extremities. Psych: Patient is not psychotic, no suicidal or hemocidal ideation.  Labs on Admission: I have personally reviewed following labs and imaging studies  CBC: Recent Labs  Lab 01/08/19 1916 01/09/19 0235 01/10/19 0231 01/11/19 0834 01/13/19 1630  WBC 13.7* 11.4* 9.1 8.9 16.4*  NEUTROABS 9.5*  --   --   --  13.4*  HGB 11.7* 11.0* 10.5* 11.7* 12.8*  HCT 38.7* 37.6* 35.0* 38.0* 42.8  MCV 97.7 99.7 99.4 95.2 98.2  PLT 390 401* 394 297 [id number]*   Basic Metabolic Panel: Recent Labs  Lab 01/09/19 0235 01/10/19 0231 01/11/19 0834 01/11/19 1418 01/13/19 1630  NA 138 139 140 140 140  K 4.3 4.1 5.7* 5.3* 4.6  CL 102 105 104 104 100  CO2 28 26 26 28 27   GLUCOSE 71 103* 113* 85 91  BUN 31* 24* 17 16 22   CREATININE 1.12 1.19 0.96 0.91 1.02  CALCIUM 9.3 9.2 9.7 9.7 10.2  MG 2.2  --   --   --   --   PHOS 3.6  --   --   --   --    GFR: Estimated Creatinine Clearance: 67.7 mL/min (by C-G formula based on SCr of 1.02 mg/dL). Liver Function Tests: Recent Labs  Lab 01/08/19 1916 01/09/19 0235 01/10/19 0231 01/11/19 0834 01/13/19 1630  AST 28 14* 14* 18 18  ALT 10 13 11 10 15   ALKPHOS 72 70 61 72 79  BILITOT 0.9 0.4 0.5 0.1* 0.3  PROT 7.6 7.2 6.7 7.7 8.7*  ALBUMIN 3.2* 3.1* 3.0* 3.4* 3.8   Recent Labs  Lab 01/08/19 1916  LIPASE 20   No results for input(s): AMMONIA in the last 168 hours. Coagulation Profile: Recent Labs  Lab 01/13/19 2240  INR 1.0   Cardiac Enzymes: No results for input(s): CKTOTAL, CKMB, CKMBINDEX, TROPONINI in the last 168 hours. BNP (last 3 results) No results for input(s): PROBNP in the last 8760 hours. HbA1C: No results for input(s): HGBA1C in the last 72 hours. CBG: Recent Labs  Lab 01/10/19 2001 01/11/19 0126 01/11/19 0803 01/11/19 1145 01/11/19 1634  GLUCAP 101* 95 94 99 98   Lipid Profile: No results for input(s): CHOL,  HDL, LDLCALC, TRIG, CHOLHDL, LDLDIRECT in the last 72 hours. Thyroid Function Tests: No results for input(s): TSH,  T4TOTAL, FREET4, T3FREE, THYROIDAB in the last 72 hours. Anemia Panel: No results for input(s): VITAMINB12, FOLATE, FERRITIN, TIBC, IRON, RETICCTPCT in the last 72 hours. Urine analysis:    Component Value Date/Time   COLORURINE YELLOW 01/13/2019 2039   APPEARANCEUR CLOUDY (A) 01/13/2019 2039   LABSPEC 1.016 01/13/2019 2039   PHURINE 8.0 01/13/2019 2039   GLUCOSEU NEGATIVE 01/13/2019 2039   HGBUR NEGATIVE 01/13/2019 2039   BILIRUBINUR NEGATIVE 01/13/2019 2039   KETONESUR NEGATIVE 01/13/2019 2039   PROTEINUR >=300 (A) 01/13/2019 2039   NITRITE NEGATIVE 01/13/2019 2039   LEUKOCYTESUR LARGE (A) 01/13/2019 2039   Sepsis Labs: @LABRCNTIP (procalcitonin:4,lacticidven:4) ) Recent Results (from the past 240 hour(s))  Urine culture     Status: Abnormal   Collection Time: 01/08/19  6:54 PM   Specimen: Urine, Random  Result Value Ref Range Status   Specimen Description   Final    URINE, RANDOM Performed at [facility], [place] [address], [place], [place] [address]    Special Requests   Final    NONE Performed at [facility], [place] [address] [place], [place] [address]    Culture 40,000 COLONIES/mL YEAST (A)  Final   Report Status 01/11/2019 FINAL  Final  Blood culture (routine x 2)     Status: None (Preliminary result)   Collection Time: 01/08/19  7:45 PM   Specimen: BLOOD RIGHT FOREARM  Result Value Ref Range Status   Specimen Description   Final    BLOOD RIGHT FOREARM Performed at [facility], [place] [address], [place], [place] [address]    Special Requests   Final    BOTTLES DRAWN AEROBIC AND ANAEROBIC Blood Culture adequate volume Performed at [place] [address], [place], [place] [address]    Culture   Final    NO GROWTH 4 DAYS Performed at [facility], [place] [address],  [place],  [address]    Report Status PENDING  Incomplete  SARS Coronavirus 2 [facility] order, Performed in [facility] hospital lab) Nasopharyngeal Nasopharyngeal Swab     Status: None   Collection Time: 01/08/19  7:46 PM   Specimen: Nasopharyngeal Swab  Result Value Ref Range Status   SARS Coronavirus 2 NEGATIVE NEGATIVE Final    Comment: (NOTE) If result is NEGATIVE SARS-CoV-2 target nucleic acids are NOT DETECTED. The SARS-CoV-2 RNA is generally detectable in upper and lower  respiratory specimens during the acute phase of infection. The lowest  concentration of SARS-CoV-2 viral copies this assay can detect is 250  copies / mL. A negative result does not preclude SARS-CoV-2 infection  and should not be used as the sole basis for treatment or other  patient management decisions.  A negative result may occur with  improper specimen collection / handling, submission of specimen other  than nasopharyngeal swab, presence of viral mutation(s) within the  areas targeted by this assay, and inadequate number of viral copies  (<250 copies / mL). A negative result must be combined with clinical  observations, patient history, and epidemiological information. If result is POSITIVE SARS-CoV-2 target nucleic acids are DETECTED. The SARS-CoV-2 RNA is generally detectable in upper and lower  respiratory specimens dur ing the acute phase of infection.  Positive  results are indicative of active infection with SARS-CoV-2.  Clinical  correlation with patient history and other diagnostic information is  necessary to determine patient infection status.  Positive results do  not rule out bacterial infection or co-infection with  other viruses. If result is PRESUMPTIVE POSTIVE SARS-CoV-2 nucleic acids MAY BE PRESENT.   A presumptive positive result was obtained on the submitted specimen  and confirmed on repeat testing.  While 2019 novel coronavirus  (SARS-CoV-2) nucleic acids may be present in the  submitted sample  additional confirmatory testing may be necessary for epidemiological  and / or clinical management purposes  to differentiate between  SARS-CoV-2 and other Sarbecovirus currently known to infect humans.  If clinically indicated additional testing with an alternate test  methodology [phone]) is advised. The SARS-CoV-2 RNA is generally  detectable in upper and lower respiratory sp ecimens during the acute  phase of infection. The expected result is Negative. Fact Sheet for Patients:  StrictlyIdeas.no Fact Sheet for Healthcare Providers: BankingDealers.co.za This test is not yet approved or cleared by the [place] FDA and has been authorized for detection and/or diagnosis of SARS-CoV-2 by FDA under an Emergency Use Authorization (EUA).  This EUA will remain in effect (meaning this test can be used) for the duration of the COVID-19 declaration under Section 564(b)(1) of the Act, 21 U.S.C. section 360bbb-3(b)(1), unless the authorization is terminated or revoked sooner. Performed at [facility], [place] [address], [place], [place] [address]   MRSA PCR Screening     Status: None   Collection Time: 01/09/19  1:31 AM   Specimen: Nasal Mucosa; Nasopharyngeal  Result Value Ref Range Status   MRSA by PCR NEGATIVE NEGATIVE Final    Comment:        The GeneXpert MRSA Assay (FDA approved for NASAL specimens only), is one component of a comprehensive MRSA colonization surveillance program. It is not intended to diagnose MRSA infection nor to guide or monitor treatment for MRSA infections. Performed at [facility], [place] [address] [place], [place] [address]   Blood culture (routine x 2)     Status: None (Preliminary result)   Collection Time: 01/09/19  1:52 AM   Specimen: BLOOD RIGHT HAND  Result Value Ref Range Status   Specimen Description   Final    BLOOD RIGHT HAND Performed at  [place] [address], [place], [place] [address]    Special Requests   Final    BOTTLES DRAWN AEROBIC ONLY Blood Culture adequate volume Performed at [facility] [address] [place], [place] [address]    Culture   Final    NO GROWTH 4 DAYS Performed at [facility], [place] [address], [place],  [address]    Report Status PENDING  Incomplete     Radiological Exams on Admission: Dg Chest Portable 1 View  Result Date: 01/13/2019 CLINICAL DATA:  Aspiration.  Shortness of breath. EXAM: PORTABLE CHEST 1 VIEW COMPARISON:  January 08, 2019 FINDINGS: A tracheostomy tube is stable. The cardiomediastinal silhouette is stable. No pneumothorax. No nodules or masses. Vascular crowding in the medial right lung base is stable. No focal infiltrate. IMPRESSION: No active disease. Electronically Signed   By: Dorise Bullion III M.D   On: 01/13/2019 16:58     EKG:   Not done in ED, will get one.   Assessment/Plan Principal Problem:   Hematemesis Active Problems:   Essential hypertension   History of cerebral infarction   CKD (chronic kidney disease) stage 2, GFR 60-89 ml/min   Sepsis ([place])   Anemia due to chronic kidney disease   History of tracheostomy   UTI (urinary tract infection)   Tracheostomy tube present ([place])  Chronic respiratory insufficiency   Aspiration pneumonia (HCC)   Nausea vomiting and diarrhea   Hematemesis: Etiology is not clear.  Hemoglobin stable, in recent admission, pt had brown emesis in previous admission, GI was consulted, thought was likely due to known esophagitis. Pt is on PPI.  - will admit to tele bed as inpt -hold plavix - NPO - IVF: 1L NS bolus, then at 100 mL/hr - Start IV pantoprazole gtt - Zofran IV for nausea - Avoid NSAIDs and SQ heparin - Maintain IV access (2 large bore IVs if possible). - Monitor closely and follow q6h cbc, transfuse as necessary, if Hgb<7.0 - LaB: INR, PTT and type  screen -FOBT - please call GI in AM  Essential hypertension: Patient is not taking Klonopin patch -Amlodipine, metoprolol -IV hydralazine as needed  History of cerebral infarction: -hold plavix due ot GIB -Continue pravastatin  CKD (chronic kidney disease) stage 2, GFR 60-89 ml/min: stable. Cre 1.02, BUN 22 -f/u by BMP  Recent aspiration pneumonia and chronic respiratory insufficiency, and UTI, and sepsis: Patient is supposed to take Augmentin until 9/30.  Patient still has leukocytosis, but no fever, meet criteria for sepsis with leukocytosis, tachycardia and tachypnea. -Switch to Zosyn -Follow-up blood culture and urine culture -will get Procalcitonin and trend lactic acid levels per sepsis protocol. -IVF: 1L of NS bolus in ED, followed by 100 cc/h   Anemia due to chronic kidney disease: hgb stable -continue iron supplement  History of tracheostomy: -consult RT  Nausea vomiting and diarrhea: Etiology is not clear. -Check C. difficile PCR and GI pathogen panel  Diet-controlled diabetes: A1c 5.2 recently.  Blood sugar 91. -Check CBG every 4 hour   Inpatient status:  # Patient requires inpatient status due to high intensity of service, high risk for further deterioration and high frequency of surveillance required.  I certify that at the point of admission it is my clinical judgment that the patient will require inpatient hospital care spanning beyond 2 midnights from the point of admission.  . This patient has multiple chronic comorbidities including hypertension, hyperlipidemia, diet-controlled diabetes, stroke, nonverbal status, PEG tube, tracheostomy, pneumothorax, CKD stage II, iron deficiency anemia, depression . Now patient has presenting with  nausea, vomiting, diarrhea, hematemesis . The worrisome physical exam findings include coffee-ground hematemesis  . the initial radiographic and laboratory data are worrisome because of positive urinalysis, leukocytosis .  Current medical needs: please see my assessment and plan . Predictability of an adverse outcome (risk): Patient has multiple comorbidities, now presents with hematemesis, nausea vomiting, diarrhea.  Patient also has a sepsis due to UTI and recent aspiration pneumonia.  His presentation is highly complicated.  He is at high risk for deteriorating.  Will need to be treated in hospital for at least 2 days.      DVT ppx: SCD Code Status: Full code Family Communication:   Yes, patient's sister by phone Disposition Plan: to be determined Consults called:  none Admission status:   Inpatient/tele     Date of Service 01/14/2019    [facility]   If 7PM-7AM, please contact night-coverage [email or web address] Password [email or web address] 01/14/2019, 4:25 AM

## 2019-01-14 ENCOUNTER — Other Ambulatory Visit: Payer: Self-pay

## 2019-01-14 DIAGNOSIS — K920 Hematemesis: Secondary | ICD-10-CM | POA: Diagnosis present

## 2019-01-14 LAB — CBC
HCT: 39.5 % (ref 39.0–52.0)
HCT: 40.3 % (ref 39.0–52.0)
Hemoglobin: 12 g/dL — ABNORMAL LOW (ref 13.0–17.0)
Hemoglobin: 11.9 g/dL — ABNORMAL LOW (ref 13.0–17.0)
MCH: 29.3 pg (ref 26.0–34.0)
MCH: 29.9 pg (ref 26.0–34.0)
MCHC: 30.4 g/dL (ref 30.0–36.0)
MCHC: 29.5 g/dL — ABNORMAL LOW (ref 30.0–36.0)
MCV: 96.6 fL (ref 80.0–100.0)
MCV: 101.3 fL — ABNORMAL HIGH (ref 80.0–100.0)
Platelets: 390 10*3/uL (ref 150–400)
Platelets: 406 10*3/uL — ABNORMAL HIGH (ref 150–400)
RBC: 4.09 MIL/uL — ABNORMAL LOW (ref 4.22–5.81)
RBC: 3.98 MIL/uL — ABNORMAL LOW (ref 4.22–5.81)
RDW: 14 % (ref 11.5–15.5)
RDW: 14.1 % (ref 11.5–15.5)
WBC: 13.5 10*3/uL — ABNORMAL HIGH (ref 4.0–10.5)
WBC: 12.2 10*3/uL — ABNORMAL HIGH (ref 4.0–10.5)
nRBC: 0 % (ref 0.0–0.2)
nRBC: 0 % (ref 0.0–0.2)

## 2019-01-14 LAB — CBG MONITORING, ED: Glucose-Capillary: 99 mg/dL (ref 70–99)

## 2019-01-14 LAB — BASIC METABOLIC PANEL
Anion gap: 10 (ref 5–15)
BUN: 23 mg/dL (ref 8–23)
CO2: 26 mmol/L (ref 22–32)
Calcium: 9.2 mg/dL (ref 8.9–10.3)
Chloride: 105 mmol/L (ref 98–111)
Creatinine, Ser: 1.05 mg/dL (ref 0.61–1.24)
GFR calc Af Amer: >60 mL/min (ref >60)
GFR calc non Af Amer: >60 mL/min (ref >60)
Glucose, Bld: 91 mg/dL (ref 70–99)
Potassium: 4.1 mmol/L (ref 3.5–5.1)
Sodium: 141 mmol/L (ref 135–145)

## 2019-01-14 LAB — SARS CORONAVIRUS 2 (TAT 6-24 HRS): SARS Coronavirus 2: NEGATIVE

## 2019-01-14 LAB — CULTURE, BLOOD (ROUTINE X 2)
Culture: NO GROWTH
Culture: NO GROWTH
Special Requests: ADEQUATE
Special Requests: ADEQUATE

## 2019-01-14 LAB — LACTIC ACID, PLASMA: Lactic Acid, Venous: 2.6 mmol/L (ref 0.5–1.9)

## 2019-01-14 LAB — GLUCOSE, CAPILLARY
Glucose-Capillary: 72 mg/dL (ref 70–99)
Glucose-Capillary: 81 mg/dL (ref 70–99)
Glucose-Capillary: 85 mg/dL (ref 70–99)

## 2019-01-14 MED ORDER — PANTOPRAZOLE SODIUM 40 MG IV SOLR: 40.0000 mg | Freq: Two times a day (BID) | INTRAVENOUS | Status: DC

## 2019-01-14 MED ORDER — SODIUM CHLORIDE 0.9 % IV SOLN
8.0000 mg/h | INTRAVENOUS | Status: DC
  Administered 2019-01-14: 8 mg/h via INTRAVENOUS
  Filled 2019-01-14 (×2): qty 80

## 2019-01-14 MED ORDER — VITAMIN C 500 MG PO TABS
500.0000 mg | ORAL_TABLET | Freq: Every day | ORAL | Status: DC
  Administered 2019-01-14 – 2019-01-15 (×2): 500 mg via ORAL
  Filled 2019-01-14 (×2): qty 1

## 2019-01-14 MED ORDER — CHLORHEXIDINE GLUCONATE CLOTH 2 % EX PADS
6.0000 | MEDICATED_PAD | Freq: Every day | CUTANEOUS | Status: DC
  Administered 2019-01-14: 6 via TOPICAL

## 2019-01-14 MED ORDER — DEXTROSE 50 % IV SOLN: 50.0000 mL | INTRAVENOUS | Status: DC | PRN

## 2019-01-14 MED ORDER — FREE WATER
100.0000 mL | Status: DC
  Administered 2019-01-14 – 2019-01-15 (×10): 100 mL

## 2019-01-14 MED ORDER — JEVITY 1.2 CAL PO LIQD
1000.0000 mL | ORAL | Status: DC
  Administered 2019-01-14: 1000 mL
  Filled 2019-01-14 (×2): qty 1000

## 2019-01-14 MED ORDER — ACETAMINOPHEN 650 MG RE SUPP: 650.0000 mg | Freq: Four times a day (QID) | RECTAL | Status: DC | PRN

## 2019-01-14 MED ORDER — MIRTAZAPINE 15 MG PO TABS
7.5000 mg | ORAL_TABLET | Freq: Every day | ORAL | Status: DC
  Administered 2019-01-15: 7.5 mg via ORAL
  Filled 2019-01-14: qty 1

## 2019-01-14 MED ORDER — METOPROLOL TARTRATE 25 MG PO TABS: 50.0000 mg | ORAL_TABLET | Freq: Two times a day (BID) | ORAL | Status: DC

## 2019-01-14 MED ORDER — ACETAMINOPHEN 325 MG PO TABS: 650.0000 mg | ORAL_TABLET | Freq: Four times a day (QID) | ORAL | Status: DC | PRN

## 2019-01-14 MED ORDER — GLYCOPYRROLATE 1 MG PO TABS
1.0000 mg | ORAL_TABLET | Freq: Two times a day (BID) | ORAL | Status: DC
  Administered 2019-01-14 – 2019-01-15 (×3): 1 mg
  Filled 2019-01-14 (×4): qty 1

## 2019-01-14 MED ORDER — GABAPENTIN 100 MG PO CAPS
100.0000 mg | ORAL_CAPSULE | Freq: Three times a day (TID) | ORAL | Status: DC
  Administered 2019-01-14 – 2019-01-15 (×4): 100 mg via ORAL
  Filled 2019-01-14 (×4): qty 1

## 2019-01-14 MED ORDER — FERROUS SULFATE 300 (60 FE) MG/5ML PO SYRP
325.0000 mg | ORAL_SOLUTION | Freq: Every day | ORAL | Status: DC
  Administered 2019-01-14 – 2019-01-15 (×2): 325 mg
  Filled 2019-01-14 (×2): qty 10

## 2019-01-14 MED ORDER — PRAVASTATIN SODIUM 20 MG PO TABS
20.0000 mg | ORAL_TABLET | Freq: Every day | ORAL | Status: DC
  Administered 2019-01-15: 20 mg
  Filled 2019-01-14: qty 1

## 2019-01-14 MED ORDER — MONTELUKAST SODIUM 10 MG PO TABS
10.0000 mg | ORAL_TABLET | Freq: Every day | ORAL | Status: DC
  Administered 2019-01-14 – 2019-01-15 (×2): 10 mg
  Filled 2019-01-14 (×2): qty 1

## 2019-01-14 MED ORDER — SODIUM CHLORIDE 0.9 % IV SOLN
80.0000 mg | Freq: Once | INTRAVENOUS | Status: AC
  Administered 2019-01-14: 80 mg via INTRAVENOUS
  Filled 2019-01-14: qty 80

## 2019-01-14 MED ORDER — PANTOPRAZOLE SODIUM 40 MG PO PACK
40.0000 mg | PACK | Freq: Every day | ORAL | Status: DC
  Administered 2019-01-15 (×2): 40 mg
  Filled 2019-01-14 (×2): qty 20

## 2019-01-14 MED ORDER — FAMOTIDINE IN NACL 20-0.9 MG/50ML-% IV SOLN: 20.0000 mg | Freq: Two times a day (BID) | INTRAVENOUS | Status: DC

## 2019-01-14 MED ORDER — HYDRALAZINE HCL 20 MG/ML IJ SOLN: 5.0000 mg | INTRAMUSCULAR | Status: DC | PRN

## 2019-01-14 MED ORDER — ONDANSETRON HCL 4 MG/2ML IJ SOLN: 4.0000 mg | Freq: Three times a day (TID) | INTRAMUSCULAR | Status: DC | PRN

## 2019-01-14 MED ORDER — AMLODIPINE BESYLATE 5 MG PO TABS: 10.0000 mg | ORAL_TABLET | Freq: Every day | ORAL | Status: DC

## 2019-01-14 NOTE — ED Notes (Signed)
Attempted to stick patient for morning labs but was not successful. Will contact phlebotomy again to obtain labs. Will continue to monitor patient.

## 2019-01-14 NOTE — ED Notes (Signed)
Mylo Red left her phone # [phone]

## 2019-01-14 NOTE — ED Provider Notes (Signed)
[facility] DEPT Provider Note   CSN: [id number] Arrival date & time: 01/13/19  1556     History   Chief Complaint Chief Complaint  Patient presents with  . Emesis    HPI Scott Rivera is a 68 y.o. male.  Presents emergency department with report of coffee-ground emesis.  History primarily provided by sister.  States since patient was discharged home he has been having many episodes of coffee ground emesis, states vomit is brown, no frank blood.  No respiratory distress, chest pain, abdominal pain.  No fever.  Level 5 caveat - history limited due to patient's baseline mental status, nonverbal status, trach dependence.     HPI  Past Medical History:  Diagnosis Date  . Acute pulmonary edema (HCC)   . Anemia due to chronic kidney disease   . ARF (acute renal failure) ([place]) 10/19/2016  . Aspiration pneumonia ([place])   . Cerebellar infarct ([place]) 10/19/2016  . Chronic kidney disease, stage 3 (moderate) (HCC)   . CKD (chronic kidney disease) stage 2, GFR 60-89 ml/min 01/02/2017  . 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 Gastrostomy tube obstruction ([place])   . HCAP (healthcare-associated pneumonia)   . Helicobacter pylori infection 09/21/2012  . History of adenomatous polyp of colon 07/28/2015  . Hypertension   . MDR Acinetobacter baumannii infection   . Pneumothorax, closed, traumatic, initial encounter 12/10/2016  . Renal insufficiency   . Rhabdomyolysis 10/19/2016  . S/P percutaneous endoscopic gastrostomy (PEG) tube placement ([place]) 03/30/2017  . Stage III pressure ulcer of sacral region ([place]) 12/17/2016  . Stroke ([place])   . UTI (urinary tract infection) 03/30/2017    Patient Active Problem List   Diagnosis Date Noted  . Hematemesis 01/14/2019  . Nausea vomiting and diarrhea 01/13/2019  . HCAP (healthcare-associated pneumonia) 01/08/2019  . Hyperkalemia 01/08/2019  . Aspiration pneumonia ([place]) 01/01/2019  . GI bleed  12/31/2018  . Pneumoperitoneum 04/09/2018  . Perforated viscus 04/09/2018  . 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 Aspiration of gastric contents   . Malnutrition of moderate degree 11/19/2017  . Aspiration pneumonia of right lower lobe ([place]) 11/16/2017  . Nausea and vomiting 11/16/2017  . Tracheostomy tube present ([place]) 09/25/2017  . Chronic respiratory insufficiency 07/24/2017  . Hematuria 06/26/2017  . Foley catheter in place on admission 03/30/2017  . UTI (urinary tract infection) 03/30/2017  . Diabetes mellitus ([place]) 03/14/2017  . History of tracheostomy 03/14/2017  . Percutaneous endoscopic gastrostomy status ([place]) 03/14/2017  . Gastroesophageal reflux disease without esophagitis 03/14/2017  . Acute respiratory failure with hypoxia ([place]) 03/02/2017  . Loculated pleural effusion   . CKD (chronic kidney disease) stage 2, GFR 60-89 ml/min 01/02/2017  . Fever   . Sepsis ([place]) 12/09/2016  . Anemia due to chronic kidney disease 12/09/2016  . Compensated metabolic alkalosis [id number]  . Goals of care, counseling/discussion   . Palliative care by specialist   . Aspiration into airway   . Dysphagia, post-stroke   . Tachypnea   . Leukocytosis   . Acute blood loss anemia   . Pressure injury of skin 10/20/2016  . Essential hypertension 10/19/2016  . History of cerebral infarction 10/19/2016  . Bilateral chronic knee pain 01/07/2016  . Tobacco abuse 12/23/2014  . Mixed hyperlipidemia 07/28/2013    Past Surgical History:  Procedure Laterality Date  . BIOPSY  01/01/2019   Procedure: BIOPSY;  Surgeon: Rush Landmark Telford Nab., MD;  Location: [facility] ENDOSCOPY;  Service: Gastroenterology;;  . ESOPHAGOGASTRODUODENOSCOPY (EGD) WITH PROPOFOL N/A 01/01/2019   Procedure: ESOPHAGOGASTRODUODENOSCOPY (EGD) WITH PROPOFOL;  Surgeon: Rush Landmark Telford Nab., MD;  Location: [facility] ENDOSCOPY;  Service: Gastroenterology;  Laterality: N/A;  . IR GASTROSTOMY TUBE MOD  SED  10/27/2016  . IR PATIENT EVAL TECH 0-60 MINS  01/05/2017  . IR PATIENT EVAL TECH 0-60 MINS  01/09/2017  . IR PATIENT EVAL TECH 0-60 MINS  03/03/2017  . IR PATIENT EVAL TECH 0-60 MINS  04/03/2017  . IR REPLACE G-TUBE SIMPLE WO FLUORO  11/20/2017  . IR [place] TUBE PERCUT W/FLUORO  07/23/2017  . KNEE SURGERY    . TRACHEOSTOMY TUBE PLACEMENT          Home Medications    Prior to Admission medications   Medication Sig Start Date End Date Taking? Authorizing Provider  acetaminophen (TYLENOL) 500 MG tablet Place 1,000 mg into feeding tube 3 (three) times daily.    Yes [provider]  amLODipine (NORVASC) 10 MG tablet Place 1 tablet (10 mg total) into feeding tube daily. 11/23/17  Yes Irene Pap N, DO  amoxicillin-clavulanate (AUGMENTIN) 400-57 MG/5ML suspension Place 10 mLs (800 mg total) into feeding tube every 12 (twelve) hours for 3 days. 01/12/19 01/15/19 Yes Donne Hazel, MD  cloNIDine (CATAPRES - DOSED IN MG/24 HR) 0.1 mg/24hr patch Place 1 patch (0.1 mg total) onto the skin every Tuesday. 04/16/18  Yes Aline August, MD  clopidogrel (PLAVIX) 75 MG tablet Place 1 tablet (75 mg total) into feeding tube daily. 01/04/19  Yes Mariel Aloe, MD  Ferrous Sulfate (IRON) 325 (65 Fe) MG TABS Place 325 mg into feeding tube daily.    Yes [provider]  gabapentin (NEURONTIN) 100 MG capsule Take 100 mg by mouth 3 (three) times daily. Open 1 capsule (100 mg) and administer contents via feeding tube three times daily   Yes [provider]  glycopyrrolate (ROBINUL) 1 MG tablet Place 1 mg into feeding tube 2 (two) times daily.  11/18/18  Yes [provider]  metoCLOPramide (REGLAN) 10 MG/10ML SOLN Place 5 mLs (5 mg total) into feeding tube every 8 (eight) hours. 01/11/19 02/10/19 Yes Donne Hazel, MD  metoprolol tartrate (LOPRESSOR) 50 MG tablet Take 1 tablet (50 mg total) by mouth 2 (two) times daily. 12/21/17  Yes Thurnell Lose, MD  mirtazapine  (REMERON) 15 MG tablet Take 7.5 mg by mouth at bedtime. 12/24/18  Yes [provider]  montelukast (SINGULAIR) 10 MG tablet Place 10 mg into feeding tube daily.    Yes [provider]  Nutritional Supplements (FEEDING SUPPLEMENT, JEVITY 1.2 CAL,) LIQD Initiate at 35 ml/hr and increase by 10 ml every 8 hours to reach goal rate of 65 ml/hr. 01/02/19  Yes Mariel Aloe, MD  ondansetron (ZOFRAN) 4 MG tablet Place 1 tablet (4 mg total) into feeding tube every 6 (six) hours as needed for nausea. 11/24/18  Yes Lucrezia Starch, MD  pantoprazole sodium (PROTONIX) 40 mg/20 mL PACK Place 20 mLs (40 mg total) into feeding tube 2 (two) times daily. 01/02/19  Yes Mariel Aloe, MD  pravastatin (PRAVACHOL) 20 MG tablet Place 1 tablet (20 mg total) into feeding tube daily at 6 PM. Patient taking differently: Place 20 mg daily into feeding tube.  01/10/17  Yes Cherene Altes, MD  scopolamine (TRANSDERM-SCOP) 1 MG/3DAYS Place 1 mg onto the skin every 3 (three) days. 12/30/18  Yes [provider]  vitamin C (ASCORBIC ACID) 500 MG  tablet Take 500 mg by mouth daily.   Yes [provider]  Water For Irrigation, Sterile (FREE WATER) SOLN Place 100 mLs into feeding tube every 3 (three) hours. Patient not taking: Reported on 11/19/2018 04/16/18   Aline August, MD    Family History Family History  Problem Relation Age of Onset  . Diabetes Mellitus II Brother   . CAD Neg Hx   . Stroke Neg Hx     Social History Social History   Tobacco Use  . Smoking status: Former Research scientist (life sciences)  . Smokeless tobacco: Never Used  . Tobacco comment: Smoked heavily until the day of his stroke  Substance Use Topics  . Alcohol use: Not Currently  . Drug use: Not Currently     Allergies   Patient has no known allergies.   Review of Systems Review of Systems  Unable to perform ROS: Patient nonverbal     Physical Exam Updated Vital Signs BP 110/65 (BP Location: Right Arm)   Pulse 78   Temp  98 F (36.7 C) (Oral)   Resp 18   SpO2 98%   Physical Exam Vitals signs and nursing note reviewed.  Constitutional:      Appearance: He is well-developed.     Comments: Trach, G-tube dependent lying in bed in no acute distress  HENT:     Head: Normocephalic and atraumatic.     Mouth/Throat:     Mouth: Mucous membranes are moist.     Pharynx: Oropharynx is clear.  Eyes:     Conjunctiva/sclera: Conjunctivae normal.  Neck:     Musculoskeletal: Normal range of motion and neck supple.     Comments: Trach intact, trach collar intact Cardiovascular:     Rate and Rhythm: Normal rate and regular rhythm.     Heart sounds: No murmur.  Pulmonary:     Effort: Pulmonary effort is normal. No respiratory distress.     Breath sounds: Normal breath sounds.  Abdominal:     Palpations: Abdomen is soft.     Tenderness: There is no abdominal tenderness.  Musculoskeletal:        General: No swelling or tenderness.  Skin:    General: Skin is warm and dry.     Capillary Refill: Capillary refill takes less than 2 seconds.  Neurological:     General: No focal deficit present.     Mental Status: He is alert and oriented to person, place, and time.  Psychiatric:        Mood and Affect: Mood normal.        Behavior: Behavior normal.      ED Treatments / Results  Labs (all labs ordered are listed, but only abnormal results are displayed) Labs Reviewed  CBC WITH DIFFERENTIAL/PLATELET - Abnormal; Notable for the following components:      Result Value   WBC 16.4 (*)    Hemoglobin 12.8 (*)    MCHC 29.9 (*)    Platelets 539 (*)    Neutro Abs 13.4 (*)    Monocytes Absolute 1.3 (*)    All other components within normal limits  COMPREHENSIVE METABOLIC PANEL - Abnormal; Notable for the following components:   Total Protein 8.7 (*)    All other components within normal limits  URINALYSIS, ROUTINE W REFLEX MICROSCOPIC - Abnormal; Notable for the following components:   APPearance CLOUDY (*)     Protein, ur >=300 (*)    Leukocytes,Ua LARGE (*)    WBC, UA >50 (*)    Bacteria,  UA RARE (*)    All other components within normal limits  APTT - Abnormal; Notable for the following components:   aPTT 42 (*)    All other components within normal limits  CBC - Abnormal; Notable for the following components:   WBC 13.5 (*)    RBC 4.09 (*)    Hemoglobin 12.0 (*)    All other components within normal limits  SARS CORONAVIRUS 2 (TAT 6-24 HRS)  URINE CULTURE  CULTURE, BLOOD (ROUTINE X 2)  CULTURE, BLOOD (ROUTINE X 2)  EXPECTORATED SPUTUM ASSESSMENT W REFEX TO RESP CULTURE  C DIFFICILE QUICK SCREEN W PCR REFLEX  PROTIME-INR  BASIC METABOLIC PANEL  LACTIC ACID, PLASMA  LACTIC ACID, PLASMA  PROCALCITONIN  GI PATHOGEN PANEL BY PCR, STOOL  CBC  CBC  CBC  OCCULT BLOOD X 1 CARD TO LAB, STOOL  CBC  CBG MONITORING, ED  CBG MONITORING, ED  CBG MONITORING, ED  CBG MONITORING, ED  TYPE AND SCREEN    EKG None  Radiology Dg Chest Portable 1 View  Result Date: 01/13/2019 CLINICAL DATA:  Aspiration.  Shortness of breath. EXAM: PORTABLE CHEST 1 VIEW COMPARISON:  January 08, 2019 FINDINGS: A tracheostomy tube is stable. The cardiomediastinal silhouette is stable. No pneumothorax. No nodules or masses. Vascular crowding in the medial right lung base is stable. No focal infiltrate. IMPRESSION: No active disease. Electronically Signed   By: Dorise Bullion III M.D   On: 01/13/2019 16:58    Procedures Procedures (including critical care time)  Medications Ordered in ED Medications  0.9 %  sodium chloride infusion ( Intravenous New Bag/Given 01/14/19 0553)  albuterol (VENTOLIN HFA) 108 (90 Base) MCG/ACT inhaler 2 puff (has no administration in time range)  dextrose 50 % solution 50 mL (has no administration in time range)  ondansetron (ZOFRAN) injection 4 mg (has no administration in time range)  acetaminophen (TYLENOL) tablet 650 mg (has no administration in time range)    Or   acetaminophen (TYLENOL) suppository 650 mg (has no administration in time range)  hydrALAZINE (APRESOLINE) injection 5 mg (has no administration in time range)  piperacillin-tazobactam (ZOSYN) IVPB 3.375 g (0 g Intravenous Stopped 01/14/19 0655)  pantoprazole (PROTONIX) 80 mg in sodium chloride 0.9 % 250 mL (0.32 mg/mL) infusion (8 mg/hr Intravenous New Bag/Given 01/14/19 0555)  pantoprazole (PROTONIX) injection 40 mg (has no administration in time range)  free water 100 mL (100 mLs Per Tube Given 01/14/19 1343)  pravastatin (PRAVACHOL) tablet 20 mg (has no administration in time range)  mirtazapine (REMERON) tablet 7.5 mg (has no administration in time range)  glycopyrrolate (ROBINUL) tablet 1 mg (1 mg Per Tube Given 01/14/19 1343)  ferrous sulfate 300 (60 Fe) MG/5ML syrup 325 mg (325 mg Per Tube Given 01/14/19 1343)  gabapentin (NEURONTIN) capsule 100 mg (100 mg Oral Given 01/14/19 1436)  vitamin C (ASCORBIC ACID) tablet 500 mg (500 mg Oral Given 01/14/19 1342)  montelukast (SINGULAIR) tablet 10 mg (10 mg Per Tube Given 01/14/19 1343)  cefTRIAXone (ROCEPHIN) 1 g in sodium chloride 0.9 % 100 mL IVPB (0 g Intravenous Stopped 01/14/19 0019)  sodium chloride 0.9 % bolus 1,000 mL (0 mLs Intravenous Stopped 01/14/19 0019)  sodium chloride 0.9 % bolus 1,000 mL (0 mLs Intravenous Stopped 01/14/19 0329)  pantoprazole (PROTONIX) 80 mg in sodium chloride 0.9 % 100 mL IVPB (0 mg Intravenous Stopped 01/14/19 0619)     Initial Impression / Assessment and Plan / ED Course  I have reviewed the triage vital  signs and the nursing notes.  Pertinent labs & imaging results that were available during my care of the patient were reviewed by me and considered in my medical decision making (see chart for details).  Clinical Course as of Jan 14 1599  Mon Jan 13, 2019  1650 Complete initial assessment, patient well-appearing with no acute complaints   [RD]  1711 Discussed with patient's sister who is primary caretaker, her  primary concern is reported episodes of coffee-ground emesis, states has been same for last few weeks, concerned no one is addressing her concern   [RD]    Clinical Course User Index [RD] Lucrezia Starch, MD       68 year old male who is trach, G-tube dependent, cared for by sister presents the ER with reported coffee-ground emesis.  Patient here is well-appearing stable blood pressure afebrile but noted tachycardia.  I had seen patient on prior ER visits and he appears to be at his baseline overall appearance today.  GI evaluated patient when there was concern for prior coffee-ground emesis, prior endoscopy demonstrated esophagitis and they recommended PPI.  On last admission they did not recommend doing any further intervention.  There is been no change in this reported coffee-ground emesis.  He had no additional coffee-ground emesis while in the department.  Hemoglobin at baseline.  Concerned that patient's white count significantly increased and his urine looks to be quite infected.  Believe patient benefit from admission, started antibiotics and consulted hospitalist for admission.  Final Clinical Impressions(s) / ED Diagnoses   Final diagnoses:  Vomiting without nausea, intractability of vomiting not specified, unspecified vomiting type  Urinary tract infection without hematuria, site unspecified  Leukocytosis, unspecified type    ED Discharge Orders    None       Lucrezia Starch, MD 01/14/19 [phone]

## 2019-01-14 NOTE — Progress Notes (Signed)
PHARMACY NOTE -  zosyn  Pharmacy has been assisting with dosing of zosyn for PNA/UTI/sepsis. Dosage remains stable at 3.375 gm IV q8h (infuse over 4 hrs) and need for further dosage adjustment appears unlikely at present.    Will sign off at this time.  Please reconsult if a change in clinical status warrants re-evaluation of dosage.   Dia Sitter, PharmD, BCPS 01/14/2019 10:30 AM

## 2019-01-14 NOTE — ED Notes (Signed)
Phlebotomy at bedside.

## 2019-01-14 NOTE — ED Notes (Addendum)
RT called for suctioning and lab called regarding blood draws.

## 2019-01-14 NOTE — ED Notes (Signed)
Attempted to call phlebotomy again to collect labs but phone was ringing busy. Will attempt to call again.

## 2019-01-14 NOTE — ED Notes (Signed)
ED TO INPATIENT HANDOFF REPORT  ED Nurse Name and Phone #:   Zaydrian Batta RN   S Name/Age/Gender Scott Rivera 68 y.o. male Room/Bed: WA01/WA01  Code Status   Code Status: Full Code  Home/SNF/Other Rehab Patient oriented to: self Is this baseline? Yes   Triage Complete: Triage complete  Chief Complaint Vomiting  Triage Note Patient is from home and transported via [facility]. Patient was admitted on 09/23-09/26 for recurrent aspiration PNA, questionable UTI, and coffee ground emesis. Patient sent home with Augmentin and Reglan prescriptions that were never filled since the prescriptions are still attached to the discharge instructions. EMS reports patients sister cares for patient but is concerned of his well-being. They would like a Education officer, museum consult due to patients sister stating to them that she can no longer care for him at home, but does not want to place him in a facility. EMS was called out for vomiting but no evidence of vomiting with EMS. Patient is alert, but non verbal, this is baseline though.    Allergies No Known Allergies  Level of Care/Admitting Diagnosis ED Disposition    ED Disposition Condition Comment   Admit  Hospital Area: [place] [[id number]]  Level of Care: Telemetry [5]  Admit to tele based on following criteria: Other see comments  Comments: sepsis and tachy  Covid Evaluation: Asymptomatic Screening Protocol (No Symptoms)  Diagnosis: Nausea vomiting and diarrhea [id number]  Admitting Physician: Ivor Costa [[id number]]  Attending Physician: Ivor Costa [phone]  Estimated length of stay: past midnight tomorrow  Certification:: I certify this patient will need inpatient services for at least 2 midnights  PT Class (Do Not Modify): Inpatient [101]  PT Acc Code (Do Not Modify): Private [1]       B Medical/Surgery History Past Medical History:  Diagnosis Date  . Acute pulmonary edema (HCC)   . Anemia due to chronic kidney  disease   . ARF (acute renal failure) ([place]) 10/19/2016  . Aspiration pneumonia ([place])   . Cerebellar infarct ([place]) 10/19/2016  . Chronic kidney disease, stage 3 (moderate) (HCC)   . CKD (chronic kidney disease) stage 2, GFR 60-89 ml/min 01/02/2017  . 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 Gastrostomy tube obstruction ([place])   . HCAP (healthcare-associated pneumonia)   . Helicobacter pylori infection 09/21/2012  . History of adenomatous polyp of colon 07/28/2015  . Hypertension   . MDR Acinetobacter baumannii infection   . Pneumothorax, closed, traumatic, initial encounter 12/10/2016  . Renal insufficiency   . Rhabdomyolysis 10/19/2016  . S/P percutaneous endoscopic gastrostomy (PEG) tube placement ([place]) 03/30/2017  . Stage III pressure ulcer of sacral region ([place]) 12/17/2016  . Stroke ([place])   . UTI (urinary tract infection) 03/30/2017   Past Surgical History:  Procedure Laterality Date  . BIOPSY  01/01/2019   Procedure: BIOPSY;  Surgeon: Irving Copas., MD;  Location: [facility];  Service: Gastroenterology;;  . ESOPHAGOGASTRODUODENOSCOPY (EGD) WITH PROPOFOL N/A 01/01/2019   Procedure: ESOPHAGOGASTRODUODENOSCOPY (EGD) WITH PROPOFOL;  Surgeon: Irving Copas., MD;  Location: [facility];  Service: Gastroenterology;  Laterality: N/A;  . IR GASTROSTOMY TUBE MOD SED  10/27/2016  . IR PATIENT EVAL TECH 0-60 MINS  01/05/2017  . IR PATIENT EVAL TECH 0-60 MINS  01/09/2017  . IR PATIENT EVAL TECH 0-60 MINS  03/03/2017  . IR PATIENT EVAL TECH 0-60 MINS  04/03/2017  . IR REPLACE G-TUBE SIMPLE WO FLUORO  11/20/2017  . IR REPLC GASTRO/COLONIC TUBE  PERCUT W/FLUORO  07/23/2017  . KNEE SURGERY    . TRACHEOSTOMY TUBE PLACEMENT       A IV Location/Drains/Wounds Patient Lines/Drains/Airways Status   Active Line/Drains/Airways    Name:   Placement date:   Placement time:   Site:   Days:   Peripheral IV 01/13/19 Left Antecubital   01/13/19    1823    Antecubital   1    Gastrostomy/Enterostomy Percutaneous endoscopic gastrostomy (PEG) LLQ   11/14/16    -    LLQ   791   Urethral Catheter Farrell Ours RN 16 Fr.   12/31/18    1837    -   14   Tracheostomy [place] 6 mm Uncuffed   01/13/19    1828    6 mm   1   Wound / Incision (Open or Dehisced) small blister pustules on back arms and abdomen   -    -    -             Intake/Output Last 24 hours  Intake/Output Summary (Last 24 hours) at 01/14/2019 1500 Last data filed at 01/14/2019 0655 Gross per 24 hour  Intake 1150 ml  Output -  Net 1150 ml    Labs/Imaging Results for orders placed or performed during the hospital encounter of 01/13/19 (from the past 48 hour(s))  CBC with Differential     Status: Abnormal   Collection Time: 01/13/19  4:30 PM  Result Value Ref Range   WBC 16.4 (H) 4.0 - 10.5 K/uL   RBC 4.36 4.22 - 5.81 MIL/uL   Hemoglobin 12.8 (L) 13.0 - 17.0 g/dL   HCT 42.8 39.0 - 52.0 %   MCV 98.2 80.0 - 100.0 fL   MCH 29.4 26.0 - 34.0 pg   MCHC 29.9 (L) 30.0 - 36.0 g/dL   RDW 13.9 11.5 - 15.5 %   Platelets 539 (H) 150 - 400 K/uL   nRBC 0.0 0.0 - 0.2 %   Neutrophils Relative % 82 %   Neutro Abs 13.4 (H) 1.7 - 7.7 K/uL   Lymphocytes Relative 8 %   Lymphs Abs 1.3 0.7 - 4.0 K/uL   Monocytes Relative 8 %   Monocytes Absolute 1.3 (H) 0.1 - 1.0 K/uL   Eosinophils Relative 2 %   Eosinophils Absolute 0.3 0.0 - 0.5 K/uL   Basophils Relative 0 %   Basophils Absolute 0.1 0.0 - 0.1 K/uL   Immature Granulocytes 0 %   Abs Immature Granulocytes 0.05 0.00 - 0.07 K/uL    Comment: Performed at [facility], [place] [address], [place],  [address]  Comprehensive metabolic panel     Status: Abnormal   Collection Time: 01/13/19  4:30 PM  Result Value Ref Range   Sodium 140 135 - 145 mmol/L   Potassium 4.6 3.5 - 5.1 mmol/L   Chloride 100 98 - 111 mmol/L   CO2 27 22 - 32 mmol/L   Glucose, Bld 91 70 - 99 mg/dL   BUN 22 8 - 23 mg/dL   Creatinine, Ser 1.02 0.61 - 1.24 mg/dL    Calcium 10.2 8.9 - 10.3 mg/dL   Total Protein 8.7 (H) 6.5 - 8.1 g/dL   Albumin 3.8 3.5 - 5.0 g/dL   AST 18 15 - 41 U/L   ALT 15 0 - 44 U/L   Alkaline Phosphatase 79 38 - 126 U/L   Total Bilirubin 0.3 0.3 - 1.2 mg/dL   GFR calc non Af Amer >60 >  60 mL/min   GFR calc Af Amer >60 >60 mL/min   Anion gap 13 5 - 15    Comment: Performed at [facility], [place] [address] [place], [place] [address]  Urinalysis, Routine w reflex microscopic     Status: Abnormal   Collection Time: 01/13/19  8:39 PM  Result Value Ref Range   Color, Urine YELLOW YELLOW   APPearance CLOUDY (A) CLEAR   Specific Gravity, Urine 1.016 1.005 - 1.030   pH 8.0 5.0 - 8.0   Glucose, UA NEGATIVE NEGATIVE mg/dL   Hgb urine dipstick NEGATIVE NEGATIVE   Bilirubin Urine NEGATIVE NEGATIVE   Ketones, ur NEGATIVE NEGATIVE mg/dL   Protein, ur >=300 (A) NEGATIVE mg/dL   Nitrite NEGATIVE NEGATIVE   Leukocytes,Ua LARGE (A) NEGATIVE   RBC / HPF 11-20 0 - 5 RBC/hpf   WBC, UA >50 (H) 0 - 5 WBC/hpf   Bacteria, UA RARE (A) NONE SEEN   WBC Clumps PRESENT    Budding Yeast PRESENT    Hyphae Yeast PRESENT     Comment: Performed at [facility], [place] [address], [place], [place] [address]  SARS CORONAVIRUS 2 (TAT 6-24 HRS) Nasopharyngeal Nasopharyngeal Swab     Status: None   Collection Time: 01/13/19  9:41 PM   Specimen: Nasopharyngeal Swab  Result Value Ref Range   SARS Coronavirus 2 NEGATIVE NEGATIVE    Comment: (NOTE) SARS-CoV-2 target nucleic acids are NOT DETECTED. The SARS-CoV-2 RNA is generally detectable in upper and lower respiratory specimens during the acute phase of infection. Negative results do not preclude SARS-CoV-2 infection, do not rule out co-infections with other pathogens, and should not be used as the sole basis for treatment or other patient management decisions. Negative results must be combined with clinical observations, patient history, and epidemiological information.  The expected result is Negative. Fact Sheet for Patients: SugarRoll.be Fact Sheet for Healthcare Providers: https://www.woods-mathews.com/ This test is not yet approved or cleared by the [place] FDA and  has been authorized for detection and/or diagnosis of SARS-CoV-2 by FDA under an Emergency Use Authorization (EUA). This EUA will remain  in effect (meaning this test can be used) for the duration of the COVID-19 declaration under Section 56 4(b)(1) of the Act, 21 U.S.C. section 360bbb-3(b)(1), unless the authorization is terminated or revoked sooner. Performed at [facility], [place] [address] [place], [place] [address]   APTT     Status: Abnormal   Collection Time: 01/13/19 10:40 PM  Result Value Ref Range   aPTT 42 (H) 24 - 36 seconds    Comment:        IF BASELINE aPTT IS ELEVATED, SUGGEST PATIENT RISK ASSESSMENT BE USED TO DETERMINE APPROPRIATE ANTICOAGULANT THERAPY. Performed at [facility], [place] [address] [place], [place] [address]   Protime-INR     Status: None   Collection Time: 01/13/19 10:40 PM  Result Value Ref Range   Prothrombin Time 13.5 11.4 - 15.2 seconds   INR 1.0 0.8 - 1.2    Comment: (NOTE) INR goal varies based on device and disease states. Performed at [facility], [place] [address], [place], [place] [address]   CBG monitoring, ED     Status: None   Collection Time: 01/14/19  5:46 AM  Result Value Ref Range   Glucose-Capillary 99 70 - 99 mg/dL  CBC     Status: Abnormal   Collection Time: 01/14/19  7:20 AM  Result Value Ref  Range   WBC 13.5 (H) 4.0 - 10.5 K/uL   RBC 4.09 (L) 4.22 - 5.81 MIL/uL   Hemoglobin 12.0 (L) 13.0 - 17.0 g/dL   HCT 39.5 39.0 - 52.0 %   MCV 96.6 80.0 - 100.0 fL   MCH 29.3 26.0 - 34.0 pg   MCHC 30.4 30.0 - 36.0 g/dL   RDW 14.0 11.5 - 15.5 %   Platelets 390 150 - 400 K/uL   nRBC 0.0 0.0 - 0.2 %    Comment: Performed at [facility], [place] [address], [place], [place] [id number]  Basic metabolic panel     Status: None   Collection Time: 01/14/19  7:32 AM  Result Value Ref Range   Sodium 141 135 - 145 mmol/L   Potassium 4.1 3.5 - 5.1 mmol/L   Chloride 105 98 - 111 mmol/L   CO2 26 22 - 32 mmol/L   Glucose, Bld 91 70 - 99 mg/dL   BUN 23 8 - 23 mg/dL   Creatinine, Ser 1.05 0.61 - 1.24 mg/dL   Calcium 9.2 8.9 - 10.3 mg/dL   GFR calc non Af Amer >60 >60 mL/min   GFR calc Af Amer >60 >60 mL/min   Anion gap 10 5 - 15    Comment: Performed at [facility], [place] [address], [place], [place] [address]   Dg Chest Portable 1 View  Result Date: 01/13/2019 CLINICAL DATA:  Aspiration.  Shortness of breath. EXAM: PORTABLE CHEST 1 VIEW COMPARISON:  January 08, 2019 FINDINGS: A tracheostomy tube is stable. The cardiomediastinal silhouette is stable. No pneumothorax. No nodules or masses. Vascular crowding in the medial right lung base is stable. No focal infiltrate. IMPRESSION: No active disease. Electronically Signed   By: Dorise Bullion III M.D   On: 01/13/2019 16:58    Pending Labs Unresulted Labs (From admission, onward)    Start     Ordered   01/14/19 1301  Occult blood card to lab, stool  Once,   STAT     01/14/19 1300   01/14/19 0240  CBC  Now then every 6 hours,   R (with STAT occurrences)    Question:  Specimen collection method  Answer:  IV Team=IV Team collect   01/14/19 0239   01/14/19 0144  C difficile quick scan w PCR reflex  (C Difficile quick screen w PCR reflex panel)  Once, for 24 hours,   STAT     01/14/19 0143   01/14/19 0144  GI pathogen panel by PCR, stool  (Gastrointestinal Panel by PCR, Stool                                                                                                                                                     *Does Not include CLOSTRIDIUM DIFFICILE testing.**If CDIFF testing is  needed, select the C Difficile Quick Screen w PCR reflex order  below)  Once,   STAT     01/14/19 0143   01/13/19 2330  Procalcitonin  Once,   R     01/13/19 2330   01/13/19 2241  Culture, sputum-assessment  ONCE - STAT,   R     01/13/19 2240   01/13/19 2241  Type and screen [place]  Once,   STAT    Comments: [place]    01/13/19 2240   01/13/19 2240  Urine Culture  Add-on,   AD    Comments: Please get clean catch specimen ONLY (DO NOT obtain from urinal)    01/13/19 2239   01/13/19 2240  Culture, blood (x 2)  BLOOD CULTURE X 2,   STAT    Comments: INITIATE ANTIBIOTICS WITHIN 1 HOUR AFTER BLOOD CULTURES DRAWN.  If unable to obtain blood cultures, call MD immediately regarding antibiotic instructions.    01/13/19 2240   01/13/19 2240  Lactic acid, plasma  STAT Now then every 2 hours,   STAT    Question:  Specimen collection method  Answer:  IV Team=IV Team collect   01/13/19 2240   Unscheduled  Occult blood card to lab, stool  As needed,   R     01/14/19 0143          Vitals/Pain Today's Vitals   01/14/19 0747 01/14/19 1215 01/14/19 1430 01/14/19 1459  BP:    110/65  Pulse: 93 83 91 80  Resp: 16 16 17 17   Temp:    98 F (36.7 C)  TempSrc:    Oral  SpO2: 98% 98% 99% 99%  PainSc:        Isolation Precautions Enteric precautions (UV disinfection)  Medications Medications  0.9 %  sodium chloride infusion ( Intravenous New Bag/Given 01/14/19 0553)  albuterol (VENTOLIN HFA) 108 (90 Base) MCG/ACT inhaler 2 puff (has no administration in time range)  dextrose 50 % solution 50 mL (has no administration in time range)  ondansetron (ZOFRAN) injection 4 mg (has no administration in time range)  acetaminophen (TYLENOL) tablet 650 mg (has no administration in time range)    Or  acetaminophen (TYLENOL) suppository 650 mg (has no administration in time range)  hydrALAZINE (APRESOLINE) injection 5 mg (has no administration in time range)  piperacillin-tazobactam (ZOSYN) IVPB 3.375 g (0 g Intravenous  Stopped 01/14/19 0655)  pantoprazole (PROTONIX) 80 mg in sodium chloride 0.9 % 250 mL (0.32 mg/mL) infusion (8 mg/hr Intravenous New Bag/Given 01/14/19 0555)  pantoprazole (PROTONIX) injection 40 mg (has no administration in time range)  free water 100 mL (100 mLs Per Tube Given 01/14/19 1343)  pravastatin (PRAVACHOL) tablet 20 mg (has no administration in time range)  mirtazapine (REMERON) tablet 7.5 mg (has no administration in time range)  glycopyrrolate (ROBINUL) tablet 1 mg (1 mg Per Tube Given 01/14/19 1343)  ferrous sulfate 300 (60 Fe) MG/5ML syrup 325 mg (325 mg Per Tube Given 01/14/19 1343)  gabapentin (NEURONTIN) capsule 100 mg (100 mg Oral Given 01/14/19 1436)  vitamin C (ASCORBIC ACID) tablet 500 mg (500 mg Oral Given 01/14/19 1342)  montelukast (SINGULAIR) tablet 10 mg (10 mg Per Tube Given 01/14/19 1343)  cefTRIAXone (ROCEPHIN) 1 g in sodium chloride 0.9 % 100 mL IVPB (0 g Intravenous Stopped 01/14/19 0019)  sodium chloride 0.9 % bolus 1,000 mL (0 mLs Intravenous Stopped 01/14/19 0019)  sodium chloride 0.9 % bolus 1,000 mL (0 mLs Intravenous  Stopped 01/14/19 0329)  pantoprazole (PROTONIX) 80 mg in sodium chloride 0.9 % 100 mL IVPB (0 mg Intravenous Stopped 01/14/19 [id number])    Mobility non-ambulatory Moderate fall risk   Focused Assessments    R Recommendations: See Admitting Provider Note  Report given to:   Additional Notes:

## 2019-01-14 NOTE — ED Notes (Addendum)
Informed by off going RN that phlebotomy has been called to attempt to collect labs due to patient being a difficult stick. Attempted to call  Phlebotomy again but no answer. Will continue to monitor patient.

## 2019-01-14 NOTE — ED Notes (Signed)
Patient sleeping peacefully at this time with no complaints. Oxygen saturation WDL. Will continue to monitor patient.

## 2019-01-14 NOTE — Progress Notes (Signed)
VAST consulted to place second IV access d/t incompatible meds. Assessed pt's lower and upper left arm utilizing ultrasound; no appropriate vessels to access. Assessed pt's lower right arm and found one vein to attempt USGIV. As I stuck pt, unit nurse came in and stated to stop IV attempts, as physician had changed orders so that one IV access would be appropriate.

## 2019-01-14 NOTE — Progress Notes (Signed)
PROGRESS NOTE    Scott Rivera  [id number] DOB: December 01, 1950 DOA: 01/13/2019 PCP: [facility]    Brief Narrative: 68 y.o. male with medical history significant of hypertension, hyperlipidemia, diet-controlled diabetes, stroke, nonverbal status, PEG tube, tracheostomy, pneumothorax, CKD stage II, iron deficiency anemia, depression, who presents with nausea, vomiting, diarrhea, hematemesis.  Patient is nonverbal, and cannot provide any medical history.  I called her sister, who provided history.  Patient was recently hospitalized from 9/23-9/26 due to aspiration pneumonia which was due to Pseudomonas and Klebsiella and possible UTI.  Patient was treated with Zosyn and discharged on Augmentin. Per his sister, pt has been having nausea and vomiting, with coffee-ground black vomitus.  Patient also has 2-4 time of diarrhea each day with dark brown stool.  He did not complain abdominal pain per his sisters.  Patient does not seem to have chest pain, shortness breath, cough per her sister.  Did not complains of symptoms of UTI.  Patient has a Foley catheter placed today. Of note, pt had brown emesis in previous admission, GI was consulted, thought was likely due to known esophagitis. Pt continuedon PPI as tolerated.   ED Course: pt was found to have positive urinalysis (cloudy appearance, large amount of leukocyte, rare bacteria, WBC> 50), Hgb 12.8 <--11.7 on 9/26, pending COVID-19 test, pending FOBT, WBC 16.4, electrolytes renal function okay, temperature normal, blood pressure 157/104, heart rate 10 8, oxygen saturation 89 to 95% on room air.  Chest x-ray is negative for infiltration.  Patient is admitted to telemetry bed as inpatient.  Assessment & Plan:   Principal Problem:   Hematemesis Active Problems:   Essential hypertension   History of cerebral infarction   CKD (chronic kidney disease) stage 2, GFR 60-89 ml/min   Sepsis ([place])   Anemia due to chronic kidney disease   History  of tracheostomy   UTI (urinary tract infection)   Tracheostomy tube present (HCC)   Chronic respiratory insufficiency   Aspiration pneumonia (HCC)   Nausea vomiting and diarrhea    #1 hematemesis patient has had no further hematemesis since coming into the hospital.  His hemoglobin has been stable.  He has been on IV fluids.  I will continue IV fluids hold the Plavix continue Protonix PRN Zofran this was discussed with GI today.  Will consult GI if he drops hemoglobin or has further episodes of hematemesis. Serial h and h Check fobt  #2 hypertension soft hold meds  #3 history of stroke on Plavix which is on hold due to reported hematemesis continue statin  #4 CKD stage II stable  #5 recent aspiration pneumonia history of trach- cxr nad  #6 anemia chronic secondary to CKD hemoglobin stable  #7 history of tracheostomy  #8 reported nausea vomiting diarrhea by family follow-up  #9 he carries a diagnosis of diet-controlled diabetes which is stable    Estimated body mass index is 27.4 kg/m as calculated from the following:   Height as of 01/09/19: 5\' 6"  (1.676 m).   Weight as of 01/09/19: 77 kg.  DVT prophylaxis:scd  Code Status: full Family Communication:dw sister Disposition Plan:  Pending clinical improvement   Consultants:   Discussed with GI over the phone Procedures: None Antimicrobials: Zosyn  Subjective: Resting in bed he is nonverbal in no acute distress  Objective: Vitals:   01/14/19 0608 01/14/19 0630 01/14/19 0747 01/14/19 1215  BP: (!) 123/93 106/87    Pulse: 98 100 93 83  Resp: 16 14 16 16   Temp:  TempSrc:      SpO2: 97% 98% 98% 98%    Intake/Output Summary (Last 24 hours) at 01/14/2019 1230 Last data filed at 01/14/2019 0655 Gross per 24 hour  Intake 1150 ml  Output -  Net 1150 ml   There were no vitals filed for this visit.  Examination:  General exam: Appears calm and comfortable  Respiratory system: Clear to auscultation.  Respiratory effort normal.trach in place Cardiovascular system: S1 & S2 heard, RRR. No JVD, murmurs, rubs, gallops or clicks. No pedal edema. Gastrointestinal system: Abdomen is nondistended, soft and nontender. No organomegaly or masses felt. Normal bowel sounds heard. Central nervous system: sleeping Extremities: Symmetric 5 x 5 power. Skin: No rashes, lesions or ulcers      Data Reviewed: I have personally reviewed following labs and imaging studies  CBC: Recent Labs  Lab 01/08/19 1916 01/09/19 0235 01/10/19 0231 01/11/19 0834 01/13/19 1630 01/14/19 0720  WBC 13.7* 11.4* 9.1 8.9 16.4* 13.5*  NEUTROABS 9.5*  --   --   --  13.4*  --   HGB 11.7* 11.0* 10.5* 11.7* 12.8* 12.0*  HCT 38.7* 37.6* 35.0* 38.0* 42.8 39.5  MCV 97.7 99.7 99.4 95.2 98.2 96.6  PLT 390 401* 394 297 539* [id number]   Basic Metabolic Panel: Recent Labs  Lab 01/09/19 0235 01/10/19 0231 01/11/19 0834 01/11/19 1418 01/13/19 1630 01/14/19 0732  NA 138 139 140 140 140 141  K 4.3 4.1 5.7* 5.3* 4.6 4.1  CL 102 105 104 104 100 105  CO2 28 26 26 28 27 26   GLUCOSE 71 103* 113* 85 91 91  BUN 31* 24* 17 16 22 23   CREATININE 1.12 1.19 0.96 0.91 1.02 1.05  CALCIUM 9.3 9.2 9.7 9.7 10.2 9.2  MG 2.2  --   --   --   --   --   PHOS 3.6  --   --   --   --   --    GFR: Estimated Creatinine Clearance: 65.8 mL/min (by C-G formula based on SCr of 1.05 mg/dL). Liver Function Tests: Recent Labs  Lab 01/08/19 1916 01/09/19 0235 01/10/19 0231 01/11/19 0834 01/13/19 1630  AST 28 14* 14* 18 18  ALT 10 13 11 10 15   ALKPHOS 72 70 61 72 79  BILITOT 0.9 0.4 0.5 0.1* 0.3  PROT 7.6 7.2 6.7 7.7 8.7*  ALBUMIN 3.2* 3.1* 3.0* 3.4* 3.8   Recent Labs  Lab 01/08/19 1916  LIPASE 20   No results for input(s): AMMONIA in the last 168 hours. Coagulation Profile: Recent Labs  Lab 01/13/19 2240  INR 1.0   Cardiac Enzymes: No results for input(s): CKTOTAL, CKMB, CKMBINDEX, TROPONINI in the last 168 hours. BNP (last 3 results)  No results for input(s): PROBNP in the last 8760 hours. HbA1C: No results for input(s): HGBA1C in the last 72 hours. CBG: Recent Labs  Lab 01/11/19 0126 01/11/19 0803 01/11/19 1145 01/11/19 1634 01/14/19 0546  GLUCAP 95 94 99 98 99   Lipid Profile: No results for input(s): CHOL, HDL, LDLCALC, TRIG, CHOLHDL, LDLDIRECT in the last 72 hours. Thyroid Function Tests: No results for input(s): TSH, T4TOTAL, FREET4, T3FREE, THYROIDAB in the last 72 hours. Anemia Panel: No results for input(s): VITAMINB12, FOLATE, FERRITIN, TIBC, IRON, RETICCTPCT in the last 72 hours. Sepsis Labs: No results for input(s): PROCALCITON, LATICACIDVEN in the last 168 hours.  Recent Results (from the past 240 hour(s))  Urine culture     Status: Abnormal   Collection Time: 01/08/19  6:54  PM   Specimen: Urine, Random  Result Value Ref Range Status   Specimen Description   Final    URINE, RANDOM Performed at [facility] [address], [place], [place] [address]    Special Requests   Final    NONE Performed at [facility], [place] [address] [place], [place] [address]    Culture 40,000 COLONIES/mL YEAST (A)  Final   Report Status 01/11/2019 FINAL  Final  Blood culture (routine x 2)     Status: None   Collection Time: 01/08/19  7:45 PM   Specimen: BLOOD RIGHT FOREARM  Result Value Ref Range Status   Specimen Description   Final    BLOOD RIGHT FOREARM Performed at [facility], [place] [address] [place], [place] [address]    Special Requests   Final    BOTTLES DRAWN AEROBIC AND ANAEROBIC Blood Culture adequate volume Performed at [facility] [address], [place], [place] [address]    Culture   Final    NO GROWTH 5 DAYS Performed at [facility], [place] [address], [place], [place] [address]    Report Status 01/14/2019 FINAL  Final  SARS Coronavirus 2 [facility] order, Performed in [facility] hospital lab) Nasopharyngeal  Nasopharyngeal Swab     Status: None   Collection Time: 01/08/19  7:46 PM   Specimen: Nasopharyngeal Swab  Result Value Ref Range Status   SARS Coronavirus 2 NEGATIVE NEGATIVE Final    Comment: (NOTE) If result is NEGATIVE SARS-CoV-2 target nucleic acids are NOT DETECTED. The SARS-CoV-2 RNA is generally detectable in upper and lower  respiratory specimens during the acute phase of infection. The lowest  concentration of SARS-CoV-2 viral copies this assay can detect is 250  copies / mL. A negative result does not preclude SARS-CoV-2 infection  and should not be used as the sole basis for treatment or other  patient management decisions.  A negative result may occur with  improper specimen collection / handling, submission of specimen other  than nasopharyngeal swab, presence of viral mutation(s) within the  areas targeted by this assay, and inadequate number of viral copies  (<250 copies / mL). A negative result must be combined with clinical  observations, patient history, and epidemiological information. If result is POSITIVE SARS-CoV-2 target nucleic acids are DETECTED. The SARS-CoV-2 RNA is generally detectable in upper and lower  respiratory specimens dur ing the acute phase of infection.  Positive  results are indicative of active infection with SARS-CoV-2.  Clinical  correlation with patient history and other diagnostic information is  necessary to determine patient infection status.  Positive results do  not rule out bacterial infection or co-infection with other viruses. If result is PRESUMPTIVE POSTIVE SARS-CoV-2 nucleic acids MAY BE PRESENT.   A presumptive positive result was obtained on the submitted specimen  and confirmed on repeat testing.  While 2019 novel coronavirus  (SARS-CoV-2) nucleic acids may be present in the submitted sample  additional confirmatory testing may be necessary for epidemiological  and / or clinical management purposes  to differentiate between   SARS-CoV-2 and other Sarbecovirus currently known to infect humans.  If clinically indicated additional testing with an alternate test  methodology [phone]) is advised. The SARS-CoV-2 RNA is generally  detectable in upper and lower respiratory sp ecimens during the acute  phase of infection. The expected result is Negative. Fact Sheet for Patients:  StrictlyIdeas.no Fact Sheet for Healthcare Providers: BankingDealers.co.za This test  is not yet approved or cleared by the [place] and has been authorized for detection and/or diagnosis of SARS-CoV-2 by FDA under an Emergency Use Authorization (EUA).  This EUA will remain in effect (meaning this test can be used) for the duration of the COVID-19 declaration under Section 564(b)(1) of the Act, 21 U.S.C. section 360bbb-3(b)(1), unless the authorization is terminated or revoked sooner. Performed at [facility], [place] [address], [place], [place] [address]   MRSA PCR Screening     Status: None   Collection Time: 01/09/19  1:31 AM   Specimen: Nasal Mucosa; Nasopharyngeal  Result Value Ref Range Status   MRSA by PCR NEGATIVE NEGATIVE Final    Comment:        The GeneXpert MRSA Assay (FDA approved for NASAL specimens only), is one component of a comprehensive MRSA colonization surveillance program. It is not intended to diagnose MRSA infection nor to guide or monitor treatment for MRSA infections. Performed at [facility], [place] [address] [place], [place] [address]   Blood culture (routine x 2)     Status: None   Collection Time: 01/09/19  1:52 AM   Specimen: BLOOD RIGHT HAND  Result Value Ref Range Status   Specimen Description   Final    BLOOD RIGHT HAND Performed at [facility] [address] [place], [place] [address]    Special Requests   Final    BOTTLES DRAWN AEROBIC ONLY Blood Culture adequate volume  Performed at [facility] [address] [place], [place] [address]    Culture   Final    NO GROWTH 5 DAYS Performed at [facility], [place] [address] [place], [place] [address]    Report Status 01/14/2019 FINAL  Final  SARS CORONAVIRUS 2 (TAT 6-24 HRS) Nasopharyngeal Nasopharyngeal Swab     Status: None   Collection Time: 01/13/19  9:41 PM   Specimen: Nasopharyngeal Swab  Result Value Ref Range Status   SARS Coronavirus 2 NEGATIVE NEGATIVE Final    Comment: (NOTE) SARS-CoV-2 target nucleic acids are NOT DETECTED. The SARS-CoV-2 RNA is generally detectable in upper and lower respiratory specimens during the acute phase of infection. Negative results do not preclude SARS-CoV-2 infection, do not rule out co-infections with other pathogens, and should not be used as the sole basis for treatment or other patient management decisions. Negative results must be combined with clinical observations, patient history, and epidemiological information. The expected result is Negative. Fact Sheet for Patients: SugarRoll.be Fact Sheet for Healthcare Providers: https://www.woods-.com/ This test is not yet approved or cleared by the [place] FDA and  has been authorized for detection and/or diagnosis of SARS-CoV-2 by FDA under an Emergency Use Authorization (EUA). This EUA will remain  in effect (meaning this test can be used) for the duration of the COVID-19 declaration under Section 56 4(b)(1) of the Act, 21 U.S.C. section 360bbb-3(b)(1), unless the authorization is terminated or revoked sooner. Performed at [facility], [place] [address] [place], [place] [address]          Radiology Studies: Dg Chest Portable 1 View  Result Date: 01/13/2019 CLINICAL DATA:  Aspiration.  Shortness of breath. EXAM: PORTABLE CHEST 1 VIEW COMPARISON:  January 08, 2019 FINDINGS: A tracheostomy tube is stable. The  cardiomediastinal silhouette is stable. No pneumothorax. No nodules or masses. Vascular crowding in the medial right lung base is stable. No focal infiltrate. IMPRESSION: No active disease. Electronically Signed  By: Dorise Bullion III M.D   On: 01/13/2019 16:58        Scheduled Meds: . amLODipine  10 mg Per Tube Daily  . ferrous sulfate  325 mg Per Tube Daily  . free water  100 mL Per Tube Q3H  . gabapentin  100 mg Oral TID  . glycopyrrolate  1 mg Per Tube BID  . metoprolol tartrate  50 mg Oral BID  . mirtazapine  7.5 mg Oral QHS  . montelukast  10 mg Per Tube Daily  . [START ON 01/17/2019] pantoprazole  40 mg Intravenous Q12H  . pravastatin  20 mg Per Tube Daily  . vitamin C  500 mg Oral Daily   Continuous Infusions: . sodium chloride 100 mL/hr at 01/14/19 0553  . pantoprozole (PROTONIX) infusion 8 mg/hr (01/14/19 0555)  . piperacillin-tazobactam (ZOSYN)  IV Stopped (01/14/19 [id number])     LOS: 1 day     Georgette Shell, MD [facility]  If 7PM-7AM, please contact night-coverage [email or web address] Password [email or web address] 01/14/2019, 12:30 PM

## 2019-01-14 NOTE — Progress Notes (Signed)
CRITICAL VALUE ALERT  Critical Value:  2.8 lactic acid   Date & Time Notied:  01/14/2019 1800   Provider Notified: Dr. Rodena Piety   Orders Received/Actions taken: repeat lactic ordered

## 2019-01-14 NOTE — ED Notes (Signed)
Pt had one episode of coffee ground emesis. Pt's linen, gown and trach collar were changed. Hospitalist aware.

## 2019-01-15 LAB — GLUCOSE, CAPILLARY
Glucose-Capillary: 78 mg/dL (ref 70–99)
Glucose-Capillary: 79 mg/dL (ref 70–99)
Glucose-Capillary: 105 mg/dL — ABNORMAL HIGH (ref 70–99)
Glucose-Capillary: 72 mg/dL (ref 70–99)

## 2019-01-15 LAB — CBC
HCT: 33.6 % — ABNORMAL LOW (ref 39.0–52.0)
Hemoglobin: 10.3 g/dL — ABNORMAL LOW (ref 13.0–17.0)
MCH: 29.9 pg (ref 26.0–34.0)
MCHC: 30.7 g/dL (ref 30.0–36.0)
MCV: 97.7 fL (ref 80.0–100.0)
Platelets: 426 10*3/uL — ABNORMAL HIGH (ref 150–400)
RBC: 3.44 MIL/uL — ABNORMAL LOW (ref 4.22–5.81)
RDW: 14 % (ref 11.5–15.5)
WBC: 11.2 10*3/uL — ABNORMAL HIGH (ref 4.0–10.5)
nRBC: 0 % (ref 0.0–0.2)

## 2019-01-15 LAB — URINE CULTURE: Culture: >=100000 — AB

## 2019-01-15 MED ORDER — OSMOLITE 1.2 CAL PO LIQD: 1000.0000 mL | ORAL | 6 refills | Status: DC

## 2019-01-15 MED ORDER — PRO-STAT SUGAR FREE PO LIQD: 30.0000 mL | Freq: Two times a day (BID) | ORAL | 0 refills | Status: DC

## 2019-01-15 MED ORDER — ONDANSETRON HCL 4 MG PO TABS: 4.0000 mg | ORAL_TABLET | Freq: Four times a day (QID) | ORAL | 0 refills | Status: DC | PRN

## 2019-01-15 MED ORDER — FLUCONAZOLE 100 MG PO TABS: 200.0000 mg | ORAL_TABLET | Freq: Every day | ORAL | 0 refills | Status: AC

## 2019-01-15 MED ORDER — CLONIDINE 0.1 MG/24HR TD PTWK: 0.1000 mg | MEDICATED_PATCH | TRANSDERMAL | 1 refills | Status: AC

## 2019-01-15 MED ORDER — FLUCONAZOLE IN SODIUM CHLORIDE 200-0.9 MG/100ML-% IV SOLN
200.0000 mg | Freq: Once | INTRAVENOUS | Status: AC
  Administered 2019-01-15: 200 mg via INTRAVENOUS
  Filled 2019-01-15: qty 100

## 2019-01-15 MED ORDER — OSMOLITE 1.2 CAL PO LIQD
1000.0000 mL | ORAL | Status: DC
  Filled 2019-01-15 (×2): qty 1000

## 2019-01-15 MED ORDER — PRO-STAT SUGAR FREE PO LIQD: 30.0000 mL | Freq: Two times a day (BID) | ORAL | Status: DC

## 2019-01-15 NOTE — Progress Notes (Signed)
Patient discharging home.  IVs removed - WNL.  Reviewed AVS and meds with sister over phone.  Verbalizes understanding.  [facility] called by SW - awaiting for transport.  Patient in NAD at this time.  Refused changing of gauze around trach - soiled with phlegm.

## 2019-01-15 NOTE — Discharge Summary (Addendum)
Physician Discharge Summary  Scott Rivera [id number] DOB: 1951/04/13 DOA: 01/13/2019  PCP: [facility]  Admit date: 01/13/2019 Discharge date: 01/15/2019  Admitted From: Home Disposition: Home Recommendations for Outpatient Follow-up:  1. Follow up with PCP in 1-2 weeks 2. Please obtain BMP/CBC in one week  Home Health: None Equipment/Devices: None  Discharge Condition stable CODE STATUS full code Diet recommendation: Tube feeds Brief/Interim Summary:68 y.o.malewith medical history significant ofhypertension, hyperlipidemia, diet-controlled diabetes, stroke, nonverbal status, PEG tube, tracheostomy, pneumothorax, CKD stage II, iron deficiency anemia, depression, who presents with nausea, vomiting, diarrhea, hematemesis.  Patient is nonverbal,andcannot provide any medical history. I called her sister, who provided history. Patient was recently hospitalized from 9/23-9/26 due to aspiration pneumonia which was due to Pseudomonas and Klebsiella and possible UTI. Patient was treated with Zosyn and discharged on Augmentin. Per his sister, pthas been having nauseaandvomiting, with coffee-ground black vomitus. Patient also has 2-4 time ofdiarrheaeach day with dark brownstool. He did not complain abdominal pain per his sisters. Patient does not seem to have chest pain, shortness breath, cough per her sister. Did not complains of symptoms of UTI. Patient has a Foley catheter placed today. Of note, pt had brownemesis in previous admission,GIwasconsulted, thought was likelydue to known esophagitis. Pt continuedon PPI as tolerated.  ED Course:pt was found to have positive urinalysis (cloudy appearance, large amount of leukocyte, rare bacteria, WBC>50), Hgb 12.8 <--11.7 on 9/26,pending COVID-19 test, pending FOBT, WBC 16.4, electrolytes renal function okay, temperature normal, blood pressure 157/104, heart rate 10 8, oxygen saturation 89 to 95% on room air.  Chest x-ray is negative for infiltration. Patient is admitted to telemetry bed as inpatient.   Discharge Diagnoses:  Principal Problem:   Hematemesis Active Problems:   Essential hypertension   History of cerebral infarction   CKD (chronic kidney disease) stage 2, GFR 60-89 ml/min   Sepsis ([place])   Anemia due to chronic kidney disease   History of tracheostomy   UTI (urinary tract infection)   Tracheostomy tube present (HCC)   Chronic respiratory insufficiency   Aspiration pneumonia (HCC)   Nausea vomiting and diarrhea   #1 hematemesis patient has had no further hematemesis since coming into the hospital.  His hemoglobin has been stable.  His hemoglobin on the day of discharge is 10.3 down from the time of admission as he was on IV fluids continuously.   His hemoglobin on the day of discharge from the hospital was 11.7 on 01/11/2019.  And on 01/13/2019 he was readmitted with a hemoglobin of 12.8.  Patient has had no further bowel movements even to do FOBT.  No nausea vomiting.  Discussed with GI over the phone no plans for further work-up.    #2 sepsis present on admission secondary to yeast UTI.  Patient was recently discharged home on Augmentin for aspiration pneumonia unclear if urine culture is Masked by outpatient antibiotic treatment.  His UA certainly appeared dirty with large amount of leukocytes more than 50 WBCs.  At the time of admission he was tachypneic tachycardic with elevated lactic acid and leukocytosis.  Patient with recent aspiration pneumonia secondary to Pseudomonas and Klebsiella treated with Zosyn and discharged on Augmentin.  He will be discharged home on Diflucan 200 mg daily for 14 days.  #3 hypertension  continue home medications.  #4 history of stroke on Plavix continue Plavix and statin.  #45CKD stage II stable  #6recent aspiration pneumonia history of trach- cxr nad  #7 anemia chronic secondary to CKD hemoglobin stable  #  8 reported nausea vomiting  diarrhea by family no episodes of nausea vomiting in the hospital.  #9 he carries a diagnosis of diet-controlled diabetes which is stable on no treatment.  #10 patient has trach PEG and Foley catheter.   Estimated body mass index is 28.79 kg/m as calculated from the following:   Height as of this encounter: 5\' 6"  (1.676 m).   Weight as of this encounter: 80.9 kg.  Discharge Instructions  Discharge Instructions    Call MD for:  difficulty breathing, headache or visual disturbances   Complete by: As directed    Call MD for:  temperature >100.4   Complete by: As directed    Diet - low sodium heart healthy   Complete by: As directed    Increase activity slowly   Complete by: As directed      Allergies as of 01/15/2019   No Known Allergies     Medication List    STOP taking these medications   amoxicillin-clavulanate 400-57 MG/5ML suspension Commonly known as: AUGMENTIN   free water Soln   metoCLOPramide 10 MG/10ML Soln Commonly known as: REGLAN   scopolamine 1 MG/3DAYS Commonly known as: TRANSDERM-SCOP     TAKE these medications   acetaminophen 500 MG tablet Commonly known as: TYLENOL Place 1,000 mg into feeding tube 3 (three) times daily.   amLODipine 10 MG tablet Commonly known as: NORVASC Place 1 tablet (10 mg total) into feeding tube daily.   cloNIDine 0.1 mg/24hr patch Commonly known as: CATAPRES - Dosed in mg/24 hr Place 1 patch (0.1 mg total) onto the skin every Tuesday. Start taking on: January 21, 2019   clopidogrel 75 MG tablet Commonly known as: PLAVIX Place 1 tablet (75 mg total) into feeding tube daily.   feeding supplement (JEVITY 1.2 CAL) Liqd Initiate at 35 ml/hr and increase by 10 ml every 8 hours to reach goal rate of 65 ml/hr.   gabapentin 100 MG capsule Commonly known as: NEURONTIN Take 100 mg by mouth 3 (three) times daily. Open 1 capsule (100 mg) and administer contents via feeding tube three times daily   glycopyrrolate 1 MG  tablet Commonly known as: ROBINUL Place 1 mg into feeding tube 2 (two) times daily.   Iron 325 (65 Fe) MG Tabs Place 325 mg into feeding tube daily.   metoprolol tartrate 50 MG tablet Commonly known as: LOPRESSOR Take 1 tablet (50 mg total) by mouth 2 (two) times daily.   mirtazapine 15 MG tablet Commonly known as: REMERON Take 7.5 mg by mouth at bedtime.   montelukast 10 MG tablet Commonly known as: SINGULAIR Place 10 mg into feeding tube daily.   ondansetron 4 MG tablet Commonly known as: ZOFRAN Place 1 tablet (4 mg total) into feeding tube every 6 (six) hours as needed for nausea.   pantoprazole sodium 40 mg/20 mL Pack Commonly known as: PROTONIX Place 20 mLs (40 mg total) into feeding tube 2 (two) times daily.   pravastatin 20 MG tablet Commonly known as: PRAVACHOL Place 1 tablet (20 mg total) into feeding tube daily at 6 PM. What changed: when to take this   vitamin C 500 MG tablet Commonly known as: ASCORBIC ACID Take 500 mg by mouth daily.      [facility] Follow up.   Contact information: [place] [address] [phone]          No Known Allergies  Consultations:  None   Procedures/Studies: Dg  Chest 2 View  Result Date: 01/08/2019 CLINICAL DATA:  Pt was admitted from 9/15-9/17 with an upper GI bleed. He underwent an endoscopy on 9/16. Sister reported that she was notified that pt had "abnormal abdominal results" from the gastrologist. EXAM: CHEST - 2 VIEW COMPARISON:  Chest radiograph 01/01/2019, 12/31/2018 FINDINGS: Tracheostomy in place. Stable cardiomediastinal contours. Central vascular congestion with diffuse bilateral prominent interstitial markings. Low volume study. Slightly increased opacity at the medial right lung base suspicious for infection. Scattered atelectasis no pneumothorax or large pleural effusion. No acute finding in the visualized skeleton. IMPRESSION: 1. Mildly  increased opacity at the medial right lung base suspicious for infection. 2. Central vascular congestion with probable trace edema and atelectasis. Electronically Signed   By: Audie Pinto M.D.   On: 01/08/2019 18:41   Dg Abdomen Peg Tube Location  Result Date: 12/31/2018 CLINICAL DATA:  68 year old male with PEG tube. Evaluate for patency. EXAM: ABDOMEN - 1 VIEW COMPARISON:  Abdominal radiograph dated 11/18/2018 FINDINGS: Contrast injected via the percutaneous gastrostomy is noted within the stomach and duodenal C-loop. No extraluminal contrast identified. There is large amount of stool throughout the colon. No bowel dilatation. There is degenerative changes of the spine. Midline surgical staples noted. IMPRESSION: Percutaneous gastrostomy within the distal stomach. No extraluminal contrast identified. Electronically Signed   By: Anner Crete M.D.   On: 12/31/2018 09:44   Dg Chest Portable 1 View  Result Date: 01/13/2019 CLINICAL DATA:  Aspiration.  Shortness of breath. EXAM: PORTABLE CHEST 1 VIEW COMPARISON:  January 08, 2019 FINDINGS: A tracheostomy tube is stable. The cardiomediastinal silhouette is stable. No pneumothorax. No nodules or masses. Vascular crowding in the medial right lung base is stable. No focal infiltrate. IMPRESSION: No active disease. Electronically Signed   By: Dorise Bullion III M.D   On: 01/13/2019 16:58   Dg Chest Port 1 View  Result Date: 01/01/2019 CLINICAL DATA:  Pneumonia. EXAM: PORTABLE CHEST 1 VIEW COMPARISON:  Chest x-ray from yesterday. FINDINGS: Unchanged tracheostomy tube with tip at the thoracic inlet. The heart size and mediastinal contours are within normal limits. New mild pulmonary vascular congestion. Unchanged opacity at the medial right lung base. No pleural effusion or pneumothorax. No acute osseous abnormality. 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 IMPRESSION: 1. New mild pulmonary vascular congestion. 2. Unchanged right medial  basilar atelectasis versus infiltrate. Electronically Signed   By: Titus Dubin M.D.   On: 01/01/2019 08:20   Dg Chest Portable 1 View  Result Date: 12/31/2018 CLINICAL DATA:  Increased congestion. EXAM: PORTABLE CHEST 1 VIEW COMPARISON:  11/24/2018. FINDINGS: Tracheostomy tube stable position. Heart size normal. Low lung volumes with mild bibasilar atelectasis/infiltrates. No pleural effusion or pneumothorax. Surgical clips left upper scratched it surgical clips left chest. 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 IMPRESSION: 1.  Tracheostomy tube stable position. 2.  Low lung volumes with mild bibasilar atelectasis/infiltrates. 3. Sclerotic changes left humeral head consistent with avascular necrosis. Electronically Signed   By: Marcello Moores  Register   On: 12/31/2018 06:25    ([place], Carotid, EGD, Colonoscopy, ERCP)    Subjective: Resting in bed refusing care by nursing staff  Discharge Exam: Vitals:   01/15/19 0817 01/15/19 1138  BP:    Pulse:  73  Resp:  18  Temp:    SpO2: 97% 93%   Vitals:   01/15/19 0359 01/15/19 0816 01/15/19 0817 01/15/19 1138  BP: (!) 158/82     Pulse: 87   73  Resp: 16   18  Temp:      TempSrc:      SpO2: 98% 97% 97% 93%  Weight: 80.9 kg     Height:        General: Pt is alert, awake, not in acute distress Cardiovascular: RRR, S1/S2 +, no rubs, no gallops Respiratory: CTA bilaterally, no wheezing, no rhonchi trach in place Abdominal: Soft, NT, ND, bowel sounds + PEG in place Extremities: no edema, no cyanosis    The results of significant diagnostics from this hospitalization (including imaging, microbiology, ancillary and laboratory) are listed below for reference.     Microbiology: Recent Results (from the past 240 hour(s))  Urine culture     Status: Abnormal   Collection Time: 01/08/19  6:54 PM   Specimen: Urine, Random  Result Value Ref Range Status   Specimen Description   Final    URINE,  RANDOM Performed at [facility] [address] [place], [place] [address]    Special Requests   Final    NONE Performed at [facility], [place] [address], [place], [place] [address]    Culture 40,000 COLONIES/mL YEAST (A)  Final   Report Status 01/11/2019 FINAL  Final  Blood culture (routine x 2)     Status: None   Collection Time: 01/08/19  7:45 PM   Specimen: BLOOD RIGHT FOREARM  Result Value Ref Range Status   Specimen Description   Final    BLOOD RIGHT FOREARM Performed at [facility], [place] [address], [place], [place] [address]    Special Requests   Final    BOTTLES DRAWN AEROBIC AND ANAEROBIC Blood Culture adequate volume Performed at [facility] [address], [place], [place] [address]    Culture   Final    NO GROWTH 5 DAYS Performed at [facility], [place] [address] [place], [place] [address]    Report Status 01/14/2019 FINAL  Final  SARS Coronavirus 2 [facility] order, Performed in [facility] hospital lab) Nasopharyngeal Nasopharyngeal Swab     Status: None   Collection Time: 01/08/19  7:46 PM   Specimen: Nasopharyngeal Swab  Result Value Ref Range Status   SARS Coronavirus 2 NEGATIVE NEGATIVE Final    Comment: (NOTE) If result is NEGATIVE SARS-CoV-2 target nucleic acids are NOT DETECTED. The SARS-CoV-2 RNA is generally detectable in upper and lower  respiratory specimens during the acute phase of infection. The lowest  concentration of SARS-CoV-2 viral copies this assay can detect is 250  copies / mL. A negative result does not preclude SARS-CoV-2 infection  and should not be used as the sole basis for treatment or other  patient management decisions.  A negative result may occur with  improper specimen collection / handling, submission of specimen other  than nasopharyngeal swab, presence of viral mutation(s) within the  areas targeted by this assay, and inadequate number of viral copies   (<250 copies / mL). A negative result must be combined with clinical  observations, patient history, and epidemiological information. If result is POSITIVE SARS-CoV-2 target nucleic acids are DETECTED. The SARS-CoV-2 RNA is generally detectable in upper and lower  respiratory specimens dur ing the acute phase of infection.  Positive  results are indicative of active infection with SARS-CoV-2.  Clinical  correlation with patient history and other diagnostic information is  necessary to determine patient infection status.  Positive results do  not rule out bacterial infection or co-infection with other  viruses. If result is PRESUMPTIVE POSTIVE SARS-CoV-2 nucleic acids MAY BE PRESENT.   A presumptive positive result was obtained on the submitted specimen  and confirmed on repeat testing.  While 2019 novel coronavirus  (SARS-CoV-2) nucleic acids may be present in the submitted sample  additional confirmatory testing may be necessary for epidemiological  and / or clinical management purposes  to differentiate between  SARS-CoV-2 and other Sarbecovirus currently known to infect humans.  If clinically indicated additional testing with an alternate test  methodology [phone]) is advised. The SARS-CoV-2 RNA is generally  detectable in upper and lower respiratory sp ecimens during the acute  phase of infection. The expected result is Negative. Fact Sheet for Patients:  StrictlyIdeas.no Fact Sheet for Healthcare Providers: BankingDealers.co.za This test is not yet approved or cleared by the [place] FDA and has been authorized for detection and/or diagnosis of SARS-CoV-2 by FDA under an Emergency Use Authorization (EUA).  This EUA will remain in effect (meaning this test can be used) for the duration of the COVID-19 declaration under Section 564(b)(1) of the Act, 21 U.S.C. section 360bbb-3(b)(1), unless the authorization is terminated  or revoked sooner. Performed at [facility], [place] [address], [place], [place] [address]   MRSA PCR Screening     Status: None   Collection Time: 01/09/19  1:31 AM   Specimen: Nasal Mucosa; Nasopharyngeal  Result Value Ref Range Status   MRSA by PCR NEGATIVE NEGATIVE Final    Comment:        The GeneXpert MRSA Assay (FDA approved for NASAL specimens only), is one component of a comprehensive MRSA colonization surveillance program. It is not intended to diagnose MRSA infection nor to guide or monitor treatment for MRSA infections. Performed at [facility], [place] [address], [place], [place] [address]   Blood culture (routine x 2)     Status: None   Collection Time: 01/09/19  1:52 AM   Specimen: BLOOD RIGHT HAND  Result Value Ref Range Status   Specimen Description   Final    BLOOD RIGHT HAND Performed at [facility] [address], [place], [place] [address]    Special Requests   Final    BOTTLES DRAWN AEROBIC ONLY Blood Culture adequate volume Performed at [facility] [address] [place], [place] [address]    Culture   Final    NO GROWTH 5 DAYS Performed at [facility], [place] [address] [place], [place] [address]    Report Status 01/14/2019 FINAL  Final  Urine Culture     Status: Abnormal   Collection Time: 01/13/19  8:53 PM   Specimen: Urine, Clean Catch  Result Value Ref Range Status   Specimen Description   Final    URINE, CLEAN CATCH Performed at [facility], [place] [address], [place], [place] [address]    Special Requests   Final    NONE Performed at [facility], [place] [address], [place],  [address]    Culture >=100,000 COLONIES/mL YEAST (A)  Final   Report Status 01/15/2019 FINAL  Final  SARS CORONAVIRUS 2 (TAT 6-24 HRS) Nasopharyngeal Nasopharyngeal Swab     Status: None   Collection Time: 01/13/19  9:41 PM   Specimen:  Nasopharyngeal Swab  Result Value Ref Range Status   SARS Coronavirus 2 NEGATIVE NEGATIVE Final    Comment: (NOTE) SARS-CoV-2 target nucleic acids are NOT DETECTED. The SARS-CoV-2 RNA is generally detectable in  upper and lower respiratory specimens during the acute phase of infection. Negative results do not preclude SARS-CoV-2 infection, do not rule out co-infections with other pathogens, and should not be used as the sole basis for treatment or other patient management decisions. Negative results must be combined with clinical observations, patient history, and epidemiological information. The expected result is Negative. Fact Sheet for Patients: SugarRoll.be Fact Sheet for Healthcare Providers: https://www.woods-.com/ This test is not yet approved or cleared by the [place] FDA and  has been authorized for detection and/or diagnosis of SARS-CoV-2 by FDA under an Emergency Use Authorization (EUA). This EUA will remain  in effect (meaning this test can be used) for the duration of the COVID-19 declaration under Section 56 4(b)(1) of the Act, 21 U.S.C. section 360bbb-3(b)(1), unless the authorization is terminated or revoked sooner. Performed at [facility], [place] [address] [place], [place] [address]   Culture, blood (x 2)     Status: None (Preliminary result)   Collection Time: 01/14/19  7:20 AM   Specimen: BLOOD RIGHT HAND  Result Value Ref Range Status   Specimen Description   Final    BLOOD RIGHT HAND Performed at [facility] [address], [place], [place] [address]    Special Requests   Final    BOTTLES DRAWN AEROBIC ONLY Blood Culture results may not be optimal due to an inadequate volume of blood received in culture bottles Performed at [facility] [address] [place], [place] [address]    Culture   Final    NO GROWTH < 24 HOURS Performed at [facility] [address], [place], [place] [address]    Report Status PENDING  Incomplete  Culture, blood (x 2)     Status: None (Preliminary result)   Collection Time: 01/14/19  7:32 AM   Specimen: BLOOD LEFT HAND  Result Value Ref Range Status   Specimen Description   Final    BLOOD LEFT HAND Performed at [facility] [address], [place], [place] [address]    Special Requests   Final    BOTTLES DRAWN AEROBIC ONLY Blood Culture results may not be optimal due to an inadequate volume of blood received in culture bottles Performed at [facility] [address], [place], [place] [address]    Culture   Final    NO GROWTH < 24 HOURS Performed at [facility] [address], [place], [place] [address]    Report Status PENDING  Incomplete     Labs: BNP (last 3 results) No results for input(s): BNP in the last 8760 hours. Basic Metabolic Panel: Recent Labs  Lab 01/09/19 0235 01/10/19 0231 01/11/19 0834 01/11/19 1418 01/13/19 1630 01/14/19 0732  NA 138 139 140 140 140 141  K 4.3 4.1 5.7* 5.3* 4.6 4.1  CL 102 105 104 104 100 105  CO2 28 26 26 28 27 26   GLUCOSE 71 103* 113* 85 91 91  BUN 31* 24* 17 16 22 23   CREATININE 1.12 1.19 0.96 0.91 1.02 1.05  CALCIUM 9.3 9.2 9.7 9.7 10.2 9.2  MG 2.2  --   --   --   --   --   PHOS 3.6  --   --   --   --   --    Liver Function Tests: Recent Labs  Lab 01/08/19 1916 01/09/19 0235 01/10/19 0231 01/11/19 0834 01/13/19 1630  AST 28 14*  14* 18 18  ALT 10 13 11 10 15   ALKPHOS 72 70 61 72 79  BILITOT 0.9 0.4 0.5 0.1* 0.3  PROT 7.6 7.2 6.7 7.7 8.7*  ALBUMIN 3.2* 3.1* 3.0* 3.4* 3.8   Recent Labs  Lab 01/08/19 1916  LIPASE 20   No results for input(s): AMMONIA in the last 168 hours. CBC: Recent Labs  Lab 01/08/19 1916  01/11/19 0834 01/13/19 1630 01/14/19 0720 01/14/19 1655 01/15/19 0650  WBC 13.7*   < > 8.9 16.4* 13.5* 12.2* 11.2*  NEUTROABS 9.5*  --   --  13.4*  --   --   --   HGB  11.7*   < > 11.7* 12.8* 12.0* 11.9* 10.3*  HCT 38.7*   < > 38.0* 42.8 39.5 40.3 33.6*  MCV 97.7   < > 95.2 98.2 96.6 101.3* 97.7  PLT 390   < > 297 539* 390 406* 426*   < > = values in this interval not displayed.   Cardiac Enzymes: No results for input(s): CKTOTAL, CKMB, CKMBINDEX, TROPONINI in the last 168 hours. BNP: Invalid input(s): POCBNP CBG: Recent Labs  Lab 01/14/19 2038 01/14/19 2347 01/15/19 0355 01/15/19 0759 01/15/19 1200  GLUCAP 85 72 78 79 105*   D-Dimer No results for input(s): DDIMER in the last 72 hours. Hgb A1c No results for input(s): HGBA1C in the last 72 hours. Lipid Profile No results for input(s): CHOL, HDL, LDLCALC, TRIG, CHOLHDL, LDLDIRECT in the last 72 hours. Thyroid function studies No results for input(s): TSH, T4TOTAL, T3FREE, THYROIDAB in the last 72 hours.  Invalid input(s): FREET3 Anemia work up No results for input(s): VITAMINB12, FOLATE, FERRITIN, TIBC, IRON, RETICCTPCT in the last 72 hours. Urinalysis    Component Value Date/Time   COLORURINE YELLOW 01/13/2019 2039   APPEARANCEUR CLOUDY (A) 01/13/2019 2039   LABSPEC 1.016 01/13/2019 2039   PHURINE 8.0 01/13/2019 2039   GLUCOSEU NEGATIVE 01/13/2019 2039   HGBUR NEGATIVE 01/13/2019 2039   BILIRUBINUR NEGATIVE 01/13/2019 2039   KETONESUR NEGATIVE 01/13/2019 2039   PROTEINUR >=300 (A) 01/13/2019 2039   NITRITE NEGATIVE 01/13/2019 2039   LEUKOCYTESUR LARGE (A) 01/13/2019 2039   Sepsis Labs Invalid input(s): PROCALCITONIN,  WBC,  LACTICIDVEN Microbiology Recent Results (from the past 240 hour(s))  Urine culture     Status: Abnormal   Collection Time: 01/08/19  6:54 PM   Specimen: Urine, Random  Result Value Ref Range Status   Specimen Description   Final    URINE, RANDOM Performed at [facility], [place] [address], [place], [place] [address]    Special Requests   Final    NONE Performed at [facility], [place] [address] [place], [place]  [address]    Culture 40,000 COLONIES/mL YEAST (A)  Final   Report Status 01/11/2019 FINAL  Final  Blood culture (routine x 2)     Status: None   Collection Time: 01/08/19  7:45 PM   Specimen: BLOOD RIGHT FOREARM  Result Value Ref Range Status   Specimen Description   Final    BLOOD RIGHT FOREARM Performed at [facility], [place] [address], [place], [place] [address]    Special Requests   Final    BOTTLES DRAWN AEROBIC AND ANAEROBIC Blood Culture adequate volume Performed at [facility] [address], [place], [place] [address]    Culture   Final    NO GROWTH 5 DAYS Performed at [facility], [place] [address],  [place], [place] [address]    Report Status 01/14/2019 FINAL  Final  SARS Coronavirus 2 [facility] order, Performed in [facility] hospital lab) Nasopharyngeal Nasopharyngeal Swab     Status: None   Collection Time: 01/08/19  7:46 PM   Specimen: Nasopharyngeal Swab  Result Value Ref Range Status   SARS Coronavirus 2 NEGATIVE NEGATIVE Final    Comment: (NOTE) If result is NEGATIVE SARS-CoV-2 target nucleic acids are NOT DETECTED. The SARS-CoV-2 RNA is generally detectable in upper and lower  respiratory specimens during the acute phase of infection. The lowest  concentration of SARS-CoV-2 viral copies this assay can detect is 250  copies / mL. A negative result does not preclude SARS-CoV-2 infection  and should not be used as the sole basis for treatment or other  patient management decisions.  A negative result may occur with  improper specimen collection / handling, submission of specimen other  than nasopharyngeal swab, presence of viral mutation(s) within the  areas targeted by this assay, and inadequate number of viral copies  (<250 copies / mL). A negative result must be combined with clinical  observations, patient history, and epidemiological information. If result is POSITIVE SARS-CoV-2 target nucleic acids are DETECTED. The SARS-CoV-2  RNA is generally detectable in upper and lower  respiratory specimens dur ing the acute phase of infection.  Positive  results are indicative of active infection with SARS-CoV-2.  Clinical  correlation with patient history and other diagnostic information is  necessary to determine patient infection status.  Positive results do  not rule out bacterial infection or co-infection with other viruses. If result is PRESUMPTIVE POSTIVE SARS-CoV-2 nucleic acids MAY BE PRESENT.   A presumptive positive result was obtained on the submitted specimen  and confirmed on repeat testing.  While 2019 novel coronavirus  (SARS-CoV-2) nucleic acids may be present in the submitted sample  additional confirmatory testing may be necessary for epidemiological  and / or clinical management purposes  to differentiate between  SARS-CoV-2 and other Sarbecovirus currently known to infect humans.  If clinically indicated additional testing with an alternate test  methodology [phone]) is advised. The SARS-CoV-2 RNA is generally  detectable in upper and lower respiratory sp ecimens during the acute  phase of infection. The expected result is Negative. Fact Sheet for Patients:  StrictlyIdeas.no Fact Sheet for Healthcare Providers: BankingDealers.co.za This test is not yet approved or cleared by the [place] FDA and has been authorized for detection and/or diagnosis of SARS-CoV-2 by FDA under an Emergency Use Authorization (EUA).  This EUA will remain in effect (meaning this test can be used) for the duration of the COVID-19 declaration under Section 564(b)(1) of the Act, 21 U.S.C. section 360bbb-3(b)(1), unless the authorization is terminated or revoked sooner. Performed at [facility], [place] [address], [place], [place] [address]   MRSA PCR Screening     Status: None   Collection Time: 01/09/19  1:31 AM   Specimen: Nasal Mucosa; Nasopharyngeal   Result Value Ref Range Status   MRSA by PCR NEGATIVE NEGATIVE Final    Comment:        The GeneXpert MRSA Assay (FDA approved for NASAL specimens only), is one component of a comprehensive MRSA colonization surveillance program. It is not intended to diagnose MRSA infection nor to guide or monitor treatment for MRSA infections. Performed at [facility], [place] [address] [place], [place] [address]   Blood culture (routine x 2)     Status: None  Collection Time: 01/09/19  1:52 AM   Specimen: BLOOD RIGHT HAND  Result Value Ref Range Status   Specimen Description   Final    BLOOD RIGHT HAND Performed at [place] [address], [place], [place] [address]    Special Requests   Final    BOTTLES DRAWN AEROBIC ONLY Blood Culture adequate volume Performed at [place] [address] [place], [place] [address]    Culture   Final    NO GROWTH 5 DAYS Performed at [facility], [place] [address], [place], [place] [address]    Report Status 01/14/2019 FINAL  Final  Urine Culture     Status: Abnormal   Collection Time: 01/13/19  8:53 PM   Specimen: Urine, Clean Catch  Result Value Ref Range Status   Specimen Description   Final    URINE, CLEAN CATCH Performed at [facility], [place] [address], [place], [place] [address]    Special Requests   Final    NONE Performed at [facility], [place] [address], [place], [place] [address]    Culture >=100,000 COLONIES/mL YEAST (A)  Final   Report Status 01/15/2019 FINAL  Final  SARS CORONAVIRUS 2 (TAT 6-24 HRS) Nasopharyngeal Nasopharyngeal Swab     Status: None   Collection Time: 01/13/19  9:41 PM   Specimen: Nasopharyngeal Swab  Result Value Ref Range Status   SARS Coronavirus 2 NEGATIVE NEGATIVE Final    Comment: (NOTE) SARS-CoV-2 target nucleic acids are NOT DETECTED. The SARS-CoV-2 RNA is generally detectable in upper and  lower respiratory specimens during the acute phase of infection. Negative results do not preclude SARS-CoV-2 infection, do not rule out co-infections with other pathogens, and should not be used as the sole basis for treatment or other patient management decisions. Negative results must be combined with clinical observations, patient history, and epidemiological information. The expected result is Negative. Fact Sheet for Patients: SugarRoll.be Fact Sheet for Healthcare Providers: https://www.woods-.com/ This test is not yet approved or cleared by the [place] FDA and  has been authorized for detection and/or diagnosis of SARS-CoV-2 by FDA under an Emergency Use Authorization (EUA). This EUA will remain  in effect (meaning this test can be used) for the duration of the COVID-19 declaration under Section 56 4(b)(1) of the Act, 21 U.S.C. section 360bbb-3(b)(1), unless the authorization is terminated or revoked sooner. Performed at [facility], [place] [address] [place], [place] [address]   Culture, blood (x 2)     Status: None (Preliminary result)   Collection Time: 01/14/19  7:20 AM   Specimen: BLOOD RIGHT HAND  Result Value Ref Range Status   Specimen Description   Final    BLOOD RIGHT HAND Performed at [place] [address], [place], [place] [address]    Special Requests   Final    BOTTLES DRAWN AEROBIC ONLY Blood Culture results may not be optimal due to an inadequate volume of blood received in culture bottles Performed at [place] [address], [place], [place] [address]    Culture   Final    NO GROWTH < 24 HOURS Performed at [place] [address], [place],  [address]    Report Status PENDING  Incomplete  Culture, blood (x 2)     Status: None (Preliminary result)   Collection Time: 01/14/19  7:32 AM   Specimen: BLOOD LEFT HAND  Result Value  Ref Range Status   Specimen Description   Final    BLOOD LEFT HAND Performed at [place] [address] [place], [place] [address]    Special Requests   Final    BOTTLES DRAWN AEROBIC ONLY Blood Culture results may not be optimal due to an inadequate volume of blood received in culture bottles Performed at [place] [address], [place], [place] [address]    Culture   Final    NO GROWTH < 24 HOURS Performed at [place] [address], [place], [place] [address]    Report Status PENDING  Incomplete     Time coordinating discharge:  39 minutes  SIGNED:   Georgette Shell, MD  [facility] 01/15/2019, 12:18 PM Pager   If 7PM-7AM, please contact night-coverage [email or web address] Password [email or web address]

## 2019-01-15 NOTE — Progress Notes (Signed)
Attempting to reposition and provide trach care to patient.  Patient becoming very agitated and saying "get out!"  Explained that he has a lot of secreation coming through tach right now with his coughing and he needed to be suctioned.  Attempted just to wipe secretions off neck and around site, patient refusing, hitting RN in face and pushing away, being very combative.  Will attempt again later when patient is calm

## 2019-01-15 NOTE — TOC Progression Note (Addendum)
Transition of Care [facility]) - Progression Note    Patient Details  Name: KHAMDEN MINISH MRN: [id number] Date of Birth: 1950-12-19  Transition of Care [facility]) CM/SW [place], LCSW Phone Number: 01/15/2019, 2:42 PM  Clinical Narrative:   Spoke with patient sister [name] via phone. Sister stated patient lives in the home with her and she assist him with his needs. [name] will follow patient for [facility] needs and [employer] has been made aware of patients new feeding orders. [facility] has been set up for a 4:30 pm pickup (sister aware)          Expected Discharge Plan and Services           Expected Discharge Date: 01/15/19                                     Social Determinants of Health (SDOH) Interventions    Readmission Risk Interventions No flowsheet data found.

## 2019-01-15 NOTE — Progress Notes (Addendum)
NUTRITION NOTE RD working remotely.   Consult for TF initiation and management. Patient familiar to this RD from admissions earlier in the month. Patient with TC and PEG. Order currently in place for Jevity 1.2 @ 50 ml/hr which provides 1440 kcal, 67 grams protein, and 1065 ml free water.   Will change order to Osmolite 1.5 @ 65 ml/hr with 30 ml prostat (or equivalent) once/day and 100 ml free water every 3 hours. This regimen will provide 2072 kcal, 116 grams protein, and 2079 ml free water.   Estimated Nutrition Needs: Kcal: 2025- 2265 kcal Protein: 100-110 grams Fluid: >/=1.8 L/day     Jarome Matin, MS, RD, LDN, [facility] Inpatient Clinical Dietitian Pager # [phone] After hours/weekend pager # [phone]

## 2019-01-19 LAB — CULTURE, BLOOD (ROUTINE X 2)
Culture: NO GROWTH
Culture: NO GROWTH

## 2019-01-26 IMAGING — DX DG CHEST 1V PORT
1 series · 1 of 1 positions shown · non-contrast
Comparison: November 20, 2017

CLINICAL DATA: Emesis.

EXAM:
PORTABLE CHEST 1 VIEW

[chest]
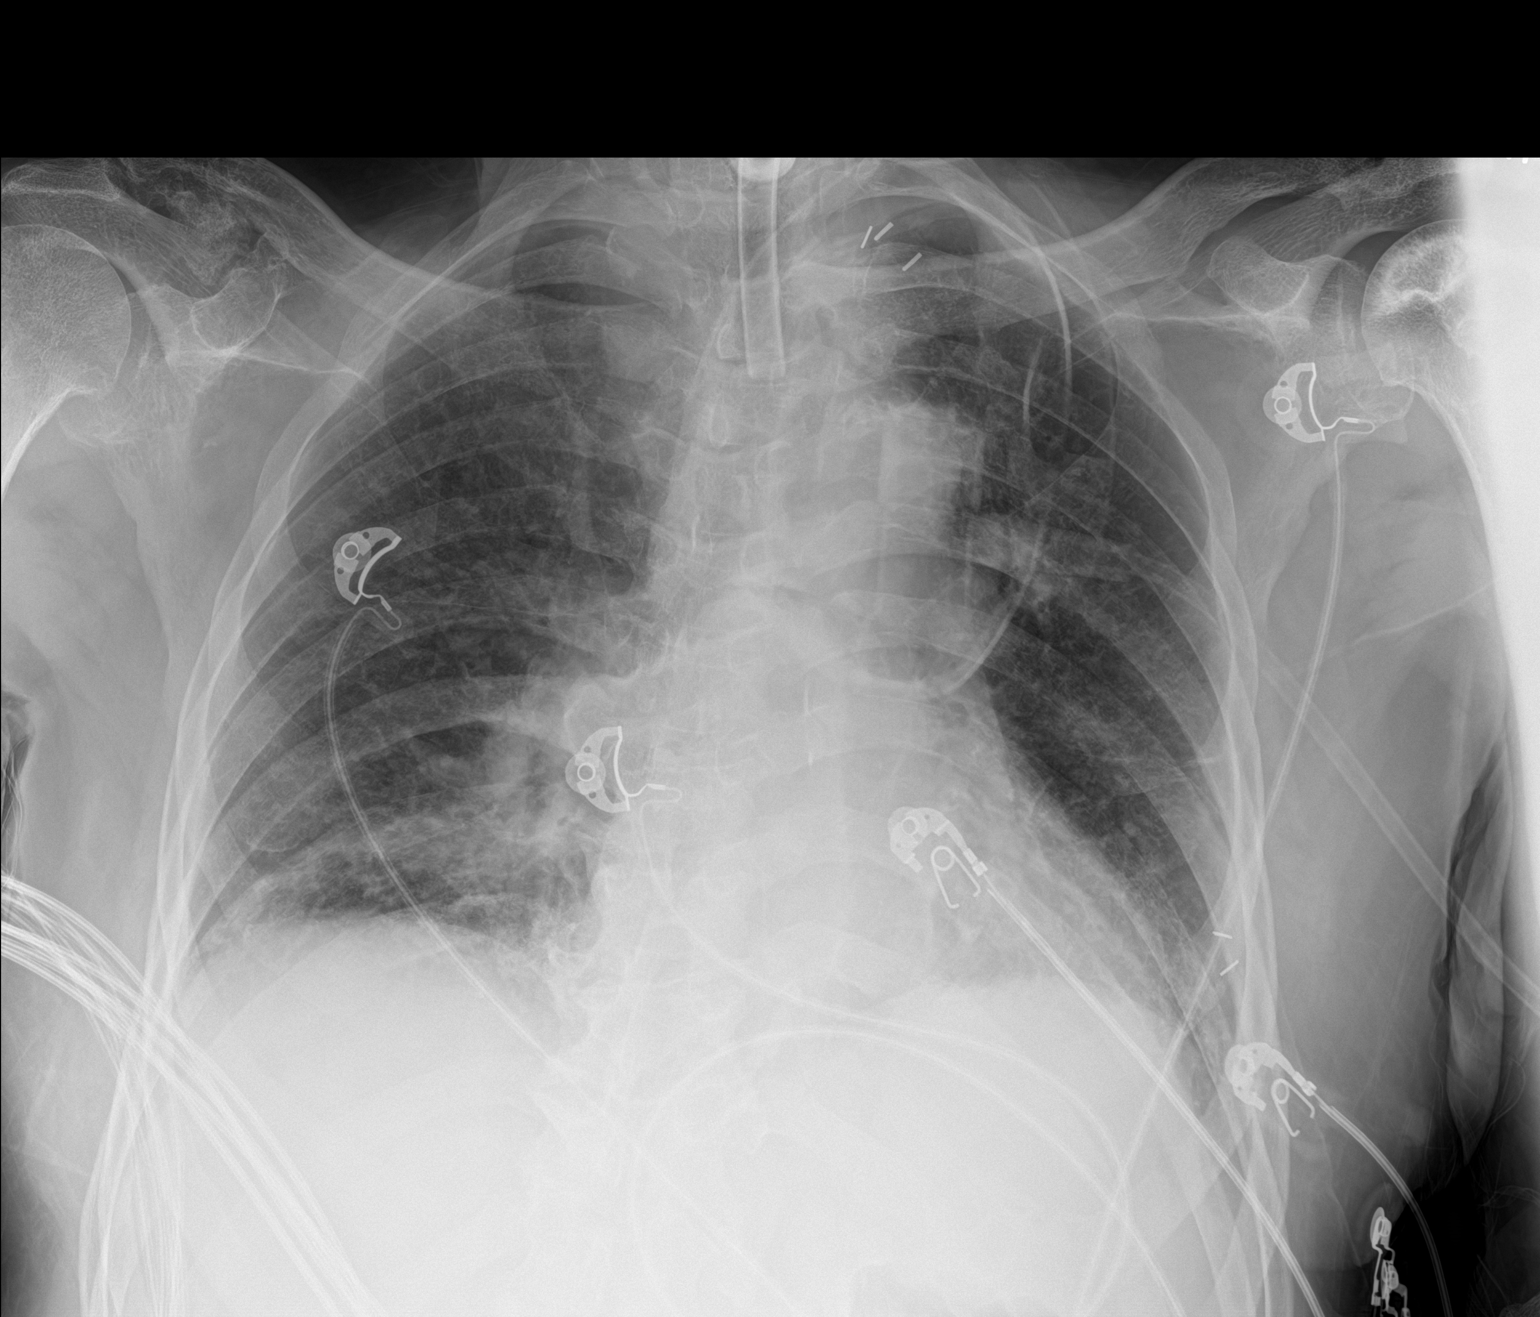

[1 of 1 positions shown; findings below may reference images not displayed]

FINDINGS: That the tracheostomy tube is in good position. No pneumothorax. The
left-sided infiltrate seen previously has resolved. Minimal
atelectasis in the left base. There is mild opacity in the medial
right lung base, slightly more prominent the interval. The
cardiomediastinal silhouette is stable. No other interval changes.
IMPRESSION: 1. Mild opacity in the medial right lung base is mildly more
prominent compared to the previous study. This could represent a
combination of vascular crowding and atelectasis. A developing
infiltrate is not excluded. Recommend clinical correlation and
follow-up to resolution.

## 2019-01-27 IMAGING — DX DG ABD PORTABLE 1V
1 series · 1 of 1 positions shown · non-contrast
Comparison: CT abdomen and pelvis December 18, 2017

CLINICAL DATA: Nasogastric tube placement.

EXAM:
PORTABLE ABDOMEN - 1 VIEW

[abdomen kub]
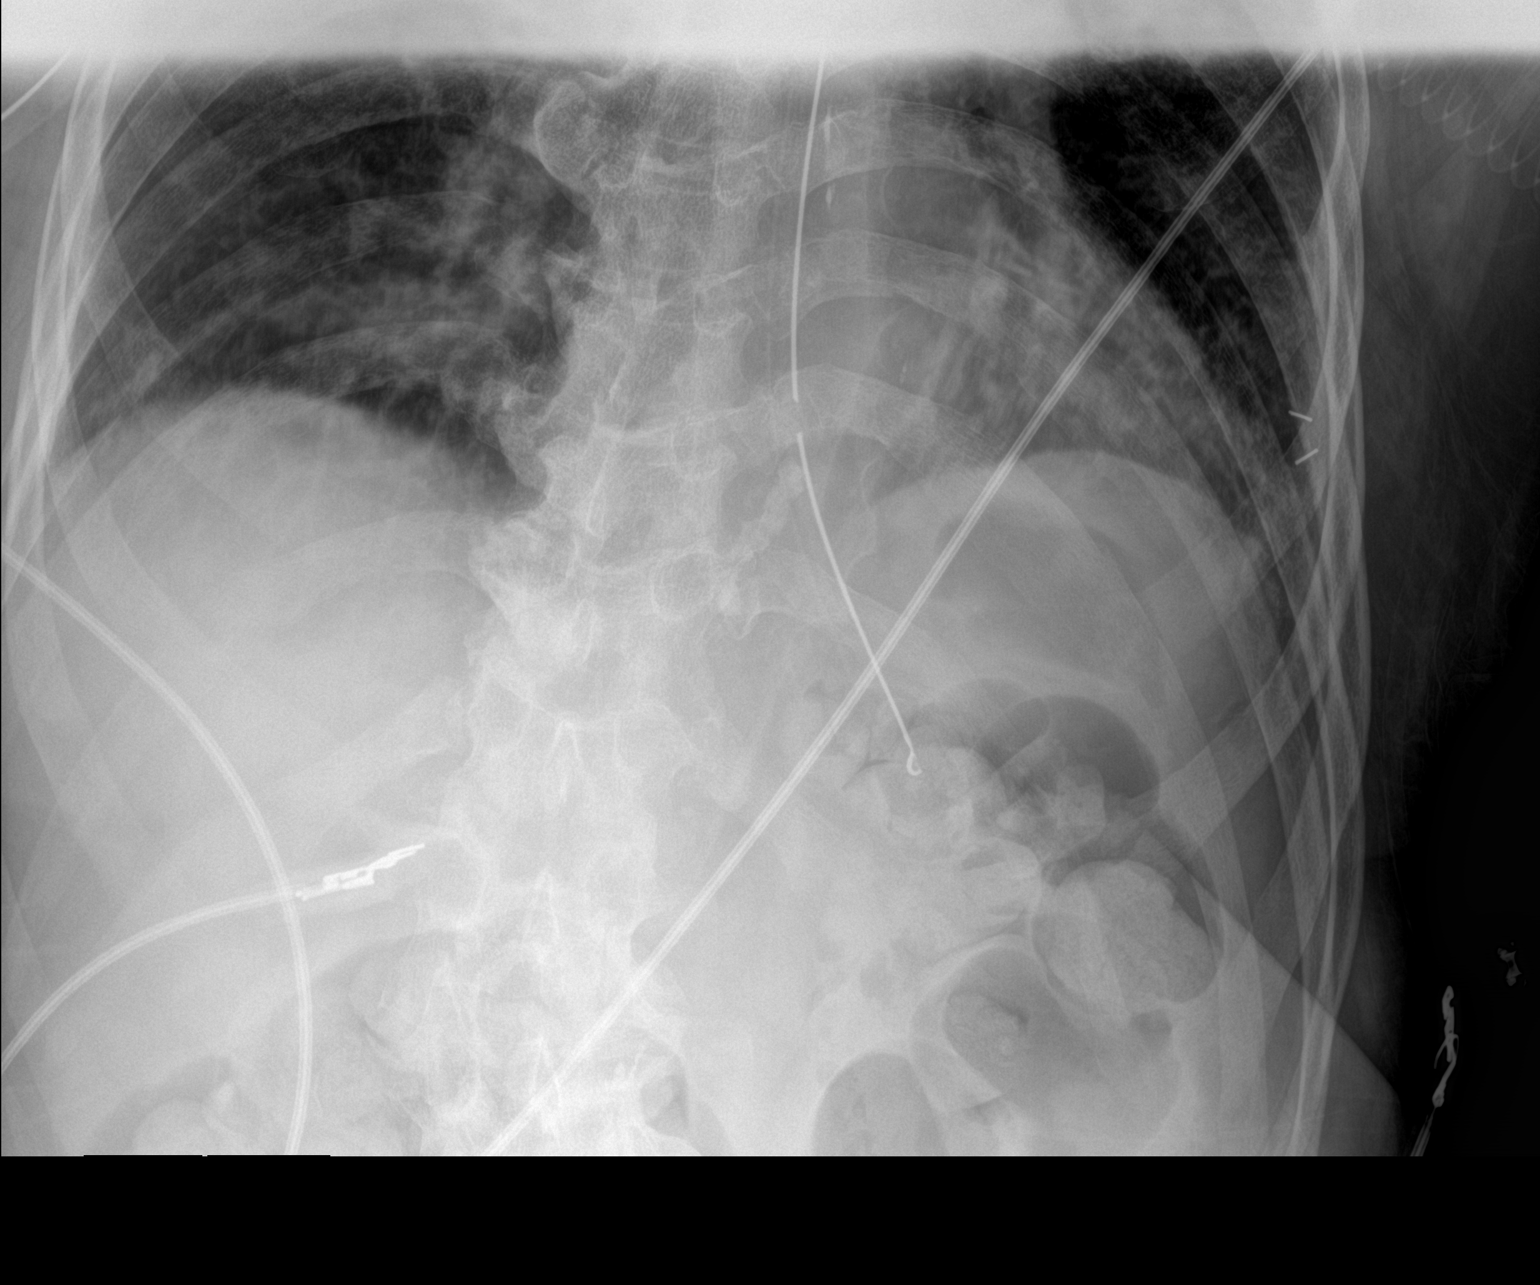

[1 of 1 positions shown; findings below may reference images not displayed]

FINDINGS: Nasogastric tube tip projects in proximal stomach, side port above
the GE junction region. Known gastrostomy tube difficult to
identify. Moderate amount of retained large bowel stool.
IMPRESSION: Nasogastric tube tip projects in proximal stomach, side port above
the GE junction region.

## 2019-01-27 IMAGING — DX DG CHEST 1V PORT
1 series · 1 of 1 positions shown · non-contrast
Comparison: Chest radiograph dated 12/17/2017

CLINICAL DATA: 67-year-old male with aspiration of gastric content.

EXAM:
PORTABLE CHEST 1 VIEW

[chest ap]
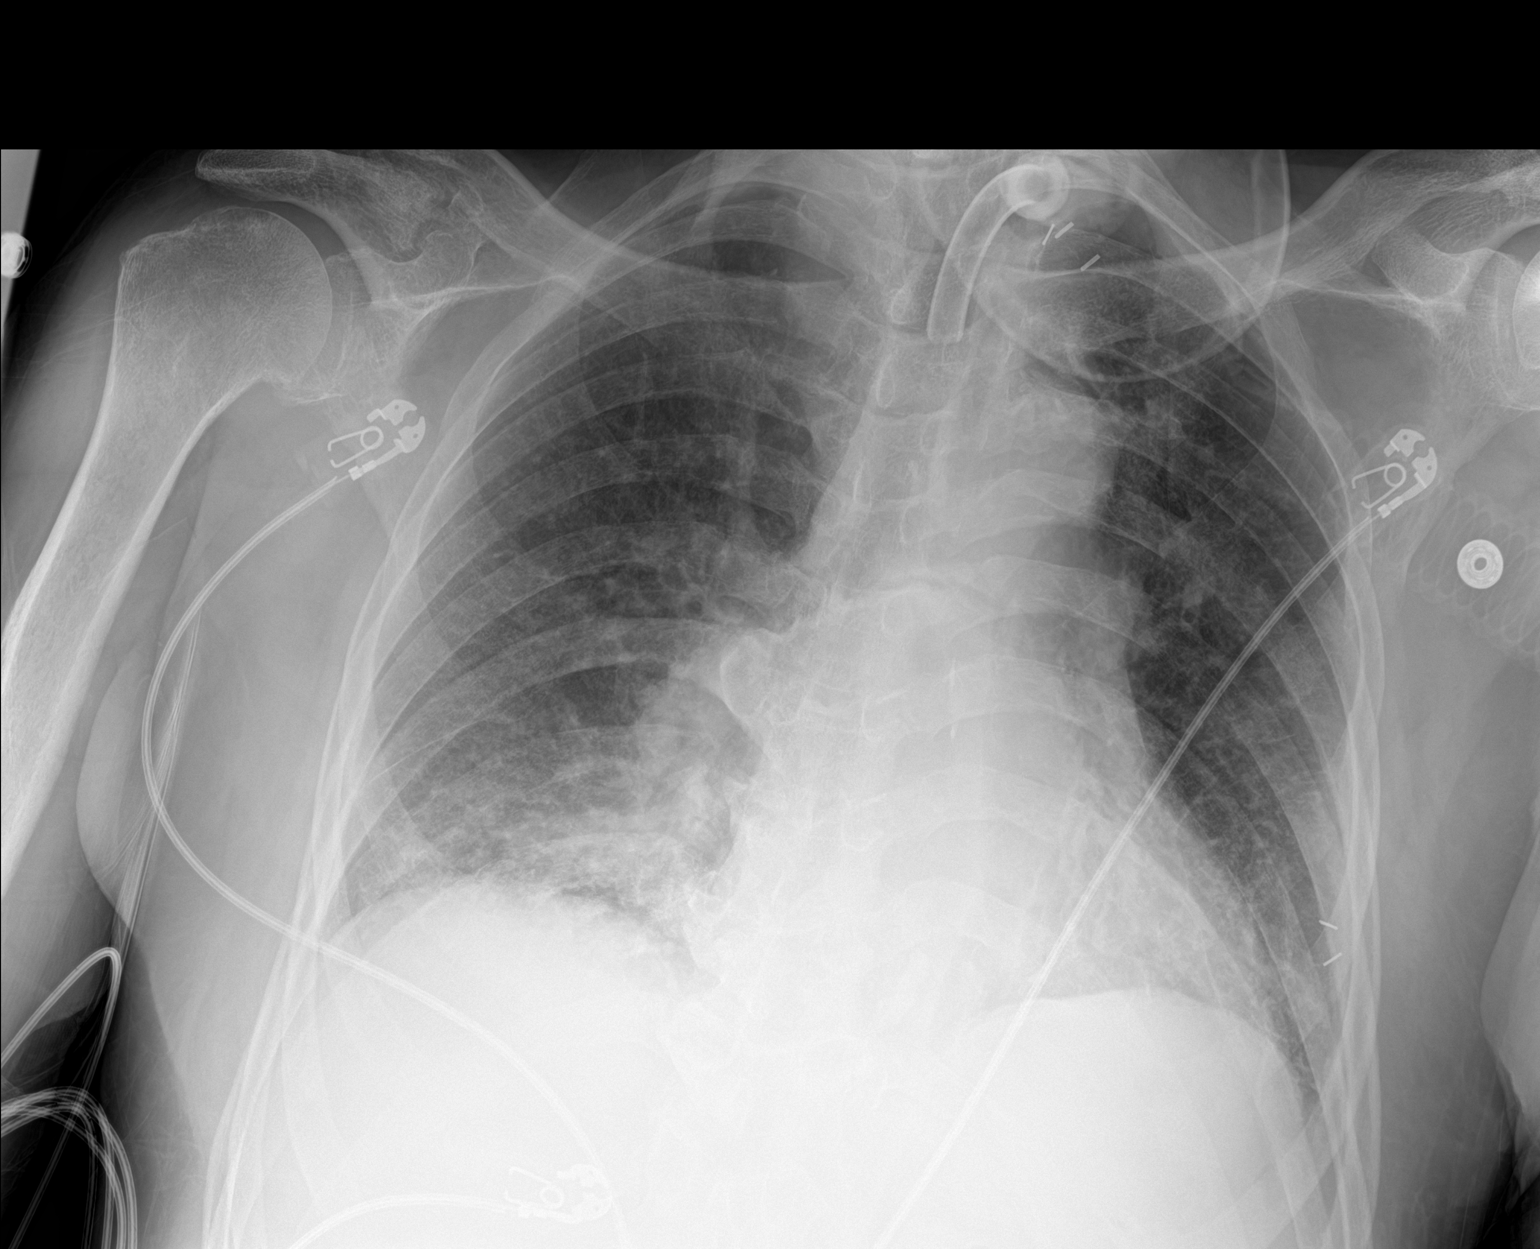

[1 of 1 positions shown; findings below may reference images not displayed]

FINDINGS: Tracheostomy above the carina in similar position. Interval
progression of the densities at the right lung base since the prior
radiograph. Although this may be partly related to atelectatic
changes and vascular crowding, interval progression is concerning
for developing infiltrate. Clinical correlation and follow-up
recommended. Minimal left lung base atelectatic changes. No lobar
consolidation. There is no pleural effusion or pneumothorax. The
cardiac silhouette is within normal limits. No acute osseous
pathology. Degenerative changes of the right shoulder and acromion.
IMPRESSION: Interval progression of right lung base/right infrahilar densities
since the prior radiograph. Clinical correlation follow-up
recommended.

## 2019-01-27 IMAGING — CT CT ABD-PELV W/ CM
2 of 5 series · 16 of 46 positions shown, 18 images · IV contrast (Omni 300)
Comparison: 06/20/2017 CT

CLINICAL DATA: Dark emesis x1 at home as well as on the CT table.
Nonverbal.

EXAM:
CT ABDOMEN AND PELVIS WITH CONTRAST
TECHNIQUE: Multidetector CT imaging of the abdomen and pelvis was performed
using the standard protocol following bolus administration of
intravenous contrast.
CONTRAST:  100mL [id number] IOPAMIDOL ([id number]) INJECTION 61%

[Series 3: a/p w/ 5mm · axial · 0.90mm/px · z∈[-528,-88]mm · 13 of 98 slices shown, 15 images]
[im 5/98  soft-tissue]
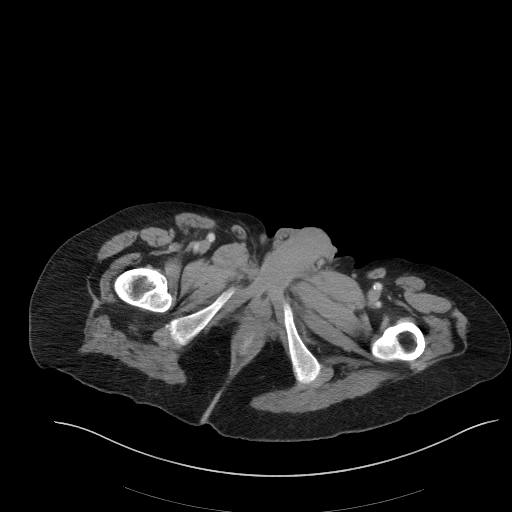
[im 5/98  bone]
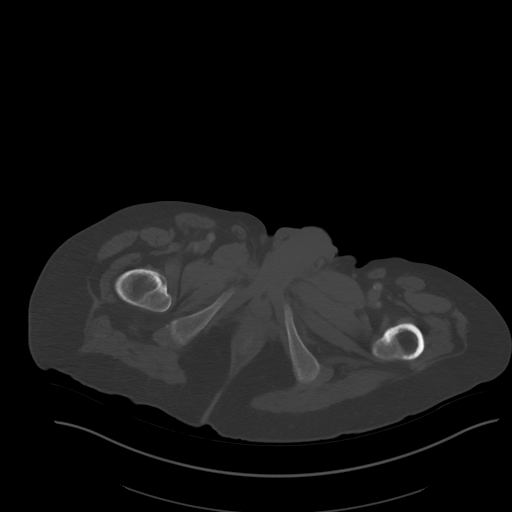
[im 15/98  soft-tissue]
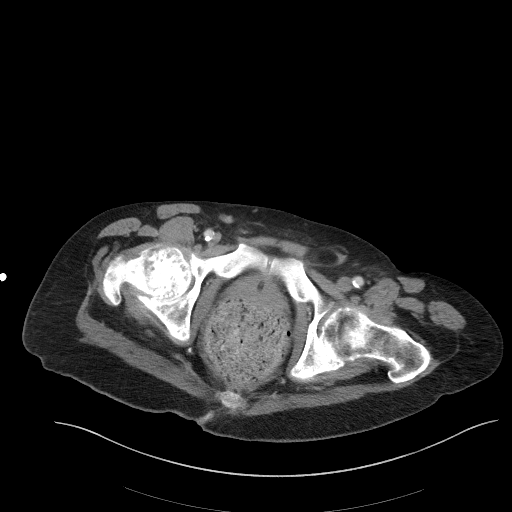
[im 20/98  soft-tissue]
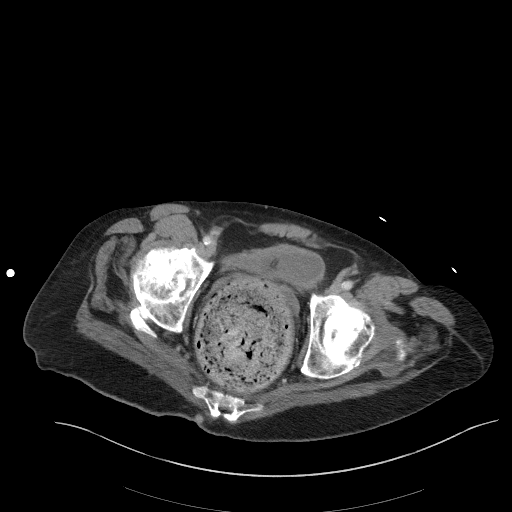
[im 30/98  soft-tissue]
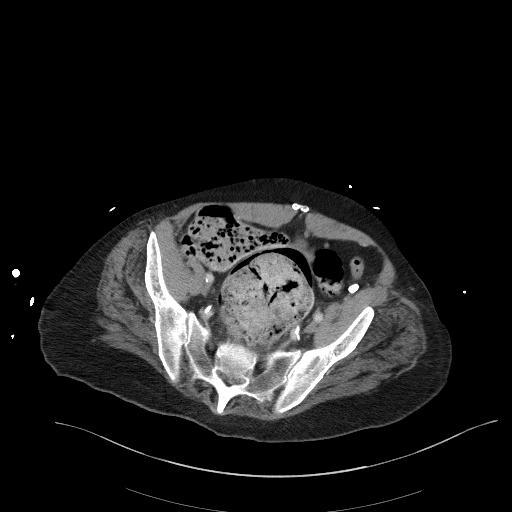
[im 34/98  soft-tissue]
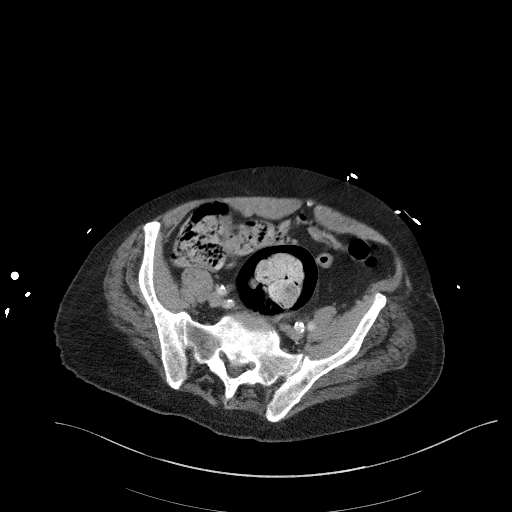
[im 44/98  soft-tissue]
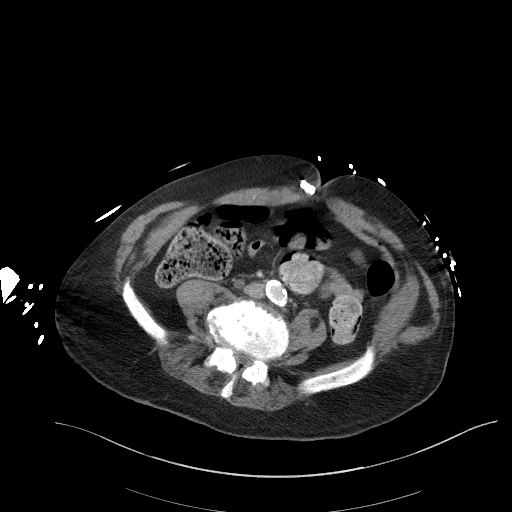
[im 49/98  soft-tissue]
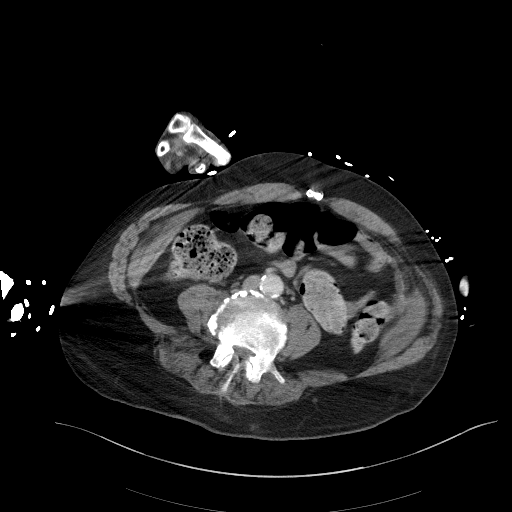
[im 54/98  soft-tissue]
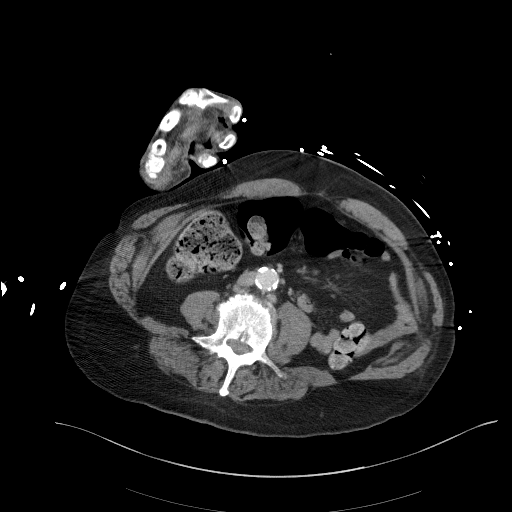
[im 64/98  soft-tissue]
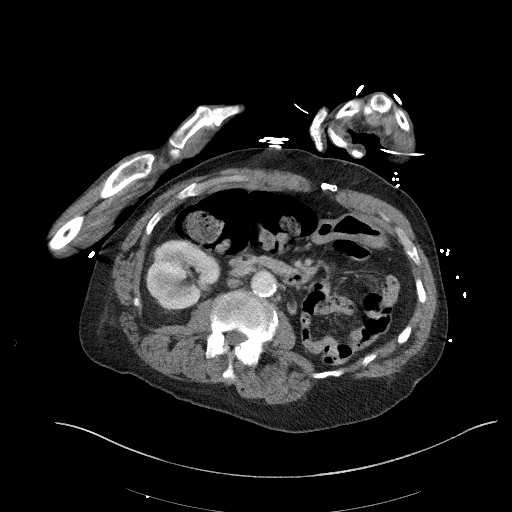
[im 64/98  bone]
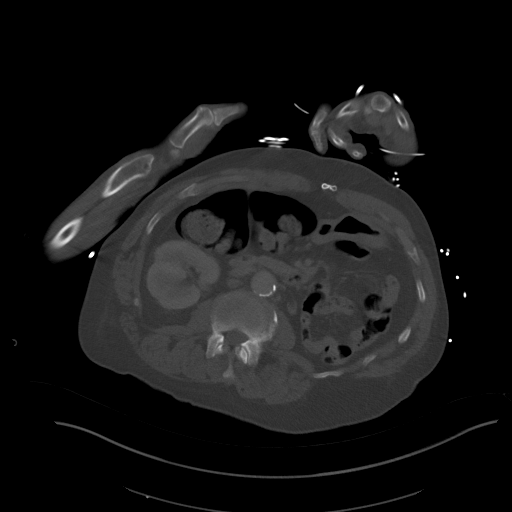
[im 68/98  soft-tissue]
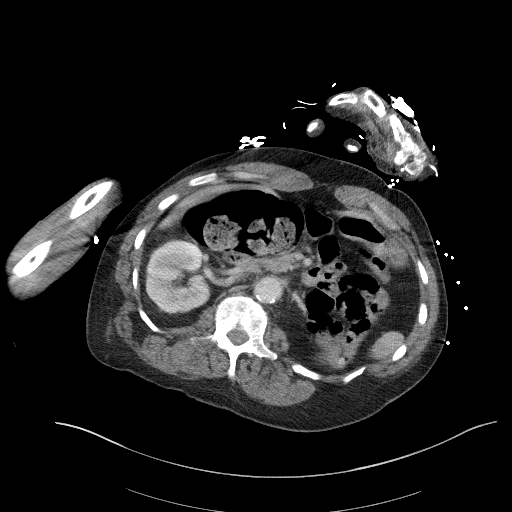
[im 78/98  soft-tissue]
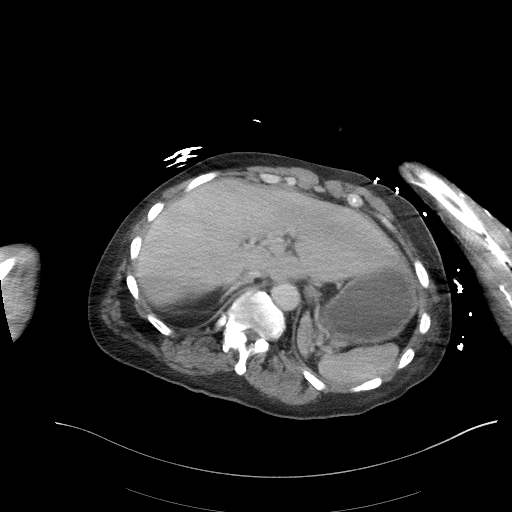
[im 83/98  soft-tissue]
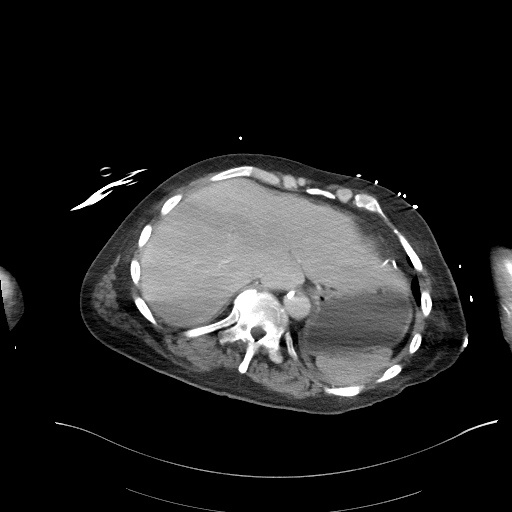
[im 93/98  soft-tissue]
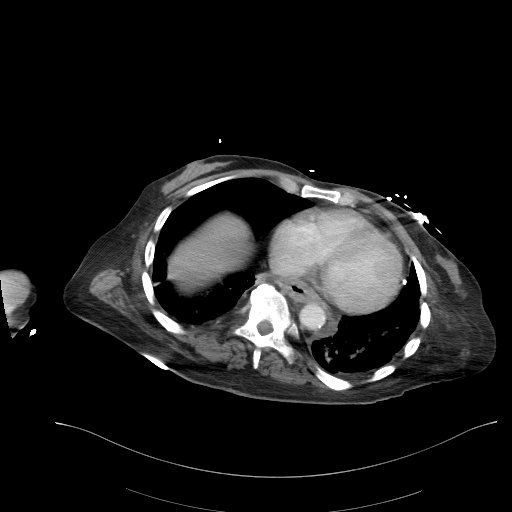

[Series 6: a/p w/ cor · coronal · 0.95mm/px · 3 of 151 slices shown]
[im 51/151  soft-tissue]
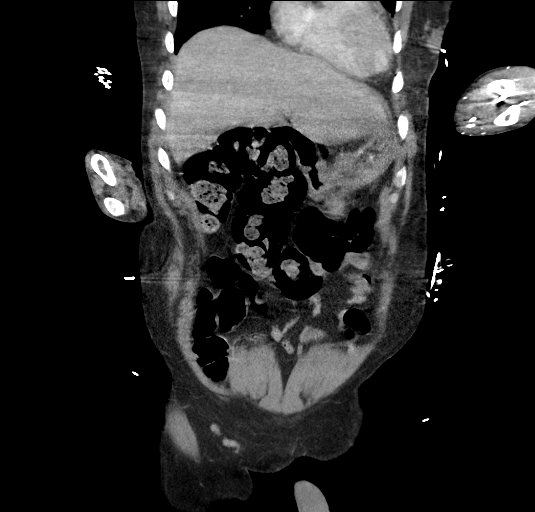
[im 67/151  soft-tissue]
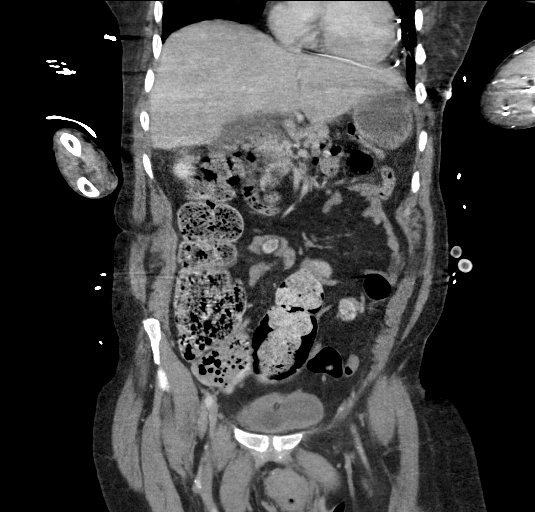
[im 84/151  soft-tissue]
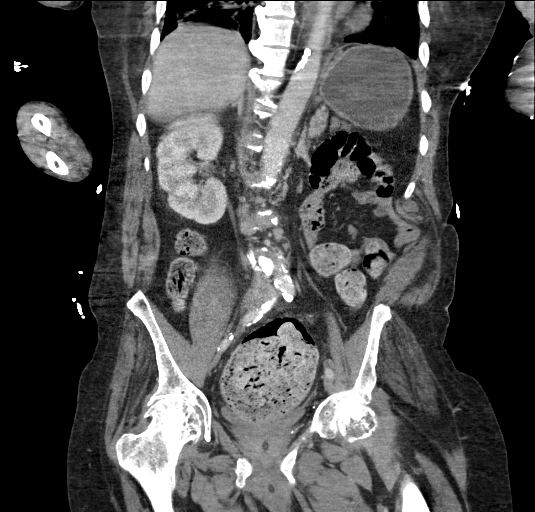

[16 of 46 positions shown; findings below may reference images not displayed]

FINDINGS: Lower chest: Top-normal size heart. Thick-walled distal esophagus
with small hiatal hernia question esophagitis. Bibasilar
peribronchial thickening with subsegmental atelectasis at each lung
base.

Hepatobiliary: Probable subcapsular cyst in the right hepatic lobe
measuring 1 cm. No biliary dilatation. Physiologic distention of the
gallbladder without stones.

Pancreas: No pancreatic mass or pathologic ductal dilatation. No
inflammation.

Spleen: Normal

Adrenals/Urinary Tract: Normal bilateral adrenal glands. Markedly
atrophic left kidney with compensatory hypertrophy of the right
kidney. No obstructive uropathy or mass. The urinary bladder is
unremarkable and decompressed by Foley catheter.

Stomach/Bowel: Significant stool retention with stool ball in the
rectum measuring 11 x 8.9 x 10.9 cm consistent with fecal impaction.
No small bowel dilatation or obstruction. No inflammation.
Percutaneous gastrostomy tube is in place.

Vascular/Lymphatic: Aortic atherosclerosis without aneurysm. No
adenopathy.

Reproductive: Prostate is unremarkable.

Other: No free air nor free fluid.

Musculoskeletal: Sagittal midline suture material. Mild dextroconvex
curvature of the mid lumbar spine with multilevel degenerative disc
and facet arthritis. Avascular necrosis of the femoral heads with
osteoarthritis. No collapse.
IMPRESSION: 1. Significant stool burden throughout the colon with fecal
impaction as above.
2. Thick-walled distal esophagus query esophagitis.
3. Gastrostomy tube within the distal stomach. Foley decompressed
urinary bladder.
4. Aortic atherosclerosis.
5. Compensatory hypertrophy of the right kidney with atrophic left
kidney as before.

## 2019-01-29 IMAGING — DX DG ABD PORTABLE 1V
1 series · 1 of 1 positions shown · non-contrast
Comparison: Portable exam 8382 hours compared to 12/18/2017

CLINICAL DATA: Constipation, history stage III chronic kidney
disease, hypertension

EXAM:
PORTABLE ABDOMEN - 1 VIEW

[abdomen kub]
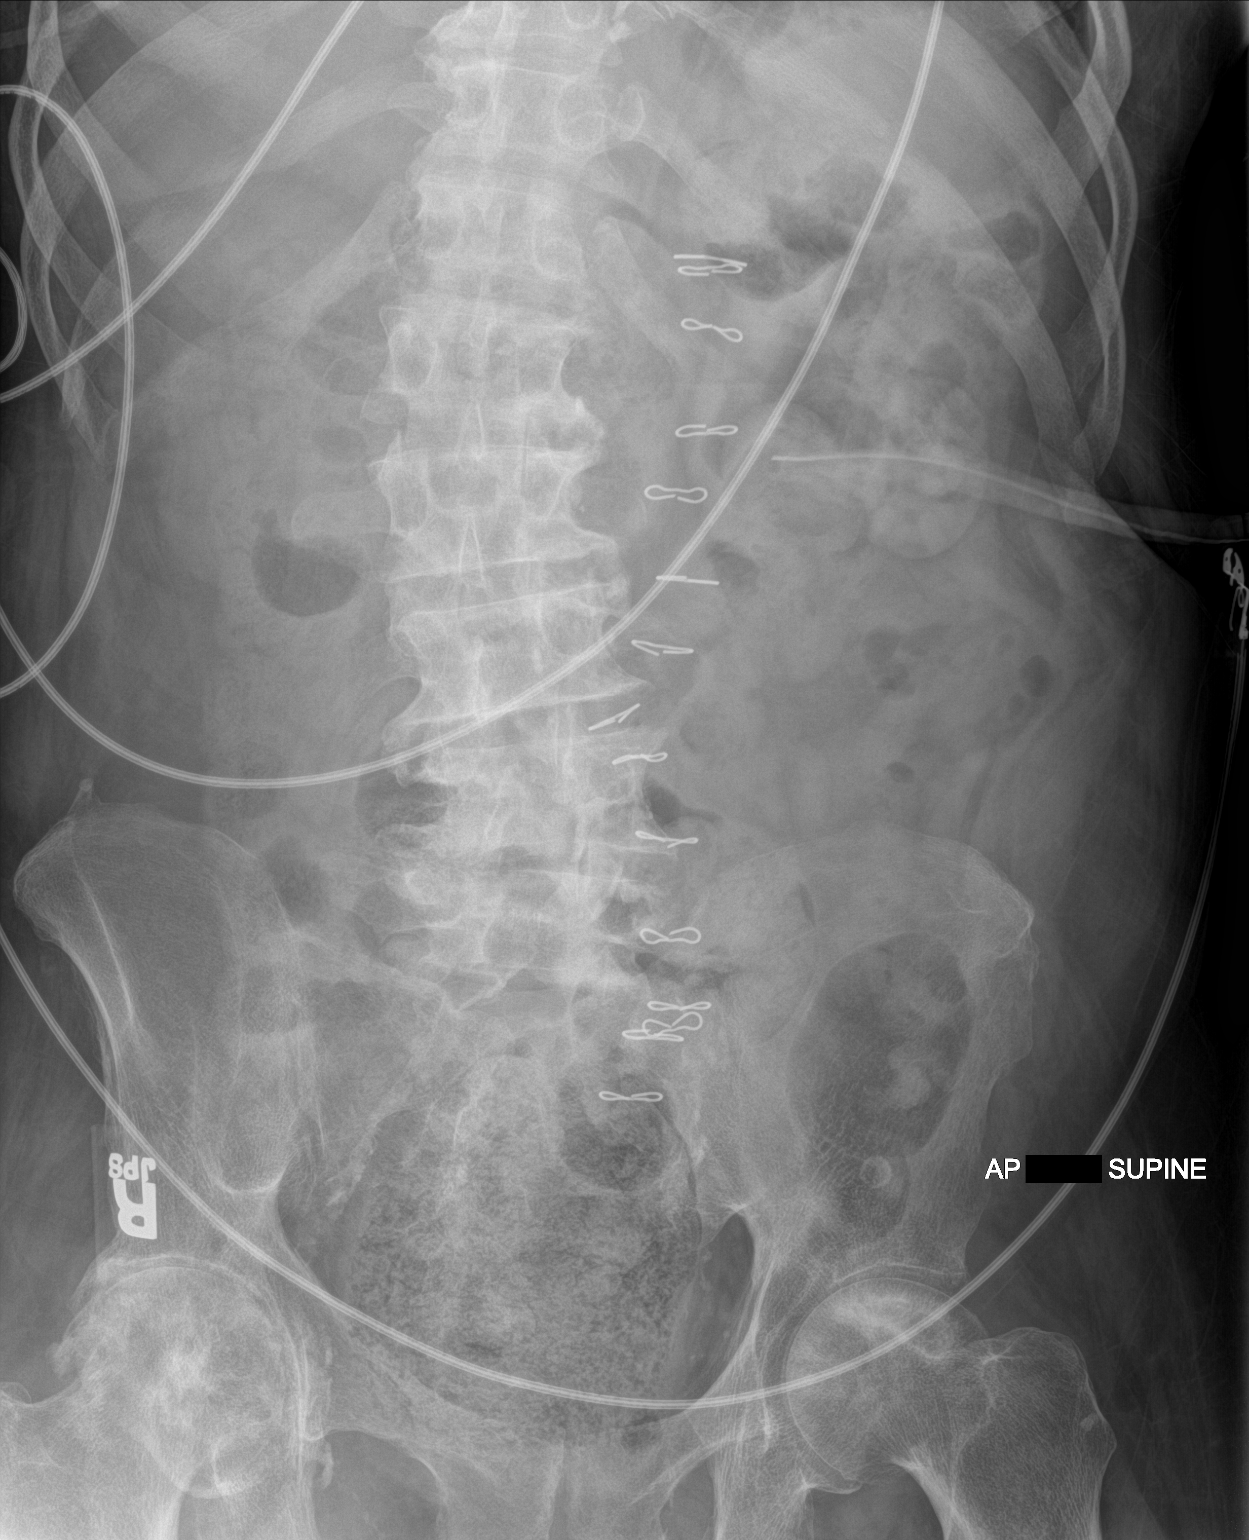

[1 of 1 positions shown; findings below may reference images not displayed]

FINDINGS: Significantly increased stool in rectum.

Remaining bowel gas pattern normal.

Question G-tube projecting over LEFT mid abdomen.

Degenerative disc disease changes and scoliosis of the thoracolumbar
spine.

Degenerative changes of both hips with question prior avascular
necrosis of the femoral heads.

No urine tract calcification.
IMPRESSION: Significantly increased stool in rectum.

Question avascular necrosis of the femoral heads with degenerative
changes of both hip joints greater on RIGHT.

## 2019-01-29 IMAGING — DX DG CHEST 1V PORT
1 series · 1 of 1 positions shown · non-contrast
Comparison: Portable exam 0607 hours compared to 12/18/2017

CLINICAL DATA: Shortness of breath

EXAM:
PORTABLE CHEST 1 VIEW

[chest ap]
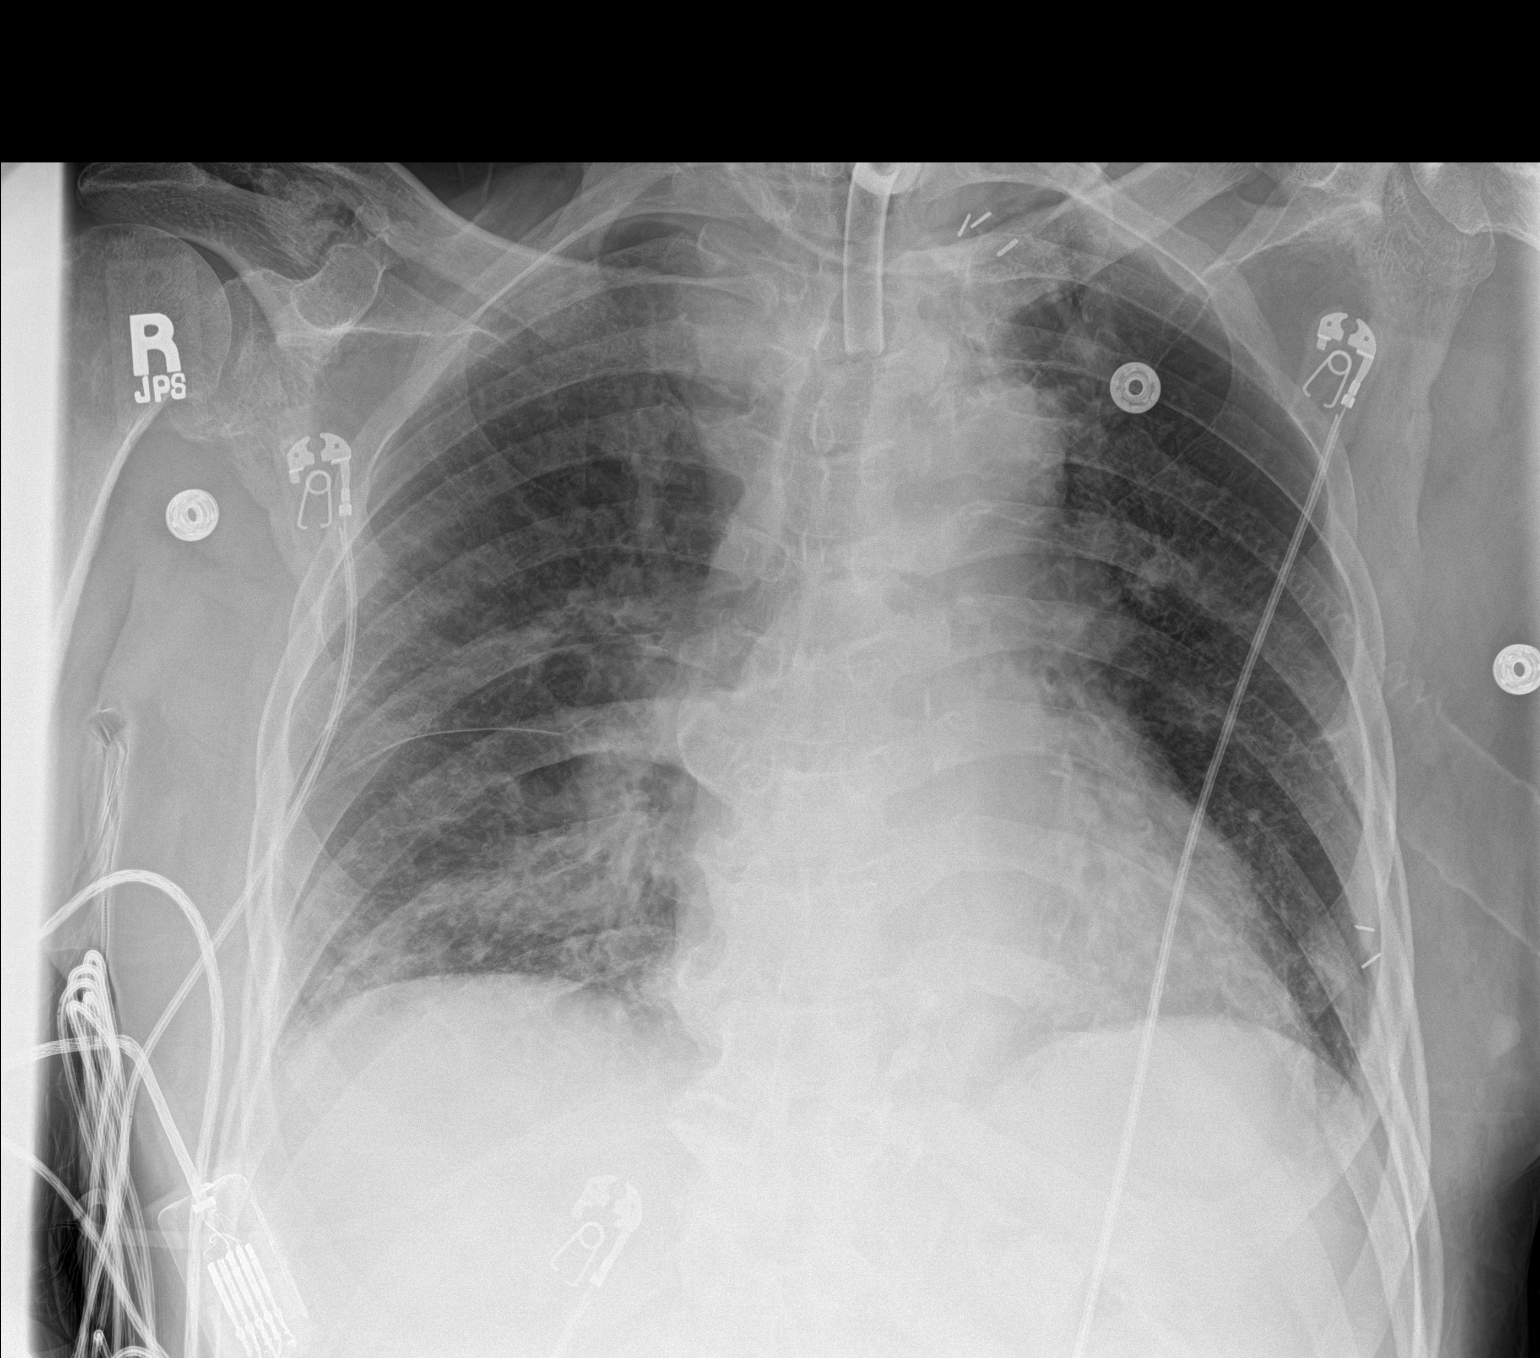

[1 of 1 positions shown; findings below may reference images not displayed]

FINDINGS: Tracheostomy tube stable.

Upper normal heart size.

Atherosclerotic calcification aorta.

Slightly prominent RIGHT paratracheal soft tissues unchanged.

Peribronchial thickening with increased BILATERAL pulmonary
infiltrates.

No pleural effusion or pneumothorax.

Question avascular necrosis of the LEFT humeral head.
IMPRESSION: Slightly increased pulmonary infiltrates favoring infection.

Suspected avascular necrosis of the LEFT humeral head.

## 2019-01-30 IMAGING — DX DG ABD PORTABLE 1V
1 series · 1 of 1 positions shown · non-contrast
Comparison: 12/20/2017

CLINICAL DATA: Constipation.

EXAM:
PORTABLE ABDOMEN - 1 VIEW

[abdomen]
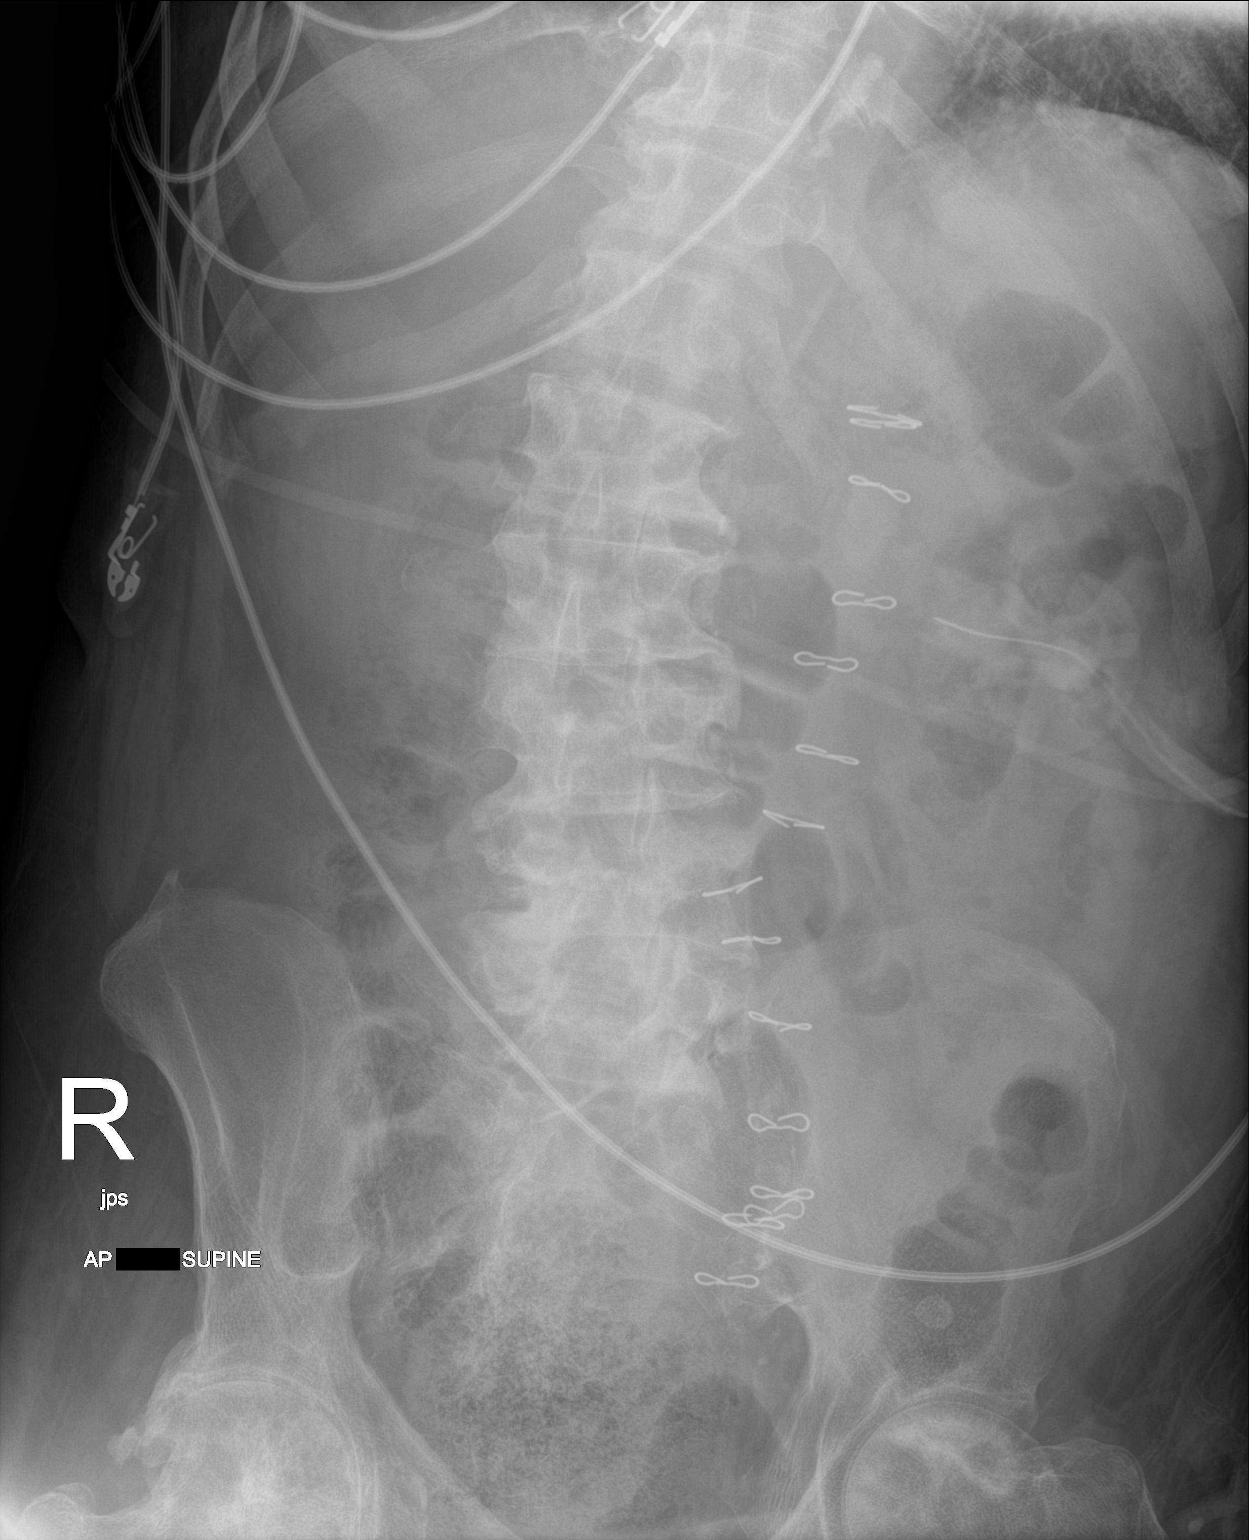

[1 of 1 positions shown; findings below may reference images not displayed]

FINDINGS: There is no bowel dilation to suggest obstruction or significant
adynamic ileus. Rectal and colonic stool burden appears mildly
improved from the previous exam.

Left mid abdomen gastrostomy tube is stable.
IMPRESSION: 1. Mild decrease in the amount of colonic and rectal stool since the
previous day's study.
2. No acute findings.  No bowel obstruction.

## 2019-02-04 ENCOUNTER — Emergency Department (HOSPITAL_COMMUNITY): Payer: Medicare HMO

## 2019-02-04 ENCOUNTER — Encounter (HOSPITAL_COMMUNITY): Payer: Self-pay

## 2019-02-04 ENCOUNTER — Inpatient Hospital Stay (HOSPITAL_COMMUNITY)
Admission: EM | Admit: 2019-02-04 | Discharge: 2019-02-08 | DRG: 683 | Disposition: A | Discharge status: Skilled Nursing Facility | Payer: Medicare HMO | Attending: Internal Medicine | Admitting: Internal Medicine

## 2019-02-04 DIAGNOSIS — K5900 Constipation, unspecified: Secondary | ICD-10-CM

## 2019-02-04 DIAGNOSIS — I69391 Dysphagia following cerebral infarction: Secondary | ICD-10-CM

## 2019-02-04 DIAGNOSIS — N182 Chronic kidney disease, stage 2 (mild): Secondary | ICD-10-CM | POA: Diagnosis not present

## 2019-02-04 DIAGNOSIS — D72829 Elevated white blood cell count, unspecified: Secondary | ICD-10-CM | POA: Diagnosis present

## 2019-02-04 DIAGNOSIS — N189 Chronic kidney disease, unspecified: Secondary | ICD-10-CM | POA: Diagnosis present

## 2019-02-04 DIAGNOSIS — Z20828 Contact with and (suspected) exposure to other viral communicable diseases: Secondary | ICD-10-CM | POA: Diagnosis present

## 2019-02-04 DIAGNOSIS — Z931 Gastrostomy status: Secondary | ICD-10-CM

## 2019-02-04 DIAGNOSIS — D631 Anemia in chronic kidney disease: Secondary | ICD-10-CM | POA: Diagnosis present

## 2019-02-04 DIAGNOSIS — R112 Nausea with vomiting, unspecified: Secondary | ICD-10-CM | POA: Diagnosis not present

## 2019-02-04 DIAGNOSIS — R1310 Dysphagia, unspecified: Secondary | ICD-10-CM | POA: Diagnosis present

## 2019-02-04 DIAGNOSIS — Z930 Tracheostomy status: Secondary | ICD-10-CM

## 2019-02-04 DIAGNOSIS — Z978 Presence of other specified devices: Secondary | ICD-10-CM

## 2019-02-04 DIAGNOSIS — N179 Acute kidney failure, unspecified: Principal | ICD-10-CM | POA: Diagnosis present

## 2019-02-04 DIAGNOSIS — E861 Hypovolemia: Secondary | ICD-10-CM | POA: Diagnosis present

## 2019-02-04 DIAGNOSIS — N261 Atrophy of kidney (terminal): Secondary | ICD-10-CM | POA: Diagnosis present

## 2019-02-04 DIAGNOSIS — E785 Hyperlipidemia, unspecified: Secondary | ICD-10-CM | POA: Diagnosis present

## 2019-02-04 DIAGNOSIS — R0689 Other abnormalities of breathing: Secondary | ICD-10-CM | POA: Diagnosis present

## 2019-02-04 DIAGNOSIS — Z8673 Personal history of transient ischemic attack (TIA), and cerebral infarction without residual deficits: Secondary | ICD-10-CM

## 2019-02-04 DIAGNOSIS — J9611 Chronic respiratory failure with hypoxia: Secondary | ICD-10-CM | POA: Diagnosis present

## 2019-02-04 DIAGNOSIS — Z87891 Personal history of nicotine dependence: Secondary | ICD-10-CM

## 2019-02-04 DIAGNOSIS — Z7902 Long term (current) use of antithrombotics/antiplatelets: Secondary | ICD-10-CM

## 2019-02-04 DIAGNOSIS — I10 Essential (primary) hypertension: Secondary | ICD-10-CM | POA: Diagnosis present

## 2019-02-04 DIAGNOSIS — N183 Chronic kidney disease, stage 3 unspecified: Secondary | ICD-10-CM | POA: Diagnosis present

## 2019-02-04 DIAGNOSIS — Z833 Family history of diabetes mellitus: Secondary | ICD-10-CM

## 2019-02-04 DIAGNOSIS — F329 Major depressive disorder, single episode, unspecified: Secondary | ICD-10-CM | POA: Diagnosis present

## 2019-02-04 DIAGNOSIS — Z7401 Bed confinement status: Secondary | ICD-10-CM

## 2019-02-04 LAB — CBC WITH DIFFERENTIAL/PLATELET
Abs Immature Granulocytes: 0.1 10*3/uL — ABNORMAL HIGH (ref 0.00–0.07)
Basophils Absolute: 0.1 10*3/uL (ref 0.0–0.1)
Basophils Relative: 1 %
Eosinophils Absolute: 0.4 10*3/uL (ref 0.0–0.5)
Eosinophils Relative: 2 %
HCT: 49.7 % (ref 39.0–52.0)
Hemoglobin: 14.7 g/dL (ref 13.0–17.0)
Immature Granulocytes: 1 %
Lymphocytes Relative: 14 %
Lymphs Abs: 2.4 10*3/uL (ref 0.7–4.0)
MCH: 28.9 pg (ref 26.0–34.0)
MCHC: 29.6 g/dL — ABNORMAL LOW (ref 30.0–36.0)
MCV: 97.6 fL (ref 80.0–100.0)
Monocytes Absolute: 2.1 10*3/uL — ABNORMAL HIGH (ref 0.1–1.0)
Monocytes Relative: 12 %
Neutro Abs: 11.8 10*3/uL — ABNORMAL HIGH (ref 1.7–7.7)
Neutrophils Relative %: 70 %
Platelets: 463 10*3/uL — ABNORMAL HIGH (ref 150–400)
RBC: 5.09 MIL/uL (ref 4.22–5.81)
RDW: 13.7 % (ref 11.5–15.5)
WBC: 16.9 10*3/uL — ABNORMAL HIGH (ref 4.0–10.5)
nRBC: 0 % (ref 0.0–0.2)

## 2019-02-04 LAB — COMPREHENSIVE METABOLIC PANEL
ALT: 11 U/L (ref 0–44)
AST: 28 U/L (ref 15–41)
Albumin: 3.8 g/dL (ref 3.5–5.0)
Alkaline Phosphatase: 93 U/L (ref 38–126)
Anion gap: 18 — ABNORMAL HIGH (ref 5–15)
BUN: 53 mg/dL — ABNORMAL HIGH (ref 8–23)
CO2: 28 mmol/L (ref 22–32)
Calcium: 10.3 mg/dL (ref 8.9–10.3)
Chloride: 96 mmol/L — ABNORMAL LOW (ref 98–111)
Creatinine, Ser: 3.08 mg/dL — ABNORMAL HIGH (ref 0.61–1.24)
GFR calc Af Amer: 23 mL/min — ABNORMAL LOW (ref >60)
GFR calc non Af Amer: 20 mL/min — ABNORMAL LOW (ref >60)
Glucose, Bld: 92 mg/dL (ref 70–99)
Potassium: 4.9 mmol/L (ref 3.5–5.1)
Sodium: 142 mmol/L (ref 135–145)
Total Bilirubin: 0.6 mg/dL (ref 0.3–1.2)
Total Protein: 9.1 g/dL — ABNORMAL HIGH (ref 6.5–8.1)

## 2019-02-04 LAB — URINALYSIS, ROUTINE W REFLEX MICROSCOPIC
Bilirubin Urine: NEGATIVE
Glucose, UA: NEGATIVE mg/dL
Ketones, ur: NEGATIVE mg/dL
Nitrite: NEGATIVE
Protein, ur: 100 mg/dL — AB
Specific Gravity, Urine: 1.017 (ref 1.005–1.030)
pH: 5 (ref 5.0–8.0)

## 2019-02-04 LAB — SARS CORONAVIRUS 2 (TAT 6-24 HRS): SARS Coronavirus 2: NEGATIVE

## 2019-02-04 MED ORDER — JEVITY 1.2 CAL PO LIQD
237.0000 mL | ORAL | Status: DC
  Administered 2019-02-04 – 2019-02-05 (×2): 237 mL
  Administered 2019-02-06: 35 mL/h
  Filled 2019-02-04 (×3): qty 237

## 2019-02-04 MED ORDER — ACETAMINOPHEN 325 MG PO TABS: 650.0000 mg | ORAL_TABLET | Freq: Four times a day (QID) | ORAL | Status: DC | PRN

## 2019-02-04 MED ORDER — MONTELUKAST SODIUM 10 MG PO TABS
10.0000 mg | ORAL_TABLET | Freq: Every day | ORAL | Status: DC
  Administered 2019-02-04 – 2019-02-08 (×5): 10 mg
  Filled 2019-02-04 (×5): qty 1

## 2019-02-04 MED ORDER — FLUCONAZOLE 100 MG PO TABS
200.0000 mg | ORAL_TABLET | Freq: Every day | ORAL | Status: DC
  Administered 2019-02-05 – 2019-02-08 (×4): 200 mg via ORAL
  Filled 2019-02-04: qty 1
  Filled 2019-02-04 (×3): qty 2

## 2019-02-04 MED ORDER — GABAPENTIN 100 MG PO CAPS
100.0000 mg | ORAL_CAPSULE | Freq: Three times a day (TID) | ORAL | Status: DC
  Administered 2019-02-04 – 2019-02-08 (×10): 100 mg via ORAL
  Filled 2019-02-04 (×11): qty 1

## 2019-02-04 MED ORDER — PRO-STAT SUGAR FREE PO LIQD
30.0000 mL | Freq: Two times a day (BID) | ORAL | Status: DC
  Administered 2019-02-04 – 2019-02-06 (×4): 30 mL
  Filled 2019-02-04 (×5): qty 30

## 2019-02-04 MED ORDER — PANTOPRAZOLE SODIUM 40 MG PO PACK
40.0000 mg | PACK | Freq: Two times a day (BID) | ORAL | Status: DC
  Administered 2019-02-04 – 2019-02-08 (×8): 40 mg
  Filled 2019-02-04 (×9): qty 20

## 2019-02-04 MED ORDER — AMLODIPINE BESYLATE 10 MG PO TABS
10.0000 mg | ORAL_TABLET | Freq: Every day | ORAL | Status: DC
  Administered 2019-02-05 – 2019-02-08 (×4): 10 mg
  Filled 2019-02-04 (×3): qty 1
  Filled 2019-02-04: qty 2

## 2019-02-04 MED ORDER — GLYCOPYRROLATE 1 MG PO TABS
1.0000 mg | ORAL_TABLET | Freq: Two times a day (BID) | ORAL | Status: DC
  Administered 2019-02-04 – 2019-02-08 (×8): 1 mg
  Filled 2019-02-04 (×9): qty 1

## 2019-02-04 MED ORDER — SODIUM CHLORIDE 0.9 % IV SOLN
INTRAVENOUS | Status: AC
  Administered 2019-02-04: 1000 mL via INTRAVENOUS

## 2019-02-04 MED ORDER — CLOPIDOGREL BISULFATE 75 MG PO TABS
75.0000 mg | ORAL_TABLET | Freq: Every day | ORAL | Status: DC
  Administered 2019-02-05 – 2019-02-08 (×4): 75 mg
  Filled 2019-02-04 (×4): qty 1

## 2019-02-04 MED ORDER — METOPROLOL TARTRATE 50 MG PO TABS
50.0000 mg | ORAL_TABLET | Freq: Two times a day (BID) | ORAL | Status: DC
  Administered 2019-02-04 – 2019-02-08 (×8): 50 mg via ORAL
  Filled 2019-02-04: qty 2
  Filled 2019-02-04: qty 1
  Filled 2019-02-04 (×2): qty 2
  Filled 2019-02-04 (×5): qty 1

## 2019-02-04 MED ORDER — FERROUS SULFATE 300 (60 FE) MG/5ML PO SYRP
300.0000 mg | ORAL_SOLUTION | Freq: Every day | ORAL | Status: DC
  Administered 2019-02-05 – 2019-02-08 (×4): 300 mg
  Filled 2019-02-04 (×4): qty 5

## 2019-02-04 MED ORDER — HEPARIN SODIUM (PORCINE) 5000 UNIT/ML IJ SOLN
5000.0000 [IU] | Freq: Three times a day (TID) | INTRAMUSCULAR | Status: DC
  Administered 2019-02-04 – 2019-02-08 (×10): 5000 [IU] via SUBCUTANEOUS
  Filled 2019-02-04 (×10): qty 1

## 2019-02-04 MED ORDER — PRAVASTATIN SODIUM 10 MG PO TABS
20.0000 mg | ORAL_TABLET | Freq: Every day | ORAL | Status: DC
  Administered 2019-02-05 – 2019-02-07 (×3): 20 mg
  Filled 2019-02-04 (×3): qty 2

## 2019-02-04 MED ORDER — SODIUM CHLORIDE 0.9 % IV BOLUS
1000.0000 mL | Freq: Once | INTRAVENOUS | Status: AC
  Administered 2019-02-04: 1000 mL via INTRAVENOUS

## 2019-02-04 MED ORDER — MIRTAZAPINE 15 MG PO TABS
7.5000 mg | ORAL_TABLET | Freq: Every day | ORAL | Status: DC
  Administered 2019-02-04 – 2019-02-07 (×4): 7.5 mg via ORAL
  Filled 2019-02-04 (×5): qty 1

## 2019-02-04 MED ORDER — ONDANSETRON HCL 4 MG/2ML IJ SOLN: 4.0000 mg | Freq: Four times a day (QID) | INTRAMUSCULAR | Status: DC | PRN

## 2019-02-04 MED ORDER — ACETAMINOPHEN 650 MG RE SUPP: 650.0000 mg | Freq: Four times a day (QID) | RECTAL | Status: DC | PRN

## 2019-02-04 MED ORDER — ACETAMINOPHEN 500 MG PO TABS
1000.0000 mg | ORAL_TABLET | Freq: Three times a day (TID) | ORAL | Status: DC
  Administered 2019-02-04 – 2019-02-08 (×10): 1000 mg
  Filled 2019-02-04 (×11): qty 2

## 2019-02-04 MED ORDER — ONDANSETRON HCL 4 MG PO TABS: 4.0000 mg | ORAL_TABLET | Freq: Four times a day (QID) | ORAL | Status: DC | PRN

## 2019-02-04 MED ORDER — JEVITY 1.2 CAL PO LIQD
237.0000 mL | ORAL | Status: DC
  Filled 2019-02-04: qty 237

## 2019-02-04 NOTE — ED Notes (Signed)
Bladder Scan - 75 mL

## 2019-02-04 NOTE — ED Notes (Signed)
Admitting MD ( Dr. Posey Pronto ) at bedside evaluating patient.Tracheostomy collar intact with O2 10 lpm 40% humidified , IV team consult ordered , suctioned multiple times - frothy secretions from tracheostomy. Renal ultrasound in progress.

## 2019-02-04 NOTE — ED Notes (Signed)
IV team at bedside 

## 2019-02-04 NOTE — ED Triage Notes (Signed)
Pt BIB [facility]. Pt had N/V x 2 days. Pt lives at home with sister (primary care giver). Normally wears 2L at home but upon arrival pt was 92%. Hx of stroke  EMS VS:  BP 100/60 HR: 120 18 RR O2: 2L 92%

## 2019-02-04 NOTE — H&P (Signed)
History and Physical    BERMAN GURSKI [id number] DOB: [birth year] DOA: 02/04/2019  PCP: [facility] [place]  Patient coming from: Home  I have personally briefly reviewed patient's old medical records in [place]  Chief Complaint: Nausea and vomiting  HPI: Scott Rivera is a 68 y.o. male with medical history significant for history of ischemic stroke with chronic respiratory insufficiency who is tracheostomy dependent, dysphagia and gastrostomy tube dependent, nonverbal, CKD stage II, hypertension, hyperlipidemia, depression, and requiring chronic indwelling Foley catheter presents to the ED for evaluation of nausea and vomiting.  Patient is nonverbal therefore entirety of history is obtained from sister by phone, EDP, chart reviewed.  Patient lives with his sister who is also his caretaker.  She states that at home he has been having nausea and vomiting ongoing over the last month which she feels is related to his tube feeds.  She says that when he has been in the hospital in the past he has not had any issues with this but as soon as he returns home he has vomiting with supplements via tube feed.  She attempted to hold feeds and administered only 100 cc water every 3 hours through the PEG tube over the last day and a half.  Patient is nonverbal at baseline and essentially bedbound but family does get him up in the chair at home.  ED Course:  Initial vitals showed BP 134/79, pulse 100, RR 15, temp 99.1 Fahrenheit, SPO2 93% on 10 L 40% FiO2 via trach collar.  Labs notable for BUN 53, creatinine 3.08, sodium 142, potassium 4.9, bicarb 28, serum glucose 92, WBC 16.9, hemoglobin 14.7, platelets 463,000.  SARS-CoV-2 test was obtained and pending.  Portable chest x-ray showed tracheostomy tube in place and no focal consolidation, edema, or effusion.  Renal ultrasound was ordered and pending.  Patient was given 1 L normal saline and feeding supplement via G-tube which was  reportedly tolerated well.  The hospitalist service was consulted to admit for further evaluation and management of AKI.  Review of Systems:  Unable to obtain full review of systems due to patient's nonverbal status.   Past Medical History:  Diagnosis Date  . Acute pulmonary edema (HCC)   . Anemia due to chronic kidney disease   . ARF (acute renal failure) ([place]) 10/19/2016  . Aspiration pneumonia ([place])   . Cerebellar infarct ([place]) 10/19/2016  . Chronic kidney disease, stage 3 (moderate)   . CKD (chronic kidney disease) stage 2, GFR 60-89 ml/min 01/02/2017  . 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 Gastrostomy tube obstruction ([place])   . HCAP (healthcare-associated pneumonia)   . Helicobacter pylori infection 09/21/2012  . History of adenomatous polyp of colon 07/28/2015  . Hypertension   . MDR Acinetobacter baumannii infection   . Pneumothorax, closed, traumatic, initial encounter 12/10/2016  . Renal insufficiency   . Rhabdomyolysis 10/19/2016  . S/P percutaneous endoscopic gastrostomy (PEG) tube placement ([place]) 03/30/2017  . Stage III pressure ulcer of sacral region ([place]) 12/17/2016  . Stroke ([place])   . UTI (urinary tract infection) 03/30/2017    Past Surgical History:  Procedure Laterality Date  . BIOPSY  01/01/2019   Procedure: BIOPSY;  Surgeon: Irving Copas., MD;  Location: [facility];  Service: Gastroenterology;;  . ESOPHAGOGASTRODUODENOSCOPY (EGD) WITH PROPOFOL N/A 01/01/2019   Procedure: ESOPHAGOGASTRODUODENOSCOPY (EGD) WITH PROPOFOL;  Surgeon: Irving Copas., MD;  Location: [facility];  Service: Gastroenterology;  Laterality: N/A;  . IR GASTROSTOMY TUBE  MOD SED  10/27/2016  . IR PATIENT EVAL TECH 0-60 MINS  01/05/2017  . IR PATIENT EVAL TECH 0-60 MINS  01/09/2017  . IR PATIENT EVAL TECH 0-60 MINS  03/03/2017  . IR PATIENT EVAL TECH 0-60 MINS  04/03/2017  . IR REPLACE G-TUBE SIMPLE WO FLUORO  11/20/2017  . IR [place] TUBE  PERCUT W/FLUORO  07/23/2017  . KNEE SURGERY    . TRACHEOSTOMY TUBE PLACEMENT      Social History:  reports that he has quit smoking. He has never used smokeless tobacco. He reports previous alcohol use. He reports previous drug use.  No Known Allergies  Family History  Problem Relation Age of Onset  . Diabetes Mellitus II Brother   . CAD Neg Hx   . Stroke Neg Hx      Prior to Admission medications   Medication Sig Start Date End Date Taking? Authorizing Provider  acetaminophen (TYLENOL) 500 MG tablet Place 1,000 mg into feeding tube 3 (three) times daily.     [provider]  Amino Acids-Protein Hydrolys (FEEDING SUPPLEMENT, PRO-STAT SUGAR FREE 64,) LIQD Place 30 mLs into feeding tube 2 (two) times daily. 01/15/19   Georgette Shell, MD  amLODipine (NORVASC) 10 MG tablet Place 1 tablet (10 mg total) into feeding tube daily. 11/23/17   Kayleen Memos, DO  cloNIDine (CATAPRES - DOSED IN MG/24 HR) 0.1 mg/24hr patch Place 1 patch (0.1 mg total) onto the skin every Tuesday. 01/21/19   Georgette Shell, MD  clopidogrel (PLAVIX) 75 MG tablet Place 1 tablet (75 mg total) into feeding tube daily. 01/04/19   Mariel Aloe, MD  Ferrous Sulfate (IRON) 325 (65 Fe) MG TABS Place 325 mg into feeding tube daily.     [provider]  fluconazole (DIFLUCAN) 100 MG tablet Take 2 tablets (200 mg total) by mouth daily for 28 doses. 01/15/19 02/12/19  Georgette Shell, MD  gabapentin (NEURONTIN) 100 MG capsule Take 100 mg by mouth 3 (three) times daily. Open 1 capsule (100 mg) and administer contents via feeding tube three times daily    [provider]  glycopyrrolate (ROBINUL) 1 MG tablet Place 1 mg into feeding tube 2 (two) times daily.  11/18/18   [provider]  metoprolol tartrate (LOPRESSOR) 50 MG tablet Take 1 tablet (50 mg total) by mouth 2 (two) times daily. 12/21/17   Thurnell Lose, MD  mirtazapine (REMERON) 15 MG tablet Take 7.5 mg by mouth at bedtime.  12/24/18   [provider]  montelukast (SINGULAIR) 10 MG tablet Place 10 mg into feeding tube daily.     [provider]  Nutritional Supplements (FEEDING SUPPLEMENT, OSMOLITE 1.2 CAL,) LIQD Place 1,000 mLs into feeding tube continuous. 01/15/19   Georgette Shell, MD  ondansetron (ZOFRAN) 4 MG tablet Place 1 tablet (4 mg total) into feeding tube every 6 (six) hours as needed for nausea. 01/15/19   Georgette Shell, MD  pantoprazole sodium (PROTONIX) 40 mg/20 mL PACK Place 20 mLs (40 mg total) into feeding tube 2 (two) times daily. 01/02/19   Mariel Aloe, MD  pravastatin (PRAVACHOL) 20 MG tablet Place 1 tablet (20 mg total) into feeding tube daily at 6 PM. Patient taking differently: Place 20 mg daily into feeding tube.  01/10/17   Cherene Altes, MD  vitamin C (ASCORBIC ACID) 500 MG tablet Take 500 mg by mouth daily.    [provider]    Physical Exam: Vitals:  02/04/19 1645 02/04/19 1700 02/04/19 1800 02/04/19 1907  BP: 135/80 127/76  113/86  Pulse: 100 (!) 103 99 (!) 106  Resp: 13 14 13 18   Temp:      TempSrc:      SpO2: 98% 95% 93% 94%    Constitutional: Chronically ill-appearing elderly man resting in bed, nonverbal but attempting to speak and motioning with hands, NAD, calm, comfortable Eyes: PERRL, lids and conjunctivae normal ENMT: Mucous membranes are dry. Posterior pharynx clear of any exudate or lesions.poor dentition.  Neck: Tracheostomy in place currently on trach collar, normal, supple, no masses. Respiratory: clear to auscultation bilaterally, no wheezing, no crackles. Normal respiratory effort. No accessory muscle use.  Cardiovascular: Regular rate and rhythm, no murmurs / rubs / gallops. No extremity edema. Abdomen: PEG tube in place left abdomen, no tenderness, no masses palpated. No hepatosplenomegaly. Bowel sounds positive. GU: Foley in place Musculoskeletal: no clubbing / cyanosis.  Contractures on fingers of both hands.  ROM  slightly diminished RUE, but otherwise appears intact while resting in bed. Skin: no rashes, lesions, ulcers. No induration Neurologic: Nonverbal but awake and attempting to speak, strength and sensation appears intact Psychiatric: Awake and alert, nonverbal.    Labs on Admission: I have personally reviewed following labs and imaging studies  CBC: Recent Labs  Lab 02/04/19 1740  WBC 16.9*  NEUTROABS 11.8*  HGB 14.7  HCT 49.7  MCV 97.6  PLT [id number]*   Basic Metabolic Panel: Recent Labs  Lab 02/04/19 1740  NA 142  K 4.9  CL 96*  CO2 28  GLUCOSE 92  BUN 53*  CREATININE 3.08*  CALCIUM 10.3   GFR: CrCl cannot be calculated (Unknown ideal weight.). Liver Function Tests: Recent Labs  Lab 02/04/19 1740  AST 28  ALT 11  ALKPHOS 93  BILITOT 0.6  PROT 9.1*  ALBUMIN 3.8   No results for input(s): LIPASE, AMYLASE in the last 168 hours. No results for input(s): AMMONIA in the last 168 hours. Coagulation Profile: No results for input(s): INR, PROTIME in the last 168 hours. Cardiac Enzymes: No results for input(s): CKTOTAL, CKMB, CKMBINDEX, TROPONINI in the last 168 hours. BNP (last 3 results) No results for input(s): PROBNP in the last 8760 hours. HbA1C: No results for input(s): HGBA1C in the last 72 hours. CBG: No results for input(s): GLUCAP in the last 168 hours. Lipid Profile: No results for input(s): CHOL, HDL, LDLCALC, TRIG, CHOLHDL, LDLDIRECT in the last 72 hours. Thyroid Function Tests: No results for input(s): TSH, T4TOTAL, FREET4, T3FREE, THYROIDAB in the last 72 hours. Anemia Panel: No results for input(s): VITAMINB12, FOLATE, FERRITIN, TIBC, IRON, RETICCTPCT in the last 72 hours. Urine analysis:    Component Value Date/Time   COLORURINE YELLOW 01/13/2019 2039   APPEARANCEUR CLOUDY (A) 01/13/2019 2039   LABSPEC 1.016 01/13/2019 2039   PHURINE 8.0 01/13/2019 2039   GLUCOSEU NEGATIVE 01/13/2019 2039   HGBUR NEGATIVE 01/13/2019 2039   BILIRUBINUR NEGATIVE  01/13/2019 2039   KETONESUR NEGATIVE 01/13/2019 2039   PROTEINUR >=300 (A) 01/13/2019 2039   NITRITE NEGATIVE 01/13/2019 2039   LEUKOCYTESUR LARGE (A) 01/13/2019 2039    Radiological Exams on Admission: Dg Chest Portable 1 View  Result Date: 02/04/2019 CLINICAL DATA:  Decreased oxygen saturation, history of tracheostomy. EXAM: PORTABLE CHEST 1 VIEW COMPARISON:  01/21/2019 FINDINGS: Tracheostomy tube remains in place tip in the proximal to mid trachea. Cardiomediastinal contours are stable. Lungs are clear. Signs of aortic atherosclerosis. No acute bone process. IMPRESSION: No interval change in  the appearance of the tracheostomy tube. No acute abnormality. Electronically Signed   By: Zetta Bills M.D.   On: 02/04/2019 16:44    EKG: Independently reviewed. Sinus tachycardia, RAE.  Assessment/Plan Principal Problem:   Acute-on-chronic kidney injury [facility]) Active Problems:   Essential hypertension   History of cerebral infarction   Dysphagia, post-stroke   Foley catheter in place on admission   Tracheostomy tube present ([place])   Gastrostomy tube dependent ([place])   Chronic respiratory insufficiency  ANIV GELL is a 67 y.o. male with medical history significant for history of ischemic stroke with chronic respiratory insufficiency who is tracheostomy dependent, dysphagia and gastrostomy tube dependent, nonverbal, CKD stage II, hypertension, hyperlipidemia, depression, and requiring chronic indwelling Foley catheter who is admitted with acute on chronic kidney injury.   Acute on chronic stage II kidney injury: Suspect prerenal from hypovolemia in setting of reported emesis decreased feeding over the last day and a half. -S/p 1 L normal saline in the ED, continue maintenance fluids overnight and follow labs -Renal ultrasound obtained and pending -Check UA and urine studies -Foley catheter in place, patient is making adequate urine output  Tracheostomy dependent in setting of chronic  respiratory insufficiency: Is having increased secretions.  Chest x-ray not suggestive of pneumonia/pneumonitis. -Continue tracheostomy care with suctioning as needed -Continue Robinul  Gastrostomy tube dependent due to post stroke dysphagia: PEG tube in place.  He was given feeding supplement in the ED and appeared to tolerate well. -Will continue feeding supplement as tolerated -Consult to nutrition  Chronic indwelling Foley catheter: Continue Foley catheter.  Leukocytosis: Possibly reactive or from hemoconcentration.  No obvious infection, urinalysis is pending.  History of CVA: Chronically bedbound, nonverbal, trach, PEG, Foley dependent as above. -Continue Plavix and statin  Hypertension: Continue amlodipine, Lopressor.  Hyperlipidemia Continue pravastatin.  Depression: Continue mirtazapine.   DVT prophylaxis: Subcutaneous heparin Code Status: Full code, confirmed with sister Family Communication: Discussed with sister [name] by phone [phone] Disposition Plan: Pending clinical progress Consults called: None Admission status: Observation   Zada Finders MD [facility]  If 7PM-7AM, please contact night-coverage [email or web address]  02/04/2019, 7:57 PM

## 2019-02-04 NOTE — ED Notes (Signed)
Portable renal ultrasound in progress at bedside .

## 2019-02-04 NOTE — ED Notes (Signed)
Unable to establish peripheral IV access despite multiple attempts .

## 2019-02-04 NOTE — ED Provider Notes (Addendum)
[facility] EMERGENCY DEPARTMENT Provider Note   CSN: [id number] Arrival date & time: 02/04/19  1507     History   Chief Complaint Chief Complaint  Patient presents with  . Nausea    HPI Scott Rivera is a 68 y.o. male.     HPI   Level V caveat due to nonverbal status, trach.  Scott Rivera is a 68 y.o. male, with a history of CKD, HTN, stroke, presenting to the ED with vomiting for the past month. SPO2 down to 92% today on his typical 2 L/min supplemental O2.  Further details provided by the patient's sister.  Please see ED course section for details of that conversation. When asked if he has any current complaints, patient shakes his head "no."     Past Medical History:  Diagnosis Date  . Acute pulmonary edema (HCC)   . Anemia due to chronic kidney disease   . ARF (acute renal failure) ([place]) 10/19/2016  . Aspiration pneumonia ([place])   . Cerebellar infarct ([place]) 10/19/2016  . Chronic kidney disease, stage 3 (moderate)   . CKD (chronic kidney disease) stage 2, GFR 60-89 ml/min 01/02/2017  . 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 Gastrostomy tube obstruction ([place])   . HCAP (healthcare-associated pneumonia)   . Helicobacter pylori infection 09/21/2012  . History of adenomatous polyp of colon 07/28/2015  . Hypertension   . MDR Acinetobacter baumannii infection   . Pneumothorax, closed, traumatic, initial encounter 12/10/2016  . Renal insufficiency   . Rhabdomyolysis 10/19/2016  . S/P percutaneous endoscopic gastrostomy (PEG) tube placement ([place]) 03/30/2017  . Stage III pressure ulcer of sacral region ([place]) 12/17/2016  . Stroke ([place])   . UTI (urinary tract infection) 03/30/2017    Patient Active Problem List   Diagnosis Date Noted  . Acute-on-chronic kidney injury ([place]) 02/04/2019  . Hematemesis 01/14/2019  . Nausea vomiting and diarrhea 01/13/2019  . HCAP (healthcare-associated pneumonia) 01/08/2019  . Hyperkalemia  01/08/2019  . Aspiration pneumonia ([place]) 01/01/2019  . GI bleed 12/31/2018  . Pneumoperitoneum 04/09/2018  . Perforated viscus 04/09/2018  . 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 Aspiration of gastric contents   . Malnutrition of moderate degree 11/19/2017  . Aspiration pneumonia of right lower lobe ([place]) 11/16/2017  . Nausea and vomiting 11/16/2017  . Tracheostomy tube present ([place]) 09/25/2017  . Chronic respiratory insufficiency 07/24/2017  . Hematuria 06/26/2017  . Foley catheter in place on admission 03/30/2017  . UTI (urinary tract infection) 03/30/2017  . Diabetes mellitus ([place]) 03/14/2017  . History of tracheostomy 03/14/2017  . Percutaneous endoscopic gastrostomy status ([place]) 03/14/2017  . Gastroesophageal reflux disease without esophagitis 03/14/2017  . Acute respiratory failure with hypoxia ([place]) 03/02/2017  . Loculated pleural effusion   . CKD (chronic kidney disease) stage 2, GFR 60-89 ml/min 01/02/2017  . Fever   . Sepsis ([place]) 12/09/2016  . Anemia due to chronic kidney disease 12/09/2016  . Compensated metabolic alkalosis [id number]  . Goals of care, counseling/discussion   . Palliative care by specialist   . Aspiration into airway   . Dysphagia, post-stroke   . Tachypnea   . Leukocytosis   . Acute blood loss anemia   . Pressure injury of skin 10/20/2016  . Essential hypertension 10/19/2016  . History of cerebral infarction 10/19/2016  . Bilateral chronic knee pain 01/07/2016  . Tobacco abuse 12/23/2014  . Mixed hyperlipidemia 07/28/2013    Past Surgical History:  Procedure Laterality Date  . BIOPSY  01/01/2019   Procedure: BIOPSY;  Surgeon: Irving Copas., MD;  Location: [facility];  Service: Gastroenterology;;  . ESOPHAGOGASTRODUODENOSCOPY (EGD) WITH PROPOFOL Scott Rivera 01/01/2019   Procedure: ESOPHAGOGASTRODUODENOSCOPY (EGD) WITH PROPOFOL;  Surgeon: Irving Copas., MD;  Location: [facility];   Service: Gastroenterology;  Laterality: Scott Rivera;  . IR GASTROSTOMY TUBE MOD SED  10/27/2016  . IR PATIENT EVAL TECH 0-60 MINS  01/05/2017  . IR PATIENT EVAL TECH 0-60 MINS  01/09/2017  . IR PATIENT EVAL TECH 0-60 MINS  03/03/2017  . IR PATIENT EVAL TECH 0-60 MINS  04/03/2017  . IR REPLACE G-TUBE SIMPLE WO FLUORO  11/20/2017  . IR [place] TUBE PERCUT W/FLUORO  07/23/2017  . KNEE SURGERY    . TRACHEOSTOMY TUBE PLACEMENT          Home Medications    Prior to Admission medications   Medication Sig Start Date End Date Taking? Authorizing Provider  acetaminophen (TYLENOL) 500 MG tablet Place 1,000 mg into feeding tube 3 (three) times daily.     [provider]  Amino Acids-Protein Hydrolys (FEEDING SUPPLEMENT, PRO-STAT SUGAR FREE 64,) LIQD Place 30 mLs into feeding tube 2 (two) times daily. 01/15/19   Georgette Shell, MD  amLODipine (NORVASC) 10 MG tablet Place 1 tablet (10 mg total) into feeding tube daily. 11/23/17   Kayleen Memos, DO  cloNIDine (CATAPRES - DOSED IN MG/24 HR) 0.1 mg/24hr patch Place 1 patch (0.1 mg total) onto the skin every Tuesday. 01/21/19   Georgette Shell, MD  clopidogrel (PLAVIX) 75 MG tablet Place 1 tablet (75 mg total) into feeding tube daily. 01/04/19   Mariel Aloe, MD  Ferrous Sulfate (IRON) 325 (65 Fe) MG TABS Place 325 mg into feeding tube daily.     [provider]  fluconazole (DIFLUCAN) 100 MG tablet Take 2 tablets (200 mg total) by mouth daily for 28 doses. 01/15/19 02/12/19  Georgette Shell, MD  gabapentin (NEURONTIN) 100 MG capsule Take 100 mg by mouth 3 (three) times daily. Open 1 capsule (100 mg) and administer contents via feeding tube three times daily    [provider]  glycopyrrolate (ROBINUL) 1 MG tablet Place 1 mg into feeding tube 2 (two) times daily.  11/18/18   [provider]  metoprolol tartrate (LOPRESSOR) 50 MG tablet Take 1 tablet (50 mg total) by mouth 2 (two) times daily. 12/21/17   Thurnell Lose, MD  mirtazapine (REMERON) 15 MG tablet Take 7.5 mg by mouth at bedtime. 12/24/18   [provider]  montelukast (SINGULAIR) 10 MG tablet Place 10 mg into feeding tube daily.     [provider]  Nutritional Supplements (FEEDING SUPPLEMENT, OSMOLITE 1.2 CAL,) LIQD Place 1,000 mLs into feeding tube continuous. 01/15/19   Georgette Shell, MD  ondansetron (ZOFRAN) 4 MG tablet Place 1 tablet (4 mg total) into feeding tube every 6 (six) hours as needed for nausea. 01/15/19   Georgette Shell, MD  pantoprazole sodium (PROTONIX) 40 mg/20 mL PACK Place 20 mLs (40 mg total) into feeding tube 2 (two) times daily. 01/02/19   Mariel Aloe, MD  pravastatin (PRAVACHOL) 20 MG tablet Place 1 tablet (20 mg total) into feeding tube daily at 6 PM. Patient taking differently: Place 20 mg daily into feeding tube.  01/10/17   Cherene Altes, MD  vitamin C (ASCORBIC ACID) 500 MG tablet Take 500 mg by mouth daily.    [provider]    Family History Family History  Problem Relation Age of Onset  . Diabetes Mellitus II Brother   . CAD Neg Hx   . Stroke Neg Hx     Social History Social History   Tobacco Use  . Smoking status: Former Research scientist (life sciences)  . Smokeless tobacco: Never Used  . Tobacco comment: Smoked heavily until the day of his stroke  Substance Use Topics  . Alcohol use: Not Currently  . Drug use: Not Currently     Allergies   Patient has no known allergies.   Review of Systems Review of Systems  Unable to perform ROS: Patient nonverbal     Physical Exam Updated Vital Signs BP 119/78   Pulse 76   Temp 97.6 F (37.3 C) (Oral)   Resp 20   SpO2 100%   Physical Exam Vitals signs and nursing note reviewed.  Constitutional:      General: He is not in acute distress.    Appearance: He is well-developed. He is not diaphoretic.  HENT:     Head: Normocephalic and atraumatic.     Mouth/Throat:     Pharynx: Oropharynx is clear.     Comments: Lips  are dry. Patient has tracheostomy tube in place.  Appears to be patent.  There is production of thin appearing sputum from the tube.  This was able to be suctioned by respiratory therapist. SPO2 of 98% on 10 L/min via trach collar. Eyes:     Conjunctiva/sclera: Conjunctivae normal.  Neck:     Musculoskeletal: Neck supple.  Cardiovascular:     Rate and Rhythm: Normal rate and regular rhythm.     Pulses: Normal pulses.          Radial pulses are 2+ on the right side and 2+ on the left side.       Posterior tibial pulses are 2+ on the right side and 2+ on the left side.     Heart sounds: Normal heart sounds.     Comments: Tactile temperature in the extremities appropriate and equal bilaterally. Pulmonary:     Effort: Pulmonary effort is normal. No respiratory distress.     Breath sounds: Rhonchi (global) present.  Abdominal:     Palpations: Abdomen is soft.     Tenderness: There is no abdominal tenderness. There is no guarding.     Comments: PEG tube in place.  Appears to be secure.  No noted erythema, swelling, or other skin abnormality to the surrounding skin.  Musculoskeletal:     Right lower leg: No edema.     Left lower leg: No edema.  Lymphadenopathy:     Cervical: No cervical adenopathy.  Skin:    General: Skin is warm and dry.  Neurological:     Mental Status: He is alert.  Psychiatric:        Mood and Affect: Mood and affect normal.        Speech: Speech normal.        Behavior: Behavior normal.      ED Treatments / Results  Labs (all labs ordered are listed, but only abnormal results are displayed) Labs Reviewed  COMPREHENSIVE METABOLIC PANEL - Abnormal; Notable for the following components:      Result Value   Chloride 96 (*)    BUN 53 (*)    Creatinine, Ser 3.08 (*)    Total Protein 9.1 (*)    GFR calc non Af Amer 20 (*)    GFR calc Af  Amer 23 (*)    Anion gap 18 (*)    All other components within normal limits  CBC WITH DIFFERENTIAL/PLATELET - Abnormal;  Notable for the following components:   WBC 16.9 (*)    MCHC 29.6 (*)    Platelets 463 (*)    Neutro Abs 11.8 (*)    Monocytes Absolute 2.1 (*)    Abs Immature Granulocytes 0.10 (*)    All other components within normal limits  SARS CORONAVIRUS 2 (TAT 6-24 HRS)  CBC  BASIC METABOLIC PANEL  URINALYSIS, ROUTINE W REFLEX MICROSCOPIC  SODIUM, URINE, RANDOM  OSMOLALITY, URINE  OSMOLALITY   BUN  Date Value Ref Range Status  02/04/2019 53 (H) 8 - 23 mg/dL Final  01/14/2019 23 8 - 23 mg/dL Final  01/13/2019 22 8 - 23 mg/dL Final  01/11/2019 16 8 - 23 mg/dL Final   Creatinine, Ser  Date Value Ref Range Status  02/04/2019 3.08 (H) 0.61 - 1.24 mg/dL Final  01/14/2019 1.05 0.61 - 1.24 mg/dL Final  01/13/2019 1.02 0.61 - 1.24 mg/dL Final  01/11/2019 0.91 0.61 - 1.24 mg/dL Final    EKG EKG Interpretation  Date/Time:  Tuesday February 04 2019 15:28:15 EDT Ventricular Rate:  104 PR Interval:    QRS Duration: 90 QT Interval:  330 QTC Calculation: 434 R Axis:   76 Text Interpretation:  Sinus tachycardia Right atrial enlargement Consider right ventricular hypertrophy Borderline ST elevation, anterior leads Confirmed by Virgel Manifold [phone]) on 02/04/2019 6:03:06 PM   Radiology US Renal  Result Date: 02/04/2019 CLINICAL DATA:  Acute renal insufficiency. EXAM: RENAL / URINARY TRACT ULTRASOUND COMPLETE COMPARISON:  11/19/2018 CT.  10/16/2016 renal ultrasound. FINDINGS: Right Kidney: Renal measurements: 9.4 x 5.9 x 5.8 cm = volume: 168 mL . Echogenicity within normal limits. No mass or hydronephrosis visualized. Left Kidney: Renal measurements: Severely atrophic on CT.  Not visualized today. Bladder: Decompressed around a Foley catheter. Other: None. IMPRESSION: Marked left renal atrophy.  Normal right kidney. Electronically Signed   By: Abigail Miyamoto M.D.   On: 02/04/2019 20:39   Dg Chest Portable 1 View  Result Date: 02/04/2019 CLINICAL DATA:  Decreased oxygen saturation, history of  tracheostomy. EXAM: PORTABLE CHEST 1 VIEW COMPARISON:  01/21/2019 FINDINGS: Tracheostomy tube remains in place tip in the proximal to mid trachea. Cardiomediastinal contours are stable. Lungs are clear. Signs of aortic atherosclerosis. No acute bone process. IMPRESSION: No interval change in the appearance of the tracheostomy tube. No acute abnormality. Electronically Signed   By: Zetta Bills M.D.   On: 02/04/2019 16:44    Procedures Procedures (including critical care time)  Medications Ordered in ED Medications  feeding supplement (JEVITY 1.2 CAL) liquid 237 mL (0 mLs Per Tube Stopped 02/04/19 1937)  sodium chloride 0.9 % bolus 1,000 mL (has no administration in time range)  heparin injection 5,000 Units (has no administration in time range)  0.9 %  sodium chloride infusion (has no administration in time range)  ondansetron (ZOFRAN) tablet 4 mg (has no administration in time range)    Or  ondansetron (ZOFRAN) injection 4 mg (has no administration in time range)  acetaminophen (TYLENOL) tablet 1,000 mg (has no administration in time range)  feeding supplement (PRO-STAT SUGAR FREE 64) liquid 30 mL (has no administration in time range)  amLODipine (NORVASC) tablet 10 mg (has no administration in time range)  clopidogrel (PLAVIX) tablet 75 mg (has no administration in time range)  Iron TABS 325 mg (has no administration in time range)  fluconazole (DIFLUCAN) tablet 200 mg (has no administration in time range)  gabapentin (NEURONTIN) capsule 100 mg (has no administration in time range)  glycopyrrolate (ROBINUL) tablet 1 mg (has no administration in time range)  metoprolol tartrate (LOPRESSOR) tablet 50 mg (has no administration in time range)  mirtazapine (REMERON) tablet 7.5 mg (has no administration in time range)  montelukast (SINGULAIR) tablet 10 mg (has no administration in time range)  pantoprazole sodium (PROTONIX) 40 mg/20 mL oral suspension 40 mg (has no administration in time  range)  pravastatin (PRAVACHOL) tablet 20 mg (has no administration in time range)     Initial Impression / Assessment and Plan / ED Course  I have reviewed the triage vital signs and the nursing notes.  Pertinent labs & imaging results that were available during my care of the patient were reviewed by me and considered in my medical decision making (see chart for details).  Clinical Course as of Feb 04 2043  Tue Feb 04, 2019  1615 Spoke with patient's sister. Reports vomiting after feeds for about 5 weeks. Thinks it may have started when patient's trach tube was last switched out while in the hospital. Patient will gag and cough. Brown substance that looks similar to the feeds is noted to come from the mouth and trach tube. "I've been taking care of my brother for over two years. The ambulance people seen it, the nurses seen it, I've seen it. I've sent him everywhere and no one is helping him." "The don't see him vomiting at the hospital and that is because they don't feed him." She was very difficult to get more information from. She would just keep repeating the above.  When I asked about the rate of feeds, at first she stated she was doing it as a push "all at once." Then she said it was on a pump, but could not give a rate. 18mL feed and 172mL of water every 3 hours. Yesterday did only water and had no vomiting.   [SJ]  [id number] RN gave the tube feeding. Patient seems to be handling this well.    [SJ]  1933 Attempted to call patient's sister to give an update. No answer, no VM available.    [SJ]  1948 Spoke with Dr. Posey Pronto, hospitalist. Agrees to admit the patient.    [SJ]    Clinical Course User Index [SJ] Onesha Krebbs C, PA-C       Patient presents with vomiting following tube feeds. Patient is nontoxic appearing, afebrile, not tachycardic, not tachypneic, not hypotensive, and is in no apparent distress.  Though he shows no increased work of breathing, he does have an increased  oxygen requirement.  No acute abnormalities on chest x-ray. Leukocytosis noted without source at this time.   Creatinine and BUN noted to be 3.08 and 53, respectively.  Previously, creatinine and BUN were 1.05 and 23, three weeks ago. We did not initially obtain UA due to the thought of likely chronic colonization from patient's Foley catheter.  However, this order was placed and results were pending at time of admission. Patient had 75 cc of urine in the bladder and had 300 cc of urine output in the Foley prior to admission.  Findings and plan of care discussed with Virgel Manifold, MD.   Final Clinical Impressions(s) / ED Diagnoses   Final diagnoses:  AKI (acute kidney injury) [facility])    ED Discharge Orders    None       Kaziah Krizek C, PA-C  02/04/19 2049    Lorayne Bender, PA-C 02/04/19 2050    Virgel Manifold, MD 02/05/19 1640

## 2019-02-04 NOTE — Progress Notes (Signed)
Patient arrived via EMS with chronic trach.  Placed on 40% ATC and changed patient's trach ties with RN assistance.  Patient tolerating well.  Will continue to monitor.

## 2019-02-04 NOTE — ED Notes (Addendum)
ED TO INPATIENT HANDOFF REPORT  ED Nurse Name and Phone #:    S Name/Age/Gender Scott Rivera 68 y.o. male Room/Bed: 016C/016C  Code Status   Code Status: Prior  Home/SNF/Other Home   Triage Complete: Triage complete  Chief Complaint N/V  Triage Note Pt BIB [facility]. Pt had N/V x 2 days. Pt lives at home with sister (primary care giver). Normally wears 2L at home but upon arrival pt was 92%. Hx of stroke  EMS VS:  BP 100/60 HR: 120 18 RR O2: 2L 92%   Allergies No Known Allergies  Level of Care/Admitting Diagnosis ED Disposition    ED Disposition Condition Comment   Admit  Hospital Area: [place] [[id number]]  Level of Care: Med-Surg [16]  I expect the patient will be discharged within 24 hours: Yes  LOW acuity---Tx typically complete <24 hrs---ACUTE conditions typically can be evaluated <24 hours---LABS likely to return to acceptable levels <24 hours---IS near functional baseline---EXPECTED to return to current living arrangement---NOT newly hypoxic: Meets criteria for 5C-Observation unit  Covid Evaluation: Asymptomatic Screening Protocol (No Symptoms)  Diagnosis: Acute-on-chronic kidney injury [facility] [id number]  Admitting Physician: Lenore Cordia [id number]  Attending Physician: Lenore Cordia [id number]  PT Class (Do Not Modify): Observation [104]  PT Acc Code (Do Not Modify): Observation [10022]       B Medical/Surgery History Past Medical History:  Diagnosis Date  . Acute pulmonary edema (HCC)   . Anemia due to chronic kidney disease   . ARF (acute renal failure) ([place]) 10/19/2016  . Aspiration pneumonia ([place])   . Cerebellar infarct ([place]) 10/19/2016  . Chronic kidney disease, stage 3 (moderate)   . CKD (chronic kidney disease) stage 2, GFR 60-89 ml/min 01/02/2017  . 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 Gastrostomy tube obstruction ([place])   . HCAP (healthcare-associated pneumonia)   . Helicobacter pylori infection  09/21/2012  . History of adenomatous polyp of colon 07/28/2015  . Hypertension   . MDR Acinetobacter baumannii infection   . Pneumothorax, closed, traumatic, initial encounter 12/10/2016  . Renal insufficiency   . Rhabdomyolysis 10/19/2016  . S/P percutaneous endoscopic gastrostomy (PEG) tube placement ([place]) 03/30/2017  . Stage III pressure ulcer of sacral region ([place]) 12/17/2016  . Stroke ([place])   . UTI (urinary tract infection) 03/30/2017   Past Surgical History:  Procedure Laterality Date  . BIOPSY  01/01/2019   Procedure: BIOPSY;  Surgeon: Irving Copas., MD;  Location: [facility];  Service: Gastroenterology;;  . ESOPHAGOGASTRODUODENOSCOPY (EGD) WITH PROPOFOL N/A 01/01/2019   Procedure: ESOPHAGOGASTRODUODENOSCOPY (EGD) WITH PROPOFOL;  Surgeon: Irving Copas., MD;  Location: [facility];  Service: Gastroenterology;  Laterality: N/A;  . IR GASTROSTOMY TUBE MOD SED  10/27/2016  . IR PATIENT EVAL TECH 0-60 MINS  01/05/2017  . IR PATIENT EVAL TECH 0-60 MINS  01/09/2017  . IR PATIENT EVAL TECH 0-60 MINS  03/03/2017  . IR PATIENT EVAL TECH 0-60 MINS  04/03/2017  . IR REPLACE G-TUBE SIMPLE WO FLUORO  11/20/2017  . IR [place] TUBE PERCUT W/FLUORO  07/23/2017  . KNEE SURGERY    . TRACHEOSTOMY TUBE PLACEMENT       A IV Location/Drains/Wounds Patient Lines/Drains/Airways Status   Active Line/Drains/Airways    Name:   Placement date:   Placement time:   Site:   Days:   Gastrostomy/Enterostomy Percutaneous endoscopic gastrostomy (PEG) LLQ   11/14/16    -    LLQ   812  Urethral Catheter [place] Ours RN 16 Fr.   12/31/18    1837    -   35   Tracheostomy [place] 4 mm Uncuffed   02/04/19    1540    4 mm   less than 1   Wound / Incision (Open or Dehisced) small blister pustules on back arms and abdomen   -    -    -             Intake/Output Last 24 hours  Intake/Output Summary (Last 24 hours) at 02/04/2019 2000 Last data filed at 02/04/2019 1937 Gross per 24 hour   Intake -  Output 300 ml  Net -300 ml    Labs/Imaging Results for orders placed or performed during the hospital encounter of 02/04/19 (from the past 48 hour(s))  Comprehensive metabolic panel     Status: Abnormal   Collection Time: 02/04/19  5:40 PM  Result Value Ref Range   Sodium 142 135 - 145 mmol/L   Potassium 4.9 3.5 - 5.1 mmol/L   Chloride 96 (L) 98 - 111 mmol/L   CO2 28 22 - 32 mmol/L   Glucose, Bld 92 70 - 99 mg/dL   BUN 53 (H) 8 - 23 mg/dL   Creatinine, Ser 3.08 (H) 0.61 - 1.24 mg/dL   Calcium 10.3 8.9 - 10.3 mg/dL   Total Protein 9.1 (H) 6.5 - 8.1 g/dL   Albumin 3.8 3.5 - 5.0 g/dL   AST 28 15 - 41 U/L   ALT 11 0 - 44 U/L   Alkaline Phosphatase 93 38 - 126 U/L   Total Bilirubin 0.6 0.3 - 1.2 mg/dL   GFR calc non Af Amer 20 (L) >60 mL/min   GFR calc Af Amer 23 (L) >60 mL/min   Anion gap 18 (H) 5 - 15    Comment: Performed at [facility], [address] [place], [place] [address]  CBC with Differential     Status: Abnormal   Collection Time: 02/04/19  5:40 PM  Result Value Ref Range   WBC 16.9 (H) 4.0 - 10.5 K/uL   RBC 5.09 4.22 - 5.81 MIL/uL   Hemoglobin 14.7 13.0 - 17.0 g/dL   HCT 49.7 39.0 - 52.0 %   MCV 97.6 80.0 - 100.0 fL   MCH 28.9 26.0 - 34.0 pg   MCHC 29.6 (L) 30.0 - 36.0 g/dL   RDW 13.7 11.5 - 15.5 %   Platelets 463 (H) 150 - 400 K/uL    Comment: REPEATED TO VERIFY   nRBC 0.0 0.0 - 0.2 %   Neutrophils Relative % 70 %   Neutro Abs 11.8 (H) 1.7 - 7.7 K/uL   Lymphocytes Relative 14 %   Lymphs Abs 2.4 0.7 - 4.0 K/uL   Monocytes Relative 12 %   Monocytes Absolute 2.1 (H) 0.1 - 1.0 K/uL   Eosinophils Relative 2 %   Eosinophils Absolute 0.4 0.0 - 0.5 K/uL   Basophils Relative 1 %   Basophils Absolute 0.1 0.0 - 0.1 K/uL   Immature Granulocytes 1 %   Abs Immature Granulocytes 0.10 (H) 0.00 - 0.07 K/uL    Comment: Performed at [facility], [address] [place], [place] [address]   Dg Chest Portable 1 View  Result Date:  02/04/2019 CLINICAL DATA:  Decreased oxygen saturation, history of tracheostomy. EXAM: PORTABLE CHEST 1 VIEW COMPARISON:  01/21/2019 FINDINGS: Tracheostomy tube remains in place tip in the proximal to mid trachea. Cardiomediastinal contours  are stable. Lungs are clear. Signs of aortic atherosclerosis. No acute bone process. IMPRESSION: No interval change in the appearance of the tracheostomy tube. No acute abnormality. Electronically Signed   By: Zetta Bills M.D.   On: 02/04/2019 16:44    Pending Labs Unresulted Labs (From admission, onward)    Start     Ordered   02/04/19 1614  SARS CORONAVIRUS 2 (TAT 6-24 HRS) Nasopharyngeal Nasopharyngeal Swab  (Asymptomatic/Tier 2 Patients Labs)  Once,   STAT    Question Answer Comment  Is this test for diagnosis or screening Screening   Symptomatic for COVID-19 as defined by CDC No   Hospitalized for COVID-19 No   Admitted to ICU for COVID-19 No   Previously tested for COVID-19 Yes   Resident in a congregate (group) care setting No   Employed in healthcare setting No      02/04/19 1614          Vitals/Pain Today's Vitals   02/04/19 1700 02/04/19 1800 02/04/19 1907 02/04/19 1909  BP: 127/76  113/86   Pulse: (!) 103 99 (!) 106   Resp: 14 13 18    Temp:      TempSrc:      SpO2: 95% 93% 94%   PainSc:   0-No pain 0-No pain    Isolation Precautions No active isolations  Medications Medications  feeding supplement (JEVITY 1.2 CAL) liquid 237 mL (0 mLs Per Tube Stopped 02/04/19 1937)  sodium chloride 0.9 % bolus 1,000 mL (has no administration in time range)    Mobility non-ambulatory High fall risk   Focused Assessments Pulmonary Assessment Handoff:  Lung sounds: Bilateral Breath Sounds: Rhonchi L Breath Sounds: Rhonchi R Breath Sounds: Rhonchi O2 Device: Tracheostomy Collar O2 Flow Rate (L/min): 10 L/min   , Tracheostomy collar 10 LPM 40% humidified air. No family member present.    R Recommendations: See Admitting  Provider Note  Report given to:   Additional Notes:

## 2019-02-05 DIAGNOSIS — Z20828 Contact with and (suspected) exposure to other viral communicable diseases: Secondary | ICD-10-CM | POA: Diagnosis present

## 2019-02-05 DIAGNOSIS — Z7401 Bed confinement status: Secondary | ICD-10-CM | POA: Diagnosis not present

## 2019-02-05 DIAGNOSIS — I10 Essential (primary) hypertension: Secondary | ICD-10-CM | POA: Diagnosis not present

## 2019-02-05 DIAGNOSIS — F329 Major depressive disorder, single episode, unspecified: Secondary | ICD-10-CM | POA: Diagnosis present

## 2019-02-05 DIAGNOSIS — Z930 Tracheostomy status: Secondary | ICD-10-CM | POA: Diagnosis not present

## 2019-02-05 DIAGNOSIS — Z87891 Personal history of nicotine dependence: Secondary | ICD-10-CM | POA: Diagnosis not present

## 2019-02-05 DIAGNOSIS — E861 Hypovolemia: Secondary | ICD-10-CM | POA: Diagnosis present

## 2019-02-05 DIAGNOSIS — Z931 Gastrostomy status: Secondary | ICD-10-CM | POA: Diagnosis not present

## 2019-02-05 DIAGNOSIS — Z978 Presence of other specified devices: Secondary | ICD-10-CM | POA: Diagnosis not present

## 2019-02-05 DIAGNOSIS — N261 Atrophy of kidney (terminal): Secondary | ICD-10-CM | POA: Diagnosis present

## 2019-02-05 DIAGNOSIS — N183 Chronic kidney disease, stage 3 unspecified: Secondary | ICD-10-CM | POA: Diagnosis present

## 2019-02-05 DIAGNOSIS — N179 Acute kidney failure, unspecified: Secondary | ICD-10-CM | POA: Diagnosis present

## 2019-02-05 DIAGNOSIS — D72829 Elevated white blood cell count, unspecified: Secondary | ICD-10-CM | POA: Diagnosis present

## 2019-02-05 DIAGNOSIS — N182 Chronic kidney disease, stage 2 (mild): Secondary | ICD-10-CM | POA: Diagnosis not present

## 2019-02-05 DIAGNOSIS — Z7902 Long term (current) use of antithrombotics/antiplatelets: Secondary | ICD-10-CM | POA: Diagnosis not present

## 2019-02-05 DIAGNOSIS — E785 Hyperlipidemia, unspecified: Secondary | ICD-10-CM | POA: Diagnosis present

## 2019-02-05 DIAGNOSIS — I69391 Dysphagia following cerebral infarction: Secondary | ICD-10-CM | POA: Diagnosis not present

## 2019-02-05 DIAGNOSIS — D631 Anemia in chronic kidney disease: Secondary | ICD-10-CM | POA: Diagnosis present

## 2019-02-05 DIAGNOSIS — J9611 Chronic respiratory failure with hypoxia: Secondary | ICD-10-CM | POA: Diagnosis present

## 2019-02-05 DIAGNOSIS — R112 Nausea with vomiting, unspecified: Secondary | ICD-10-CM | POA: Diagnosis present

## 2019-02-05 DIAGNOSIS — R0689 Other abnormalities of breathing: Secondary | ICD-10-CM | POA: Diagnosis not present

## 2019-02-05 DIAGNOSIS — Z833 Family history of diabetes mellitus: Secondary | ICD-10-CM | POA: Diagnosis not present

## 2019-02-05 DIAGNOSIS — K5900 Constipation, unspecified: Secondary | ICD-10-CM | POA: Diagnosis not present

## 2019-02-05 DIAGNOSIS — R1310 Dysphagia, unspecified: Secondary | ICD-10-CM | POA: Diagnosis present

## 2019-02-05 LAB — COMPREHENSIVE METABOLIC PANEL
ALT: 17 U/L (ref 0–44)
AST: 19 U/L (ref 15–41)
Albumin: 3.4 g/dL — ABNORMAL LOW (ref 3.5–5.0)
Alkaline Phosphatase: 85 U/L (ref 38–126)
Anion gap: 10 (ref 5–15)
BUN: 70 mg/dL — ABNORMAL HIGH (ref 8–23)
CO2: 34 mmol/L — ABNORMAL HIGH (ref 22–32)
Calcium: 9.8 mg/dL (ref 8.9–10.3)
Chloride: 97 mmol/L — ABNORMAL LOW (ref 98–111)
Creatinine, Ser: 2.91 mg/dL — ABNORMAL HIGH (ref 0.61–1.24)
GFR calc Af Amer: 25 mL/min — ABNORMAL LOW (ref >60)
GFR calc non Af Amer: 21 mL/min — ABNORMAL LOW (ref >60)
Glucose, Bld: 94 mg/dL (ref 70–99)
Potassium: 4.3 mmol/L (ref 3.5–5.1)
Sodium: 141 mmol/L (ref 135–145)
Total Bilirubin: 0.7 mg/dL (ref 0.3–1.2)
Total Protein: 8.2 g/dL — ABNORMAL HIGH (ref 6.5–8.1)

## 2019-02-05 LAB — BASIC METABOLIC PANEL
Anion gap: 18 — ABNORMAL HIGH (ref 5–15)
BUN: 61 mg/dL — ABNORMAL HIGH (ref 8–23)
CO2: 25 mmol/L (ref 22–32)
Calcium: 9.8 mg/dL (ref 8.9–10.3)
Chloride: 99 mmol/L (ref 98–111)
Creatinine, Ser: 3 mg/dL — ABNORMAL HIGH (ref 0.61–1.24)
GFR calc Af Amer: 24 mL/min — ABNORMAL LOW (ref >60)
GFR calc non Af Amer: 20 mL/min — ABNORMAL LOW (ref >60)
Glucose, Bld: 83 mg/dL (ref 70–99)
Potassium: 5.2 mmol/L — ABNORMAL HIGH (ref 3.5–5.1)
Sodium: 142 mmol/L (ref 135–145)

## 2019-02-05 LAB — OSMOLALITY, URINE: Osmolality, Ur: 443 mOsm/kg (ref 300–900)

## 2019-02-05 LAB — CBC
HCT: 45.8 % (ref 39.0–52.0)
Hemoglobin: 14 g/dL (ref 13.0–17.0)
MCH: 29.7 pg (ref 26.0–34.0)
MCHC: 30.6 g/dL (ref 30.0–36.0)
MCV: 97 fL (ref 80.0–100.0)
Platelets: 440 10*3/uL — ABNORMAL HIGH (ref 150–400)
RBC: 4.72 MIL/uL (ref 4.22–5.81)
RDW: 13.7 % (ref 11.5–15.5)
WBC: 16.4 10*3/uL — ABNORMAL HIGH (ref 4.0–10.5)
nRBC: 0 % (ref 0.0–0.2)

## 2019-02-05 LAB — OSMOLALITY: Osmolality: 318 mOsm/kg — ABNORMAL HIGH (ref 275–295)

## 2019-02-05 LAB — SODIUM, URINE, RANDOM: Sodium, Ur: 27 mmol/L

## 2019-02-05 LAB — CREATININE, URINE, RANDOM: Creatinine, Urine: 346.67 mg/dL

## 2019-02-05 MED ORDER — SODIUM CHLORIDE 0.9 % IV SOLN: INTRAVENOUS | Status: AC

## 2019-02-05 NOTE — ED Notes (Signed)
[name] Sister [phone]  [phone]   Sister would like pt updates

## 2019-02-05 NOTE — ED Notes (Signed)
Patient mouthing words to this RN that were unable to be made out, this RN asked patient if his IV line could be flushed, patient refused and swatted this RNs hands away. Patient requesting to have peri care done, peri care performed, pt readjusted on stretcher and lights turned down per pts request.

## 2019-02-05 NOTE — Progress Notes (Signed)
Patient assessed for new IV per RN request.  Assessed with ultrasound to find that only sites that could be accessed are in the [facility] which would lead to same current issue with IV in [facility] that is constantly beeping due to patient not able to hold arm extended for more than a short time with constant reminder to do so.  Patient does not qualify for PICC or ML due to renal status but patient is also unable to extend arm out for the procedure to be perform. Second IV RN to assess.  Carolee Rota, RN

## 2019-02-05 NOTE — ED Notes (Signed)
admitting Provider at bedside. 

## 2019-02-05 NOTE — ED Notes (Signed)
Patient's sister sarah/caretaker called for an update, this RN advised pts sister patient is stable and still waiting on admission bed. [name] advised she would come visit patient once he was in a room upstairs.

## 2019-02-05 NOTE — Progress Notes (Signed)
[facility] PROGRESS NOTE  Scott Rivera [id number] DOB: Jul 12, 1950 DOA: 02/04/2019 [facility]CP: [facility]  Assessment/Plan: Acute on chronic stage II kidney injury: Suspect prerenal from hypovolemia in setting of reported emesis decreased feeding over the last day and a half. S/p 1 L normal saline in the ED. Renal ultrasound reveals left renal atrophy. Creatinine with no improvement despite fluids. Foley catheter in place, patient is making adequate urine output. Urine sodium and urine osmolality within limits of normal. Serum osmolality 318.  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 PEG tube in place.  has tolerated continuous feeds since admission. supplement in the ED and appeared to tolerate well. -Will continue feeding supplement as tolerated -Consult to nutrition  Chronic indwelling Foley catheter: Continue Foley catheter.  Leukocytosis: Possibly reactive or from hemoconcentration.  No obvious infection, urinalysis is pending.  History of CVA: Chronically bedbound, nonverbal, trach, PEG, Foley dependent as above. -Continue Plavix and statin  Hypertension: Continue amlodipine, Lopressor.  Hyperlipidemia Continue pravastatin.  Depression: Continue mirtazapine.  Code Status: full Family Communication: sister at home Disposition Plan: home when ready   Consultants:    Procedures:    Antibiotics:    HPI/Subjective: Awake alert irritated. Denies pain/discomfort  Objective: Vitals:   02/05/19 1227 02/05/19 1351  BP:  102/78   Pulse: 75 72  Resp: 18 16  Temp:    SpO2: 96% 100%    Intake/Output Summary (Last 24 hours) at 02/05/2019 1503 Last data filed at 02/04/2019 1937 Gross per 24 hour  Intake -  Output 300 ml  Net -300 ml   There were no vitals filed for this visit.  Exam:   General:  Awake alert thin no acute distress uncooperative with exam  Cardiovascular: rrr no mgr no LE edema  Respiratory: normal effort trach intact BS clear no wheeze  Abdomen: flat soft +BS no guarding or rebounding peg intact  Musculoskeletal: joints without swelling/erythema   Data Reviewed: Basic Metabolic Panel: Recent Labs  Lab 02/04/19 1740 02/05/19 0323  NA 142 142  K 4.9 5.2*  CL 96* 99  CO2 28 25  GLUCOSE 92 83  BUN 53* 61*  CREATININE 3.08* 3.00*  CALCIUM 10.3 9.8   Liver Function Tests: Recent Labs  Lab 02/04/19 1740  AST 28  ALT 11  ALKPHOS 93  BILITOT 0.6  PROT 9.1*  ALBUMIN 3.8   No results for input(s): LIPASE, AMYLASE in the last 168 hours. No results for input(s): AMMONIA in the last 168 hours. CBC: Recent Labs  Lab 02/04/19 1740 02/05/19 0600  WBC 16.9* 16.4*  NEUTROABS 11.8*  --   HGB 14.7 14.0  HCT 49.7 45.8  MCV 97.6 97.0  PLT 463* 440*   Cardiac Enzymes: No results for input(s): CKTOTAL, CKMB, CKMBINDEX, TROPONINI in the last 168 hours. BNP (last 3 results) No results for input(s): BNP in the last 8760 hours.  ProBNP (last 3 results) No results for input(s): PROBNP in the last 8760 hours.  CBG: No results for input(s): GLUCAP in the last 168 hours.  Recent Results (from the past 240 hour(s))  SARS CORONAVIRUS 2 (TAT 6-24 HRS) Nasopharyngeal Nasopharyngeal Swab     Status: None   Collection  Time: 02/04/19  5:40 PM   Specimen: Nasopharyngeal Swab  Result Value Ref Range Status   SARS Coronavirus 2 NEGATIVE NEGATIVE Final    Comment: (NOTE) SARS-CoV-2 target nucleic acids are NOT DETECTED. The SARS-CoV-2 RNA is generally detectable in upper and  lower respiratory specimens during the acute phase of infection. Negative results do not preclude SARS-CoV-2 infection, do not rule out co-infections with other pathogens, and should not be used as the sole basis for treatment or other patient management decisions. Negative results must be combined with clinical observations, patient history, and epidemiological information. The expected result is Negative. Fact Sheet for Patients: SugarRoll.be Fact Sheet for Healthcare Providers: https://www.woods-mathews.com/ This test is not yet approved or cleared by the [place] FDA and  has been authorized for detection and/or diagnosis of SARS-CoV-2 by FDA under an Emergency Use Authorization (EUA). This EUA will remain  in effect (meaning this test can be used) for the duration of the COVID-19 declaration under Section 56 4(b)(1) of the Act, 21 U.S.C. section 360bbb-3(b)(1), unless the authorization is terminated or revoked sooner. Performed at [facility], [place] [address], [place], [place] [address]      Studies: US Renal  Result Date: 02/04/2019 CLINICAL DATA:  Acute renal insufficiency. EXAM: RENAL / URINARY TRACT ULTRASOUND COMPLETE COMPARISON:  11/19/2018 CT.  10/16/2016 renal ultrasound. FINDINGS: Right Kidney: Renal measurements: 9.4 x 5.9 x 5.8 cm = volume: 168 mL . Echogenicity within normal limits. No mass or hydronephrosis visualized. Left Kidney: Renal measurements: Severely atrophic on CT.  Not visualized today. Bladder: Decompressed around a Foley catheter. Other: None. IMPRESSION: Marked left renal atrophy.  Normal right kidney. Electronically Signed   By: Abigail Miyamoto M.D.   On: 02/04/2019 20:39   Dg Chest Portable 1 View  Result Date: 02/04/2019 CLINICAL DATA:  Decreased oxygen saturation, history of tracheostomy. EXAM: PORTABLE CHEST 1 VIEW COMPARISON:  01/21/2019 FINDINGS: Tracheostomy tube remains in place tip in the  proximal to mid trachea. Cardiomediastinal contours are stable. Lungs are clear. Signs of aortic atherosclerosis. No acute bone process. IMPRESSION: No interval change in the appearance of the tracheostomy tube. No acute abnormality. Electronically Signed   By: Zetta Bills M.D.   On: 02/04/2019 16:44    Scheduled Meds: . acetaminophen  1,000 mg Per Tube TID  . amLODipine  10 mg Per Tube Daily  . clopidogrel  75 mg Per Tube Daily  . feeding supplement (JEVITY 1.2 CAL)  237 mL Per Tube Q24H  . feeding supplement (PRO-STAT SUGAR FREE 64)  30 mL Per Tube BID  . ferrous sulfate  300 mg Per Tube Daily  . fluconazole  200 mg Oral Daily  . gabapentin  100 mg Oral TID  . glycopyrrolate  1 mg Per Tube BID  . heparin  5,000 Units Subcutaneous Q8H  . metoprolol tartrate  50 mg Oral BID  . mirtazapine  7.5 mg Oral QHS  . montelukast  10 mg Per Tube Daily  . pantoprazole sodium  40 mg Per Tube BID  . pravastatin  20 mg Per Tube q1800   Continuous Infusions:  Principal Problem:   Acute-on-chronic kidney injury ([place]) Active Problems:   Essential hypertension   Tracheostomy tube present (HCC)   Chronic respiratory insufficiency   Dysphagia, post-stroke   Foley catheter in place on admission   Gastrostomy tube dependent ([place])   Left renal atrophy   History of cerebral infarction    Time spent: [place]  M NP  [facility]  If 7PM-7AM, please contact night-coverage at [email or web address], password [facility] 02/05/2019, 3:03 PM  LOS: 0 days

## 2019-02-06 DIAGNOSIS — I10 Essential (primary) hypertension: Secondary | ICD-10-CM

## 2019-02-06 LAB — CBC
HCT: 42.4 % (ref 39.0–52.0)
Hemoglobin: 12.9 g/dL — ABNORMAL LOW (ref 13.0–17.0)
MCH: 29.3 pg (ref 26.0–34.0)
MCHC: 30.4 g/dL (ref 30.0–36.0)
MCV: 96.4 fL (ref 80.0–100.0)
Platelets: UNDETERMINED 10*3/uL (ref 150–400)
RBC: 4.4 MIL/uL (ref 4.22–5.81)
RDW: 13.5 % (ref 11.5–15.5)
WBC: 8.5 10*3/uL (ref 4.0–10.5)
nRBC: 0 % (ref 0.0–0.2)

## 2019-02-06 LAB — COMPREHENSIVE METABOLIC PANEL
ALT: 17 U/L (ref 0–44)
AST: 22 U/L (ref 15–41)
Albumin: 3 g/dL — ABNORMAL LOW (ref 3.5–5.0)
Alkaline Phosphatase: 76 U/L (ref 38–126)
Anion gap: 10 (ref 5–15)
BUN: 54 mg/dL — ABNORMAL HIGH (ref 8–23)
CO2: 31 mmol/L (ref 22–32)
Calcium: 9.5 mg/dL (ref 8.9–10.3)
Chloride: 103 mmol/L (ref 98–111)
Creatinine, Ser: 1.54 mg/dL — ABNORMAL HIGH (ref 0.61–1.24)
GFR calc Af Amer: 53 mL/min — ABNORMAL LOW (ref >60)
GFR calc non Af Amer: 46 mL/min — ABNORMAL LOW (ref >60)
Glucose, Bld: 111 mg/dL — ABNORMAL HIGH (ref 70–99)
Potassium: 4.4 mmol/L (ref 3.5–5.1)
Sodium: 144 mmol/L (ref 135–145)
Total Bilirubin: 0.4 mg/dL (ref 0.3–1.2)
Total Protein: 7.4 g/dL (ref 6.5–8.1)

## 2019-02-06 MED ORDER — CHLORHEXIDINE GLUCONATE 0.12 % MT SOLN
15.0000 mL | Freq: Two times a day (BID) | OROMUCOSAL | Status: DC
  Administered 2019-02-06 – 2019-02-08 (×5): 15 mL via OROMUCOSAL
  Filled 2019-02-06 (×5): qty 15

## 2019-02-06 MED ORDER — JEVITY 1.2 CAL PO LIQD
1000.0000 mL | ORAL | Status: DC
  Administered 2019-02-07 – 2019-02-08 (×2): 1000 mL
  Filled 2019-02-06 (×3): qty 1000

## 2019-02-06 MED ORDER — ORAL CARE MOUTH RINSE
15.0000 mL | Freq: Two times a day (BID) | OROMUCOSAL | Status: DC
  Administered 2019-02-06 – 2019-02-07 (×4): 15 mL via OROMUCOSAL

## 2019-02-06 MED ORDER — CHLORHEXIDINE GLUCONATE CLOTH 2 % EX PADS
6.0000 | MEDICATED_PAD | Freq: Every day | CUTANEOUS | Status: DC
  Administered 2019-02-06 – 2019-02-08 (×3): 6 via TOPICAL

## 2019-02-06 MED ORDER — JEVITY 1.2 CAL PO LIQD
1000.0000 mL | ORAL | Status: DC
  Filled 2019-02-06: qty 1000

## 2019-02-06 NOTE — Progress Notes (Signed)
Initial Nutrition Assessment  DOCUMENTATION CODES:   Not applicable  INTERVENTION:   Continue tube feeding:  -Jevity 1.2 @ 35 ml/hr via PEG -Increase by 10 ml Q4 hours to goal rate of 60 ml/hr (1440 ml)  At goal TF provides: 1728 kcals, 80 grams protein, 1162 ml free water. Meets 100% of needs.   NUTRITION DIAGNOSIS:   Inadequate oral intake related to inability to eat as evidenced by NPO status.  GOAL:   Patient will meet greater than or equal to 90% of their needs  MONITOR:   Weight trends, Labs, I & O's, TF tolerance, Skin  REASON FOR ASSESSMENT:   Consult Enteral/tube feeding initiation and management  ASSESSMENT:   Patient with PMH significant for ischemic stroke with chronic respiratory insufficieny, s/p trach/PEG, nonverbal, CKD III, HTN, HLD, and depression. Presents this admission with  AKI on CKD.   Pt nonverbal. Spoke with sister via phone. Reports pt's tube feeding regimen consist of Jevity 1.2 @ 60 ml/hr x 24 hrs daily with 100 ml free water flushes Q3 hours (Provides: 1728 kcal, 80 g protein, and 1162 ml free water (1962 ml with flushes). States over the last month she noticed pt would spit up dark brown liquid throughout the day which is suspects is tube feeding. Pt currently receiving Jevity 1.2 without complication. Spoke with RN who reports dark liquid may be from trach. Will continue with home regimen and monitor closely. If issues arise may consider changing to Osmolite 1.2.   Sister endorses pt UBW stays around 165 lb but he has recently gained weight. Records indicate pt weighed 80.9 kg last admission (9/30) and has not had weight taken this admission.   NFPE deferred given pt is on precautions.   I/O: +90 ml since  UOP: 925 ml since admit  Medications: ferrous sulfate, remeron Labs: Cr 1.54- trending up  Diet Order:   Diet Order            Diet NPO time specified Except for: Ice Chips  Diet effective now              EDUCATION NEEDS:    Not appropriate for education at this time  Skin:  Skin Assessment: Reviewed RN Assessment  Last BM:  PTA  Height:   Ht Readings from Last 1 Encounters:  01/14/19 5\' 6"  (1.676 m)    Weight:   Wt Readings from Last 1 Encounters:  01/15/19 80.9 kg    Ideal Body Weight:  64.5 kg  BMI:  There is no height or weight on file to calculate BMI.  Estimated Nutritional Needs:   Kcal:  1650-1850 kcal  Protein:  80-95 grams  Fluid:  >/= 1.6 L/day   Mariana Single RD, LDN Clinical Nutrition Pager # - [phone]

## 2019-02-06 NOTE — Progress Notes (Addendum)
[facility] PROGRESS NOTE  Scott HAEGER [id number] DOB: 1950-06-20 DOA: 02/04/2019 PCP: [facility]  Assessment/Plan: Acute on chronic stage II kidney injury: improving this am. Creatine trending downward.  Suspect prerenal from hypovolemia in setting of reported emesis decreased feeding over the last day and a half. Renal ultrasound reveals left renal atrophy. Foley catheter in place,patient is making adequate urine output. Urine sodium and urine osmolality within limits of normal. Serum osmolality 318.  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 PEG tube in place. has tolerated continuous feeds since admission. Current feeding at lower rate. Will increase rate incrementatlly as recommended.. -Will continue feeding supplement as tolerated -Consult to nutrition  Chronic indwelling Foley catheter: Continue Foley catheter.  Leukocytosis: likely reactive. Resolved this am. He is afebrile and non-toxic appearing. No obvious infection, urinalysis is at baseline.   History of CVA: Chronically bedbound, nonverbal, trach, PEG, Foley dependent as above. -Continue Plavix and statin  Hypertension: controlled Continue amlodipine, Lopressor.  Hyperlipidemia Continue pravastatin.  Depression: Continue mirtazapine.    Code Status: full Family Communication: sister Disposition Plan: considering snf. Sister resistant and patient seems agreeable   Consultants:    Procedures:    Antibiotics:    HPI/Subjective: Awake alert smiling and waving. Denies pain/discomfort by nodding.   Objective: Vitals:   02/06/19 0927  02/06/19 1121  BP: 129/76   Pulse: 74 71  Resp: 14 18  Temp:    SpO2: 97% 94%    Intake/Output Summary (Last 24 hours) at 02/06/2019 1223 Last data filed at 02/06/2019 0900 Gross per 24 hour  Intake 1514.83 ml  Output 1125 ml  Net 389.83 ml   There were no vitals filed for this visit.  Exam:   General:  Thin frail chronically ill  Cardiovascular: rrr no mgr 1+ le edema  Respiratory: normal effort trach intact. BS clear  Abdomen: non-distended non-tender +BS peg site clean and dry  Musculoskeletal: joints with arthritic changes. No erythema/swelling   Data Reviewed: Basic Metabolic Panel: Recent Labs  Lab 02/04/19 1740 02/05/19 0323 02/05/19 1521 02/06/19 0736  NA 142 142 141 144  K 4.9 5.2* 4.3 4.4  CL 96* 99 97* 103  CO2 28 25 34* 31  GLUCOSE 92 83 94 111*  BUN 53* 61* 70* 54*  CREATININE 3.08* 3.00* 2.91* 1.54*  CALCIUM 10.3 9.8 9.8 9.5   Liver Function Tests: Recent Labs  Lab 02/04/19 1740 02/05/19 1521 02/06/19 0736  AST 28 19 22   ALT 11 17 17   ALKPHOS 93 85 76  BILITOT 0.6 0.7 0.4  PROT 9.1* 8.2* 7.4  ALBUMIN 3.8 3.4* 3.0*   No results for input(s): LIPASE, AMYLASE in the last 168 hours. No results for input(s): AMMONIA in the last 168 hours. CBC: Recent Labs  Lab 02/04/19 1740 02/05/19 0600 02/06/19 0736  WBC 16.9* 16.4* 8.5  NEUTROABS 11.8*  --   --   HGB 14.7 14.0 12.9*  HCT 49.7 45.8 42.4  MCV 97.6 97.0 96.4  PLT 463* 440* PLATELET CLUMPS NOTED ON SMEAR, UNABLE TO ESTIMATE   Cardiac Enzymes: No results for input(s): CKTOTAL, CKMB, CKMBINDEX, TROPONINI in the last 168 hours. BNP (last 3 results) No results for input(s): BNP in the last 8760 hours.  ProBNP (last 3  results) No results for input(s): PROBNP in the last 8760 hours.  CBG: No results for input(s): GLUCAP in the last 168 hours.  Recent Results (from the past 240 hour(s))  SARS CORONAVIRUS 2 (TAT 6-24 HRS) Nasopharyngeal Nasopharyngeal Swab     Status: None    Collection Time: 02/04/19  5:40 PM   Specimen: Nasopharyngeal Swab  Result Value Ref Range Status   SARS Coronavirus 2 NEGATIVE NEGATIVE Final    Comment: (NOTE) SARS-CoV-2 target nucleic acids are NOT DETECTED. The SARS-CoV-2 RNA is generally detectable in upper and lower respiratory specimens during the acute phase of infection. Negative results do not preclude SARS-CoV-2 infection, do not rule out co-infections with other pathogens, and should not be used as the sole basis for treatment or other patient management decisions. Negative results must be combined with clinical observations, patient history, and epidemiological information. The expected result is Negative. Fact Sheet for Patients: SugarRoll.be Fact Sheet for Healthcare Providers: https://www.woods-mathews.com/ This test is not yet approved or cleared by the [place] FDA and  has been authorized for detection and/or diagnosis of SARS-CoV-2 by FDA under an Emergency Use Authorization (EUA). This EUA will remain  in effect (meaning this test can be used) for the duration of the COVID-19 declaration under Section 56 4(b)(1) of the Act, 21 U.S.C. section 360bbb-3(b)(1), unless the authorization is terminated or revoked sooner. Performed at [facility], [place] [address], [place], [place] [address]      Studies: US Renal  Result Date: 02/04/2019 CLINICAL DATA:  Acute renal insufficiency. EXAM: RENAL / URINARY TRACT ULTRASOUND COMPLETE COMPARISON:  11/19/2018 CT.  10/16/2016 renal ultrasound. FINDINGS: Right Kidney: Renal measurements: 9.4 x 5.9 x 5.8 cm = volume: 168 mL . Echogenicity within normal limits. No mass or hydronephrosis visualized. Left Kidney: Renal measurements: Severely atrophic on CT.  Not visualized today. Bladder: Decompressed around a Foley catheter. Other: None. IMPRESSION: Marked left renal atrophy.  Normal right kidney. Electronically Signed   By: Abigail Miyamoto M.D.   On: 02/04/2019 20:39   Dg Chest Portable 1 View  Result Date: 02/04/2019 CLINICAL DATA:  Decreased oxygen saturation, history of tracheostomy. EXAM: PORTABLE CHEST 1 VIEW COMPARISON:  01/21/2019 FINDINGS: Tracheostomy tube remains in place tip in the proximal to mid trachea. Cardiomediastinal contours are stable. Lungs are clear. Signs of aortic atherosclerosis. No acute bone process. IMPRESSION: No interval change in the appearance of the tracheostomy tube. No acute abnormality. Electronically Signed   By: Zetta Bills M.D.   On: 02/04/2019 16:44    Scheduled Meds: . acetaminophen  1,000 mg Per Tube TID  . amLODipine  10 mg Per Tube Daily  . chlorhexidine  15 mL Mouth Rinse BID  . Chlorhexidine Gluconate Cloth  6 each Topical Daily  . clopidogrel  75 mg Per Tube Daily  . [START ON 02/07/2019] feeding supplement (JEVITY 1.2 CAL)  1,000 mL Per Tube Q24H  . ferrous sulfate  300 mg Per Tube Daily  . fluconazole  200 mg Oral Daily  . gabapentin  100 mg Oral TID  . glycopyrrolate  1 mg Per Tube BID  . heparin  5,000 Units Subcutaneous Q8H  . mouth rinse  15 mL Mouth Rinse q12n4p  . metoprolol tartrate  50 mg Oral BID  . mirtazapine  7.5 mg Oral QHS  . montelukast  10 mg Per Tube Daily  . pantoprazole sodium  40 mg Per Tube BID  . pravastatin  20 mg Per Tube q1800  Continuous Infusions:  Principal Problem:   Acute-on-chronic kidney injury ([place]) Active Problems:   Essential hypertension   Tracheostomy tube present ([facility])   Chronic respiratory insufficiency   Dysphagia, post-stroke   Foley catheter in place on admission   Gastrostomy tube dependent ([place])   Left renal atrophy   History of cerebral infarction    Time spent: 57 minutes    [place] NP  [facility]  If 7PM-7AM, please contact night-coverage at [email or web address], password [facility] 02/06/2019, 12:23 PM  LOS: 1 day

## 2019-02-06 NOTE — Progress Notes (Signed)
Patient refused blood draw. Flailing arms around. Will try later.

## 2019-02-07 ENCOUNTER — Inpatient Hospital Stay (HOSPITAL_COMMUNITY): Payer: Medicare HMO

## 2019-02-07 DIAGNOSIS — I69391 Dysphagia following cerebral infarction: Secondary | ICD-10-CM

## 2019-02-07 NOTE — Progress Notes (Signed)
CSW acknowledges consult for nursing home placement. The patient is at his baseline. Please consult PT/OT to evaluate him for SNF. CSW will assist with disposition planning based off therapy recommendations.   CSW will continue to follow.   Domenic Schwab, MSW, [place] Worker [facility]  [phone]

## 2019-02-07 NOTE — Progress Notes (Signed)
PROGRESS NOTE    Scott Rivera  [id number] DOB: 1950-04-27 DOA: 02/04/2019 [facility]CP: [facility]    Brief Narrative:  68 y.o. male with medical history significant for history of ischemic stroke with chronic respiratory insufficiency who is tracheostomy dependent, dysphagia and gastrostomy tube dependent, nonverbal, CKD stage II, hypertension, hyperlipidemia, depression, and requiring chronic indwelling Foley catheter presents to the ED for evaluation of nausea and vomiting.  Patient is nonverbal therefore entirety of history is obtained from sister by phone, EDP, chart reviewed.  Patient lives with his sister who is also his caretaker.  She states that at home he has been having nausea and vomiting ongoing over the last month which she feels is related to his tube feeds.  She says that when he has been in the hospital in the past he has not had any issues with this but as soon as he returns home he has vomiting with supplements via tube feed.  She attempted to hold feeds and administered only 100 cc water every 3 hours through the PEG tube over the last day and a half.  Patient is nonverbal at baseline and essentially bedbound but family does get him up in the chair at home.  ED Course:  Initial vitals showed BP 134/79, pulse 100, RR 15, temp 99.1 Fahrenheit, SPO2 93% on 10 L 40% FiO2 via trach collar.  Labs notable for BUN 53, creatinine 3.08, sodium 142, potassium 4.9, bicarb 28, serum glucose 92, WBC 16.9, hemoglobin 14.7, platelets 463,000.  SARS-CoV-2 test was neg  Portable chest x-ray showed tracheostomy tube in place and no focal consolidation, edema, or effusion.  Renal ultrasound was ordered and pending.  Patient was given 1 L normal saline and feeding supplement via G-tube which was reportedly tolerated well.  The hospitalist service was consulted to admit for further evaluation and management of AKI.  Assessment & Plan:   Principal Problem:  Acute-on-chronic kidney injury ([place]) Active Problems:   Essential hypertension   History of cerebral infarction   Dysphagia, post-stroke   Foley catheter in place on admission   Tracheostomy tube present ([place])   Gastrostomy tube dependent ([employer])   Chronic respiratory insufficiency   Left renal atrophy  Acute on chronic stage II kidney injury: -Suspect prerenal from hypovolemia in setting of reported emesis decreased feeding over the last day and a half. Suspect related to marked constipation -improvement with IVF hydration -Renal ultrasound obtained, findings of marked L renal atrophy -Foley catheter in place, stable  Tracheostomy dependent in setting of chronic respiratory insufficiency: Is having increased secretions.  Chest x-ray not suggestive of pneumonia/pneumonitis. -Continue tracheostomy care with suctioning as needed -Continue Robinul as tolerated  Gastrostomy tube dependent due to post stroke dysphagia: PEG tube in place.  He was given feeding supplement in the ED and appeared to tolerate well. -Will continue feeding supplement as tolerated -Nutrition was consulted  Chronic indwelling Foley catheter: Continue Foley catheter as tolerated  Leukocytosis: -Possibly reactive or from hemoconcentration.   -Resolved.  History of CVA: -Chronically bedbound, nonverbal, trach, PEG, Foley dependent as above. -Continue Plavix and statin  Hypertension: -Continue amlodipine, Lopressor as tolerated  Hyperlipidemia Continue pravastatin.  Depression: Continue mirtazapine as tolerated  Constipation -No stool output appreciated since admit -Ordered and personally reviewed abd xray. Very large amounts of stool throughout colon -Very good results with soap suds enema  DVT prophylaxis: Heparin subq Code Status: Full Family Communication: Pt in room, family not at bedside Disposition Plan: Uncertain at this time  Consultants:     Procedures:      Antimicrobials: Anti-infectives (From admission, onward)   Start     Dose/Rate Route Frequency Ordered Stop   02/05/19 1000  fluconazole (DIFLUCAN) tablet 200 mg     200 mg Oral Daily 02/04/19 2035         Subjective: Unable to determine as pt has a trach  Objective: Vitals:   02/07/19 1118 02/07/19 1523 02/07/19 1729 02/07/19 1730  BP:   (!) 152/74   Pulse: 78 84 (!) 156 71  Resp: 18 18 18    Temp:   (!) 97.4 F (36.3 C)   TempSrc:   Oral   SpO2: 93% 93% 97% 96%    Intake/Output Summary (Last 24 hours) at 02/07/2019 1750 Last data filed at 02/07/2019 1300 Gross per 24 hour  Intake 0 ml  Output 1300 ml  Net -1300 ml   There were no vitals filed for this visit.  Examination:  General exam: Appears calm and comfortable  Respiratory system: Clear to auscultation. Respiratory effort normal. Trach in place Cardiovascular system: S1 & S2 heard, RRR Gastrointestinal system: nondistended, decreased BS Central nervous system: Alert . No focal neurological deficits. Extremities: Symmetric 5 x 5 power. Skin: No rashes, lesions Psychiatry:Unable to assess as pt unable to speak at this time.   Data Reviewed: I have personally reviewed following labs and imaging studies  CBC: Recent Labs  Lab 02/04/19 1740 02/05/19 0600 02/06/19 0736  WBC 16.9* 16.4* 8.5  NEUTROABS 11.8*  --   --   HGB 14.7 14.0 12.9*  HCT 49.7 45.8 42.4  MCV 97.6 97.0 96.4  PLT 463* 440* PLATELET CLUMPS NOTED ON SMEAR, UNABLE TO ESTIMATE   Basic Metabolic Panel: Recent Labs  Lab 02/04/19 1740 02/05/19 0323 02/05/19 1521 02/06/19 0736  NA 142 142 141 144  K 4.9 5.2* 4.3 4.4  CL 96* 99 97* 103  CO2 28 25 34* 31  GLUCOSE 92 83 94 111*  BUN 53* 61* 70* 54*  CREATININE 3.08* 3.00* 2.91* 1.54*  CALCIUM 10.3 9.8 9.8 9.5   GFR: CrCl cannot be calculated (Unknown ideal weight.). Liver Function Tests: Recent Labs  Lab 02/04/19 1740 02/05/19 1521 02/06/19 0736  AST 28 19 22   ALT 11 17 17    ALKPHOS 93 85 76  BILITOT 0.6 0.7 0.4  PROT 9.1* 8.2* 7.4  ALBUMIN 3.8 3.4* 3.0*   No results for input(s): LIPASE, AMYLASE in the last 168 hours. No results for input(s): AMMONIA in the last 168 hours. Coagulation Profile: No results for input(s): INR, PROTIME in the last 168 hours. Cardiac Enzymes: No results for input(s): CKTOTAL, CKMB, CKMBINDEX, TROPONINI in the last 168 hours. BNP (last 3 results) No results for input(s): PROBNP in the last 8760 hours. HbA1C: No results for input(s): HGBA1C in the last 72 hours. CBG: No results for input(s): GLUCAP in the last 168 hours. Lipid Profile: No results for input(s): CHOL, HDL, LDLCALC, TRIG, CHOLHDL, LDLDIRECT in the last 72 hours. Thyroid Function Tests: No results for input(s): TSH, T4TOTAL, FREET4, T3FREE, THYROIDAB in the last 72 hours. Anemia Panel: No results for input(s): VITAMINB12, FOLATE, FERRITIN, TIBC, IRON, RETICCTPCT in the last 72 hours. Sepsis Labs: No results for input(s): PROCALCITON, LATICACIDVEN in the last 168 hours.  Recent Results (from the past 240 hour(s))  SARS CORONAVIRUS 2 (TAT 6-24 HRS) Nasopharyngeal Nasopharyngeal Swab     Status: None   Collection Time: 02/04/19  5:40 PM   Specimen: Nasopharyngeal Swab  Result Value Ref Range Status   SARS Coronavirus 2 NEGATIVE NEGATIVE Final    Comment: (NOTE) SARS-CoV-2 target nucleic acids are NOT DETECTED. The SARS-CoV-2 RNA is generally detectable in upper and lower respiratory specimens during the acute phase of infection. Negative results do not preclude SARS-CoV-2 infection, do not rule out co-infections with other pathogens, and should not be used as the sole basis for treatment or other patient management decisions. Negative results must be combined with clinical observations, patient history, and epidemiological information. The expected result is Negative. Fact Sheet for Patients: SugarRoll.be Fact Sheet for  Healthcare Providers: https://www.woods-mathews.com/ This test is not yet approved or cleared by the [place] FDA and  has been authorized for detection and/or diagnosis of SARS-CoV-2 by FDA under an Emergency Use Authorization (EUA). This EUA will remain  in effect (meaning this test can be used) for the duration of the COVID-19 declaration under Section 56 4(b)(1) of the Act, 21 U.S.C. section 360bbb-3(b)(1), unless the authorization is terminated or revoked sooner. Performed at [facility], [place] [address] [place], [place] [address]      Radiology Studies: Dg Abd Portable 1v  Result Date: 02/07/2019 CLINICAL DATA:  Constipation EXAM: PORTABLE ABDOMEN - 1 VIEW COMPARISON:  None. FINDINGS: There is a large amount of stool throughout the colon. There is no bowel dilatation to suggest obstruction. There is no evidence of pneumoperitoneum, portal venous gas or pneumatosis. There are no pathologic calcifications along the expected course of the ureters. 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 There is moderate osteoarthritis of the left hip. 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 IMPRESSION: Large amount of stool throughout the colon. Electronically Signed   By: Kathreen Devoid   On: 02/07/2019 10:56    Scheduled Meds: . acetaminophen  1,000 mg Per Tube TID  . amLODipine  10 mg Per Tube Daily  . chlorhexidine  15 mL Mouth Rinse BID  . Chlorhexidine Gluconate Cloth  6 each Topical Daily  . clopidogrel  75 mg Per Tube Daily  . feeding supplement (JEVITY 1.2 CAL)  1,000 mL Per Tube Q24H  . ferrous sulfate  300 mg Per Tube Daily  . fluconazole  200 mg Oral Daily  . gabapentin  100 mg Oral TID  . glycopyrrolate  1 mg Per Tube BID  . heparin  5,000 Units Subcutaneous Q8H  . mouth rinse  15 mL Mouth Rinse q12n4p  . metoprolol tartrate  50 mg Oral BID  . mirtazapine  7.5 mg Oral QHS  . montelukast  10  mg Per Tube Daily  . pantoprazole sodium  40 mg Per Tube BID  . pravastatin  20 mg Per Tube q1800   Continuous Infusions:   LOS: 2 days   Marylu Lund, MD [facility] Pager On Amion  If 7PM-7AM, please contact night-coverage 02/07/2019, 5:50 PM

## 2019-02-07 NOTE — Care Management Important Message (Signed)
Important Message  Patient Details  Name: Scott Rivera MRN: [id number] Date of Birth: [birth year]   Medicare Important Message Given:  No   Precaution in place no IM given  Orbie Pyo 02/07/2019, 2:54 PM

## 2019-02-08 DIAGNOSIS — Z978 Presence of other specified devices: Secondary | ICD-10-CM

## 2019-02-08 DIAGNOSIS — N179 Acute kidney failure, unspecified: Secondary | ICD-10-CM | POA: Diagnosis not present

## 2019-02-08 DIAGNOSIS — R0689 Other abnormalities of breathing: Secondary | ICD-10-CM | POA: Diagnosis not present

## 2019-02-08 LAB — COMPREHENSIVE METABOLIC PANEL
ALT: 19 U/L (ref 0–44)
AST: 22 U/L (ref 15–41)
Albumin: 3.4 g/dL — ABNORMAL LOW (ref 3.5–5.0)
Alkaline Phosphatase: 96 U/L (ref 38–126)
Anion gap: 11 (ref 5–15)
BUN: 28 mg/dL — ABNORMAL HIGH (ref 8–23)
CO2: 26 mmol/L (ref 22–32)
Calcium: 10.2 mg/dL (ref 8.9–10.3)
Chloride: 106 mmol/L (ref 98–111)
Creatinine, Ser: 1.19 mg/dL (ref 0.61–1.24)
GFR calc Af Amer: >60 mL/min (ref >60)
GFR calc non Af Amer: >60 mL/min (ref >60)
Glucose, Bld: 109 mg/dL — ABNORMAL HIGH (ref 70–99)
Potassium: 4.5 mmol/L (ref 3.5–5.1)
Sodium: 143 mmol/L (ref 135–145)
Total Bilirubin: 0.3 mg/dL (ref 0.3–1.2)
Total Protein: 8.1 g/dL (ref 6.5–8.1)

## 2019-02-08 LAB — CBC
HCT: 44.2 % (ref 39.0–52.0)
Hemoglobin: 13.5 g/dL (ref 13.0–17.0)
MCH: 29.6 pg (ref 26.0–34.0)
MCHC: 30.5 g/dL (ref 30.0–36.0)
MCV: 96.9 fL (ref 80.0–100.0)
Platelets: 352 10*3/uL (ref 150–400)
RBC: 4.56 MIL/uL (ref 4.22–5.81)
RDW: 13.3 % (ref 11.5–15.5)
WBC: 7.7 10*3/uL (ref 4.0–10.5)
nRBC: 0 % (ref 0.0–0.2)

## 2019-02-08 MED ORDER — DOCUSATE SODIUM 100 MG PO CAPS: 100.0000 mg | ORAL_CAPSULE | Freq: Two times a day (BID) | ORAL | 0 refills | Status: AC

## 2019-02-08 MED ORDER — LACTULOSE 20 GM/30ML PO SOLN: 20.0000 g | Freq: Every day | ORAL | 0 refills | Status: AC | PRN

## 2019-02-08 NOTE — Evaluation (Signed)
Physical Therapy Evaluation & Discharge Patient Details Name: Scott Rivera MRN: [id number] DOB: Aug 03, 1950 Today's Date: 02/08/2019   History of Present Illness  Pt is a 68 y.o. male who is trach/PEG-dependent, admitted 02/04/19 with nausea vomiting; found to have AKI on CKD III. Other PMH includes CVA (10/2016 with resultant dysphagia, trach/PEG), HTN, aspiration PNA.    Clinical Impression  Patient evaluated by Physical Therapy with no further acute PT needs identified. PTA, pt lives family and dependent for mobility/ADL tasks. Today, pt transferred to recliner with maximove lift, tolerated great. SpO2 88% on 5L O2 28% FiO2 via trach. All education has been completed and the patient has no further questions. Pt functioning at baseline; pt agrees he could benefit from hoyer lift at home since family lifts him for transfers. Acute PT is signing off. Thank you for this referral.    Follow Up Recommendations Supervision/Assistance - 24 hour(SNF/LTC vs. return home with continued family assist)    Equipment Recommendations  Other (comment)(hoyer lift, hospital bed)    Recommendations for Other Services       Precautions / Restrictions Precautions Precautions: Fall Precaution Comments: Chronic trach/PEG Restrictions Weight Bearing Restrictions: No      Mobility  Bed Mobility Overal bed mobility: Needs Assistance Bed Mobility: Rolling Rolling: Mod assist;+2 for safety/equipment         General bed mobility comments: ModA to roll R/L for pericare pad placement, pt able to assist with BUE rail support and lifting BLEs  Transfers Overall transfer level: Needs assistance               General transfer comment: TotalA to perform transfer to recliner with maximove lift, pt tolerated very well  Ambulation/Gait                Stairs            Wheelchair Mobility    Modified Rankin (Stroke Patients Only)       Balance Overall balance assessment: Needs  assistance   Sitting balance-Leahy Scale: Poor Sitting balance - Comments: Required UE support and assist to maintain static sitting in recliner without back support                                     Pertinent Vitals/Pain Pain Assessment: No/denies pain    Home Living Family/patient expects to be discharged to:: Private residence Living Arrangements: Other relatives(sister) Available Help at Discharge: Family;Available 24 hours/day                  Prior Function Level of Independence: Needs assistance         Comments: Per chart, prior to stroke in 10/2016, ambulatory with [facility] and living alone. Pt now lives with sister who is caregiver; primarily stays in bed, family physically lifts to chair and assists with all ADLs     Hand Dominance        Extremity/Trunk Assessment   Upper Extremity Assessment Upper Extremity Assessment: RUE deficits/detail;LUE deficits/detail(flexion contractures throughout BUEs)    Lower Extremity Assessment Lower Extremity Assessment: RLE deficits/detail;LLE deficits/detail(bilateral knees swollen, flexion contractures; able to flex hips/knees at least ~3/5)    Cervical / Trunk Assessment Cervical / Trunk Assessment: Kyphotic  Communication   Communication: Tracheostomy  Cognition Arousal/Alertness: Awake/alert Behavior During Therapy: WFL for tasks assessed/performed Overall Cognitive Status: Difficult to assess  General Comments: Pt non-verbal. Answers majority of yes/no questions appropriately, follows majority of simple one-step commands      General Comments General comments (skin integrity, edema, etc.): SpO2 down to 88% on vent via trach collar, 5L O2 at 28% FiO2    Exercises     Assessment/Plan    PT Assessment Patent does not need any further PT services  PT Problem List         PT Treatment Interventions      PT Goals (Current goals can be found in  the Care Plan section)  Acute Rehab PT Goals PT Goal Formulation: All assessment and education complete, DC therapy    Frequency     Barriers to discharge        Co-evaluation               AM-PAC PT "6 Clicks" Mobility  Outcome Measure Help needed turning from your back to your side while in a flat bed without using bedrails?: A Lot Help needed moving from lying on your back to sitting on the side of a flat bed without using bedrails?: Total Help needed moving to and from a bed to a chair (including a wheelchair)?: Total Help needed standing up from a chair using your arms (e.g., wheelchair or bedside chair)?: Total Help needed to walk in hospital room?: Total Help needed climbing 3-5 steps with a railing? : Total 6 Click Score: 7    End of Session Equipment Utilized During Treatment: Oxygen Activity Tolerance: Patient tolerated treatment well Patient left: in chair;with call bell/phone within reach;with chair alarm set Nurse Communication: Mobility status;Need for lift equipment PT Visit Diagnosis: Other abnormalities of gait and mobility (R26.89)    Time: [id number] PT Time Calculation (min) (ACUTE ONLY): 32 min   Charges:   PT Evaluation $PT Eval Moderate Complexity: 1 Mod PT Treatments $Therapeutic Activity: 8-22 mins      Mabeline Caras, PT, DPT Acute Rehabilitation Services  Pager [phone] Office [place] 02/08/2019, 2:24 PM

## 2019-02-08 NOTE — TOC Transition Note (Signed)
Transition of Care [facility]) - CM/SW Discharge Note   Patient Details  Name: Scott Rivera MRN: [id number] Date of Birth: 1950-11-30  Transition of Care [facility]) CM/SW Contact:  Gelene Mink, [place] Phone Number: 02/08/2019, 2:51 PM   Clinical Narrative:     Patient will DC to: Home  Anticipated DC date: 02/08/2019 Family notified: Yes Transport by: Corey Harold   Per MD patient ready for DC to . RN, patient, patient's family, and facility notified of DC. DC packet on chart. Ambulance transport requested for patient.   CSW will sign off for now as social work intervention is no longer needed. Please consult [place] again if new needs arise.  Dimitri Dsouza, LCSW-A [place]/Clinical Social Work Department Cell: [phone]      Barriers to Discharge: No Barriers Identified   Patient Goals and CMS Choice Patient states their goals for this hospitalization and ongoing recovery are:: Pt will go home with sister   Choice offered to / list presented to : NA  Discharge Placement                       Discharge Plan and Services In-house Referral: Clinical Social Work   Post Acute Care Choice: NA                               Social Determinants of Health (SDOH) Interventions     Readmission Risk Interventions No flowsheet data found.

## 2019-02-08 NOTE — Plan of Care (Signed)
  Problem: Clinical Measurements: Goal: Ability to maintain clinical measurements within normal limits will improve Outcome: Progressing   

## 2019-02-08 NOTE — Discharge Summary (Signed)
Physician Discharge Summary  Scott Rivera [id number] DOB: 01-23-1951 DOA: 02/04/2019  [facility]P: [facility]  Admit date: 02/04/2019 Discharge date: 02/08/2019  Admitted From: Home Disposition:  Home  Recommendations for Outpatient Follow-up:  1. Follow up with PCP in 1-2 weeks 2. Please re-evaluate need for continued iron, current hgb 13.5  Discharge Condition:Improved CODE STATUS:Full Diet recommendation: Tube feeding   Brief/Interim Summary: 68 y.o.malewith medical history significant forhistory of ischemic stroke with chronic respiratory insufficiency who is tracheostomy dependent, dysphagia and gastrostomy tube dependent, nonverbal, CKD stage II, hypertension, hyperlipidemia, depression, and requiring chronic indwelling Foley catheter presents to the ED for evaluation of nausea and vomiting.Patient is nonverbal therefore entirety of history is obtained from sister by phone, EDP, chart reviewed.  Patient lives with his sister who is also his caretaker. She states that at home he has been having nausea and vomiting ongoing over the last month which she feels is related to his tube feeds. She says that when he has been in the hospital in the past he has not had any issues with this but as soon as he returns home he has vomiting with supplements via tube feed. She attempted to hold feeds and administered only 100 cc water every 3 hours through the PEG tube over the last day and a half.  Patient is nonverbal at baseline and essentially bedbound but family does get him up in the chair at home.  ED Course: Initial vitals showed BP 134/79, pulse 100, RR 15, temp 99.1 Fahrenheit, SPO2 93% on 10 L40% FiO2 via trach collar.  Labs notable for BUN 53, creatinine 3.08, sodium 142, potassium 4.9, bicarb 28, serum glucose 92, WBC 16.9, hemoglobin 14.7, platelets 463,000.  SARS-CoV-2 test was neg  Portable chest x-ray showed tracheostomy tube in place and no focal  consolidation, edema, or effusion. Renal ultrasound was ordered and pending.  Patient was given 1 L normal saline and feeding supplement via G-tube which was reportedly tolerated well. The hospitalist service was consulted to admit for further evaluation and management of AKI.   Discharge Diagnoses:  Principal Problem:   Acute-on-chronic kidney injury ([place]) Active Problems:   Essential hypertension   History of cerebral infarction   Dysphagia, post-stroke   Foley catheter in place on admission   Tracheostomy tube present ([place])   Gastrostomy tube dependent ([place])   Chronic respiratory insufficiency   Left renal atrophy   Acute on chronic stage II kidney injury: -Suspect prerenal from hypovolemia in setting of reported emesis decreased feeding over the last day and a half. Suspect related to marked constipation while on iron supplementation -improvement with IVF hydration -Renal ultrasound obtained, findings of marked L renal atrophy -Foley catheter in place, stable  Tracheostomy dependent in setting of chronic respiratory insufficiency: Is having increased secretions. Chest x-ray not suggestive of pneumonia/pneumonitis. -Continue tracheostomy care with suctioning as needed -Continue Robinul as tolerated  Gastrostomy tube dependent due to post stroke dysphagia: PEG tube in place. He was given feeding supplement in the ED and appeared to tolerate well. -Will continue feeding supplement as tolerated -Nutrition was consulted  Chronic indwelling Foley catheter: Continue Foley catheter as tolerated  Leukocytosis: -Possibly reactive or from hemoconcentration.  -Resolved.  History of CVA: -Chronically bedbound, nonverbal, trach, PEG, Foley dependent as above. -Continue Plavix and statin  Hypertension: -Continue amlodipine, Lopressor as tolerated  Hyperlipidemia Continue pravastatin.  Depression: Continue mirtazapine as tolerated  Constipation -Ordered  and personally reviewed abd xray. Very large amounts of stool throughout colon -  Very good results with soap suds enema -Suspect related to iron supplementation prior to admit -Recommend daily stool softener as well as PRN lactulose in addition to daily miralax pt already has prior to admit -Discussed with patient's sister and recommended PRN OTC enema if needed   Discharge Instructions   Allergies as of 02/08/2019   No Known Allergies     Medication List    TAKE these medications   acetaminophen 500 MG tablet Commonly known as: TYLENOL Place 1,000 mg into feeding tube 3 (three) times daily.   amLODipine 10 MG tablet Commonly known as: NORVASC Place 1 tablet (10 mg total) into feeding tube daily.   aspirin EC 81 MG tablet 81 mg daily. Per Feeding tube   cloNIDine 0.1 mg/24hr patch Commonly known as: CATAPRES - Dosed in mg/24 hr Place 1 patch (0.1 mg total) onto the skin every Tuesday.   clopidogrel 75 MG tablet Commonly known as: PLAVIX Place 1 tablet (75 mg total) into feeding tube daily.   docusate sodium 100 MG capsule Commonly known as: Colace Take 1 capsule (100 mg total) by mouth 2 (two) times daily.   feeding supplement (JEVITY 1.2 CAL) Liqd Place 60 mLs into feeding tube continuous.   fluconazole 100 MG tablet Commonly known as: Diflucan Take 2 tablets (200 mg total) by mouth daily for 28 doses. What changed:   how much to take  how to take this  when to take this   gabapentin 100 MG capsule Commonly known as: NEURONTIN 100 mg 3 (three) times daily. Per Feeding tube   glycopyrrolate 1 MG tablet Commonly known as: ROBINUL Place 1 mg into feeding tube 2 (two) times daily.   Iron 325 (65 Fe) MG Tabs Place 325 mg into feeding tube daily.   Lactulose 20 GM/30ML Soln Place 30 mLs (20 g total) into feeding tube daily as needed (for constipation).   metoprolol tartrate 50 MG tablet Commonly known as: LOPRESSOR Take 1 tablet (50 mg total) by mouth  2 (two) times daily.   mirtazapine 15 MG tablet Commonly known as: REMERON Place 7.5 mg into feeding tube at bedtime.   montelukast 10 MG tablet Commonly known as: SINGULAIR Place 10 mg into feeding tube daily.   pravastatin 20 MG tablet Commonly known as: PRAVACHOL Place 1 tablet (20 mg total) into feeding tube daily at 6 PM. What changed: when to take this   vitamin C 500 MG tablet Commonly known as: ASCORBIC ACID Place 500 mg into feeding tube daily.      Follow-up [place]. Schedule an appointment as soon as possible for a visit in 2 week(s).   Contact information: [place] [phone]          No Known Allergies  Procedures/Studies: US Renal  Result Date: 02/04/2019 CLINICAL DATA:  Acute renal insufficiency. EXAM: RENAL / URINARY TRACT ULTRASOUND COMPLETE COMPARISON:  11/19/2018 CT.  10/16/2016 renal ultrasound. FINDINGS: Right Kidney: Renal measurements: 9.4 x 5.9 x 5.8 cm = volume: 168 mL . Echogenicity within normal limits. No mass or hydronephrosis visualized. Left Kidney: Renal measurements: Severely atrophic on CT.  Not visualized today. Bladder: Decompressed around a Foley catheter. Other: None. IMPRESSION: Marked left renal atrophy.  Normal right kidney. Electronically Signed   By: Abigail Miyamoto M.D.   On: 02/04/2019 20:39   Dg Chest Portable 1 View  Result Date: 02/04/2019 CLINICAL DATA:  Decreased oxygen saturation, history of tracheostomy. EXAM: PORTABLE  CHEST 1 VIEW COMPARISON:  01/21/2019 FINDINGS: Tracheostomy tube remains in place tip in the proximal to mid trachea. Cardiomediastinal contours are stable. Lungs are clear. Signs of aortic atherosclerosis. No acute bone process. IMPRESSION: No interval change in the appearance of the tracheostomy tube. No acute abnormality. Electronically Signed   By: Zetta Bills M.D.   On: 02/04/2019 16:44   Dg Chest Portable 1 View  Result Date:  01/13/2019 CLINICAL DATA:  Aspiration.  Shortness of breath. EXAM: PORTABLE CHEST 1 VIEW COMPARISON:  January 08, 2019 FINDINGS: A tracheostomy tube is stable. The cardiomediastinal silhouette is stable. No pneumothorax. No nodules or masses. Vascular crowding in the medial right lung base is stable. No focal infiltrate. IMPRESSION: No active disease. Electronically Signed   By: Dorise Bullion III M.D   On: 01/13/2019 16:58   Dg Abd Portable 1v  Result Date: 02/07/2019 CLINICAL DATA:  Constipation EXAM: PORTABLE ABDOMEN - 1 VIEW COMPARISON:  None. FINDINGS: There is a large amount of stool throughout the colon. There is no bowel dilatation to suggest obstruction. There is no evidence of pneumoperitoneum, portal venous gas or pneumatosis. There are no pathologic calcifications along the expected course of the ureters. 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 There is moderate osteoarthritis of the left hip. 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 IMPRESSION: Large amount of stool throughout the colon. Electronically Signed   By: Kathreen Devoid   On: 02/07/2019 10:56     Subjective: Without issues  Discharge Exam: Vitals:   02/08/19 0951 02/08/19 1159  BP: 135/84   Pulse: 77 67  Resp: 18 20  Temp: 98.1 F (36.7 C)   SpO2: 97% 96%   Vitals:   02/08/19 0558 02/08/19 0841 02/08/19 0951 02/08/19 1159  BP: (!) 142/90  135/84   Pulse: 76 69 77 67  Resp: 16 16 18 20   Temp: 98.2 F (36.8 C)  98.1 F (36.7 C)   TempSrc:   Oral   SpO2: 94% 95% 97% 96%    General: Pt is alert, awake, not in acute distress Cardiovascular: RRR, S1/S2 +, no rubs, no gallops Respiratory: CTA bilaterally, no wheezing, no rhonchi Abdominal: Soft, NT, ND, bowel sounds + Extremities: no edema, no cyanosis   The results of significant diagnostics from this hospitalization (including imaging, microbiology, ancillary and laboratory) are listed  below for reference.     Microbiology: Recent Results (from the past 240 hour(s))  SARS CORONAVIRUS 2 (TAT 6-24 HRS) Nasopharyngeal Nasopharyngeal Swab     Status: None   Collection Time: 02/04/19  5:40 PM   Specimen: Nasopharyngeal Swab  Result Value Ref Range Status   SARS Coronavirus 2 NEGATIVE NEGATIVE Final    Comment: (NOTE) SARS-CoV-2 target nucleic acids are NOT DETECTED. The SARS-CoV-2 RNA is generally detectable in upper and lower respiratory specimens during the acute phase of infection. Negative results do not preclude SARS-CoV-2 infection, do not rule out co-infections with other pathogens, and should not be used as the sole basis for treatment or other patient management decisions. Negative results must be combined with clinical observations, patient history, and epidemiological information. The expected result is Negative. Fact Sheet for Patients: SugarRoll.be Fact Sheet for Healthcare Providers: https://www.woods-mathews.com/ This test is not yet approved or cleared by the [place] FDA and  has been authorized for detection and/or diagnosis of SARS-CoV-2 by FDA under an Emergency Use Authorization (EUA). This EUA will remain  in effect (meaning this  test can be used) for the duration of the COVID-19 declaration under Section 56 4(b)(1) of the Act, 21 U.S.C. section 360bbb-3(b)(1), unless the authorization is terminated or revoked sooner. Performed at [facility], [place] [address] [place], [place] [address]      Labs: BNP (last 3 results) No results for input(s): BNP in the last 8760 hours. Basic Metabolic Panel: Recent Labs  Lab 02/04/19 1740 02/05/19 0323 02/05/19 1521 02/06/19 0736 02/08/19 0607  NA 142 142 141 144 143  K 4.9 5.2* 4.3 4.4 4.5  CL 96* 99 97* 103 106  CO2 28 25 34* 31 26  GLUCOSE 92 83 94 111* 109*  BUN 53* 61* 70* 54* 28*  CREATININE 3.08* 3.00* 2.91* 1.54* 1.19  CALCIUM 10.3  9.8 9.8 9.5 10.2   Liver Function Tests: Recent Labs  Lab 02/04/19 1740 02/05/19 1521 02/06/19 0736 02/08/19 0607  AST 28 19 22 22   ALT 11 17 17 19   ALKPHOS 93 85 76 96  BILITOT 0.6 0.7 0.4 0.3  PROT 9.1* 8.2* 7.4 8.1  ALBUMIN 3.8 3.4* 3.0* 3.4*   No results for input(s): LIPASE, AMYLASE in the last 168 hours. No results for input(s): AMMONIA in the last 168 hours. CBC: Recent Labs  Lab 02/04/19 1740 02/05/19 0600 02/06/19 0736 02/08/19 0607  WBC 16.9* 16.4* 8.5 7.7  NEUTROABS 11.8*  --   --   --   HGB 14.7 14.0 12.9* 13.5  HCT 49.7 45.8 42.4 44.2  MCV 97.6 97.0 96.4 96.9  PLT 463* 440* PLATELET CLUMPS NOTED ON SMEAR, UNABLE TO ESTIMATE 352   Cardiac Enzymes: No results for input(s): CKTOTAL, CKMB, CKMBINDEX, TROPONINI in the last 168 hours. BNP: Invalid input(s): POCBNP CBG: No results for input(s): GLUCAP in the last 168 hours. D-Dimer No results for input(s): DDIMER in the last 72 hours. Hgb A1c No results for input(s): HGBA1C in the last 72 hours. Lipid Profile No results for input(s): CHOL, HDL, LDLCALC, TRIG, CHOLHDL, LDLDIRECT in the last 72 hours. Thyroid function studies No results for input(s): TSH, T4TOTAL, T3FREE, THYROIDAB in the last 72 hours.  Invalid input(s): FREET3 Anemia work up No results for input(s): VITAMINB12, FOLATE, FERRITIN, TIBC, IRON, RETICCTPCT in the last 72 hours. Urinalysis    Component Value Date/Time   COLORURINE AMBER (A) 02/04/2019 2314   APPEARANCEUR CLOUDY (A) 02/04/2019 2314   LABSPEC 1.017 02/04/2019 2314   PHURINE 5.0 02/04/2019 2314   GLUCOSEU NEGATIVE 02/04/2019 2314   HGBUR MODERATE (A) 02/04/2019 2314   BILIRUBINUR NEGATIVE 02/04/2019 [place] 02/04/2019 2314   PROTEINUR 100 (A) 02/04/2019 2314   NITRITE NEGATIVE 02/04/2019 2314   LEUKOCYTESUR LARGE (A) 02/04/2019 2314   Sepsis Labs Invalid input(s): PROCALCITONIN,  WBC,  LACTICIDVEN Microbiology Recent Results (from the past 240  hour(s))  SARS CORONAVIRUS 2 (TAT 6-24 HRS) Nasopharyngeal Nasopharyngeal Swab     Status: None   Collection Time: 02/04/19  5:40 PM   Specimen: Nasopharyngeal Swab  Result Value Ref Range Status   SARS Coronavirus 2 NEGATIVE NEGATIVE Final    Comment: (NOTE) SARS-CoV-2 target nucleic acids are NOT DETECTED. The SARS-CoV-2 RNA is generally detectable in upper and lower respiratory specimens during the acute phase of infection. Negative results do not preclude SARS-CoV-2 infection, do not rule out co-infections with other pathogens, and should not be used as the sole basis for treatment or other patient management decisions. Negative results must be combined with clinical observations, patient history, and epidemiological information.  The expected result is Negative. Fact Sheet for Patients: SugarRoll.be Fact Sheet for Healthcare Providers: https://www.woods-mathews.com/ This test is not yet approved or cleared by the [place] FDA and  has been authorized for detection and/or diagnosis of SARS-CoV-2 by FDA under an Emergency Use Authorization (EUA). This EUA will remain  in effect (meaning this test can be used) for the duration of the COVID-19 declaration under Section 56 4(b)(1) of the Act, 21 U.S.C. section 360bbb-3(b)(1), unless the authorization is terminated or revoked sooner. Performed at [facility], [place] [address], [place], [place] [address]    Time spent: 30 min  SIGNED:   Marylu Lund, MD  [facility] 02/08/2019, 2:46 PM  If 7PM-7AM, please contact night-coverage

## 2019-02-08 NOTE — Plan of Care (Signed)
  Problem: Health Behavior/Discharge Planning: Goal: Ability to manage health-related needs will improve Outcome: Progressing   

## 2019-02-08 NOTE — Plan of Care (Signed)
  Problem: Education: Goal: Knowledge of General Education information will improve Description: Including pain rating scale, medication(s)/side effects and non-pharmacologic comfort measures Outcome: Adequate for Discharge   Problem: Health Behavior/Discharge Planning: Goal: Ability to manage health-related needs will improve 02/08/2019 1905 by Baldo Ash, RN Outcome: Adequate for Discharge 02/08/2019 1028 by Baldo Ash, RN Outcome: Progressing   Problem: Clinical Measurements: Goal: Ability to maintain clinical measurements within normal limits will improve 02/08/2019 1905 by Baldo Ash, RN Outcome: Adequate for Discharge 02/08/2019 0831 by Baldo Ash, RN Outcome: Progressing Goal: Will remain free from infection Outcome: Adequate for Discharge Goal: Diagnostic test results will improve Outcome: Adequate for Discharge Goal: Respiratory complications will improve Outcome: Adequate for Discharge Goal: Cardiovascular complication will be avoided Outcome: Adequate for Discharge   Problem: Activity: Goal: Risk for activity intolerance will decrease Outcome: Adequate for Discharge   Problem: Nutrition: Goal: Adequate nutrition will be maintained Outcome: Adequate for Discharge   Problem: Coping: Goal: Level of anxiety will decrease Outcome: Adequate for Discharge   Problem: Elimination: Goal: Will not experience complications related to bowel motility Outcome: Adequate for Discharge Goal: Will not experience complications related to urinary retention Outcome: Adequate for Discharge   Problem: Pain Managment: Goal: General experience of comfort will improve Outcome: Adequate for Discharge   Problem: Safety: Goal: Ability to remain free from injury will improve Outcome: Adequate for Discharge   Problem: Skin Integrity: Goal: Risk for impaired skin integrity will decrease Outcome: Adequate for Discharge   Problem: Inadequate Intake  (NI-2.1) Goal: Food and/or nutrient delivery Description: Individualized approach for food/nutrient provision. Outcome: Adequate for Discharge

## 2019-02-08 NOTE — Progress Notes (Signed)
Scott Rivera to be discharged home per MD order. Discussed prescriptions and follow up appointments with the patient. Prescriptions given to patient; medication list explained in detail. Patient verbalized understanding.  Skin clean, dry and intact without evidence of skin break down, no evidence of skin tears noted. IV catheter discontinued intact. Site without signs and symptoms of complications. Dressing and pressure applied. Pt denies pain at the site currently. No complaints noted.  Patient free of lines, drains, and wounds.   An After Visit Summary (AVS) was printed and given to the patient. Patient escorted via [facility], and discharged home via private auto.  Baldo Ash, RN

## 2019-03-15 IMAGING — DX DG ABDOMEN 1V
1 series · 1 of 1 positions shown · non-contrast
Comparison: 12/21/2017

CLINICAL DATA: G-tube placement

EXAM:
ABDOMEN - 1 VIEW

[abdomen]
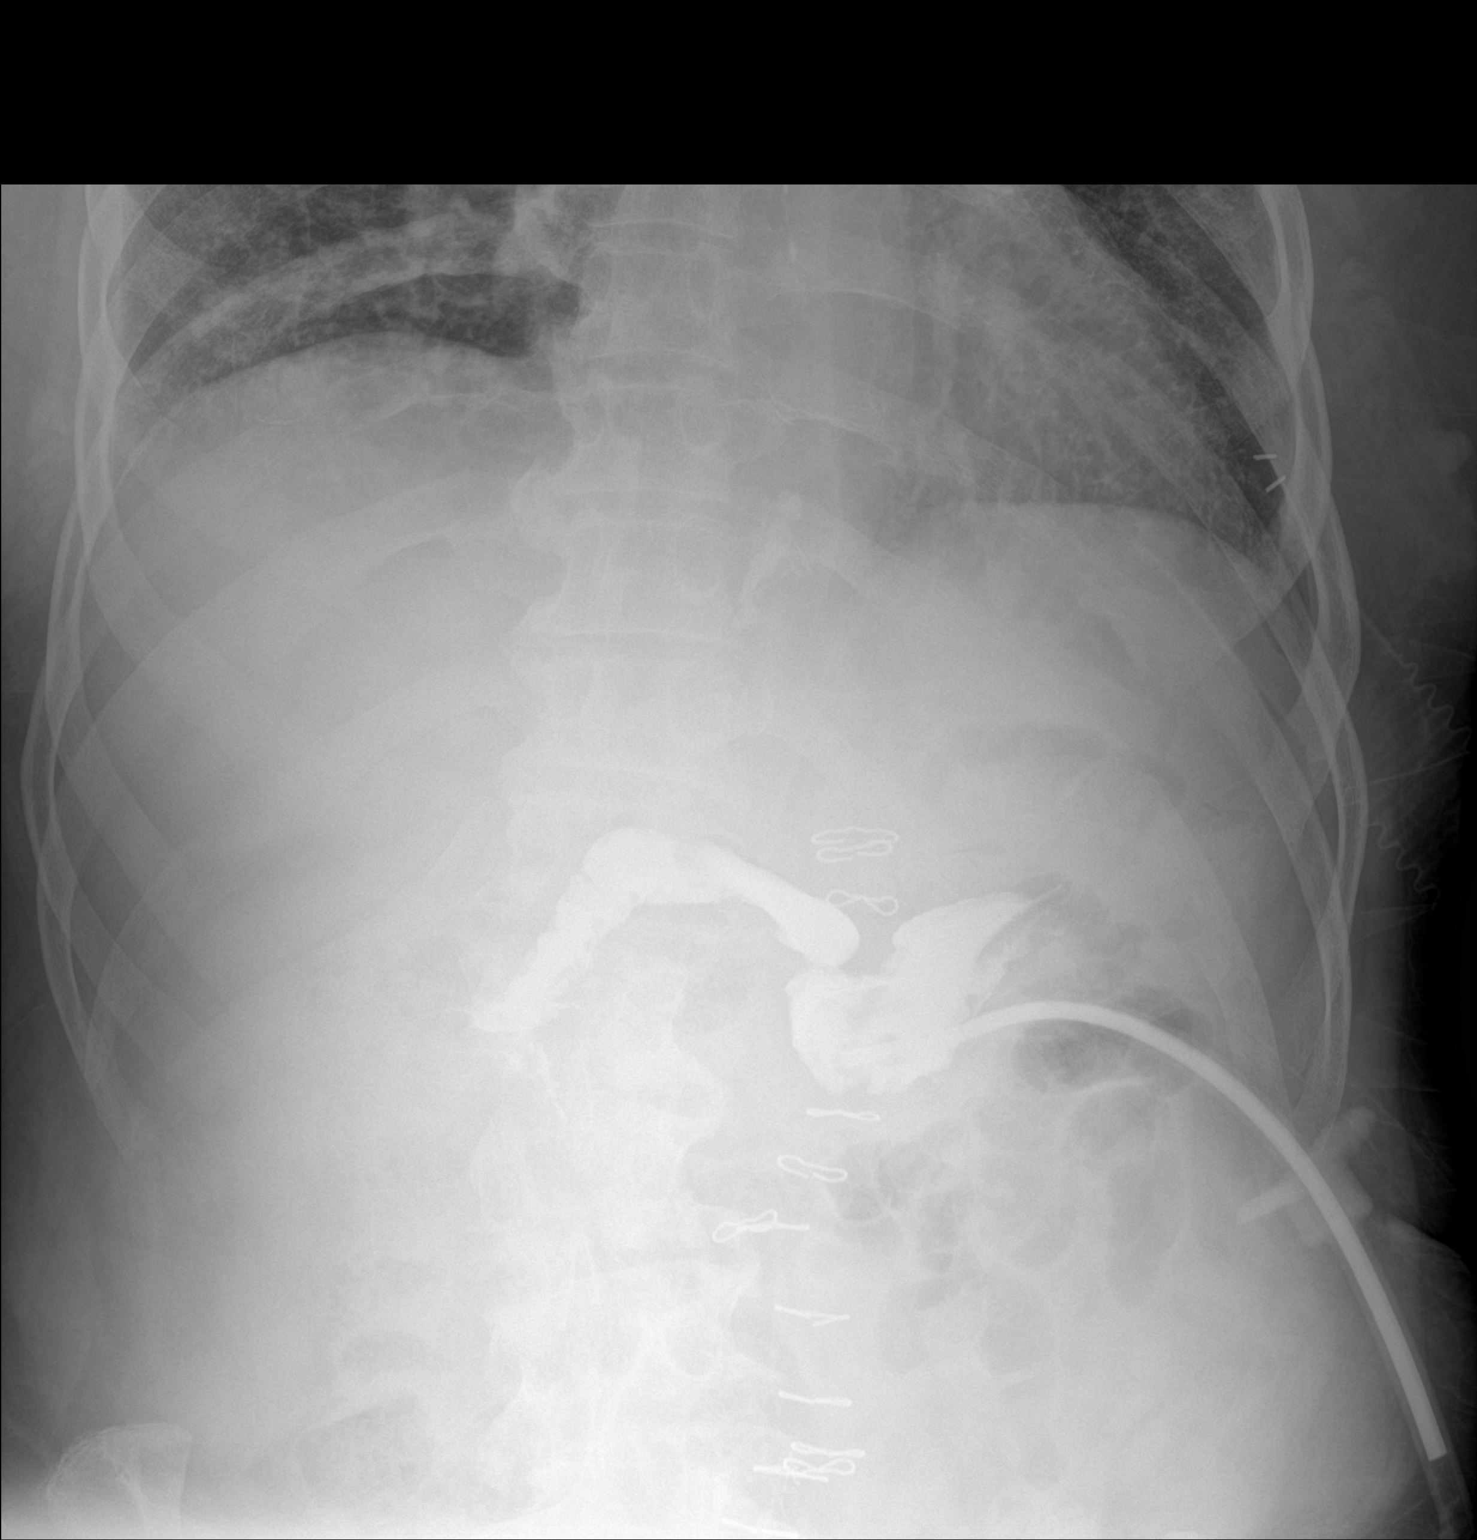

[1 of 1 positions shown; findings below may reference images not displayed]

FINDINGS: Gastrostomy tube projects over the left abdomen. Injected contrast
opacifies the distal stomach and proximal duodenum. No contrast
extravasation. No evidence of bowel obstruction.
IMPRESSION: Gastrostomy tube in the distal stomach.  No contrast extravasation.

## 2019-05-19 IMAGING — DX DG CHEST 2V
2 series · 2 of 2 positions shown · non-contrast
Comparison: Chest radiograph January 01, 2018 and chest CT
January 29, 2018

CLINICAL DATA: Rhonchi.  Vomiting.

EXAM:
CHEST - 2 VIEW

[chest lat]
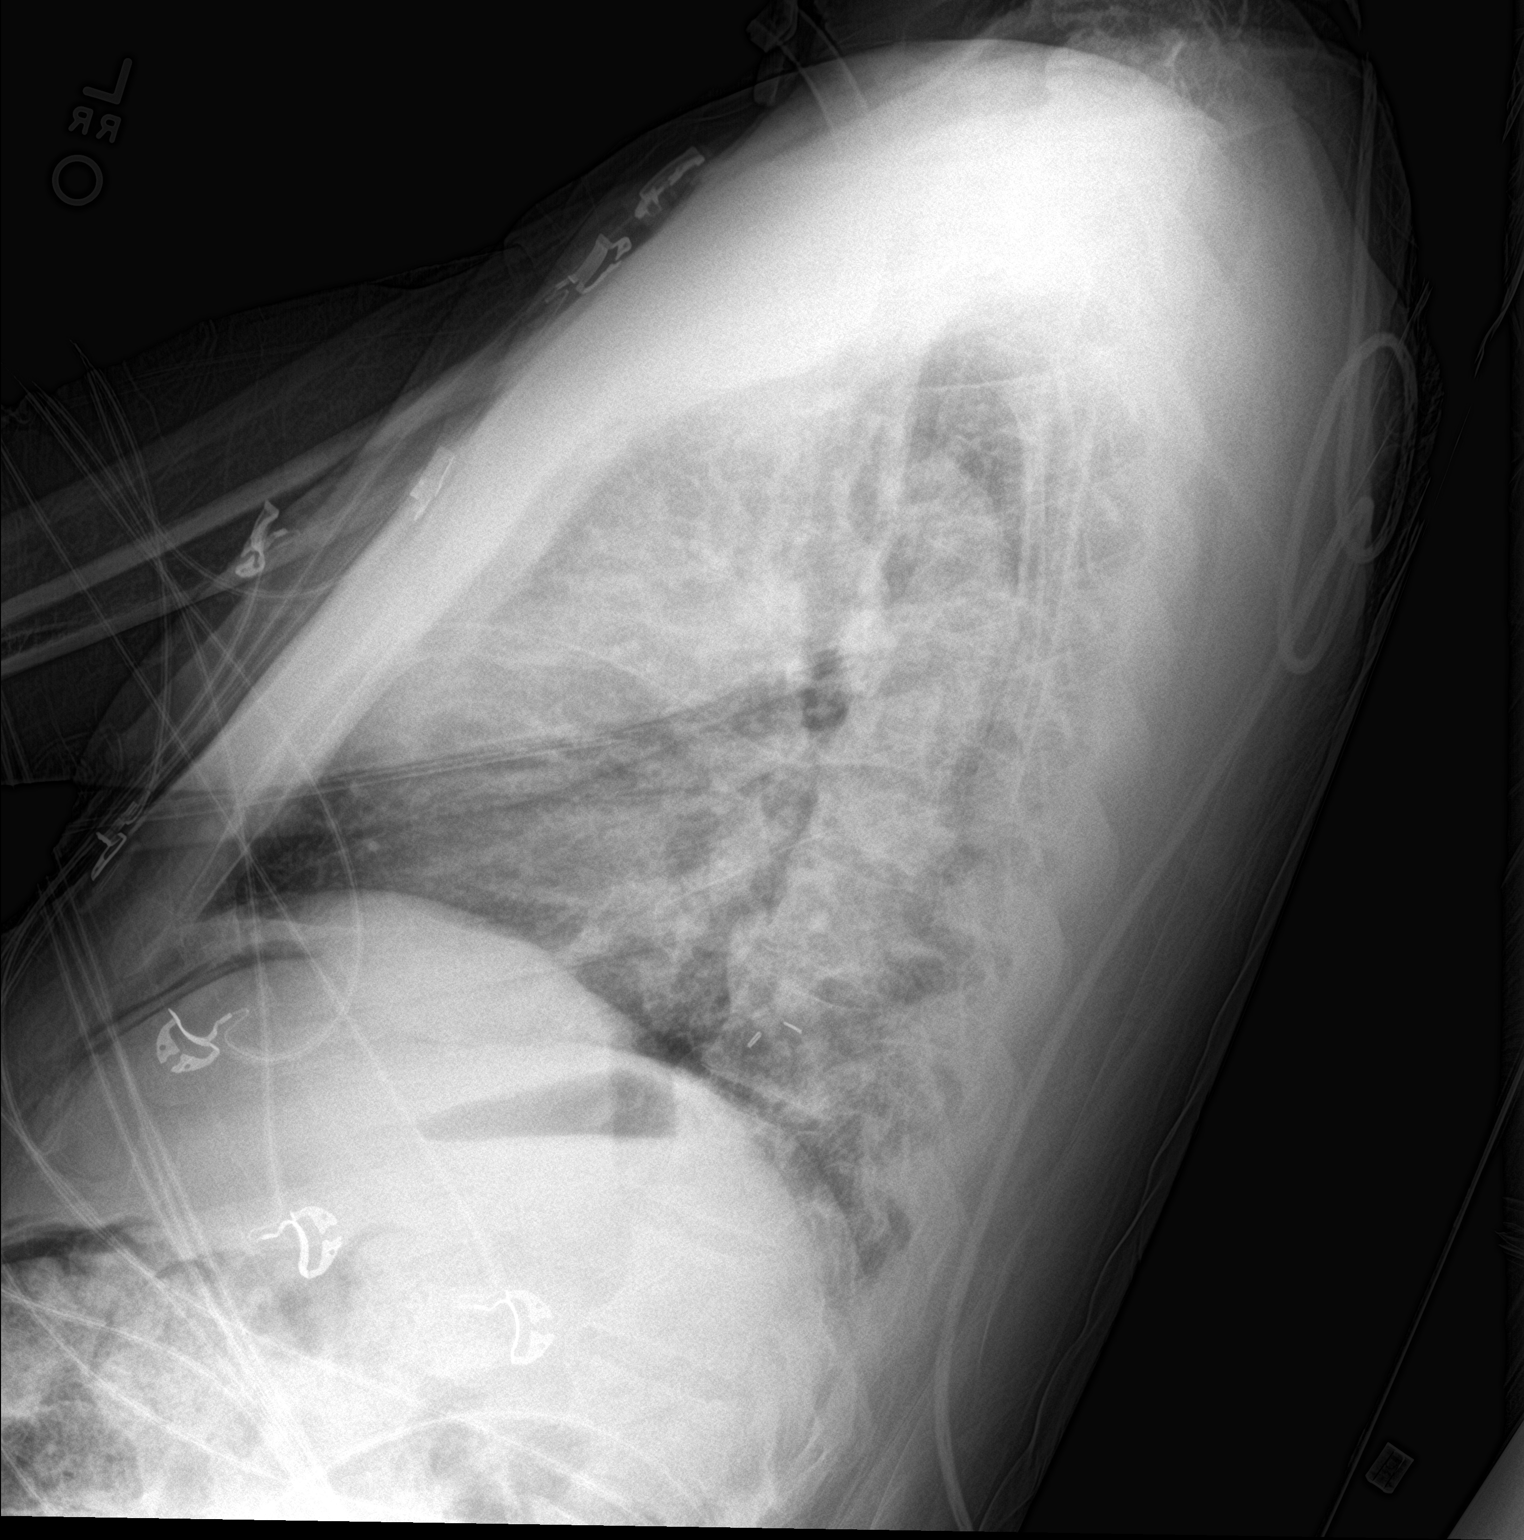

[chest ap strecther]
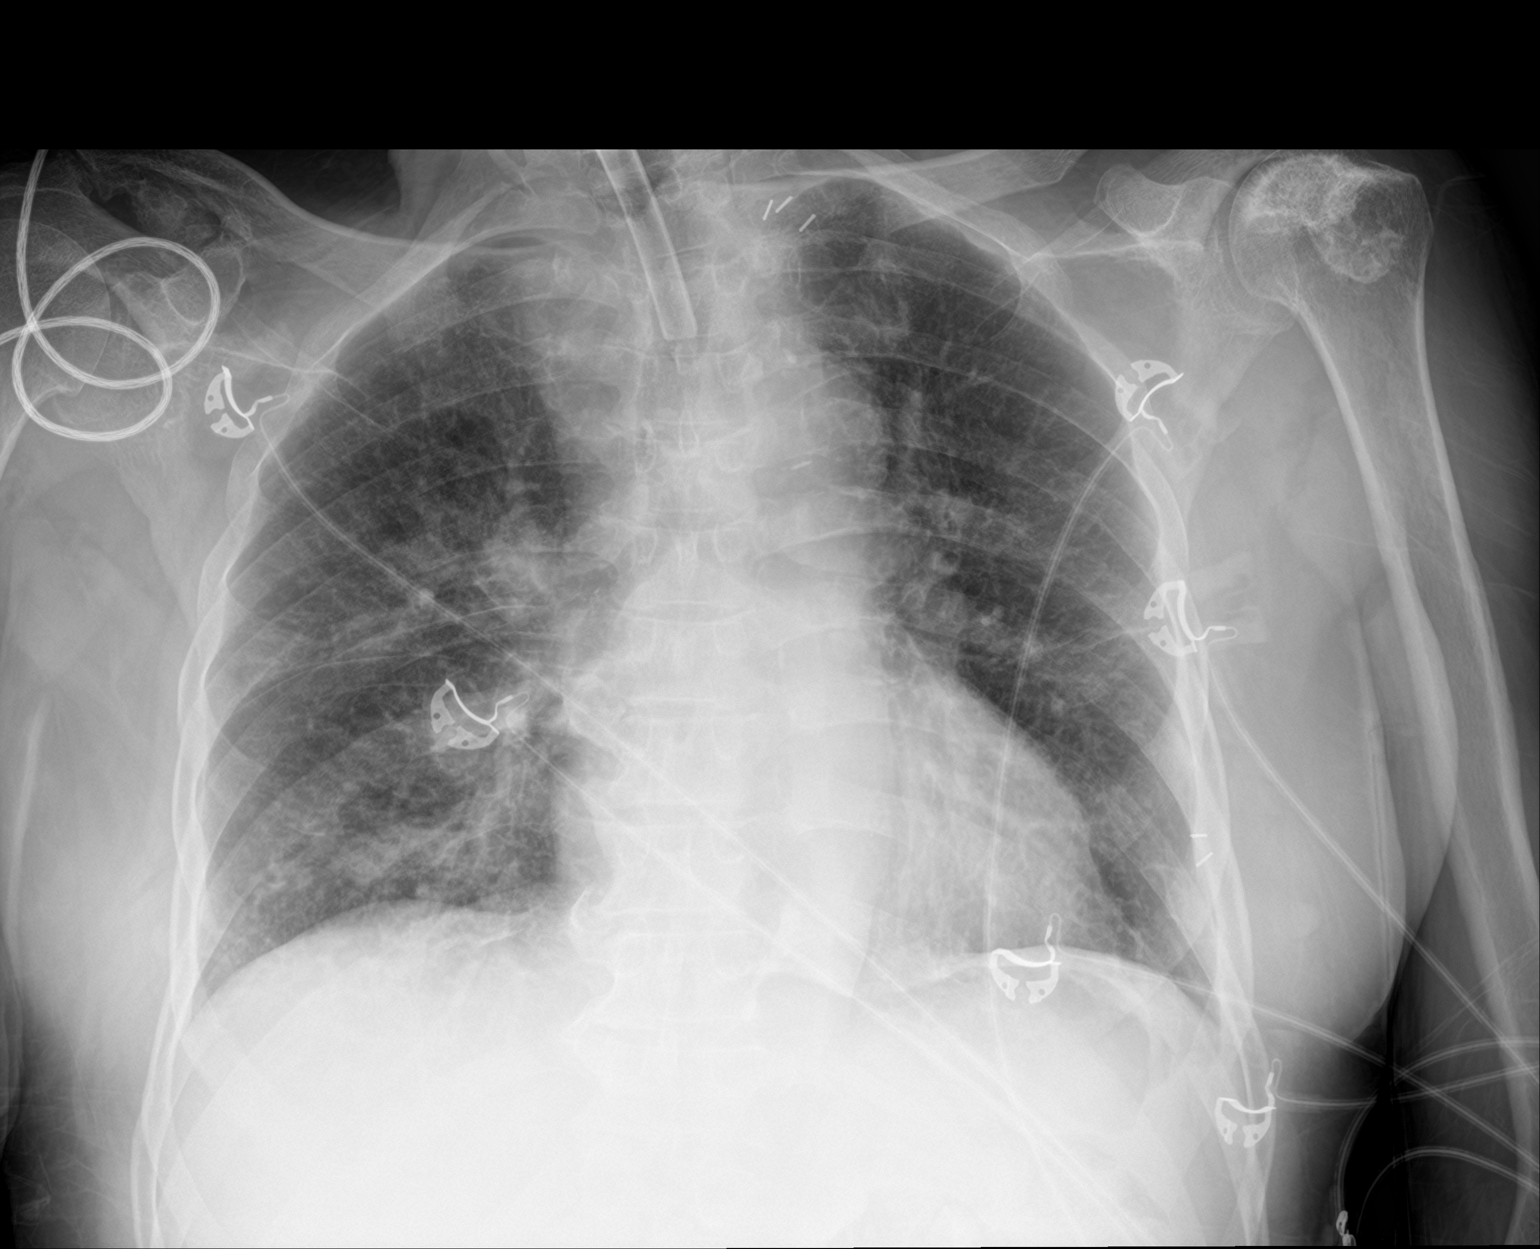

[2 of 2 positions shown; findings below may reference images not displayed]

FINDINGS: There is a tracheostomy with tracheostomy catheter tip 5.3 cm above
the carina. No pneumothorax. There is scarring in the left mid lung
region. There is interstitial thickening in the lungs bilaterally,
stable. There is no frank edema or consolidation. Heart size and
pulmonary vascularity are normal. No adenopathy. There are surgical
clips in the left upper thorax region. There is evidence of old
trauma in the right shoulder region with remodeling. There is
chronic avascular necrosis in the left humeral head.
IMPRESSION: Areas of scarring and probable chronic bronchitis. No frank edema or
consolidation. Stable cardiac silhouette. Postoperative changes as
noted. No pneumothorax.

Persistent avascular necrosis in the left humeral head.
Posttraumatic change with remodeling right shoulder region.

## 2019-05-19 IMAGING — CT CT ABD-PELV W/ CM
2 of 5 series · 15 of 46 positions shown, 17 images · IV contrast (APPLIED)
Comparison: 12/18/2017

CLINICAL DATA: Nausea, vomited blood during the night, aspirated,

EXAM:
CT ABDOMEN AND PELVIS WITH CONTRAST
TECHNIQUE: Multidetector CT imaging of the abdomen and pelvis was performed
using the standard protocol following bolus administration of
intravenous contrast. Sagittal and coronal MPR images reconstructed
from axial data set.
CONTRAST:  100mL 2DE18D-KJJ IOPAMIDOL (2DE18D-KJJ) INJECTION 61% IV.
No oral contrast.

[Series 2: axial st · axial · 0.85mm/px · z∈[-528,-34]mm · 12 of 111 slices shown, 14 images]
[im 6/111  soft-tissue]
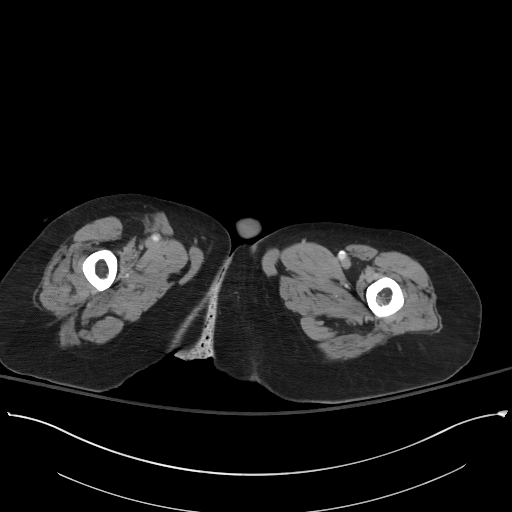
[im 6/111  bone]
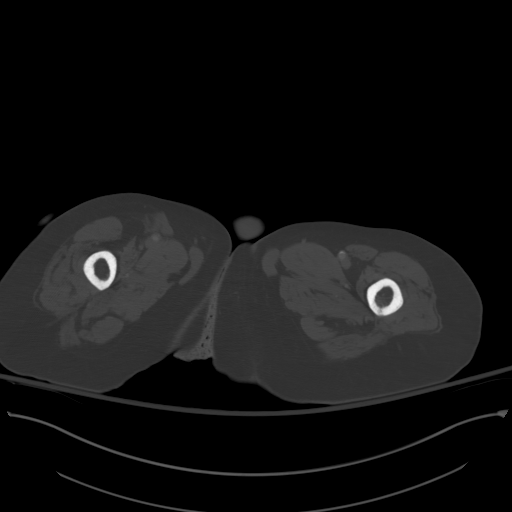
[im 16/111  soft-tissue]
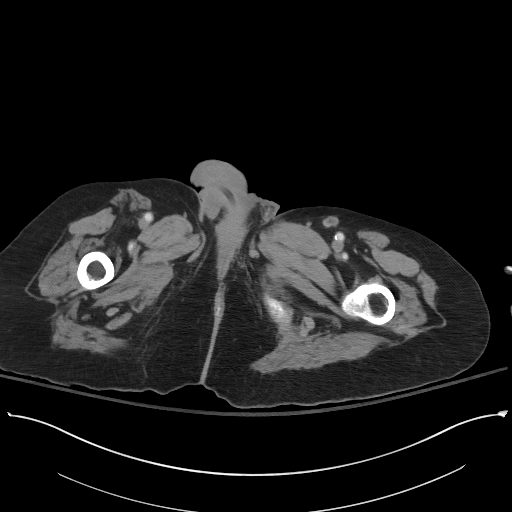
[im 27/111  soft-tissue]
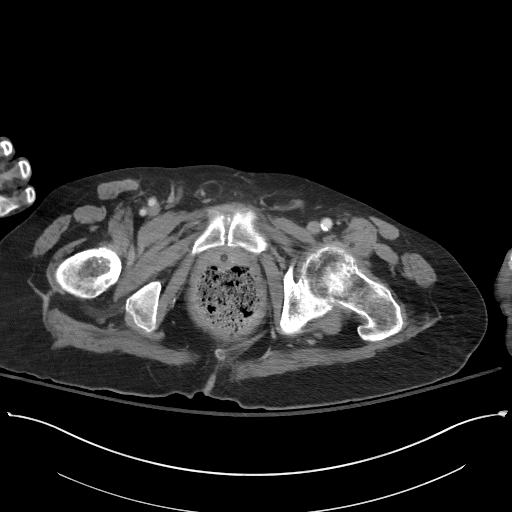
[im 32/111  soft-tissue]
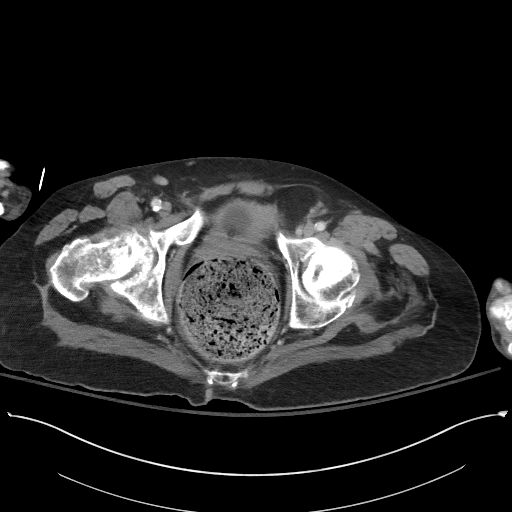
[im 42/111  soft-tissue]
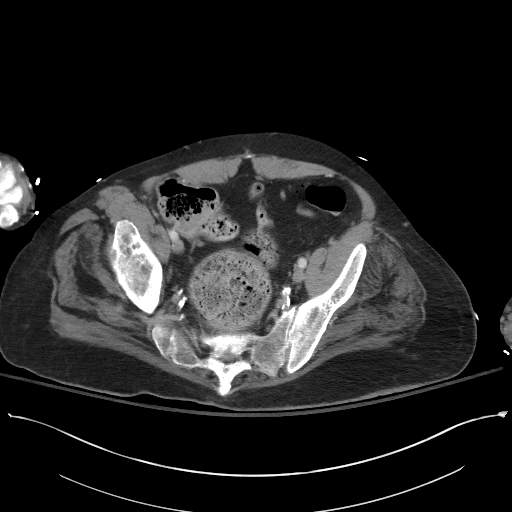
[im 53/111  soft-tissue]
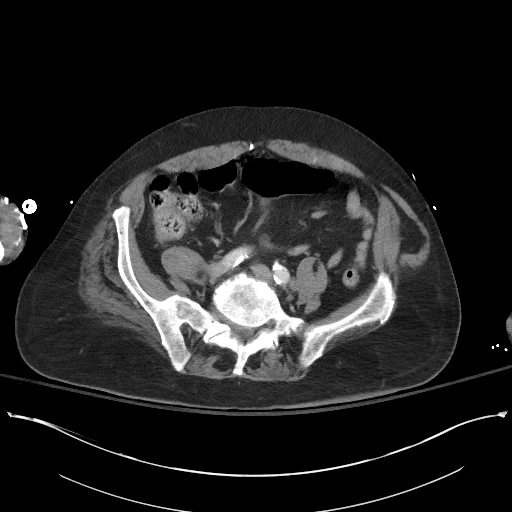
[im 58/111  soft-tissue]
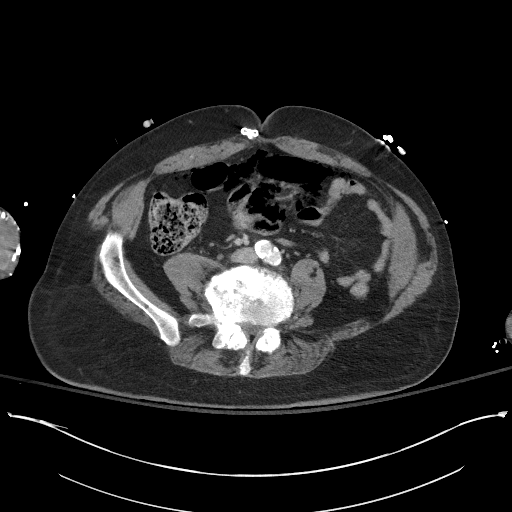
[im 69/111  soft-tissue]
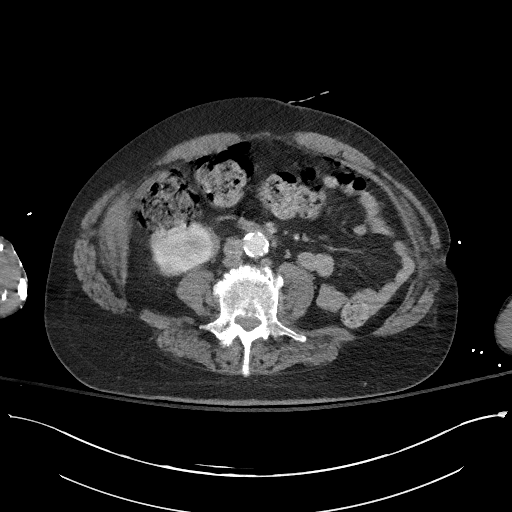
[im 79/111  soft-tissue]
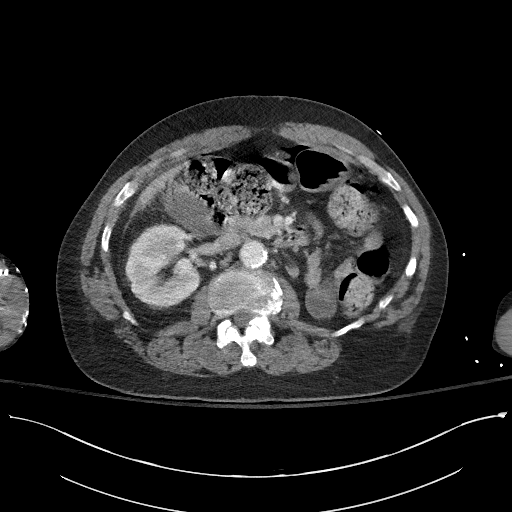
[im 79/111  bone]
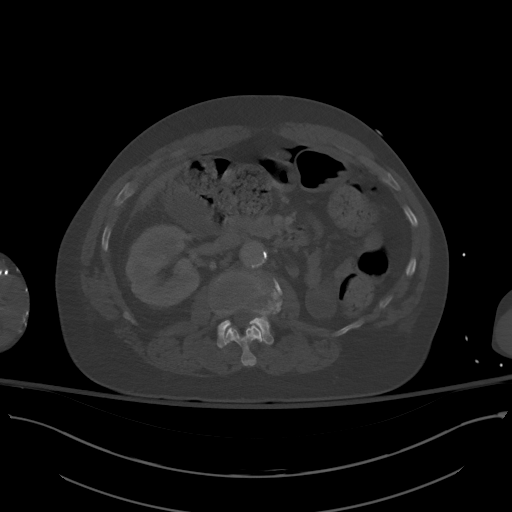
[im 84/111  soft-tissue]
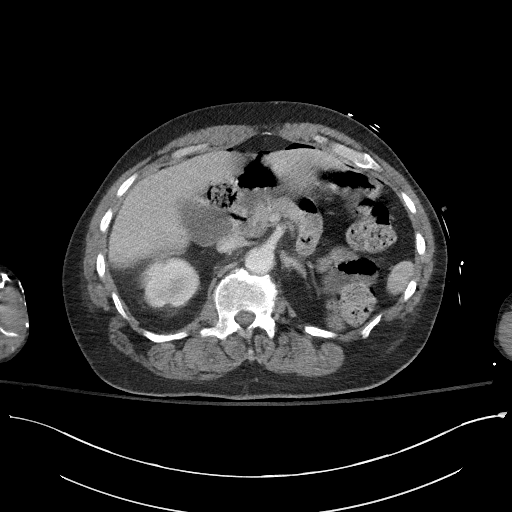
[im 95/111  soft-tissue]
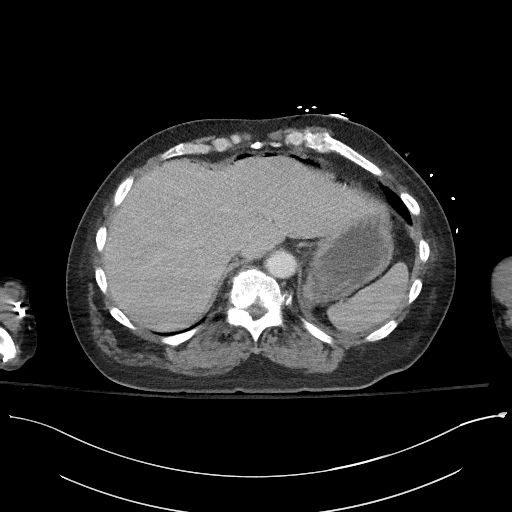
[im 105/111  soft-tissue]
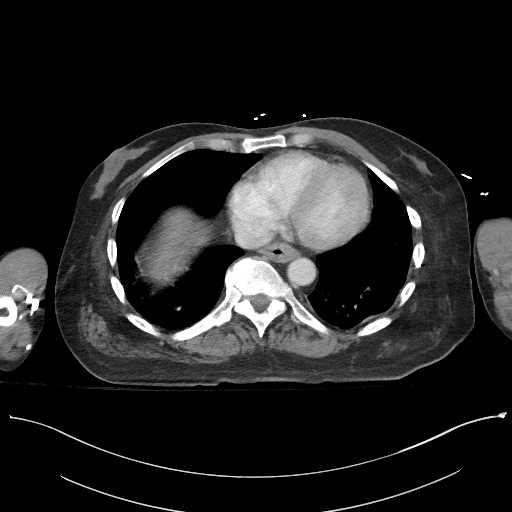

[Series 5: coronal st · coronal · 0.86mm/px · 3 of 90 slices shown]
[im 30/90  soft-tissue]
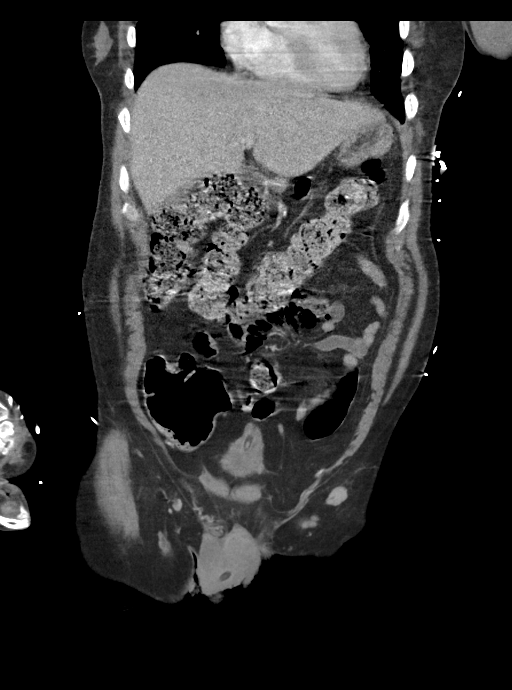
[im 40/90  soft-tissue]
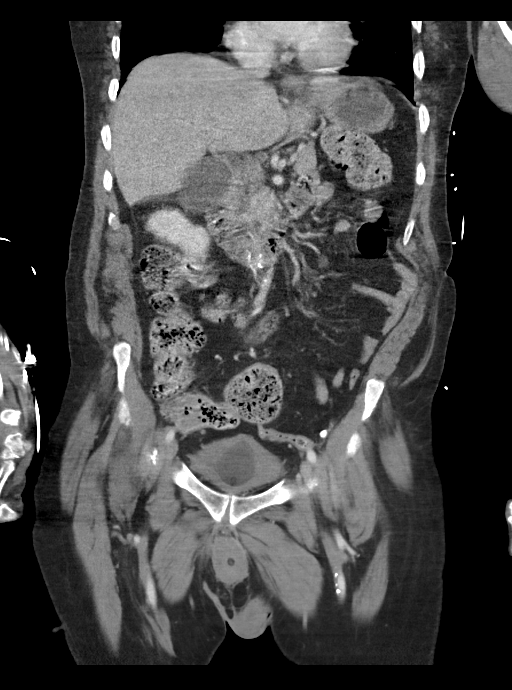
[im 50/90  soft-tissue]
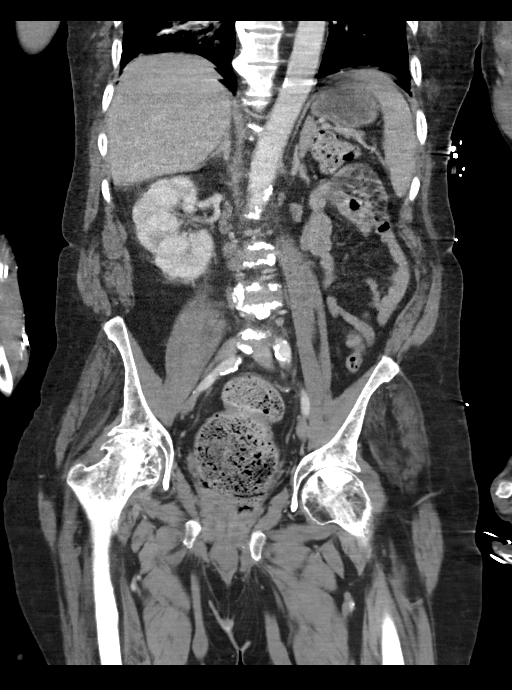

[15 of 46 positions shown; findings below may reference images not displayed]

FINDINGS: Lower chest: Minimal atelectasis LEFT base. RIGHT lower lobe
infiltrate which could represent in pneumonia or aspiration.

Hepatobiliary: Vague 9 mm nonspecific low-attenuation focus
anteriorly within liver. No other focal hepatic abnormalities.
Gallbladder unremarkable. No biliary dilatation.

Pancreas: Normal appearance

Spleen: Normal appearance

Adrenals/Urinary Tract: Adrenal glands normal appearance. Absent
LEFT kidney question congenital absence versus prior nephrectomy.
RIGHT kidney and ureter normal appearance. Bladder decompressed by
Foley catheter.

Stomach/Bowel: Significantly increased stool within rectum with
additional increased stool in ascending and transverse colon.
Appendix not definitely visualized. Gastrostomy tube in stomach.
Mild wall thickening of the distal esophagus question esophagitis
versus mass, though distal esophageal injury from vomiting could
cause wall thickening. Stomach and remaining bowel loops
unremarkable.

Vascular/Lymphatic: Extensive atherosclerotic calcifications aorta
and iliac arteries without aneurysm. No adenopathy.

Reproductive: No prostatic enlargement.

Other: Free air identified in upper abdomen. Unless patient has had
recent intra-abdominal surgery or recent placement of gastrostomy
tube, findings would be consistent with a perforated viscus. No free
fluid. Small BILATERAL inguinal hernias containing fat larger on
LEFT..

Musculoskeletal: Diffuse osseous demineralization. Avascular
necrosis changes of the femoral heads bilaterally. Degenerative disc
disease changes lumbar spine.
IMPRESSION: Free intraperitoneal air; unless patient has had a recent
intra-abdominal procedure or recent placement of the gastrostomy
tube, findings are consistent with a perforated viscus.

Increased stool in rectum as well as the proximal half of the colon.

Wall thickening of distal esophagus which could be related to
esophagitis, mass, or, in a patient with a history of vomiting,
distal esophageal injury.

RIGHT lower lobe infiltrate question pneumonia versus aspiration.

Small BILATERAL inguinal hernias containing fat.

Critical Value/emergent results were called by telephone at the time
of interpretation on 04/09/2018 at [DATE] to Dr. GONGADZE KYUREGYAN ,
who verbally acknowledged these results.

## 2019-05-20 IMAGING — DX DG CHEST 1V PORT
1 series · 1 of 1 positions shown · non-contrast
Comparison: 04/09/2018

CLINICAL DATA: Dyspnea.

EXAM:
PORTABLE CHEST 1 VIEW

[chest ap]
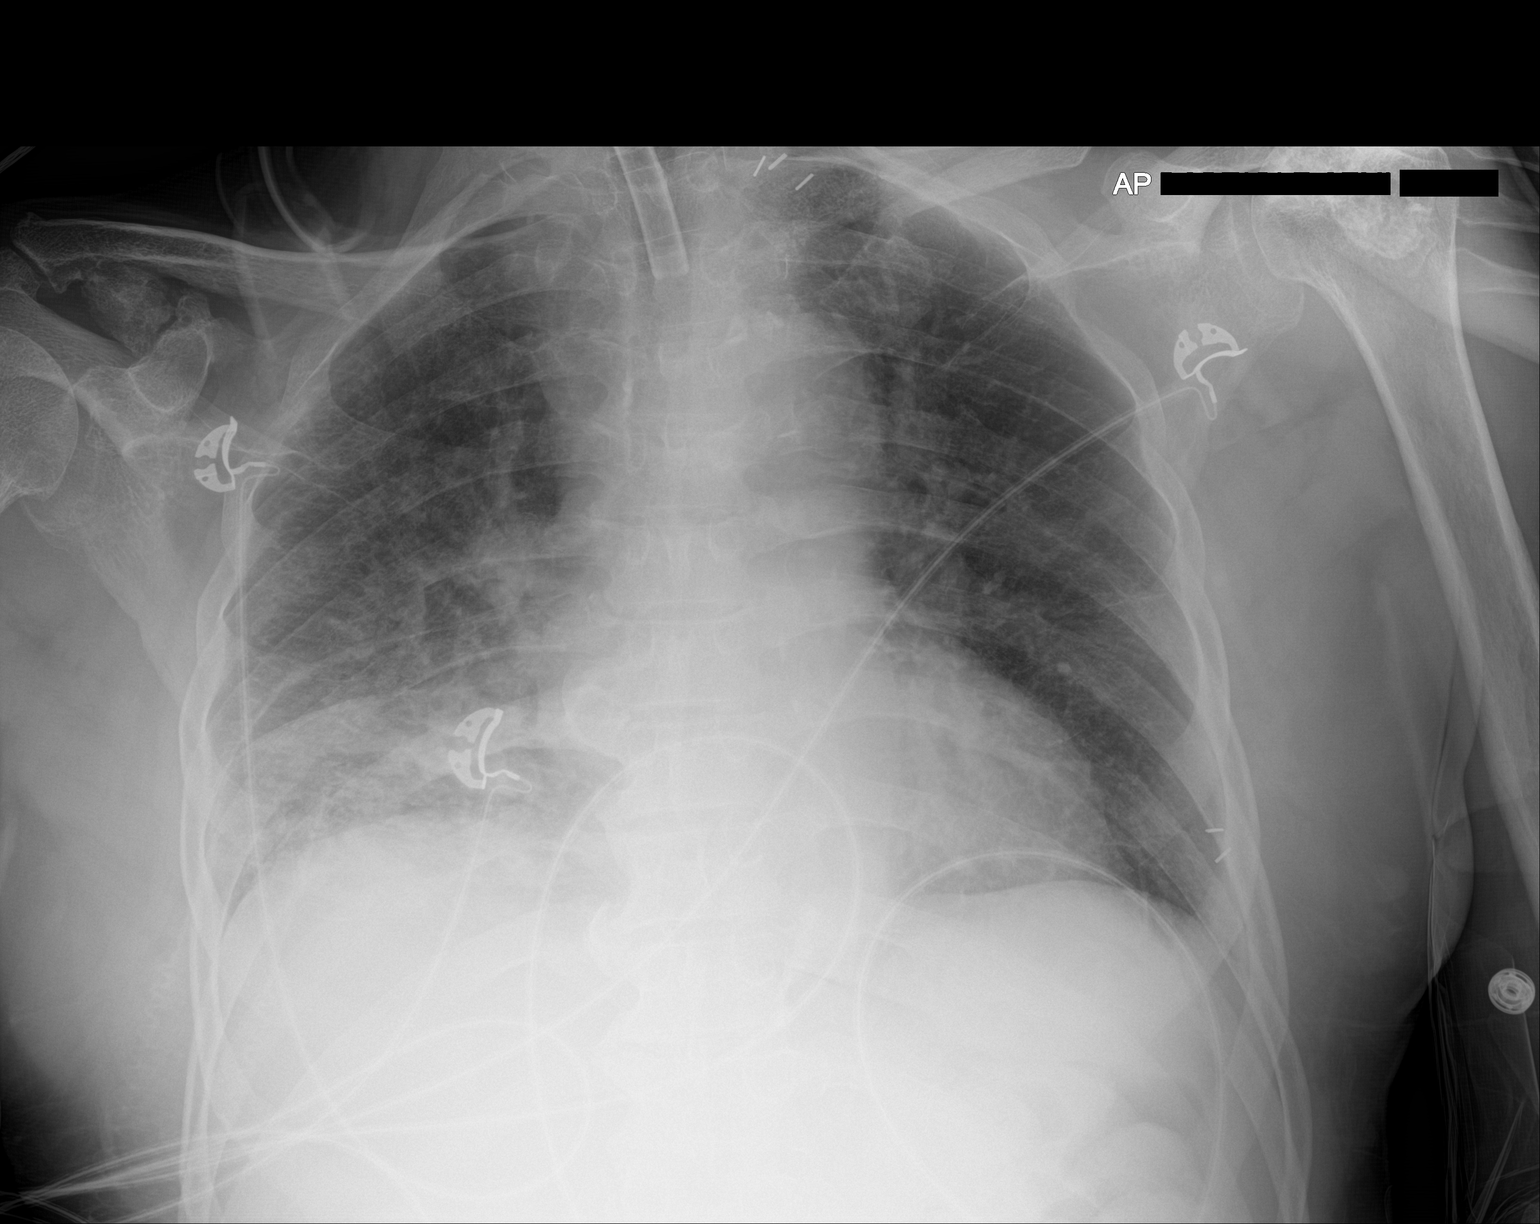

[1 of 1 positions shown; findings below may reference images not displayed]

FINDINGS: 7877 hours. Tracheostomy tube again noted. Left lung remains clear.
Interval progression of airspace disease at the right base.
Interstitial markings are diffusely coarsened with chronic features.
The cardiopericardial silhouette is within normal limits for size.
Bones are diffusely demineralized. Telemetry leads overlie the
chest.
IMPRESSION: Interval progression of airspace disease at the right base. Given
the interval increase in asymmetric elevation right hemidiaphragm a
component of the opacity at the right base may be atelectatic.

## 2019-05-21 IMAGING — DX DG CHEST 1V PORT
1 series · 1 of 1 positions shown · non-contrast
Comparison: Chest x-ray from yesterday.

CLINICAL DATA: Shortness of breath and cough.

EXAM:
PORTABLE CHEST 1 VIEW

[chest ap]
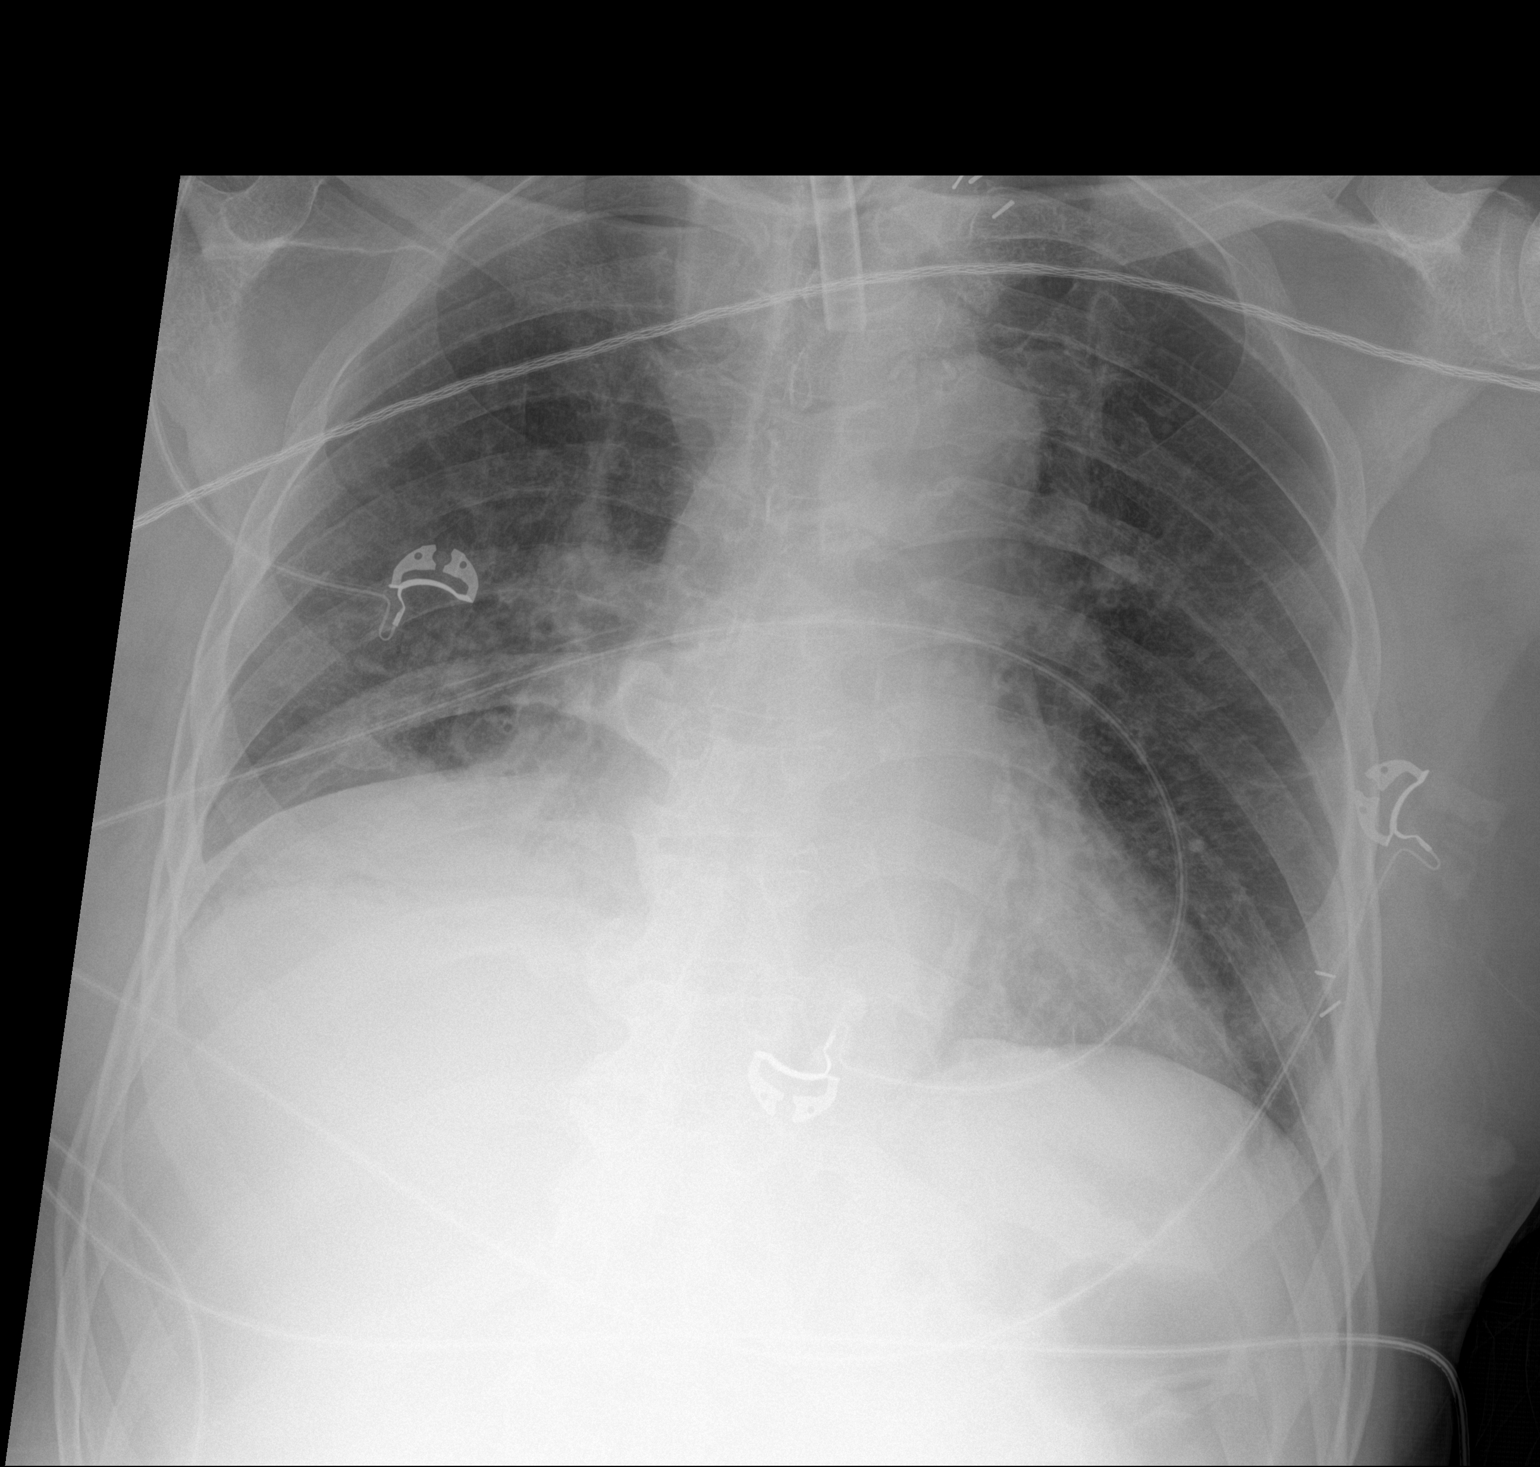

[1 of 1 positions shown; findings below may reference images not displayed]

FINDINGS: Unchanged tracheostomy tube. Stable cardiomediastinal silhouette.
Normal pulmonary vascularity. Increased elevation of the right
hemidiaphragm. Right basilar airspace disease is similar to prior
study. The left lung is clear. No large pleural effusion. No
pneumothorax. No acute osseous abnormality.
IMPRESSION: Unchanged right basilar infiltrate and/or atelectasis.

## 2019-05-22 IMAGING — CT CT ABD-PELV W/ CM
2 of 5 series · 14 of 46 positions shown, 16 images · IV contrast (APPLIED)
Comparison: CT abdomen pelvis, 04/09/2018 and older studies.

CLINICAL DATA: 67 y.o. male with medical history significant of
hypertension, hyperlipidemia, stroke status post tracheostomy and
PEG tube placement, nonverbal status, chronic kidney disease stage
III, GERD, aspiration pneumonia, last hospitalization in December 2017 with aspiration pneumonia treated with IV and subsequent oral
antibiotics, fecal impaction and possible esophagitis treated with
PPI was found by his sister this morning with black vomit everywhere
apparently. CT of the abdomen showed free intraperitoneal air
concerning for perforated viscus along with right lower lobe
infiltrate. Antibiotics were started. General surgery was consulted
who recommended admission to [REDACTED].

EXAM:
CT ABDOMEN AND PELVIS WITH CONTRAST
TECHNIQUE: Multidetector CT imaging of the abdomen and pelvis was performed
using the standard protocol following bolus administration of
intravenous contrast.
CONTRAST:  100mL OMNIPAQUE IOHEXOL 300 MG/ML  SOLN

[Series 2: axial st · axial · 0.98mm/px · z∈[+913,+1363]mm · 11 of 102 slices shown, 13 images]
[im 6/102  soft-tissue]
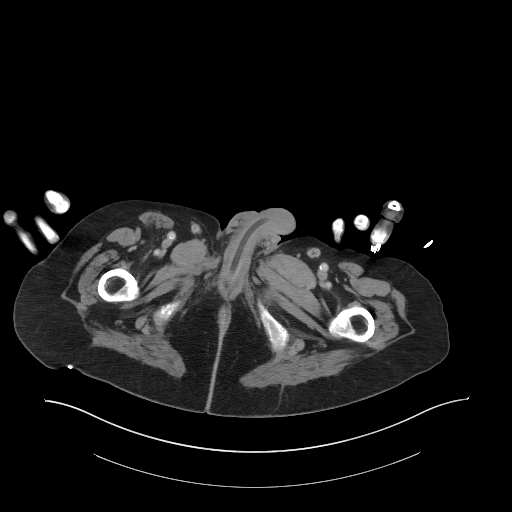
[im 6/102  bone]
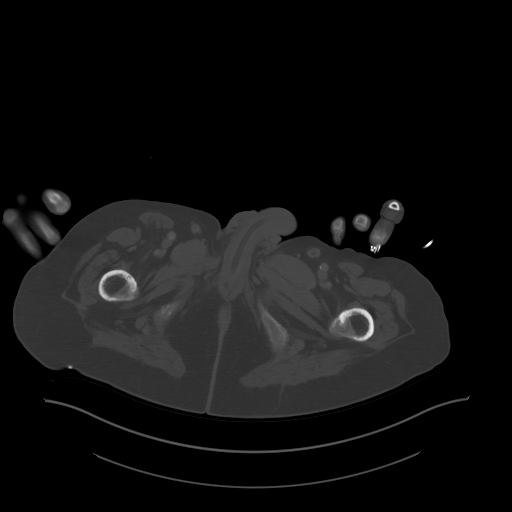
[im 18/102  soft-tissue]
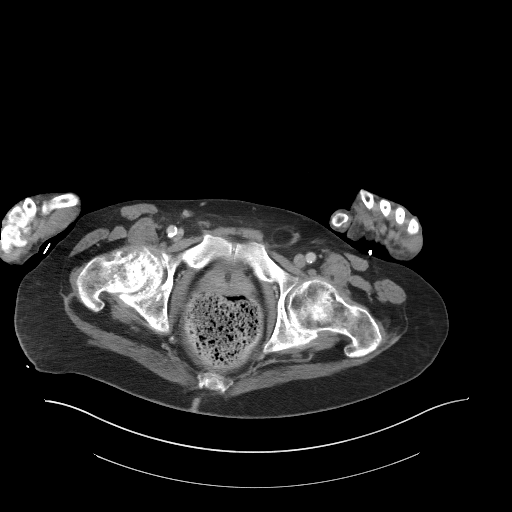
[im 24/102  soft-tissue]
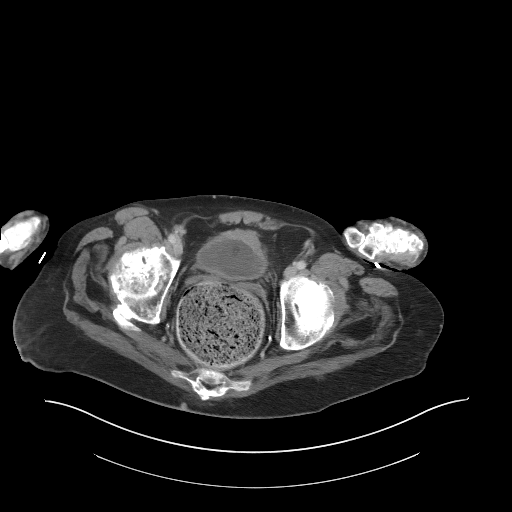
[im 36/102  soft-tissue]
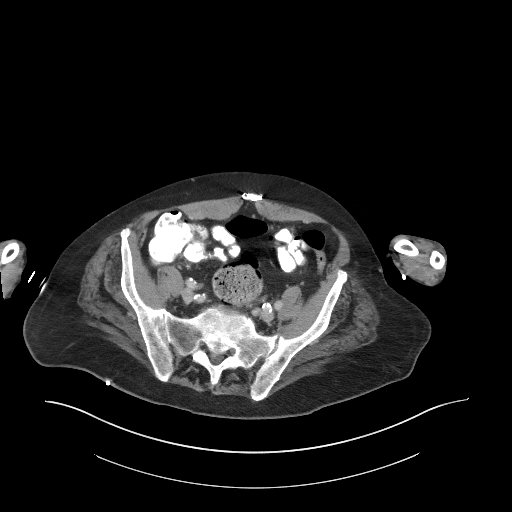
[im 42/102  soft-tissue]
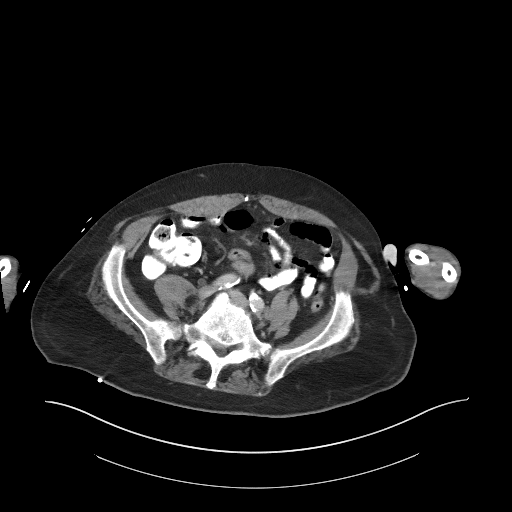
[im 54/102  soft-tissue]
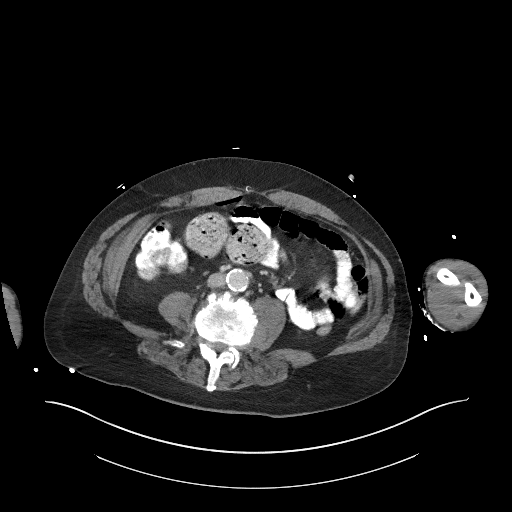
[im 60/102  soft-tissue]
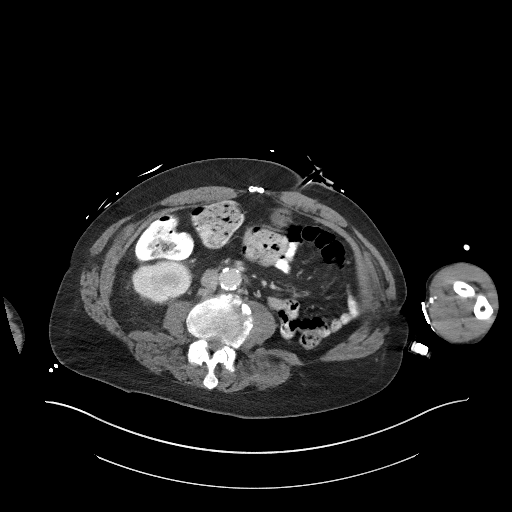
[im 66/102  soft-tissue]
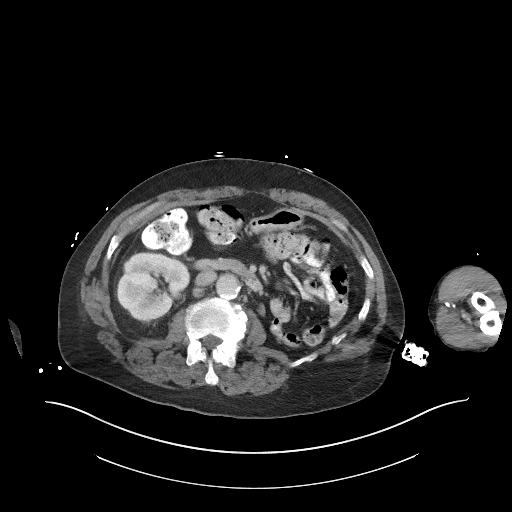
[im 78/102  soft-tissue]
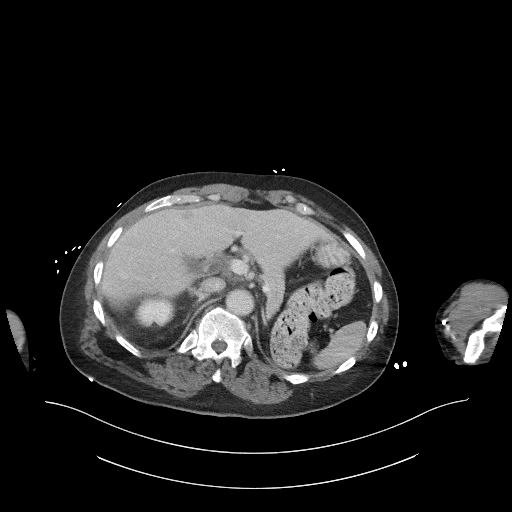
[im 78/102  bone]
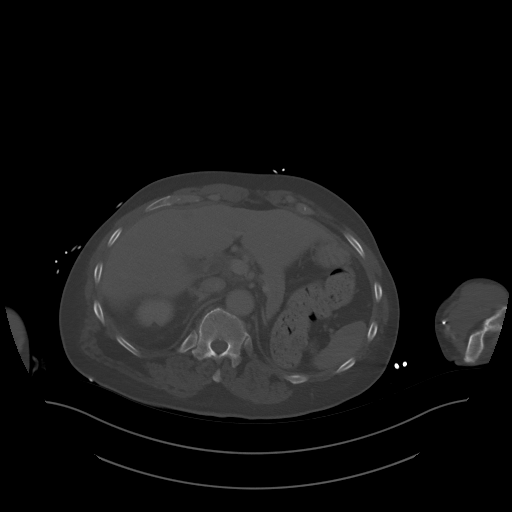
[im 84/102  soft-tissue]
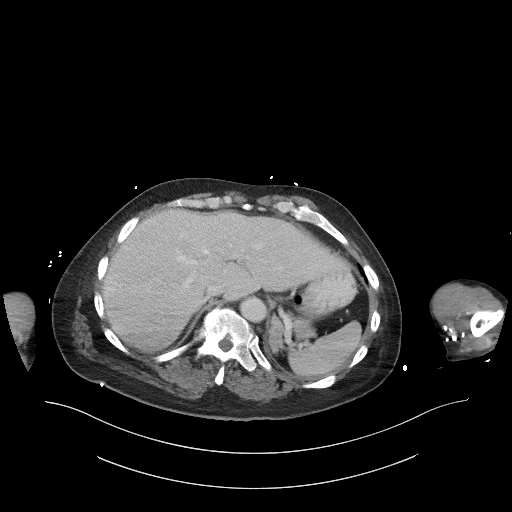
[im 96/102  soft-tissue]
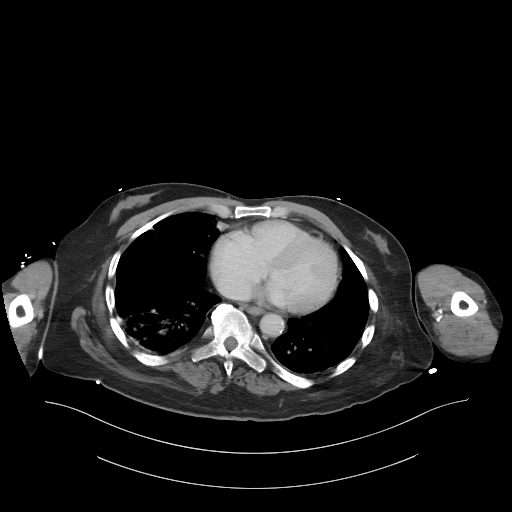

[Series 4: coronal st · coronal · 0.70mm/px · 3 of 86 slices shown]
[im 29/86  soft-tissue]
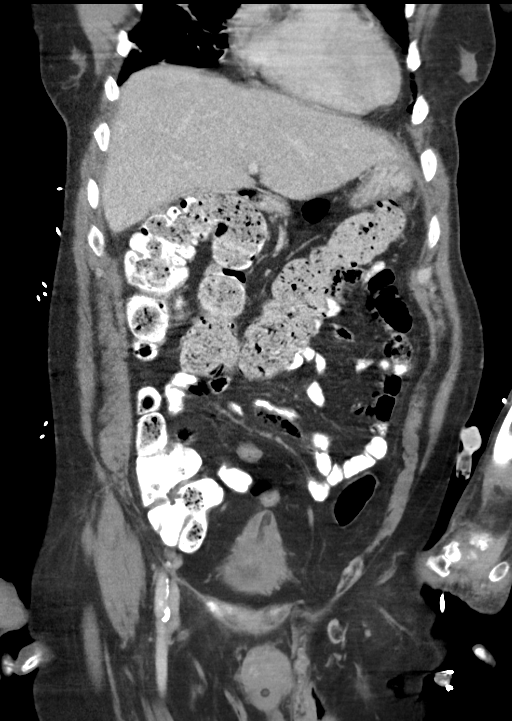
[im 38/86  soft-tissue]
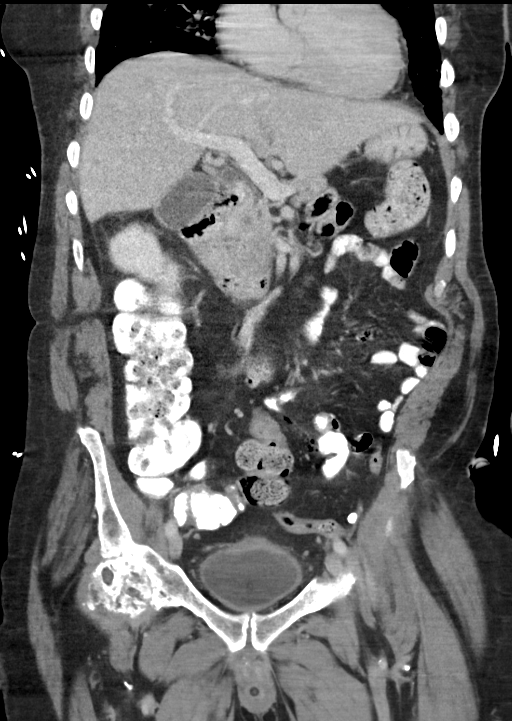
[im 48/86  soft-tissue]
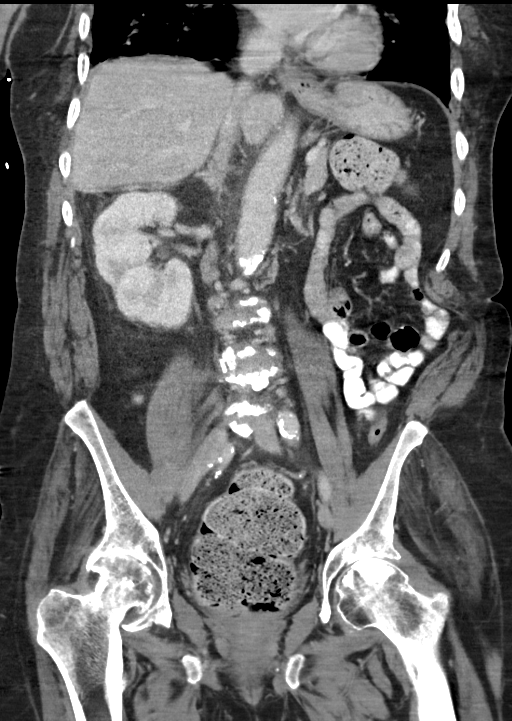

[14 of 46 positions shown; findings below may reference images not displayed]

FINDINGS: Lower chest: Patchy peribronchovascular right lower lobe opacity is
similar to the prior CT. There is new opacity in the right middle
lobe. This is most likely atelectasis.

Hepatobiliary: Small hypoattenuating lesion in the anterior liver,
segment 4B, unchanged. No new liver lesions. Normal gallbladder. No
bile duct dilation.

Pancreas: Unremarkable. No pancreatic ductal dilatation or
surrounding inflammatory changes.

Spleen: Normal in size without focal abnormality.

Adrenals/Urinary Tract: No adrenal masses. Absent left kidney,
likely congenital. Right kidney normal in overall size and position.
No mass, stone or hydronephrosis. Normal right ureter. Bladder
mostly decompressed with a Foley catheter.

Stomach/Bowel: Rectum is moderate to markedly distended with stool,
but without wall thickening or adjacent inflammation. Colon is
normal in caliber. No colonic wall thickening or adjacent
inflammation. Small bowel is normal in caliber with no wall
thickening or inflammation. Normal appendix visualized.

Stomach is mostly decompressed. Gastrostomy tube pillars well
position within the anterior aspect of the mid to distal stomach,
unchanged.

Vascular/Lymphatic: Aortic atherosclerotic changes extending into
the iliac vessels. No adenopathy.

Reproductive: Unremarkable.

Other: There is still a small amount of free intraperitoneal air,
although this has decreased since the prior CT. There is a small
amount of air that lies along the mesenteric border of small-bowel
loops in the left mid to lower abdomen. No ascites.

Stable anterior abdominal wall sutures.

Musculoskeletal: Advanced arthropathic changes of the hips with
chronic avascular necrosis of the left femoral head. There are
degenerative changes the visualized spine most evident at L4-L5. No
fractures. No osteoblastic or osteolytic lesions
IMPRESSION: 1. Free intraperitoneal air is decreased in amount from the prior
CT. The source of this free air is unclear. There is no bowel wall
thickening or inflammation to indicate a source of bowel
perforation. Gastrostomy tube appears well positioned.
2. There is persistent opacity in the right lower lobe with opacity
now seen in the right middle lobe. Middle lobe opacity is likely
atelectasis. Lower lobe opacity may reflect atelectasis, pneumonia
or aspiration pneumonitis.
3. Rectum remains significantly distended with stool, but without
rectal wall thickening or adjacent inflammation.
4. No new abnormalities.

## 2019-05-23 IMAGING — DX DG CHEST 1V PORT
1 series · 1 of 1 positions shown · non-contrast
Comparison: Is 04/11/2018

CLINICAL DATA: dyspnea

EXAM:
PORTABLE CHEST 1 VIEW

[chest ap]
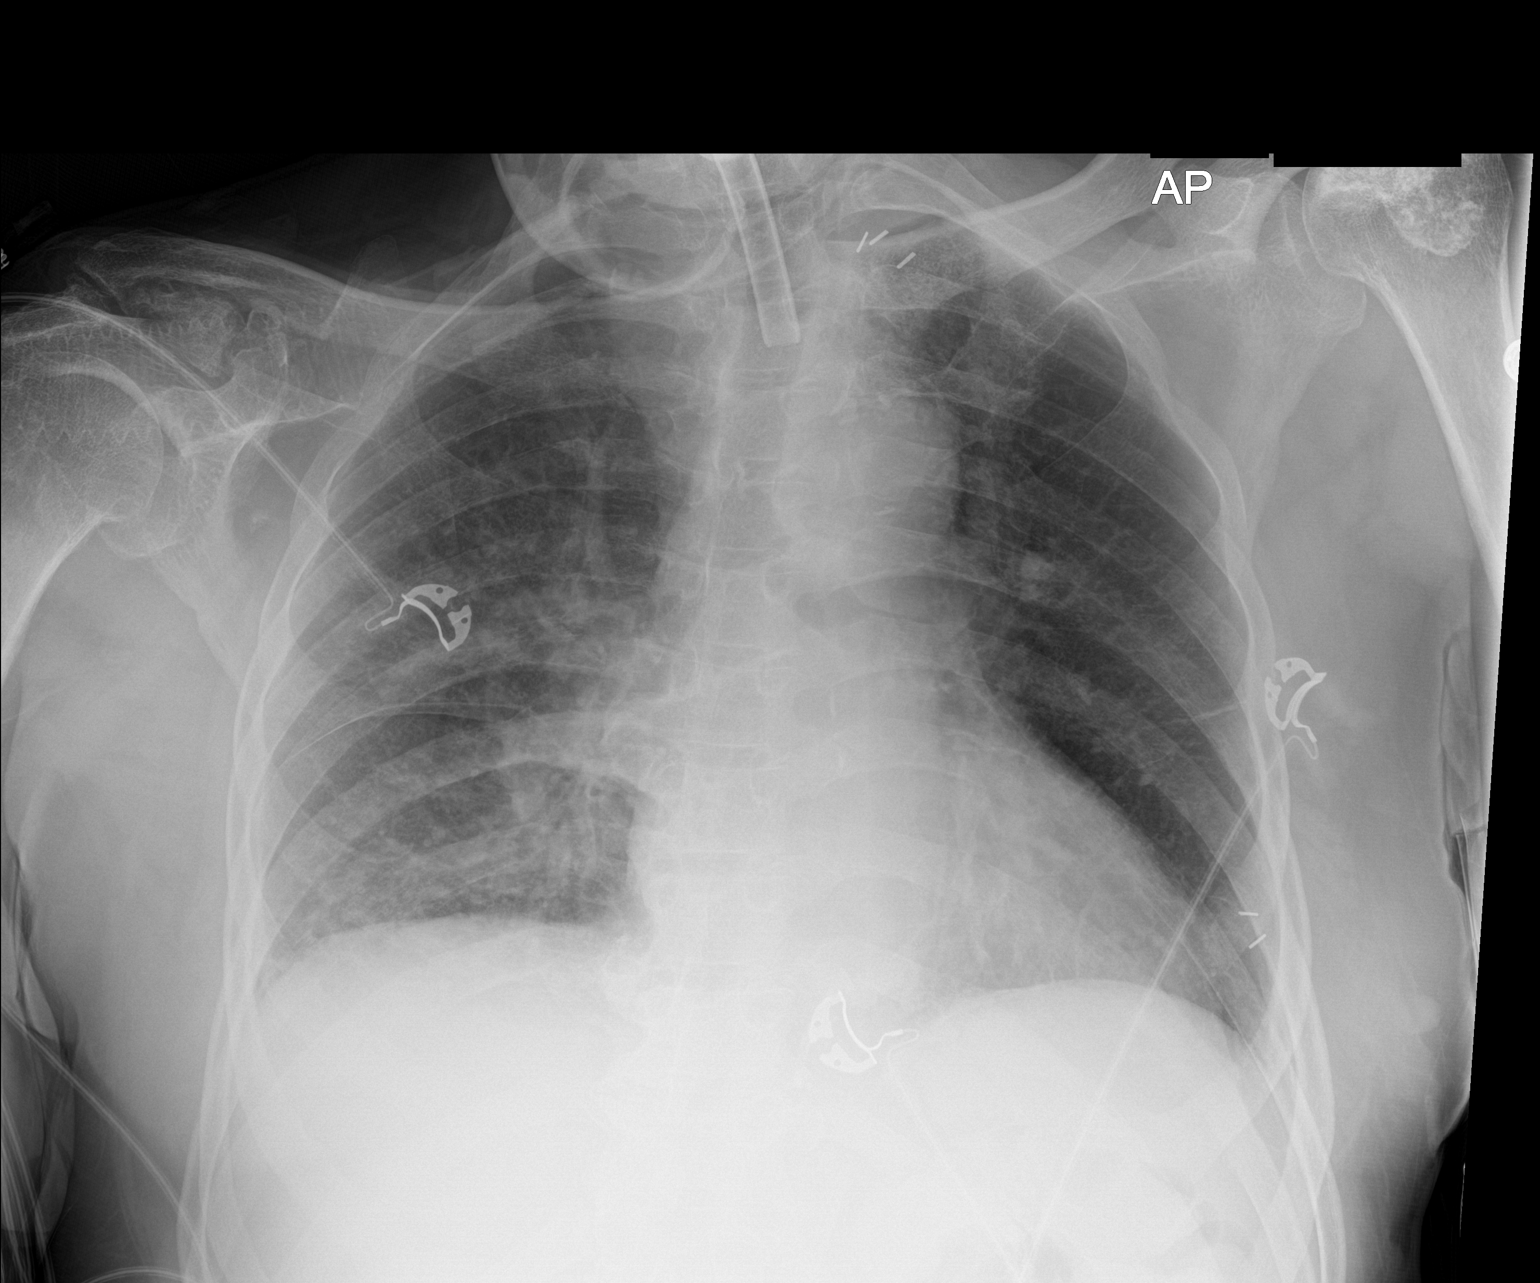

[1 of 1 positions shown; findings below may reference images not displayed]

FINDINGS: Tracheostomy tube is in place, tip unchanged within the mid trachea.
The heart size is mildly enlarged. Patchy opacity within the RIGHT
LOWER lobe has improved. There is minimal persistent MEDIAL opacity
and RIGHT perihilar opacity. LEFT lung is clear.
IMPRESSION: Improved aeration in the RIGHT LOWER lobe.

## 2019-05-25 IMAGING — DX DG CHEST 1V PORT
1 series · 1 of 1 positions shown · non-contrast
Comparison: 04/13/2018, 04/11/2018, 04/10/2018

CLINICAL DATA: 67-year-old male with a history

EXAM:
PORTABLE CHEST 1 VIEW

[chest ap]
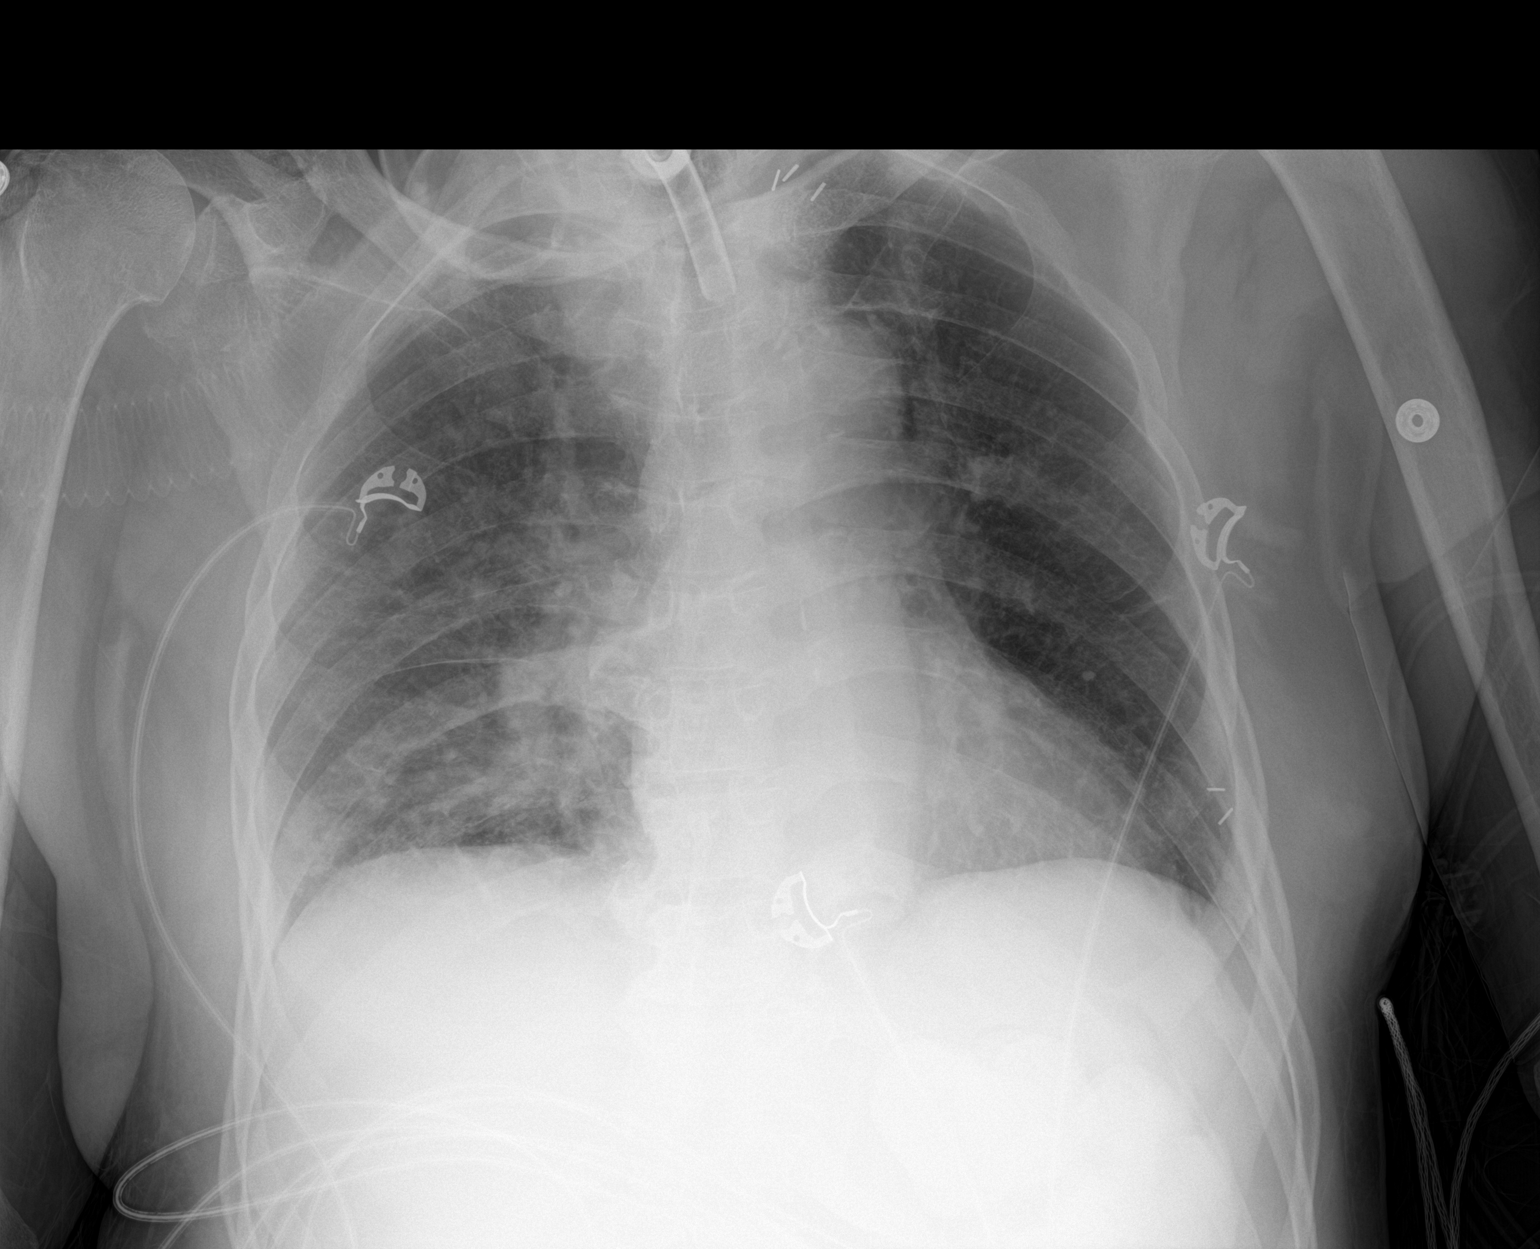

[1 of 1 positions shown; findings below may reference images not displayed]

FINDINGS: Cardiomediastinal silhouette unchanged in size and contour.

Unchanged tracheostomy tube with surgical clips projecting over the
left clavicle.

Mixed interstitial and airspace opacity persist in the right mid and
lower lung, unchanged from the prior. No new pleural effusion or
pneumothorax.

Partially imaged bone lesion of the proximal left humerus.
IMPRESSION: Similar appearance of the chest x-ray with mild mixed interstitial
and airspace disease of the right lung.

Unchanged tracheostomy tube.

## 2019-06-28 IMAGING — DX DG CHEST 1V PORT
1 series · 1 of 1 positions shown · non-contrast
Comparison: 04/15/2018

CLINICAL DATA: Dislodged tracheostomy tube

EXAM:
PORTABLE CHEST 1 VIEW

[chest ap]
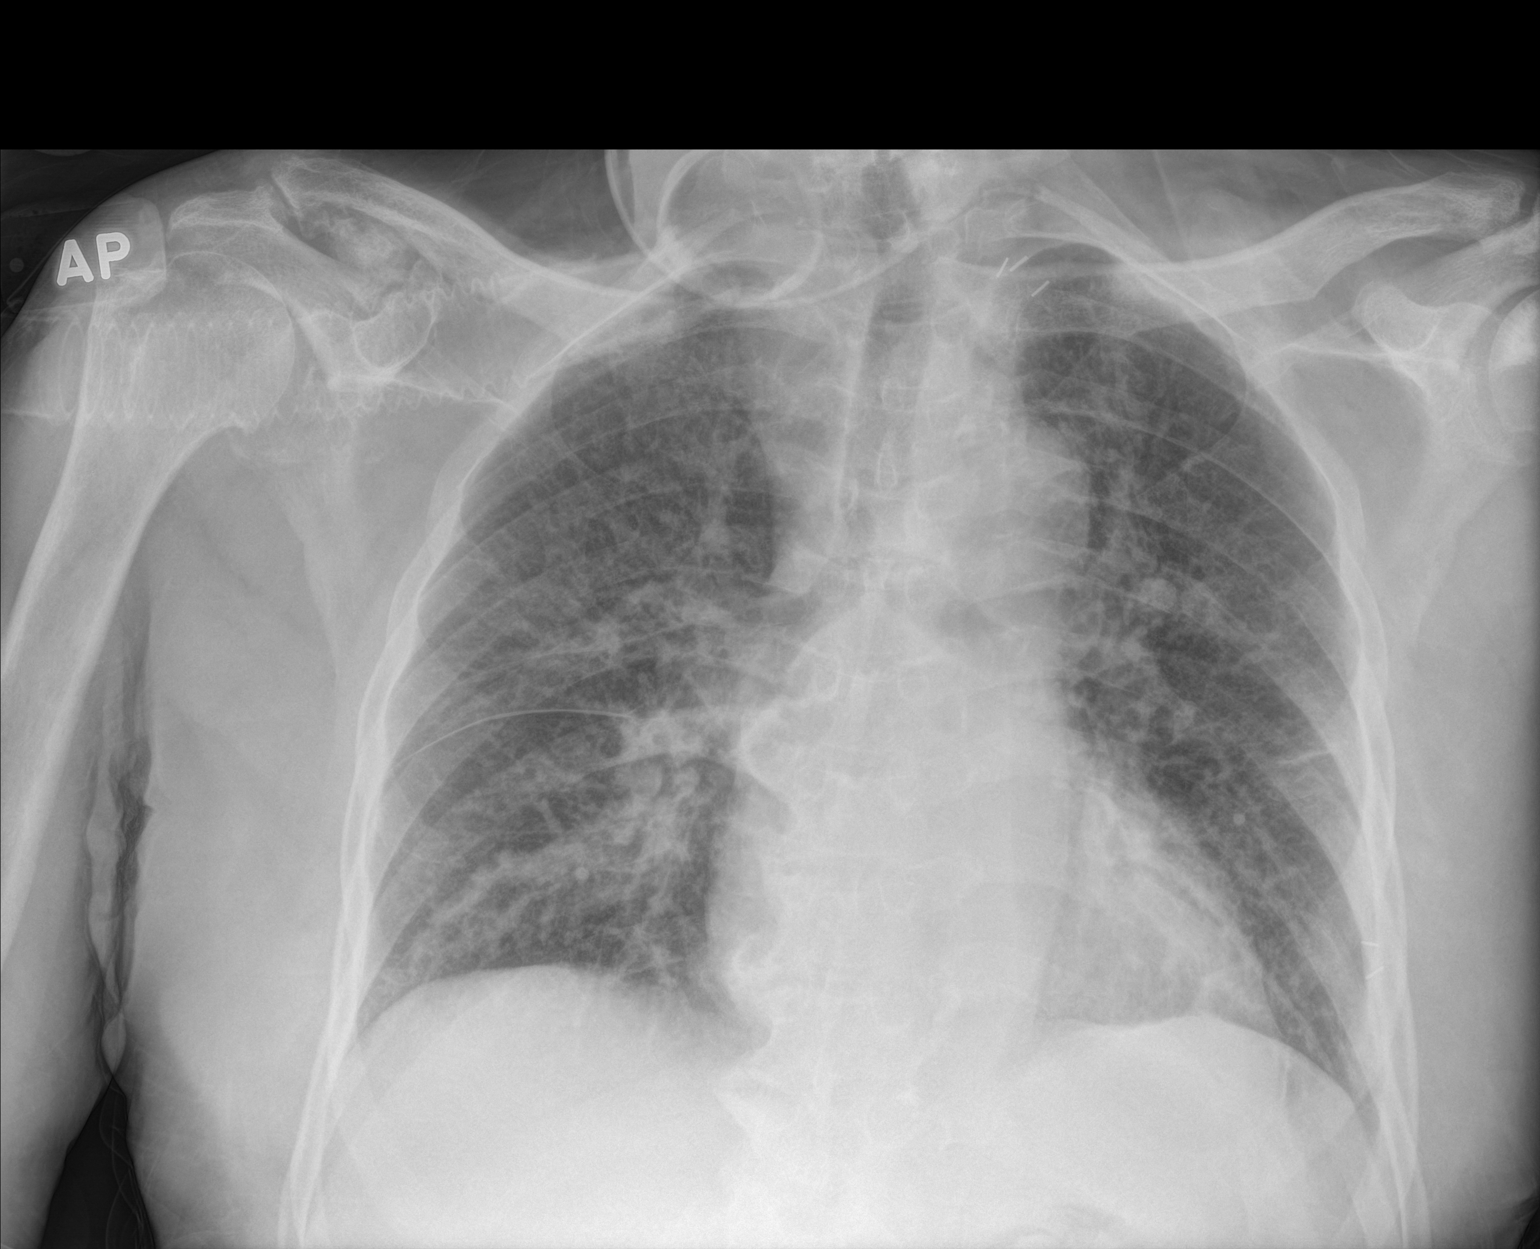

[1 of 1 positions shown; findings below may reference images not displayed]

FINDINGS: No tracheostomy tube is present. Bilateral chronic interstitial
thickening right greater than left. More focal bilateral lower lobe
hazy airspace disease which may reflect atelectasis versus
pneumonia. No pleural effusion or pneumothorax. Stable
cardiomediastinal silhouette. No aggressive osseous lesion. Mild
osteoarthritis of the right glenohumeral joint. Moderate arthropathy
of the right acromioclavicular joint.
IMPRESSION: No tracheostomy tube is present.

Bilateral chronic interstitial thickening right greater than left.
More focal bilateral lower lobe hazy airspace disease which may
reflect atelectasis versus pneumonia.

## 2019-12-29 IMAGING — CT CT ABDOMEN AND PELVIS WITHOUT CONTRAST
2 of 5 series · 16 of 46 positions shown, 18 images · IV contrast (APPLIED)
Comparison: April 12, 2018

CLINICAL DATA: Nausea vomiting and diffuse abdominal pain.

EXAM:
CT ABDOMEN AND PELVIS WITHOUT CONTRAST
TECHNIQUE: Multidetector CT imaging of the abdomen and pelvis was performed
following the standard protocol without IV contrast.

[Series 3: abd/ pelvis 5.0 i30f 2 · axial · 0.86mm/px · z∈[+802,+1237]mm · 13 of 97 slices shown, 15 images]
[im 5/97  soft-tissue]
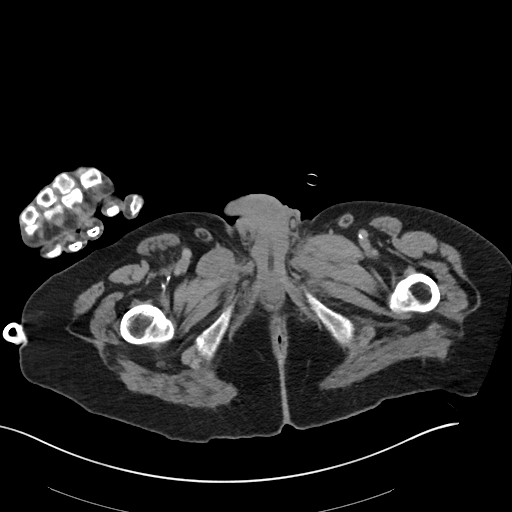
[im 5/97  bone]
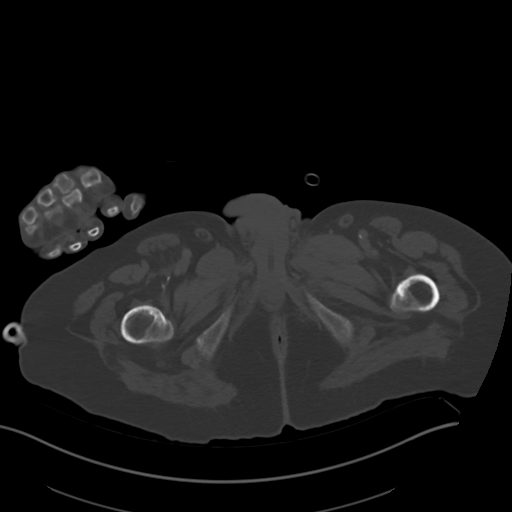
[im 15/97  soft-tissue]
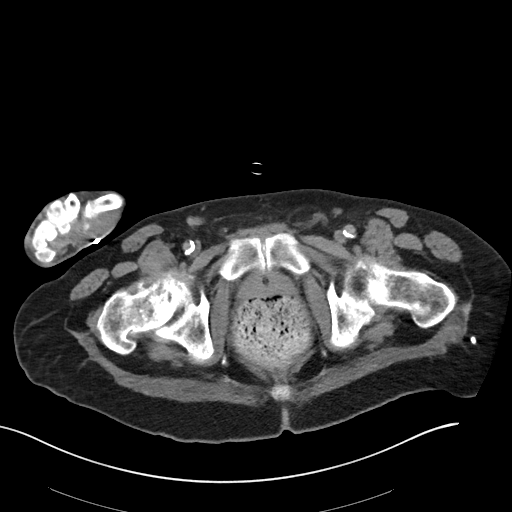
[im 20/97  soft-tissue]
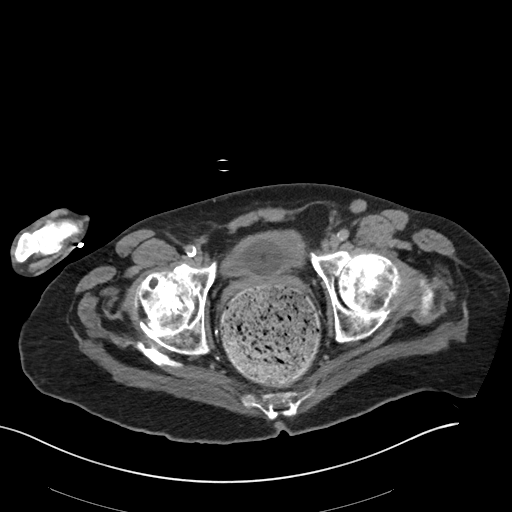
[im 29/97  soft-tissue]
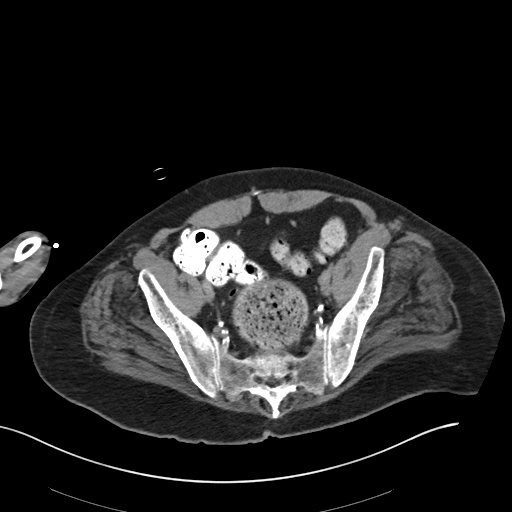
[im 34/97  soft-tissue]
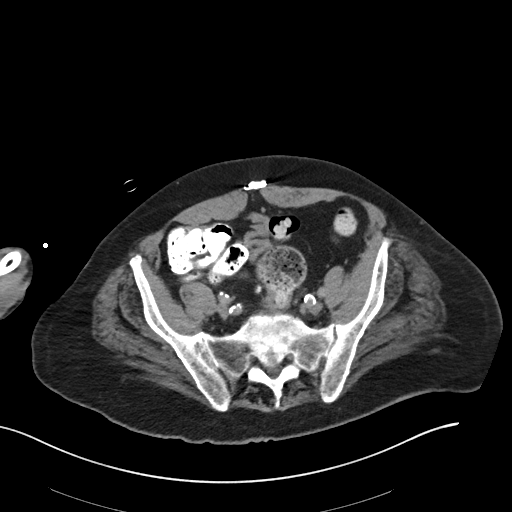
[im 44/97  soft-tissue]
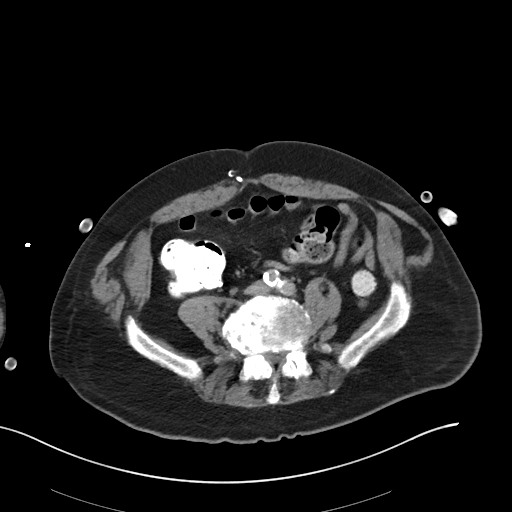
[im 49/97  soft-tissue]
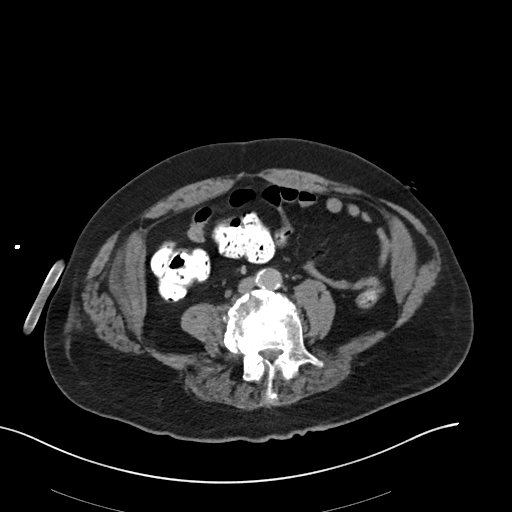
[im 53/97  soft-tissue]
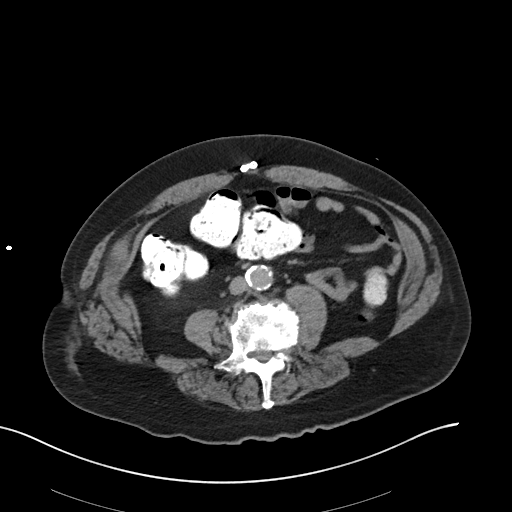
[im 63/97  soft-tissue]
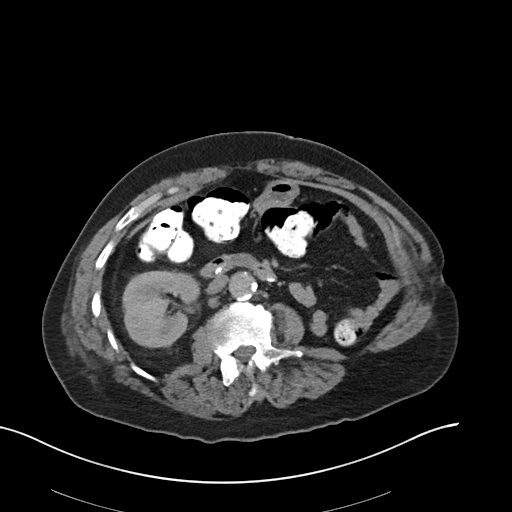
[im 63/97  bone]
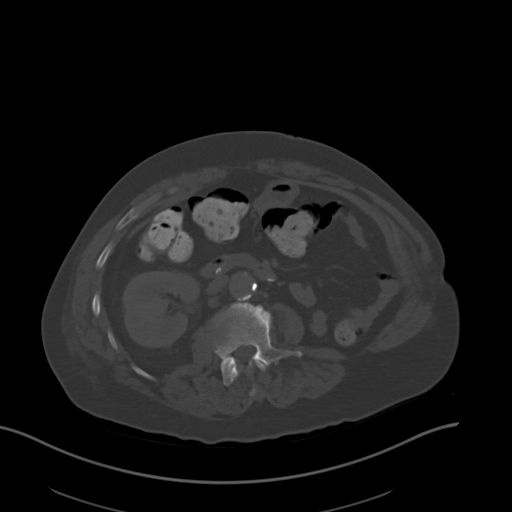
[im 68/97  soft-tissue]
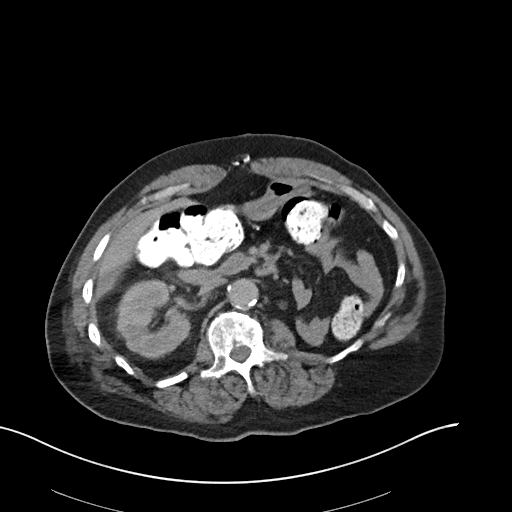
[im 77/97  soft-tissue]
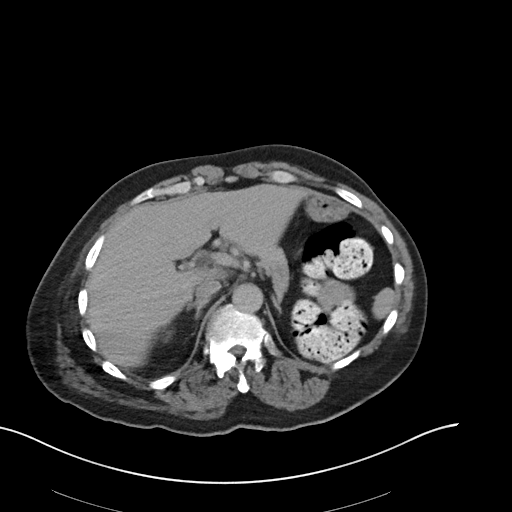
[im 82/97  soft-tissue]
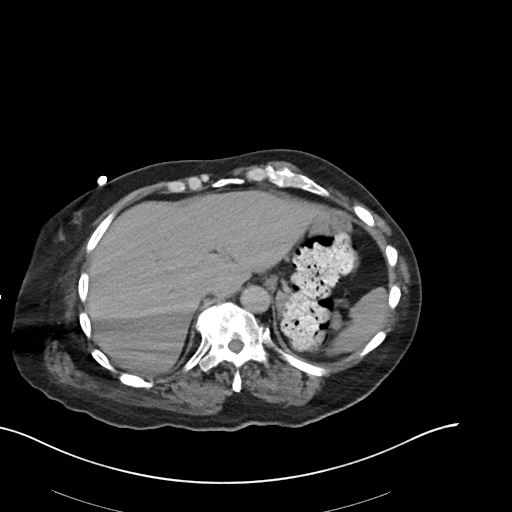
[im 92/97  soft-tissue]
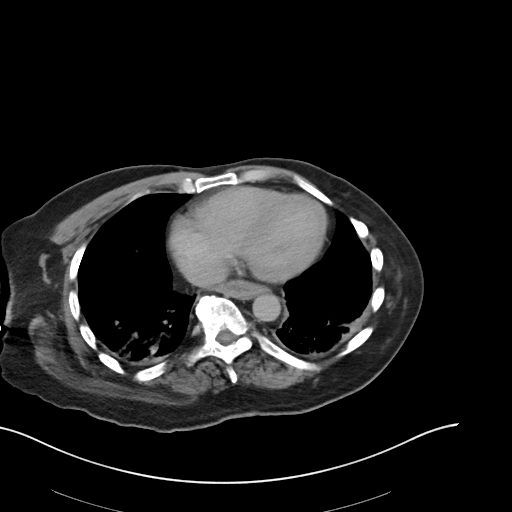

[Series 6: coronal soft tissue · coronal · 0.76mm/px · 3 of 100 slices shown]
[im 34/100  soft-tissue]
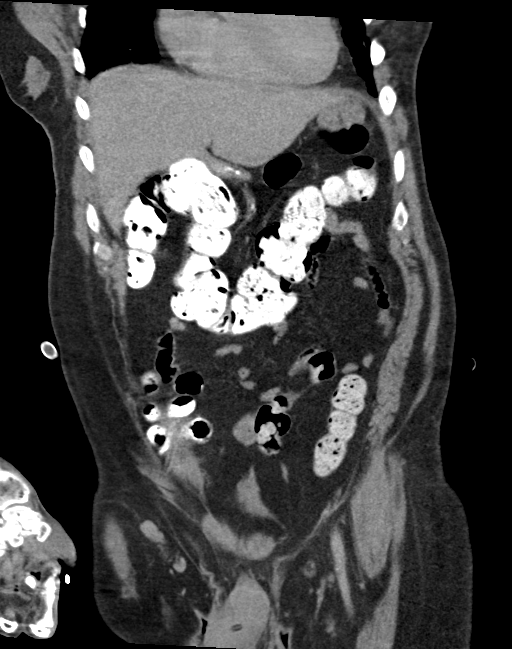
[im 45/100  soft-tissue]
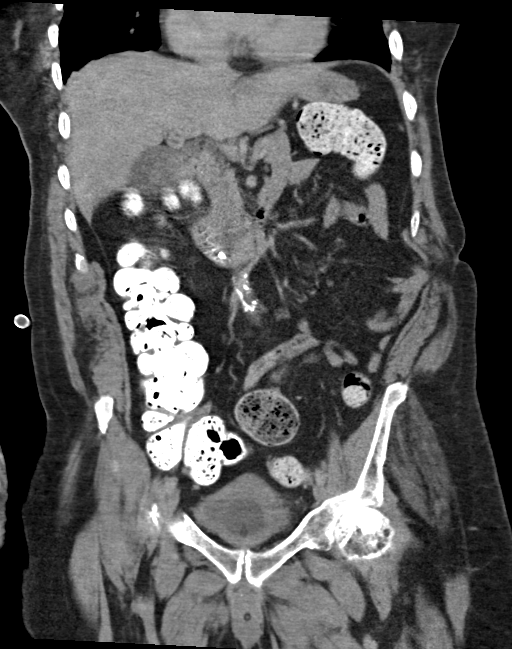
[im 56/100  soft-tissue]
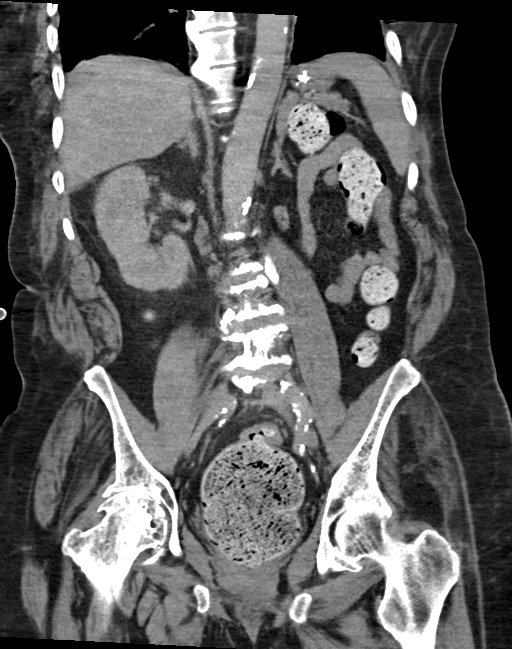

[16 of 46 positions shown; findings below may reference images not displayed]

FINDINGS: Lower chest: Mild bibasilar atelectasis versus airspace
consolidation.

Hepatobiliary: No focal liver abnormality is seen. No gallstones,
gallbladder wall thickening, or biliary dilatation.

Pancreas: Atrophic pancreas.

Spleen: Normal in size without focal abnormality.

Adrenals/Urinary Tract: Tiny atrophic left kidney. The right kidney,
bilateral adrenal glands, right ureter are normal. Urinary bladder
is decompressed around urinary Foley.

Stomach/Bowel: Stomach is within normal limits. Gastrostomy tube in
place. No evidence of bowel wall thickening, distention, or
inflammatory changes. Formed stool within the rectum which measures
8.7 cm.

Vascular/Lymphatic: Aortic atherosclerosis. No enlarged abdominal or
pelvic lymph nodes.

Reproductive: Prostate is unremarkable.

Other: No abdominal wall hernia or abnormality. No abdominopelvic
ascites.

Musculoskeletal: Spondylosis of the lumbosacral spine.
Osteoarthritic changes versus avascular necrosis of the right hip.
Probable avascular necrosis of the left hip.
IMPRESSION: 1. Mild bibasilar atelectasis versus airspace consolidation.
2. No evidence of acute abnormalities within the solid abdominal
organs.
3. Formed stool within the rectum which is distended to 8.7 cm.
4. Osteoarthritic changes versus avascular necrosis of the right
hip. Probable avascular necrosis of the left hip.

Aortic Atherosclerosis (Y0CL3-BP2.2).

## 2020-01-03 IMAGING — DX PORTABLE CHEST - 1 VIEW
1 series · 1 of 1 positions shown · non-contrast
Comparison: September 01, 2018

CLINICAL DATA: Vomiting.  Concern for aspiration.

EXAM:
PORTABLE CHEST 1 VIEW

[chest]
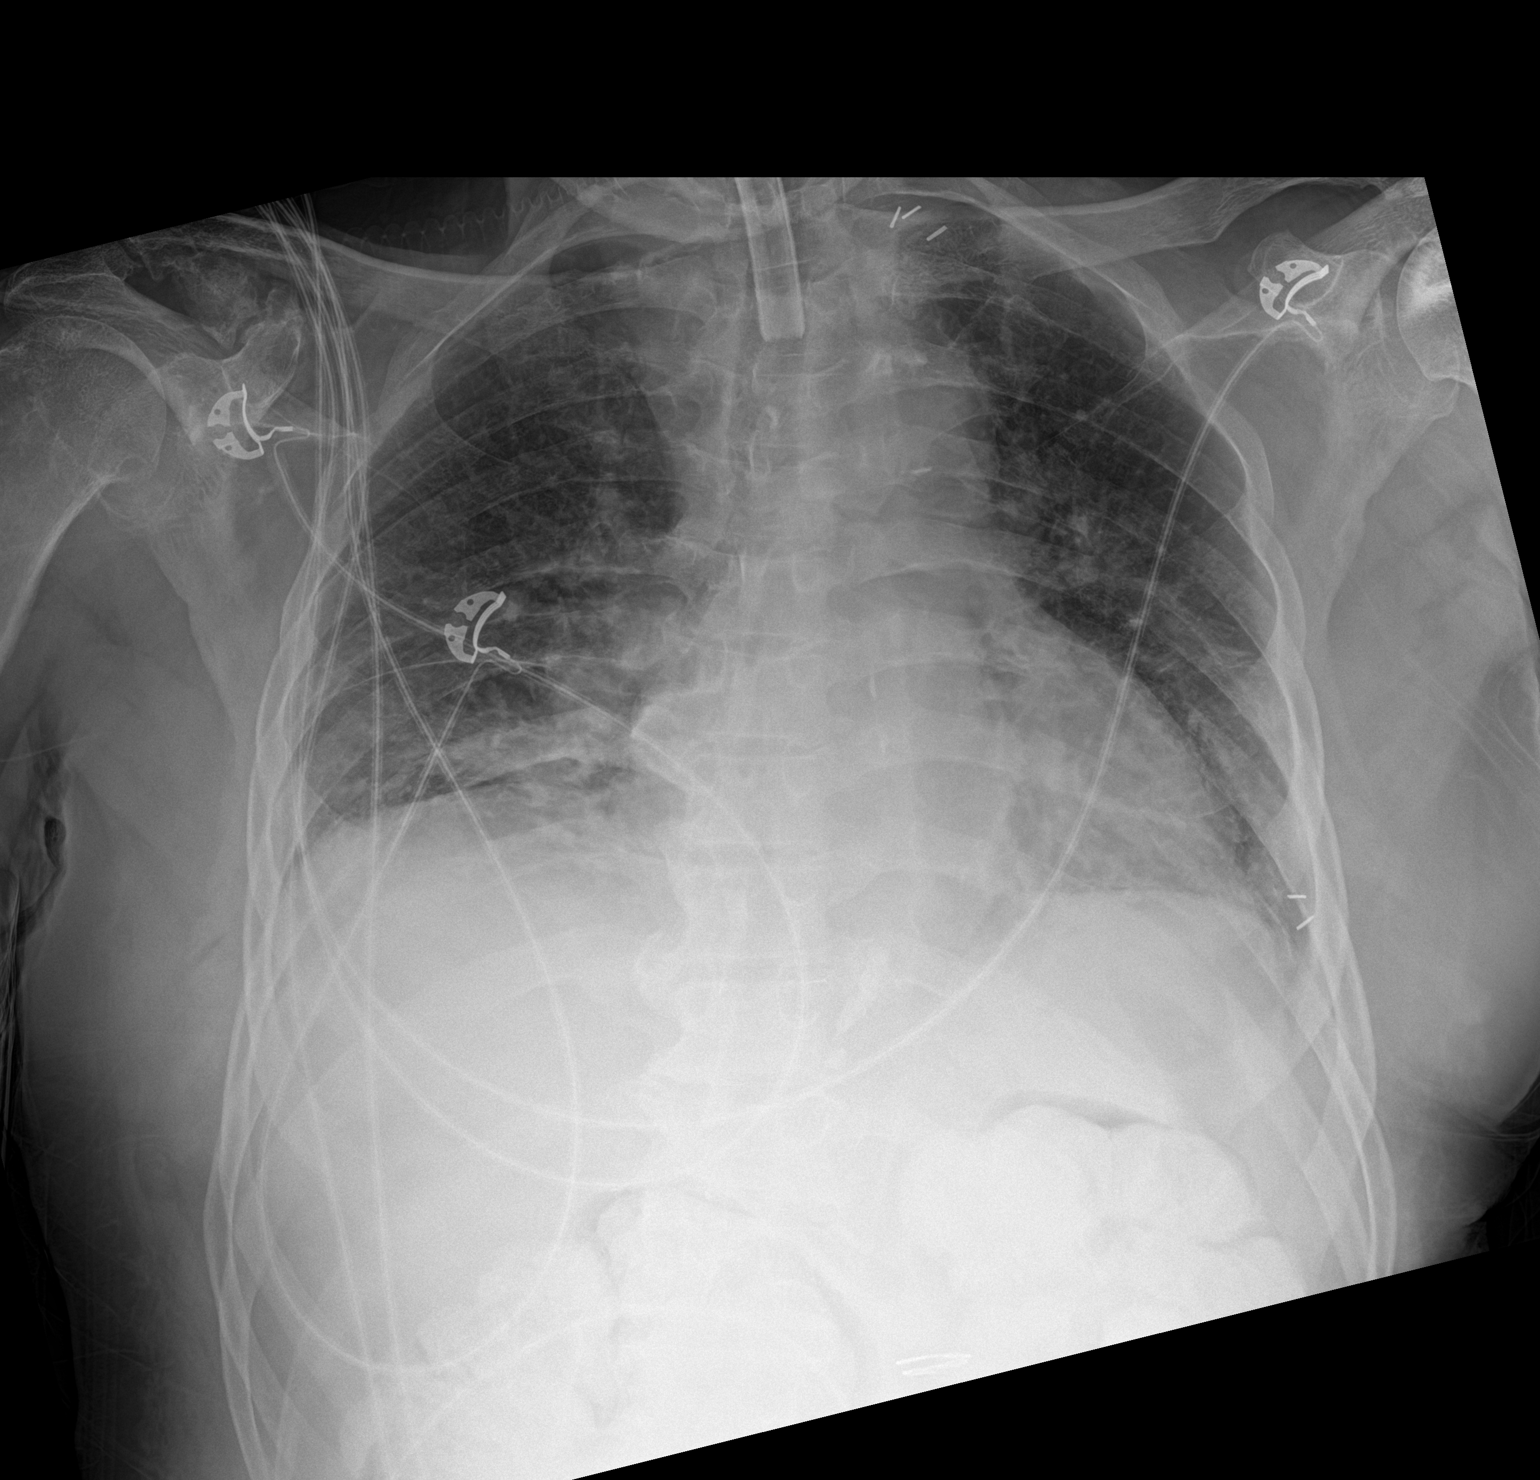

[1 of 1 positions shown; findings below may reference images not displayed]

FINDINGS: The tracheostomy tube terminates above the carina. The heart size is
stable. The lung volumes are low. There are persistent bibasilar
airspace opacities there is no acute osseous abnormality. No
pneumothorax. There may be trace bilateral pleural effusions. There
is a large amount of stool in the partially visualized upper
abdomen.
IMPRESSION: 1. Persistent bibasilar airspace opacities which may represent
chronic atelectasis or aspiration.
2. Stable positioning of the tracheostomy tube.
3. Stable cardiac silhouette.
4. Large amount of stool in the partially visualized transverse
colon.

## 2020-01-14 ENCOUNTER — Encounter (HOSPITAL_COMMUNITY): Payer: Self-pay

## 2020-01-14 ENCOUNTER — Emergency Department (HOSPITAL_COMMUNITY): Payer: Medicare HMO

## 2020-01-14 ENCOUNTER — Other Ambulatory Visit: Payer: Self-pay

## 2020-01-14 ENCOUNTER — Emergency Department (HOSPITAL_COMMUNITY)
Admission: EM | Admit: 2020-01-14 | Discharge: 2020-01-14 | Disposition: A | Discharge status: Home / Self Care | Payer: Medicare HMO | Attending: Emergency Medicine | Admitting: Emergency Medicine

## 2020-01-14 DIAGNOSIS — Z7982 Long term (current) use of aspirin: Secondary | ICD-10-CM | POA: Insufficient documentation

## 2020-01-14 DIAGNOSIS — I129 Hypertensive chronic kidney disease with stage 1 through stage 4 chronic kidney disease, or unspecified chronic kidney disease: Secondary | ICD-10-CM | POA: Diagnosis not present

## 2020-01-14 DIAGNOSIS — E119 Type 2 diabetes mellitus without complications: Secondary | ICD-10-CM | POA: Insufficient documentation

## 2020-01-14 DIAGNOSIS — Z87891 Personal history of nicotine dependence: Secondary | ICD-10-CM | POA: Diagnosis not present

## 2020-01-14 DIAGNOSIS — N183 Chronic kidney disease, stage 3 unspecified: Secondary | ICD-10-CM | POA: Diagnosis not present

## 2020-01-14 DIAGNOSIS — Z79899 Other long term (current) drug therapy: Secondary | ICD-10-CM | POA: Diagnosis not present

## 2020-01-14 DIAGNOSIS — Z431 Encounter for attention to gastrostomy: Secondary | ICD-10-CM | POA: Insufficient documentation

## 2020-01-14 MED ORDER — MORPHINE SULFATE (PF) 4 MG/ML IV SOLN
4.0000 mg | Freq: Once | INTRAVENOUS | Status: AC
  Administered 2020-01-14: 4 mg via INTRAMUSCULAR
  Filled 2020-01-14: qty 1

## 2020-01-14 MED ORDER — IOHEXOL 300 MG/ML  SOLN
30.0000 mL | Freq: Once | INTRAMUSCULAR | Status: AC | PRN
  Administered 2020-01-14: 30 mL

## 2020-01-14 NOTE — ED Notes (Signed)
Report given to [place]

## 2020-01-14 NOTE — ED Triage Notes (Signed)
EMS reports from [facility], staff called out for  PEG tube completely dislodged an hour ago, and not provided by staff. Pt denies pain or any other issue. Pt has Trach.  BP 160/90 HR 70 RR 16 Sp02 96 6 ltrs in Trach collar 24-7 Temp 98

## 2020-01-14 NOTE — ED Notes (Signed)
[facility] called for transportation back to [facility]

## 2020-01-14 NOTE — ED Provider Notes (Signed)
[facility] DEPT Provider Note   CSN: [id number] Arrival date & time: 01/14/20  1733     History Chief Complaint  Patient presents with  . PEG tube dislodged    Scott Rivera is a 69 y.o. male.  HPI      68yo male with complex medical history below presents from [facility] with concern for removal of gastrostomy tube.  He has trach in place and is difficult to understand. Denies abdominal pain, chest pain, dyspnea.  Called facility who also expresses no other concerns.  GTube came out about 1 hour ago.  No other concerns. Was 29F.  Past Medical History:  Diagnosis Date  . Acute pulmonary edema (HCC)   . Anemia due to chronic kidney disease   . ARF (acute renal failure) ([place]) 10/19/2016  . Aspiration pneumonia ([place])   . Cerebellar infarct ([place]) 10/19/2016  . Chronic kidney disease, stage 3 (moderate)   . CKD (chronic kidney disease) stage 2, GFR 60-89 ml/min 01/02/2017  . 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 Gastrostomy tube obstruction ([place])   . HCAP (healthcare-associated pneumonia)   . Helicobacter pylori infection 09/21/2012  . History of adenomatous polyp of colon 07/28/2015  . Hypertension   . MDR Acinetobacter baumannii infection   . Pneumothorax, closed, traumatic, initial encounter 12/10/2016  . Renal insufficiency   . Rhabdomyolysis 10/19/2016  . S/P percutaneous endoscopic gastrostomy (PEG) tube placement ([place]) 03/30/2017  . Stage III pressure ulcer of sacral region ([place]) 12/17/2016  . Stroke ([place])   . UTI (urinary tract infection) 03/30/2017    Patient Active Problem List   Diagnosis Date Noted  . Acute kidney injury ([place]) 02/05/2019  . Left renal atrophy 02/05/2019  . Acute-on-chronic kidney injury ([place]) 02/04/2019  . Hematemesis 01/14/2019  . Nausea vomiting and diarrhea 01/13/2019  . HCAP (healthcare-associated pneumonia) 01/08/2019  . Hyperkalemia 01/08/2019  . Aspiration pneumonia ([place])  01/01/2019  . GI bleed 12/31/2018  . Pneumoperitoneum 04/09/2018  . Perforated viscus 04/09/2018  . 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 Aspiration of gastric contents   . Malnutrition of moderate degree 11/19/2017  . Aspiration pneumonia of right lower lobe ([place]) 11/16/2017  . Nausea and vomiting 11/16/2017  . Tracheostomy tube present ([place]) 09/25/2017  . Chronic respiratory insufficiency 07/24/2017  . Hematuria 06/26/2017  . Foley catheter in place on admission 03/30/2017  . UTI (urinary tract infection) 03/30/2017  . Diabetes mellitus ([place]) 03/14/2017  . History of tracheostomy 03/14/2017  . Percutaneous endoscopic gastrostomy status ([place]) 03/14/2017  . Gastroesophageal reflux disease without esophagitis 03/14/2017  . Acute respiratory failure with hypoxia ([place]) 03/02/2017  . Loculated pleural effusion   . CKD (chronic kidney disease) stage 2, GFR 60-89 ml/min 01/02/2017  . Fever   . Sepsis ([place]) 12/09/2016  . Anemia due to chronic kidney disease 12/09/2016  . Compensated metabolic alkalosis 40/34/7425  . Goals of care, counseling/discussion   . Palliative care by specialist   . Aspiration into airway   . Dysphagia, post-stroke   . Tachypnea   . Leukocytosis   . Acute blood loss anemia   . Pressure injury of skin 10/20/2016  . Essential hypertension 10/19/2016  . History of cerebral infarction 10/19/2016  . Bilateral chronic knee pain 01/07/2016  . Tobacco abuse 12/23/2014  . Mixed hyperlipidemia 07/28/2013    Past Surgical History:  Procedure Laterality Date  . BIOPSY  01/01/2019   Procedure: BIOPSY;  Surgeon:  Mansouraty, Telford Nab., MD;  Location: [facility];  Service: Gastroenterology;;  . ESOPHAGOGASTRODUODENOSCOPY (EGD) WITH PROPOFOL N/A 01/01/2019   Procedure: ESOPHAGOGASTRODUODENOSCOPY (EGD) WITH PROPOFOL;  Surgeon: Irving Copas., MD;  Location: [facility];  Service: Gastroenterology;  Laterality: N/A;  .  IR GASTROSTOMY TUBE MOD SED  10/27/2016  . IR PATIENT EVAL TECH 0-60 MINS  01/05/2017  . IR PATIENT EVAL TECH 0-60 MINS  01/09/2017  . IR PATIENT EVAL TECH 0-60 MINS  03/03/2017  . IR PATIENT EVAL TECH 0-60 MINS  04/03/2017  . IR REPLACE G-TUBE SIMPLE WO FLUORO  11/20/2017  . IR Roy TUBE PERCUT W/FLUORO  07/23/2017  . KNEE SURGERY    . TRACHEOSTOMY TUBE PLACEMENT         Family History  Problem Relation Age of Onset  . Diabetes Mellitus II Brother   . CAD Neg Hx   . Stroke Neg Hx     Social History   Tobacco Use  . Smoking status: Former Research scientist (life sciences)  . Smokeless tobacco: Never Used  . Tobacco comment: Smoked heavily until the day of his stroke  Vaping Use  . Vaping Use: Never used  Substance Use Topics  . Alcohol use: Not Currently  . Drug use: Not Currently    Home Medications Prior to Admission medications   Medication Sig Start Date End Date Taking? Authorizing Provider  acetaminophen (TYLENOL) 500 MG tablet Place 1,000 mg into feeding tube 3 (three) times daily.     [provider]  amLODipine (NORVASC) 10 MG tablet Place 1 tablet (10 mg total) into feeding tube daily. 11/23/17   Kayleen Memos, DO  aspirin EC 81 MG tablet 81 mg daily. Per Feeding tube    [provider]  cloNIDine (CATAPRES - DOSED IN MG/24 HR) 0.1 mg/24hr patch Place 1 patch (0.1 mg total) onto the skin every Tuesday. 01/21/19   Georgette Shell, MD  clopidogrel (PLAVIX) 75 MG tablet Place 1 tablet (75 mg total) into feeding tube daily. 01/04/19   Mariel Aloe, MD  Ferrous Sulfate (IRON) 325 (65 Fe) MG TABS Place 325 mg into feeding tube daily.     [provider]  gabapentin (NEURONTIN) 100 MG capsule 100 mg 3 (three) times daily. Per Feeding tube    [provider]  glycopyrrolate (ROBINUL) 1 MG tablet Place 1 mg into feeding tube 2 (two) times daily.  11/18/18   [provider]  Lactulose 20 GM/30ML SOLN Place 30 mLs (20 g total) into feeding tube  daily as needed (for constipation). 02/08/19   Donne Hazel, MD  metoprolol tartrate (LOPRESSOR) 50 MG tablet Take 1 tablet (50 mg total) by mouth 2 (two) times daily. 12/21/17   Thurnell Lose, MD  mirtazapine (REMERON) 15 MG tablet Place 7.5 mg into feeding tube at bedtime.  12/24/18   [provider]  montelukast (SINGULAIR) 10 MG tablet Place 10 mg into feeding tube daily.     [provider]  Nutritional Supplements (FEEDING SUPPLEMENT, JEVITY 1.2 CAL,) LIQD Place 60 mLs into feeding tube continuous.    [provider]  pravastatin (PRAVACHOL) 20 MG tablet Place 1 tablet (20 mg total) into feeding tube daily at 6 PM. Patient taking differently: Place 20 mg daily into feeding tube.  01/10/17   Cherene Altes, MD  vitamin C (ASCORBIC ACID) 500 MG tablet Place 500 mg into feeding tube daily.     [provider]    Allergies  Patient has no known allergies.  Review of Systems   Review of Systems  Constitutional: Negative for fever.  Respiratory: Negative for cough and shortness of breath.   Cardiovascular: Negative for chest pain.  Gastrointestinal: Negative for abdominal pain, nausea and vomiting.    Physical Exam Updated Vital Signs BP 131/62   Pulse 69   Temp 97.9 F (36.6 C) (Oral)   Resp 18   SpO2 90%   Physical Exam Vitals and nursing note reviewed.  Constitutional:      General: He is not in acute distress.    Appearance: Normal appearance. He is not ill-appearing, toxic-appearing or diaphoretic.  HENT:     Head: Normocephalic.  Eyes:     Conjunctiva/sclera: Conjunctivae normal.  Cardiovascular:     Rate and Rhythm: Normal rate and regular rhythm.     Pulses: Normal pulses.  Pulmonary:     Effort: Pulmonary effort is normal. No respiratory distress.     Comments: Tracheostomy in place/on O2 Abdominal:     General: Abdomen is flat. There is no distension.     Palpations: Abdomen is soft. There is no mass.      Tenderness: There is no abdominal tenderness. There is no guarding or rebound.     Hernia: No hernia is present.     Comments: Gastrostomy without tube present, small amount of blood around site  Musculoskeletal:        General: No deformity or signs of injury.     Cervical back: No rigidity.  Skin:    General: Skin is warm and dry.     Coloration: Skin is not jaundiced or pale.  Neurological:     General: No focal deficit present.     Mental Status: He is alert and oriented to person, place, and time.     ED Results / Procedures / Treatments   Labs (all labs ordered are listed, but only abnormal results are displayed) Labs Reviewed - No data to display  EKG None  Radiology DG Abdomen 1 View  Result Date: 01/14/2020 CLINICAL DATA:  Percutaneous gastrostomy tube placement. EXAM: ABDOMEN - 1 VIEW COMPARISON:  None. FINDINGS: The bowel gas pattern is normal. A percutaneous gastrostomy tube is seen with its distal tip overlying the gastric antrum. Radiopaque contrast is noted throughout the stomach after injection into the gastrostomy tube. There is no evidence of free air or contrast extravasation. No radio-opaque calculi or other significant radiographic abnormality are seen. Radiopaque surgical clips are seen along the midline of the mid to lower abdomen and pelvis. IMPRESSION: Percutaneous gastrostomy tube positioning, as described above. Electronically Signed   By: Virgina Norfolk M.D.   On: 01/14/2020 19:37    Procedures Gastrostomy tube replacement  Date/Time: 01/15/2020 11:17 AM Performed by: Gareth Morgan, MD Authorized by: Gareth Morgan, MD  Preparation: Patient was prepped and draped in the usual sterile fashion. Local anesthesia used: no  Anesthesia: Local anesthesia used: no  Sedation: Patient sedated: no  Patient tolerance: patient tolerated the procedure well with no immediate complications Comments: Attempted 50F initially without success Able to  insert 40F, after 40F inserted able to switch to 50F with     (including critical care time)  Medications Ordered in ED Medications  morphine 4 MG/ML injection 4 mg (4 mg Intramuscular Given 01/14/20 1821)  iohexol (OMNIPAQUE) 300 MG/ML solution 30 mL (30 mLs Per Tube Contrast Given 01/14/20 1912)    ED Course  I have reviewed the triage vital signs and the  nursing notes.  Pertinent labs & imaging results that were available during my care of the patient were reviewed by me and considered in my medical decision making (see chart for details).    MDM Rules/Calculators/A&P                          69yo male with complex medical history below presents from [facility] with concern for removal of gastrostomy tube.  Facility attempted replacement without success.  Successful placed 15F GT and switched out to 2F. XR confirms placement as well as aspiration of stomach contents, auscultation of air.  Uncomplicated placement.  No other acute medical concerns.  Patient discharged in stable condition with understanding of reasons to return.    Final Clinical Impression(s) / ED Diagnoses Final diagnoses:  Attention to gastrostomy [facility])    Rx / DC Orders ED Discharge Orders    None       Gareth Morgan, MD 01/15/20 1520

## 2020-02-09 IMAGING — DX DG CHEST 1V PORT
1 series · 1 of 1 positions shown · non-contrast
Comparison: 11/24/2018.

CLINICAL DATA: Increased congestion.

EXAM:
PORTABLE CHEST 1 VIEW

[chest ap]
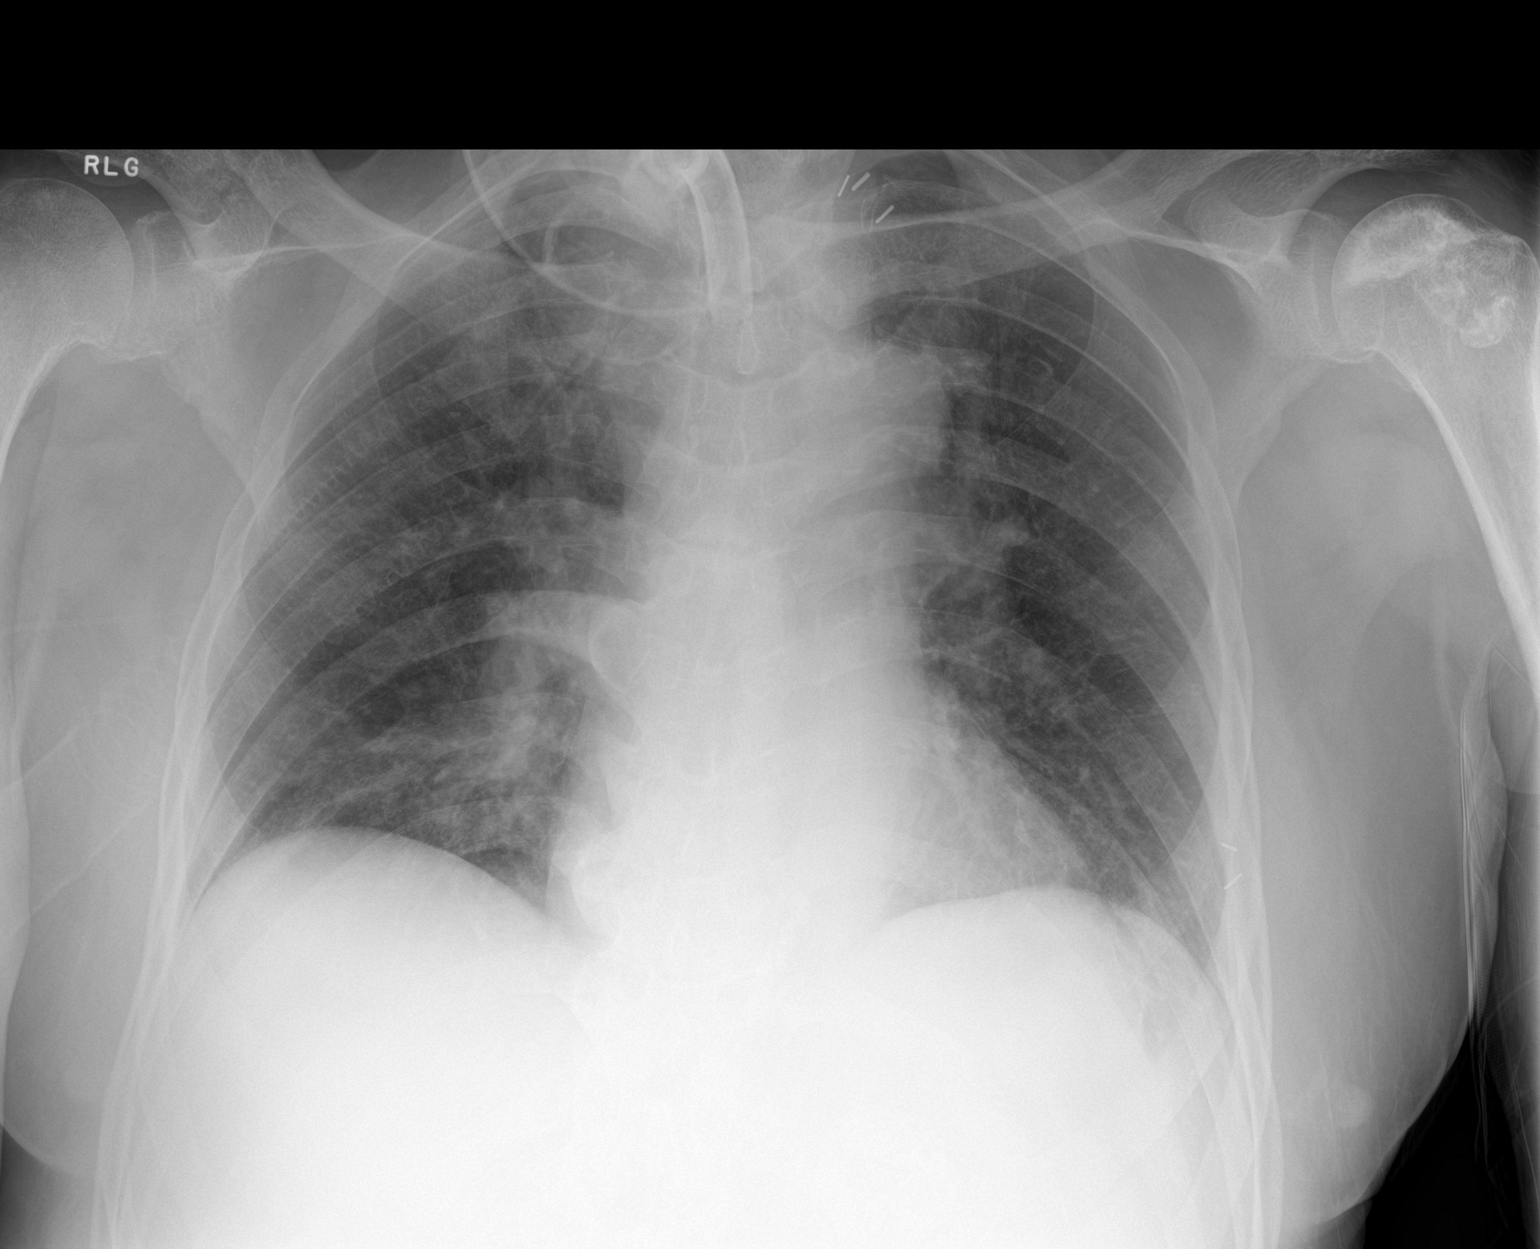

[1 of 1 positions shown; findings below may reference images not displayed]

FINDINGS: Tracheostomy tube stable position. Heart size normal. Low lung
volumes with mild bibasilar atelectasis/infiltrates. No pleural
effusion or pneumothorax. Surgical clips left upper scratched it
surgical clips left chest. Sclerotic changes left humeral head
consistent with a vascular necrosis.
IMPRESSION: 1.  Tracheostomy tube stable position.

2.  Low lung volumes with mild bibasilar atelectasis/infiltrates.

3. Sclerotic changes left humeral head consistent with avascular
necrosis.

## 2020-02-09 IMAGING — DX DG ABDOMEN 1V
1 series · 1 of 1 positions shown · non-contrast
Comparison: Abdominal radiograph dated 11/18/2018

CLINICAL DATA: 68-year-old male with PEG tube. Evaluate for
patency.

EXAM:
ABDOMEN - 1 VIEW

[abdomen kub]
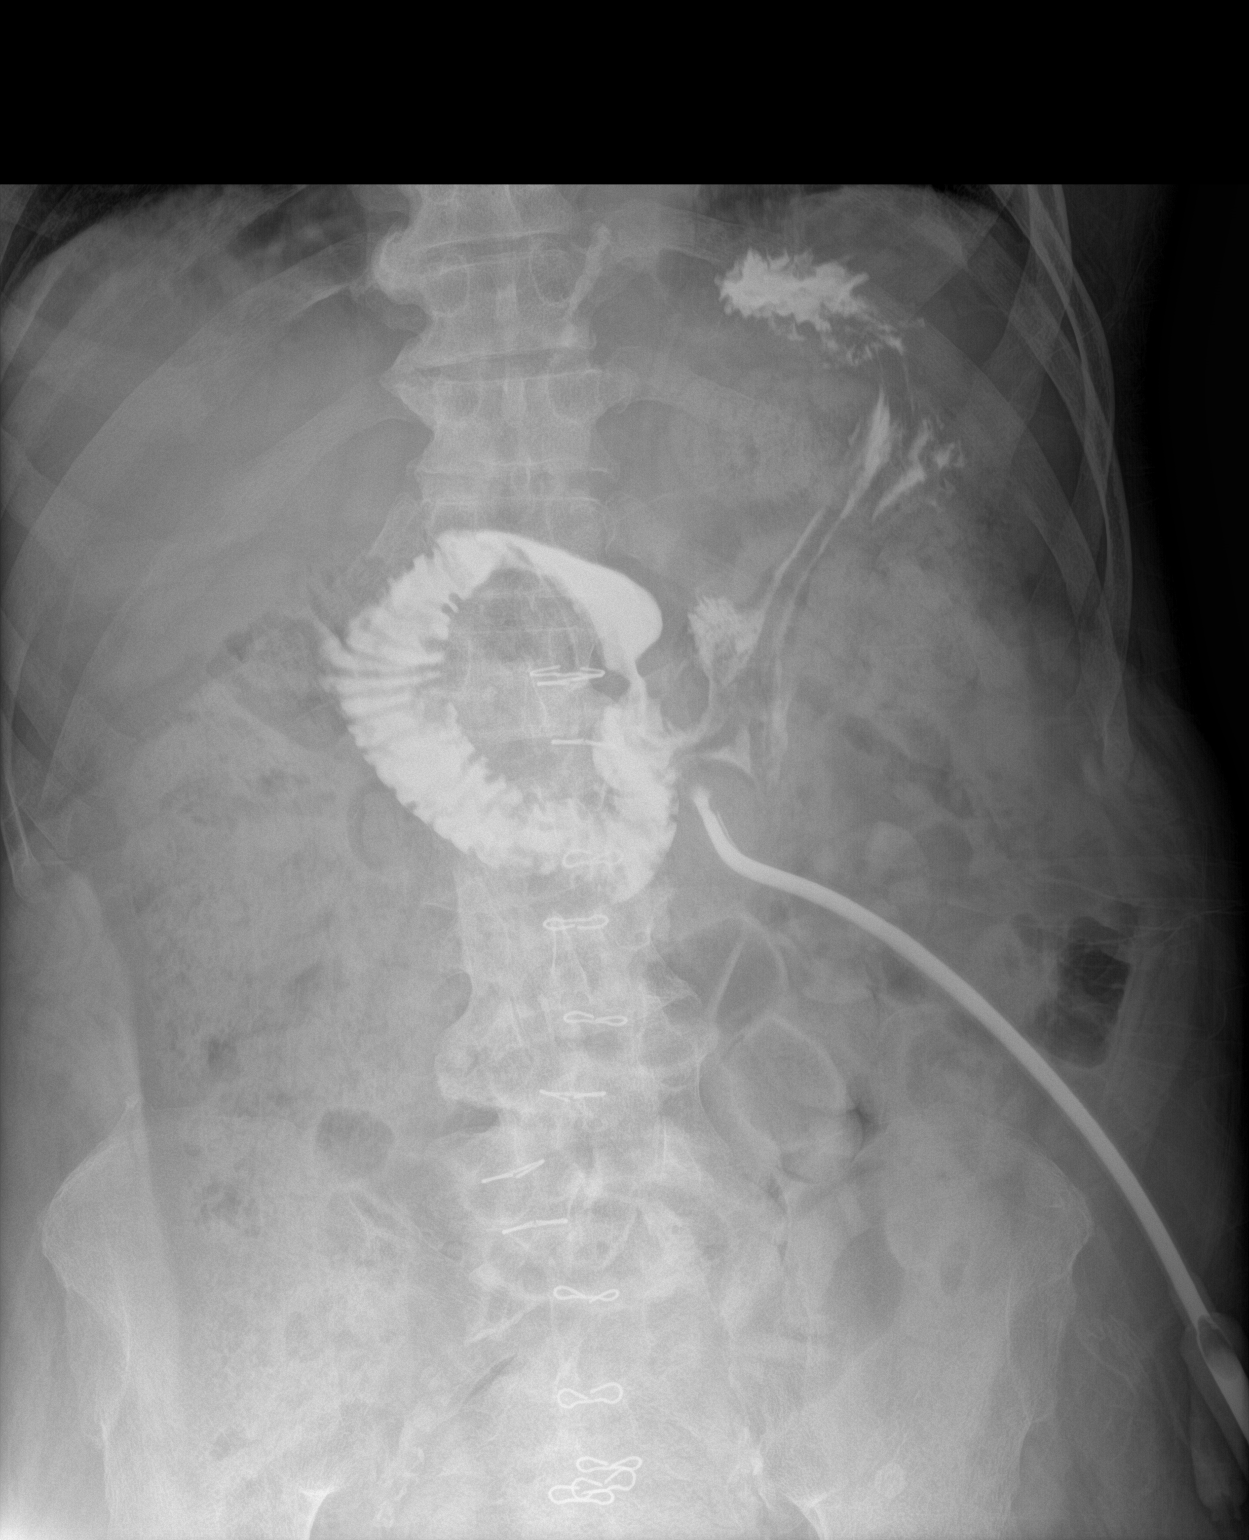

[1 of 1 positions shown; findings below may reference images not displayed]

FINDINGS: Contrast injected via the percutaneous gastrostomy is noted within
the stomach and duodenal C-loop. No extraluminal contrast
identified. There is large amount of stool throughout the colon. No
bowel dilatation. There is degenerative changes of the spine.
Midline surgical staples noted.
IMPRESSION: Percutaneous gastrostomy within the distal stomach. No extraluminal
contrast identified.

## 2020-02-10 IMAGING — DX DG CHEST 1V PORT
1 series · 1 of 1 positions shown · non-contrast
Comparison: Chest x-ray from yesterday.

CLINICAL DATA: Pneumonia.

EXAM:
PORTABLE CHEST 1 VIEW

[chest ap]
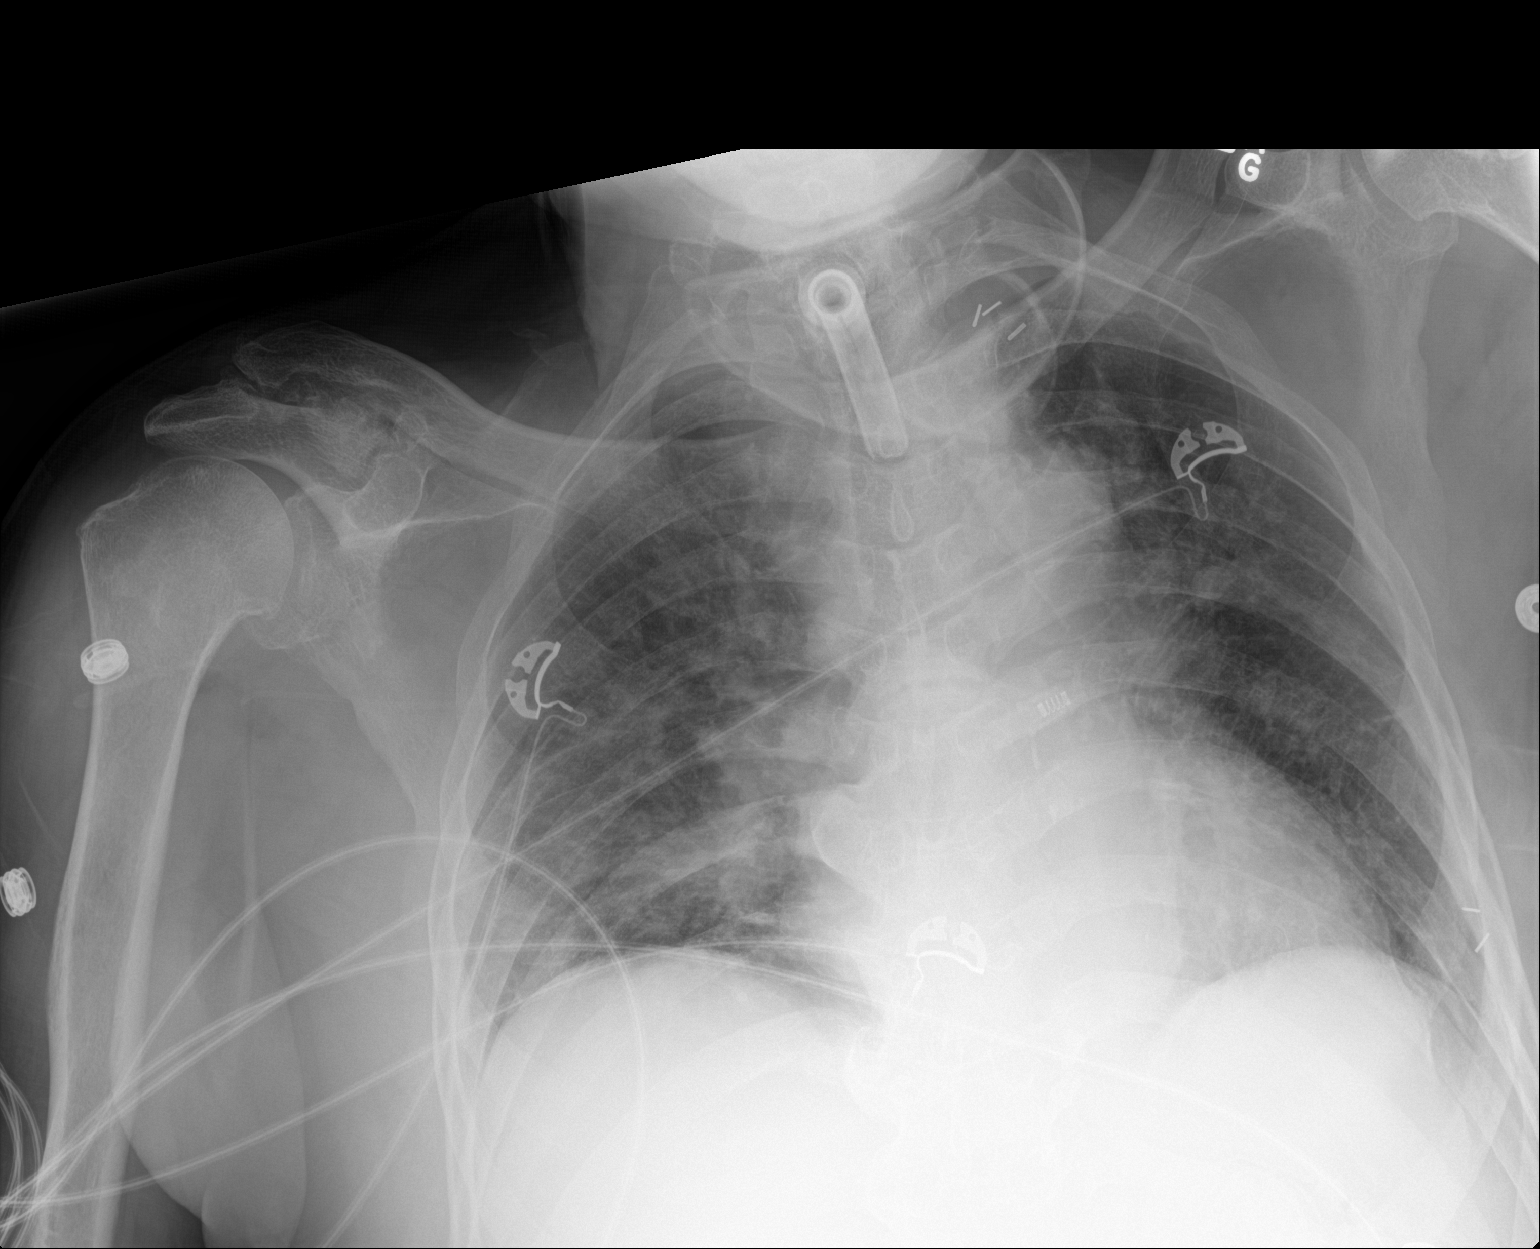

[1 of 1 positions shown; findings below may reference images not displayed]

FINDINGS: Unchanged tracheostomy tube with tip at the thoracic inlet. The
heart size and mediastinal contours are within normal limits. New
mild pulmonary vascular congestion. Unchanged opacity at the medial
right lung base. No pleural effusion or pneumothorax. No acute
osseous abnormality. Unchanged avascular necrosis of the left
humeral head.
IMPRESSION: 1. New mild pulmonary vascular congestion.
2. Unchanged right medial basilar atelectasis versus infiltrate.

## 2020-02-17 IMAGING — CR DG CHEST 2V
2 series · 2 of 2 positions shown · non-contrast
Comparison: Chest radiograph 01/01/2019, 12/31/2018

CLINICAL DATA: Pt was admitted from [DATE]-[DATE] with an upper GI
bleed. He underwent an endoscopy on [DATE]. Sister reported that she
was notified that pt had "abnormal abdominal results" from the
gastrologist.

EXAM:
CHEST - 2 VIEW

[w chest lat]
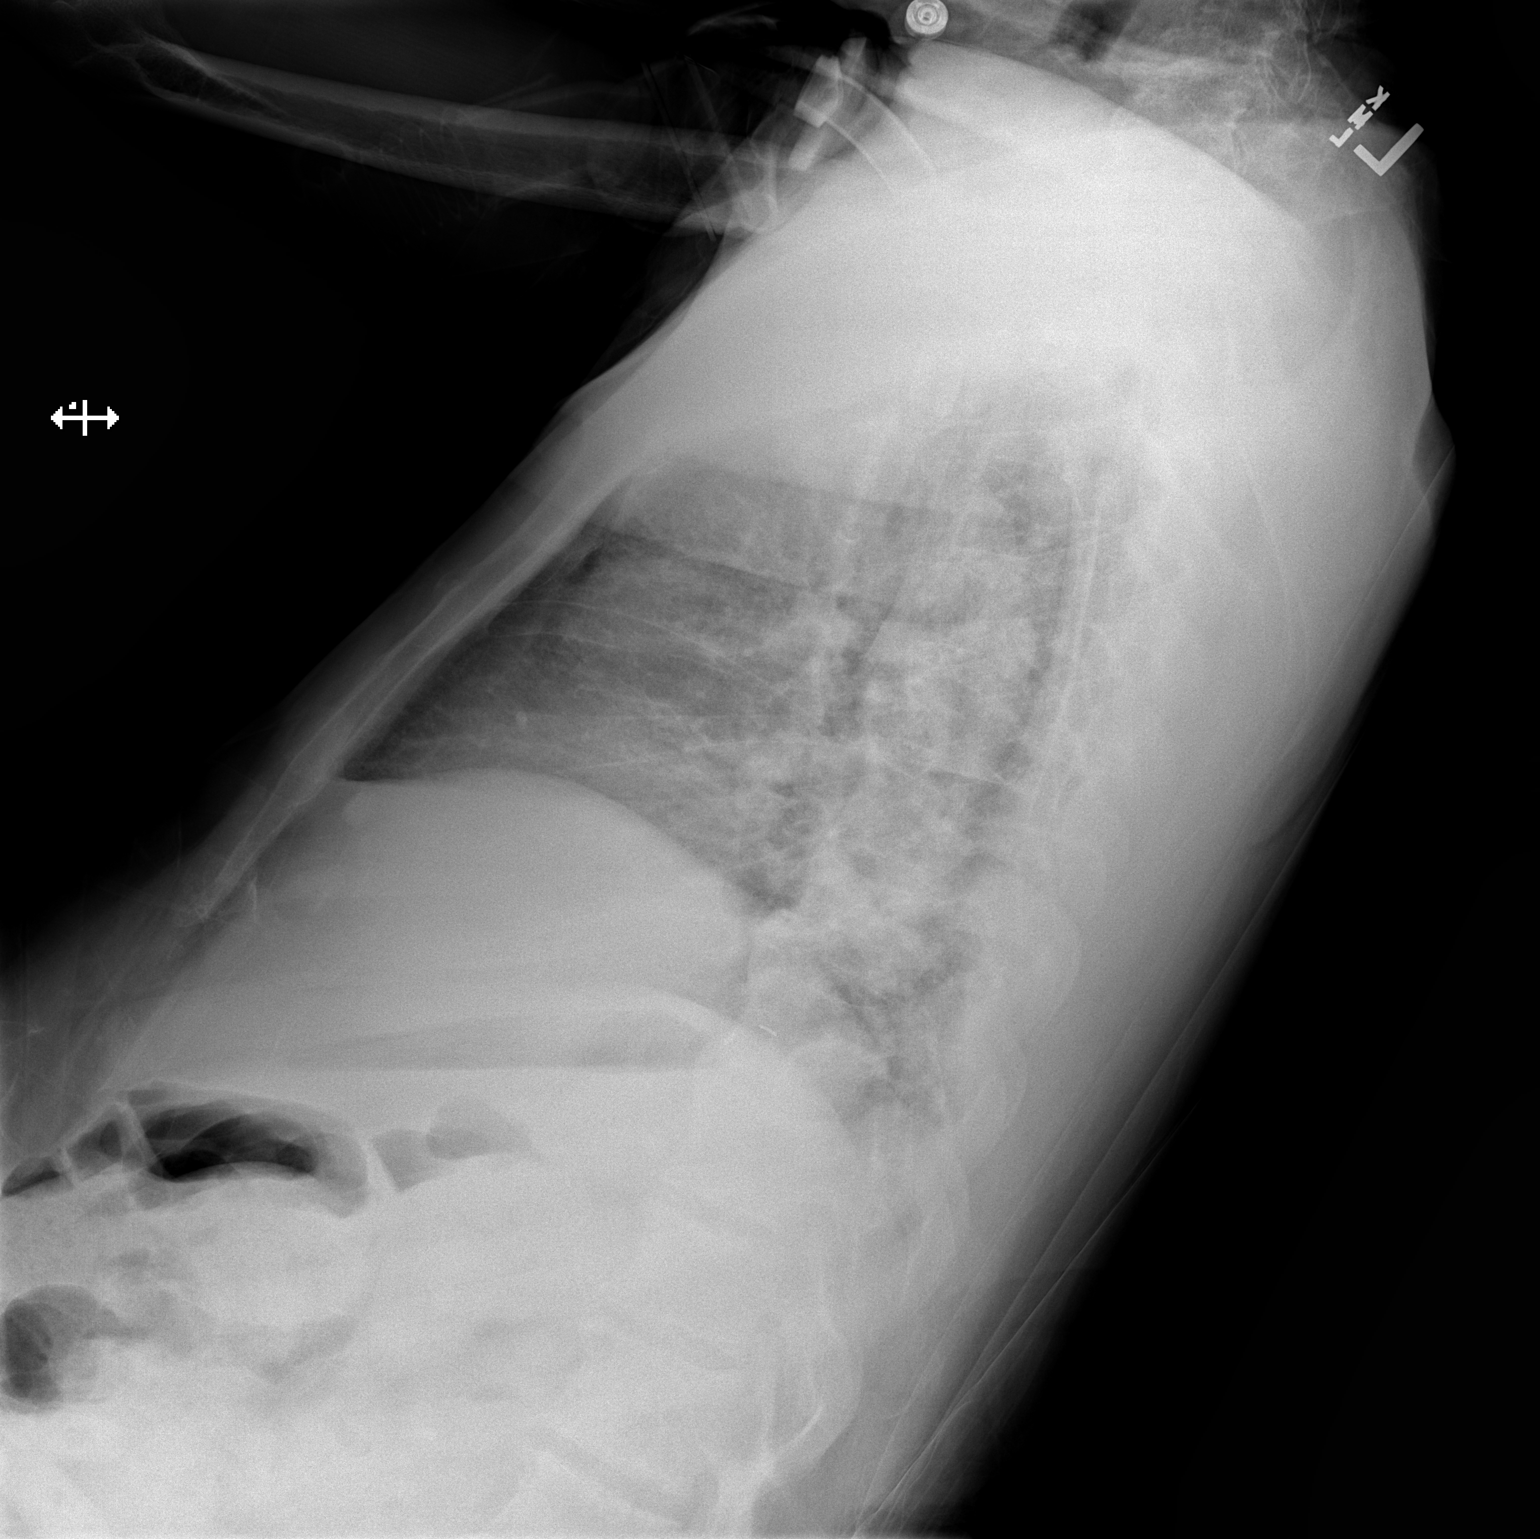

[x chest ap]
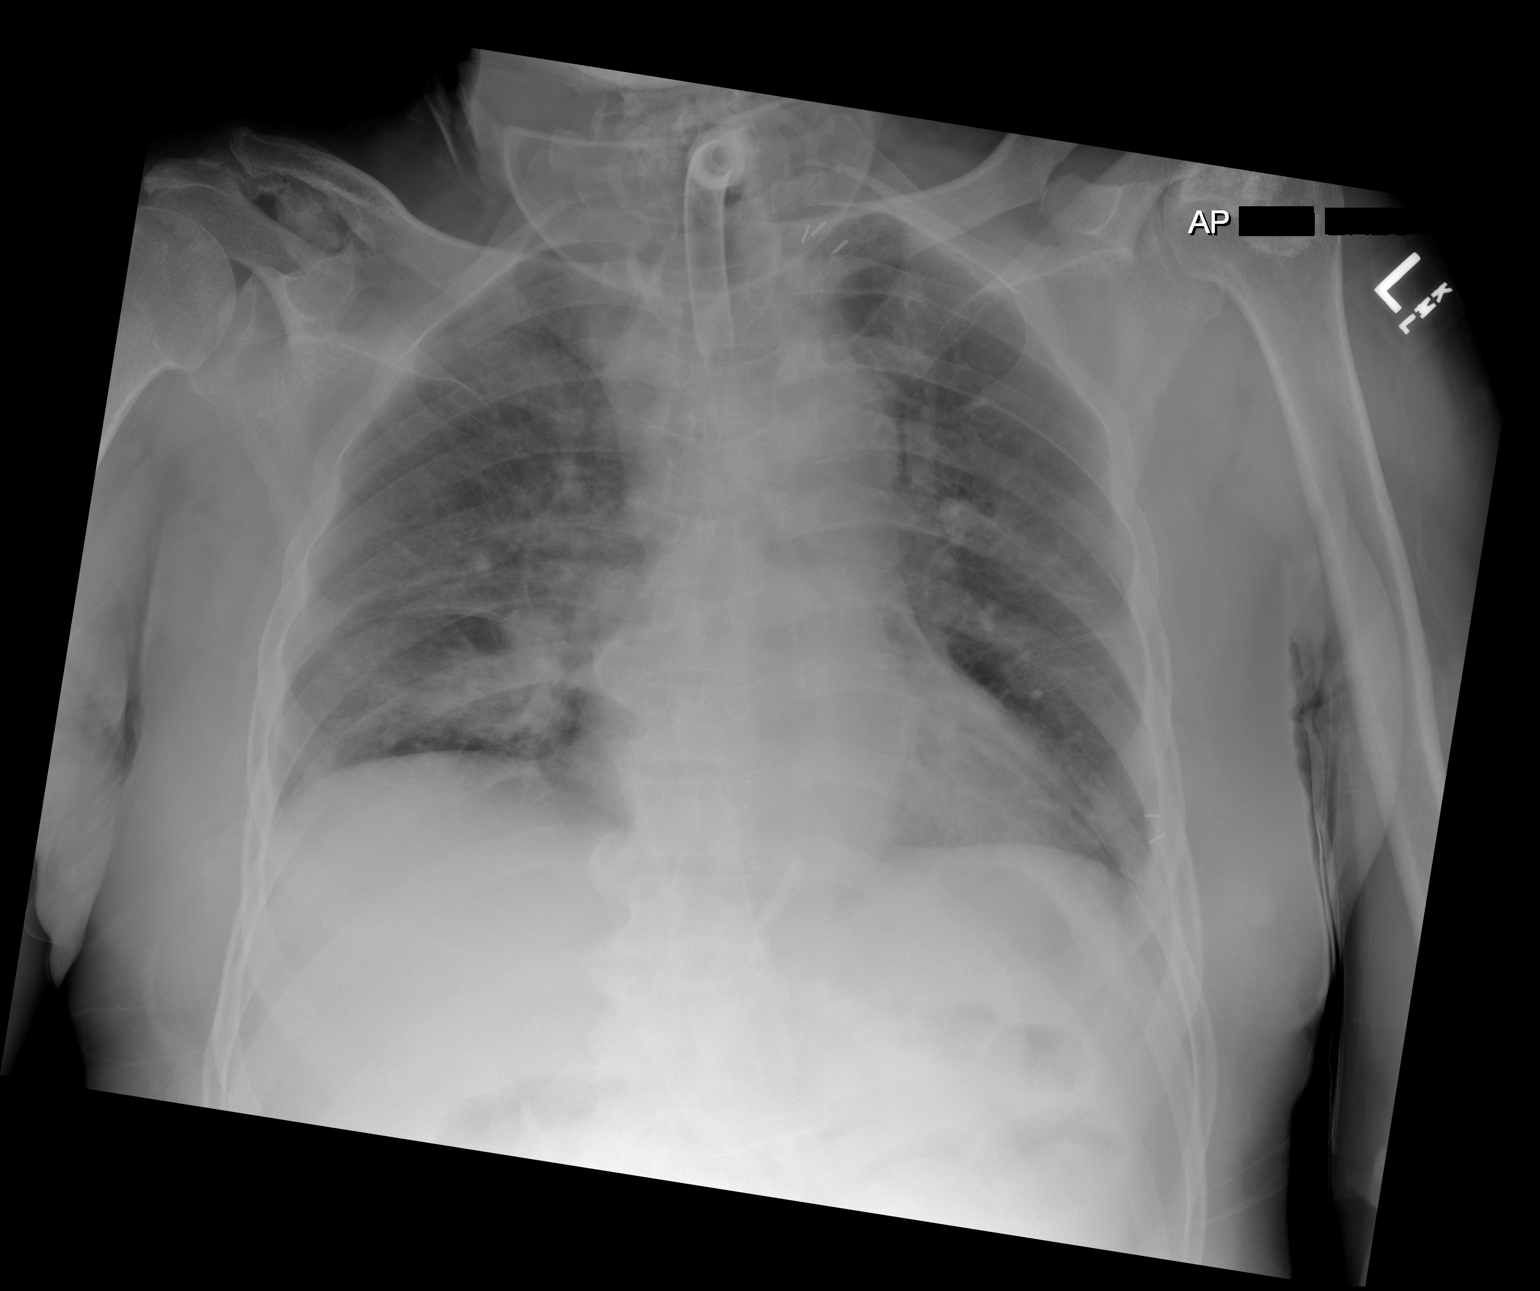

[2 of 2 positions shown; findings below may reference images not displayed]

FINDINGS: Tracheostomy in place. Stable cardiomediastinal contours. Central
vascular congestion with diffuse bilateral prominent interstitial
markings. Low volume study. Slightly increased opacity at the medial
right lung base suspicious for infection. Scattered atelectasis no
pneumothorax or large pleural effusion. No acute finding in the
visualized skeleton.
IMPRESSION: 1. Mildly increased opacity at the medial right lung base suspicious
for infection.

2. Central vascular congestion with probable trace edema and
atelectasis.

## 2020-02-22 IMAGING — DX DG CHEST 1V PORT
1 series · 1 of 1 positions shown · non-contrast
Comparison: January 08, 2019

CLINICAL DATA: Aspiration.  Shortness of breath.

EXAM:
PORTABLE CHEST 1 VIEW

[chest ap]
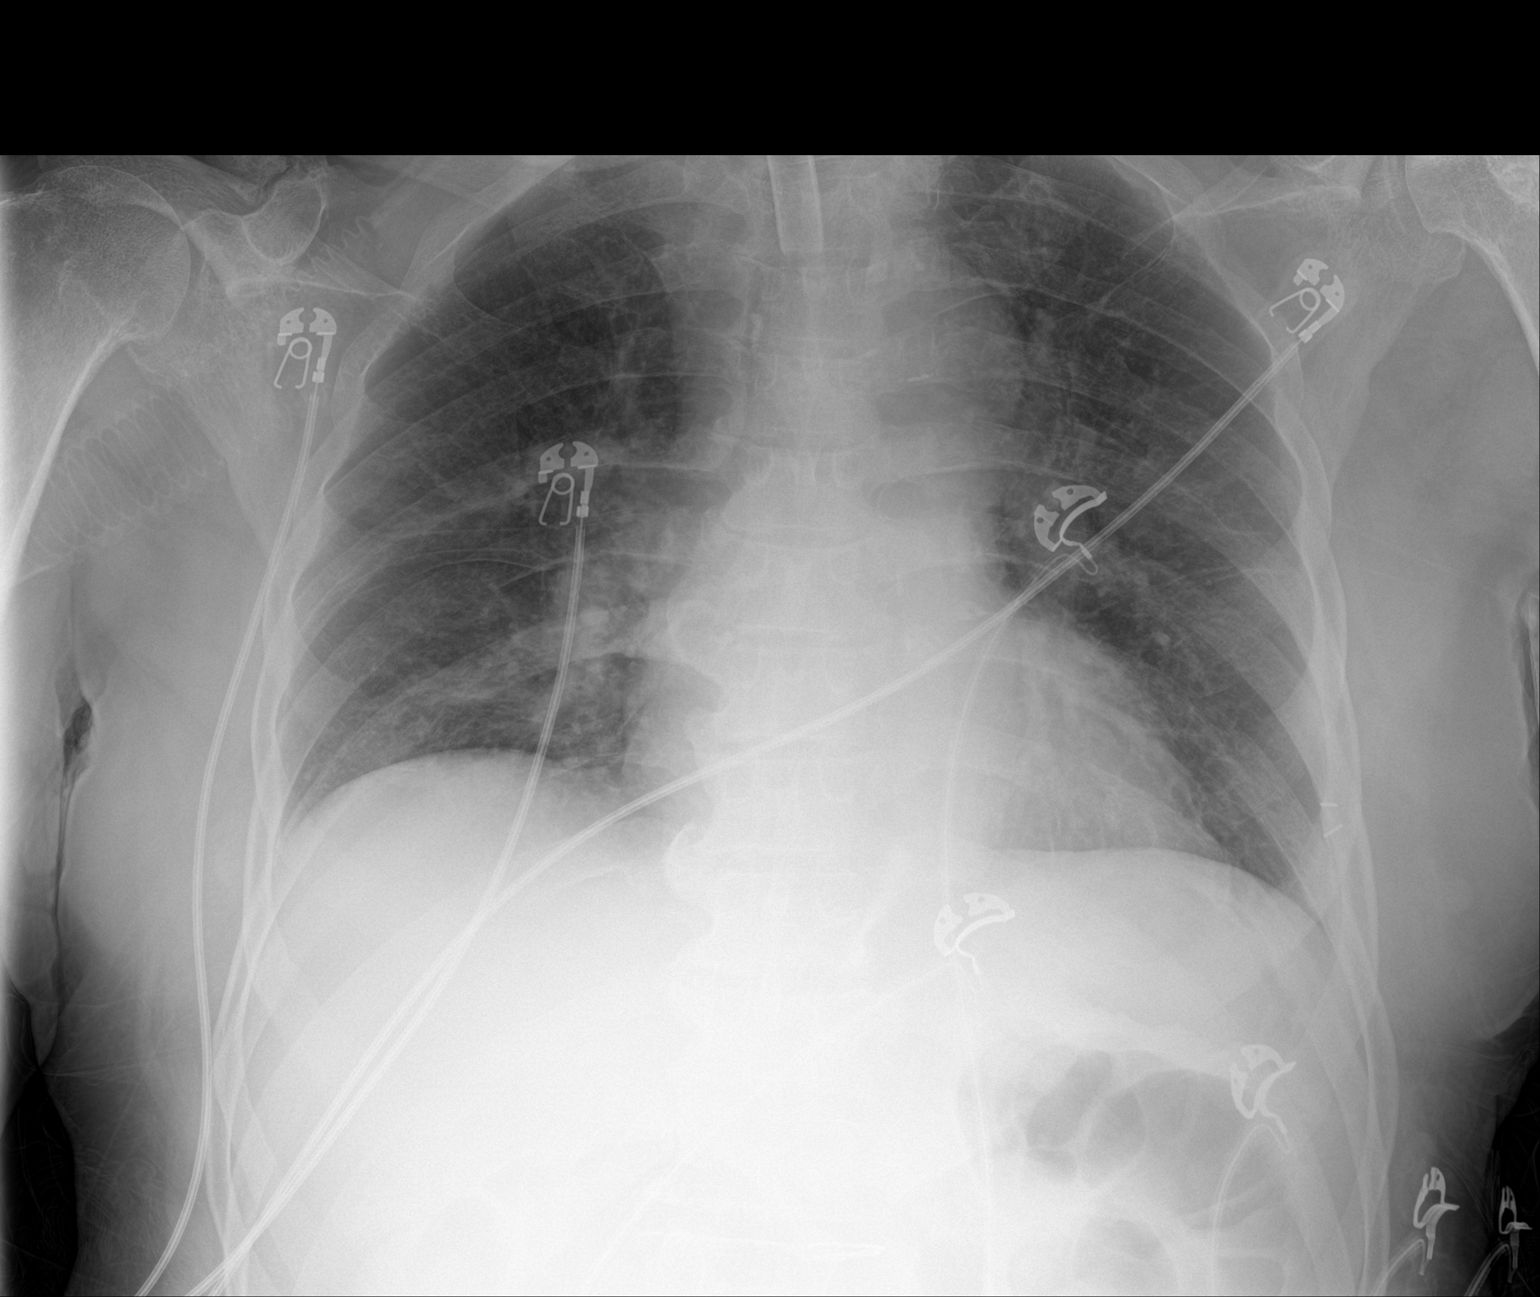

[1 of 1 positions shown; findings below may reference images not displayed]

FINDINGS: A tracheostomy tube is stable. The cardiomediastinal silhouette is
stable. No pneumothorax. No nodules or masses. Vascular crowding in
the medial right lung base is stable. No focal infiltrate.
IMPRESSION: No active disease.

## 2020-03-15 IMAGING — DX DG CHEST 1V PORT
1 series · 1 of 1 positions shown · non-contrast
Comparison: 01/21/2019

CLINICAL DATA: Decreased oxygen saturation, history of
tracheostomy.

EXAM:
PORTABLE CHEST 1 VIEW

[chest]
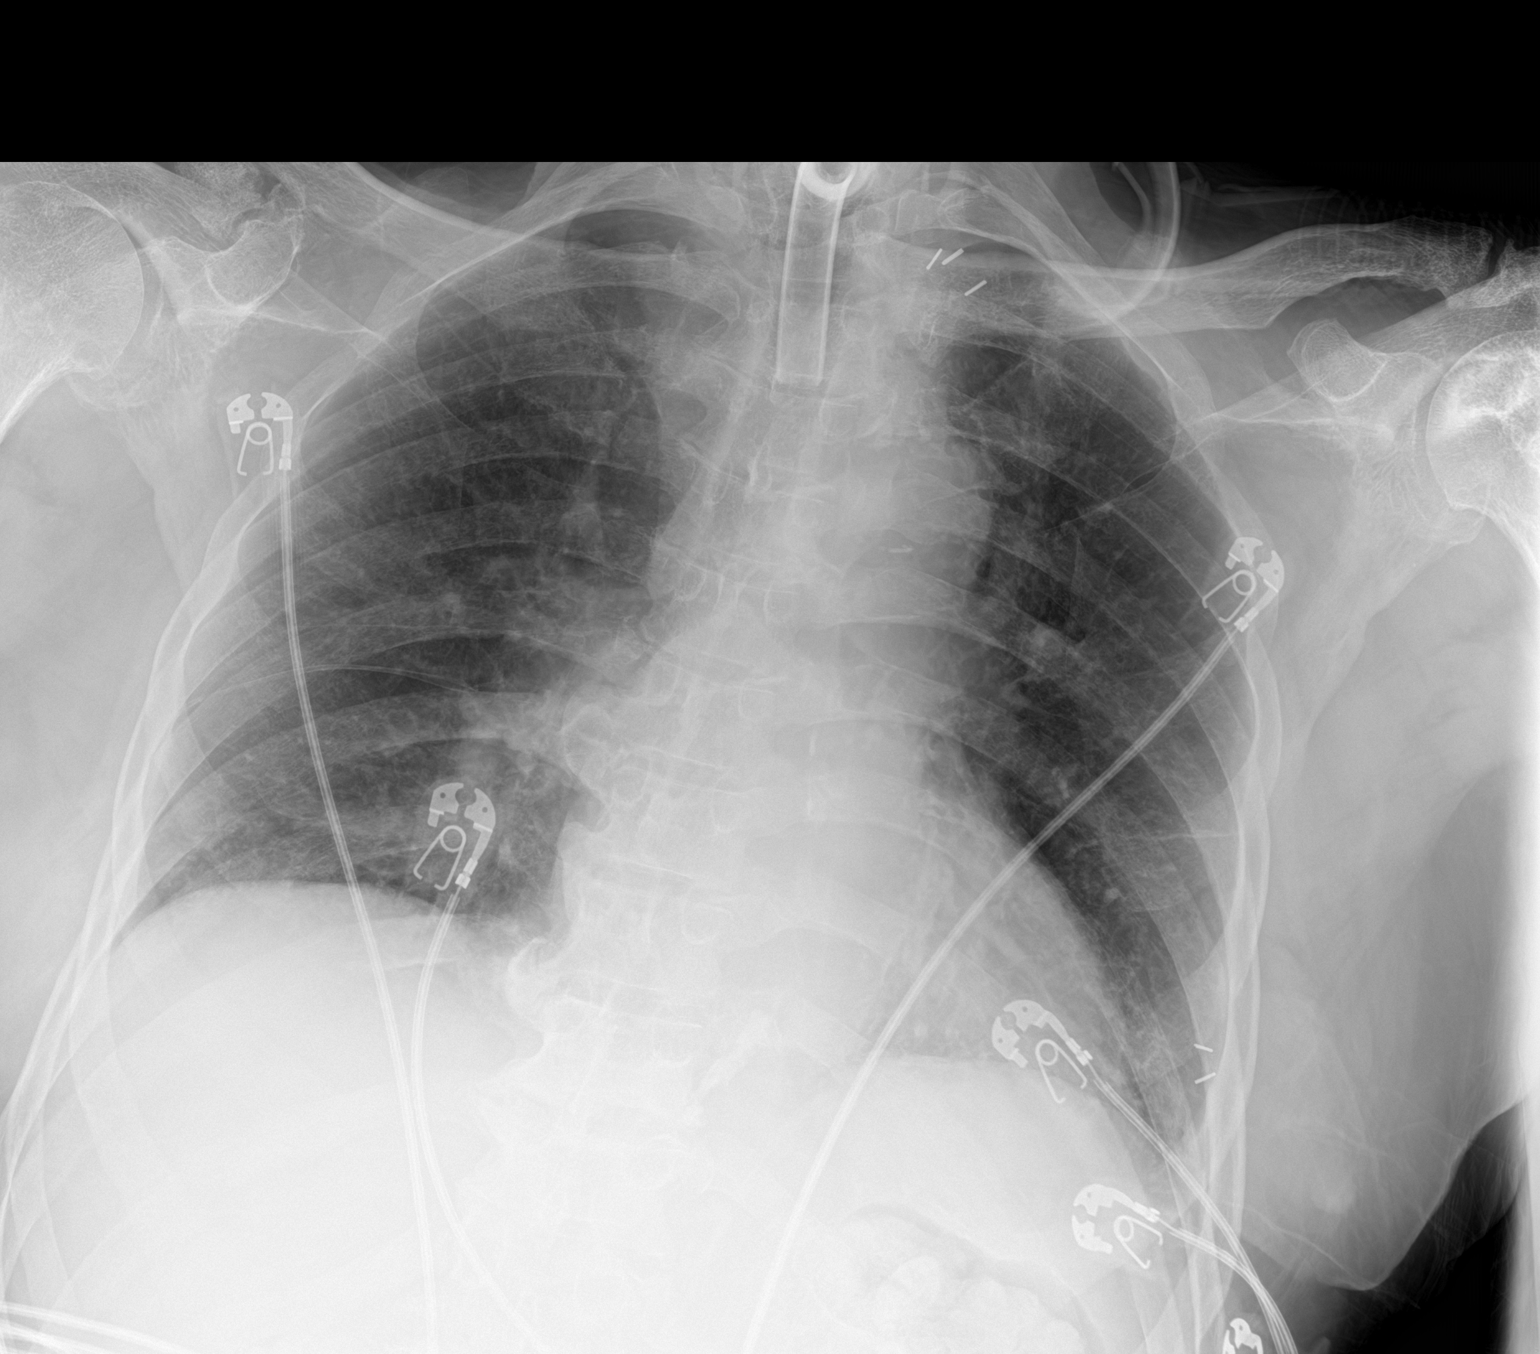

[1 of 1 positions shown; findings below may reference images not displayed]

FINDINGS: Tracheostomy tube remains in place tip in the proximal to mid
trachea. Cardiomediastinal contours are stable. Lungs are clear.

Signs of aortic atherosclerosis. No acute bone process.
IMPRESSION: No interval change in the appearance of the tracheostomy tube. No
acute abnormality.

## 2020-03-15 IMAGING — US US RENAL
1 series · 14 of 23 positions shown · non-contrast
Comparison: 11/19/2018 CT.  10/16/2016 renal ultrasound.

CLINICAL DATA: Acute renal insufficiency.

EXAM:
RENAL / URINARY TRACT ULTRASOUND COMPLETE

[Series 1: us renal · 23 acquisitions, 14 frames shown]
[im 1/23]
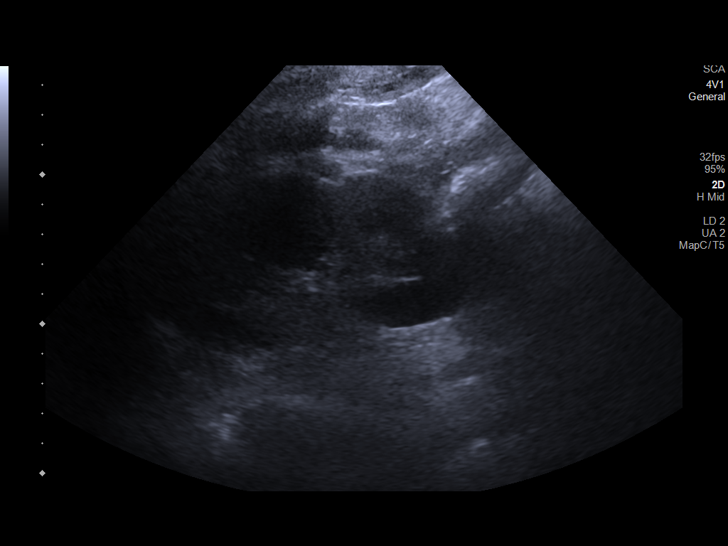
[im 3/23]
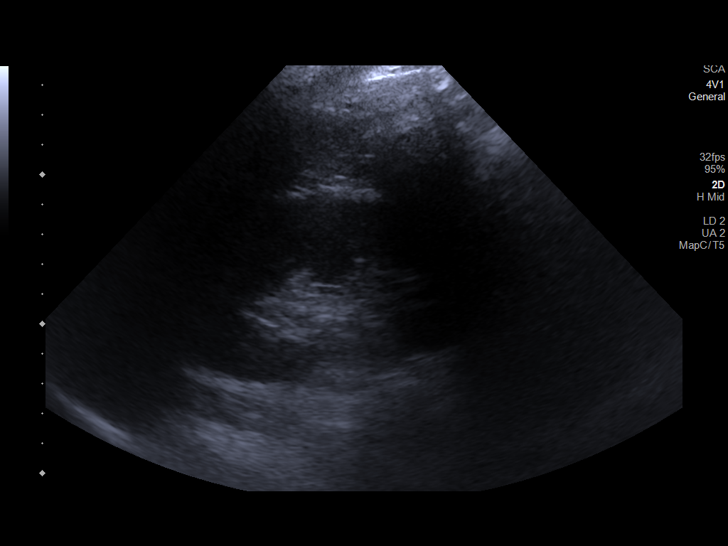
[im 5/23]
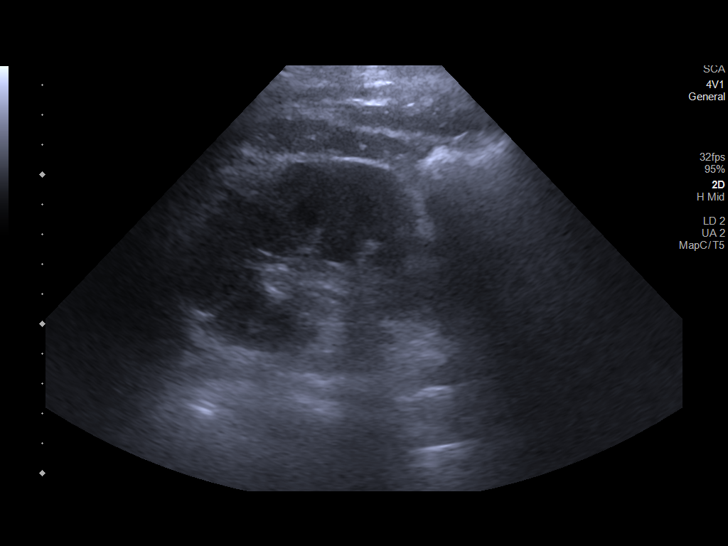
[im 6/23]
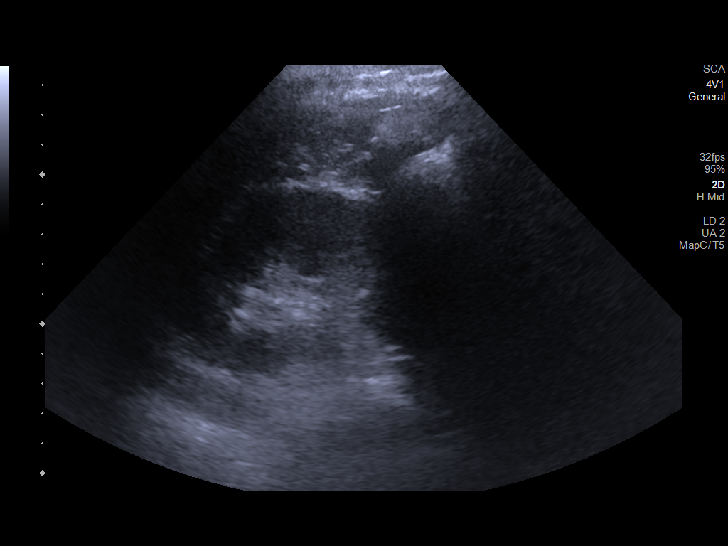
[im 8/23]
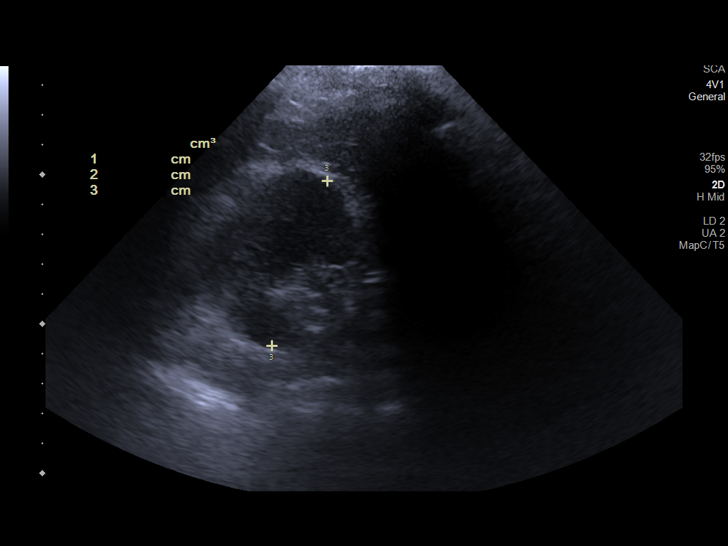
[im 10/23]
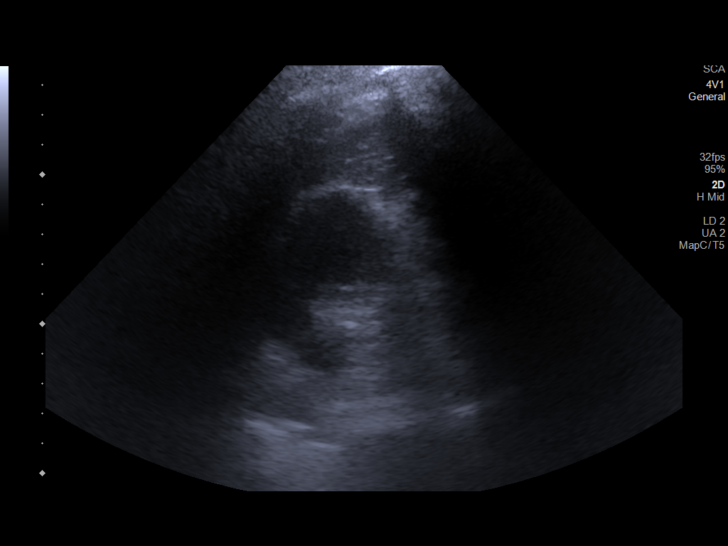
[im 11/23]
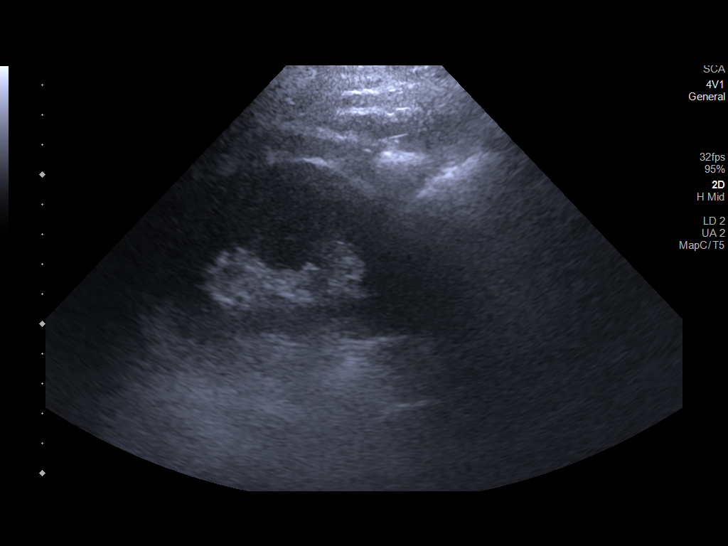
[im 13/23]
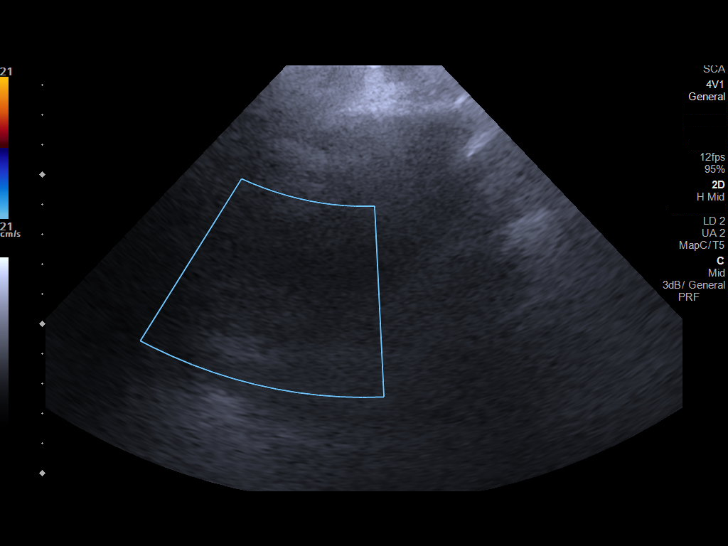
[im 14/23]
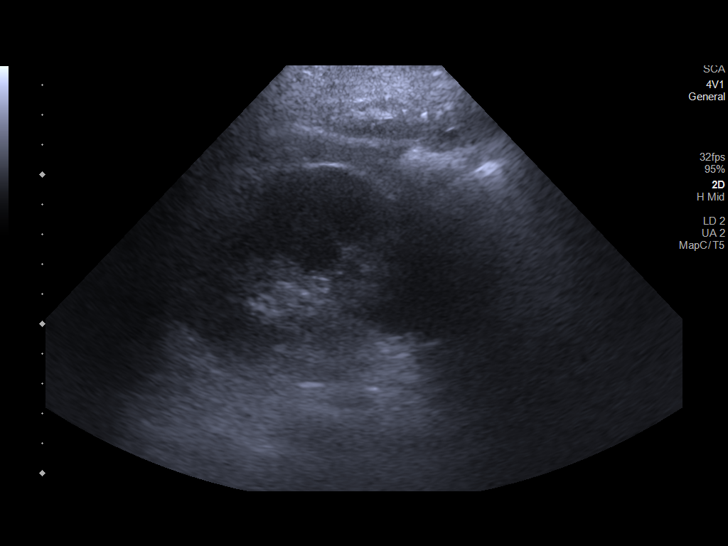
[im 16/23]
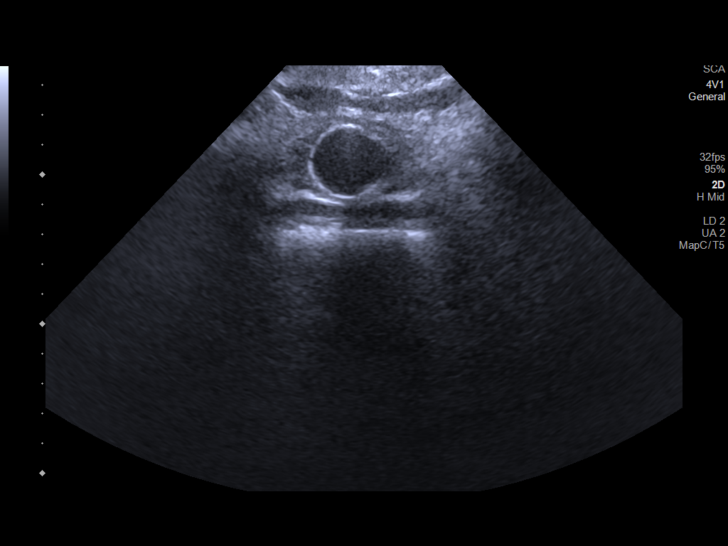
[im 18/23]
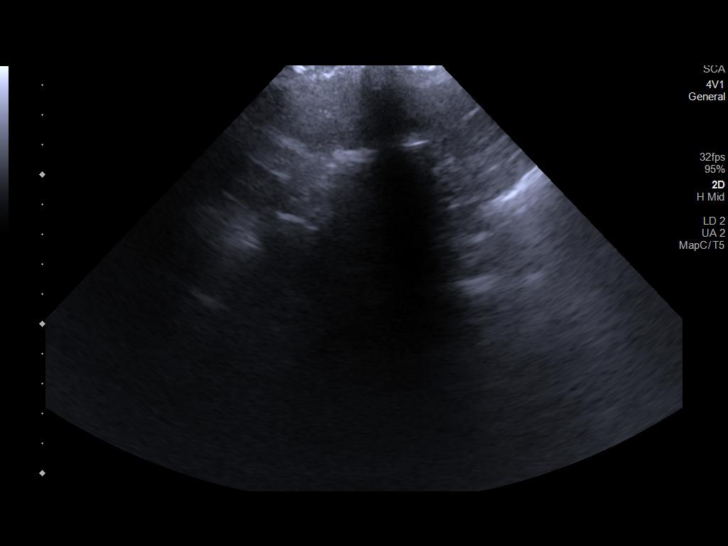
[im 19/23]
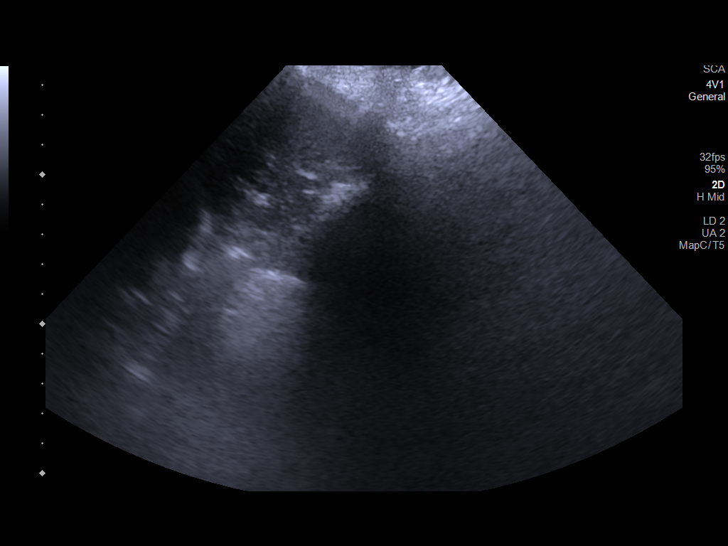
[im 21/23]
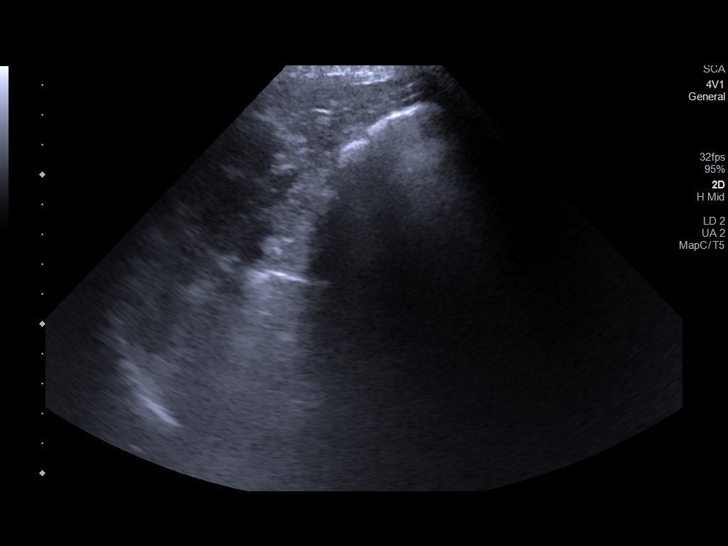
[im 23/23]
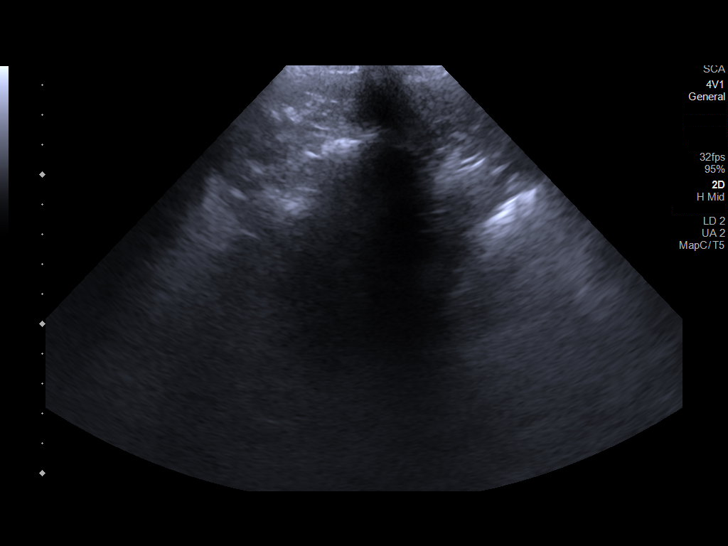

[14 of 23 positions shown; findings below may reference images not displayed]

FINDINGS: Right Kidney:

Renal measurements: 9.4 x 5.9 x 5.8 cm = volume: 168 mL .
Echogenicity within normal limits. No mass or hydronephrosis
visualized.

Left Kidney:

Renal measurements: Severely atrophic on CT.  Not visualized today.

Bladder:

Decompressed around a Foley catheter.

Other:

None.
IMPRESSION: Marked left renal atrophy.  Normal right kidney.

## 2020-03-18 IMAGING — DX DG ABD PORTABLE 1V
1 series · 1 of 1 positions shown · non-contrast
Comparison: None.

CLINICAL DATA: Constipation

EXAM:
PORTABLE ABDOMEN - 1 VIEW

[abdomen kub]
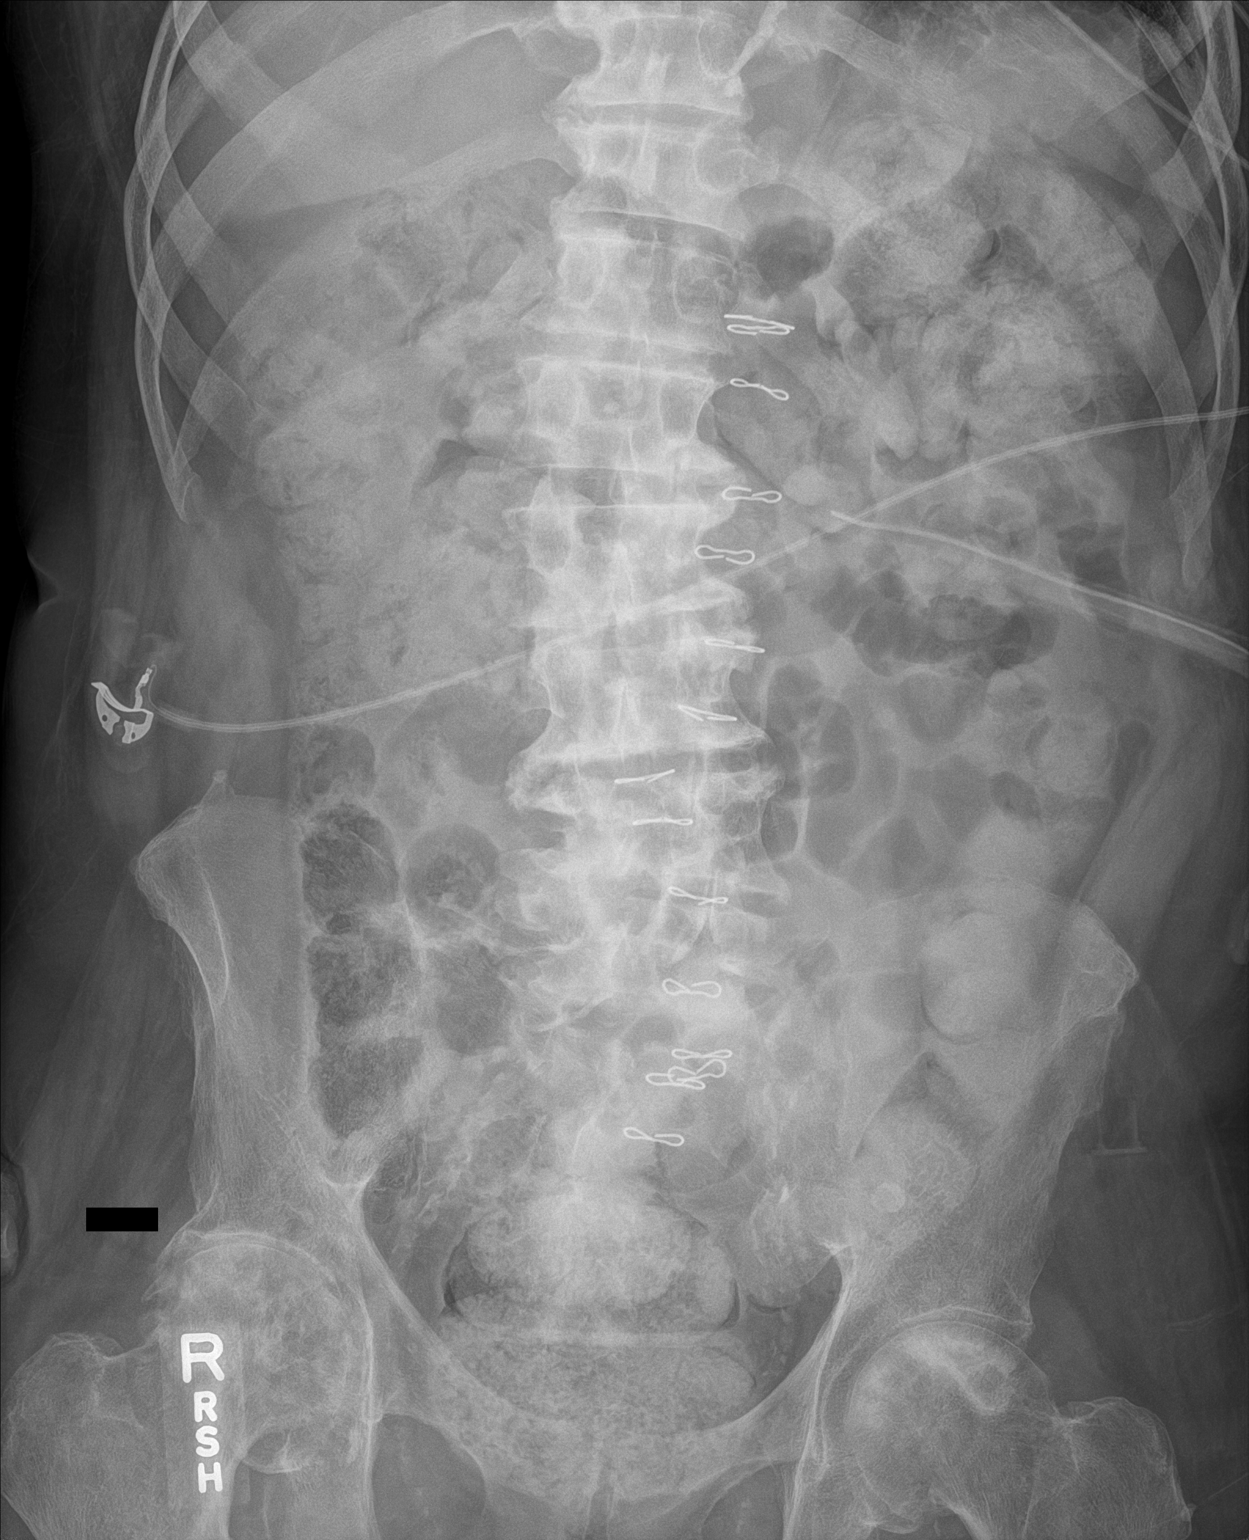

[1 of 1 positions shown; findings below may reference images not displayed]

FINDINGS: There is a large amount of stool throughout the colon. There is no
bowel dilatation to suggest obstruction. There is no evidence of
pneumoperitoneum, portal venous gas or pneumatosis.

There are no pathologic calcifications along the expected course of
the ureters.

There is severe osteoarthritis of the right hip. There is moderate
osteoarthritis of the left hip. There is subchondral sclerosis in
the superior left femoral head concerning for avascular necrosis.
IMPRESSION: Large amount of stool throughout the colon.

## 2020-07-06 ENCOUNTER — Inpatient Hospital Stay
Admission: AD | Admit: 2020-07-06 | Discharge: 2020-09-20 | Disposition: A | Discharge status: Skilled Nursing Facility | Payer: Self-pay | Source: Other Acute Inpatient Hospital | Attending: Internal Medicine | Admitting: Internal Medicine

## 2020-07-06 ENCOUNTER — Other Ambulatory Visit (HOSPITAL_COMMUNITY): Payer: Self-pay

## 2020-07-06 DIAGNOSIS — Z930 Tracheostomy status: Secondary | ICD-10-CM

## 2020-07-06 DIAGNOSIS — T85598A Other mechanical complication of other gastrointestinal prosthetic devices, implants and grafts, initial encounter: Secondary | ICD-10-CM

## 2020-07-06 DIAGNOSIS — Z431 Encounter for attention to gastrostomy: Secondary | ICD-10-CM

## 2020-07-06 DIAGNOSIS — K9423 Gastrostomy malfunction: Secondary | ICD-10-CM

## 2020-07-06 DIAGNOSIS — J9690 Respiratory failure, unspecified, unspecified whether with hypoxia or hypercapnia: Secondary | ICD-10-CM

## 2020-07-06 DIAGNOSIS — Z931 Gastrostomy status: Secondary | ICD-10-CM

## 2020-07-06 DIAGNOSIS — J189 Pneumonia, unspecified organism: Secondary | ICD-10-CM

## 2020-07-06 MED ORDER — IOHEXOL 300 MG/ML  SOLN
50.0000 mL | Freq: Once | INTRAMUSCULAR | Status: AC | PRN
  Administered 2020-07-06: 50 mL

## 2020-07-07 ENCOUNTER — Other Ambulatory Visit (HOSPITAL_COMMUNITY): Payer: Self-pay

## 2020-07-07 LAB — CBC WITH DIFFERENTIAL/PLATELET
Abs Immature Granulocytes: 0.34 10*3/uL — ABNORMAL HIGH (ref 0.00–0.07)
Basophils Absolute: 0.1 10*3/uL (ref 0.0–0.1)
Basophils Relative: 1 %
Eosinophils Absolute: 0.3 10*3/uL (ref 0.0–0.5)
Eosinophils Relative: 1 %
HCT: 32.1 % — ABNORMAL LOW (ref 39.0–52.0)
Hemoglobin: 10.1 g/dL — ABNORMAL LOW (ref 13.0–17.0)
Immature Granulocytes: 2 %
Lymphocytes Relative: 7 %
Lymphs Abs: 1.4 10*3/uL (ref 0.7–4.0)
MCH: 27.4 pg (ref 26.0–34.0)
MCHC: 31.5 g/dL (ref 30.0–36.0)
MCV: 87 fL (ref 80.0–100.0)
Monocytes Absolute: 2.2 10*3/uL — ABNORMAL HIGH (ref 0.1–1.0)
Monocytes Relative: 12 %
Neutro Abs: 14.6 10*3/uL — ABNORMAL HIGH (ref 1.7–7.7)
Neutrophils Relative %: 77 %
Platelets: 614 10*3/uL — ABNORMAL HIGH (ref 150–400)
RBC: 3.69 MIL/uL — ABNORMAL LOW (ref 4.22–5.81)
RDW: 20.2 % — ABNORMAL HIGH (ref 11.5–15.5)
WBC: 18.9 10*3/uL — ABNORMAL HIGH (ref 4.0–10.5)
nRBC: 0 % (ref 0.0–0.2)

## 2020-07-07 LAB — COMPREHENSIVE METABOLIC PANEL
ALT: 16 U/L (ref 0–44)
AST: 19 U/L (ref 15–41)
Albumin: 2.2 g/dL — ABNORMAL LOW (ref 3.5–5.0)
Alkaline Phosphatase: 100 U/L (ref 38–126)
Anion gap: 9 (ref 5–15)
BUN: 31 mg/dL — ABNORMAL HIGH (ref 8–23)
CO2: 32 mmol/L (ref 22–32)
Calcium: 9.7 mg/dL (ref 8.9–10.3)
Chloride: 97 mmol/L — ABNORMAL LOW (ref 98–111)
Creatinine, Ser: 1.05 mg/dL (ref 0.61–1.24)
GFR, Estimated: >60 mL/min (ref >60)
Glucose, Bld: 107 mg/dL — ABNORMAL HIGH (ref 70–99)
Potassium: 5 mmol/L (ref 3.5–5.1)
Sodium: 138 mmol/L (ref 135–145)
Total Bilirubin: 0.7 mg/dL (ref 0.3–1.2)
Total Protein: 7.7 g/dL (ref 6.5–8.1)

## 2020-07-07 LAB — HEMOGLOBIN A1C
Hgb A1c MFr Bld: 6 % — ABNORMAL HIGH (ref 4.8–5.6)
Mean Plasma Glucose: 125.5 mg/dL

## 2020-07-07 LAB — TSH: TSH: 4.014 u[IU]/mL (ref 0.350–4.500)

## 2020-07-07 LAB — MAGNESIUM: Magnesium: 1.8 mg/dL (ref 1.7–2.4)

## 2020-07-07 LAB — T4, FREE: Free T4: 0.82 ng/dL (ref 0.61–1.12)

## 2020-07-07 LAB — PROTIME-INR
INR: 1.1 (ref 0.8–1.2)
Prothrombin Time: 14.2 seconds (ref 11.4–15.2)

## 2020-07-07 LAB — PHOSPHORUS: Phosphorus: 4.8 mg/dL — ABNORMAL HIGH (ref 2.5–4.6)

## 2020-07-08 ENCOUNTER — Encounter (HOSPITAL_BASED_OUTPATIENT_CLINIC_OR_DEPARTMENT_OTHER): Payer: Medicare HMO

## 2020-07-08 DIAGNOSIS — M7989 Other specified soft tissue disorders: Secondary | ICD-10-CM | POA: Diagnosis not present

## 2020-07-08 LAB — URINALYSIS, ROUTINE W REFLEX MICROSCOPIC
Bilirubin Urine: NEGATIVE
Glucose, UA: NEGATIVE mg/dL
Hgb urine dipstick: NEGATIVE
Ketones, ur: NEGATIVE mg/dL
Leukocytes,Ua: NEGATIVE
Nitrite: NEGATIVE
Protein, ur: NEGATIVE mg/dL
Specific Gravity, Urine: 1.012 (ref 1.005–1.030)
pH: 9 — ABNORMAL HIGH (ref 5.0–8.0)

## 2020-07-09 LAB — URINE CULTURE: Culture: NO GROWTH

## 2020-07-10 ENCOUNTER — Other Ambulatory Visit (HOSPITAL_COMMUNITY): Payer: Self-pay

## 2020-07-10 LAB — MAGNESIUM: Magnesium: 2 mg/dL (ref 1.7–2.4)

## 2020-07-10 LAB — BASIC METABOLIC PANEL
Anion gap: 7 (ref 5–15)
BUN: 53 mg/dL — ABNORMAL HIGH (ref 8–23)
CO2: 30 mmol/L (ref 22–32)
Calcium: 10.1 mg/dL (ref 8.9–10.3)
Chloride: 99 mmol/L (ref 98–111)
Creatinine, Ser: 1.07 mg/dL (ref 0.61–1.24)
GFR, Estimated: >60 mL/min (ref >60)
Glucose, Bld: 100 mg/dL — ABNORMAL HIGH (ref 70–99)
Potassium: 6.1 mmol/L — ABNORMAL HIGH (ref 3.5–5.1)
Sodium: 136 mmol/L (ref 135–145)

## 2020-07-10 LAB — CBC
HCT: 34.7 % — ABNORMAL LOW (ref 39.0–52.0)
Hemoglobin: 10.5 g/dL — ABNORMAL LOW (ref 13.0–17.0)
MCH: 26.9 pg (ref 26.0–34.0)
MCHC: 30.3 g/dL (ref 30.0–36.0)
MCV: 88.7 fL (ref 80.0–100.0)
Platelets: 597 10*3/uL — ABNORMAL HIGH (ref 150–400)
RBC: 3.91 MIL/uL — ABNORMAL LOW (ref 4.22–5.81)
RDW: 19.9 % — ABNORMAL HIGH (ref 11.5–15.5)
WBC: 13.9 10*3/uL — ABNORMAL HIGH (ref 4.0–10.5)
nRBC: 0 % (ref 0.0–0.2)

## 2020-07-10 LAB — POTASSIUM: Potassium: 5.3 mmol/L — ABNORMAL HIGH (ref 3.5–5.1)

## 2020-07-10 MED ORDER — IOHEXOL 300 MG/ML  SOLN
35.0000 mL | Freq: Once | INTRAMUSCULAR | Status: AC | PRN
  Administered 2020-07-26: 10 mL

## 2020-07-11 LAB — POTASSIUM: Potassium: 5.6 mmol/L — ABNORMAL HIGH (ref 3.5–5.1)

## 2020-07-12 LAB — BASIC METABOLIC PANEL
Anion gap: 5 (ref 5–15)
BUN: 42 mg/dL — ABNORMAL HIGH (ref 8–23)
CO2: 30 mmol/L (ref 22–32)
Calcium: 9.9 mg/dL (ref 8.9–10.3)
Chloride: 107 mmol/L (ref 98–111)
Creatinine, Ser: 0.88 mg/dL (ref 0.61–1.24)
GFR, Estimated: >60 mL/min (ref >60)
Glucose, Bld: 112 mg/dL — ABNORMAL HIGH (ref 70–99)
Potassium: 4.9 mmol/L (ref 3.5–5.1)
Sodium: 142 mmol/L (ref 135–145)

## 2020-07-13 LAB — CBC
HCT: 32.7 % — ABNORMAL LOW (ref 39.0–52.0)
Hemoglobin: 9.9 g/dL — ABNORMAL LOW (ref 13.0–17.0)
MCH: 27.3 pg (ref 26.0–34.0)
MCHC: 30.3 g/dL (ref 30.0–36.0)
MCV: 90.1 fL (ref 80.0–100.0)
Platelets: 445 10*3/uL — ABNORMAL HIGH (ref 150–400)
RBC: 3.63 MIL/uL — ABNORMAL LOW (ref 4.22–5.81)
RDW: 20 % — ABNORMAL HIGH (ref 11.5–15.5)
WBC: 13.9 10*3/uL — ABNORMAL HIGH (ref 4.0–10.5)
nRBC: 0 % (ref 0.0–0.2)

## 2020-07-13 LAB — BASIC METABOLIC PANEL
Anion gap: 7 (ref 5–15)
BUN: 35 mg/dL — ABNORMAL HIGH (ref 8–23)
CO2: 29 mmol/L (ref 22–32)
Calcium: 9.8 mg/dL (ref 8.9–10.3)
Chloride: 106 mmol/L (ref 98–111)
Creatinine, Ser: 0.79 mg/dL (ref 0.61–1.24)
GFR, Estimated: >60 mL/min (ref >60)
Glucose, Bld: 106 mg/dL — ABNORMAL HIGH (ref 70–99)
Potassium: 4.6 mmol/L (ref 3.5–5.1)
Sodium: 142 mmol/L (ref 135–145)

## 2020-07-13 LAB — MAGNESIUM: Magnesium: 1.6 mg/dL — ABNORMAL LOW (ref 1.7–2.4)

## 2020-07-13 LAB — PHOSPHORUS: Phosphorus: 2.8 mg/dL (ref 2.5–4.6)

## 2020-07-14 ENCOUNTER — Other Ambulatory Visit (HOSPITAL_COMMUNITY): Payer: Self-pay

## 2020-07-14 LAB — CBC
HCT: 35 % — ABNORMAL LOW (ref 39.0–52.0)
Hemoglobin: 10.5 g/dL — ABNORMAL LOW (ref 13.0–17.0)
MCH: 27 pg (ref 26.0–34.0)
MCHC: 30 g/dL (ref 30.0–36.0)
MCV: 90 fL (ref 80.0–100.0)
Platelets: 458 10*3/uL — ABNORMAL HIGH (ref 150–400)
RBC: 3.89 MIL/uL — ABNORMAL LOW (ref 4.22–5.81)
RDW: 19.6 % — ABNORMAL HIGH (ref 11.5–15.5)
WBC: 15.1 10*3/uL — ABNORMAL HIGH (ref 4.0–10.5)
nRBC: 0 % (ref 0.0–0.2)

## 2020-07-14 LAB — BASIC METABOLIC PANEL
Anion gap: 5 (ref 5–15)
BUN: 29 mg/dL — ABNORMAL HIGH (ref 8–23)
CO2: 31 mmol/L (ref 22–32)
Calcium: 10.1 mg/dL (ref 8.9–10.3)
Chloride: 103 mmol/L (ref 98–111)
Creatinine, Ser: 0.79 mg/dL (ref 0.61–1.24)
GFR, Estimated: >60 mL/min (ref >60)
Glucose, Bld: 94 mg/dL (ref 70–99)
Potassium: 4.9 mmol/L (ref 3.5–5.1)
Sodium: 139 mmol/L (ref 135–145)

## 2020-07-14 LAB — MAGNESIUM: Magnesium: 1.9 mg/dL (ref 1.7–2.4)

## 2020-07-15 LAB — MISC LABCORP TEST (SEND OUT): Labcorp test code: 96388

## 2020-07-15 LAB — MIC RESULT

## 2020-07-15 LAB — MINIMUM INHIBITORY CONC. (1 DRUG)

## 2020-07-15 NOTE — Consult Note (Addendum)
Infectious Disease Consultation   SUJAY GRUNDMAN  ZCH:[id number]  DOB: 01-11-1951  DOA: 07/06/2020  Requesting physician: Dr.Hijazi  Reason for consultation: Antibiotic recommendations   History of Present Illness: Scott Rivera is an 70 y.o. 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 difficile colitis, ESBL Klebsiella pneumonia, MRSA UTI, recurrent aspiration who apparently presented to the outside hospital with altered mental status.  Chest x-ray showed diffuse bilateral infiltrates.  He was found to have a large amount of feeding tube material in his trachea.  CT of the brain showed large posterior circulation infarct involving the cerebellum.  Neurology was consulted.  He was transferred to the ICU, continued on ventilator support with tracheostomy.  He was treated with antimicrobials for healthcare associated pneumonia as well as for C. difficile infection.  He apparently was sent to the hospital from a skilled nursing facility.  He had EEG due to seizure episode which showed general slowing.  CT of the head with extensive chronic ischemic changes, slight progression in the left cerebellum.  He was treated with IV vancomycin, Invanz, Flagyl as well as p.o. vancomycin during his hospital stay.  He was continued with pulmonary toileting and continued on tube feeds.  He was found to have right upper extremity DVT and brachial, basilic veins as per ultrasound.  He was started on weaning trials.  Palliative care was also consulted.  He had anemia with hemoglobin dropping down to 6.9 and received 1 unit of PRBC.  Due to his complex problems he was transferred and admitted to [facility].  He had respiratory cultures collected on 07/07/2020 that is showing Pseudomonas aeruginosa  multidrug-resistant.  He is continuing to have increased secretions.   Review of Systems:  He is nonverbal but nods when asked questions.  Review of systems negative except as mentioned above in the HPI.  Past Medical History: Past Medical History:  Diagnosis Date  . Acute pulmonary edema (HCC)   . Anemia due to chronic kidney disease   . ARF (acute renal failure) ([place]) 10/19/2016  . Aspiration pneumonia ([place])   . Cerebellar infarct ([place]) 10/19/2016  . Chronic kidney disease, stage 3 (moderate)   . CKD (chronic kidney disease) stage 2, GFR 60-89 ml/min 01/02/2017  . 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 Gastrostomy tube obstruction ([place])   . HCAP (healthcare-associated pneumonia)   . Helicobacter pylori infection 09/21/2012  . History of adenomatous polyp of colon 07/28/2015  . Hypertension   . MDR Acinetobacter baumannii infection   . Pneumothorax, closed, traumatic, initial encounter 12/10/2016  . Renal insufficiency   . Rhabdomyolysis 10/19/2016  . S/P percutaneous endoscopic gastrostomy (PEG) tube placement ([place]) 03/30/2017  . Stage III pressure ulcer of sacral region ([place]) 12/17/2016  . Stroke ([place])   . UTI (urinary tract infection) 03/30/2017    Past Surgical History: Past Surgical History:  Procedure Laterality Date  . BIOPSY  01/01/2019   Procedure: BIOPSY;  Surgeon: Irving Copas., MD;  Location: [facility];  Service: Gastroenterology;;  . ESOPHAGOGASTRODUODENOSCOPY (EGD) WITH PROPOFOL N/A 01/01/2019   Procedure: ESOPHAGOGASTRODUODENOSCOPY (EGD) WITH PROPOFOL;  Surgeon: Irving Copas., MD;  Location: [facility];  Service: Gastroenterology;  Laterality: N/A;  . IR GASTROSTOMY TUBE MOD SED  10/27/2016  . IR PATIENT  EVAL TECH 0-60 MINS  01/05/2017  . IR PATIENT EVAL TECH 0-60 MINS  01/09/2017  . IR PATIENT EVAL TECH 0-60 MINS  03/03/2017  . IR PATIENT EVAL TECH 0-60 MINS  04/03/2017  . IR REPLACE G-TUBE SIMPLE WO FLUORO  11/20/2017  .  IR [place] TUBE PERCUT W/FLUORO  07/23/2017  . KNEE SURGERY    . TRACHEOSTOMY TUBE PLACEMENT      Allergies:  No Known Allergies   Social History:  reports that he has quit smoking. He has never used smokeless tobacco. He reports previous alcohol use. He reports previous drug use.   Family History: Family History  Problem Relation Age of Onset  . Diabetes Mellitus II Brother   . CAD Neg Hx   . Stroke Neg Hx    Physical Exam: Vitals: Temperature 97.1, pulse 92, respiratory rate 22, blood pressure 148/76, pulse oximetry 93% Constitutional: Chronically ill-appearing, thin male Head: Atraumatic, normocephalic Eyes: PERLA, EOMI, irises appear normal ENMT: external ears and nose appear normal, normal hearing, Lips appears normal, moist oral mucosa Neck: Trach in place CVS: S1-S2 Respiratory: Coarse breath sounds with rhonchi Abdomen: soft nontender, nondistended, normal bowel sounds Musculoskeletal: Mild edema of the upper extremity Neuro: Debility with generalized weakness Psych: Encephalopathic Skin: no rashes   Data reviewed:  I have personally reviewed following labs and imaging studies Labs:  CBC: Recent Labs  Lab 07/10/20 0243 07/13/20 0604 07/14/20 0948  WBC 13.9* 13.9* 15.1*  HGB 10.5* 9.9* 10.5*  HCT 34.7* 32.7* 35.0*  MCV 88.7 90.1 90.0  PLT 597* 445* 458*    Basic Metabolic Panel: Recent Labs  Lab 07/10/20 0243 07/10/20 1746 07/12/20 0712 07/13/20 0604 07/14/20 0948  NA 136  --  142 142 139  K 6.1*   < > 4.9 4.6 4.9  CL 99  --  107 106 103  CO2 30  --  30 29 31   GLUCOSE 100*  --  112* 106* 94  BUN 53*  --  42* 35* 29*  CREATININE 1.07  --  0.88 0.79 0.79  CALCIUM 10.1  --  9.9 9.8 10.1  MG 2.0  --   --  1.6* 1.9  PHOS  --   --   --  2.8  --    < > = values in this interval not displayed.   GFR CrCl cannot be calculated (Unknown ideal weight.). Liver Function Tests: No results for input(s): AST, ALT, ALKPHOS, BILITOT, PROT,  ALBUMIN in the last 168 hours. No results for input(s): LIPASE, AMYLASE in the last 168 hours. No results for input(s): AMMONIA in the last 168 hours. Coagulation profile No results for input(s): INR, PROTIME in the last 168 hours.  Cardiac Enzymes: No results for input(s): CKTOTAL, CKMB, CKMBINDEX, TROPONINI in the last 168 hours. BNP: Invalid input(s): POCBNP CBG: No results for input(s): GLUCAP in the last 168 hours. D-Dimer No results for input(s): DDIMER in the last 72 hours. Hgb A1c No results for input(s): HGBA1C in the last 72 hours. Lipid Profile No results for input(s): CHOL, HDL, LDLCALC, TRIG, CHOLHDL, LDLDIRECT in the last 72 hours. Thyroid function studies No results for input(s): TSH, T4TOTAL, T3FREE, THYROIDAB in the last 72 hours.  Invalid input(s): FREET3 Anemia work up No results for input(s): VITAMINB12, FOLATE, FERRITIN, TIBC, IRON, RETICCTPCT in the last 72 hours. Urinalysis    Component Value Date/Time   COLORURINE YELLOW 07/08/2020 0945   APPEARANCEUR CLEAR 07/08/2020 0945   LABSPEC 1.012 07/08/2020 0945  PHURINE 9.0 (H) 07/08/2020 0945   GLUCOSEU NEGATIVE 07/08/2020 0945   HGBUR NEGATIVE 07/08/2020 0945   BILIRUBINUR NEGATIVE 07/08/2020 0945   KETONESUR NEGATIVE 07/08/2020 0945   PROTEINUR NEGATIVE 07/08/2020 0945   NITRITE NEGATIVE 07/08/2020 0945   LEUKOCYTESUR NEGATIVE 07/08/2020 0945     Sepsis Labs Invalid input(s): PROCALCITONIN,  WBC,  LACTICIDVEN Microbiology Recent Results (from the past 240 hour(s))  Culture, Respiratory w Gram Stain     Status: None (Preliminary result)   Collection Time: 07/07/20  1:55 PM   Specimen: Tracheal Aspirate; Respiratory  Result Value Ref Range Status   Specimen Description TRACHEAL ASPIRATE  Final   Special Requests NONE  Final   Gram Stain   Final    ABUNDANT WBC PRESENT,BOTH PMN AND MONONUCLEAR MODERATE SQUAMOUS EPITHELIAL CELLS PRESENT RARE GRAM NEGATIVE RODS    Culture   Final    FEW  PSEUDOMONAS AERUGINOSA MULTI-DRUG RESISTANT ORGANISM RESULT CALLED TO, READ BACK BY AND VERIFIED WITH: RN M.PONSONES ON 07/12/2020 AT 35 BY E.PARRISH Sent to [place] for further susceptibility testing. Performed at [facility], [place] [address] [place], [place] [address]    Report Status PENDING  Incomplete   Organism ID, Bacteria PSEUDOMONAS AERUGINOSA  Final      Susceptibility   Pseudomonas aeruginosa - MIC*    CEFTAZIDIME 32 RESISTANT Resistant     CIPROFLOXACIN 2 INTERMEDIATE Intermediate     GENTAMICIN 8 INTERMEDIATE Intermediate     IMIPENEM >=16 RESISTANT Resistant     CEFEPIME >=32 RESISTANT Resistant     * FEW PSEUDOMONAS AERUGINOSA  Urine Culture     Status: None   Collection Time: 07/07/20  2:05 PM   Specimen: Urine, Random  Result Value Ref Range Status   Specimen Description URINE, RANDOM  Final   Special Requests NONE  Final   Culture   Final    NO GROWTH Performed at [facility], [place] [address] [place], [place] [address]    Report Status 07/09/2020 FINAL  Final    Inpatient Medications:   Please see MAR  Radiological Exams on Admission: DG Chest Port 1 View  Result Date: 07/14/2020 CLINICAL DATA:  Pneumonia EXAM: PORTABLE CHEST 1 VIEW COMPARISON:  07/07/2020, 10/16/2019 FINDINGS: Stable right basilar focal pulmonary infiltrate in keeping with acute lobar pneumonia. Minimal atelectasis or infiltrate within the left lung base. No pneumothorax or pleural effusion. Tracheostomy in unchanged position. Cardiac size within normal limits. Pulmonary vascularity is normal. IMPRESSION: Stable right basilar focal pulmonary pneumonic infiltrate Electronically Signed   By: Fidela Salisbury MD   On: 07/14/2020 06:31    Impression/Recommendations Active Problems: Acute on chronic respiratory failure with hypoxemia Pneumonia with Pseudomonas, multidrug resistant Leukocytosis C. 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  Patient had previous history of stroke now with new stroke.  He also has dysphagia and high risk for recurrent aspiration and aspiration pneumonia.  Recent respiratory cultures showing Pseudomonas aeruginosa, multidrug-resistant.  At the acute facility he was treated with multiple antibiotics.  Here he was started on IV cefepime and Flagyl.  At this time the Pseudomonas is resistant.  Therefore recommended to discontinue the cefepime and switch him to [employer].  Also added tobramycin nebulizers due to the multidrug-resistant Pseudomonas.  Discussed with pharmacy and primary team and suggested to  call the lab and set up susceptibilities for Zerbaxa, cefiderocol and Avycaz.  Will plan to treat for duration of 7-10 days pending improvement.  He is continuing to have increased secretions with coarse breath sounds likely secondary to ongoing aspiration in the event of her recent stroke.  If his respiratory status is worsening consider repeating chest imaging preferably chest CT which can be done without contrast if concern for renal compromise.  Pneumonia: As mentioned above, respiratory cultures showing Pseudomonas multidrug-resistant.  Antibiotics and plan as mentioned above.  Discussed with the primary team and pharmacy and plan to call the lab to set up susceptibilities for cefiderocol, Avycaz and Zerbaxa.  Due to his recent stroke, suspected ongoing aspiration he is at risk for recurrent aspiration and aspiration pneumonia and worsening respiratory failure despite being on antibiotics.  Leukocytosis: Likely secondary to the pneumonia.  Antibiotics as mentioned above.  Continue to monitor closely.  Calcium difficile colitis: Patient had C. difficile colitis at the acute  facility.  Currently on p.o. vancomycin.  Recommend to continue while on antibiotics to prevent recurrence of seizure.  History of previous CVA with new stroke/stenosis of the left internal carotid artery/vertebral artery: Unfortunately because of this he is high risk for recurrent strokes.  He is on aspirin.  Plavix on hold due to low hemoglobin.  Further investigation and management per the primary team.  Encephalopathy: Likely toxic/metabolic in the setting of recent stroke and infection.  Antibiotics as mentioned above.  Continue to supportive management and medications per the primary team.  Seizure activity: Patient was noted to have seizures and is status post EEG at the acute facility.  Neurology was consulted at the acute facility.  EEG apparently showed moderate diffuse slowing.  Unfortunately now needing antibiotics for the multidrug-resistant Pseudomonas.  Antibiotics rarely lower the seizure threshold.  However, due to his multidrug-resistant Pseudomonas and high risk for worsening with sepsis we have no choice but to treat him with antibiotics.  Continue to monitor closely.  Recent COVID-19 infection: He is on hydroxyurea and folic acid.  He probably has high suspicion for sequelae.  Continue supportive management per primary team.  Dysphagia/protein calorie malnutrition: Due to his dysphagia he is high risk for recurrent aspiration and aspiration pneumonia despite being on antibiotics.  Further management of PCM per the primary team.  Unfortunately due to his complex medical problems he is high risk for worsening and decompensation.  Plan of care discussed with the patient, primary team and pharmacy. Thank you for this consultation.   Yaakov Guthrie M.D. 07/15/2020, 10:55 PM

## 2020-07-16 LAB — CBC
HCT: 32.4 % — ABNORMAL LOW (ref 39.0–52.0)
Hemoglobin: 10.1 g/dL — ABNORMAL LOW (ref 13.0–17.0)
MCH: 27.2 pg (ref 26.0–34.0)
MCHC: 31.2 g/dL (ref 30.0–36.0)
MCV: 87.1 fL (ref 80.0–100.0)
Platelets: 362 10*3/uL (ref 150–400)
RBC: 3.72 MIL/uL — ABNORMAL LOW (ref 4.22–5.81)
RDW: 19.5 % — ABNORMAL HIGH (ref 11.5–15.5)
WBC: 11.5 10*3/uL — ABNORMAL HIGH (ref 4.0–10.5)
nRBC: 0.2 % (ref 0.0–0.2)

## 2020-07-16 LAB — RENAL FUNCTION PANEL
Albumin: 2.2 g/dL — ABNORMAL LOW (ref 3.5–5.0)
Anion gap: 7 (ref 5–15)
BUN: 48 mg/dL — ABNORMAL HIGH (ref 8–23)
CO2: 29 mmol/L (ref 22–32)
Calcium: 9.8 mg/dL (ref 8.9–10.3)
Chloride: 98 mmol/L (ref 98–111)
Creatinine, Ser: 1.06 mg/dL (ref 0.61–1.24)
GFR, Estimated: >60 mL/min (ref >60)
Glucose, Bld: 104 mg/dL — ABNORMAL HIGH (ref 70–99)
Phosphorus: 3.4 mg/dL (ref 2.5–4.6)
Potassium: 5 mmol/L (ref 3.5–5.1)
Sodium: 134 mmol/L — ABNORMAL LOW (ref 135–145)

## 2020-07-16 LAB — MAGNESIUM: Magnesium: 1.6 mg/dL — ABNORMAL LOW (ref 1.7–2.4)

## 2020-07-17 ENCOUNTER — Other Ambulatory Visit (HOSPITAL_COMMUNITY): Payer: Self-pay

## 2020-07-17 LAB — RENAL FUNCTION PANEL
Albumin: 2.3 g/dL — ABNORMAL LOW (ref 3.5–5.0)
Anion gap: 6 (ref 5–15)
BUN: 48 mg/dL — ABNORMAL HIGH (ref 8–23)
CO2: 29 mmol/L (ref 22–32)
Calcium: 9.4 mg/dL (ref 8.9–10.3)
Chloride: 98 mmol/L (ref 98–111)
Creatinine, Ser: 0.94 mg/dL (ref 0.61–1.24)
GFR, Estimated: >60 mL/min (ref >60)
Glucose, Bld: 113 mg/dL — ABNORMAL HIGH (ref 70–99)
Phosphorus: 3.9 mg/dL (ref 2.5–4.6)
Potassium: 5 mmol/L (ref 3.5–5.1)
Sodium: 133 mmol/L — ABNORMAL LOW (ref 135–145)

## 2020-07-17 LAB — MAGNESIUM: Magnesium: 1.9 mg/dL (ref 1.7–2.4)

## 2020-07-18 LAB — RENAL FUNCTION PANEL
Albumin: 2.5 g/dL — ABNORMAL LOW (ref 3.5–5.0)
Anion gap: 7 (ref 5–15)
BUN: 41 mg/dL — ABNORMAL HIGH (ref 8–23)
CO2: 30 mmol/L (ref 22–32)
Calcium: 9.8 mg/dL (ref 8.9–10.3)
Chloride: 99 mmol/L (ref 98–111)
Creatinine, Ser: 0.9 mg/dL (ref 0.61–1.24)
GFR, Estimated: >60 mL/min (ref >60)
Glucose, Bld: 104 mg/dL — ABNORMAL HIGH (ref 70–99)
Phosphorus: 3.6 mg/dL (ref 2.5–4.6)
Potassium: 4.5 mmol/L (ref 3.5–5.1)
Sodium: 136 mmol/L (ref 135–145)

## 2020-07-18 LAB — CBC
HCT: 36.7 % — ABNORMAL LOW (ref 39.0–52.0)
Hemoglobin: 11.3 g/dL — ABNORMAL LOW (ref 13.0–17.0)
MCH: 27.6 pg (ref 26.0–34.0)
MCHC: 30.8 g/dL (ref 30.0–36.0)
MCV: 89.7 fL (ref 80.0–100.0)
Platelets: 340 10*3/uL (ref 150–400)
RBC: 4.09 MIL/uL — ABNORMAL LOW (ref 4.22–5.81)
RDW: 19.9 % — ABNORMAL HIGH (ref 11.5–15.5)
WBC: 12 10*3/uL — ABNORMAL HIGH (ref 4.0–10.5)
nRBC: 0 % (ref 0.0–0.2)

## 2020-07-18 LAB — MAGNESIUM: Magnesium: 1.8 mg/dL (ref 1.7–2.4)

## 2020-07-22 LAB — CBC
HCT: 35.9 % — ABNORMAL LOW (ref 39.0–52.0)
Hemoglobin: 11 g/dL — ABNORMAL LOW (ref 13.0–17.0)
MCH: 27.2 pg (ref 26.0–34.0)
MCHC: 30.6 g/dL (ref 30.0–36.0)
MCV: 88.9 fL (ref 80.0–100.0)
Platelets: 259 10*3/uL (ref 150–400)
RBC: 4.04 MIL/uL — ABNORMAL LOW (ref 4.22–5.81)
RDW: 21.1 % — ABNORMAL HIGH (ref 11.5–15.5)
WBC: 14.2 10*3/uL — ABNORMAL HIGH (ref 4.0–10.5)
nRBC: 0 % (ref 0.0–0.2)

## 2020-07-22 LAB — BASIC METABOLIC PANEL
Anion gap: 6 (ref 5–15)
BUN: 25 mg/dL — ABNORMAL HIGH (ref 8–23)
CO2: 30 mmol/L (ref 22–32)
Calcium: 10 mg/dL (ref 8.9–10.3)
Chloride: 99 mmol/L (ref 98–111)
Creatinine, Ser: 0.79 mg/dL (ref 0.61–1.24)
GFR, Estimated: >60 mL/min (ref >60)
Glucose, Bld: 93 mg/dL (ref 70–99)
Potassium: 4.6 mmol/L (ref 3.5–5.1)
Sodium: 135 mmol/L (ref 135–145)

## 2020-07-22 LAB — MAGNESIUM: Magnesium: 1.7 mg/dL (ref 1.7–2.4)

## 2020-07-23 ENCOUNTER — Other Ambulatory Visit (HOSPITAL_COMMUNITY): Payer: Self-pay

## 2020-07-23 HISTORY — PX: IR REPLC GASTRO/COLONIC TUBE PERCUT W/FLUORO: IMG2333

## 2020-07-23 LAB — MAGNESIUM: Magnesium: 1.9 mg/dL (ref 1.7–2.4)

## 2020-07-23 MED ORDER — LIDOCAINE VISCOUS HCL 2 % MT SOLN
OROMUCOSAL | Status: AC
  Filled 2020-07-23: qty 15

## 2020-07-26 ENCOUNTER — Other Ambulatory Visit (HOSPITAL_COMMUNITY): Payer: Self-pay

## 2020-07-26 LAB — MAGNESIUM: Magnesium: 1.7 mg/dL (ref 1.7–2.4)

## 2020-07-26 LAB — BASIC METABOLIC PANEL
Anion gap: 9 (ref 5–15)
BUN: 29 mg/dL — ABNORMAL HIGH (ref 8–23)
CO2: 29 mmol/L (ref 22–32)
Calcium: 10.2 mg/dL (ref 8.9–10.3)
Chloride: 99 mmol/L (ref 98–111)
Creatinine, Ser: 0.75 mg/dL (ref 0.61–1.24)
GFR, Estimated: >60 mL/min (ref >60)
Glucose, Bld: 98 mg/dL (ref 70–99)
Potassium: 5.4 mmol/L — ABNORMAL HIGH (ref 3.5–5.1)
Sodium: 137 mmol/L (ref 135–145)

## 2020-07-26 LAB — CBC
HCT: 35 % — ABNORMAL LOW (ref 39.0–52.0)
Hemoglobin: 10.8 g/dL — ABNORMAL LOW (ref 13.0–17.0)
MCH: 28.1 pg (ref 26.0–34.0)
MCHC: 30.9 g/dL (ref 30.0–36.0)
MCV: 91.1 fL (ref 80.0–100.0)
Platelets: 289 10*3/uL (ref 150–400)
RBC: 3.84 MIL/uL — ABNORMAL LOW (ref 4.22–5.81)
RDW: 21.7 % — ABNORMAL HIGH (ref 11.5–15.5)
WBC: 16.5 10*3/uL — ABNORMAL HIGH (ref 4.0–10.5)
nRBC: 0 % (ref 0.0–0.2)

## 2020-07-27 LAB — CBC
HCT: 38.4 % — ABNORMAL LOW (ref 39.0–52.0)
Hemoglobin: 11.7 g/dL — ABNORMAL LOW (ref 13.0–17.0)
MCH: 28 pg (ref 26.0–34.0)
MCHC: 30.5 g/dL (ref 30.0–36.0)
MCV: 91.9 fL (ref 80.0–100.0)
Platelets: 294 10*3/uL (ref 150–400)
RBC: 4.18 MIL/uL — ABNORMAL LOW (ref 4.22–5.81)
RDW: 21.9 % — ABNORMAL HIGH (ref 11.5–15.5)
WBC: 12 10*3/uL — ABNORMAL HIGH (ref 4.0–10.5)
nRBC: 0 % (ref 0.0–0.2)

## 2020-07-27 LAB — RENAL FUNCTION PANEL
Albumin: 2.9 g/dL — ABNORMAL LOW (ref 3.5–5.0)
Anion gap: 5 (ref 5–15)
BUN: 39 mg/dL — ABNORMAL HIGH (ref 8–23)
CO2: 32 mmol/L (ref 22–32)
Calcium: 10.6 mg/dL — ABNORMAL HIGH (ref 8.9–10.3)
Chloride: 100 mmol/L (ref 98–111)
Creatinine, Ser: 0.79 mg/dL (ref 0.61–1.24)
GFR, Estimated: >60 mL/min (ref >60)
Glucose, Bld: 79 mg/dL (ref 70–99)
Phosphorus: 3.8 mg/dL (ref 2.5–4.6)
Potassium: 5.8 mmol/L — ABNORMAL HIGH (ref 3.5–5.1)
Sodium: 137 mmol/L (ref 135–145)

## 2020-07-27 LAB — MAGNESIUM: Magnesium: 2.2 mg/dL (ref 1.7–2.4)

## 2020-07-28 LAB — POTASSIUM
Potassium: 6 mmol/L — ABNORMAL HIGH (ref 3.5–5.1)
Potassium: 4.9 mmol/L (ref 3.5–5.1)

## 2020-07-29 LAB — CBC
HCT: 38.9 % — ABNORMAL LOW (ref 39.0–52.0)
Hemoglobin: 11.7 g/dL — ABNORMAL LOW (ref 13.0–17.0)
MCH: 27.8 pg (ref 26.0–34.0)
MCHC: 30.1 g/dL (ref 30.0–36.0)
MCV: 92.4 fL (ref 80.0–100.0)
Platelets: 297 10*3/uL (ref 150–400)
RBC: 4.21 MIL/uL — ABNORMAL LOW (ref 4.22–5.81)
RDW: 22.5 % — ABNORMAL HIGH (ref 11.5–15.5)
WBC: 13 10*3/uL — ABNORMAL HIGH (ref 4.0–10.5)
nRBC: 0 % (ref 0.0–0.2)

## 2020-07-29 LAB — CULTURE, RESPIRATORY W GRAM STAIN

## 2020-07-29 LAB — RENAL FUNCTION PANEL
Albumin: 2.8 g/dL — ABNORMAL LOW (ref 3.5–5.0)
Anion gap: 11 (ref 5–15)
BUN: 56 mg/dL — ABNORMAL HIGH (ref 8–23)
CO2: 31 mmol/L (ref 22–32)
Calcium: 10.3 mg/dL (ref 8.9–10.3)
Chloride: 103 mmol/L (ref 98–111)
Creatinine, Ser: 0.85 mg/dL (ref 0.61–1.24)
GFR, Estimated: >60 mL/min (ref >60)
Glucose, Bld: 122 mg/dL — ABNORMAL HIGH (ref 70–99)
Phosphorus: 3.5 mg/dL (ref 2.5–4.6)
Potassium: 4.9 mmol/L (ref 3.5–5.1)
Sodium: 145 mmol/L (ref 135–145)

## 2020-07-29 LAB — MAGNESIUM: Magnesium: 1.9 mg/dL (ref 1.7–2.4)

## 2020-08-01 LAB — BASIC METABOLIC PANEL
Anion gap: 9 (ref 5–15)
BUN: 100 mg/dL — ABNORMAL HIGH (ref 8–23)
CO2: 38 mmol/L — ABNORMAL HIGH (ref 22–32)
Calcium: 10.9 mg/dL — ABNORMAL HIGH (ref 8.9–10.3)
Chloride: 105 mmol/L (ref 98–111)
Creatinine, Ser: 1.08 mg/dL (ref 0.61–1.24)
GFR, Estimated: >60 mL/min (ref >60)
Glucose, Bld: 128 mg/dL — ABNORMAL HIGH (ref 70–99)
Potassium: 4.8 mmol/L (ref 3.5–5.1)
Sodium: 152 mmol/L — ABNORMAL HIGH (ref 135–145)

## 2020-08-01 LAB — MAGNESIUM: Magnesium: 2.3 mg/dL (ref 1.7–2.4)

## 2020-08-01 LAB — CBC
HCT: 40.1 % (ref 39.0–52.0)
Hemoglobin: 11.8 g/dL — ABNORMAL LOW (ref 13.0–17.0)
MCH: 28.5 pg (ref 26.0–34.0)
MCHC: 29.4 g/dL — ABNORMAL LOW (ref 30.0–36.0)
MCV: 96.9 fL (ref 80.0–100.0)
Platelets: 301 10*3/uL (ref 150–400)
RBC: 4.14 MIL/uL — ABNORMAL LOW (ref 4.22–5.81)
RDW: 22.5 % — ABNORMAL HIGH (ref 11.5–15.5)
WBC: 11.6 10*3/uL — ABNORMAL HIGH (ref 4.0–10.5)
nRBC: 0 % (ref 0.0–0.2)

## 2020-08-01 LAB — PHOSPHORUS: Phosphorus: 3.8 mg/dL (ref 2.5–4.6)

## 2020-08-02 LAB — MAGNESIUM: Magnesium: 2.2 mg/dL (ref 1.7–2.4)

## 2020-08-02 LAB — BASIC METABOLIC PANEL
Anion gap: 8 (ref 5–15)
BUN: 115 mg/dL — ABNORMAL HIGH (ref 8–23)
CO2: 39 mmol/L — ABNORMAL HIGH (ref 22–32)
Calcium: 10.4 mg/dL — ABNORMAL HIGH (ref 8.9–10.3)
Chloride: 101 mmol/L (ref 98–111)
Creatinine, Ser: 1.25 mg/dL — ABNORMAL HIGH (ref 0.61–1.24)
GFR, Estimated: >60 mL/min (ref >60)
Glucose, Bld: 98 mg/dL (ref 70–99)
Potassium: 4.8 mmol/L (ref 3.5–5.1)
Sodium: 148 mmol/L — ABNORMAL HIGH (ref 135–145)

## 2020-08-02 LAB — PHOSPHORUS: Phosphorus: 3.6 mg/dL (ref 2.5–4.6)

## 2020-08-03 LAB — RENAL FUNCTION PANEL
Albumin: 3 g/dL — ABNORMAL LOW (ref 3.5–5.0)
Anion gap: 8 (ref 5–15)
BUN: 102 mg/dL — ABNORMAL HIGH (ref 8–23)
CO2: 40 mmol/L — ABNORMAL HIGH (ref 22–32)
Calcium: 10.4 mg/dL — ABNORMAL HIGH (ref 8.9–10.3)
Chloride: 94 mmol/L — ABNORMAL LOW (ref 98–111)
Creatinine, Ser: 1.05 mg/dL (ref 0.61–1.24)
GFR, Estimated: >60 mL/min (ref >60)
Glucose, Bld: 109 mg/dL — ABNORMAL HIGH (ref 70–99)
Phosphorus: 3.5 mg/dL (ref 2.5–4.6)
Potassium: 5.1 mmol/L (ref 3.5–5.1)
Sodium: 142 mmol/L (ref 135–145)

## 2020-08-03 LAB — MAGNESIUM: Magnesium: 2.4 mg/dL (ref 1.7–2.4)

## 2020-08-05 LAB — BASIC METABOLIC PANEL
Anion gap: 6 (ref 5–15)
BUN: 68 mg/dL — ABNORMAL HIGH (ref 8–23)
CO2: 34 mmol/L — ABNORMAL HIGH (ref 22–32)
Calcium: 10 mg/dL (ref 8.9–10.3)
Chloride: 99 mmol/L (ref 98–111)
Creatinine, Ser: 0.93 mg/dL (ref 0.61–1.24)
GFR, Estimated: >60 mL/min (ref >60)
Glucose, Bld: 97 mg/dL (ref 70–99)
Potassium: 5 mmol/L (ref 3.5–5.1)
Sodium: 139 mmol/L (ref 135–145)

## 2020-08-05 LAB — CBC
HCT: 33.4 % — ABNORMAL LOW (ref 39.0–52.0)
Hemoglobin: 9.8 g/dL — ABNORMAL LOW (ref 13.0–17.0)
MCH: 28.2 pg (ref 26.0–34.0)
MCHC: 29.3 g/dL — ABNORMAL LOW (ref 30.0–36.0)
MCV: 96.3 fL (ref 80.0–100.0)
Platelets: 217 10*3/uL (ref 150–400)
RBC: 3.47 MIL/uL — ABNORMAL LOW (ref 4.22–5.81)
RDW: 21.8 % — ABNORMAL HIGH (ref 11.5–15.5)
WBC: 8.4 10*3/uL (ref 4.0–10.5)
nRBC: 0 % (ref 0.0–0.2)

## 2020-08-06 LAB — CULTURE, RESPIRATORY W GRAM STAIN

## 2020-08-08 LAB — RENAL FUNCTION PANEL
Albumin: 2.8 g/dL — ABNORMAL LOW (ref 3.5–5.0)
Anion gap: 9 (ref 5–15)
BUN: 49 mg/dL — ABNORMAL HIGH (ref 8–23)
CO2: 29 mmol/L (ref 22–32)
Calcium: 10.2 mg/dL (ref 8.9–10.3)
Chloride: 103 mmol/L (ref 98–111)
Creatinine, Ser: 0.84 mg/dL (ref 0.61–1.24)
GFR, Estimated: >60 mL/min (ref >60)
Glucose, Bld: 97 mg/dL (ref 70–99)
Phosphorus: 4.4 mg/dL (ref 2.5–4.6)
Potassium: 5.1 mmol/L (ref 3.5–5.1)
Sodium: 141 mmol/L (ref 135–145)

## 2020-08-08 LAB — CBC
HCT: 34.6 % — ABNORMAL LOW (ref 39.0–52.0)
Hemoglobin: 10.3 g/dL — ABNORMAL LOW (ref 13.0–17.0)
MCH: 28.4 pg (ref 26.0–34.0)
MCHC: 29.8 g/dL — ABNORMAL LOW (ref 30.0–36.0)
MCV: 95.3 fL (ref 80.0–100.0)
Platelets: 215 10*3/uL (ref 150–400)
RBC: 3.63 MIL/uL — ABNORMAL LOW (ref 4.22–5.81)
RDW: 20.9 % — ABNORMAL HIGH (ref 11.5–15.5)
WBC: 9.2 10*3/uL (ref 4.0–10.5)
nRBC: 0 % (ref 0.0–0.2)

## 2020-08-08 LAB — MAGNESIUM: Magnesium: 2.1 mg/dL (ref 1.7–2.4)

## 2020-08-12 LAB — RENAL FUNCTION PANEL
Albumin: 2.9 g/dL — ABNORMAL LOW (ref 3.5–5.0)
Anion gap: 7 (ref 5–15)
BUN: 43 mg/dL — ABNORMAL HIGH (ref 8–23)
CO2: 27 mmol/L (ref 22–32)
Calcium: 10 mg/dL (ref 8.9–10.3)
Chloride: 100 mmol/L (ref 98–111)
Creatinine, Ser: 0.83 mg/dL (ref 0.61–1.24)
GFR, Estimated: >60 mL/min (ref >60)
Glucose, Bld: 84 mg/dL (ref 70–99)
Phosphorus: 4.1 mg/dL (ref 2.5–4.6)
Potassium: 5.6 mmol/L — ABNORMAL HIGH (ref 3.5–5.1)
Sodium: 134 mmol/L — ABNORMAL LOW (ref 135–145)

## 2020-08-12 LAB — MAGNESIUM: Magnesium: 2 mg/dL (ref 1.7–2.4)

## 2020-08-12 LAB — CBC
HCT: 35.4 % — ABNORMAL LOW (ref 39.0–52.0)
Hemoglobin: 10.8 g/dL — ABNORMAL LOW (ref 13.0–17.0)
MCH: 28.6 pg (ref 26.0–34.0)
MCHC: 30.5 g/dL (ref 30.0–36.0)
MCV: 93.7 fL (ref 80.0–100.0)
Platelets: 253 10*3/uL (ref 150–400)
RBC: 3.78 MIL/uL — ABNORMAL LOW (ref 4.22–5.81)
RDW: 19.6 % — ABNORMAL HIGH (ref 11.5–15.5)
WBC: 8.8 10*3/uL (ref 4.0–10.5)
nRBC: 0 % (ref 0.0–0.2)

## 2020-08-13 ENCOUNTER — Other Ambulatory Visit (HOSPITAL_COMMUNITY): Payer: Self-pay

## 2020-08-13 LAB — RENAL FUNCTION PANEL
Albumin: 3 g/dL — ABNORMAL LOW (ref 3.5–5.0)
Anion gap: 7 (ref 5–15)
BUN: 49 mg/dL — ABNORMAL HIGH (ref 8–23)
CO2: 28 mmol/L (ref 22–32)
Calcium: 9.7 mg/dL (ref 8.9–10.3)
Chloride: 100 mmol/L (ref 98–111)
Creatinine, Ser: 0.8 mg/dL (ref 0.61–1.24)
GFR, Estimated: >60 mL/min (ref >60)
Glucose, Bld: 93 mg/dL (ref 70–99)
Phosphorus: 3.4 mg/dL (ref 2.5–4.6)
Potassium: 4.8 mmol/L (ref 3.5–5.1)
Sodium: 135 mmol/L (ref 135–145)

## 2020-08-13 LAB — MAGNESIUM: Magnesium: 1.8 mg/dL (ref 1.7–2.4)

## 2020-08-14 ENCOUNTER — Other Ambulatory Visit (HOSPITAL_COMMUNITY): Payer: Self-pay

## 2020-08-14 HISTORY — PX: IR REPLACE G-TUBE SIMPLE WO FLUORO: IMG2323

## 2020-08-14 NOTE — Progress Notes (Signed)
G-tube clogged. 16 Fr balloon retention tube, last replaced 4/11 now clogged.  Unable to successfully declog at bedisde. Bedside exchange for new 16 Fr Entuit balloon retention gastrostomy. Balloon inflated with 6 mL Flushes easily. Will check abdomen PEG tube xray to confirm position.  Ascencion Dike PA-C Interventional Radiology 08/14/2020 10:34 AM

## 2020-08-15 LAB — BASIC METABOLIC PANEL
Anion gap: 8 (ref 5–15)
BUN: 40 mg/dL — ABNORMAL HIGH (ref 8–23)
CO2: 28 mmol/L (ref 22–32)
Calcium: 9.6 mg/dL (ref 8.9–10.3)
Chloride: 101 mmol/L (ref 98–111)
Creatinine, Ser: 0.91 mg/dL (ref 0.61–1.24)
GFR, Estimated: >60 mL/min (ref >60)
Glucose, Bld: 107 mg/dL — ABNORMAL HIGH (ref 70–99)
Potassium: 4.6 mmol/L (ref 3.5–5.1)
Sodium: 137 mmol/L (ref 135–145)

## 2020-08-15 LAB — CBC
HCT: 34.7 % — ABNORMAL LOW (ref 39.0–52.0)
Hemoglobin: 10.8 g/dL — ABNORMAL LOW (ref 13.0–17.0)
MCH: 29 pg (ref 26.0–34.0)
MCHC: 31.1 g/dL (ref 30.0–36.0)
MCV: 93.3 fL (ref 80.0–100.0)
Platelets: 259 10*3/uL (ref 150–400)
RBC: 3.72 MIL/uL — ABNORMAL LOW (ref 4.22–5.81)
RDW: 19.4 % — ABNORMAL HIGH (ref 11.5–15.5)
WBC: 14.3 10*3/uL — ABNORMAL HIGH (ref 4.0–10.5)
nRBC: 0 % (ref 0.0–0.2)

## 2020-08-15 LAB — MAGNESIUM: Magnesium: 1.8 mg/dL (ref 1.7–2.4)

## 2020-08-15 LAB — PHOSPHORUS: Phosphorus: 3.2 mg/dL (ref 2.5–4.6)

## 2020-08-16 ENCOUNTER — Other Ambulatory Visit (HOSPITAL_COMMUNITY): Payer: Self-pay

## 2020-08-17 LAB — CBC
HCT: 36 % — ABNORMAL LOW (ref 39.0–52.0)
Hemoglobin: 11 g/dL — ABNORMAL LOW (ref 13.0–17.0)
MCH: 28.8 pg (ref 26.0–34.0)
MCHC: 30.6 g/dL (ref 30.0–36.0)
MCV: 94.2 fL (ref 80.0–100.0)
Platelets: 258 10*3/uL (ref 150–400)
RBC: 3.82 MIL/uL — ABNORMAL LOW (ref 4.22–5.81)
RDW: 18.8 % — ABNORMAL HIGH (ref 11.5–15.5)
WBC: 11.8 10*3/uL — ABNORMAL HIGH (ref 4.0–10.5)
nRBC: 0 % (ref 0.0–0.2)

## 2020-08-20 ENCOUNTER — Other Ambulatory Visit (HOSPITAL_COMMUNITY): Payer: Self-pay

## 2020-08-20 LAB — CBC
HCT: 34.3 % — ABNORMAL LOW (ref 39.0–52.0)
Hemoglobin: 10.3 g/dL — ABNORMAL LOW (ref 13.0–17.0)
MCH: 28.6 pg (ref 26.0–34.0)
MCHC: 30 g/dL (ref 30.0–36.0)
MCV: 95.3 fL (ref 80.0–100.0)
Platelets: 329 10*3/uL (ref 150–400)
RBC: 3.6 MIL/uL — ABNORMAL LOW (ref 4.22–5.81)
RDW: 18.4 % — ABNORMAL HIGH (ref 11.5–15.5)
WBC: 9.1 10*3/uL (ref 4.0–10.5)
nRBC: 0.3 % — ABNORMAL HIGH (ref 0.0–0.2)

## 2020-08-20 LAB — BASIC METABOLIC PANEL
Anion gap: 8 (ref 5–15)
BUN: 40 mg/dL — ABNORMAL HIGH (ref 8–23)
CO2: 34 mmol/L — ABNORMAL HIGH (ref 22–32)
Calcium: 10.3 mg/dL (ref 8.9–10.3)
Chloride: 97 mmol/L — ABNORMAL LOW (ref 98–111)
Creatinine, Ser: 0.89 mg/dL (ref 0.61–1.24)
GFR, Estimated: >60 mL/min (ref >60)
Glucose, Bld: 94 mg/dL (ref 70–99)
Potassium: 3.5 mmol/L (ref 3.5–5.1)
Sodium: 139 mmol/L (ref 135–145)

## 2020-08-20 LAB — MAGNESIUM: Magnesium: 2.1 mg/dL (ref 1.7–2.4)

## 2020-08-20 MED ORDER — IOHEXOL 300 MG/ML  SOLN
50.0000 mL | Freq: Once | INTRAMUSCULAR | Status: AC | PRN
  Administered 2020-08-20: 30 mL

## 2020-08-20 NOTE — Progress Notes (Signed)
Request for G tube replacement due to a deflated balloon.   G tube was evaluated at the bedside, the balloon was able to be inflated with normal saline.   The G tube was placed back into patient's stomach, balloon was inflated with 7 cc NS.  PEG tube xray ordered, result pending.    Armando Gang Setsuko Robins PA-C 08/20/2020 1:34 PM

## 2020-08-22 LAB — CULTURE, RESPIRATORY W GRAM STAIN

## 2020-08-23 LAB — CBC
HCT: 38.2 % — ABNORMAL LOW (ref 39.0–52.0)
Hemoglobin: 11.6 g/dL — ABNORMAL LOW (ref 13.0–17.0)
MCH: 28.6 pg (ref 26.0–34.0)
MCHC: 30.4 g/dL (ref 30.0–36.0)
MCV: 94.3 fL (ref 80.0–100.0)
Platelets: 318 10*3/uL (ref 150–400)
RBC: 4.05 MIL/uL — ABNORMAL LOW (ref 4.22–5.81)
RDW: 18.1 % — ABNORMAL HIGH (ref 11.5–15.5)
WBC: 10.5 10*3/uL (ref 4.0–10.5)
nRBC: 0 % (ref 0.0–0.2)

## 2020-08-23 LAB — BASIC METABOLIC PANEL
Anion gap: 9 (ref 5–15)
BUN: 28 mg/dL — ABNORMAL HIGH (ref 8–23)
CO2: 29 mmol/L (ref 22–32)
Calcium: 10.9 mg/dL — ABNORMAL HIGH (ref 8.9–10.3)
Chloride: 102 mmol/L (ref 98–111)
Creatinine, Ser: 0.75 mg/dL (ref 0.61–1.24)
GFR, Estimated: >60 mL/min (ref >60)
Glucose, Bld: 94 mg/dL (ref 70–99)
Potassium: 3.4 mmol/L — ABNORMAL LOW (ref 3.5–5.1)
Sodium: 140 mmol/L (ref 135–145)

## 2020-08-23 LAB — MAGNESIUM: Magnesium: 1.9 mg/dL (ref 1.7–2.4)

## 2020-08-23 LAB — PHOSPHORUS: Phosphorus: 2.5 mg/dL (ref 2.5–4.6)

## 2020-08-24 LAB — POTASSIUM: Potassium: 3.7 mmol/L (ref 3.5–5.1)

## 2020-08-25 ENCOUNTER — Other Ambulatory Visit (HOSPITAL_COMMUNITY): Payer: Self-pay

## 2020-08-25 HISTORY — PX: IR REPLC GASTRO/COLONIC TUBE PERCUT W/FLUORO: IMG2333

## 2020-08-25 MED ORDER — IOHEXOL 180 MG/ML  SOLN
20.0000 mL | Freq: Once | INTRAMUSCULAR | Status: AC | PRN
  Administered 2020-08-25: 10 mL

## 2020-08-25 MED ORDER — LIDOCAINE VISCOUS HCL 2 % MT SOLN
OROMUCOSAL | Status: AC
  Filled 2020-08-25: qty 15

## 2020-08-25 NOTE — Procedures (Signed)
Interventional Radiology Procedure Note  Procedure:   Replacement of displaced gtube.  New 62F entuit balloon retention placed.   Complications: None  Recommendations:  - Ok to use   Signed,  Dulcy Fanny. Earleen Newport, DO

## 2020-08-27 LAB — BASIC METABOLIC PANEL
Anion gap: 7 (ref 5–15)
BUN: 38 mg/dL — ABNORMAL HIGH (ref 8–23)
CO2: 32 mmol/L (ref 22–32)
Calcium: 10 mg/dL (ref 8.9–10.3)
Chloride: 98 mmol/L (ref 98–111)
Creatinine, Ser: 0.75 mg/dL (ref 0.61–1.24)
GFR, Estimated: >60 mL/min (ref >60)
Glucose, Bld: 96 mg/dL (ref 70–99)
Potassium: 3.7 mmol/L (ref 3.5–5.1)
Sodium: 137 mmol/L (ref 135–145)

## 2020-08-27 LAB — CBC
HCT: 35 % — ABNORMAL LOW (ref 39.0–52.0)
Hemoglobin: 10.7 g/dL — ABNORMAL LOW (ref 13.0–17.0)
MCH: 28.8 pg (ref 26.0–34.0)
MCHC: 30.6 g/dL (ref 30.0–36.0)
MCV: 94.1 fL (ref 80.0–100.0)
Platelets: 296 10*3/uL (ref 150–400)
RBC: 3.72 MIL/uL — ABNORMAL LOW (ref 4.22–5.81)
RDW: 17.9 % — ABNORMAL HIGH (ref 11.5–15.5)
WBC: 10 10*3/uL (ref 4.0–10.5)
nRBC: 0 % (ref 0.0–0.2)

## 2020-08-27 LAB — MAGNESIUM: Magnesium: 1.9 mg/dL (ref 1.7–2.4)

## 2020-08-27 LAB — PHOSPHORUS: Phosphorus: 3.4 mg/dL (ref 2.5–4.6)

## 2020-08-31 LAB — CBC
HCT: 38 % — ABNORMAL LOW (ref 39.0–52.0)
Hemoglobin: 11.3 g/dL — ABNORMAL LOW (ref 13.0–17.0)
MCH: 28.5 pg (ref 26.0–34.0)
MCHC: 29.7 g/dL — ABNORMAL LOW (ref 30.0–36.0)
MCV: 95.7 fL (ref 80.0–100.0)
Platelets: 278 10*3/uL (ref 150–400)
RBC: 3.97 MIL/uL — ABNORMAL LOW (ref 4.22–5.81)
RDW: 17.4 % — ABNORMAL HIGH (ref 11.5–15.5)
WBC: 8.9 10*3/uL (ref 4.0–10.5)
nRBC: 0 % (ref 0.0–0.2)

## 2020-08-31 LAB — BASIC METABOLIC PANEL
Anion gap: 6 (ref 5–15)
BUN: 48 mg/dL — ABNORMAL HIGH (ref 8–23)
CO2: 32 mmol/L (ref 22–32)
Calcium: 10.9 mg/dL — ABNORMAL HIGH (ref 8.9–10.3)
Chloride: 102 mmol/L (ref 98–111)
Creatinine, Ser: 0.77 mg/dL (ref 0.61–1.24)
GFR, Estimated: >60 mL/min (ref >60)
Glucose, Bld: 86 mg/dL (ref 70–99)
Potassium: 4.2 mmol/L (ref 3.5–5.1)
Sodium: 140 mmol/L (ref 135–145)

## 2020-08-31 LAB — MAGNESIUM: Magnesium: 1.7 mg/dL (ref 1.7–2.4)

## 2020-09-02 LAB — MAGNESIUM: Magnesium: 1.8 mg/dL (ref 1.7–2.4)

## 2020-09-04 LAB — BASIC METABOLIC PANEL
Anion gap: 8 (ref 5–15)
BUN: 60 mg/dL — ABNORMAL HIGH (ref 8–23)
CO2: 30 mmol/L (ref 22–32)
Calcium: 9.9 mg/dL (ref 8.9–10.3)
Chloride: 100 mmol/L (ref 98–111)
Creatinine, Ser: 0.94 mg/dL (ref 0.61–1.24)
GFR, Estimated: >60 mL/min (ref >60)
Glucose, Bld: 100 mg/dL — ABNORMAL HIGH (ref 70–99)
Potassium: 5.1 mmol/L (ref 3.5–5.1)
Sodium: 138 mmol/L (ref 135–145)

## 2020-09-04 LAB — CBC
HCT: 37 % — ABNORMAL LOW (ref 39.0–52.0)
Hemoglobin: 11.4 g/dL — ABNORMAL LOW (ref 13.0–17.0)
MCH: 28.7 pg (ref 26.0–34.0)
MCHC: 30.8 g/dL (ref 30.0–36.0)
MCV: 93.2 fL (ref 80.0–100.0)
Platelets: 261 10*3/uL (ref 150–400)
RBC: 3.97 MIL/uL — ABNORMAL LOW (ref 4.22–5.81)
RDW: 16.9 % — ABNORMAL HIGH (ref 11.5–15.5)
WBC: 8.1 10*3/uL (ref 4.0–10.5)
nRBC: 0 % (ref 0.0–0.2)

## 2020-09-07 LAB — BASIC METABOLIC PANEL
Anion gap: 7 (ref 5–15)
BUN: 77 mg/dL — ABNORMAL HIGH (ref 8–23)
CO2: 33 mmol/L — ABNORMAL HIGH (ref 22–32)
Calcium: 10.8 mg/dL — ABNORMAL HIGH (ref 8.9–10.3)
Chloride: 100 mmol/L (ref 98–111)
Creatinine, Ser: 1.05 mg/dL (ref 0.61–1.24)
GFR, Estimated: >60 mL/min (ref >60)
Glucose, Bld: 83 mg/dL (ref 70–99)
Potassium: 4.8 mmol/L (ref 3.5–5.1)
Sodium: 140 mmol/L (ref 135–145)

## 2020-09-07 LAB — CBC
HCT: 41.7 % (ref 39.0–52.0)
Hemoglobin: 12.3 g/dL — ABNORMAL LOW (ref 13.0–17.0)
MCH: 28.4 pg (ref 26.0–34.0)
MCHC: 29.5 g/dL — ABNORMAL LOW (ref 30.0–36.0)
MCV: 96.3 fL (ref 80.0–100.0)
Platelets: 292 10*3/uL (ref 150–400)
RBC: 4.33 MIL/uL (ref 4.22–5.81)
RDW: 16.7 % — ABNORMAL HIGH (ref 11.5–15.5)
WBC: 8.9 10*3/uL (ref 4.0–10.5)
nRBC: 0 % (ref 0.0–0.2)

## 2020-09-07 LAB — MAGNESIUM: Magnesium: 2.4 mg/dL (ref 1.7–2.4)

## 2020-09-07 LAB — PHOSPHORUS: Phosphorus: 3.4 mg/dL (ref 2.5–4.6)

## 2020-09-11 LAB — MAGNESIUM: Magnesium: 2.5 mg/dL — ABNORMAL HIGH (ref 1.7–2.4)

## 2020-09-11 LAB — CBC
HCT: 40 % (ref 39.0–52.0)
Hemoglobin: 12.1 g/dL — ABNORMAL LOW (ref 13.0–17.0)
MCH: 28.6 pg (ref 26.0–34.0)
MCHC: 30.3 g/dL (ref 30.0–36.0)
MCV: 94.6 fL (ref 80.0–100.0)
Platelets: 242 10*3/uL (ref 150–400)
RBC: 4.23 MIL/uL (ref 4.22–5.81)
RDW: 16.2 % — ABNORMAL HIGH (ref 11.5–15.5)
WBC: 9.5 10*3/uL (ref 4.0–10.5)
nRBC: 0 % (ref 0.0–0.2)

## 2020-09-11 LAB — RENAL FUNCTION PANEL
Albumin: 3.4 g/dL — ABNORMAL LOW (ref 3.5–5.0)
Anion gap: 8 (ref 5–15)
BUN: 73 mg/dL — ABNORMAL HIGH (ref 8–23)
CO2: 32 mmol/L (ref 22–32)
Calcium: 10.6 mg/dL — ABNORMAL HIGH (ref 8.9–10.3)
Chloride: 104 mmol/L (ref 98–111)
Creatinine, Ser: 1.03 mg/dL (ref 0.61–1.24)
GFR, Estimated: >60 mL/min (ref >60)
Glucose, Bld: 107 mg/dL — ABNORMAL HIGH (ref 70–99)
Phosphorus: 2.9 mg/dL (ref 2.5–4.6)
Potassium: 5 mmol/L (ref 3.5–5.1)
Sodium: 144 mmol/L (ref 135–145)

## 2020-09-12 LAB — BASIC METABOLIC PANEL
Anion gap: 7 (ref 5–15)
BUN: 76 mg/dL — ABNORMAL HIGH (ref 8–23)
CO2: 29 mmol/L (ref 22–32)
Calcium: 10.3 mg/dL (ref 8.9–10.3)
Chloride: 105 mmol/L (ref 98–111)
Creatinine, Ser: 1.22 mg/dL (ref 0.61–1.24)
GFR, Estimated: >60 mL/min (ref >60)
Glucose, Bld: 120 mg/dL — ABNORMAL HIGH (ref 70–99)
Potassium: 4.2 mmol/L (ref 3.5–5.1)
Sodium: 141 mmol/L (ref 135–145)

## 2020-09-12 LAB — MAGNESIUM: Magnesium: 2.3 mg/dL (ref 1.7–2.4)

## 2020-09-14 ENCOUNTER — Other Ambulatory Visit (HOSPITAL_COMMUNITY): Payer: Self-pay

## 2020-09-14 MED ORDER — DIATRIZOATE MEGLUMINE & SODIUM 66-10 % PO SOLN
ORAL | Status: AC
  Filled 2020-09-14: qty 30

## 2020-09-14 NOTE — Procedures (Signed)
Pre procedural Dx: Dysphagia, poorly functioning feeding tube. Post procedural Dx: Same  Attempted to unclog existing 16 Fr balloon retention gastrostomy tube using stiff Glidewire at bedside - unable to pass wire beyond the insertion site.   8 cc of saline removed from balloon of existing 16 Fr gastrostomy tube and tube was removed in tact. A new 16 Fr balloon retention gastrostomy tube was placed in the existing tract, balloon was inflated with 7 cc saline, tubing cinched to skin and gastric contents aspirated.   KUB w/contrast to confirm location pending - do not use tube until this is resulted.  Per staff this is chronic issue and is a barrier to discharge, they have increased flushes already. Patient receiving Flomax via g-tube which is likely contributing to frequent clogs - recommend instilling warm water in tube and clamping for 15 minutes QD, can also consider use of pancreatic enzymes. If clogging continues to be an issue despite above measures then could consider up sizing to an 18 Fr balloon retention gastrostomy tube.  EBL: None  Complications: None immediate.  Candiss Norse, PA-C

## 2020-09-18 LAB — RENAL FUNCTION PANEL
Albumin: 3.4 g/dL — ABNORMAL LOW (ref 3.5–5.0)
Anion gap: 10 (ref 5–15)
BUN: 98 mg/dL — ABNORMAL HIGH (ref 8–23)
CO2: 32 mmol/L (ref 22–32)
Calcium: 10.8 mg/dL — ABNORMAL HIGH (ref 8.9–10.3)
Chloride: 103 mmol/L (ref 98–111)
Creatinine, Ser: 1.15 mg/dL (ref 0.61–1.24)
GFR, Estimated: >60 mL/min (ref >60)
Glucose, Bld: 86 mg/dL (ref 70–99)
Phosphorus: 4 mg/dL (ref 2.5–4.6)
Potassium: 5.2 mmol/L — ABNORMAL HIGH (ref 3.5–5.1)
Sodium: 145 mmol/L (ref 135–145)

## 2020-09-18 LAB — CBC
HCT: 41.8 % (ref 39.0–52.0)
Hemoglobin: 12.6 g/dL — ABNORMAL LOW (ref 13.0–17.0)
MCH: 29 pg (ref 26.0–34.0)
MCHC: 30.1 g/dL (ref 30.0–36.0)
MCV: 96.3 fL (ref 80.0–100.0)
Platelets: 216 10*3/uL (ref 150–400)
RBC: 4.34 MIL/uL (ref 4.22–5.81)
RDW: 15.6 % — ABNORMAL HIGH (ref 11.5–15.5)
WBC: 8 10*3/uL (ref 4.0–10.5)
nRBC: 0 % (ref 0.0–0.2)

## 2020-09-18 LAB — MAGNESIUM: Magnesium: 2.5 mg/dL — ABNORMAL HIGH (ref 1.7–2.4)

## 2020-09-19 LAB — POTASSIUM: Potassium: 4.7 mmol/L (ref 3.5–5.1)

## 2020-10-15 DEATH — deceased

## 2021-02-22 IMAGING — DX DG ABDOMEN 1V
1 series · 1 of 1 positions shown · non-contrast
Comparison: None.

CLINICAL DATA: Percutaneous gastrostomy tube placement.

EXAM:
ABDOMEN - 1 VIEW

[abdomen kub]
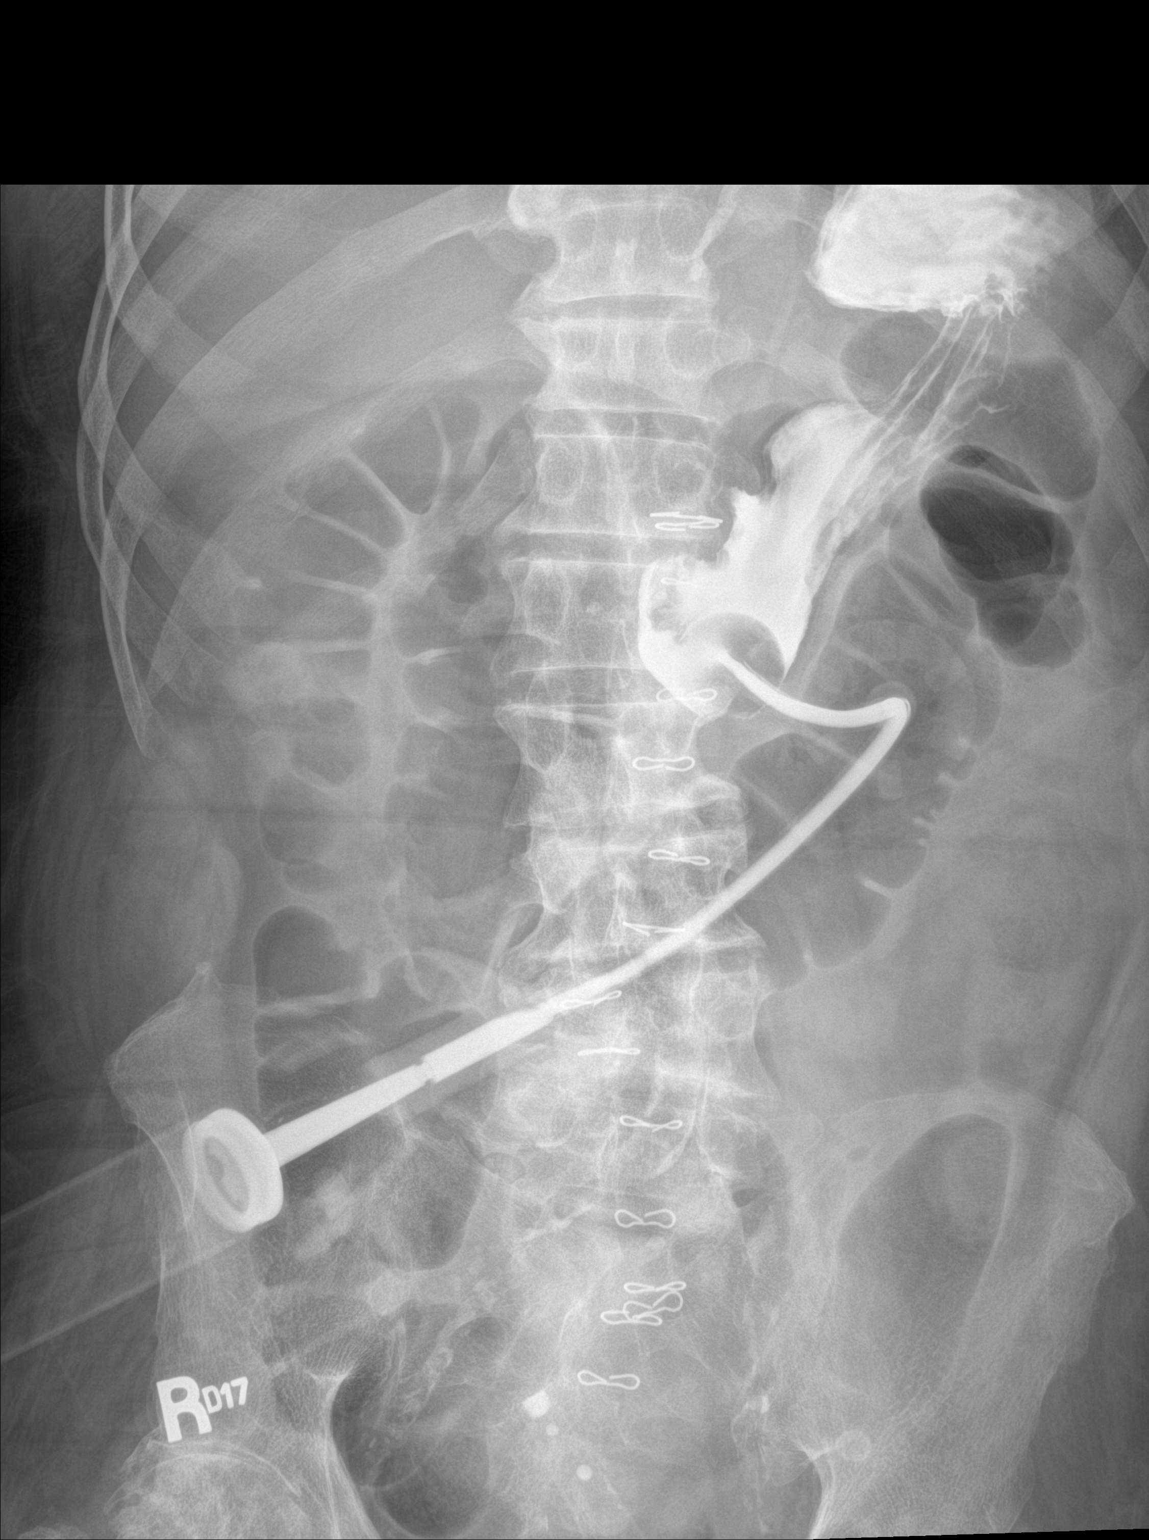

[1 of 1 positions shown; findings below may reference images not displayed]

FINDINGS: The bowel gas pattern is normal. A percutaneous gastrostomy tube is
seen with its distal tip overlying the gastric antrum. Radiopaque
contrast is noted throughout the stomach after injection into the
gastrostomy tube. There is no evidence of free air or contrast
extravasation. No radio-opaque calculi or other significant
radiographic abnormality are seen. Radiopaque surgical clips are
seen along the midline of the mid to lower abdomen and pelvis.
IMPRESSION: Percutaneous gastrostomy tube positioning, as described above.

## 2021-08-15 IMAGING — DX DG ABD PORTABLE 1V
1 series · 1 of 1 positions shown · non-contrast
Comparison: None.

CLINICAL DATA: Peg tube placement

EXAM:
PORTABLE ABDOMEN - 1 VIEW

[abdomen kub]
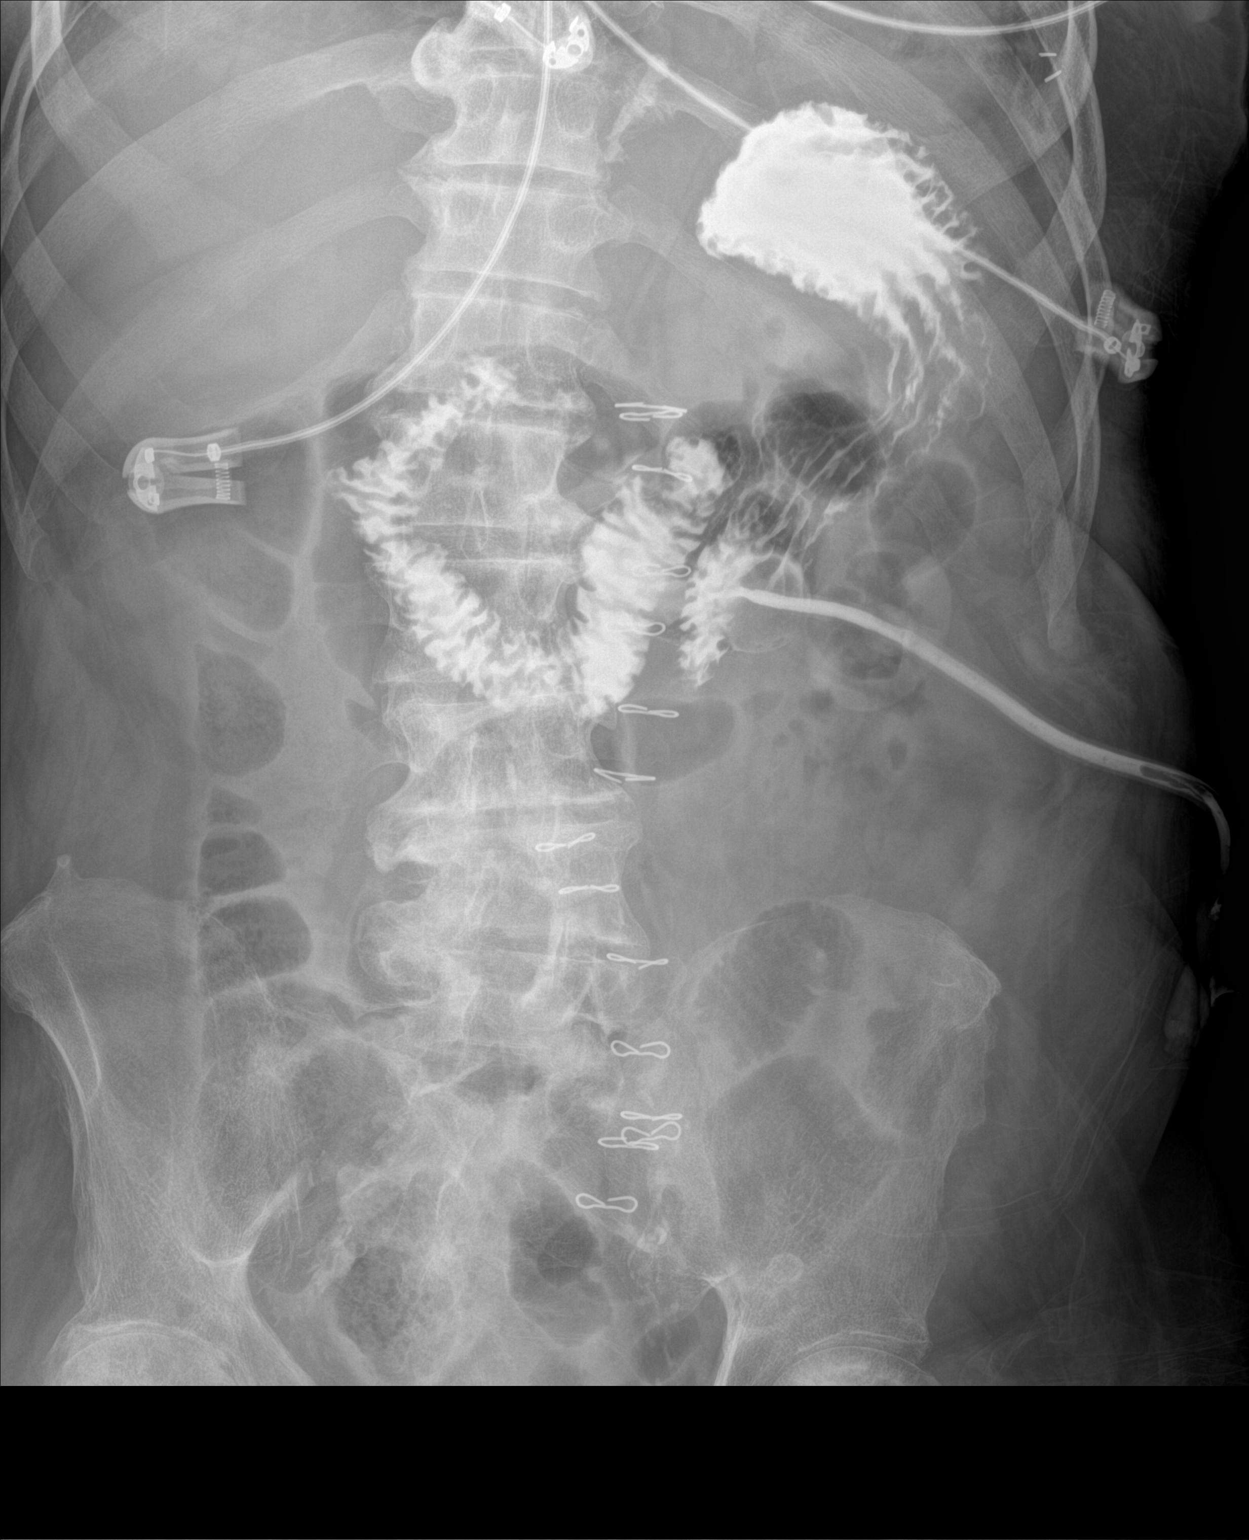

[1 of 1 positions shown; findings below may reference images not displayed]

FINDINGS: Gastrostomy tube projects over the left abdomen and stomach.
Contrast injected through the gastrostomy tube is seen within the
stomach and proximal small bowel. No contrast extravasation. No
bowel obstruction. No organomegaly or free air.
IMPRESSION: Peg tube within the stomach.  No contrast extravasation.

## 2021-08-16 IMAGING — DX DG CHEST 1V PORT
1 series · 1 of 1 positions shown · non-contrast
Comparison: 02/04/2019

CLINICAL DATA: Respiratory failure.

EXAM:
PORTABLE CHEST 1 VIEW

[chest ap]
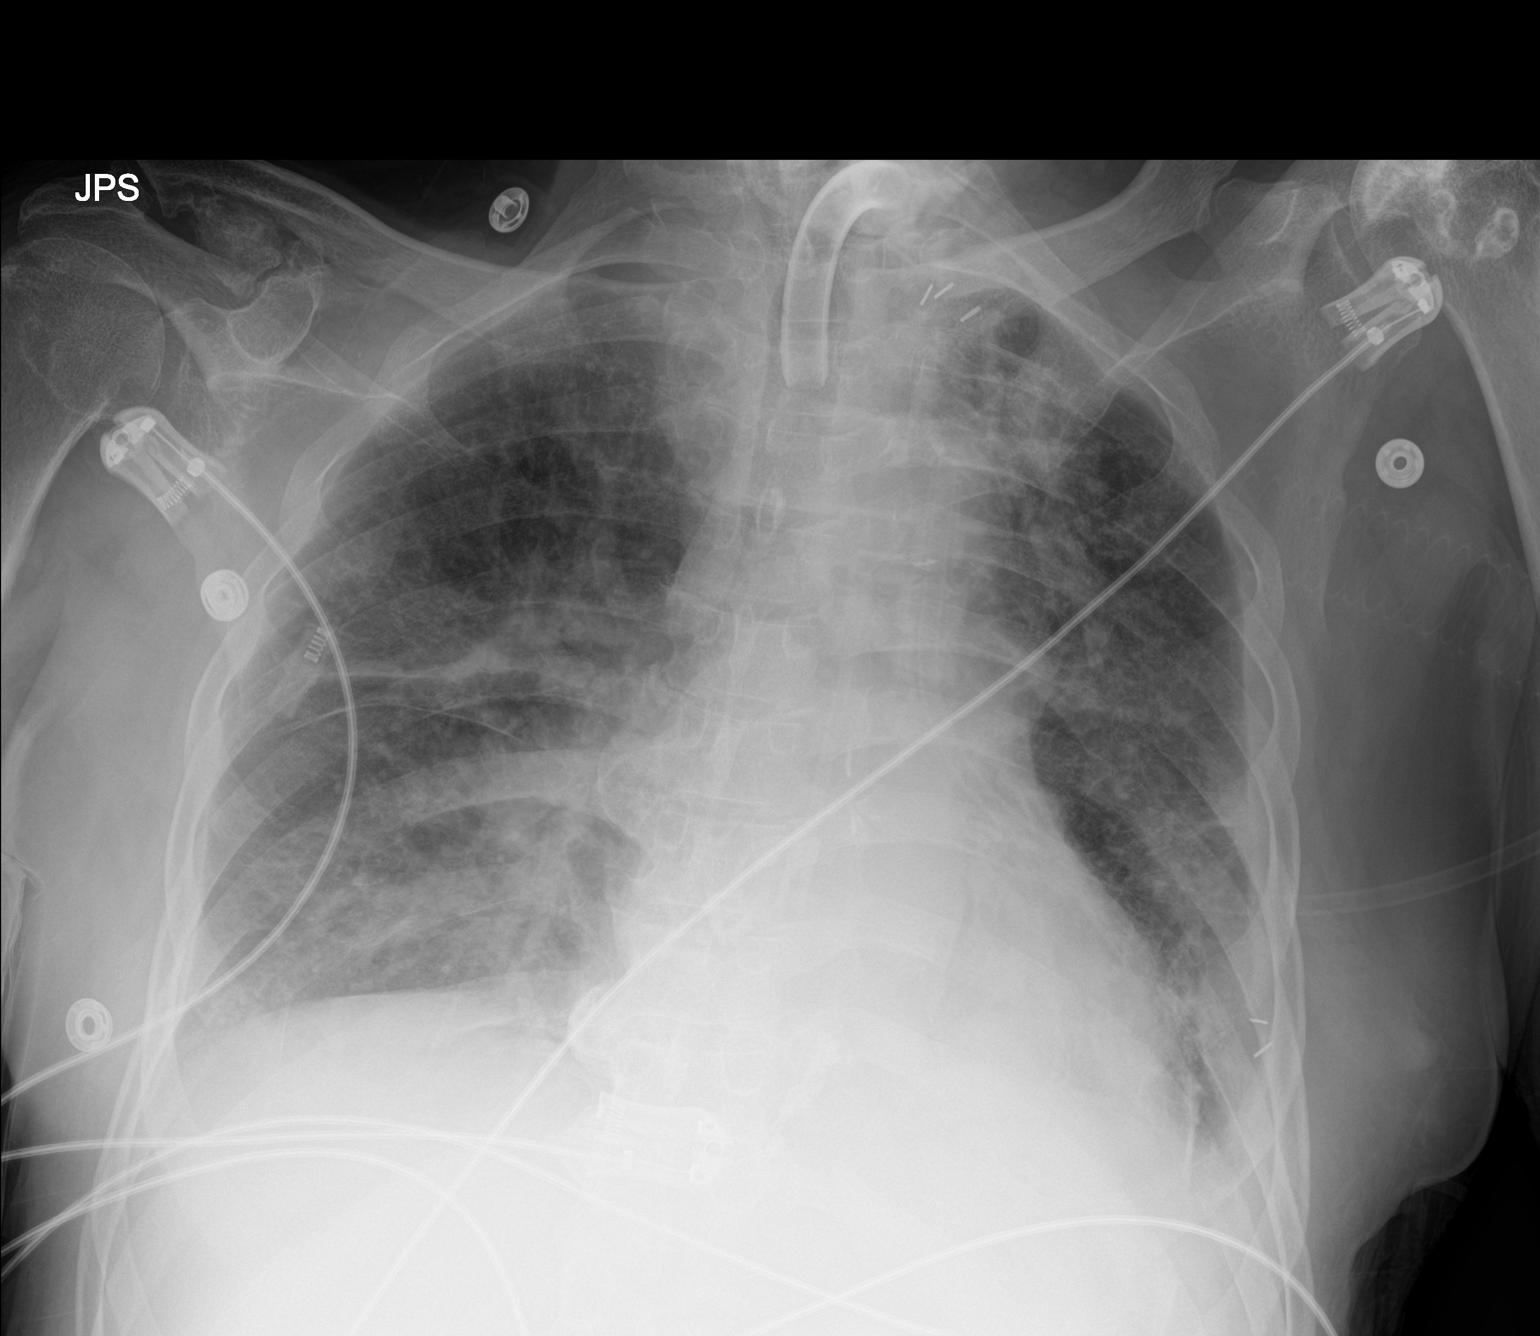

[1 of 1 positions shown; findings below may reference images not displayed]

FINDINGS: Generalized hazy appearance of the chest with superimposed
interstitial coarsening. Stable borderline heart size. Tracheostomy
tube in place. Postoperative left apex and left chest wall. No
visible effusion or pneumothorax. Osteonecrosis at the left humeral
head.
IMPRESSION: 1. No recent comparison.
2. Bilateral interstitial and airspace opacity with history of
pneumonia.

## 2021-08-19 IMAGING — DX DG ABDOMEN 1V
1 series · 1 of 1 positions shown · non-contrast
Comparison: None.

CLINICAL DATA: Peg tube confirmation

EXAM:
ABDOMEN - 1 VIEW

[abdomen supine]
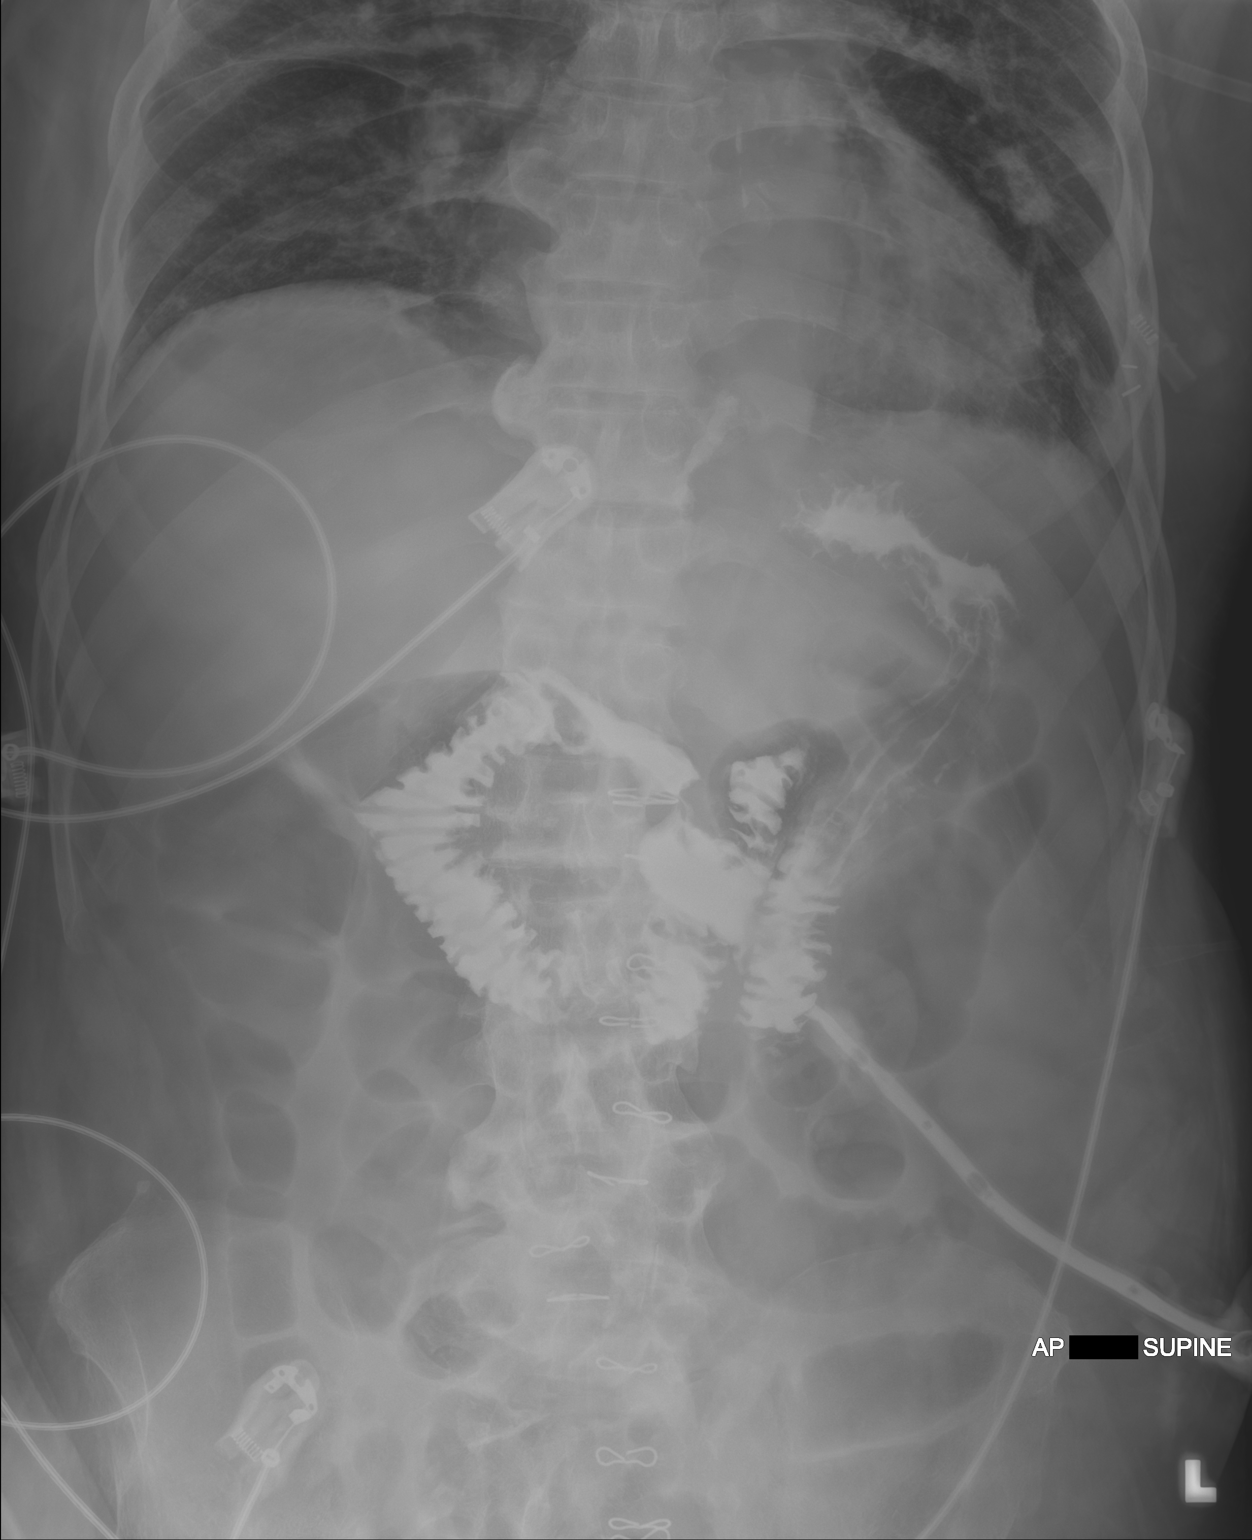

[1 of 1 positions shown; findings below may reference images not displayed]

FINDINGS: Nonobstructive pattern of bowel gas. Percutaneous ostomy tube
projects over the gastric body with contrast opacification of the
stomach, duodenum, and proximal jejunum.
IMPRESSION: Percutaneous ostomy tube with contrast opacification of the stomach,
duodenum, and proximal jejunum. Findings are in keeping with
intraluminal position of percutaneous gastrostomy tube.

## 2021-08-23 IMAGING — DX DG CHEST 1V PORT
1 series · 1 of 1 positions shown · non-contrast
Comparison: 07/07/2020, 10/16/2019

CLINICAL DATA: Pneumonia

EXAM:
PORTABLE CHEST 1 VIEW

[chest ap]
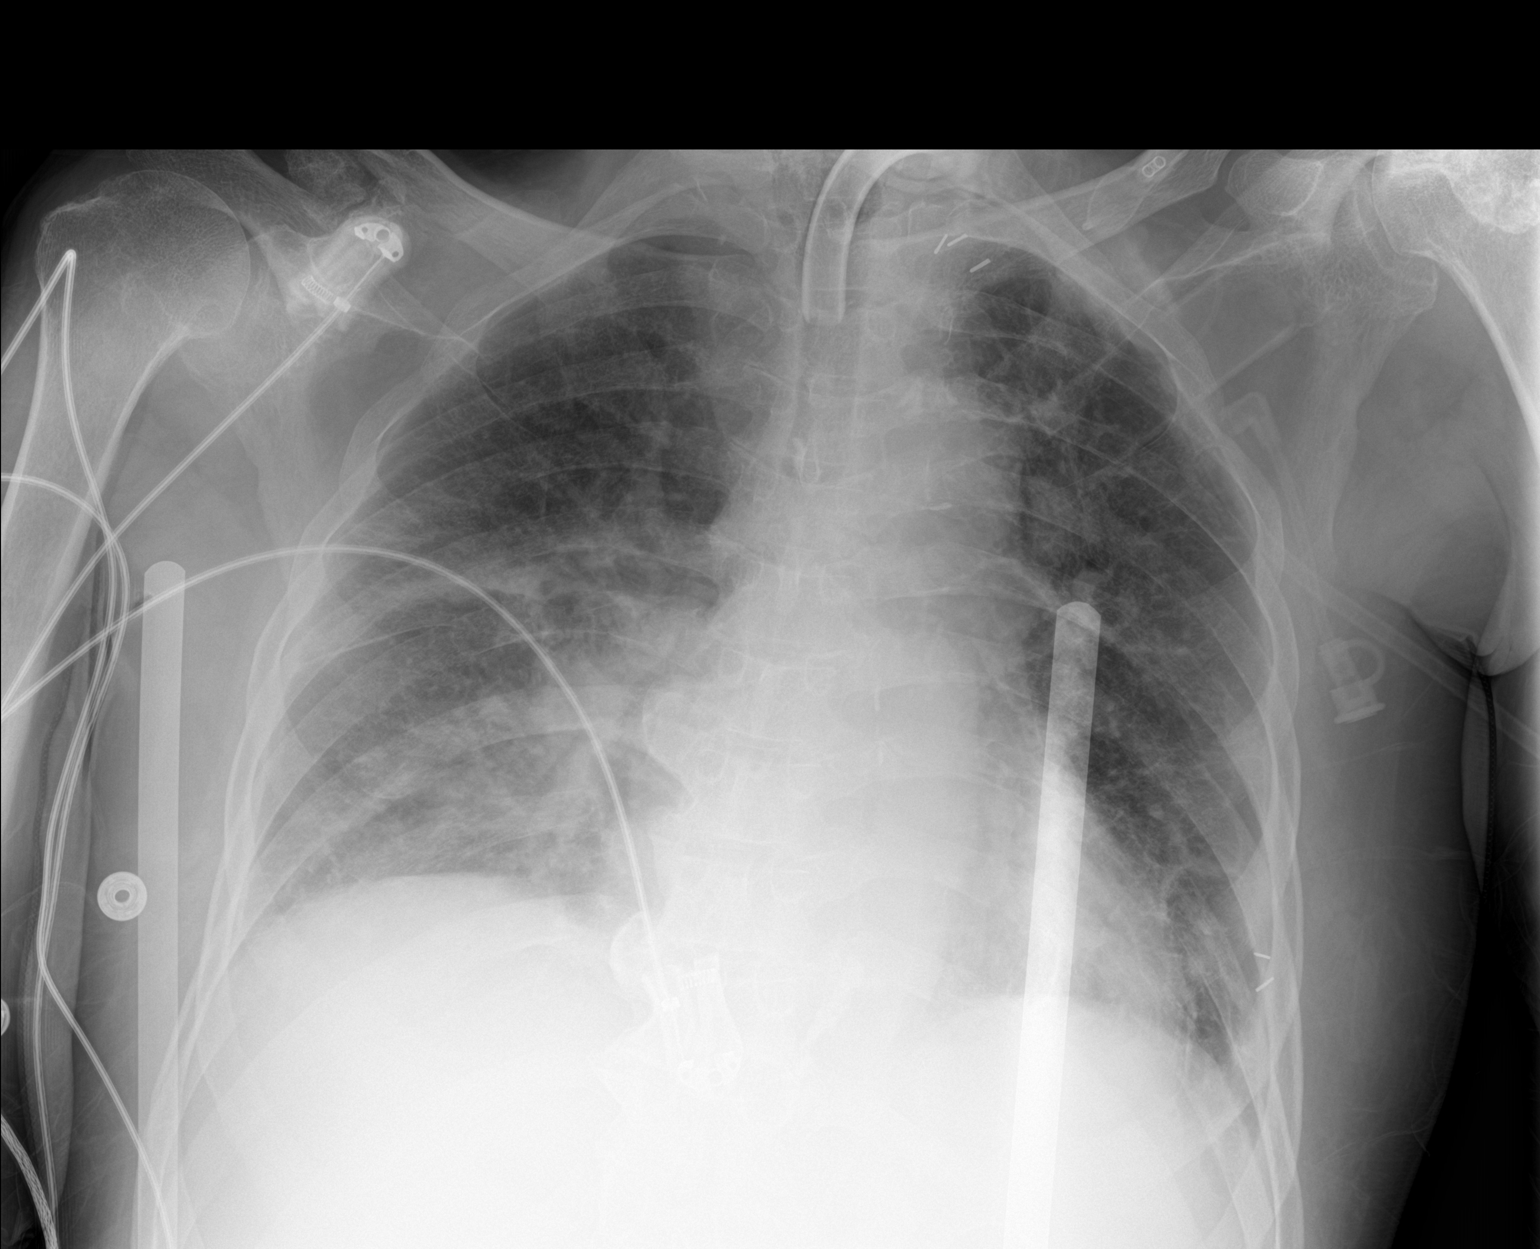

[1 of 1 positions shown; findings below may reference images not displayed]

FINDINGS: Stable right basilar focal pulmonary infiltrate in keeping with
acute lobar pneumonia. Minimal atelectasis or infiltrate within the
left lung base. No pneumothorax or pleural effusion. Tracheostomy in
unchanged position. Cardiac size within normal limits. Pulmonary
vascularity is normal.
IMPRESSION: Stable right basilar focal pulmonary pneumonic infiltrate

## 2021-08-26 IMAGING — DX DG ABDOMEN 1V
1 series · 1 of 1 positions shown · non-contrast
Comparison: Radiographs 07/10/2020 and 07/06/2020.  CT 01/21/2020.

CLINICAL DATA: PEG check.

EXAM:
ABDOMEN - 1 VIEW

[abdomen]
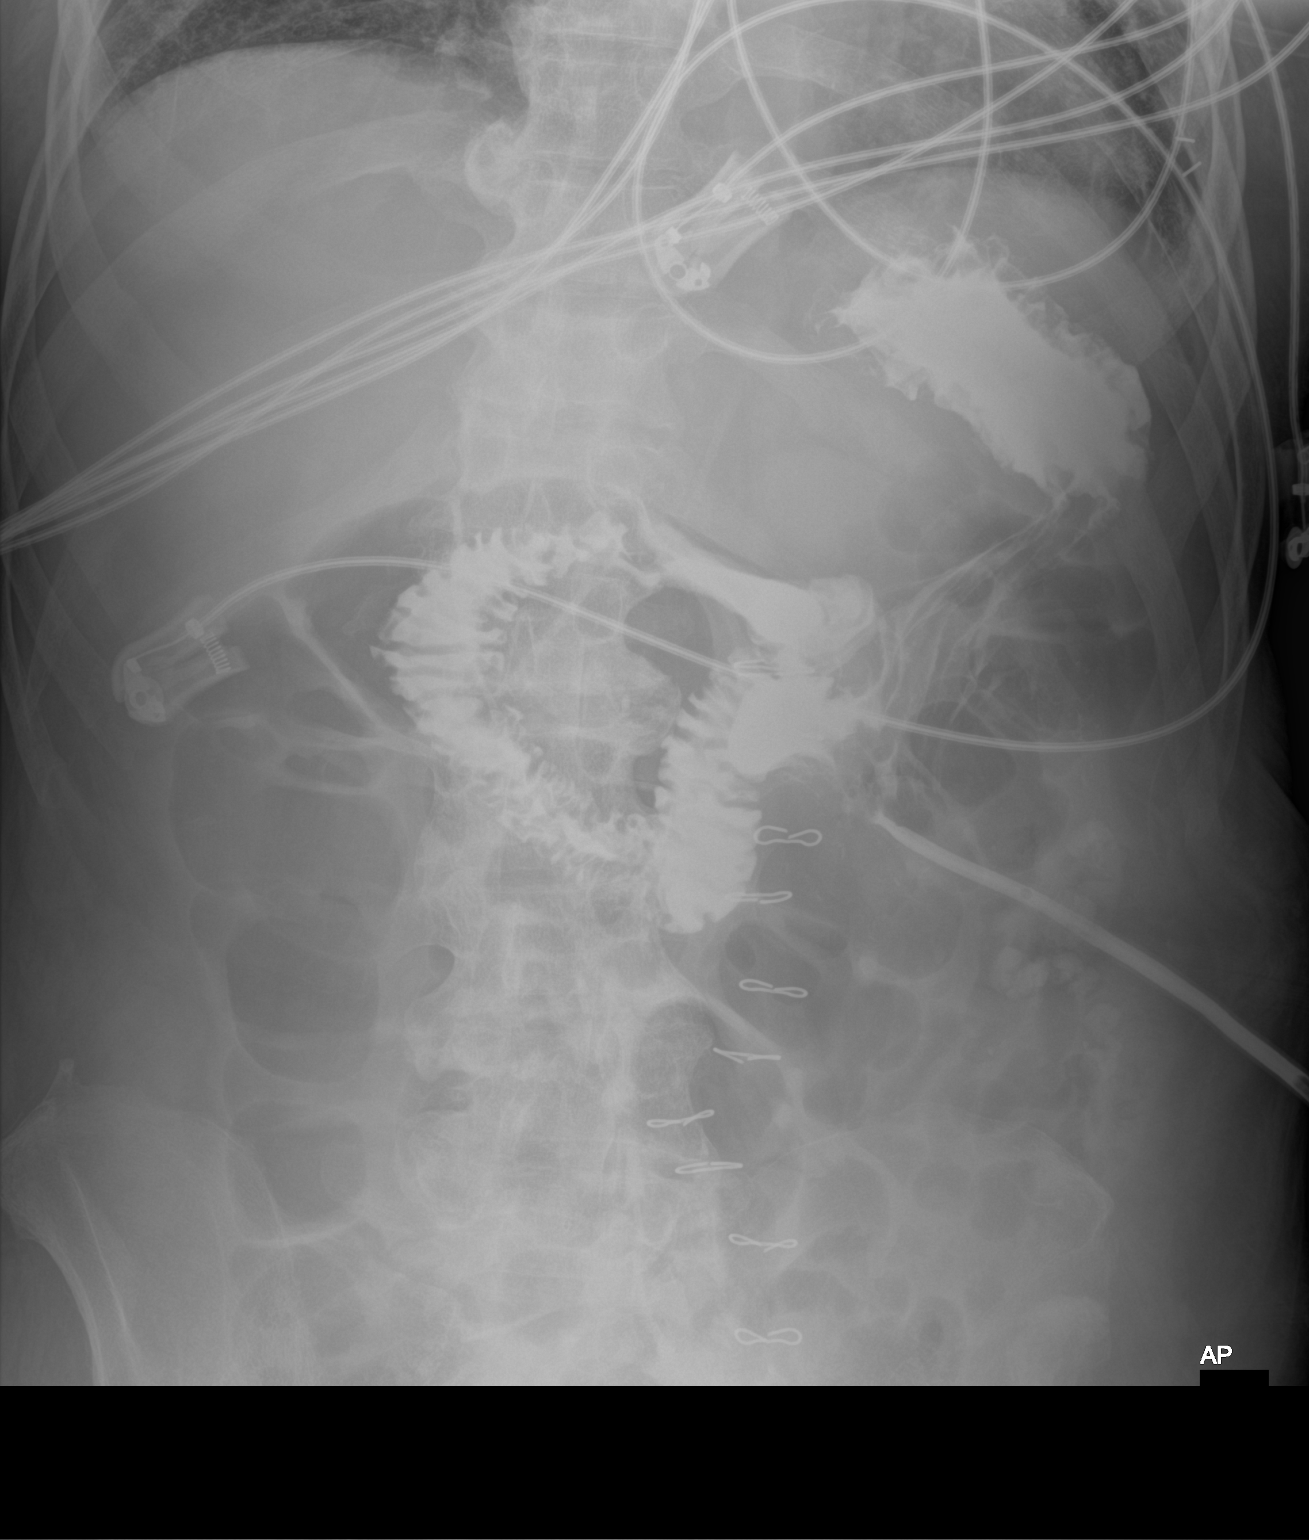

[1 of 1 positions shown; findings below may reference images not displayed]

FINDINGS: 4042 hours. Single supine view of the abdomen following contrast
administration through the patient's PEG tube. The tube appears
unchanged, projecting over the distal gastric body. There is
contrast opacification of the stomach and duodenum. No contrast
extravasation identified. The bowel gas pattern is normal.
Postsurgical changes are present in the anterior abdominal wall.
There is diffuse lumbar spondylosis.
IMPRESSION: Unchanged position of the percutaneous gastric feeding tube in the
distal stomach. No contrast extravasation.

## 2021-09-01 IMAGING — XA IR REPLACE G-TUBE/COLONIC TUBE
1 series · 19 of 21 positions shown · non-contrast
Comparison: none

INDICATION: 70-year-old male with malfunctioning gastrostomy.

[Series 300: ir gastrostomy tube removal · 19 of 21 slices shown]
[im 1/21]
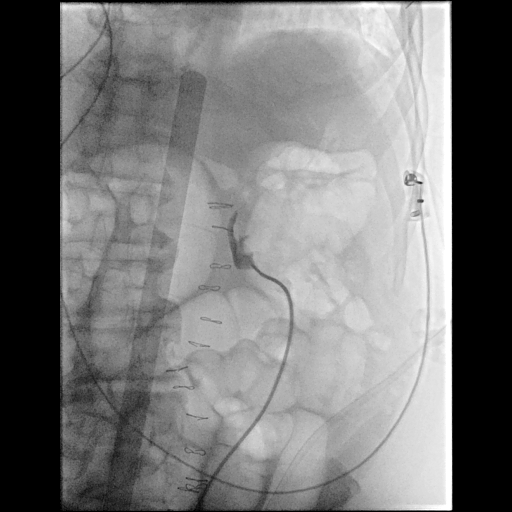
[im 2/21]
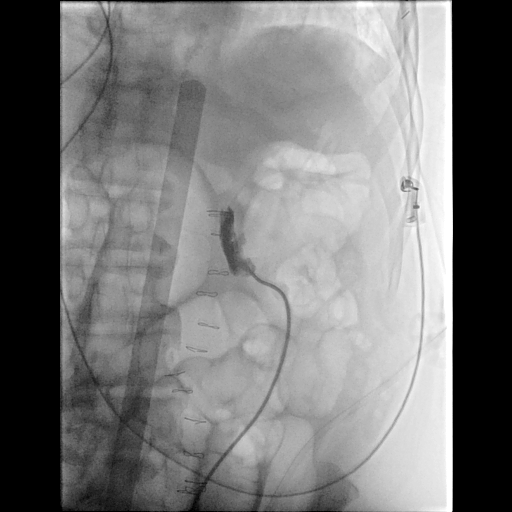
[im 3/21]
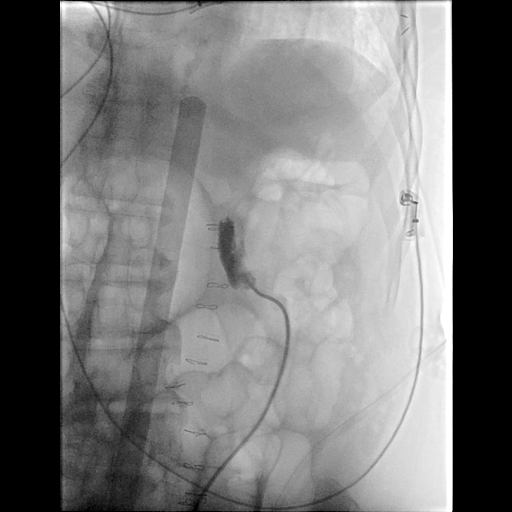
[im 4/21]
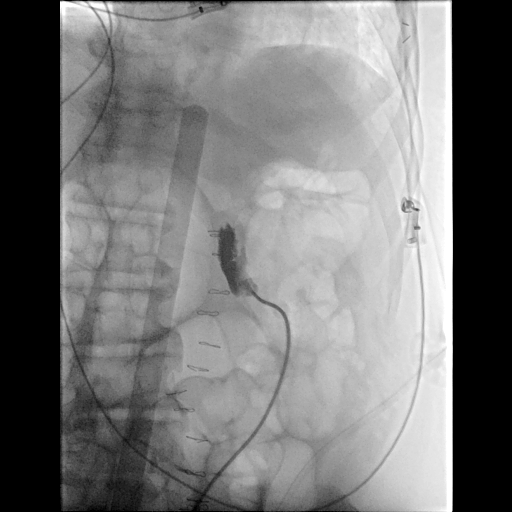
[im 5/21]
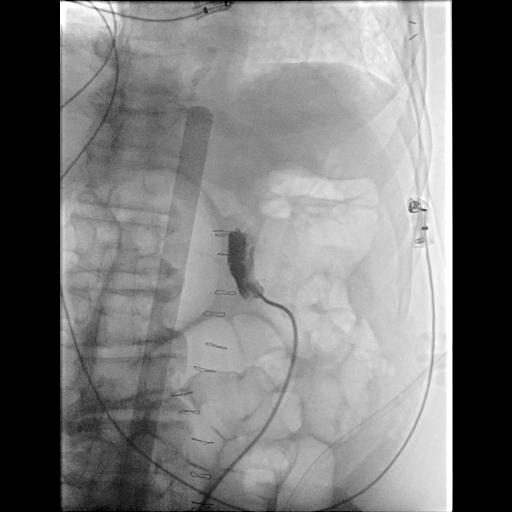
[im 7/21]
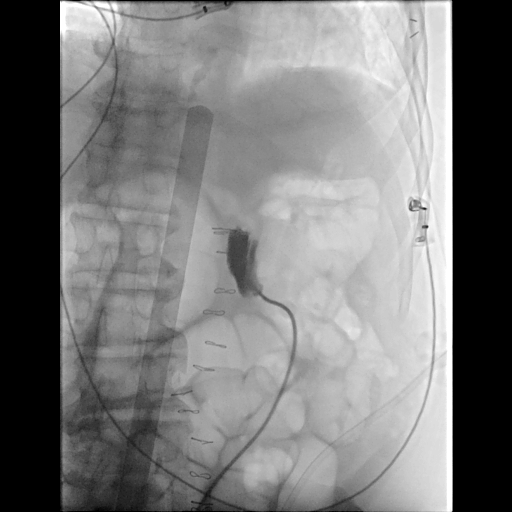
[im 8/21]
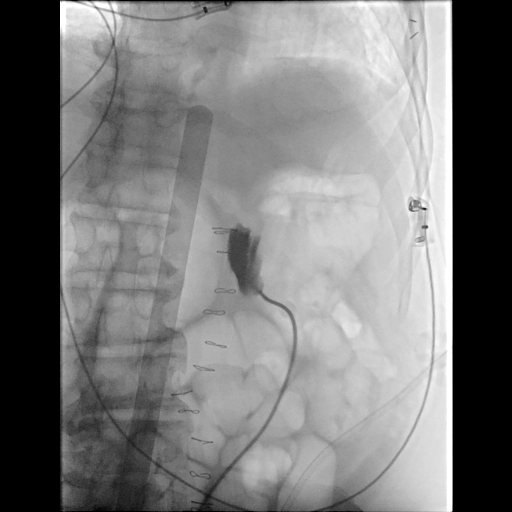
[im 9/21]
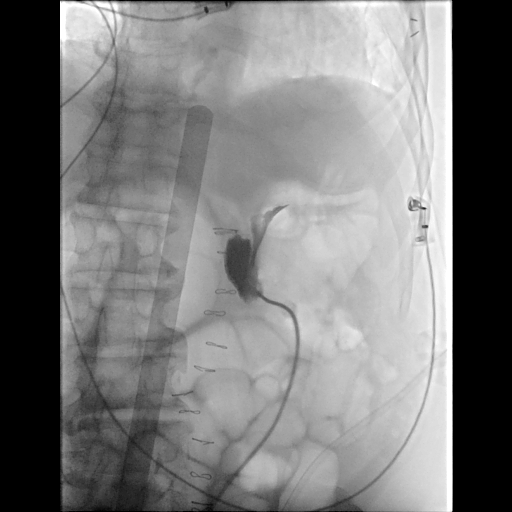
[im 10/21]
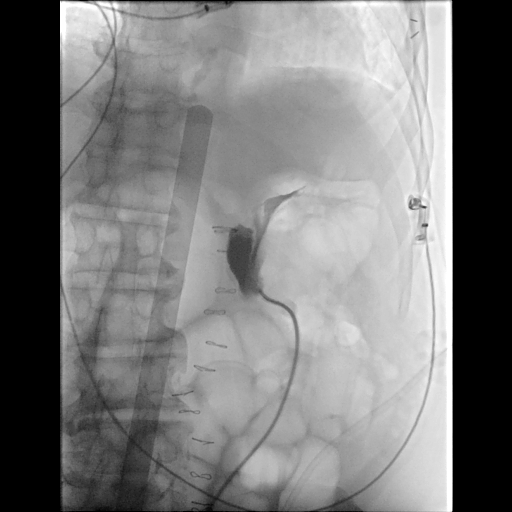
[im 11/21]
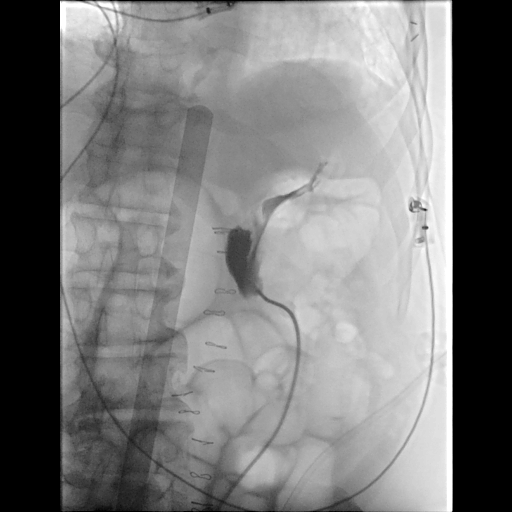
[im 12/21]
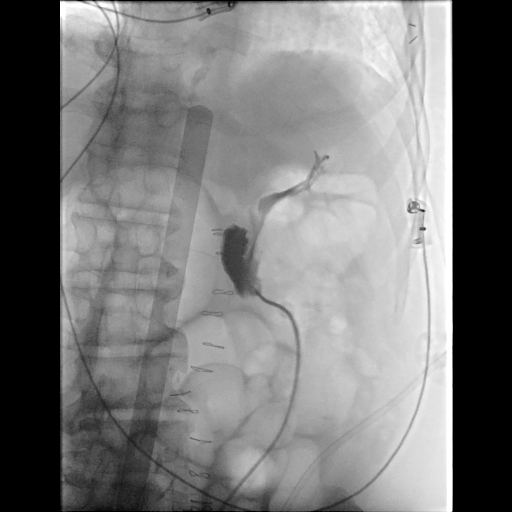
[im 13/21]
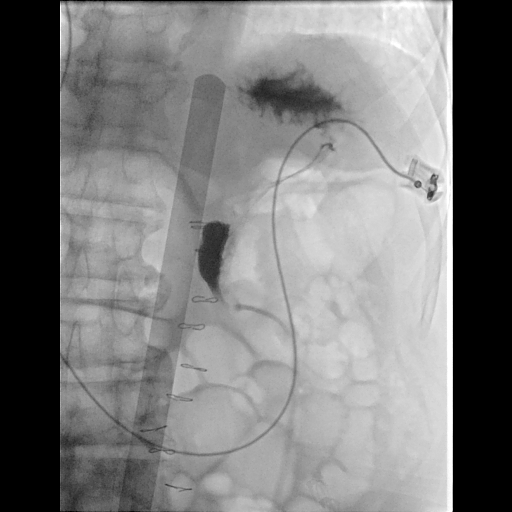
[im 14/21]
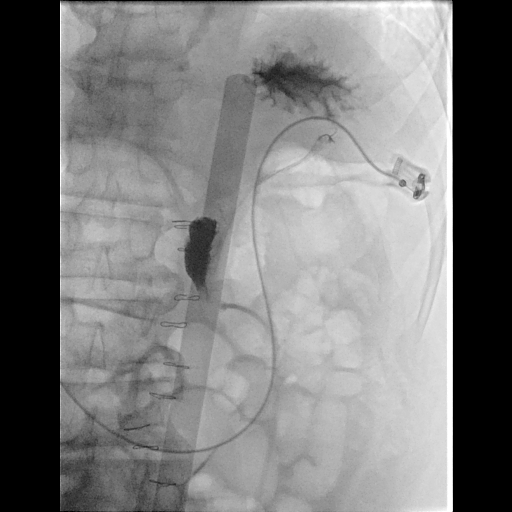
[im 15/21]
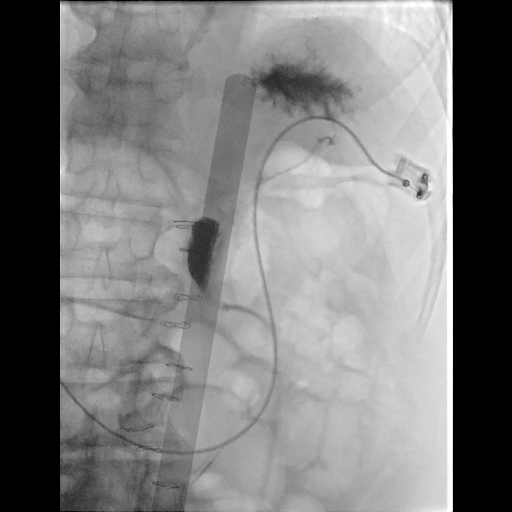
[im 17/21]
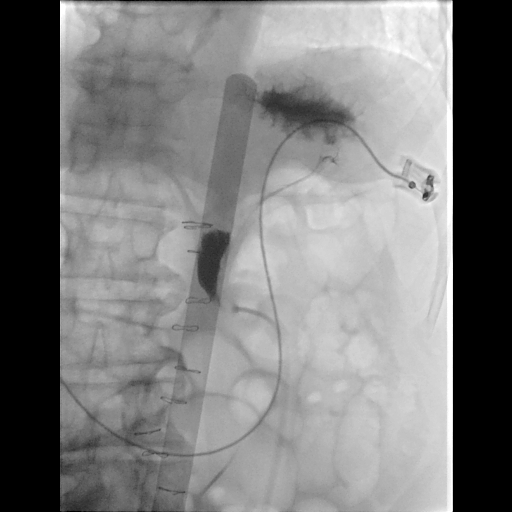
[im 18/21]
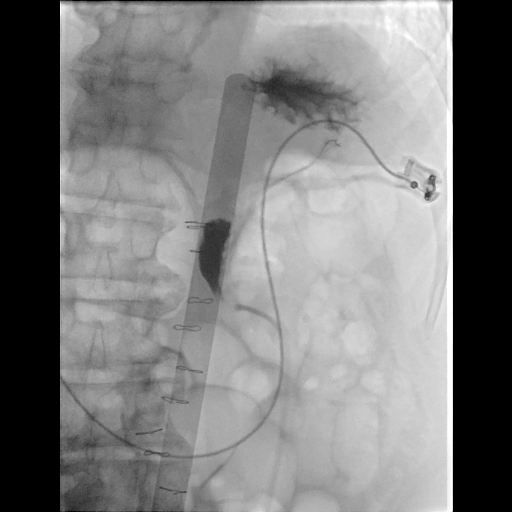
[im 19/21]
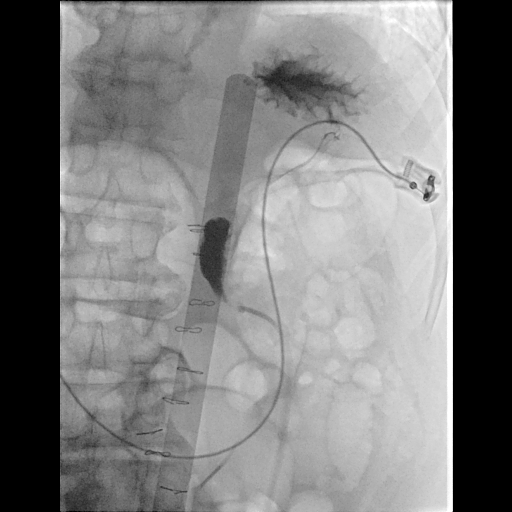
[im 20/21]
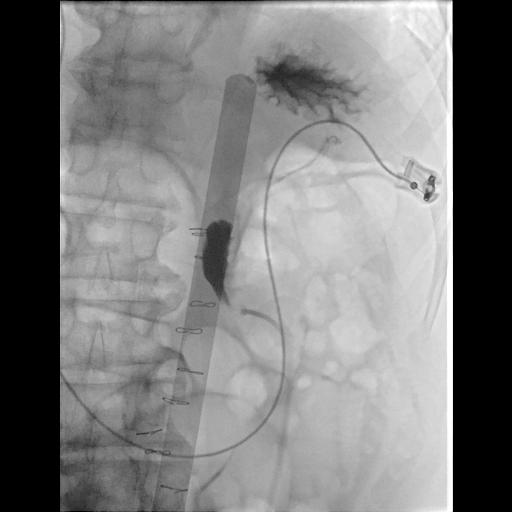
[im 21/21]
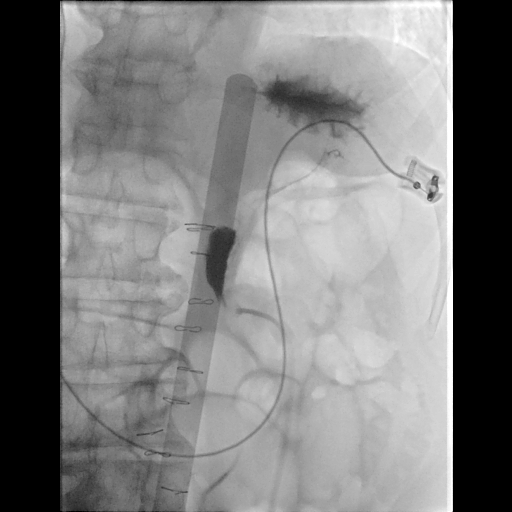

[19 of 21 positions shown; findings below may reference images not displayed]

EXAM:
GASTROSTOMY CATHETER REPLACEMENT

MEDICATIONS:
None

ANESTHESIA/SEDATION:
None

CONTRAST:  10mL OMNIPAQUE IOHEXOL 300 MG/ML SOLN - administered into
the gastric lumen.

FLUOROSCOPY TIME:  Fluoroscopy Time: 0 minutes 6 seconds (3 mGy).

COMPLICATIONS:
None

PROCEDURE:
Informed written consent was obtained from the patient and the
patient's family after a thorough discussion of the procedural
risks, benefits and alternatives. All questions were addressed.
Maximal Sterile Barrier Technique was utilized including caps, mask,
sterile gowns, sterile gloves, sterile drape, hand hygiene and skin
antiseptic. A timeout was performed prior to the initiation of the
procedure.

Bedside exchange of the malfunctioning gastrostomy was performed
with placement of a new balloon retention 16 French into it
gastrostomy.

Contrast injected confirmed location within the stomach with
multiple images.

Patient tolerated the procedure well and remained hemodynamically
stable throughout.

No complications were encountered and no significant blood loss
encountered.
IMPRESSION: Bedside replacement of percutaneous gastrostomy with placement of a
new balloon retention 16 French [employer]

## 2021-09-04 IMAGING — DX DG CHEST 1V PORT
1 series · 1 of 1 positions shown · non-contrast
Comparison: Portable chest 07/14/2020 and earlier.

CLINICAL DATA: 70-year-old male with tracheostomy, pneumonia.

EXAM:
PORTABLE CHEST 1 VIEW

[chest]
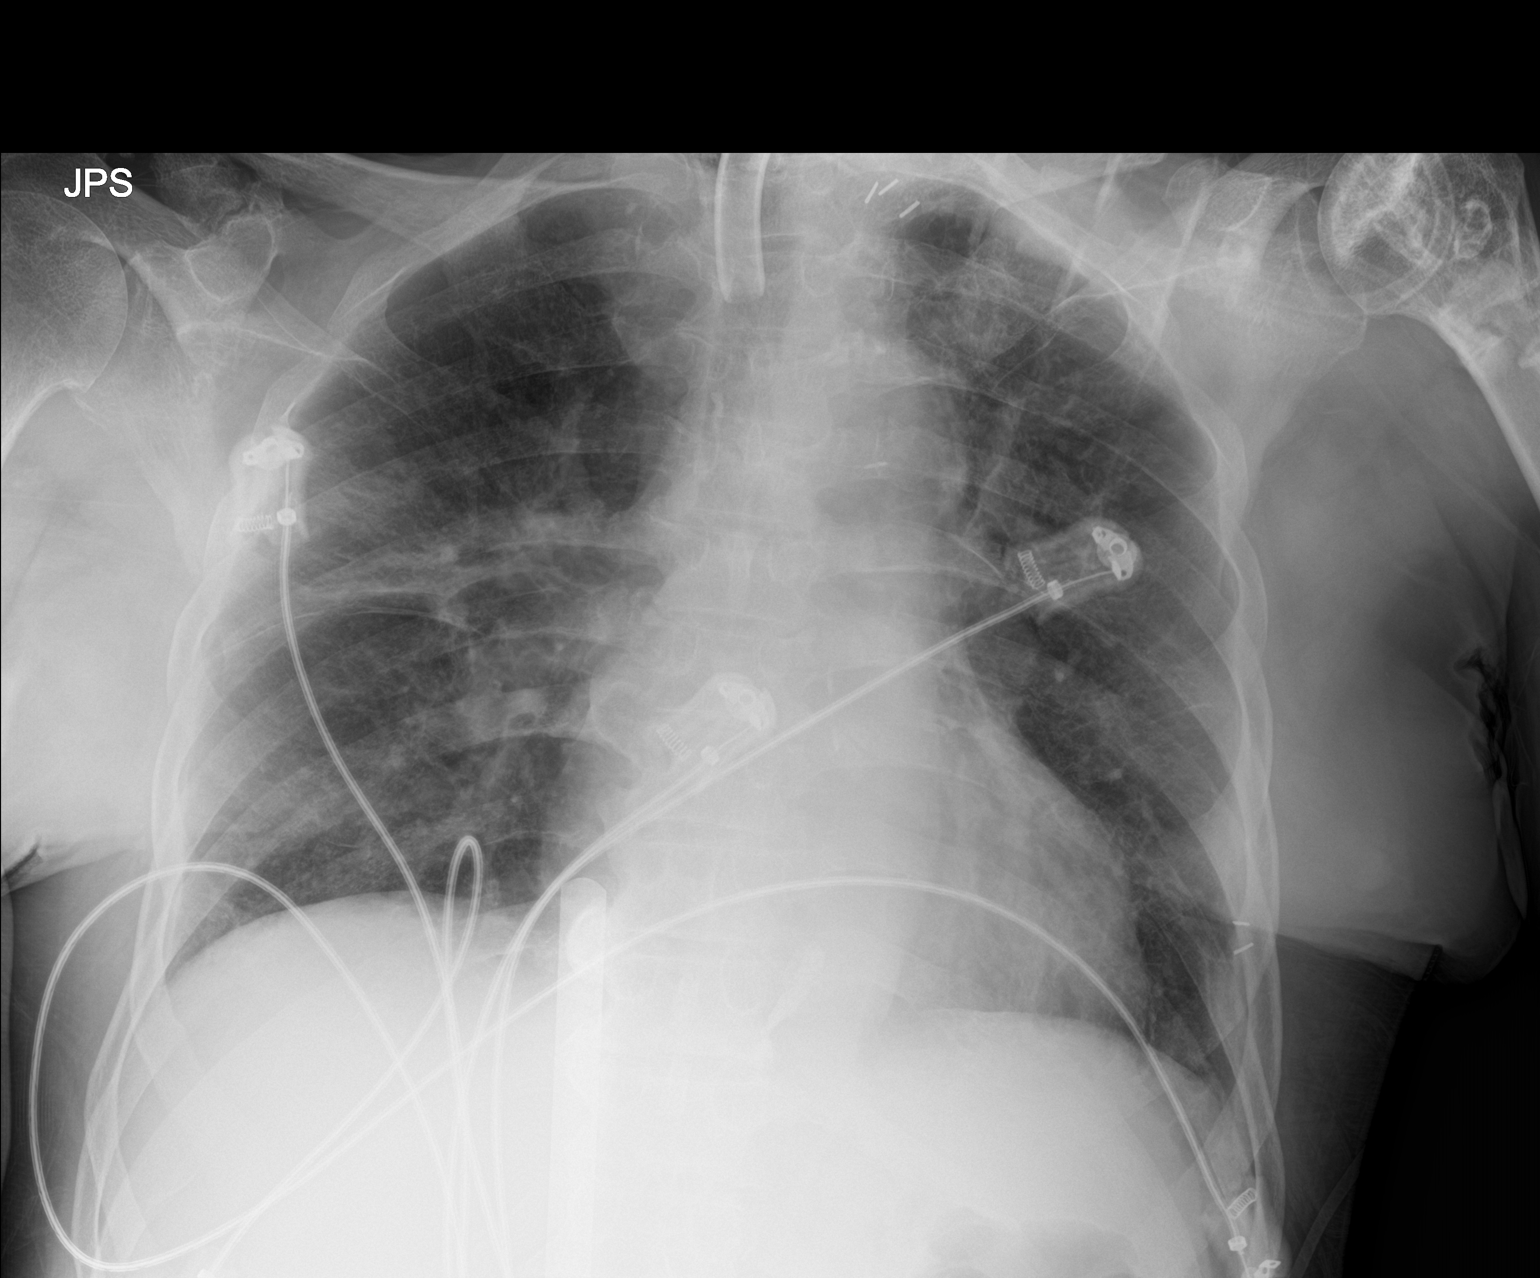

[1 of 1 positions shown; findings below may reference images not displayed]

FINDINGS: Portable AP semi upright view at 1780 hours. Stable tracheostomy
tube, left thoracic inlet surgical clips. Mildly improved lung
volumes from last month. Stable cardiac size and mediastinal
contours. Regressed but not resolved streaky and patchy right mid
and lower lung opacity, right perihilar residual now. No
superimposed pneumothorax, pulmonary edema, pleural effusion. No
areas of worsening ventilation. Abnormal sclerosis of the proximal
left humerus appears stable. No new osseous abnormality identified.
Negative visible bowel gas pattern.
IMPRESSION: 1. Regressed but not resolved patchy right lung opacity, perihilar
residual now.
2. Stable tracheostomy. No new cardiopulmonary abnormality.

## 2021-09-23 IMAGING — DX DG ABD PORTABLE 1V
1 series · 2 of 2 positions shown · non-contrast
Comparison: 08/13/2020

CLINICAL DATA: Impaired nasogastric feeding tube. Initial
encounter.

EXAM:
PORTABLE ABDOMEN - 1 VIEW

[Series 1: abdomen · 0.14mm/px · 2 of 2 slices shown]
[im 1/2]
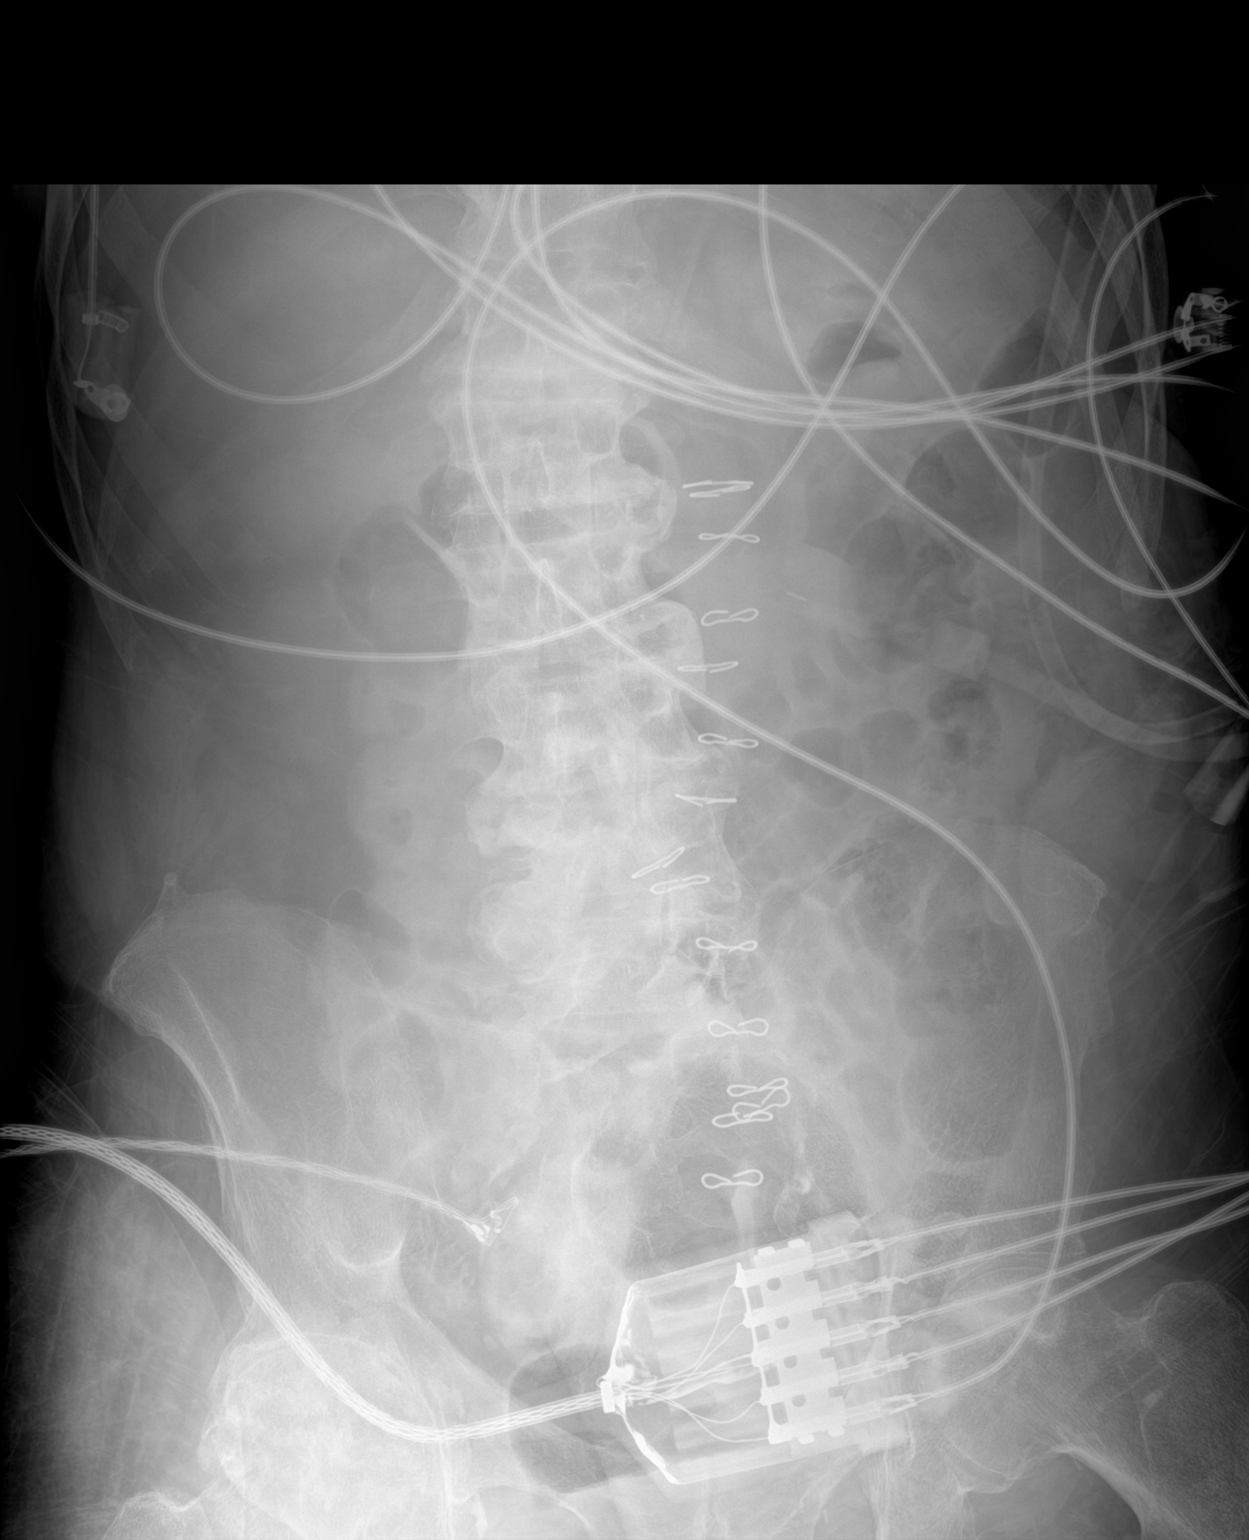
[im 2/2]
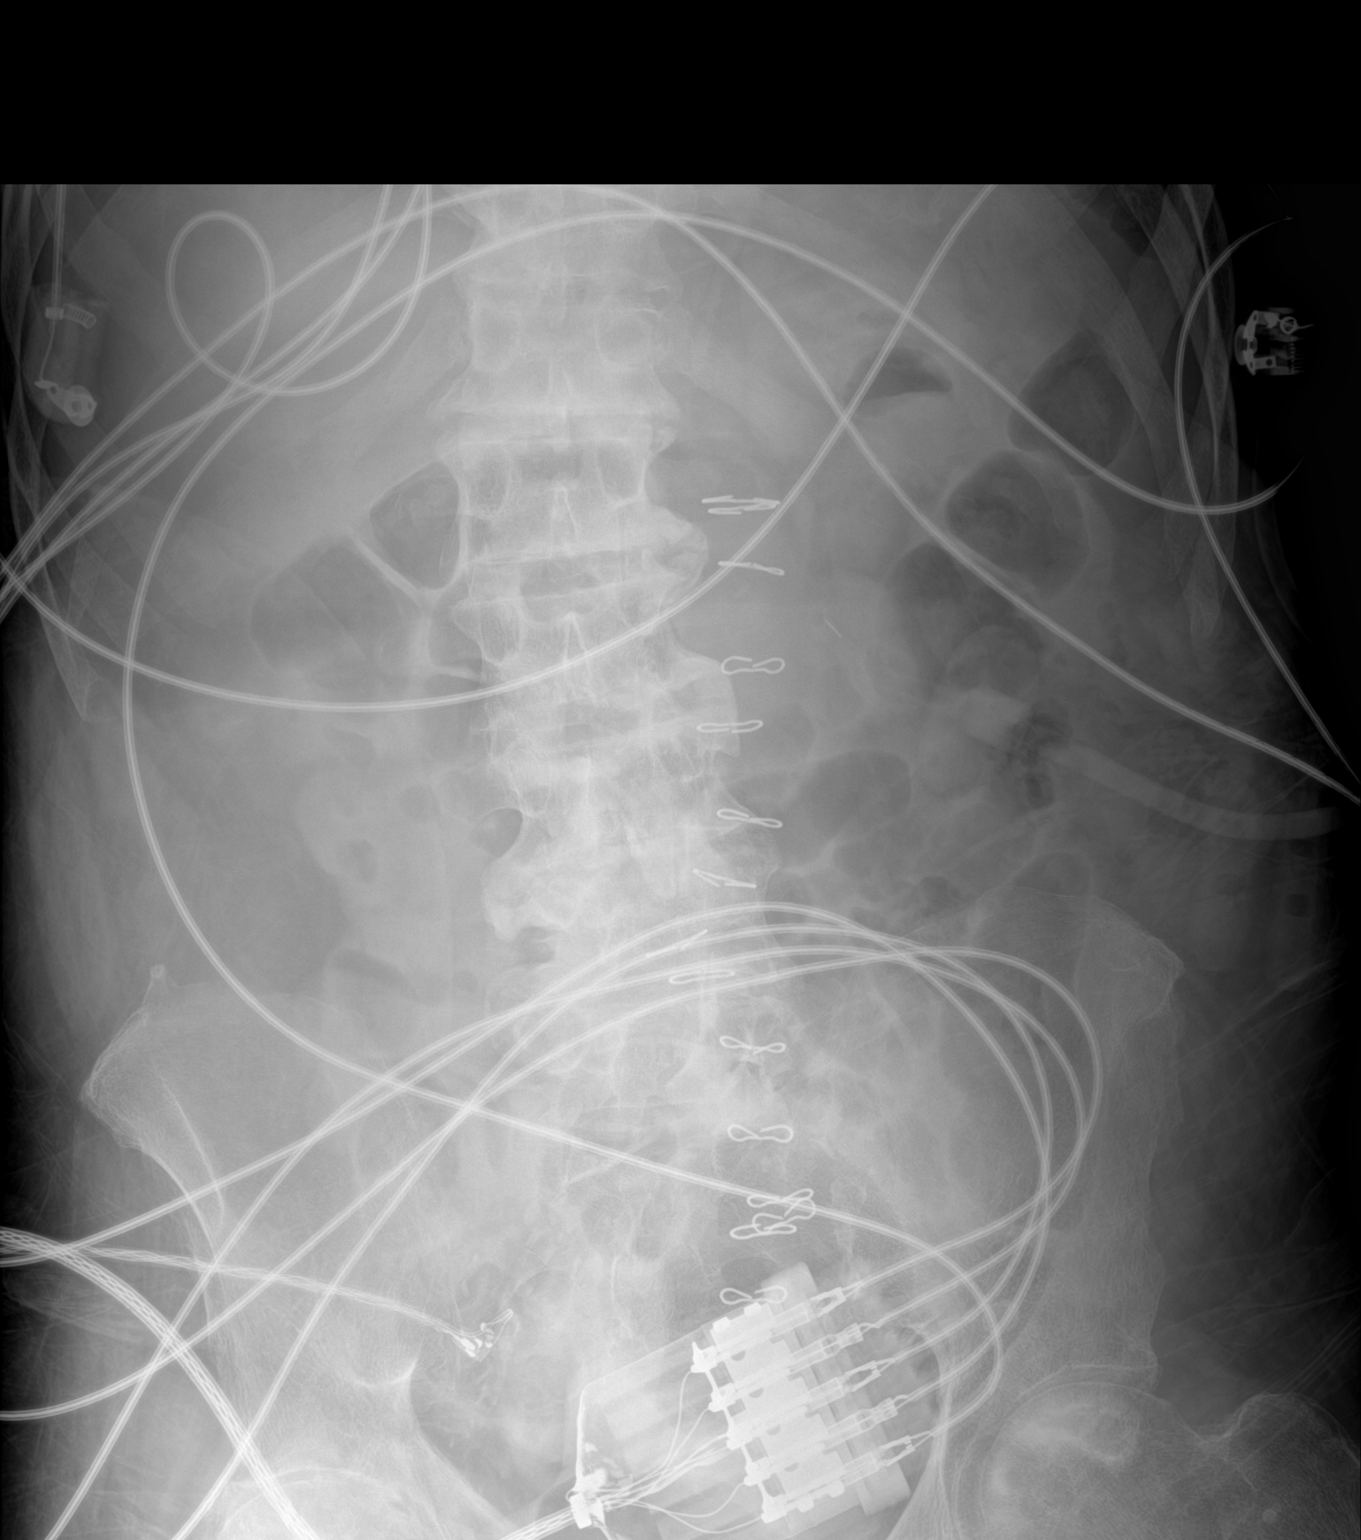

[2 of 2 positions shown; findings below may reference images not displayed]

FINDINGS: Similar appearance of mild gaseous bowel distension without features
of overt obstruction. Degenerative changes noted in the lumbar
spine. Bilateral hip osteoarthritis evident, right greater than left
with apparent osteonecrosis in the left hip.
IMPRESSION: Nonspecific bowel gas pattern, similar to prior. No NG tube
visualized on this study.

## 2021-09-23 IMAGING — DX DG ABDOMEN 1V
1 series · 1 of 1 positions shown · non-contrast
Comparison: None.

CLINICAL DATA: Peg tube location

EXAM:
ABDOMEN - 1 VIEW

[abdomen]
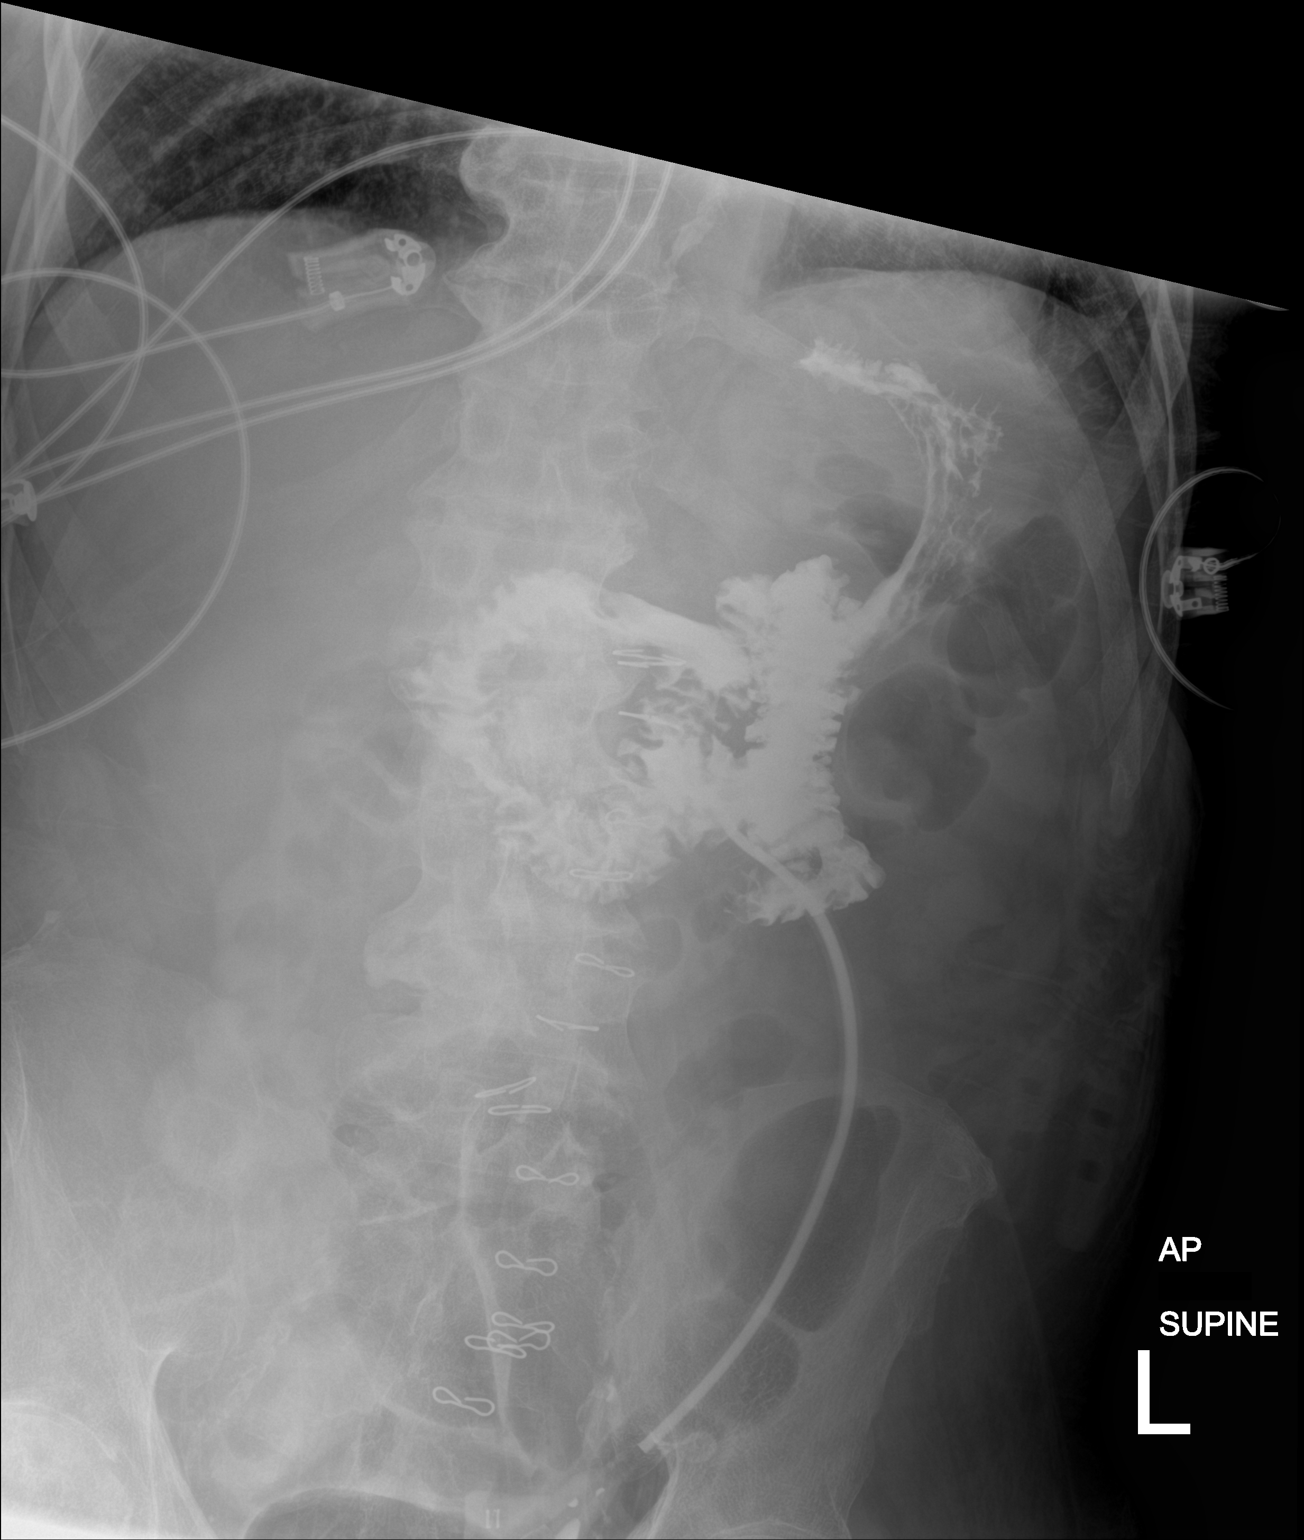

[1 of 1 positions shown; findings below may reference images not displayed]

FINDINGS: Contrast and injected through the PEG tube opacifying the stomach
and proximal small bowel in satisfactory position. No contrast
extravasation. No bowel dilatation to suggest obstruction. No
evidence of pneumoperitoneum, portal venous gas or pneumatosis.

No pathologic calcifications along the expected course of the
ureters.

No acute osseous abnormality.
IMPRESSION: Contrast and injected through the PEG tube opacifying the stomach
and proximal small bowel in satisfactory position.

## 2021-09-25 IMAGING — DX DG ABDOMEN 1V
1 series · 1 of 1 positions shown · non-contrast
Comparison: 08/14/2020

CLINICAL DATA: Peg tube placement

EXAM:
ABDOMEN - 1 VIEW

[abdomen]
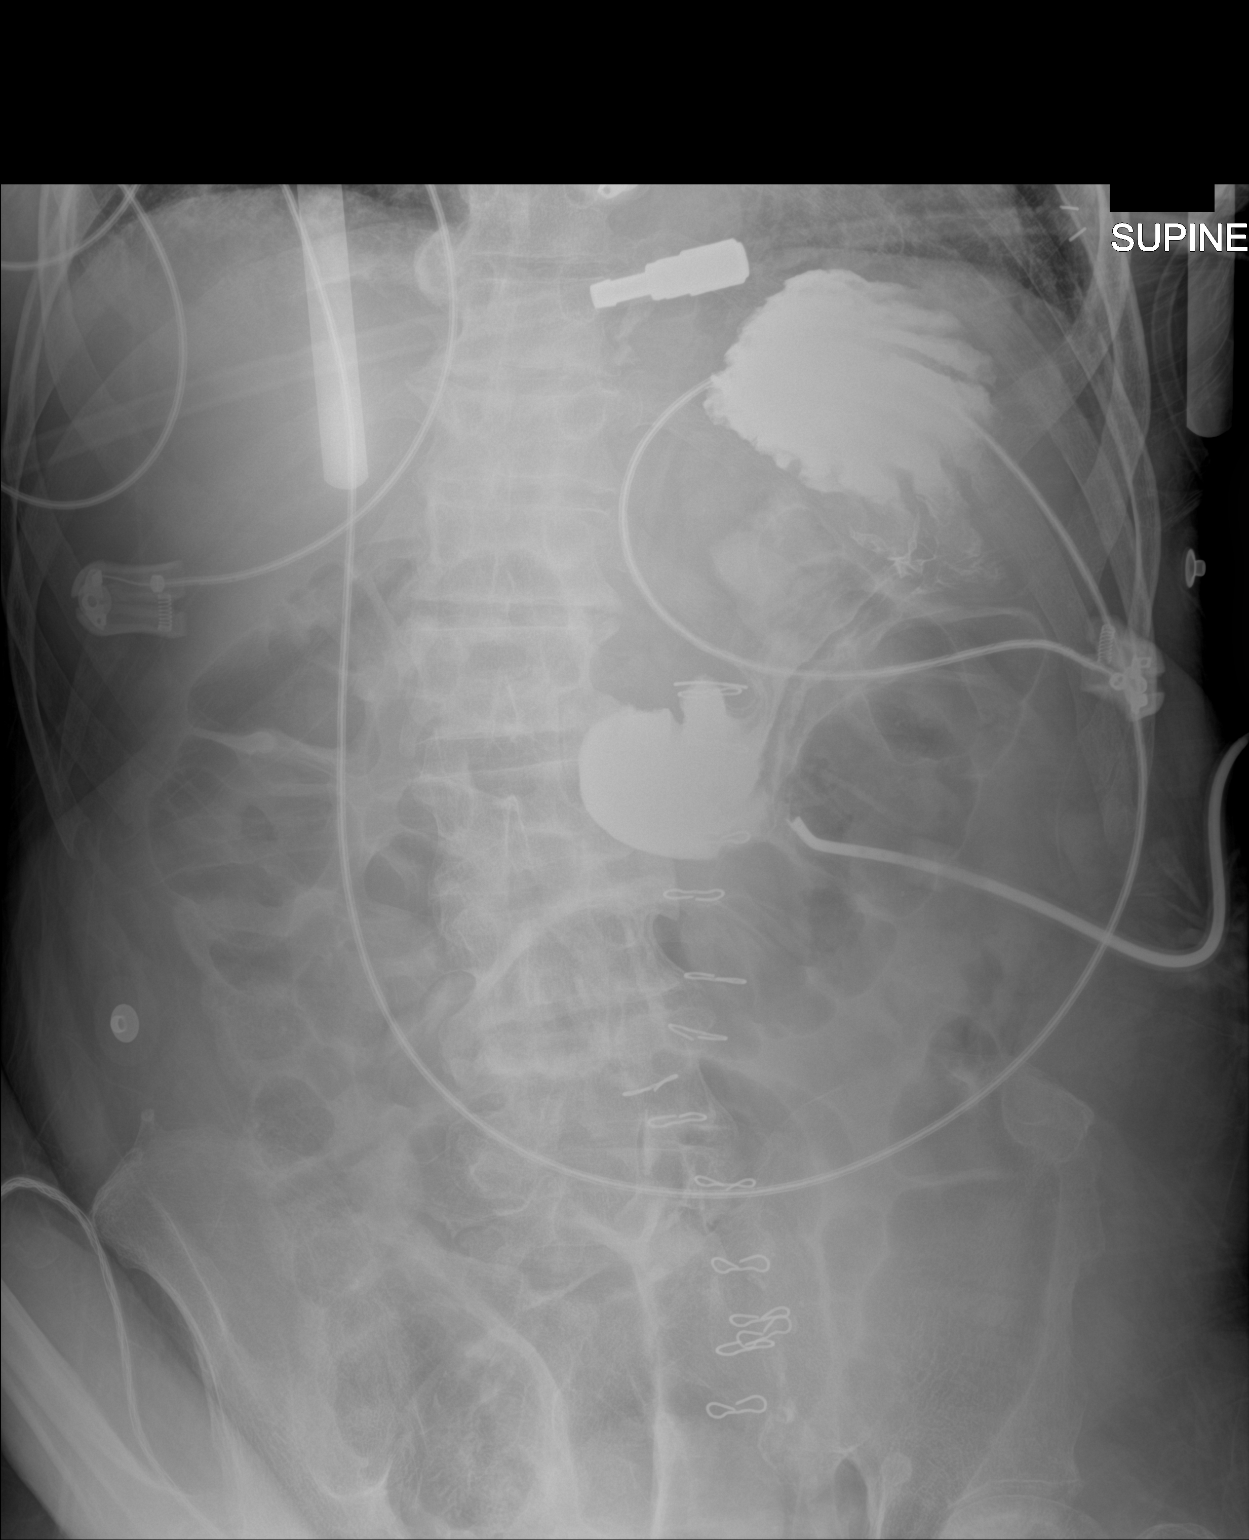

[1 of 1 positions shown; findings below may reference images not displayed]

FINDINGS: Supine frontal view of the abdomen and pelvis was obtained.
Percutaneous gastrostomy tube again identified overlying left upper
quadrant. Oral contrast is seen outlining the gastric rugal folds.
Bowel gas pattern is unremarkable.
IMPRESSION: 1. Percutaneous gastrostomy tube within the stomach, with contrast
outlining the gastric rugal folds.

## 2021-09-29 IMAGING — DX DG ABDOMEN 1V
1 series · 1 of 1 positions shown · IV contrast (omnipaque)
Comparison: 08/16/2020

CLINICAL DATA: Peg tube injected with 30 cc Omnipaque.

EXAM:
ABDOMEN - 1 VIEW

[abdomen supine]
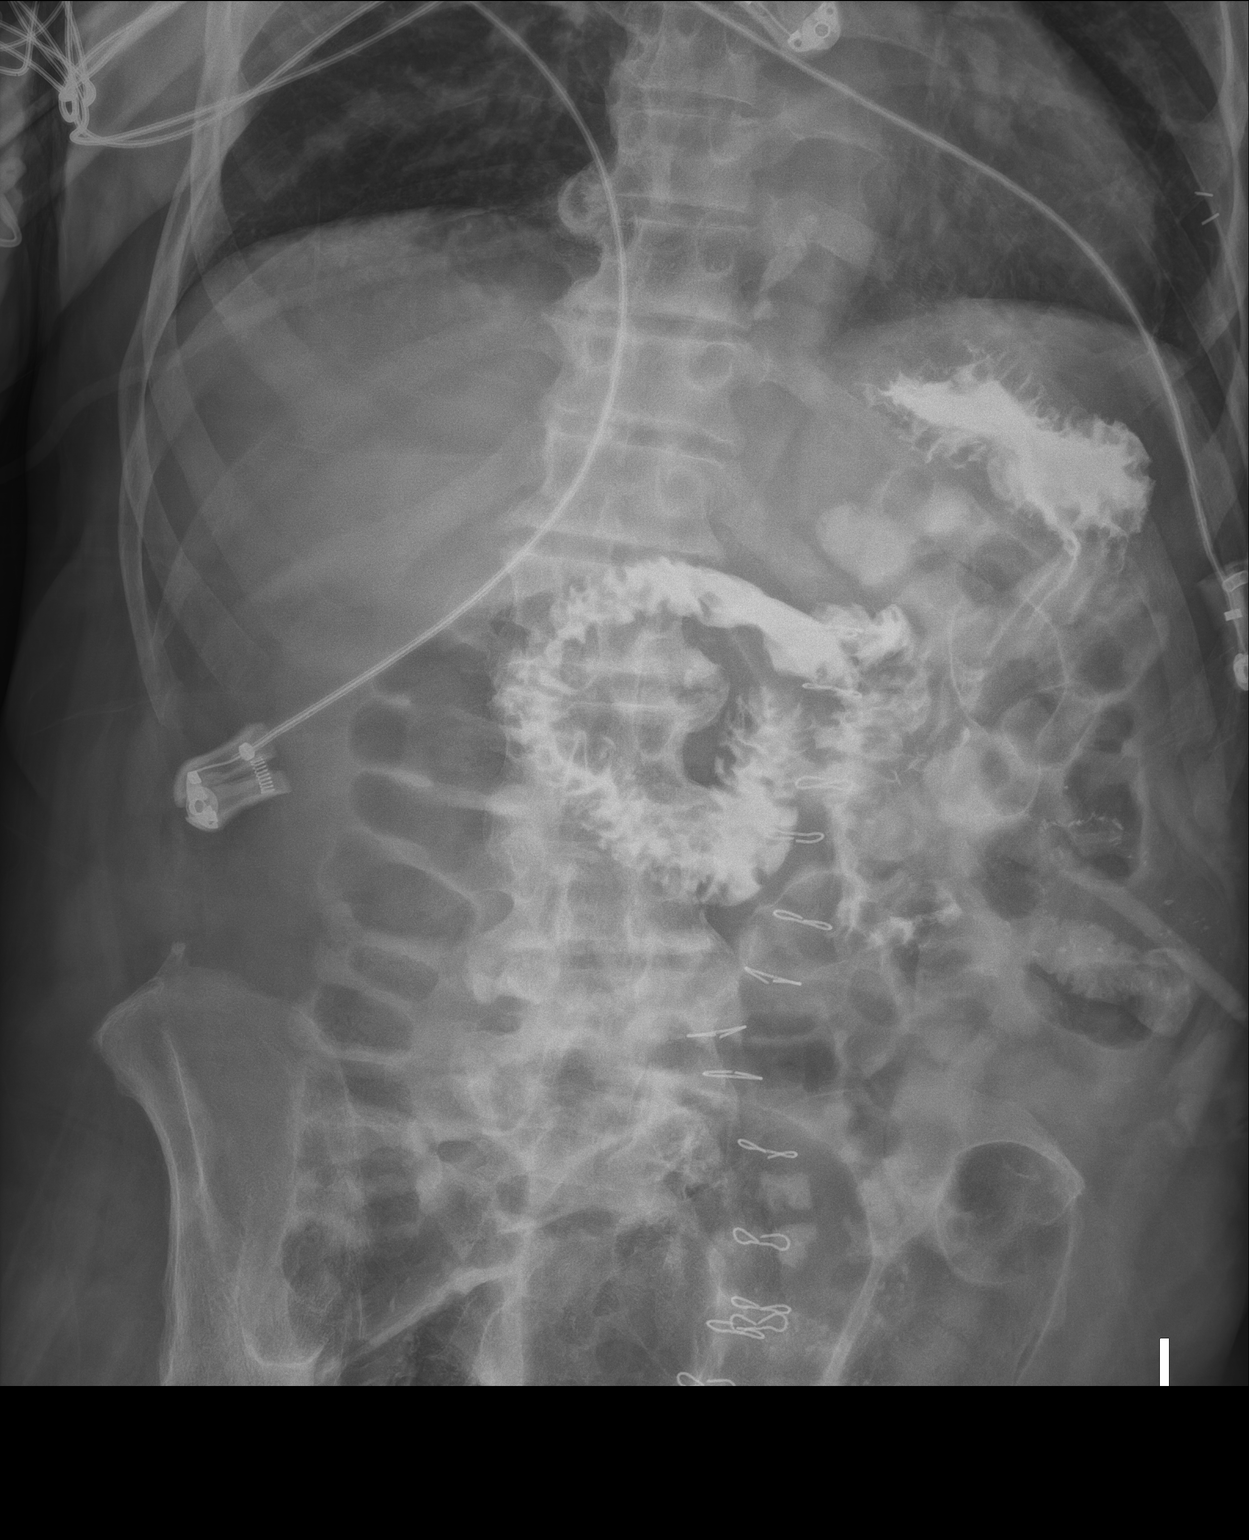

[1 of 1 positions shown; findings below may reference images not displayed]

FINDINGS: Contrast material is seen in the gastric lumen, duodenum, and
proximal small bowel the left upper quadrant. No definite
extravasation free contrast into the peritoneal cavity.
IMPRESSION: PEG tube in the stomach. No definite extravasation of contrast into
the peritoneal cavity.

## 2021-10-24 IMAGING — DX DG ABDOMEN 1V
1 series · 1 of 1 positions shown · non-contrast
Comparison: None.

CLINICAL DATA: Peg tube placement

EXAM:
ABDOMEN - 1 VIEW

[abdomen supine]
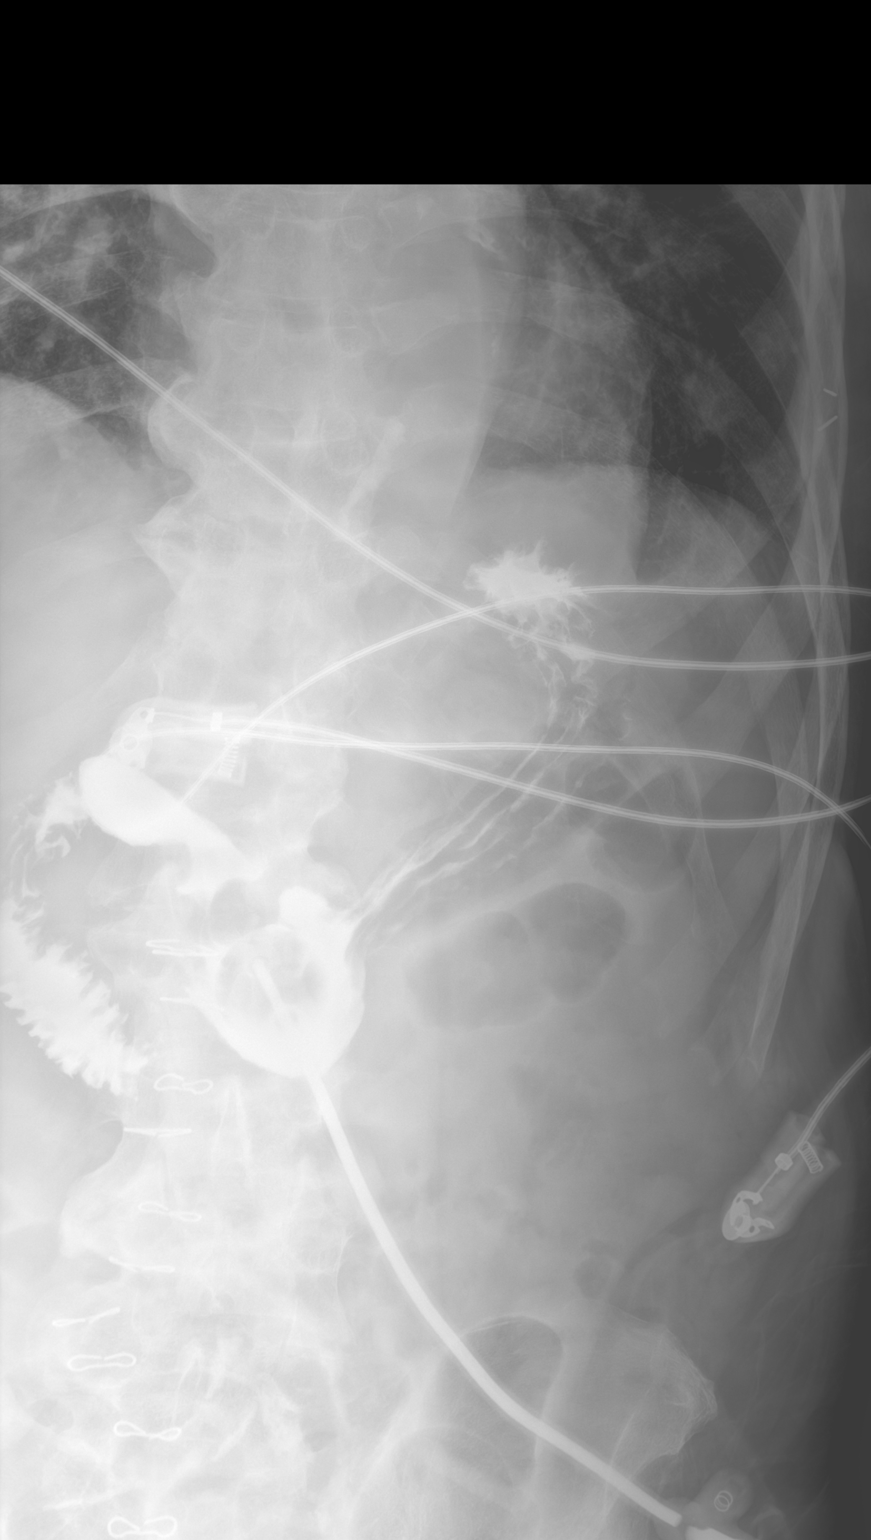

[1 of 1 positions shown; findings below may reference images not displayed]

FINDINGS: Gastrostomy tube is within the stomach. Contrast noted within the
stomach and proximal small bowel.
IMPRESSION: Gastrostomy tube in the stomach.
